# Patient Record
Sex: Female | Born: 1945 | Race: White | Hispanic: No | Smoking: Former smoker
Health system: Southern US, Community
[De-identification: ages and names within clinical notes are randomized; demographics above are authoritative.]

## PROBLEM LIST (undated history)

## (undated) DIAGNOSIS — E785 Hyperlipidemia, unspecified: Secondary | ICD-10-CM

## (undated) DIAGNOSIS — I480 Paroxysmal atrial fibrillation: Secondary | ICD-10-CM

## (undated) DIAGNOSIS — I509 Heart failure, unspecified: Secondary | ICD-10-CM

## (undated) DIAGNOSIS — I4891 Unspecified atrial fibrillation: Secondary | ICD-10-CM

## (undated) DIAGNOSIS — Q231 Congenital insufficiency of aortic valve: Secondary | ICD-10-CM

## (undated) DIAGNOSIS — S02109A Fracture of base of skull, unspecified side, initial encounter for closed fracture: Secondary | ICD-10-CM

## (undated) DIAGNOSIS — I350 Nonrheumatic aortic (valve) stenosis: Secondary | ICD-10-CM

## (undated) DIAGNOSIS — R10811 Right upper quadrant abdominal tenderness: Secondary | ICD-10-CM

## (undated) DIAGNOSIS — Z9889 Other specified postprocedural states: Secondary | ICD-10-CM

## (undated) DIAGNOSIS — J690 Pneumonitis due to inhalation of food and vomit: Secondary | ICD-10-CM

## (undated) DIAGNOSIS — E669 Obesity, unspecified: Secondary | ICD-10-CM

## (undated) DIAGNOSIS — Q230 Congenital stenosis of aortic valve: Secondary | ICD-10-CM

## (undated) DIAGNOSIS — J449 Chronic obstructive pulmonary disease, unspecified: Secondary | ICD-10-CM

## (undated) DIAGNOSIS — Z8679 Personal history of other diseases of the circulatory system: Secondary | ICD-10-CM

## (undated) DIAGNOSIS — I10 Essential (primary) hypertension: Secondary | ICD-10-CM

## (undated) DIAGNOSIS — Z9289 Personal history of other medical treatment: Secondary | ICD-10-CM

## (undated) DIAGNOSIS — H9192 Unspecified hearing loss, left ear: Secondary | ICD-10-CM

## (undated) DIAGNOSIS — I4892 Unspecified atrial flutter: Secondary | ICD-10-CM

## (undated) DIAGNOSIS — J9610 Chronic respiratory failure, unspecified whether with hypoxia or hypercapnia: Secondary | ICD-10-CM

## (undated) DIAGNOSIS — Q2381 Bicuspid aortic valve: Secondary | ICD-10-CM

## (undated) DIAGNOSIS — E039 Hypothyroidism, unspecified: Secondary | ICD-10-CM

## (undated) HISTORY — DX: Pneumonitis due to inhalation of food and vomit: J69.0

## (undated) HISTORY — DX: Hypothyroidism, unspecified: E03.9

## (undated) HISTORY — PX: TUMOR EXCISION: SHX421

## (undated) HISTORY — DX: Essential (primary) hypertension: I10

## (undated) HISTORY — DX: Paroxysmal atrial fibrillation: I48.0

## (undated) HISTORY — DX: Chronic respiratory failure, unspecified whether with hypoxia or hypercapnia: J96.10

## (undated) HISTORY — DX: Unspecified atrial fibrillation: I48.91

## (undated) HISTORY — DX: Other specified postprocedural states: Z98.890

## (undated) HISTORY — DX: Unspecified atrial flutter: I48.92

## (undated) HISTORY — DX: Personal history of other medical treatment: Z92.89

## (undated) HISTORY — DX: Heart failure, unspecified: I50.9

## (undated) HISTORY — PX: CARPAL TUNNEL RELEASE: SHX101

## (undated) HISTORY — DX: Right upper quadrant abdominal tenderness: R10.811

## (undated) HISTORY — PX: ABDOMINAL HYSTERECTOMY: SHX81

## (undated) HISTORY — PX: CARDIAC CATHETERIZATION: SHX172

---

## 2005-05-30 ENCOUNTER — Inpatient Hospital Stay: Payer: Self-pay | Admitting: Internal Medicine

## 2005-05-30 ENCOUNTER — Other Ambulatory Visit: Payer: Self-pay

## 2005-05-31 ENCOUNTER — Other Ambulatory Visit: Payer: Self-pay

## 2005-06-02 ENCOUNTER — Other Ambulatory Visit: Payer: Self-pay

## 2005-07-28 ENCOUNTER — Ambulatory Visit: Payer: Self-pay | Admitting: Internal Medicine

## 2005-08-16 ENCOUNTER — Emergency Department: Payer: Self-pay | Admitting: Unknown Physician Specialty

## 2005-08-19 ENCOUNTER — Ambulatory Visit: Payer: Self-pay | Admitting: Internal Medicine

## 2005-09-11 ENCOUNTER — Ambulatory Visit: Payer: Self-pay | Admitting: Internal Medicine

## 2006-02-12 ENCOUNTER — Ambulatory Visit: Payer: Self-pay | Admitting: Internal Medicine

## 2006-02-26 ENCOUNTER — Ambulatory Visit: Payer: Self-pay | Admitting: Unknown Physician Specialty

## 2006-03-05 ENCOUNTER — Ambulatory Visit: Payer: Self-pay

## 2006-04-21 ENCOUNTER — Ambulatory Visit: Payer: Self-pay | Admitting: Unknown Physician Specialty

## 2006-08-13 ENCOUNTER — Emergency Department: Payer: Self-pay | Admitting: Unknown Physician Specialty

## 2006-09-07 DIAGNOSIS — Z8679 Personal history of other diseases of the circulatory system: Secondary | ICD-10-CM

## 2006-09-07 HISTORY — PX: AORTIC VALVE REPLACEMENT: SHX41

## 2006-09-07 HISTORY — DX: Personal history of other diseases of the circulatory system: Z86.79

## 2006-09-07 HISTORY — PX: ABDOMINAL AORTIC ANEURYSM REPAIR: SUR1152

## 2006-10-14 ENCOUNTER — Ambulatory Visit: Payer: Self-pay | Admitting: Unknown Physician Specialty

## 2006-10-25 ENCOUNTER — Emergency Department: Payer: Self-pay | Admitting: Emergency Medicine

## 2007-02-14 ENCOUNTER — Ambulatory Visit: Payer: Self-pay | Admitting: Dermatology

## 2007-03-01 ENCOUNTER — Ambulatory Visit: Payer: Self-pay | Admitting: Internal Medicine

## 2007-06-08 ENCOUNTER — Ambulatory Visit: Payer: Self-pay | Admitting: Internal Medicine

## 2007-06-12 ENCOUNTER — Emergency Department: Payer: Self-pay | Admitting: Emergency Medicine

## 2008-06-12 ENCOUNTER — Encounter: Payer: Self-pay | Admitting: Internal Medicine

## 2008-07-08 ENCOUNTER — Encounter: Payer: Self-pay | Admitting: Internal Medicine

## 2008-08-07 ENCOUNTER — Encounter: Payer: Self-pay | Admitting: Internal Medicine

## 2008-12-15 ENCOUNTER — Emergency Department: Payer: Self-pay | Admitting: Unknown Physician Specialty

## 2010-12-09 ENCOUNTER — Ambulatory Visit: Payer: Self-pay

## 2011-03-30 ENCOUNTER — Ambulatory Visit: Payer: Self-pay | Admitting: Unknown Physician Specialty

## 2011-04-24 ENCOUNTER — Ambulatory Visit: Payer: Self-pay | Admitting: Unknown Physician Specialty

## 2011-04-29 LAB — PATHOLOGY REPORT

## 2011-05-08 ENCOUNTER — Emergency Department: Payer: Self-pay | Admitting: Emergency Medicine

## 2011-10-15 ENCOUNTER — Emergency Department: Payer: Self-pay | Admitting: Unknown Physician Specialty

## 2011-10-16 LAB — COMPREHENSIVE METABOLIC PANEL
Albumin: 3.7 g/dL (ref 3.4–5.0)
Alkaline Phosphatase: 73 U/L (ref 50–136)
Anion Gap: 15 (ref 7–16)
BUN: 13 mg/dL (ref 7–18)
Bilirubin,Total: 0.3 mg/dL (ref 0.2–1.0)
Calcium, Total: 9.2 mg/dL (ref 8.5–10.1)
Chloride: 97 mmol/L — ABNORMAL LOW (ref 98–107)
Co2: 26 mmol/L (ref 21–32)
Creatinine: 0.83 mg/dL (ref 0.60–1.30)
EGFR (African American): >60
EGFR (Non-African Amer.): >60
Glucose: 125 mg/dL — ABNORMAL HIGH (ref 65–99)
Osmolality: 277 (ref 275–301)
Potassium: 3.2 mmol/L — ABNORMAL LOW (ref 3.5–5.1)
SGOT(AST): 19 U/L (ref 15–37)
SGPT (ALT): 21 U/L
Sodium: 138 mmol/L (ref 136–145)
Total Protein: 7.5 g/dL (ref 6.4–8.2)

## 2011-10-16 LAB — CBC
HCT: 33.6 % — ABNORMAL LOW (ref 35.0–47.0)
HGB: 11.2 g/dL — ABNORMAL LOW (ref 12.0–16.0)
MCH: 27 pg (ref 26.0–34.0)
MCHC: 33.3 g/dL (ref 32.0–36.0)
MCV: 81 fL (ref 80–100)
Platelet: 269 10*3/uL (ref 150–440)
RBC: 4.15 10*6/uL (ref 3.80–5.20)
RDW: 17.5 % — ABNORMAL HIGH (ref 11.5–14.5)
WBC: 8.9 10*3/uL (ref 3.6–11.0)

## 2011-10-16 LAB — URINALYSIS, COMPLETE
Bacteria: NONE SEEN
Bilirubin,UR: NEGATIVE
Blood: NEGATIVE
Glucose,UR: NEGATIVE mg/dL (ref 0–75)
Ketone: NEGATIVE
Nitrite: NEGATIVE
Ph: 7 (ref 4.5–8.0)
Protein: NEGATIVE
RBC,UR: 1 /HPF (ref 0–5)
Specific Gravity: 1.009 (ref 1.003–1.030)
Squamous Epithelial: 3
WBC UR: 3 /HPF (ref 0–5)

## 2011-10-16 LAB — LIPASE, BLOOD: Lipase: 124 U/L (ref 73–393)

## 2011-11-06 ENCOUNTER — Ambulatory Visit: Payer: Self-pay | Admitting: Unknown Physician Specialty

## 2012-03-22 ENCOUNTER — Ambulatory Visit: Payer: Self-pay | Admitting: Specialist

## 2012-03-22 LAB — POTASSIUM: Potassium: 3.5 mmol/L (ref 3.5–5.1)

## 2012-03-22 LAB — HEMOGLOBIN: HGB: 11.5 g/dL — ABNORMAL LOW (ref 12.0–16.0)

## 2012-03-30 ENCOUNTER — Ambulatory Visit: Payer: Self-pay | Admitting: Specialist

## 2012-03-31 LAB — PATHOLOGY REPORT

## 2012-05-13 ENCOUNTER — Ambulatory Visit: Payer: Self-pay | Admitting: Internal Medicine

## 2012-06-07 ENCOUNTER — Ambulatory Visit: Payer: Self-pay | Admitting: Internal Medicine

## 2012-06-24 LAB — CBC CANCER CENTER
Basophil #: 0 x10 3/mm (ref 0.0–0.1)
Basophil %: 0.6 %
Eosinophil #: 0.1 x10 3/mm (ref 0.0–0.7)
Eosinophil %: 1.5 %
HCT: 35.4 % (ref 35.0–47.0)
HGB: 11.3 g/dL — ABNORMAL LOW (ref 12.0–16.0)
Lymphocyte #: 1.9 x10 3/mm (ref 1.0–3.6)
Lymphocyte %: 27.4 %
MCH: 26.2 pg (ref 26.0–34.0)
MCHC: 31.9 g/dL — ABNORMAL LOW (ref 32.0–36.0)
MCV: 82 fL (ref 80–100)
Monocyte #: 0.6 x10 3/mm (ref 0.2–0.9)
Monocyte %: 9.2 %
Neutrophil #: 4.2 x10 3/mm (ref 1.4–6.5)
Neutrophil %: 61.3 %
Platelet: 253 x10 3/mm (ref 150–440)
RBC: 4.3 10*6/uL (ref 3.80–5.20)
RDW: 17.2 % — ABNORMAL HIGH (ref 11.5–14.5)
WBC: 6.8 x10 3/mm (ref 3.6–11.0)

## 2012-06-24 LAB — IRON AND TIBC
Iron Bind.Cap.(Total): 396 ug/dL
Iron Saturation: 15 %
Iron: 60 ug/dL
Unbound Iron-Bind.Cap.: 336 ug/dL

## 2012-06-24 LAB — RETICULOCYTES
Absolute Retic Count: 0.0953 x10 6/uL
Reticulocyte: 2.21 % — ABNORMAL HIGH

## 2012-06-24 LAB — FERRITIN: Ferritin (ARMC): 31 ng/mL

## 2012-07-08 ENCOUNTER — Ambulatory Visit: Payer: Self-pay | Admitting: Internal Medicine

## 2012-07-08 ENCOUNTER — Ambulatory Visit: Payer: Self-pay

## 2012-08-14 ENCOUNTER — Emergency Department: Payer: Self-pay | Admitting: Internal Medicine

## 2012-08-14 LAB — COMPREHENSIVE METABOLIC PANEL
Albumin: 3.5 g/dL (ref 3.4–5.0)
Alkaline Phosphatase: 97 U/L (ref 50–136)
Anion Gap: 8 (ref 7–16)
BUN: 8 mg/dL (ref 7–18)
Bilirubin,Total: 0.3 mg/dL (ref 0.2–1.0)
Calcium, Total: 8.8 mg/dL (ref 8.5–10.1)
Chloride: 102 mmol/L (ref 98–107)
Co2: 28 mmol/L (ref 21–32)
Creatinine: 0.72 mg/dL (ref 0.60–1.30)
EGFR (African American): >60
EGFR (Non-African Amer.): >60
Glucose: 74 mg/dL (ref 65–99)
Osmolality: 273 (ref 275–301)
Potassium: 3.3 mmol/L — ABNORMAL LOW (ref 3.5–5.1)
SGOT(AST): 21 U/L (ref 15–37)
SGPT (ALT): 20 U/L (ref 12–78)
Sodium: 138 mmol/L (ref 136–145)
Total Protein: 7.1 g/dL (ref 6.4–8.2)

## 2012-08-14 LAB — CBC
HCT: 33.6 % — ABNORMAL LOW (ref 35.0–47.0)
HGB: 11.2 g/dL — ABNORMAL LOW (ref 12.0–16.0)
MCH: 27.4 pg (ref 26.0–34.0)
MCHC: 33.5 g/dL (ref 32.0–36.0)
MCV: 82 fL (ref 80–100)
Platelet: 231 10*3/uL (ref 150–440)
RBC: 4.1 10*6/uL (ref 3.80–5.20)
RDW: 17.5 % — ABNORMAL HIGH (ref 11.5–14.5)
WBC: 7.8 10*3/uL (ref 3.6–11.0)

## 2012-08-14 LAB — TROPONIN I: Troponin-I: <0.02 ng/mL

## 2012-08-14 LAB — CK TOTAL AND CKMB (NOT AT ARMC)
CK, Total: 107 U/L (ref 21–215)
CK-MB: 0.7 ng/mL (ref 0.5–3.6)

## 2013-06-23 DIAGNOSIS — I7121 Aneurysm of the ascending aorta, without rupture: Secondary | ICD-10-CM | POA: Insufficient documentation

## 2013-06-23 DIAGNOSIS — I712 Thoracic aortic aneurysm, without rupture: Secondary | ICD-10-CM | POA: Insufficient documentation

## 2013-06-23 DIAGNOSIS — Q231 Congenital insufficiency of aortic valve: Secondary | ICD-10-CM | POA: Insufficient documentation

## 2013-07-25 ENCOUNTER — Encounter: Payer: Self-pay | Admitting: Internal Medicine

## 2013-08-07 ENCOUNTER — Encounter: Payer: Self-pay | Admitting: Internal Medicine

## 2013-09-07 ENCOUNTER — Encounter: Payer: Self-pay | Admitting: Internal Medicine

## 2014-02-13 DIAGNOSIS — L0291 Cutaneous abscess, unspecified: Secondary | ICD-10-CM | POA: Insufficient documentation

## 2014-02-13 DIAGNOSIS — A4902 Methicillin resistant Staphylococcus aureus infection, unspecified site: Secondary | ICD-10-CM | POA: Insufficient documentation

## 2014-02-13 DIAGNOSIS — L0390 Cellulitis, unspecified: Secondary | ICD-10-CM

## 2014-04-23 ENCOUNTER — Ambulatory Visit: Payer: Self-pay

## 2014-05-18 DIAGNOSIS — M7542 Impingement syndrome of left shoulder: Secondary | ICD-10-CM | POA: Insufficient documentation

## 2014-05-18 DIAGNOSIS — M5032 Other cervical disc degeneration, mid-cervical region, unspecified level: Secondary | ICD-10-CM | POA: Insufficient documentation

## 2014-05-18 DIAGNOSIS — M7540 Impingement syndrome of unspecified shoulder: Secondary | ICD-10-CM | POA: Insufficient documentation

## 2014-12-25 NOTE — Op Note (Signed)
PATIENT NAME:  [name], [name] MR#:  [id number] DATE OF BIRTH:  1945/12/17  DATE OF PROCEDURE:  03/30/2012  PREOPERATIVE DIAGNOSIS: Mass distal volar left forearm.   POSTOPERATIVE DIAGNOSIS: Mass distal volar left forearm, scar tissue surrounding median nerve.   PROCEDURES:  1. Excision of mass, distal volar left forearm.  2. Neurolysis of the median nerve.   SURGEON: Christophe Louis, M.D.   ANESTHESIA: General.   COMPLICATIONS: None.   TOURNIQUET TIME: Approximately 60 minutes.   DESCRIPTION OF PROCEDURE: One gram of Ancef was given intravenously prior to the procedure. General anesthesia is induced. The left upper extremity is thoroughly prepped with alcohol and ChloraPrep and draped in standard sterile fashion. The extremity is wrapped out with the Esmarch bandage and pneumatic tourniquet elevated to 250 mmHg. Under loupe magnification, the original incision in the volar distal left forearm is extended distally and the dissection very carefully carried down to the median nerve. There is seen to be some scarring about the median nerve and this is completely released under direct vision. The mass is mainly seen to be fatty type tissue and I cannot see any distinct evidence of residual tumor. This is all completely excised and sent to pathology. The incision is then extended proximally and some scar tissue is removed and sent to pathology. Careful palpation and visual inspection demonstrates no evidence of any residual pathology. The wound is thoroughly irrigated multiple times. Skin edges are infiltrated with 0.5% plain Marcaine. The skin is closed with a running      subcuticular 3-0 Prolene. A soft bulky dressing with a volar splint is applied. The tourniquet is released, and the patient is returned to the recovery room in satisfactory condition having tolerated the procedure quite well.  ____________________________ Lucas Mallow, MD ces:slb D: 03/30/2012 08:54:58  ET T: 03/30/2012 10:01:23 ET JOB#: [id number]  cc: Lucas Mallow, MD, <Dictator> Brendolyn Patty, MD [name], MD Lucas Mallow MD ELECTRONICALLY SIGNED 03/31/2012 14:16

## 2015-01-10 ENCOUNTER — Inpatient Hospital Stay
Admission: EM | Admit: 2015-01-10 | Discharge: 2015-01-11 | DRG: 309 | Disposition: A | Discharge status: Home / Self Care | Payer: Medicare Other | Attending: Specialist | Admitting: Specialist

## 2015-01-10 ENCOUNTER — Other Ambulatory Visit: Payer: Self-pay

## 2015-01-10 ENCOUNTER — Encounter: Payer: Self-pay | Admitting: *Deleted

## 2015-01-10 DIAGNOSIS — T501X5A Adverse effect of loop [high-ceiling] diuretics, initial encounter: Secondary | ICD-10-CM | POA: Diagnosis present

## 2015-01-10 DIAGNOSIS — Z7982 Long term (current) use of aspirin: Secondary | ICD-10-CM

## 2015-01-10 DIAGNOSIS — J9610 Chronic respiratory failure, unspecified whether with hypoxia or hypercapnia: Secondary | ICD-10-CM | POA: Diagnosis present

## 2015-01-10 DIAGNOSIS — Z888 Allergy status to other drugs, medicaments and biological substances status: Secondary | ICD-10-CM

## 2015-01-10 DIAGNOSIS — Z6835 Body mass index (BMI) 35.0-35.9, adult: Secondary | ICD-10-CM

## 2015-01-10 DIAGNOSIS — F419 Anxiety disorder, unspecified: Secondary | ICD-10-CM | POA: Diagnosis present

## 2015-01-10 DIAGNOSIS — Z953 Presence of xenogenic heart valve: Secondary | ICD-10-CM

## 2015-01-10 DIAGNOSIS — Z87891 Personal history of nicotine dependence: Secondary | ICD-10-CM

## 2015-01-10 DIAGNOSIS — J449 Chronic obstructive pulmonary disease, unspecified: Secondary | ICD-10-CM | POA: Diagnosis present

## 2015-01-10 DIAGNOSIS — E876 Hypokalemia: Secondary | ICD-10-CM | POA: Diagnosis present

## 2015-01-10 DIAGNOSIS — Z881 Allergy status to other antibiotic agents status: Secondary | ICD-10-CM

## 2015-01-10 DIAGNOSIS — L270 Generalized skin eruption due to drugs and medicaments taken internally: Secondary | ICD-10-CM | POA: Diagnosis present

## 2015-01-10 DIAGNOSIS — E039 Hypothyroidism, unspecified: Secondary | ICD-10-CM | POA: Diagnosis present

## 2015-01-10 DIAGNOSIS — E669 Obesity, unspecified: Secondary | ICD-10-CM | POA: Diagnosis present

## 2015-01-10 DIAGNOSIS — E785 Hyperlipidemia, unspecified: Secondary | ICD-10-CM | POA: Diagnosis present

## 2015-01-10 DIAGNOSIS — I10 Essential (primary) hypertension: Secondary | ICD-10-CM | POA: Diagnosis present

## 2015-01-10 DIAGNOSIS — I4891 Unspecified atrial fibrillation: Secondary | ICD-10-CM | POA: Diagnosis not present

## 2015-01-10 DIAGNOSIS — Z951 Presence of aortocoronary bypass graft: Secondary | ICD-10-CM

## 2015-01-10 DIAGNOSIS — H9193 Unspecified hearing loss, bilateral: Secondary | ICD-10-CM | POA: Diagnosis present

## 2015-01-10 DIAGNOSIS — Z8679 Personal history of other diseases of the circulatory system: Secondary | ICD-10-CM

## 2015-01-10 DIAGNOSIS — Z887 Allergy status to serum and vaccine status: Secondary | ICD-10-CM

## 2015-01-10 DIAGNOSIS — Z9981 Dependence on supplemental oxygen: Secondary | ICD-10-CM

## 2015-01-10 HISTORY — DX: Fracture of base of skull, unspecified side, initial encounter for closed fracture: S02.109A

## 2015-01-10 HISTORY — DX: Congenital insufficiency of aortic valve: Q23.1

## 2015-01-10 HISTORY — DX: Chronic obstructive pulmonary disease, unspecified: J44.9

## 2015-01-10 HISTORY — DX: Nonrheumatic aortic (valve) stenosis: I35.0

## 2015-01-10 HISTORY — DX: Hyperlipidemia, unspecified: E78.5

## 2015-01-10 HISTORY — DX: Unspecified hearing loss, left ear: H91.92

## 2015-01-10 HISTORY — DX: Essential (primary) hypertension: I10

## 2015-01-10 HISTORY — DX: Other specified postprocedural states: Z98.890

## 2015-01-10 HISTORY — DX: Personal history of other diseases of the circulatory system: Z86.79

## 2015-01-10 HISTORY — DX: Congenital stenosis of aortic valve: Q23.0

## 2015-01-10 HISTORY — DX: Obesity, unspecified: E66.9

## 2015-01-10 HISTORY — DX: Bicuspid aortic valve: Q23.81

## 2015-01-10 LAB — COMPREHENSIVE METABOLIC PANEL
ALT: 17 U/L (ref 14–54)
AST: 34 U/L (ref 15–41)
Albumin: 4.2 g/dL (ref 3.5–5.0)
Alkaline Phosphatase: 74 U/L (ref 38–126)
Anion gap: 9 (ref 5–15)
BUN: 14 mg/dL (ref 6–20)
CO2: 27 mmol/L (ref 22–32)
Calcium: 9.4 mg/dL (ref 8.9–10.3)
Chloride: 99 mmol/L — ABNORMAL LOW (ref 101–111)
Creatinine, Ser: 0.76 mg/dL (ref 0.44–1.00)
GFR calc Af Amer: >60 mL/min (ref >60)
GFR calc non Af Amer: >60 mL/min (ref >60)
Glucose, Bld: 116 mg/dL — ABNORMAL HIGH (ref 65–99)
Potassium: 4.1 mmol/L (ref 3.5–5.1)
Sodium: 135 mmol/L (ref 135–145)
Total Bilirubin: 1 mg/dL (ref 0.3–1.2)
Total Protein: 7.6 g/dL (ref 6.5–8.1)

## 2015-01-10 LAB — CBC
HCT: 36.7 % (ref 35.0–47.0)
Hemoglobin: 12.1 g/dL (ref 12.0–16.0)
MCH: 27.3 pg (ref 26.0–34.0)
MCHC: 33 g/dL (ref 32.0–36.0)
MCV: 83 fL (ref 80.0–100.0)
Platelets: 319 10*3/uL (ref 150–440)
RBC: 4.43 MIL/uL (ref 3.80–5.20)
RDW: 16.9 % — ABNORMAL HIGH (ref 11.5–14.5)
WBC: 8.9 10*3/uL (ref 3.6–11.0)

## 2015-01-10 MED ORDER — DILTIAZEM HCL 25 MG/5ML IV SOLN
10.0000 mg | Freq: Once | INTRAVENOUS | Status: AC
  Administered 2015-01-10: 10 mg via INTRAVENOUS

## 2015-01-10 MED ORDER — DILTIAZEM HCL 25 MG/5ML IV SOLN
INTRAVENOUS | Status: AC
  Filled 2015-01-10: qty 5

## 2015-01-10 MED ORDER — SODIUM CHLORIDE 0.9 % IV BOLUS (SEPSIS)
500.0000 mL | Freq: Once | INTRAVENOUS | Status: AC
  Administered 2015-01-10: 500 mL via INTRAVENOUS

## 2015-01-10 NOTE — ED Provider Notes (Signed)
[facility] Emergency Department Provider Note    ____________________________________________  Time seen: 10:45 PM  I have reviewed the triage vital signs and the nursing notes.   HISTORY  Chief Complaint Palpitations       HPI [name] is a 69 y.o. female presents with painless palpitations of acute onset at 35 PM. The dictations of remain persistent patient states she can feel the palpitations however it is indeed painless. Patient denies dyspnea patient denies dizziness no headache. Patient also denies any previous episodes of the same. Patient states that the palpitations feel fast however do not feel irregular.     Past Medical History  Diagnosis Date  . COPD (chronic obstructive pulmonary disease)     There are no active problems to display for this patient.   Past Surgical History  Procedure Laterality Date  . Coronary artery bypass graft    . Mitral valve replacement    . Abdominal hysterectomy      Current Outpatient Rx  Name  Route  Sig  Dispense  Refill  . ALPRAZolam (XANAX) 0.25 MG tablet   Oral   Take 0.25 mg by mouth at bedtime as needed for anxiety.         [name] amLODipine (NORVASC) 2.5 MG tablet   Oral   Take 2.5 mg by mouth daily.         [name] aspirin 81 MG tablet   Oral   Take 81 mg by mouth daily.         [name] atorvastatin (LIPITOR) 40 MG tablet   Oral   Take 40 mg by mouth daily.         [name] diltiazem (CARDIZEM CD) 180 MG 24 hr capsule   Oral   Take 180 mg by mouth daily.         . DULoxetine (CYMBALTA) 60 MG capsule   Oral   Take 60 mg by mouth daily.         [name] esomeprazole (NEXIUM) 40 MG capsule   Oral   Take 40 mg by mouth daily at 12 noon.         . loratadine (CLARITIN) 10 MG tablet   Oral   Take 10 mg by mouth daily.         . potassium chloride (K-DUR,KLOR-CON) 10 MEQ tablet   Oral   Take 10 mEq by mouth daily.         . valsartan-hydrochlorothiazide (DIOVAN-HCT) 320-25 MG per  tablet   Oral   Take 1 tablet by mouth daily.           Allergies Benadryl; Lasix; Levaquin; and Meloxicam  History reviewed. No pertinent family history.  Social History History  Substance Use Topics  . Smoking status: Former Research scientist (life sciences)  . Smokeless tobacco: Not on file  . Alcohol Use: No    Review of Systems  Constitutional: Negative for fever. Eyes: Negative for visual changes. ENT: Negative for sore throat. Cardiovascular: Negative for chest pain. Positive for palpitations Respiratory: Negative for shortness of breath. Gastrointestinal: Negative for abdominal pain, vomiting and diarrhea. Genitourinary: Negative for dysuria. Musculoskeletal: Negative for back pain. Skin: Negative for rash. Neurological: Negative for headaches, focal weakness or numbness.   10-point ROS otherwise negative.  ____________________________________________   PHYSICAL EXAM:  VITAL SIGNS: ED Triage Vitals  Enc Vitals Group     BP 01/10/15 2224 136/89 mmHg     Pulse Rate 01/10/15 2224 147     Resp 01/10/15 2224  20     Temp 01/10/15 2229 99.6 F (37.6 C)     Temp Source 01/10/15 2224 Oral     SpO2 01/10/15 2224 95 %     Weight 01/10/15 2224 183 lb (83.008 kg)     Height 01/10/15 2224 5' (1.524 m)     Head Cir --      Peak Flow --      Pain Score --      Pain Loc --      Pain Edu? --      Excl. in [place]? --      Constitutional: Alert and oriented. Well appearing and in no distress. Eyes: Conjunctivae are normal. PERRL. Normal extraocular movements. ENT   Head: Normocephalic and atraumatic.   Nose: No congestion/rhinnorhea.   Mouth/Throat: Mucous membranes are moist.   Neck: No stridor. Hematological/Lymphatic/Immunilogical: No cervical lymphadenopathy. Cardiovascular: Sinus tachycardia. Normal and symmetric distal pulses are present in all extremities. No murmurs, rubs, or gallops. Respiratory: Normal respiratory effort without tachypnea nor retractions. Breath  sounds are clear and equal bilaterally. No wheezes/rales/rhonchi. Gastrointestinal: Soft and nontender. No distention. No abdominal bruits. There is no CVA tenderness. Genitourinary: Deferred Musculoskeletal: Nontender with normal range of motion in all extremities. No joint effusions.  No lower extremity tenderness nor edema. Neurologic:  Normal speech and language. No gross focal neurologic deficits are appreciated. Speech is normal. No gait instability. Skin:  Skin is warm, dry and intact. No rash noted. Psychiatric: Mood and affect are normal. Speech and behavior are normal. Patient exhibits appropriate insight and judgment.  ____________________________________________    LABS (pertinent positives/negatives)  Labs Reviewed  CBC - Abnormal; Notable for the following:    RDW 16.9 (*)    All other components within normal limits  COMPREHENSIVE METABOLIC PANEL - Abnormal; Notable for the following:    Chloride 99 (*)    Glucose, Bld 116 (*)    All other components within normal limits  FIBRIN DERIVATIVES D-DIMER [facility])     ____________________________________________   EKG  ED ECG REPORT   Date: 01/11/2015  EKG Time: 22:40  Rate:147  Rhythm: Sinus tachycardia}  Axis: normal  Intervals:normal   ST&T Change: 1 aVL and ST segment depression V1 elevation.  ____________________________________________    RADIOLOGY   ____________________________________________   PROCEDURES     ____________________________________________   INITIAL IMPRESSION / ASSESSMENT AND PLAN / ED COURSE  Pertinent labs & imaging results that were available during my care of the patient were reviewed by me and considered in my medical decision making (see chart for details).  69year-old female presenting with painless palpitations heart rate 147. Diltiazem 10 mg IV will be given now. Patient's rate improved to 82. Irregular irregular rhythm noted on the monitor as such EKG performed  revealing atrial fibrillation. As such patient received Lovenox 1 mg/kg. Subcutaneous. Patient discussed with Dr. Marcille Blanco for admission.  ____________________________________________   FINAL CLINICAL IMPRESSION(S) / ED DIAGNOSES  Final diagnoses:  New onset atrial fibrillation     Gregor Hams, MD 01/11/15 0110

## 2015-01-10 NOTE — ED Notes (Signed)
Pt arrives with complaints of palpatations, pt states she was sitting in her chair and felt her heart racing, pt on home o2 2L [place], pt asymptomatic stating she felt fine when she felt her heart race [name], Evelina Bucy, RN

## 2015-01-11 ENCOUNTER — Inpatient Hospital Stay: Payer: Medicare Other

## 2015-01-11 ENCOUNTER — Inpatient Hospital Stay (HOSPITAL_COMMUNITY): Payer: Medicare Other

## 2015-01-11 DIAGNOSIS — J9610 Chronic respiratory failure, unspecified whether with hypoxia or hypercapnia: Secondary | ICD-10-CM | POA: Diagnosis present

## 2015-01-11 DIAGNOSIS — E785 Hyperlipidemia, unspecified: Secondary | ICD-10-CM | POA: Diagnosis present

## 2015-01-11 DIAGNOSIS — I4891 Unspecified atrial fibrillation: Secondary | ICD-10-CM

## 2015-01-11 DIAGNOSIS — Z951 Presence of aortocoronary bypass graft: Secondary | ICD-10-CM | POA: Diagnosis not present

## 2015-01-11 DIAGNOSIS — Z6835 Body mass index (BMI) 35.0-35.9, adult: Secondary | ICD-10-CM | POA: Diagnosis not present

## 2015-01-11 DIAGNOSIS — Z888 Allergy status to other drugs, medicaments and biological substances status: Secondary | ICD-10-CM | POA: Diagnosis not present

## 2015-01-11 DIAGNOSIS — H9193 Unspecified hearing loss, bilateral: Secondary | ICD-10-CM | POA: Diagnosis present

## 2015-01-11 DIAGNOSIS — E876 Hypokalemia: Secondary | ICD-10-CM | POA: Diagnosis present

## 2015-01-11 DIAGNOSIS — Z7982 Long term (current) use of aspirin: Secondary | ICD-10-CM | POA: Diagnosis not present

## 2015-01-11 DIAGNOSIS — I10 Essential (primary) hypertension: Secondary | ICD-10-CM | POA: Diagnosis present

## 2015-01-11 DIAGNOSIS — J449 Chronic obstructive pulmonary disease, unspecified: Secondary | ICD-10-CM | POA: Diagnosis present

## 2015-01-11 DIAGNOSIS — E039 Hypothyroidism, unspecified: Secondary | ICD-10-CM | POA: Diagnosis present

## 2015-01-11 DIAGNOSIS — Z881 Allergy status to other antibiotic agents status: Secondary | ICD-10-CM | POA: Diagnosis not present

## 2015-01-11 DIAGNOSIS — F419 Anxiety disorder, unspecified: Secondary | ICD-10-CM | POA: Diagnosis present

## 2015-01-11 DIAGNOSIS — Z87891 Personal history of nicotine dependence: Secondary | ICD-10-CM | POA: Diagnosis not present

## 2015-01-11 DIAGNOSIS — T501X5A Adverse effect of loop [high-ceiling] diuretics, initial encounter: Secondary | ICD-10-CM | POA: Diagnosis present

## 2015-01-11 DIAGNOSIS — Z953 Presence of xenogenic heart valve: Secondary | ICD-10-CM | POA: Diagnosis not present

## 2015-01-11 DIAGNOSIS — Z9981 Dependence on supplemental oxygen: Secondary | ICD-10-CM | POA: Diagnosis not present

## 2015-01-11 DIAGNOSIS — L270 Generalized skin eruption due to drugs and medicaments taken internally: Secondary | ICD-10-CM | POA: Diagnosis present

## 2015-01-11 DIAGNOSIS — Z8679 Personal history of other diseases of the circulatory system: Secondary | ICD-10-CM | POA: Diagnosis not present

## 2015-01-11 DIAGNOSIS — Z887 Allergy status to serum and vaccine status: Secondary | ICD-10-CM | POA: Diagnosis not present

## 2015-01-11 DIAGNOSIS — E669 Obesity, unspecified: Secondary | ICD-10-CM | POA: Diagnosis present

## 2015-01-11 HISTORY — DX: Unspecified atrial fibrillation: I48.91

## 2015-01-11 LAB — BASIC METABOLIC PANEL
Anion gap: 11 (ref 5–15)
BUN: 14 mg/dL (ref 6–20)
CO2: 30 mmol/L (ref 22–32)
Calcium: 9.3 mg/dL (ref 8.9–10.3)
Chloride: 99 mmol/L — ABNORMAL LOW (ref 101–111)
Creatinine, Ser: 0.66 mg/dL (ref 0.44–1.00)
GFR calc Af Amer: >60 mL/min (ref >60)
GFR calc non Af Amer: >60 mL/min (ref >60)
Glucose, Bld: 110 mg/dL — ABNORMAL HIGH (ref 65–99)
Potassium: 3 mmol/L — ABNORMAL LOW (ref 3.5–5.1)
Sodium: 140 mmol/L (ref 135–145)

## 2015-01-11 LAB — CBC
HCT: 37.1 % (ref 35.0–47.0)
Hemoglobin: 12.2 g/dL (ref 12.0–16.0)
MCH: 27.5 pg (ref 26.0–34.0)
MCHC: 32.9 g/dL (ref 32.0–36.0)
MCV: 83.5 fL (ref 80.0–100.0)
Platelets: 265 10*3/uL (ref 150–440)
RBC: 4.45 MIL/uL (ref 3.80–5.20)
RDW: 16.7 % — ABNORMAL HIGH (ref 11.5–14.5)
WBC: 7 10*3/uL (ref 3.6–11.0)

## 2015-01-11 LAB — MAGNESIUM: Magnesium: 1.8 mg/dL (ref 1.7–2.4)

## 2015-01-11 LAB — TSH: TSH: 6.228 u[IU]/mL — ABNORMAL HIGH (ref 0.350–4.500)

## 2015-01-11 LAB — FIBRIN DERIVATIVES D-DIMER (ARMC ONLY): Fibrin derivatives D-dimer (ARMC): 657

## 2015-01-11 MED ORDER — APIXABAN 5 MG PO TABS: 5.0000 mg | ORAL_TABLET | Freq: Two times a day (BID) | ORAL | Status: DC

## 2015-01-11 MED ORDER — MOMETASONE FURO-FORMOTEROL FUM 200-5 MCG/ACT IN AERO
2.0000 | INHALATION_SPRAY | Freq: Two times a day (BID) | RESPIRATORY_TRACT | Status: DC
  Administered 2015-01-11: 2 via RESPIRATORY_TRACT
  Filled 2015-01-11: qty 8.8

## 2015-01-11 MED ORDER — OMEGA-3-ACID ETHYL ESTERS 1 G PO CAPS
1.0000 g | ORAL_CAPSULE | Freq: Every day | ORAL | Status: DC
  Administered 2015-01-11: 1 g via ORAL
  Filled 2015-01-11: qty 1

## 2015-01-11 MED ORDER — VALSARTAN-HYDROCHLOROTHIAZIDE 320-25 MG PO TABS: 1.0000 | ORAL_TABLET | Freq: Every day | ORAL | Status: DC

## 2015-01-11 MED ORDER — DULOXETINE HCL 60 MG PO CPEP
60.0000 mg | ORAL_CAPSULE | Freq: Every day | ORAL | Status: DC
  Administered 2015-01-11: 60 mg via ORAL
  Filled 2015-01-11: qty 1

## 2015-01-11 MED ORDER — LEVOTHYROXINE SODIUM 25 MCG PO TABS
25.0000 ug | ORAL_TABLET | Freq: Every day | ORAL | Status: DC
  Administered 2015-01-11: 25 ug via ORAL
  Filled 2015-01-11: qty 1

## 2015-01-11 MED ORDER — ALPRAZOLAM 0.5 MG PO TABS: 0.5000 mg | ORAL_TABLET | Freq: Every evening | ORAL | Status: DC | PRN

## 2015-01-11 MED ORDER — MAGNESIUM OXIDE 400 (241.3 MG) MG PO TABS: 200.0000 mg | ORAL_TABLET | Freq: Every day | ORAL | Status: DC

## 2015-01-11 MED ORDER — POTASSIUM CHLORIDE CRYS ER 10 MEQ PO TBCR: 10.0000 meq | EXTENDED_RELEASE_TABLET | Freq: Every day | ORAL | Status: DC

## 2015-01-11 MED ORDER — VALSARTAN 160 MG PO TABS
320.0000 mg | ORAL_TABLET | Freq: Every day | ORAL | Status: DC
  Administered 2015-01-11: 320 mg via ORAL
  Filled 2015-01-11 (×3): qty 2

## 2015-01-11 MED ORDER — POTASSIUM CHLORIDE CRYS ER 20 MEQ PO TBCR
20.0000 meq | EXTENDED_RELEASE_TABLET | Freq: Two times a day (BID) | ORAL | Status: DC
  Administered 2015-01-11: 20 meq via ORAL
  Filled 2015-01-11: qty 1

## 2015-01-11 MED ORDER — ENOXAPARIN SODIUM 100 MG/ML ~~LOC~~ SOLN
SUBCUTANEOUS | Status: AC
  Filled 2015-01-11: qty 1

## 2015-01-11 MED ORDER — DILTIAZEM HCL ER COATED BEADS 180 MG PO CP24
180.0000 mg | ORAL_CAPSULE | Freq: Once | ORAL | Status: AC
  Administered 2015-01-11: 180 mg via ORAL
  Filled 2015-01-11: qty 1

## 2015-01-11 MED ORDER — DILTIAZEM HCL ER COATED BEADS 240 MG PO CP24: 240.0000 mg | ORAL_CAPSULE | Freq: Every day | ORAL | Status: DC

## 2015-01-11 MED ORDER — MAGNESIUM 250 MG PO TABS: 250.0000 mg | ORAL_TABLET | Freq: Every day | ORAL | Status: DC

## 2015-01-11 MED ORDER — ALBUTEROL SULFATE (2.5 MG/3ML) 0.083% IN NEBU: 2.5000 mg | INHALATION_SOLUTION | RESPIRATORY_TRACT | Status: DC | PRN

## 2015-01-11 MED ORDER — ASPIRIN EC 325 MG PO TBEC
325.0000 mg | DELAYED_RELEASE_TABLET | Freq: Every day | ORAL | Status: DC
  Administered 2015-01-11: 325 mg via ORAL
  Filled 2015-01-11: qty 1

## 2015-01-11 MED ORDER — HEPARIN SODIUM (PORCINE) 5000 UNIT/ML IJ SOLN
5000.0000 [IU] | Freq: Three times a day (TID) | INTRAMUSCULAR | Status: DC
  Administered 2015-01-11: 5000 [IU] via SUBCUTANEOUS
  Filled 2015-01-11: qty 1

## 2015-01-11 MED ORDER — COENZYME Q10 30 MG PO CAPS: 100.0000 mg | ORAL_CAPSULE | Freq: Every day | ORAL | Status: DC

## 2015-01-11 MED ORDER — ATORVASTATIN CALCIUM 20 MG PO TABS
40.0000 mg | ORAL_TABLET | Freq: Every day | ORAL | Status: DC
  Administered 2015-01-11: 40 mg via ORAL
  Filled 2015-01-11: qty 2

## 2015-01-11 MED ORDER — ENOXAPARIN SODIUM 100 MG/ML ~~LOC~~ SOLN
1.0000 mg/kg | Freq: Once | SUBCUTANEOUS | Status: AC
  Administered 2015-01-11: 85 mg via SUBCUTANEOUS

## 2015-01-11 MED ORDER — LORATADINE 10 MG PO TABS
10.0000 mg | ORAL_TABLET | Freq: Every day | ORAL | Status: DC
  Administered 2015-01-11: 10 mg via ORAL
  Filled 2015-01-11: qty 1

## 2015-01-11 MED ORDER — ADULT MULTIVITAMIN W/MINERALS CH
1.0000 | ORAL_TABLET | Freq: Every day | ORAL | Status: DC
  Administered 2015-01-11: 1 via ORAL
  Filled 2015-01-11 (×3): qty 1

## 2015-01-11 MED ORDER — ACETAMINOPHEN 325 MG PO TABS: 650.0000 mg | ORAL_TABLET | Freq: Four times a day (QID) | ORAL | Status: DC | PRN

## 2015-01-11 MED ORDER — MULTIVITAMIN ADULTS 50+ PO TABS: 1.0000 | ORAL_TABLET | Freq: Every day | ORAL | Status: DC

## 2015-01-11 MED ORDER — HYDROCHLOROTHIAZIDE 25 MG PO TABS
25.0000 mg | ORAL_TABLET | Freq: Every day | ORAL | Status: DC
  Administered 2015-01-11: 25 mg via ORAL
  Filled 2015-01-11: qty 1

## 2015-01-11 MED ORDER — DILTIAZEM HCL ER COATED BEADS 180 MG PO CP24
180.0000 mg | ORAL_CAPSULE | Freq: Every day | ORAL | Status: DC
  Filled 2015-01-11: qty 1

## 2015-01-11 MED ORDER — ACETAMINOPHEN 650 MG RE SUPP: 650.0000 mg | Freq: Four times a day (QID) | RECTAL | Status: DC | PRN

## 2015-01-11 MED ORDER — DILTIAZEM HCL 25 MG/5ML IV SOLN
5.0000 mg | Freq: Once | INTRAVENOUS | Status: AC
  Administered 2015-01-11: 5 mg via INTRAVENOUS

## 2015-01-11 MED ORDER — FERROUS SULFATE 325 (65 FE) MG PO TABS
325.0000 mg | ORAL_TABLET | Freq: Every day | ORAL | Status: DC
Start: 2015-01-11 — End: 2015-01-11
  Filled 2015-01-11: qty 1

## 2015-01-11 NOTE — Progress Notes (Signed)
Pt taken to main entrance via wheelchair with nurse aide to meet family member outside in car. Patients portable o2 in use.

## 2015-01-11 NOTE — Progress Notes (Signed)
Pt. Discharged to home. Discharge instructions and medication regimen reviewed at bedside with patient. Pt. verbalizes understanding of instructions and medication regimen. Prescriptions given to patient to take to pharmacy. Patient assessment unchanged from this morning. Currently awaiting a ride home from family member. Until ETA established, Tele and IV still in place.

## 2015-01-11 NOTE — ED Notes (Signed)
Called pharmacy to send to cardizem CD

## 2015-01-11 NOTE — Discharge Summary (Addendum)
[place] [facility] [place] NAME: [name]    MR#:  [id number]  [place]:  1946/06/27  DATE OF ADMISSION:  01/10/2015 ADMITTING PHYSICIAN: Harrie Foreman, MD  DATE OF DISCHARGE: 01/11/2015  PRIMARY CARE PHYSICIAN: Clayborn Bigness    ADMISSION DIAGNOSIS:  New onset atrial fibrillation [I48.91]  DISCHARGE DIAGNOSIS:  Active Problems:   Atrial fibrillation with RVR   SECONDARY DIAGNOSIS:   Past Medical History  Diagnosis Date  . COPD (chronic obstructive pulmonary disease)     a. on home O2 at 2L since 2008  . HLD (hyperlipidemia)   . HTN (hypertension)   . HLD (hyperlipidemia)   . 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 Basal skull fracture 20 yrs ago  . Aortic stenosis due to bicuspid aortic valve     a. s/p bioprosthetic valve replacement 2008 at [facility]  . S/P ascending aortic aneurysm repair 2008  . Obesity     HOSPITAL COURSE:   69 year old female with past medical history of COPD hypertension hyperlipidemia presented to the hospital due to palpitations and noted to be in new onset atrial fibrillation  New onset fibrillation-this was the cause of patient's palpitations on admission patient was given pulse doses of IV Cardizem and heart rate improved and patient has not converted to normal sinus rhythm cardiology consult was obtained who recommended increasing his Cardizem dose from 180 mg to 240 mg daily. Echo was done but results are still pending and can be followed up by cardiology as outpatient.  Patient was only on aspirin prior to coming in and was changed to Eliquis prior to discharge  Hypertension-patient's antihypertensive meds were adjusted as the patient's blood pressure was borderline low. Patient was taken off the Norvasc and valsartan and hydrochlorothiazide and her Cardizem dose was increased.  Hyperlipidemia-patient was maintained on the atorvastatin she will continue  that.  Hypothyroidism-patient was maintained on her Synthroid she will resume that.  COPD-patient had no acute exacerbation was maintained on some as needed duo nebs she will resume her inhalers upon discharge.  Anxiety-patient was making her Xanax and she will also resume that upon discharge.   DISCHARGE CONDITIONS:   Stable.   CONSULTS OBTAINED:  Treatment Team:  [name], MD Minna Merritts, MD  DRUG ALLERGIES:   Allergies  Allergen Reactions  . Levaquin [Levofloxacin In D5w] Other (See Comments)    Reaction:  Fatigue and muscle soreness  . Sulfa Antibiotics Other (See Comments)    Reaction:  Unknown  . Benadryl [Diphenhydramine Hcl (Sleep)] Palpitations  . Lasix [Furosemide] Rash  . Meloxicam Rash    DISCHARGE MEDICATIONS:   Current Discharge Medication List    START taking these medications   Details  apixaban (ELIQUIS) 5 MG TABS tablet Take 1 tablet (5 mg total) by mouth 2 (two) times daily. Qty: 60 tablet, Refills: 1      CONTINUE these medications which have CHANGED   Details  diltiazem (CARDIZEM CD) 240 MG 24 hr capsule Take 1 capsule (240 mg total) by mouth daily. Qty: 30 capsule, Refills: 1      CONTINUE these medications which have NOT CHANGED   Details  albuterol-ipratropium (COMBIVENT) 18-103 MCG/ACT inhaler Inhale 1 puff into the lungs 3 (three) times daily as needed for wheezing or shortness of breath.    ALPRAZolam (XANAX) 0.25 MG tablet Take 0.5 mg by mouth at bedtime as needed  for anxiety.     atorvastatin (LIPITOR) 40 MG tablet Take 40 mg by mouth daily.    beclomethasone (QVAR) 80 MCG/ACT inhaler Inhale 1 puff into the lungs 2 (two) times daily.    co-enzyme Q-10 30 MG capsule Take 100 mg by mouth daily.    DULoxetine (CYMBALTA) 60 MG capsule Take 60 mg by mouth daily.    EQ FIBER SUPPLEMENT PO Take 1 tablet by mouth daily.    esomeprazole (NEXIUM) 40 MG capsule Take 40 mg by mouth daily at 12 noon.    IRON, FERROUS  GLUCONATE, PO Take 2 tablets by mouth daily.    levothyroxine (SYNTHROID, LEVOTHROID) 25 MCG tablet Take 25 mcg by mouth daily before breakfast.    loratadine (CLARITIN) 10 MG tablet Take 10 mg by mouth daily.    Magnesium 250 MG TABS Take 250 mg by mouth daily.    Multiple Vitamins-Minerals (MULTIVITAMIN ADULTS 50+) TABS Take 1 tablet by mouth daily.    Omega-3 Fatty Acids (FISH OIL) 1200 MG CAPS Take 1,200 mg by mouth daily.    potassium chloride (K-DUR,KLOR-CON) 10 MEQ tablet Take 10 mEq by mouth daily.      STOP taking these medications     amLODipine (NORVASC) 2.5 MG tablet      aspirin 81 MG chewable tablet      valsartan-hydrochlorothiazide (DIOVAN-HCT) 320-25 MG per tablet          DISCHARGE INSTRUCTIONS:    DIET:  Cardiac diet  DISCHARGE CONDITION:  Stable  ACTIVITY:  Activity as tolerated  OXYGEN:  Home Oxygen: Yes.     Oxygen Delivery: 2L [place] Oxygen concentrator.   DISCHARGE LOCATION:  home   If you experience worsening of your admission symptoms, develop shortness of breath, life threatening emergency, suicidal or homicidal thoughts you must seek medical attention immediately by calling 911 or calling your MD immediately  if symptoms less severe.  You Must read complete instructions/literature along with all the possible adverse reactions/side effects for all the Medicines you take and that have been prescribed to you. Take any new Medicines after you have completely understood and accpet all the possible adverse reactions/side effects.   Please note  You were cared for by a hospitalist during your hospital stay. If you have any questions about your discharge medications or the care you received while you were in the hospital after you are discharged, you can call the unit and asked to speak with the hospitalist on call if the hospitalist that took care of you is not available. Once you are discharged, your primary care physician will handle any further  medical issues. Please note that NO REFILLS for any discharge medications will be authorized once you are discharged, as it is imperative that you return to your primary care physician (or establish a relationship with a primary care physician if you do not have one) for your aftercare needs so that they can reassess your need for medications and monitor your lab values.      Today   CHIEF COMPLAINT:   Chief Complaint  Patient presents with  . Palpitations    HISTORY OF PRESENT ILLNESS:  [name]  is a 69 y.o. female with a known history of COPD, HTN, Hyperlipidemia, hx of aortic valve replacement (bioprosthetic aortic valve) came into hospital due to palpitations and noted to be in new onset atrial fibrillation.    VITAL SIGNS:  Blood pressure 121/56, pulse 69, temperature 98 F (36.7 C), temperature source Oral,  resp. rate 20, height 5' (1.524 m), weight 81.874 kg (180 lb 8 oz), SpO2 97 %.  I/O:   Intake/Output Summary (Last 24 hours) at 01/11/15 1347 Last data filed at 01/11/15 0830  Gross per 24 hour  Intake    120 ml  Output      0 ml  Net    120 ml    PHYSICAL EXAMINATION:  GENERAL:  69 y.o.-year-old patient lying in the bed with no acute distress.  EYES: Pupils equal, round, reactive to light and accommodation. No scleral icterus. Extraocular muscles intact.  HEENT: Head atraumatic, normocephalic. No oropharyngeal erythema.  Moist oral mucosa  NECK:  Supple, no jugular venous distention. No thyroid enlargement, no tenderness.  LUNGS: Normal breath sounds bilaterally, no wheezing, rales,rhonchi or crepitation. No use of accessory muscles of respiration.  CARDIOVASCULAR: S1, S2 normal. + II/VI Holosystolic mumur at RSB.  ABDOMEN: Soft, non-tender, non-distended. Bowel sounds present. No organomegaly or mass.  EXTREMITIES: No pedal edema, cyanosis, or clubbing.  NEUROLOGIC: Cranial nerves II through XII are intact. No focal motor or sensory defecits b/l PSYCHIATRIC:  The patient is alert and oriented x 3.  SKIN: No obvious rash, lesion, or ulcer.   DATA REVIEW:   CBC  Recent Labs Lab 01/11/15 0409  WBC 7.0  HGB 12.2  HCT 37.1  PLT 265    Chemistries   Recent Labs Lab 01/10/15 2239 01/11/15 0409  NA 135 140  K 4.1 3.0*  CL 99* 99*  CO2 27 30  GLUCOSE 116* 110*  BUN 14 14  CREATININE 0.76 0.66  CALCIUM 9.4 9.3  MG  --  1.8  AST 34  --   ALT 17  --   ALKPHOS 74  --   BILITOT 1.0  --     Cardiac Enzymes No results for input(s): TROPONINI in the last 168 hours.  Microbiology Results  No results found for this or any previous visit.  RADIOLOGY:  No results found.  EKG:   Management plans discussed with the patient, family and they are in agreement.  CODE STATUS: Full code.     Code Status Orders        Start     Ordered   01/11/15 0315  Full code   Continuous     01/11/15 0314    Advance Directive Documentation        Most Recent Value   Type of Advance Directive  Living will   Pre-existing out of facility DNR order (yellow form or pink MOST form)     "MOST" Form in Place?        TOTAL TIME TAKING CARE OF THIS PATIENT: 35 minutes.    Henreitta Leber M.D on 01/11/2015 at 1:47 PM  Between 7am to 6pm - Pager - [phone]  After 6pm go to [email or web address] - password [email or web address] [facility]  Office  [phone]  CC: Primary care physician; Clayborn Bigness Cardiologist, Dr. [name]

## 2015-01-11 NOTE — H&P (Addendum)
[name] is an 69 y.o. female.   Chief Complaint: Palpitations HPI: Patient presents to the emergency department because she felt her heart racing. At home she checked her blood pressure which was slightly elevated but she noticed that her heart rate was 130 to 145. She denies any chest pain, but eventually became slightly dizzy and then felt short of breath. She states this is the first time she's had these symptoms. Her palpitations persisted for approximately 2 hours prior to presentation. After receiving IV diltiazem the patient's heart rate slowed but remained in Afib. Due to the new onset of atrial fibrillation the emergency department called for admission  Past Medical History  Diagnosis Date  . COPD (chronic obstructive pulmonary disease)   . HLD (hyperlipidemia)   . HTN (hypertension)   . HLD (hyperlipidemia)   . 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 Basal skull fracture 20 yrs ago    Past Surgical History  Procedure Laterality Date  . Coronary artery bypass graft    . Aortic valve replacement  2008  . Abdominal hysterectomy    . Carpal tunnel release    . Tumor excision Left     x3 (arm)  . Ventricular resection / repair aneurysm  2008    History reviewed. No pertinent family history. Social History:  reports that she has quit smoking. She does not have any smokeless tobacco history on file. She reports that she does not drink alcohol or use illicit drugs.  Allergies:  Allergies  Allergen Reactions  . Levaquin [Levofloxacin In D5w] Other (See Comments)    Reaction:  Fatigue and muscle soreness  . Sulfa Antibiotics Other (See Comments)    Reaction:  Unknown  . Benadryl [Diphenhydramine Hcl (Sleep)] Palpitations  . Lasix [Furosemide] Rash  . Meloxicam Rash    Medications Prior to Admission  Medication Sig Dispense Refill  . albuterol-ipratropium (COMBIVENT) 18-103 MCG/ACT inhaler Inhale 1 puff into the lungs 3 (three) times daily as  needed for wheezing or shortness of breath.    . ALPRAZolam (XANAX) 0.25 MG tablet Take 0.5 mg by mouth at bedtime as needed for anxiety.     [name] amLODipine (NORVASC) 2.5 MG tablet Take 2.5 mg by mouth daily.    [name] aspirin 81 MG chewable tablet Chew 81 mg by mouth daily.    [name] atorvastatin (LIPITOR) 40 MG tablet Take 40 mg by mouth daily.    . beclomethasone (QVAR) 80 MCG/ACT inhaler Inhale 1 puff into the lungs 2 (two) times daily.    [name] co-enzyme Q-10 30 MG capsule Take 100 mg by mouth daily.    [name] diltiazem (CARDIZEM CD) 180 MG 24 hr capsule Take 180 mg by mouth daily.    . DULoxetine (CYMBALTA) 60 MG capsule Take 60 mg by mouth daily.    Noelle Penner FIBER SUPPLEMENT PO Take 1 tablet by mouth daily.    [name] esomeprazole (NEXIUM) 40 MG capsule Take 40 mg by mouth daily at 12 noon.    . IRON, FERROUS GLUCONATE, PO Take 2 tablets by mouth daily.    [name] levothyroxine (SYNTHROID, LEVOTHROID) 25 MCG tablet Take 25 mcg by mouth daily before breakfast.    . loratadine (CLARITIN) 10 MG tablet Take 10 mg by mouth daily.    . Magnesium 250 MG TABS Take 250 mg by mouth daily.    . Multiple Vitamins-Minerals (MULTIVITAMIN ADULTS 50+) TABS Take 1 tablet by mouth daily.    [name]  Omega-3 Fatty Acids (FISH OIL) 1200 MG CAPS Take 1,200 mg by mouth daily.    . potassium chloride (K-DUR,KLOR-CON) 10 MEQ tablet Take 10 mEq by mouth daily.    . valsartan-hydrochlorothiazide (DIOVAN-HCT) 320-25 MG per tablet Take 1 tablet by mouth daily.      Results for orders placed or performed during the hospital encounter of 01/10/15 (from the past 48 hour(s))  CBC     Status: Abnormal   Collection Time: 01/10/15 10:39 PM  Result Value Ref Range   WBC 8.9 3.6 - 11.0 K/uL   RBC 4.43 3.80 - 5.20 MIL/uL   Hemoglobin 12.1 12.0 - 16.0 g/dL   HCT 36.7 35.0 - 47.0 %   MCV 83.0 80.0 - 100.0 fL   MCH 27.3 26.0 - 34.0 pg   MCHC 33.0 32.0 - 36.0 g/dL   RDW 16.9 (H) 11.5 - 14.5 %   Platelets 319 150 - 440 K/uL  Comprehensive metabolic panel      Status: Abnormal   Collection Time: 01/10/15 10:39 PM  Result Value Ref Range   Sodium 135 135 - 145 mmol/L   Potassium 4.1 3.5 - 5.1 mmol/L    Comment: HEMOLYSIS AT THIS LEVEL MAY AFFECT RESULT   Chloride 99 (L) 101 - 111 mmol/L   CO2 27 22 - 32 mmol/L   Glucose, Bld 116 (H) 65 - 99 mg/dL   BUN 14 6 - 20 mg/dL   Creatinine, Ser 0.76 0.44 - 1.00 mg/dL   Calcium 9.4 8.9 - 10.3 mg/dL   Total Protein 7.6 6.5 - 8.1 g/dL   Albumin 4.2 3.5 - 5.0 g/dL   AST 34 15 - 41 U/L    Comment: HEMOLYSIS AT THIS LEVEL MAY AFFECT RESULT   ALT 17 14 - 54 U/L    Comment: HEMOLYSIS AT THIS LEVEL MAY AFFECT RESULT   Alkaline Phosphatase 74 38 - 126 U/L   Total Bilirubin 1.0 0.3 - 1.2 mg/dL    Comment: HEMOLYSIS AT THIS LEVEL MAY AFFECT RESULT   GFR calc non Af Amer >60 >60 mL/min   GFR calc Af Amer >60 >60 mL/min    Comment: (NOTE) The eGFR has been calculated using the CKD EPI equation. This calculation has not been validated in all clinical situations. eGFR's persistently <60 mL/min signify possible Chronic Kidney Disease.    Anion gap 9 5 - 15  Fibrin derivatives D-Dimer [facility])     Status: None   Collection Time: 01/10/15 10:58 PM  Result Value Ref Range   Fibrin derivatives D-dimer ([facility]) 657    No results found.  Review of Systems  Constitutional: Negative for fever and chills.  HENT: Negative for sore throat and tinnitus.   Eyes: Negative for blurred vision and redness.  Respiratory: Positive for shortness of breath. Negative for cough.        Now resolved  Cardiovascular: Positive for palpitations. Negative for chest pain, orthopnea and PND.  Gastrointestinal: Negative for nausea, vomiting, abdominal pain and diarrhea.  Genitourinary: Negative for dysuria, urgency and frequency.  Musculoskeletal: Negative for myalgias and joint pain (4. Obesity: BMI 35; encourage diet and exercise).  Skin: Negative for rash.       No lesions  Neurological: Positive for dizziness. Negative for speech  change, focal weakness and weakness.       Less than on presentation  Endo/Heme/Allergies: Does not bruise/bleed easily.       No temperature intolerance  Psychiatric/Behavioral: Negative for depression and suicidal ideas.  Blood pressure 108/70, pulse 65, temperature 97.6 F (36.4 C), temperature source Oral, resp. rate 18, height 5' (1.524 m), weight 81.874 kg (180 lb 8 oz), SpO2 100 %. Physical Exam  Nursing note and vitals reviewed. Constitutional: She is oriented to person, place, and time. She appears well-developed and well-nourished.  HENT:  Head: Normocephalic and atraumatic.  Hearing aid in place R ear  Eyes: EOM are normal. Pupils are equal, round, and reactive to light.  Neck: Normal range of motion. No tracheal deviation present. No thyromegaly present.  Cardiovascular: Normal rate, regular rhythm and normal heart sounds.  Exam reveals no gallop and no friction rub.   No murmur heard. Respiratory: Effort normal and breath sounds normal.  GI: Soft. Bowel sounds are normal. She exhibits no distension. There is no tenderness.  Lymphadenopathy:    She has no cervical adenopathy.  Neurological: She is alert and oriented to person, place, and time. No cranial nerve deficit. She exhibits normal muscle tone.  Skin: Skin is warm and dry.  Psychiatric: She has a normal mood and affect. Judgment and thought content normal.     Assessment/Plan This is a 69 year old female admitted for new onset atrial fibrillation. 1. Atrial fibrillation: Originally with rapid ventricular rate. On my examination the patient was back in normal sinus rhythm. Given her history of cardiac surgery she is likely to return to atrial fibrillation at some point in the future and thus needs to be treated for paroxysmal atrial fibrillation. I will continue her oral dose of diltiazem which we will titrate to maintain sinus rhythm. She has been given therapeutic Lovenox by the emergency department which we will  likely switch to an oral anticoagulant. Cardiology consult has been ordered. 2. Hypertension: Controlled. Continue valsartan 3. COPD: Continue ICS/LABA  4. DVT prophylaxis: As above 5. GI prophylaxis: None as the patient is not critically ill 6. Obesity: BMI 35; encourage diet and exercise The patient is a full code. Time spent on admission orders and patient care approximately 45 minutes  Harrie Foreman 01/11/2015, 4:05 AM

## 2015-01-11 NOTE — Progress Notes (Signed)
Provided Eliquis coupon for 30 day freee.  Patient has chronic home 02 and has portable for transport home

## 2015-01-11 NOTE — Discharge Instructions (Addendum)
DIET:  Cardiac diet  DISCHARGE CONDITION:  Stable  ACTIVITY:  Activity as tolerated  OXYGEN:  Home Oxygen: Yes.     Oxygen Delivery: 2L [place] via O2 concentrator.   DISCHARGE LOCATION:  home   If you experience worsening of your admission symptoms, develop shortness of breath, life threatening emergency, suicidal or homicidal thoughts you must seek medical attention immediately by calling 911 or calling your MD immediately  if symptoms less severe.  You Must read complete instructions/literature along with all the possible adverse reactions/side effects for all the Medicines you take and that have been prescribed to you. Take any new Medicines after you have completely understood and accpet all the possible adverse reactions/side effects.   Please note  You were cared for by a hospitalist during your hospital stay. If you have any questions about your discharge medications or the care you received while you were in the hospital after you are discharged, you can call the unit and asked to speak with the hospitalist on call if the hospitalist that took care of you is not available. Once you are discharged, your primary care physician will handle any further medical issues. Please note that NO REFILLS for any discharge medications will be authorized once you are discharged, as it is imperative that you return to your primary care physician (or establish a relationship with a primary care physician if you do not have one) for your aftercare needs so that they can reassess your need for medications and monitor your lab values.   Atrial Fibrillation Atrial fibrillation is a condition that causes your heart to beat irregularly. It may also cause your heart to beat faster than normal. Atrial fibrillation can prevent your heart from pumping blood normally. It increases your risk of stroke and heart problems. HOME CARE  Take medications as told by your doctor.  Only take medications that your  doctor says are safe. Some medications can make the condition worse or happen again.  If blood thinners were prescribed by your doctor, take them exactly as told. Too much can cause bleeding. Too little and you will not have the needed protection against stroke and other problems.  Perform blood tests at home if told by your doctor.  Perform blood tests exactly as told by your doctor.  Do not drink alcohol.  Do not drink beverages with caffeine such as coffee, soda, and some teas.  Maintain a healthy weight.  Do not use diet pills unless your doctor says they are safe. They may make heart problems worse.  Follow diet instructions as told by your doctor.  Exercise regularly as told by your doctor.  Keep all follow-up appointments. GET HELP IF:  You notice a change in the speed, rhythm, or strength of your heartbeat.  You suddenly begin peeing (urinating) more often.  You get tired more easily when moving or exercising. GET HELP RIGHT AWAY IF:   You have chest or belly (abdominal) pain.  You feel sick to your stomach (nauseous).  You are short of breath.  You suddenly have swollen feet and ankles.  You feel dizzy.  You face, arms, or legs feel numb or weak.  There is a change in your vision or speech. MAKE SURE YOU:   Understand these instructions.  Will watch your condition.  Will get help right away if you are not doing well or get worse. Document Released: 06/02/2008 Document Revised: 01/08/2014 Document Reviewed: 10/04/2012 [facility] Patient Information 2015 [place], [place]. This information is  not intended to replace advice given to you by your health care provider. Make sure you discuss any questions you have with your health care provider.  Anticoagulation, Generic Anticoagulants are medicines used to prevent clots from developing in your veins. These medicine are also known as blood thinners. If blood clots are untreated, they could travel to your lungs.  This is called a pulmonary embolus. A blood clot in your lungs can be fatal.  Health care providers often use anticoagulants to prevent clots following surgery. Anticoagulants are also used along with aspirin when the heart is not getting enough blood. Another anticoagulant called warfarin is started 2 to 3 days after a rapid-acting injectable anticoagulant is started. The rapid-acting anticoagulants are usually continued until warfarin has begun to work. Your health care provider will judge this length of time by blood tests known as the prothrombin time (PT) and International Normalization Ratio (INR). This means that your blood is at the necessary and best level to prevent clots. RISKS AND COMPLICATIONS  If you have received recent epidural anesthesia, spinal anesthesia, or a spinal tap while receiving anticoagulants, you are at risk for developing a blood clot in or around the spine. This condition could result in long-term or permanent paralysis.  Because anticoagulants thin your blood, severe bleeding may occur from any tissue or organ. Symptoms of the blood being too thin may include:  Bleeding from the nose or gums that does not stop quickly.  Blood in bowel movements which may appear as bright red, dark, or black tarry stools.  Blood in the urine which may appear as pink, red, or brown urine.  Unusual bruising or bruising easily.  A cut that does not stop bleeding within 10 minutes.  Vomiting blood or continuous nausea for more than 1 day.  Coughing up blood.  Broken blood vessels in your eye (subconjunctival hemorrhage).  Abdominal or back pain with or without flank bruising.  Sudden, severe headache.  Sudden weakness or numbness of the face, arm, or leg, especially on one side of the body.  Sudden confusion.  Trouble speaking (aphasia) or understanding.  Sudden trouble seeing in one or both eyes.  Sudden trouble walking.  Dizziness.  Loss of balance or  coordination.  Vaginal bleeding.  Swelling or pain at an injection site.  Superficial fat tissue death (necrosis) which may cause skin scarring. This is more common in women and may first present as pain in the waist, thighs, or buttocks.  Fever.  Too little anticoagulation continues to allow the risk for blood clots. HOME CARE INSTRUCTIONS   Due to the complications of anticoagulants, it is very important that you take your anticoagulant as directed by your health care provider. Anticoagulants need to be taken exactly as instructed. Be sure you understand all your anticoagulant instructions.  Keep all follow-up appointments with your health care provider as directed. It is very important to keep your appointments. Not keeping appointments could result in a chronic or permanent injury, pain, or disability.  Warfarin. Your health care provider will advise you on the length of treatment (usually 3-6 months, sometimes lifelong).  Take warfarin exactly as directed by your health care provider. It is recommended that you take your warfarin dose at the same time of the day. It is preferred that you take warfarin in the late afternoon. If you have been told to stop taking warfarin, do not resume taking warfarin until directed to do so by your health care provider. Follow your health care  provider's instructions if you accidentally take an extra dose or miss a dose of warfarin. It is very important to take warfarin as directed since bleeding or blood clots could result in chronic or permanent injury, pain, or disability.  Too much and too little warfarin are both dangerous. Too much warfarin increases the risk of bleeding. Too little warfarin continues to allow the risk for blood clots. While taking warfarin, you will need to have regular blood tests to measure your blood clotting time. These blood tests usually include both the prothrombin time (PT) and International Normalized Ratio (INR) tests. The  PT and INR results allow your health care provider to adjust your dose of warfarin. The dose can change for many reasons. It is critically important that you have your PT and INR levels drawn exactly as directed. Your warfarin dose may stay the same or change depending on what the PT and INR results are. Be sure to follow up with your health care provider regarding your PT and INR test results and what your warfarin dosage should be.  Many medicines can interfere with warfarin and affect the PT and INR results. You must tell your health care provider about any and all medicines you take, this includes all vitamins and supplements. Ask your health care provider before taking these. Prescription and over-the-counter medicine consistency is critical to warfarin management. It is important that potential interactions are checked before you start a new medicine. Be especially cautious with aspirin and anti-inflammatory medicines. Ask your health care provider before taking these. Medicines such as antibiotics and acid-reducing medicine can interact with warfarin and can cause an increased warfarin effect. Warfarin can also interfere with the effectiveness of medicines you are taking. Do not take or discontinue any prescribed or over-the-counter medicine except on the advice of your health care provider or pharmacist.  Some vitamins, supplements, and herbal products interfere with the effectiveness of warfarin. Vitamin E may increase the anticoagulant effects of warfarin. Vitamin K may can cause warfarin to be less effective. Do not take or discontinue any vitamin, supplement, or herbal product except on the advice of your health care provider or pharmacist.  Eat what you normally eat and keep the vitamin K content of your diet consistent. Avoid major changes in your diet, or notify your health care provider before changing your diet. Suddenly getting a lot more vitamin K could cause your blood to clot too quickly.  A sudden decrease in vitamin K intake could cause your blood to clot too slowly. These changes in vitamin K intake could lead to dangerous blood clotsor to bleeding. To keep your vitamin K intake consistent, you must be aware of which foods contain moderate or high amounts of vitamin K. Some foods high in vitamin K include spinach, kale, broccoli, cabbage, greens, Brussels sprouts, asparagus, [place], coleslaw, parsley, and green tea. Arrange a visit with a dietitian to answer your questions.  If you have a loss of appetite or get the stomach flu (viral gastroenteritis), talk to your health care provider as soon as possible. A decrease in your normal vitamin K intake can make you more sensitive to your usual dose of warfarin.  Some medical conditions may increase your risk for bleeding while you are taking warfarin. A fever, diarrhea lasting more than a day, worsening heart failure, or worsening liver function are some medical conditions that could affect warfarin. Contact your health care provider if you have any of these medical conditions.  Alcohol can change  the body's ability to handle warfarin. It is best to avoid alcoholic drinks or consume only very small amounts while taking warfarin. Notify your health care provider if you change your alcohol intake. A sudden increase in alcohol use can increase your risk of bleeding. Chronic alcohol use can cause warfarin to be less effective.  Be careful not to cut yourself when using sharp objects or while shaving.  Inform all your health care providers and your dentist that you take an anticoagulant.  Limit physical activities or sports that could result in a fall or cause injury. Avoid contact sports.  Wear medical alert jewelry or carry a medical alert card. SEEK IMMEDIATE MEDICAL CARE IF:  You cough up blood.  You have dark or black stools or there is bright red blood coming from your rectum.  You vomit blood or have nausea for more than 1  day.  You have blood in the urine or pink colored urine.  You have unusual bruising or have increased bruising.  You have bleeding from the nose or gums that does not stop quickly.  You have a cut that does not stop bleeding within a 2-3 minutes.  You have sudden weakness or numbness of the face, arm, or leg, especially on one side of the body.  You have sudden confusion.  You have trouble speaking (aphasia) or understanding.  You have sudden trouble seeing in one or both eyes.  You have sudden trouble walking.  You have dizziness.  You have a loss of balance or coordination.  You have a sudden, severe headache.  You have a serious fall or head injury, even if you are not bleeding.  You have swelling or pain at an injection site.  You have unexplained tenderness or pain in the abdomen, back, waist, thighs or buttocks.  You have a fever. Any of these symptoms may represent a serious problem that is an emergency. Do not wait to see if the symptoms will go away. Get medical help right away. Call your local emergency services (911 in [place] Do not drive yourself to the hospital. Document Released: 08/24/2005 Document Revised: 08/29/2013 Document Reviewed: 03/28/2008 [facility] Patient Information 2015 [place], [place]. This information is not intended to replace advice given to you by your health care provider. Make sure you discuss any questions you have with your health care provider.

## 2015-01-11 NOTE — Consult Note (Signed)
Cardiology Consultation Note  Patient ID: [name], MRN: [id number], DOB/AGE: [birth year] 69 y.o. Admit date: 01/10/2015   Date of Consult: 01/11/2015 Primary Physician: Dr. Humphrey Rolls, MD Primary Cardiologist: [facility]  Chief Complaint: Palpitations Reason for Consult: New onset a-fib with RVR  HPI: 69 y.o. 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    Patient is well known at [facility] for Aorta Surgery and had undergone serial studies back int he mid 2000s to evaluate her aortic valve dysfunction and enlarging ascending aorta. Ultimately, the decision was made to intervene with AVR and graft repair. In 2008 she underwent successful bioprosthetic aortic valve replacement and ascending aortic graft repair. She did not have a cardiac cath just prior to this procedure as she had just had one in 2006 by Dr. Clayborn Bigness that indicated no significant high grade stenosis with medical management recommended. In 2008 her post op course was complicated by acute respiratory failure requiring BiPAP and acute drug reaction 2/2 Lasix vs metoprolol. Dermatology was consulted and felt reaction (rash) was more likely 2/2 Lasix. Since her surgery she has been on O2 chronically at 2L. She does ok with this. She denies any increased SOB, unless she does not have her O2 on. Her weight remains in the range of 180-187. Her last echo was 06/2013 that showed EF >55%, mild LVH, well functioning bioprosthetic valve, and trivial MR/TR. She has not ahd any chest pain over the years. She follows up with her cardiologist at [facility] regularly and just saw them one month ago.   She has been dealing with a URI over the  past 2 week, requiring antibiotics that she just finished this week. Still with some congestion. No increased weight or dyspnea. No chest pain. While sitting at her kitchen table on 5/5 doing a cross-word puzzle she suddenly developed palpitations. She thought her BP was up so took it. She noted her pulse was in the 130s with a BP in the 466Z systolic. She waited 20 minutes and re-took it. Pulse was in the 150s with a pulse in the 993T systolic. She was asymptomatic outside of palpitations. She presented to [facility] ED for further evaluation.  Upon her arrival to [facility] she was found to be in new onset a-fib with RVR. She received IV diltiazem 10 mg x 1 with improvement in rate to the 80s. She remained in rate controlled a-fib. Of note, she takes Cardizem CD 180 mg daily at home. She was admitted for further evaluation. Admission labs showed a K+ of 4.1-->3.0, TSH and Mg have been added. CBC unremarkable. Echo has been ordered. She is currently asymptomatic and resting in her room.     Past Medical History  Diagnosis Date  . COPD (chronic obstructive pulmonary disease)     a. on home O2 at 2L since 2008  . HLD (hyperlipidemia)   . HTN (hypertension)   . HLD (hyperlipidemia)   . 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 Basal skull fracture 20 yrs ago  . Aortic stenosis due to bicuspid aortic valve     a. s/p bioprosthetic  valve replacement 2008 at [facility]  . S/P ascending aortic aneurysm repair 2008  . Obesity       Most Recent Cardiac Studies: Echo 06/2013:  EF >55%, mild LVH, well functioning bioprosthetic aortic valve, trivial MR/TR   Surgical History:  Past Surgical History  Procedure Laterality Date  . Abdominal aortic aneurysm repair  2008  . Aortic valve replacement  2008  . Abdominal hysterectomy    . Carpal tunnel release    . Tumor excision Left     x3 (arm)     Home Meds: Prior to Admission medications   Medication Sig Start Date End Date Taking?  Authorizing Provider  albuterol-ipratropium (COMBIVENT) 18-103 MCG/ACT inhaler Inhale 1 puff into the lungs 3 (three) times daily as needed for wheezing or shortness of breath.   Yes Historical Provider, MD  ALPRAZolam Duanne Moron) 0.25 MG tablet Take 0.5 mg by mouth at bedtime as needed for anxiety.    Yes Historical Provider, MD  amLODipine (NORVASC) 2.5 MG tablet Take 2.5 mg by mouth daily.   Yes Historical Provider, MD  aspirin 81 MG chewable tablet Chew 81 mg by mouth daily.   Yes Historical Provider, MD  atorvastatin (LIPITOR) 40 MG tablet Take 40 mg by mouth daily.   Yes Historical Provider, MD  beclomethasone (QVAR) 80 MCG/ACT inhaler Inhale 1 puff into the lungs 2 (two) times daily.   Yes Historical Provider, MD  co-enzyme Q-10 30 MG capsule Take 100 mg by mouth daily.   Yes Historical Provider, MD  diltiazem (CARDIZEM CD) 180 MG 24 hr capsule Take 180 mg by mouth daily.   Yes Historical Provider, MD  DULoxetine (CYMBALTA) 60 MG capsule Take 60 mg by mouth daily.   Yes Historical Provider, MD  EQ FIBER SUPPLEMENT PO Take 1 tablet by mouth daily.   Yes Historical Provider, MD  esomeprazole (NEXIUM) 40 MG capsule Take 40 mg by mouth daily at 12 noon.   Yes Historical Provider, MD  IRON, FERROUS GLUCONATE, PO Take 2 tablets by mouth daily.   Yes Historical Provider, MD  levothyroxine (SYNTHROID, LEVOTHROID) 25 MCG tablet Take 25 mcg by mouth daily before breakfast.   Yes Historical Provider, MD  loratadine (CLARITIN) 10 MG tablet Take 10 mg by mouth daily.   Yes Historical Provider, MD  Magnesium 250 MG TABS Take 250 mg by mouth daily.   Yes Historical Provider, MD  Multiple Vitamins-Minerals (MULTIVITAMIN ADULTS 50+) TABS Take 1 tablet by mouth daily.   Yes Historical Provider, MD  Omega-3 Fatty Acids (FISH OIL) 1200 MG CAPS Take 1,200 mg by mouth daily.   Yes Historical Provider, MD  potassium chloride (K-DUR,KLOR-CON) 10 MEQ tablet Take 10 mEq by mouth daily.   Yes Historical Provider, MD    valsartan-hydrochlorothiazide (DIOVAN-HCT) 320-25 MG per tablet Take 1 tablet by mouth daily.   Yes Historical Provider, MD    Inpatient Medications:  . aspirin EC  325 mg Oral Daily  . atorvastatin  40 mg Oral Daily  . co-enzyme Q-10  90 mg Oral Daily  . diltiazem  180 mg Oral Daily  . DULoxetine  60 mg Oral Daily  . ferrous sulfate  325 mg Oral Q breakfast  . heparin  5,000 Units Subcutaneous 3 times per day  . valsartan  320 mg Oral Daily   And  . hydrochlorothiazide  25 mg Oral Daily  . levothyroxine  25 mcg Oral QAC breakfast  . loratadine  10 mg Oral Daily  . [START ON 01/12/2015]  magnesium oxide  200 mg Oral Daily  . mometasone-formoterol  2 puff Inhalation BID  . multivitamin with minerals  1 tablet Oral Daily  . omega-3 acid ethyl esters  1 g Oral Daily  . potassium chloride  20 mEq Oral BID      Allergies:  Allergies  Allergen Reactions  . Levaquin [Levofloxacin In D5w] Other (See Comments)    Reaction:  Fatigue and muscle soreness  . Sulfa Antibiotics Other (See Comments)    Reaction:  Unknown  . Benadryl [Diphenhydramine Hcl (Sleep)] Palpitations  . Lasix [Furosemide] Rash  . Meloxicam Rash    History   Social History  . Marital Status: Divorced    Spouse Name: N/A  . Number of Children: N/A  . Years of Education: N/A   Occupational History  . Not on file.   Social History Main Topics  . Smoking status: Former Research scientist (life sciences)  . Smokeless tobacco: Not on file  . Alcohol Use: No  . Drug Use: No  . Sexual Activity: No   Other Topics Concern  . Not on file   Social History Narrative     History reviewed. No pertinent family history.   Review of Systems: Review of Systems  Constitutional: Positive for malaise/fatigue. Negative for fever, chills, weight loss and diaphoresis.  HENT: Positive for congestion, hearing loss and sore throat. Negative for ear discharge, ear pain, nosebleeds and tinnitus.   Eyes: Negative for blurred vision, double vision,  photophobia, pain, discharge and redness.  Respiratory: Positive for cough, sputum production and shortness of breath. Negative for hemoptysis, wheezing and stridor.        Yellow sputum x 1 week  Cardiovascular: Negative for chest pain, palpitations, orthopnea, claudication, leg swelling and PND.  Gastrointestinal: Negative for heartburn, nausea, vomiting, abdominal pain, blood in stool and melena.  Musculoskeletal: Negative for falls.  Skin: Negative for itching and rash.  Neurological: Positive for weakness. Negative for headaches.  Psychiatric/Behavioral: The patient is not nervous/anxious.      Labs: No results for input(s): CKTOTAL, CKMB, TROPONINI in the last 72 hours. Lab Results  Component Value Date   WBC 7.0 01/11/2015   HGB 12.2 01/11/2015   HCT 37.1 01/11/2015   MCV 83.5 01/11/2015   PLT 265 01/11/2015     Recent Labs Lab 01/10/15 2239 01/11/15 0409  NA 135 140  K 4.1 3.0*  CL 99* 99*  CO2 27 30  BUN 14 14  CREATININE 0.76 0.66  CALCIUM 9.4 9.3  PROT 7.6  --   BILITOT 1.0  --   ALKPHOS 74  --   ALT 17  --   AST 34  --   GLUCOSE 116* 110*   No results found for: CHOL, HDL, LDLCALC, TRIG No results found for: DDIMER  Radiology/Studies:  No results found.  EKG: sinus tachycardia 147, TWI I inferolateral st depression. EKG a-fib, 71 bpm, nonspecific st/t changes   Weights: [facility] Weights   01/10/15 2224 01/11/15 0332  Weight: 183 lb (83.008 kg) 180 lb 8 oz (81.874 kg)     Physical Exam: Blood pressure 105/52, pulse 59, temperature 98.2 F (36.8 C), temperature source Oral, resp. rate 18, height 5' (1.524 m), weight 180 lb 8 oz (81.874 kg), SpO2 99 %. Body mass index is 35.25 kg/(m^2). General: Well developed, well nourished, in no acute distress. Head: Normocephalic, atraumatic, sclera non-icteric, no xanthomas, nares are without discharge.  Neck: Negative for carotid bruits. JVD not elevated. Lungs: Clear bilaterally to auscultation without  wheezes, rales, or rhonchi. Breathing is unlabored. Heart: RRR with S1 S2. 1/6 systolic murmur. No rubs or gallops appreciated. Abdomen: Soft, non-tender, non-distended with normoactive bowel sounds. No hepatomegaly. No rebound/guarding. No obvious abdominal masses. Msk:  Strength and tone appear normal for age. Extremities: No clubbing or cyanosis. No edema.  Distal pedal pulses are 2+ and equal bilaterally. Neuro: Alert and oriented X 3. No facial asymmetry. No focal deficit. Moves all extremities spontaneously. Psych:  Responds to questions appropriately with a normal affect.    Assessment and Plan:  1. New onset a-fib with RVR: -Converted to NSR at 12:42 AM and has remained in sinus since with heart rate in the 60s -Recently sick with URI the past 2 weeks, possible predisposing factor, cath 2006 without significant grade stenosis  -Plan for outpatient nuclear stress test to evaluate for high risk ischemia -Increase Cardizem CD to 240 mg daily -Stop Norvasc and valsartan to allow for titration of Cardizem  -Start Eliquis 5 mg bid given CHADSVASc of at least 3 giving her an estimated 3.2% annual stroke risk -Stop aspirin (no role for dual therapy at this time) -Check echo to evaluate LV function, wall motion, evaluate aortic valve, and ascending aorta -Check Mg and TSH -She has a possible drug reaction to metoprolol vs Lasix dating back to 2008  2. History of aortic stenosis s\p bioprosthetic AVR in 2008: -Followed by [facility] -Check echo  3. History of ascending aortic aneurysm s/p graft repair in 2008: -Stable -Followed by [facility]  4. HTN: -Well controlled -Medications adjusted as above  5. COPD with chronic respiratory failure on home O2 at 2L: -Followed by Dr. Chancy Milroy -Stable  6. Hypokalemia: -Replete to 4.0   Signed, Michaelann Gunnoe PA-C 01/11/2015, 9:33 AM

## 2015-01-11 NOTE — Progress Notes (Signed)

## 2015-01-11 NOTE — Care Management Note (Signed)
Case Management Note  Patient Details  Name: [name] MRN: [id number] Date of Birth: 06-14-1946  Subjective/Objective:                    Action/Plan:   Expected Discharge Date:                  Expected Discharge Plan:     In-House Referral:     Discharge planning Services     Post Acute Care Choice:    Choice offered to:     DME Arranged:    DME Agency:     HH Arranged:    [place] Agency:     Status of Service:     Medicare Important Message Given:   yes Date Medicare IM Given:   01/11/15 Medicare IM give by:   Joni Reining Date Additional Medicare IM Given:    Additional Medicare Important Message give by:     If discussed at [place] of Stay Meetings, dates discussed:    Additional Comments:  Katrina Stack, RN 01/11/2015, 8:51 AM

## 2015-01-17 ENCOUNTER — Telehealth: Payer: Self-pay

## 2015-01-17 DIAGNOSIS — Z789 Other specified health status: Secondary | ICD-10-CM

## 2015-01-17 MED ORDER — LISINOPRIL 10 MG PO TABS: 10.0000 mg | ORAL_TABLET | Freq: Every day | ORAL | Status: DC

## 2015-01-17 NOTE — Telephone Encounter (Signed)
Left message for pt to call back  °

## 2015-01-17 NOTE — Telephone Encounter (Signed)
Pt states when she was in the hospital, she was taken off 2 of her BP medication, states her "top #s have been 143 on up to 157". Please call.

## 2015-01-17 NOTE — Telephone Encounter (Signed)
Patient presented with new onset a-fib with RVR on 5/6. Her pressures were somewhat soft at that time and her Norvasc was discontinued with the addition of and titration of Cardizem CD. She was also discontinued on her valsartan/HCTZ given her soft BP with the titration of Cardizem.   She now calls with elevated BP in the 462M-638T systolic.   -Please have the patient start lisinopril 10 mg daily  -Continue Cardizem CD 240 mg daily -She will need follow up bmet at her hospital follow up with the starting of ACEi

## 2015-01-17 NOTE — Telephone Encounter (Signed)
Spoke w/ pt.  Advised her of Ryan's recommendation.  She verbalizes understanding and is agreeable.   She will keep her appt w/ Dr. Fletcher Anon on 01/24/15.

## 2015-01-24 ENCOUNTER — Encounter: Payer: Self-pay | Admitting: Cardiovascular Disease

## 2015-01-24 ENCOUNTER — Ambulatory Visit (INDEPENDENT_AMBULATORY_CARE_PROVIDER_SITE_OTHER): Payer: Medicare Other | Admitting: Cardiovascular Disease

## 2015-01-24 VITALS — BP 128/64 | HR 61 | Ht 60.0 in | Wt 185.0 lb

## 2015-01-24 DIAGNOSIS — I10 Essential (primary) hypertension: Secondary | ICD-10-CM

## 2015-01-24 DIAGNOSIS — I480 Paroxysmal atrial fibrillation: Secondary | ICD-10-CM | POA: Diagnosis not present

## 2015-01-24 DIAGNOSIS — I4891 Unspecified atrial fibrillation: Secondary | ICD-10-CM

## 2015-01-24 DIAGNOSIS — Z954 Presence of other heart-valve replacement: Secondary | ICD-10-CM

## 2015-01-24 DIAGNOSIS — Z789 Other specified health status: Secondary | ICD-10-CM

## 2015-01-24 DIAGNOSIS — Z953 Presence of xenogenic heart valve: Secondary | ICD-10-CM | POA: Insufficient documentation

## 2015-01-24 HISTORY — DX: Paroxysmal atrial fibrillation: I48.0

## 2015-01-24 HISTORY — DX: Essential (primary) hypertension: I10

## 2015-01-24 MED ORDER — LISINOPRIL 20 MG PO TABS: 20.0000 mg | ORAL_TABLET | Freq: Every day | ORAL | Status: DC

## 2015-01-24 NOTE — Assessment & Plan Note (Signed)
Blood pressure seems to be fine today but has been running high at home with associated headache. Thus, I increased the dose of lisinopril the 20 mg once daily. Check  Basic metabolic profile today.

## 2015-01-24 NOTE — Assessment & Plan Note (Signed)
The valve was functioning normally on recent echocardiogram.

## 2015-01-24 NOTE — Patient Instructions (Signed)
Medication Instructions:  Your physician has recommended you make the following change in your medication:  INCREASE lisinopril to '20mg'$  once a day   Labwork: None  Testing/Procedures: None  Follow-Up: Your physician recommends that you schedule a follow-up appointment in [place] with Dr. Fletcher Anon   Any Other Special Instructions Will Be Listed Below (If Applicable).

## 2015-01-24 NOTE — Assessment & Plan Note (Signed)
Continue current dose of diltiazem long-term anticoagulation with Eliquis. She is currently in normal sinus rhythm. If she develops recurrent atrial fibrillation, an antiarrhythmic medication can be considered.

## 2015-01-24 NOTE — Progress Notes (Signed)
Primary care physician: Dr. Clayborn Bigness  HPI   this is a 69 year old female who is here today for a follow-up visit after recent hospitalization for newly diagnosed atrial fibrillation. 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  Cardiac catheterization before valve replacement showed no obstructive coronary artery disease.  She was hospitalized early this month with atrial fibrillation with rapid ventricular response in the setting of upper respiratory tract infection.  She converted to normal sinus rhythm with diltiazem.  Some of her blood pressure medications were discontinued in order to allow up titration of diltiazem to 240 mg once daily. Echocardiogram showed normal LV systolic function with normal functioning of bioprosthetic aortic valve with mildly increased gradient. She was started on anticoagulation with Eliquis.  she has been doing well overall with no recurrent palpitations or chest pain. She has chronic exertional dyspnea related to COPD. She has noticed that her blood pressure has been elevated with associated headache. She called our office and was started on lisinopril 10 mg once daily. She reports improvement in blood pressure but still not optimally controlled at home.  Allergies  Allergen Reactions  . Levaquin [Levofloxacin In D5w] Other (See Comments)    Reaction:  Fatigue and muscle soreness  . Sulfa Antibiotics Other (See Comments)    Reaction:  Unknown  . Benadryl [Diphenhydramine Hcl (Sleep)] Palpitations  . Lasix [Furosemide] Rash  . Meloxicam Rash     Current Outpatient Prescriptions on File Prior to Visit  Medication Sig Dispense Refill  . albuterol-ipratropium (COMBIVENT) 18-103 MCG/ACT inhaler Inhale 1 puff into the lungs 3 (three) times  daily as needed for wheezing or shortness of breath.    . ALPRAZolam (XANAX) 0.25 MG tablet Take 0.5 mg by mouth at bedtime as needed for anxiety.     [name] apixaban (ELIQUIS) 5 MG TABS tablet Take 1 tablet (5 mg total) by mouth 2 (two) times daily. 60 tablet 1  . atorvastatin (LIPITOR) 40 MG tablet Take 40 mg by mouth daily.    . beclomethasone (QVAR) 80 MCG/ACT inhaler Inhale 1 puff into the lungs 2 (two) times daily.    [name] co-enzyme Q-10 30 MG capsule Take 100 mg by mouth daily.    [name] diltiazem (CARDIZEM CD) 240 MG 24 hr capsule Take 1 capsule (240 mg total) by mouth daily. 30 capsule 1  . DULoxetine (CYMBALTA) 60 MG capsule Take 60 mg by mouth daily.    Noelle Penner FIBER SUPPLEMENT PO Take 1 tablet by mouth daily.    [name] esomeprazole (NEXIUM) 40 MG capsule Take 40 mg by mouth daily at 12 noon.    . IRON, FERROUS GLUCONATE, PO Take 2 tablets by mouth daily.    [name] levothyroxine (SYNTHROID, LEVOTHROID) 25 MCG tablet Take 25 mcg by mouth daily before breakfast.    . loratadine (CLARITIN) 10 MG tablet Take 10 mg by mouth daily.    . Multiple Vitamins-Minerals (MULTIVITAMIN ADULTS 50+) TABS Take 1 tablet by mouth daily.    . Omega-3 Fatty Acids (FISH OIL) 1200 MG CAPS Take 1,200 mg by mouth daily.    . potassium chloride (K-DUR,KLOR-CON) 10 MEQ tablet Take 10 mEq by mouth daily.     No current facility-administered medications on file prior to visit.     Past Medical History  Diagnosis Date  . COPD (chronic obstructive pulmonary disease)     a. on home O2 at 2L since 2008  . HLD (hyperlipidemia)   . HTN (hypertension)   . HLD (hyperlipidemia)   . 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 Basal skull fracture 20 yrs ago  . Aortic stenosis due to bicuspid aortic valve     a. s/p bioprosthetic valve replacement 2008 at [facility]  . S/P ascending aortic aneurysm repair 2008  . Obesity   . Thyroid disease      Past Surgical History  Procedure Laterality Date  . Abdominal aortic  aneurysm repair  2008  . Aortic valve replacement  2008  . Abdominal hysterectomy    . Carpal tunnel release    . Tumor excision Left     x3 (arm)  . Cardiac catheterization      [facility]     Family History  Problem Relation Age of Onset  . Stroke Father   . Stroke Paternal Grandmother      History   Social History  . Marital Status: Divorced    Spouse Name: N/A  . Number of Children: N/A  . Years of Education: N/A   Occupational History  . Not on file.   Social History Main Topics  . Smoking status: Former Research scientist (life sciences)  . Smokeless tobacco: Not on file  . Alcohol Use: No  . Drug Use: No  . Sexual Activity: No   Other Topics Concern  . Not on file   Social History Narrative      PHYSICAL EXAM   BP 128/64 mmHg  Pulse 61  Ht 5' (1.524 m)  Wt 185 lb (83.915 kg)  BMI 36.13 kg/m2 Constitutional: She is oriented to person, place, and time. She appears well-developed and well-nourished. No distress.  HENT: No nasal discharge.  Head: Normocephalic and atraumatic.  Eyes: Pupils are equal and round. No discharge.  Neck: Normal range of motion. Neck supple. No JVD present. No thyromegaly present.  Cardiovascular: Normal rate, regular rhythm, normal heart sounds. Exam reveals no gallop and no friction rub.  there is a 2 out of 6 systolic ejection murmur at the aortic area. Pulmonary/Chest: Effort normal and  diminished breath sounds . No stridor. No respiratory distress. She has no wheezes. She has no rales. She exhibits no tenderness.  Abdominal: Soft. Bowel sounds are normal. She exhibits no distension. There is no tenderness. There is no rebound and no guarding.  Musculoskeletal: Normal range of motion. She exhibits no edema and no tenderness.  Neurological: She is alert and oriented to person, place, and time. Coordination normal.  Skin: Skin is warm and dry. No rash noted. She is not diaphoretic. No erythema. No pallor.  Psychiatric: She has a normal mood and affect. Her  behavior is normal. Judgment and thought content normal.     EPP:IRJJO  Rhythm  Low voltage in precordial leads.   ABNORMAL     ASSESSMENT AND PLAN

## 2015-01-25 LAB — BASIC METABOLIC PANEL
BUN/Creatinine Ratio: 13 (ref 11–26)
BUN: 9 mg/dL (ref 8–27)
CO2: 17 mmol/L — ABNORMAL LOW (ref 18–29)
Calcium: 9.6 mg/dL (ref 8.7–10.3)
Chloride: 99 mmol/L (ref 97–108)
Creatinine, Ser: 0.72 mg/dL (ref 0.57–1.00)
GFR calc Af Amer: 100 mL/min/{1.73_m2} (ref >59)
GFR calc non Af Amer: 87 mL/min/{1.73_m2} (ref >59)
Glucose: 128 mg/dL — ABNORMAL HIGH (ref 65–99)
Potassium: 3.9 mmol/L (ref 3.5–5.2)
Sodium: 140 mmol/L (ref 134–144)

## 2015-01-29 ENCOUNTER — Telehealth: Payer: Self-pay | Admitting: Cardiovascular Disease

## 2015-01-29 DIAGNOSIS — K589 Irritable bowel syndrome without diarrhea: Secondary | ICD-10-CM | POA: Insufficient documentation

## 2015-01-29 NOTE — Telephone Encounter (Signed)
Her blood pressure machine might not be accurate. Her blood pressure during last office visit here was normal. We can increase lisinopril to 40 mg once daily but she should come for a blood pressure check in our office in few days and bring the machine with her to compare the readings.

## 2015-01-29 NOTE — Telephone Encounter (Signed)
Pt c/o BP issue: STAT if pt c/o blurred vision, one-sided weakness or slurred speech  1. What are your last 5 BP readings? This morning 142/66 This afternoon 154/75  2. Are you having any other symptoms (ex. Dizziness, headache, blurred vision, passed out)? Headache  3. What is your BP issue? Recent med change bp still consistently high.

## 2015-01-29 NOTE — Telephone Encounter (Signed)
S/w pt who indicates BP remains high (see readings below) Upon d/c from hospital 01/11/15, amlodipine and diovan HCT was discontinued At office visit 5/19, lisinopril was increased to '20mg'$  daily Pt complains of headache and elevated BP.  Forward to [place]. Please advise.

## 2015-01-30 ENCOUNTER — Telehealth: Payer: Self-pay | Admitting: *Deleted

## 2015-01-30 NOTE — Telephone Encounter (Signed)
Left message on machine for patient to contact the office.   

## 2015-01-30 NOTE — Telephone Encounter (Signed)
lmov to see if patient can call and sch nurse visit for BP ck this Friday 02/01/15, per Octavia Heir

## 2015-01-30 NOTE — Telephone Encounter (Signed)
S/w pt regarding increasing lisinopril to '40mg'$  once per day and coming in for nurse visit in a few days. Pt indicates she is sure her BP cuff is accurate. Stated to pt to bring in BP cuff at nurse visit to compare the readings. Pt verbalized understanding with no further questions.

## 2015-02-01 ENCOUNTER — Ambulatory Visit (INDEPENDENT_AMBULATORY_CARE_PROVIDER_SITE_OTHER): Payer: Medicare Other

## 2015-02-01 ENCOUNTER — Telehealth: Payer: Self-pay | Admitting: Cardiovascular Disease

## 2015-02-01 VITALS — BP 152/80 | Resp 16

## 2015-02-01 DIAGNOSIS — I10 Essential (primary) hypertension: Secondary | ICD-10-CM

## 2015-02-01 MED ORDER — LISINOPRIL-HYDROCHLOROTHIAZIDE 20-25 MG PO TABS: 1.0000 | ORAL_TABLET | Freq: Every day | ORAL | Status: DC

## 2015-02-01 NOTE — Patient Instructions (Addendum)
Medication Instructions:  Your physician has recommended you make the following change in your medication: STOP taking lisinopril START taking lisinopril/HCTZ one tablet per day DO NOT take hydralazine   Labwork: Your physician recommends that you return for lab work in one week: BMP    Any Other Special Instructions Will Be Listed Below (If Applicable).

## 2015-02-01 NOTE — Telephone Encounter (Signed)
°  Pt c/o medication issue:  1. Name of Medication: Dicyclomine '10mg'$  PO twice daily with food   2. How are you currently taking this medication (dosage and times per day)? See above  3. Are you having a reaction (difficulty breathing--STAT)? Patient took today and heart started racing 20 minutes later.    4. What is your medication issue? Is above side effect to med.  Does Arida want her to continue med or stop taking  Please call

## 2015-02-01 NOTE — Progress Notes (Signed)
1.) Reason for visit: BP check  2.) Name of MD requesting visit: Arida  3.) H&P: History of elevated BP  4.) ROS related to problem: Pt symptomatic  5.) Assessment and plan per MD: Dr. Fletcher Anon recommends changing lisinopril to lisinopril-HCTZ                    Check BMP in one week

## 2015-02-01 NOTE — Telephone Encounter (Signed)
Spoke w/ pt.  She reports that she called her GI doc and they advised her to d/c this med.

## 2015-02-01 NOTE — Telephone Encounter (Signed)
Dicyclomine can cause palpitations. If she is noting these would hold and contact the prescribing MD for alternative treatment.

## 2015-02-03 ENCOUNTER — Telehealth: Payer: Self-pay | Admitting: Cardiology

## 2015-02-03 NOTE — Telephone Encounter (Signed)
Pt with heart racing at times, she takes her dilt. At bedtime.  She will go ahead and take now, but if she begins to feel weak or SOB she will come to ER.  Her pharmacy is closed until Tuesday.

## 2015-02-05 ENCOUNTER — Telehealth: Payer: Self-pay | Admitting: *Deleted

## 2015-02-05 NOTE — Telephone Encounter (Signed)
Pt is asking what is a good medication for headaches.  Please advise.

## 2015-02-06 NOTE — Telephone Encounter (Signed)
Left message on machine for patient to contact the office.   

## 2015-02-06 NOTE — Telephone Encounter (Signed)
S/w pt who indicated she will take Tylenol for headache. Confirmed lab appt 6/3 at 11:30am

## 2015-02-08 ENCOUNTER — Other Ambulatory Visit (INDEPENDENT_AMBULATORY_CARE_PROVIDER_SITE_OTHER): Payer: Medicare Other

## 2015-02-08 ENCOUNTER — Telehealth: Payer: Self-pay

## 2015-02-08 DIAGNOSIS — I10 Essential (primary) hypertension: Secondary | ICD-10-CM | POA: Diagnosis not present

## 2015-02-08 NOTE — Telephone Encounter (Signed)
Pt states she was in here this morning and had some questions that she needed answered. States she gave them to the person who took her blood. Please call.

## 2015-02-08 NOTE — Telephone Encounter (Signed)
S/w pt regarding list of questions she brought with her to today's lab appointment. Indicated to patient that we would mail information to her regarding afib and heart healthy choices. Pt verbalized understanding with no further questions.

## 2015-02-09 LAB — BASIC METABOLIC PANEL
BUN/Creatinine Ratio: 17 (ref 11–26)
BUN: 11 mg/dL (ref 8–27)
CO2: 27 mmol/L (ref 18–29)
Calcium: 9.4 mg/dL (ref 8.7–10.3)
Chloride: 92 mmol/L — ABNORMAL LOW (ref 97–108)
Creatinine, Ser: 0.65 mg/dL (ref 0.57–1.00)
GFR calc Af Amer: 106 mL/min/{1.73_m2} (ref >59)
GFR calc non Af Amer: 92 mL/min/{1.73_m2} (ref >59)
Glucose: 98 mg/dL (ref 65–99)
Potassium: 3.4 mmol/L — ABNORMAL LOW (ref 3.5–5.2)
Sodium: 135 mmol/L (ref 134–144)

## 2015-02-13 ENCOUNTER — Other Ambulatory Visit: Payer: Self-pay

## 2015-02-13 MED ORDER — POTASSIUM CHLORIDE CRYS ER 20 MEQ PO TBCR: 20.0000 meq | EXTENDED_RELEASE_TABLET | Freq: Every day | ORAL | Status: DC

## 2015-03-08 ENCOUNTER — Other Ambulatory Visit: Payer: Self-pay

## 2015-03-08 MED ORDER — DILTIAZEM HCL ER COATED BEADS 240 MG PO CP24: 240.0000 mg | ORAL_CAPSULE | Freq: Every day | ORAL | Status: DC

## 2015-03-08 NOTE — Telephone Encounter (Signed)
Refill sent for diltiazem

## 2015-03-15 ENCOUNTER — Telehealth: Payer: Self-pay

## 2015-03-15 NOTE — Telephone Encounter (Signed)
Pt states she was prescribed doxycycline. She wants to know if it is ok to take this. Please advise.

## 2015-03-15 NOTE — Telephone Encounter (Signed)
S/w patient who indicated Dr. Nehemiah Massed (dermatologist) gave her prescription in April for doxycycline, '100mg'$  twice per day. Pt states she did not need it at that time but would like to take it now for a boil on her face. States she has taken it before but that was before she started Eliquis. Pt has history of afib. Forward to Dr. Fletcher Anon.

## 2015-03-15 NOTE — Telephone Encounter (Signed)
There is no interaction between doxycycline and Eliquis.

## 2015-03-15 NOTE — Telephone Encounter (Signed)
S/w patient and advised per Dr. Fletcher Anon, no interaction between doxycycline and Eliquis. Pt verbalized understanding with no further questions.

## 2015-03-18 ENCOUNTER — Telehealth: Payer: Self-pay | Admitting: *Deleted

## 2015-03-18 MED ORDER — APIXABAN 5 MG PO TABS: 5.0000 mg | ORAL_TABLET | Freq: Two times a day (BID) | ORAL | Status: DC

## 2015-03-18 NOTE — Telephone Encounter (Signed)
  1. Which medications need to be refilled? Eliquis '5mg'$   2. Which pharmacy is medication to be sent to? [facility]  3. Do they need a 30 day or 90 day supply? 30 day  4. Would they like a call back once the medication has been sent to the pharmacy? No

## 2015-03-18 NOTE — Telephone Encounter (Signed)
Refill sent for eliquis.

## 2015-03-20 ENCOUNTER — Other Ambulatory Visit: Payer: Self-pay

## 2015-03-20 MED ORDER — APIXABAN 5 MG PO TABS: 5.0000 mg | ORAL_TABLET | Freq: Two times a day (BID) | ORAL | Status: DC

## 2015-03-20 NOTE — Telephone Encounter (Signed)
Refill sent for eliquis

## 2015-03-27 ENCOUNTER — Telehealth: Payer: Self-pay | Admitting: *Deleted

## 2015-03-27 ENCOUNTER — Other Ambulatory Visit: Payer: Self-pay

## 2015-03-27 MED ORDER — PROPRANOLOL HCL 10 MG PO TABS: ORAL_TABLET | ORAL | Status: DC

## 2015-03-27 NOTE — Telephone Encounter (Signed)
Pt calling stating that her PCP doctor told her to take Carvidadol  She has taken it now and is confused about medications.  Please advise .

## 2015-03-27 NOTE — Telephone Encounter (Signed)
Pt calling stating her HR is high again it is about 135  And BP is 145/94. Does not have anymore readings. Just took it. She stated last time this happen she called ambulance and they took her to ED . Just wants to know what to do.

## 2015-03-27 NOTE — Telephone Encounter (Signed)
Pt calling just to let us know and had a question on  Propranolol  She just took it and it her HR was 135- and in 25 min it went down to 70  Now she needs to know if she to take this only when HR is up or take it everyday or as directed which is every 30 min  Please advise .

## 2015-03-27 NOTE — Telephone Encounter (Signed)
Agree with ER evaluation if HR is persistantly elevated.  Also , we could prescribe her Propranolol 10 mg PRN. She may take 1 tablet every 30 minutes - up to a max of 4 tablets - for these episodes of paroxysmal tachycardia

## 2015-03-27 NOTE — Telephone Encounter (Signed)
S/w pt who indicates PCP told her to take diltiazem daily which she indicates she has already done today. Pt asks if it is ok to take propranolol since she has already taken diltiazem. Reviewed propranolol instructions with pt who verbalized understanding with no further questions.

## 2015-03-27 NOTE — Telephone Encounter (Signed)
S/w pt. Who states heart has been elevated for about 25 minutes. Reports heart rate of 135. Got up bathroom, couldn't catch breath then felt heart start racing Reports BP 145/94 BP now 152/98, HR 138 HR continues to be elevated at 140bpm Reports this is the way she felt before when she went to ER. States she took morning medications at 8:50am Pt now feels anxious and weak. States [employer] nurse is coming to her home at 10:30am and she wants to wait and see her first then may go to ER if heart rate continues to be elevated.  Advised pt to go to ER if symptoms persist.  Forward to Nahser for review

## 2015-03-27 NOTE — Telephone Encounter (Signed)
S/w pt regarding recommendations and need to go to ER if HR continues to be elevated. Pt asks that I explain it to  [employer], RN with [employer] who is there doing home eval for [employer]. Reviewed recommendations with RN Verbalized understanding with no further questions. Pt states she has pharmacy delivery service and will arrange for prescription to be delivered.

## 2015-03-27 NOTE — Telephone Encounter (Signed)
S/w pt to clarify propranolol instructions. Pt states HR 135, took propranolol and HR reduced to 70. Told pt to not take any more at this time as HR low enough.  Reviewed medication instructions. Pt repeated back to me and verbalized understanding. Thanked for call.

## 2015-04-26 ENCOUNTER — Ambulatory Visit (INDEPENDENT_AMBULATORY_CARE_PROVIDER_SITE_OTHER): Payer: Medicare Other | Admitting: Cardiovascular Disease

## 2015-04-26 ENCOUNTER — Encounter: Payer: Self-pay | Admitting: Cardiovascular Disease

## 2015-04-26 VITALS — BP 120/78 | HR 70 | Ht 60.0 in | Wt 182.5 lb

## 2015-04-26 DIAGNOSIS — I10 Essential (primary) hypertension: Secondary | ICD-10-CM

## 2015-04-26 DIAGNOSIS — Z954 Presence of other heart-valve replacement: Secondary | ICD-10-CM | POA: Diagnosis not present

## 2015-04-26 DIAGNOSIS — I480 Paroxysmal atrial fibrillation: Secondary | ICD-10-CM | POA: Diagnosis not present

## 2015-04-26 DIAGNOSIS — I4891 Unspecified atrial fibrillation: Secondary | ICD-10-CM | POA: Diagnosis not present

## 2015-04-26 DIAGNOSIS — Z953 Presence of xenogenic heart valve: Secondary | ICD-10-CM

## 2015-04-26 NOTE — Patient Instructions (Signed)
Medication Instructions: Continue same medications.   Labwork: None.   Procedures/Testing: None.   Follow-Up: 6 months with Dr. Arida.   Any Additional Special Instructions Will Be Listed Below (If Applicable).   

## 2015-04-26 NOTE — Assessment & Plan Note (Signed)
Blood pressure is well controlled on current medications. 

## 2015-04-26 NOTE — Progress Notes (Signed)
Primary care physician: Dr. Clayborn Bigness  HPI   this is a 69 year old female who is here today for a follow-up visit regarding paroxysmal atrial fibrillation and valvular heart disease.  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  Cardiac catheterization before valve replacement showed no obstructive coronary artery disease.  She was hospitalized in May with atrial fibrillation with rapid ventricular response in the setting of upper respiratory tract infection.  She converted to normal sinus rhythm with diltiazem.   Echocardiogram showed normal LV systolic function with normal functioning of bioprosthetic aortic valve with mildly increased gradient. She was started on anticoagulation with Eliquis. She has been taking diltiazem extended release 240 mg once daily since then. She was given propranolol for breakthrough tachycardia and she had to use this only a few times. Overall she is doing well and denies any chest pain. She continues to be under stress at home as she is taking care of her grandkids.  Allergies  Allergen Reactions  . Levaquin [Levofloxacin In D5w] Other (See Comments)    Reaction:  Fatigue and muscle soreness  . Sulfa Antibiotics Other (See Comments)    Reaction:  Unknown  . Benadryl [Diphenhydramine Hcl (Sleep)] Palpitations  . Lasix [Furosemide] Rash  . Meloxicam Rash     Current Outpatient Prescriptions on File Prior to Visit  Medication Sig Dispense Refill  . albuterol-ipratropium (COMBIVENT) 18-103 MCG/ACT inhaler Inhale 1 puff into the lungs 3 (three) times daily as needed for wheezing or shortness of breath.    . ALPRAZolam (XANAX) 0.25 MG tablet Take 0.5 mg by mouth at bedtime as needed for anxiety.     [name] apixaban (ELIQUIS) 5 MG TABS tablet  Take 1 tablet (5 mg total) by mouth 2 (two) times daily. 60 tablet 6  . atorvastatin (LIPITOR) 40 MG tablet Take 40 mg by mouth daily.    . beclomethasone (QVAR) 80 MCG/ACT inhaler Inhale 1 puff into the lungs 2 (two) times daily.    [name] CALCIUM-VITAMIN D PO Take by mouth daily.    [name] co-enzyme Q-10 30 MG capsule Take 100 mg by mouth daily.    [name] diltiazem (CARDIZEM CD) 240 MG 24 hr capsule Take 1 capsule (240 mg total) by mouth daily. 30 capsule 6  . DULoxetine (CYMBALTA) 60 MG capsule Take 60 mg by mouth daily.    Noelle Penner FIBER SUPPLEMENT PO Take 1 tablet by mouth daily.    [name] esomeprazole (NEXIUM) 40 MG capsule Take 40 mg by mouth daily at 12 noon.    . IRON, FERROUS GLUCONATE, PO Take 2 tablets by mouth daily.    [name] levothyroxine (SYNTHROID, LEVOTHROID) 25 MCG tablet Take 25 mcg by mouth daily before breakfast.    . lisinopril-hydrochlorothiazide (PRINZIDE,ZESTORETIC) 20-25 MG per tablet Take 1 tablet by mouth daily. 30 tablet 5  . loratadine (CLARITIN) 10 MG tablet Take 10 mg by mouth daily.    . Multiple Vitamins-Minerals (MULTIVITAMIN ADULTS 50+) TABS Take 1 tablet by mouth daily.    . Omega-3 Fatty Acids (FISH OIL) 1200 MG CAPS Take 1,200 mg by mouth daily.    . potassium chloride SA (K-DUR,KLOR-CON) 20 MEQ tablet Take 1 tablet (20 mEq total) by mouth daily. 30 tablet 5  . propranolol (INDERAL) 10 MG tablet Take one tablet  every 30 minutes up to 4tablets maximum as needed for paroxysmal tachycardia 30 tablet 0   No current facility-administered medications on file prior to visit.     Past Medical History  Diagnosis Date  . COPD (chronic obstructive pulmonary disease)     a. on home O2 at 2L since 2008  . HLD (hyperlipidemia)   . HTN (hypertension)   . HLD (hyperlipidemia)   . 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 Basal skull fracture 20 yrs ago  . Aortic stenosis due to bicuspid aortic valve     a. s/p bioprosthetic valve replacement 2008 at [facility]  . S/P  ascending aortic aneurysm repair 2008  . Obesity   . Thyroid disease      Past Surgical History  Procedure Laterality Date  . Abdominal aortic aneurysm repair  2008  . Aortic valve replacement  2008  . Abdominal hysterectomy    . Carpal tunnel release    . Tumor excision Left     x3 (arm)  . Cardiac catheterization      [facility]     Family History  Problem Relation Age of Onset  . Stroke Father   . Stroke Paternal Grandmother      Social History   Social History  . Marital Status: Divorced    Spouse Name: N/A  . Number of Children: N/A  . Years of Education: N/A   Occupational History  . Not on file.   Social History Main Topics  . Smoking status: Former Research scientist (life sciences)  . Smokeless tobacco: Not on file  . Alcohol Use: No  . Drug Use: No  . Sexual Activity: No   Other Topics Concern  . Not on file   Social History Narrative      PHYSICAL EXAM   BP 120/78 mmHg  Pulse 70  Ht 5' (1.524 m)  Wt 182 lb 8 oz (82.781 kg)  BMI 35.64 kg/m2 Constitutional: She is oriented to person, place, and time. She appears well-developed and well-nourished. No distress.  HENT: No nasal discharge.  Head: Normocephalic and atraumatic.  Eyes: Pupils are equal and round. No discharge.  Neck: Normal range of motion. Neck supple. No JVD present. No thyromegaly present.  Cardiovascular: Normal rate, regular rhythm, normal heart sounds. Exam reveals no gallop and no friction rub.  there is a 2 out of 6 systolic ejection murmur at the aortic area. Pulmonary/Chest: Effort normal and  diminished breath sounds . No stridor. No respiratory distress. She has no wheezes. She has no rales. She exhibits no tenderness.  Abdominal: Soft. Bowel sounds are normal. She exhibits no distension. There is no tenderness. There is no rebound and no guarding.  Musculoskeletal: Normal range of motion. She exhibits no edema and no tenderness.  Neurological: She is alert and oriented to person, place, and time.  Coordination normal.  Skin: Skin is warm and dry. No rash noted. She is not diaphoretic. No erythema. No pallor.  Psychiatric: She has a normal mood and affect. Her behavior is normal. Judgment and thought content normal.     NID:POEUM  Rhythm  WITHIN NORMAL LIMITS   ASSESSMENT AND PLAN

## 2015-04-26 NOTE — Assessment & Plan Note (Signed)
She is maintaining in sinus rhythm with diltiazem. She takes propranolol only as needed. Continue long-term anticoagulation with Eliquis.

## 2015-04-26 NOTE — Assessment & Plan Note (Signed)
The valve was functioning normally on recent echocardiogram.

## 2015-07-29 ENCOUNTER — Other Ambulatory Visit: Payer: Self-pay | Admitting: Cardiovascular Disease

## 2015-10-07 ENCOUNTER — Other Ambulatory Visit: Payer: Self-pay | Admitting: Cardiovascular Disease

## 2015-10-24 ENCOUNTER — Encounter: Payer: Self-pay | Admitting: Cardiovascular Disease

## 2015-10-24 ENCOUNTER — Ambulatory Visit (INDEPENDENT_AMBULATORY_CARE_PROVIDER_SITE_OTHER): Payer: Medicare Other | Admitting: Cardiovascular Disease

## 2015-10-24 VITALS — BP 120/60 | HR 68 | Ht 60.0 in | Wt 181.8 lb

## 2015-10-24 DIAGNOSIS — I4891 Unspecified atrial fibrillation: Secondary | ICD-10-CM | POA: Diagnosis not present

## 2015-10-24 DIAGNOSIS — Z954 Presence of other heart-valve replacement: Secondary | ICD-10-CM | POA: Diagnosis not present

## 2015-10-24 DIAGNOSIS — I480 Paroxysmal atrial fibrillation: Secondary | ICD-10-CM | POA: Diagnosis not present

## 2015-10-24 DIAGNOSIS — I10 Essential (primary) hypertension: Secondary | ICD-10-CM

## 2015-10-24 DIAGNOSIS — Z953 Presence of xenogenic heart valve: Secondary | ICD-10-CM

## 2015-10-24 NOTE — Patient Instructions (Signed)
Medication Instructions:  Your physician recommends that you continue on your current medications as directed. Please refer to the Current Medication list given to you today.   Labwork: none  Testing/Procedures: none  Follow-Up: Your physician wants you to follow-up in: six months with Dr. Fletcher Anon.  You will receive a reminder letter in the mail two months in advance. If you don't receive a letter, please call our office to schedule the follow-up appointment.   Any Other Special Instructions Will Be Listed Below (If Applicable).     If you need a refill on your cardiac medications before your next appointment, please call your pharmacy.

## 2015-10-24 NOTE — Progress Notes (Signed)
Primary care physician: Dr. Clayborn Bigness  HPI   this is a 70 year old female who is here today for a follow-up visit regarding paroxysmal atrial fibrillation and valvular heart disease.  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  Cardiac catheterization before valve replacement showed no obstructive coronary artery disease.  She was hospitalized in May, 2016 with atrial fibrillation with rapid ventricular response in the setting of upper respiratory tract infection.  She converted to normal sinus rhythm with diltiazem.   Echocardiogram showed normal LV systolic function with normal functioning of bioprosthetic aortic valve with mildly increased gradient. She was started on anticoagulation with Eliquis. She has been taking diltiazem extended release 240 mg once daily since then. She was given propranolol for breakthrough tachycardia.  she reports episodes of atrial fibrillation in the last 6 months most recently was about one month ago which lasted about 30 minutes. She took propranolol with good response. Typically these episodes happen in the setting of stress and anxiety. She has been having significant issues with her daughter. Otherwise she is doing reasonably well. Dyspnea is stable and she continues to be on home oxygen.     Allergies  Allergen Reactions  . Levaquin [Levofloxacin In D5w] Other (See Comments)    Reaction:  Fatigue and muscle soreness  . Sulfa Antibiotics Other (See Comments)    Reaction:  Unknown  . Benadryl [Diphenhydramine Hcl (Sleep)] Palpitations  . Lasix [Furosemide] Rash  . Meloxicam Rash     Current Outpatient Prescriptions on File Prior to Visit  Medication Sig Dispense Refill  . albuterol-ipratropium (COMBIVENT) 18-103 MCG/ACT inhaler  Inhale 1 puff into the lungs 3 (three) times daily as needed for wheezing or shortness of breath.    . ALPRAZolam (XANAX) 0.25 MG tablet Take 0.5 mg by mouth at bedtime as needed for anxiety.     [name] apixaban (ELIQUIS) 5 MG TABS tablet Take 1 tablet (5 mg total) by mouth 2 (two) times daily. 60 tablet 6  . atorvastatin (LIPITOR) 40 MG tablet Take 40 mg by mouth daily.    . beclomethasone (QVAR) 80 MCG/ACT inhaler Inhale 1 puff into the lungs 2 (two) times daily.    [name] CALCIUM-VITAMIN D PO Take by mouth daily.    [name] co-enzyme Q-10 30 MG capsule Take 100 mg by mouth daily.    [name] diltiazem (CARDIZEM CD) 240 MG 24 hr capsule TAKE ONE CAPSULE BY MOUTH EVERY DAY 30 capsule 3  . DULoxetine (CYMBALTA) 60 MG capsule Take 60 mg by mouth daily.    Noelle Penner FIBER SUPPLEMENT PO Take 1 tablet by mouth daily.    [name] esomeprazole (NEXIUM) 40 MG capsule Take 40 mg by mouth daily at 12 noon.    . IRON, FERROUS GLUCONATE, PO Take 2 tablets by mouth daily.    [name] levothyroxine (SYNTHROID, LEVOTHROID) 25 MCG tablet Take 25 mcg by mouth daily before breakfast.    . lisinopril-hydrochlorothiazide (PRINZIDE,ZESTORETIC) 20-25 MG tablet TAKE ONE TABLET BY MOUTH EVERY DAY 30 tablet 8  . loratadine (CLARITIN) 10 MG tablet Take 10 mg by mouth daily.    . Multiple Vitamins-Minerals (MULTIVITAMIN ADULTS 50+) TABS Take 1 tablet by mouth daily.    . Omega-3 Fatty Acids (FISH OIL) 1200 MG CAPS Take 1,200 mg by mouth daily.    [name]  potassium chloride SA (K-DUR,KLOR-CON) 20 MEQ tablet Take 1 tablet (20 mEq total) by mouth daily. 30 tablet 5  . propranolol (INDERAL) 10 MG tablet Take one tablet every 30 minutes up to 4tablets maximum as needed for paroxysmal tachycardia 30 tablet 0   No current facility-administered medications on file prior to visit.     Past Medical History  Diagnosis Date  . COPD (chronic obstructive pulmonary disease) ([place])     a. on home O2 at 2L since 2008  . HLD (hyperlipidemia)   . HTN (hypertension)   . HLD  (hyperlipidemia)   . 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 Basal skull fracture ([name]) 20 yrs ago  . Aortic stenosis due to bicuspid aortic valve     a. s/p bioprosthetic valve replacement 2008 at [facility]  . S/P ascending aortic aneurysm repair 2008  . Obesity   . Thyroid disease      Past Surgical History  Procedure Laterality Date  . Abdominal aortic aneurysm repair  2008  . Aortic valve replacement  2008  . Abdominal hysterectomy    . Carpal tunnel release    . Tumor excision Left     x3 (arm)  . Cardiac catheterization      [facility]     Family History  Problem Relation Age of Onset  . Stroke Father   . Stroke Paternal Grandmother      Social History   Social History  . Marital Status: Divorced    Spouse Name: N/A  . Number of Children: N/A  . Years of Education: N/A   Occupational History  . Not on file.   Social History Main Topics  . Smoking status: Former Research scientist (life sciences)  . Smokeless tobacco: Not on file  . Alcohol Use: No  . Drug Use: No  . Sexual Activity: No   Other Topics Concern  . Not on file   Social History Narrative      PHYSICAL EXAM   BP 120/60 mmHg  Pulse 68  Ht 5' (1.524 m)  Wt 181 lb 12 oz (82.441 kg)  BMI 35.50 kg/m2 Constitutional: She is oriented to person, place, and time. She appears well-developed and well-nourished. No distress.  HENT: No nasal discharge.  Head: Normocephalic and atraumatic.  Eyes: Pupils are equal and round. No discharge.  Neck: Normal range of motion. Neck supple. No JVD present. No thyromegaly present.  Cardiovascular: Normal rate, regular rhythm, normal heart sounds. Exam reveals no gallop and no friction rub.  there is a 2 out of 6 systolic ejection murmur at the aortic area. Pulmonary/Chest: Effort normal and  diminished breath sounds . No stridor. No respiratory distress. She has no wheezes. She has no rales. She exhibits no tenderness.  Abdominal: Soft. Bowel sounds are normal. She  exhibits no distension. There is no tenderness. There is no rebound and no guarding.  Musculoskeletal: Normal range of motion. She exhibits no edema and no tenderness.  Neurological: She is alert and oriented to person, place, and time. Coordination normal.  Skin: Skin is warm and dry. No rash noted. She is not diaphoretic. No erythema. No pallor.  Psychiatric: She has a normal mood and affect. Her behavior is normal. Judgment and thought content normal.     [id number]  Rhythm  WITHIN NORMAL LIMITS   ASSESSMENT AND PLAN

## 2015-10-25 NOTE — Assessment & Plan Note (Signed)
The valve was functioning normally on recent echocardiogram in May 2016.

## 2015-10-25 NOTE — Assessment & Plan Note (Signed)
Atrial fibrillation episodes seem to be reasonably controlled with long-acting diltiazem as well as propranolol for breakthrough episodes. She is tolerating anticoagulation with no side effects. I made no changes today.

## 2015-10-25 NOTE — Assessment & Plan Note (Signed)
Blood pressure is controlled on current medications. 

## 2015-10-26 ENCOUNTER — Emergency Department: Payer: Medicare Other

## 2015-10-26 ENCOUNTER — Emergency Department
Admission: EM | Admit: 2015-10-26 | Discharge: 2015-10-26 | Disposition: A | Discharge status: Home / Self Care | Payer: Medicare Other | Attending: Emergency Medicine | Admitting: Emergency Medicine

## 2015-10-26 DIAGNOSIS — Z7951 Long term (current) use of inhaled steroids: Secondary | ICD-10-CM | POA: Insufficient documentation

## 2015-10-26 DIAGNOSIS — Z79899 Other long term (current) drug therapy: Secondary | ICD-10-CM | POA: Diagnosis not present

## 2015-10-26 DIAGNOSIS — R42 Dizziness and giddiness: Secondary | ICD-10-CM | POA: Diagnosis not present

## 2015-10-26 DIAGNOSIS — I10 Essential (primary) hypertension: Secondary | ICD-10-CM | POA: Diagnosis not present

## 2015-10-26 DIAGNOSIS — R531 Weakness: Secondary | ICD-10-CM | POA: Diagnosis present

## 2015-10-26 DIAGNOSIS — R55 Syncope and collapse: Secondary | ICD-10-CM | POA: Diagnosis not present

## 2015-10-26 DIAGNOSIS — M6281 Muscle weakness (generalized): Secondary | ICD-10-CM | POA: Diagnosis not present

## 2015-10-26 DIAGNOSIS — Z87891 Personal history of nicotine dependence: Secondary | ICD-10-CM | POA: Diagnosis not present

## 2015-10-26 DIAGNOSIS — I4891 Unspecified atrial fibrillation: Secondary | ICD-10-CM | POA: Diagnosis not present

## 2015-10-26 LAB — CBC WITH DIFFERENTIAL/PLATELET
Basophils Absolute: 0.1 10*3/uL (ref 0–0.1)
Basophils Relative: 1 %
Eosinophils Absolute: 0.1 10*3/uL (ref 0–0.7)
Eosinophils Relative: 2 %
HCT: 37.6 % (ref 35.0–47.0)
Hemoglobin: 12.5 g/dL (ref 12.0–16.0)
Lymphocytes Relative: 26 %
Lymphs Abs: 1.5 10*3/uL (ref 1.0–3.6)
MCH: 28 pg (ref 26.0–34.0)
MCHC: 33.2 g/dL (ref 32.0–36.0)
MCV: 84.3 fL (ref 80.0–100.0)
Monocytes Absolute: 0.8 10*3/uL (ref 0.2–0.9)
Monocytes Relative: 13 %
Neutro Abs: 3.4 10*3/uL (ref 1.4–6.5)
Neutrophils Relative %: 58 %
Platelets: 298 10*3/uL (ref 150–440)
RBC: 4.46 MIL/uL (ref 3.80–5.20)
RDW: 16.5 % — ABNORMAL HIGH (ref 11.5–14.5)
WBC: 5.9 10*3/uL (ref 3.6–11.0)

## 2015-10-26 LAB — COMPREHENSIVE METABOLIC PANEL
ALT: 19 U/L (ref 14–54)
AST: 23 U/L (ref 15–41)
Albumin: 3.9 g/dL (ref 3.5–5.0)
Alkaline Phosphatase: 70 U/L (ref 38–126)
Anion gap: 7 (ref 5–15)
BUN: 11 mg/dL (ref 6–20)
CO2: 28 mmol/L (ref 22–32)
Calcium: 9 mg/dL (ref 8.9–10.3)
Chloride: 96 mmol/L — ABNORMAL LOW (ref 101–111)
Creatinine, Ser: 0.54 mg/dL (ref 0.44–1.00)
GFR calc Af Amer: >60 mL/min (ref >60)
GFR calc non Af Amer: >60 mL/min (ref >60)
Glucose, Bld: 110 mg/dL — ABNORMAL HIGH (ref 65–99)
Potassium: 3.3 mmol/L — ABNORMAL LOW (ref 3.5–5.1)
Sodium: 131 mmol/L — ABNORMAL LOW (ref 135–145)
Total Bilirubin: 0.4 mg/dL (ref 0.3–1.2)
Total Protein: 7.2 g/dL (ref 6.5–8.1)

## 2015-10-26 LAB — BRAIN NATRIURETIC PEPTIDE: B Natriuretic Peptide: 35 pg/mL (ref 0.0–100.0)

## 2015-10-26 LAB — TROPONIN I
Troponin I: 0.04 ng/mL — ABNORMAL HIGH (ref <0.031)
Troponin I: <0.03 ng/mL (ref <0.031)

## 2015-10-26 NOTE — ED Provider Notes (Signed)
[facility] Emergency Department Provider Note  ____________________________________________  Time seen: Approximately 12:44 PM  I have reviewed the triage vital signs and the nursing notes.   HISTORY  Chief Complaint Atrial Fibrillation and Weakness    HPI [name] is a 70 y.o. female patient reports she's had a history of intermittent episodes of tachycardia. She's been told by Dr. Fletcher Anon to take propranolol 10 mg half an hour 4 until the symptoms go away. Patient reports she woke up with her heart pounding heart rate 145 6 she started taking propranolol and 10 minutes after the third dose her heart rate went from about 140 01/05/1928 down to 50. She was sitting in her chair she got up and took about 4 steps and passed out. She reports she was very dizzy and lightheaded prior to passing out.   Past Medical History  Diagnosis Date  . COPD (chronic obstructive pulmonary disease) ([place])     a. on home O2 at 2L since 2008  . HLD (hyperlipidemia)   . HTN (hypertension)   . HLD (hyperlipidemia)   . 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 Basal skull fracture ([name]) 20 yrs ago  . Aortic stenosis due to bicuspid aortic valve     a. s/p bioprosthetic valve replacement 2008 at [facility]  . S/P ascending aortic aneurysm repair 2008  . Obesity   . Thyroid disease     Patient Active Problem List   Diagnosis Date Noted  . Essential hypertension 01/24/2015  . Paroxysmal atrial fibrillation ([place]) 01/24/2015  . History of aortic valve replacement with bioprosthetic valve 01/24/2015  . Atrial fibrillation with RVR ([place]) 01/11/2015    Past Surgical History  Procedure Laterality Date  . Abdominal aortic aneurysm repair  2008  . Aortic valve replacement  2008  . Abdominal hysterectomy    . Carpal tunnel release    . Tumor excision Left     x3 (arm)  . Cardiac catheterization      [facility]    Current Outpatient Rx  Name  Route  Sig  Dispense   Refill  . albuterol-ipratropium (COMBIVENT) 18-103 MCG/ACT inhaler   Inhalation   Inhale 1 puff into the lungs 3 (three) times daily as needed for wheezing or shortness of breath.         . ALPRAZolam (XANAX) 0.25 MG tablet   Oral   Take 0.5 mg by mouth at bedtime as needed for anxiety.          [name] apixaban (ELIQUIS) 5 MG TABS tablet   Oral   Take 1 tablet (5 mg total) by mouth 2 (two) times daily.   60 tablet   6   . atorvastatin (LIPITOR) 40 MG tablet   Oral   Take 40 mg by mouth daily.         . beclomethasone (QVAR) 80 MCG/ACT inhaler   Inhalation   Inhale 1 puff into the lungs 2 (two) times daily.         [name] CALCIUM-VITAMIN D PO   Oral   Take by mouth daily.         [name] co-enzyme Q-10 30 MG capsule   Oral   Take 100 mg by mouth daily.         [name] diltiazem (CARDIZEM CD) 240 MG 24 hr capsule      TAKE ONE CAPSULE BY MOUTH EVERY DAY   30 capsule  3   . DULoxetine (CYMBALTA) 60 MG capsule   Oral   Take 60 mg by mouth daily.         Noelle Penner FIBER SUPPLEMENT PO   Oral   Take 1 tablet by mouth daily.         [name] esomeprazole (NEXIUM) 40 MG capsule   Oral   Take 40 mg by mouth daily at 12 noon.         . IRON, FERROUS GLUCONATE, PO   Oral   Take 2 tablets by mouth daily.         [name] levothyroxine (SYNTHROID, LEVOTHROID) 25 MCG tablet   Oral   Take 25 mcg by mouth daily before breakfast.         . lisinopril-hydrochlorothiazide (PRINZIDE,ZESTORETIC) 20-25 MG tablet      TAKE ONE TABLET BY MOUTH EVERY DAY   30 tablet   8   . loratadine (CLARITIN) 10 MG tablet   Oral   Take 10 mg by mouth daily.         . Multiple Vitamins-Minerals (MULTIVITAMIN ADULTS 50+) TABS   Oral   Take 1 tablet by mouth daily.         . Omega-3 Fatty Acids (FISH OIL) 1200 MG CAPS   Oral   Take 1,200 mg by mouth daily.         . potassium chloride SA (K-DUR,KLOR-CON) 20 MEQ tablet   Oral   Take 1 tablet (20 mEq total) by mouth daily.   30 tablet   5    . propranolol (INDERAL) 10 MG tablet      Take one tablet every 30 minutes up to 4tablets maximum as needed for paroxysmal tachycardia   30 tablet   0     Allergies Levaquin; Sulfa antibiotics; Benadryl; Lasix; and Meloxicam  Family History  Problem Relation Age of Onset  . Stroke Father   . Stroke Paternal Grandmother     Social History Social History  Substance Use Topics  . Smoking status: Former Research scientist (life sciences)  . Smokeless tobacco: None  . Alcohol Use: No    Review of Systems Constitutional: No fever/chills Eyes: No visual changes. ENT: No sore throat. Cardiovascular: Denies chest pain. Respiratory: Denies shortness of breath. Gastrointestinal: No abdominal pain.  No nausea, no vomiting.  No diarrhea.  No constipation. Genitourinary: Negative for dysuria. Musculoskeletal: Negative for back pain. Skin: Negative for rash. Neurological: Negative for headaches, focal weakness or numbness.  10-point ROS otherwise negative.  ____________________________________________   PHYSICAL EXAM:  VITAL SIGNS: ED Triage Vitals  Enc Vitals Group     BP 10/26/15 1135 136/66 mmHg     Pulse Rate 10/26/15 1230 59     Resp 10/26/15 1135 19     Temp --      Temp src --      SpO2 10/26/15 1135 99 %     Weight 10/26/15 1135 181 lb (82.101 kg)     Height 10/26/15 1135 5' (1.524 m)     Head Cir --      Peak Flow --      Pain Score --      Pain Loc --      Pain Edu? --      Excl. in [place]? --     Constitutional: Alert and oriented. Well appearing and in no acute distress. Eyes: Conjunctivae are normal. PERRL. EOMI. Head: Atraumatic. Nose: No congestion/rhinnorhea. Mouth/Throat: Mucous membranes are moist.  Oropharynx non-erythematous. Neck: No  stridor.  Cardiovascular: Normal rate, regular rhythm. Grossly normal heart sounds.  Good peripheral circulation. Respiratory: Normal respiratory effort.  No retractions. Lungs CTAB. Gastrointestinal: Soft and nontender. No distention. No  abdominal bruits. No CVA tenderness. Musculoskeletal: No lower extremity tenderness nor edema.  No joint effusions. Neurologic:  Normal speech and language. No gross focal neurologic deficits are appreciated. No gait instability. Skin:  Skin is warm, dry and intact. No rash noted. Psychiatric: Mood and affect are normal. Speech and behavior are normal.  ____________________________________________   LABS (all labs ordered are listed, but only abnormal results are displayed)  Labs Reviewed  COMPREHENSIVE METABOLIC PANEL - Abnormal; Notable for the following:    Sodium 131 (*)    Potassium 3.3 (*)    Chloride 96 (*)    Glucose, Bld 110 (*)    All other components within normal limits  TROPONIN I - Abnormal; Notable for the following:    Troponin I 0.04 (*)    All other components within normal limits  CBC WITH DIFFERENTIAL/PLATELET - Abnormal; Notable for the following:    RDW 16.5 (*)    All other components within normal limits  BRAIN NATRIURETIC PEPTIDE  TROPONIN I   ____________________________________________  EKG  EKG read and interpreted by me shows sinus bradycardia rate of 57 left axis no acute ST-T wave changes computer is reading minimal ST se ____________________________________________  RADIOLOGYChest x-ray read by radiology as mild enlargement of cardiac so compared to prior____________________________________________   PROCEDURES    ____________________________________________   INITIAL IMPRESSION / ASSESSMENT AND PLAN / ED COURSE  Pertinent labs & imaging results that were available during my care of the patient were reviewed by me and considered in my medical decision making (see chart for details).   ____________________________________________   FINAL CLINICAL IMPRESSION(S) / ED DIAGNOSES  Final diagnoses:  Syncope, unspecified syncope type      Nena Polio, MD 10/26/15 1511

## 2015-10-26 NOTE — Discharge Instructions (Signed)
Syncope °Syncope is a medical term for fainting or passing out. This means you lose consciousness and drop to the ground. People are generally unconscious for less than 5 minutes. You may have some muscle twitches for up to 15 seconds before waking up and returning to normal. Syncope occurs more often in older adults, but it can happen to anyone. While most causes of syncope are not dangerous, syncope can be a sign of a serious medical problem. It is important to seek medical care.  °CAUSES  °Syncope is caused by a sudden drop in blood flow to the brain. The specific cause is often not determined. Factors that can bring on syncope include: °· Taking medicines that lower blood pressure. °· Sudden changes in posture, such as standing up quickly. °· Taking more medicine than prescribed. °· Standing in one place for too long. °· Seizure disorders. °· Dehydration and excessive exposure to heat. °· Low blood sugar (hypoglycemia). °· Straining to have a bowel movement. °· Heart disease, irregular heartbeat, or other circulatory problems. °· Fear, emotional distress, seeing blood, or severe pain. °SYMPTOMS  °Right before fainting, you may: °· Feel dizzy or light-headed. °· Feel nauseous. °· See all white or all black in your field of vision. °· Have cold, clammy skin. °DIAGNOSIS  °Your health care provider will ask about your symptoms, perform a physical exam, and perform an electrocardiogram (ECG) to record the electrical activity of your heart. Your health care provider may also perform other heart or blood tests to determine the cause of your syncope which may include: °· Transthoracic echocardiogram (TTE). During echocardiography, sound waves are used to evaluate how blood flows through your heart. °· Transesophageal echocardiogram (TEE). °· Cardiac monitoring. This allows your health care provider to monitor your heart rate and rhythm in real time. °· Holter monitor. This is a portable device that records your  heartbeat and can help diagnose heart arrhythmias. It allows your health care provider to track your heart activity for several days, if needed. °· Stress tests by exercise or by giving medicine that makes the heart beat faster. °TREATMENT  °In most cases, no treatment is needed. Depending on the cause of your syncope, your health care provider may recommend changing or stopping some of your medicines. °HOME CARE INSTRUCTIONS °· Have someone stay with you until you feel stable. °· Do not drive, use machinery, or play sports until your health care provider says it is okay. °· Keep all follow-up appointments as directed by your health care provider. °· Lie down right away if you start feeling like you might faint. Breathe deeply and steadily. Wait until all the symptoms have passed. °· Drink enough fluids to keep your urine clear or pale yellow. °· If you are taking blood pressure or heart medicine, get up slowly and take several minutes to sit and then stand. This can reduce dizziness. °SEEK IMMEDIATE MEDICAL CARE IF:  °· You have a severe headache. °· You have unusual pain in the chest, abdomen, or back. °· You are bleeding from your mouth or rectum, or you have black or tarry stool. °· You have an irregular or very fast heartbeat. °· You have pain with breathing. °· You have repeated fainting or seizure-like jerking during an episode. °· You faint when sitting or lying down. °· You have confusion. °· You have trouble walking. °· You have severe weakness. °· You have vision problems. °If you fainted, call your local emergency services (911 in [place] Do not drive   yourself to the hospital.    This information is not intended to replace advice given to you by your health care provider. Make sure you discuss any questions you have with your health care provider.   Document Released: 08/24/2005 Document Revised: 01/08/2015 Document Reviewed: 10/23/2011 Elsevier Interactive Patient Education [employer].   I discussed her case with the cardiologist. Dr. Harrington Challenger is on-call for Dr. Gillermo Murdoch IDA. We think that you should take at least 40 minutes between pills if you have to take propranolol for your tachycardia again. And then be careful not to get up right away and try to walk because probably what happened this time was that sure heart rate went down and he just passed out because her brain wasn't getting enough blood because of the low heart rate. Please call the cardiology office or they will call you to arrange follow-up later on this week. Please return for any further problems.

## 2015-10-26 NOTE — ED Notes (Signed)
Troponin 0.04. MD notified.

## 2015-10-26 NOTE — ED Notes (Addendum)
Pt came to ED via EMS c/o afib attack. Pt reports having an afib attack this morning. Pt took her medication every 30 mins x3 times. Pt then reported heart rate 56-60. Pt reports feeling dizzy and collapsing earlier this morning after taking her 3rd pill. Pt reports feeling weak. Pt has history of afib, heart surgery, copd.

## 2015-10-28 ENCOUNTER — Telehealth: Payer: Self-pay

## 2015-10-28 ENCOUNTER — Encounter: Payer: Self-pay | Admitting: Physician Assistant

## 2015-10-28 NOTE — Telephone Encounter (Signed)
l mom to schedule ED f/u

## 2015-10-29 DIAGNOSIS — F411 Generalized anxiety disorder: Secondary | ICD-10-CM | POA: Diagnosis not present

## 2015-10-29 DIAGNOSIS — J449 Chronic obstructive pulmonary disease, unspecified: Secondary | ICD-10-CM | POA: Diagnosis not present

## 2015-10-29 DIAGNOSIS — F331 Major depressive disorder, recurrent, moderate: Secondary | ICD-10-CM | POA: Diagnosis not present

## 2015-10-29 DIAGNOSIS — Z9981 Dependence on supplemental oxygen: Secondary | ICD-10-CM | POA: Diagnosis not present

## 2015-10-29 DIAGNOSIS — I4891 Unspecified atrial fibrillation: Secondary | ICD-10-CM | POA: Diagnosis not present

## 2015-10-29 DIAGNOSIS — R5383 Other fatigue: Secondary | ICD-10-CM | POA: Diagnosis not present

## 2015-10-30 ENCOUNTER — Ambulatory Visit (INDEPENDENT_AMBULATORY_CARE_PROVIDER_SITE_OTHER): Payer: Medicare Other | Admitting: Physician Assistant

## 2015-10-30 ENCOUNTER — Encounter: Payer: Self-pay | Admitting: Physician Assistant

## 2015-10-30 ENCOUNTER — Ambulatory Visit: Payer: Medicare Other | Admitting: Physician Assistant

## 2015-10-30 VITALS — BP 110/71 | HR 69 | Ht 60.0 in | Wt 182.5 lb

## 2015-10-30 DIAGNOSIS — R55 Syncope and collapse: Secondary | ICD-10-CM

## 2015-10-30 DIAGNOSIS — I480 Paroxysmal atrial fibrillation: Secondary | ICD-10-CM | POA: Diagnosis not present

## 2015-10-30 DIAGNOSIS — I10 Essential (primary) hypertension: Secondary | ICD-10-CM

## 2015-10-30 DIAGNOSIS — R0602 Shortness of breath: Secondary | ICD-10-CM | POA: Diagnosis not present

## 2015-10-30 DIAGNOSIS — I951 Orthostatic hypotension: Secondary | ICD-10-CM

## 2015-10-30 DIAGNOSIS — E669 Obesity, unspecified: Secondary | ICD-10-CM

## 2015-10-30 NOTE — Patient Instructions (Addendum)
Medication Instructions:  Your physician recommends that you continue on your current medications as directed. Please refer to the Current Medication list given to you today.   Labwork: none  Testing/Procedures: Your physician has requested that you have a lexiscan myoview. For further information please visit [email or web address]. Please follow instruction sheet, as given.  [place]  Your caregiver has ordered a Stress Test with nuclear imaging. The purpose of this test is to evaluate the blood supply to your heart muscle. This procedure is referred to as a "Non-Invasive Stress Test." This is because other than having an IV started in your vein, nothing is inserted or "invades" your body. Cardiac stress tests are done to find areas of poor blood flow to the heart by determining the extent of coronary artery disease (CAD). Some patients exercise on a treadmill, which naturally increases the blood flow to your heart, while others who are  unable to walk on a treadmill due to physical limitations have a pharmacologic/chemical stress agent called Lexiscan . This medicine will mimic walking on a treadmill by temporarily increasing your coronary blood flow.   Please note: these test may take anywhere between 2-4 hours to complete  PLEASE REPORT TO [facility] AT THE FIRST DESK WILL DIRECT YOU WHERE TO GO  Date of Procedure: Feb 27  Arrival Time for Procedure: 7:15am  Instructions regarding medication:    ___xx_:  Hold propranolol the night before procedure and morning of procedure    PLEASE NOTIFY THE OFFICE AT LEAST 24 HOURS IN ADVANCE IF YOU ARE UNABLE TO KEEP YOUR APPOINTMENT.  [phone] AND  PLEASE NOTIFY NUCLEAR MEDICINE AT [facility] AT LEAST 24 HOURS IN ADVANCE IF YOU ARE UNABLE TO KEEP YOUR APPOINTMENT. [phone]  How to prepare for your Myoview test:   Do not eat or drink after midnight  No caffeine for 24 hours prior to test  No smoking  24 hours prior to test.  Your medication may be taken with water.  If your doctor stopped a medication because of this test, do not take that medication.  Ladies, please do not wear dresses.  Skirts or pants are appropriate. Please wear a short sleeve shirt.  No perfume, cologne or lotion.  Wear comfortable walking shoes. No heels!            Follow-Up: Your physician recommends that you schedule a follow-up appointment in 2 weeks with Ignacia Bayley, NP or Dr. Fletcher Anon.    Any Other Special Instructions Will Be Listed Below (If Applicable).     If you need a refill on your cardiac medications before your next appointment, please call your pharmacy. 'Cardiac Nuclear Scanning A cardiac nuclear scan is used to check your heart for problems, such as the following:  A portion of the heart is not getting enough blood.  Part of the heart muscle has died, which happens with a heart attack.  The heart wall is not working normally.  In this test, a radioactive dye (tracer) is injected into your bloodstream. After the tracer has traveled to your heart, a scanning device is used to measure how much of the tracer is absorbed by or distributed to various areas of your heart. LET [facility] CARE PROVIDER KNOW ABOUT:  Any allergies you have.  All medicines you are taking, including vitamins, herbs, eye drops, creams, and over-the-counter medicines.  Previous problems you or members of your family have had with the use of anesthetics.  Any blood disorders  you have.  Previous surgeries you have had.  Medical conditions you have.  RISKS AND COMPLICATIONS Generally, this is a safe procedure. However, as with any procedure, problems can occur. Possible problems include:   Serious chest pain.  Rapid heartbeat.  Sensation of warmth in your chest. This usually passes quickly. BEFORE THE PROCEDURE Ask your health care provider about changing or stopping your regular  medicines. PROCEDURE This procedure is usually done at a hospital and takes 2-4 hours.  An IV tube is inserted into one of your veins.  Your health care provider will inject a small amount of radioactive tracer through the tube.  You will then wait for 20-40 minutes while the tracer travels through your bloodstream.  You will lie down on an exam table so images of your heart can be taken. Images will be taken for about 15-20 minutes.  You will exercise on a treadmill or stationary bike. While you exercise, your heart activity will be monitored with an electrocardiogram (ECG), and your blood pressure will be checked.  If you are unable to exercise, you may be given a medicine to make your heart beat faster.  When blood flow to your heart has peaked, tracer will again be injected through the IV tube.  After 20-40 minutes, you will get back on the exam table and have more images taken of your heart.  When the procedure is over, your IV tube will be removed. AFTER THE PROCEDURE  You will likely be able to leave shortly after the test. Unless your health care provider tells you otherwise, you may return to your normal schedule, including diet, activities, and medicines.  Make sure you find out how and when you will get your test results.   This information is not intended to replace advice given to you by your health care provider. Make sure you discuss any questions you have with your health care provider.   Document Released: 09/18/2004 Document Revised: 08/29/2013 Document Reviewed: 08/02/2013 Elsevier Interactive Patient Education [employer].

## 2015-10-30 NOTE — Progress Notes (Signed)
Cardiology Office Note Date:  10/30/2015  Patient ID:  [name], [name] Jan 18, 1946, MRN [id number] PCP:  [name], MD  Cardiologist:  Dr. Fletcher Anon, MD    Chief Complaint: ED follow up for syncope  History of Present Illness: [name] is a 70 y.o. 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 Cardiac catheterization before valve replacement showed no obstructive coronary artery disease.She was hospitalized in May, 2016 with atrial fibrillation with rapid ventricular response in the setting of upper respiratory tract infection.She converted to normal sinus rhythm with diltiazem. Echocardiogram showed normal LV systolic function with normal functioning of bioprosthetic aortic valve with mildly increased gradient. She was started on anticoagulation with Eliquis. She has been taking diltiazem extended release 240 mg once daily since then. She was given propranolol for breakthrough tachycardia. At he most recent follow up on 10/24/15 she reported episodes of atrial fibrillation in the last 6 months most recently was about one month ago which lasted about 30 minutes. She took propranolol with good response. Typically these episodes happen in the setting of stress and anxiety. She had been having significant issues with her daughter. Otherwise she had been doing reasonably well. Dyspnea was stable and she continued to be on home oxygen. She was seen in the ED on 2/18 with Afib with RVR with heart rate in the 140's upon waking up. She took her usual prn propranolol, but after the thrid dose her heart rate was still in the 140's. She subsequently  got up from a seated position and suffered a syncopal episode. EKG showed sinus bradycardia with a heart rate of 57 bpm, no acute st/t changes. Troponin 0.04-->0.03. K+ 3.3, Na 131. BNP 35. She was diagnosed with syncope and discharged from the ED. It is unclear what was done for her based on ED note documentation.   She reports waking up on 2/18 with tachy-palpitations and noted a HR in the 140's. She took her prn propranolol x 3 separated by 35 minutes each time. She stood up from a sitting position shortly after taking the third dose. This led to her getting dizzy and passing out. She denied any chest pain, increased SOB, diaphoresis, nausea, or vomiting. She is uncertain if she was still in Afib s/p passing out. She was brought to the ED as above. Since then she has not had any further syncopal episodes. She does report this was her 4th episode of Afib with RVR since May 2016. She is quite symptomatic with her Afib. She has not missed any doses of her Eliquis. Her PCP increased her KCl given her hypokalemia noted in the ED on 2/18.    Past Medical History  Diagnosis Date  . COPD (chronic obstructive pulmonary disease) ([place])     a. on home O2 at 2L since 2008  . HLD (hyperlipidemia)   . HTN (hypertension)   . HLD (hyperlipidemia)   . 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 Basal skull fracture ([name]) 20 yrs ago  . Aortic stenosis due to bicuspid aortic valve     a. s/p bioprosthetic valve  replacement 2008 at [facility]  . S/P ascending aortic aneurysm repair 2008  . Obesity   . Thyroid disease   . PAF (paroxysmal atrial fibrillation) ([name])     a. on Eliquis; b. CHADS2VASc = 3 (HTN, age x 59, female)  . Chronic respiratory failure [facility])     Past Surgical History  Procedure Laterality Date  . Abdominal aortic aneurysm repair  2008  . Aortic valve replacement  2008  . Abdominal hysterectomy    . Carpal tunnel release    . Tumor excision Left     x3 (arm)  . Cardiac  catheterization      [facility]    Current Outpatient Prescriptions  Medication Sig Dispense Refill  . albuterol-ipratropium (COMBIVENT) 18-103 MCG/ACT inhaler Inhale 1 puff into the lungs 3 (three) times daily as needed for wheezing or shortness of breath.    . ALPRAZolam (XANAX) 0.25 MG tablet Take 0.5 mg by mouth at bedtime as needed for anxiety.     [name] apixaban (ELIQUIS) 5 MG TABS tablet Take 1 tablet (5 mg total) by mouth 2 (two) times daily. 60 tablet 6  . atorvastatin (LIPITOR) 40 MG tablet Take 40 mg by mouth daily.    . beclomethasone (QVAR) 80 MCG/ACT inhaler Inhale 1 puff into the lungs 2 (two) times daily.    [name] CALCIUM-VITAMIN D PO Take by mouth daily.    [name] diltiazem (CARDIZEM CD) 240 MG 24 hr capsule TAKE ONE CAPSULE BY MOUTH EVERY DAY 30 capsule 3  . DULoxetine (CYMBALTA) 60 MG capsule Take 60 mg by mouth daily.    Noelle Penner FIBER SUPPLEMENT PO Take 1 tablet by mouth daily.    . IRON, FERROUS GLUCONATE, PO Take 2 tablets by mouth daily.    [name] levothyroxine (SYNTHROID, LEVOTHROID) 25 MCG tablet Take 25 mcg by mouth daily before breakfast.    . lisinopril (PRINIVIL,ZESTRIL) 20 MG tablet Take 20 mg by mouth daily.    [name] lisinopril-hydrochlorothiazide (PRINZIDE,ZESTORETIC) 20-25 MG tablet TAKE ONE TABLET BY MOUTH EVERY DAY 30 tablet 8  . loratadine (CLARITIN) 10 MG tablet Take 10 mg by mouth daily.    . Multiple Vitamins-Minerals (MULTIVITAMIN ADULTS 50+) TABS Take 1 tablet by mouth daily.    . Omega-3 Fatty Acids (FISH OIL) 1200 MG CAPS Take 1,200 mg by mouth daily.    . potassium chloride (K-DUR,KLOR-CON) 10 MEQ tablet Take 10 mEq by mouth 3 (three) times daily.     . propranolol (INDERAL) 10 MG tablet Take one tablet every 30 minutes up to 4tablets maximum as needed for paroxysmal tachycardia 30 tablet 0   No current facility-administered medications for this visit.    Allergies:   Levaquin; Sulfa antibiotics; Benadryl; Lasix; and Meloxicam   Social History:  The patient  reports that  she has quit smoking. She does not have any smokeless tobacco history on file. She reports that she does not drink alcohol or use illicit drugs.   Family History:  The patient's family history includes Stroke in her father and paternal grandmother.  ROS:   Review of Systems  Constitutional: Positive for malaise/fatigue. Negative for fever, chills, weight loss and diaphoresis.  HENT: Negative for congestion.   Eyes: Negative for discharge and redness.  Respiratory: Positive for shortness of breath. Negative for cough, hemoptysis, sputum production and wheezing.   Cardiovascular: Positive for palpitations. Negative for chest pain, orthopnea, claudication, leg swelling and PND.  Gastrointestinal: Negative for nausea, vomiting and blood in stool.  Musculoskeletal: Positive for falls.  Negative for myalgias.  Skin: Negative for rash.  Neurological: Positive for loss of consciousness. Negative for dizziness, tingling, tremors, sensory change, speech change, focal weakness, seizures and weakness.  Endo/Heme/Allergies: Does not bruise/bleed easily.  Psychiatric/Behavioral: Negative for substance abuse. The patient is not nervous/anxious.   All other systems reviewed and are negative.    PHYSICAL EXAM:  VS:  Ht 5' (1.524 m)  Wt 182 lb 8 oz (82.781 kg)  BMI 35.64 kg/m2 BMI: Body mass index is 35.64 kg/(m^2). Well nourished, well developed, in no acute distress HEENT: normocephalic, atraumatic Neck: no JVD, carotid bruits or masses Cardiac:  normal S1, S2; RRR; no murmurs, rubs, or gallops Lungs:  clear to auscultation bilaterally, no wheezing, rhonchi or rales Abd: obese, soft, nontender, no hepatomegaly, + BS MS: no deformity or atrophy Ext: no edema Skin: warm and dry, no rash Neuro:  moves all extremities spontaneously, no focal abnormalities noted, follows commands Psych: euthymic mood, full affect   EKG:  Was ordered today. Shows NSR, 69 bpm, no acute st/t changes  Orthostatic Vital  Signs: Lying: 128/70, 68 bpm Sitting: 107/75, 73 bpm Standing: 110/70, 72 bpm Standing x 3 min: 118/74, 74 bpm  Recent Labs: 01/11/2015: Magnesium 1.8; TSH 6.228* 10/26/2015: ALT 19; B Natriuretic Peptide 35.0; BUN 11; Creatinine, Ser 0.54; Hemoglobin 12.5; Platelets 298; Potassium 3.3*; Sodium 131*  No results found for requested labs within last 365 days.   Estimated Creatinine Clearance: 64.2 mL/min (by C-G formula based on Cr of 0.54).   Wt Readings from Last 3 Encounters:  10/30/15 182 lb 8 oz (82.781 kg)  10/26/15 181 lb (82.101 kg)  10/24/15 181 lb 12 oz (82.441 kg)     Other studies reviewed: Additional studies/records reviewed today include: summarized above  ASSESSMENT AND PLAN:  1. Syncope/orthostatic hypotension: In the setting of increased propranolol usage 2/2 Afib with RVR on 10/26/15. She had taken her prn propranolol x 3 separated by 35 minutes each. She stood up from a sitting position, got dizzy, and suffered a syncopal episode. No preceding symptoms. No further syncopal episodes. She is noted to be orthostatic today. She will increase her fluids and separate her propranolol by a longer duration when she needs to take them, should she need to take them. Afib by itself generally does not lead to syncope. At this time I cannot rule out post termination pause leading to her syncope. The ED noted a heart rate in the 50's with a EKG showing sinus bradycardia. Consider outpatient cardiac monitoring in the future, she declines this at this time. In the long run given that she is quite symptomatic with her Afib and has suffered an episode of syncope associated with it for a number of possible reasons she would do better if she could maintain sinus rhythm. It has been many years since her last ischemic evaluation, several years before her AVR and aorta grafting. Schedule patient for Lexiscan Myoview to evaluate for ischemic heart disease. If normal she may benefit from Flecainide.  Alternative options include inpatient Tikosyn loading. Doubt she is a good candidate for amiodarone given her chronic respiratory failure and age.   2. PAF: As above. She is currently maintaining sinus rhythm with a heart rate in the 60's. Continue Cardizem CD 240 mg daily and propranolol 10 mg qid prn tachy-palpitations to a max of 4 tabs daily. Continue Eliquis 5 mg bid. Based on her weight, age, and renal function this is the appropriate dose for her at this time. She  has not missed any doses at this time. CHADS2VASc at least 3 (HTN, age, female).   3. HTN: Well controlled. Continue current medications.   4. History of AVR with bioprosthetic valve: Stable by echo 01/2015.   5. Obesity: Likely playing a role in #2.   6. Hypokalemia: PCP increased KCl. She has planned follow up bmet with them.   Disposition: F/u with Ignacia Bayley, NP or Dr. Fletcher Anon, MD s/p nuclear stress test  Current medicines are reviewed at length with the patient today.  The patient did not have any concerns regarding medicines.  Melvern Banker PA-C 10/30/2015 1:39 PM     [address] [place], [place] [address] [phone]

## 2015-11-04 ENCOUNTER — Encounter
Admission: RE | Admit: 2015-11-04 | Discharge: 2015-11-04 | Disposition: A | Discharge status: Home / Self Care | Payer: Medicare Other | Source: Ambulatory Visit | Attending: Physician Assistant | Admitting: Physician Assistant

## 2015-11-04 DIAGNOSIS — R0602 Shortness of breath: Secondary | ICD-10-CM

## 2015-11-04 MED ORDER — REGADENOSON 0.4 MG/5ML IV SOLN
0.4000 mg | Freq: Once | INTRAVENOUS | Status: AC
Start: 2015-11-04 — End: 2015-11-04
  Administered 2015-11-04: 0.4 mg via INTRAVENOUS

## 2015-11-04 MED ORDER — TECHNETIUM TC 99M SESTAMIBI - CARDIOLITE
32.2640 | Freq: Once | INTRAVENOUS | Status: AC | PRN
  Administered 2015-11-04: 32.264 via INTRAVENOUS

## 2015-11-04 MED ORDER — TECHNETIUM TC 99M SESTAMIBI - CARDIOLITE
13.6540 | Freq: Once | INTRAVENOUS | Status: AC | PRN
  Administered 2015-11-04: 13.654 via INTRAVENOUS

## 2015-11-05 LAB — NM MYOCAR MULTI W/SPECT W/WALL MOTION / EF
Estimated workload: 1 METS
Exercise duration (min): 0 min
Exercise duration (sec): 0 s
LV dias vol: 50 mL
LV sys vol: 19 mL
MPHR: 152 {beats}/min
Peak HR: 86 {beats}/min
Percent HR: 56 %
Percent of predicted max HR: 56 %
Rest HR: 61 {beats}/min
SDS: 3
SRS: 18
SSS: 20
Stage 1 HR: 67 {beats}/min
Stage 2 Grade: 0 %
Stage 2 HR: 62 {beats}/min
Stage 2 Speed: 0 mph
Stage 3 Grade: 0 %
Stage 3 HR: 62 {beats}/min
Stage 3 Speed: 0 mph
Stage 4 Grade: 0 %
Stage 4 HR: 86 {beats}/min
Stage 4 Speed: 0 mph
Stage 5 DBP: 57 mmHg
Stage 5 Grade: 0 %
Stage 5 HR: 80 {beats}/min
Stage 5 SBP: 92 mmHg
Stage 5 Speed: 0 mph
Stage 6 DBP: 60 mmHg
Stage 6 Grade: 0 %
Stage 6 HR: 73 {beats}/min
Stage 6 SBP: 108 mmHg
Stage 6 Speed: 0 mph
TID: 1.09

## 2015-11-07 ENCOUNTER — Telehealth: Payer: Self-pay | Admitting: Cardiovascular Disease

## 2015-11-07 NOTE — Telephone Encounter (Signed)
Pt would like stress test results. Please call. 

## 2015-11-08 NOTE — Telephone Encounter (Signed)
Patient has questions about results.  Please call.

## 2015-11-08 NOTE — Telephone Encounter (Signed)
See results note. 

## 2015-11-08 NOTE — Telephone Encounter (Signed)
Reviewed lexi myoview results w/pt. Confirmed March appt

## 2015-11-18 DIAGNOSIS — G4733 Obstructive sleep apnea (adult) (pediatric): Secondary | ICD-10-CM | POA: Diagnosis not present

## 2015-11-22 ENCOUNTER — Encounter: Payer: Self-pay | Admitting: Cardiovascular Disease

## 2015-11-22 ENCOUNTER — Ambulatory Visit (INDEPENDENT_AMBULATORY_CARE_PROVIDER_SITE_OTHER): Payer: Medicare Other | Admitting: Cardiovascular Disease

## 2015-11-22 VITALS — BP 124/76 | HR 69 | Ht 60.0 in | Wt 186.8 lb

## 2015-11-22 DIAGNOSIS — I10 Essential (primary) hypertension: Secondary | ICD-10-CM

## 2015-11-22 DIAGNOSIS — I480 Paroxysmal atrial fibrillation: Secondary | ICD-10-CM

## 2015-11-22 MED ORDER — DILTIAZEM HCL ER COATED BEADS 180 MG PO CP24: 180.0000 mg | ORAL_CAPSULE | Freq: Every day | ORAL | Status: DC

## 2015-11-22 MED ORDER — FLECAINIDE ACETATE 50 MG PO TABS: 50.0000 mg | ORAL_TABLET | Freq: Two times a day (BID) | ORAL | Status: DC

## 2015-11-22 NOTE — Progress Notes (Signed)
Cardiology Office Note   Date:  11/22/2015   ID:  [name], DOB Aug 21, 1946, MRN [id number]  PCP:  [name], MD  Cardiologist:   [name], MD   Chief Complaint  Patient presents with  . other    2 week follow up from [place]. Pt. c/o an A-fib attack since she had since stress test. Meds reviewed by the patient verbally.       History of Present Illness: [name] is a 70 y.o. female who presents for A follow-up visit regarding persistent atrial fibrillation and valvular heart disease. 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  Cardiac catheterization before valve replacement showed no obstructive coronary artery disease.  She was hospitalized in May, 2016 with atrial fibrillation with rapid ventricular response in the setting of upper respiratory tract infection.  She converted to normal sinus rhythm with diltiazem.   Echocardiogram showed normal LV systolic function with normal functioning of bioprosthetic aortic valve with mildly increased gradient. She was started on anticoagulation with Eliquis. She has been taking diltiazem extended release 240 mg once daily since then. She was given propranolol for breakthrough tachycardia. Episodes of atrial fibrillation became much more frequent over the last few months. She has been taking propranolol very frequently. She had a prolonged episode in February and took propranolol every 30 minutes until she converted and had a syncopal episode after that. When she is in atrial fibrillation, her heart rate is usually around 140-150 bpm. She was seen by Christell Faith in February. She underwent a pharmacologic nuclear stress test which showed no evidence of ischemia.   she continues to have frequent episodes of  palpitations and tachycardia felt to be due to breakthrough atrial fibrillation. She uses her CPAP every day.  Past Medical History  Diagnosis Date  . COPD (chronic obstructive pulmonary disease) ([place])     a. on home O2 at 2L since 2008  . HLD (hyperlipidemia)   . HTN (hypertension)   . HLD (hyperlipidemia)   . 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 Basal skull fracture ([name]) 20 yrs ago  . Aortic stenosis due to bicuspid aortic valve     a. s/p bioprosthetic valve replacement 2008 at [facility]  . S/P ascending aortic aneurysm repair 2008  . Obesity   . Thyroid disease   . PAF (paroxysmal atrial fibrillation) ([name])     a. on Eliquis; b. CHADS2VASc = 3 (HTN, age x 89, female)  . Chronic respiratory failure [facility])     Past Surgical History  Procedure Laterality Date  . Abdominal aortic aneurysm repair  2008  . Aortic valve replacement  2008  . Abdominal hysterectomy    . Carpal tunnel release    . Tumor excision Left     x3 (arm)  . Cardiac catheterization      [facility]     Current Outpatient Prescriptions  Medication Sig Dispense Refill  . albuterol-ipratropium (COMBIVENT) 18-103 MCG/ACT inhaler Inhale 1 puff into the lungs 3 (three) times daily as needed for wheezing or shortness of breath.    . ALPRAZolam (XANAX) 0.25 MG tablet Take 0.5 mg by mouth at bedtime as needed for anxiety.     [name]  apixaban (ELIQUIS) 5 MG TABS tablet Take 1 tablet (5 mg total) by mouth 2 (two) times daily. 60 tablet 6  . atorvastatin (LIPITOR) 40 MG tablet Take 40 mg by mouth daily.    . beclomethasone (QVAR) 80 MCG/ACT inhaler Inhale 1 puff into the lungs 2 (two) times daily.    [name] CALCIUM-VITAMIN D PO Take by mouth daily.    [name] diltiazem (CARDIZEM CD) 240 MG 24 hr capsule TAKE ONE CAPSULE BY MOUTH EVERY DAY 30 capsule 3  . DULoxetine (CYMBALTA) 60 MG capsule Take 60 mg by mouth daily.    Noelle Penner FIBER SUPPLEMENT PO Take 1 tablet by mouth daily.    . IRON, FERROUS GLUCONATE, PO Take 2  tablets by mouth daily.    [name] levothyroxine (SYNTHROID, LEVOTHROID) 25 MCG tablet Take 25 mcg by mouth daily before breakfast.    . lisinopril (PRINIVIL,ZESTRIL) 20 MG tablet Take 20 mg by mouth daily.    [name] lisinopril-hydrochlorothiazide (PRINZIDE,ZESTORETIC) 20-25 MG tablet TAKE ONE TABLET BY MOUTH EVERY DAY 30 tablet 8  . loratadine (CLARITIN) 10 MG tablet Take 10 mg by mouth daily.    . Multiple Vitamins-Minerals (MULTIVITAMIN ADULTS 50+) TABS Take 1 tablet by mouth daily.    . Omega-3 Fatty Acids (FISH OIL) 1200 MG CAPS Take 1,200 mg by mouth daily.    . potassium chloride (K-DUR,KLOR-CON) 10 MEQ tablet Take 10 mEq by mouth 3 (three) times daily.     . propranolol (INDERAL) 10 MG tablet Take one tablet every 30 minutes up to 4tablets maximum as needed for paroxysmal tachycardia 30 tablet 0   No current facility-administered medications for this visit.    Allergies:   Levaquin; Sulfa antibiotics; Benadryl; Lasix; and Meloxicam    Social History:  The patient  reports that she has quit smoking. She does not have any smokeless tobacco history on file. She reports that she does not drink alcohol or use illicit drugs.   Family History:  The patient's family history includes Stroke in her father and paternal grandmother.    ROS:  Please see the history of present illness.   Otherwise, review of systems are positive for none.   All other systems are reviewed and negative.    PHYSICAL EXAM: VS:  BP 124/76 mmHg  Pulse 69  Ht 5' (1.524 m)  Wt 186 lb 12 oz (84.709 kg)  BMI 36.47 kg/m2 , BMI Body mass index is 36.47 kg/(m^2). GEN: Well nourished, well developed, in no acute distress HEENT: normal Neck: no JVD, carotid bruits, or masses Cardiac: RRR; no  rubs, or gallops,no edema .  is a 2/6 systolic ejection murmur in the aortic area.  Respiratory:  clear to auscultation bilaterally, normal work of breathing GI: soft, nontender, nondistended, + BS MS: no deformity or atrophy Skin: warm  and dry, no rash Neuro:  Strength and sensation are intact Psych: euthymic mood, full affect   EKG:  EKG is ordered today. The ekg ordered today demonstrateNormal sinus rhythm with minimal LVH and nonspecific ST changes.  Recent Labs: 01/11/2015: Magnesium 1.8; TSH 6.228* 10/26/2015: ALT 19; B Natriuretic Peptide 35.0; BUN 11; Creatinine, Ser 0.54; Hemoglobin 12.5; Platelets 298; Potassium 3.3*; Sodium 131*    Lipid Panel No results found for: CHOL, TRIG, HDL, CHOLHDL, VLDL, LDLCALC, LDLDIRECT    Wt Readings from Last 3 Encounters:  11/22/15 186 lb 12 oz (84.709 kg)  10/30/15 182 lb 8 oz (82.781 kg)  10/26/15 181 lb (82.101 kg)  ASSESSMENT AND PLAN:  1.  Persistent atrial fibrillation: The patient is having very frequent episodes of atrial fibrillation in spite of treatment with diltiazem and breakthrough propranolol. I agree that her syncopal episode was likely due to postconversion pauses. I discussed different management options with her including catheter ablation or antiarrhythmic therapy. The patient reports difficulty with transportation to her appointments and prefers to have everything locally if possible. I agree that amiodarone is not a good option due to her chronic respiratory failure. Options include class IC drugs or dofetilide. I elected to start with flecainide 50 mg twice daily. Follow-up in one week with an EKG and if needed increasing the dose to 100 mg twice daily. Continue anticoagulation.  2. Essential hypertension: Blood pressure is well controlled on current medications.  3. History of aortic valve replacement with bioprosthetic valve: This was functioning normally by echo in May 2016.      Disposition:   FU with PA/NP in 1 week  Signed,  [name], MD  11/22/2015 3:10 PM    [place]

## 2015-11-22 NOTE — Patient Instructions (Signed)
Medication Instructions:  Your physician has recommended you make the following change in your medication:  1. Flecainide 50 mg Twice daily 2. Decreased Diltiazem to 180 mg Once Daily   Labwork: None Ordered  Testing/Procedures: None ordered  Follow-Up: Your physician recommends that you schedule a follow-up appointment in: 1 week with Nurse Practitioner or Physician Assistant with EKG  Date & Time:_______________________________________________________________   Your physician recommends that you schedule a follow-up appointment in: 3 months with Dr. Fletcher Anon  Date & Time:________________________________________________________________   Any Other Special Instructions Will Be Listed Below (If Applicable).     If you need a refill on your cardiac medications before your next appointment, please call your pharmacy.

## 2015-11-28 ENCOUNTER — Encounter (INDEPENDENT_AMBULATORY_CARE_PROVIDER_SITE_OTHER): Payer: Self-pay

## 2015-11-28 ENCOUNTER — Ambulatory Visit: Payer: Medicare Other | Admitting: Physician Assistant

## 2015-11-28 ENCOUNTER — Encounter: Payer: Self-pay | Admitting: Physician Assistant

## 2015-11-28 ENCOUNTER — Ambulatory Visit (INDEPENDENT_AMBULATORY_CARE_PROVIDER_SITE_OTHER): Payer: Medicare Other | Admitting: Physician Assistant

## 2015-11-28 VITALS — BP 126/64 | HR 69 | Ht 60.0 in | Wt 185.2 lb

## 2015-11-28 DIAGNOSIS — I480 Paroxysmal atrial fibrillation: Secondary | ICD-10-CM | POA: Diagnosis not present

## 2015-11-28 DIAGNOSIS — Z9189 Other specified personal risk factors, not elsewhere classified: Secondary | ICD-10-CM | POA: Diagnosis not present

## 2015-11-28 DIAGNOSIS — J9621 Acute and chronic respiratory failure with hypoxia: Secondary | ICD-10-CM | POA: Diagnosis not present

## 2015-11-28 DIAGNOSIS — I10 Essential (primary) hypertension: Secondary | ICD-10-CM | POA: Diagnosis not present

## 2015-11-28 DIAGNOSIS — Z87898 Personal history of other specified conditions: Secondary | ICD-10-CM

## 2015-11-28 NOTE — Patient Instructions (Addendum)
Medication Instructions:  Your physician recommends that you continue on your current medications as directed. Please refer to the Current Medication list given to you today.   Labwork: None Ordered  Testing/Procedures: None Ordered  Follow-Up: Your physician wants you to follow-up 02/28/16 with Dr. Fletcher Anon. You will receive a reminder letter in the mail two months in advance. If you don't receive a letter, please call our office to schedule the follow-up appointment.   Any Other Special Instructions Will Be Listed Below (If Applicable).     If you need a refill on your cardiac medications before your next appointment, please call your pharmacy.

## 2015-11-28 NOTE — Progress Notes (Signed)
Cardiology Office Note Date:  11/28/2015  Patient ID:  [name], [name] 02/23/1946, MRN [id number] PCP:  [name], MD  Cardiologist:  Dr. Fletcher Anon, MD    Chief Complaint: Follow up Afib  History of Present Illness: [name] is a 70 y.o. 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 Cardiac catheterization before valve replacement in 2008 showed no obstructive coronary artery disease.She was hospitalized in May 2016, with atrial fibrillation with rapid ventricular response in the setting of upper respiratory tract infection.She converted to normal sinus rhythm with diltiazem. Echocardiogram showed normal LV systolic function with normal functioning of bioprosthetic aortic valve with mildly increased gradient. She was started on anticoagulation with Eliquis. She had been taking diltiazem extended release 240 mg once daily since then until recently which was decreased to 180 mg daily. She was given propranolol for breakthrough tachycardia. At her follow up on 10/24/15 she reported episodes of atrial fibrillation in the last 6 months most recently was about one month prior which lasted about 30 minutes. She took propranolol with good response. Typically these episodes happen in the setting of stress and anxiety. She had been having significant issues with her daughter. Otherwise, she had been doing reasonably well. Dyspnea was stable and she continued to be on home oxygen. She was seen in the ED on 10/26/15 with Afib with RVR with heart rate in the 140's upon waking up. She took her usual prn propranolol, but after the thrid dose her  heart rate was still in the 140's. She subsequently got up from a seated position and suffered a syncopal episode. EKG showed sinus bradycardia with a heart rate of 57 bpm, no acute st/t changes. Troponin 0.04-->0.03. K+ 3.3, Na 131. BNP 35. She was diagnosed with syncope and discharged from the ED. She was orthostatic at her follow up on 10/30/15. It was felt her episodes may have been post termination pauses. She underwent a Lexiscan Myoview on 11/05/15 that was low risk and showed no evidence of ischemia. In follow up with Dr. Fletcher Anon, MD she was given the option of antiarrhythmic vs ablation and she decided to start Flecainide 50 mg bid. Since then she has felt much better. She has not felt like she was in Afib. No further syncopal events. She has not missed any doses of her medications. She does feel like she needs to decrease her stressors at home.    Past Medical History  Diagnosis Date  . COPD (chronic obstructive pulmonary disease) ([place])     a. on home O2 at 2L since 2008  . HLD (hyperlipidemia)   . HTN (hypertension)   . HLD (hyperlipidemia)   . 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 Basal skull fracture ([name]) 20 yrs ago  . Aortic stenosis due to bicuspid aortic valve     a. s/p bioprosthetic valve replacement 2008 at [facility]  . S/P ascending aortic aneurysm repair 2008  . Obesity   . Thyroid disease   . PAF (paroxysmal atrial fibrillation) ([name])     a. on Eliquis; b. CHADS2VASc = 3 (HTN,  age x 1, female)  . Chronic respiratory failure [facility])     Past Surgical History  Procedure Laterality Date  . Abdominal aortic aneurysm repair  2008  . Aortic valve replacement  2008  . Abdominal hysterectomy    . Carpal tunnel release    . Tumor excision Left     x3 (arm)  . Cardiac catheterization      [facility]    Current Outpatient Prescriptions  Medication Sig Dispense Refill  . albuterol-ipratropium (COMBIVENT) 18-103 MCG/ACT inhaler Inhale 1 puff into the lungs 3  (three) times daily as needed for wheezing or shortness of breath.    . ALPRAZolam (XANAX) 0.25 MG tablet Take 0.5 mg by mouth at bedtime as needed for anxiety.     [name] apixaban (ELIQUIS) 5 MG TABS tablet Take 1 tablet (5 mg total) by mouth 2 (two) times daily. 60 tablet 6  . atorvastatin (LIPITOR) 40 MG tablet Take 40 mg by mouth daily.    . beclomethasone (QVAR) 80 MCG/ACT inhaler Inhale 1 puff into the lungs 2 (two) times daily.    [name] CALCIUM-VITAMIN D PO Take by mouth daily.    [name] diltiazem (CARDIZEM CD) 180 MG 24 hr capsule Take 1 capsule (180 mg total) by mouth daily. 30 capsule 11  . DULoxetine (CYMBALTA) 60 MG capsule Take 60 mg by mouth daily.    Noelle Penner FIBER SUPPLEMENT PO Take 1 tablet by mouth daily.    . flecainide (TAMBOCOR) 50 MG tablet Take 1 tablet (50 mg total) by mouth 2 (two) times daily. 60 tablet 11  . IRON, FERROUS GLUCONATE, PO Take 2 tablets by mouth daily.    [name] levothyroxine (SYNTHROID, LEVOTHROID) 25 MCG tablet Take 25 mcg by mouth daily before breakfast.    . lisinopril-hydrochlorothiazide (PRINZIDE,ZESTORETIC) 20-25 MG tablet TAKE ONE TABLET BY MOUTH EVERY DAY 30 tablet 8  . loratadine (CLARITIN) 10 MG tablet Take 10 mg by mouth daily.    . Multiple Vitamins-Minerals (MULTIVITAMIN ADULTS 50+) TABS Take 1 tablet by mouth daily.    . Omega-3 Fatty Acids (FISH OIL) 1200 MG CAPS Take 1,200 mg by mouth daily.    . potassium chloride (K-DUR,KLOR-CON) 10 MEQ tablet Take 10 mEq by mouth 3 (three) times daily.     . propranolol (INDERAL) 10 MG tablet Take one tablet every 30 minutes up to 4tablets maximum as needed for paroxysmal tachycardia 30 tablet 0   No current facility-administered medications for this visit.    Allergies:   Levaquin; Sulfa antibiotics; Benadryl; Lasix; and Meloxicam   Social History:  The patient  reports that she has quit smoking. She does not have any smokeless tobacco history on file. She reports that she does not drink alcohol or use illicit drugs.    Family History:  The patient's family history includes Stroke in her father and paternal grandmother.  ROS:   Review of Systems  Constitutional: Negative for fever, chills, weight loss, malaise/fatigue and diaphoresis.  HENT: Negative for congestion.   Eyes: Negative for discharge and redness.  Respiratory: Negative for cough, hemoptysis, sputum production, shortness of breath and wheezing.   Cardiovascular: Negative for chest pain, palpitations, orthopnea, claudication, leg swelling and PND.  Gastrointestinal: Negative for nausea, vomiting and abdominal pain.  Musculoskeletal: Negative for myalgias and falls.  Skin: Negative for rash.  Neurological: Negative for dizziness, tingling, tremors, sensory change, speech change, focal weakness, loss of consciousness and weakness.  Endo/Heme/Allergies: Does not bruise/bleed easily.  Psychiatric/Behavioral: Negative for substance abuse. The  patient is not nervous/anxious.   All other systems reviewed and are negative.     PHYSICAL EXAM:  VS:  BP 126/64 mmHg  Pulse 69  Ht 5' (1.524 m)  Wt 185 lb 4 oz (84.029 kg)  BMI 36.18 kg/m2 BMI: Body mass index is 36.18 kg/(m^2). Well nourished, well developed, in no acute distress HEENT: normocephalic, atraumatic Neck: no JVD, carotid bruits or masses Cardiac:  normal S1, S2; RRR; no murmurs, rubs, or gallops Lungs:  clear to auscultation bilaterally, no wheezing, rhonchi or rales, on oxygen Abd: soft, nontender, no hepatomegaly, + BS MS: no deformity or atrophy Ext: no edema Skin: warm and dry, no rash Neuro:  moves all extremities spontaneously, no focal abnormalities noted, follows commands Psych: euthymic mood, full affect   EKG:  Was ordered today. Shows NSR, 69 bpm, poor R wave progression, nonspecific lateral st/t changes  Recent Labs: 01/11/2015: Magnesium 1.8; TSH 6.228* 10/26/2015: ALT 19; B Natriuretic Peptide 35.0; BUN 11; Creatinine, Ser 0.54; Hemoglobin 12.5; Platelets 298;  Potassium 3.3*; Sodium 131*  No results found for requested labs within last 365 days.   CrCl cannot be calculated (Patient has no serum creatinine result on file.).   Wt Readings from Last 3 Encounters:  11/28/15 185 lb 4 oz (84.029 kg)  11/22/15 186 lb 12 oz (84.709 kg)  10/30/15 182 lb 8 oz (82.781 kg)     Other studies reviewed: Additional studies/records reviewed today include: summarized above  ASSESSMENT AND PLAN:  1. PAF: She is in normal sinus rhythm today with a heart rate in the 60's. Feels much better. Continue Cardizem CD 180 mg daily, flecainide 50 mg bid, and prn propranolol. She has not needed to take any prn propranolol since she was last seen. Given that she is in sinus rhythm I will not increase flecainide at this time. Should she redevelop Afib at a later date this could be increased. Continue Eliquis 5 mg bid. The importance of not missing any doses of her anticoagulation was stressed. CHADS2VASc at least 3 (HTN, age, female). Decrease stressors.   2. History of syncope: No further events. No orthostatic symptoms. As above.   3. HTN: Well controlled. Continue current medications.   4. History of AVR with bioprosthetic valve: Stable by echo 01/2015.   5. Obesity: Weight loss advised. Likely playing a role in #1.   Disposition: F/u with Dr. Fletcher Anon, MD in 6 months  Current medicines are reviewed at length with the patient today.  The patient did not have any concerns regarding medicines.  Melvern Banker PA-C 11/28/2015 3:37 PM     [address], [place] [address] [phone]

## 2015-12-04 ENCOUNTER — Telehealth: Payer: Self-pay | Admitting: Cardiovascular Disease

## 2015-12-04 NOTE — Telephone Encounter (Signed)
S/w pt who reports elevated BP at home. Readings 150/71 yesterday and 160/73 today. Reviewed BP meds. She is taking as directed with no missed doses. At prior OV, BP WNL at 110/71, 124/76, and 126/64 Advised pt to take her BP cuff to [facility] and compare readings there w/her home cuff. If home BP cuff is different than in-store, she is agreeable to purchase new one. She states it could be this as she has had the cuff  "for a long time" She will also monitor BP x 5 days and call to report if readings continue to be elevated. Pt had no further questions.

## 2015-12-04 NOTE — Telephone Encounter (Signed)
Pt calling stating when she comes in the office her BP is good but when she goes home  Her BP stays high  160/73 a while ago  150/71 yesterday  Wants to know if this is suppose to be high or if she should be concerned  Please advise

## 2015-12-05 ENCOUNTER — Telehealth: Payer: Self-pay | Admitting: Cardiovascular Disease

## 2015-12-05 NOTE — Telephone Encounter (Signed)
Pt states she has an appt tomorrow at [facility] with her Heart Psychologist, sport and exercise. States her portable oxygen has went out and wont be shipped until tomorrow. Would like to ask if it is ok for her to go without her oxygen from 12 pm-2pm please call.

## 2015-12-05 NOTE — Telephone Encounter (Signed)
S/w pt who reports portable oxygen is empty and delivery is not expected until tomorrow afternoon. She has an appt at [facility] tomorrow and asks if it is ok to go without it for 5+ hours. Advised pt continue oxygen as directed and it will not be safe to go without  as she is dependent on it for COPD. Pt states she will call and reschedule tomorrow's appt.

## 2015-12-09 DIAGNOSIS — H04123 Dry eye syndrome of bilateral lacrimal glands: Secondary | ICD-10-CM | POA: Diagnosis not present

## 2015-12-11 ENCOUNTER — Telehealth: Payer: Self-pay | Admitting: Cardiovascular Disease

## 2015-12-11 NOTE — Telephone Encounter (Signed)
Pt is calling w her BP readings: 3/31 -7pm 151/84 4/1-   1:45 pm 140/73 4/2-    9 am 153/81 4/3-    4:15 pm 144/70 4/4-   11:15 pm 138/74  4/5-   10:45 160/83 States her HR has been ok, has been running between 66-70.  States she has had a bad headache between her eyes. Please call.

## 2015-12-11 NOTE — Telephone Encounter (Signed)
S/w pt who reports the following BPs (see note below). Per our discussion on 3/29, pt purchased a new BP cuff as BP WNL at OVs but she frequently reports elevated BPs at home. Takes lisinopril-HCTZ 20-'25mg'$  daily. No missed doses. Reviewed low sodium diet. Pt states she has been compliant. Advised pt to continue meds, follow low sodium diet and I will forward to MD to advise. Pt agreeable w/plan.

## 2015-12-12 NOTE — Telephone Encounter (Signed)
BP has been fine during 2 last office visits. She might be anxious at home. I am hesitant to add or increased meds. Continue same medications.

## 2015-12-13 NOTE — Telephone Encounter (Signed)
S/w pt who will "admit I am a worrier". States she worries about her grand-daughter. She worries if something were to happen to her, her daughters would not know what to do. 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  Reviewed ways to calm herself. States she knows what to do but it is difficult. States she will try. She verbalized understanding and will notify us if BP remains elevated.  Pt is appreciative of the call.

## 2015-12-16 ENCOUNTER — Other Ambulatory Visit: Payer: Self-pay | Admitting: Cardiovascular Disease

## 2015-12-25 DIAGNOSIS — H04123 Dry eye syndrome of bilateral lacrimal glands: Secondary | ICD-10-CM | POA: Diagnosis not present

## 2015-12-26 DIAGNOSIS — I4891 Unspecified atrial fibrillation: Secondary | ICD-10-CM | POA: Diagnosis not present

## 2015-12-26 DIAGNOSIS — J069 Acute upper respiratory infection, unspecified: Secondary | ICD-10-CM | POA: Diagnosis not present

## 2015-12-26 DIAGNOSIS — R05 Cough: Secondary | ICD-10-CM | POA: Diagnosis not present

## 2015-12-26 DIAGNOSIS — J4540 Moderate persistent asthma, uncomplicated: Secondary | ICD-10-CM | POA: Diagnosis not present

## 2015-12-26 DIAGNOSIS — I10 Essential (primary) hypertension: Secondary | ICD-10-CM | POA: Diagnosis not present

## 2016-01-10 DIAGNOSIS — H04321 Acute dacryocystitis of right lacrimal passage: Secondary | ICD-10-CM | POA: Diagnosis not present

## 2016-01-24 DIAGNOSIS — Z7901 Long term (current) use of anticoagulants: Secondary | ICD-10-CM | POA: Diagnosis not present

## 2016-01-24 DIAGNOSIS — Z79899 Other long term (current) drug therapy: Secondary | ICD-10-CM | POA: Diagnosis not present

## 2016-01-24 DIAGNOSIS — Z9981 Dependence on supplemental oxygen: Secondary | ICD-10-CM | POA: Diagnosis not present

## 2016-01-24 DIAGNOSIS — Z9889 Other specified postprocedural states: Secondary | ICD-10-CM | POA: Diagnosis not present

## 2016-01-24 DIAGNOSIS — Q231 Congenital insufficiency of aortic valve: Secondary | ICD-10-CM | POA: Diagnosis not present

## 2016-01-24 DIAGNOSIS — Z48812 Encounter for surgical aftercare following surgery on the circulatory system: Secondary | ICD-10-CM | POA: Diagnosis not present

## 2016-01-24 DIAGNOSIS — R5383 Other fatigue: Secondary | ICD-10-CM | POA: Diagnosis not present

## 2016-01-24 DIAGNOSIS — Z8679 Personal history of other diseases of the circulatory system: Secondary | ICD-10-CM | POA: Diagnosis not present

## 2016-01-24 DIAGNOSIS — I712 Thoracic aortic aneurysm, without rupture: Secondary | ICD-10-CM | POA: Diagnosis not present

## 2016-01-24 DIAGNOSIS — Z952 Presence of prosthetic heart valve: Secondary | ICD-10-CM | POA: Diagnosis not present

## 2016-01-27 DIAGNOSIS — H04123 Dry eye syndrome of bilateral lacrimal glands: Secondary | ICD-10-CM | POA: Diagnosis not present

## 2016-01-28 DIAGNOSIS — I4891 Unspecified atrial fibrillation: Secondary | ICD-10-CM | POA: Diagnosis not present

## 2016-01-28 DIAGNOSIS — J309 Allergic rhinitis, unspecified: Secondary | ICD-10-CM | POA: Diagnosis not present

## 2016-01-28 DIAGNOSIS — F411 Generalized anxiety disorder: Secondary | ICD-10-CM | POA: Diagnosis not present

## 2016-01-28 DIAGNOSIS — F331 Major depressive disorder, recurrent, moderate: Secondary | ICD-10-CM | POA: Diagnosis not present

## 2016-01-28 DIAGNOSIS — J4540 Moderate persistent asthma, uncomplicated: Secondary | ICD-10-CM | POA: Diagnosis not present

## 2016-01-28 DIAGNOSIS — Z9981 Dependence on supplemental oxygen: Secondary | ICD-10-CM | POA: Diagnosis not present

## 2016-02-16 ENCOUNTER — Other Ambulatory Visit: Payer: Self-pay | Admitting: Physician Assistant

## 2016-02-16 DIAGNOSIS — I480 Paroxysmal atrial fibrillation: Secondary | ICD-10-CM

## 2016-02-16 DIAGNOSIS — I10 Essential (primary) hypertension: Secondary | ICD-10-CM

## 2016-02-16 MED ORDER — DILTIAZEM HCL ER COATED BEADS 180 MG PO CP24: 180.0000 mg | ORAL_CAPSULE | Freq: Every day | ORAL | Status: DC

## 2016-02-16 NOTE — Progress Notes (Signed)
[name] is a 70 y.o. female with hx of PAF.  Daughter took some of her medicines, including diltiazem. Refill for Diltiazem sent to pharmacy. Richardson Dopp, PA-C   02/16/2016 11:12 AM

## 2016-02-17 ENCOUNTER — Telehealth: Payer: Self-pay | Admitting: Cardiovascular Disease

## 2016-02-17 ENCOUNTER — Other Ambulatory Visit: Payer: Self-pay | Admitting: *Deleted

## 2016-02-17 DIAGNOSIS — I480 Paroxysmal atrial fibrillation: Secondary | ICD-10-CM

## 2016-02-17 DIAGNOSIS — I10 Essential (primary) hypertension: Secondary | ICD-10-CM

## 2016-02-17 MED ORDER — DILTIAZEM HCL ER COATED BEADS 180 MG PO CP24: 180.0000 mg | ORAL_CAPSULE | Freq: Every day | ORAL | Status: DC
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
## 2016-02-17 NOTE — Telephone Encounter (Signed)
Requested Prescriptions   Signed Prescriptions Disp Refills  . diltiazem (CARDIZEM CD) 180 MG 24 hr capsule 30 capsule 3    Sig: Take 1 capsule (180 mg total) by mouth daily.    Authorizing Provider: [name] A    Ordering User: Britt Bottom

## 2016-02-17 NOTE — Telephone Encounter (Signed)
.   lisinopril (PRINIVIL,ZESTRIL) 20 MG tablet 30 tablet 0    Sig: Take 1 tablet (20 mg total) by mouth daily.    Authorizing Provider: [name] A    Ordering User: Britt Bottom

## 2016-02-26 DIAGNOSIS — G4733 Obstructive sleep apnea (adult) (pediatric): Secondary | ICD-10-CM | POA: Diagnosis not present

## 2016-02-28 ENCOUNTER — Encounter: Payer: Self-pay | Admitting: Cardiovascular Disease

## 2016-02-28 ENCOUNTER — Ambulatory Visit (INDEPENDENT_AMBULATORY_CARE_PROVIDER_SITE_OTHER): Payer: Medicare Other | Admitting: Cardiovascular Disease

## 2016-02-28 VITALS — BP 117/68 | HR 60 | Ht 60.0 in | Wt 186.0 lb

## 2016-02-28 DIAGNOSIS — R079 Chest pain, unspecified: Secondary | ICD-10-CM

## 2016-02-28 DIAGNOSIS — I4891 Unspecified atrial fibrillation: Secondary | ICD-10-CM | POA: Diagnosis not present

## 2016-02-28 NOTE — Patient Instructions (Signed)
Medication Instructions: Continue same medications.   Labwork: None.   Procedures/Testing: None.   Follow-Up: 6 months with Dr. Arida.   Any Additional Special Instructions Will Be Listed Below (If Applicable).     If you need a refill on your cardiac medications before your next appointment, please call your pharmacy.   

## 2016-02-28 NOTE — Progress Notes (Signed)
Cardiology Office Note   Date:  02/28/2016   ID:  [name], DOB [birth year], MRN [id number]  PCP:  [name], MD  Cardiologist:   [name], MD   Chief Complaint  Patient presents with  . other    3 month follow up. Meds reviewed by the patient verbally. Pt. c/o rapid heart beats.       History of Present Illness: [name] is a 70 y.o. female who presents for A follow-up visit regarding persistent atrial fibrillation maintained in sinus rhythm with flecainide  and valvular heart disease. 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  Cardiac catheterization before valve replacement showed no obstructive coronary artery disease.  Most recent echocardiogram in May 2016  showed normal LV systolic function with normal functioning of bioprosthetic aortic valve with mildly increased gradient.  Nuclear stress test in February 2017 was normal.  She has been doing well since she was started on flecainide with no significant episodes of atrial fibrillation. She continues to be under significant stress at home due to family issues. No chest pain or worsening dyspnea. She hasn't had to use propranolol in a while.   Past Medical History  Diagnosis Date  . COPD (chronic obstructive pulmonary disease) (Home)     a. on home O2 at 2L since 2008  . HLD (hyperlipidemia)   . HTN (hypertension)   . HLD (hyperlipidemia)   . 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 Basal skull fracture ([name]) 20 yrs ago  . Aortic stenosis due to bicuspid aortic valve     a. s/p bioprosthetic valve replacement 2008 at [facility]  . S/P ascending aortic aneurysm repair 2008  . Obesity   . Thyroid disease   . PAF (paroxysmal atrial  fibrillation) ([name])     a. on Eliquis; b. CHADS2VASc = 3 (HTN, age x 32, female)  . Chronic respiratory failure [facility])     Past Surgical History  Procedure Laterality Date  . Abdominal aortic aneurysm repair  2008  . Aortic valve replacement  2008  . Abdominal hysterectomy    . Carpal tunnel release    . Tumor excision Left     x3 (arm)  . Cardiac catheterization      [facility]     Current Outpatient Prescriptions  Medication Sig Dispense Refill  . albuterol-ipratropium (COMBIVENT) 18-103 MCG/ACT inhaler Inhale 1 puff into the lungs 3 (three) times daily as needed for wheezing or shortness of breath.    . ALPRAZolam (XANAX) 0.25 MG tablet Take 0.5 mg by mouth at bedtime as needed for anxiety.     [name] atorvastatin (LIPITOR) 40 MG tablet Take 40 mg by mouth daily.    . beclomethasone (QVAR) 80 MCG/ACT inhaler Inhale 1 puff into the lungs 2 (two) times daily.    [name] CALCIUM-VITAMIN D PO Take by mouth daily.    [name] diltiazem (CARDIZEM CD) 180 MG 24 hr capsule Take 1 capsule (180 mg total) by mouth daily. 30 capsule 3  . DULoxetine (CYMBALTA) 60 MG capsule Take 60 mg by mouth daily.    [name] ELIQUIS 5 MG TABS tablet TAKE ONE TABLET BY MOUTH TWICE DAILY 60 tablet 3  .  EQ FIBER SUPPLEMENT PO Take 1 tablet by mouth daily.    . flecainide (TAMBOCOR) 50 MG tablet Take 1 tablet (50 mg total) by mouth 2 (two) times daily. 60 tablet 11  . IRON, FERROUS GLUCONATE, PO Take 2 tablets by mouth daily.    [name] levothyroxine (SYNTHROID, LEVOTHROID) 25 MCG tablet Take 25 mcg by mouth daily before breakfast.    . lisinopril-hydrochlorothiazide (PRINZIDE,ZESTORETIC) 20-25 MG tablet TAKE ONE TABLET BY MOUTH EVERY DAY 30 tablet 8  . loratadine (CLARITIN) 10 MG tablet Take 10 mg by mouth daily.    . Multiple Vitamins-Minerals (MULTIVITAMIN ADULTS 50+) TABS Take 1 tablet by mouth daily.    . Omega-3 Fatty Acids (FISH OIL) 1200 MG CAPS Take 1,200 mg by mouth daily.    . potassium chloride (K-DUR,KLOR-CON) 10 MEQ tablet Take 10  mEq by mouth 3 (three) times daily.     . propranolol (INDERAL) 10 MG tablet Take one tablet every 30 minutes up to 4tablets maximum as needed for paroxysmal tachycardia 30 tablet 0   No current facility-administered medications for this visit.    Allergies:   Levaquin; Sulfa antibiotics; Benadryl; Lasix; and Meloxicam    Social History:  The patient  reports that she has quit smoking. She does not have any smokeless tobacco history on file. She reports that she does not drink alcohol or use illicit drugs.   Family History:  The patient's family history includes Stroke in her father and paternal grandmother.    ROS:  Please see the history of present illness.   Otherwise, review of systems are positive for none.   All other systems are reviewed and negative.    PHYSICAL EXAM: VS:  BP 117/68 mmHg  Pulse 60  Ht 5' (1.524 m)  Wt 186 lb (84.369 kg)  BMI 36.33 kg/m2 , BMI Body mass index is 36.33 kg/(m^2). GEN: Well nourished, well developed, in no acute distress HEENT: normal Neck: no JVD, carotid bruits, or masses Cardiac: RRR; no  rubs, or gallops,no edema .  is a 2/6 systolic ejection murmur in the aortic area.  Respiratory:  clear to auscultation bilaterally, normal work of breathing GI: soft, nontender, nondistended, + BS MS: no deformity or atrophy Skin: warm and dry, no rash Neuro:  Strength and sensation are intact Psych: euthymic mood, full affect   EKG:  EKG is ordered today. The ekg ordered today demonstrate Normal sinus rhythm with minimal LVH and nonspecific ST changes.  Recent Labs: 10/26/2015: ALT 19; B Natriuretic Peptide 35.0; BUN 11; Creatinine, Ser 0.54; Hemoglobin 12.5; Platelets 298; Potassium 3.3*; Sodium 131*    Lipid Panel No results found for: CHOL, TRIG, HDL, CHOLHDL, VLDL, LDLCALC, LDLDIRECT    Wt Readings from Last 3 Encounters:  02/28/16 186 lb (84.369 kg)  11/28/15 185 lb 4 oz (84.029 kg)  11/22/15 186 lb 12 oz (84.709 kg)          ASSESSMENT AND PLAN:  1.  Persistent atrial fibrillation: She is maintaining in sinus rhythm with small dose flecainide with significant improvement in breakthrough palpitations. I made no changes today. Continue anticoagulation.   2. Essential hypertension: Blood pressure is well controlled on current medications.  3. History of aortic valve replacement with bioprosthetic valve: This was functioning normally by echo in May 2016.   Disposition:   FU with me in 6 months   Signed,  [name], MD  02/28/2016 2:30 PM    [place]

## 2016-04-09 ENCOUNTER — Other Ambulatory Visit: Payer: Self-pay | Admitting: Cardiovascular Disease

## 2016-04-14 ENCOUNTER — Other Ambulatory Visit: Payer: Self-pay | Admitting: Physician Assistant

## 2016-04-14 DIAGNOSIS — J449 Chronic obstructive pulmonary disease, unspecified: Secondary | ICD-10-CM | POA: Diagnosis not present

## 2016-04-14 DIAGNOSIS — R42 Dizziness and giddiness: Secondary | ICD-10-CM | POA: Diagnosis not present

## 2016-04-14 DIAGNOSIS — J9611 Chronic respiratory failure with hypoxia: Secondary | ICD-10-CM | POA: Diagnosis not present

## 2016-04-14 DIAGNOSIS — G4733 Obstructive sleep apnea (adult) (pediatric): Secondary | ICD-10-CM | POA: Diagnosis not present

## 2016-04-20 ENCOUNTER — Ambulatory Visit
Admission: RE | Admit: 2016-04-20 | Discharge: 2016-04-20 | Disposition: A | Discharge status: Home / Self Care | Payer: Medicare Other | Source: Ambulatory Visit | Attending: Physician Assistant | Admitting: Physician Assistant

## 2016-04-20 DIAGNOSIS — Z8673 Personal history of transient ischemic attack (TIA), and cerebral infarction without residual deficits: Secondary | ICD-10-CM | POA: Diagnosis not present

## 2016-04-20 DIAGNOSIS — R42 Dizziness and giddiness: Secondary | ICD-10-CM | POA: Diagnosis not present

## 2016-04-23 DIAGNOSIS — Z9981 Dependence on supplemental oxygen: Secondary | ICD-10-CM | POA: Diagnosis not present

## 2016-04-23 DIAGNOSIS — F331 Major depressive disorder, recurrent, moderate: Secondary | ICD-10-CM | POA: Diagnosis not present

## 2016-04-23 DIAGNOSIS — I10 Essential (primary) hypertension: Secondary | ICD-10-CM | POA: Diagnosis not present

## 2016-04-23 DIAGNOSIS — I482 Chronic atrial fibrillation: Secondary | ICD-10-CM | POA: Diagnosis not present

## 2016-04-23 DIAGNOSIS — F411 Generalized anxiety disorder: Secondary | ICD-10-CM | POA: Diagnosis not present

## 2016-04-23 DIAGNOSIS — J449 Chronic obstructive pulmonary disease, unspecified: Secondary | ICD-10-CM | POA: Diagnosis not present

## 2016-04-23 DIAGNOSIS — Z0001 Encounter for general adult medical examination with abnormal findings: Secondary | ICD-10-CM | POA: Diagnosis not present

## 2016-04-27 ENCOUNTER — Other Ambulatory Visit: Payer: Self-pay | Admitting: Cardiovascular Disease

## 2016-04-27 DIAGNOSIS — D519 Vitamin B12 deficiency anemia, unspecified: Secondary | ICD-10-CM | POA: Diagnosis not present

## 2016-04-27 DIAGNOSIS — E559 Vitamin D deficiency, unspecified: Secondary | ICD-10-CM | POA: Diagnosis not present

## 2016-04-27 DIAGNOSIS — E039 Hypothyroidism, unspecified: Secondary | ICD-10-CM | POA: Diagnosis not present

## 2016-04-27 DIAGNOSIS — Z0001 Encounter for general adult medical examination with abnormal findings: Secondary | ICD-10-CM | POA: Diagnosis not present

## 2016-04-27 DIAGNOSIS — I10 Essential (primary) hypertension: Secondary | ICD-10-CM | POA: Diagnosis not present

## 2016-05-07 DIAGNOSIS — G4733 Obstructive sleep apnea (adult) (pediatric): Secondary | ICD-10-CM | POA: Diagnosis not present

## 2016-05-12 DIAGNOSIS — Z1211 Encounter for screening for malignant neoplasm of colon: Secondary | ICD-10-CM | POA: Diagnosis not present

## 2016-05-12 DIAGNOSIS — G4701 Insomnia due to medical condition: Secondary | ICD-10-CM | POA: Diagnosis not present

## 2016-05-12 DIAGNOSIS — I482 Chronic atrial fibrillation: Secondary | ICD-10-CM | POA: Diagnosis not present

## 2016-05-12 DIAGNOSIS — J9611 Chronic respiratory failure with hypoxia: Secondary | ICD-10-CM | POA: Diagnosis not present

## 2016-05-12 DIAGNOSIS — F411 Generalized anxiety disorder: Secondary | ICD-10-CM | POA: Diagnosis not present

## 2016-05-18 DIAGNOSIS — Z1231 Encounter for screening mammogram for malignant neoplasm of breast: Secondary | ICD-10-CM | POA: Diagnosis not present

## 2016-05-21 DIAGNOSIS — L821 Other seborrheic keratosis: Secondary | ICD-10-CM | POA: Diagnosis not present

## 2016-05-21 DIAGNOSIS — L820 Inflamed seborrheic keratosis: Secondary | ICD-10-CM | POA: Diagnosis not present

## 2016-05-25 DIAGNOSIS — M810 Age-related osteoporosis without current pathological fracture: Secondary | ICD-10-CM | POA: Diagnosis not present

## 2016-05-25 DIAGNOSIS — E2839 Other primary ovarian failure: Secondary | ICD-10-CM | POA: Diagnosis not present

## 2016-06-02 DIAGNOSIS — Z23 Encounter for immunization: Secondary | ICD-10-CM | POA: Diagnosis not present

## 2016-06-07 DIAGNOSIS — G4733 Obstructive sleep apnea (adult) (pediatric): Secondary | ICD-10-CM | POA: Diagnosis not present

## 2016-06-16 DIAGNOSIS — K589 Irritable bowel syndrome without diarrhea: Secondary | ICD-10-CM | POA: Diagnosis not present

## 2016-06-16 DIAGNOSIS — K6289 Other specified diseases of anus and rectum: Secondary | ICD-10-CM | POA: Diagnosis not present

## 2016-06-22 ENCOUNTER — Telehealth: Payer: Self-pay | Admitting: Cardiovascular Disease

## 2016-06-22 NOTE — Telephone Encounter (Signed)
Pt calling stating she's been having dizzy spells then gets sick to her stomach and has some shakes , then her heart starts to flutter. This is been going off and on for about 3 weeks This morning it was really bad. She drank some ginger ale and her eye felt like it just kept rolling.  Not sure what could be causing this Would like some advise on this.

## 2016-06-23 NOTE — Telephone Encounter (Signed)
Pt reports dizzy spells, accompanied by nausea and heart fluttering for the past 2-3 weeks. Happens right after getting up in the morning off and on and "lasts most all of the day".  No changes in medication, no missed doses.  BP and HR have been stable. 145/78 HR 66; 129/72 HR 71  She feels it could be inner ear issues as she has seen PCP for this in the past.  Advised pt to f/u w/PCP for inner ear and call back if HR or BP elevates or sx persist. She is agreeable w/plan and is appreciative of the call.

## 2016-06-23 NOTE — Telephone Encounter (Signed)
Left message on machine for patient to contact the office.   

## 2016-07-08 DIAGNOSIS — G4733 Obstructive sleep apnea (adult) (pediatric): Secondary | ICD-10-CM | POA: Diagnosis not present

## 2016-07-09 DIAGNOSIS — R150 Incomplete defecation: Secondary | ICD-10-CM | POA: Diagnosis not present

## 2016-07-09 DIAGNOSIS — K6289 Other specified diseases of anus and rectum: Secondary | ICD-10-CM | POA: Diagnosis not present

## 2016-08-07 DIAGNOSIS — G4733 Obstructive sleep apnea (adult) (pediatric): Secondary | ICD-10-CM | POA: Diagnosis not present

## 2016-08-08 ENCOUNTER — Telehealth: Payer: Self-pay | Admitting: Internal Medicine

## 2016-08-08 NOTE — Telephone Encounter (Signed)
Ms. Cossey called the after hours line regarding left shoulder pain and her heart rate being 115.  Patient states that she feels her heart racing but it has stayed at 115 bps.  She tried taking her propranolol (x4) without any changes in her heart rate.  She denies feeling lightheaded or dizzy with her heart rate.  She does have back pain near her shoulder blade that is new for her.  Informed patient that she can take tylenol for pain.  Advised her not to take any additional propranolol at this time.  Liborio Nixon, MD

## 2016-08-10 ENCOUNTER — Other Ambulatory Visit: Payer: Self-pay

## 2016-08-10 ENCOUNTER — Telehealth: Payer: Self-pay | Admitting: Cardiovascular Disease

## 2016-08-10 DIAGNOSIS — I10 Essential (primary) hypertension: Secondary | ICD-10-CM

## 2016-08-10 DIAGNOSIS — I480 Paroxysmal atrial fibrillation: Secondary | ICD-10-CM

## 2016-08-10 MED ORDER — APIXABAN 5 MG PO TABS: 5.0000 mg | ORAL_TABLET | Freq: Two times a day (BID) | ORAL | 3 refills | Status: DC

## 2016-08-10 MED ORDER — FLECAINIDE ACETATE 100 MG PO TABS: 100.0000 mg | ORAL_TABLET | Freq: Two times a day (BID) | ORAL | 3 refills | Status: DC

## 2016-08-10 NOTE — Telephone Encounter (Signed)
Pt reports HR is now 96 and she is feeling better. Reviewed MD recommendations w/pt who verbalized understanding. New flecainide prescription and eliquis refill sent to [facility].

## 2016-08-10 NOTE — Telephone Encounter (Signed)
Increase Flecainide to 100 mg bid.

## 2016-08-10 NOTE — Telephone Encounter (Signed)
Pt came into the office c/o elevated HR since December 2. States she called the office several times this morning but was continuously re-routed to an on-call line therefore, she drove to the office.. She has hx of afib; takes eliquis '5mg'$ , flecainide '50mg'$  BID and diltiazem '180mg'$  qd and has been feeling fine recently except for some home stressors r/t her daughter. Saturday her HR 112-120s accompanied by left shoulder pain. She took propranolol x4 w/no improvement. On-call cardiologist recommended tylenol for pain and to refrain from any more propranolol at the time.  She reports HR 130 today; denies any other sx. She would like an appt w/Dr. Fletcher Anon and is agreeable to Dec 5 @ 3:30pm.  She does not appear to be in any distress other than being anxious however, I suggested she proceed to ER if she feels sx need immediate attention. Pt states she will not go to the ER as "they never do anything" and verbalized understanding to continue all medications as prescribed, relax to help lower HR, monitor BP/HR and seek immediate attn if sx worsen.  She has an expired propranolol prescription from July 2016 prescribed by Dr. Acie Fredrickson.  Will forward to MD to review and advise for possible med changes.

## 2016-08-11 ENCOUNTER — Ambulatory Visit: Payer: Medicare Other | Admitting: Cardiovascular Disease

## 2016-08-11 DIAGNOSIS — G4733 Obstructive sleep apnea (adult) (pediatric): Secondary | ICD-10-CM | POA: Diagnosis not present

## 2016-08-12 ENCOUNTER — Telehealth: Payer: Self-pay | Admitting: Cardiovascular Disease

## 2016-08-12 NOTE — Telephone Encounter (Signed)
Pt calling stating we upped her does on Flecainide  And it has helped but now her heart beat she states is "pounding"  This started Saturday would like to know what can we do to help with this Please advise.

## 2016-08-12 NOTE — Telephone Encounter (Signed)
Pt states increased flecainide dosage has helped "some" however, pounding HR wakes her up in the morning and continues throughout the day.  HR 122bpm when she woke up.  She has been taking flecainide '100mg'$  BID since Monday with little improvement. Reports HR fluctuates from 90s-110s throughout the day.  I offered her 12/7, 10:45am appt w/Dr. Fletcher Anon. She is agreeable and understands to proceed to the ER if sx worsen.

## 2016-08-13 ENCOUNTER — Encounter: Payer: Self-pay | Admitting: Cardiovascular Disease

## 2016-08-13 ENCOUNTER — Other Ambulatory Visit
Admission: RE | Admit: 2016-08-13 | Discharge: 2016-08-13 | Disposition: A | Discharge status: Home / Self Care | Payer: Medicare Other | Source: Ambulatory Visit | Attending: Cardiovascular Disease | Admitting: Cardiovascular Disease

## 2016-08-13 ENCOUNTER — Ambulatory Visit (INDEPENDENT_AMBULATORY_CARE_PROVIDER_SITE_OTHER): Payer: Medicare Other | Admitting: Cardiovascular Disease

## 2016-08-13 VITALS — BP 143/81 | HR 88 | Ht 60.0 in | Wt 187.5 lb

## 2016-08-13 DIAGNOSIS — I10 Essential (primary) hypertension: Secondary | ICD-10-CM

## 2016-08-13 DIAGNOSIS — Z01812 Encounter for preprocedural laboratory examination: Secondary | ICD-10-CM

## 2016-08-13 DIAGNOSIS — I4891 Unspecified atrial fibrillation: Secondary | ICD-10-CM | POA: Diagnosis not present

## 2016-08-13 DIAGNOSIS — I483 Typical atrial flutter: Secondary | ICD-10-CM | POA: Diagnosis not present

## 2016-08-13 LAB — CBC
HCT: 37 % (ref 35.0–47.0)
Hemoglobin: 12.5 g/dL (ref 12.0–16.0)
MCH: 28.3 pg (ref 26.0–34.0)
MCHC: 33.9 g/dL (ref 32.0–36.0)
MCV: 83.6 fL (ref 80.0–100.0)
Platelets: 275 10*3/uL (ref 150–440)
RBC: 4.43 MIL/uL (ref 3.80–5.20)
RDW: 16.6 % — ABNORMAL HIGH (ref 11.5–14.5)
WBC: 6.2 10*3/uL (ref 3.6–11.0)

## 2016-08-13 LAB — BASIC METABOLIC PANEL
Anion gap: 10 (ref 5–15)
BUN: 12 mg/dL (ref 6–20)
CO2: 26 mmol/L (ref 22–32)
Calcium: 9.5 mg/dL (ref 8.9–10.3)
Chloride: 98 mmol/L — ABNORMAL LOW (ref 101–111)
Creatinine, Ser: 0.76 mg/dL (ref 0.44–1.00)
GFR calc Af Amer: >60 mL/min (ref >60)
GFR calc non Af Amer: >60 mL/min (ref >60)
Glucose, Bld: 106 mg/dL — ABNORMAL HIGH (ref 65–99)
Potassium: 3.5 mmol/L (ref 3.5–5.1)
Sodium: 134 mmol/L — ABNORMAL LOW (ref 135–145)

## 2016-08-13 LAB — PROTIME-INR
INR: 1.19
Prothrombin Time: 15.2 seconds (ref 11.4–15.2)

## 2016-08-13 NOTE — Progress Notes (Signed)
Cardiology Office Note   Date:  08/13/2016   ID:  [name], DOB [birth year], MRN [id number]  PCP:  [name], MD  Cardiologist:   [name], MD   Chief Complaint  Patient presents with  . other     F/u flutters/palpitations. Pt c/o dizziness, falling out of bed as well. Reviewed meds with pt verbally.      History of Present Illness: [name] is a 70 y.o. female who presents for A follow-up visit regarding persistent atrial fibrillation and valvular heart disease. 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  Cardiac catheterization before valve replacement showed no obstructive coronary artery disease.  Most recent echocardiogram in May 2016  showed normal LV systolic function with normal functioning of bioprosthetic aortic valve with mildly increased gradient.  Nuclear stress test in February 2017 was normal.  Her atrial fibrillation was controlled with small dose flecainide up until recently when she started having intermittent palpitations and tachycardia associated with worsening shortness of breath. She is usually very symptomatic when her heart rate is increased. We increased the dose of flecainide 100 mg twice daily but her symptoms persisted. She denies any chest discomfort.   Past Medical History:  Diagnosis Date  . 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 s/p bioprosthetic valve replacement 2008 at [facility]  . Basal skull fracture ([name]) 20 yrs ago  . Chronic respiratory failure ([place])   . COPD (chronic obstructive pulmonary disease) ([place])    a. on home O2 at 2L since 2008  . 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 HLD (hyperlipidemia)   . HLD (hyperlipidemia)   . HTN  (hypertension)   . Obesity   . PAF (paroxysmal atrial fibrillation) ([name])    a. on Eliquis; b. CHADS2VASc = 3 (HTN, age x 31, female)  . S/P ascending aortic aneurysm repair 2008  . Thyroid disease     Past Surgical History:  Procedure Laterality Date  . ABDOMINAL AORTIC ANEURYSM REPAIR  2008  . ABDOMINAL HYSTERECTOMY    . AORTIC VALVE REPLACEMENT  2008  . CARDIAC CATHETERIZATION     [facility]  . CARPAL TUNNEL RELEASE    . TUMOR EXCISION Left    x3 (arm)     Current Outpatient Prescriptions  Medication Sig Dispense Refill  . albuterol-ipratropium (COMBIVENT) 18-103 MCG/ACT inhaler Inhale 1 puff into the lungs 3 (three) times daily as needed for wheezing or shortness of breath.    . ALPRAZolam (XANAX) 0.25 MG tablet Take 0.5 mg by mouth at bedtime as needed for anxiety.     [name] apixaban (ELIQUIS) 5 MG TABS tablet Take 1 tablet (5 mg total) by mouth 2 (two) times daily. 60 tablet 3  . atorvastatin (LIPITOR) 40 MG tablet Take 40 mg by mouth daily.    . beclomethasone (QVAR) 80 MCG/ACT inhaler Inhale 1 puff into the lungs 2 (two) times daily.    [name] CALCIUM-VITAMIN D PO Take by mouth daily.    [name] diltiazem (CARDIZEM CD) 180 MG 24 hr capsule Take 1 capsule (180 mg total) by mouth daily. 30 capsule 3  . DULoxetine (CYMBALTA) 60 MG capsule Take 60 mg by  mouth daily.    Noelle Penner FIBER SUPPLEMENT PO Take 1 tablet by mouth daily.    . flecainide (TAMBOCOR) 100 MG tablet Take 1 tablet (100 mg total) by mouth 2 (two) times daily. 60 tablet 3  . IRON, FERROUS GLUCONATE, PO Take 2 tablets by mouth daily.    [name] levothyroxine (SYNTHROID, LEVOTHROID) 25 MCG tablet Take 25 mcg by mouth daily before breakfast.    . lisinopril-hydrochlorothiazide (PRINZIDE,ZESTORETIC) 20-25 MG tablet TAKE ONE TABLET BY MOUTH EVERY DAY 30 tablet 3  . loratadine (CLARITIN) 10 MG tablet Take 10 mg by mouth daily.    . Multiple Vitamins-Minerals (MULTIVITAMIN ADULTS 50+) TABS Take 1 tablet by mouth daily.    . Omega-3 Fatty Acids  (FISH OIL) 1200 MG CAPS Take 1,200 mg by mouth daily.    . potassium chloride (K-DUR,KLOR-CON) 10 MEQ tablet Take 10 mEq by mouth 3 (three) times daily.     . propranolol (INDERAL) 10 MG tablet Take one tablet every 30 minutes up to 4tablets maximum as needed for paroxysmal tachycardia 30 tablet 0   No current facility-administered medications for this visit.     Allergies:   Levaquin [levofloxacin in d5w]; Sulfa antibiotics; Benadryl [diphenhydramine hcl (sleep)]; Lasix [furosemide]; and Meloxicam    Social History:  The patient  reports that she has quit smoking. She has never used smokeless tobacco. She reports that she does not drink alcohol or use drugs.   Family History:  The patient's family history includes Stroke in her father and paternal grandmother.    ROS:  Please see the history of present illness.   Otherwise, review of systems are positive for none.   All other systems are reviewed and negative.    PHYSICAL EXAM: VS:  BP (!) 143/81 (BP Location: Left Arm, Patient Position: Sitting, Cuff Size: Normal)   Pulse 88   Ht 5' (1.524 m)   Wt 187 lb 8 oz (85 kg)   BMI 36.62 kg/m  , BMI Body mass index is 36.62 kg/m. GEN: Well nourished, well developed, in no acute distress  HEENT: normal  Neck: no JVD, carotid bruits, or masses Cardiac: RRR; no  rubs, or gallops,no edema .  is a 2/6 systolic ejection murmur in the aortic area.  Respiratory:  clear to auscultation bilaterally, normal work of breathing GI: soft, nontender, nondistended, + BS MS: no deformity or atrophy  Skin: warm and dry, no rash Neuro:  Strength and sensation are intact Psych: euthymic mood, full affect   EKG:  EKG is ordered today. Atrial flutter with variable AV block, LVH. Ventricular rate is 89 bpm.  Recent Labs: 10/26/2015: ALT 19; B Natriuretic Peptide 35.0; BUN 11; Creatinine, Ser 0.54; Hemoglobin 12.5; Platelets 298; Potassium 3.3; Sodium 131    Lipid Panel No results found for: CHOL, TRIG,  HDL, CHOLHDL, VLDL, LDLCALC, LDLDIRECT    Wt Readings from Last 3 Encounters:  08/13/16 187 lb 8 oz (85 kg)  02/28/16 186 lb (84.4 kg)  11/28/15 185 lb 4 oz (84 kg)         ASSESSMENT AND PLAN:  1.  Atrial flutter: The patient seems to be very symptomatic with tachycardia. Her previous diagnosis was atrial fibrillation and I could not find the EKGs from May 2016 to review. I'm wondering if she had atrial flutter at that time or if she developed atrial flutter while she is on flecainide. She has been anticoagulated with Eliquis and she has not missed any doses. Given that she is  highly symptomatic, I recommend proceeding with cardioversion. The patient has COPD on home oxygen and I don't think she would be a good candidate for ablation for that reason.  2. Essential hypertension: Blood pressure is well controlled on current medications.  3. History of aortic valve replacement with bioprosthetic valve: This was functioning normally by echo in May 2016.   Disposition:   FU with me after cardioversion  Signed,  [name], MD  08/13/2016 11:08 AM    [place]

## 2016-08-13 NOTE — Patient Instructions (Addendum)
Medication Instructions:  Your physician recommends that you continue on your current medications as directed. Please refer to the Current Medication list given to you today.   Labwork: BMET, CBC, PT/INR today at the [facility]  Testing/Procedures: Your physician has recommended that you have a Cardioversion (DCCV). Electrical Cardioversion uses a jolt of electricity to your heart either through paddles or wired patches attached to your chest. This is a controlled, usually prescheduled, procedure. Defibrillation is done under light anesthesia in the hospital, and you usually go home the day of the procedure. This is done to get your heart back into a normal rhythm. You are not awake for the procedure. Please see the instruction sheet given to you today.  Monday, December 11 Arrival time: 6:30am [name] entrance  Nothing to eat or drink after midnight. Take all of your morning medications EXCEPT diltiazem. Please have someone with you to drive you home.   Follow-Up: Your physician recommends that you schedule a follow-up appointment in: 2 weeks with Dr. Fletcher Anon.    Any Other Special Instructions Will Be Listed Below (If Applicable).     If you need a refill on your cardiac medications before your next appointment, please call your pharmacy.   Electrical Cardioversion Electrical cardioversion is the delivery of a jolt of electricity to restore a normal rhythm to the heart. A rhythm that is too fast or is not regular keeps the heart from pumping well. In this procedure, sticky patches or metal paddles are placed on the chest to deliver electricity to the heart from a device. This procedure may be done in an emergency if:  There is low or no blood pressure as a result of the heart rhythm.  Normal rhythm must be restored as fast as possible to protect the brain and heart from further damage.  It may save a life. This procedure may also be done  for irregular or fast heart rhythms that are not immediately life-threatening. Tell a health care provider about:  Any allergies you have.  All medicines you are taking, including vitamins, herbs, eye drops, creams, and over-the-counter medicines.  Any problems you or family members have had with anesthetic medicines.  Any blood disorders you have.  Any surgeries you have had.  Any medical conditions you have.  Whether you are pregnant or may be pregnant. What are the risks? Generally, this is a safe procedure. However, problems may occur, including:  Allergic reactions to medicines.  A blood clot that breaks free and travels to other parts of your body.  The possible return of an abnormal heart rhythm within hours or days after the procedure.  Your heart stopping (cardiac arrest). This is rare. What happens before the procedure? Medicines  Your health care provider may have you start taking:  Blood-thinning medicines (anticoagulants) so your blood does not clot as easily.  Medicines may be given to help stabilize your heart rate and rhythm.  Ask your health care provider about changing or stopping your regular medicines. This is especially important if you are taking diabetes medicines or blood thinners. General instructions  Plan to have someone take you home from the hospital or clinic.  If you will be going home right after the procedure, plan to have someone with you for 24 hours.  Follow instructions from your health care provider about eating or drinking restrictions. What happens during the procedure?  To lower your risk of infection:  Your health care team will wash  or sanitize their hands.  Your skin will be washed with soap.  An IV tube will be inserted into one of your veins.  You will be given a medicine to help you relax (sedative).  Sticky patches (electrodes) or metal paddles may be placed on your chest.  An electrical shock will be  delivered. The procedure may vary among health care providers and hospitals. What happens after the procedure?  Your blood pressure, heart rate, breathing rate, and blood oxygen level will be monitored until the medicines you were given have worn off.  Do not drive for 24 hours if you were given a sedative.  Your heart rhythm will be watched to make sure it does not change. This information is not intended to replace advice given to you by your health care provider. Make sure you discuss any questions you have with your health care provider. Document Released: 08/14/2002 Document Revised: 04/22/2016 Document Reviewed: 02/28/2016 Elsevier Interactive Patient Education  2017 [employer].  Electrical Cardioversion, Care After This sheet gives you information about how to care for yourself after your procedure. Your health care provider may also give you more specific instructions. If you have problems or questions, contact your health care provider. What can I expect after the procedure? After the procedure, it is common to have:  Some redness on the skin where the shocks were given. Follow these instructions at home:  Do not drive for 24 hours if you were given a medicine to help you relax (sedative).  Take over-the-counter and prescription medicines only as told by your health care provider.  Ask your health care provider how to check your pulse. Check it often.  Rest for 48 hours after the procedure or as told by your health care provider.  Avoid or limit your caffeine use as told by your health care provider. Contact a health care provider if:  You feel like your heart is beating too quickly or your pulse is not regular.  You have a serious muscle cramp that does not go away. Get help right away if:  You have discomfort in your chest.  You are dizzy or you feel faint.  You have trouble breathing or you are short of breath.  Your speech is slurred.  You have trouble  moving an arm or leg on one side of your body.  Your fingers or toes turn cold or blue. This information is not intended to replace advice given to you by your health care provider. Make sure you discuss any questions you have with your health care provider. Document Released: 06/14/2013 Document Revised: 03/27/2016 Document Reviewed: 02/28/2016 Elsevier Interactive Patient Education  2017 [employer].

## 2016-08-17 ENCOUNTER — Ambulatory Visit: Payer: Medicare Other | Admitting: Certified Registered"

## 2016-08-17 ENCOUNTER — Encounter: Payer: Self-pay | Admitting: *Deleted

## 2016-08-17 ENCOUNTER — Ambulatory Visit
Admission: RE | Admit: 2016-08-17 | Discharge: 2016-08-17 | Disposition: A | Discharge status: Home / Self Care | Payer: Medicare Other | Source: Ambulatory Visit | Attending: Cardiovascular Disease | Admitting: Cardiovascular Disease

## 2016-08-17 ENCOUNTER — Encounter
Admission: RE | Disposition: A | Discharge status: Home / Self Care | Payer: Self-pay | Source: Ambulatory Visit | Attending: Cardiovascular Disease

## 2016-08-17 DIAGNOSIS — I10 Essential (primary) hypertension: Secondary | ICD-10-CM | POA: Insufficient documentation

## 2016-08-17 DIAGNOSIS — K219 Gastro-esophageal reflux disease without esophagitis: Secondary | ICD-10-CM | POA: Insufficient documentation

## 2016-08-17 DIAGNOSIS — H9192 Unspecified hearing loss, left ear: Secondary | ICD-10-CM | POA: Diagnosis not present

## 2016-08-17 DIAGNOSIS — Z87891 Personal history of nicotine dependence: Secondary | ICD-10-CM | POA: Insufficient documentation

## 2016-08-17 DIAGNOSIS — Z952 Presence of prosthetic heart valve: Secondary | ICD-10-CM | POA: Insufficient documentation

## 2016-08-17 DIAGNOSIS — Z9981 Dependence on supplemental oxygen: Secondary | ICD-10-CM | POA: Insufficient documentation

## 2016-08-17 DIAGNOSIS — I481 Persistent atrial fibrillation: Secondary | ICD-10-CM | POA: Insufficient documentation

## 2016-08-17 DIAGNOSIS — Z79899 Other long term (current) drug therapy: Secondary | ICD-10-CM | POA: Insufficient documentation

## 2016-08-17 DIAGNOSIS — Z6836 Body mass index (BMI) 36.0-36.9, adult: Secondary | ICD-10-CM | POA: Insufficient documentation

## 2016-08-17 DIAGNOSIS — Z7901 Long term (current) use of anticoagulants: Secondary | ICD-10-CM | POA: Diagnosis not present

## 2016-08-17 DIAGNOSIS — E669 Obesity, unspecified: Secondary | ICD-10-CM | POA: Diagnosis not present

## 2016-08-17 DIAGNOSIS — G4730 Sleep apnea, unspecified: Secondary | ICD-10-CM | POA: Insufficient documentation

## 2016-08-17 DIAGNOSIS — E785 Hyperlipidemia, unspecified: Secondary | ICD-10-CM | POA: Insufficient documentation

## 2016-08-17 DIAGNOSIS — J449 Chronic obstructive pulmonary disease, unspecified: Secondary | ICD-10-CM | POA: Insufficient documentation

## 2016-08-17 DIAGNOSIS — I4892 Unspecified atrial flutter: Secondary | ICD-10-CM | POA: Insufficient documentation

## 2016-08-17 DIAGNOSIS — I483 Typical atrial flutter: Secondary | ICD-10-CM | POA: Diagnosis not present

## 2016-08-17 HISTORY — PX: ELECTROPHYSIOLOGIC STUDY: SHX172A

## 2016-08-17 SURGERY — CARDIOVERSION (CATH LAB)
Anesthesia: General

## 2016-08-17 MED ORDER — SODIUM CHLORIDE 0.9 % IV SOLN
INTRAVENOUS | Status: DC
  Administered 2016-08-17: 07:00:00 via INTRAVENOUS

## 2016-08-17 MED ORDER — PROPOFOL 10 MG/ML IV BOLUS
INTRAVENOUS | Status: DC | PRN
  Administered 2016-08-17: 70 mg via INTRAVENOUS

## 2016-08-17 NOTE — Discharge Instructions (Signed)
Electrical Cardioversion, Care After °This sheet gives you information about how to care for yourself after your procedure. Your health care provider may also give you more specific instructions. If you have problems or questions, contact your health care provider. °What can I expect after the procedure? °After the procedure, it is common to have: °· Some redness on the skin where the shocks were given. °Follow these instructions at home: °· Do not drive for 24 hours if you were given a medicine to help you relax (sedative). °· Take over-the-counter and prescription medicines only as told by your health care provider. °· Ask your health care provider how to check your pulse. Check it often. °· Rest for 48 hours after the procedure or as told by your health care provider. °· Avoid or limit your caffeine use as told by your health care provider. °Contact a health care provider if: °· You feel like your heart is beating too quickly or your pulse is not regular. °· You have a serious muscle cramp that does not go away. °Get help right away if: °· You have discomfort in your chest. °· You are dizzy or you feel faint. °· You have trouble breathing or you are short of breath. °· Your speech is slurred. °· You have trouble moving an arm or leg on one side of your body. °· Your fingers or toes turn cold or blue. °This information is not intended to replace advice given to you by your health care provider. Make sure you discuss any questions you have with your health care provider. °Document Released: 06/14/2013 Document Revised: 03/27/2016 Document Reviewed: 02/28/2016 °Elsevier Interactive Patient Education © 2017 [employer] Inc. ° °

## 2016-08-17 NOTE — H&P (View-Only) (Signed)
Cardiology Office Note   Date:  08/13/2016   ID:  [name], DOB 12-01-1945, MRN [id number]  PCP:  [name], MD  Cardiologist:   [name], MD   Chief Complaint  Patient presents with  . other     F/u flutters/palpitations. Pt c/o dizziness, falling out of bed as well. Reviewed meds with pt verbally.      History of Present Illness: [name] is a 70 y.o. female who presents for A follow-up visit regarding persistent atrial fibrillation and valvular heart disease. 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  Cardiac catheterization before valve replacement showed no obstructive coronary artery disease.  Most recent echocardiogram in May 2016  showed normal LV systolic function with normal functioning of bioprosthetic aortic valve with mildly increased gradient.  Nuclear stress test in February 2017 was normal.  Her atrial fibrillation was controlled with small dose flecainide up until recently when she started having intermittent palpitations and tachycardia associated with worsening shortness of breath. She is usually very symptomatic when her heart rate is increased. We increased the dose of flecainide 100 mg twice daily but her symptoms persisted. She denies any chest discomfort.   Past Medical History:  Diagnosis Date  . 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 s/p bioprosthetic valve replacement 2008 at [facility]  . Basal skull fracture ([name]) 20 yrs ago  . Chronic respiratory failure ([place])   . COPD (chronic obstructive pulmonary disease) ([place])    a. on home O2 at 2L since 2008  . 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 HLD (hyperlipidemia)   . HLD (hyperlipidemia)   . HTN  (hypertension)   . Obesity   . PAF (paroxysmal atrial fibrillation) ([name])    a. on Eliquis; b. CHADS2VASc = 3 (HTN, age x 24, female)  . S/P ascending aortic aneurysm repair 2008  . Thyroid disease     Past Surgical History:  Procedure Laterality Date  . ABDOMINAL AORTIC ANEURYSM REPAIR  2008  . ABDOMINAL HYSTERECTOMY    . AORTIC VALVE REPLACEMENT  2008  . CARDIAC CATHETERIZATION     [facility]  . CARPAL TUNNEL RELEASE    . TUMOR EXCISION Left    x3 (arm)     Current Outpatient Prescriptions  Medication Sig Dispense Refill  . albuterol-ipratropium (COMBIVENT) 18-103 MCG/ACT inhaler Inhale 1 puff into the lungs 3 (three) times daily as needed for wheezing or shortness of breath.    . ALPRAZolam (XANAX) 0.25 MG tablet Take 0.5 mg by mouth at bedtime as needed for anxiety.     [name] apixaban (ELIQUIS) 5 MG TABS tablet Take 1 tablet (5 mg total) by mouth 2 (two) times daily. 60 tablet 3  . atorvastatin (LIPITOR) 40 MG tablet Take 40 mg by mouth daily.    . beclomethasone (QVAR) 80 MCG/ACT inhaler Inhale 1 puff into the lungs 2 (two) times daily.    [name] CALCIUM-VITAMIN D PO Take by mouth daily.    [name] diltiazem (CARDIZEM CD) 180 MG 24 hr capsule Take 1 capsule (180 mg total) by mouth daily. 30 capsule 3  . DULoxetine (CYMBALTA) 60 MG capsule Take 60 mg by  mouth daily.    Noelle Penner FIBER SUPPLEMENT PO Take 1 tablet by mouth daily.    . flecainide (TAMBOCOR) 100 MG tablet Take 1 tablet (100 mg total) by mouth 2 (two) times daily. 60 tablet 3  . IRON, FERROUS GLUCONATE, PO Take 2 tablets by mouth daily.    [name] levothyroxine (SYNTHROID, LEVOTHROID) 25 MCG tablet Take 25 mcg by mouth daily before breakfast.    . lisinopril-hydrochlorothiazide (PRINZIDE,ZESTORETIC) 20-25 MG tablet TAKE ONE TABLET BY MOUTH EVERY DAY 30 tablet 3  . loratadine (CLARITIN) 10 MG tablet Take 10 mg by mouth daily.    . Multiple Vitamins-Minerals (MULTIVITAMIN ADULTS 50+) TABS Take 1 tablet by mouth daily.    . Omega-3 Fatty Acids  (FISH OIL) 1200 MG CAPS Take 1,200 mg by mouth daily.    . potassium chloride (K-DUR,KLOR-CON) 10 MEQ tablet Take 10 mEq by mouth 3 (three) times daily.     . propranolol (INDERAL) 10 MG tablet Take one tablet every 30 minutes up to 4tablets maximum as needed for paroxysmal tachycardia 30 tablet 0   No current facility-administered medications for this visit.     Allergies:   Levaquin [levofloxacin in d5w]; Sulfa antibiotics; Benadryl [diphenhydramine hcl (sleep)]; Lasix [furosemide]; and Meloxicam    Social History:  The patient  reports that she has quit smoking. She has never used smokeless tobacco. She reports that she does not drink alcohol or use drugs.   Family History:  The patient's family history includes Stroke in her father and paternal grandmother.    ROS:  Please see the history of present illness.   Otherwise, review of systems are positive for none.   All other systems are reviewed and negative.    PHYSICAL EXAM: VS:  BP (!) 143/81 (BP Location: Left Arm, Patient Position: Sitting, Cuff Size: Normal)   Pulse 88   Ht 5' (1.524 m)   Wt 187 lb 8 oz (85 kg)   BMI 36.62 kg/m  , BMI Body mass index is 36.62 kg/m. GEN: Well nourished, well developed, in no acute distress  HEENT: normal  Neck: no JVD, carotid bruits, or masses Cardiac: RRR; no  rubs, or gallops,no edema .  is a 2/6 systolic ejection murmur in the aortic area.  Respiratory:  clear to auscultation bilaterally, normal work of breathing GI: soft, nontender, nondistended, + BS MS: no deformity or atrophy  Skin: warm and dry, no rash Neuro:  Howard and sensation are intact Psych: euthymic mood, full affect   EKG:  EKG is ordered today. Atrial flutter with variable AV block, LVH. Ventricular rate is 89 bpm.  Recent Labs: 10/26/2015: ALT 19; B Natriuretic Peptide 35.0; BUN 11; Creatinine, Ser 0.54; Hemoglobin 12.5; Platelets 298; Potassium 3.3; Sodium 131    Lipid Panel No results found for: CHOL, TRIG,  HDL, CHOLHDL, VLDL, LDLCALC, LDLDIRECT    Wt Readings from Last 3 Encounters:  08/13/16 187 lb 8 oz (85 kg)  02/28/16 186 lb (84.4 kg)  11/28/15 185 lb 4 oz (84 kg)         ASSESSMENT AND PLAN:  1.  Atrial flutter: The patient seems to be very symptomatic with tachycardia. Her previous diagnosis was atrial fibrillation and I could not find the EKGs from May 2016 to review. I'm wondering if she had atrial flutter at that time or if she developed atrial flutter while she is on flecainide. She has been anticoagulated with Eliquis and she has not missed any doses. Given that she is  highly symptomatic, I recommend proceeding with cardioversion. The patient has COPD on home oxygen and I don't think she would be a good candidate for ablation for that reason.  2. Essential hypertension: Blood pressure is well controlled on current medications.  3. History of aortic valve replacement with bioprosthetic valve: This was functioning normally by echo in May 2016.   Disposition:   FU with me after cardioversion  Signed,  [name], MD  08/13/2016 11:08 AM    [place]

## 2016-08-17 NOTE — Interval H&P Note (Signed)
History and Physical Interval Note:  08/17/2016 7:50 AM  [name]  has presented today for surgery, with the diagnosis of Cardioversion  Aflutter  The various methods of treatment have been discussed with the patient and family. After consideration of risks, benefits and other options for treatment, the patient has consented to  Procedure(s): Cardioversion (N/A) as a surgical intervention .  The patient's history has been reviewed, patient examined, no change in status, stable for surgery.  I have reviewed the patient's chart and labs.  Questions were answered to the patient's satisfaction.     [name]

## 2016-08-17 NOTE — CV Procedure (Signed)
Cardioversion note: A standard informed consent was obtained. Timeout was performed. The pads were placed in the anterior posterior fashion. The patient was given propofol by the anesthesia team.  Successful cardioversion was performed with a 100 J. The patient converted to sinus rhythm. Pre-and post EKGs were reviewed. The patient tolerated the procedure with no immediate complications.  Recommendations: Continue same medications and follow-up in 1-2 weeks.

## 2016-08-17 NOTE — Anesthesia Postprocedure Evaluation (Signed)
Anesthesia Post Note  Patient: [name]  Procedure(s) Performed: Procedure(s) (LRB): Cardioversion (N/A)  Patient location during evaluation: Cath Lab Anesthesia Type: General Level of consciousness: awake and alert Pain management: pain level controlled Vital Signs Assessment: post-procedure vital signs reviewed and stable Respiratory status: spontaneous breathing, nonlabored ventilation, respiratory function stable and patient connected to nasal cannula oxygen Cardiovascular status: blood pressure returned to baseline and stable Postop Assessment: no signs of nausea or vomiting Anesthetic complications: no    Last Vitals:  Vitals:   08/17/16 0830 08/17/16 0831  BP:  111/69  Pulse: 67   Resp: (!) 21 17  Temp:      Last Pain: There were no vitals filed for this visit.               Precious Haws Piscitello

## 2016-08-17 NOTE — Anesthesia Preprocedure Evaluation (Signed)
Anesthesia Evaluation  Patient identified by MRN, date of birth, ID band Patient awake    Reviewed: Allergy & Precautions, H&P , NPO status , Patient's Chart, lab work & pertinent test results  History of Anesthesia Complications Negative for: history of anesthetic complications  Airway Mallampati: III  TM Distance: <3 FB Neck ROM: limited    Dental  (+) Poor Dentition, Chipped, Missing   Pulmonary neg shortness of breath, COPD,  oxygen dependent, former smoker,    Pulmonary exam normal breath sounds clear to auscultation       Cardiovascular Exercise Tolerance: Poor hypertension, (-) angina+ DOE  (-) Past MI Normal cardiovascular exam+ dysrhythmias Atrial Fibrillation  Rhythm:regular Rate:Normal     Neuro/Psych negative neurological ROS  negative psych ROS   GI/Hepatic Neg liver ROS, GERD  Medicated and Controlled,  Endo/Other  negative endocrine ROS  Renal/GU negative Renal ROS  negative genitourinary   Musculoskeletal   Abdominal   Peds  Hematology negative hematology ROS (+)   Anesthesia Other Findings Past Medical History: No date: Aortic stenosis due to bicuspid aortic valve     Comment: a. s/p bioprosthetic valve replacement 2008 at              [facility] 20 yrs ago: Basal skull fracture ([place]) No date: Chronic respiratory failure ([name]) No date: COPD (chronic obstructive pulmonary disease) (*     Comment: a. 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 on Eliquis; b. CHADS2VASc = 3 (HTN, age x               2, female) 2008: S/P ascending aortic aneurysm repair No date: Thyroid disease  Past Surgical History: 2008: ABDOMINAL AORTIC ANEURYSM REPAIR No date: ABDOMINAL  HYSTERECTOMY 2008: AORTIC VALVE REPLACEMENT No date: CARDIAC CATHETERIZATION     Comment: [facility] No date: CARPAL TUNNEL RELEASE No date: TUMOR EXCISION Left     Comment: x3 (arm)     Reproductive/Obstetrics negative OB ROS                             Anesthesia Physical Anesthesia Plan  ASA: IV  Anesthesia Plan: General   Post-op Pain Management:    Induction:   Airway Management Planned:   Additional Equipment:   Intra-op Plan:   Post-operative Plan:   Informed Consent: I have reviewed the patients History and Physical, chart, labs and discussed the procedure including the risks, benefits and alternatives for the proposed anesthesia with the patient or authorized representative who has indicated his/her understanding and acceptance.   Dental Advisory Given  Plan Discussed with: Anesthesiologist, CRNA and Surgeon  Anesthesia Plan Comments:         Anesthesia Quick Evaluation

## 2016-08-17 NOTE — Transfer of Care (Signed)
Immediate Anesthesia Transfer of Care Note  Patient: [name]  Procedure(s) Performed: Procedure(s): Cardioversion (N/A)  Patient Location: spu  Anesthesia Type:General  Level of Consciousness: awake  Airway & Oxygen Therapy: Patient Spontanous Breathing and Patient connected to nasal cannula oxygen  Post-op Assessment: Report given to RN and Post -op Vital signs reviewed and stable  Post vital signs: Reviewed  Last Vitals:  Vitals:   08/17/16 0748 08/17/16 0749  BP:  105/70  Pulse: 63 63  Resp: 20 (!) 21  Temp:      Last Pain: There were no vitals filed for this visit.       Complications: No apparent anesthesia complications

## 2016-08-19 ENCOUNTER — Telehealth: Payer: Self-pay | Admitting: Cardiovascular Disease

## 2016-08-19 NOTE — Telephone Encounter (Signed)
Pt c/o medication issue:  1. Name of Medication: Flecainide 100 mg 2 times a day   2. How are you currently taking this medication (dosage and times per day)?  Takes one in morning and one at night  3. Are you having a reaction (difficulty breathing--STAT)? no  4. What is your medication issue? States we "shocked" her heart Monday and we upped her dose. It is making her very dizzy can't even drive she states Please advise

## 2016-08-19 NOTE — Telephone Encounter (Signed)
Patient states that she is dizzy all day long and is unable to do anything due to the extreme dizziness. She states that she is just not able to function and would like to know what we can do about this. Let her know that I would forward to Dr. Fletcher Anon for his recommendations and then we would be in touch with her. She was appreciative for the call and had no further questions at this time.

## 2016-08-20 MED ORDER — FLECAINIDE ACETATE 50 MG PO TABS: 50.0000 mg | ORAL_TABLET | Freq: Two times a day (BID) | ORAL | 6 refills | Status: DC

## 2016-08-20 NOTE — Telephone Encounter (Signed)
Decrease Flecainide to 50 mg bid.

## 2016-08-20 NOTE — Telephone Encounter (Signed)
Spoke with patient and let her know that Dr. Fletcher Anon recommended that she decrease the flecainide to 50 mg twice a day. She verbalized understanding and had no further questions at this time.

## 2016-08-20 NOTE — Telephone Encounter (Signed)
Left voicemail message for patient to call back.

## 2016-08-21 ENCOUNTER — Other Ambulatory Visit: Payer: Self-pay | Admitting: *Deleted

## 2016-08-21 MED ORDER — LISINOPRIL-HYDROCHLOROTHIAZIDE 20-25 MG PO TABS: 1.0000 | ORAL_TABLET | Freq: Every day | ORAL | 3 refills | Status: DC

## 2016-08-24 DIAGNOSIS — Z9981 Dependence on supplemental oxygen: Secondary | ICD-10-CM | POA: Diagnosis not present

## 2016-08-24 DIAGNOSIS — J449 Chronic obstructive pulmonary disease, unspecified: Secondary | ICD-10-CM | POA: Diagnosis not present

## 2016-08-24 DIAGNOSIS — F331 Major depressive disorder, recurrent, moderate: Secondary | ICD-10-CM | POA: Diagnosis not present

## 2016-08-24 DIAGNOSIS — I10 Essential (primary) hypertension: Secondary | ICD-10-CM | POA: Diagnosis not present

## 2016-08-24 DIAGNOSIS — I4891 Unspecified atrial fibrillation: Secondary | ICD-10-CM | POA: Diagnosis not present

## 2016-08-28 ENCOUNTER — Encounter: Payer: Self-pay | Admitting: Cardiovascular Disease

## 2016-08-28 ENCOUNTER — Ambulatory Visit (INDEPENDENT_AMBULATORY_CARE_PROVIDER_SITE_OTHER): Payer: Medicare Other | Admitting: Cardiovascular Disease

## 2016-08-28 VITALS — BP 145/80 | HR 67 | Ht 60.0 in | Wt 186.0 lb

## 2016-08-28 DIAGNOSIS — Z953 Presence of xenogenic heart valve: Secondary | ICD-10-CM | POA: Diagnosis not present

## 2016-08-28 DIAGNOSIS — I4892 Unspecified atrial flutter: Secondary | ICD-10-CM

## 2016-08-28 DIAGNOSIS — I10 Essential (primary) hypertension: Secondary | ICD-10-CM

## 2016-08-28 NOTE — Progress Notes (Signed)
Cardiology Office Note   Date:  08/28/2016   ID:  [name], DOB 07/30/1946, MRN [id number]  PCP:  [name], MD  Cardiologist:   [name], MD   Chief Complaint  Patient presents with  . other    2wk f/u post electrical cardioversion. Pt c/o sob.  Reviewed meds with pt verbally.      History of Present Illness: [name] is a 71 y.o. female who presents for A follow-up visit regarding persistent atrial fibrillation/flutter and valvular heart disease. 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  Cardiac catheterization before valve replacement showed no obstructive coronary artery disease.  Most recent echocardiogram in May 2016  showed normal LV systolic function with normal functioning of bioprosthetic aortic valve with mildly increased gradient.  Nuclear stress test in February 2017 was normal.  She recently developed symptomatic atrial flutter. I increased the dose of flecainide 100 mg twice daily and proceeded with successful cardioversion. She did not tolerate this dose of flecainide due to his dizziness and I decreased it back to 50 mg twice daily. She has been doing well since then with no chest pain, palpitations or dizziness.   Past Medical History:  Diagnosis Date  . 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 s/p bioprosthetic valve replacement 2008 at [facility]  . Basal skull fracture ([name]) 20 yrs ago  . Chronic respiratory failure ([place])   . COPD (chronic obstructive pulmonary disease) ([place])    a. on home O2 at 2L since 2008  . 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 HLD (hyperlipidemia)   . HLD (hyperlipidemia)   . HTN (hypertension)   . Obesity   . PAF (paroxysmal atrial  fibrillation) ([name])    a. on Eliquis; b. CHADS2VASc = 3 (HTN, age x 27, female)  . S/P ascending aortic aneurysm repair 2008  . Thyroid disease     Past Surgical History:  Procedure Laterality Date  . ABDOMINAL AORTIC ANEURYSM REPAIR  2008  . ABDOMINAL HYSTERECTOMY    . AORTIC VALVE REPLACEMENT  2008  . CARDIAC CATHETERIZATION     [facility]  . CARPAL TUNNEL RELEASE    . TUMOR EXCISION Left    x3 (arm)     Current Outpatient Prescriptions  Medication Sig Dispense Refill  . acetaminophen (TYLENOL) 500 MG tablet Take 500 mg by mouth 2 (two) times daily as needed for moderate pain or headache.    . albuterol-ipratropium (COMBIVENT) 18-103 MCG/ACT inhaler Inhale 1 puff into the lungs 3 (three) times daily as needed for wheezing or shortness of breath.    . ALPRAZolam (XANAX) 0.25 MG tablet Take 0.5 mg by mouth at bedtime as needed for anxiety.     [name] apixaban (ELIQUIS) 5 MG TABS tablet Take 1 tablet (5 mg total) by mouth 2 (two) times daily. 60 tablet 3  . atorvastatin (LIPITOR) 40 MG tablet Take 40 mg by mouth at bedtime.     . Carboxymethylcellul-Glycerin (LUBRICATING EYE DROPS OP) Apply 1 drop to eye daily as needed (dry eyes).    . Coenzyme Q10 (COQ10) 200 MG CAPS Take 200 mg by mouth daily.    [name] diltiazem (  CARDIZEM CD) 180 MG 24 hr capsule Take 1 capsule (180 mg total) by mouth daily. 30 capsule 3  . DULoxetine (CYMBALTA) 60 MG capsule Take 60 mg by mouth daily.    . ferrous sulfate (FERROUSUL) 325 (65 FE) MG tablet Take 325 mg by mouth daily with breakfast.    . flecainide (TAMBOCOR) 50 MG tablet Take 1 tablet (50 mg total) by mouth 2 (two) times daily. 60 tablet 6  . Ketotifen Fumarate (ALAWAY OP) Apply 1 drop to eye daily as needed (allergies).    [name] levothyroxine (SYNTHROID, LEVOTHROID) 25 MCG tablet Take 25 mcg by mouth daily before breakfast.    . lisinopril-hydrochlorothiazide (PRINZIDE,ZESTORETIC) 20-25 MG tablet Take 1 tablet by mouth daily. 30 tablet 3  . loratadine (CLARITIN) 10  MG tablet Take 10 mg by mouth daily.    . mometasone (ASMANEX) 220 MCG/INH inhaler Inhale 1 puff into the lungs 2 (two) times daily.    . potassium chloride (K-DUR,KLOR-CON) 10 MEQ tablet Take 10 mEq by mouth 3 (three) times daily.     . sodium chloride (OCEAN) 0.65 % SOLN nasal spray Place 1 spray into both nostrils daily as needed for congestion.     No current facility-administered medications for this visit.     Allergies:   Levaquin [levofloxacin in d5w]; Soy allergy; Benadryl [diphenhydramine hcl (sleep)]; Lasix [furosemide]; Meloxicam; and Sulfa antibiotics    Social History:  The patient  reports that she has quit smoking. She has never used smokeless tobacco. She reports that she does not drink alcohol or use drugs.   Family History:  The patient's family history includes Stroke in her father and paternal grandmother.    ROS:  Please see the history of present illness.   Otherwise, review of systems are positive for none.   All other systems are reviewed and negative.    PHYSICAL EXAM: VS:  BP (!) 145/80 (BP Location: Left Arm, Patient Position: Sitting, Cuff Size: Normal)   Pulse 67   Ht 5' (1.524 m)   Wt 186 lb (84.4 kg)   BMI 36.33 kg/m  , BMI Body mass index is 36.33 kg/m. GEN: Well nourished, well developed, in no acute distress  HEENT: normal  Neck: no JVD, carotid bruits, or masses Cardiac: RRR; no  rubs, or gallops,no edema .  is a 2/6 systolic ejection murmur in the aortic area.  Respiratory:  clear to auscultation bilaterally, normal work of breathing GI: soft, nontender, nondistended, + BS MS: no deformity or atrophy  Skin: warm and dry, no rash Neuro:  Strength and sensation are intact Psych: euthymic mood, full affect   EKG:  EKG is ordered today. Normal sinus rhythm. LVH with repolarization abnormalities.  Recent Labs: 10/26/2015: ALT 19; B Natriuretic Peptide 35.0 08/13/2016: BUN 12; Creatinine, Ser 0.76; Hemoglobin 12.5; Platelets 275; Potassium 3.5;  Sodium 134    Lipid Panel No results found for: CHOL, TRIG, HDL, CHOLHDL, VLDL, LDLCALC, LDLDIRECT    Wt Readings from Last 3 Encounters:  08/28/16 186 lb (84.4 kg)  08/17/16 187 lb (84.8 kg)  08/13/16 187 lb 8 oz (85 kg)         ASSESSMENT AND PLAN:  1.  Persistent Atrial flutter: Status post successful cardioversion to normal sinus rhythm. Continue treatment with flecainide and diltiazem. If she develops recurrent atrial flutter, we can consider EP evaluation for ablation although that somewhat limited by chronic respiratory failure requiring oxygen.  Continue long-term anticoagulation with Eliquis  2. Essential hypertension: Blood pressure  is well controlled on current medications.  3. History of aortic valve replacement with bioprosthetic valve: This was functioning normally by echo in May 2016.   Disposition:   FU with me in 6 months  Signed,  [name], MD  08/28/2016 10:57 AM    [place]

## 2016-08-28 NOTE — Patient Instructions (Signed)
Medication Instructions: Continue same medications.   Labwork: None.   Procedures/Testing: None.   Follow-Up: 6 months with Dr. Beonca Gibb.   Any Additional Special Instructions Will Be Listed Below (If Applicable).     If you need a refill on your cardiac medications before your next appointment, please call your pharmacy.   

## 2016-09-07 DIAGNOSIS — G4733 Obstructive sleep apnea (adult) (pediatric): Secondary | ICD-10-CM | POA: Diagnosis not present

## 2016-09-11 ENCOUNTER — Encounter: Payer: Self-pay | Admitting: Cardiovascular Disease

## 2016-09-14 DIAGNOSIS — R42 Dizziness and giddiness: Secondary | ICD-10-CM | POA: Diagnosis not present

## 2016-09-14 DIAGNOSIS — K120 Recurrent oral aphthae: Secondary | ICD-10-CM | POA: Diagnosis not present

## 2016-09-21 DIAGNOSIS — H2513 Age-related nuclear cataract, bilateral: Secondary | ICD-10-CM | POA: Diagnosis not present

## 2016-10-13 DIAGNOSIS — G4733 Obstructive sleep apnea (adult) (pediatric): Secondary | ICD-10-CM | POA: Diagnosis not present

## 2016-10-13 DIAGNOSIS — J9611 Chronic respiratory failure with hypoxia: Secondary | ICD-10-CM | POA: Diagnosis not present

## 2016-10-13 DIAGNOSIS — F331 Major depressive disorder, recurrent, moderate: Secondary | ICD-10-CM | POA: Diagnosis not present

## 2016-10-27 DIAGNOSIS — I480 Paroxysmal atrial fibrillation: Secondary | ICD-10-CM | POA: Diagnosis not present

## 2016-10-27 DIAGNOSIS — R42 Dizziness and giddiness: Secondary | ICD-10-CM | POA: Diagnosis not present

## 2016-10-27 DIAGNOSIS — G4733 Obstructive sleep apnea (adult) (pediatric): Secondary | ICD-10-CM | POA: Diagnosis not present

## 2016-10-27 DIAGNOSIS — R9082 White matter disease, unspecified: Secondary | ICD-10-CM | POA: Diagnosis not present

## 2016-11-02 ENCOUNTER — Other Ambulatory Visit: Payer: Self-pay | Admitting: Neurology

## 2016-11-02 DIAGNOSIS — R42 Dizziness and giddiness: Secondary | ICD-10-CM

## 2016-11-04 DIAGNOSIS — G4733 Obstructive sleep apnea (adult) (pediatric): Secondary | ICD-10-CM | POA: Diagnosis not present

## 2016-11-11 ENCOUNTER — Other Ambulatory Visit: Payer: Self-pay | Admitting: Cardiovascular Disease

## 2016-11-11 DIAGNOSIS — I10 Essential (primary) hypertension: Secondary | ICD-10-CM

## 2016-11-11 DIAGNOSIS — G4733 Obstructive sleep apnea (adult) (pediatric): Secondary | ICD-10-CM | POA: Diagnosis not present

## 2016-11-11 DIAGNOSIS — I480 Paroxysmal atrial fibrillation: Secondary | ICD-10-CM

## 2016-11-18 ENCOUNTER — Ambulatory Visit
Admission: RE | Admit: 2016-11-18 | Discharge: 2016-11-18 | Disposition: A | Discharge status: Home / Self Care | Payer: Medicare Other | Source: Ambulatory Visit | Attending: Neurology | Admitting: Neurology

## 2016-11-18 DIAGNOSIS — R9082 White matter disease, unspecified: Secondary | ICD-10-CM | POA: Insufficient documentation

## 2016-11-18 DIAGNOSIS — Z8673 Personal history of transient ischemic attack (TIA), and cerebral infarction without residual deficits: Secondary | ICD-10-CM | POA: Diagnosis not present

## 2016-11-18 DIAGNOSIS — R42 Dizziness and giddiness: Secondary | ICD-10-CM

## 2016-11-18 DIAGNOSIS — S0990XA Unspecified injury of head, initial encounter: Secondary | ICD-10-CM | POA: Diagnosis not present

## 2016-11-18 LAB — POCT I-STAT CREATININE: Creatinine, Ser: 0.6 mg/dL (ref 0.44–1.00)

## 2016-11-18 MED ORDER — GADOBENATE DIMEGLUMINE 529 MG/ML IV SOLN
17.0000 mL | Freq: Once | INTRAVENOUS | Status: AC | PRN
  Administered 2016-11-18: 17 mL via INTRAVENOUS

## 2016-12-03 DIAGNOSIS — R9082 White matter disease, unspecified: Secondary | ICD-10-CM | POA: Diagnosis not present

## 2016-12-03 DIAGNOSIS — G4733 Obstructive sleep apnea (adult) (pediatric): Secondary | ICD-10-CM | POA: Diagnosis not present

## 2016-12-03 DIAGNOSIS — R42 Dizziness and giddiness: Secondary | ICD-10-CM | POA: Diagnosis not present

## 2016-12-03 DIAGNOSIS — I480 Paroxysmal atrial fibrillation: Secondary | ICD-10-CM | POA: Diagnosis not present

## 2016-12-07 ENCOUNTER — Other Ambulatory Visit: Payer: Self-pay | Admitting: Cardiovascular Disease

## 2016-12-18 ENCOUNTER — Other Ambulatory Visit: Payer: Self-pay | Admitting: Cardiovascular Disease

## 2016-12-31 DIAGNOSIS — J4540 Moderate persistent asthma, uncomplicated: Secondary | ICD-10-CM | POA: Diagnosis not present

## 2016-12-31 DIAGNOSIS — I10 Essential (primary) hypertension: Secondary | ICD-10-CM | POA: Diagnosis not present

## 2016-12-31 DIAGNOSIS — I4891 Unspecified atrial fibrillation: Secondary | ICD-10-CM | POA: Diagnosis not present

## 2016-12-31 DIAGNOSIS — F331 Major depressive disorder, recurrent, moderate: Secondary | ICD-10-CM | POA: Diagnosis not present

## 2017-01-18 ENCOUNTER — Other Ambulatory Visit: Payer: Self-pay | Admitting: Dermatopathology

## 2017-01-22 ENCOUNTER — Encounter: Payer: Self-pay | Admitting: Emergency Medicine

## 2017-01-22 ENCOUNTER — Emergency Department: Payer: Medicare Other

## 2017-01-22 ENCOUNTER — Emergency Department
Admission: EM | Admit: 2017-01-22 | Discharge: 2017-01-22 | Disposition: A | Discharge status: Home / Self Care | Payer: Medicare Other | Attending: Emergency Medicine | Admitting: Emergency Medicine

## 2017-01-22 ENCOUNTER — Telehealth: Payer: Self-pay | Admitting: Cardiovascular Disease

## 2017-01-22 DIAGNOSIS — J449 Chronic obstructive pulmonary disease, unspecified: Secondary | ICD-10-CM | POA: Diagnosis not present

## 2017-01-22 DIAGNOSIS — Z79899 Other long term (current) drug therapy: Secondary | ICD-10-CM | POA: Insufficient documentation

## 2017-01-22 DIAGNOSIS — Z87891 Personal history of nicotine dependence: Secondary | ICD-10-CM | POA: Insufficient documentation

## 2017-01-22 DIAGNOSIS — R001 Bradycardia, unspecified: Secondary | ICD-10-CM | POA: Diagnosis not present

## 2017-01-22 DIAGNOSIS — R0602 Shortness of breath: Secondary | ICD-10-CM | POA: Diagnosis not present

## 2017-01-22 DIAGNOSIS — R200 Anesthesia of skin: Secondary | ICD-10-CM | POA: Insufficient documentation

## 2017-01-22 DIAGNOSIS — I10 Essential (primary) hypertension: Secondary | ICD-10-CM | POA: Insufficient documentation

## 2017-01-22 LAB — CBC
HCT: 35 % (ref 35.0–47.0)
Hemoglobin: 11.9 g/dL — ABNORMAL LOW (ref 12.0–16.0)
MCH: 28.3 pg (ref 26.0–34.0)
MCHC: 34 g/dL (ref 32.0–36.0)
MCV: 83.3 fL (ref 80.0–100.0)
Platelets: 273 10*3/uL (ref 150–440)
RBC: 4.2 MIL/uL (ref 3.80–5.20)
RDW: 16.2 % — ABNORMAL HIGH (ref 11.5–14.5)
WBC: 8.6 10*3/uL (ref 3.6–11.0)

## 2017-01-22 LAB — BASIC METABOLIC PANEL
Anion gap: 8 (ref 5–15)
BUN: 14 mg/dL (ref 6–20)
CO2: 29 mmol/L (ref 22–32)
Calcium: 9.4 mg/dL (ref 8.9–10.3)
Chloride: 97 mmol/L — ABNORMAL LOW (ref 101–111)
Creatinine, Ser: 0.72 mg/dL (ref 0.44–1.00)
GFR calc Af Amer: >60 mL/min (ref >60)
GFR calc non Af Amer: >60 mL/min (ref >60)
Glucose, Bld: 109 mg/dL — ABNORMAL HIGH (ref 65–99)
Potassium: 3.6 mmol/L (ref 3.5–5.1)
Sodium: 134 mmol/L — ABNORMAL LOW (ref 135–145)

## 2017-01-22 LAB — TROPONIN I: Troponin I: <0.03 ng/mL (ref <0.03)

## 2017-01-22 NOTE — Telephone Encounter (Signed)
Pt is calling back, states she thinks her low HR is coming from Diclofenac, a gel she has put on for her arthritis.

## 2017-01-22 NOTE — Telephone Encounter (Signed)
Pt states that about 10 minutes ago her HR was in the 40s, she states now it is 50, she states she can "feel it in my heart, I feel as if all the blood is draining out of my body" Denies any CP. Some SOB. Oxygen is 98. BP earlier today was 134/79 HR 66.

## 2017-01-22 NOTE — ED Triage Notes (Signed)
Pt reports "funny feeling" has "come over me" intermittently, reports as numbness to left arm and a cool sensation. Pt reports HR was as low as 40 at home today.

## 2017-01-22 NOTE — Discharge Instructions (Signed)
Please follow-up with cardiology for further workup/evaluation and consideration of a Holter monitor. Return to the emergency department for any further episodes, or for any chest pain or trouble breathing.

## 2017-01-22 NOTE — ED Notes (Signed)
Pt updated on delay. Pt states "i don't understand why another doctor who isn't with a sick person can't check me out and send me home?" explanation of care plan again discussed with pt who does not appear to understand.

## 2017-01-22 NOTE — ED Notes (Signed)
Pt again updated on delay. Pt states "i don't care, another doctor can come and check me out". Pt informed she is going to be discharged per dr. Kerman Passey, however md still needs to speak with pt. Pt assisted to removing monitoring equipment and provided with clothing to anticipate discharge.

## 2017-01-22 NOTE — ED Provider Notes (Signed)
[facility] Emergency Department Provider Note  Time seen: 8:23 PM  I have reviewed the triage vital signs and the nursing notes.   HISTORY  Chief Complaint Bradycardia    HPI [name] is a 71 y.o. female with a past medical history of COPD on 2 L of oxygen 24/70, hypertension, hyperlipidemia, paroxysmal atrial fibrillation, presents to the emergency department for a low heart rate. According to the patient at home today she was having a feeling like the energy was draining out of her. States she checked her blood pressure which was normal but her pulse appeared to go down into the 40s several times before running back into the 50s and 60s. Patient called her cardiologist however they were not able to see her in the office today and they told her to monitor this at home and to happen again to go to the ER. Patient states she continued to monitor the heart rate at home and once again dipped as low as 40 beats per minutes as she came to the emergency department. Patient denies any chest pain or shortness of breath at any time. Denies any nausea or diaphoresis. Patient states her only symptom is this feeling like the energy is draining out of her which she describes as general fatigue. But states it is brief and denies any of these symptoms currently.  Past Medical History:  Diagnosis Date  . 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 s/p bioprosthetic valve replacement 2008 at [facility]  . Basal skull fracture ([name]) 20 yrs ago  . Chronic respiratory failure ([place])   . COPD (chronic obstructive pulmonary disease) ([place])    a. on home O2 at 2L since 2008  . 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 HLD (hyperlipidemia)   . HLD (hyperlipidemia)   . HTN (hypertension)   . Obesity   . PAF (paroxysmal atrial fibrillation) ([name])    a. on Eliquis; b. CHADS2VASc = 3 (HTN, age x 57, female)  . S/P ascending aortic aneurysm repair 2008  . Thyroid  disease     Patient Active Problem List   Diagnosis Date Noted  . Typical atrial flutter ([place])   . Essential hypertension 01/24/2015  . Paroxysmal atrial fibrillation ([place]) 01/24/2015  . History of aortic valve replacement with bioprosthetic valve 01/24/2015  . Atrial fibrillation with RVR ([place]) 01/11/2015    Past Surgical History:  Procedure Laterality Date  . ABDOMINAL AORTIC ANEURYSM REPAIR  2008  . ABDOMINAL HYSTERECTOMY    . AORTIC VALVE REPLACEMENT  2008  . CARDIAC CATHETERIZATION     [facility]  . CARPAL TUNNEL RELEASE    . ELECTROPHYSIOLOGIC STUDY N/A 08/17/2016   Procedure: Cardioversion;  Surgeon: [name], MD;  Location: [facility] ORS;  Service: Cardiovascular;  Laterality: N/A;  . TUMOR EXCISION Left    x3 (arm)    Prior to Admission medications   Medication Sig Start Date End Date Taking? Authorizing Provider  acetaminophen (TYLENOL) 500 MG tablet Take 500 mg by mouth 2 (two) times daily as needed for moderate pain or headache.   Yes [provider]  albuterol-ipratropium (COMBIVENT) 18-103 MCG/ACT inhaler Inhale 1 puff into the lungs 3 (three) times daily as needed for wheezing or shortness of breath.   Yes [provider]  ALPRAZolam (XANAX) 0.25 MG tablet Take 0.5 mg by mouth at bedtime as needed for anxiety.    Yes [provider]  apixaban (ELIQUIS) 5 MG TABS tablet Take 1 tablet (5 mg total) by mouth 2 (two) times daily. 08/10/16  Yes [name], MD  atorvastatin (LIPITOR) 40 MG tablet Take 40 mg by mouth at bedtime.    Yes [provider]  Carboxymethylcellul-Glycerin (LUBRICATING EYE DROPS OP) Apply 1 drop to eye daily as needed (dry eyes).   Yes [provider]  Coenzyme Q10 (COQ10) 200 MG CAPS Take 200 mg by mouth daily.   Yes [provider]  diltiazem (CARDIZEM CD) 180 MG 24 hr capsule TAKE ONE CAPSULE BY MOUTH EVERY DAY 11/11/16  Yes [name], MD  DULoxetine (CYMBALTA) 60 MG capsule Take 60  mg by mouth 2 (two) times daily.    Yes [provider]  ferrous sulfate (FERROUSUL) 325 (65 FE) MG tablet Take 325 mg by mouth daily with breakfast.   Yes [provider]  flecainide (TAMBOCOR) 50 MG tablet Take 1 tablet (50 mg total) by mouth 2 (two) times daily. 08/20/16  Yes [name], MD  Ketotifen Fumarate (ALAWAY OP) Apply 1 drop to eye daily as needed (allergies).   Yes [provider]  levothyroxine (SYNTHROID, LEVOTHROID) 25 MCG tablet Take 25 mcg by mouth daily before breakfast.   Yes [provider]  lisinopril-hydrochlorothiazide (PRINZIDE,ZESTORETIC) 20-25 MG tablet TAKE ONE TABLET BY MOUTH EVERY DAY 01/19/17  Yes [name], MD  loratadine (CLARITIN) 10 MG tablet Take 10 mg by mouth daily.   Yes [provider]  mometasone (ASMANEX) 220 MCG/INH inhaler Inhale 1 puff into the lungs 2 (two) times daily.   Yes [provider]  pantoprazole (PROTONIX) 40 MG tablet Take 40 mg by mouth daily. 12/28/16  Yes [provider]  potassium chloride (K-DUR,KLOR-CON) 10 MEQ tablet Take 10 mEq by mouth 3 (three) times daily.    Yes [provider]  sodium chloride (OCEAN) 0.65 % SOLN nasal spray Place 1 spray into both nostrils daily as needed for congestion.   Yes [provider]  TRAZODONE HCL PO Take 1 tablet by mouth at bedtime.   Yes [provider]  diltiazem (CARDIZEM CD) 180 MG 24 hr capsule Take 1 capsule (180 mg total) by mouth daily. Patient not taking: Reported on 01/22/2017 02/17/16   [name], MD  ELIQUIS 5 MG TABS tablet TAKE 1 TABLET (5 MG TOTAL) BY MOUTH 2 (TWO) TIMES DAILY. Patient not taking: Reported on 01/22/2017 12/07/16   [name], MD    Allergies  Allergen Reactions  . Levaquin [Levofloxacin In D5w] Other (See Comments)    Reaction:  Fatigue and muscle soreness  . Soy Allergy Nausea And Vomiting  . Benadryl [Diphenhydramine Hcl (Sleep)] Palpitations  .  Lasix [Furosemide] Rash  . Meloxicam Rash  . Sulfa Antibiotics Rash    Family History  Problem Relation Age of Onset  . Stroke Father   . Stroke Paternal Grandmother     Social History Social History  Substance Use Topics  . Smoking status: Former Research scientist (life sciences)  . Smokeless tobacco: Never Used  . Alcohol use No    Review of Systems Constitutional: Negative for fever. Cardiovascular: Negative for chest pain. Respiratory: Negative for shortness of breath. Gastrointestinal: Negative for abdominal pain Musculoskeletal: Negative for back pain Neurological: Negative for headaches, focal weakness or numbness. All other ROS negative  ____________________________________________   PHYSICAL EXAM:  VITAL SIGNS: ED Triage Vitals  Enc Vitals Group     BP 01/22/17 1650 136/63  Pulse Rate 01/22/17 1650 (!) 57     Resp 01/22/17 1650 16     Temp 01/22/17 1650 98.4 F (36.9 C)     Temp Source 01/22/17 1650 Oral     SpO2 01/22/17 1650 95 %     Weight 01/22/17 1651 185 lb (83.9 kg)     Height 01/22/17 1651 5' (1.524 m)     Head Circumference --      Peak Flow --      Pain Score 01/22/17 1650 0     Pain Loc --      Pain Edu? --      Excl. in [place]? --     Constitutional: Alert and oriented. Well appearing and in no distress. Eyes: Normal exam ENT   Head: Normocephalic and atraumatic   Mouth/Throat: Mucous membranes are moist. Cardiovascular: Normal rate, regular rhythm. No murmur Respiratory: Normal respiratory effort without tachypnea nor retractions. Breath sounds are clear Gastrointestinal: Soft and nontender. No distention. Musculoskeletal: Nontender with normal range of motion in all extremities. No lower extremity tenderness  Neurologic:  Normal speech and language. No gross focal neurologic deficits Skin:  Skin is warm, dry and intact.  Psychiatric: Mood and affect are normal.   ____________________________________________    EKG  EKG reviewed and interpreted  by myself shows sinus bradycardia at 57 bpm, narrow QRS, normal axis, normal intervals, no concerning ST changes.  ____________________________________________    RADIOLOGY  Chest x-ray negative  ____________________________________________   INITIAL IMPRESSION / ASSESSMENT AND PLAN / ED COURSE  Pertinent labs & imaging results that were available during my care of the patient were reviewed by me and considered in my medical decision making (see chart for details).  Patient presents to the emergency Department for reported bradycardia at home. Patient denies any chest pain or trouble breathing, lightheadedness or dizziness during these episodes. She states she felt a feeling like the energy was draining out of her arms. Patient states several times while monitoring her pulse via pulse oximetry at home they got down into the 40s at one point got as low as 40 bpm. Patient states since arriving to the emergency department she has not had any further issues or concerning feelings. Patient's heart rate has been normal throughout her stay in the emergency department around 60 beats a minute. Patient's labs are normal. Patient sees Dr.Arida, I discussed with patient following up with cardiologist, for follow-up for holter monitor.    ____________________________________________   FINAL CLINICAL IMPRESSION(S) / ED DIAGNOSES  bradycardia    Harvest Dark, MD 01/22/17 2349

## 2017-01-22 NOTE — Telephone Encounter (Signed)
Spoke with patient and she reports that her pulse is fluctuating and she just does not feel well. Blood pressure currently is 131/70 with heart rate of 55. She states that her oxygen saturation at 98% but she just feels as if the blood is draining out. Instructed her to continue monitoring her symptoms and if they persist or worsen to go to the closest emergency room. She verbalized understanding with no further questions at this time.

## 2017-01-22 NOTE — Telephone Encounter (Signed)
Spoke with patient and she wanted to know if the gel could cause these symptoms. Instructed her that I am not familiar with that gel medication. Instructed her to go to emergency room if she is still having symptoms and she verbalized understanding with no further questions.

## 2017-01-23 ENCOUNTER — Telehealth: Payer: Self-pay | Admitting: Physician Assistant

## 2017-01-23 ENCOUNTER — Telehealth: Payer: Self-pay | Admitting: Cardiology

## 2017-01-23 NOTE — Telephone Encounter (Signed)
Patient called to confirm which medication to stop for bradycardia. Spoke with son as well. Advised to stop Cardizem CD ( went over ). HR now improved to 70s. Feeling better. Advised to go ER if syncope or continuous dizziness. Otherwise, she will call next week to make appointment.

## 2017-01-23 NOTE — Telephone Encounter (Signed)
Received page from patient re: bradycardia.  Pt was evaluated at [facility] ED this evening for bradycardia and sent home.  Her ECG showed sinus bradycardia with rates in the 50s, with normal QRS duration and normal QTc.  While on the monitor there she was predominantly in the low 60s.  Labs including troponin were normal.  After returning home, she stated that she checked her pulse and it was in the low 40s.  She stated that "it felt like the blood was running out of her arms," but denied syncope, pre-syncope, chest pain or SOB.  I instructed her to hold her diltiazem dose for the next 2 days prior to contacting her outpatient providers on Monday morning.  I advised her to be evaluated in the ED if she had syncope, chest pain or significant SOB.  She expressed understanding and agreed with the plan.

## 2017-01-24 ENCOUNTER — Telehealth: Payer: Self-pay | Admitting: Cardiovascular Disease

## 2017-01-24 NOTE — Telephone Encounter (Signed)
[name] called to report that since holding her diltiazem in the setting of her recent symptomatic bradycardia her BP has been increasing and is associated with a mild headache. She denies CP, SOB, or visual disturbances.  Last BP=154/85. I instructed [name] to continue holding her diltiazem as previously instructed but to take an extra dose of her Lisinopril-HCTZ tonight.  She will also call the office first thing tomorrow morning to schedule a clinic visit in order to further optimize her medication regimen.  Clayborne Dana MD

## 2017-01-25 ENCOUNTER — Telehealth: Payer: Self-pay | Admitting: Cardiovascular Disease

## 2017-01-25 NOTE — Telephone Encounter (Signed)
Received incoming call from patient asking for an update. Advised that Dr Fletcher Anon is in clinic and that we will follow up with her soon. She verbalized understanding.

## 2017-01-25 NOTE — Telephone Encounter (Signed)
Received incoming from patient. She started having bradycardia on Friday, 01/22/17. Went to ED, 01/22/17 and plan from ED physician: "Patient's heart rate has been normal throughout her stay in the emergency department around 60 beats a minute. Patient's labs are normal. Patient sees Dr.Arida, I discussed with patient following up with cardiologist, for follow-up for holter monitor."  Patient called the on call cardiologist Saturday for heartrate still in the 40's per patient at home and was told to hold diltiazem. Called on call cardiologist on Sunday for elevated BP. Advised to take and extra lisinopril/HCTZ. Patients stated her BP was 166/90 two hours after taking the extra pill last night. This morning, BP 178/101, HR 80 prior to taking lisinopril/HCTZ 20-'25mg'$ . It has only been 25 minutes since she took the pill. She reports weakness, dizziness, and headache and some SOB (she says is from her COPD). Denies chest pain, nausea, sweating.  Advised patient to re-take BP about 2 hours from when she took the medication to see how medication is working and call me back with the value. Advised that if she developed new or worsening symptoms to proceed to emergency room and she verbalized understanding. Patient scheduled to see Ignacia Bayley, NP at 10:00am.  Patient very concerned about BP and concerned about waiting until tomorrow for medicine advice. Routing to Dr Fletcher Anon for advice.

## 2017-01-25 NOTE — Telephone Encounter (Signed)
She can take an extra dose of Lisinopril-HCTZ.

## 2017-01-25 NOTE — Telephone Encounter (Signed)
Per verbal from Dr. Fletcher Anon, pt should continue medications as advised and f/u w/Chris Sharolyn Douglas, NP tomorrow as planned. Reviewed w/pt who states she is still concerned of the elevated BP and headache. Advised pt to limit activity today, take tylenol PRN and keep f/u appt. She is agreeable w/plan.

## 2017-01-25 NOTE — Telephone Encounter (Signed)
Returned call to patient. She verbalized understanding and will take an extra dose now. She is aware of appt date and time tomorrow.

## 2017-01-25 NOTE — Telephone Encounter (Signed)
Pt calling back with her BP 161/96 HR 72

## 2017-01-25 NOTE — Telephone Encounter (Signed)
Returned call to patient. Verified BP is 161/96, HR 72. Advised message had been routed to Dr Fletcher Anon and I will be touch with his recommendations.

## 2017-01-25 NOTE — Telephone Encounter (Signed)
Pt states she was in the ED on Friday for low HR, she states now, she is having problems with her BP. States this morning her BP 178/101, HR 80. Last night her BP was 166/90, she states she called the Dr. On call last night. Please call.

## 2017-01-26 ENCOUNTER — Encounter: Payer: Self-pay | Admitting: Nurse Practitioner

## 2017-01-26 ENCOUNTER — Ambulatory Visit (INDEPENDENT_AMBULATORY_CARE_PROVIDER_SITE_OTHER): Payer: Medicare Other

## 2017-01-26 ENCOUNTER — Ambulatory Visit (INDEPENDENT_AMBULATORY_CARE_PROVIDER_SITE_OTHER): Payer: Medicare Other | Admitting: Nurse Practitioner

## 2017-01-26 VITALS — BP 136/78 | HR 92 | Ht 60.0 in | Wt 183.5 lb

## 2017-01-26 DIAGNOSIS — I480 Paroxysmal atrial fibrillation: Secondary | ICD-10-CM | POA: Diagnosis not present

## 2017-01-26 DIAGNOSIS — I10 Essential (primary) hypertension: Secondary | ICD-10-CM

## 2017-01-26 DIAGNOSIS — I4892 Unspecified atrial flutter: Secondary | ICD-10-CM | POA: Diagnosis not present

## 2017-01-26 DIAGNOSIS — E782 Mixed hyperlipidemia: Secondary | ICD-10-CM

## 2017-01-26 DIAGNOSIS — I951 Orthostatic hypotension: Secondary | ICD-10-CM | POA: Diagnosis not present

## 2017-01-26 MED ORDER — DILTIAZEM HCL ER COATED BEADS 120 MG PO CP24: 120.0000 mg | ORAL_CAPSULE | Freq: Every day | ORAL | 3 refills | Status: DC

## 2017-01-26 NOTE — Patient Instructions (Signed)
Medication Instructions:  Your physician has recommended you make the following change in your medication:  1. START Diltiazem 120 mg Once Dailoy   Testing/Procedures: Your physician has recommended that you wear an Zio monitor. Zio monitors are medical devices that record the heart's electrical activity. Doctors most often [place] these monitors to diagnose arrhythmias. Arrhythmias are problems with the speed or rhythm of the heartbeat. The monitor is a small, portable device. You can wear one while you do your normal daily activities. This is usually used to diagnose what is causing palpitations/syncope (passing out).    Follow-Up: Your physician recommends that you schedule a follow-up appointment in: 1 month with Dr. Fletcher Anon.  It was a pleasure seeing you today here in the office. Please do not hesitate to give [place] a call back if you have any further questions. [place], BSN

## 2017-01-26 NOTE — Progress Notes (Signed)
Office Visit    Patient Name: [name] Date of Encounter: 01/26/2017  Primary Care Provider:  [name], MD Primary Cardiologist:  Jerilynn Mages. Fletcher Anon, MD   Chief Complaint    71 year old female with a prior history of aortic stenosis status post bioprosthetic aortic valve replacement, atrial fibrillation and flutter on chronic flecainide and eliquis therapy, COPD on home O2, hypertension, hyperlipidemia, hypothyroidism, obesity, and Asc Ao Aneurysm repair, who presents for follow-up after recent ER evaluation for bradycardia.  Past Medical History    Past Medical History:  Diagnosis Date  . 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 s/p bioprosthetic valve replacement 2008 at [facility];  b. 01/2015 Echo: EF 60-65%, no rwma, Gr1 DD, mildly dil LA, nl RV fxn.  . Atrial flutter ([place])    a. 08/2016 s/p DCCV.  Remains on flecainide 50 mg bid.  . Basal skull fracture ([place]) 20 yrs ago  . Chronic respiratory failure ([place])   . COPD (chronic obstructive pulmonary disease) ([place])    a. on home O2 at 2L since 2008  . 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 History of cardiac cath    a. 2008 prior to Aortic aneurysm repair-->nl cors.  . History of stress test    a. 10/2015 MV: no ischemia/infarct.  [name] HLD (hyperlipidemia)   . HTN (hypertension)   . Hypothyroidism   . Obesity   . PAF (paroxysmal atrial fibrillation) ([name])    a. on Eliquis; b. CHADS2VASc = 3 (HTN, age x 1, female).  . S/P ascending aortic aneurysm repair 2008   Past Surgical History:  Procedure Laterality Date  . ABDOMINAL AORTIC ANEURYSM REPAIR  2008  . ABDOMINAL HYSTERECTOMY    . AORTIC VALVE REPLACEMENT  2008  . CARDIAC CATHETERIZATION     [facility]  . CARPAL TUNNEL RELEASE    . ELECTROPHYSIOLOGIC STUDY N/A 08/17/2016   Procedure: Cardioversion;  Surgeon: [name], MD;  Location: [facility] ORS;  Service: Cardiovascular;  Laterality: N/A;  . TUMOR EXCISION Left    x3 (arm)     Allergies  Allergies  Allergen Reactions  . Levaquin [Levofloxacin In D5w] Other (See Comments)    Reaction:  Fatigue and muscle soreness  . Soy Allergy Nausea And Vomiting  . Benadryl [Diphenhydramine Hcl (Sleep)] Palpitations  . Lasix [Furosemide] Rash  . Meloxicam Rash  . Sulfa Antibiotics Rash    History of Present Illness    71 year old female with the above complex past medical history including severe aortic stenosis status post bioprosthetic aortic valve replacement in 2008. She also underwent ascending aortic aneurysm repair at the same time. 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 She is maintaining sinus rhythm on flecainide. She is anticoagulated with eliquis. She was recently seen in the emergency department secondary to bradycardia with rates dipping into the 40s by pulse oximetry at home. In the ER, heart rates were documented in the 60s. She was discharged home. When she returned home, she again noted occasional drops into the 40s with a sensation of "blood draining from her arms". She denied presyncope or syncope but did call our on-call staff and was advised to hold her diltiazem.  Since then, she has not noticed any additional bradycardia and has not had any additional arm symptoms. Over  the past few days, she has had some nausea. Off of diltiazem, her blood pressure has been elevated. In the setting of nausea, by mouth intake has been poor and she has also had dizziness with standing. She took an additional lisinopril HCTZ last night because of elevated blood pressures and is now dizzy this morning.  She denies chest pain but does have chronic dyspnea on exertion. She denies PND, orthopnea, syncope, edema, or early satiety.  Home Medications    Prior to Admission medications   Medication Sig  Start Date End Date Taking? Authorizing Provider  acetaminophen (TYLENOL) 500 MG tablet Take 500 mg by mouth 2 (two) times daily as needed for moderate pain or headache.   Yes [provider]  albuterol-ipratropium (COMBIVENT) 18-103 MCG/ACT inhaler Inhale 1 puff into the lungs 3 (three) times daily as needed for wheezing or shortness of breath.   Yes [provider]  ALPRAZolam (XANAX) 0.25 MG tablet Take 0.5 mg by mouth at bedtime as needed for anxiety.    Yes [provider]  atorvastatin (LIPITOR) 40 MG tablet Take 40 mg by mouth at bedtime.    Yes [provider]  Calcium Carb-Cholecalciferol 600-800 MG-UNIT CHEW Chew by mouth daily.   Yes [provider]  Carboxymethylcellul-Glycerin (LUBRICATING EYE DROPS OP) Apply 1 drop to eye daily as needed (dry eyes).   Yes [provider]  Coenzyme Q10 (COQ10) 200 MG CAPS Take 200 mg by mouth daily.   Yes [provider]  DULoxetine (CYMBALTA) 60 MG capsule Take 60 mg by mouth 2 (two) times daily.    Yes [provider]  ELIQUIS 5 MG TABS tablet TAKE 1 TABLET (5 MG TOTAL) BY MOUTH 2 (TWO) TIMES DAILY. 12/07/16  Yes [name], MD  ferrous sulfate (FERROUSUL) 325 (65 FE) MG tablet Take 325 mg by mouth daily with breakfast.   Yes [provider]  flecainide (TAMBOCOR) 50 MG tablet Take 1 tablet (50 mg total) by mouth 2 (two) times daily. 08/20/16  Yes [name], MD  Ketotifen Fumarate (ALAWAY OP) Apply 1 drop to eye daily as needed (allergies).   Yes [provider]  levothyroxine (SYNTHROID, LEVOTHROID) 25 MCG tablet Take 25 mcg by mouth daily before breakfast.   Yes [provider]  lisinopril-hydrochlorothiazide (PRINZIDE,ZESTORETIC) 20-25 MG tablet TAKE ONE TABLET BY MOUTH EVERY DAY 01/19/17  Yes [name], MD  loratadine (CLARITIN) 10 MG tablet Take 10 mg by mouth daily.   Yes [provider]  mometasone (ASMANEX) 220 MCG/INH  inhaler Inhale 1 puff into the lungs 2 (two) times daily.   Yes [provider]  pantoprazole (PROTONIX) 40 MG tablet Take 40 mg by mouth daily. 12/28/16  Yes [provider]  potassium chloride (K-DUR,KLOR-CON) 10 MEQ tablet Take 10 mEq by mouth 3 (three) times daily.    Yes [provider]  sodium chloride (OCEAN) 0.65 % SOLN nasal spray Place 1 spray into both nostrils daily as needed for congestion.   Yes [provider]    Review of Systems    She has had nausea and orthostatic dizziness as outlined above. By mouth intake has been poor. No further bradycardia. She denies chest pain, palpitations, PND, orthopnea, syncope, edema, or early satiety.  All other systems reviewed and are otherwise negative except as noted above.  Physical Exam    VS:  BP 136/78 (BP Location: Left Arm, Patient Position: Sitting, Cuff Size: Normal)   Pulse 92  Ht 5' (1.524 m)   Wt 183 lb 8 oz (83.2 kg)   SpO2 97%   BMI 35.84 kg/m  , BMI Body mass index is 35.84 kg/m.  Orthostatic VS for the past 24 hrs:  BP- Lying Pulse- Lying BP- Sitting Pulse- Sitting BP- Standing at 0 minutes Pulse- Standing at 0 minutes  01/26/17 1016 134/84 91 106/72 92 110/72 94    GEN: Well nourished, well developed, in no acute distress.  HEENT: normal.  Neck: Supple, no JVD, carotid bruits, or masses. Cardiac: RRR, 2/6 systolic ejection murmur at the bilateral upper sternal borders, rubs, or gallops. No clubbing, cyanosis, edema.  Radials/DP/PT 2+ and equal bilaterally.  Respiratory:  Respirations regular and unlabored, diminished breath sounds bilaterally GI: Soft, nontender, nondistended, BS + x 4. MS: no deformity or atrophy. Skin: warm and dry, no rash. Neuro:  Strength and sensation are intact. Psych: Normal affect.  Accessory Clinical Findings    ECG - Sinus rhythm, first-degree AV block, 92 bpm, left axis deviation, LVH, mild lateral ST depression.  Assessment & Plan    1.   Paroxysmal atrial fibrillation and flutter: She remains on flecainide therapy. She was recently taken off of diltiazem due to reported bradycardia with heart rates periodically in the 40s associated with a sensation of "blood flowing out of her arms." Despite experiencing those symptoms, she was not noted to be significantly bradycardic during recent ER visit. Off of diltiazem, heart rates have risen into the 80s and 90s at home. Blood pressures also been higher at home prompting her to take an additional lisinopril HCTZ last night. I will plan to resume the diltiazem a lower dose of 120 mg daily. I will also place a ZIO patch to assess for bradycardia. I would question if perhaps she was experiencing PACs or PVCs which would not register on her pulse oximeter and thus give the appearance of bradycardia. She remains on eliquis therapy.  2. Essential hypertension: Blood pressure has been elevated at home since coming off of diltiazem. She is actually orthostatic and dizzy today in the setting of poor by mouth intake secondary to nausea over the past few days. I've encouraged her to increase her by mouth fluids. I suspect taking an additional lisinopril HCTZ last night may be playing a role today. Though I am resuming her diltiazem as outlined above, I have asked her not to start this until tomorrow and we will just have to accept a higher blood pressure this evening.  3. Hyperlipidemia: She remains on statin therapy. We do not have lipid profile on record. This is followed by primary care.  4. Orthostatic hypotension: See #2.  5. COPD: Stable on home O2.  6. History of bioprosthetic aortic valve replacement: Stable by most recent echo in May 2016.  7. Disposition: Follow-up monitoring as outlined above. I will see back in clinic in roughly 1 month.   Murray Hodgkins, NP 01/26/2017, 1:14 PM

## 2017-01-28 ENCOUNTER — Telehealth: Payer: Self-pay | Admitting: Cardiovascular Disease

## 2017-01-28 NOTE — Telephone Encounter (Signed)
Pt has a question regarding her monitor. She is not sure how long she should wear it.

## 2017-01-28 NOTE — Telephone Encounter (Signed)
Spoke with patient and she just wanted to know when she could remove the monitor. Reviewed chart and we placed monitor on 01/26/17 and instructed her to try and wear it until 02/09/17. She verbalized understanding and had no further questions at this time. Let her know to call back if she had any further questions or problems.

## 2017-02-15 DIAGNOSIS — R001 Bradycardia, unspecified: Secondary | ICD-10-CM | POA: Diagnosis not present

## 2017-02-16 DIAGNOSIS — I4891 Unspecified atrial fibrillation: Secondary | ICD-10-CM | POA: Diagnosis not present

## 2017-02-16 DIAGNOSIS — F331 Major depressive disorder, recurrent, moderate: Secondary | ICD-10-CM | POA: Diagnosis not present

## 2017-02-16 DIAGNOSIS — Z9981 Dependence on supplemental oxygen: Secondary | ICD-10-CM | POA: Diagnosis not present

## 2017-02-16 DIAGNOSIS — J4540 Moderate persistent asthma, uncomplicated: Secondary | ICD-10-CM | POA: Diagnosis not present

## 2017-02-25 DIAGNOSIS — G4733 Obstructive sleep apnea (adult) (pediatric): Secondary | ICD-10-CM | POA: Diagnosis not present

## 2017-03-02 ENCOUNTER — Ambulatory Visit: Payer: Medicare Other | Admitting: Cardiovascular Disease

## 2017-03-12 ENCOUNTER — Ambulatory Visit (INDEPENDENT_AMBULATORY_CARE_PROVIDER_SITE_OTHER): Payer: Medicare Other | Admitting: Cardiovascular Disease

## 2017-03-12 ENCOUNTER — Encounter: Payer: Self-pay | Admitting: Cardiovascular Disease

## 2017-03-12 VITALS — BP 138/64 | HR 76 | Ht 60.0 in | Wt 183.5 lb

## 2017-03-12 DIAGNOSIS — I4892 Unspecified atrial flutter: Secondary | ICD-10-CM

## 2017-03-12 DIAGNOSIS — I10 Essential (primary) hypertension: Secondary | ICD-10-CM

## 2017-03-12 DIAGNOSIS — Z953 Presence of xenogenic heart valve: Secondary | ICD-10-CM | POA: Diagnosis not present

## 2017-03-12 NOTE — Patient Instructions (Signed)
Medication Instructions: Continue same medications.   Labwork: None.   Procedures/Testing: None.   Follow-Up: 6 months with Dr. Arida.   Any Additional Special Instructions Will Be Listed Below (If Applicable).     If you need a refill on your cardiac medications before your next appointment, please call your pharmacy.   

## 2017-03-12 NOTE — Progress Notes (Signed)
Cardiology Office Note   Date:  03/12/2017   ID:  [name], DOB August 22, 1946, MRN [id number]  PCP:  [name], MD  Cardiologist:   [name], MD   Chief Complaint  Patient presents with  . other    6 month follow up & discuss Zio monitor. Meds reviewed by the pt. verbally. Pt. was told had a mild stroke by an MRI in March 2018.       History of Present Illness: [name] is a 71 y.o. female who presents for A follow-up visit regarding persistent atrial fibrillation/flutter and valvular heart disease. 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  Cardiac catheterization before valve replacement showed no obstructive coronary artery disease.  Most recent echocardiogram in May 2016  showed normal LV systolic function with normal functioning of bioprosthetic aortic valve with mildly increased gradient.  Nuclear stress test in February 2017 was normal.  She has been treated with flecainide for atrial flutter. She developed symptomatic bradycardia and may and the dose of diltiazem was decreased to 120 mg once daily. She had a 2 week outpatient telemetry which showed normal sinus rhythm with 3 brief episodes of atrial fibrillation. Average heart rate was 69 bpm.  She has been doing very well with no chest pain or worsening dyspnea. No recent episodes of palpitations or bradycardia.  Past Medical History:  Diagnosis Date  . 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 s/p bioprosthetic valve replacement 2008 at [facility];  b. 01/2015 Echo: EF 60-65%, no rwma, Gr1 DD, mildly dil LA, nl RV fxn.  . Atrial flutter ([place])    a. 08/2016 s/p DCCV.  Remains on flecainide 50 mg bid.  . Basal skull fracture ([place]) 20 yrs ago  . Chronic  respiratory failure ([place])   . COPD (chronic obstructive pulmonary disease) ([place])    a. on home O2 at 2L since 2008  . 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 History of cardiac cath    a. 2008 prior to Aortic aneurysm repair-->nl cors.  . History of stress test    a. 10/2015 MV: no ischemia/infarct.  [name] HLD (hyperlipidemia)   . HTN (hypertension)   . Hypothyroidism   . Obesity   . PAF (paroxysmal atrial fibrillation) ([name])    a. on Eliquis; b. CHADS2VASc = 3 (HTN, age x 1, female).  . S/P ascending aortic aneurysm repair 2008    Past Surgical History:  Procedure Laterality Date  . ABDOMINAL AORTIC ANEURYSM REPAIR  2008  . ABDOMINAL HYSTERECTOMY    . AORTIC VALVE REPLACEMENT  2008  . CARDIAC CATHETERIZATION     [facility]  . CARPAL TUNNEL RELEASE    . ELECTROPHYSIOLOGIC STUDY N/A 08/17/2016   Procedure: Cardioversion;  Surgeon: [name], MD;  Location: [facility] ORS;  Service: Cardiovascular;  Laterality: N/A;  . TUMOR EXCISION Left    x3 (arm)     Current Outpatient Prescriptions  Medication Sig Dispense Refill  . acetaminophen (TYLENOL) 500 MG tablet Take 500 mg by mouth 2 (two) times daily as needed for moderate pain or headache.    . albuterol-ipratropium (COMBIVENT) 18-103 MCG/ACT inhaler Inhale 1 puff into  the lungs 3 (three) times daily as needed for wheezing or shortness of breath.    . ALPRAZolam (XANAX) 0.25 MG tablet Take 0.5 mg by mouth at bedtime as needed for anxiety.     [name] atorvastatin (LIPITOR) 40 MG tablet Take 40 mg by mouth at bedtime.     . Coenzyme Q10 (COQ10) 200 MG CAPS Take 200 mg by mouth daily.    [name] diltiazem (DILTIAZEM CD) 120 MG 24 hr capsule Take 1 capsule (120 mg total) by mouth daily. 30 capsule 3  . DULoxetine (CYMBALTA) 60 MG capsule Take 60 mg by mouth 2 (two) times daily.     [name] ELIQUIS 5 MG TABS tablet TAKE 1 TABLET (5 MG TOTAL) BY MOUTH 2 (TWO) TIMES DAILY. 60 tablet 3  . ferrous sulfate (FERROUSUL) 325 (65 FE) MG tablet  Take 325 mg by mouth daily with breakfast.    . flecainide (TAMBOCOR) 50 MG tablet Take 1 tablet (50 mg total) by mouth 2 (two) times daily. 60 tablet 6  . Ketotifen Fumarate (ALAWAY OP) Apply 1 drop to eye daily as needed (allergies).    [name] levothyroxine (SYNTHROID, LEVOTHROID) 25 MCG tablet Take 25 mcg by mouth daily before breakfast.    . lisinopril-hydrochlorothiazide (PRINZIDE,ZESTORETIC) 20-25 MG tablet TAKE ONE TABLET BY MOUTH EVERY DAY 30 tablet 3  . loratadine (CLARITIN) 10 MG tablet Take 10 mg by mouth daily.    . mometasone (ASMANEX) 220 MCG/INH inhaler Inhale 1 puff into the lungs 2 (two) times daily.    . pantoprazole (PROTONIX) 40 MG tablet Take 40 mg by mouth daily.   4  . potassium chloride (K-DUR,KLOR-CON) 10 MEQ tablet Take 10 mEq by mouth 3 (three) times daily.     . sodium chloride (OCEAN) 0.65 % SOLN nasal spray Place 1 spray into both nostrils daily as needed for congestion.     No current facility-administered medications for this visit.     Allergies:   Levaquin [levofloxacin in d5w]; Soy allergy; Benadryl [diphenhydramine hcl (sleep)]; Lasix [furosemide]; Meloxicam; and Sulfa antibiotics    Social History:  The patient  reports that she has quit smoking. She has never used smokeless tobacco. She reports that she does not drink alcohol or use drugs.   Family History:  The patient's family history includes Stroke in her father and paternal grandmother.    ROS:  Please see the history of present illness.   Otherwise, review of systems are positive for none.   All other systems are reviewed and negative.    PHYSICAL EXAM: VS:  BP 138/64 (BP Location: Left Arm, Patient Position: Sitting, Cuff Size: Normal)   Pulse 76   Ht 5' (1.524 m)   Wt 183 lb 8 oz (83.2 kg)   BMI 35.84 kg/m  , BMI Body mass index is 35.84 kg/m. GEN: Well nourished, well developed, in no acute distress  HEENT: normal  Neck: no JVD, carotid bruits, or masses Cardiac: RRR; no  rubs, or  gallops,no edema .  is a 2/6 systolic ejection murmur in the aortic area.  Respiratory:  clear to auscultation bilaterally, normal work of breathing GI: soft, nontender, nondistended, + BS MS: no deformity or atrophy  Skin: warm and dry, no rash Neuro:  Strength and sensation are intact Psych: euthymic mood, full affect   EKG:  EKG is ordered today. Normal sinus rhythm with minimal LVH and repolarization abnormalities.  Recent Labs: 01/22/2017: BUN 14; Creatinine, Ser 0.72; Hemoglobin 11.9; Platelets 273; Potassium 3.6; Sodium  134    Lipid Panel No results found for: CHOL, TRIG, HDL, CHOLHDL, VLDL, LDLCALC, LDLDIRECT    Wt Readings from Last 3 Encounters:  03/12/17 183 lb 8 oz (83.2 kg)  01/26/17 183 lb 8 oz (83.2 kg)  01/22/17 185 lb (83.9 kg)         ASSESSMENT AND PLAN:  1.  Paroxysmal Atrial flutter:  She is maintaining in sinus rhythm with flecainide. Heart rate stabilized on current dose of diltiazem. Continue long-term anticoagulation with Eliquis  2. Essential hypertension: Blood pressure is well controlled on current medications.  3. History of aortic valve replacement with bioprosthetic valve: This was functioning normally by echo in May 2016.   Disposition:   FU with me in 6 months  Signed,  [name], MD  03/12/2017 2:25 PM    [facility]

## 2017-04-04 ENCOUNTER — Other Ambulatory Visit: Payer: Self-pay | Admitting: Cardiovascular Disease

## 2017-04-07 DIAGNOSIS — D229 Melanocytic nevi, unspecified: Secondary | ICD-10-CM | POA: Diagnosis not present

## 2017-04-07 DIAGNOSIS — D1801 Hemangioma of skin and subcutaneous tissue: Secondary | ICD-10-CM | POA: Diagnosis not present

## 2017-04-07 DIAGNOSIS — L821 Other seborrheic keratosis: Secondary | ICD-10-CM | POA: Diagnosis not present

## 2017-04-07 DIAGNOSIS — L578 Other skin changes due to chronic exposure to nonionizing radiation: Secondary | ICD-10-CM | POA: Diagnosis not present

## 2017-05-03 ENCOUNTER — Other Ambulatory Visit: Payer: Self-pay | Admitting: Cardiovascular Disease

## 2017-05-06 DIAGNOSIS — I4891 Unspecified atrial fibrillation: Secondary | ICD-10-CM | POA: Diagnosis not present

## 2017-05-06 DIAGNOSIS — M5010 Cervical disc disorder with radiculopathy, unspecified cervical region: Secondary | ICD-10-CM | POA: Diagnosis not present

## 2017-05-06 DIAGNOSIS — I10 Essential (primary) hypertension: Secondary | ICD-10-CM | POA: Diagnosis not present

## 2017-05-06 DIAGNOSIS — J449 Chronic obstructive pulmonary disease, unspecified: Secondary | ICD-10-CM | POA: Diagnosis not present

## 2017-05-12 DIAGNOSIS — G4733 Obstructive sleep apnea (adult) (pediatric): Secondary | ICD-10-CM | POA: Diagnosis not present

## 2017-05-13 DIAGNOSIS — E039 Hypothyroidism, unspecified: Secondary | ICD-10-CM | POA: Diagnosis not present

## 2017-05-13 DIAGNOSIS — I10 Essential (primary) hypertension: Secondary | ICD-10-CM | POA: Diagnosis not present

## 2017-05-13 DIAGNOSIS — Z0001 Encounter for general adult medical examination with abnormal findings: Secondary | ICD-10-CM | POA: Diagnosis not present

## 2017-05-13 DIAGNOSIS — E782 Mixed hyperlipidemia: Secondary | ICD-10-CM | POA: Diagnosis not present

## 2017-05-18 ENCOUNTER — Other Ambulatory Visit: Payer: Self-pay | Admitting: Cardiovascular Disease

## 2017-05-18 DIAGNOSIS — J4540 Moderate persistent asthma, uncomplicated: Secondary | ICD-10-CM | POA: Diagnosis not present

## 2017-05-18 DIAGNOSIS — J9611 Chronic respiratory failure with hypoxia: Secondary | ICD-10-CM | POA: Diagnosis not present

## 2017-05-18 DIAGNOSIS — G4733 Obstructive sleep apnea (adult) (pediatric): Secondary | ICD-10-CM | POA: Diagnosis not present

## 2017-05-18 DIAGNOSIS — I480 Paroxysmal atrial fibrillation: Secondary | ICD-10-CM | POA: Diagnosis not present

## 2017-05-20 ENCOUNTER — Other Ambulatory Visit: Payer: Self-pay | Admitting: Nurse Practitioner

## 2017-05-25 ENCOUNTER — Other Ambulatory Visit: Payer: Self-pay | Admitting: Nurse Practitioner

## 2017-06-02 DIAGNOSIS — R0602 Shortness of breath: Secondary | ICD-10-CM | POA: Diagnosis not present

## 2017-06-03 DIAGNOSIS — G4733 Obstructive sleep apnea (adult) (pediatric): Secondary | ICD-10-CM | POA: Diagnosis not present

## 2017-06-16 DIAGNOSIS — Z1231 Encounter for screening mammogram for malignant neoplasm of breast: Secondary | ICD-10-CM | POA: Diagnosis not present

## 2017-06-21 DIAGNOSIS — Z23 Encounter for immunization: Secondary | ICD-10-CM | POA: Diagnosis not present

## 2017-06-22 DIAGNOSIS — Z9981 Dependence on supplemental oxygen: Secondary | ICD-10-CM | POA: Diagnosis not present

## 2017-06-22 DIAGNOSIS — I10 Essential (primary) hypertension: Secondary | ICD-10-CM | POA: Diagnosis not present

## 2017-06-22 DIAGNOSIS — J069 Acute upper respiratory infection, unspecified: Secondary | ICD-10-CM | POA: Diagnosis not present

## 2017-06-29 DIAGNOSIS — I10 Essential (primary) hypertension: Secondary | ICD-10-CM | POA: Diagnosis not present

## 2017-06-29 DIAGNOSIS — J209 Acute bronchitis, unspecified: Secondary | ICD-10-CM | POA: Diagnosis not present

## 2017-06-29 DIAGNOSIS — J4540 Moderate persistent asthma, uncomplicated: Secondary | ICD-10-CM | POA: Diagnosis not present

## 2017-06-29 DIAGNOSIS — I4891 Unspecified atrial fibrillation: Secondary | ICD-10-CM | POA: Diagnosis not present

## 2017-07-15 ENCOUNTER — Telehealth: Payer: Self-pay | Admitting: Cardiovascular Disease

## 2017-07-15 NOTE — Telephone Encounter (Signed)
Pt c/o of Chest Pain: STAT if CP now or developed within 24 hours  1. Are you having CP right now? No   2. Are you experiencing any other symptoms (ex. SOB, nausea, vomiting, sweating)? Under stress sob nausea   3. How long have you been experiencing CP? 1 month   4. Is your CP continuous or coming and going? Comes and goes   5. Have you taken Nitroglycerin? No ?

## 2017-07-15 NOTE — Telephone Encounter (Signed)
Patient also c/o dizziness

## 2017-07-15 NOTE — Telephone Encounter (Signed)
Patient states that she has been having chest tightness, tired, and just stressed out. She states that everything is going up and down blood pressure, heart rate, and that she is just not feeling well. Blood pressure today was 143/84 pulse was 71 with oxygen of 93%. She states that she had some chest pain after going to the store yesterday. She also has some stressful family dynamics. She reports having chest pain, shortness of breath, decreased energy, and she just doesn't feel right. She states that she is not hurting right this moment but she is concerned. Reviewed signs and symptoms to monitor for and when she should see evaluation in the ED. Let her know that I would have someone call to assist her with scheduling follow up appointment as well. She verbalized understanding with no further questions at this time.

## 2017-07-16 NOTE — Telephone Encounter (Signed)
Lmov for patient to call back and schedule appt

## 2017-07-28 ENCOUNTER — Telehealth: Payer: Self-pay

## 2017-07-28 MED ORDER — DILTIAZEM HCL ER COATED BEADS 120 MG PO CP24: 120.0000 mg | ORAL_CAPSULE | Freq: Every day | ORAL | 3 refills | Status: DC

## 2017-07-28 MED ORDER — LISINOPRIL-HYDROCHLOROTHIAZIDE 20-25 MG PO TABS: 1.0000 | ORAL_TABLET | Freq: Every day | ORAL | 3 refills | Status: DC

## 2017-08-02 ENCOUNTER — Other Ambulatory Visit: Payer: Self-pay | Admitting: Cardiovascular Disease

## 2017-08-02 NOTE — Telephone Encounter (Signed)
Please review for refill, Thanks !  

## 2017-08-03 ENCOUNTER — Ambulatory Visit: Payer: Medicare Other | Admitting: Nurse Practitioner

## 2017-08-03 ENCOUNTER — Encounter: Payer: Self-pay | Admitting: Nurse Practitioner

## 2017-08-03 VITALS — BP 122/64 | HR 77 | Ht 60.0 in | Wt 181.0 lb

## 2017-08-03 DIAGNOSIS — Z952 Presence of prosthetic heart valve: Secondary | ICD-10-CM | POA: Diagnosis not present

## 2017-08-03 DIAGNOSIS — J9610 Chronic respiratory failure, unspecified whether with hypoxia or hypercapnia: Secondary | ICD-10-CM | POA: Diagnosis not present

## 2017-08-03 DIAGNOSIS — I480 Paroxysmal atrial fibrillation: Secondary | ICD-10-CM | POA: Diagnosis not present

## 2017-08-03 DIAGNOSIS — I10 Essential (primary) hypertension: Secondary | ICD-10-CM | POA: Diagnosis not present

## 2017-08-03 DIAGNOSIS — R079 Chest pain, unspecified: Secondary | ICD-10-CM

## 2017-08-03 MED ORDER — DILTIAZEM HCL ER COATED BEADS 120 MG PO CP24: 120.0000 mg | ORAL_CAPSULE | Freq: Every day | ORAL | 3 refills | Status: DC

## 2017-08-03 MED ORDER — LISINOPRIL-HYDROCHLOROTHIAZIDE 20-25 MG PO TABS: 1.0000 | ORAL_TABLET | Freq: Every day | ORAL | 3 refills | Status: DC

## 2017-08-03 NOTE — Patient Instructions (Addendum)
Medication Instructions:  Please continue your current medications  Labwork: None  Testing/Procedures: Your physician has requested that you have an echocardiogram. Echocardiography is a painless test that uses sound waves to create images of your heart. It provides your doctor with information about the size and shape of your heart and how well your heart's chambers and valves are working. This procedure takes approximately one hour. There are no restrictions for this procedure.  [place]  Your caregiver has ordered a Stress Test with nuclear imaging. The purpose of this test is to evaluate the blood supply to your heart muscle. This procedure is referred to as a "Non-Invasive Stress Test." This is because other than having an IV started in your vein, nothing is inserted or "invades" your body. Cardiac stress tests are done to find areas of poor blood flow to the heart by determining the extent of coronary artery disease (CAD). Some patients exercise on a treadmill, which naturally increases the blood flow to your heart, while others who are  unable to walk on a treadmill due to physical limitations have a pharmacologic/chemical stress agent called Lexiscan . This medicine will mimic walking on a treadmill by temporarily increasing your coronary blood flow.   Please note: these test may take anywhere between 2-4 hours to complete  PLEASE REPORT TO [facility] AT THE FIRST DESK WILL DIRECT YOU WHERE TO GO  Date of Procedure:____Monday, December 3____  Arrival Time for Procedure:____8:45 am______  How to prepare for your Myoview test:  1. Do not eat or drink after midnight 2. No caffeine for 24 hours prior to test 3. No smoking 24 hours prior to test. 4. Your medication may be taken with water.  If your doctor stopped a medication because of this test, do not take that medication. 5. Ladies, please do not wear dresses.  Skirts or pants are appropriate.  Please wear a short sleeve shirt. 6. No perfume, cologne or lotion.  Follow-Up: With Dr. Fletcher Anon in 2-3 months.  If you need a refill on your cardiac medications before your next appointment, please call your pharmacy.  Echocardiogram An echocardiogram, or echocardiography, uses sound waves (ultrasound) to produce an image of your heart. The echocardiogram is simple, painless, obtained within a short period of time, and offers valuable information to your health care provider. The images from an echocardiogram can provide information such as:  Evidence of coronary artery disease (CAD).  Heart size.  Heart muscle function.  Heart valve function.  Aneurysm detection.  Evidence of a past heart attack.  Fluid buildup around the heart.  Heart muscle thickening.  Assess heart valve function.  Tell a health care provider about:  Any allergies you have.  All medicines you are taking, including vitamins, herbs, eye drops, creams, and over-the-counter medicines.  Any problems you or family members have had with anesthetic medicines.  Any blood disorders you have.  Any surgeries you have had.  Any medical conditions you have.  Whether you are pregnant or may be pregnant. What happens before the procedure? No special preparation is needed. Eat and drink normally. What happens during the procedure?  In order to produce an image of your heart, gel will be applied to your chest and a wand-like tool (transducer) will be moved over your chest. The gel will help transmit the sound waves from the transducer. The sound waves will harmlessly bounce off your heart to allow the heart images to be captured in real-time  motion. These images will then be recorded.  You may need an IV to receive a medicine that improves the quality of the pictures. What happens after the procedure? You may return to your normal schedule including diet, activities, and medicines, unless your health care  provider tells you otherwise. This information is not intended to replace advice given to you by your health care provider. Make sure you discuss any questions you have with your health care provider. Document Released: 08/21/2000 Document Revised: 04/11/2016 Document Reviewed: 05/01/2013 Elsevier Interactive Patient Education  2017 [place]. Cardiac Nuclear Scan A cardiac nuclear scan is a test that measures blood flow to the heart when a person is resting and when he or she is exercising. The test looks for problems such as:  Not enough blood reaching a portion of the heart.  The heart muscle not working normally.  You may need this test if:  You have heart disease.  You have had abnormal lab results.  You have had heart surgery or angioplasty.  You have chest pain.  You have shortness of breath.  In this test, a radioactive dye (tracer) is injected into your bloodstream. After the tracer has traveled to your heart, an imaging device is used to measure how much of the tracer is absorbed by or distributed to various areas of your heart. This procedure is usually done at a hospital and takes 2-4 hours. Tell a health care provider about:  Any allergies you have.  All medicines you are taking, including vitamins, herbs, eye drops, creams, and over-the-counter medicines.  Any problems you or family members have had with the use of anesthetic medicines.  Any blood disorders you have.  Any surgeries you have had.  Any medical conditions you have.  Whether you are pregnant or may be pregnant. What are the risks? Generally, this is a safe procedure. However, problems may occur, including:  Serious chest pain and heart attack. This is only a risk if the stress portion of the test is done.  Rapid heartbeat.  Sensation of warmth in your chest. This usually passes quickly.  What happens before the procedure?  Ask your health care provider about changing or stopping your  regular medicines. This is especially important if you are taking diabetes medicines or blood thinners.  Remove your jewelry on the day of the procedure. What happens during the procedure?  An IV tube will be inserted into one of your veins.  Your health care provider will inject a small amount of radioactive tracer through the tube.  You will wait for 20-40 minutes while the tracer travels through your bloodstream.  Your heart activity will be monitored with an electrocardiogram (ECG).  You will lie down on an exam table.  Images of your heart will be taken for about 15-20 minutes.  You may be asked to exercise on a treadmill or stationary bike. While you exercise, your heart's activity will be monitored with an ECG, and your blood pressure will be checked. If you are unable to exercise, you may be given a medicine to increase blood flow to parts of your heart.  When blood flow to your heart has peaked, a tracer will again be injected through the IV tube.  After 20-40 minutes, you will get back on the exam table and have more images taken of your heart.  When the procedure is over, your IV tube will be removed. The procedure may vary among health care providers and hospitals. Depending on the  type of tracer used, scans may need to be repeated 3-4 hours later. What happens after the procedure?  Unless your health care provider tells you otherwise, you may return to your normal schedule, including diet, activities, and medicines.  Unless your health care provider tells you otherwise, you may increase your fluid intake. This will help flush the contrast dye from your body. Drink enough fluid to keep your urine clear or pale yellow.  It is up to you to get your test results. Ask your health care provider, or the department that is doing the test, when your results will be ready. Summary  A cardiac nuclear scan measures the blood flow to the heart when a person is resting and when he or  she is exercising.  You may need this test if you are at risk for heart disease.  Tell your health care provider if you are pregnant.  Unless your health care provider tells you otherwise, increase your fluid intake. This will help flush the contrast dye from your body. Drink enough fluid to keep your urine clear or pale yellow. This information is not intended to replace advice given to you by your health care provider. Make sure you discuss any questions you have with your health care provider. Document Released: 09/18/2004 Document Revised: 08/26/2016 Document Reviewed: 08/02/2013 Elsevier Interactive Patient Education  2017 [employer].

## 2017-08-03 NOTE — Telephone Encounter (Signed)
Error

## 2017-08-03 NOTE — Progress Notes (Signed)
Office Visit    Patient Name: [name] Date of Encounter: 08/03/2017  Primary Care Provider:  [name], MD Primary Cardiologist:  Jerilynn Mages. Fletcher Anon, MD   Chief Complaint    71 year old ? 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  Past Medical History    Past Medical History:  Diagnosis Date  . 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 s/p bioprosthetic valve replacement 2008 at [facility];  b. 01/2015 Echo: EF 60-65%, no rwma, Gr1 DD, mildly dil LA, nl RV fxn.  . Atrial flutter ([place])    a. 08/2016 s/p DCCV.  Remains on flecainide 50 mg bid.  . Basal skull fracture ([place]) 20 yrs ago  . Chronic respiratory failure ([place])   . COPD (chronic obstructive pulmonary disease) ([place])    a. on home O2 at 2L since 2008  . 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 History of cardiac cath    a. 2008 prior to Aortic aneurysm repair-->nl cors.  . History of stress test    a. 10/2015 MV: no ischemia/infarct.  [name] HLD (hyperlipidemia)   . HTN (hypertension)   . Hypothyroidism   . Obesity   . PAF (paroxysmal atrial fibrillation) ([name])    a. on Eliquis; b. CHADS2VASc = 3 (HTN, age x 1, female).  . S/P ascending aortic aneurysm repair 2008   Past Surgical History:  Procedure Laterality Date  . ABDOMINAL AORTIC ANEURYSM REPAIR  2008  . ABDOMINAL HYSTERECTOMY    . AORTIC VALVE REPLACEMENT  2008  . CARDIAC CATHETERIZATION     [facility]  . CARPAL TUNNEL RELEASE    . ELECTROPHYSIOLOGIC STUDY N/A 08/17/2016   Procedure: Cardioversion;  Surgeon: [name], MD;  Location: [facility] ORS;  Service: Cardiovascular;  Laterality: N/A;  . TUMOR EXCISION Left    x3 (arm)     Allergies  Allergies  Allergen Reactions  . Levaquin [Levofloxacin In D5w] Other (See Comments)    Reaction:  Fatigue and muscle soreness  . Levofloxacin Other (See Comments)    Fatigue, muscle soreness  . Soy Allergy Nausea And Vomiting and Hives  . Benadryl [Diphenhydramine Hcl (Sleep)] Palpitations  . Cetirizine Palpitations  . Diphenhydramine-Zinc Acetate Palpitations  . Lasix [Furosemide] Rash  . Meloxicam Rash  . Sulfa Antibiotics Rash    History of Present Illness    71 year old female with the above complex past medical history including bicuspid aortic valve with severe aortic stenosis status post bioprosthetic aortic valve replacement and also a sending aortic aneurysm repair in 2008.  Other history includes COPD on home O2, sleep apnea on CPAP, hypertension, hyperlipidemia, nonobstructive CAD, and paroxysmal atrial for ablation and flutter status post cardioversion in December 2017.  She has been maintained on flecainide and diltiazem therapy.  She is anticoagulated with Eliquis.  She had been doing well until about 3 months ago, when she began to experience daily, intermittent somewhat sharp midsternal chest discomfort that radiates through to her back, lasting 1 or 2 minutes, and resolving spontaneously.  She also has noted some worsening of her chronic dyspnea on exertion.  Activity at this point is very limited.  She denies PND, orthopnea, palpitations,  dizziness, syncope, edema, or early satiety.  She has a lot of life stress right now with her daughter and granddaughter living with her.  Her daughter is pregnant with her third child.  Neither her daughter nor granddaughter work and make demands on her for use of her car and money.  Home Medications    Prior to Admission medications   Medication Sig Start Date End Date Taking? Authorizing Provider  acetaminophen (TYLENOL) 500 MG tablet Take 500 mg by mouth 2 (two) times daily as needed for moderate pain or headache.    Yes [provider]  albuterol-ipratropium (COMBIVENT) 18-103 MCG/ACT inhaler Inhale 1 puff into the lungs 3 (three) times daily as needed for wheezing or shortness of breath.   Yes [provider]  ALPRAZolam (XANAX) 0.25 MG tablet Take 0.5 mg by mouth at bedtime as needed for anxiety.    Yes [provider]  atorvastatin (LIPITOR) 40 MG tablet Take 40 mg by mouth at bedtime.    Yes [provider]  Coenzyme Q10 (COQ10) 200 MG CAPS Take 200 mg by mouth daily.   Yes [provider]  diltiazem (CARDIZEM CD) 120 MG 24 hr capsule Take 1 capsule (120 mg total) by mouth daily. 08/03/17  Yes Rogelia Mire, NP  DULoxetine (CYMBALTA) 60 MG capsule Take 60 mg by mouth 2 (two) times daily.    Yes [provider]  ELIQUIS 5 MG TABS tablet TAKE 1 TABLET BY MOUTH TWICE A DAY 08/02/17  Yes Gollan, Kathlene November, MD  ferrous sulfate (FERROUSUL) 325 (65 FE) MG tablet Take 325 mg by mouth daily with breakfast.   Yes [provider]  flecainide (TAMBOCOR) 50 MG tablet TAKE 1 TABLET (50 MG TOTAL) BY MOUTH 2 (TWO) TIMES DAILY. 05/03/17  Yes [name], MD  Ketotifen Fumarate (ALAWAY OP) Apply 1 drop to eye daily as needed (allergies).   Yes [provider]  levothyroxine (SYNTHROID, LEVOTHROID) 25 MCG tablet Take 25 mcg by mouth daily before breakfast.   Yes [provider]  lisinopril-hydrochlorothiazide (PRINZIDE,ZESTORETIC) 20-25 MG tablet Take 1 tablet by mouth daily. 08/03/17  Yes Rogelia Mire, NP  loratadine (CLARITIN) 10 MG tablet Take 10 mg by mouth daily.   Yes [provider]  mometasone (ASMANEX) 220 MCG/INH inhaler Inhale 1 puff into the lungs 2 (two) times daily.   Yes [provider]  pantoprazole (PROTONIX) 40 MG tablet Take 40 mg by mouth daily.  02/25/17  Yes [provider]  potassium chloride (K-DUR,KLOR-CON) 10 MEQ tablet Take 10 mEq by mouth 3 (three) times daily.    Yes  [provider]  sodium chloride (OCEAN) 0.65 % SOLN nasal spray Place 1 spray into both nostrils daily as needed for congestion.   Yes [provider]    Review of Systems    Patient has been experience some chest pain and some worsening of baseline level of dyspnea on exertion over the past 2-3 months.  She denies palpitations, PND, orthopnea, dizziness, syncope, edema, or early satiety.  All other systems reviewed and are otherwise negative except as noted above.  Physical Exam    VS:  BP 122/64 (BP Location: Left Arm, Patient Position: Sitting, Cuff Size: Normal)   Pulse 77   Ht 5' (1.524 m)   Wt 181 lb (82.1 kg)   BMI 35.35 kg/m  , BMI Body mass index is 35.35 kg/m. GEN: Well nourished, well developed, in no acute distress.  HEENT: normal.  Neck: Supple, no JVD, carotid bruits, or masses. Cardiac: RRR, 2/6 systolic ejection murmur at the upper sternal borders, no rubs, or gallops. No clubbing, cyanosis, edema.  Radials/DP/PT 2+ and equal bilaterally.  Respiratory:  Respirations regular and unlabored, clear to auscultation bilaterally. GI: Soft, nontender, nondistended, BS + x 4. MS: no deformity or atrophy. Skin: warm and dry, no rash. Neuro:  Strength and sensation are intact. Psych: Normal affect.  Accessory Clinical Findings    ECG -regular sinus rhythm, 77, PVC, left axis deviation, left anterior fascicular block, LVH, nonspecific ST changes.  No acute changes.  Assessment & Plan    1.  Midsternal chest pain: Patient has been having daily episodes of intermittent midsternal chest discomfort radiating through to her back without associated symptoms, typically lasting a minute or so, and resolving spontaneously.  She has also noted worsening of baseline level of dyspnea on exertion.  She attributes both of these to life stress at home in the setting of her daughter and granddaughter living with her.  She had previously normal coronary arteries on  catheterization in 2008 with nonischemic Myoview in early 2017.  I will repeat stress testing to rule out ischemia.  We will also check an echocardiogram to reevaluate LV and valvular function.  2.  Status post bioprosthetic aortic valve replacement: In setting of worsening dyspnea, plan on following up echocardiogram as above.  3.  Essential hypertension: Blood pressure stable today.  Continue calcium channel blocker, ACE inhibitor, and diuretic therapy.  4.  Paroxysmal atrial fibrillation and flutter: No recent palpitations.  Maintaining sinus rhythm on flecainide and diltiazem.  She is anticoagulated with Eliquis and had labs through her primary care provider in September which showed normal H&H.  5.  COPD/chronic respiratory failure: With chronic dyspnea on exertion.  This is worsened over the past few months.  Following up echo as above.  If echo and stress testing look okay, would recommend pulmonology follow-up.  6.  Status post ascending aortic aneurysm repair: She follows up at [facility] with annual CT angios.  She is due to follow-up in the next few months.  7.  Disposition: Follow-up echo and stress testing as above.  Follow-up in clinic in 2-3 months or sooner if necessary.  Murray Hodgkins, NP 08/03/2017, 4:35 PM

## 2017-08-05 ENCOUNTER — Ambulatory Visit: Payer: Medicare Other | Admitting: Physician Assistant

## 2017-08-09 ENCOUNTER — Ambulatory Visit
Admission: RE | Admit: 2017-08-09 | Discharge: 2017-08-09 | Disposition: A | Discharge status: Home / Self Care | Payer: Medicare Other | Source: Ambulatory Visit | Attending: Nurse Practitioner | Admitting: Nurse Practitioner

## 2017-08-09 DIAGNOSIS — R079 Chest pain, unspecified: Secondary | ICD-10-CM | POA: Diagnosis not present

## 2017-08-09 LAB — NM MYOCAR MULTI W/SPECT W/WALL MOTION / EF
Estimated workload: 1 METS
Exercise duration (min): 1 min
Exercise duration (sec): 0 s
LV dias vol: 40 mL (ref 46–106)
LV sys vol: 10 mL
MPHR: 151 {beats}/min
Peak HR: 90 {beats}/min
Percent HR: 59 %
Rest HR: 71 {beats}/min
SDS: 2
SRS: 16
SSS: 18
TID: 0.78

## 2017-08-09 MED ORDER — TECHNETIUM TC 99M TETROFOSMIN IV KIT
13.7790 | PACK | Freq: Once | INTRAVENOUS | Status: AC | PRN
  Administered 2017-08-09: 13.779 via INTRAVENOUS

## 2017-08-09 MED ORDER — REGADENOSON 0.4 MG/5ML IV SOLN
0.4000 mg | Freq: Once | INTRAVENOUS | Status: AC
  Administered 2017-08-09: 0.4 mg via INTRAVENOUS

## 2017-08-09 MED ORDER — TECHNETIUM TC 99M TETROFOSMIN IV KIT
30.0000 | PACK | Freq: Once | INTRAVENOUS | Status: AC | PRN
  Administered 2017-08-09: 31.22 via INTRAVENOUS

## 2017-08-10 ENCOUNTER — Telehealth: Payer: Self-pay | Admitting: Cardiovascular Disease

## 2017-08-10 NOTE — Telephone Encounter (Signed)
S/w patient. She says she still gets the pain in her chest to her back when she gets upset and stressed out. I reassured her that the results of stress test were normal. She verbalized understanding to continue with plan for echo on Thursday. Pt verbalized understanding to call 911 or go to the emergency room, if he develops any new or worsening symptoms.

## 2017-08-10 NOTE — Telephone Encounter (Signed)
Patient would like to discuss results of stress test again  Patient states that when she gets upset sometimes she experiences chest pain  Please call to discuss

## 2017-08-12 ENCOUNTER — Other Ambulatory Visit: Payer: Self-pay

## 2017-08-12 ENCOUNTER — Ambulatory Visit (INDEPENDENT_AMBULATORY_CARE_PROVIDER_SITE_OTHER): Payer: Medicare Other

## 2017-08-12 DIAGNOSIS — R079 Chest pain, unspecified: Secondary | ICD-10-CM | POA: Diagnosis not present

## 2017-08-12 DIAGNOSIS — J9610 Chronic respiratory failure, unspecified whether with hypoxia or hypercapnia: Secondary | ICD-10-CM

## 2017-08-24 ENCOUNTER — Telehealth: Payer: Self-pay | Admitting: Cardiovascular Disease

## 2017-08-24 MED ORDER — APIXABAN 5 MG PO TABS: 5.0000 mg | ORAL_TABLET | Freq: Two times a day (BID) | ORAL | 3 refills | Status: DC

## 2017-08-24 MED ORDER — FLECAINIDE ACETATE 50 MG PO TABS: 50.0000 mg | ORAL_TABLET | Freq: Two times a day (BID) | ORAL | 3 refills | Status: DC

## 2017-08-24 NOTE — Telephone Encounter (Signed)
Pill Pack

## 2017-08-24 NOTE — Telephone Encounter (Signed)
Rx sent to [facility] for Flecainide and Eliquis for 30 day supply.

## 2017-08-24 NOTE — Telephone Encounter (Signed)
Pt calling  Asking [place] to please send in refills so they can do a pill box for her. She is asking for [place] to send in Refill for Eliquis and Flecainide  [phone], She states its the pill pack place.   She thought [facility] did this but they do not.  Please advise

## 2017-08-25 ENCOUNTER — Other Ambulatory Visit: Payer: Self-pay

## 2017-08-26 ENCOUNTER — Other Ambulatory Visit: Payer: Self-pay | Admitting: Nurse Practitioner

## 2017-08-30 ENCOUNTER — Other Ambulatory Visit: Payer: Self-pay | Admitting: Cardiovascular Disease

## 2017-09-08 ENCOUNTER — Encounter: Payer: Self-pay | Admitting: Nurse Practitioner

## 2017-09-14 ENCOUNTER — Encounter: Payer: Self-pay | Admitting: Nurse Practitioner

## 2017-09-14 ENCOUNTER — Ambulatory Visit: Payer: Medicare Other | Admitting: Nurse Practitioner

## 2017-09-14 VITALS — BP 140/82 | HR 84 | Resp 16 | Ht 60.0 in | Wt 180.2 lb

## 2017-09-14 DIAGNOSIS — I10 Essential (primary) hypertension: Secondary | ICD-10-CM | POA: Diagnosis not present

## 2017-09-14 DIAGNOSIS — F331 Major depressive disorder, recurrent, moderate: Secondary | ICD-10-CM | POA: Diagnosis not present

## 2017-09-14 DIAGNOSIS — F411 Generalized anxiety disorder: Secondary | ICD-10-CM | POA: Diagnosis not present

## 2017-09-14 DIAGNOSIS — I4891 Unspecified atrial fibrillation: Secondary | ICD-10-CM

## 2017-09-14 DIAGNOSIS — J449 Chronic obstructive pulmonary disease, unspecified: Secondary | ICD-10-CM | POA: Diagnosis not present

## 2017-09-14 NOTE — Progress Notes (Signed)
[facility] [place],  [id number]  Internal MEDICINE  Office Visit Note  Patient Name: [name]  [id number]  [id number]  Date of Service: 09/14/2017     Complaints/HPI Pt is here for routine follow up.  The patient is here as routine follow up. She is having discomfort and tenderness in her neck, shoulders, and both arms. Has some arthritis and carpal tunnel in her hands. Unable to take NSAIDs as they worsen her acid reflux symptoms.  The patient feels like increased stress may be causing her new symptoms. Her daughter, who drinks heavily and uses illicit drugs, is about to have her third baby. Her daughter is going to have labor induced this coming Saturday.  The patient is concerned about being     Current Medication: Outpatient Encounter Medications as of 09/14/2017  Medication Sig  . acetaminophen (TYLENOL) 500 MG tablet Take 500 mg by mouth 2 (two) times daily as needed for moderate pain or headache.  . albuterol (PROVENTIL HFA;VENTOLIN HFA) 108 (90 Base) MCG/ACT inhaler Inhale 2 puffs into the lungs every 6 (six) hours as needed for wheezing or shortness of breath.  [name] albuterol-ipratropium (COMBIVENT) 18-103 MCG/ACT inhaler Inhale 1 puff into the lungs 3 (three) times daily as needed for wheezing or shortness of breath.  . ALPRAZolam (XANAX) 0.25 MG tablet Take 0.5 mg by mouth at bedtime as needed for anxiety.   [name] apixaban (ELIQUIS) 5 MG TABS tablet Take 1 tablet (5 mg total) by mouth 2 (two) times daily.  [name] atorvastatin (LIPITOR) 40 MG tablet Take 40 mg by mouth at bedtime.   . Coenzyme Q10 (COQ10) 200 MG CAPS Take 200 mg by mouth daily.  . DULoxetine (CYMBALTA) 60 MG capsule Take 60 mg by mouth 2 (two) times daily.   . flecainide (TAMBOCOR) 50 MG tablet TAKE 1 TABLET BY MOUTH TWICE A DAY  . Ketotifen Fumarate (ALAWAY OP) Apply 1 drop to eye daily as needed (allergies).  [name] levothyroxine (SYNTHROID, LEVOTHROID) 25 MCG tablet Take 25 mcg by  mouth daily before breakfast.  . lisinopril-hydrochlorothiazide (PRINZIDE,ZESTORETIC) 20-25 MG tablet Take 1 tablet by mouth daily.  [name] loratadine (CLARITIN) 10 MG tablet Take 10 mg by mouth daily.  . mometasone (ASMANEX) 220 MCG/INH inhaler Inhale 1 puff into the lungs 2 (two) times daily.  . pantoprazole (PROTONIX) 40 MG tablet Take 40 mg by mouth daily.   . potassium chloride (K-DUR,KLOR-CON) 10 MEQ tablet Take 10 mEq by mouth 3 (three) times daily.   . sodium chloride (OCEAN) 0.65 % SOLN nasal spray Place 1 spray into both nostrils daily as needed for congestion.  . traZODone (DESYREL) 50 MG tablet   . diltiazem (CARDIZEM CD) 120 MG 24 hr capsule Take 1 capsule (120 mg total) by mouth daily. (Patient not taking: Reported on 09/14/2017)  . ferrous sulfate (FERROUSUL) 325 (65 FE) MG tablet Take 325 mg by mouth daily with breakfast.  . flecainide (TAMBOCOR) 50 MG tablet Take 1 tablet (50 mg total) by mouth 2 (two) times daily. (Patient not taking: Reported on 09/14/2017)   No facility-administered encounter medications on file as of 09/14/2017.     Surgical History: Past Surgical History:  Procedure Laterality Date  . ABDOMINAL AORTIC ANEURYSM REPAIR  2008  . ABDOMINAL HYSTERECTOMY    . AORTIC VALVE REPLACEMENT  2008  . CARDIAC CATHETERIZATION     [facility]  . CARPAL TUNNEL RELEASE    . ELECTROPHYSIOLOGIC STUDY N/A 08/17/2016   Procedure: Cardioversion;  Surgeon: [name], MD;  Location: [facility] ORS;  Service: Cardiovascular;  Laterality: N/A;  . TUMOR EXCISION Left    x3 (arm)    Medical History: Past Medical History:  Diagnosis Date  . 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 s/p bioprosthetic valve replacement 2008 at [facility];  b. 01/2015 Echo: EF 60-65%, no rwma, Gr1 DD, mildly dil LA, nl RV fxn.  . Atrial flutter ([place])    a. 08/2016 s/p DCCV.  Remains on flecainide 50 mg bid.  . Basal skull fracture ([place]) 20 yrs ago  . Chronic respiratory failure ([place])   . COPD (chronic  obstructive pulmonary disease) ([place])    a. on home O2 at 2L since 2008  . 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 History of cardiac cath    a. 2008 prior to Aortic aneurysm repair-->nl cors.  . History of stress test    a. 10/2015 MV: no ischemia/infarct.  [name] HLD (hyperlipidemia)   . HTN (hypertension)   . Hypothyroidism   . Obesity   . PAF (paroxysmal atrial fibrillation) ([name])    a. on Eliquis; b. CHADS2VASc = 3 (HTN, age x 1, female).  . S/P ascending aortic aneurysm repair 2008    Family History: Family History  Problem Relation Age of Onset  . Stroke Father   . Stroke Paternal Grandmother     Social History   Socioeconomic History  . Marital status: Divorced    Spouse name: Not on file  . Number of children: Not on file  . Years of education: Not on file  . Highest education level: Not on file  Social Needs  . Financial resource strain: Not on file  . Food insecurity - worry: Not on file  . Food insecurity - inability: Not on file  . Transportation needs - medical: Not on file  . Transportation needs - non-medical: Not on file  Occupational History  . Not on file  Tobacco Use  . Smoking status: Former Research scientist (life sciences)  . Smokeless tobacco: Never Used  Substance and Sexual Activity  . Alcohol use: No  . Drug use: No  . Sexual activity: No    Birth control/protection: Surgical  Other Topics Concern  . Not on file  Social History Narrative  . Not on file      Review of Systems  Constitutional: Positive for fatigue.  HENT: Negative.   Respiratory: Positive for shortness of breath.   Cardiovascular: Positive for chest pain and palpitations.  Gastrointestinal: Negative for diarrhea, nausea, rectal pain and vomiting.  Endocrine:       Thyroid problems   Genitourinary: Negative.   Musculoskeletal: Positive for arthralgias, back pain, myalgias and neck pain.  Skin: Negative.   Allergic/Immunologic: Negative.   Neurological: Positive for  headaches.  Hematological: Negative.   Psychiatric/Behavioral: Positive for agitation. The patient is nervous/anxious.     Today's Vitals   09/14/17 1422  BP: 140/82  Pulse: 84  Resp: 16  SpO2: 97%  Weight: 180 lb 3.2 oz (81.7 kg)  Height: 5' (1.524 m)    Physical Exam  Constitutional: She is oriented to person, place, and time. She appears well-developed and well-nourished.  HENT:  Head: Normocephalic and atraumatic.  Eyes: Pupils are equal, round, and reactive to light.  Neck: Normal range of motion. Neck supple. No thyromegaly present.  Cardiovascular: Normal rate and regular rhythm.  Pulmonary/Chest: Effort normal. She has decreased breath  sounds. She has no rhonchi. She has no rales.  The patient is on nsal cannula oxygen at all times.   Abdominal: Soft. Bowel sounds are normal. There is no tenderness.  Musculoskeletal: Normal range of motion.  Lymphadenopathy:    She has no cervical adenopathy.  Neurological: She is alert and oriented to person, place, and time.  Skin: Skin is warm and dry.  Psychiatric: Her speech is normal and behavior is normal. Judgment and thought content normal. Her mood appears anxious. Cognition and memory are normal. She exhibits a depressed mood.  Nursing note and vitals reviewed.    Assessment/Plan:    ICD-10-CM   1. Chronic obstructive pulmonary disease, unspecified COPD type ([place]) J44.9   2. Atrial fibrillation with RVR ([name]) I48.91   3. Essential hypertension I10   4. Moderate episode of recurrent major depressive disorder ([name]) F33.1   5. GAD (generalized anxiety disorder) F41.1    1. COPD table. Continue inhaalers and respiratory medications as prescribed. Use nasal cannula oxygen as prescribed 2. Continue regular visits with cardiology for management of atrial fib. 3. bp stable. Continue bp medication as prescribed  4. Multiple family stressors present. Continue duloxetine 60mg  twice daily 5. May use alprazoam 0.25mg  twice daily  if needed for acute anxiety  General Counseling: I have discussed the findings of the evaluation and examination with [name].  I have also discussed any further diagnostic evaluation that may be needed or ordered today. Riko verbalizes understanding of the findings of todays visit. We also reviewed her medications today. she has been encouraged to call the office with any questions or concerns that should arise related to todays visit.  This patient was seen by Leretha Pol, FNP- C in Collaboration with Dr [name] as a part of collaborative care agreement    Time spent:20 minutes    Dr [name] Internal medicine

## 2017-09-17 ENCOUNTER — Other Ambulatory Visit: Payer: Self-pay

## 2017-09-17 MED ORDER — DICLOFENAC SODIUM 1 % TD GEL: TRANSDERMAL | 3 refills | Status: DC

## 2017-09-19 DIAGNOSIS — J449 Chronic obstructive pulmonary disease, unspecified: Secondary | ICD-10-CM | POA: Insufficient documentation

## 2017-09-20 ENCOUNTER — Other Ambulatory Visit: Payer: Self-pay

## 2017-09-20 MED ORDER — LISINOPRIL-HYDROCHLOROTHIAZIDE 20-25 MG PO TABS: 1.0000 | ORAL_TABLET | Freq: Every day | ORAL | 1 refills | Status: DC

## 2017-09-20 MED ORDER — DULOXETINE HCL 60 MG PO CPEP: 60.0000 mg | ORAL_CAPSULE | Freq: Two times a day (BID) | ORAL | 1 refills | Status: DC

## 2017-09-20 MED ORDER — DULOXETINE HCL 60 MG PO CPEP: 60.0000 mg | ORAL_CAPSULE | Freq: Two times a day (BID) | ORAL | 3 refills | Status: DC

## 2017-09-21 ENCOUNTER — Other Ambulatory Visit: Payer: Self-pay | Admitting: Cardiovascular Disease

## 2017-09-21 ENCOUNTER — Other Ambulatory Visit: Payer: Self-pay

## 2017-09-21 MED ORDER — LISINOPRIL-HYDROCHLOROTHIAZIDE 20-25 MG PO TABS: 1.0000 | ORAL_TABLET | Freq: Every day | ORAL | 0 refills | Status: DC

## 2017-10-07 DIAGNOSIS — Z23 Encounter for immunization: Secondary | ICD-10-CM | POA: Diagnosis not present

## 2017-10-11 ENCOUNTER — Other Ambulatory Visit: Payer: Self-pay

## 2017-10-11 DIAGNOSIS — H2513 Age-related nuclear cataract, bilateral: Secondary | ICD-10-CM | POA: Diagnosis not present

## 2017-10-11 MED ORDER — TRAZODONE HCL 50 MG PO TABS: 50.0000 mg | ORAL_TABLET | Freq: Every day | ORAL | 3 refills | Status: DC

## 2017-10-12 ENCOUNTER — Other Ambulatory Visit: Payer: Self-pay

## 2017-10-12 MED ORDER — TRAZODONE HCL 50 MG PO TABS: 50.0000 mg | ORAL_TABLET | Freq: Every day | ORAL | 1 refills | Status: DC

## 2017-10-22 DIAGNOSIS — G4733 Obstructive sleep apnea (adult) (pediatric): Secondary | ICD-10-CM | POA: Diagnosis not present

## 2017-11-04 ENCOUNTER — Encounter: Payer: Self-pay | Admitting: Cardiovascular Disease

## 2017-11-04 ENCOUNTER — Ambulatory Visit: Payer: Medicare Other | Admitting: Cardiovascular Disease

## 2017-11-04 VITALS — BP 110/58 | HR 78 | Ht 60.0 in | Wt 181.0 lb

## 2017-11-04 DIAGNOSIS — I4892 Unspecified atrial flutter: Secondary | ICD-10-CM | POA: Diagnosis not present

## 2017-11-04 DIAGNOSIS — R079 Chest pain, unspecified: Secondary | ICD-10-CM | POA: Diagnosis not present

## 2017-11-04 DIAGNOSIS — I10 Essential (primary) hypertension: Secondary | ICD-10-CM

## 2017-11-04 DIAGNOSIS — Z952 Presence of prosthetic heart valve: Secondary | ICD-10-CM

## 2017-11-04 NOTE — Progress Notes (Signed)
Cardiology Office Note   Date:  11/04/2017   ID:  [name], DOB 02/15/1946, MRN [id number]  PCP:  [name], MD  Cardiologist:   [name], MD   Chief Complaint  Patient presents with  . Other    2-3 month follow up. Patient c/o SOB and chest pain. Meds reviewed verbally with patient.       History of Present Illness: [name] is a 72 y.o. female who presents for A follow-up visit regarding persistent atrial fibrillation/flutter and valvular heart disease. 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  Cardiac catheterization before valve replacement showed no obstructive coronary artery disease.  She was seen recently for atypical chest pain and worsening dyspnea.  She was under significant stress at home.  She underwent a nuclear stress test which was normal.  Echocardiogram showed normal LV systolic function with mild mitral regurgitation no significant pulmonary hypertension.  She has been doing reasonably well overall with no further chest pain.  Shortness of breath is stable.  No palpitations.  Stress level has improved at home.  Past Medical History:  Diagnosis Date  . 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 s/p bioprosthetic valve replacement 2008 at [facility];  b. 01/2015 Echo: EF 60-65%, no rwma, Gr1 DD, mildly dil LA, nl RV fxn.  . Atrial flutter ([place])    a. 08/2016 s/p DCCV.  Remains on flecainide 50 mg bid.  . Basal skull fracture ([place]) 20 yrs ago  . Chronic respiratory failure ([place])   . COPD (chronic obstructive pulmonary disease) ([place])    a. on home O2 at 2L since 2008  . 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 History of cardiac cath    a. 2008 prior to Aortic aneurysm  repair-->nl cors.  . History of stress test    a. 10/2015 MV: no ischemia/infarct.  [name] HLD (hyperlipidemia)   . HTN (hypertension)   . Hypothyroidism   . Obesity   . PAF (paroxysmal atrial fibrillation) ([name])    a. on Eliquis; b. CHADS2VASc = 3 (HTN, age x 1, female).  . S/P ascending aortic aneurysm repair 2008    Past Surgical History:  Procedure Laterality Date  . ABDOMINAL AORTIC ANEURYSM REPAIR  2008  . ABDOMINAL HYSTERECTOMY    . AORTIC VALVE REPLACEMENT  2008  . CARDIAC CATHETERIZATION     [facility]  . CARPAL TUNNEL RELEASE    . ELECTROPHYSIOLOGIC STUDY N/A 08/17/2016   Procedure: Cardioversion;  Surgeon: [name], MD;  Location: [facility] ORS;  Service: Cardiovascular;  Laterality: N/A;  . TUMOR EXCISION Left    x3 (arm)     Current Outpatient Medications  Medication Sig Dispense Refill  . acetaminophen (TYLENOL) 500 MG tablet Take 500 mg by mouth 2 (two) times daily as needed for moderate pain or headache.    . albuterol (PROVENTIL HFA;VENTOLIN HFA) 108 (90 Base) MCG/ACT inhaler Inhale 2 puffs into the lungs every 6 (six) hours as needed for wheezing or shortness of breath.    [name] albuterol-ipratropium (COMBIVENT) 18-103 MCG/ACT inhaler Inhale 1 puff into the lungs 3 (three) times daily as needed for wheezing or shortness of breath.    [name]  ALPRAZolam (XANAX) 0.25 MG tablet Take 0.5 mg by mouth at bedtime as needed for anxiety.     [name] apixaban (ELIQUIS) 5 MG TABS tablet Take 1 tablet (5 mg total) by mouth 2 (two) times daily. 60 tablet 3  . atorvastatin (LIPITOR) 40 MG tablet Take 40 mg by mouth at bedtime.     . Coenzyme Q10 (COQ10) 200 MG CAPS Take 200 mg by mouth daily.    . diclofenac sodium (VOLTAREN) 1 % GEL To the affected joint 100 Tube 3  . diltiazem (CARDIZEM CD) 120 MG 24 hr capsule Take 1 capsule (120 mg total) by mouth daily. 90 capsule 3  . DULoxetine (CYMBALTA) 60 MG capsule Take 1 capsule (60 mg total) by mouth 2 (two) times daily. 180 capsule 1  . ferrous  sulfate (FERROUSUL) 325 (65 FE) MG tablet Take 325 mg by mouth daily with breakfast.    . flecainide (TAMBOCOR) 50 MG tablet Take 1 tablet (50 mg total) by mouth 2 (two) times daily. 60 tablet 3  . flecainide (TAMBOCOR) 50 MG tablet TAKE 1 TABLET BY MOUTH TWICE A DAY 60 tablet 6  . Ketotifen Fumarate (ALAWAY OP) Apply 1 drop to eye daily as needed (allergies).    [name] levothyroxine (SYNTHROID, LEVOTHROID) 25 MCG tablet Take 25 mcg by mouth daily before breakfast.    . lisinopril-hydrochlorothiazide (PRINZIDE,ZESTORETIC) 20-25 MG tablet Take 1 tablet by mouth daily. 90 tablet 0  . loratadine (CLARITIN) 10 MG tablet Take 10 mg by mouth daily.    . mometasone (ASMANEX) 220 MCG/INH inhaler Inhale 1 puff into the lungs 2 (two) times daily.    . pantoprazole (PROTONIX) 40 MG tablet Take 40 mg by mouth daily.   4  . potassium chloride (K-DUR,KLOR-CON) 10 MEQ tablet Take 10 mEq by mouth 3 (three) times daily.     . sodium chloride (OCEAN) 0.65 % SOLN nasal spray Place 1 spray into both nostrils daily as needed for congestion.    . traZODone (DESYREL) 50 MG tablet Take 1 tablet (50 mg total) by mouth at bedtime. 90 tablet 1   No current facility-administered medications for this visit.     Allergies:   Levaquin [levofloxacin in d5w]; Levofloxacin; Soy allergy; Benadryl [diphenhydramine hcl (sleep)]; Cetirizine; Diphenhydramine-zinc acetate; Lasix [furosemide]; Meloxicam; and Sulfa antibiotics    Social History:  The patient  reports that she has quit smoking. she has never used smokeless tobacco. She reports that she does not drink alcohol or use drugs.   Family History:  The patient's family history includes Stroke in her father and paternal grandmother.    ROS:  Please see the history of present illness.   Otherwise, review of systems are positive for none.   All other systems are reviewed and negative.    PHYSICAL EXAM: VS:  BP (!) 110/58 (BP Location: Left Arm, Patient Position: Sitting, Cuff  Size: Large)   Pulse 78   Ht 5' (1.524 m)   Wt 181 lb (82.1 kg)   BMI 35.35 kg/m  , BMI Body mass index is 35.35 kg/m. GEN: Well nourished, well developed, in no acute distress  HEENT: normal  Neck: no JVD, carotid bruits, or masses Cardiac: RRR; no  rubs, or gallops,no edema . There  is a 2/6 systolic ejection murmur in the aortic area.  Respiratory:  clear to auscultation bilaterally, normal work of breathing GI: soft, nontender, nondistended, + BS MS: no deformity or atrophy  Skin: warm and dry, no rash Neuro:  Strength  and sensation are intact Psych: euthymic mood, full affect   EKG:  EKG is ordered today. EKG showed normal sinus rhythm with LVH with repolarization abnormalities.  Recent Labs: 01/22/2017: BUN 14; Creatinine, Ser 0.72; Hemoglobin 11.9; Platelets 273; Potassium 3.6; Sodium 134    Lipid Panel No results found for: CHOL, TRIG, HDL, CHOLHDL, VLDL, LDLCALC, LDLDIRECT    Wt Readings from Last 3 Encounters:  11/04/17 181 lb (82.1 kg)  09/14/17 180 lb 3.2 oz (81.7 kg)  08/03/17 181 lb (82.1 kg)         ASSESSMENT AND PLAN:  1.  Paroxysmal Atrial flutter:  She is maintaining in sinus rhythm with flecainide. Continue long-term anticoagulation with Eliquis.  She does have LVH and ventricular septum was 17 mm.  Thus, at some point we might have to switch her from flecainide.  However, I do not see very good alternatives especially with her underlying lung disease.  We can monitor closely.  2. Essential hypertension: Blood pressure is well controlled on current medications.  3. History of aortic valve replacement with bioprosthetic valve: This was functioning normally on recent echo.  4.  Recurrent chest pain in setting of stress and anxiety.  Stress test was normal.   Disposition:   FU with me in 6 months  Signed,  [name], MD  11/04/2017 4:58 PM    [place]

## 2017-11-04 NOTE — Patient Instructions (Signed)
Medication Instructions: Continue same medications.   Labwork: None.   Procedures/Testing: None.   Follow-Up: 6 months with Dr. Arida.   Any Additional Special Instructions Will Be Listed Below (If Applicable).     If you need a refill on your cardiac medications before your next appointment, please call your pharmacy.   

## 2017-11-10 ENCOUNTER — Ambulatory Visit: Payer: Medicare Other

## 2017-11-10 DIAGNOSIS — G4733 Obstructive sleep apnea (adult) (pediatric): Secondary | ICD-10-CM

## 2017-11-10 NOTE — Progress Notes (Signed)
95 percentile pressure 8   95th percentile leak 8.0   apnea index 0.6 /hr  apnea-hypopnea index  0.8 /hr   total days used  >4 hr 179 days  total days used <4 hr 0 days  Total compliance 99 percent  No problems or questions at this time

## 2017-11-11 ENCOUNTER — Other Ambulatory Visit: Payer: Self-pay | Admitting: Cardiovascular Disease

## 2017-11-11 MED ORDER — FLECAINIDE ACETATE 50 MG PO TABS: 50.0000 mg | ORAL_TABLET | Freq: Two times a day (BID) | ORAL | 3 refills | Status: DC

## 2017-11-11 NOTE — Telephone Encounter (Signed)
Refill Request.  

## 2017-11-12 ENCOUNTER — Other Ambulatory Visit: Payer: Self-pay | Admitting: Cardiovascular Disease

## 2017-11-16 ENCOUNTER — Encounter: Payer: Self-pay | Admitting: Internal Medicine

## 2017-11-16 ENCOUNTER — Ambulatory Visit: Payer: Medicare Other | Admitting: Internal Medicine

## 2017-11-16 ENCOUNTER — Telehealth: Payer: Self-pay

## 2017-11-16 VITALS — BP 140/80 | HR 72 | Resp 16 | Ht 60.0 in | Wt 181.0 lb

## 2017-11-16 DIAGNOSIS — J9611 Chronic respiratory failure with hypoxia: Secondary | ICD-10-CM | POA: Diagnosis not present

## 2017-11-16 DIAGNOSIS — R0602 Shortness of breath: Secondary | ICD-10-CM

## 2017-11-16 DIAGNOSIS — J449 Chronic obstructive pulmonary disease, unspecified: Secondary | ICD-10-CM | POA: Diagnosis not present

## 2017-11-16 NOTE — Progress Notes (Signed)
[facility], [place] [address]  Pulmonary Sleep Medicine  Office Visit Note  Patient Name: [name] DOB: [birth year] MRN [id number]  Date of Service: 11/16/2017  Complaints/HPI:   She is doing fairly well continues to use her oxygen as prescribed.  Currently she is on 2 L.  She has minimal to no cough at this time.  Denies any chest pain.  She had a spirometry done today which was reviewed.  She has had no admissions to the hospital.  Denies having any cough or congestion no fevers or chills.  ROS  General: (-) fever, (-) chills, (-) night sweats, (-) weakness Skin: (-) rashes, (-) itching,. Eyes: (-) visual changes, (-) redness, (-) itching. Nose and Sinuses: (-) nasal stuffiness or itchiness, (-) postnasal drip, (-) nosebleeds, (-) sinus trouble. Mouth and Throat: (-) sore throat, (-) hoarseness. Neck: (-) swollen glands, (-) enlarged thyroid, (-) neck pain. Respiratory: - cough, (-) bloody sputum, + shortness of breath, - wheezing. Cardiovascular: - ankle swelling, (-) chest pain. Lymphatic: (-) lymph node enlargement. Neurologic: (-) numbness, (-) tingling. Psychiatric: (-) anxiety, (-) depression   Current Medication: Outpatient Encounter Medications as of 11/16/2017  Medication Sig  . acetaminophen (TYLENOL) 500 MG tablet Take 500 mg by mouth 2 (two) times daily as needed for moderate pain or headache.  . albuterol (PROVENTIL HFA;VENTOLIN HFA) 108 (90 Base) MCG/ACT inhaler Inhale 2 puffs into the lungs every 6 (six) hours as needed for wheezing or shortness of breath.  [name] albuterol-ipratropium (COMBIVENT) 18-103 MCG/ACT inhaler Inhale 1 puff into the lungs 3 (three) times daily as needed for wheezing or shortness of breath.  . ALPRAZolam (XANAX) 0.25 MG tablet Take 0.5 mg by mouth at bedtime as needed for anxiety.   [name] atorvastatin (LIPITOR) 40 MG tablet Take 40 mg by mouth at bedtime.   . Coenzyme Q10 (COQ10) 200 MG CAPS Take 200 mg by  mouth daily.  . diclofenac sodium (VOLTAREN) 1 % GEL To the affected joint  . diltiazem (CARDIZEM CD) 120 MG 24 hr capsule Take 1 capsule (120 mg total) by mouth daily.  . DULoxetine (CYMBALTA) 60 MG capsule Take 1 capsule (60 mg total) by mouth 2 (two) times daily.  [name] ELIQUIS 5 MG TABS tablet Take 1 tablet (5 mg total) by mouth 2 (two) times daily.  . ferrous sulfate (FERROUSUL) 325 (65 FE) MG tablet Take 325 mg by mouth daily with breakfast.  . flecainide (TAMBOCOR) 50 MG tablet Take 1 tablet (50 mg total) by mouth 2 (two) times daily.  [name] Ketotifen Fumarate (ALAWAY OP) Apply 1 drop to eye daily as needed (allergies).  [name] levothyroxine (SYNTHROID, LEVOTHROID) 25 MCG tablet Take 25 mcg by mouth daily before breakfast.  . lisinopril-hydrochlorothiazide (PRINZIDE,ZESTORETIC) 20-25 MG tablet Take 1 tablet by mouth daily.  [name] loratadine (CLARITIN) 10 MG tablet Take 10 mg by mouth daily.  . mometasone (ASMANEX) 220 MCG/INH inhaler Inhale 1 puff into the lungs 2 (two) times daily.  . pantoprazole (PROTONIX) 40 MG tablet Take 40 mg by mouth daily.   . potassium chloride (K-DUR,KLOR-CON) 10 MEQ tablet Take 10 mEq by mouth 3 (three) times daily.   . sodium chloride (OCEAN) 0.65 % SOLN nasal spray Place 1 spray into both nostrils daily as needed for congestion.  . traZODone (DESYREL) 50 MG tablet Take 1 tablet (50 mg total) by mouth at bedtime.   No facility-administered encounter medications on file as of 11/16/2017.     Surgical  History: Past Surgical History:  Procedure Laterality Date  . ABDOMINAL AORTIC ANEURYSM REPAIR  2008  . ABDOMINAL HYSTERECTOMY    . AORTIC VALVE REPLACEMENT  2008  . CARDIAC CATHETERIZATION     [facility]  . CARPAL TUNNEL RELEASE    . ELECTROPHYSIOLOGIC STUDY N/A 08/17/2016   Procedure: Cardioversion;  Surgeon: [name], MD;  Location: [facility] ORS;  Service: Cardiovascular;  Laterality: N/A;  . TUMOR EXCISION Left    x3 (arm)    Medical History: Past Medical History:   Diagnosis Date  . 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 s/p bioprosthetic valve replacement 2008 at [facility];  b. 01/2015 Echo: EF 60-65%, no rwma, Gr1 DD, mildly dil LA, nl RV fxn.  . Atrial flutter ([place])    a. 08/2016 s/p DCCV.  Remains on flecainide 50 mg bid.  . Basal skull fracture ([place]) 20 yrs ago  . Chronic respiratory failure ([place])   . COPD (chronic obstructive pulmonary disease) ([place])    a. on home O2 at 2L since 2008  . 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 History of cardiac cath    a. 2008 prior to Aortic aneurysm repair-->nl cors.  . History of stress test    a. 10/2015 MV: no ischemia/infarct.  [name] HLD (hyperlipidemia)   . HTN (hypertension)   . Hypothyroidism   . Obesity   . PAF (paroxysmal atrial fibrillation) ([name])    a. on Eliquis; b. CHADS2VASc = 3 (HTN, age x 1, female).  . S/P ascending aortic aneurysm repair 2008    Family History: Family History  Problem Relation Age of Onset  . Stroke Father   . Stroke Paternal Grandmother     Social History: Social History   Socioeconomic History  . Marital status: Divorced    Spouse name: Not on file  . Number of children: Not on file  . Years of education: Not on file  . Highest education level: Not on file  Social Needs  . Financial resource strain: Not on file  . Food insecurity - worry: Not on file  . Food insecurity - inability: Not on file  . Transportation needs - medical: Not on file  . Transportation needs - non-medical: Not on file  Occupational History  . Not on file  Tobacco Use  . Smoking status: Former Research scientist (life sciences)  . Smokeless tobacco: Never Used  Substance and Sexual Activity  . Alcohol use: No  . Drug use: No  . Sexual activity: No    Birth control/protection: Surgical  Other Topics Concern  . Not on file  Social History Narrative  . Not on file    Vital Signs: Blood pressure 140/80, pulse 72, resp. rate 16, height 5' (1.524 m), weight 181 lb (82.1 kg),  SpO2 96 %.  Examination: General Appearance: The patient is well-developed, well-nourished, and in no distress. Skin: Gross inspection of skin unremarkable. Head: normocephalic, no gross deformities. Eyes: no gross deformities noted. ENT: ears appear grossly normal no exudates. Neck: Supple. No thyromegaly. No LAD. Respiratory:   No rhonchi or rales expansion is equal. Cardiovascular: Normal S1 and S2 without murmur or rub. Extremities: No cyanosis. pulses are equal. Neurologic: Alert and oriented. No involuntary movements.  LABS: No results found for this or any previous visit (from the past 2160 hour(s)).  Radiology: Nm Myocar Multi W/spect W/wall Motion / Ef  Result Date: 08/09/2017  The study is normal.  This is a low risk study.  The left ventricular ejection fraction is normal (55-65%).     No results found.  No results found.    Assessment and Plan: Patient Active Problem List   Diagnosis Date Noted  . COPD (chronic obstructive pulmonary disease) ([place]) 09/19/2017  . Typical atrial flutter ([place])   . Essential hypertension 01/24/2015  . Paroxysmal atrial fibrillation ([place]) 01/24/2015  . History of aortic valve replacement with bioprosthetic valve 01/24/2015  . Atrial fibrillation with RVR ([place]) 01/11/2015    1.  COPD She will continue with present therapy as orderedShe has been doing well overall will be continued on present inhaler regimen.  Medications are up-to-date at this time. 2. Chronic respiratory failure with hypoxia  We will continue with oxygen therapy currently she is on 2 L 3. Oxygen dependence  As above on 2 L nasal cannula 4. Chronic atrial fibrillation rate is controlled we will continue to follow up [place]: I have discussed the findings of the evaluation and examination with Dmiyah.  I have also discussed any further diagnostic evaluation thatmay be needed or ordered today. Dwayne verbalizes understanding of the findings of  todays visit. We also reviewed her medications today and discussed drug interactions and side effects including but not limited excessive drowsiness and altered mental states. We also discussed that there is always a risk not just to her but also people around her. she has been encouraged to call the office with any questions or concerns that should arise related to todays visit.    Time spent: 105min  I have personally obtained a history, examined the patient, evaluated laboratory and imaging results, formulated the assessment and plan and placed orders.    Allyne Gee, MD [facility] Pulmonary and Critical Care Sleep medicine

## 2017-11-16 NOTE — Patient Instructions (Signed)

## 2017-11-18 DIAGNOSIS — H90A21 Sensorineural hearing loss, unilateral, right ear, with restricted hearing on the contralateral side: Secondary | ICD-10-CM | POA: Diagnosis not present

## 2017-11-18 NOTE — Telephone Encounter (Signed)
error 

## 2017-11-22 ENCOUNTER — Telehealth: Payer: Self-pay

## 2017-11-22 ENCOUNTER — Other Ambulatory Visit: Payer: Self-pay | Admitting: Nurse Practitioner

## 2017-11-22 DIAGNOSIS — F5101 Primary insomnia: Secondary | ICD-10-CM

## 2017-11-22 MED ORDER — TRAZODONE HCL 50 MG PO TABS
100.0000 mg | ORAL_TABLET | Freq: Every day | ORAL | 1 refills | Status: DC
Start: 2017-11-22 — End: 2018-04-08

## 2017-11-22 NOTE — Telephone Encounter (Signed)
Adjusted rx for trazodone 50mg , as patient generally taking 2 tablets every evening. New rx sent to pill pack.  Please let herknow she can take 2 tablets and new pill pack should be taken care of soon. thanks

## 2017-11-22 NOTE — Progress Notes (Signed)
Adjusted rx for trazodone 50mg , as patient generally taking 2 tablets every evening. New rx sent to pill pack.

## 2017-11-23 NOTE — Telephone Encounter (Signed)
Left vm that she can take 2 tablets of trazodone and that new rx was sent to pill pack.  dbs

## 2017-11-26 ENCOUNTER — Telehealth: Payer: Self-pay | Admitting: Cardiovascular Disease

## 2017-11-26 NOTE — Telephone Encounter (Signed)
Hold Eliquis 2 days before extraction but no need to hold before cleaning.

## 2017-11-26 NOTE — Telephone Encounter (Signed)
Patient needs a root canal verses pulling the tooth.  Doesn't know what to do because its so expensive to have all this dental work done. She needs to know how long to hold her Eliquis prior to the root canal or extraction of the tooth and if its ok to do so. Dentist at [facility].

## 2017-11-26 NOTE — Telephone Encounter (Signed)
Pt states she needs some dental work and is on a blood thinner. Please call to discuss.

## 2017-11-29 NOTE — Telephone Encounter (Signed)
Called patient and she verbalized understanding to hold Eliquis 2 days before the extraction but not needed before a cleaning.

## 2017-11-29 NOTE — Telephone Encounter (Signed)
No answer. Left message to call back.   

## 2017-11-29 NOTE — Telephone Encounter (Signed)
Pt returning our call °Please call back ° °

## 2017-12-22 DIAGNOSIS — G4733 Obstructive sleep apnea (adult) (pediatric): Secondary | ICD-10-CM | POA: Diagnosis not present

## 2017-12-29 ENCOUNTER — Telehealth: Payer: Self-pay | Admitting: Internal Medicine

## 2017-12-29 NOTE — Telephone Encounter (Signed)
Left message and asked pt to call and schedule face to face appt with DSK with a 6 min walk to recert for her O2. Beth

## 2017-12-31 ENCOUNTER — Other Ambulatory Visit: Payer: Self-pay

## 2017-12-31 ENCOUNTER — Telehealth: Payer: Self-pay | Admitting: Internal Medicine

## 2017-12-31 MED ORDER — ALPRAZOLAM 0.25 MG PO TABS: 0.5000 mg | ORAL_TABLET | Freq: Every evening | ORAL | 2 refills | Status: DC | PRN

## 2017-12-31 NOTE — Telephone Encounter (Signed)
Called in alprazolam to pharmacy #[phone] w/2 refills.  dbs

## 2017-12-31 NOTE — Telephone Encounter (Signed)
[place] is calling in regards to patient [name] for her xanax  For pack from may to June.

## 2018-01-03 ENCOUNTER — Ambulatory Visit: Payer: Medicare Other | Admitting: Internal Medicine

## 2018-01-03 ENCOUNTER — Telehealth: Payer: Self-pay

## 2018-01-03 ENCOUNTER — Encounter: Payer: Self-pay | Admitting: Internal Medicine

## 2018-01-03 VITALS — BP 136/78 | HR 104 | Resp 18 | Ht 60.0 in | Wt 182.8 lb

## 2018-01-03 DIAGNOSIS — Z9981 Dependence on supplemental oxygen: Secondary | ICD-10-CM

## 2018-01-03 DIAGNOSIS — R0602 Shortness of breath: Secondary | ICD-10-CM | POA: Diagnosis not present

## 2018-01-03 DIAGNOSIS — G4733 Obstructive sleep apnea (adult) (pediatric): Secondary | ICD-10-CM

## 2018-01-03 DIAGNOSIS — J9611 Chronic respiratory failure with hypoxia: Secondary | ICD-10-CM

## 2018-01-03 DIAGNOSIS — J449 Chronic obstructive pulmonary disease, unspecified: Secondary | ICD-10-CM | POA: Diagnosis not present

## 2018-01-03 DIAGNOSIS — I480 Paroxysmal atrial fibrillation: Secondary | ICD-10-CM | POA: Diagnosis not present

## 2018-01-03 NOTE — Progress Notes (Signed)
[facility] [place], [place] [address]  Pulmonary Sleep Medicine   Office Visit Note  Patient Name: [name] DOB: Feb 10, 1946 MRN [id number]  Date of Service: 01/03/2018  Complaints/HPI:  Patient is here for follow-up evaluation of COPD.  She is also here for follow-up of oxygen evaluation.  This shall represent the face-to-face evaluation for her O2 requirements.  Her baseline saturations are about 97% at rest. She had significant drop in her oxygen saturations noted and therefore she will need to continue oxygen.  She has not had any admissions to the hospital.  She denies any cough or congestion.  She has no chest pain noted.  She does have shortness of breath with exertion.  She states that when she uses her oxygen she gains benefit from the oxygen which significantly helps her dyspnea on exertion.  ROS  General: (-) fever, (-) chills, (-) night sweats, (-) weakness Skin: (-) rashes, (-) itching,. Eyes: (-) visual changes, (-) redness, (-) itching. Nose and Sinuses: (-) nasal stuffiness or itchiness, (-) postnasal drip, (-) nosebleeds, (-) sinus trouble. Mouth and Throat: (-) sore throat, (-) hoarseness. Neck: (-) swollen glands, (-) enlarged thyroid, (-) neck pain. Respiratory: - cough, (-) bloody sputum, + shortness of breath, - wheezing. Cardiovascular: - ankle swelling, (-) chest pain. Lymphatic: (-) lymph node enlargement. Neurologic: (-) numbness, (-) tingling. Psychiatric: (-) anxiety, (-) depression   Current Medication: Outpatient Encounter Medications as of 01/03/2018  Medication Sig  . acetaminophen (TYLENOL) 500 MG tablet Take 500 mg by mouth 2 (two) times daily as needed for moderate pain or headache.  . albuterol (PROVENTIL HFA;VENTOLIN HFA) 108 (90 Base) MCG/ACT inhaler Inhale 2 puffs into the lungs every 6 (six) hours as needed for wheezing or shortness of breath.  [name] albuterol-ipratropium (COMBIVENT) 18-103 MCG/ACT inhaler Inhale 1  puff into the lungs 3 (three) times daily as needed for wheezing or shortness of breath.  . ALPRAZolam (XANAX) 0.25 MG tablet Take 2 tablets (0.5 mg total) by mouth at bedtime as needed for anxiety.  [name] atorvastatin (LIPITOR) 40 MG tablet Take 40 mg by mouth at bedtime.   . Coenzyme Q10 (COQ10) 200 MG CAPS Take 200 mg by mouth daily.  . diclofenac sodium (VOLTAREN) 1 % GEL To the affected joint  . diltiazem (CARDIZEM CD) 120 MG 24 hr capsule Take 1 capsule (120 mg total) by mouth daily.  . DULoxetine (CYMBALTA) 60 MG capsule Take 1 capsule (60 mg total) by mouth 2 (two) times daily.  [name] ELIQUIS 5 MG TABS tablet Take 1 tablet (5 mg total) by mouth 2 (two) times daily.  . ferrous sulfate (FERROUSUL) 325 (65 FE) MG tablet Take 325 mg by mouth daily with breakfast.  . flecainide (TAMBOCOR) 50 MG tablet Take 1 tablet (50 mg total) by mouth 2 (two) times daily.  [name] Ketotifen Fumarate (ALAWAY OP) Apply 1 drop to eye daily as needed (allergies).  [name] levothyroxine (SYNTHROID, LEVOTHROID) 25 MCG tablet Take 25 mcg by mouth daily before breakfast.  . lisinopril-hydrochlorothiazide (PRINZIDE,ZESTORETIC) 20-25 MG tablet Take 1 tablet by mouth daily.  [name] loratadine (CLARITIN) 10 MG tablet Take 10 mg by mouth daily.  . mometasone (ASMANEX) 220 MCG/INH inhaler Inhale 1 puff into the lungs 2 (two) times daily.  . pantoprazole (PROTONIX) 40 MG tablet Take 40 mg by mouth daily.   . potassium chloride (K-DUR,KLOR-CON) 10 MEQ tablet Take 10 mEq by mouth 3 (three) times daily.   . sodium chloride (OCEAN) 0.65 %  SOLN nasal spray Place 1 spray into both nostrils daily as needed for congestion.  . traZODone (DESYREL) 50 MG tablet Take 2 tablets (100 mg total) by mouth at bedtime.   No facility-administered encounter medications on file as of 01/03/2018.     Surgical History: Past Surgical History:  Procedure Laterality Date  . ABDOMINAL AORTIC ANEURYSM REPAIR  2008  . ABDOMINAL HYSTERECTOMY    . AORTIC VALVE  REPLACEMENT  2008  . CARDIAC CATHETERIZATION     [facility]  . CARPAL TUNNEL RELEASE    . ELECTROPHYSIOLOGIC STUDY N/A 08/17/2016   Procedure: Cardioversion;  Surgeon: [name], MD;  Location: [facility] ORS;  Service: Cardiovascular;  Laterality: N/A;  . TUMOR EXCISION Left    x3 (arm)    Medical History: Past Medical History:  Diagnosis Date  . 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 s/p bioprosthetic valve replacement 2008 at [facility];  b. 01/2015 Echo: EF 60-65%, no rwma, Gr1 DD, mildly dil LA, nl RV fxn.  . Atrial flutter ([place])    a. 08/2016 s/p DCCV.  Remains on flecainide 50 mg bid.  . Basal skull fracture ([place]) 20 yrs ago  . Chronic respiratory failure ([place])   . COPD (chronic obstructive pulmonary disease) ([place])    a. on home O2 at 2L since 2008  . 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 History of cardiac cath    a. 2008 prior to Aortic aneurysm repair-->nl cors.  . History of stress test    a. 10/2015 MV: no ischemia/infarct.  [name] HLD (hyperlipidemia)   . HTN (hypertension)   . Hypothyroidism   . Obesity   . PAF (paroxysmal atrial fibrillation) ([name])    a. on Eliquis; b. CHADS2VASc = 3 (HTN, age x 1, female).  . S/P ascending aortic aneurysm repair 2008    Family History: Family History  Problem Relation Age of Onset  . Stroke Father   . Stroke Paternal Grandmother     Social History: Social History   Socioeconomic History  . Marital status: Divorced    Spouse name: Not on file  . Number of children: Not on file  . Years of education: Not on file  . Highest education level: Not on file  Occupational History  . Not on file  Social Needs  . Financial resource strain: Not on file  . Food insecurity:    Worry: Not on file    Inability: Not on file  . Transportation needs:    Medical: Not on file    Non-medical: Not on file  Tobacco Use  . Smoking status: Former Research scientist (life sciences)  . Smokeless tobacco: Never Used  Substance and Sexual  Activity  . Alcohol use: No  . Drug use: No  . Sexual activity: Never    Birth control/protection: Surgical  Lifestyle  . Physical activity:    Days per week: Not on file    Minutes per session: Not on file  . Stress: Not on file  Relationships  . Social connections:    Talks on phone: Not on file    Gets together: Not on file    Attends religious service: Not on file    Active member of club or organization: Not on file    Attends meetings of clubs or organizations: Not on file    Relationship status: Not on file  . Intimate partner violence:    Fear of current or  ex partner: Not on file    Emotionally abused: Not on file    Physically abused: Not on file    Forced sexual activity: Not on file  Other Topics Concern  . Not on file  Social History Narrative  . Not on file    Vital Signs: Blood pressure 136/78, pulse (!) 104, resp. rate 18, height 5' (1.524 m), weight 182 lb 12.8 oz (82.9 kg), SpO2 (!) 88 %.  Examination: General Appearance: The patient is well-developed, well-nourished, and in no distress. Skin: Gross inspection of skin unremarkable. Head: normocephalic, no gross deformities. Eyes: no gross deformities noted. ENT: ears appear grossly normal no exudates. Neck: Supple. No thyromegaly. No LAD. Respiratory: no rhonchi noted. Cardiovascular: Normal S1 and S2 without murmur or rub. Extremities: No cyanosis. pulses are equal. Neurologic: Alert and oriented. No involuntary movements.  LABS: No results found for this or any previous visit (from the past 2160 hour(s)).  Radiology: Nm Myocar Multi W/spect W/wall Motion / Ef  Result Date: 08/09/2017  The study is normal.  This is a low risk study.  The left ventricular ejection fraction is normal (55-65%).     No results found.  No results found.    Assessment and Plan: Patient Active Problem List   Diagnosis Date Noted  . COPD (chronic obstructive pulmonary disease) ([place]) 09/19/2017  . Typical  atrial flutter ([place])   . Irritable bowel syndrome without diarrhea 01/29/2015  . Essential hypertension 01/24/2015  . Paroxysmal atrial fibrillation ([place]) 01/24/2015  . History of aortic valve replacement with bioprosthetic valve 01/24/2015  . Atrial fibrillation with RVR ([place]) 01/11/2015  . 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 Impingement syndrome of shoulder 05/18/2014  . Cellulitis and abscess 02/13/2014  . MRSA (methicillin resistant Staphylococcus aureus) 02/13/2014  . Aneurysm, ascending aorta ([place]) 06/23/2013  . Bicuspid aortic valve 06/23/2013    1. COPD  Severe disease we will continue with present management.  She needs to continue using her oxygen. 2. Chronic respiratory failure with hypoxia  Ongoing oxygen requirements of 2 L.  She has been requalified for oxygen therapy based on her 6 min walk. 3. Paroxysmal Atrial fibrillation  Rate is controlled we will continue with supportive care at this time.  General Counseling: I have discussed the findings of the evaluation and examination with Desirae.  I have also discussed any further diagnostic evaluation thatmay be needed or ordered today. Navina verbalizes understanding of the findings of todays visit. We also reviewed her medications today and discussed drug interactions and side effects including but not limited excessive drowsiness and altered mental states. We also discussed that there is always a risk not just to her but also people around her. she has been encouraged to call the office with any questions or concerns that should arise related to todays visit.    Time spent: 32min  I have personally obtained a history, examined the patient, evaluated laboratory and imaging results, formulated the assessment and plan and placed orders.    Allyne Gee, MD [facility] Pulmonary and Critical Care Sleep medicine

## 2018-01-03 NOTE — Telephone Encounter (Signed)
Faxed office note and 6 min walk results to [facility] for recert for oxygen.  dbs

## 2018-01-05 DIAGNOSIS — H109 Unspecified conjunctivitis: Secondary | ICD-10-CM | POA: Diagnosis not present

## 2018-01-06 ENCOUNTER — Other Ambulatory Visit: Payer: Self-pay

## 2018-01-06 MED ORDER — ALBUTEROL SULFATE HFA 108 (90 BASE) MCG/ACT IN AERS: 2.0000 | INHALATION_SPRAY | Freq: Four times a day (QID) | RESPIRATORY_TRACT | 3 refills | Status: DC | PRN

## 2018-01-09 ENCOUNTER — Other Ambulatory Visit: Payer: Self-pay | Admitting: Internal Medicine

## 2018-01-11 ENCOUNTER — Other Ambulatory Visit: Payer: Self-pay

## 2018-01-11 MED ORDER — ALBUTEROL SULFATE HFA 108 (90 BASE) MCG/ACT IN AERS: 2.0000 | INHALATION_SPRAY | Freq: Four times a day (QID) | RESPIRATORY_TRACT | 3 refills | Status: DC | PRN

## 2018-01-17 ENCOUNTER — Other Ambulatory Visit: Payer: Self-pay

## 2018-01-17 MED ORDER — LEVOTHYROXINE SODIUM 25 MCG PO TABS: 25.0000 ug | ORAL_TABLET | Freq: Every day | ORAL | 1 refills | Status: DC

## 2018-01-17 MED ORDER — PANTOPRAZOLE SODIUM 40 MG PO TBEC: 40.0000 mg | DELAYED_RELEASE_TABLET | Freq: Every day | ORAL | 1 refills | Status: DC

## 2018-02-08 ENCOUNTER — Other Ambulatory Visit: Payer: Self-pay | Admitting: Cardiovascular Disease

## 2018-02-10 ENCOUNTER — Encounter: Payer: Self-pay | Admitting: Nurse Practitioner

## 2018-02-10 ENCOUNTER — Ambulatory Visit: Payer: Medicare Other | Admitting: Nurse Practitioner

## 2018-02-10 VITALS — BP 156/74 | HR 83 | Temp 97.5°F | Resp 16 | Ht 60.0 in | Wt 182.8 lb

## 2018-02-10 DIAGNOSIS — N390 Urinary tract infection, site not specified: Secondary | ICD-10-CM | POA: Diagnosis not present

## 2018-02-10 DIAGNOSIS — R300 Dysuria: Secondary | ICD-10-CM

## 2018-02-10 DIAGNOSIS — J449 Chronic obstructive pulmonary disease, unspecified: Secondary | ICD-10-CM | POA: Diagnosis not present

## 2018-02-10 DIAGNOSIS — R10811 Right upper quadrant abdominal tenderness: Secondary | ICD-10-CM | POA: Diagnosis not present

## 2018-02-10 DIAGNOSIS — I480 Paroxysmal atrial fibrillation: Secondary | ICD-10-CM | POA: Diagnosis not present

## 2018-02-10 DIAGNOSIS — I10 Essential (primary) hypertension: Secondary | ICD-10-CM

## 2018-02-10 LAB — POCT URINALYSIS DIPSTICK
Bilirubin, UA: NEGATIVE
Blood, UA: NEGATIVE
Glucose, UA: NEGATIVE
Ketones, UA: NEGATIVE
Leukocytes, UA: NEGATIVE
Nitrite, UA: NEGATIVE
Protein, UA: NEGATIVE
Spec Grav, UA: <=1.005 — AB (ref 1.010–1.025)
Urobilinogen, UA: NEGATIVE E.U./dL — AB
pH, UA: 7.5 (ref 5.0–8.0)

## 2018-02-10 NOTE — Progress Notes (Signed)
[facility] [place], [place] [address]  Internal MEDICINE  Office Visit Note  Patient Name: [name]  [id number]  [id number]  Date of Service: 03/02/2018   Pt is here for a sick visit.  Chief Complaint  Patient presents with  . Back Pain    right back pain, saturday and tuesday was the worse      The patient has been having trouble with right flank pain. Has been bothering her intermittently for past 2 months. Hurts more when she turns her body to the right. Also hurts more when she has to lift and carry heavy objects. She states that, in the last few months, she had a change in her bowel habits. Was having bowel movements more than once per day.Recently started having bowel movements every other day. The flank pain seemed to get worse when bowel movements changed. She states that she has abdominal bloating and a lot ot excess gas. Gas can be so bad, she will have to stay at home. She denies nausea or vomiting. She has no pain with urination or increase in frequency of urination.        Current Medication:  Outpatient Encounter Medications as of 02/10/2018  Medication Sig  . OXYGEN Inhale into the lungs.  [name] acetaminophen (TYLENOL) 500 MG tablet Take 500 mg by mouth 2 (two) times daily as needed for moderate pain or headache.  . albuterol (PROVENTIL HFA;VENTOLIN HFA) 108 (90 Base) MCG/ACT inhaler Inhale 2 puffs into the lungs every 6 (six) hours as needed for wheezing or shortness of breath.  [name] albuterol-ipratropium (COMBIVENT) 18-103 MCG/ACT inhaler Inhale 1 puff into the lungs 3 (three) times daily as needed for wheezing or shortness of breath.  . ALPRAZolam (XANAX) 0.25 MG tablet Take 2 tablets (0.5 mg total) by mouth at bedtime as needed for anxiety.  [name] atorvastatin (LIPITOR) 40 MG tablet 1 tab po qhs  . Coenzyme Q10 (COQ10) 200 MG CAPS Take 200 mg by mouth daily.  . diclofenac sodium (VOLTAREN) 1 % GEL To the affected joint  . DULoxetine  (CYMBALTA) 60 MG capsule Take 1 capsule (60 mg total) by mouth 2 (two) times daily.  [name] ELIQUIS 5 MG TABS tablet Take 1 tablet (5 mg total) by mouth 2 (two) times daily.  . ferrous sulfate (FERROUSUL) 325 (65 FE) MG tablet Take 325 mg by mouth daily with breakfast.  . flecainide (TAMBOCOR) 50 MG tablet Take 1 tablet (50 mg total) by mouth 2 (two) times daily.  [name] Ketotifen Fumarate (ALAWAY OP) Apply 1 drop to eye daily as needed (allergies).  [name] levothyroxine (SYNTHROID, LEVOTHROID) 25 MCG tablet Take 1 tablet (25 mcg total) by mouth daily before breakfast.  . lisinopril-hydrochlorothiazide (PRINZIDE,ZESTORETIC) 20-25 MG tablet Take 1 tablet by mouth daily.  [name] loratadine (CLARITIN) 10 MG tablet Take 10 mg by mouth daily.  . mometasone (ASMANEX) 220 MCG/INH inhaler Inhale 1 puff into the lungs 2 (two) times daily.  . pantoprazole (PROTONIX) 40 MG tablet Take 1 tablet (40 mg total) by mouth daily.  . potassium chloride (K-DUR,KLOR-CON) 10 MEQ tablet Take 10 mEq by mouth 3 (three) times daily.   . sodium chloride (OCEAN) 0.65 % SOLN nasal spray Place 1 spray into both nostrils daily as needed for congestion.  . traZODone (DESYREL) 50 MG tablet Take 2 tablets (100 mg total) by mouth at bedtime.  . [DISCONTINUED] diltiazem (CARDIZEM CD) 120 MG 24 hr capsule Take 1 capsule (120 mg total) by mouth  daily.   No facility-administered encounter medications on file as of 02/10/2018.       Medical History: Past Medical History:  Diagnosis Date  . 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 s/p bioprosthetic valve replacement 2008 at [facility];  b. 01/2015 Echo: EF 60-65%, no rwma, Gr1 DD, mildly dil LA, nl RV fxn.  . Atrial flutter ([place])    a. 08/2016 s/p DCCV.  Remains on flecainide 50 mg bid.  . Basal skull fracture ([place]) 20 yrs ago  . Chronic respiratory failure ([place])   . COPD (chronic obstructive pulmonary disease) ([place])    a. on home O2 at 2L since 2008  . 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 History of cardiac cath    a. 2008 prior to Aortic aneurysm repair-->nl cors.  . History of stress test    a. 10/2015 MV: no ischemia/infarct.  [name] HLD (hyperlipidemia)   . HTN (hypertension)   . Hypothyroidism   . Obesity   . PAF (paroxysmal atrial fibrillation) ([name])    a. on Eliquis; b. CHADS2VASc = 3 (HTN, age x 1, female).  . S/P ascending aortic aneurysm repair 2008    Today's Vitals   02/10/18 1404 02/10/18 1408  BP: (!) 156/74   Pulse: 83   Resp: 16   Temp: (!) 97.5 F (36.4 C)   SpO2: 95% 95%  Weight: 182 lb 12.8 oz (82.9 kg)   Height: 5' (1.524 m)     Review of Systems  Constitutional: Positive for fatigue.  HENT: Negative.   Respiratory: Positive for shortness of breath.   Cardiovascular: Positive for chest pain and palpitations.  Gastrointestinal: Negative for diarrhea, nausea, rectal pain and vomiting.  Endocrine:       Thyroid problems   Genitourinary: Positive for flank pain. Negative for frequency, hematuria and urgency.  Musculoskeletal: Positive for arthralgias, back pain, myalgias and neck pain.  Skin: Negative for rash.  Allergic/Immunologic: Negative.   Neurological: Positive for headaches.  Hematological: Negative.   Psychiatric/Behavioral: Positive for agitation. The patient is nervous/anxious.     Physical Exam  Constitutional: She is oriented to person, place, and time. She appears well-developed and well-nourished.  HENT:  Head: Normocephalic and atraumatic.  Eyes: Pupils are equal, round, and reactive to light.  Neck: Normal range of motion. Neck supple. No thyromegaly present.  Cardiovascular: Normal rate. An irregular rhythm present.  Murmur heard. Pulmonary/Chest: Effort normal. She has decreased breath sounds. She has no rhonchi. She has no rales.  The patient is on nsal cannula oxygen at all times.   Abdominal: Soft. Bowel sounds are normal. There is no tenderness.  Genitourinary:  Genitourinary Comments: Urine  sample is without abnormality. She does have some right sided flank pain with palpation.   Musculoskeletal: Normal range of motion.  Lymphadenopathy:    She has no cervical adenopathy.  Neurological: She is alert and oriented to person, place, and time.  Skin: Skin is warm and dry.  Psychiatric: Her speech is normal and behavior is normal. Judgment and thought content normal. Her mood appears anxious. Cognition and memory are normal. She exhibits a depressed mood.  Nursing note and vitals reviewed.  Assessment/Plan: 1. Dysuria - POCT Urinalysis Dipstick not showing any evidence of acute infection or abnormality today.   2. Urinary tract infection without hematuria, site unspecified - CULTURE, URINE COMPREHENSIVE. Treat with antibiotics as indicated.   3. Right upper quadrant abdominal  tenderness without rebound tenderness Right flank. Believe to be musculoskeletal in natre. Will continue to monitor.   4. Paroxysmal atrial fibrillation ([place]) Continue visits with cardiology as scheduled   5. Essential hypertension Continue bp medications as prescribed.   6. 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 exacerbation ([place]) Continue respiratory medications as prescribed and oxygen at 2lpm.  General Counseling: Chloris verbalizes understanding of the findings of todays visit and agrees with plan of treatment. I have discussed any further diagnostic evaluation that may be needed or ordered today. We also reviewed her medications today. she has been encouraged to call the office with any questions or concerns that should arise related to todays visit.    Counseling:  This patient was seen by Andrea Pol, FNP- C in Collaboration with Dr [name] as a part of collaborative care agreement   Orders Placed This Encounter  Procedures  . CULTURE, URINE COMPREHENSIVE  . POCT Urinalysis Dipstick      Time spent:20 Minutes

## 2018-02-13 LAB — CULTURE, URINE COMPREHENSIVE

## 2018-02-28 ENCOUNTER — Telehealth: Payer: Self-pay | Admitting: Cardiovascular Disease

## 2018-02-28 NOTE — Telephone Encounter (Signed)
Pt c/o BP issue: STAT if pt c/o blurred vision, one-sided weakness or slurred speech  1. What are your last 5 BP readings?  6/19- am 161/90,  6/20- 165/84 6/21-165/91 6/22-150/81 6/23-168/90  2. Are you having any other symptoms (ex. Dizziness, headache, blurred vision, passed out)? headache  3. What is your BP issue? elevated

## 2018-02-28 NOTE — Telephone Encounter (Signed)
Pt returning our call   Please call

## 2018-02-28 NOTE — Telephone Encounter (Signed)
S/w patient. She says her BP has become more elevated over the past several weeks. She says every time she comes here its fine but she gets higher readings at home. 2/28 with Dr Fletcher Anon 110/58 6/6 with Pulmonology 156/74  At home: 6/19- am 161/90,  6/20- 165/84 6/21-165/91 6/22-150/81 6/23-168/90 She says these readings were taken about 2 hours after morning BP meds. Patient unsure of meds she is taking as [facility] and confirmed she is taking cardiac medications as prescribed. Patient denies swelling. She is short of breath but has COPD so her shortness of breath is at baseline.  Routing to Dr Fletcher Anon for advice.

## 2018-03-01 DIAGNOSIS — L578 Other skin changes due to chronic exposure to nonionizing radiation: Secondary | ICD-10-CM | POA: Diagnosis not present

## 2018-03-01 DIAGNOSIS — L820 Inflamed seborrheic keratosis: Secondary | ICD-10-CM | POA: Diagnosis not present

## 2018-03-01 DIAGNOSIS — L821 Other seborrheic keratosis: Secondary | ICD-10-CM | POA: Diagnosis not present

## 2018-03-01 MED ORDER — DILTIAZEM HCL ER 180 MG PO CP24: 180.0000 mg | ORAL_CAPSULE | Freq: Every day | ORAL | 3 refills | Status: DC

## 2018-03-01 NOTE — Telephone Encounter (Signed)
No answer. Left message to call back.   

## 2018-03-01 NOTE — Telephone Encounter (Signed)
Patient returning call.

## 2018-03-01 NOTE — Telephone Encounter (Signed)
Called patient and she verbalized understanding to increase diltiazem to 180 mg by mouth once a day. Rx sent to pharmacy of choice.

## 2018-03-01 NOTE — Telephone Encounter (Signed)
Increase diltiazem extended release to 180 mg once daily.

## 2018-03-02 ENCOUNTER — Encounter: Payer: Self-pay | Admitting: Nurse Practitioner

## 2018-03-02 DIAGNOSIS — N390 Urinary tract infection, site not specified: Secondary | ICD-10-CM | POA: Insufficient documentation

## 2018-03-02 DIAGNOSIS — R10811 Right upper quadrant abdominal tenderness: Secondary | ICD-10-CM | POA: Insufficient documentation

## 2018-03-02 DIAGNOSIS — J449 Chronic obstructive pulmonary disease, unspecified: Secondary | ICD-10-CM | POA: Insufficient documentation

## 2018-03-02 DIAGNOSIS — R300 Dysuria: Secondary | ICD-10-CM | POA: Insufficient documentation

## 2018-03-02 HISTORY — DX: Right upper quadrant abdominal tenderness: R10.811

## 2018-03-03 ENCOUNTER — Telehealth: Payer: Self-pay | Admitting: Nurse Practitioner

## 2018-03-03 NOTE — Telephone Encounter (Signed)
Pt called and state that the pain in her back has moved to the front right like you said it might , she states that it is very  Painful for the last 5 hours and constant, she states that she has been taking tylenol and using joint ointment , wondering what she should do if you think she needs to go to the er?

## 2018-03-03 NOTE — Telephone Encounter (Signed)
PT ADVISED THAT SHE NEEDS TO GO TO THE ER

## 2018-03-03 NOTE — Telephone Encounter (Signed)
Being that I have seen her just recently for this, I guess Er visit would be the best.

## 2018-03-04 ENCOUNTER — Telehealth: Payer: Self-pay

## 2018-03-04 ENCOUNTER — Other Ambulatory Visit: Payer: Self-pay

## 2018-03-04 ENCOUNTER — Other Ambulatory Visit: Payer: Self-pay | Admitting: Nurse Practitioner

## 2018-03-04 DIAGNOSIS — R1010 Upper abdominal pain, unspecified: Secondary | ICD-10-CM

## 2018-03-04 NOTE — Telephone Encounter (Signed)
Can we go ahead and set her up for abdominal ultrasound. Having RUQ pain with radatio to right upper back. Thanks.

## 2018-03-08 ENCOUNTER — Telehealth: Payer: Self-pay | Admitting: Medical

## 2018-03-08 NOTE — Telephone Encounter (Signed)
Received a call from the patient that she started experiencing a fluttering sensation in her chest this evening associated with intermittent cold sensations followed by a flushing sensation. She denies overt chest pain, but does reports some SOB and lightheadedness. She took her VS at home and reported a BP 130/78, HR 48, and O2 sat 97%. She recently called reported elevated home BP recordings and was recommended to increase her diltiazem to 180mg  daily from 120mg  daily. Possible this change has contributed to her bradycardia. Also possible that she is back in atrial fibrillation/flutter. Patient was instructed to present to the ED if her symptoms worsened, bradycardia worsened, or she developed CP. Also recommended that she hold her morning diltiazem dose tomorrow and call Dr. Tyrell Antonio office first thing in the morning to be seen. Patient was in agreement with the plan.

## 2018-03-09 ENCOUNTER — Telehealth: Payer: Self-pay | Admitting: Cardiovascular Disease

## 2018-03-09 ENCOUNTER — Ambulatory Visit
Admission: RE | Admit: 2018-03-09 | Discharge: 2018-03-09 | Disposition: A | Discharge status: Home / Self Care | Payer: Medicare Other | Source: Ambulatory Visit | Attending: Nurse Practitioner | Admitting: Nurse Practitioner

## 2018-03-09 DIAGNOSIS — R1010 Upper abdominal pain, unspecified: Secondary | ICD-10-CM

## 2018-03-09 DIAGNOSIS — R1011 Right upper quadrant pain: Secondary | ICD-10-CM | POA: Diagnosis not present

## 2018-03-09 DIAGNOSIS — K8020 Calculus of gallbladder without cholecystitis without obstruction: Secondary | ICD-10-CM | POA: Diagnosis not present

## 2018-03-09 MED ORDER — HYDRALAZINE HCL 25 MG PO TABS: 25.0000 mg | ORAL_TABLET | Freq: Two times a day (BID) | ORAL | 6 refills | Status: DC

## 2018-03-09 MED ORDER — DILTIAZEM HCL ER COATED BEADS 120 MG PO CP24: 120.0000 mg | ORAL_CAPSULE | Freq: Every day | ORAL | Status: DC

## 2018-03-09 NOTE — Telephone Encounter (Signed)
I spoke with the patient. She recently had her diltiazem increased from 120 mg once daily to 180 mg once daily on 03/01/18 due to elevated BP readings.  She calls today reporting a HR aroung 48 bpm yesterday evening around 5:30 pm. She states she felt a "fluttering" in her chest, then she felt cold sensation come over her then she had a warm flash occur. She states her HR did go up some last night to the 60's. Today she reports readings of: 7am- 154/92 (57) 7:30 am- 141/82 (58) Now her HR readings are around 75 bpm. She has not taken any diltiazem this morning as she does not know which does she should take.  She did go out and run some errands this morning and felt well. I advised her I will need to send to Dr. Fletcher Anon his APP to review and call her back.  She states she spoke with the on call provider last night and was instructed to call us today.

## 2018-03-09 NOTE — Telephone Encounter (Signed)
Decrease diltiazem back to 120 mg once daily. Add hydralazine 25 mg twice daily.

## 2018-03-09 NOTE — Telephone Encounter (Signed)
I called and spoke with the patient. She is aware of Dr. Tyrell Antonio recommendations to decrease diltiazem back to 120 mg once daily and start hydralazine 25 mg BID.  She states she just bought a 90-day supply of the diltiazem 180 mg tablets. I advised her to set these aside.  She is aware I will just send in a 30-day supply of hydralazine to start with. She would like this to go to [facility] on [address].

## 2018-03-09 NOTE — Telephone Encounter (Signed)
I left a message for the patient to call back 

## 2018-03-09 NOTE — Telephone Encounter (Signed)
Pt calling stating last night around 5:30 pm  HR was running around 48  She states she had a funny feeling overall and then she got really cold and that lasted a bit then got really warm all the sudden  She states this morning her HR was about 7 am it was about 68 and then dropped to 58 She takes her normal medications around 9 am  She stated we did change her Diltiazem and she thinks it may be this   Would like advise on this  Please call back

## 2018-03-09 NOTE — Telephone Encounter (Signed)
Pt called to check status of her question about her medication earlier today. Informed pt RN will return her call today.

## 2018-03-10 ENCOUNTER — Other Ambulatory Visit: Payer: Self-pay | Admitting: Internal Medicine

## 2018-03-11 ENCOUNTER — Other Ambulatory Visit: Payer: Self-pay | Admitting: Nurse Practitioner

## 2018-03-11 ENCOUNTER — Telehealth: Payer: Self-pay

## 2018-03-11 DIAGNOSIS — F411 Generalized anxiety disorder: Secondary | ICD-10-CM

## 2018-03-11 MED ORDER — ALPRAZOLAM 0.25 MG PO TABS: 0.5000 mg | ORAL_TABLET | Freq: Two times a day (BID) | ORAL | 3 refills | Status: DC | PRN

## 2018-03-11 NOTE — Progress Notes (Signed)
Renewed rx for alprazlam 0.25mg  bid prn and sent to [facility].

## 2018-03-11 NOTE — Telephone Encounter (Signed)
Renewed rx for alprazlam 0.25mg  bid prn and sent to [facility].

## 2018-03-11 NOTE — Telephone Encounter (Signed)
Yes I have. She does have gallstones. We need to go ahead and refer her to surgery. Thanks.

## 2018-03-15 NOTE — Telephone Encounter (Signed)
informed pt of results and explained that the referral would be done and the first visit with the surgeon she would get all the information she needed to know about the surgery and she would have to be cleared by cardiologist and pulmonary doctor before she have any surgery.

## 2018-03-15 NOTE — Telephone Encounter (Signed)
The initial appointment with surgeon would just be a consult. Not sure Dr. Allen Norris does that ind of surgery or not. She would have to be cleared by cardiology and pulmonology before any surgery was actually done .Surgeon may talk with her and decide surgery is not a good option. Again, there referral was for consult only.

## 2018-03-16 ENCOUNTER — Telehealth: Payer: Self-pay

## 2018-03-16 NOTE — Telephone Encounter (Signed)
Pt called that she call beth back for referral for surgeons and gave her name who she like to see

## 2018-03-22 DIAGNOSIS — G4733 Obstructive sleep apnea (adult) (pediatric): Secondary | ICD-10-CM | POA: Diagnosis not present

## 2018-03-24 ENCOUNTER — Encounter: Payer: Self-pay | Admitting: Surgery

## 2018-03-24 ENCOUNTER — Ambulatory Visit (INDEPENDENT_AMBULATORY_CARE_PROVIDER_SITE_OTHER): Payer: Medicare Other | Admitting: Surgery

## 2018-03-24 VITALS — BP 133/74 | HR 89 | Temp 98.1°F | Ht 60.0 in | Wt 180.0 lb

## 2018-03-24 DIAGNOSIS — K5900 Constipation, unspecified: Secondary | ICD-10-CM | POA: Diagnosis not present

## 2018-03-24 DIAGNOSIS — K8020 Calculus of gallbladder without cholecystitis without obstruction: Secondary | ICD-10-CM

## 2018-03-24 NOTE — Progress Notes (Signed)
Surgical Clinic History and Physical  Referring provider:  [name], MD [place], [place] [address]  HISTORY OF PRESENT ILLNESS (HPI):  72 y.o. female presents for evaluation of cramping abdominal pain. Patient reports a chronic history of IBS with alternating diarrhea and constipation with loose BM's BID. However, more recently over the past 3+ months, she has been experiencing BM's every other day or every third day, sometimes being hard and small while other times loose with constant cramping Right flank pain not noticeably related to what she eats and sometimes varying in location. Patient also recalls a single episode of RUQ abdominal pain after eating a cheeseburger 3 weeks ago with no similar episodes prior to or since then. Patient otherwise denies blood per rectum, unintentional weight loss, fever/chills, N/V, or CP.  PAST MEDICAL HISTORY (PMH):  Past Medical History:  Diagnosis Date  . 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 s/p bioprosthetic valve replacement 2008 at [facility];  b. 01/2015 Echo: EF 60-65%, no rwma, Gr1 DD, mildly dil LA, nl RV fxn.  . Atrial fibrillation with RVR ([place]) 01/11/2015  . Atrial flutter ([place])    a. 08/2016 s/p DCCV.  Remains on flecainide 50 mg bid.  . Basal skull fracture ([place]) 20 yrs ago  . Chronic respiratory failure ([place])   . COPD (chronic obstructive pulmonary disease) ([place])    a. on home O2 at 2L since 2008  . 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 Essential hypertension 01/24/2015  . History of cardiac cath    a. 2008 prior to Aortic aneurysm repair-->nl cors.  . History of stress test    a. 10/2015 MV: no ischemia/infarct.  [name] HLD (hyperlipidemia)   . HTN (hypertension)   . Hypothyroidism   . Obesity   . PAF (paroxysmal atrial fibrillation) ([name])    a. on Eliquis; b. CHADS2VASc = 3 (HTN, age x 1, female).  . Paroxysmal atrial fibrillation ([place]) 01/24/2015  . Right upper quadrant abdominal  tenderness without rebound tenderness 03/02/2018  . S/P ascending aortic aneurysm repair 2008     PAST SURGICAL HISTORY [facility]):  Past Surgical History:  Procedure Laterality Date  . ABDOMINAL AORTIC ANEURYSM REPAIR  2008  . ABDOMINAL HYSTERECTOMY    . AORTIC VALVE REPLACEMENT  2008  . CARDIAC CATHETERIZATION     [facility]  . CARPAL TUNNEL RELEASE    . ELECTROPHYSIOLOGIC STUDY N/A 08/17/2016   Procedure: Cardioversion;  Surgeon: [name], MD;  Location: [facility] ORS;  Service: Cardiovascular;  Laterality: N/A;  . TUMOR EXCISION Left    x3 (arm)     MEDICATIONS:  Prior to Admission medications   Medication Sig Start Date End Date Taking? Authorizing Provider  acetaminophen (TYLENOL) 500 MG tablet Take 500 mg by mouth 2 (two) times daily as needed for moderate pain or headache.   Yes [provider]  albuterol (PROVENTIL HFA;VENTOLIN HFA) 108 (90 Base) MCG/ACT inhaler Inhale 2 puffs into the lungs every 6 (six) hours as needed for wheezing or shortness of breath. 01/11/18  Yes Boscia, Greer Ee, NP  albuterol-ipratropium (COMBIVENT) 18-103 MCG/ACT inhaler Inhale 1 puff into the lungs 3 (three) times daily as needed for wheezing or shortness of breath.   Yes [provider]  ALPRAZolam (XANAX) 0.25 MG tablet Take 2 tablets (0.5 mg total) by mouth 2 (two) times daily as needed for anxiety. 03/11/18  Yes Ronnell Freshwater, NP  ASMANEX HFA 200 MCG/ACT AERO  01/05/18  Yes [provider]  atorvastatin (LIPITOR) 40 MG tablet 1 tab po qhs 01/10/18  Yes Boscia, Heather E, NP  Coenzyme Q10 (COQ10) 200 MG CAPS Take 200 mg by mouth daily.   Yes [provider]  diclofenac sodium (VOLTAREN) 1 % GEL To the affected joint 09/17/17  Yes Boscia, Heather E, NP  diltiazem (CARDIZEM CD) 120 MG 24 hr capsule Take 1 capsule (120 mg total) by mouth daily. 03/09/18 06/07/18 Yes [name], MD  diltiazem (TIAZAC) 180 MG 24 hr capsule Take by mouth daily. 03/01/18  Yes [provider]  DULoxetine (CYMBALTA) 60 MG capsule Take 1 capsule (60 mg total) by mouth 2 (two) times daily. 09/20/17  Yes Boscia, Heather E, NP  ELIQUIS 5 MG TABS tablet Take 1 tablet (5 mg total) by mouth 2 (two) times daily. 11/15/17  Yes Gollan, Kathlene November, MD  ferrous sulfate (FERROUSUL) 325 (65 FE) MG tablet Take 325 mg by mouth daily with breakfast.   Yes [provider]  flecainide (TAMBOCOR) 50 MG tablet Take 1 tablet (50 mg total) by mouth 2 (two) times daily. 11/11/17  Yes [name], MD  hydrALAZINE (APRESOLINE) 25 MG tablet Take 1 tablet (25 mg total) by mouth 2 (two) times daily. 03/09/18 06/07/18 Yes [name], MD  Ketotifen Fumarate (ALAWAY OP) Apply 1 drop to eye daily as needed (allergies).   Yes [provider]  levothyroxine (SYNTHROID, LEVOTHROID) 25 MCG tablet Take 1 tablet (25 mcg total) by mouth daily before breakfast. 01/17/18  Yes Boscia, Heather E, NP  lisinopril-hydrochlorothiazide (PRINZIDE,ZESTORETIC) 20-25 MG tablet Take 1 tablet by mouth daily. 02/08/18  Yes [name], MD  loratadine (CLARITIN) 10 MG tablet Take 10 mg by mouth daily.   Yes [provider]  mometasone (ASMANEX) 220 MCG/INH inhaler Inhale 1 puff into the lungs 2 (two) times daily.   Yes [provider]  neomycin-polymyxin b-dexamethasone (MAXITROL) 3.5-10000-0.1 SUSP INSTILL 1 DROP(S) IN LEFT EYE 3 TIMES A DAY 01/05/18  Yes [provider]  Omega-3 1000 MG CAPS Take by mouth.   Yes [provider]  OXYGEN Inhale into the lungs.   Yes [provider]  pantoprazole (PROTONIX) 40 MG tablet Take 1 tablet (40 mg total) by mouth daily. 01/17/18  Yes Boscia, Heather E, NP  potassium chloride (K-DUR,KLOR-CON) 10 MEQ tablet Take 10 mEq by mouth 3 (three) times daily.    Yes [provider]  potassium chloride (MICRO-K) 10 MEQ CR capsule take 1 capsule po TID 03/11/18  Yes Boscia, Heather E, NP  propranolol (INDERAL) 10 MG tablet  Take by mouth.   Yes [provider]  sodium chloride (OCEAN) 0.65 % SOLN nasal spray Place 1 spray into both nostrils daily as needed for congestion.   Yes [provider]  traZODone (DESYREL) 50 MG tablet Take 2 tablets (100 mg total) by mouth at bedtime. 11/22/17  Yes Ronnell Freshwater, NP     ALLERGIES:  Allergies  Allergen Reactions  . Levaquin [Levofloxacin In D5w] Other (See Comments)    Reaction:  Fatigue and muscle soreness  . Levofloxacin Other (See Comments)    Fatigue, muscle soreness  . Soy Allergy Nausea And Vomiting and Hives  . Sulfasalazine     Other reaction(s): Other (See Comments) Reaction:Unknown  . Benadryl [Diphenhydramine Hcl (Sleep)] Palpitations  . Cetirizine Palpitations  . Diphenhydramine-Zinc Acetate Palpitations  . Lasix [Furosemide] Rash  .  Meloxicam Rash  . Sulfa Antibiotics Rash     SOCIAL HISTORY:  Social History   Socioeconomic History  . Marital status: Divorced    Spouse name: Not on file  . Number of children: Not on file  . Years of education: Not on file  . Highest education level: Not on file  Occupational History  . Not on file  Social Needs  . Financial resource strain: Not on file  . Food insecurity:    Worry: Not on file    Inability: Not on file  . Transportation needs:    Medical: Not on file    Non-medical: Not on file  Tobacco Use  . Smoking status: Former Research scientist (life sciences)  . Smokeless tobacco: Never Used  Substance and Sexual Activity  . Alcohol use: No  . Drug use: No  . Sexual activity: Never    Birth control/protection: Surgical  Lifestyle  . Physical activity:    Days per week: Not on file    Minutes per session: Not on file  . Stress: Not on file  Relationships  . Social connections:    Talks on phone: Not on file    Gets together: Not on file    Attends religious service: Not on file    Active member of club or organization: Not on file    Attends meetings of clubs or organizations: Not on  file    Relationship status: Not on file  . Intimate partner violence:    Fear of current or ex partner: Not on file    Emotionally abused: Not on file    Physically abused: Not on file    Forced sexual activity: Not on file  Other Topics Concern  . Not on file  Social History Narrative  . Not on file    The patient currently resides (home / rehab facility / nursing home): Home The patient normally is (ambulatory / bedbound): Limited ambulation  FAMILY HISTORY:  Family History  Problem Relation Age of Onset  . Stroke Father   . Stroke Paternal Grandmother     Otherwise negative/non-contributory.  REVIEW OF SYSTEMS:  Constitutional: denies any other weight loss, fever, chills, or sweats  Eyes: denies any other vision changes, history of eye injury  ENT: denies sore throat, hearing problems  Respiratory: denies shortness of breath, wheezing  Cardiovascular: denies chest pain, palpitations  Gastrointestinal: abdominal pain, N/V, and bowel function as per HPI Musculoskeletal: denies any other joint pains or cramps  Skin: Denies any other rashes or skin discolorations Neurological: denies any other headache, dizziness, weakness  Psychiatric: Denies any other depression, anxiety   All other review of systems were otherwise negative   VITAL SIGNS:  BP 133/74   Pulse 89   Temp 98.1 F (36.7 C) (Oral)   Ht 5' (1.524 m)   Wt 180 lb (81.6 kg)   BMI 35.15 kg/m   PHYSICAL EXAM:  Constitutional:  -- Obese body habitus  -- Awake, alert, and oriented x3  Eyes:  -- Pupils equally round and reactive to light  -- No scleral icterus  Ear, nose, throat:  -- No jugular venous distension -- No nasal drainage, bleeding Pulmonary:  -- No crackles  -- Equal breath sounds bilaterally -- Breathing non-labored at rest Cardiovascular:  -- S1, S2 present  -- No pericardial rubs  Gastrointestinal:  -- Abdomen soft, nontender, and non-distended with no guarding/rebound  -- No  abdominal masses appreciated, pulsatile or otherwise  Musculoskeletal and Integumentary:  -- Wounds  or skin discoloration: None appreciated -- Extremities: B/L UE and LE FROM, hands and feet warm  Neurologic:  -- Motor function: Intact and symmetric -- Sensation: Intact and symmetric  Labs:  CBC Latest Ref Rng & Units 01/22/2017 08/13/2016 10/26/2015  WBC 3.6 - 11.0 K/uL 8.6 6.2 5.9  Hemoglobin 12.0 - 16.0 g/dL 11.9(L) 12.5 12.5  Hematocrit 35.0 - 47.0 % 35.0 37.0 37.6  Platelets 150 - 440 K/uL 273 275 298   CMP Latest Ref Rng & Units 01/22/2017 11/18/2016 08/13/2016  Glucose 65 - 99 mg/dL 109(H) - 106(H)  BUN 6 - 20 mg/dL 14 - 12  Creatinine 0.44 - 1.00 mg/dL 0.72 0.60 0.76  Sodium 135 - 145 mmol/L 134(L) - 134(L)  Potassium 3.5 - 5.1 mmol/L 3.6 - 3.5  Chloride 101 - 111 mmol/L 97(L) - 98(L)  CO2 22 - 32 mmol/L 29 - 26  Calcium 8.9 - 10.3 mg/dL 9.4 - 9.5  Total Protein 6.5 - 8.1 g/dL - - -  Total Bilirubin 0.3 - 1.2 mg/dL - - -  Alkaline Phos 38 - 126 U/L - - -  AST 15 - 41 U/L - - -  ALT 14 - 54 U/L - - -    Imaging studies:  Limited RUQ Abdominal Ultrasound (03/09/2018) Cholelithiasis without sonographic evidence of acute cholecystitis. Appearance of the liver is suggestive of steatosis.  Assessment/Plan:  72 y.o. female with symptoms more consistent with constipation than symptomatic cholelithiasis except for a single episode that could be attributed to symptomatic cholelithiasis that has not since recurred despite unchanged diet, complicated by co-morbidities including morbid obesity (BMI >35), HTN, HLD, CAD with chronic anticoagulation for atrial fibrillation s/p aortic valve replacement for stenosis and ascending aortic aneurysm repair, severe COPD on routine home supplemental oxygen (not prn), hypothyroidism, basal skull fracture, and partial deafness.   - importance of adequate hydration and high fiber diet discussed  - routine once - twice daily colace stool softener to  reduce constipation  - Miralax or magnesium citrate as needed for constipation despite above  - advised to keep a record / log of foods with which experience RUQ/epigastric pain despite above recommendations (if any)  - return to clinic in 3 weeks, instructed to call if any questions or concerns  - applauded for smoking cessation after open heart surgery  All of the above recommendations were discussed with the patient, and all of patient's questions were answered to her expressed satisfaction.  Thank you for the opportunity to participate in this patient's care.  -- Marilynne Drivers Rosana Hoes, MD, [place]: [facility] - Partnering for exceptional care. Office: [phone]

## 2018-03-24 NOTE — Patient Instructions (Addendum)
Please increase water to 72 ounces daily.  Take a Colace once/twice daily. Miralax or Magnesium Citrate as needed.  Please keep a diary of the right upper quadrant pain and what foods seem to cause the pain.   Please see your follow up appointment listed below.   High-Fiber Diet Fiber, also called dietary fiber, is a type of carbohydrate found in fruits, vegetables, whole grains, and beans. A high-fiber diet can have many health benefits. Your health care provider may recommend a high-fiber diet to help:  Prevent constipation. Fiber can make your bowel movements more regular.  Lower your cholesterol.  Relieve hemorrhoids, uncomplicated diverticulosis, or irritable bowel syndrome.  Prevent overeating as part of a weight-loss plan.  Prevent heart disease, type 2 diabetes, and certain cancers.  What is my plan? The recommended daily intake of fiber includes:  38 grams for men under age 18.  24 grams for men over age 59.  75 grams for women under age 41.  66 grams for women over age 36.  You can get the recommended daily intake of dietary fiber by eating a variety of fruits, vegetables, grains, and beans. Your health care provider may also recommend a fiber supplement if it is not possible to get enough fiber through your diet. What do I need to know about a high-fiber diet?  Fiber supplements have not been widely studied for their effectiveness, so it is better to get fiber through food sources.  Always check the fiber content on thenutrition facts label of any prepackaged food. Look for foods that contain at least 5 grams of fiber per serving.  Ask your dietitian if you have questions about specific foods that are related to your condition, especially if those foods are not listed in the following section.  Increase your daily fiber consumption gradually. Increasing your intake of dietary fiber too quickly may cause bloating, cramping, or gas.  Drink plenty of water. Water  helps you to digest fiber. What foods can I eat? Grains Whole-grain breads. Multigrain cereal. Oats and oatmeal. Brown rice. Barley. Bulgur wheat. [place]. Bran muffins. Popcorn. Rye wafer crackers. Vegetables Sweet potatoes. Spinach. Kale. Artichokes. Cabbage. Broccoli. Green peas. Carrots. Squash. Fruits Berries. Pears. Apples. Oranges. Avocados. Prunes and raisins. Dried figs. Meats and Other Protein Sources [place], kidney, pinto, and soy beans. Split peas. Lentils. Nuts and seeds. Dairy Fiber-fortified yogurt. Beverages Fiber-fortified soy milk. Fiber-fortified orange juice. Other Fiber bars. The items listed above may not be a complete list of recommended foods or beverages. Contact your dietitian for more options. What foods are not recommended? Grains White bread. Pasta made with refined flour. White rice. Vegetables Fried potatoes. Canned vegetables. Well-cooked vegetables. Fruits Fruit juice. Cooked, strained fruit. Meats and Other Protein Sources Fatty cuts of meat. Fried Sales executive or fried fish. Dairy Milk. Yogurt. Cream cheese. Sour cream. Beverages Soft drinks. Other Cakes and pastries. Butter and oils. The items listed above may not be a complete list of foods and beverages to avoid. Contact your dietitian for more information. What are some tips for including high-fiber foods in my diet?  Eat a wide variety of high-fiber foods.  Make sure that half of all grains consumed each day are whole grains.  Replace breads and cereals made from refined flour or white flour with whole-grain breads and cereals.  Replace white rice with brown rice, bulgur wheat, or millet.  Start the day with a breakfast that is high in fiber, such as a cereal that contains at least 5  grams of fiber per serving.  Use beans in place of meat in soups, salads, or pasta.  Eat high-fiber snacks, such as berries, raw vegetables, nuts, or popcorn. This information is not intended to replace  advice given to you by your health care provider. Make sure you discuss any questions you have with your health care provider. Document Released: 08/24/2005 Document Revised: 01/30/2016 Document Reviewed: 02/06/2014 Elsevier Interactive Patient Education  [employer].

## 2018-03-25 ENCOUNTER — Telehealth: Payer: Self-pay

## 2018-03-25 ENCOUNTER — Other Ambulatory Visit: Payer: Self-pay | Admitting: Nurse Practitioner

## 2018-03-25 ENCOUNTER — Encounter: Payer: Self-pay | Admitting: Surgery

## 2018-03-25 ENCOUNTER — Other Ambulatory Visit: Payer: Self-pay

## 2018-03-25 DIAGNOSIS — F411 Generalized anxiety disorder: Secondary | ICD-10-CM

## 2018-03-25 MED ORDER — ALPRAZOLAM 0.25 MG PO TABS: 0.5000 mg | ORAL_TABLET | Freq: Two times a day (BID) | ORAL | 3 refills | Status: DC | PRN

## 2018-03-25 MED ORDER — HYDRALAZINE HCL 25 MG PO TABS: 25.0000 mg | ORAL_TABLET | Freq: Two times a day (BID) | ORAL | 1 refills | Status: DC

## 2018-03-25 NOTE — Progress Notes (Signed)
Sent new rx to pillpack for refill.

## 2018-03-25 NOTE — Telephone Encounter (Signed)
PT WAS NOTIFED.

## 2018-03-25 NOTE — Telephone Encounter (Signed)
Sent new rx to pillpack for refill.

## 2018-03-25 NOTE — Telephone Encounter (Signed)
PT NEEDS THIS SENT TO [facility] BUT IT WAS PREVIOUSLY SENT TO [facility].

## 2018-03-25 NOTE — Telephone Encounter (Signed)
Can you check which pharmacy she needs this at? Looks like a new rx was sent 03/11/2018.

## 2018-03-30 DIAGNOSIS — Z0000 Encounter for general adult medical examination without abnormal findings: Secondary | ICD-10-CM | POA: Diagnosis not present

## 2018-04-04 ENCOUNTER — Encounter: Payer: Self-pay | Admitting: Internal Medicine

## 2018-04-05 ENCOUNTER — Ambulatory Visit: Payer: Self-pay | Admitting: Surgery

## 2018-04-06 ENCOUNTER — Telehealth: Payer: Self-pay

## 2018-04-06 NOTE — Telephone Encounter (Signed)
PT CALLED SAYING THAT SHE HAS A COLOGUARD DONE AND WAS TOLD IT WAS NEGATIVE. THIS WAS DONE BY HER INSURANCE AND THEY INFORMED HER OF HER RESULTS. PT WANTED TO KNOW WHAT WE ARE GOING TO DO BUT WE ARE NOT ABLE TO DO ANYTHING BECAUSE WE HAVE NOT GOTTEN THE RESULTS. SHE SPOKE WITH INSURANCE COMPANY AND THEY SAID THEY WILL FAX THE RESULTS WITHIN 7-10 DAYS. PT WAS NOTIFIED THAT WE WILL THEN PROCEED WITH A REFERRAL TO GI.

## 2018-04-08 ENCOUNTER — Other Ambulatory Visit: Payer: Self-pay

## 2018-04-08 DIAGNOSIS — F5101 Primary insomnia: Secondary | ICD-10-CM

## 2018-04-08 DIAGNOSIS — I712 Thoracic aortic aneurysm, without rupture: Secondary | ICD-10-CM | POA: Diagnosis not present

## 2018-04-08 DIAGNOSIS — Z952 Presence of prosthetic heart valve: Secondary | ICD-10-CM | POA: Diagnosis not present

## 2018-04-08 DIAGNOSIS — Q231 Congenital insufficiency of aortic valve: Secondary | ICD-10-CM | POA: Diagnosis not present

## 2018-04-08 DIAGNOSIS — Z8673 Personal history of transient ischemic attack (TIA), and cerebral infarction without residual deficits: Secondary | ICD-10-CM | POA: Insufficient documentation

## 2018-04-08 DIAGNOSIS — K8012 Calculus of gallbladder with acute and chronic cholecystitis without obstruction: Secondary | ICD-10-CM | POA: Diagnosis not present

## 2018-04-08 MED ORDER — TRAZODONE HCL 50 MG PO TABS: 100.0000 mg | ORAL_TABLET | Freq: Every day | ORAL | 1 refills | Status: DC

## 2018-04-12 ENCOUNTER — Ambulatory Visit: Payer: Medicare Other | Admitting: Nurse Practitioner

## 2018-04-12 ENCOUNTER — Encounter: Payer: Self-pay | Admitting: Nurse Practitioner

## 2018-04-12 VITALS — BP 138/78 | HR 78 | Resp 16 | Ht 60.0 in | Wt 181.0 lb

## 2018-04-12 DIAGNOSIS — R195 Other fecal abnormalities: Secondary | ICD-10-CM | POA: Diagnosis not present

## 2018-04-12 DIAGNOSIS — J449 Chronic obstructive pulmonary disease, unspecified: Secondary | ICD-10-CM | POA: Diagnosis not present

## 2018-04-12 DIAGNOSIS — I480 Paroxysmal atrial fibrillation: Secondary | ICD-10-CM | POA: Diagnosis not present

## 2018-04-12 NOTE — Progress Notes (Signed)
[facility] [place], [place] [address]  Internal MEDICINE  Office Visit Note  Patient Name: [name]  [id number]  [id number]  Date of Service: 04/24/2018   Pt is here for a sick visit.  Chief Complaint  Patient presents with  . Labs Only    cologuard results and referral to GI     The patient had positive coloGuard test which was given to her by her insurance company. She states that she has been told she cannot have colonoscopy because she had aortic valve replacement and it was too dangerous to put her to sleep.        Current Medication:  Outpatient Encounter Medications as of 04/12/2018  Medication Sig  . acetaminophen (TYLENOL) 500 MG tablet Take 500 mg by mouth 2 (two) times daily as needed for moderate pain or headache.  . albuterol (PROVENTIL HFA;VENTOLIN HFA) 108 (90 Base) MCG/ACT inhaler Inhale 2 puffs into the lungs every 6 (six) hours as needed for wheezing or shortness of breath.  [name] albuterol-ipratropium (COMBIVENT) 18-103 MCG/ACT inhaler Inhale 1 puff into the lungs 3 (three) times daily as needed for wheezing or shortness of breath.  . ALPRAZolam (XANAX) 0.25 MG tablet Take 2 tablets (0.5 mg total) by mouth 2 (two) times daily as needed for anxiety.  Darlin Priestly HFA 200 MCG/ACT AERO   . atorvastatin (LIPITOR) 40 MG tablet 1 tab po qhs  . Coenzyme Q10 (COQ10) 200 MG CAPS Take 200 mg by mouth daily.  . diclofenac sodium (VOLTAREN) 1 % GEL To the affected joint  . diltiazem (CARDIZEM CD) 120 MG 24 hr capsule Take 1 capsule (120 mg total) by mouth daily.  [name] diltiazem (TIAZAC) 180 MG 24 hr capsule Take by mouth daily.  . DULoxetine (CYMBALTA) 60 MG capsule Take 1 capsule (60 mg total) by mouth 2 (two) times daily.  [name] ELIQUIS 5 MG TABS tablet Take 1 tablet (5 mg total) by mouth 2 (two) times daily.  . ferrous sulfate (FERROUSUL) 325 (65 FE) MG tablet Take 325 mg by mouth daily with breakfast.  . flecainide (TAMBOCOR) 50 MG tablet Take 1  tablet (50 mg total) by mouth 2 (two) times daily.  . hydrALAZINE (APRESOLINE) 25 MG tablet Take 1 tablet (25 mg total) by mouth 2 (two) times daily.  [name] Ketotifen Fumarate (ALAWAY OP) Apply 1 drop to eye daily as needed (allergies).  [name] levothyroxine (SYNTHROID, LEVOTHROID) 25 MCG tablet Take 1 tablet (25 mcg total) by mouth daily before breakfast.  . lisinopril-hydrochlorothiazide (PRINZIDE,ZESTORETIC) 20-25 MG tablet Take 1 tablet by mouth daily.  [name] loratadine (CLARITIN) 10 MG tablet Take 10 mg by mouth daily.  . mometasone (ASMANEX) 220 MCG/INH inhaler Inhale 1 puff into the lungs 2 (two) times daily.  [name] neomycin-polymyxin b-dexamethasone (MAXITROL) 3.5-10000-0.1 SUSP INSTILL 1 DROP(S) IN LEFT EYE 3 TIMES A DAY  . Omega-3 1000 MG CAPS Take by mouth.  . OXYGEN Inhale into the lungs.  . pantoprazole (PROTONIX) 40 MG tablet Take 1 tablet (40 mg total) by mouth daily.  . potassium chloride (K-DUR,KLOR-CON) 10 MEQ tablet Take 10 mEq by mouth 3 (three) times daily.   . potassium chloride (MICRO-K) 10 MEQ CR capsule take 1 capsule po TID  . propranolol (INDERAL) 10 MG tablet Take by mouth.  . sodium chloride (OCEAN) 0.65 % SOLN nasal spray Place 1 spray into both nostrils daily as needed for congestion.  . traZODone (DESYREL) 50 MG tablet Take 2 tablets (100 mg total)  by mouth at bedtime.   No facility-administered encounter medications on file as of 04/12/2018.       Medical History: Past Medical History:  Diagnosis Date  . 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 s/p bioprosthetic valve replacement 2008 at [facility];  b. 01/2015 Echo: EF 60-65%, no rwma, Gr1 DD, mildly dil LA, nl RV fxn.  . Atrial fibrillation with RVR ([place]) 01/11/2015  . Atrial flutter ([place])    a. 08/2016 s/p DCCV.  Remains on flecainide 50 mg bid.  . Basal skull fracture ([place]) 20 yrs ago  . Chronic respiratory failure ([place])   . COPD (chronic obstructive pulmonary disease) ([place])    a. on home O2 at 2L since 2008  .  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 Essential hypertension 01/24/2015  . History of cardiac cath    a. 2008 prior to Aortic aneurysm repair-->nl cors.  . History of stress test    a. 10/2015 MV: no ischemia/infarct.  [name] HLD (hyperlipidemia)   . HTN (hypertension)   . Hypothyroidism   . Obesity   . PAF (paroxysmal atrial fibrillation) ([name])    a. on Eliquis; b. CHADS2VASc = 3 (HTN, age x 1, female).  . Paroxysmal atrial fibrillation ([place]) 01/24/2015  . Right upper quadrant abdominal tenderness without rebound tenderness 03/02/2018  . S/P ascending aortic aneurysm repair 2008     Today's Vitals   04/12/18 1043 04/12/18 1044  BP: 138/78   Pulse: 78   Resp: 16   SpO2: 93% 93%  Weight: 181 lb (82.1 kg)   Height: 5' (1.524 m)     Review of Systems  Constitutional: Positive for fatigue.  HENT: Negative for postnasal drip, rhinorrhea, sinus pressure, sinus pain, sore throat and voice change.   Eyes: Negative.   Respiratory: Positive for shortness of breath.   Cardiovascular: Positive for chest pain and palpitations.  Gastrointestinal: Negative for diarrhea, nausea, rectal pain and vomiting.       Per patient, she was contacted by her insurance company and told that she had a positive cologuard test.  Endocrine:       Thyroid problems   Genitourinary: Positive for flank pain. Negative for frequency, hematuria and urgency.  Musculoskeletal: Positive for arthralgias, back pain, myalgias and neck pain.  Skin: Negative for rash.  Allergic/Immunologic: Negative.   Neurological: Positive for headaches.  Hematological: Negative.   Psychiatric/Behavioral: Positive for agitation. The patient is nervous/anxious.     Physical Exam  Constitutional: She is oriented to person, place, and time. She appears well-developed and well-nourished.  HENT:  Head: Normocephalic and atraumatic.  Nose: Nose normal.  Mouth/Throat: Oropharynx is clear and moist. No oropharyngeal  exudate.  Eyes: Pupils are equal, round, and reactive to light. Conjunctivae are normal.  Neck: Normal range of motion. Neck supple. No JVD present. No tracheal deviation present. No thyromegaly present.  Cardiovascular: Normal rate. An irregular rhythm present.  Murmur heard. Pulmonary/Chest: Effort normal and breath sounds normal. She has no rhonchi. She has no rales.  The patient is on nsal cannula oxygen at all times.   Abdominal: Soft. Bowel sounds are normal. There is no tenderness.  Genitourinary:  Genitourinary Comments: Urine sample is without abnormality. She does have some right sided flank pain with palpation.   Musculoskeletal: Normal range of motion.  Lymphadenopathy:    She has no cervical adenopathy.  Neurological: She is alert and oriented to person, place,  and time.  Skin: Skin is warm and dry.  Psychiatric: Her speech is normal and behavior is normal. Judgment and thought content normal. Her mood appears anxious. Cognition and memory are normal. She exhibits a depressed mood.  Nursing note and vitals reviewed.  Assessment/Plan: 1. 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 exacerbation ([name]) Stable. Continue inhalers and respiratory medications as prescribed. Use oxygen per nasal cannula at all times.   2. Paroxysmal atrial fibrillation ([name]) Continue all medications as prescribed. Regular visits with cardiology as scheduled.   3. Positive colorectal cancer screening using Cologuard test reffer to [facility] for further evaluation and treatment of positive ColoGuard test. - Ambulatory referral to Gastroenterology  General Counseling: Jilene verbalizes understanding of the findings of todays visit and agrees with plan of treatment. I have discussed any further diagnostic evaluation that may be needed or ordered today. We also reviewed her medications today. she has been encouraged to call the office with any questions or concerns that should arise related to todays  visit.    Counseling:  This patient was seen by Leretha Pol FNP Collaboration with Dr [name] as a part of collaborative care agreement  Orders Placed This Encounter  Procedures  . Ambulatory referral to Gastroenterology      Time spent: 15 Minutes

## 2018-04-24 DIAGNOSIS — R195 Other fecal abnormalities: Secondary | ICD-10-CM | POA: Insufficient documentation

## 2018-04-25 DIAGNOSIS — K8020 Calculus of gallbladder without cholecystitis without obstruction: Secondary | ICD-10-CM | POA: Diagnosis not present

## 2018-04-26 ENCOUNTER — Telehealth: Payer: Self-pay

## 2018-04-26 ENCOUNTER — Ambulatory Visit: Payer: Self-pay | Admitting: Surgery

## 2018-04-26 NOTE — Telephone Encounter (Signed)
Attempted to call patient to reschedule her PFT on 8/28 to 9/25 per the provider, but there was no answer and no voicemail set up

## 2018-05-03 ENCOUNTER — Ambulatory Visit: Payer: Self-pay | Admitting: Surgery

## 2018-05-03 DIAGNOSIS — R05 Cough: Secondary | ICD-10-CM | POA: Diagnosis not present

## 2018-05-03 DIAGNOSIS — J432 Centrilobular emphysema: Secondary | ICD-10-CM | POA: Diagnosis not present

## 2018-05-03 DIAGNOSIS — J449 Chronic obstructive pulmonary disease, unspecified: Secondary | ICD-10-CM | POA: Diagnosis not present

## 2018-05-03 DIAGNOSIS — Z01818 Encounter for other preprocedural examination: Secondary | ICD-10-CM | POA: Diagnosis not present

## 2018-05-04 ENCOUNTER — Ambulatory Visit: Payer: Self-pay | Admitting: Internal Medicine

## 2018-05-05 DIAGNOSIS — Q231 Congenital insufficiency of aortic valve: Secondary | ICD-10-CM | POA: Diagnosis not present

## 2018-05-05 DIAGNOSIS — I10 Essential (primary) hypertension: Secondary | ICD-10-CM | POA: Diagnosis not present

## 2018-05-05 DIAGNOSIS — I712 Thoracic aortic aneurysm, without rupture: Secondary | ICD-10-CM | POA: Diagnosis not present

## 2018-05-05 DIAGNOSIS — I480 Paroxysmal atrial fibrillation: Secondary | ICD-10-CM | POA: Diagnosis not present

## 2018-05-09 ENCOUNTER — Other Ambulatory Visit: Payer: Self-pay | Admitting: Cardiovascular Disease

## 2018-05-10 ENCOUNTER — Telehealth: Payer: Self-pay | Admitting: Cardiovascular Disease

## 2018-05-10 NOTE — Telephone Encounter (Signed)
Patient with diagnosis of Afib on Eliquis for anticoagulation.    Procedure: colonoscopy Date of procedure: 05/24/18  CHADS2-VASc score of  4 (CHF, HTN, AGE, DM2, stroke/tia x 2, CAD, AGE, female)  CrCl 12ml/min  Per office protocol, patient can hold Eliquis for 24 hours prior to procedure.

## 2018-05-10 NOTE — Telephone Encounter (Signed)
Patient is currently on Eliquis and needs to know if this can be held.

## 2018-05-10 NOTE — Telephone Encounter (Signed)
Patient has been made aware to hold the Eliquis 24 hours prior.

## 2018-05-10 NOTE — Telephone Encounter (Signed)
° °  [facility] Pre-operative Risk Assessment    Request for surgical clearance:  1. What type of surgery is being performed?  Colonoscopy   2. When is this surgery scheduled? 9/17  3. What type of clearance is required (medical clearance vs. Pharmacy clearance to hold med vs. Both)? Pharmacy   4. Are there any medications that need to be held prior to surgery and how long? Please advise per patient    5. Practice name and name of physician performing surgery?  [facility] Dr. Grace Isaac   6. What is your office phone number unknown    7.   What is your office fax number unknown   8.   Anesthesia type (None, local, MAC, general) ? Unknown    Clarisse Gouge 05/10/2018, 2:28 PM  _________________________________________________________________   (provider comments below)

## 2018-05-11 ENCOUNTER — Ambulatory Visit: Payer: Self-pay | Admitting: Internal Medicine

## 2018-05-12 DIAGNOSIS — R195 Other fecal abnormalities: Secondary | ICD-10-CM | POA: Diagnosis not present

## 2018-05-12 DIAGNOSIS — I10 Essential (primary) hypertension: Secondary | ICD-10-CM | POA: Diagnosis not present

## 2018-05-12 NOTE — Telephone Encounter (Signed)
Low to moderate risk.  No need to be seen.

## 2018-05-18 ENCOUNTER — Ambulatory Visit: Payer: Self-pay

## 2018-05-20 ENCOUNTER — Encounter: Payer: Self-pay | Admitting: Nurse Practitioner

## 2018-05-24 ENCOUNTER — Ambulatory Visit: Payer: Self-pay | Admitting: Internal Medicine

## 2018-05-24 DIAGNOSIS — K449 Diaphragmatic hernia without obstruction or gangrene: Secondary | ICD-10-CM | POA: Diagnosis not present

## 2018-05-24 DIAGNOSIS — Z79899 Other long term (current) drug therapy: Secondary | ICD-10-CM | POA: Diagnosis not present

## 2018-05-24 DIAGNOSIS — D123 Benign neoplasm of transverse colon: Secondary | ICD-10-CM | POA: Diagnosis not present

## 2018-05-24 DIAGNOSIS — I10 Essential (primary) hypertension: Secondary | ICD-10-CM | POA: Diagnosis not present

## 2018-05-24 DIAGNOSIS — Z8673 Personal history of transient ischemic attack (TIA), and cerebral infarction without residual deficits: Secondary | ICD-10-CM | POA: Diagnosis not present

## 2018-05-24 DIAGNOSIS — K635 Polyp of colon: Secondary | ICD-10-CM | POA: Diagnosis not present

## 2018-05-24 DIAGNOSIS — K6389 Other specified diseases of intestine: Secondary | ICD-10-CM | POA: Diagnosis not present

## 2018-05-24 DIAGNOSIS — D126 Benign neoplasm of colon, unspecified: Secondary | ICD-10-CM | POA: Diagnosis not present

## 2018-05-24 DIAGNOSIS — J449 Chronic obstructive pulmonary disease, unspecified: Secondary | ICD-10-CM | POA: Diagnosis not present

## 2018-05-24 DIAGNOSIS — D120 Benign neoplasm of cecum: Secondary | ICD-10-CM | POA: Diagnosis not present

## 2018-05-24 DIAGNOSIS — R1013 Epigastric pain: Secondary | ICD-10-CM | POA: Diagnosis not present

## 2018-05-24 DIAGNOSIS — K3189 Other diseases of stomach and duodenum: Secondary | ICD-10-CM | POA: Diagnosis not present

## 2018-05-24 DIAGNOSIS — I4891 Unspecified atrial fibrillation: Secondary | ICD-10-CM | POA: Diagnosis not present

## 2018-05-24 DIAGNOSIS — E785 Hyperlipidemia, unspecified: Secondary | ICD-10-CM | POA: Diagnosis not present

## 2018-05-24 DIAGNOSIS — Z7901 Long term (current) use of anticoagulants: Secondary | ICD-10-CM | POA: Diagnosis not present

## 2018-05-24 NOTE — Telephone Encounter (Signed)
Pt c/o medication issue:  1. Name of Medication: Eliquis   2. How are you currently taking this medication (dosage and times per day)? 5 mg po BID   3. Are you having a reaction (difficulty breathing--STAT)? No   4. What is your medication issue? Patient was told by Dr. [facility] to hold s/p colonoscopy for 2 more days   Patient calling to make sure this is ok

## 2018-05-31 ENCOUNTER — Telehealth: Payer: Self-pay | Admitting: Cardiovascular Disease

## 2018-05-31 NOTE — Telephone Encounter (Signed)
Pharmacy list updated and [facility] made the favorite.

## 2018-05-31 NOTE — Telephone Encounter (Signed)
Patient calling to make offices aware Patient has been using [facility]  No longer would like for [place] to call [facility] for any medication refills

## 2018-06-01 ENCOUNTER — Ambulatory Visit: Payer: Self-pay | Admitting: Internal Medicine

## 2018-06-08 ENCOUNTER — Other Ambulatory Visit: Payer: Self-pay | Admitting: Cardiovascular Disease

## 2018-06-08 ENCOUNTER — Other Ambulatory Visit: Payer: Self-pay

## 2018-06-08 DIAGNOSIS — F5101 Primary insomnia: Secondary | ICD-10-CM

## 2018-06-08 MED ORDER — ATORVASTATIN CALCIUM 40 MG PO TABS: ORAL_TABLET | ORAL | 4 refills | Status: DC

## 2018-06-08 MED ORDER — DULOXETINE HCL 60 MG PO CPEP: 60.0000 mg | ORAL_CAPSULE | Freq: Two times a day (BID) | ORAL | 1 refills | Status: DC

## 2018-06-08 MED ORDER — TRAZODONE HCL 50 MG PO TABS: 100.0000 mg | ORAL_TABLET | Freq: Every day | ORAL | 1 refills | Status: DC

## 2018-06-08 NOTE — Telephone Encounter (Signed)
Refill Request.  

## 2018-06-09 ENCOUNTER — Encounter: Payer: Self-pay | Admitting: Internal Medicine

## 2018-06-09 ENCOUNTER — Telehealth: Payer: Self-pay

## 2018-06-09 ENCOUNTER — Ambulatory Visit: Payer: Medicare Other | Admitting: Internal Medicine

## 2018-06-09 VITALS — BP 148/74 | HR 84 | Resp 18 | Ht <= 58 in | Wt 181.0 lb

## 2018-06-09 DIAGNOSIS — Z9981 Dependence on supplemental oxygen: Secondary | ICD-10-CM

## 2018-06-09 DIAGNOSIS — J449 Chronic obstructive pulmonary disease, unspecified: Secondary | ICD-10-CM

## 2018-06-09 DIAGNOSIS — R0602 Shortness of breath: Secondary | ICD-10-CM | POA: Diagnosis not present

## 2018-06-09 DIAGNOSIS — I480 Paroxysmal atrial fibrillation: Secondary | ICD-10-CM | POA: Diagnosis not present

## 2018-06-09 DIAGNOSIS — G4733 Obstructive sleep apnea (adult) (pediatric): Secondary | ICD-10-CM | POA: Diagnosis not present

## 2018-06-09 NOTE — Telephone Encounter (Signed)
Called pill pack and cancelled the refill orders for trazadone 50mg  , deluxotine 60mg , and potassium chloride (Micro-K10 MEQ CR capsules, there was some confusion on the pt correct directions, was informed by [facility] that the pt called and informed pill pack that she is taking the prescriptions listed above differently, and the pt changed the directions of how to take and how many herself without th approval of any phsycian

## 2018-06-09 NOTE — Progress Notes (Signed)
[facility] [place], [place] [address]  Pulmonary Sleep Medicine   Office Visit Note  Patient Name: [name] DOB: Dec 23, 1945 MRN [id number]  Date of Service: 06/09/2018  Complaints/HPI: Patient is doing relatively well.  She was seen at [facility] because of her GI issues and was referred to pulmonary for evaluation.  She states they recommended changing her inhalers however she is not able to afford the inhaler that was recommended.  She denies having any cough no congestion noted at this time.  She denies having any chest pain.  She is in no admission to the hospital.  She continues to use her oxygen as prescribed.  ROS  General: (-) fever, (-) chills, (-) night sweats, (-) weakness Skin: (-) rashes, (-) itching,. Eyes: (-) visual changes, (-) redness, (-) itching. Nose and Sinuses: (-) nasal stuffiness or itchiness, (-) postnasal drip, (-) nosebleeds, (-) sinus trouble. Mouth and Throat: (-) sore throat, (-) hoarseness. Neck: (-) swollen glands, (-) enlarged thyroid, (-) neck pain. Respiratory: - cough, (-) bloody sputum, - shortness of breath, - wheezing. Cardiovascular: - ankle swelling, (-) chest pain. Lymphatic: (-) lymph node enlargement. Neurologic: (-) numbness, (-) tingling. Psychiatric: (-) anxiety, (-) depression   Current Medication: Outpatient Encounter Medications as of 06/09/2018  Medication Sig  . acetaminophen (TYLENOL) 500 MG tablet Take 500 mg by mouth 2 (two) times daily as needed for moderate pain or headache.  . albuterol (PROVENTIL HFA;VENTOLIN HFA) 108 (90 Base) MCG/ACT inhaler Inhale 2 puffs into the lungs every 6 (six) hours as needed for wheezing or shortness of breath.  [name] albuterol-ipratropium (COMBIVENT) 18-103 MCG/ACT inhaler Inhale 1 puff into the lungs 3 (three) times daily as needed for wheezing or shortness of breath.  . ALPRAZolam (XANAX) 0.25 MG tablet Take 2 tablets (0.5 mg total) by mouth 2 (two) times daily as  needed for anxiety. (Patient taking differently: Take 0.5 mg by mouth daily. )  . ASMANEX HFA 200 MCG/ACT AERO   . atorvastatin (LIPITOR) 40 MG tablet 1 tab po qhs  . Coenzyme Q10 (COQ10) 200 MG CAPS Take 200 mg by mouth daily.  . diclofenac sodium (VOLTAREN) 1 % GEL To the affected joint  . diltiazem (TIAZAC) 180 MG 24 hr capsule Take by mouth daily.  . DULoxetine (CYMBALTA) 60 MG capsule Take 1 capsule (60 mg total) by mouth 2 (two) times daily. (Patient taking differently: Take 60 mg by mouth daily. )  . ELIQUIS 5 MG TABS tablet Take 1 tablet (5 mg total) by mouth 2 (two) times daily.  . ferrous sulfate (FERROUSUL) 325 (65 FE) MG tablet Take 325 mg by mouth daily with breakfast.  . flecainide (TAMBOCOR) 50 MG tablet Take 1 tablet (50 mg total) by mouth 2 (two) times daily.  . hydrALAZINE (APRESOLINE) 25 MG tablet Take 1 tablet (25 mg total) by mouth 2 (two) times daily.  [name] Ketotifen Fumarate (ALAWAY OP) Apply 1 drop to eye daily as needed (allergies).  [name] levothyroxine (SYNTHROID, LEVOTHROID) 25 MCG tablet Take 1 tablet (25 mcg total) by mouth daily before breakfast.  . lisinopril-hydrochlorothiazide (PRINZIDE,ZESTORETIC) 20-25 MG tablet Take 1 tablet by mouth daily.  [name] loratadine (CLARITIN) 10 MG tablet Take 10 mg by mouth daily.  . mometasone (ASMANEX) 220 MCG/INH inhaler Inhale 1 puff into the lungs 2 (two) times daily.  [name] neomycin-polymyxin b-dexamethasone (MAXITROL) 3.5-10000-0.1 SUSP INSTILL 1 DROP(S) IN LEFT EYE 3 TIMES A DAY  . Omega-3 1000 MG CAPS Take by mouth.  [name]  OXYGEN Inhale into the lungs.  . pantoprazole (PROTONIX) 40 MG tablet Take 1 tablet (40 mg total) by mouth daily.  . potassium chloride (MICRO-K) 10 MEQ CR capsule take 1 capsule po TID (Patient taking differently: 20 mEq. Take 2 in am and 2 tab at pm and pts gets by other  MD)  . propranolol (INDERAL) 10 MG tablet Take by mouth.  . sodium chloride (OCEAN) 0.65 % SOLN nasal spray Place 1 spray into both nostrils daily as  needed for congestion.  . traZODone (DESYREL) 50 MG tablet Take 2 tablets (100 mg total) by mouth at bedtime. (Patient taking differently: Take 50 mg by mouth at bedtime. )  . diltiazem (CARDIZEM CD) 120 MG 24 hr capsule Take 1 capsule (120 mg total) by mouth daily.   No facility-administered encounter medications on file as of 06/09/2018.     Surgical History: Past Surgical History:  Procedure Laterality Date  . ABDOMINAL AORTIC ANEURYSM REPAIR  2008  . ABDOMINAL HYSTERECTOMY    . AORTIC VALVE REPLACEMENT  2008  . CARDIAC CATHETERIZATION     [facility]  . CARPAL TUNNEL RELEASE    . ELECTROPHYSIOLOGIC STUDY N/A 08/17/2016   Procedure: Cardioversion;  Surgeon: [name], MD;  Location: [facility] ORS;  Service: Cardiovascular;  Laterality: N/A;  . TUMOR EXCISION Left    x3 (arm)    Medical History: Past Medical History:  Diagnosis Date  . 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 s/p bioprosthetic valve replacement 2008 [facility];  b. 01/2015 Echo: EF 60-65%, no rwma, Gr1 DD, mildly dil LA, nl RV fxn.  . Atrial fibrillation with RVR ([place]) 01/11/2015  . Atrial flutter ([place])    a. 08/2016 s/p DCCV.  Remains on flecainide 50 mg bid.  . Basal skull fracture ([place]) 20 yrs ago  . Chronic respiratory failure ([place])   . COPD (chronic obstructive pulmonary disease) ([place])    a. on home O2 at 2L since 2008  . 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 Essential hypertension 01/24/2015  . History of cardiac cath    a. 2008 prior to Aortic aneurysm repair-->nl cors.  . History of stress test    a. 10/2015 MV: no ischemia/infarct.  [name] HLD (hyperlipidemia)   . HTN (hypertension)   . Hypothyroidism   . Obesity   . PAF (paroxysmal atrial fibrillation) ([name])    a. on Eliquis; b. CHADS2VASc = 3 (HTN, age x 1, female).  . Paroxysmal atrial fibrillation ([place]) 01/24/2015  . Right upper quadrant abdominal tenderness without rebound tenderness 03/02/2018  . S/P ascending aortic  aneurysm repair 2008    Family History: Family History  Problem Relation Age of Onset  . Stroke Father   . Stroke Paternal Grandmother     Social History: Social History   Socioeconomic History  . Marital status: Divorced    Spouse name: Not on file  . Number of children: Not on file  . Years of education: Not on file  . Highest education level: Not on file  Occupational History  . Not on file  Social Needs  . Financial resource strain: Not on file  . Food insecurity:    Worry: Not on file    Inability: Not on file  . Transportation needs:    Medical: Not on file    Non-medical: Not on file  Tobacco Use  . Smoking status: Former Research scientist (life sciences)  . Smokeless tobacco:  Never Used  Substance and Sexual Activity  . Alcohol use: No  . Drug use: No  . Sexual activity: Never    Birth control/protection: Surgical  Lifestyle  . Physical activity:    Days per week: Not on file    Minutes per session: Not on file  . Stress: Not on file  Relationships  . Social connections:    Talks on phone: Not on file    Gets together: Not on file    Attends religious service: Not on file    Active member of club or organization: Not on file    Attends meetings of clubs or organizations: Not on file    Relationship status: Not on file  . Intimate partner violence:    Fear of current or ex partner: Not on file    Emotionally abused: Not on file    Physically abused: Not on file    Forced sexual activity: Not on file  Other Topics Concern  . Not on file  Social History Narrative  . Not on file    Vital Signs: Blood pressure (!) 148/74, pulse 84, resp. rate 18, height 4\' 10"  (1.473 m), weight 181 lb (82.1 kg), SpO2 95 %.  Examination: General Appearance: The patient is well-developed, well-nourished, and in no distress. Skin: Gross inspection of skin unremarkable. Head: normocephalic, no gross deformities. Eyes: no gross deformities noted. ENT: ears appear grossly normal no  exudates. Neck: Supple. No thyromegaly. No LAD. Respiratory: few distant rhinchi noted. Cardiovascular: Normal S1 and S2 without murmur or rub. Extremities: No cyanosis. pulses are equal. Neurologic: Alert and oriented. No involuntary movements.  LABS: No results found for this or any previous visit (from the past 2160 hour(s)).  Radiology: US Abdomen Limited Ruq  Result Date: 03/09/2018 CLINICAL DATA:  72 year old female with a history of upper abdominal pain for 1 week EXAM: ULTRASOUND ABDOMEN LIMITED RIGHT UPPER QUADRANT COMPARISON:  CT 02/12/2006, ultrasound 11/06/2011 FINDINGS: Gallbladder: Hyperechoic material layered at the posterior aspect of the gallbladder. No gallbladder wall thickening. No pericholecystic fluid. Negative sonographic Murphy's sign. Common bile duct: Diameter: 3 mm Liver: Heterogeneous appearance of the liver parenchyma. Portal vein is patent on color Doppler imaging with normal direction of blood flow towards the liver. IMPRESSION: Cholelithiasis without sonographic evidence of acute cholecystitis. Appearance of the liver is suggestive of steatosis. Electronically Signed   By: Corrie Mckusick D.O.   On: 03/09/2018 11:17    No results found.  No results found.    Assessment and Plan: Patient Active Problem List   Diagnosis Date Noted  . Positive colorectal cancer screening using Cologuard test 04/24/2018  . Dysuria 03/02/2018  . Urinary tract infection without hematuria 03/02/2018  . Right upper quadrant abdominal tenderness without rebound tenderness 03/02/2018  . 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 exacerbation ([place]) 03/02/2018  . COPD (chronic obstructive pulmonary disease) ([place]) 09/19/2017  . Typical atrial flutter ([place])   . Irritable bowel syndrome without diarrhea 01/29/2015  . Essential hypertension 01/24/2015  . Paroxysmal atrial fibrillation ([place]) 01/24/2015  . History of aortic valve replacement with bioprosthetic valve 01/24/2015  . Atrial  fibrillation with RVR ([place]) 01/11/2015  . 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 Impingement syndrome of shoulder 05/18/2014  . Cellulitis and abscess 02/13/2014  . MRSA (methicillin resistant Staphylococcus aureus) 02/13/2014  . Aneurysm, ascending aorta ([place]) 06/23/2013  . Bicuspid aortic valve 06/23/2013    1. COPD patient was seen at [facility] preop evaluation. Patient was switched over to  [place]. Patient states that it is very expensive and she is not able to afford the medication.  I gave her some samples and we will discuss the possibility of patient assistance I spoke with my staff to try to get her some patient assistance 2. OSA on CPAP therapy she will continue with CPAP as ordered 3. PAF stable rythym at this time rate has been controlled 4. Oxygen dependent she is on 2lpm this will be continued she is using the oxygen and gaining benefit from it 5. Shortness of breath she will have spirometry evaluated  General Counseling: I have discussed the findings of the evaluation and examination with Chevy.  I have also discussed any further diagnostic evaluation thatmay be needed or ordered today. Imanni verbalizes understanding of the findings of todays visit. We also reviewed her medications today and discussed drug interactions and side effects including but not limited excessive drowsiness and altered mental states. We also discussed that there is always a risk not just to her but also people around her. she has been encouraged to call the office with any questions or concerns that should arise related to todays visit.    Time spent: 71min  I have personally obtained a history, examined the patient, evaluated laboratory and imaging results, formulated the assessment and plan and placed orders.    Allyne Gee, MD [facility] Pulmonary and Critical Care Sleep medicine

## 2018-06-10 ENCOUNTER — Ambulatory Visit (INDEPENDENT_AMBULATORY_CARE_PROVIDER_SITE_OTHER): Payer: Medicare Other

## 2018-06-10 DIAGNOSIS — Z23 Encounter for immunization: Secondary | ICD-10-CM | POA: Diagnosis not present

## 2018-06-10 NOTE — Progress Notes (Signed)
flu

## 2018-06-12 ENCOUNTER — Encounter: Payer: Self-pay | Admitting: Internal Medicine

## 2018-06-17 DIAGNOSIS — H109 Unspecified conjunctivitis: Secondary | ICD-10-CM | POA: Diagnosis not present

## 2018-06-20 DIAGNOSIS — Z1231 Encounter for screening mammogram for malignant neoplasm of breast: Secondary | ICD-10-CM | POA: Diagnosis not present

## 2018-06-21 DIAGNOSIS — G4733 Obstructive sleep apnea (adult) (pediatric): Secondary | ICD-10-CM | POA: Diagnosis not present

## 2018-06-27 DIAGNOSIS — K8020 Calculus of gallbladder without cholecystitis without obstruction: Secondary | ICD-10-CM | POA: Diagnosis not present

## 2018-06-28 DIAGNOSIS — H109 Unspecified conjunctivitis: Secondary | ICD-10-CM | POA: Diagnosis not present

## 2018-07-07 ENCOUNTER — Encounter: Payer: Self-pay | Admitting: Nurse Practitioner

## 2018-07-07 ENCOUNTER — Other Ambulatory Visit: Payer: Self-pay | Admitting: Nurse Practitioner

## 2018-07-07 ENCOUNTER — Telehealth: Payer: Self-pay

## 2018-07-07 ENCOUNTER — Ambulatory Visit: Payer: Medicare Other | Admitting: Nurse Practitioner

## 2018-07-07 VITALS — BP 163/84 | HR 77 | Resp 16 | Ht <= 58 in | Wt 182.2 lb

## 2018-07-07 DIAGNOSIS — Z0001 Encounter for general adult medical examination with abnormal findings: Secondary | ICD-10-CM

## 2018-07-07 DIAGNOSIS — Z9981 Dependence on supplemental oxygen: Secondary | ICD-10-CM | POA: Diagnosis not present

## 2018-07-07 DIAGNOSIS — H1045 Other chronic allergic conjunctivitis: Secondary | ICD-10-CM

## 2018-07-07 DIAGNOSIS — I480 Paroxysmal atrial fibrillation: Secondary | ICD-10-CM

## 2018-07-07 DIAGNOSIS — J449 Chronic obstructive pulmonary disease, unspecified: Secondary | ICD-10-CM | POA: Diagnosis not present

## 2018-07-07 DIAGNOSIS — M25512 Pain in left shoulder: Secondary | ICD-10-CM

## 2018-07-07 DIAGNOSIS — G8929 Other chronic pain: Secondary | ICD-10-CM

## 2018-07-07 DIAGNOSIS — F411 Generalized anxiety disorder: Secondary | ICD-10-CM

## 2018-07-07 DIAGNOSIS — E2839 Other primary ovarian failure: Secondary | ICD-10-CM

## 2018-07-07 MED ORDER — ALPRAZOLAM 0.25 MG PO TABS: 0.5000 mg | ORAL_TABLET | Freq: Every evening | ORAL | 2 refills | Status: DC | PRN

## 2018-07-07 MED ORDER — DILTIAZEM HCL ER COATED BEADS 120 MG PO CP24: 120.0000 mg | ORAL_CAPSULE | Freq: Every day | ORAL | 6 refills | Status: DC

## 2018-07-07 NOTE — Progress Notes (Signed)
[facility] [place], [place] [address]  Internal MEDICINE  Office Visit Note  Patient Name: [name]  [id number]  [id number]  Date of Service: 07/09/2018   Pt is here for routine health maintenance examination   Chief Complaint  Patient presents with  . Medicare Wellness     The patient is here for health maintenance exam. She c/o left shoulder pain. Can hardly lift her arm up horizontally. Hurts to mover her arm forward or backward and strength is reduced.  She has had a lot of left eye itching and irritation. Was seen by opthamology and was given drops to help with inflammation. She states this is getting no better and is afraid this is being caused by allergies. She is interested in having allergy testing done.  She has had colonoscopy and endoscopy done since her last visit. She has also had mammogram. She is due to have bone density test.     Current Medication: Outpatient Encounter Medications as of 07/07/2018  Medication Sig  . acetaminophen (TYLENOL) 500 MG tablet Take 500 mg by mouth 2 (two) times daily as needed for moderate pain or headache.  . albuterol (PROVENTIL HFA;VENTOLIN HFA) 108 (90 Base) MCG/ACT inhaler Inhale 2 puffs into the lungs every 6 (six) hours as needed for wheezing or shortness of breath.  [name] albuterol-ipratropium (COMBIVENT) 18-103 MCG/ACT inhaler Inhale 1 puff into the lungs 3 (three) times daily as needed for wheezing or shortness of breath.  . ALPRAZolam (XANAX) 0.25 MG tablet Take 2 tablets (0.5 mg total) by mouth at bedtime as needed.  Jearl Klinefelter ELLIPTA 62.5-25 MCG/INH AEPB 1 Inhaler.  Darlin Priestly HFA 200 MCG/ACT AERO   . atorvastatin (LIPITOR) 40 MG tablet 1 tab po qhs  . Coenzyme Q10 (COQ10) 200 MG CAPS Take 200 mg by mouth daily.  . diclofenac sodium (VOLTAREN) 1 % GEL To the affected joint  . diltiazem (CARDIZEM CD) 120 MG 24 hr capsule Take 1 capsule (120 mg total) by mouth daily.  [name] diltiazem (TIAZAC) 180 MG 24  hr capsule Take by mouth daily.  . DULoxetine (CYMBALTA) 60 MG capsule Take 1 capsule (60 mg total) by mouth 2 (two) times daily. (Patient taking differently: Take 60 mg by mouth daily. )  . ELIQUIS 5 MG TABS tablet Take 1 tablet (5 mg total) by mouth 2 (two) times daily.  . ferrous sulfate (FERROUSUL) 325 (65 FE) MG tablet Take 325 mg by mouth daily with breakfast.  . flecainide (TAMBOCOR) 50 MG tablet Take 1 tablet (50 mg total) by mouth 2 (two) times daily.  . hydrALAZINE (APRESOLINE) 25 MG tablet Take 1 tablet (25 mg total) by mouth 2 (two) times daily.  [name] Ketotifen Fumarate (ALAWAY OP) Apply 1 drop to eye daily as needed (allergies).  [name] lisinopril-hydrochlorothiazide (PRINZIDE,ZESTORETIC) 20-25 MG tablet Take 1 tablet by mouth daily.  [name] loratadine (CLARITIN) 10 MG tablet Take 10 mg by mouth daily.  . mometasone (ASMANEX) 220 MCG/INH inhaler Inhale 1 puff into the lungs 2 (two) times daily.  [name] neomycin-polymyxin b-dexamethasone (MAXITROL) 3.5-10000-0.1 SUSP INSTILL 1 DROP(S) IN LEFT EYE 3 TIMES A DAY  . Omega-3 1000 MG CAPS Take by mouth.  . OXYGEN Inhale into the lungs.  . potassium chloride (MICRO-K) 10 MEQ CR capsule take 1 capsule po TID (Patient taking differently: 20 mEq. Take 2 in am and 2 tab at pm and pts gets by other  MD)  . propranolol (INDERAL) 10 MG tablet Take by  mouth.  . sodium chloride (OCEAN) 0.65 % SOLN nasal spray Place 1 spray into both nostrils daily as needed for congestion.  . traZODone (DESYREL) 50 MG tablet Take 2 tablets (100 mg total) by mouth at bedtime. (Patient taking differently: Take 50 mg by mouth at bedtime. )  . [DISCONTINUED] ALPRAZolam (XANAX) 0.25 MG tablet Take 2 tablets (0.5 mg total) by mouth 2 (two) times daily as needed for anxiety. (Patient taking differently: Take 0.5 mg by mouth daily. )  . [DISCONTINUED] levothyroxine (SYNTHROID, LEVOTHROID) 25 MCG tablet Take 1 tablet (25 mcg total) by mouth daily before breakfast.  . [DISCONTINUED]  pantoprazole (PROTONIX) 40 MG tablet Take 1 tablet (40 mg total) by mouth daily.  . [DISCONTINUED] diltiazem (CARDIZEM CD) 120 MG 24 hr capsule Take 1 capsule (120 mg total) by mouth daily.   No facility-administered encounter medications on file as of 07/07/2018.     Surgical History: Past Surgical History:  Procedure Laterality Date  . ABDOMINAL AORTIC ANEURYSM REPAIR  2008  . ABDOMINAL HYSTERECTOMY    . AORTIC VALVE REPLACEMENT  2008  . CARDIAC CATHETERIZATION     [facility]  . CARPAL TUNNEL RELEASE    . ELECTROPHYSIOLOGIC STUDY N/A 08/17/2016   Procedure: Cardioversion;  Surgeon: [name], MD;  Location: [facility] ORS;  Service: Cardiovascular;  Laterality: N/A;  . TUMOR EXCISION Left    x3 (arm)    Medical History: Past Medical History:  Diagnosis Date  . 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 s/p bioprosthetic valve replacement 2008 at [facility];  b. 01/2015 Echo: EF 60-65%, no rwma, Gr1 DD, mildly dil LA, nl RV fxn.  . Atrial fibrillation with RVR ([place]) 01/11/2015  . Atrial flutter ([place])    a. 08/2016 s/p DCCV.  Remains on flecainide 50 mg bid.  . Basal skull fracture ([place]) 20 yrs ago  . Chronic respiratory failure ([place])   . COPD (chronic obstructive pulmonary disease) ([place])    a. on home O2 at 2L since 2008  . 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 Essential hypertension 01/24/2015  . History of cardiac cath    a. 2008 prior to Aortic aneurysm repair-->nl cors.  . History of stress test    a. 10/2015 MV: no ischemia/infarct.  [name] HLD (hyperlipidemia)   . HTN (hypertension)   . Hypothyroidism   . Obesity   . PAF (paroxysmal atrial fibrillation) ([name])    a. on Eliquis; b. CHADS2VASc = 3 (HTN, age x 1, female).  . Paroxysmal atrial fibrillation ([place]) 01/24/2015  . Right upper quadrant abdominal tenderness without rebound tenderness 03/02/2018  . S/P ascending aortic aneurysm repair 2008    Family History: Family History  Problem Relation Age  of Onset  . Stroke Father   . Stroke Paternal Grandmother       Review of Systems  Constitutional: Positive for fatigue. Negative for activity change.  HENT: Negative for postnasal drip, rhinorrhea, sinus pressure, sinus pain, sore throat and voice change.   Eyes: Positive for itching.  Respiratory: Positive for shortness of breath. Negative for wheezing.   Cardiovascular: Negative for chest pain and palpitations.  Gastrointestinal: Negative for diarrhea, nausea, rectal pain and vomiting.       Has had colonoscopy and endoscopy done since her last visit. She reports that findings were good .  Endocrine: Negative for cold intolerance, heat intolerance, polydipsia, polyphagia and polyuria.  Thyroid problems   Genitourinary: Negative for flank pain, frequency, hematuria and urgency.  Musculoskeletal: Positive for arthralgias, back pain, myalgias and neck pain.  Skin: Negative for rash.  Allergic/Immunologic: Positive for environmental allergies.  Neurological: Positive for headaches.  Hematological: Negative for adenopathy.  Psychiatric/Behavioral: Positive for agitation. The patient is nervous/anxious.    Today's Vitals   07/07/18 1410 07/07/18 1411  BP: (!) 163/84   Pulse: 77   Resp: 16   SpO2: 95% 95%  Weight: 182 lb 3.2 oz (82.6 kg)   Height: 4\' 10"  (1.473 m)     Physical Exam  Constitutional: She is oriented to person, place, and time. She appears well-developed and well-nourished.  HENT:  Head: Normocephalic and atraumatic.  Nose: Nose normal.  Mouth/Throat: Oropharynx is clear and moist. No oropharyngeal exudate.  Eyes: Pupils are equal, round, and reactive to light.  Reddened conjunctiva with watering present.   Neck: Normal range of motion. Neck supple. No JVD present. No tracheal deviation present. No thyromegaly present.  Cardiovascular: Normal rate. An irregular rhythm present.  Murmur heard. Pulmonary/Chest: Effort normal and breath sounds normal. She has  no rhonchi. She has no rales.  The patient is on nsal cannula oxygen at all times.   Abdominal: Soft. Bowel sounds are normal. There is no tenderness.  Musculoskeletal:  There is moderate left shoulder pain with light and medium palpation. There is significant reduced ROM especially in regard to horizontal elevation of the left arm. There are no abnormalities or deformities noted today.  Lymphadenopathy:    She has no cervical adenopathy.  Neurological: She is alert and oriented to person, place, and time.  Skin: Skin is warm and dry. Capillary refill takes less than 2 seconds.  Psychiatric: Her speech is normal and behavior is normal. Judgment and thought content normal. Her mood appears anxious. Cognition and memory are normal. She exhibits a depressed mood.  Nursing note and vitals reviewed.  Depression screen [facility] 2/9 07/07/2018 04/12/2018 09/14/2017  Decreased Interest 0 0 1  Down, Depressed, Hopeless 0 0 3  PHQ - 2 Score 0 0 4  Altered sleeping - - 3  Tired, decreased energy - - 1  Change in appetite - - 1  Feeling bad or failure about yourself  - - 1  Trouble concentrating - - 3  Moving slowly or fidgety/restless - - 0  Suicidal thoughts - - 0  PHQ-9 Score - - 13  Difficult doing work/chores - - Not difficult at all    Functional Status Survey: Is the patient deaf or have difficulty hearing?: Yes(pt has hearing aides) Does the patient have difficulty seeing, even when wearing glasses/contacts?: Yes(right eye blurry.) Does the patient have difficulty concentrating, remembering, or making decisions?: Yes Does the patient have difficulty walking or climbing stairs?: Yes Does the patient have difficulty dressing or bathing?: No Does the patient have difficulty doing errands alone such as visiting a doctor's office or shopping?: No  MMSE - [place] Exam 07/07/2018  Orientation to time 5  Orientation to Place 5  Registration 3  Attention/ Calculation 5  Recall 3   Language- name 2 objects 2  Language- repeat 1  Language- follow 3 step command 3  Language- read & follow direction 1  Write a sentence 1  Copy design 1  Total score 30    Fall Risk  07/07/2018 04/12/2018 09/14/2017  Falls in the past year? Yes No No  Number falls in past yr: 1 - -  Injury with Fall? No - -    Assessment/Plan: 1. Encounter for general adult medical examination with abnormal findings Annual health maintenance exam today  2. Chronic left shoulder pain Will get x-ray of left shoulder for further evaluation.  - DG Shoulder Left; Future   3. Other chronic allergic conjunctivitis of right eye Will get allergy testing per patient request and refer to Dr. Devona Konig for testing and management of chronic allergies.  - Allergy Test  4. 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 exacerbation ([place]) Continue to use inhalers and respiratory medications as prescribed .  5. Oxygen dependent Continue oxygen per nasal cannula as prescribed.   6. Paroxysmal atrial fibrillation ([name]) Regular visits with cardiology as scheduled   7. GAD (generalized anxiety disorder) Continue duloxetine as prescribed. May use alprazolam 0.25mg  twice daily as needed. New prescription provided today. - ALPRAZolam (XANAX) 0.25 MG tablet; Take 2 tablets (0.5 mg total) by mouth at bedtime as needed.  Dispense: 60 tablet; Refill: 2  8. Ovarian failure - DG Bone Density; Future  General Counseling: Aryanah verbalizes understanding of the findings of todays visit and agrees with plan of treatment. I have discussed any further diagnostic evaluation that may be needed or ordered today. We also reviewed her medications today. she has been encouraged to call the office with any questions or concerns that should arise related to todays visit.    Counseling:   Hypertension Counseling:   The following hypertensive lifestyle modification were recommended and discussed:  1. Limiting alcohol intake to less  than 1 oz/day of ethanol:(24 oz of beer or 8 oz of wine or 2 oz of 100-proof whiskey). 2. Take baby ASA 81 mg daily. 3. Importance of regular aerobic exercise and losing weight. 4. Reduce dietary saturated fat and cholesterol intake for overall cardiovascular health. 5. Maintaining adequate dietary potassium, calcium, and magnesium intake. 6. Regular monitoring of the blood pressure. 7. Reduce sodium intake to less than 100 mmol/day (less than 2.3 gm of sodium or less than 6 gm of sodium choride)   Apply a compressive ACE bandage. Rest and elevate the affected painful area.  Apply cold compresses intermittently as needed.  As pain recedes, begin normal activities slowly as tolerated.  Call if symptoms persist.  This patient was seen by Leretha Pol FNP Collaboration with Dr [name] as a part of collaborative care agreement  Orders Placed This Encounter  Procedures  . Allergy Test  . DG Shoulder Left  . DG Bone Density  . Ambulatory referral to Pulmonology    Meds ordered this encounter  Medications  . ALPRAZolam (XANAX) 0.25 MG tablet    Sig: Take 2 tablets (0.5 mg total) by mouth at bedtime as needed.    Dispense:  60 tablet    Refill:  2    Order Specific Question:   Supervising Provider    Answer:   [name] [[id number]]    Time spent: [place], MD  Internal Medicine

## 2018-07-07 NOTE — Telephone Encounter (Signed)
Rx for Diltiazem 120 sent to [facility].

## 2018-07-09 DIAGNOSIS — Z79899 Other long term (current) drug therapy: Secondary | ICD-10-CM | POA: Insufficient documentation

## 2018-07-09 DIAGNOSIS — G8929 Other chronic pain: Secondary | ICD-10-CM | POA: Insufficient documentation

## 2018-07-09 DIAGNOSIS — E2839 Other primary ovarian failure: Secondary | ICD-10-CM | POA: Insufficient documentation

## 2018-07-09 DIAGNOSIS — Z9981 Dependence on supplemental oxygen: Secondary | ICD-10-CM | POA: Insufficient documentation

## 2018-07-09 DIAGNOSIS — H109 Unspecified conjunctivitis: Secondary | ICD-10-CM | POA: Insufficient documentation

## 2018-07-09 DIAGNOSIS — F411 Generalized anxiety disorder: Secondary | ICD-10-CM | POA: Insufficient documentation

## 2018-07-09 DIAGNOSIS — M25512 Pain in left shoulder: Secondary | ICD-10-CM

## 2018-07-09 DIAGNOSIS — Z0001 Encounter for general adult medical examination with abnormal findings: Principal | ICD-10-CM

## 2018-07-11 ENCOUNTER — Ambulatory Visit
Admission: RE | Admit: 2018-07-11 | Discharge: 2018-07-11 | Disposition: A | Discharge status: Home / Self Care | Payer: Medicare Other | Source: Ambulatory Visit | Attending: Nurse Practitioner | Admitting: Nurse Practitioner

## 2018-07-11 DIAGNOSIS — M19012 Primary osteoarthritis, left shoulder: Secondary | ICD-10-CM | POA: Insufficient documentation

## 2018-07-11 DIAGNOSIS — G8929 Other chronic pain: Secondary | ICD-10-CM

## 2018-07-11 DIAGNOSIS — M25512 Pain in left shoulder: Secondary | ICD-10-CM | POA: Diagnosis present

## 2018-07-13 ENCOUNTER — Other Ambulatory Visit: Payer: Self-pay

## 2018-07-13 MED ORDER — DILTIAZEM HCL ER COATED BEADS 120 MG PO CP24: 120.0000 mg | ORAL_CAPSULE | Freq: Every day | ORAL | 6 refills | Status: DC

## 2018-07-14 ENCOUNTER — Telehealth: Payer: Self-pay

## 2018-07-15 NOTE — Telephone Encounter (Signed)
Please let her know that she has moderate degenerative arthritis throughout the shoulder. If pain is persistent, or still getting worse, we can refer her to orthopedics. Thanks.

## 2018-07-15 NOTE — Telephone Encounter (Signed)
Pt advised for xray result if her pain is worse she will call us back for referral for orthopedic

## 2018-07-19 DIAGNOSIS — Z780 Asymptomatic menopausal state: Secondary | ICD-10-CM | POA: Diagnosis not present

## 2018-07-19 DIAGNOSIS — E2839 Other primary ovarian failure: Secondary | ICD-10-CM | POA: Diagnosis not present

## 2018-07-19 DIAGNOSIS — M85852 Other specified disorders of bone density and structure, left thigh: Secondary | ICD-10-CM | POA: Diagnosis not present

## 2018-07-20 DIAGNOSIS — I10 Essential (primary) hypertension: Secondary | ICD-10-CM | POA: Diagnosis not present

## 2018-07-20 DIAGNOSIS — E876 Hypokalemia: Secondary | ICD-10-CM | POA: Diagnosis not present

## 2018-07-20 DIAGNOSIS — R54 Age-related physical debility: Secondary | ICD-10-CM | POA: Diagnosis not present

## 2018-07-20 DIAGNOSIS — K8012 Calculus of gallbladder with acute and chronic cholecystitis without obstruction: Secondary | ICD-10-CM | POA: Diagnosis not present

## 2018-07-20 DIAGNOSIS — J439 Emphysema, unspecified: Secondary | ICD-10-CM | POA: Diagnosis not present

## 2018-07-20 DIAGNOSIS — Z79899 Other long term (current) drug therapy: Secondary | ICD-10-CM | POA: Diagnosis not present

## 2018-07-20 DIAGNOSIS — Z9181 History of falling: Secondary | ICD-10-CM | POA: Diagnosis not present

## 2018-07-20 DIAGNOSIS — Z01818 Encounter for other preprocedural examination: Secondary | ICD-10-CM | POA: Diagnosis not present

## 2018-07-21 ENCOUNTER — Telehealth: Payer: Self-pay

## 2018-07-21 NOTE — Telephone Encounter (Signed)
Pt called for samples of Anora due to her about to run out, informed the patient that we are currently out and I would give her a call when we had some to come in.

## 2018-07-25 ENCOUNTER — Other Ambulatory Visit: Payer: Self-pay | Admitting: Internal Medicine

## 2018-07-25 NOTE — Telephone Encounter (Signed)
Pt called wanting to know about her rx combivent. I called the [facility] pharmacy and spoke with diea'ha and she stated that the combivent needed prior authorization.

## 2018-08-01 ENCOUNTER — Telehealth: Payer: Self-pay

## 2018-08-01 ENCOUNTER — Other Ambulatory Visit: Payer: Self-pay | Admitting: Nurse Practitioner

## 2018-08-01 DIAGNOSIS — Z298 Encounter for other specified prophylactic measures: Secondary | ICD-10-CM

## 2018-08-01 MED ORDER — AMOXICILLIN 500 MG PO CAPS: ORAL_CAPSULE | ORAL | 0 refills | Status: DC

## 2018-08-01 NOTE — Telephone Encounter (Signed)
Sent amoxicillin 500mg  - take four tablets one time about 1 hour prior to dental procedure to [facility] pharmacy. Also, ok for her to have ortho consult due to chronic left shoulder pain. Recently had x-ray. Offered her orthopedics at that visit, but she declined at that time. Ok to do now. thanks

## 2018-08-01 NOTE — Telephone Encounter (Signed)
Pt advised we send antibiotic and send beth message for refrral for ortho

## 2018-08-01 NOTE — Progress Notes (Signed)
Sent amoxicillin 500mg  - take four tablets one time about 1 hour prior to dental procedure to [facility] pharmacy.

## 2018-08-03 ENCOUNTER — Telehealth: Payer: Self-pay

## 2018-08-03 NOTE — Telephone Encounter (Signed)
Received call from patients oxygen supplier and they said they just placed her blank Certificate of Medical Necessity in the mail for [place]

## 2018-08-08 ENCOUNTER — Encounter: Payer: Self-pay | Admitting: Internal Medicine

## 2018-08-08 ENCOUNTER — Ambulatory Visit: Payer: Medicare Other | Admitting: Internal Medicine

## 2018-08-08 VITALS — BP 124/62 | HR 85 | Resp 16 | Ht <= 58 in | Wt 186.0 lb

## 2018-08-08 DIAGNOSIS — R0602 Shortness of breath: Secondary | ICD-10-CM

## 2018-08-08 DIAGNOSIS — Z9981 Dependence on supplemental oxygen: Secondary | ICD-10-CM | POA: Diagnosis not present

## 2018-08-08 DIAGNOSIS — G4733 Obstructive sleep apnea (adult) (pediatric): Secondary | ICD-10-CM | POA: Diagnosis not present

## 2018-08-08 DIAGNOSIS — J449 Chronic obstructive pulmonary disease, unspecified: Secondary | ICD-10-CM | POA: Diagnosis not present

## 2018-08-08 DIAGNOSIS — J9611 Chronic respiratory failure with hypoxia: Secondary | ICD-10-CM | POA: Diagnosis not present

## 2018-08-08 DIAGNOSIS — Z9989 Dependence on other enabling machines and devices: Secondary | ICD-10-CM

## 2018-08-08 MED ORDER — IPRATROPIUM-ALBUTEROL 20-100 MCG/ACT IN AERS: 1.0000 | INHALATION_SPRAY | Freq: Four times a day (QID) | RESPIRATORY_TRACT | 5 refills | Status: DC

## 2018-08-08 NOTE — Patient Instructions (Signed)

## 2018-08-08 NOTE — Progress Notes (Signed)
[facility] [place], [place] [address]  [facility]   Office Visit Note  Patient Name: [name] DOB: [birth year] MRN [id number]  Date of Service: 08/08/2018  Complaints/HPI: Pt here for allergy testing however she is unwilling to pay the co-pay at this time.  She has history of COPD as well as chronic bronchitis.  She reports that she was recently switched to Anoro inhaler and she cannot afford to buy this inhaler.  She is requesting to go back on her Combivent inhaler, and she will need a new prescription.  She continues to require oxygen 24 hours a day.  At home she uses 2.5 L/min via nasal cannula when she is out and about she said her concentrator to 3 L/min.  She reports decent relief of shortness of breath using these numbers.  ROS  General: (-) fever, (-) chills, (-) night sweats, (-) weakness Skin: (-) rashes, (-) itching,. Eyes: (-) visual changes, (-) redness, (-) itching. Nose and Sinuses: (-) nasal stuffiness or itchiness, (-) postnasal drip, (-) nosebleeds, (-) sinus trouble. Mouth and Throat: (-) sore throat, (-) hoarseness. Neck: (-) swollen glands, (-) enlarged thyroid, (-) neck pain. Respiratory: - cough, (-) bloody sputum, + shortness of breath, - wheezing. Cardiovascular: - ankle swelling, (-) chest pain. Lymphatic: (-) lymph node enlargement. Neurologic: (-) numbness, (-) tingling. Psychiatric: (-) anxiety, (-) depression   Current Medication: Outpatient Encounter Medications as of 08/08/2018  Medication Sig  . acetaminophen (TYLENOL) 500 MG tablet Take 500 mg by mouth 2 (two) times daily as needed for moderate pain or headache.  . albuterol (PROVENTIL HFA;VENTOLIN HFA) 108 (90 Base) MCG/ACT inhaler Inhale 2 puffs into the lungs every 6 (six) hours as needed for wheezing or shortness of breath.  [name] albuterol-ipratropium (COMBIVENT) 18-103 MCG/ACT inhaler Inhale 1 puff into the lungs 3 (three) times daily as needed for  wheezing or shortness of breath.  . ALPRAZolam (XANAX) 0.25 MG tablet Take 2 tablets (0.5 mg total) by mouth at bedtime as needed.  [name] amoxicillin (AMOXIL) 500 MG capsule Take 4 tablets po one time, one hour prior to dental procedure.  Jearl Klinefelter ELLIPTA 62.5-25 MCG/INH AEPB 1 Inhaler.  Darlin Priestly HFA 200 MCG/ACT AERO   . atorvastatin (LIPITOR) 40 MG tablet 1 tab po qhs  . Coenzyme Q10 (COQ10) 200 MG CAPS Take 200 mg by mouth daily.  . diclofenac sodium (VOLTAREN) 1 % GEL To the affected joint  . diltiazem (CARDIZEM CD) 120 MG 24 hr capsule Take 1 capsule (120 mg total) by mouth daily.  [name] diltiazem (TIAZAC) 180 MG 24 hr capsule Take by mouth daily.  . DULoxetine (CYMBALTA) 60 MG capsule Take 1 capsule (60 mg total) by mouth 2 (two) times daily. (Patient taking differently: Take 60 mg by mouth daily. )  . ELIQUIS 5 MG TABS tablet Take 1 tablet (5 mg total) by mouth 2 (two) times daily.  . ferrous sulfate (FERROUSUL) 325 (65 FE) MG tablet Take 325 mg by mouth daily with breakfast.  . flecainide (TAMBOCOR) 50 MG tablet Take 1 tablet (50 mg total) by mouth 2 (two) times daily.  . hydrALAZINE (APRESOLINE) 25 MG tablet Take 1 tablet (25 mg total) by mouth 2 (two) times daily.  [name] Ketotifen Fumarate (ALAWAY OP) Apply 1 drop to eye daily as needed (allergies).  [name] levothyroxine (SYNTHROID, LEVOTHROID) 25 MCG tablet Take 1 tablet (25 mcg total) by mouth daily before breakfast.  . lisinopril-hydrochlorothiazide (PRINZIDE,ZESTORETIC) 20-25 MG tablet Take  1 tablet by mouth daily.  [name] loratadine (CLARITIN) 10 MG tablet Take 10 mg by mouth daily.  . mometasone (ASMANEX) 220 MCG/INH inhaler Inhale 1 puff into the lungs 2 (two) times daily.  [name] neomycin-polymyxin b-dexamethasone (MAXITROL) 3.5-10000-0.1 SUSP INSTILL 1 DROP(S) IN LEFT EYE 3 TIMES A DAY  . Omega-3 1000 MG CAPS Take by mouth.  . OXYGEN Inhale into the lungs.  . pantoprazole (PROTONIX) 40 MG tablet Take 1 tablet (40 mg total) by mouth daily.  . potassium  chloride (MICRO-K) 10 MEQ CR capsule take 1 capsule po TID (Patient taking differently: 20 mEq. Take 2 in am and 2 tab at pm and pts gets by other  MD)  . propranolol (INDERAL) 10 MG tablet Take by mouth.  . sodium chloride (OCEAN) 0.65 % SOLN nasal spray Place 1 spray into both nostrils daily as needed for congestion.  . traZODone (DESYREL) 50 MG tablet Take 2 tablets (100 mg total) by mouth at bedtime. (Patient taking differently: Take 50 mg by mouth at bedtime. )   No facility-administered encounter medications on file as of 08/08/2018.     Surgical History: Past Surgical History:  Procedure Laterality Date  . ABDOMINAL AORTIC ANEURYSM REPAIR  2008  . ABDOMINAL HYSTERECTOMY    . AORTIC VALVE REPLACEMENT  2008  . CARDIAC CATHETERIZATION     [facility]  . CARPAL TUNNEL RELEASE    . ELECTROPHYSIOLOGIC STUDY N/A 08/17/2016   Procedure: Cardioversion;  Surgeon: [name], MD;  Location: [facility] ORS;  Service: Cardiovascular;  Laterality: N/A;  . TUMOR EXCISION Left    x3 (arm)    Medical History: Past Medical History:  Diagnosis Date  . 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 s/p bioprosthetic valve replacement 2008 at [facility];  b. 01/2015 Echo: EF 60-65%, no rwma, Gr1 DD, mildly dil LA, nl RV fxn.  . Atrial fibrillation with RVR ([place]) 01/11/2015  . Atrial flutter ([place])    a. 08/2016 s/p DCCV.  Remains on flecainide 50 mg bid.  . Basal skull fracture ([place]) 20 yrs ago  . Chronic respiratory failure ([place])   . COPD (chronic obstructive pulmonary disease) ([place])    a. on home O2 at 2L since 2008  . 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 Essential hypertension 01/24/2015  . History of cardiac cath    a. 2008 prior to Aortic aneurysm repair-->nl cors.  . History of stress test    a. 10/2015 MV: no ischemia/infarct.  [name] HLD (hyperlipidemia)   . HTN (hypertension)   . Hypothyroidism   . Obesity   . PAF (paroxysmal atrial fibrillation) ([name])    a. on Eliquis;  b. CHADS2VASc = 3 (HTN, age x 1, female).  . Paroxysmal atrial fibrillation ([place]) 01/24/2015  . Right upper quadrant abdominal tenderness without rebound tenderness 03/02/2018  . S/P ascending aortic aneurysm repair 2008    Family History: Family History  Problem Relation Age of Onset  . Stroke Father   . Stroke Paternal Grandmother     Social History: Social History   Socioeconomic History  . Marital status: Divorced    Spouse name: Not on file  . Number of children: Not on file  . Years of education: Not on file  . Highest education level: Not on file  Occupational History  . Not on file  Social Needs  . Financial resource strain: Not on file  . Food insecurity:  Worry: Not on file    Inability: Not on file  . Transportation needs:    Medical: Not on file    Non-medical: Not on file  Tobacco Use  . Smoking status: Former Research scientist (life sciences)  . Smokeless tobacco: Never Used  Substance and Sexual Activity  . Alcohol use: No  . Drug use: No  . Sexual activity: Never    Birth control/protection: Surgical  Lifestyle  . Physical activity:    Days per week: Not on file    Minutes per session: Not on file  . Stress: Not on file  Relationships  . Social connections:    Talks on phone: Not on file    Gets together: Not on file    Attends religious service: Not on file    Active member of club or organization: Not on file    Attends meetings of clubs or organizations: Not on file    Relationship status: Not on file  . Intimate partner violence:    Fear of current or ex partner: Not on file    Emotionally abused: Not on file    Physically abused: Not on file    Forced sexual activity: Not on file  Other Topics Concern  . Not on file  Social History Narrative  . Not on file    Vital Signs: Blood pressure 124/62, pulse 85, resp. rate 16, height 4\' 10"  (1.473 m), weight 186 lb (84.4 kg), SpO2 94 %.  Examination: General Appearance: The patient is well-developed,  well-nourished, and in no distress. Skin: Gross inspection of skin unremarkable. Head: normocephalic, no gross deformities. Eyes: no gross deformities noted. ENT: ears appear grossly normal no exudates. Neck: Supple. No thyromegaly. No LAD. Respiratory: Diminished bilateraly. Cardiovascular: Normal S1 and S2 without murmur or rub. Extremities: No cyanosis. pulses are equal. Neurologic: Alert and oriented. No involuntary movements.  LABS: No results found for this or any previous visit (from the past 2160 hour(s)).  Radiology: Dg Shoulder Left  Result Date: 07/11/2018 CLINICAL DATA:  Left shoulder pain.  No injury. EXAM: LEFT SHOULDER - 2+ VIEW COMPARISON:  04/23/2014 FINDINGS: Exam demonstrates moderate degenerative changes of the glenohumeral joint with progression since the previous exam. Minimal degenerate change of the [facility] joint. No acute fracture or dislocation. IMPRESSION: No acute findings. Moderate degenerative changes of the glenohumeral joint and minimal degenerate change of the [facility] joint. Electronically Signed   By: Marin Olp M.D.   On: 07/11/2018 14:30    No results found.  Dg Shoulder Left  Result Date: 07/11/2018 CLINICAL DATA:  Left shoulder pain.  No injury. EXAM: LEFT SHOULDER - 2+ VIEW COMPARISON:  04/23/2014 FINDINGS: Exam demonstrates moderate degenerative changes of the glenohumeral joint with progression since the previous exam. Minimal degenerate change of the [facility] joint. No acute fracture or dislocation. IMPRESSION: No acute findings. Moderate degenerative changes of the glenohumeral joint and minimal degenerate change of the [facility] joint. Electronically Signed   By: Marin Olp M.D.   On: 07/11/2018 14:30      Assessment and Plan: Patient Active Problem List   Diagnosis Date Noted  . Encounter for general adult medical examination with abnormal findings 07/09/2018  . Chronic left shoulder pain 07/09/2018  . Conjunctivitis 07/09/2018  . Oxygen dependent  07/09/2018  . GAD (generalized anxiety disorder) 07/09/2018  . Ovarian failure 07/09/2018  . Positive colorectal cancer screening using Cologuard test 04/24/2018  . Calculus of gallbladder with acute on chronic cholecystitis 04/08/2018  . H/O: CVA (cerebrovascular accident)  04/08/2018  . Dysuria 03/02/2018  . Urinary tract infection without hematuria 03/02/2018  . Right upper quadrant abdominal tenderness without rebound tenderness 03/02/2018  . 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 exacerbation ([place]) 03/02/2018  . COPD (chronic obstructive pulmonary disease) ([place]) 09/19/2017  . Typical atrial flutter ([place])   . Irritable bowel syndrome without diarrhea 01/29/2015  . Essential hypertension 01/24/2015  . Paroxysmal atrial fibrillation ([place]) 01/24/2015  . History of aortic valve replacement with bioprosthetic valve 01/24/2015  . Atrial fibrillation with RVR ([place]) 01/11/2015  . 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 Impingement syndrome of shoulder 05/18/2014  . Cellulitis and abscess 02/13/2014  . MRSA (methicillin resistant Staphylococcus aureus) 02/13/2014  . Aneurysm, ascending aorta ([place]) 06/23/2013  . Bicuspid aortic valve 06/23/2013   1. Chronic obstructive pulmonary disease, unspecified COPD type ([name]) Severe disease, continue with present symptom management.  2. OSA on CPAP Stable, patient should continue to use CPAP as prescribed.  3. Oxygen dependent Continue using oxygen as prescribed.  4. Chronic respiratory failure with hypoxia ([name]) Patient has ongoing oxygen requirements of 2 L/min via nasal cannula.  5. SOB (shortness of breath) - Spirometry with Graph   General Counseling: I have discussed the findings of the evaluation and examination with [facility].  I have also discussed any further diagnostic evaluation thatmay be needed or ordered today. Celina verbalizes understanding of the findings of todays visit. We also reviewed her  medications today and discussed drug interactions and side effects including but not limited excessive drowsiness and altered mental states. We also discussed that there is always a risk not just to her but also people around her. she has been encouraged to call the office with any questions or concerns that should arise related to todays visit.    Time spent: 25 This patient was seen by Orson Gear AGNP-C in Collaboration with Dr. Devona Konig as a part of collaborative care agreement.   I have personally obtained a history, examined the patient, evaluated laboratory and imaging results, formulated the assessment and plan and placed orders.    Allyne Gee, MD [facility]

## 2018-08-10 ENCOUNTER — Encounter: Payer: Self-pay | Admitting: Emergency Medicine

## 2018-08-10 ENCOUNTER — Emergency Department
Admission: EM | Admit: 2018-08-10 | Discharge: 2018-08-10 | Disposition: A | Discharge status: Home / Self Care | Payer: Medicare Other | Attending: Emergency Medicine | Admitting: Emergency Medicine

## 2018-08-10 ENCOUNTER — Other Ambulatory Visit: Payer: Self-pay

## 2018-08-10 ENCOUNTER — Emergency Department: Payer: Medicare Other

## 2018-08-10 ENCOUNTER — Telehealth: Payer: Self-pay | Admitting: Cardiovascular Disease

## 2018-08-10 DIAGNOSIS — E039 Hypothyroidism, unspecified: Secondary | ICD-10-CM | POA: Diagnosis not present

## 2018-08-10 DIAGNOSIS — Z79899 Other long term (current) drug therapy: Secondary | ICD-10-CM | POA: Diagnosis not present

## 2018-08-10 DIAGNOSIS — Z87891 Personal history of nicotine dependence: Secondary | ICD-10-CM | POA: Insufficient documentation

## 2018-08-10 DIAGNOSIS — Z7901 Long term (current) use of anticoagulants: Secondary | ICD-10-CM | POA: Insufficient documentation

## 2018-08-10 DIAGNOSIS — Z952 Presence of prosthetic heart valve: Secondary | ICD-10-CM | POA: Insufficient documentation

## 2018-08-10 DIAGNOSIS — I4891 Unspecified atrial fibrillation: Secondary | ICD-10-CM | POA: Insufficient documentation

## 2018-08-10 DIAGNOSIS — I10 Essential (primary) hypertension: Secondary | ICD-10-CM | POA: Diagnosis not present

## 2018-08-10 DIAGNOSIS — J449 Chronic obstructive pulmonary disease, unspecified: Secondary | ICD-10-CM | POA: Insufficient documentation

## 2018-08-10 DIAGNOSIS — R0602 Shortness of breath: Secondary | ICD-10-CM | POA: Insufficient documentation

## 2018-08-10 DIAGNOSIS — R002 Palpitations: Secondary | ICD-10-CM | POA: Insufficient documentation

## 2018-08-10 DIAGNOSIS — Z9981 Dependence on supplemental oxygen: Secondary | ICD-10-CM | POA: Diagnosis not present

## 2018-08-10 LAB — CBC
HCT: 35.6 % — ABNORMAL LOW (ref 36.0–46.0)
Hemoglobin: 11.6 g/dL — ABNORMAL LOW (ref 12.0–15.0)
MCH: 27.5 pg (ref 26.0–34.0)
MCHC: 32.6 g/dL (ref 30.0–36.0)
MCV: 84.4 fL (ref 80.0–100.0)
Platelets: 293 10*3/uL (ref 150–400)
RBC: 4.22 MIL/uL (ref 3.87–5.11)
RDW: 16.8 % — ABNORMAL HIGH (ref 11.5–15.5)
WBC: 7.6 10*3/uL (ref 4.0–10.5)
nRBC: 0 % (ref 0.0–0.2)

## 2018-08-10 LAB — BASIC METABOLIC PANEL
Anion gap: 10 (ref 5–15)
BUN: 11 mg/dL (ref 8–23)
CO2: 26 mmol/L (ref 22–32)
Calcium: 9.3 mg/dL (ref 8.9–10.3)
Chloride: 99 mmol/L (ref 98–111)
Creatinine, Ser: 0.59 mg/dL (ref 0.44–1.00)
GFR calc Af Amer: >60 mL/min (ref >60)
GFR calc non Af Amer: >60 mL/min (ref >60)
Glucose, Bld: 108 mg/dL — ABNORMAL HIGH (ref 70–99)
Potassium: 3.7 mmol/L (ref 3.5–5.1)
Sodium: 135 mmol/L (ref 135–145)

## 2018-08-10 LAB — TSH: TSH: 2.191 u[IU]/mL (ref 0.350–4.500)

## 2018-08-10 LAB — TROPONIN I: Troponin I: <0.03 ng/mL (ref <0.03)

## 2018-08-10 NOTE — Telephone Encounter (Signed)
STAT if patient feels like he/she is going to faint   1) Are you dizzy now? A little bit  2) Do you feel faint or have you passed out? Yes, patient feels faint  3) Do you have any other symptoms? Oxygen is fluctuation.   4) Have you checked your HR and BP (record if available)? HR is 115, has not taken BP

## 2018-08-10 NOTE — Telephone Encounter (Signed)
Patient sister calling  States that patient has been waiting in the ER all day and is unaware of what is going on Says there is no communication Would like to know if the ER doctor has contacted Dr. Fletcher Anon about situation Please call to discuss, best number to call is [phone]

## 2018-08-10 NOTE — ED Triage Notes (Signed)
Pt in via POV, states, "Everything has gone haywire, my breathing, my pulse; all of it has been going up and down."  Pt on 2.5L nasal cannula at baseline.  NAD noted at this time.

## 2018-08-10 NOTE — ED Provider Notes (Signed)
[facility] Emergency Department Provider Note  ____________________________________________   I have reviewed the triage vital signs and the nursing notes. Where available I have reviewed prior notes and, if possible and indicated, outside hospital notes.    HISTORY  Chief Complaint Shortness of Breath and Palpitations    HPI [name] is a 72 y.o. female with history of aortic stenosis status post bioprosthetic valve replacement 2008, history of recurrent atrial fibrillation with RVR with a history of cardioversion about 2 years ago, on flecainide and diltiazem, states this morning she woke up and had palpitation.  To me, she denies any chest pain or shortness of breath.  She states "I am always a little short of breath because of my COPD".  Patient is on 2 L of oxygen all the time.  She is been having trouble getting 1 of her albuterol meds filled but she believes that to be taken care of at this time.  She states that her biggest concern is that her heart was palpating.  She denies any chest pain, it began this morning at 9:00, has been constantly going since then.  This feels exactly like her prior atrial fibrillation.  She states she called her cardiologist but they did not call her back and time and she came in to be further evaluated.  She denies any fever or chills nausea or vomiting leg swelling or exertional symptoms.  She just feels a heartbeat that is irregular.  She states she did become very anxious about this, checked her blood pressure multiple times at home, her maximum pressure was 150/95 at which time she decided it was important to come in here.  Patient is noted to have had a prior heart cath, with negative coronary artery disease, and she is always on 2 L at home and has not had to increase that.  No leg swelling.    Past Medical History:  Diagnosis Date  . 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 s/p bioprosthetic valve replacement  2008 at [facility];  b. 01/2015 Echo: EF 60-65%, no rwma, Gr1 DD, mildly dil LA, nl RV fxn.  . Atrial fibrillation with RVR ([place]) 01/11/2015  . Atrial flutter ([place])    a. 08/2016 s/p DCCV.  Remains on flecainide 50 mg bid.  . Basal skull fracture ([place]) 20 yrs ago  . Chronic respiratory failure ([place])   . COPD (chronic obstructive pulmonary disease) ([place])    a. on home O2 at 2L since 2008  . 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 Essential hypertension 01/24/2015  . History of cardiac cath    a. 2008 prior to Aortic aneurysm repair-->nl cors.  . History of stress test    a. 10/2015 MV: no ischemia/infarct.  [name] HLD (hyperlipidemia)   . HTN (hypertension)   . Hypothyroidism   . Obesity   . PAF (paroxysmal atrial fibrillation) ([name])    a. on Eliquis; b. CHADS2VASc = 3 (HTN, age x 1, female).  . Paroxysmal atrial fibrillation ([place]) 01/24/2015  . Right upper quadrant abdominal tenderness without rebound tenderness 03/02/2018  . S/P ascending aortic aneurysm repair 2008    Patient Active Problem List   Diagnosis Date Noted  . Encounter for general adult medical examination with abnormal findings 07/09/2018  . Chronic left shoulder pain 07/09/2018  . Conjunctivitis 07/09/2018  . Oxygen dependent 07/09/2018  . GAD (generalized anxiety disorder) 07/09/2018  . Ovarian  failure 07/09/2018  . Positive colorectal cancer screening using Cologuard test 04/24/2018  . Calculus of gallbladder with acute on chronic cholecystitis 04/08/2018  . H/O: CVA (cerebrovascular accident) 04/08/2018  . Dysuria 03/02/2018  . Urinary tract infection without hematuria 03/02/2018  . Right upper quadrant abdominal tenderness without rebound tenderness 03/02/2018  . 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 exacerbation ([place]) 03/02/2018  . COPD (chronic obstructive pulmonary disease) ([place]) 09/19/2017  . Typical atrial flutter ([place])   . Irritable bowel syndrome without diarrhea 01/29/2015  . Essential  hypertension 01/24/2015  . Paroxysmal atrial fibrillation ([place]) 01/24/2015  . History of aortic valve replacement with bioprosthetic valve 01/24/2015  . Atrial fibrillation with RVR ([place]) 01/11/2015  . 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 Impingement syndrome of shoulder 05/18/2014  . Cellulitis and abscess 02/13/2014  . MRSA (methicillin resistant Staphylococcus aureus) 02/13/2014  . Aneurysm, ascending aorta ([place]) 06/23/2013  . Bicuspid aortic valve 06/23/2013    Past Surgical History:  Procedure Laterality Date  . ABDOMINAL AORTIC ANEURYSM REPAIR  2008  . ABDOMINAL HYSTERECTOMY    . AORTIC VALVE REPLACEMENT  2008  . CARDIAC CATHETERIZATION     [facility]  . CARPAL TUNNEL RELEASE    . ELECTROPHYSIOLOGIC STUDY N/A 08/17/2016   Procedure: Cardioversion;  Surgeon: [name], MD;  Location: [facility] ORS;  Service: Cardiovascular;  Laterality: N/A;  . TUMOR EXCISION Left    x3 (arm)    Prior to Admission medications   Medication Sig Start Date End Date Taking? Authorizing Provider  acetaminophen (TYLENOL) 500 MG tablet Take 500 mg by mouth 2 (two) times daily as needed for moderate pain or headache.   Yes [provider]  albuterol (PROVENTIL HFA;VENTOLIN HFA) 108 (90 Base) MCG/ACT inhaler Inhale 2 puffs into the lungs every 6 (six) hours as needed for wheezing or shortness of breath. 01/11/18  Yes Boscia, Greer Ee, NP  ALPRAZolam (XANAX) 0.25 MG tablet Take 2 tablets (0.5 mg total) by mouth at bedtime as needed. 07/07/18  Yes Ronnell Freshwater, NP  atorvastatin (LIPITOR) 40 MG tablet 1 tab po qhs 06/08/18  Yes Boscia, Heather E, NP  Coenzyme Q10 (COQ10) 200 MG CAPS Take 200 mg by mouth daily.   Yes [provider]  diclofenac sodium (VOLTAREN) 1 % GEL To the affected joint 09/17/17  Yes Boscia, Heather E, NP  diltiazem (CARDIZEM CD) 120 MG 24 hr capsule Take 1 capsule (120 mg total) by mouth daily. 07/13/18 10/11/18 Yes Theora Gianotti, NP  DULoxetine (CYMBALTA) 60 MG capsule Take 1 capsule (60 mg total) by mouth 2 (two) times daily. Patient taking differently: Take 60 mg by mouth daily.  06/08/18  Yes Boscia, Heather E, NP  ELIQUIS 5 MG TABS tablet Take 1 tablet (5 mg total) by mouth 2 (two) times daily. 06/08/18  Yes [name], MD  ferrous sulfate (FERROUSUL) 325 (65 FE) MG tablet Take 325 mg by mouth daily with breakfast.   Yes [provider]  flecainide (TAMBOCOR) 50 MG tablet Take 1 tablet (50 mg total) by mouth 2 (two) times daily. 11/11/17  Yes [name], MD  hydrALAZINE (APRESOLINE) 25 MG tablet Take 1 tablet (25 mg total) by mouth 2 (two) times daily. 05/10/18 08/10/18 Yes [name], MD  Ipratropium-Albuterol (COMBIVENT) 20-100 MCG/ACT AERS respimat Inhale 1 puff into the lungs every 6 (six) hours. 08/08/18  Yes [place], Audie Clear, NP  Ketotifen Fumarate (ALAWAY OP) Apply 1 drop to eye daily as  needed (allergies).   Yes [provider]  levothyroxine (SYNTHROID, LEVOTHROID) 25 MCG tablet Take 1 tablet (25 mcg total) by mouth daily before breakfast. 07/07/18  Yes Boscia, Heather E, NP  lisinopril-hydrochlorothiazide (PRINZIDE,ZESTORETIC) 20-25 MG tablet Take 1 tablet by mouth daily. 05/10/18  Yes [name], MD  loratadine (CLARITIN) 10 MG tablet Take 10 mg by mouth daily.   Yes [provider]  OXYGEN Inhale into the lungs.   Yes [provider]  pantoprazole (PROTONIX) 40 MG tablet Take 1 tablet (40 mg total) by mouth daily. 07/07/18  Yes Boscia, Heather E, NP  potassium chloride (MICRO-K) 10 MEQ CR capsule take 1 capsule po TID Patient taking differently: 20 mEq. Take 2 in am and 2 tab at pm and pts gets by other  MD 03/11/18  Yes Boscia, Greer Ee, NP  sodium chloride (OCEAN) 0.65 % SOLN nasal spray Place 1 spray into both nostrils daily as needed for congestion.   Yes [provider]  traZODone (DESYREL) 50 MG tablet Take 2 tablets (100 mg total) by  mouth at bedtime. Patient taking differently: Take 50 mg by mouth at bedtime.  06/08/18  Yes Boscia, Greer Ee, NP  amoxicillin (AMOXIL) 500 MG capsule Take 4 tablets po one time, one hour prior to dental procedure. 08/01/18   Ronnell Freshwater, NP    Allergies Benadryl [diphenhydramine hcl (sleep)]; Cetirizine; Diphenhydramine-zinc acetate; Lasix [furosemide]; Levaquin [levofloxacin in d5w]; Levofloxacin; Meloxicam; Soy allergy; and Sulfa antibiotics  Family History  Problem Relation Age of Onset  . Stroke Father   . Stroke Paternal Grandmother     Social History Social History   Tobacco Use  . Smoking status: Former Research scientist (life sciences)  . Smokeless tobacco: Never Used  Substance Use Topics  . Alcohol use: No  . Drug use: No    Review of Systems Constitutional: No fever/chills Eyes: No visual changes. ENT: No sore throat. No stiff neck no neck pain Cardiovascular: Denies chest pain. Respiratory: Denies shortness of breath. Gastrointestinal:   no vomiting.  No diarrhea.  No constipation. Genitourinary: Negative for dysuria. Musculoskeletal: Negative lower extremity swelling Skin: Negative for rash. Neurological: Negative for severe headaches, focal weakness or numbness.   ____________________________________________   PHYSICAL EXAM:  VITAL SIGNS: ED Triage Vitals  Enc Vitals Group     BP 08/10/18 1257 139/72     Pulse Rate 08/10/18 1257 (!) 113     Resp 08/10/18 1257 18     Temp 08/10/18 1257 98.3 F (36.8 C)     Temp Source 08/10/18 1257 Oral     SpO2 08/10/18 1257 96 %     Weight 08/10/18 1251 186 lb (84.4 kg)     Height 08/10/18 1251 4\' 10"  (1.473 m)     Head Circumference --      Peak Flow --      Pain Score 08/10/18 1251 0     Pain Loc --      Pain Edu? --      Excl. in [place]? --     Constitutional: Alert and oriented. Well appearing and in no acute distress. Eyes: Conjunctivae are normal Head: Atraumatic HEENT: No congestion/rhinnorhea. Mucous membranes are  moist.  Oropharynx non-erythematous Neck:   Nontender with no meningismus, no masses, no stridor Cardiovascular: Normal rate, irregularly irregular. Grossly normal heart sounds.  Good peripheral circulation. Respiratory: Normal respiratory effort.  No retractions. Lungs CTAB. Abdominal: Soft and nontender. No distention. No guarding no rebound Back:  There is no  focal tenderness or step off.  there is no midline tenderness there are no lesions noted. there is no CVA tenderness Musculoskeletal: No lower extremity tenderness, no upper extremity tenderness. No joint effusions, no DVT signs strong distal pulses no edema Neurologic:  Normal speech and language. No gross focal neurologic deficits are appreciated.  Skin:  Skin is warm, dry and intact. No rash noted. Psychiatric: Mood and affect are normal. Speech and behavior are normal.  ____________________________________________   LABS (all labs ordered are listed, but only abnormal results are displayed)  Labs Reviewed  BASIC METABOLIC PANEL - Abnormal; Notable for the following components:      Result Value   Glucose, Bld 108 (*)    All other components within normal limits  CBC - Abnormal; Notable for the following components:   Hemoglobin 11.6 (*)    HCT 35.6 (*)    RDW 16.8 (*)    All other components within normal limits  TROPONIN I  TSH    Pertinent labs  results that were available during my care of the patient were reviewed by me and considered in my medical decision making (see chart for details). ____________________________________________  EKG  I personally interpreted any EKGs ordered by me or triage  ____________________________________________  RADIOLOGY  Pertinent labs & imaging results that were available during my care of the patient were reviewed by me and considered in my medical decision making (see chart for details). If possible, patient and/or family made aware of any abnormal findings.  Dg Chest 2  View  Result Date: 08/10/2018 CLINICAL DATA:  Heart palpitations and dyspnea EXAM: CHEST - 2 VIEW COMPARISON:  01/22/2017 FINDINGS: Mild cardiomegaly with moderate aortic atherosclerosis. Median sternotomy sutures are in place with aortic valvular replacement noted. Coarsened interstitial lung markings and pulmonary vascular congestion is seen. Chronic scarring is noted at the lung bases. Osteoarthritis about both shoulders with probable subcoracoid loose bodies on the left. Degenerative changes are present along the dorsal spine. Surgical clips project over the right upper thorax as before. IMPRESSION: 1. Mild cardiomegaly with central vascular congestion. 2. Aortic atherosclerosis and aortic valvular replacement. Electronically Signed   By: Ashley Royalty M.D.   On: 08/10/2018 13:47   ____________________________________________    PROCEDURES  Procedure(s) performed: None  Procedures  Critical Care performed: None  ____________________________________________   INITIAL IMPRESSION / ASSESSMENT AND PLAN / ED COURSE  Pertinent labs & imaging results that were available during my care of the patient were reviewed by me and considered in my medical decision making (see chart for details).  Patient with atrial fibrillation paroxysmally, with an irregular heartbeat most likely recurrent atrial fib although flutter is possible.  I did discuss with Dr. Rockey Situ, her cardiologist.  She has reassuring vital signs, no evidence of decompensation and she is rate controlled.  There is no evidence of PE, and she is already anticoagulated on Eliquis.  Accordingly, our options according to the cardiologist are two.  The first is a cardioversion, and the second is increased medication on flecainide and diltiazem and outpatient follow-up.  Patient does not meet criteria for acute admission for this atrial fibrillation given all of the parameters of the particular case according cardiology.  I agree.  I did present  the patient with these options from her cardiologist.  She refuses cardioversion she understands risk benefits and uncertainties of the procedure and of the procedure.  She does agree to try taking increased medications.  Return precautions follow-up given understood.  I  see no utility in serial enzymes, her troponin was negative despite symptoms since very early this morning.  She is already anticoagulated there is no other acute intervention required.  If she does have increasing shortness of breath or other concerns he understands he must return to the emergency department.  Patient has been a very active and informed part of the decision-making process.  Patient did eat a pizza here with no difficulty.    ____________________________________________   FINAL CLINICAL IMPRESSION(S) / ED DIAGNOSES  Final diagnoses:  None      This chart was dictated using voice recognition software.  Despite best efforts to proofread,  errors can occur which can change meaning.      Schuyler Amor, MD 08/10/18 [phone]

## 2018-08-10 NOTE — Discharge Instructions (Signed)
We talked your cardiologist, as you would prefer not to have a shock to your heart at this time, they do recommend that you take flecainide, 100 mg twice a day instead of 50 mg twice a day, and that you increase your diltiazem up to 180 mg a day instead of 120.  That would be 1-1/2 pills of diltiazem instead of just 1.  If you have chest pain, worsening shortness of breath or you feel worse in any way please return to the emergency department.  Please call your cardiologist for an appointment today or first thing tomorrow.

## 2018-08-10 NOTE — Telephone Encounter (Signed)
Left voicemail message to call back  

## 2018-08-10 NOTE — Telephone Encounter (Signed)
I left a message for the patient to call back 

## 2018-08-11 ENCOUNTER — Telehealth: Payer: Self-pay

## 2018-08-11 ENCOUNTER — Encounter: Payer: Self-pay | Admitting: Physician Assistant

## 2018-08-11 ENCOUNTER — Ambulatory Visit: Payer: Medicare Other | Admitting: Physician Assistant

## 2018-08-11 VITALS — BP 122/72 | HR 103 | Ht 59.0 in | Wt 186.5 lb

## 2018-08-11 DIAGNOSIS — I2510 Atherosclerotic heart disease of native coronary artery without angina pectoris: Secondary | ICD-10-CM

## 2018-08-11 DIAGNOSIS — I4892 Unspecified atrial flutter: Secondary | ICD-10-CM

## 2018-08-11 DIAGNOSIS — Z953 Presence of xenogenic heart valve: Secondary | ICD-10-CM | POA: Diagnosis not present

## 2018-08-11 DIAGNOSIS — I10 Essential (primary) hypertension: Secondary | ICD-10-CM

## 2018-08-11 MED ORDER — FLECAINIDE ACETATE 50 MG PO TABS: 50.0000 mg | ORAL_TABLET | Freq: Two times a day (BID) | ORAL | 3 refills | Status: DC

## 2018-08-11 NOTE — Telephone Encounter (Signed)
Pt c/o medication issue:  1. Name of Medication: Diltiazem   2. How are you currently taking this medication (dosage and times per day)? 180 mg twice a day.   3. Are you having a reaction (difficulty breathing--STAT)?  No   4. What is your medication issue?  Patient can't take 1.5 of this, for it is a capsule and can't cut on in half . Also feels it is too high for her.

## 2018-08-11 NOTE — Telephone Encounter (Signed)
Patient calling stating she can't keep waiting for a call back.  States her HR has been racing since yesterday

## 2018-08-11 NOTE — Patient Instructions (Addendum)
Medication Instructions:  No changes at this time so please continue current medications.  If you need a refill on your cardiac medications before your next appointment, please call your pharmacy.   Lab work: No labs needed since you had them done yesterday. If you have labs (blood work) drawn today and your tests are completely normal, you will receive your results only by: [name] MyChart Message (if you have MyChart) OR . A paper copy in the mail If you have any lab test that is abnormal or we need to change your treatment, we will call you to review the results.  Testing/Procedures: You are scheduled for a Cardioversion on _Monday December 9th__ with Dr._Arida_ Please arrive at the [facility] at _08:00_ a.m. on the day of your procedure.  DIET INSTRUCTIONS:  Nothing to eat or drink after midnight except your medications with a small sip of water.         1) Labs: _Done on 12/4/19__  2) Medications:  YOU MAY TAKE ALL of your medications with a small amount of water.  3) Must have a responsible person to drive you home.  4) Bring a current list of your medications and current insurance cards.    If you have any questions after you get home, please call the office at [phone]   Follow-Up: At [facility], you and your health needs are our priority.  As part of our continuing mission to provide you with exceptional heart care, we have created designated Provider Care Teams.  These Care Teams include your primary Cardiologist (physician) and Advanced Practice Providers (APPs -  Physician Assistants and Nurse Practitioners) who all work together to provide you with the care you need, when you need it. You will need a follow up appointment in 1 weeks.  Please call our office 2 months in advance to schedule this appointment.  You may see  or one of the following Advanced Practice Providers on your designated Care Team:   Murray Hodgkins, NP Christell Faith, PA-C . Marrianne Mood,  PA-C  Any Other Special Instructions Will Be Listed Below (If Applicable).

## 2018-08-11 NOTE — Telephone Encounter (Signed)
Lmov for patient to call back She was seen in ED  Will try again at a later time

## 2018-08-11 NOTE — H&P (View-Only) (Signed)
Cardiology Office Note Date:  08/11/2018  Patient ID:  [name], [name] [birth year], MRN [id number] PCP:  [name], MD  Cardiologist:  Dr. Fletcher Anon, MD    Chief Complaint: ED follow up  History of Present Illness: [name] is a 72 y.o. 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  Patient previously underwent successful cardioversion in 08/2016.  She has done well and remained without recurrent arrhythmia on flecainide.  Prior outpatient cardiac monitoring in 02/2017 showed an overall rhythm of normal sinus, 3 brief episodes of narrow complex irregular tachycardia suggesting atrial fibrillation, average heart rate 69 bpm with a minimum heart rate of 37 bpm which happened at 5:54 AM.  There was no bradycardia during the daytime.  She was seen in 08/2017 for chest pain with echocardiogram demonstrating EF of 60 to 65%, moderate concentric LVH, no regional wall motion abnormalities, grade 1 diastolic dysfunction, mild mitral regurgitation, mildly dilated left atrium, RV systolic function was normal, PASP normal.  Nuclear stress test in 08/2017 was low risk with an EF of 55 to 65%.  She was last seen in our office in 10/2017 and was doing reasonably well with no further chest pain and stable shortness of breath.  Since then, she was evaluated at an outside office for gallbladder issues and referred to [facility] cardiology for preoperative cardiac evaluation leading up to potential colonoscopy.  In this setting, she underwent echocardiogram in 04/2018 that  demonstrated an EF of greater than 55%, mild LVH, a bioprosthetic aortic valve with normal morphology and was fully mobile, trivial mitral regurgitation, trivial pulmonic regurgitation, trivial tricuspid regurgitation, normal RV systolic function.  She underwent colonoscopy in 05/2018 without event.  She did hold her Eliquis 1 day prior to this procedure.  Since then, she has not missed any doses of her Eliquis.  Patient was in her usual state of health until approximately 10 AM on 12/4 when she developed sudden onset of tachypalpitations.  She took her heart rate and noticed it was tachycardic ranging in the 1 teens beats per minute.  She noted some increased shortness of breath with this as well.  She presented to the [facility] ED where she was noted to be in atrial flutter with variable AV block and RVR with ventricular rates in the low 100s to 1 teens beats per minute.  Labs showed a negative troponin, potassium 3.7, serum creatinine 0.59, hemoglobin 11.6, and normal TSH.  Case was discussed with cardiology who recommended potential cardioversion in the ED given she was adequately anticoagulated versus increasing her medications.  Patient prefers to increase medications.  At the time of discharge, her ventricular rate was well controlled and her Cardizem CD was increased to 180 mg daily and her flecainide was increased to 100 mg twice daily.  Since being seen in the ED she continues to note tachypalpitations.  She has tolerated the medication increases without issue.  She continues to note some shortness of breath though has not had to increase her supplemental oxygen.  She indicates she has been compliant with her Eliquis without missing any doses since she  held it on 05/23/2018.  No falls since she was last seen.  No BRBPR or melena.  No chest pain, dizziness, presyncope, or syncope.  No lower extremity swelling, abdominal distention, orthopnea, PND, or early satiety.   Past Medical History:  Diagnosis Date    . 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 s/p bioprosthetic valve replacement 2008 at [facility];  b. 01/2015 Echo: EF 60-65%, no rwma, Gr1 DD, mildly dil LA, nl RV fxn.  . Atrial fibrillation with RVR ([place]) 01/11/2015  . Atrial flutter ([place])    a. 08/2016 s/p DCCV.  Remains on flecainide 50 mg bid.  . Basal skull fracture ([place]) 20 yrs ago  . Chronic respiratory failure ([place])   . COPD (chronic obstructive pulmonary disease) ([place])    a. on home O2 at 2L since 2008  . 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 Essential hypertension 01/24/2015  . History of cardiac cath    a. 2008 prior to Aortic aneurysm repair-->nl cors.  . History of stress test    a. 10/2015 MV: no ischemia/infarct.  [name] HLD (hyperlipidemia)   . HTN (hypertension)   . Hypothyroidism   . Obesity   . PAF (paroxysmal atrial fibrillation) ([name])    a. on Eliquis; b. CHADS2VASc = 3 (HTN, age x 1, female).  . Paroxysmal atrial fibrillation ([place]) 01/24/2015  . Right upper quadrant abdominal tenderness without rebound tenderness 03/02/2018  . S/P ascending aortic aneurysm repair 2008    Past Surgical History:  Procedure Laterality Date  . ABDOMINAL AORTIC ANEURYSM REPAIR  2008  . ABDOMINAL HYSTERECTOMY    . AORTIC VALVE REPLACEMENT  2008  . CARDIAC CATHETERIZATION     [facility]  . CARPAL TUNNEL RELEASE    . ELECTROPHYSIOLOGIC STUDY N/A 08/17/2016   Procedure: Cardioversion;  Surgeon: [name], MD;  Location: [facility] ORS;  Service: Cardiovascular;  Laterality: N/A;  . TUMOR EXCISION Left    x3 (arm)    Current Meds  Medication Sig  . acetaminophen (TYLENOL) 500 MG tablet Take 500 mg by mouth 2 (two) times daily as needed for moderate pain or headache.  . albuterol (PROVENTIL HFA;VENTOLIN HFA) 108 (90 Base) MCG/ACT inhaler Inhale 2 puffs into the lungs every 6 (six) hours as needed for wheezing or shortness of breath.  . ALPRAZolam (XANAX) 0.25 MG tablet Take 2 tablets (0.5 mg total) by mouth at  bedtime as needed.  [name] amoxicillin (AMOXIL) 500 MG capsule Take 4 tablets po one time, one hour prior to dental procedure.  [name] atorvastatin (LIPITOR) 40 MG tablet 1 tab po qhs  . Coenzyme Q10 (COQ10) 200 MG CAPS Take 200 mg by mouth daily.  . diclofenac sodium (VOLTAREN) 1 % GEL To the affected joint  . diltiazem (CARDIZEM CD) 120 MG 24 hr capsule Take 1 capsule (120 mg total) by mouth daily.  . DULoxetine (CYMBALTA) 60 MG capsule Take 1 capsule (60 mg total) by mouth 2 (two) times daily. (Patient taking differently: Take 60 mg by mouth daily. )  . ELIQUIS 5 MG TABS tablet Take 1 tablet (5 mg total) by mouth 2 (two) times daily.  . ferrous sulfate (FERROUSUL) 325 (65 FE) MG tablet Take 325 mg by mouth daily with breakfast.  . flecainide (TAMBOCOR) 50 MG tablet Take 1 tablet (50 mg total) by mouth 2 (two) times daily.  . Ipratropium-Albuterol (COMBIVENT) 20-100 MCG/ACT AERS respimat Inhale 1 puff into the lungs  every 6 (six) hours.  . Ketotifen Fumarate (ALAWAY OP) Apply 1 drop to eye daily as needed (allergies).  [name] levothyroxine (SYNTHROID, LEVOTHROID) 25 MCG tablet Take 1 tablet (25 mcg total) by mouth daily before breakfast.  . lisinopril-hydrochlorothiazide (PRINZIDE,ZESTORETIC) 20-25 MG tablet Take 1 tablet by mouth daily.  [name] loratadine (CLARITIN) 10 MG tablet Take 10 mg by mouth daily.  . OXYGEN Inhale into the lungs.  . pantoprazole (PROTONIX) 40 MG tablet Take 1 tablet (40 mg total) by mouth daily.  . potassium chloride (MICRO-K) 10 MEQ CR capsule take 1 capsule po TID (Patient taking differently: 20 mEq. Take 2 in am and 2 tab at pm and pts gets by other  MD)  . sodium chloride (OCEAN) 0.65 % SOLN nasal spray Place 1 spray into both nostrils daily as needed for congestion.  . traZODone (DESYREL) 50 MG tablet Take 2 tablets (100 mg total) by mouth at bedtime. (Patient taking differently: Take 50 mg by mouth at bedtime. )  . [DISCONTINUED] flecainide (TAMBOCOR) 50 MG tablet Take 1 tablet (50  mg total) by mouth 2 (two) times daily.    Allergies:   Benadryl [diphenhydramine hcl (sleep)]; Cetirizine; Diphenhydramine-zinc acetate; Lasix [furosemide]; Levaquin [levofloxacin in d5w]; Levofloxacin; Meloxicam; Soy allergy; and Sulfa antibiotics   Social History:  The patient  reports that she has quit smoking. She has never used smokeless tobacco. She reports that she does not drink alcohol or use drugs.   Family History:  The patient's family history includes Stroke in her father and paternal grandmother.  ROS:   Review of Systems  Constitutional: Positive for malaise/fatigue. Negative for chills, diaphoresis, fever and weight loss.  HENT: Negative for congestion.   Eyes: Negative for discharge and redness.  Respiratory: Positive for shortness of breath. Negative for cough, hemoptysis, sputum production and wheezing.   Cardiovascular: Positive for palpitations. Negative for chest pain, orthopnea, claudication, leg swelling and PND.  Gastrointestinal: Negative for abdominal pain, blood in stool, heartburn, melena, nausea and vomiting.  Genitourinary: Negative for hematuria.  Musculoskeletal: Negative for falls and myalgias.  Skin: Negative for rash.  Neurological: Positive for weakness. Negative for dizziness, tingling, tremors, sensory change, speech change, focal weakness and loss of consciousness.  Endo/Heme/Allergies: Does not bruise/bleed easily.  Psychiatric/Behavioral: Negative for substance abuse. The patient is nervous/anxious.   All other systems reviewed and are negative.    PHYSICAL EXAM:  VS:  BP 122/72 (BP Location: Left Arm, Patient Position: Sitting, Cuff Size: Normal)   Pulse (!) 103   Ht 4\' 11"  (1.499 m)   Wt 186 lb 8 oz (84.6 kg)   SpO2 96%   BMI 37.67 kg/m  BMI: Body mass index is 37.67 kg/m.  Physical Exam  Constitutional: She is oriented to person, place, and time. She appears well-developed and well-nourished.  HENT:  Head: Normocephalic and  atraumatic.  Eyes: Right eye exhibits no discharge. Left eye exhibits no discharge.  Neck: Normal range of motion. No JVD present.  Cardiovascular: S1 normal, S2 normal and normal heart sounds. An irregular rhythm present. Tachycardia present. Exam reveals no distant heart sounds, no friction rub, no midsystolic click and no opening snap.  No murmur heard. Pulses:      Posterior tibial pulses are 2+ on the right side, and 2+ on the left side.  Pulmonary/Chest: Effort normal and breath sounds normal. No respiratory distress. She has no decreased breath sounds. She has no wheezes. She has no rales. She exhibits no tenderness.  Coarse breath  sounds bilaterally  Abdominal: Soft. She exhibits no distension. There is no tenderness.  Musculoskeletal: She exhibits no edema.  Neurological: She is alert and oriented to person, place, and time.  Skin: Skin is warm and dry. No cyanosis. Nails show no clubbing.  Psychiatric: She has a normal mood and affect. Her speech is normal and behavior is normal. Judgment and thought content normal.     EKG:  Was ordered and interpreted by me today. Shows atrial flutter with variable AV block and RVR, 103 bpm, left axis deviation, LVH with repolarization abnormality, poor R wave progression, nonspecific st/t changes (not new)  Recent Labs: 08/10/2018: BUN 11; Creatinine, Ser 0.59; Hemoglobin 11.6; Platelets 293; Potassium 3.7; Sodium 135; TSH 2.191  No results found for requested labs within last 8760 hours.   Estimated Creatinine Clearance: 61.8 mL/min (by C-G formula based on SCr of 0.59 mg/dL).   Wt Readings from Last 3 Encounters:  08/11/18 186 lb 8 oz (84.6 kg)  08/10/18 186 lb (84.4 kg)  08/08/18 186 lb (84.4 kg)     Other studies reviewed: Additional studies/records reviewed today include: summarized above  ASSESSMENT AND PLAN:  1. Paroxysmal atrial flutter: Ventricular rate is well controlled at this time.  Case was discussed with her primary  cardiologist who recommends proceeding with electrical cardioversion.  I confirmed with the patient that she has not missed any doses of her Eliquis dating back to 05/2018.  Continue Cardizem and flecainide.  She has also been referred to EP for consideration of atrial flutter ablation.  Given her LVH with a ventricular septum measuring 17 mm, ultimately we may need to consider an alternative medication in place of a flecainide.  2. Non-obstructive coronary artery disease: No symptoms concerning for angina.  Remains on Eliquis in place of aspirin.  Continue Lipitor.  Not on beta-blocker in the setting of her underlying pulmonary disease.  3. Hypertension: Blood pressure is well controlled today.  Continue current medications.  4. History of bioprosthetic aortic valve replacement: Device appears to be functioning normally on most recent echocardiogram.  Continue to monitor.  5. Subclinical hypokalemia: Could consider transitioning to spironolactone in follow-up in place of her potassium chloride.  Disposition: F/u with Dr. Fletcher Anon or APP in 2 weeks.  Current medicines are reviewed at length with the patient today.  The patient did not have any concerns regarding medicines.  Signed, Christell Faith, PA-C 08/11/2018 4:17 PM     [address] [place], [place] [address] [phone]

## 2018-08-11 NOTE — Telephone Encounter (Signed)
Patient seen today in the office and is scheduled for procedure on Monday. No other questions at this time.

## 2018-08-11 NOTE — Progress Notes (Signed)
Cardiology Office Note Date:  08/11/2018  Patient ID:  [name], [name] 1946/05/01, MRN [id number] PCP:  [name], MD  Cardiologist:  Dr. Fletcher Anon, MD    Chief Complaint: ED follow up  History of Present Illness: [name] is a 72 y.o. 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  Patient previously underwent successful cardioversion in 08/2016.  She has done well and remained without recurrent arrhythmia on flecainide.  Prior outpatient cardiac monitoring in 02/2017 showed an overall rhythm of normal sinus, 3 brief episodes of narrow complex irregular tachycardia suggesting atrial fibrillation, average heart rate 69 bpm with a minimum heart rate of 37 bpm which happened at 5:54 AM.  There was no bradycardia during the daytime.  She was seen in 08/2017 for chest pain with echocardiogram demonstrating EF of 60 to 65%, moderate concentric LVH, no regional wall motion abnormalities, grade 1 diastolic dysfunction, mild mitral regurgitation, mildly dilated left atrium, RV systolic function was normal, PASP normal.  Nuclear stress test in 08/2017 was low risk with an EF of 55 to 65%.  She was last seen in our office in 10/2017 and was doing reasonably well with no further chest pain and stable shortness of breath.  Since then, she was evaluated at an outside office for gallbladder issues and referred to [facility] cardiology for preoperative cardiac evaluation leading up to potential colonoscopy.  In this setting, she underwent echocardiogram in 04/2018 that  demonstrated an EF of greater than 55%, mild LVH, a bioprosthetic aortic valve with normal morphology and was fully mobile, trivial mitral regurgitation, trivial pulmonic regurgitation, trivial tricuspid regurgitation, normal RV systolic function.  She underwent colonoscopy in 05/2018 without event.  She did hold her Eliquis 1 day prior to this procedure.  Since then, she has not missed any doses of her Eliquis.  Patient was in her usual state of health until approximately 10 AM on 12/4 when she developed sudden onset of tachypalpitations.  She took her heart rate and noticed it was tachycardic ranging in the 1 teens beats per minute.  She noted some increased shortness of breath with this as well.  She presented to the [facility] ED where she was noted to be in atrial flutter with variable AV block and RVR with ventricular rates in the low 100s to 1 teens beats per minute.  Labs showed a negative troponin, potassium 3.7, serum creatinine 0.59, hemoglobin 11.6, and normal TSH.  Case was discussed with cardiology who recommended potential cardioversion in the ED given she was adequately anticoagulated versus increasing her medications.  Patient prefers to increase medications.  At the time of discharge, her ventricular rate was well controlled and her Cardizem CD was increased to 180 mg daily and her flecainide was increased to 100 mg twice daily.  Since being seen in the ED she continues to note tachypalpitations.  She has tolerated the medication increases without issue.  She continues to note some shortness of breath though has not had to increase her supplemental oxygen.  She indicates she has been compliant with her Eliquis without missing any doses since she  held it on 05/23/2018.  No falls since she was last seen.  No BRBPR or melena.  No chest pain, dizziness, presyncope, or syncope.  No lower extremity swelling, abdominal distention, orthopnea, PND, or early satiety.   Past Medical History:  Diagnosis Date    . 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 s/p bioprosthetic valve replacement 2008 at [facility];  b. 01/2015 Echo: EF 60-65%, no rwma, Gr1 DD, mildly dil LA, nl RV fxn.  . Atrial fibrillation with RVR ([place]) 01/11/2015  . Atrial flutter ([place])    a. 08/2016 s/p DCCV.  Remains on flecainide 50 mg bid.  . Basal skull fracture ([place]) 20 yrs ago  . Chronic respiratory failure ([place])   . COPD (chronic obstructive pulmonary disease) ([place])    a. on home O2 at 2L since 2008  . 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 Essential hypertension 01/24/2015  . History of cardiac cath    a. 2008 prior to Aortic aneurysm repair-->nl cors.  . History of stress test    a. 10/2015 MV: no ischemia/infarct.  [name] HLD (hyperlipidemia)   . HTN (hypertension)   . Hypothyroidism   . Obesity   . PAF (paroxysmal atrial fibrillation) ([name])    a. on Eliquis; b. CHADS2VASc = 3 (HTN, age x 1, female).  . Paroxysmal atrial fibrillation ([place]) 01/24/2015  . Right upper quadrant abdominal tenderness without rebound tenderness 03/02/2018  . S/P ascending aortic aneurysm repair 2008    Past Surgical History:  Procedure Laterality Date  . ABDOMINAL AORTIC ANEURYSM REPAIR  2008  . ABDOMINAL HYSTERECTOMY    . AORTIC VALVE REPLACEMENT  2008  . CARDIAC CATHETERIZATION     [facility]  . CARPAL TUNNEL RELEASE    . ELECTROPHYSIOLOGIC STUDY N/A 08/17/2016   Procedure: Cardioversion;  Surgeon: [name], MD;  Location: [facility] ORS;  Service: Cardiovascular;  Laterality: N/A;  . TUMOR EXCISION Left    x3 (arm)    Current Meds  Medication Sig  . acetaminophen (TYLENOL) 500 MG tablet Take 500 mg by mouth 2 (two) times daily as needed for moderate pain or headache.  . albuterol (PROVENTIL HFA;VENTOLIN HFA) 108 (90 Base) MCG/ACT inhaler Inhale 2 puffs into the lungs every 6 (six) hours as needed for wheezing or shortness of breath.  . ALPRAZolam (XANAX) 0.25 MG tablet Take 2 tablets (0.5 mg total) by mouth at  bedtime as needed.  [name] amoxicillin (AMOXIL) 500 MG capsule Take 4 tablets po one time, one hour prior to dental procedure.  [name] atorvastatin (LIPITOR) 40 MG tablet 1 tab po qhs  . Coenzyme Q10 (COQ10) 200 MG CAPS Take 200 mg by mouth daily.  . diclofenac sodium (VOLTAREN) 1 % GEL To the affected joint  . diltiazem (CARDIZEM CD) 120 MG 24 hr capsule Take 1 capsule (120 mg total) by mouth daily.  . DULoxetine (CYMBALTA) 60 MG capsule Take 1 capsule (60 mg total) by mouth 2 (two) times daily. (Patient taking differently: Take 60 mg by mouth daily. )  . ELIQUIS 5 MG TABS tablet Take 1 tablet (5 mg total) by mouth 2 (two) times daily.  . ferrous sulfate (FERROUSUL) 325 (65 FE) MG tablet Take 325 mg by mouth daily with breakfast.  . flecainide (TAMBOCOR) 50 MG tablet Take 1 tablet (50 mg total) by mouth 2 (two) times daily.  . Ipratropium-Albuterol (COMBIVENT) 20-100 MCG/ACT AERS respimat Inhale 1 puff into the lungs  every 6 (six) hours.  . Ketotifen Fumarate (ALAWAY OP) Apply 1 drop to eye daily as needed (allergies).  [name] levothyroxine (SYNTHROID, LEVOTHROID) 25 MCG tablet Take 1 tablet (25 mcg total) by mouth daily before breakfast.  . lisinopril-hydrochlorothiazide (PRINZIDE,ZESTORETIC) 20-25 MG tablet Take 1 tablet by mouth daily.  [name] loratadine (CLARITIN) 10 MG tablet Take 10 mg by mouth daily.  . OXYGEN Inhale into the lungs.  . pantoprazole (PROTONIX) 40 MG tablet Take 1 tablet (40 mg total) by mouth daily.  . potassium chloride (MICRO-K) 10 MEQ CR capsule take 1 capsule po TID (Patient taking differently: 20 mEq. Take 2 in am and 2 tab at pm and pts gets by other  MD)  . sodium chloride (OCEAN) 0.65 % SOLN nasal spray Place 1 spray into both nostrils daily as needed for congestion.  . traZODone (DESYREL) 50 MG tablet Take 2 tablets (100 mg total) by mouth at bedtime. (Patient taking differently: Take 50 mg by mouth at bedtime. )  . [DISCONTINUED] flecainide (TAMBOCOR) 50 MG tablet Take 1 tablet (50  mg total) by mouth 2 (two) times daily.    Allergies:   Benadryl [diphenhydramine hcl (sleep)]; Cetirizine; Diphenhydramine-zinc acetate; Lasix [furosemide]; Levaquin [levofloxacin in d5w]; Levofloxacin; Meloxicam; Soy allergy; and Sulfa antibiotics   Social History:  The patient  reports that she has quit smoking. She has never used smokeless tobacco. She reports that she does not drink alcohol or use drugs.   Family History:  The patient's family history includes Stroke in her father and paternal grandmother.  ROS:   Review of Systems  Constitutional: Positive for malaise/fatigue. Negative for chills, diaphoresis, fever and weight loss.  HENT: Negative for congestion.   Eyes: Negative for discharge and redness.  Respiratory: Positive for shortness of breath. Negative for cough, hemoptysis, sputum production and wheezing.   Cardiovascular: Positive for palpitations. Negative for chest pain, orthopnea, claudication, leg swelling and PND.  Gastrointestinal: Negative for abdominal pain, blood in stool, heartburn, melena, nausea and vomiting.  Genitourinary: Negative for hematuria.  Musculoskeletal: Negative for falls and myalgias.  Skin: Negative for rash.  Neurological: Positive for weakness. Negative for dizziness, tingling, tremors, sensory change, speech change, focal weakness and loss of consciousness.  Endo/Heme/Allergies: Does not bruise/bleed easily.  Psychiatric/Behavioral: Negative for substance abuse. The patient is nervous/anxious.   All other systems reviewed and are negative.    PHYSICAL EXAM:  VS:  BP 122/72 (BP Location: Left Arm, Patient Position: Sitting, Cuff Size: Normal)   Pulse (!) 103   Ht 4\' 11"  (1.499 m)   Wt 186 lb 8 oz (84.6 kg)   SpO2 96%   BMI 37.67 kg/m  BMI: Body mass index is 37.67 kg/m.  Physical Exam  Constitutional: She is oriented to person, place, and time. She appears well-developed and well-nourished.  HENT:  Head: Normocephalic and  atraumatic.  Eyes: Right eye exhibits no discharge. Left eye exhibits no discharge.  Neck: Normal range of motion. No JVD present.  Cardiovascular: S1 normal, S2 normal and normal heart sounds. An irregular rhythm present. Tachycardia present. Exam reveals no distant heart sounds, no friction rub, no midsystolic click and no opening snap.  No murmur heard. Pulses:      Posterior tibial pulses are 2+ on the right side, and 2+ on the left side.  Pulmonary/Chest: Effort normal and breath sounds normal. No respiratory distress. She has no decreased breath sounds. She has no wheezes. She has no rales. She exhibits no tenderness.  Coarse breath  sounds bilaterally  Abdominal: Soft. She exhibits no distension. There is no tenderness.  Musculoskeletal: She exhibits no edema.  Neurological: She is alert and oriented to person, place, and time.  Skin: Skin is warm and dry. No cyanosis. Nails show no clubbing.  Psychiatric: She has a normal mood and affect. Her speech is normal and behavior is normal. Judgment and thought content normal.     EKG:  Was ordered and interpreted by me today. Shows atrial flutter with variable AV block and RVR, 103 bpm, left axis deviation, LVH with repolarization abnormality, poor R wave progression, nonspecific st/t changes (not new)  Recent Labs: 08/10/2018: BUN 11; Creatinine, Ser 0.59; Hemoglobin 11.6; Platelets 293; Potassium 3.7; Sodium 135; TSH 2.191  No results found for requested labs within last 8760 hours.   Estimated Creatinine Clearance: 61.8 mL/min (by C-G formula based on SCr of 0.59 mg/dL).   Wt Readings from Last 3 Encounters:  08/11/18 186 lb 8 oz (84.6 kg)  08/10/18 186 lb (84.4 kg)  08/08/18 186 lb (84.4 kg)     Other studies reviewed: Additional studies/records reviewed today include: summarized above  ASSESSMENT AND PLAN:  1. Paroxysmal atrial flutter: Ventricular rate is well controlled at this time.  Case was discussed with her primary  cardiologist who recommends proceeding with electrical cardioversion.  I confirmed with the patient that she has not missed any doses of her Eliquis dating back to 05/2018.  Continue Cardizem and flecainide.  She has also been referred to EP for consideration of atrial flutter ablation.  Given her LVH with a ventricular septum measuring 17 mm, ultimately we may need to consider an alternative medication in place of a flecainide.  2. Non-obstructive coronary artery disease: No symptoms concerning for angina.  Remains on Eliquis in place of aspirin.  Continue Lipitor.  Not on beta-blocker in the setting of her underlying pulmonary disease.  3. Hypertension: Blood pressure is well controlled today.  Continue current medications.  4. History of bioprosthetic aortic valve replacement: Device appears to be functioning normally on most recent echocardiogram.  Continue to monitor.  5. Subclinical hypokalemia: Could consider transitioning to spironolactone in follow-up in place of her potassium chloride.  Disposition: F/u with Dr. Fletcher Anon or APP in 2 weeks.  Current medicines are reviewed at length with the patient today.  The patient did not have any concerns regarding medicines.  Signed, Andrea Faith, PA-C 08/11/2018 4:17 PM     [address] [place], [place] [address] [phone]

## 2018-08-11 NOTE — Telephone Encounter (Signed)
Patient was seen today in clinic. She is scheduled for procedure Monday with no further questions.

## 2018-08-12 NOTE — Telephone Encounter (Signed)
Seen 12/5

## 2018-08-12 NOTE — Addendum Note (Signed)
Addended by: Valora Corporal on: 08/12/2018 09:31 AM   Modules accepted: Orders

## 2018-08-13 ENCOUNTER — Telehealth: Payer: Self-pay | Admitting: Physician Assistant

## 2018-08-13 NOTE — Telephone Encounter (Signed)
Patient called with "did not got RX of Flecainide 100mg  BID". Reviewed with Thurmond Butts Dunn>> plan to continue current dose at 50mg  BID per Dr. Fletcher Anon. Cardioversion as planned on Monday.

## 2018-08-15 ENCOUNTER — Encounter
Admission: RE | Disposition: A | Discharge status: Home / Self Care | Payer: Self-pay | Source: Home / Self Care | Attending: Cardiovascular Disease

## 2018-08-15 ENCOUNTER — Ambulatory Visit: Payer: Medicare Other | Admitting: Anesthesiology

## 2018-08-15 ENCOUNTER — Other Ambulatory Visit: Payer: Self-pay

## 2018-08-15 ENCOUNTER — Ambulatory Visit
Admission: RE | Admit: 2018-08-15 | Discharge: 2018-08-15 | Disposition: A | Discharge status: Home / Self Care | Payer: Medicare Other | Attending: Cardiovascular Disease | Admitting: Cardiovascular Disease

## 2018-08-15 ENCOUNTER — Telehealth: Payer: Self-pay | Admitting: Cardiovascular Disease

## 2018-08-15 DIAGNOSIS — Z79890 Hormone replacement therapy (postmenopausal): Secondary | ICD-10-CM | POA: Insufficient documentation

## 2018-08-15 DIAGNOSIS — H9192 Unspecified hearing loss, left ear: Secondary | ICD-10-CM | POA: Diagnosis not present

## 2018-08-15 DIAGNOSIS — J9611 Chronic respiratory failure with hypoxia: Secondary | ICD-10-CM | POA: Diagnosis not present

## 2018-08-15 DIAGNOSIS — J449 Chronic obstructive pulmonary disease, unspecified: Secondary | ICD-10-CM | POA: Insufficient documentation

## 2018-08-15 DIAGNOSIS — E669 Obesity, unspecified: Secondary | ICD-10-CM | POA: Diagnosis not present

## 2018-08-15 DIAGNOSIS — I2510 Atherosclerotic heart disease of native coronary artery without angina pectoris: Secondary | ICD-10-CM | POA: Diagnosis not present

## 2018-08-15 DIAGNOSIS — K219 Gastro-esophageal reflux disease without esophagitis: Secondary | ICD-10-CM | POA: Insufficient documentation

## 2018-08-15 DIAGNOSIS — G4730 Sleep apnea, unspecified: Secondary | ICD-10-CM | POA: Insufficient documentation

## 2018-08-15 DIAGNOSIS — E876 Hypokalemia: Secondary | ICD-10-CM | POA: Insufficient documentation

## 2018-08-15 DIAGNOSIS — I4892 Unspecified atrial flutter: Secondary | ICD-10-CM

## 2018-08-15 DIAGNOSIS — I480 Paroxysmal atrial fibrillation: Secondary | ICD-10-CM | POA: Insufficient documentation

## 2018-08-15 DIAGNOSIS — E785 Hyperlipidemia, unspecified: Secondary | ICD-10-CM | POA: Diagnosis not present

## 2018-08-15 DIAGNOSIS — I4891 Unspecified atrial fibrillation: Secondary | ICD-10-CM | POA: Diagnosis not present

## 2018-08-15 DIAGNOSIS — Z9981 Dependence on supplemental oxygen: Secondary | ICD-10-CM | POA: Insufficient documentation

## 2018-08-15 DIAGNOSIS — I10 Essential (primary) hypertension: Secondary | ICD-10-CM | POA: Diagnosis not present

## 2018-08-15 DIAGNOSIS — Z7901 Long term (current) use of anticoagulants: Secondary | ICD-10-CM | POA: Insufficient documentation

## 2018-08-15 DIAGNOSIS — E039 Hypothyroidism, unspecified: Secondary | ICD-10-CM | POA: Diagnosis not present

## 2018-08-15 DIAGNOSIS — Z79899 Other long term (current) drug therapy: Secondary | ICD-10-CM | POA: Diagnosis not present

## 2018-08-15 DIAGNOSIS — Z6837 Body mass index (BMI) 37.0-37.9, adult: Secondary | ICD-10-CM | POA: Insufficient documentation

## 2018-08-15 DIAGNOSIS — Z87891 Personal history of nicotine dependence: Secondary | ICD-10-CM | POA: Insufficient documentation

## 2018-08-15 DIAGNOSIS — Z953 Presence of xenogenic heart valve: Secondary | ICD-10-CM | POA: Insufficient documentation

## 2018-08-15 HISTORY — PX: CARDIOVERSION: EP1203

## 2018-08-15 SURGERY — CARDIOVERSION (CATH LAB)
Anesthesia: General

## 2018-08-15 MED ORDER — SODIUM CHLORIDE 0.9 % IV SOLN: INTRAVENOUS | Status: DC | PRN

## 2018-08-15 MED ORDER — SODIUM CHLORIDE 0.9 % IV SOLN
INTRAVENOUS | Status: DC | PRN
  Administered 2018-08-15: 09:00:00 via INTRAVENOUS

## 2018-08-15 MED ORDER — PROPOFOL 10 MG/ML IV BOLUS
INTRAVENOUS | Status: DC | PRN
  Administered 2018-08-15: 60 mg via INTRAVENOUS

## 2018-08-15 NOTE — Interval H&P Note (Signed)
History and Physical Interval Note:  08/15/2018 9:03 AM  [name]  has presented today for surgery, with the diagnosis of Cardioversion   Afib  OK 2nd case per Leah  START TIME 9a  The various methods of treatment have been discussed with the patient and family. After consideration of risks, benefits and other options for treatment, the patient has consented to  Procedure(s): CARDIOVERSION (N/A) as a surgical intervention .  The patient's history has been reviewed, patient examined, no change in status, stable for surgery.  I have reviewed the patient's chart and labs.  Questions were answered to the patient's satisfaction.     [name]

## 2018-08-15 NOTE — Telephone Encounter (Signed)
Pt c/o BP issue: STAT if pt c/o blurred vision, one-sided weakness or slurred speech  1. What are your last 5 BP readings? 106/66  2. Are you having any other symptoms (ex. Dizziness, headache, blurred vision, passed out)? Patient c/o symptomatic faint blood rushing to head  3. What is your BP issue? Feels like this is too low thinks she came home today from cardioversion too soon please call

## 2018-08-15 NOTE — Anesthesia Postprocedure Evaluation (Signed)
Anesthesia Post Note  Patient: [name]  Procedure(s) Performed: CARDIOVERSION (N/A )  Patient location during evaluation: PACU Anesthesia Type: General Level of consciousness: awake and alert and oriented Pain management: pain level controlled Vital Signs Assessment: post-procedure vital signs reviewed and stable Respiratory status: spontaneous breathing Cardiovascular status: blood pressure returned to baseline Anesthetic complications: no     Last Vitals:  Vitals:   08/15/18 0915 08/15/18 0930  BP: 126/83 133/73  Pulse: 84 86  Resp: 15 20  Temp:    SpO2: 95% 94%    Last Pain:  Vitals:   08/15/18 0930  TempSrc:   PainSc: 0-No pain                 [name]

## 2018-08-15 NOTE — CV Procedure (Signed)
Cardioversion note: A standard informed consent was obtained. Timeout was performed. The pads were placed in the anterior posterior fashion. The patient was given propofol by the anesthesia team.  Successful cardioversion was performed with a 100 J. The patient converted to sinus rhythm. Pre-and post EKGs were reviewed. The patient tolerated the procedure with no immediate complications.  Recommendations: Continue same medications and follow-up in 2-3 weeks.

## 2018-08-15 NOTE — Progress Notes (Signed)
Cardiology Office Note Date:  08/17/2018  Patient ID:  [name], [name] 11-19-1945, MRN [id number] PCP:  [name], MD  Cardiologist:  Dr. Fletcher Anon, MD    Chief Complaint: Follow up DCCV  History of Present Illness: [name] is a 72 y.o. 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   Patient previously underwent successful cardioversion in 08/2016.  She had done well and remained without recurrent arrhythmia on flecainide over the past couple of years.  Prior outpatient cardiac monitoring in 02/2017 showed an overall rhythm of normal sinus, 3 brief episodes of narrow complex irregular tachycardia suggesting atrial fibrillation, average heart rate 69 bpm with a minimum heart rate of 37 bpm which happened at 5:54 AM.  There was no bradycardia during the daytime.  She was seen in 08/2017 for chest pain with echocardiogram demonstrating EF of 60 to 65%, moderate concentric LVH, no regional wall motion abnormalities, grade 1 diastolic dysfunction, mild mitral regurgitation, mildly dilated left atrium, RV systolic function was normal, PASP normal.  Nuclear stress test in 08/2017 was low risk with an EF of 55 to 65%.  She was seen in our office in 10/2017 and was doing reasonably well with no further chest pain and stable shortness of breath.  Since then, she was evaluated at an outside office for gallbladder issues and referred to [facility] cardiology for preoperative cardiac evaluation leading up to potential colonoscopy.  In this  setting, she underwent echocardiogram in 04/2018 that demonstrated an EF of greater than 55%, mild LVH, a bioprosthetic aortic valve with normal morphology and was fully mobile, trivial mitral regurgitation, trivial pulmonic regurgitation, trivial tricuspid regurgitation, normal RV systolic function.  She underwent colonoscopy in 05/2018 without event.  She did hold her Eliquis 1 day prior to this procedure.  Since then, she had not missed any doses of her Eliquis.  She was seen in the ED on 12/4 with sudden onset of tachy-palpitations that began around 10 AM that day.  She was noted to be in atrial flutter with variable AV block with RVR with ventricular rates in the low 100s to 110s bpm.  Labs showed a potassium of 3.7, SCr 0.59, HGB 11.6, normal TSH.  The ED increased her Cardizem CD to 180 mg daily as well as her Flecainide to 100 mg bid.  She was seen in the office on 12/5 for ED follow up and remained in Afib with variable AV block.  Ventricular rates were well controlled in the 80s bpm.  She was scheduled for DCCV on 12/9 and referred to EP for consideration of atrial flutter ablation.  She presented to [facility] on 12/9 and underwent successful DCCV.   She comes in doing well today from a cardiac perspective.  Following her cardioversion she did have some soft blood pressures leading to the decrease in her Cardizem CD to 120 mg daily.  Her flecainide was also decreased to 50 mg twice daily.  However, over the past couple of days her blood pressure has been trending into the 361W to 431V systolic.  She has not  had any further palpitations concerning for further A. fib/flutter.  She has been compliant with her Eliquis and has not missed any doses.  No chest pain, lower extremity swelling, worsening shortness of breath, dizziness, presyncope, or syncope.  She reports her sister, who drives her to appointments, prefers the patient undergo consideration for atrial flutter ablation at [facility] rather than at [facility].   The patient indicates that she is somewhat at the mercy of her sister given she has to drive her around from appointment to appointment as the patient does not drive.  The patient would like to, at a minimum, visit with EP within our group to discuss possible ablation prior to following her sister's wishes.  She has been called from our office to schedule an appointment with EP though has not yet made this appointment.  Past Medical History:  Diagnosis Date  . 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 s/p bioprosthetic valve replacement 2008 at [facility];  b. 01/2015 Echo: EF 60-65%, no rwma, Gr1 DD, mildly dil LA, nl RV fxn.  . Atrial fibrillation with RVR ([place]) 01/11/2015  . Atrial flutter ([place])    a. 08/2016 s/p DCCV.  Remains on flecainide 50 mg bid.  . Basal skull fracture ([place]) 20 yrs ago  . Chronic respiratory failure ([place])   . COPD (chronic obstructive pulmonary disease) ([place])    a. on home O2 at 2L since 2008  . 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 Essential hypertension 01/24/2015  . History of cardiac cath    a. 2008 prior to Aortic aneurysm repair-->nl cors.  . History of stress test    a. 10/2015 MV: no ischemia/infarct.  [name] HLD (hyperlipidemia)   . HTN (hypertension)   . Hypothyroidism   . Obesity   . PAF (paroxysmal atrial fibrillation) ([name])    a. on Eliquis; b. CHADS2VASc = 3 (HTN, age x 1, female).  . Paroxysmal atrial fibrillation ([place]) 01/24/2015  . Right upper quadrant abdominal tenderness without rebound tenderness 03/02/2018  . S/P ascending aortic aneurysm repair 2008    Past Surgical History:  Procedure Laterality Date  . ABDOMINAL AORTIC ANEURYSM REPAIR  2008  . ABDOMINAL HYSTERECTOMY    . AORTIC VALVE REPLACEMENT  2008  . CARDIAC CATHETERIZATION     [facility]  . CARDIOVERSION N/A 08/15/2018   Procedure: CARDIOVERSION;  Surgeon: [name], MD;  Location: [facility] ORS;  Service: Cardiovascular;  Laterality: N/A;  . CARPAL TUNNEL  RELEASE    . ELECTROPHYSIOLOGIC STUDY N/A 08/17/2016   Procedure: Cardioversion;  Surgeon: [name], MD;  Location: [facility] ORS;  Service: Cardiovascular;  Laterality: N/A;  . TUMOR EXCISION Left    x3 (arm)    Current Meds  Medication Sig  . acetaminophen (TYLENOL) 500 MG tablet Take 500 mg by mouth 2 (two) times daily as needed for moderate pain or headache.  . albuterol (PROVENTIL HFA;VENTOLIN HFA) 108 (90 Base) MCG/ACT inhaler Inhale 2 puffs into the lungs every 6 (six) hours as needed for wheezing or shortness of breath.  . ALPRAZolam (XANAX) 0.25 MG tablet Take 2 tablets (0.5 mg total) by mouth at bedtime as needed.  [name] atorvastatin (LIPITOR) 40 MG tablet 1 tab po qhs  . Coenzyme Q10 (COQ10) 200 MG CAPS Take 200 mg by mouth daily.  . diclofenac sodium (VOLTAREN) 1 % GEL To the affected joint  . DULoxetine (CYMBALTA) 60 MG capsule Take 1 capsule (  60 mg total) by mouth 2 (two) times daily. (Patient taking differently: Take 60 mg by mouth daily. )  . ELIQUIS 5 MG TABS tablet Take 1 tablet (5 mg total) by mouth 2 (two) times daily.  . ferrous sulfate (FERROUSUL) 325 (65 FE) MG tablet Take 325 mg by mouth daily with breakfast.  . flecainide (TAMBOCOR) 50 MG tablet Take 1 tablet (50 mg total) by mouth 2 (two) times daily.  . Ipratropium-Albuterol (COMBIVENT) 20-100 MCG/ACT AERS respimat Inhale 1 puff into the lungs every 6 (six) hours.  . Ketotifen Fumarate (ALAWAY OP) Apply 1 drop to eye daily as needed (allergies).  [name] levothyroxine (SYNTHROID, LEVOTHROID) 25 MCG tablet Take 1 tablet (25 mcg total) by mouth daily before breakfast.  . lisinopril-hydrochlorothiazide (PRINZIDE,ZESTORETIC) 20-25 MG tablet Take 1 tablet by mouth daily.  [name] loratadine (CLARITIN) 10 MG tablet Take 10 mg by mouth daily.  . OXYGEN Inhale into the lungs.  . pantoprazole (PROTONIX) 40 MG tablet Take 1 tablet (40 mg total) by mouth daily.  . potassium chloride (MICRO-K) 10 MEQ CR capsule take 1 capsule po TID  (Patient taking differently: 20 mEq. Take 2 in am and 2 tab at pm and pts gets by other  MD)  . sodium chloride (OCEAN) 0.65 % SOLN nasal spray Place 1 spray into both nostrils daily as needed for congestion.  . traZODone (DESYREL) 50 MG tablet Take 2 tablets (100 mg total) by mouth at bedtime. (Patient taking differently: Take 50 mg by mouth at bedtime. )  . [DISCONTINUED] diltiazem (CARDIZEM CD) 120 MG 24 hr capsule Take 1 capsule (120 mg total) by mouth daily. (Patient taking differently: Take 180 mg by mouth daily. )    Allergies:   Benadryl [diphenhydramine hcl (sleep)]; Cetirizine; Diphenhydramine-zinc acetate; Lasix [furosemide]; Levaquin [levofloxacin in d5w]; Levofloxacin; Meloxicam; Soy allergy; and Sulfa antibiotics   Social History:  The patient  reports that she has quit smoking. She has never used smokeless tobacco. She reports that she does not drink alcohol or use drugs.   Family History:  The patient's family history includes Stroke in her father and paternal grandmother.  ROS:   Review of Systems  Constitutional: Positive for malaise/fatigue. Negative for chills, diaphoresis, fever and weight loss.  HENT: Negative for congestion.   Eyes: Negative for discharge and redness.  Respiratory: Positive for shortness of breath. Negative for cough, hemoptysis, sputum production and wheezing.        Chronic, stable dyspnea  Cardiovascular: Negative for chest pain, palpitations, orthopnea, claudication, leg swelling and PND.  Gastrointestinal: Negative for abdominal pain, blood in stool, heartburn, melena, nausea and vomiting.  Genitourinary: Negative for hematuria.  Musculoskeletal: Positive for joint pain. Negative for falls and myalgias.  Skin: Negative for rash.  Neurological: Positive for weakness. Negative for dizziness, tingling, tremors, sensory change, speech change, focal weakness and loss of consciousness.  Endo/Heme/Allergies: Does not bruise/bleed easily.    Psychiatric/Behavioral: Negative for substance abuse. The patient is not nervous/anxious.   All other systems reviewed and are negative.    PHYSICAL EXAM:  VS:  BP (!) 161/84 (BP Location: Left Arm, Patient Position: Sitting, Cuff Size: Normal)   Pulse 81   Ht 4\' 10"  (1.473 m)   Wt 185 lb 8 oz (84.1 kg)   BMI 38.77 kg/m  BMI: Body mass index is 38.77 kg/m.  Physical Exam  Constitutional: She is oriented to person, place, and time. She appears well-developed and well-nourished.  HENT:  Head: Normocephalic and atraumatic.  Eyes:  Right eye exhibits no discharge. Left eye exhibits no discharge.  Neck: Normal range of motion. No JVD present.  Cardiovascular: Normal rate, regular rhythm, S1 normal and S2 normal. Exam reveals no distant heart sounds, no friction rub, no midsystolic click and no opening snap.  Murmur heard. High-pitched blowing holosystolic murmur is present with a grade of 1/6 at the apex. Pulses:      Posterior tibial pulses are 2+ on the right side, and 2+ on the left side.  Pulmonary/Chest: Effort normal and breath sounds normal. No respiratory distress. She has no decreased breath sounds. She has no wheezes. She has no rales. She exhibits no tenderness.  Abdominal: Soft. She exhibits no distension. There is no tenderness.  Musculoskeletal: She exhibits no edema.  Neurological: She is alert and oriented to person, place, and time.  Skin: Skin is warm and dry. No cyanosis. Nails show no clubbing.  Psychiatric: She has a normal mood and affect. Her speech is normal and behavior is normal. Judgment and thought content normal.     EKG:  Was ordered and interpreted by me today. Shows NSR, 81 bpm, LVH, left axis deviation, lateral st/t changes  Recent Labs: 08/10/2018: BUN 11; Creatinine, Ser 0.59; Hemoglobin 11.6; Platelets 293; Potassium 3.7; Sodium 135; TSH 2.191  No results found for requested labs within last 8760 hours.   Estimated Creatinine Clearance: 60.1  mL/min (by C-G formula based on SCr of 0.59 mg/dL).   Wt Readings from Last 3 Encounters:  08/17/18 185 lb 8 oz (84.1 kg)  08/11/18 186 lb 8 oz (84.6 kg)  08/10/18 186 lb (84.4 kg)     Other studies reviewed: Additional studies/records reviewed today include: summarized above  ASSESSMENT AND PLAN:  1. Paroxysmal atrial flutter: She underwent successful electrical cardioversion on 08/15/2018 and remains in sinus rhythm with a heart rate of 81 bpm at this time.  In the setting of her heart rate in the 80s and suboptimally controlled blood pressure, we will titrate her Cardizem CD to 180 mg daily.  Continue Eliquis 5 mg twice daily.  She has been referred to EP for consideration of atrial flutter ablation.  Patient indicates her sister refuses to drive her to [facility] for this and prefers the patient undergo consideration of atrial flutter ablation at [facility].  The patient would like to at least visit with Dr. [name] for consideration of atrial flutter ablation.  She will contact our office to schedule this appointment as the referral has already been made.  Given her LVH with a ventricular septum measuring 17 mm, ultimately we will possibly need to consider alternative medication in place of flecainide.  2. Nonobstructive CAD: No symptoms concerning for angina.  On Eliquis in place of aspirin.  Continue Lipitor.  Not on a beta-blocker in the setting of her underlying pulmonary disease.  3. HTN: Blood pressure is suboptimally controlled today.  Increase Cardizem CD to 180 mg daily.  Continue lisinopril/HCTZ 20/25 mg daily.  Call if blood pressure readings remain elevated.  4. HLD: Remains on Lipitor 40 mg daily.  5. History of bioprosthetic aortic valve replacement: Device appeared to be functioning on most recent echocardiogram.  Continue to monitor.   Disposition: F/u with Dr. Fletcher Anon or an APP in 3 months.  Current medicines are reviewed at length with the patient today.  The patient did not  have any concerns regarding medicines.  Signed, Christell Faith, PA-C 08/17/2018 3:00 PM     [facility] [address]  [place], [place] [address] [phone]

## 2018-08-15 NOTE — Transfer of Care (Signed)
Immediate Anesthesia Transfer of Care Note  Patient: [name]  Procedure(s) Performed: CARDIOVERSION (N/A )  Patient Location: PACU  Anesthesia Type:General  Level of Consciousness: sedated  Airway & Oxygen Therapy: Patient Spontanous Breathing and Patient connected to nasal cannula oxygen  Post-op Assessment: Report given to RN and Post -op Vital signs reviewed and stable  Post vital signs: Reviewed and stable  Last Vitals:  Vitals Value Taken Time  BP 131/76 08/15/2018  8:59 AM  Temp    Pulse 85 08/15/2018  8:59 AM  Resp 19 08/15/2018  8:59 AM  SpO2 93 % 08/15/2018  8:59 AM  Vitals shown include unvalidated device data.  Last Pain:  Vitals:   08/15/18 0805  TempSrc: Oral  PainSc: 0-No pain         Complications: No apparent anesthesia complications

## 2018-08-15 NOTE — Anesthesia Preprocedure Evaluation (Signed)
Anesthesia Evaluation  Patient identified by MRN, date of birth, ID band Patient awake    Reviewed: Allergy & Precautions, H&P , NPO status , Patient's Chart, lab work & pertinent test results  History of Anesthesia Complications Negative for: history of anesthetic complications  Airway Mallampati: III  TM Distance: <3 FB Neck ROM: limited    Dental  (+) Poor Dentition, Chipped, Missing   Pulmonary neg shortness of breath, COPD,  oxygen dependent, former smoker,    Pulmonary exam normal breath sounds clear to auscultation       Cardiovascular Exercise Tolerance: Poor hypertension, (-) angina+ DOE  (-) Past MI Normal cardiovascular exam+ dysrhythmias Atrial Fibrillation + Valvular Problems/Murmurs AS  Rhythm:regular Rate:Normal     Neuro/Psych negative neurological ROS  negative psych ROS   GI/Hepatic Neg liver ROS, GERD  Medicated and Controlled,  Endo/Other  Hypothyroidism   Renal/GU negative Renal ROS  negative genitourinary   Musculoskeletal   Abdominal   Peds  Hematology negative hematology ROS (+)   Anesthesia Other Findings Past Medical History: No date: Aortic stenosis due to bicuspid aortic valve     Comment: a. s/p bioprosthetic valve replacement 2008 at              [facility] 20 yrs ago: Basal skull fracture ([place]) No date: Chronic respiratory failure ([name]) No date: COPD (chronic obstructive pulmonary disease) (*     Comment: a. 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 on Eliquis; b. CHADS2VASc = 3 (HTN, age x               1, female) 2008: S/P ascending aortic aneurysm repair No date: Thyroid disease  Past Surgical History: 2008: ABDOMINAL AORTIC ANEURYSM  REPAIR No date: ABDOMINAL HYSTERECTOMY 2008: AORTIC VALVE REPLACEMENT No date: CARDIAC CATHETERIZATION     Comment: [facility] No date: CARPAL TUNNEL RELEASE No date: TUMOR EXCISION Left     Comment: x3 (arm)     Reproductive/Obstetrics negative OB ROS                             Anesthesia Physical  Anesthesia Plan  ASA: IV  Anesthesia Plan: General   Post-op Pain Management:    Induction:   PONV Risk Score and Plan:   Airway Management Planned: Nasal Cannula  Additional Equipment:   Intra-op Plan:   Post-operative Plan:   Informed Consent: I have reviewed the patients History and Physical, chart, labs and discussed the procedure including the risks, benefits and alternatives for the proposed anesthesia with the patient or authorized representative who has indicated his/her understanding and acceptance.   Dental Advisory Given  Plan Discussed with: Anesthesiologist, CRNA and Surgeon  Anesthesia Plan Comments:         Anesthesia Quick Evaluation

## 2018-08-15 NOTE — Telephone Encounter (Signed)
Ok thanks 

## 2018-08-15 NOTE — Anesthesia Procedure Notes (Signed)
Date/Time: 08/15/2018 8:53 AM Performed by: Hedda Slade, CRNA Pre-anesthesia Checklist: Patient identified, Emergency Drugs available, Suction available, Patient being monitored and Timeout performed Patient Re-evaluated:Patient Re-evaluated prior to induction Oxygen Delivery Method: Nasal cannula Induction Type: IV induction Dental Injury: Teeth and Oropharynx as per pre-operative assessment

## 2018-08-15 NOTE — Telephone Encounter (Signed)
Pt call r/t hypotension and "blood rushing to head" after cardioversion today. Lowest was 106/66.   Called patient, she reported that when she first got home after procedure that she was feeling weak and had "lower than normal" bps as follows; 106/66 114/67 108/63 129/77 3 pm  She reported that with time, bps improved as well as her sx.   I asked her to take her BP while she was on the phone with me. The value was 130/77, HR 67.  She reported that she got scared at the time but has no complaints at this time. I encouraged her to take it easy this afternoon given she had sedation today as well encouraged her to eat and drink.    She is aware that she has f/u this wed.    Advised pt to call for any further questions or concerns

## 2018-08-15 NOTE — Anesthesia Post-op Follow-up Note (Signed)
Anesthesia QCDR form completed.        

## 2018-08-16 ENCOUNTER — Encounter: Payer: Self-pay | Admitting: Cardiovascular Disease

## 2018-08-17 ENCOUNTER — Ambulatory Visit: Payer: Medicare Other | Admitting: Nurse Practitioner

## 2018-08-17 ENCOUNTER — Telehealth: Payer: Self-pay

## 2018-08-17 ENCOUNTER — Ambulatory Visit: Payer: Medicare Other | Admitting: Physician Assistant

## 2018-08-17 ENCOUNTER — Encounter: Payer: Self-pay | Admitting: Physician Assistant

## 2018-08-17 VITALS — BP 161/84 | HR 81 | Ht <= 58 in | Wt 185.5 lb

## 2018-08-17 DIAGNOSIS — I4892 Unspecified atrial flutter: Secondary | ICD-10-CM | POA: Diagnosis not present

## 2018-08-17 DIAGNOSIS — I2510 Atherosclerotic heart disease of native coronary artery without angina pectoris: Secondary | ICD-10-CM

## 2018-08-17 DIAGNOSIS — I10 Essential (primary) hypertension: Secondary | ICD-10-CM | POA: Diagnosis not present

## 2018-08-17 DIAGNOSIS — E782 Mixed hyperlipidemia: Secondary | ICD-10-CM

## 2018-08-17 DIAGNOSIS — Z953 Presence of xenogenic heart valve: Secondary | ICD-10-CM

## 2018-08-17 MED ORDER — DILTIAZEM HCL ER COATED BEADS 180 MG PO CP24: 180.0000 mg | ORAL_CAPSULE | Freq: Every day | ORAL | 3 refills | Status: DC

## 2018-08-17 NOTE — Telephone Encounter (Signed)
saw pt in clinic today with [name], all questions answered

## 2018-08-17 NOTE — Patient Instructions (Addendum)
Medication Instructions:  Your physician has recommended you make the following change in your medication:  1- Cardizem 1 capsule (180 mg) once daily.  If you need a refill on your cardiac medications before your next appointment, please call your pharmacy.   Lab work: None today  If you have labs (blood work) drawn today and your tests are completely normal, you will receive your results only by: [name] MyChart Message (if you have MyChart) OR . A paper copy in the mail If you have any lab test that is abnormal or we need to change your treatment, we will call you to review the results.  Testing/Procedures: None ordered  Follow-Up: At [facility], you and your health needs are our priority.  As part of our continuing mission to provide you with exceptional heart care, we have created designated Provider Care Teams.  These Care Teams include your primary Cardiologist (physician) and Advanced Practice Providers (APPs -  Physician Assistants and Nurse Practitioners) who all work together to provide you with the care you need, when you need it.  1- Dr. Fletcher Anon 3 mo 2- Dr [name], referral to EP for atrial flutter ablation

## 2018-08-19 DIAGNOSIS — M19012 Primary osteoarthritis, left shoulder: Secondary | ICD-10-CM | POA: Diagnosis not present

## 2018-08-29 ENCOUNTER — Ambulatory Visit: Payer: Medicare Other | Admitting: Nurse Practitioner

## 2018-09-08 ENCOUNTER — Other Ambulatory Visit: Payer: Self-pay | Admitting: Cardiovascular Disease

## 2018-09-08 NOTE — Telephone Encounter (Signed)
Lmovm to verify it pt is taking Hydralazine and how she currently is taking if she is taking medication.

## 2018-09-09 ENCOUNTER — Other Ambulatory Visit: Payer: Self-pay

## 2018-09-12 ENCOUNTER — Other Ambulatory Visit: Payer: Self-pay

## 2018-09-12 MED ORDER — POTASSIUM CHLORIDE CRYS ER 20 MEQ PO TBCR: 20.0000 meq | EXTENDED_RELEASE_TABLET | Freq: Two times a day (BID) | ORAL | 0 refills | Status: DC

## 2018-09-12 NOTE — Telephone Encounter (Signed)
Pt called that she taking potassium 20 meq 1 tab twice change by cardiologist as per send new pres to [facility] and also lmom that we send med

## 2018-09-16 ENCOUNTER — Telehealth: Payer: Self-pay

## 2018-09-16 NOTE — Telephone Encounter (Signed)
Please review refill on Potassium Cl 20 Meq tablets, look like Dr. Posey Pronto gave Rx.   The patient is requesting the refill through [place] by [place].

## 2018-09-19 MED ORDER — POTASSIUM CHLORIDE CRYS ER 20 MEQ PO TBCR: 20.0000 meq | EXTENDED_RELEASE_TABLET | Freq: Two times a day (BID) | ORAL | 3 refills | Status: DC

## 2018-09-19 NOTE — Telephone Encounter (Signed)
Pharmacy request for ongoing refills of K Cl. According to last OV note from [name], [place]. Pt to continue current medication as ordered.  Medication refill processed.

## 2018-09-22 DIAGNOSIS — G4733 Obstructive sleep apnea (adult) (pediatric): Secondary | ICD-10-CM | POA: Diagnosis not present

## 2018-09-28 ENCOUNTER — Ambulatory Visit: Payer: Medicare Other

## 2018-09-28 DIAGNOSIS — G4733 Obstructive sleep apnea (adult) (pediatric): Secondary | ICD-10-CM

## 2018-09-28 NOTE — Progress Notes (Signed)
95 percentile pressure 8   95th percentile leak 5.1   apnea index 0.5 /hr  apnea-hypopnea index  0.7 /hr   total days used  >4 hr 90 days  total days used <4 hr 0 days  Total compliance 100 percent  Ms.Copes is doing great no questions or problems.

## 2018-09-29 ENCOUNTER — Other Ambulatory Visit: Payer: Self-pay | Admitting: *Deleted

## 2018-09-29 ENCOUNTER — Telehealth: Payer: Self-pay | Admitting: Cardiovascular Disease

## 2018-09-29 MED ORDER — HYDRALAZINE HCL 25 MG PO TABS: 25.0000 mg | ORAL_TABLET | Freq: Two times a day (BID) | ORAL | 0 refills | Status: DC

## 2018-09-29 NOTE — Telephone Encounter (Signed)
Requested Prescriptions   Signed Prescriptions Disp Refills  . hydrALAZINE (APRESOLINE) 25 MG tablet 180 tablet 0    Sig: Take 1 tablet (25 mg total) by mouth 2 (two) times daily.    Authorizing Provider: [name] A    Ordering User: Britt Bottom

## 2018-09-29 NOTE — Telephone Encounter (Signed)
°*  STAT* If patient is at the pharmacy, call can be transferred to refill team.   1. Which medications need to be refilled? (please list name of each medication and dose if known)  Hydralazine 25 MG 1 tablet 2 times daily  2. Which pharmacy/location (including street and city if local pharmacy) is medication to be sent to? [facility]  3. Do they need a 30 day or 90 day supply? 90 day

## 2018-10-04 ENCOUNTER — Ambulatory Visit: Payer: Medicare Other | Admitting: Adult Health

## 2018-10-04 ENCOUNTER — Encounter: Payer: Self-pay | Admitting: Adult Health

## 2018-10-04 VITALS — BP 143/73 | HR 79 | Temp 98.4°F | Resp 16 | Ht 60.0 in | Wt 185.0 lb

## 2018-10-04 DIAGNOSIS — I10 Essential (primary) hypertension: Secondary | ICD-10-CM

## 2018-10-04 DIAGNOSIS — R6889 Other general symptoms and signs: Secondary | ICD-10-CM

## 2018-10-04 DIAGNOSIS — J0110 Acute frontal sinusitis, unspecified: Secondary | ICD-10-CM | POA: Diagnosis not present

## 2018-10-04 LAB — POCT INFLUENZA A/B
Influenza A, POC: NEGATIVE
Influenza B, POC: NEGATIVE

## 2018-10-04 MED ORDER — AMOXICILLIN-POT CLAVULANATE 875-125 MG PO TABS: 1.0000 | ORAL_TABLET | Freq: Two times a day (BID) | ORAL | 0 refills | Status: DC

## 2018-10-04 NOTE — Patient Instructions (Signed)

## 2018-10-04 NOTE — Progress Notes (Signed)
[facility] [place], [place] [address]  Internal MEDICINE  Office Visit Note  Patient Name: [name]  [id number]  [id number]  Date of Service: 10/16/2018  Chief Complaint  Patient presents with  . Sinusitis  . Cough  . Generalized Body Aches  . Chills     HPI Pt is here for a sick visit.  She reports 6 days of sinus pain pressure as well as postnasal drip.  She also reports cough generalized body aches and chills.  She is concerned that she may have the flu.  Flu test today was negative.     Current Medication:  Outpatient Encounter Medications as of 10/04/2018  Medication Sig Note  . acetaminophen (TYLENOL) 500 MG tablet Take 500 mg by mouth 2 (two) times daily as needed for moderate pain or headache.   . albuterol (PROVENTIL HFA;VENTOLIN HFA) 108 (90 Base) MCG/ACT inhaler Inhale 2 puffs into the lungs every 6 (six) hours as needed for wheezing or shortness of breath.   . ALPRAZolam (XANAX) 0.25 MG tablet Take 2 tablets (0.5 mg total) by mouth at bedtime as needed.   [name] amoxicillin (AMOXIL) 500 MG capsule Take 4 tablets po one time, one hour prior to dental procedure. 08/10/2018: Pt has not started yet. Dental procedure was originally scheduled for 08/10/2018  . atorvastatin (LIPITOR) 40 MG tablet 1 tab po qhs   . Coenzyme Q10 (COQ10) 200 MG CAPS Take 200 mg by mouth daily.   . diclofenac sodium (VOLTAREN) 1 % GEL To the affected joint   . diltiazem (CARDIZEM CD) 180 MG 24 hr capsule Take 1 capsule (180 mg total) by mouth daily.   . DULoxetine (CYMBALTA) 60 MG capsule Take 1 capsule (60 mg total) by mouth 2 (two) times daily. (Patient taking differently: Take 60 mg by mouth daily. )   . ELIQUIS 5 MG TABS tablet Take 1 tablet (5 mg total) by mouth 2 (two) times daily.   . ferrous sulfate (FERROUSUL) 325 (65 FE) MG tablet Take 325 mg by mouth daily with breakfast.   . flecainide (TAMBOCOR) 50 MG tablet Take 1 tablet (50 mg total) by mouth 2 (two)  times daily.   . hydrALAZINE (APRESOLINE) 25 MG tablet Take 1 tablet (25 mg total) by mouth 2 (two) times daily.   . Ipratropium-Albuterol (COMBIVENT) 20-100 MCG/ACT AERS respimat Inhale 1 puff into the lungs every 6 (six) hours. (Patient not taking: Reported on 10/11/2018)   . Ketotifen Fumarate (ALAWAY OP) Apply 1 drop to eye daily as needed (allergies).   [name] levothyroxine (SYNTHROID, LEVOTHROID) 25 MCG tablet Take 1 tablet (25 mcg total) by mouth daily before breakfast.   . lisinopril-hydrochlorothiazide (PRINZIDE,ZESTORETIC) 20-25 MG tablet Take 1 tablet by mouth daily.   [name] loratadine (CLARITIN) 10 MG tablet Take 10 mg by mouth daily.   . OXYGEN Inhale into the lungs.   . pantoprazole (PROTONIX) 40 MG tablet Take 1 tablet (40 mg total) by mouth daily.   . potassium chloride SA (K-DUR,KLOR-CON) 20 MEQ tablet Take 1 tablet (20 mEq total) by mouth 2 (two) times daily.   . sodium chloride (OCEAN) 0.65 % SOLN nasal spray Place 1 spray into both nostrils daily as needed for congestion.   . traZODone (DESYREL) 50 MG tablet Take 2 tablets (100 mg total) by mouth at bedtime. (Patient taking differently: Take 50 mg by mouth at bedtime. )   . amoxicillin-clavulanate (AUGMENTIN) 875-125 MG tablet Take 1 tablet by mouth 2 (two) times  daily.    No facility-administered encounter medications on file as of 10/04/2018.       Medical History: Past Medical History:  Diagnosis Date  . 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 s/p bioprosthetic valve replacement 2008 at [facility];  b. 01/2015 Echo: EF 60-65%, no rwma, Gr1 DD, mildly dil LA, nl RV fxn.  . Atrial fibrillation with RVR ([place]) 01/11/2015  . Atrial flutter ([place])    a. 08/2016 s/p DCCV.  Remains on flecainide 50 mg bid.  . Basal skull fracture ([place]) 20 yrs ago  . Chronic respiratory failure ([place])   . COPD (chronic obstructive pulmonary disease) ([place])    a. on home O2 at 2L since 2008  . 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 Essential hypertension 01/24/2015  . History of cardiac cath    a. 2008 prior to Aortic aneurysm repair-->nl cors.  . History of stress test    a. 10/2015 MV: no ischemia/infarct.  [name] HLD (hyperlipidemia)   . HTN (hypertension)   . Hypothyroidism   . Obesity   . PAF (paroxysmal atrial fibrillation) ([name])    a. on Eliquis; b. CHADS2VASc = 3 (HTN, age x 1, female).  . Paroxysmal atrial fibrillation ([place]) 01/24/2015  . Right upper quadrant abdominal tenderness without rebound tenderness 03/02/2018  . S/P ascending aortic aneurysm repair 2008     Vital Signs: BP (!) 143/73   Pulse 79   Temp 98.4 F (36.9 C)   Resp 16   Ht 5' (1.524 m)   Wt 185 lb (83.9 kg)   SpO2 97% Comment: 2litre  BMI 36.13 kg/m    Review of Systems  Constitutional: Positive for chills and fatigue. Negative for unexpected weight change.  HENT: Positive for postnasal drip, sinus pressure, sinus pain and sore throat. Negative for congestion, rhinorrhea and sneezing.   Eyes: Negative for photophobia, pain and redness.  Respiratory: Positive for cough. Negative for chest tightness and shortness of breath.   Cardiovascular: Negative for chest pain and palpitations.  Gastrointestinal: Negative for abdominal pain, constipation, diarrhea, nausea and vomiting.  Endocrine: Negative.   Genitourinary: Negative for dysuria and frequency.  Musculoskeletal: Negative for arthralgias, back pain, joint swelling and neck pain.  Skin: Negative for rash.  Allergic/Immunologic: Negative.   Neurological: Negative for tremors and numbness.  Hematological: Negative for adenopathy. Does not bruise/bleed easily.  Psychiatric/Behavioral: Negative for behavioral problems and sleep disturbance. The patient is not nervous/anxious.     Physical Exam Vitals signs and nursing note reviewed.  Constitutional:      General: She is not in acute distress.    Appearance: She is well-developed. She is not diaphoretic.  HENT:     Head:  Normocephalic and atraumatic.     Mouth/Throat:     Pharynx: No oropharyngeal exudate.  Eyes:     Pupils: Pupils are equal, round, and reactive to light.  Neck:     Musculoskeletal: Normal range of motion and neck supple.     Thyroid: No thyromegaly.     Vascular: No JVD.     Trachea: No tracheal deviation.  Cardiovascular:     Rate and Rhythm: Normal rate and regular rhythm.     Heart sounds: Normal heart sounds. No murmur. No friction rub. No gallop.   Pulmonary:     Effort: Pulmonary effort is normal. No respiratory distress.     Breath sounds: Normal breath sounds. No wheezing  or rales.  Chest:     Chest wall: No tenderness.  Abdominal:     Palpations: Abdomen is soft.     Tenderness: There is no abdominal tenderness. There is no guarding.  Musculoskeletal: Normal range of motion.  Lymphadenopathy:     Cervical: No cervical adenopathy.  Skin:    General: Skin is warm and dry.  Neurological:     Mental Status: She is alert and oriented to person, place, and time.     Cranial Nerves: No cranial nerve deficit.  Psychiatric:        Behavior: Behavior normal.        Thought Content: Thought content normal.        Judgment: Judgment normal.    Assessment/Plan: 1. Acute non-recurrent frontal sinusitis Patient's severity and length of symptoms a course of Augmentin is prescribed.  Patient is instructed to take all medications until completion.  Drink plenty of fluids and rest.  Return to clinic in 7 to 10 days if symptoms fail to improve. - amoxicillin-clavulanate (AUGMENTIN) 875-125 MG tablet; Take 1 tablet by mouth 2 (two) times daily.  Dispense: 14 tablet; Refill: 0  2. Flu-like symptoms Influenza test negative for a and B today. - POCT Influenza A/B  3. Essential hypertension Blood pressure slightly elevated at today's visit.  Likely due to illness we will continue to monitor.  General Counseling: Adelena verbalizes understanding of the findings of todays visit and agrees  with plan of treatment. I have discussed any further diagnostic evaluation that may be needed or ordered today. We also reviewed her medications today. she has been encouraged to call the office with any questions or concerns that should arise related to todays visit.   Orders Placed This Encounter  Procedures  . POCT Influenza A/B    Meds ordered this encounter  Medications  . amoxicillin-clavulanate (AUGMENTIN) 875-125 MG tablet    Sig: Take 1 tablet by mouth 2 (two) times daily.    Dispense:  14 tablet    Refill:  0    Time spent: 25 Minutes  This patient was seen by Orson Gear AGNP-C in Collaboration with Dr [name] as a part of collaborative care agreement.  Kendell Bane AGNP-C Internal Medicine

## 2018-10-06 ENCOUNTER — Ambulatory Visit: Payer: Self-pay | Admitting: Nurse Practitioner

## 2018-10-06 ENCOUNTER — Ambulatory Visit: Payer: Medicare Other | Admitting: Internal Medicine

## 2018-10-10 ENCOUNTER — Ambulatory Visit: Payer: Self-pay | Admitting: Internal Medicine

## 2018-10-11 ENCOUNTER — Other Ambulatory Visit: Payer: Self-pay

## 2018-10-11 DIAGNOSIS — H2513 Age-related nuclear cataract, bilateral: Secondary | ICD-10-CM | POA: Diagnosis not present

## 2018-10-11 NOTE — Telephone Encounter (Signed)
lmom to call us back

## 2018-10-11 NOTE — Telephone Encounter (Signed)
Spoke with pt that stopped combivent and start Stiolto Respimat 2.74mcg/2.5Mcg use as directed once daily  And use ventolin as needed and also gave her three sample

## 2018-10-24 ENCOUNTER — Ambulatory Visit: Payer: Self-pay | Admitting: Nurse Practitioner

## 2018-10-24 ENCOUNTER — Other Ambulatory Visit: Payer: Self-pay

## 2018-10-24 DIAGNOSIS — F5101 Primary insomnia: Secondary | ICD-10-CM

## 2018-10-24 MED ORDER — DULOXETINE HCL 60 MG PO CPEP: 60.0000 mg | ORAL_CAPSULE | Freq: Two times a day (BID) | ORAL | 1 refills | Status: DC

## 2018-10-24 MED ORDER — TRAZODONE HCL 50 MG PO TABS: 50.0000 mg | ORAL_TABLET | Freq: Every day | ORAL | 1 refills | Status: DC

## 2018-10-27 ENCOUNTER — Other Ambulatory Visit: Payer: Self-pay

## 2018-10-27 MED ORDER — DULOXETINE HCL 60 MG PO CPEP: 60.0000 mg | ORAL_CAPSULE | Freq: Every day | ORAL | 1 refills | Status: DC

## 2018-10-31 ENCOUNTER — Encounter: Payer: Self-pay | Admitting: Emergency Medicine

## 2018-10-31 ENCOUNTER — Emergency Department
Admission: EM | Admit: 2018-10-31 | Discharge: 2018-10-31 | Disposition: A | Discharge status: Home / Self Care | Payer: Medicare Other | Attending: Emergency Medicine | Admitting: Emergency Medicine

## 2018-10-31 ENCOUNTER — Emergency Department: Payer: Medicare Other

## 2018-10-31 ENCOUNTER — Other Ambulatory Visit: Payer: Self-pay

## 2018-10-31 ENCOUNTER — Telehealth: Payer: Self-pay | Admitting: Cardiovascular Disease

## 2018-10-31 DIAGNOSIS — Z87891 Personal history of nicotine dependence: Secondary | ICD-10-CM | POA: Insufficient documentation

## 2018-10-31 DIAGNOSIS — I10 Essential (primary) hypertension: Secondary | ICD-10-CM | POA: Insufficient documentation

## 2018-10-31 DIAGNOSIS — R000 Tachycardia, unspecified: Secondary | ICD-10-CM | POA: Diagnosis not present

## 2018-10-31 DIAGNOSIS — Z952 Presence of prosthetic heart valve: Secondary | ICD-10-CM | POA: Insufficient documentation

## 2018-10-31 DIAGNOSIS — I480 Paroxysmal atrial fibrillation: Secondary | ICD-10-CM | POA: Insufficient documentation

## 2018-10-31 DIAGNOSIS — R079 Chest pain, unspecified: Secondary | ICD-10-CM | POA: Diagnosis not present

## 2018-10-31 DIAGNOSIS — J449 Chronic obstructive pulmonary disease, unspecified: Secondary | ICD-10-CM | POA: Diagnosis not present

## 2018-10-31 DIAGNOSIS — R42 Dizziness and giddiness: Secondary | ICD-10-CM | POA: Diagnosis not present

## 2018-10-31 DIAGNOSIS — R002 Palpitations: Secondary | ICD-10-CM

## 2018-10-31 DIAGNOSIS — E039 Hypothyroidism, unspecified: Secondary | ICD-10-CM | POA: Diagnosis not present

## 2018-10-31 LAB — BASIC METABOLIC PANEL
Anion gap: 8 (ref 5–15)
BUN: 10 mg/dL (ref 8–23)
CO2: 26 mmol/L (ref 22–32)
Calcium: 9 mg/dL (ref 8.9–10.3)
Chloride: 100 mmol/L (ref 98–111)
Creatinine, Ser: 0.61 mg/dL (ref 0.44–1.00)
GFR calc Af Amer: >60 mL/min (ref >60)
GFR calc non Af Amer: >60 mL/min (ref >60)
Glucose, Bld: 107 mg/dL — ABNORMAL HIGH (ref 70–99)
Potassium: 3.5 mmol/L (ref 3.5–5.1)
Sodium: 134 mmol/L — ABNORMAL LOW (ref 135–145)

## 2018-10-31 LAB — CBC
HCT: 38.2 % (ref 36.0–46.0)
Hemoglobin: 12.2 g/dL (ref 12.0–15.0)
MCH: 27.3 pg (ref 26.0–34.0)
MCHC: 31.9 g/dL (ref 30.0–36.0)
MCV: 85.5 fL (ref 80.0–100.0)
Platelets: 319 10*3/uL (ref 150–400)
RBC: 4.47 MIL/uL (ref 3.87–5.11)
RDW: 16.5 % — ABNORMAL HIGH (ref 11.5–15.5)
WBC: 5.5 10*3/uL (ref 4.0–10.5)
nRBC: 0 % (ref 0.0–0.2)

## 2018-10-31 LAB — TSH: TSH: 1.755 u[IU]/mL (ref 0.350–4.500)

## 2018-10-31 LAB — TROPONIN I
Troponin I: <0.03 ng/mL (ref <0.03)
Troponin I: <0.03 ng/mL (ref <0.03)

## 2018-10-31 LAB — MAGNESIUM: Magnesium: 2 mg/dL (ref 1.7–2.4)

## 2018-10-31 MED ORDER — DILTIAZEM HCL 25 MG/5ML IV SOLN
15.0000 mg | Freq: Once | INTRAVENOUS | Status: AC
  Administered 2018-10-31: 15 mg via INTRAVENOUS
  Filled 2018-10-31: qty 5

## 2018-10-31 MED ORDER — SODIUM CHLORIDE 0.9 % IV BOLUS
1000.0000 mL | Freq: Once | INTRAVENOUS | Status: AC
  Administered 2018-10-31: 1000 mL via INTRAVENOUS

## 2018-10-31 MED ORDER — METOPROLOL TARTRATE 5 MG/5ML IV SOLN
INTRAVENOUS | Status: AC
  Filled 2018-10-31: qty 5

## 2018-10-31 MED ORDER — LABETALOL HCL 5 MG/ML IV SOLN: 10.0000 mg | Freq: Once | INTRAVENOUS | Status: DC

## 2018-10-31 MED ORDER — FLECAINIDE ACETATE 50 MG PO TABS: 100.0000 mg | ORAL_TABLET | Freq: Two times a day (BID) | ORAL | 0 refills | Status: DC

## 2018-10-31 MED ORDER — METOPROLOL TARTRATE 5 MG/5ML IV SOLN
10.0000 mg | Freq: Once | INTRAVENOUS | Status: AC
  Administered 2018-10-31: 10 mg via INTRAVENOUS
  Filled 2018-10-31: qty 10

## 2018-10-31 NOTE — ED Triage Notes (Signed)
Pt on continuous oxygen at 2.5l

## 2018-10-31 NOTE — Telephone Encounter (Signed)
Agree. Thanks

## 2018-10-31 NOTE — Telephone Encounter (Signed)
Patient states that she is having another afib attack. She reports heart rate of 120 with increased shortness of breath, chest pain, and just overall not feeling well. She states that she woke up not feeling right and knew when she saw her heart rate that this was another attack and she wanted to know what she should do. Advised she should proceed to the ED for further evaluation given her chest pain, shortness of breath, and elevated heart rates. She verbalized understanding, agreement with plan, and had no further questions at this time.

## 2018-10-31 NOTE — Discharge Instructions (Signed)
Please increase your flecainide from 50 mg twice daily to 100 mg twice daily.  Please make an appointment for follow-up with Dr. Fletcher Anon.  Return to the emergency department if you develop chest pain, shortness of breath, lightheadedness or fainting, or any other symptoms concerning to you.

## 2018-10-31 NOTE — ED Triage Notes (Signed)
Pt repots this am when she got up about 9:30 she felt like her heart was beating irregularly. Pt states that she has went in A-fib in the past and it feels the same.

## 2018-10-31 NOTE — ED Provider Notes (Signed)
[facility] Emergency Department Provider Note  ____________________________________________  Time seen: Approximately 12:58 PM  I have reviewed the triage vital signs and the nursing notes.   HISTORY  Chief Complaint Irregular Heart Beat    HPI [name] is a 73 y.o. female the history of paroxysmal aflutter and A. fib status post cardioversion x2 most recently 12/19, on Eliquis, COPD on 2.5 L nasal cannula, presenting with palpitations.  The patient reports that around noon today, she developed symptoms similar to her prior atrial fibrillation with a fast heart rate, heart racing sensation.  She had some associated lightheadedness but denies any chest pain, shortness of breath, diaphoresis, nausea or vomiting.  She has not had any recent changes in her medications.  She is currently being evaluated by Dr. Fletcher Anon for ablation.  Past Medical History:  Diagnosis Date  . 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 s/p bioprosthetic valve replacement 2008 at [facility];  b. 01/2015 Echo: EF 60-65%, no rwma, Gr1 DD, mildly dil LA, nl RV fxn.  . Atrial fibrillation with RVR ([place]) 01/11/2015  . Atrial flutter ([place])    a. 08/2016 s/p DCCV.  Remains on flecainide 50 mg bid.  . Basal skull fracture ([place]) 20 yrs ago  . Chronic respiratory failure ([place])   . COPD (chronic obstructive pulmonary disease) ([place])    a. on home O2 at 2L since 2008  . 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 Essential hypertension 01/24/2015  . History of cardiac cath    a. 2008 prior to Aortic aneurysm repair-->nl cors.  . History of stress test    a. 10/2015 MV: no ischemia/infarct.  [name] HLD (hyperlipidemia)   . HTN (hypertension)   . Hypothyroidism   . Obesity   . PAF (paroxysmal atrial fibrillation) ([name])    a. on Eliquis; b. CHADS2VASc = 3 (HTN, age x 1, female).  . Paroxysmal atrial fibrillation ([place]) 01/24/2015  . Right upper quadrant abdominal tenderness without  rebound tenderness 03/02/2018  . S/P ascending aortic aneurysm repair 2008    Patient Active Problem List   Diagnosis Date Noted  . Paroxysmal atrial flutter ([place]) 08/11/2018  . Encounter for general adult medical examination with abnormal findings 07/09/2018  . Chronic left shoulder pain 07/09/2018  . Conjunctivitis 07/09/2018  . Oxygen dependent 07/09/2018  . GAD (generalized anxiety disorder) 07/09/2018  . Ovarian failure 07/09/2018  . Positive colorectal cancer screening using Cologuard test 04/24/2018  . Calculus of gallbladder with acute on chronic cholecystitis 04/08/2018  . H/O: CVA (cerebrovascular accident) 04/08/2018  . Dysuria 03/02/2018  . Urinary tract infection without hematuria 03/02/2018  . Right upper quadrant abdominal tenderness without rebound tenderness 03/02/2018  . 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 exacerbation ([place]) 03/02/2018  . COPD (chronic obstructive pulmonary disease) ([place]) 09/19/2017  . Typical atrial flutter ([place])   . Irritable bowel syndrome without diarrhea 01/29/2015  . Essential hypertension 01/24/2015  . Paroxysmal atrial fibrillation ([place]) 01/24/2015  . History of aortic valve replacement with bioprosthetic valve 01/24/2015  . Atrial fibrillation with RVR ([place]) 01/11/2015  . 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 Impingement syndrome of shoulder 05/18/2014  . Cellulitis and abscess 02/13/2014  . MRSA (methicillin resistant Staphylococcus aureus) 02/13/2014  . Aneurysm, ascending aorta ([place]) 06/23/2013  . Bicuspid aortic valve 06/23/2013    Past Surgical History:  Procedure Laterality Date  . ABDOMINAL AORTIC ANEURYSM REPAIR  2008  .  ABDOMINAL HYSTERECTOMY    . AORTIC VALVE REPLACEMENT  2008  . CARDIAC CATHETERIZATION     [facility]  . CARDIOVERSION N/A 08/15/2018   Procedure: CARDIOVERSION;  Surgeon: [name], MD;  Location: [facility] ORS;  Service: Cardiovascular;  Laterality: N/A;  . CARPAL TUNNEL  RELEASE    . ELECTROPHYSIOLOGIC STUDY N/A 08/17/2016   Procedure: Cardioversion;  Surgeon: [name], MD;  Location: [facility] ORS;  Service: Cardiovascular;  Laterality: N/A;  . TUMOR EXCISION Left    x3 (arm)    Current Outpatient Rx  . Order #: [id number] Class: Historical Med  . Order #: [id number] Class: Normal  . Order #: [id number] Class: Normal  . Order #: [id number] Class: Normal  . Order #: [id number] Class: Normal  . Order #: [id number] Class: Normal  . Order #: [id number] Class: Historical Med  . Order #: [id number] Class: Normal  . Order #: [id number] Class: No Print  . Order #: [id number] Class: Phone In  . Order #: [id number] Class: Normal  . Order #: [id number] Class: Historical Med  . Order #: [id number] Class: Print  . Order #: [id number] Class: Normal  . Order #: [id number] Class: Normal  . Order #: [id number] Class: Historical Med  . Order #: [id number] Class: Normal  . Order #: [id number] Class: Normal  . Order #: [id number] Class: Historical Med  . Order #: [id number] Class: Historical Med  . Order #: [id number] Class: Normal  . Order #: [id number] Class: Normal  . Order #: [id number] Class: Historical Med  . Order #: [id number] Class: Historical Med  . Order #: [id number] Class: Normal    Allergies Benadryl [diphenhydramine hcl (sleep)]; Cetirizine; Diphenhydramine-zinc acetate; Lasix [furosemide]; Levaquin [levofloxacin in d5w]; Levofloxacin; Meloxicam; Soy allergy; and Sulfa antibiotics  Family History  Problem Relation Age of Onset  . Stroke Father   . Stroke Paternal Grandmother     Social History Social History   Tobacco Use  . Smoking status: Former Research scientist (life sciences)  . Smokeless tobacco: Never Used  Substance Use Topics  . Alcohol use: No  . Drug use: No    Review of Systems Constitutional: No fever/chills.  As of lightheadedness without syncope. Eyes: No visual changes. ENT: No sore throat. No congestion or rhinorrhea. Cardiovascular: Denies chest pain.  Positive  palpitations. Respiratory: Denies shortness of breath.  No cough. Gastrointestinal: Unchanged right upper quadrant abdominal pain from cholelithiasis; no acute changes; under evaluation for cholecyst[facility] by Dr[facility] Dossie [facility]  No nausea, no vomiting.  No diarrhea.  No constipation. Genitourinary: Negative for dysuria. Musculoskeletal: Negative for back pain.  No lower extremity swelling or calf pain. Skin: Negative for rash. Neurological: Negative for headaches. No focal numbness, tingling or weakness.     ____________________________________________   PHYSICAL EXAM:  VITAL SIGNS: ED Triage Vitals  Enc Vitals Group     BP 10/31/18 1102 (!) 141/84     Pulse Rate 10/31/18 1102 (!) 121     Resp 10/31/18 1102 (!) 21     Temp 10/31/18 1102 97.8 F (36.6 C)     Temp Source 10/31/18 1102 Oral     SpO2 10/31/18 1102 95 %     Weight 10/31/18 1100 180 lb (81.6 kg)     Height 10/31/18 1100 4\' 10"  (1.473 m)     Head Circumference --      Peak Flow --      Pain Score 10/31/18 1059 0     Pain Loc --      Pain Edu? --      Excl. in [place]? --     Constitutional: Alert and oriented.  Answers questions  appropriately.  Chronically ill-appearing. Eyes: Conjunctivae are normal.  EOMI. No scleral icterus. Head: Atraumatic. Nose: No congestion/rhinnorhea. Mouth/Throat: Mucous membranes are moist.  Neck: No stridor.  Supple.  JVD.  No meningismus. Cardiovascular: Fast rate, irregular rhythm. No murmurs, rubs or gallops.  Respiratory: Patient is satting in the high 90s on 2.5 L nasal cannula.  She is able to speak in full sentences without distress.  She is mildly tachypneic with a prolonged expiratory phase as well as some minimal expiratory wheezing, which is likely chronic.  No rales or rhonchi. Gastrointestinal: Obese.  Soft, nontender and nondistended.  No guarding or rebound.  No peritoneal signs.  Sign. Musculoskeletal: No LE edema. No ttp in the calves or palpable cords.  Negative Homan's  sign. Neurologic:  A&Ox3.  Speech is clear.  Face and smile are symmetric.  EOMI.  Moves all extremities well. Skin:  Skin is warm, dry and intact. No rash noted. Psychiatric: Anxious mood and affect.  ____________________________________________   LABS (all labs ordered are listed, but only abnormal results are displayed)  Labs Reviewed  BASIC METABOLIC PANEL - Abnormal; Notable for the following components:      Result Value   Sodium 134 (*)    Glucose, Bld 107 (*)    All other components within normal limits  CBC - Abnormal; Notable for the following components:   RDW 16.5 (*)    All other components within normal limits  TROPONIN I  TSH  MAGNESIUM  TROPONIN I   ____________________________________________  EKG  ED ECG REPORT I, Anne-Caroline Mariea Clonts, the attending physician, personally viewed and interpreted this ECG.   Date: 10/31/2018  EKG Time: 1102  Rate: 115  Rhythm: sinus tachycardia with frequent PVC's   Axis: leftward  Intervals:QRS borderline wide  ST&T Change: No STEMI  This EKG is compared to 08/17/2018 which did not show tachycardia but did show with a borderline QRS widening and left ventricular hypertrophy with leftward axis.  Overall morphology is unchanged.  ED ECG REPORT I, Anne-Caroline Mariea Clonts, the attending physician, personally viewed and interpreted this ECG.   Date: 10/31/2018  EKG Time: 1309  Rate: 115  Rhythm: sinus tachycardia  Axis: leftward  Intervals:none  ST&T Change: No STEMI  ED ECG REPORT I, Anne-Caroline Mariea Clonts, the attending physician, personally viewed and interpreted this ECG.   Date: 10/31/2018  EKG Time: 1328  Rate: 110  Rhythm: sinus tachycardia  Axis: leftward  Intervals:first-degree A-V block   ST&T Change: No STEMI  ED ECG REPORT I, Anne-Caroline Mariea Clonts, the attending physician, personally viewed and interpreted this ECG.   Date: 10/31/2018  EKG Time: 1501  Rate: 76  Rhythm: atrial fibrillation  Axis:  leftward  Intervals:none  ST&T Change: No STEMI     ____________________________________________  RADIOLOGY  Dg Chest 2 View  Result Date: 10/31/2018 CLINICAL DATA:  Chest pain and irregular heart rate. History of atrial fibrillation. EXAM: CHEST - 2 VIEW COMPARISON:  Chest x-ray dated August 10, 2018. FINDINGS: Stable mild cardiomegaly and pulmonary vascular congestion. Prior aortic valve replacement. Unchanged scarring at the lung bases. No focal consolidation, pleural effusion, or pneumothorax. No acute osseous abnormality. IMPRESSION: Stable cardiomegaly and mild pulmonary vascular congestion. Electronically Signed   By: Titus Dubin M.D.   On: 10/31/2018 11:35    ____________________________________________   PROCEDURES  Procedure(s) performed: None  Procedures  Critical Care performed: No ____________________________________________   INITIAL IMPRESSION / ASSESSMENT AND PLAN / ED COURSE  Pertinent labs & imaging results that were available  during my care of the patient were reviewed by me and considered in my medical decision making (see chart for details).  73 y.o. female with a history of A. fib status post cardioversion presenting with palpitations and lightheadedness.  Overall, the patient is tachycardic and we will slow her down with metoprolol and get a repeat EKG.  Her work-up in the emergency department has been reassuring.  Her troponin is negative and a second troponin is pending.  Her electrolytes are reassuring.  Her chest x-ray shows some pulmonary vascular congestion which is mild.  Plan reevaluation for final disposition.  ----------------------------------------- 2:43 PM on 10/31/2018 -----------------------------------------  The patient is feeling better at this time.  She has received metoprolol twice, and her heart rate continues to be mildly tachycardic although improved.  Her hypertension has improved.  Her work-up here has been reassuring.  Her  second troponin is pending.  I will speak with Dr. Fletcher Anon anticipation for discharge.  ----------------------------------------- 3:20 PM on 10/31/2018 -----------------------------------------  I have spoken with Dr. Fletcher Anon, who has reviewed the patient's EKGs.  His recommendation is to increase the patient's flecainide to 100 mg twice daily from 50 mg twice daily.  He also feels that the patient is safe for discharge home.  Her second troponin has come back negative.  At this time, the patient's heart rate is in the 70s, and she is asymptomatic.  Plan discharge. ____________________________________________  FINAL CLINICAL IMPRESSION(S) / ED DIAGNOSES  Final diagnoses:  Paroxysmal atrial fibrillation ([place])  Sinus tachycardia  Palpitations         NEW MEDICATIONS STARTED DURING THIS VISIT:  Current Discharge Medication List        Eula Listen, MD 10/31/18 1523

## 2018-10-31 NOTE — Telephone Encounter (Signed)
Patient c/o Palpitations:  High priority if patient c/o lightheadedness, shortness of breath, or chest pain  1) How long have you had palpitations/irregular HR/ Afib? Are you having the symptoms now? Yes, SOB, HR 120, some CP  2) Are you currently experiencing lightheadedness, SOB or CP?  yes  3) Do you have a history of afib (atrial fibrillation) or irregular heart rhythm? yes  4) Have you checked your BP or HR? (document readings if available): HR 120  5) Are you experiencing any other symptoms? SOB, lightheadedness, CP

## 2018-11-01 ENCOUNTER — Encounter: Payer: Self-pay | Admitting: Cardiovascular Disease

## 2018-11-01 ENCOUNTER — Ambulatory Visit: Payer: Medicare Other | Admitting: Cardiovascular Disease

## 2018-11-01 VITALS — BP 138/80 | HR 116 | Ht <= 58 in | Wt 183.0 lb

## 2018-11-01 DIAGNOSIS — I10 Essential (primary) hypertension: Secondary | ICD-10-CM

## 2018-11-01 DIAGNOSIS — I359 Nonrheumatic aortic valve disorder, unspecified: Secondary | ICD-10-CM | POA: Diagnosis not present

## 2018-11-01 DIAGNOSIS — I484 Atypical atrial flutter: Secondary | ICD-10-CM

## 2018-11-01 NOTE — H&P (View-Only) (Signed)
Cardiology Office Note   Date:  11/01/2018   ID:  [name], DOB [birth year], MRN [id number]  PCP:  [name], MD  Cardiologist:   [name], MD   Chief Complaint  Patient presents with  . Other    hospital follow up. Patient c/o Elevated HR and SOB. Patient states she has been taking Diltazem 120MG  and she states she has already taken 6 pills of Flecainde. unsure what she is really taken. Meds reviewed verbally with patient.       History of Present Illness: [name] is a 73 y.o. female who presents for A follow-up visit regarding persistent atrial fibrillation/flutter and valvular heart disease. 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  Cardiac catheterization before valve replacement showed no obstructive coronary artery disease. Nuclear stress test in December 2018 showed no evidence of ischemia with normal ejection fraction. Echocardiogram in August 2019 showed normal LV systolic function with mild LVH, bioprosthetic aortic valve with normal function and no evidence of pulmonary hypertension. She had atrial flutter with tachycardia in December 2019.  She underwent successful cardioversion.  Her flecainide was initially increased to 100 mg twice daily but subsequently decreased to 50 mg twice daily.  She again had recent tachycardia and went to the emergency room yesterday and was found to be in atrial flutter with RVR.  We instructed her to increase flecainide back to 100 mg twice daily and diltiazem extended release to 180 mg once daily. She continues to be tachycardic but overall she feels better.  No chest pain or worsening dyspnea.  Past Medical History:  Diagnosis Date  . Aortic stenosis due to bicuspid aortic  valve    a. s/p bioprosthetic valve replacement 2008 at [facility];  b. 01/2015 Echo: EF 60-65%, no rwma, Gr1 DD, mildly dil LA, nl RV fxn.  . Atrial fibrillation with RVR ([place]) 01/11/2015  . Atrial flutter ([place])    a. 08/2016 s/p DCCV.  Remains on flecainide 50 mg bid.  . Basal skull fracture ([place]) 20 yrs ago  . Chronic respiratory failure ([place])   . COPD (chronic obstructive pulmonary disease) ([place])    a. on home O2 at 2L since 2008  . 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 Essential hypertension 01/24/2015  . History of cardiac cath    a. 2008 prior to Aortic aneurysm repair-->nl cors.  . History of stress test    a. 10/2015 MV: no ischemia/infarct.  [name] HLD (hyperlipidemia)   . HTN (hypertension)   . Hypothyroidism   . Obesity   . PAF (paroxysmal atrial fibrillation) ([name])    a. on Eliquis; b. CHADS2VASc = 3 (HTN, age x 1, female).  . Paroxysmal atrial fibrillation ([place]) 01/24/2015  . Right upper quadrant abdominal tenderness without rebound tenderness 03/02/2018  . S/P ascending aortic aneurysm repair 2008    Past Surgical History:  Procedure Laterality Date  . ABDOMINAL AORTIC ANEURYSM REPAIR  2008  . ABDOMINAL HYSTERECTOMY    . AORTIC VALVE REPLACEMENT  2008  . CARDIAC CATHETERIZATION     [facility]  . CARDIOVERSION N/A 08/15/2018   Procedure: CARDIOVERSION;  Surgeon: [name], MD;  Location: [facility] ORS;  Service:  Cardiovascular;  Laterality: N/A;  . CARPAL TUNNEL RELEASE    . ELECTROPHYSIOLOGIC STUDY N/A 08/17/2016   Procedure: Cardioversion;  Surgeon: [name], MD;  Location: [facility] ORS;  Service: Cardiovascular;  Laterality: N/A;  . TUMOR EXCISION Left    x3 (arm)     Current Outpatient Medications  Medication Sig Dispense Refill  . acetaminophen (TYLENOL) 500 MG tablet Take 500 mg by mouth 2 (two) times daily as needed for moderate pain or headache.    . albuterol (PROVENTIL HFA;VENTOLIN HFA) 108 (90 Base) MCG/ACT inhaler Inhale 2 puffs into the  lungs every 6 (six) hours as needed for wheezing or shortness of breath. 3 Inhaler 3  . ALPRAZolam (XANAX) 0.25 MG tablet Take 2 tablets (0.5 mg total) by mouth at bedtime as needed. 60 tablet 2  . amoxicillin (AMOXIL) 500 MG capsule Take 4 tablets po one time, one hour prior to dental procedure. 30 capsule 0  . atorvastatin (LIPITOR) 40 MG tablet 1 tab po qhs 30 tablet 4  . Coenzyme Q10 (COQ10) 200 MG CAPS Take 200 mg by mouth daily.    . diclofenac sodium (VOLTAREN) 1 % GEL To the affected joint 100 Tube 3  . diltiazem (CARDIZEM CD) 180 MG 24 hr capsule Take 1 capsule (180 mg total) by mouth daily. 30 capsule 3  . DULoxetine (CYMBALTA) 60 MG capsule Take 1 capsule (60 mg total) by mouth daily. 90 capsule 1  . ELIQUIS 5 MG TABS tablet Take 1 tablet (5 mg total) by mouth 2 (two) times daily. 60 tablet 5  . ferrous sulfate (FERROUSUL) 325 (65 FE) MG tablet Take 325 mg by mouth daily with breakfast.    . flecainide (TAMBOCOR) 50 MG tablet Take 2 tablets (100 mg total) by mouth 2 (two) times daily. 120 tablet 0  . hydrALAZINE (APRESOLINE) 25 MG tablet Take 1 tablet (25 mg total) by mouth 2 (two) times daily. 180 tablet 0  . Ketotifen Fumarate (ALAWAY OP) Apply 1 drop to eye daily as needed (allergies).    [name] levothyroxine (SYNTHROID, LEVOTHROID) 25 MCG tablet Take 1 tablet (25 mcg total) by mouth daily before breakfast. 90 tablet 1  . lisinopril-hydrochlorothiazide (PRINZIDE,ZESTORETIC) 20-25 MG tablet Take 1 tablet by mouth daily. 90 tablet 3  . loratadine (CLARITIN) 10 MG tablet Take 10 mg by mouth daily.    . OXYGEN Inhale into the lungs.    . pantoprazole (PROTONIX) 40 MG tablet Take 1 tablet (40 mg total) by mouth daily. 90 tablet 1  . potassium chloride SA (K-DUR,KLOR-CON) 20 MEQ tablet Take 1 tablet (20 mEq total) by mouth 2 (two) times daily. 180 tablet 3  . sodium chloride (OCEAN) 0.65 % SOLN nasal spray Place 1 spray into both nostrils daily as needed for congestion.    . traZODone  (DESYREL) 50 MG tablet Take 1 tablet (50 mg total) by mouth at bedtime. 90 tablet 1   No current facility-administered medications for this visit.     Allergies:   Benadryl [diphenhydramine hcl (sleep)]; Cetirizine; Diphenhydramine-zinc acetate; Lasix [furosemide]; Levaquin [levofloxacin in d5w]; Levofloxacin; Meloxicam; Soy allergy; and Sulfa antibiotics    Social History:  The patient  reports that she has quit smoking. She has never used smokeless tobacco. She reports that she does not drink alcohol or use drugs.   Family History:  The patient's family history includes Stroke in her father and paternal grandmother.    ROS:  Please see the history of present illness.   Otherwise, review  of systems are positive for none.   All other systems are reviewed and negative.    PHYSICAL EXAM: VS:  BP 138/80 (BP Location: Right Arm, Patient Position: Sitting, Cuff Size: Normal)   Pulse (!) 116   Ht 4\' 10"  (1.473 m)   Wt 183 lb (83 kg)   BMI 38.25 kg/m  , BMI Body mass index is 38.25 kg/m. GEN: Well nourished, well developed, in no acute distress  HEENT: normal  Neck: no JVD, carotid bruits, or masses Cardiac: Regular but tachycardic; no  rubs, or gallops,no edema . There  is a 2/6 systolic ejection murmur in the aortic area.  Respiratory:  clear to auscultation bilaterally, normal work of breathing GI: soft, nontender, nondistended, + BS MS: no deformity or atrophy  Skin: warm and dry, no rash Neuro:  Strength and sensation are intact Psych: euthymic mood, full affect   EKG:  EKG is ordered today. EKG showed atypical atrial flutter with ventricular rate of 116 bpm.  LVH with repolarization abnormalities and left anterior fascicular block.  Recent Labs: 10/31/2018: BUN 10; Creatinine, Ser 0.61; Hemoglobin 12.2; Magnesium 2.0; Platelets 319; Potassium 3.5; Sodium 134; TSH 1.755    Lipid Panel No results found for: CHOL, TRIG, HDL, CHOLHDL, VLDL, LDLCALC, LDLDIRECT    Wt Readings  from Last 3 Encounters:  11/01/18 183 lb (83 kg)  10/31/18 180 lb (81.6 kg)  10/04/18 185 lb (83.9 kg)         ASSESSMENT AND PLAN:  1.  Paroxysmal Atrial flutter: The patient has known history of paroxysmal atrial fibrillation documented on EKGs in 2016.  However, most recent rhythm problems seem to be related to atypical atrial flutter.  I agree with increasing flecainide to 100 mg twice daily and diltiazem extended release 180 mg once daily.  Continue anticoagulation with Eliquis.  I asked her to call our office in 1 week if her heart rate continues to be above 100.  That typically indicates that she is still in atrial flutter and in that situation she will need repeat cardioversion.   She has an appointment scheduled with Dr. [name] in April to discuss ablation.  2. Essential hypertension: Blood pressure is well controlled on current medications.  3. History of aortic valve replacement with bioprosthetic valve: This was functioning normally on recent echo.  4.  Recurrent chest pain in setting of stress and anxiety.  Stress test in the past was normal.   Disposition: Keep follow-up with Dr. [name] in April.  FU with me in 6 months  Signed,  [name], MD  11/01/2018 2:36 PM    [place]

## 2018-11-01 NOTE — Progress Notes (Signed)
Cardiology Office Note   Date:  11/01/2018   ID:  [name], DOB [birth year], MRN [id number]  PCP:  [name], MD  Cardiologist:   [name], MD   Chief Complaint  Patient presents with  . Other    hospital follow up. Patient c/o Elevated HR and SOB. Patient states she has been taking Diltazem 120MG  and she states she has already taken 6 pills of Flecainde. unsure what she is really taken. Meds reviewed verbally with patient.       History of Present Illness: [name] is a 73 y.o. female who presents for A follow-up visit regarding persistent atrial fibrillation/flutter and valvular heart disease. 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  Cardiac catheterization before valve replacement showed no obstructive coronary artery disease. Nuclear stress test in December 2018 showed no evidence of ischemia with normal ejection fraction. Echocardiogram in August 2019 showed normal LV systolic function with mild LVH, bioprosthetic aortic valve with normal function and no evidence of pulmonary hypertension. She had atrial flutter with tachycardia in December 2019.  She underwent successful cardioversion.  Her flecainide was initially increased to 100 mg twice daily but subsequently decreased to 50 mg twice daily.  She again had recent tachycardia and went to the emergency room yesterday and was found to be in atrial flutter with RVR.  We instructed her to increase flecainide back to 100 mg twice daily and diltiazem extended release to 180 mg once daily. She continues to be tachycardic but overall she feels better.  No chest pain or worsening dyspnea.  Past Medical History:  Diagnosis Date  . Aortic stenosis due to bicuspid aortic  valve    a. s/p bioprosthetic valve replacement 2008 at [facility];  b. 01/2015 Echo: EF 60-65%, no rwma, Gr1 DD, mildly dil LA, nl RV fxn.  . Atrial fibrillation with RVR ([place]) 01/11/2015  . Atrial flutter ([place])    a. 08/2016 s/p DCCV.  Remains on flecainide 50 mg bid.  . Basal skull fracture ([place]) 20 yrs ago  . Chronic respiratory failure ([place])   . COPD (chronic obstructive pulmonary disease) ([place])    a. on home O2 at 2L since 2008  . 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 Essential hypertension 01/24/2015  . History of cardiac cath    a. 2008 prior to Aortic aneurysm repair-->nl cors.  . History of stress test    a. 10/2015 MV: no ischemia/infarct.  [name] HLD (hyperlipidemia)   . HTN (hypertension)   . Hypothyroidism   . Obesity   . PAF (paroxysmal atrial fibrillation) ([name])    a. on Eliquis; b. CHADS2VASc = 3 (HTN, age x 1, female).  . Paroxysmal atrial fibrillation ([place]) 01/24/2015  . Right upper quadrant abdominal tenderness without rebound tenderness 03/02/2018  . S/P ascending aortic aneurysm repair 2008    Past Surgical History:  Procedure Laterality Date  . ABDOMINAL AORTIC ANEURYSM REPAIR  2008  . ABDOMINAL HYSTERECTOMY    . AORTIC VALVE REPLACEMENT  2008  . CARDIAC CATHETERIZATION     [facility]  . CARDIOVERSION N/A 08/15/2018   Procedure: CARDIOVERSION;  Surgeon: [name], MD;  Location: [facility] ORS;  Service:  Cardiovascular;  Laterality: N/A;  . CARPAL TUNNEL RELEASE    . ELECTROPHYSIOLOGIC STUDY N/A 08/17/2016   Procedure: Cardioversion;  Surgeon: [name], MD;  Location: [facility] ORS;  Service: Cardiovascular;  Laterality: N/A;  . TUMOR EXCISION Left    x3 (arm)     Current Outpatient Medications  Medication Sig Dispense Refill  . acetaminophen (TYLENOL) 500 MG tablet Take 500 mg by mouth 2 (two) times daily as needed for moderate pain or headache.    . albuterol (PROVENTIL HFA;VENTOLIN HFA) 108 (90 Base) MCG/ACT inhaler Inhale 2 puffs into the  lungs every 6 (six) hours as needed for wheezing or shortness of breath. 3 Inhaler 3  . ALPRAZolam (XANAX) 0.25 MG tablet Take 2 tablets (0.5 mg total) by mouth at bedtime as needed. 60 tablet 2  . amoxicillin (AMOXIL) 500 MG capsule Take 4 tablets po one time, one hour prior to dental procedure. 30 capsule 0  . atorvastatin (LIPITOR) 40 MG tablet 1 tab po qhs 30 tablet 4  . Coenzyme Q10 (COQ10) 200 MG CAPS Take 200 mg by mouth daily.    . diclofenac sodium (VOLTAREN) 1 % GEL To the affected joint 100 Tube 3  . diltiazem (CARDIZEM CD) 180 MG 24 hr capsule Take 1 capsule (180 mg total) by mouth daily. 30 capsule 3  . DULoxetine (CYMBALTA) 60 MG capsule Take 1 capsule (60 mg total) by mouth daily. 90 capsule 1  . ELIQUIS 5 MG TABS tablet Take 1 tablet (5 mg total) by mouth 2 (two) times daily. 60 tablet 5  . ferrous sulfate (FERROUSUL) 325 (65 FE) MG tablet Take 325 mg by mouth daily with breakfast.    . flecainide (TAMBOCOR) 50 MG tablet Take 2 tablets (100 mg total) by mouth 2 (two) times daily. 120 tablet 0  . hydrALAZINE (APRESOLINE) 25 MG tablet Take 1 tablet (25 mg total) by mouth 2 (two) times daily. 180 tablet 0  . Ketotifen Fumarate (ALAWAY OP) Apply 1 drop to eye daily as needed (allergies).    [name] levothyroxine (SYNTHROID, LEVOTHROID) 25 MCG tablet Take 1 tablet (25 mcg total) by mouth daily before breakfast. 90 tablet 1  . lisinopril-hydrochlorothiazide (PRINZIDE,ZESTORETIC) 20-25 MG tablet Take 1 tablet by mouth daily. 90 tablet 3  . loratadine (CLARITIN) 10 MG tablet Take 10 mg by mouth daily.    . OXYGEN Inhale into the lungs.    . pantoprazole (PROTONIX) 40 MG tablet Take 1 tablet (40 mg total) by mouth daily. 90 tablet 1  . potassium chloride SA (K-DUR,KLOR-CON) 20 MEQ tablet Take 1 tablet (20 mEq total) by mouth 2 (two) times daily. 180 tablet 3  . sodium chloride (OCEAN) 0.65 % SOLN nasal spray Place 1 spray into both nostrils daily as needed for congestion.    . traZODone  (DESYREL) 50 MG tablet Take 1 tablet (50 mg total) by mouth at bedtime. 90 tablet 1   No current facility-administered medications for this visit.     Allergies:   Benadryl [diphenhydramine hcl (sleep)]; Cetirizine; Diphenhydramine-zinc acetate; Lasix [furosemide]; Levaquin [levofloxacin in d5w]; Levofloxacin; Meloxicam; Soy allergy; and Sulfa antibiotics    Social History:  The patient  reports that she has quit smoking. She has never used smokeless tobacco. She reports that she does not drink alcohol or use drugs.   Family History:  The patient's family history includes Stroke in her father and paternal grandmother.    ROS:  Please see the history of present illness.   Otherwise, review  of systems are positive for none.   All other systems are reviewed and negative.    PHYSICAL EXAM: VS:  BP 138/80 (BP Location: Right Arm, Patient Position: Sitting, Cuff Size: Normal)   Pulse (!) 116   Ht 4\' 10"  (1.473 m)   Wt 183 lb (83 kg)   BMI 38.25 kg/m  , BMI Body mass index is 38.25 kg/m. GEN: Well nourished, well developed, in no acute distress  HEENT: normal  Neck: no JVD, carotid bruits, or masses Cardiac: Regular but tachycardic; no  rubs, or gallops,no edema . There  is a 2/6 systolic ejection murmur in the aortic area.  Respiratory:  clear to auscultation bilaterally, normal work of breathing GI: soft, nontender, nondistended, + BS MS: no deformity or atrophy  Skin: warm and dry, no rash Neuro:  Strength and sensation are intact Psych: euthymic mood, full affect   EKG:  EKG is ordered today. EKG showed atypical atrial flutter with ventricular rate of 116 bpm.  LVH with repolarization abnormalities and left anterior fascicular block.  Recent Labs: 10/31/2018: BUN 10; Creatinine, Ser 0.61; Hemoglobin 12.2; Magnesium 2.0; Platelets 319; Potassium 3.5; Sodium 134; TSH 1.755    Lipid Panel No results found for: CHOL, TRIG, HDL, CHOLHDL, VLDL, LDLCALC, LDLDIRECT    Wt Readings  from Last 3 Encounters:  11/01/18 183 lb (83 kg)  10/31/18 180 lb (81.6 kg)  10/04/18 185 lb (83.9 kg)         ASSESSMENT AND PLAN:  1.  Paroxysmal Atrial flutter: The patient has known history of paroxysmal atrial fibrillation documented on EKGs in 2016.  However, most recent rhythm problems seem to be related to atypical atrial flutter.  I agree with increasing flecainide to 100 mg twice daily and diltiazem extended release 180 mg once daily.  Continue anticoagulation with Eliquis.  I asked her to call our office in 1 week if her heart rate continues to be above 100.  That typically indicates that she is still in atrial flutter and in that situation she will need repeat cardioversion.   She has an appointment scheduled with Dr. [name] in April to discuss ablation.  2. Essential hypertension: Blood pressure is well controlled on current medications.  3. History of aortic valve replacement with bioprosthetic valve: This was functioning normally on recent echo.  4.  Recurrent chest pain in setting of stress and anxiety.  Stress test in the past was normal.   Disposition: Keep follow-up with Dr. [name] in April.  FU with me in 6 months  Signed,  [name], MD  11/01/2018 2:36 PM    [place]

## 2018-11-01 NOTE — Patient Instructions (Signed)
Call back in one week for heart rate greater than 100 Keep Follow up scheduled with Dr. [name] in April  Medication Instructions:  No changes If you need a refill on your cardiac medications before your next appointment, please call your pharmacy.   Lab work: No Changes If you have labs (blood work) drawn today and your tests are completely normal, you will receive your results only by: [name] MyChart Message (if you have MyChart) OR . A paper copy in the mail If you have any lab test that is abnormal or we need to change your treatment, we will call you to review the results.  Testing/Procedures: None  Follow-Up: At [facility], you and your health needs are our priority.  As part of our continuing mission to provide you with exceptional heart care, we have created designated Provider Care Teams.  These Care Teams include your primary Cardiologist (physician) and Advanced Practice Providers (APPs -  Physician Assistants and Nurse Practitioners) who all work together to provide you with the care you need, when you need it. You will need a follow up appointment in 6 months.  Please call our office 2 months in advance to schedule this appointment.  You may see Dr. Fletcher Anon or one of the following Advanced Practice Providers on your designated Care Team:   Murray Hodgkins, NP Christell Faith, PA-C . Marrianne Mood, PA-C  Any Other Special Instructions Will Be Listed Below (If Applicable).

## 2018-11-01 NOTE — Telephone Encounter (Signed)
Call to patient, seen in ED yesterday for a fib. Told to f/u in the next 3 days. appt scheduled for this afternoon per pt request for 1st available.   Advised pt to call for any further questions or concerns.

## 2018-11-01 NOTE — Telephone Encounter (Signed)
Patient calling  States that she was released from ED yesterday and was told to schedule appointment with Dr Fletcher Anon in 3 days Patient is still having issues and wants to be seen ASAP Please call to discuss

## 2018-11-02 ENCOUNTER — Telehealth: Payer: Self-pay

## 2018-11-02 DIAGNOSIS — J449 Chronic obstructive pulmonary disease, unspecified: Secondary | ICD-10-CM | POA: Diagnosis not present

## 2018-11-02 MED ORDER — FLECAINIDE ACETATE 50 MG PO TABS: 100.0000 mg | ORAL_TABLET | Freq: Two times a day (BID) | ORAL | 1 refills | Status: DC

## 2018-11-02 NOTE — Telephone Encounter (Signed)
RX sent to [facility] For Flecainide.

## 2018-11-02 NOTE — Addendum Note (Signed)
Addended by: Janan Ridge on: 11/02/2018 11:23 AM   Modules accepted: Orders

## 2018-11-07 ENCOUNTER — Other Ambulatory Visit: Payer: Self-pay

## 2018-11-07 ENCOUNTER — Telehealth: Payer: Self-pay | Admitting: Cardiovascular Disease

## 2018-11-07 MED ORDER — ATORVASTATIN CALCIUM 40 MG PO TABS: ORAL_TABLET | ORAL | 4 refills | Status: DC

## 2018-11-07 NOTE — Telephone Encounter (Signed)
Patient c/o Palpitations:  High priority if patient c/o lightheadedness, shortness of breath, or chest pain  1) How long have you had palpitations/irregular HR/ Afib? Are you having the symptoms now? Recent ED visit last Monday .  Yes .  Patient instructed at last ov after med change to call today if no change   2) Are you currently experiencing lightheadedness, SOB or CP?  SOB interim cp and lightheadedness   3) Do you have a history of afib (atrial fibrillation) or irregular heart rhythm? yes  4) Have you checked your BP or HR? (document readings if available):  HR trend 89-110  5) Are you experiencing any other symptoms?  Feels HR changing dizziness
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
## 2018-11-07 NOTE — Telephone Encounter (Signed)
No answer. Left message to call back.   

## 2018-11-07 NOTE — Telephone Encounter (Signed)
Yes , lets schedule DCCV on the 9th.

## 2018-11-07 NOTE — Telephone Encounter (Signed)
Called and spoke with Anesthesiologist, Dr Dawna Part., and she confirmed second cardioversion on Monday, 11/11/18 by Dr Fletcher Anon was approved.   Called scheduling and added patient. Message sent to precert.

## 2018-11-07 NOTE — Telephone Encounter (Signed)
Patient calling back. She verbalized understanding to arrive at 0700 am to the [facility] on Monday, 11/14/18. Patient aware not to miss any doses of Eliquis. She verbalized understanding to not have anything to eat or drink after midnight that day and to take medications with a small sip of water.  Patient had labs on 10/31/18 so does not need to get any more prior to cardioversion.  She is concerned that she will not have a ride. We discussed possible options. She will ask her friend if she can bring her and see if her sister could pick her up. She will let us know if any questions or concerns arise.

## 2018-11-08 ENCOUNTER — Telehealth: Payer: Self-pay | Admitting: *Deleted

## 2018-11-08 NOTE — Telephone Encounter (Signed)
Patient returning call.  Patient aware of new time for procedure .  Patient expressed concern for frequency of afib episode.

## 2018-11-08 NOTE — Telephone Encounter (Signed)
No answer. Left message to call back if she still has any questions and concerns.

## 2018-11-08 NOTE — Telephone Encounter (Signed)
Patient's cardioversion for 11/14/18 needed to be moved up to 0730 am with arrival time of 06:30 am.  No answer. Left message to call back.

## 2018-11-13 ENCOUNTER — Encounter: Payer: Self-pay | Admitting: Anesthesiology

## 2018-11-14 ENCOUNTER — Other Ambulatory Visit: Payer: Self-pay

## 2018-11-14 ENCOUNTER — Ambulatory Visit
Admission: RE | Admit: 2018-11-14 | Discharge: 2018-11-14 | Disposition: A | Discharge status: Home / Self Care | Payer: Medicare Other | Attending: Cardiovascular Disease | Admitting: Cardiovascular Disease

## 2018-11-14 ENCOUNTER — Ambulatory Visit: Payer: Medicare Other | Admitting: Anesthesiology

## 2018-11-14 ENCOUNTER — Encounter
Admission: RE | Disposition: A | Discharge status: Home / Self Care | Payer: Self-pay | Source: Home / Self Care | Attending: Cardiovascular Disease

## 2018-11-14 ENCOUNTER — Telehealth: Payer: Self-pay | Admitting: Cardiovascular Disease

## 2018-11-14 DIAGNOSIS — Z7901 Long term (current) use of anticoagulants: Secondary | ICD-10-CM | POA: Insufficient documentation

## 2018-11-14 DIAGNOSIS — Z7951 Long term (current) use of inhaled steroids: Secondary | ICD-10-CM | POA: Insufficient documentation

## 2018-11-14 DIAGNOSIS — Z881 Allergy status to other antibiotic agents status: Secondary | ICD-10-CM | POA: Diagnosis not present

## 2018-11-14 DIAGNOSIS — Z882 Allergy status to sulfonamides status: Secondary | ICD-10-CM | POA: Insufficient documentation

## 2018-11-14 DIAGNOSIS — Z79899 Other long term (current) drug therapy: Secondary | ICD-10-CM | POA: Insufficient documentation

## 2018-11-14 DIAGNOSIS — M1990 Unspecified osteoarthritis, unspecified site: Secondary | ICD-10-CM | POA: Insufficient documentation

## 2018-11-14 DIAGNOSIS — I350 Nonrheumatic aortic (valve) stenosis: Secondary | ICD-10-CM | POA: Insufficient documentation

## 2018-11-14 DIAGNOSIS — I441 Atrioventricular block, second degree: Secondary | ICD-10-CM | POA: Insufficient documentation

## 2018-11-14 DIAGNOSIS — I491 Atrial premature depolarization: Secondary | ICD-10-CM | POA: Insufficient documentation

## 2018-11-14 DIAGNOSIS — Z823 Family history of stroke: Secondary | ICD-10-CM | POA: Insufficient documentation

## 2018-11-14 DIAGNOSIS — Z791 Long term (current) use of non-steroidal anti-inflammatories (NSAID): Secondary | ICD-10-CM | POA: Diagnosis not present

## 2018-11-14 DIAGNOSIS — J449 Chronic obstructive pulmonary disease, unspecified: Secondary | ICD-10-CM | POA: Insufficient documentation

## 2018-11-14 DIAGNOSIS — I484 Atypical atrial flutter: Secondary | ICD-10-CM | POA: Insufficient documentation

## 2018-11-14 DIAGNOSIS — I10 Essential (primary) hypertension: Secondary | ICD-10-CM | POA: Diagnosis not present

## 2018-11-14 DIAGNOSIS — Z6838 Body mass index (BMI) 38.0-38.9, adult: Secondary | ICD-10-CM | POA: Diagnosis not present

## 2018-11-14 DIAGNOSIS — I4891 Unspecified atrial fibrillation: Secondary | ICD-10-CM | POA: Diagnosis not present

## 2018-11-14 DIAGNOSIS — H9192 Unspecified hearing loss, left ear: Secondary | ICD-10-CM | POA: Diagnosis not present

## 2018-11-14 DIAGNOSIS — E039 Hypothyroidism, unspecified: Secondary | ICD-10-CM | POA: Insufficient documentation

## 2018-11-14 DIAGNOSIS — I480 Paroxysmal atrial fibrillation: Secondary | ICD-10-CM | POA: Insufficient documentation

## 2018-11-14 DIAGNOSIS — I483 Typical atrial flutter: Secondary | ICD-10-CM | POA: Diagnosis not present

## 2018-11-14 DIAGNOSIS — F419 Anxiety disorder, unspecified: Secondary | ICD-10-CM | POA: Diagnosis not present

## 2018-11-14 DIAGNOSIS — Z87891 Personal history of nicotine dependence: Secondary | ICD-10-CM | POA: Diagnosis not present

## 2018-11-14 DIAGNOSIS — E785 Hyperlipidemia, unspecified: Secondary | ICD-10-CM | POA: Diagnosis not present

## 2018-11-14 DIAGNOSIS — Z953 Presence of xenogenic heart valve: Secondary | ICD-10-CM | POA: Diagnosis not present

## 2018-11-14 DIAGNOSIS — Z888 Allergy status to other drugs, medicaments and biological substances status: Secondary | ICD-10-CM | POA: Insufficient documentation

## 2018-11-14 DIAGNOSIS — I4819 Other persistent atrial fibrillation: Secondary | ICD-10-CM | POA: Insufficient documentation

## 2018-11-14 DIAGNOSIS — Z8673 Personal history of transient ischemic attack (TIA), and cerebral infarction without residual deficits: Secondary | ICD-10-CM | POA: Insufficient documentation

## 2018-11-14 DIAGNOSIS — Z9981 Dependence on supplemental oxygen: Secondary | ICD-10-CM | POA: Diagnosis not present

## 2018-11-14 HISTORY — PX: CARDIOVERSION: EP1203

## 2018-11-14 SURGERY — CARDIOVERSION (CATH LAB)
Anesthesia: General

## 2018-11-14 MED ORDER — ONDANSETRON HCL 4 MG/2ML IJ SOLN: 4.0000 mg | Freq: Once | INTRAMUSCULAR | Status: DC | PRN

## 2018-11-14 MED ORDER — PROPOFOL 10 MG/ML IV BOLUS
INTRAVENOUS | Status: AC
  Filled 2018-11-14: qty 40

## 2018-11-14 MED ORDER — PROPOFOL 10 MG/ML IV BOLUS
INTRAVENOUS | Status: DC | PRN
  Administered 2018-11-14: 60 mg via INTRAVENOUS

## 2018-11-14 MED ORDER — FENTANYL CITRATE (PF) 100 MCG/2ML IJ SOLN: 25.0000 ug | INTRAMUSCULAR | Status: DC | PRN

## 2018-11-14 MED ORDER — SODIUM CHLORIDE 0.9 % IV SOLN
INTRAVENOUS | Status: DC
  Administered 2018-11-14: 1000 mL via INTRAVENOUS

## 2018-11-14 NOTE — Anesthesia Preprocedure Evaluation (Addendum)
Anesthesia Evaluation  Patient identified by MRN, date of birth, ID band Patient awake    Reviewed: Allergy & Precautions, NPO status , Patient's Chart, lab work & pertinent test results, reviewed documented beta blocker date and time   Airway Mallampati: III  TM Distance: >3 FB     Dental  (+) Chipped   Pulmonary COPD, former smoker,           Cardiovascular hypertension, Pt. on medications + dysrhythmias Atrial Fibrillation      Neuro/Psych PSYCHIATRIC DISORDERS Anxiety TIA   GI/Hepatic   Endo/Other  Hypothyroidism Morbid obesity  Renal/GU      Musculoskeletal  (+) Arthritis ,   Abdominal   Peds  Hematology   Anesthesia Other Findings Denies stroke, On O2. EF 60-65 2016. No cardiac stents.  Reproductive/Obstetrics                            Anesthesia Physical Anesthesia Plan  ASA: III  Anesthesia Plan: General   Post-op Pain Management:    Induction: Intravenous  PONV Risk Score and Plan:   Airway Management Planned:   Additional Equipment:   Intra-op Plan:   Post-operative Plan:   Informed Consent: I have reviewed the patients History and Physical, chart, labs and discussed the procedure including the risks, benefits and alternatives for the proposed anesthesia with the patient or authorized representative who has indicated his/her understanding and acceptance.       Plan Discussed with: CRNA  Anesthesia Plan Comments:         Anesthesia Quick Evaluation

## 2018-11-14 NOTE — Anesthesia Post-op Follow-up Note (Signed)
Anesthesia QCDR form completed.        

## 2018-11-14 NOTE — Interval H&P Note (Signed)
History and Physical Interval Note:  11/14/2018 8:07 AM  [name]  has presented today for surgery, with the diagnosis of Cardioversion   Afib.  The various methods of treatment have been discussed with the patient and family. After consideration of risks, benefits and other options for treatment, the patient has consented to  Procedure(s): CARDIOVERSION (CATH LAB) (N/A) as a surgical intervention.  The patient's history has been reviewed, patient examined, no change in status, stable for surgery.  I have reviewed the patient's chart and labs.  Questions were answered to the patient's satisfaction.     [name]

## 2018-11-14 NOTE — Transfer of Care (Signed)
Immediate Anesthesia Transfer of Care Note  Patient: [name]  Procedure(s) Performed: CARDIOVERSION (CATH LAB) (N/A )  Patient Location: PACU  Anesthesia Type:General  Level of Consciousness: sedated  Airway & Oxygen Therapy: Patient Spontanous Breathing and Patient connected to nasal cannula oxygen  Post-op Assessment: Report given to RN and Post -op Vital signs reviewed and stable  Post vital signs: Reviewed and stable  Last Vitals:  Vitals Value Taken Time  BP 116/77 11/14/2018  7:47 AM  Temp    Pulse 76 11/14/2018  7:48 AM  Resp 17 11/14/2018  7:48 AM  SpO2 90 % 11/14/2018  7:48 AM    Last Pain:  Vitals:   11/14/18 0700  TempSrc: Oral  PainSc: 0-No pain         Complications: No apparent anesthesia complications

## 2018-11-14 NOTE — Telephone Encounter (Signed)
STAT if HR is under 50 or over 120 (normal HR is 60-100 beats per minute)  1) What is your heart rate? 66-72  2) Do you have a log of your heart rate readings (document readings)? No, patient has been in hospital, Dr Fletcher Anon shocked heart today  3) Do you have any other symptoms? Patient is at home and still having episodes, feels like she might faint sometimes - she feels flushed, like blood rushing through.  Patient states not sure how to describe it but would like to know if this is normal and when it may stop.  Please call to discuss.

## 2018-11-14 NOTE — CV Procedure (Signed)
Cardioversion note: A standard informed consent was obtained. Timeout was performed. The pads were placed in the anterior posterior fashion. The patient was given propofol by the anesthesia team.  Successful cardioversion was performed with a 100 J. The patient converted to sinus rhythm.  The patient had prolonged sinus pause before getting back into sinus.  She continued to have intermittent sinus pauses after cardioversion. Pre-and post EKGs were reviewed. The patient tolerated the procedure with no immediate complications.  Recommendations: Monitor closely and if episodes of bradycardia persist, recommend stopping diltiazem.

## 2018-11-14 NOTE — Anesthesia Postprocedure Evaluation (Signed)
Anesthesia Post Note  Patient: [name]  Procedure(s) Performed: CARDIOVERSION (CATH LAB) (N/A )  Patient location during evaluation: [facility] Recovery Anesthesia Type: General Level of consciousness: awake and alert Pain management: pain level controlled Vital Signs Assessment: post-procedure vital signs reviewed and stable Respiratory status: spontaneous breathing, nonlabored ventilation, respiratory function stable and patient connected to nasal cannula oxygen Cardiovascular status: blood pressure returned to baseline and stable Postop Assessment: no apparent nausea or vomiting Anesthetic complications: no     Last Vitals:  Vitals:   11/14/18 0845 11/14/18 0900  BP: 122/66 115/73  Pulse: 68 68  Resp: 18 17  Temp:    SpO2: 98% 99%    Last Pain:  Vitals:   11/14/18 0700  TempSrc: Oral  PainSc: 0-No pain                 Denim Kalmbach S

## 2018-11-14 NOTE — Progress Notes (Signed)
Patient continues to have multiple pauses after cardioversion. MD notified. States to monitor patient for one more hour and update him at that point. Patient may need admission to hospital if not improved.

## 2018-11-14 NOTE — Anesthesia Procedure Notes (Signed)
Performed by: Idaliz Tinkle, CRNA Pre-anesthesia Checklist: Patient identified, Emergency Drugs available, Suction available, Patient being monitored and Timeout performed Patient Re-evaluated:Patient Re-evaluated prior to induction Oxygen Delivery Method: Nasal cannula Induction Type: IV induction       

## 2018-11-15 NOTE — Telephone Encounter (Signed)
Called patient. Reports yesterday she had an episode of feeling flushed after getting home from the hospital post cardioversion. Today, she feels fine. She is monitoring her HR by Pulse Ox and currently it reads 88-91. She says it goes up to 89-91 when she is up moving around. Then is might be in the 70's at rest. Advised that it is normal for HR to increase with more activity and that she is stable at this time. Denies chest pain, dizziness, shortness of breath. She just wanted to make sure she was ok right now. Advised her to monitor about once a day unless she is feeling symptoms. She is scheduled to see Dr [name] 3/19. She will call us back if any new questions or concerns arise.

## 2018-11-16 ENCOUNTER — Telehealth: Payer: Self-pay | Admitting: Cardiovascular Disease

## 2018-11-16 NOTE — Telephone Encounter (Signed)
Returned the call to the patient. She stated that her blood pressures have been increased lately. Yesterday it was 151/87 and today it was 149/91 with a heart rate of 83. She did not have any more readings to give.  She stated that when her blood pressure gets to this range that she develops a headache.   She is currently taking: Flecainide 100 mg bid Hydralazine 25 mg bid Prinzide 20-25 mg daily  She would like to know if she can restart diltiazem but at 120 mg daily or if anything additional was needed at this point. She has been advised to keep checking her blood pressure and to keep a log so that we may see the trend.   She has an appointment with Dr. [name] on 11/24/2018

## 2018-11-16 NOTE — Telephone Encounter (Signed)
Pt c/o BP issue: STAT if pt c/o blurred vision, one-sided weakness or slurred speech  1. What are your last 5 BP readings?  Today-149/91 HR 83 Took it yesterdy, but doesn't remember what it was  2. Are you having any other symptoms (ex. Dizziness, headache, blurred vision, passed out)? headache  3. What is your BP issue? elevated

## 2018-11-16 NOTE — Telephone Encounter (Signed)
Left a message for the patient to call back.  

## 2018-11-17 NOTE — Telephone Encounter (Signed)
Patient calling with blood pressure readings 3/11 5pm 172/107 pulse 85 3/11 10 pm 144/78 pulse 85  3/12 - this morning  No medicine 188/107 pulse 91 After medicine, about 2 hours later 155/90 pulse 85 This afternoon  155/93 pulse 90  Patient has been having headaches, please call to discuss.

## 2018-11-17 NOTE — Telephone Encounter (Signed)
The patient was calling to give her latest blood pressure readings. She again inquired about being prescribe Diltiazem 120 mg because she feels like her blood pressure is too high even after the medications.

## 2018-11-18 ENCOUNTER — Ambulatory Visit: Payer: Medicare Other | Admitting: Cardiovascular Disease

## 2018-11-18 MED ORDER — DILTIAZEM HCL ER COATED BEADS 120 MG PO CP24: 120.0000 mg | ORAL_CAPSULE | Freq: Every day | ORAL | Status: DC

## 2018-11-18 NOTE — Telephone Encounter (Signed)
Patient has been made aware that it is okay to restart the Diltiazem 120 mg ER daily. She will keep track of her blood pressure and heart rates and call back if it does not decrease or decreases too much.

## 2018-11-18 NOTE — Telephone Encounter (Signed)
Okay to resume diltiazem extended release 120 mg once daily.  Continue flecainide 100 mg twice daily.

## 2018-11-18 NOTE — Telephone Encounter (Signed)
Left a message for the patient to call back.  

## 2018-11-21 ENCOUNTER — Telehealth: Payer: Self-pay | Admitting: Internal Medicine

## 2018-11-21 NOTE — Telephone Encounter (Signed)
CTSP regarding appt on Thursday    Her BP and HR has stablilized and resumed diltiazem 120   She is feeling better  She is agreeable to postponing her OV   Will reach out in about 4-6 weeks when things have clarified

## 2018-11-22 NOTE — Telephone Encounter (Signed)
I spoke with the patient. I have rescheduled her Consult appt with Dr. [name] to Thursday 4/23 at 11:00 am.

## 2018-11-24 ENCOUNTER — Ambulatory Visit: Payer: Medicare Other | Admitting: Internal Medicine

## 2018-11-29 ENCOUNTER — Other Ambulatory Visit: Payer: Self-pay

## 2018-11-29 MED ORDER — ALBUTEROL SULFATE HFA 108 (90 BASE) MCG/ACT IN AERS: 2.0000 | INHALATION_SPRAY | Freq: Four times a day (QID) | RESPIRATORY_TRACT | 3 refills | Status: DC | PRN

## 2018-11-30 ENCOUNTER — Other Ambulatory Visit: Payer: Self-pay | Admitting: Cardiovascular Disease

## 2018-11-30 ENCOUNTER — Telehealth: Payer: Self-pay

## 2018-11-30 MED ORDER — DILTIAZEM HCL ER COATED BEADS 120 MG PO CP24: 120.0000 mg | ORAL_CAPSULE | Freq: Every day | ORAL | 3 refills | Status: DC

## 2018-11-30 NOTE — Telephone Encounter (Signed)
Refill sent for Diltiazem 120 mg to [facility] 90 day supply with 3 refills.

## 2018-11-30 NOTE — Telephone Encounter (Signed)
LMOM for patient to clarify the Diltiazem dosage requested. The most recent phone call stated patient taking Diltiazem 120 Mg this dosage is also what is listed on medication list.  The request is for Diltiazem 180 mg.

## 2018-11-30 NOTE — Telephone Encounter (Signed)
°*  STAT* If patient is at the pharmacy, call can be transferred to refill team.   1. Which medications need to be refilled? (please list name of each medication and dose if known)       Diltiazem 180 mg po q d   2. Which pharmacy/location (including street and city if local pharmacy) is medication to be sent to?       [facility]   3. Do they need a 30 day or 90 day supply?  [place]

## 2018-12-01 DIAGNOSIS — J449 Chronic obstructive pulmonary disease, unspecified: Secondary | ICD-10-CM | POA: Diagnosis not present

## 2018-12-05 ENCOUNTER — Other Ambulatory Visit: Payer: Self-pay | Admitting: Cardiovascular Disease

## 2018-12-06 ENCOUNTER — Other Ambulatory Visit: Payer: Self-pay | Admitting: Internal Medicine

## 2018-12-06 MED ORDER — APIXABAN 5 MG PO TABS: ORAL_TABLET | ORAL | 3 refills | Status: DC

## 2018-12-06 MED ORDER — HYDRALAZINE HCL 25 MG PO TABS: 25.0000 mg | ORAL_TABLET | Freq: Two times a day (BID) | ORAL | 2 refills | Status: DC

## 2018-12-06 MED ORDER — FLECAINIDE ACETATE 50 MG PO TABS: 100.0000 mg | ORAL_TABLET | Freq: Two times a day (BID) | ORAL | 2 refills | Status: DC

## 2018-12-06 NOTE — Telephone Encounter (Signed)
Verified with patient the pharmacy and refills needed. Refills sent to pharmacy for flecainide, Eliquis, and hydralazine.

## 2018-12-06 NOTE — Telephone Encounter (Signed)
°*  STAT* If patient is at the pharmacy, call can be transferred to refill team.   1. Which medications need to be refilled? (please list name of each medication and dose if known) Eliquis 5 mg, flecainide 50 mg, hydralazine 25 mg  2. Which pharmacy/location (including street and city if local pharmacy) is medication to be sent to? [phone]  3. Do they need a 30 day or 90 day supply? [place]

## 2018-12-07 ENCOUNTER — Other Ambulatory Visit: Payer: Self-pay

## 2018-12-09 ENCOUNTER — Other Ambulatory Visit: Payer: Self-pay | Admitting: Internal Medicine

## 2018-12-09 MED ORDER — POTASSIUM CHLORIDE CRYS ER 20 MEQ PO TBCR: 20.0000 meq | EXTENDED_RELEASE_TABLET | Freq: Two times a day (BID) | ORAL | 3 refills | Status: DC

## 2018-12-20 ENCOUNTER — Telehealth: Payer: Self-pay

## 2018-12-20 NOTE — Telephone Encounter (Signed)
Virtual Visit Pre-Appointment Phone Call    Confirm consent - "In the setting of the current Covid19 crisis, you are scheduled for a TELEPHONE visit with your provider on  12/29/2018 at 11:00. Just as we do with many in-office visits, in order for you to participate in this visit, we must obtain consent.  I can obtain your verbal consent now.  All virtual visits are billed to your insurance company just like a normal visit would be.  By agreeing to a virtual visit, we'd like you to understand that the technology does not allow for your provider to perform an examination, and thus may limit your provider's ability to fully assess your condition.  Finally, though the technology is pretty good, we cannot assure that it will always work on either your or our end, and in the setting of a video visit, we may have to convert it to a phone-only visit.  In either situation, we cannot ensure that we have a secure connection.  Are you willing to proceed? YES    TELEPHONE CALL NOTE  [name] has been deemed a candidate for a follow-up tele-health visit to limit community exposure during the Covid-19 pandemic. I spoke with the patient via phone to ensure availability of phone/video source, confirm preferred email & phone number, and discuss instructions and expectations.  I reminded [name] to be prepared with any vital sign and/or heart rhythm information that could potentially be obtained via home monitoring, at the time of her visit. I reminded [name] to expect a phone call at the time of her visit if her visit.  Alba Destine, RMA 12/20/2018 2:15 PM      -   FULL LENGTH CONSENT FOR TELE-HEALTH VISIT   I hereby voluntarily request, consent and authorize [facility] and its employed or contracted physicians, physician assistants, nurse practitioners or other licensed health care professionals (the Practitioner), to provide me with telemedicine health care services (the  "Services") as deemed necessary by the treating Practitioner. I acknowledge and consent to receive the Services by the Practitioner via telemedicine. I understand that the telemedicine visit will involve communicating with the Practitioner through live audiovisual communication technology and the disclosure of certain medical information by electronic transmission. I acknowledge that I have been given the opportunity to request an in-person assessment or other available alternative prior to the telemedicine visit and am voluntarily participating in the telemedicine visit.  I understand that I have the right to withhold or withdraw my consent to the use of telemedicine in the course of my care at any time, without affecting my right to future care or treatment, and that the Practitioner or I may terminate the telemedicine visit at any time. I understand that I have the right to inspect all information obtained and/or recorded in the course of the telemedicine visit and may receive copies of available information for a reasonable fee.  I understand that some of the potential risks of receiving the Services via telemedicine include:  [name] Delay or interruption in medical evaluation due to technological equipment failure or disruption; . Information transmitted may not be sufficient (e.g. poor resolution of images) to allow for appropriate medical decision making by the Practitioner; and/or  . In rare instances, security protocols could fail, causing a breach of personal health information.  Furthermore, I acknowledge that it is my responsibility to provide information about my medical history, conditions and care that is complete and accurate to the  best of my ability. I acknowledge that Practitioner's advice, recommendations, and/or decision may be based on factors not within their control, such as incomplete or inaccurate data provided by me or distortions of diagnostic images or specimens that may result from  electronic transmissions. I understand that the practice of medicine is not an exact science and that Practitioner makes no warranties or guarantees regarding treatment outcomes. I acknowledge that I will receive a copy of this consent concurrently upon execution via email to the email address I last provided but may also request a printed copy by calling the office of [facility].    I understand that my insurance will be billed for this visit.   I have read or had this consent read to me. . I understand the contents of this consent, which adequately explains the benefits and risks of the Services being provided via telemedicine.  . I have been provided ample opportunity to ask questions regarding this consent and the Services and have had my questions answered to my satisfaction. . I give my informed consent for the services to be provided through the use of telemedicine in my medical care  By participating in this telemedicine visit I agree to the above.

## 2018-12-22 ENCOUNTER — Ambulatory Visit: Payer: Medicare Other | Admitting: Internal Medicine

## 2018-12-22 ENCOUNTER — Other Ambulatory Visit: Payer: Self-pay

## 2018-12-22 DIAGNOSIS — G4733 Obstructive sleep apnea (adult) (pediatric): Secondary | ICD-10-CM | POA: Diagnosis not present

## 2018-12-29 ENCOUNTER — Other Ambulatory Visit: Payer: Self-pay

## 2018-12-29 ENCOUNTER — Telehealth (INDEPENDENT_AMBULATORY_CARE_PROVIDER_SITE_OTHER): Payer: Medicare Other | Admitting: Internal Medicine

## 2018-12-29 VITALS — BP 141/87 | HR 68 | Ht <= 58 in | Wt 182.0 lb

## 2018-12-29 DIAGNOSIS — J449 Chronic obstructive pulmonary disease, unspecified: Secondary | ICD-10-CM

## 2018-12-29 DIAGNOSIS — I480 Paroxysmal atrial fibrillation: Secondary | ICD-10-CM

## 2018-12-29 DIAGNOSIS — I639 Cerebral infarction, unspecified: Secondary | ICD-10-CM

## 2018-12-29 DIAGNOSIS — Z9981 Dependence on supplemental oxygen: Secondary | ICD-10-CM | POA: Diagnosis not present

## 2018-12-29 DIAGNOSIS — I10 Essential (primary) hypertension: Secondary | ICD-10-CM

## 2018-12-29 DIAGNOSIS — I483 Typical atrial flutter: Secondary | ICD-10-CM

## 2018-12-29 DIAGNOSIS — Z8673 Personal history of transient ischemic attack (TIA), and cerebral infarction without residual deficits: Secondary | ICD-10-CM

## 2018-12-29 MED ORDER — FLECAINIDE ACETATE 50 MG PO TABS: 50.0000 mg | ORAL_TABLET | Freq: Two times a day (BID) | ORAL | Status: DC

## 2018-12-29 NOTE — Progress Notes (Signed)
Electrophysiology TeleHealth Note   Due to national recommendations of social distancing due to COVID 19, an audio/video telehealth visit is felt to be most appropriate for this patient at this time.  See MyChart message from today for the patient's consent to telehealth for [facility].   Date:  12/29/2018   ID:  IVALENE PLATTE, DOB 1946/07/17, MRN [id number]  Location: patient's home  Provider location: [address] [place]  Evaluation Performed: Initial Evaluation  PCP:  [name], MD  Cardiologist:  MA   Electrophysiologist:  None   Chief Complaint:  Atrial flutter   History of Present Illness:    NALLELY YOST is a 73 y.o. female who presents via audio/video conferencing for a telehealth visit today for  Atrial flutter   The patient did not have access to video technology/had technical difficulties with video requiring transitioning to audio format only (telephone).  All issues noted in this document were discussed and addressed.  No physical exam could be performed with this format.           DATE TEST EF   5/16 Echo  60-65%   12/18  Myoview   NO ischemia  12/18 Echo  55-65% LVH mild LAE ( 46.24/26)   Atrial flutter  Rx with Flecainide  Assoc with palpitations, No impact on breathing   Following last DCCV, had bradycardia and diltiazem was held and then resumed  She continues to struggle with blood pressure issues.  She is oxygen dependent COPD and has been told she cannot be put to sleep for procedures  Breathing is at baseline with her oxygen.  Denies chest pain.  No edema.   DATE PR interval QRSduration Dose  12/19   98 50  *3/20   150 341     Thromboembolic risk factors ( age -63  , HTN-1 , TIA/CVA-2, Gender-1 ) for a CHADSVASc Score of 5   Aortic stenosis s/p AVR bioprosthesis OSA CPAP HTN COPD O2 dependent 2.5 L      The patient denies symptoms of fevers, chills, cough, or new SOB worrisome for COVID 19.    Past  Medical History:  Diagnosis Date  . 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 s/p bioprosthetic valve replacement 2008 at [facility];  b. 01/2015 Echo: EF 60-65%, no rwma, Gr1 DD, mildly dil LA, nl RV fxn.  . Atrial fibrillation with RVR ([place]) 01/11/2015  . Atrial flutter ([place])    a. 08/2016 s/p DCCV.  Remains on flecainide 50 mg bid.  . Basal skull fracture ([place]) 20 yrs ago  . Chronic respiratory failure ([place])   . COPD (chronic obstructive pulmonary disease) ([place])    a. on home O2 at 2L since 2008  . 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 Essential hypertension 01/24/2015  . History of cardiac cath    a. 2008 prior to Aortic aneurysm repair-->nl cors.  . History of stress test    a. 10/2015 MV: no ischemia/infarct.  [name] HLD (hyperlipidemia)   . HTN (hypertension)   . Hypothyroidism   . Obesity   . PAF (paroxysmal atrial fibrillation) ([name])    a. on Eliquis; b. CHADS2VASc = 3 (HTN, age x 1, female).  . Paroxysmal atrial fibrillation ([place]) 01/24/2015  . Right upper quadrant abdominal tenderness without rebound tenderness 03/02/2018  . S/P ascending aortic aneurysm repair 2008    Past Surgical  History:  Procedure Laterality Date  . ABDOMINAL AORTIC ANEURYSM REPAIR  2008  . ABDOMINAL HYSTERECTOMY    . AORTIC VALVE REPLACEMENT  2008  . CARDIAC CATHETERIZATION     [facility]  . CARDIOVERSION N/A 08/15/2018   Procedure: CARDIOVERSION;  Surgeon: [name], MD;  Location: [facility] ORS;  Service: Cardiovascular;  Laterality: N/A;  . CARDIOVERSION N/A 11/14/2018   Procedure: CARDIOVERSION (CATH LAB);  Surgeon: [name], MD;  Location: [facility] ORS;  Service: Cardiovascular;  Laterality: N/A;  . CARPAL TUNNEL RELEASE    . ELECTROPHYSIOLOGIC STUDY N/A 08/17/2016   Procedure: Cardioversion;  Surgeon: [name], MD;  Location: [facility] ORS;  Service: Cardiovascular;  Laterality: N/A;  . TUMOR EXCISION Left    x3 (arm)    Current Outpatient Medications  Medication  Sig Dispense Refill  . acetaminophen (TYLENOL) 500 MG tablet Take 500 mg by mouth every 4 (four) hours as needed for moderate pain or headache.     . albuterol (PROVENTIL HFA;VENTOLIN HFA) 108 (90 Base) MCG/ACT inhaler Inhale 2 puffs into the lungs every 6 (six) hours as needed for wheezing or shortness of breath. 3 Inhaler 3  . amoxicillin (AMOXIL) 500 MG capsule Take 4 tablets po one time, one hour prior to dental procedure. 30 capsule 0  . apixaban (ELIQUIS) 5 MG TABS tablet Take 1 tablet (5 mg total) by mouth 2 (two) times daily. 180 tablet 3  . Artificial Tear Solution (GENTEAL TEARS) 0.1-0.2-0.3 % SOLN Place 1 drop into both eyes daily.    [name] atorvastatin (LIPITOR) 40 MG tablet 1 tab po qhs 30 tablet 4  . Coenzyme Q10 (COQ10) 200 MG CAPS Take 200 mg by mouth daily.    . diclofenac sodium (VOLTAREN) 1 % GEL To the affected joint (Patient taking differently: Apply 2-4 g topically 3 (three) times daily as needed (shoulder pain.). To the affected joint ) 100 Tube 3  . diltiazem (CARDIZEM CD) 120 MG 24 hr capsule Take 1 capsule (120 mg total) by mouth daily. 90 capsule 3  . DULoxetine (CYMBALTA) 60 MG capsule Take 1 capsule (60 mg total) by mouth daily. 90 capsule 1  . ferrous sulfate (FERROUSUL) 325 (65 FE) MG tablet Take 650 mg by mouth daily with breakfast.     . flecainide (TAMBOCOR) 50 MG tablet Take 2 tablets (100 mg total) by mouth 2 (two) times daily. 360 tablet 2  . hydrALAZINE (APRESOLINE) 25 MG tablet Take 1 tablet (25 mg total) by mouth 2 (two) times daily. 180 tablet 2  . levothyroxine (SYNTHROID, LEVOTHROID) 25 MCG tablet Take 1 tablet (25 mcg total) by mouth daily before breakfast. 90 tablet 1  . lisinopril-hydrochlorothiazide (PRINZIDE,ZESTORETIC) 20-25 MG tablet Take 1 tablet by mouth daily. 90 tablet 3  . loratadine (CLARITIN) 10 MG tablet Take 10 mg by mouth daily as needed for allergies.     . Naphazoline-Pheniramine (OPCON-A) 0.027-0.315 % SOLN Place 1 drop into both eyes daily.     . OXYGEN Inhale into the lungs.    . pantoprazole (PROTONIX) 40 MG tablet Take 1 tablet (40 mg total) by mouth daily. 90 tablet 1  . potassium chloride SA (K-DUR,KLOR-CON) 20 MEQ tablet Take 1 tablet (20 mEq total) by mouth 2 (two) times daily. 180 tablet 3  . sodium chloride (OCEAN) 0.65 % SOLN nasal spray Place 1 spray into both nostrils daily as needed for congestion.    . Tiotropium Bromide-Olodaterol (STIOLTO RESPIMAT) 2.5-2.5 MCG/ACT AERS Inhale 1 puff into the lungs  daily.    . traZODone (DESYREL) 50 MG tablet Take 1 tablet (50 mg total) by mouth at bedtime. 90 tablet 1  . ALPRAZolam (XANAX) 0.25 MG tablet Take 2 tablets (0.5 mg total) by mouth at bedtime as needed. (Patient not taking: Reported on 12/29/2018) 60 tablet 2   No current facility-administered medications for this visit.     Allergies:   Benadryl [diphenhydramine hcl (sleep)]; Cetirizine; Diphenhydramine-zinc acetate; Lasix [furosemide]; Levaquin [levofloxacin in d5w]; Levofloxacin; Meloxicam; Soy allergy; and Sulfa antibiotics   Social History:  The patient  reports that she has quit smoking. She has never used smokeless tobacco. She reports that she does not drink alcohol or use drugs.   Family History:  The patient's *  family history includes Stroke in her father and paternal grandmother.   ROS:  Please see the history of present illness.   All other systems are personally reviewed and negative.    Exam:    Vital Signs:  BP (!) 141/87 (BP Location: Left Arm, Patient Position: Sitting, Cuff Size: Normal)   Pulse 68   Ht 4\' 10"  (1.473 m)   Wt 182 lb (82.6 kg)   BMI 38.04 kg/m     Well appearing, alert and conversant, regular work of breathing,  good skin color Eyes- anicteric, neuro- grossly intact, skin- no apparent rash or lesions or cyanosis, mouth- oral mucosa is pink   Labs/Other Tests and Data Reviewed:    Recent Labs: 10/31/2018: BUN 10; Creatinine, Ser 0.61; Hemoglobin 12.2; Magnesium 2.0; Platelets  319; Potassium 3.5; Sodium 134; TSH 1.755   Wt Readings from Last 3 Encounters:  12/29/18 182 lb (82.6 kg)  11/14/18 183 lb (83 kg)  11/01/18 183 lb (83 kg)     Other studies personally reviewed: Additional studies/ records that were reviewed today include:  As above   Review of the above records today demonstrates: apixoban   Prior radiographs: not   . On my review, ECG tracings reveal 3/20  Sinus with IVCD 3/20 Afl 2:1 AVB (typical) ACl 310  2/20   Aflut  2;1 AVB ACl 260  12/19 AFlutter 2:1AVB 5/16 AFlutter >> Atrial Fibrillation ASSESSMENT & PLAN:    Atrial flutter/atrial fibrillation  Oxygen dependent COPD  Hypertension  Morbid obesity  Prior stroke-lacunar  The patient has had recurrent episodes of atrial flutter.  There was a hiatus of 4 years before she had a recurrent event 12/19 and then just 2 months before her next event 2/20.  A tracing 5/16 demonstrated atrial fibrillation having presented however in atrial flutter.  Normally this would suggest that atrial flutter is a primary arrhythmia and the catheter ablation would  be of an appropriate next strategy; however, her stroke and her oxygen dependent COPD and pulmonary recommendations that she not be put to sleep make that strategy far less appealing.  Hence, for now, would continue the strategy of flecainide and Eliquis.  She needs concomitant AV nodal blocking therapy as she is on diltiazem.  Her atrial flutter cycle length slowed considerably between February and March 2020 with up titration of her flecainide.  This is not surprising; however, there was also a marked change in her QRS duration from 98--150 ms with the up titration.  Hence, we will need to reduce the flecainide dose, and we can try an intermediate dose of 75 mg.  She will need an ECG thereafter.  And given her lung condition and COVID, would suggest that what we do is drop her to 50  mg in the short-term as we know her QRS duration is not  excessively low and then when COVID has abated increase it to 75 mg and check ECG at that time.      COVID 19 screen The patient denies symptoms of COVID 19 at this time.  The importance of social distancing was discussed today.  Follow-up:  Prn  Next remote: na  Current medicines are reviewed at length with the patient today.   The patient does not have concerns regarding her medicines.  The following changes were made today:   See Below Decrease flecainide to 50 mg twice daily  Labs/ tests ordered today include:   No orders of the defined types were placed in this encounter.      Patient Risk:  after full review of this patients clinical status, I feel that they are at moderate risk at this time.  Today, I have spent 27  minutes with the patient with telehealth technology discussing As above   Will discuss with  Dr MA * .    Signed, Virl Axe, MD  12/29/2018 11:14 AM     [facility] [address] [place] [address] [phone] (office) [phone] (fax)

## 2018-12-29 NOTE — Addendum Note (Signed)
Addended by: Alvis Lemmings C on: 12/29/2018 04:30 PM   Modules accepted: Orders

## 2018-12-29 NOTE — Patient Instructions (Signed)
Medication Instructions:  Your physician has recommended you make the following change in your medication:   1) Decrease flecainide to 50 mg-take 1 tablet by mouth twice daily  If you need a refill on your cardiac medications before your next appointment, please call your pharmacy.   Lab work: None ordered  If you have labs (blood work) drawn today and your tests are completely normal, you will receive your results only by: [name] MyChart Message (if you have MyChart) OR . A paper copy in the mail If you have any lab test that is abnormal or we need to change your treatment, we will call you to review the results.  Testing/Procedures: None ordered  Follow-Up: At [facility], you and your health needs are our priority.  As part of our continuing mission to provide you with exceptional heart care, we have created designated Provider Care Teams.  These Care Teams include your primary Cardiologist (physician) and Advanced Practice Providers (APPs -  Physician Assistants and Nurse Practitioners) who all work together to provide you with the care you need, when you need it. . 6 weeks with Dr. [name]  Any Other Special Instructions Will Be Listed Below (If Applicable). - N/A

## 2018-12-30 ENCOUNTER — Telehealth: Payer: Self-pay | Admitting: Internal Medicine

## 2018-12-30 ENCOUNTER — Other Ambulatory Visit: Payer: Self-pay | Admitting: Nurse Practitioner

## 2018-12-30 DIAGNOSIS — F411 Generalized anxiety disorder: Secondary | ICD-10-CM

## 2018-12-30 NOTE — Telephone Encounter (Signed)
Patient calling  Requesting to speak [name], states she has some information from Dr [name] Did not wish to schedule until she has been able to speak with nurse Note from Nira Conn indicates patient needs a 6 week f/u: OK to use: one of the device slots on Tuesday 6/2.  If that doesn't work for her- 6/16 at 8:45 am

## 2019-01-01 DIAGNOSIS — J449 Chronic obstructive pulmonary disease, unspecified: Secondary | ICD-10-CM | POA: Diagnosis not present

## 2019-01-02 ENCOUNTER — Other Ambulatory Visit: Payer: Self-pay

## 2019-01-02 ENCOUNTER — Encounter: Payer: Self-pay | Admitting: Nurse Practitioner

## 2019-01-02 ENCOUNTER — Ambulatory Visit: Payer: Medicare Other | Admitting: Nurse Practitioner

## 2019-01-02 VITALS — BP 161/88 | HR 72 | Ht 60.0 in | Wt 185.0 lb

## 2019-01-02 DIAGNOSIS — F411 Generalized anxiety disorder: Secondary | ICD-10-CM

## 2019-01-02 DIAGNOSIS — I10 Essential (primary) hypertension: Secondary | ICD-10-CM | POA: Diagnosis not present

## 2019-01-02 DIAGNOSIS — J449 Chronic obstructive pulmonary disease, unspecified: Secondary | ICD-10-CM

## 2019-01-02 DIAGNOSIS — Z9981 Dependence on supplemental oxygen: Secondary | ICD-10-CM | POA: Diagnosis not present

## 2019-01-02 MED ORDER — ALPRAZOLAM 0.25 MG PO TABS: 0.5000 mg | ORAL_TABLET | Freq: Two times a day (BID) | ORAL | 3 refills | Status: DC | PRN

## 2019-01-02 NOTE — Telephone Encounter (Signed)
Attempted to contact patient LMOM .

## 2019-01-02 NOTE — Progress Notes (Signed)
[facility] [place], [place] [address]  Internal MEDICINE  Telephone Visit  Patient Name: [name]  [id number]  [id number]  Date of Service: 01/21/2019  I connected with the patient at 4:31 pm by telephone and verified the patients identity using two identifiers.   I discussed the limitations, risks, security and privacy concerns of performing an evaluation and management service by telephone and the availability of in person appointments. I also discussed with the patient that there may be a patient responsible charge related to the service.  The patient expressed understanding and agrees to proceed.    Chief Complaint  Patient presents with  . Telephone Screen    Phone visit  . Telephone Assessment  . Medical Management of Chronic Issues    follow up medication refill,     The patient has been contacted via telephone for follow up visit due to concerns for spread of novel coronavirus. She states she continues to have moderate anxiety. It is more severe now with COVID 19. She continues to take cymbalta 60mg  daily. She takes alprazolam 0.25mg  twice daily if needed for acute anxiety. She needs to have a refill for this today.       Current Medication: Outpatient Encounter Medications as of 01/02/2019  Medication Sig  . acetaminophen (TYLENOL) 500 MG tablet Take 500 mg by mouth every 4 (four) hours as needed for moderate pain or headache.   . albuterol (PROVENTIL HFA;VENTOLIN HFA) 108 (90 Base) MCG/ACT inhaler Inhale 2 puffs into the lungs every 6 (six) hours as needed for wheezing or shortness of breath.  . ALPRAZolam (XANAX) 0.25 MG tablet Take 2 tablets (0.5 mg total) by mouth 2 (two) times daily as needed for anxiety.  [name] amoxicillin (AMOXIL) 500 MG capsule Take 4 tablets po one time, one hour prior to dental procedure. (Patient not taking: Reported on 01/17/2019)  . apixaban (ELIQUIS) 5 MG TABS tablet Take 1 tablet (5 mg total) by mouth 2 (two) times  daily.  . Artificial Tear Solution (GENTEAL TEARS) 0.1-0.2-0.3 % SOLN Place 1 drop into both eyes daily.  [name] atorvastatin (LIPITOR) 40 MG tablet 1 tab po qhs  . Coenzyme Q10 (COQ10) 200 MG CAPS Take 200 mg by mouth daily.  . diclofenac sodium (VOLTAREN) 1 % GEL To the affected joint (Patient taking differently: Apply 2-4 g topically 3 (three) times daily as needed (shoulder pain.). To the affected joint )  . diltiazem (CARDIZEM CD) 120 MG 24 hr capsule Take 1 capsule (120 mg total) by mouth daily.  . DULoxetine (CYMBALTA) 60 MG capsule Take 1 capsule (60 mg total) by mouth daily.  . ferrous sulfate (FERROUSUL) 325 (65 FE) MG tablet Take 650 mg by mouth daily with breakfast.   . flecainide (TAMBOCOR) 50 MG tablet Take 1 tablet (50 mg total) by mouth 2 (two) times daily.  . hydrALAZINE (APRESOLINE) 25 MG tablet Take 1 tablet (25 mg total) by mouth 2 (two) times daily.  [name] lisinopril-hydrochlorothiazide (PRINZIDE,ZESTORETIC) 20-25 MG tablet Take 1 tablet by mouth daily.  [name] loratadine (CLARITIN) 10 MG tablet Take 10 mg by mouth daily as needed for allergies.   . Naphazoline-Pheniramine (OPCON-A) 0.027-0.315 % SOLN Place 1 drop into both eyes daily.  . OXYGEN Inhale into the lungs.  . potassium chloride SA (K-DUR,KLOR-CON) 20 MEQ tablet Take 1 tablet (20 mEq total) by mouth 2 (two) times daily.  . sodium chloride (OCEAN) 0.65 % SOLN nasal spray Place 1 spray into both nostrils  daily as needed for congestion.  . Tiotropium Bromide-Olodaterol (STIOLTO RESPIMAT) 2.5-2.5 MCG/ACT AERS Inhale 1 puff into the lungs daily.  . traZODone (DESYREL) 50 MG tablet Take 1 tablet (50 mg total) by mouth at bedtime.  . [DISCONTINUED] ALPRAZolam (XANAX) 0.25 MG tablet Take 2 tablets (0.5 mg total) by mouth at bedtime as needed.  . [DISCONTINUED] levothyroxine (SYNTHROID, LEVOTHROID) 25 MCG tablet Take 1 tablet (25 mcg total) by mouth daily before breakfast.  . [DISCONTINUED] pantoprazole (PROTONIX) 40 MG tablet Take 1  tablet (40 mg total) by mouth daily.   No facility-administered encounter medications on file as of 01/02/2019.     Surgical History: Past Surgical History:  Procedure Laterality Date  . ABDOMINAL AORTIC ANEURYSM REPAIR  2008  . ABDOMINAL HYSTERECTOMY    . AORTIC VALVE REPLACEMENT  2008  . CARDIAC CATHETERIZATION     [facility]  . CARDIOVERSION N/A 08/15/2018   Procedure: CARDIOVERSION;  Surgeon: [name], MD;  Location: [facility] ORS;  Service: Cardiovascular;  Laterality: N/A;  . CARDIOVERSION N/A 11/14/2018   Procedure: CARDIOVERSION (CATH LAB);  Surgeon: [name], MD;  Location: [facility] ORS;  Service: Cardiovascular;  Laterality: N/A;  . CARPAL TUNNEL RELEASE    . ELECTROPHYSIOLOGIC STUDY N/A 08/17/2016   Procedure: Cardioversion;  Surgeon: [name], MD;  Location: [facility] ORS;  Service: Cardiovascular;  Laterality: N/A;  . TUMOR EXCISION Left    x3 (arm)    Medical History: Past Medical History:  Diagnosis Date  . 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 s/p bioprosthetic valve replacement 2008 at [facility];  b. 01/2015 Echo: EF 60-65%, no rwma, Gr1 DD, mildly dil LA, nl RV fxn.  . Atrial fibrillation with RVR ([place]) 01/11/2015  . Atrial flutter ([place])    a. 08/2016 s/p DCCV.  Remains on flecainide 50 mg bid.  . Basal skull fracture ([place]) 20 yrs ago  . Chronic respiratory failure ([place])   . COPD (chronic obstructive pulmonary disease) ([place])    a. on home O2 at 2L since 2008  . 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 Essential hypertension 01/24/2015  . History of cardiac cath    a. 2008 prior to Aortic aneurysm repair-->nl cors.  . History of stress test    a. 10/2015 MV: no ischemia/infarct.  [name] HLD (hyperlipidemia)   . HTN (hypertension)   . Hypothyroidism   . Obesity   . PAF (paroxysmal atrial fibrillation) ([name])    a. on Eliquis; b. CHADS2VASc = 3 (HTN, age x 1, female).  . Paroxysmal atrial fibrillation ([place]) 01/24/2015  . Right upper  quadrant abdominal tenderness without rebound tenderness 03/02/2018  . S/P ascending aortic aneurysm repair 2008    Family History: Family History  Problem Relation Age of Onset  . Stroke Father   . Stroke Paternal Grandmother     Social History   Socioeconomic History  . Marital status: Single    Spouse name: Not on file  . Number of children: Not on file  . Years of education: Not on file  . Highest education level: Not on file  Occupational History  . Not on file  Social Needs  . Financial resource strain: Not on file  . Food insecurity:    Worry: Not on file    Inability: Not on file  . Transportation needs:    Medical: Not on file    Non-medical: Not on file  Tobacco  Use  . Smoking status: Former Smoker  . Smokeless tobacco: Never Used  Substance and Sexual Activity  . Alcohol use: No  . Drug use: No  . Sexual activity: Never    Birth control/protection: Surgical  Lifestyle  . Physical activity:    Days per week: Not on file    Minutes per session: Not on file  . Stress: Not on file  Relationships  . Social connections:    Talks on phone: Not on file    Gets together: Not on file    Attends religious service: Not on file    Active member of club or organization: Not on file    Attends meetings of clubs or organizations: Not on file    Relationship status: Not on file  . Intimate partner violence:    Fear of current or ex partner: Not on file    Emotionally abused: Not on file    Physically abused: Not on file    Forced sexual activity: Not on file  Other Topics Concern  . Not on file  Social History Narrative  . Not on file      Review of Systems  Constitutional: Positive for fatigue. Negative for chills and unexpected weight change.  HENT: Negative for congestion, postnasal drip, rhinorrhea, sneezing and sore throat.   Eyes: Negative for redness.  Respiratory: Positive for shortness of breath and wheezing. Negative for cough and chest tightness.    Cardiovascular: Positive for palpitations. Negative for chest pain.       Elevated blood pressure.   Gastrointestinal: Negative for abdominal pain, constipation, diarrhea, nausea and vomiting.  Endocrine:       Well controlled thyroid disease.   Musculoskeletal: Negative for arthralgias, back pain, joint swelling and neck pain.  Skin: Negative for rash.  Allergic/Immunologic: Positive for environmental allergies.  Neurological: Negative.  Negative for tremors and numbness.  Hematological: Negative for adenopathy. Does not bruise/bleed easily.  Psychiatric/Behavioral: Positive for dysphoric mood. Negative for behavioral problems (Depression), sleep disturbance and suicidal ideas. The patient is nervous/anxious.     Today's Vitals   01/02/19 1440  BP: (!) 161/88  Pulse: 72  Weight: 185 lb (83.9 kg)  Height: 5' (1.524 m)   Body mass index is 36.13 kg/m.  Observation/Objective:   The patient is alert and oriented. She is pleasant and answers all questions appropriately. Breathing is non-labored. She is in no acute distress at this time. She is using nasal cannula oxygen at all times. She appears anxious. She is at her baseline.    Assessment/Plan:  1. Essential hypertension Generally stable. Continue bp medication as prescribed and regualr visits with cardiology as scheduled   2. Chronic obstructive pulmonary disease, unspecified COPD type ([place]) Stable. Continue inhalers and respiratory medication as prescribed   3. Oxygen dependent Continue regular visits with oxygen at 2lpm  4. GAD (generalized anxiety disorder) cymbalta 60mg  daily. May use alprazolam 0.25mg  twice daily as needed for acute anxiety. New prescription sent to her pharmacy.  - ALPRAZolam (XANAX) 0.25 MG tablet; Take 2 tablets (0.5 mg total) by mouth 2 (two) times daily as needed for anxiety.  Dispense: 60 tablet; Refill: 3  General Counseling: [name] verbalizes understanding of the findings of today's phone  visit and agrees with plan of treatment. I have discussed any further diagnostic evaluation that may be needed or ordered today. We also reviewed her medications today. she has been encouraged to call the office with any questions or concerns that should arise related  to todays visit.  This patient was seen by [place] with Dr [name] as a part of collaborative care agreement  Meds ordered this encounter  Medications  . ALPRAZolam (XANAX) 0.25 MG tablet    Sig: Take 2 tablets (0.5 mg total) by mouth 2 (two) times daily as needed for anxiety.    Dispense:  60 tablet    Refill:  3    Order Specific Question:   Supervising Provider    Answer:   [name] [[id number]]    Time spent: 19 Minutes    Dr [name] Internal medicine

## 2019-01-02 NOTE — Progress Notes (Signed)
Pt blood pressure elevated, had switched to taking it at night and now stating that she is going to take it in the morning.  Informed provider

## 2019-01-04 ENCOUNTER — Other Ambulatory Visit: Payer: Self-pay

## 2019-01-04 MED ORDER — LEVOTHYROXINE SODIUM 25 MCG PO TABS: 25.0000 ug | ORAL_TABLET | Freq: Every day | ORAL | 1 refills | Status: DC

## 2019-01-04 MED ORDER — PANTOPRAZOLE SODIUM 40 MG PO TBEC: 40.0000 mg | DELAYED_RELEASE_TABLET | Freq: Every day | ORAL | 1 refills | Status: DC

## 2019-01-09 ENCOUNTER — Telehealth: Payer: Self-pay

## 2019-01-09 NOTE — Telephone Encounter (Signed)
Pt called for samples for stiolto 2.5 and pt advised we have ready for pickup

## 2019-01-17 ENCOUNTER — Ambulatory Visit: Payer: Medicare Other | Admitting: Internal Medicine

## 2019-01-17 ENCOUNTER — Encounter: Payer: Self-pay | Admitting: Internal Medicine

## 2019-01-17 ENCOUNTER — Other Ambulatory Visit: Payer: Self-pay

## 2019-01-17 VITALS — BP 140/86 | HR 84 | Resp 18 | Ht <= 58 in | Wt 189.0 lb

## 2019-01-17 DIAGNOSIS — Z9981 Dependence on supplemental oxygen: Secondary | ICD-10-CM

## 2019-01-17 DIAGNOSIS — R0602 Shortness of breath: Secondary | ICD-10-CM

## 2019-01-17 DIAGNOSIS — Z9989 Dependence on other enabling machines and devices: Secondary | ICD-10-CM

## 2019-01-17 DIAGNOSIS — G4733 Obstructive sleep apnea (adult) (pediatric): Secondary | ICD-10-CM

## 2019-01-17 DIAGNOSIS — J9611 Chronic respiratory failure with hypoxia: Secondary | ICD-10-CM

## 2019-01-17 NOTE — Progress Notes (Signed)
[facility] [place], [place] [address]  Pulmonary Sleep Medicine   Office Visit Note  Patient Name: [name] DOB: [birth year] MRN [id number]  Date of Service: 01/17/2019  Complaints/HPI: Pt is here for 3 month follow up.  She denies any current or recent issues.  She has been wearing her cpap daily with no difficulty.  She is cleaning her machine and replacing her mask, seal and tubing as instructed.  She denies any mask leaks.  She continues to also wear her oxygen continually.  She uses 3 lpm when at home, and 2lpm when using her portable because it can not deliver anything higher.    ROS  General: (-) fever, (-) chills, (-) night sweats, (-) weakness Skin: (-) rashes, (-) itching,. Eyes: (-) visual changes, (-) redness, (-) itching. Nose and Sinuses: (-) nasal stuffiness or itchiness, (-) postnasal drip, (-) nosebleeds, (-) sinus trouble. Mouth and Throat: (-) sore throat, (-) hoarseness. Neck: (-) swollen glands, (-) enlarged thyroid, (-) neck pain. Respiratory: + cough, (-) bloody sputum, + shortness of breath, - wheezing. Cardiovascular: - ankle swelling, (-) chest pain. Lymphatic: (-) lymph node enlargement. Neurologic: (-) numbness, (-) tingling. Psychiatric: (-) anxiety, (-) depression   Current Medication: Outpatient Encounter Medications as of 01/17/2019  Medication Sig  . acetaminophen (TYLENOL) 500 MG tablet Take 500 mg by mouth every 4 (four) hours as needed for moderate pain or headache.   . albuterol (PROVENTIL HFA;VENTOLIN HFA) 108 (90 Base) MCG/ACT inhaler Inhale 2 puffs into the lungs every 6 (six) hours as needed for wheezing or shortness of breath.  . ALPRAZolam (XANAX) 0.25 MG tablet Take 2 tablets (0.5 mg total) by mouth 2 (two) times daily as needed for anxiety.  [name] amoxicillin (AMOXIL) 500 MG capsule Take 4 tablets po one time, one hour prior to dental procedure. (Patient not taking: Reported on 01/17/2019)  . apixaban  (ELIQUIS) 5 MG TABS tablet Take 1 tablet (5 mg total) by mouth 2 (two) times daily.  . Artificial Tear Solution (GENTEAL TEARS) 0.1-0.2-0.3 % SOLN Place 1 drop into both eyes daily.  [name] atorvastatin (LIPITOR) 40 MG tablet 1 tab po qhs  . Coenzyme Q10 (COQ10) 200 MG CAPS Take 200 mg by mouth daily.  . diclofenac sodium (VOLTAREN) 1 % GEL To the affected joint (Patient taking differently: Apply 2-4 g topically 3 (three) times daily as needed (shoulder pain.). To the affected joint )  . diltiazem (CARDIZEM CD) 120 MG 24 hr capsule Take 1 capsule (120 mg total) by mouth daily.  . DULoxetine (CYMBALTA) 60 MG capsule Take 1 capsule (60 mg total) by mouth daily.  . ferrous sulfate (FERROUSUL) 325 (65 FE) MG tablet Take 650 mg by mouth daily with breakfast.   . flecainide (TAMBOCOR) 50 MG tablet Take 1 tablet (50 mg total) by mouth 2 (two) times daily.  . hydrALAZINE (APRESOLINE) 25 MG tablet Take 1 tablet (25 mg total) by mouth 2 (two) times daily.  [name] levothyroxine (SYNTHROID) 25 MCG tablet Take 1 tablet (25 mcg total) by mouth daily before breakfast.  . lisinopril-hydrochlorothiazide (PRINZIDE,ZESTORETIC) 20-25 MG tablet Take 1 tablet by mouth daily.  [name] loratadine (CLARITIN) 10 MG tablet Take 10 mg by mouth daily as needed for allergies.   . Naphazoline-Pheniramine (OPCON-A) 0.027-0.315 % SOLN Place 1 drop into both eyes daily.  . OXYGEN Inhale into the lungs.  . pantoprazole (PROTONIX) 40 MG tablet Take 1 tablet (40 mg total) by mouth daily.  . potassium  chloride SA (K-DUR,KLOR-CON) 20 MEQ tablet Take 1 tablet (20 mEq total) by mouth 2 (two) times daily.  . sodium chloride (OCEAN) 0.65 % SOLN nasal spray Place 1 spray into both nostrils daily as needed for congestion.  . Tiotropium Bromide-Olodaterol (STIOLTO RESPIMAT) 2.5-2.5 MCG/ACT AERS Inhale 1 puff into the lungs daily.  . traZODone (DESYREL) 50 MG tablet Take 1 tablet (50 mg total) by mouth at bedtime.   No facility-administered encounter  medications on file as of 01/17/2019.     Surgical History: Past Surgical History:  Procedure Laterality Date  . ABDOMINAL AORTIC ANEURYSM REPAIR  2008  . ABDOMINAL HYSTERECTOMY    . AORTIC VALVE REPLACEMENT  2008  . CARDIAC CATHETERIZATION     [facility]  . CARDIOVERSION N/A 08/15/2018   Procedure: CARDIOVERSION;  Surgeon: [name], MD;  Location: [facility] ORS;  Service: Cardiovascular;  Laterality: N/A;  . CARDIOVERSION N/A 11/14/2018   Procedure: CARDIOVERSION (CATH LAB);  Surgeon: [name], MD;  Location: [facility] ORS;  Service: Cardiovascular;  Laterality: N/A;  . CARPAL TUNNEL RELEASE    . ELECTROPHYSIOLOGIC STUDY N/A 08/17/2016   Procedure: Cardioversion;  Surgeon: [name], MD;  Location: [facility] ORS;  Service: Cardiovascular;  Laterality: N/A;  . TUMOR EXCISION Left    x3 (arm)    Medical History: Past Medical History:  Diagnosis Date  . 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 s/p bioprosthetic valve replacement 2008 at [facility];  b. 01/2015 Echo: EF 60-65%, no rwma, Gr1 DD, mildly dil LA, nl RV fxn.  . Atrial fibrillation with RVR ([place]) 01/11/2015  . Atrial flutter ([place])    a. 08/2016 s/p DCCV.  Remains on flecainide 50 mg bid.  . Basal skull fracture ([place]) 20 yrs ago  . Chronic respiratory failure ([place])   . COPD (chronic obstructive pulmonary disease) ([place])    a. on home O2 at 2L since 2008  . 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 Essential hypertension 01/24/2015  . History of cardiac cath    a. 2008 prior to Aortic aneurysm repair-->nl cors.  . History of stress test    a. 10/2015 MV: no ischemia/infarct.  [name] HLD (hyperlipidemia)   . HTN (hypertension)   . Hypothyroidism   . Obesity   . PAF (paroxysmal atrial fibrillation) ([name])    a. on Eliquis; b. CHADS2VASc = 3 (HTN, age x 1, female).  . Paroxysmal atrial fibrillation ([place]) 01/24/2015  . Right upper quadrant abdominal tenderness without rebound tenderness 03/02/2018  . S/P  ascending aortic aneurysm repair 2008    Family History: Family History  Problem Relation Age of Onset  . Stroke Father   . Stroke Paternal Grandmother     Social History: Social History   Socioeconomic History  . Marital status: Single    Spouse name: Not on file  . Number of children: Not on file  . Years of education: Not on file  . Highest education level: Not on file  Occupational History  . Not on file  Social Needs  . Financial resource strain: Not on file  . Food insecurity:    Worry: Not on file    Inability: Not on file  . Transportation needs:    Medical: Not on file    Non-medical: Not on file  Tobacco Use  . Smoking status: Former Research scientist (life sciences)  . Smokeless tobacco: Never Used  Substance and Sexual Activity  . Alcohol  use: No  . Drug use: No  . Sexual activity: Never    Birth control/protection: Surgical  Lifestyle  . Physical activity:    Days per week: Not on file    Minutes per session: Not on file  . Stress: Not on file  Relationships  . Social connections:    Talks on phone: Not on file    Gets together: Not on file    Attends religious service: Not on file    Active member of club or organization: Not on file    Attends meetings of clubs or organizations: Not on file    Relationship status: Not on file  . Intimate partner violence:    Fear of current or ex partner: Not on file    Emotionally abused: Not on file    Physically abused: Not on file    Forced sexual activity: Not on file  Other Topics Concern  . Not on file  Social History Narrative  . Not on file    Vital Signs: Blood pressure 140/86, pulse 84, resp. rate 18, height 4\' 10"  (1.473 m), weight 189 lb (85.7 kg), SpO2 96 %.  Examination: General Appearance: The patient is well-developed, well-nourished, and in no distress. Skin: Gross inspection of skin unremarkable. Head: normocephalic, no gross deformities. Eyes: no gross deformities noted. ENT: ears appear grossly normal no  exudates. Neck: Supple. No thyromegaly. No LAD. Respiratory: clear, with diminished breath sounds in bases. Cardiovascular: Normal S1 and S2 without murmur or rub. Extremities: No cyanosis. pulses are equal. Neurologic: Alert and oriented. No involuntary movements.  LABS: Recent Results (from the past 2160 hour(s))  Basic metabolic panel     Status: Abnormal   Collection Time: 10/31/18 11:44 AM  Result Value Ref Range   Sodium 134 (L) 135 - 145 mmol/L   Potassium 3.5 3.5 - 5.1 mmol/L   Chloride 100 98 - 111 mmol/L   CO2 26 22 - 32 mmol/L   Glucose, Bld 107 (H) 70 - 99 mg/dL   BUN 10 8 - 23 mg/dL   Creatinine, Ser 0.61 0.44 - 1.00 mg/dL   Calcium 9.0 8.9 - 10.3 mg/dL   GFR calc non Af Amer >60 >60 mL/min   GFR calc Af Amer >60 >60 mL/min   Anion gap 8 5 - 15    Comment: Performed at [facility], [address] [place], [place] [address]  CBC     Status: Abnormal   Collection Time: 10/31/18 11:44 AM  Result Value Ref Range   WBC 5.5 4.0 - 10.5 K/uL   RBC 4.47 3.87 - 5.11 MIL/uL   Hemoglobin 12.2 12.0 - 15.0 g/dL   HCT 38.2 36.0 - 46.0 %   MCV 85.5 80.0 - 100.0 fL   MCH 27.3 26.0 - 34.0 pg   MCHC 31.9 30.0 - 36.0 g/dL   RDW 16.5 (H) 11.5 - 15.5 %   Platelets 319 150 - 400 K/uL   nRBC 0.0 0.0 - 0.2 %    Comment: Performed at [facility], [address] [place], [place] [address]  Troponin I - ONCE - STAT     Status: None   Collection Time: 10/31/18 11:44 AM  Result Value Ref Range   Troponin I <0.03 <0.03 ng/mL    Comment: Performed at [facility], [address] [place], [place] [address]  TSH     Status: None   Collection Time: 10/31/18 11:44 AM  Result Value Ref Range  TSH 1.755 0.350 - 4.500 uIU/mL    Comment: Performed by a 3rd Generation assay with a functional sensitivity of <=0.01 uIU/mL. Performed at [facility], [address] [place], [place] [address]   Magnesium     Status: None   Collection Time: 10/31/18  11:44 AM  Result Value Ref Range   Magnesium 2.0 1.7 - 2.4 mg/dL    Comment: Performed at [facility], [address] [place], [place] [address]  Troponin I - Once-Timed     Status: None   Collection Time: 10/31/18  2:07 PM  Result Value Ref Range   Troponin I <0.03 <0.03 ng/mL    Comment: Performed at [facility], [address], [place], [place] [address]    Radiology: No results found.  No results found.  No results found.    Assessment and Plan: Patient Active Problem List   Diagnosis Date Noted  . Paroxysmal atrial flutter ([place]) 08/11/2018  . Encounter for general adult medical examination with abnormal findings 07/09/2018  . Chronic left shoulder pain 07/09/2018  . Conjunctivitis 07/09/2018  . Oxygen dependent 07/09/2018  . GAD (generalized anxiety disorder) 07/09/2018  . Ovarian failure 07/09/2018  . Positive colorectal cancer screening using Cologuard test 04/24/2018  . Calculus of gallbladder with acute on chronic cholecystitis 04/08/2018  . H/O: CVA (cerebrovascular accident) 04/08/2018  . Dysuria 03/02/2018  . Urinary tract infection without hematuria 03/02/2018  . Right upper quadrant abdominal tenderness without rebound tenderness 03/02/2018  . 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 exacerbation ([place]) 03/02/2018  . COPD (chronic obstructive pulmonary disease) ([place]) 09/19/2017  . Typical atrial flutter ([place])   . Irritable bowel syndrome without diarrhea 01/29/2015  . Essential hypertension 01/24/2015  . Paroxysmal atrial fibrillation ([place]) 01/24/2015  . History of aortic valve replacement with bioprosthetic valve 01/24/2015  . Atrial fibrillation with RVR ([place]) 01/11/2015  . 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 Impingement syndrome of shoulder 05/18/2014  . Cellulitis and abscess 02/13/2014  . MRSA (methicillin resistant Staphylococcus aureus) 02/13/2014  . Aneurysm, ascending aorta ([place]) 06/23/2013   . Bicuspid aortic valve 06/23/2013    1. OSA on CPAP Continue to use CPAP machine nightly and when napping.     2. Chronic respiratory failure with hypoxia ([name]) Continue to use oxygen as prescribed.   3. Oxygen dependent Pt uses 3 liters at home and only 2 liters when out because of limitations with her portable concentrator.   4. SOB (shortness of breath) FVC is 1.6 which is 70% of the predicted value FEV1 is 1.1 which is 66% of predicted value FEV1/FVC is 71% which is 93% of the predicted value on todays spirometry - Spirometry with Graph  General Counseling: I have discussed the findings of the evaluation and examination with Shelonda.  I have also discussed any further diagnostic evaluation thatmay be needed or ordered today. Javaya verbalizes understanding of the findings of todays visit. We also reviewed her medications today and discussed drug interactions and side effects including but not limited excessive drowsiness and altered mental states. We also discussed that there is always a risk not just to her but also people around her. she has been encouraged to call the office with any questions or concerns that should arise related to todays visit.    Time spent: 20 This patient was seen by Orson Gear AGNP-C in Collaboration with Dr. Devona Konig as a part of collaborative care agreement.   I have personally obtained a history,  examined the patient, evaluated laboratory and imaging results, formulated the assessment and plan and placed orders.    Allyne Gee, MD [facility] Pulmonary and Critical Care Sleep medicine

## 2019-01-17 NOTE — Patient Instructions (Signed)

## 2019-01-19 DIAGNOSIS — H1013 Acute atopic conjunctivitis, bilateral: Secondary | ICD-10-CM | POA: Diagnosis not present

## 2019-01-20 ENCOUNTER — Other Ambulatory Visit: Payer: Self-pay

## 2019-01-31 DIAGNOSIS — J449 Chronic obstructive pulmonary disease, unspecified: Secondary | ICD-10-CM | POA: Diagnosis not present

## 2019-02-03 DIAGNOSIS — M19012 Primary osteoarthritis, left shoulder: Secondary | ICD-10-CM | POA: Diagnosis not present

## 2019-02-07 ENCOUNTER — Other Ambulatory Visit: Payer: Self-pay

## 2019-02-07 ENCOUNTER — Telehealth: Payer: Medicare Other | Admitting: Internal Medicine

## 2019-02-07 ENCOUNTER — Telehealth: Payer: Self-pay | Admitting: Internal Medicine

## 2019-02-07 NOTE — Telephone Encounter (Signed)
Dr. [name],  Can you look at her chart- I had on her AVS to follow up in 6 weeks, but your note from her e-visit on 12/29/18 said PRN.  I think we are going to have to see her in office at least once as she gave the front desk a fit today.  OK to push out an in office visit to mid to late July with you?

## 2019-02-07 NOTE — Telephone Encounter (Signed)
Patient came by office  Was very upset about the process of getting in the hospital and claimed no one called her about appointment Patient was informed that we called and spoke with her to switch to a virtual visit and that she was scheduled to speak with him at 12pm Patient stated she does not want another virtual visit, would like to see Dr [name] in office Please advise on when to best schedule patient for an in office visit

## 2019-02-17 ENCOUNTER — Other Ambulatory Visit: Payer: Self-pay

## 2019-02-17 NOTE — Telephone Encounter (Signed)
m Given her COPD she is not a candidate for ablation Do you want me to continue to see her, or would you like to follow her; glad to do whatever you prefer sk

## 2019-02-20 NOTE — Telephone Encounter (Signed)
Given that she is not a candidate for ablation, no need to see Dr. [name].  She can continue follow-up with me as I have known her for a long time. Thanks.

## 2019-02-20 NOTE — Telephone Encounter (Signed)
Dr. Fletcher Anon, when would you want to see her back?

## 2019-02-21 NOTE — Telephone Encounter (Signed)
In 2 months

## 2019-02-23 ENCOUNTER — Telehealth: Payer: Self-pay | Admitting: Internal Medicine

## 2019-02-23 NOTE — Telephone Encounter (Signed)
I spoke with the patient. She is aware that Dr. Fletcher Anon and Dr. [name] have spoken and they feel that since she is not a candidate for ablation, she would be best served by following with Dr. Fletcher Anon.  I have advised her Dr. Fletcher Anon would like to see her in 2 months (August), but that schedule is not available yet.  She has been advised to call back the week of 6/29 to see if this is available.  She is agreeable.

## 2019-02-23 NOTE — Telephone Encounter (Signed)
Pt c/o BP issue: STAT if pt c/o blurred vision, one-sided weakness or slurred speech  1. What are your last 5 BP readings?  6/16- 169/93 HR 70 6/17- 179/94 HR 69 evening, 167/91 HR 72 This morning - 172/89 HR 72  2. Are you having any other symptoms (ex. Dizziness, headache, blurred vision, passed out)? Left side of head and neck is hurting badley. Patient has taken tylenol, and pain subsides only for aboutr 1 1/2 hours  3. What is your BP issue? elevated

## 2019-02-23 NOTE — Telephone Encounter (Signed)
I spoke with the patient. She states she is doing better this afternoon with her BP. She has been having some neck pain due to osteoarthritis. She states her daughter came over and massaged her neck as she felt "knots" in it and applied some cream that she has to use.  Her neck is now feeling better and her BP this afternoon is down to 134/69.  She states she did get upset about something last night as well, but the BP readings since the 16th have been higher than her normal, but again, she feels this is due to her pain.  I have advised her to continue to monitor her BP and is her numbers increase in the setting of no emotional stress/ pain, she should call and let us know.   She voices understanding and is agreeable.

## 2019-02-25 ENCOUNTER — Emergency Department: Payer: Medicare Other

## 2019-02-25 ENCOUNTER — Other Ambulatory Visit: Payer: Self-pay

## 2019-02-25 ENCOUNTER — Emergency Department
Admission: EM | Admit: 2019-02-25 | Discharge: 2019-02-26 | Disposition: A | Discharge status: Home / Self Care | Payer: Medicare Other | Attending: Emergency Medicine | Admitting: Emergency Medicine

## 2019-02-25 DIAGNOSIS — Z952 Presence of prosthetic heart valve: Secondary | ICD-10-CM | POA: Diagnosis not present

## 2019-02-25 DIAGNOSIS — Z7901 Long term (current) use of anticoagulants: Secondary | ICD-10-CM | POA: Insufficient documentation

## 2019-02-25 DIAGNOSIS — J449 Chronic obstructive pulmonary disease, unspecified: Secondary | ICD-10-CM | POA: Insufficient documentation

## 2019-02-25 DIAGNOSIS — M542 Cervicalgia: Secondary | ICD-10-CM | POA: Diagnosis not present

## 2019-02-25 DIAGNOSIS — Z79899 Other long term (current) drug therapy: Secondary | ICD-10-CM | POA: Insufficient documentation

## 2019-02-25 DIAGNOSIS — Z85038 Personal history of other malignant neoplasm of large intestine: Secondary | ICD-10-CM | POA: Insufficient documentation

## 2019-02-25 DIAGNOSIS — E039 Hypothyroidism, unspecified: Secondary | ICD-10-CM | POA: Insufficient documentation

## 2019-02-25 DIAGNOSIS — I10 Essential (primary) hypertension: Secondary | ICD-10-CM | POA: Insufficient documentation

## 2019-02-25 DIAGNOSIS — Z87891 Personal history of nicotine dependence: Secondary | ICD-10-CM | POA: Diagnosis not present

## 2019-02-25 MED ORDER — HYDROCODONE-ACETAMINOPHEN 5-325 MG PO TABS
1.0000 | ORAL_TABLET | ORAL | Status: AC
  Administered 2019-02-25: 1 via ORAL
  Filled 2019-02-25: qty 1

## 2019-02-25 MED ORDER — HYDROCODONE-ACETAMINOPHEN 5-325 MG PO TABS: 1.0000 | ORAL_TABLET | Freq: Four times a day (QID) | ORAL | 0 refills | Status: DC | PRN

## 2019-02-25 NOTE — ED Provider Notes (Signed)
I forgot to note the patient did speak to her orthopedic doctor service, she reports to be able to follow her up in the clinic this coming week.   Delman Kitten, MD 02/25/19 [phone]

## 2019-02-25 NOTE — ED Triage Notes (Addendum)
Pt arrived via POV with reports of neck pain at the base of the neck on the left side radiating to the shoulder, pt states she has been taking tylenol.  Pt describes the pain as a knot, pt states it is sore to the touch.

## 2019-02-25 NOTE — ED Provider Notes (Signed)
[facility] Emergency Department Provider Note ____________________________________________   First MD Initiated Contact with Patient 02/25/19 2222     (approximate)  I have reviewed the triage vital signs and the nursing notes.  HISTORY  Chief Complaint Neck Pain  HPI ODETH BRY is a 73 y.o. female here for evaluation of neck pain.  Patient reports for about 5 days now she is experiencing pain in her left lower neck, makes the muscles of her left lower neck feel like they are in spasm and occasionally lancinating pain down towards her left shoulder.  She is had no falls or injuries.  She has a history of shoulder pain knows that she has arthritis in a lot of spots.  Taking Tylenol but not improving.  No chest pain no shortness of breath.  Reports the pain is all located along the left side base of her neck radiates down the shoulder.  She is tried Tylenol to no relief or worse versus very severe pain.  She called her orthopedic doctor and they recommended that she come in early this week.  Had previous injections for similar pain reports the muscles feel very tight and spasm.  Past Medical History:  Diagnosis Date  . 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 s/p bioprosthetic valve replacement 2008 [facility];  b. 01/2015 Echo: EF 60-65%, no rwma, Gr1 DD, mildly dil LA, nl RV fxn.  . Atrial fibrillation with RVR ([place]) 01/11/2015  . Atrial flutter ([place])    a. 08/2016 s/p DCCV.  Remains on flecainide 50 mg bid.  . Basal skull fracture ([place]) 20 yrs ago  . Chronic respiratory failure ([place])   . COPD (chronic obstructive pulmonary disease) ([place])    a. on home O2 at 2L since 2008  . 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 Essential hypertension 01/24/2015  . History of cardiac cath    a. 2008 prior to Aortic aneurysm repair-->nl cors.  . History of stress test    a. 10/2015 MV: no ischemia/infarct.  [name] HLD (hyperlipidemia)   . HTN  (hypertension)   . Hypothyroidism   . Obesity   . PAF (paroxysmal atrial fibrillation) ([name])    a. on Eliquis; b. CHADS2VASc = 3 (HTN, age x 1, female).  . Paroxysmal atrial fibrillation ([place]) 01/24/2015  . Right upper quadrant abdominal tenderness without rebound tenderness 03/02/2018  . S/P ascending aortic aneurysm repair 2008    Patient Active Problem List   Diagnosis Date Noted  . Paroxysmal atrial flutter ([place]) 08/11/2018  . Encounter for general adult medical examination with abnormal findings 07/09/2018  . Chronic left shoulder pain 07/09/2018  . Conjunctivitis 07/09/2018  . Oxygen dependent 07/09/2018  . GAD (generalized anxiety disorder) 07/09/2018  . Ovarian failure 07/09/2018  . Positive colorectal cancer screening using Cologuard test 04/24/2018  . Calculus of gallbladder with acute on chronic cholecystitis 04/08/2018  . H/O: CVA (cerebrovascular accident) 04/08/2018  . Dysuria 03/02/2018  . Urinary tract infection without hematuria 03/02/2018  . Right upper quadrant abdominal tenderness without rebound tenderness 03/02/2018  . 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 exacerbation ([place]) 03/02/2018  . Chronic obstructive pulmonary disease ([place]) 09/19/2017  . Typical atrial flutter ([place])   . Irritable bowel syndrome without diarrhea 01/29/2015  . Essential hypertension 01/24/2015  . Paroxysmal atrial fibrillation ([place]) 01/24/2015  . History of aortic valve replacement with bioprosthetic valve 01/24/2015  . Atrial fibrillation with RVR ([place])  01/11/2015  . 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 Impingement syndrome of shoulder 05/18/2014  . Cellulitis and abscess 02/13/2014  . MRSA (methicillin resistant Staphylococcus aureus) 02/13/2014  . Aneurysm, ascending aorta ([place]) 06/23/2013  . Bicuspid aortic valve 06/23/2013    Past Surgical History:  Procedure Laterality Date  . ABDOMINAL AORTIC ANEURYSM REPAIR  2008  . ABDOMINAL  HYSTERECTOMY    . AORTIC VALVE REPLACEMENT  2008  . CARDIAC CATHETERIZATION     [facility]  . CARDIOVERSION N/A 08/15/2018   Procedure: CARDIOVERSION;  Surgeon: [name], MD;  Location: [facility] ORS;  Service: Cardiovascular;  Laterality: N/A;  . CARDIOVERSION N/A 11/14/2018   Procedure: CARDIOVERSION (CATH LAB);  Surgeon: [name], MD;  Location: [facility] ORS;  Service: Cardiovascular;  Laterality: N/A;  . CARPAL TUNNEL RELEASE    . ELECTROPHYSIOLOGIC STUDY N/A 08/17/2016   Procedure: Cardioversion;  Surgeon: [name], MD;  Location: [facility] ORS;  Service: Cardiovascular;  Laterality: N/A;  . TUMOR EXCISION Left    x3 (arm)    Prior to Admission medications   Medication Sig Start Date End Date Taking? Authorizing Provider  acetaminophen (TYLENOL) 500 MG tablet Take 500 mg by mouth every 4 (four) hours as needed for moderate pain or headache.     [provider]  albuterol (PROVENTIL HFA;VENTOLIN HFA) 108 (90 Base) MCG/ACT inhaler Inhale 2 puffs into the lungs every 6 (six) hours as needed for wheezing or shortness of breath. 11/29/18   Ronnell Freshwater, NP  ALPRAZolam (XANAX) 0.25 MG tablet Take 2 tablets (0.5 mg total) by mouth 2 (two) times daily as needed for anxiety. 01/02/19   Ronnell Freshwater, NP  amoxicillin (AMOXIL) 500 MG capsule Take 4 tablets po one time, one hour prior to dental procedure. Patient not taking: Reported on 01/17/2019 08/01/18   Ronnell Freshwater, NP  apixaban (ELIQUIS) 5 MG TABS tablet Take 1 tablet (5 mg total) by mouth 2 (two) times daily. 12/06/18   [name], MD  Artificial Tear Solution (GENTEAL TEARS) 0.1-0.2-0.3 % SOLN Place 1 drop into both eyes daily.    [provider]  atorvastatin (LIPITOR) 40 MG tablet 1 tab po qhs 11/07/18   Boscia, Heather E, NP  Coenzyme Q10 (COQ10) 200 MG CAPS Take 200 mg by mouth daily.    [provider]  diclofenac sodium (VOLTAREN) 1 % GEL To the affected joint Patient taking differently:  Apply 2-4 g topically 3 (three) times daily as needed (shoulder pain.). To the affected joint  09/17/17   Ronnell Freshwater, NP  diltiazem (CARDIZEM CD) 120 MG 24 hr capsule Take 1 capsule (120 mg total) by mouth daily. 11/30/18   [name], MD  DULoxetine (CYMBALTA) 60 MG capsule Take 1 capsule (60 mg total) by mouth daily. 10/27/18   Ronnell Freshwater, NP  ferrous sulfate (FERROUSUL) 325 (65 FE) MG tablet Take 650 mg by mouth daily with breakfast.     [provider]  flecainide (TAMBOCOR) 50 MG tablet Take 1 tablet (50 mg total) by mouth 2 (two) times daily. 12/29/18   Deboraha Sprang, MD  hydrALAZINE (APRESOLINE) 25 MG tablet Take 1 tablet (25 mg total) by mouth 2 (two) times daily. 12/06/18 03/06/19  [name], MD  HYDROcodone-acetaminophen (NORCO/VICODIN) 5-325 MG tablet Take 1 tablet by mouth every 6 (six) hours as needed for moderate pain. 02/25/19   Delman Kitten, MD  levothyroxine (SYNTHROID) 25 MCG tablet Take  1 tablet (25 mcg total) by mouth daily before breakfast. 01/04/19   Ronnell Freshwater, NP  lisinopril-hydrochlorothiazide (PRINZIDE,ZESTORETIC) 20-25 MG tablet Take 1 tablet by mouth daily. 05/10/18   [name], MD  loratadine (CLARITIN) 10 MG tablet Take 10 mg by mouth daily as needed for allergies.     [provider]  Naphazoline-Pheniramine (OPCON-A) 0.027-0.315 % SOLN Place 1 drop into both eyes daily.    [provider]  OXYGEN Inhale into the lungs.    [provider]  pantoprazole (PROTONIX) 40 MG tablet Take 1 tablet (40 mg total) by mouth daily. 01/04/19   Ronnell Freshwater, NP  potassium chloride SA (K-DUR,KLOR-CON) 20 MEQ tablet Take 1 tablet (20 mEq total) by mouth 2 (two) times daily. 12/09/18   [name], MD  sodium chloride (OCEAN) 0.65 % SOLN nasal spray Place 1 spray into both nostrils daily as needed for congestion.    [provider]  Tiotropium Bromide-Olodaterol (STIOLTO RESPIMAT) 2.5-2.5 MCG/ACT AERS  Inhale 1 puff into the lungs daily.    [provider]  traZODone (DESYREL) 50 MG tablet Take 1 tablet (50 mg total) by mouth at bedtime. 10/24/18   Ronnell Freshwater, NP    Allergies Benadryl [diphenhydramine hcl (sleep)], Cetirizine, Diphenhydramine-zinc acetate, Lasix [furosemide], Levaquin [levofloxacin in d5w], Levofloxacin, Meloxicam, Soy allergy, and Sulfa antibiotics  Family History  Problem Relation Age of Onset  . Stroke Father   . Stroke Paternal Grandmother     Social History Social History   Tobacco Use  . Smoking status: Former Research scientist (life sciences)  . Smokeless tobacco: Never Used  Substance Use Topics  . Alcohol use: No  . Drug use: No    Review of Systems Constitutional: No fever/chills Eyes: No visual changes. ENT: No sore throat.  Pain over the left lower neck, feels "tight muscles" in the left side of the neck.  Pain will occasionally shoot out a sharp pain towards the left shoulder from the base of the neck. Cardiovascular: Denies chest pain. Respiratory: Denies shortness of breath. Gastrointestinal: No abdominal pain.   Genitourinary: Negative for dysuria. Musculoskeletal: Negative for back pain. Skin: Negative for rash. Neurological: Negative for headaches, areas of focal weakness or numbness.    ____________________________________________   PHYSICAL EXAM:  VITAL SIGNS: ED Triage Vitals  Enc Vitals Group     BP 02/25/19 1841 (!) 179/68     Pulse Rate 02/25/19 1841 81     Resp 02/25/19 1841 20     Temp 02/25/19 1841 98.5 F (36.9 C)     Temp Source 02/25/19 1841 Oral     SpO2 02/25/19 1841 96 %     Weight 02/25/19 1841 185 lb (83.9 kg)     Height 02/25/19 1841 4\' 10"  (1.473 m)     Head Circumference --      Peak Flow --      Pain Score 02/25/19 1839 10     Pain Loc --      Pain Edu? --      Excl. in [place]? --     Constitutional: Alert and oriented. Well appearing and in no acute distress. Eyes: Conjunctivae are normal. Head: Atraumatic.  Nose: No congestion/rhinnorhea. Mouth/Throat: Mucous membranes are moist. Neck: No stridor.  Tenderness especially of the paraspinous region in the lower cervical spine without deformity.  She reports a radiating pain and feels like there is a tenseness to the muscles the base of the left neck.  There is no overlying  lesion.  Good range of motion of the shoulder without inducing pain.  Does have tenderness to the mid to lower cervical spine. Cardiovascular: Normal rate, regular rhythm. Grossly normal heart sounds.  Good peripheral circulation. Respiratory: Normal respiratory effort.  No retractions. Lungs CTAB.  Uses home oxygen, on a normal amount. Gastrointestinal: Soft and nontender. No distention. Neurologic:  Normal speech and language. No gross focal neurologic deficits are appreciated.  Use of upper extremities without difficulty, normal strength upper extremities bilateral. Skin:  Skin is warm, dry and intact. No rash noted. Psychiatric: Mood and affect are normal. Speech and behavior are normal.  ____________________________________________   LABS (all labs ordered are listed, but only abnormal results are displayed)  Labs Reviewed - No data to display ____________________________________________  EKG   ____________________________________________  RADIOLOGY  Dg Cervical Spine 2-3 Views  Result Date: 02/25/2019 CLINICAL DATA:  Posterior neck pain EXAM: CERVICAL SPINE - 2-3 VIEW COMPARISON:  04/23/2014 FINDINGS: Dens and lateral masses are within normal limits. Straightening of the cervical spine. Prominent degenerative changes C4-C5, C5-C6 and C6-C7. IMPRESSION: Straightening of the cervical spine with marked degenerative change C4 through C7. Electronically Signed   By: Donavan Foil M.D.   On: 02/25/2019 20:03    Notable degenerative changes lower cervical spine. ____________________________________________   PROCEDURES  Procedure(s) performed: None  Procedures   Critical Care performed: No  ____________________________________________   INITIAL IMPRESSION / ASSESSMENT AND PLAN / ED COURSE  Pertinent labs & imaging results that were available during my care of the patient were reviewed by me and considered in my medical decision making (see chart for details).    Several days left-sided neck pain without associated cardiac or pulmonary symptoms.  No acute neurologic symptoms except appears most consistent with radicular lower cervical neck pain.  Suspect some amount of potential nerve impingement likely related to severe degenerative changes.  Discussed with the patient, she does take a very low-dose of Xanax which she is in notable pain.  Trialed hydrocodone here and she reports ports that does provide at least mild to moderate relief of the pain.  No signs or symptoms suggest etiology that would be cardiac pulmonary or acute neurologic in nature.  There is no signs or symptoms of acute neurologic deficits.  Appears consistent with radicular pain.  I will prescribe the patient a narcotic pain medicine due to their condition which I anticipate will cause at least moderate pain short term. I discussed with the patient safe use of narcotic pain medicines, and that they are not to drive, work in dangerous areas, or ever take more than prescribed (no more than 1 pill every 6 hours). We discussed that this is the type of medication that can be  overdosed on and the risks of this type of medicine. Patient is very agreeable to only use as prescribed and to never use more than prescribed.  Return precautions and treatment recommendations and follow-up discussed with the patient who is agreeable with the plan.  Patient's family driving her home.     ____________________________________________   FINAL CLINICAL IMPRESSION(S) / ED DIAGNOSES  Final diagnoses:  Cervicalgia        Note:  This document was prepared using Dragon voice recognition software  and may include unintentional dictation errors       Delman Kitten, MD 02/25/19 2346

## 2019-02-28 DIAGNOSIS — M898X1 Other specified disorders of bone, shoulder: Secondary | ICD-10-CM | POA: Diagnosis not present

## 2019-02-28 DIAGNOSIS — M47812 Spondylosis without myelopathy or radiculopathy, cervical region: Secondary | ICD-10-CM | POA: Diagnosis not present

## 2019-03-03 DIAGNOSIS — J449 Chronic obstructive pulmonary disease, unspecified: Secondary | ICD-10-CM | POA: Diagnosis not present

## 2019-03-24 DIAGNOSIS — G4733 Obstructive sleep apnea (adult) (pediatric): Secondary | ICD-10-CM | POA: Diagnosis not present

## 2019-03-28 ENCOUNTER — Telehealth: Payer: Self-pay | Admitting: Internal Medicine

## 2019-03-28 NOTE — Telephone Encounter (Signed)
-----   Message from Clarisse Gouge sent at 03/28/2019  8:58 AM EDT ----- Regarding: RE: appointment Ernestine Conrad to schedule  ----- Message ----- From: Clarisse Gouge Sent: 03/17/2019   8:49 AM EDT To: Daleen Bo Scheduling Subject: appointment [name]                              No ans no vm  ----- Message ----- From: Emily Filbert, RN Sent: 12/29/2018   4:32 PM EDT To: Rebeca Alert Burl Scheduling  The patient needs a follow up in 6 weeks with SK.  I tried to schedule her, but had to leave a message.  Please call to schedule.   OK to use: one of the device slots on Tuesday 6/2.  If that doesn't work for her- 6/16 at 8:45 am  Thanks!

## 2019-03-28 NOTE — Telephone Encounter (Signed)
Per Dr. [name], she does not need to see him. She only needs to follow up with Dr. Fletcher Anon.   That is an old message.  I have spoken with her since then.

## 2019-03-28 NOTE — Telephone Encounter (Signed)
Hey heather spoke with this lady and she is confused.  She doesn't see the need in coming to this appt .  She states she received a letter from Dr. Fletcher Anon stating she is not a good candidate for the procedure Dr. [name] would do.

## 2019-03-30 ENCOUNTER — Other Ambulatory Visit: Payer: Self-pay

## 2019-03-30 ENCOUNTER — Other Ambulatory Visit: Payer: Self-pay | Admitting: Cardiovascular Disease

## 2019-03-30 DIAGNOSIS — F5101 Primary insomnia: Secondary | ICD-10-CM

## 2019-03-30 MED ORDER — ATORVASTATIN CALCIUM 40 MG PO TABS: ORAL_TABLET | ORAL | 4 refills | Status: DC

## 2019-03-30 MED ORDER — DULOXETINE HCL 60 MG PO CPEP: 60.0000 mg | ORAL_CAPSULE | Freq: Every day | ORAL | 1 refills | Status: DC

## 2019-03-30 MED ORDER — TRAZODONE HCL 50 MG PO TABS: 50.0000 mg | ORAL_TABLET | Freq: Every day | ORAL | 1 refills | Status: DC

## 2019-03-31 ENCOUNTER — Other Ambulatory Visit: Payer: Self-pay

## 2019-03-31 ENCOUNTER — Other Ambulatory Visit: Payer: Self-pay | Admitting: *Deleted

## 2019-03-31 MED ORDER — FLECAINIDE ACETATE 50 MG PO TABS: 50.0000 mg | ORAL_TABLET | Freq: Two times a day (BID) | ORAL | 0 refills | Status: DC

## 2019-04-02 DIAGNOSIS — J449 Chronic obstructive pulmonary disease, unspecified: Secondary | ICD-10-CM | POA: Diagnosis not present

## 2019-04-06 ENCOUNTER — Ambulatory Visit: Payer: Medicare Other | Admitting: Nurse Practitioner

## 2019-04-06 ENCOUNTER — Encounter: Payer: Self-pay | Admitting: Nurse Practitioner

## 2019-04-06 ENCOUNTER — Other Ambulatory Visit: Payer: Self-pay

## 2019-04-06 VITALS — BP 121/55 | HR 78 | Resp 16 | Ht 60.0 in | Wt 184.0 lb

## 2019-04-06 DIAGNOSIS — J449 Chronic obstructive pulmonary disease, unspecified: Secondary | ICD-10-CM

## 2019-04-06 DIAGNOSIS — F411 Generalized anxiety disorder: Secondary | ICD-10-CM

## 2019-04-06 DIAGNOSIS — Z79899 Other long term (current) drug therapy: Secondary | ICD-10-CM

## 2019-04-06 DIAGNOSIS — I10 Essential (primary) hypertension: Secondary | ICD-10-CM | POA: Diagnosis not present

## 2019-04-06 DIAGNOSIS — I4892 Unspecified atrial flutter: Secondary | ICD-10-CM | POA: Diagnosis not present

## 2019-04-06 LAB — POCT URINE DRUG SCREEN
POC Amphetamine UR: NOT DETECTED
POC BENZODIAZEPINES UR: POSITIVE — AB
POC Barbiturate UR: NOT DETECTED
POC Cocaine UR: NOT DETECTED
POC Ecstasy UR: NOT DETECTED
POC Marijuana UR: NOT DETECTED
POC Methadone UR: NOT DETECTED
POC Methamphetamine UR: NOT DETECTED
POC Opiate Ur: NOT DETECTED
POC Oxycodone UR: NOT DETECTED
POC PHENCYCLIDINE UR: NOT DETECTED
POC TRICYCLICS UR: NOT DETECTED

## 2019-04-06 MED ORDER — ALPRAZOLAM 0.25 MG PO TABS: 0.5000 mg | ORAL_TABLET | Freq: Two times a day (BID) | ORAL | 3 refills | Status: DC | PRN

## 2019-04-06 NOTE — Progress Notes (Signed)
[facility] [place], [place] [address]  Internal MEDICINE  Office Visit Note  Patient Name: [name]  [id number]  [id number]  Date of Service: 04/06/2019  Chief Complaint  Patient presents with  . Medical Management of Chronic Issues  . Hyperlipidemia  . COPD  . Thyroid Problem  . Hypertension    The patient is here for routine follow up. She is having some issues with her gallbladder. States that it is acting up two to three times per day. She has seen Psychologist, sport and exercise at [facility] Needs to call and have surgery to remove gallbladder removed. She continues to have problems with anxiety and depression. Takes cymbalta 60mg   daily and alprazolam 0.25mg  twice daily when needed. She needs to have refills for this today.       Current Medication: Outpatient Encounter Medications as of 04/06/2019  Medication Sig  . acetaminophen (TYLENOL) 500 MG tablet Take 500 mg by mouth every 4 (four) hours as needed for moderate pain or headache.   . albuterol (PROVENTIL HFA;VENTOLIN HFA) 108 (90 Base) MCG/ACT inhaler Inhale 2 puffs into the lungs every 6 (six) hours as needed for wheezing or shortness of breath.  . ALPRAZolam (XANAX) 0.25 MG tablet Take 2 tablets (0.5 mg total) by mouth 2 (two) times daily as needed for anxiety.  [name] apixaban (ELIQUIS) 5 MG TABS tablet Take 1 tablet (5 mg total) by mouth 2 (two) times daily.  . Artificial Tear Solution (GENTEAL TEARS) 0.1-0.2-0.3 % SOLN Place 1 drop into both eyes daily.  [name] atorvastatin (LIPITOR) 40 MG tablet 1 tab po qhs  . Coenzyme Q10 (COQ10) 200 MG CAPS Take 200 mg by mouth daily.  . diclofenac sodium (VOLTAREN) 1 % GEL To the affected joint (Patient taking differently: Apply 2-4 g topically 3 (three) times daily as needed (shoulder pain.). To the affected joint )  . diltiazem (CARDIZEM CD) 120 MG 24 hr capsule Take 1 capsule (120 mg total) by mouth daily.  . DULoxetine (CYMBALTA) 60 MG capsule Take 1 capsule (60 mg total) by  mouth daily.  . ferrous sulfate (FERROUSUL) 325 (65 FE) MG tablet Take 650 mg by mouth daily with breakfast.   . flecainide (TAMBOCOR) 50 MG tablet Take 1 tablet (50 mg total) by mouth 2 (two) times daily.  [name] HYDROcodone-acetaminophen (NORCO/VICODIN) 5-325 MG tablet Take 1 tablet by mouth every 6 (six) hours as needed for moderate pain.  [name] levothyroxine (SYNTHROID) 25 MCG tablet Take 1 tablet (25 mcg total) by mouth daily before breakfast.  . lisinopril-hydrochlorothiazide (ZESTORETIC) 20-25 MG tablet Take 1 tablet by mouth daily.  [name] loratadine (CLARITIN) 10 MG tablet Take 10 mg by mouth daily as needed for allergies.   . Naphazoline-Pheniramine (OPCON-A) 0.027-0.315 % SOLN Place 1 drop into both eyes daily.  . OXYGEN Inhale into the lungs.  . pantoprazole (PROTONIX) 40 MG tablet Take 1 tablet (40 mg total) by mouth daily.  . potassium chloride SA (K-DUR,KLOR-CON) 20 MEQ tablet Take 1 tablet (20 mEq total) by mouth 2 (two) times daily.  . sodium chloride (OCEAN) 0.65 % SOLN nasal spray Place 1 spray into both nostrils daily as needed for congestion.  . Tiotropium Bromide-Olodaterol (STIOLTO RESPIMAT) 2.5-2.5 MCG/ACT AERS Inhale 1 puff into the lungs daily.  . traZODone (DESYREL) 50 MG tablet Take 1 tablet (50 mg total) by mouth at bedtime.  . [DISCONTINUED] ALPRAZolam (XANAX) 0.25 MG tablet Take 2 tablets (0.5 mg total) by mouth 2 (two) times daily as needed  for anxiety.  . hydrALAZINE (APRESOLINE) 25 MG tablet Take 1 tablet (25 mg total) by mouth 2 (two) times daily.  . [DISCONTINUED] amoxicillin (AMOXIL) 500 MG capsule Take 4 tablets po one time, one hour prior to dental procedure. (Patient not taking: Reported on 01/17/2019)   No facility-administered encounter medications on file as of 04/06/2019.     Surgical History: Past Surgical History:  Procedure Laterality Date  . ABDOMINAL AORTIC ANEURYSM REPAIR  2008  . ABDOMINAL HYSTERECTOMY    . AORTIC VALVE REPLACEMENT  2008  . CARDIAC  CATHETERIZATION     [facility]  . CARDIOVERSION N/A 08/15/2018   Procedure: CARDIOVERSION;  Surgeon: [name], MD;  Location: [facility] ORS;  Service: Cardiovascular;  Laterality: N/A;  . CARDIOVERSION N/A 11/14/2018   Procedure: CARDIOVERSION (CATH LAB);  Surgeon: [name], MD;  Location: [facility] ORS;  Service: Cardiovascular;  Laterality: N/A;  . CARPAL TUNNEL RELEASE    . ELECTROPHYSIOLOGIC STUDY N/A 08/17/2016   Procedure: Cardioversion;  Surgeon: [name], MD;  Location: [facility] ORS;  Service: Cardiovascular;  Laterality: N/A;  . TUMOR EXCISION Left    x3 (arm)    Medical History: Past Medical History:  Diagnosis Date  . 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 s/p bioprosthetic valve replacement 2008 at [facility];  b. 01/2015 Echo: EF 60-65%, no rwma, Gr1 DD, mildly dil LA, nl RV fxn.  . Atrial fibrillation with RVR ([place]) 01/11/2015  . Atrial flutter ([place])    a. 08/2016 s/p DCCV.  Remains on flecainide 50 mg bid.  . Basal skull fracture ([place]) 20 yrs ago  . Chronic respiratory failure ([place])   . COPD (chronic obstructive pulmonary disease) ([place])    a. on home O2 at 2L since 2008  . 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 Essential hypertension 01/24/2015  . History of cardiac cath    a. 2008 prior to Aortic aneurysm repair-->nl cors.  . History of stress test    a. 10/2015 MV: no ischemia/infarct.  [name] HLD (hyperlipidemia)   . HTN (hypertension)   . Hypothyroidism   . Obesity   . PAF (paroxysmal atrial fibrillation) ([name])    a. on Eliquis; b. CHADS2VASc = 3 (HTN, age x 1, female).  . Paroxysmal atrial fibrillation ([place]) 01/24/2015  . Right upper quadrant abdominal tenderness without rebound tenderness 03/02/2018  . S/P ascending aortic aneurysm repair 2008    Family History: Family History  Problem Relation Age of Onset  . Stroke Father   . Stroke Paternal Grandmother     Social History   Socioeconomic History  . Marital status: Single     Spouse name: Not on file  . Number of children: Not on file  . Years of education: Not on file  . Highest education level: Not on file  Occupational History  . Not on file  Social Needs  . Financial resource strain: Not on file  . Food insecurity    Worry: Not on file    Inability: Not on file  . Transportation needs    Medical: Not on file    Non-medical: Not on file  Tobacco Use  . Smoking status: Former Smoker    Types: Cigarettes  . Smokeless tobacco: Never Used  Substance and Sexual Activity  . Alcohol use: No  . Drug use: No  . Sexual activity: Never    Birth control/protection: Surgical  Lifestyle  .  Physical activity    Days per week: Not on file    Minutes per session: Not on file  . Stress: Not on file  Relationships  . Social Herbalist on phone: Not on file    Gets together: Not on file    Attends religious service: Not on file    Active member of club or organization: Not on file    Attends meetings of clubs or organizations: Not on file    Relationship status: Not on file  . Intimate partner violence    Fear of current or ex partner: Not on file    Emotionally abused: Not on file    Physically abused: Not on file    Forced sexual activity: Not on file  Other Topics Concern  . Not on file  Social History Narrative  . Not on file      Review of Systems  Constitutional: Positive for fatigue. Negative for chills and unexpected weight change.  HENT: Negative for congestion, postnasal drip, rhinorrhea, sneezing and sore throat.   Eyes: Negative for redness.  Respiratory: Positive for shortness of breath and wheezing. Negative for cough and chest tightness.        Continues to use oxygen at all times.   Cardiovascular: Positive for palpitations. Negative for chest pain.  Gastrointestinal: Negative for abdominal pain, constipation, diarrhea, nausea and vomiting.  Endocrine: Negative for cold intolerance, heat intolerance, polydipsia and  polyuria.       Well controlled thyroid disease.   Musculoskeletal: Negative for arthralgias, back pain, joint swelling and neck pain.  Skin: Negative for rash.  Allergic/Immunologic: Positive for environmental allergies.  Neurological: Negative for dizziness, tremors, numbness and headaches.  Hematological: Negative for adenopathy. Does not bruise/bleed easily.  Psychiatric/Behavioral: Positive for dysphoric mood. Negative for behavioral problems (Depression), sleep disturbance and suicidal ideas. The patient is nervous/anxious.    Today's Vitals   04/06/19 1447  BP: (!) 121/55  Pulse: 78  Resp: 16  SpO2: 96%  Weight: 184 lb (83.5 kg)  Height: 5' (1.524 m)   Body mass index is 35.94 kg/m.  Physical Exam Vitals signs and nursing note reviewed.  Constitutional:      Appearance: Normal appearance. She is well-developed.  HENT:     Head: Normocephalic and atraumatic.     Nose: Nose normal.     Mouth/Throat:     Pharynx: No oropharyngeal exudate.  Eyes:     Conjunctiva/sclera: Conjunctivae normal.     Pupils: Pupils are equal, round, and reactive to light.  Neck:     Musculoskeletal: Normal range of motion and neck supple.     Thyroid: No thyromegaly.     Vascular: No JVD.     Trachea: No tracheal deviation.  Cardiovascular:     Rate and Rhythm: Normal rate. Rhythm irregular.     Heart sounds: Murmur present.  Pulmonary:     Effort: Pulmonary effort is normal.     Breath sounds: Normal breath sounds. No rhonchi or rales.  Abdominal:     General: Bowel sounds are normal.     Palpations: Abdomen is soft.     Tenderness: There is no abdominal tenderness.  Musculoskeletal: Normal range of motion.  Lymphadenopathy:     Cervical: No cervical adenopathy.  Skin:    General: Skin is warm and dry.  Neurological:     Mental Status: She is alert and oriented to person, place, and time.  Psychiatric:  Mood and Affect: Mood is anxious and depressed.        Speech: Speech  normal.        Behavior: Behavior normal.        Thought Content: Thought content normal.        Judgment: Judgment normal.   Assessment/Plan: 1. Essential hypertension Stable. Continue bp medication as prescribed   2. Paroxysmal atrial flutter ([place]) Continue regular visits with cardiology as scheduled.   3. 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 exacerbation ([place]) Continue inhalers and respiratory medication as prescribed   4. GAD (generalized anxiety disorder) Continue duloxetine 60mg  daily. May take alprazolam 0.25mg  twice daily if needed for acute anxiety. New prescription sent to her pharmacy.  - ALPRAZolam (XANAX) 0.25 MG tablet; Take 2 tablets (0.5 mg total) by mouth 2 (two) times daily as needed for anxiety.  Dispense: 60 tablet; Refill: 3  5. Encounter for long-term (current) use of medications - POCT Urine Drug Screen appropriatley positive for [facility] only.   General Counseling: Jahara verbalizes understanding of the findings of todays visit and agrees with plan of treatment. I have discussed any further diagnostic evaluation that may be needed or ordered today. We also reviewed her medications today. she has been encouraged to call the office with any questions or concerns that should arise related to todays visit.  Reviewed risks and possible side effects associated with taking opiates, benzodiazepines and other CNS depressants. Combination of these could cause dizziness and drowsiness. Advised patient not to drive or operate machinery when taking these medications, as patient's and other's life can be at risk and will have consequences. Patient verbalized understanding in this matter. Dependence and abuse for these drugs will be monitored closely. A Controlled substance policy and procedure is on file which allows [facility] to order a urine drug screen test at any visit. Patient understands and agrees with the plan  This patient was seen by Leretha Pol FNP  Collaboration with Dr [name] as a part of collaborative care agreement  Orders Placed This Encounter  Procedures  . POCT Urine Drug Screen    Meds ordered this encounter  Medications  . ALPRAZolam (XANAX) 0.25 MG tablet    Sig: Take 2 tablets (0.5 mg total) by mouth 2 (two) times daily as needed for anxiety.    Dispense:  60 tablet    Refill:  3    Order Specific Question:   Supervising Provider    Answer:   [name] [[id number]]    Time spent: 60 Minutes      Dr [name] Internal medicine

## 2019-04-12 ENCOUNTER — Ambulatory Visit: Payer: Medicare Other

## 2019-04-12 ENCOUNTER — Other Ambulatory Visit: Payer: Self-pay

## 2019-04-12 DIAGNOSIS — G4733 Obstructive sleep apnea (adult) (pediatric): Secondary | ICD-10-CM

## 2019-04-12 NOTE — Progress Notes (Signed)
95 percentile pressure 8   95th percentile leak 5.1   apnea index 0.5 /hr  apnea-hypopnea index  0.7 /hr   total days used  >4 hr 90 days  total days used <4 hr 0 days  Total compliance 100 percent  She is doing great on cpap no problems or questions

## 2019-04-17 ENCOUNTER — Telehealth: Payer: Self-pay | Admitting: Cardiovascular Disease

## 2019-04-17 NOTE — Telephone Encounter (Signed)
Patient is trying to file a lawsuit. She has been without air conditioning for 13 days. Apartment complex gave her a mobile unit that does not reach to her bedroom that is back in the back of the apartment. Recent rain fall caused a leak. Maintenance had to fix the ceiling. Looked like there was mold. There was a smell as well.  Mold inspector came and said there was mold and that patient should not have been sleeping in there. Coughing more. Called her lung doctor already.  She requests a letter stating what her heart conditions are. Advised I will make sure it is ok to generate a letter.

## 2019-04-17 NOTE — Telephone Encounter (Signed)
Patient needs a letter stating her heart condition for her apartment complex. States she found out there is mold and would like a letter. Please call to discuss.

## 2019-04-17 NOTE — Telephone Encounter (Signed)
She has multiple chronic medical conditions including atrial fibrillation/flutter, bioprosthetic aortic valve replacement, COPD on home oxygen,  sleep apnea on CPAP, hypertension and  hyperlipidemia.

## 2019-04-18 ENCOUNTER — Encounter: Payer: Self-pay | Admitting: *Deleted

## 2019-04-18 NOTE — Telephone Encounter (Signed)
No answer or VM on home number. Left message on cell number that I will be putting letter in the mail later today and to call if any further questions.  Letter generated and placed in Dr Tyrell Antonio office for him to sign.

## 2019-04-19 NOTE — Telephone Encounter (Signed)
Letter signed by Dr.Arida and placed in the out going mail.

## 2019-04-20 ENCOUNTER — Other Ambulatory Visit: Payer: Self-pay | Admitting: Nurse Practitioner

## 2019-05-02 ENCOUNTER — Encounter: Payer: Self-pay | Admitting: Nurse Practitioner

## 2019-05-02 ENCOUNTER — Other Ambulatory Visit: Payer: Self-pay

## 2019-05-02 ENCOUNTER — Ambulatory Visit: Payer: Medicare Other | Admitting: Nurse Practitioner

## 2019-05-02 VITALS — BP 151/73 | HR 81 | Temp 97.9°F | Resp 16 | Ht <= 58 in | Wt 184.0 lb

## 2019-05-02 DIAGNOSIS — L209 Atopic dermatitis, unspecified: Secondary | ICD-10-CM | POA: Diagnosis not present

## 2019-05-02 DIAGNOSIS — Z9981 Dependence on supplemental oxygen: Secondary | ICD-10-CM | POA: Diagnosis not present

## 2019-05-02 DIAGNOSIS — J449 Chronic obstructive pulmonary disease, unspecified: Secondary | ICD-10-CM

## 2019-05-02 MED ORDER — TRIAMCINOLONE ACETONIDE 0.025 % EX CREA: 1.0000 "application " | TOPICAL_CREAM | Freq: Two times a day (BID) | CUTANEOUS | 2 refills | Status: DC

## 2019-05-02 NOTE — Progress Notes (Signed)
[facility] [place], [place] [address]  Internal MEDICINE  Office Visit Note  Patient Name: [name]  [id number]  [id number]  Date of Service: 05/02/2019   Pt is here for a sick visit.  Chief Complaint  Patient presents with  . Pruritis    itching all over body, exczema lotion helps for a few hours     The patient is here for sick visit. States that she started itching all over. Started on the chest and moved down to her abdomen. Is now itching in her head, her arms, and her legs. No rash is noted. She states that she has not eaten any new food, has not used any new detergents or soaps. Has tried using OTC lotions for eczema. These help for a short period of time, but then itching comes right back.        Current Medication:  Outpatient Encounter Medications as of 05/02/2019  Medication Sig  . acetaminophen (TYLENOL) 500 MG tablet Take 500 mg by mouth every 4 (four) hours as needed for moderate pain or headache.   . albuterol (PROVENTIL HFA;VENTOLIN HFA) 108 (90 Base) MCG/ACT inhaler Inhale 2 puffs into the lungs every 6 (six) hours as needed for wheezing or shortness of breath.  . ALPRAZolam (XANAX) 0.25 MG tablet Take 2 tablets (0.5 mg total) by mouth 2 (two) times daily as needed for anxiety.  [name] apixaban (ELIQUIS) 5 MG TABS tablet Take 1 tablet (5 mg total) by mouth 2 (two) times daily.  . Artificial Tear Solution (GENTEAL TEARS) 0.1-0.2-0.3 % SOLN Place 1 drop into both eyes daily.  [name] atorvastatin (LIPITOR) 40 MG tablet 1 tab po qhs  . Coenzyme Q10 (COQ10) 200 MG CAPS Take 200 mg by mouth daily.  . diclofenac sodium (VOLTAREN) 1 % GEL To the affected joint (Patient taking differently: Apply 2-4 g topically 3 (three) times daily as needed (shoulder pain.). To the affected joint )  . diltiazem (CARDIZEM CD) 120 MG 24 hr capsule Take 1 capsule (120 mg total) by mouth daily.  . DULoxetine (CYMBALTA) 60 MG capsule Take 1 capsule (60 mg total) by  mouth daily.  . ferrous sulfate (FERROUSUL) 325 (65 FE) MG tablet Take 650 mg by mouth daily with breakfast.   . flecainide (TAMBOCOR) 50 MG tablet Take 1 tablet (50 mg total) by mouth 2 (two) times daily.  [name] HYDROcodone-acetaminophen (NORCO/VICODIN) 5-325 MG tablet Take 1 tablet by mouth every 6 (six) hours as needed for moderate pain.  [name] levothyroxine (SYNTHROID) 25 MCG tablet Take 1 tablet (25 mcg total) by mouth daily before breakfast.  . lisinopril-hydrochlorothiazide (ZESTORETIC) 20-25 MG tablet Take 1 tablet by mouth daily.  [name] loratadine (CLARITIN) 10 MG tablet Take 10 mg by mouth daily as needed for allergies.   . Naphazoline-Pheniramine (OPCON-A) 0.027-0.315 % SOLN Place 1 drop into both eyes daily.  . OXYGEN Inhale into the lungs.  . pantoprazole (PROTONIX) 40 MG tablet Take 1 tablet (40 mg total) by mouth daily.  . potassium chloride SA (K-DUR,KLOR-CON) 20 MEQ tablet Take 1 tablet (20 mEq total) by mouth 2 (two) times daily.  . sodium chloride (OCEAN) 0.65 % SOLN nasal spray Place 1 spray into both nostrils daily as needed for congestion.  . Tiotropium Bromide-Olodaterol (STIOLTO RESPIMAT) 2.5-2.5 MCG/ACT AERS Inhale 1 puff into the lungs daily.  . traZODone (DESYREL) 50 MG tablet Take 1 tablet (50 mg total) by mouth at bedtime.  . hydrALAZINE (APRESOLINE) 25 MG tablet Take  1 tablet (25 mg total) by mouth 2 (two) times daily.  [name] triamcinolone (KENALOG) 0.025 % cream Apply 1 application topically 2 (two) times daily.   No facility-administered encounter medications on file as of 05/02/2019.       Medical History: Past Medical History:  Diagnosis Date  . 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 s/p bioprosthetic valve replacement 2008 at [facility];  b. 01/2015 Echo: EF 60-65%, no rwma, Gr1 DD, mildly dil LA, nl RV fxn.  . Atrial fibrillation with RVR ([place]) 01/11/2015  . Atrial flutter ([place])    a. 08/2016 s/p DCCV.  Remains on flecainide 50 mg bid.  . Basal skull fracture ([place])  20 yrs ago  . Chronic respiratory failure ([place])   . COPD (chronic obstructive pulmonary disease) ([place])    a. on home O2 at 2L since 2008  . 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 Essential hypertension 01/24/2015  . History of cardiac cath    a. 2008 prior to Aortic aneurysm repair-->nl cors.  . History of stress test    a. 10/2015 MV: no ischemia/infarct.  [name] HLD (hyperlipidemia)   . HTN (hypertension)   . Hypothyroidism   . Obesity   . PAF (paroxysmal atrial fibrillation) ([name])    a. on Eliquis; b. CHADS2VASc = 3 (HTN, age x 1, female).  . Paroxysmal atrial fibrillation ([place]) 01/24/2015  . Right upper quadrant abdominal tenderness without rebound tenderness 03/02/2018  . S/P ascending aortic aneurysm repair 2008     Today's Vitals   05/02/19 1423  BP: (!) 151/73  Pulse: 81  Resp: 16  Temp: 97.9 F (36.6 C)  SpO2: 95%  Weight: 184 lb (83.5 kg)  Height: 4\' 10"  (1.473 m)   Body mass index is 38.46 kg/m.  Review of Systems  Constitutional: Positive for fatigue. Negative for chills and unexpected weight change.  HENT: Negative for congestion, postnasal drip, rhinorrhea, sneezing and sore throat.   Eyes: Negative for redness.  Respiratory: Positive for shortness of breath. Negative for cough and chest tightness.   Cardiovascular: Negative for chest pain and palpitations.  Gastrointestinal: Negative for abdominal pain, constipation, diarrhea, nausea and vomiting.  Musculoskeletal: Negative for arthralgias, back pain, joint swelling and neck pain.  Skin: Negative for rash.       Generalized itching all over the body. No rash identified.  Allergic/Immunologic: Positive for environmental allergies.  Neurological: Negative for tremors and numbness.  Hematological: Negative for adenopathy. Does not bruise/bleed easily.  Psychiatric/Behavioral: Negative for behavioral problems (Depression), sleep disturbance and suicidal ideas. The patient is nervous/anxious.      Physical Exam Vitals signs and nursing note reviewed.  Constitutional:      General: She is not in acute distress.    Appearance: Normal appearance. She is well-developed. She is not diaphoretic.  HENT:     Head: Normocephalic and atraumatic.     Mouth/Throat:     Pharynx: No oropharyngeal exudate.  Eyes:     Pupils: Pupils are equal, round, and reactive to light.  Neck:     Musculoskeletal: Normal range of motion and neck supple.     Thyroid: No thyromegaly.     Vascular: No JVD.     Trachea: No tracheal deviation.  Cardiovascular:     Rate and Rhythm: Normal rate and regular rhythm.     Heart sounds: Normal heart sounds. No murmur. No friction rub. No gallop.  Pulmonary:     Effort: Pulmonary effort is normal. No respiratory distress.     Breath sounds: Normal breath sounds. No wheezing or rales.     Comments: Oxygen use via nasal cannula at all times.  Chest:     Chest wall: No tenderness.  Abdominal:     Palpations: Abdomen is soft.  Musculoskeletal: Normal range of motion.  Lymphadenopathy:     Cervical: No cervical adenopathy.  Skin:    General: Skin is warm and dry.     Findings: No erythema or rash.     Comments: The patient has generalized pruritis. No focal rash or skin lesions are noted at this time.   Neurological:     Mental Status: She is alert and oriented to person, place, and time.     Cranial Nerves: No cranial nerve deficit.  Psychiatric:        Attention and Perception: Attention and perception normal.        Mood and Affect: Mood is anxious and depressed.        Speech: Speech normal.        Behavior: Behavior normal.        Thought Content: Thought content normal.        Cognition and Memory: Cognition and memory normal.        Judgment: Judgment normal.   Assessment/Plan: 1. Atopic dermatitis, unspecified type Start triamcinolone cream. Apply to all affected areas BID as needed. Continue to use OTC lotion for eczema as prescribed  -  triamcinolone (KENALOG) 0.025 % cream; Apply 1 application topically 2 (two) times daily.  Dispense: 80 g; Refill: 2  2. 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 exacerbation ([place]) Continue inhalers and respiratory medications as prescribed   3. Oxygen dependent Continue to use nasal cannula oxygen as prescribed   General Counseling: Zilda verbalizes understanding of the findings of todays visit and agrees with plan of treatment. I have discussed any further diagnostic evaluation that may be needed or ordered today. We also reviewed her medications today. she has been encouraged to call the office with any questions or concerns that should arise related to todays visit.    Counseling:  This patient was seen by [place] with Dr [name] as a part of collaborative care agreement  Meds ordered this encounter  Medications  . triamcinolone (KENALOG) 0.025 % cream    Sig: Apply 1 application topically 2 (two) times daily.    Dispense:  80 g    Refill:  2    Order Specific Question:   Supervising Provider    Answer:   [name] [[id number]]    Time spent: 15 Minutes

## 2019-05-03 DIAGNOSIS — J449 Chronic obstructive pulmonary disease, unspecified: Secondary | ICD-10-CM | POA: Diagnosis not present

## 2019-05-04 ENCOUNTER — Other Ambulatory Visit: Payer: Self-pay | Admitting: Internal Medicine

## 2019-05-08 NOTE — Telephone Encounter (Signed)
Secure chat to Meriam Sprague since I am working virtually. She printed off another copy of the letter and placed it at the front desk for patient pick up.   I have called and notified the patient this is ready. She states she will come this afternoon. I have advised her to come through the [place] entrance.

## 2019-05-08 NOTE — Telephone Encounter (Signed)
Patient calling in regarding the letter she requested. She has not received it and states this is her third phone call regarding this. Patient confirmed the correct address is on file. Patient is willing to come pick it up if able. Please advise.

## 2019-05-17 ENCOUNTER — Telehealth: Payer: Self-pay | Admitting: Cardiovascular Disease

## 2019-05-17 NOTE — Telephone Encounter (Signed)
Returned the patients call. [email or web address]. 

## 2019-05-17 NOTE — Telephone Encounter (Signed)
Patient returning call.

## 2019-05-17 NOTE — Telephone Encounter (Signed)
Spoke with the patient. Patient sts that she has bee having intermittent chest pain, fluctuation in HR and sob for several weeks. Patient is currently scheduled to see Dr. Fletcher Anon in October , but would like to be be seen sooner.  Appt scheduled with Dr. Fletcher Anon on 05/18/19 @ 3:17m. Patient adv that she will need to enter through the medical mall.  Adv the patient to arrive 78min prior to her appt to allot time for her to get down to our office. COVID screening done.      COVID-19 Pre-Screening Questions:  . In the past 7 to 10 days have you had a cough,  shortness of breath, headache, congestion, fever (100 or greater) body aches, chills, sore throat, or sudden loss of taste or sense of smell? No . Have you been around anyone with known Covid 19. No . Have you been around anyone who is awaiting Covid 19 test results in the past 7 to 10 days? No . Have you been around anyone who has been exposed to Covid 19, or has mentioned symptoms of Covid 19 within the past 7 to 10 days? No

## 2019-05-17 NOTE — Telephone Encounter (Signed)
Pt c/o of Chest Pain: STAT if CP now or developed within 24 hours  1. Are you having CP right now? No   2. Are you experiencing any other symptoms (ex. SOB, nausea, vomiting, sweating)? SOB, real tired, HR shoots up then back down   3. How long have you been experiencing CP? Off and on for about 6 weeks   4. Is your CP continuous or coming and going? Comes and goes   5. Have you taken Nitroglycerin? no ? Patient wants to schedule an appointment but felt next available was too far.  Please advise on a sooner appointment.

## 2019-05-18 ENCOUNTER — Other Ambulatory Visit: Payer: Self-pay

## 2019-05-18 ENCOUNTER — Ambulatory Visit: Payer: Medicare Other | Admitting: Cardiovascular Disease

## 2019-05-18 ENCOUNTER — Encounter: Payer: Self-pay | Admitting: Cardiovascular Disease

## 2019-05-18 VITALS — BP 150/74 | HR 70 | Ht <= 58 in | Wt 185.8 lb

## 2019-05-18 DIAGNOSIS — I10 Essential (primary) hypertension: Secondary | ICD-10-CM

## 2019-05-18 DIAGNOSIS — I4892 Unspecified atrial flutter: Secondary | ICD-10-CM

## 2019-05-18 DIAGNOSIS — I359 Nonrheumatic aortic valve disorder, unspecified: Secondary | ICD-10-CM

## 2019-05-18 MED ORDER — ATORVASTATIN CALCIUM 40 MG PO TABS: ORAL_TABLET | ORAL | 1 refills | Status: DC

## 2019-05-18 NOTE — Progress Notes (Signed)
Cardiology Office Note   Date:  05/18/2019   ID:  [name], DOB 06-08-1946, MRN [id number]  PCP:  [name], MD  Cardiologist:   [name], MD   Chief Complaint  Patient presents with  . other    Chest pain c/o sob . Meds reviewed verbally with pt.      History of Present Illness: [name] is a 73 y.o. female who presents for A follow-up visit regarding persistent atrial fibrillation/flutter and valvular heart disease. 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  Cardiac catheterization before valve replacement showed no obstructive coronary artery disease. Nuclear stress test in December 2018 showed no evidence of ischemia with normal ejection fraction. Echocardiogram in August 2019 showed normal LV systolic function with mild LVH, bioprosthetic aortic valve with normal function and no evidence of pulmonary hypertension. She had atrial flutter with tachycardia in December 2019.  She underwent successful cardioversion.  She had recurrent atrial flutter and thus I referred her to Dr. [name].  Given her COPD requiring oxygen and other comorbidities, ablation was not recommended.  Due to QRS widening, the dose of flecainide was decreased back to 50 mg twice daily.  She has been doing reasonably well from a cardiac standpoint with no recurrent arrhythmia since then.  She has been under stress at home dealing with her daughter.  She has occasional episodes of chest pain at rest which she thinks is due to stress.   Past Medical History:  Diagnosis Date  . 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 s/p bioprosthetic valve replacement 2008 at [facility];  b. 01/2015 Echo: EF 60-65%, no rwma, Gr1 DD, mildly dil LA, nl RV fxn.  .  Atrial fibrillation with RVR ([place]) 01/11/2015  . Atrial flutter ([place])    a. 08/2016 s/p DCCV.  Remains on flecainide 50 mg bid.  . Basal skull fracture ([place]) 20 yrs ago  . Chronic respiratory failure ([place])   . COPD (chronic obstructive pulmonary disease) ([place])    a. on home O2 at 2L since 2008  . 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 Essential hypertension 01/24/2015  . History of cardiac cath    a. 2008 prior to Aortic aneurysm repair-->nl cors.  . History of stress test    a. 10/2015 MV: no ischemia/infarct.  [name] HLD (hyperlipidemia)   . HTN (hypertension)   . Hypothyroidism   . Obesity   . PAF (paroxysmal atrial fibrillation) ([name])    a. on Eliquis; b. CHADS2VASc = 3 (HTN, age x 1, female).  . Paroxysmal atrial fibrillation ([place]) 01/24/2015  . Right upper quadrant abdominal tenderness without rebound tenderness 03/02/2018  . S/P ascending aortic aneurysm repair 2008    Past Surgical History:  Procedure Laterality Date  . ABDOMINAL AORTIC ANEURYSM REPAIR  2008  . ABDOMINAL HYSTERECTOMY    . AORTIC VALVE REPLACEMENT  2008  . CARDIAC CATHETERIZATION     [facility]  . CARDIOVERSION N/A 08/15/2018   Procedure: CARDIOVERSION;  Surgeon: [name], MD;  Location: [facility] ORS;  Service: Cardiovascular;  Laterality: N/A;  . CARDIOVERSION N/A 11/14/2018   Procedure: CARDIOVERSION (CATH LAB);  Surgeon: [name],  MD;  Location: [facility] ORS;  Service: Cardiovascular;  Laterality: N/A;  . CARPAL TUNNEL RELEASE    . ELECTROPHYSIOLOGIC STUDY N/A 08/17/2016   Procedure: Cardioversion;  Surgeon: [name], MD;  Location: [facility] ORS;  Service: Cardiovascular;  Laterality: N/A;  . TUMOR EXCISION Left    x3 (arm)     Current Outpatient Medications  Medication Sig Dispense Refill  . acetaminophen (TYLENOL) 500 MG tablet Take 500 mg by mouth every 4 (four) hours as needed for moderate pain or headache.     . albuterol (PROVENTIL HFA;VENTOLIN HFA) 108 (90 Base) MCG/ACT  inhaler Inhale 2 puffs into the lungs every 6 (six) hours as needed for wheezing or shortness of breath. 3 Inhaler 3  . ALPRAZolam (XANAX) 0.25 MG tablet Take 2 tablets (0.5 mg total) by mouth 2 (two) times daily as needed for anxiety. 60 tablet 3  . apixaban (ELIQUIS) 5 MG TABS tablet Take 1 tablet (5 mg total) by mouth 2 (two) times daily. 180 tablet 3  . Artificial Tear Solution (GENTEAL TEARS) 0.1-0.2-0.3 % SOLN Place 1 drop into both eyes daily.    [name] atorvastatin (LIPITOR) 40 MG tablet 1 tab po qhs 90 tablet 1  . Coenzyme Q10 (COQ10) 200 MG CAPS Take 200 mg by mouth daily.    . diclofenac sodium (VOLTAREN) 1 % GEL To the affected joint (Patient taking differently: Apply 2-4 g topically 3 (three) times daily as needed (shoulder pain.). To the affected joint ) 100 Tube 3  . diltiazem (CARDIZEM CD) 120 MG 24 hr capsule Take 1 capsule (120 mg total) by mouth daily. 90 capsule 3  . DULoxetine (CYMBALTA) 60 MG capsule Take 1 capsule (60 mg total) by mouth daily. 90 capsule 1  . ferrous sulfate (FERROUSUL) 325 (65 FE) MG tablet Take 650 mg by mouth daily with breakfast.     . flecainide (TAMBOCOR) 50 MG tablet Take 1 tablet (50 mg total) by mouth 2 (two) times daily. 60 tablet 3  . hydrALAZINE (APRESOLINE) 25 MG tablet Take 1 tablet (25 mg total) by mouth 2 (two) times daily. 180 tablet 2  . HYDROcodone-acetaminophen (NORCO/VICODIN) 5-325 MG tablet Take 1 tablet by mouth every 6 (six) hours as needed for moderate pain. 12 tablet 0  . levothyroxine (SYNTHROID) 25 MCG tablet Take 1 tablet (25 mcg total) by mouth daily before breakfast. 90 tablet 1  . lisinopril-hydrochlorothiazide (ZESTORETIC) 20-25 MG tablet Take 1 tablet by mouth daily. 90 tablet 0  . loratadine (CLARITIN) 10 MG tablet Take 10 mg by mouth daily as needed for allergies.     . OXYGEN Inhale into the lungs.    . pantoprazole (PROTONIX) 40 MG tablet Take 1 tablet (40 mg total) by mouth daily. 90 tablet 1  . potassium chloride SA  (K-DUR,KLOR-CON) 20 MEQ tablet Take 1 tablet (20 mEq total) by mouth 2 (two) times daily. (Patient taking differently: Take 40 mEq by mouth 2 (two) times daily. ) 180 tablet 3  . sodium chloride (OCEAN) 0.65 % SOLN nasal spray Place 1 spray into both nostrils daily as needed for congestion.    . Tiotropium Bromide-Olodaterol (STIOLTO RESPIMAT) 2.5-2.5 MCG/ACT AERS Inhale 1 puff into the lungs daily.    . traZODone (DESYREL) 50 MG tablet Take 1 tablet (50 mg total) by mouth at bedtime. 90 tablet 1  . triamcinolone (KENALOG) 0.025 % cream Apply 1 application topically 2 (two) times daily. 80 g 2   No current facility-administered medications for this visit.  Allergies:   Benadryl [diphenhydramine hcl (sleep)], Cetirizine, Diphenhydramine-zinc acetate, Lasix [furosemide], Levaquin [levofloxacin in d5w], Levofloxacin, Meloxicam, Soy allergy, and Sulfa antibiotics    Social History:  The patient  reports that she has quit smoking. Her smoking use included cigarettes. She has never used smokeless tobacco. She reports that she does not drink alcohol or use drugs.   Family History:  The patient's family history includes Stroke in her father and paternal grandmother.    ROS:  Please see the history of present illness.   Otherwise, review of systems are positive for none.   All other systems are reviewed and negative.    PHYSICAL EXAM: VS:  BP (!) 150/74 (BP Location: Right Arm, Patient Position: Sitting, Cuff Size: Large)   Pulse 70   Ht 4\' 10"  (1.473 m)   Wt 185 lb 12 oz (84.3 kg)   SpO2 98%   BMI 38.82 kg/m  , BMI Body mass index is 38.82 kg/m. GEN: Well nourished, well developed, in no acute distress  HEENT: normal  Neck: no JVD, carotid bruits, or masses Cardiac: Regular but tachycardic; no  rubs, or gallops,no edema . There  is a 2/6 systolic ejection murmur in the aortic area.  Respiratory:  clear to auscultation bilaterally, normal work of breathing GI: soft, nontender,  nondistended, + BS MS: no deformity or atrophy  Skin: warm and dry, no rash Neuro:  Strength and sensation are intact Psych: euthymic mood, full affect   EKG:  EKG is ordered today. EKG showed sinus rhythm with a heart rate of 70 bpm.  Borderline LVH.  QRS duration is 94 ms.  Recent Labs: 10/31/2018: BUN 10; Creatinine, Ser 0.61; Hemoglobin 12.2; Magnesium 2.0; Platelets 319; Potassium 3.5; Sodium 134; TSH 1.755    Lipid Panel No results found for: CHOL, TRIG, HDL, CHOLHDL, VLDL, LDLCALC, LDLDIRECT    Wt Readings from Last 3 Encounters:  05/18/19 185 lb 12 oz (84.3 kg)  05/02/19 184 lb (83.5 kg)  04/06/19 184 lb (83.5 kg)         ASSESSMENT AND PLAN:  1.  Paroxysmal Atrial flutter: She is maintaining in sinus rhythm with small dose flecainide and diltiazem.  She is on anticoagulation with Eliquis.  I made no change in her medications.  The patient has been seen by Dr. [name] who advised against ablation at the present time.    2. Essential hypertension: Blood pressure is mildly elevated.  Continue to monitor for now.  3. History of aortic valve replacement with bioprosthetic valve: This was functioning normally on echo from last year..  4.  Recurrent chest pain in setting of stress and anxiety.  Stress test in the past was normal.   FU with me in 6 months  Signed,  [name], MD  05/18/2019 3:34 PM    [employer]

## 2019-05-18 NOTE — Patient Instructions (Signed)
Medication Instructions:  Your physician recommends that you continue on your current medications as directed. Please refer to the Current Medication list given to you today.  If you need a refill on your cardiac medications before your next appointment, please call your pharmacy.   Lab work: None ordered If you have labs (blood work) drawn today and your tests are completely normal, you will receive your results only by: . MyChart Message (if you have MyChart) OR . A paper copy in the mail If you have any lab test that is abnormal or we need to change your treatment, we will call you to review the results.  Testing/Procedures: None ordered  Follow-Up: At [facility], you and your health needs are our priority.  As part of our continuing mission to provide you with exceptional heart care, we have created designated Provider Care Teams.  These Care Teams include your primary Cardiologist (physician) and Advanced Practice Providers (APPs -  Physician Assistants and Nurse Practitioners) who all work together to provide you with the care you need, when you need it. You will need a follow up appointment in 6 months.  Please call our office 2 months in advance to schedule this appointment.  You may see Dr. Arida  or one of the following Advanced Practice Providers on your designated Care Team:   Christopher Berge, NP Ryan Dunn, PA-C . Jacquelyn Visser, PA-C  Any Other Special Instructions Will Be Listed Below (If Applicable). N/A   

## 2019-05-18 NOTE — Telephone Encounter (Signed)
Lmom to pt which phar she used for mail order we received from [employer]

## 2019-05-23 ENCOUNTER — Encounter: Payer: Self-pay | Admitting: Internal Medicine

## 2019-05-23 ENCOUNTER — Ambulatory Visit: Payer: Medicare Other | Admitting: Internal Medicine

## 2019-05-23 ENCOUNTER — Other Ambulatory Visit: Payer: Self-pay

## 2019-05-23 VITALS — BP 142/74 | HR 85 | Resp 18 | Ht <= 58 in | Wt 185.0 lb

## 2019-05-23 DIAGNOSIS — J449 Chronic obstructive pulmonary disease, unspecified: Secondary | ICD-10-CM | POA: Diagnosis not present

## 2019-05-23 DIAGNOSIS — I10 Essential (primary) hypertension: Secondary | ICD-10-CM

## 2019-05-23 DIAGNOSIS — G4733 Obstructive sleep apnea (adult) (pediatric): Secondary | ICD-10-CM | POA: Diagnosis not present

## 2019-05-23 DIAGNOSIS — Z9981 Dependence on supplemental oxygen: Secondary | ICD-10-CM | POA: Diagnosis not present

## 2019-05-23 DIAGNOSIS — Z9989 Dependence on other enabling machines and devices: Secondary | ICD-10-CM

## 2019-05-23 DIAGNOSIS — R0602 Shortness of breath: Secondary | ICD-10-CM | POA: Diagnosis not present

## 2019-05-23 NOTE — Progress Notes (Signed)
[facility] [place], [place] [address]  Pulmonary Sleep Medicine   Office Visit Note  Patient Name: [name] DOB: [birth year] MRN [id number]  Date of Service: 05/23/2019  Complaints/HPI: Pt is here for follow up.  She denies any current or recent issues.  She has been wearing her cpap daily with no difficulty.  She is cleaning her machine and replacing her mask, seal and tubing as instructed.  She denies any mask leaks.  She continues to also wear her oxygen continually.  She uses 3 lpm when at home, and 2lpm when using her portable because it can not deliver anything higher. She reports that she has had a sharp increase in DOE, and shortness of breath in general.   ROS  General: (-) fever, (-) chills, (-) night sweats, (-) weakness Skin: (-) rashes, (-) itching,. Eyes: (-) visual changes, (-) redness, (-) itching. Nose and Sinuses: (-) nasal stuffiness or itchiness, (-) postnasal drip, (-) nosebleeds, (-) sinus trouble. Mouth and Throat: (-) sore throat, (-) hoarseness. Neck: (-) swollen glands, (-) enlarged thyroid, (-) neck pain. Respiratory: - cough, (-) bloody sputum, +shortness of breath, - wheezing. Cardiovascular: - ankle swelling, (-) chest pain. Lymphatic: (-) lymph node enlargement. Neurologic: (-) numbness, (-) tingling. Psychiatric: (-) anxiety, (-) depression   Current Medication: Outpatient Encounter Medications as of 05/23/2019  Medication Sig  . acetaminophen (TYLENOL) 500 MG tablet Take 500 mg by mouth every 4 (four) hours as needed for moderate pain or headache.   . albuterol (PROVENTIL HFA;VENTOLIN HFA) 108 (90 Base) MCG/ACT inhaler Inhale 2 puffs into the lungs every 6 (six) hours as needed for wheezing or shortness of breath.  . ALPRAZolam (XANAX) 0.25 MG tablet Take 2 tablets (0.5 mg total) by mouth 2 (two) times daily as needed for anxiety.  [name] apixaban (ELIQUIS) 5 MG TABS tablet Take 1 tablet (5 mg total) by mouth 2 (two) times  daily.  . Artificial Tear Solution (GENTEAL TEARS) 0.1-0.2-0.3 % SOLN Place 1 drop into both eyes daily.  [name] atorvastatin (LIPITOR) 40 MG tablet 1 tab po qhs  . Coenzyme Q10 (COQ10) 200 MG CAPS Take 200 mg by mouth daily.  . diclofenac sodium (VOLTAREN) 1 % GEL To the affected joint (Patient taking differently: Apply 2-4 g topically 3 (three) times daily as needed (shoulder pain.). To the affected joint )  . diltiazem (CARDIZEM CD) 120 MG 24 hr capsule Take 1 capsule (120 mg total) by mouth daily.  . DULoxetine (CYMBALTA) 60 MG capsule Take 1 capsule (60 mg total) by mouth daily.  . ferrous sulfate (FERROUSUL) 325 (65 FE) MG tablet Take 650 mg by mouth daily with breakfast.   . flecainide (TAMBOCOR) 50 MG tablet Take 1 tablet (50 mg total) by mouth 2 (two) times daily.  [name] HYDROcodone-acetaminophen (NORCO/VICODIN) 5-325 MG tablet Take 1 tablet by mouth every 6 (six) hours as needed for moderate pain.  [name] levothyroxine (SYNTHROID) 25 MCG tablet Take 1 tablet (25 mcg total) by mouth daily before breakfast.  . lisinopril-hydrochlorothiazide (ZESTORETIC) 20-25 MG tablet Take 1 tablet by mouth daily.  [name] loratadine (CLARITIN) 10 MG tablet Take 10 mg by mouth daily as needed for allergies.   . OXYGEN Inhale into the lungs.  . pantoprazole (PROTONIX) 40 MG tablet Take 1 tablet (40 mg total) by mouth daily.  . potassium chloride SA (K-DUR,KLOR-CON) 20 MEQ tablet Take 1 tablet (20 mEq total) by mouth 2 (two) times daily. (Patient taking differently: Take 40 mEq  by mouth 2 (two) times daily. )  . sodium chloride (OCEAN) 0.65 % SOLN nasal spray Place 1 spray into both nostrils daily as needed for congestion.  . Tiotropium Bromide-Olodaterol (STIOLTO RESPIMAT) 2.5-2.5 MCG/ACT AERS Inhale 1 puff into the lungs daily.  . traZODone (DESYREL) 50 MG tablet Take 1 tablet (50 mg total) by mouth at bedtime.  . triamcinolone (KENALOG) 0.025 % cream Apply 1 application topically 2 (two) times daily.  . hydrALAZINE  (APRESOLINE) 25 MG tablet Take 1 tablet (25 mg total) by mouth 2 (two) times daily.   No facility-administered encounter medications on file as of 05/23/2019.     Surgical History: Past Surgical History:  Procedure Laterality Date  . ABDOMINAL AORTIC ANEURYSM REPAIR  2008  . ABDOMINAL HYSTERECTOMY    . AORTIC VALVE REPLACEMENT  2008  . CARDIAC CATHETERIZATION     [facility]  . CARDIOVERSION N/A 08/15/2018   Procedure: CARDIOVERSION;  Surgeon: [name], MD;  Location: [facility] ORS;  Service: Cardiovascular;  Laterality: N/A;  . CARDIOVERSION N/A 11/14/2018   Procedure: CARDIOVERSION (CATH LAB);  Surgeon: [name], MD;  Location: [facility] ORS;  Service: Cardiovascular;  Laterality: N/A;  . CARPAL TUNNEL RELEASE    . ELECTROPHYSIOLOGIC STUDY N/A 08/17/2016   Procedure: Cardioversion;  Surgeon: [name], MD;  Location: [facility] ORS;  Service: Cardiovascular;  Laterality: N/A;  . TUMOR EXCISION Left    x3 (arm)    Medical History: Past Medical History:  Diagnosis Date  . 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 s/p bioprosthetic valve replacement 2008 at [facility];  b. 01/2015 Echo: EF 60-65%, no rwma, Gr1 DD, mildly dil LA, nl RV fxn.  . Atrial fibrillation with RVR ([place]) 01/11/2015  . Atrial flutter ([place])    a. 08/2016 s/p DCCV.  Remains on flecainide 50 mg bid.  . Basal skull fracture ([place]) 20 yrs ago  . Chronic respiratory failure ([place])   . COPD (chronic obstructive pulmonary disease) ([place])    a. on home O2 at 2L since 2008  . 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 Essential hypertension 01/24/2015  . History of cardiac cath    a. 2008 prior to Aortic aneurysm repair-->nl cors.  . History of stress test    a. 10/2015 MV: no ischemia/infarct.  [name] HLD (hyperlipidemia)   . HTN (hypertension)   . Hypothyroidism   . Obesity   . PAF (paroxysmal atrial fibrillation) ([name])    a. on Eliquis; b. CHADS2VASc = 3 (HTN, age x 1, female).  . Paroxysmal atrial  fibrillation ([place]) 01/24/2015  . Right upper quadrant abdominal tenderness without rebound tenderness 03/02/2018  . S/P ascending aortic aneurysm repair 2008    Family History: Family History  Problem Relation Age of Onset  . Stroke Father   . Stroke Paternal Grandmother     Social History: Social History   Socioeconomic History  . Marital status: Single    Spouse name: Not on file  . Number of children: Not on file  . Years of education: Not on file  . Highest education level: Not on file  Occupational History  . Not on file  Social Needs  . Financial resource strain: Not on file  . Food insecurity    Worry: Not on file    Inability: Not on file  . Transportation needs    Medical: Not on file    Non-medical: Not on file  Tobacco Use  . Smoking status: Former Smoker    Types: Cigarettes  . Smokeless tobacco: Never Used  Substance and Sexual Activity  . Alcohol use: No  . Drug use: No  . Sexual activity: Never    Birth control/protection: Surgical  Lifestyle  . Physical activity    Days per week: Not on file    Minutes per session: Not on file  . Stress: Not on file  Relationships  . Social Herbalist on phone: Not on file    Gets together: Not on file    Attends religious service: Not on file    Active member of club or organization: Not on file    Attends meetings of clubs or organizations: Not on file    Relationship status: Not on file  . Intimate partner violence    Fear of current or ex partner: Not on file    Emotionally abused: Not on file    Physically abused: Not on file    Forced sexual activity: Not on file  Other Topics Concern  . Not on file  Social History Narrative  . Not on file    Vital Signs: Blood pressure (!) 142/74, pulse 85, resp. rate 18, height 4\' 10"  (1.473 m), weight 185 lb (83.9 kg), SpO2 (!) 86 %.  Examination: General Appearance: The patient is well-developed, well-nourished, and in no distress. Skin: Gross  inspection of skin unremarkable. Head: normocephalic, no gross deformities. Eyes: no gross deformities noted. ENT: ears appear grossly normal no exudates. Neck: Supple. No thyromegaly. No LAD. Respiratory: clear, diminished. Cardiovascular: Normal S1 and S2 without murmur or rub. Extremities: No cyanosis. pulses are equal. Neurologic: Alert and oriented. No involuntary movements.  LABS: Recent Results (from the past 2160 hour(s))  POCT Urine Drug Screen     Status: Abnormal   Collection Time: 04/06/19  3:54 PM  Result Value Ref Range   POC METHAMPHETAMINE UR None Detected None Detected   POC Opiate Ur None Detected None Detected   POC Barbiturate UR None Detected None Detected   POC Amphetamine UR None Detected None Detected   POC Oxycodone UR None Detected None Detected   POC Cocaine UR None Detected None Detected   POC Ecstasy UR None Detected None Detected   POC TRICYCLICS UR None Detected None Detected   POC PHENCYCLIDINE UR None Detected None Detected   POC MARIJUANA UR None Detected None Detected   POC METHADONE UR None Detected None Detected   POC BENZODIAZEPINES UR Positive (A) None Detected   URINE TEMPERATURE     POC DRUG SCREEN OXIDANTS URINE     POC SPECIFIC GRAVITY URINE     POC [place] URINE     Methylenedioxyamphetamine      Radiology: Dg Cervical Spine 2-3 Views  Result Date: 02/25/2019 CLINICAL DATA:  Posterior neck pain EXAM: CERVICAL SPINE - 2-3 VIEW COMPARISON:  04/23/2014 FINDINGS: Dens and lateral masses are within normal limits. Straightening of the cervical spine. Prominent degenerative changes C4-C5, C5-C6 and C6-C7. IMPRESSION: Straightening of the cervical spine with marked degenerative change C4 through C7. Electronically Signed   By: Donavan Foil M.D.   On: 02/25/2019 20:03    No results found.  No results found.    Assessment and Plan: Patient Active Problem List   Diagnosis Date Noted  . Atopic dermatitis 05/02/2019  . Paroxysmal atrial  flutter ([place]) 08/11/2018  . Encounter for long-term (current) use of medications 07/09/2018  . Chronic left shoulder  pain 07/09/2018  . Conjunctivitis 07/09/2018  . Oxygen dependent 07/09/2018  . GAD (generalized anxiety disorder) 07/09/2018  . Ovarian failure 07/09/2018  . Positive colorectal cancer screening using Cologuard test 04/24/2018  . Calculus of gallbladder with acute on chronic cholecystitis 04/08/2018  . H/O: CVA (cerebrovascular accident) 04/08/2018  . Dysuria 03/02/2018  . Urinary tract infection without hematuria 03/02/2018  . Right upper quadrant abdominal tenderness without rebound tenderness 03/02/2018  . 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 exacerbation ([place]) 03/02/2018  . Chronic obstructive pulmonary disease ([place]) 09/19/2017  . Typical atrial flutter ([place])   . Irritable bowel syndrome without diarrhea 01/29/2015  . Essential hypertension 01/24/2015  . Paroxysmal atrial fibrillation ([place]) 01/24/2015  . History of aortic valve replacement with bioprosthetic valve 01/24/2015  . Atrial fibrillation with RVR ([place]) 01/11/2015  . 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 Impingement syndrome of shoulder 05/18/2014  . Cellulitis and abscess 02/13/2014  . MRSA (methicillin resistant Staphylococcus aureus) 02/13/2014  . Aneurysm, ascending aorta ([place]) 06/23/2013  . Bicuspid aortic valve 06/23/2013    1. 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 exacerbation ([place]) Controlled, continue current medications.   2. OSA on CPAP Continue to use cpap as prescribed. Good relief of symptoms, pt is sleeping well.   3. Oxygen dependent Continue to use oxygen 2-3 lpm continuously   4. Essential hypertension Stable, slightly elevated today. 142/74, continue to monitor.   5. SOB (shortness of breath) - Spirometry with Graph -CT chest wo contrast.   General Counseling: I have discussed the findings of the evaluation and examination with Zaiyah.  I  have also discussed any further diagnostic evaluation thatmay be needed or ordered today. Leilanni verbalizes understanding of the findings of todays visit. We also reviewed her medications today and discussed drug interactions and side effects including but not limited excessive drowsiness and altered mental states. We also discussed that there is always a risk not just to her but also people around her. she has been encouraged to call the office with any questions or concerns that should arise related to todays visit.    Time spent: 15 This patient was seen by Orson Gear AGNP-C in Collaboration with Dr. Devona Konig as a part of collaborative care agreement.   I have personally obtained a history, examined the patient, evaluated laboratory and imaging results, formulated the assessment and plan and placed orders.    Allyne Gee, MD [facility] Pulmonary and Critical Care Sleep medicine

## 2019-05-24 ENCOUNTER — Ambulatory Visit
Admission: RE | Admit: 2019-05-24 | Discharge: 2019-05-24 | Disposition: A | Discharge status: Home / Self Care | Payer: Medicare Other | Source: Ambulatory Visit | Attending: Adult Health | Admitting: Adult Health

## 2019-05-24 ENCOUNTER — Other Ambulatory Visit: Payer: Self-pay

## 2019-05-24 ENCOUNTER — Other Ambulatory Visit: Payer: Self-pay | Admitting: Adult Health

## 2019-05-24 DIAGNOSIS — R0602 Shortness of breath: Secondary | ICD-10-CM

## 2019-05-24 DIAGNOSIS — R05 Cough: Secondary | ICD-10-CM | POA: Diagnosis not present

## 2019-05-27 ENCOUNTER — Telehealth: Payer: Self-pay | Admitting: Cardiovascular Disease

## 2019-05-27 NOTE — Telephone Encounter (Signed)
Cardiology Telephone Note  [name] called due to an "Afib attack".  She can feel her heart jumping and her breathing is slightly worse than baseline.  Her HR is 100-120, she has not taken her BP.  She is in no distress.  She reports she went to the ED recently for AF and was sent home without intervention.  She would like a DCCV.  I advised that she could try taking an extra diltiazem for rate control.  I also advised her to call the office Monday morning to discuss possible DCCV with her primary cardiologist.

## 2019-05-29 ENCOUNTER — Telehealth: Payer: Self-pay | Admitting: Cardiovascular Disease

## 2019-05-29 DIAGNOSIS — I480 Paroxysmal atrial fibrillation: Secondary | ICD-10-CM

## 2019-05-29 NOTE — Telephone Encounter (Addendum)
Spoke with the patient. Patient made aware of Dr.Arida's recommendation.  Patient is agreeable with the plan. Urgent ref placed to the [facility]. Spoke with Marzetta Board, RN @ the [facility], appt scheduled for 05/30/19 @ 2:30pm. Patient made aware of the appt date, time, and location. Provided the patient the directions, garage code, and  their telephone number.

## 2019-05-29 NOTE — Telephone Encounter (Signed)
Recommend referral to the A-fib clinic urgently.

## 2019-05-29 NOTE — Telephone Encounter (Addendum)
Spoke with the patient. Patient sts that she has been in AFIB Saturday. Her HR has been fluctuation between 120-130 bpm. Patient complains of increased sob and chest pressure. She is on O2 at home. Patient has not missed any doses of Eliquis.  BP this morning 102/84 HR 123bpm.  Adv the patient that I will fwd the message to Dr. Fletcher Anon and call back with his recommendation. Adv the patient to stay NPO until I am able to call her back this afternoon.  Patient is agreeable with the plan.

## 2019-05-29 NOTE — Telephone Encounter (Signed)
Patient c/o Palpitations:  High priority if patient c/o lightheadedness, shortness of breath, or chest pain  1) How long have you had palpitations/irregular HR/ Afib? Are you having the symptoms now? Started Saturday night .  Yes   2) Are you currently experiencing lightheadedness, SOB or CP? Interim lightheadedness and chest pressure sob   3) Do you have a history of afib (atrial fibrillation) or irregular heart rhythm? Yes and cardioversion   4) Have you checked your BP or HR? (document readings if available): HR 90-130 irregular   5) Are you experiencing any other symptoms?  Interim lightheadedness and chest pressure sob

## 2019-05-29 NOTE — Addendum Note (Signed)
Addended by: Lamar Laundry on: 05/29/2019 01:45 PM   Modules accepted: Orders

## 2019-05-30 ENCOUNTER — Encounter (HOSPITAL_COMMUNITY): Payer: Self-pay | Admitting: Nurse Practitioner

## 2019-05-30 ENCOUNTER — Other Ambulatory Visit: Payer: Self-pay

## 2019-05-30 ENCOUNTER — Ambulatory Visit (HOSPITAL_COMMUNITY)
Admission: RE | Admit: 2019-05-30 | Discharge: 2019-05-30 | Disposition: A | Discharge status: Home / Self Care | Payer: Medicare Other | Source: Ambulatory Visit | Attending: Nurse Practitioner | Admitting: Nurse Practitioner

## 2019-05-30 VITALS — BP 150/86 | HR 110 | Ht <= 58 in | Wt 187.6 lb

## 2019-05-30 DIAGNOSIS — I4891 Unspecified atrial fibrillation: Secondary | ICD-10-CM | POA: Diagnosis not present

## 2019-05-30 DIAGNOSIS — I480 Paroxysmal atrial fibrillation: Secondary | ICD-10-CM

## 2019-05-30 NOTE — Telephone Encounter (Signed)
Patient calling  Would like to know when the 1st time Dr Fletcher Anon shocked heart back into rhythm  Best to call before 1:15p to discuss

## 2019-05-30 NOTE — H&P (View-Only) (Signed)
Primary Care Physician: [name], MD Referring Physician: Dr. Marcella Dubs is a 73 y.o. female with a h/o COPD, dependant on O2 via [place], aortic stenosis, s/p AVR 2008 at [facility] [place] for years, started in 2016 per pt and managed fairly well on flecainide. She has had 3 cardioversions since then and the last one was in March 2020, December 2019 prior to that..   She was seen by Dr. [name] per virtual visit in April. He felt that she  was not an ablation candidate 2/2 her lung status and would not be able to tolerate general anesthesia. He also recommended decreasing flecainde to 50 mg bid, from 100 mg bid, as her qrs interval had widened and later thought it could be increased to 75 mg after pt's were being seen in the office again, for repeat EKG.  Unfortunately, pt went back into afib 9/19. She was referred   here for options. Pt states that she is miserable in afib and wants something done now.   She was given an option of going to the  [facility] ER for urgent cardioversion but they  currently have 100 patients in the ER and since she does not appear unstable, she may have to wait and the sister that drove her here did not like this option. We checked and the next scheduled cardioversion would be Monday at [facility].. She states that Dr. Fletcher Anon cardioverts on Monday and she would rather have in [place]. We also discussed changing to Tikosyn but would not be able to bring in for admission the earliest would be on  Monday and she would have to stop flecainide and HCTZ 3 days prior.to start of tikosyn and pt would need to check on price of drug to see if she can afford.   She is not very happy with any of these options. She was under the impression that I would be able to get her in back in rhythm today.   Today, she  Has  Symptoms + for palpitations,  , shortness of breath,  Negative  For orthopnea, PND, lower extremity edema, dizziness, presyncope, syncope, or neurologic sequela. The  patient is tolerating medications without difficulties and is otherwise without complaint today.   Past Medical History:  Diagnosis Date  . 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 s/p bioprosthetic valve replacement 2008 at [facility];  b. 01/2015 Echo: EF 60-65%, no rwma, Gr1 DD, mildly dil LA, nl RV fxn.  . Atrial fibrillation with RVR ([place]) 01/11/2015  . Atrial flutter ([place])    a. 08/2016 s/p DCCV.  Remains on flecainide 50 mg bid.  . Basal skull fracture ([place]) 20 yrs ago  . Chronic respiratory failure ([place])   . COPD (chronic obstructive pulmonary disease) ([place])    a. on home O2 at 2L since 2008  . 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 Essential hypertension 01/24/2015  . History of cardiac cath    a. 2008 prior to Aortic aneurysm repair-->nl cors.  . History of stress test    a. 10/2015 MV: no ischemia/infarct.  [name] HLD (hyperlipidemia)   . HTN (hypertension)   . Hypothyroidism   . Obesity   . PAF (paroxysmal atrial fibrillation) ([name])    a. on Eliquis; b. CHADS2VASc = 3 (HTN, age x 1, female).  . Paroxysmal atrial fibrillation ([place]) 01/24/2015  . Right upper quadrant abdominal tenderness without rebound tenderness  03/02/2018  . S/P ascending aortic aneurysm repair 2008   Past Surgical History:  Procedure Laterality Date  . ABDOMINAL AORTIC ANEURYSM REPAIR  2008  . ABDOMINAL HYSTERECTOMY    . AORTIC VALVE REPLACEMENT  2008  . CARDIAC CATHETERIZATION     [facility]  . CARDIOVERSION N/A 08/15/2018   Procedure: CARDIOVERSION;  Surgeon: [name], MD;  Location: [facility] ORS;  Service: Cardiovascular;  Laterality: N/A;  . CARDIOVERSION N/A 11/14/2018   Procedure: CARDIOVERSION (CATH LAB);  Surgeon: [name], MD;  Location: [facility] ORS;  Service: Cardiovascular;  Laterality: N/A;  . CARPAL TUNNEL RELEASE    . ELECTROPHYSIOLOGIC STUDY N/A 08/17/2016   Procedure: Cardioversion;  Surgeon: [name], MD;  Location: [facility] ORS;  Service: Cardiovascular;   Laterality: N/A;  . TUMOR EXCISION Left    x3 (arm)    Current Outpatient Medications  Medication Sig Dispense Refill  . acetaminophen (TYLENOL) 500 MG tablet Take 500 mg by mouth every 4 (four) hours as needed for moderate pain or headache.     . albuterol (PROVENTIL HFA;VENTOLIN HFA) 108 (90 Base) MCG/ACT inhaler Inhale 2 puffs into the lungs every 6 (six) hours as needed for wheezing or shortness of breath. 3 Inhaler 3  . ALPRAZolam (XANAX) 0.25 MG tablet Take 2 tablets (0.5 mg total) by mouth 2 (two) times daily as needed for anxiety. 60 tablet 3  . apixaban (ELIQUIS) 5 MG TABS tablet Take 1 tablet (5 mg total) by mouth 2 (two) times daily. 180 tablet 3  . Artificial Tear Solution (GENTEAL TEARS) 0.1-0.2-0.3 % SOLN Place 1 drop into both eyes daily.    [name] atorvastatin (LIPITOR) 40 MG tablet 1 tab po qhs 90 tablet 1  . Coenzyme Q10 (COQ10) 200 MG CAPS Take 200 mg by mouth daily.    . diclofenac sodium (VOLTAREN) 1 % GEL To the affected joint (Patient taking differently: Apply 2-4 g topically 3 (three) times daily as needed (shoulder pain.). To the affected joint ) 100 Tube 3  . diltiazem (CARDIZEM CD) 120 MG 24 hr capsule Take 1 capsule (120 mg total) by mouth daily. 90 capsule 3  . DULoxetine (CYMBALTA) 60 MG capsule Take 1 capsule (60 mg total) by mouth daily. 90 capsule 1  . ferrous sulfate (FERROUSUL) 325 (65 FE) MG tablet Take 650 mg by mouth daily with breakfast.     . flecainide (TAMBOCOR) 50 MG tablet Take 1 tablet (50 mg total) by mouth 2 (two) times daily. 60 tablet 3  . hydrALAZINE (APRESOLINE) 25 MG tablet Take 1 tablet (25 mg total) by mouth 2 (two) times daily. 180 tablet 2  . HYDROcodone-acetaminophen (NORCO/VICODIN) 5-325 MG tablet Take 1 tablet by mouth every 6 (six) hours as needed for moderate pain. 12 tablet 0  . levothyroxine (SYNTHROID) 25 MCG tablet Take 1 tablet (25 mcg total) by mouth daily before breakfast. 90 tablet 1  . lisinopril-hydrochlorothiazide (ZESTORETIC)  20-25 MG tablet Take 1 tablet by mouth daily. 90 tablet 0  . loratadine (CLARITIN) 10 MG tablet Take 10 mg by mouth daily as needed for allergies.     . OXYGEN Inhale into the lungs.    . pantoprazole (PROTONIX) 40 MG tablet Take 1 tablet (40 mg total) by mouth daily. 90 tablet 1  . potassium chloride SA (K-DUR,KLOR-CON) 20 MEQ tablet Take 1 tablet (20 mEq total) by mouth 2 (two) times daily. (Patient taking differently: Take 40 mEq by mouth 2 (two) times daily. ) 180 tablet 3  .  sodium chloride (OCEAN) 0.65 % SOLN nasal spray Place 1 spray into both nostrils daily as needed for congestion.    . Tiotropium Bromide-Olodaterol (STIOLTO RESPIMAT) 2.5-2.5 MCG/ACT AERS Inhale 1 puff into the lungs daily.    . traZODone (DESYREL) 50 MG tablet Take 1 tablet (50 mg total) by mouth at bedtime. 90 tablet 1  . triamcinolone (KENALOG) 0.025 % cream Apply 1 application topically 2 (two) times daily. 80 g 2   No current facility-administered medications for this encounter.     Allergies  Allergen Reactions  . Benadryl [Diphenhydramine Hcl (Sleep)] Palpitations  . Cetirizine Palpitations  . Diphenhydramine-Zinc Acetate Palpitations  . Lasix [Furosemide] Rash  . Levaquin [Levofloxacin In D5w] Other (See Comments)    Reaction:  Fatigue and muscle soreness  . Levofloxacin Other (See Comments)    Fatigue, muscle soreness  . Meloxicam Rash  . Soy Allergy Hives and Nausea And Vomiting  . Sulfa Antibiotics Rash    Social History   Socioeconomic History  . Marital status: Single    Spouse name: Not on file  . Number of children: Not on file  . Years of education: Not on file  . Highest education level: Not on file  Occupational History  . Not on file  Social Needs  . Financial resource strain: Not on file  . Food insecurity    Worry: Not on file    Inability: Not on file  . Transportation needs    Medical: Not on file    Non-medical: Not on file  Tobacco Use  . Smoking status: Former Smoker     Types: Cigarettes  . Smokeless tobacco: Never Used  Substance and Sexual Activity  . Alcohol use: No  . Drug use: No  . Sexual activity: Never    Birth control/protection: Surgical  Lifestyle  . Physical activity    Days per week: Not on file    Minutes per session: Not on file  . Stress: Not on file  Relationships  . Social Herbalist on phone: Not on file    Gets together: Not on file    Attends religious service: Not on file    Active member of club or organization: Not on file    Attends meetings of clubs or organizations: Not on file    Relationship status: Not on file  . Intimate partner violence    Fear of current or ex partner: Not on file    Emotionally abused: Not on file    Physically abused: Not on file    Forced sexual activity: Not on file  Other Topics Concern  . Not on file  Social History Narrative  . Not on file    Family History  Problem Relation Age of Onset  . Stroke Father   . Stroke Paternal Grandmother     ROS- All systems are reviewed and negative except as per the HPI above  Physical Exam: Vitals:   05/30/19 1433  BP: (!) 150/86  Pulse: (!) 110  Weight: 85.1 kg  Height: 4\' 10"  (1.473 m)   Wt Readings from Last 3 Encounters:  05/30/19 85.1 kg  05/23/19 83.9 kg  05/18/19 84.3 kg    Labs: Lab Results  Component Value Date   NA 134 (L) 10/31/2018   K 3.5 10/31/2018   CL 100 10/31/2018   CO2 26 10/31/2018   GLUCOSE 107 (H) 10/31/2018   BUN 10 10/31/2018   CREATININE 0.61 10/31/2018   CALCIUM 9.0  10/31/2018   MG 2.0 10/31/2018   Lab Results  Component Value Date   INR 1.19 08/13/2016   No results found for: CHOL, HDL, LDLCALC, TRIG   GEN- The patient is well appearing, alert and oriented x 3 today.   Head- normocephalic, atraumatic Eyes-  Sclera clear, conjunctiva pink Ears- hearing intact Oropharynx- clear Neck- supple, no JVP Lymph- no cervical lymphadenopathy Lungs- Clear to ausculation bilaterally,  normal work of breathing Heart- Regular rate and rhythm, no murmurs, rubs or gallops, PMI not laterally displaced GI- soft, NT, ND, + BS Extremities- no clubbing, cyanosis, or edema MS- no significant deformity or atrophy Skin- no rash or lesion Psych- euthymic mood, full affect Neuro- strength and sensation are intact  EKG- Afib at 110 bpm Echo-Study Conclusions  - Left ventricle: The cavity size was normal. There was moderate   concentric hypertrophy. Systolic function was normal. The   estimated ejection fraction was in the range of 60% to 65%. Wall   motion was normal; there were no regional wall motion   abnormalities. Doppler parameters are consistent with abnormal   left ventricular relaxation (grade 1 diastolic dysfunction). - Mitral valve: There was mild regurgitation. - Left atrium: The atrium was mildly dilated. - Right ventricle: Systolic function was normal. - Pulmonary arteries: Systolic pressure was within the normal   range.  Impressions:  - Rhythm is normal sinus    Assessment and Plan: 1. Persistent  afib since 9/19 DCCV in 08/2018 and again in 11/2018 She was offered to go to [facility] ER for urgent cardioversion, as she wanted something done today,  but she may have to wait  for several hours  as the ER census is up, or cardioversion scheduled in [place] on Monday( soonest available) She would rather have in [place] as Dr. Fletcher Anon cardioverts on Monday, pr pt  and she will not have to come back up here from [place].  We also discussed that I could put her in the hospital on Monday for change in antiarrythmic to  loading of tikosyn, but she would have to stop flecainde and hctz 3 days prior, She will have to check on the price of the drug ot see if she can afford. She may be interested in this later but rather have a DCCV first.  She will let me know when she is interested in [place] admit. In the interim will message Dr. Tyrell Antonio office to let them know of her  wishes re DCCV in [place]  She states that has not missed any eliquis 5 mg bid for the last 3 weeks She is not thought to be an ablation candidate  I will not increase diltiazem  to 120 bid as she had bradycardia after last cardioversion  afib clinic as needed  Butch Penny C. Gunner [place], [facility] [address] [place], [place] [address] [phone]

## 2019-05-30 NOTE — Progress Notes (Signed)
Primary Care Physician: [name], MD Referring Physician: Dr. Marcella Dubs is a 73 y.o. female with a h/o COPD, dependant on O2 via [place], aortic stenosis, s/p AVR 2008 at [facility], [place] for years, started in 2016 per pt and managed fairly well on flecainide. She has had 3 cardioversions since then and the last one was in March 2020, December 2019 prior to that..   She was seen by Dr. [name] per virtual visit in April. He felt that she  was not an ablation candidate 2/2 her lung status and would not be able to tolerate general anesthesia. He also recommended decreasing flecainde to 50 mg bid, from 100 mg bid, as her qrs interval had widened and later thought it could be increased to 75 mg after pt's were being seen in the office again, for repeat EKG.  Unfortunately, pt went back into afib 9/19. She was referred   here for options. Pt states that she is miserable in afib and wants something done now.   She was given an option of going to the  [facility] ER for urgent cardioversion but they  currently have 100 patients in the ER and since she does not appear unstable, she may have to wait and the sister that drove her here did not like this option. We checked and the next scheduled cardioversion would be Monday at [facility].. She states that Dr. Fletcher Anon cardioverts on Monday and she would rather have in [place]. We also discussed changing to Tikosyn but would not be able to bring in for admission the earliest would be on  Monday and she would have to stop flecainide and HCTZ 3 days prior.to start of tikosyn and pt would need to check on price of drug to see if she can afford.   She is not very happy with any of these options. She was under the impression that I would be able to get her in back in rhythm today.   Today, she  Has  Symptoms + for palpitations,  , shortness of breath,  Negative  For orthopnea, PND, lower extremity edema, dizziness, presyncope, syncope, or neurologic sequela. The  patient is tolerating medications without difficulties and is otherwise without complaint today.   Past Medical History:  Diagnosis Date  . 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 s/p bioprosthetic valve replacement 2008 at [facility];  b. 01/2015 Echo: EF 60-65%, no rwma, Gr1 DD, mildly dil LA, nl RV fxn.  . Atrial fibrillation with RVR ([place]) 01/11/2015  . Atrial flutter ([place])    a. 08/2016 s/p DCCV.  Remains on flecainide 50 mg bid.  . Basal skull fracture ([place]) 20 yrs ago  . Chronic respiratory failure ([place])   . COPD (chronic obstructive pulmonary disease) ([place])    a. on home O2 at 2L since 2008  . 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 Essential hypertension 01/24/2015  . History of cardiac cath    a. 2008 prior to Aortic aneurysm repair-->nl cors.  . History of stress test    a. 10/2015 MV: no ischemia/infarct.  [name] HLD (hyperlipidemia)   . HTN (hypertension)   . Hypothyroidism   . Obesity   . PAF (paroxysmal atrial fibrillation) ([name])    a. on Eliquis; b. CHADS2VASc = 3 (HTN, age x 1, female).  . Paroxysmal atrial fibrillation ([place]) 01/24/2015  . Right upper quadrant abdominal tenderness without rebound tenderness  03/02/2018  . S/P ascending aortic aneurysm repair 2008   Past Surgical History:  Procedure Laterality Date  . ABDOMINAL AORTIC ANEURYSM REPAIR  2008  . ABDOMINAL HYSTERECTOMY    . AORTIC VALVE REPLACEMENT  2008  . CARDIAC CATHETERIZATION     [facility]  . CARDIOVERSION N/A 08/15/2018   Procedure: CARDIOVERSION;  Surgeon: [name], MD;  Location: [facility] ORS;  Service: Cardiovascular;  Laterality: N/A;  . CARDIOVERSION N/A 11/14/2018   Procedure: CARDIOVERSION (CATH LAB);  Surgeon: [name], MD;  Location: [facility] ORS;  Service: Cardiovascular;  Laterality: N/A;  . CARPAL TUNNEL RELEASE    . ELECTROPHYSIOLOGIC STUDY N/A 08/17/2016   Procedure: Cardioversion;  Surgeon: [name], MD;  Location: [facility] ORS;  Service: Cardiovascular;   Laterality: N/A;  . TUMOR EXCISION Left    x3 (arm)    Current Outpatient Medications  Medication Sig Dispense Refill  . acetaminophen (TYLENOL) 500 MG tablet Take 500 mg by mouth every 4 (four) hours as needed for moderate pain or headache.     . albuterol (PROVENTIL HFA;VENTOLIN HFA) 108 (90 Base) MCG/ACT inhaler Inhale 2 puffs into the lungs every 6 (six) hours as needed for wheezing or shortness of breath. 3 Inhaler 3  . ALPRAZolam (XANAX) 0.25 MG tablet Take 2 tablets (0.5 mg total) by mouth 2 (two) times daily as needed for anxiety. 60 tablet 3  . apixaban (ELIQUIS) 5 MG TABS tablet Take 1 tablet (5 mg total) by mouth 2 (two) times daily. 180 tablet 3  . Artificial Tear Solution (GENTEAL TEARS) 0.1-0.2-0.3 % SOLN Place 1 drop into both eyes daily.    [name] atorvastatin (LIPITOR) 40 MG tablet 1 tab po qhs 90 tablet 1  . Coenzyme Q10 (COQ10) 200 MG CAPS Take 200 mg by mouth daily.    . diclofenac sodium (VOLTAREN) 1 % GEL To the affected joint (Patient taking differently: Apply 2-4 g topically 3 (three) times daily as needed (shoulder pain.). To the affected joint ) 100 Tube 3  . diltiazem (CARDIZEM CD) 120 MG 24 hr capsule Take 1 capsule (120 mg total) by mouth daily. 90 capsule 3  . DULoxetine (CYMBALTA) 60 MG capsule Take 1 capsule (60 mg total) by mouth daily. 90 capsule 1  . ferrous sulfate (FERROUSUL) 325 (65 FE) MG tablet Take 650 mg by mouth daily with breakfast.     . flecainide (TAMBOCOR) 50 MG tablet Take 1 tablet (50 mg total) by mouth 2 (two) times daily. 60 tablet 3  . hydrALAZINE (APRESOLINE) 25 MG tablet Take 1 tablet (25 mg total) by mouth 2 (two) times daily. 180 tablet 2  . HYDROcodone-acetaminophen (NORCO/VICODIN) 5-325 MG tablet Take 1 tablet by mouth every 6 (six) hours as needed for moderate pain. 12 tablet 0  . levothyroxine (SYNTHROID) 25 MCG tablet Take 1 tablet (25 mcg total) by mouth daily before breakfast. 90 tablet 1  . lisinopril-hydrochlorothiazide (ZESTORETIC)  20-25 MG tablet Take 1 tablet by mouth daily. 90 tablet 0  . loratadine (CLARITIN) 10 MG tablet Take 10 mg by mouth daily as needed for allergies.     . OXYGEN Inhale into the lungs.    . pantoprazole (PROTONIX) 40 MG tablet Take 1 tablet (40 mg total) by mouth daily. 90 tablet 1  . potassium chloride SA (K-DUR,KLOR-CON) 20 MEQ tablet Take 1 tablet (20 mEq total) by mouth 2 (two) times daily. (Patient taking differently: Take 40 mEq by mouth 2 (two) times daily. ) 180 tablet 3  .  sodium chloride (OCEAN) 0.65 % SOLN nasal spray Place 1 spray into both nostrils daily as needed for congestion.    . Tiotropium Bromide-Olodaterol (STIOLTO RESPIMAT) 2.5-2.5 MCG/ACT AERS Inhale 1 puff into the lungs daily.    . traZODone (DESYREL) 50 MG tablet Take 1 tablet (50 mg total) by mouth at bedtime. 90 tablet 1  . triamcinolone (KENALOG) 0.025 % cream Apply 1 application topically 2 (two) times daily. 80 g 2   No current facility-administered medications for this encounter.     Allergies  Allergen Reactions  . Benadryl [Diphenhydramine Hcl (Sleep)] Palpitations  . Cetirizine Palpitations  . Diphenhydramine-Zinc Acetate Palpitations  . Lasix [Furosemide] Rash  . Levaquin [Levofloxacin In D5w] Other (See Comments)    Reaction:  Fatigue and muscle soreness  . Levofloxacin Other (See Comments)    Fatigue, muscle soreness  . Meloxicam Rash  . Soy Allergy Hives and Nausea And Vomiting  . Sulfa Antibiotics Rash    Social History   Socioeconomic History  . Marital status: Single    Spouse name: Not on file  . Number of children: Not on file  . Years of education: Not on file  . Highest education level: Not on file  Occupational History  . Not on file  Social Needs  . Financial resource strain: Not on file  . Food insecurity    Worry: Not on file    Inability: Not on file  . Transportation needs    Medical: Not on file    Non-medical: Not on file  Tobacco Use  . Smoking status: Former Smoker     Types: Cigarettes  . Smokeless tobacco: Never Used  Substance and Sexual Activity  . Alcohol use: No  . Drug use: No  . Sexual activity: Never    Birth control/protection: Surgical  Lifestyle  . Physical activity    Days per week: Not on file    Minutes per session: Not on file  . Stress: Not on file  Relationships  . Social Herbalist on phone: Not on file    Gets together: Not on file    Attends religious service: Not on file    Active member of club or organization: Not on file    Attends meetings of clubs or organizations: Not on file    Relationship status: Not on file  . Intimate partner violence    Fear of current or ex partner: Not on file    Emotionally abused: Not on file    Physically abused: Not on file    Forced sexual activity: Not on file  Other Topics Concern  . Not on file  Social History Narrative  . Not on file    Family History  Problem Relation Age of Onset  . Stroke Father   . Stroke Paternal Grandmother     ROS- All systems are reviewed and negative except as per the HPI above  Physical Exam: Vitals:   05/30/19 1433  BP: (!) 150/86  Pulse: (!) 110  Weight: 85.1 kg  Height: 4\' 10"  (1.473 m)   Wt Readings from Last 3 Encounters:  05/30/19 85.1 kg  05/23/19 83.9 kg  05/18/19 84.3 kg    Labs: Lab Results  Component Value Date   NA 134 (L) 10/31/2018   K 3.5 10/31/2018   CL 100 10/31/2018   CO2 26 10/31/2018   GLUCOSE 107 (H) 10/31/2018   BUN 10 10/31/2018   CREATININE 0.61 10/31/2018   CALCIUM 9.0  10/31/2018   MG 2.0 10/31/2018   Lab Results  Component Value Date   INR 1.19 08/13/2016   No results found for: CHOL, HDL, LDLCALC, TRIG   GEN- The patient is well appearing, alert and oriented x 3 today.   Head- normocephalic, atraumatic Eyes-  Sclera clear, conjunctiva pink Ears- hearing intact Oropharynx- clear Neck- supple, no JVP Lymph- no cervical lymphadenopathy Lungs- Clear to ausculation bilaterally,  normal work of breathing Heart- Regular rate and rhythm, no murmurs, rubs or gallops, PMI not laterally displaced GI- soft, NT, ND, + BS Extremities- no clubbing, cyanosis, or edema MS- no significant deformity or atrophy Skin- no rash or lesion Psych- euthymic mood, full affect Neuro- strength and sensation are intact  EKG- Afib at 110 bpm Echo-Study Conclusions  - Left ventricle: The cavity size was normal. There was moderate   concentric hypertrophy. Systolic function was normal. The   estimated ejection fraction was in the range of 60% to 65%. Wall   motion was normal; there were no regional wall motion   abnormalities. Doppler parameters are consistent with abnormal   left ventricular relaxation (grade 1 diastolic dysfunction). - Mitral valve: There was mild regurgitation. - Left atrium: The atrium was mildly dilated. - Right ventricle: Systolic function was normal. - Pulmonary arteries: Systolic pressure was within the normal   range.  Impressions:  - Rhythm is normal sinus    Assessment and Plan: 1. Persistent  afib since 9/19 DCCV in 08/2018 and again in 11/2018 She was offered to go to [facility] ER for urgent cardioversion, as she wanted something done today,  but she may have to wait  for several hours  as the ER census is up, or cardioversion scheduled in [place] on Monday( soonest available) She would rather have in [place] as Dr. Fletcher Anon cardioverts on Monday, pr pt  and she will not have to come back up here from [place].  We also discussed that I could put her in the hospital on Monday for change in antiarrythmic to  loading of tikosyn, but she would have to stop flecainde and hctz 3 days prior, She will have to check on the price of the drug ot see if she can afford. She may be interested in this later but rather have a DCCV first.  She will let me know when she is interested in [place] admit. In the interim will message Dr. Tyrell Antonio office to let them know of her  wishes re DCCV in [place]  She states that has not missed any eliquis 5 mg bid for the last 3 weeks She is not thought to be an ablation candidate  I will not increase diltiazem  to 120 bid as she had bradycardia after last cardioversion  afib clinic as needed  Butch Penny C. Alyanah Elliott, [facility] [address] [place], [place] [address] [phone]

## 2019-05-30 NOTE — Telephone Encounter (Signed)
Spoke with the patient. Adv her that after looking through her chart it looks like her first dccv with Dr. Fletcher Anon was on 08/17/2016. Patient verbalized understanding and voiced appreciation for the call.

## 2019-05-31 ENCOUNTER — Telehealth: Payer: Self-pay

## 2019-05-31 DIAGNOSIS — I480 Paroxysmal atrial fibrillation: Secondary | ICD-10-CM

## 2019-05-31 NOTE — Telephone Encounter (Addendum)
Called the patient to give her instructions for her 06/05/19 dccv scheduled with Dr. Fletcher Anon. lmtcb.

## 2019-05-31 NOTE — Telephone Encounter (Signed)
-----   Message from [name], MD sent at 05/30/2019  4:49 PM EDT ----- She was seen in the A. fib clinic.  Cardioversion was recommended but the patient wants to have it done here at [facility].  I can do on Monday, September 28.  ----- Message ----- From: Sherran Needs, NP Sent: 05/30/2019   4:22 PM EDT To: [name], MD

## 2019-05-31 NOTE — Telephone Encounter (Deleted)
Called the patient to discuss dc

## 2019-05-31 NOTE — Telephone Encounter (Signed)
Spoke with the patient. Patient made of her procedure date ant time. Dccv is scheduled with Dr. Fletcher Anon on 06/05/19 @ 7:30am @ [place] Patient advised to arrive at the medical mall @ 7am on the day of the procedure  - NPO after midnight - Morning medication with a sip of water, do not miss any doses of Eliquis - Labwork tomorrow 06/01/19 (Bmet, Cbc) at the [place] test tomorrow 06/01/19 between 12:30-2:30pm at the [facility] drive through test site - Patient adv that on the day of the procedure she will need someone to drive her home.  Patient verbalized understanding to the information given.

## 2019-06-01 ENCOUNTER — Other Ambulatory Visit: Payer: Self-pay

## 2019-06-01 ENCOUNTER — Other Ambulatory Visit
Admission: RE | Admit: 2019-06-01 | Discharge: 2019-06-01 | Disposition: A | Discharge status: Home / Self Care | Payer: Medicare Other | Source: Ambulatory Visit | Attending: Cardiovascular Disease | Admitting: Cardiovascular Disease

## 2019-06-01 ENCOUNTER — Ambulatory Visit: Payer: Medicare Other

## 2019-06-01 DIAGNOSIS — I480 Paroxysmal atrial fibrillation: Secondary | ICD-10-CM | POA: Diagnosis not present

## 2019-06-01 DIAGNOSIS — Z20828 Contact with and (suspected) exposure to other viral communicable diseases: Secondary | ICD-10-CM | POA: Diagnosis not present

## 2019-06-01 DIAGNOSIS — Z01812 Encounter for preprocedural laboratory examination: Secondary | ICD-10-CM | POA: Insufficient documentation

## 2019-06-01 LAB — BASIC METABOLIC PANEL
Anion gap: 10 (ref 5–15)
BUN: 14 mg/dL (ref 8–23)
CO2: 25 mmol/L (ref 22–32)
Calcium: 9.2 mg/dL (ref 8.9–10.3)
Chloride: 99 mmol/L (ref 98–111)
Creatinine, Ser: 0.67 mg/dL (ref 0.44–1.00)
GFR calc Af Amer: >60 mL/min (ref >60)
GFR calc non Af Amer: >60 mL/min (ref >60)
Glucose, Bld: 119 mg/dL — ABNORMAL HIGH (ref 70–99)
Potassium: 3.6 mmol/L (ref 3.5–5.1)
Sodium: 134 mmol/L — ABNORMAL LOW (ref 135–145)

## 2019-06-01 LAB — CBC WITH DIFFERENTIAL/PLATELET
Abs Immature Granulocytes: 0.03 10*3/uL (ref 0.00–0.07)
Basophils Absolute: 0 10*3/uL (ref 0.0–0.1)
Basophils Relative: 1 %
Eosinophils Absolute: 0.1 10*3/uL (ref 0.0–0.5)
Eosinophils Relative: 1 %
HCT: 34.6 % — ABNORMAL LOW (ref 36.0–46.0)
Hemoglobin: 10.9 g/dL — ABNORMAL LOW (ref 12.0–15.0)
Immature Granulocytes: 1 %
Lymphocytes Relative: 17 %
Lymphs Abs: 1.1 10*3/uL (ref 0.7–4.0)
MCH: 26.2 pg (ref 26.0–34.0)
MCHC: 31.5 g/dL (ref 30.0–36.0)
MCV: 83.2 fL (ref 80.0–100.0)
Monocytes Absolute: 0.8 10*3/uL (ref 0.1–1.0)
Monocytes Relative: 13 %
Neutro Abs: 4.5 10*3/uL (ref 1.7–7.7)
Neutrophils Relative %: 67 %
Platelets: 307 10*3/uL (ref 150–400)
RBC: 4.16 MIL/uL (ref 3.87–5.11)
RDW: 17.2 % — ABNORMAL HIGH (ref 11.5–15.5)
WBC: 6.6 10*3/uL (ref 4.0–10.5)
nRBC: 0 % (ref 0.0–0.2)

## 2019-06-01 NOTE — Telephone Encounter (Signed)
Spoke with the patient. Patient wanted to know whether she needed to have her labwork at a certain time today. Adv the patient that she did not need to have an appt for lab. She will need to have her labs drawn prior to her COVID test that is scheduled between 12:30-2:30pm. Patient verbalized understanding. No further questions at this time.

## 2019-06-01 NOTE — Telephone Encounter (Signed)
Patient wished to clarify instructions Patient made aware that lab work should be done prior to COVID test and COVID test will be between 12:30p-2:30p Patient verbalized understanding

## 2019-06-01 NOTE — Telephone Encounter (Signed)
Returned the patient's call lmtcb if assistance is still needed.

## 2019-06-02 LAB — SARS CORONAVIRUS 2 (TAT 6-24 HRS): SARS Coronavirus 2: NEGATIVE

## 2019-06-03 DIAGNOSIS — J449 Chronic obstructive pulmonary disease, unspecified: Secondary | ICD-10-CM | POA: Diagnosis not present

## 2019-06-05 ENCOUNTER — Ambulatory Visit: Payer: Medicare Other | Admitting: Anesthesiology

## 2019-06-05 ENCOUNTER — Encounter: Payer: Self-pay | Admitting: *Deleted

## 2019-06-05 ENCOUNTER — Telehealth: Payer: Self-pay | Admitting: Cardiovascular Disease

## 2019-06-05 ENCOUNTER — Encounter
Admission: RE | Disposition: A | Discharge status: Home / Self Care | Payer: Self-pay | Source: Home / Self Care | Attending: Cardiovascular Disease

## 2019-06-05 ENCOUNTER — Ambulatory Visit
Admission: RE | Admit: 2019-06-05 | Discharge: 2019-06-05 | Disposition: A | Discharge status: Home / Self Care | Payer: Medicare Other | Attending: Cardiovascular Disease | Admitting: Cardiovascular Disease

## 2019-06-05 ENCOUNTER — Other Ambulatory Visit: Payer: Self-pay

## 2019-06-05 DIAGNOSIS — G4730 Sleep apnea, unspecified: Secondary | ICD-10-CM | POA: Insufficient documentation

## 2019-06-05 DIAGNOSIS — Z888 Allergy status to other drugs, medicaments and biological substances status: Secondary | ICD-10-CM | POA: Insufficient documentation

## 2019-06-05 DIAGNOSIS — I480 Paroxysmal atrial fibrillation: Secondary | ICD-10-CM | POA: Insufficient documentation

## 2019-06-05 DIAGNOSIS — Z952 Presence of prosthetic heart valve: Secondary | ICD-10-CM | POA: Diagnosis not present

## 2019-06-05 DIAGNOSIS — E669 Obesity, unspecified: Secondary | ICD-10-CM | POA: Insufficient documentation

## 2019-06-05 DIAGNOSIS — Z8673 Personal history of transient ischemic attack (TIA), and cerebral infarction without residual deficits: Secondary | ICD-10-CM | POA: Diagnosis not present

## 2019-06-05 DIAGNOSIS — Z9981 Dependence on supplemental oxygen: Secondary | ICD-10-CM | POA: Insufficient documentation

## 2019-06-05 DIAGNOSIS — I483 Typical atrial flutter: Secondary | ICD-10-CM

## 2019-06-05 DIAGNOSIS — Z87891 Personal history of nicotine dependence: Secondary | ICD-10-CM | POA: Insufficient documentation

## 2019-06-05 DIAGNOSIS — Z881 Allergy status to other antibiotic agents status: Secondary | ICD-10-CM | POA: Insufficient documentation

## 2019-06-05 DIAGNOSIS — E785 Hyperlipidemia, unspecified: Secondary | ICD-10-CM | POA: Diagnosis not present

## 2019-06-05 DIAGNOSIS — E039 Hypothyroidism, unspecified: Secondary | ICD-10-CM | POA: Diagnosis not present

## 2019-06-05 DIAGNOSIS — I10 Essential (primary) hypertension: Secondary | ICD-10-CM | POA: Insufficient documentation

## 2019-06-05 DIAGNOSIS — I4892 Unspecified atrial flutter: Secondary | ICD-10-CM | POA: Insufficient documentation

## 2019-06-05 DIAGNOSIS — Z79890 Hormone replacement therapy (postmenopausal): Secondary | ICD-10-CM | POA: Diagnosis not present

## 2019-06-05 DIAGNOSIS — Z7901 Long term (current) use of anticoagulants: Secondary | ICD-10-CM | POA: Insufficient documentation

## 2019-06-05 DIAGNOSIS — F419 Anxiety disorder, unspecified: Secondary | ICD-10-CM | POA: Diagnosis not present

## 2019-06-05 DIAGNOSIS — Z6839 Body mass index (BMI) 39.0-39.9, adult: Secondary | ICD-10-CM | POA: Insufficient documentation

## 2019-06-05 DIAGNOSIS — J449 Chronic obstructive pulmonary disease, unspecified: Secondary | ICD-10-CM | POA: Diagnosis not present

## 2019-06-05 DIAGNOSIS — Z882 Allergy status to sulfonamides status: Secondary | ICD-10-CM | POA: Diagnosis not present

## 2019-06-05 HISTORY — PX: CARDIOVERSION: SHX1299

## 2019-06-05 SURGERY — CARDIOVERSION
Anesthesia: General

## 2019-06-05 MED ORDER — PROPOFOL 10 MG/ML IV BOLUS
INTRAVENOUS | Status: DC | PRN
  Administered 2019-06-05: 60 mg via INTRAVENOUS

## 2019-06-05 MED ORDER — SODIUM CHLORIDE 0.9 % IV SOLN
INTRAVENOUS | Status: DC | PRN
  Administered 2019-06-05: 07:00:00 via INTRAVENOUS

## 2019-06-05 NOTE — Interval H&P Note (Signed)
History and Physical Interval Note:  06/05/2019 8:06 AM  [name]  has presented today for surgery, with the diagnosis of Cardioversion   Afib.  The various methods of treatment have been discussed with the patient and family. After consideration of risks, benefits and other options for treatment, the patient has consented to  Procedure(s): CARDIOVERSION (N/A) as a surgical intervention.  The patient's history has been reviewed, patient examined, no change in status, stable for surgery.  I have reviewed the patient's chart and labs.  Questions were answered to the patient's satisfaction.     [name]

## 2019-06-05 NOTE — Anesthesia Post-op Follow-up Note (Signed)
Anesthesia QCDR form completed.        

## 2019-06-05 NOTE — Telephone Encounter (Signed)
Spoke with the patient. Patient sts that once she arrived home after her dccv today she began to experience a "thumping" in her head. It prompted her to check her BP. Patient reports 157/93 HR 101. Patient denies chest pain, increased sob, dizziness, light headiness. Patient sts that she did take all of her prescribed medications this morning prior to her procedure.  Adv the patient that I will fwd the update to Dr. Fletcher Anon and call back with his recommendation. Patient agreeable wit the plan.

## 2019-06-05 NOTE — Telephone Encounter (Signed)
Patient made aware of Dr. Arida's recommendation with verbalized understanding. °

## 2019-06-05 NOTE — Telephone Encounter (Signed)
She can take an extra dose of Diltiazem today and monitor symptoms.

## 2019-06-05 NOTE — CV Procedure (Signed)
Cardioversion note: A standard informed consent was obtained. Timeout was performed. The pads were placed in the anterior posterior fashion. The patient was given propofol by the anesthesia team.  Successful cardioversion was performed with a 100 J. The patient converted to sinus rhythm.  There was at least 6-second pause post cardioversion. Pre-and post EKGs were reviewed. The patient tolerated the procedure with no immediate complications.  Recommendations: Continue same medications and follow-up in 2-3 weeks. Preprocedure EKG is consistent with atrial flutter. Given that most of her recent atrial arrhythmias are due to atrial flutter, consider reevaluation for ablation.

## 2019-06-05 NOTE — Telephone Encounter (Signed)
Called to give the patient Dr. Tyrell Antonio recommendation. lmtcb.

## 2019-06-05 NOTE — Anesthesia Preprocedure Evaluation (Addendum)
Anesthesia Evaluation  Patient identified by MRN, date of birth, ID band Patient awake    Reviewed: Allergy & Precautions, NPO status , Patient's Chart, lab work & pertinent test results  History of Anesthesia Complications Negative for: history of anesthetic complications  Airway Mallampati: III  TM Distance: >3 FB Neck ROM: Full    Dental no notable dental hx.    Pulmonary sleep apnea and Continuous Positive Airway Pressure Ventilation , COPD,  oxygen dependent, former smoker,    breath sounds clear to auscultation- rhonchi (-) wheezing      Cardiovascular hypertension, (-) CAD, (-) Past MI, (-) Cardiac Stents and (-) CABG + dysrhythmias Atrial Fibrillation + Valvular Problems/Murmurs (s/p AVR 2008)  Rhythm:Irregular Rate:Normal - Systolic murmurs and - Diastolic murmurs    Neuro/Psych PSYCHIATRIC DISORDERS Anxiety CVA, No Residual Symptoms    GI/Hepatic negative GI ROS, Neg liver ROS,   Endo/Other  neg diabetesHypothyroidism   Renal/GU negative Renal ROS     Musculoskeletal  (+) Arthritis ,   Abdominal (+) + obese,   Peds  Hematology negative hematology ROS (+)   Anesthesia Other Findings Past Medical History: No date: Aortic stenosis due to bicuspid aortic valve     Comment:  a. s/p bioprosthetic valve replacement 2008 at [facility];  b.              01/2015 Echo: EF 60-65%, no rwma, Gr1 DD, mildly dil LA,               nl RV fxn. 01/11/2015: Atrial fibrillation with RVR ([place]) No date: Atrial flutter ([place])     Comment:  a. 08/2016 s/p DCCV.  Remains on flecainide 50 mg bid. 20 yrs ago: Basal skull fracture ([name]) No date: Chronic respiratory failure ([name]) No date: COPD (chronic obstructive pulmonary disease) ([place])     Comment:  a. 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 2008 prior to Aortic aneurysm repair-->nl cors. No date: History of stress test     Comment:  a. 10/2015 MV: no ischemia/infarct. No date: HLD (hyperlipidemia) No date: HTN (hypertension) No date: Hypothyroidism No date: Obesity No date: PAF (paroxysmal atrial fibrillation) ([name])     Comment:  a. on Eliquis; b. CHADS2VASc = 3 (HTN, age x 39, female). 01/24/2015: Paroxysmal atrial fibrillation ([place]) 03/02/2018: Right upper quadrant abdominal tenderness without rebound  tenderness 2008: S/P ascending aortic aneurysm repair   Reproductive/Obstetrics                            Anesthesia Physical Anesthesia Plan  ASA: IV  Anesthesia Plan: General   Post-op Pain Management:    Induction: Intravenous  PONV Risk Score and Plan: 2 and Propofol infusion  Airway Management Planned: Natural Airway  Additional Equipment:   Intra-op Plan:   Post-operative Plan:   Informed Consent: I have reviewed the patients History and Physical, chart, labs and discussed the procedure including the risks, benefits and alternatives for the proposed anesthesia with the patient or authorized representative who has indicated his/her understanding and acceptance.     Dental advisory given  Plan Discussed with: CRNA and Anesthesiologist  Anesthesia Plan Comments:         Anesthesia Quick Evaluation

## 2019-06-05 NOTE — Transfer of Care (Signed)
Immediate Anesthesia Transfer of Care Note  Patient: [name]  Procedure(s) Performed: CARDIOVERSION (N/A )  Patient Location: PACU  Anesthesia Type:General  Level of Consciousness: sedated  Airway & Oxygen Therapy: Patient Spontanous Breathing and Patient connected to nasal cannula oxygen  Post-op Assessment: Report given to RN and Post -op Vital signs reviewed and stable  Post vital signs: Reviewed and stable  Last Vitals:  Vitals Value Taken Time  BP 109/70 06/05/19 0742  Temp    Pulse 84 06/05/19 0744  Resp 21 06/05/19 0744  SpO2 95 % 06/05/19 0744  Vitals shown include unvalidated device data.  Last Pain:  Vitals:   06/05/19 0710  TempSrc: Oral  PainSc: 0-No pain         Complications: No apparent anesthesia complications

## 2019-06-05 NOTE — Telephone Encounter (Signed)
Pt c/o BP issue: STAT if pt c/o blurred vision, one-sided weakness or slurred speech  1. What are your last 5 BP readings? Today 157/93 HR 101  2. Are you having any other symptoms (ex. Dizziness, headache, blurred vision, passed out)? Feels heart thumping in head - No dizziness or headaches   3. What is your BP issue? Patient just came home from hospital but states that right now her BP is too high and pulse is too high.  Please call to discuss

## 2019-06-05 NOTE — Anesthesia Postprocedure Evaluation (Signed)
Anesthesia Post Note  Patient: [name]  Procedure(s) Performed: CARDIOVERSION (N/A )  Patient location during evaluation: PACU Anesthesia Type: General Level of consciousness: awake and alert and oriented Pain management: pain level controlled Vital Signs Assessment: post-procedure vital signs reviewed and stable Respiratory status: spontaneous breathing, nonlabored ventilation and respiratory function stable Cardiovascular status: blood pressure returned to baseline and stable Postop Assessment: no signs of nausea or vomiting Anesthetic complications: no     Last Vitals:  Vitals:   06/05/19 0815 06/05/19 0825  BP: 125/69 127/77  Pulse: 83 86  Resp: 20 (!) 24  Temp:    SpO2: 99% 100%    Last Pain:  Vitals:   06/05/19 0815  TempSrc:   PainSc: 0-No pain                 Shaketta Rill

## 2019-06-06 ENCOUNTER — Encounter: Payer: Self-pay | Admitting: Cardiovascular Disease

## 2019-06-09 ENCOUNTER — Other Ambulatory Visit: Payer: Self-pay

## 2019-06-09 ENCOUNTER — Ambulatory Visit (INDEPENDENT_AMBULATORY_CARE_PROVIDER_SITE_OTHER): Payer: Medicare Other

## 2019-06-09 VITALS — Temp 97.6°F

## 2019-06-09 DIAGNOSIS — Z23 Encounter for immunization: Secondary | ICD-10-CM | POA: Diagnosis not present

## 2019-06-20 ENCOUNTER — Ambulatory Visit: Payer: Medicare Other | Admitting: Nurse Practitioner

## 2019-06-21 ENCOUNTER — Ambulatory Visit (INDEPENDENT_AMBULATORY_CARE_PROVIDER_SITE_OTHER): Payer: Medicare Other | Admitting: Nurse Practitioner

## 2019-06-21 ENCOUNTER — Other Ambulatory Visit: Payer: Self-pay

## 2019-06-21 ENCOUNTER — Encounter: Payer: Self-pay | Admitting: Nurse Practitioner

## 2019-06-21 VITALS — BP 140/70 | HR 69 | Temp 96.4°F | Ht <= 58 in | Wt 186.5 lb

## 2019-06-21 DIAGNOSIS — I10 Essential (primary) hypertension: Secondary | ICD-10-CM

## 2019-06-21 DIAGNOSIS — Z953 Presence of xenogenic heart valve: Secondary | ICD-10-CM | POA: Diagnosis not present

## 2019-06-21 DIAGNOSIS — I4892 Unspecified atrial flutter: Secondary | ICD-10-CM

## 2019-06-21 MED ORDER — HYDRALAZINE HCL 25 MG PO TABS: 25.0000 mg | ORAL_TABLET | Freq: Three times a day (TID) | ORAL | 3 refills | Status: DC

## 2019-06-21 NOTE — Patient Instructions (Addendum)
Medication Instructions:  1- INCREASE Hydralazine Take 1 tablet (25 mg total) by mouth 3 (three) times daily If you need a refill on your cardiac medications before your next appointment, please call your pharmacy.   Lab work: None ordered  If you have labs (blood work) drawn today and your tests are completely normal, you will receive your results only by: [name] MyChart Message (if you have MyChart) OR . A paper copy in the mail If you have any lab test that is abnormal or we need to change your treatment, we will call you to review the results.  Testing/Procedures: None ordered   Follow-Up: At [facility], you and your health needs are our priority.  As part of our continuing mission to provide you with exceptional heart care, we have created designated Provider Care Teams.  These Care Teams include your primary Cardiologist (physician) and Advanced Practice Providers (APPs -  Physician Assistants and Nurse Practitioners) who all work together to provide you with the care you need, when you need it. You will need a follow up appointment in 3 months.  You may see [name], MD or Murray Hodgkins, NP.

## 2019-06-21 NOTE — Progress Notes (Signed)
Office Visit    Patient Name: [name] Date of Encounter: 06/21/2019  Primary Care Provider:  [name], MD Primary Cardiologist:  [name], MD  Chief Complaint    73 year old female with a history of aortic stenosis status post bioprosthetic aortic valve replacement, ascending aortic aneurysm status post repair, paroxysmal atrial fibrillation and flutter on chronic flecainide and Eliquis therapy, COPD on home O2, hypertension, hyperlipidemia, hypothyroidism, and obesity, who presents for follow-up after recent cardioversion..  Past Medical History    Past Medical History:  Diagnosis Date  . 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 s/p bioprosthetic valve replacement 2008 at [facility];  b. 01/2015 Echo: EF 60-65%, no rwma, Gr1 DD, mildly dil LA, nl RV fxn.  . Atrial fibrillation with RVR ([place]) 01/11/2015  . Atrial flutter ([place])    a. 08/2016 s/p DCCV.  Remains on flecainide 50 mg bid.  . Basal skull fracture ([place]) 20 yrs ago  . Chronic respiratory failure ([place])   . COPD (chronic obstructive pulmonary disease) ([place])    a. on home O2 at 2L since 2008  . 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 Essential hypertension 01/24/2015  . History of cardiac cath    a. 2008 prior to Aortic aneurysm repair-->nl cors.  . History of stress test    a. 10/2015 MV: no ischemia/infarct.  [name] HLD (hyperlipidemia)   . HTN (hypertension)   . Hypothyroidism   . Obesity   . PAF (paroxysmal atrial fibrillation) ([name])    a. on Eliquis; b. CHADS2VASc = 3 (HTN, age x 1, female).  . Paroxysmal atrial fibrillation ([place]) 01/24/2015  . Right upper quadrant abdominal tenderness without rebound tenderness 03/02/2018  . S/P ascending aortic aneurysm repair 2008   Past Surgical History:  Procedure Laterality Date  . ABDOMINAL AORTIC ANEURYSM REPAIR  2008  . ABDOMINAL HYSTERECTOMY    . AORTIC VALVE REPLACEMENT  2008  . CARDIAC CATHETERIZATION     [facility]  . CARDIOVERSION  N/A 08/15/2018   Procedure: CARDIOVERSION;  Surgeon: [name], MD;  Location: [facility] ORS;  Service: Cardiovascular;  Laterality: N/A;  . CARDIOVERSION N/A 11/14/2018   Procedure: CARDIOVERSION (CATH LAB);  Surgeon: [name], MD;  Location: [facility] ORS;  Service: Cardiovascular;  Laterality: N/A;  . CARDIOVERSION N/A 06/05/2019   Procedure: CARDIOVERSION;  Surgeon: [name], MD;  Location: [facility] ORS;  Service: Cardiovascular;  Laterality: N/A;  . CARPAL TUNNEL RELEASE    . ELECTROPHYSIOLOGIC STUDY N/A 08/17/2016   Procedure: Cardioversion;  Surgeon: [name], MD;  Location: [facility] ORS;  Service: Cardiovascular;  Laterality: N/A;  . TUMOR EXCISION Left    x3 (arm)    Allergies  Allergies  Allergen Reactions  . Benadryl [Diphenhydramine Hcl (Sleep)] Palpitations  . Cetirizine Palpitations  . Diphenhydramine-Zinc Acetate Palpitations  . Lasix [Furosemide] Rash  . Levaquin [Levofloxacin In D5w] Other (See Comments)    Reaction:  Fatigue and muscle soreness  . Levofloxacin Other (See Comments)    Fatigue, muscle soreness  . Meloxicam Rash  . Soy Allergy Hives and Nausea And Vomiting  . Sulfa Antibiotics Rash    History of Present Illness    73 year old female the above complex past medical history including bicuspid aortic valve with severe aortic stenosis status post bioprosthetic aortic valve replacement and also ascending aortic aneurysm repair in 2008.  Other history includes COPD on home O2, sleep  apnea on CPAP, hypertension, hyperlipidemia, nonobstructive CAD, and paroxysmal atrial fibrillation and flutter status post cardioversion December 2017.  She has been maintained on flecainide and diltiazem therapy while being anticoagulated with Eliquis.  Stress testing in December 2018 was low risk with normal LV function.  Her last echo was performed at [facility] in August 2019 and showed an EF of greater than 50% with mild LVH, bioprosthetic aortic valve with normal  morphology, normal RV function with trivial MR/PR/TR.  In December 2019, she was admitted with rapid atrial flutter that remained recalcitrant despite adjustment in diltiazem dosing.  She subsequently underwent cardioversion.  At some point, flecainide dose was increased to 100 mg twice daily.  She was seen by electrophysiology in April of this year with recommendation for continuation of flecainide and Eliquis as she might not tolerate anesthesia that would come with catheter ablation secondary to COPD.  Flecainide dose was reduced back down to 50 mg twice daily in April secondary to widened QRS on 100 mg twice daily.  She had recurrent atrial flutter in mid September of this year and was referred to A. fib clinic.  She was set up for cardioversion also refer to atrial fibrillation clinic in [place].  On the day of A. fib clinic visit, she felt poorly and remained in atrial flutter.  She was offered ER visit with cardioversion in the ER versus admission the following week for Tikosyn loading.  She preferred to simply follow-up with Dr. Fletcher Anon for cardioversion as scheduled.  This was performed on September 28 and was successful.  It was recommended post cardioversion that she follow-up with electrophysiology for reconsideration of catheter ablation options.  Since her most recent cardioversion, she has done well at home.  She has not had any recurrent palpitations and denies chest pain, PND, orthopnea, dizziness, syncope, edema, or early satiety.  She has some degree of chronic dyspnea on exertion in the setting of COPD on home O2.  She notes today that she has follow-up with [facility] electrophysiology next week to consider other options for management of her atrial fibrillation/flutter.  Home Medications    Prior to Admission medications   Medication Sig Start Date End Date Taking? Authorizing Provider  acetaminophen (TYLENOL) 500 MG tablet Take 500 mg by mouth every 4 (four) hours as needed for moderate  pain or headache.     [provider]  albuterol (PROVENTIL HFA;VENTOLIN HFA) 108 (90 Base) MCG/ACT inhaler Inhale 2 puffs into the lungs every 6 (six) hours as needed for wheezing or shortness of breath. 11/29/18   Ronnell Freshwater, NP  ALPRAZolam (XANAX) 0.25 MG tablet Take 2 tablets (0.5 mg total) by mouth 2 (two) times daily as needed for anxiety. 04/06/19   Ronnell Freshwater, NP  apixaban (ELIQUIS) 5 MG TABS tablet Take 1 tablet (5 mg total) by mouth 2 (two) times daily. 12/06/18   [name], MD  Artificial Tear Solution (GENTEAL TEARS) 0.1-0.2-0.3 % SOLN Place 1 drop into both eyes daily.    [provider]  atorvastatin (LIPITOR) 40 MG tablet 1 tab po qhs Patient taking differently: Take 40 mg by mouth at bedtime. 1 tab po qhs 05/18/19   Boscia, Greer Ee, NP  Coenzyme Q10 (COQ10) 200 MG CAPS Take 200 mg by mouth daily.    [provider]  diclofenac sodium (VOLTAREN) 1 % GEL To the affected joint Patient taking differently: Apply 2-4 g topically 3 (three) times daily as needed (shoulder pain.). To the affected  joint  09/17/17   Ronnell Freshwater, NP  diltiazem (CARDIZEM CD) 120 MG 24 hr capsule Take 1 capsule (120 mg total) by mouth daily. 11/30/18   [name], MD  DULoxetine (CYMBALTA) 60 MG capsule Take 1 capsule (60 mg total) by mouth daily. 03/30/19   Ronnell Freshwater, NP  ferrous sulfate (FERROUSUL) 325 (65 FE) MG tablet Take 650 mg by mouth daily with breakfast.     [provider]  flecainide (TAMBOCOR) 50 MG tablet Take 1 tablet (50 mg total) by mouth 2 (two) times daily. 05/04/19   Deboraha Sprang, MD  hydrALAZINE (APRESOLINE) 25 MG tablet Take 1 tablet (25 mg total) by mouth 2 (two) times daily. 12/06/18 06/05/19  [name], MD  HYDROcodone-acetaminophen (NORCO/VICODIN) 5-325 MG tablet Take 1 tablet by mouth every 6 (six) hours as needed for moderate pain. 02/25/19   Delman Kitten, MD  levothyroxine (SYNTHROID) 25 MCG tablet Take 1  tablet (25 mcg total) by mouth daily before breakfast. 01/04/19   Ronnell Freshwater, NP  lisinopril-hydrochlorothiazide (ZESTORETIC) 20-25 MG tablet Take 1 tablet by mouth daily. 03/30/19   [name], MD  loratadine (CLARITIN) 10 MG tablet Take 10 mg by mouth daily as needed for allergies.     [provider]  OXYGEN Inhale into the lungs.    [provider]  pantoprazole (PROTONIX) 40 MG tablet Take 1 tablet (40 mg total) by mouth daily. 01/04/19   Ronnell Freshwater, NP  potassium chloride SA (K-DUR,KLOR-CON) 20 MEQ tablet Take 1 tablet (20 mEq total) by mouth 2 (two) times daily. Patient taking differently: Take 40 mEq by mouth 2 (two) times daily.  12/09/18   [name], MD  sodium chloride (OCEAN) 0.65 % SOLN nasal spray Place 1 spray into both nostrils daily as needed for congestion.    [provider]  Tiotropium Bromide-Olodaterol (STIOLTO RESPIMAT) 2.5-2.5 MCG/ACT AERS Inhale 1 puff into the lungs daily.    [provider]  traZODone (DESYREL) 50 MG tablet Take 1 tablet (50 mg total) by mouth at bedtime. 03/30/19   Ronnell Freshwater, NP  triamcinolone (KENALOG) 0.025 % cream Apply 1 application topically 2 (two) times daily. 05/02/19   Ronnell Freshwater, NP    Review of Systems    Stable since cardioversion.  Chronic dyspnea on exertion which is unchanged.  She denies chest pain, palpitations, PND, orthopnea, dizziness, syncope, edema, or early satiety.  All other systems reviewed and are otherwise negative except as noted above.  Physical Exam    VS:  BP 140/70 (BP Location: Right Arm, Patient Position: Sitting, Cuff Size: Normal)   Pulse 69   Temp (!) 96.4 F (35.8 C)   Ht 4\' 10"  (1.473 m)   Wt 186 lb 8 oz (84.6 kg)   SpO2 94% Comment: on 2L O2  BMI 38.98 kg/m  , BMI Body mass index is 38.98 kg/m. GEN: Well nourished, well developed, in no acute distress. HEENT: She is hard of hearing. Neck: Supple, no JVD, carotid bruits, or masses.  Cardiac: RRR, 2/6 systolic ejection murmur loudest at the upper sternal borders but heard throughout.  No rubs, or gallops. No clubbing, cyanosis, edema.  Radials/PT 2+ and equal bilaterally.  Respiratory:  Respirations regular and unlabored, markedly diminished breath sounds bilaterally. GI: Soft, nontender, nondistended, BS + x 4. MS: no deformity or atrophy. Skin: warm and dry, no rash. Neuro:  Strength and sensation are intact. Psych: Normal affect.  Accessory Clinical Findings    ECG personally reviewed by me today -regular sinus rhythm, 69, left axis.  Question anterior infarct. - no acute changes.  Lab Results  Component Value Date   WBC 6.6 06/01/2019   HGB 10.9 (L) 06/01/2019   HCT 34.6 (L) 06/01/2019   MCV 83.2 06/01/2019   PLT 307 06/01/2019   Lab Results  Component Value Date   CREATININE 0.67 06/01/2019   BUN 14 06/01/2019   NA 134 (L) 06/01/2019   K 3.6 06/01/2019   CL 99 06/01/2019   CO2 25 06/01/2019   Lab Results  Component Value Date   ALT 19 10/26/2015   AST 23 10/26/2015   ALKPHOS 70 10/26/2015   BILITOT 0.4 10/26/2015    Assessment & Plan    1.  Paroxysmal atrial flutter and fibrillation: Status post most recent cardioversion in late September.  Maintaining sinus rhythm on diltiazem and flecainide.  She is chronically anticoagulated on Eliquis.  Of note, she previous had widening of QRS on 100 mg twice daily of flecainide and is currently on 50 mg twice daily.  She has set up an appointment with [place] [facility] electrophysiology for next week to consider alternative options for management.  2.  Essential hypertension: Blood pressure is mildly elevated today.  She says that this is pretty typical for her.  I see she is only taking hydralazine 25 mg twice daily and I have asked her to increase this to 3 times daily.  3.  History of aortic valve replacement with bioprosthetic valve.  Normal functioning valve by echo at [facility] in 2019.  4.  COPD: No active wheezing  and remains on home O2.  Very diminished breath sounds.  Inhaler therapy per pulmonology.  5.  History of chest pain: Prior normal stress test in 2018.  6.  Disposition: Follow-up with Dr. Fletcher Anon in 3 months or sooner if necessary.  She will be seen by EP at [facility] next week.   Murray Hodgkins, NP 06/21/2019, 4:43 PM

## 2019-06-22 DIAGNOSIS — G4733 Obstructive sleep apnea (adult) (pediatric): Secondary | ICD-10-CM | POA: Diagnosis not present

## 2019-06-23 ENCOUNTER — Encounter

## 2019-06-23 ENCOUNTER — Ambulatory Visit: Payer: Medicare Other | Admitting: Cardiovascular Disease

## 2019-06-26 ENCOUNTER — Other Ambulatory Visit: Payer: Self-pay

## 2019-06-26 ENCOUNTER — Telehealth: Payer: Self-pay | Admitting: Cardiovascular Disease

## 2019-06-26 MED ORDER — HYDRALAZINE HCL 25 MG PO TABS: 25.0000 mg | ORAL_TABLET | Freq: Three times a day (TID) | ORAL | 3 refills | Status: DC

## 2019-06-26 NOTE — Telephone Encounter (Signed)
hydrALAZINE (APRESOLINE) 25 MG tablet 90 tablet 3 06/26/2019 09/24/2019   Sig - Route: Take 1 tablet (25 mg total) by mouth 3 (three) times daily. - Oral   Sent to pharmacy as: hydrALAZINE (APRESOLINE) 25 MG tablet   E-Prescribing Status: Transmission to pharmacy in progress (06/26/2019 10:05 AM EDT)   Pharmacy  [id number] [address], [place]

## 2019-06-26 NOTE — Telephone Encounter (Signed)
°*  STAT* If patient is at the pharmacy, call can be transferred to refill team.   1. Which medications need to be refilled? (please list name of each medication and dose if known)    Hydralazine 25 mg po TID  2. Which pharmacy/location (including street and city if local pharmacy) is medication to be sent to?  [address]   3. Do they need a 30 day or 90 day supply? 78  Sent to wrong pharmacy at [facility]

## 2019-06-27 DIAGNOSIS — I480 Paroxysmal atrial fibrillation: Secondary | ICD-10-CM | POA: Diagnosis not present

## 2019-06-28 ENCOUNTER — Other Ambulatory Visit: Payer: Self-pay | Admitting: Cardiovascular Disease

## 2019-06-28 ENCOUNTER — Other Ambulatory Visit: Payer: Self-pay | Admitting: Nurse Practitioner

## 2019-06-28 MED ORDER — PANTOPRAZOLE SODIUM 40 MG PO TBEC: 40.0000 mg | DELAYED_RELEASE_TABLET | Freq: Every day | ORAL | 1 refills | Status: DC

## 2019-06-28 MED ORDER — LEVOTHYROXINE SODIUM 25 MCG PO TABS: 25.0000 ug | ORAL_TABLET | Freq: Every day | ORAL | 1 refills | Status: DC

## 2019-07-03 DIAGNOSIS — J449 Chronic obstructive pulmonary disease, unspecified: Secondary | ICD-10-CM | POA: Diagnosis not present

## 2019-07-05 ENCOUNTER — Telehealth: Payer: Self-pay

## 2019-07-05 DIAGNOSIS — M19012 Primary osteoarthritis, left shoulder: Secondary | ICD-10-CM | POA: Diagnosis not present

## 2019-07-05 NOTE — Telephone Encounter (Signed)
Denied pt xanax refill because pt has an appt 07/09/19.

## 2019-07-10 ENCOUNTER — Encounter: Payer: Self-pay | Admitting: Nurse Practitioner

## 2019-07-10 ENCOUNTER — Ambulatory Visit (INDEPENDENT_AMBULATORY_CARE_PROVIDER_SITE_OTHER): Payer: Medicare Other | Admitting: Nurse Practitioner

## 2019-07-10 ENCOUNTER — Other Ambulatory Visit: Payer: Self-pay

## 2019-07-10 VITALS — BP 167/82 | HR 72 | Temp 98.2°F | Resp 16 | Ht <= 58 in | Wt 182.0 lb

## 2019-07-10 DIAGNOSIS — Z1231 Encounter for screening mammogram for malignant neoplasm of breast: Secondary | ICD-10-CM | POA: Insufficient documentation

## 2019-07-10 DIAGNOSIS — Z0001 Encounter for general adult medical examination with abnormal findings: Secondary | ICD-10-CM | POA: Diagnosis not present

## 2019-07-10 DIAGNOSIS — R300 Dysuria: Secondary | ICD-10-CM

## 2019-07-10 DIAGNOSIS — Z9981 Dependence on supplemental oxygen: Secondary | ICD-10-CM | POA: Diagnosis not present

## 2019-07-10 DIAGNOSIS — I4892 Unspecified atrial flutter: Secondary | ICD-10-CM

## 2019-07-10 DIAGNOSIS — I10 Essential (primary) hypertension: Secondary | ICD-10-CM | POA: Diagnosis not present

## 2019-07-10 DIAGNOSIS — J449 Chronic obstructive pulmonary disease, unspecified: Secondary | ICD-10-CM

## 2019-07-10 NOTE — Progress Notes (Signed)
[facility] [place], [place] [address]  Internal MEDICINE  Office Visit Note  Patient Name: [name]  [id number]  [id number]  Date of Service: 07/10/2019   Pt is here for routine health maintenance examination  Chief Complaint  Patient presents with  . Annual Exam  . Hypertension  . Hyperlipidemia  . Quality Metric Gaps    mammogram     The patient is here for health maintenance exam. She states she recently had to have cardioversion done due to chronic a-fib. She had labs done. She was mildly anemic. She has since, doubled her iron pills. She also had elevated blood sugar at 119. She has been doing well since then. States that Dr. Fletcher Anon has increased her hydralazine at her most recent visit last week. She has not started the increased dose yet as they have note arrived via pill pack. She is having shortness of breath. This is chronic condition. Worse with exertion. She is on oxygen at all times. She is due to have routine, fasting labs. She is also due to have screening mammogram.     Current Medication: Outpatient Encounter Medications as of 07/10/2019  Medication Sig  . acetaminophen (TYLENOL) 500 MG tablet Take 500 mg by mouth every 4 (four) hours as needed for moderate pain or headache.   . albuterol (PROVENTIL HFA;VENTOLIN HFA) 108 (90 Base) MCG/ACT inhaler Inhale 2 puffs into the lungs every 6 (six) hours as needed for wheezing or shortness of breath.  . ALPRAZolam (XANAX) 0.25 MG tablet Take 2 tablets (0.5 mg total) by mouth 2 (two) times daily as needed for anxiety.  [name] apixaban (ELIQUIS) 5 MG TABS tablet Take 1 tablet (5 mg total) by mouth 2 (two) times daily.  . Artificial Tear Solution (GENTEAL TEARS) 0.1-0.2-0.3 % SOLN Place 1 drop into both eyes daily.  [name] atorvastatin (LIPITOR) 40 MG tablet 1 tab po qhs (Patient taking differently: Take 40 mg by mouth at bedtime. 1 tab po qhs)  . Coenzyme Q10 (COQ10) 200 MG CAPS Take 200 mg by mouth  daily.  . diclofenac sodium (VOLTAREN) 1 % GEL To the affected joint (Patient taking differently: Apply 2-4 g topically 3 (three) times daily as needed (shoulder pain.). To the affected joint )  . diltiazem (CARDIZEM CD) 120 MG 24 hr capsule Take 1 capsule (120 mg total) by mouth daily.  . DULoxetine (CYMBALTA) 60 MG capsule Take 1 capsule (60 mg total) by mouth daily.  . ferrous sulfate (FERROUSUL) 325 (65 FE) MG tablet Take 650 mg by mouth daily with breakfast.   . flecainide (TAMBOCOR) 50 MG tablet Take 1 tablet (50 mg total) by mouth 2 (two) times daily.  . hydrALAZINE (APRESOLINE) 25 MG tablet Take 1 tablet (25 mg total) by mouth 3 (three) times daily.  [name] HYDROcodone-acetaminophen (NORCO/VICODIN) 5-325 MG tablet Take 1 tablet by mouth every 6 (six) hours as needed for moderate pain.  . Ipratropium-Albuterol (COMBIVENT) 20-100 MCG/ACT AERS respimat Inhale 2 puffs into the lungs 4 (four) times daily.  [name] levothyroxine (SYNTHROID) 25 MCG tablet Take 1 tablet (25 mcg total) by mouth daily before breakfast.  . lisinopril-hydrochlorothiazide (ZESTORETIC) 20-25 MG tablet Take 1 tablet by mouth daily.  [name] loratadine (CLARITIN) 10 MG tablet Take 10 mg by mouth daily as needed for allergies.   . OXYGEN Inhale into the lungs.  . pantoprazole (PROTONIX) 40 MG tablet Take 1 tablet (40 mg total) by mouth daily.  . potassium chloride (KLOR-CON) 10 MEQ  tablet Take 2 tablets by mouth 2 (two) times daily.  . sodium chloride (OCEAN) 0.65 % SOLN nasal spray Place 1 spray into both nostrils daily as needed for congestion.  . Tiotropium Bromide-Olodaterol (STIOLTO RESPIMAT) 2.5-2.5 MCG/ACT AERS Inhale 1 puff into the lungs daily.  . traZODone (DESYREL) 50 MG tablet Take 1 tablet (50 mg total) by mouth at bedtime.  . triamcinolone (KENALOG) 0.025 % cream Apply 1 application topically 2 (two) times daily.   No facility-administered encounter medications on file as of 07/10/2019.     Surgical History: Past Surgical  History:  Procedure Laterality Date  . ABDOMINAL AORTIC ANEURYSM REPAIR  2008  . ABDOMINAL HYSTERECTOMY    . AORTIC VALVE REPLACEMENT  2008  . CARDIAC CATHETERIZATION     [facility]  . CARDIOVERSION N/A 08/15/2018   Procedure: CARDIOVERSION;  Surgeon: [name], MD;  Location: [facility] ORS;  Service: Cardiovascular;  Laterality: N/A;  . CARDIOVERSION N/A 11/14/2018   Procedure: CARDIOVERSION (CATH LAB);  Surgeon: [name], MD;  Location: [facility] ORS;  Service: Cardiovascular;  Laterality: N/A;  . CARDIOVERSION N/A 06/05/2019   Procedure: CARDIOVERSION;  Surgeon: [name], MD;  Location: [facility] ORS;  Service: Cardiovascular;  Laterality: N/A;  . CARPAL TUNNEL RELEASE    . ELECTROPHYSIOLOGIC STUDY N/A 08/17/2016   Procedure: Cardioversion;  Surgeon: [name], MD;  Location: [facility] ORS;  Service: Cardiovascular;  Laterality: N/A;  . TUMOR EXCISION Left    x3 (arm)    Medical History: Past Medical History:  Diagnosis Date  . 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 s/p bioprosthetic valve replacement 2008 at [facility];  b. 01/2015 Echo: EF 60-65%, no rwma, Gr1 DD, mildly dil LA, nl RV fxn.  . Atrial fibrillation with RVR ([place]) 01/11/2015  . Atrial flutter ([place])    a. 08/2016 s/p DCCV.  Remains on flecainide 50 mg bid.  . Basal skull fracture ([place]) 20 yrs ago  . Chronic respiratory failure ([place])   . COPD (chronic obstructive pulmonary disease) ([place])    a. on home O2 at 2L since 2008  . 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 Essential hypertension 01/24/2015  . History of cardiac cath    a. 2008 prior to Aortic aneurysm repair-->nl cors.  . History of stress test    a. 10/2015 MV: no ischemia/infarct.  [name] HLD (hyperlipidemia)   . HTN (hypertension)   . Hypothyroidism   . Obesity   . PAF (paroxysmal atrial fibrillation) ([name])    a. on Eliquis; b. CHADS2VASc = 3 (HTN, age x 1, female).  . Paroxysmal atrial fibrillation ([place]) 01/24/2015  . Right  upper quadrant abdominal tenderness without rebound tenderness 03/02/2018  . S/P ascending aortic aneurysm repair 2008    Family History: Family History  Problem Relation Age of Onset  . Stroke Father   . Stroke Paternal Grandmother       Review of Systems  Constitutional: Positive for fatigue. Negative for chills and unexpected weight change.  HENT: Negative for congestion, postnasal drip, rhinorrhea, sneezing and sore throat.   Eyes: Negative for redness.  Respiratory: Positive for shortness of breath and wheezing. Negative for cough and chest tightness.        Continues to use oxygen at all times.   Cardiovascular: Positive for palpitations. Negative for chest pain.  Gastrointestinal: Negative for abdominal pain, constipation, diarrhea, nausea and vomiting.  Endocrine: Negative for cold intolerance,  heat intolerance, polydipsia and polyuria.       Well controlled thyroid disease. Most recent blood sugar was 119. Did not have HgbA1c done at the hospital.   Musculoskeletal: Negative for arthralgias, back pain, joint swelling and neck pain.  Skin: Negative for rash.  Allergic/Immunologic: Positive for environmental allergies.  Neurological: Negative for dizziness, tremors, numbness and headaches.  Hematological: Negative for adenopathy. Does not bruise/bleed easily.  Psychiatric/Behavioral: Positive for dysphoric mood. Negative for behavioral problems (Depression), sleep disturbance and suicidal ideas. The patient is nervous/anxious.      Today's Vitals   07/10/19 1441  BP: (!) 167/82  Pulse: 72  Resp: 16  Temp: 98.2 F (36.8 C)  SpO2: 96%  Weight: 182 lb (82.6 kg)  Height: 4\' 10"  (1.473 m)   Body mass index is 38.04 kg/m.  Physical Exam Vitals signs and nursing note reviewed.  Constitutional:      Appearance: Normal appearance. She is well-developed.  HENT:     Head: Normocephalic and atraumatic.     Nose: Nose normal.     Mouth/Throat:     Pharynx: No  oropharyngeal exudate.  Eyes:     Extraocular Movements: Extraocular movements intact.     Conjunctiva/sclera: Conjunctivae normal.     Pupils: Pupils are equal, round, and reactive to light.  Neck:     Musculoskeletal: Normal range of motion and neck supple.     Thyroid: No thyromegaly.     Vascular: No carotid bruit or JVD.     Trachea: No tracheal deviation.  Cardiovascular:     Rate and Rhythm: Normal rate. Rhythm irregular.     Heart sounds: Murmur present.  Pulmonary:     Effort: Pulmonary effort is normal.     Breath sounds: Normal breath sounds. No rhonchi or rales.  Abdominal:     General: Bowel sounds are normal.     Palpations: Abdomen is soft.     Tenderness: There is no abdominal tenderness.  Musculoskeletal: Normal range of motion.  Lymphadenopathy:     Cervical: No cervical adenopathy.  Skin:    General: Skin is warm and dry.  Neurological:     Mental Status: She is alert and oriented to person, place, and time.  Psychiatric:        Mood and Affect: Mood is anxious and depressed.        Speech: Speech normal.        Behavior: Behavior normal.        Thought Content: Thought content normal.        Judgment: Judgment normal.    Depression screen [facility] 2/9 07/10/2019 04/06/2019 01/02/2019 10/04/2018 07/07/2018  Decreased Interest 3 1 0 0 0  Down, Depressed, Hopeless 3 1 0 0 0  PHQ - 2 Score 6 2 0 0 0  Altered sleeping 1 0 - - -  Tired, decreased energy 3 3 - - -  Change in appetite 3 1 - - -  Feeling bad or failure about yourself  1 1 - - -  Trouble concentrating 0 1 - - -  Moving slowly or fidgety/restless 0 1 - - -  Suicidal thoughts 0 0 - - -  PHQ-9 Score 14 9 - - -  Difficult doing work/chores Not difficult at all Somewhat difficult - - -    Functional Status Survey: Is the patient deaf or have difficulty hearing?: Yes(wears hearing aid in right ear, left ear complete hearing loss) Does the patient have difficulty seeing, even when  wearing  glasses/contacts?: Yes(sometimes) Does the patient have difficulty concentrating, remembering, or making decisions?: Yes(sometimes) Does the patient have difficulty walking or climbing stairs?: Yes(loss of balance, cant climb stairs due to breathing) Does the patient have difficulty dressing or bathing?: Yes(arthritis in shoulders) Does the patient have difficulty doing errands alone such as visiting a doctor's office or shopping?: No  MMSE - Mini Mental State Exam 07/10/2019 07/07/2018  Orientation to time 5 5  Orientation to Place 5 5  Registration 3 3  Attention/ Calculation 5 5  Recall 3 3  Language- name 2 objects 2 2  Language- repeat 1 1  Language- follow 3 step command 3 3  Language- read & follow direction 1 1  Write a sentence 1 1  Copy design 1 1  Total score 30 30    Fall Risk  07/10/2019 04/06/2019 01/02/2019 10/04/2018 07/07/2018  Falls in the past year? 0 1 0 0 Yes  Number falls in past yr: - 0 - 0 1  Injury with Fall? - 0 - 0 No      LABS: Recent Results (from the past 2160 hour(s))  CBC with Differential/Platelet     Status: Abnormal   Collection Time: 06/01/19 11:46 AM  Result Value Ref Range   WBC 6.6 4.0 - 10.5 K/uL   RBC 4.16 3.87 - 5.11 MIL/uL   Hemoglobin 10.9 (L) 12.0 - 15.0 g/dL   HCT 34.6 (L) 36.0 - 46.0 %   MCV 83.2 80.0 - 100.0 fL   MCH 26.2 26.0 - 34.0 pg   MCHC 31.5 30.0 - 36.0 g/dL   RDW 17.2 (H) 11.5 - 15.5 %   Platelets 307 150 - 400 K/uL   nRBC 0.0 0.0 - 0.2 %   Neutrophils Relative % 67 %   Neutro Abs 4.5 1.7 - 7.7 K/uL   Lymphocytes Relative 17 %   Lymphs Abs 1.1 0.7 - 4.0 K/uL   Monocytes Relative 13 %   Monocytes Absolute 0.8 0.1 - 1.0 K/uL   Eosinophils Relative 1 %   Eosinophils Absolute 0.1 0.0 - 0.5 K/uL   Basophils Relative 1 %   Basophils Absolute 0.0 0.0 - 0.1 K/uL   Immature Granulocytes 1 %   Abs Immature Granulocytes 0.03 0.00 - 0.07 K/uL    Comment: Performed at [facility], [address]  [place], [place] [address]  Basic metabolic panel     Status: Abnormal   Collection Time: 06/01/19 11:46 AM  Result Value Ref Range   Sodium 134 (L) 135 - 145 mmol/L   Potassium 3.6 3.5 - 5.1 mmol/L   Chloride 99 98 - 111 mmol/L   CO2 25 22 - 32 mmol/L   Glucose, Bld 119 (H) 70 - 99 mg/dL   BUN 14 8 - 23 mg/dL   Creatinine, Ser 0.67 0.44 - 1.00 mg/dL   Calcium 9.2 8.9 - 10.3 mg/dL   GFR calc non Af Amer >60 >60 mL/min   GFR calc Af Amer >60 >60 mL/min   Anion gap 10 5 - 15    Comment: Performed at [facility], [address] [place], [place] [address]  SARS CORONAVIRUS 2 (TAT 6-24 HRS) Nasopharyngeal Nasopharyngeal Swab     Status: None   Collection Time: 06/01/19  1:38 PM   Specimen: Nasopharyngeal Swab  Result Value Ref Range   SARS Coronavirus 2 NEGATIVE NEGATIVE    Comment: (NOTE) SARS-CoV-2 target nucleic acids are NOT DETECTED. The SARS-CoV-2 RNA is generally detectable  in upper and lower respiratory specimens during the acute phase of infection. Negative results do not preclude SARS-CoV-2 infection, do not rule out co-infections with other pathogens, and should not be used as the sole basis for treatment or other patient management decisions. Negative results must be combined with clinical observations, patient history, and epidemiological information. The expected result is Negative. Fact Sheet for Patients: SugarRoll.be Fact Sheet for Healthcare Providers: https://www.woods-mathews.com/ This test is not yet approved or cleared by the [place] FDA and  has been authorized for detection and/or diagnosis of SARS-CoV-2 by FDA under an Emergency Use Authorization (EUA). This EUA will remain  in effect (meaning this test can be used) for the duration of the COVID-19 declaration under Section 56 4(b)(1) of the Act, 21 U.S.C. section 360bbb-3(b)(1), unless the authorization is terminated or revoked sooner. Performed at  [facility], [place] [address], [place], [place] [address]    Assessment/Plan: 1. Encounter for general adult medical examination with abnormal findings Annual health maintenance exam today. Lab slip given to have routine, fasting labs drawn. Include anemia panel and HgbA1c   2. Essential hypertension Followed per cardiology. Has to increase hydralazine dosing. New prescription arrived at her home today.   3. Paroxysmal atrial flutter ([name]) Followed per Dr. Fletcher Anon   4. Chronic obstructive pulmonary disease, unspecified COPD type ([place]) Stable. Continue inhalers and respiratory medication as prescribed   5. Oxygen dependent Continue nasal cannula oxygen at all times.   6. Encounter for screening mammogram for malignant neoplasm of breast - MM DIGITAL SCREENING BILATERAL; Future  7. Dysuria - UA/M w/rflx Culture, Routine  General Counseling: Yazlin verbalizes understanding of the findings of todays visit and agrees with plan of treatment. I have discussed any further diagnostic evaluation that may be needed or ordered today. We also reviewed her medications today. she has been encouraged to call the office with any questions or concerns that should arise related to todays visit.    Counseling:  Hypertension Counseling:   The following hypertensive lifestyle modification were recommended and discussed:  1. Limiting alcohol intake to less than 1 oz/day of ethanol:(24 oz of beer or 8 oz of wine or 2 oz of 100-proof whiskey). 2. Take baby ASA 81 mg daily. 3. Importance of regular aerobic exercise and losing weight. 4. Reduce dietary saturated fat and cholesterol intake for overall cardiovascular health. 5. Maintaining adequate dietary potassium, calcium, and magnesium intake. 6. Regular monitoring of the blood pressure. 7. Reduce sodium intake to less than 100 mmol/day (less than 2.3 gm of sodium or less than 6 gm of sodium choride)   This patient was seen by [place] with Dr [name] as a part of collaborative care agreement  Orders Placed This Encounter  Procedures  . MM DIGITAL SCREENING BILATERAL  . UA/M w/rflx Culture, Routine    Time spent: [place], MD  Internal Medicine

## 2019-07-11 LAB — UA/M W/RFLX CULTURE, ROUTINE
Bilirubin, UA: NEGATIVE
Glucose, UA: NEGATIVE
Ketones, UA: NEGATIVE
Leukocytes,UA: NEGATIVE
Nitrite, UA: NEGATIVE
Protein,UA: NEGATIVE
RBC, UA: NEGATIVE
Specific Gravity, UA: 1.014 (ref 1.005–1.030)
Urobilinogen, Ur: 0.2 mg/dL (ref 0.2–1.0)
pH, UA: 7.5 (ref 5.0–7.5)

## 2019-07-11 LAB — MICROSCOPIC EXAMINATION: Casts: NONE SEEN /lpf

## 2019-07-17 ENCOUNTER — Other Ambulatory Visit: Payer: Self-pay | Admitting: Nurse Practitioner

## 2019-07-17 DIAGNOSIS — R7301 Impaired fasting glucose: Secondary | ICD-10-CM | POA: Diagnosis not present

## 2019-07-17 DIAGNOSIS — I10 Essential (primary) hypertension: Secondary | ICD-10-CM | POA: Diagnosis not present

## 2019-07-17 DIAGNOSIS — Z0001 Encounter for general adult medical examination with abnormal findings: Secondary | ICD-10-CM | POA: Diagnosis not present

## 2019-07-17 DIAGNOSIS — E559 Vitamin D deficiency, unspecified: Secondary | ICD-10-CM | POA: Diagnosis not present

## 2019-07-18 LAB — CBC
Hematocrit: 36.6 % (ref 34.0–46.6)
Hemoglobin: 12.1 g/dL (ref 11.1–15.9)
MCH: 26.8 pg (ref 26.6–33.0)
MCHC: 33.1 g/dL (ref 31.5–35.7)
MCV: 81 fL (ref 79–97)
Platelets: 305 10*3/uL (ref 150–450)
RBC: 4.52 x10E6/uL (ref 3.77–5.28)
RDW: 16.2 % — ABNORMAL HIGH (ref 11.7–15.4)
WBC: 5.5 10*3/uL (ref 3.4–10.8)

## 2019-07-18 LAB — COMPREHENSIVE METABOLIC PANEL
ALT: 13 IU/L (ref 0–32)
AST: 12 IU/L (ref 0–40)
Albumin/Globulin Ratio: 1.8 (ref 1.2–2.2)
Albumin: 4.1 g/dL (ref 3.7–4.7)
Alkaline Phosphatase: 75 IU/L (ref 39–117)
BUN/Creatinine Ratio: 18 (ref 12–28)
BUN: 14 mg/dL (ref 8–27)
Bilirubin Total: 0.2 mg/dL (ref 0.0–1.2)
CO2: 25 mmol/L (ref 20–29)
Calcium: 9.3 mg/dL (ref 8.7–10.3)
Chloride: 98 mmol/L (ref 96–106)
Creatinine, Ser: 0.79 mg/dL (ref 0.57–1.00)
GFR calc Af Amer: 87 mL/min/{1.73_m2} (ref >59)
GFR calc non Af Amer: 76 mL/min/{1.73_m2} (ref >59)
Globulin, Total: 2.3 g/dL (ref 1.5–4.5)
Glucose: 99 mg/dL (ref 65–99)
Potassium: 4.3 mmol/L (ref 3.5–5.2)
Sodium: 139 mmol/L (ref 134–144)
Total Protein: 6.4 g/dL (ref 6.0–8.5)

## 2019-07-18 LAB — LIPID PANEL W/O CHOL/HDL RATIO
Cholesterol, Total: 127 mg/dL (ref 100–199)
HDL: 53 mg/dL (ref >39)
LDL Chol Calc (NIH): 56 mg/dL (ref 0–99)
Triglycerides: 94 mg/dL (ref 0–149)
VLDL Cholesterol Cal: 18 mg/dL (ref 5–40)

## 2019-07-18 LAB — B12 AND FOLATE PANEL
Folate: 17.4 ng/mL (ref >3.0)
Vitamin B-12: 556 pg/mL (ref 232–1245)

## 2019-07-18 LAB — IRON AND TIBC
Iron Saturation: 13 % — ABNORMAL LOW (ref 15–55)
Iron: 45 ug/dL (ref 27–139)
Total Iron Binding Capacity: 357 ug/dL (ref 250–450)
UIBC: 312 ug/dL (ref 118–369)

## 2019-07-18 LAB — VITAMIN D 25 HYDROXY (VIT D DEFICIENCY, FRACTURES): Vit D, 25-Hydroxy: 32.8 ng/mL (ref 30.0–100.0)

## 2019-07-18 LAB — HGB A1C W/O EAG: Hgb A1c MFr Bld: 5.7 % — ABNORMAL HIGH (ref 4.8–5.6)

## 2019-07-18 LAB — T4, FREE: Free T4: 1.49 ng/dL (ref 0.82–1.77)

## 2019-07-18 LAB — FERRITIN: Ferritin: 73 ng/mL (ref 15–150)

## 2019-07-18 LAB — TSH: TSH: 3.22 u[IU]/mL (ref 0.450–4.500)

## 2019-07-21 ENCOUNTER — Telehealth: Payer: Self-pay | Admitting: Internal Medicine

## 2019-07-21 NOTE — Telephone Encounter (Signed)
lmom to give [place] a call back

## 2019-07-24 ENCOUNTER — Telehealth: Payer: Self-pay

## 2019-07-24 NOTE — Telephone Encounter (Signed)
Patient advised that dfk has reviewed all labs, within normal limits, will be discussed at next follow up appt. tat

## 2019-07-24 NOTE — Telephone Encounter (Signed)
Pt was notified.  

## 2019-07-26 DIAGNOSIS — Z1231 Encounter for screening mammogram for malignant neoplasm of breast: Secondary | ICD-10-CM | POA: Diagnosis not present

## 2019-07-26 DIAGNOSIS — Z1239 Encounter for other screening for malignant neoplasm of breast: Secondary | ICD-10-CM | POA: Diagnosis not present

## 2019-08-03 DIAGNOSIS — J449 Chronic obstructive pulmonary disease, unspecified: Secondary | ICD-10-CM | POA: Diagnosis not present

## 2019-08-07 ENCOUNTER — Telehealth: Payer: Self-pay | Admitting: Cardiovascular Disease

## 2019-08-07 NOTE — Telephone Encounter (Signed)
Called patient and scheduled her to see Dr Fletcher Anon tomorrow afternoon.

## 2019-08-07 NOTE — Telephone Encounter (Signed)
Patient c/o Palpitations:  High priority if patient c/o lightheadedness, shortness of breath, or chest pain  1) How long have you had palpitations/irregular HR/ Afib? Are you having the symptoms now?  Yes   2) Are you currently experiencing lightheadedness, SOB or CP?  Sob weakness dizzy pre syncope   3) Do you have a history of afib (atrial fibrillation) or irregular heart rhythm?  Yes   4) Have you checked your BP or HR? (document readings if available):    BP : fine   HR 120's   5) Are you experiencing any other symptoms?  See above patient requesting to be readmitted for cardioversion

## 2019-08-07 NOTE — Telephone Encounter (Signed)
Patient calling to say she is in A fib again. States she needs to be "shocked" back into rhythm. Reports symptoms of increased HR, weakness and dizziness. Feels like it has before when she's been in aFIb. Offered her to come in tomorrow to see Thurmond Butts to discuss options. Patient refused. Says she just needs to be shocked again.  On 06/27/19, she saw Dr [name] at [facility] for potential device. States she has to be in the right rhythm before placement. States she was seen in the aFib clinic and refuses to do Tikosyn. Would like to know what Dr Fletcher Anon wants to do next.  Routing to [place] for advise.

## 2019-08-07 NOTE — Telephone Encounter (Signed)
She should be seen in the office this week by me or APP with an EKG to confirm her rhythm and schedule cardioversion if needed.

## 2019-08-08 ENCOUNTER — Ambulatory Visit (INDEPENDENT_AMBULATORY_CARE_PROVIDER_SITE_OTHER): Payer: Medicare Other | Admitting: Cardiovascular Disease

## 2019-08-08 ENCOUNTER — Encounter: Payer: Self-pay | Admitting: Cardiovascular Disease

## 2019-08-08 ENCOUNTER — Other Ambulatory Visit: Payer: Self-pay

## 2019-08-08 VITALS — BP 120/80 | HR 112 | Ht <= 58 in | Wt 184.2 lb

## 2019-08-08 DIAGNOSIS — I359 Nonrheumatic aortic valve disorder, unspecified: Secondary | ICD-10-CM | POA: Diagnosis not present

## 2019-08-08 DIAGNOSIS — I4891 Unspecified atrial fibrillation: Secondary | ICD-10-CM

## 2019-08-08 DIAGNOSIS — I10 Essential (primary) hypertension: Secondary | ICD-10-CM | POA: Diagnosis not present

## 2019-08-08 NOTE — Patient Instructions (Addendum)
Medication Instructions:  Your physician recommends that you continue on your current medications as directed. Please refer to the Current Medication list given to you today.  *If you need a refill on your cardiac medications before your next appointment, please call your pharmacy*  Lab Work: Bmet and Cbc today. Please have your labs drawn at the [facility] will need a COVID test on 08/10/19 between 12:30-2:30pm Please report to the medical arts drive up test site.  If you have labs (blood work) drawn today and your tests are completely normal, you will receive your results only by: [name] MyChart Message (if you have MyChart) OR . A paper copy in the mail If you have any lab test that is abnormal or we need to change your treatment, we will call you to review the results.  Testing/Procedures: Your physician has recommended that you have a Cardioversion (DCCV). Electrical Cardioversion uses a jolt of electricity to your heart either through paddles or wired patches attached to your chest. This is a controlled, usually prescheduled, procedure. Defibrillation is done under light anesthesia in the hospital, and you usually go home the day of the procedure. This is done to get your heart back into a normal rhythm. You are not awake for the procedure. Please see the instruction sheet given to you today.    Follow-Up: At [facility], you and your health needs are our priority.  As part of our continuing mission to provide you with exceptional heart care, we have created designated Provider Care Teams.  These Care Teams include your primary Cardiologist (physician) and Advanced Practice Providers (APPs -  Physician Assistants and Nurse Practitioners) who all work together to provide you with the care you need, when you need it.  Your next appointment:   4 week(s)  The format for your next appointment:   In Person  Provider:    You may see [name], MD or one of the following  Advanced Practice Providers on your designated Care Team:    Murray Hodgkins, NP  Christell Faith, PA-C  Marrianne Mood, PA-C   Other Instructions  You are scheduled for a Cardioversion on __12/7/20______________ with Dr._Arida__________ Please arrive at the [facility] at _________ a.m. on the day of your procedure.  DIET INSTRUCTIONS:  Nothing to eat or drink after midnight except your medications with a              sip of water.         1) Labs: __________________  2) Medications:  YOU MAY TAKE ALL of your remaining medications with a small amount of water.  3) Must have a responsible person to drive you home.  4) Bring a current list of your medications and current insurance cards.    If you have any questions after you get home, please call the office at [phone]

## 2019-08-08 NOTE — H&P (View-Only) (Signed)
Cardiology Office Note   Date:  08/08/2019   ID:  [name], DOB 09/11/1945, MRN [id number]  PCP:  [name], MD  Cardiologist:   [name], MD   Chief Complaint  Patient presents with  . office visit    Palpitations; Meeds verbally reviewed with patient.      History of Present Illness: [name] is a 73 y.o. female who presents for A follow-up visit regarding persistent atrial fibrillation/flutter and valvular heart disease. 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  Cardiac catheterization before valve replacement showed no obstructive coronary artery disease. Nuclear stress test in December 2018 showed no evidence of ischemia with normal ejection fraction. Echocardiogram in August 2019 showed normal LV systolic function with mild LVH, bioprosthetic aortic valve with normal function and no evidence of pulmonary hypertension. She had atrial flutter with tachycardia in December 2019.  She underwent successful cardioversion.  She had recurrent atrial flutter and thus I referred her to Dr. [name].  Given her COPD requiring oxygen and other comorbidities, ablation was not recommended.  Due to QRS widening, the dose of flecainide was decreased back to 50 mg twice daily.   Most recent cardioversion was done in September.  The patient seek a second opinion with Dr. [name] at [facility] who did not advise for a different antiarrhythmic medication.  Again ablation was felt to be high risk due to her lung disease.  There has been some discussion about possible AV nodal ablation and permanent pacemaker placement. The patient went into A. fib yesterday and had palpitations and increased shortness of breath.  Thus, she called our office and we  added her to the schedule today.  She denies chest pain.   Past Medical History:  Diagnosis Date  . 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 s/p bioprosthetic valve replacement 2008 at [facility];  b. 01/2015 Echo: EF 60-65%, no rwma, Gr1 DD, mildly dil LA, nl RV fxn.  . Atrial fibrillation with RVR ([place]) 01/11/2015  . Atrial flutter ([place])    a. 08/2016 s/p DCCV.  Remains on flecainide 50 mg bid.  . Basal skull fracture ([place]) 20 yrs ago  . Chronic respiratory failure ([place])   . COPD (chronic obstructive pulmonary disease) ([place])    a. on home O2 at 2L since 2008  . 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 Essential hypertension 01/24/2015  . History of cardiac cath    a. 2008 prior to Aortic aneurysm repair-->nl cors.  . History of stress test    a. 10/2015 MV: no ischemia/infarct.  [name] HLD (hyperlipidemia)   . HTN (hypertension)   . Hypothyroidism   . Obesity   . PAF (paroxysmal atrial fibrillation) ([name])    a. on Eliquis; b. CHADS2VASc = 3 (HTN, age x 1, female).  . Paroxysmal atrial fibrillation ([place]) 01/24/2015  . Right upper quadrant abdominal tenderness without rebound tenderness 03/02/2018  . S/P ascending aortic aneurysm repair 2008    Past Surgical History:  Procedure Laterality Date  . ABDOMINAL AORTIC ANEURYSM REPAIR  2008  . ABDOMINAL HYSTERECTOMY    . AORTIC VALVE REPLACEMENT  2008  . CARDIAC CATHETERIZATION  [place]  . CARDIOVERSION N/A 08/15/2018   Procedure: CARDIOVERSION;  Surgeon: [name], MD;  Location: [facility] ORS;  Service: Cardiovascular;  Laterality: N/A;  . CARDIOVERSION N/A 11/14/2018   Procedure: CARDIOVERSION (CATH LAB);  Surgeon: [name], MD;  Location: [facility] ORS;  Service: Cardiovascular;  Laterality: N/A;  . CARDIOVERSION N/A 06/05/2019   Procedure: CARDIOVERSION;  Surgeon: [name], MD;  Location: [facility] ORS;  Service: Cardiovascular;  Laterality: N/A;  . CARPAL TUNNEL RELEASE    . ELECTROPHYSIOLOGIC STUDY  N/A 08/17/2016   Procedure: Cardioversion;  Surgeon: [name], MD;  Location: [facility] ORS;  Service: Cardiovascular;  Laterality: N/A;  . TUMOR EXCISION Left    x3 (arm)     Current Outpatient Medications  Medication Sig Dispense Refill  . acetaminophen (TYLENOL) 500 MG tablet Take 500 mg by mouth every 4 (four) hours as needed for moderate pain or headache.     . albuterol (PROVENTIL HFA;VENTOLIN HFA) 108 (90 Base) MCG/ACT inhaler Inhale 2 puffs into the lungs every 6 (six) hours as needed for wheezing or shortness of breath. 3 Inhaler 3  . ALPRAZolam (XANAX) 0.25 MG tablet Take 2 tablets (0.5 mg total) by mouth 2 (two) times daily as needed for anxiety. 60 tablet 3  . apixaban (ELIQUIS) 5 MG TABS tablet Take 1 tablet (5 mg total) by mouth 2 (two) times daily. 180 tablet 3  . Artificial Tear Solution (GENTEAL TEARS) 0.1-0.2-0.3 % SOLN Place 1 drop into both eyes daily.    [name] atorvastatin (LIPITOR) 40 MG tablet 1 tab po qhs (Patient taking differently: Take 40 mg by mouth at bedtime. 1 tab po qhs) 90 tablet 1  . Coenzyme Q10 (COQ10) 200 MG CAPS Take 200 mg by mouth daily.    . diclofenac sodium (VOLTAREN) 1 % GEL To the affected joint (Patient taking differently: Apply 2-4 g topically 3 (three) times daily as needed (shoulder pain.). To the affected joint ) 100 Tube 3  . diltiazem (CARDIZEM CD) 120 MG 24 hr capsule Take 1 capsule (120 mg total) by mouth daily. 90 capsule 3  . DULoxetine (CYMBALTA) 60 MG capsule Take 1 capsule (60 mg total) by mouth daily. 90 capsule 1  . ferrous sulfate (FERROUSUL) 325 (65 FE) MG tablet Take 650 mg by mouth daily with breakfast.     . flecainide (TAMBOCOR) 50 MG tablet Take 1 tablet (50 mg total) by mouth 2 (two) times daily. 60 tablet 3  . hydrALAZINE (APRESOLINE) 25 MG tablet Take 1 tablet (25 mg total) by mouth 3 (three) times daily. 90 tablet 3  . levothyroxine (SYNTHROID) 25 MCG tablet Take 1 tablet (25 mcg total) by mouth daily before breakfast. 90  tablet 1  . lisinopril-hydrochlorothiazide (ZESTORETIC) 20-25 MG tablet Take 1 tablet by mouth daily. 90 tablet 0  . loratadine (CLARITIN) 10 MG tablet Take 10 mg by mouth daily as needed for allergies.     . OXYGEN Inhale into the lungs.    . pantoprazole (PROTONIX) 40 MG tablet Take 1 tablet (40 mg total) by mouth daily. 90 tablet 1  . potassium chloride (KLOR-CON) 10 MEQ tablet Take 2 tablets by mouth 2 (two) times daily.    . sodium chloride (OCEAN) 0.65 % SOLN nasal spray Place 1 spray into both nostrils daily as needed for congestion.    . Tiotropium Bromide-Olodaterol (STIOLTO RESPIMAT) 2.5-2.5 MCG/ACT AERS Inhale 1 puff into the lungs daily.    . traZODone (DESYREL) 50 MG tablet Take 1  tablet (50 mg total) by mouth at bedtime. 90 tablet 1   No current facility-administered medications for this visit.     Allergies:   Benadryl [diphenhydramine hcl (sleep)], Cetirizine, Diphenhydramine-zinc acetate, Lasix [furosemide], Levaquin [levofloxacin in d5w], Levofloxacin, Meloxicam, Soy allergy, and Sulfa antibiotics    Social History:  The patient  reports that she has quit smoking. Her smoking use included cigarettes. She has never used smokeless tobacco. She reports that she does not drink alcohol or use drugs.   Family History:  The patient's family history includes Stroke in her father and paternal grandmother.    ROS:  Please see the history of present illness.   Otherwise, review of systems are positive for none.   All other systems are reviewed and negative.    PHYSICAL EXAM: VS:  BP 120/80 (BP Location: Left Arm, Patient Position: Sitting, Cuff Size: Normal)   Pulse (!) 112   Ht 4\' 10"  (1.473 m)   Wt 184 lb 4 oz (83.6 kg)   SpO2 98%   BMI 38.51 kg/m  , BMI Body mass index is 38.51 kg/m. GEN: Well nourished, well developed, in no acute distress  HEENT: normal  Neck: no JVD, carotid bruits, or masses Cardiac: Irregularly irregular and mildly tachycardic; no  rubs, or  gallops,no edema . There  is a 2/6 systolic ejection murmur in the aortic area.  Respiratory:  clear to auscultation bilaterally, normal work of breathing GI: soft, nontender, nondistended, + BS MS: no deformity or atrophy  Skin: warm and dry, no rash Neuro:  Strength and sensation are intact Psych: euthymic mood, full affect   EKG:  EKG is ordered today. EKG showed coarse atrial fibrillation with RVR, LVH with QRS widening.  QRS duration is 126 ms.  Recent Labs: 10/31/2018: Magnesium 2.0 07/17/2019: ALT 13; BUN 14; Creatinine, Ser 0.79; Hemoglobin 12.1; Platelets 305; Potassium 4.3; Sodium 139; TSH 3.220    Lipid Panel    Component Value Date/Time   CHOL 127 07/17/2019 0928   TRIG 94 07/17/2019 0928   HDL 53 07/17/2019 0928   LDLCALC 56 07/17/2019 0928      Wt Readings from Last 3 Encounters:  08/08/19 184 lb 4 oz (83.6 kg)  07/10/19 182 lb (82.6 kg)  06/21/19 186 lb 8 oz (84.6 kg)         ASSESSMENT AND PLAN:  1.  Recurrent atrial fibrillation/flutter: She is currently in A. fib with RVR and she is usually very symptomatic during these episodes.  She is on long-term anticoagulation with Eliquis and has not missed any dose.  Not able to increase the dose of flecainide given QRS widening.  Other antiarrhythmic options are limited given her lung disease which makes amiodarone not a good option.  Dofetilide is not a good option given prolonged QT interval. Given the patient's symptoms, we will go ahead and schedule cardioversion on Monday.  The patient is considering AV nodal ablation and permanent pacemaker placement and she will continue to discuss this with Dr. [name].  2. Essential hypertension: Blood pressure is controlled.  3. History of aortic valve replacement with bioprosthetic valve: This was functioning normally on echo from last year..  4.  Recurrent chest pain in setting of stress and anxiety.  Stress test in the past was normal.   FU after cardioversion   Signed,  [name], MD  08/08/2019 4:35 PM    [place]

## 2019-08-08 NOTE — Progress Notes (Signed)
Cardiology Office Note   Date:  08/08/2019   ID:  [name], DOB 14-Aug-1946, MRN [id number]  PCP:  [name], MD  Cardiologist:   [name], MD   Chief Complaint  Patient presents with  . office visit    Palpitations; Meeds verbally reviewed with patient.      History of Present Illness: [name] is a 73 y.o. female who presents for A follow-up visit regarding persistent atrial fibrillation/flutter and valvular heart disease. 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  Cardiac catheterization before valve replacement showed no obstructive coronary artery disease. Nuclear stress test in December 2018 showed no evidence of ischemia with normal ejection fraction. Echocardiogram in August 2019 showed normal LV systolic function with mild LVH, bioprosthetic aortic valve with normal function and no evidence of pulmonary hypertension. She had atrial flutter with tachycardia in December 2019.  She underwent successful cardioversion.  She had recurrent atrial flutter and thus I referred her to Dr. [name].  Given her COPD requiring oxygen and other comorbidities, ablation was not recommended.  Due to QRS widening, the dose of flecainide was decreased back to 50 mg twice daily.   Most recent cardioversion was done in September.  The patient seek a second opinion with Dr. [name] at [facility] who did not advise for a different antiarrhythmic medication.  Again ablation was felt to be high risk due to her lung disease.  There has been some discussion about possible AV nodal ablation and permanent pacemaker placement. The patient went into A. fib yesterday and had palpitations and increased shortness of breath.  Thus, she called our office and we  added her to the schedule today.  She denies chest pain.   Past Medical History:  Diagnosis Date  . 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 s/p bioprosthetic valve replacement 2008 at [facility];  b. 01/2015 Echo: EF 60-65%, no rwma, Gr1 DD, mildly dil LA, nl RV fxn.  . Atrial fibrillation with RVR ([place]) 01/11/2015  . Atrial flutter ([place])    a. 08/2016 s/p DCCV.  Remains on flecainide 50 mg bid.  . Basal skull fracture ([place]) 20 yrs ago  . Chronic respiratory failure ([place])   . COPD (chronic obstructive pulmonary disease) ([place])    a. on home O2 at 2L since 2008  . 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 Essential hypertension 01/24/2015  . History of cardiac cath    a. 2008 prior to Aortic aneurysm repair-->nl cors.  . History of stress test    a. 10/2015 MV: no ischemia/infarct.  [name] HLD (hyperlipidemia)   . HTN (hypertension)   . Hypothyroidism   . Obesity   . PAF (paroxysmal atrial fibrillation) ([name])    a. on Eliquis; b. CHADS2VASc = 3 (HTN, age x 1, female).  . Paroxysmal atrial fibrillation ([place]) 01/24/2015  . Right upper quadrant abdominal tenderness without rebound tenderness 03/02/2018  . S/P ascending aortic aneurysm repair 2008    Past Surgical History:  Procedure Laterality Date  . ABDOMINAL AORTIC ANEURYSM REPAIR  2008  . ABDOMINAL HYSTERECTOMY    . AORTIC VALVE REPLACEMENT  2008  . CARDIAC CATHETERIZATION  [place]  . CARDIOVERSION N/A 08/15/2018   Procedure: CARDIOVERSION;  Surgeon: [name], MD;  Location: [facility] ORS;  Service: Cardiovascular;  Laterality: N/A;  . CARDIOVERSION N/A 11/14/2018   Procedure: CARDIOVERSION (CATH LAB);  Surgeon: [name], MD;  Location: [facility] ORS;  Service: Cardiovascular;  Laterality: N/A;  . CARDIOVERSION N/A 06/05/2019   Procedure: CARDIOVERSION;  Surgeon: [name], MD;  Location: [facility] ORS;  Service: Cardiovascular;  Laterality: N/A;  . CARPAL TUNNEL RELEASE    . ELECTROPHYSIOLOGIC STUDY  N/A 08/17/2016   Procedure: Cardioversion;  Surgeon: [name], MD;  Location: [facility] ORS;  Service: Cardiovascular;  Laterality: N/A;  . TUMOR EXCISION Left    x3 (arm)     Current Outpatient Medications  Medication Sig Dispense Refill  . acetaminophen (TYLENOL) 500 MG tablet Take 500 mg by mouth every 4 (four) hours as needed for moderate pain or headache.     . albuterol (PROVENTIL HFA;VENTOLIN HFA) 108 (90 Base) MCG/ACT inhaler Inhale 2 puffs into the lungs every 6 (six) hours as needed for wheezing or shortness of breath. 3 Inhaler 3  . ALPRAZolam (XANAX) 0.25 MG tablet Take 2 tablets (0.5 mg total) by mouth 2 (two) times daily as needed for anxiety. 60 tablet 3  . apixaban (ELIQUIS) 5 MG TABS tablet Take 1 tablet (5 mg total) by mouth 2 (two) times daily. 180 tablet 3  . Artificial Tear Solution (GENTEAL TEARS) 0.1-0.2-0.3 % SOLN Place 1 drop into both eyes daily.    [name] atorvastatin (LIPITOR) 40 MG tablet 1 tab po qhs (Patient taking differently: Take 40 mg by mouth at bedtime. 1 tab po qhs) 90 tablet 1  . Coenzyme Q10 (COQ10) 200 MG CAPS Take 200 mg by mouth daily.    . diclofenac sodium (VOLTAREN) 1 % GEL To the affected joint (Patient taking differently: Apply 2-4 g topically 3 (three) times daily as needed (shoulder pain.). To the affected joint ) 100 Tube 3  . diltiazem (CARDIZEM CD) 120 MG 24 hr capsule Take 1 capsule (120 mg total) by mouth daily. 90 capsule 3  . DULoxetine (CYMBALTA) 60 MG capsule Take 1 capsule (60 mg total) by mouth daily. 90 capsule 1  . ferrous sulfate (FERROUSUL) 325 (65 FE) MG tablet Take 650 mg by mouth daily with breakfast.     . flecainide (TAMBOCOR) 50 MG tablet Take 1 tablet (50 mg total) by mouth 2 (two) times daily. 60 tablet 3  . hydrALAZINE (APRESOLINE) 25 MG tablet Take 1 tablet (25 mg total) by mouth 3 (three) times daily. 90 tablet 3  . levothyroxine (SYNTHROID) 25 MCG tablet Take 1 tablet (25 mcg total) by mouth daily before breakfast. 90  tablet 1  . lisinopril-hydrochlorothiazide (ZESTORETIC) 20-25 MG tablet Take 1 tablet by mouth daily. 90 tablet 0  . loratadine (CLARITIN) 10 MG tablet Take 10 mg by mouth daily as needed for allergies.     . OXYGEN Inhale into the lungs.    . pantoprazole (PROTONIX) 40 MG tablet Take 1 tablet (40 mg total) by mouth daily. 90 tablet 1  . potassium chloride (KLOR-CON) 10 MEQ tablet Take 2 tablets by mouth 2 (two) times daily.    . sodium chloride (OCEAN) 0.65 % SOLN nasal spray Place 1 spray into both nostrils daily as needed for congestion.    . Tiotropium Bromide-Olodaterol (STIOLTO RESPIMAT) 2.5-2.5 MCG/ACT AERS Inhale 1 puff into the lungs daily.    . traZODone (DESYREL) 50 MG tablet Take 1  tablet (50 mg total) by mouth at bedtime. 90 tablet 1   No current facility-administered medications for this visit.     Allergies:   Benadryl [diphenhydramine hcl (sleep)], Cetirizine, Diphenhydramine-zinc acetate, Lasix [furosemide], Levaquin [levofloxacin in d5w], Levofloxacin, Meloxicam, Soy allergy, and Sulfa antibiotics    Social History:  The patient  reports that she has quit smoking. Her smoking use included cigarettes. She has never used smokeless tobacco. She reports that she does not drink alcohol or use drugs.   Family History:  The patient's family history includes Stroke in her father and paternal grandmother.    ROS:  Please see the history of present illness.   Otherwise, review of systems are positive for none.   All other systems are reviewed and negative.    PHYSICAL EXAM: VS:  BP 120/80 (BP Location: Left Arm, Patient Position: Sitting, Cuff Size: Normal)   Pulse (!) 112   Ht 4\' 10"  (1.473 m)   Wt 184 lb 4 oz (83.6 kg)   SpO2 98%   BMI 38.51 kg/m  , BMI Body mass index is 38.51 kg/m. GEN: Well nourished, well developed, in no acute distress  HEENT: normal  Neck: no JVD, carotid bruits, or masses Cardiac: Irregularly irregular and mildly tachycardic; no  rubs, or  gallops,no edema . There  is a 2/6 systolic ejection murmur in the aortic area.  Respiratory:  clear to auscultation bilaterally, normal work of breathing GI: soft, nontender, nondistended, + BS MS: no deformity or atrophy  Skin: warm and dry, no rash Neuro:  Strength and sensation are intact Psych: euthymic mood, full affect   EKG:  EKG is ordered today. EKG showed coarse atrial fibrillation with RVR, LVH with QRS widening.  QRS duration is 126 ms.  Recent Labs: 10/31/2018: Magnesium 2.0 07/17/2019: ALT 13; BUN 14; Creatinine, Ser 0.79; Hemoglobin 12.1; Platelets 305; Potassium 4.3; Sodium 139; TSH 3.220    Lipid Panel    Component Value Date/Time   CHOL 127 07/17/2019 0928   TRIG 94 07/17/2019 0928   HDL 53 07/17/2019 0928   LDLCALC 56 07/17/2019 0928      Wt Readings from Last 3 Encounters:  08/08/19 184 lb 4 oz (83.6 kg)  07/10/19 182 lb (82.6 kg)  06/21/19 186 lb 8 oz (84.6 kg)         ASSESSMENT AND PLAN:  1.  Recurrent atrial fibrillation/flutter: She is currently in A. fib with RVR and she is usually very symptomatic during these episodes.  She is on long-term anticoagulation with Eliquis and has not missed any dose.  Not able to increase the dose of flecainide given QRS widening.  Other antiarrhythmic options are limited given her lung disease which makes amiodarone not a good option.  Dofetilide is not a good option given prolonged QT interval. Given the patient's symptoms, we will go ahead and schedule cardioversion on Monday.  The patient is considering AV nodal ablation and permanent pacemaker placement and she will continue to discuss this with Dr. [name].  2. Essential hypertension: Blood pressure is controlled.  3. History of aortic valve replacement with bioprosthetic valve: This was functioning normally on echo from last year..  4.  Recurrent chest pain in setting of stress and anxiety.  Stress test in the past was normal.   FU after cardioversion   Signed,  [name], MD  08/08/2019 4:35 PM    [place]

## 2019-08-09 ENCOUNTER — Telehealth: Payer: Self-pay | Admitting: Cardiovascular Disease

## 2019-08-09 ENCOUNTER — Other Ambulatory Visit: Payer: Self-pay | Admitting: Internal Medicine

## 2019-08-09 NOTE — Telephone Encounter (Signed)
lmov to schedule    4 week fu cardioversion per checkout 08/08/19 [facility]

## 2019-08-10 ENCOUNTER — Other Ambulatory Visit: Payer: Self-pay

## 2019-08-10 ENCOUNTER — Other Ambulatory Visit
Admission: RE | Admit: 2019-08-10 | Discharge: 2019-08-10 | Disposition: A | Discharge status: Home / Self Care | Payer: Medicare Other | Source: Ambulatory Visit | Attending: Cardiovascular Disease | Admitting: Cardiovascular Disease

## 2019-08-10 DIAGNOSIS — I4891 Unspecified atrial fibrillation: Secondary | ICD-10-CM

## 2019-08-10 DIAGNOSIS — Z20828 Contact with and (suspected) exposure to other viral communicable diseases: Secondary | ICD-10-CM | POA: Diagnosis not present

## 2019-08-10 DIAGNOSIS — Z01812 Encounter for preprocedural laboratory examination: Secondary | ICD-10-CM | POA: Insufficient documentation

## 2019-08-10 LAB — BASIC METABOLIC PANEL
Anion gap: 10 (ref 5–15)
BUN: 14 mg/dL (ref 8–23)
CO2: 25 mmol/L (ref 22–32)
Calcium: 9 mg/dL (ref 8.9–10.3)
Chloride: 98 mmol/L (ref 98–111)
Creatinine, Ser: 0.75 mg/dL (ref 0.44–1.00)
GFR calc Af Amer: >60 mL/min (ref >60)
GFR calc non Af Amer: >60 mL/min (ref >60)
Glucose, Bld: 127 mg/dL — ABNORMAL HIGH (ref 70–99)
Potassium: 3.4 mmol/L — ABNORMAL LOW (ref 3.5–5.1)
Sodium: 133 mmol/L — ABNORMAL LOW (ref 135–145)

## 2019-08-10 LAB — CBC WITH DIFFERENTIAL/PLATELET
Abs Immature Granulocytes: 0.03 10*3/uL (ref 0.00–0.07)
Basophils Absolute: 0 10*3/uL (ref 0.0–0.1)
Basophils Relative: 1 %
Eosinophils Absolute: 0.1 10*3/uL (ref 0.0–0.5)
Eosinophils Relative: 1 %
HCT: 33.1 % — ABNORMAL LOW (ref 36.0–46.0)
Hemoglobin: 10.6 g/dL — ABNORMAL LOW (ref 12.0–15.0)
Immature Granulocytes: 0 %
Lymphocytes Relative: 17 %
Lymphs Abs: 1.2 10*3/uL (ref 0.7–4.0)
MCH: 26.6 pg (ref 26.0–34.0)
MCHC: 32 g/dL (ref 30.0–36.0)
MCV: 83 fL (ref 80.0–100.0)
Monocytes Absolute: 0.8 10*3/uL (ref 0.1–1.0)
Monocytes Relative: 11 %
Neutro Abs: 4.9 10*3/uL (ref 1.7–7.7)
Neutrophils Relative %: 70 %
Platelets: 308 10*3/uL (ref 150–400)
RBC: 3.99 MIL/uL (ref 3.87–5.11)
RDW: 17.2 % — ABNORMAL HIGH (ref 11.5–15.5)
WBC: 7 10*3/uL (ref 4.0–10.5)
nRBC: 0 % (ref 0.0–0.2)

## 2019-08-10 NOTE — Telephone Encounter (Signed)
DPR on file. lmom for patient to confirm her 08/14/19 dccv with Dr. Fletcher Anon. The procedure is scheduled for 7:30am. [facility] @ 7[facility]. Patient was previously given her written pre-procedure instructions. Patient is to contact the office if any questions.

## 2019-08-11 LAB — SARS CORONAVIRUS 2 (TAT 6-24 HRS): SARS Coronavirus 2: NEGATIVE

## 2019-08-11 NOTE — Telephone Encounter (Signed)
Called to give the patient lab results and Dr. Tyrell Antonio recommendation. [email or web address].

## 2019-08-11 NOTE — Telephone Encounter (Signed)
Patient made aware of lab results and Dr. Tyrell Antonio recommendation with verbalized understanding. Patient also confirmed the time of her 08/14/19 @ 7:30am dccv with Dr. Fletcher Anon

## 2019-08-11 NOTE — Telephone Encounter (Signed)
-----   Message from [name], MD sent at 08/11/2019  8:15 AM EST ----- Labs showed mild hypokalemia.  Please verify with her that she takes potassium chloride on a regular basis.  If she does, I recommend doubling the dose for 2 days then going back to her normal dose.

## 2019-08-11 NOTE — Telephone Encounter (Signed)
Patient returning call to confirm procedure appt and discuss labs

## 2019-08-13 ENCOUNTER — Encounter: Payer: Self-pay | Admitting: Anesthesiology

## 2019-08-14 ENCOUNTER — Ambulatory Visit: Payer: Medicare Other | Admitting: Anesthesiology

## 2019-08-14 ENCOUNTER — Ambulatory Visit
Admission: RE | Admit: 2019-08-14 | Discharge: 2019-08-14 | Disposition: A | Discharge status: Home / Self Care | Payer: Medicare Other | Attending: Cardiovascular Disease | Admitting: Cardiovascular Disease

## 2019-08-14 ENCOUNTER — Encounter
Admission: RE | Disposition: A | Discharge status: Home / Self Care | Payer: Self-pay | Source: Home / Self Care | Attending: Cardiovascular Disease

## 2019-08-14 ENCOUNTER — Telehealth: Payer: Self-pay | Admitting: Medical

## 2019-08-14 ENCOUNTER — Other Ambulatory Visit: Payer: Self-pay

## 2019-08-14 ENCOUNTER — Encounter: Payer: Self-pay | Admitting: Emergency Medicine

## 2019-08-14 DIAGNOSIS — Z953 Presence of xenogenic heart valve: Secondary | ICD-10-CM | POA: Insufficient documentation

## 2019-08-14 DIAGNOSIS — I4891 Unspecified atrial fibrillation: Secondary | ICD-10-CM | POA: Diagnosis not present

## 2019-08-14 DIAGNOSIS — Z87891 Personal history of nicotine dependence: Secondary | ICD-10-CM | POA: Diagnosis not present

## 2019-08-14 DIAGNOSIS — Z882 Allergy status to sulfonamides status: Secondary | ICD-10-CM | POA: Diagnosis not present

## 2019-08-14 DIAGNOSIS — E785 Hyperlipidemia, unspecified: Secondary | ICD-10-CM | POA: Insufficient documentation

## 2019-08-14 DIAGNOSIS — I447 Left bundle-branch block, unspecified: Secondary | ICD-10-CM | POA: Diagnosis not present

## 2019-08-14 DIAGNOSIS — Z888 Allergy status to other drugs, medicaments and biological substances status: Secondary | ICD-10-CM | POA: Insufficient documentation

## 2019-08-14 DIAGNOSIS — I10 Essential (primary) hypertension: Secondary | ICD-10-CM | POA: Insufficient documentation

## 2019-08-14 DIAGNOSIS — I4819 Other persistent atrial fibrillation: Secondary | ICD-10-CM | POA: Diagnosis not present

## 2019-08-14 DIAGNOSIS — Z881 Allergy status to other antibiotic agents status: Secondary | ICD-10-CM | POA: Diagnosis not present

## 2019-08-14 DIAGNOSIS — R002 Palpitations: Secondary | ICD-10-CM | POA: Insufficient documentation

## 2019-08-14 DIAGNOSIS — Z9981 Dependence on supplemental oxygen: Secondary | ICD-10-CM | POA: Diagnosis not present

## 2019-08-14 DIAGNOSIS — Z791 Long term (current) use of non-steroidal anti-inflammatories (NSAID): Secondary | ICD-10-CM | POA: Insufficient documentation

## 2019-08-14 DIAGNOSIS — F419 Anxiety disorder, unspecified: Secondary | ICD-10-CM | POA: Diagnosis not present

## 2019-08-14 DIAGNOSIS — Z90710 Acquired absence of both cervix and uterus: Secondary | ICD-10-CM | POA: Diagnosis not present

## 2019-08-14 DIAGNOSIS — I2510 Atherosclerotic heart disease of native coronary artery without angina pectoris: Secondary | ICD-10-CM | POA: Diagnosis not present

## 2019-08-14 DIAGNOSIS — I480 Paroxysmal atrial fibrillation: Secondary | ICD-10-CM | POA: Diagnosis not present

## 2019-08-14 DIAGNOSIS — Z91018 Allergy to other foods: Secondary | ICD-10-CM | POA: Diagnosis not present

## 2019-08-14 DIAGNOSIS — Z6831 Body mass index (BMI) 31.0-31.9, adult: Secondary | ICD-10-CM | POA: Diagnosis not present

## 2019-08-14 DIAGNOSIS — Z7901 Long term (current) use of anticoagulants: Secondary | ICD-10-CM | POA: Diagnosis not present

## 2019-08-14 DIAGNOSIS — I350 Nonrheumatic aortic (valve) stenosis: Secondary | ICD-10-CM | POA: Diagnosis not present

## 2019-08-14 DIAGNOSIS — I441 Atrioventricular block, second degree: Secondary | ICD-10-CM | POA: Diagnosis not present

## 2019-08-14 DIAGNOSIS — E039 Hypothyroidism, unspecified: Secondary | ICD-10-CM | POA: Insufficient documentation

## 2019-08-14 DIAGNOSIS — G4730 Sleep apnea, unspecified: Secondary | ICD-10-CM | POA: Insufficient documentation

## 2019-08-14 DIAGNOSIS — J449 Chronic obstructive pulmonary disease, unspecified: Secondary | ICD-10-CM | POA: Insufficient documentation

## 2019-08-14 DIAGNOSIS — Z823 Family history of stroke: Secondary | ICD-10-CM | POA: Insufficient documentation

## 2019-08-14 DIAGNOSIS — M1990 Unspecified osteoarthritis, unspecified site: Secondary | ICD-10-CM | POA: Insufficient documentation

## 2019-08-14 HISTORY — PX: CARDIOVERSION: SHX1299

## 2019-08-14 SURGERY — CARDIOVERSION
Anesthesia: General

## 2019-08-14 MED ORDER — SODIUM CHLORIDE 0.9 % IV SOLN
INTRAVENOUS | Status: DC | PRN
  Administered 2019-08-14: 07:00:00 via INTRAVENOUS

## 2019-08-14 MED ORDER — PROPOFOL 10 MG/ML IV BOLUS
INTRAVENOUS | Status: DC | PRN
  Administered 2019-08-14: 60 mg via INTRAVENOUS

## 2019-08-14 NOTE — CV Procedure (Signed)
Cardioversion note: A standard informed consent was obtained. Timeout was performed. The pads were placed in the anterior posterior fashion. The patient was given propofol by the anesthesia team.  Successful cardioversion was performed with a 100 J. The patient converted to sinus rhythm. There was about 3-5 seconds post conversion pause.  Pre-and post EKGs were reviewed. The patient tolerated the procedure with no immediate complications.  Recommendations: Continue same medications and follow-up in 2-3 weeks.

## 2019-08-14 NOTE — Anesthesia Postprocedure Evaluation (Signed)
Anesthesia Post Note  Patient: [name]  Procedure(s) Performed: CARDIOVERSION (N/A )  Patient location during evaluation: Specials Recovery Anesthesia Type: General Level of consciousness: awake and alert Pain management: pain level controlled Vital Signs Assessment: post-procedure vital signs reviewed and stable Respiratory status: spontaneous breathing, nonlabored ventilation, respiratory function stable and patient connected to nasal cannula oxygen Cardiovascular status: blood pressure returned to baseline and stable Postop Assessment: no apparent nausea or vomiting Anesthetic complications: no     Last Vitals:  Vitals:   08/14/19 0800 08/14/19 0815  BP: 122/77 108/81  Pulse: 88 90  Resp: (!) 22 20  Temp:    SpO2: 90% 96%    Last Pain:  Vitals:   08/14/19 0815  TempSrc:   PainSc: 0-No pain                 Tinna Kolker S

## 2019-08-14 NOTE — Transfer of Care (Signed)
Immediate Anesthesia Transfer of Care Note  Patient: [name]  Procedure(s) Performed: CARDIOVERSION (N/A )  Patient Location: Short Stay  Anesthesia Type:General  Level of Consciousness: awake  Airway & Oxygen Therapy: Patient connected to nasal cannula oxygen  Post-op Assessment: Post -op Vital signs reviewed and stable  Post vital signs: stable  Last Vitals:  Vitals Value Taken Time  BP 100/67 08/14/19 0740  Temp    Pulse 87 08/14/19 0741  Resp 16 08/14/19 0741  SpO2 93 % 08/14/19 0741  Vitals shown include unvalidated device data.  Last Pain:  Vitals:   08/14/19 0708  TempSrc: Oral  PainSc: 0-No pain         Complications: No apparent anesthesia complications

## 2019-08-14 NOTE — Interval H&P Note (Signed)
History and Physical Interval Note:  08/14/2019 8:26 AM  [name]  has presented today for surgery, with the diagnosis of Cardioversion   Afib.  The various methods of treatment have been discussed with the patient and family. After consideration of risks, benefits and other options for treatment, the patient has consented to  Procedure(s): CARDIOVERSION (N/A) as a surgical intervention.  The patient's history has been reviewed, patient examined, no change in status, stable for surgery.  I have reviewed the patient's chart and labs.  Questions were answered to the patient's satisfaction.     [name]

## 2019-08-14 NOTE — Anesthesia Preprocedure Evaluation (Signed)
Anesthesia Evaluation  Patient identified by MRN, date of birth, ID band Patient awake    Reviewed: Allergy & Precautions, NPO status , Patient's Chart, lab work & pertinent test results, reviewed documented beta blocker date and time   Airway Mallampati: III  TM Distance: >3 FB     Dental  (+) Chipped   Pulmonary sleep apnea , COPD, former smoker,           Cardiovascular hypertension, Pt. on medications + dysrhythmias Atrial Fibrillation      Neuro/Psych PSYCHIATRIC DISORDERS Anxiety TIA   GI/Hepatic   Endo/Other  Hypothyroidism Morbid obesity  Renal/GU      Musculoskeletal  (+) Arthritis ,   Abdominal   Peds  Hematology   Anesthesia Other Findings On O2 at home. Aortic valve replace.  Reproductive/Obstetrics                             Anesthesia Physical Anesthesia Plan  ASA: III  Anesthesia Plan: General   Post-op Pain Management:    Induction: Intravenous  PONV Risk Score and Plan:   Airway Management Planned:   Additional Equipment:   Intra-op Plan:   Post-operative Plan:   Informed Consent: I have reviewed the patients History and Physical, chart, labs and discussed the procedure including the risks, benefits and alternatives for the proposed anesthesia with the patient or authorized representative who has indicated his/her understanding and acceptance.       Plan Discussed with: CRNA  Anesthesia Plan Comments:         Anesthesia Quick Evaluation

## 2019-08-14 NOTE — Telephone Encounter (Signed)
I was paged about an outpatient call from Mrs. Kemler regarding her vital signs. I called her back and explained she just had successful DCCV with Dr. Fletcher Anon earlier today. A couple hours after arriving she decided to check her vitals (no symptoms prompted her to check it). BP was 146/85 pulse was 93. She was worried she had converted back into afib. She denied lightheadedness or dizziness. No chest pain. She has chronic sob at baseline from COPD but did not feel worsened SOB. I let her know if she began to feel severely symptomatic or rates are noted to be high she might need to be evaluated in the ED. Patient voiced understanding. She said she would prefer to see Dr Fletcher Anon in the office sometime this week.   Roshelle Traub Kathlen Mody, PA-C

## 2019-08-14 NOTE — Anesthesia Post-op Follow-up Note (Signed)
Anesthesia QCDR form completed.        

## 2019-08-15 ENCOUNTER — Telehealth: Payer: Self-pay | Admitting: Cardiovascular Disease

## 2019-08-15 ENCOUNTER — Encounter: Payer: Self-pay | Admitting: Cardiovascular Disease

## 2019-08-15 ENCOUNTER — Telehealth: Payer: Self-pay | Admitting: Internal Medicine

## 2019-08-15 NOTE — Telephone Encounter (Signed)
Pt c/o BP issue: STAT if pt c/o blurred vision, one-sided weakness or slurred speech  1. What are your last 5 BP readings? Most recent   172/98 HR 89  2. Are you having any other symptoms (ex. Dizziness, headache, blurred vision, passed out)? Palpitations   3. What is your BP issue?  Patient had failed cardioversion yesterday and is concerned about htn    Please call.

## 2019-08-15 NOTE — Telephone Encounter (Signed)
Spoke with the patient.  Patient sts that her BP and HR readings below were prior to her taking her morning medications.  Patient sts that after medications today her BP 165/93 84 bpm. Patient sts that her HR has "settled down." Pt denies palpitations, her sob is at her baseline.  Patient sts that she thinks that after her dccv yesterday she was moving around to much. Her HR would be in the 90's with ambulation. She spoke with the on call provider and a msg was sent to Dr. Fletcher Anon.  Patient sts that she does not think she is in AFIB at this time. She will f/u as planned and will contact the office sooner if cardiac symptoms develop. Advised the patient that I will fwd the update to Dr. Fletcher Anon and call back if he has additional recommendations.  Patient is agreeable with poc and voiced appreciation for the call back.

## 2019-08-25 ENCOUNTER — Other Ambulatory Visit: Payer: Self-pay

## 2019-08-25 ENCOUNTER — Other Ambulatory Visit: Payer: Self-pay | Admitting: Adult Health

## 2019-08-25 MED ORDER — STIOLTO RESPIMAT 2.5-2.5 MCG/ACT IN AERS: 1.0000 | INHALATION_SPRAY | Freq: Every day | RESPIRATORY_TRACT | 4 refills | Status: DC

## 2019-08-25 MED ORDER — TRELEGY ELLIPTA 100-62.5-25 MCG/INH IN AEPB: 1.0000 | INHALATION_SPRAY | Freq: Every day | RESPIRATORY_TRACT | 1 refills | Status: DC

## 2019-08-25 NOTE — Progress Notes (Signed)
Pt can not afford Stioloto, sent RX for Trelegy at this time.

## 2019-08-28 ENCOUNTER — Other Ambulatory Visit: Payer: Self-pay | Admitting: Internal Medicine

## 2019-09-02 DIAGNOSIS — J449 Chronic obstructive pulmonary disease, unspecified: Secondary | ICD-10-CM | POA: Diagnosis not present

## 2019-09-14 ENCOUNTER — Ambulatory Visit: Payer: Medicare Other | Admitting: Physician Assistant

## 2019-09-14 ENCOUNTER — Telehealth: Payer: Self-pay

## 2019-09-14 NOTE — Telephone Encounter (Signed)
Confirmed pt appt for 09/19/2019. [name]

## 2019-09-18 DIAGNOSIS — M19012 Primary osteoarthritis, left shoulder: Secondary | ICD-10-CM | POA: Diagnosis not present

## 2019-09-19 ENCOUNTER — Encounter: Payer: Self-pay | Admitting: Internal Medicine

## 2019-09-19 ENCOUNTER — Other Ambulatory Visit: Payer: Self-pay

## 2019-09-19 ENCOUNTER — Ambulatory Visit: Payer: Medicare Other | Admitting: Internal Medicine

## 2019-09-19 VITALS — BP 133/62 | HR 88 | Temp 97.8°F | Resp 16 | Ht <= 58 in | Wt 186.0 lb

## 2019-09-19 DIAGNOSIS — Z9981 Dependence on supplemental oxygen: Secondary | ICD-10-CM

## 2019-09-19 DIAGNOSIS — R0602 Shortness of breath: Secondary | ICD-10-CM

## 2019-09-19 DIAGNOSIS — G4733 Obstructive sleep apnea (adult) (pediatric): Secondary | ICD-10-CM | POA: Diagnosis not present

## 2019-09-19 DIAGNOSIS — J9611 Chronic respiratory failure with hypoxia: Secondary | ICD-10-CM

## 2019-09-19 DIAGNOSIS — Z9989 Dependence on other enabling machines and devices: Secondary | ICD-10-CM

## 2019-09-19 NOTE — Progress Notes (Signed)
[facility] [place], [place] [address]  Pulmonary Sleep Medicine   Office Visit Note  Patient Name: [name] DOB: Jul 30, 1946 MRN [id number]  Date of Service: 09/19/2019  Complaints/HPI: Patient is here today for pulmonary follow-up. She had spirometry today, her FEV1 today is 1.1. She complains of increased shortness of breath and a cough. Due to cost she has been on a few different maintenance inhalers and most recently has not been using a daily inhaler and relying on her albuterol inhaler multiple times a day. Requesting that we give her samples of inhalers today. Wear continuous oxygen 2.5LPM via [place] during the day and wears CPAP at night with 2.5LPM through CPAP machine. Was in the hospital last month for recurrent A.fib and was cardioverted into a NSR. Being followed closely by cardiology as this was the third cardioversion she has required in 12 months. Denies chest pain or palpitations.  ROS  General: (-) fever, (-) chills, (-) night sweats, (-) weakness Skin: (-) rashes, (-) itching,. Eyes: (-) visual changes, (-) redness, (-) itching. Nose and Sinuses: (-) nasal stuffiness or itchiness, (-) postnasal drip, (-) nosebleeds, (-) sinus trouble. Mouth and Throat: (-) sore throat, (-) hoarseness. Neck: (-) swollen glands, (-) enlarged thyroid, (-) neck pain. Respiratory: + cough, (-) bloody sputum, + shortness of breath, - wheezing. Cardiovascular: - ankle swelling, (-) chest pain. Lymphatic: (-) lymph node enlargement. Neurologic: (-) numbness, (-) tingling. Psychiatric: (-) anxiety, (-) depression   Current Medication: Outpatient Encounter Medications as of 09/19/2019  Medication Sig  . acetaminophen (TYLENOL) 500 MG tablet Take 500 mg by mouth every 4 (four) hours as needed for moderate pain or headache.   . albuterol (PROVENTIL HFA;VENTOLIN HFA) 108 (90 Base) MCG/ACT inhaler Inhale 2 puffs into the lungs every 6 (six) hours as needed for  wheezing or shortness of breath.  . ALPRAZolam (XANAX) 0.25 MG tablet Take 2 tablets (0.5 mg total) by mouth 2 (two) times daily as needed for anxiety.  [name] apixaban (ELIQUIS) 5 MG TABS tablet Take 1 tablet (5 mg total) by mouth 2 (two) times daily.  . Artificial Tear Solution (GENTEAL TEARS) 0.1-0.2-0.3 % SOLN Place 1 drop into both eyes daily.  [name] atorvastatin (LIPITOR) 40 MG tablet 1 tab po qhs (Patient taking differently: Take 40 mg by mouth at bedtime. 1 tab po qhs)  . Coenzyme Q10 (COQ10) 200 MG CAPS Take 200 mg by mouth daily.  . diclofenac sodium (VOLTAREN) 1 % GEL To the affected joint (Patient taking differently: Apply 2-4 g topically 3 (three) times daily as needed (shoulder pain.). To the affected joint )  . diltiazem (CARDIZEM CD) 120 MG 24 hr capsule Take 1 capsule (120 mg total) by mouth daily.  . DULoxetine (CYMBALTA) 60 MG capsule Take 1 capsule (60 mg total) by mouth daily.  . ferrous sulfate (FERROUSUL) 325 (65 FE) MG tablet Take 650 mg by mouth daily with breakfast.   . flecainide (TAMBOCOR) 50 MG tablet Take 1 tablet by mouth twice daily.  . Fluticasone-Umeclidin-Vilant (TRELEGY ELLIPTA) 100-62.5-25 MCG/INH AEPB Inhale 1 puff into the lungs daily.  . hydrALAZINE (APRESOLINE) 25 MG tablet Take 1 tablet (25 mg total) by mouth 3 (three) times daily.  [name] levothyroxine (SYNTHROID) 25 MCG tablet Take 1 tablet (25 mcg total) by mouth daily before breakfast.  . lisinopril-hydrochlorothiazide (ZESTORETIC) 20-25 MG tablet Take 1 tablet by mouth daily.  [name] loratadine (CLARITIN) 10 MG tablet Take 10 mg by mouth daily as needed for  allergies.   . OXYGEN Inhale into the lungs.  . pantoprazole (PROTONIX) 40 MG tablet Take 1 tablet (40 mg total) by mouth daily.  . potassium chloride (KLOR-CON) 10 MEQ tablet Take 2 tablets by mouth 2 (two) times daily.  . sodium chloride (OCEAN) 0.65 % SOLN nasal spray Place 1 spray into both nostrils daily as needed for congestion.  . Tiotropium  Bromide-Olodaterol (STIOLTO RESPIMAT) 2.5-2.5 MCG/ACT AERS Inhale 1 puff into the lungs daily.  . traZODone (DESYREL) 50 MG tablet Take 1 tablet (50 mg total) by mouth at bedtime.  . [DISCONTINUED] flecainide (TAMBOCOR) 50 MG tablet Take 1 tablet (50 mg total) by mouth 2 (two) times daily.  . [DISCONTINUED] hydrALAZINE (APRESOLINE) 25 MG tablet Take 1 tablet (25 mg total) by mouth 2 (two) times daily.  . [DISCONTINUED] HYDROcodone-acetaminophen (NORCO/VICODIN) 5-325 MG tablet Take 1 tablet by mouth every 6 (six) hours as needed for moderate pain. (Patient not taking: Reported on 08/08/2019)  . [DISCONTINUED] levothyroxine (SYNTHROID) 25 MCG tablet Take 1 tablet (25 mcg total) by mouth daily before breakfast.  . [DISCONTINUED] lisinopril-hydrochlorothiazide (ZESTORETIC) 20-25 MG tablet Take 1 tablet by mouth daily.  . [DISCONTINUED] pantoprazole (PROTONIX) 40 MG tablet Take 1 tablet (40 mg total) by mouth daily.  . [DISCONTINUED] potassium chloride SA (K-DUR,KLOR-CON) 20 MEQ tablet Take 1 tablet (20 mEq total) by mouth 2 (two) times daily. (Patient not taking: Reported on 06/21/2019)  . [DISCONTINUED] Tiotropium Bromide-Olodaterol (STIOLTO RESPIMAT) 2.5-2.5 MCG/ACT AERS Inhale 1 puff into the lungs daily.  . [DISCONTINUED] triamcinolone (KENALOG) 0.025 % cream Apply 1 application topically 2 (two) times daily. (Patient not taking: Reported on 08/08/2019)   No facility-administered encounter medications on file as of 09/19/2019.    Surgical History: Past Surgical History:  Procedure Laterality Date  . ABDOMINAL AORTIC ANEURYSM REPAIR  2008  . ABDOMINAL HYSTERECTOMY    . AORTIC VALVE REPLACEMENT  2008  . CARDIAC CATHETERIZATION     [facility]  . CARDIOVERSION N/A 08/15/2018   Procedure: CARDIOVERSION;  Surgeon: [name], MD;  Location: [facility] ORS;  Service: Cardiovascular;  Laterality: N/A;  . CARDIOVERSION N/A 11/14/2018   Procedure: CARDIOVERSION (CATH LAB);  Surgeon: [name], MD;   Location: [facility] ORS;  Service: Cardiovascular;  Laterality: N/A;  . CARDIOVERSION N/A 06/05/2019   Procedure: CARDIOVERSION;  Surgeon: [name], MD;  Location: [facility] ORS;  Service: Cardiovascular;  Laterality: N/A;  . CARDIOVERSION N/A 08/14/2019   Procedure: CARDIOVERSION;  Surgeon: [name], MD;  Location: [facility] ORS;  Service: Cardiovascular;  Laterality: N/A;  . CARPAL TUNNEL RELEASE    . ELECTROPHYSIOLOGIC STUDY N/A 08/17/2016   Procedure: Cardioversion;  Surgeon: [name], MD;  Location: [facility] ORS;  Service: Cardiovascular;  Laterality: N/A;  . TUMOR EXCISION Left    x3 (arm)    Medical History: Past Medical History:  Diagnosis Date  . 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 s/p bioprosthetic valve replacement 2008 at [facility];  b. 01/2015 Echo: EF 60-65%, no rwma, Gr1 DD, mildly dil LA, nl RV fxn.  . Atrial fibrillation with RVR ([place]) 01/11/2015  . Atrial flutter ([place])    a. 08/2016 s/p DCCV.  Remains on flecainide 50 mg bid.  . Basal skull fracture ([place]) 20 yrs ago  . Chronic respiratory failure ([place])   . COPD (chronic obstructive pulmonary disease) ([place])    a. on home O2 at 2L since 2008  . 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 Essential hypertension 01/24/2015  . History of cardiac cath    a. 2008 prior to Aortic aneurysm repair-->nl cors.  . History of stress test    a. 10/2015 MV: no ischemia/infarct.  [name] HLD (hyperlipidemia)   . HTN (hypertension)   . Hypothyroidism   . Obesity   . PAF (paroxysmal atrial fibrillation) ([name])    a. on Eliquis; b. CHADS2VASc = 3 (HTN, age x 1, female).  . Paroxysmal atrial fibrillation ([place]) 01/24/2015  . Right upper quadrant abdominal tenderness without rebound tenderness 03/02/2018  . S/P ascending aortic aneurysm repair 2008    Family History: Family History  Problem Relation Age of Onset  . Stroke Father   . Stroke Paternal Grandmother     Social History: Social History    Socioeconomic History  . Marital status: Single    Spouse name: Not on file  . Number of children: Not on file  . Years of education: Not on file  . Highest education level: Not on file  Occupational History  . Not on file  Tobacco Use  . Smoking status: Former Smoker    Types: Cigarettes  . Smokeless tobacco: Never Used  Substance and Sexual Activity  . Alcohol use: No  . Drug use: No  . Sexual activity: Never    Birth control/protection: Surgical  Other Topics Concern  . Not on file  Social History Narrative  . Not on file   Social Determinants of Health   Financial Resource Strain:   . Difficulty of Paying Living Expenses: Not on file  Food Insecurity:   . Worried About Charity fundraiser in the Last Year: Not on file  . Ran Out of Food in the Last Year: Not on file  Transportation Needs:   . Lack of Transportation (Medical): Not on file  . Lack of Transportation (Non-Medical): Not on file  Physical Activity:   . Days of Exercise per Week: Not on file  . Minutes of Exercise per Session: Not on file  Stress:   . Feeling of Stress : Not on file  Social Connections:   . Frequency of Communication with Friends and Family: Not on file  . Frequency of Social Gatherings with Friends and Family: Not on file  . Attends Religious Services: Not on file  . Active Member of Clubs or Organizations: Not on file  . Attends Archivist Meetings: Not on file  . Marital Status: Not on file  Intimate Partner Violence:   . Fear of Current or Ex-Partner: Not on file  . Emotionally Abused: Not on file  . Physically Abused: Not on file  . Sexually Abused: Not on file    Vital Signs: Blood pressure 133/62, pulse 88, temperature 97.8 F (36.6 C), resp. rate 16, height 4\' 10"  (1.473 m), weight 186 lb (84.4 kg), SpO2 (!) 88 %.  Examination: General Appearance: The patient is well-developed, well-nourished, and in no distress. Skin: Gross inspection of skin  unremarkable. Head: normocephalic, no gross deformities. Eyes: no gross deformities noted. ENT: ears appear grossly normal no exudates. Neck: Supple. No thyromegaly. No LAD. Respiratory: Diminished lung sounds bilaterally, dry persistent cough. Cardiovascular: Normal S1 and S2 without murmur or rub. Extremities: No cyanosis. pulses are equal. Neurologic: Alert and oriented. No involuntary movements.  LABS: Recent Results (from the past 2160 hour(s))  UA/M w/rflx Culture, Routine     Status: None   Collection Time: 07/10/19  4:09 PM  Specimen: Urine   URINE  Result Value Ref Range   Specific Gravity, UA 1.014 1.005 - 1.030   pH, UA 7.5 5.0 - 7.5   Color, UA Yellow Yellow   Appearance Ur Clear Clear   Leukocytes,UA Negative Negative   Protein,UA Negative Negative/Trace   Glucose, UA Negative Negative   Ketones, UA Negative Negative   RBC, UA Negative Negative   Bilirubin, UA Negative Negative   Urobilinogen, Ur 0.2 0.2 - 1.0 mg/dL   Nitrite, UA Negative Negative   Microscopic Examination Comment     Comment: Microscopic follows if indicated.   Microscopic Examination See below:     Comment: Microscopic was indicated and was performed.   Urinalysis Reflex Comment     Comment: This specimen will not reflex to a Urine Culture.  Microscopic Examination     Status: None   Collection Time: 07/10/19  4:09 PM   URINE  Result Value Ref Range   WBC, UA 0-5 0 - 5 /hpf   RBC 0-2 0 - 2 /hpf   Epithelial Cells (non renal) 0-10 0 - 10 /hpf   Casts None seen None seen /lpf   Mucus, UA Present Not Estab.   Bacteria, UA Few None seen/Few  Comprehensive metabolic panel     Status: None   Collection Time: 07/17/19  9:28 AM  Result Value Ref Range   Glucose 99 65 - 99 mg/dL   BUN 14 8 - 27 mg/dL   Creatinine, Ser 0.79 0.57 - 1.00 mg/dL   GFR calc non Af Amer 76 >59 mL/min/1.73   GFR calc Af Amer 87 >59 mL/min/1.73   BUN/Creatinine Ratio 18 12 - 28   Sodium 139 134 - 144 mmol/L    Potassium 4.3 3.5 - 5.2 mmol/L   Chloride 98 96 - 106 mmol/L   CO2 25 20 - 29 mmol/L   Calcium 9.3 8.7 - 10.3 mg/dL   Total Protein 6.4 6.0 - 8.5 g/dL   Albumin 4.1 3.7 - 4.7 g/dL   Globulin, Total 2.3 1.5 - 4.5 g/dL   Albumin/Globulin Ratio 1.8 1.2 - 2.2   Bilirubin Total 0.2 0.0 - 1.2 mg/dL   Alkaline Phosphatase 75 39 - 117 IU/L   AST 12 0 - 40 IU/L   ALT 13 0 - 32 IU/L  CBC     Status: Abnormal   Collection Time: 07/17/19  9:28 AM  Result Value Ref Range   WBC 5.5 3.4 - 10.8 x10E3/uL   RBC 4.52 3.77 - 5.28 x10E6/uL   Hemoglobin 12.1 11.1 - 15.9 g/dL   Hematocrit 36.6 34.0 - 46.6 %   MCV 81 79 - 97 fL   MCH 26.8 26.6 - 33.0 pg   MCHC 33.1 31.5 - 35.7 g/dL   RDW 16.2 (H) 11.7 - 15.4 %   Platelets 305 150 - 450 x10E3/uL  Lipid Panel w/o Chol/HDL Ratio     Status: None   Collection Time: 07/17/19  9:28 AM  Result Value Ref Range   Cholesterol, Total 127 100 - 199 mg/dL   Triglycerides 94 0 - 149 mg/dL   HDL 53 >39 mg/dL   VLDL Cholesterol Cal 18 5 - 40 mg/dL   LDL Chol Calc (NIH) 56 0 - 99 mg/dL  Iron and TIBC     Status: Abnormal   Collection Time: 07/17/19  9:28 AM  Result Value Ref Range   Total Iron Binding Capacity 357 250 - 450 ug/dL   UIBC 312 118 -  369 ug/dL   Iron 45 27 - 139 ug/dL   Iron Saturation 13 (L) 15 - 55 %  B12 and Folate Panel     Status: None   Collection Time: 07/17/19  9:28 AM  Result Value Ref Range   Vitamin B-12 556 232 - 1,245 pg/mL   Folate 17.4 >3.0 ng/mL    Comment: A serum folate concentration of less than 3.1 ng/mL is considered to represent clinical deficiency.   Hgb A1c w/o eAG     Status: Abnormal   Collection Time: 07/17/19  9:28 AM  Result Value Ref Range   Hgb A1c MFr Bld 5.7 (H) 4.8 - 5.6 %    Comment:          Prediabetes: 5.7 - 6.4          Diabetes: >6.4          Glycemic control for adults with diabetes: <7.0   T4, free     Status: None   Collection Time: 07/17/19  9:28 AM  Result Value Ref Range   Free T4 1.49 0.82  - 1.77 ng/dL  TSH     Status: None   Collection Time: 07/17/19  9:28 AM  Result Value Ref Range   TSH 3.220 0.450 - 4.500 uIU/mL  VITAMIN D 25 Hydroxy (Vit-D Deficiency, Fractures)     Status: None   Collection Time: 07/17/19  9:28 AM  Result Value Ref Range   Vit D, 25-Hydroxy 32.8 30.0 - 100.0 ng/mL    Comment: Vitamin D deficiency has been defined by the [place] and an Endocrine Society practice guideline as a level of serum 25-OH vitamin D less than 20 ng/mL (1,2). The Endocrine Society went on to further define vitamin D insufficiency as a level between 21 and 29 ng/mL (2). 1. IOM (Institute of Medicine). 2010. Dietary reference    intakes for calcium and D. [place]: The    [employer]. 2. Holick MF, Binkley [place], Bischoff-Ferrari HA, et al.    Evaluation, treatment, and prevention of vitamin D    deficiency: an Endocrine Society clinical practice    guideline. JCEM. 2011 Jul; 96(7):1911-30.   Ferritin     Status: None   Collection Time: 07/17/19  9:28 AM  Result Value Ref Range   Ferritin 73 15 - 150 ng/mL  SARS CORONAVIRUS 2 (TAT 6-24 HRS) Nasopharyngeal Nasopharyngeal Swab     Status: None   Collection Time: 08/10/19  1:38 PM   Specimen: Nasopharyngeal Swab  Result Value Ref Range   SARS Coronavirus 2 NEGATIVE NEGATIVE    Comment: (NOTE) SARS-CoV-2 target nucleic acids are NOT DETECTED. The SARS-CoV-2 RNA is generally detectable in upper and lower respiratory specimens during the acute phase of infection. Negative results do not preclude SARS-CoV-2 infection, do not rule out co-infections with other pathogens, and should not be used as the sole basis for treatment or other patient management decisions. Negative results must be combined with clinical observations, patient history, and epidemiological information. The expected result is Negative. Fact Sheet for Patients: SugarRoll.be Fact Sheet for Healthcare  Providers: https://www.woods-mathews.com/ This test is not yet approved or cleared by the [place] FDA and  has been authorized for detection and/or diagnosis of SARS-CoV-2 by FDA under an Emergency Use Authorization (EUA). This EUA will remain  in effect (meaning this test can be used) for the duration of the COVID-19 declaration under Section 56 4(b)(1) of the Act, 21 U.S.C. section 360bbb-3(b)(1), unless the  authorization is terminated or revoked sooner. Performed at [facility], [place] [address], [place], [place] [address]   CBC with Differential/Platelet     Status: Abnormal   Collection Time: 08/10/19  2:17 PM  Result Value Ref Range   WBC 7.0 4.0 - 10.5 K/uL   RBC 3.99 3.87 - 5.11 MIL/uL   Hemoglobin 10.6 (L) 12.0 - 15.0 g/dL   HCT 33.1 (L) 36.0 - 46.0 %   MCV 83.0 80.0 - 100.0 fL   MCH 26.6 26.0 - 34.0 pg   MCHC 32.0 30.0 - 36.0 g/dL   RDW 17.2 (H) 11.5 - 15.5 %   Platelets 308 150 - 400 K/uL   nRBC 0.0 0.0 - 0.2 %   Neutrophils Relative % 70 %   Neutro Abs 4.9 1.7 - 7.7 K/uL   Lymphocytes Relative 17 %   Lymphs Abs 1.2 0.7 - 4.0 K/uL   Monocytes Relative 11 %   Monocytes Absolute 0.8 0.1 - 1.0 K/uL   Eosinophils Relative 1 %   Eosinophils Absolute 0.1 0.0 - 0.5 K/uL   Basophils Relative 1 %   Basophils Absolute 0.0 0.0 - 0.1 K/uL   Immature Granulocytes 0 %   Abs Immature Granulocytes 0.03 0.00 - 0.07 K/uL    Comment: Performed at [facility], [address] [place], [place] [address]  Basic metabolic panel     Status: Abnormal   Collection Time: 08/10/19  2:17 PM  Result Value Ref Range   Sodium 133 (L) 135 - 145 mmol/L   Potassium 3.4 (L) 3.5 - 5.1 mmol/L   Chloride 98 98 - 111 mmol/L   CO2 25 22 - 32 mmol/L   Glucose, Bld 127 (H) 70 - 99 mg/dL   BUN 14 8 - 23 mg/dL   Creatinine, Ser 0.75 0.44 - 1.00 mg/dL   Calcium 9.0 8.9 - 10.3 mg/dL   GFR calc non Af Amer >60 >60 mL/min   GFR calc Af Amer >60 >60 mL/min   Anion gap 10  5 - 15    Comment: Performed at [facility], [address], [place], [place] [address]    Radiology: No results found.  No results found.  No results found.    Assessment and Plan: Patient Active Problem List   Diagnosis Date Noted  . Persistent atrial fibrillation ([place])   . Encounter for general adult medical examination with abnormal findings 07/10/2019  . Encounter for screening mammogram for malignant neoplasm of breast 07/10/2019  . Atopic dermatitis 05/02/2019  . Paroxysmal atrial flutter ([place]) 08/11/2018  . Encounter for long-term (current) use of medications 07/09/2018  . Chronic left shoulder pain 07/09/2018  . Conjunctivitis 07/09/2018  . Oxygen dependent 07/09/2018  . GAD (generalized anxiety disorder) 07/09/2018  . Ovarian failure 07/09/2018  . Positive colorectal cancer screening using Cologuard test 04/24/2018  . Calculus of gallbladder with acute on chronic cholecystitis 04/08/2018  . H/O: CVA (cerebrovascular accident) 04/08/2018  . Dysuria 03/02/2018  . Urinary tract infection without hematuria 03/02/2018  . Right upper quadrant abdominal tenderness without rebound tenderness 03/02/2018  . 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 exacerbation ([place]) 03/02/2018  . Chronic obstructive pulmonary disease ([place]) 09/19/2017  . Typical atrial flutter ([place])   . Irritable bowel syndrome without diarrhea 01/29/2015  . Essential hypertension 01/24/2015  . Paroxysmal atrial fibrillation ([place]) 01/24/2015  . History of aortic valve replacement with bioprosthetic valve 01/24/2015  . Atrial fibrillation with RVR ([place]) 01/11/2015  . 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 Impingement syndrome of shoulder 05/18/2014  . Cellulitis and abscess 02/13/2014  . MRSA (methicillin resistant Staphylococcus aureus) 02/13/2014  . Aneurysm, ascending aorta ([place]) 06/23/2013  . Bicuspid aortic valve 06/23/2013     1. OSA on CPAP Reports  nightly compliance with her CPAP machine, also requires oxygen at 2.5LPM through her CPAP nightly. She reports no issues with seal leaking, cleans machine appropriately and changes tubing and filter as directed.  2. Chronic respiratory failure with hypoxia ([name]) Samples of Anoro and Trelegy provided to patient today while in office. Struggles with being able to afford maintenance inhalers and relies heavily on her albuterol inhaler.  3. Oxygen dependent Continuous 2.5-3LPM via [place] at all times throughout the day. Reports she feels as though this is sufficient and only titrates up to 3LPM with strenuous activities such as vacuuming and household chores.  General Counseling: I have discussed the findings of the evaluation and examination with [name].  I have also discussed any further diagnostic evaluation thatmay be needed or ordered today. [name] verbalizes understanding of the findings of todays visit. We also reviewed her medications today and discussed drug interactions and side effects including but not limited excessive drowsiness and altered mental states. We also discussed that there is always a risk not just to her but also people around her. she has been encouraged to call the office with any questions or concerns that should arise related to todays visit. 4. Shortness of breath - Spirometry with graph  Orders Placed This Encounter  Procedures  . Spirometry with graph    Order Specific Question:   Where should this test be performed?    Answer:   [facility]     Time spent: 20 This patient was seen by Orson Gear AGNP-C in Collaboration with Dr. Devona Konig as a part of collaborative care agreement.  I have personally obtained a history, examined the patient, evaluated laboratory and imaging results, formulated the assessment and plan and placed orders.    Allyne Gee, MD [facility] Pulmonary and Critical Care Sleep medicine

## 2019-09-20 ENCOUNTER — Other Ambulatory Visit: Payer: Self-pay

## 2019-09-20 ENCOUNTER — Telehealth: Payer: Self-pay | Admitting: Cardiovascular Disease

## 2019-09-20 MED ORDER — POTASSIUM CHLORIDE ER 10 MEQ PO TBCR: 20.0000 meq | EXTENDED_RELEASE_TABLET | Freq: Two times a day (BID) | ORAL | 2 refills | Status: DC

## 2019-09-20 NOTE — Progress Notes (Deleted)
Cardiology Office Note    Date:  09/20/2019   ID:  [name], DOB 17-Mar-1946, MRN [id number]  PCP:  [name], MD  Cardiologist:  [name], MD  Electrophysiologist:  None   Chief Complaint: Follow-up  History of Present Illness:   [name] is a 74 y.o. female with history of persistent A. 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  Prior cardiac cath prior to her aortic valve replacement and graft repair of a sending aortic aneurysm showed no obstructive CAD.  Nuclear stress test in 08/2017 showed no evidence of ischemia with normal EF.  Echo from 04/2018 showed normal LV systolic function with mild LVH as well as a bioprosthetic aortic valve with normal function and no evidence of pulmonary hypertension.  She had atrial flutter with RVR in 08/2018 and underwent successful DCCV subsequently.  Following this, she had recurrent atrial flutter and was referred to EP.  Given her comorbid conditions including COPD requiring home oxygen, atrial flutter ablation was not advised.  Due to QRS widening, the dose of flecainide was decreased back down to 50 mg twice daily.  She subsequently underwent repeat DCCV in 05/2019.  She sought a second opinion with Dr. [name] at [facility] who did not advise a different antiarrhythmic medication at that time.  Again, ablation was felt to be too high risk given her underlying pulmonary disease.  Notes indicate there has been some discussion regarding possible AV nodal ablation with permanent pacemaker implantation.  She was most recently seen in the office on 08/08/2019  after redeveloping A. fib the day prior with reported palpitations and increased dyspnea.  In the office she was noted to be in coarse A. fib with RVR with ventricular rates in the 1 teens bpm with LVH and QRS widening with a duration of 126 ms.  She underwent successful DCCV with cardioversion performed at 100 J with the patient having a 3 to 5-second postconversion pause.  Shortly after returning home after her cardioversion she was concerned with her heart rates being in the 80s to 90s bpm.  She did not feel like she was back in A. Fib.  ***   Labs independently reviewed: 08/2019 - potassium 3.4, BUN 14, serum creatinine 0.75, Hgb 10.6, PLT 308 07/2019  - TSH normal, A1c 5.7, TC 127, TG 94, HDL 53, LDL 56, albumin 2.3, AST/ALT normal  Past Medical History:  Diagnosis Date  . 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 s/p bioprosthetic valve replacement 2008 at [facility];  b. 01/2015 Echo: EF 60-65%, no rwma, Gr1 DD, mildly dil LA, nl RV fxn.  . Atrial fibrillation with RVR ([place]) 01/11/2015  . Atrial flutter ([place])    a. 08/2016 s/p DCCV.  Remains on flecainide 50 mg bid.  . Basal skull fracture ([place]) 20 yrs ago  . Chronic respiratory failure ([place])   . COPD (chronic obstructive pulmonary disease) ([place])    a. on home O2 at 2L since 2008  . 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 Essential hypertension 01/24/2015  . History of  cardiac cath    a. 2008 prior to Aortic aneurysm repair-->nl cors.  . History of stress test    a. 10/2015 MV: no ischemia/infarct.  [name] HLD (hyperlipidemia)   . HTN (hypertension)   . Hypothyroidism   . Obesity   . PAF (paroxysmal atrial fibrillation) ([name])    a. on Eliquis; b. CHADS2VASc = 3 (HTN, age x 1, female).  . Paroxysmal atrial fibrillation ([place]) 01/24/2015  . Right upper quadrant abdominal tenderness without rebound tenderness 03/02/2018  . S/P ascending aortic aneurysm repair 2008    Past Surgical History:  Procedure Laterality  Date  . ABDOMINAL AORTIC ANEURYSM REPAIR  2008  . ABDOMINAL HYSTERECTOMY    . AORTIC VALVE REPLACEMENT  2008  . CARDIAC CATHETERIZATION     [facility]  . CARDIOVERSION N/A 08/15/2018   Procedure: CARDIOVERSION;  Surgeon: [name], MD;  Location: [facility] ORS;  Service: Cardiovascular;  Laterality: N/A;  . CARDIOVERSION N/A 11/14/2018   Procedure: CARDIOVERSION (CATH LAB);  Surgeon: [name], MD;  Location: [facility] ORS;  Service: Cardiovascular;  Laterality: N/A;  . CARDIOVERSION N/A 06/05/2019   Procedure: CARDIOVERSION;  Surgeon: [name], MD;  Location: [facility] ORS;  Service: Cardiovascular;  Laterality: N/A;  . CARDIOVERSION N/A 08/14/2019   Procedure: CARDIOVERSION;  Surgeon: [name], MD;  Location: [facility] ORS;  Service: Cardiovascular;  Laterality: N/A;  . CARPAL TUNNEL RELEASE    . ELECTROPHYSIOLOGIC STUDY N/A 08/17/2016   Procedure: Cardioversion;  Surgeon: [name], MD;  Location: [facility] ORS;  Service: Cardiovascular;  Laterality: N/A;  . TUMOR EXCISION Left    x3 (arm)    Current Medications: No outpatient medications have been marked as taking for the 09/25/19 encounter (Appointment) with Rise Mu, PA-C.    Allergies:   Benadryl [diphenhydramine hcl (sleep)], Cetirizine, Diphenhydramine-zinc acetate, Lasix [furosemide], Levaquin [levofloxacin in d5w], Levofloxacin, Meloxicam, Soy allergy, and Sulfa antibiotics   Social History   Socioeconomic History  . Marital status: Single    Spouse name: Not on file  . Number of children: Not on file  . Years of education: Not on file  . Highest education level: Not on file  Occupational History  . Not on file  Tobacco Use  . Smoking status: Former Smoker    Types: Cigarettes  . Smokeless tobacco: Never Used  Substance and Sexual Activity  . Alcohol use: No  . Drug use: No  . Sexual activity: Never    Birth control/protection: Surgical  Other Topics Concern  . Not on file  Social History Narrative    . Not on file   Social Determinants of Health   Financial Resource Strain:   . Difficulty of Paying Living Expenses: Not on file  Food Insecurity:   . Worried About Charity fundraiser in the Last Year: Not on file  . Ran Out of Food in the Last Year: Not on file  Transportation Needs:   . Lack of Transportation (Medical): Not on file  . Lack of Transportation (Non-Medical): Not on file  Physical Activity:   . Days of Exercise per Week: Not on file  . Minutes of Exercise per Session: Not on file  Stress:   . Feeling of Stress : Not on file  Social Connections:   . Frequency of Communication with Friends and Family: Not on file  . Frequency of Social Gatherings with Friends and Family: Not on file  . Attends Religious Services: Not on file  . Active Member  of Clubs or Organizations: Not on file  . Attends Archivist Meetings: Not on file  . Marital Status: Not on file     Family History:  The patient's family history includes Stroke in her father and paternal grandmother.  ROS:   ROS   EKGs/Labs/Other Studies Reviewed:    Studies reviewed were summarized above. The additional studies were reviewed today:  2D echo 08/2017: - Left ventricle: The cavity size was normal. There was moderate   concentric hypertrophy. Systolic function was normal. The   estimated ejection fraction was in the range of 60% to 65%. Wall   motion was normal; there were no regional wall motion   abnormalities. Doppler parameters are consistent with abnormal   left ventricular relaxation (grade 1 diastolic dysfunction). - Mitral valve: There was mild regurgitation. - Left atrium: The atrium was mildly dilated. - Right ventricle: Systolic function was normal. - Pulmonary arteries: Systolic pressure was within the normal   range.  Impressions:  - Rhythm is normal sinus. __________  Myoview 08/2017:  The study is normal.  This is a low risk study.  The left ventricular  ejection fraction is normal (55-65%). __________  Outpatient cardiac monitoring 01/2017: Normal sinus rhythm. 3 brief episodes of  narrow complex irregular tachycardia suggestive of atrial fibrillation. Average heart rate was 69 bpm. Minimum heart rate was 37 bpm which happened at 5: 54 AM. No significant bradycardia during daytime.   EKG:  EKG is ordered today.  The EKG ordered today demonstrates ***  Recent Labs: 10/31/2018: Magnesium 2.0 07/17/2019: ALT 13; TSH 3.220 08/10/2019: BUN 14; Creatinine, Ser 0.75; Hemoglobin 10.6; Platelets 308; Potassium 3.4; Sodium 133  Recent Lipid Panel    Component Value Date/Time   CHOL 127 07/17/2019 0928   TRIG 94 07/17/2019 0928   HDL 53 07/17/2019 0928   LDLCALC 56 07/17/2019 0928    PHYSICAL EXAM:    VS:  There were no vitals taken for this visit.  BMI: There is no height or weight on file to calculate BMI.  Physical Exam  Wt Readings from Last 3 Encounters:  09/19/19 186 lb (84.4 kg)  08/14/19 184 lb (83.5 kg)  08/08/19 184 lb 4 oz (83.6 kg)     ASSESSMENT & PLAN:   1. ***  Disposition: F/u with Dr. Fletcher Anon or an APP in ***.    Medication Adjustments/Labs and Tests Ordered: Current medicines are reviewed at length with the patient today.  Concerns regarding medicines are outlined above. Medication changes, Labs and Tests ordered today are summarized above and listed in the Patient Instructions accessible in Encounters.   Signed, Christell Faith, PA-C 09/20/2019 12:55 PM     [address], [place] [address] [phone]

## 2019-09-20 NOTE — Telephone Encounter (Signed)
Please review dosage and amount taken - patient takes differently than instructed - please call patient if any changes needed  *STAT* If patient is at the pharmacy, call can be transferred to refill team.   1. Which medications need to be refilled? (please list name of each medication and dose if known) Patient states she takes potassium chloride 20 MEQ - two in the morning (40 MG) and 2 at night (40 MG)   2. Which pharmacy/location (including street and city if local pharmacy) is medication to be sent to? [facility]  3. Do they need a 30 day or 90 day supply? 90 day

## 2019-09-21 ENCOUNTER — Other Ambulatory Visit: Payer: Self-pay

## 2019-09-21 DIAGNOSIS — F5101 Primary insomnia: Secondary | ICD-10-CM

## 2019-09-21 MED ORDER — TRAZODONE HCL 50 MG PO TABS: 50.0000 mg | ORAL_TABLET | Freq: Every day | ORAL | 1 refills | Status: DC

## 2019-09-25 ENCOUNTER — Ambulatory Visit: Payer: Medicare Other | Admitting: Physician Assistant

## 2019-09-27 ENCOUNTER — Other Ambulatory Visit: Payer: Self-pay

## 2019-09-27 ENCOUNTER — Other Ambulatory Visit: Payer: Self-pay | Admitting: Cardiovascular Disease

## 2019-09-27 ENCOUNTER — Other Ambulatory Visit: Payer: Self-pay | Admitting: Internal Medicine

## 2019-09-27 MED ORDER — DULOXETINE HCL 60 MG PO CPEP: 60.0000 mg | ORAL_CAPSULE | Freq: Every day | ORAL | 1 refills | Status: DC

## 2019-09-27 MED ORDER — HYDRALAZINE HCL 25 MG PO TABS: 25.0000 mg | ORAL_TABLET | Freq: Three times a day (TID) | ORAL | 3 refills | Status: DC

## 2019-10-03 DIAGNOSIS — J449 Chronic obstructive pulmonary disease, unspecified: Secondary | ICD-10-CM | POA: Diagnosis not present

## 2019-10-06 ENCOUNTER — Telehealth: Payer: Self-pay

## 2019-10-06 NOTE — Telephone Encounter (Signed)
lmom for patient to confirm 10-10-19 ov as virtual.

## 2019-10-10 ENCOUNTER — Ambulatory Visit: Payer: Medicare Other | Admitting: Nurse Practitioner

## 2019-10-10 ENCOUNTER — Encounter: Payer: Self-pay | Admitting: Nurse Practitioner

## 2019-10-10 VITALS — HR 76 | Wt 184.0 lb

## 2019-10-10 DIAGNOSIS — F411 Generalized anxiety disorder: Secondary | ICD-10-CM | POA: Diagnosis not present

## 2019-10-10 DIAGNOSIS — F321 Major depressive disorder, single episode, moderate: Secondary | ICD-10-CM

## 2019-10-10 DIAGNOSIS — I10 Essential (primary) hypertension: Secondary | ICD-10-CM | POA: Diagnosis not present

## 2019-10-10 DIAGNOSIS — I4819 Other persistent atrial fibrillation: Secondary | ICD-10-CM

## 2019-10-10 MED ORDER — DULOXETINE HCL 60 MG PO CPEP: 60.0000 mg | ORAL_CAPSULE | Freq: Two times a day (BID) | ORAL | 1 refills | Status: DC

## 2019-10-10 MED ORDER — ALPRAZOLAM 0.25 MG PO TABS: 0.2500 mg | ORAL_TABLET | Freq: Two times a day (BID) | ORAL | 2 refills | Status: DC | PRN

## 2019-10-10 NOTE — Progress Notes (Signed)
[facility] [place], [place] [address]  Internal MEDICINE  Telephone Visit  Patient Name: [name]  [id number]  [id number]  Date of Service: 10/10/2019  I connected with the patient at 4:18pm by telephone and verified the patients identity using two identifiers.   I discussed the limitations, risks, security and privacy concerns of performing an evaluation and management service by telephone and the availability of in person appointments. I also discussed with the patient that there may be a patient responsible charge related to the service.  The patient expressed understanding and agrees to proceed.    Chief Complaint  Patient presents with  . Telephone Assessment  . Telephone Screen  . Hypertension  . Hypothyroidism  . Hyperlipidemia  . Anxiety    The patient has been contacted via telephone for follow up visit due to concerns for spread of novel coronavirus. The patient presents for follow up visit. States that she is going through a period of time where anytime she feels like things aren't going her way, she gets angry, shouts at people around her, has developed road rage. States that her depression is not well controlled. She denies feeling suicidal. States that she wants to live as long as she can, but states that "life just currently sucks." States that her granddaughter, who is 64 years old is often with her and helps her a great deal. She is currently taking duloxetine 60mg . Offered referral to psychiatry and/or counseling services. She states that she has seen mental health providers in the past and this has never helped. She does not wish to have a new mental health provider at all. Discussed increasing the dose of duloxetine to twice daily. She states that she has started taking the second, as needed, dose of her alprazolam. She states that this does help her to calm down some when anger gets very bad. The patient abruptly ended the phone call when she  got another phone call about her car.       Current Medication: Outpatient Encounter Medications as of 10/10/2019  Medication Sig  . acetaminophen (TYLENOL) 500 MG tablet Take 500 mg by mouth every 4 (four) hours as needed for moderate pain or headache.   . albuterol (PROVENTIL HFA;VENTOLIN HFA) 108 (90 Base) MCG/ACT inhaler Inhale 2 puffs into the lungs every 6 (six) hours as needed for wheezing or shortness of breath.  [name] apixaban (ELIQUIS) 5 MG TABS tablet Take 1 tablet (5 mg total) by mouth 2 (two) times daily.  . Artificial Tear Solution (GENTEAL TEARS) 0.1-0.2-0.3 % SOLN Place 1 drop into both eyes daily.  [name] atorvastatin (LIPITOR) 40 MG tablet 1 tab po qhs (Patient taking differently: Take 40 mg by mouth at bedtime. 1 tab po qhs)  . Coenzyme Q10 (COQ10) 200 MG CAPS Take 200 mg by mouth daily.  . diclofenac sodium (VOLTAREN) 1 % GEL To the affected joint (Patient taking differently: Apply 2-4 g topically 3 (three) times daily as needed (shoulder pain.). To the affected joint )  . diltiazem (CARDIZEM CD) 120 MG 24 hr capsule Take 1 capsule (120 mg total) by mouth daily.  . ferrous sulfate (FERROUSUL) 325 (65 FE) MG tablet Take 650 mg by mouth daily with breakfast.   . flecainide (TAMBOCOR) 50 MG tablet Take 1 tablet by mouth twice daily.  . Fluticasone-Umeclidin-Vilant (TRELEGY ELLIPTA) 100-62.5-25 MCG/INH AEPB Inhale 1 puff into the lungs daily.  . hydrALAZINE (APRESOLINE) 25 MG tablet Take 1 tablet (25 mg total)  by mouth 3 (three) times daily.  [name] levothyroxine (SYNTHROID) 25 MCG tablet Take 1 tablet (25 mcg total) by mouth daily before breakfast.  . lisinopril-hydrochlorothiazide (ZESTORETIC) 20-25 MG tablet Take 1 tablet by mouth daily.  [name] loratadine (CLARITIN) 10 MG tablet Take 10 mg by mouth daily as needed for allergies.   . OXYGEN Inhale into the lungs.  . pantoprazole (PROTONIX) 40 MG tablet Take 1 tablet (40 mg total) by mouth daily.  . potassium chloride (KLOR-CON) 10 MEQ tablet  Take 2 tablets (20 mEq total) by mouth 2 (two) times daily.  . sodium chloride (OCEAN) 0.65 % SOLN nasal spray Place 1 spray into both nostrils daily as needed for congestion.  . Tiotropium Bromide-Olodaterol (STIOLTO RESPIMAT) 2.5-2.5 MCG/ACT AERS Inhale 1 puff into the lungs daily.  . traZODone (DESYREL) 50 MG tablet Take 1 tablet (50 mg total) by mouth at bedtime.  . [DISCONTINUED] ALPRAZolam (XANAX) 0.25 MG tablet Take 2 tablets (0.5 mg total) by mouth 2 (two) times daily as needed for anxiety.  . [DISCONTINUED] DULoxetine (CYMBALTA) 60 MG capsule Take 1 capsule (60 mg total) by mouth daily.  [name] ALPRAZolam (XANAX) 0.25 MG tablet Take 1 tablet (0.25 mg total) by mouth 2 (two) times daily as needed for anxiety.  . DULoxetine (CYMBALTA) 60 MG capsule Take 1 capsule (60 mg total) by mouth 2 (two) times daily.   No facility-administered encounter medications on file as of 10/10/2019.    Surgical History: Past Surgical History:  Procedure Laterality Date  . ABDOMINAL AORTIC ANEURYSM REPAIR  2008  . ABDOMINAL HYSTERECTOMY    . AORTIC VALVE REPLACEMENT  2008  . CARDIAC CATHETERIZATION     [facility]  . CARDIOVERSION N/A 08/15/2018   Procedure: CARDIOVERSION;  Surgeon: [name], MD;  Location: [facility] ORS;  Service: Cardiovascular;  Laterality: N/A;  . CARDIOVERSION N/A 11/14/2018   Procedure: CARDIOVERSION (CATH LAB);  Surgeon: [name], MD;  Location: [facility] ORS;  Service: Cardiovascular;  Laterality: N/A;  . CARDIOVERSION N/A 06/05/2019   Procedure: CARDIOVERSION;  Surgeon: [name], MD;  Location: [facility] ORS;  Service: Cardiovascular;  Laterality: N/A;  . CARDIOVERSION N/A 08/14/2019   Procedure: CARDIOVERSION;  Surgeon: [name], MD;  Location: [facility] ORS;  Service: Cardiovascular;  Laterality: N/A;  . CARPAL TUNNEL RELEASE    . ELECTROPHYSIOLOGIC STUDY N/A 08/17/2016   Procedure: Cardioversion;  Surgeon: [name], MD;  Location: [facility] ORS;  Service: Cardiovascular;   Laterality: N/A;  . TUMOR EXCISION Left    x3 (arm)    Medical History: Past Medical History:  Diagnosis Date  . 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 s/p bioprosthetic valve replacement 2008 at [facility];  b. 01/2015 Echo: EF 60-65%, no rwma, Gr1 DD, mildly dil LA, nl RV fxn.  . Atrial fibrillation with RVR ([place]) 01/11/2015  . Atrial flutter ([place])    a. 08/2016 s/p DCCV.  Remains on flecainide 50 mg bid.  . Basal skull fracture ([place]) 20 yrs ago  . Chronic respiratory failure ([place])   . COPD (chronic obstructive pulmonary disease) ([place])    a. on home O2 at 2L since 2008  . 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 Essential hypertension 01/24/2015  . History of cardiac cath    a. 2008 prior to Aortic aneurysm repair-->nl cors.  . History of stress test    a. 10/2015 MV: no ischemia/infarct.  [name] HLD (hyperlipidemia)   .  HTN (hypertension)   . Hypothyroidism   . Obesity   . PAF (paroxysmal atrial fibrillation) ([name])    a. on Eliquis; b. CHADS2VASc = 3 (HTN, age x 1, female).  . Paroxysmal atrial fibrillation ([place]) 01/24/2015  . Right upper quadrant abdominal tenderness without rebound tenderness 03/02/2018  . S/P ascending aortic aneurysm repair 2008    Family History: Family History  Problem Relation Age of Onset  . Stroke Father   . Stroke Paternal Grandmother     Social History   Socioeconomic History  . Marital status: Single    Spouse name: Not on file  . Number of children: Not on file  . Years of education: Not on file  . Highest education level: Not on file  Occupational History  . Not on file  Tobacco Use  . Smoking status: Former Smoker    Types: Cigarettes  . Smokeless tobacco: Never Used  Substance and Sexual Activity  . Alcohol use: No  . Drug use: No  . Sexual activity: Never    Birth control/protection: Surgical  Other Topics Concern  . Not on file  Social History Narrative  . Not on file   Social Determinants of Health    Financial Resource Strain:   . Difficulty of Paying Living Expenses: Not on file  Food Insecurity:   . Worried About Charity fundraiser in the Last Year: Not on file  . Ran Out of Food in the Last Year: Not on file  Transportation Needs:   . Lack of Transportation (Medical): Not on file  . Lack of Transportation (Non-Medical): Not on file  Physical Activity:   . Days of Exercise per Week: Not on file  . Minutes of Exercise per Session: Not on file  Stress:   . Feeling of Stress : Not on file  Social Connections:   . Frequency of Communication with Friends and Family: Not on file  . Frequency of Social Gatherings with Friends and Family: Not on file  . Attends Religious Services: Not on file  . Active Member of Clubs or Organizations: Not on file  . Attends Archivist Meetings: Not on file  . Marital Status: Not on file  Intimate Partner Violence:   . Fear of Current or Ex-Partner: Not on file  . Emotionally Abused: Not on file  . Physically Abused: Not on file  . Sexually Abused: Not on file      Review of Systems  Constitutional: Positive for fatigue. Negative for chills and unexpected weight change.  HENT: Negative for congestion, postnasal drip, rhinorrhea, sneezing and sore throat.   Respiratory: Negative for cough, chest tightness, shortness of breath and wheezing.   Cardiovascular: Negative for chest pain and palpitations.  Gastrointestinal: Negative for abdominal pain, constipation, diarrhea, nausea and vomiting.  Endocrine: Negative for cold intolerance, heat intolerance, polydipsia and polyuria.  Musculoskeletal: Negative for arthralgias, back pain, joint swelling and neck pain.  Skin: Negative for rash.  Allergic/Immunologic: Negative for environmental allergies.  Neurological: Negative for dizziness, tremors, numbness and headaches.  Hematological: Negative for adenopathy. Does not bruise/bleed easily.  Psychiatric/Behavioral: Positive for dysphoric  mood and sleep disturbance. Negative for behavioral problems (Depression) and suicidal ideas. The patient is nervous/anxious.    Today's Vitals   10/10/19 1541  Pulse: 76  SpO2: 97%  Weight: 184 lb (83.5 kg)   Body mass index is 38.46 kg/m.  Observation/Objective:   The patient is alert and oriented. She is pleasant and answers all questions  appropriately. Breathing is non-labored. She is in no acute distress at this time. The patient sounds depressed and fatigued on the phone visit.    Assessment/Plan: 1. Essential hypertension Continue BP medication as prescribed   2. Persistent atrial fibrillation ([name]) Regular visits with cardiology as scheduled.   3. Episode of moderate major depression ([name]) Trial of duloxetine 60mg  to twice daily. Reassess at next visit.  - DULoxetine (CYMBALTA) 60 MG capsule; Take 1 capsule (60 mg total) by mouth 2 (two) times daily.  Dispense: 180 capsule; Refill: 1  4. GAD (generalized anxiety disorder) May take alprazolam 0.25mg  up to twice daily as needed for acute anxiety - ALPRAZolam (XANAX) 0.25 MG tablet; Take 1 tablet (0.25 mg total) by mouth 2 (two) times daily as needed for anxiety.  Dispense: 60 tablet; Refill: 2    General Counseling: Raissa verbalizes understanding of the findings of today's phone visit and agrees with plan of treatment. I have discussed any further diagnostic evaluation that may be needed or ordered today. We also reviewed her medications today. she has been encouraged to call the office with any questions or concerns that should arise related to todays visit.  This patient was seen by [place] with Dr [name] as a part of collaborative care agreement  Meds ordered this encounter  Medications  . ALPRAZolam (XANAX) 0.25 MG tablet    Sig: Take 1 tablet (0.25 mg total) by mouth 2 (two) times daily as needed for anxiety.    Dispense:  60 tablet    Refill:  2    Order Specific Question:    Supervising Provider    Answer:   [name] [[id number]]  . DULoxetine (CYMBALTA) 60 MG capsule    Sig: Take 1 capsule (60 mg total) by mouth 2 (two) times daily.    Dispense:  180 capsule    Refill:  1    Please note increased dose    Order Specific Question:   Supervising Provider    Answer:   [name] [[id number]]    Time spent: 40 Minutes    Dr [name] Internal medicine

## 2019-10-11 ENCOUNTER — Ambulatory Visit: Payer: Medicare Other

## 2019-10-13 DIAGNOSIS — H1013 Acute atopic conjunctivitis, bilateral: Secondary | ICD-10-CM | POA: Diagnosis not present

## 2019-10-17 DIAGNOSIS — H524 Presbyopia: Secondary | ICD-10-CM | POA: Diagnosis not present

## 2019-10-23 ENCOUNTER — Other Ambulatory Visit: Payer: Self-pay

## 2019-10-23 NOTE — Telephone Encounter (Signed)
Error

## 2019-10-26 ENCOUNTER — Telehealth: Payer: Self-pay | Admitting: Cardiovascular Disease

## 2019-10-26 ENCOUNTER — Other Ambulatory Visit: Payer: Self-pay | Admitting: Cardiovascular Disease

## 2019-10-26 ENCOUNTER — Other Ambulatory Visit: Payer: Self-pay | Admitting: Adult Health

## 2019-10-26 DIAGNOSIS — I4892 Unspecified atrial flutter: Secondary | ICD-10-CM

## 2019-10-26 NOTE — Telephone Encounter (Signed)
Please review for refill, Thanks !  

## 2019-10-26 NOTE — Telephone Encounter (Signed)
Patient states she is in afib. Please call to discuss States her BP is 146/96 HR 106. States she can feel it beating in her head and chest.

## 2019-10-26 NOTE — Telephone Encounter (Signed)
Spoke with the patient. Patient sts that she woke up this morning in AFIB. She is taking all her medications as prescribed. She has not missed any doses of Eliquis. She took her Diltiazem 120 mg this morning around 9am. Patient reports BP 146/96 HR 106bpm.  Advised the patient that she could take an Additional 120 mg of Diltiazem Pt refused.  Advised the patient that she could go to [facility] ED, to be dccv in the ED, pt refused. She also refuses to go to [facility] ED, patient sts that they will not do anything for her. She thinks she needs another dccv and is frustrated that she will need to wait 1 week +. Advised the patient that she has refused all of my recommendations. Advised the patient that I will fwd the update to Dr. Fletcher Anon and call back with his recommendation.  Patient agreeable with the plan.

## 2019-10-27 NOTE — Telephone Encounter (Signed)
Patient wants to stay in [place] if possible.  Is this ok ?

## 2019-10-27 NOTE — Telephone Encounter (Signed)
Called to give the patient Dr. Tyrell Antonio response and recommendation. [email or web address].

## 2019-10-27 NOTE — Telephone Encounter (Signed)
Spoke with the patient advised her that the EP cardiologist are in [place] and she will be contacted by their scheduler to schedule the consult.  Patient verbalized understanding.

## 2019-10-27 NOTE — Telephone Encounter (Signed)
Patient returning phone call  Please return call when able

## 2019-10-27 NOTE — Telephone Encounter (Addendum)
Spoke with the patient. Patient made aware of Dr. Fletcher Anon' response and recommendation. Patient sts that she contacted [facility] and was told that they would not be able to help her. Patient would like to have a second opinion with our EP Cardiologist. Advised the patient that I will place an urgent referral to Dr. Rayann Heman or Dr. Curt Bears at our [facility] office. Patient is agreeable and voiced appreciation for the assistance. Advised the patient that their scheduler will contact her to schedule.  Ref placed. Msg sent to the EP scheduler at [facility].

## 2019-10-27 NOTE — Telephone Encounter (Signed)
It seems that she keeps going into atrial flutter.  She has been evaluated by Dr. [name] before and felt that she was not a good candidate for ablation given lung disease on home oxygen.  She was also evaluated for the same thing at [facility] and they had the same feeling although they did consider AV nodal ablation and permanent pacemaker placement. If she wants to stay within the [facility] system, I recommend referral to Dr. Rayann Heman or Dr. Curt Bears for another opinion. I personally feel that her lung disease has been stable and her oxygen requirement has not changed in years.  Risk of atrial flutter ablation might be less than risk of repeated cardioversions especially that she had a very long pause during recent cardioversion. I copied Dr. Rayann Heman and Dr. Curt Bears on this message to get their insights.  Thanks

## 2019-10-31 NOTE — Telephone Encounter (Signed)
Patient is scheduled for an EP consult with Dr. Rayann Heman on 11/06/19.

## 2019-11-01 ENCOUNTER — Telehealth: Payer: Self-pay

## 2019-11-01 NOTE — Telephone Encounter (Signed)
Pt called stating that she wanted to know what percentage her lungs are functioning at because her cardio wants to find alternatives to helping her a fib aside from shocking her heart. Spoke with Quita Skye and Quita Skye spoke to Dr. Devona Konig and we notified patient that her lungs are functioning at 69%. Pt stated that she was told to see another doctor by her cardio and she is not happy with that. While she was speaking with Nimisha after Nimisha was trying to make sure her questions were answered pt hung up the phone on Nimisha. We tried calling her back to possibly make an appt with Dr. Lanice Schwab tomorrow 10/13/19, but patient did not answer phone call.

## 2019-11-01 NOTE — Telephone Encounter (Signed)
Patient calling in about "why Dr. Fletcher Anon won't shock my heart". Patient wants to speak with the nurse about the reasoning and if it related to medication. Patient was not too clear but she did say she would like to speak with the nurse

## 2019-11-02 NOTE — Telephone Encounter (Signed)
She really should be seen by EP and not interventional or general cardiology given her complex arrhythmia history.  Dr. Saralyn Pilar is not EP.  She should check with Dr. [name] at [facility]

## 2019-11-02 NOTE — Telephone Encounter (Signed)
I have spoken with her 3-4 times and explained the caution for repeating dccv due to the prolonged pause during her last procedure. She has cancelled the urgent appt that was scheduled with EP Dr. Rayann Heman on 11/06/19.   Will route to Dr. Fletcher Anon to advise  further

## 2019-11-02 NOTE — Telephone Encounter (Addendum)
Called the patient. lmtcb. Discussed with Dr. Fletcher Anon. He will proceed with another dccv if that is what the patient wants. The last cardioversion did not hold long. There was a pause that lasted a few seconds during the last dccv, there are some risk associated with the procedure. She has been cardioverted 6-7 times and he was not sure if she wanted to go through another one.  Dr. Fletcher Anon has been updated by [facility] EP and was told that the patient has been scheduled for a consult to discuss proceeding with a possible ablation/ PPM implantation.  If the patient wishes to proceed with the dccv she will need to be seen by an APP for an updated EKG and H& P.

## 2019-11-02 NOTE — Telephone Encounter (Addendum)
Spoke with the patient. Advised her of the discussion I had with Dr. Fletcher Anon.  Patient sts the the consult she has is scheduled with Dr. Saralyn Pilar with [facility]. Patient would like to hold off on the dccv for now. She will see Dr. Saralyn Pilar as scheduled. She does not have a future appt scheduled with our office. She will contact our office to schdule an appt if she decides to proceed with the dccv or needs Dr. Tyrell Antonio input.

## 2019-11-03 DIAGNOSIS — J449 Chronic obstructive pulmonary disease, unspecified: Secondary | ICD-10-CM | POA: Diagnosis not present

## 2019-11-03 NOTE — Telephone Encounter (Signed)
Patient returning Lisa's phone call. Patient wants to pursue having a cardioversion   Please advise

## 2019-11-03 NOTE — Telephone Encounter (Signed)
Call to patient. She is agreeable to come in for next available appt with Dr. Fletcher Anon on tues 3/9 to discuss possible DCCV.   Advised pt to call for any further questions or concerns.

## 2019-11-06 ENCOUNTER — Institutional Professional Consult (permissible substitution): Payer: Medicare Other | Admitting: Internal Medicine

## 2019-11-06 DIAGNOSIS — I10 Essential (primary) hypertension: Secondary | ICD-10-CM | POA: Diagnosis not present

## 2019-11-06 DIAGNOSIS — I712 Thoracic aortic aneurysm, without rupture: Secondary | ICD-10-CM | POA: Diagnosis not present

## 2019-11-06 DIAGNOSIS — Q231 Congenital insufficiency of aortic valve: Secondary | ICD-10-CM | POA: Diagnosis not present

## 2019-11-06 DIAGNOSIS — I480 Paroxysmal atrial fibrillation: Secondary | ICD-10-CM | POA: Diagnosis not present

## 2019-11-07 ENCOUNTER — Other Ambulatory Visit
Admission: RE | Admit: 2019-11-07 | Discharge: 2019-11-07 | Disposition: A | Discharge status: Home / Self Care | Payer: Medicare Other | Source: Ambulatory Visit | Attending: Cardiology | Admitting: Cardiology

## 2019-11-07 DIAGNOSIS — Z01812 Encounter for preprocedural laboratory examination: Secondary | ICD-10-CM | POA: Diagnosis not present

## 2019-11-07 DIAGNOSIS — Z20822 Contact with and (suspected) exposure to covid-19: Secondary | ICD-10-CM | POA: Insufficient documentation

## 2019-11-07 LAB — SARS CORONAVIRUS 2 (TAT 6-24 HRS): SARS Coronavirus 2: NEGATIVE

## 2019-11-09 ENCOUNTER — Encounter
Admission: RE | Disposition: A | Discharge status: Home / Self Care | Payer: Self-pay | Source: Home / Self Care | Attending: Cardiology

## 2019-11-09 ENCOUNTER — Institutional Professional Consult (permissible substitution): Payer: Medicare Other | Admitting: Cardiology

## 2019-11-09 ENCOUNTER — Other Ambulatory Visit: Payer: Self-pay

## 2019-11-09 ENCOUNTER — Ambulatory Visit: Payer: Medicare Other | Admitting: Anesthesiology

## 2019-11-09 ENCOUNTER — Encounter: Payer: Self-pay | Admitting: Cardiology

## 2019-11-09 ENCOUNTER — Ambulatory Visit
Admission: RE | Admit: 2019-11-09 | Discharge: 2019-11-09 | Disposition: A | Discharge status: Home / Self Care | Payer: Medicare Other | Attending: Cardiology | Admitting: Cardiology

## 2019-11-09 DIAGNOSIS — J449 Chronic obstructive pulmonary disease, unspecified: Secondary | ICD-10-CM | POA: Insufficient documentation

## 2019-11-09 DIAGNOSIS — F419 Anxiety disorder, unspecified: Secondary | ICD-10-CM | POA: Insufficient documentation

## 2019-11-09 DIAGNOSIS — I480 Paroxysmal atrial fibrillation: Secondary | ICD-10-CM | POA: Diagnosis not present

## 2019-11-09 DIAGNOSIS — Z7901 Long term (current) use of anticoagulants: Secondary | ICD-10-CM | POA: Diagnosis not present

## 2019-11-09 DIAGNOSIS — Z953 Presence of xenogenic heart valve: Secondary | ICD-10-CM | POA: Insufficient documentation

## 2019-11-09 DIAGNOSIS — I10 Essential (primary) hypertension: Secondary | ICD-10-CM | POA: Insufficient documentation

## 2019-11-09 DIAGNOSIS — E669 Obesity, unspecified: Secondary | ICD-10-CM | POA: Diagnosis not present

## 2019-11-09 DIAGNOSIS — H9193 Unspecified hearing loss, bilateral: Secondary | ICD-10-CM | POA: Diagnosis not present

## 2019-11-09 DIAGNOSIS — E785 Hyperlipidemia, unspecified: Secondary | ICD-10-CM | POA: Diagnosis not present

## 2019-11-09 DIAGNOSIS — E039 Hypothyroidism, unspecified: Secondary | ICD-10-CM | POA: Diagnosis not present

## 2019-11-09 DIAGNOSIS — I712 Thoracic aortic aneurysm, without rupture: Secondary | ICD-10-CM | POA: Diagnosis not present

## 2019-11-09 DIAGNOSIS — Z6838 Body mass index (BMI) 38.0-38.9, adult: Secondary | ICD-10-CM | POA: Insufficient documentation

## 2019-11-09 DIAGNOSIS — G4730 Sleep apnea, unspecified: Secondary | ICD-10-CM | POA: Diagnosis not present

## 2019-11-09 DIAGNOSIS — J9610 Chronic respiratory failure, unspecified whether with hypoxia or hypercapnia: Secondary | ICD-10-CM | POA: Insufficient documentation

## 2019-11-09 DIAGNOSIS — Z79890 Hormone replacement therapy (postmenopausal): Secondary | ICD-10-CM | POA: Diagnosis not present

## 2019-11-09 DIAGNOSIS — F329 Major depressive disorder, single episode, unspecified: Secondary | ICD-10-CM | POA: Insufficient documentation

## 2019-11-09 DIAGNOSIS — Z8673 Personal history of transient ischemic attack (TIA), and cerebral infarction without residual deficits: Secondary | ICD-10-CM | POA: Insufficient documentation

## 2019-11-09 DIAGNOSIS — Z7951 Long term (current) use of inhaled steroids: Secondary | ICD-10-CM | POA: Diagnosis not present

## 2019-11-09 DIAGNOSIS — Z9981 Dependence on supplemental oxygen: Secondary | ICD-10-CM | POA: Insufficient documentation

## 2019-11-09 DIAGNOSIS — Z87891 Personal history of nicotine dependence: Secondary | ICD-10-CM | POA: Diagnosis not present

## 2019-11-09 DIAGNOSIS — Z79899 Other long term (current) drug therapy: Secondary | ICD-10-CM | POA: Diagnosis not present

## 2019-11-09 HISTORY — PX: CARDIOVERSION: SHX1299

## 2019-11-09 SURGERY — CARDIOVERSION
Anesthesia: General

## 2019-11-09 MED ORDER — PROPOFOL 10 MG/ML IV BOLUS
INTRAVENOUS | Status: AC
  Filled 2019-11-09: qty 20

## 2019-11-09 MED ORDER — SODIUM CHLORIDE 0.9 % IV SOLN: INTRAVENOUS | Status: DC | PRN

## 2019-11-09 MED ORDER — PROPOFOL 10 MG/ML IV BOLUS
INTRAVENOUS | Status: DC | PRN
  Administered 2019-11-09: 40 mg via INTRAVENOUS

## 2019-11-09 NOTE — Anesthesia Preprocedure Evaluation (Signed)
Anesthesia Evaluation  Patient identified by MRN, date of birth, ID band Patient awake    Reviewed: Allergy & Precautions, NPO status , Patient's Chart, lab work & pertinent test results  History of Anesthesia Complications Negative for: history of anesthetic complications  Airway Mallampati: III  TM Distance: >3 FB Neck ROM: Full    Dental  (+) Teeth Intact, Poor Dentition   Pulmonary shortness of breath and at rest, neg sleep apnea, COPD,  COPD inhaler and oxygen dependent, Patient abstained from smoking.Not current smoker, former smoker,    Pulmonary exam normal breath sounds clear to auscultation       Cardiovascular Exercise Tolerance: Poor METShypertension, Pt. on medications (-) CAD and (-) Past MI + dysrhythmias Atrial Fibrillation  Rhythm:Irregular Rate:Normal - Systolic murmurs    Neuro/Psych PSYCHIATRIC DISORDERS Anxiety Depression negative neurological ROS  negative psych ROS   GI/Hepatic neg GERD  ,(+)     (-) substance abuse  ,   Endo/Other  neg diabetesHypothyroidism   Renal/GU negative Renal ROS     Musculoskeletal   Abdominal   Peds  Hematology   Anesthesia Other Findings Past Medical History: No date: Aortic stenosis due to bicuspid aortic valve     Comment:  a. s/p bioprosthetic valve replacement 2008 at [facility];  b.              01/2015 Echo: EF 60-65%, no rwma, Gr1 DD, mildly dil LA,               nl RV fxn. 01/11/2015: Atrial fibrillation with RVR ([place]) No date: Atrial flutter ([place])     Comment:  a. 08/2016 s/p DCCV.  Remains on flecainide 50 mg bid. 20 yrs ago: Basal skull fracture ([name]) No date: Chronic respiratory failure ([name]) No date: COPD (chronic obstructive pulmonary disease) ([place])     Comment:  a. 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 2008 prior to Aortic aneurysm repair-->nl cors. No date: History of stress test     Comment:  a. 10/2015 MV: no ischemia/infarct. No date: HLD (hyperlipidemia) No date: HTN (hypertension) No date: Hypothyroidism No date: Obesity No date: PAF (paroxysmal atrial fibrillation) ([name])     Comment:  a. on Eliquis; b. CHADS2VASc = 3 (HTN, age x 17, female). 01/24/2015: Paroxysmal atrial fibrillation ([place]) 03/02/2018: Right upper quadrant abdominal tenderness without rebound  tenderness 2008: S/P ascending aortic aneurysm repair  Reproductive/Obstetrics                             Anesthesia Physical Anesthesia Plan  ASA: IV  Anesthesia Plan: General   Post-op Pain Management:    Induction: Intravenous  PONV Risk Score and Plan: 3 and TIVA and Propofol infusion  Airway Management Planned: Natural Airway and Nasal Cannula  Additional Equipment: None  Intra-op Plan:   Post-operative Plan: Extubation in OR  Informed Consent: I have reviewed the patients History and Physical, chart, labs and discussed the procedure including the risks, benefits and alternatives for the proposed anesthesia with the patient or authorized representative who has indicated his/her understanding and acceptance.     Dental advisory given  Plan Discussed with: CRNA and Surgeon  Anesthesia Plan Comments: (Discussed risks of anesthesia with patient, including PONV. Rare risks  discussed as well, such as cardiorespiratory and neurological sequelae, conversion to GETA. Patient understands. Patient counseled on being higher risk for anesthesia. Patient was told about increased risk of cardiac and respiratory events, including death. Patient understands. )        Anesthesia Quick Evaluation

## 2019-11-09 NOTE — Transfer of Care (Signed)
Immediate Anesthesia Transfer of Care Note  Patient: [name]  Procedure(s) Performed: CARDIOVERSION (N/A )  Patient Location: PACU  Anesthesia Type:General  Level of Consciousness: awake, alert  and oriented  Airway & Oxygen Therapy: Patient Spontanous Breathing and Patient connected to nasal cannula oxygen  Post-op Assessment: Report given to RN and Post -op Vital signs reviewed and stable  Post vital signs: Reviewed and stable  Last Vitals:  Vitals Value Taken Time  BP 154/79 11/09/19 0727  Temp 36.7 C 11/09/19 0715  Pulse 86 11/09/19 0728  Resp 23 11/09/19 0728  SpO2 95 % 11/09/19 0728    Last Pain:  Vitals:   11/09/19 0715  TempSrc: Oral  PainSc: 0-No pain         Complications: No apparent anesthesia complications

## 2019-11-09 NOTE — Anesthesia Procedure Notes (Signed)
Date/Time: 11/09/2019 7:10 AM Performed by: Nelda Marseille, CRNA Pre-anesthesia Checklist: Patient identified, Emergency Drugs available, Suction available, Patient being monitored and Timeout performed Oxygen Delivery Method: Nasal cannula

## 2019-11-09 NOTE — Addendum Note (Signed)
Addendum  created 11/09/19 0934 by Nelda Marseille, CRNA   Intraprocedure Event edited

## 2019-11-09 NOTE — Anesthesia Postprocedure Evaluation (Signed)
Anesthesia Post Note  Patient: [name]  Procedure(s) Performed: CARDIOVERSION (N/A )  Patient location during evaluation: PACU Anesthesia Type: General Level of consciousness: awake and alert Pain management: pain level controlled Vital Signs Assessment: post-procedure vital signs reviewed and stable Respiratory status: spontaneous breathing, nonlabored ventilation, respiratory function stable and patient connected to nasal cannula oxygen Cardiovascular status: blood pressure returned to baseline and stable Postop Assessment: no apparent nausea or vomiting Anesthetic complications: no     Last Vitals:  Vitals:   11/09/19 0728 11/09/19 0730  BP:  137/79  Pulse: 86 87  Resp: (!) 23 (!) 26  Temp:    SpO2: 95% 94%    Last Pain:  Vitals:   11/09/19 0715  TempSrc: Oral  PainSc: 0-No pain                 Arita Miss

## 2019-11-09 NOTE — Op Note (Signed)
[facility]   11/09/2019                     7:34 AM  PATIENT:  [name]    PRE-OPERATIVE DIAGNOSIS:  Cardioversion    PT ON OXYGEN    Afib  POST-OPERATIVE DIAGNOSIS:  Same  PROCEDURE:  CARDIOVERSION  SURGEON:  Isaias Cowman, MD    ANESTHESIA:     PREOPERATIVE INDICATIONS:  [name] is a  74 y.o. female with a diagnosis of Cardioversion    PT ON OXYGEN    Afib who failed conservative measures and elected for surgical management.    The risks benefits and alternatives were discussed with the patient preoperatively including but not limited to the risks of infection, bleeding, cardiopulmonary complications, the need for revision surgery, among others, and the patient was willing to proceed.   OPERATIVE PROCEDURE: The patient was brought to the special holding area in a fasting state.  She received 40 mg of propofol.  Underwent cardioversion at 15 J with successful conversion to sinus rhythm.  There were no periprocedural complications.

## 2019-11-14 ENCOUNTER — Ambulatory Visit: Payer: Medicare Other | Admitting: Cardiovascular Disease

## 2019-11-15 DIAGNOSIS — G4733 Obstructive sleep apnea (adult) (pediatric): Secondary | ICD-10-CM | POA: Diagnosis not present

## 2019-11-16 DIAGNOSIS — I4891 Unspecified atrial fibrillation: Secondary | ICD-10-CM | POA: Diagnosis not present

## 2019-11-16 DIAGNOSIS — I4821 Permanent atrial fibrillation: Secondary | ICD-10-CM | POA: Insufficient documentation

## 2019-11-16 DIAGNOSIS — I10 Essential (primary) hypertension: Secondary | ICD-10-CM | POA: Diagnosis not present

## 2019-11-17 ENCOUNTER — Telehealth: Payer: Self-pay | Admitting: Cardiovascular Disease

## 2019-11-17 NOTE — Telephone Encounter (Signed)
Patient being see at [facility]   Deleting recall

## 2019-11-28 ENCOUNTER — Other Ambulatory Visit: Payer: Self-pay | Admitting: Adult Health

## 2019-11-28 ENCOUNTER — Other Ambulatory Visit: Payer: Self-pay | Admitting: Cardiovascular Disease

## 2019-12-01 DIAGNOSIS — Z01818 Encounter for other preprocedural examination: Secondary | ICD-10-CM | POA: Diagnosis not present

## 2019-12-01 DIAGNOSIS — I10 Essential (primary) hypertension: Secondary | ICD-10-CM | POA: Diagnosis not present

## 2019-12-01 DIAGNOSIS — I480 Paroxysmal atrial fibrillation: Secondary | ICD-10-CM | POA: Diagnosis not present

## 2019-12-01 DIAGNOSIS — Z8673 Personal history of transient ischemic attack (TIA), and cerebral infarction without residual deficits: Secondary | ICD-10-CM | POA: Diagnosis not present

## 2019-12-01 DIAGNOSIS — Q231 Congenital insufficiency of aortic valve: Secondary | ICD-10-CM | POA: Diagnosis not present

## 2019-12-01 DIAGNOSIS — I712 Thoracic aortic aneurysm, without rupture: Secondary | ICD-10-CM | POA: Diagnosis not present

## 2019-12-01 DIAGNOSIS — I4891 Unspecified atrial fibrillation: Secondary | ICD-10-CM | POA: Diagnosis not present

## 2019-12-01 DIAGNOSIS — J439 Emphysema, unspecified: Secondary | ICD-10-CM | POA: Diagnosis not present

## 2019-12-01 DIAGNOSIS — J449 Chronic obstructive pulmonary disease, unspecified: Secondary | ICD-10-CM | POA: Diagnosis not present

## 2019-12-05 ENCOUNTER — Telehealth: Payer: Self-pay

## 2019-12-05 NOTE — Telephone Encounter (Signed)
COMPLETED MEDICAL RECORD REQUEST AND FAXED BACK REQUESTING RECORDS TO [facility].

## 2019-12-13 ENCOUNTER — Other Ambulatory Visit: Payer: Self-pay

## 2019-12-13 MED ORDER — POTASSIUM CHLORIDE ER 10 MEQ PO TBCR: 20.0000 meq | EXTENDED_RELEASE_TABLET | Freq: Two times a day (BID) | ORAL | 2 refills | Status: DC

## 2019-12-14 ENCOUNTER — Other Ambulatory Visit: Payer: Self-pay

## 2019-12-14 MED ORDER — POTASSIUM CHLORIDE ER 10 MEQ PO TBCR: 20.0000 meq | EXTENDED_RELEASE_TABLET | Freq: Two times a day (BID) | ORAL | 3 refills | Status: DC

## 2019-12-15 ENCOUNTER — Other Ambulatory Visit: Payer: Self-pay

## 2019-12-15 MED ORDER — POTASSIUM CHLORIDE ER 10 MEQ PO TBCR: 20.0000 meq | EXTENDED_RELEASE_TABLET | Freq: Two times a day (BID) | ORAL | 1 refills | Status: DC

## 2019-12-18 ENCOUNTER — Telehealth: Payer: Self-pay | Admitting: Nurse Practitioner

## 2019-12-19 ENCOUNTER — Other Ambulatory Visit: Payer: Self-pay | Admitting: Nurse Practitioner

## 2019-12-19 DIAGNOSIS — F411 Generalized anxiety disorder: Secondary | ICD-10-CM

## 2019-12-19 MED ORDER — ALPRAZOLAM 0.25 MG PO TABS: 0.2500 mg | ORAL_TABLET | Freq: Two times a day (BID) | ORAL | 2 refills | Status: DC | PRN

## 2019-12-19 NOTE — Progress Notes (Signed)
Renewed prescription for patient's alprazolam and sent to [facility].

## 2019-12-19 NOTE — Telephone Encounter (Signed)
Renewed prescription for patient's alprazolam and sent to [facility].

## 2019-12-19 NOTE — Telephone Encounter (Signed)
Which [facility] pharmacy is what I need to know.

## 2019-12-19 NOTE — Telephone Encounter (Signed)
[facility] on [address]

## 2019-12-22 ENCOUNTER — Telehealth: Payer: Self-pay

## 2019-12-22 NOTE — Telephone Encounter (Signed)
Confirmed appointment on 12/25/2019 and screened for covid. [facility] 

## 2019-12-26 ENCOUNTER — Encounter: Payer: Self-pay | Admitting: Internal Medicine

## 2019-12-26 ENCOUNTER — Ambulatory Visit: Payer: Medicare Other | Admitting: Internal Medicine

## 2019-12-26 ENCOUNTER — Other Ambulatory Visit: Payer: Self-pay

## 2019-12-26 VITALS — BP 141/89 | HR 83 | Temp 98.0°F | Resp 16 | Ht <= 58 in | Wt 186.0 lb

## 2019-12-26 DIAGNOSIS — Z9989 Dependence on other enabling machines and devices: Secondary | ICD-10-CM

## 2019-12-26 DIAGNOSIS — Z9981 Dependence on supplemental oxygen: Secondary | ICD-10-CM | POA: Diagnosis not present

## 2019-12-26 DIAGNOSIS — J449 Chronic obstructive pulmonary disease, unspecified: Secondary | ICD-10-CM | POA: Diagnosis not present

## 2019-12-26 DIAGNOSIS — F411 Generalized anxiety disorder: Secondary | ICD-10-CM

## 2019-12-26 DIAGNOSIS — J9611 Chronic respiratory failure with hypoxia: Secondary | ICD-10-CM

## 2019-12-26 DIAGNOSIS — R0602 Shortness of breath: Secondary | ICD-10-CM

## 2019-12-26 DIAGNOSIS — G4733 Obstructive sleep apnea (adult) (pediatric): Secondary | ICD-10-CM

## 2019-12-26 MED ORDER — TRELEGY ELLIPTA 100-62.5-25 MCG/INH IN AEPB: 1.0000 | INHALATION_SPRAY | Freq: Every day | RESPIRATORY_TRACT | 3 refills | Status: DC

## 2019-12-26 NOTE — Progress Notes (Signed)
[facility] [place], [place] [address]  Pulmonary Sleep Medicine   Office Visit Note  Patient Name: [name] DOB: Jul 14, 1946 MRN [id number]  Date of Service: 12/26/2019  Complaints/HPI: PT is here for pulmonary follow up.  She has a history of OSA, Chronic respiratory failure.  She reports her breathing has felt worse lately.  She states that she is using her oxygen 2-3 LPM. Pt reports good compliance with CPAP therapy. Cleaning machine by hand, and changing filters and tubing as directed. Denies headaches, sinus issues, palpitations, or hemoptysis.       ROS  General: (-) fever, (-) chills, (-) night sweats, (-) weakness Skin: (-) rashes, (-) itching,. Eyes: (-) visual changes, (-) redness, (-) itching. Nose and Sinuses: (-) nasal stuffiness or itchiness, (-) postnasal drip, (-) nosebleeds, (-) sinus trouble. Mouth and Throat: (-) sore throat, (-) hoarseness. Neck: (-) swollen glands, (-) enlarged thyroid, (-) neck pain. Respiratory: - cough, (-) bloody sputum, - shortness of breath, - wheezing. Cardiovascular: - ankle swelling, (-) chest pain. Lymphatic: (-) lymph node enlargement. Neurologic: (-) numbness, (-) tingling. Psychiatric: (-) anxiety, (-) depression   Current Medication: Outpatient Encounter Medications as of 12/26/2019  Medication Sig  . acetaminophen (TYLENOL) 500 MG tablet Take 500 mg by mouth every 4 (four) hours as needed for moderate pain or headache.   . albuterol (PROVENTIL HFA;VENTOLIN HFA) 108 (90 Base) MCG/ACT inhaler Inhale 2 puffs into the lungs every 6 (six) hours as needed for wheezing or shortness of breath.  . ALPRAZolam (XANAX) 0.25 MG tablet Take 1 tablet (0.25 mg total) by mouth 2 (two) times daily as needed for anxiety.  . Artificial Tear Solution (GENTEAL TEARS) 0.1-0.2-0.3 % SOLN Place 1 drop into both eyes daily.  [name] atorvastatin (LIPITOR) 40 MG tablet 1 tab po qhs (Patient taking differently: Take 40 mg by mouth  at bedtime. 1 tab po qhs)  . Coenzyme Q10 (COQ10) 200 MG CAPS Take 200 mg by mouth daily.  . diclofenac sodium (VOLTAREN) 1 % GEL To the affected joint (Patient taking differently: Apply 2-4 g topically 3 (three) times daily as needed (shoulder pain.). To the affected joint )  . diltiazem (CARDIZEM CD) 120 MG 24 hr capsule Take 1 capsule by mouth daily.  . DULoxetine (CYMBALTA) 60 MG capsule Take 1 capsule (60 mg total) by mouth 2 (two) times daily.  [name] ELIQUIS 5 MG TABS tablet Take 1 tablet by mouth twice daily.  . ferrous sulfate (FERROUSUL) 325 (65 FE) MG tablet Take 650 mg by mouth daily with breakfast.   . flecainide (TAMBOCOR) 50 MG tablet Take 1 tablet by mouth twice daily.  . hydrALAZINE (APRESOLINE) 25 MG tablet Take 1 tablet (25 mg total) by mouth 3 (three) times daily.  [name] levothyroxine (SYNTHROID) 25 MCG tablet Take 1 tablet (25 mcg total) by mouth daily before breakfast.  . lisinopril-hydrochlorothiazide (ZESTORETIC) 20-25 MG tablet Take 1 tablet by mouth daily.  [name] loratadine (CLARITIN) 10 MG tablet Take 10 mg by mouth daily as needed for allergies.   . OXYGEN Inhale into the lungs.  . pantoprazole (PROTONIX) 40 MG tablet Take 1 tablet (40 mg total) by mouth daily.  . potassium chloride (KLOR-CON) 10 MEQ tablet Take 2 tablets (20 mEq total) by mouth 2 (two) times daily.  . sodium chloride (OCEAN) 0.65 % SOLN nasal spray Place 1 spray into both nostrils daily as needed for congestion.  . Tiotropium Bromide-Olodaterol (STIOLTO RESPIMAT) 2.5-2.5 MCG/ACT AERS Inhale 1  puff into the lungs daily.  . traMADol (ULTRAM) 50 MG tablet Take 50 mg by mouth 3 (three) times daily as needed.  . traZODone (DESYREL) 50 MG tablet Take 1 tablet (50 mg total) by mouth at bedtime.  . TRELEGY ELLIPTA 100-62.5-25 MCG/INH AEPB Inhale 1 puff by mouth daily.   No facility-administered encounter medications on file as of 12/26/2019.    Surgical History: Past Surgical History:  Procedure Laterality Date  .  ABDOMINAL AORTIC ANEURYSM REPAIR  2008  . ABDOMINAL HYSTERECTOMY    . AORTIC VALVE REPLACEMENT  2008  . CARDIAC CATHETERIZATION     [facility]  . CARDIOVERSION N/A 08/15/2018   Procedure: CARDIOVERSION;  Surgeon: [name], MD;  Location: [facility] ORS;  Service: Cardiovascular;  Laterality: N/A;  . CARDIOVERSION N/A 11/14/2018   Procedure: CARDIOVERSION (CATH LAB);  Surgeon: [name], MD;  Location: [facility] ORS;  Service: Cardiovascular;  Laterality: N/A;  . CARDIOVERSION N/A 06/05/2019   Procedure: CARDIOVERSION;  Surgeon: [name], MD;  Location: [facility] ORS;  Service: Cardiovascular;  Laterality: N/A;  . CARDIOVERSION N/A 08/14/2019   Procedure: CARDIOVERSION;  Surgeon: [name], MD;  Location: [facility] ORS;  Service: Cardiovascular;  Laterality: N/A;  . CARDIOVERSION N/A 11/09/2019   Procedure: CARDIOVERSION;  Surgeon: Isaias Cowman, MD;  Location: [facility] ORS;  Service: Cardiovascular;  Laterality: N/A;  . CARPAL TUNNEL RELEASE    . ELECTROPHYSIOLOGIC STUDY N/A 08/17/2016   Procedure: Cardioversion;  Surgeon: [name], MD;  Location: [facility] ORS;  Service: Cardiovascular;  Laterality: N/A;  . TUMOR EXCISION Left    x3 (arm)    Medical History: Past Medical History:  Diagnosis Date  . 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 s/p bioprosthetic valve replacement 2008 at [facility];  b. 01/2015 Echo: EF 60-65%, no rwma, Gr1 DD, mildly dil LA, nl RV fxn.  . Atrial fibrillation with RVR ([place]) 01/11/2015  . Atrial flutter ([place])    a. 08/2016 s/p DCCV.  Remains on flecainide 50 mg bid.  . Basal skull fracture ([place]) 20 yrs ago  . Chronic respiratory failure ([place])   . COPD (chronic obstructive pulmonary disease) ([place])    a. on home O2 at 2L since 2008  . 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 Essential hypertension 01/24/2015  . History of cardiac cath    a. 2008 prior to Aortic aneurysm repair-->nl cors.  . History of stress test    a. 10/2015  MV: no ischemia/infarct.  [name] HLD (hyperlipidemia)   . HTN (hypertension)   . Hypothyroidism   . Obesity   . PAF (paroxysmal atrial fibrillation) ([name])    a. on Eliquis; b. CHADS2VASc = 3 (HTN, age x 1, female).  . Paroxysmal atrial fibrillation ([place]) 01/24/2015  . Right upper quadrant abdominal tenderness without rebound tenderness 03/02/2018  . S/P ascending aortic aneurysm repair 2008    Family History: Family History  Problem Relation Age of Onset  . Stroke Father   . Stroke Paternal Grandmother     Social History: Social History   Socioeconomic History  . Marital status: Single    Spouse name: Not on file  . Number of children: Not on file  . Years of education: Not on file  . Highest education level: Not on file  Occupational History  . Not on file  Tobacco Use  . Smoking status: Former Smoker    Types: Cigarettes  .  Smokeless tobacco: Never Used  Substance and Sexual Activity  . Alcohol use: No  . Drug use: No  . Sexual activity: Never    Birth control/protection: Surgical  Other Topics Concern  . Not on file  Social History Narrative  . Not on file   Social Determinants of Health   Financial Resource Strain:   . Difficulty of Paying Living Expenses:   Food Insecurity:   . Worried About Charity fundraiser in the Last Year:   . Arboriculturist in the Last Year:   Transportation Needs:   . Film/video editor (Medical):   [name] Lack of Transportation (Non-Medical):   Physical Activity:   . Days of Exercise per Week:   . Minutes of Exercise per Session:   Stress:   . Feeling of Stress :   Social Connections:   . Frequency of Communication with Friends and Family:   . Frequency of Social Gatherings with Friends and Family:   . Attends Religious Services:   . Active Member of Clubs or Organizations:   . Attends Archivist Meetings:   [name] Marital Status:   Intimate Partner Violence:   . Fear of Current or Ex-Partner:   . Emotionally Abused:    [name] Physically Abused:   . Sexually Abused:     Vital Signs: Blood pressure (!) 141/89, pulse 83, temperature 98 F (36.7 C), resp. rate 16, height 4\' 10"  (1.473 m), weight 186 lb (84.4 kg), SpO2 92 %.  Examination: General Appearance: The patient is well-developed, well-nourished, and in no distress. Skin: Gross inspection of skin unremarkable. Head: normocephalic, no gross deformities. Eyes: no gross deformities noted. ENT: ears appear grossly normal no exudates. Neck: Supple. No thyromegaly. No LAD. Respiratory: clear bilaterally. Cardiovascular: Normal S1 and S2 without murmur or rub. Extremities: No cyanosis. pulses are equal. Neurologic: Alert and oriented. No involuntary movements.  LABS: Recent Results (from the past 2160 hour(s))  SARS CORONAVIRUS 2 (TAT 6-24 HRS) Nasopharyngeal Nasopharyngeal Swab     Status: None   Collection Time: 11/07/19  8:42 AM   Specimen: Nasopharyngeal Swab  Result Value Ref Range   SARS Coronavirus 2 NEGATIVE NEGATIVE    Comment: (NOTE) SARS-CoV-2 target nucleic acids are NOT DETECTED. The SARS-CoV-2 RNA is generally detectable in upper and lower respiratory specimens during the acute phase of infection. Negative results do not preclude SARS-CoV-2 infection, do not rule out co-infections with other pathogens, and should not be used as the sole basis for treatment or other patient management decisions. Negative results must be combined with clinical observations, patient history, and epidemiological information. The expected result is Negative. Fact Sheet for Patients: SugarRoll.be Fact Sheet for Healthcare Providers: https://www.woods-mathews.com/ This test is not yet approved or cleared by the [place] FDA and  has been authorized for detection and/or diagnosis of SARS-CoV-2 by FDA under an Emergency Use Authorization (EUA). This EUA will remain  in effect (meaning this test can be used) for  the duration of the COVID-19 declaration under Section 56 4(b)(1) of the Act, 21 U.S.C. section 360bbb-3(b)(1), unless the authorization is terminated or revoked sooner. Performed at [facility], [place] [address], [place], [place] [address]     Radiology: No results found.  No results found.  No results found.    Assessment and Plan: Patient Active Problem List   Diagnosis Date Noted  . Episode of moderate major depression ([place]) 10/10/2019  . Persistent atrial fibrillation ([place])   . Encounter  for general adult medical examination with abnormal findings 07/10/2019  . Encounter for screening mammogram for malignant neoplasm of breast 07/10/2019  . Atopic dermatitis 05/02/2019  . Paroxysmal atrial flutter ([place]) 08/11/2018  . Encounter for long-term (current) use of medications 07/09/2018  . Chronic left shoulder pain 07/09/2018  . Conjunctivitis 07/09/2018  . Oxygen dependent 07/09/2018  . GAD (generalized anxiety disorder) 07/09/2018  . Ovarian failure 07/09/2018  . Positive colorectal cancer screening using Cologuard test 04/24/2018  . Calculus of gallbladder with acute on chronic cholecystitis 04/08/2018  . H/O: CVA (cerebrovascular accident) 04/08/2018  . Dysuria 03/02/2018  . Urinary tract infection without hematuria 03/02/2018  . Right upper quadrant abdominal tenderness without rebound tenderness 03/02/2018  . 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 exacerbation ([place]) 03/02/2018  . Chronic obstructive pulmonary disease ([place]) 09/19/2017  . Typical atrial flutter ([place])   . Irritable bowel syndrome without diarrhea 01/29/2015  . Essential hypertension 01/24/2015  . Paroxysmal atrial fibrillation ([place]) 01/24/2015  . History of aortic valve replacement with bioprosthetic valve 01/24/2015  . Atrial fibrillation with RVR ([place]) 01/11/2015  . 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 Impingement syndrome of shoulder 05/18/2014  .  Cellulitis and abscess 02/13/2014  . MRSA (methicillin resistant Staphylococcus aureus) 02/13/2014  . Aneurysm, ascending aorta ([place]) 06/23/2013  . Bicuspid aortic valve 06/23/2013    1. OSA on CPAP Continue to wear cpap as directed when sleeping  2. Chronic respiratory failure with hypoxia ([name]) Stable, continue to use oxygen continually.   - Pulmonary Function Test; Future  3. Oxygen dependent Continue to use 2-3lPM continually as needed.   4. Chronic obstructive pulmonary disease, unspecified COPD type ([place]) Continue to use trelegy as directed.  Sample of Breztri to try.  - Fluticasone-Umeclidin-Vilant (TRELEGY ELLIPTA) 100-62.5-25 MCG/INH AEPB; Inhale 1 puff into the lungs daily.  Dispense: 60 each; Refill: 3 - Pulmonary Function Test; Future  5. Shortness of breath - Spirometry with Graph  6. GAD (generalized anxiety disorder) Controlled, continue present management.   General Counseling: I have discussed the findings of the evaluation and examination with Amea.  I have also discussed any further diagnostic evaluation thatmay be needed or ordered today. Zahriyah verbalizes understanding of the findings of todays visit. We also reviewed her medications today and discussed drug interactions and side effects including but not limited excessive drowsiness and altered mental states. We also discussed that there is always a risk not just to her but also people around her. she has been encouraged to call the office with any questions or concerns that should arise related to todays visit.  Orders Placed This Encounter  Procedures  . Spirometry with Graph    Order Specific Question:   Where should this test be performed?    Answer:   Other     Time spent: 30 This patient was seen by Orson Gear AGNP-C in Collaboration with Dr. Devona Konig as a part of collaborative care agreement.   I have personally obtained a history, examined the patient, evaluated laboratory and imaging  results, formulated the assessment and plan and placed orders.    Allyne Gee, MD [facility] Pulmonary and Critical Care Sleep medicine

## 2019-12-27 ENCOUNTER — Other Ambulatory Visit: Payer: Self-pay | Admitting: Cardiovascular Disease

## 2019-12-27 ENCOUNTER — Other Ambulatory Visit: Payer: Self-pay

## 2019-12-27 MED ORDER — LEVOTHYROXINE SODIUM 25 MCG PO TABS: 25.0000 ug | ORAL_TABLET | Freq: Every day | ORAL | 1 refills | Status: DC

## 2019-12-27 MED ORDER — PANTOPRAZOLE SODIUM 40 MG PO TBEC: 40.0000 mg | DELAYED_RELEASE_TABLET | Freq: Every day | ORAL | 1 refills | Status: DC

## 2019-12-28 ENCOUNTER — Telehealth: Payer: Self-pay

## 2019-12-28 NOTE — Telephone Encounter (Signed)
Faxed RX to [facility] for Oxygen. beth

## 2020-01-01 DIAGNOSIS — J449 Chronic obstructive pulmonary disease, unspecified: Secondary | ICD-10-CM | POA: Diagnosis not present

## 2020-01-04 ENCOUNTER — Telehealth: Payer: Self-pay

## 2020-01-04 NOTE — Telephone Encounter (Signed)
Confirmed and screened for 01-08-20 ov. 

## 2020-01-08 ENCOUNTER — Ambulatory Visit: Payer: Medicare Other | Admitting: Nurse Practitioner

## 2020-01-09 DIAGNOSIS — I4891 Unspecified atrial fibrillation: Secondary | ICD-10-CM | POA: Diagnosis not present

## 2020-01-09 DIAGNOSIS — Q231 Congenital insufficiency of aortic valve: Secondary | ICD-10-CM | POA: Diagnosis not present

## 2020-01-09 DIAGNOSIS — I10 Essential (primary) hypertension: Secondary | ICD-10-CM | POA: Diagnosis not present

## 2020-01-09 DIAGNOSIS — I480 Paroxysmal atrial fibrillation: Secondary | ICD-10-CM | POA: Diagnosis not present

## 2020-01-15 ENCOUNTER — Other Ambulatory Visit
Admission: RE | Admit: 2020-01-15 | Discharge: 2020-01-15 | Disposition: A | Discharge status: Home / Self Care | Payer: Medicare Other | Source: Ambulatory Visit | Attending: Cardiology | Admitting: Cardiology

## 2020-01-15 DIAGNOSIS — Z01812 Encounter for preprocedural laboratory examination: Secondary | ICD-10-CM | POA: Insufficient documentation

## 2020-01-15 DIAGNOSIS — Z20822 Contact with and (suspected) exposure to covid-19: Secondary | ICD-10-CM | POA: Insufficient documentation

## 2020-01-15 LAB — SARS CORONAVIRUS 2 (TAT 6-24 HRS): SARS Coronavirus 2: NEGATIVE

## 2020-01-17 ENCOUNTER — Ambulatory Visit: Payer: Medicare Other | Admitting: Anesthesiology

## 2020-01-17 ENCOUNTER — Encounter
Admission: RE | Disposition: A | Discharge status: Home / Self Care | Payer: Self-pay | Source: Home / Self Care | Attending: Cardiology

## 2020-01-17 ENCOUNTER — Other Ambulatory Visit: Payer: Self-pay

## 2020-01-17 ENCOUNTER — Ambulatory Visit: Payer: Medicare Other | Admitting: Internal Medicine

## 2020-01-17 ENCOUNTER — Ambulatory Visit
Admission: RE | Admit: 2020-01-17 | Discharge: 2020-01-17 | Disposition: A | Discharge status: Home / Self Care | Payer: Medicare Other | Attending: Cardiology | Admitting: Cardiology

## 2020-01-17 ENCOUNTER — Encounter: Payer: Self-pay | Admitting: Cardiology

## 2020-01-17 DIAGNOSIS — I10 Essential (primary) hypertension: Secondary | ICD-10-CM | POA: Insufficient documentation

## 2020-01-17 DIAGNOSIS — Z79899 Other long term (current) drug therapy: Secondary | ICD-10-CM | POA: Insufficient documentation

## 2020-01-17 DIAGNOSIS — D649 Anemia, unspecified: Secondary | ICD-10-CM | POA: Insufficient documentation

## 2020-01-17 DIAGNOSIS — Z7901 Long term (current) use of anticoagulants: Secondary | ICD-10-CM | POA: Insufficient documentation

## 2020-01-17 DIAGNOSIS — R0602 Shortness of breath: Secondary | ICD-10-CM | POA: Insufficient documentation

## 2020-01-17 DIAGNOSIS — Z9981 Dependence on supplemental oxygen: Secondary | ICD-10-CM | POA: Insufficient documentation

## 2020-01-17 DIAGNOSIS — M810 Age-related osteoporosis without current pathological fracture: Secondary | ICD-10-CM | POA: Diagnosis not present

## 2020-01-17 DIAGNOSIS — Z952 Presence of prosthetic heart valve: Secondary | ICD-10-CM | POA: Insufficient documentation

## 2020-01-17 DIAGNOSIS — Z8673 Personal history of transient ischemic attack (TIA), and cerebral infarction without residual deficits: Secondary | ICD-10-CM | POA: Insufficient documentation

## 2020-01-17 DIAGNOSIS — E785 Hyperlipidemia, unspecified: Secondary | ICD-10-CM | POA: Diagnosis not present

## 2020-01-17 DIAGNOSIS — F329 Major depressive disorder, single episode, unspecified: Secondary | ICD-10-CM | POA: Diagnosis not present

## 2020-01-17 DIAGNOSIS — Z79890 Hormone replacement therapy (postmenopausal): Secondary | ICD-10-CM | POA: Insufficient documentation

## 2020-01-17 DIAGNOSIS — R5383 Other fatigue: Secondary | ICD-10-CM | POA: Diagnosis not present

## 2020-01-17 DIAGNOSIS — R42 Dizziness and giddiness: Secondary | ICD-10-CM | POA: Insufficient documentation

## 2020-01-17 DIAGNOSIS — E039 Hypothyroidism, unspecified: Secondary | ICD-10-CM | POA: Diagnosis not present

## 2020-01-17 DIAGNOSIS — I480 Paroxysmal atrial fibrillation: Secondary | ICD-10-CM | POA: Insufficient documentation

## 2020-01-17 DIAGNOSIS — J449 Chronic obstructive pulmonary disease, unspecified: Secondary | ICD-10-CM | POA: Insufficient documentation

## 2020-01-17 DIAGNOSIS — G4730 Sleep apnea, unspecified: Secondary | ICD-10-CM | POA: Insufficient documentation

## 2020-01-17 DIAGNOSIS — F419 Anxiety disorder, unspecified: Secondary | ICD-10-CM | POA: Diagnosis not present

## 2020-01-17 HISTORY — PX: CARDIOVERSION: SHX1299

## 2020-01-17 SURGERY — CARDIOVERSION
Anesthesia: General

## 2020-01-17 MED ORDER — PROPOFOL 10 MG/ML IV BOLUS
INTRAVENOUS | Status: DC | PRN
  Administered 2020-01-17: 40 mg via INTRAVENOUS

## 2020-01-17 MED ORDER — PROPOFOL 10 MG/ML IV BOLUS
INTRAVENOUS | Status: AC
  Filled 2020-01-17: qty 20

## 2020-01-17 NOTE — Op Note (Signed)
[facility] Cardiology   01/17/2020                     7:41 AM  PATIENT:  [name]    PRE-OPERATIVE DIAGNOSIS:  Cardioversion   Afib  [facility] called Anesthesia for 2nd case approval  Start time 8a  POST-OPERATIVE DIAGNOSIS:  Same  PROCEDURE:  CARDIOVERSION  SURGEON:  Isaias Cowman, MD    ANESTHESIA:     PREOPERATIVE INDICATIONS:  [name] is a  74 y.o. female with a diagnosis of Cardioversion   Afib  [facility] Cardiology called Anesthesia for 2nd case approval  Start time 8a who failed conservative measures and elected for surgical management.    The risks benefits and alternatives were discussed with the patient preoperatively including but not limited to the risks of infection, bleeding, cardiopulmonary complications, the need for revision surgery, among others, and the patient was willing to proceed.   OPERATIVE PROCEDURE: The patient was in a fasting state.  She received 40 mg of propofol.  Elective cardioversion was performed with 75 J with successful conversion to sinus rhythm at 82 bpm.  There were no complications.

## 2020-01-17 NOTE — Anesthesia Preprocedure Evaluation (Signed)
Anesthesia Evaluation  Patient identified by MRN, date of birth, ID band Patient awake    Reviewed: Allergy & Precautions, NPO status , Patient's Chart, lab work & pertinent test results  History of Anesthesia Complications Negative for: history of anesthetic complications  Airway Mallampati: III  TM Distance: >3 FB Neck ROM: Full    Dental  (+) Teeth Intact, Poor Dentition   Pulmonary shortness of breath and at rest, neg sleep apnea, COPD,  COPD inhaler and oxygen dependent, Patient abstained from smoking.Not current smoker, former smoker,  Normal PFTs in 2021 however   Pulmonary exam normal breath sounds clear to auscultation       Cardiovascular Exercise Tolerance: Poor METShypertension, Pt. on medications (-) CAD and (-) Past MI + dysrhythmias Atrial Fibrillation  Rhythm:Irregular Rate:Normal - Systolic murmurs    Neuro/Psych PSYCHIATRIC DISORDERS Anxiety Depression negative neurological ROS     GI/Hepatic neg GERD  ,(+)     (-) substance abuse  ,   Endo/Other  neg diabetesHypothyroidism   Renal/GU negative Renal ROS     Musculoskeletal   Abdominal   Peds  Hematology   Anesthesia Other Findings Past Medical History: No date: Aortic stenosis due to bicuspid aortic valve     Comment:  a. s/p bioprosthetic valve replacement 2008 at [facility];  b.              01/2015 Echo: EF 60-65%, no rwma, Gr1 DD, mildly dil LA,               nl RV fxn. 01/11/2015: Atrial fibrillation with RVR ([place]) No date: Atrial flutter ([place])     Comment:  a. 08/2016 s/p DCCV.  Remains on flecainide 50 mg bid. 20 yrs ago: Basal skull fracture ([name]) No date: Chronic respiratory failure ([name]) No date: COPD (chronic obstructive pulmonary disease) ([place])     Comment:  a. 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 2008 prior to Aortic aneurysm repair-->nl cors. No date: History of stress test     Comment:  a. 10/2015 MV: no ischemia/infarct. No date: HLD (hyperlipidemia) No date: HTN (hypertension) No date: Hypothyroidism No date: Obesity No date: PAF (paroxysmal atrial fibrillation) ([name])     Comment:  a. on Eliquis; b. CHADS2VASc = 3 (HTN, age x 53, female). 01/24/2015: Paroxysmal atrial fibrillation ([place]) 03/02/2018: Right upper quadrant abdominal tenderness without rebound  tenderness 2008: S/P ascending aortic aneurysm repair  Reproductive/Obstetrics                             Anesthesia Physical  Anesthesia Plan  ASA: IV  Anesthesia Plan: General   Post-op Pain Management:    Induction: Intravenous  PONV Risk Score and Plan: 3 and TIVA and Propofol infusion  Airway Management Planned: Natural Airway and Nasal Cannula  Additional Equipment: None  Intra-op Plan:   Post-operative Plan: Extubation in OR  Informed Consent: I have reviewed the patients History and Physical, chart, labs and discussed the procedure including the risks, benefits and alternatives for the proposed anesthesia with the patient or authorized representative who has indicated his/her understanding and acceptance.     Dental advisory given  Plan Discussed with: CRNA and Surgeon  Anesthesia Plan Comments: (Discussed risks of anesthesia with patient, including PONV. Rare  risks discussed as well, such as cardiorespiratory and neurological sequelae, conversion to GETA. Patient understands. Patient counseled on being higher risk for anesthesia. Patient was told about increased risk of cardiac and respiratory events, including death. Patient understands. )        Anesthesia Quick Evaluation

## 2020-01-17 NOTE — Anesthesia Postprocedure Evaluation (Signed)
Anesthesia Post Note  Patient: [name]  Procedure(s) Performed: CARDIOVERSION (N/A )  Patient location during evaluation: PACU Anesthesia Type: General Level of consciousness: awake and alert Pain management: pain level controlled Vital Signs Assessment: post-procedure vital signs reviewed and stable Respiratory status: spontaneous breathing, nonlabored ventilation, respiratory function stable and patient connected to nasal cannula oxygen Cardiovascular status: blood pressure returned to baseline and stable Postop Assessment: no apparent nausea or vomiting Anesthetic complications: no     Last Vitals:  Vitals:   01/17/20 0743 01/17/20 0745  BP: 119/67 120/72  Pulse: 82 81  Resp: (!) 22 (!) 21  Temp:    SpO2: 94% 93%    Last Pain:  Vitals:   01/17/20 0722  TempSrc: Oral  PainSc: 0-No pain                 Arita Miss

## 2020-01-17 NOTE — Transfer of Care (Signed)
Immediate Anesthesia Transfer of Care Note  Patient: [name]  Procedure(s) Performed: CARDIOVERSION (N/A )  Patient Location: spu  Anesthesia Type:General  Level of Consciousness: awake  Airway & Oxygen Therapy: Patient Spontanous Breathing and Patient connected to nasal cannula oxygen  Post-op Assessment: Report given to RN and Post -op Vital signs reviewed and stable  Post vital signs: Reviewed  Last Vitals:  Vitals Value Taken Time  BP 119/67 01/17/20 0743  Temp    Pulse 82 01/17/20 0743  Resp 23 01/17/20 0743  SpO2 93 % 01/17/20 0743  Vitals shown include unvalidated device data.  Last Pain:  Vitals:   01/17/20 0722  TempSrc: Oral  PainSc: 0-No pain         Complications: No apparent anesthesia complications

## 2020-01-21 DIAGNOSIS — I480 Paroxysmal atrial fibrillation: Secondary | ICD-10-CM | POA: Diagnosis not present

## 2020-01-22 DIAGNOSIS — I712 Thoracic aortic aneurysm, without rupture: Secondary | ICD-10-CM | POA: Diagnosis not present

## 2020-01-22 DIAGNOSIS — Q231 Congenital insufficiency of aortic valve: Secondary | ICD-10-CM | POA: Diagnosis not present

## 2020-01-22 DIAGNOSIS — I10 Essential (primary) hypertension: Secondary | ICD-10-CM | POA: Diagnosis not present

## 2020-01-22 DIAGNOSIS — I480 Paroxysmal atrial fibrillation: Secondary | ICD-10-CM | POA: Diagnosis not present

## 2020-01-23 ENCOUNTER — Ambulatory Visit: Payer: Medicare Other | Admitting: Internal Medicine

## 2020-01-24 ENCOUNTER — Other Ambulatory Visit: Payer: Self-pay

## 2020-01-24 ENCOUNTER — Other Ambulatory Visit: Payer: Self-pay | Admitting: Internal Medicine

## 2020-01-24 MED ORDER — FLECAINIDE ACETATE 50 MG PO TABS: 50.0000 mg | ORAL_TABLET | Freq: Two times a day (BID) | ORAL | 0 refills | Status: DC

## 2020-01-25 ENCOUNTER — Ambulatory Visit: Payer: Medicare Other | Admitting: Nurse Practitioner

## 2020-01-26 ENCOUNTER — Telehealth: Payer: Self-pay

## 2020-01-26 NOTE — Telephone Encounter (Signed)
Lmom to confirm and screen for 01-30-20 ov.

## 2020-01-30 ENCOUNTER — Telehealth: Payer: Self-pay

## 2020-01-30 ENCOUNTER — Ambulatory Visit: Payer: Medicare Other | Admitting: Nurse Practitioner

## 2020-01-30 NOTE — Telephone Encounter (Signed)
Patient cancelled appointment on 02/01/2020 will call back to reschedule. klh

## 2020-01-31 DIAGNOSIS — J449 Chronic obstructive pulmonary disease, unspecified: Secondary | ICD-10-CM | POA: Diagnosis not present

## 2020-02-07 DIAGNOSIS — I4821 Permanent atrial fibrillation: Secondary | ICD-10-CM | POA: Diagnosis not present

## 2020-02-07 DIAGNOSIS — Z20822 Contact with and (suspected) exposure to covid-19: Secondary | ICD-10-CM | POA: Diagnosis not present

## 2020-02-07 DIAGNOSIS — I480 Paroxysmal atrial fibrillation: Secondary | ICD-10-CM | POA: Diagnosis not present

## 2020-02-07 DIAGNOSIS — Z7901 Long term (current) use of anticoagulants: Secondary | ICD-10-CM | POA: Diagnosis not present

## 2020-02-07 DIAGNOSIS — E785 Hyperlipidemia, unspecified: Secondary | ICD-10-CM | POA: Diagnosis not present

## 2020-02-07 DIAGNOSIS — Z950 Presence of cardiac pacemaker: Secondary | ICD-10-CM | POA: Diagnosis not present

## 2020-02-07 DIAGNOSIS — J449 Chronic obstructive pulmonary disease, unspecified: Secondary | ICD-10-CM | POA: Diagnosis not present

## 2020-02-07 DIAGNOSIS — I2510 Atherosclerotic heart disease of native coronary artery without angina pectoris: Secondary | ICD-10-CM | POA: Diagnosis not present

## 2020-02-07 DIAGNOSIS — Z79899 Other long term (current) drug therapy: Secondary | ICD-10-CM | POA: Diagnosis not present

## 2020-02-07 DIAGNOSIS — Z8673 Personal history of transient ischemic attack (TIA), and cerebral infarction without residual deficits: Secondary | ICD-10-CM | POA: Diagnosis not present

## 2020-02-07 DIAGNOSIS — G4733 Obstructive sleep apnea (adult) (pediatric): Secondary | ICD-10-CM | POA: Diagnosis not present

## 2020-02-07 DIAGNOSIS — Z01810 Encounter for preprocedural cardiovascular examination: Secondary | ICD-10-CM | POA: Diagnosis not present

## 2020-02-07 DIAGNOSIS — I10 Essential (primary) hypertension: Secondary | ICD-10-CM | POA: Diagnosis not present

## 2020-02-08 DIAGNOSIS — I4821 Permanent atrial fibrillation: Secondary | ICD-10-CM | POA: Diagnosis not present

## 2020-02-08 DIAGNOSIS — I10 Essential (primary) hypertension: Secondary | ICD-10-CM | POA: Diagnosis not present

## 2020-02-08 DIAGNOSIS — Z7901 Long term (current) use of anticoagulants: Secondary | ICD-10-CM | POA: Diagnosis not present

## 2020-02-08 DIAGNOSIS — Z79899 Other long term (current) drug therapy: Secondary | ICD-10-CM | POA: Diagnosis not present

## 2020-02-08 DIAGNOSIS — G4733 Obstructive sleep apnea (adult) (pediatric): Secondary | ICD-10-CM | POA: Diagnosis not present

## 2020-02-08 DIAGNOSIS — Z8673 Personal history of transient ischemic attack (TIA), and cerebral infarction without residual deficits: Secondary | ICD-10-CM | POA: Diagnosis not present

## 2020-02-08 DIAGNOSIS — J449 Chronic obstructive pulmonary disease, unspecified: Secondary | ICD-10-CM | POA: Diagnosis not present

## 2020-02-08 DIAGNOSIS — I2510 Atherosclerotic heart disease of native coronary artery without angina pectoris: Secondary | ICD-10-CM | POA: Diagnosis not present

## 2020-02-08 DIAGNOSIS — Z20822 Contact with and (suspected) exposure to covid-19: Secondary | ICD-10-CM | POA: Diagnosis not present

## 2020-02-08 DIAGNOSIS — E785 Hyperlipidemia, unspecified: Secondary | ICD-10-CM | POA: Diagnosis not present

## 2020-02-08 DIAGNOSIS — Z950 Presence of cardiac pacemaker: Secondary | ICD-10-CM | POA: Diagnosis not present

## 2020-02-13 ENCOUNTER — Encounter: Payer: Self-pay | Admitting: *Deleted

## 2020-02-13 ENCOUNTER — Other Ambulatory Visit: Payer: Self-pay

## 2020-02-13 ENCOUNTER — Emergency Department: Payer: Medicare Other

## 2020-02-13 DIAGNOSIS — I480 Paroxysmal atrial fibrillation: Secondary | ICD-10-CM | POA: Diagnosis not present

## 2020-02-13 DIAGNOSIS — Y93E5 Activity, floor mopping and cleaning: Secondary | ICD-10-CM | POA: Insufficient documentation

## 2020-02-13 DIAGNOSIS — G4733 Obstructive sleep apnea (adult) (pediatric): Secondary | ICD-10-CM | POA: Diagnosis not present

## 2020-02-13 DIAGNOSIS — S9031XA Contusion of right foot, initial encounter: Secondary | ICD-10-CM | POA: Diagnosis not present

## 2020-02-13 DIAGNOSIS — Z79899 Other long term (current) drug therapy: Secondary | ICD-10-CM | POA: Insufficient documentation

## 2020-02-13 DIAGNOSIS — E039 Hypothyroidism, unspecified: Secondary | ICD-10-CM | POA: Diagnosis not present

## 2020-02-13 DIAGNOSIS — J449 Chronic obstructive pulmonary disease, unspecified: Secondary | ICD-10-CM | POA: Insufficient documentation

## 2020-02-13 DIAGNOSIS — X500XXA Overexertion from strenuous movement or load, initial encounter: Secondary | ICD-10-CM | POA: Insufficient documentation

## 2020-02-13 DIAGNOSIS — Y929 Unspecified place or not applicable: Secondary | ICD-10-CM | POA: Diagnosis not present

## 2020-02-13 DIAGNOSIS — Y999 Unspecified external cause status: Secondary | ICD-10-CM | POA: Diagnosis not present

## 2020-02-13 DIAGNOSIS — I10 Essential (primary) hypertension: Secondary | ICD-10-CM | POA: Diagnosis not present

## 2020-02-13 DIAGNOSIS — Z87891 Personal history of nicotine dependence: Secondary | ICD-10-CM | POA: Insufficient documentation

## 2020-02-13 DIAGNOSIS — I4891 Unspecified atrial fibrillation: Secondary | ICD-10-CM | POA: Diagnosis not present

## 2020-02-13 DIAGNOSIS — Q231 Congenital insufficiency of aortic valve: Secondary | ICD-10-CM | POA: Diagnosis not present

## 2020-02-13 DIAGNOSIS — S99921A Unspecified injury of right foot, initial encounter: Secondary | ICD-10-CM | POA: Diagnosis present

## 2020-02-13 DIAGNOSIS — M7989 Other specified soft tissue disorders: Secondary | ICD-10-CM | POA: Diagnosis not present

## 2020-02-13 NOTE — ED Triage Notes (Signed)
Pt was cleaning on her knees and attempted to get up but twisted and injured her right foot. Pt has pain and swelling to right foot.

## 2020-02-14 ENCOUNTER — Emergency Department
Admission: EM | Admit: 2020-02-14 | Discharge: 2020-02-14 | Disposition: A | Discharge status: Home / Self Care | Payer: Medicare Other | Attending: Emergency Medicine | Admitting: Emergency Medicine

## 2020-02-14 DIAGNOSIS — S9031XA Contusion of right foot, initial encounter: Secondary | ICD-10-CM

## 2020-02-14 MED ORDER — OXYCODONE HCL 5 MG PO TABS
5.0000 mg | ORAL_TABLET | Freq: Once | ORAL | Status: AC
  Administered 2020-02-14: 5 mg via ORAL
  Filled 2020-02-14: qty 1

## 2020-02-14 MED ORDER — ACETAMINOPHEN 500 MG PO TABS
1000.0000 mg | ORAL_TABLET | Freq: Once | ORAL | Status: AC
  Administered 2020-02-14: 1000 mg via ORAL
  Filled 2020-02-14: qty 2

## 2020-02-14 NOTE — ED Provider Notes (Signed)
[facility] Emergency Department Provider Note  ____________________________________________  Time seen: Approximately 5:59 AM  I have reviewed the triage vital signs and the nursing notes.   HISTORY  Chief Complaint Foot Pain   HPI [name] is a 74 y.o. female who presents for evaluation of right foot pain.   Patient reports that she was cleaning on her knees when she try to get up.  The floor was wet and she twisted her foot.  She has been unable to bear weight.  She is complaining of diffuse pain on her right foot that is worse with weightbearing, constant and nonradiating.  She denies any other trauma.  Past Medical History:  Diagnosis Date  . 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 s/p bioprosthetic valve replacement 2008 at [facility];  b. 01/2015 Echo: EF 60-65%, no rwma, Gr1 DD, mildly dil LA, nl RV fxn.  . Atrial fibrillation with RVR ([place]) 01/11/2015  . Atrial flutter ([employer])    a. 08/2016 s/p DCCV.  Remains on flecainide 50 mg bid.  . Basal skull fracture ([place]) 20 yrs ago  . Chronic respiratory failure ([place])   . COPD (chronic obstructive pulmonary disease) ([place])    a. on home O2 at 2L since 2008  . 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 Essential hypertension 01/24/2015  . History of cardiac cath    a. 2008 prior to Aortic aneurysm repair-->nl cors.  . History of stress test    a. 10/2015 MV: no ischemia/infarct.  [name] HLD (hyperlipidemia)   . HTN (hypertension)   . Hypothyroidism   . Obesity   . PAF (paroxysmal atrial fibrillation) ([name])    a. on Eliquis; b. CHADS2VASc = 3 (HTN, age x 1, female).  . Paroxysmal atrial fibrillation ([place]) 01/24/2015  . Right upper quadrant abdominal tenderness without rebound tenderness 03/02/2018  . S/P ascending aortic aneurysm repair 2008    Patient Active Problem List   Diagnosis Date Noted  . Episode of moderate major depression ([place]) 10/10/2019  . Persistent atrial  fibrillation ([place])   . Encounter for general adult medical examination with abnormal findings 07/10/2019  . Encounter for screening mammogram for malignant neoplasm of breast 07/10/2019  . Atopic dermatitis 05/02/2019  . Paroxysmal atrial flutter ([place]) 08/11/2018  . Encounter for long-term (current) use of medications 07/09/2018  . Chronic left shoulder pain 07/09/2018  . Conjunctivitis 07/09/2018  . Oxygen dependent 07/09/2018  . GAD (generalized anxiety disorder) 07/09/2018  . Ovarian failure 07/09/2018  . Positive colorectal cancer screening using Cologuard test 04/24/2018  . Calculus of gallbladder with acute on chronic cholecystitis 04/08/2018  . H/O: CVA (cerebrovascular accident) 04/08/2018  . Dysuria 03/02/2018  . Urinary tract infection without hematuria 03/02/2018  . Right upper quadrant abdominal tenderness without rebound tenderness 03/02/2018  . 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 exacerbation ([place]) 03/02/2018  . Chronic obstructive pulmonary disease ([place]) 09/19/2017  . Typical atrial flutter ([place])   . Irritable bowel syndrome without diarrhea 01/29/2015  . Essential hypertension 01/24/2015  . Paroxysmal atrial fibrillation ([place]) 01/24/2015  . History of aortic valve replacement with bioprosthetic valve 01/24/2015  . Atrial fibrillation with RVR ([place]) 01/11/2015  . 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 Impingement syndrome of shoulder 05/18/2014  . Cellulitis and abscess 02/13/2014  . MRSA (methicillin resistant Staphylococcus aureus) 02/13/2014  . Aneurysm, ascending aorta ([place]) 06/23/2013  . Bicuspid aortic valve 06/23/2013  Past Surgical History:  Procedure Laterality Date  . ABDOMINAL AORTIC ANEURYSM REPAIR  2008  . ABDOMINAL HYSTERECTOMY    . AORTIC VALVE REPLACEMENT  2008  . CARDIAC CATHETERIZATION     [facility]  . CARDIOVERSION N/A 08/15/2018   Procedure: CARDIOVERSION;  Surgeon: [name], MD;  Location: [facility] ORS;   Service: Cardiovascular;  Laterality: N/A;  . CARDIOVERSION N/A 11/14/2018   Procedure: CARDIOVERSION (CATH LAB);  Surgeon: [name], MD;  Location: [facility] ORS;  Service: Cardiovascular;  Laterality: N/A;  . CARDIOVERSION N/A 06/05/2019   Procedure: CARDIOVERSION;  Surgeon: [name], MD;  Location: [facility] ORS;  Service: Cardiovascular;  Laterality: N/A;  . CARDIOVERSION N/A 08/14/2019   Procedure: CARDIOVERSION;  Surgeon: [name], MD;  Location: [facility] ORS;  Service: Cardiovascular;  Laterality: N/A;  . CARDIOVERSION N/A 11/09/2019   Procedure: CARDIOVERSION;  Surgeon: Isaias Cowman, MD;  Location: [facility] ORS;  Service: Cardiovascular;  Laterality: N/A;  . CARDIOVERSION N/A 01/17/2020   Procedure: CARDIOVERSION;  Surgeon: Isaias Cowman, MD;  Location: [facility] ORS;  Service: Cardiovascular;  Laterality: N/A;  . CARPAL TUNNEL RELEASE    . ELECTROPHYSIOLOGIC STUDY N/A 08/17/2016   Procedure: Cardioversion;  Surgeon: [name], MD;  Location: [facility] ORS;  Service: Cardiovascular;  Laterality: N/A;  . TUMOR EXCISION Left    x3 (arm)    Prior to Admission medications   Medication Sig Start Date End Date Taking? Authorizing Provider  acetaminophen (TYLENOL) 500 MG tablet Take 500 mg by mouth every 4 (four) hours as needed for moderate pain or headache.     [provider]  albuterol (PROVENTIL HFA;VENTOLIN HFA) 108 (90 Base) MCG/ACT inhaler Inhale 2 puffs into the lungs every 6 (six) hours as needed for wheezing or shortness of breath. 11/29/18   Ronnell Freshwater, NP  ALPRAZolam (XANAX) 0.25 MG tablet Take 1 tablet (0.25 mg total) by mouth 2 (two) times daily as needed for anxiety. 12/19/19   Ronnell Freshwater, NP  atorvastatin (LIPITOR) 40 MG tablet 1 tab po qhs Patient taking differently: Take 40 mg by mouth at bedtime.  05/18/19   Ronnell Freshwater, NP  bismuth subsalicylate (PEPTO BISMOL) 262 MG/15ML suspension Take 30 mLs by mouth every 6 (six) hours as  needed for indigestion or diarrhea or loose stools.    [provider]  Calcium Carbonate-Vitamin D (CALCIUM 600+D PO) Take 1 tablet by mouth daily.    [provider]  Coenzyme Q10 (COQ10) 200 MG CAPS Take 200 mg by mouth daily.    [provider]  diclofenac sodium (VOLTAREN) 1 % GEL To the affected joint Patient taking differently: Apply 2-4 g topically 3 (three) times daily as needed (shoulder pain.). To the affected joint  09/17/17   Ronnell Freshwater, NP  diltiazem (CARDIZEM CD) 120 MG 24 hr capsule Take 1 capsule by mouth daily. Patient taking differently: Take 120 mg by mouth daily.  10/26/19   Minna Merritts, MD  docusate sodium (COLACE) 50 MG capsule Take 100 mg by mouth at bedtime.    [provider]  DULoxetine (CYMBALTA) 60 MG capsule Take 1 capsule (60 mg total) by mouth 2 (two) times daily. 10/10/19   Boscia, Greer Ee, NP  ELIQUIS 5 MG TABS tablet Take 1 tablet by mouth twice daily. Patient taking differently: Take 5 mg by mouth 2 (two) times daily.  10/26/19   [name], MD  ferrous sulfate (FERROUSUL) 325 (65 FE) MG tablet Take 650  mg by mouth daily with breakfast.     [provider]  flecainide (TAMBOCOR) 50 MG tablet Take 1 tablet (50 mg total) by mouth 2 (two) times daily. PLEASE CONTACT OFFICE [phone] TO SCHEDULE APPOINTMENT FOR FUTURE REFILLS 01/24/20   Deboraha Sprang, MD  Fluticasone-Umeclidin-Vilant (TRELEGY ELLIPTA) 100-62.5-25 MCG/INH AEPB Inhale 1 puff into the lungs daily. 12/26/19   Kendell Bane, NP  hydrALAZINE (APRESOLINE) 25 MG tablet Take 1 tablet (25 mg total) by mouth 3 (three) times daily. 09/27/19 01/11/20  Theora Gianotti, NP  Ketotifen Fumarate (ALLERGY EYE DROPS OP) Place 1 drop into both eyes daily as needed (allergies).    [provider]  levothyroxine (SYNTHROID) 25 MCG tablet Take 1 tablet (25 mcg total) by mouth daily before breakfast. 12/27/19   Ronnell Freshwater, NP    lisinopril-hydrochlorothiazide (ZESTORETIC) 20-25 MG tablet Take 1 tablet by mouth daily. 09/27/19   [name], MD  loratadine (CLARITIN) 10 MG tablet Take 10 mg by mouth daily.     [provider]  OXYGEN Inhale into the lungs.    [provider]  pantoprazole (PROTONIX) 40 MG tablet Take 1 tablet (40 mg total) by mouth daily. 12/27/19   Ronnell Freshwater, NP  potassium chloride (KLOR-CON) 10 MEQ tablet Take 2 tablets (20 mEq total) by mouth 2 (two) times daily. 12/15/19   Ronnell Freshwater, NP  sodium chloride (OCEAN) 0.65 % SOLN nasal spray Place 1 spray into both nostrils 2 (two) times daily.     [provider]  Tiotropium Bromide-Olodaterol (STIOLTO RESPIMAT) 2.5-2.5 MCG/ACT AERS Inhale 1 puff into the lungs daily. Patient not taking: Reported on 01/11/2020 08/25/19   Kendell Bane, NP  traMADol (ULTRAM) 50 MG tablet Take 50 mg by mouth daily as needed for moderate pain.  12/16/19   [provider]  traZODone (DESYREL) 50 MG tablet Take 1 tablet (50 mg total) by mouth at bedtime. 09/21/19   Ronnell Freshwater, NP  triamcinolone (KENALOG) 0.025 % cream Apply 1 application topically daily as needed (itching).  10/13/19   [provider]    Allergies Benadryl [diphenhydramine hcl (sleep)], Cetirizine, Lasix [furosemide], Levaquin [levofloxacin in d5w], Meloxicam, Soy allergy, and Sulfa antibiotics  Family History  Problem Relation Age of Onset  . Stroke Father   . Stroke Paternal Grandmother     Social History Social History   Tobacco Use  . Smoking status: Former Smoker    Types: Cigarettes  . Smokeless tobacco: Never Used  Substance Use Topics  . Alcohol use: No  . Drug use: No    Review of Systems  Constitutional: Negative for fever. Eyes: Negative for visual changes. ENT: Negative for sore throat. Neck: No neck pain  Cardiovascular: Negative for chest pain. Respiratory: Negative for shortness of breath. Gastrointestinal:  Negative for abdominal pain, vomiting or diarrhea. Genitourinary: Negative for dysuria. Musculoskeletal: Negative for back pain. + R foot pain Skin: Negative for rash. Neurological: Negative for headaches, weakness or numbness. Psych: No SI or HI  ____________________________________________   PHYSICAL EXAM:  VITAL SIGNS: ED Triage Vitals  Enc Vitals Group     BP 02/13/20 2258 (!) 150/134     Pulse Rate 02/13/20 2258 89     Resp 02/13/20 2258 16     Temp 02/13/20 2258 97.8 F (36.6 C)     Temp Source 02/13/20 2258 Oral     SpO2 02/13/20 2258 96 %     Weight 02/13/20 2259 186  lb (84.4 kg)     Height --      Head Circumference --      Peak Flow --      Pain Score 02/13/20 2259 7     Pain Loc --      Pain Edu? --      Excl. in [place]? --     Constitutional: Alert and oriented. Well appearing and in no apparent distress. HEENT:      Head: Normocephalic and atraumatic.         Eyes: Conjunctivae are normal. Sclera is non-icteric.       Mouth/Throat: Mucous membranes are moist.       Neck: Supple with no signs of meningismus. Cardiovascular: Regular rate and rhythm.  Respiratory: Normal respiratory effort.  Musculoskeletal: There is swelling and bruising over the 4th and 5th metatarsal areas with tenderness to palpation.  No tenderness to palpation of bilateral malleoli, tib-fib, knee, hip, femur.  Full painless range of motion of all joints. Neurologic: Normal speech and language. Face is symmetric. Moving all extremities. No gross focal neurologic deficits are appreciated. Skin: Skin is warm, dry and intact. No rash noted. Psychiatric: Mood and affect are normal. Speech and behavior are normal.  ____________________________________________   LABS (all labs ordered are listed, but only abnormal results are displayed)  Labs Reviewed - No data to display ____________________________________________  EKG  none  ____________________________________________  RADIOLOGY  I  have personally reviewed the images performed during this visit and I agree with the Radiologist's read.   Interpretation by Radiologist:  DG Foot Complete Right  Result Date: 02/13/2020 CLINICAL DATA:  Status post trauma. EXAM: RIGHT FOOT COMPLETE - 3+ VIEW COMPARISON:  None. FINDINGS: There is no evidence of fracture or dislocation. Mild degenerative changes seen along the dorsal aspect of the mid right foot. Mild soft tissue swelling is seen along the dorsal aspect of the proximal to mid right foot. IMPRESSION: Mild dorsal soft tissue swelling without evidence of acute fracture. Electronically Signed   By: Virgina Norfolk M.D.   On: 02/13/2020 23:23     ____________________________________________   PROCEDURES  Procedure(s) performed: None Procedures Critical Care performed:  None ____________________________________________   INITIAL IMPRESSION / ASSESSMENT AND PLAN / ED COURSE   74 y.o. female who presents for evaluation of traumatic right foot pain.  Patient has bruising over the fourth and fifth metatarsal on the dorsal aspect of the foot with no lacerations or obvious deformities.  X-ray showing no dislocation or fracture which was visualized by me and confirmed by radiology.  Will place patient on a hard sole shoe and give p.o. Tylenol and oxycodone for pain.  Patient lives with her adult granddaughter and walks with a walker.  Will ambulate after pain control for disposition.  Old medical records reviewed  _________________________ 6:33 AM on 02/14/2020 -----------------------------------------  Patient able to ambulate without assistance.  She has a walker at home.  She also has pain pills at home.  Recommended follow-up with PCP.  Discussed my standard return precautions       _____________________________________________ Please note:  Patient was evaluated in Emergency Department today for the symptoms described in the history of present illness. Patient was evaluated  in the context of the global COVID-19 pandemic, which necessitated consideration that the patient might be at risk for infection with the SARS-CoV-2 virus that causes COVID-19. Institutional protocols and algorithms that pertain to the evaluation of patients at risk for COVID-19 are in a state of  rapid change based on information released by regulatory bodies including the CDC and federal and state organizations. These policies and algorithms were followed during the patient's care in the ED.  Some ED evaluations and interventions may be delayed as a result of limited staffing during the pandemic.   [place] Controlled Substance Database was reviewed by me. ____________________________________________   FINAL CLINICAL IMPRESSION(S) / ED DIAGNOSES   Final diagnoses:  Contusion of right foot, initial encounter      NEW MEDICATIONS STARTED DURING THIS VISIT:  ED Discharge Orders    None       Note:  This document was prepared using Dragon voice recognition software and may include unintentional dictation errors.    Alfred Levins, [place], MD 02/14/20 [phone]

## 2020-02-14 NOTE — ED Notes (Signed)
Post op shoe applied and pain meds given. Walker taken to room for pt to use to try and ambulate after pain meds take affect.

## 2020-02-14 NOTE — ED Notes (Signed)
Pt able to ambulate with walker. Dr Alfred Levins made aware.

## 2020-02-19 ENCOUNTER — Telehealth: Payer: Self-pay

## 2020-02-19 NOTE — Telephone Encounter (Signed)
Confirmed and screened for 02-21-2020 ov.

## 2020-02-21 ENCOUNTER — Ambulatory Visit: Payer: Medicare Other | Admitting: Nurse Practitioner

## 2020-02-21 ENCOUNTER — Other Ambulatory Visit: Payer: Self-pay

## 2020-02-21 ENCOUNTER — Encounter: Payer: Self-pay | Admitting: Nurse Practitioner

## 2020-02-21 VITALS — BP 169/81 | HR 94 | Temp 96.0°F | Resp 16 | Ht <= 58 in | Wt 184.6 lb

## 2020-02-21 DIAGNOSIS — I4819 Other persistent atrial fibrillation: Secondary | ICD-10-CM

## 2020-02-21 DIAGNOSIS — Z9981 Dependence on supplemental oxygen: Secondary | ICD-10-CM | POA: Diagnosis not present

## 2020-02-21 DIAGNOSIS — Z950 Presence of cardiac pacemaker: Secondary | ICD-10-CM

## 2020-02-21 DIAGNOSIS — J449 Chronic obstructive pulmonary disease, unspecified: Secondary | ICD-10-CM | POA: Diagnosis not present

## 2020-02-21 NOTE — Progress Notes (Deleted)
[facility] [place], [place] [address]  Internal MEDICINE  Office Visit Note  Patient Name: [name]  [id number]  [id number]  Date of Service: 02/21/2020     Chief Complaint  Patient presents with  . Hospitalization Follow-up  . COPD  . Hypertension  . Hyperlipidemia     HPI Pt is here for recent hospital follow up.  Current Medication: Outpatient Encounter Medications as of 02/21/2020  Medication Sig  . acetaminophen (TYLENOL) 500 MG tablet Take 500 mg by mouth every 4 (four) hours as needed for moderate pain or headache.   . albuterol (PROVENTIL HFA;VENTOLIN HFA) 108 (90 Base) MCG/ACT inhaler Inhale 2 puffs into the lungs every 6 (six) hours as needed for wheezing or shortness of breath.  . ALPRAZolam (XANAX) 0.25 MG tablet Take 1 tablet (0.25 mg total) by mouth 2 (two) times daily as needed for anxiety.  [name] atorvastatin (LIPITOR) 40 MG tablet 1 tab po qhs (Patient taking differently: Take 40 mg by mouth at bedtime. )  . bismuth subsalicylate (PEPTO BISMOL) 262 MG/15ML suspension Take 30 mLs by mouth every 6 (six) hours as needed for indigestion or diarrhea or loose stools.  . Calcium Carbonate-Vitamin D (CALCIUM 600+D PO) Take 1 tablet by mouth daily.  . Coenzyme Q10 (COQ10) 200 MG CAPS Take 200 mg by mouth daily.  . diclofenac sodium (VOLTAREN) 1 % GEL To the affected joint (Patient taking differently: Apply 2-4 g topically 3 (three) times daily as needed (shoulder pain.). To the affected joint )  . diltiazem (CARDIZEM CD) 120 MG 24 hr capsule Take 1 capsule by mouth daily. (Patient taking differently: Take 120 mg by mouth daily. )  . docusate sodium (COLACE) 50 MG capsule Take 100 mg by mouth at bedtime.  . DULoxetine (CYMBALTA) 60 MG capsule Take 1 capsule (60 mg total) by mouth 2 (two) times daily.  [name] ELIQUIS 5 MG TABS tablet Take 1 tablet by mouth twice daily. (Patient taking differently: Take 5 mg by mouth 2 (two) times daily. )  . ferrous  sulfate (FERROUSUL) 325 (65 FE) MG tablet Take 650 mg by mouth daily with breakfast.   . flecainide (TAMBOCOR) 50 MG tablet Take 1 tablet (50 mg total) by mouth 2 (two) times daily. PLEASE CONTACT OFFICE [phone] TO SCHEDULE APPOINTMENT FOR FUTURE REFILLS  . Fluticasone-Umeclidin-Vilant (TRELEGY ELLIPTA) 100-62.5-25 MCG/INH AEPB Inhale 1 puff into the lungs daily.  [name] Ketotifen Fumarate (ALLERGY EYE DROPS OP) Place 1 drop into both eyes daily as needed (allergies).  [name] levothyroxine (SYNTHROID) 25 MCG tablet Take 1 tablet (25 mcg total) by mouth daily before breakfast.  . lisinopril-hydrochlorothiazide (ZESTORETIC) 20-25 MG tablet Take 1 tablet by mouth daily.  [name] loratadine (CLARITIN) 10 MG tablet Take 10 mg by mouth daily.   . OXYGEN Inhale into the lungs.  . pantoprazole (PROTONIX) 40 MG tablet Take 1 tablet (40 mg total) by mouth daily.  . potassium chloride (KLOR-CON) 10 MEQ tablet Take 2 tablets (20 mEq total) by mouth 2 (two) times daily.  . sodium chloride (OCEAN) 0.65 % SOLN nasal spray Place 1 spray into both nostrils 2 (two) times daily.   . Tiotropium Bromide-Olodaterol (STIOLTO RESPIMAT) 2.5-2.5 MCG/ACT AERS Inhale 1 puff into the lungs daily.  . traMADol (ULTRAM) 50 MG tablet Take 50 mg by mouth daily as needed for moderate pain.   . traZODone (DESYREL) 50 MG tablet Take 1 tablet (50 mg total) by mouth at bedtime.  . triamcinolone (KENALOG) 0.025 %  cream Apply 1 application topically daily as needed (itching).   . hydrALAZINE (APRESOLINE) 25 MG tablet Take 1 tablet (25 mg total) by mouth 3 (three) times daily.   No facility-administered encounter medications on file as of 02/21/2020.    Surgical History: Past Surgical History:  Procedure Laterality Date  . ABDOMINAL AORTIC ANEURYSM REPAIR  2008  . ABDOMINAL HYSTERECTOMY    . AORTIC VALVE REPLACEMENT  2008  . CARDIAC CATHETERIZATION     [facility]  . CARDIOVERSION N/A 08/15/2018   Procedure: CARDIOVERSION;  Surgeon: [name], MD;  Location: [facility] ORS;  Service: Cardiovascular;  Laterality: N/A;  . CARDIOVERSION N/A 11/14/2018   Procedure: CARDIOVERSION (CATH LAB);  Surgeon: [name], MD;  Location: [facility] ORS;  Service: Cardiovascular;  Laterality: N/A;  . CARDIOVERSION N/A 06/05/2019   Procedure: CARDIOVERSION;  Surgeon: [name], MD;  Location: [facility] ORS;  Service: Cardiovascular;  Laterality: N/A;  . CARDIOVERSION N/A 08/14/2019   Procedure: CARDIOVERSION;  Surgeon: [name], MD;  Location: [facility] ORS;  Service: Cardiovascular;  Laterality: N/A;  . CARDIOVERSION N/A 11/09/2019   Procedure: CARDIOVERSION;  Surgeon: Isaias Cowman, MD;  Location: [facility] ORS;  Service: Cardiovascular;  Laterality: N/A;  . CARDIOVERSION N/A 01/17/2020   Procedure: CARDIOVERSION;  Surgeon: Isaias Cowman, MD;  Location: [facility] ORS;  Service: Cardiovascular;  Laterality: N/A;  . CARPAL TUNNEL RELEASE    . ELECTROPHYSIOLOGIC STUDY N/A 08/17/2016   Procedure: Cardioversion;  Surgeon: [name], MD;  Location: [facility] ORS;  Service: Cardiovascular;  Laterality: N/A;  . TUMOR EXCISION Left    x3 (arm)    Medical History: Past Medical History:  Diagnosis Date  . 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 s/p bioprosthetic valve replacement 2008 at [facility];  b. 01/2015 Echo: EF 60-65%, no rwma, Gr1 DD, mildly dil LA, nl RV fxn.  . Atrial fibrillation with RVR ([place]) 01/11/2015  . Atrial flutter ([place])    a. 08/2016 s/p DCCV.  Remains on flecainide 50 mg bid.  . Basal skull fracture ([place]) 20 yrs ago  . Chronic respiratory failure ([place])   . COPD (chronic obstructive pulmonary disease) ([place])    a. on home O2 at 2L since 2008  . 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 Essential hypertension 01/24/2015  . History of cardiac cath    a. 2008 prior to Aortic aneurysm repair-->nl cors.  . History of stress test    a. 10/2015 MV: no ischemia/infarct.  [name] HLD (hyperlipidemia)   . HTN  (hypertension)   . Hypothyroidism   . Obesity   . PAF (paroxysmal atrial fibrillation) ([name])    a. on Eliquis; b. CHADS2VASc = 3 (HTN, age x 1, female).  . Paroxysmal atrial fibrillation ([place]) 01/24/2015  . Right upper quadrant abdominal tenderness without rebound tenderness 03/02/2018  . S/P ascending aortic aneurysm repair 2008    Family History: Family History  Problem Relation Age of Onset  . Stroke Father   . Stroke Paternal Grandmother     Social History   Socioeconomic History  . Marital status: Single    Spouse name: Not on file  . Number of children: Not on file  . Years of education: Not on file  . Highest education level: Not on file  Occupational History  . Not on file  Tobacco Use  . Smoking status: Former Smoker    Types: Cigarettes  . Smokeless tobacco:  Never Used  Vaping Use  . Vaping Use: Never used  Substance and Sexual Activity  . Alcohol use: No  . Drug use: No  . Sexual activity: Never    Birth control/protection: Surgical  Other Topics Concern  . Not on file  Social History Narrative  . Not on file   Social Determinants of Health   Financial Resource Strain:   . Difficulty of Paying Living Expenses:   Food Insecurity:   . Worried About Charity fundraiser in the Last Year:   . Arboriculturist in the Last Year:   Transportation Needs:   . Film/video editor (Medical):   [name] Lack of Transportation (Non-Medical):   Physical Activity:   . Days of Exercise per Week:   . Minutes of Exercise per Session:   Stress:   . Feeling of Stress :   Social Connections:   . Frequency of Communication with Friends and Family:   . Frequency of Social Gatherings with Friends and Family:   . Attends Religious Services:   . Active Member of Clubs or Organizations:   . Attends Archivist Meetings:   [name] Marital Status:   Intimate Partner Violence:   . Fear of Current or Ex-Partner:   . Emotionally Abused:   [name] Physically Abused:   . Sexually  Abused:       Review of Systems  Vital Signs: BP (!) 169/81   Pulse 94   Temp (!) 96 F (35.6 C)   Resp 16   Ht 4\' 10"  (1.473 m)   Wt 184 lb 9.6 oz (83.7 kg)   SpO2 92%   BMI 38.58 kg/m    Physical Exam    Assessment/Plan:   General Counseling: Juni verbalizes understanding of the findings of todays visit and agrees with plan of treatment. I have discussed any further diagnostic evaluation that may be needed or ordered today. We also reviewed her medications today. she has been encouraged to call the office with any questions or concerns that should arise related to todays visit.    Counseling:    No orders of the defined types were placed in this encounter.     I have reviewed all medical records from hospital follow up including radiology reports and consults from other physicians. Appropriate follow up diagnostics will be scheduled as needed. Patient/ Family understands the plan of treatment. Time spent*** minutes.   Dr [name], MD Internal Medicine

## 2020-02-21 NOTE — Progress Notes (Signed)
[facility], [place] [address]  Internal MEDICINE  Office Visit Note  Patient Name: [name]  [id number]  [id number]  Date of Service: 02/21/2020  Chief Complaint  Patient presents with   Hospitalization Follow-up   COPD   Hypertension   Hyperlipidemia    The patient is here for follow up. She had cardioversion and pacemaker placement on 02/07/2020 at [facility]. She has steri strips over incision in left upper chest which are still intact. She had had some pain in pacemaker insertion site. Today, she is slightly tender at the site. She continues to see her cardiologist and EP provider regarding pacemaker.  On 02/14/2020, she twisted her right ankle and was seen in ER. The x-ray did show some soft tissue swelling, however, there was no break or fracture. She states that pain is gradually getting better. She can flex and extend her foot. She can also roll the ankle. She is able to place some weight on the foot, but this ability is limited.       Current Medication: Outpatient Encounter Medications as of 02/21/2020  Medication Sig   acetaminophen (TYLENOL) 500 MG tablet Take 500 mg by mouth every 4 (four) hours as needed for moderate pain or headache.    albuterol (PROVENTIL HFA;VENTOLIN HFA) 108 (90 Base) MCG/ACT inhaler Inhale 2 puffs into the lungs every 6 (six) hours as needed for wheezing or shortness of breath.   ALPRAZolam (XANAX) 0.25 MG tablet Take 1 tablet (0.25 mg total) by mouth 2 (two) times daily as needed for anxiety.   atorvastatin (LIPITOR) 40 MG tablet 1 tab po qhs (Patient taking differently: Take 40 mg by mouth at bedtime. )   bismuth subsalicylate (PEPTO BISMOL) 262 MG/15ML suspension Take 30 mLs by mouth every 6 (six) hours as needed for indigestion or diarrhea or loose stools.   Calcium Carbonate-Vitamin D (CALCIUM 600+D PO) Take 1 tablet by mouth daily.   Coenzyme Q10 (COQ10) 200 MG CAPS Take 200 mg by  mouth daily.   diclofenac sodium (VOLTAREN) 1 % GEL To the affected joint (Patient taking differently: Apply 2-4 g topically 3 (three) times daily as needed (shoulder pain.). To the affected joint )   diltiazem (CARDIZEM CD) 120 MG 24 hr capsule Take 1 capsule by mouth daily. (Patient taking differently: Take 120 mg by mouth daily. )   docusate sodium (COLACE) 50 MG capsule Take 100 mg by mouth at bedtime.   DULoxetine (CYMBALTA) 60 MG capsule Take 1 capsule (60 mg total) by mouth 2 (two) times daily.   ELIQUIS 5 MG TABS tablet Take 1 tablet by mouth twice daily. (Patient taking differently: Take 5 mg by mouth 2 (two) times daily. )   ferrous sulfate (FERROUSUL) 325 (65 FE) MG tablet Take 650 mg by mouth daily with breakfast.    flecainide (TAMBOCOR) 50 MG tablet Take 1 tablet (50 mg total) by mouth 2 (two) times daily. PLEASE CONTACT OFFICE [phone] TO SCHEDULE APPOINTMENT FOR FUTURE REFILLS   Fluticasone-Umeclidin-Vilant (TRELEGY ELLIPTA) 100-62.5-25 MCG/INH AEPB Inhale 1 puff into the lungs daily.   Ketotifen Fumarate (ALLERGY EYE DROPS OP) Place 1 drop into both eyes daily as needed (allergies).   levothyroxine (SYNTHROID) 25 MCG tablet Take 1 tablet (25 mcg total) by mouth daily before breakfast.   lisinopril-hydrochlorothiazide (ZESTORETIC) 20-25 MG tablet Take 1 tablet by mouth daily.   loratadine (CLARITIN) 10 MG tablet Take 10 mg by mouth daily.    OXYGEN Inhale  into the lungs.   pantoprazole (PROTONIX) 40 MG tablet Take 1 tablet (40 mg total) by mouth daily.   potassium chloride (KLOR-CON) 10 MEQ tablet Take 2 tablets (20 mEq total) by mouth 2 (two) times daily.   sodium chloride (OCEAN) 0.65 % SOLN nasal spray Place 1 spray into both nostrils 2 (two) times daily.    Tiotropium Bromide-Olodaterol (STIOLTO RESPIMAT) 2.5-2.5 MCG/ACT AERS Inhale 1 puff into the lungs daily.   traMADol (ULTRAM) 50 MG tablet Take 50 mg by mouth daily as needed for moderate pain.     traZODone (DESYREL) 50 MG tablet Take 1 tablet (50 mg total) by mouth at bedtime.   triamcinolone (KENALOG) 0.025 % cream Apply 1 application topically daily as needed (itching).    hydrALAZINE (APRESOLINE) 25 MG tablet Take 1 tablet (25 mg total) by mouth 3 (three) times daily.   No facility-administered encounter medications on file as of 02/21/2020.    Surgical History: Past Surgical History:  Procedure Laterality Date   ABDOMINAL AORTIC ANEURYSM REPAIR  2008   ABDOMINAL HYSTERECTOMY     AORTIC VALVE REPLACEMENT  2008   CARDIAC CATHETERIZATION     [place]   CARDIOVERSION N/A 08/15/2018   Procedure: CARDIOVERSION;  Surgeon: [name], MD;  Location: [facility] OR[facility];  Service: Cardiovascular;  Laterality: N/A;   CARDIOVERSION N/A 11/14/2018   Procedure: CARDIOVERSION (CATH LAB);  Surgeon: [name], MD;  Location: [facility];  Service: Cardiovascular;  Laterality: N/A;   CARDIOVERSION N/A 06/05/2019   Procedure: CARDIOVERSION;  Surgeon: [name], MD;  Location: [facility];  Service: Cardiovascular;  Laterality: N/A;   CARDIOVERSION N/A 08/14/2019   Procedure: CARDIOVERSION;  Surgeon: [name], MD;  Location: [facility];  Service: Cardiovascular;  Laterality: N/A;   CARDIOVERSION N/A 11/09/2019   Procedure: CARDIOVERSION;  Surgeon: Isaias Cowman, MD;  Location: [facility];  Service: Cardiovascular;  Laterality: N/A;   CARDIOVERSION N/A 01/17/2020   Procedure: CARDIOVERSION;  Surgeon: Isaias Cowman, MD;  Location: [facility];  Service: Cardiovascular;  Laterality: N/A;   CARPAL TUNNEL RELEASE     ELECTROPHYSIOLOGIC STUDY N/A 08/17/2016   Procedure: Cardioversion;  Surgeon: [name], MD;  Location: [facility];  Service: Cardiovascular;  Laterality: N/A;   TUMOR EXCISION Left    x3 (arm)    Medical History: Past Medical History:  Diagnosis Date   Aortic 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 s/p bioprosthetic valve replacement 2008 at  [facility];  b. 01/2015 Echo: EF 60-65%, no rwma, Gr1 DD, mildly dil LA, nl RV fxn.   Atrial fibrillation with RVR ([place]) 01/11/2015   Atrial flutter ([place])    a. 08/2016 s/p DCCV.  Remains on flecainide 50 mg bid.   Basal skull fracture ([name]) 20 yrs ago   Chronic respiratory failure ([name])    COPD (chronic obstructive pulmonary disease) ([place])    a. 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 2008 prior to Aortic aneurysm repair-->nl cors.   History of stress test    a. 10/2015 MV: no ischemia/infarct.   HLD (hyperlipidemia)    HTN (hypertension)    Hypothyroidism    Obesity    PAF (paroxysmal atrial fibrillation) ([name])    a. on Eliquis; b. CHADS2VASc = 3 (HTN, age x 30, female).   Paroxysmal atrial  fibrillation ([place]) 01/24/2015   Right upper quadrant abdominal tenderness without rebound tenderness 03/02/2018   S/P ascending aortic aneurysm repair 2008    Family History: Family History  Problem Relation Age of Onset   Stroke Father    Stroke Paternal Grandmother     Social History   Socioeconomic History   Marital status: Single    Spouse name: Not on file   Number of children: Not on file   Years of education: Not on file   Highest education level: Not on file  Occupational History   Not on file  Tobacco Use   Smoking status: Former Smoker    Types: Cigarettes   Smokeless tobacco: Never Used  Scientific laboratory technician Use: Never used  Substance and Sexual Activity   Alcohol use: No   Drug use: No   Sexual activity: Never    Birth control/protection: Surgical  Other Topics Concern   Not on file  Social History Narrative   Not on file   Social Determinants of Health   Financial Resource Strain:    Difficulty of Paying Living Expenses:   Food Insecurity:    Worried About Charity fundraiser in the Last Year:    Arboriculturist in  the Last Year:   Transportation Needs:    Film/video editor (Medical):    Lack of Transportation (Non-Medical):   Physical Activity:    Days of Exercise per Week:    Minutes of Exercise per Session:   Stress:    Feeling of Stress :   Social Connections:    Frequency of Communication with Friends and Family:    Frequency of Social Gatherings with Friends and Family:    Attends Religious Services:    Active Member of Clubs or Organizations:    Attends Music therapist:    Marital Status:   Intimate Partner Violence:    Fear of Current or Ex-Partner:    Emotionally Abused:    Physically Abused:    Sexually Abused:       Review of Systems  Constitutional: Positive for fatigue. Negative for activity change, chills and unexpected weight change.  HENT: Negative for congestion, postnasal drip, rhinorrhea, sneezing and sore throat.   Respiratory: Positive for shortness of breath. Negative for cough, chest tightness and wheezing.   Cardiovascular: Negative for chest pain and palpitations.       Recently had placement of pacemaker. Doing well after procedure.   Gastrointestinal: Negative for abdominal pain, constipation, diarrhea, nausea and vomiting.  Endocrine: Negative for cold intolerance, heat intolerance, polydipsia and polyuria.  Musculoskeletal: Negative for arthralgias, back pain, joint swelling and neck pain.  Skin: Negative for rash.  Allergic/Immunologic: Negative for environmental allergies.  Neurological: Negative for dizziness, tremors, numbness and headaches.  Hematological: Negative for adenopathy. Does not bruise/bleed easily.  Psychiatric/Behavioral: Negative for behavioral problems (Depression), dysphoric mood, sleep disturbance and suicidal ideas. The patient is nervous/anxious.     Today's Vitals   02/21/20 1134  BP: (!) 169/81  Pulse: 94  Resp: 16  Temp: (!) 96 F (35.6 C)  SpO2: 92%  Weight: 184 lb 9.6 oz (83.7 kg)   Height: 4\' 10"  (1.473 m)   Body mass index is 38.58 kg/m.  Physical Exam Vitals and nursing note reviewed.  Constitutional:      Appearance: Normal appearance. She is well-developed.  HENT:     Head: Normocephalic and atraumatic.     Nose: Nose normal.  Mouth/Throat:     Pharynx: No oropharyngeal exudate.  Eyes:     Extraocular Movements: Extraocular movements intact.     Conjunctiva/sclera: Conjunctivae normal.     Pupils: Pupils are equal, round, and reactive to light.  Neck:     Thyroid: No thyromegaly.     Vascular: No JVD.     Trachea: No tracheal deviation.  Cardiovascular:     Rate and Rhythm: Normal rate. Rhythm irregular.     Heart sounds: Murmur heard.      Comments: patient has new pacemaker in left upper chest.  Pulmonary:     Effort: Pulmonary effort is normal.     Breath sounds: Normal breath sounds. No rhonchi or rales.  Abdominal:     General: Bowel sounds are normal.     Palpations: Abdomen is soft.     Tenderness: There is no abdominal tenderness.  Musculoskeletal:        General: Normal range of motion.     Cervical back: Normal range of motion and neck supple.  Lymphadenopathy:     Cervical: No cervical adenopathy.  Skin:    General: Skin is warm and dry.     Comments: Insertion site for cardiac pacemaker in left upper chest. It is covered with intact steri strips. There is mild swelling present. There is no redness, warmth, or tenderness present with palpation.   Neurological:     Mental Status: She is alert and oriented to person, place, and time.  Psychiatric:        Mood and Affect: Mood is anxious and depressed.        Speech: Speech normal.        Behavior: Behavior normal.        Thought Content: Thought content normal.        Judgment: Judgment normal.   Assessment/Plan: 1. Persistent atrial fibrillation [facility]) Patient had cardioversion and placement of pacemaker 02/07/2020. She continues to see cardiology and EP provider.   2. S/P  placement of cardiac pacemaker Pacemaker placed 02/07/2020. Insertion site covered with intact steri strips. No evidence of infection or inflammation at this time. Continue to monitor closely.   3. 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 exacerbation ([place]) Continue inhalers and respiratory medication as prescribed   4. Oxygen dependent conitnue with supplemental oxygen as prescribed   General Counseling: Mikalia verbalizes understanding of the findings of todays visit and agrees with plan of treatment. I have discussed any further diagnostic evaluation that may be needed or ordered today. We also reviewed her medications today. she has been encouraged to call the office with any questions or concerns that should arise related to todays visit.   This patient was seen by Leretha Pol FNP Collaboration with Dr [name] as a part of collaborative care agreement  Total time spent: 35 Minutes   Time spent includes review of chart, medications, test results, and follow up plan with the patient.      Dr [name] Internal medicine

## 2020-02-22 ENCOUNTER — Telehealth: Payer: Self-pay

## 2020-02-22 NOTE — Telephone Encounter (Signed)
Left a message and asked pt to call and schedule hospital follow up. [name]

## 2020-02-23 ENCOUNTER — Other Ambulatory Visit: Payer: Self-pay

## 2020-02-23 DIAGNOSIS — F5101 Primary insomnia: Secondary | ICD-10-CM

## 2020-02-23 MED ORDER — FLECAINIDE ACETATE 50 MG PO TABS: 50.0000 mg | ORAL_TABLET | Freq: Two times a day (BID) | ORAL | 0 refills | Status: DC

## 2020-02-23 MED ORDER — TRAZODONE HCL 50 MG PO TABS: 50.0000 mg | ORAL_TABLET | Freq: Every day | ORAL | 1 refills | Status: DC

## 2020-02-23 MED ORDER — ATORVASTATIN CALCIUM 40 MG PO TABS: ORAL_TABLET | ORAL | 1 refills | Status: DC

## 2020-02-28 ENCOUNTER — Telehealth: Payer: Self-pay | Admitting: Internal Medicine

## 2020-02-28 NOTE — Telephone Encounter (Signed)
Patient changed providers to kc .  Deleting recalls.

## 2020-02-28 NOTE — Telephone Encounter (Signed)
Noted  

## 2020-03-02 DIAGNOSIS — J449 Chronic obstructive pulmonary disease, unspecified: Secondary | ICD-10-CM | POA: Diagnosis not present

## 2020-03-12 ENCOUNTER — Telehealth: Payer: Self-pay

## 2020-03-12 NOTE — Telephone Encounter (Signed)
Left message confirming PFT appt

## 2020-03-13 ENCOUNTER — Other Ambulatory Visit: Payer: Self-pay

## 2020-03-13 ENCOUNTER — Ambulatory Visit (INDEPENDENT_AMBULATORY_CARE_PROVIDER_SITE_OTHER): Payer: Medicare Other | Admitting: Internal Medicine

## 2020-03-13 DIAGNOSIS — R0602 Shortness of breath: Secondary | ICD-10-CM

## 2020-03-13 DIAGNOSIS — J449 Chronic obstructive pulmonary disease, unspecified: Secondary | ICD-10-CM

## 2020-03-13 LAB — PULMONARY FUNCTION TEST

## 2020-03-14 ENCOUNTER — Other Ambulatory Visit: Payer: Self-pay

## 2020-03-14 MED ORDER — ATORVASTATIN CALCIUM 40 MG PO TABS: ORAL_TABLET | ORAL | 1 refills | Status: DC

## 2020-03-14 NOTE — Procedures (Signed)
[place], [address]  DATE OF SERVICE: March 13, 2020  Complete Pulmonary Function Testing Interpretation:  FINDINGS:  The forced vital capacity is mildly decreased.  FEV1 is 1.17 L which is 68% of predicted and is mildly decreased.  F1 FVC ratio is mildly decreased.  Total lung capacity is increased residual volume is increased residual in total lung capacity ratio is increased.  FRC is increased.  Postbronchodilator no significant change in FEV1 clinical improvement may still occur.  Patient was not able to perform DLCO measurements  IMPRESSION:  This pulmonary function study is suggestive of mild obstructive lung disease.  Patient has no significant response to bronchodilators.  Patient was not able to perform DLCO.  Allyne Gee, MD [facility] Pulmonary Critical Care Medicine Sleep Medicine

## 2020-03-25 ENCOUNTER — Other Ambulatory Visit: Payer: Self-pay

## 2020-03-25 DIAGNOSIS — F321 Major depressive disorder, single episode, moderate: Secondary | ICD-10-CM

## 2020-03-25 DIAGNOSIS — M19011 Primary osteoarthritis, right shoulder: Secondary | ICD-10-CM | POA: Diagnosis not present

## 2020-03-25 DIAGNOSIS — M19012 Primary osteoarthritis, left shoulder: Secondary | ICD-10-CM | POA: Diagnosis not present

## 2020-03-25 MED ORDER — DULOXETINE HCL 60 MG PO CPEP: 60.0000 mg | ORAL_CAPSULE | Freq: Two times a day (BID) | ORAL | 1 refills | Status: DC

## 2020-03-29 ENCOUNTER — Emergency Department: Payer: Medicare Other

## 2020-03-29 ENCOUNTER — Telehealth: Payer: Self-pay

## 2020-03-29 ENCOUNTER — Emergency Department
Admission: EM | Admit: 2020-03-29 | Discharge: 2020-03-30 | Disposition: A | Discharge status: Home / Self Care | Payer: Medicare Other | Attending: Emergency Medicine | Admitting: Emergency Medicine

## 2020-03-29 ENCOUNTER — Encounter: Payer: Self-pay | Admitting: Emergency Medicine

## 2020-03-29 ENCOUNTER — Other Ambulatory Visit: Payer: Self-pay

## 2020-03-29 DIAGNOSIS — R0789 Other chest pain: Secondary | ICD-10-CM | POA: Diagnosis not present

## 2020-03-29 DIAGNOSIS — Z87891 Personal history of nicotine dependence: Secondary | ICD-10-CM | POA: Insufficient documentation

## 2020-03-29 DIAGNOSIS — Z8673 Personal history of transient ischemic attack (TIA), and cerebral infarction without residual deficits: Secondary | ICD-10-CM | POA: Diagnosis not present

## 2020-03-29 DIAGNOSIS — Z853 Personal history of malignant neoplasm of breast: Secondary | ICD-10-CM | POA: Diagnosis not present

## 2020-03-29 DIAGNOSIS — J441 Chronic obstructive pulmonary disease with (acute) exacerbation: Secondary | ICD-10-CM

## 2020-03-29 DIAGNOSIS — E039 Hypothyroidism, unspecified: Secondary | ICD-10-CM | POA: Diagnosis not present

## 2020-03-29 DIAGNOSIS — I4891 Unspecified atrial fibrillation: Secondary | ICD-10-CM | POA: Insufficient documentation

## 2020-03-29 DIAGNOSIS — R0602 Shortness of breath: Secondary | ICD-10-CM | POA: Diagnosis not present

## 2020-03-29 DIAGNOSIS — I10 Essential (primary) hypertension: Secondary | ICD-10-CM | POA: Diagnosis not present

## 2020-03-29 DIAGNOSIS — Z79890 Hormone replacement therapy (postmenopausal): Secondary | ICD-10-CM | POA: Insufficient documentation

## 2020-03-29 DIAGNOSIS — Z7901 Long term (current) use of anticoagulants: Secondary | ICD-10-CM | POA: Insufficient documentation

## 2020-03-29 DIAGNOSIS — R069 Unspecified abnormalities of breathing: Secondary | ICD-10-CM | POA: Diagnosis not present

## 2020-03-29 DIAGNOSIS — J90 Pleural effusion, not elsewhere classified: Secondary | ICD-10-CM | POA: Diagnosis not present

## 2020-03-29 DIAGNOSIS — Z7951 Long term (current) use of inhaled steroids: Secondary | ICD-10-CM | POA: Insufficient documentation

## 2020-03-29 DIAGNOSIS — Z7952 Long term (current) use of systemic steroids: Secondary | ICD-10-CM | POA: Diagnosis not present

## 2020-03-29 DIAGNOSIS — Z79899 Other long term (current) drug therapy: Secondary | ICD-10-CM | POA: Diagnosis not present

## 2020-03-29 LAB — BASIC METABOLIC PANEL
Anion gap: 9 (ref 5–15)
BUN: 20 mg/dL (ref 8–23)
CO2: 28 mmol/L (ref 22–32)
Calcium: 9.2 mg/dL (ref 8.9–10.3)
Chloride: 98 mmol/L (ref 98–111)
Creatinine, Ser: 0.75 mg/dL (ref 0.44–1.00)
GFR calc Af Amer: >60 mL/min (ref >60)
GFR calc non Af Amer: >60 mL/min (ref >60)
Glucose, Bld: 123 mg/dL — ABNORMAL HIGH (ref 70–99)
Potassium: 3.4 mmol/L — ABNORMAL LOW (ref 3.5–5.1)
Sodium: 135 mmol/L (ref 135–145)

## 2020-03-29 LAB — CBC
HCT: 33.5 % — ABNORMAL LOW (ref 36.0–46.0)
Hemoglobin: 10.4 g/dL — ABNORMAL LOW (ref 12.0–15.0)
MCH: 25.6 pg — ABNORMAL LOW (ref 26.0–34.0)
MCHC: 31 g/dL (ref 30.0–36.0)
MCV: 82.5 fL (ref 80.0–100.0)
Platelets: 374 10*3/uL (ref 150–400)
RBC: 4.06 MIL/uL (ref 3.87–5.11)
RDW: 18.2 % — ABNORMAL HIGH (ref 11.5–15.5)
WBC: 8 10*3/uL (ref 4.0–10.5)
nRBC: 0 % (ref 0.0–0.2)

## 2020-03-29 LAB — TROPONIN I (HIGH SENSITIVITY)
Troponin I (High Sensitivity): 19 ng/L — ABNORMAL HIGH (ref <18)
Troponin I (High Sensitivity): 22 ng/L — ABNORMAL HIGH (ref <18)

## 2020-03-29 MED ORDER — AZITHROMYCIN 250 MG PO TABS: ORAL_TABLET | ORAL | 0 refills | Status: AC

## 2020-03-29 MED ORDER — IPRATROPIUM-ALBUTEROL 0.5-2.5 (3) MG/3ML IN SOLN
3.0000 mL | Freq: Once | RESPIRATORY_TRACT | Status: AC
  Administered 2020-03-29: 3 mL via RESPIRATORY_TRACT
  Filled 2020-03-29: qty 3

## 2020-03-29 MED ORDER — PREDNISONE 20 MG PO TABS: 60.0000 mg | ORAL_TABLET | Freq: Every day | ORAL | 0 refills | Status: AC

## 2020-03-29 MED ORDER — PREDNISONE 20 MG PO TABS
60.0000 mg | ORAL_TABLET | Freq: Once | ORAL | Status: AC
  Administered 2020-03-29: 60 mg via ORAL
  Filled 2020-03-29: qty 3

## 2020-03-29 NOTE — Discharge Instructions (Signed)
Take the prednisone as prescribed for the next 4 days after the ER visit.  You should also take the azithromycin (antibiotic) for the next 5 days as prescribed.  Follow-up with Dr. Humphrey Rolls next week as scheduled.  In the meantime, return to the ER for new, worsening, or persistent severe shortness of breath, cough, fever, weakness, chest pain, or any other new or worsening symptoms that concern you

## 2020-03-29 NOTE — Telephone Encounter (Signed)
Agreed. Has she called for EMTs yet?

## 2020-03-29 NOTE — Telephone Encounter (Signed)
Yes, she needs to be evaluated in ED asap

## 2020-03-29 NOTE — ED Triage Notes (Addendum)
Pulled to triage.  Pt on phone at this time and unable to answer triage questions. Will triage as soon as able.

## 2020-03-29 NOTE — ED Triage Notes (Signed)
Pt here for [facility] and [name].  Gets chest tightness with exertion but none at this time. Unlabored currently. VSS. Wears 3 L [place] at baseline.  Has had cough. No fever.

## 2020-03-29 NOTE — Telephone Encounter (Signed)
Yes she has, just be looking for hospital notes

## 2020-03-29 NOTE — ED Triage Notes (Signed)
Pt in via EMS with c/o exertional SOB  166/76, 96% 4L, 22 RR

## 2020-03-29 NOTE — ED Provider Notes (Signed)
[facility] Department Provider Note ____________________________________________   First MD Initiated Contact with Patient 03/29/20 2134     (approximate)  I have reviewed the triage vital signs and the nursing notes.   HISTORY  Chief Complaint Shortness of Breath    HPI [name] is a 74 y.o. female with PMH as noted below including COPD on 3 L O2 at home who presents with worsening shortness of breath over approximately the last week, especially worse with exertion, associated with feeling somewhat shaky.  She reports a nonproductive cough but no fever.  She denies any chest pain.  The patient also denies leg swelling.  She states that she is taking her normal inhalers without relief.  She has a follow-up with her pulmonologist next week.  Past Medical History:  Diagnosis Date  . 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 s/p bioprosthetic valve replacement 2008 at [facility];  b. 01/2015 Echo: EF 60-65%, no rwma, Gr1 DD, mildly dil LA, nl RV fxn.  . Atrial fibrillation with RVR ([place]) 01/11/2015  . Atrial flutter ([place])    a. 08/2016 s/p DCCV.  Remains on flecainide 50 mg bid.  . Basal skull fracture ([place]) 20 yrs ago  . Chronic respiratory failure ([place])   . COPD (chronic obstructive pulmonary disease) ([place])    a. on home O2 at 2L since 2008  . 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 Essential hypertension 01/24/2015  . History of cardiac cath    a. 2008 prior to Aortic aneurysm repair-->nl cors.  . History of stress test    a. 10/2015 MV: no ischemia/infarct.  [name] HLD (hyperlipidemia)   . HTN (hypertension)   . Hypothyroidism   . Obesity   . PAF (paroxysmal atrial fibrillation) ([name])    a. on Eliquis; b. CHADS2VASc = 3 (HTN, age x 1, female).  . Paroxysmal atrial fibrillation ([place]) 01/24/2015  . Right upper quadrant abdominal tenderness without rebound tenderness 03/02/2018  . S/P ascending aortic aneurysm repair  2008    Patient Active Problem List   Diagnosis Date Noted  . S/P placement of cardiac pacemaker 02/21/2020  . Episode of moderate major depression ([place]) 10/10/2019  . Persistent atrial fibrillation ([place])   . Encounter for general adult medical examination with abnormal findings 07/10/2019  . Encounter for screening mammogram for malignant neoplasm of breast 07/10/2019  . Atopic dermatitis 05/02/2019  . Paroxysmal atrial flutter ([place]) 08/11/2018  . Encounter for long-term (current) use of medications 07/09/2018  . Chronic left shoulder pain 07/09/2018  . Conjunctivitis 07/09/2018  . Oxygen dependent 07/09/2018  . GAD (generalized anxiety disorder) 07/09/2018  . Ovarian failure 07/09/2018  . Positive colorectal cancer screening using Cologuard test 04/24/2018  . Calculus of gallbladder with acute on chronic cholecystitis 04/08/2018  . H/O: CVA (cerebrovascular accident) 04/08/2018  . Dysuria 03/02/2018  . Urinary tract infection without hematuria 03/02/2018  . Right upper quadrant abdominal tenderness without rebound tenderness 03/02/2018  . 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 exacerbation ([place]) 03/02/2018  . Chronic obstructive pulmonary disease ([place]) 09/19/2017  . Typical atrial flutter ([place])   . Irritable bowel syndrome without diarrhea 01/29/2015  . Essential hypertension 01/24/2015  . Paroxysmal atrial fibrillation ([place]) 01/24/2015  . History of aortic valve replacement with bioprosthetic valve 01/24/2015  . Atrial fibrillation with RVR ([place]) 01/11/2015  . 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 Impingement syndrome of shoulder  05/18/2014  . Cellulitis and abscess 02/13/2014  . MRSA (methicillin resistant Staphylococcus aureus) 02/13/2014  . Aneurysm, ascending aorta ([place]) 06/23/2013  . Bicuspid aortic valve 06/23/2013    Past Surgical History:  Procedure Laterality Date  . ABDOMINAL AORTIC ANEURYSM REPAIR  2008  . ABDOMINAL  HYSTERECTOMY    . AORTIC VALVE REPLACEMENT  2008  . CARDIAC CATHETERIZATION     [facility]  . CARDIOVERSION N/A 08/15/2018   Procedure: CARDIOVERSION;  Surgeon: [name], MD;  Location: [facility] ORS;  Service: Cardiovascular;  Laterality: N/A;  . CARDIOVERSION N/A 11/14/2018   Procedure: CARDIOVERSION (CATH LAB);  Surgeon: [name], MD;  Location: [facility] ORS;  Service: Cardiovascular;  Laterality: N/A;  . CARDIOVERSION N/A 06/05/2019   Procedure: CARDIOVERSION;  Surgeon: [name], MD;  Location: [facility] ORS;  Service: Cardiovascular;  Laterality: N/A;  . CARDIOVERSION N/A 08/14/2019   Procedure: CARDIOVERSION;  Surgeon: [name], MD;  Location: [facility] ORS;  Service: Cardiovascular;  Laterality: N/A;  . CARDIOVERSION N/A 11/09/2019   Procedure: CARDIOVERSION;  Surgeon: Isaias Cowman, MD;  Location: [facility] ORS;  Service: Cardiovascular;  Laterality: N/A;  . CARDIOVERSION N/A 01/17/2020   Procedure: CARDIOVERSION;  Surgeon: Isaias Cowman, MD;  Location: [facility] ORS;  Service: Cardiovascular;  Laterality: N/A;  . CARPAL TUNNEL RELEASE    . ELECTROPHYSIOLOGIC STUDY N/A 08/17/2016   Procedure: Cardioversion;  Surgeon: [name], MD;  Location: [facility] ORS;  Service: Cardiovascular;  Laterality: N/A;  . TUMOR EXCISION Left    x3 (arm)    Prior to Admission medications   Medication Sig Start Date End Date Taking? Authorizing Provider  acetaminophen (TYLENOL) 500 MG tablet Take 500 mg by mouth every 4 (four) hours as needed for moderate pain or headache.     [provider]  albuterol (PROVENTIL HFA;VENTOLIN HFA) 108 (90 Base) MCG/ACT inhaler Inhale 2 puffs into the lungs every 6 (six) hours as needed for wheezing or shortness of breath. 11/29/18   Ronnell Freshwater, NP  ALPRAZolam (XANAX) 0.25 MG tablet Take 1 tablet (0.25 mg total) by mouth 2 (two) times daily as needed for anxiety. 12/19/19   Ronnell Freshwater, NP  atorvastatin (LIPITOR) 40 MG tablet 1 tab po qhs  03/14/20   Boscia, Greer Ee, NP  azithromycin (ZITHROMAX Z-PAK) 250 MG tablet Take 2 tablets (500 mg) on  Day 1,  followed by 1 tablet (250 mg) once daily on Days 2 through 5. 03/29/20 04/03/20  Arta Silence, MD  bismuth subsalicylate (PEPTO BISMOL) 262 MG/15ML suspension Take 30 mLs by mouth every 6 (six) hours as needed for indigestion or diarrhea or loose stools.    [provider]  Calcium Carbonate-Vitamin D (CALCIUM 600+D PO) Take 1 tablet by mouth daily.    [provider]  Coenzyme Q10 (COQ10) 200 MG CAPS Take 200 mg by mouth daily.    [provider]  diclofenac sodium (VOLTAREN) 1 % GEL To the affected joint Patient taking differently: Apply 2-4 g topically 3 (three) times daily as needed (shoulder pain.). To the affected joint  09/17/17   Ronnell Freshwater, NP  diltiazem (CARDIZEM CD) 120 MG 24 hr capsule Take 1 capsule by mouth daily. Patient taking differently: Take 120 mg by mouth daily.  10/26/19   Minna Merritts, MD  docusate sodium (COLACE) 50 MG capsule Take 100 mg by mouth at bedtime.    [provider]  DULoxetine (CYMBALTA) 60 MG capsule Take 1 capsule (60 mg total) by  mouth 2 (two) times daily. 03/25/20   Boscia, Greer Ee, NP  ELIQUIS 5 MG TABS tablet Take 1 tablet by mouth twice daily. Patient taking differently: Take 5 mg by mouth 2 (two) times daily.  10/26/19   [name], MD  ferrous sulfate (FERROUSUL) 325 (65 FE) MG tablet Take 650 mg by mouth daily with breakfast.     [provider]  flecainide (TAMBOCOR) 50 MG tablet Take 1 tablet (50 mg total) by mouth 2 (two) times daily. PLEASE CONTACT OFFICE [phone] TO SCHEDULE APPOINTMENT FOR FUTURE REFILLS 02/23/20   Deboraha Sprang, MD  Fluticasone-Umeclidin-Vilant (TRELEGY ELLIPTA) 100-62.5-25 MCG/INH AEPB Inhale 1 puff into the lungs daily. 12/26/19   Kendell Bane, NP  hydrALAZINE (APRESOLINE) 25 MG tablet Take 1 tablet (25 mg total) by mouth 3 (three) times daily.  09/27/19 01/11/20  Theora Gianotti, NP  Ketotifen Fumarate (ALLERGY EYE DROPS OP) Place 1 drop into both eyes daily as needed (allergies).    [provider]  levothyroxine (SYNTHROID) 25 MCG tablet Take 1 tablet (25 mcg total) by mouth daily before breakfast. 12/27/19   Ronnell Freshwater, NP  lisinopril-hydrochlorothiazide (ZESTORETIC) 20-25 MG tablet Take 1 tablet by mouth daily. 09/27/19   [name], MD  loratadine (CLARITIN) 10 MG tablet Take 10 mg by mouth daily.     [provider]  OXYGEN Inhale into the lungs.    [provider]  pantoprazole (PROTONIX) 40 MG tablet Take 1 tablet (40 mg total) by mouth daily. 12/27/19   Ronnell Freshwater, NP  potassium chloride (KLOR-CON) 10 MEQ tablet Take 2 tablets (20 mEq total) by mouth 2 (two) times daily. 12/15/19   Ronnell Freshwater, NP  predniSONE (DELTASONE) 20 MG tablet Take 3 tablets (60 mg total) by mouth daily with breakfast for 4 days. 03/29/20 04/02/20  Arta Silence, MD  sodium chloride (OCEAN) 0.65 % SOLN nasal spray Place 1 spray into both nostrils 2 (two) times daily.     [provider]  Tiotropium Bromide-Olodaterol (STIOLTO RESPIMAT) 2.5-2.5 MCG/ACT AERS Inhale 1 puff into the lungs daily. 08/25/19   Kendell Bane, NP  traMADol (ULTRAM) 50 MG tablet Take 50 mg by mouth daily as needed for moderate pain.  12/16/19   [provider]  traZODone (DESYREL) 50 MG tablet Take 1 tablet (50 mg total) by mouth at bedtime. 02/23/20   Ronnell Freshwater, NP  triamcinolone (KENALOG) 0.025 % cream Apply 1 application topically daily as needed (itching).  10/13/19   [provider]    Allergies Benadryl [diphenhydramine hcl (sleep)], Cetirizine, Lasix [furosemide], Levaquin [levofloxacin in d5w], Meloxicam, Soy allergy, and Sulfa antibiotics  Family History  Problem Relation Age of Onset  . Stroke Father   . Stroke Paternal Grandmother     Social History Social History    Tobacco Use  . Smoking status: Former Smoker    Types: Cigarettes  . Smokeless tobacco: Never Used  Vaping Use  . Vaping Use: Never used  Substance Use Topics  . Alcohol use: No  . Drug use: No    Review of Systems  Constitutional: No fever/chills. Eyes: No redness. ENT: No sore throat. Cardiovascular: Denies chest pain. Respiratory: Positive for shortness of breath. Gastrointestinal: No vomiting or diarrhea.  Genitourinary: Negative for dysuria.  Musculoskeletal: Negative for back pain. Skin: Negative for rash. Neurological: Negative for headache.   ____________________________________________   PHYSICAL EXAM:  VITAL SIGNS: ED Triage Vitals  Enc Vitals Group  BP 03/29/20 1424 (!) 141/85     Pulse Rate 03/29/20 1424 87     Resp 03/29/20 1424 20     Temp 03/29/20 1424 98.2 F (36.8 C)     Temp Source 03/29/20 1424 Oral     SpO2 03/29/20 1424 98 %     Weight 03/29/20 1425 180 lb (81.6 kg)     Height 03/29/20 1425 4\' 10"  (1.473 m)     Head Circumference --      Peak Flow --      Pain Score 03/29/20 1424 0     Pain Loc --      Pain Edu? --      Excl. in [place]? --     Constitutional: Alert and oriented.  Relatively well appearing and in no acute distress. Eyes: Conjunctivae are normal.  Head: Atraumatic. Nose: No congestion/rhinnorhea. Mouth/Throat: Mucous membranes are moist.   Neck: Normal range of motion.  Cardiovascular: Normal rate, regular rhythm. Grossly normal heart sounds.  Good peripheral circulation. Respiratory: Minimally increased respiratory effort.  No retractions, speaking in full sentences.  Slightly diminished breath sounds bilaterally. Gastrointestinal: No distention.  Musculoskeletal: No lower extremity edema.  Extremities warm and well perfused.  Neurologic:  Normal speech and language. No gross focal neurologic deficits are appreciated.  Skin:  Skin is warm and dry. No rash noted. Psychiatric: Mood and affect are normal. Speech and  behavior are normal.  ____________________________________________   LABS (all labs ordered are listed, but only abnormal results are displayed)  Labs Reviewed  BASIC METABOLIC PANEL - Abnormal; Notable for the following components:      Result Value   Potassium 3.4 (*)    Glucose, Bld 123 (*)    All other components within normal limits  CBC - Abnormal; Notable for the following components:   Hemoglobin 10.4 (*)    HCT 33.5 (*)    MCH 25.6 (*)    RDW 18.2 (*)    All other components within normal limits  TROPONIN I (HIGH SENSITIVITY) - Abnormal; Notable for the following components:   Troponin I (High Sensitivity) 19 (*)    All other components within normal limits  TROPONIN I (HIGH SENSITIVITY) - Abnormal; Notable for the following components:   Troponin I (High Sensitivity) 22 (*)    All other components within normal limits   ____________________________________________  EKG  ED ECG REPORT I, Arta Silence, the attending physician, personally viewed and interpreted this ECG.  Date: 03/29/2020 EKG Time: 1437 Rate: 86 Rhythm: Paced rhythm ST/T Wave abnormalities: normal Narrative Interpretation: no evidence of acute ischemia  ____________________________________________  RADIOLOGY  CXR: No focal infiltrate or edema ____________________________________________   PROCEDURES  Procedure(s) performed: No  Procedures  Critical Care performed: No ____________________________________________   INITIAL IMPRESSION / ASSESSMENT AND PLAN / ED COURSE  Pertinent labs & imaging results that were available during my care of the patient were reviewed by me and considered in my medical decision making (see chart for details).  74 year old female with PMH as noted above including COPD on 3 L home O2 and status post pacemaker placed last month (for refractory atrial fibrillation status post AV junction ablation) presents with worsening shortness of breath over the  last week associated with nonproductive cough, but no leg swelling, chest pain, or fever.  I reviewed the past medical records in [place].  The patient was seen in the ED last month for foot pain.  She has had no recent admissions.  On exam, she  is overall quite well-appearing.  Her vital signs are normal except for hypertension.  She is speaking in full sentences and demonstrates minimal increased work of breathing but no respiratory distress or accessory muscle use.  Breath sounds are somewhat diminished bilaterally with no significant wheezing.  There is no peripheral edema.  The physical exam is otherwise unremarkable.  The patient waited for approximately 8 hours prior to being placed in a room and seen by me.  During this time, basic labs were completed which are within normal limits for the patient.  Troponins x2 are around 20 with no significant increase over 2 hours.  This is not consistent with ACS, especially given the lack of chest pain.  EKG shows a LBBB pattern consistent with her pacemaker and no ischemic changes.  Chest x-ray shows mild bibasilar atelectasis; infiltrate cannot be ruled out.  Overall presentation is consistent with COPD exacerbation.  Clinically there is no evidence of pneumonia.  The patient is vaccinated for COVID-19.  There is also no evidence of acute cardiac etiology, or of PE.  At this time, the patient is oxygenating well on her normal 2 to 3 L O2 by nasal cannula.  She has a strong desire to go home.  I will give a dose of prednisone, and a DuoNeb, although I feel that the patient is stable for discharge home.  I will prescribe a course of prednisone and azithromycin for the patient to take in addition to her normal bronchodilators.  She has an appointment with her pulmonologist scheduled in 4 days.  ----------------------------------------- 11:23 PM on 03/29/2020 -----------------------------------------  The patient has received her medications, and a family  member is here to pick her up so she wants to go home.  She is stable for discharge at this time.  Return precautions given, and she expresses understanding. ____________________________________________   FINAL CLINICAL IMPRESSION(S) / ED DIAGNOSES  Final diagnoses:  COPD exacerbation ([place])      NEW MEDICATIONS STARTED DURING THIS VISIT:  New Prescriptions   AZITHROMYCIN (ZITHROMAX Z-PAK) 250 MG TABLET    Take 2 tablets (500 mg) on  Day 1,  followed by 1 tablet (250 mg) once daily on Days 2 through 5.   PREDNISONE (DELTASONE) 20 MG TABLET    Take 3 tablets (60 mg total) by mouth daily with breakfast for 4 days.     Note:  This document was prepared using Dragon voice recognition software and may include unintentional dictation errors.   Arta Silence, MD 03/29/20 [phone]

## 2020-04-01 ENCOUNTER — Telehealth: Payer: Self-pay

## 2020-04-01 DIAGNOSIS — J449 Chronic obstructive pulmonary disease, unspecified: Secondary | ICD-10-CM | POA: Diagnosis not present

## 2020-04-01 NOTE — Telephone Encounter (Signed)
Patient rescheduled appointment on 04/02/2020 to 04/15/2020. [facility]

## 2020-04-02 ENCOUNTER — Ambulatory Visit: Payer: Medicare Other | Admitting: Internal Medicine

## 2020-04-11 ENCOUNTER — Telehealth: Payer: Self-pay

## 2020-04-11 NOTE — Telephone Encounter (Signed)
confirmed and screened for office visit 8/9

## 2020-04-15 ENCOUNTER — Other Ambulatory Visit: Payer: Self-pay

## 2020-04-15 ENCOUNTER — Ambulatory Visit: Payer: Medicare Other | Admitting: Internal Medicine

## 2020-04-15 ENCOUNTER — Encounter: Payer: Self-pay | Admitting: Internal Medicine

## 2020-04-15 VITALS — BP 164/82 | HR 89 | Temp 97.9°F | Resp 16 | Ht <= 58 in | Wt 181.4 lb

## 2020-04-15 DIAGNOSIS — J449 Chronic obstructive pulmonary disease, unspecified: Secondary | ICD-10-CM | POA: Diagnosis not present

## 2020-04-15 DIAGNOSIS — I4819 Other persistent atrial fibrillation: Secondary | ICD-10-CM

## 2020-04-15 DIAGNOSIS — J9611 Chronic respiratory failure with hypoxia: Secondary | ICD-10-CM | POA: Diagnosis not present

## 2020-04-15 DIAGNOSIS — Z950 Presence of cardiac pacemaker: Secondary | ICD-10-CM | POA: Diagnosis not present

## 2020-04-15 DIAGNOSIS — Z9981 Dependence on supplemental oxygen: Secondary | ICD-10-CM

## 2020-04-15 MED ORDER — ALBUTEROL SULFATE (2.5 MG/3ML) 0.083% IN NEBU: 2.5000 mg | INHALATION_SOLUTION | Freq: Four times a day (QID) | RESPIRATORY_TRACT | 1 refills | Status: DC | PRN

## 2020-04-15 MED ORDER — ALBUTEROL SULFATE HFA 108 (90 BASE) MCG/ACT IN AERS: 2.0000 | INHALATION_SPRAY | Freq: Four times a day (QID) | RESPIRATORY_TRACT | 3 refills | Status: DC | PRN

## 2020-04-15 NOTE — Progress Notes (Signed)
[facility] [place], [place] [address]  Pulmonary Sleep Medicine   Office Visit Note  Patient Name: [name] DOB: [birth year] MRN [id number]  Date of Service: 04/15/2020  Complaints/HPI: Pt is seen for pulm follow up on PFT.  Her PFt shows mild obstructive lung disease which is consistent with her copd.  She continues on oxygen 2.5 lpm continually.  She did recently go to the Ed for copd exacerbation.  She received Duonebs, prednisone and azithromycin.  She reports she is doing well now.  Denies any other issues at this time.   ROS  General: (-) fever, (-) chills, (-) night sweats, (-) weakness Skin: (-) rashes, (-) itching,. Eyes: (-) visual changes, (-) redness, (-) itching. Nose and Sinuses: (-) nasal stuffiness or itchiness, (-) postnasal drip, (-) nosebleeds, (-) sinus trouble. Mouth and Throat: (-) sore throat, (-) hoarseness. Neck: (-) swollen glands, (-) enlarged thyroid, (-) neck pain. Respiratory: - cough, (-) bloody sputum, + shortness of breath, - wheezing. Cardiovascular: - ankle swelling, (-) chest pain. Lymphatic: (-) lymph node enlargement. Neurologic: (-) numbness, (-) tingling. Psychiatric: (-) anxiety, (-) depression   Current Medication: Outpatient Encounter Medications as of 04/15/2020  Medication Sig  . acetaminophen (TYLENOL) 500 MG tablet Take 500 mg by mouth every 4 (four) hours as needed for moderate pain or headache.   . albuterol (PROVENTIL HFA;VENTOLIN HFA) 108 (90 Base) MCG/ACT inhaler Inhale 2 puffs into the lungs every 6 (six) hours as needed for wheezing or shortness of breath.  . ALPRAZolam (XANAX) 0.25 MG tablet Take 1 tablet (0.25 mg total) by mouth 2 (two) times daily as needed for anxiety.  [name] atorvastatin (LIPITOR) 40 MG tablet 1 tab po qhs  . bismuth subsalicylate (PEPTO BISMOL) 262 MG/15ML suspension Take 30 mLs by mouth every 6 (six) hours as needed for indigestion or diarrhea or loose stools.  . Calcium  Carbonate-Vitamin D (CALCIUM 600+D PO) Take 1 tablet by mouth daily.  . Coenzyme Q10 (COQ10) 200 MG CAPS Take 200 mg by mouth daily.  . diclofenac sodium (VOLTAREN) 1 % GEL To the affected joint (Patient taking differently: Apply 2-4 g topically 3 (three) times daily as needed (shoulder pain.). To the affected joint )  . docusate sodium (COLACE) 50 MG capsule Take 100 mg by mouth at bedtime.  . DULoxetine (CYMBALTA) 60 MG capsule Take 1 capsule (60 mg total) by mouth 2 (two) times daily.  [name] ELIQUIS 5 MG TABS tablet Take 1 tablet by mouth twice daily. (Patient taking differently: Take 5 mg by mouth 2 (two) times daily. )  . ferrous sulfate (FERROUSUL) 325 (65 FE) MG tablet Take 650 mg by mouth daily with breakfast.   . flecainide (TAMBOCOR) 50 MG tablet Take 1 tablet (50 mg total) by mouth 2 (two) times daily. PLEASE CONTACT OFFICE [phone] TO SCHEDULE APPOINTMENT FOR FUTURE REFILLS  . Fluticasone-Umeclidin-Vilant (TRELEGY ELLIPTA) 100-62.5-25 MCG/INH AEPB Inhale 1 puff into the lungs daily.  [name] Ketotifen Fumarate (ALLERGY EYE DROPS OP) Place 1 drop into both eyes daily as needed (allergies).  [name] levothyroxine (SYNTHROID) 25 MCG tablet Take 1 tablet (25 mcg total) by mouth daily before breakfast.  . lisinopril-hydrochlorothiazide (ZESTORETIC) 20-25 MG tablet Take 1 tablet by mouth daily.  [name] loratadine (CLARITIN) 10 MG tablet Take 10 mg by mouth daily.   . OXYGEN Inhale into the lungs.  . pantoprazole (PROTONIX) 40 MG tablet Take 1 tablet (40 mg total) by mouth daily.  . potassium chloride (KLOR-CON) 10  MEQ tablet Take 2 tablets (20 mEq total) by mouth 2 (two) times daily.  . sodium chloride (OCEAN) 0.65 % SOLN nasal spray Place 1 spray into both nostrils 2 (two) times daily.   . Tiotropium Bromide-Olodaterol (STIOLTO RESPIMAT) 2.5-2.5 MCG/ACT AERS Inhale 1 puff into the lungs daily.  . traMADol (ULTRAM) 50 MG tablet Take 50 mg by mouth daily as needed for moderate pain.   . traZODone (DESYREL) 50  MG tablet Take 1 tablet (50 mg total) by mouth at bedtime.  . triamcinolone (KENALOG) 0.025 % cream Apply 1 application topically daily as needed (itching).   . [DISCONTINUED] diltiazem (CARDIZEM CD) 120 MG 24 hr capsule Take 1 capsule by mouth daily. (Patient taking differently: Take 120 mg by mouth daily. )  . hydrALAZINE (APRESOLINE) 25 MG tablet Take 1 tablet (25 mg total) by mouth 3 (three) times daily.   No facility-administered encounter medications on file as of 04/15/2020.    Surgical History: Past Surgical History:  Procedure Laterality Date  . ABDOMINAL AORTIC ANEURYSM REPAIR  2008  . ABDOMINAL HYSTERECTOMY    . AORTIC VALVE REPLACEMENT  2008  . CARDIAC CATHETERIZATION     [facility]  . CARDIOVERSION N/A 08/15/2018   Procedure: CARDIOVERSION;  Surgeon: [name], MD;  Location: [facility] ORS;  Service: Cardiovascular;  Laterality: N/A;  . CARDIOVERSION N/A 11/14/2018   Procedure: CARDIOVERSION (CATH LAB);  Surgeon: [name], MD;  Location: [facility] ORS;  Service: Cardiovascular;  Laterality: N/A;  . CARDIOVERSION N/A 06/05/2019   Procedure: CARDIOVERSION;  Surgeon: [name], MD;  Location: [facility] ORS;  Service: Cardiovascular;  Laterality: N/A;  . CARDIOVERSION N/A 08/14/2019   Procedure: CARDIOVERSION;  Surgeon: [name], MD;  Location: [facility] ORS;  Service: Cardiovascular;  Laterality: N/A;  . CARDIOVERSION N/A 11/09/2019   Procedure: CARDIOVERSION;  Surgeon: Isaias Cowman, MD;  Location: [facility] ORS;  Service: Cardiovascular;  Laterality: N/A;  . CARDIOVERSION N/A 01/17/2020   Procedure: CARDIOVERSION;  Surgeon: Isaias Cowman, MD;  Location: [facility] ORS;  Service: Cardiovascular;  Laterality: N/A;  . CARPAL TUNNEL RELEASE    . ELECTROPHYSIOLOGIC STUDY N/A 08/17/2016   Procedure: Cardioversion;  Surgeon: [name], MD;  Location: [facility] ORS;  Service: Cardiovascular;  Laterality: N/A;  . TUMOR EXCISION Left    x3 (arm)    Medical History: Past  Medical History:  Diagnosis Date  . 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 s/p bioprosthetic valve replacement 2008 at [facility];  b. 01/2015 Echo: EF 60-65%, no rwma, Gr1 DD, mildly dil LA, nl RV fxn.  . Atrial fibrillation with RVR ([place]) 01/11/2015  . Atrial flutter ([place])    a. 08/2016 s/p DCCV.  Remains on flecainide 50 mg bid.  . Basal skull fracture ([place]) 20 yrs ago  . Chronic respiratory failure ([place])   . COPD (chronic obstructive pulmonary disease) ([place])    a. on home O2 at 2L since 2008  . 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 Essential hypertension 01/24/2015  . History of cardiac cath    a. 2008 prior to Aortic aneurysm repair-->nl cors.  . History of stress test    a. 10/2015 MV: no ischemia/infarct.  [name] HLD (hyperlipidemia)   . HTN (hypertension)   . Hypothyroidism   . Obesity   . PAF (paroxysmal atrial fibrillation) ([name])    a. on Eliquis; b. CHADS2VASc = 3 (HTN, age x 1, female).  [name]  Paroxysmal atrial fibrillation ([place]) 01/24/2015  . Right upper quadrant abdominal tenderness without rebound tenderness 03/02/2018  . S/P ascending aortic aneurysm repair 2008    Family History: Family History  Problem Relation Age of Onset  . Stroke Father   . Stroke Paternal Grandmother     Social History: Social History   Socioeconomic History  . Marital status: Single    Spouse name: Not on file  . Number of children: Not on file  . Years of education: Not on file  . Highest education level: Not on file  Occupational History  . Not on file  Tobacco Use  . Smoking status: Former Smoker    Types: Cigarettes  . Smokeless tobacco: Never Used  Vaping Use  . Vaping Use: Never used  Substance and Sexual Activity  . Alcohol use: No  . Drug use: No  . Sexual activity: Never    Birth control/protection: Surgical  Other Topics Concern  . Not on file  Social History Narrative  . Not on file   Social Determinants of Health   Financial Resource  Strain:   . Difficulty of Paying Living Expenses:   Food Insecurity:   . Worried About Charity fundraiser in the Last Year:   . Arboriculturist in the Last Year:   Transportation Needs:   . Film/video editor (Medical):   [name] Lack of Transportation (Non-Medical):   Physical Activity:   . Days of Exercise per Week:   . Minutes of Exercise per Session:   Stress:   . Feeling of Stress :   Social Connections:   . Frequency of Communication with Friends and Family:   . Frequency of Social Gatherings with Friends and Family:   . Attends Religious Services:   . Active Member of Clubs or Organizations:   . Attends Archivist Meetings:   [name] Marital Status:   Intimate Partner Violence:   . Fear of Current or Ex-Partner:   . Emotionally Abused:   [name] Physically Abused:   . Sexually Abused:     Vital Signs: Blood pressure (!) 164/82, pulse 89, temperature 97.9 F (36.6 C), resp. rate 16, height 4\' 10"  (1.473 m), weight 181 lb 6.4 oz (82.3 kg), SpO2 92 %.  Examination: General Appearance: The patient is well-developed, well-nourished, and in no distress. Skin: Gross inspection of skin unremarkable. Head: normocephalic, no gross deformities. Eyes: no gross deformities noted. ENT: ears appear grossly normal no exudates. Neck: Supple. No thyromegaly. No LAD. Respiratory: clear bilaterally. Cardiovascular: Normal S1 and S2 without murmur or rub. Extremities: No cyanosis. pulses are equal. Neurologic: Alert and oriented. No involuntary movements.  LABS: Recent Results (from the past 2160 hour(s))  Pulmonary Function Test     Status: None   Collection Time: 03/13/20 11:30 AM  Result Value Ref Range   FEV1     FVC     FEV1/FVC     TLC     DLCO    Basic metabolic panel     Status: Abnormal   Collection Time: 03/29/20  2:39 PM  Result Value Ref Range   Sodium 135 135 - 145 mmol/L   Potassium 3.4 (L) 3.5 - 5.1 mmol/L   Chloride 98 98 - 111 mmol/L   CO2 28 22 - 32 mmol/L    Glucose, Bld 123 (H) 70 - 99 mg/dL    Comment: Glucose reference range applies only to samples taken after fasting for at least 8 hours.  BUN 20 8 - 23 mg/dL   Creatinine, Ser 0.75 0.44 - 1.00 mg/dL   Calcium 9.2 8.9 - 10.3 mg/dL   GFR calc non Af Amer >60 >60 mL/min   GFR calc Af Amer >60 >60 mL/min   Anion gap 9 5 - 15    Comment: Performed at [facility], [address] [place], [place] [address]  CBC     Status: Abnormal   Collection Time: 03/29/20  2:39 PM  Result Value Ref Range   WBC 8.0 4.0 - 10.5 K/uL   RBC 4.06 3.87 - 5.11 MIL/uL   Hemoglobin 10.4 (L) 12.0 - 15.0 g/dL   HCT 33.5 (L) 36 - 46 %   MCV 82.5 80.0 - 100.0 fL   MCH 25.6 (L) 26.0 - 34.0 pg   MCHC 31.0 30.0 - 36.0 g/dL   RDW 18.2 (H) 11.5 - 15.5 %   Platelets 374 150 - 400 K/uL   nRBC 0.0 0.0 - 0.2 %    Comment: Performed at [facility], [place], [place] [address]  Troponin I (High Sensitivity)     Status: Abnormal   Collection Time: 03/29/20  2:39 PM  Result Value Ref Range   Troponin I (High Sensitivity) 19 (H) <18 ng/L    Comment: (NOTE) Elevated high sensitivity troponin I (hsTnI) values and significant  changes across serial measurements may suggest ACS but many other  chronic and acute conditions are known to elevate hsTnI results.  Refer to the "Links" section for chest pain algorithms and additional  guidance. Performed at [facility], [place], [place] [address]   Troponin I (High Sensitivity)     Status: Abnormal   Collection Time: 03/29/20  4:45 PM  Result Value Ref Range   Troponin I (High Sensitivity) 22 (H) <18 ng/L    Comment: (NOTE) Elevated high sensitivity troponin I (hsTnI) values and significant  changes across serial measurements may suggest ACS but many other  chronic and acute conditions are known to elevate hsTnI results.  Refer to the "Links" section for chest pain algorithms and additional  guidance. Performed  at [facility], [address], [place], [place] [address]     Radiology: DG Chest 2 View  Result Date: 03/29/2020 CLINICAL DATA:  Shortness of breath and chest tightness upon exertion. EXAM: CHEST - 2 VIEW COMPARISON:  May 24, 2019 FINDINGS: Multiple sternal wires are seen. A dual lead AICD is noted. Mild to moderate severity diffusely increased interstitial lung markings are seen which appear to be chronic in nature. Very mild areas of atelectasis and/or infiltrate are seen within the bilateral lung bases. Small bilateral pleural effusions are noted. No pneumothorax is identified. Radiopaque surgical clips are seen overlying the lateral aspect of the upper right lung. The cardiac silhouette is markedly enlarged. Degenerative changes seen throughout the thoracic spine. IMPRESSION: 1. Evidence of prior median sternotomy/CABG. 2. Very mild bibasilar atelectasis and/or infiltrate. Small bilateral pleural effusions. Electronically Signed   By: Virgina Norfolk M.D.   On: 03/29/2020 15:16    No results found.  DG Chest 2 View  Result Date: 03/29/2020 CLINICAL DATA:  Shortness of breath and chest tightness upon exertion. EXAM: CHEST - 2 VIEW COMPARISON:  May 24, 2019 FINDINGS: Multiple sternal wires are seen. A dual lead AICD is noted. Mild to moderate severity diffusely increased interstitial lung markings are seen which appear to be chronic in nature. Very mild areas of atelectasis and/or  infiltrate are seen within the bilateral lung bases. Small bilateral pleural effusions are noted. No pneumothorax is identified. Radiopaque surgical clips are seen overlying the lateral aspect of the upper right lung. The cardiac silhouette is markedly enlarged. Degenerative changes seen throughout the thoracic spine. IMPRESSION: 1. Evidence of prior median sternotomy/CABG. 2. Very mild bibasilar atelectasis and/or infiltrate. Small bilateral pleural effusions. Electronically Signed   By:  Virgina Norfolk M.D.   On: 03/29/2020 15:16      Assessment and Plan: Patient Active Problem List   Diagnosis Date Noted  . S/P placement of cardiac pacemaker 02/21/2020  . Episode of moderate major depression ([place]) 10/10/2019  . Persistent atrial fibrillation ([place])   . Encounter for general adult medical examination with abnormal findings 07/10/2019  . Encounter for screening mammogram for malignant neoplasm of breast 07/10/2019  . Atopic dermatitis 05/02/2019  . Paroxysmal atrial flutter ([place]) 08/11/2018  . Encounter for long-term (current) use of medications 07/09/2018  . Chronic left shoulder pain 07/09/2018  . Conjunctivitis 07/09/2018  . Oxygen dependent 07/09/2018  . GAD (generalized anxiety disorder) 07/09/2018  . Ovarian failure 07/09/2018  . Positive colorectal cancer screening using Cologuard test 04/24/2018  . Calculus of gallbladder with acute on chronic cholecystitis 04/08/2018  . H/O: CVA (cerebrovascular accident) 04/08/2018  . Dysuria 03/02/2018  . Urinary tract infection without hematuria 03/02/2018  . Right upper quadrant abdominal tenderness without rebound tenderness 03/02/2018  . 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 exacerbation ([place]) 03/02/2018  . Chronic obstructive pulmonary disease ([place]) 09/19/2017  . Typical atrial flutter ([place])   . Irritable bowel syndrome without diarrhea 01/29/2015  . Essential hypertension 01/24/2015  . Paroxysmal atrial fibrillation ([place]) 01/24/2015  . History of aortic valve replacement with bioprosthetic valve 01/24/2015  . Atrial fibrillation with RVR ([place]) 01/11/2015  . 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 Impingement syndrome of shoulder 05/18/2014  . Cellulitis and abscess 02/13/2014  . MRSA (methicillin resistant Staphylococcus aureus) 02/13/2014  . Aneurysm, ascending aorta ([place]) 06/23/2013  . Bicuspid aortic valve 06/23/2013    1. Chronic obstructive pulmonary disease,  unspecified COPD type ([place]) Continue current management.  Refilled nebs and inhalers, continue to use as directed.  - albuterol (VENTOLIN HFA) 108 (90 Base) MCG/ACT inhaler; Inhale 2 puffs into the lungs every 6 (six) hours as needed for wheezing or shortness of breath.  Dispense: 18 g; Refill: 3 - albuterol (PROVENTIL) (2.5 MG/3ML) 0.083% nebulizer solution; Take 3 mLs (2.5 mg total) by nebulization every 6 (six) hours as needed for wheezing or shortness of breath.  Dispense: 150 mL; Refill: 1  2. Persistent atrial fibrillation ([place]) Continue to monitor and follow up with Cardiology as scheduled.   3. S/P placement of cardiac pacemaker Doing well since placement.    4. Chronic respiratory failure with hypoxia ([name]) Stable, continue to use oxygen as prescribed continually.   5. Oxygen dependent Continue with round the clock oxygen 2lpm as ordered.   General Counseling: I have discussed the findings of the evaluation and examination with Mysti.  I have also discussed any further diagnostic evaluation thatmay be needed or ordered today. Ed verbalizes understanding of the findings of todays visit. We also reviewed her medications today and discussed drug interactions and side effects including but not limited excessive drowsiness and altered mental states. We also discussed that there is always a risk not just to her but also people around her. she has been encouraged to call the office with any questions or concerns that  should arise related to todays visit.  No orders of the defined types were placed in this encounter.    Time spent: 30 This patient was seen by Orson Gear AGNP-C in Collaboration with Dr. Devona Konig as a part of collaborative care agreement.   I have personally obtained a history, examined the patient, evaluated laboratory and imaging results, formulated the assessment and plan and placed orders.    Allyne Gee, MD [facility] Pulmonary and Critical Care Sleep  medicine

## 2020-04-16 ENCOUNTER — Telehealth: Payer: Self-pay

## 2020-04-16 NOTE — Telephone Encounter (Signed)
Medical record request completed and records faxed to [place] [phone].

## 2020-04-18 ENCOUNTER — Telehealth: Payer: Self-pay

## 2020-04-18 NOTE — Telephone Encounter (Signed)
Confirmed and screened for 04-22-20 ov. 

## 2020-04-22 ENCOUNTER — Encounter: Payer: Self-pay | Admitting: Nurse Practitioner

## 2020-04-22 ENCOUNTER — Other Ambulatory Visit: Payer: Self-pay

## 2020-04-22 ENCOUNTER — Ambulatory Visit: Payer: Medicare Other | Admitting: Nurse Practitioner

## 2020-04-22 VITALS — BP 142/68 | HR 83 | Temp 97.5°F | Resp 16 | Ht <= 58 in | Wt 181.8 lb

## 2020-04-22 DIAGNOSIS — J0110 Acute frontal sinusitis, unspecified: Secondary | ICD-10-CM | POA: Diagnosis not present

## 2020-04-22 DIAGNOSIS — F411 Generalized anxiety disorder: Secondary | ICD-10-CM

## 2020-04-22 DIAGNOSIS — R42 Dizziness and giddiness: Secondary | ICD-10-CM | POA: Insufficient documentation

## 2020-04-22 DIAGNOSIS — Z9981 Dependence on supplemental oxygen: Secondary | ICD-10-CM

## 2020-04-22 DIAGNOSIS — J449 Chronic obstructive pulmonary disease, unspecified: Secondary | ICD-10-CM

## 2020-04-22 MED ORDER — AMOXICILLIN 500 MG PO CAPS: 500.0000 mg | ORAL_CAPSULE | Freq: Three times a day (TID) | ORAL | 0 refills | Status: DC

## 2020-04-22 MED ORDER — MECLIZINE HCL 12.5 MG PO TABS: 12.5000 mg | ORAL_TABLET | Freq: Two times a day (BID) | ORAL | 1 refills | Status: DC | PRN

## 2020-04-22 NOTE — Progress Notes (Signed)
[facility] [place], [place] [address]  Internal MEDICINE  Office Visit Note  Patient Name: [name]  [id number]  [id number]  Date of Service: 04/22/2020  Chief Complaint  Patient presents with  . Follow-up  . Hyperlipidemia  . Hypertension    The patient is here for routine follow up visit. She was given a pacemaker in the beginning of June, 2021. She states that she has been doing well since she got this done. She hurt her right ankle after the pacemaker was placed. No fracture was noted, but she did have significant sprain. Today, she states that she has been having some intermittent dizziness. This has been happening for a few weeks. Used to have this issue when ears would get 'stopped up." she also has some nasal congestion. She has noted some light bleeding from the nose as well. She states that dizziness gets worse when she moves her head around. Causes a little nausea, but does not feel like she is going to throw up or anything like that.       Current Medication: Outpatient Encounter Medications as of 04/22/2020  Medication Sig  . acetaminophen (TYLENOL) 500 MG tablet Take 500 mg by mouth every 4 (four) hours as needed for moderate pain or headache.   . albuterol (PROVENTIL) (2.5 MG/3ML) 0.083% nebulizer solution Take 3 mLs (2.5 mg total) by nebulization every 6 (six) hours as needed for wheezing or shortness of breath.  [name] albuterol (VENTOLIN HFA) 108 (90 Base) MCG/ACT inhaler Inhale 2 puffs into the lungs every 6 (six) hours as needed for wheezing or shortness of breath.  . ALPRAZolam (XANAX) 0.25 MG tablet Take 1 tablet (0.25 mg total) by mouth 2 (two) times daily as needed for anxiety.  [name] atorvastatin (LIPITOR) 40 MG tablet 1 tab po qhs  . bismuth subsalicylate (PEPTO BISMOL) 262 MG/15ML suspension Take 30 mLs by mouth every 6 (six) hours as needed for indigestion or diarrhea or loose stools.  . Calcium Carbonate-Vitamin D (CALCIUM 600+D PO)  Take 1 tablet by mouth daily.  . Coenzyme Q10 (COQ10) 200 MG CAPS Take 200 mg by mouth daily.  . diclofenac sodium (VOLTAREN) 1 % GEL To the affected joint (Patient taking differently: Apply 2-4 g topically 3 (three) times daily as needed (shoulder pain.). To the affected joint )  . docusate sodium (COLACE) 50 MG capsule Take 100 mg by mouth at bedtime.  . DULoxetine (CYMBALTA) 60 MG capsule Take 1 capsule (60 mg total) by mouth 2 (two) times daily.  [name] ELIQUIS 5 MG TABS tablet Take 1 tablet by mouth twice daily. (Patient taking differently: Take 5 mg by mouth 2 (two) times daily. )  . ferrous sulfate (FERROUSUL) 325 (65 FE) MG tablet Take 650 mg by mouth daily with breakfast.   . flecainide (TAMBOCOR) 50 MG tablet Take 1 tablet (50 mg total) by mouth 2 (two) times daily. PLEASE CONTACT OFFICE [phone] TO SCHEDULE APPOINTMENT FOR FUTURE REFILLS  . Fluticasone-Umeclidin-Vilant (TRELEGY ELLIPTA) 100-62.5-25 MCG/INH AEPB Inhale 1 puff into the lungs daily.  [name] Ketotifen Fumarate (ALLERGY EYE DROPS OP) Place 1 drop into both eyes daily as needed (allergies).  [name] levothyroxine (SYNTHROID) 25 MCG tablet Take 1 tablet (25 mcg total) by mouth daily before breakfast.  . lisinopril-hydrochlorothiazide (ZESTORETIC) 20-25 MG tablet Take 1 tablet by mouth daily.  [name] loratadine (CLARITIN) 10 MG tablet Take 10 mg by mouth daily.   . OXYGEN Inhale into the lungs.  . pantoprazole (PROTONIX)  40 MG tablet Take 1 tablet (40 mg total) by mouth daily.  . potassium chloride (KLOR-CON) 10 MEQ tablet Take 2 tablets (20 mEq total) by mouth 2 (two) times daily.  . sodium chloride (OCEAN) 0.65 % SOLN nasal spray Place 1 spray into both nostrils 2 (two) times daily.   . Tiotropium Bromide-Olodaterol (STIOLTO RESPIMAT) 2.5-2.5 MCG/ACT AERS Inhale 1 puff into the lungs daily.  . traMADol (ULTRAM) 50 MG tablet Take 50 mg by mouth daily as needed for moderate pain.   . traZODone (DESYREL) 50 MG tablet Take 1 tablet (50 mg total)  by mouth at bedtime.  . triamcinolone (KENALOG) 0.025 % cream Apply 1 application topically daily as needed (itching).   [name] amoxicillin (AMOXIL) 500 MG capsule Take 1 capsule (500 mg total) by mouth 3 (three) times daily.  . hydrALAZINE (APRESOLINE) 25 MG tablet Take 1 tablet (25 mg total) by mouth 3 (three) times daily.  . meclizine (ANTIVERT) 12.5 MG tablet Take 1 tablet (12.5 mg total) by mouth 2 (two) times daily as needed for dizziness.   No facility-administered encounter medications on file as of 04/22/2020.    Surgical History: Past Surgical History:  Procedure Laterality Date  . ABDOMINAL AORTIC ANEURYSM REPAIR  2008  . ABDOMINAL HYSTERECTOMY    . AORTIC VALVE REPLACEMENT  2008  . CARDIAC CATHETERIZATION     [facility]  . CARDIOVERSION N/A 08/15/2018   Procedure: CARDIOVERSION;  Surgeon: [name], MD;  Location: [facility] ORS;  Service: Cardiovascular;  Laterality: N/A;  . CARDIOVERSION N/A 11/14/2018   Procedure: CARDIOVERSION (CATH LAB);  Surgeon: [name], MD;  Location: [facility] ORS;  Service: Cardiovascular;  Laterality: N/A;  . CARDIOVERSION N/A 06/05/2019   Procedure: CARDIOVERSION;  Surgeon: [name], MD;  Location: [facility] ORS;  Service: Cardiovascular;  Laterality: N/A;  . CARDIOVERSION N/A 08/14/2019   Procedure: CARDIOVERSION;  Surgeon: [name], MD;  Location: [facility] ORS;  Service: Cardiovascular;  Laterality: N/A;  . CARDIOVERSION N/A 11/09/2019   Procedure: CARDIOVERSION;  Surgeon: Isaias Cowman, MD;  Location: [facility] ORS;  Service: Cardiovascular;  Laterality: N/A;  . CARDIOVERSION N/A 01/17/2020   Procedure: CARDIOVERSION;  Surgeon: Isaias Cowman, MD;  Location: [facility] ORS;  Service: Cardiovascular;  Laterality: N/A;  . CARPAL TUNNEL RELEASE    . ELECTROPHYSIOLOGIC STUDY N/A 08/17/2016   Procedure: Cardioversion;  Surgeon: [name], MD;  Location: [facility] ORS;  Service: Cardiovascular;  Laterality: N/A;  . TUMOR EXCISION Left    x3 (arm)     Medical History: Past Medical History:  Diagnosis Date  . 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 s/p bioprosthetic valve replacement 2008 at [facility];  b. 01/2015 Echo: EF 60-65%, no rwma, Gr1 DD, mildly dil LA, nl RV fxn.  . Atrial fibrillation with RVR ([place]) 01/11/2015  . Atrial flutter ([place])    a. 08/2016 s/p DCCV.  Remains on flecainide 50 mg bid.  . Basal skull fracture ([place]) 20 yrs ago  . Chronic respiratory failure ([place])   . COPD (chronic obstructive pulmonary disease) ([place])    a. on home O2 at 2L since 2008  . 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 Essential hypertension 01/24/2015  . History of cardiac cath    a. 2008 prior to Aortic aneurysm repair-->nl cors.  . History of stress test    a. 10/2015 MV: no ischemia/infarct.  [name] HLD (hyperlipidemia)   . HTN (hypertension)   .  Hypothyroidism   . Obesity   . PAF (paroxysmal atrial fibrillation) ([name])    a. on Eliquis; b. CHADS2VASc = 3 (HTN, age x 1, female).  . Paroxysmal atrial fibrillation ([place]) 01/24/2015  . Right upper quadrant abdominal tenderness without rebound tenderness 03/02/2018  . S/P ascending aortic aneurysm repair 2008    Family History: Family History  Problem Relation Age of Onset  . Stroke Father   . Stroke Paternal Grandmother     Social History   Socioeconomic History  . Marital status: Single    Spouse name: Not on file  . Number of children: Not on file  . Years of education: Not on file  . Highest education level: Not on file  Occupational History  . Not on file  Tobacco Use  . Smoking status: Former Smoker    Types: Cigarettes  . Smokeless tobacco: Never Used  Vaping Use  . Vaping Use: Never used  Substance and Sexual Activity  . Alcohol use: No  . Drug use: No  . Sexual activity: Never    Birth control/protection: Surgical  Other Topics Concern  . Not on file  Social History Narrative  . Not on file   Social Determinants of Health   Financial  Resource Strain:   . Difficulty of Paying Living Expenses:   Food Insecurity:   . Worried About Charity fundraiser in the Last Year:   . Arboriculturist in the Last Year:   Transportation Needs:   . Film/video editor (Medical):   [name] Lack of Transportation (Non-Medical):   Physical Activity:   . Days of Exercise per Week:   . Minutes of Exercise per Session:   Stress:   . Feeling of Stress :   Social Connections:   . Frequency of Communication with Friends and Family:   . Frequency of Social Gatherings with Friends and Family:   . Attends Religious Services:   . Active Member of Clubs or Organizations:   . Attends Archivist Meetings:   [name] Marital Status:   Intimate Partner Violence:   . Fear of Current or Ex-Partner:   . Emotionally Abused:   [name] Physically Abused:   . Sexually Abused:       Review of Systems  Constitutional: Positive for fatigue. Negative for activity change, chills and unexpected weight change.  HENT: Positive for congestion and postnasal drip. Negative for rhinorrhea, sneezing and sore throat.        Has noted a little blood from each nostril from time to tome.   Respiratory: Positive for shortness of breath. Negative for cough, chest tightness and wheezing.   Cardiovascular: Negative for chest pain and palpitations.  Gastrointestinal: Negative for abdominal pain, constipation, diarrhea, nausea and vomiting.  Endocrine: Negative for cold intolerance, heat intolerance, polydipsia and polyuria.  Musculoskeletal: Negative for arthralgias, back pain, joint swelling and neck pain.  Skin: Negative for rash.  Allergic/Immunologic: Positive for environmental allergies.  Neurological: Positive for dizziness and headaches. Negative for tremors and numbness.  Hematological: Negative for adenopathy. Does not bruise/bleed easily.  Psychiatric/Behavioral: Negative for behavioral problems (Depression), dysphoric mood, sleep disturbance and suicidal ideas.  The patient is nervous/anxious.     Today's Vitals   04/22/20 1345  BP: (!) 142/68  Pulse: 83  Resp: 16  Temp: (!) 97.5 F (36.4 C)  SpO2: 94%  Weight: 181 lb 12.8 oz (82.5 kg)  Height: 4\' 10"  (1.473 m)   Body mass index is 38 kg/m.  Physical Exam Vitals and nursing note reviewed.  Constitutional:      Appearance: Normal appearance. She is well-developed.  HENT:     Head: Normocephalic and atraumatic.     Right Ear: Tympanic membrane is bulging.     Left Ear: Tympanic membrane is bulging.     Nose: Congestion present.     Right Sinus: Frontal sinus tenderness present.     Left Sinus: Frontal sinus tenderness present.     Mouth/Throat:     Pharynx: No oropharyngeal exudate.  Eyes:     Extraocular Movements: Extraocular movements intact.     Conjunctiva/sclera: Conjunctivae normal.     Pupils: Pupils are equal, round, and reactive to light.  Neck:     Thyroid: No thyromegaly.     Vascular: No JVD.     Trachea: No tracheal deviation.  Cardiovascular:     Rate and Rhythm: Normal rate and regular rhythm.     Heart sounds: Murmur heard.   Pulmonary:     Effort: Pulmonary effort is normal.     Breath sounds: Normal breath sounds. No rhonchi or rales.  Abdominal:     Palpations: Abdomen is soft.  Musculoskeletal:        General: Normal range of motion.     Cervical back: Normal range of motion and neck supple.  Lymphadenopathy:     Cervical: No cervical adenopathy.  Skin:    General: Skin is warm and dry.  Neurological:     Mental Status: She is alert and oriented to person, place, and time.  Psychiatric:        Mood and Affect: Mood is anxious and depressed.        Speech: Speech normal.        Behavior: Behavior normal.        Thought Content: Thought content normal.        Judgment: Judgment normal.   Assessment/Plan: 1. Acute non-recurrent frontal sinusitis Start amoxicillin 500mg  three times daily for 10 days. Recommend use of OTC medication as needed and  as indicated for acute symptoms.  - amoxicillin (AMOXIL) 500 MG capsule; Take 1 capsule (500 mg total) by mouth 3 (three) times daily.  Dispense: 30 capsule; Refill: 0  2. Vertigo Add antivert 12.5mg  tablets twice daily as needed for vertigo.  - meclizine (ANTIVERT) 12.5 MG tablet; Take 1 tablet (12.5 mg total) by mouth 2 (two) times daily as needed for dizziness.  Dispense: 30 tablet; Refill: 1  3. 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 exacerbation ([place]) Continue inhaler and respiratory medication as prescribed   4. Oxygen dependent Use oxygen as prescribed   5. GAD (generalized anxiety disorder) May continue alprazolam as needed and as prescribed. Refill as needed.  General Counseling: Atalya verbalizes understanding of the findings of todays visit and agrees with plan of treatment. I have discussed any further diagnostic evaluation that may be needed or ordered today. We also reviewed her medications today. she has been encouraged to call the office with any questions or concerns that should arise related to todays visit.   This patient was seen by [place] with Dr [name] as a part of collaborative care agreement  Meds ordered this encounter  Medications  . amoxicillin (AMOXIL) 500 MG capsule    Sig: Take 1 capsule (500 mg total) by mouth 3 (three) times daily.    Dispense:  30 capsule    Refill:  0    Order Specific Question:   Supervising  Provider    Answer:   [name] [[id number]]  . meclizine (ANTIVERT) 12.5 MG tablet    Sig: Take 1 tablet (12.5 mg total) by mouth 2 (two) times daily as needed for dizziness.    Dispense:  30 tablet    Refill:  1    Order Specific Question:   Supervising Provider    Answer:   [name] [[id number]]    Total time spent: 30 Minutes   Time spent includes review of chart, medications, test results, and follow up plan with the patient.      Dr [name] Internal medicine

## 2020-05-02 DIAGNOSIS — J449 Chronic obstructive pulmonary disease, unspecified: Secondary | ICD-10-CM | POA: Diagnosis not present

## 2020-05-08 ENCOUNTER — Other Ambulatory Visit: Payer: Self-pay | Admitting: Adult Health

## 2020-05-08 ENCOUNTER — Encounter: Payer: Self-pay | Admitting: Hospice and Palliative Medicine

## 2020-05-08 ENCOUNTER — Ambulatory Visit: Payer: Medicare Other | Admitting: Hospice and Palliative Medicine

## 2020-05-08 DIAGNOSIS — Z950 Presence of cardiac pacemaker: Secondary | ICD-10-CM | POA: Diagnosis not present

## 2020-05-08 DIAGNOSIS — Z9981 Dependence on supplemental oxygen: Secondary | ICD-10-CM | POA: Diagnosis not present

## 2020-05-08 DIAGNOSIS — J441 Chronic obstructive pulmonary disease with (acute) exacerbation: Secondary | ICD-10-CM | POA: Diagnosis not present

## 2020-05-08 DIAGNOSIS — G4733 Obstructive sleep apnea (adult) (pediatric): Secondary | ICD-10-CM

## 2020-05-08 DIAGNOSIS — J449 Chronic obstructive pulmonary disease, unspecified: Secondary | ICD-10-CM

## 2020-05-08 DIAGNOSIS — Z9989 Dependence on other enabling machines and devices: Secondary | ICD-10-CM

## 2020-05-08 MED ORDER — AZITHROMYCIN 250 MG PO TABS: ORAL_TABLET | ORAL | 0 refills | Status: DC

## 2020-05-08 MED ORDER — PREDNISONE 10 MG PO TABS: ORAL_TABLET | ORAL | 0 refills | Status: DC

## 2020-05-08 NOTE — Progress Notes (Signed)
[facility] [place], [place] [address]  Internal MEDICINE  Telephone Visit  Patient Name: [name]  [id number]  [id number]  Date of Service: 05/10/2020  I connected with the patient at 1040 by telephone and verified the patients identity using two identifiers.   I discussed the limitations, risks, security and privacy concerns of performing an evaluation and management service by telephone and the availability of in person appointments. I also discussed with the patient that there may be a patient responsible charge related to the service.  The patient expressed understanding and agrees to proceed.    Chief Complaint  Patient presents with  . Telephone Screen  . Telephone Assessment  . Shortness of Breath    not breathing good   . Hyperlipidemia  . Hypertension    HPI Patient is here for sick visit today Last 4 days she has had an increase in shortness of breath. She is having difficulty walking from room to room due to breathing difficulty. She has been monitoring her SpO2 at home and it has been running 85-87% at times when she feels short of breath but after resting SpO2 improves to mid 90's. Currently wearing continuous supplemental oxygen 2.5LPM via [place]. This has limited her daily life. She cannot perform her normal daily tasks due to being short of breath. Coughing has increased, not productive, mainly a dry cough. No reports of leg/ankle swelling. Denies chest pain. She is using her CPAP each night.    S/p pacemaker placed June 2021, has followed with cardiologist since   Current Medication: Outpatient Encounter Medications as of 05/08/2020  Medication Sig  . acetaminophen (TYLENOL) 500 MG tablet Take 500 mg by mouth every 4 (four) hours as needed for moderate pain or headache.   . albuterol (PROVENTIL) (2.5 MG/3ML) 0.083% nebulizer solution Take 3 mLs (2.5 mg total) by nebulization every 6 (six) hours as needed for wheezing or shortness of  breath.  [name] albuterol (VENTOLIN HFA) 108 (90 Base) MCG/ACT inhaler Inhale 2 puffs into the lungs every 6 (six) hours as needed for wheezing or shortness of breath.  . ALPRAZolam (XANAX) 0.25 MG tablet Take 1 tablet (0.25 mg total) by mouth 2 (two) times daily as needed for anxiety.  [name] amoxicillin (AMOXIL) 500 MG capsule Take 1 capsule (500 mg total) by mouth 3 (three) times daily.  [name] atorvastatin (LIPITOR) 40 MG tablet 1 tab po qhs  . bismuth subsalicylate (PEPTO BISMOL) 262 MG/15ML suspension Take 30 mLs by mouth every 6 (six) hours as needed for indigestion or diarrhea or loose stools.  . Calcium Carbonate-Vitamin D (CALCIUM 600+D PO) Take 1 tablet by mouth daily.  . Coenzyme Q10 (COQ10) 200 MG CAPS Take 200 mg by mouth daily.  . diclofenac sodium (VOLTAREN) 1 % GEL To the affected joint (Patient taking differently: Apply 2-4 g topically 3 (three) times daily as needed (shoulder pain.). To the affected joint )  . docusate sodium (COLACE) 50 MG capsule Take 100 mg by mouth at bedtime.  . DULoxetine (CYMBALTA) 60 MG capsule Take 1 capsule (60 mg total) by mouth 2 (two) times daily.  [name] ELIQUIS 5 MG TABS tablet Take 1 tablet by mouth twice daily. (Patient taking differently: Take 5 mg by mouth 2 (two) times daily. )  . ferrous sulfate (FERROUSUL) 325 (65 FE) MG tablet Take 650 mg by mouth daily with breakfast.   . flecainide (TAMBOCOR) 50 MG tablet Take 1 tablet (50 mg total) by mouth 2 (two)  times daily. PLEASE CONTACT OFFICE [phone] TO SCHEDULE APPOINTMENT FOR FUTURE REFILLS  . Ketotifen Fumarate (ALLERGY EYE DROPS OP) Place 1 drop into both eyes daily as needed (allergies).  [name] levothyroxine (SYNTHROID) 25 MCG tablet Take 1 tablet (25 mcg total) by mouth daily before breakfast.  . lisinopril-hydrochlorothiazide (ZESTORETIC) 20-25 MG tablet Take 1 tablet by mouth daily.  [name] loratadine (CLARITIN) 10 MG tablet Take 10 mg by mouth daily.   . meclizine (ANTIVERT) 12.5 MG tablet Take 1 tablet (12.5 mg  total) by mouth 2 (two) times daily as needed for dizziness.  . OXYGEN Inhale into the lungs.  . pantoprazole (PROTONIX) 40 MG tablet Take 1 tablet (40 mg total) by mouth daily.  . potassium chloride (KLOR-CON) 10 MEQ tablet Take 2 tablets (20 mEq total) by mouth 2 (two) times daily.  . sodium chloride (OCEAN) 0.65 % SOLN nasal spray Place 1 spray into both nostrils 2 (two) times daily.   . Tiotropium Bromide-Olodaterol (STIOLTO RESPIMAT) 2.5-2.5 MCG/ACT AERS Inhale 1 puff into the lungs daily.  . traMADol (ULTRAM) 50 MG tablet Take 50 mg by mouth daily as needed for moderate pain.   . traZODone (DESYREL) 50 MG tablet Take 1 tablet (50 mg total) by mouth at bedtime.  . TRELEGY ELLIPTA 100-62.5-25 MCG/INH AEPB INHALE 1 PUFF INTO THE LUNGS DAILY  . triamcinolone (KENALOG) 0.025 % cream Apply 1 application topically daily as needed (itching).   [name] azithromycin (ZITHROMAX) 250 MG tablet Take one tab po qd for 21 days  . hydrALAZINE (APRESOLINE) 25 MG tablet Take 1 tablet (25 mg total) by mouth 3 (three) times daily.  [name] ipratropium-albuterol (DUONEB) 0.5-2.5 (3) MG/3ML SOLN Take 3 mLs by nebulization every 6 (six) hours as needed.  . predniSONE (DELTASONE) 10 MG tablet Take 1 tablet three times a day with a meal for three for three days, take 1 tablet by twice daily with a meal for 3 days, take 1 tablet once daily with a meal for 3 days   No facility-administered encounter medications on file as of 05/08/2020.    Surgical History: Past Surgical History:  Procedure Laterality Date  . ABDOMINAL AORTIC ANEURYSM REPAIR  2008  . ABDOMINAL HYSTERECTOMY    . AORTIC VALVE REPLACEMENT  2008  . CARDIAC CATHETERIZATION     [facility]  . CARDIOVERSION N/A 08/15/2018   Procedure: CARDIOVERSION;  Surgeon: [name], MD;  Location: [facility] ORS;  Service: Cardiovascular;  Laterality: N/A;  . CARDIOVERSION N/A 11/14/2018   Procedure: CARDIOVERSION (CATH LAB);  Surgeon: [name], MD;  Location: [facility] ORS;   Service: Cardiovascular;  Laterality: N/A;  . CARDIOVERSION N/A 06/05/2019   Procedure: CARDIOVERSION;  Surgeon: [name], MD;  Location: [facility] ORS;  Service: Cardiovascular;  Laterality: N/A;  . CARDIOVERSION N/A 08/14/2019   Procedure: CARDIOVERSION;  Surgeon: [name], MD;  Location: [facility] ORS;  Service: Cardiovascular;  Laterality: N/A;  . CARDIOVERSION N/A 11/09/2019   Procedure: CARDIOVERSION;  Surgeon: Isaias Cowman, MD;  Location: [facility] ORS;  Service: Cardiovascular;  Laterality: N/A;  . CARDIOVERSION N/A 01/17/2020   Procedure: CARDIOVERSION;  Surgeon: Isaias Cowman, MD;  Location: [facility] ORS;  Service: Cardiovascular;  Laterality: N/A;  . CARPAL TUNNEL RELEASE    . ELECTROPHYSIOLOGIC STUDY N/A 08/17/2016   Procedure: Cardioversion;  Surgeon: [name], MD;  Location: [facility] ORS;  Service: Cardiovascular;  Laterality: N/A;  . TUMOR EXCISION Left    x3 (arm)    Medical History: Past Medical History:  Diagnosis  Date  . 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 s/p bioprosthetic valve replacement 2008 at [facility];  b. 01/2015 Echo: EF 60-65%, no rwma, Gr1 DD, mildly dil LA, nl RV fxn.  . Atrial fibrillation with RVR ([place]) 01/11/2015  . Atrial flutter ([place])    a. 08/2016 s/p DCCV.  Remains on flecainide 50 mg bid.  . Basal skull fracture ([place]) 20 yrs ago  . Chronic respiratory failure ([place])   . COPD (chronic obstructive pulmonary disease) ([place])    a. on home O2 at 2L since 2008  . 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 Essential hypertension 01/24/2015  . History of cardiac cath    a. 2008 prior to Aortic aneurysm repair-->nl cors.  . History of stress test    a. 10/2015 MV: no ischemia/infarct.  [name] HLD (hyperlipidemia)   . HTN (hypertension)   . Hypothyroidism   . Obesity   . PAF (paroxysmal atrial fibrillation) ([name])    a. on Eliquis; b. CHADS2VASc = 3 (HTN, age x 1, female).  . Paroxysmal atrial fibrillation ([place])  01/24/2015  . Right upper quadrant abdominal tenderness without rebound tenderness 03/02/2018  . S/P ascending aortic aneurysm repair 2008    Family History: Family History  Problem Relation Age of Onset  . Stroke Father   . Stroke Paternal Grandmother     Social History   Socioeconomic History  . Marital status: Single    Spouse name: Not on file  . Number of children: Not on file  . Years of education: Not on file  . Highest education level: Not on file  Occupational History  . Not on file  Tobacco Use  . Smoking status: Former Smoker    Types: Cigarettes  . Smokeless tobacco: Never Used  Vaping Use  . Vaping Use: Never used  Substance and Sexual Activity  . Alcohol use: No  . Drug use: No  . Sexual activity: Never    Birth control/protection: Surgical  Other Topics Concern  . Not on file  Social History Narrative  . Not on file   Social Determinants of Health   Financial Resource Strain:   . Difficulty of Paying Living Expenses: Not on file  Food Insecurity:   . Worried About Charity fundraiser in the Last Year: Not on file  . Ran Out of Food in the Last Year: Not on file  Transportation Needs:   . Lack of Transportation (Medical): Not on file  . Lack of Transportation (Non-Medical): Not on file  Physical Activity:   . Days of Exercise per Week: Not on file  . Minutes of Exercise per Session: Not on file  Stress:   . Feeling of Stress : Not on file  Social Connections:   . Frequency of Communication with Friends and Family: Not on file  . Frequency of Social Gatherings with Friends and Family: Not on file  . Attends Religious Services: Not on file  . Active Member of Clubs or Organizations: Not on file  . Attends Archivist Meetings: Not on file  . Marital Status: Not on file  Intimate Partner Violence:   . Fear of Current or Ex-Partner: Not on file  . Emotionally Abused: Not on file  . Physically Abused: Not on file  . Sexually Abused: Not  on file      Review of Systems  Constitutional: Positive for fatigue. Negative for chills, diaphoresis  and fever.  HENT: Positive for congestion. Negative for postnasal drip, rhinorrhea, sinus pressure, sinus pain, trouble swallowing and voice change.   Eyes: Negative for photophobia, discharge, redness, itching and visual disturbance.  Respiratory: Positive for cough and shortness of breath. Negative for wheezing.   Cardiovascular: Negative for chest pain, palpitations and leg swelling.  Gastrointestinal: Negative for abdominal pain, constipation, diarrhea, nausea and vomiting.  Genitourinary: Negative for dysuria and flank pain.  Musculoskeletal: Negative for arthralgias, back pain, gait problem and neck pain.  Skin: Negative for color change.  Allergic/Immunologic: Negative for environmental allergies and food allergies.  Neurological: Negative for dizziness and headaches.  Hematological: Does not bruise/bleed easily.  Psychiatric/Behavioral: Negative for agitation, behavioral problems (depression) and hallucinations.    Vital Signs: BP (!) 166/93   Pulse 98   Resp 16   Ht 4\' 10"  (1.473 m)   Wt 181 lb (82.1 kg)   SpO2 (!) 87%   BMI 37.83 kg/m    Observation/Objective: No acute distress noted.   Assessment/Plan: 1. COPD with exacerbation ([place]) Acute exacerbation of chronic pulmonary disease. Complete course of extended therapy of azithromycin as well as short course of steroids. Encouraged to monitor symptoms and SpO2 levels. If symptoms worsen or SpO2 levels continue to drop advised her she will need to be seen in hospital. Last CXR 03/29/2020, stable findings. Also sent duo-neb medication to be used in nebulizer. - azithromycin (ZITHROMAX) 250 MG tablet; Take one tab po qd for 21 days  Dispense: 21 tablet; Refill: 0 - predniSONE (DELTASONE) 10 MG tablet; Take 1 tablet three times a day with a meal for three for three days, take 1 tablet by twice daily with a meal for 3  days, take 1 tablet once daily with a meal for 3 days  Dispense: 18 tablet; Refill: 0  2. OSA on CPAP Continue with current CPAP and nightly compliance. Per her request reviewed her download. AHI well controlled and no evidence of leak. May consider overnight pulse oximetry at next visit. May also consider switching to bi-pap with underlying COPD.  3. S/P placement of cardiac pacemaker Extensive cardiac history, discussed this may also be contributing to her increased shortness of breath. Has recently been seen by her cardiologist earlier this month.Echo 02/07/20 EF 68%. Pacemaker placed June 2021, no reported complications.  4. Oxygen dependent Continue with 2.5LPM via [place], advised to increase her LPM as needed based on SpO2 findings. Advised to go to hospital if symptoms worsen or if SpO2 levels continue to drop.  General Counseling: Brietta verbalizes understanding of the findings of today's phone visit and agrees with plan of treatment. I have discussed any further diagnostic evaluation that may be needed or ordered today. We also reviewed her medications today. she has been encouraged to call the office with any questions or concerns that should arise related to todays visit.   Meds ordered this encounter  Medications  . azithromycin (ZITHROMAX) 250 MG tablet    Sig: Take one tab po qd for 21 days    Dispense:  21 tablet    Refill:  0  . predniSONE (DELTASONE) 10 MG tablet    Sig: Take 1 tablet three times a day with a meal for three for three days, take 1 tablet by twice daily with a meal for 3 days, take 1 tablet once daily with a meal for 3 days    Dispense:  18 tablet    Refill:  0  . ipratropium-albuterol (DUONEB) 0.5-2.5 (3) MG/3ML  SOLN    Sig: Take 3 mLs by nebulization every 6 (six) hours as needed.    Dispense:  360 mL    Refill:  2     Time spent:30 Minutes   Tanna Furry. Semira Stoltzfus AGNP-C Internal medicine

## 2020-05-09 ENCOUNTER — Telehealth: Payer: Self-pay | Admitting: Hospice and Palliative Medicine

## 2020-05-09 ENCOUNTER — Encounter: Payer: Self-pay | Admitting: Hospice and Palliative Medicine

## 2020-05-09 MED ORDER — IPRATROPIUM-ALBUTEROL 0.5-2.5 (3) MG/3ML IN SOLN: 3.0000 mL | Freq: Four times a day (QID) | RESPIRATORY_TRACT | 2 refills | Status: DC | PRN

## 2020-05-10 DIAGNOSIS — J449 Chronic obstructive pulmonary disease, unspecified: Secondary | ICD-10-CM | POA: Diagnosis not present

## 2020-05-10 DIAGNOSIS — R0602 Shortness of breath: Secondary | ICD-10-CM | POA: Diagnosis not present

## 2020-05-10 DIAGNOSIS — Z950 Presence of cardiac pacemaker: Secondary | ICD-10-CM | POA: Insufficient documentation

## 2020-05-10 DIAGNOSIS — I509 Heart failure, unspecified: Secondary | ICD-10-CM | POA: Diagnosis not present

## 2020-05-10 DIAGNOSIS — J90 Pleural effusion, not elsewhere classified: Secondary | ICD-10-CM | POA: Diagnosis not present

## 2020-05-10 DIAGNOSIS — Z79899 Other long term (current) drug therapy: Secondary | ICD-10-CM | POA: Diagnosis not present

## 2020-05-10 DIAGNOSIS — J811 Chronic pulmonary edema: Secondary | ICD-10-CM | POA: Diagnosis not present

## 2020-05-11 ENCOUNTER — Other Ambulatory Visit: Payer: Self-pay

## 2020-05-11 ENCOUNTER — Inpatient Hospital Stay
Admission: EM | Admit: 2020-05-11 | Discharge: 2020-05-14 | DRG: 291 | Disposition: A | Discharge status: Home / Self Care | Payer: Medicare Other | Attending: Family Medicine | Admitting: Family Medicine

## 2020-05-11 ENCOUNTER — Emergency Department: Payer: Medicare Other

## 2020-05-11 DIAGNOSIS — E876 Hypokalemia: Secondary | ICD-10-CM | POA: Diagnosis not present

## 2020-05-11 DIAGNOSIS — I110 Hypertensive heart disease with heart failure: Secondary | ICD-10-CM | POA: Diagnosis present

## 2020-05-11 DIAGNOSIS — E039 Hypothyroidism, unspecified: Secondary | ICD-10-CM | POA: Diagnosis present

## 2020-05-11 DIAGNOSIS — E785 Hyperlipidemia, unspecified: Secondary | ICD-10-CM | POA: Diagnosis present

## 2020-05-11 DIAGNOSIS — R9431 Abnormal electrocardiogram [ECG] [EKG]: Secondary | ICD-10-CM

## 2020-05-11 DIAGNOSIS — I447 Left bundle-branch block, unspecified: Secondary | ICD-10-CM | POA: Diagnosis present

## 2020-05-11 DIAGNOSIS — Z20822 Contact with and (suspected) exposure to covid-19: Secondary | ICD-10-CM | POA: Diagnosis present

## 2020-05-11 DIAGNOSIS — Z79899 Other long term (current) drug therapy: Secondary | ICD-10-CM

## 2020-05-11 DIAGNOSIS — J441 Chronic obstructive pulmonary disease with (acute) exacerbation: Secondary | ICD-10-CM | POA: Diagnosis present

## 2020-05-11 DIAGNOSIS — J449 Chronic obstructive pulmonary disease, unspecified: Secondary | ICD-10-CM | POA: Diagnosis present

## 2020-05-11 DIAGNOSIS — Z882 Allergy status to sulfonamides status: Secondary | ICD-10-CM | POA: Diagnosis not present

## 2020-05-11 DIAGNOSIS — J9621 Acute and chronic respiratory failure with hypoxia: Secondary | ICD-10-CM | POA: Diagnosis present

## 2020-05-11 DIAGNOSIS — Z87891 Personal history of nicotine dependence: Secondary | ICD-10-CM

## 2020-05-11 DIAGNOSIS — Z950 Presence of cardiac pacemaker: Secondary | ICD-10-CM

## 2020-05-11 DIAGNOSIS — Z953 Presence of xenogenic heart valve: Secondary | ICD-10-CM

## 2020-05-11 DIAGNOSIS — Z90710 Acquired absence of both cervix and uterus: Secondary | ICD-10-CM

## 2020-05-11 DIAGNOSIS — Z886 Allergy status to analgesic agent status: Secondary | ICD-10-CM

## 2020-05-11 DIAGNOSIS — Z79890 Hormone replacement therapy (postmenopausal): Secondary | ICD-10-CM

## 2020-05-11 DIAGNOSIS — R7989 Other specified abnormal findings of blood chemistry: Secondary | ICD-10-CM | POA: Diagnosis not present

## 2020-05-11 DIAGNOSIS — Z9981 Dependence on supplemental oxygen: Secondary | ICD-10-CM | POA: Diagnosis not present

## 2020-05-11 DIAGNOSIS — Z888 Allergy status to other drugs, medicaments and biological substances status: Secondary | ICD-10-CM | POA: Diagnosis not present

## 2020-05-11 DIAGNOSIS — J90 Pleural effusion, not elsewhere classified: Secondary | ICD-10-CM

## 2020-05-11 DIAGNOSIS — Z823 Family history of stroke: Secondary | ICD-10-CM | POA: Diagnosis not present

## 2020-05-11 DIAGNOSIS — J810 Acute pulmonary edema: Secondary | ICD-10-CM | POA: Diagnosis not present

## 2020-05-11 DIAGNOSIS — R0689 Other abnormalities of breathing: Secondary | ICD-10-CM | POA: Diagnosis not present

## 2020-05-11 DIAGNOSIS — K5900 Constipation, unspecified: Secondary | ICD-10-CM | POA: Diagnosis not present

## 2020-05-11 DIAGNOSIS — R0602 Shortness of breath: Secondary | ICD-10-CM | POA: Diagnosis not present

## 2020-05-11 DIAGNOSIS — Z7951 Long term (current) use of inhaled steroids: Secondary | ICD-10-CM

## 2020-05-11 DIAGNOSIS — I10 Essential (primary) hypertension: Secondary | ICD-10-CM | POA: Diagnosis present

## 2020-05-11 DIAGNOSIS — Z7901 Long term (current) use of anticoagulants: Secondary | ICD-10-CM

## 2020-05-11 DIAGNOSIS — F329 Major depressive disorder, single episode, unspecified: Secondary | ICD-10-CM | POA: Diagnosis present

## 2020-05-11 DIAGNOSIS — F419 Anxiety disorder, unspecified: Secondary | ICD-10-CM | POA: Diagnosis present

## 2020-05-11 DIAGNOSIS — R778 Other specified abnormalities of plasma proteins: Secondary | ICD-10-CM

## 2020-05-11 DIAGNOSIS — I517 Cardiomegaly: Secondary | ICD-10-CM | POA: Diagnosis not present

## 2020-05-11 DIAGNOSIS — I480 Paroxysmal atrial fibrillation: Secondary | ICD-10-CM | POA: Diagnosis present

## 2020-05-11 DIAGNOSIS — I38 Endocarditis, valve unspecified: Secondary | ICD-10-CM | POA: Diagnosis present

## 2020-05-11 DIAGNOSIS — I5033 Acute on chronic diastolic (congestive) heart failure: Secondary | ICD-10-CM | POA: Diagnosis not present

## 2020-05-11 DIAGNOSIS — Z8679 Personal history of other diseases of the circulatory system: Secondary | ICD-10-CM

## 2020-05-11 LAB — MAGNESIUM: Magnesium: 2.1 mg/dL (ref 1.7–2.4)

## 2020-05-11 LAB — CBC WITH DIFFERENTIAL/PLATELET
Abs Immature Granulocytes: 0.04 10*3/uL (ref 0.00–0.07)
Basophils Absolute: 0 10*3/uL (ref 0.0–0.1)
Basophils Relative: 0 %
Eosinophils Absolute: 0 10*3/uL (ref 0.0–0.5)
Eosinophils Relative: 0 %
HCT: 35.6 % — ABNORMAL LOW (ref 36.0–46.0)
Hemoglobin: 11.3 g/dL — ABNORMAL LOW (ref 12.0–15.0)
Immature Granulocytes: 1 %
Lymphocytes Relative: 17 %
Lymphs Abs: 1.3 10*3/uL (ref 0.7–4.0)
MCH: 25.9 pg — ABNORMAL LOW (ref 26.0–34.0)
MCHC: 31.7 g/dL (ref 30.0–36.0)
MCV: 81.5 fL (ref 80.0–100.0)
Monocytes Absolute: 1.1 10*3/uL — ABNORMAL HIGH (ref 0.1–1.0)
Monocytes Relative: 15 %
Neutro Abs: 5.1 10*3/uL (ref 1.7–7.7)
Neutrophils Relative %: 67 %
Platelets: 372 10*3/uL (ref 150–400)
RBC: 4.37 MIL/uL (ref 3.87–5.11)
RDW: 18 % — ABNORMAL HIGH (ref 11.5–15.5)
WBC: 7.6 10*3/uL (ref 4.0–10.5)
nRBC: 0 % (ref 0.0–0.2)

## 2020-05-11 LAB — COMPREHENSIVE METABOLIC PANEL
ALT: 28 U/L (ref 0–44)
AST: 24 U/L (ref 15–41)
Albumin: 3.8 g/dL (ref 3.5–5.0)
Alkaline Phosphatase: 54 U/L (ref 38–126)
Anion gap: 8 (ref 5–15)
BUN: 17 mg/dL (ref 8–23)
CO2: 28 mmol/L (ref 22–32)
Calcium: 9.1 mg/dL (ref 8.9–10.3)
Chloride: 98 mmol/L (ref 98–111)
Creatinine, Ser: 0.77 mg/dL (ref 0.44–1.00)
GFR calc Af Amer: >60 mL/min (ref >60)
GFR calc non Af Amer: >60 mL/min (ref >60)
Glucose, Bld: 106 mg/dL — ABNORMAL HIGH (ref 70–99)
Potassium: 3.5 mmol/L (ref 3.5–5.1)
Sodium: 134 mmol/L — ABNORMAL LOW (ref 135–145)
Total Bilirubin: 0.7 mg/dL (ref 0.3–1.2)
Total Protein: 7 g/dL (ref 6.5–8.1)

## 2020-05-11 LAB — BLOOD GAS, VENOUS
Acid-Base Excess: 4.9 mmol/L — ABNORMAL HIGH (ref 0.0–2.0)
Bicarbonate: 30.4 mmol/L — ABNORMAL HIGH (ref 20.0–28.0)
O2 Saturation: 96.9 %
Patient temperature: 37
pCO2, Ven: 48 mmHg (ref 44.0–60.0)
pH, Ven: 7.41 (ref 7.250–7.430)
pO2, Ven: 89 mmHg — ABNORMAL HIGH (ref 32.0–45.0)

## 2020-05-11 LAB — BRAIN NATRIURETIC PEPTIDE: B Natriuretic Peptide: 1961.1 pg/mL — ABNORMAL HIGH (ref 0.0–100.0)

## 2020-05-11 LAB — TROPONIN I (HIGH SENSITIVITY)
Troponin I (High Sensitivity): 20 ng/L — ABNORMAL HIGH (ref <18)
Troponin I (High Sensitivity): 17 ng/L (ref <18)

## 2020-05-11 LAB — PROCALCITONIN: Procalcitonin: <0.1 ng/mL

## 2020-05-11 LAB — SARS CORONAVIRUS 2 BY RT PCR (HOSPITAL ORDER, PERFORMED IN ~~LOC~~ HOSPITAL LAB): SARS Coronavirus 2: NEGATIVE

## 2020-05-11 MED ORDER — SODIUM CHLORIDE 0.9 % IV SOLN: 250.0000 mL | INTRAVENOUS | Status: DC | PRN

## 2020-05-11 MED ORDER — LORATADINE 10 MG PO TABS
10.0000 mg | ORAL_TABLET | Freq: Every day | ORAL | Status: DC
  Administered 2020-05-12 – 2020-05-14 (×3): 10 mg via ORAL
  Filled 2020-05-11 (×3): qty 1

## 2020-05-11 MED ORDER — TRAZODONE HCL 50 MG PO TABS
50.0000 mg | ORAL_TABLET | Freq: Every day | ORAL | Status: DC
  Administered 2020-05-12 – 2020-05-14 (×3): 50 mg via ORAL
  Filled 2020-05-11 (×3): qty 1

## 2020-05-11 MED ORDER — PANTOPRAZOLE SODIUM 40 MG PO TBEC
40.0000 mg | DELAYED_RELEASE_TABLET | Freq: Every day | ORAL | Status: DC
  Administered 2020-05-12 – 2020-05-14 (×3): 40 mg via ORAL
  Filled 2020-05-11 (×3): qty 1

## 2020-05-11 MED ORDER — DULOXETINE HCL 30 MG PO CPEP
60.0000 mg | ORAL_CAPSULE | Freq: Two times a day (BID) | ORAL | Status: DC
  Administered 2020-05-11 – 2020-05-14 (×6): 60 mg via ORAL
  Filled 2020-05-11: qty 1
  Filled 2020-05-11: qty 2
  Filled 2020-05-11: qty 1
  Filled 2020-05-11 (×3): qty 2

## 2020-05-11 MED ORDER — CALCIUM CARBONATE-VITAMIN D 600-200 MG-UNIT PO TABS: ORAL_TABLET | Freq: Every day | ORAL | Status: DC

## 2020-05-11 MED ORDER — METHYLPREDNISOLONE SODIUM SUCC 125 MG IJ SOLR
125.0000 mg | Freq: Once | INTRAMUSCULAR | Status: AC
  Administered 2020-05-11: 125 mg via INTRAVENOUS
  Filled 2020-05-11: qty 2

## 2020-05-11 MED ORDER — CHOLECALCIFEROL 10 MCG (400 UNIT) PO TABS
200.0000 [IU] | ORAL_TABLET | Freq: Every day | ORAL | Status: DC
  Administered 2020-05-12 – 2020-05-14 (×4): 200 [IU] via ORAL
  Filled 2020-05-11 (×4): qty 1

## 2020-05-11 MED ORDER — BUMETANIDE 0.25 MG/ML IJ SOLN
1.0000 mg | Freq: Once | INTRAMUSCULAR | Status: AC
  Administered 2020-05-11: 1 mg via INTRAVENOUS
  Filled 2020-05-11: qty 4

## 2020-05-11 MED ORDER — TRIAMCINOLONE ACETONIDE 0.025 % EX CREA
TOPICAL_CREAM | Freq: Two times a day (BID) | CUTANEOUS | Status: DC
  Administered 2020-05-12: 1 via TOPICAL
  Filled 2020-05-11: qty 15

## 2020-05-11 MED ORDER — IPRATROPIUM-ALBUTEROL 0.5-2.5 (3) MG/3ML IN SOLN
9.0000 mL | Freq: Once | RESPIRATORY_TRACT | Status: AC
  Administered 2020-05-11: 9 mL via RESPIRATORY_TRACT
  Filled 2020-05-11: qty 3

## 2020-05-11 MED ORDER — UMECLIDINIUM BROMIDE 62.5 MCG/INH IN AEPB
1.0000 | INHALATION_SPRAY | Freq: Every day | RESPIRATORY_TRACT | Status: DC
  Administered 2020-05-12 – 2020-05-14 (×3): 1 via RESPIRATORY_TRACT
  Filled 2020-05-11: qty 7

## 2020-05-11 MED ORDER — BUMETANIDE 0.25 MG/ML IJ SOLN
1.0000 mg | Freq: Two times a day (BID) | INTRAMUSCULAR | Status: DC
  Administered 2020-05-12 – 2020-05-14 (×6): 1 mg via INTRAVENOUS
  Filled 2020-05-11 (×8): qty 4

## 2020-05-11 MED ORDER — FERROUS SULFATE 325 (65 FE) MG PO TABS
650.0000 mg | ORAL_TABLET | Freq: Every day | ORAL | Status: DC
  Administered 2020-05-12 – 2020-05-14 (×3): 650 mg via ORAL
  Filled 2020-05-11 (×4): qty 2

## 2020-05-11 MED ORDER — LEVOTHYROXINE SODIUM 25 MCG PO TABS
25.0000 ug | ORAL_TABLET | Freq: Every day | ORAL | Status: DC
  Administered 2020-05-12 – 2020-05-14 (×3): 25 ug via ORAL
  Filled 2020-05-11 (×3): qty 1

## 2020-05-11 MED ORDER — ONDANSETRON HCL 4 MG/2ML IJ SOLN: 4.0000 mg | Freq: Four times a day (QID) | INTRAMUSCULAR | Status: DC | PRN

## 2020-05-11 MED ORDER — IPRATROPIUM-ALBUTEROL 0.5-2.5 (3) MG/3ML IN SOLN
3.0000 mL | Freq: Four times a day (QID) | RESPIRATORY_TRACT | Status: DC | PRN
  Administered 2020-05-11: 3 mL via RESPIRATORY_TRACT
  Filled 2020-05-11: qty 3

## 2020-05-11 MED ORDER — SODIUM CHLORIDE 0.9% FLUSH: 3.0000 mL | INTRAVENOUS | Status: DC | PRN

## 2020-05-11 MED ORDER — HYDRALAZINE HCL 25 MG PO TABS
25.0000 mg | ORAL_TABLET | Freq: Three times a day (TID) | ORAL | Status: DC
  Administered 2020-05-11 – 2020-05-14 (×9): 25 mg via ORAL
  Filled 2020-05-11 (×9): qty 1

## 2020-05-11 MED ORDER — SODIUM CHLORIDE 0.9 % IV SOLN
500.0000 mg | Freq: Once | INTRAVENOUS | Status: AC
  Administered 2020-05-11: 500 mg via INTRAVENOUS
  Filled 2020-05-11: qty 500

## 2020-05-11 MED ORDER — COQ10 200 MG PO CAPS: 200.0000 mg | ORAL_CAPSULE | Freq: Every day | ORAL | Status: DC

## 2020-05-11 MED ORDER — FLECAINIDE ACETATE 50 MG PO TABS
50.0000 mg | ORAL_TABLET | Freq: Two times a day (BID) | ORAL | Status: DC
  Administered 2020-05-12 – 2020-05-14 (×6): 50 mg via ORAL
  Filled 2020-05-11 (×8): qty 1

## 2020-05-11 MED ORDER — POTASSIUM CHLORIDE CRYS ER 20 MEQ PO TBCR
40.0000 meq | EXTENDED_RELEASE_TABLET | Freq: Once | ORAL | Status: AC
  Administered 2020-05-11: 40 meq via ORAL
  Filled 2020-05-11: qty 2

## 2020-05-11 MED ORDER — MECLIZINE HCL 12.5 MG PO TABS
12.5000 mg | ORAL_TABLET | Freq: Two times a day (BID) | ORAL | Status: DC | PRN
  Filled 2020-05-11: qty 1

## 2020-05-11 MED ORDER — APIXABAN 5 MG PO TABS
5.0000 mg | ORAL_TABLET | Freq: Two times a day (BID) | ORAL | Status: DC
  Administered 2020-05-11 – 2020-05-14 (×6): 5 mg via ORAL
  Filled 2020-05-11 (×6): qty 1

## 2020-05-11 MED ORDER — ACETAMINOPHEN 500 MG PO TABS: 500.0000 mg | ORAL_TABLET | ORAL | Status: DC | PRN

## 2020-05-11 MED ORDER — CALCIUM CARBONATE ANTACID 500 MG PO CHEW
1.0000 | CHEWABLE_TABLET | Freq: Every day | ORAL | Status: DC
  Administered 2020-05-12 – 2020-05-14 (×3): 200 mg via ORAL
  Filled 2020-05-11 (×4): qty 1

## 2020-05-11 MED ORDER — FLUTICASONE FUROATE-VILANTEROL 100-25 MCG/INH IN AEPB
1.0000 | INHALATION_SPRAY | Freq: Every day | RESPIRATORY_TRACT | Status: DC
  Administered 2020-05-12 – 2020-05-14 (×3): 1 via RESPIRATORY_TRACT
  Filled 2020-05-11: qty 28

## 2020-05-11 MED ORDER — ATORVASTATIN CALCIUM 20 MG PO TABS
40.0000 mg | ORAL_TABLET | Freq: Every day | ORAL | Status: DC
  Administered 2020-05-11 – 2020-05-14 (×3): 40 mg via ORAL
  Filled 2020-05-11 (×3): qty 2

## 2020-05-11 MED ORDER — ALPRAZOLAM 0.25 MG PO TABS: 0.2500 mg | ORAL_TABLET | Freq: Two times a day (BID) | ORAL | Status: DC | PRN

## 2020-05-11 MED ORDER — SODIUM CHLORIDE 0.9% FLUSH
3.0000 mL | Freq: Two times a day (BID) | INTRAVENOUS | Status: DC
  Administered 2020-05-12 – 2020-05-14 (×5): 3 mL via INTRAVENOUS

## 2020-05-11 MED ORDER — FLUTICASONE-UMECLIDIN-VILANT 100-62.5-25 MCG/INH IN AEPB: 1.0000 | INHALATION_SPRAY | Freq: Every day | RESPIRATORY_TRACT | Status: DC

## 2020-05-11 MED ORDER — LISINOPRIL 10 MG PO TABS
10.0000 mg | ORAL_TABLET | Freq: Every day | ORAL | Status: DC
  Administered 2020-05-12 – 2020-05-14 (×3): 10 mg via ORAL
  Filled 2020-05-11 (×3): qty 1

## 2020-05-11 MED ORDER — ACETAMINOPHEN 325 MG PO TABS: 650.0000 mg | ORAL_TABLET | ORAL | Status: DC | PRN

## 2020-05-11 NOTE — ED Notes (Signed)
Pt ambulated to toilet. Steady, but desat on ambulation

## 2020-05-11 NOTE — Consult Note (Signed)
[facility] Cardiology  CARDIOLOGY CONSULT NOTE  Patient ID: [name] MRN: [id number] DOB/AGE: 1946/04/14 74 y.o.  Admit date: 05/11/2020 Referring Physician [place] Primary Physician [facility] Primary Cardiologist Danaisha Celli Reason for Consultation congestive heart failure  HPI: 74 year old female referred for evaluation of congestive heart failure.  The patient has a history of aortic stenosis, status post aortic valve replacement and aortic root repair 2008.  Patient has a history of paroxysmal atrial fibrillation/atrial flutter, status post AV nodal ablation and permanent pacemaker implantation 02/07/2020.  Patient has COPD, on chronic oxygen therapy.  He presents with chief complaint of worsening shortness of breath, with chest x-ray revealing bilateral pleural effusions, and CT scan also showing bilateral pleural effusions small-to-moderate in size.  Admission labs notable for elevated BNP of 1961.  Patient treated with intravenous Bumex, since patient has a history of sulfur allergy and develops a rash on furosemide.  2D echocardiogram 02/07/2020 revealed normal left ventricular function, with LVEF greater than 55%.  Review of systems complete and found to be negative unless listed above     Past Medical History:  Diagnosis Date  . 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 s/p bioprosthetic valve replacement 2008 at [facility];  b. 01/2015 Echo: EF 60-65%, no rwma, Gr1 DD, mildly dil LA, nl RV fxn.  . Atrial fibrillation with RVR ([place]) 01/11/2015  . Atrial flutter ([place])    a. 08/2016 s/p DCCV.  Remains on flecainide 50 mg bid.  . Basal skull fracture ([place]) 20 yrs ago  . Chronic respiratory failure ([place])   . COPD (chronic obstructive pulmonary disease) ([place])    a. on home O2 at 2L since 2008  . 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 Essential hypertension 01/24/2015  . History of cardiac cath    a. 2008 prior to Aortic aneurysm repair-->nl cors.  . History of stress test     a. 10/2015 MV: no ischemia/infarct.  [name] HLD (hyperlipidemia)   . HTN (hypertension)   . Hypothyroidism   . Obesity   . PAF (paroxysmal atrial fibrillation) ([name])    a. on Eliquis; b. CHADS2VASc = 3 (HTN, age x 1, female).  . Paroxysmal atrial fibrillation ([place]) 01/24/2015  . Right upper quadrant abdominal tenderness without rebound tenderness 03/02/2018  . S/P ascending aortic aneurysm repair 2008    Past Surgical History:  Procedure Laterality Date  . ABDOMINAL AORTIC ANEURYSM REPAIR  2008  . ABDOMINAL HYSTERECTOMY    . AORTIC VALVE REPLACEMENT  2008  . CARDIAC CATHETERIZATION     [facility]  . CARDIOVERSION N/A 08/15/2018   Procedure: CARDIOVERSION;  Surgeon: [name], MD;  Location: [facility] ORS;  Service: Cardiovascular;  Laterality: N/A;  . CARDIOVERSION N/A 11/14/2018   Procedure: CARDIOVERSION (CATH LAB);  Surgeon: [name], MD;  Location: [facility] ORS;  Service: Cardiovascular;  Laterality: N/A;  . CARDIOVERSION N/A 06/05/2019   Procedure: CARDIOVERSION;  Surgeon: [name], MD;  Location: [facility] ORS;  Service: Cardiovascular;  Laterality: N/A;  . CARDIOVERSION N/A 08/14/2019   Procedure: CARDIOVERSION;  Surgeon: [name], MD;  Location: [facility] ORS;  Service: Cardiovascular;  Laterality: N/A;  . CARDIOVERSION N/A 11/09/2019   Procedure: CARDIOVERSION;  Surgeon: Isaias Cowman, MD;  Location: [facility] ORS;  Service: Cardiovascular;  Laterality: N/A;  . CARDIOVERSION N/A 01/17/2020   Procedure: CARDIOVERSION;  Surgeon: Isaias Cowman, MD;  Location: [facility] ORS;  Service: Cardiovascular;  Laterality: N/A;  . CARPAL  TUNNEL RELEASE    . ELECTROPHYSIOLOGIC STUDY N/A 08/17/2016   Procedure: Cardioversion;  Surgeon: [name], MD;  Location: [facility] ORS;  Service: Cardiovascular;  Laterality: N/A;  . TUMOR EXCISION Left    x3 (arm)    (Not in a hospital admission)  Social History   Socioeconomic History  . Marital status: Single    Spouse name: Not on file   . Number of children: Not on file  . Years of education: Not on file  . Highest education level: Not on file  Occupational History  . Not on file  Tobacco Use  . Smoking status: Former Smoker    Types: Cigarettes  . Smokeless tobacco: Never Used  Vaping Use  . Vaping Use: Never used  Substance and Sexual Activity  . Alcohol use: No  . Drug use: No  . Sexual activity: Never    Birth control/protection: Surgical  Other Topics Concern  . Not on file  Social History Narrative  . Not on file   Social Determinants of Health   Financial Resource Strain:   . Difficulty of Paying Living Expenses: Not on file  Food Insecurity:   . Worried About Charity fundraiser in the Last Year: Not on file  . Ran Out of Food in the Last Year: Not on file  Transportation Needs:   . Lack of Transportation (Medical): Not on file  . Lack of Transportation (Non-Medical): Not on file  Physical Activity:   . Days of Exercise per Week: Not on file  . Minutes of Exercise per Session: Not on file  Stress:   . Feeling of Stress : Not on file  Social Connections:   . Frequency of Communication with Friends and Family: Not on file  . Frequency of Social Gatherings with Friends and Family: Not on file  . Attends Religious Services: Not on file  . Active Member of Clubs or Organizations: Not on file  . Attends Archivist Meetings: Not on file  . Marital Status: Not on file  Intimate Partner Violence:   . Fear of Current or Ex-Partner: Not on file  . Emotionally Abused: Not on file  . Physically Abused: Not on file  . Sexually Abused: Not on file    Family History  Problem Relation Age of Onset  . Stroke Father   . Stroke Paternal Grandmother       Review of systems complete and found to be negative unless listed above      PHYSICAL EXAM  General: Well developed, well nourished, in no acute distress HEENT:  Normocephalic and atramatic Neck:  No JVD.  Lungs: Clear bilaterally to  auscultation and percussion. Heart: HRRR . Normal S1 and S2 without gallops or murmurs.  Abdomen: Bowel sounds are positive, abdomen soft and non-tender  Msk:  Back normal, normal gait. Normal strength and tone for age. Extremities: No clubbing, cyanosis or edema.   Neuro: Alert and oriented X 3. Psych:  Good affect, responds appropriately  Labs:   Lab Results  Component Value Date   WBC 7.6 05/11/2020   HGB 11.3 (L) 05/11/2020   HCT 35.6 (L) 05/11/2020   MCV 81.5 05/11/2020   PLT 372 05/11/2020    Recent Labs  Lab 05/11/20 1219  NA 134*  K 3.5  CL 98  CO2 28  BUN 17  CREATININE 0.77  CALCIUM 9.1  PROT 7.0  BILITOT 0.7  ALKPHOS 54  ALT 28  AST 24  GLUCOSE 106*  Lab Results  Component Value Date   CKTOTAL 107 08/14/2012   CKMB 0.7 08/14/2012   TROPONINI <0.03 10/31/2018    Lab Results  Component Value Date   CHOL 127 07/17/2019   Lab Results  Component Value Date   HDL 53 07/17/2019   Lab Results  Component Value Date   LDLCALC 56 07/17/2019   Lab Results  Component Value Date   TRIG 94 07/17/2019   No results found for: CHOLHDL No results found for: LDLDIRECT    Radiology: DG Chest 1 View  Result Date: 05/11/2020 CLINICAL DATA:  Shortness of breath.  On Lasix. EXAM: CHEST  1 VIEW COMPARISON:  03/29/2020 FINDINGS: Right axillary surgical clips. Pacer. Prior median sternotomy. Patient rotated left. Midline trachea. Moderate cardiomegaly. Left larger than right small pleural effusions, similar. No pneumothorax. The Chin overlies the apices minimally. Interstitial prominence and indistinctness, progressive. Left greater than right base airspace disease, increased. IMPRESSION: Since 03/29/2020, worsened aeration. Cardiomegaly with mild interstitial edema and bilateral pleural effusions. Bibasilar airspace disease which could represent atelectasis or concurrent infection. Aortic Atherosclerosis (ICD10-I70.0). Electronically Signed   By: Abigail Miyamoto M.D.   On:  05/11/2020 12:04   CT Chest Wo Contrast  Result Date: 05/11/2020 CLINICAL DATA:  COPD exacerbation.  Shortness of breath. EXAM: CT CHEST WITHOUT CONTRAST TECHNIQUE: Multidetector CT imaging of the chest was performed following the standard protocol without IV contrast. COMPARISON:  Chest CT dated 09/11/2005. Chest x-ray from earlier same day. FINDINGS: Cardiovascular: Cardiomegaly. No pericardial effusion. Pacemaker/ICD apparatus in place. Aortic root hardware in place. Aortic atherosclerosis. No thoracic aortic aneurysm. Mediastinum/Nodes: No mass or enlarged lymph nodes are seen within the mediastinum. Esophagus is unremarkable. Trachea is unremarkable. Lungs/Pleura: Bilateral pleural effusions, small to moderate in size, at least partially loculated on the RIGHT. Associated compressive atelectasis at each lung base. Emphysematous changes within the upper lobes, mild to moderate in degree. Mild interstitial thickening within the upper lobes suggesting mild edema. Upper Abdomen: No acute findings. Multiple small stones within the otherwise normal appearing gallbladder. Musculoskeletal: Degenerative spondylosis of the kyphotic thoracolumbar spine, mild to moderate in degree. Median sternotomy wires in place. No acute appearing osseous abnormality. IMPRESSION: 1. Bilateral pleural effusions, small to moderate in size, at least partially loculated on the RIGHT. Associated atelectasis at the bilateral lung bases. 2. Mild interstitial edema within the upper lobes. 3. No additional consolidation to suggest pneumonia. 4. Cardiomegaly. 5. Cholelithiasis without evidence of acute cholecystitis. Aortic Atherosclerosis (ICD10-I70.0) and Emphysema (ICD10-J43.9). Electronically Signed   By: Franki Cabot M.D.   On: 05/11/2020 13:51    EKG: Atrial sensing with ventricular pacing  ASSESSMENT AND PLAN:   1.  Respiratory failure, multifactorial, secondary to COPD and acute on chronic diastolic congestive heart failure 2.   Acute on chronic diastolic congestive heart failure, chest x-ray showing bilateral pleural effusions, with Bumex 1 mg IV every 12 3.  Paroxysmal atrial fibrillation, status post ablation and dual-chamber pacemaker implantation, on Eliquis for stroke prevention, and flecainide for rhythm control 4.  COPD, on chronic O2 therapy  Recommendations  1.  Agree with current therapy 2.  Continue diuresis 3.  Carefully monitor renal status 4.  Continue Eliquis for stroke prevention 5.  Continue flecainide for rhythm control    Signed: Isaias Cowman MD,PhD, [facility] 05/11/2020, 4:37 PM

## 2020-05-11 NOTE — ED Notes (Signed)
Awaiting med verification by pharmacy

## 2020-05-11 NOTE — ED Notes (Signed)
Pt on 3L [place] chronically  Pt received 1 duoned by EMS en route  Pt with increased work of breathing last night, increased this AM

## 2020-05-11 NOTE — ED Notes (Signed)
Pt reports generally feeling "unwell"  Denies n/v/d/abdominal pain  Denies increased pain, headache, chest pain, changes to work of breathing. Pt unable to identify what is making her feel "not good"

## 2020-05-11 NOTE — ED Notes (Signed)
Pt reports worsening of breathing following CT. MD notified

## 2020-05-11 NOTE — ED Triage Notes (Signed)
Pt arrives from home via [facility] with complaint of SOB. Pt reports she cannot take her lasix prescription, because she is allergic.   Pt with labored breathing on arrival, a&o x4, 100% 5L [place]  88% RA

## 2020-05-11 NOTE — ED Provider Notes (Signed)
[facility] Emergency Department Provider Note  ____________________________________________   First MD Initiated Contact with Patient 05/11/20 1136     (approximate)  I have reviewed the triage vital signs and the nursing notes.   HISTORY  Chief Complaint Shortness of Breath  [name] is a 74 y.o. female has a history of severe aortic stenosis s/p bioprosthetic AV replacement,  severe COPD on 2-3L [place] at baseline, A-fib on Eliquis s/p multiple cardioversions currently on flecainide,  . The patient has undergone previous cardioversions in 10/2018, 06/2019, and 08/2019. She reported recurrent atrial fibrillation since 10/24/2019. She was evaluated by Dr. [name] at [facility], transiently on flecainide 100 mg twice daily with resultant QRS duration increased from 98 ms to 150 ms, and was therefore decreased to 50 mg twice daily. She is also currently on diltiazem for rate control. The patient is being considered for AV nodal ablation with possible pacema    Past Medical History:  Diagnosis Date  . 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 s/p bioprosthetic valve replacement 2008 at [facility];  b. 01/2015 Echo: EF 60-65%, no rwma, Gr1 DD, mildly dil LA, nl RV fxn.  . Atrial fibrillation with RVR ([place]) 01/11/2015  . Atrial flutter ([place])    a. 08/2016 s/p DCCV.  Remains on flecainide 50 mg bid.  . Basal skull fracture ([place]) 20 yrs ago  . Chronic respiratory failure ([place])   . COPD (chronic obstructive pulmonary disease) ([place])    a. on home O2 at 2L since 2008  . 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 Essential hypertension 01/24/2015  . History of cardiac cath    a. 2008 prior to Aortic aneurysm repair-->nl cors.  . History of stress test    a. 10/2015 MV: no ischemia/infarct.  [name] HLD (hyperlipidemia)   . HTN (hypertension)   . Hypothyroidism   . Obesity   . PAF (paroxysmal atrial fibrillation) ([name])    a. on Eliquis; b. CHADS2VASc = 3  (HTN, age x 1, female).  . Paroxysmal atrial fibrillation ([place]) 01/24/2015  . Right upper quadrant abdominal tenderness without rebound tenderness 03/02/2018  . S/P ascending aortic aneurysm repair 2008    Patient Active Problem List   Diagnosis Date Noted  . Acute on chronic diastolic CHF (congestive heart failure) ([place]) 05/11/2020  . Acute non-recurrent frontal sinusitis 04/22/2020  . Vertigo 04/22/2020  . S/P placement of cardiac pacemaker 02/21/2020  . Episode of moderate major depression ([place]) 10/10/2019  . Persistent atrial fibrillation ([place])   . Encounter for general adult medical examination with abnormal findings 07/10/2019  . Encounter for screening mammogram for malignant neoplasm of breast 07/10/2019  . Atopic dermatitis 05/02/2019  . Paroxysmal atrial flutter ([place]) 08/11/2018  . Encounter for long-term (current) use of medications 07/09/2018  . Chronic left shoulder pain 07/09/2018  . Conjunctivitis 07/09/2018  . Oxygen dependent 07/09/2018  . GAD (generalized anxiety disorder) 07/09/2018  . Ovarian failure 07/09/2018  . Positive colorectal cancer screening using Cologuard test 04/24/2018  . Calculus of gallbladder with acute on chronic cholecystitis 04/08/2018  . H/O: CVA (cerebrovascular accident) 04/08/2018  . Dysuria 03/02/2018  . Urinary tract infection without hematuria 03/02/2018  . Right upper quadrant abdominal tenderness without rebound tenderness 03/02/2018  . 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 exacerbation ([place]) 03/02/2018  . Chronic obstructive pulmonary disease ([place]) 09/19/2017  . Typical atrial flutter ([place])   . Irritable bowel syndrome without  diarrhea 01/29/2015  . Essential hypertension 01/24/2015  . Paroxysmal atrial fibrillation ([place]) 01/24/2015  . History of aortic valve replacement with bioprosthetic valve 01/24/2015  . Atrial fibrillation with RVR ([place]) 01/11/2015  . 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  Impingement syndrome of shoulder 05/18/2014  . Cellulitis and abscess 02/13/2014  . MRSA (methicillin resistant Staphylococcus aureus) 02/13/2014  . Aneurysm, ascending aorta ([place]) 06/23/2013  . Bicuspid aortic valve 06/23/2013    Past Surgical History:  Procedure Laterality Date  . ABDOMINAL AORTIC ANEURYSM REPAIR  2008  . ABDOMINAL HYSTERECTOMY    . AORTIC VALVE REPLACEMENT  2008  . CARDIAC CATHETERIZATION     [facility]  . CARDIOVERSION N/A 08/15/2018   Procedure: CARDIOVERSION;  Surgeon: [name], MD;  Location: [facility] ORS;  Service: Cardiovascular;  Laterality: N/A;  . CARDIOVERSION N/A 11/14/2018   Procedure: CARDIOVERSION (CATH LAB);  Surgeon: [name], MD;  Location: [facility] ORS;  Service: Cardiovascular;  Laterality: N/A;  . CARDIOVERSION N/A 06/05/2019   Procedure: CARDIOVERSION;  Surgeon: [name], MD;  Location: [facility] ORS;  Service: Cardiovascular;  Laterality: N/A;  . CARDIOVERSION N/A 08/14/2019   Procedure: CARDIOVERSION;  Surgeon: [name], MD;  Location: [facility] ORS;  Service: Cardiovascular;  Laterality: N/A;  . CARDIOVERSION N/A 11/09/2019   Procedure: CARDIOVERSION;  Surgeon: Isaias Cowman, MD;  Location: [facility] ORS;  Service: Cardiovascular;  Laterality: N/A;  . CARDIOVERSION N/A 01/17/2020   Procedure: CARDIOVERSION;  Surgeon: Isaias Cowman, MD;  Location: [facility] ORS;  Service: Cardiovascular;  Laterality: N/A;  . CARPAL TUNNEL RELEASE    . ELECTROPHYSIOLOGIC STUDY N/A 08/17/2016   Procedure: Cardioversion;  Surgeon: [name], MD;  Location: [facility] ORS;  Service: Cardiovascular;  Laterality: N/A;  . TUMOR EXCISION Left    x3 (arm)    Prior to Admission medications   Medication Sig Start Date End Date Taking? Authorizing Provider  ethacrynic acid (EDECRIN) 25 MG tablet Take 2 tablets by mouth for 5 days. 05/10/20  Yes [provider]  potassium chloride SA (KLOR-CON) 20 MEQ tablet Take by mouth. 12/09/18  Yes [provider]  acetaminophen (TYLENOL) 500 MG tablet Take 500 mg by mouth every 4 (four) hours as needed for moderate pain or headache.     [provider]  albuterol (PROVENTIL) (2.5 MG/3ML) 0.083% nebulizer solution Take 3 mLs (2.5 mg total) by nebulization every 6 (six) hours as needed for wheezing or shortness of breath. 04/15/20   Kendell Bane, NP  albuterol (VENTOLIN HFA) 108 (90 Base) MCG/ACT inhaler Inhale 2 puffs into the lungs every 6 (six) hours as needed for wheezing or shortness of breath. 04/15/20   Kendell Bane, NP  ALPRAZolam Duanne Moron) 0.25 MG tablet Take 1 tablet (0.25 mg total) by mouth 2 (two) times daily as needed for anxiety. 12/19/19   Ronnell Freshwater, NP  amoxicillin (AMOXIL) 500 MG capsule Take 1 capsule (500 mg total) by mouth 3 (three) times daily. 04/22/20   Ronnell Freshwater, NP  atorvastatin (LIPITOR) 40 MG tablet 1 tab po qhs 03/14/20   Ronnell Freshwater, NP  azithromycin (ZITHROMAX) 250 MG tablet Take one tab po qd for 21 days 05/08/20   Luiz Ochoa, NP  bismuth subsalicylate (PEPTO BISMOL) 262 MG/15ML suspension Take 30 mLs by mouth every 6 (six) hours as needed for indigestion or diarrhea or loose stools.    [provider]  Calcium Carbonate-Vitamin D (CALCIUM 600+D PO) Take 1 tablet  by mouth daily.    [provider]  Coenzyme Q10 (COQ10) 200 MG CAPS Take 200 mg by mouth daily.    [provider]  diclofenac sodium (VOLTAREN) 1 % GEL To the affected joint Patient taking differently: Apply 2-4 g topically 3 (three) times daily as needed (shoulder pain.). To the affected joint  09/17/17   Ronnell Freshwater, NP  docusate sodium (COLACE) 50 MG capsule Take 100 mg by mouth at bedtime.    [provider]  DULoxetine (CYMBALTA) 60 MG capsule Take 1 capsule (60 mg total) by mouth 2 (two) times daily. 03/25/20   Boscia, Greer Ee, NP  ELIQUIS 5 MG TABS tablet Take 1 tablet by mouth twice daily. Patient taking differently: Take 5  mg by mouth 2 (two) times daily.  10/26/19   [name], MD  ferrous sulfate (FERROUSUL) 325 (65 FE) MG tablet Take 650 mg by mouth daily with breakfast.     [provider]  flecainide (TAMBOCOR) 50 MG tablet Take 1 tablet (50 mg total) by mouth 2 (two) times daily. PLEASE CONTACT OFFICE [phone] TO SCHEDULE APPOINTMENT FOR FUTURE REFILLS 02/23/20   Deboraha Sprang, MD  hydrALAZINE (APRESOLINE) 25 MG tablet Take 1 tablet (25 mg total) by mouth 3 (three) times daily. 09/27/19 01/11/20  Theora Gianotti, NP  ipratropium-albuterol (DUONEB) 0.5-2.5 (3) MG/3ML SOLN Take 3 mLs by nebulization every 6 (six) hours as needed. 05/09/20   Luiz Ochoa, NP  Ketotifen Fumarate (ALLERGY EYE DROPS OP) Place 1 drop into both eyes daily as needed (allergies).    [provider]  levothyroxine (SYNTHROID) 25 MCG tablet Take 1 tablet (25 mcg total) by mouth daily before breakfast. 12/27/19   Ronnell Freshwater, NP  lisinopril-hydrochlorothiazide (ZESTORETIC) 20-25 MG tablet Take 1 tablet by mouth daily. 09/27/19   [name], MD  loratadine (CLARITIN) 10 MG tablet Take 10 mg by mouth daily.     [provider]  meclizine (ANTIVERT) 12.5 MG tablet Take 1 tablet (12.5 mg total) by mouth 2 (two) times daily as needed for dizziness. 04/22/20   Ronnell Freshwater, NP  OXYGEN Inhale into the lungs.    [provider]  pantoprazole (PROTONIX) 40 MG tablet Take 1 tablet (40 mg total) by mouth daily. 12/27/19   Ronnell Freshwater, NP  potassium chloride (KLOR-CON) 10 MEQ tablet Take 2 tablets (20 mEq total) by mouth 2 (two) times daily. 12/15/19   Ronnell Freshwater, NP  predniSONE (DELTASONE) 10 MG tablet Take 1 tablet three times a day with a meal for three for three days, take 1 tablet by twice daily with a meal for 3 days, take 1 tablet once daily with a meal for 3 days 05/08/20   Luiz Ochoa, NP  sodium chloride (OCEAN) 0.65 % SOLN nasal spray Place 1 spray into both  nostrils 2 (two) times daily.     [provider]  Tiotropium Bromide-Olodaterol (STIOLTO RESPIMAT) 2.5-2.5 MCG/ACT AERS Inhale 1 puff into the lungs daily. 08/25/19   Kendell Bane, NP  traMADol (ULTRAM) 50 MG tablet Take 50 mg by mouth daily as needed for moderate pain.  12/16/19   [provider]  traZODone (DESYREL) 50 MG tablet Take 1 tablet (50 mg total) by mouth at bedtime. 02/23/20   Ronnell Freshwater, NP  TRELEGY ELLIPTA 100-62.5-25 MCG/INH AEPB INHALE 1 PUFF INTO THE LUNGS DAILY 05/08/20   [name], MD  triamcinolone (KENALOG) 0.025 %  cream Apply 1 application topically daily as needed (itching).  10/13/19   [provider]    Allergies Benadryl [diphenhydramine hcl (sleep)], Cetirizine, Lasix [furosemide], Levaquin [levofloxacin in d5w], Meloxicam, Soy allergy, and Sulfa antibiotics  Family History  Problem Relation Age of Onset  . Stroke Father   . Stroke Paternal Grandmother     Social History Social History   Tobacco Use  . Smoking status: Former Smoker    Types: Cigarettes  . Smokeless tobacco: Never Used  Vaping Use  . Vaping Use: Never used  Substance Use Topics  . Alcohol use: No  . Drug use: No    Review of Systems  Review of Systems  Constitutional: Positive for chills and malaise/fatigue. Negative for fever.  HENT: Negative for sore throat.   Eyes: Negative for pain.  Respiratory: Positive for cough, shortness of breath and wheezing. Negative for stridor.   Cardiovascular: Positive for chest pain ( tightness w/ coughing).  Gastrointestinal: Negative for vomiting.  Skin: Negative for rash.  Neurological: Negative for seizures, loss of consciousness and headaches.  Psychiatric/Behavioral: Negative for suicidal ideas.  All other systems reviewed and are negative.     ____________________________________________   PHYSICAL EXAM:  VITAL SIGNS: ED Triage Vitals  Enc Vitals Group     BP      Pulse      Resp      Temp       Temp src      SpO2      Weight      Height      Head Circumference      Peak Flow      Pain Score      Pain Loc      Pain Edu?      Excl. in [place]?    Vitals:   05/11/20 1137 05/11/20 1138  BP:  (!) 152/86  Pulse:  93  Resp:  19  Temp:  98.3 F (36.8 C)  SpO2: 100% 99%   Physical Exam Vitals and nursing note reviewed.  Constitutional:      General: She is not in acute distress.    Appearance: She is well-developed.  HENT:     Head: Normocephalic and atraumatic.     Right Ear: External ear normal.     Left Ear: External ear normal.     Nose: Nose normal.     Mouth/Throat:     Mouth: Mucous membranes are moist.  Eyes:     Conjunctiva/sclera: Conjunctivae normal.  Cardiovascular:     Rate and Rhythm: Normal rate and regular rhythm.     Pulses: Normal pulses.     Heart sounds: Murmur heard.   Pulmonary:     Effort: Tachypnea and respiratory distress present.     Breath sounds: Examination of the right-middle field reveals wheezing. Examination of the left-middle field reveals wheezing. Examination of the right-lower field reveals wheezing. Examination of the left-lower field reveals wheezing. Wheezing present.  Abdominal:     Palpations: Abdomen is soft.     Tenderness: There is no abdominal tenderness. There is no right CVA tenderness or left CVA tenderness.  Musculoskeletal:     Cervical back: Neck supple.     Right lower leg: No edema.     Left lower leg: No edema.  Skin:    General: Skin is warm and dry.     Capillary Refill: Capillary refill takes less than 2 seconds.  Neurological:     Mental Status: She  is alert and oriented to person, place, and time.  Psychiatric:        Mood and Affect: Mood normal.      ____________________________________________   LABS (all labs ordered are listed, but only abnormal results are displayed)  Labs Reviewed  CBC WITH DIFFERENTIAL/PLATELET - Abnormal; Notable for the following components:      Result Value    Hemoglobin 11.3 (*)    HCT 35.6 (*)    MCH 25.9 (*)    RDW 18.0 (*)    Monocytes Absolute 1.1 (*)    All other components within normal limits  COMPREHENSIVE METABOLIC PANEL - Abnormal; Notable for the following components:   Sodium 134 (*)    Glucose, Bld 106 (*)    All other components within normal limits  BRAIN NATRIURETIC PEPTIDE - Abnormal; Notable for the following components:   B Natriuretic Peptide 1,961.1 (*)    All other components within normal limits  BLOOD GAS, VENOUS - Abnormal; Notable for the following components:   pO2, Ven 89.0 (*)    Bicarbonate 30.4 (*)    Acid-Base Excess 4.9 (*)    All other components within normal limits  TROPONIN I (HIGH SENSITIVITY) - Abnormal; Notable for the following components:   Troponin I (High Sensitivity) 20 (*)    All other components within normal limits  SARS CORONAVIRUS 2 BY RT PCR (HOSPITAL ORDER, [place] [facility])  PROCALCITONIN  MAGNESIUM  TROPONIN I (HIGH SENSITIVITY)   ____________________________________________  EKG  Sinus rhythm with a ventricular rate of 94, left bundle branch block, prolonged QTc interval at 551, and overall very poor tracing in lateral leads V5 and V6. ____________________________________________  RADIOLOGY  ED MD interpretation: Significant bilateral lower lobe atelectasis and cardiomegaly.  Mild pulmonary edema.  Moderate bilateral pleural effusions.  Official radiology report(s): DG Chest 1 View  Result Date: 05/11/2020 CLINICAL DATA:  Shortness of breath.  On Lasix. EXAM: CHEST  1 VIEW COMPARISON:  03/29/2020 FINDINGS: Right axillary surgical clips. Pacer. Prior median sternotomy. Patient rotated left. Midline trachea. Moderate cardiomegaly. Left larger than right small pleural effusions, similar. No pneumothorax. The Chin overlies the apices minimally. Interstitial prominence and indistinctness, progressive. Left greater than right base airspace disease, increased.  IMPRESSION: Since 03/29/2020, worsened aeration. Cardiomegaly with mild interstitial edema and bilateral pleural effusions. Bibasilar airspace disease which could represent atelectasis or concurrent infection. Aortic Atherosclerosis (ICD10-I70.0). Electronically Signed   By: Abigail Miyamoto M.D.   On: 05/11/2020 12:04   CT Chest Wo Contrast  Result Date: 05/11/2020 CLINICAL DATA:  COPD exacerbation.  Shortness of breath. EXAM: CT CHEST WITHOUT CONTRAST TECHNIQUE: Multidetector CT imaging of the chest was performed following the standard protocol without IV contrast. COMPARISON:  Chest CT dated 09/11/2005. Chest x-ray from earlier same day. FINDINGS: Cardiovascular: Cardiomegaly. No pericardial effusion. Pacemaker/ICD apparatus in place. Aortic root hardware in place. Aortic atherosclerosis. No thoracic aortic aneurysm. Mediastinum/Nodes: No mass or enlarged lymph nodes are seen within the mediastinum. Esophagus is unremarkable. Trachea is unremarkable. Lungs/Pleura: Bilateral pleural effusions, small to moderate in size, at least partially loculated on the RIGHT. Associated compressive atelectasis at each lung base. Emphysematous changes within the upper lobes, mild to moderate in degree. Mild interstitial thickening within the upper lobes suggesting mild edema. Upper Abdomen: No acute findings. Multiple small stones within the otherwise normal appearing gallbladder. Musculoskeletal: Degenerative spondylosis of the kyphotic thoracolumbar spine, mild to moderate in degree. Median sternotomy wires in place. No acute appearing osseous abnormality.  IMPRESSION: 1. Bilateral pleural effusions, small to moderate in size, at least partially loculated on the RIGHT. Associated atelectasis at the bilateral lung bases. 2. Mild interstitial edema within the upper lobes. 3. No additional consolidation to suggest pneumonia. 4. Cardiomegaly. 5. Cholelithiasis without evidence of acute cholecystitis. Aortic Atherosclerosis  (ICD10-I70.0) and Emphysema (ICD10-J43.9). Electronically Signed   By: Franki Cabot M.D.   On: 05/11/2020 13:51    ____________________________________________   PROCEDURES  Procedure(s) performed (including Critical Care):  .1-3 Lead EKG Interpretation Performed by: Lucrezia Starch, MD Authorized by: Lucrezia Starch, MD     Interpretation: abnormal     ECG rate assessment: normal     Rhythm: sinus rhythm     Ectopy: PAC     Conduction: abnormal       ____________________________________________   INITIAL IMPRESSION / ASSESSMENT AND PLAN / ED COURSE        Patient presents with [place] to history exam for worsening shortness of breath and acute on chronic hypoxic Rattray failure requiring 5 L nasal cannula to wean her SPO2 saturations in the mid 90s she typically requires 3.  Patient is noted to be wheezing on exam otherwise does not grossly appear volume overloaded.  However he does have evidence of edema on her chest imaging and her BNP is significantly elevated.  Social history empirically for COPD exacerbation she was also given Bumex after discussion with pharmacy given concern for volume overload.  Is adamant that she cannot take Lasix from her sulfa allergy.  She does not appear to have any evidence of hypercarbic respiratory failure on her VBG and her troponin is slightly elevated likely secondary to mild demand ischemia we will plan to obtain a delta troponin.  Procalcitonin is less than 10 and given absence of fever or elevation white blood cell count low suspicion for acute infectious process at this time.  Low suspicion for PE as patient states she is compliant with her Eliquis.  CBC and CMP unremarkable.  CTA does show in addition to evidence of volume overload partially loculated effusion which is likely chronic given patient does not appear acutely infected at this time.  I did discuss patient's presentation and work-up with on-call cardiologist Dr. Saralyn Pilar who agreed  with admission and diuresis.  We will plan to admit to medicine service for further evaluation management. ____________________________________________   FINAL CLINICAL IMPRESSION(S) / ED DIAGNOSES  Final diagnoses:  Shortness of breath  Acute pulmonary edema ([name])  Acute on chronic respiratory failure with hypoxia ([name])  Elevated brain natriuretic peptide (BNP) level  Pleural effusion  Abnormal ECG  Troponin I above reference range    Medications  bumetanide (BUMEX) injection 1 mg (has no administration in time range)  acetaminophen (TYLENOL) tablet 500 mg (has no administration in time range)  atorvastatin (LIPITOR) tablet 40 mg (has no administration in time range)  flecainide (TAMBOCOR) tablet 50 mg (has no administration in time range)  hydrALAZINE (APRESOLINE) tablet 25 mg (has no administration in time range)  ALPRAZolam (XANAX) tablet 0.25 mg (has no administration in time range)  DULoxetine (CYMBALTA) DR capsule 60 mg (has no administration in time range)  traZODone (DESYREL) tablet 50 mg (has no administration in time range)  levothyroxine (SYNTHROID) tablet 25 mcg (has no administration in time range)  meclizine (ANTIVERT) tablet 12.5 mg (has no administration in time range)  pantoprazole (PROTONIX) EC tablet 40 mg (has no administration in time range)  apixaban (ELIQUIS) tablet 5 mg (has no administration in  time range)  ferrous sulfate tablet 650 mg (has no administration in time range)  Calcium Carbonate-Vitamin D 600-200 MG-UNIT TABS (has no administration in time range)  ipratropium-albuterol (DUONEB) 0.5-2.5 (3) MG/3ML nebulizer solution 3 mL (has no administration in time range)  loratadine (CLARITIN) tablet 10 mg (has no administration in time range)  Fluticasone-Umeclidin-Vilant 100-62.5-25 MCG/INH AEPB 1 puff (has no administration in time range)  sodium chloride flush (NS) 0.9 % injection 3 mL (3 mLs Intravenous Not Given 05/11/20 1446)  sodium chloride flush  (NS) 0.9 % injection 3 mL (has no administration in time range)  0.9 %  sodium chloride infusion (has no administration in time range)  acetaminophen (TYLENOL) tablet 650 mg (has no administration in time range)  ondansetron (ZOFRAN) injection 4 mg (has no administration in time range)  lisinopril (ZESTRIL) tablet 10 mg (has no administration in time range)  bumetanide (BUMEX) injection 1 mg (has no administration in time range)  methylPREDNISolone sodium succinate (SOLU-MEDROL) 125 mg/2 mL injection 125 mg (125 mg Intravenous Given 05/11/20 1221)  azithromycin (ZITHROMAX) 500 mg in sodium chloride 0.9 % 250 mL IVPB (0 mg Intravenous Stopped 05/11/20 1446)  ipratropium-albuterol (DUONEB) 0.5-2.5 (3) MG/3ML nebulizer solution 9 mL (9 mLs Nebulization Given 05/11/20 1242)     ED Discharge Orders    None       Note:  This document was prepared using Dragon voice recognition software and may include unintentional dictation errors.   Lucrezia Starch, MD 05/11/20 1501

## 2020-05-11 NOTE — ED Notes (Signed)
[employer] called and stated that the pacemaker check was WNL.

## 2020-05-11 NOTE — H&P (Addendum)
History and Physical    [name] HAL:[id number] DOB: 02-17-1946 DOA: 05/11/2020  PCP: [name], MD   Patient coming from: Home  I have personally briefly reviewed patient's old medical records in [place]  Chief Complaint: Shortness of breath  HPI: [name] is a 74 y.o. female with medical history significant for severe aortic stenosis status post bioprosthetic AV, chronic respiratory failure on 3 L of oxygen continuous, paroxysmal A. fib status post multiple cardioversions on flecainide and on chronic anticoagulation with Eliquis.  She presents to the ER for evaluation of shortness of breath which has progressively worsened over the last couple of days and mostly with exertion.  Patient states that her pulse oximetry drops to the mid 80s on 3 L which is her baseline and has to rest for her pulse oximetry to improve.  Shortness of breath is associated with 2 pillow orthopnea and intermittent chest pain mostly in the lower sternum .  She denies having any fever or chills, no headache, no dizziness, no leg swelling, no lightheadedness, no abdominal pain or changes in her bowel habits. ABG shows pH 7.41, PCO2 48, PO2 89, bicarb 30, O2 sat 96.4 Labs show sodium 134, potassium 3.5, chloride 98, bicarb 28, BUN 17, creatinine 0.7, magnesium 2.1, AST 24, ALT 28, BNP 1961, troponin 20,, white count 7.6, hemoglobin 11.3, hematocrit 35, MCV 81 CT scan of the chest without contrast showed bilateral pleural effusions small to moderate in size.  Mild interstitial edema within the upper lobes.  Cardiomegaly. Chest x-ray reviewed by me reveals cardiomegaly with bilateral pleural effusions. Twelve-lead EKG shows sinus rhythm with left bundle branch block    ED Course: Patient is a 74 year old female with a history of paroxysmal A. fib, aortic stenosis status post aortic valve replacement, chronic respiratory failure who presents to the ER for evaluation of worsening shortness of breath  from baseline associated with hypoxia.  Imaging shows bilateral pleural effusion and patient has elevated BNP levels.  She will be admitted to the hospital for acute on chronic diastolic dysfunction CHF. She received a dose of Bumex in the ER  Review of Systems: As per HPI otherwise 10 point review of systems negative.    Past Medical History:  Diagnosis Date  . 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 s/p bioprosthetic valve replacement 2008 at [facility];  b. 01/2015 Echo: EF 60-65%, no rwma, Gr1 DD, mildly dil LA, nl RV fxn.  . Atrial fibrillation with RVR ([place]) 01/11/2015  . Atrial flutter ([place])    a. 08/2016 s/p DCCV.  Remains on flecainide 50 mg bid.  . Basal skull fracture ([place]) 20 yrs ago  . Chronic respiratory failure ([place])   . COPD (chronic obstructive pulmonary disease) ([place])    a. on home O2 at 2L since 2008  . 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 Essential hypertension 01/24/2015  . History of cardiac cath    a. 2008 prior to Aortic aneurysm repair-->nl cors.  . History of stress test    a. 10/2015 MV: no ischemia/infarct.  [name] HLD (hyperlipidemia)   . HTN (hypertension)   . Hypothyroidism   . Obesity   . PAF (paroxysmal atrial fibrillation) ([name])    a. on Eliquis; b. CHADS2VASc = 3 (HTN, age x 1, female).  . Paroxysmal atrial fibrillation ([place]) 01/24/2015  . Right upper quadrant abdominal tenderness without rebound tenderness 03/02/2018  .  S/P ascending aortic aneurysm repair 2008    Past Surgical History:  Procedure Laterality Date  . ABDOMINAL AORTIC ANEURYSM REPAIR  2008  . ABDOMINAL HYSTERECTOMY    . AORTIC VALVE REPLACEMENT  2008  . CARDIAC CATHETERIZATION     [facility]  . CARDIOVERSION N/A 08/15/2018   Procedure: CARDIOVERSION;  Surgeon: [name], MD;  Location: [facility] ORS;  Service: Cardiovascular;  Laterality: N/A;  . CARDIOVERSION N/A 11/14/2018   Procedure: CARDIOVERSION (CATH LAB);  Surgeon: [name], MD;  Location: [facility]  ORS;  Service: Cardiovascular;  Laterality: N/A;  . CARDIOVERSION N/A 06/05/2019   Procedure: CARDIOVERSION;  Surgeon: [name], MD;  Location: [facility] ORS;  Service: Cardiovascular;  Laterality: N/A;  . CARDIOVERSION N/A 08/14/2019   Procedure: CARDIOVERSION;  Surgeon: [name], MD;  Location: [facility] ORS;  Service: Cardiovascular;  Laterality: N/A;  . CARDIOVERSION N/A 11/09/2019   Procedure: CARDIOVERSION;  Surgeon: Isaias Cowman, MD;  Location: [facility] ORS;  Service: Cardiovascular;  Laterality: N/A;  . CARDIOVERSION N/A 01/17/2020   Procedure: CARDIOVERSION;  Surgeon: Isaias Cowman, MD;  Location: [facility] ORS;  Service: Cardiovascular;  Laterality: N/A;  . CARPAL TUNNEL RELEASE    . ELECTROPHYSIOLOGIC STUDY N/A 08/17/2016   Procedure: Cardioversion;  Surgeon: [name], MD;  Location: [facility] ORS;  Service: Cardiovascular;  Laterality: N/A;  . TUMOR EXCISION Left    x3 (arm)     reports that she has quit smoking. Her smoking use included cigarettes. She has never used smokeless tobacco. She reports that she does not drink alcohol and does not use drugs.  Allergies  Allergen Reactions  . Benadryl [Diphenhydramine Hcl (Sleep)] Palpitations  . Cetirizine Palpitations  . Lasix [Furosemide] Rash  . Levaquin [Levofloxacin In D5w] Other (See Comments)    Reaction:  Fatigue and muscle soreness  . Meloxicam Rash  . Soy Allergy Hives and Nausea And Vomiting  . Sulfa Antibiotics Rash    Family History  Problem Relation Age of Onset  . Stroke Father   . Stroke Paternal Grandmother      Prior to Admission medications   Medication Sig Start Date End Date Taking? Authorizing Provider  acetaminophen (TYLENOL) 500 MG tablet Take 500 mg by mouth every 4 (four) hours as needed for moderate pain or headache.    Yes [provider]  albuterol (PROVENTIL) (2.5 MG/3ML) 0.083% nebulizer solution Take 3 mLs (2.5 mg total) by nebulization every 6 (six) hours as needed  for wheezing or shortness of breath. 04/15/20  Yes Scarboro, Audie Clear, NP  albuterol (VENTOLIN HFA) 108 (90 Base) MCG/ACT inhaler Inhale 2 puffs into the lungs every 6 (six) hours as needed for wheezing or shortness of breath. 04/15/20  Yes Scarboro, Audie Clear, NP  ALPRAZolam Duanne Moron) 0.25 MG tablet Take 1 tablet (0.25 mg total) by mouth 2 (two) times daily as needed for anxiety. 12/19/19  Yes Ronnell Freshwater, NP  atorvastatin (LIPITOR) 40 MG tablet 1 tab po qhs 03/14/20  Yes Boscia, Greer Ee, NP  azithromycin (ZITHROMAX) 250 MG tablet Take one tab po qd for 21 days 05/08/20  Yes Luiz Ochoa, NP  bismuth subsalicylate (PEPTO BISMOL) 262 MG/15ML suspension Take 30 mLs by mouth every 6 (six) hours as needed for indigestion or diarrhea or loose stools.   Yes [provider]  Calcium Carbonate-Vitamin D (CALCIUM 600+D PO) Take 1 tablet by mouth daily.   Yes [provider]  Coenzyme Q10 (COQ10) 200 MG CAPS Take 200 mg by  mouth daily.   Yes [provider]  diclofenac sodium (VOLTAREN) 1 % GEL To the affected joint Patient taking differently: Apply 2-4 g topically 3 (three) times daily as needed (shoulder pain.). To the affected joint  09/17/17  Yes Boscia, Heather E, NP  diltiazem (CARDIZEM CD) 120 MG 24 hr capsule Take 120 mg by mouth daily.   Yes [provider]  docusate sodium (COLACE) 50 MG capsule Take 100 mg by mouth at bedtime.   Yes [provider]  ELIQUIS 5 MG TABS tablet Take 1 tablet by mouth twice daily. Patient taking differently: Take 5 mg by mouth 2 (two) times daily.  10/26/19  Yes [name], MD  ferrous sulfate (FERROUSUL) 325 (65 FE) MG tablet Take 650 mg by mouth daily with breakfast.    Yes [provider]  flecainide (TAMBOCOR) 50 MG tablet Take 1 tablet (50 mg total) by mouth 2 (two) times daily. PLEASE CONTACT OFFICE [phone] TO SCHEDULE APPOINTMENT FOR FUTURE REFILLS 02/23/20  Yes Deboraha Sprang, MD  hydrALAZINE  (APRESOLINE) 25 MG tablet Take 1 tablet (25 mg total) by mouth 3 (three) times daily. 09/27/19 05/11/20 Yes Theora Gianotti, NP  ipratropium-albuterol (DUONEB) 0.5-2.5 (3) MG/3ML SOLN Take 3 mLs by nebulization every 6 (six) hours as needed. 05/09/20  Yes Luiz Ochoa, NP  Ketotifen Fumarate (ALLERGY EYE DROPS OP) Place 1 drop into both eyes daily as needed (allergies).   Yes [provider]  levothyroxine (SYNTHROID) 25 MCG tablet Take 1 tablet (25 mcg total) by mouth daily before breakfast. 12/27/19  Yes Boscia, Heather E, NP  lisinopril-hydrochlorothiazide (ZESTORETIC) 20-25 MG tablet Take 1 tablet by mouth daily. 09/27/19  Yes [name], MD  loratadine (CLARITIN) 10 MG tablet Take 10 mg by mouth daily.    Yes [provider]  meclizine (ANTIVERT) 12.5 MG tablet Take 1 tablet (12.5 mg total) by mouth 2 (two) times daily as needed for dizziness. 04/22/20  Yes Boscia, Heather E, NP  pantoprazole (PROTONIX) 40 MG tablet Take 1 tablet (40 mg total) by mouth daily. 12/27/19  Yes Boscia, Greer Ee, NP  potassium chloride (KLOR-CON) 10 MEQ tablet Take 2 tablets (20 mEq total) by mouth 2 (two) times daily. 12/15/19  Yes Boscia, Heather E, NP  potassium chloride SA (KLOR-CON) 20 MEQ tablet Take by mouth. 12/09/18  Yes [provider]  predniSONE (DELTASONE) 10 MG tablet Take 1 tablet three times a day with a meal for three for three days, take 1 tablet by twice daily with a meal for 3 days, take 1 tablet once daily with a meal for 3 days 05/08/20  Yes Luiz Ochoa, NP  traZODone (DESYREL) 50 MG tablet Take 1 tablet (50 mg total) by mouth at bedtime. 02/23/20  Yes Ronnell Freshwater, NP  TRELEGY ELLIPTA 100-62.5-25 MCG/INH AEPB INHALE 1 PUFF INTO THE LUNGS DAILY 05/08/20  Yes [name], MD  DULoxetine (CYMBALTA) 60 MG capsule Take 1 capsule (60 mg total) by mouth 2 (two) times daily. 03/25/20   Ronnell Freshwater, NP  sodium chloride (OCEAN) 0.65 % SOLN nasal spray Place 1  spray into both nostrils 2 (two) times daily.     [provider]  triamcinolone (KENALOG) 0.025 % cream Apply 1 application topically daily as needed (itching).  10/13/19   [provider]    Physical Exam: Vitals:   05/11/20 1137 05/11/20 1138 05/11/20 1140  BP:  (!) 152/86   Pulse:  93  Resp:  19   Temp:  98.3 F (36.8 C)   TempSrc:  Oral   SpO2: 100% 99%   Weight:   85 kg  Height:   4\' 10"  (1.473 m)     Vitals:   05/11/20 1137 05/11/20 1138 05/11/20 1140  BP:  (!) 152/86   Pulse:  93   Resp:  19   Temp:  98.3 F (36.8 C)   TempSrc:  Oral   SpO2: 100% 99%   Weight:   85 kg  Height:   4\' 10"  (1.473 m)    Constitutional: NAD, alert and oriented x 3 Eyes: PERRL, lids and conjunctivae pallor  ENMT: Mucous membranes are moist.  Neck: normal, supple, no masses, no thyromegaly Respiratory: crackles at the bases , no wheezing.  Conversational dyspnea. No accessory muscle use.  Cardiovascular: Regular rate and rhythm, no murmurs / rubs / gallops. No extremity edema. 2+ pedal pulses. No carotid bruits.  Abdomen: no tenderness, no masses palpated. No hepatosplenomegaly. Bowel sounds positive. Central adiposity Musculoskeletal: no clubbing / cyanosis. No joint deformity upper and lower extremities.  Skin: no rashes, lesions, ulcers.  Neurologic: No gross focal neurologic deficit. Psychiatric: Normal mood and affect.   Labs on Admission: I have personally reviewed following labs and imaging studies  CBC: Recent Labs  Lab 05/11/20 1219  WBC 7.6  NEUTROABS 5.1  HGB 11.3*  HCT 35.6*  MCV 81.5  PLT 831   Basic Metabolic Panel: Recent Labs  Lab 05/11/20 1219  NA 134*  K 3.5  CL 98  CO2 28  GLUCOSE 106*  BUN 17  CREATININE 0.77  CALCIUM 9.1  MG 2.1   GFR: Estimated Creatinine Clearance: 58.7 mL/min (by C-G formula based on SCr of 0.77 mg/dL). Liver Function Tests: Recent Labs  Lab 05/11/20 1219  AST 24  ALT 28  ALKPHOS 54  BILITOT 0.7    PROT 7.0  ALBUMIN 3.8   No results for input(s): LIPASE, AMYLASE in the last 168 hours. No results for input(s): AMMONIA in the last 168 hours. Coagulation Profile: No results for input(s): INR, PROTIME in the last 168 hours. Cardiac Enzymes: No results for input(s): CKTOTAL, CKMB, CKMBINDEX, TROPONINI in the last 168 hours. BNP (last 3 results) No results for input(s): PROBNP in the last 8760 hours. HbA1C: No results for input(s): HGBA1C in the last 72 hours. CBG: No results for input(s): GLUCAP in the last 168 hours. Lipid Profile: No results for input(s): CHOL, HDL, LDLCALC, TRIG, CHOLHDL, LDLDIRECT in the last 72 hours. Thyroid Function Tests: No results for input(s): TSH, T4TOTAL, FREET4, T3FREE, THYROIDAB in the last 72 hours. Anemia Panel: No results for input(s): VITAMINB12, FOLATE, FERRITIN, TIBC, IRON, RETICCTPCT in the last 72 hours. Urine analysis:    Component Value Date/Time   COLORURINE Yellow 10/16/2011 0022   APPEARANCEUR Clear 07/10/2019 1609   LABSPEC 1.009 10/16/2011 0022   PHURINE 7.0 10/16/2011 0022   GLUCOSEU Negative 07/10/2019 1609   GLUCOSEU Negative 10/16/2011 0022   HGBUR Negative 10/16/2011 0022   BILIRUBINUR Negative 07/10/2019 1609   BILIRUBINUR Negative 10/16/2011 0022   KETONESUR Negative 10/16/2011 0022   PROTEINUR Negative 07/10/2019 1609   PROTEINUR Negative 10/16/2011 0022   UROBILINOGEN negative (A) 02/10/2018 1439   NITRITE Negative 07/10/2019 1609   NITRITE Negative 10/16/2011 0022   LEUKOCYTESUR Negative 07/10/2019 1609   LEUKOCYTESUR Trace 10/16/2011 0022    Radiological Exams on Admission: DG Chest 1 View  Result Date: 05/11/2020 CLINICAL DATA:  Shortness of  breath.  On Lasix. EXAM: CHEST  1 VIEW COMPARISON:  03/29/2020 FINDINGS: Right axillary surgical clips. Pacer. Prior median sternotomy. Patient rotated left. Midline trachea. Moderate cardiomegaly. Left larger than right small pleural effusions, similar. No pneumothorax.  The Chin overlies the apices minimally. Interstitial prominence and indistinctness, progressive. Left greater than right base airspace disease, increased. IMPRESSION: Since 03/29/2020, worsened aeration. Cardiomegaly with mild interstitial edema and bilateral pleural effusions. Bibasilar airspace disease which could represent atelectasis or concurrent infection. Aortic Atherosclerosis (ICD10-I70.0). Electronically Signed   By: Abigail Miyamoto M.D.   On: 05/11/2020 12:04   CT Chest Wo Contrast  Result Date: 05/11/2020 CLINICAL DATA:  COPD exacerbation.  Shortness of breath. EXAM: CT CHEST WITHOUT CONTRAST TECHNIQUE: Multidetector CT imaging of the chest was performed following the standard protocol without IV contrast. COMPARISON:  Chest CT dated 09/11/2005. Chest x-ray from earlier same day. FINDINGS: Cardiovascular: Cardiomegaly. No pericardial effusion. Pacemaker/ICD apparatus in place. Aortic root hardware in place. Aortic atherosclerosis. No thoracic aortic aneurysm. Mediastinum/Nodes: No mass or enlarged lymph nodes are seen within the mediastinum. Esophagus is unremarkable. Trachea is unremarkable. Lungs/Pleura: Bilateral pleural effusions, small to moderate in size, at least partially loculated on the RIGHT. Associated compressive atelectasis at each lung base. Emphysematous changes within the upper lobes, mild to moderate in degree. Mild interstitial thickening within the upper lobes suggesting mild edema. Upper Abdomen: No acute findings. Multiple small stones within the otherwise normal appearing gallbladder. Musculoskeletal: Degenerative spondylosis of the kyphotic thoracolumbar spine, mild to moderate in degree. Median sternotomy wires in place. No acute appearing osseous abnormality. IMPRESSION: 1. Bilateral pleural effusions, small to moderate in size, at least partially loculated on the RIGHT. Associated atelectasis at the bilateral lung bases. 2. Mild interstitial edema within the upper lobes. 3. No  additional consolidation to suggest pneumonia. 4. Cardiomegaly. 5. Cholelithiasis without evidence of acute cholecystitis. Aortic Atherosclerosis (ICD10-I70.0) and Emphysema (ICD10-J43.9). Electronically Signed   By: Franki Cabot M.D.   On: 05/11/2020 13:51    EKG: Independently reviewed.  Sinus rhythm Left bundle branch block  Assessment/Plan Principal Problem:   Acute on chronic diastolic CHF (congestive heart failure) ([name]) Active Problems:   Essential hypertension   Paroxysmal atrial fibrillation ([name])   History of aortic valve replacement with bioprosthetic valve   COPD (chronic obstructive pulmonary disease) ([name])   Hypothyroidism     Acute on chronic diastolic CHF Patient had a recent 2D echocardiogram done at [facility] in June which showed an LVEF greater than 55% She presents for evaluation of worsening shortness of breath from her baseline associated with hypoxia on 3 L (pulse oximetry in the mid 80s on 3 L) Chest x-ray shows bilateral pleural effusions and BNP is elevated Start patient on Bumex 1mg  IV IV q 12 Continue lisinopril Patient not on beta-blockers due to acute exacerbation Maintain low-sodium diet and check daily weights    Paroxysmal A. Fib Continue flecainide Continue Eliquis as primary prophylaxis for an acute stroke   History of COPD with chronic respiratory failure Stable and not in acute exacerbation Continue as needed bronchodilator therapy Continue inhaled steroids  Continue home oxygen 3 - 4L to maintain pulse oximetry greater than 92%   History of anxiety and depression Continue trazodone and Xanax   Hypothyroidism Continue Synthroid    DVT prophylaxis: Eliquis Code Status: Full code Family Communication: Greater than 50% of time was spent discussing patient's condition and plan of care with her at the bedside.  All questions and concerns  have been addressed.  She verbalizes understanding and agrees with the  plan. Disposition Plan: Back to previous home environment Consults called: Cardiology    Shevette Bess MD [facility]     05/11/2020, 3:45 PM

## 2020-05-12 DIAGNOSIS — I5033 Acute on chronic diastolic (congestive) heart failure: Secondary | ICD-10-CM

## 2020-05-12 LAB — CBC WITH DIFFERENTIAL/PLATELET
Abs Immature Granulocytes: 0.03 10*3/uL (ref 0.00–0.07)
Basophils Absolute: 0 10*3/uL (ref 0.0–0.1)
Basophils Relative: 0 %
Eosinophils Absolute: 0 10*3/uL (ref 0.0–0.5)
Eosinophils Relative: 0 %
HCT: 34.6 % — ABNORMAL LOW (ref 36.0–46.0)
Hemoglobin: 11 g/dL — ABNORMAL LOW (ref 12.0–15.0)
Immature Granulocytes: 0 %
Lymphocytes Relative: 19 %
Lymphs Abs: 1.4 10*3/uL (ref 0.7–4.0)
MCH: 26.2 pg (ref 26.0–34.0)
MCHC: 31.8 g/dL (ref 30.0–36.0)
MCV: 82.4 fL (ref 80.0–100.0)
Monocytes Absolute: 1.1 10*3/uL — ABNORMAL HIGH (ref 0.1–1.0)
Monocytes Relative: 14 %
Neutro Abs: 4.9 10*3/uL (ref 1.7–7.7)
Neutrophils Relative %: 67 %
Platelets: 340 10*3/uL (ref 150–400)
RBC: 4.2 MIL/uL (ref 3.87–5.11)
RDW: 17.9 % — ABNORMAL HIGH (ref 11.5–15.5)
WBC: 7.4 10*3/uL (ref 4.0–10.5)
nRBC: 0 % (ref 0.0–0.2)

## 2020-05-12 LAB — BASIC METABOLIC PANEL
Anion gap: 9 (ref 5–15)
BUN: 19 mg/dL (ref 8–23)
CO2: 31 mmol/L (ref 22–32)
Calcium: 9.3 mg/dL (ref 8.9–10.3)
Chloride: 95 mmol/L — ABNORMAL LOW (ref 98–111)
Creatinine, Ser: 0.89 mg/dL (ref 0.44–1.00)
GFR calc Af Amer: >60 mL/min (ref >60)
GFR calc non Af Amer: >60 mL/min (ref >60)
Glucose, Bld: 114 mg/dL — ABNORMAL HIGH (ref 70–99)
Potassium: 3.2 mmol/L — ABNORMAL LOW (ref 3.5–5.1)
Sodium: 135 mmol/L (ref 135–145)

## 2020-05-12 MED ORDER — POTASSIUM CHLORIDE CRYS ER 20 MEQ PO TBCR
40.0000 meq | EXTENDED_RELEASE_TABLET | Freq: Once | ORAL | Status: AC
  Administered 2020-05-12: 40 meq via ORAL
  Filled 2020-05-12: qty 2

## 2020-05-12 NOTE — Progress Notes (Signed)
Patient admitted to 2A PCU from ED.  Oriented to room.  Resting in chair without complaints.

## 2020-05-12 NOTE — ED Notes (Signed)
Pt assisted with repositioning in bed, hob elevated and lights on in room. Pt placed hearing aid in ear and puts her glasses on at this time

## 2020-05-12 NOTE — Progress Notes (Signed)
PROGRESS NOTE    Patient: [name]                            PCP: [name], MD                    DOB: 1946-07-21            DOA: 05/11/2020 IOE:[id number]             DOS: 05/12/2020, 7:29 AM   LOS: 1 day   Date of Service: The patient was seen and examined on 05/12/2020  Subjective:   The patient was seen and examined this Am. Stable, denies of having chest pain Still complaining of SOB, on 5 L  O2 satting 98% Otherwise no issues overnight .  Brief Narrative:  HPI:  [name] is a 74 y.o. f w PMH/o  severe aortic stenosis status post bioprosthetic AV, chronic respiratory failure on 3 L of oxygen continuous, paroxysmal A. fib status post multiple cardioversions on flecainide and on chronic anticoagulation with Eliquis.  She presented with shortness of breath which has progressively worsened over the last couple of days and mostly with exertion.   Patient states that her pulse oximetry drops to the mid 80s on 3 L which is her baseline and has to rest for her pulse oximetry to improve.  Shortness of breath is associated with 2 pillow orthopnea and intermittent chest pain mostly in the lower sternum .     ED Course:  ABG shows pH 7.41, PCO2 48, PO2 89, bicarb 30, O2 sat 96.4 Sodium 134, K 3.5, chloride 98, bicarb 28, BUN 17, creatinine 0.7, magnesium 2.1, AST 24, ALT 28, BNP 1961, troponin 20,, white count 7.6, hemoglobin 11.3, hematocrit 35, MCV 81 CT scan of the chest without contrast showed bilateral pleural effusions small to moderate in size.  Mild interstitial edema within the upper lobes.  Cardiomegaly. Chest x-ray reviewed by me reveals cardiomegaly with bilateral pleural effusions. Twelve-lead EKG shows sinus rhythm with left bundle branch block  Admitted to the hospital for acute on chronic diastolic dysfunction CHF. She received a dose of Bumex in the ER    Assessment & Plan:   Principal Problem:   Acute on chronic diastolic CHF (congestive heart failure)  ([name]) Active Problems:   Essential hypertension   Paroxysmal atrial fibrillation ([name])   History of aortic valve replacement with bioprosthetic valve   COPD (chronic obstructive pulmonary disease) ([name])   Hypothyroidism   Acute on chronic diastolic CHF -Currently remained stable, denies any chest pain or shortness of breath -recent 2D echocardi[facility] done at [facility] in June which showed an LVEF greater than 55% - with hypoxia on 3 L (pulse oximetry in the mid 80s on 3 L) -currently requiring 5 L o2  Chest x-ray shows bilateral pleural effusions and BNP is elevated Start patient on Bumex 1mg  IV IV q 12 Continue lisinopril Patient not on beta-blockers due to acute exacerbation Maintain low-sodium diet and check daily weights -Monitor daily weights, I's and O's -Cardiology consulted, appreciate input and assist  Acute on chronic respiratory failure with underlying COPD exacerbated by CHF and pleural effusion -Baseline -chronic O2 demand 3 L -Currently on 3 L was satting mid 80s, currently on 5 L, satting 98% -No wheezing or rhonchi is noted -no signs of COPD exacerbation -Nevertheless continue O2 supplements, DuoNeb bronchodilator and inhalers Scheduled and as needed -  Continue inhaled steroids -Continue treating volume overload with diuretics  Bilateral pleural effusion -We will continue diuresing with Bumex -We will monitor closely   Paroxysmal A. Fib -stable  Continue flecainide Continue Eliquis as primary prophylaxis for an acute stroke   S/P placement of cardiac pacemaker Extensive cardiac history, discussed this may also be contributing to her increased shortness of breath. Has recently been seen by her cardiologist earlier this month.Echo 02/07/20 EF 68%. Pacemaker placed June 2021, no reported complications.      History of anxiety and depression Continue trazodone and Xanax Stable    Hypothyroidism Continue Synthroid -stable          Consultants: Cardiology   ----------------------------------------------------------------------------------------------------------------------------------------------  DVT prophylaxis:  SCD/Compression stockings and ZY:SAYTKZS Code Status:   Code Status: Full Code Family Communication: No family member present at bedside- attempt will be made to update daily The above findings and plan of care has been discussed with patient (and family )  in detail,  they expressed understanding and agreement of above. -Advance care planning has been discussed.   Admission status:    Status is: Inpatient  Remains inpatient appropriate because:Inpatient level of care appropriate due to severity of illness   Dispo: The patient is from: Home              Anticipated d/c is to: Home w Home health               Anticipated d/c date is: 3 days              Patient currently is not medically stable to d/c.        Procedures:   No admission procedures for hospital encounter.     Antimicrobials:  Anti-infectives (From admission, onward)   Start     Dose/Rate Route Frequency Ordered Stop   05/11/20 1200  azithromycin (ZITHROMAX) 500 mg in sodium chloride 0.9 % 250 mL IVPB        500 mg 250 mL/hr over 60 Minutes Intravenous  Once 05/11/20 1150 05/11/20 1446       Medication:  . apixaban  5 mg Oral BID  . atorvastatin  40 mg Oral QHS  . bumetanide (BUMEX) IV  1 mg Intravenous Q12H  . calcium carbonate  1 tablet Oral Daily  . cholecalciferol  200 Units Oral Daily  . DULoxetine  60 mg Oral BID  . ferrous sulfate  650 mg Oral Q breakfast  . flecainide  50 mg Oral BID  . fluticasone furoate-vilanterol  1 puff Inhalation Daily  . hydrALAZINE  25 mg Oral TID  . levothyroxine  25 mcg Oral QAC breakfast  . lisinopril  10 mg Oral Daily  . loratadine  10 mg Oral Daily  . pantoprazole  40 mg Oral Daily  . sodium chloride flush  3 mL Intravenous Q12H  . traZODone  50 mg Oral  QHS  . triamcinolone   Topical BID  . umeclidinium bromide  1 puff Inhalation Daily    sodium chloride, acetaminophen, acetaminophen, ALPRAZolam, ipratropium-albuterol, meclizine, ondansetron (ZOFRAN) IV, sodium chloride flush   Objective:   Vitals:   05/12/20 0330 05/12/20 0500 05/12/20 0600 05/12/20 0630  BP: (!) 144/76 (!) 154/90 140/85   Pulse: 83 88 91 88  Resp: 18 19 19 20   Temp:      TempSrc:      SpO2: 96% 97% 98% 98%  Weight:      Height:       No intake or  output data in the 24 hours ending 05/12/20 0729 Filed Weights   05/11/20 1140  Weight: 85 kg     Examination:   Physical Exam  Constitution:  Alert, cooperative, no distress,  Appears calm and comfortable  Psychiatric: Normal and stable mood and affect, cognition intact,   HEENT: Normocephalic, PERRL, otherwise with in Normal limits  Chest:Chest symmetric Cardio vascular:  S1/S2, RRR, No murmure, No Rubs or Gallops  pulmonary: Clear to auscultation bilaterally, respirations unlabored, negative wheezes / crackles Abdomen: Soft, non-tender, non-distended, bowel sounds,no masses, no organomegaly Muscular skeletal: Limited exam - in bed, able to move all 4 extremities, Normal strength,  Neuro: CNII-XII intact. , normal motor and sensation, reflexes intact  Extremities: No pitting edema lower extremities, +2 pulses  Skin: Dry, warm to touch, negative for any Rashes, No open wounds Wounds: per nursing documentation    ------------------------------------------------------------------------------------------------------------------------------------------    LABs:  CBC Latest Ref Rng & Units 05/11/2020 03/29/2020 08/10/2019  WBC 4.0 - 10.5 K/uL 7.6 8.0 7.0  Hemoglobin 12.0 - 15.0 g/dL 11.3(L) 10.4(L) 10.6(L)  Hematocrit 36 - 46 % 35.6(L) 33.5(L) 33.1(L)  Platelets 150 - 400 K/uL 372 374 308   CMP Latest Ref Rng & Units 05/12/2020 05/11/2020 03/29/2020  Glucose 70 - 99 mg/dL 114(H) 106(H) 123(H)  BUN 8 - 23 mg/dL  19 17 20   Creatinine 0.44 - 1.00 mg/dL 0.89 0.77 0.75  Sodium 135 - 145 mmol/L 135 134(L) 135  Potassium 3.5 - 5.1 mmol/L 3.2(L) 3.5 3.4(L)  Chloride 98 - 111 mmol/L 95(L) 98 98  CO2 22 - 32 mmol/L 31 28 28   Calcium 8.9 - 10.3 mg/dL 9.3 9.1 9.2  Total Protein 6.5 - 8.1 g/dL - 7.0 -  Total Bilirubin 0.3 - 1.2 mg/dL - 0.7 -  Alkaline Phos 38 - 126 U/L - 54 -  AST 15 - 41 U/L - 24 -  ALT 0 - 44 U/L - 28 -       Micro Results Recent Results (from the past 240 hour(s))  SARS Coronavirus 2 by RT PCR (hospital order, performed in [facility] hospital lab) Nasopharyngeal Nasopharyngeal Swab     Status: None   Collection Time: 05/11/20 12:19 PM   Specimen: Nasopharyngeal Swab  Result Value Ref Range Status   SARS Coronavirus 2 NEGATIVE NEGATIVE Final    Comment: (NOTE) SARS-CoV-2 target nucleic acids are NOT DETECTED.  The SARS-CoV-2 RNA is generally detectable in upper and lower respiratory specimens during the acute phase of infection. The lowest concentration of SARS-CoV-2 viral copies this assay can detect is 250 copies / mL. A negative result does not preclude SARS-CoV-2 infection and should not be used as the sole basis for treatment or other patient management decisions.  A negative result may occur with improper specimen collection / handling, submission of specimen other than nasopharyngeal swab, presence of viral mutation(s) within the areas targeted by this assay, and inadequate number of viral copies (<250 copies / mL). A negative result must be combined with clinical observations, patient history, and epidemiological information.  Fact Sheet for Patients:   StrictlyIdeas.no  Fact Sheet for Healthcare Providers: BankingDealers.co.za  This test is not yet approved or  cleared by the [place] FDA and has been authorized for detection and/or diagnosis of SARS-CoV-2 by FDA under an Emergency Use Authorization (EUA).   This EUA will remain in effect (meaning this test can be used) for the duration of the COVID-19 declaration under Section 564(b)(1) of the Act, 21 U.S.C.  section 360bbb-3(b)(1), unless the authorization is terminated or revoked sooner.  Performed at [facility], [address], [place], [place] [address]     Radiology Reports DG Chest 1 View  Result Date: 05/11/2020 CLINICAL DATA:  Shortness of breath.  On Lasix. EXAM: CHEST  1 VIEW COMPARISON:  03/29/2020 FINDINGS: Right axillary surgical clips. Pacer. Prior median sternotomy. Patient rotated left. Midline trachea. Moderate cardiomegaly. Left larger than right small pleural effusions, similar. No pneumothorax. The Chin overlies the apices minimally. Interstitial prominence and indistinctness, progressive. Left greater than right base airspace disease, increased. IMPRESSION: Since 03/29/2020, worsened aeration. Cardiomegaly with mild interstitial edema and bilateral pleural effusions. Bibasilar airspace disease which could represent atelectasis or concurrent infection. Aortic Atherosclerosis (ICD10-I70.0). Electronically Signed   By: Abigail Miyamoto M.D.   On: 05/11/2020 12:04   CT Chest Wo Contrast  Result Date: 05/11/2020 CLINICAL DATA:  COPD exacerbation.  Shortness of breath. EXAM: CT CHEST WITHOUT CONTRAST TECHNIQUE: Multidetector CT imaging of the chest was performed following the standard protocol without IV contrast. COMPARISON:  Chest CT dated 09/11/2005. Chest x-ray from earlier same day. FINDINGS: Cardiovascular: Cardiomegaly. No pericardial effusion. Pacemaker/ICD apparatus in place. Aortic root hardware in place. Aortic atherosclerosis. No thoracic aortic aneurysm. Mediastinum/Nodes: No mass or enlarged lymph nodes are seen within the mediastinum. Esophagus is unremarkable. Trachea is unremarkable. Lungs/Pleura: Bilateral pleural effusions, small to moderate in size, at least partially loculated on the RIGHT. Associated  compressive atelectasis at each lung base. Emphysematous changes within the upper lobes, mild to moderate in degree. Mild interstitial thickening within the upper lobes suggesting mild edema. Upper Abdomen: No acute findings. Multiple small stones within the otherwise normal appearing gallbladder. Musculoskeletal: Degenerative spondylosis of the kyphotic thoracolumbar spine, mild to moderate in degree. Median sternotomy wires in place. No acute appearing osseous abnormality. IMPRESSION: 1. Bilateral pleural effusions, small to moderate in size, at least partially loculated on the RIGHT. Associated atelectasis at the bilateral lung bases. 2. Mild interstitial edema within the upper lobes. 3. No additional consolidation to suggest pneumonia. 4. Cardiomegaly. 5. Cholelithiasis without evidence of acute cholecystitis. Aortic Atherosclerosis (ICD10-I70.0) and Emphysema (ICD10-J43.9). Electronically Signed   By: Franki Cabot M.D.   On: 05/11/2020 13:51    SIGNED: Deatra James, MD, FACP, FHM. [facility],  Pager (please use [email or web address] to [email or web address])  If 7PM-7AM, please contact night-coverage [email or web address] [facility] 05/12/2020, 7:29 AM

## 2020-05-12 NOTE — Progress Notes (Signed)
[facility] Cardiology  SUBJECTIVE: Patient laying flat in bed, states feeling better overall   Vitals:   05/12/20 0330 05/12/20 0500 05/12/20 0600 05/12/20 0630  BP: (!) 144/76 (!) 154/90 140/85   Pulse: 83 88 91 88  Resp: 18 19 19 20   Temp:      TempSrc:      SpO2: 96% 97% 98% 98%  Weight:      Height:        No intake or output data in the 24 hours ending 05/12/20 1051    PHYSICAL EXAM  General: Well developed, well nourished, in no acute distress HEENT:  Normocephalic and atramatic Neck:  No JVD.  Lungs: Clear bilaterally to auscultation and percussion. Heart: HRRR . Normal S1 and S2 without gallops or murmurs.  Abdomen: Bowel sounds are positive, abdomen soft and non-tender  Msk:  Back normal, normal gait. Normal strength and tone for age. Extremities: No clubbing, cyanosis or edema.   Neuro: Alert and oriented X 3. Psych:  Good affect, responds appropriately   LABS: Basic Metabolic Panel: Recent Labs    05/11/20 1219 05/12/20 0551  NA 134* 135  K 3.5 3.2*  CL 98 95*  CO2 28 31  GLUCOSE 106* 114*  BUN 17 19  CREATININE 0.77 0.89  CALCIUM 9.1 9.3  MG 2.1  --    Liver Function Tests: Recent Labs    05/11/20 1219  AST 24  ALT 28  ALKPHOS 54  BILITOT 0.7  PROT 7.0  ALBUMIN 3.8   No results for input(s): LIPASE, AMYLASE in the last 72 hours. CBC: Recent Labs    05/11/20 1219 05/12/20 0815  WBC 7.6 7.4  NEUTROABS 5.1 4.9  HGB 11.3* 11.0*  HCT 35.6* 34.6*  MCV 81.5 82.4  PLT 372 340   Cardiac Enzymes: No results for input(s): CKTOTAL, CKMB, CKMBINDEX, TROPONINI in the last 72 hours. BNP: Invalid input(s): POCBNP D-Dimer: No results for input(s): DDIMER in the last 72 hours. Hemoglobin A1C: No results for input(s): HGBA1C in the last 72 hours. Fasting Lipid Panel: No results for input(s): CHOL, HDL, LDLCALC, TRIG, CHOLHDL, LDLDIRECT in the last 72 hours. Thyroid Function Tests: No results for input(s): TSH, T4TOTAL, T3FREE, THYROIDAB in the  last 72 hours.  Invalid input(s): FREET3 Anemia Panel: No results for input(s): VITAMINB12, FOLATE, FERRITIN, TIBC, IRON, RETICCTPCT in the last 72 hours.  DG Chest 1 View  Result Date: 05/11/2020 CLINICAL DATA:  Shortness of breath.  On Lasix. EXAM: CHEST  1 VIEW COMPARISON:  03/29/2020 FINDINGS: Right axillary surgical clips. Pacer. Prior median sternotomy. Patient rotated left. Midline trachea. Moderate cardiomegaly. Left larger than right small pleural effusions, similar. No pneumothorax. The Chin overlies the apices minimally. Interstitial prominence and indistinctness, progressive. Left greater than right base airspace disease, increased. IMPRESSION: Since 03/29/2020, worsened aeration. Cardiomegaly with mild interstitial edema and bilateral pleural effusions. Bibasilar airspace disease which could represent atelectasis or concurrent infection. Aortic Atherosclerosis (ICD10-I70.0). Electronically Signed   By: Abigail Miyamoto M.D.   On: 05/11/2020 12:04   CT Chest Wo Contrast  Result Date: 05/11/2020 CLINICAL DATA:  COPD exacerbation.  Shortness of breath. EXAM: CT CHEST WITHOUT CONTRAST TECHNIQUE: Multidetector CT imaging of the chest was performed following the standard protocol without IV contrast. COMPARISON:  Chest CT dated 09/11/2005. Chest x-ray from earlier same day. FINDINGS: Cardiovascular: Cardiomegaly. No pericardial effusion. Pacemaker/ICD apparatus in place. Aortic root hardware in place. Aortic atherosclerosis. No thoracic aortic aneurysm. Mediastinum/Nodes: No mass or enlarged lymph nodes are  seen within the mediastinum. Esophagus is unremarkable. Trachea is unremarkable. Lungs/Pleura: Bilateral pleural effusions, small to moderate in size, at least partially loculated on the RIGHT. Associated compressive atelectasis at each lung base. Emphysematous changes within the upper lobes, mild to moderate in degree. Mild interstitial thickening within the upper lobes suggesting mild edema. Upper  Abdomen: No acute findings. Multiple small stones within the otherwise normal appearing gallbladder. Musculoskeletal: Degenerative spondylosis of the kyphotic thoracolumbar spine, mild to moderate in degree. Median sternotomy wires in place. No acute appearing osseous abnormality. IMPRESSION: 1. Bilateral pleural effusions, small to moderate in size, at least partially loculated on the RIGHT. Associated atelectasis at the bilateral lung bases. 2. Mild interstitial edema within the upper lobes. 3. No additional consolidation to suggest pneumonia. 4. Cardiomegaly. 5. Cholelithiasis without evidence of acute cholecystitis. Aortic Atherosclerosis (ICD10-I70.0) and Emphysema (ICD10-J43.9). Electronically Signed   By: Franki Cabot M.D.   On: 05/11/2020 13:51     Echo   TELEMETRY: Atrial sensing with ventricular pacing:  ASSESSMENT AND PLAN:  Principal Problem:   Acute on chronic diastolic CHF (congestive heart failure) ([name]) Active Problems:   Essential hypertension   Paroxysmal atrial fibrillation ([name])   History of aortic valve replacement with bioprosthetic valve   COPD (chronic obstructive pulmonary disease) ([name])   Hypothyroidism    1.  Respiratory failure, multifactorial, secondary to COPD and acute on chronic diastolic congestive heart failure 2.  Acute on chronic diastolic congestive heart failure, chest x-ray showing bilateral pleural effusions, does not appear that fluid overloaded, currently on Bumex 1 mg IV every 12 3.  Paroxysmal atrial fibrillation, status post ablation and dual-chamber pacemaker implantation, on Eliquis for stroke prevention, and flecainide for rhythm control 4.  COPD, on chronic O2 therapy  Recommendations  1.  Agree with current therapy 2.  Continue diuresis 3.  Carefully monitor renal status 4.  Continue Eliquis for stroke prevention 5.  Continue flecainide for rhythm control  Isaias Cowman, MD, PhD, [facility] 05/12/2020 10:51 AM

## 2020-05-12 NOTE — ED Notes (Signed)
Pt In bed with eyes closed, lights off in room

## 2020-05-12 NOTE — ED Notes (Signed)
Pt assisted to restroom by this nurse at this time. Pt stands on own to transfer to wheelchair, rolled to restroom down hall and pt able to ambulate in restroom from wheelchair to toilet to sink and back to wheelchair on own power. Back in bed and placed in position of comfort, pt has no complaints or needs expressed and lights dimmed with call bell at side now.

## 2020-05-13 ENCOUNTER — Encounter: Payer: Self-pay | Admitting: Internal Medicine

## 2020-05-13 ENCOUNTER — Other Ambulatory Visit: Payer: Self-pay

## 2020-05-13 LAB — BASIC METABOLIC PANEL
Anion gap: 8 (ref 5–15)
BUN: 25 mg/dL — ABNORMAL HIGH (ref 8–23)
CO2: 33 mmol/L — ABNORMAL HIGH (ref 22–32)
Calcium: 8.9 mg/dL (ref 8.9–10.3)
Chloride: 97 mmol/L — ABNORMAL LOW (ref 98–111)
Creatinine, Ser: 0.82 mg/dL (ref 0.44–1.00)
GFR calc Af Amer: >60 mL/min (ref >60)
GFR calc non Af Amer: >60 mL/min (ref >60)
Glucose, Bld: 85 mg/dL (ref 70–99)
Potassium: 2.8 mmol/L — ABNORMAL LOW (ref 3.5–5.1)
Sodium: 138 mmol/L (ref 135–145)

## 2020-05-13 LAB — BRAIN NATRIURETIC PEPTIDE: B Natriuretic Peptide: 1048.5 pg/mL — ABNORMAL HIGH (ref 0.0–100.0)

## 2020-05-13 LAB — MAGNESIUM: Magnesium: 2 mg/dL (ref 1.7–2.4)

## 2020-05-13 MED ORDER — DOCUSATE SODIUM 100 MG PO CAPS
200.0000 mg | ORAL_CAPSULE | Freq: Every day | ORAL | Status: DC
  Administered 2020-05-14: 200 mg via ORAL
  Filled 2020-05-13: qty 2

## 2020-05-13 MED ORDER — POTASSIUM CHLORIDE CRYS ER 20 MEQ PO TBCR
40.0000 meq | EXTENDED_RELEASE_TABLET | Freq: Once | ORAL | Status: AC
  Administered 2020-05-13: 40 meq via ORAL
  Filled 2020-05-13: qty 2

## 2020-05-13 MED ORDER — MAGNESIUM SULFATE 2 GM/50ML IV SOLN
2.0000 g | Freq: Once | INTRAVENOUS | Status: AC
  Administered 2020-05-13: 2 g via INTRAVENOUS
  Filled 2020-05-13: qty 50

## 2020-05-13 NOTE — Progress Notes (Signed)
Pt was admitted in the floor with no signs of distress. Pt alert x 4. VSS. Ascom was within pt reach and was educated about safety. Will continue to monitor.

## 2020-05-13 NOTE — Progress Notes (Signed)
PROGRESS NOTE    Patient: [name]                            PCP: [name], MD                    DOB: 10-10-1945            DOA: 05/11/2020 IRJ:[id number]             DOS: 05/13/2020, 11:56 AM   LOS: 2 days   Date of Service: The patient was seen and examined on 05/13/2020  Subjective:   The patient was seen and examined this morning, stable... Reporting that her shortness of breath at rest has improved, but gets extensive with exertion She has been weaned off 5 L of oxygen to her baseline of 3 L of oxygen, satting 95% Denies any chest pain, complaining of weakness.  Brief Narrative:  HPI:  [name] is a 74 y.o. f w PMH/o  severe aortic stenosis status post bioprosthetic AV, chronic respiratory failure on 3 L of oxygen continuous, paroxysmal A. fib status post multiple cardioversions on flecainide and on chronic anticoagulation with Eliquis.  She presented with shortness of breath which has progressively worsened over the last couple of days and mostly with exertion.   Patient states that her pulse oximetry drops to the mid 80s on 3 L which is her baseline and has to rest for her pulse oximetry to improve.  Shortness of breath is associated with 2 pillow orthopnea and intermittent chest pain mostly in the lower sternum .     ED Course:  ABG shows pH 7.41, PCO2 48, PO2 89, bicarb 30, O2 sat 96.4 Sodium 134, K 3.5, chloride 98, bicarb 28, BUN 17, creatinine 0.7, magnesium 2.1, AST 24, ALT 28, BNP 1961, troponin 20,, white count 7.6, hemoglobin 11.3, hematocrit 35, MCV 81 CT scan of the chest without contrast showed bilateral pleural effusions small to moderate in size.  Mild interstitial edema within the upper lobes.  Cardiomegaly. Chest x-ray reviewed by me reveals cardiomegaly with bilateral pleural effusions. Twelve-lead EKG shows sinus rhythm with left bundle branch block  Admitted to the hospital for acute on chronic diastolic dysfunction CHF. She received a dose of  Bumex in the ER    Assessment & Plan:   Principal Problem:   Acute on chronic diastolic CHF (congestive heart failure) ([name]) Active Problems:   Essential hypertension   Paroxysmal atrial fibrillation ([name])   History of aortic valve replacement with bioprosthetic valve   COPD (chronic obstructive pulmonary disease) ([name])   Hypothyroidism   Acute on chronic diastolic CHF -Improved, -She has been weaned off 5 L of oxygen, currently on 3 L, satting 95% -Monitor I's and O's, -1280 today Monitoring daily weight  -recent 2D echocardiogram done at [facility] in June which showed an LVEF greater than 55% - with hypoxia on 3 L (pulse oximetry in the mid 80s on 3 L) -currently requiring 5 L o2  Chest x-ray shows bilateral pleural effusions and BNP is elevated Start patient on Bumex 1mg  IV IV q 12 >> to be continued per cardiology for 1 more day Continue lisinopril Patient not on beta-blockers due to acute exacerbation Maintain low-sodium diet and check daily weights -Cardiology consulted, appreciate input and assist  Acute on chronic respiratory failure with underlying COPD exacerbated by CHF and pleural effusion -Baseline -chronic O2 demand  3 L -Has been tapered off 5 L to 3 L of oxygen to her baseline, satting 95% -No wheezing or rhonchi is noted -no signs of COPD exacerbation -Nevertheless continue O2 supplements, DuoNeb bronchodilator and inhalers Scheduled and as needed -Continue inhaled steroids -Continue treating volume overload with diuretics  Bilateral pleural effusion -We will continue diuresing with Bumex -Stable, O2 demand has improved hypoxia has improved   Paroxysmal A. Fib -  remained stable Continue flecainide Continue Eliquis as primary prophylaxis for an acute stroke   S/P placement of cardiac pacemaker Extensive cardiac history, discussed this may also be contributing to her increased shortness of breath. Has recently been seen by  her cardiologist earlier this month.Echo 02/07/20 EF 68%. Pacemaker placed June 2021, no reported complications.    History of anxiety and depression Continue trazodone and Xanax -Stable in no distress   Hypothyroidism Continue Synthroid -Stable no changes   Hypokalemia/hypomagnesemia -Monitoring, currently K 2.8 >>> 40 meq, repleting orally today With 2 g of magnesium    Consultants: Cardiology   ----------------------------------------------------------------------------------------------------------------------------------------------  DVT prophylaxis:  SCD/Compression stockings and YQ:MVHQION Code Status:   Code Status: Full Code Family Communication: No family member present at bedside- attempt will be made to update daily The above findings and plan of care has been discussed with patient (and family )  in detail,  they expressed understanding and agreement of above. -Advance care planning has been discussed.   Admission status:    Status is: Inpatient  Remains inpatient appropriate because:Inpatient level of care appropriate due to severity of illness   Dispo: The patient is from: Home              Anticipated d/c is to: Home w Home health               Anticipated d/c date is:in AM 05/14/20               Patient currently is not medically stable to d/c.        Procedures:   No admission procedures for hospital encounter.     Antimicrobials:  Anti-infectives (From admission, onward)   Start     Dose/Rate Route Frequency Ordered Stop   05/11/20 1200  azithromycin (ZITHROMAX) 500 mg in sodium chloride 0.9 % 250 mL IVPB        500 mg 250 mL/hr over 60 Minutes Intravenous  Once 05/11/20 1150 05/11/20 1446       Medication:  . apixaban  5 mg Oral BID  . atorvastatin  40 mg Oral QHS  . bumetanide (BUMEX) IV  1 mg Intravenous Q12H  . calcium carbonate  1 tablet Oral Daily  . cholecalciferol  200 Units Oral Daily  . DULoxetine  60 mg Oral BID   . ferrous sulfate  650 mg Oral Q breakfast  . flecainide  50 mg Oral BID  . fluticasone furoate-vilanterol  1 puff Inhalation Daily  . hydrALAZINE  25 mg Oral TID  . levothyroxine  25 mcg Oral QAC breakfast  . lisinopril  10 mg Oral Daily  . loratadine  10 mg Oral Daily  . pantoprazole  40 mg Oral Daily  . sodium chloride flush  3 mL Intravenous Q12H  . traZODone  50 mg Oral QHS  . triamcinolone   Topical BID  . umeclidinium bromide  1 puff Inhalation Daily    sodium chloride, acetaminophen, acetaminophen, ALPRAZolam, ipratropium-albuterol, meclizine, ondansetron (ZOFRAN) IV, sodium chloride flush   Objective:   Vitals:  05/12/20 2015 05/13/20 0508 05/13/20 0639 05/13/20 0749  BP: (!) 146/89 139/88  140/83  Pulse: 94 85  88  Resp:    20  Temp: 97.6 F (36.4 C) 97.9 F (36.6 C)  98.1 F (36.7 C)  TempSrc: Oral Oral  Oral  SpO2: 100% 98% 96% 95%  Weight:      Height:        Intake/Output Summary (Last 24 hours) at 05/13/2020 1156 Last data filed at 05/13/2020 1109 Gross per 24 hour  Intake 360 ml  Output 2200 ml  Net -1840 ml   Filed Weights   05/11/20 1140 05/12/20 1848  Weight: 85 kg 80.4 kg     Examination:       Physical Exam:   General:  Alert, oriented, cooperative, no distress;   HEENT:  Normocephalic, PERRL, otherwise with in Normal limits   Neuro:  CNII-XII intact. , normal motor and sensation, reflexes intact   Lungs:   Clear to auscultation BL, Respirations unlabored, no wheezes / crackles  Cardio:    S1/S2, RRR, No murmure, No Rubs or Gallops   Abdomen:   Soft, non-tender, bowel sounds active all four quadrants,  no guarding or peritoneal signs.  Muscular skeletal:  Limited exam - in bed, able to move all 4 extremities, Normal strength,  2+ pulses,  symmetric, +1 pitting edema  Skin:  Dry, warm to touch, negative for any Rashes, No open wounds  Wounds: Please see nursing documentation          ------------------------------------------------------------------------------------------------------------------------------------------    LABs:  CBC Latest Ref Rng & Units 05/12/2020 05/11/2020 03/29/2020  WBC 4.0 - 10.5 K/uL 7.4 7.6 8.0  Hemoglobin 12.0 - 15.0 g/dL 11.0(L) 11.3(L) 10.4(L)  Hematocrit 36 - 46 % 34.6(L) 35.6(L) 33.5(L)  Platelets 150 - 400 K/uL 340 372 374   CMP Latest Ref Rng & Units 05/13/2020 05/12/2020 05/11/2020  Glucose 70 - 99 mg/dL 85 114(H) 106(H)  BUN 8 - 23 mg/dL 25(H) 19 17  Creatinine 0.44 - 1.00 mg/dL 0.82 0.89 0.77  Sodium 135 - 145 mmol/L 138 135 134(L)  Potassium 3.5 - 5.1 mmol/L 2.8(L) 3.2(L) 3.5  Chloride 98 - 111 mmol/L 97(L) 95(L) 98  CO2 22 - 32 mmol/L 33(H) 31 28  Calcium 8.9 - 10.3 mg/dL 8.9 9.3 9.1  Total Protein 6.5 - 8.1 g/dL - - 7.0  Total Bilirubin 0.3 - 1.2 mg/dL - - 0.7  Alkaline Phos 38 - 126 U/L - - 54  AST 15 - 41 U/L - - 24  ALT 0 - 44 U/L - - 28       Micro Results Recent Results (from the past 240 hour(s))  SARS Coronavirus 2 by RT PCR (hospital order, performed in [facility] hospital lab) Nasopharyngeal Nasopharyngeal Swab     Status: None   Collection Time: 05/11/20 12:19 PM   Specimen: Nasopharyngeal Swab  Result Value Ref Range Status   SARS Coronavirus 2 NEGATIVE NEGATIVE Final    Comment: (NOTE) SARS-CoV-2 target nucleic acids are NOT DETECTED.  The SARS-CoV-2 RNA is generally detectable in upper and lower respiratory specimens during the acute phase of infection. The lowest concentration of SARS-CoV-2 viral copies this assay can detect is 250 copies / mL. A negative result does not preclude SARS-CoV-2 infection and should not be used as the sole basis for treatment or other patient management decisions.  A negative result may occur with improper specimen collection / handling, submission of specimen other than nasopharyngeal swab, presence  of viral mutation(s) within the areas targeted by this assay, and  inadequate number of viral copies (<250 copies / mL). A negative result must be combined with clinical observations, patient history, and epidemiological information.  Fact Sheet for Patients:   StrictlyIdeas.no  Fact Sheet for Healthcare Providers: BankingDealers.co.za  This test is not yet approved or  cleared by the [place] FDA and has been authorized for detection and/or diagnosis of SARS-CoV-2 by FDA under an Emergency Use Authorization (EUA).  This EUA will remain in effect (meaning this test can be used) for the duration of the COVID-19 declaration under Section 564(b)(1) of the Act, 21 U.S.C. section 360bbb-3(b)(1), unless the authorization is terminated or revoked sooner.  Performed at [facility], [address], [place], [place] [address]     Radiology Reports DG Chest 1 View  Result Date: 05/11/2020 CLINICAL DATA:  Shortness of breath.  On Lasix. EXAM: CHEST  1 VIEW COMPARISON:  03/29/2020 FINDINGS: Right axillary surgical clips. Pacer. Prior median sternotomy. Patient rotated left. Midline trachea. Moderate cardiomegaly. Left larger than right small pleural effusions, similar. No pneumothorax. The Chin overlies the apices minimally. Interstitial prominence and indistinctness, progressive. Left greater than right base airspace disease, increased. IMPRESSION: Since 03/29/2020, worsened aeration. Cardiomegaly with mild interstitial edema and bilateral pleural effusions. Bibasilar airspace disease which could represent atelectasis or concurrent infection. Aortic Atherosclerosis (ICD10-I70.0). Electronically Signed   By: Abigail Miyamoto M.D.   On: 05/11/2020 12:04   CT Chest Wo Contrast  Result Date: 05/11/2020 CLINICAL DATA:  COPD exacerbation.  Shortness of breath. EXAM: CT CHEST WITHOUT CONTRAST TECHNIQUE: Multidetector CT imaging of the chest was performed following the standard protocol without IV contrast.  COMPARISON:  Chest CT dated 09/11/2005. Chest x-ray from earlier same day. FINDINGS: Cardiovascular: Cardiomegaly. No pericardial effusion. Pacemaker/ICD apparatus in place. Aortic root hardware in place. Aortic atherosclerosis. No thoracic aortic aneurysm. Mediastinum/Nodes: No mass or enlarged lymph nodes are seen within the mediastinum. Esophagus is unremarkable. Trachea is unremarkable. Lungs/Pleura: Bilateral pleural effusions, small to moderate in size, at least partially loculated on the RIGHT. Associated compressive atelectasis at each lung base. Emphysematous changes within the upper lobes, mild to moderate in degree. Mild interstitial thickening within the upper lobes suggesting mild edema. Upper Abdomen: No acute findings. Multiple small stones within the otherwise normal appearing gallbladder. Musculoskeletal: Degenerative spondylosis of the kyphotic thoracolumbar spine, mild to moderate in degree. Median sternotomy wires in place. No acute appearing osseous abnormality. IMPRESSION: 1. Bilateral pleural effusions, small to moderate in size, at least partially loculated on the RIGHT. Associated atelectasis at the bilateral lung bases. 2. Mild interstitial edema within the upper lobes. 3. No additional consolidation to suggest pneumonia. 4. Cardiomegaly. 5. Cholelithiasis without evidence of acute cholecystitis. Aortic Atherosclerosis (ICD10-I70.0) and Emphysema (ICD10-J43.9). Electronically Signed   By: Franki Cabot M.D.   On: 05/11/2020 13:51    SIGNED: Deatra James, MD, FACP, FHM. [facility],  Pager (please use [email or web address] to page/text)  If 7PM-7AM, please contact night-coverage [email or web address] 05/13/2020, 11:56 AM

## 2020-05-13 NOTE — Progress Notes (Addendum)
  Heart Failure Nurse Navigator Note:  Initial Visit.   Patient with HFpEF.( EF 60 to 65%.)  She presented with complaints of worsening SOB on exertion, which first noted last Sunday.  She also noted PND and orthopnea.  At home her baseline 02 use is at 2 to 3 liters.  She states since this spring she has noted that pushing the grocery cart around the store is not a problem but finds that she is unable to carry the groceries in the house due to increasing SOB.  She states that her medications come in pill packs and that she has not missed any of her medications.  She admitted to eating ramen noodles for several days prior to admission.  Otherwise she does not  cook with salt nor add it at the table.  She reads labels.  Her 68 year old granddaughter lives with her.  Comorbidities include:  Severe COPD PAF with RVR Aortic stenosis s/p bioprosthetic AVR  Labs:  BNP- 1,048 (on admission-1961)  Sodium 138, potassium 2.8, BUN 25, creatinine 0.82.  Medications:  Lisinopril/HCTZ 20/25 mg daily Potassium 20 meq daily Hydralazine 25 mg TID Eliquis 5 mg BID Flecainide 50 mg BID Lipitor 40 mg daily   Currently receiving Bumex 1 mg IV q 12 hours  Discussed sign and symptoms to report to doctor.  Also given heart failure booklet and magnet. Discussed also importance of weighing daily, without clothes or the same amount of clothing and recording on calendar. Reporting 2 to 3 pound weight gain over night or 5 pounds within a week.  Will continue to follow during this hospitalization.  Pricilla Riffle RN, CHFN

## 2020-05-13 NOTE — Progress Notes (Signed)
[facility] Cardiology  SUBJECTIVE: Patient sitting in chair, reports feeling better overall, less shortness of breath   Vitals:   05/12/20 2015 05/13/20 0508 05/13/20 0639 05/13/20 0749  BP: (!) 146/89 139/88  140/83  Pulse: 94 85  88  Resp:    20  Temp: 97.6 F (36.4 C) 97.9 F (36.6 C)  98.1 F (36.7 C)  TempSrc: Oral Oral  Oral  SpO2: 100% 98% 96% 95%  Weight:      Height:         Intake/Output Summary (Last 24 hours) at 05/13/2020 1007 Last data filed at 05/13/2020 4403 Gross per 24 hour  Intake 120 ml  Output 1850 ml  Net -1730 ml      PHYSICAL EXAM  General: Well developed, well nourished, in no acute distress HEENT:  Normocephalic and atramatic Neck:  No JVD.  Lungs: Clear bilaterally to auscultation and percussion. Heart: HRRR . Normal S1 and S2 without gallops or murmurs.  Abdomen: Bowel sounds are positive, abdomen soft and non-tender  Msk:  Back normal, normal gait. Normal strength and tone for age. Extremities: No clubbing, cyanosis or edema.   Neuro: Alert and oriented X 3. Psych:  Good affect, responds appropriately   LABS: Basic Metabolic Panel: Recent Labs    05/11/20 1219 05/11/20 1219 05/12/20 0551 05/13/20 0549  NA 134*   < > 135 138  K 3.5   < > 3.2* 2.8*  CL 98   < > 95* 97*  CO2 28   < > 31 33*  GLUCOSE 106*   < > 114* 85  BUN 17   < > 19 25*  CREATININE 0.77   < > 0.89 0.82  CALCIUM 9.1   < > 9.3 8.9  MG 2.1  --   --   --    < > = values in this interval not displayed.   Liver Function Tests: Recent Labs    05/11/20 1219  AST 24  ALT 28  ALKPHOS 54  BILITOT 0.7  PROT 7.0  ALBUMIN 3.8   No results for input(s): LIPASE, AMYLASE in the last 72 hours. CBC: Recent Labs    05/11/20 1219 05/12/20 0815  WBC 7.6 7.4  NEUTROABS 5.1 4.9  HGB 11.3* 11.0*  HCT 35.6* 34.6*  MCV 81.5 82.4  PLT 372 340   Cardiac Enzymes: No results for input(s): CKTOTAL, CKMB, CKMBINDEX, TROPONINI in the last 72 hours. BNP: Invalid input(s):  POCBNP D-Dimer: No results for input(s): DDIMER in the last 72 hours. Hemoglobin A1C: No results for input(s): HGBA1C in the last 72 hours. Fasting Lipid Panel: No results for input(s): CHOL, HDL, LDLCALC, TRIG, CHOLHDL, LDLDIRECT in the last 72 hours. Thyroid Function Tests: No results for input(s): TSH, T4TOTAL, T3FREE, THYROIDAB in the last 72 hours.  Invalid input(s): FREET3 Anemia Panel: No results for input(s): VITAMINB12, FOLATE, FERRITIN, TIBC, IRON, RETICCTPCT in the last 72 hours.  DG Chest 1 View  Result Date: 05/11/2020 CLINICAL DATA:  Shortness of breath.  On Lasix. EXAM: CHEST  1 VIEW COMPARISON:  03/29/2020 FINDINGS: Right axillary surgical clips. Pacer. Prior median sternotomy. Patient rotated left. Midline trachea. Moderate cardiomegaly. Left larger than right small pleural effusions, similar. No pneumothorax. The Chin overlies the apices minimally. Interstitial prominence and indistinctness, progressive. Left greater than right base airspace disease, increased. IMPRESSION: Since 03/29/2020, worsened aeration. Cardiomegaly with mild interstitial edema and bilateral pleural effusions. Bibasilar airspace disease which could represent atelectasis or concurrent infection. Aortic Atherosclerosis (ICD10-I70.0). Electronically Signed  By: Abigail Miyamoto M.D.   On: 05/11/2020 12:04   CT Chest Wo Contrast  Result Date: 05/11/2020 CLINICAL DATA:  COPD exacerbation.  Shortness of breath. EXAM: CT CHEST WITHOUT CONTRAST TECHNIQUE: Multidetector CT imaging of the chest was performed following the standard protocol without IV contrast. COMPARISON:  Chest CT dated 09/11/2005. Chest x-ray from earlier same day. FINDINGS: Cardiovascular: Cardiomegaly. No pericardial effusion. Pacemaker/ICD apparatus in place. Aortic root hardware in place. Aortic atherosclerosis. No thoracic aortic aneurysm. Mediastinum/Nodes: No mass or enlarged lymph nodes are seen within the mediastinum. Esophagus is  unremarkable. Trachea is unremarkable. Lungs/Pleura: Bilateral pleural effusions, small to moderate in size, at least partially loculated on the RIGHT. Associated compressive atelectasis at each lung base. Emphysematous changes within the upper lobes, mild to moderate in degree. Mild interstitial thickening within the upper lobes suggesting mild edema. Upper Abdomen: No acute findings. Multiple small stones within the otherwise normal appearing gallbladder. Musculoskeletal: Degenerative spondylosis of the kyphotic thoracolumbar spine, mild to moderate in degree. Median sternotomy wires in place. No acute appearing osseous abnormality. IMPRESSION: 1. Bilateral pleural effusions, small to moderate in size, at least partially loculated on the RIGHT. Associated atelectasis at the bilateral lung bases. 2. Mild interstitial edema within the upper lobes. 3. No additional consolidation to suggest pneumonia. 4. Cardiomegaly. 5. Cholelithiasis without evidence of acute cholecystitis. Aortic Atherosclerosis (ICD10-I70.0) and Emphysema (ICD10-J43.9). Electronically Signed   By: Franki Cabot M.D.   On: 05/11/2020 13:51     Echo   TELEMETRY: Atrial sensing and ventricular pacing:  ASSESSMENT AND PLAN:  Principal Problem:   Acute on chronic diastolic CHF (congestive heart failure) ([name]) Active Problems:   Essential hypertension   Paroxysmal atrial fibrillation ([name])   History of aortic valve replacement with bioprosthetic valve   COPD (chronic obstructive pulmonary disease) ([name])   Hypothyroidism    1. Respiratory failure, multifactorial, secondary to COPD and acute on chronic diastolic congestive heart failure, slowly improving 2.Acute on chronic diastolic congestive heart failure, chest x-ray showing bilateral pleural effusions, does not appear that fluid overloaded, currently on Bumex 1 mg IV every 12 3.Paroxysmal atrial fibrillation, status post ablation and dual-chamber pacemaker implantation, on  Eliquis for stroke prevention, and flecainide for rhythm control 4.COPD, on chronic O2 therapy  Recommendations  1.Agree with current therapy 2.Continue diuresis 3.Carefully monitor renal status 4.Continue Eliquis for stroke prevention 5.Continue flecainide for rhythm control   Isaias Cowman, MD, PhD, [facility] 05/13/2020 10:07 AM

## 2020-05-13 NOTE — Plan of Care (Signed)
  Problem: Health Behavior/Discharge Planning: Goal: Ability to manage health-related needs will improve Outcome: Progressing   Problem: Clinical Measurements: Goal: Respiratory complications will improve Outcome: Progressing Goal: Cardiovascular complication will be avoided Outcome: Progressing   Problem: Elimination: Goal: Will not experience complications related to urinary retention Outcome: Progressing   Problem: Safety: Goal: Ability to remain free from injury will improve Outcome: Progressing

## 2020-05-14 ENCOUNTER — Telehealth: Payer: Self-pay

## 2020-05-14 LAB — BASIC METABOLIC PANEL
Anion gap: 9 (ref 5–15)
BUN: 25 mg/dL — ABNORMAL HIGH (ref 8–23)
CO2: 32 mmol/L (ref 22–32)
Calcium: 8.6 mg/dL — ABNORMAL LOW (ref 8.9–10.3)
Chloride: 95 mmol/L — ABNORMAL LOW (ref 98–111)
Creatinine, Ser: 0.81 mg/dL (ref 0.44–1.00)
GFR calc Af Amer: >60 mL/min (ref >60)
GFR calc non Af Amer: >60 mL/min (ref >60)
Glucose, Bld: 93 mg/dL (ref 70–99)
Potassium: 2.8 mmol/L — ABNORMAL LOW (ref 3.5–5.1)
Sodium: 136 mmol/L (ref 135–145)

## 2020-05-14 LAB — BRAIN NATRIURETIC PEPTIDE: B Natriuretic Peptide: 638.3 pg/mL — ABNORMAL HIGH (ref 0.0–100.0)

## 2020-05-14 MED ORDER — LISINOPRIL 10 MG PO TABS: 10.0000 mg | ORAL_TABLET | Freq: Every day | ORAL | 0 refills | Status: DC

## 2020-05-14 MED ORDER — BUMETANIDE 1 MG PO TABS: 1.0000 mg | ORAL_TABLET | Freq: Two times a day (BID) | ORAL | 3 refills | Status: DC

## 2020-05-14 MED ORDER — BUMETANIDE 1 MG PO TABS: 1.0000 mg | ORAL_TABLET | Freq: Two times a day (BID) | ORAL | 0 refills | Status: DC

## 2020-05-14 MED ORDER — SENNA 8.6 MG PO TABS: 1.0000 | ORAL_TABLET | Freq: Every day | ORAL | Status: DC | PRN

## 2020-05-14 MED ORDER — LISINOPRIL 10 MG PO TABS: 10.0000 mg | ORAL_TABLET | Freq: Every day | ORAL | 3 refills | Status: DC

## 2020-05-14 NOTE — Discharge Summary (Signed)
Physician Discharge Howard [facility]    Patient: [name]                   Admit date: 05/11/2020   DOB: [birth year]             Discharge date:05/14/2020/11:34 AM MEQ:[id number]                          PCP: [name], MD  Disposition: HOME   Recommendations for Outpatient Follow-up:   . Follow up: in 1 week  Discharge Condition: Stable   Code Status:   Code Status: Full Code  Diet recommendation: Cardiac diet   Discharge Diagnoses:    Principal Problem:   Acute on chronic diastolic CHF (congestive heart failure) ([place]) Active Problems:   Essential hypertension   Paroxysmal atrial fibrillation ([name])   History of aortic valve replacement with bioprosthetic valve   COPD (chronic obstructive pulmonary disease) ([place])   Hypothyroidism   History of Present Illness/ Hospital Course [name]:   [name] a 74 y.o.fw PMH/o  severe aortic stenosis status post bioprosthetic AV,chronic respiratory failure on 3 L of oxygen continuous, paroxysmal A. fib status post multiple cardioversions on flecainide and on chronic anticoagulation with Eliquis. She presented with shortness of breath which has progressively worsened over the last couple of days and mostly with exertion.  Patient states that her pulse oximetry drops to the mid 80s on 3 L which is her baseline and has to rest for her pulse oximetry to improve. Shortness of breath is associated with 2 pillow orthopnea and intermittent chest pain mostly in the lower sternum.   ED Course: ABG shows pH 7.41, PCO2 48, PO2 89, bicarb 30, O2 sat 96.4 Sodium 134, K 3.5, chloride 98, bicarb 28, BUN 17, creatinine 0.7, magnesium 2.1, AST 24, ALT 28, BNP1961,troponin 20,, white count 7.6, hemoglobin 11.3, hematocrit 35, MCV 81 CT scan of the chest without contrast showed bilateral pleural effusions small to moderate in size. Mild interstitial edema within the upper lobes. Cardiomegaly. Chest x-ray  reviewed by me reveals cardiomegaly with bilateral pleural effusions. Twelve-lead EKG shows sinus rhythmwithleft bundle branch block  Admitted to the hospital for acute on chronic diastolic dysfunction CHF. She received a dose of Bumex in the ER   Acute on chronic diastolic CHF -Much improved back to baseline 3 L of oxygen, satting greater -She has been weaned off 5 L of oxygen, currently on 3 L,  -Bumex was switched to p.o. 1 mg p.o. twice daily  -Monitor I's and O's, -1280 today Monitoring daily weight  -recent 2D echocardiogram done at [facility] in June which showed an LVEF greater than 55% - with hypoxia on 3 L(pulse oximetry in the mid 80s on 3 L) -currently requiring 5 L o2  Chest x-ray shows bilateral pleural effusions and BNP is elevated Start patient on Bumex 1mg  IV IV q 12 >> to be continued per cardiology for 1 more day>> switch to p.o. Continue lisinopril Patient not on beta-blockers due to acute exacerbation Maintain low-sodium diet and check daily weights -Cardiology >>> cleared the patient for discharge  Acute on chronic respiratory failure with underlying COPD exacerbated by CHF and pleural effusion -Baseline -chronic O2 demand 3 L---stable back to baseline -Has been tapered off 5 L to 3 L of oxygen to her baseline, satting 95% -No wheezing or rhonchi is noted -no signs of COPD exacerbation -Nevertheless  continue O2 supplements, DuoNeb bronchodilator and inhalers Scheduled and as needed -Continue inhaled steroids -Continue treating volume overload with diuretics  Bilateral pleural effusion -We will continue diuresing with Bumex -Stable, O2 demand has improved hypoxia has improved   Paroxysmal A. Fib -  remained stable Continue flecainide Continue Eliquis as primary prophylaxis for an acute stroke   S/P placement of cardiac pacemaker Extensive cardiac history, discussed this may also be contributing to her increased  shortness of breath. Has recently been seen by her cardiologist earlier this month.Echo 02/07/20 EF 68%. Pacemaker placed June 2021, no reported complications.    History of anxiety and depression Discontinued trazodone and continue as needed Xanax -Stable in no distress   Hypothyroidism Continue Synthroid -Stable no changes   Hypokalemia/hypomagnesemia -Monitoring, currently K 2.8 >>> 40 meq,  was repleted With 2 g of magnesium    Consultants: Cardiology   ----------------------------------------------------------------------------------------------------------------------------------------------   Code Status:   Code Status: Full Code Family Communication: No family member present at bedside  Dispo: The patient is from: Home  Anticipated d/c is to: Home      Discharge Instructions:   Discharge Instructions    Activity as tolerated - No restrictions   Complete by: As directed    Diet - low sodium heart healthy   Complete by: As directed    Increase activity slowly   Complete by: As directed    Increase activity slowly   Complete by: As directed        Medication List    STOP taking these medications   ALLERGY EYE DROPS OP   azithromycin 250 MG tablet Commonly known as: ZITHROMAX   diclofenac sodium 1 % Gel Commonly known as: Voltaren   hydrALAZINE 25 MG tablet Commonly known as: APRESOLINE   lisinopril-hydrochlorothiazide 20-25 MG tablet Commonly known as: ZESTORETIC   loratadine 10 MG tablet Commonly known as: CLARITIN   meclizine 12.5 MG tablet Commonly known as: ANTIVERT   predniSONE 10 MG tablet Commonly known as: DELTASONE   traZODone 50 MG tablet Commonly known as: DESYREL     TAKE these medications   acetaminophen 500 MG tablet Commonly known as: TYLENOL Take 500 mg by mouth every 4 (four) hours as needed for moderate pain or headache.   albuterol 108 (90 Base) MCG/ACT inhaler Commonly known as:  VENTOLIN HFA Inhale 2 puffs into the lungs every 6 (six) hours as needed for wheezing or shortness of breath.   albuterol (2.5 MG/3ML) 0.083% nebulizer solution Commonly known as: PROVENTIL Take 3 mLs (2.5 mg total) by nebulization every 6 (six) hours as needed for wheezing or shortness of breath.   ALPRAZolam 0.25 MG tablet Commonly known as: XANAX Take 1 tablet (0.25 mg total) by mouth 2 (two) times daily as needed for anxiety.   atorvastatin 40 MG tablet Commonly known as: LIPITOR 1 tab po qhs   bismuth subsalicylate 937 JI/96VE suspension Commonly known as: PEPTO BISMOL Take 30 mLs by mouth every 6 (six) hours as needed for indigestion or diarrhea or loose stools.   bumetanide 1 MG tablet Commonly known as: BUMEX Take 1 tablet (1 mg total) by mouth 2 (two) times daily.   CALCIUM 600+D PO Take 1 tablet by mouth daily.   CoQ10 200 MG Caps Take 200 mg by mouth daily.   docusate sodium 50 MG capsule Commonly known as: COLACE Take 100 mg by mouth at bedtime.   DULoxetine 60 MG capsule Commonly known as: CYMBALTA Take 1 capsule (60 mg total) by  mouth 2 (two) times daily.   Eliquis 5 MG Tabs tablet Generic drug: apixaban Take 1 tablet by mouth twice daily. What changed: how much to take   FerrouSul 325 (65 FE) MG tablet Generic drug: ferrous sulfate Take 650 mg by mouth daily with breakfast.   flecainide 50 MG tablet Commonly known as: TAMBOCOR Take 1 tablet (50 mg total) by mouth 2 (two) times daily. PLEASE CONTACT OFFICE [phone] TO SCHEDULE APPOINTMENT FOR FUTURE REFILLS   ipratropium-albuterol 0.5-2.5 (3) MG/3ML Soln Commonly known as: DUONEB Take 3 mLs by nebulization every 6 (six) hours as needed.   levothyroxine 25 MCG tablet Commonly known as: SYNTHROID Take 1 tablet (25 mcg total) by mouth daily before breakfast.   lisinopril 10 MG tablet Commonly known as: ZESTRIL Take 1 tablet (10 mg total) by mouth daily. Start taking on: May 15, 2020     pantoprazole 40 MG tablet Commonly known as: PROTONIX Take 1 tablet (40 mg total) by mouth daily.   potassium chloride 10 MEQ tablet Commonly known as: KLOR-CON Take 2 tablets (20 mEq total) by mouth 2 (two) times daily.   sodium chloride 0.65 % Soln nasal spray Commonly known as: OCEAN Place 1 spray into both nostrils 2 (two) times daily.   Trelegy Ellipta 100-62.5-25 MCG/INH Aepb Generic drug: Fluticasone-Umeclidin-Vilant INHALE 1 PUFF INTO THE LUNGS DAILY       Follow-up Information    [facility] up on 05/21/2020.   Specialty: Cardiology Why: at 8:30am. Enter through the [facility] entrance Contact information: [place]             Allergies  Allergen Reactions  . Benadryl [Diphenhydramine Hcl (Sleep)] Palpitations  . Cetirizine Palpitations  . Lasix [Furosemide] Rash  . Levaquin [Levofloxacin In D5w] Other (See Comments)    Reaction:  Fatigue and muscle soreness  . Meloxicam Rash  . Soy Allergy Hives and Nausea And Vomiting  . Sulfa Antibiotics Rash     Procedures /Studies:   DG Chest 1 View  Result Date: 05/11/2020 CLINICAL DATA:  Shortness of breath.  On Lasix. EXAM: CHEST  1 VIEW COMPARISON:  03/29/2020 FINDINGS: Right axillary surgical clips. Pacer. Prior median sternotomy. Patient rotated left. Midline trachea. Moderate cardiomegaly. Left larger than right small pleural effusions, similar. No pneumothorax. The Chin overlies the apices minimally. Interstitial prominence and indistinctness, progressive. Left greater than right base airspace disease, increased. IMPRESSION: Since 03/29/2020, worsened aeration. Cardiomegaly with mild interstitial edema and bilateral pleural effusions. Bibasilar airspace disease which could represent atelectasis or concurrent infection. Aortic Atherosclerosis (ICD10-I70.0). Electronically Signed   By: Abigail Miyamoto M.D.   On: 05/11/2020 12:04   CT Chest Wo Contrast  Result Date: 05/11/2020 CLINICAL DATA:  COPD exacerbation.  Shortness of breath. EXAM: CT CHEST WITHOUT CONTRAST TECHNIQUE: Multidetector CT imaging of the chest was performed following the standard protocol without IV contrast. COMPARISON:  Chest CT dated 09/11/2005. Chest x-ray from earlier same day. FINDINGS: Cardiovascular: Cardiomegaly. No pericardial effusion. Pacemaker/ICD apparatus in place. Aortic root hardware in place. Aortic atherosclerosis. No thoracic aortic aneurysm. Mediastinum/Nodes: No mass or enlarged lymph nodes are seen within the mediastinum. Esophagus is unremarkable. Trachea is unremarkable. Lungs/Pleura: Bilateral pleural effusions, small to moderate in size, at least partially loculated on the RIGHT. Associated compressive atelectasis at each lung base. Emphysematous changes within the upper lobes, mild to moderate in degree. Mild interstitial thickening within the upper lobes  suggesting mild edema. Upper Abdomen: No acute findings. Multiple small stones within the otherwise normal appearing gallbladder. Musculoskeletal: Degenerative spondylosis of the kyphotic thoracolumbar spine, mild to moderate in degree. Median sternotomy wires in place. No acute appearing osseous abnormality. IMPRESSION: 1. Bilateral pleural effusions, small to moderate in size, at least partially loculated on the RIGHT. Associated atelectasis at the bilateral lung bases. 2. Mild interstitial edema within the upper lobes. 3. No additional consolidation to suggest pneumonia. 4. Cardiomegaly. 5. Cholelithiasis without evidence of acute cholecystitis. Aortic Atherosclerosis (ICD10-I70.0) and Emphysema (ICD10-J43.9). Electronically Signed   By: Franki Cabot M.D.   On: 05/11/2020 13:51     Subjective:   Patient was seen and examined 05/14/2020, 11:34 AM Patient stable today. No acute distress.  No issues overnight Stable for discharge.  Discharge Exam:     Vitals:   05/13/20 1930 05/14/20 0437 05/14/20 0813 05/14/20 1117  BP: 133/73 122/62 139/83 127/72  Pulse: 83 81 84 88  Resp: 20 20 17 17   Temp: 97.9 F (36.6 C) 97.6 F (36.4 C) 97.7 F (36.5 C) 97.9 F (36.6 C)  TempSrc: Oral Oral Oral   SpO2: 98% 97% 98% 100%  Weight:  79 kg    Height:        General: Pt lying comfortably in bed & appears in no obvious distress. Cardiovascular: S1 & S2 heard, RRR, S1/S2 +. No murmurs, rubs, gallops or clicks. No JVD or pedal edema. Respiratory: Clear to auscultation without wheezing, rhonchi or crackles. No increased work of breathing. Abdominal:  Non-distended, non-tender & soft. No organomegaly or masses appreciated. Normal bowel sounds heard. CNS: Alert and oriented. No focal deficits. Extremities: no edema, no cyanosis    The results of significant diagnostics from this hospitalization (including imaging, microbiology, ancillary and laboratory) are listed below for reference.      Microbiology:   Recent Results (from the past 240 hour(s))  SARS Coronavirus 2 by RT PCR (hospital order, performed in [facility] hospital lab) Nasopharyngeal Nasopharyngeal Swab     Status: None   Collection Time: 05/11/20 12:19 PM   Specimen: Nasopharyngeal Swab  Result Value Ref Range Status   SARS Coronavirus 2 NEGATIVE NEGATIVE Final    Comment: (NOTE) SARS-CoV-2 target nucleic acids are NOT DETECTED.  The SARS-CoV-2 RNA is generally detectable in upper and lower respiratory specimens during the acute phase of infection. The lowest concentration of SARS-CoV-2 viral copies this assay can detect is 250 copies / mL. A negative result does not preclude SARS-CoV-2 infection and should not be used as the sole basis for treatment or other patient management decisions.  A negative result may occur with improper specimen collection / handling, submission of specimen other than nasopharyngeal swab, presence of viral mutation(s) within the areas  targeted by this assay, and inadequate number of viral copies (<250 copies / mL). A negative result must be combined with clinical observations, patient history, and epidemiological information.  Fact Sheet for Patients:   StrictlyIdeas.no  Fact Sheet for Healthcare Providers: BankingDealers.co.za  This test is not yet approved or  cleared by the [place] FDA and has been authorized for detection and/or diagnosis of SARS-CoV-2 by FDA under an Emergency Use Authorization (EUA).  This EUA will remain in effect (meaning this test can be used) for the duration of the COVID-19 declaration under Section 564(b)(1) of the Act, 21 U.S.C. section 360bbb-3(b)(1), unless the authorization is terminated or revoked sooner.  Performed at [facility], [address] [place],  [place] [address]      Labs:   CBC: Recent Labs  Lab 05/11/20 1219 05/12/20 0815  WBC 7.6 7.4  NEUTROABS 5.1 4.9  HGB 11.3* 11.0*  HCT 35.6* 34.6*  MCV 81.5 82.4  PLT 372 229   Basic Metabolic Panel: Recent Labs  Lab 05/11/20 1219 05/12/20 0551 05/13/20 0517 05/13/20 0549 05/14/20 0714  NA 134* 135  --  138 136  K 3.5 3.2*  --  2.8* 2.8*  CL 98 95*  --  97* 95*  CO2 28 31  --  33* 32  GLUCOSE 106* 114*  --  85 93  BUN 17 19  --  25* 25*  CREATININE 0.77 0.89  --  0.82 0.81  CALCIUM 9.1 9.3  --  8.9 8.6*  MG 2.1  --  2.0  --   --    Liver Function Tests: Recent Labs  Lab 05/11/20 1219  AST 24  ALT 28  ALKPHOS 54  BILITOT 0.7  PROT 7.0  ALBUMIN 3.8   BNP (last 3 results) Recent Labs    05/11/20 1219 05/13/20 0549 05/14/20 0714  BNP 1,961.1* 1,048.5* 638.3*   Cardiac Enzymes: No results for input(s): CKTOTAL, CKMB, CKMBINDEX, TROPONINI in the last 168 hours. CBG: No results for input(s): GLUCAP in the last 168 hours. Hgb A1c No results for input(s): HGBA1C in the last 72 hours. Lipid Profile No results for input(s): CHOL,  HDL, LDLCALC, TRIG, CHOLHDL, LDLDIRECT in the last 72 hours. Thyroid function studies No results for input(s): TSH, T4TOTAL, T3FREE, THYROIDAB in the last 72 hours.  Invalid input(s): FREET3 Anemia work up No results for input(s): VITAMINB12, FOLATE, FERRITIN, TIBC, IRON, RETICCTPCT in the last 72 hours. Urinalysis    Component Value Date/Time   COLORURINE Yellow 10/16/2011 0022   APPEARANCEUR Clear 07/10/2019 1609   LABSPEC 1.009 10/16/2011 0022   PHURINE 7.0 10/16/2011 0022   GLUCOSEU Negative 07/10/2019 1609   GLUCOSEU Negative 10/16/2011 0022   HGBUR Negative 10/16/2011 0022   BILIRUBINUR Negative 07/10/2019 1609   BILIRUBINUR Negative 10/16/2011 0022   KETONESUR Negative 10/16/2011 0022   PROTEINUR Negative 07/10/2019 1609   PROTEINUR Negative 10/16/2011 0022   UROBILINOGEN negative (A) 02/10/2018 1439   NITRITE Negative 07/10/2019 1609   NITRITE Negative 10/16/2011 0022   LEUKOCYTESUR Negative 07/10/2019 1609   LEUKOCYTESUR Trace 10/16/2011 0022         Time coordinating discharge: Over 45 minutes  SIGNED: Deatra James, MD, FACP, FHM. [facility],  Please use [email or web address] to Page If 7PM-7AM, please contact night-coverage [email or web address] [facility] 05/14/2020, 11:34 AM

## 2020-05-14 NOTE — Progress Notes (Signed)
[facility] Cardiology    SUBJECTIVE: The patient reports feeling better this morning and reports that her breathing is back to baseline. Her only complaint this morning is constipation with last bowel movement 3 days ago.   Vitals:   05/13/20 1713 05/13/20 1930 05/14/20 0437 05/14/20 0813  BP: (!) 148/82 133/73 122/62 139/83  Pulse: 90 83 81 84  Resp: 20 20 20 17   Temp: 98.6 F (37 C) 97.9 F (36.6 C) 97.6 F (36.4 C) 97.7 F (36.5 C)  TempSrc: Oral Oral Oral Oral  SpO2: 97% 98% 97% 98%  Weight:   79 kg   Height:         Intake/Output Summary (Last 24 hours) at 05/14/2020 0910 Last data filed at 05/14/2020 0813 Gross per 24 hour  Intake 1080 ml  Output 2000 ml  Net -920 ml      PHYSICAL EXAM  General: Well developed, well nourished, chronically ill appearing, sitting in recliner in no acute distress HEENT:  Normocephalic and atramatic Neck:  No JVD.  Lungs: diminished breath sounds throughout, no wheezing or crackles, normal effort of breathing on supplemental O2 via [place], conversing in full sentences Heart: HRRR . Normal S1 and S2 without gallops or murmurs.  Abdomen: nondistended Msk:  Back normal, gait not assessed. Normal strength and tone for age. Extremities: No clubbing, cyanosis or edema.   Neuro: Alert and oriented X 3. Psych:  Good affect, responds appropriately   LABS: Basic Metabolic Panel: Recent Labs    05/11/20 1219 05/12/20 0551 05/13/20 0517 05/13/20 0549 05/14/20 0714  NA 134*   < >  --  138 136  K 3.5   < >  --  2.8* 2.8*  CL 98   < >  --  97* 95*  CO2 28   < >  --  33* 32  GLUCOSE 106*   < >  --  85 93  BUN 17   < >  --  25* 25*  CREATININE 0.77   < >  --  0.82 0.81  CALCIUM 9.1   < >  --  8.9 8.6*  MG 2.1  --  2.0  --   --    < > = values in this interval not displayed.   Liver Function Tests: Recent Labs    05/11/20 1219  AST 24  ALT 28  ALKPHOS 54  BILITOT 0.7  PROT 7.0  ALBUMIN 3.8   No results for input(s): LIPASE, AMYLASE in  the last 72 hours. CBC: Recent Labs    05/11/20 1219 05/12/20 0815  WBC 7.6 7.4  NEUTROABS 5.1 4.9  HGB 11.3* 11.0*  HCT 35.6* 34.6*  MCV 81.5 82.4  PLT 372 340   Cardiac Enzymes: No results for input(s): CKTOTAL, CKMB, CKMBINDEX, TROPONINI in the last 72 hours. BNP: Invalid input(s): POCBNP D-Dimer: No results for input(s): DDIMER in the last 72 hours. Hemoglobin A1C: No results for input(s): HGBA1C in the last 72 hours. Fasting Lipid Panel: No results for input(s): CHOL, HDL, LDLCALC, TRIG, CHOLHDL, LDLDIRECT in the last 72 hours. Thyroid Function Tests: No results for input(s): TSH, T4TOTAL, T3FREE, THYROIDAB in the last 72 hours.  Invalid input(s): FREET3 Anemia Panel: No results for input(s): VITAMINB12, FOLATE, FERRITIN, TIBC, IRON, RETICCTPCT in the last 72 hours.  No results found.   Echo LVEF greater than 55%, bioprosthetic AV, mild MR and TR per echo 02/2020  TELEMETRY: sinus rhythm  ASSESSMENT AND PLAN:  Principal Problem:   Acute on  chronic diastolic CHF (congestive heart failure) ([name]) Active Problems:   Essential hypertension   Paroxysmal atrial fibrillation ([name])   History of aortic valve replacement with bioprosthetic valve   COPD (chronic obstructive pulmonary disease) ([name])   Hypothyroidism    1. Respiratory failure, multifactorial, secondary to COPD and acute on chronic diastolic congestive heart failure, slowly improving 2.Acute on chronic diastolic congestive heart failure, chest x-ray showing bilateral pleural effusions,does not appear fluid overloaded, currently onBumex 1 mg IV every 12. BNP down to 638 from 1048 yesterday. 3.Paroxysmal atrial fibrillation, status post ablation and dual-chamber pacemaker implantation, on Eliquis for stroke prevention, and flecainide for rhythm control 4.COPD, acute exacerbation, on chronic O2 therapy, DuoNebs, Incruse Ellipta 5. Hypertension, on lisinopril, hydralazine, and  Bumex  Recommendations: 1. Continue diuresis with Bumex with careful monitoring of renal status and I&Os. Recommend transitioning to oral Bumex 1 mg BID. 2. Continue Eliquis for stroke prevention 3. Continue flecainide 50 mg BID for rhythm control 4. Continue lisinopril and hydralazine. 5. No further cardiac diagnostics recommended at this time 6. Consider discharge today and follow-up with Heart Failure clinic and Dr. Saralyn Pilar in 1 week.  Clabe Seal, PA-C 05/14/2020 9:10 AM

## 2020-05-14 NOTE — Telephone Encounter (Signed)
Spoke with pt as per dfk we make her hospital follow up tomorrow

## 2020-05-14 NOTE — Telephone Encounter (Signed)
Pt advised prescription at pharmacy.beth

## 2020-05-14 NOTE — Discharge Instructions (Signed)
Pleural Effusion Pleural effusion is an abnormal buildup of fluid in the layers of tissue between the lungs and the inside of the chest (pleural space) The two layers of tissue that line the lungs and the inside of the chest are called pleura. Usually, there is no air in the space between the pleura, only a thin layer of fluid. Some conditions can cause a large amount of fluid to build up, which can cause the lung to collapse if untreated. A pleural effusion is usually caused by another disease that requires treatment. What are the causes? Pleural effusion can be caused by:  Heart failure.  Certain infections, such as pneumonia or tuberculosis.  Cancer.  A blood clot in the lung (pulmonary embolism).  Complications from surgery, such as from open heart surgery.  Liver disease (cirrhosis).  Kidney disease. What are the signs or symptoms? In some cases, pleural effusion may cause no symptoms. If symptoms are present, they may include:  Shortness of breath, especially when lying down.  Chest pain. This may get worse when taking a deep breath.  Fever.  Dry, long-lasting (chronic) cough.  Hiccups.  Rapid breathing. An underlying condition that is causing the pleural effusion (such as heart failure, pneumonia, blood clots, tuberculosis, or cancer) may also cause other symptoms. How is this diagnosed? This condition may be diagnosed based on:  Your symptoms and medical history.  A physical exam.  A chest X-ray.  A procedure to use a needle to remove fluid from the pleural space (thoracentesis). This fluid is tested.  Other imaging studies of the chest, such as ultrasound or CT scan. How is this treated? Depending on the cause of your condition, treatment may include:  Treating the underlying condition that is causing the effusion. When that condition improves, the effusion will also improve. Examples of treatment for underlying conditions include: ? Antibiotic medicines to  treat an infection. ? Diuretics or other heart medicines to treat heart failure.  Thoracentesis.  Placing a thin flexible tube under your skin and into your chest to continuously drain the effusion (indwelling pleural catheter).  Surgery to remove the outer layer of tissue from the pleural space (decortication).  A procedure to put medicine into the chest cavity to seal the pleural space and prevent fluid buildup (pleurodesis).  Chemotherapy and radiation therapy, if you have cancerous (malignant) pleural effusion. These treatments are typically used to treat cancer. They kill certain cells in the body. Follow these instructions at home:  Take over-the-counter and prescription medicines only as told by your health care provider.  Ask your health care provider what activities are safe for you.  Keep track of how long you are able to do mild exercise (such as walking) before you get short of breath. Write down this information to share with your health care provider. Your ability to exercise should improve over time.  Do not use any products that contain nicotine or tobacco, such as cigarettes and e-cigarettes. If you need help quitting, ask your health care provider.  Keep all follow-up visits as told by your health care provider. This is important. Contact a health care provider if:  The amount of time that you are able to do mild exercise: ? Decreases. ? Does not improve with time.  You have a fever. Get help right away if:  You are short of breath.  You develop chest pain.  You develop a new cough. Summary  Pleural effusion is an abnormal buildup of fluid in the layers   of tissue between the lungs and the inside of the chest.  Pleural effusion can have many causes, including heart failure, pulmonary embolism, infections, or cancer.  Symptoms of pleural effusion can include shortness of breath, chest pain, fever, long-lasting (chronic) cough, hiccups, or rapid  breathing.  Diagnosis often involves making images of the chest (such as with ultrasound or X-ray) and removing fluid (thoracentesis) to send for testing.  Treatment for pleural effusion depends on what underlying condition is causing it. This information is not intended to replace advice given to you by your health care provider. Make sure you discuss any questions you have with your health care provider. Document Revised: 08/06/2017 Document Reviewed: 04/29/2017 Elsevier Patient Education  2020 [employer]  

## 2020-05-14 NOTE — Care Management Important Message (Signed)
Important Message  Patient Details  Name: BUENA BOEHM MRN: [id number] Date of Birth: 07/12/1946   Medicare Important Message Given:  Yes     Dannette Barbara 05/14/2020, 11:59 AM

## 2020-05-14 NOTE — Progress Notes (Signed)
  Heart Failure Nurse Navigator Note:  Patient had presented with approximately 4-5 days of worsening SOB with exertion.  She also complained of PND and feeling SOB while wearing he CPAP.  Today she is sitting up in the chair, states she feels more like herself, denies any increasing SOB.  Is ready to go home.  Reinforced eating healthy and obstaining from  Food that contain salt.  Also discussed fluid restriction and food like jello and ice cream which because they melt in your mouth are considered liquids.  Also discussed weighing dally and recording and symptoms to report to doctor.  Her weight is down 6 pounds from admission.  Labs:  Sodium 136, potassium 2.8, BUN 25 and creatinine 0.81.  Potassium level  2.8  Was given  40 meq of potassium and repleted with 2 gram of magnesium.   Cormorbidities:  HFpEF ( EF60-65%) Hypertension Aortic stenosis S/P AVR with bioprosthetic valve COPD Paroxsymal atrial fibrillation  Medications:  Bumex 1 mg BID Potassium chloride 10 meq BID Lisinopril 10 ng daily Flecainide 50 mg BID  She is being discharged home, to follow up as outpatient in one week and is to be seen in the heart failure clinic on 05/24/20     Pricilla Riffle RN,CHFN

## 2020-05-15 ENCOUNTER — Inpatient Hospital Stay: Payer: Medicare Other | Admitting: Internal Medicine

## 2020-05-16 NOTE — Progress Notes (Signed)
Patient ID: AVREE SZCZYGIEL, female    DOB: 09/09/1945, 74 y.o.   MRN: [id number]  HPI  Ms Hopke is a 74 y/o female with a history of hyperlipidemia, HTN, thyroid disease, COPD, left ear deafness, atrial fibrillation, previous tobacco use and chronic heart failure.   Echo report from 02/07/20 reviewed and showed an EF of >55% along with severe LAE. Catheterization done 02/07/20 but unable to view the results.   Admitted 05/11/20 due to shortness of breath. Initially given IV diuretics with transition to oral diuretics. Cardiology consult obtained. Inhaled steroids and nebulizer treatments done. Oxygen weaned from 5L down to baseline of 3L. Discharged after 3 days.   She presents today for her initial visit with a chief complaint of moderate fatigue upon minimal exertion. She describes this as chronic in nature having been present for several years with varying levels of severity. She has associated cough, shortness of breath, chronic wheezing, hearing loss, light-headedness and shoulder pain along with this. She denies any difficulty sleeping, abdominal distention, palpitations, pedal edema, chest pain or weight gain.   Says that her BP at home fluctuates quite a bit. In the morning, her BP will be from 160-170/90's and then it comes down close to where it is today (118/85).   Past Medical History:  Diagnosis Date  . 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 s/p bioprosthetic valve replacement 2008 at [facility];  b. 01/2015 Echo: EF 60-65%, no rwma, Gr1 DD, mildly dil LA, nl RV fxn.  . Atrial fibrillation with RVR ([place]) 01/11/2015  . Atrial flutter ([place])    a. 08/2016 s/p DCCV.  Remains on flecainide 50 mg bid.  . Basal skull fracture ([place]) 20 yrs ago  . CHF (congestive heart failure) ([place])   . Chronic respiratory failure ([place])   . COPD (chronic obstructive pulmonary disease) ([place])    a. on home O2 at 2L since 2008  . 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 Essential  hypertension 01/24/2015  . History of cardiac cath    a. 2008 prior to Aortic aneurysm repair-->nl cors.  . History of stress test    a. 10/2015 MV: no ischemia/infarct.  [name] HLD (hyperlipidemia)   . HTN (hypertension)   . Hypothyroidism   . Obesity   . PAF (paroxysmal atrial fibrillation) ([name])    a. on Eliquis; b. CHADS2VASc = 3 (HTN, age x 1, female).  . Paroxysmal atrial fibrillation ([place]) 01/24/2015  . Right upper quadrant abdominal tenderness without rebound tenderness 03/02/2018  . S/P ascending aortic aneurysm repair 2008   Past Surgical History:  Procedure Laterality Date  . ABDOMINAL AORTIC ANEURYSM REPAIR  2008  . ABDOMINAL HYSTERECTOMY    . AORTIC VALVE REPLACEMENT  2008  . CARDIAC CATHETERIZATION     [facility]  . CARDIOVERSION N/A 08/15/2018   Procedure: CARDIOVERSION;  Surgeon: [name], MD;  Location: [facility] ORS;  Service: Cardiovascular;  Laterality: N/A;  . CARDIOVERSION N/A 11/14/2018   Procedure: CARDIOVERSION (CATH LAB);  Surgeon: [name], MD;  Location: [facility] ORS;  Service: Cardiovascular;  Laterality: N/A;  . CARDIOVERSION N/A 06/05/2019   Procedure: CARDIOVERSION;  Surgeon: [name], MD;  Location: [place] [facility];  Service: Cardiovascular;  Laterality: N/A;  . CARDIOVERSION N/A 08/14/2019   Procedure: CARDIOVERSION;  Surgeon: [name], MD;  Location: [facility] ORS;  Service: Cardiovascular;  Laterality: N/A;  . CARDIOVERSION N/A 11/09/2019   Procedure:  CARDIOVERSION;  Surgeon: Isaias Cowman, MD;  Location: [facility] ORS;  Service: Cardiovascular;  Laterality: N/A;  . CARDIOVERSION N/A 01/17/2020   Procedure: CARDIOVERSION;  Surgeon: Isaias Cowman, MD;  Location: [facility] ORS;  Service: Cardiovascular;  Laterality: N/A;  . CARPAL TUNNEL RELEASE    . ELECTROPHYSIOLOGIC STUDY N/A 08/17/2016   Procedure: Cardioversion;  Surgeon: [name], MD;  Location: [facility] ORS;  Service: Cardiovascular;  Laterality: N/A;  . TUMOR EXCISION Left    x3 (arm)    Family History  Problem Relation Age of Onset  . Stroke Father   . Stroke Paternal Grandmother    Social History   Tobacco Use  . Smoking status: Former Smoker    Types: Cigarettes  . Smokeless tobacco: Never Used  Substance Use Topics  . Alcohol use: No   Allergies  Allergen Reactions  . Benadryl [Diphenhydramine Hcl (Sleep)] Palpitations  . Cetirizine Palpitations  . Lasix [Furosemide] Rash  . Levaquin [Levofloxacin In D5w] Other (See Comments)    Reaction:  Fatigue and muscle soreness  . Meloxicam Rash  . Soy Allergy Hives and Nausea And Vomiting  . Sulfa Antibiotics Rash   Prior to Admission medications   Medication Sig Start Date End Date Taking? Authorizing Provider  acetaminophen (TYLENOL) 500 MG tablet Take 500 mg by mouth every 4 (four) hours as needed for moderate pain or headache.    Yes [provider]  albuterol (PROVENTIL) (2.5 MG/3ML) 0.083% nebulizer solution Take 3 mLs (2.5 mg total) by nebulization every 6 (six) hours as needed for wheezing or shortness of breath. 04/15/20  Yes Scarboro, Audie Clear, NP  albuterol (VENTOLIN HFA) 108 (90 Base) MCG/ACT inhaler Inhale 2 puffs into the lungs every 6 (six) hours as needed for wheezing or shortness of breath. 04/15/20  Yes Scarboro, Audie Clear, NP  ALPRAZolam Duanne Moron) 0.25 MG tablet Take 1 tablet (0.25 mg total) by mouth 2 (two) times daily as needed for anxiety. 12/19/19  Yes Ronnell Freshwater, NP  atorvastatin (LIPITOR) 40 MG tablet 1 tab po qhs 03/14/20  Yes Boscia, Heather E, NP  bumetanide (BUMEX) 1 MG tablet Take 1 tablet (1 mg total) by mouth 2 (two) times daily. 05/14/20 06/13/20 Yes Shahmehdi, Valeria Batman, MD  Calcium Carbonate-Vitamin D (CALCIUM 600+D PO) Take 1 tablet by mouth daily.   Yes [provider]  Coenzyme Q10 (COQ10) 200 MG CAPS Take 200 mg by mouth daily.   Yes [provider]  DULoxetine (CYMBALTA) 60 MG capsule Take 1 capsule (60 mg total) by mouth 2 (two) times daily. 03/25/20  Yes Boscia,  Heather E, NP  ELIQUIS 5 MG TABS tablet Take 1 tablet by mouth twice daily. Patient taking differently: Take 5 mg by mouth 2 (two) times daily.  10/26/19  Yes [name], MD  ferrous sulfate (FERROUSUL) 325 (65 FE) MG tablet Take 650 mg by mouth daily with breakfast.    Yes [provider]  flecainide (TAMBOCOR) 50 MG tablet Take 1 tablet (50 mg total) by mouth 2 (two) times daily. PLEASE CONTACT OFFICE [phone] TO SCHEDULE APPOINTMENT FOR FUTURE REFILLS 02/23/20  Yes Deboraha Sprang, MD  levothyroxine (SYNTHROID) 25 MCG tablet Take 1 tablet (25 mcg total) by mouth daily before breakfast. 12/27/19  Yes Boscia, Heather E, NP  pantoprazole (PROTONIX) 40 MG tablet Take 1 tablet (40 mg total) by mouth daily. 12/27/19  Yes Boscia, Greer Ee, NP  potassium chloride (KLOR-CON) 10 MEQ tablet Take 2 tablets (20 mEq total) by mouth  2 (two) times daily. 12/15/19  Yes Ronnell Freshwater, NP  traZODone (DESYREL) 50 MG tablet Take 50 mg by mouth at bedtime.   Yes [provider]  Donnal Debar 100-62.5-25 MCG/INH AEPB INHALE 1 PUFF INTO THE LUNGS DAILY 05/08/20  Yes [name], MD  ipratropium-albuterol (DUONEB) 0.5-2.5 (3) MG/3ML SOLN Take 3 mLs by nebulization every 6 (six) hours as needed. Patient not taking: Reported on 05/21/2020 05/09/20   Luiz Ochoa, NP    Review of Systems  Constitutional: Positive for fatigue (tire easily). Negative for appetite change.  HENT: Positive for hearing loss. Negative for congestion and sore throat.   Eyes: Negative.   Respiratory: Positive for cough, shortness of breath (minimal) and wheezing.   Cardiovascular: Negative for chest pain, palpitations and leg swelling.  Gastrointestinal: Negative for abdominal distention and abdominal pain.  Endocrine: Negative.   Genitourinary: Negative.   Musculoskeletal: Positive for arthralgias (bilateral shoulder pain). Negative for back pain.  Skin: Negative.   Allergic/Immunologic: Negative.    Neurological: Positive for light-headedness. Negative for dizziness.  Hematological: Negative for adenopathy. Does not bruise/bleed easily.  Psychiatric/Behavioral: Negative for dysphoric mood and sleep disturbance (wearing cpap at bedtime). The patient is not nervous/anxious.     Vitals:   05/21/20 1059  BP: 118/85  Pulse: 77  Resp: 18  SpO2: 98%  Weight: 171 lb (77.6 kg)  Height: 4\' 10"  (1.473 m)   Wt Readings from Last 3 Encounters:  05/21/20 171 lb (77.6 kg)  05/14/20 174 lb 1.6 oz (79 kg)  05/08/20 181 lb (82.1 kg)   Lab Results  Component Value Date   CREATININE 0.81 05/14/2020   CREATININE 0.82 05/13/2020   CREATININE 0.89 05/12/2020    Physical Exam Vitals and nursing note reviewed.  Constitutional:      Appearance: Normal appearance.  HENT:     Head: Normocephalic and atraumatic.     Left Ear: Decreased hearing noted.  Cardiovascular:     Rate and Rhythm: Normal rate and regular rhythm.  Pulmonary:     Effort: Pulmonary effort is normal. No respiratory distress.     Breath sounds: No wheezing or rales.  Abdominal:     General: There is no distension.     Palpations: Abdomen is soft.  Musculoskeletal:     Cervical back: Normal range of motion and neck supple.     Right lower leg: No edema.     Left lower leg: No edema.  Skin:    General: Skin is warm and dry.  Neurological:     General: No focal deficit present.     Mental Status: She is alert and oriented to person, place, and time.  Psychiatric:        Mood and Affect: Mood normal.        Behavior: Behavior normal.        Thought Content: Thought content normal.    Assessment & Plan:  1: Chronic heart failure with preserved ejection fraction along with structural changes (LAE)- - NYHA class III - euvolemic today - weighing daily; reminded to call for an overnight weight gain of >2 pounds or a weekly weight gain of >5 pounds - not adding salt and is using pepper for seasoning; reviewed the  importance of closely following a low sodium diet and written dietary information was given to her - saw cardiology Margarito Courser) 05/10/20 - has had previous valve surgery and returns to surgeon in 1-2 months for routine follow-up - BNP 05/14/20 was  638.3 - reports receiving both covid vaccines  2: HTN- - BP looks good today - saw PCP Junius Creamer) 04/22/20 - BMP 05/14/20 reviewed and showed sodium 136, potassium 2.8, creatinine 0.81 and GFR >60  3: COPD- - wearing oxygen at 3L - saw pulmonology Kenton Kingfisher) 05/08/20 & returns 05/21/20  4: Atrial fibrillation-  - pacemaker placed June 2021 - has had multiple cardioversions   Patient did not bring her medications nor a list. Each medication was verbally reviewed with the patient and she was encouraged to bring the bottles to every visit to confirm accuracy of list.  Return in 2 months or sooner for any questions/problems before then.

## 2020-05-21 ENCOUNTER — Ambulatory Visit: Payer: Medicare Other | Attending: Family | Admitting: Family

## 2020-05-21 ENCOUNTER — Telehealth: Payer: Self-pay

## 2020-05-21 ENCOUNTER — Other Ambulatory Visit: Payer: Self-pay

## 2020-05-21 ENCOUNTER — Encounter: Payer: Self-pay | Admitting: Family

## 2020-05-21 VITALS — BP 118/85 | HR 77 | Resp 18 | Ht <= 58 in | Wt 171.0 lb

## 2020-05-21 DIAGNOSIS — Z881 Allergy status to other antibiotic agents status: Secondary | ICD-10-CM | POA: Diagnosis not present

## 2020-05-21 DIAGNOSIS — Z950 Presence of cardiac pacemaker: Secondary | ICD-10-CM | POA: Diagnosis not present

## 2020-05-21 DIAGNOSIS — E785 Hyperlipidemia, unspecified: Secondary | ICD-10-CM | POA: Insufficient documentation

## 2020-05-21 DIAGNOSIS — Z87891 Personal history of nicotine dependence: Secondary | ICD-10-CM | POA: Diagnosis not present

## 2020-05-21 DIAGNOSIS — Z7901 Long term (current) use of anticoagulants: Secondary | ICD-10-CM | POA: Diagnosis not present

## 2020-05-21 DIAGNOSIS — I350 Nonrheumatic aortic (valve) stenosis: Secondary | ICD-10-CM | POA: Insufficient documentation

## 2020-05-21 DIAGNOSIS — E039 Hypothyroidism, unspecified: Secondary | ICD-10-CM | POA: Diagnosis not present

## 2020-05-21 DIAGNOSIS — Z952 Presence of prosthetic heart valve: Secondary | ICD-10-CM | POA: Diagnosis not present

## 2020-05-21 DIAGNOSIS — Z79899 Other long term (current) drug therapy: Secondary | ICD-10-CM | POA: Insufficient documentation

## 2020-05-21 DIAGNOSIS — I480 Paroxysmal atrial fibrillation: Secondary | ICD-10-CM

## 2020-05-21 DIAGNOSIS — Z79890 Hormone replacement therapy (postmenopausal): Secondary | ICD-10-CM | POA: Diagnosis not present

## 2020-05-21 DIAGNOSIS — I110 Hypertensive heart disease with heart failure: Secondary | ICD-10-CM | POA: Diagnosis not present

## 2020-05-21 DIAGNOSIS — I10 Essential (primary) hypertension: Secondary | ICD-10-CM

## 2020-05-21 DIAGNOSIS — Z882 Allergy status to sulfonamides status: Secondary | ICD-10-CM | POA: Diagnosis not present

## 2020-05-21 DIAGNOSIS — Z888 Allergy status to other drugs, medicaments and biological substances status: Secondary | ICD-10-CM | POA: Insufficient documentation

## 2020-05-21 DIAGNOSIS — H9192 Unspecified hearing loss, left ear: Secondary | ICD-10-CM | POA: Diagnosis not present

## 2020-05-21 DIAGNOSIS — E669 Obesity, unspecified: Secondary | ICD-10-CM | POA: Diagnosis not present

## 2020-05-21 DIAGNOSIS — I5032 Chronic diastolic (congestive) heart failure: Secondary | ICD-10-CM | POA: Diagnosis not present

## 2020-05-21 DIAGNOSIS — J449 Chronic obstructive pulmonary disease, unspecified: Secondary | ICD-10-CM

## 2020-05-21 NOTE — Patient Instructions (Signed)
Continue weighing daily and call for an overnight weight gain of > 2 pounds or a weekly weight gain of >5 pounds. 

## 2020-05-21 NOTE — Telephone Encounter (Signed)
LMOM for OV on 9/16

## 2020-05-23 ENCOUNTER — Ambulatory Visit: Payer: Medicare Other | Admitting: Hospice and Palliative Medicine

## 2020-05-24 DIAGNOSIS — Z950 Presence of cardiac pacemaker: Secondary | ICD-10-CM | POA: Diagnosis not present

## 2020-06-02 DIAGNOSIS — J449 Chronic obstructive pulmonary disease, unspecified: Secondary | ICD-10-CM | POA: Diagnosis not present

## 2020-06-12 DIAGNOSIS — G4733 Obstructive sleep apnea (adult) (pediatric): Secondary | ICD-10-CM | POA: Diagnosis not present

## 2020-06-13 ENCOUNTER — Encounter: Payer: Self-pay | Admitting: Hospice and Palliative Medicine

## 2020-06-13 ENCOUNTER — Ambulatory Visit: Payer: Medicare Other | Admitting: Hospice and Palliative Medicine

## 2020-06-13 DIAGNOSIS — Z79899 Other long term (current) drug therapy: Secondary | ICD-10-CM

## 2020-06-13 DIAGNOSIS — I480 Paroxysmal atrial fibrillation: Secondary | ICD-10-CM | POA: Diagnosis not present

## 2020-06-13 DIAGNOSIS — F411 Generalized anxiety disorder: Secondary | ICD-10-CM | POA: Diagnosis not present

## 2020-06-13 DIAGNOSIS — I5032 Chronic diastolic (congestive) heart failure: Secondary | ICD-10-CM

## 2020-06-13 DIAGNOSIS — J9611 Chronic respiratory failure with hypoxia: Secondary | ICD-10-CM | POA: Diagnosis not present

## 2020-06-13 LAB — POCT URINE DRUG SCREEN
POC Amphetamine UR: NOT DETECTED
POC BENZODIAZEPINES UR: POSITIVE — AB
POC Barbiturate UR: NOT DETECTED
POC Cocaine UR: NOT DETECTED
POC Ecstasy UR: NOT DETECTED
POC Marijuana UR: NOT DETECTED
POC Methadone UR: NOT DETECTED
POC Methamphetamine UR: NOT DETECTED
POC Opiate Ur: NOT DETECTED
POC Oxycodone UR: NOT DETECTED
POC PHENCYCLIDINE UR: NOT DETECTED
POC TRICYCLICS UR: NOT DETECTED

## 2020-06-13 MED ORDER — ALPRAZOLAM 0.25 MG PO TABS: 0.2500 mg | ORAL_TABLET | Freq: Two times a day (BID) | ORAL | 0 refills | Status: DC | PRN

## 2020-06-13 NOTE — Progress Notes (Signed)
[facility] [address] [place], [place] [address]  Internal MEDICINE  Office Visit Note  Patient Name: [name]  [id number]  [id number]  Date of Service: 06/17/2020  Chief Complaint  Patient presents with   Follow-up    med refill, pt received controlled policy update form   Hypertension   Hyperlipidemia   Quality Metric Gaps    TDAP    HPI Patient is here for routine follow-up -She was recently admitted to the hospital for acute on chronic diastolic CHF -Has extensive cardiac and pulmonary history: severe aortic stenosis, s/p valve replacement, chronic respiratory failure on 3LPM, paroxysmal A. Fib, anticoagulated on Eliquis -Has shortness of breath at baseline, COPD and chronic respiratory failure -Followed closely by cardiology as well as pulmonology -Has take alprazolam as needed for anxiety, she suffers from severe anxiety due to shortness of breath, alprazolam helps her relax and prevents further complications with her breathing  Current Medication: Outpatient Encounter Medications as of 06/13/2020  Medication Sig   acetaminophen (TYLENOL) 500 MG tablet Take 500 mg by mouth every 4 (four) hours as needed for moderate pain or headache.    albuterol (PROVENTIL) (2.5 MG/3ML) 0.083% nebulizer solution Take 3 mLs (2.5 mg total) by nebulization every 6 (six) hours as needed for wheezing or shortness of breath.   albuterol (VENTOLIN HFA) 108 (90 Base) MCG/ACT inhaler Inhale 2 puffs into the lungs every 6 (six) hours as needed for wheezing or shortness of breath.   ALPRAZolam (XANAX) 0.25 MG tablet Take 1 tablet (0.25 mg total) by mouth 2 (two) times daily as needed for anxiety.   atorvastatin (LIPITOR) 40 MG tablet 1 tab po qhs   bumetanide (BUMEX) 1 MG tablet Take 1 tablet (1 mg total) by mouth 2 (two) times daily.   Calcium Carbonate-Vitamin D (CALCIUM 600+D PO) Take 1 tablet by mouth daily.   Coenzyme Q10 (COQ10) 200 MG CAPS Take 200 mg by mouth  daily.   DULoxetine (CYMBALTA) 60 MG capsule Take 1 capsule (60 mg total) by mouth 2 (two) times daily.   ELIQUIS 5 MG TABS tablet Take 1 tablet by mouth twice daily. (Patient taking differently: Take 5 mg by mouth 2 (two) times daily. )   ferrous sulfate (FERROUSUL) 325 (65 FE) MG tablet Take 650 mg by mouth daily with breakfast.    flecainide (TAMBOCOR) 50 MG tablet Take 1 tablet (50 mg total) by mouth 2 (two) times daily. PLEASE CONTACT OFFICE [phone] TO SCHEDULE APPOINTMENT FOR FUTURE REFILLS   levothyroxine (SYNTHROID) 25 MCG tablet Take 1 tablet (25 mcg total) by mouth daily before breakfast.   pantoprazole (PROTONIX) 40 MG tablet Take 1 tablet (40 mg total) by mouth daily.   potassium chloride (KLOR-CON) 10 MEQ tablet Take 2 tablets (20 mEq total) by mouth 2 (two) times daily.   traZODone (DESYREL) 50 MG tablet Take 50 mg by mouth at bedtime.   TRELEGY ELLIPTA 100-62.5-25 MCG/INH AEPB INHALE 1 PUFF INTO THE LUNGS DAILY   [DISCONTINUED] ALPRAZolam (XANAX) 0.25 MG tablet Take 1 tablet (0.25 mg total) by mouth 2 (two) times daily as needed for anxiety.   ipratropium-albuterol (DUONEB) 0.5-2.5 (3) MG/3ML SOLN Take 3 mLs by nebulization every 6 (six) hours as needed. (Patient not taking: Reported on 05/21/2020)   No facility-administered encounter medications on file as of 06/13/2020.    Surgical History: Past Surgical History:  Procedure Laterality Date   ABDOMINAL AORTIC ANEURYSM REPAIR  2008   ABDOMINAL HYSTERECTOMY  AORTIC VALVE REPLACEMENT  2008   CARDIAC CATHETERIZATION     [place]   CARDIOVERSION N/A 08/15/2018   Procedure: CARDIOVERSION;  Surgeon: [name], MD;  Location: [facility];  Service: Cardiovascular;  Laterality: N/A;   CARDIOVERSION N/A 11/14/2018   Procedure: CARDIOVERSION (CATH LAB);  Surgeon: [name], MD;  Location: [facility];  Service: Cardiovascular;  Laterality: N/A;   CARDIOVERSION N/A 06/05/2019   Procedure: CARDIOVERSION;   Surgeon: [name], MD;  Location: [facility];  Service: Cardiovascular;  Laterality: N/A;   CARDIOVERSION N/A 08/14/2019   Procedure: CARDIOVERSION;  Surgeon: [name], MD;  Location: [facility];  Service: Cardiovascular;  Laterality: N/A;   CARDIOVERSION N/A 11/09/2019   Procedure: CARDIOVERSION;  Surgeon: Isaias Cowman, MD;  Location: [facility];  Service: Cardiovascular;  Laterality: N/A;   CARDIOVERSION N/A 01/17/2020   Procedure: CARDIOVERSION;  Surgeon: Isaias Cowman, MD;  Location: [facility];  Service: Cardiovascular;  Laterality: N/A;   CARPAL TUNNEL RELEASE     ELECTROPHYSIOLOGIC STUDY N/A 08/17/2016   Procedure: Cardioversion;  Surgeon: [name], MD;  Location: [facility];  Service: Cardiovascular;  Laterality: N/A;   TUMOR EXCISION Left    x3 (arm)    Medical History: Past Medical History:  Diagnosis Date   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 s/p bioprosthetic valve replacement 2008 at [facility];  b. 01/2015 Echo: EF 60-65%, no rwma, Gr1 DD, mildly dil LA, nl RV fxn.   Atrial fibrillation with RVR ([place]) 01/11/2015   Atrial flutter ([place])    a. 08/2016 s/p DCCV.  Remains on flecainide 50 mg bid.   Basal skull fracture ([name]) 20 yrs ago   CHF (congestive heart failure) ([name])    Chronic respiratory failure ([name])    COPD (chronic obstructive pulmonary disease) ([place])    a. 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 2008 prior to Aortic aneurysm repair-->nl cors.   History of stress test    a. 10/2015 MV: no ischemia/infarct.   HLD (hyperlipidemia)    HTN (hypertension)    Hypothyroidism    Obesity    PAF (paroxysmal atrial fibrillation) ([name])    a. on Eliquis; b. CHADS2VASc = 3 (HTN, age x 74, female).   Paroxysmal atrial fibrillation ([place]) 01/24/2015   Right upper quadrant abdominal tenderness without  rebound tenderness 03/02/2018   S/P ascending aortic aneurysm repair 2008    Family History: Family History  Problem Relation Age of Onset   Stroke Father    Stroke Paternal Grandmother     Social History   Socioeconomic History   Marital status: Single    Spouse name: Not on file   Number of children: Not on file   Years of education: Not on file   Highest education level: Not on file  Occupational History   Not on file  Tobacco Use   Smoking status: Former Smoker    Types: Cigarettes   Smokeless tobacco: Never Used  Scientific laboratory technician Use: Never used  Substance and Sexual Activity   Alcohol use: No   Drug use: No   Sexual activity: Never    Birth control/protection: Surgical  Other Topics Concern   Not on file  Social History Narrative   Not on file   Social Determinants of  Health   Financial Resource Strain:    Difficulty of Paying Living Expenses: Not on file  Food Insecurity:    Worried About [place] in the Last Year: Not on file   Ran Out of Food in the Last Year: Not on file  Transportation Needs:    Lack of Transportation (Medical): Not on file   Lack of Transportation (Non-Medical): Not on file  Physical Activity:    Days of Exercise per Week: Not on file   Minutes of Exercise per Session: Not on file  Stress:    Feeling of Stress : Not on file  Social Connections:    Frequency of Communication with Friends and Family: Not on file   Frequency of Social Gatherings with Friends and Family: Not on file   Attends Religious Services: Not on file   Active Member of Clubs or Organizations: Not on file   Attends Archivist Meetings: Not on file   Marital Status: Not on file  Intimate Partner Violence:    Fear of Current or Ex-Partner: Not on file   Emotionally Abused: Not on file   Physically Abused: Not on file   Sexually Abused: Not on file   Review of Systems  Constitutional: Negative for  chills, diaphoresis and fatigue.  HENT: Negative for ear pain, postnasal drip and sinus pressure.   Eyes: Negative for photophobia, discharge, redness, itching and visual disturbance.  Respiratory: Positive for shortness of breath and wheezing. Negative for cough.   Cardiovascular: Positive for leg swelling. Negative for chest pain and palpitations.  Gastrointestinal: Negative for abdominal pain, constipation, diarrhea, nausea and vomiting.  Genitourinary: Negative for dysuria and flank pain.  Musculoskeletal: Negative for arthralgias, back pain, gait problem and neck pain.  Skin: Negative for color change.  Allergic/Immunologic: Negative for environmental allergies and food allergies.  Neurological: Negative for dizziness and headaches.  Hematological: Does not bruise/bleed easily.  Psychiatric/Behavioral: Negative for agitation, behavioral problems (depression) and hallucinations.    Vital Signs: BP 108/68    Pulse 75    Temp 98.5 F (36.9 C)    Resp 16    Ht 4\' 10"  (1.473 m)    Wt 178 lb 12.8 oz (81.1 kg)    SpO2 96%    BMI 37.37 kg/m    Physical Exam Vitals reviewed.  Constitutional:      Appearance: She is obese.  Cardiovascular:     Rate and Rhythm: Normal rate and regular rhythm.     Pulses: Normal pulses.     Heart sounds: Normal heart sounds.  Pulmonary:     Effort: Pulmonary effort is normal.     Breath sounds: Normal breath sounds.  Skin:    General: Skin is warm.  Neurological:     General: No focal deficit present.     Mental Status: She is alert and oriented to person, place, and time. Mental status is at baseline.  Psychiatric:        Mood and Affect: Mood normal.        Behavior: Behavior normal.        Thought Content: Thought content normal.    PDMP reviewed, narcotic score 290, sedative score 341, overdose risk 300  Assessment/Plan: 1. GAD (generalized anxiety disorder) Anxiety related to multiple chronic medical conditions, specifically chronic  respiratory failure that causes shortness of breath and wheezing Alprazolam used to help manage anxiety due to difficulties with her breathing - ALPRAZolam (XANAX) 0.25 MG tablet; Take 1 tablet (0.25  mg total) by mouth 2 (two) times daily as needed for anxiety.  Dispense: 60 tablet; Refill: 0  2. Chronic respiratory failure with hypoxia ([name]) Continue with 3LPM supplemental oxygen and other supportive treatment  3. Paroxysmal atrial fibrillation ([name]) HR stable today, continue with current therapy and cardiology follow-up as indicated  4. Chronic diastolic heart failure ([name]) Stable today, appears euvolemic, continue with current therapy and cardiology follow-up as indicated, LVEF 55%  5. Encounter for long-term (current) use of medications - POCT Urine Drug Screen  General Counseling: Allesandra verbalizes understanding of the findings of todays visit and agrees with plan of treatment. I have discussed any further diagnostic evaluation that may be needed or ordered today. We also reviewed her medications today. she has been encouraged to call the office with any questions or concerns that should arise related to todays visit.  Reviewed risks and possible side effects associated with taking opiates, benzodiazepines and other CNS depressants. Combination of these could cause dizziness and drowsiness. Advised patient not to drive or operate machinery when taking these medications, as patient's and other's life can be at risk and will have consequences. Patient verbalized understanding in this matter. Dependence and abuse for these drugs will be monitored closely. A Controlled substance policy and procedure is on file which allows [facility] to order a urine drug screen test at any visit. Patient understands and agrees with the plan  Orders Placed This Encounter  Procedures   POCT Urine Drug Screen    Meds ordered this encounter  Medications   ALPRAZolam (XANAX) 0.25 MG tablet     Sig: Take 1 tablet (0.25 mg total) by mouth 2 (two) times daily as needed for anxiety.    Dispense:  60 tablet    Refill:  0    Time spent: 30  Minutes Time spent includes review of chart, medications, test results and follow-up plan with the patient.  This patient was seen by Theodoro Grist AGNP-C in Collaboration with Dr [name] as a part of collaborative care agreement     Tanna Furry. Tiffine Henigan AGNP-C Internal medicine

## 2020-06-17 ENCOUNTER — Encounter: Payer: Self-pay | Admitting: Hospice and Palliative Medicine

## 2020-06-18 ENCOUNTER — Telehealth: Payer: Self-pay

## 2020-06-18 NOTE — Telephone Encounter (Signed)
Lmom to schedule an appt for med refill. [name]

## 2020-06-27 ENCOUNTER — Other Ambulatory Visit: Payer: Self-pay

## 2020-06-27 MED ORDER — PANTOPRAZOLE SODIUM 40 MG PO TBEC
40.0000 mg | DELAYED_RELEASE_TABLET | Freq: Every day | ORAL | 1 refills | Status: DC
Start: 2020-06-27 — End: 2020-07-01

## 2020-06-27 MED ORDER — LEVOTHYROXINE SODIUM 25 MCG PO TABS: 25.0000 ug | ORAL_TABLET | Freq: Every day | ORAL | 1 refills | Status: DC

## 2020-07-01 ENCOUNTER — Other Ambulatory Visit: Payer: Self-pay

## 2020-07-01 MED ORDER — PANTOPRAZOLE SODIUM 40 MG PO TBEC
40.0000 mg | DELAYED_RELEASE_TABLET | Freq: Every day | ORAL | 1 refills | Status: DC
Start: 2020-07-01 — End: 2020-07-15

## 2020-07-01 MED ORDER — LEVOTHYROXINE SODIUM 25 MCG PO TABS: 25.0000 ug | ORAL_TABLET | Freq: Every day | ORAL | 1 refills | Status: DC

## 2020-07-02 DIAGNOSIS — J449 Chronic obstructive pulmonary disease, unspecified: Secondary | ICD-10-CM | POA: Diagnosis not present

## 2020-07-04 IMAGING — CR DG SHOULDER 2+V*L*
1 series · 3 of 3 positions shown · non-contrast
Comparison: 04/23/2014

CLINICAL DATA: Left shoulder pain.  No injury.

EXAM:
LEFT SHOULDER - 2+ VIEW

[Series 1: dg shoulder left · 0.14mm/px · 3 of 3 slices shown]
[im 1/3]
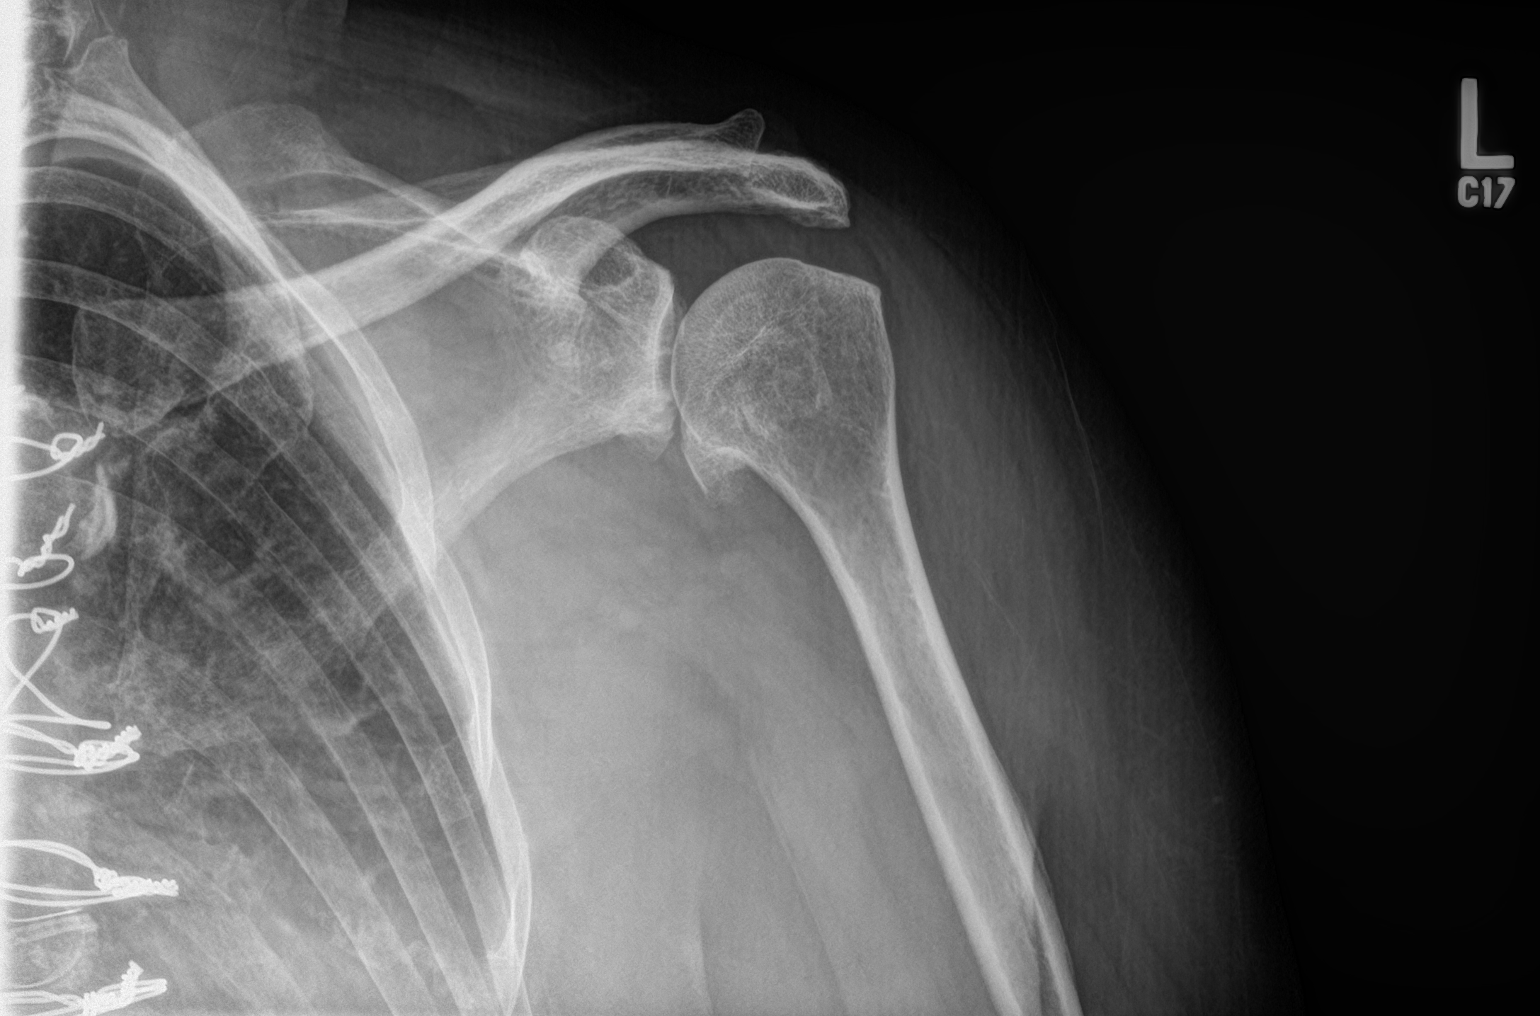
[im 2/3]
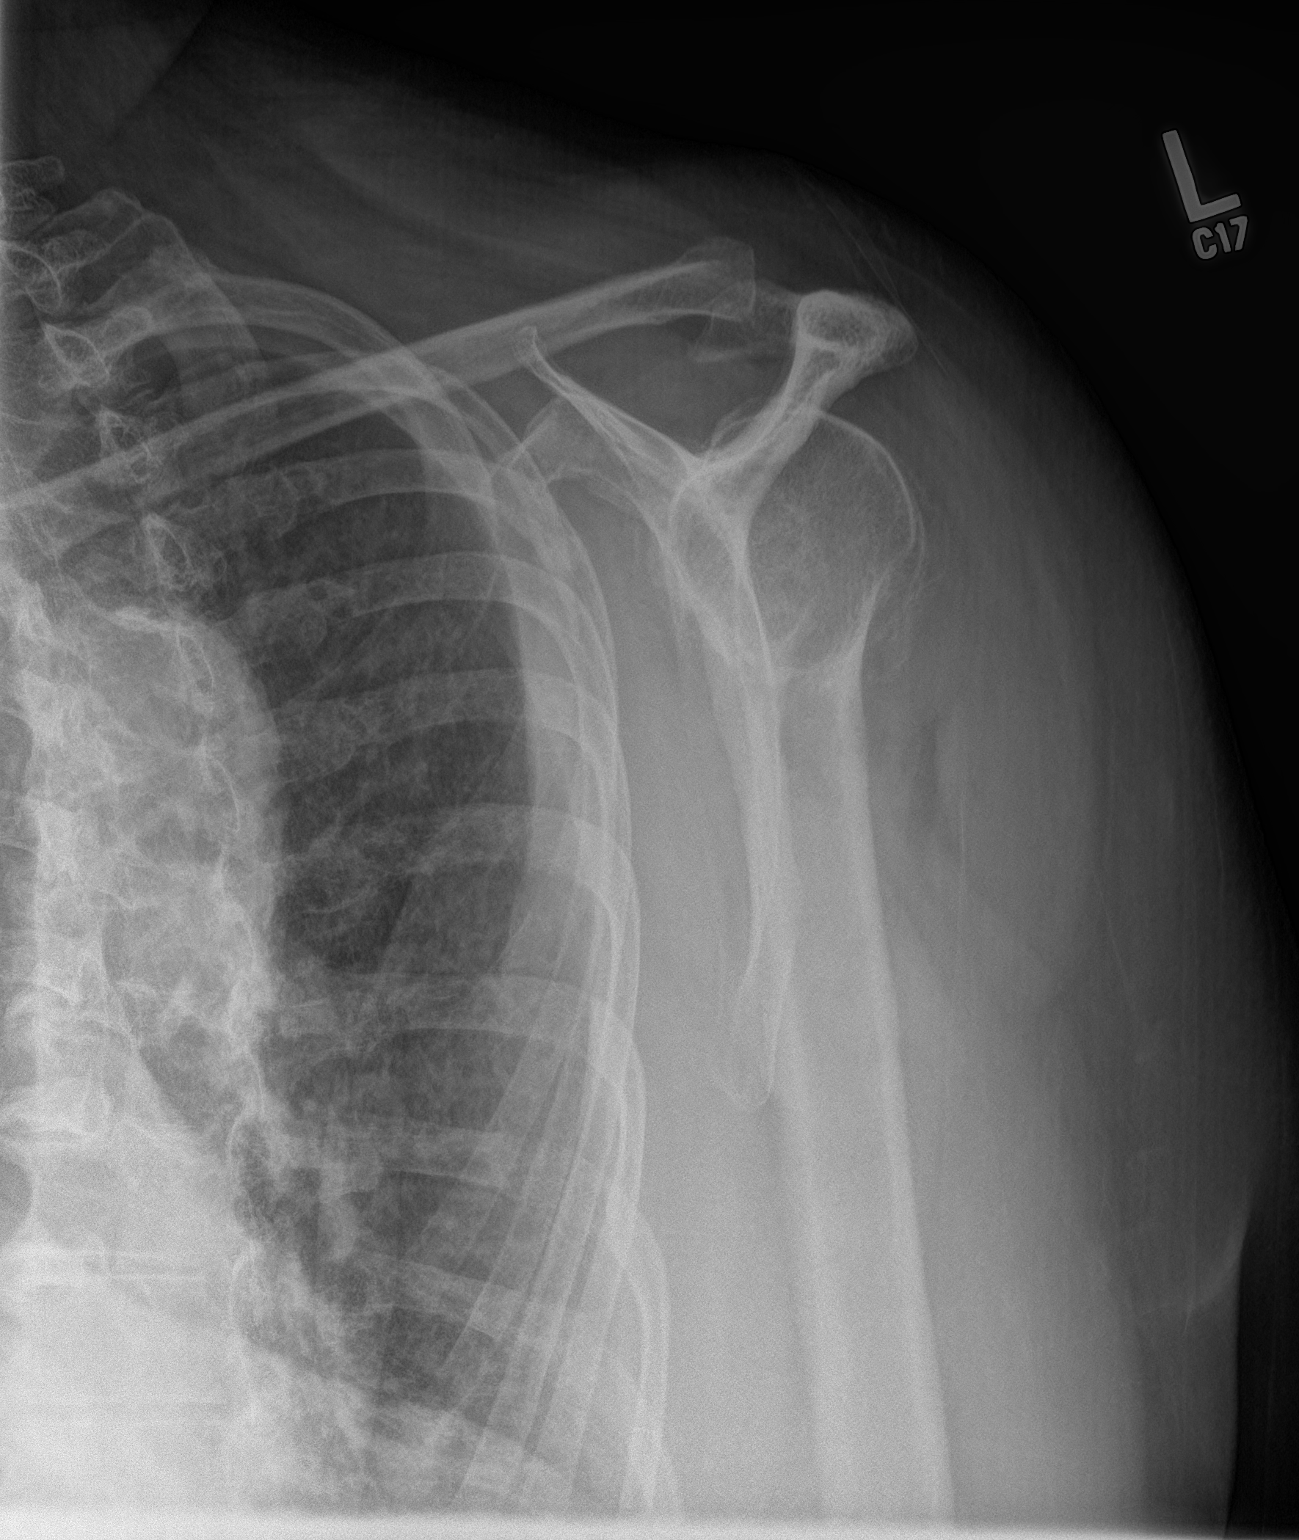
[im 3/3]
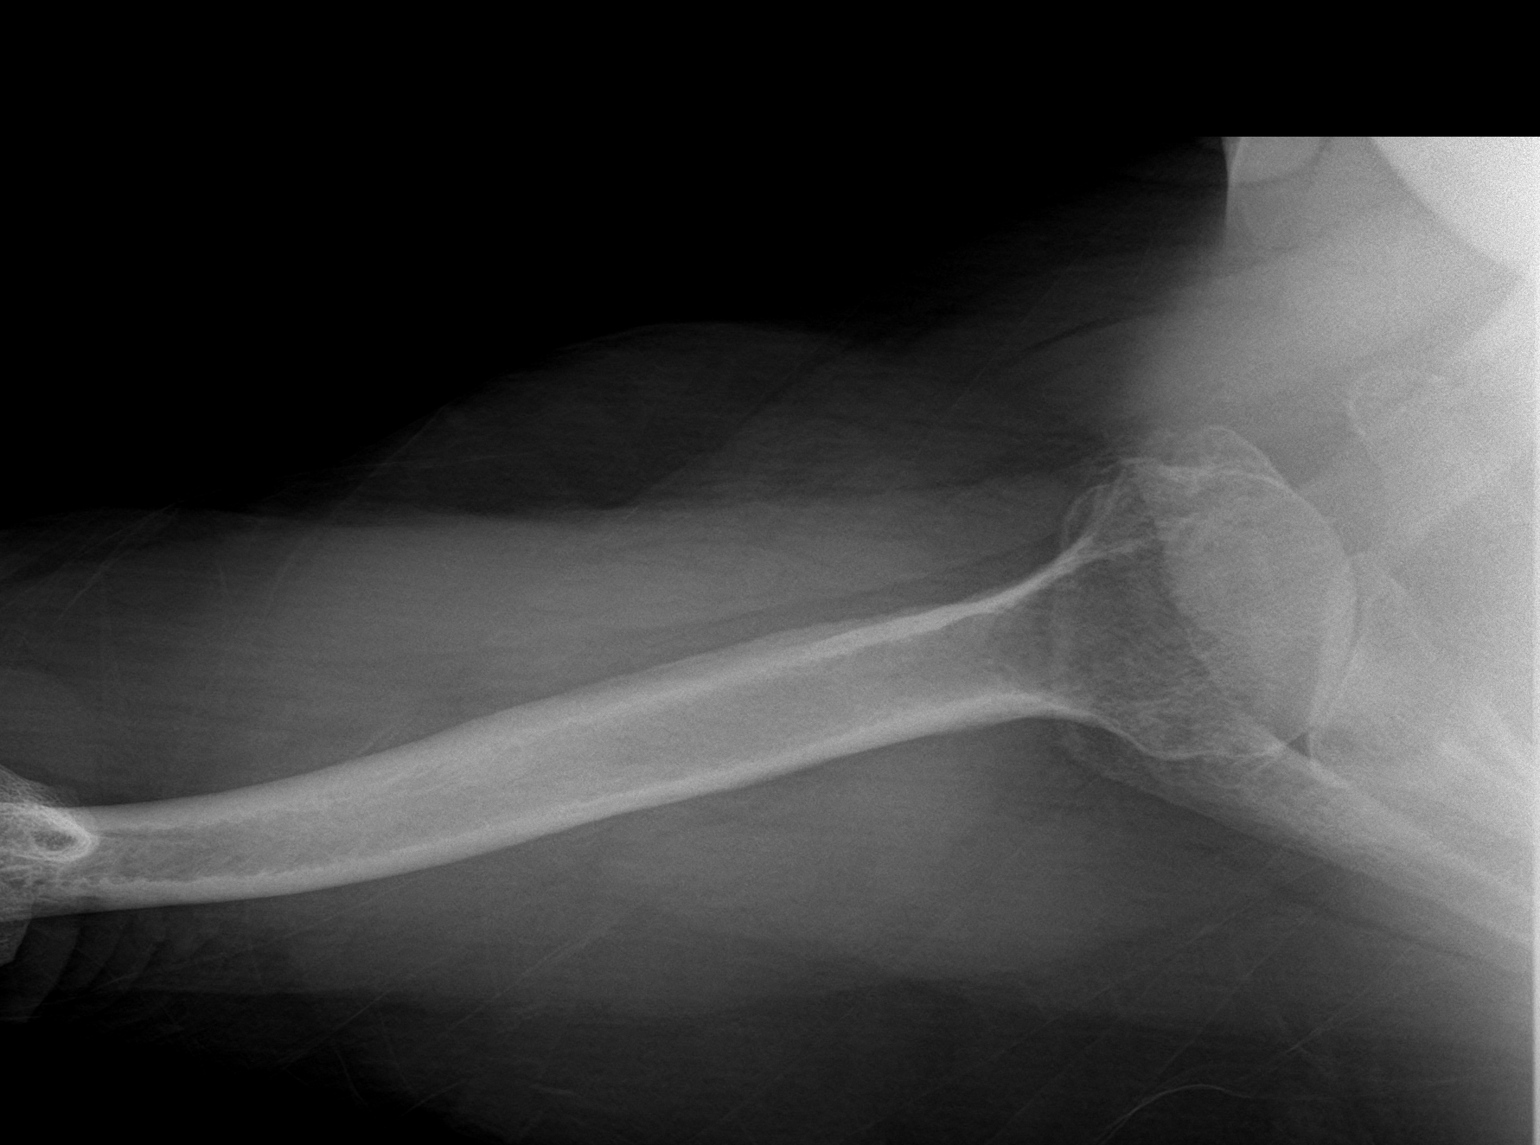

[3 of 3 positions shown; findings below may reference images not displayed]

FINDINGS: Exam demonstrates moderate degenerative changes of the glenohumeral
joint with progression since the previous exam. Minimal degenerate
change of the AC joint. No acute fracture or dislocation.
IMPRESSION: No acute findings.

Moderate degenerative changes of the glenohumeral joint and minimal
degenerate change of the AC joint.

## 2020-07-11 ENCOUNTER — Other Ambulatory Visit: Payer: Self-pay

## 2020-07-11 ENCOUNTER — Encounter: Payer: Self-pay | Admitting: Nurse Practitioner

## 2020-07-11 ENCOUNTER — Ambulatory Visit (INDEPENDENT_AMBULATORY_CARE_PROVIDER_SITE_OTHER): Payer: Medicare Other | Admitting: Nurse Practitioner

## 2020-07-11 VITALS — BP 148/79 | HR 91 | Temp 97.5°F | Resp 16 | Ht <= 58 in | Wt 179.4 lb

## 2020-07-11 DIAGNOSIS — I10 Essential (primary) hypertension: Secondary | ICD-10-CM | POA: Diagnosis not present

## 2020-07-11 DIAGNOSIS — Z9981 Dependence on supplemental oxygen: Secondary | ICD-10-CM

## 2020-07-11 DIAGNOSIS — I480 Paroxysmal atrial fibrillation: Secondary | ICD-10-CM | POA: Diagnosis not present

## 2020-07-11 DIAGNOSIS — Z0001 Encounter for general adult medical examination with abnormal findings: Secondary | ICD-10-CM

## 2020-07-11 DIAGNOSIS — J449 Chronic obstructive pulmonary disease, unspecified: Secondary | ICD-10-CM

## 2020-07-11 DIAGNOSIS — F411 Generalized anxiety disorder: Secondary | ICD-10-CM

## 2020-07-11 NOTE — Progress Notes (Signed)
[facility] [place], [place] [address]  Internal MEDICINE  Office Visit Note  Patient Name: [name]  [id number]  [id number]  Date of Service: 08/03/2020   Pt is here for routine health maintenance examination  Chief Complaint  Patient presents with  . Medicare Wellness  . Hyperlipidemia  . Hypertension     The patient is here for health maintenance exam. She states that she has got to the point she can't sleep well at night. She is taking trazodone to help her sleep. She states that past few nights, she has taken two tablets and this has helped a great deal. She feels as though her antidepressant is just not helping too much. She feels as though problems with her health are worsening her depression. She states that she has congestive heart failure, has got a pacemaker, states that her bones are  "bad" and everything seems to be weighing on her at this point. She states that she does not get a lot of help with housework at home. Unable to clean her home. Has trouble even washing her hair. Shoulders hurt. She takes tylenol and applies topical pain relieving gel. These measures do help. The patient does not want to see a counselor. States that she may need someone to come into her home to help her with housework and some activities of daily living.  She states that her blood pressure is elevated in the mornings. She states that during the day it gets better, but when she gets up in the morning, the pressure is always higher.     Current Medication: Outpatient Encounter Medications as of 07/11/2020  Medication Sig  . acetaminophen (TYLENOL) 500 MG tablet Take 500 mg by mouth every 4 (four) hours as needed for moderate pain or headache.   . albuterol (PROVENTIL) (2.5 MG/3ML) 0.083% nebulizer solution Take 3 mLs (2.5 mg total) by nebulization every 6 (six) hours as needed for wheezing or shortness of breath.  [name] albuterol (VENTOLIN HFA) 108 (90 Base) MCG/ACT  inhaler Inhale 2 puffs into the lungs every 6 (six) hours as needed for wheezing or shortness of breath.  . ALPRAZolam (XANAX) 0.25 MG tablet Take 1 tablet (0.25 mg total) by mouth 2 (two) times daily as needed for anxiety.  [name] atorvastatin (LIPITOR) 40 MG tablet 1 tab po qhs  . Calcium Carbonate-Vitamin D (CALCIUM 600+D PO) Take 1 tablet by mouth daily.  . Coenzyme Q10 (COQ10) 200 MG CAPS Take 200 mg by mouth daily.  . DULoxetine (CYMBALTA) 60 MG capsule Take 1 capsule (60 mg total) by mouth 2 (two) times daily.  [name] ELIQUIS 5 MG TABS tablet Take 1 tablet by mouth twice daily. (Patient taking differently: Take 5 mg by mouth 2 (two) times daily. )  . ferrous sulfate (FERROUSUL) 325 (65 FE) MG tablet Take 650 mg by mouth daily with breakfast.   . flecainide (TAMBOCOR) 50 MG tablet Take 1 tablet (50 mg total) by mouth 2 (two) times daily. PLEASE CONTACT OFFICE [phone] TO SCHEDULE APPOINTMENT FOR FUTURE REFILLS  . ipratropium-albuterol (DUONEB) 0.5-2.5 (3) MG/3ML SOLN Take 3 mLs by nebulization every 6 (six) hours as needed.  . potassium chloride (KLOR-CON) 10 MEQ tablet Take 2 tablets (20 mEq total) by mouth 2 (two) times daily.  . traZODone (DESYREL) 50 MG tablet Take 50 mg by mouth at bedtime.  . TRELEGY ELLIPTA 100-62.5-25 MCG/INH AEPB INHALE 1 PUFF INTO THE LUNGS DAILY  . [DISCONTINUED] levothyroxine (SYNTHROID) 25 MCG tablet  Take 1 tablet (25 mcg total) by mouth daily before breakfast.  . [DISCONTINUED] pantoprazole (PROTONIX) 40 MG tablet Take 1 tablet (40 mg total) by mouth daily.  . bumetanide (BUMEX) 1 MG tablet Take 1 tablet (1 mg total) by mouth 2 (two) times daily.   No facility-administered encounter medications on file as of 07/11/2020.    Surgical History: Past Surgical History:  Procedure Laterality Date  . ABDOMINAL AORTIC ANEURYSM REPAIR  2008  . ABDOMINAL HYSTERECTOMY    . AORTIC VALVE REPLACEMENT  2008  . CARDIAC CATHETERIZATION     [facility]  . CARDIOVERSION N/A 08/15/2018    Procedure: CARDIOVERSION;  Surgeon: [name], MD;  Location: [facility] ORS;  Service: Cardiovascular;  Laterality: N/A;  . CARDIOVERSION N/A 11/14/2018   Procedure: CARDIOVERSION (CATH LAB);  Surgeon: [name], MD;  Location: [facility] ORS;  Service: Cardiovascular;  Laterality: N/A;  . CARDIOVERSION N/A 06/05/2019   Procedure: CARDIOVERSION;  Surgeon: [name], MD;  Location: [facility] ORS;  Service: Cardiovascular;  Laterality: N/A;  . CARDIOVERSION N/A 08/14/2019   Procedure: CARDIOVERSION;  Surgeon: [name], MD;  Location: [facility] ORS;  Service: Cardiovascular;  Laterality: N/A;  . CARDIOVERSION N/A 11/09/2019   Procedure: CARDIOVERSION;  Surgeon: Isaias Cowman, MD;  Location: [facility] ORS;  Service: Cardiovascular;  Laterality: N/A;  . CARDIOVERSION N/A 01/17/2020   Procedure: CARDIOVERSION;  Surgeon: Isaias Cowman, MD;  Location: [facility] ORS;  Service: Cardiovascular;  Laterality: N/A;  . CARPAL TUNNEL RELEASE    . ELECTROPHYSIOLOGIC STUDY N/A 08/17/2016   Procedure: Cardioversion;  Surgeon: [name], MD;  Location: [facility] ORS;  Service: Cardiovascular;  Laterality: N/A;  . TUMOR EXCISION Left    x3 (arm)    Medical History: Past Medical History:  Diagnosis Date  . 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 s/p bioprosthetic valve replacement 2008 at [facility];  b. 01/2015 Echo: EF 60-65%, no rwma, Gr1 DD, mildly dil LA, nl RV fxn.  . Atrial fibrillation with RVR ([place]) 01/11/2015  . Atrial flutter ([place])    a. 08/2016 s/p DCCV.  Remains on flecainide 50 mg bid.  . Basal skull fracture ([place]) 20 yrs ago  . CHF (congestive heart failure) ([place])   . Chronic respiratory failure ([place])   . COPD (chronic obstructive pulmonary disease) ([place])    a. on home O2 at 2L since 2008  . 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 Essential hypertension 01/24/2015  . History of cardiac cath    a. 2008 prior to Aortic aneurysm repair-->nl cors.  .  History of stress test    a. 10/2015 MV: no ischemia/infarct.  [name] HLD (hyperlipidemia)   . HTN (hypertension)   . Hypothyroidism   . Obesity   . PAF (paroxysmal atrial fibrillation) ([name])    a. on Eliquis; b. CHADS2VASc = 3 (HTN, age x 1, female).  . Paroxysmal atrial fibrillation ([place]) 01/24/2015  . Right upper quadrant abdominal tenderness without rebound tenderness 03/02/2018  . S/P ascending aortic aneurysm repair 2008    Family History: Family History  Problem Relation Age of Onset  . Stroke Father   . Stroke Paternal Grandmother       Review of Systems  Constitutional: Positive for activity change and fatigue. Negative for chills and unexpected weight change.       Fatigue, shoulder pain, and shortness of breath, limit her ability to do activities of daily living.  HENT: Negative for congestion, postnasal drip, rhinorrhea, sneezing and sore throat.   Respiratory: Positive for shortness of breath and wheezing. Negative for cough and chest tightness.        Scheduled to see pulmonology for breathing issues.   Cardiovascular: Negative for chest pain and palpitations.       Patient has history of congestive heart failure and now has pacemaker.  Gastrointestinal: Negative for abdominal pain, constipation, diarrhea, nausea and vomiting.  Endocrine: Negative for cold intolerance, heat intolerance, polydipsia and polyuria.  Genitourinary: Negative for dysuria and frequency.  Musculoskeletal: Positive for arthralgias. Negative for back pain, joint swelling and neck pain.       Left shoulder pain  Skin: Negative for rash.  Allergic/Immunologic: Positive for environmental allergies.  Neurological: Negative for dizziness, tremors, numbness and headaches.  Hematological: Negative for adenopathy. Does not bruise/bleed easily.  Psychiatric/Behavioral: Positive for dysphoric mood. Negative for behavioral problems (Depression), sleep disturbance and suicidal ideas. The patient is  nervous/anxious.      Today's Vitals   07/11/20 1509  BP: (!) 148/79  Pulse: 91  Resp: 16  Temp: (!) 97.5 F (36.4 C)  SpO2: 95%  Weight: 179 lb 6.4 oz (81.4 kg)  Height: 4\' 10"  (1.473 m)   Body mass index is 37.49 kg/m.  Physical Exam Vitals and nursing note reviewed.  Constitutional:      General: She is not in acute distress.    Appearance: Normal appearance. She is well-developed. She is not diaphoretic.  HENT:     Head: Normocephalic and atraumatic.     Nose: Nose normal.     Mouth/Throat:     Pharynx: No oropharyngeal exudate.  Eyes:     Pupils: Pupils are equal, round, and reactive to light.  Neck:     Thyroid: No thyromegaly.     Vascular: No carotid bruit or JVD.     Trachea: No tracheal deviation.  Cardiovascular:     Rate and Rhythm: Normal rate and regular rhythm.     Pulses: Normal pulses.     Heart sounds: Normal heart sounds. No murmur heard.  No friction rub. No gallop.   Pulmonary:     Effort: Pulmonary effort is normal. No respiratory distress.     Breath sounds: Normal breath sounds. No wheezing or rales.     Comments: Patient using nasal cannula oxygen at all times.  Chest:     Chest wall: No tenderness.  Abdominal:     General: Bowel sounds are normal.     Palpations: Abdomen is soft.     Tenderness: There is no abdominal tenderness.  Musculoskeletal:        General: Normal range of motion.     Cervical back: Normal range of motion and neck supple.  Lymphadenopathy:     Cervical: No cervical adenopathy.  Skin:    General: Skin is warm and dry.     Capillary Refill: Capillary refill takes less than 2 seconds.  Neurological:     Mental Status: She is alert and oriented to person, place, and time. Mental status is at baseline.     Cranial Nerves: No cranial nerve deficit.  Psychiatric:        Attention and Perception: Attention and perception normal.        Mood and Affect: Mood is anxious and depressed.        Speech: Speech normal.         Behavior: Behavior normal. Behavior is cooperative.  Thought Content: Thought content normal.        Cognition and Memory: Cognition and memory normal.        Judgment: Judgment normal.    Depression screen [facility] 2/9 07/11/2020 06/13/2020 05/08/2020 04/22/2020 02/21/2020  Decreased Interest 0 0 0 0 0  Down, Depressed, Hopeless 0 0 0 0 0  PHQ - 2 Score 0 0 0 0 0  Altered sleeping - - - - -  Tired, decreased energy - - - - -  Change in appetite - - - - -  Feeling bad or failure about yourself  - - - - -  Trouble concentrating - - - - -  Moving slowly or fidgety/restless - - - - -  Suicidal thoughts - - - - -  PHQ-9 Score - - - - -  Difficult doing work/chores - - - - -  Some recent data might be hidden    Functional Status Survey: Is the patient deaf or have difficulty hearing?: Yes Does the patient have difficulty seeing, even when wearing glasses/contacts?: Yes Does the patient have difficulty concentrating, remembering, or making decisions?: Yes Does the patient have difficulty walking or climbing stairs?: Yes Does the patient have difficulty dressing or bathing?: No Does the patient have difficulty doing errands alone such as visiting a doctor's office or shopping?: No  MMSE - [place] Exam 07/11/2020 07/10/2019 07/07/2018  Orientation to time 5 5 5   Orientation to Place 5 5 5   Registration 3 3 3   Attention/ Calculation 5 5 5   Recall 3 3 3   Language- name 2 objects 2 2 2   Language- repeat 1 1 1   Language- follow 3 step command 3 3 3   Language- read & follow direction 1 1 1   Write a sentence 1 1 1   Copy design 1 1 1   Total score 30 30 30     Fall Risk  07/11/2020 06/13/2020 05/21/2020 05/08/2020 04/22/2020  Falls in the past year? 1 1 1 1 1   Number falls in past yr: 0 1 0 0 1  Injury with Fall? 0 1 1 0 0  Comment - sprained left ankle - - -  Risk for fall due to : - - History of fall(s) - -  Follow up - - Falls evaluation completed - -      LABS: Recent  Results (from the past 2160 hour(s))  SARS Coronavirus 2 by RT PCR (hospital order, performed in [facility] hospital lab) Nasopharyngeal Nasopharyngeal Swab     Status: None   Collection Time: 05/11/20 12:19 PM   Specimen: Nasopharyngeal Swab  Result Value Ref Range   SARS Coronavirus 2 NEGATIVE NEGATIVE    Comment: (NOTE) SARS-CoV-2 target nucleic acids are NOT DETECTED.  The SARS-CoV-2 RNA is generally detectable in upper and lower respiratory specimens during the acute phase of infection. The lowest concentration of SARS-CoV-2 viral copies this assay can detect is 250 copies / mL. A negative result does not preclude SARS-CoV-2 infection and should not be used as the sole basis for treatment or other patient management decisions.  A negative result may occur with improper specimen collection / handling, submission of specimen other than nasopharyngeal swab, presence of viral mutation(s) within the areas targeted by this assay, and inadequate number of viral copies (<250 copies / mL). A negative result must be combined with clinical observations, patient history, and epidemiological information.  Fact Sheet for Patients:   StrictlyIdeas.no  Fact Sheet for Healthcare Providers: BankingDealers.co.za  This  test is not yet approved or  cleared by the [place] and has been authorized for detection and/or diagnosis of SARS-CoV-2 by FDA under an Emergency Use Authorization (EUA).  This EUA will remain in effect (meaning this test can be used) for the duration of the COVID-19 declaration under Section 564(b)(1) of the Act, 21 U.S.C. section 360bbb-3(b)(1), unless the authorization is terminated or revoked sooner.  Performed at [facility], [address] [place], [place] [address]   CBC with Differential     Status: Abnormal   Collection Time: 05/11/20 12:19 PM  Result Value Ref Range   WBC 7.6 4.0 - 10.5 K/uL    RBC 4.37 3.87 - 5.11 MIL/uL   Hemoglobin 11.3 (L) 12.0 - 15.0 g/dL   HCT 35.6 (L) 36 - 46 %   MCV 81.5 80.0 - 100.0 fL   MCH 25.9 (L) 26.0 - 34.0 pg   MCHC 31.7 30.0 - 36.0 g/dL   RDW 18.0 (H) 11.5 - 15.5 %   Platelets 372 150 - 400 K/uL   nRBC 0.0 0.0 - 0.2 %   Neutrophils Relative % 67 %   Neutro Abs 5.1 1.7 - 7.7 K/uL   Lymphocytes Relative 17 %   Lymphs Abs 1.3 0.7 - 4.0 K/uL   Monocytes Relative 15 %   Monocytes Absolute 1.1 (H) 0.1 - 1.0 K/uL   Eosinophils Relative 0 %   Eosinophils Absolute 0.0 0.0 - 0.5 K/uL   Basophils Relative 0 %   Basophils Absolute 0.0 0.0 - 0.1 K/uL   Immature Granulocytes 1 %   Abs Immature Granulocytes 0.04 0.00 - 0.07 K/uL    Comment: Performed at [facility], [address] [place], [place] [address]  Comprehensive metabolic panel     Status: Abnormal   Collection Time: 05/11/20 12:19 PM  Result Value Ref Range   Sodium 134 (L) 135 - 145 mmol/L   Potassium 3.5 3.5 - 5.1 mmol/L   Chloride 98 98 - 111 mmol/L   CO2 28 22 - 32 mmol/L   Glucose, Bld 106 (H) 70 - 99 mg/dL    Comment: Glucose reference range applies only to samples taken after fasting for at least 8 hours.   BUN 17 8 - 23 mg/dL   Creatinine, Ser 0.77 0.44 - 1.00 mg/dL   Calcium 9.1 8.9 - 10.3 mg/dL   Total Protein 7.0 6.5 - 8.1 g/dL   Albumin 3.8 3.5 - 5.0 g/dL   AST 24 15 - 41 U/L   ALT 28 0 - 44 U/L   Alkaline Phosphatase 54 38 - 126 U/L   Total Bilirubin 0.7 0.3 - 1.2 mg/dL   GFR calc non Af Amer >60 >60 mL/min   GFR calc Af Amer >60 >60 mL/min   Anion gap 8 5 - 15    Comment: Performed at [facility], [place], [place] [address]  Troponin I (High Sensitivity)     Status: Abnormal   Collection Time: 05/11/20 12:19 PM  Result Value Ref Range   Troponin I (High Sensitivity) 20 (H) <18 ng/L    Comment: (NOTE) Elevated high sensitivity troponin I (hsTnI) values and significant  changes across serial measurements may suggest ACS but many  other  chronic and acute conditions are known to elevate hsTnI results.  Refer to the "Links" section for chest pain algorithms and additional  guidance. Performed at [facility], [address] [place], [place] [address]  Procalcitonin - Baseline     Status: None   Collection Time: 05/11/20 12:19 PM  Result Value Ref Range   Procalcitonin <0.10 ng/mL    Comment:        Interpretation: PCT (Procalcitonin) <= 0.5 ng/mL: Systemic infection (sepsis) is not likely. Local bacterial infection is possible. (NOTE)       Sepsis PCT Algorithm           Lower Respiratory Tract                                      Infection PCT Algorithm    ----------------------------     ----------------------------         PCT < 0.25 ng/mL                PCT < 0.10 ng/mL          Strongly encourage             Strongly discourage   discontinuation of antibiotics    initiation of antibiotics    ----------------------------     -----------------------------       PCT 0.25 - 0.50 ng/mL            PCT 0.10 - 0.25 ng/mL               OR       >80% decrease in PCT            Discourage initiation of                                            antibiotics      Encourage discontinuation           of antibiotics    ----------------------------     -----------------------------         PCT >= 0.50 ng/mL              PCT 0.26 - 0.50 ng/mL               AND        <80% decrease in PCT             Encourage initiation of                                             antibiotics       Encourage continuation           of antibiotics    ----------------------------     -----------------------------        PCT >= 0.50 ng/mL                  PCT > 0.50 ng/mL               AND         increase in PCT                  Strongly encourage                                      initiation  of antibiotics    Strongly encourage escalation           of antibiotics                                      -----------------------------                                           PCT <= 0.25 ng/mL                                                 OR                                        > 80% decrease in PCT                                      Discontinue / Do not initiate                                             antibiotics  Performed at [facility], [address] [place], [place] [address]   Magnesium     Status: None   Collection Time: 05/11/20 12:19 PM  Result Value Ref Range   Magnesium 2.1 1.7 - 2.4 mg/dL    Comment: Performed at [facility], [address] [place], [place] [address]  Brain natriuretic peptide     Status: Abnormal   Collection Time: 05/11/20 12:19 PM  Result Value Ref Range   B Natriuretic Peptide 1,961.1 (H) 0.0 - 100.0 pg/mL    Comment: Performed at [facility], [address] [place], [place] [address]  Blood gas, venous     Status: Abnormal   Collection Time: 05/11/20  1:00 PM  Result Value Ref Range   pH, Ven 7.41 7.25 - 7.43   pCO2, Ven 48 44 - 60 mmHg   pO2, Ven 89.0 (H) 32 - 45 mmHg   Bicarbonate 30.4 (H) 20.0 - 28.0 mmol/L   Acid-Base Excess 4.9 (H) 0.0 - 2.0 mmol/L   O2 Saturation 96.9 %   Patient temperature 37.0    Collection site VEIN    Sample type VEIN     Comment: Performed at [facility], [place], [place] [address]  Troponin I (High Sensitivity)     Status: None   Collection Time: 05/11/20  6:42 PM  Result Value Ref Range   Troponin I (High Sensitivity) 17 <18 ng/L    Comment: (NOTE) Elevated high sensitivity troponin I (hsTnI) values and significant  changes across serial measurements may suggest ACS but many other  chronic and acute conditions are known to elevate hsTnI results.  Refer to the "Links" section for chest pain algorithms and additional  guidance. Performed at [facility], [address], [place], [place] [address]   Basic metabolic panel  Status: Abnormal   Collection Time: 05/12/20  5:51 AM  Result Value Ref Range   Sodium 135 135 - 145 mmol/L   Potassium 3.2 (L) 3.5 - 5.1 mmol/L   Chloride 95 (L) 98 - 111 mmol/L   CO2 31 22 - 32 mmol/L   Glucose, Bld 114 (H) 70 - 99 mg/dL    Comment: Glucose reference range applies only to samples taken after fasting for at least 8 hours.   BUN 19 8 - 23 mg/dL   Creatinine, Ser 0.89 0.44 - 1.00 mg/dL   Calcium 9.3 8.9 - 10.3 mg/dL   GFR calc non Af Amer >60 >60 mL/min   GFR calc Af Amer >60 >60 mL/min   Anion gap 9 5 - 15    Comment: Performed at [facility], [address] [place], [place] [address]  CBC with Differential/Platelet     Status: Abnormal   Collection Time: 05/12/20  8:15 AM  Result Value Ref Range   WBC 7.4 4.0 - 10.5 K/uL   RBC 4.20 3.87 - 5.11 MIL/uL   Hemoglobin 11.0 (L) 12.0 - 15.0 g/dL   HCT 34.6 (L) 36 - 46 %   MCV 82.4 80.0 - 100.0 fL   MCH 26.2 26.0 - 34.0 pg   MCHC 31.8 30.0 - 36.0 g/dL   RDW 17.9 (H) 11.5 - 15.5 %   Platelets 340 150 - 400 K/uL   nRBC 0.0 0.0 - 0.2 %   Neutrophils Relative % 67 %   Neutro Abs 4.9 1.7 - 7.7 K/uL   Lymphocytes Relative 19 %   Lymphs Abs 1.4 0.7 - 4.0 K/uL   Monocytes Relative 14 %   Monocytes Absolute 1.1 (H) 0.1 - 1.0 K/uL   Eosinophils Relative 0 %   Eosinophils Absolute 0.0 0.0 - 0.5 K/uL   Basophils Relative 0 %   Basophils Absolute 0.0 0.0 - 0.1 K/uL   Immature Granulocytes 0 %   Abs Immature Granulocytes 0.03 0.00 - 0.07 K/uL    Comment: Performed at [facility], [address] [place], [place] [address]  Magnesium     Status: None   Collection Time: 05/13/20  5:17 AM  Result Value Ref Range   Magnesium 2.0 1.7 - 2.4 mg/dL    Comment: Performed at [facility], [address] [place], [place] [address]  Brain natriuretic peptide     Status: Abnormal   Collection Time: 05/13/20  5:49 AM  Result Value Ref Range   B Natriuretic Peptide 1,048.5 (H) 0.0 - 100.0 pg/mL     Comment: Performed at [facility], [address] [place], [place] [address]  Basic metabolic panel     Status: Abnormal   Collection Time: 05/13/20  5:49 AM  Result Value Ref Range   Sodium 138 135 - 145 mmol/L   Potassium 2.8 (L) 3.5 - 5.1 mmol/L   Chloride 97 (L) 98 - 111 mmol/L   CO2 33 (H) 22 - 32 mmol/L   Glucose, Bld 85 70 - 99 mg/dL    Comment: Glucose reference range applies only to samples taken after fasting for at least 8 hours.   BUN 25 (H) 8 - 23 mg/dL   Creatinine, Ser 0.82 0.44 - 1.00 mg/dL   Calcium 8.9 8.9 - 10.3 mg/dL   GFR calc non Af Amer >60 >60 mL/min   GFR calc Af Amer >60 >60 mL/min   Anion gap 8 5 - 15    Comment:  Performed at [facility], [address] [place], [place] [address]  Basic metabolic panel     Status: Abnormal   Collection Time: 05/14/20  7:14 AM  Result Value Ref Range   Sodium 136 135 - 145 mmol/L   Potassium 2.8 (L) 3.5 - 5.1 mmol/L   Chloride 95 (L) 98 - 111 mmol/L   CO2 32 22 - 32 mmol/L   Glucose, Bld 93 70 - 99 mg/dL    Comment: Glucose reference range applies only to samples taken after fasting for at least 8 hours.   BUN 25 (H) 8 - 23 mg/dL   Creatinine, Ser 0.81 0.44 - 1.00 mg/dL   Calcium 8.6 (L) 8.9 - 10.3 mg/dL   GFR calc non Af Amer >60 >60 mL/min   GFR calc Af Amer >60 >60 mL/min   Anion gap 9 5 - 15    Comment: Performed at [facility], [address] [place], [place] [address]  Brain natriuretic peptide     Status: Abnormal   Collection Time: 05/14/20  7:14 AM  Result Value Ref Range   B Natriuretic Peptide 638.3 (H) 0.0 - 100.0 pg/mL    Comment: Performed at [facility], [address] [place], [place] [address]  POCT Urine Drug Screen     Status: Abnormal   Collection Time: 06/13/20  3:51 PM  Result Value Ref Range   POC Methamphetamine UR None Detected None Detected   POC Opiate Ur None Detected None Detected   POC Barbiturate UR None Detected None Detected   POC  Amphetamine UR None Detected None Detected   POC Oxycodone UR None Detected None Detected   POC Cocaine UR None Detected None Detected   POC Ecstasy UR None Detected None Detected   POC TRICYCLICS UR None Detected None Detected   POC PHENCYCLIDINE UR None Detected None Detected   POC Marijuana UR None Detected None Detected   POC Methadone UR None Detected None Detected   POC BENZODIAZEPINES UR Positive (A) None Detected   URINE TEMPERATURE     POC DRUG SCREEN OXIDANTS URINE     POC SPECIFIC GRAVITY URINE     POC PH URINE     Methylenedioxyamphetamine      Assessment/Plan: 1. Encounter for general adult medical examination with abnormal findings Annual health maintenance exam today.   2. Chronic obstructive pulmonary disease, unspecified COPD type ([place]) Continue inhalers and respiratory medications as prescribed   3. Oxygen dependent Continue to use nasal cannula oxygen at all times.  4. Essential hypertension Generally stable. Continue medications as prescribed. routine visits with cardiology as scheduled.   5. Paroxysmal atrial fibrillation ([name]) Continue medications as prescribed. routine visits with cardiology as scheduled.   6. GAD (generalized anxiety disorder) May take trazodone 50mg , two tablets at bedtime as needed for insomnia related to anxiety. Continue duloxetine and alprazoalm as prescribed .  General Counseling: Kimm verbalizes understanding of the findings of todays visit and agrees with plan of treatment. I have discussed any further diagnostic evaluation that may be needed or ordered today. We also reviewed her medications today. she has been encouraged to call the office with any questions or concerns that should arise related to todays visit.    Counseling:  This patient was seen by Leretha Pol FNP Collaboration with Dr [name] as a part of collaborative care agreement   Total time spent: 39 Minutes  Time spent includes review of chart,  medications, test results,  and follow up plan with the patient.     [name], MD  Internal Medicine

## 2020-07-15 ENCOUNTER — Other Ambulatory Visit: Payer: Self-pay

## 2020-07-15 MED ORDER — LEVOTHYROXINE SODIUM 25 MCG PO TABS: 25.0000 ug | ORAL_TABLET | Freq: Every day | ORAL | 1 refills | Status: DC

## 2020-07-15 MED ORDER — PANTOPRAZOLE SODIUM 40 MG PO TBEC: 40.0000 mg | DELAYED_RELEASE_TABLET | Freq: Every day | ORAL | 1 refills | Status: DC

## 2020-07-17 ENCOUNTER — Ambulatory Visit: Payer: Medicare Other | Admitting: Internal Medicine

## 2020-07-23 ENCOUNTER — Other Ambulatory Visit: Payer: Self-pay

## 2020-07-23 ENCOUNTER — Ambulatory Visit: Payer: Medicare Other | Admitting: Internal Medicine

## 2020-07-23 ENCOUNTER — Encounter: Payer: Self-pay | Admitting: Internal Medicine

## 2020-07-23 DIAGNOSIS — R0602 Shortness of breath: Secondary | ICD-10-CM | POA: Diagnosis not present

## 2020-07-23 DIAGNOSIS — G4733 Obstructive sleep apnea (adult) (pediatric): Secondary | ICD-10-CM

## 2020-07-23 DIAGNOSIS — J9611 Chronic respiratory failure with hypoxia: Secondary | ICD-10-CM

## 2020-07-23 DIAGNOSIS — Z9989 Dependence on other enabling machines and devices: Secondary | ICD-10-CM

## 2020-07-23 DIAGNOSIS — I5032 Chronic diastolic (congestive) heart failure: Secondary | ICD-10-CM

## 2020-07-23 NOTE — Patient Instructions (Signed)

## 2020-07-23 NOTE — Progress Notes (Signed)
[facility] [place], [place] [address]  Pulmonary Sleep Medicine   Office Visit Note  Patient Name: [name] DOB: 1946/03/08 MRN [id number]  Date of Service: 07/23/2020  Complaints/HPI: Patient is here for routine pulmonary follow-up Feels her breathing is stable--still wearing 3LPM via [place] supplemental oxygen at all times during the day, at baseline does have shortness of breath with short distance or with routine house work Also wears CPAP at night-bleeds supplemental oxygen through CPAP each night, reports nightly compliance with CPAP, denies congestion or dryness, feels rested throughout the day, no symptoms of headaches or bloating upon awakening  ROS  General: (-) fever, (-) chills, (-) night sweats, (-) weakness Skin: (-) rashes, (-) itching,. Eyes: (-) visual changes, (-) redness, (-) itching. Nose and Sinuses: (-) nasal stuffiness or itchiness, (-) postnasal drip, (-) nosebleeds, (-) sinus trouble. Mouth and Throat: (-) sore throat, (-) hoarseness. Neck: (-) swollen glands, (-) enlarged thyroid, (-) neck pain. Respiratory: - cough, (-) bloody sputum, + shortness of breath, - wheezing. Cardiovascular: - ankle swelling, (-) chest pain. Lymphatic: (-) lymph node enlargement. Neurologic: (-) numbness, (-) tingling. Psychiatric: (-) anxiety, (-) depression   Current Medication: Outpatient Encounter Medications as of 07/23/2020  Medication Sig  . acetaminophen (TYLENOL) 500 MG tablet Take 500 mg by mouth every 4 (four) hours as needed for moderate pain or headache.   . albuterol (PROVENTIL) (2.5 MG/3ML) 0.083% nebulizer solution Take 3 mLs (2.5 mg total) by nebulization every 6 (six) hours as needed for wheezing or shortness of breath.  [name] albuterol (VENTOLIN HFA) 108 (90 Base) MCG/ACT inhaler Inhale 2 puffs into the lungs every 6 (six) hours as needed for wheezing or shortness of breath.  . ALPRAZolam (XANAX) 0.25 MG tablet Take 1 tablet (0.25  mg total) by mouth 2 (two) times daily as needed for anxiety.  [name] atorvastatin (LIPITOR) 40 MG tablet 1 tab po qhs  . bumetanide (BUMEX) 1 MG tablet Take 1 tablet (1 mg total) by mouth 2 (two) times daily.  . Calcium Carbonate-Vitamin D (CALCIUM 600+D PO) Take 1 tablet by mouth daily.  . Coenzyme Q10 (COQ10) 200 MG CAPS Take 200 mg by mouth daily.  . DULoxetine (CYMBALTA) 60 MG capsule Take 1 capsule (60 mg total) by mouth 2 (two) times daily.  [name] ELIQUIS 5 MG TABS tablet Take 1 tablet by mouth twice daily. (Patient taking differently: Take 5 mg by mouth 2 (two) times daily. )  . ferrous sulfate (FERROUSUL) 325 (65 FE) MG tablet Take 650 mg by mouth daily with breakfast.   . flecainide (TAMBOCOR) 50 MG tablet Take 1 tablet (50 mg total) by mouth 2 (two) times daily. PLEASE CONTACT OFFICE [phone] TO SCHEDULE APPOINTMENT FOR FUTURE REFILLS  . ipratropium-albuterol (DUONEB) 0.5-2.5 (3) MG/3ML SOLN Take 3 mLs by nebulization every 6 (six) hours as needed.  [name] levothyroxine (SYNTHROID) 25 MCG tablet Take 1 tablet (25 mcg total) by mouth daily before breakfast.  . pantoprazole (PROTONIX) 40 MG tablet Take 1 tablet (40 mg total) by mouth daily.  . potassium chloride (KLOR-CON) 10 MEQ tablet Take 2 tablets (20 mEq total) by mouth 2 (two) times daily.  . traZODone (DESYREL) 50 MG tablet Take 50 mg by mouth at bedtime.  . TRELEGY ELLIPTA 100-62.5-25 MCG/INH AEPB INHALE 1 PUFF INTO THE LUNGS DAILY   No facility-administered encounter medications on file as of 07/23/2020.    Surgical History: Past Surgical History:  Procedure Laterality Date  . ABDOMINAL AORTIC  ANEURYSM REPAIR  2008  . ABDOMINAL HYSTERECTOMY    . AORTIC VALVE REPLACEMENT  2008  . CARDIAC CATHETERIZATION     [facility]  . CARDIOVERSION N/A 08/15/2018   Procedure: CARDIOVERSION;  Surgeon: [name], MD;  Location: [facility] ORS;  Service: Cardiovascular;  Laterality: N/A;  . CARDIOVERSION N/A 11/14/2018   Procedure: CARDIOVERSION (CATH  LAB);  Surgeon: [name], MD;  Location: [facility] ORS;  Service: Cardiovascular;  Laterality: N/A;  . CARDIOVERSION N/A 06/05/2019   Procedure: CARDIOVERSION;  Surgeon: [name], MD;  Location: [facility] ORS;  Service: Cardiovascular;  Laterality: N/A;  . CARDIOVERSION N/A 08/14/2019   Procedure: CARDIOVERSION;  Surgeon: [name], MD;  Location: [facility] ORS;  Service: Cardiovascular;  Laterality: N/A;  . CARDIOVERSION N/A 11/09/2019   Procedure: CARDIOVERSION;  Surgeon: Isaias Cowman, MD;  Location: [facility] ORS;  Service: Cardiovascular;  Laterality: N/A;  . CARDIOVERSION N/A 01/17/2020   Procedure: CARDIOVERSION;  Surgeon: Isaias Cowman, MD;  Location: [facility] ORS;  Service: Cardiovascular;  Laterality: N/A;  . CARPAL TUNNEL RELEASE    . ELECTROPHYSIOLOGIC STUDY N/A 08/17/2016   Procedure: Cardioversion;  Surgeon: [name], MD;  Location: [facility] ORS;  Service: Cardiovascular;  Laterality: N/A;  . TUMOR EXCISION Left    x3 (arm)    Medical History: Past Medical History:  Diagnosis Date  . 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 s/p bioprosthetic valve replacement 2008 at [facility];  b. 01/2015 Echo: EF 60-65%, no rwma, Gr1 DD, mildly dil LA, nl RV fxn.  . Atrial fibrillation with RVR ([place]) 01/11/2015  . Atrial flutter ([place])    a. 08/2016 s/p DCCV.  Remains on flecainide 50 mg bid.  . Basal skull fracture ([place]) 20 yrs ago  . CHF (congestive heart failure) ([place])   . Chronic respiratory failure ([place])   . COPD (chronic obstructive pulmonary disease) ([place])    a. on home O2 at 2L since 2008  . 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 Essential hypertension 01/24/2015  . History of cardiac cath    a. 2008 prior to Aortic aneurysm repair-->nl cors.  . History of stress test    a. 10/2015 MV: no ischemia/infarct.  [name] HLD (hyperlipidemia)   . HTN (hypertension)   . Hypothyroidism   . Obesity   . PAF (paroxysmal atrial fibrillation) ([name])     a. on Eliquis; b. CHADS2VASc = 3 (HTN, age x 1, female).  . Paroxysmal atrial fibrillation ([place]) 01/24/2015  . Right upper quadrant abdominal tenderness without rebound tenderness 03/02/2018  . S/P ascending aortic aneurysm repair 2008    Family History: Family History  Problem Relation Age of Onset  . Stroke Father   . Stroke Paternal Grandmother     Social History: Social History   Socioeconomic History  . Marital status: Single    Spouse name: Not on file  . Number of children: Not on file  . Years of education: Not on file  . Highest education level: Not on file  Occupational History  . Not on file  Tobacco Use  . Smoking status: Former Smoker    Types: Cigarettes  . Smokeless tobacco: Never Used  Vaping Use  . Vaping Use: Never used  Substance and Sexual Activity  . Alcohol use: No  . Drug use: No  . Sexual activity: Never    Birth control/protection: Surgical  Other Topics Concern  . Not on file  Social History Narrative  . Not on file   Social Determinants of Health   Financial Resource Strain:   . Difficulty of Paying Living Expenses: Not on file  Food Insecurity:   . Worried About Charity fundraiser in the Last Year: Not on file  . Ran Out of Food in the Last Year: Not on file  Transportation Needs:   . Lack of Transportation (Medical): Not on file  . Lack of Transportation (Non-Medical): Not on file  Physical Activity:   . Days of Exercise per Week: Not on file  . Minutes of Exercise per Session: Not on file  Stress:   . Feeling of Stress : Not on file  Social Connections:   . Frequency of Communication with Friends and Family: Not on file  . Frequency of Social Gatherings with Friends and Family: Not on file  . Attends Religious Services: Not on file  . Active Member of Clubs or Organizations: Not on file  . Attends Archivist Meetings: Not on file  . Marital Status: Not on file  Intimate Partner Violence:   . Fear of Current or  Ex-Partner: Not on file  . Emotionally Abused: Not on file  . Physically Abused: Not on file  . Sexually Abused: Not on file    Vital Signs: Blood pressure 130/80, pulse 93, temperature (!) 97.4 F (36.3 C), resp. rate 16, height 4\' 10"  (1.473 m), weight 179 lb (81.2 kg), SpO2 94 %.  Examination: General Appearance: The patient is well-developed, well-nourished, and in no distress. Skin: Gross inspection of skin unremarkable. Head: normocephalic, no gross deformities. Eyes: no gross deformities noted. ENT: ears appear grossly normal no exudates. Neck: Supple. No thyromegaly. No LAD. Respiratory: Diminished throughout, no rhonchi, wheezing or rales noted. Cardiovascular: Normal S1 and S2 without murmur or rub. Extremities: No cyanosis. pulses are equal. Neurologic: Alert and oriented. No involuntary movements.  LABS: Recent Results (from the past 2160 hour(s))  SARS Coronavirus 2 by RT PCR (hospital order, performed in [facility] hospital lab) Nasopharyngeal Nasopharyngeal Swab     Status: None   Collection Time: 05/11/20 12:19 PM   Specimen: Nasopharyngeal Swab  Result Value Ref Range   SARS Coronavirus 2 NEGATIVE NEGATIVE    Comment: (NOTE) SARS-CoV-2 target nucleic acids are NOT DETECTED.  The SARS-CoV-2 RNA is generally detectable in upper and lower respiratory specimens during the acute phase of infection. The lowest concentration of SARS-CoV-2 viral copies this assay can detect is 250 copies / mL. A negative result does not preclude SARS-CoV-2 infection and should not be used as the sole basis for treatment or other patient management decisions.  A negative result may occur with improper specimen collection / handling, submission of specimen other than nasopharyngeal swab, presence of viral mutation(s) within the areas targeted by this assay, and inadequate number of viral copies (<250 copies / mL). A negative result must be combined with clinical observations, patient  history, and epidemiological information.  Fact Sheet for Patients:   StrictlyIdeas.no  Fact Sheet for Healthcare Providers: BankingDealers.co.za  This test is not yet approved or  cleared by the [place] FDA and has been authorized for detection and/or diagnosis of SARS-CoV-2 by FDA under an Emergency Use Authorization (EUA).  This EUA will remain in effect (meaning this test can be used) for the duration of the COVID-19 declaration under Section 564(b)(1) of the Act, 21 U.S.C. section 360bbb-3(b)(1), unless the authorization is terminated or revoked sooner.  Performed at [facility], [address] [place], [place] [address]   CBC with Differential     Status: Abnormal   Collection Time: 05/11/20 12:19 PM  Result Value Ref Range   WBC 7.6 4.0 - 10.5 K/uL   RBC 4.37 3.87 - 5.11 MIL/uL   Hemoglobin 11.3 (L) 12.0 - 15.0 g/dL   HCT 35.6 (L) 36 - 46 %   MCV 81.5 80.0 - 100.0 fL   MCH 25.9 (L) 26.0 - 34.0 pg   MCHC 31.7 30.0 - 36.0 g/dL   RDW 18.0 (H) 11.5 - 15.5 %   Platelets 372 150 - 400 K/uL   nRBC 0.0 0.0 - 0.2 %   Neutrophils Relative % 67 %   Neutro Abs 5.1 1.7 - 7.7 K/uL   Lymphocytes Relative 17 %   Lymphs Abs 1.3 0.7 - 4.0 K/uL   Monocytes Relative 15 %   Monocytes Absolute 1.1 (H) 0.1 - 1.0 K/uL   Eosinophils Relative 0 %   Eosinophils Absolute 0.0 0.0 - 0.5 K/uL   Basophils Relative 0 %   Basophils Absolute 0.0 0.0 - 0.1 K/uL   Immature Granulocytes 1 %   Abs Immature Granulocytes 0.04 0.00 - 0.07 K/uL    Comment: Performed at [facility], [address] [place], [place] [address]  Comprehensive metabolic panel     Status: Abnormal   Collection Time: 05/11/20 12:19 PM  Result Value Ref Range   Sodium 134 (L) 135 - 145 mmol/L   Potassium 3.5 3.5 - 5.1 mmol/L   Chloride 98 98 - 111 mmol/L   CO2 28 22 - 32 mmol/L   Glucose, Bld 106 (H) 70 - 99 mg/dL    Comment: Glucose reference range  applies only to samples taken after fasting for at least 8 hours.   BUN 17 8 - 23 mg/dL   Creatinine, Ser 0.77 0.44 - 1.00 mg/dL   Calcium 9.1 8.9 - 10.3 mg/dL   Total Protein 7.0 6.5 - 8.1 g/dL   Albumin 3.8 3.5 - 5.0 g/dL   AST 24 15 - 41 U/L   ALT 28 0 - 44 U/L   Alkaline Phosphatase 54 38 - 126 U/L   Total Bilirubin 0.7 0.3 - 1.2 mg/dL   GFR calc non Af Amer >60 >60 mL/min   GFR calc Af Amer >60 >60 mL/min   Anion gap 8 5 - 15    Comment: Performed at [facility], [place], [place] [address]  Troponin I (High Sensitivity)     Status: Abnormal   Collection Time: 05/11/20 12:19 PM  Result Value Ref Range   Troponin I (High Sensitivity) 20 (H) <18 ng/L    Comment: (NOTE) Elevated high sensitivity troponin I (hsTnI) values and significant  changes across serial measurements may suggest ACS but many other  chronic and acute conditions are known to elevate hsTnI results.  Refer to the "Links" section for chest pain algorithms and additional  guidance. Performed at [facility], [address] [place], [place] [address]   Procalcitonin - Baseline     Status: None   Collection Time: 05/11/20 12:19 PM  Result Value Ref Range   Procalcitonin <0.10 ng/mL    Comment:        Interpretation: PCT (Procalcitonin) <= 0.5 ng/mL: Systemic infection (sepsis) is not likely. Local bacterial infection is possible. (NOTE)       Sepsis PCT Algorithm  Lower Respiratory Tract                                      Infection PCT Algorithm    ----------------------------     ----------------------------         PCT < 0.25 ng/mL                PCT < 0.10 ng/mL          Strongly encourage             Strongly discourage   discontinuation of antibiotics    initiation of antibiotics    ----------------------------     -----------------------------       PCT 0.25 - 0.50 ng/mL            PCT 0.10 - 0.25 ng/mL               OR       >80% decrease in PCT             Discourage initiation of                                            antibiotics      Encourage discontinuation           of antibiotics    ----------------------------     -----------------------------         PCT >= 0.50 ng/mL              PCT 0.26 - 0.50 ng/mL               AND        <80% decrease in PCT             Encourage initiation of                                             antibiotics       Encourage continuation           of antibiotics    ----------------------------     -----------------------------        PCT >= 0.50 ng/mL                  PCT > 0.50 ng/mL               AND         increase in PCT                  Strongly encourage                                      initiation of antibiotics    Strongly encourage escalation           of antibiotics                                     -----------------------------  PCT <= 0.25 ng/mL                                                 OR                                        > 80% decrease in PCT                                      Discontinue / Do not initiate                                             antibiotics  Performed at [facility], [address] [place], [place] [address]   Magnesium     Status: None   Collection Time: 05/11/20 12:19 PM  Result Value Ref Range   Magnesium 2.1 1.7 - 2.4 mg/dL    Comment: Performed at [facility], [address] [place], [place] [address]  Brain natriuretic peptide     Status: Abnormal   Collection Time: 05/11/20 12:19 PM  Result Value Ref Range   B Natriuretic Peptide 1,961.1 (H) 0.0 - 100.0 pg/mL    Comment: Performed at [facility], [address] [place], [place] [address]  Blood gas, venous     Status: Abnormal   Collection Time: 05/11/20  1:00 PM  Result Value Ref Range   pH, Ven 7.41 7.25 - 7.43   pCO2, Ven 48 44 - 60 mmHg   pO2, Ven 89.0 (H) 32 - 45 mmHg   Bicarbonate 30.4  (H) 20.0 - 28.0 mmol/L   Acid-Base Excess 4.9 (H) 0.0 - 2.0 mmol/L   O2 Saturation 96.9 %   Patient temperature 37.0    Collection site VEIN    Sample type VEIN     Comment: Performed at [facility], [place], [place] [address]  Troponin I (High Sensitivity)     Status: None   Collection Time: 05/11/20  6:42 PM  Result Value Ref Range   Troponin I (High Sensitivity) 17 <18 ng/L    Comment: (NOTE) Elevated high sensitivity troponin I (hsTnI) values and significant  changes across serial measurements may suggest ACS but many other  chronic and acute conditions are known to elevate hsTnI results.  Refer to the "Links" section for chest pain algorithms and additional  guidance. Performed at [facility], [address] [place], [place] [address]   Basic metabolic panel     Status: Abnormal   Collection Time: 05/12/20  5:51 AM  Result Value Ref Range   Sodium 135 135 - 145 mmol/L   Potassium 3.2 (L) 3.5 - 5.1 mmol/L   Chloride 95 (L) 98 - 111 mmol/L   CO2 31 22 - 32 mmol/L   Glucose, Bld 114 (H) 70 - 99 mg/dL    Comment: Glucose reference range applies only to samples taken after fasting for at least 8 hours.   BUN 19 8 - 23 mg/dL   Creatinine, Ser 0.89 0.44 - 1.00 mg/dL  Calcium 9.3 8.9 - 10.3 mg/dL   GFR calc non Af Amer >60 >60 mL/min   GFR calc Af Amer >60 >60 mL/min   Anion gap 9 5 - 15    Comment: Performed at [facility], [address] [place], [place] [address]  CBC with Differential/Platelet     Status: Abnormal   Collection Time: 05/12/20  8:15 AM  Result Value Ref Range   WBC 7.4 4.0 - 10.5 K/uL   RBC 4.20 3.87 - 5.11 MIL/uL   Hemoglobin 11.0 (L) 12.0 - 15.0 g/dL   HCT 34.6 (L) 36 - 46 %   MCV 82.4 80.0 - 100.0 fL   MCH 26.2 26.0 - 34.0 pg   MCHC 31.8 30.0 - 36.0 g/dL   RDW 17.9 (H) 11.5 - 15.5 %   Platelets 340 150 - 400 K/uL   nRBC 0.0 0.0 - 0.2 %   Neutrophils Relative % 67 %   Neutro Abs 4.9 1.7 - 7.7 K/uL    Lymphocytes Relative 19 %   Lymphs Abs 1.4 0.7 - 4.0 K/uL   Monocytes Relative 14 %   Monocytes Absolute 1.1 (H) 0.1 - 1.0 K/uL   Eosinophils Relative 0 %   Eosinophils Absolute 0.0 0.0 - 0.5 K/uL   Basophils Relative 0 %   Basophils Absolute 0.0 0.0 - 0.1 K/uL   Immature Granulocytes 0 %   Abs Immature Granulocytes 0.03 0.00 - 0.07 K/uL    Comment: Performed at [facility], [address] [place], [place] [address]  Magnesium     Status: None   Collection Time: 05/13/20  5:17 AM  Result Value Ref Range   Magnesium 2.0 1.7 - 2.4 mg/dL    Comment: Performed at [facility], [address] [place], [place] [address]  Brain natriuretic peptide     Status: Abnormal   Collection Time: 05/13/20  5:49 AM  Result Value Ref Range   B Natriuretic Peptide 1,048.5 (H) 0.0 - 100.0 pg/mL    Comment: Performed at [facility], [address] [place], [place] [address]  Basic metabolic panel     Status: Abnormal   Collection Time: 05/13/20  5:49 AM  Result Value Ref Range   Sodium 138 135 - 145 mmol/L   Potassium 2.8 (L) 3.5 - 5.1 mmol/L   Chloride 97 (L) 98 - 111 mmol/L   CO2 33 (H) 22 - 32 mmol/L   Glucose, Bld 85 70 - 99 mg/dL    Comment: Glucose reference range applies only to samples taken after fasting for at least 8 hours.   BUN 25 (H) 8 - 23 mg/dL   Creatinine, Ser 0.82 0.44 - 1.00 mg/dL   Calcium 8.9 8.9 - 10.3 mg/dL   GFR calc non Af Amer >60 >60 mL/min   GFR calc Af Amer >60 >60 mL/min   Anion gap 8 5 - 15    Comment: Performed at [facility], [address] [place], [place] [address]  Basic metabolic panel     Status: Abnormal   Collection Time: 05/14/20  7:14 AM  Result Value Ref Range   Sodium 136 135 - 145 mmol/L   Potassium 2.8 (L) 3.5 - 5.1 mmol/L   Chloride 95 (L) 98 - 111 mmol/L   CO2 32 22 - 32 mmol/L   Glucose, Bld 93 70 - 99 mg/dL    Comment: Glucose reference range applies only to samples taken after fasting for at  least 8  hours.   BUN 25 (H) 8 - 23 mg/dL   Creatinine, Ser 0.81 0.44 - 1.00 mg/dL   Calcium 8.6 (L) 8.9 - 10.3 mg/dL   GFR calc non Af Amer >60 >60 mL/min   GFR calc Af Amer >60 >60 mL/min   Anion gap 9 5 - 15    Comment: Performed at [facility], [address] [place], [place] [address]  Brain natriuretic peptide     Status: Abnormal   Collection Time: 05/14/20  7:14 AM  Result Value Ref Range   B Natriuretic Peptide 638.3 (H) 0.0 - 100.0 pg/mL    Comment: Performed at [facility], [address] [place], [place] [address]  POCT Urine Drug Screen     Status: Abnormal   Collection Time: 06/13/20  3:51 PM  Result Value Ref Range   POC Methamphetamine UR None Detected None Detected   POC Opiate Ur None Detected None Detected   POC Barbiturate UR None Detected None Detected   POC Amphetamine UR None Detected None Detected   POC Oxycodone UR None Detected None Detected   POC Cocaine UR None Detected None Detected   POC Ecstasy UR None Detected None Detected   POC TRICYCLICS UR None Detected None Detected   POC PHENCYCLIDINE UR None Detected None Detected   POC Marijuana UR None Detected None Detected   POC Methadone UR None Detected None Detected   POC BENZODIAZEPINES UR Positive (A) None Detected   URINE TEMPERATURE     POC DRUG SCREEN OXIDANTS URINE     POC SPECIFIC GRAVITY URINE     POC [place] URINE     Methylenedioxyamphetamine      Radiology: DG Chest 1 View  Result Date: 05/11/2020 CLINICAL DATA:  Shortness of breath.  On Lasix. EXAM: CHEST  1 VIEW COMPARISON:  03/29/2020 FINDINGS: Right axillary surgical clips. Pacer. Prior median sternotomy. Patient rotated left. Midline trachea. Moderate cardiomegaly. Left larger than right small pleural effusions, similar. No pneumothorax. The Chin overlies the apices minimally. Interstitial prominence and indistinctness, progressive. Left greater than right base airspace disease, increased. IMPRESSION: Since  03/29/2020, worsened aeration. Cardiomegaly with mild interstitial edema and bilateral pleural effusions. Bibasilar airspace disease which could represent atelectasis or concurrent infection. Aortic Atherosclerosis (ICD10-I70.0). Electronically Signed   By: Abigail Miyamoto M.D.   On: 05/11/2020 12:04   CT Chest Wo Contrast  Result Date: 05/11/2020 CLINICAL DATA:  COPD exacerbation.  Shortness of breath. EXAM: CT CHEST WITHOUT CONTRAST TECHNIQUE: Multidetector CT imaging of the chest was performed following the standard protocol without IV contrast. COMPARISON:  Chest CT dated 09/11/2005. Chest x-ray from earlier same day. FINDINGS: Cardiovascular: Cardiomegaly. No pericardial effusion. Pacemaker/ICD apparatus in place. Aortic root hardware in place. Aortic atherosclerosis. No thoracic aortic aneurysm. Mediastinum/Nodes: No mass or enlarged lymph nodes are seen within the mediastinum. Esophagus is unremarkable. Trachea is unremarkable. Lungs/Pleura: Bilateral pleural effusions, small to moderate in size, at least partially loculated on the RIGHT. Associated compressive atelectasis at each lung base. Emphysematous changes within the upper lobes, mild to moderate in degree. Mild interstitial thickening within the upper lobes suggesting mild edema. Upper Abdomen: No acute findings. Multiple small stones within the otherwise normal appearing gallbladder. Musculoskeletal: Degenerative spondylosis of the kyphotic thoracolumbar spine, mild to moderate in degree. Median sternotomy wires in place. No acute appearing osseous abnormality. IMPRESSION: 1. Bilateral pleural effusions, small to moderate in size, at least partially loculated on the RIGHT. Associated atelectasis at the bilateral lung bases.  2. Mild interstitial edema within the upper lobes. 3. No additional consolidation to suggest pneumonia. 4. Cardiomegaly. 5. Cholelithiasis without evidence of acute cholecystitis. Aortic Atherosclerosis (ICD10-I70.0) and Emphysema  (ICD10-J43.9). Electronically Signed   By: Franki Cabot M.D.   On: 05/11/2020 13:51    No results found.  No results found.    Assessment and Plan: Patient Active Problem List   Diagnosis Date Noted  . Acute on chronic diastolic CHF (congestive heart failure) ([place]) 05/11/2020  . COPD (chronic obstructive pulmonary disease) ([place])   . Hypothyroidism   . Acute non-recurrent frontal sinusitis 04/22/2020  . Vertigo 04/22/2020  . S/P placement of cardiac pacemaker 02/21/2020  . Episode of moderate major depression ([place]) 10/10/2019  . Persistent atrial fibrillation ([place])   . Encounter for general adult medical examination with abnormal findings 07/10/2019  . Encounter for screening mammogram for malignant neoplasm of breast 07/10/2019  . Atopic dermatitis 05/02/2019  . Paroxysmal atrial flutter ([place]) 08/11/2018  . Encounter for long-term (current) use of medications 07/09/2018  . Chronic left shoulder pain 07/09/2018  . Conjunctivitis 07/09/2018  . Oxygen dependent 07/09/2018  . GAD (generalized anxiety disorder) 07/09/2018  . Ovarian failure 07/09/2018  . Positive colorectal cancer screening using Cologuard test 04/24/2018  . Calculus of gallbladder with acute on chronic cholecystitis 04/08/2018  . H/O: CVA (cerebrovascular accident) 04/08/2018  . Dysuria 03/02/2018  . Urinary tract infection without hematuria 03/02/2018  . Right upper quadrant abdominal tenderness without rebound tenderness 03/02/2018  . 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 exacerbation ([place]) 03/02/2018  . Chronic obstructive pulmonary disease ([place]) 09/19/2017  . Typical atrial flutter ([place])   . Irritable bowel syndrome without diarrhea 01/29/2015  . Essential hypertension 01/24/2015  . Paroxysmal atrial fibrillation ([place]) 01/24/2015  . History of aortic valve replacement with bioprosthetic valve 01/24/2015  . Atrial fibrillation with RVR ([place]) 01/11/2015  . 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 Impingement syndrome of shoulder 05/18/2014  . Cellulitis and abscess 02/13/2014  . MRSA (methicillin resistant Staphylococcus aureus) 02/13/2014  . Aneurysm, ascending aorta ([place]) 06/23/2013  . Bicuspid aortic valve 06/23/2013    1. Chronic respiratory failure with hypoxia ([name]) Continue with supportive measures, hypoxia controlled and stable with current continuous oxygen at 2-3LPM via   2. OSA on CPAP Continue with nightly compliance, will need an updated download to assess control of OSA   3. Chronic diastolic heart failure ([name]) Stable at this time, no evidence of fluid overload today, followed by cardiology  4. Shortness of breath FEV1 1.0L, 63% predicted--slight worsening since last spirometry, stable compared to PFT 03/2020 - Spirometry with Graph  General Counseling: I have discussed the findings of the evaluation and examination with Namiyah.  I have also discussed any further diagnostic evaluation thatmay be needed or ordered today. Mahogany verbalizes understanding of the findings of todays visit. We also reviewed her medications today and discussed drug interactions and side effects including but not limited excessive drowsiness and altered mental states. We also discussed that there is always a risk not just to her but also people around her. she has been encouraged to call the office with any questions or concerns that should arise related to todays visit.  Orders Placed This Encounter  Procedures  . Spirometry with Graph    Order Specific Question:   Where should this test be performed?    Answer:   Other     Time spent: 30  I have personally obtained a history, examined the  patient, evaluated laboratory and imaging results, formulated the assessment and plan and placed orders. This patient was seen by Casey Burkitt AGNP-C in Collaboration with Dr. Devona Konig as a part of collaborative care agreement.    Allyne Gee, MD  [facility] Pulmonary and Critical Care Sleep medicine

## 2020-07-24 ENCOUNTER — Other Ambulatory Visit: Payer: Self-pay

## 2020-07-26 ENCOUNTER — Encounter: Payer: Self-pay | Admitting: Internal Medicine

## 2020-07-29 ENCOUNTER — Telehealth: Payer: Self-pay | Admitting: Family

## 2020-07-29 MED ORDER — LISINOPRIL 20 MG PO TABS: 20.0000 mg | ORAL_TABLET | Freq: Every day | ORAL | 3 refills | Status: DC

## 2020-07-29 NOTE — Telephone Encounter (Signed)
Patient called to say that her shortness of breath had worsened. She denies any weight gain or any swelling anywhere. She has continued to wear her oxygen and denies having any issues with her oxygen. Says that her oxygen level occasionally dips down into the upper 80's but then quickly recovers back into the 90's.   Says that her BP has been fluctuating and it's been 150-160's on the top number. Denies any change in her diet.   Confirmed her bumex dose and that she is taking lisinopril 10mg  daily.   Will increase her lisinopril to 20mg  daily. She can use up what she has by taking 2 tablets daily until gone and then when she picks up the 20mg  tablet, she will resume taking 1 tablet daily. Patient able to verbalize instructions back to me.   Should she feel like her breathing is worsening or she develops weight gain or swelling, she can take an extra bumex along with extra potassium. Also emphasized with being closed for upcoming Thanksgiving holiday that if things worsen, she should present to the ED for further evaluation.   Currently she has f/u scheduled with me on 08/06/20 and she's planning on keeping that appointment.

## 2020-07-31 ENCOUNTER — Telehealth: Payer: Self-pay | Admitting: Family

## 2020-07-31 NOTE — Telephone Encounter (Signed)
Patient called to say that her shortness of breath was worse and that her oxygen level has gotten as low as 77% even with her oxygen on at 3L. She gets short of breath walking to her bathroom but she doesn't know how many feet that is. She also notes increased swelling in her abdomen.   Advised patient that it would be best for her to present to the ED for further evaluation. Patient refuses saying that her granddaughter is here and she has a [place] thawing in the refrigerator and she will have to wait until Friday to go to the ED (2 days from now). Emphasized that she should really go today but she says that she isn't going.   Since patient is adamant about not going to the ED, advised her to double her bumex to 2mg  BID (currently taking 1mg  BID) but that she also needed to double her potassium to 65meq BID (currently taking 37meq BID) as her last potassium level that I see back in September was low (2.8).  Again emphasized that she should go to the ED today. Patient verbalized understanding but says she will go in 2 days after Thanksgiving.

## 2020-08-02 DIAGNOSIS — J449 Chronic obstructive pulmonary disease, unspecified: Secondary | ICD-10-CM | POA: Diagnosis not present

## 2020-08-05 ENCOUNTER — Other Ambulatory Visit: Payer: Self-pay | Admitting: Hospice and Palliative Medicine

## 2020-08-05 DIAGNOSIS — Z1231 Encounter for screening mammogram for malignant neoplasm of breast: Secondary | ICD-10-CM | POA: Diagnosis not present

## 2020-08-05 DIAGNOSIS — F411 Generalized anxiety disorder: Secondary | ICD-10-CM

## 2020-08-05 MED ORDER — ALPRAZOLAM 0.25 MG PO TABS: 0.2500 mg | ORAL_TABLET | Freq: Two times a day (BID) | ORAL | 0 refills | Status: DC | PRN

## 2020-08-05 NOTE — Progress Notes (Signed)
Patient ID: [name], female    DOB: Aug 11, 1946, 73 y.o.   MRN: [id number]  HPI  [name] is a 74 y/o female with a history of hyperlipidemia, HTN, thyroid disease, COPD, left ear deafness, atrial fibrillation, previous tobacco use and chronic heart failure.   Echo report from 02/07/20 reviewed and showed an EF of >55% along with severe LAE. Catheterization done 02/07/20 but unable to view the results.   Admitted 05/11/20 due to shortness of breath. Initially given IV diuretics with transition to oral diuretics. Cardiology consult obtained. Inhaled steroids and nebulizer treatments done. Oxygen weaned from 5L down to baseline of 3L. Discharged after 3 days.   She presents today for a follow-up visit with a chief complaint of moderate fatigue upon minimal exertion. She describes this as chronic in nature having been present for several years. She has associated cough, shortness of breath, light-headedness, anxiety and joint pain along with this. She denies any difficulty sleeping, abdominal distention, palpitations, pedal edema, chest pain, wheezing or weight gain.    Last week when we talked, she increased her bumex to 2mg  BID along with increasing her potassium to 44meq BID. She has been taking it this way since 07/31/20 and says that her weight declined from 179 pounds to 174-175 pounds and she feels better.   Past Medical History:  Diagnosis Date  . 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 s/p bioprosthetic valve replacement 2008 at [facility];  b. 01/2015 Echo: EF 60-65%, no rwma, Gr1 DD, mildly dil LA, nl RV fxn.  . Atrial fibrillation with RVR ([place]) 01/11/2015  . Atrial flutter ([place])    a. 08/2016 s/p DCCV.  Remains on flecainide 50 mg bid.  . Basal skull fracture ([place]) 20 yrs ago  . CHF (congestive heart failure) ([place])   . Chronic respiratory failure ([place])   . COPD (chronic obstructive pulmonary disease) ([place])    a. on home O2 at 2L since 2008  . 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 Essential hypertension 01/24/2015  . History of cardiac cath    a. 2008 prior to Aortic aneurysm repair-->nl cors.  . History of stress test    a. 10/2015 MV: no ischemia/infarct.  [name] HLD (hyperlipidemia)   . HTN (hypertension)   . Hypothyroidism   . Obesity   . PAF (paroxysmal atrial fibrillation) ([name])    a. on Eliquis; b. CHADS2VASc = 3 (HTN, age x 1, female).  . Paroxysmal atrial fibrillation ([place]) 01/24/2015  . Right upper quadrant abdominal tenderness without rebound tenderness 03/02/2018  . S/P ascending aortic aneurysm repair 2008   Past Surgical History:  Procedure Laterality Date  . ABDOMINAL AORTIC ANEURYSM REPAIR  2008  . ABDOMINAL HYSTERECTOMY    . AORTIC VALVE REPLACEMENT  2008  . CARDIAC CATHETERIZATION     [facility]  . CARDIOVERSION N/A 08/15/2018   Procedure: CARDIOVERSION;  Surgeon: [name], MD;  Location: [facility] ORS;  Service: Cardiovascular;  Laterality: N/A;  . CARDIOVERSION N/A 11/14/2018   Procedure: CARDIOVERSION (CATH LAB);  Surgeon: [name], MD;  Location: [facility] ORS;  Service: Cardiovascular;  Laterality: N/A;  . CARDIOVERSION N/A 06/05/2019   Procedure: CARDIOVERSION;  Surgeon: [name], MD;  Location: [facility] ORS;  Service: Cardiovascular;  Laterality: N/A;  . CARDIOVERSION N/A 08/14/2019   Procedure: CARDIOVERSION;  Surgeon: [name], MD;  Location: [facility] ORS;  Service: Cardiovascular;  Laterality: N/A;  .  CARDIOVERSION N/A 11/09/2019   Procedure: CARDIOVERSION;  Surgeon: Isaias Cowman, MD;  Location: [facility] ORS;  Service: Cardiovascular;  Laterality: N/A;  . CARDIOVERSION N/A 01/17/2020   Procedure: CARDIOVERSION;  Surgeon: Isaias Cowman, MD;  Location: [facility] ORS;  Service: Cardiovascular;  Laterality: N/A;  . CARPAL TUNNEL RELEASE    . ELECTROPHYSIOLOGIC STUDY N/A 08/17/2016   Procedure: Cardioversion;  Surgeon: [name], MD;  Location: [facility] ORS;  Service: Cardiovascular;  Laterality: N/A;   . TUMOR EXCISION Left    x3 (arm)   Family History  Problem Relation Age of Onset  . Stroke Father   . Stroke Paternal Grandmother    Social History   Tobacco Use  . Smoking status: Former Smoker    Types: Cigarettes  . Smokeless tobacco: Never Used  Substance Use Topics  . Alcohol use: No   Allergies  Allergen Reactions  . Benadryl [Diphenhydramine Hcl (Sleep)] Palpitations  . Cetirizine Palpitations  . Lasix [Furosemide] Rash  . Levaquin [Levofloxacin In D5w] Other (See Comments)    Reaction:  Fatigue and muscle soreness  . Meloxicam Rash  . Soy Allergy Hives and Nausea And Vomiting  . Sulfa Antibiotics Rash   Prior to Admission medications   Medication Sig Start Date End Date Taking? Authorizing Provider  acetaminophen (TYLENOL) 500 MG tablet Take 500 mg by mouth every 4 (four) hours as needed for moderate pain or headache.    Yes [provider]  albuterol (PROVENTIL) (2.5 MG/3ML) 0.083% nebulizer solution Take 3 mLs (2.5 mg total) by nebulization every 6 (six) hours as needed for wheezing or shortness of breath. 04/15/20  Yes [place], Audie Clear, NP  ALPRAZolam Duanne Moron) 0.25 MG tablet Take 1 tablet (0.25 mg total) by mouth 2 (two) times daily as needed for anxiety. 08/06/20  Yes Luiz Ochoa, NP  atorvastatin (LIPITOR) 40 MG tablet 1 tab po qhs 03/14/20  Yes Boscia, Heather E, NP  bumetanide (BUMEX) 1 MG tablet Take 1 tablet (1 mg total) by mouth 2 (two) times daily. Patient taking differently: Take 2 mg by mouth 2 (two) times daily.  05/14/20 08/06/20 Yes Shahmehdi, Valeria Batman, MD  Calcium Carbonate-Vitamin D (CALCIUM 600+D PO) Take 1 tablet by mouth daily.   Yes [provider]  Coenzyme Q10 (COQ10) 200 MG CAPS Take 200 mg by mouth daily.   Yes [provider]  DULoxetine (CYMBALTA) 60 MG capsule Take 1 capsule (60 mg total) by mouth 2 (two) times daily. 03/25/20  Yes Boscia, Heather E, NP  ELIQUIS 5 MG TABS tablet Take 1 tablet by mouth twice  daily. Patient taking differently: Take 5 mg by mouth 2 (two) times daily.  10/26/19  Yes [name], MD  ferrous sulfate (FERROUSUL) 325 (65 FE) MG tablet Take 650 mg by mouth daily with breakfast.    Yes [provider]  flecainide (TAMBOCOR) 50 MG tablet Take 1 tablet (50 mg total) by mouth 2 (two) times daily. PLEASE CONTACT OFFICE [phone] TO SCHEDULE APPOINTMENT FOR FUTURE REFILLS 02/23/20  Yes Deboraha Sprang, MD  ipratropium-albuterol (DUONEB) 0.5-2.5 (3) MG/3ML SOLN Take 3 mLs by nebulization every 6 (six) hours as needed. 05/09/20  Yes Luiz Ochoa, NP  levothyroxine (SYNTHROID) 25 MCG tablet Take 1 tablet (25 mcg total) by mouth daily before breakfast. 07/15/20  Yes Boscia, Heather E, NP  lisinopril (ZESTRIL) 20 MG tablet Take 1 tablet (20 mg total) by mouth daily. 07/29/20 10/27/20 Yes Darylene Price A, FNP  pantoprazole (PROTONIX) 40 MG  tablet Take 1 tablet (40 mg total) by mouth daily. 07/15/20  Yes Boscia, Greer Ee, NP  potassium chloride (KLOR-CON) 10 MEQ tablet Take 2 tablets (20 mEq total) by mouth 2 (two) times daily. Patient taking differently: Take 40 mEq by mouth 2 (two) times daily.  12/15/19  Yes Ronnell Freshwater, NP  traZODone (DESYREL) 50 MG tablet Take 50 mg by mouth at bedtime.   Yes [provider]  Donnal Debar 100-62.5-25 MCG/INH AEPB INHALE 1 PUFF INTO THE LUNGS DAILY 05/08/20  Yes [name], MD  albuterol (VENTOLIN HFA) 108 (90 Base) MCG/ACT inhaler Inhale 2 puffs into the lungs every 6 (six) hours as needed for wheezing or shortness of breath. Patient not taking: Reported on 08/06/2020 04/15/20   Kendell Bane, NP    Review of Systems  Constitutional: Positive for fatigue (tire easily). Negative for appetite change.  HENT: Positive for hearing loss. Negative for congestion and sore throat.   Eyes: Negative.   Respiratory: Positive for cough and shortness of breath (minimal). Negative for wheezing.   Cardiovascular: Negative for  chest pain, palpitations and leg swelling.  Gastrointestinal: Negative for abdominal distention and abdominal pain.  Endocrine: Negative.   Genitourinary: Negative.   Musculoskeletal: Positive for arthralgias (bilateral shoulder pain). Negative for back pain.  Skin: Negative.   Allergic/Immunologic: Negative.   Neurological: Positive for light-headedness (at times). Negative for dizziness.  Hematological: Negative for adenopathy. Does not bruise/bleed easily.  Psychiatric/Behavioral: Negative for dysphoric mood and sleep disturbance (wearing cpap at bedtime). The patient is nervous/anxious.    Vitals:   08/06/20 1307  BP: 133/78  Pulse: 88  Resp: 16  SpO2: 91%  Weight: 177 lb (80.3 kg)  Height: 4\' 10"  (1.473 m)   Wt Readings from Last 3 Encounters:  08/06/20 177 lb (80.3 kg)  07/23/20 179 lb (81.2 kg)  07/11/20 179 lb 6.4 oz (81.4 kg)   Lab Results  Component Value Date   CREATININE 0.81 05/14/2020   CREATININE 0.82 05/13/2020   CREATININE 0.89 05/12/2020    Physical Exam Vitals and nursing note reviewed.  Constitutional:      Appearance: Normal appearance.  HENT:     Head: Normocephalic and atraumatic.     Left Ear: Decreased hearing noted.  Cardiovascular:     Rate and Rhythm: Normal rate and regular rhythm.  Pulmonary:     Effort: Pulmonary effort is normal. No respiratory distress.     Breath sounds: No wheezing or rales.  Abdominal:     General: There is no distension.     Palpations: Abdomen is soft.  Musculoskeletal:     Cervical back: Normal range of motion and neck supple.     Right lower leg: No edema.     Left lower leg: No edema.  Skin:    General: Skin is warm and dry.  Neurological:     General: No focal deficit present.     Mental Status: She is alert and oriented to person, place, and time.  Psychiatric:        Mood and Affect: Mood normal.        Behavior: Behavior normal.        Thought Content: Thought content normal.    Assessment &  Plan:  1: Chronic heart failure with preserved ejection fraction along with structural changes (LAE)- - NYHA class III - euvolemic today - weighing daily; reminded to call for an overnight weight gain of >2 pounds or a weekly weight gain  of >5 pounds - weight up 6 pounds from last visit here 2 months ago; she says that her home weight declined 4-5 pounds since doubling her diuretic - had doubled her bumex/potassium last week as patient refused to go to the ED since it was Thanksgiving - will check BMP today - not adding salt and is using pepper for seasoning; reviewed the importance of closely following a low sodium diet and written dietary information was given to her - saw cardiology Margarito Courser) 05/10/20 & returns 08/12/20 - has had previous valve surgery and returns to surgeon next month for routine follow-up - BNP 05/14/20 was 638.3 - reports receiving both covid vaccines  2: HTN- - BP looks good today - saw PCP Junius Creamer) 04/22/20 - BMP 05/14/20 reviewed and showed sodium 136, potassium 2.8, creatinine 0.81 and GFR >60  3: COPD- - wearing oxygen at 3L - saw pulmonology Humphrey Rolls) 07/23/20  4: Atrial fibrillation-  - pacemaker placed June 2021 - has had multiple cardioversions   Patient did not bring her medications nor a list. Each medication was verbally reviewed with the patient and she was encouraged to bring the bottles to every visit to confirm accuracy of list.  Return in 3 months or sooner for any questions/problems before then.

## 2020-08-06 ENCOUNTER — Encounter: Payer: Self-pay | Admitting: Family

## 2020-08-06 ENCOUNTER — Other Ambulatory Visit: Payer: Self-pay

## 2020-08-06 ENCOUNTER — Ambulatory Visit: Payer: Medicare Other | Attending: Family | Admitting: Family

## 2020-08-06 ENCOUNTER — Other Ambulatory Visit: Payer: Self-pay | Admitting: Hospice and Palliative Medicine

## 2020-08-06 VITALS — BP 133/78 | HR 88 | Resp 16 | Ht <= 58 in | Wt 177.0 lb

## 2020-08-06 DIAGNOSIS — Z888 Allergy status to other drugs, medicaments and biological substances status: Secondary | ICD-10-CM | POA: Diagnosis not present

## 2020-08-06 DIAGNOSIS — I110 Hypertensive heart disease with heart failure: Secondary | ICD-10-CM | POA: Insufficient documentation

## 2020-08-06 DIAGNOSIS — Z950 Presence of cardiac pacemaker: Secondary | ICD-10-CM | POA: Insufficient documentation

## 2020-08-06 DIAGNOSIS — J449 Chronic obstructive pulmonary disease, unspecified: Secondary | ICD-10-CM

## 2020-08-06 DIAGNOSIS — Z881 Allergy status to other antibiotic agents status: Secondary | ICD-10-CM | POA: Diagnosis not present

## 2020-08-06 DIAGNOSIS — F411 Generalized anxiety disorder: Secondary | ICD-10-CM

## 2020-08-06 DIAGNOSIS — Z952 Presence of prosthetic heart valve: Secondary | ICD-10-CM | POA: Diagnosis not present

## 2020-08-06 DIAGNOSIS — Z79899 Other long term (current) drug therapy: Secondary | ICD-10-CM | POA: Insufficient documentation

## 2020-08-06 DIAGNOSIS — Z87891 Personal history of nicotine dependence: Secondary | ICD-10-CM | POA: Diagnosis not present

## 2020-08-06 DIAGNOSIS — Z7901 Long term (current) use of anticoagulants: Secondary | ICD-10-CM | POA: Insufficient documentation

## 2020-08-06 DIAGNOSIS — I4891 Unspecified atrial fibrillation: Secondary | ICD-10-CM | POA: Insufficient documentation

## 2020-08-06 DIAGNOSIS — I480 Paroxysmal atrial fibrillation: Secondary | ICD-10-CM

## 2020-08-06 DIAGNOSIS — Z79890 Hormone replacement therapy (postmenopausal): Secondary | ICD-10-CM | POA: Diagnosis not present

## 2020-08-06 DIAGNOSIS — I10 Essential (primary) hypertension: Secondary | ICD-10-CM

## 2020-08-06 DIAGNOSIS — Z882 Allergy status to sulfonamides status: Secondary | ICD-10-CM | POA: Diagnosis not present

## 2020-08-06 DIAGNOSIS — I5032 Chronic diastolic (congestive) heart failure: Secondary | ICD-10-CM

## 2020-08-06 LAB — BASIC METABOLIC PANEL
Anion gap: 11 (ref 5–15)
BUN: 17 mg/dL (ref 8–23)
CO2: 27 mmol/L (ref 22–32)
Calcium: 9.3 mg/dL (ref 8.9–10.3)
Chloride: 99 mmol/L (ref 98–111)
Creatinine, Ser: 0.74 mg/dL (ref 0.44–1.00)
GFR, Estimated: >60 mL/min (ref >60)
Glucose, Bld: 98 mg/dL (ref 70–99)
Potassium: 3.7 mmol/L (ref 3.5–5.1)
Sodium: 137 mmol/L (ref 135–145)

## 2020-08-06 MED ORDER — ALPRAZOLAM 0.25 MG PO TABS: 0.2500 mg | ORAL_TABLET | Freq: Two times a day (BID) | ORAL | 0 refills | Status: DC | PRN

## 2020-08-06 NOTE — Patient Instructions (Signed)
Continue weighing daily and call for an overnight weight gain of > 2 pounds or a weekly weight gain of >5 pounds. 

## 2020-08-07 ENCOUNTER — Other Ambulatory Visit: Payer: Self-pay

## 2020-08-07 ENCOUNTER — Ambulatory Visit: Payer: Medicare Other

## 2020-08-07 DIAGNOSIS — G4733 Obstructive sleep apnea (adult) (pediatric): Secondary | ICD-10-CM | POA: Diagnosis not present

## 2020-08-07 NOTE — Progress Notes (Signed)
Could not read cpap machine will see what if I can make it read she will come back in Feb

## 2020-08-12 DIAGNOSIS — Q231 Congenital insufficiency of aortic valve: Secondary | ICD-10-CM | POA: Diagnosis not present

## 2020-08-12 DIAGNOSIS — I10 Essential (primary) hypertension: Secondary | ICD-10-CM | POA: Diagnosis not present

## 2020-08-12 DIAGNOSIS — I712 Thoracic aortic aneurysm, without rupture: Secondary | ICD-10-CM | POA: Diagnosis not present

## 2020-08-12 DIAGNOSIS — I4891 Unspecified atrial fibrillation: Secondary | ICD-10-CM | POA: Diagnosis not present

## 2020-08-15 ENCOUNTER — Other Ambulatory Visit: Payer: Self-pay

## 2020-08-15 MED ORDER — ATORVASTATIN CALCIUM 40 MG PO TABS: ORAL_TABLET | ORAL | 1 refills | Status: DC

## 2020-08-16 DIAGNOSIS — Z950 Presence of cardiac pacemaker: Secondary | ICD-10-CM | POA: Diagnosis not present

## 2020-08-20 ENCOUNTER — Other Ambulatory Visit: Payer: Self-pay | Admitting: Family

## 2020-08-20 ENCOUNTER — Other Ambulatory Visit: Payer: Self-pay

## 2020-08-20 MED ORDER — BUMETANIDE 1 MG PO TABS: 2.0000 mg | ORAL_TABLET | Freq: Two times a day (BID) | ORAL | 3 refills | Status: DC

## 2020-08-20 MED ORDER — MUPIROCIN CALCIUM 2 % NA OINT: TOPICAL_OINTMENT | NASAL | 0 refills | Status: DC

## 2020-08-20 MED ORDER — POTASSIUM CHLORIDE ER 10 MEQ PO TBCR
40.0000 meq | EXTENDED_RELEASE_TABLET | Freq: Two times a day (BID) | ORAL | 3 refills | Status: DC
Start: 2020-08-20 — End: 2020-11-28

## 2020-08-21 ENCOUNTER — Other Ambulatory Visit: Payer: Self-pay

## 2020-08-21 DIAGNOSIS — J449 Chronic obstructive pulmonary disease, unspecified: Secondary | ICD-10-CM

## 2020-08-21 MED ORDER — TRELEGY ELLIPTA 100-62.5-25 MCG/INH IN AEPB: 1.0000 | INHALATION_SPRAY | Freq: Every day | RESPIRATORY_TRACT | 3 refills | Status: DC

## 2020-08-23 ENCOUNTER — Telehealth: Payer: Self-pay | Admitting: Family

## 2020-08-23 NOTE — Telephone Encounter (Signed)
LVM for patient regarding the continued problems with her getting her bumex/ potassium prescriptions this week. [place] and pharmacist said that yesterday they did not have enough quantity for her potassium RX but they did give her some yesterday and they would have the rest of the potassium for her to pick up today.   They said that, somehow, the bumex RX I called in got sent to [facility] delivery so her insurance would not approve her getting the 90 day supply since it was already approved through [facility] delivery. Pharmacist did not know how it got to [facility] delivery. They were able to get an override from her insurance company but said they could only get the override for 30 day supply but not the 90 day supply. Pharmacist said that patient can bring that 30 day bottle back to [facility] and get it refilled again but she can only get a 30 day supply at a time for right now.

## 2020-08-24 DIAGNOSIS — Z950 Presence of cardiac pacemaker: Secondary | ICD-10-CM | POA: Diagnosis not present

## 2020-08-26 ENCOUNTER — Other Ambulatory Visit: Payer: Self-pay

## 2020-08-26 MED ORDER — TRAZODONE HCL 50 MG PO TABS: 50.0000 mg | ORAL_TABLET | Freq: Every day | ORAL | 0 refills | Status: DC

## 2020-08-28 ENCOUNTER — Telehealth: Payer: Self-pay

## 2020-08-28 ENCOUNTER — Ambulatory Visit: Payer: Medicare Other | Admitting: Hospice and Palliative Medicine

## 2020-08-28 ENCOUNTER — Encounter: Payer: Self-pay | Admitting: Hospice and Palliative Medicine

## 2020-08-28 VITALS — BP 126/70 | HR 85 | Temp 97.8°F | Resp 16 | Ht <= 58 in | Wt 181.4 lb

## 2020-08-28 DIAGNOSIS — M25512 Pain in left shoulder: Secondary | ICD-10-CM

## 2020-08-28 DIAGNOSIS — Z9981 Dependence on supplemental oxygen: Secondary | ICD-10-CM | POA: Diagnosis not present

## 2020-08-28 DIAGNOSIS — J9611 Chronic respiratory failure with hypoxia: Secondary | ICD-10-CM | POA: Diagnosis not present

## 2020-08-28 DIAGNOSIS — Z7409 Other reduced mobility: Secondary | ICD-10-CM

## 2020-08-28 DIAGNOSIS — G8929 Other chronic pain: Secondary | ICD-10-CM

## 2020-08-28 DIAGNOSIS — I5032 Chronic diastolic (congestive) heart failure: Secondary | ICD-10-CM | POA: Diagnosis not present

## 2020-08-28 DIAGNOSIS — R0602 Shortness of breath: Secondary | ICD-10-CM

## 2020-08-28 MED ORDER — TRAMADOL HCL 50 MG PO TABS: 50.0000 mg | ORAL_TABLET | Freq: Two times a day (BID) | ORAL | 0 refills | Status: AC | PRN

## 2020-08-28 NOTE — Telephone Encounter (Signed)
Spoke with [facility] that we put order in epic for nursing and PT

## 2020-08-28 NOTE — Progress Notes (Signed)
[facility] [place],  [id number]  Internal MEDICINE  Office Visit Note  Patient Name: [name]  [id number]  [id number]  Date of Service: 09/06/2020  Chief Complaint  Patient presents with  . Acute Visit    Right nasal congestion, sore around base of nose, nose bleeds, left shoulder has deterioration and sometimes pt will get a sharp pain in left arm and she wont be able to move it, it prevents her from falling asleep because she cant sleep on her back but its uncomfortable to sleep on her sides  . Hyperlipidemia  . Hypertension  . COPD     HPI Pt is here for a sick visit. She is complaining of nasal congestion and skin dryness under nasal cannula at bottom of her nose She is also becoming overwhelmed with independently managing her multiple chronic health conditions Was given Mupirocin ointment for possible MRSA in nasal passages--no longer complaining of sores inside of nose but now has dryness under nose  Also complaining today of left shoulder pain, this is chronic pain that has recently increased in pain, it disrupts her sleep as she cannot get comfortable, denies recent injury  Requesting home health   Current Medication:  Outpatient Encounter Medications as of 08/28/2020  Medication Sig  . acetaminophen (TYLENOL) 500 MG tablet Take 500 mg by mouth every 4 (four) hours as needed for moderate pain or headache.   . albuterol (PROVENTIL) (2.5 MG/3ML) 0.083% nebulizer solution Take 3 mLs (2.5 mg total) by nebulization every 6 (six) hours as needed for wheezing or shortness of breath.  [name] albuterol (VENTOLIN HFA) 108 (90 Base) MCG/ACT inhaler Inhale 2 puffs into the lungs every 6 (six) hours as needed for wheezing or shortness of breath. (Patient not taking: Reported on 08/06/2020)  . ALPRAZolam (XANAX) 0.25 MG tablet Take 1 tablet (0.25 mg total) by mouth 2 (two) times daily as needed for anxiety.  [name] atorvastatin (LIPITOR) 40 MG tablet 1  tab po qhs  . bumetanide (BUMEX) 1 MG tablet Take 2 tablets (2 mg total) by mouth 2 (two) times daily.  . Calcium Carbonate-Vitamin D (CALCIUM 600+D PO) Take 1 tablet by mouth daily.  . Coenzyme Q10 (COQ10) 200 MG CAPS Take 200 mg by mouth daily.  . DULoxetine (CYMBALTA) 60 MG capsule Take 1 capsule (60 mg total) by mouth 2 (two) times daily.  [name] ELIQUIS 5 MG TABS tablet Take 1 tablet by mouth twice daily. (Patient taking differently: Take 5 mg by mouth 2 (two) times daily. )  . ferrous sulfate (FERROUSUL) 325 (65 FE) MG tablet Take 650 mg by mouth daily with breakfast.   . flecainide (TAMBOCOR) 50 MG tablet Take 1 tablet (50 mg total) by mouth 2 (two) times daily. PLEASE CONTACT OFFICE [phone] TO SCHEDULE APPOINTMENT FOR FUTURE REFILLS  . Fluticasone-Umeclidin-Vilant (TRELEGY ELLIPTA) 100-62.5-25 MCG/INH AEPB Inhale 1 puff into the lungs daily.  [name] ipratropium-albuterol (DUONEB) 0.5-2.5 (3) MG/3ML SOLN Take 3 mLs by nebulization every 6 (six) hours as needed.  [name] levothyroxine (SYNTHROID) 25 MCG tablet Take 1 tablet (25 mcg total) by mouth daily before breakfast.  . lisinopril (ZESTRIL) 20 MG tablet Take 1 tablet (20 mg total) by mouth daily.  . mupirocin nasal ointment (BACTROBAN) 2 % Use one-half of tube in each nostril twice daily for five (5) days. After application, press sides of nose together and gently massage.  . pantoprazole (PROTONIX) 40 MG tablet Take 1 tablet (40 mg total) by mouth daily.  [name]  potassium chloride (KLOR-CON) 10 MEQ tablet Take 4 tablets (40 mEq total) by mouth 2 (two) times daily.  . [EXPIRED] traMADol (ULTRAM) 50 MG tablet Take 1 tablet (50 mg total) by mouth 2 (two) times daily as needed for up to 5 days.  . traZODone (DESYREL) 50 MG tablet Take 1 tablet (50 mg total) by mouth at bedtime.   No facility-administered encounter medications on file as of 08/28/2020.      Medical History: Past Medical History:  Diagnosis Date  . Aortic stenosis due to bicuspid  aortic valve    a. s/p bioprosthetic valve replacement 2008 at [facility];  b. 01/2015 Echo: EF 60-65%, no rwma, Gr1 DD, mildly dil LA, nl RV fxn.  . Atrial fibrillation with RVR ([place]) 01/11/2015  . Atrial flutter ([place])    a. 08/2016 s/p DCCV.  Remains on flecainide 50 mg bid.  . Basal skull fracture ([place]) 20 yrs ago  . CHF (congestive heart failure) ([place])   . Chronic respiratory failure ([place])   . COPD (chronic obstructive pulmonary disease) ([place])    a. on home O2 at 2L since 2008  . 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 Essential hypertension 01/24/2015  . History of cardiac cath    a. 2008 prior to Aortic aneurysm repair-->nl cors.  . History of stress test    a. 10/2015 MV: no ischemia/infarct.  [name] HLD (hyperlipidemia)   . HTN (hypertension)   . Hypothyroidism   . Obesity   . PAF (paroxysmal atrial fibrillation) ([name])    a. on Eliquis; b. CHADS2VASc = 3 (HTN, age x 1, female).  . Paroxysmal atrial fibrillation ([place]) 01/24/2015  . Right upper quadrant abdominal tenderness without rebound tenderness 03/02/2018  . S/P ascending aortic aneurysm repair 2008     Vital Signs: BP 126/70   Pulse 85   Temp 97.8 F (36.6 C)   Resp 16   Ht 4\' 10"  (1.473 m)   Wt 181 lb 6.4 oz (82.3 kg)   SpO2 95%   BMI 37.91 kg/m    Review of Systems  Constitutional: Negative for chills, diaphoresis and fatigue.  HENT: Negative for ear pain, postnasal drip and sinus pressure.        Pain and dryness under nose  Eyes: Negative for photophobia, discharge, redness, itching and visual disturbance.  Respiratory: Positive for shortness of breath and wheezing. Negative for cough.   Cardiovascular: Negative for chest pain, palpitations and leg swelling.  Gastrointestinal: Negative for abdominal pain, constipation, diarrhea, nausea and vomiting.  Genitourinary: Negative for dysuria and flank pain.  Musculoskeletal: Positive for arthralgias. Negative for back pain, gait problem and neck pain.        Left shoulder pain  Skin: Negative for color change.  Allergic/Immunologic: Negative for environmental allergies and food allergies.  Neurological: Negative for dizziness and headaches.  Hematological: Does not bruise/bleed easily.  Psychiatric/Behavioral: Negative for agitation, behavioral problems (depression) and hallucinations.    Physical Exam Vitals reviewed.  Constitutional:      Appearance: Normal appearance. She is obese.  HENT:     Nose:     Comments: Skin dryness under nasal cannula at bottom of nose Cardiovascular:     Rate and Rhythm: Normal rate and regular rhythm.     Pulses: Normal pulses.     Heart sounds: Normal heart sounds.  Pulmonary:     Effort: Pulmonary effort is normal.     Breath sounds: Normal breath  sounds.  Musculoskeletal:     Left shoulder: Decreased range of motion.  Skin:    General: Skin is warm.  Neurological:     General: No focal deficit present.     Mental Status: She is alert and oriented to person, place, and time. Mental status is at baseline.  Psychiatric:        Mood and Affect: Mood normal.        Behavior: Behavior normal.        Thought Content: Thought content normal.        Judgment: Judgment normal.    Assessment/Plan: 1. Chronic respiratory failure with hypoxia ([place]) Continue with supplemental therapies at this time Minimal reserve, requires frequent rest breaks during exertion due to hypoxia and dyspnea - Ambulatory referral to [place]  2. Oxygen dependent Continue with 2LPM supplemental oxygen at all times during the day Oxygen accessories increase risk of falls due to multiple other chronic comorbidities - Ambulatory referral to [place]  3. Chronic left shoulder pain May use tramadol as needed for severe pain in left shoulder, awaiting to see orhto - traMADol (ULTRAM) 50 MG tablet; Take 1 tablet (50 mg total) by mouth 2 (two) times daily as needed for up to 5 days.  Dispense: 45 tablet; Refill:  0  4. Chronic diastolic heart failure ([name]) Continue to be followed and managed by cardiology, further impairs breathing and dyspnea with exertion - Ambulatory referral to [place]  5. Impaired mobility Currently using walker for ambulation assistance, again noted minimal reserve in respiratory function--high fall risk - Ambulatory referral to [place]  6. Shortness of breath - Ambulatory referral to Home Health  General Counseling: Zahriyah verbalizes understanding of the findings of todays visit and agrees with plan of treatment. I have discussed any further diagnostic evaluation that may be needed or ordered today. We also reviewed her medications today. she has been encouraged to call the office with any questions or concerns that should arise related to todays visit.   Orders Placed This Encounter  Procedures  . Ambulatory referral to [place] ordered this encounter  Medications  . traMADol (ULTRAM) 50 MG tablet    Sig: Take 1 tablet (50 mg total) by mouth 2 (two) times daily as needed for up to 5 days.    Dispense:  45 tablet    Refill:  0    Time spent: 30 Minutes Time spent includes review of chart, medications, test results and follow-up plan with the patient.  This patient was seen by Theodoro Grist AGNP-C in Collaboration with Dr [name] as a part of collaborative care agreement.  Tanna Furry [facility] Internal Medicine

## 2020-09-01 DIAGNOSIS — J449 Chronic obstructive pulmonary disease, unspecified: Secondary | ICD-10-CM | POA: Diagnosis not present

## 2020-09-04 DIAGNOSIS — Z953 Presence of xenogenic heart valve: Secondary | ICD-10-CM | POA: Diagnosis not present

## 2020-09-04 DIAGNOSIS — Z952 Presence of prosthetic heart valve: Secondary | ICD-10-CM | POA: Diagnosis not present

## 2020-09-04 DIAGNOSIS — R911 Solitary pulmonary nodule: Secondary | ICD-10-CM | POA: Diagnosis not present

## 2020-09-04 DIAGNOSIS — Z8774 Personal history of (corrected) congenital malformations of heart and circulatory system: Secondary | ICD-10-CM | POA: Diagnosis not present

## 2020-09-04 DIAGNOSIS — R590 Localized enlarged lymph nodes: Secondary | ICD-10-CM | POA: Diagnosis not present

## 2020-09-04 DIAGNOSIS — R918 Other nonspecific abnormal finding of lung field: Secondary | ICD-10-CM | POA: Diagnosis not present

## 2020-09-04 DIAGNOSIS — Z79899 Other long term (current) drug therapy: Secondary | ICD-10-CM | POA: Diagnosis not present

## 2020-09-04 DIAGNOSIS — Z87891 Personal history of nicotine dependence: Secondary | ICD-10-CM | POA: Diagnosis not present

## 2020-09-04 DIAGNOSIS — I712 Thoracic aortic aneurysm, without rupture: Secondary | ICD-10-CM | POA: Diagnosis not present

## 2020-09-04 DIAGNOSIS — J90 Pleural effusion, not elsewhere classified: Secondary | ICD-10-CM | POA: Diagnosis not present

## 2020-09-04 DIAGNOSIS — Z8679 Personal history of other diseases of the circulatory system: Secondary | ICD-10-CM | POA: Diagnosis not present

## 2020-09-04 DIAGNOSIS — Q231 Congenital insufficiency of aortic valve: Secondary | ICD-10-CM | POA: Diagnosis not present

## 2020-09-04 DIAGNOSIS — Z48812 Encounter for surgical aftercare following surgery on the circulatory system: Secondary | ICD-10-CM | POA: Diagnosis not present

## 2020-09-06 ENCOUNTER — Encounter: Payer: Self-pay | Admitting: Hospice and Palliative Medicine

## 2020-09-11 ENCOUNTER — Telehealth: Payer: Self-pay

## 2020-09-11 NOTE — Telephone Encounter (Signed)
lmom to pt call us back

## 2020-09-12 ENCOUNTER — Other Ambulatory Visit: Payer: Self-pay | Admitting: Family

## 2020-09-12 MED ORDER — LISINOPRIL 40 MG PO TABS: 20.0000 mg | ORAL_TABLET | Freq: Every day | ORAL | 3 refills | Status: DC

## 2020-09-12 NOTE — Progress Notes (Signed)
Lisinopril increased by [facility] cardiology. Dose increase noted

## 2020-09-16 DIAGNOSIS — G4733 Obstructive sleep apnea (adult) (pediatric): Secondary | ICD-10-CM | POA: Diagnosis not present

## 2020-09-26 ENCOUNTER — Ambulatory Visit: Payer: Medicare Other | Admitting: Internal Medicine

## 2020-09-26 ENCOUNTER — Other Ambulatory Visit: Payer: Self-pay

## 2020-09-26 DIAGNOSIS — F321 Major depressive disorder, single episode, moderate: Secondary | ICD-10-CM

## 2020-09-26 MED ORDER — DULOXETINE HCL 60 MG PO CPEP: 60.0000 mg | ORAL_CAPSULE | Freq: Two times a day (BID) | ORAL | 1 refills | Status: DC

## 2020-10-02 DIAGNOSIS — J449 Chronic obstructive pulmonary disease, unspecified: Secondary | ICD-10-CM | POA: Diagnosis not present

## 2020-10-03 ENCOUNTER — Encounter: Payer: Self-pay | Admitting: Hospice and Palliative Medicine

## 2020-10-03 ENCOUNTER — Ambulatory Visit (INDEPENDENT_AMBULATORY_CARE_PROVIDER_SITE_OTHER): Payer: Medicare Other | Admitting: Hospice and Palliative Medicine

## 2020-10-03 VITALS — BP 140/66 | HR 88 | Temp 97.3°F | Resp 16 | Ht <= 58 in | Wt 182.0 lb

## 2020-10-03 DIAGNOSIS — I480 Paroxysmal atrial fibrillation: Secondary | ICD-10-CM

## 2020-10-03 DIAGNOSIS — M25511 Pain in right shoulder: Secondary | ICD-10-CM

## 2020-10-03 DIAGNOSIS — J9611 Chronic respiratory failure with hypoxia: Secondary | ICD-10-CM

## 2020-10-03 DIAGNOSIS — F411 Generalized anxiety disorder: Secondary | ICD-10-CM

## 2020-10-03 DIAGNOSIS — Q231 Congenital insufficiency of aortic valve: Secondary | ICD-10-CM

## 2020-10-03 DIAGNOSIS — G8929 Other chronic pain: Secondary | ICD-10-CM

## 2020-10-03 MED ORDER — ALPRAZOLAM 0.25 MG PO TABS: 0.2500 mg | ORAL_TABLET | Freq: Two times a day (BID) | ORAL | 0 refills | Status: DC | PRN

## 2020-10-03 NOTE — Progress Notes (Signed)
[facility] [place], [place] [address]  Internal MEDICINE  Office Visit Note  Patient Name: [name]  [id number]  [id number]  Date of Service: 10/05/2020  Chief Complaint  Patient presents with  . Hypertension  . Hyperlipidemia    HPI Patient is here for routine follow-up -C/o of chronic right shoulder pain, scheduled for steroid injection tomorrow with ortho, tramadol given at last visit helped relieve her pain--history of arthritis -Followed by cardiology for bicuspid aortic valve with bioprosthetic replacement 2008--lisinopril recently increased to 40 mg daily--also history of PAF -History of OSA--wears CPAP nightly -Continues to wear 3LPM supplemental oxygen during the day for COPD with hypoxia  Anxiety and depression remain stable, will have anxious episodes that cause her to feel short of breath which is relieved with as needed alprazolam Family issues also contribute to her anxiety and depression--struggles with a relationship with her daughter and grandchildren Sleeps well at night, occasionally does take Trazodone to help with sleep No recent changes in his appetite   Current Medication: Outpatient Encounter Medications as of 10/03/2020  Medication Sig  . acetaminophen (TYLENOL) 500 MG tablet Take 500 mg by mouth every 4 (four) hours as needed for moderate pain or headache.   . albuterol (PROVENTIL) (2.5 MG/3ML) 0.083% nebulizer solution Take 3 mLs (2.5 mg total) by nebulization every 6 (six) hours as needed for wheezing or shortness of breath.  [name] albuterol (VENTOLIN HFA) 108 (90 Base) MCG/ACT inhaler Inhale 2 puffs into the lungs every 6 (six) hours as needed for wheezing or shortness of breath.  [name] atorvastatin (LIPITOR) 40 MG tablet 1 tab po qhs  . Calcium Carbonate-Vitamin D (CALCIUM 600+D PO) Take 1 tablet by mouth daily.  . Coenzyme Q10 (COQ10) 200 MG CAPS Take 200 mg by mouth daily.  . DULoxetine (CYMBALTA) 60 MG capsule Take 1 capsule  (60 mg total) by mouth 2 (two) times daily.  [name] ELIQUIS 5 MG TABS tablet Take 1 tablet by mouth twice daily. (Patient taking differently: Take 5 mg by mouth 2 (two) times daily.)  . ferrous sulfate 325 (65 FE) MG tablet Take 650 mg by mouth daily with breakfast.   . flecainide (TAMBOCOR) 50 MG tablet Take 1 tablet (50 mg total) by mouth 2 (two) times daily. PLEASE CONTACT OFFICE [phone] TO SCHEDULE APPOINTMENT FOR FUTURE REFILLS  . Fluticasone-Umeclidin-Vilant (TRELEGY ELLIPTA) 100-62.5-25 MCG/INH AEPB Inhale 1 puff into the lungs daily.  [name] ipratropium-albuterol (DUONEB) 0.5-2.5 (3) MG/3ML SOLN Take 3 mLs by nebulization every 6 (six) hours as needed.  [name] levothyroxine (SYNTHROID) 25 MCG tablet Take 1 tablet (25 mcg total) by mouth daily before breakfast.  . lisinopril (ZESTRIL) 40 MG tablet Take 0.5 tablets (20 mg total) by mouth daily.  . mupirocin nasal ointment (BACTROBAN) 2 % Use one-half of tube in each nostril twice daily for five (5) days. After application, press sides of nose together and gently massage.  . pantoprazole (PROTONIX) 40 MG tablet Take 1 tablet (40 mg total) by mouth daily.  . potassium chloride (KLOR-CON) 10 MEQ tablet Take 4 tablets (40 mEq total) by mouth 2 (two) times daily.  . traZODone (DESYREL) 50 MG tablet Take 1 tablet (50 mg total) by mouth at bedtime.  . [DISCONTINUED] ALPRAZolam (XANAX) 0.25 MG tablet Take 1 tablet (0.25 mg total) by mouth 2 (two) times daily as needed for anxiety.  . ALPRAZolam (XANAX) 0.25 MG tablet Take 1 tablet (0.25 mg total) by mouth 2 (two) times daily as needed  for anxiety.  . bumetanide (BUMEX) 1 MG tablet Take 2 tablets (2 mg total) by mouth 2 (two) times daily.   No facility-administered encounter medications on file as of 10/03/2020.    Surgical History: Past Surgical History:  Procedure Laterality Date  . ABDOMINAL AORTIC ANEURYSM REPAIR  2008  . ABDOMINAL HYSTERECTOMY    . AORTIC VALVE REPLACEMENT  2008  . CARDIAC  CATHETERIZATION     [facility]  . CARDIOVERSION N/A 08/15/2018   Procedure: CARDIOVERSION;  Surgeon: [name], MD;  Location: [facility] ORS;  Service: Cardiovascular;  Laterality: N/A;  . CARDIOVERSION N/A 11/14/2018   Procedure: CARDIOVERSION (CATH LAB);  Surgeon: [name], MD;  Location: [facility] ORS;  Service: Cardiovascular;  Laterality: N/A;  . CARDIOVERSION N/A 06/05/2019   Procedure: CARDIOVERSION;  Surgeon: [name], MD;  Location: [facility] ORS;  Service: Cardiovascular;  Laterality: N/A;  . CARDIOVERSION N/A 08/14/2019   Procedure: CARDIOVERSION;  Surgeon: [name], MD;  Location: [facility] ORS;  Service: Cardiovascular;  Laterality: N/A;  . CARDIOVERSION N/A 11/09/2019   Procedure: CARDIOVERSION;  Surgeon: Isaias Cowman, MD;  Location: [facility] ORS;  Service: Cardiovascular;  Laterality: N/A;  . CARDIOVERSION N/A 01/17/2020   Procedure: CARDIOVERSION;  Surgeon: Isaias Cowman, MD;  Location: [facility] ORS;  Service: Cardiovascular;  Laterality: N/A;  . CARPAL TUNNEL RELEASE    . ELECTROPHYSIOLOGIC STUDY N/A 08/17/2016   Procedure: Cardioversion;  Surgeon: [name], MD;  Location: [facility] ORS;  Service: Cardiovascular;  Laterality: N/A;  . TUMOR EXCISION Left    x3 (arm)    Medical History: Past Medical History:  Diagnosis Date  . 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 s/p bioprosthetic valve replacement 2008 at [facility];  b. 01/2015 Echo: EF 60-65%, no rwma, Gr1 DD, mildly dil LA, nl RV fxn.  . Atrial fibrillation with RVR ([place]) 01/11/2015  . Atrial flutter ([place])    a. 08/2016 s/p DCCV.  Remains on flecainide 50 mg bid.  . Basal skull fracture ([place]) 20 yrs ago  . CHF (congestive heart failure) ([place])   . Chronic respiratory failure ([place])   . COPD (chronic obstructive pulmonary disease) ([place])    a. on home O2 at 2L since 2008  . 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 Essential hypertension 01/24/2015  . History of cardiac cath    a.  2008 prior to Aortic aneurysm repair-->nl cors.  . History of stress test    a. 10/2015 MV: no ischemia/infarct.  [name] HLD (hyperlipidemia)   . HTN (hypertension)   . Hypothyroidism   . Obesity   . PAF (paroxysmal atrial fibrillation) ([name])    a. on Eliquis; b. CHADS2VASc = 3 (HTN, age x 1, female).  . Paroxysmal atrial fibrillation ([place]) 01/24/2015  . Right upper quadrant abdominal tenderness without rebound tenderness 03/02/2018  . S/P ascending aortic aneurysm repair 2008    Family History: Family History  Problem Relation Age of Onset  . Stroke Father   . Stroke Paternal Grandmother     Social History   Socioeconomic History  . Marital status: Single    Spouse name: Not on file  . Number of children: Not on file  . Years of education: Not on file  . Highest education level: Not on file  Occupational History  . Not on file  Tobacco Use  . Smoking status: Former Smoker    Types: Cigarettes  . Smokeless tobacco:  Never Used  Vaping Use  . Vaping Use: Never used  Substance and Sexual Activity  . Alcohol use: No  . Drug use: No  . Sexual activity: Never    Birth control/protection: Surgical  Other Topics Concern  . Not on file  Social History Narrative  . Not on file   Social Determinants of Health   Financial Resource Strain: Not on file  Food Insecurity: Not on file  Transportation Needs: Not on file  Physical Activity: Not on file  Stress: Not on file  Social Connections: Not on file  Intimate Partner Violence: Not on file      Review of Systems  Constitutional: Negative for chills, diaphoresis and fatigue.  HENT: Negative for ear pain, postnasal drip and sinus pressure.   Eyes: Negative for photophobia, discharge, redness, itching and visual disturbance.  Respiratory: Positive for shortness of breath. Negative for cough and wheezing.   Cardiovascular: Negative for chest pain, palpitations and leg swelling.  Gastrointestinal: Negative for abdominal pain,  constipation, diarrhea, nausea and vomiting.  Genitourinary: Negative for dysuria and flank pain.  Musculoskeletal: Positive for arthralgias. Negative for back pain, gait problem and neck pain.       Right shoulder pain  Skin: Negative for color change.  Allergic/Immunologic: Negative for environmental allergies and food allergies.  Neurological: Negative for dizziness and headaches.  Hematological: Does not bruise/bleed easily.  Psychiatric/Behavioral: Negative for agitation, behavioral problems (depression) and hallucinations. The patient is nervous/anxious.     Vital Signs: BP 140/66   Pulse 88   Temp (!) 97.3 F (36.3 C)   Resp 16   Ht 4\' 10"  (1.473 m)   Wt 182 lb (82.6 kg)   SpO2 95% Comment: 3 litre  BMI 38.04 kg/m    Physical Exam Vitals reviewed.  Constitutional:      Appearance: Normal appearance. She is obese.  Cardiovascular:     Rate and Rhythm: Normal rate and regular rhythm.     Pulses: Normal pulses.     Heart sounds: Normal heart sounds.  Pulmonary:     Effort: Pulmonary effort is normal.     Breath sounds: Examination of the right-lower field reveals decreased breath sounds. Examination of the left-lower field reveals decreased breath sounds. Decreased breath sounds present.  Abdominal:     General: Abdomen is flat.     Palpations: Abdomen is soft.  Musculoskeletal:     Right shoulder: Decreased range of motion.     Cervical back: Normal range of motion.  Skin:    General: Skin is warm.  Neurological:     General: No focal deficit present.     Mental Status: She is alert and oriented to person, place, and time. Mental status is at baseline.  Psychiatric:        Mood and Affect: Mood normal.        Behavior: Behavior normal.        Thought Content: Thought content normal.        Judgment: Judgment normal.    Assessment/Plan: 1. Chronic right shoulder pain Follow-up with ortho, scheduled for cortisone injection tomorrow Continue Tramadol as  needed for severe pain  2. GAD (generalized anxiety disorder) Continue with Cymbalta as well as alprazolam as needed - ALPRAZolam (XANAX) 0.25 MG tablet; Take 1 tablet (0.25 mg total) by mouth 2 (two) times daily as needed for anxiety.  Dispense: 60 tablet; Refill: 0  3. Chronic respiratory failure with hypoxia ([name]) Continue supplemental therapies Use of alprazolam as  needed for anxiety triggered by shortness of breath - ALPRAZolam (XANAX) 0.25 MG tablet; Take 1 tablet (0.25 mg total) by mouth 2 (two) times daily as needed for anxiety.  Dispense: 60 tablet; Refill: 0  4. Bicuspid aortic valve S/p valve replacement in 2008, continue with close follow-up with cardiology  5. PAF (paroxysmal atrial fibrillation) ([place]) S/p 5 cardioversion's, most recent May 2021 with pacemaker implantation June 2021, rhythm remain NSR today, continue with supportive therapies and follow-up with cardiology   General Counseling: Mackayla verbalizes understanding of the findings of todays visit and agrees with plan of treatment. I have discussed any further diagnostic evaluation that may be needed or ordered today. We also reviewed her medications today. she has been encouraged to call the office with any questions or concerns that should arise related to todays visit.   Meds ordered this encounter  Medications  . ALPRAZolam (XANAX) 0.25 MG tablet    Sig: Take 1 tablet (0.25 mg total) by mouth 2 (two) times daily as needed for anxiety.    Dispense:  60 tablet    Refill:  0    Time spent: 30 Minutes Time spent includes review of chart, medications, test results and follow-up plan with the patient.  This patient was seen by Theodoro Grist AGNP-C in Collaboration with Dr [name] as a part of collaborative care agreement     Tanna Furry. Ambika Zettlemoyer AGNP-C Internal medicine

## 2020-10-04 DIAGNOSIS — M19011 Primary osteoarthritis, right shoulder: Secondary | ICD-10-CM | POA: Diagnosis not present

## 2020-10-04 DIAGNOSIS — M19012 Primary osteoarthritis, left shoulder: Secondary | ICD-10-CM | POA: Diagnosis not present

## 2020-10-04 DIAGNOSIS — I712 Thoracic aortic aneurysm, without rupture: Secondary | ICD-10-CM | POA: Diagnosis not present

## 2020-10-05 ENCOUNTER — Encounter: Payer: Self-pay | Admitting: Hospice and Palliative Medicine

## 2020-10-07 ENCOUNTER — Other Ambulatory Visit: Payer: Self-pay | Admitting: Family

## 2020-10-07 MED ORDER — LISINOPRIL 40 MG PO TABS
40.0000 mg | ORAL_TABLET | Freq: Every day | ORAL | 3 refills | Status: DC
Start: 2020-10-07 — End: 2021-09-29

## 2020-10-08 ENCOUNTER — Other Ambulatory Visit: Payer: Self-pay | Admitting: Internal Medicine

## 2020-10-08 DIAGNOSIS — J449 Chronic obstructive pulmonary disease, unspecified: Secondary | ICD-10-CM

## 2020-10-09 ENCOUNTER — Ambulatory Visit: Payer: Medicare Other

## 2020-10-09 DIAGNOSIS — G4733 Obstructive sleep apnea (adult) (pediatric): Secondary | ICD-10-CM | POA: Diagnosis not present

## 2020-10-09 NOTE — Progress Notes (Signed)
95 percentile pressure 8   95th percentile leak 10.5   apnea index 0.3 /hr  apnea-hypopnea index  0.4 /hr   total days used  >4 hr 30 days  total days used <4 hr 0 days  Total compliance 100 percent  She is doing great on cpap no problems or questions at this time.

## 2020-10-11 ENCOUNTER — Ambulatory Visit: Payer: Medicare Other | Admitting: Hospice and Palliative Medicine

## 2020-10-22 ENCOUNTER — Ambulatory Visit: Payer: Medicare Other | Admitting: Hospice and Palliative Medicine

## 2020-10-31 ENCOUNTER — Encounter: Payer: Self-pay | Admitting: Internal Medicine

## 2020-10-31 ENCOUNTER — Other Ambulatory Visit: Payer: Self-pay | Admitting: Internal Medicine

## 2020-10-31 ENCOUNTER — Ambulatory Visit: Payer: Medicare Other | Admitting: Internal Medicine

## 2020-10-31 VITALS — BP 120/70 | HR 90 | Temp 97.8°F | Resp 16 | Ht <= 58 in | Wt 178.0 lb

## 2020-10-31 DIAGNOSIS — F321 Major depressive disorder, single episode, moderate: Secondary | ICD-10-CM | POA: Diagnosis not present

## 2020-10-31 DIAGNOSIS — Z950 Presence of cardiac pacemaker: Secondary | ICD-10-CM | POA: Diagnosis not present

## 2020-10-31 DIAGNOSIS — R0600 Dyspnea, unspecified: Secondary | ICD-10-CM | POA: Diagnosis not present

## 2020-10-31 DIAGNOSIS — G252 Other specified forms of tremor: Secondary | ICD-10-CM | POA: Diagnosis not present

## 2020-10-31 DIAGNOSIS — R0602 Shortness of breath: Secondary | ICD-10-CM

## 2020-10-31 DIAGNOSIS — R0609 Other forms of dyspnea: Secondary | ICD-10-CM

## 2020-10-31 MED ORDER — ROPINIROLE HCL 0.25 MG PO TABS: 0.2500 mg | ORAL_TABLET | Freq: Three times a day (TID) | ORAL | 1 refills | Status: DC

## 2020-10-31 NOTE — Progress Notes (Signed)
[facility] [place], [place] [address]  Internal MEDICINE  Office Visit Note  Patient Name: [name]  [id number]  [id number]  Date of Service: 11/01/2020  Chief Complaint  Patient presents with  . COPD         HPI Pt is here for pulmonary visit. She continues to have sob, maintained on O2 3 Ln/c at this time. Does have multiple medical problems. H/O of recurrent atrial fibrillation and COPD exacerbations requiring hospitalizations. Since the last visit, she has been doing well with the exception of AV node ablation and PPM placement in June of this year She is concerned about worsening tremors, noticed moe after increasing dose of Cymbalta. She is limited in her activities   ongoing aortic surveillance status post above listed procedure. Aortic imaging today demonstrates intact proximal aortic repair    Current Medication: Outpatient Encounter Medications as of 10/31/2020  Medication Sig  . [DISCONTINUED] rOPINIRole (REQUIP) 0.25 MG tablet Take 1 tablet (0.25 mg total) by mouth 3 (three) times daily. For tremors  . acetaminophen (TYLENOL) 500 MG tablet Take 500 mg by mouth every 4 (four) hours as needed for moderate pain or headache.   . albuterol (PROVENTIL) (2.5 MG/3ML) 0.083% nebulizer solution Take 3 mLs (2.5 mg total) by nebulization every 6 (six) hours as needed for wheezing or shortness of breath.  [name] albuterol (VENTOLIN HFA) 108 (90 Base) MCG/ACT inhaler Inhale 2 puffs into the lungs every 6 (six) hours as needed for wheezing or shortness of breath.  . ALPRAZolam (XANAX) 0.25 MG tablet Take 1 tablet (0.25 mg total) by mouth 2 (two) times daily as needed for anxiety.  [name] atorvastatin (LIPITOR) 40 MG tablet 1 tab po qhs  . bumetanide (BUMEX) 1 MG tablet Take 2 tablets (2 mg total) by mouth 2 (two) times daily.  . Calcium Carbonate-Vitamin D (CALCIUM 600+D PO) Take 1 tablet by mouth daily.  . Coenzyme Q10 (COQ10) 200 MG CAPS Take 200 mg by mouth  daily.  [name] ELIQUIS 5 MG TABS tablet Take 1 tablet by mouth twice daily. (Patient taking differently: Take 5 mg by mouth 2 (two) times daily.)  . ferrous sulfate 325 (65 FE) MG tablet Take 650 mg by mouth daily with breakfast.   . flecainide (TAMBOCOR) 50 MG tablet Take 1 tablet (50 mg total) by mouth 2 (two) times daily. PLEASE CONTACT OFFICE [phone] TO SCHEDULE APPOINTMENT FOR FUTURE REFILLS  . ipratropium-albuterol (DUONEB) 0.5-2.5 (3) MG/3ML SOLN Take 3 mLs by nebulization every 6 (six) hours as needed.  [name] levothyroxine (SYNTHROID) 25 MCG tablet Take 1 tablet (25 mcg total) by mouth daily before breakfast.  . lisinopril (ZESTRIL) 40 MG tablet Take 1 tablet (40 mg total) by mouth daily.  . mupirocin nasal ointment (BACTROBAN) 2 % Use one-half of tube in each nostril twice daily for five (5) days. After application, press sides of nose together and gently massage.  . pantoprazole (PROTONIX) 40 MG tablet Take 1 tablet (40 mg total) by mouth daily.  . potassium chloride (KLOR-CON) 10 MEQ tablet Take 4 tablets (40 mEq total) by mouth 2 (two) times daily.  . traZODone (DESYREL) 50 MG tablet Take 1 tablet (50 mg total) by mouth at bedtime.  . TRELEGY ELLIPTA 100-62.5-25 MCG/INH AEPB INHALE 1 PUFF INTO THE LUNGS DAILY  . [DISCONTINUED] DULoxetine (CYMBALTA) 60 MG capsule Take 1 capsule (60 mg total) by mouth 2 (two) times daily.   No facility-administered encounter medications on file as of 10/31/2020.  Surgical History: Past Surgical History:  Procedure Laterality Date  . ABDOMINAL AORTIC ANEURYSM REPAIR  2008  . ABDOMINAL HYSTERECTOMY    . AORTIC VALVE REPLACEMENT  2008  . CARDIAC CATHETERIZATION     [facility]  . CARDIOVERSION N/A 08/15/2018   Procedure: CARDIOVERSION;  Surgeon: [name], MD;  Location: [facility] ORS;  Service: Cardiovascular;  Laterality: N/A;  . CARDIOVERSION N/A 11/14/2018   Procedure: CARDIOVERSION (CATH LAB);  Surgeon: [name], MD;  Location: [facility] ORS;   Service: Cardiovascular;  Laterality: N/A;  . CARDIOVERSION N/A 06/05/2019   Procedure: CARDIOVERSION;  Surgeon: [name], MD;  Location: [facility] ORS;  Service: Cardiovascular;  Laterality: N/A;  . CARDIOVERSION N/A 08/14/2019   Procedure: CARDIOVERSION;  Surgeon: [name], MD;  Location: [facility] ORS;  Service: Cardiovascular;  Laterality: N/A;  . CARDIOVERSION N/A 11/09/2019   Procedure: CARDIOVERSION;  Surgeon: Isaias Cowman, MD;  Location: [facility] ORS;  Service: Cardiovascular;  Laterality: N/A;  . CARDIOVERSION N/A 01/17/2020   Procedure: CARDIOVERSION;  Surgeon: Isaias Cowman, MD;  Location: [facility] ORS;  Service: Cardiovascular;  Laterality: N/A;  . CARPAL TUNNEL RELEASE    . ELECTROPHYSIOLOGIC STUDY N/A 08/17/2016   Procedure: Cardioversion;  Surgeon: [name], MD;  Location: [facility] ORS;  Service: Cardiovascular;  Laterality: N/A;  . TUMOR EXCISION Left    x3 (arm)    Medical History: Past Medical History:  Diagnosis Date  . 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 s/p bioprosthetic valve replacement 2008 at [facility];  b. 01/2015 Echo: EF 60-65%, no rwma, Gr1 DD, mildly dil LA, nl RV fxn.  . Atrial fibrillation with RVR ([place]) 01/11/2015  . Atrial flutter ([place])    a. 08/2016 s/p DCCV.  Remains on flecainide 50 mg bid.  . Basal skull fracture ([place]) 20 yrs ago  . CHF (congestive heart failure) ([place])   . Chronic respiratory failure ([place])   . COPD (chronic obstructive pulmonary disease) ([place])    a. on home O2 at 2L since 2008  . 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 Essential hypertension 01/24/2015  . History of cardiac cath    a. 2008 prior to Aortic aneurysm repair-->nl cors.  . History of stress test    a. 10/2015 MV: no ischemia/infarct.  [name] HLD (hyperlipidemia)   . HTN (hypertension)   . Hypothyroidism   . Obesity   . PAF (paroxysmal atrial fibrillation) ([name])    a. on Eliquis; b. CHADS2VASc = 3 (HTN, age x 1, female).  .  Paroxysmal atrial fibrillation ([place]) 01/24/2015  . Right upper quadrant abdominal tenderness without rebound tenderness 03/02/2018  . S/P ascending aortic aneurysm repair 2008    Family History: Family History  Problem Relation Age of Onset  . Stroke Father   . Stroke Paternal Grandmother     Social History   Socioeconomic History  . Marital status: Single    Spouse name: Not on file  . Number of children: Not on file  . Years of education: Not on file  . Highest education level: Not on file  Occupational History  . Not on file  Tobacco Use  . Smoking status: Former Smoker    Types: Cigarettes  . Smokeless tobacco: Never Used  Vaping Use  . Vaping Use: Never used  Substance and Sexual Activity  . Alcohol use: No  . Drug use: No  . Sexual activity: Never    Birth  control/protection: Surgical  Other Topics Concern  . Not on file  Social History Narrative  . Not on file   Social Determinants of Health   Financial Resource Strain: Not on file  Food Insecurity: Not on file  Transportation Needs: Not on file  Physical Activity: Not on file  Stress: Not on file  Social Connections: Not on file  Intimate Partner Violence: Not on file      Review of Systems  Constitutional: Negative for chills, diaphoresis and fatigue.  HENT: Negative for ear pain, postnasal drip and sinus pressure.   Eyes: Negative for photophobia, discharge, redness, itching and visual disturbance.  Respiratory: Positive for shortness of breath. Negative for cough and wheezing.   Cardiovascular: Negative for chest pain, palpitations and leg swelling.  Gastrointestinal: Negative for abdominal pain, constipation, diarrhea, nausea and vomiting.  Genitourinary: Negative for dysuria and flank pain.  Musculoskeletal: Negative for arthralgias, back pain, gait problem and neck pain.  Skin: Negative for color change.  Allergic/Immunologic: Negative for environmental allergies and food allergies.   Neurological: Positive for tremors. Negative for dizziness and headaches.  Hematological: Does not bruise/bleed easily.  Psychiatric/Behavioral: Negative for agitation, behavioral problems (depression) and hallucinations.    Vital Signs: BP 120/70   Pulse 90   Temp 97.8 F (36.6 C)   Resp 16   Ht 4\' 10"  (1.473 m)   Wt 178 lb (80.7 kg)   SpO2 (!) 88% Comment: room air  BMI 37.20 kg/m    Physical Exam Constitutional:      General: She is not in acute distress.    Appearance: She is well-developed. She is obese. She is not diaphoretic.     Comments: With O2 L n/c   HENT:     Head: Normocephalic and atraumatic.     Mouth/Throat:     Pharynx: No oropharyngeal exudate.  Eyes:     Pupils: Pupils are equal, round, and reactive to light.  Neck:     Thyroid: No thyromegaly.     Vascular: No JVD.     Trachea: No tracheal deviation.  Cardiovascular:     Rate and Rhythm: Normal rate and regular rhythm.     Heart sounds: Normal heart sounds. No murmur heard. No friction rub. No gallop.   Pulmonary:     Effort: Pulmonary effort is normal. No respiratory distress.     Breath sounds: No wheezing or rales.  Chest:     Chest wall: No tenderness.  Abdominal:     General: Bowel sounds are normal.     Palpations: Abdomen is soft.  Musculoskeletal:        General: Normal range of motion.     Cervical back: Normal range of motion and neck supple.  Lymphadenopathy:     Cervical: No cervical adenopathy.  Skin:    General: Skin is warm and dry.  Neurological:     Mental Status: She is alert and oriented to person, place, and time.     Cranial Nerves: No cranial nerve deficit.  Psychiatric:        Behavior: Behavior normal.        Thought Content: Thought content normal.        Judgment: Judgment normal.     Assessment/Plan: 1. Dyspnea on exertion Comfortable with O2 3 L/[place]. Followed by pulmonary, stable FEV1 - Spirometry with Graph  2. Resting tremor ?? Etiology, will need  to look into PD, no gait abnormality. Will start low dose requip, meds reviewed  3. S/P placement of cardiac pacemaker Followed by cardiology   4. Depression, major, single episode, moderate ([place]) Hold off Cymbalta ?? RLS or tremors, will start another agent on next visit   General Counseling: Zonya verbalizes understanding of the findings of todays visit and agrees with plan of treatment. I have discussed any further diagnostic evaluation that may be needed or ordered today. We also reviewed her medications today. she has been encouraged to call the office with any questions or concerns that should arise related to todays visit.  Orders Placed This Encounter  Procedures  . Spirometry with Graph    Meds ordered this encounter  Medications  . DISCONTD: rOPINIRole (REQUIP) 0.25 MG tablet    Sig: Take 1 tablet (0.25 mg total) by mouth 3 (three) times daily. For tremors    Dispense:  90 tablet    Refill:  1    Total time spent: 30 Minutes Time spent includes review of chart, medications, test results, and follow up plan with the patient.   [place] Controlled Substance Database was reviewed by me.   Dr [name] Internal medicine

## 2020-11-02 DIAGNOSIS — J449 Chronic obstructive pulmonary disease, unspecified: Secondary | ICD-10-CM | POA: Diagnosis not present

## 2020-11-05 NOTE — Progress Notes (Deleted)
Patient ID: [name], female    DOB: 11-24-1945, 75 y.o.   MRN: [id number]  HPI  Ms [name] is a 75 y/o female with a history of hyperlipidemia, HTN, thyroid disease, COPD, left ear deafness, atrial fibrillation, previous tobacco use and chronic heart failure.   Echo report from 02/07/20 reviewed and showed an EF of >55% along with severe LAE. Catheterization done 02/07/20 but unable to view the results.   Admitted 05/11/20 due to shortness of breath. Initially given IV diuretics with transition to oral diuretics. Cardiology consult obtained. Inhaled steroids and nebulizer treatments done. Oxygen weaned from 5L down to baseline of 3L. Discharged after 3 days.   She presents today for a follow-up visit with a chief complaint of   Past Medical History:  Diagnosis Date  . 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 s/p bioprosthetic valve replacement 2008 at [facility];  b. 01/2015 Echo: EF 60-65%, no rwma, Gr1 DD, mildly dil LA, nl RV fxn.  . Atrial fibrillation with RVR ([place]) 01/11/2015  . Atrial flutter ([place])    a. 08/2016 s/p DCCV.  Remains on flecainide 50 mg bid.  . Basal skull fracture ([place]) 20 yrs ago  . CHF (congestive heart failure) ([place])   . Chronic respiratory failure ([place])   . COPD (chronic obstructive pulmonary disease) ([place])    a. on home O2 at 2L since 2008  . 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 Essential hypertension 01/24/2015  . History of cardiac cath    a. 2008 prior to Aortic aneurysm repair-->nl cors.  . History of stress test    a. 10/2015 MV: no ischemia/infarct.  [name] HLD (hyperlipidemia)   . HTN (hypertension)   . Hypothyroidism   . Obesity   . PAF (paroxysmal atrial fibrillation) ([name])    a. on Eliquis; b. CHADS2VASc = 3 (HTN, age x 1, female).  . Paroxysmal atrial fibrillation ([place]) 01/24/2015  . Right upper quadrant abdominal tenderness without rebound tenderness 03/02/2018  . S/P ascending aortic aneurysm repair 2008   Past Surgical  History:  Procedure Laterality Date  . ABDOMINAL AORTIC ANEURYSM REPAIR  2008  . ABDOMINAL HYSTERECTOMY    . AORTIC VALVE REPLACEMENT  2008  . CARDIAC CATHETERIZATION     [facility]  . CARDIOVERSION N/A 08/15/2018   Procedure: CARDIOVERSION;  Surgeon: [name], MD;  Location: [facility] ORS;  Service: Cardiovascular;  Laterality: N/A;  . CARDIOVERSION N/A 11/14/2018   Procedure: CARDIOVERSION (CATH LAB);  Surgeon: [name], MD;  Location: [facility] ORS;  Service: Cardiovascular;  Laterality: N/A;  . CARDIOVERSION N/A 06/05/2019   Procedure: CARDIOVERSION;  Surgeon: [name], MD;  Location: [facility] ORS;  Service: Cardiovascular;  Laterality: N/A;  . CARDIOVERSION N/A 08/14/2019   Procedure: CARDIOVERSION;  Surgeon: [name], MD;  Location: [facility] ORS;  Service: Cardiovascular;  Laterality: N/A;  . CARDIOVERSION N/A 11/09/2019   Procedure: CARDIOVERSION;  Surgeon: Isaias Cowman, MD;  Location: [facility] ORS;  Service: Cardiovascular;  Laterality: N/A;  . CARDIOVERSION N/A 01/17/2020   Procedure: CARDIOVERSION;  Surgeon: Isaias Cowman, MD;  Location: [facility] ORS;  Service: Cardiovascular;  Laterality: N/A;  . CARPAL TUNNEL RELEASE    . ELECTROPHYSIOLOGIC STUDY N/A 08/17/2016   Procedure: Cardioversion;  Surgeon: [name], MD;  Location: [facility] ORS;  Service: Cardiovascular;  Laterality: N/A;  . TUMOR EXCISION Left    x3 (arm)   Family History  Problem  Relation Age of Onset  . Stroke Father   . Stroke Paternal Grandmother    Social History   Tobacco Use  . Smoking status: Former Smoker    Types: Cigarettes  . Smokeless tobacco: Never Used  Substance Use Topics  . Alcohol use: No   Allergies  Allergen Reactions  . Benadryl [Diphenhydramine Hcl (Sleep)] Palpitations  . Cetirizine Palpitations  . Lasix [Furosemide] Rash  . Levaquin [Levofloxacin In D5w] Other (See Comments)    Reaction:  Fatigue and muscle soreness  . Meloxicam Rash  . Soy Allergy Hives and  Nausea And Vomiting  . Sulfa Antibiotics Rash     Review of Systems  Constitutional: Positive for fatigue (tire easily). Negative for appetite change.  HENT: Positive for hearing loss. Negative for congestion and sore throat.   Eyes: Negative.   Respiratory: Positive for cough and shortness of breath (minimal). Negative for wheezing.   Cardiovascular: Negative for chest pain, palpitations and leg swelling.  Gastrointestinal: Negative for abdominal distention and abdominal pain.  Endocrine: Negative.   Genitourinary: Negative.   Musculoskeletal: Positive for arthralgias (bilateral shoulder pain). Negative for back pain.  Skin: Negative.   Allergic/Immunologic: Negative.   Neurological: Positive for light-headedness (at times). Negative for dizziness.  Hematological: Negative for adenopathy. Does not bruise/bleed easily.  Psychiatric/Behavioral: Negative for dysphoric mood and sleep disturbance (wearing cpap at bedtime). The patient is nervous/anxious.      Physical Exam Vitals and nursing note reviewed.  Constitutional:      Appearance: Normal appearance.  HENT:     Head: Normocephalic and atraumatic.     Left Ear: Decreased hearing noted.  Cardiovascular:     Rate and Rhythm: Normal rate and regular rhythm.  Pulmonary:     Effort: Pulmonary effort is normal. No respiratory distress.     Breath sounds: No wheezing or rales.  Abdominal:     General: There is no distension.     Palpations: Abdomen is soft.  Musculoskeletal:     Cervical back: Normal range of motion and neck supple.     Right lower leg: No edema.     Left lower leg: No edema.  Skin:    General: Skin is warm and dry.  Neurological:     General: No focal deficit present.     Mental Status: She is alert and oriented to person, place, and time.  Psychiatric:        Mood and Affect: Mood normal.        Behavior: Behavior normal.        Thought Content: Thought content normal.    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 (LAE)- - NYHA class III - euvolemic today - weighing daily; reminded to call for an overnight weight gain of >2 pounds or a weekly weight gain of >5 pounds - weight 177pounds from last visit here 3 months ago - not adding salt and is using pepper for seasoning; reviewed the importance of closely following a low sodium diet  - saw cardiology ([place]) 08/12/20 - saw cardiothoracic provider Ysidro Evert) 09/04/20 for aortic valve - has had previous valve surgery and returns to surgeon next month for routine follow-up - BNP 05/14/20 was 638.3 - reports receiving both covid vaccines  2: HTN- - BP  - saw PCP Stephanie Coup) 10/31/20 - BMP 08/06/20 reviewed and showed sodium 137, potassium 3.7, creatinine 0.74 and GFR >60  3: COPD- - wearing oxygen at  3L - saw pulmonology Kenton Kingfisher) 10/03/20  4: Atrial fibrillation-  - pacemaker placed June 2021 - has had multiple cardioversions   Patient did not bring her medications nor a list. Each medication was verbally reviewed with the patient and she was encouraged to bring the bottles to every visit to confirm accuracy of list.

## 2020-11-06 ENCOUNTER — Telehealth: Payer: Self-pay

## 2020-11-06 ENCOUNTER — Ambulatory Visit: Payer: Medicare Other | Admitting: Family

## 2020-11-07 ENCOUNTER — Other Ambulatory Visit: Payer: Self-pay | Admitting: Internal Medicine

## 2020-11-07 MED ORDER — OXYCODONE-ACETAMINOPHEN 7.5-325 MG PO TABS: ORAL_TABLET | ORAL | 0 refills | Status: DC

## 2020-11-07 NOTE — Telephone Encounter (Signed)
I spoke with her and answered her questions, medicine is FDA approved for multiple indications like tremors RLS etc

## 2020-11-14 ENCOUNTER — Ambulatory Visit (INDEPENDENT_AMBULATORY_CARE_PROVIDER_SITE_OTHER): Payer: Medicare Other | Admitting: Hospice and Palliative Medicine

## 2020-11-14 ENCOUNTER — Encounter: Payer: Self-pay | Admitting: Hospice and Palliative Medicine

## 2020-11-14 ENCOUNTER — Other Ambulatory Visit: Payer: Self-pay

## 2020-11-14 VITALS — BP 132/80 | HR 87 | Temp 97.4°F | Resp 16 | Ht <= 58 in | Wt 177.6 lb

## 2020-11-14 DIAGNOSIS — R5383 Other fatigue: Secondary | ICD-10-CM

## 2020-11-14 DIAGNOSIS — J9611 Chronic respiratory failure with hypoxia: Secondary | ICD-10-CM

## 2020-11-14 DIAGNOSIS — F321 Major depressive disorder, single episode, moderate: Secondary | ICD-10-CM | POA: Diagnosis not present

## 2020-11-14 DIAGNOSIS — G252 Other specified forms of tremor: Secondary | ICD-10-CM | POA: Diagnosis not present

## 2020-11-14 MED ORDER — SERTRALINE HCL 50 MG PO TABS: 50.0000 mg | ORAL_TABLET | Freq: Every day | ORAL | 0 refills | Status: DC

## 2020-11-14 NOTE — Progress Notes (Signed)
[facility] [place], [place] [address]  Internal MEDICINE  Office Visit Note  Patient Name: [name]  [id number]  [id number]  Date of Service: 11/16/2020  Chief Complaint  Patient presents with  . Acute Visit    Not sleeping, meds not working, hip pain, started 2 weeks ago, discuss meds     HPI Pt is here for a sick visit. At last visit she was started on Ropinirole for resting tremor, unable to tolerate, caused severe anxiety each night, tried medication for over a week  Reports that since stopping Cymbalta her tremors have improved but feels as though anxiety and depression are not well controlled  Feels sad most days and gets easily overwhlemed  Current Medication:  Outpatient Encounter Medications as of 11/14/2020  Medication Sig  . sertraline (ZOLOFT) 50 MG tablet Take 1 tablet (50 mg total) by mouth daily.  [name] acetaminophen (TYLENOL) 500 MG tablet Take 500 mg by mouth every 4 (four) hours as needed for moderate pain or headache.   . albuterol (PROVENTIL) (2.5 MG/3ML) 0.083% nebulizer solution Take 3 mLs (2.5 mg total) by nebulization every 6 (six) hours as needed for wheezing or shortness of breath.  . ALPRAZolam (XANAX) 0.25 MG tablet Take 1 tablet (0.25 mg total) by mouth 2 (two) times daily as needed for anxiety.  [name] atorvastatin (LIPITOR) 40 MG tablet 1 tab po qhs  . bumetanide (BUMEX) 1 MG tablet Take 2 tablets (2 mg total) by mouth 2 (two) times daily.  . Calcium Carbonate-Vitamin D (CALCIUM 600+D PO) Take 1 tablet by mouth daily.  . Coenzyme Q10 (COQ10) 200 MG CAPS Take 200 mg by mouth daily.  [name] ELIQUIS 5 MG TABS tablet Take 1 tablet by mouth twice daily. (Patient taking differently: Take 5 mg by mouth 2 (two) times daily.)  . ferrous sulfate 325 (65 FE) MG tablet Take 650 mg by mouth daily with breakfast.   . flecainide (TAMBOCOR) 50 MG tablet Take 1 tablet (50 mg total) by mouth 2 (two) times daily. PLEASE CONTACT OFFICE [phone]  TO SCHEDULE APPOINTMENT FOR FUTURE REFILLS  . levothyroxine (SYNTHROID) 25 MCG tablet Take 1 tablet (25 mcg total) by mouth daily before breakfast.  . lisinopril (ZESTRIL) 40 MG tablet Take 1 tablet (40 mg total) by mouth daily.  . mupirocin nasal ointment (BACTROBAN) 2 % Use one-half of tube in each nostril twice daily for five (5) days. After application, press sides of nose together and gently massage.  [name] oxyCODONE-acetaminophen (PERCOCET) 7.5-325 MG tablet Take half to one tab po qd prn for pain  . pantoprazole (PROTONIX) 40 MG tablet Take 1 tablet (40 mg total) by mouth daily.  . potassium chloride (KLOR-CON) 10 MEQ tablet Take 4 tablets (40 mEq total) by mouth 2 (two) times daily.  . traZODone (DESYREL) 50 MG tablet Take 1 tablet (50 mg total) by mouth at bedtime.  . TRELEGY ELLIPTA 100-62.5-25 MCG/INH AEPB INHALE 1 PUFF INTO THE LUNGS DAILY  . [DISCONTINUED] albuterol (VENTOLIN HFA) 108 (90 Base) MCG/ACT inhaler Inhale 2 puffs into the lungs every 6 (six) hours as needed for wheezing or shortness of breath. (Patient not taking: Reported on 11/15/2020)  . [DISCONTINUED] ipratropium-albuterol (DUONEB) 0.5-2.5 (3) MG/3ML SOLN Take 3 mLs by nebulization every 6 (six) hours as needed. (Patient not taking: Reported on 11/15/2020)  . [DISCONTINUED] rOPINIRole (REQUIP) 0.25 MG tablet Take 1 tablet (0.25 mg total) by mouth 3 (three) times daily.   No facility-administered encounter medications on  file as of 11/14/2020.      Medical History: Past Medical History:  Diagnosis Date  . 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 s/p bioprosthetic valve replacement 2008 at [facility];  b. 01/2015 Echo: EF 60-65%, no rwma, Gr1 DD, mildly dil LA, nl RV fxn.  . Atrial fibrillation with RVR ([place]) 01/11/2015  . Atrial flutter ([place])    a. 08/2016 s/p DCCV.  Remains on flecainide 50 mg bid.  . Basal skull fracture ([place]) 20 yrs ago  . CHF (congestive heart failure) ([place])   . Chronic respiratory failure ([place])    . COPD (chronic obstructive pulmonary disease) ([place])    a. on home O2 at 2L since 2008  . 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 Essential hypertension 01/24/2015  . History of cardiac cath    a. 2008 prior to Aortic aneurysm repair-->nl cors.  . History of stress test    a. 10/2015 MV: no ischemia/infarct.  [name] HLD (hyperlipidemia)   . HTN (hypertension)   . Hypothyroidism   . Obesity   . PAF (paroxysmal atrial fibrillation) ([name])    a. on Eliquis; b. CHADS2VASc = 3 (HTN, age x 1, female).  . Paroxysmal atrial fibrillation ([place]) 01/24/2015  . Right upper quadrant abdominal tenderness without rebound tenderness 03/02/2018  . S/P ascending aortic aneurysm repair 2008     Vital Signs: BP 132/80   Pulse 87   Temp (!) 97.4 F (36.3 C)   Resp 16   Ht 4\' 10"  (1.473 m)   Wt 177 lb 9.6 oz (80.6 kg)   SpO2 98% Comment: 2L of oxygen  BMI 37.12 kg/m    Review of Systems  Constitutional: Negative for chills, diaphoresis and fatigue.  HENT: Negative for ear pain, postnasal drip and sinus pressure.   Eyes: Negative for photophobia, discharge, redness, itching and visual disturbance.  Respiratory: Negative for cough, shortness of breath and wheezing.   Cardiovascular: Negative for chest pain, palpitations and leg swelling.  Gastrointestinal: Negative for abdominal pain, constipation, diarrhea, nausea and vomiting.  Genitourinary: Negative for dysuria and flank pain.  Musculoskeletal: Negative for arthralgias, back pain, gait problem and neck pain.  Skin: Negative for color change.  Allergic/Immunologic: Negative for environmental allergies and food allergies.  Neurological: Negative for dizziness and headaches.  Hematological: Does not bruise/bleed easily.  Psychiatric/Behavioral: Positive for behavioral problems (depression) and sleep disturbance. Negative for agitation and hallucinations.    Physical Exam Vitals reviewed.  Constitutional:      Appearance:  Normal appearance. She is obese.  Cardiovascular:     Rate and Rhythm: Normal rate and regular rhythm.     Pulses: Normal pulses.     Heart sounds: Normal heart sounds.  Pulmonary:     Effort: Pulmonary effort is normal.     Breath sounds: Normal breath sounds.  Abdominal:     General: Abdomen is flat.     Palpations: Abdomen is soft.  Musculoskeletal:        General: Normal range of motion.     Cervical back: Normal range of motion.  Skin:    General: Skin is warm.  Neurological:     General: No focal deficit present.     Mental Status: She is alert and oriented to person, place, and time. Mental status is at baseline.  Psychiatric:        Mood and Affect: Mood normal.  Behavior: Behavior normal.        Thought Content: Thought content normal.        Judgment: Judgment normal.    Assessment/Plan: 1. Resting tremor Tremor not apparent today, continue to hold Cymbalta, unable to tolerate requip Continue to monitor  2. Depression, major, single episode, moderate ([name]) Continue to hold Cymbalta--start low dose Zoloft for depressive symptoms Follow-up in 6 weeks - sertraline (ZOLOFT) 50 MG tablet; Take 1 tablet (50 mg total) by mouth daily.  Dispense: 90 tablet; Refill: 0  3. Other fatigue - CBC w/Diff/Platelet - Comprehensive Metabolic Panel (CMET) - Lipid Panel With LDL/HDL Ratio - TSH + free T4 - Vitamin D (25 hydroxy) - B12  4. Chronic respiratory failure with hypoxia ([place]) Continue with supportive measures, followed by pulmonary  General Counseling: [name] verbalizes understanding of the findings of todays visit and agrees with plan of treatment. I have discussed any further diagnostic evaluation that may be needed or ordered today. We also reviewed her medications today. she has been encouraged to call the office with any questions or concerns that should arise related to todays visit.   Orders Placed This Encounter  Procedures  . CBC w/Diff/Platelet  .  Comprehensive Metabolic Panel (CMET)  . Lipid Panel With LDL/HDL Ratio  . TSH + free T4  . Vitamin D (25 hydroxy)  . B12    Meds ordered this encounter  Medications  . sertraline (ZOLOFT) 50 MG tablet    Sig: Take 1 tablet (50 mg total) by mouth daily.    Dispense:  90 tablet    Refill:  0    Time spent: 30 Minutes  This patient was seen by Theodoro Grist AGNP-C in Collaboration with Dr [name] as a part of collaborative care agreement.  [facility] Internal Medicine

## 2020-11-15 ENCOUNTER — Encounter: Payer: Self-pay | Admitting: Family

## 2020-11-15 ENCOUNTER — Other Ambulatory Visit: Payer: Self-pay

## 2020-11-15 ENCOUNTER — Ambulatory Visit: Payer: Medicare Other | Attending: Family | Admitting: Family

## 2020-11-15 VITALS — BP 139/78 | HR 79 | Resp 18 | Ht <= 58 in | Wt 176.0 lb

## 2020-11-15 DIAGNOSIS — R42 Dizziness and giddiness: Secondary | ICD-10-CM | POA: Insufficient documentation

## 2020-11-15 DIAGNOSIS — Z87891 Personal history of nicotine dependence: Secondary | ICD-10-CM | POA: Diagnosis not present

## 2020-11-15 DIAGNOSIS — Z950 Presence of cardiac pacemaker: Secondary | ICD-10-CM | POA: Insufficient documentation

## 2020-11-15 DIAGNOSIS — I5032 Chronic diastolic (congestive) heart failure: Secondary | ICD-10-CM

## 2020-11-15 DIAGNOSIS — R110 Nausea: Secondary | ICD-10-CM | POA: Insufficient documentation

## 2020-11-15 DIAGNOSIS — Z882 Allergy status to sulfonamides status: Secondary | ICD-10-CM | POA: Diagnosis not present

## 2020-11-15 DIAGNOSIS — Z952 Presence of prosthetic heart valve: Secondary | ICD-10-CM | POA: Diagnosis not present

## 2020-11-15 DIAGNOSIS — I110 Hypertensive heart disease with heart failure: Secondary | ICD-10-CM | POA: Diagnosis not present

## 2020-11-15 DIAGNOSIS — Z7901 Long term (current) use of anticoagulants: Secondary | ICD-10-CM | POA: Diagnosis not present

## 2020-11-15 DIAGNOSIS — R0982 Postnasal drip: Secondary | ICD-10-CM | POA: Insufficient documentation

## 2020-11-15 DIAGNOSIS — Z888 Allergy status to other drugs, medicaments and biological substances status: Secondary | ICD-10-CM | POA: Diagnosis not present

## 2020-11-15 DIAGNOSIS — I4891 Unspecified atrial fibrillation: Secondary | ICD-10-CM | POA: Insufficient documentation

## 2020-11-15 DIAGNOSIS — J449 Chronic obstructive pulmonary disease, unspecified: Secondary | ICD-10-CM | POA: Insufficient documentation

## 2020-11-15 DIAGNOSIS — F32A Depression, unspecified: Secondary | ICD-10-CM | POA: Diagnosis not present

## 2020-11-15 DIAGNOSIS — Z881 Allergy status to other antibiotic agents status: Secondary | ICD-10-CM | POA: Diagnosis not present

## 2020-11-15 DIAGNOSIS — I480 Paroxysmal atrial fibrillation: Secondary | ICD-10-CM

## 2020-11-15 DIAGNOSIS — M25519 Pain in unspecified shoulder: Secondary | ICD-10-CM | POA: Diagnosis not present

## 2020-11-15 DIAGNOSIS — I10 Essential (primary) hypertension: Secondary | ICD-10-CM

## 2020-11-15 NOTE — Patient Instructions (Signed)
Continue weighing daily and call for an overnight weight gain of > 2 pounds or a weekly weight gain of >5 pounds. 

## 2020-11-15 NOTE — Progress Notes (Signed)
Patient ID: [name], female    DOB: 04/08/1946, 75 y.o.   MRN: [id number]  HPI  Ms Tomasetti is a 75 y/o female with a history of hyperlipidemia, HTN, thyroid disease, COPD, left ear deafness, atrial fibrillation, previous tobacco use and chronic heart failure.   Echo report from 02/07/20 reviewed and showed an EF of >55% along with severe LAE. Catheterization done 02/07/20 but unable to view the results.   Admitted 05/11/20 due to shortness of breath. Initially given IV diuretics with transition to oral diuretics. Cardiology consult obtained. Inhaled steroids and nebulizer treatments done. Oxygen weaned from 5L down to baseline of 3L. Discharged after 3 days.   She presents today for a follow-up visit with a chief complaint of moderate fatigue upon minimal exertion. She describes this as chronic in nature having been present for several years. She has associated cough, shortness of breath, nausea (due to postnasal drainage), light-headedness, depression, shoulder pain and decreased appetite along with this. She denies any difficulty sleeping, abdominal distention, palpitations, pedal edema, chest pain or weight gain.   Was just prescribes zoloft at PCP's office yesterday and is going to pick it up today.   Past Medical History:  Diagnosis Date  . 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 s/p bioprosthetic valve replacement 2008 at [facility];  b. 01/2015 Echo: EF 60-65%, no rwma, Gr1 DD, mildly dil LA, nl RV fxn.  . Atrial fibrillation with RVR ([place]) 01/11/2015  . Atrial flutter ([place])    a. 08/2016 s/p DCCV.  Remains on flecainide 50 mg bid.  . Basal skull fracture ([place]) 20 yrs ago  . CHF (congestive heart failure) ([place])   . Chronic respiratory failure ([place])   . COPD (chronic obstructive pulmonary disease) ([place])    a. on home O2 at 2L since 2008  . 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 Essential hypertension 01/24/2015  . History of cardiac cath    a. 2008 prior to  Aortic aneurysm repair-->nl cors.  . History of stress test    a. 10/2015 MV: no ischemia/infarct.  [name] HLD (hyperlipidemia)   . HTN (hypertension)   . Hypothyroidism   . Obesity   . PAF (paroxysmal atrial fibrillation) ([name])    a. on Eliquis; b. CHADS2VASc = 3 (HTN, age x 1, female).  . Paroxysmal atrial fibrillation ([place]) 01/24/2015  . Right upper quadrant abdominal tenderness without rebound tenderness 03/02/2018  . S/P ascending aortic aneurysm repair 2008   Past Surgical History:  Procedure Laterality Date  . ABDOMINAL AORTIC ANEURYSM REPAIR  2008  . ABDOMINAL HYSTERECTOMY    . AORTIC VALVE REPLACEMENT  2008  . CARDIAC CATHETERIZATION     [facility]  . CARDIOVERSION N/A 08/15/2018   Procedure: CARDIOVERSION;  Surgeon: [name], MD;  Location: [facility] ORS;  Service: Cardiovascular;  Laterality: N/A;  . CARDIOVERSION N/A 11/14/2018   Procedure: CARDIOVERSION (CATH LAB);  Surgeon: [name], MD;  Location: [facility] ORS;  Service: Cardiovascular;  Laterality: N/A;  . CARDIOVERSION N/A 06/05/2019   Procedure: CARDIOVERSION;  Surgeon: [name], MD;  Location: [facility] ORS;  Service: Cardiovascular;  Laterality: N/A;  . CARDIOVERSION N/A 08/14/2019   Procedure: CARDIOVERSION;  Surgeon: [name], MD;  Location: [facility] ORS;  Service: Cardiovascular;  Laterality: N/A;  . CARDIOVERSION N/A 11/09/2019   Procedure: CARDIOVERSION;  Surgeon: Isaias Cowman, MD;  Location: [facility] ORS;  Service: Cardiovascular;  Laterality: N/A;  .  CARDIOVERSION N/A 01/17/2020   Procedure: CARDIOVERSION;  Surgeon: Isaias Cowman, MD;  Location: [facility] ORS;  Service: Cardiovascular;  Laterality: N/A;  . CARPAL TUNNEL RELEASE    . ELECTROPHYSIOLOGIC STUDY N/A 08/17/2016   Procedure: Cardioversion;  Surgeon: [name], MD;  Location: [facility] ORS;  Service: Cardiovascular;  Laterality: N/A;  . TUMOR EXCISION Left    x3 (arm)   Family History  Problem Relation Age of Onset  . Stroke Father   .  Stroke Paternal Grandmother    Social History   Tobacco Use  . Smoking status: Former Smoker    Types: Cigarettes  . Smokeless tobacco: Never Used  Substance Use Topics  . Alcohol use: No   Allergies  Allergen Reactions  . Benadryl [Diphenhydramine Hcl (Sleep)] Palpitations  . Cetirizine Palpitations  . Lasix [Furosemide] Rash  . Levaquin [Levofloxacin In D5w] Other (See Comments)    Reaction:  Fatigue and muscle soreness  . Meloxicam Rash  . Soy Allergy Hives and Nausea And Vomiting  . Sulfa Antibiotics Rash   Prior to Admission medications   Medication Sig Start Date End Date Taking? Authorizing Provider  acetaminophen (TYLENOL) 500 MG tablet Take 500 mg by mouth every 4 (four) hours as needed for moderate pain or headache.    Yes [provider]  albuterol (PROVENTIL) (2.5 MG/3ML) 0.083% nebulizer solution Take 3 mLs (2.5 mg total) by nebulization every 6 (six) hours as needed for wheezing or shortness of breath. 04/15/20  Yes [place], Audie Clear, NP  ALPRAZolam Duanne Moron) 0.25 MG tablet Take 1 tablet (0.25 mg total) by mouth 2 (two) times daily as needed for anxiety. 10/03/20  Yes Luiz Ochoa, NP  atorvastatin (LIPITOR) 40 MG tablet 1 tab po qhs 08/15/20  Yes [name], MD  bumetanide (BUMEX) 1 MG tablet Take 2 tablets (2 mg total) by mouth 2 (two) times daily. 08/20/20 09/19/20 Yes Bryker Fletchall, Otila Kluver A, FNP  Calcium Carbonate-Vitamin D (CALCIUM 600+D PO) Take 1 tablet by mouth daily.   Yes [provider]  Coenzyme Q10 (COQ10) 200 MG CAPS Take 200 mg by mouth daily.   Yes [provider]  ELIQUIS 5 MG TABS tablet Take 1 tablet by mouth twice daily. Patient taking differently: Take 5 mg by mouth 2 (two) times daily. 10/26/19  Yes [name], MD  ferrous sulfate 325 (65 FE) MG tablet Take 650 mg by mouth daily with breakfast.    Yes [provider]  flecainide (TAMBOCOR) 50 MG tablet Take 1 tablet (50 mg total) by mouth 2 (two) times daily.  PLEASE CONTACT OFFICE [phone] TO SCHEDULE APPOINTMENT FOR FUTURE REFILLS 02/23/20  Yes Deboraha Sprang, MD  levothyroxine (SYNTHROID) 25 MCG tablet Take 1 tablet (25 mcg total) by mouth daily before breakfast. 07/15/20  Yes Boscia, Heather E, NP  lisinopril (ZESTRIL) 40 MG tablet Take 1 tablet (40 mg total) by mouth daily. 10/07/20 01/05/21 Yes Alisa Graff, FNP  mupirocin nasal ointment (BACTROBAN) 2 % Use one-half of tube in each nostril twice daily for five (5) days. After application, press sides of nose together and gently massage. 08/20/20  Yes [name], MD  oxyCODONE-acetaminophen (PERCOCET) 7.5-325 MG tablet Take half to one tab po qd prn for pain 11/07/20  Yes [name], MD  pantoprazole (PROTONIX) 40 MG tablet Take 1 tablet (40 mg total) by mouth daily. 07/15/20  Yes Boscia, Heather E, NP  potassium chloride (KLOR-CON) 10 MEQ tablet Take 4 tablets (40 mEq  total) by mouth 2 (two) times daily. 08/20/20  Yes Shaquina Gillham, Otila Kluver A, FNP  sertraline (ZOLOFT) 50 MG tablet Take 1 tablet (50 mg total) by mouth daily. 11/14/20  Yes Luiz Ochoa, NP  traZODone (DESYREL) 50 MG tablet Take 1 tablet (50 mg total) by mouth at bedtime. 08/26/20  Yes Luiz Ochoa, NP  TRELEGY ELLIPTA 100-62.5-25 MCG/INH AEPB INHALE 1 PUFF INTO THE LUNGS DAILY 10/09/20  Yes [name], MD    Review of Systems  Constitutional: Positive for appetite change (decreased) and fatigue (tire easily).  HENT: Positive for hearing loss. Negative for congestion and sore throat.   Eyes: Negative.   Respiratory: Positive for cough and shortness of breath (minimal). Negative for wheezing.   Cardiovascular: Negative for chest pain, palpitations and leg swelling.  Gastrointestinal: Positive for nausea (due to drainage). Negative for abdominal distention and abdominal pain.  Endocrine: Negative.   Genitourinary: Negative.   Musculoskeletal: Positive for arthralgias (bilateral shoulder pain). Negative for back pain.  Skin:  Negative.   Allergic/Immunologic: Negative.   Neurological: Positive for light-headedness (at times). Negative for dizziness.  Hematological: Negative for adenopathy. Does not bruise/bleed easily.  Psychiatric/Behavioral: Positive for dysphoric mood. Negative for sleep disturbance (wearing cpap at bedtime). The patient is not nervous/anxious.    Vitals:   11/15/20 1353  BP: 139/78  Pulse: 79  Resp: 18  SpO2: 93%  Weight: 176 lb (79.8 kg)  Height: 4\' 10"  (1.473 m)   Wt Readings from Last 3 Encounters:  11/15/20 176 lb (79.8 kg)  11/14/20 177 lb 9.6 oz (80.6 kg)  10/31/20 178 lb (80.7 kg)   Lab Results  Component Value Date   CREATININE 0.74 08/06/2020   CREATININE 0.81 05/14/2020   CREATININE 0.82 05/13/2020    Physical Exam Vitals and nursing note reviewed.  Constitutional:      Appearance: Normal appearance.  HENT:     Head: Normocephalic and atraumatic.     Left Ear: Decreased hearing noted.  Cardiovascular:     Rate and Rhythm: Normal rate and regular rhythm.  Pulmonary:     Effort: Pulmonary effort is normal. No respiratory distress.     Breath sounds: No wheezing or rales.  Abdominal:     General: There is no distension.     Palpations: Abdomen is soft.  Musculoskeletal:     Cervical back: Normal range of motion and neck supple.     Right lower leg: No edema.     Left lower leg: No edema.  Skin:    General: Skin is warm and dry.  Neurological:     General: No focal deficit present.     Mental Status: She is alert and oriented to person, place, and time.  Psychiatric:        Mood and Affect: Mood normal.        Behavior: Behavior normal.        Thought Content: Thought content normal.    Assessment & Plan:  1: Chronic heart failure with preserved ejection fraction along with structural changes (LAE)- - NYHA class III - euvolemic today - weighing daily; reminded to call for an overnight weight gain of >2 pounds or a weekly weight gain of >5 pounds -  weight stable from last visit here 3 months ago - not adding salt and is using pepper for seasoning; reviewed the importance of closely following a low sodium diet  - saw cardiology ([place]as[place]) 08/12/20; returns mid-April - saw cardiothoracic provider Ysidro Evert) 09/04/20 for  aortic valve - has had previous valve surgery  - BNP 05/14/20 was 638.3 - reports receiving both covid vaccines  2: HTN- - BP looks good today - saw PCP Stephanie Coup) 10/31/20 - BMP 08/06/20 reviewed and showed sodium 137, potassium 3.7, creatinine 0.74 and GFR >60  3: COPD- - wearing oxygen at 2-3L around the clock - saw pulmonology Kenton Kingfisher) 10/03/20 & zoloft was recently prescribed; patient to pick up RX today  4: Atrial fibrillation-  - pacemaker placed June 2021 - has had multiple cardioversions   Patient did not bring her medications nor a list. Each medication was verbally reviewed with the patient and she was encouraged to bring the bottles to every visit to confirm accuracy of list.  Return in 6 months or sooner for any questions/problems before then.

## 2020-11-19 ENCOUNTER — Other Ambulatory Visit: Payer: Self-pay | Admitting: Hospice and Palliative Medicine

## 2020-11-20 ENCOUNTER — Other Ambulatory Visit: Payer: Self-pay | Admitting: Hospice and Palliative Medicine

## 2020-11-20 DIAGNOSIS — F411 Generalized anxiety disorder: Secondary | ICD-10-CM

## 2020-11-20 DIAGNOSIS — J9611 Chronic respiratory failure with hypoxia: Secondary | ICD-10-CM

## 2020-11-20 MED ORDER — ALPRAZOLAM 0.25 MG PO TABS: 0.2500 mg | ORAL_TABLET | Freq: Two times a day (BID) | ORAL | 1 refills | Status: DC | PRN

## 2020-11-20 NOTE — Addendum Note (Signed)
Addended by: Corlis Hove on: 11/20/2020 12:32 PM   Modules accepted: Orders

## 2020-11-21 DIAGNOSIS — Z950 Presence of cardiac pacemaker: Secondary | ICD-10-CM | POA: Diagnosis not present

## 2020-11-25 ENCOUNTER — Other Ambulatory Visit: Payer: Self-pay

## 2020-11-25 DIAGNOSIS — R5383 Other fatigue: Secondary | ICD-10-CM | POA: Diagnosis not present

## 2020-11-26 LAB — TSH+FREE T4
Free T4: 1.2 ng/dL (ref 0.82–1.77)
TSH: 3.98 u[IU]/mL (ref 0.450–4.500)

## 2020-11-26 LAB — COMPREHENSIVE METABOLIC PANEL
ALT: 14 IU/L (ref 0–32)
AST: 18 IU/L (ref 0–40)
Albumin/Globulin Ratio: 1.5 (ref 1.2–2.2)
Albumin: 4 g/dL (ref 3.7–4.7)
Alkaline Phosphatase: 94 IU/L (ref 44–121)
BUN/Creatinine Ratio: 22 (ref 12–28)
BUN: 19 mg/dL (ref 8–27)
Bilirubin Total: 0.2 mg/dL (ref 0.0–1.2)
CO2: 25 mmol/L (ref 20–29)
Calcium: 9.8 mg/dL (ref 8.7–10.3)
Chloride: 99 mmol/L (ref 96–106)
Creatinine, Ser: 0.86 mg/dL (ref 0.57–1.00)
Globulin, Total: 2.6 g/dL (ref 1.5–4.5)
Glucose: 97 mg/dL (ref 65–99)
Potassium: 4.2 mmol/L (ref 3.5–5.2)
Sodium: 140 mmol/L (ref 134–144)
Total Protein: 6.6 g/dL (ref 6.0–8.5)
eGFR: 71 mL/min/{1.73_m2} (ref >59)

## 2020-11-26 LAB — CBC WITH DIFFERENTIAL/PLATELET
Basophils Absolute: 0 10*3/uL (ref 0.0–0.2)
Basos: 1 %
EOS (ABSOLUTE): 0.2 10*3/uL (ref 0.0–0.4)
Eos: 3 %
Hematocrit: 37.6 % (ref 34.0–46.6)
Hemoglobin: 11.9 g/dL (ref 11.1–15.9)
Immature Grans (Abs): 0 10*3/uL (ref 0.0–0.1)
Immature Granulocytes: 0 %
Lymphocytes Absolute: 1.1 10*3/uL (ref 0.7–3.1)
Lymphs: 18 %
MCH: 27.8 pg (ref 26.6–33.0)
MCHC: 31.6 g/dL (ref 31.5–35.7)
MCV: 88 fL (ref 79–97)
Monocytes Absolute: 0.7 10*3/uL (ref 0.1–0.9)
Monocytes: 11 %
Neutrophils Absolute: 4.2 10*3/uL (ref 1.4–7.0)
Neutrophils: 67 %
Platelets: 317 10*3/uL (ref 150–450)
RBC: 4.28 x10E6/uL (ref 3.77–5.28)
RDW: 15 % (ref 11.7–15.4)
WBC: 6.2 10*3/uL (ref 3.4–10.8)

## 2020-11-26 LAB — VITAMIN B12: Vitamin B-12: 872 pg/mL (ref 232–1245)

## 2020-11-26 LAB — LIPID PANEL WITH LDL/HDL RATIO
Cholesterol, Total: 142 mg/dL (ref 100–199)
HDL: 54 mg/dL (ref >39)
LDL Chol Calc (NIH): 59 mg/dL (ref 0–99)
LDL/HDL Ratio: 1.1 ratio (ref 0.0–3.2)
Triglycerides: 174 mg/dL — ABNORMAL HIGH (ref 0–149)
VLDL Cholesterol Cal: 29 mg/dL (ref 5–40)

## 2020-11-26 LAB — VITAMIN D 25 HYDROXY (VIT D DEFICIENCY, FRACTURES): Vit D, 25-Hydroxy: 25.5 ng/mL — ABNORMAL LOW (ref 30.0–100.0)

## 2020-11-26 NOTE — Progress Notes (Signed)
Labs reviewed, will discuss at upcoming visit.

## 2020-11-28 ENCOUNTER — Other Ambulatory Visit: Payer: Self-pay

## 2020-11-28 DIAGNOSIS — F321 Major depressive disorder, single episode, moderate: Secondary | ICD-10-CM

## 2020-11-28 MED ORDER — POTASSIUM CHLORIDE ER 10 MEQ PO TBCR: 40.0000 meq | EXTENDED_RELEASE_TABLET | Freq: Two times a day (BID) | ORAL | 3 refills | Status: DC

## 2020-11-28 MED ORDER — SERTRALINE HCL 50 MG PO TABS: 50.0000 mg | ORAL_TABLET | Freq: Every day | ORAL | 1 refills | Status: DC

## 2020-11-30 DIAGNOSIS — J449 Chronic obstructive pulmonary disease, unspecified: Secondary | ICD-10-CM | POA: Diagnosis not present

## 2020-12-01 DIAGNOSIS — Z950 Presence of cardiac pacemaker: Secondary | ICD-10-CM | POA: Diagnosis not present

## 2020-12-02 ENCOUNTER — Other Ambulatory Visit: Payer: Self-pay | Admitting: Internal Medicine

## 2020-12-03 ENCOUNTER — Ambulatory Visit: Payer: Medicare Other | Admitting: Hospice and Palliative Medicine

## 2020-12-03 DIAGNOSIS — M5032 Other cervical disc degeneration, mid-cervical region, unspecified level: Secondary | ICD-10-CM | POA: Diagnosis not present

## 2020-12-03 DIAGNOSIS — I5033 Acute on chronic diastolic (congestive) heart failure: Secondary | ICD-10-CM | POA: Diagnosis not present

## 2020-12-03 DIAGNOSIS — Z9981 Dependence on supplemental oxygen: Secondary | ICD-10-CM | POA: Diagnosis not present

## 2020-12-03 DIAGNOSIS — J9611 Chronic respiratory failure with hypoxia: Secondary | ICD-10-CM | POA: Diagnosis not present

## 2020-12-03 DIAGNOSIS — F411 Generalized anxiety disorder: Secondary | ICD-10-CM | POA: Diagnosis not present

## 2020-12-03 DIAGNOSIS — Z952 Presence of prosthetic heart valve: Secondary | ICD-10-CM | POA: Diagnosis not present

## 2020-12-03 DIAGNOSIS — Z950 Presence of cardiac pacemaker: Secondary | ICD-10-CM | POA: Diagnosis not present

## 2020-12-03 DIAGNOSIS — I4819 Other persistent atrial fibrillation: Secondary | ICD-10-CM | POA: Diagnosis not present

## 2020-12-03 DIAGNOSIS — F321 Major depressive disorder, single episode, moderate: Secondary | ICD-10-CM | POA: Diagnosis not present

## 2020-12-03 DIAGNOSIS — Z6837 Body mass index (BMI) 37.0-37.9, adult: Secondary | ICD-10-CM | POA: Diagnosis not present

## 2020-12-03 DIAGNOSIS — J449 Chronic obstructive pulmonary disease, unspecified: Secondary | ICD-10-CM | POA: Diagnosis not present

## 2020-12-03 DIAGNOSIS — H9192 Unspecified hearing loss, left ear: Secondary | ICD-10-CM | POA: Diagnosis not present

## 2020-12-03 DIAGNOSIS — E039 Hypothyroidism, unspecified: Secondary | ICD-10-CM | POA: Diagnosis not present

## 2020-12-03 DIAGNOSIS — E669 Obesity, unspecified: Secondary | ICD-10-CM | POA: Diagnosis not present

## 2020-12-03 DIAGNOSIS — I110 Hypertensive heart disease with heart failure: Secondary | ICD-10-CM | POA: Diagnosis not present

## 2020-12-03 DIAGNOSIS — K589 Irritable bowel syndrome without diarrhea: Secondary | ICD-10-CM | POA: Diagnosis not present

## 2020-12-04 ENCOUNTER — Other Ambulatory Visit: Payer: Self-pay

## 2020-12-04 DIAGNOSIS — Z950 Presence of cardiac pacemaker: Secondary | ICD-10-CM | POA: Diagnosis not present

## 2020-12-04 DIAGNOSIS — F321 Major depressive disorder, single episode, moderate: Secondary | ICD-10-CM

## 2020-12-04 MED ORDER — SERTRALINE HCL 50 MG PO TABS: 50.0000 mg | ORAL_TABLET | Freq: Every day | ORAL | 1 refills | Status: DC

## 2020-12-05 ENCOUNTER — Telehealth: Payer: Self-pay | Admitting: Cardiovascular Disease

## 2020-12-05 NOTE — Telephone Encounter (Signed)
Last seen by Dr. Fletcher Anon in Dec 2020. Patient now sees [facility] Cardiology Dr. Saralyn Pilar and [facility].  Returned the call to [employer] @ the HF clinic to provide the update.

## 2020-12-05 NOTE — Telephone Encounter (Signed)
[name] with heart failure clinic would like all heart medications refilled. Please call her if needed.

## 2020-12-12 ENCOUNTER — Emergency Department
Admission: EM | Admit: 2020-12-12 | Discharge: 2020-12-12 | Disposition: A | Discharge status: Home / Self Care | Payer: Medicare Other | Attending: Emergency Medicine | Admitting: Emergency Medicine

## 2020-12-12 ENCOUNTER — Emergency Department: Payer: Medicare Other

## 2020-12-12 ENCOUNTER — Other Ambulatory Visit: Payer: Self-pay

## 2020-12-12 ENCOUNTER — Encounter: Payer: Self-pay | Admitting: Emergency Medicine

## 2020-12-12 DIAGNOSIS — M79601 Pain in right arm: Secondary | ICD-10-CM | POA: Insufficient documentation

## 2020-12-12 DIAGNOSIS — Z79899 Other long term (current) drug therapy: Secondary | ICD-10-CM | POA: Diagnosis not present

## 2020-12-12 DIAGNOSIS — Z950 Presence of cardiac pacemaker: Secondary | ICD-10-CM | POA: Diagnosis not present

## 2020-12-12 DIAGNOSIS — E039 Hypothyroidism, unspecified: Secondary | ICD-10-CM | POA: Diagnosis not present

## 2020-12-12 DIAGNOSIS — I5033 Acute on chronic diastolic (congestive) heart failure: Secondary | ICD-10-CM | POA: Insufficient documentation

## 2020-12-12 DIAGNOSIS — Z87891 Personal history of nicotine dependence: Secondary | ICD-10-CM | POA: Diagnosis not present

## 2020-12-12 DIAGNOSIS — I110 Hypertensive heart disease with heart failure: Secondary | ICD-10-CM | POA: Insufficient documentation

## 2020-12-12 DIAGNOSIS — G8929 Other chronic pain: Secondary | ICD-10-CM | POA: Insufficient documentation

## 2020-12-12 DIAGNOSIS — Z7901 Long term (current) use of anticoagulants: Secondary | ICD-10-CM | POA: Insufficient documentation

## 2020-12-12 DIAGNOSIS — J449 Chronic obstructive pulmonary disease, unspecified: Secondary | ICD-10-CM | POA: Insufficient documentation

## 2020-12-12 DIAGNOSIS — I4891 Unspecified atrial fibrillation: Secondary | ICD-10-CM | POA: Diagnosis not present

## 2020-12-12 DIAGNOSIS — M25511 Pain in right shoulder: Secondary | ICD-10-CM | POA: Insufficient documentation

## 2020-12-12 MED ORDER — BACLOFEN 10 MG PO TABS
10.0000 mg | ORAL_TABLET | Freq: Once | ORAL | Status: AC
  Administered 2020-12-12: 10 mg via ORAL
  Filled 2020-12-12: qty 1

## 2020-12-12 MED ORDER — BACLOFEN 10 MG PO TABS: 10.0000 mg | ORAL_TABLET | Freq: Every day | ORAL | 0 refills | Status: AC

## 2020-12-12 MED ORDER — PREDNISONE 20 MG PO TABS: 20.0000 mg | ORAL_TABLET | Freq: Two times a day (BID) | ORAL | 0 refills | Status: AC

## 2020-12-12 MED ORDER — OXYCODONE-ACETAMINOPHEN 5-325 MG PO TABS
1.0000 | ORAL_TABLET | Freq: Once | ORAL | Status: AC
Start: 2020-12-12 — End: 2020-12-12
  Administered 2020-12-12: 1 via ORAL
  Filled 2020-12-12: qty 1

## 2020-12-12 NOTE — ED Notes (Signed)
This RN discussed pt's care with Dr. Kerman Passey, per Dr. Kerman Passey, no orders received and patient to go to flex.

## 2020-12-12 NOTE — ED Triage Notes (Signed)
Pt to ED via wheelchair from [facility] with c/o R arm that started with sudden onset at 0430. Pt states took 1/2 pain pill before 0500 today, took other 1/2 after 7am. Pt denies known injury at this time. Pt states is unable to lift R arm up at this time. Pt with noted R grip strength weakness at this time. Pt A&O x4, able to answer all questions, Pt able to move arm and use R hand while sitting in wheelchair. Pt c/o increasing pain when turning her head to the R side.

## 2020-12-12 NOTE — ED Triage Notes (Signed)
First RN Note: Pt to ED via POV with neck and R arm pain that radiates down to her fingers, per [facility] staff patient with no numbness/tingling, [place] staff reports patient was sent over for pain control.

## 2020-12-12 NOTE — ED Notes (Signed)
Pt has been provided with discharge instructions. Pt denies any questions or concerns at this time. Pt verbalizes understanding for follow up care and d/c.  VSS.  Pt left department with all belongings.  

## 2020-12-12 NOTE — ED Provider Notes (Signed)
[facility] Emergency Department Provider Note ____________________________________________  Time seen: 1339  I have reviewed the triage vital signs and the nursing notes.  HISTORY  Chief Complaint  Arm Pain  HPI [name] is a 75 y.o. female is presented to the ED from [facility], for evaluation of acute on chronic right shoulder pain.   Patient presents with right arm pain that started about 430 this morning.  She took one half of her daily prescribed oxycodone, at onset.  She took the other one half of a tablet at about 7 AM.  She denies any preceding injury, trauma, or fall.  Patient reports he is unable to raise her left arm at this time secondary to the pain.  She denies any distal paresthesias, grip changes, or peripheral edema.  Patient gets quarterly injections to both shoulders at [facility] for underlying osteoarthritis.  Past Medical History:  Diagnosis Date  . 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 s/p bioprosthetic valve replacement 2008 at [facility];  b. 01/2015 Echo: EF 60-65%, no rwma, Gr1 DD, mildly dil LA, nl RV fxn.  . Atrial fibrillation with RVR ([place]) 01/11/2015  . Atrial flutter ([place])    a. 08/2016 s/p DCCV.  Remains on flecainide 50 mg bid.  . Basal skull fracture ([place]) 20 yrs ago  . CHF (congestive heart failure) ([place])   . Chronic respiratory failure ([place])   . COPD (chronic obstructive pulmonary disease) ([place])    a. on home O2 at 2L since 2008  . 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 Essential hypertension 01/24/2015  . History of cardiac cath    a. 2008 prior to Aortic aneurysm repair-->nl cors.  . History of stress test    a. 10/2015 MV: no ischemia/infarct.  [name] HLD (hyperlipidemia)   . HTN (hypertension)   . Hypothyroidism   . Obesity   . PAF (paroxysmal atrial fibrillation) ([name])    a. on Eliquis; b. CHADS2VASc = 3 (HTN, age x 1, female).  . Paroxysmal atrial fibrillation ([place]) 01/24/2015  . Right  upper quadrant abdominal tenderness without rebound tenderness 03/02/2018  . S/P ascending aortic aneurysm repair 2008    Patient Active Problem List   Diagnosis Date Noted  . Acute on chronic diastolic CHF (congestive heart failure) ([place]) 05/11/2020  . COPD (chronic obstructive pulmonary disease) ([place])   . Hypothyroidism   . Acute non-recurrent frontal sinusitis 04/22/2020  . Vertigo 04/22/2020  . S/P placement of cardiac pacemaker 02/21/2020  . Episode of moderate major depression ([place]) 10/10/2019  . Persistent atrial fibrillation ([place])   . Encounter for general adult medical examination with abnormal findings 07/10/2019  . Encounter for screening mammogram for malignant neoplasm of breast 07/10/2019  . Atopic dermatitis 05/02/2019  . Paroxysmal atrial flutter ([place]) 08/11/2018  . Encounter for long-term (current) use of medications 07/09/2018  . Chronic left shoulder pain 07/09/2018  . Conjunctivitis 07/09/2018  . Oxygen dependent 07/09/2018  . GAD (generalized anxiety disorder) 07/09/2018  . Ovarian failure 07/09/2018  . Positive colorectal cancer screening using Cologuard test 04/24/2018  . Calculus of gallbladder with acute on chronic cholecystitis 04/08/2018  . H/O: CVA (cerebrovascular accident) 04/08/2018  . Dysuria 03/02/2018  . Urinary tract infection without hematuria 03/02/2018  . Right upper quadrant abdominal tenderness without rebound tenderness 03/02/2018  . 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 exacerbation ([place]) 03/02/2018  . Chronic obstructive pulmonary disease ([place]) 09/19/2017  . Typical  atrial flutter ([place])   . Irritable bowel syndrome without diarrhea 01/29/2015  . Essential hypertension 01/24/2015  . Paroxysmal atrial fibrillation ([place]) 01/24/2015  . History of aortic valve replacement with bioprosthetic valve 01/24/2015  . Atrial fibrillation with RVR ([place]) 01/11/2015  . 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  Impingement syndrome of shoulder 05/18/2014  . Cellulitis and abscess 02/13/2014  . MRSA (methicillin resistant Staphylococcus aureus) 02/13/2014  . Aneurysm, ascending aorta ([place]) 06/23/2013  . Bicuspid aortic valve 06/23/2013    Past Surgical History:  Procedure Laterality Date  . ABDOMINAL AORTIC ANEURYSM REPAIR  2008  . ABDOMINAL HYSTERECTOMY    . AORTIC VALVE REPLACEMENT  2008  . CARDIAC CATHETERIZATION     [facility]  . CARDIOVERSION N/A 08/15/2018   Procedure: CARDIOVERSION;  Surgeon: [name], MD;  Location: [facility] ORS;  Service: Cardiovascular;  Laterality: N/A;  . CARDIOVERSION N/A 11/14/2018   Procedure: CARDIOVERSION (CATH LAB);  Surgeon: [name], MD;  Location: [facility] ORS;  Service: Cardiovascular;  Laterality: N/A;  . CARDIOVERSION N/A 06/05/2019   Procedure: CARDIOVERSION;  Surgeon: [name], MD;  Location: [facility] ORS;  Service: Cardiovascular;  Laterality: N/A;  . CARDIOVERSION N/A 08/14/2019   Procedure: CARDIOVERSION;  Surgeon: [name], MD;  Location: [facility] ORS;  Service: Cardiovascular;  Laterality: N/A;  . CARDIOVERSION N/A 11/09/2019   Procedure: CARDIOVERSION;  Surgeon: Isaias Cowman, MD;  Location: [facility] ORS;  Service: Cardiovascular;  Laterality: N/A;  . CARDIOVERSION N/A 01/17/2020   Procedure: CARDIOVERSION;  Surgeon: Isaias Cowman, MD;  Location: [facility] ORS;  Service: Cardiovascular;  Laterality: N/A;  . CARPAL TUNNEL RELEASE    . ELECTROPHYSIOLOGIC STUDY N/A 08/17/2016   Procedure: Cardioversion;  Surgeon: [name], MD;  Location: [facility] ORS;  Service: Cardiovascular;  Laterality: N/A;  . TUMOR EXCISION Left    x3 (arm)    Prior to Admission medications   Medication Sig Start Date End Date Taking? Authorizing Provider  baclofen (LIORESAL) 10 MG tablet Take 1 tablet (10 mg total) by mouth daily. 12/12/20 01/11/21 Yes Ember Henrikson, Dannielle Karvonen, PA-C  predniSONE (DELTASONE) 20 MG tablet Take 1 tablet (20 mg total) by mouth 2  (two) times daily with a meal for 5 days. 12/13/20 12/18/20 Yes Katheleen Stella, Dannielle Karvonen, PA-C  acetaminophen (TYLENOL) 500 MG tablet Take 500 mg by mouth every 4 (four) hours as needed for moderate pain or headache.     [provider]  albuterol (PROVENTIL) (2.5 MG/3ML) 0.083% nebulizer solution Take 3 mLs (2.5 mg total) by nebulization every 6 (six) hours as needed for wheezing or shortness of breath. 04/15/20   Kendell Bane, NP  ALPRAZolam Duanne Moron) 0.25 MG tablet Take 1 tablet (0.25 mg total) by mouth 2 (two) times daily as needed for anxiety. 11/20/20   Luiz Ochoa, NP  atorvastatin (LIPITOR) 40 MG tablet 1 tab po qhs 08/15/20   [name], MD  bumetanide (BUMEX) 1 MG tablet Take 2 tablets (2 mg total) by mouth 2 (two) times daily. 08/20/20 09/19/20  Alisa Graff, FNP  Calcium Carbonate-Vitamin D (CALCIUM 600+D PO) Take 1 tablet by mouth daily.    [provider]  Coenzyme Q10 (COQ10) 200 MG CAPS Take 200 mg by mouth daily.    [provider]  ELIQUIS 5 MG TABS tablet Take 1 tablet by mouth twice daily. Patient taking differently: Take 5 mg by mouth 2 (two) times daily. 10/26/19   [name], MD  ferrous sulfate 325 (65 FE) MG tablet Take 650 mg by mouth daily with breakfast.     [provider]  flecainide (TAMBOCOR) 50 MG tablet Take 1 tablet (50 mg total) by mouth 2 (two) times daily. PLEASE CONTACT OFFICE [phone] TO SCHEDULE APPOINTMENT FOR FUTURE REFILLS 02/23/20   Deboraha Sprang, MD  levothyroxine (SYNTHROID) 25 MCG tablet Take 1 tablet (25 mcg total) by mouth daily before breakfast. 07/15/20   Ronnell Freshwater, NP  lisinopril (ZESTRIL) 40 MG tablet Take 1 tablet (40 mg total) by mouth daily. 10/07/20 01/05/21  Alisa Graff, FNP  mupirocin nasal ointment (BACTROBAN) 2 % Use one-half of tube in each nostril twice daily for five (5) days. After application, press sides of nose together and gently massage. 08/20/20   [name], MD   oxyCODONE-acetaminophen [facility]) 7.5-325 MG tablet Take half to one tab po qd prn for pain 11/07/20   [name], MD  pantoprazole (PROTONIX) 40 MG tablet Take 1 tablet (40 mg total) by mouth daily. 07/15/20   Ronnell Freshwater, NP  potassium chloride (KLOR-CON) 10 MEQ tablet Take 4 tablets (40 mEq total) by mouth 2 (two) times daily. 11/28/20   [name], MD  sertraline (ZOLOFT) 50 MG tablet Take 1 tablet (50 mg total) by mouth daily. 12/04/20   Luiz Ochoa, NP  traZODone (DESYREL) 50 MG tablet Take 1 tablet by mouth at bedtime. 11/19/20   Luiz Ochoa, NP  TRELEGY ELLIPTA 100-62.5-25 MCG/INH AEPB INHALE 1 PUFF INTO THE LUNGS DAILY 10/09/20   [name], MD    Allergies Benadryl [diphenhydramine hcl (sleep)], Cetirizine, Lasix [furosemide], Levaquin [levofloxacin in d5w], Meloxicam, Soy allergy, and Sulfa antibiotics  Family History  Problem Relation Age of Onset  . Stroke Father   . Stroke Paternal Grandmother     Social History Social History   Tobacco Use  . Smoking status: Former Smoker    Types: Cigarettes  . Smokeless tobacco: Never Used  Vaping Use  . Vaping Use: Never used  Substance Use Topics  . Alcohol use: No  . Drug use: No    Review of Systems  Constitutional: Negative for fever. Cardiovascular: Negative for chest pain. Respiratory: Negative for shortness of breath. Gastrointestinal: Negative for abdominal pain, vomiting and diarrhea. Genitourinary: Negative for dysuria. Musculoskeletal: Negative for back pain. Right shoulder pain as above Skin: Negative for rash. Neurological: Negative for headaches, focal weakness or numbness. ____________________________________________  PHYSICAL EXAM:  VITAL SIGNS: ED Triage Vitals  Enc Vitals Group     BP 12/12/20 1243 (!) 147/68     Pulse Rate 12/12/20 1243 73     Resp 12/12/20 1243 (!) 22     Temp 12/12/20 1243 98.5 F (36.9 C)     Temp Source 12/12/20 1243 Oral     SpO2 12/12/20 1243 100 %      Weight 12/12/20 1245 175 lb 14.8 oz (79.8 kg)     Height 12/12/20 1245 4\' 10"  (1.473 m)     Head Circumference --      Peak Flow --      Pain Score 12/12/20 1626 7     Pain Loc --      Pain Edu? --      Excl. in [place]? --     Constitutional: Alert and oriented. Well appearing and in no distress. Head: Normocephalic and atraumatic. Eyes: Conjunctivae are normal. Normal extraocular movements Neck: Supple. Normal ROM.  No crepitus or midline  tenderness Cardiovascular: Normal rate, regular rhythm. Normal distal pulses. Respiratory: Normal respiratory effort. No wheezes/rales/rhonchi. Gastrointestinal: Soft and nontender. No distention. Musculoskeletal: No obvious deformity or dislocation to the right shoulder.  No sulcus sign is appreciated.  Patient with limited active range of motion secondary to subjective complaints of pain.  Passively unable to demonstrate normal rotation of the shoulder.  In normal extension to at least 90 degrees.  Patient with normal composite fist distally.  Nontender with normal range of motion in all extremities.  Neurologic: Cranial nerves II to XII grossly intact.  Normal UV DTRs bilaterally. Normal speech and language. No gross focal neurologic deficits are appreciated. Skin:  Skin is warm, dry and intact. No rash noted. Psychiatric: Mood and affect are normal. Patient exhibits appropriate insight and judgment. ____________________________________________   RADIOLOGY  DG Right Shoulder  IMPRESSION: Progressive degenerative changes at RIGHT glenohumeral and RIGHT acromioclavicular joints.  I, Melvenia Needles, personally viewed and evaluated these images (plain radiographs) as part of my medical decision making, as well as reviewing the written report by the radiologist. ____________________________________________  PROCEDURES  Percocet 5-325 mg PO Baclofen 10 mg PO  Procedures ____________________________________________  INITIAL IMPRESSION /  ASSESSMENT AND PLAN / ED COURSE  DDX: calcific tendinitis, DJD, adhesive capsulitis  Patient ED evaluation of acute on chronic right shoulder pain without preceding injury or trauma.  Patient clinical picture is concerning for possible tendinitis but chronic pain is also overshadowing this.  No radiologic evidence of any acute fracture or dislocation.  No significant bone changes seen on x-ray.  Patient reports improvement of her pain and range of motion after medication administration.  She will be discharged with a prescription for muscle relaxant to take as well as a prednisone course.  She will follow-up with her primary provider for evaluation for repeat injection to the shoulder and further management of her symptoms.  Return precautions have been discussed.   LAQUETTA RACEY was evaluated in Emergency Department on 12/13/2020 for the symptoms described in the history of present illness. She was evaluated in the context of the global COVID-19 pandemic, which necessitated consideration that the patient might be at risk for infection with the SARS-CoV-2 virus that causes COVID-19. Institutional protocols and algorithms that pertain to the evaluation of patients at risk for COVID-19 are in a state of rapid change based on information released by regulatory bodies including the CDC and federal and state organizations. These policies and algorithms were followed during the patient's care in the ED. ____________________________________________  FINAL CLINICAL IMPRESSION(S) / ED DIAGNOSES  Final diagnoses:  Chronic right shoulder pain      Carmie End, Dannielle Karvonen, PA-C 12/13/20 1719    Harvest Dark, MD 12/13/20 2025

## 2020-12-12 NOTE — Discharge Instructions (Addendum)
Your x-ray shows some moderate arthritis in the joint.  Take the muscle relaxant as needed, and your pain medicine as directed.  Follow-up with [name], before you start the steroid, as discussed.

## 2020-12-18 DIAGNOSIS — Q231 Congenital insufficiency of aortic valve: Secondary | ICD-10-CM | POA: Diagnosis not present

## 2020-12-18 DIAGNOSIS — I4891 Unspecified atrial fibrillation: Secondary | ICD-10-CM | POA: Diagnosis not present

## 2020-12-18 DIAGNOSIS — Z953 Presence of xenogenic heart valve: Secondary | ICD-10-CM | POA: Diagnosis not present

## 2020-12-18 DIAGNOSIS — Z950 Presence of cardiac pacemaker: Secondary | ICD-10-CM | POA: Diagnosis not present

## 2020-12-19 ENCOUNTER — Telehealth: Payer: Self-pay

## 2020-12-19 ENCOUNTER — Other Ambulatory Visit: Payer: Self-pay | Admitting: Hospice and Palliative Medicine

## 2020-12-19 NOTE — Telephone Encounter (Signed)
Pt called requesting a refill on alprazolam 0.25 stating she will run out before Monday.  Per Lovena Le pt was given 60 tablet with 1 refill on 11/20/20.  I called pt and LMOM that she needs to contact pharmacy bc she has 1 refill left

## 2020-12-23 ENCOUNTER — Other Ambulatory Visit: Payer: Self-pay | Admitting: Hospice and Palliative Medicine

## 2020-12-23 MED ORDER — OXYCODONE-ACETAMINOPHEN 7.5-325 MG PO TABS: ORAL_TABLET | ORAL | 0 refills | Status: DC

## 2020-12-24 ENCOUNTER — Other Ambulatory Visit: Payer: Self-pay

## 2020-12-24 ENCOUNTER — Telehealth: Payer: Self-pay

## 2020-12-24 ENCOUNTER — Other Ambulatory Visit: Payer: Self-pay | Admitting: Hospice and Palliative Medicine

## 2020-12-24 DIAGNOSIS — J449 Chronic obstructive pulmonary disease, unspecified: Secondary | ICD-10-CM

## 2020-12-24 MED ORDER — PANTOPRAZOLE SODIUM 40 MG PO TBEC: 40.0000 mg | DELAYED_RELEASE_TABLET | Freq: Every day | ORAL | 1 refills | Status: DC

## 2020-12-24 MED ORDER — LEVOTHYROXINE SODIUM 25 MCG PO TABS: 25.0000 ug | ORAL_TABLET | Freq: Every day | ORAL | 1 refills | Status: DC

## 2020-12-24 NOTE — Telephone Encounter (Signed)
Gave verbal order to Occupational therapist Colletta [place] [phone]) for 2 times a week for 4 weeks

## 2020-12-25 DIAGNOSIS — G4733 Obstructive sleep apnea (adult) (pediatric): Secondary | ICD-10-CM | POA: Diagnosis not present

## 2020-12-26 ENCOUNTER — Encounter: Payer: Self-pay | Admitting: Hospice and Palliative Medicine

## 2020-12-26 ENCOUNTER — Other Ambulatory Visit: Payer: Self-pay

## 2020-12-26 ENCOUNTER — Ambulatory Visit: Payer: Medicare Other | Admitting: Hospice and Palliative Medicine

## 2020-12-26 VITALS — BP 140/80 | HR 83 | Temp 98.8°F | Resp 16 | Ht <= 58 in | Wt 182.0 lb

## 2020-12-26 DIAGNOSIS — J9611 Chronic respiratory failure with hypoxia: Secondary | ICD-10-CM

## 2020-12-26 DIAGNOSIS — I480 Paroxysmal atrial fibrillation: Secondary | ICD-10-CM

## 2020-12-26 DIAGNOSIS — M25511 Pain in right shoulder: Secondary | ICD-10-CM | POA: Diagnosis not present

## 2020-12-26 DIAGNOSIS — G8929 Other chronic pain: Secondary | ICD-10-CM

## 2020-12-26 NOTE — Progress Notes (Signed)
[facility] [place], [place] [address]  Internal MEDICINE  Office Visit Note  Patient Name: [name]  [id number]  [id number]  Date of Service: 12/26/2020  Chief Complaint  Patient presents with  . Congestive Heart Failure  . COPD  . Hypertension    HPI Patient is here for routine follow-up Reveiwed recent labs--continue taking Vitmain D supplement, all other labs stable Followed by cardiology for CHF Now getting occupational therpay within her home--helping with shoulder exercises and getting her aide devices to help with getting dressed Wanting to stop seeing her ortho doctor for shoulder pain--gives her steroid injections that only help relieve her pain for a few weeks and then returns, aware that she needs shoulder replacement surgery but due to multiple underyling comorbid conditions she is not a candidate for surgery  Current Medication: Outpatient Encounter Medications as of 12/26/2020  Medication Sig  . acetaminophen (TYLENOL) 500 MG tablet Take 500 mg by mouth every 4 (four) hours as needed for moderate pain or headache.   . albuterol (PROVENTIL) (2.5 MG/3ML) 0.083% nebulizer solution Take 3 mLs (2.5 mg total) by nebulization every 6 (six) hours as needed for wheezing or shortness of breath.  . ALPRAZolam (XANAX) 0.25 MG tablet Take 1 tablet (0.25 mg total) by mouth 2 (two) times daily as needed for anxiety.  [name] atorvastatin (LIPITOR) 40 MG tablet 1 tab po qhs  . baclofen (LIORESAL) 10 MG tablet Take 1 tablet (10 mg total) by mouth daily.  . bumetanide (BUMEX) 1 MG tablet Take 2 tablets (2 mg total) by mouth 2 (two) times daily.  . Calcium Carbonate-Vitamin D (CALCIUM 600+D PO) Take 1 tablet by mouth daily.  . Coenzyme Q10 (COQ10) 200 MG CAPS Take 200 mg by mouth daily.  [name] ELIQUIS 5 MG TABS tablet Take 1 tablet by mouth twice daily. (Patient taking differently: Take 5 mg by mouth 2 (two) times daily.)  . ferrous sulfate 325 (65 FE) MG tablet  Take 650 mg by mouth daily with breakfast.   . flecainide (TAMBOCOR) 50 MG tablet Take 1 tablet (50 mg total) by mouth 2 (two) times daily. PLEASE CONTACT OFFICE [phone] TO SCHEDULE APPOINTMENT FOR FUTURE REFILLS  . levothyroxine (SYNTHROID) 25 MCG tablet Take 1 tablet (25 mcg total) by mouth daily before breakfast.  . lisinopril (ZESTRIL) 40 MG tablet Take 1 tablet (40 mg total) by mouth daily.  . mupirocin nasal ointment (BACTROBAN) 2 % Use one-half of tube in each nostril twice daily for five (5) days. After application, press sides of nose together and gently massage.  [name] oxyCODONE-acetaminophen (PERCOCET) 7.5-325 MG tablet Take half to one tab po qd prn for pain  . pantoprazole (PROTONIX) 40 MG tablet Take 1 tablet (40 mg total) by mouth daily.  . potassium chloride (KLOR-CON) 10 MEQ tablet Take 4 tablets (40 mEq total) by mouth 2 (two) times daily.  . sertraline (ZOLOFT) 50 MG tablet Take 1 tablet (50 mg total) by mouth daily.  . traZODone (DESYREL) 50 MG tablet Take 1 tablet by mouth at bedtime.  . TRELEGY ELLIPTA 100-62.5-25 MCG/INH AEPB Inhale 1 puff by mouth into the lungs daily.   No facility-administered encounter medications on file as of 12/26/2020.    Surgical History: Past Surgical History:  Procedure Laterality Date  . ABDOMINAL AORTIC ANEURYSM REPAIR  2008  . ABDOMINAL HYSTERECTOMY    . AORTIC VALVE REPLACEMENT  2008  . CARDIAC CATHETERIZATION     [facility]  . CARDIOVERSION N/A  08/15/2018   Procedure: CARDIOVERSION;  Surgeon: [name], MD;  Location: [facility] ORS;  Service: Cardiovascular;  Laterality: N/A;  . CARDIOVERSION N/A 11/14/2018   Procedure: CARDIOVERSION (CATH LAB);  Surgeon: [name], MD;  Location: [facility] ORS;  Service: Cardiovascular;  Laterality: N/A;  . CARDIOVERSION N/A 06/05/2019   Procedure: CARDIOVERSION;  Surgeon: [name], MD;  Location: [facility] ORS;  Service: Cardiovascular;  Laterality: N/A;  . CARDIOVERSION N/A 08/14/2019    Procedure: CARDIOVERSION;  Surgeon: [name], MD;  Location: [facility] ORS;  Service: Cardiovascular;  Laterality: N/A;  . CARDIOVERSION N/A 11/09/2019   Procedure: CARDIOVERSION;  Surgeon: Isaias Cowman, MD;  Location: [facility] ORS;  Service: Cardiovascular;  Laterality: N/A;  . CARDIOVERSION N/A 01/17/2020   Procedure: CARDIOVERSION;  Surgeon: Isaias Cowman, MD;  Location: [facility] ORS;  Service: Cardiovascular;  Laterality: N/A;  . CARPAL TUNNEL RELEASE    . ELECTROPHYSIOLOGIC STUDY N/A 08/17/2016   Procedure: Cardioversion;  Surgeon: [name], MD;  Location: [facility] ORS;  Service: Cardiovascular;  Laterality: N/A;  . TUMOR EXCISION Left    x3 (arm)    Medical History: Past Medical History:  Diagnosis Date  . 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 s/p bioprosthetic valve replacement 2008 at [facility];  b. 01/2015 Echo: EF 60-65%, no rwma, Gr1 DD, mildly dil LA, nl RV fxn.  . Atrial fibrillation with RVR ([place]) 01/11/2015  . Atrial flutter ([place])    a. 08/2016 s/p DCCV.  Remains on flecainide 50 mg bid.  . Basal skull fracture ([place]) 20 yrs ago  . CHF (congestive heart failure) ([place])   . Chronic respiratory failure ([place])   . COPD (chronic obstructive pulmonary disease) ([place])    a. on home O2 at 2L since 2008  . 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 Essential hypertension 01/24/2015  . History of cardiac cath    a. 2008 prior to Aortic aneurysm repair-->nl cors.  . History of stress test    a. 10/2015 MV: no ischemia/infarct.  [name] HLD (hyperlipidemia)   . HTN (hypertension)   . Hypothyroidism   . Obesity   . PAF (paroxysmal atrial fibrillation) ([name])    a. on Eliquis; b. CHADS2VASc = 3 (HTN, age x 1, female).  . Paroxysmal atrial fibrillation ([place]) 01/24/2015  . Right upper quadrant abdominal tenderness without rebound tenderness 03/02/2018  . S/P ascending aortic aneurysm repair 2008    Family History: Family History  Problem Relation Age  of Onset  . Stroke Father   . Stroke Paternal Grandmother     Social History   Socioeconomic History  . Marital status: Single    Spouse name: Not on file  . Number of children: Not on file  . Years of education: Not on file  . Highest education level: Not on file  Occupational History  . Not on file  Tobacco Use  . Smoking status: Former Smoker    Types: Cigarettes  . Smokeless tobacco: Never Used  Vaping Use  . Vaping Use: Never used  Substance and Sexual Activity  . Alcohol use: No  . Drug use: No  . Sexual activity: Never    Birth control/protection: Surgical  Other Topics Concern  . Not on file  Social History Narrative  . Not on file   Social Determinants of Health   Financial Resource Strain: Not on file  Food Insecurity: Not on file  Transportation Needs:  Not on file  Physical Activity: Not on file  Stress: Not on file  Social Connections: Not on file  Intimate Partner Violence: Not on file    Review of Systems  Constitutional: Negative for chills, diaphoresis and fatigue.  HENT: Negative for ear pain, postnasal drip and sinus pressure.   Eyes: Negative for photophobia, discharge, redness, itching and visual disturbance.  Respiratory: Negative for cough, shortness of breath and wheezing.   Cardiovascular: Negative for chest pain, palpitations and leg swelling.  Gastrointestinal: Negative for abdominal pain, constipation, diarrhea, nausea and vomiting.  Genitourinary: Negative for dysuria and flank pain.  Musculoskeletal: Positive for arthralgias and back pain. Negative for gait problem and neck pain.  Skin: Negative for color change.  Allergic/Immunologic: Negative for environmental allergies and food allergies.  Neurological: Negative for dizziness and headaches.  Hematological: Does not bruise/bleed easily.  Psychiatric/Behavioral: Negative for agitation, behavioral problems (depression) and hallucinations.    Vital Signs: BP 140/80   Pulse 83    Temp 98.8 F (37.1 C)   Resp 16   Ht 4\' 10"  (1.473 m)   Wt 182 lb (82.6 kg)   SpO2 90%   BMI 38.04 kg/m    Physical Exam Vitals reviewed.  Constitutional:      Appearance: Normal appearance. She is obese.  Cardiovascular:     Rate and Rhythm: Normal rate and regular rhythm.     Pulses: Normal pulses.     Heart sounds: Normal heart sounds.  Pulmonary:     Effort: Pulmonary effort is normal.     Breath sounds: Normal breath sounds.  Abdominal:     General: Abdomen is flat.     Palpations: Abdomen is soft.  Musculoskeletal:        General: Normal range of motion.     Cervical back: Normal range of motion.  Skin:    General: Skin is warm.  Neurological:     General: No focal deficit present.     Mental Status: She is alert and oriented to person, place, and time. Mental status is at baseline.  Psychiatric:        Mood and Affect: Mood normal.        Behavior: Behavior normal.        Thought Content: Thought content normal.        Judgment: Judgment normal.    Assessment/Plan: 1. Chronic respiratory failure with hypoxia ([name]) Continue with all supportive measures at this time, followed by pulmonology  2. PAF (paroxysmal atrial fibrillation) ([place]) Rate controlled and on anticoagulation--followed by cardiology  3. Chronic right shoulder pain Continue with PT and OT--followed by ortho, advised to discuss alternative treatment options  General Counseling: [name] verbalizes understanding of the findings of todays visit and agrees with plan of treatment. I have discussed any further diagnostic evaluation that may be needed or ordered today. We also reviewed her medications today. she has been encouraged to call the office with any questions or concerns that should arise related to todays visit.   Time spent:30 Minutes Time spent includes review of chart, medications, test results and follow-up plan with the patient.  This patient was seen by Theodoro Grist AGNP-C in  Collaboration with Dr [name] as a part of collaborative care agreement     Tanna Furry. Leon Montoya AGNP-C Internal medicine

## 2020-12-31 DIAGNOSIS — J449 Chronic obstructive pulmonary disease, unspecified: Secondary | ICD-10-CM | POA: Diagnosis not present

## 2021-01-02 DIAGNOSIS — H9192 Unspecified hearing loss, left ear: Secondary | ICD-10-CM | POA: Diagnosis not present

## 2021-01-02 DIAGNOSIS — J449 Chronic obstructive pulmonary disease, unspecified: Secondary | ICD-10-CM | POA: Diagnosis not present

## 2021-01-02 DIAGNOSIS — F411 Generalized anxiety disorder: Secondary | ICD-10-CM | POA: Diagnosis not present

## 2021-01-02 DIAGNOSIS — F321 Major depressive disorder, single episode, moderate: Secondary | ICD-10-CM | POA: Diagnosis not present

## 2021-01-02 DIAGNOSIS — Z6837 Body mass index (BMI) 37.0-37.9, adult: Secondary | ICD-10-CM | POA: Diagnosis not present

## 2021-01-02 DIAGNOSIS — E039 Hypothyroidism, unspecified: Secondary | ICD-10-CM | POA: Diagnosis not present

## 2021-01-02 DIAGNOSIS — Z950 Presence of cardiac pacemaker: Secondary | ICD-10-CM | POA: Diagnosis not present

## 2021-01-02 DIAGNOSIS — I4819 Other persistent atrial fibrillation: Secondary | ICD-10-CM | POA: Diagnosis not present

## 2021-01-02 DIAGNOSIS — Z9981 Dependence on supplemental oxygen: Secondary | ICD-10-CM | POA: Diagnosis not present

## 2021-01-02 DIAGNOSIS — Z952 Presence of prosthetic heart valve: Secondary | ICD-10-CM | POA: Diagnosis not present

## 2021-01-02 DIAGNOSIS — I5033 Acute on chronic diastolic (congestive) heart failure: Secondary | ICD-10-CM | POA: Diagnosis not present

## 2021-01-02 DIAGNOSIS — E669 Obesity, unspecified: Secondary | ICD-10-CM | POA: Diagnosis not present

## 2021-01-02 DIAGNOSIS — K589 Irritable bowel syndrome without diarrhea: Secondary | ICD-10-CM | POA: Diagnosis not present

## 2021-01-02 DIAGNOSIS — M5032 Other cervical disc degeneration, mid-cervical region, unspecified level: Secondary | ICD-10-CM | POA: Diagnosis not present

## 2021-01-02 DIAGNOSIS — I110 Hypertensive heart disease with heart failure: Secondary | ICD-10-CM | POA: Diagnosis not present

## 2021-01-02 DIAGNOSIS — J9611 Chronic respiratory failure with hypoxia: Secondary | ICD-10-CM | POA: Diagnosis not present

## 2021-01-03 ENCOUNTER — Encounter: Payer: Self-pay | Admitting: Hospice and Palliative Medicine

## 2021-01-03 DIAGNOSIS — M19011 Primary osteoarthritis, right shoulder: Secondary | ICD-10-CM | POA: Diagnosis not present

## 2021-01-03 DIAGNOSIS — M19012 Primary osteoarthritis, left shoulder: Secondary | ICD-10-CM | POA: Diagnosis not present

## 2021-01-20 ENCOUNTER — Other Ambulatory Visit: Payer: Self-pay

## 2021-01-20 ENCOUNTER — Encounter: Payer: Self-pay | Admitting: Nurse Practitioner

## 2021-01-20 ENCOUNTER — Ambulatory Visit: Payer: Medicare Other | Admitting: Nurse Practitioner

## 2021-01-20 DIAGNOSIS — R197 Diarrhea, unspecified: Secondary | ICD-10-CM | POA: Diagnosis not present

## 2021-01-20 DIAGNOSIS — F411 Generalized anxiety disorder: Secondary | ICD-10-CM

## 2021-01-20 MED ORDER — PSYLLIUM 58.6 % PO PACK: 1.0000 | PACK | Freq: Every day | ORAL | 2 refills | Status: DC

## 2021-01-20 MED ORDER — ALPRAZOLAM 0.25 MG PO TABS: 0.2500 mg | ORAL_TABLET | Freq: Two times a day (BID) | ORAL | 1 refills | Status: DC | PRN

## 2021-01-20 NOTE — Progress Notes (Signed)
[facility] [place], [place] [address]  Internal MEDICINE  Office Visit Note  Patient Name: [name]  [id number]  [id number]  Date of Service: 01/20/2021  Chief Complaint  Patient presents with  . Acute Visit  . Diarrhea    Going on for  3 weeks   . Medication Refill    Xanax      HPI Pt is here for a sick visit. Loose stools x3 weeks often happens in the morning, might skip a day, does not matter what she eats. Denies any blood in stools, no abdominal pain or cramping.reports bloating and gas. Dos not know when she needs to go it just comes out.  No recent antibiotic use Eating waffles, meatloaf, boiled potatoes, crackers, grits with butter, not sure what else.  Still has her gallbladder, history of cholecystitis, IBS with constipation.  1 watery stool per day. Watery and loose.   Current Medication:  Outpatient Encounter Medications as of 01/20/2021  Medication Sig  . acetaminophen (TYLENOL) 500 MG tablet Take 500 mg by mouth every 4 (four) hours as needed for moderate pain or headache.   . albuterol (PROVENTIL) (2.5 MG/3ML) 0.083% nebulizer solution Take 3 mLs (2.5 mg total) by nebulization every 6 (six) hours as needed for wheezing or shortness of breath.  [name] atorvastatin (LIPITOR) 40 MG tablet 1 tab po qhs  . Calcium Carbonate-Vitamin D (CALCIUM 600+D PO) Take 1 tablet by mouth daily.  . Coenzyme Q10 (COQ10) 200 MG CAPS Take 200 mg by mouth daily.  [name] ELIQUIS 5 MG TABS tablet Take 1 tablet by mouth twice daily. (Patient taking differently: Take 5 mg by mouth 2 (two) times daily.)  . ferrous sulfate 325 (65 FE) MG tablet Take 650 mg by mouth daily with breakfast.   . flecainide (TAMBOCOR) 50 MG tablet Take 1 tablet (50 mg total) by mouth 2 (two) times daily. PLEASE CONTACT OFFICE [phone] TO SCHEDULE APPOINTMENT FOR FUTURE REFILLS  . levothyroxine (SYNTHROID) 25 MCG tablet Take 1 tablet (25 mcg total) by mouth daily before breakfast.  .  mupirocin nasal ointment (BACTROBAN) 2 % Use one-half of tube in each nostril twice daily for five (5) days. After application, press sides of nose together and gently massage.  [name] oxyCODONE-acetaminophen (PERCOCET) 7.5-325 MG tablet Take half to one tab po qd prn for pain  . pantoprazole (PROTONIX) 40 MG tablet Take 1 tablet (40 mg total) by mouth daily.  . potassium chloride (KLOR-CON) 10 MEQ tablet Take 4 tablets (40 mEq total) by mouth 2 (two) times daily.  . psyllium (METAMUCIL) 58.6 % packet Take 1 packet by mouth daily. Mix in water or juice.  . sertraline (ZOLOFT) 50 MG tablet Take 1 tablet (50 mg total) by mouth daily.  . traZODone (DESYREL) 50 MG tablet Take 1 tablet by mouth at bedtime.  . TRELEGY ELLIPTA 100-62.5-25 MCG/INH AEPB Inhale 1 puff by mouth into the lungs daily.  . [DISCONTINUED] ALPRAZolam (XANAX) 0.25 MG tablet Take 1 tablet (0.25 mg total) by mouth 2 (two) times daily as needed for anxiety.  . ALPRAZolam (XANAX) 0.25 MG tablet Take 1 tablet (0.25 mg total) by mouth 2 (two) times daily as needed for anxiety.  . bumetanide (BUMEX) 1 MG tablet Take 2 tablets (2 mg total) by mouth 2 (two) times daily.  [name] lisinopril (ZESTRIL) 40 MG tablet Take 1 tablet (40 mg total) by mouth daily.   No facility-administered encounter medications on file as of 01/20/2021.  Medical History: Past Medical History:  Diagnosis Date  . 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 s/p bioprosthetic valve replacement 2008 at [facility];  b. 01/2015 Echo: EF 60-65%, no rwma, Gr1 DD, mildly dil LA, nl RV fxn.  . Atrial fibrillation with RVR ([place]) 01/11/2015  . Atrial flutter ([place])    a. 08/2016 s/p DCCV.  Remains on flecainide 50 mg bid.  . Basal skull fracture ([place]) 20 yrs ago  . CHF (congestive heart failure) ([place])   . Chronic respiratory failure ([place])   . COPD (chronic obstructive pulmonary disease) ([place])    a. on home O2 at 2L since 2008  . 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 Essential hypertension 01/24/2015  . History of cardiac cath    a. 2008 prior to Aortic aneurysm repair-->nl cors.  . History of stress test    a. 10/2015 MV: no ischemia/infarct.  [name] HLD (hyperlipidemia)   . HTN (hypertension)   . Hypothyroidism   . Obesity   . PAF (paroxysmal atrial fibrillation) ([name])    a. on Eliquis; b. CHADS2VASc = 3 (HTN, age x 1, female).  . Paroxysmal atrial fibrillation ([place]) 01/24/2015  . Right upper quadrant abdominal tenderness without rebound tenderness 03/02/2018  . S/P ascending aortic aneurysm repair 2008     Vital Signs: BP 140/71   Pulse 84   Temp 97.8 F (36.6 C)   Resp 16   Ht 4\' 10"  (1.473 m)   Wt 180 lb (81.6 kg)   SpO2 96% Comment: 2 litre  BMI 37.62 kg/m    Review of Systems  Constitutional: Negative for chills, fatigue and fever.  Respiratory: Negative.   Cardiovascular: Negative.   Gastrointestinal: Positive for diarrhea. Negative for abdominal pain, blood in stool, constipation, nausea and vomiting.  Genitourinary: Negative for dysuria, flank pain, frequency, pelvic pain and urgency.  Psychiatric/Behavioral: Negative.     Physical Exam Constitutional:      Appearance: She is obese.  HENT:     Head: Normocephalic and atraumatic.  Cardiovascular:     Rate and Rhythm: Normal rate and regular rhythm.     Pulses: Normal pulses.     Heart sounds: Normal heart sounds.  Pulmonary:     Effort: Pulmonary effort is normal.     Breath sounds: Normal breath sounds.  Abdominal:     General: Bowel sounds are normal. There is no distension.     Palpations: Abdomen is soft. There is no mass.     Tenderness: There is no abdominal tenderness. There is no right CVA tenderness, left CVA tenderness, guarding or rebound.     Hernia: No hernia is present.  Skin:    General: Skin is warm and dry.  Neurological:     Mental Status: She is alert and oriented to person, place, and time.  Psychiatric:        Mood and Affect: Mood  normal.        Behavior: Behavior normal.       Assessment/Plan: 1. Diarrhea, unspecified type Fiber supplement ordered improve diarrhea. Flat plate abdominal xray to rule out any fecal impaction or other etiology. May refer to GI depending on xray results.  - psyllium (METAMUCIL) 58.6 % packet; Take 1 packet by mouth daily. Mix in water or juice.  Dispense: 30 packet; Refill: 2 - DG Abd 1 View; Future  2. GAD (generalized anxiety disorder) History of GAD, takes alprazolam  2 times daily as needed. Requesting refill, refill ordered.  - ALPRAZolam (XANAX) 0.25 MG tablet; Take 1 tablet (0.25 mg total) by mouth 2 (two) times daily as needed for anxiety.  Dispense: 60 tablet; Refill: 1   General Counseling: Milayah verbalizes understanding of the findings of todays visit and agrees with plan of treatment. I have discussed any further diagnostic evaluation that may be needed or ordered today. We also reviewed her medications today. she has been encouraged to call the office with any questions or concerns that should arise related to todays visit.    Counseling:    Orders Placed This Encounter  Procedures  . DG Abd 1 View    Meds ordered this encounter  Medications  . psyllium (METAMUCIL) 58.6 % packet    Sig: Take 1 packet by mouth daily. Mix in water or juice.    Dispense:  30 packet    Refill:  2  . ALPRAZolam (XANAX) 0.25 MG tablet    Sig: Take 1 tablet (0.25 mg total) by mouth 2 (two) times daily as needed for anxiety.    Dispense:  60 tablet    Refill:  1   Return if symptoms worsen or fail to improve.   [place] Controlled Substance Database was reviewed by me.  Time spent: 30 Minutes  This patient was seen by Jonetta Osgood, FNP-C in collaboration with Dr. Clayborn Bigness as a part of collaborative care agreement.

## 2021-01-29 ENCOUNTER — Telehealth: Payer: Self-pay

## 2021-01-29 ENCOUNTER — Emergency Department: Payer: Medicare Other

## 2021-01-29 ENCOUNTER — Encounter: Payer: Self-pay | Admitting: Internal Medicine

## 2021-01-29 ENCOUNTER — Observation Stay
Admission: EM | Admit: 2021-01-29 | Discharge: 2021-01-30 | Disposition: A | Discharge status: Home / Self Care | Payer: Medicare Other | Attending: Internal Medicine | Admitting: Internal Medicine

## 2021-01-29 ENCOUNTER — Other Ambulatory Visit: Payer: Self-pay

## 2021-01-29 DIAGNOSIS — I110 Hypertensive heart disease with heart failure: Secondary | ICD-10-CM | POA: Insufficient documentation

## 2021-01-29 DIAGNOSIS — I4819 Other persistent atrial fibrillation: Secondary | ICD-10-CM | POA: Diagnosis not present

## 2021-01-29 DIAGNOSIS — Z8673 Personal history of transient ischemic attack (TIA), and cerebral infarction without residual deficits: Secondary | ICD-10-CM

## 2021-01-29 DIAGNOSIS — G319 Degenerative disease of nervous system, unspecified: Secondary | ICD-10-CM | POA: Diagnosis not present

## 2021-01-29 DIAGNOSIS — J449 Chronic obstructive pulmonary disease, unspecified: Secondary | ICD-10-CM | POA: Insufficient documentation

## 2021-01-29 DIAGNOSIS — M25519 Pain in unspecified shoulder: Secondary | ICD-10-CM | POA: Diagnosis not present

## 2021-01-29 DIAGNOSIS — K922 Gastrointestinal hemorrhage, unspecified: Secondary | ICD-10-CM

## 2021-01-29 DIAGNOSIS — R42 Dizziness and giddiness: Secondary | ICD-10-CM | POA: Diagnosis present

## 2021-01-29 DIAGNOSIS — D649 Anemia, unspecified: Secondary | ICD-10-CM | POA: Diagnosis not present

## 2021-01-29 DIAGNOSIS — Z87891 Personal history of nicotine dependence: Secondary | ICD-10-CM | POA: Diagnosis not present

## 2021-01-29 DIAGNOSIS — Z7901 Long term (current) use of anticoagulants: Secondary | ICD-10-CM | POA: Insufficient documentation

## 2021-01-29 DIAGNOSIS — Z79899 Other long term (current) drug therapy: Secondary | ICD-10-CM | POA: Diagnosis not present

## 2021-01-29 DIAGNOSIS — I959 Hypotension, unspecified: Secondary | ICD-10-CM | POA: Diagnosis not present

## 2021-01-29 DIAGNOSIS — F411 Generalized anxiety disorder: Secondary | ICD-10-CM | POA: Diagnosis not present

## 2021-01-29 DIAGNOSIS — Z950 Presence of cardiac pacemaker: Secondary | ICD-10-CM | POA: Diagnosis present

## 2021-01-29 DIAGNOSIS — Z20822 Contact with and (suspected) exposure to covid-19: Secondary | ICD-10-CM | POA: Insufficient documentation

## 2021-01-29 DIAGNOSIS — I5033 Acute on chronic diastolic (congestive) heart failure: Secondary | ICD-10-CM | POA: Insufficient documentation

## 2021-01-29 DIAGNOSIS — R4702 Dysphasia: Secondary | ICD-10-CM | POA: Diagnosis not present

## 2021-01-29 DIAGNOSIS — E039 Hypothyroidism, unspecified: Secondary | ICD-10-CM | POA: Insufficient documentation

## 2021-01-29 DIAGNOSIS — R000 Tachycardia, unspecified: Secondary | ICD-10-CM | POA: Diagnosis not present

## 2021-01-29 DIAGNOSIS — R29818 Other symptoms and signs involving the nervous system: Secondary | ICD-10-CM | POA: Diagnosis not present

## 2021-01-29 DIAGNOSIS — I10 Essential (primary) hypertension: Secondary | ICD-10-CM | POA: Diagnosis not present

## 2021-01-29 DIAGNOSIS — I6782 Cerebral ischemia: Secondary | ICD-10-CM | POA: Diagnosis not present

## 2021-01-29 LAB — DIFFERENTIAL
Abs Immature Granulocytes: 0.03 10*3/uL (ref 0.00–0.07)
Basophils Absolute: 0 10*3/uL (ref 0.0–0.1)
Basophils Relative: 0 %
Eosinophils Absolute: 0.1 10*3/uL (ref 0.0–0.5)
Eosinophils Relative: 1 %
Immature Granulocytes: 0 %
Lymphocytes Relative: 13 %
Lymphs Abs: 1 10*3/uL (ref 0.7–4.0)
Monocytes Absolute: 0.9 10*3/uL (ref 0.1–1.0)
Monocytes Relative: 12 %
Neutro Abs: 5.7 10*3/uL (ref 1.7–7.7)
Neutrophils Relative %: 74 %

## 2021-01-29 LAB — URINALYSIS, COMPLETE (UACMP) WITH MICROSCOPIC
Bacteria, UA: NONE SEEN
Bilirubin Urine: NEGATIVE
Glucose, UA: NEGATIVE mg/dL
Hgb urine dipstick: NEGATIVE
Ketones, ur: NEGATIVE mg/dL
Leukocytes,Ua: NEGATIVE
Nitrite: NEGATIVE
Protein, ur: NEGATIVE mg/dL
Specific Gravity, Urine: 1.013 (ref 1.005–1.030)
pH: 6 (ref 5.0–8.0)

## 2021-01-29 LAB — IRON AND TIBC
Iron: 38 ug/dL (ref 28–170)
Saturation Ratios: 9 % — ABNORMAL LOW (ref 10.4–31.8)
TIBC: 409 ug/dL (ref 250–450)
UIBC: 371 ug/dL

## 2021-01-29 LAB — COMPREHENSIVE METABOLIC PANEL
ALT: 16 U/L (ref 0–44)
AST: 22 U/L (ref 15–41)
Albumin: 3.3 g/dL — ABNORMAL LOW (ref 3.5–5.0)
Alkaline Phosphatase: 55 U/L (ref 38–126)
Anion gap: 10 (ref 5–15)
BUN: 23 mg/dL (ref 8–23)
CO2: 26 mmol/L (ref 22–32)
Calcium: 8.5 mg/dL — ABNORMAL LOW (ref 8.9–10.3)
Chloride: 100 mmol/L (ref 98–111)
Creatinine, Ser: 0.88 mg/dL (ref 0.44–1.00)
GFR, Estimated: >60 mL/min (ref >60)
Glucose, Bld: 110 mg/dL — ABNORMAL HIGH (ref 70–99)
Potassium: 4.2 mmol/L (ref 3.5–5.1)
Sodium: 136 mmol/L (ref 135–145)
Total Bilirubin: 0.6 mg/dL (ref 0.3–1.2)
Total Protein: 6 g/dL — ABNORMAL LOW (ref 6.5–8.1)

## 2021-01-29 LAB — CBC
HCT: 27.3 % — ABNORMAL LOW (ref 36.0–46.0)
Hemoglobin: 8.7 g/dL — ABNORMAL LOW (ref 12.0–15.0)
MCH: 29 pg (ref 26.0–34.0)
MCHC: 31.9 g/dL (ref 30.0–36.0)
MCV: 91 fL (ref 80.0–100.0)
Platelets: 269 10*3/uL (ref 150–400)
RBC: 3 MIL/uL — ABNORMAL LOW (ref 3.87–5.11)
RDW: 16.6 % — ABNORMAL HIGH (ref 11.5–15.5)
WBC: 7.8 10*3/uL (ref 4.0–10.5)
nRBC: 0 % (ref 0.0–0.2)

## 2021-01-29 LAB — APTT: aPTT: 34 seconds (ref 24–36)

## 2021-01-29 LAB — PROTIME-INR
INR: 1.1 (ref 0.8–1.2)
Prothrombin Time: 14.5 seconds (ref 11.4–15.2)

## 2021-01-29 LAB — TROPONIN I (HIGH SENSITIVITY): Troponin I (High Sensitivity): 11 ng/L (ref <18)

## 2021-01-29 LAB — RESP PANEL BY RT-PCR (FLU A&B, COVID) ARPGX2
Influenza A by PCR: NEGATIVE
Influenza B by PCR: NEGATIVE
SARS Coronavirus 2 by RT PCR: NEGATIVE

## 2021-01-29 MED ORDER — FLECAINIDE ACETATE 50 MG PO TABS
50.0000 mg | ORAL_TABLET | Freq: Two times a day (BID) | ORAL | Status: DC
  Administered 2021-01-30: 50 mg via ORAL
  Filled 2021-01-29 (×4): qty 1

## 2021-01-29 MED ORDER — SODIUM CHLORIDE 0.9 % IV SOLN: INTRAVENOUS | Status: DC

## 2021-01-29 MED ORDER — PANTOPRAZOLE SODIUM 40 MG PO TBEC: 40.0000 mg | DELAYED_RELEASE_TABLET | Freq: Every day | ORAL | Status: DC

## 2021-01-29 MED ORDER — PANTOPRAZOLE SODIUM 40 MG IV SOLR
40.0000 mg | Freq: Two times a day (BID) | INTRAVENOUS | Status: DC
  Administered 2021-01-29 – 2021-01-30 (×2): 40 mg via INTRAVENOUS
  Filled 2021-01-29 (×2): qty 40

## 2021-01-29 MED ORDER — SERTRALINE HCL 50 MG PO TABS
50.0000 mg | ORAL_TABLET | Freq: Every day | ORAL | Status: DC
  Administered 2021-01-30: 50 mg via ORAL
  Filled 2021-01-29: qty 1

## 2021-01-29 MED ORDER — ATORVASTATIN CALCIUM 20 MG PO TABS
40.0000 mg | ORAL_TABLET | Freq: Every day | ORAL | Status: DC
  Administered 2021-01-30: 40 mg via ORAL
  Filled 2021-01-29 (×2): qty 2

## 2021-01-29 MED ORDER — LISINOPRIL 20 MG PO TABS
40.0000 mg | ORAL_TABLET | Freq: Every day | ORAL | Status: DC
  Administered 2021-01-30: 40 mg via ORAL
  Filled 2021-01-29: qty 2

## 2021-01-29 MED ORDER — DICLOFENAC SODIUM 1 % EX GEL
2.0000 g | Freq: Three times a day (TID) | CUTANEOUS | Status: DC | PRN
  Administered 2021-01-30: 2 g via TOPICAL
  Filled 2021-01-29: qty 100

## 2021-01-29 MED ORDER — PSYLLIUM 95 % PO PACK
1.0000 | PACK | Freq: Every day | ORAL | Status: DC
  Filled 2021-01-29: qty 1

## 2021-01-29 MED ORDER — CALCIUM CARBONATE-VITAMIN D 500-200 MG-UNIT PO TABS
1.0000 | ORAL_TABLET | Freq: Every day | ORAL | Status: DC
  Administered 2021-01-30: 1 via ORAL
  Filled 2021-01-29 (×2): qty 1

## 2021-01-29 MED ORDER — ALBUTEROL SULFATE (2.5 MG/3ML) 0.083% IN NEBU: 2.5000 mg | INHALATION_SOLUTION | Freq: Four times a day (QID) | RESPIRATORY_TRACT | Status: DC | PRN

## 2021-01-29 MED ORDER — ACETAMINOPHEN 650 MG RE SUPP: 650.0000 mg | Freq: Four times a day (QID) | RECTAL | Status: DC | PRN

## 2021-01-29 MED ORDER — LEVOTHYROXINE SODIUM 25 MCG PO TABS
25.0000 ug | ORAL_TABLET | Freq: Every day | ORAL | Status: DC
  Administered 2021-01-30: 25 ug via ORAL
  Filled 2021-01-29: qty 1

## 2021-01-29 MED ORDER — OXYCODONE-ACETAMINOPHEN 7.5-325 MG PO TABS
1.0000 | ORAL_TABLET | Freq: Three times a day (TID) | ORAL | Status: DC | PRN
  Administered 2021-01-29: 0.5 via ORAL
  Filled 2021-01-29: qty 1

## 2021-01-29 MED ORDER — ACETAMINOPHEN 325 MG PO TABS: 650.0000 mg | ORAL_TABLET | Freq: Four times a day (QID) | ORAL | Status: DC | PRN

## 2021-01-29 MED ORDER — ALPRAZOLAM 0.25 MG PO TABS
0.2500 mg | ORAL_TABLET | Freq: Two times a day (BID) | ORAL | Status: DC | PRN
  Administered 2021-01-30: 0.25 mg via ORAL
  Filled 2021-01-29 (×2): qty 1

## 2021-01-29 MED ORDER — TRAZODONE HCL 50 MG PO TABS
50.0000 mg | ORAL_TABLET | Freq: Every day | ORAL | Status: DC
  Administered 2021-01-29: 50 mg via ORAL
  Filled 2021-01-29: qty 1

## 2021-01-29 MED ORDER — ONDANSETRON HCL 4 MG/2ML IJ SOLN: 4.0000 mg | Freq: Four times a day (QID) | INTRAMUSCULAR | Status: DC | PRN

## 2021-01-29 MED ORDER — POLYETHYLENE GLYCOL 3350 17 G PO PACK: 17.0000 g | PACK | Freq: Every day | ORAL | Status: DC | PRN

## 2021-01-29 MED ORDER — ONDANSETRON HCL 4 MG PO TABS: 4.0000 mg | ORAL_TABLET | Freq: Four times a day (QID) | ORAL | Status: DC | PRN

## 2021-01-29 NOTE — ED Notes (Signed)
Pt sitting in chair on cell phone.  Oxygen in place   Pt alert  Speech clear.  Pt in no distress.

## 2021-01-29 NOTE — ED Provider Notes (Signed)
[facility] Emergency Department Provider Note ____________________________________________   Event Date/Time   First MD Initiated Contact with Patient 01/29/21 1705     (approximate)  I have reviewed the triage vital signs and the nursing notes.   HISTORY  Chief Complaint Aphasia and Headache  HPI [name] is a 75 y.o. female with history of A. fib on Eliquis, CHF, COPD, hypertension and remaining history as listed below presents to the emergency department for treatment and evaluation of stuttering speech. At baseline, she stutters but not as bad as today. She also states that her "head feels funny," but denies headache. She also complains of shortness of breath and dizziness. She denies recent fall or head injury. She denies feeling weak in her extremities. She denies confusion or vision changes.      Past Medical History:  Diagnosis Date  . 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 s/p bioprosthetic valve replacement 2008 at [facility];  b. 01/2015 Echo: EF 60-65%, no rwma, Gr1 DD, mildly dil LA, nl RV fxn.  . Atrial fibrillation with RVR ([place]) 01/11/2015  . Atrial flutter ([place])    a. 08/2016 s/p DCCV.  Remains on flecainide 50 mg bid.  . Basal skull fracture ([place]) 20 yrs ago  . CHF (congestive heart failure) ([place])   . Chronic respiratory failure ([place])   . COPD (chronic obstructive pulmonary disease) ([place])    a. on home O2 at 2L since 2008  . 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 Essential hypertension 01/24/2015  . History of cardiac cath    a. 2008 prior to Aortic aneurysm repair-->nl cors.  . History of stress test    a. 10/2015 MV: no ischemia/infarct.  [name] HLD (hyperlipidemia)   . HTN (hypertension)   . Hypothyroidism   . Obesity   . PAF (paroxysmal atrial fibrillation) ([name])    a. on Eliquis; b. CHADS2VASc = 3 (HTN, age x 1, female).  . Paroxysmal atrial fibrillation ([place]) 01/24/2015  . Right upper quadrant  abdominal tenderness without rebound tenderness 03/02/2018  . S/P ascending aortic aneurysm repair 2008    Patient Active Problem List   Diagnosis Date Noted  . Symptomatic anemia 01/29/2021  . Acute on chronic diastolic CHF (congestive heart failure) ([place]) 05/11/2020  . COPD (chronic obstructive pulmonary disease) ([place])   . Hypothyroidism   . Acute non-recurrent frontal sinusitis 04/22/2020  . Vertigo 04/22/2020  . S/P placement of cardiac pacemaker 02/21/2020  . Episode of moderate major depression ([place]) 10/10/2019  . Persistent atrial fibrillation ([place])   . Encounter for general adult medical examination with abnormal findings 07/10/2019  . Encounter for screening mammogram for malignant neoplasm of breast 07/10/2019  . Atopic dermatitis 05/02/2019  . Paroxysmal atrial flutter ([place]) 08/11/2018  . Encounter for long-term (current) use of medications 07/09/2018  . Chronic left shoulder pain 07/09/2018  . Conjunctivitis 07/09/2018  . Oxygen dependent 07/09/2018  . GAD (generalized anxiety disorder) 07/09/2018  . Ovarian failure 07/09/2018  . Positive colorectal cancer screening using Cologuard test 04/24/2018  . Calculus of gallbladder with acute on chronic cholecystitis 04/08/2018  . H/O: CVA (cerebrovascular accident) 04/08/2018  . Dysuria 03/02/2018  . Urinary tract infection without hematuria 03/02/2018  . Right upper quadrant abdominal tenderness without rebound tenderness 03/02/2018  . 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 exacerbation ([place]) 03/02/2018  . Chronic obstructive pulmonary disease ([place]) 09/19/2017  . Typical atrial flutter ([place])   .  Irritable bowel syndrome without diarrhea 01/29/2015  . Essential hypertension 01/24/2015  . Paroxysmal atrial fibrillation ([place]) 01/24/2015  . History of aortic valve replacement with bioprosthetic valve 01/24/2015  . Atrial fibrillation with RVR ([place]) 01/11/2015  . 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 Impingement syndrome of shoulder 05/18/2014  . Cellulitis and abscess 02/13/2014  . MRSA (methicillin resistant Staphylococcus aureus) 02/13/2014  . Aneurysm, ascending aorta ([place]) 06/23/2013  . Bicuspid aortic valve 06/23/2013    Past Surgical History:  Procedure Laterality Date  . ABDOMINAL AORTIC ANEURYSM REPAIR  2008  . ABDOMINAL HYSTERECTOMY    . AORTIC VALVE REPLACEMENT  2008  . CARDIAC CATHETERIZATION     [facility]  . CARDIOVERSION N/A 08/15/2018   Procedure: CARDIOVERSION;  Surgeon: [name], MD;  Location: [facility] ORS;  Service: Cardiovascular;  Laterality: N/A;  . CARDIOVERSION N/A 11/14/2018   Procedure: CARDIOVERSION (CATH LAB);  Surgeon: [name], MD;  Location: [facility] ORS;  Service: Cardiovascular;  Laterality: N/A;  . CARDIOVERSION N/A 06/05/2019   Procedure: CARDIOVERSION;  Surgeon: [name], MD;  Location: [facility] ORS;  Service: Cardiovascular;  Laterality: N/A;  . CARDIOVERSION N/A 08/14/2019   Procedure: CARDIOVERSION;  Surgeon: [name], MD;  Location: [facility] ORS;  Service: Cardiovascular;  Laterality: N/A;  . CARDIOVERSION N/A 11/09/2019   Procedure: CARDIOVERSION;  Surgeon: Isaias Cowman, MD;  Location: [facility] ORS;  Service: Cardiovascular;  Laterality: N/A;  . CARDIOVERSION N/A 01/17/2020   Procedure: CARDIOVERSION;  Surgeon: Isaias Cowman, MD;  Location: [facility] ORS;  Service: Cardiovascular;  Laterality: N/A;  . CARPAL TUNNEL RELEASE    . ELECTROPHYSIOLOGIC STUDY N/A 08/17/2016   Procedure: Cardioversion;  Surgeon: [name], MD;  Location: [facility] ORS;  Service: Cardiovascular;  Laterality: N/A;  . TUMOR EXCISION Left    x3 (arm)    Prior to Admission medications   Medication Sig Start Date End Date Taking? Authorizing Provider  acetaminophen (TYLENOL) 500 MG tablet Take 500 mg by mouth every 4 (four) hours as needed for moderate pain or headache.     [provider]  albuterol (PROVENTIL) (2.5 MG/3ML)  0.083% nebulizer solution Take 3 mLs (2.5 mg total) by nebulization every 6 (six) hours as needed for wheezing or shortness of breath. 04/15/20   Kendell Bane, NP  ALPRAZolam Duanne Moron) 0.25 MG tablet Take 1 tablet (0.25 mg total) by mouth 2 (two) times daily as needed for anxiety. 01/20/21   Jonetta Osgood, NP  atorvastatin (LIPITOR) 40 MG tablet 1 tab po qhs 08/15/20   [name], MD  bumetanide (BUMEX) 1 MG tablet Take 2 tablets (2 mg total) by mouth 2 (two) times daily. 08/20/20 09/19/20  Alisa Graff, FNP  Calcium Carbonate-Vitamin D (CALCIUM 600+D PO) Take 1 tablet by mouth daily.    [provider]  Coenzyme Q10 (COQ10) 200 MG CAPS Take 200 mg by mouth daily.    [provider]  ELIQUIS 5 MG TABS tablet Take 1 tablet by mouth twice daily. Patient taking differently: Take 5 mg by mouth 2 (two) times daily. 10/26/19   [name], MD  ferrous sulfate 325 (65 FE) MG tablet Take 650 mg by mouth daily with breakfast.     [provider]  flecainide (TAMBOCOR) 50 MG tablet Take 1 tablet (50 mg total) by mouth 2 (two) times daily. PLEASE CONTACT OFFICE [phone] TO SCHEDULE APPOINTMENT FOR FUTURE REFILLS 02/23/20   Deboraha Sprang, MD  levothyroxine (SYNTHROID) 25  MCG tablet Take 1 tablet (25 mcg total) by mouth daily before breakfast. 12/24/20   [name], MD  lisinopril (ZESTRIL) 40 MG tablet Take 1 tablet (40 mg total) by mouth daily. 10/07/20 01/05/21  Alisa Graff, FNP  mupirocin nasal ointment (BACTROBAN) 2 % Use one-half of tube in each nostril twice daily for five (5) days. After application, press sides of nose together and gently massage. 08/20/20   [name], MD  oxyCODONE-acetaminophen (PERCOCET) 7.5-325 MG tablet Take half to one tab po qd prn for pain 12/23/20   Luiz Ochoa, NP  pantoprazole (PROTONIX) 40 MG tablet Take 1 tablet (40 mg total) by mouth daily. 12/24/20   [name], MD  potassium chloride (KLOR-CON) 10 MEQ tablet Take 4  tablets (40 mEq total) by mouth 2 (two) times daily. 11/28/20   [name], MD  psyllium (METAMUCIL) 58.6 % packet Take 1 packet by mouth daily. Mix in water or juice. 01/20/21   Jonetta Osgood, NP  sertraline (ZOLOFT) 50 MG tablet Take 1 tablet (50 mg total) by mouth daily. 12/04/20   Luiz Ochoa, NP  traZODone (DESYREL) 50 MG tablet Take 1 tablet by mouth at bedtime. 11/19/20   Luiz Ochoa, NP  TRELEGY ELLIPTA 100-62.5-25 MCG/INH AEPB Inhale 1 puff by mouth into the lungs daily. 12/24/20   [name], MD    Allergies Benadryl [diphenhydramine hcl (sleep)], Cetirizine, Lasix [furosemide], Levaquin [levofloxacin in d5w], Meloxicam, Soy allergy, and Sulfa antibiotics  Family History  Problem Relation Age of Onset  . Stroke Father   . Stroke Paternal Grandmother     Social History Social History   Tobacco Use  . Smoking status: Former Smoker    Types: Cigarettes  . Smokeless tobacco: Never Used  Vaping Use  . Vaping Use: Never used  Substance Use Topics  . Alcohol use: No  . Drug use: No    Review of Systems  Constitutional: No fever/chills Eyes: No visual changes. ENT: No sore throat. Cardiovascular: Denies chest pain. Respiratory: Positive for shortness of breath. Gastrointestinal: No abdominal pain.  No nausea, no vomiting.  No diarrhea.  No constipation. Genitourinary: Negative for dysuria. Musculoskeletal: Negative for back pain. Skin: Negative for rash. Neurological: Negative for headaches, focal weakness or numbness. Positive for stuttering speech pattern. ____________________________________________   PHYSICAL EXAM:  VITAL SIGNS: ED Triage Vitals [01/29/21 1704]  Enc Vitals Group     BP 136/62     Pulse 72     Resp 23     Temp 98.6     Temp src      SpO2 98     Weight 178 lb (80.7 kg)     Height 4\' 10"  (1.473 m)     Head Circumference      Peak Flow      Pain Score 0     Pain Loc      Pain Edu?      Excl. in [place]?     Constitutional:  Alert and oriented. Well appearing and in no acute distress. Eyes: Conjunctivae are normal. PERRL. EOMI. Head: Atraumatic. Nose: No congestion/rhinnorhea. Mouth/Throat: Mucous membranes are moist.  Oropharynx non-erythematous. Neck: No stridor.   Hematological/Lymphatic/Immunilogical: No cervical lymphadenopathy. Cardiovascular: Normal rate, regular rhythm. Grossly normal heart sounds.  Good peripheral circulation. Respiratory: Normal respiratory effort.  No retractions. Lungs CTAB. Gastrointestinal: Soft and nontender. No distention. No abdominal bruits. No CVA tenderness. Genitourinary:  Musculoskeletal: No lower extremity tenderness nor edema.  No joint effusions. Neurologic:  Normal speech with distraction. Alert and oriented x 4. No facial droop. Tongue protrudes midline. Extremities equal in strength. No gross focal neurologic deficits are appreciated. No gait instability. Skin:  Skin is warm, dry and intact. No rash noted. Psychiatric: Mood and affect are normal. Stuttering speech intermittent and behavior are normal.  ____________________________________________   LABS (all labs ordered are listed, but only abnormal results are displayed)  Labs Reviewed  CBC - Abnormal; Notable for the following components:      Result Value   RBC 3.00 (*)    Hemoglobin 8.7 (*)    HCT 27.3 (*)    RDW 16.6 (*)    All other components within normal limits  COMPREHENSIVE METABOLIC PANEL - Abnormal; Notable for the following components:   Glucose, Bld 110 (*)    Calcium 8.5 (*)    Total Protein 6.0 (*)    Albumin 3.3 (*)    All other components within normal limits  URINALYSIS, COMPLETE (UACMP) WITH MICROSCOPIC - Abnormal; Notable for the following components:   Color, Urine YELLOW (*)    APPearance CLEAR (*)    All other components within normal limits  RESP PANEL BY RT-PCR (FLU A&B, COVID) ARPGX2  PROTIME-INR  APTT  DIFFERENTIAL  TROPONIN I (HIGH SENSITIVITY)    ____________________________________________  EKG  ED ECG REPORT I, Savion Washam, FNP-BC personally viewed and interpreted this ECG.   Date: 01/29/2021  Rate: 70  Rhythm: unchanged from previous tracings, atrial fibrillation, rate   Axis: normal  Intervals:none  ST&T Change: no ST elevation  ____________________________________________  RADIOLOGY  ED MD interpretation:    No acute intracranial pathology on CT.  I, Tykera Skates, personally viewed and evaluated these images (plain radiographs) as part of my medical decision making, as well as reviewing the written report by the radiologist.  Official radiology report(s): CT HEAD WO CONTRAST  Result Date: 01/29/2021 CLINICAL DATA:  75 year old female with neurologic deficit. EXAM: CT HEAD WITHOUT CONTRAST TECHNIQUE: Contiguous axial images were obtained from the base of the skull through the vertex without intravenous contrast. COMPARISON:  Head CT dated 04/20/2016. FINDINGS: Brain: Moderate age-related atrophy and chronic microvascular ischemic changes. There is no acute intracranial hemorrhage. No mass effect or midline shift. Bilateral hemispheric low attenuating subdural collections may represent dilated subdural spaces due to volume loss or old hygroma. Vascular: No hyperdense vessel or unexpected calcification. Skull: Normal. Negative for fracture or focal lesion. Sinuses/Orbits: There is partial opacification. Of the right sphenoid sinus the remainder of the visualized paranasal sinuses and mastoid air cells are clear. No air-fluid level. Other: None IMPRESSION: 1. No acute intracranial pathology. 2. Moderate age-related atrophy and chronic microvascular ischemic changes. Electronically Signed   By: Anner Crete M.D.   On: 01/29/2021 17:48    ____________________________________________   PROCEDURES  Procedure(s) performed (including Critical  Care):  Procedures  ____________________________________________   INITIAL IMPRESSION / ASSESSMENT AND PLAN     75 year old female presenting to the emergency department for treatment and evaluation of increase in stuttering from baseline.  She also has some occasional shortness of breath and dizziness.  See HPI for further details.  Plan will be to get a CT of her head and get some labs.  She has an overall reassuring exam.  She has no extremity weakness, facial droop, slurred speech, headache, or vision changes.  Her speech pattern appears to be functional.  DIFFERENTIAL DIAGNOSIS  Functional speech pattern, CVA, TIA  ED COURSE Nursing  notes have mentioned aphasia however I do not feel that this is true aphasia since she does have a stuttered speech at baseline.  She also has no other weakness or neurodeficits.  Labs show drop in hemoglobin from 11.9 2 months ago to 8.7 today.  She is Hemoccult positive.  She is now up walking around in the room without assistance.  She has wrapped her blanket around her back.  Her speech is normal with her baseline stutter.  We discussed admission for symptomatic anemia.  This is likely the cause of her shortness of breath and dizziness.  Plan will be to admit her via hospitalist.    ___________________________________________   FINAL CLINICAL IMPRESSION(S) / ED DIAGNOSES  Final diagnoses:  Symptomatic anemia  Lower GI bleed     ED Discharge Orders    None       ANAALICIA REIMANN was evaluated in Emergency Department on 01/29/2021 for the symptoms described in the history of present illness. She was evaluated in the context of the global COVID-19 pandemic, which necessitated consideration that the patient might be at risk for infection with the SARS-CoV-2 virus that causes COVID-19. Institutional protocols and algorithms that pertain to the evaluation of patients at risk for COVID-19 are in a state of rapid change based on information released  by regulatory bodies including the CDC and federal and state organizations. These policies and algorithms were followed during the patient's care in the ED.   Note:  This document was prepared using Dragon voice recognition software and may include unintentional dictation errors.   Victorino Dike, FNP 01/29/21 2005    Naaman Plummer, MD 01/29/21 2242

## 2021-01-29 NOTE — ED Notes (Signed)
Pt alert waiting on ct scan

## 2021-01-29 NOTE — ED Triage Notes (Signed)
Pt comes into the ED via [facility] from home c/o headache and new aphasia that started 1630.  Pt informed EMS that her "head feels funny and chest feels funny".  Pt is currently on blood thinners.  Pt unilateral weakness, no droop, patient able to stand with EMS. Pt is alert and oriented x4.  Pt has baseline of speech impediment but it is worse to the point where it is accompanied with the aphasia. 20g L hand. CBG 136, 108/51.  [facility] at 16:00.

## 2021-01-29 NOTE — ED Notes (Signed)
Pt ambulated to bathroom without diff.  Pt alert  Speech clear now.  Pt on 3 liters  Oxygen.  Pt sitting in chair per her request.

## 2021-01-29 NOTE — ED Notes (Signed)
Pt remains sitting in chair watching tv.  Pt alert  Speech clear. Pt on 3l iters oxygen [place].

## 2021-01-29 NOTE — ED Notes (Signed)
Pt reports no headache at this time.  Pt has upper abd pain/chest discomfort.  No n/v/d.    Pt is from home. Pt on 3 liters oxygen at home.  Pt has speech impediment.   Iv in place.  Pt alert.

## 2021-01-29 NOTE — H&P (Addendum)
[facility] History and Physical  [name] TML:[id number] DOB: March 06, 1946 DOA: 01/29/2021  Referring physician: ED  PCP: [name], MD   Patient is coming from: Home  Chief Complaint: Dizziness and difficulty speaking  HPI: [name] is a 75 y.o. female with past medical history of atrial fibrillation on Eliquis, congestive heart failure, COPD, hypertension presented to hospital with evaluation of some dizziness and weakness with increased stuttering.  At baseline, she is stutters but not as bad as today.  Patient complained of head feeling funny but denied any  focal weakness.  She complained of generalized weakness of the body.  Denied any confusion, visual disturbances.  She also complains of vague chest discomfort /upper abdominal discomfort but denied any nausea vomiting.  Patient has intermittent abdominal discomfort.  She reports history of constipation and diarrhea in the past reports history of colonoscopy in the past but has not had any endoscopy.  She takes iron at baseline.  Denies any hematemesis or melena as such but she does not usually look at her stools.  Patient has been chronically on blood thinners for a long time now.  Patient uses oxygen at home chronically at 3 L/min.  ED Course: In the ED, patient had normal vitals.  COVID test was negative.  UA was unremarkable BMP was unremarkable as well.  Patient was noted to have a hemoglobin of 8.7 decreased from previous hemoglobin of 11.0, eight months back.  She was was strongly stool heme positive in the ED.  Patient was then considered for admission to hospital for symptomatic anemia.  Review of Systems:  All systems were reviewed and were negative unless otherwise mentioned in the HPI  Past Medical History:  Diagnosis Date  . 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 s/p bioprosthetic valve replacement 2008 at [facility];  b. 01/2015 Echo: EF 60-65%, no rwma, Gr1 DD, mildly dil LA, nl RV fxn.  . Atrial  fibrillation with RVR ([place]) 01/11/2015  . Atrial flutter ([place])    a. 08/2016 s/p DCCV.  Remains on flecainide 50 mg bid.  . Basal skull fracture ([place]) 20 yrs ago  . CHF (congestive heart failure) ([place])   . Chronic respiratory failure ([place])   . COPD (chronic obstructive pulmonary disease) ([place])    a. on home O2 at 2L since 2008  . 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 Essential hypertension 01/24/2015  . History of cardiac cath    a. 2008 prior to Aortic aneurysm repair-->nl cors.  . History of stress test    a. 10/2015 MV: no ischemia/infarct.  [name] HLD (hyperlipidemia)   . HTN (hypertension)   . Hypothyroidism   . Obesity   . PAF (paroxysmal atrial fibrillation) ([name])    a. on Eliquis; b. CHADS2VASc = 3 (HTN, age x 1, female).  . Paroxysmal atrial fibrillation ([place]) 01/24/2015  . Right upper quadrant abdominal tenderness without rebound tenderness 03/02/2018  . S/P ascending aortic aneurysm repair 2008   Past Surgical History:  Procedure Laterality Date  . ABDOMINAL AORTIC ANEURYSM REPAIR  2008  . ABDOMINAL HYSTERECTOMY    . AORTIC VALVE REPLACEMENT  2008  . CARDIAC CATHETERIZATION     [facility]  . CARDIOVERSION N/A 08/15/2018   Procedure: CARDIOVERSION;  Surgeon: [name], MD;  Location: [facility] ORS;  Service: Cardiovascular;  Laterality: N/A;  . CARDIOVERSION N/A 11/14/2018   Procedure: CARDIOVERSION (CATH LAB);  Surgeon:  [name], MD;  Location: [facility] ORS;  Service: Cardiovascular;  Laterality: N/A;  . CARDIOVERSION N/A 06/05/2019   Procedure: CARDIOVERSION;  Surgeon: [name], MD;  Location: [facility] ORS;  Service: Cardiovascular;  Laterality: N/A;  . CARDIOVERSION N/A 08/14/2019   Procedure: CARDIOVERSION;  Surgeon: [name], MD;  Location: [facility] ORS;  Service: Cardiovascular;  Laterality: N/A;  . CARDIOVERSION N/A 11/09/2019   Procedure: CARDIOVERSION;  Surgeon: Isaias Cowman, MD;  Location: [facility] ORS;  Service: Cardiovascular;   Laterality: N/A;  . CARDIOVERSION N/A 01/17/2020   Procedure: CARDIOVERSION;  Surgeon: Isaias Cowman, MD;  Location: [facility] ORS;  Service: Cardiovascular;  Laterality: N/A;  . CARPAL TUNNEL RELEASE    . ELECTROPHYSIOLOGIC STUDY N/A 08/17/2016   Procedure: Cardioversion;  Surgeon: [name], MD;  Location: [facility] ORS;  Service: Cardiovascular;  Laterality: N/A;  . TUMOR EXCISION Left    x3 (arm)    Social History:  reports that she has quit smoking. Her smoking use included cigarettes. She has never used smokeless tobacco. She reports that she does not drink alcohol and does not use drugs.  Allergies  Allergen Reactions  . Benadryl [Diphenhydramine Hcl (Sleep)] Palpitations  . Cetirizine Palpitations  . Lasix [Furosemide] Rash  . Levaquin [Levofloxacin In D5w] Other (See Comments)    Reaction:  Fatigue and muscle soreness  . Meloxicam Rash  . Soy Allergy Hives and Nausea And Vomiting  . Sulfa Antibiotics Rash    Family History  Problem Relation Age of Onset  . Stroke Father   . Stroke Paternal Grandmother      Prior to Admission medications   Medication Sig Start Date End Date Taking? Authorizing Provider  acetaminophen (TYLENOL) 500 MG tablet Take 500 mg by mouth every 4 (four) hours as needed for moderate pain or headache.     [provider]  albuterol (PROVENTIL) (2.5 MG/3ML) 0.083% nebulizer solution Take 3 mLs (2.5 mg total) by nebulization every 6 (six) hours as needed for wheezing or shortness of breath. 04/15/20   Kendell Bane, NP  ALPRAZolam Duanne Moron) 0.25 MG tablet Take 1 tablet (0.25 mg total) by mouth 2 (two) times daily as needed for anxiety. 01/20/21   Jonetta Osgood, NP  atorvastatin (LIPITOR) 40 MG tablet 1 tab po qhs 08/15/20   [name], MD  bumetanide (BUMEX) 1 MG tablet Take 2 tablets (2 mg total) by mouth 2 (two) times daily. 08/20/20 09/19/20  Alisa Graff, FNP  Calcium Carbonate-Vitamin D (CALCIUM 600+D PO) Take 1 tablet by mouth  daily.    [provider]  Coenzyme Q10 (COQ10) 200 MG CAPS Take 200 mg by mouth daily.    [provider]  ELIQUIS 5 MG TABS tablet Take 1 tablet by mouth twice daily. Patient taking differently: Take 5 mg by mouth 2 (two) times daily. 10/26/19   [name], MD  ferrous sulfate 325 (65 FE) MG tablet Take 650 mg by mouth daily with breakfast.     [provider]  flecainide (TAMBOCOR) 50 MG tablet Take 1 tablet (50 mg total) by mouth 2 (two) times daily. PLEASE CONTACT OFFICE [phone] TO SCHEDULE APPOINTMENT FOR FUTURE REFILLS 02/23/20   Deboraha Sprang, MD  levothyroxine (SYNTHROID) 25 MCG tablet Take 1 tablet (25 mcg total) by mouth daily before breakfast. 12/24/20   [name], MD  lisinopril (ZESTRIL) 40 MG tablet Take 1 tablet (40 mg total) by mouth daily. 10/07/20 01/05/21  Alisa Graff, FNP  mupirocin nasal ointment (BACTROBAN) 2 % Use one-half of tube in each nostril twice daily for five (5) days. After application, press sides of nose together and gently massage. 08/20/20   [name], MD  oxyCODONE-acetaminophen (PERCOCET) 7.5-325 MG tablet Take half to one tab po qd prn for pain 12/23/20   Luiz Ochoa, NP  pantoprazole (PROTONIX) 40 MG tablet Take 1 tablet (40 mg total) by mouth daily. 12/24/20   [name], MD  potassium chloride (KLOR-CON) 10 MEQ tablet Take 4 tablets (40 mEq total) by mouth 2 (two) times daily. 11/28/20   [name], MD  psyllium (METAMUCIL) 58.6 % packet Take 1 packet by mouth daily. Mix in water or juice. 01/20/21   Jonetta Osgood, NP  sertraline (ZOLOFT) 50 MG tablet Take 1 tablet (50 mg total) by mouth daily. 12/04/20   Luiz Ochoa, NP  traZODone (DESYREL) 50 MG tablet Take 1 tablet by mouth at bedtime. 11/19/20   Luiz Ochoa, NP  TRELEGY ELLIPTA 100-62.5-25 MCG/INH AEPB Inhale 1 puff by mouth into the lungs daily. 12/24/20   [name], MD    Physical Exam: Vitals:   01/29/21 1849 01/29/21 1900  01/29/21 1903 01/29/21 1922  BP:  136/62    Pulse:   72   Resp:      Temp: 98.6 F (37 C)     TempSrc: Oral     SpO2:    98%  Weight:      Height:       Wt Readings from Last 3 Encounters:  01/29/21 80.7 kg  01/20/21 81.6 kg  12/26/20 82.6 kg   Body mass index is 37.2 kg/m.  General: Obese built, not in obvious distress, on nasal cannula oxygen, hard of hearing HENT: Normocephalic, pupils equally reacting to light and accommodation.  Mild pallor noted. Oral mucosa is moist.  Chest:  Clear breath sounds.  Diminished breath sounds bilaterally. No crackles or wheezes.  CVS: S1 &S2 heard. No murmur.  Regular rate and rhythm.  Left chest wall pacemaker in place.  Previous CABG scar noted Abdomen: Soft, nontender, nondistended.  Bowel sounds are heard.  Liver is not palpable, no abdominal mass palpated Extremities: No cyanosis, clubbing or edema.  Peripheral pulses are palpable. Psych: Alert, awake and oriented, normal mood CNS:  No cranial nerve deficits.  Moving all extremities.  Skin: Warm and dry.  No rashes noted.  Labs on Admission:   CBC: Recent Labs  Lab 01/29/21 1711  WBC 7.8  NEUTROABS 5.7  HGB 8.7*  HCT 27.3*  MCV 91.0  PLT 191    Basic Metabolic Panel: Recent Labs  Lab 01/29/21 1711  NA 136  K 4.2  CL 100  CO2 26  GLUCOSE 110*  BUN 23  CREATININE 0.88  CALCIUM 8.5*    Liver Function Tests: Recent Labs  Lab 01/29/21 1711  AST 22  ALT 16  ALKPHOS 55  BILITOT 0.6  PROT 6.0*  ALBUMIN 3.3*   No results for input(s): LIPASE, AMYLASE in the last 168 hours. No results for input(s): AMMONIA in the last 168 hours.  Cardiac Enzymes: No results for input(s): CKTOTAL, CKMB, CKMBINDEX, TROPONINI in the last 168 hours.  BNP (last 3 results) Recent Labs    05/11/20 1219 05/13/20 0549 05/14/20 0714  BNP 1,961.1* 1,048.5* 638.3*    ProBNP (last 3 results) No results for input(s): PROBNP in the last 8760 hours.  CBG: No results for input(s):  GLUCAP in the  last 168 hours.    Drugs of Abuse  No results found for: LABOPIA, COCAINSCRNUR, LABBENZ, AMPHETMU, THCU, LABBARB    Radiological Exams on Admission: CT HEAD WO CONTRAST  Result Date: 01/29/2021 CLINICAL DATA:  75 year old female with neurologic deficit. EXAM: CT HEAD WITHOUT CONTRAST TECHNIQUE: Contiguous axial images were obtained from the base of the skull through the vertex without intravenous contrast. COMPARISON:  Head CT dated 04/20/2016. FINDINGS: Brain: Moderate age-related atrophy and chronic microvascular ischemic changes. There is no acute intracranial hemorrhage. No mass effect or midline shift. Bilateral hemispheric low attenuating subdural collections may represent dilated subdural spaces due to volume loss or old hygroma. Vascular: No hyperdense vessel or unexpected calcification. Skull: Normal. Negative for fracture or focal lesion. Sinuses/Orbits: There is partial opacification. Of the right sphenoid sinus the remainder of the visualized paranasal sinuses and mastoid air cells are clear. No air-fluid level. Other: None IMPRESSION: 1. No acute intracranial pathology. 2. Moderate age-related atrophy and chronic microvascular ischemic changes. Electronically Signed   By: Anner Crete M.D.   On: 01/29/2021 17:48    EKG: Personally reviewed by me which shows atrial fibrillation.  Assessment/Plan Principal Problem:   Symptomatic anemia Active Problems:   Essential hypertension   H/O: CVA (cerebrovascular accident)   GAD (generalized anxiety disorder)   Persistent atrial fibrillation ([name])   S/P placement of cardiac pacemaker  Symptomatic anemia.  Dizziness, lightheadedness, fatigue on presentation.  Patient is a strongly guaiac positive.  Patient on anticoagulation as outpatient.  Will hold for now.  Patient does have drop in hemoglobin by 3 points since last 2 months.  We will put the patient on PPI twice a day.  Communicated with Dr Carlynn Purl.[name]  Keep n.p.o.  past midnight.  No need for blood transfusion.  Persistent atrial fibrillation.  History of pacemaker placement.  Currently controlled.  On anticoagulant as outpatient.  Will hold anticoagulation for now..  Patient is on flecainide as outpatient.  Essential hypertension.  Will resume lisinopril.  History of abdominal aortic aneurysm repair/aortic valve replacement. No acute issues at this time.  History of CVA. Patient has baseline stuttering.  With distraction her stuttering goes away.  At the time of my evaluation she did not have any stuttering.  Very unlikely to be CVA  time.  Likely to be secondary to symptomatic anemia.-Has pacemaker for any MRI evaluation.  COPD/ chronic hypoxic respiratory failure.  On home oxygen and inhalers at home.  We will continue while in the hospital.  Closely monitor.  Hypothyroidism.  On Synthroid at home.  We will continue while in the hospital.  Latest TSH of 3.9 on 11/25/2020  Generalized anxiety disorder.  On sertraline and trazodone.  We will continue with that.  DVT Prophylaxis: SCD  Consultant: Informed via epic chat Dr. Haig Prophet GI  Code Status: Full code  Microbiology none  Antibiotics: None  Family Communication:  Patients' condition and plan of care including tests being ordered have been discussed with the patient who indicate understanding and agree with the plan.   Status is: Observation  The patient remains OBS appropriate and will d/c before 2 midnights.  Dispo: The patient is from: Home              Anticipated d/c is to: Home              Severity of Illness: The appropriate patient status for this patient is OBSERVATION. Observation status is judged to be reasonable and necessary in order to provide the  required intensity of service to ensure the patient's safety. The patient's presenting symptoms, physical exam findings, and initial radiographic and laboratory data in the context of their medical condition is felt to place  them at decreased risk for further clinical deterioration. Furthermore, it is anticipated that the patient will be medically stable for discharge from the hospital within 2 midnights of admission.  Signed, Flora Lipps, MD [facility] 01/29/2021

## 2021-01-29 NOTE — ED Notes (Signed)
Report messaged to [employer]

## 2021-01-29 NOTE — ED Notes (Signed)
Pt eating

## 2021-01-30 ENCOUNTER — Encounter: Payer: Self-pay | Admitting: Internal Medicine

## 2021-01-30 DIAGNOSIS — D509 Iron deficiency anemia, unspecified: Secondary | ICD-10-CM | POA: Diagnosis not present

## 2021-01-30 DIAGNOSIS — I4819 Other persistent atrial fibrillation: Secondary | ICD-10-CM | POA: Diagnosis not present

## 2021-01-30 DIAGNOSIS — I10 Essential (primary) hypertension: Secondary | ICD-10-CM | POA: Diagnosis not present

## 2021-01-30 DIAGNOSIS — D649 Anemia, unspecified: Secondary | ICD-10-CM | POA: Diagnosis not present

## 2021-01-30 DIAGNOSIS — J449 Chronic obstructive pulmonary disease, unspecified: Secondary | ICD-10-CM | POA: Diagnosis not present

## 2021-01-30 LAB — BASIC METABOLIC PANEL
Anion gap: 8 (ref 5–15)
BUN: 24 mg/dL — ABNORMAL HIGH (ref 8–23)
CO2: 25 mmol/L (ref 22–32)
Calcium: 8.6 mg/dL — ABNORMAL LOW (ref 8.9–10.3)
Chloride: 101 mmol/L (ref 98–111)
Creatinine, Ser: 0.97 mg/dL (ref 0.44–1.00)
GFR, Estimated: >60 mL/min (ref >60)
Glucose, Bld: 134 mg/dL — ABNORMAL HIGH (ref 70–99)
Potassium: 3.3 mmol/L — ABNORMAL LOW (ref 3.5–5.1)
Sodium: 134 mmol/L — ABNORMAL LOW (ref 135–145)

## 2021-01-30 LAB — CBC
HCT: 26.9 % — ABNORMAL LOW (ref 36.0–46.0)
Hemoglobin: 8.6 g/dL — ABNORMAL LOW (ref 12.0–15.0)
MCH: 28.6 pg (ref 26.0–34.0)
MCHC: 32 g/dL (ref 30.0–36.0)
MCV: 89.4 fL (ref 80.0–100.0)
Platelets: 243 10*3/uL (ref 150–400)
RBC: 3.01 MIL/uL — ABNORMAL LOW (ref 3.87–5.11)
RDW: 16.8 % — ABNORMAL HIGH (ref 11.5–15.5)
WBC: 7 10*3/uL (ref 4.0–10.5)
nRBC: 0 % (ref 0.0–0.2)

## 2021-01-30 LAB — MAGNESIUM: Magnesium: 2.1 mg/dL (ref 1.7–2.4)

## 2021-01-30 LAB — PHOSPHORUS: Phosphorus: 3.5 mg/dL (ref 2.5–4.6)

## 2021-01-30 LAB — FERRITIN: Ferritin: 21 ng/mL (ref 11–307)

## 2021-01-30 MED ORDER — BUMETANIDE 1 MG PO TABS
2.0000 mg | ORAL_TABLET | Freq: Two times a day (BID) | ORAL | Status: DC
  Filled 2021-01-30 (×2): qty 2

## 2021-01-30 MED ORDER — LORATADINE 10 MG PO TABS: 10.0000 mg | ORAL_TABLET | Freq: Every day | ORAL | Status: DC

## 2021-01-30 MED ORDER — FERROUS SULFATE 325 (65 FE) MG PO TABS: 650.0000 mg | ORAL_TABLET | Freq: Two times a day (BID) | ORAL | 3 refills | Status: DC

## 2021-01-30 MED ORDER — BUDESONIDE 0.25 MG/2ML IN SUSP: 0.2500 mg | Freq: Two times a day (BID) | RESPIRATORY_TRACT | Status: DC

## 2021-01-30 MED ORDER — SALINE SPRAY 0.65 % NA SOLN
1.0000 | NASAL | Status: DC | PRN
  Filled 2021-01-30: qty 44

## 2021-01-30 MED ORDER — POTASSIUM CHLORIDE CRYS ER 20 MEQ PO TBCR
40.0000 meq | EXTENDED_RELEASE_TABLET | Freq: Once | ORAL | Status: AC
  Administered 2021-01-30: 40 meq via ORAL
  Filled 2021-01-30: qty 2

## 2021-01-30 MED ORDER — ALBUTEROL SULFATE (2.5 MG/3ML) 0.083% IN NEBU: 2.5000 mg | INHALATION_SOLUTION | Freq: Four times a day (QID) | RESPIRATORY_TRACT | Status: DC | PRN

## 2021-01-30 MED ORDER — ALBUTEROL SULFATE HFA 108 (90 BASE) MCG/ACT IN AERS: 1.0000 | INHALATION_SPRAY | Freq: Four times a day (QID) | RESPIRATORY_TRACT | Status: DC | PRN

## 2021-01-30 MED ORDER — IPRATROPIUM BROMIDE 0.02 % IN SOLN
0.5000 mg | Freq: Four times a day (QID) | RESPIRATORY_TRACT | Status: DC
  Filled 2021-01-30: qty 2.5

## 2021-01-30 MED ORDER — ARFORMOTEROL TARTRATE 15 MCG/2ML IN NEBU
15.0000 ug | INHALATION_SOLUTION | Freq: Two times a day (BID) | RESPIRATORY_TRACT | Status: DC
  Filled 2021-01-30 (×2): qty 2

## 2021-01-30 MED ORDER — FLUTICASONE-UMECLIDIN-VILANT 100-62.5-25 MCG/INH IN AEPB: 1.0000 | INHALATION_SPRAY | Freq: Every day | RESPIRATORY_TRACT | Status: DC

## 2021-01-30 NOTE — Consult Note (Signed)
Consultation  Referring Provider:   Hospitalist Admit date: 5/25 Consult date: 5/26         Reason for Consultation:     Anemia         HPI:   [name] is a 75 y.o. 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 HFpEF, a. Fib, and bioprosthetic aortic valve replacement who presented with dizziness and stuttering who incidentally was found to have lower hemoglobin to 8.7 from baseline around 10. She has no overt GI bleeding such as melena or hematochezia. She had EGD/Colonoscopy about 2.5 years ago with overall normal EGD and colonoscopy with small polyps but fair prep but note mentioned no large lesions were seen and recommended 5 year f/u interval. She required extensive periprocedural evaluation do to her comorbidities.She takes iron chronically and on labs is not iron deficient although slightly lower ferritin. No NSAID use. On PPI.  Past Medical History:  Diagnosis Date  . 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 s/p bioprosthetic valve replacement 2008 at [facility];  b. 01/2015 Echo: EF 60-65%, no rwma, Gr1 DD, mildly dil LA, nl RV fxn.  . Atrial fibrillation with RVR ([place]) 01/11/2015  . Atrial flutter ([place])    a. 08/2016 s/p DCCV.  Remains on flecainide 50 mg bid.  . Basal skull fracture ([place]) 20 yrs ago  . CHF (congestive heart failure) ([place])   . Chronic respiratory failure ([place])   . COPD (chronic obstructive pulmonary disease) ([place])    a. on home O2 at 2L since 2008  . 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 Essential hypertension 01/24/2015  . History of cardiac cath    a. 2008 prior to Aortic aneurysm repair-->nl cors.  . History of stress test    a. 10/2015 MV: no ischemia/infarct.  [name] HLD (hyperlipidemia)   . HTN (hypertension)   . Hypothyroidism   . Obesity   . PAF (paroxysmal atrial fibrillation) ([name])    a. on Eliquis; b. CHADS2VASc = 3 (HTN, age x 1, female).  . Paroxysmal atrial fibrillation ([place]) 01/24/2015  . Right upper quadrant  abdominal tenderness without rebound tenderness 03/02/2018  . S/P ascending aortic aneurysm repair 2008    Past Surgical History:  Procedure Laterality Date  . ABDOMINAL AORTIC ANEURYSM REPAIR  2008  . ABDOMINAL HYSTERECTOMY    . AORTIC VALVE REPLACEMENT  2008  . CARDIAC CATHETERIZATION     [facility]  . CARDIOVERSION N/A 08/15/2018   Procedure: CARDIOVERSION;  Surgeon: [name], MD;  Location: [facility] ORS;  Service: Cardiovascular;  Laterality: N/A;  . CARDIOVERSION N/A 11/14/2018   Procedure: CARDIOVERSION (CATH LAB);  Surgeon: [name], MD;  Location: [facility] ORS;  Service: Cardiovascular;  Laterality: N/A;  . CARDIOVERSION N/A 06/05/2019   Procedure: CARDIOVERSION;  Surgeon: [name], MD;  Location: [facility] ORS;  Service: Cardiovascular;  Laterality: N/A;  . CARDIOVERSION N/A 08/14/2019   Procedure: CARDIOVERSION;  Surgeon: [name], MD;  Location: [facility] ORS;  Service: Cardiovascular;  Laterality: N/A;  . CARDIOVERSION N/A 11/09/2019   Procedure: CARDIOVERSION;  Surgeon: Isaias Cowman, MD;  Location: [facility] ORS;  Service: Cardiovascular;  Laterality: N/A;  . CARDIOVERSION N/A 01/17/2020   Procedure: CARDIOVERSION;  Surgeon: Isaias Cowman, MD;  Location: [facility] ORS;  Service: Cardiovascular;  Laterality: N/A;  . CARPAL TUNNEL RELEASE    . ELECTROPHYSIOLOGIC STUDY N/A 08/17/2016   Procedure: Cardioversion;  Surgeon: Rogue Jury  Ferne Reus, MD;  Location: [facility] ORS;  Service: Cardiovascular;  Laterality: N/A;  . TUMOR EXCISION Left    x3 (arm)    Family History  Problem Relation Age of Onset  . Stroke Father   . Stroke Paternal Grandmother      Social History   Tobacco Use  . Smoking status: Former Smoker    Types: Cigarettes  . Smokeless tobacco: Never Used  Vaping Use  . Vaping Use: Never used  Substance Use Topics  . Alcohol use: No  . Drug use: No    Prior to Admission medications   Medication Sig Start Date End Date Taking? Authorizing Provider   acetaminophen (TYLENOL) 500 MG tablet Take 500 mg by mouth every 4 (four) hours as needed for moderate pain or headache.    Yes [provider]  albuterol (PROVENTIL) (2.5 MG/3ML) 0.083% nebulizer solution Take 3 mLs (2.5 mg total) by nebulization every 6 (six) hours as needed for wheezing or shortness of breath. 04/15/20  Yes Scar[place], Audie Clear, NP  ALPRAZolam Duanne Moron) 0.25 MG tablet Take 1 tablet (0.25 mg total) by mouth 2 (two) times daily as needed for anxiety. 01/20/21  Yes Abernathy, Yetta Flock, NP  atorvastatin (LIPITOR) 40 MG tablet 1 tab po qhs 08/15/20  Yes [name], MD  bumetanide (BUMEX) 1 MG tablet Take 1 tablet by mouth 2 (two) times daily. 07/09/20  Yes [provider]  Calcium Carbonate-Vitamin D (CALCIUM 600+D PO) Take 1 tablet by mouth daily.   Yes [provider]  Coenzyme Q10 (COQ10) 200 MG CAPS Take 200 mg by mouth daily.   Yes [provider]  diclofenac Sodium (VOLTAREN) 1 % GEL SMARTSIG:Gram(s) Topical Twice Daily 09/09/20  Yes [provider]  ELIQUIS 5 MG TABS tablet Take 1 tablet by mouth twice daily. Patient taking differently: Take 5 mg by mouth 2 (two) times daily. 10/26/19  Yes [name], MD  ferrous sulfate 325 (65 FE) MG tablet Take 650 mg by mouth daily with breakfast.    Yes [provider]  flecainide (TAMBOCOR) 50 MG tablet Take 1 tablet (50 mg total) by mouth 2 (two) times daily. PLEASE CONTACT OFFICE [phone] TO SCHEDULE APPOINTMENT FOR FUTURE REFILLS 02/23/20  Yes Deboraha Sprang, MD  levothyroxine (SYNTHROID) 25 MCG tablet Take 1 tablet (25 mcg total) by mouth daily before breakfast. 12/24/20  Yes [name], MD  lisinopril (ZESTRIL) 40 MG tablet Take 1 tablet (40 mg total) by mouth daily. 10/07/20 01/05/21 Yes Hackney, Otila Kluver A, FNP  loratadine (CLARITIN) 10 MG tablet Take 10 mg by mouth daily.   Yes [provider]  mupirocin nasal ointment (BACTROBAN) 2 % Use one-half of tube in each nostril  twice daily for five (5) days. After application, press sides of nose together and gently massage. 08/20/20  Yes [name], MD  oxyCODONE-acetaminophen (PERCOCET) 7.5-325 MG tablet Take half to one tab po qd prn for pain Patient taking differently: Take 1 tablet by mouth. Take half to one tab po qd prn for pain 12/23/20  Yes Luiz Ochoa, NP  pantoprazole (PROTONIX) 40 MG tablet Take 1 tablet (40 mg total) by mouth daily. 12/24/20  Yes [name], MD  potassium chloride (KLOR-CON) 10 MEQ tablet Take 4 tablets (40 mEq total) by mouth 2 (two) times daily. 11/28/20  Yes [name], MD  psyllium (METAMUCIL) 58.6 % packet Take 1 packet by mouth daily. Mix in water or juice. 01/20/21  Yes  Jonetta Osgood, NP  sertraline (ZOLOFT) 50 MG tablet Take 1 tablet (50 mg total) by mouth daily. 12/04/20  Yes Luiz Ochoa, NP  traZODone (DESYREL) 50 MG tablet Take 1 tablet by mouth at bedtime. 11/19/20  Yes Luiz Ochoa, NP  TRELEGY ELLIPTA 100-62.5-25 MCG/INH AEPB Inhale 1 puff by mouth into the lungs daily. 12/24/20  Yes [name], MD  bumetanide (BUMEX) 1 MG tablet Take 2 tablets (2 mg total) by mouth 2 (two) times daily. 08/20/20 09/19/20  Alisa Graff, FNP    Current Facility-Administered Medications  Medication Dose Route Frequency Provider Last Rate Last Admin  . acetaminophen (TYLENOL) tablet 650 mg  650 mg Oral Q6H PRN Pokhrel, Laxman, MD       Or  . acetaminophen (TYLENOL) suppository 650 mg  650 mg Rectal Q6H PRN Pokhrel, Laxman, MD      . albuterol (PROVENTIL) (2.5 MG/3ML) 0.083% nebulizer solution 2.5 mg  2.5 mg Nebulization Q6H PRN Jennye Boroughs, MD      . ALPRAZolam Duanne Moron) tablet 0.25 mg  0.25 mg Oral BID PRN Pokhrel, Laxman, MD   0.25 mg at 01/30/21 1247  . budesonide (PULMICORT) nebulizer solution 0.25 mg  0.25 mg Nebulization BID Jennye Boroughs, MD       And  . arformoterol (BROVANA) nebulizer solution 15 mcg  15 mcg Nebulization BID Jennye Boroughs, MD       And  .  ipratropium (ATROVENT) nebulizer solution 0.5 mg  0.5 mg Nebulization Q6H Jennye Boroughs, MD      . atorvastatin (LIPITOR) tablet 40 mg  40 mg Oral Daily Pokhrel, Laxman, MD   40 mg at 01/30/21 0919  . bumetanide (BUMEX) tablet 2 mg  2 mg Oral BID Jennye Boroughs, MD      . calcium-vitamin D (OSCAL WITH D) 500-200 MG-UNIT per tablet 1 tablet  1 tablet Oral Daily Pokhrel, Laxman, MD   1 tablet at 01/30/21 0920  . diclofenac Sodium (VOLTAREN) 1 % topical gel 2 g  2 g Topical TID PRN Mansy, Jan A, MD   2 g at 01/30/21 0930  . flecainide (TAMBOCOR) tablet 50 mg  50 mg Oral BID Pokhrel, Laxman, MD   50 mg at 01/30/21 0003  . levothyroxine (SYNTHROID) tablet 25 mcg  25 mcg Oral Q0600 Flora Lipps, MD   25 mcg at 01/30/21 0524  . lisinopril (ZESTRIL) tablet 40 mg  40 mg Oral Daily Pokhrel, Laxman, MD   40 mg at 01/30/21 5188  . loratadine (CLARITIN) tablet 10 mg  10 mg Oral Daily Jennye Boroughs, MD      . ondansetron [facility]) tablet 4 mg  4 mg Oral Q6H PRN Pokhrel, Laxman, MD       Or  . ondansetron (ZOFRAN) injection 4 mg  4 mg Intravenous Q6H PRN Pokhrel, Laxman, MD      . oxyCODONE-acetaminophen (PERCOCET) 7.5-325 MG per tablet 1 tablet  1 tablet Oral Q8H PRN Pokhrel, Laxman, MD   0.5 tablet at 01/29/21 2330  . pantoprazole (PROTONIX) injection 40 mg  40 mg Intravenous Q12H Pokhrel, Laxman, MD   40 mg at 01/30/21 0926  . polyethylene glycol (MIRALAX / GLYCOLAX) packet 17 g  17 g Oral Daily PRN Pokhrel, Laxman, MD      . psyllium (HYDROCIL/METAMUCIL) 1 packet  1 packet Oral Daily Pokhrel, Laxman, MD      . sertraline (ZOLOFT) tablet 50 mg  50 mg Oral Daily Pokhrel, Laxman, MD   50 mg at 01/30/21  7026  . sodium chloride (OCEAN) 0.65 % nasal spray 1 spray  1 spray Each Nare PRN Jennye Boroughs, MD      . traZODone (DESYREL) tablet 50 mg  50 mg Oral QHS Pokhrel, Laxman, MD   50 mg at 01/29/21 2330    Allergies as of 01/29/2021 - Review Complete 01/29/2021  Allergen Reaction Noted  . Benadryl  [diphenhydramine hcl (sleep)] Palpitations 01/10/2015  . Cetirizine Palpitations 01/10/2015  . Lasix [furosemide] Rash 01/10/2015  . Levaquin [levofloxacin in d5w] Other (See Comments) 01/10/2015  . Meloxicam Rash 01/10/2015  . Soy allergy Hives and Nausea And Vomiting 06/16/2016  . Sulfa antibiotics Rash 01/11/2015     Review of Systems:    All systems reviewed and negative except where noted in HPI.  Review of Systems  Constitutional: Negative for chills and fever.  HENT: Positive for hearing loss.   Respiratory: Negative for cough.   Cardiovascular: Negative for chest pain.  Gastrointestinal: Negative for abdominal pain, blood in stool, constipation, diarrhea, melena, nausea and vomiting.  Musculoskeletal: Positive for joint pain.  Skin: Negative for itching and rash.  Neurological: Positive for speech change.  Psychiatric/Behavioral: Negative for substance abuse.  All other systems reviewed and are negative.    Physical Exam:  Vital signs in last 24 hours: Temp:  [97.7 F (36.5 C)-98.6 F (37 C)] 98.6 F (37 C) (05/26 0748) Pulse Rate:  [70-78] 72 (05/26 0748) Resp:  [14-23] 18 (05/26 0748) BP: (100-146)/(60-78) 146/74 (05/26 0748) SpO2:  [97 %-100 %] 100 % (05/26 0748) Weight:  [80.7 kg] 80.7 kg (05/25 1704) Last BM Date: 01/29/21 General:   Pleasant in NAD Head:  Normocephalic and atraumatic. Eyes:   No icterus.   Conjunctiva pink. Mouth: Mucosa pink moist, no lesions. Neck:  Supple; no masses felt Lungs:  No respiratory distress Heart:  RRR Abdomen:   Flat, soft, nondistended, nontender Msk:  Trace edema Neurologic:  Alert and  oriented x4;  Cranial nerves II-XII intact.  Skin:  Warm, dry, pink without significant lesions or rashes. Psych:  Alert and cooperative. Normal affect.  LAB RESULTS: Recent Labs    01/29/21 1711 01/30/21 0052  WBC 7.8 7.0  HGB 8.7* 8.6*  HCT 27.3* 26.9*  PLT 269 243   BMET Recent Labs    01/29/21 1711 01/30/21 0052  NA  136 134*  K 4.2 3.3*  CL 100 101  CO2 26 25  GLUCOSE 110* 134*  BUN 23 24*  CREATININE 0.88 0.97  CALCIUM 8.5* 8.6*   LFT Recent Labs    01/29/21 1711  PROT 6.0*  ALBUMIN 3.3*  AST 22  ALT 16  ALKPHOS 55  BILITOT 0.6   PT/INR Recent Labs    01/29/21 1711  LABPROT 14.5  INR 1.1    STUDIES: CT HEAD WO CONTRAST  Result Date: 01/29/2021 CLINICAL DATA:  75 year old female with neurologic deficit. EXAM: CT HEAD WITHOUT CONTRAST TECHNIQUE: Contiguous axial images were obtained from the base of the skull through the vertex without intravenous contrast. COMPARISON:  Head CT dated 04/20/2016. FINDINGS: Brain: Moderate age-related atrophy and chronic microvascular ischemic changes. There is no acute intracranial hemorrhage. No mass effect or midline shift. Bilateral hemispheric low attenuating subdural collections may represent dilated subdural spaces due to volume loss or old hygroma. Vascular: No hyperdense vessel or unexpected calcification. Skull: Normal. Negative for fracture or focal lesion. Sinuses/Orbits: There is partial opacification. Of the right sphenoid sinus the remainder of the visualized paranasal sinuses and mastoid air cells  are clear. No air-fluid level. Other: None IMPRESSION: 1. No acute intracranial pathology. 2. Moderate age-related atrophy and chronic microvascular ischemic changes. Electronically Signed   By: Anner Crete M.D.   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 HFpEF, a. Fib, and bioprosthetic aortic valve replacement who presented with dizziness and stuttering who incidentally was found to have lower hemoglobin to 8.7 from baseline around 10. Differential includes anemia of chronic disease and avm's. Other explanations for her initial symptoms should be investigated including CO2 retention due to COPD. Less likely to be malignancy or PUD given no overt GI bleeding and relatively recent  EGD/Colonoscopy. Given high procedural risk, recommend iron therapy and outpatient trend of labs.  - no plans for endoscopic intervention given comorbidites and no overt GI bleeding - can advance diet - can restart blood thinner - restart iron and give dose of IV iron - can follow-up in GI clinic for further discussions. Patient agrees to this plan  Please call with any questions or concerns.  Raylene Miyamoto MD, MPH [place]

## 2021-01-30 NOTE — Plan of Care (Signed)

## 2021-01-30 NOTE — Progress Notes (Signed)
Patient is A & O and able to make needs known. AVS reviewed with patient who verbalized understanding via teach back re medication, follow up appointments, signs and symptoms to notify MD as well as limitations and restrictions. Patient granddaughter will transport home via private vehicle.

## 2021-01-30 NOTE — TOC Progression Note (Signed)
Transition of Care [facility]) - Progression Note    Patient Details  Name: [name] MRN: [id number] Date of Birth: 10/26/1945  Transition of [facility]) CM/SW [place], RN Phone Number: 01/30/2021, 2:30 PM  Clinical Narrative:   TOC in to see patient re: discharge planning.  Patient lives at home with her granddaughter, who she states can assist her if needed.  Patient does not have home health, but does have oxygen at home with no complaints about vendor.  Patient has no concerns about getting to appointments or getting medications.  TOC contact information given, TOC to follow for needs.         Expected Discharge Plan and Services                                                 Social Determinants of Health (SDOH) Interventions    Readmission Risk Interventions No flowsheet data found.

## 2021-01-30 NOTE — Discharge Summary (Signed)
Physician Discharge Summary  [name] ZOX:[id number] DOB: [birth year] DOA: 01/29/2021  PCP: [name], MD  Admit date: 01/29/2021 Discharge date: 01/30/2021  Discharge disposition: Home   Recommendations for Outpatient Follow-Up:   Follow-up with PCP in 1 week  Discharge Diagnosis:   Principal Problem:   Symptomatic anemia Active Problems:   Essential hypertension   H/O: CVA (cerebrovascular accident)   GAD (generalized anxiety disorder)   Persistent atrial fibrillation ([place])   S/P placement of cardiac pacemaker    Discharge Condition: Stable.  Diet recommendation:  Diet Order            Diet Heart Room service appropriate? Yes; Fluid consistency: Thin  Diet effective now           Diet - low sodium heart healthy                   Code Status: Full Code     Hospital Course:   [name] is a 75 y.o. female with past medical history of atrial fibrillation on Eliquis, chronic diastolic CHF, COPD, chronic hypoxic respiratory failure on 2 L/min oxygen, hypertension, who presented to the hospital because of dizziness and generalized weakness.  She also complained of increased stuttering above her baseline.  Stool for occult blood was positive.  She was admitted to the hospital for symptomatic anemia and occult GI bleeding.  H&H remained stable and she did not require any blood transfusion.  She was seen in consultation by the gastroenterologist, Dr. Haig Prophet.  He recommended outpatient follow-up for further evaluation. IV iron was recommended but she refused.  She said Dr. Haig Prophet told her to increase her oral iron and it will improve her hemoglobin.  She prefers to have oral iron increased from daily dosing to twice a day.  She also had hypokalemia that was repleted.  She did not want to repeat potassium level because she said she was already taking potassium supplements at home.  She feels better and wants to be discharged home  today.     Discharge Exam:    Vitals:   01/29/21 2309 01/30/21 0018 01/30/21 0324 01/30/21 0748  BP: 100/61 120/66 (!) 142/68 (!) 146/74  Pulse: 73 70 78 72  Resp: 20 18 16 18   Temp: 98.1 F (36.7 C)  97.7 F (36.5 C) 98.6 F (37 C)  TempSrc: Oral  Oral Oral  SpO2: 100% 100% 99% 100%  Weight:      Height:         GEN: NAD SKIN: No rash EYES: EOMI ENT: MMM, b/l hearing impairment CV: RRR PULM: CTA B ABD: soft, obese, NT, +BS CNS: AAO x 3, non focal EXT: No edema or tenderness   The results of significant diagnostics from this hospitalization (including imaging, microbiology, ancillary and laboratory) are listed below for reference.     Procedures and Diagnostic Studies:   CT HEAD WO CONTRAST  Result Date: 01/29/2021 CLINICAL DATA:  75 year old female with neurologic deficit. EXAM: CT HEAD WITHOUT CONTRAST TECHNIQUE: Contiguous axial images were obtained from the base of the skull through the vertex without intravenous contrast. COMPARISON:  Head CT dated 04/20/2016. FINDINGS: Brain: Moderate age-related atrophy and chronic microvascular ischemic changes. There is no acute intracranial hemorrhage. No mass effect or midline shift. Bilateral hemispheric low attenuating subdural collections may represent dilated subdural spaces due to volume loss or old hygroma. Vascular: No hyperdense vessel or unexpected calcification. Skull: Normal. Negative for fracture or focal lesion. Sinuses/Orbits:  There is partial opacification. Of the right sphenoid sinus the remainder of the visualized paranasal sinuses and mastoid air cells are clear. No air-fluid level. Other: None IMPRESSION: 1. No acute intracranial pathology. 2. Moderate age-related atrophy and chronic microvascular ischemic changes. Electronically Signed   By: Anner Crete M.D.   On: 01/29/2021 17:48     Labs:   Basic Metabolic Panel: Recent Labs  Lab 01/29/21 1711 01/30/21 0052  NA 136 134*  K 4.2 3.3*  CL 100  101  CO2 26 25  GLUCOSE 110* 134*  BUN 23 24*  CREATININE 0.88 0.97  CALCIUM 8.5* 8.6*  MG  --  2.1  PHOS  --  3.5   GFR Estimated Creatinine Clearance: 46.3 mL/min (by C-G formula based on SCr of 0.97 mg/dL). Liver Function Tests: Recent Labs  Lab 01/29/21 1711  AST 22  ALT 16  ALKPHOS 55  BILITOT 0.6  PROT 6.0*  ALBUMIN 3.3*   No results for input(s): LIPASE, AMYLASE in the last 168 hours. No results for input(s): AMMONIA in the last 168 hours. Coagulation profile Recent Labs  Lab 01/29/21 1711  INR 1.1    CBC: Recent Labs  Lab 01/29/21 1711 01/30/21 0052  WBC 7.8 7.0  NEUTROABS 5.7  --   HGB 8.7* 8.6*  HCT 27.3* 26.9*  MCV 91.0 89.4  PLT 269 243   Cardiac Enzymes: No results for input(s): CKTOTAL, CKMB, CKMBINDEX, TROPONINI in the last 168 hours. BNP: Invalid input(s): POCBNP CBG: No results for input(s): GLUCAP in the last 168 hours. D-Dimer No results for input(s): DDIMER in the last 72 hours. Hgb A1c No results for input(s): HGBA1C in the last 72 hours. Lipid Profile No results for input(s): CHOL, HDL, LDLCALC, TRIG, CHOLHDL, LDLDIRECT in the last 72 hours. Thyroid function studies No results for input(s): TSH, T4TOTAL, T3FREE, THYROIDAB in the last 72 hours.  Invalid input(s): FREET3 Anemia work up Recent Labs    01/29/21 1711 01/30/21 0052  FERRITIN  --  21  TIBC 409  --   IRON 38  --    Microbiology Recent Results (from the past 240 hour(s))  Resp Panel by RT-PCR (Flu A&B, Covid) Nasopharyngeal Swab     Status: None   Collection Time: 01/29/21  5:11 PM   Specimen: Nasopharyngeal Swab; Nasopharyngeal(NP) swabs in vial transport medium  Result Value Ref Range Status   SARS Coronavirus 2 by RT PCR NEGATIVE NEGATIVE Final    Comment: (NOTE) SARS-CoV-2 target nucleic acids are NOT DETECTED.  The SARS-CoV-2 RNA is generally detectable in upper respiratory specimens during the acute phase of infection. The lowest concentration of  SARS-CoV-2 viral copies this assay can detect is 138 copies/mL. A negative result does not preclude SARS-Cov-2 infection and should not be used as the sole basis for treatment or other patient management decisions. A negative result may occur with  improper specimen collection/handling, submission of specimen other than nasopharyngeal swab, presence of viral mutation(s) within the areas targeted by this assay, and inadequate number of viral copies(<138 copies/mL). A negative result must be combined with clinical observations, patient history, and epidemiological information. The expected result is Negative.  Fact Sheet for Patients:  EntrepreneurPulse.com.au  Fact Sheet for Healthcare Providers:  IncredibleEmployment.be  This test is no t yet approved or cleared by the [place] FDA and  has been authorized for detection and/or diagnosis of SARS-CoV-2 by FDA under an Emergency Use Authorization (EUA). This EUA will remain  in effect (meaning this test  can be used) for the duration of the COVID-19 declaration under Section 564(b)(1) of the Act, 21 U.S.C.section 360bbb-3(b)(1), unless the authorization is terminated  or revoked sooner.       Influenza A by PCR NEGATIVE NEGATIVE Final   Influenza B by PCR NEGATIVE NEGATIVE Final    Comment: (NOTE) The Xpert Xpress SARS-CoV-2/FLU/RSV plus assay is intended as an aid in the diagnosis of influenza from Nasopharyngeal swab specimens and should not be used as a sole basis for treatment. Nasal washings and aspirates are unacceptable for Xpert Xpress SARS-CoV-2/FLU/RSV testing.  Fact Sheet for Patients: EntrepreneurPulse.com.au  Fact Sheet for Healthcare Providers: IncredibleEmployment.be  This test is not yet approved or cleared by the [place] FDA and has been authorized for detection and/or diagnosis of SARS-CoV-2 by FDA under an Emergency Use  Authorization (EUA). This EUA will remain in effect (meaning this test can be used) for the duration of the COVID-19 declaration under Section 564(b)(1) of the Act, 21 U.S.C. section 360bbb-3(b)(1), unless the authorization is terminated or revoked.  Performed at [facility], [address] [place], [place] [address]      Discharge Instructions:   Discharge Instructions    Diet - low sodium heart healthy   Complete by: As directed    Increase activity slowly   Complete by: As directed      Allergies as of 01/30/2021      Reactions   Benadryl [diphenhydramine Hcl (sleep)] Palpitations   Cetirizine Palpitations   Lasix [furosemide] Rash   Levaquin [levofloxacin In D5w] Other (See Comments)   Reaction:  Fatigue and muscle soreness   Meloxicam Rash   Soy Allergy Hives, Nausea And Vomiting   Sulfa Antibiotics Rash      Medication List    TAKE these medications   acetaminophen 500 MG tablet Commonly known as: TYLENOL Take 500 mg by mouth every 4 (four) hours as needed for moderate pain or headache.   albuterol (2.5 MG/3ML) 0.083% nebulizer solution Commonly known as: PROVENTIL Take 3 mLs (2.5 mg total) by nebulization every 6 (six) hours as needed for wheezing or shortness of breath.   ALPRAZolam 0.25 MG tablet Commonly known as: XANAX Take 1 tablet (0.25 mg total) by mouth 2 (two) times daily as needed for anxiety.   atorvastatin 40 MG tablet Commonly known as: LIPITOR 1 tab po qhs   bumetanide 1 MG tablet Commonly known as: BUMEX Take 2 tablets (2 mg total) by mouth 2 (two) times daily. What changed: Another medication with the same name was removed. Continue taking this medication, and follow the directions you see here.   CALCIUM 600+D PO Take 1 tablet by mouth daily.   CoQ10 200 MG Caps Take 200 mg by mouth daily.   diclofenac Sodium 1 % Gel Commonly known as: VOLTAREN SMARTSIG:Gram(s) Topical Twice Daily   Eliquis 5 MG Tabs tablet Generic  drug: apixaban Take 1 tablet by mouth twice daily. What changed: how much to take   ferrous sulfate 325 (65 FE) MG tablet Take 2 tablets (650 mg total) by mouth 2 (two) times daily with a meal. What changed: when to take this   flecainide 50 MG tablet Commonly known as: TAMBOCOR Take 1 tablet (50 mg total) by mouth 2 (two) times daily. PLEASE CONTACT OFFICE [phone] TO SCHEDULE APPOINTMENT FOR FUTURE REFILLS   levothyroxine 25 MCG tablet Commonly known as: SYNTHROID Take 1 tablet (25 mcg total) by mouth daily before breakfast.   lisinopril 40 MG tablet  Commonly known as: ZESTRIL Take 1 tablet (40 mg total) by mouth daily.   loratadine 10 MG tablet Commonly known as: CLARITIN Take 10 mg by mouth daily.   mupirocin nasal ointment 2 % Commonly known as: BACTROBAN Use one-half of tube in each nostril twice daily for five (5) days. After application, press sides of nose together and gently massage.   oxyCODONE-acetaminophen 7.5-325 MG tablet Commonly known as: Percocet Take half to one tab po qd prn for pain What changed:   how much to take  how to take this   pantoprazole 40 MG tablet Commonly known as: PROTONIX Take 1 tablet (40 mg total) by mouth daily.   potassium chloride 10 MEQ tablet Commonly known as: KLOR-CON Take 4 tablets (40 mEq total) by mouth 2 (two) times daily.   psyllium 58.6 % packet Commonly known as: METAMUCIL Take 1 packet by mouth daily. Mix in water or juice.   sertraline 50 MG tablet Commonly known as: ZOLOFT Take 1 tablet (50 mg total) by mouth daily.   traZODone 50 MG tablet Commonly known as: DESYREL Take 1 tablet by mouth at bedtime.   Trelegy Ellipta 100-62.5-25 MCG/INH Aepb Generic drug: Fluticasone-Umeclidin-Vilant Inhale 1 puff by mouth into the lungs daily.         Time coordinating discharge: 34 minutes  Signed:  Jarvis Sawa  [facility] 01/30/2021, 3:20 PM   Pager on [email or web address]. If 7PM-7AM, please  contact night-coverage at [email or web address]

## 2021-01-30 NOTE — Plan of Care (Signed)

## 2021-01-30 NOTE — Progress Notes (Addendum)
Progress Note    [name]  QQI:[id number] DOB: 15-Nov-1945  DOA: 01/29/2021 PCP: [name], MD      Brief Narrative:    Medical records reviewed and are as summarized below:  [name] is a 75 y.o. female  with past medical history of atrial fibrillation on Eliquis, chronic diastolic CHF, COPD, chronic hypoxic respiratory failure on 2 L/min oxygen, hypertension, who presented to the hospital because of dizziness and generalized weakness.  She also complained of increased stuttering above her baseline.      Assessment/Plan:   Principal Problem:   Symptomatic anemia Active Problems:   Essential hypertension   H/O: CVA (cerebrovascular accident)   GAD (generalized anxiety disorder)   Persistent atrial fibrillation ([name])   S/P placement of cardiac pacemaker    Body mass index is 37.2 kg/m. (morbid obesity)  Symptomatic anemia, positive heme stools: H&H is stable.  No indication for blood transfusion.  Continue IV Protonix.  Discontinue IV fluids.  No plan for endoscopy today.  Discussed with Dr. Haig Prophet, gastroenterologist.  Persistent atrial fibrillation: Eliquis has been held.  Continue flecainide  Hypertension: Continue lisinopril  Hypokalemia: Replete potassium and monitor levels.  COPD, chronic hypoxic respiratory failure: Continue bronchodilators on 2 L/min oxygen via nasal collar.  History of AAA, history of aortic valve replacement, history of stroke, generalized anxiety disorder   Diet Order            Diet Heart Room service appropriate? Yes; Fluid consistency: Thin  Diet effective now                    Consultants:  Gastroenterologist  Procedures:  None    Medications:   . atorvastatin  40 mg Oral Daily  . bumetanide  2 mg Oral BID  . calcium-vitamin D  1 tablet Oral Daily  . flecainide  50 mg Oral BID  . Fluticasone-Umeclidin-Vilant  1 Inhaler Inhalation Daily  . levothyroxine  25 mcg Oral Q0600  . lisinopril   40 mg Oral Daily  . loratadine  10 mg Oral Daily  . pantoprazole (PROTONIX) IV  40 mg Intravenous Q12H  . psyllium  1 packet Oral Daily  . sertraline  50 mg Oral Daily  . traZODone  50 mg Oral QHS   Continuous Infusions:    Anti-infectives (From admission, onward)   None             Family Communication/Anticipated D/C date and plan/Code Status   DVT prophylaxis: SCDs Start: 01/29/21 2049     Code Status: Full Code  Family Communication: None Disposition Plan:    Status is: Observation  The patient will require care spanning > 2 midnights and should be moved to inpatient because: Inpatient level of care appropriate due to severity of illness  Dispo: The patient is from: Home              Anticipated d/c is to: Home              Patient currently is not medically stable to d/c.   Difficult to place patient No           Subjective:   Interval events noted.  No shortness of breath, dizziness or chest pain.  No hematemesis, abdominal pain or bloody stools.  Objective:    Vitals:   01/29/21 2309 01/30/21 0018 01/30/21 0324 01/30/21 0748  BP: 100/61 120/66 (!) 142/68 (!) 146/74  Pulse: 73 70 78 72  Resp: 20 18 16 18   Temp: 98.1 F (36.7 C)  97.7 F (36.5 C) 98.6 F (37 C)  TempSrc: Oral  Oral Oral  SpO2: 100% 100% 99% 100%  Weight:      Height:       No data found.   Intake/Output Summary (Last 24 hours) at 01/30/2021 1256 Last data filed at 01/30/2021 0330 Gross per 24 hour  Intake 408 ml  Output 0 ml  Net 408 ml   Filed Weights   01/29/21 1704  Weight: 80.7 kg    Exam:  GEN: NAD SKIN: No rash EYES: EOMI ENT: MMM, b/l hearing impairment CV: RRR PULM: CTA B ABD: soft, obese, NT, +BS CNS: AAO x 3, non focal EXT: No edema or tenderness      Data Reviewed:   I have personally reviewed following labs and imaging studies:  Labs: Labs show the following:   Basic Metabolic Panel: Recent Labs  Lab 01/29/21 1711  01/30/21 0052  NA 136 134*  K 4.2 3.3*  CL 100 101  CO2 26 25  GLUCOSE 110* 134*  BUN 23 24*  CREATININE 0.88 0.97  CALCIUM 8.5* 8.6*  MG  --  2.1  PHOS  --  3.5   GFR Estimated Creatinine Clearance: 46.3 mL/min (by C-G formula based on SCr of 0.97 mg/dL). Liver Function Tests: Recent Labs  Lab 01/29/21 1711  AST 22  ALT 16  ALKPHOS 55  BILITOT 0.6  PROT 6.0*  ALBUMIN 3.3*   No results for input(s): LIPASE, AMYLASE in the last 168 hours. No results for input(s): AMMONIA in the last 168 hours. Coagulation profile Recent Labs  Lab 01/29/21 1711  INR 1.1    CBC: Recent Labs  Lab 01/29/21 1711 01/30/21 0052  WBC 7.8 7.0  NEUTROABS 5.7  --   HGB 8.7* 8.6*  HCT 27.3* 26.9*  MCV 91.0 89.4  PLT 269 243   Cardiac Enzymes: No results for input(s): CKTOTAL, CKMB, CKMBINDEX, TROPONINI in the last 168 hours. BNP (last 3 results) No results for input(s): PROBNP in the last 8760 hours. CBG: No results for input(s): GLUCAP in the last 168 hours. D-Dimer: No results for input(s): DDIMER in the last 72 hours. Hgb A1c: No results for input(s): HGBA1C in the last 72 hours. Lipid Profile: No results for input(s): CHOL, HDL, LDLCALC, TRIG, CHOLHDL, LDLDIRECT in the last 72 hours. Thyroid function studies: No results for input(s): TSH, T4TOTAL, T3FREE, THYROIDAB in the last 72 hours.  Invalid input(s): FREET3 Anemia work up: Recent Labs    01/29/21 1711 01/30/21 0052  FERRITIN  --  21  TIBC 409  --   IRON 38  --    Sepsis Labs: Recent Labs  Lab 01/29/21 1711 01/30/21 0052  WBC 7.8 7.0    Microbiology Recent Results (from the past 240 hour(s))  Resp Panel by RT-PCR (Flu A&B, Covid) Nasopharyngeal Swab     Status: None   Collection Time: 01/29/21  5:11 PM   Specimen: Nasopharyngeal Swab; Nasopharyngeal(NP) swabs in vial transport medium  Result Value Ref Range Status   SARS Coronavirus 2 by RT PCR NEGATIVE NEGATIVE Final    Comment: (NOTE) SARS-CoV-2  target nucleic acids are NOT DETECTED.  The SARS-CoV-2 RNA is generally detectable in upper respiratory specimens during the acute phase of infection. The lowest concentration of SARS-CoV-2 viral copies this assay can detect is 138 copies/mL. A negative result does not preclude SARS-Cov-2 infection and should not be used as the sole  basis for treatment or other patient management decisions. A negative result may occur with  improper specimen collection/handling, submission of specimen other than nasopharyngeal swab, presence of viral mutation(s) within the areas targeted by this assay, and inadequate number of viral copies(<138 copies/mL). A negative result must be combined with clinical observations, patient history, and epidemiological information. The expected result is Negative.  Fact Sheet for Patients:  EntrepreneurPulse.com.au  Fact Sheet for Healthcare Providers:  IncredibleEmployment.be  This test is no t yet approved or cleared by the [place] FDA and  has been authorized for detection and/or diagnosis of SARS-CoV-2 by FDA under an Emergency Use Authorization (EUA). This EUA will remain  in effect (meaning this test can be used) for the duration of the COVID-19 declaration under Section 564(b)(1) of the Act, 21 U.S.C.section 360bbb-3(b)(1), unless the authorization is terminated  or revoked sooner.       Influenza A by PCR NEGATIVE NEGATIVE Final   Influenza B by PCR NEGATIVE NEGATIVE Final    Comment: (NOTE) The Xpert Xpress SARS-CoV-2/FLU/RSV plus assay is intended as an aid in the diagnosis of influenza from Nasopharyngeal swab specimens and should not be used as a sole basis for treatment. Nasal washings and aspirates are unacceptable for Xpert Xpress SARS-CoV-2/FLU/RSV testing.  Fact Sheet for Patients: EntrepreneurPulse.com.au  Fact Sheet for Healthcare  Providers: IncredibleEmployment.be  This test is not yet approved or cleared by the [place] FDA and has been authorized for detection and/or diagnosis of SARS-CoV-2 by FDA under an Emergency Use Authorization (EUA). This EUA will remain in effect (meaning this test can be used) for the duration of the COVID-19 declaration under Section 564(b)(1) of the Act, 21 U.S.C. section 360bbb-3(b)(1), unless the authorization is terminated or revoked.  Performed at [facility], [address], [place], [place] [address]     Procedures and diagnostic studies:  CT HEAD WO CONTRAST  Result Date: 01/29/2021 CLINICAL DATA:  75 year old female with neurologic deficit. EXAM: CT HEAD WITHOUT CONTRAST TECHNIQUE: Contiguous axial images were obtained from the base of the skull through the vertex without intravenous contrast. COMPARISON:  Head CT dated 04/20/2016. FINDINGS: Brain: Moderate age-related atrophy and chronic microvascular ischemic changes. There is no acute intracranial hemorrhage. No mass effect or midline shift. Bilateral hemispheric low attenuating subdural collections may represent dilated subdural spaces due to volume loss or old hygroma. Vascular: No hyperdense vessel or unexpected calcification. Skull: Normal. Negative for fracture or focal lesion. Sinuses/Orbits: There is partial opacification. Of the right sphenoid sinus the remainder of the visualized paranasal sinuses and mastoid air cells are clear. No air-fluid level. Other: None IMPRESSION: 1. No acute intracranial pathology. 2. Moderate age-related atrophy and chronic microvascular ischemic changes. Electronically Signed   By: Anner Crete M.D.   On: 01/29/2021 17:48               LOS: 0 days   Sammie Denner  [facility]   Pager on [email or web address]. If 7PM-7AM, please contact night-coverage at [email or web address]     01/30/2021, 12:56 PM

## 2021-01-31 NOTE — Telephone Encounter (Signed)
done

## 2021-02-10 ENCOUNTER — Other Ambulatory Visit: Payer: Self-pay

## 2021-02-10 DIAGNOSIS — F321 Major depressive disorder, single episode, moderate: Secondary | ICD-10-CM

## 2021-02-10 MED ORDER — SERTRALINE HCL 50 MG PO TABS: 50.0000 mg | ORAL_TABLET | Freq: Every day | ORAL | 1 refills | Status: DC

## 2021-02-12 ENCOUNTER — Telehealth: Payer: Self-pay | Admitting: Internal Medicine

## 2021-02-12 NOTE — Chronic Care Management (AMB) (Signed)
  Chronic Care Management   Outreach Note  02/12/2021 Name: [name] MRN: [id number] DOB: [birth year]  Referred by: [name], MD Reason for referral : No chief complaint on file.   An unsuccessful telephone outreach was attempted today. The patient was referred to the pharmacist for assistance with care management and care coordination.   Follow Up Plan:   [name] Upstream Scheduler

## 2021-02-17 ENCOUNTER — Telehealth: Payer: Self-pay

## 2021-02-17 ENCOUNTER — Other Ambulatory Visit: Payer: Self-pay | Admitting: Adult Health

## 2021-02-17 DIAGNOSIS — J449 Chronic obstructive pulmonary disease, unspecified: Secondary | ICD-10-CM

## 2021-02-17 MED ORDER — PREDNISONE 10 MG PO TABS: 10.0000 mg | ORAL_TABLET | Freq: Every day | ORAL | 0 refills | Status: DC

## 2021-02-17 MED ORDER — AZITHROMYCIN 250 MG PO TABS: ORAL_TABLET | ORAL | 0 refills | Status: DC

## 2021-02-17 NOTE — Telephone Encounter (Signed)
Completed medical records for [facility], faxed to [phone]

## 2021-02-17 NOTE — Telephone Encounter (Signed)
Pt called that she pt tested positive for covid,sinusitis,fever ,coughing and shortness of breath pt advised that we send zpak and prednisone and also advised if symptoms getting worse go to ED

## 2021-02-19 ENCOUNTER — Ambulatory Visit: Payer: Medicare Other | Admitting: Nurse Practitioner

## 2021-02-20 ENCOUNTER — Other Ambulatory Visit: Payer: Self-pay

## 2021-02-20 ENCOUNTER — Telehealth: Payer: Self-pay

## 2021-02-20 MED ORDER — TRAZODONE HCL 50 MG PO TABS: 50.0000 mg | ORAL_TABLET | Freq: Every day | ORAL | 0 refills | Status: DC

## 2021-02-20 NOTE — Telephone Encounter (Signed)
Lmom to check on pt how is feeling due covid positive w

## 2021-03-02 DIAGNOSIS — J449 Chronic obstructive pulmonary disease, unspecified: Secondary | ICD-10-CM | POA: Diagnosis not present

## 2021-03-03 DIAGNOSIS — Z950 Presence of cardiac pacemaker: Secondary | ICD-10-CM | POA: Diagnosis not present

## 2021-03-06 ENCOUNTER — Other Ambulatory Visit: Payer: Self-pay

## 2021-03-06 MED ORDER — ATORVASTATIN CALCIUM 40 MG PO TABS: ORAL_TABLET | ORAL | 1 refills | Status: DC

## 2021-03-07 ENCOUNTER — Other Ambulatory Visit: Payer: Self-pay | Admitting: Nurse Practitioner

## 2021-03-07 ENCOUNTER — Telehealth: Payer: Self-pay

## 2021-03-07 DIAGNOSIS — Z950 Presence of cardiac pacemaker: Secondary | ICD-10-CM | POA: Diagnosis not present

## 2021-03-07 DIAGNOSIS — G8929 Other chronic pain: Secondary | ICD-10-CM

## 2021-03-07 MED ORDER — OXYCODONE-ACETAMINOPHEN 7.5-325 MG PO TABS: 0.5000 | ORAL_TABLET | Freq: Every day | ORAL | 0 refills | Status: DC | PRN

## 2021-03-07 NOTE — Telephone Encounter (Signed)
Pt called for refill of her oxycodone-acetaminophen

## 2021-03-11 ENCOUNTER — Ambulatory Visit: Payer: Medicare Other | Admitting: Internal Medicine

## 2021-03-11 ENCOUNTER — Encounter: Payer: Self-pay | Admitting: Internal Medicine

## 2021-03-11 ENCOUNTER — Other Ambulatory Visit: Payer: Self-pay

## 2021-03-11 ENCOUNTER — Telehealth: Payer: Self-pay

## 2021-03-11 DIAGNOSIS — D638 Anemia in other chronic diseases classified elsewhere: Secondary | ICD-10-CM | POA: Diagnosis not present

## 2021-03-11 DIAGNOSIS — R0609 Other forms of dyspnea: Secondary | ICD-10-CM

## 2021-03-11 DIAGNOSIS — I700 Atherosclerosis of aorta: Secondary | ICD-10-CM

## 2021-03-11 DIAGNOSIS — R0600 Dyspnea, unspecified: Secondary | ICD-10-CM

## 2021-03-11 DIAGNOSIS — I10 Essential (primary) hypertension: Secondary | ICD-10-CM

## 2021-03-11 DIAGNOSIS — R195 Other fecal abnormalities: Secondary | ICD-10-CM | POA: Diagnosis not present

## 2021-03-11 NOTE — Progress Notes (Signed)
[facility] [place], [place] [address]  Internal MEDICINE  Office Visit Note  Patient Name: [name]  [id number]  [id number]  Date of Service: 03/12/2021     Chief Complaint  Patient presents with   Hospitalization Follow-up    Pt walking slow, SOB walking short distances, wants to see GI, upper and lower abd pain, gas, wants blood work   Hyperlipidemia   Hypertension   COPD   Quality Metric Gaps    Shingrix, covid booster     HPI Pt is here for recent hospital follow up.  She was admitted with ongoing symptoms of fatigue shortness of breath and was found to be anemic however during her hospital admission her hemoglobin stayed stable and she was evaluated by Dr. Haig Prophet and it was recommended to get iron infusion however patient refused and wanted to go home. Patient also found to be hypokalemic she is taking potassium supplement and is willing to get repeat lab work done her other medical problems include atrial fibrillation on Eliquis, chronic diastolic congestive heart failure, COPD, chronic hypoxic respiratory failure on 2 L oxygen, hypertension in the hospital she did have stools for occult blood positive she is high risk because of anticoagulation therapy with Eliquis She has h/o problematic paroxysmal atrial fibrillation, status post 5 cardioversions more recently 01/15/2020. The patient underwent successful AV node ablation with dual-chamber pacemaker implantation on 02/07/2020. The patient has history of aortic stenosis, status post AVR and aortic repair root in 2008  Current Medication: Outpatient Encounter Medications as of 03/11/2021  Medication Sig Note   acetaminophen (TYLENOL) 500 MG tablet Take 500 mg by mouth every 4 (four) hours as needed for moderate pain or headache.     ALPRAZolam (XANAX) 0.25 MG tablet Take 1 tablet (0.25 mg total) by mouth 2 (two) times daily as needed for anxiety.    atorvastatin (LIPITOR) 40 MG tablet 1 tab po qhs     Calcium Carbonate-Vitamin D (CALCIUM 600+D PO) Take 1 tablet by mouth daily.    Coenzyme Q10 (COQ10) 200 MG CAPS Take 200 mg by mouth daily.    diclofenac Sodium (VOLTAREN) 1 % GEL SMARTSIG:Gram(s) Topical Twice Daily    ELIQUIS 5 MG TABS tablet Take 1 tablet by mouth twice daily. (Patient taking differently: Take 5 mg by mouth 2 (two) times daily.)    ferrous sulfate 325 (65 FE) MG tablet Take 2 tablets (650 mg total) by mouth 2 (two) times daily with a meal.    flecainide (TAMBOCOR) 50 MG tablet Take 1 tablet (50 mg total) by mouth 2 (two) times daily. PLEASE CONTACT OFFICE [phone] TO SCHEDULE APPOINTMENT FOR FUTURE REFILLS    levothyroxine (SYNTHROID) 25 MCG tablet Take 1 tablet (25 mcg total) by mouth daily before breakfast.    loratadine (CLARITIN) 10 MG tablet Take 10 mg by mouth daily.    oxyCODONE-acetaminophen (PERCOCET) 7.5-325 MG tablet Take 0.5-1 tablets by mouth daily as needed for severe pain. Take half to one tab po qd prn for pain    pantoprazole (PROTONIX) 40 MG tablet Take 1 tablet (40 mg total) by mouth daily.    potassium chloride (KLOR-CON) 10 MEQ tablet Take 4 tablets (40 mEq total) by mouth 2 (two) times daily.    sertraline (ZOLOFT) 50 MG tablet Take 1 tablet (50 mg total) by mouth daily.    traZODone (DESYREL) 50 MG tablet Take 1 tablet (50 mg total) by mouth at bedtime.    TRELEGY ELLIPTA 100-62.5-25  MCG/INH AEPB Inhale 1 puff by mouth into the lungs daily.    VENTOLIN HFA 108 (90 Base) MCG/ACT inhaler INHALE 2 PUFFS INTO THE LUNGS EVERY 6 HOURS AS NEEDED FOR WHEEZING OR SHORTNESS OF BREATH    bumetanide (BUMEX) 1 MG tablet Take 2 tablets (2 mg total) by mouth 2 (two) times daily.    lisinopril (ZESTRIL) 40 MG tablet Take 1 tablet (40 mg total) by mouth daily. 01/29/2021: Pt had today at 1100   [DISCONTINUED] albuterol (PROVENTIL) (2.5 MG/3ML) 0.083% nebulizer solution Take 3 mLs (2.5 mg total) by nebulization every 6 (six) hours as needed for wheezing or shortness  of breath. (Patient not taking: Reported on 03/11/2021)    [DISCONTINUED] azithromycin (ZITHROMAX) 250 MG tablet Take 1 tab  po daily for 10 days (Patient not taking: Reported on 03/11/2021)    [DISCONTINUED] mupirocin nasal ointment (BACTROBAN) 2 % Use one-half of tube in each nostril twice daily for five (5) days. After application, press sides of nose together and gently massage. (Patient not taking: Reported on 03/11/2021)    [DISCONTINUED] predniSONE (DELTASONE) 10 MG tablet Take 1 tablet (10 mg total) by mouth daily with breakfast. Take 1 tab po 3 x day for 3 days then take 1 tab po 2 x a day for 3 days and then take 1 tab po daily for 3 days (Patient not taking: Reported on 03/11/2021)    [DISCONTINUED] psyllium (METAMUCIL) 58.6 % packet Take 1 packet by mouth daily. Mix in water or juice. (Patient not taking: Reported on 03/11/2021)    No facility-administered encounter medications on file as of 03/11/2021.    Surgical History: Past Surgical History:  Procedure Laterality Date   ABDOMINAL AORTIC ANEURYSM REPAIR  2008   ABDOMINAL HYSTERECTOMY     AORTIC VALVE REPLACEMENT  2008   CARDIAC CATHETERIZATION     [place]   CARDIOVERSION N/A 08/15/2018   Procedure: CARDIOVERSION;  Surgeon: [name], MD;  Location: [facility] ORS;  Service: Cardiovascular;  Laterality: N/A;   CARDIOVERSION N/A 11/14/2018   Procedure: CARDIOVERSION (CATH LAB);  Surgeon: [name], MD;  Location: [facility];  Service: Cardiovascular;  Laterality: N/A;   CARDIOVERSION N/A 06/05/2019   Procedure: CARDIOVERSION;  Surgeon: [name], MD;  Location: [facility] OR[facility];  Service: Cardiovascular;  Laterality: N/A;   CARDIOVERSION N/A 08/14/2019   Procedure: CARDIOVERSION;  Surgeon: [name], MD;  Location: [place] [facility];  Service: Cardiovascular;  Laterality: N/A;   CARDIOVERSION N/A 11/09/2019   Procedure: CARDIOVERSION;  Surgeon: Isaias Cowman, MD;  Location: [facility];  Service: Cardiovascular;  Laterality: N/A;    CARDIOVERSION N/A 01/17/2020   Procedure: CARDIOVERSION;  Surgeon: Isaias Cowman, MD;  Location: [facility] OR[facility];  Service: Cardiovascular;  Laterality: N/A;   CARPAL TUNNEL RELEASE     ELECTROPHYSIOLOGIC STUDY N/A 08/17/2016   Procedure: Cardioversion;  Surgeon: [name], MD;  Location: [facility] OR[facility];  Service: Cardiovascular;  Laterality: N/A;   TUMOR EXCISION Left    x3 (arm)    Medical History: Past Medical History:  Diagnosis Date   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 s/p bioprosthetic valve replacement 2008 at [facility];  b. 01/2015 Echo: EF 60-65%, no rwma, Gr1 DD, mildly dil LA, nl RV fxn.   Atrial fibrillation with RVR ([place]) 01/11/2015   Atrial flutter ([place])    a. 08/2016 s/p DCCV.  Remains on flecainide 50 mg bid.   Basal skull fracture ([name]) 20 yrs ago   CHF (congestive  heart failure) ([place])    Chronic respiratory failure ([name])    COPD (chronic obstructive pulmonary disease) ([place])    a. 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 2008 prior to Aortic aneurysm repair-->nl cors.   History of stress test    a. 10/2015 MV: no ischemia/infarct.   HLD (hyperlipidemia)    HTN (hypertension)    Hypothyroidism    Obesity    PAF (paroxysmal atrial fibrillation) ([name])    a. on Eliquis; b. CHADS2VASc = 3 (HTN, age x 45, female).   Paroxysmal atrial fibrillation ([place]) 01/24/2015   Right upper quadrant abdominal tenderness without rebound tenderness 03/02/2018   S/P ascending aortic aneurysm repair 2008    Family History: Family History  Problem Relation Age of Onset   Stroke Father    Stroke Paternal Grandmother     Social History   Socioeconomic History   Marital status: Single    Spouse name: Not on file   Number of children: Not on file   Years of education: Not on file   Highest education level: Not on file  Occupational History   Not on file   Tobacco Use   Smoking status: Former    Pack years: 0.00    Types: Cigarettes   Smokeless tobacco: Never  Vaping Use   Vaping Use: Never used  Substance and Sexual Activity   Alcohol use: No   Drug use: No   Sexual activity: Never    Birth control/protection: Surgical  Other Topics Concern   Not on file  Social History Narrative   Not on file   Social Determinants of Health   Financial Resource Strain: Not on file  Food Insecurity: Not on file  Transportation Needs: Not on file  Physical Activity: Not on file  Stress: Not on file  Social Connections: Not on file  Intimate Partner Violence: Not on file      Review of Systems  Constitutional:  Positive for fatigue. Negative for fever.  HENT:  Negative for congestion, mouth sores and postnasal drip.   Respiratory:  Positive for shortness of breath. Negative for cough.   Cardiovascular:  Negative for chest pain.  Gastrointestinal:  Positive for abdominal distention.       Bloating   Genitourinary:  Negative for flank pain.  Musculoskeletal:  Positive for gait problem.  Neurological:  Positive for weakness.  Psychiatric/Behavioral:  The patient is nervous/anxious.    Vital Signs: BP 138/70   Pulse 82   Temp 97.9 F (36.6 C)   Resp 16   Ht 4\' 10"  (1.473 m)   Wt 183 lb 12.8 oz (83.4 kg)   SpO2 (!) 81% Comment: room air  BMI 38.41 kg/m    Physical Exam Constitutional:      General: She is not in acute distress.    Appearance: She is well-developed. She is not diaphoretic.  HENT:     Head: Normocephalic and atraumatic.     Mouth/Throat:     Pharynx: No oropharyngeal exudate.  Eyes:     Pupils: Pupils are equal, round, and reactive to light.  Neck:     Thyroid: No thyromegaly.     Vascular: No JVD.     Trachea: No tracheal deviation.  Cardiovascular:     Rate and Rhythm: Normal rate and regular rhythm.  Heart sounds: Normal heart sounds. No murmur heard.   No friction rub. No gallop.  Pulmonary:      Effort: Pulmonary effort is normal. No respiratory distress.     Breath sounds: No wheezing or rales.  Chest:     Chest wall: No tenderness.  Abdominal:     General: Bowel sounds are normal.     Palpations: Abdomen is soft.  Musculoskeletal:        General: Normal range of motion.     Cervical back: Normal range of motion and neck supple.  Lymphadenopathy:     Cervical: No cervical adenopathy.  Skin:    General: Skin is warm and dry.  Neurological:     Mental Status: She is alert and oriented to person, place, and time.     Cranial Nerves: No cranial nerve deficit.  Psychiatric:        Behavior: Behavior normal.        Thought Content: Thought content normal.        Judgment: Judgment normal.      Assessment/Plan: 1. Dyspnea on exertion With seems to be multifactorial, history of atrial fibrillation has pacemaker, recent CT scanning did show cardiomegaly with bilateral pleural effusion patient is on high-dose Lasix and potassium supplement she is also has emphysema and is  on oxygen but hemoglobin in the hospital was low patient needs to see both hematology and GI as soon as possible  2. Anemia in other chronic diseases classified elsewhere She probably will feel better if her hemoglobin is around 10 due to ongoing cardiopulmonary issues we will update labs as soon as possible - Ambulatory referral to Gastroenterology - Ambulatory referral to Hematology / Oncology - CBC with Differential/Platelet - B12 and Folate Panel - Fe+TIBC+Fer  3. Benign hypertension Seems to be under good control we will monitor her potassium level - Basic Metabolic Panel (BMET)  4. Heme positive stool Patient is on Eliquis heme positive stools in the hospital needs to see GI, need to talk to cardiology about her anticoagulation might need to increase her Protonix as well  5. Aortic atherosclerosis ([place]) Will continue on Lipitor as before  General Counseling: Starlina verbalizes understanding of  the findings of todays visit and agrees with plan of treatment. I have discussed any further diagnostic evaluation that may be needed or ordered today. We also reviewed her medications today. she has been encouraged to call the office with any questions or concerns that should arise related to todays visit.  All hospital records are reviewed for continued care  Counseling:  [place] Controlled Substance Database was reviewed by me.  Orders Placed This Encounter  Procedures   Basic Metabolic Panel (BMET)   CBC with Differential/Platelet   B12 and Folate Panel   Fe+TIBC+Fer   Ambulatory referral to Gastroenterology   Ambulatory referral to Hematology / Oncology      I have reviewed all medical records from hospital follow up including radiology reports and consults from other physicians. Appropriate follow up diagnostics will be scheduled as needed. Patient/ Family understands the plan of treatment. Time spent 50  minutes.   Dr [name], MD Internal Medicine

## 2021-03-11 NOTE — Telephone Encounter (Signed)
Pt call back that she is tired hard to walk and sob advised her that we did stat referral for Hematology and GI and also she did labs as soon labs result and also take it  easy and walk and drink plenty of water we call her and also if her symptoms get worse call 911 or go to ED if she had ride

## 2021-03-12 ENCOUNTER — Telehealth: Payer: Self-pay

## 2021-03-12 LAB — CBC WITH DIFFERENTIAL/PLATELET
Basophils Absolute: 0 10*3/uL (ref 0.0–0.2)
Basos: 0 %
EOS (ABSOLUTE): 0.1 10*3/uL (ref 0.0–0.4)
Eos: 2 %
Hematocrit: 28.2 % — ABNORMAL LOW (ref 34.0–46.6)
Hemoglobin: 8.8 g/dL — ABNORMAL LOW (ref 11.1–15.9)
Immature Grans (Abs): 0 10*3/uL (ref 0.0–0.1)
Immature Granulocytes: 0 %
Lymphocytes Absolute: 0.9 10*3/uL (ref 0.7–3.1)
Lymphs: 13 %
MCH: 29.4 pg (ref 26.6–33.0)
MCHC: 31.2 g/dL — ABNORMAL LOW (ref 31.5–35.7)
MCV: 94 fL (ref 79–97)
Monocytes Absolute: 0.9 10*3/uL (ref 0.1–0.9)
Monocytes: 13 %
Neutrophils Absolute: 5.3 10*3/uL (ref 1.4–7.0)
Neutrophils: 72 %
Platelets: 312 10*3/uL (ref 150–450)
RBC: 2.99 x10E6/uL — ABNORMAL LOW (ref 3.77–5.28)
RDW: 16.6 % — ABNORMAL HIGH (ref 11.7–15.4)
WBC: 7.3 10*3/uL (ref 3.4–10.8)

## 2021-03-12 LAB — IRON,TIBC AND FERRITIN PANEL
Ferritin: 70 ng/mL (ref 15–150)
Iron Saturation: 39 % (ref 15–55)
Iron: 157 ug/dL — ABNORMAL HIGH (ref 27–139)
Total Iron Binding Capacity: 405 ug/dL (ref 250–450)
UIBC: 248 ug/dL (ref 118–369)

## 2021-03-12 LAB — BASIC METABOLIC PANEL
BUN/Creatinine Ratio: 14 (ref 12–28)
BUN: 13 mg/dL (ref 8–27)
CO2: 25 mmol/L (ref 20–29)
Calcium: 9 mg/dL (ref 8.7–10.3)
Chloride: 99 mmol/L (ref 96–106)
Creatinine, Ser: 0.9 mg/dL (ref 0.57–1.00)
Glucose: 93 mg/dL (ref 65–99)
Potassium: 4 mmol/L (ref 3.5–5.2)
Sodium: 138 mmol/L (ref 134–144)
eGFR: 68 mL/min/{1.73_m2} (ref >59)

## 2021-03-12 LAB — B12 AND FOLATE PANEL
Folate: >20 ng/mL (ref >3.0)
Vitamin B-12: 887 pg/mL (ref 232–1245)

## 2021-03-12 NOTE — Telephone Encounter (Signed)
Pt advised about her still hemoglobin is still low she need to see GI and hematology and we will call them today again for stat referral

## 2021-03-12 NOTE — Telephone Encounter (Signed)
[facility] in regards to STAT referral placed yesterday. Was not able to speak with a staff member so left a voice mail asking them to return my call. Loma Sousa

## 2021-03-12 NOTE — Telephone Encounter (Signed)
Lmom to call us back regarding her labs

## 2021-03-14 ENCOUNTER — Other Ambulatory Visit: Payer: Self-pay

## 2021-03-14 ENCOUNTER — Inpatient Hospital Stay: Payer: Medicare Other

## 2021-03-14 ENCOUNTER — Encounter: Payer: Self-pay | Admitting: Nurse Practitioner

## 2021-03-14 ENCOUNTER — Inpatient Hospital Stay: Payer: Medicare Other | Attending: Oncology | Admitting: Nurse Practitioner

## 2021-03-14 VITALS — BP 137/90 | HR 81 | Temp 98.0°F | Resp 18 | Wt 184.2 lb

## 2021-03-14 DIAGNOSIS — Z79899 Other long term (current) drug therapy: Secondary | ICD-10-CM

## 2021-03-14 DIAGNOSIS — Z7901 Long term (current) use of anticoagulants: Secondary | ICD-10-CM | POA: Insufficient documentation

## 2021-03-14 DIAGNOSIS — I509 Heart failure, unspecified: Secondary | ICD-10-CM | POA: Insufficient documentation

## 2021-03-14 DIAGNOSIS — Z87891 Personal history of nicotine dependence: Secondary | ICD-10-CM | POA: Diagnosis not present

## 2021-03-14 DIAGNOSIS — D649 Anemia, unspecified: Secondary | ICD-10-CM

## 2021-03-14 DIAGNOSIS — E039 Hypothyroidism, unspecified: Secondary | ICD-10-CM | POA: Diagnosis not present

## 2021-03-14 DIAGNOSIS — I480 Paroxysmal atrial fibrillation: Secondary | ICD-10-CM | POA: Insufficient documentation

## 2021-03-14 DIAGNOSIS — I110 Hypertensive heart disease with heart failure: Secondary | ICD-10-CM | POA: Diagnosis not present

## 2021-03-14 DIAGNOSIS — J449 Chronic obstructive pulmonary disease, unspecified: Secondary | ICD-10-CM

## 2021-03-14 DIAGNOSIS — G4733 Obstructive sleep apnea (adult) (pediatric): Secondary | ICD-10-CM | POA: Diagnosis not present

## 2021-03-14 DIAGNOSIS — Z9889 Other specified postprocedural states: Secondary | ICD-10-CM | POA: Insufficient documentation

## 2021-03-14 LAB — CBC WITH DIFFERENTIAL/PLATELET
Abs Immature Granulocytes: 0.03 10*3/uL (ref 0.00–0.07)
Basophils Absolute: 0 10*3/uL (ref 0.0–0.1)
Basophils Relative: 1 %
Eosinophils Absolute: 0.1 10*3/uL (ref 0.0–0.5)
Eosinophils Relative: 1 %
HCT: 28.7 % — ABNORMAL LOW (ref 36.0–46.0)
Hemoglobin: 8.4 g/dL — ABNORMAL LOW (ref 12.0–15.0)
Immature Granulocytes: 1 %
Lymphocytes Relative: 14 %
Lymphs Abs: 0.9 10*3/uL (ref 0.7–4.0)
MCH: 29.6 pg (ref 26.0–34.0)
MCHC: 29.3 g/dL — ABNORMAL LOW (ref 30.0–36.0)
MCV: 101.1 fL — ABNORMAL HIGH (ref 80.0–100.0)
Monocytes Absolute: 0.7 10*3/uL (ref 0.1–1.0)
Monocytes Relative: 11 %
Neutro Abs: 4.7 10*3/uL (ref 1.7–7.7)
Neutrophils Relative %: 72 %
Platelets: 292 10*3/uL (ref 150–400)
RBC: 2.84 MIL/uL — ABNORMAL LOW (ref 3.87–5.11)
RDW: 18.3 % — ABNORMAL HIGH (ref 11.5–15.5)
WBC: 6.5 10*3/uL (ref 4.0–10.5)
nRBC: 0 % (ref 0.0–0.2)

## 2021-03-14 LAB — SEDIMENTATION RATE: Sed Rate: 70 mm/hr — ABNORMAL HIGH (ref 0–30)

## 2021-03-14 LAB — COMPREHENSIVE METABOLIC PANEL
ALT: 13 U/L (ref 0–44)
AST: 17 U/L (ref 15–41)
Albumin: 3.7 g/dL (ref 3.5–5.0)
Alkaline Phosphatase: 66 U/L (ref 38–126)
Anion gap: 9 (ref 5–15)
BUN: 17 mg/dL (ref 8–23)
CO2: 28 mmol/L (ref 22–32)
Calcium: 9 mg/dL (ref 8.9–10.3)
Chloride: 96 mmol/L — ABNORMAL LOW (ref 98–111)
Creatinine, Ser: 0.81 mg/dL (ref 0.44–1.00)
GFR, Estimated: >60 mL/min (ref >60)
Glucose, Bld: 93 mg/dL (ref 70–99)
Potassium: 3.7 mmol/L (ref 3.5–5.1)
Sodium: 133 mmol/L — ABNORMAL LOW (ref 135–145)
Total Bilirubin: 0.5 mg/dL (ref 0.3–1.2)
Total Protein: 6.9 g/dL (ref 6.5–8.1)

## 2021-03-14 LAB — TECHNOLOGIST SMEAR REVIEW: Plt Morphology: NORMAL

## 2021-03-14 LAB — IRON AND TIBC
Iron: 156 ug/dL (ref 28–170)
Saturation Ratios: 34 % — ABNORMAL HIGH (ref 10.4–31.8)
TIBC: 463 ug/dL — ABNORMAL HIGH (ref 250–450)
UIBC: 307 ug/dL

## 2021-03-14 LAB — RETICULOCYTES
Immature Retic Fract: 35 % — ABNORMAL HIGH (ref 2.3–15.9)
RBC.: 2.81 MIL/uL — ABNORMAL LOW (ref 3.87–5.11)
Retic Count, Absolute: 206.5 10*3/uL — ABNORMAL HIGH (ref 19.0–186.0)
Retic Ct Pct: 7.4 % — ABNORMAL HIGH (ref 0.4–3.1)

## 2021-03-14 LAB — DAT, POLYSPECIFIC AHG (ARMC ONLY): Polyspecific AHG test: NEGATIVE

## 2021-03-14 LAB — TSH: TSH: 2.406 u[IU]/mL (ref 0.350–4.500)

## 2021-03-14 LAB — FERRITIN: Ferritin: 42 ng/mL (ref 11–307)

## 2021-03-14 LAB — VITAMIN B12: Vitamin B-12: 631 pg/mL (ref 180–914)

## 2021-03-14 LAB — FOLATE: Folate: 60 ng/mL (ref >5.9)

## 2021-03-14 NOTE — Progress Notes (Signed)
[place] at [facility] [address] [place], [place] [address] [phone] ([phone] (fax)  Clinic Day:  03/14/2021  Referring physician: [name], MD  CHIEF COMPLAINT:  CC: Anemia   HISTORY OF PRESENT ILLNESS:  [name] is a 75 y.o. female with a history of aortic stenosis bioprosthetic valve, a fib, on coumadin, chronic respiratory failure, heart failure, chronic oxygen use, morbid obesity, who is referred in consultation with Dr. Humphrey Rolls for assessment and management of anemia.  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   Reviewed labs that have been previously performed which showed anemia with hemoglobin of 8.7, previously baseline around 10. No aggravating or improving factors.  Associated signs and symptoms: Patient reports fatigue.  SOB with exertion.  Denies weight loss, easy bruising, hematochezia, hemoptysis, hematuria.  She denies recent chest pain on exertion, pre-syncopal episodes, or palpitations. She had not noticed any recent bleeding such as epistaxis, hematuria or hematochezia The patient denies over the counter NSAID ingestion. She takes eliquis for history of a fib.  History of GI Bleeding: denies History of CKD: denies History of autoimmune disease: denies History of hemolytic anemia: denies She had EGD/Colonoscopy about 2.5 years ago with overall normal EGD and colonoscopy with small polyps but fair prep but note mentioned no large lesions were seen and recommended 5 year f/u interval. She is followed by GI.  She is post menopausal.  She had no prior history or diagnosis of cancer. Her age appropriate screening programs are up-to-date. She denies any pica and eats a variety of diet. She never donated blood or received blood transfusion The patient was prescribed oral iron supplements and she takes 1 tablet twice a day.    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 s/p bioprosthetic valve replacement 2008 at [facility];  b. 01/2015 Echo: EF 60-65%, no rwma, Gr1 DD, mildly dil LA, nl RV fxn.   Atrial fibrillation with RVR ([place]) 01/11/2015   Atrial flutter ([place])    a. 08/2016 s/p DCCV.  Remains on flecainide 50 mg bid.   Basal skull fracture ([name]) 20 yrs ago   CHF (congestive heart failure) ([name])    Chronic respiratory failure ([name])    COPD (chronic obstructive pulmonary disease) ([place])    a. 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 2008 prior to Aortic aneurysm repair-->nl cors.   History of stress test    a. 10/2015 MV: no ischemia/infarct.   HLD (hyperlipidemia)    HTN (hypertension)    Hypothyroidism    Obesity    PAF (paroxysmal atrial fibrillation) ([name])    a. on Eliquis; b. CHADS2VASc = 3 (HTN, age x 1, female).   Paroxysmal atrial fibrillation ([place]) 01/24/2015   Right upper quadrant abdominal tenderness without rebound tenderness 03/02/2018   S/P ascending aortic aneurysm repair 2008    Past Surgical History:  Procedure Laterality Date   ABDOMINAL AORTIC ANEURYSM REPAIR  2008   ABDOMINAL HYSTERECTOMY     AORTIC VALVE REPLACEMENT  2008   CARDIAC CATHETERIZATION     [facility]   CARDIOVERSION N/A 08/15/2018   Procedure: CARDIOVERSION;  Surgeon: [name], MD;  Location: [facility] ORS;  Service: Cardiovascular;  Laterality: N/A;  CARDIOVERSION N/A 11/14/2018   Procedure: CARDIOVERSION (CATH LAB);  Surgeon: [name], MD;  Location: [facility] ORS;  Service: Cardiovascular;  Laterality: N/A;   CARDIOVERSION N/A 06/05/2019   Procedure: CARDIOVERSION;  Surgeon: [name], MD;  Location: [facility] ORS;  Service: Cardiovascular;  Laterality: N/A;   CARDIOVERSION N/A 08/14/2019   Procedure: CARDIOVERSION;  Surgeon: [name], MD;  Location: [facility];  Service: Cardiovascular;  Laterality: N/A;    CARDIOVERSION N/A 11/09/2019   Procedure: CARDIOVERSION;  Surgeon: Isaias Cowman, MD;  Location: [facility];  Service: Cardiovascular;  Laterality: N/A;   CARDIOVERSION N/A 01/17/2020   Procedure: CARDIOVERSION;  Surgeon: Isaias Cowman, MD;  Location: [facility];  Service: Cardiovascular;  Laterality: N/A;   CARPAL TUNNEL RELEASE     ELECTROPHYSIOLOGIC STUDY N/A 08/17/2016   Procedure: Cardioversion;  Surgeon: [name], MD;  Location: [facility] OR[facility];  Service: Cardiovascular;  Laterality: N/A;   TUMOR EXCISION Left    x3 (arm)    Family History  Problem Relation Age of Onset   Stroke Father    Stroke Paternal Grandmother     Social History:  reports that she has quit smoking. Her smoking use included cigarettes. She has never used smokeless tobacco. She reports that she does not drink alcohol and does not use drugs.  Allergies:  Allergies  Allergen Reactions   Benadryl [Diphenhydramine Hcl (Sleep)] Palpitations   Cetirizine Palpitations   Lasix [Furosemide] Rash   Levaquin [Levofloxacin In D5w] Other (See Comments)    Reaction:  Fatigue and muscle soreness   Meloxicam Rash   Soy Allergy Hives and Nausea And Vomiting   Sulfa Antibiotics Rash    Current Medications: Current Outpatient Medications  Medication Sig Dispense Refill   acetaminophen (TYLENOL) 500 MG tablet Take 500 mg by mouth every 4 (four) hours as needed for moderate pain or headache.      ALPRAZolam (XANAX) 0.25 MG tablet Take 1 tablet (0.25 mg total) by mouth 2 (two) times daily as needed for anxiety. 60 tablet 1   atorvastatin (LIPITOR) 40 MG tablet 1 tab po qhs 90 tablet 1   bumetanide (BUMEX) 1 MG tablet Take 2 tablets (2 mg total) by mouth 2 (two) times daily. 360 tablet 3   Calcium Carbonate-Vitamin D (CALCIUM 600+D PO) Take 1 tablet by mouth daily.     Coenzyme Q10 (COQ10) 200 MG CAPS Take 200 mg by mouth daily.     diclofenac Sodium (VOLTAREN) 1 % GEL SMARTSIG:Gram(s) Topical Twice Daily      ELIQUIS 5 MG TABS tablet Take 1 tablet by mouth twice daily. (Patient taking differently: Take 5 mg by mouth 2 (two) times daily.) 180 tablet 2   ferrous sulfate 325 (65 FE) MG tablet Take 2 tablets (650 mg total) by mouth 2 (two) times daily with a meal.  3   flecainide (TAMBOCOR) 50 MG tablet Take 1 tablet (50 mg total) by mouth 2 (two) times daily. PLEASE CONTACT OFFICE [phone] TO SCHEDULE APPOINTMENT FOR FUTURE REFILLS 30 tablet 0   levothyroxine (SYNTHROID) 25 MCG tablet Take 1 tablet (25 mcg total) by mouth daily before breakfast. 90 tablet 1   lisinopril (ZESTRIL) 40 MG tablet Take 1 tablet (40 mg total) by mouth daily. 90 tablet 3   loratadine (CLARITIN) 10 MG tablet Take 10 mg by mouth daily.     oxyCODONE-acetaminophen (PERCOCET) 7.5-325 MG tablet Take 0.5-1 tablets by mouth daily as needed for severe pain. Take half to one tab po qd prn  for pain 30 tablet 0   pantoprazole (PROTONIX) 40 MG tablet Take 1 tablet (40 mg total) by mouth daily. 90 tablet 1   potassium chloride (KLOR-CON) 10 MEQ tablet Take 4 tablets (40 mEq total) by mouth 2 (two) times daily. 720 tablet 3   sertraline (ZOLOFT) 50 MG tablet Take 1 tablet (50 mg total) by mouth daily. 90 tablet 1   traZODone (DESYREL) 50 MG tablet Take 1 tablet (50 mg total) by mouth at bedtime. 90 tablet 0   TRELEGY ELLIPTA 100-62.5-25 MCG/INH AEPB Inhale 1 puff by mouth into the lungs daily. 60 each 2   VENTOLIN HFA 108 (90 Base) MCG/ACT inhaler INHALE 2 PUFFS INTO THE LUNGS EVERY 6 HOURS AS NEEDED FOR WHEEZING OR SHORTNESS OF BREATH 18 g 3   No current facility-administered medications for this visit.    REVIEW OF SYSTEMS:  Review of Systems  Constitutional:  Positive for fatigue. Negative for appetite change and unexpected weight change.  HENT:   Negative for mouth sores, sore throat and trouble swallowing.   Respiratory:  Positive for shortness of breath. Negative for chest tightness, cough and wheezing.   Cardiovascular:   Negative for chest pain and leg swelling.  Gastrointestinal:  Negative for abdominal pain, constipation, diarrhea, nausea and vomiting.  Genitourinary:  Negative for bladder incontinence and dysuria.   Musculoskeletal:  Negative for flank pain and neck stiffness.  Skin:  Negative for itching, rash and wound.  Neurological:  Negative for dizziness, headaches, light-headedness and numbness.  Psychiatric/Behavioral:  Negative for confusion, depression and sleep disturbance. The patient is not nervous/anxious.     VITALS:  Blood pressure 137/90, pulse 81, temperature 98 F (36.7 C), resp. rate 18, weight 184 lb 4 oz (83.6 kg).  Wt Readings from Last 3 Encounters:  03/14/21 184 lb 4 oz (83.6 kg)  03/11/21 183 lb 12.8 oz (83.4 kg)  01/29/21 178 lb (80.7 kg)    Body mass index is 38.51 kg/m.  Performance status (ECOG): 2 - Symptomatic, <50% confined to bed  PHYSICAL EXAM:  Physical Exam Nursing note reviewed.  Constitutional:      Appearance: She is obese. She is not ill-appearing.     Interventions: Nasal cannula in place.  HENT:     Head: Normocephalic.  Eyes:     General: No scleral icterus.    Conjunctiva/sclera: Conjunctivae normal.  Cardiovascular:     Rate and Rhythm: Normal rate and regular rhythm.     Pulses: Normal pulses.  Pulmonary:     Effort: Pulmonary effort is normal. No respiratory distress.     Breath sounds: Decreased air movement present.  Abdominal:     General: There is no distension.     Tenderness: There is no abdominal tenderness. There is no guarding.  Musculoskeletal:        General: No swelling or deformity.     Cervical back: Neck supple.  Lymphadenopathy:     Cervical: No cervical adenopathy.  Skin:    General: Skin is warm and dry.     Coloration: Skin is not pale.     Findings: No rash.  Neurological:     Mental Status: She is alert and oriented to person, place, and time.  Psychiatric:        Behavior: Behavior normal.        Thought  Content: Thought content normal.    LABS:   CBC Latest Ref Rng & Units 03/11/2021 01/30/2021 01/29/2021  WBC 3.4 - 10.8 x10E3/uL 7.3  7.0 7.8  Hemoglobin 11.1 - 15.9 g/dL 8.8(L) 8.6(L) 8.7(L)  Hematocrit 34.0 - 46.6 % 28.2(L) 26.9(L) 27.3(L)  Platelets 150 - 450 x10E3/uL 312 243 269   CMP Latest Ref Rng & Units 03/11/2021 01/30/2021 01/29/2021  Glucose 65 - 99 mg/dL 93 134(H) 110(H)  BUN 8 - 27 mg/dL 13 24(H) 23  Creatinine 0.57 - 1.00 mg/dL 0.90 0.97 0.88  Sodium 134 - 144 mmol/L 138 134(L) 136  Potassium 3.5 - 5.2 mmol/L 4.0 3.3(L) 4.2  Chloride 96 - 106 mmol/L 99 101 100  CO2 20 - 29 mmol/L 25 25 26   Calcium 8.7 - 10.3 mg/dL 9.0 8.6(L) 8.5(L)  Total Protein 6.5 - 8.1 g/dL - - 6.0(L)  Total Bilirubin 0.3 - 1.2 mg/dL - - 0.6  Alkaline Phos 38 - 126 U/L - - 55  AST 15 - 41 U/L - - 22  ALT 0 - 44 U/L - - 16   No results found for: CEA1 / No results found for: CEA1 No results found for: PSA1 No results found for: JOA416 No results found for: CAN125  No results found for: Ronnald Ramp, A1GS, A2GS, Arnaldo Natal, GAMS, MSPIKE, SPEI Lab Results  Component Value Date   TIBC 405 03/11/2021   TIBC 409 01/29/2021   TIBC 357 07/17/2019   FERRITIN 70 03/11/2021   FERRITIN 21 01/30/2021   FERRITIN 73 07/17/2019   IRONPCTSAT 39 03/11/2021   IRONPCTSAT 9 (L) 01/29/2021   IRONPCTSAT 13 (L) 07/17/2019   No results found for: LDH  STUDIES:  No results found.    ASSESSMENT & PLAN:   Assessment:  [name] is a 75 y.o. female with multiple medical comorbidities who presents as new patient for anemia.   Anemia: today we reviewed possible etiologies of anemia including possibly multifactorial with possible causes including chronic blood loss, hyper/hypothyroidism, nutritional deficiency, infection/chronic inflammation, hemolysis, underlying bone marrow disorders. Will plan for labs today:  CBC w differential, CMP, vitamin B12, Folate, iron/TIBC, ferritin, reticulocytes, blood  smear, TSH,  LDH, haptoglobin, monoclonal gammopathy evaluation.     Plan: 1.  Labs today RTC in 3 weeks to establish care with MD and discuss results   I discussed the assessment and treatment plan with the patient.  The patient was provided an opportunity to ask questions and all were answered.  The patient agreed with the plan and demonstrated an understanding of the instructions.  The patient was advised to call back if the symptoms worsen or if the condition fails to improve as anticipated.  Thank you for the opportunity to participate in the care of this very pleasant patient  Beckey Rutter, [place], AGNP-C [place] at [facility] [phone] (clinic)

## 2021-03-15 LAB — HAPTOGLOBIN: Haptoglobin: 117 mg/dL (ref 42–346)

## 2021-03-15 LAB — ERYTHROPOIETIN: Erythropoietin: 128.7 m[IU]/mL — ABNORMAL HIGH (ref 2.6–18.5)

## 2021-03-17 ENCOUNTER — Telehealth: Payer: Self-pay

## 2021-03-17 ENCOUNTER — Emergency Department: Payer: Medicare Other

## 2021-03-17 ENCOUNTER — Emergency Department
Admission: EM | Admit: 2021-03-17 | Discharge: 2021-03-17 | Disposition: A | Discharge status: Home / Self Care | Payer: Medicare Other | Attending: Emergency Medicine | Admitting: Emergency Medicine

## 2021-03-17 ENCOUNTER — Other Ambulatory Visit: Payer: Self-pay

## 2021-03-17 ENCOUNTER — Ambulatory Visit: Payer: Medicare Other | Admitting: Pain Medicine

## 2021-03-17 ENCOUNTER — Encounter: Payer: Self-pay | Admitting: Emergency Medicine

## 2021-03-17 DIAGNOSIS — R0602 Shortness of breath: Secondary | ICD-10-CM | POA: Insufficient documentation

## 2021-03-17 DIAGNOSIS — Z7901 Long term (current) use of anticoagulants: Secondary | ICD-10-CM | POA: Insufficient documentation

## 2021-03-17 DIAGNOSIS — Z79899 Other long term (current) drug therapy: Secondary | ICD-10-CM | POA: Insufficient documentation

## 2021-03-17 DIAGNOSIS — R0600 Dyspnea, unspecified: Secondary | ICD-10-CM

## 2021-03-17 DIAGNOSIS — J449 Chronic obstructive pulmonary disease, unspecified: Secondary | ICD-10-CM | POA: Insufficient documentation

## 2021-03-17 DIAGNOSIS — Z87891 Personal history of nicotine dependence: Secondary | ICD-10-CM | POA: Insufficient documentation

## 2021-03-17 DIAGNOSIS — I110 Hypertensive heart disease with heart failure: Secondary | ICD-10-CM | POA: Diagnosis not present

## 2021-03-17 DIAGNOSIS — Z950 Presence of cardiac pacemaker: Secondary | ICD-10-CM | POA: Diagnosis not present

## 2021-03-17 DIAGNOSIS — E039 Hypothyroidism, unspecified: Secondary | ICD-10-CM | POA: Diagnosis not present

## 2021-03-17 DIAGNOSIS — R0609 Other forms of dyspnea: Secondary | ICD-10-CM

## 2021-03-17 DIAGNOSIS — Z951 Presence of aortocoronary bypass graft: Secondary | ICD-10-CM | POA: Diagnosis not present

## 2021-03-17 DIAGNOSIS — I480 Paroxysmal atrial fibrillation: Secondary | ICD-10-CM | POA: Diagnosis not present

## 2021-03-17 DIAGNOSIS — I5033 Acute on chronic diastolic (congestive) heart failure: Secondary | ICD-10-CM | POA: Insufficient documentation

## 2021-03-17 LAB — PROTEIN ELECTROPHORESIS, SERUM
A/G Ratio: 1.4 (ref 0.7–1.7)
Albumin ELP: 3.6 g/dL (ref 2.9–4.4)
Alpha-1-Globulin: 0.3 g/dL (ref 0.0–0.4)
Alpha-2-Globulin: 0.6 g/dL (ref 0.4–1.0)
Beta Globulin: 1 g/dL (ref 0.7–1.3)
Gamma Globulin: 0.6 g/dL (ref 0.4–1.8)
Globulin, Total: 2.5 g/dL (ref 2.2–3.9)
Total Protein ELP: 6.1 g/dL (ref 6.0–8.5)

## 2021-03-17 LAB — BASIC METABOLIC PANEL
Anion gap: 8 (ref 5–15)
BUN: 18 mg/dL (ref 8–23)
CO2: 29 mmol/L (ref 22–32)
Calcium: 9.3 mg/dL (ref 8.9–10.3)
Chloride: 99 mmol/L (ref 98–111)
Creatinine, Ser: 0.93 mg/dL (ref 0.44–1.00)
GFR, Estimated: >60 mL/min (ref >60)
Glucose, Bld: 101 mg/dL — ABNORMAL HIGH (ref 70–99)
Potassium: 4.6 mmol/L (ref 3.5–5.1)
Sodium: 136 mmol/L (ref 135–145)

## 2021-03-17 LAB — CBC
HCT: 32.5 % — ABNORMAL LOW (ref 36.0–46.0)
Hemoglobin: 9.9 g/dL — ABNORMAL LOW (ref 12.0–15.0)
MCH: 30.6 pg (ref 26.0–34.0)
MCHC: 30.5 g/dL (ref 30.0–36.0)
MCV: 100.3 fL — ABNORMAL HIGH (ref 80.0–100.0)
Platelets: 350 10*3/uL (ref 150–400)
RBC: 3.24 MIL/uL — ABNORMAL LOW (ref 3.87–5.11)
RDW: 17.8 % — ABNORMAL HIGH (ref 11.5–15.5)
WBC: 7.4 10*3/uL (ref 4.0–10.5)
nRBC: 0 % (ref 0.0–0.2)

## 2021-03-17 LAB — HGB FRACTIONATION CASCADE
Hgb A2: 2 % (ref 1.8–3.2)
Hgb A: 98 % (ref 96.4–98.8)
Hgb F: 0 % (ref 0.0–2.0)
Hgb S: 0 %

## 2021-03-17 LAB — BRAIN NATRIURETIC PEPTIDE: B Natriuretic Peptide: 267.5 pg/mL — ABNORMAL HIGH (ref 0.0–100.0)

## 2021-03-17 LAB — TROPONIN I (HIGH SENSITIVITY): Troponin I (High Sensitivity): 13 ng/L (ref <18)

## 2021-03-17 NOTE — Telephone Encounter (Signed)
Pt unable to walk 10 feet she said she having Shortness of breath with 3.5 litre of oxygen and coughing and tightness in chest as per dfk need to go ED and called her cardiology and she said she saw hematology on Friday

## 2021-03-17 NOTE — ED Notes (Signed)
Purple and green top tubes sent to lab as per orders

## 2021-03-17 NOTE — Telephone Encounter (Signed)
Gets short of breath, has COPD, pt can barely walk 10 feet, she will be huffing and puffing and her chest hurts, oxygen lever will drop to 88% or 89% when moving around

## 2021-03-17 NOTE — ED Notes (Signed)
Clicked off labs in error. Pt refused blood draws earlier.

## 2021-03-17 NOTE — ED Provider Notes (Signed)
[facility] Emergency Department Provider Note   ____________________________________________   Event Date/Time   First MD Initiated Contact with Patient 03/17/21 1900     (approximate)  I have reviewed the triage vital signs and the nursing notes.   HISTORY  Chief Complaint Shortness of Breath    HPI [name] is a 75 y.o. female with below stated past medical history presents for shortness of breath  LOCATION: Chest DURATION: 2 weeks TIMING: Stable since onset SEVERITY: Moderate QUALITY: Shortness of breath CONTEXT: Describes worsening dyspnea on exertion that began 2 weeks prior to arrival and has been stable since onset with saturations in the 80s per patient's home O2 monitor.  Patient on 3 L nasal cannula chronically MODIFYING FACTORS: Shortness of breath worsened with exertion and partially relieved at rest and with supplemental O2 ASSOCIATED SYMPTOMS: Chest tightness   Per medical record review patient has significant history of COPD, CHF, atrial fibrillation/flutter and previous CABG          Past Medical History:  Diagnosis Date   Aortic 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 s/p bioprosthetic valve replacement 2008 at [facility];  b. 01/2015 Echo: EF 60-65%, no rwma, Gr1 DD, mildly dil LA, nl RV fxn.   Atrial fibrillation with RVR ([place]) 01/11/2015   Atrial flutter ([place])    a. 08/2016 s/p DCCV.  Remains on flecainide 50 mg bid.   Basal skull fracture ([name]) 20 yrs ago   CHF (congestive heart failure) ([name])    Chronic respiratory failure ([name])    COPD (chronic obstructive pulmonary disease) ([place])    a. 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 2008 prior to Aortic aneurysm repair-->nl cors.   History of stress test    a. 10/2015 MV: no ischemia/infarct.   HLD (hyperlipidemia)    HTN (hypertension)    Hypothyroidism     Obesity    PAF (paroxysmal atrial fibrillation) ([name])    a. on Eliquis; b. CHADS2VASc = 3 (HTN, age x 30, female).   Paroxysmal atrial fibrillation ([name]) 01/24/2015   Right upper quadrant abdominal tenderness without rebound tenderness 03/02/2018   S/P ascending aortic aneurysm repair 2008    Patient Active Problem List   Diagnosis Date Noted   Chronic anticoagulation 03/14/2021   Hx of atrioventricular node ablation 03/14/2021   Symptomatic anemia 01/29/2021   Acute on chronic diastolic CHF (congestive heart failure) ([place]) 05/11/2020   COPD (chronic obstructive pulmonary disease) ([name])    Hypothyroidism    Cardiac pacemaker in situ 05/10/2020   Acute non-recurrent frontal sinusitis 04/22/2020   Vertigo 04/22/2020   S/P placement of cardiac pacemaker 02/21/2020   Atrial fibrillation status post cardioversion ([place]) 11/16/2019   Episode of moderate major depression ([place]) 10/10/2019   Persistent atrial fibrillation ([place])    Encounter for general adult medical examination with abnormal findings 07/10/2019   Encounter for screening mammogram for malignant neoplasm of breast 07/10/2019   Atopic dermatitis 05/02/2019   Paroxysmal atrial flutter ([place]) 08/11/2018   Encounter for long-term (current) use of medications 07/09/2018   Chronic left shoulder pain 07/09/2018   Conjunctivitis 07/09/2018   Oxygen dependent 07/09/2018   GAD (generalized anxiety disorder) 07/09/2018   Ovarian failure 07/09/2018   Positive colorectal cancer screening using Cologuard test 04/24/2018   Calculus  of gallbladder with acute on chronic cholecystitis 04/08/2018   H/O: CVA (cerebrovascular accident) 04/08/2018   Dysuria 03/02/2018   Urinary tract infection without hematuria 03/02/2018   Right upper quadrant abdominal tenderness without rebound tenderness 03/02/2018   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 exacerbation ([place]) 03/02/2018   Chronic obstructive pulmonary disease ([place]) 09/19/2017   Typical  atrial flutter ([name])    Irritable bowel syndrome without diarrhea 01/29/2015   Essential hypertension 01/24/2015   Paroxysmal atrial fibrillation ([place]) 01/24/2015   History of aortic valve replacement with bioprosthetic valve 01/24/2015   Atrial fibrillation with RVR ([place]) 01/11/2015   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 05/18/2014   Impingement syndrome of shoulder 05/18/2014   Cellulitis and abscess 02/13/2014   MRSA (methicillin resistant Staphylococcus aureus) 02/13/2014   Aneurysm, ascending aorta ([place]) 06/23/2013   Bicuspid aortic valve 06/23/2013    Past Surgical History:  Procedure Laterality Date   ABDOMINAL AORTIC ANEURYSM REPAIR  2008   ABDOMINAL HYSTERECTOMY     AORTIC VALVE REPLACEMENT  2008   CARDIAC CATHETERIZATION     [place]   CARDIOVERSION N/A 08/15/2018   Procedure: CARDIOVERSION;  Surgeon: [name], MD;  Location: [facility] ORS;  Service: Cardiovascular;  Laterality: N/A;   CARDIOVERSION N/A 11/14/2018   Procedure: CARDIOVERSION (CATH LAB);  Surgeon: [name], MD;  Location: [facility] ORS;  Service: Cardiovascular;  Laterality: N/A;   CARDIOVERSION N/A 06/05/2019   Procedure: CARDIOVERSION;  Surgeon: [name], MD;  Location: [facility] ORS;  Service: Cardiovascular;  Laterality: N/A;   CARDIOVERSION N/A 08/14/2019   Procedure: CARDIOVERSION;  Surgeon: [name], MD;  Location: [place] [facility];  Service: Cardiovascular;  Laterality: N/A;   CARDIOVERSION N/A 11/09/2019   Procedure: CARDIOVERSION;  Surgeon: Isaias Cowman, MD;  Location: [place] [facility];  Service: Cardiovascular;  Laterality: N/A;   CARDIOVERSION N/A 01/17/2020   Procedure: CARDIOVERSION;  Surgeon: Isaias Cowman, MD;  Location: [facility] ORS;  Service: Cardiovascular;  Laterality: N/A;   CARPAL TUNNEL RELEASE     ELECTROPHYSIOLOGIC STUDY N/A 08/17/2016   Procedure: Cardioversion;  Surgeon: [name], MD;  Location: [facility] ORS;  Service: Cardiovascular;  Laterality:  N/A;   TUMOR EXCISION Left    x3 (arm)    Prior to Admission medications   Medication Sig Start Date End Date Taking? Authorizing Provider  acetaminophen (TYLENOL) 500 MG tablet Take 500 mg by mouth every 4 (four) hours as needed for moderate pain or headache.     [provider]  ALPRAZolam Duanne Moron) 0.25 MG tablet Take 1 tablet (0.25 mg total) by mouth 2 (two) times daily as needed for anxiety. 01/20/21   Jonetta Osgood, NP  atorvastatin (LIPITOR) 40 MG tablet 1 tab po qhs 03/06/21   Abernathy, Yetta Flock, NP  bumetanide (BUMEX) 1 MG tablet Take 2 tablets (2 mg total) by mouth 2 (two) times daily. 08/20/20 03/14/22  Alisa Graff, FNP  Calcium Carbonate-Vitamin D (CALCIUM 600+D PO) Take 1 tablet by mouth daily.    [provider]  Coenzyme Q10 (COQ10) 200 MG CAPS Take 200 mg by mouth daily.    [provider]  diclofenac Sodium (VOLTAREN) 1 % GEL SMARTSIG:Gram(s) Topical Twice Daily 09/09/20   [provider]  ELIQUIS 5 MG TABS tablet Take 1 tablet by mouth twice daily. Patient taking differently: Take 5 mg by mouth 2 (two) times daily. 10/26/19   [name], MD  ferrous sulfate 325 (65 FE) MG tablet Take 2 tablets (650 mg total) by mouth 2 (  two) times daily with a meal. 01/30/21   Jennye Boroughs, MD  flecainide (TAMBOCOR) 50 MG tablet Take 1 tablet (50 mg total) by mouth 2 (two) times daily. PLEASE CONTACT OFFICE [phone] TO SCHEDULE APPOINTMENT FOR FUTURE REFILLS 02/23/20   Deboraha Sprang, MD  levothyroxine (SYNTHROID) 25 MCG tablet Take 1 tablet (25 mcg total) by mouth daily before breakfast. 12/24/20   [name], MD  lisinopril (ZESTRIL) 40 MG tablet Take 1 tablet (40 mg total) by mouth daily. 10/07/20 03/14/22  Alisa Graff, FNP  loratadine (CLARITIN) 10 MG tablet Take 10 mg by mouth daily.    [provider]  oxyCODONE-acetaminophen (PERCOCET) 7.5-325 MG tablet Take 0.5-1 tablets by mouth daily as needed for severe pain. Take half to one  tab po qd prn for pain 03/07/21   Jonetta Osgood, NP  pantoprazole (PROTONIX) 40 MG tablet Take 1 tablet (40 mg total) by mouth daily. 12/24/20   [name], MD  potassium chloride (KLOR-CON) 10 MEQ tablet Take 4 tablets (40 mEq total) by mouth 2 (two) times daily. 11/28/20   [name], MD  sertraline (ZOLOFT) 50 MG tablet Take 1 tablet (50 mg total) by mouth daily. 02/10/21   [name], MD  traZODone (DESYREL) 50 MG tablet Take 1 tablet (50 mg total) by mouth at bedtime. 02/20/21 05/21/21  [name], MD  TRELEGY ELLIPTA 100-62.5-25 MCG/INH AEPB Inhale 1 puff by mouth into the lungs daily. 12/24/20   [name], MD  VENTOLIN HFA 108 [phone] Base) MCG/ACT inhaler INHALE 2 PUFFS INTO THE LUNGS EVERY 6 HOURS AS NEEDED FOR WHEEZING OR SHORTNESS OF BREATH 02/17/21   Jonetta Osgood, NP    Allergies Benadryl [diphenhydramine hcl (sleep)], Cetirizine, Lasix [furosemide], Levaquin [levofloxacin in d5w], Meloxicam, Soy allergy, and Sulfa antibiotics  Family History  Problem Relation Age of Onset   Stroke Father    Stroke Paternal Grandmother     Social History Social History   Tobacco Use   Smoking status: Former    Pack years: 0.00    Types: Cigarettes   Smokeless tobacco: Never  Vaping Use   Vaping Use: Never used  Substance Use Topics   Alcohol use: No   Drug use: No    Review of Systems Constitutional: No fever/chills Eyes: No visual changes. ENT: No sore throat. Cardiovascular: Endorses chest pain. Respiratory: Endorses shortness of breath. Gastrointestinal: No abdominal pain.  No nausea, no vomiting.  No diarrhea. Genitourinary: Negative for dysuria. Musculoskeletal: Negative for acute arthralgias Skin: Negative for rash. Neurological: Negative for headaches, weakness/numbness/paresthesias in any extremity Psychiatric: Negative for suicidal ideation/homicidal ideation   ____________________________________________   PHYSICAL EXAM:  VITAL SIGNS: ED Triage  Vitals  Enc Vitals Group     BP 03/17/21 1603 (!) 116/94     Pulse Rate 03/17/21 1603 75     Resp 03/17/21 1603 18     Temp 03/17/21 1603 98.7 F (37.1 C)     Temp Source 03/17/21 1603 Oral     SpO2 03/17/21 1603 97 %     Weight 03/17/21 1559 184 lb (83.5 kg)     Height 03/17/21 1559 4\' 10"  (1.473 m)     Head Circumference --      Peak Flow --      Pain Score 03/17/21 1558 3     Pain Loc --      Pain Edu? --      Excl. in [place]? --    Constitutional:  Alert and oriented. Well appearing obese elderly Caucasian female in no acute distress. Eyes: Conjunctivae are normal. PERRL. Head: Atraumatic. Nose: No congestion/rhinnorhea. Mouth/Throat: Mucous membranes are moist. Neck: No stridor Cardiovascular: Grossly normal heart sounds.  Good peripheral circulation. Respiratory: 3 L nasal cannula in place.  Normal respiratory effort.  No retractions. Gastrointestinal: Soft and nontender. No distention. Musculoskeletal: No obvious deformities Neurologic:  Normal speech and language. No gross focal neurologic deficits are appreciated. Skin:  Skin is warm and dry. No rash noted. Psychiatric: Mood and affect are normal. Speech and behavior are normal.  ____________________________________________   LABS (all labs ordered are listed, but only abnormal results are displayed)  Labs Reviewed  BASIC METABOLIC PANEL - Abnormal; Notable for the following components:      Result Value   Glucose, Bld 101 (*)    All other components within normal limits  CBC - Abnormal; Notable for the following components:   RBC 3.24 (*)    Hemoglobin 9.9 (*)    HCT 32.5 (*)    MCV 100.3 (*)    RDW 17.8 (*)    All other components within normal limits  BRAIN NATRIURETIC PEPTIDE - Abnormal; Notable for the following components:   B Natriuretic Peptide 267.5 (*)    All other components within normal limits  TROPONIN I (HIGH SENSITIVITY)  TROPONIN I (HIGH SENSITIVITY)    ____________________________________________  EKG  ED ECG REPORT I, Naaman Plummer, the attending physician, personally viewed and interpreted this ECG.  Date: 03/17/2021 EKG Time: 1606 Rate: 77 Rhythm: Atrially sensed ventricular paced rhythm QRS Axis: normal Intervals: normal ST/T Wave abnormalities: normal Narrative Interpretation: no evidence of acute ischemia  ____________________________________________  RADIOLOGY  ED MD interpretation: 2 view chest x-ray shows prominent bibasilar interstitial markings  Official radiology report(s): DG Chest 2 View  Result Date: 03/17/2021 CLINICAL DATA:  Shortness of breath EXAM: CHEST - 2 VIEW COMPARISON:  05/11/2020 FINDINGS: Left-sided implanted cardiac device in stable positioning. Prior median sternotomy and prosthetic heart valve. Stable cardiomediastinal contours. Atherosclerotic calcification of the aortic knob. Mildly prominent bibasilar interstitial markings. No pleural effusion or pneumothorax. IMPRESSION: Mildly prominent bibasilar interstitial markings, which may reflect atelectasis or mild edema. Electronically Signed   By: Davina Poke D.O.   On: 03/17/2021 16:52    ____________________________________________   PROCEDURES  Procedure(s) performed (including Critical Care):  .1-3 Lead EKG Interpretation  Date/Time: 03/17/2021 10:24 PM Performed by: Naaman Plummer, MD Authorized by: Naaman Plummer, MD     Interpretation: normal     ECG rate:  71   ECG rate assessment: normal     Rhythm: sinus rhythm     Ectopy: none     Conduction: normal     ____________________________________________   INITIAL IMPRESSION / ASSESSMENT AND PLAN / ED COURSE  As part of my medical decision making, I reviewed the following data within the electronic medical record, if available:  Nursing notes reviewed and incorporated, Labs reviewed, EKG interpreted, Old chart reviewed, Radiograph reviewed and Notes from prior ED visits  reviewed and incorporated        The patient is suffering from shortness of breath, but the immediate cause is not apparent.  Potential causes considered include, but are not limited to, asthma or COPD, congestive heart failure, pulmonary embolism, pneumothorax, coronary syndrome, pneumonia, and pleural effusion.  Despite the evaluation including history, exam, and testing, the cause of the shortness of breath remains unclear. However, during the ED stay, patients condition improved, and  at the time of discharge the shortness of breath is resolved, they are feeling well, and want to go home.  Patient will be discharged with strict return precautions and advice to follow up with primary MD within 24 hours for further evaluation.      ____________________________________________   FINAL CLINICAL IMPRESSION(S) / ED DIAGNOSES  Final diagnoses:  DOE (dyspnea on exertion)     ED Discharge Orders     None        Note:  This document was prepared using Dragon voice recognition software and may include unintentional dictation errors.    Naaman Plummer, MD 03/17/21 2224

## 2021-03-17 NOTE — ED Triage Notes (Signed)
States I am anemic, I have COPD and heart troubles.  I am here today because my oxygen level drops to 84% when I stand.  Also c/o DOE, chest pain for two weeks.  Wears 3L/ [place] at home.  Replaced oxygen with hospital tank.  AAOx3.  Skin warm and dry. NAD

## 2021-03-17 NOTE — ED Notes (Signed)
Patient refusing blood draw in triage.  States she has recently had multiple blood draws from PCP and cancer center.  States she doesn't want blood drawn "unless I have to'.

## 2021-03-18 ENCOUNTER — Telehealth: Payer: Self-pay | Admitting: Internal Medicine

## 2021-03-18 LAB — ANA W/REFLEX: Anti Nuclear Antibody (ANA): NEGATIVE

## 2021-03-18 NOTE — Chronic Care Management (AMB) (Signed)
  Chronic Care Management   Outreach Note  03/18/2021 Name: BINTA STATZER MRN: [id number] DOB: 1946/03/28  Referred by: [name], MD Reason for referral : No chief complaint on file.   A second unsuccessful telephone outreach was attempted today. The patient was referred to pharmacist for assistance with care management and care coordination.  Follow Up Plan:   Tatjana Dellinger Upstream Scheduler

## 2021-03-26 DIAGNOSIS — G4733 Obstructive sleep apnea (adult) (pediatric): Secondary | ICD-10-CM | POA: Diagnosis not present

## 2021-03-27 ENCOUNTER — Ambulatory Visit: Payer: Medicare Other | Admitting: Internal Medicine

## 2021-04-01 ENCOUNTER — Ambulatory Visit: Payer: Medicare Other | Admitting: Internal Medicine

## 2021-04-01 ENCOUNTER — Other Ambulatory Visit: Payer: Self-pay

## 2021-04-01 ENCOUNTER — Encounter: Payer: Self-pay | Admitting: Internal Medicine

## 2021-04-01 VITALS — BP 118/60 | HR 82 | Temp 98.7°F | Resp 16 | Ht <= 58 in | Wt 184.8 lb

## 2021-04-01 DIAGNOSIS — D638 Anemia in other chronic diseases classified elsewhere: Secondary | ICD-10-CM | POA: Diagnosis not present

## 2021-04-01 DIAGNOSIS — I480 Paroxysmal atrial fibrillation: Secondary | ICD-10-CM

## 2021-04-01 DIAGNOSIS — R0602 Shortness of breath: Secondary | ICD-10-CM

## 2021-04-01 DIAGNOSIS — J449 Chronic obstructive pulmonary disease, unspecified: Secondary | ICD-10-CM | POA: Diagnosis not present

## 2021-04-01 DIAGNOSIS — J9611 Chronic respiratory failure with hypoxia: Secondary | ICD-10-CM | POA: Diagnosis not present

## 2021-04-01 NOTE — Patient Instructions (Signed)

## 2021-04-01 NOTE — Progress Notes (Signed)
[facility] [place], [place] [address]  Pulmonary Sleep Medicine   Office Visit Note  Patient Name: [name] DOB: 1946-07-09 MRN [id number]  Date of Service: 04/01/2021  Complaints/HPI: COPD. She is on oxygen. Has noted that she has some SOB and slow down a bit as far as her breathing is concerned. Denies having chest pain. Her A fib has been controlled. Her sats run in the mid 90s she states. She has noted a cough and she has no sputum. When she does it is basically clear. Also of note she has been having some dysphagia and chokes on her food at times. Admitted to the hospital in May for anemia  ROS  General: (-) fever, (-) chills, (-) night sweats, (-) weakness Skin: (-) rashes, (-) itching,. Eyes: (-) visual changes, (-) redness, (-) itching. Nose and Sinuses: (-) nasal stuffiness or itchiness, (-) postnasal drip, (-) nosebleeds, (-) sinus trouble. Mouth and Throat: (-) sore throat, (-) hoarseness. Neck: (-) swollen glands, (-) enlarged thyroid, (-) neck pain. Respiratory: - cough, (-) bloody sputum, + shortness of breath, - wheezing. Cardiovascular: - ankle swelling, (-) chest pain. Lymphatic: (-) lymph node enlargement. Neurologic: (-) numbness, (-) tingling. Psychiatric: (-) anxiety, (-) depression   Current Medication: Outpatient Encounter Medications as of 04/01/2021  Medication Sig Note   acetaminophen (TYLENOL) 500 MG tablet Take 500 mg by mouth every 4 (four) hours as needed for moderate pain or headache.     ALPRAZolam (XANAX) 0.25 MG tablet Take 1 tablet (0.25 mg total) by mouth 2 (two) times daily as needed for anxiety.    atorvastatin (LIPITOR) 40 MG tablet 1 tab po qhs    bumetanide (BUMEX) 1 MG tablet Take 2 tablets (2 mg total) by mouth 2 (two) times daily.    Calcium Carbonate-Vitamin D (CALCIUM 600+D PO) Take 1 tablet by mouth daily.    Coenzyme Q10 (COQ10) 200 MG CAPS Take 200 mg by mouth daily.    diclofenac Sodium (VOLTAREN)  1 % GEL SMARTSIG:Gram(s) Topical Twice Daily    ELIQUIS 5 MG TABS tablet Take 1 tablet by mouth twice daily. (Patient taking differently: Take 5 mg by mouth 2 (two) times daily.)    ferrous sulfate 325 (65 FE) MG tablet Take 2 tablets (650 mg total) by mouth 2 (two) times daily with a meal. (Patient taking differently: Take 650 mg by mouth 2 (two) times daily with a meal. Takes 4 tabs by mouth 2 times daily with a meal)    flecainide (TAMBOCOR) 50 MG tablet Take 1 tablet (50 mg total) by mouth 2 (two) times daily. PLEASE CONTACT OFFICE [phone] TO SCHEDULE APPOINTMENT FOR FUTURE REFILLS    levothyroxine (SYNTHROID) 25 MCG tablet Take 1 tablet (25 mcg total) by mouth daily before breakfast.    lisinopril (ZESTRIL) 40 MG tablet Take 1 tablet (40 mg total) by mouth daily. 01/29/2021: Pt had today at 1100   loratadine (CLARITIN) 10 MG tablet Take 10 mg by mouth daily.    oxyCODONE-acetaminophen (PERCOCET) 7.5-325 MG tablet Take 0.5-1 tablets by mouth daily as needed for severe pain. Take half to one tab po qd prn for pain    pantoprazole (PROTONIX) 40 MG tablet Take 1 tablet (40 mg total) by mouth daily.    potassium chloride (KLOR-CON) 10 MEQ tablet Take 4 tablets (40 mEq total) by mouth 2 (two) times daily.    sertraline (ZOLOFT) 50 MG tablet Take 1 tablet (50 mg total) by mouth daily.  traZODone (DESYREL) 50 MG tablet Take 1 tablet (50 mg total) by mouth at bedtime.    TRELEGY ELLIPTA 100-62.5-25 MCG/INH AEPB Inhale 1 puff by mouth into the lungs daily.    VENTOLIN HFA 108 (90 Base) MCG/ACT inhaler INHALE 2 PUFFS INTO THE LUNGS EVERY 6 HOURS AS NEEDED FOR WHEEZING OR SHORTNESS OF BREATH    No facility-administered encounter medications on file as of 04/01/2021.    Surgical History: Past Surgical History:  Procedure Laterality Date   ABDOMINAL AORTIC ANEURYSM REPAIR  2008   ABDOMINAL HYSTERECTOMY     AORTIC VALVE REPLACEMENT  2008   CARDIAC CATHETERIZATION     [place]   CARDIOVERSION N/A  08/15/2018   Procedure: CARDIOVERSION;  Surgeon: [name], MD;  Location: [facility] OR[facility];  Service: Cardiovascular;  Laterality: N/A;   CARDIOVERSION N/A 11/14/2018   Procedure: CARDIOVERSION (CATH LAB);  Surgeon: [name], MD;  Location: [facility];  Service: Cardiovascular;  Laterality: N/A;   CARDIOVERSION N/A 06/05/2019   Procedure: CARDIOVERSION;  Surgeon: [name], MD;  Location: [facility] OR[facility];  Service: Cardiovascular;  Laterality: N/A;   CARDIOVERSION N/A 08/14/2019   Procedure: CARDIOVERSION;  Surgeon: [name], MD;  Location: [facility];  Service: Cardiovascular;  Laterality: N/A;   CARDIOVERSION N/A 11/09/2019   Procedure: CARDIOVERSION;  Surgeon: Isaias Cowman, MD;  Location: [facility];  Service: Cardiovascular;  Laterality: N/A;   CARDIOVERSION N/A 01/17/2020   Procedure: CARDIOVERSION;  Surgeon: Isaias Cowman, MD;  Location: [facility];  Service: Cardiovascular;  Laterality: N/A;   CARPAL TUNNEL RELEASE     ELECTROPHYSIOLOGIC STUDY N/A 08/17/2016   Procedure: Cardioversion;  Surgeon: [name], MD;  Location: [facility];  Service: Cardiovascular;  Laterality: N/A;   TUMOR EXCISION Left    x3 (arm)    Medical History: Past Medical History:  Diagnosis Date   Aortic 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 s/p bioprosthetic valve replacement 2008 at [facility];  b. 01/2015 Echo: EF 60-65%, no rwma, Gr1 DD, mildly dil LA, nl RV fxn.   Atrial fibrillation with RVR ([place]) 01/11/2015   Atrial flutter ([place])    a. 08/2016 s/p DCCV.  Remains on flecainide 50 mg bid.   Basal skull fracture ([name]) 20 yrs ago   CHF (congestive heart failure) ([name])    Chronic respiratory failure ([name])    COPD (chronic obstructive pulmonary disease) ([place])    a. 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 2008 prior to Aortic aneurysm repair-->nl cors.   History of  stress test    a. 10/2015 MV: no ischemia/infarct.   HLD (hyperlipidemia)    HTN (hypertension)    Hypothyroidism    Obesity    PAF (paroxysmal atrial fibrillation) ([name])    a. on Eliquis; b. CHADS2VASc = 3 (HTN, age x 50, female).   Paroxysmal atrial fibrillation ([place]) 01/24/2015   Right upper quadrant abdominal tenderness without rebound tenderness 03/02/2018   S/P ascending aortic aneurysm repair 2008    Family History: Family History  Problem Relation Age of Onset   Stroke Father    Stroke Paternal Grandmother     Social History: Social History   Socioeconomic History   Marital status: Single    Spouse name: Not on file   Number of children: Not on file  Years of education: Not on file   Highest education level: Not on file  Occupational History   Not on file  Tobacco Use   Smoking status: Former    Types: Cigarettes   Smokeless tobacco: Never  Vaping Use   Vaping Use: Never used  Substance and Sexual Activity   Alcohol use: No   Drug use: No   Sexual activity: Never    Birth control/protection: Surgical  Other Topics Concern   Not on file  Social History Narrative   Not on file   Social Determinants of Health   Financial Resource Strain: Not on file  Food Insecurity: Not on file  Transportation Needs: Not on file  Physical Activity: Not on file  Stress: Not on file  Social Connections: Not on file  Intimate Partner Violence: Not on file    Vital Signs: Blood pressure 118/60, pulse 82, temperature 98.7 F (37.1 C), resp. rate 16, height _0  (1.473 m), weight 184 lb 12.8 oz (83.8 kg), SpO2 93 %.  Examination: General Appearance: The patient is well-developed, well-nourished, and in no distress. Skin: Gross inspection of skin unremarkable. Head: normocephalic, no gross deformities. Eyes: no gross deformities noted. ENT: ears appear grossly normal no exudates. Neck: Supple. No thyromegaly. No LAD. Respiratory: no rhonchi noted. Cardiovascular:  Normal S1 and S2 without murmur or rub. Extremities: No cyanosis. pulses are equal. Neurologic: Alert and oriented. No involuntary movements.  LABS: Recent Results (from the past 2160 hour(s))  Protime-INR     Status: None   Collection Time: 01/29/21  5:11 PM  Result Value Ref Range   Prothrombin Time 14.5 11.4 - 15.2 seconds   INR 1.1 0.8 - 1.2    Comment: (NOTE) INR goal varies based on device and disease states. Performed at [facility], [address] [place], [place] [address]   APTT     Status: None   Collection Time: 01/29/21  5:11 PM  Result Value Ref Range   aPTT 34 24 - 36 seconds    Comment: Performed at [facility], [address] [place], [place] [address]  CBC     Status: Abnormal   Collection Time: 01/29/21  5:11 PM  Result Value Ref Range   WBC 7.8 4.0 - 10.5 K/uL   RBC 3.00 (L) 3.87 - 5.11 MIL/uL   Hemoglobin 8.7 (L) 12.0 - 15.0 g/dL   HCT 27.3 (L) 36.0 - 46.0 %   MCV 91.0 80.0 - 100.0 fL   MCH 29.0 26.0 - 34.0 pg   MCHC 31.9 30.0 - 36.0 g/dL   RDW 16.6 (H) 11.5 - 15.5 %   Platelets 269 150 - 400 K/uL   nRBC 0.0 0.0 - 0.2 %    Comment: Performed at [facility], [address] [place], [place] [address]  Differential     Status: None   Collection Time: 01/29/21  5:11 PM  Result Value Ref Range   Neutrophils Relative % 74 %   Neutro Abs 5.7 1.7 - 7.7 K/uL   Lymphocytes Relative 13 %   Lymphs Abs 1.0 0.7 - 4.0 K/uL   Monocytes Relative 12 %   Monocytes Absolute 0.9 0.1 - 1.0 K/uL   Eosinophils Relative 1 %   Eosinophils Absolute 0.1 0.0 - 0.5 K/uL   Basophils Relative 0 %   Basophils Absolute 0.0 0.0 - 0.1 K/uL   Immature Granulocytes 0 %   Abs Immature Granulocytes 0.03 0.00 - 0.07 K/uL  Comment: Performed at [facility], [address] [place], [place] [address]  Comprehensive metabolic panel     Status: Abnormal   Collection Time: 01/29/21  5:11 PM  Result Value Ref Range   Sodium 136 135 - 145  mmol/L   Potassium 4.2 3.5 - 5.1 mmol/L   Chloride 100 98 - 111 mmol/L   CO2 26 22 - 32 mmol/L   Glucose, Bld 110 (H) 70 - 99 mg/dL    Comment: Glucose reference range applies only to samples taken after fasting for at least 8 hours.   BUN 23 8 - 23 mg/dL   Creatinine, Ser 0.88 0.44 - 1.00 mg/dL   Calcium 8.5 (L) 8.9 - 10.3 mg/dL   Total Protein 6.0 (L) 6.5 - 8.1 g/dL   Albumin 3.3 (L) 3.5 - 5.0 g/dL   AST 22 15 - 41 U/L   ALT 16 0 - 44 U/L   Alkaline Phosphatase 55 38 - 126 U/L   Total Bilirubin 0.6 0.3 - 1.2 mg/dL   GFR, Estimated >60 >60 mL/min    Comment: (NOTE) Calculated using the CKD-EPI Creatinine Equation (2021)    Anion gap 10 5 - 15    Comment: Performed at [facility], [address], [place], [place] [address]  Resp Panel by RT-PCR (Flu A&B, Covid) Nasopharyngeal Swab     Status: None   Collection Time: 01/29/21  5:11 PM   Specimen: Nasopharyngeal Swab; Nasopharyngeal(NP) swabs in vial transport medium  Result Value Ref Range   SARS Coronavirus 2 by RT PCR NEGATIVE NEGATIVE    Comment: (NOTE) SARS-CoV-2 target nucleic acids are NOT DETECTED.  The SARS-CoV-2 RNA is generally detectable in upper respiratory specimens during the acute phase of infection. The lowest concentration of SARS-CoV-2 viral copies this assay can detect is 138 copies/mL. A negative result does not preclude SARS-Cov-2 infection and should not be used as the sole basis for treatment or other patient management decisions. A negative result may occur with  improper specimen collection/handling, submission of specimen other than nasopharyngeal swab, presence of viral mutation(s) within the areas targeted by this assay, and inadequate number of viral copies(<138 copies/mL). A negative result must be combined with clinical observations, patient history, and epidemiological information. The expected result is Negative.  Fact Sheet for Patients:   EntrepreneurPulse.com.au  Fact Sheet for Healthcare Providers:  IncredibleEmployment.be  This test is no t yet approved or cleared by the [place] FDA and  has been authorized for detection and/or diagnosis of SARS-CoV-2 by FDA under an Emergency Use Authorization (EUA). This EUA will remain  in effect (meaning this test can be used) for the duration of the COVID-19 declaration under Section 564(b)(1) of the Act, 21 U.S.C.section 360bbb-3(b)(1), unless the authorization is terminated  or revoked sooner.       Influenza A by PCR NEGATIVE NEGATIVE   Influenza B by PCR NEGATIVE NEGATIVE    Comment: (NOTE) The Xpert Xpress SARS-CoV-2/FLU/RSV plus assay is intended as an aid in the diagnosis of influenza from Nasopharyngeal swab specimens and should not be used as a sole basis for treatment. Nasal washings and aspirates are unacceptable for Xpert Xpress SARS-CoV-2/FLU/RSV testing.  Fact Sheet for Patients: EntrepreneurPulse.com.au  Fact Sheet for Healthcare Providers: IncredibleEmployment.be  This test is not yet approved or cleared by the [place] FDA and has been authorized for detection and/or diagnosis of SARS-CoV-2 by FDA under an Emergency Use Authorization (EUA). This EUA will remain in effect (meaning this  test can be used) for the duration of the COVID-19 declaration under Section 564(b)(1) of the Act, 21 U.S.C. section 360bbb-3(b)(1), unless the authorization is terminated or revoked.  Performed at [facility], [place], [place] [address]   Troponin I (High Sensitivity)     Status: None   Collection Time: 01/29/21  5:11 PM  Result Value Ref Range   Troponin I (High Sensitivity) 11 <18 ng/L    Comment: (NOTE) Elevated high sensitivity troponin I (hsTnI) values and significant  changes across serial measurements may suggest ACS but many other  chronic and  acute conditions are known to elevate hsTnI results.  Refer to the "Links" section for chest pain algorithms and additional  guidance. Performed at [facility], [address] [place], [place] [address]   Iron and TIBC     Status: Abnormal   Collection Time: 01/29/21  5:11 PM  Result Value Ref Range   Iron 38 28 - 170 ug/dL   TIBC 409 250 - 450 ug/dL   Saturation Ratios 9 (L) 10.4 - 31.8 %   UIBC 371 ug/dL    Comment: Performed at [facility], [address] [place], [place] [address]  Urinalysis, Complete w Microscopic Urine, Catheterized     Status: Abnormal   Collection Time: 01/29/21  7:10 PM  Result Value Ref Range   Color, Urine YELLOW (A) YELLOW   APPearance CLEAR (A) CLEAR   Specific Gravity, Urine 1.013 1.005 - 1.030   pH 6.0 5.0 - 8.0   Glucose, UA NEGATIVE NEGATIVE mg/dL   Hgb urine dipstick NEGATIVE NEGATIVE   Bilirubin Urine NEGATIVE NEGATIVE   Ketones, ur NEGATIVE NEGATIVE mg/dL   Protein, ur NEGATIVE NEGATIVE mg/dL   Nitrite NEGATIVE NEGATIVE   Leukocytes,Ua NEGATIVE NEGATIVE   RBC / HPF 0-5 0 - 5 RBC/hpf   WBC, UA 0-5 0 - 5 WBC/hpf   Bacteria, UA NONE SEEN NONE SEEN   Squamous Epithelial / LPF 0-5 0 - 5    Comment: Performed at [facility], [address] [place], [place] [address]  Basic metabolic panel     Status: Abnormal   Collection Time: 01/30/21 12:52 AM  Result Value Ref Range   Sodium 134 (L) 135 - 145 mmol/L   Potassium 3.3 (L) 3.5 - 5.1 mmol/L   Chloride 101 98 - 111 mmol/L   CO2 25 22 - 32 mmol/L   Glucose, Bld 134 (H) 70 - 99 mg/dL    Comment: Glucose reference range applies only to samples taken after fasting for at least 8 hours.   BUN 24 (H) 8 - 23 mg/dL   Creatinine, Ser 0.97 0.44 - 1.00 mg/dL   Calcium 8.6 (L) 8.9 - 10.3 mg/dL   GFR, Estimated >60 >60 mL/min    Comment: (NOTE) Calculated using the CKD-EPI Creatinine Equation (2021)    Anion gap 8 5 - 15    Comment: Performed at [facility], [address] [place], [place] [address]  CBC     Status: Abnormal   Collection Time: 01/30/21 12:52 AM  Result Value Ref Range   WBC 7.0 4.0 - 10.5 K/uL   RBC 3.01 (L) 3.87 - 5.11 MIL/uL   Hemoglobin 8.6 (L) 12.0 - 15.0 g/dL   HCT 26.9 (L) 36.0 - 46.0 %   MCV 89.4 80.0 - 100.0 fL   MCH 28.6 26.0 - 34.0 pg   MCHC 32.0 30.0 - 36.0 g/dL   RDW  16.8 (H) 11.5 - 15.5 %   Platelets 243 150 - 400 K/uL   nRBC 0.0 0.0 - 0.2 %    Comment: Performed at [facility], [address] [place], [place] [address]  Phosphorus     Status: None   Collection Time: 01/30/21 12:52 AM  Result Value Ref Range   Phosphorus 3.5 2.5 - 4.6 mg/dL    Comment: Performed at [facility], [address] [place], [place] [address]  Magnesium     Status: None   Collection Time: 01/30/21 12:52 AM  Result Value Ref Range   Magnesium 2.1 1.7 - 2.4 mg/dL    Comment: Performed at [facility], [address] [place], [place] [address]  Ferritin     Status: None   Collection Time: 01/30/21 12:52 AM  Result Value Ref Range   Ferritin 21 11 - 307 ng/mL    Comment: Performed at [facility], [address] [place], [place] [address]  Basic Metabolic Panel (BMET)     Status: None   Collection Time: 03/11/21 11:14 AM  Result Value Ref Range   Glucose 93 65 - 99 mg/dL   BUN 13 8 - 27 mg/dL   Creatinine, Ser 0.90 0.57 - 1.00 mg/dL   eGFR 68 >59 mL/min/1.73   BUN/Creatinine Ratio 14 12 - 28   Sodium 138 134 - 144 mmol/L   Potassium 4.0 3.5 - 5.2 mmol/L   Chloride 99 96 - 106 mmol/L   CO2 25 20 - 29 mmol/L   Calcium 9.0 8.7 - 10.3 mg/dL  CBC with Differential/Platelet     Status: Abnormal   Collection Time: 03/11/21 11:14 AM  Result Value Ref Range   WBC 7.3 3.4 - 10.8 x10E3/uL   RBC 2.99 (L) 3.77 - 5.28 x10E6/uL   Hemoglobin 8.8 (L) 11.1 - 15.9 g/dL   Hematocrit 28.2 (L) 34.0 - 46.6 %   MCV 94 79 - 97 fL   MCH 29.4 26.6 - 33.0 pg   MCHC 31.2 (L) 31.5 - 35.7 g/dL    RDW 16.6 (H) 11.7 - 15.4 %   Platelets 312 150 - 450 x10E3/uL   Neutrophils 72 Not Estab. %   Lymphs 13 Not Estab. %   Monocytes 13 Not Estab. %   Eos 2 Not Estab. %   Basos 0 Not Estab. %   Neutrophils Absolute 5.3 1.4 - 7.0 x10E3/uL   Lymphocytes Absolute 0.9 0.7 - 3.1 x10E3/uL   Monocytes Absolute 0.9 0.1 - 0.9 x10E3/uL   EOS (ABSOLUTE) 0.1 0.0 - 0.4 x10E3/uL   Basophils Absolute 0.0 0.0 - 0.2 x10E3/uL   Immature Granulocytes 0 Not Estab. %   Immature Grans (Abs) 0.0 0.0 - 0.1 x10E3/uL  B12 and Folate Panel     Status: None   Collection Time: 03/11/21 11:14 AM  Result Value Ref Range   Vitamin B-12 887 232 - 1,245 pg/mL   Folate >20.0 >3.0 ng/mL    Comment: A serum folate concentration of less than 3.1 ng/mL is considered to represent clinical deficiency.   Fe+TIBC+Fer     Status: Abnormal   Collection Time: 03/11/21 11:14 AM  Result Value Ref Range   Total Iron Binding Capacity 405 250 - 450 ug/dL   UIBC 248 118 - 369 ug/dL   Iron 157 (H) 27 - 139 ug/dL   Iron Saturation 39 15 - 55 %   Ferritin 70 15 - 150 ng/mL  Hgb Fractionation Cascade  Status: None   Collection Time: 03/14/21  3:06 PM  Result Value Ref Range   Hgb F 0.0 0.0 - 2.0 %   Hgb A 98.0 96.4 - 98.8 %   Hgb A2 2.0 1.8 - 3.2 %   Hgb S 0.0 0.0 %   Interpretation, Hgb Fract Comment     Comment: (NOTE) Normal hemoglobin present; no hemoglobin variant or beta thalassemia identified. Note: Alpha thalassemia may not be detected by the Hgb Fractionation Cascade panel. If alpha thalassemia is suspected, [facility] offers Alpha-Thalassemia DNA Analysis [phone]). Performed At: [facility] [place], [place] [id number] Rush Farmer MD [id number]   TSH     Status: None   Collection Time: 03/14/21  3:32 PM  Result Value Ref Range   TSH 2.406 0.350 - 4.500 uIU/mL    Comment: Performed by a 3rd Generation assay with a functional sensitivity of <=0.01 uIU/mL. Performed at [facility], [address] [place], [place] [address]   Comprehensive metabolic panel     Status: Abnormal   Collection Time: 03/14/21  3:32 PM  Result Value Ref Range   Sodium 133 (L) 135 - 145 mmol/L   Potassium 3.7 3.5 - 5.1 mmol/L   Chloride 96 (L) 98 - 111 mmol/L   CO2 28 22 - 32 mmol/L   Glucose, Bld 93 70 - 99 mg/dL    Comment: Glucose reference range applies only to samples taken after fasting for at least 8 hours.   BUN 17 8 - 23 mg/dL   Creatinine, Ser 0.81 0.44 - 1.00 mg/dL   Calcium 9.0 8.9 - 10.3 mg/dL   Total Protein 6.9 6.5 - 8.1 g/dL   Albumin 3.7 3.5 - 5.0 g/dL   AST 17 15 - 41 U/L   ALT 13 0 - 44 U/L   Alkaline Phosphatase 66 38 - 126 U/L   Total Bilirubin 0.5 0.3 - 1.2 mg/dL   GFR, Estimated >60 >60 mL/min    Comment: (NOTE) Calculated using the CKD-EPI Creatinine Equation (2021)    Anion gap 9 5 - 15    Comment: Performed at [facility], [address] [place], [place] [address]  CBC with Differential/Platelet     Status: Abnormal   Collection Time: 03/14/21  3:32 PM  Result Value Ref Range   WBC 6.5 4.0 - 10.5 K/uL   RBC 2.84 (L) 3.87 - 5.11 MIL/uL   Hemoglobin 8.4 (L) 12.0 - 15.0 g/dL   HCT 28.7 (L) 36.0 - 46.0 %   MCV 101.1 (H) 80.0 - 100.0 fL   MCH 29.6 26.0 - 34.0 pg   MCHC 29.3 (L) 30.0 - 36.0 g/dL   RDW 18.3 (H) 11.5 - 15.5 %   Platelets 292 150 - 400 K/uL   nRBC 0.0 0.0 - 0.2 %   Neutrophils Relative % 72 %   Neutro Abs 4.7 1.7 - 7.7 K/uL   Lymphocytes Relative 14 %   Lymphs Abs 0.9 0.7 - 4.0 K/uL   Monocytes Relative 11 %   Monocytes Absolute 0.7 0.1 - 1.0 K/uL   Eosinophils Relative 1 %   Eosinophils Absolute 0.1 0.0 - 0.5 K/uL   Basophils Relative 1 %   Basophils Absolute 0.0 0.0 - 0.1 K/uL   Immature Granulocytes 1 %   Abs Immature Granulocytes 0.03 0.00 - 0.07 K/uL    Comment: Performed at [facility], [address] [place],  [address]  Protein electrophoresis, serum  Status: None   Collection  Time: 03/14/21  3:32 PM  Result Value Ref Range   Total Protein ELP 6.1 6.0 - 8.5 g/dL   Albumin ELP 3.6 2.9 - 4.4 g/dL   Alpha-1-Globulin 0.3 0.0 - 0.4 g/dL   Alpha-2-Globulin 0.6 0.4 - 1.0 g/dL   Beta Globulin 1.0 0.7 - 1.3 g/dL   Gamma Globulin 0.6 0.4 - 1.8 g/dL   M-Spike, % Not Observed Not Observed g/dL   SPE Interp. Comment     Comment: (NOTE) The SPE pattern appears unremarkable. Evidence of monoclonal protein is not apparent. Performed At: [facility] [place], [place] [id number] Rush Farmer MD [id number]    Comment Comment     Comment: (NOTE) Protein electrophoresis scan will follow via computer, mail, or courier delivery.    Globulin, Total 2.5 2.2 - 3.9 g/dL   A/G Ratio 1.4 0.7 - 1.7  ANA w/Reflex     Status: None   Collection Time: 03/14/21  3:32 PM  Result Value Ref Range   Anti Nuclear Antibody (ANA) Negative Negative    Comment: (NOTE) Performed At: [facility] [place], [place] [id number] Rush Farmer MD [id number]   DAT, polyspecific, [facility])     Status: None   Collection Time: 03/14/21  3:32 PM  Result Value Ref Range   Polyspecific AHG test      NEG Performed at [facility], [address] [place], [place] [address]   Folic Acid     Status: None   Collection Time: 03/14/21  3:32 PM  Result Value Ref Range   Folate 60.0 >5.9 ng/mL    Comment: RESULT CONFIRMED BY MANUAL DILUTION DLB Performed at [facility], [address] [place], [place] [address]   Haptoglobin     Status: None   Collection Time: 03/14/21  3:32 PM  Result Value Ref Range   Haptoglobin 117 42 - 346 mg/dL    Comment: (NOTE) Performed At: [facility] [place], [place] [id number] Rush Farmer MD [id number]   Vitamin B12     Status: None   Collection Time: 03/14/21  3:32 PM  Result Value Ref Range   Vitamin B-12 631 180 - 914 pg/mL    Comment: (NOTE) This assay is  not validated for testing neonatal or myeloproliferative syndrome specimens for Vitamin B12 levels. Performed at [facility], [place] [address] [place], [place] [address]   Erythropoietin     Status: Abnormal   Collection Time: 03/14/21  3:32 PM  Result Value Ref Range   Erythropoietin 128.7 (H) 2.6 - 18.5 mIU/mL    Comment: (NOTE) [employer] UniCel DxI 800 Immunoassay System Values obtained with different assay methods or kits cannot be used interchangeably. Results cannot be interpreted as absolute evidence of the presence or absence of malignant disease. Performed At: [facility] [place], [place] [id number] Rush Farmer MD [id number]   Iron and TIBC     Status: Abnormal   Collection Time: 03/14/21  3:32 PM  Result Value Ref Range   Iron 156 28 - 170 ug/dL   TIBC 463 (H) 250 - 450 ug/dL   Saturation Ratios 34 (H) 10.4 - 31.8 %   UIBC 307 ug/dL    Comment: Performed at [facility], [address], [place], [place] [address]  Ferritin     Status: None   Collection Time: 03/14/21  3:32 PM  Result Value Ref Range  Ferritin 42 11 - 307 ng/mL    Comment: Performed at [facility], [address] [place], [place] [address]  Sedimentation rate     Status: Abnormal   Collection Time: 03/14/21  3:32 PM  Result Value Ref Range   Sed Rate 70 (H) 0 - 30 mm/hr    Comment: Performed at [facility], [address] [place], [place] [address]  Reticulocytes     Status: Abnormal   Collection Time: 03/14/21  3:32 PM  Result Value Ref Range   Retic Ct Pct 7.4 (H) 0.4 - 3.1 %   RBC. 2.81 (L) 3.87 - 5.11 MIL/uL   Retic Count, Absolute 206.5 (H) 19.0 - 186.0 K/uL   Immature Retic Fract 35.0 (H) 2.3 - 15.9 %    Comment: Performed at [facility], [address], [place], [place] [address]  Technologist smear review     Status: None   Collection Time: 03/14/21  3:34 PM  Result Value Ref Range   WBC MORPHOLOGY  MORPHOLOGY UNREMARKABLE    RBC MORPHOLOGY POLYCHROMASIA PRESENT     Comment: MIXED RBC POLPULATION  ANISOCYTOSIS    Tech Review Normal platelet morphology     Comment: PLATELETS APPEAR ADEQUATE Performed at [facility], [address] [place], [place] [address]   Basic metabolic panel     Status: Abnormal   Collection Time: 03/17/21  7:17 PM  Result Value Ref Range   Sodium 136 135 - 145 mmol/L   Potassium 4.6 3.5 - 5.1 mmol/L    Comment: HEMOLYSIS AT THIS LEVEL MAY AFFECT RESULT   Chloride 99 98 - 111 mmol/L   CO2 29 22 - 32 mmol/L   Glucose, Bld 101 (H) 70 - 99 mg/dL    Comment: Glucose reference range applies only to samples taken after fasting for at least 8 hours.   BUN 18 8 - 23 mg/dL   Creatinine, Ser 0.93 0.44 - 1.00 mg/dL   Calcium 9.3 8.9 - 10.3 mg/dL   GFR, Estimated >60 >60 mL/min    Comment: (NOTE) Calculated using the CKD-EPI Creatinine Equation (2021)    Anion gap 8 5 - 15    Comment: Performed at [facility], [address] [place], [place] [address]  CBC     Status: Abnormal   Collection Time: 03/17/21  9:05 PM  Result Value Ref Range   WBC 7.4 4.0 - 10.5 K/uL   RBC 3.24 (L) 3.87 - 5.11 MIL/uL   Hemoglobin 9.9 (L) 12.0 - 15.0 g/dL   HCT 32.5 (L) 36.0 - 46.0 %   MCV 100.3 (H) 80.0 - 100.0 fL   MCH 30.6 26.0 - 34.0 pg   MCHC 30.5 30.0 - 36.0 g/dL   RDW 17.8 (H) 11.5 - 15.5 %   Platelets 350 150 - 400 K/uL   nRBC 0.0 0.0 - 0.2 %    Comment: Performed at [facility], [place], [place] [address]  Troponin I (High Sensitivity)     Status: None   Collection Time: 03/17/21  9:05 PM  Result Value Ref Range   Troponin I (High Sensitivity) 13 <18 ng/L    Comment: (NOTE) Elevated high sensitivity troponin I (hsTnI) values and significant  changes across serial measurements may suggest ACS but many other  chronic and acute conditions are known to elevate hsTnI results.  Refer to the "Links" section for chest pain  algorithms and additional  guidance. Performed at [facility],  [place], [place] [address]   Brain natriuretic peptide     Status: Abnormal   Collection Time: 03/17/21  9:05 PM  Result Value Ref Range   B Natriuretic Peptide 267.5 (H) 0.0 - 100.0 pg/mL    Comment: Performed at [facility], [address], [place], [place] [address]    Radiology: DG Chest 2 View  Result Date: 03/17/2021 CLINICAL DATA:  Shortness of breath EXAM: CHEST - 2 VIEW COMPARISON:  05/11/2020 FINDINGS: Left-sided implanted cardiac device in stable positioning. Prior median sternotomy and prosthetic heart valve. Stable cardiomediastinal contours. Atherosclerotic calcification of the aortic knob. Mildly prominent bibasilar interstitial markings. No pleural effusion or pneumothorax. IMPRESSION: Mildly prominent bibasilar interstitial markings, which may reflect atelectasis or mild edema. Electronically Signed   By: Davina Poke D.O.   On: 03/17/2021 16:52    No results found.  DG Chest 2 View  Result Date: 03/17/2021 CLINICAL DATA:  Shortness of breath EXAM: CHEST - 2 VIEW COMPARISON:  05/11/2020 FINDINGS: Left-sided implanted cardiac device in stable positioning. Prior median sternotomy and prosthetic heart valve. Stable cardiomediastinal contours. Atherosclerotic calcification of the aortic knob. Mildly prominent bibasilar interstitial markings. No pleural effusion or pneumothorax. IMPRESSION: Mildly prominent bibasilar interstitial markings, which may reflect atelectasis or mild edema. Electronically Signed   By: Davina Poke D.O.   On: 03/17/2021 16:52      Assessment and Plan: Patient Active Problem List   Diagnosis Date Noted   Chronic anticoagulation 03/14/2021   Hx of atrioventricular node ablation 03/14/2021   Symptomatic anemia 01/29/2021   Acute on chronic diastolic CHF (congestive heart failure) ([place]) 05/11/2020   COPD (chronic obstructive pulmonary disease)  ([name])    Hypothyroidism    Cardiac pacemaker in situ 05/10/2020   Acute non-recurrent frontal sinusitis 04/22/2020   Vertigo 04/22/2020   S/P placement of cardiac pacemaker 02/21/2020   Atrial fibrillation status post cardioversion ([place]) 11/16/2019   Episode of moderate major depression ([place]) 10/10/2019   Persistent atrial fibrillation ([place])    Encounter for general adult medical examination with abnormal findings 07/10/2019   Encounter for screening mammogram for malignant neoplasm of breast 07/10/2019   Atopic dermatitis 05/02/2019   Paroxysmal atrial flutter ([place]) 08/11/2018   Encounter for long-term (current) use of medications 07/09/2018   Chronic left shoulder pain 07/09/2018   Conjunctivitis 07/09/2018   Oxygen dependent 07/09/2018   GAD (generalized anxiety disorder) 07/09/2018   Ovarian failure 07/09/2018   Positive colorectal cancer screening using Cologuard test 04/24/2018   Calculus of gallbladder with acute on chronic cholecystitis 04/08/2018   H/O: CVA (cerebrovascular accident) 04/08/2018   Dysuria 03/02/2018   Urinary tract infection without hematuria 03/02/2018   Right upper quadrant abdominal tenderness without rebound tenderness 03/02/2018   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 exacerbation ([place]) 03/02/2018   Chronic obstructive pulmonary disease ([place]) 09/19/2017   Typical atrial flutter ([name])    Irritable bowel syndrome without diarrhea 01/29/2015   Essential hypertension 01/24/2015   Paroxysmal atrial fibrillation ([place]) 01/24/2015   History of aortic valve replacement with bioprosthetic valve 01/24/2015   Atrial fibrillation with RVR ([place]) 01/11/2015   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  Shortness of breath Done spiro reviewed with the patient - Spirometry with Graph  2. Chronic  respiratory failure with hypoxia ([name]) On oxygen good sats. Maintain >90%  3. PAF (paroxysmal atrial fibrillation) ([name]) Rate controlled will follow with Dr Josefa Half  4. Anemia in other chronic diseases classified elsewhere Follow up PCP   General Counseling: I have discussed the findings of the evaluation and examination with Antonieta.  I have also discussed any further diagnostic evaluation thatmay be needed or ordered today. Lempi verbalizes understanding of the findings of todays visit. We also reviewed her medications today and discussed drug interactions and side effects including but not limited excessive drowsiness and altered mental states. We also discussed that there is always a risk not just to her but also people around her. she has been encouraged to call the office with any questions or concerns that should arise related to todays visit.  Orders Placed This Encounter  Procedures   Spirometry with Graph    Order Specific Question:   Where should this test be performed?    Answer:   [facility]    Order Specific Question:   Basic spirometry    Answer:   Yes    Order Specific Question:   Spirometry pre & post bronchodilator    Answer:   No     Time spent: 46  I have personally obtained a history, examined the patient, evaluated laboratory and imaging results, formulated the assessment and plan and placed orders.    Allyne Gee, MD [facility]

## 2021-04-04 ENCOUNTER — Inpatient Hospital Stay: Payer: Medicare Other

## 2021-04-04 ENCOUNTER — Inpatient Hospital Stay (HOSPITAL_BASED_OUTPATIENT_CLINIC_OR_DEPARTMENT_OTHER): Payer: Medicare Other | Admitting: Internal Medicine

## 2021-04-04 ENCOUNTER — Encounter: Payer: Self-pay | Admitting: Internal Medicine

## 2021-04-04 ENCOUNTER — Other Ambulatory Visit: Payer: Self-pay

## 2021-04-04 VITALS — BP 131/61 | HR 73 | Temp 98.0°F | Resp 18 | Ht <= 58 in | Wt 188.0 lb

## 2021-04-04 DIAGNOSIS — D649 Anemia, unspecified: Secondary | ICD-10-CM

## 2021-04-04 LAB — CBC WITH DIFFERENTIAL/PLATELET
Abs Immature Granulocytes: 0.02 10*3/uL (ref 0.00–0.07)
Basophils Absolute: 0.1 10*3/uL (ref 0.0–0.1)
Basophils Relative: 1 %
Eosinophils Absolute: 0.1 10*3/uL (ref 0.0–0.5)
Eosinophils Relative: 1 %
HCT: 32.5 % — ABNORMAL LOW (ref 36.0–46.0)
Hemoglobin: 9.9 g/dL — ABNORMAL LOW (ref 12.0–15.0)
Immature Granulocytes: 0 %
Lymphocytes Relative: 13 %
Lymphs Abs: 1 10*3/uL (ref 0.7–4.0)
MCH: 29.8 pg (ref 26.0–34.0)
MCHC: 30.5 g/dL (ref 30.0–36.0)
MCV: 97.9 fL (ref 80.0–100.0)
Monocytes Absolute: 0.8 10*3/uL (ref 0.1–1.0)
Monocytes Relative: 11 %
Neutro Abs: 5.3 10*3/uL (ref 1.7–7.7)
Neutrophils Relative %: 74 %
Platelets: 326 10*3/uL (ref 150–400)
RBC: 3.32 MIL/uL — ABNORMAL LOW (ref 3.87–5.11)
RDW: 15.4 % (ref 11.5–15.5)
WBC: 7.3 10*3/uL (ref 4.0–10.5)
nRBC: 0 % (ref 0.0–0.2)

## 2021-04-04 LAB — LACTATE DEHYDROGENASE: LDH: 155 U/L (ref 98–192)

## 2021-04-04 LAB — RETICULOCYTES
Immature Retic Fract: 24 % — ABNORMAL HIGH (ref 2.3–15.9)
RBC.: 3.29 MIL/uL — ABNORMAL LOW (ref 3.87–5.11)
Retic Count, Absolute: 151.3 10*3/uL (ref 19.0–186.0)
Retic Ct Pct: 4.6 % — ABNORMAL HIGH (ref 0.4–3.1)

## 2021-04-04 LAB — PATHOLOGIST SMEAR REVIEW

## 2021-04-04 NOTE — Progress Notes (Signed)
[facility] NOTE  Patient Care Team: [name], MD as PCP - General (Internal Medicine) [name], MD as PCP - Cardiology (Cardiology)  CHIEF COMPLAINTS/PURPOSE OF CONSULTATION: Anemia  #Chronic intermittent anemia since 2017-mild; since 2022-hemoglobin 8-9; MCV 100s; July 0762-UQJF normal; folic acid H54/TGY/BWLSLHTDSKA/JGO-TLXBWI normal limits; iron saturation 32% ferritin 42.  TSH normal;  elevated reticulocyte count/elevated erythropoietin level.  Pathologist review of smear-no schistocytes or spherocytes/hemolysis  # COPD [Dr.Saadat Khan/ CHF- Dr.Parashoes]; 2008 [bioprosthetic valve aortic] Oncology History   No history exists.     HISTORY OF PRESENTING ILLNESS:  [name] 75 y.o.  female patient with multiple medical problems including aortic stenosis bioprosthetic valve; A. fib on Coumadin; chronic respiratory failure [CHF/COPD]-on 3.5 L of oxygen; with morbid obesity is here for follow-up of anemia.  Patient denies any nausea vomiting.  Complains of fatigue.  Denies any chest pain.  Complains of shortness of breath on exertion.  No blood in stools or black-colored stools.  Review of Systems  Constitutional:  Positive for malaise/fatigue. Negative for chills, diaphoresis, fever and weight loss.  HENT:  Negative for nosebleeds and sore throat.   Eyes:  Negative for double vision.  Respiratory:  Positive for cough and shortness of breath. Negative for hemoptysis, sputum production and wheezing.   Cardiovascular:  Negative for chest pain, palpitations, orthopnea and leg swelling.  Gastrointestinal:  Negative for abdominal pain, blood in stool, constipation, diarrhea, heartburn, melena, nausea and vomiting.  Genitourinary:  Negative for dysuria, frequency and urgency.  Musculoskeletal:  Negative for back pain and joint pain.  Skin: Negative.  Negative for itching and rash.  Neurological:  Negative for dizziness, tingling, focal weakness,  weakness and headaches.  Endo/Heme/Allergies:  Does not bruise/bleed easily.  Psychiatric/Behavioral:  Negative for depression. The patient is not nervous/anxious and does not have insomnia.     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 s/p bioprosthetic valve replacement 2008 at [facility];  b. 01/2015 Echo: EF 60-65%, no rwma, Gr1 DD, mildly dil LA, nl RV fxn.   Atrial fibrillation with RVR ([place]) 01/11/2015   Atrial flutter ([place])    a. 08/2016 s/p DCCV.  Remains on flecainide 50 mg bid.   Basal skull fracture ([name]) 20 yrs ago   CHF (congestive heart failure) ([name])    Chronic respiratory failure ([name])    COPD (chronic obstructive pulmonary disease) ([place])    a. 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 2008 prior to Aortic aneurysm repair-->nl cors.   History of stress test    a. 10/2015 MV: no ischemia/infarct.   HLD (hyperlipidemia)    HTN (hypertension)    Hypothyroidism    Obesity    PAF (paroxysmal atrial fibrillation) ([name])    a. on Eliquis; b. CHADS2VASc = 3 (HTN, age x 45, female).   Paroxysmal atrial fibrillation ([place]) 01/24/2015   Right upper quadrant abdominal tenderness without rebound tenderness 03/02/2018   S/P ascending aortic aneurysm repair 2008    SURGICAL HISTORY: Past Surgical History:  Procedure Laterality Date   ABDOMINAL AORTIC ANEURYSM REPAIR  2008   ABDOMINAL HYSTERECTOMY     AORTIC VALVE REPLACEMENT  2008   CARDIAC CATHETERIZATION     [facility]   CARDIOVERSION  N/A 08/15/2018   Procedure: CARDIOVERSION;  Surgeon: [name], MD;  Location: [facility] ORS;  Service: Cardiovascular;  Laterality: N/A;   CARDIOVERSION N/A 11/14/2018   Procedure: CARDIOVERSION (CATH LAB);  Surgeon: [name], MD;  Location: [facility] ORS;  Service: Cardiovascular;  Laterality: N/A;   CARDIOVERSION N/A  06/05/2019   Procedure: CARDIOVERSION;  Surgeon: [name], MD;  Location: [facility] ORS;  Service: Cardiovascular;  Laterality: N/A;   CARDIOVERSION N/A 08/14/2019   Procedure: CARDIOVERSION;  Surgeon: [name], MD;  Location: [facility] ORS;  Service: Cardiovascular;  Laterality: N/A;   CARDIOVERSION N/A 11/09/2019   Procedure: CARDIOVERSION;  Surgeon: Isaias Cowman, MD;  Location: [facility];  Service: Cardiovascular;  Laterality: N/A;   CARDIOVERSION N/A 01/17/2020   Procedure: CARDIOVERSION;  Surgeon: Isaias Cowman, MD;  Location: [facility] ORS;  Service: Cardiovascular;  Laterality: N/A;   CARPAL TUNNEL RELEASE     ELECTROPHYSIOLOGIC STUDY N/A 08/17/2016   Procedure: Cardioversion;  Surgeon: [name], MD;  Location: [facility] ORS;  Service: Cardiovascular;  Laterality: N/A;   TUMOR EXCISION Left    x3 (arm)    SOCIAL HISTORY: Social History   Socioeconomic History   Marital status: Single    Spouse name: Not on file   Number of children: Not on file   Years of education: Not on file   Highest education level: Not on file  Occupational History   Not on file  Tobacco Use   Smoking status: Former    Types: Cigarettes   Smokeless tobacco: Never  Vaping Use   Vaping Use: Never used  Substance and Sexual Activity   Alcohol use: No   Drug use: No   Sexual activity: Never    Birth control/protection: Surgical  Other Topics Concern   Not on file  Social History Narrative   Not on file   Social Determinants of Health   Financial Resource Strain: Not on file  Food Insecurity: Not on file  Transportation Needs: Not on file  Physical Activity: Not on file  Stress: Not on file  Social Connections: Not on file  Intimate Partner Violence: Not on file    FAMILY HISTORY: Family History  Problem Relation Age of Onset   Stroke Father    Stroke Paternal Grandmother     ALLERGIES:  is allergic to benadryl [diphenhydramine hcl (sleep)], cetirizine, lasix  [furosemide], levaquin [levofloxacin in d5w], meloxicam, soy allergy, and sulfa antibiotics.  MEDICATIONS:  Current Outpatient Medications  Medication Sig Dispense Refill   acetaminophen (TYLENOL) 500 MG tablet Take 500 mg by mouth every 4 (four) hours as needed for moderate pain or headache.      ALPRAZolam (XANAX) 0.25 MG tablet Take 1 tablet (0.25 mg total) by mouth 2 (two) times daily as needed for anxiety. 60 tablet 1   atorvastatin (LIPITOR) 40 MG tablet 1 tab po qhs 90 tablet 1   bumetanide (BUMEX) 1 MG tablet Take 2 tablets (2 mg total) by mouth 2 (two) times daily. 360 tablet 3   Calcium Carbonate-Vitamin D (CALCIUM 600+D PO) Take 1 tablet by mouth daily.     Coenzyme Q10 (COQ10) 200 MG CAPS Take 200 mg by mouth daily.     diclofenac Sodium (VOLTAREN) 1 % GEL SMARTSIG:Gram(s) Topical Twice Daily     ELIQUIS 5 MG TABS tablet Take 1 tablet by mouth twice daily. (Patient taking differently: Take 5 mg by mouth 2 (two) times daily.) 180 tablet 2   ferrous sulfate 325 (65 FE) MG  tablet Take 2 tablets (650 mg total) by mouth 2 (two) times daily with a meal. (Patient taking differently: Take 650 mg by mouth 2 (two) times daily with a meal. Takes 4 tabs by mouth 2 times daily with a meal)  3   flecainide (TAMBOCOR) 50 MG tablet Take 1 tablet (50 mg total) by mouth 2 (two) times daily. PLEASE CONTACT OFFICE [phone] TO SCHEDULE APPOINTMENT FOR FUTURE REFILLS 30 tablet 0   levothyroxine (SYNTHROID) 25 MCG tablet Take 1 tablet (25 mcg total) by mouth daily before breakfast. 90 tablet 1   lisinopril (ZESTRIL) 40 MG tablet Take 1 tablet (40 mg total) by mouth daily. 90 tablet 3   loratadine (CLARITIN) 10 MG tablet Take 10 mg by mouth daily.     oxyCODONE-acetaminophen (PERCOCET) 7.5-325 MG tablet Take 0.5-1 tablets by mouth daily as needed for severe pain. Take half to one tab po qd prn for pain 30 tablet 0   pantoprazole (PROTONIX) 40 MG tablet Take 1 tablet (40 mg total) by mouth daily. 90 tablet  1   potassium chloride (KLOR-CON) 10 MEQ tablet Take 4 tablets (40 mEq total) by mouth 2 (two) times daily. 720 tablet 3   sertraline (ZOLOFT) 50 MG tablet Take 1 tablet (50 mg total) by mouth daily. 90 tablet 1   traZODone (DESYREL) 50 MG tablet Take 1 tablet (50 mg total) by mouth at bedtime. 90 tablet 0   TRELEGY ELLIPTA 100-62.5-25 MCG/INH AEPB Inhale 1 puff by mouth into the lungs daily. 60 each 2   VENTOLIN HFA 108 (90 Base) MCG/ACT inhaler INHALE 2 PUFFS INTO THE LUNGS EVERY 6 HOURS AS NEEDED FOR WHEEZING OR SHORTNESS OF BREATH 18 g 3   No current facility-administered medications for this visit.      [name]  PHYSICAL EXAMINATION: ECOG PERFORMANCE STATUS: 1 - Symptomatic but completely ambulatory  Vitals:   04/04/21 1310  BP: 131/61  Pulse: 73  Resp: 18  Temp: 98 F (36.7 C)  SpO2: 94%   Filed Weights   04/04/21 1310  Weight: 188 lb (85.3 kg)    Physical Exam Vitals and nursing note reviewed.  Constitutional:      Comments: Accompanied by sister; Ambulating with wheelchair-nasal cannula oxygen 3.5 L.      HENT:     Head: Normocephalic and atraumatic.     Mouth/Throat:     Pharynx: Oropharynx is clear.  Eyes:     Extraocular Movements: Extraocular movements intact.     Pupils: Pupils are equal, round, and reactive to light.  Cardiovascular:     Rate and Rhythm: Normal rate and regular rhythm.  Pulmonary:     Comments: Decreased breath sounds bilaterally.  Abdominal:     Palpations: Abdomen is soft.  Musculoskeletal:        General: Normal range of motion.     Cervical back: Normal range of motion.  Skin:    General: Skin is warm.  Neurological:     General: No focal deficit present.     Mental Status: She is alert and oriented to person, place, and time.  Psychiatric:        Behavior: Behavior normal.        Judgment: Judgment normal.    LABORATORY DATA:  I have reviewed the data as listed Lab Results  Component Value Date   WBC 7.3 04/04/2021    HGB 9.9 (L) 04/04/2021   HCT 32.5 (L) 04/04/2021   MCV 97.9 04/04/2021   PLT 326 04/04/2021  Recent Labs    05/12/20 0551 05/13/20 0549 05/14/20 0714 08/06/20 1413 11/25/20 1102 01/29/21 1711 01/30/21 0052 03/11/21 1114 03/14/21 1532 03/17/21 1917  NA 135 138 136   < > 140 136 134* 138 133* 136  K 3.2* 2.8* 2.8*   < > 4.2 4.2 3.3* 4.0 3.7 4.6  CL 95* 97* 95*   < > 99 100 101 99 96* 99  CO2 31 33* 32   < > _0 GLUCOSE 114* 85 93   < > 97 110* 134* 93 93 101*  BUN 19 25* 25*   < > 19 23 24* _1 CREATININE 0.89 0.82 0.81   < > 0.86 0.88 0.97 0.90 0.81 0.93  CALCIUM 9.3 8.9 8.6*   < > 9.8 8.5* 8.6* 9.0 9.0 9.3  GFRNONAA >60 >60 >60   < >  --  >60 >60  --  >60 >60  GFRAA >60 >60 >60  --   --   --   --   --   --   --   PROT  --   --   --   --  6.6 6.0*  --   --  6.9  --   ALBUMIN  --   --   --   --  4.0 3.3*  --   --  3.7  --   AST  --   --   --   --  18 22  --   --  17  --   ALT  --   --   --   --  14 16  --   --  13  --   ALKPHOS  --   --   --   --  94 55  --   --  66  --   BILITOT  --   --   --   --  0.2 0.6  --   --  0.5  --    < > = values in this interval not displayed.    RADIOGRAPHIC STUDIES: I have personally reviewed the radiological images as listed and agreed with the findings in the report. DG Chest 2 View  Result Date: 03/17/2021 CLINICAL DATA:  Shortness of breath EXAM: CHEST - 2 VIEW COMPARISON:  05/11/2020 FINDINGS: Left-sided implanted cardiac device in stable positioning. Prior median sternotomy and prosthetic heart valve. Stable cardiomediastinal contours. Atherosclerotic calcification of the aortic knob. Mildly prominent bibasilar interstitial markings. No pleural effusion or pneumothorax. IMPRESSION: Mildly prominent bibasilar interstitial markings, which may reflect atelectasis or mild edema. Electronically Signed   By: Davina Poke D.O.   On: 03/17/2021 16:52    ASSESSMENT & PLAN:   Symptomatic anemia # MACROCYTIC ANEMIA:  Etiology is unclear-no obvious evidence of iron deficiency; elevated reticulocyte count; with normal haptoglobin.  Question valvular dysfunction.  Repeat LDH/haptoglobin/peripheral smear.  Plasma hemoglobin.  # COPD/CHF- s/p Valve- bioprosthetoc [2008] / OSA- on CPAP- 3.5 lits   # DISPOSITION: will call sis Sister- pat- [phone] # labs today-ordeed- CBC; pathology review of smear; retic count; LDH; haptoglobin  # follow up TBD-Dr.B  Addendum: on 08/04-I spoke to patient's Sister Fraser Din that it is unlikely patient is having hemolysis given normal LDH/normal haptoglobin-from bioprosthetic valve.  However elevated reticulocyte count cannot still be explained.  Discussed that would recommend a bone marrow biopsy if hemoglobin continues to drop; for now it is stable.  Recommend follow-up-in 2 months-MD labs CBC; CMP  LDH reticulocyte count; [facility] flow cytometry-Dr.B  All questions were answered. The patient knows to call the clinic with any problems, questions or concerns.     Cammie Sickle, MD 04/10/2021 7:10 PM

## 2021-04-04 NOTE — Assessment & Plan Note (Addendum)
#  MACROCYTIC ANEMIA: Etiology is unclear-no obvious evidence of iron deficiency; elevated reticulocyte count; with normal haptoglobin.  Question valvular dysfunction.  Repeat LDH/haptoglobin/peripheral smear.  Plasma hemoglobin.  # COPD/CHF- s/p Valve- bioprosthetoc [2008] / OSA- on CPAP- 3.5 lits   # DISPOSITION: will call sis Sister- pat- [phone] # labs today-ordeed- CBC; pathology review of smear; retic count; LDH; haptoglobin  # follow up TBD-Dr.B  Addendum: on 08/04-I spoke to patient's Sister [name] that it is unlikely patient is having hemolysis given normal LDH/normal haptoglobin-from bioprosthetic valve.  However elevated reticulocyte count cannot still be explained.  Discussed that would recommend a bone marrow biopsy if hemoglobin continues to drop; for now it is stable.  Recommend follow-up-in 2 months-MD labs CBC; CMP LDH reticulocyte count; [facility] flow cytometry-Dr.B

## 2021-04-05 LAB — HAPTOGLOBIN: Haptoglobin: 138 mg/dL (ref 42–346)

## 2021-04-07 ENCOUNTER — Telehealth: Payer: Self-pay

## 2021-04-07 NOTE — Telephone Encounter (Signed)
Left vm for patient to return call to reschedule 817/22 appointment-Toni

## 2021-04-08 ENCOUNTER — Ambulatory Visit: Payer: Medicare Other | Admitting: Internal Medicine

## 2021-04-08 DIAGNOSIS — H524 Presbyopia: Secondary | ICD-10-CM | POA: Diagnosis not present

## 2021-04-08 DIAGNOSIS — H2513 Age-related nuclear cataract, bilateral: Secondary | ICD-10-CM | POA: Diagnosis not present

## 2021-04-09 ENCOUNTER — Ambulatory Visit: Payer: Medicare Other

## 2021-04-10 ENCOUNTER — Other Ambulatory Visit: Payer: Self-pay

## 2021-04-11 ENCOUNTER — Telehealth: Payer: Self-pay | Admitting: *Deleted

## 2021-04-11 ENCOUNTER — Telehealth: Payer: Self-pay

## 2021-04-11 DIAGNOSIS — G8929 Other chronic pain: Secondary | ICD-10-CM

## 2021-04-11 DIAGNOSIS — D649 Anemia, unspecified: Secondary | ICD-10-CM

## 2021-04-11 MED ORDER — OXYCODONE-ACETAMINOPHEN 7.5-325 MG PO TABS: 0.5000 | ORAL_TABLET | Freq: Every day | ORAL | 0 refills | Status: DC | PRN

## 2021-04-11 NOTE — Telephone Encounter (Signed)
Med sent to pharmacy.

## 2021-04-11 NOTE — Telephone Encounter (Signed)
Recommend follow-up-in 2 months-MD labs CBC; CMP LDH reticulocyte count; [facility] cytometry; copper, zincDr.B   Co[place]- please schedule above apts.  Lab orders have been entered.

## 2021-04-15 DIAGNOSIS — I712 Thoracic aortic aneurysm, without rupture: Secondary | ICD-10-CM | POA: Diagnosis not present

## 2021-04-15 DIAGNOSIS — I4891 Unspecified atrial fibrillation: Secondary | ICD-10-CM | POA: Diagnosis not present

## 2021-04-15 DIAGNOSIS — Q231 Congenital insufficiency of aortic valve: Secondary | ICD-10-CM | POA: Diagnosis not present

## 2021-04-15 DIAGNOSIS — Z950 Presence of cardiac pacemaker: Secondary | ICD-10-CM | POA: Diagnosis not present

## 2021-04-21 ENCOUNTER — Telehealth: Payer: Self-pay

## 2021-04-21 ENCOUNTER — Ambulatory Visit: Payer: Medicare Other | Admitting: Gastroenterology

## 2021-04-21 DIAGNOSIS — F411 Generalized anxiety disorder: Secondary | ICD-10-CM

## 2021-04-21 MED ORDER — ALPRAZOLAM 0.25 MG PO TABS
0.2500 mg | ORAL_TABLET | Freq: Two times a day (BID) | ORAL | 2 refills | Status: DC | PRN
Start: 2021-04-21 — End: 2021-11-25

## 2021-04-21 NOTE — Telephone Encounter (Signed)
Rx sent 

## 2021-04-21 NOTE — Telephone Encounter (Signed)
error 

## 2021-04-23 ENCOUNTER — Ambulatory Visit: Payer: Medicare Other | Admitting: Internal Medicine

## 2021-04-29 ENCOUNTER — Other Ambulatory Visit: Payer: Self-pay

## 2021-04-29 ENCOUNTER — Encounter: Payer: Self-pay | Admitting: Internal Medicine

## 2021-04-29 ENCOUNTER — Ambulatory Visit (INDEPENDENT_AMBULATORY_CARE_PROVIDER_SITE_OTHER): Payer: Medicare Other | Admitting: Internal Medicine

## 2021-04-29 ENCOUNTER — Encounter (INDEPENDENT_AMBULATORY_CARE_PROVIDER_SITE_OTHER): Payer: Self-pay

## 2021-04-29 VITALS — BP 124/70 | HR 75 | Temp 98.3°F | Resp 16 | Ht <= 58 in | Wt 186.6 lb

## 2021-04-29 DIAGNOSIS — J9611 Chronic respiratory failure with hypoxia: Secondary | ICD-10-CM

## 2021-04-29 DIAGNOSIS — M25512 Pain in left shoulder: Secondary | ICD-10-CM | POA: Diagnosis not present

## 2021-04-29 DIAGNOSIS — D638 Anemia in other chronic diseases classified elsewhere: Secondary | ICD-10-CM

## 2021-04-29 DIAGNOSIS — G894 Chronic pain syndrome: Secondary | ICD-10-CM | POA: Insufficient documentation

## 2021-04-29 DIAGNOSIS — Z789 Other specified health status: Secondary | ICD-10-CM | POA: Insufficient documentation

## 2021-04-29 DIAGNOSIS — M899 Disorder of bone, unspecified: Secondary | ICD-10-CM | POA: Insufficient documentation

## 2021-04-29 DIAGNOSIS — I480 Paroxysmal atrial fibrillation: Secondary | ICD-10-CM

## 2021-04-29 DIAGNOSIS — F418 Other specified anxiety disorders: Secondary | ICD-10-CM

## 2021-04-29 DIAGNOSIS — G8929 Other chronic pain: Secondary | ICD-10-CM

## 2021-04-29 DIAGNOSIS — I700 Atherosclerosis of aorta: Secondary | ICD-10-CM

## 2021-04-29 DIAGNOSIS — Z79899 Other long term (current) drug therapy: Secondary | ICD-10-CM | POA: Insufficient documentation

## 2021-04-29 DIAGNOSIS — D518 Other vitamin B12 deficiency anemias: Secondary | ICD-10-CM | POA: Diagnosis not present

## 2021-04-29 NOTE — Progress Notes (Signed)
[facility] [place], [place] [address]  Internal MEDICINE  Office Visit Note  Patient Name: [name]  [id number]  [id number]  Date of Service: 05/07/2021  Chief Complaint  Patient presents with   Anxiety    4 week follow up    HPI [name] 75 y.o.  female patient with multiple medical problems including aortic stenosis bioprosthetic valve; A. fib on Eliquis  chronic respiratory failure [CHF/COPD]-on 3.5 L of oxygen; with morbid obesity Patient feels down and depressed due to her activity level going down she says that she is not happy with her activity level and she is unable to do the things that she used to before.  However she does not want to increase her dose of Zoloft she continues to take alprazolam as needed. Patient also continues to have chronic shoulder pain left greater than right she does see Ortho for intra-articular corticosteroid injections however does require pain control with oxycodone.  Patient is compliant with this therapy and does not take more than prescribed amount. She does take trazodone for sleep continues to see pulmonology and also cardiology for other medical problems she does have a symptomatic anemia due to chronic iron deficiency deficiency and has been followed by hematology   Current Medication: Outpatient Encounter Medications as of 04/29/2021  Medication Sig Note   ALPRAZolam (XANAX) 0.25 MG tablet Take 1 tablet (0.25 mg total) by mouth 2 (two) times daily as needed for anxiety.    atorvastatin (LIPITOR) 40 MG tablet 1 tab po qhs    bumetanide (BUMEX) 1 MG tablet Take 2 tablets (2 mg total) by mouth 2 (two) times daily.    Calcium Carbonate-Vitamin D (CALCIUM 600+D PO) Take 1 tablet by mouth daily.    Coenzyme Q10 (COQ10) 200 MG CAPS Take 200 mg by mouth daily.    diclofenac Sodium (VOLTAREN) 1 % GEL SMARTSIG:Gram(s) Topical Twice Daily    ELIQUIS 5 MG TABS tablet Take 1 tablet by mouth twice daily. (Patient  taking differently: Take 5 mg by mouth 2 (two) times daily.)    ferrous sulfate 325 (65 FE) MG tablet Take 2 tablets (650 mg total) by mouth 2 (two) times daily with a meal. (Patient taking differently: Take 325 mg by mouth. Takes 4 tablets by mouth twice a day)    flecainide (TAMBOCOR) 50 MG tablet Take 1 tablet (50 mg total) by mouth 2 (two) times daily. PLEASE CONTACT OFFICE [phone] TO SCHEDULE APPOINTMENT FOR FUTURE REFILLS    levothyroxine (SYNTHROID) 25 MCG tablet Take 1 tablet (25 mcg total) by mouth daily before breakfast.    lisinopril (ZESTRIL) 40 MG tablet Take 1 tablet (40 mg total) by mouth daily. 01/29/2021: Pt had today at 1100   loratadine (CLARITIN) 10 MG tablet Take 10 mg by mouth daily.    pantoprazole (PROTONIX) 40 MG tablet Take 1 tablet (40 mg total) by mouth daily.    potassium chloride (KLOR-CON) 10 MEQ tablet Take 4 tablets (40 mEq total) by mouth 2 (two) times daily.    sertraline (ZOLOFT) 50 MG tablet Take 1 tablet (50 mg total) by mouth daily.    traZODone (DESYREL) 50 MG tablet Take 1 tablet (50 mg total) by mouth at bedtime.    TRELEGY ELLIPTA 100-62.5-25 MCG/INH AEPB Inhale 1 puff by mouth into the lungs daily.    VENTOLIN HFA 108 (90 Base) MCG/ACT inhaler INHALE 2 PUFFS INTO THE LUNGS EVERY 6 HOURS AS NEEDED FOR WHEEZING OR SHORTNESS OF  BREATH    [DISCONTINUED] acetaminophen (TYLENOL) 500 MG tablet Take 500 mg by mouth every 4 (four) hours as needed for moderate pain or headache.     [DISCONTINUED] oxyCODONE-acetaminophen (PERCOCET) 7.5-325 MG tablet Take 0.5-1 tablets by mouth daily as needed for severe pain or moderate pain.    No facility-administered encounter medications on file as of 04/29/2021.    Surgical History: Past Surgical History:  Procedure Laterality Date   ABDOMINAL AORTIC ANEURYSM REPAIR  2008   ABDOMINAL HYSTERECTOMY     AORTIC VALVE REPLACEMENT  2008   CARDIAC CATHETERIZATION     [place]   CARDIOVERSION N/A 08/15/2018   Procedure:  CARDIOVERSION;  Surgeon: [name], MD;  Location: [facility];  Service: Cardiovascular;  Laterality: N/A;   CARDIOVERSION N/A 11/14/2018   Procedure: CARDIOVERSION (CATH LAB);  Surgeon: [name], MD;  Location: [facility];  Service: Cardiovascular;  Laterality: N/A;   CARDIOVERSION N/A 06/05/2019   Procedure: CARDIOVERSION;  Surgeon: [name], MD;  Location: [facility];  Service: Cardiovascular;  Laterality: N/A;   CARDIOVERSION N/A 08/14/2019   Procedure: CARDIOVERSION;  Surgeon: [name], MD;  Location: [facility];  Service: Cardiovascular;  Laterality: N/A;   CARDIOVERSION N/A 11/09/2019   Procedure: CARDIOVERSION;  Surgeon: Isaias Cowman, MD;  Location: [facility];  Service: Cardiovascular;  Laterality: N/A;   CARDIOVERSION N/A 01/17/2020   Procedure: CARDIOVERSION;  Surgeon: Isaias Cowman, MD;  Location: [facility];  Service: Cardiovascular;  Laterality: N/A;   CARPAL TUNNEL RELEASE     ELECTROPHYSIOLOGIC STUDY N/A 08/17/2016   Procedure: Cardioversion;  Surgeon: [name], MD;  Location: [facility];  Service: Cardiovascular;  Laterality: N/A;   TUMOR EXCISION Left    x3 (arm)    Medical History: Past Medical History:  Diagnosis Date   Aortic 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 s/p bioprosthetic valve replacement 2008 at [facility];  b. 01/2015 Echo: EF 60-65%, no rwma, Gr1 DD, mildly dil LA, nl RV fxn.   Atrial fibrillation with RVR ([place]) 01/11/2015   Atrial flutter ([place])    a. 08/2016 s/p DCCV.  Remains on flecainide 50 mg bid.   Basal skull fracture ([name]) 20 yrs ago   CHF (congestive heart failure) ([name])    Chronic respiratory failure ([name])    COPD (chronic obstructive pulmonary disease) ([place])    a. 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 2008 prior to Aortic aneurysm repair-->nl cors.   History of stress test    a. 10/2015  MV: no ischemia/infarct.   HLD (hyperlipidemia)    HTN (hypertension)    Hypothyroidism    Obesity    PAF (paroxysmal atrial fibrillation) ([name])    a. on Eliquis; b. CHADS2VASc = 3 (HTN, age x 22, female).   Paroxysmal atrial fibrillation ([name]) 01/24/2015   Right upper quadrant abdominal tenderness without rebound tenderness 03/02/2018   S/P ascending aortic aneurysm repair 2008    Family History: Family History  Problem Relation Age of Onset   Stroke Father    Stroke Paternal Grandmother     Social History   Socioeconomic History   Marital status: Single    Spouse name: Not on file   Number of children: Not on file   Years of education: Not on file   Highest education  level: Not on file  Occupational History   Not on file  Tobacco Use   Smoking status: Former    Types: Cigarettes   Smokeless tobacco: Never  Vaping Use   Vaping Use: Never used  Substance and Sexual Activity   Alcohol use: No   Drug use: No   Sexual activity: Never    Birth control/protection: Surgical  Other Topics Concern   Not on file  Social History Narrative   Not on file   Social Determinants of Health   Financial Resource Strain: Not on file  Food Insecurity: Not on file  Transportation Needs: Not on file  Physical Activity: Not on file  Stress: Not on file  Social Connections: Not on file  Intimate Partner Violence: Not on file      Review of Systems  Constitutional:  Negative for chills, diaphoresis and fatigue.  HENT:  Negative for ear pain, postnasal drip and sinus pressure.   Eyes:  Negative for photophobia, discharge, redness, itching and visual disturbance.  Respiratory:  Negative for cough, shortness of breath and wheezing.   Cardiovascular:  Negative for chest pain, palpitations and leg swelling.  Gastrointestinal:  Negative for abdominal pain, constipation, diarrhea, nausea and vomiting.  Genitourinary:  Negative for dysuria and flank pain.  Musculoskeletal:  Negative for  arthralgias, back pain, gait problem and neck pain.  Skin:  Negative for color change.  Allergic/Immunologic: Negative for environmental allergies and food allergies.  Neurological:  Negative for dizziness and headaches.  Hematological:  Does not bruise/bleed easily.  Psychiatric/Behavioral:  Negative for agitation, behavioral problems (depression) and hallucinations.    Vital Signs: BP 124/70   Pulse 75   Temp 98.3 F (36.8 C)   Resp 16   Ht 4\' 10"  (1.473 m)   Wt 186 lb 9.6 oz (84.6 kg)   SpO2 92% Comment: 2 liters oxygen on portable.....3.5 liters oxyfen at home  BMI 39.00 kg/m    Physical Exam Constitutional:      Appearance: Normal appearance.  HENT:     Head: Normocephalic and atraumatic.     Nose: Nose normal.     Mouth/Throat:     Mouth: Mucous membranes are moist.     Pharynx: No posterior oropharyngeal erythema.  Eyes:     Extraocular Movements: Extraocular movements intact.     Pupils: Pupils are equal, round, and reactive to light.  Cardiovascular:     Pulses: Normal pulses.     Heart sounds: Normal heart sounds.  Pulmonary:     Effort: Pulmonary effort is normal.     Comments: Decreased bs bilateral  Musculoskeletal:        General: Tenderness present.  Neurological:     General: No focal deficit present.     Mental Status: She is alert.  Psychiatric:        Behavior: Behavior normal.     Comments: Anxious        Assessment/Plan: 1. Chronic left shoulder pain Requires pain control as needed we will continue on oxycodone as prescribed today patient is instructed to continue to use her Voltaren gel she cannot take NSAIDs due to high risk of GI bleed  2. Other vitamin B12 deficiency anemia Continue replacement therapy B12 monthly injections  3. Depression with anxiety Continue Zoloft 50 mg once a day might need increase in treatment and continue alprazolam as needed  4. Chronic respiratory failure with hypoxia ([name]) Followed by pulmonology she is  on 3.5 L of nasal cannula  5.  PAF (paroxysmal atrial fibrillation) ([place]) Followed by cardiology patient is on Eliquis  6. Anemia in other chronic diseases classified elsewhere Patient is followed by hematology  7. Aortic atherosclerosis ([place]) Continue Lipitor as before  General Counseling: Meila verbalizes understanding of the findings of todays visit and agrees with plan of treatment. I have discussed any further diagnostic evaluation that may be needed or ordered today. We also reviewed her medications today. she has been encouraged to call the office with any questions or concerns that should arise related to todays visit.    No orders of the defined types were placed in this encounter.   No orders of the defined types were placed in this encounter.   Total time spent:35 Minutes Time spent includes review of chart, medications, test results, and follow up plan with the patient.   [place] Controlled Substance Database was reviewed by me.   Dr [name] Internal medicine

## 2021-04-29 NOTE — Progress Notes (Signed)
Patient: [name]  Service Category: E/M  Provider: Gaspar Cola, MD  DOB: 1946-02-02  DOS: 04/30/2021  Referring Provider: Lattie Corns, [place]*  MRN: [id number]  Setting: Ambulatory outpatient  PCP: [name], MD  Type: New Patient  Specialty: Interventional Pain Management    Location: Office  Delivery: Face-to-face     Primary Reason(s) for Visit: Encounter for initial evaluation of one or more chronic problems (new to examiner) potentially causing chronic pain, and posing a threat to normal musculoskeletal function. (Level of risk: High) CC: Shoulder Pain  HPI  [name] is a 75 y.o. year old, female patient, who comes for the first time to our practice referred by Lattie Corns, PA* for our initial evaluation of her chronic pain. 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; Shoulder impingement syndrome (Left); Irritable bowel syndrome without diarrhea; MRSA (methicillin resistant Staphylococcus aureus); Dysuria; Urinary tract infection without hematuria; Right upper quadrant abdominal tenderness without rebound tenderness; 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 exacerbation ([place]); Positive colorectal cancer screening using Cologuard test; Calculus of gallbladder with acute on chronic cholecystitis; H/O: CVA (cerebrovascular accident); Encounter for long-term (current) use of medications; Chronic left shoulder pain; Conjunctivitis; Oxygen dependent; GAD (generalized anxiety disorder); Ovarian failure; Paroxysmal atrial flutter ([place]); Atopic dermatitis; Encounter for general adult medical examination with abnormal findings; Encounter for screening  mammogram for malignant neoplasm of breast; Persistent atrial fibrillation ([place]); Episode of moderate major depression ([place]); S/P placement of cardiac pacemaker; Acute non-recurrent frontal sinusitis; Vertigo; Acute on chronic diastolic CHF (congestive heart failure) ([place]); COPD (chronic obstructive pulmonary disease) ([place]); Hypothyroidism; Symptomatic anemia; Cardiac pacemaker in situ; Chronic anticoagulation ([place]); Atrial fibrillation status post cardioversion [facility]); Hx of atrioventricular node ablation; Chronic pain syndrome; Pharmacologic therapy; Disorder of skeletal system; Problems influencing health status; Long term prescription benzodiazepine use; Chronic shoulder pain (1ry area of Pain) (Bilateral) (L>R); Chronic upper extremity pain (2ry area of Pain) (Bilateral) (L>R); Cervicalgia; Chronic neck pain (3ry area of Pain) (Posterior) (Bilateral) (L>R); Shoulder blade pain (4th area of Pain) (Left); Osteoarthritis of glenohumeral joint (Left); Osteoarthritis of AC (acromioclavicular) joint (Left); Chronic headaches (5th area of Pain); Osteoarthritis of glenohumeral joints (Bilateral); Osteoarthritis of acromioclavicular joints (Bilateral); Primary osteoarthritis of shoulders (Bilateral); DDD (degenerative disc disease), cervical; Cervical radiculitis (Left); Cervical radiculopathy (Left); C6 radiculopathy (Left); C7 radiculopathy (Left); and Wheelchair bound on their problem list. Today she comes in for evaluation of her Shoulder Pain  Pain Assessment: Location: Left, Right Shoulder Radiating: pain radiaties down left shoulder and down her arm Onset: More than a month ago Duration: Chronic pain Quality: Aching, Burning, Constant, Sharp, Shooting Severity: 8 /10 (subjective, self-reported pain score)  Effect on ADL: limits my daily activities, hard to sleep Timing: Constant Modifying factors: meds, pain gel, laying down BP: 138/65  HR: 71  Onset and Duration: Sudden and Present longer than  3 months Cause of pain: Unknown Severity: Getting worse, NAS-11 at its worse: 10/10, NAS-11 at its best: 2/10, NAS-11 now: 8/10, and NAS-11 on the average: 2/10 Timing: Morning, Afternoon, Night, Not influenced by the time of the day, During activity or exercise, and After a period of immobility Aggravating Factors: Lifiting and Motion Alleviating Factors: Cold packs, Hot packs, Medications, and Resting Associated Problems: Constipation, Day-time cramps, Depression,  Dizziness, Fatigue, Nausea, Sadness, Sweating, Swelling, Pain that wakes patient up, and Pain that does not allow patient to sleep Quality of Pain: Aching, Agonizing, Annoying, Disabling, Distressing, Dreadful, Dull, Exhausting, Getting longer, Heavy, Horrible, Pressure-like, Sharp, Shooting, and Throbbing Previous Examinations or Tests: CT scan and X-rays Previous Treatments: Epidural steroid injections  According to the patient the primary area of pain is that of the shoulders (Bilateral) (L>R).  She denies any prior surgeries but she does admit to having had bilateral shoulder steroid injections every 3 months by Morley Kos, for the past 2 years, which provides her with approximately 2 weeks of pain relief.  She indicates having tried some home physical therapy but the physical therapist indicated that he may not be able to help her with her shoulder problems and provided her with some exercises that she could do on her own.  She indicates that she was unable to tolerate the exercises and she stopped the physical therapy.  Older x-rays available through the electronic medical record indicate the patient has bilateral osteoarthritis affecting the glenohumeral and acromioclavicular joints.  Exam of the shoulder shows the patient to be unable to abduct the left shoulder past 90 degrees.  The patient's secondary area pain is that of the upper extremities (Bilateral) (L>R).  Indicates of the right upper extremity the pain goes from the  shoulder to the elbow but does not go any further than that.  Indicates of the left upper extremity the pain goes from the shoulder all the way down into her fingers where she indicates it will affect her index and middle finger and what appears to be a C7/C6 dermatomal pattern.  The patient denies any upper extremity surgeries, x-rays, physical therapy, or nerve blocks or joint injections or other than those for the shoulder.  The patient's third area pain is that of the neck (Posterior) (Bilateral) (L>R).  The patient denies any surgeries, recent x-rays, physical therapy, or any kind of nerve blocks in the cervical region.  She denies any occipital headaches.  The patient's fourth area pain is that of the left shoulder blade.  No prior surgeries, x-rays, physical therapy, or nerve blocks in that area.  The patient's fifth area pain is that of the frontal headaches.  She denies any surgeries, x-rays, physical therapy, or nerve blocks for that particular pain.  In addition to the above, the patient has multiple medical problems including being wheelchair-bound secondary to her pain, having severe COPD and oxygen dependent running at 3.5 L/min.  Patient has a history of having been a smoker but having quit 14-1/2 years ago.  However, she has ended up with COPD, congestive heart failure, and she also required open heart surgery for an hour take valve replacement in 2008.  She has a pacemaker, a history of atrial fibrillation and atrial flutter that appears to persist despite cardioversion and atrioventricular node ablation.  She also has an aneurysm of the ascending aorta and she is on chronic anticoagulation with Eliquis.  Today I took the time to provide the patient with information regarding my pain practice. The patient was informed that my practice is divided into two sections: an interventional pain management section, as well as a completely separate and distinct medication management section. I  explained that I have procedure days for my interventional therapies, and evaluation days for follow-ups and medication management. Because of the amount of documentation required during both, they are kept separated. This means that there is the possibility that she may  be scheduled for a procedure on one day, and medication management the next. I have also informed her that because of staffing and facility limitations, I no longer take patients for medication management only. To illustrate the reasons for this, I gave the patient the example of surgeons, and how inappropriate it would be to refer a patient to his/her care, just to write for the post-surgical antibiotics on a surgery done by a different surgeon.   Because interventional pain management is my board-certified specialty, the patient was informed that joining my practice means that they are open to any and all interventional therapies. I made it clear that this does not mean that they will be forced to have any procedures done. What this means is that I believe interventional therapies to be essential part of the diagnosis and proper management of chronic pain conditions. Therefore, patients not interested in these interventional alternatives will be better served under the care of a different practitioner.  The patient was also made aware of my Comprehensive Pain Management Safety Guidelines where by joining my practice, they limit all of their nerve blocks and joint injections to those done by our practice, for as long as we are retained to manage their care.   Historic Controlled Substance Pharmacotherapy Review  PMP and historical list of controlled substances: Alprazolam 0.25 mg tablet, 1 tab p.o. twice daily; oxycodone/APAP 7.5/325, 1 tab p.o. daily; tramadol 50 mg tablet, 1 tab p.o. 3 times daily Current opioid analgesics: .   Oxycodone/APAP 7.5/325, 1 tab p.o. daily (last filled on 04/11/2021) MME/day: 7.5 mg/day   Historical  Monitoring: The patient  reports no history of drug use. List of all UDS Test(s): No results found for: MDMA, COCAINSCRNUR, [place], [place], CANNABQUANT, THCU, [place] List of other Serum/Urine Drug Screening Test(s):  No results found for: AMPHSCRSER, BARBSCRSER, BENZOSCRSER, COCAINSCRSER, COCAINSCRNUR, PCPSCRSER, PCPQUANT, THCSCRSER, THCU, CANNABQUANT, OPIATESCRSER, OXYSCRSER, PROPOXSCRSER, ETH Historical Background Evaluation: [place] PMP: PDMP reviewed during this encounter. Online review of the past 61-monthperiod conducted.             PMP NARX Score Report:  Narcotic: 401 Sedative: 401 Stimulant: 000 [place] Department of public safety, offender search: (Editor, commissioningInformation) Non-contributory Risk Assessment Profile: Aberrant behavior: None observed or detected today Risk factors for fatal opioid overdose: None identified today PMP NARX Overdose Risk Score: 330 Fatal overdose hazard ratio (HR): Calculation deferred Non-fatal overdose hazard ratio (HR): Calculation deferred Risk of opioid abuse or dependence: 0.7-3.0% with doses ? 36 MME/day and 6.1-26% with doses ? 120 MME/day. Substance use disorder (SUD) risk level: See below Personal History of Substance Abuse (SUD-Substance use disorder):  Alcohol: Negative  Illegal Drugs: Negative  Rx Drugs: Negative  ORT Risk Level calculation: Low Risk  Opioid Risk Tool - 04/30/21 1048       Family History of Substance Abuse   Alcohol Negative    Illegal Drugs Negative    Rx Drugs Negative      Personal History of Substance Abuse   Alcohol Negative    Illegal Drugs Negative    Rx Drugs Negative      Age   Age between 164-45years  No      History of Preadolescent Sexual Abuse   History of Preadolescent Sexual Abuse Negative or Female      Psychological Disease   Psychological Disease Negative    Depression Positive      Total Score   Opioid Risk Tool Scoring 1    Opioid Risk  Interpretation Low Risk            ORT Scoring  interpretation table:  Score <3 = Low Risk for SUD  Score between 4-7 = Moderate Risk for SUD  Score >8 = High Risk for Opioid Abuse   PHQ-2 Depression Scale:  Total score:    PHQ-2 Scoring interpretation table: (Score and probability of major depressive disorder)  Score 0 = No depression  Score 1 = 15.4% Probability  Score 2 = 21.1% Probability  Score 3 = 38.4% Probability  Score 4 = 45.5% Probability  Score 5 = 56.4% Probability  Score 6 = 78.6% Probability   PHQ-9 Depression Scale:  Total score:    PHQ-9 Scoring interpretation table:  Score 0-4 = No depression  Score 5-9 = Mild depression  Score 10-14 = Moderate depression  Score 15-19 = Moderately severe depression  Score 20-27 = Severe depression (2.4 times higher risk of SUD and 2.89 times higher risk of overuse)   Pharmacologic Plan: As per protocol, I have not taken over any controlled substance management, pending the results of ordered tests and/or consults.            Initial impression: Pending review of available data and ordered tests.  Meds   Current Outpatient Medications:    acetaminophen (TYLENOL) 500 MG tablet, Take 500 mg by mouth every 4 (four) hours as needed for moderate pain or headache. , Disp: , Rfl:    ALPRAZolam (XANAX) 0.25 MG tablet, Take 1 tablet (0.25 mg total) by mouth 2 (two) times daily as needed for anxiety., Disp: 60 tablet, Rfl: 2   atorvastatin (LIPITOR) 40 MG tablet, 1 tab po qhs, Disp: 90 tablet, Rfl: 1   bumetanide (BUMEX) 1 MG tablet, Take 2 tablets (2 mg total) by mouth 2 (two) times daily., Disp: 360 tablet, Rfl: 3   Calcium Carbonate-Vitamin D (CALCIUM 600+D PO), Take 1 tablet by mouth daily., Disp: , Rfl:    Coenzyme Q10 (COQ10) 200 MG CAPS, Take 200 mg by mouth daily., Disp: , Rfl:    diclofenac Sodium (VOLTAREN) 1 % GEL, SMARTSIG:Gram(s) Topical Twice Daily, Disp: , Rfl:    ELIQUIS 5 MG TABS tablet, Take 1 tablet by mouth twice daily. (Patient taking differently: Take 5 mg by  mouth 2 (two) times daily.), Disp: 180 tablet, Rfl: 2   ferrous sulfate 325 (65 FE) MG tablet, Take 2 tablets (650 mg total) by mouth 2 (two) times daily with a meal. (Patient taking differently: Take 325 mg by mouth. Takes 4 tablets by mouth twice a day), Disp: , Rfl: 3   flecainide (TAMBOCOR) 50 MG tablet, Take 1 tablet (50 mg total) by mouth 2 (two) times daily. PLEASE CONTACT OFFICE [phone] TO SCHEDULE APPOINTMENT FOR FUTURE REFILLS, Disp: 30 tablet, Rfl: 0   levothyroxine (SYNTHROID) 25 MCG tablet, Take 1 tablet (25 mcg total) by mouth daily before breakfast., Disp: 90 tablet, Rfl: 1   lisinopril (ZESTRIL) 40 MG tablet, Take 1 tablet (40 mg total) by mouth daily., Disp: 90 tablet, Rfl: 3   loratadine (CLARITIN) 10 MG tablet, Take 10 mg by mouth daily., Disp: , Rfl:    oxyCODONE-acetaminophen (PERCOCET) 7.5-325 MG tablet, Take 0.5-1 tablets by mouth daily as needed for severe pain or moderate pain., Disp: 30 tablet, Rfl: 0   pantoprazole (PROTONIX) 40 MG tablet, Take 1 tablet (40 mg total) by mouth daily., Disp: 90 tablet, Rfl: 1   potassium chloride (KLOR-CON) 10 MEQ tablet, Take 4 tablets (40 mEq  total) by mouth 2 (two) times daily., Disp: 720 tablet, Rfl: 3   sertraline (ZOLOFT) 50 MG tablet, Take 1 tablet (50 mg total) by mouth daily., Disp: 90 tablet, Rfl: 1   traZODone (DESYREL) 50 MG tablet, Take 1 tablet (50 mg total) by mouth at bedtime., Disp: 90 tablet, Rfl: 0   TRELEGY ELLIPTA 100-62.5-25 MCG/INH AEPB, Inhale 1 puff by mouth into the lungs daily., Disp: 60 each, Rfl: 2   VENTOLIN HFA 108 (90 Base) MCG/ACT inhaler, INHALE 2 PUFFS INTO THE LUNGS EVERY 6 HOURS AS NEEDED FOR WHEEZING OR SHORTNESS OF BREATH, Disp: 18 g, Rfl: 3  Imaging Review  Cervical Imaging: Cervical DG 2-3 views: Results for orders placed during the hospital encounter of 02/25/19 DG Cervical Spine 2-3 Views  Narrative CLINICAL DATA:  Posterior neck pain  EXAM: CERVICAL SPINE - 2-3 VIEW  COMPARISON:   04/23/2014  FINDINGS: Dens and lateral masses are within normal limits. Straightening of the cervical spine. Prominent degenerative changes C4-C5, C5-C6 and C6-C7.  IMPRESSION: Straightening of the cervical spine with marked degenerative change C4 through C7.   Electronically Signed By: Donavan Foil M.D. On: 02/25/2019 20:03  Shoulder Imaging: Shoulder-R DG: Results for orders placed during the hospital encounter of 12/12/20 DG Shoulder Right  Narrative CLINICAL DATA:  Acute on chronic RIGHT shoulder pain, RIGHT grip strength weakness, unable to lift RIGHT arm  EXAM: RIGHT SHOULDER - 2+ VIEW  COMPARISON:  04/23/2014  FINDINGS: Osseous demineralization.  AC joint alignment normal with mild degenerative changes noted.  Spur formation at the RIGHT glenohumeral joint.  Visualized ribs intact.  No acute fracture, dislocation, or bone destruction.  IMPRESSION: Progressive degenerative changes at RIGHT glenohumeral and RIGHT acromioclavicular joints.   Electronically Signed By: Lavonia Dana M.D. On: 12/12/2020 15:14  Shoulder-L DG: Results for orders placed during the hospital encounter of 07/11/18 DG Shoulder Left  Narrative CLINICAL DATA:  Left shoulder pain.  No injury.  EXAM: LEFT SHOULDER - 2+ VIEW  COMPARISON:  04/23/2014  FINDINGS: Exam demonstrates moderate degenerative changes of the glenohumeral joint with progression since the previous exam. Minimal degenerate change of the [facility] joint. No acute fracture or dislocation.  IMPRESSION: No acute findings.  Moderate degenerative changes of the glenohumeral joint and minimal degenerate change of the [facility] joint.   Electronically Signed By: Marin Olp M.D. On: 07/11/2018 14:30  Foot Imaging: Foot-R DG Complete: Results for orders placed during the hospital encounter of 02/14/20 DG Foot Complete Right  Narrative CLINICAL DATA:  Status post trauma.  EXAM: RIGHT FOOT COMPLETE - 3+  VIEW  COMPARISON:  None.  FINDINGS: There is no evidence of fracture or dislocation. Mild degenerative changes seen along the dorsal aspect of the mid right foot. Mild soft tissue swelling is seen along the dorsal aspect of the proximal to mid right foot.  IMPRESSION: Mild dorsal soft tissue swelling without evidence of acute fracture.   Electronically Signed By: Virgina Norfolk M.D. On: 02/13/2020 23:23  Complexity Note: Imaging results reviewed. Results shared with Ms. Marasigan, using Layman's terms.                        ROS  Cardiovascular: Heart trouble, High blood pressure, Heart surgery, Pacemaker or defibrillator, Heart failure, Weak heart (CHF), Heart valve problems, Heart catheterization, and Needs antibiotics prior to dental procedures Pulmonary or Respiratory: Lung problems, Difficulty blowing air out (Emphysema), Shortness of breath, and Temporary stoppage of breathing during sleep Neurological:  Stroke (with residual deficits or weakness) Psychological-Psychiatric: Anxiousness, Depressed, Prone to panicking, History of abuse, and Difficulty sleeping and or falling asleep Gastrointestinal: Heartburn due to stomach pushing into lungs (Hiatal hernia), Reflux or heatburn, and Alternating episodes iof diarrhea and constipation (IBS-Irritable bowe syndrome) Genitourinary: No reported renal or genitourinary signs or symptoms such as difficulty voiding or producing urine, peeing blood, non-functioning kidney, kidney stones, difficulty emptying the bladder, difficulty controlling the flow of urine, or chronic kidney disease Hematological: Weakness due to low blood hemoglobin or red blood cell count (Anemia), Brusing easily, and Bleeding easily Endocrine: Slow thyroid Rheumatologic: Joint aches and or swelling due to excess weight (Osteoarthritis) and Rheumatoid arthritis Musculoskeletal: Negative for myasthenia gravis, muscular dystrophy, multiple sclerosis or malignant  hyperthermia Work History: Disabled  Allergies  Ms. Emma is allergic to benadryl [diphenhydramine hcl (sleep)], cetirizine, lasix [furosemide], levaquin [levofloxacin in d5w], meloxicam, soy allergy, and sulfa antibiotics.  Laboratory Chemistry Profile   Renal Lab Results  Component Value Date   BUN 18 03/17/2021   CREATININE 0.93 03/17/2021   BCR 14 03/11/2021   GFRAA >60 05/14/2020   GFRNONAA >60 03/17/2021   SPECGRAV 1.014 07/10/2019   PHUR 7.5 07/10/2019   PROTEINUR NEGATIVE 01/29/2021     Electrolytes Lab Results  Component Value Date   NA 136 03/17/2021   K 4.6 03/17/2021   CL 99 03/17/2021   CALCIUM 9.3 03/17/2021   MG 2.1 01/30/2021   PHOS 3.5 01/30/2021     Hepatic Lab Results  Component Value Date   AST 17 03/14/2021   ALT 13 03/14/2021   ALBUMIN 3.7 03/14/2021   ALKPHOS 66 03/14/2021   LIPASE 124 10/16/2011     ID Lab Results  Component Value Date   SARSCOV2NAA NEGATIVE 01/29/2021     Bone Lab Results  Component Value Date   VD25OH 25.5 (L) 11/25/2020     Endocrine Lab Results  Component Value Date   GLUCOSE 101 (H) 03/17/2021   GLUCOSEU NEGATIVE 01/29/2021   HGBA1C 5.7 (H) 07/17/2019   TSH 2.406 03/14/2021   FREET4 1.20 11/25/2020     Neuropathy Lab Results  Component Value Date   VITAMINB12 631 03/14/2021   FOLATE 60.0 03/14/2021   HGBA1C 5.7 (H) 07/17/2019     CNS No results found for: COLORCSF, APPEARCSF, RBCCOUNTCSF, WBCCSF, POLYSCSF, LYMPHSCSF, EOSCSF, PROTEINCSF, GLUCCSF, JCVIRUS, CSFOLI, IGGCSF, LABACHR, ACETBL, LABACHR, ACETBL   Inflammation (CRP: Acute  ESR: Chronic) Lab Results  Component Value Date   ESRSEDRATE 70 (H) 03/14/2021     Rheumatology Lab Results  Component Value Date   ANA Negative 03/14/2021     Coagulation Lab Results  Component Value Date   INR 1.1 01/29/2021   LABPROT 14.5 01/29/2021   APTT 34 01/29/2021   PLT 326 04/04/2021     Cardiovascular Lab Results  Component Value Date   BNP  267.5 (H) 03/17/2021   CKTOTAL 107 08/14/2012   CKMB 0.7 08/14/2012   TROPONINI <0.03 10/31/2018   HGB 9.9 (L) 04/04/2021   HCT 32.5 (L) 04/04/2021     Screening Lab Results  Component Value Date   SARSCOV2NAA NEGATIVE 01/29/2021     Cancer No results found for: CEA, CA125, LABCA2   Allergens No results found for: ALMOND, APPLE, ASPARAGUS, AVOCADO, BANANA, BARLEY, BASIL, BAYLEAF, GREENBEAN, LIMABEAN, WHITEBEAN, BEEFIGE, REDBEET, BLUEBERRY, BROCCOLI, CABBAGE, MELON, CARROT, CASEIN, CASHEWNUT, CAULIFLOWER, CELERY     Note: Lab results reviewed.  PFSH  Drug: Ms. Boling  reports no history of drug use.  Alcohol:  reports no history of alcohol use. Tobacco:  reports that she has quit smoking. Her smoking use included cigarettes. She has never used smokeless tobacco. Medical:  has a past medical history of Aortic stenosis due to bicuspid aortic valve, Atrial fibrillation with RVR ([place]) (01/11/2015), Atrial flutter ([place]), Basal skull fracture ([name]) (20 yrs ago), CHF (congestive heart failure) ([place]), Chronic respiratory failure ([place]), COPD (chronic obstructive pulmonary disease) ([place]), Deafness in left ear, Essential hypertension (01/24/2015), History of cardiac cath, History of stress test, HLD (hyperlipidemia), HTN (hypertension), Hypothyroidism, Obesity, PAF (paroxysmal atrial fibrillation) ([place]), Paroxysmal atrial fibrillation ([place]) (01/24/2015), Right upper quadrant abdominal tenderness without rebound tenderness (03/02/2018), and S/P ascending aortic aneurysm repair (2008). Family: family history includes Stroke in her father and paternal grandmother.  Past Surgical History:  Procedure Laterality Date   ABDOMINAL AORTIC ANEURYSM REPAIR  2008   ABDOMINAL HYSTERECTOMY     AORTIC VALVE REPLACEMENT  2008   CARDIAC CATHETERIZATION     [place]   CARDIOVERSION N/A 08/15/2018   Procedure: CARDIOVERSION;  Surgeon: [name], MD;  Location: [facility] ORS;  Service: Cardiovascular;  Laterality: N/A;    CARDIOVERSION N/A 11/14/2018   Procedure: CARDIOVERSION (CATH LAB);  Surgeon: [name], MD;  Location: [facility] OR[facility];  Service: Cardiovascular;  Laterality: N/A;   CARDIOVERSION N/A 06/05/2019   Procedure: CARDIOVERSION;  Surgeon: [name], MD;  Location: [facility] OR[facility];  Service: Cardiovascular;  Laterality: N/A;   CARDIOVERSION N/A 08/14/2019   Procedure: CARDIOVERSION;  Surgeon: [name], MD;  Location: [facility] OR[facility];  Service: Cardiovascular;  Laterality: N/A;   CARDIOVERSION N/A 11/09/2019   Procedure: CARDIOVERSION;  Surgeon: Isaias Cowman, MD;  Location: [facility] OR[facility];  Service: Cardiovascular;  Laterality: N/A;   CARDIOVERSION N/A 01/17/2020   Procedure: CARDIOVERSION;  Surgeon: Isaias Cowman, MD;  Location: [facility];  Service: Cardiovascular;  Laterality: N/A;   CARPAL TUNNEL RELEASE     ELECTROPHYSIOLOGIC STUDY N/A 08/17/2016   Procedure: Cardioversion;  Surgeon: [name], MD;  Location: [facility];  Service: Cardiovascular;  Laterality: N/A;   TUMOR EXCISION Left    x3 (arm)   Active Ambulatory Problems    Diagnosis Date Noted   Atrial fibrillation with RVR ([place]) 01/11/2015   Essential hypertension 01/24/2015   Paroxysmal atrial fibrillation ([place]) 01/24/2015   History of aortic valve replacement with bioprosthetic valve 01/24/2015   Typical atrial flutter ([place])    Chronic obstructive pulmonary disease ([place]) 09/19/2017   Aneurysm, ascending aorta ([place]) 06/23/2013   Bicuspid aortic valve 06/23/2013   Cellulitis and abscess 02/13/2014   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 05/18/2014   Shoulder impingement syndrome (Left) 05/18/2014   Irritable bowel syndrome without diarrhea 01/29/2015   MRSA (methicillin resistant Staphylococcus aureus) 02/13/2014   Dysuria 03/02/2018   Urinary tract infection without hematuria 03/02/2018   Right upper quadrant abdominal tenderness without rebound tenderness 03/02/2018   Obstructive chronic  bronchitis without exacerbation ([place]) 03/02/2018   Positive colorectal cancer screening using Cologuard test 04/24/2018   Calculus of gallbladder with acute on chronic cholecystitis 04/08/2018   H/O: CVA (cerebrovascular accident) 04/08/2018   Encounter for long-term (current) use of medications 07/09/2018   Chronic left shoulder pain 07/09/2018   Conjunctivitis 07/09/2018   Oxygen dependent 07/09/2018   GAD (generalized anxiety disorder) 07/09/2018   Ovarian failure 07/09/2018   Paroxysmal atrial flutter ([place]) 08/11/2018   Atopic dermatitis 05/02/2019   Encounter for general adult medical examination with abnormal findings 07/10/2019   Encounter for screening  mammogram for malignant neoplasm of breast 07/10/2019   Persistent atrial fibrillation ([place])    Episode of moderate major depression ([place]) 10/10/2019   S/P placement of cardiac pacemaker 02/21/2020   Acute non-recurrent frontal sinusitis 04/22/2020   Vertigo 04/22/2020   Acute on chronic diastolic CHF (congestive heart failure) ([place]) 05/11/2020   COPD (chronic obstructive pulmonary disease) ([place])    Hypothyroidism    Symptomatic anemia 01/29/2021   Cardiac pacemaker in situ 05/10/2020   Chronic anticoagulation (Eliquis) 03/14/2021   Atrial fibrillation status post cardioversion ([place]) 11/16/2019   Hx of atrioventricular node ablation 03/14/2021   Chronic pain syndrome 04/29/2021   Pharmacologic therapy 04/29/2021   Disorder of skeletal system 04/29/2021   Problems influencing health status 04/29/2021   Long term prescription benzodiazepine use 04/30/2021   Chronic shoulder pain (1ry area of Pain) (Bilateral) (L>R) 04/30/2021   Chronic upper extremity pain (2ry area of Pain) (Bilateral) (L>R) 04/30/2021   Cervicalgia 04/30/2021   Chronic neck pain (3ry area of Pain) (Posterior) (Bilateral) (L>R) 04/30/2021   Shoulder blade pain (4th area of Pain) (Left) 04/30/2021   Osteoarthritis of glenohumeral joint (Left) 04/30/2021    Osteoarthritis of AC (acromioclavicular) joint (Left) 04/30/2021   Chronic headaches (5th area of Pain) 04/30/2021   Osteoarthritis of glenohumeral joints (Bilateral) 04/30/2021   Osteoarthritis of acromioclavicular joints (Bilateral) 04/30/2021   Primary osteoarthritis of shoulders (Bilateral) 04/30/2021   DDD (degenerative disc disease), cervical 04/30/2021   Cervical radiculitis (Left) 04/30/2021   Cervical radiculopathy (Left) 04/30/2021   C6 radiculopathy (Left) 04/30/2021   C7 radiculopathy (Left) 04/30/2021   Wheelchair bound 04/30/2021   Resolved Ambulatory Problems    Diagnosis Date Noted   No Resolved Ambulatory Problems   Past Medical History:  Diagnosis Date   Aortic stenosis due to bicuspid aortic valve    Atrial flutter ([name])    Basal skull fracture ([name]) 20 yrs ago   CHF (congestive heart failure) ([name])    Chronic respiratory failure ([name])    Deafness in left ear    History of cardiac cath    History of stress test    HLD (hyperlipidemia)    HTN (hypertension)    Obesity    PAF (paroxysmal atrial fibrillation) ([place])    S/P ascending aortic aneurysm repair 2008   Constitutional Exam  General appearance: Well nourished, well developed, and well hydrated. In no apparent acute distress Vitals:   04/30/21 1026  BP: 138/65  Pulse: 71  Resp: 16  Temp: (!) 96.6 F (35.9 C)  TempSrc: Temporal  SpO2: 97%  Weight: 186 lb (84.4 kg)  Height: 4' 10"  (1.473 m)   BMI Assessment: Estimated body mass index is 38.87 kg/m as calculated from the following:   Height as of this encounter: 4' 10"  (1.473 m).   Weight as of this encounter: 186 lb (84.4 kg).  BMI interpretation table: BMI level Category Range association with higher incidence of chronic pain  <18 kg/m2 Underweight   18.5-24.9 kg/m2 Ideal body weight   25-29.9 kg/m2 Overweight Increased incidence by 20%  30-34.9 kg/m2 Obese (Class I) Increased incidence by 68%  35-39.9 kg/m2 Severe obesity (Class II)  Increased incidence by 136%  >40 kg/m2 Extreme obesity (Class III) Increased incidence by 254%   Patient's current BMI Ideal Body weight  Body mass index is 38.87 kg/m. Patient must be at least 21 in tall to calculate ideal body weight   BMI Readings from Last 4 Encounters:  04/30/21 38.87 kg/m  04/29/21 39.00 kg/m  04/04/21 39.29 kg/m  04/01/21 38.62 kg/m   Wt Readings from Last 4 Encounters:  04/30/21 186 lb (84.4 kg)  04/29/21 186 lb 9.6 oz (84.6 kg)  04/04/21 188 lb (85.3 kg)  04/01/21 184 lb 12.8 oz (83.8 kg)    Psych/Mental status: Alert, oriented x 3 (person, place, & time)       Eyes: PERLA Respiratory: No evidence of acute respiratory distress  Assessment  Primary Diagnosis & Pertinent Problem List: The primary encounter diagnosis was Chronic shoulder pain (1ry area of Pain) (Bilateral) (L>R). Diagnoses of Chronic upper extremity pain (2ry area of Pain) (Bilateral) (L>R), Cervicalgia, Chronic neck pain (3ry area of Pain) (Posterior) (Bilateral) (L>R), Shoulder blade pain (4th area of Pain) (Left), Osteoarthritis of glenohumeral joint (Left), Osteoarthritis of AC (acromioclavicular) joint (Left), Shoulder impingement syndrome (Left), Chronic intractable headache, unspecified headache type, Osteoarthritis of glenohumeral joints (Bilateral), Osteoarthritis of acromioclavicular joints (Bilateral), Primary osteoarthritis of shoulders (Bilateral), DDD (degenerative disc disease), cervical, Cervical radiculitis (Left), Cervical radiculopathy (Left), C6 radiculopathy (Left), C7 radiculopathy (Left), Chronic pain syndrome, Pharmacologic therapy, Long term prescription benzodiazepine use, Disorder of skeletal system, Problems influencing health status, Cardiac pacemaker in situ, Chronic anticoagulation (Eliquis), Wheelchair bound, Oxygen dependent, Chronic obstructive pulmonary disease, unspecified COPD type ([place]), and History of aortic valve replacement with bioprosthetic valve were  also pertinent to this visit.  Visit Diagnosis (New problems to examiner): 1. Chronic shoulder pain (1ry area of Pain) (Bilateral) (L>R)   2. Chronic upper extremity pain (2ry area of Pain) (Bilateral) (L>R)   3. Cervicalgia   4. Chronic neck pain (3ry area of Pain) (Posterior) (Bilateral) (L>R)   5. Shoulder blade pain (4th area of Pain) (Left)   6. Osteoarthritis of glenohumeral joint (Left)   7. Osteoarthritis of AC (acromioclavicular) joint (Left)   8. Shoulder impingement syndrome (Left)   9. Chronic intractable headache, unspecified headache type   10. Osteoarthritis of glenohumeral joints (Bilateral)   11. Osteoarthritis of acromioclavicular joints (Bilateral)   12. Primary osteoarthritis of shoulders (Bilateral)   13. DDD (degenerative disc disease), cervical   14. Cervical radiculitis (Left)   15. Cervical radiculopathy (Left)   16. C6 radiculopathy (Left)   17. C7 radiculopathy (Left)   18. Chronic pain syndrome   19. Pharmacologic therapy   20. Long term prescription benzodiazepine use   21. Disorder of skeletal system   22. Problems influencing health status   23. Cardiac pacemaker in situ   24. Chronic anticoagulation (Eliquis)   25. Wheelchair bound   26. Oxygen dependent   27. Chronic obstructive pulmonary disease, unspecified COPD type ([place])   28. History of aortic valve replacement with bioprosthetic valve    Plan of Care (Initial workup plan)  Note: Ms. Cleek was reminded that as per protocol, today's visit has been an evaluation only. We have not taken over the patient's controlled substance management.  Problem-specific plan: No problem-specific Assessment & Plan notes found for this encounter. Lab Orders         Compliance Drug Analysis, Ur         Comp. Metabolic Panel (12)         Magnesium         Vitamin B12         Sedimentation rate         25-Hydroxy vitamin D Lcms D2+D3         C-reactive protein     Imaging Orders         CT SHOULDER LEFT  WO CONTRAST         DG Cervical Spine With Flex & Extend         CT CERVICAL SPINE WO CONTRAST     Referral Orders  No referral(s) requested today   Procedure Orders    No procedure(s) ordered today   Pharmacotherapy (current): Medications ordered:  No orders of the defined types were placed in this encounter.  Medications administered during this visit: Ruta E. Khaimov had no medications administered during this visit.   Pharmacological management options:  Opioid Analgesics: The patient was informed that there is no guarantee that she would be a candidate for opioid analgesics. The decision will be made following CDC guidelines. This decision will be based on the results of diagnostic studies, as well as Ms. Holaday's risk profile.   Membrane stabilizer: To be determined at a later time  Muscle relaxant: To be determined at a later time  NSAID: To be determined at a later time  Other analgesic(s): To be determined at a later time   Interventional management options: Ms. Fleer was informed that there is no guarantee that she would be a candidate for interventional therapies. The decision will be based on the results of diagnostic studies, as well as Ms. Hilyard's risk profile.  Procedure(s) under consideration:  Pending results of ordered studies     Interventional Therapies  Risk  Complexity Considerations:   Estimated body mass index is 38.87 kg/m as calculated from the following:   Height as of this encounter: 4' 10"  (1.473 m).   Weight as of this encounter: 186 lb (84.4 kg). Eliquis Anticoagulation: (Stop: 3 days  Restart: 6 hours)   Planned  Pending:   Pending further evaluation   Under consideration:   None at this point   Completed:   None at this time   Therapeutic  Palliative (PRN) options:   None established    Provider-requested follow-up: Return for (47mn), Eval-day(M,W), (F2F), 2nd Visit, for review of ordered tests.  Future Appointments  Date Time  [place] 05/13/2021  2:15 PM KAllyne Gee MD [facility] None  05/22/2021  1:30 PM HAlisa Graff FNP [facility] None  06/06/2021  2:15 PM [facility] None  06/06/2021  2:45 PM BCammie Sickle MD [facility] None  06/18/2021  1:20 PM NMilinda Pointer MD [facility] None  07/15/2021  2:00 PM AJonetta Osgood NP [facility] None    Note by: FGaspar Cola MD Date: 04/30/2021; Time: 1:06 PM

## 2021-04-30 ENCOUNTER — Encounter: Payer: Self-pay | Admitting: Pain Medicine

## 2021-04-30 ENCOUNTER — Telehealth: Payer: Self-pay

## 2021-04-30 ENCOUNTER — Ambulatory Visit: Payer: Medicare Other | Attending: Pain Medicine | Admitting: Pain Medicine

## 2021-04-30 ENCOUNTER — Ambulatory Visit: Payer: Medicare Other

## 2021-04-30 VITALS — BP 138/65 | HR 71 | Temp 96.6°F | Resp 16 | Ht <= 58 in | Wt 186.0 lb

## 2021-04-30 DIAGNOSIS — J449 Chronic obstructive pulmonary disease, unspecified: Secondary | ICD-10-CM

## 2021-04-30 DIAGNOSIS — G8929 Other chronic pain: Secondary | ICD-10-CM

## 2021-04-30 DIAGNOSIS — R519 Headache, unspecified: Secondary | ICD-10-CM | POA: Diagnosis present

## 2021-04-30 DIAGNOSIS — M898X1 Other specified disorders of bone, shoulder: Secondary | ICD-10-CM | POA: Diagnosis not present

## 2021-04-30 DIAGNOSIS — M7542 Impingement syndrome of left shoulder: Secondary | ICD-10-CM

## 2021-04-30 DIAGNOSIS — M5412 Radiculopathy, cervical region: Secondary | ICD-10-CM

## 2021-04-30 DIAGNOSIS — G894 Chronic pain syndrome: Secondary | ICD-10-CM | POA: Diagnosis not present

## 2021-04-30 DIAGNOSIS — M19012 Primary osteoarthritis, left shoulder: Secondary | ICD-10-CM | POA: Diagnosis not present

## 2021-04-30 DIAGNOSIS — M79601 Pain in right arm: Secondary | ICD-10-CM | POA: Diagnosis not present

## 2021-04-30 DIAGNOSIS — M25511 Pain in right shoulder: Secondary | ICD-10-CM | POA: Diagnosis not present

## 2021-04-30 DIAGNOSIS — M19011 Primary osteoarthritis, right shoulder: Secondary | ICD-10-CM | POA: Diagnosis not present

## 2021-04-30 DIAGNOSIS — M542 Cervicalgia: Secondary | ICD-10-CM

## 2021-04-30 DIAGNOSIS — Z79899 Other long term (current) drug therapy: Secondary | ICD-10-CM

## 2021-04-30 DIAGNOSIS — Z950 Presence of cardiac pacemaker: Secondary | ICD-10-CM

## 2021-04-30 DIAGNOSIS — M25512 Pain in left shoulder: Secondary | ICD-10-CM

## 2021-04-30 DIAGNOSIS — M79602 Pain in left arm: Secondary | ICD-10-CM

## 2021-04-30 DIAGNOSIS — M5030 Other cervical disc degeneration, unspecified cervical region: Secondary | ICD-10-CM | POA: Diagnosis not present

## 2021-04-30 DIAGNOSIS — Z7901 Long term (current) use of anticoagulants: Secondary | ICD-10-CM

## 2021-04-30 DIAGNOSIS — Z9981 Dependence on supplemental oxygen: Secondary | ICD-10-CM

## 2021-04-30 DIAGNOSIS — Z993 Dependence on wheelchair: Secondary | ICD-10-CM | POA: Diagnosis present

## 2021-04-30 DIAGNOSIS — M899 Disorder of bone, unspecified: Secondary | ICD-10-CM

## 2021-04-30 DIAGNOSIS — Z953 Presence of xenogenic heart valve: Secondary | ICD-10-CM | POA: Diagnosis present

## 2021-04-30 DIAGNOSIS — Z789 Other specified health status: Secondary | ICD-10-CM

## 2021-04-30 NOTE — Progress Notes (Signed)
Safety precautions to be maintained throughout the outpatient stay will include: orient to surroundings, keep bed in low position, maintain call bell within reach at all times, provide assistance with transfer out of bed and ambulation.  

## 2021-04-30 NOTE — Telephone Encounter (Signed)
Dr Dossie Arbour has seen this patient today would like permission to stop Eliquis for 3 days for a procedure.  Please let us know as soon as possible if this is approved from a cardiac standpoint.  Thank you!

## 2021-04-30 NOTE — Patient Instructions (Signed)
Instructed to get xray done in the medical mall.  Clearance sent to Dr Saralyn Pilar to stop Eliquis for 3 days via [employer].

## 2021-05-02 ENCOUNTER — Telehealth: Payer: Self-pay | Admitting: Internal Medicine

## 2021-05-02 DIAGNOSIS — J449 Chronic obstructive pulmonary disease, unspecified: Secondary | ICD-10-CM | POA: Diagnosis not present

## 2021-05-02 NOTE — Chronic Care Management (AMB) (Signed)
  Chronic Care Management   Note  05/02/2021 Name: [name] MRN: [id number] DOB: Jan 13, 1946  [name] is a 75 y.o. year old female who is a primary care patient of [name], MD. I reached out to [name] by phone today in response to a referral sent by [name]'s PCP, [name], MD.   [name] was given information about Chronic Care Management services today including:  CCM service includes personalized support from designated clinical staff supervised by her physician, including individualized plan of care and coordination with other care providers 24/7 contact phone numbers for assistance for urgent and routine care needs. Service will only be billed when office clinical staff spend 20 minutes or more in a month to coordinate care. Only one practitioner may furnish and bill the service in a calendar month. The patient may stop CCM services at any time (effective at the end of the month) by phone call to the office staff.   Patient agreed to services and verbal consent obtained.   Follow up plan:   [place]ana Secretary/administrator

## 2021-05-04 LAB — COMPLIANCE DRUG ANALYSIS, UR

## 2021-05-05 ENCOUNTER — Telehealth: Payer: Self-pay

## 2021-05-05 ENCOUNTER — Other Ambulatory Visit: Payer: Self-pay | Admitting: Internal Medicine

## 2021-05-05 DIAGNOSIS — G8929 Other chronic pain: Secondary | ICD-10-CM

## 2021-05-05 DIAGNOSIS — M25511 Pain in right shoulder: Secondary | ICD-10-CM

## 2021-05-05 LAB — C-REACTIVE PROTEIN: CRP: 6 mg/L (ref 0–10)

## 2021-05-05 LAB — 25-HYDROXY VITAMIN D LCMS D2+D3
25-Hydroxy, Vitamin D-2: <1 ng/mL
25-Hydroxy, Vitamin D-3: 31 ng/mL
25-Hydroxy, Vitamin D: 31 ng/mL

## 2021-05-05 LAB — VITAMIN B12: Vitamin B-12: >2000 pg/mL — ABNORMAL HIGH (ref 232–1245)

## 2021-05-05 LAB — COMP. METABOLIC PANEL (12)
AST: 18 IU/L (ref 0–40)
Albumin/Globulin Ratio: 1.8 (ref 1.2–2.2)
Albumin: 4.1 g/dL (ref 3.7–4.7)
Alkaline Phosphatase: 98 IU/L (ref 44–121)
BUN/Creatinine Ratio: 16 (ref 12–28)
BUN: 13 mg/dL (ref 8–27)
Bilirubin Total: <0.2 mg/dL (ref 0.0–1.2)
Calcium: 9 mg/dL (ref 8.7–10.3)
Chloride: 98 mmol/L (ref 96–106)
Creatinine, Ser: 0.8 mg/dL (ref 0.57–1.00)
Globulin, Total: 2.3 g/dL (ref 1.5–4.5)
Glucose: 92 mg/dL (ref 65–99)
Potassium: 3.9 mmol/L (ref 3.5–5.2)
Sodium: 139 mmol/L (ref 134–144)
Total Protein: 6.4 g/dL (ref 6.0–8.5)
eGFR: 78 mL/min/{1.73_m2} (ref >59)

## 2021-05-05 LAB — SEDIMENTATION RATE: Sed Rate: 40 mm/hr (ref 0–40)

## 2021-05-05 LAB — MAGNESIUM: Magnesium: 2.1 mg/dL (ref 1.6–2.3)

## 2021-05-05 MED ORDER — OXYCODONE-ACETAMINOPHEN 7.5-325 MG PO TABS: 0.5000 | ORAL_TABLET | Freq: Every day | ORAL | 0 refills | Status: DC | PRN

## 2021-05-05 NOTE — Telephone Encounter (Signed)
Pt advised to let us know at least 3-5 days ahead before running out of her pain meds.  Also we will change her prescription to take 1.5 tablets per day.  Pt has been taking 1 tablet in the morning and 1/2 tablet at night of the oxycodone-acetaminophen 7.5/325 mg

## 2021-05-05 NOTE — Telephone Encounter (Signed)
error 

## 2021-05-06 ENCOUNTER — Other Ambulatory Visit: Payer: Self-pay | Admitting: Internal Medicine

## 2021-05-06 DIAGNOSIS — M25511 Pain in right shoulder: Secondary | ICD-10-CM

## 2021-05-06 DIAGNOSIS — G8929 Other chronic pain: Secondary | ICD-10-CM

## 2021-05-06 MED ORDER — OXYCODONE-ACETAMINOPHEN 7.5-325 MG PO TABS: ORAL_TABLET | ORAL | 0 refills | Status: DC

## 2021-05-09 DIAGNOSIS — M19011 Primary osteoarthritis, right shoulder: Secondary | ICD-10-CM | POA: Diagnosis not present

## 2021-05-09 DIAGNOSIS — M19012 Primary osteoarthritis, left shoulder: Secondary | ICD-10-CM | POA: Diagnosis not present

## 2021-05-13 ENCOUNTER — Encounter: Payer: Self-pay | Admitting: Internal Medicine

## 2021-05-13 ENCOUNTER — Ambulatory Visit: Payer: Medicare Other | Admitting: Internal Medicine

## 2021-05-13 ENCOUNTER — Other Ambulatory Visit: Payer: Self-pay

## 2021-05-13 ENCOUNTER — Ambulatory Visit: Payer: Medicare Other | Admitting: Gastroenterology

## 2021-05-13 VITALS — BP 122/80 | HR 72 | Temp 97.8°F | Resp 16 | Ht <= 58 in | Wt 182.0 lb

## 2021-05-13 DIAGNOSIS — J449 Chronic obstructive pulmonary disease, unspecified: Secondary | ICD-10-CM | POA: Diagnosis not present

## 2021-05-13 DIAGNOSIS — Z9981 Dependence on supplemental oxygen: Secondary | ICD-10-CM

## 2021-05-13 DIAGNOSIS — I480 Paroxysmal atrial fibrillation: Secondary | ICD-10-CM | POA: Diagnosis not present

## 2021-05-13 DIAGNOSIS — G4733 Obstructive sleep apnea (adult) (pediatric): Secondary | ICD-10-CM

## 2021-05-13 NOTE — Patient Instructions (Signed)
Sleep Apnea Sleep apnea affects breathing during sleep. It causes breathing to stop for 10 seconds or more, or to become shallow. People with sleep apnea usually snoreloudly. It can also increase the risk of: Heart attack. Stroke. Being very overweight (obese). Diabetes. Heart failure. Irregular heartbeat. High blood pressure. The goal of treatment is to help you breathe normally again. What are the causes?  The most common cause of this condition is a collapsed or blocked airway. There are three kinds of sleep apnea: Obstructive sleep apnea. This is caused by a blocked or collapsed airway. Central sleep apnea. This happens when the brain does not send the right signals to the muscles that control breathing. Mixed sleep apnea. This is a combination of obstructive and central sleep apnea. What increases the risk? Being overweight. Smoking. Having a small airway. Being older. Being female. Drinking alcohol. Taking medicines to calm yourself (sedatives or tranquilizers). Having family members with the condition. Having a tongue or tonsils that are larger than normal. What are the signs or symptoms? Trouble staying asleep. Loud snoring. Headaches in the morning. Waking up gasping. Dry mouth or sore throat in the morning. Being sleepy or tired during the day. If you are sleepy or tired during the day, you may also: Not be able to focus your mind (concentrate). Forget things. Get angry a lot and have mood swings. Feel sad (depressed). Have changes in your personality. Have less interest in sex, if you are female. Be unable to have an erection, if you are female. How is this treated?  Sleeping on your side. Using a medicine to get rid of mucus in your nose (decongestant). Avoiding the use of alcohol, medicines to help you relax, or certain pain medicines (narcotics). Losing weight, if needed. Changing your diet. Quitting smoking. Using a machine to open your airway while you  sleep, such as: An oral appliance. This is a mouthpiece that shifts your lower jaw forward. A CPAP device. This device blows air through a mask when you breathe out (exhale). An EPAP device. This has valves that you put in each nostril. A BPAP device. This device blows air through a mask when you breathe in (inhale) and breathe out. Having surgery if other treatments do not work. Follow these instructions at home: Lifestyle Make changes that your doctor recommends. Eat a healthy diet. Lose weight if needed. Avoid alcohol, medicines to help you relax, and some pain medicines. Do not smoke or use any products that contain nicotine or tobacco. If you need help quitting, ask your doctor. General instructions Take over-the-counter and prescription medicines only as told by your doctor. If you were given a machine to use while you sleep, use it only as told by your doctor. If you are having surgery, make sure to tell your doctor you have sleep apnea. You may need to bring your device with you. Keep all follow-up visits. Contact a doctor if: The machine that you were given to use during sleep bothers you or does not seem to be working. You do not get better. You get worse. Get help right away if: Your chest hurts. You have trouble breathing in enough air. You have an uncomfortable feeling in your back, arms, or stomach. You have trouble talking. One side of your body feels weak. A part of your face is hanging down. These symptoms may be an emergency. Get help right away. Call your local emergency services (911 in the [place] Do not wait to see if the symptoms   will go away. Do not drive yourself to the hospital. Summary This condition affects breathing during sleep. The most common cause is a collapsed or blocked airway. The goal of treatment is to help you breathe normally while you sleep. This information is not intended to replace advice given to you by your health care provider. Make  sure you discuss any questions you have with your healthcare provider. Document Revised: 08/02/2020 Document Reviewed: 08/02/2020 Elsevier Patient Education  2022 [employer]  

## 2021-05-13 NOTE — Progress Notes (Signed)
[facility] [place], [place] [address]  Pulmonary Sleep Medicine   Office Visit Note  Patient Name: [name] DOB: 15-Jun-1946 MRN [id number]  Date of Service: 05/13/2021  Complaints/HPI: Atrial Fib CHF and COPD OSA with obesity. Patient has still got SOB and tries to pace herself with her walking. She does not have any chest pain noted. She does have oxygen dependence and continues to use portable oxygen.  As far as her sleep apnea is concerned she states that it is controlled on the CPAP therapy.  She denies having any complications related to the CPAP.  Patient supposed to also have an echocardiogram done this week for congestive heart failure.  The atrial fibrillation has been under control also the rate has been controlled without any persistence of rapid ventricular response noted.  COPD moderate disease patient still has shortness of breath and as already mentioned she continues to use oxygen.  ROS  General: (-) fever, (-) chills, (-) night sweats, (-) weakness Skin: (-) rashes, (-) itching,. Eyes: (-) visual changes, (-) redness, (-) itching. Nose and Sinuses: (-) nasal stuffiness or itchiness, (-) postnasal drip, (-) nosebleeds, (-) sinus trouble. Mouth and Throat: (-) sore throat, (-) hoarseness. Neck: (-) swollen glands, (-) enlarged thyroid, (-) neck pain. Respiratory: + cough, (-) bloody sputum, + shortness of breath, + wheezing. Cardiovascular: - ankle swelling, (-) chest pain. Lymphatic: (-) lymph node enlargement. Neurologic: (-) numbness, (-) tingling. Psychiatric: (-) anxiety, (-) depression   Current Medication: Outpatient Encounter Medications as of 05/13/2021  Medication Sig Note   ALPRAZolam (XANAX) 0.25 MG tablet Take 1 tablet (0.25 mg total) by mouth 2 (two) times daily as needed for anxiety.    atorvastatin (LIPITOR) 40 MG tablet 1 tab po qhs    bumetanide (BUMEX) 1 MG tablet Take 2 tablets (2 mg total) by mouth 2 (two) times  daily.    Calcium Carbonate-Vitamin D (CALCIUM 600+D PO) Take 1 tablet by mouth daily.    Coenzyme Q10 (COQ10) 200 MG CAPS Take 200 mg by mouth daily.    diclofenac Sodium (VOLTAREN) 1 % GEL SMARTSIG:Gram(s) Topical Twice Daily    ELIQUIS 5 MG TABS tablet Take 1 tablet by mouth twice daily. (Patient taking differently: Take 5 mg by mouth 2 (two) times daily.)    ferrous sulfate 325 (65 FE) MG tablet Take 2 tablets (650 mg total) by mouth 2 (two) times daily with a meal. (Patient taking differently: Take 325 mg by mouth. Takes 4 tablets by mouth twice a day)    flecainide (TAMBOCOR) 50 MG tablet Take 1 tablet (50 mg total) by mouth 2 (two) times daily. PLEASE CONTACT OFFICE [phone] TO SCHEDULE APPOINTMENT FOR FUTURE REFILLS    levothyroxine (SYNTHROID) 25 MCG tablet Take 1 tablet (25 mcg total) by mouth daily before breakfast.    lisinopril (ZESTRIL) 40 MG tablet Take 1 tablet (40 mg total) by mouth daily. 01/29/2021: Pt had today at 1100   loratadine (CLARITIN) 10 MG tablet Take 10 mg by mouth daily.    oxyCODONE-acetaminophen (PERCOCET) 7.5-325 MG tablet Take one tab in am and half in pm for severe shoulder pain    pantoprazole (PROTONIX) 40 MG tablet Take 1 tablet (40 mg total) by mouth daily.    potassium chloride (KLOR-CON) 10 MEQ tablet Take 4 tablets (40 mEq total) by mouth 2 (two) times daily.    sertraline (ZOLOFT) 50 MG tablet Take 1 tablet (50 mg total) by mouth daily.  traZODone (DESYREL) 50 MG tablet Take 1 tablet (50 mg total) by mouth at bedtime.    TRELEGY ELLIPTA 100-62.5-25 MCG/INH AEPB Inhale 1 puff by mouth into the lungs daily.    VENTOLIN HFA 108 (90 Base) MCG/ACT inhaler INHALE 2 PUFFS INTO THE LUNGS EVERY 6 HOURS AS NEEDED FOR WHEEZING OR SHORTNESS OF BREATH    No facility-administered encounter medications on file as of 05/13/2021.    Surgical History: Past Surgical History:  Procedure Laterality Date   ABDOMINAL AORTIC ANEURYSM REPAIR  2008   ABDOMINAL  HYSTERECTOMY     AORTIC VALVE REPLACEMENT  2008   CARDIAC CATHETERIZATION     [place]   CARDIOVERSION N/A 08/15/2018   Procedure: CARDIOVERSION;  Surgeon: [name], MD;  Location: [facility] ORS;  Service: Cardiovascular;  Laterality: N/A;   CARDIOVERSION N/A 11/14/2018   Procedure: CARDIOVERSION (CATH LAB);  Surgeon: [name], MD;  Location: [facility];  Service: Cardiovascular;  Laterality: N/A;   CARDIOVERSION N/A 06/05/2019   Procedure: CARDIOVERSION;  Surgeon: [name], MD;  Location: [facility];  Service: Cardiovascular;  Laterality: N/A;   CARDIOVERSION N/A 08/14/2019   Procedure: CARDIOVERSION;  Surgeon: [name], MD;  Location: [facility];  Service: Cardiovascular;  Laterality: N/A;   CARDIOVERSION N/A 11/09/2019   Procedure: CARDIOVERSION;  Surgeon: Isaias Cowman, MD;  Location: [facility];  Service: Cardiovascular;  Laterality: N/A;   CARDIOVERSION N/A 01/17/2020   Procedure: CARDIOVERSION;  Surgeon: Isaias Cowman, MD;  Location: [facility];  Service: Cardiovascular;  Laterality: N/A;   CARPAL TUNNEL RELEASE     ELECTROPHYSIOLOGIC STUDY N/A 08/17/2016   Procedure: Cardioversion;  Surgeon: [name], MD;  Location: [facility];  Service: Cardiovascular;  Laterality: N/A;   TUMOR EXCISION Left    x3 (arm)    Medical History: Past Medical History:  Diagnosis Date   Aortic 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 s/p bioprosthetic valve replacement 2008 at [facility];  b. 01/2015 Echo: EF 60-65%, no rwma, Gr1 DD, mildly dil LA, nl RV fxn.   Atrial fibrillation with RVR ([place]) 01/11/2015   Atrial flutter ([place])    a. 08/2016 s/p DCCV.  Remains on flecainide 50 mg bid.   Basal skull fracture ([name]) 20 yrs ago   CHF (congestive heart failure) ([name])    Chronic respiratory failure ([name])    COPD (chronic obstructive pulmonary disease) ([place])    a. 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 2008 prior to Aortic aneurysm repair-->nl cors.   History of stress test    a. 10/2015 MV: no ischemia/infarct.   HLD (hyperlipidemia)    HTN (hypertension)    Hypothyroidism    Obesity    PAF (paroxysmal atrial fibrillation) ([name])    a. on Eliquis; b. CHADS2VASc = 3 (HTN, age x 66, female).   Paroxysmal atrial fibrillation ([place]) 01/24/2015   Right upper quadrant abdominal tenderness without rebound tenderness 03/02/2018   S/P ascending aortic aneurysm repair 2008    Family History: Family History  Problem Relation Age of Onset   Stroke Father    Stroke Paternal Grandmother     Social History: Social History   Socioeconomic History   Marital status: Single    Spouse name: Not on file   Number of children: Not on file  Years of education: Not on file   Highest education level: Not on file  Occupational History   Not on file  Tobacco Use   Smoking status: Former    Types: Cigarettes   Smokeless tobacco: Never  Vaping Use   Vaping Use: Never used  Substance and Sexual Activity   Alcohol use: No   Drug use: No   Sexual activity: Never    Birth control/protection: Surgical  Other Topics Concern   Not on file  Social History Narrative   Not on file   Social Determinants of Health   Financial Resource Strain: Not on file  Food Insecurity: Not on file  Transportation Needs: Not on file  Physical Activity: Not on file  Stress: Not on file  Social Connections: Not on file  Intimate Partner Violence: Not on file    Vital Signs: Blood pressure 122/80, pulse 72, temperature 97.8 F (36.6 C), resp. rate 16, height 4' 10"  (1.473 m), weight 182 lb (82.6 kg), SpO2 98 %.  Examination: General Appearance: The patient is well-developed, well-nourished, and in no distress. Skin: Gross inspection of skin unremarkable. Head: normocephalic, no gross deformities. Eyes: no gross deformities noted. ENT: ears appear grossly normal no  exudates. Neck: Supple. No thyromegaly. No LAD. Respiratory: no rhonchi noted. Cardiovascular: Normal S1 and S2 without murmur or rub. Extremities: No cyanosis. pulses are equal. Neurologic: Alert and oriented. No involuntary movements.  LABS: Recent Results (from the past 2160 hour(s))  Basic Metabolic Panel (BMET)     Status: None   Collection Time: 03/11/21 11:14 AM  Result Value Ref Range   Glucose 93 65 - 99 mg/dL   BUN 13 8 - 27 mg/dL   Creatinine, Ser 0.90 0.57 - 1.00 mg/dL   eGFR 68 >59 mL/min/1.73   BUN/Creatinine Ratio 14 12 - 28   Sodium 138 134 - 144 mmol/L   Potassium 4.0 3.5 - 5.2 mmol/L   Chloride 99 96 - 106 mmol/L   CO2 25 20 - 29 mmol/L   Calcium 9.0 8.7 - 10.3 mg/dL  CBC with Differential/Platelet     Status: Abnormal   Collection Time: 03/11/21 11:14 AM  Result Value Ref Range   WBC 7.3 3.4 - 10.8 x10E3/uL   RBC 2.99 (L) 3.77 - 5.28 x10E6/uL   Hemoglobin 8.8 (L) 11.1 - 15.9 g/dL   Hematocrit 28.2 (L) 34.0 - 46.6 %   MCV 94 79 - 97 fL   MCH 29.4 26.6 - 33.0 pg   MCHC 31.2 (L) 31.5 - 35.7 g/dL   RDW 16.6 (H) 11.7 - 15.4 %   Platelets 312 150 - 450 x10E3/uL   Neutrophils 72 Not Estab. %   Lymphs 13 Not Estab. %   Monocytes 13 Not Estab. %   Eos 2 Not Estab. %   Basos 0 Not Estab. %   Neutrophils Absolute 5.3 1.4 - 7.0 x10E3/uL   Lymphocytes Absolute 0.9 0.7 - 3.1 x10E3/uL   Monocytes Absolute 0.9 0.1 - 0.9 x10E3/uL   EOS (ABSOLUTE) 0.1 0.0 - 0.4 x10E3/uL   Basophils Absolute 0.0 0.0 - 0.2 x10E3/uL   Immature Granulocytes 0 Not Estab. %   Immature Grans (Abs) 0.0 0.0 - 0.1 x10E3/uL  B12 and Folate Panel     Status: None   Collection Time: 03/11/21 11:14 AM  Result Value Ref Range   Vitamin B-12 887 232 - 1,245 pg/mL   Folate >20.0 >3.0 ng/mL    Comment: A serum folate concentration of less than  3.1 ng/mL is considered to represent clinical deficiency.   Fe+TIBC+Fer     Status: Abnormal   Collection Time: 03/11/21 11:14 AM  Result Value Ref Range    Total Iron Binding Capacity 405 250 - 450 ug/dL   UIBC 248 118 - 369 ug/dL   Iron 157 (H) 27 - 139 ug/dL   Iron Saturation 39 15 - 55 %   Ferritin 70 15 - 150 ng/mL  Hgb Fractionation Cascade     Status: None   Collection Time: 03/14/21  3:06 PM  Result Value Ref Range   Hgb F 0.0 0.0 - 2.0 %   Hgb A 98.0 96.4 - 98.8 %   Hgb A2 2.0 1.8 - 3.2 %   Hgb S 0.0 0.0 %   Interpretation, Hgb Fract Comment     Comment: (NOTE) Normal hemoglobin present; no hemoglobin variant or beta thalassemia identified. Note: Alpha thalassemia may not be detected by the Hgb Fractionation Cascade panel. If alpha thalassemia is suspected, [facility] offers Alpha-Thalassemia DNA Analysis [phone]). Performed At: [facility] [place], [place] [id number] Rush Farmer MD [id number]   TSH     Status: None   Collection Time: 03/14/21  3:32 PM  Result Value Ref Range   TSH 2.406 0.350 - 4.500 uIU/mL    Comment: Performed by a 3rd Generation assay with a functional sensitivity of <=0.01 uIU/mL. Performed at [facility], [address] [place], [place] [address]   Comprehensive metabolic panel     Status: Abnormal   Collection Time: 03/14/21  3:32 PM  Result Value Ref Range   Sodium 133 (L) 135 - 145 mmol/L   Potassium 3.7 3.5 - 5.1 mmol/L   Chloride 96 (L) 98 - 111 mmol/L   CO2 28 22 - 32 mmol/L   Glucose, Bld 93 70 - 99 mg/dL    Comment: Glucose reference range applies only to samples taken after fasting for at least 8 hours.   BUN 17 8 - 23 mg/dL   Creatinine, Ser 0.81 0.44 - 1.00 mg/dL   Calcium 9.0 8.9 - 10.3 mg/dL   Total Protein 6.9 6.5 - 8.1 g/dL   Albumin 3.7 3.5 - 5.0 g/dL   AST 17 15 - 41 U/L   ALT 13 0 - 44 U/L   Alkaline Phosphatase 66 38 - 126 U/L   Total Bilirubin 0.5 0.3 - 1.2 mg/dL   GFR, Estimated >60 >60 mL/min    Comment: (NOTE) Calculated using the CKD-EPI Creatinine Equation (2021)    Anion gap 9 5 - 15    Comment: Performed at [facility], [address] [place], [place] [address]  CBC with Differential/Platelet     Status: Abnormal   Collection Time: 03/14/21  3:32 PM  Result Value Ref Range   WBC 6.5 4.0 - 10.5 K/uL   RBC 2.84 (L) 3.87 - 5.11 MIL/uL   Hemoglobin 8.4 (L) 12.0 - 15.0 g/dL   HCT 28.7 (L) 36.0 - 46.0 %   MCV 101.1 (H) 80.0 - 100.0 fL   MCH 29.6 26.0 - 34.0 pg   MCHC 29.3 (L) 30.0 - 36.0 g/dL   RDW 18.3 (H) 11.5 - 15.5 %   Platelets 292 150 - 400 K/uL   nRBC 0.0 0.0 - 0.2 %   Neutrophils Relative % 72 %   Neutro Abs 4.7 1.7 - 7.7 K/uL   Lymphocytes Relative 14 %   Lymphs Abs 0.9 0.7 - 4.0 K/uL  Monocytes Relative 11 %   Monocytes Absolute 0.7 0.1 - 1.0 K/uL   Eosinophils Relative 1 %   Eosinophils Absolute 0.1 0.0 - 0.5 K/uL   Basophils Relative 1 %   Basophils Absolute 0.0 0.0 - 0.1 K/uL   Immature Granulocytes 1 %   Abs Immature Granulocytes 0.03 0.00 - 0.07 K/uL    Comment: Performed at [facility], [address] [place], [place] [address]  Protein electrophoresis, serum     Status: None   Collection Time: 03/14/21  3:32 PM  Result Value Ref Range   Total Protein ELP 6.1 6.0 - 8.5 g/dL   Albumin ELP 3.6 2.9 - 4.4 g/dL   Alpha-1-Globulin 0.3 0.0 - 0.4 g/dL   Alpha-2-Globulin 0.6 0.4 - 1.0 g/dL   Beta Globulin 1.0 0.7 - 1.3 g/dL   Gamma Globulin 0.6 0.4 - 1.8 g/dL   M-Spike, % Not Observed Not Observed g/dL   SPE Interp. Comment     Comment: (NOTE) The SPE pattern appears unremarkable. Evidence of monoclonal protein is not apparent. Performed At: [facility] [place], [place] [id number] Rush Farmer MD UU:[id number]    Comment Comment     Comment: (NOTE) Protein electrophoresis scan will follow via computer, mail, or courier delivery.    Globulin, Total 2.5 2.2 - 3.9 g/dL   A/G Ratio 1.4 0.7 - 1.7  ANA w/Reflex     Status: None   Collection Time: 03/14/21  3:32 PM  Result Value Ref Range   Anti Nuclear Antibody (ANA) Negative  Negative    Comment: (NOTE) Performed At: [facility] [place], [place] [id number] Rush Farmer MD YI:[id number]   DAT, polyspecific, AHG [facility])     Status: None   Collection Time: 03/14/21  3:32 PM  Result Value Ref Range   Polyspecific AHG test      NEG Performed at [facility], [address] [place], [place] [address]   Folic Acid     Status: None   Collection Time: 03/14/21  3:32 PM  Result Value Ref Range   Folate 60.0 >5.9 ng/mL    Comment: RESULT CONFIRMED BY MANUAL DILUTION DLB Performed at [facility], [address] [place], [place] [address]   Haptoglobin     Status: None   Collection Time: 03/14/21  3:32 PM  Result Value Ref Range   Haptoglobin 117 42 - 346 mg/dL    Comment: (NOTE) Performed At: [facility] [place], [place] [id number] Rush Farmer MD [id number]   Vitamin B12     Status: None   Collection Time: 03/14/21  3:32 PM  Result Value Ref Range   Vitamin B-12 631 180 - 914 pg/mL    Comment: (NOTE) This assay is not validated for testing neonatal or myeloproliferative syndrome specimens for Vitamin B12 levels. Performed at [facility], [place] [address] [place], [place] [address]   Erythropoietin     Status: Abnormal   Collection Time: 03/14/21  3:32 PM  Result Value Ref Range   Erythropoietin 128.7 (H) 2.6 - 18.5 mIU/mL    Comment: (NOTE) [employer] UniCel DxI 800 Immunoassay System Values obtained with different assay methods or kits cannot be used interchangeably. Results cannot be interpreted as absolute evidence of the presence or absence of malignant disease. Performed At: [facility] [place], [place] [id number] Rush Farmer MD [id number]   Iron and TIBC     Status: Abnormal  Collection Time: 03/14/21  3:32 PM  Result Value Ref Range   Iron 156 28 - 170 ug/dL   TIBC 463 (H) 250 - 450 ug/dL   Saturation Ratios 34 (H)  10.4 - 31.8 %   UIBC 307 ug/dL    Comment: Performed at [facility], [address] [place], [place] [address]  Ferritin     Status: None   Collection Time: 03/14/21  3:32 PM  Result Value Ref Range   Ferritin 42 11 - 307 ng/mL    Comment: Performed at [facility], [address] [place], [place] [address]  Sedimentation rate     Status: Abnormal   Collection Time: 03/14/21  3:32 PM  Result Value Ref Range   Sed Rate 70 (H) 0 - 30 mm/hr    Comment: Performed at [facility], [address] [place], [place] [address]  Reticulocytes     Status: Abnormal   Collection Time: 03/14/21  3:32 PM  Result Value Ref Range   Retic Ct Pct 7.4 (H) 0.4 - 3.1 %   RBC. 2.81 (L) 3.87 - 5.11 MIL/uL   Retic Count, Absolute 206.5 (H) 19.0 - 186.0 K/uL   Immature Retic Fract 35.0 (H) 2.3 - 15.9 %    Comment: Performed at [facility], [address] [place], [place] [address]  Technologist smear review     Status: None   Collection Time: 03/14/21  3:34 PM  Result Value Ref Range   WBC MORPHOLOGY MORPHOLOGY UNREMARKABLE    RBC MORPHOLOGY POLYCHROMASIA PRESENT     Comment: MIXED RBC POLPULATION  ANISOCYTOSIS    Tech Review Normal platelet morphology     Comment: PLATELETS APPEAR ADEQUATE Performed at [facility], [address] [place], [place] [address]   Basic metabolic panel     Status: Abnormal   Collection Time: 03/17/21  7:17 PM  Result Value Ref Range   Sodium 136 135 - 145 mmol/L   Potassium 4.6 3.5 - 5.1 mmol/L    Comment: HEMOLYSIS AT THIS LEVEL MAY AFFECT RESULT   Chloride 99 98 - 111 mmol/L   CO2 29 22 - 32 mmol/L   Glucose, Bld 101 (H) 70 - 99 mg/dL    Comment: Glucose reference range applies only to samples taken after fasting for at least 8 hours.   BUN 18 8 - 23 mg/dL   Creatinine, Ser 0.93 0.44 - 1.00 mg/dL   Calcium 9.3 8.9 - 10.3 mg/dL   GFR, Estimated >60 >60 mL/min    Comment: (NOTE) Calculated using the CKD-EPI  Creatinine Equation (2021)    Anion gap 8 5 - 15    Comment: Performed at [facility], [address] [place], [place] [address]  CBC     Status: Abnormal   Collection Time: 03/17/21  9:05 PM  Result Value Ref Range   WBC 7.4 4.0 - 10.5 K/uL   RBC 3.24 (L) 3.87 - 5.11 MIL/uL   Hemoglobin 9.9 (L) 12.0 - 15.0 g/dL   HCT 32.5 (L) 36.0 - 46.0 %   MCV 100.3 (H) 80.0 - 100.0 fL   MCH 30.6 26.0 - 34.0 pg   MCHC 30.5 30.0 - 36.0 g/dL   RDW 17.8 (H) 11.5 - 15.5 %   Platelets 350 150 - 400 K/uL   nRBC 0.0 0.0 - 0.2 %    Comment: Performed at [facility] [address] [place], [place] [address]  Troponin I (High Sensitivity)  Status: None   Collection Time: 03/17/21  9:05 PM  Result Value Ref Range   Troponin I (High Sensitivity) 13 <18 ng/L    Comment: (NOTE) Elevated high sensitivity troponin I (hsTnI) values and significant  changes across serial measurements may suggest ACS but many other  chronic and acute conditions are known to elevate hsTnI results.  Refer to the "Links" section for chest pain algorithms and additional  guidance. Performed at [facility], [address] [place], [place] [address]   Brain natriuretic peptide     Status: Abnormal   Collection Time: 03/17/21  9:05 PM  Result Value Ref Range   B Natriuretic Peptide 267.5 (H) 0.0 - 100.0 pg/mL    Comment: Performed at [facility], [address], [place], [place] [address]  Pathologist smear review     Status: None   Collection Time: 04/04/21  1:56 PM  Result Value Ref Range   Path Review Blood smear is reviewed.     Comment: Morphology of RBCs, WBCs, and platelets within normal limits. No evidence of circulating blasts or schistocytes. Reviewed by Elmon Kirschner, M.D. Performed at [facility], [address] [place], [place] [address]   Haptoglobin     Status: None   Collection Time: 04/04/21  1:57 PM  Result Value Ref Range   Haptoglobin 138  42 - 346 mg/dL    Comment: (NOTE) Performed At: [facility] [place], [place] [id number] Rush Farmer MD [id number]   Reticulocytes     Status: Abnormal   Collection Time: 04/04/21  1:57 PM  Result Value Ref Range   Retic Ct Pct 4.6 (H) 0.4 - 3.1 %   RBC. 3.29 (L) 3.87 - 5.11 MIL/uL   Retic Count, Absolute 151.3 19.0 - 186.0 K/uL   Immature Retic Fract 24.0 (H) 2.3 - 15.9 %    Comment: Performed at [facility], [address] [place], [place] [address]  Lactate dehydrogenase     Status: None   Collection Time: 04/04/21  1:57 PM  Result Value Ref Range   LDH 155 98 - 192 U/L    Comment: Performed at [facility], [address] [place], [place] [address]  CBC with Differential/Platelet     Status: Abnormal   Collection Time: 04/04/21  1:57 PM  Result Value Ref Range   WBC 7.3 4.0 - 10.5 K/uL   RBC 3.32 (L) 3.87 - 5.11 MIL/uL   Hemoglobin 9.9 (L) 12.0 - 15.0 g/dL   HCT 32.5 (L) 36.0 - 46.0 %   MCV 97.9 80.0 - 100.0 fL   MCH 29.8 26.0 - 34.0 pg   MCHC 30.5 30.0 - 36.0 g/dL   RDW 15.4 11.5 - 15.5 %   Platelets 326 150 - 400 K/uL   nRBC 0.0 0.0 - 0.2 %   Neutrophils Relative % 74 %   Neutro Abs 5.3 1.7 - 7.7 K/uL   Lymphocytes Relative 13 %   Lymphs Abs 1.0 0.7 - 4.0 K/uL   Monocytes Relative 11 %   Monocytes Absolute 0.8 0.1 - 1.0 K/uL   Eosinophils Relative 1 %   Eosinophils Absolute 0.1 0.0 - 0.5 K/uL   Basophils Relative 1 %   Basophils Absolute 0.1 0.0 - 0.1 K/uL   Immature Granulocytes 0 %   Abs Immature Granulocytes 0.02 0.00 - 0.07 K/uL    Comment: Performed at [facility], [address] [place], [place] [address]  Compliance Drug  Analysis, Ur     Status: None   Collection Time: 04/30/21 12:06 PM  Result Value Ref Range   Summary Note     Comment: ==================================================================== Compliance Drug Analysis,  Ur ==================================================================== Test                             Result       Flag       Units  Drug Present and Declared for Prescription Verification   Alpha-hydroxyalprazolam        107          EXPECTED   ng/mg creat    Alpha-hydroxyalprazolam is an expected metabolite of alprazolam.    Source of alprazolam is a scheduled prescription medication.    Oxycodone                      1371         EXPECTED   ng/mg creat   Oxymorphone                    600          EXPECTED   ng/mg creat   Noroxycodone                   2311         EXPECTED   ng/mg creat    Sources of oxycodone include scheduled prescription medications.    Oxymorphone and noroxycodone are expected metabolites of oxycodone.    Oxymorphone is also available as a scheduled prescription medication.    Sertraline                     PRESENT      EXPECTED   Desmethylsertral ine            PRESENT      EXPECTED    Desmethylsertraline is an expected metabolite of sertraline.    Trazodone                      PRESENT      EXPECTED   1,3 chlorophenyl piperazine    PRESENT      EXPECTED    1,3-chlorophenyl piperazine is an expected metabolite of trazodone.    Acetaminophen                  PRESENT      EXPECTED  Drug Absent but Declared for Prescription Verification   Diclofenac                     Not Detected UNEXPECTED    Diclofenac, as indicated in the declared medication list, is not    always detected even when used as directed.  ==================================================================== Test                      Result    Flag   Units      Ref Range   Creatinine              45               mg/dL      >=20 ==================================================================== Declared Medications:  The flagging and interpretation on this report are based on the  following declared medications.  Unexpected results may arise f rom  inaccuracies in the declared  medications.   **Note: The testing scope  of this panel includes these medications:   Alprazolam (Xanax)  Oxycodone (Percocet)  Sertraline (Zoloft)  Trazodone (Desyrel)   **Note: The testing scope of this panel does not include small to  moderate amounts of these reported medications:   Acetaminophen (Tylenol)  Acetaminophen (Percocet)  Diclofenac (Voltaren)   **Note: The testing scope of this panel does not include the  following reported medications:   Albuterol (Ventolin HFA)  Apixaban (Eliquis)  Atorvastatin (Lipitor)  Bumetanide (Bumex)  Calcium  Flecainide (Tambocor)  Fluticasone (Trelegy)  Iron  Levothyroxine (Synthroid)  Lisinopril (Zestril)  Loratadine (Claritin)  Pantoprazole (Protonix)  Potassium (Klor-Con)  Ubiquinone (CoQ10)  Umeclidinium (Trelegy)  Vilanterol (Trelegy)  Vitamin D ==================================================================== For clinical consultation, please call [phone].  ====================================================================   Comp. Metabolic Panel (12)     Status: None   Collection Time: 04/30/21 12:06 PM  Result Value Ref Range   Glucose 92 65 - 99 mg/dL   BUN 13 8 - 27 mg/dL   Creatinine, Ser 0.80 0.57 - 1.00 mg/dL   eGFR 78 >59 mL/min/1.73   BUN/Creatinine Ratio 16 12 - 28   Sodium 139 134 - 144 mmol/L   Potassium 3.9 3.5 - 5.2 mmol/L   Chloride 98 96 - 106 mmol/L   Calcium 9.0 8.7 - 10.3 mg/dL   Total Protein 6.4 6.0 - 8.5 g/dL   Albumin 4.1 3.7 - 4.7 g/dL   Globulin, Total 2.3 1.5 - 4.5 g/dL   Albumin/Globulin Ratio 1.8 1.2 - 2.2   Bilirubin Total <0.2 0.0 - 1.2 mg/dL   Alkaline Phosphatase 98 44 - 121 IU/L   AST 18 0 - 40 IU/L  Magnesium     Status: None   Collection Time: 04/30/21 12:06 PM  Result Value Ref Range   Magnesium 2.1 1.6 - 2.3 mg/dL  Vitamin B12     Status: Abnormal   Collection Time: 04/30/21 12:06 PM  Result Value Ref Range   Vitamin B-12 >2000 (H) 232 - 1245 pg/mL   Sedimentation rate     Status: None   Collection Time: 04/30/21 12:06 PM  Result Value Ref Range   Sed Rate 40 0 - 40 mm/hr  25-Hydroxy vitamin D Lcms D2+D3     Status: None   Collection Time: 04/30/21 12:06 PM  Result Value Ref Range   25-Hydroxy, Vitamin D 31 ng/mL    Comment: Reference Range: All Ages: Target levels 30 - 100    25-Hydroxy, Vitamin D-2 <1.0 ng/mL    Comment: This test was developed and its performance characteristics determined by [facility]. It has not been cleared or approved by the Food and Drug Administration.    25-Hydroxy, Vitamin D-3 31 ng/mL    Comment: This test was developed and its performance characteristics determined by [facility]. It has not been cleared or approved by the Food and Drug Administration.   C-reactive protein     Status: None   Collection Time: 04/30/21 12:06 PM  Result Value Ref Range   CRP 6 0 - 10 mg/L    Radiology: DG Chest 2 View  Result Date: 03/17/2021 CLINICAL DATA:  Shortness of breath EXAM: CHEST - 2 VIEW COMPARISON:  05/11/2020 FINDINGS: Left-sided implanted cardiac device in stable positioning. Prior median sternotomy and prosthetic heart valve. Stable cardiomediastinal contours. Atherosclerotic calcification of the aortic knob. Mildly prominent bibasilar interstitial markings. No pleural effusion or pneumothorax. IMPRESSION: Mildly prominent bibasilar interstitial markings, which may reflect atelectasis or mild edema. Electronically Signed   By: Davina Poke  D.O.   On: 03/17/2021 16:52    No results found.  No results found.    Assessment and Plan: Patient Active Problem List   Diagnosis Date Noted   Long term prescription benzodiazepine use 04/30/2021   Chronic shoulder pain (1ry area of Pain) (Bilateral) (L>R) 04/30/2021   Chronic upper extremity pain (2ry area of Pain) (Bilateral) (L>R) 04/30/2021   Cervicalgia 04/30/2021   Chronic neck pain (3ry area of Pain) (Posterior) (Bilateral) (L>R) 04/30/2021    Shoulder blade pain (4th area of Pain) (Left) 04/30/2021   Osteoarthritis of glenohumeral joint (Left) 04/30/2021   Osteoarthritis of AC (acromioclavicular) joint (Left) 04/30/2021   Chronic headaches (5th area of Pain) 04/30/2021   Osteoarthritis of glenohumeral joints (Bilateral) 04/30/2021   Osteoarthritis of acromioclavicular joints (Bilateral) 04/30/2021   Primary osteoarthritis of shoulders (Bilateral) 04/30/2021   DDD (degenerative disc disease), cervical 04/30/2021   Cervical radiculitis (Left) 04/30/2021   Cervical radiculopathy (Left) 04/30/2021   C6 radiculopathy (Left) 04/30/2021   C7 radiculopathy (Left) 04/30/2021   Wheelchair bound 04/30/2021   Chronic pain syndrome 04/29/2021   Pharmacologic therapy 04/29/2021   Disorder of skeletal system 04/29/2021   Problems influencing health status 04/29/2021   Chronic anticoagulation (Eliquis) 03/14/2021   Hx of atrioventricular node ablation 03/14/2021   Symptomatic anemia 01/29/2021   Acute on chronic diastolic CHF (congestive heart failure) ([place]) 05/11/2020   COPD (chronic obstructive pulmonary disease) ([name])    Hypothyroidism    Cardiac pacemaker in situ 05/10/2020   Acute non-recurrent frontal sinusitis 04/22/2020   Vertigo 04/22/2020   S/P placement of cardiac pacemaker 02/21/2020   Atrial fibrillation status post cardioversion ([place]) 11/16/2019   Episode of moderate major depression ([place]) 10/10/2019   Persistent atrial fibrillation ([place])    Encounter for general adult medical examination with abnormal findings 07/10/2019   Encounter for screening mammogram for malignant neoplasm of breast 07/10/2019   Atopic dermatitis 05/02/2019   Paroxysmal atrial flutter ([place]) 08/11/2018   Encounter for long-term (current) use of medications 07/09/2018   Chronic left shoulder pain 07/09/2018   Conjunctivitis 07/09/2018   Oxygen dependent 07/09/2018   GAD (generalized anxiety disorder) 07/09/2018   Ovarian failure 07/09/2018    Positive colorectal cancer screening using Cologuard test 04/24/2018   Calculus of gallbladder with acute on chronic cholecystitis 04/08/2018   H/O: CVA (cerebrovascular accident) 04/08/2018   Dysuria 03/02/2018   Urinary tract infection without hematuria 03/02/2018   Right upper quadrant abdominal tenderness without rebound tenderness 03/02/2018   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 exacerbation ([place]) 03/02/2018   Chronic obstructive pulmonary disease ([place]) 09/19/2017   Typical atrial flutter ([name])    Irritable bowel syndrome without diarrhea 01/29/2015   Essential hypertension 01/24/2015   Paroxysmal atrial fibrillation ([place]) 01/24/2015   History of aortic valve replacement with bioprosthetic valve 01/24/2015   Atrial fibrillation with RVR ([place]) 01/11/2015   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 05/18/2014   Shoulder impingement syndrome (Left) 05/18/2014   Cellulitis and abscess 02/13/2014   MRSA (methicillin resistant Staphylococcus aureus) 02/13/2014   Aneurysm, ascending aorta ([place]) 06/23/2013   Bicuspid aortic valve 06/23/2013    1. OSA (obstructive sleep apnea) Would continue with the BiPAP at the current pressure settings.  She seems under well controlled and she is compliant with recommended treatment.  2. Obesity, morbid ([place]) Morbid obesity work on diet and exercise.  She still has significant weight loss to try however states that she gets short of breath and so therefore she is not able  to really lose much weight  3. 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 exacerbation ([place]) On inhalers we will continue to follow along last PFTs were done in July  4. PAF (paroxysmal atrial fibrillation) ([place]) Rate is controlled follow with the cardiologist  5. Dependence on continuous supplemental oxygen Continue oxygen on current settings  General Counseling: I have discussed the findings of the evaluation and examination with Danetra.  I have also discussed  any further diagnostic evaluation thatmay be needed or ordered today. Kamariah verbalizes understanding of the findings of todays visit. We also reviewed her medications today and discussed drug interactions and side effects including but not limited excessive drowsiness and altered mental states. We also discussed that there is always a risk not just to her but also people around her. she has been encouraged to call the office with any questions or concerns that should arise related to todays visit.  No orders of the defined types were placed in this encounter.    Time spent: 3  I have personally obtained a history, examined the patient, evaluated laboratory and imaging results, formulated the assessment and plan and placed orders.    Allyne Gee, MD [facility] Sleep medicine

## 2021-05-16 DIAGNOSIS — I712 Thoracic aortic aneurysm, without rupture: Secondary | ICD-10-CM | POA: Diagnosis not present

## 2021-05-16 DIAGNOSIS — Z953 Presence of xenogenic heart valve: Secondary | ICD-10-CM | POA: Diagnosis not present

## 2021-05-16 DIAGNOSIS — Q231 Congenital insufficiency of aortic valve: Secondary | ICD-10-CM | POA: Diagnosis not present

## 2021-05-19 DIAGNOSIS — I4891 Unspecified atrial fibrillation: Secondary | ICD-10-CM | POA: Diagnosis not present

## 2021-05-19 DIAGNOSIS — I712 Thoracic aortic aneurysm, without rupture: Secondary | ICD-10-CM | POA: Diagnosis not present

## 2021-05-19 DIAGNOSIS — Z23 Encounter for immunization: Secondary | ICD-10-CM | POA: Diagnosis not present

## 2021-05-19 DIAGNOSIS — Q231 Congenital insufficiency of aortic valve: Secondary | ICD-10-CM | POA: Diagnosis not present

## 2021-05-22 ENCOUNTER — Other Ambulatory Visit: Payer: Self-pay

## 2021-05-22 ENCOUNTER — Encounter: Payer: Self-pay | Admitting: Family

## 2021-05-22 ENCOUNTER — Ambulatory Visit: Payer: Medicare Other | Attending: Family | Admitting: Family

## 2021-05-22 VITALS — BP 151/60 | HR 77 | Resp 18 | Ht <= 58 in | Wt 180.0 lb

## 2021-05-22 DIAGNOSIS — M25512 Pain in left shoulder: Secondary | ICD-10-CM | POA: Insufficient documentation

## 2021-05-22 DIAGNOSIS — G479 Sleep disorder, unspecified: Secondary | ICD-10-CM | POA: Diagnosis not present

## 2021-05-22 DIAGNOSIS — I5022 Chronic systolic (congestive) heart failure: Secondary | ICD-10-CM | POA: Diagnosis not present

## 2021-05-22 DIAGNOSIS — I10 Essential (primary) hypertension: Secondary | ICD-10-CM | POA: Diagnosis not present

## 2021-05-22 DIAGNOSIS — R0789 Other chest pain: Secondary | ICD-10-CM | POA: Diagnosis not present

## 2021-05-22 DIAGNOSIS — Z87891 Personal history of nicotine dependence: Secondary | ICD-10-CM | POA: Diagnosis not present

## 2021-05-22 DIAGNOSIS — R42 Dizziness and giddiness: Secondary | ICD-10-CM | POA: Insufficient documentation

## 2021-05-22 DIAGNOSIS — F32A Depression, unspecified: Secondary | ICD-10-CM | POA: Diagnosis not present

## 2021-05-22 DIAGNOSIS — Z952 Presence of prosthetic heart valve: Secondary | ICD-10-CM | POA: Insufficient documentation

## 2021-05-22 DIAGNOSIS — I110 Hypertensive heart disease with heart failure: Secondary | ICD-10-CM | POA: Insufficient documentation

## 2021-05-22 DIAGNOSIS — Z882 Allergy status to sulfonamides status: Secondary | ICD-10-CM | POA: Diagnosis not present

## 2021-05-22 DIAGNOSIS — I5032 Chronic diastolic (congestive) heart failure: Secondary | ICD-10-CM | POA: Diagnosis not present

## 2021-05-22 DIAGNOSIS — Z881 Allergy status to other antibiotic agents status: Secondary | ICD-10-CM | POA: Diagnosis not present

## 2021-05-22 DIAGNOSIS — Z888 Allergy status to other drugs, medicaments and biological substances status: Secondary | ICD-10-CM | POA: Diagnosis not present

## 2021-05-22 DIAGNOSIS — M25511 Pain in right shoulder: Secondary | ICD-10-CM | POA: Diagnosis not present

## 2021-05-22 DIAGNOSIS — J449 Chronic obstructive pulmonary disease, unspecified: Secondary | ICD-10-CM | POA: Diagnosis not present

## 2021-05-22 DIAGNOSIS — I480 Paroxysmal atrial fibrillation: Secondary | ICD-10-CM | POA: Diagnosis not present

## 2021-05-22 NOTE — Patient Instructions (Addendum)
Continue weighing daily and call for an overnight weight gain of > 2 pounds or a weekly weight gain of >5 pounds.    Start taking the carvedilol that Dr. Saralyn Pilar prescribed.

## 2021-05-22 NOTE — Progress Notes (Signed)
Patient ID: [name], female    DOB: 1946/04/15, 75 y.o.   MRN: [id number]  HPI  [name] is a 75 y/o female with a history of hyperlipidemia, HTN, thyroid disease, COPD, left ear deafness, atrial fibrillation, previous tobacco use and chronic heart failure.   Echo report from 05/16/21 reviewed and showed an EF of 45% along with mild LVH. Echo report from 02/07/20 reviewed and showed an EF of >55% along with severe LAE. Catheterization done 02/07/20 but unable to view the results.   Was in the ED 03/17/21 due to shortness of breath where [name] was evaluated and released.   [name] presents today for a follow-up visit with a chief complaint of moderate shortness of breath with little exertion. [name] describes this as chronic in nature having been present for several years. [name] has associated fatigue, cough, intermittent chest pain, light-headedness, depression, difficulty sleeping and bilateral shoulder pain along with this. [name] denies any abdominal distention, palpitations, pedal edema, wheezing or weight gain.   [name] saw cardiology 3 days ago and was prescribed carvedilol. [name] picked it up from the pharmacy but hasn't started it because [name] said [name] didn't know if [name] really needed it or what it was even for.   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 s/p bioprosthetic valve replacement 2008 [facility];  b. 01/2015 Echo: EF 60-65%, no rwma, Gr1 DD, mildly dil LA, nl RV fxn.   Atrial fibrillation with RVR ([place]) 01/11/2015   Atrial flutter ([place])    a. 08/2016 s/p DCCV.  Remains on flecainide 50 mg bid.   Basal skull fracture ([name]) 20 yrs ago   CHF (congestive heart failure) ([name])    Chronic respiratory failure ([name])    COPD (chronic obstructive pulmonary disease) ([place])    a. 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 2008 prior to Aortic  aneurysm repair-->nl cors.   History of stress test    a. 10/2015 MV: no ischemia/infarct.   HLD (hyperlipidemia)    HTN (hypertension)    Hypothyroidism    Obesity    PAF (paroxysmal atrial fibrillation) ([name])    a. on Eliquis; b. CHADS2VASc = 3 (HTN, age x 40, female).   Paroxysmal atrial fibrillation ([place]) 01/24/2015   Right upper quadrant abdominal tenderness without rebound tenderness 03/02/2018   S/P ascending aortic aneurysm repair 2008   Past Surgical History:  Procedure Laterality Date   ABDOMINAL AORTIC ANEURYSM REPAIR  2008   ABDOMINAL HYSTERECTOMY     AORTIC VALVE REPLACEMENT  2008   CARDIAC CATHETERIZATION     [facility]   CARDIOVERSION N/A 08/15/2018   Procedure: CARDIOVERSION;  Surgeon: [name], MD;  Location: [facility] ORS;  Service: Cardiovascular;  Laterality: N/A;   CARDIOVERSION N/A 11/14/2018   Procedure: CARDIOVERSION (CATH LAB);  Surgeon: [name], MD;  Location: [place] [facility];  Service: Cardiovascular;  Laterality: N/A;   CARDIOVERSION N/A 06/05/2019   Procedure: CARDIOVERSION;  Surgeon: [name], MD;  Location: [place] [facility];  Service: Cardiovascular;  Laterality: N/A;   CARDIOVERSION N/A 08/14/2019   Procedure: CARDIOVERSION;  Surgeon: [name], MD;  Location: [place] [facility];  Service: Cardiovascular;  Laterality: N/A;   CARDIOVERSION N/A 11/09/2019   Procedure: CARDIOVERSION;  Surgeon: Isaias Cowman,  MD;  Location: [facility] ORS;  Service: Cardiovascular;  Laterality: N/A;   CARDIOVERSION N/A 01/17/2020   Procedure: CARDIOVERSION;  Surgeon: Isaias Cowman, MD;  Location: [facility] ORS;  Service: Cardiovascular;  Laterality: N/A;   CARPAL TUNNEL RELEASE     ELECTROPHYSIOLOGIC STUDY N/A 08/17/2016   Procedure: Cardioversion;  Surgeon: [name], MD;  Location: [facility] ORS;  Service: Cardiovascular;  Laterality: N/A;   TUMOR EXCISION Left    x3 (arm)   Family History  Problem Relation Age of Onset   Stroke Father    Stroke Paternal Grandmother     Social History   Tobacco Use   Smoking status: Former    Types: Cigarettes   Smokeless tobacco: Never  Substance Use Topics   Alcohol use: No   Allergies  Allergen Reactions   Benadryl [Diphenhydramine Hcl (Sleep)] Palpitations   Cetirizine Palpitations   Lasix [Furosemide] Rash   Levaquin [Levofloxacin In D5w] Other (See Comments)    Reaction:  Fatigue and muscle soreness   Meloxicam Rash   Soy Allergy Hives and Nausea And Vomiting   Sulfa Antibiotics Rash   Prior to Admission medications   Medication Sig Start Date End Date Taking? Authorizing Provider  ALPRAZolam (XANAX) 0.25 MG tablet Take 1 tablet (0.25 mg total) by mouth 2 (two) times daily as needed for anxiety. 04/21/21  Yes [name], MD  atorvastatin (LIPITOR) 40 MG tablet 1 tab po qhs 03/06/21  Yes Abernathy, [name], NP  bumetanide (BUMEX) 1 MG tablet Take 2 tablets (2 mg total) by mouth 2 (two) times daily. 08/20/20 03/14/22 Yes Nely Dedmon, Otila Kluver A, FNP  Calcium Carbonate-Vitamin D (CALCIUM 600+D PO) Take 1 tablet by mouth daily.   Yes [provider]  carvedilol (COREG) 3.125 MG tablet Take 3.125 mg by mouth 2 (two) times daily with a meal.   Yes [provider]  Coenzyme Q10 (COQ10) 200 MG CAPS Take 200 mg by mouth daily.   Yes [provider]  diclofenac Sodium (VOLTAREN) 1 % GEL SMARTSIG:Gram(s) Topical Twice Daily 09/09/20  Yes [provider]  ELIQUIS 5 MG TABS tablet Take 1 tablet by mouth twice daily. Patient taking differently: Take 5 mg by mouth 2 (two) times daily. 10/26/19  Yes [name], MD  ferrous sulfate 325 (65 FE) MG tablet Take 2 tablets (650 mg total) by mouth 2 (two) times daily with a meal. Patient taking differently: Take 325 mg by mouth. Takes 4 tablets by mouth twice a day 01/30/21  Yes Jennye Boroughs, MD  flecainide (TAMBOCOR) 50 MG tablet Take 1 tablet (50 mg total) by mouth 2 (two) times daily. PLEASE CONTACT OFFICE [phone] TO SCHEDULE APPOINTMENT  FOR FUTURE REFILLS 02/23/20  Yes Deboraha Sprang, MD  levothyroxine (SYNTHROID) 25 MCG tablet Take 1 tablet (25 mcg total) by mouth daily before breakfast. 12/24/20  Yes [name], MD  lisinopril (ZESTRIL) 40 MG tablet Take 1 tablet (40 mg total) by mouth daily. 10/07/20 03/14/22 Yes Imre Vecchione A, FNP  loratadine (CLARITIN) 10 MG tablet Take 10 mg by mouth daily.   Yes [provider]  oxyCODONE-acetaminophen (PERCOCET) 7.5-325 MG tablet Take one tab in am and half in pm for severe shoulder pain 05/06/21  Yes [name], MD  pantoprazole (PROTONIX) 40 MG tablet Take 1 tablet (40 mg total) by mouth daily. 12/24/20  Yes [name], MD  potassium chloride (KLOR-CON) 10 MEQ tablet Take 4 tablets (40 mEq total) by mouth 2 (two) times daily.  11/28/20  Yes [name], MD  sertraline (ZOLOFT) 50 MG tablet Take 1 tablet (50 mg total) by mouth daily. 02/10/21  Yes [name], MD  traZODone (DESYREL) 50 MG tablet Take 1 tablet (50 mg total) by mouth at bedtime. 02/20/21  Yes [name], MD  TRELEGY ELLIPTA 100-62.5-25 MCG/INH AEPB Inhale 1 puff by mouth into the lungs daily. 12/24/20  Yes [name], MD  VENTOLIN HFA 108 (90 Base) MCG/ACT inhaler INHALE 2 PUFFS INTO THE LUNGS EVERY 6 HOURS AS NEEDED FOR WHEEZING OR SHORTNESS OF BREATH 02/17/21  Yes Abernathy, Yetta Flock, NP   Review of Systems  Constitutional:  Positive for appetite change (decreased) and fatigue (tire easily).  HENT:  Positive for hearing loss. Negative for congestion and sore throat.   Eyes: Negative.   Respiratory:  Positive for cough and shortness of breath (easily). Negative for wheezing.   Cardiovascular:  Positive for chest pain (at times). Negative for palpitations and leg swelling.  Gastrointestinal:  Negative for abdominal distention and abdominal pain.  Endocrine: Negative.   Genitourinary: Negative.   Musculoskeletal:  Positive for arthralgias (bilateral shoulder pain). Negative for back pain.  Skin: Negative.    Allergic/Immunologic: Negative.   Neurological:  Positive for light-headedness (at times). Negative for dizziness.  Hematological:  Negative for adenopathy. Does not bruise/bleed easily.  Psychiatric/Behavioral:  Positive for dysphoric mood and sleep disturbance (trouble sleeping due to shoulder pain; wearing cpap at bedtime). The patient is not nervous/anxious.    Vitals:   05/22/21 1334  BP: (!) 151/60  Pulse: 77  Resp: 18  SpO2: 95%  Weight: 180 lb (81.6 kg)  Height: 4\' 10"  (1.473 m)   Wt Readings from Last 3 Encounters:  05/22/21 180 lb (81.6 kg)  05/13/21 182 lb (82.6 kg)  04/30/21 186 lb (84.4 kg)   Lab Results  Component Value Date   CREATININE 0.80 04/30/2021   CREATININE 0.93 03/17/2021   CREATININE 0.81 03/14/2021    Physical Exam Vitals and nursing note reviewed.  Constitutional:      Appearance: Normal appearance.  HENT:     Head: Normocephalic and atraumatic.     Left Ear: Decreased hearing noted.  Cardiovascular:     Rate and Rhythm: Normal rate and regular rhythm.  Pulmonary:     Effort: Pulmonary effort is normal. No respiratory distress.     Breath sounds: No wheezing or rales.  Abdominal:     General: There is no distension.     Palpations: Abdomen is soft.  Musculoskeletal:     Cervical back: Normal range of motion and neck supple.     Right lower leg: No edema.     Left lower leg: No edema.  Skin:    General: Skin is warm and dry.  Neurological:     General: No focal deficit present.     Mental Status: [name] is alert and oriented to person, place, and time.  Psychiatric:        Mood and Affect: Mood normal.        Behavior: Behavior normal.        Thought Content: Thought content normal.   Assessment & Plan:  1: Chronic heart failure with mildly reduced ejection fraction- - NYHA class III - euvolemic today - weighing daily; reminded to call for an overnight weight gain of >2 pounds or a weekly weight gain of >5 pounds - weight up 4  pounds from last visit here 6 months ago - not  adding salt and is using pepper for seasoning; reviewed the importance of closely following a low sodium diet  - saw cardiology ([place]) 05/19/21 who prescribed carvedilol 3.125mg  BID; [name] hadn't started it yet because [name] says [name] didn't know what it was for; explained the use of this medication and [name] says that [name] will start it tomorrow morning - saw cardiothoracic provider Ysidro Evert) 09/04/20 for aortic valve - on GDMT of lisinopril  - starting carvedilol per above; discussed changing her lisinopril to entresto at her next visit - has had previous valve surgery  - BNP 03/17/21 was 267.5  2: HTN- - BP mildly elevated - saw PCP Stephanie Coup) 04/29/21 - BMP 04/30/21 reviewed and showed sodium 139, potassium 3.9, creatinine 0.8 and GFR 78  3: severe COPD- - wearing oxygen at 3.5L around the clock at home; when [name] comes to appointments, [name] has to put her portable tank on 2L or it'll run out - saw pulmonology Humphrey Rolls) 05/13/21  4: Atrial fibrillation-  - dual chamber pacemaker placed June 2021 - has had multiple cardioversions   Medication list was reviewed.   Return in 1 month or sooner for any questions/problems before then.

## 2021-05-23 ENCOUNTER — Other Ambulatory Visit: Payer: Self-pay | Admitting: Internal Medicine

## 2021-05-27 ENCOUNTER — Ambulatory Visit: Payer: Medicare Other | Admitting: Gastroenterology

## 2021-05-30 ENCOUNTER — Telehealth: Payer: Self-pay | Admitting: Gastroenterology

## 2021-05-30 NOTE — Telephone Encounter (Signed)
New pt. Of Dr. Allen Norris having pain wants to know if he can tell her what to take for gallstones before her initial first appt with Dr. Allen Norris.

## 2021-06-02 ENCOUNTER — Other Ambulatory Visit: Payer: Self-pay | Admitting: Nurse Practitioner

## 2021-06-02 ENCOUNTER — Telehealth: Payer: Self-pay

## 2021-06-02 DIAGNOSIS — J449 Chronic obstructive pulmonary disease, unspecified: Secondary | ICD-10-CM | POA: Diagnosis not present

## 2021-06-02 DIAGNOSIS — G8929 Other chronic pain: Secondary | ICD-10-CM

## 2021-06-02 DIAGNOSIS — M25511 Pain in right shoulder: Secondary | ICD-10-CM

## 2021-06-02 MED ORDER — OXYCODONE-ACETAMINOPHEN 7.5-325 MG PO TABS: ORAL_TABLET | ORAL | 0 refills | Status: DC

## 2021-06-02 NOTE — Telephone Encounter (Signed)
Patient called this morning in regards to her Oxycodone with Tyelnol. She states that she takes a pill and a half a day to help minimize her pain and she will run out of her medicine before her refill which is due Friday. I spoke with Alysaa and she will send additional pills to get her through until her refill. Will be sent by this afternoon per [name]. Secure chat sent to her. Tried to advise patient but no voicemail and no answer when patient was called.

## 2021-06-02 NOTE — Telephone Encounter (Signed)
Returned pt's call regarding what she can take for her gallbladder attacks. Advised pt we have not seen her in clinic so Dr. Allen Norris will not prescribe any medications for pain. Pt verbalized understanding and will keep her office appt for November.

## 2021-06-06 ENCOUNTER — Inpatient Hospital Stay: Payer: Medicare Other | Admitting: Internal Medicine

## 2021-06-06 ENCOUNTER — Inpatient Hospital Stay: Payer: Medicare Other

## 2021-06-11 ENCOUNTER — Other Ambulatory Visit: Payer: Self-pay | Admitting: Internal Medicine

## 2021-06-11 DIAGNOSIS — J449 Chronic obstructive pulmonary disease, unspecified: Secondary | ICD-10-CM

## 2021-06-16 NOTE — Progress Notes (Signed)
PROVIDER NOTE: Information contained herein reflects review and annotations entered in association with encounter. Interpretation of such information and data should be left to medically-trained personnel. Information provided to patient can be located elsewhere in the medical record under "Patient Instructions". Document created using STT-dictation technology, any transcriptional errors that may result from process are unintentional.    Patient: [name]  Service Category: E/M  Provider: Gaspar Cola, MD  DOB: 03/07/1946  DOS: 06/18/2021  Specialty: Interventional Pain Management  MRN: [id number]  Setting: Ambulatory outpatient  PCP: [name], MD  Type: Established Patient    Referring Provider: [name], MD  Location: Office  Delivery: Face-to-face     Primary Reason(s) for Visit: Encounter for evaluation before starting new chronic pain management plan of care (Level of risk: moderate) CC: Neck Pain  HPI  [name] is a 75 y.o. year old, female patient, who comes today for a follow-up evaluation to review the test results and decide on a treatment plan. 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; Shoulder impingement syndrome (Left); Irritable bowel syndrome without diarrhea; MRSA (methicillin resistant Staphylococcus aureus); Dysuria; Urinary tract infection without hematuria; Right upper quadrant abdominal tenderness without rebound tenderness; 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 exacerbation ([place]); Positive colorectal cancer screening using Cologuard test; Calculus of gallbladder with acute on chronic cholecystitis; H/O: CVA (cerebrovascular  accident); Encounter for long-term (current) use of medications; Chronic left shoulder pain; Conjunctivitis; Oxygen dependent; GAD (generalized anxiety disorder); Ovarian failure; Paroxysmal atrial flutter ([place]); Atopic dermatitis; Encounter for general adult medical examination with abnormal findings; Encounter for screening mammogram for malignant neoplasm of breast; Persistent atrial fibrillation ([place]); Episode of moderate major depression ([place]); S/P placement of cardiac pacemaker; Acute non-recurrent frontal sinusitis; Vertigo; Acute on chronic diastolic CHF (congestive heart failure) ([place]); COPD (chronic obstructive pulmonary disease) ([place]); Hypothyroidism; Symptomatic anemia; Cardiac pacemaker in situ; Chronic anticoagulation (Eliquis); Atrial fibrillation status post cardioversion [facility]); Hx of atrioventricular node ablation; Chronic pain syndrome; Pharmacologic therapy; Disorder of skeletal system; Problems influencing health status; Long term prescription benzodiazepine use; Chronic shoulder pain (1ry area of Pain) (Bilateral) (L>R); Chronic upper extremity pain (2ry area of Pain) (Bilateral) (L>R); Cervicalgia; Chronic neck pain (3ry area of Pain) (Posterior) (Bilateral) (L>R); Shoulder blade pain (4th area of Pain) (Left); Osteoarthritis of glenohumeral joint (Left); Osteoarthritis of AC (acromioclavicular) joint (Left); Chronic headaches (5th area of Pain); Osteoarthritis of glenohumeral joints (Bilateral); Osteoarthritis of acromioclavicular joints (Bilateral); Primary osteoarthritis of shoulders (Bilateral); DDD (degenerative disc disease), cervical; Cervical radiculitis (Left); Cervical radiculopathy (Left); C6 radiculopathy (Left); C7 radiculopathy (Left); and Wheelchair bound on their problem list. Her primarily concern today is the Neck Pain  Pain Assessment: Location:   Neck Radiating: radiates shoulders bilateral down arms to elbow, pain will radiate down to wrist on right arm Onset:    Duration: Chronic pain Quality: Aching, Burning, Constant, Sharp, Shooting, Discomfort Severity: 8 /10 (subjective, self-reported pain score)  Effect on ADL: hard to sleep , lift arms carrying things Timing: Constant Modifying factors: rest, meds , gel BP: (!) 174/92  HR: 78  [name] comes in today for a follow-up visit after her initial evaluation on 04/30/2021. Today we went over the results of her tests. These were explained in "Layman's terms". During today's appointment we  went over my diagnostic impression, as well as the proposed treatment plan.  According to the patient the primary area of pain is that of the shoulders (Bilateral) (L>R).  She denies any prior surgeries but she does admit to having had bilateral shoulder steroid injections every 3 months by Morley Kos, for the past 2 years, which provides her with approximately 2 weeks of pain relief.  She indicates having tried some home physical therapy but the physical therapist indicated that he may not be able to help her with her shoulder problems and provided her with some exercises that she could do on her own.  She indicates that she was unable to tolerate the exercises and she stopped the physical therapy.  Older x-rays available through the electronic medical record indicate the patient has bilateral osteoarthritis affecting the glenohumeral and acromioclavicular joints.  Exam of the shoulder shows the patient to be unable to abduct the left shoulder past 90 degrees.   The patient's secondary area pain is that of the upper extremities (Bilateral) (L>R).  Indicates of the right upper extremity the pain goes from the shoulder to the elbow but does not go any further than that.  Indicates of the left upper extremity the pain goes from the shoulder all the way down into her fingers where she indicates it will affect her index and middle finger and what appears to be a C7/C6 dermatomal pattern.  The patient denies any upper extremity  surgeries, x-rays, physical therapy, or nerve blocks or joint injections or other than those for the shoulder.   The patient's third area pain is that of the neck (Posterior) (Bilateral) (L>R).  The patient denies any surgeries, recent x-rays, physical therapy, or any kind of nerve blocks in the cervical region.  She denies any occipital headaches.   The patient's fourth area pain is that of the left shoulder blade.  No prior surgeries, x-rays, physical therapy, or nerve blocks in that area.   The patient's fifth area pain is that of the frontal headaches.  She denies any surgeries, x-rays, physical therapy, or nerve blocks for that particular pain.   In addition to the above, the patient has multiple medical problems including being wheelchair-bound secondary to her pain, having severe COPD and oxygen dependent running at 3.5 L/min.  Patient has a history of having been a smoker but having quit 14-1/2 years ago.  However, she has ended up with COPD, congestive heart failure, and she also required open heart surgery for an hour take valve replacement in 2008.  She has a pacemaker, a history of atrial fibrillation and atrial flutter that appears to persist despite cardioversion and atrioventricular node ablation.  She also has an aneurysm of the ascending aorta and she is on chronic anticoagulation with Eliquis.  On the patient's last visit (04/30/2021) lab work was ordered.  In addition, an x-ray of the cervical spine on flexion and extension, a CT of the cervical spine and a CT of the left shoulder were ordered.  All of her lab work was within normal limits except for her vitamin B12 which came back elevated.  According to my review of records, none of the imaging work-up that I ordered was done.  2 views x-rays of the right shoulder done on 05/09/2021 demonstrated significant degenerative changes with complete loss of the glenohumeral joint space.  It also shows mild decreased subacromial space and a type  I acromion.  2 views x-rays of the left shoulder done on 05/09/2021  demonstrated significant degenerative changes with complete loss of the glenohumeral joint space.  It also shows mild decrease in the subacromial space and a type I acromion.  At this point we will be scheduling this patient to return for a diagnostic bilateral suprascapular nerve block under fluoroscopic guidance, no sedation.  We will have the patient stop her Eliquis for 3 days prior to the procedure.  Controlled Substance Pharmacotherapy Assessment REMS (Risk Evaluation and Mitigation Strategy)  Analgesic: .   Oxycodone/APAP 7.5/325, 1 tab p.o. daily (# 45) (last filled on 06/02/2021) by Erich Montane, FNP-C, working for Dr. Talmadge Chad, MD. MME/day: 7.5 mg/day  Pill Count: None expected due to no prior prescriptions written by our practice. Ignatius Specking, RN  06/18/2021  1:12 PM  Sign when Signing Visit Safety precautions to be maintained throughout the outpatient stay will include: orient to surroundings, keep bed in low position, maintain call bell within reach at all times, provide assistance with transfer out of bed and ambulation.     Pharmacokinetics: Liberation and absorption (onset of action): WNL Distribution (time to peak effect): WNL Metabolism and excretion (duration of action): WNL         Pharmacodynamics: Desired effects: Analgesia: Ms. Delsignore reports >50% benefit. Functional ability: Patient reports that medication allows her to accomplish basic ADLs Clinically meaningful improvement in function (CMIF): Sustained CMIF goals met Perceived effectiveness: Described as relatively effective, allowing for increase in activities of daily living (ADL) Undesirable effects: Side-effects or Adverse reactions: None reported Monitoring: [place] PMP: PDMP reviewed during this encounter. Online review of the past 59-monthperiod previously conducted. Not applicable at this point since we have not taken  over the patient's medication management yet. List of other Serum/Urine Drug Screening Test(s):  No results found for: AMPHSCRSER, BARBSCRSER, BENZOSCRSER, COCAINSCRSER, COCAINSCRNUR, PCPSCRSER, THCSCRSER, THCU, CANNABQUANT, [place] PNogal EBillingsList of all UDS test(s) done:  Lab Results  Component Value Date   SUMMARY Note 04/30/2021   Last UDS on record: Summary  Date Value Ref Range Status  04/30/2021 Note  Final    Comment:    ==================================================================== Compliance Drug Analysis, Ur ==================================================================== Test                             Result       Flag       Units  Drug Present and Declared for Prescription Verification   Alpha-hydroxyalprazolam        107          EXPECTED   ng/mg creat    Alpha-hydroxyalprazolam is an expected metabolite of alprazolam.    Source of alprazolam is a scheduled prescription medication.    Oxycodone                      1371         EXPECTED   ng/mg creat   Oxymorphone                    600          EXPECTED   ng/mg creat   Noroxycodone                   2311         EXPECTED   ng/mg creat    Sources of oxycodone include scheduled prescription medications.    Oxymorphone and noroxycodone are expected metabolites of oxycodone.  Oxymorphone is also available as a scheduled prescription medication.    Sertraline                     PRESENT      EXPECTED   Desmethylsertraline            PRESENT      EXPECTED    Desmethylsertraline is an expected metabolite of sertraline.    Trazodone                      PRESENT      EXPECTED   1,3 chlorophenyl piperazine    PRESENT      EXPECTED    1,3-chlorophenyl piperazine is an expected metabolite of trazodone.    Acetaminophen                  PRESENT      EXPECTED  Drug Absent but Declared for Prescription Verification   Diclofenac                     Not Detected UNEXPECTED    Diclofenac,  as indicated in the declared medication list, is not    always detected even when used as directed.  ==================================================================== Test                      Result    Flag   Units      Ref Range   Creatinine              45               mg/dL      >=20 ==================================================================== Declared Medications:  The flagging and interpretation on this report are based on the  following declared medications.  Unexpected results may arise from  inaccuracies in the declared medications.   **Note: The testing scope of this panel includes these medications:   Alprazolam (Xanax)  Oxycodone (Percocet)  Sertraline (Zoloft)  Trazodone (Desyrel)   **Note: The testing scope of this panel does not include small to  moderate amounts of these reported medications:   Acetaminophen (Tylenol)  Acetaminophen (Percocet)  Diclofenac (Voltaren)   **Note: The testing scope of this panel does not include the  following reported medications:   Albuterol (Ventolin HFA)  Apixaban (Eliquis)  Atorvastatin (Lipitor)  Bumetanide (Bumex)  Calcium  Flecainide (Tambocor)  Fluticasone (Trelegy)  Iron  Levothyroxine (Synthroid)  Lisinopril (Zestril)  Loratadine (Claritin)  Pantoprazole (Protonix)  Potassium (Klor-Con)  Ubiquinone (CoQ10)  Umeclidinium (Trelegy)  Vilanterol (Trelegy)  Vitamin D ==================================================================== For clinical consultation, please call [phone]. ====================================================================    UDS interpretation: No unexpected findings.          Medication Assessment Form: Not applicable. Treatment compliance: Not applicable Risk Assessment Profile: Aberrant behavior: See initial evaluations. None observed or detected today Comorbid factors increasing risk of overdose: See initial evaluation. No additional risks detected  today Opioid risk tool (ORT):  Opioid Risk  06/18/2021  Alcohol 0  Illegal Drugs 0  Rx Drugs 0  Alcohol 0  Illegal Drugs 0  Rx Drugs 0  Age between 16-45 years  0  History of Preadolescent Sexual Abuse 0  Psychological Disease 0  Depression 1  Opioid Risk Tool Scoring 1  Opioid Risk Interpretation Low Risk    ORT Scoring interpretation table:  Score <3 = Low Risk for SUD  Score between 4-7 = Moderate Risk for SUD  Score >  8 = High Risk for Opioid Abuse   Risk of substance use disorder (SUD): Low  Risk Mitigation Strategies:  Patient opioid safety counseling: No controlled substances prescribed. Patient-Prescriber Agreement (PPA): No agreement signed.  Controlled substance notification to other providers: None required. No opioid therapy.  Pharmacologic Plan: No opioid analgesic prescribed.             Laboratory Chemistry Profile   Renal Lab Results  Component Value Date   BUN 13 04/30/2021   CREATININE 0.80 04/30/2021   BCR 16 04/30/2021   GFRAA >60 05/14/2020   GFRNONAA >60 03/17/2021   SPECGRAV 1.014 07/10/2019   PHUR 7.5 07/10/2019   PROTEINUR NEGATIVE 01/29/2021     Electrolytes Lab Results  Component Value Date   NA 139 04/30/2021   K 3.9 04/30/2021   CL 98 04/30/2021   CALCIUM 9.0 04/30/2021   MG 2.1 04/30/2021   PHOS 3.5 01/30/2021     Hepatic Lab Results  Component Value Date   AST 18 04/30/2021   ALT 13 03/14/2021   ALBUMIN 4.1 04/30/2021   ALKPHOS 98 04/30/2021   LIPASE 124 10/16/2011     ID Lab Results  Component Value Date   SARSCOV2NAA NEGATIVE 01/29/2021     Bone Lab Results  Component Value Date   VD25OH 25.5 (L) 11/25/2020   25OHVITD1 31 04/30/2021   25OHVITD2 <1.0 04/30/2021   25OHVITD3 31 04/30/2021     Endocrine Lab Results  Component Value Date   GLUCOSE 92 04/30/2021   GLUCOSEU NEGATIVE 01/29/2021   HGBA1C 5.7 (H) 07/17/2019   TSH 2.406 03/14/2021   FREET4 1.20 11/25/2020     Neuropathy Lab Results   Component Value Date   VITAMINB12 >2000 (H) 04/30/2021   FOLATE 60.0 03/14/2021   HGBA1C 5.7 (H) 07/17/2019     CNS No results found for: COLORCSF, APPEARCSF, RBCCOUNTCSF, WBCCSF, POLYSCSF, LYMPHSCSF, EOSCSF, PROTEINCSF, GLUCCSF, JCVIRUS, CSFOLI, IGGCSF, LABACHR, ACETBL, LABACHR, ACETBL   Inflammation (CRP: Acute  ESR: Chronic) Lab Results  Component Value Date   CRP 6 04/30/2021   ESRSEDRATE 40 04/30/2021     Rheumatology Lab Results  Component Value Date   ANA Negative 03/14/2021     Coagulation Lab Results  Component Value Date   INR 1.1 01/29/2021   LABPROT 14.5 01/29/2021   APTT 34 01/29/2021   PLT 326 04/04/2021     Cardiovascular Lab Results  Component Value Date   BNP 267.5 (H) 03/17/2021   CKTOTAL 107 08/14/2012   CKMB 0.7 08/14/2012   TROPONINI <0.03 10/31/2018   HGB 9.9 (L) 04/04/2021   HCT 32.5 (L) 04/04/2021     Screening Lab Results  Component Value Date   SARSCOV2NAA NEGATIVE 01/29/2021     Cancer No results found for: CEA, CA125, LABCA2   Allergens No results found for: ALMOND, APPLE, ASPARAGUS, AVOCADO, BANANA, BARLEY, BASIL, BAYLEAF, GREENBEAN, LIMABEAN, WHITEBEAN, BEEFIGE, REDBEET, BLUEBERRY, BROCCOLI, CABBAGE, MELON, CARROT, CASEIN, CASHEWNUT, CAULIFLOWER, CELERY     Note: Lab results reviewed.  Recent Diagnostic Imaging Review  Shoulder Imaging: Shoulder-R DG: Results for orders placed during the hospital encounter of 12/12/20 DG Shoulder Right  Narrative CLINICAL DATA:  Acute on chronic RIGHT shoulder pain, RIGHT grip strength weakness, unable to lift RIGHT arm  EXAM: RIGHT SHOULDER - 2+ VIEW  COMPARISON:  04/23/2014  FINDINGS: Osseous demineralization.  AC joint alignment normal with mild degenerative changes noted.  Spur formation at the RIGHT glenohumeral joint.  Visualized ribs intact.  No acute fracture, dislocation, or  bone destruction.  IMPRESSION: Progressive degenerative changes at RIGHT glenohumeral and  RIGHT acromioclavicular joints.   Electronically Signed By: Lavonia Dana M.D. On: 12/12/2020 15:14  Shoulder-L DG: Results for orders placed during the hospital encounter of 07/11/18 DG Shoulder Left  Narrative CLINICAL DATA:  Left shoulder pain.  No injury.  EXAM: LEFT SHOULDER - 2+ VIEW  COMPARISON:  04/23/2014  FINDINGS: Exam demonstrates moderate degenerative changes of the glenohumeral joint with progression since the previous exam. Minimal degenerate change of the [facility] joint. No acute fracture or dislocation.  IMPRESSION: No acute findings.  Moderate degenerative changes of the glenohumeral joint and minimal degenerate change of the [facility] joint.   Electronically Signed By: Marin Olp M.D. On: 07/11/2018 14:30  Foot Imaging: Foot-R DG Complete: Results for orders placed during the hospital encounter of 02/14/20 DG Foot Complete Right  Narrative CLINICAL DATA:  Status post trauma.  EXAM: RIGHT FOOT COMPLETE - 3+ VIEW  COMPARISON:  None.  FINDINGS: There is no evidence of fracture or dislocation. Mild degenerative changes seen along the dorsal aspect of the mid right foot. Mild soft tissue swelling is seen along the dorsal aspect of the proximal to mid right foot.  IMPRESSION: Mild dorsal soft tissue swelling without evidence of acute fracture.   Electronically Signed By: Virgina Norfolk M.D. On: 02/13/2020 23:23  Complexity Note: Imaging results reviewed. Results shared with Ms. Tammen, using Layman's terms.                        Meds   Current Outpatient Medications:    ALPRAZolam (XANAX) 0.25 MG tablet, Take 1 tablet (0.25 mg total) by mouth 2 (two) times daily as needed for anxiety., Disp: 60 tablet, Rfl: 2   atorvastatin (LIPITOR) 40 MG tablet, 1 tab po qhs, Disp: 90 tablet, Rfl: 1   bumetanide (BUMEX) 1 MG tablet, Take 2 tablets (2 mg total) by mouth 2 (two) times daily., Disp: 360 tablet, Rfl: 3   Calcium Carbonate-Vitamin D (CALCIUM 600+D  PO), Take 1 tablet by mouth daily., Disp: , Rfl:    carvedilol (COREG) 3.125 MG tablet, Take 3.125 mg by mouth 2 (two) times daily with a meal., Disp: , Rfl:    carvedilol (COREG) 3.125 MG tablet, Take 3.125 mg by mouth 2 (two) times daily with a meal., Disp: , Rfl:    Coenzyme Q10 (COQ10) 200 MG CAPS, Take 200 mg by mouth daily., Disp: , Rfl:    diclofenac Sodium (VOLTAREN) 1 % GEL, SMARTSIG:Gram(s) Topical Twice Daily, Disp: , Rfl:    ELIQUIS 5 MG TABS tablet, Take 1 tablet by mouth twice daily. (Patient taking differently: Take 5 mg by mouth 2 (two) times daily.), Disp: 180 tablet, Rfl: 2   ferrous sulfate 325 (65 FE) MG tablet, Take 2 tablets (650 mg total) by mouth 2 (two) times daily with a meal. (Patient taking differently: Take 325 mg by mouth. Takes 4 tablets by mouth twice a day), Disp: , Rfl: 3   flecainide (TAMBOCOR) 50 MG tablet, Take 1 tablet (50 mg total) by mouth 2 (two) times daily. PLEASE CONTACT OFFICE [phone] TO SCHEDULE APPOINTMENT FOR FUTURE REFILLS, Disp: 30 tablet, Rfl: 0   levothyroxine (SYNTHROID) 25 MCG tablet, Take 1 tablet (25 mcg total) by mouth daily before breakfast., Disp: 90 tablet, Rfl: 1   lisinopril (ZESTRIL) 40 MG tablet, Take 1 tablet (40 mg total) by mouth daily., Disp: 90 tablet, Rfl: 3   loratadine (CLARITIN) 10  MG tablet, Take 10 mg by mouth daily., Disp: , Rfl:    oxyCODONE-acetaminophen (PERCOCET) 7.5-325 MG tablet, Take one tab in am and half in pm for severe shoulder pain, Disp: 45 tablet, Rfl: 0   pantoprazole (PROTONIX) 40 MG tablet, Take 1 tablet (40 mg total) by mouth daily., Disp: 90 tablet, Rfl: 1   potassium chloride (KLOR-CON) 10 MEQ tablet, Take 4 tablets (40 mEq total) by mouth 2 (two) times daily., Disp: 720 tablet, Rfl: 3   sertraline (ZOLOFT) 50 MG tablet, Take 1 tablet (50 mg total) by mouth daily., Disp: 90 tablet, Rfl: 1   traZODone (DESYREL) 50 MG tablet, Take 1 tablet by mouth at bedtime., Disp: 90 tablet, Rfl: 1   TRELEGY ELLIPTA  100-62.5-25 MCG/INH AEPB, INHALE 1 PUFF INTO THE LUNGS DAILY, Disp: 60 each, Rfl: 3   VENTOLIN HFA 108 (90 Base) MCG/ACT inhaler, INHALE 2 PUFFS INTO THE LUNGS EVERY 6 HOURS AS NEEDED FOR WHEEZING OR SHORTNESS OF BREATH, Disp: 18 g, Rfl: 3  ROS  Constitutional: Denies any fever or chills Gastrointestinal: No reported hemesis, hematochezia, vomiting, or acute GI distress Musculoskeletal: Denies any acute onset joint swelling, redness, loss of ROM, or weakness Neurological: No reported episodes of acute onset apraxia, aphasia, dysarthria, agnosia, amnesia, paralysis, loss of coordination, or loss of consciousness  Allergies  Ms. Halm is allergic to benadryl [diphenhydramine hcl (sleep)], cetirizine, lasix [furosemide], levaquin [levofloxacin in d5w], meloxicam, soy allergy, and sulfa antibiotics.  PFSH  Drug: Ms. Pareja  reports no history of drug use. Alcohol:  reports no history of alcohol use. Tobacco:  reports that she has quit smoking. Her smoking use included cigarettes. She has never used smokeless tobacco. Medical:  has a past medical history of Aortic stenosis due to bicuspid aortic valve, Atrial fibrillation with RVR ([place]) (01/11/2015), Atrial flutter ([place]), Basal skull fracture ([name]) (20 yrs ago), CHF (congestive heart failure) ([place]), Chronic respiratory failure ([place]), COPD (chronic obstructive pulmonary disease) ([place]), Deafness in left ear, Essential hypertension (01/24/2015), History of cardiac cath, History of stress test, HLD (hyperlipidemia), HTN (hypertension), Hypothyroidism, Obesity, PAF (paroxysmal atrial fibrillation) ([place]), Paroxysmal atrial fibrillation ([place]) (01/24/2015), Right upper quadrant abdominal tenderness without rebound tenderness (03/02/2018), and S/P ascending aortic aneurysm repair (2008). Surgical: Ms. Kreischer  has a past surgical history that includes Abdominal aortic aneurysm repair (2008); Aortic valve replacement (2008); Abdominal hysterectomy; Carpal tunnel release;  Tumor excision (Left); Cardiac catheterization; Cardiac catheterization (N/A, 08/17/2016); CARDIOVERSION (N/A, 08/15/2018); CARDIOVERSION (N/A, 11/14/2018); Cardioversion (N/A, 06/05/2019); Cardioversion (N/A, 08/14/2019); Cardioversion (N/A, 11/09/2019); and Cardioversion (N/A, 01/17/2020). Family: family history includes Stroke in her father and paternal grandmother.  Constitutional Exam  General appearance: Well nourished, well developed, and well hydrated. In no apparent acute distress Vitals:   06/18/21 1309  BP: (!) 174/92  Pulse: 78  Resp: 20  Temp: (!) 97.2 F (36.2 C)  SpO2: 97%  Weight: 181 lb (82.1 kg)  Height: 4' 10"  (1.473 m)   BMI Assessment: Estimated body mass index is 37.83 kg/m as calculated from the following:   Height as of this encounter: 4' 10"  (1.473 m).   Weight as of this encounter: 181 lb (82.1 kg).  BMI interpretation table: BMI level Category Range association with higher incidence of chronic pain  <18 kg/m2 Underweight   18.5-24.9 kg/m2 Ideal body weight   25-29.9 kg/m2 Overweight Increased incidence by 20%  30-34.9 kg/m2 Obese (Class I) Increased incidence by 68%  35-39.9 kg/m2 Severe obesity (Class II) Increased incidence by 136%  >40  kg/m2 Extreme obesity (Class III) Increased incidence by 254%   Patient's current BMI Ideal Body weight  Body mass index is 37.83 kg/m. Patient must be at least 60 in tall to calculate ideal body weight   BMI Readings from Last 4 Encounters:  06/18/21 37.83 kg/m  05/22/21 37.62 kg/m  05/13/21 38.04 kg/m  04/30/21 38.87 kg/m   Wt Readings from Last 4 Encounters:  06/18/21 181 lb (82.1 kg)  05/22/21 180 lb (81.6 kg)  05/13/21 182 lb (82.6 kg)  04/30/21 186 lb (84.4 kg)    Psych/Mental status: Alert, oriented x 3 (person, place, & time)       Eyes: PERLA Respiratory: No evidence of acute respiratory distress  Assessment & Plan  Primary Diagnosis & Pertinent Problem List: The primary encounter diagnosis was  Chronic shoulder pain (1ry area of Pain) (Bilateral) (L>R). Diagnoses of Chronic upper extremity pain (2ry area of Pain) (Bilateral) (L>R), Chronic neck pain (3ry area of Pain) (Posterior) (Bilateral) (L>R), Shoulder blade pain (4th area of Pain) (Left), Chronic intractable headache, unspecified headache type, Osteoarthritis of acromioclavicular joints (Bilateral), Chronic pain syndrome, Encounter for chronic pain management, and Chronic anticoagulation (Eliquis) were also pertinent to this visit.  Visit Diagnosis: 1. Chronic shoulder pain (1ry area of Pain) (Bilateral) (L>R)   2. Chronic upper extremity pain (2ry area of Pain) (Bilateral) (L>R)   3. Chronic neck pain (3ry area of Pain) (Posterior) (Bilateral) (L>R)   4. Shoulder blade pain (4th area of Pain) (Left)   5. Chronic intractable headache, unspecified headache type   6. Osteoarthritis of acromioclavicular joints (Bilateral)   7. Chronic pain syndrome   8. Encounter for chronic pain management   9. Chronic anticoagulation (Eliquis)    Problems updated and reviewed during this visit: No problems updated.  Plan of Care  Pharmacotherapy (Medications Ordered): No orders of the defined types were placed in this encounter.  Procedure Orders         SUPRASCAPULAR NERVE BLOCK     Lab Orders  No laboratory test(s) ordered today   Imaging Orders  No imaging studies ordered today   Referral Orders  No referral(s) requested today    Pharmacological management options:  Opioid Analgesics: I will not be prescribing any opioids at this time Membrane stabilizer: I will not be prescribing any at this time Muscle relaxant: I will not be prescribing any at this time NSAID: I will not be prescribing any at this time Other analgesic(s): I will not be prescribing any at this time     Interventional Therapies  Risk  Complexity Considerations:   Estimated body mass index is 38.87 kg/m as calculated from the following:   Height as of  this encounter: 4' 10"  (1.473 m).   Weight as of this encounter: 186 lb (84.4 kg). Eliquis Anticoagulation: (Stop: 3 days  Restart: 6 hours)   Planned  Pending:   Pending further evaluation   Under consideration:   None at this point   Completed:   None at this time   Therapeutic  Palliative (PRN) options:   None established    Provider-requested follow-up: Return for (Clinic) procedure: (B) SSNB #1, (NS), (Blood Thinner Protocol). Recent Visits Date Type Provider Dept  04/30/21 Office Visit Milinda Pointer, MD [facility]  Showing recent visits within past 90 days and meeting all other requirements Today's Visits Date Type Provider Dept  06/18/21 Office Visit Milinda Pointer, MD [facility]  Showing today's visits and meeting all other requirements Future  Appointments No visits were found meeting these conditions. Showing future appointments within next 90 days and meeting all other requirements Primary Care Physician: [name], MD Note by: Gaspar Cola, MD Date: 06/18/2021; Time: 2:10 PM

## 2021-06-18 ENCOUNTER — Telehealth: Payer: Self-pay | Admitting: *Deleted

## 2021-06-18 ENCOUNTER — Other Ambulatory Visit: Payer: Self-pay

## 2021-06-18 ENCOUNTER — Ambulatory Visit: Payer: Medicare Other | Attending: Pain Medicine | Admitting: Pain Medicine

## 2021-06-18 VITALS — BP 174/92 | HR 78 | Temp 97.2°F | Resp 20 | Ht <= 58 in | Wt 181.0 lb

## 2021-06-18 DIAGNOSIS — G8929 Other chronic pain: Secondary | ICD-10-CM | POA: Diagnosis not present

## 2021-06-18 DIAGNOSIS — G894 Chronic pain syndrome: Secondary | ICD-10-CM | POA: Insufficient documentation

## 2021-06-18 DIAGNOSIS — M19012 Primary osteoarthritis, left shoulder: Secondary | ICD-10-CM

## 2021-06-18 DIAGNOSIS — M898X1 Other specified disorders of bone, shoulder: Secondary | ICD-10-CM | POA: Diagnosis not present

## 2021-06-18 DIAGNOSIS — M542 Cervicalgia: Secondary | ICD-10-CM

## 2021-06-18 DIAGNOSIS — M79602 Pain in left arm: Secondary | ICD-10-CM | POA: Insufficient documentation

## 2021-06-18 DIAGNOSIS — Z7901 Long term (current) use of anticoagulants: Secondary | ICD-10-CM | POA: Diagnosis not present

## 2021-06-18 DIAGNOSIS — M25512 Pain in left shoulder: Secondary | ICD-10-CM | POA: Insufficient documentation

## 2021-06-18 DIAGNOSIS — M79601 Pain in right arm: Secondary | ICD-10-CM | POA: Diagnosis not present

## 2021-06-18 DIAGNOSIS — R519 Headache, unspecified: Secondary | ICD-10-CM | POA: Diagnosis not present

## 2021-06-18 DIAGNOSIS — M19011 Primary osteoarthritis, right shoulder: Secondary | ICD-10-CM | POA: Insufficient documentation

## 2021-06-18 DIAGNOSIS — M25511 Pain in right shoulder: Secondary | ICD-10-CM | POA: Diagnosis not present

## 2021-06-18 NOTE — Patient Instructions (Signed)

## 2021-06-18 NOTE — Progress Notes (Signed)
Safety precautions to be maintained throughout the outpatient stay will include: orient to surroundings, keep bed in low position, maintain call bell within reach at all times, provide assistance with transfer out of bed and ambulation.  

## 2021-06-18 NOTE — Telephone Encounter (Signed)
[name] asking for Medical Clearance to stop Eliquis prior to procedure.  Patient desires to have bilateral supra scapular nerve block d/t shoulder pain.

## 2021-06-18 NOTE — Telephone Encounter (Signed)
Patient called in to let us know that she takes antibiotics prior to dental work d/t a valve replacement that was done approx 14 years ago.  I told her I would check with FN to see if this is necessary.    FN states that for safety reasons we can administer antibiotics the day of procedure via IV.    Called patient back to let her know that we will give antibiotics and to please remind [place] that day.

## 2021-06-20 ENCOUNTER — Other Ambulatory Visit: Payer: Medicare Other

## 2021-06-20 ENCOUNTER — Ambulatory Visit: Payer: Medicare Other | Admitting: Internal Medicine

## 2021-06-21 NOTE — Progress Notes (Signed)
Patient ID: [name], female    DOB: 08/22/1946, 75 y.o.   MRN: [id number]  HPI  Ms Alia is a 75 y/o female with a history of hyperlipidemia, HTN, thyroid disease, COPD, left ear deafness, atrial fibrillation, previous tobacco use and chronic heart failure.   Echo report from 05/16/21 reviewed and showed an EF of 45% along with mild LVH. Echo report from 02/07/20 reviewed and showed an EF of >55% along with severe LAE. Catheterization done 02/07/20 but unable to view the results.   Was in the ED 03/17/21 due to shortness of breath where she was evaluated and released.   She presents today for a follow-up visit with a chief complaint of moderate fatigue upon minimal exertion. She describes this as chronic in nature having been present for several years. She has associated cough, shortness of breath, abdominal pain (with gallbladder), light-headedness, depression and difficulty sleeping along with this. She denies any abdominal distention, palpitations, pedal edema, chest pain, wheezing or weight gain.   Says that she has a call in to cardiology about her lisinopril and her BP still running high.   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 s/p bioprosthetic valve replacement [facility];  b. 01/2015 Echo: EF 60-65%, no rwma, Gr1 DD, mildly dil LA, nl RV fxn.   Atrial fibrillation with RVR ([place]) 01/11/2015   Atrial flutter ([place])    a. 08/2016 s/p DCCV.  Remains on flecainide 50 mg bid.   Basal skull fracture ([name]) 20 yrs ago   CHF (congestive heart failure) ([name])    Chronic respiratory failure ([name])    COPD (chronic obstructive pulmonary disease) ([place])    a. 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 2008 prior to Aortic aneurysm repair-->nl cors.   History of stress test    a. 10/2015 MV: no ischemia/infarct.   HLD  (hyperlipidemia)    HTN (hypertension)    Hypothyroidism    Obesity    PAF (paroxysmal atrial fibrillation) ([name])    a. on Eliquis; b. CHADS2VASc = 3 (HTN, age x 76, female).   Paroxysmal atrial fibrillation ([place]) 01/24/2015   Right upper quadrant abdominal tenderness without rebound tenderness 03/02/2018   S/P ascending aortic aneurysm repair 2008   Past Surgical History:  Procedure Laterality Date   ABDOMINAL AORTIC ANEURYSM REPAIR  2008   ABDOMINAL HYSTERECTOMY     AORTIC VALVE REPLACEMENT  2008   CARDIAC CATHETERIZATION     [facility]   CARDIOVERSION N/A 08/15/2018   Procedure: CARDIOVERSION;  Surgeon: [name], MD;  Location: [facility] ORS;  Service: Cardiovascular;  Laterality: N/A;   CARDIOVERSION N/A 11/14/2018   Procedure: CARDIOVERSION (CATH LAB);  Surgeon: [name], MD;  Location: [facility] OR[facility];  Service: Cardiovascular;  Laterality: N/A;   CARDIOVERSION N/A 06/05/2019   Procedure: CARDIOVERSION;  Surgeon: [name], MD;  Location: [place] [facility];  Service: Cardiovascular;  Laterality: N/A;   CARDIOVERSION N/A 08/14/2019   Procedure: CARDIOVERSION;  Surgeon: [name], MD;  Location: [place] [facility];  Service: Cardiovascular;  Laterality: N/A;   CARDIOVERSION N/A 11/09/2019   Procedure: CARDIOVERSION;  Surgeon: Isaias Cowman, MD;  Location: [facility];  Service: Cardiovascular;  Laterality: N/A;   CARDIOVERSION N/A 01/17/2020   Procedure: CARDIOVERSION;  Surgeon: Isaias Cowman, MD;  Location: [facility];  Service: Cardiovascular;  Laterality: N/A;   CARPAL TUNNEL RELEASE     ELECTROPHYSIOLOGIC STUDY N/A 08/17/2016   Procedure: Cardioversion;  Surgeon: [name], MD;  Location: [facility];  Service: Cardiovascular;  Laterality: N/A;   TUMOR EXCISION Left    x3 (arm)   Family History  Problem Relation Age of Onset   Stroke Father    Stroke Paternal Grandmother    Social History   Tobacco Use   Smoking status: Former    Types: Cigarettes   Smokeless  tobacco: Never  Substance Use Topics   Alcohol use: No   Allergies  Allergen Reactions   Benadryl [Diphenhydramine Hcl (Sleep)] Palpitations   Cetirizine Palpitations   Lasix [Furosemide] Rash   Levaquin [Levofloxacin In D5w] Other (See Comments)    Reaction:  Fatigue and muscle soreness   Meloxicam Rash   Soy Allergy Hives and Nausea And Vomiting   Sulfa Antibiotics Rash   Prior to Admission medications   Medication Sig Start Date End Date Taking? Authorizing Provider  ALPRAZolam (XANAX) 0.25 MG tablet Take 1 tablet (0.25 mg total) by mouth 2 (two) times daily as needed for anxiety. 04/21/21  Yes [name], MD  atorvastatin (LIPITOR) 40 MG tablet 1 tab po qhs 03/06/21  Yes Abernathy, [name], NP  bumetanide (BUMEX) 1 MG tablet Take 2 tablets (2 mg total) by mouth 2 (two) times daily. 08/20/20 03/14/22 Yes Ralene Gasparyan, Otila Kluver A, FNP  Calcium Carbonate-Vitamin D (CALCIUM 600+D PO) Take 1 tablet by mouth daily.   Yes [provider]  carvedilol (COREG) 3.125 MG tablet Take 3.125 mg by mouth 2 (two) times daily with a meal.   Yes [provider]  Coenzyme Q10 (COQ10) 200 MG CAPS Take 200 mg by mouth daily.   Yes [provider]  diclofenac Sodium (VOLTAREN) 1 % GEL SMARTSIG:Gram(s) Topical Twice Daily 09/09/20  Yes [provider]  ELIQUIS 5 MG TABS tablet Take 1 tablet by mouth twice daily. Patient taking differently: Take 5 mg by mouth 2 (two) times daily. 10/26/19  Yes [name], MD  ferrous sulfate 325 (65 FE) MG tablet Take 2 tablets (650 mg total) by mouth 2 (two) times daily with a meal. Patient taking differently: Take 325 mg by mouth. Takes 4 tablets by mouth twice a day 01/30/21  Yes Jennye Boroughs, MD  flecainide (TAMBOCOR) 50 MG tablet Take 1 tablet (50 mg total) by mouth 2 (two) times daily. PLEASE CONTACT OFFICE [phone] TO SCHEDULE APPOINTMENT FOR FUTURE REFILLS 02/23/20  Yes Deboraha Sprang, MD  levothyroxine (SYNTHROID) 25 MCG tablet Take  1 tablet by mouth daily before breakfast. 06/22/21  Yes [name], MD  lisinopril (ZESTRIL) 40 MG tablet Take 1 tablet (40 mg total) by mouth daily. 10/07/20 03/14/22 Yes Myrth Dahan A, FNP  loratadine (CLARITIN) 10 MG tablet Take 10 mg by mouth daily.   Yes [provider]  oxyCODONE-acetaminophen (PERCOCET) 7.5-325 MG tablet Take one tab in am and half in pm for severe shoulder pain 06/02/21  Yes Abernathy, [name], NP  pantoprazole (PROTONIX) 40 MG tablet Take 1 tablet by mouth daily. 06/22/21  Yes [name], MD  potassium chloride (KLOR-CON) 10 MEQ tablet Take 4 tablets (40 mEq total) by mouth 2 (two) times daily. 11/28/20  Yes [name], MD  sertraline (ZOLOFT) 50 MG tablet Take 1 tablet (50 mg total) by mouth daily. 02/10/21  Yes [name],  MD  traZODone (DESYREL) 50 MG tablet Take 1 tablet by mouth at bedtime. 05/23/21  Yes [name], MD  TRELEGY ELLIPTA 100-62.5-25 MCG/INH AEPB INHALE 1 PUFF INTO THE LUNGS DAILY 06/11/21  Yes [name], MD  VENTOLIN HFA 108 (90 Base) MCG/ACT inhaler INHALE 2 PUFFS INTO THE LUNGS EVERY 6 HOURS AS NEEDED FOR WHEEZING OR SHORTNESS OF BREATH 02/17/21  Yes Abernathy, Yetta Flock, NP   Review of Systems  Constitutional:  Positive for appetite change (decreased) and fatigue (tire easily).  HENT:  Positive for hearing loss. Negative for congestion and sore throat.   Eyes: Negative.   Respiratory:  Positive for cough and shortness of breath. Negative for wheezing.   Cardiovascular:  Negative for chest pain, palpitations and leg swelling.  Gastrointestinal:  Positive for abdominal pain and nausea. Negative for abdominal distention.  Endocrine: Negative.   Genitourinary: Negative.   Musculoskeletal:  Positive for arthralgias (bilateral shoulder pain). Negative for back pain.  Skin: Negative.   Allergic/Immunologic: Negative.   Neurological:  Positive for light-headedness (at times). Negative for dizziness.  Hematological:  Negative for  adenopathy. Does not bruise/bleed easily.  Psychiatric/Behavioral:  Positive for dysphoric mood and sleep disturbance (trouble sleeping due to shoulder pain; wearing cpap at bedtime). The patient is not nervous/anxious.    Vitals:   06/23/21 1403  BP: (!) 144/70  Pulse: 71  Resp: 16  SpO2: 91%  Weight: 180 lb 2 oz (81.7 kg)  Height: 4\' 10"  (1.473 m)   Wt Readings from Last 3 Encounters:  06/23/21 180 lb 2 oz (81.7 kg)  06/18/21 181 lb (82.1 kg)  05/22/21 180 lb (81.6 kg)   Lab Results  Component Value Date   CREATININE 0.80 04/30/2021   CREATININE 0.93 03/17/2021   CREATININE 0.81 03/14/2021   Physical Exam Vitals and nursing note reviewed.  Constitutional:      Appearance: Normal appearance.  HENT:     Head: Normocephalic and atraumatic.     Left Ear: Decreased hearing noted.  Cardiovascular:     Rate and Rhythm: Normal rate and regular rhythm.  Pulmonary:     Effort: Pulmonary effort is normal. No respiratory distress.     Breath sounds: No wheezing or rales.  Abdominal:     General: There is no distension.     Palpations: Abdomen is soft.  Musculoskeletal:     Cervical back: Normal range of motion and neck supple.     Right lower leg: No edema.     Left lower leg: No edema.  Skin:    General: Skin is warm and dry.  Neurological:     General: No focal deficit present.     Mental Status: She is alert and oriented to person, place, and time.  Psychiatric:        Mood and Affect: Mood normal.        Behavior: Behavior normal.        Thought Content: Thought content normal.   Assessment & Plan:  1: Chronic heart failure with mildly reduced ejection fraction- - NYHA class III - euvolemic today - weighing daily; reminded to call for an overnight weight gain of >2 pounds or a weekly weight gain of >5 pounds - weight stable from last visit here 1 month ago - not adding salt and is using pepper for seasoning; reviewed the importance of closely following a low  sodium diet  - saw cardiothoracic provider Ysidro Evert) 09/04/20 for aortic valve - on GDMT of lisinopril &  carvedilol - discussed changing her lisinopril to entresto; she says that she would like to wait until she hears back from cardiology; went ahead and gave her a 30 day voucher as well as filled out [employer] patient assistance for her - she says that she will call back after she hears from cardiology; if she's ready, will go ahead and send in RX for entresto at that time - has had previous valve surgery  - BNP 03/17/21 was 267.5  2: HTN- - BP mildly elevated today (144/70) - saw PCP Stephanie Coup) 04/29/21 - BMP 04/30/21 reviewed and showed sodium 139, potassium 3.9, creatinine 0.8 and GFR 78  3: severe COPD- - wearing oxygen at 3.5L around the clock at home; when she comes to appointments, she has to put her portable tank on 2L or it'll run out - saw pulmonology Humphrey Rolls) 05/13/21  4: Atrial fibrillation-  - dual chamber pacemaker placed June 2021 - has had multiple cardioversions - saw cardiology ([place]) 05/19/21   Medication list was reviewed.   Return in 1 month or sooner for any questions/problems before then.

## 2021-06-22 ENCOUNTER — Other Ambulatory Visit: Payer: Self-pay | Admitting: Internal Medicine

## 2021-06-23 ENCOUNTER — Encounter: Payer: Self-pay | Admitting: Family

## 2021-06-23 ENCOUNTER — Ambulatory Visit: Payer: Medicare Other | Attending: Family | Admitting: Family

## 2021-06-23 ENCOUNTER — Other Ambulatory Visit: Payer: Self-pay

## 2021-06-23 VITALS — BP 144/70 | HR 71 | Resp 16 | Ht <= 58 in | Wt 180.1 lb

## 2021-06-23 DIAGNOSIS — I509 Heart failure, unspecified: Secondary | ICD-10-CM | POA: Insufficient documentation

## 2021-06-23 DIAGNOSIS — I480 Paroxysmal atrial fibrillation: Secondary | ICD-10-CM | POA: Insufficient documentation

## 2021-06-23 DIAGNOSIS — R109 Unspecified abdominal pain: Secondary | ICD-10-CM | POA: Diagnosis not present

## 2021-06-23 DIAGNOSIS — Z9981 Dependence on supplemental oxygen: Secondary | ICD-10-CM | POA: Diagnosis not present

## 2021-06-23 DIAGNOSIS — G479 Sleep disorder, unspecified: Secondary | ICD-10-CM | POA: Diagnosis not present

## 2021-06-23 DIAGNOSIS — F32A Depression, unspecified: Secondary | ICD-10-CM | POA: Diagnosis not present

## 2021-06-23 DIAGNOSIS — Z791 Long term (current) use of non-steroidal anti-inflammatories (NSAID): Secondary | ICD-10-CM | POA: Insufficient documentation

## 2021-06-23 DIAGNOSIS — Z952 Presence of prosthetic heart valve: Secondary | ICD-10-CM | POA: Insufficient documentation

## 2021-06-23 DIAGNOSIS — Z79891 Long term (current) use of opiate analgesic: Secondary | ICD-10-CM | POA: Diagnosis not present

## 2021-06-23 DIAGNOSIS — J449 Chronic obstructive pulmonary disease, unspecified: Secondary | ICD-10-CM | POA: Insufficient documentation

## 2021-06-23 DIAGNOSIS — Z888 Allergy status to other drugs, medicaments and biological substances status: Secondary | ICD-10-CM | POA: Insufficient documentation

## 2021-06-23 DIAGNOSIS — Z950 Presence of cardiac pacemaker: Secondary | ICD-10-CM | POA: Insufficient documentation

## 2021-06-23 DIAGNOSIS — I5022 Chronic systolic (congestive) heart failure: Secondary | ICD-10-CM | POA: Diagnosis not present

## 2021-06-23 DIAGNOSIS — Z79890 Hormone replacement therapy (postmenopausal): Secondary | ICD-10-CM | POA: Diagnosis not present

## 2021-06-23 DIAGNOSIS — R42 Dizziness and giddiness: Secondary | ICD-10-CM | POA: Insufficient documentation

## 2021-06-23 DIAGNOSIS — Z79899 Other long term (current) drug therapy: Secondary | ICD-10-CM | POA: Diagnosis not present

## 2021-06-23 DIAGNOSIS — E785 Hyperlipidemia, unspecified: Secondary | ICD-10-CM | POA: Insufficient documentation

## 2021-06-23 DIAGNOSIS — I110 Hypertensive heart disease with heart failure: Secondary | ICD-10-CM | POA: Insufficient documentation

## 2021-06-23 DIAGNOSIS — E039 Hypothyroidism, unspecified: Secondary | ICD-10-CM | POA: Insufficient documentation

## 2021-06-23 DIAGNOSIS — I10 Essential (primary) hypertension: Secondary | ICD-10-CM | POA: Diagnosis not present

## 2021-06-23 DIAGNOSIS — Z7901 Long term (current) use of anticoagulants: Secondary | ICD-10-CM | POA: Insufficient documentation

## 2021-06-23 DIAGNOSIS — H9193 Unspecified hearing loss, bilateral: Secondary | ICD-10-CM | POA: Insufficient documentation

## 2021-06-23 DIAGNOSIS — Z87891 Personal history of nicotine dependence: Secondary | ICD-10-CM | POA: Insufficient documentation

## 2021-06-23 DIAGNOSIS — Z882 Allergy status to sulfonamides status: Secondary | ICD-10-CM | POA: Diagnosis not present

## 2021-06-23 DIAGNOSIS — Z881 Allergy status to other antibiotic agents status: Secondary | ICD-10-CM | POA: Insufficient documentation

## 2021-06-23 NOTE — Patient Instructions (Addendum)
Continue weighing daily and call for an overnight weight gain of > 2 pounds or a weekly weight gain of >5 pounds.    Call us back after you hear from Dr. Saralyn Pilar office

## 2021-06-26 ENCOUNTER — Telehealth: Payer: Self-pay | Admitting: *Deleted

## 2021-06-26 NOTE — Telephone Encounter (Addendum)
Patient coming in for procedure on Tuesday. Had aortic valve replacement in 2008 takes ampicillin 500mg  x4 one hour before any procedure. Asking if she should have antibiotic prior to her procedure here. Uses [facility] on [address].  Spoke to Dr. Dossie Arbour and called patient to explain she will get the antibiotic once

## 2021-06-26 NOTE — Telephone Encounter (Deleted)
Spoke to Dr. Dossie Arbour and called patient to explain she will get the antibiotic once admitted.

## 2021-06-27 ENCOUNTER — Other Ambulatory Visit: Payer: Self-pay

## 2021-06-27 ENCOUNTER — Inpatient Hospital Stay: Payer: Medicare Other | Attending: Internal Medicine

## 2021-06-27 ENCOUNTER — Inpatient Hospital Stay: Payer: Medicare Other | Admitting: Internal Medicine

## 2021-06-27 DIAGNOSIS — D649 Anemia, unspecified: Secondary | ICD-10-CM | POA: Insufficient documentation

## 2021-06-27 DIAGNOSIS — I509 Heart failure, unspecified: Secondary | ICD-10-CM | POA: Insufficient documentation

## 2021-06-27 DIAGNOSIS — Z87891 Personal history of nicotine dependence: Secondary | ICD-10-CM | POA: Diagnosis not present

## 2021-06-27 DIAGNOSIS — E039 Hypothyroidism, unspecified: Secondary | ICD-10-CM | POA: Insufficient documentation

## 2021-06-27 DIAGNOSIS — Z79899 Other long term (current) drug therapy: Secondary | ICD-10-CM | POA: Insufficient documentation

## 2021-06-27 DIAGNOSIS — I110 Hypertensive heart disease with heart failure: Secondary | ICD-10-CM | POA: Insufficient documentation

## 2021-06-27 DIAGNOSIS — I480 Paroxysmal atrial fibrillation: Secondary | ICD-10-CM | POA: Insufficient documentation

## 2021-06-27 DIAGNOSIS — G4733 Obstructive sleep apnea (adult) (pediatric): Secondary | ICD-10-CM | POA: Diagnosis not present

## 2021-06-27 LAB — COMPREHENSIVE METABOLIC PANEL
ALT: 16 U/L (ref 0–44)
AST: 18 U/L (ref 15–41)
Albumin: 3.8 g/dL (ref 3.5–5.0)
Alkaline Phosphatase: 79 U/L (ref 38–126)
Anion gap: 8 (ref 5–15)
BUN: 20 mg/dL (ref 8–23)
CO2: 29 mmol/L (ref 22–32)
Calcium: 8.7 mg/dL — ABNORMAL LOW (ref 8.9–10.3)
Chloride: 99 mmol/L (ref 98–111)
Creatinine, Ser: 0.98 mg/dL (ref 0.44–1.00)
GFR, Estimated: >60 mL/min (ref >60)
Glucose, Bld: 106 mg/dL — ABNORMAL HIGH (ref 70–99)
Potassium: 3.6 mmol/L (ref 3.5–5.1)
Sodium: 136 mmol/L (ref 135–145)
Total Bilirubin: 0.5 mg/dL (ref 0.3–1.2)
Total Protein: 6.9 g/dL (ref 6.5–8.1)

## 2021-06-27 LAB — CBC WITH DIFFERENTIAL/PLATELET
Abs Immature Granulocytes: 0.05 10*3/uL (ref 0.00–0.07)
Basophils Absolute: 0 10*3/uL (ref 0.0–0.1)
Basophils Relative: 1 %
Eosinophils Absolute: 0.1 10*3/uL (ref 0.0–0.5)
Eosinophils Relative: 1 %
HCT: 38 % (ref 36.0–46.0)
Hemoglobin: 12 g/dL (ref 12.0–15.0)
Immature Granulocytes: 1 %
Lymphocytes Relative: 15 %
Lymphs Abs: 0.9 10*3/uL (ref 0.7–4.0)
MCH: 28.6 pg (ref 26.0–34.0)
MCHC: 31.6 g/dL (ref 30.0–36.0)
MCV: 90.7 fL (ref 80.0–100.0)
Monocytes Absolute: 0.7 10*3/uL (ref 0.1–1.0)
Monocytes Relative: 12 %
Neutro Abs: 4.6 10*3/uL (ref 1.7–7.7)
Neutrophils Relative %: 70 %
Platelets: 243 10*3/uL (ref 150–400)
RBC: 4.19 MIL/uL (ref 3.87–5.11)
RDW: 14.8 % (ref 11.5–15.5)
WBC: 6.4 10*3/uL (ref 4.0–10.5)
nRBC: 0 % (ref 0.0–0.2)

## 2021-06-27 LAB — LACTATE DEHYDROGENASE: LDH: 133 U/L (ref 98–192)

## 2021-06-27 LAB — RETICULOCYTES
Immature Retic Fract: 12 % (ref 2.3–15.9)
RBC.: 4.13 MIL/uL (ref 3.87–5.11)
Retic Count, Absolute: 63.6 10*3/uL (ref 19.0–186.0)
Retic Ct Pct: 1.5 % (ref 0.4–3.1)

## 2021-06-27 NOTE — Progress Notes (Signed)
[place] NOTE  Patient Care Team: [name], MD as PCP - General (Internal Medicine) [name], MD as PCP - Cardiology (Cardiology) Edythe Clarity, [facility] as Pharmacist (Pharmacist)  CHIEF COMPLAINTS/PURPOSE OF CONSULTATION: Anemia  #Chronic intermittent anemia since 2017-mild; since 2022-hemoglobin 8-9; MCV 100s; July 2694-WNIO normal; folic acid E70/JJK/KXFGHWEXHBZ/JIR-CVELFY normal limits; iron saturation 32% ferritin 42.  TSH normal;  elevated reticulocyte count/elevated erythropoietin level.  Pathologist review of smear-no schistocytes or spherocytes/hemolysis  # COPD [Dr.Saadat Khan/ CHF- Dr.Parashoes]; 2008 [bioprosthetic valve aortic] Oncology History   No history exists.     HISTORY OF PRESENTING ILLNESS: Alone. Ambulating independently. -nasal cannula oxygen 3.5 L. [name] All 75 y.o.  female patient with multiple medical problems including aortic stenosis bioprosthetic valve; A. fib on  eliquis; chronic respiratory failure [CHF/COPD]-on 3.5 L of oxygen; with morbid obesity is here for follow-up of anemia.  Patient states that [name] has been taking 8 iron pills a day.  Chronic mild fatigue.  Denies any chest pain.  Complains of shortness of breath on exertion.  No blood in stools or black-colored stools.  Patient awaiting left shoulder steroidal injection.   Review of Systems  Constitutional:  Positive for malaise/fatigue. Negative for chills, diaphoresis, fever and weight loss.  HENT:  Negative for nosebleeds and sore throat.   Eyes:  Negative for double vision.  Respiratory:  Positive for cough and shortness of breath. Negative for hemoptysis, sputum production and wheezing.   Cardiovascular:  Negative for chest pain, palpitations, orthopnea and leg swelling.  Gastrointestinal:  Negative for abdominal pain, blood in stool, constipation, diarrhea, heartburn, melena, nausea and vomiting.  Genitourinary:  Negative for dysuria, frequency  and urgency.  Musculoskeletal:  Positive for joint pain. Negative for back pain.  Skin: Negative.  Negative for itching and rash.  Neurological:  Negative for dizziness, tingling, focal weakness, weakness and headaches.  Endo/Heme/Allergies:  Does not bruise/bleed easily.  Psychiatric/Behavioral:  Negative for depression. The patient is not nervous/anxious and does not have insomnia.     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 s/p bioprosthetic valve replacement 2008 at [facility];  b. 01/2015 Echo: EF 60-65%, no rwma, Gr1 DD, mildly dil LA, nl RV fxn.   Atrial fibrillation with RVR ([place]) 01/11/2015   Atrial flutter ([place])    a. 08/2016 s/p DCCV.  Remains on flecainide 50 mg bid.   Basal skull fracture ([name]) 20 yrs ago   CHF (congestive heart failure) ([name])    Chronic respiratory failure ([name])    COPD (chronic obstructive pulmonary disease) ([place])    a. 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 2008 prior to Aortic aneurysm repair-->nl cors.   History of stress test    a. 10/2015 MV: no ischemia/infarct.   HLD (hyperlipidemia)    HTN (hypertension)    Hypothyroidism    Obesity    PAF (paroxysmal atrial fibrillation) ([name])    a. on Eliquis; b. CHADS2VASc = 3 (HTN, age x 17, female).   Paroxysmal atrial fibrillation ([place]) 01/24/2015   Right upper quadrant abdominal tenderness without rebound tenderness 03/02/2018   S/P ascending aortic aneurysm repair 2008    SURGICAL HISTORY: Past Surgical History:  Procedure Laterality Date  ABDOMINAL AORTIC ANEURYSM REPAIR  2008   ABDOMINAL HYSTERECTOMY     AORTIC VALVE REPLACEMENT  2008   CARDIAC CATHETERIZATION     [facility]   CARDIOVERSION N/A 08/15/2018   Procedure: CARDIOVERSION;  Surgeon: [name], MD;  Location: [facility] ORS;  Service: Cardiovascular;  Laterality:  N/A;   CARDIOVERSION N/A 11/14/2018   Procedure: CARDIOVERSION (CATH LAB);  Surgeon: [name], MD;  Location: [facility] ORS;  Service: Cardiovascular;  Laterality: N/A;   CARDIOVERSION N/A 06/05/2019   Procedure: CARDIOVERSION;  Surgeon: [name], MD;  Location: [facility] ORS;  Service: Cardiovascular;  Laterality: N/A;   CARDIOVERSION N/A 08/14/2019   Procedure: CARDIOVERSION;  Surgeon: [name], MD;  Location: [facility] ORS;  Service: Cardiovascular;  Laterality: N/A;   CARDIOVERSION N/A 11/09/2019   Procedure: CARDIOVERSION;  Surgeon: Isaias Cowman, MD;  Location: [place] [facility];  Service: Cardiovascular;  Laterality: N/A;   CARDIOVERSION N/A 01/17/2020   Procedure: CARDIOVERSION;  Surgeon: Isaias Cowman, MD;  Location: [facility] ORS;  Service: Cardiovascular;  Laterality: N/A;   CARPAL TUNNEL RELEASE     ELECTROPHYSIOLOGIC STUDY N/A 08/17/2016   Procedure: Cardioversion;  Surgeon: [name], MD;  Location: [facility] ORS;  Service: Cardiovascular;  Laterality: N/A;   TUMOR EXCISION Left    x3 (arm)    SOCIAL HISTORY: Social History   Socioeconomic History   Marital status: Single    Spouse name: Not on file   Number of children: Not on file   Years of education: Not on file   Highest education level: Not on file  Occupational History   Not on file  Tobacco Use   Smoking status: Former    Types: Cigarettes   Smokeless tobacco: Never  Vaping Use   Vaping Use: Never used  Substance and Sexual Activity   Alcohol use: No   Drug use: No   Sexual activity: Never    Birth control/protection: Surgical  Other Topics Concern   Not on file  Social History Narrative   Not on file   Social Determinants of Health   Financial Resource Strain: Not on file  Food Insecurity: Not on file  Transportation Needs: Not on file  Physical Activity: Not on file  Stress: Not on file  Social Connections: Not on file  Intimate Partner Violence: Not on file    FAMILY  HISTORY: Family History  Problem Relation Age of Onset   Stroke Father    Stroke Paternal Grandmother     ALLERGIES:  is allergic to benadryl [diphenhydramine hcl (sleep)], cetirizine, lasix [furosemide], levaquin [levofloxacin in d5w], meloxicam, soy allergy, and sulfa antibiotics.  MEDICATIONS:  Current Outpatient Medications  Medication Sig Dispense Refill   ALPRAZolam (XANAX) 0.25 MG tablet Take 1 tablet (0.25 mg total) by mouth 2 (two) times daily as needed for anxiety. 60 tablet 2   atorvastatin (LIPITOR) 40 MG tablet 1 tab po qhs 90 tablet 1   bumetanide (BUMEX) 1 MG tablet Take 2 tablets (2 mg total) by mouth 2 (two) times daily. 360 tablet 3   Calcium Carbonate-Vitamin D (CALCIUM 600+D PO) Take 1 tablet by mouth daily.     carvedilol (COREG) 3.125 MG tablet Take 3.125 mg by mouth 2 (two) times daily with a meal.     Coenzyme Q10 (COQ10) 200 MG CAPS Take 200 mg by mouth daily.     diclofenac Sodium (VOLTAREN) 1 % GEL SMARTSIG:Gram(s) Topical Twice Daily     ELIQUIS 5 MG TABS tablet Take 1 tablet by  mouth twice daily. (Patient taking differently: Take 5 mg by mouth 2 (two) times daily.) 180 tablet 2   ferrous sulfate 325 (65 FE) MG tablet Take 2 tablets (650 mg total) by mouth 2 (two) times daily with a meal. (Patient taking differently: Take 325 mg by mouth. Takes 4 tablets by mouth twice a day)  3   flecainide (TAMBOCOR) 50 MG tablet Take 1 tablet (50 mg total) by mouth 2 (two) times daily. PLEASE CONTACT OFFICE [phone] TO SCHEDULE APPOINTMENT FOR FUTURE REFILLS 30 tablet 0   levothyroxine (SYNTHROID) 25 MCG tablet Take 1 tablet by mouth daily before breakfast. 90 tablet 1   lisinopril (ZESTRIL) 40 MG tablet Take 1 tablet (40 mg total) by mouth daily. 90 tablet 3   loratadine (CLARITIN) 10 MG tablet Take 10 mg by mouth daily.     oxyCODONE-acetaminophen (PERCOCET) 7.5-325 MG tablet Take one tab in am and half in pm for severe shoulder pain 45 tablet 0   pantoprazole (PROTONIX)  40 MG tablet Take 1 tablet by mouth daily. 90 tablet 1   potassium chloride (KLOR-CON) 10 MEQ tablet Take 4 tablets (40 mEq total) by mouth 2 (two) times daily. 720 tablet 3   sertraline (ZOLOFT) 50 MG tablet Take 1 tablet (50 mg total) by mouth daily. 90 tablet 1   traZODone (DESYREL) 50 MG tablet Take 1 tablet by mouth at bedtime. 90 tablet 1   TRELEGY ELLIPTA 100-62.5-25 MCG/INH AEPB INHALE 1 PUFF INTO THE LUNGS DAILY 60 each 3   VENTOLIN HFA 108 (90 Base) MCG/ACT inhaler INHALE 2 PUFFS INTO THE LUNGS EVERY 6 HOURS AS NEEDED FOR WHEEZING OR SHORTNESS OF BREATH 18 g 3   No current facility-administered medications for this visit.      [name]  PHYSICAL EXAMINATION: ECOG PERFORMANCE STATUS: 1 - Symptomatic but completely ambulatory  Vitals:   06/27/21 1500  BP: 121/61  Pulse: 74  Resp: 18  Temp: (!) 97.3 F (36.3 C)  SpO2: 94%   Filed Weights   06/27/21 1500  Weight: 182 lb 3.2 oz (82.6 kg)    Physical Exam Vitals and nursing note reviewed.  HENT:     Head: Normocephalic and atraumatic.     Mouth/Throat:     Pharynx: Oropharynx is clear.  Eyes:     Extraocular Movements: Extraocular movements intact.     Pupils: Pupils are equal, round, and reactive to light.  Cardiovascular:     Rate and Rhythm: Normal rate and regular rhythm.  Pulmonary:     Comments: Decreased breath sounds bilaterally.  Abdominal:     Palpations: Abdomen is soft.  Musculoskeletal:        General: Normal range of motion.     Cervical back: Normal range of motion.  Skin:    General: Skin is warm.  Neurological:     General: No focal deficit present.     Mental Status: [name] is alert and oriented to person, place, and time.  Psychiatric:        Behavior: Behavior normal.        Judgment: Judgment normal.    LABORATORY DATA:  I have reviewed the data as listed Lab Results  Component Value Date   WBC 6.4 06/27/2021   HGB 12.0 06/27/2021   HCT 38.0 06/27/2021   MCV 90.7 06/27/2021   PLT 243  06/27/2021   Recent Labs    01/29/21 1711 01/30/21 0052 03/14/21 1532 03/17/21 1917 04/30/21 1206 06/27/21 1425  NA 136   < >  133* 136 139 136  K 4.2   < > 3.7 4.6 3.9 3.6  CL 100   < > 96* 99 98 99  CO2 26   < > 28 29  --  29  GLUCOSE 110*   < > 93 101* 92 106*  BUN 23   < > 17 18 13 20   CREATININE 0.88   < > 0.81 0.93 0.80 0.98  CALCIUM 8.5*   < > 9.0 9.3 9.0 8.7*  GFRNONAA >60   < > >60 >60  --  >60  PROT 6.0*  --  6.9  --  6.4 6.9  ALBUMIN 3.3*  --  3.7  --  4.1 3.8  AST 22  --  17  --  18 18  ALT 16  --  13  --   --  16  ALKPHOS 55  --  66  --  98 79  BILITOT 0.6  --  0.5  --  <0.2 0.5   < > = values in this interval not displayed.    RADIOGRAPHIC STUDIES: I have personally reviewed the radiological images as listed and agreed with the findings in the report. No results found.  ASSESSMENT & PLAN:   Symptomatic anemia # MACROCYTIC ANEMIA elevated reticulocyte count; without evidence of hemolysis.: Unclear etiology. nadir hemoglobin around 9; currently hemoglobin 12.  Patient currently on 8 iron pills a day.  Given the absence of any significant iron deficiency I would recommend-taking 2 pills the morning and 2 in the evening.   # COPD/CHF- s/p Valve- bioprosthetoc [2008- on eliquis] / OSA- on CPAP- 3.5 lits   # DISPOSITION: # Follow up in 4 months- MD; labs- cbc/cmp; iron studies/ferritin- LDH ; haptoglobin reticulocyte count- Dr.B   All questions were answered. The patient knows to call the clinic with any problems, questions or concerns.     Cammie Sickle, MD 06/27/2021 4:25 PM

## 2021-06-27 NOTE — Assessment & Plan Note (Signed)
#   MACROCYTIC ANEMIA elevated reticulocyte count; without evidence of hemolysis.: Unclear etiology. nadir hemoglobin around 9; currently hemoglobin 12.  Patient currently on 8 iron pills a day.  Given the absence of any significant iron deficiency I would recommend-taking 2 pills the morning and 2 in the evening.   # COPD/CHF- s/p Valve- bioprosthetoc [2008- on eliquis] / OSA- on CPAP- 3.5 lits   # DISPOSITION: # Follow up in 4 months- MD; labs- cbc/cmp; iron studies/ferritin- LDH ; haptoglobin reticulocyte count- Dr.B

## 2021-06-27 NOTE — Progress Notes (Signed)
Pt has no concerns/complaints at this time. 

## 2021-06-29 LAB — ZINC: Zinc: 82 ug/dL (ref 44–115)

## 2021-06-29 LAB — COPPER, SERUM: Copper: 116 ug/dL (ref 80–158)

## 2021-06-30 ENCOUNTER — Telehealth: Payer: Self-pay | Admitting: Pharmacist

## 2021-06-30 ENCOUNTER — Other Ambulatory Visit: Payer: Self-pay | Admitting: Family

## 2021-06-30 NOTE — Progress Notes (Signed)
Chronic Care Management Pharmacy Assistant   Name: [name]  MRN: [id number] DOB: 09-Sep-1945  [name] is an 75 y.o. year old female who presents for his initial CCM visit with the clinical pharmacist.  Reason for Encounter: Chart Prep.    Conditions to be addressed/monitored: Afib, HTN, COPD  Primary concerns for visit include: COPD  Recent office visits:  05/13/21 Dr. Humphrey Rolls For follow-up. No medication changes.  04/29/21 Dr. Humphrey Rolls For follow-up. No medication changes. 04/01/21 Dr. Humphrey Rolls For COPD. No medication changes.  03/11/21 Dr. Humphrey Rolls For hospitalization follow-up. STARTED Albuterol 108 MCG/ACT 01/20/21 Jonetta Osgood, NP. For acute visit. STARTED Psyllium 58.6% 1 packet daily, mix with water/juice.   Recent consult visits: 06/27/21 Oncology Cammie Sickle, MD. For symptomatic anemia. No medication changes.  06/23/21 Cardiology Darylene Price, Loni Muse, [place]. For chronic systolic heart failure. No medication changes.  06/18/21 Pain Clinic Milinda Pointer, MD. For neck pain. No medication changes.  9/15/222 Cardiology Darylene Price, Loni Muse, [place]. For chronic systolic heart failure. No medication changes.  05/19/21 Isaias Cowman, MD For follow-Up. Per note:carvedilol 3.125 mg twice daily 05/09/21 Orthopedic Surgeon Poggi, Smith Mince and Lattie Corns For primary osteoarthritis. No information given.   04/30/21 Pain Clinic Milinda Pointer, MD. For shoulder pain. No medication changes.  04/15/21 Cardiology  Paraschos, Sheppard Coil, MD For follow-Up. No medication changes. 04/04/21 Oncology Cammie Sickle, MD. For symptomatic anemia. No medication changes.  03/14/21 Oncology Verlon Au, NP. For new patient. No medication changes.  01/03/21 Orthopedic Surgeon Poggi, Smith Mince and Lattie Corns For primary osteoarthritis. No information given.   Hospital visits:  03/17/21 [facility] Emergency Department (3 Hours) For dyspnea on  exertion. No medication changes.  01/29/21 [facility]  (24 Hours) Jennye Boroughs, MD. For symptomatic anemia. No medication changes.  Medication History: Atorvastatin 40 mg 06/14/21 30 DS Lisinopril 40 mg 06/29/21 90 DS  Medications: Outpatient Encounter Medications as of 06/30/2021  Medication Sig Note   ALPRAZolam (XANAX) 0.25 MG tablet Take 1 tablet (0.25 mg total) by mouth 2 (two) times daily as needed for anxiety.    atorvastatin (LIPITOR) 40 MG tablet 1 tab po qhs    bumetanide (BUMEX) 1 MG tablet TAKE 2 TABLETS BY MOUTH TWICE DAILY    Calcium Carbonate-Vitamin D (CALCIUM 600+D PO) Take 1 tablet by mouth daily.    carvedilol (COREG) 3.125 MG tablet Take 3.125 mg by mouth 2 (two) times daily with a meal.    Coenzyme Q10 (COQ10) 200 MG CAPS Take 200 mg by mouth daily.    diclofenac Sodium (VOLTAREN) 1 % GEL SMARTSIG:Gram(s) Topical Twice Daily    ELIQUIS 5 MG TABS tablet Take 1 tablet by mouth twice daily. (Patient taking differently: Take 5 mg by mouth 2 (two) times daily.)    ferrous sulfate 325 (65 FE) MG tablet Take 2 tablets (650 mg total) by mouth 2 (two) times daily with a meal. (Patient taking differently: Take 325 mg by mouth. Takes 4 tablets by mouth twice a day)    flecainide (TAMBOCOR) 50 MG tablet Take 1 tablet (50 mg total) by mouth 2 (two) times daily. PLEASE CONTACT OFFICE [phone] TO SCHEDULE APPOINTMENT FOR FUTURE REFILLS    levothyroxine (SYNTHROID) 25 MCG tablet Take 1 tablet by mouth daily before breakfast.    lisinopril (ZESTRIL) 40 MG tablet Take 1 tablet (40 mg total) by mouth daily. 01/29/2021: Pt had today at 1100   loratadine (CLARITIN) 10 MG  tablet Take 10 mg by mouth daily.    oxyCODONE-acetaminophen (PERCOCET) 7.5-325 MG tablet Take one tab in am and half in pm for severe shoulder pain    pantoprazole (PROTONIX) 40 MG tablet Take 1 tablet by mouth daily.    potassium chloride (KLOR-CON) 10 MEQ tablet Take 4 tablets (40 mEq total) by  mouth 2 (two) times daily.    sertraline (ZOLOFT) 50 MG tablet Take 1 tablet (50 mg total) by mouth daily.    traZODone (DESYREL) 50 MG tablet Take 1 tablet by mouth at bedtime.    TRELEGY ELLIPTA 100-62.5-25 MCG/INH AEPB INHALE 1 PUFF INTO THE LUNGS DAILY    VENTOLIN HFA 108 (90 Base) MCG/ACT inhaler INHALE 2 PUFFS INTO THE LUNGS EVERY 6 HOURS AS NEEDED FOR WHEEZING OR SHORTNESS OF BREATH    No facility-administered encounter medications on file as of 06/30/2021.    Have you seen any other providers since your last visit? Patient stated no.  Any changes in your medications or health? Patient stated no.  Any side effects from any medications? Patient stated no.  Do you have an symptoms or problems not managed by your medications? Patient stated no.  Any concerns about your health right now? Patient stated no.  Has your provider asked that you check blood pressure, blood sugar, or follow special diet at home? Patient stated she checks her blood pressure and follow diet at home.   Do you get any type of exercise on a regular basis? Patient stated no.  Can you think of a goal you would like to reach for your health? Patient stated she wants her pain to be under control.  Do you have any problems getting your medications? Patient stated stated no.   Is there anything that you would like to discuss during the appointment? Patient stated no.   Please bring medications and supplements to appointment, patient reminded her OTP appointment on 07/02/21 at 58 am.  Stafford Courthouse, [place] Pharmacist Assistant [phone]

## 2021-06-30 NOTE — Progress Notes (Deleted)
Chronic Care Management Pharmacy Note  07/02/2021 Name:  [name] MRN:  [id number] DOB:  [birth year]  Summary: ***  Recommendations/Changes made from today's visit: ***  Plan: ***   Subjective: [name] is an 75 y.o. year old female who is a primary patient of Humphrey Rolls, Timoteo Gaul, MD.  The CCM team was consulted for assistance with disease management and care coordination needs.    Engaged with patient by telephone for initial visit in response to provider referral for pharmacy case management and/or care coordination services.   Consent to Services:  The patient was given the following information about Chronic Care Management services today, agreed to services, and gave verbal consent: 1. CCM service includes personalized support from designated clinical staff supervised by the primary care provider, including individualized plan of care and coordination with other care providers 2. 24/7 contact phone numbers for assistance for urgent and routine care needs. 3. Service will only be billed when office clinical staff spend 20 minutes or more in a month to coordinate care. 4. Only one practitioner may furnish and bill the service in a calendar month. 5.The patient may stop CCM services at any time (effective at the end of the month) by phone call to the office staff. 6. The patient will be responsible for cost sharing (co-pay) of up to 20% of the service fee (after annual deductible is met). Patient agreed to services and consent obtained.  Patient Care Team: [name], MD as PCP - General (Internal Medicine) [name], MD as PCP - Cardiology (Cardiology) Edythe Clarity, [facility] as Pharmacist (Pharmacist)  Recent office visits:  05/13/21 Dr. Humphrey Rolls For follow-up. No medication changes.  04/29/21 Dr. Humphrey Rolls For follow-up. No medication changes. 04/01/21 Dr. Humphrey Rolls For COPD. No medication changes.  03/11/21 Dr. Humphrey Rolls For hospitalization follow-up. STARTED Albuterol 108  MCG/ACT 01/20/21 Jonetta Osgood, NP. For acute visit. STARTED Psyllium 58.6% 1 packet daily, mix with water/juice.    Recent consult visits: 06/27/21 Oncology Cammie Sickle, MD. For symptomatic anemia. No medication changes.  06/23/21 Cardiology Darylene Price, Loni Muse, [place]. For chronic systolic heart failure. No medication changes.  06/18/21 Pain Clinic Milinda Pointer, MD. For neck pain. No medication changes.  9/15/222 Cardiology Darylene Price, Loni Muse, [place]. For chronic systolic heart failure. No medication changes.  05/19/21 Isaias Cowman, MD For follow-Up. Per note:carvedilol 3.125 mg twice daily 05/09/21 Orthopedic Surgeon Poggi, Smith Mince and Lattie Corns For primary osteoarthritis. No information given.   04/30/21 Pain Clinic Milinda Pointer, MD. For shoulder pain. No medication changes.  04/15/21 Cardiology  Paraschos, Sheppard Coil, MD For follow-Up. No medication changes. 04/04/21 Oncology Cammie Sickle, MD. For symptomatic anemia. No medication changes.  03/14/21 Oncology Verlon Au, NP. For new patient. No medication changes.  01/03/21 Orthopedic Surgeon Poggi, Smith Mince and Lattie Corns For primary osteoarthritis. No information given.    Hospital visits:  03/17/21 [facility] Emergency Department (3 Hours) For dyspnea on exertion. No medication changes.  01/29/21 [facility]  (24 Hours) Jennye Boroughs, MD. For symptomatic anemia. No medication changes.   Medication History: Atorvastatin 40 mg 06/14/21 30 DS Lisinopril 40 mg 06/29/21 90 DS   Objective:  Lab Results  Component Value Date   CREATININE 0.98 06/27/2021   BUN 20 06/27/2021   GFRNONAA >60 06/27/2021   GFRAA >60 05/14/2020   NA 136 06/27/2021   K 3.6 06/27/2021   CALCIUM 8.7 (L) 06/27/2021   CO2 29  06/27/2021   GLUCOSE 106 (H) 06/27/2021    Lab Results  Component Value Date/Time   HGBA1C 5.7 (H) 07/17/2019 09:28 AM    Last diabetic  Eye exam: No results found for: HMDIABEYEEXA  Last diabetic Foot exam: No results found for: HMDIABFOOTEX   Lab Results  Component Value Date   CHOL 142 11/25/2020   HDL 54 11/25/2020   LDLCALC 59 11/25/2020   TRIG 174 (H) 11/25/2020    Hepatic Function Latest Ref Rng & Units 06/27/2021 04/30/2021 03/14/2021  Total Protein 6.5 - 8.1 g/dL 6.9 6.4 6.9  Albumin 3.5 - 5.0 g/dL 3.8 4.1 3.7  AST 15 - 41 U/L 18 18 17   ALT 0 - 44 U/L 16 - 13  Alk Phosphatase 38 - 126 U/L 79 98 66  Total Bilirubin 0.3 - 1.2 mg/dL 0.5 <0.2 0.5    Lab Results  Component Value Date/Time   TSH 2.406 03/14/2021 03:32 PM   TSH 3.980 11/25/2020 11:02 AM   TSH 3.220 07/17/2019 09:28 AM   FREET4 1.20 11/25/2020 11:02 AM   FREET4 1.49 07/17/2019 09:28 AM    CBC Latest Ref Rng & Units 06/27/2021 04/04/2021 03/17/2021  WBC 4.0 - 10.5 K/uL 6.4 7.3 7.4  Hemoglobin 12.0 - 15.0 g/dL 12.0 9.9(L) 9.9(L)  Hematocrit 36.0 - 46.0 % 38.0 32.5(L) 32.5(L)  Platelets 150 - 400 K/uL 243 326 350    Lab Results  Component Value Date/Time   VD25OH 25.5 (L) 11/25/2020 11:02 AM   VD25OH 32.8 07/17/2019 09:28 AM    Clinical ASCVD: {YES/NO:21197} The 10-year ASCVD risk score (Arnett DK, et al., 2019) is: 14.7%   Values used to calculate the score:     Age: 13 years     Sex: Female     Is Non-Hispanic African American: No     Diabetic: No     Tobacco smoker: No     Systolic Blood Pressure: 290 mmHg     Is BP treated: Yes     HDL Cholesterol: 54 mg/dL     Total Cholesterol: 142 mg/dL    Depression screen [facility] 2/9 04/29/2021 03/11/2021 12/26/2020  Decreased Interest 3 0 0  Down, Depressed, Hopeless 3 0 0  PHQ - 2 Score 6 0 0  Altered sleeping - - -  Tired, decreased energy - - -  Change in appetite - - -  Feeling bad or failure about yourself  - - -  Trouble concentrating - - -  Moving slowly or fidgety/restless - - -  Suicidal thoughts - - -  PHQ-9 Score - - -  Difficult doing work/chores - - -  Some recent data might  be hidden     ***Other: (CHADS2VASc if Afib, MMRC or CAT for COPD, ACT, DEXA)  Social History   Tobacco Use  Smoking Status Former   Types: Cigarettes  Smokeless Tobacco Never   BP Readings from Last 3 Encounters:  06/27/21 121/61  06/23/21 (!) 144/70  06/18/21 (!) 174/92   Pulse Readings from Last 3 Encounters:  06/27/21 74  06/23/21 71  06/18/21 78   Wt Readings from Last 3 Encounters:  06/27/21 182 lb 3.2 oz (82.6 kg)  06/23/21 180 lb 2 oz (81.7 kg)  06/18/21 181 lb (82.1 kg)   BMI Readings from Last 3 Encounters:  06/27/21 38.08 kg/m  06/23/21 37.65 kg/m  06/18/21 37.83 kg/m    Assessment/Interventions: Review of patient past medical history, allergies, medications, health status, including review of consultants reports, laboratory and  other test data, was performed as part of comprehensive evaluation and provision of chronic care management services.   SDOH:  (Social Determinants of Health) assessments and interventions performed: {yes/no:20286}  SDOH Screenings   Alcohol Screen: Low Risk    Last Alcohol Screening Score (AUDIT): 0  Depression (PHQ2-9): Medium Risk   PHQ-2 Score: 6  Financial Resource Strain: Not on file  Food Insecurity: Not on file  Housing: Not on file  Physical Activity: Not on file  Social Connections: Not on file  Stress: Not on file  Tobacco Use: Medium Risk   Smoking Tobacco Use: Former   Smokeless Tobacco Use: Never   Passive Exposure: Not on file  Transportation Needs: Not on file    [place]  Allergies  Allergen Reactions   Benadryl [Diphenhydramine Hcl (Sleep)] Palpitations   Cetirizine Palpitations   Lasix [Furosemide] Rash   Levaquin [Levofloxacin In D5w] Other (See Comments)    Reaction:  Fatigue and muscle soreness   Meloxicam Rash   Soy Allergy Hives and Nausea And Vomiting   Sulfa Antibiotics Rash    Medications Reviewed Today     Reviewed by Wilford Corner, RN (Registered Nurse) on 06/27/21 at  1451  Med List Status: <None>   Medication Order Taking? Sig Documenting Provider Last Dose Status Informant  ALPRAZolam (XANAX) 0.25 MG tablet [id number] Yes Take 1 tablet (0.25 mg total) by mouth 2 (two) times daily as needed for anxiety. [name], MD Taking Active   atorvastatin (LIPITOR) 40 MG tablet [id number] Yes 1 tab po qhs Jonetta Osgood, NP Taking Active   bumetanide (BUMEX) 1 MG tablet [id number] Yes Take 2 tablets (2 mg total) by mouth 2 (two) times daily. Alisa Graff, FNP Taking Active   Calcium Carbonate-Vitamin D (CALCIUM 600+D PO) [id number] Yes Take 1 tablet by mouth daily. [provider] Taking Active Self  carvedilol (COREG) 3.125 MG tablet [id number] Yes Take 3.125 mg by mouth 2 (two) times daily with a meal. [provider] Taking Active   Coenzyme Q10 (COQ10) 200 MG CAPS [id number] Yes Take 200 mg by mouth daily. [provider] Taking Active Self  diclofenac Sodium (VOLTAREN) 1 % GEL [id number] Yes SMARTSIG:Gram(s) Topical Twice Daily [provider] Taking Active Self  ELIQUIS 5 MG TABS tablet [id number] Yes Take 1 tablet by mouth twice daily.  Patient taking differently: Take 5 mg by mouth 2 (two) times daily.   [name], MD Taking Active   ferrous sulfate 325 (65 FE) MG tablet [id number] Yes Take 2 tablets (650 mg total) by mouth 2 (two) times daily with a meal.  Patient taking differently: Take 325 mg by mouth. Takes 4 tablets by mouth twice a day   Jennye Boroughs, MD Taking Active   flecainide (TAMBOCOR) 50 MG tablet [id number] Yes Take 1 tablet (50 mg total) by mouth 2 (two) times daily. PLEASE CONTACT OFFICE [phone] TO SCHEDULE APPOINTMENT FOR FUTURE REFILLS Deboraha Sprang, MD Taking Active Self  levothyroxine (SYNTHROID) 25 MCG tablet [id number] Yes Take 1 tablet by mouth daily before breakfast. [name], MD Taking Active   lisinopril (ZESTRIL) 40 MG tablet [id number] Yes Take 1 tablet (40 mg total) by mouth  daily. Alisa Graff, FNP Taking Active            Med Note Thomasenia Bottoms Jan 29, 2021  9:40 PM) Pt had today at 1100  loratadine (CLARITIN) 10 MG tablet [id number] Yes Take 10  mg by mouth daily. [provider] Taking Active Self  oxyCODONE-acetaminophen (PERCOCET) 7.5-325 MG tablet [id number] Yes Take one tab in am and half in pm for severe shoulder pain Abernathy, [name], NP Taking Active   pantoprazole (PROTONIX) 40 MG tablet [id number] Yes Take 1 tablet by mouth daily. [name], MD Taking Active   potassium chloride (KLOR-CON) 10 MEQ tablet [id number] Yes Take 4 tablets (40 mEq total) by mouth 2 (two) times daily. [name], MD Taking Active Self  sertraline (ZOLOFT) 50 MG tablet [id number] Yes Take 1 tablet (50 mg total) by mouth daily. [name], MD Taking Active   traZODone (DESYREL) 50 MG tablet [id number] Yes Take 1 tablet by mouth at bedtime. [name], MD Taking Active   TRELEGY ELLIPTA 100-62.5-25 MCG/INH AEPB [id number] Yes INHALE 1 PUFF INTO THE LUNGS DAILY [name], MD Taking Active   VENTOLIN HFA 108 [phone]) MCG/ACT inhaler [id number] Yes INHALE 2 PUFFS INTO THE LUNGS EVERY 6 HOURS AS NEEDED FOR WHEEZING OR SHORTNESS OF Laurey Arrow, NP Taking Active   Med List Note Janett Billow, [place] 06/23/21 6433): UDS 04-30-2021 Clearance to stop Eliquis sent to Dr Saralyn Pilar via Epic  inbox 06/18/21 LP faxed as well. Approved 06/23/21 LP             Patient Active Problem List   Diagnosis Date Noted   Long term prescription benzodiazepine use 04/30/2021   Chronic shoulder pain (1ry area of Pain) (Bilateral) (L>R) 04/30/2021   Chronic upper extremity pain (2ry area of Pain) (Bilateral) (L>R) 04/30/2021   Cervicalgia 04/30/2021   Chronic neck pain (3ry area of Pain) (Posterior) (Bilateral) (L>R) 04/30/2021   Shoulder blade pain (4th area of Pain) (Left) 04/30/2021   Osteoarthritis of glenohumeral joint (Left) 04/30/2021    Osteoarthritis of AC (acromioclavicular) joint (Left) 04/30/2021   Chronic headaches (5th area of Pain) 04/30/2021   Osteoarthritis of glenohumeral joints (Bilateral) 04/30/2021   Osteoarthritis of acromioclavicular joints (Bilateral) 04/30/2021   Primary osteoarthritis of shoulders (Bilateral) 04/30/2021   DDD (degenerative disc disease), cervical 04/30/2021   Cervical radiculitis (Left) 04/30/2021   Cervical radiculopathy (Left) 04/30/2021   C6 radiculopathy (Left) 04/30/2021   C7 radiculopathy (Left) 04/30/2021   Wheelchair bound 04/30/2021   Chronic pain syndrome 04/29/2021   Pharmacologic therapy 04/29/2021   Disorder of skeletal system 04/29/2021   Problems influencing health status 04/29/2021   Chronic anticoagulation (Eliquis) 03/14/2021   Hx of atrioventricular node ablation 03/14/2021   Symptomatic anemia 01/29/2021   Acute on chronic diastolic CHF (congestive heart failure) ([place]) 05/11/2020   COPD (chronic obstructive pulmonary disease) ([place])    Hypothyroidism    Cardiac pacemaker in situ 05/10/2020   Acute non-recurrent frontal sinusitis 04/22/2020   Vertigo 04/22/2020   S/P placement of cardiac pacemaker 02/21/2020   Atrial fibrillation status post cardioversion ([place]) 11/16/2019   Episode of moderate major depression ([place]) 10/10/2019   Persistent atrial fibrillation ([place])    Encounter for general adult medical examination with abnormal findings 07/10/2019   Encounter for screening mammogram for malignant neoplasm of breast 07/10/2019   Atopic dermatitis 05/02/2019   Paroxysmal atrial flutter ([place]) 08/11/2018   Encounter for long-term (current) use of medications 07/09/2018   Chronic left shoulder pain 07/09/2018   Conjunctivitis 07/09/2018   Oxygen dependent 07/09/2018   GAD (generalized anxiety disorder) 07/09/2018   Ovarian failure 07/09/2018   Positive colorectal cancer screening using Cologuard test 04/24/2018   Calculus of gallbladder with acute  on chronic  cholecystitis 04/08/2018   H/O: CVA (cerebrovascular accident) 04/08/2018   Dysuria 03/02/2018   Urinary tract infection without hematuria 03/02/2018   Right upper quadrant abdominal tenderness without rebound tenderness 03/02/2018   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 exacerbation ([place]) 03/02/2018   Chronic obstructive pulmonary disease ([place]) 09/19/2017   Typical atrial flutter ([name])    Irritable bowel syndrome without diarrhea 01/29/2015   Essential hypertension 01/24/2015   Paroxysmal atrial fibrillation ([place]) 01/24/2015   History of aortic valve replacement with bioprosthetic valve 01/24/2015   Atrial fibrillation with RVR ([place]) 01/11/2015   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 05/18/2014   Shoulder impingement syndrome (Left) 05/18/2014   Cellulitis and abscess 02/13/2014   MRSA (methicillin resistant Staphylococcus aureus) 02/13/2014   Aneurysm, ascending aorta 06/23/2013   Bicuspid aortic valve 06/23/2013    Immunization History  Administered Date(s) Administered   Influenza Inj Mdck Quad Pf 06/10/2018, 06/09/2019   Influenza, High Dose Seasonal PF 06/21/2017   Influenza-Unspecified 06/21/2017, 06/12/2020   PFIZER(Purple Top)SARS-COV-2 Vaccination 10/27/2019, 11/17/2019, 07/03/2020   Pneumococcal Conjugate-13 10/07/2017    Conditions to be addressed/monitored:  Afib, HTN, CHF, COPD, IBS, Hypothyroidism, GAD/Depression, Chronic Pain, HLD/HX of CVA  There are no care plans that you recently modified to display for this patient.    Medication Assistance: {MEDASSISTANCEINFO:25044}  Compliance/Adherence/Medication fill history: Care Gaps: ***  Star-Rating Drugs: ***  Patient's preferred pharmacy is:  [id number] [address], [address] [place] [address] Phone: [phone] Fax: [phone]  [facility] [place], [address] [place] [address] Phone: [phone] Fax: [phone]  Uses pill box? {Yes or If no, why not?:20788} Pt endorses ***% compliance  We discussed: {Pharmacy options:24294} Patient decided to: {US Pharmacy Plan:23885}  Care Plan and Follow Up Patient Decision:  {FOLLOWUP:24991}  Plan: {CM FOLLOW UP GGYI:94854}  ***  Current Barriers:  {pharmacybarriers:24917}  Pharmacist Clinical Goal(s):  Patient will {PHARMACYGOALCHOICES:24921} through collaboration with PharmD and provider.   Interventions: 1:1 collaboration with [name], MD regarding development and update of comprehensive plan of care as evidenced by provider attestation and co-signature Inter-disciplinary care team collaboration (see longitudinal plan of care) Comprehensive medication review performed; medication list updated in electronic medical record  Hypertension (BP goal {CHL HP UPSTREAM Pharmacist BP [phone]}) -{US controlled/uncontrolled:25276} -Current treatment: Carvedilol 3.158m bid Lisinopril 470mdaily -Medications previously tried: ***  -Current home readings: *** -Current dietary habits: *** -Current exercise habits: *** -{ACTIONS;DENIES/REPORTS:21021675::"Denies"} hypotensive/hypertensive symptoms -Educated on {CCM BP Counseling:25124} -Counseled to monitor BP at home ***, document, and provide log at future appointments -{CCMPHARMDINTERVENTION:25122}  Hyperlipidemia/HX of CVA: (LDL goal < ***) -{US controlled/uncontrolled:25276} -Current treatment: Atorvastatin 4077maily -Medications previously tried: ***  -Current dietary patterns: *** -Current exercise habits: *** -Educated on {CCM HLD Counseling:25126} -{CCMPHARMDINTERVENTION:25122}  Atrial Fibrillation (Goal: prevent stroke and major bleeding) -{US controlled/uncontrolled:25276} -CHADSVASC: *** -Current treatment: Rate control: Carvedilol 3.125m35mD, Flecainide 50mg66mily Anticoagulation: Eliquis 5mg B34m-Medications previously tried: *** -Home BP and HR readings: ***  -Counseled on {CCMAFIBCOUNSELING:25120} -{CCMPHARMDINTERVENTION:25122}  Heart Failure (Goal: manage symptoms and prevent exacerbations) -{US controlled/uncontrolled:25276} -Last ejection fraction: *** (Date: ***) -HF type: {type of heart failure:30421350} -NYHA Class: {CHL HP Upstream Pharm NYHA Class:[id number]} -AHA HF Stage: {CHL HP Upstream Pharm AHA HF Stage:[phone]} -Current treatment: Bumetanide 1mg da31m 2 tablets bid Carvedilol 3.125mg bi74mth a meal -Medications  previously tried: ***  -Current home BP/HR readings: *** -Current dietary habits: *** -Current exercise habits: *** -Educated on {CCM HF Counseling:25125} -{CCMPHARMDINTERVENTION:25122}  COPD (Goal: control symptoms and prevent exacerbations) -{US controlled/uncontrolled:25276} -Current treatment  Trelegy Ellipta 100-62.5-25 1 puff daily Ventolin HFA 25mg prn  -Medications previously tried: ***  -Gold Grade: {CHL HP Upstream Pharm COPD Gold GLYYTK:[id number]}-Current COPD Classification:  {CHL HP Upstream Pharm COPD Classification:[phone]} -MMRC/CAT score: *** -Pulmonary function testing: *** -Exacerbations requiring treatment in last 6 months: *** -Patient {Actions; denies-reports:120008} consistent use of maintenance inhaler -Frequency of rescue inhaler use: *** -Counseled on {CCMINHALERCOUNSELING:25121} -{CCMPHARMDINTERVENTION:25122}  Depression/Anxiety (Goal: ***) -{US controlled/uncontrolled:25276} -Current treatment: Alprazolam 0.249mSertraline 5059maily Trazodone 52m63m -Medications previously tried/failed: *** -PHQ9: *** -GAD7: *** -Connected with *** for mental health support -Educated on {CCM mental health counseling:25127} -{CCMPHARMDINTERVENTION:25122}  Hypothyroidism (Goal: ***) -{US controlled/uncontrolled:25276} -Current treatment  Levothyroxine 25mc47maily -Medications previously tried: ***  -{CCMPHARMDINTERVENTION:25122}  IBS (Goal: ***) -{US controlled/uncontrolled:25276} -Current treatment  *** -Medications previously tried: ***  -{CCMPHARMDINTERVENTION:25122}  Chronic Pain (Goal: ***) -{US controlled/uncontrolled:25276} -Current treatment  Percocet 7.5-325mg one tablet qam and one-half tablet in pm prn pain -Medications previously tried: ***  -{CCMPHARMDINTERVENTION:25122}  Patient Goals/Self-Care Activities Patient will:  - {pharmacypatientgoals:24919}  Follow Up Plan: {CM FOLLOW UP PLAN:NTZG:01749}

## 2021-07-01 ENCOUNTER — Telehealth: Payer: Self-pay

## 2021-07-01 ENCOUNTER — Ambulatory Visit: Payer: Medicare Other | Admitting: Pain Medicine

## 2021-07-01 LAB — COMP PANEL: LEUKEMIA/LYMPHOMA

## 2021-07-01 NOTE — Telephone Encounter (Signed)
Pt called requesting a refill on alprazolam, I informed pt to call her pharmacy as she still should have 1 refill left

## 2021-07-02 ENCOUNTER — Telehealth: Payer: Medicare Other

## 2021-07-02 DIAGNOSIS — J449 Chronic obstructive pulmonary disease, unspecified: Secondary | ICD-10-CM | POA: Diagnosis not present

## 2021-07-03 ENCOUNTER — Other Ambulatory Visit: Payer: Self-pay | Admitting: Nurse Practitioner

## 2021-07-03 ENCOUNTER — Telehealth: Payer: Self-pay

## 2021-07-03 DIAGNOSIS — G8929 Other chronic pain: Secondary | ICD-10-CM

## 2021-07-03 LAB — PNH PROFILE (-HIGH SENSITIVITY)

## 2021-07-03 MED ORDER — OXYCODONE-ACETAMINOPHEN 7.5-325 MG PO TABS: ORAL_TABLET | ORAL | 0 refills | Status: DC

## 2021-07-03 NOTE — Telephone Encounter (Signed)
Med sent to pharmacy.

## 2021-07-11 ENCOUNTER — Other Ambulatory Visit: Payer: Self-pay | Admitting: General Surgery

## 2021-07-11 ENCOUNTER — Other Ambulatory Visit (HOSPITAL_COMMUNITY): Payer: Self-pay | Admitting: General Surgery

## 2021-07-11 ENCOUNTER — Telehealth: Payer: Self-pay

## 2021-07-11 DIAGNOSIS — K8020 Calculus of gallbladder without cholecystitis without obstruction: Secondary | ICD-10-CM

## 2021-07-11 DIAGNOSIS — R1011 Right upper quadrant pain: Secondary | ICD-10-CM

## 2021-07-11 NOTE — Telephone Encounter (Signed)
Left vm to confirm 07/15/21 appointment-Toni

## 2021-07-15 ENCOUNTER — Ambulatory Visit (INDEPENDENT_AMBULATORY_CARE_PROVIDER_SITE_OTHER): Payer: Medicare Other | Admitting: Nurse Practitioner

## 2021-07-15 ENCOUNTER — Encounter: Payer: Self-pay | Admitting: Nurse Practitioner

## 2021-07-15 ENCOUNTER — Other Ambulatory Visit: Payer: Self-pay

## 2021-07-15 VITALS — BP 123/60 | HR 72 | Temp 98.4°F | Resp 16 | Ht <= 58 in | Wt 183.6 lb

## 2021-07-15 DIAGNOSIS — R300 Dysuria: Secondary | ICD-10-CM | POA: Diagnosis not present

## 2021-07-15 DIAGNOSIS — J449 Chronic obstructive pulmonary disease, unspecified: Secondary | ICD-10-CM

## 2021-07-15 DIAGNOSIS — I480 Paroxysmal atrial fibrillation: Secondary | ICD-10-CM | POA: Diagnosis not present

## 2021-07-15 DIAGNOSIS — Z0001 Encounter for general adult medical examination with abnormal findings: Secondary | ICD-10-CM | POA: Diagnosis not present

## 2021-07-15 DIAGNOSIS — G4733 Obstructive sleep apnea (adult) (pediatric): Secondary | ICD-10-CM

## 2021-07-15 DIAGNOSIS — I10 Essential (primary) hypertension: Secondary | ICD-10-CM | POA: Diagnosis not present

## 2021-07-15 DIAGNOSIS — I700 Atherosclerosis of aorta: Secondary | ICD-10-CM

## 2021-07-15 NOTE — Progress Notes (Signed)
[facility] [place], [place] [address]  Internal MEDICINE  Office Visit Note  Patient Name: [name]  [id number]  [id number]  Date of Service: 07/15/2021  Chief Complaint  Patient presents with   Medicare Wellness    Hot flashes   Hyperlipidemia   Hypertension   COPD   Headache   Chills    HPI [name] presents for an annual well visit and physical exam.  She is a well-appearing 75 year old female.  She is due for a mammogram this month and will call to schedule.  Her colonoscopy was done in 2019 and was normal so she does not need to repeat it for 10 years.  She had her DEXA scan and 2017.  She is currently declining any vaccines for now.  She wears supplemental oxygen and is typically on 3-1/2 L/min at home.  Her oxygen saturation is 94% and she is on 2 L/min during her office visit today.  She reports hot flashes that have been bothering her but she is not interested in trying any medication to alleviate the hot flashes.  She has hypertension but her blood pressure is currently well controlled.  She sees cardiology for congestive heart failure and atrial fibrillation.  She takes levothyroxine for hypothyroidism and this is monitored periodically with labs.  She does have COPD she has reported no worsening of her breathing since her last office visit and she takes Trelegy Ellipta for maintenance inhaler which she reports works well for her.  She sees Dr. Dossie Arbour for pain management.  She takes atorvastatin for elevated cholesterol levels.  Started on amlodipine 10 mg per report.   No change in breathing. 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  Irregular hr  Current Medication: Outpatient Encounter Medications as of 07/15/2021  Medication Sig Note   ALPRAZolam (XANAX) 0.25 MG tablet Take 1 tablet (0.25 mg total) by mouth 2 (two) times daily as needed for  anxiety.    amLODipine (NORVASC) 10 MG tablet Take 1 tablet by mouth daily.    atorvastatin (LIPITOR) 40 MG tablet 1 tab po qhs    bumetanide (BUMEX) 1 MG tablet TAKE 2 TABLETS BY MOUTH TWICE DAILY    Calcium Carbonate-Vitamin D (CALCIUM 600+D PO) Take 1 tablet by mouth daily.    carvedilol (COREG) 3.125 MG tablet Take 3.125 mg by mouth 2 (two) times daily with a meal. 08/04/2021: Patient taking differently per cardiology, 2 tabs twice daily   Coenzyme Q10 (COQ10) 200 MG CAPS Take 200 mg by mouth daily.    diclofenac Sodium (VOLTAREN) 1 % GEL SMARTSIG:Gram(s) Topical Twice Daily    ELIQUIS 5 MG TABS tablet Take 1 tablet by mouth twice daily. (Patient taking differently: Take 5 mg by mouth 2 (two) times daily.)    ferrous sulfate 325 (65 FE) MG tablet Take 2 tablets (650 mg total) by mouth 2 (two) times daily with a meal. (Patient taking differently: Take 325 mg by mouth. Takes 4 tablets by mouth twice a day)    flecainide (TAMBOCOR) 50 MG tablet Take 1 tablet (50 mg total) by mouth 2 (two) times daily. PLEASE CONTACT OFFICE [phone] TO SCHEDULE APPOINTMENT FOR FUTURE REFILLS    levothyroxine (SYNTHROID) 25 MCG tablet Take 1 tablet by mouth daily before breakfast.    lisinopril (ZESTRIL) 40 MG tablet Take 1 tablet (40 mg total) by  mouth daily. 01/29/2021: Pt had today at 1100   loratadine (CLARITIN) 10 MG tablet Take 10 mg by mouth daily.    pantoprazole (PROTONIX) 40 MG tablet Take 1 tablet by mouth daily.    potassium chloride (KLOR-CON) 10 MEQ tablet Take 4 tablets (40 mEq total) by mouth 2 (two) times daily.    sertraline (ZOLOFT) 50 MG tablet Take 1 tablet (50 mg total) by mouth daily.    traZODone (DESYREL) 50 MG tablet Take 1 tablet by mouth at bedtime.    TRELEGY ELLIPTA 100-62.5-25 MCG/INH AEPB INHALE 1 PUFF INTO THE LUNGS DAILY    VENTOLIN HFA 108 (90 Base) MCG/ACT inhaler INHALE 2 PUFFS INTO THE LUNGS EVERY 6 HOURS AS NEEDED FOR WHEEZING OR SHORTNESS OF BREATH    [DISCONTINUED]  oxyCODONE-acetaminophen (PERCOCET) 7.5-325 MG tablet Take one tab in am and half in pm for severe shoulder pain    No facility-administered encounter medications on file as of 07/15/2021.    Surgical History: Past Surgical History:  Procedure Laterality Date   ABDOMINAL AORTIC ANEURYSM REPAIR  2008   ABDOMINAL HYSTERECTOMY     AORTIC VALVE REPLACEMENT  2008   CARDIAC CATHETERIZATION     [place]   CARDIOVERSION N/A 08/15/2018   Procedure: CARDIOVERSION;  Surgeon: [name], MD;  Location: [facility] ORS;  Service: Cardiovascular;  Laterality: N/A;   CARDIOVERSION N/A 11/14/2018   Procedure: CARDIOVERSION (CATH LAB);  Surgeon: [name], MD;  Location: [facility];  Service: Cardiovascular;  Laterality: N/A;   CARDIOVERSION N/A 06/05/2019   Procedure: CARDIOVERSION;  Surgeon: [name], MD;  Location: [facility];  Service: Cardiovascular;  Laterality: N/A;   CARDIOVERSION N/A 08/14/2019   Procedure: CARDIOVERSION;  Surgeon: [name], MD;  Location: [place] [facility];  Service: Cardiovascular;  Laterality: N/A;   CARDIOVERSION N/A 11/09/2019   Procedure: CARDIOVERSION;  Surgeon: Isaias Cowman, MD;  Location: [facility];  Service: Cardiovascular;  Laterality: N/A;   CARDIOVERSION N/A 01/17/2020   Procedure: CARDIOVERSION;  Surgeon: Isaias Cowman, MD;  Location: [facility];  Service: Cardiovascular;  Laterality: N/A;   CARPAL TUNNEL RELEASE     ELECTROPHYSIOLOGIC STUDY N/A 08/17/2016   Procedure: Cardioversion;  Surgeon: [name], MD;  Location: [facility] ORS;  Service: Cardiovascular;  Laterality: N/A;   TUMOR EXCISION Left    x3 (arm)    Medical History: Past Medical History:  Diagnosis Date   Aortic 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 s/p bioprosthetic valve replacement 2008 at [facility];  b. 01/2015 Echo: EF 60-65%, no rwma, Gr1 DD, mildly dil LA, nl RV fxn.   Atrial fibrillation with RVR ([place]) 01/11/2015   Atrial flutter ([place])    a. 08/2016 s/p DCCV.  Remains on  flecainide 50 mg bid.   Basal skull fracture ([name]) 20 yrs ago   CHF (congestive heart failure) ([name])    Chronic respiratory failure ([name])    COPD (chronic obstructive pulmonary disease) ([place])    a. 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 2008 prior to Aortic aneurysm repair-->nl cors.   History of stress test    a. 10/2015 MV: no ischemia/infarct.   HLD (hyperlipidemia)    HTN (hypertension)    Hypothyroidism    Obesity    PAF (paroxysmal atrial fibrillation) ([place])    a. on  Eliquis; b. CHADS2VASc = 3 (HTN, age x 1, female).   Paroxysmal atrial fibrillation ([place]) 01/24/2015   Right upper quadrant abdominal tenderness without rebound tenderness 03/02/2018   S/P ascending aortic aneurysm repair 2008    Family History: Family History  Problem Relation Age of Onset   Stroke Father    Stroke Paternal Grandmother     Social History   Socioeconomic History   Marital status: Single    Spouse name: Not on file   Number of children: Not on file   Years of education: Not on file   Highest education level: Not on file  Occupational History   Not on file  Tobacco Use   Smoking status: Former    Types: Cigarettes   Smokeless tobacco: Never  Vaping Use   Vaping Use: Never used  Substance and Sexual Activity   Alcohol use: No   Drug use: No   Sexual activity: Never    Birth control/protection: Surgical  Other Topics Concern   Not on file  Social History Narrative   Not on file   Social Determinants of Health   Financial Resource Strain: Low Risk    Difficulty of Paying Living Expenses: Not very hard  Food Insecurity: Not on file  Transportation Needs: Not on file  Physical Activity: Not on file  Stress: Not on file  Social Connections: Not on file  Intimate Partner Violence: Not on file      Review of Systems  Constitutional:  Negative for activity  change, appetite change, chills, fatigue, fever and unexpected weight change.  HENT: Negative.  Negative for congestion, ear pain, rhinorrhea, sore throat and trouble swallowing.   Eyes: Negative.   Respiratory: Negative.  Negative for cough, chest tightness, shortness of breath and wheezing.   Cardiovascular: Negative.  Negative for chest pain.  Gastrointestinal: Negative.  Negative for abdominal pain, blood in stool, constipation, diarrhea, nausea and vomiting.  Endocrine: Negative.   Genitourinary: Negative.  Negative for difficulty urinating, dysuria, frequency, hematuria and urgency.  Musculoskeletal: Negative.  Negative for arthralgias, back pain, joint swelling, myalgias and neck pain.  Skin: Negative.  Negative for rash and wound.  Allergic/Immunologic: Negative.  Negative for immunocompromised state.  Neurological:  Positive for headaches. Negative for dizziness, seizures and numbness.  Hematological: Negative.   Psychiatric/Behavioral: Negative.  Negative for behavioral problems, self-injury and suicidal ideas. The patient is not nervous/anxious.    Vital Signs: BP 123/60   Pulse 72   Temp 98.4 F (36.9 C)   Resp 16   Ht 4\' 10"  (1.473 m)   Wt 183 lb 9.6 oz (83.3 kg)   SpO2 94% Comment: with 2L oxygen, pt normally is on 3.5L at home  BMI 38.37 kg/m    Physical Exam Vitals reviewed.  Constitutional:      General: She is awake. She is not in acute distress.    Appearance: Normal appearance. She is well-developed. She is obese. She is not ill-appearing or diaphoretic.  HENT:     Head: Normocephalic and atraumatic.     Right Ear: Tympanic membrane, ear canal and external ear normal.     Left Ear: Tympanic membrane, ear canal and external ear normal.     Nose: Nose normal. No congestion or rhinorrhea.     Mouth/Throat:     Lips: Pink.     Mouth: Mucous membranes are moist.     Pharynx: Oropharynx is clear. Uvula midline. No oropharyngeal exudate or posterior oropharyngeal  erythema.  Eyes:  General: Lids are normal. Vision grossly intact. Gaze aligned appropriately. No scleral icterus.       Right eye: No discharge.        Left eye: No discharge.     Extraocular Movements: Extraocular movements intact.     Conjunctiva/sclera: Conjunctivae normal.     Pupils: Pupils are equal, round, and reactive to light.     Funduscopic exam:    Right eye: Red reflex present.        Left eye: Red reflex present. Neck:     Thyroid: No thyromegaly.     Vascular: No JVD.     Trachea: No tracheal deviation.  Cardiovascular:     Rate and Rhythm: Normal rate and regular rhythm.     Pulses: Normal pulses.     Heart sounds: Normal heart sounds, S1 normal and S2 normal. No murmur heard.   No friction rub. No gallop.  Pulmonary:     Effort: Pulmonary effort is normal. No accessory muscle usage or respiratory distress.     Breath sounds: Normal breath sounds and air entry. No stridor. No wheezing or rales.  Chest:     Chest wall: No tenderness.     Comments: Declined clinical breast exam.  Abdominal:     General: Bowel sounds are normal. There is no distension.     Palpations: Abdomen is soft. There is no shifting dullness, fluid wave, mass or pulsatile mass.     Tenderness: There is no abdominal tenderness. There is no guarding or rebound.  Musculoskeletal:        General: No tenderness or deformity. Normal range of motion.     Cervical back: Normal range of motion and neck supple.  Lymphadenopathy:     Cervical: No cervical adenopathy.  Skin:    General: Skin is warm and dry.     Capillary Refill: Capillary refill takes less than 2 seconds.     Coloration: Skin is not pale.     Findings: No erythema or rash.  Neurological:     Mental Status: She is alert and oriented to person, place, and time.     Cranial Nerves: No cranial nerve deficit.     Motor: No abnormal muscle tone.     Coordination: Coordination normal.     Gait: Gait normal.     Deep Tendon Reflexes:  Reflexes are normal and symmetric.  Psychiatric:        Mood and Affect: Mood normal.        Behavior: Behavior normal. Behavior is cooperative.        Thought Content: Thought content normal.        Judgment: Judgment normal.       Assessment/Plan: 1. Encounter for general adult medical examination with abnormal findings Age-appropriate preventive screenings and vaccinations discussed, annual physical exam completed. PHM updated.   2. Benign hypertension Stable with current medications, continue as prescribed.   3. PAF (paroxysmal atrial fibrillation) ([name]) Taking carvedilol for rate control and flecainide.   4. 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 exacerbation ([place]) On trelegy, stable doing well.   5. OSA (obstructive sleep apnea) On cpap. Doing well, no complaints, sleeps better.   6. Aortic atherosclerosis ([place]) On atorvastatin. Continue as prescribed.   7. Dysuria Routine urinalysis done.  - UA/M w/rflx Culture, Routine     General Counseling: Maya verbalizes understanding of the findings of todays visit and agrees with plan of treatment. I have discussed any further diagnostic evaluation that may be needed or  ordered today. We also reviewed her medications today. she has been encouraged to call the office with any questions or concerns that should arise related to todays visit.    Orders Placed This Encounter  Procedures   Microscopic Examination   UA/M w/rflx Culture, Routine    No orders of the defined types were placed in this encounter.   Return in about 3 months (around 10/15/2021) for F/U, med refill, [place] PCP.   Total time spent:30 Minutes Time spent includes review of chart, medications, test results, and follow up plan with the patient.   [place] Controlled Substance Database was reviewed by me.  This patient was seen by Jonetta Osgood, FNP-C in collaboration with Dr. Clayborn Bigness as a part of collaborative care agreement.  Tauni Sanks R. Valetta Fuller,  MSN, FNP-C Internal medicine

## 2021-07-16 LAB — UA/M W/RFLX CULTURE, ROUTINE
Bilirubin, UA: NEGATIVE
Glucose, UA: NEGATIVE
Ketones, UA: NEGATIVE
Leukocytes,UA: NEGATIVE
Nitrite, UA: NEGATIVE
Protein,UA: NEGATIVE
RBC, UA: NEGATIVE
Specific Gravity, UA: 1.012 (ref 1.005–1.030)
Urobilinogen, Ur: 0.2 mg/dL (ref 0.2–1.0)
pH, UA: 6.5 (ref 5.0–7.5)

## 2021-07-16 LAB — MICROSCOPIC EXAMINATION
Bacteria, UA: NONE SEEN
Casts: NONE SEEN /lpf
Epithelial Cells (non renal): NONE SEEN /hpf (ref 0–10)
RBC, Urine: NONE SEEN /hpf (ref 0–2)
WBC, UA: NONE SEEN /hpf (ref 0–5)

## 2021-07-22 ENCOUNTER — Encounter: Payer: Self-pay | Admitting: Gastroenterology

## 2021-07-22 ENCOUNTER — Ambulatory Visit: Payer: Medicare Other | Admitting: Pain Medicine

## 2021-07-22 ENCOUNTER — Ambulatory Visit (INDEPENDENT_AMBULATORY_CARE_PROVIDER_SITE_OTHER): Payer: Medicare Other | Admitting: Gastroenterology

## 2021-07-22 VITALS — BP 113/73 | HR 52 | Temp 98.1°F | Ht <= 58 in | Wt 183.0 lb

## 2021-07-22 DIAGNOSIS — K589 Irritable bowel syndrome without diarrhea: Secondary | ICD-10-CM

## 2021-07-22 MED ORDER — DICYCLOMINE HCL 10 MG PO CAPS: 10.0000 mg | ORAL_CAPSULE | Freq: Three times a day (TID) | ORAL | 0 refills | Status: DC | PRN

## 2021-07-22 NOTE — Progress Notes (Signed)
Gastroenterology Consultation  Referring Provider:     [name], MD Primary Care Physician:  [name], MD Primary Gastroenterologist:  Dr. Allen Norris     Reason for Consultation:     Irritable bowel syndrome        HPI:   [name] is a 75 y.o. y/o female referred for consultation & management of Irritable bowel syndrome by Dr. Humphrey Rolls, Timoteo Gaul, MD.  This patient comes in with a history of multiple medical problems and having seen Dr. Vira Agar in the past and his nurse practitioner all the way up to 2017.  The patient reports that this is not true despite multiple notes she states that she has not been seen by gastroenterology at [facility] clinic for the last 10 years.  It turns out that in May of this year the patient was seen in the hospital by Dr. Haig Prophet also. The patient did undergo a colonoscopy for screening in [facility] in September 2019.  It was done they are because She was deemed too high of a risk by her previous gastroenterologist to undergo a colonoscopy with her cardiopulmonary status.  The patient reports that she was started on a medication that helped her greatly but states that she was taken off the medication by her cardiologist because it interfered with her other medications.  Upon looking at the patient's chart it appears that dicyclomine was a medication she was prescribed and that she reported helped.  She denies any black stools or bloody stools.  The patient also is concerned and asks if gallstones can cause all of the problems she is having with alternating diarrhea and constipation and abdominal pain.  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 s/p bioprosthetic valve replacement 2008 at [facility];  b. 01/2015 Echo: EF 60-65%, no rwma, Gr1 DD, mildly dil LA, nl RV fxn.   Atrial fibrillation with RVR ([place]) 01/11/2015   Atrial flutter ([place])    a. 08/2016 s/p DCCV.  Remains on flecainide 50 mg bid.   Basal  skull fracture ([name]) 20 yrs ago   CHF (congestive heart failure) ([name])    Chronic respiratory failure ([name])    COPD (chronic obstructive pulmonary disease) ([place])    a. 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 2008 prior to Aortic aneurysm repair-->nl cors.   History of stress test    a. 10/2015 MV: no ischemia/infarct.   HLD (hyperlipidemia)    HTN (hypertension)    Hypothyroidism    Obesity    PAF (paroxysmal atrial fibrillation) ([name])    a. on Eliquis; b. CHADS2VASc = 3 (HTN, age x 33, female).   Paroxysmal atrial fibrillation ([place]) 01/24/2015   Right upper quadrant abdominal tenderness without rebound tenderness 03/02/2018   S/P ascending aortic aneurysm repair 2008    Past Surgical History:  Procedure Laterality Date   ABDOMINAL AORTIC ANEURYSM REPAIR  2008   ABDOMINAL HYSTERECTOMY     AORTIC VALVE REPLACEMENT  2008   CARDIAC CATHETERIZATION     [facility]   CARDIOVERSION N/A 08/15/2018   Procedure: CARDIOVERSION;  Surgeon: [name], MD;  Location: [facility] ORS;  Service: Cardiovascular;  Laterality: N/A;   CARDIOVERSION N/A 11/14/2018   Procedure: CARDIOVERSION (CATH LAB);  Surgeon: [name], MD;  Location: [facility];  Service: Cardiovascular;  Laterality: N/A;   CARDIOVERSION N/A 06/05/2019   Procedure: CARDIOVERSION;  Surgeon: [name], MD;  Location: [facility];  Service: Cardiovascular;  Laterality: N/A;   CARDIOVERSION N/A 08/14/2019   Procedure: CARDIOVERSION;  Surgeon: [name], MD;  Location: [place] [facility];  Service: Cardiovascular;  Laterality: N/A;   CARDIOVERSION N/A 11/09/2019   Procedure: CARDIOVERSION;  Surgeon: Isaias Cowman, MD;  Location: [place] [facility];  Service: Cardiovascular;  Laterality: N/A;   CARDIOVERSION N/A 01/17/2020   Procedure: CARDIOVERSION;  Surgeon: Isaias Cowman, MD;  Location: [facility];  Service:  Cardiovascular;  Laterality: N/A;   CARPAL TUNNEL RELEASE     ELECTROPHYSIOLOGIC STUDY N/A 08/17/2016   Procedure: Cardioversion;  Surgeon: [name], MD;  Location: [facility];  Service: Cardiovascular;  Laterality: N/A;   TUMOR EXCISION Left    x3 (arm)    Prior to Admission medications   Medication Sig Start Date End Date Taking? Authorizing Provider  ALPRAZolam (XANAX) 0.25 MG tablet Take 1 tablet (0.25 mg total) by mouth 2 (two) times daily as needed for anxiety. 04/21/21  Yes [name], MD  amLODipine (NORVASC) 10 MG tablet Take 1 tablet by mouth daily. 07/08/21 07/08/22 Yes [provider]  atorvastatin (LIPITOR) 40 MG tablet 1 tab po qhs 03/06/21  Yes Abernathy, [name], NP  bumetanide (BUMEX) 1 MG tablet TAKE 2 TABLETS BY MOUTH TWICE DAILY 06/30/21  Yes Darylene Price A, FNP  Calcium Carbonate-Vitamin D (CALCIUM 600+D PO) Take 1 tablet by mouth daily.   Yes [provider]  carvedilol (COREG) 3.125 MG tablet Take 3.125 mg by mouth 2 (two) times daily with a meal.   Yes [provider]  Coenzyme Q10 (COQ10) 200 MG CAPS Take 200 mg by mouth daily.   Yes [provider]  diclofenac Sodium (VOLTAREN) 1 % GEL SMARTSIG:Gram(s) Topical Twice Daily 09/09/20  Yes [provider]  dicyclomine (BENTYL) 10 MG capsule Take 1 capsule (10 mg total) by mouth 3 (three) times daily as needed for spasms. 07/22/21  Yes Lucilla Lame, MD  ELIQUIS 5 MG TABS tablet Take 1 tablet by mouth twice daily. Patient taking differently: Take 5 mg by mouth 2 (two) times daily. 10/26/19  Yes [name], MD  ferrous sulfate 325 (65 FE) MG tablet Take 2 tablets (650 mg total) by mouth 2 (two) times daily with a meal. Patient taking differently: Take 325 mg by mouth. Takes 4 tablets by mouth twice a day 01/30/21  Yes Jennye Boroughs, MD  flecainide (TAMBOCOR) 50 MG tablet Take 1 tablet (50 mg total) by mouth 2 (two) times daily. PLEASE CONTACT OFFICE [phone] TO  SCHEDULE APPOINTMENT FOR FUTURE REFILLS 02/23/20  Yes Deboraha Sprang, MD  levothyroxine (SYNTHROID) 25 MCG tablet Take 1 tablet by mouth daily before breakfast. 06/22/21  Yes [name], MD  lisinopril (ZESTRIL) 40 MG tablet Take 1 tablet (40 mg total) by mouth daily. 10/07/20 03/14/22 Yes Hackney, Tina A, FNP  loratadine (CLARITIN) 10 MG tablet Take 10 mg by mouth daily.   Yes [provider]  oxyCODONE-acetaminophen (PERCOCET) 7.5-325 MG tablet Take one tab in am and half in pm for severe shoulder pain 07/03/21  Yes Abernathy, [name], NP  pantoprazole (PROTONIX) 40 MG tablet Take 1 tablet by mouth daily. 06/22/21  Yes [name], MD  potassium chloride (KLOR-CON) 10 MEQ tablet Take 4 tablets (40 mEq total) by mouth 2 (two)  times daily. 11/28/20  Yes [name], MD  sertraline (ZOLOFT) 50 MG tablet Take 1 tablet (50 mg total) by mouth daily. 02/10/21  Yes [name], MD  traZODone (DESYREL) 50 MG tablet Take 1 tablet by mouth at bedtime. 05/23/21  Yes [name], MD  TRELEGY ELLIPTA 100-62.5-25 MCG/INH AEPB INHALE 1 PUFF INTO THE LUNGS DAILY 06/11/21  Yes [name], MD  VENTOLIN HFA 108 [phone] Base) MCG/ACT inhaler INHALE 2 PUFFS INTO THE LUNGS EVERY 6 HOURS AS NEEDED FOR WHEEZING OR SHORTNESS OF BREATH 02/17/21  Yes Abernathy, Yetta Flock, NP    Family History  Problem Relation Age of Onset   Stroke Father    Stroke Paternal Grandmother      Social History   Tobacco Use   Smoking status: Former    Types: Cigarettes   Smokeless tobacco: Never  Vaping Use   Vaping Use: Never used  Substance Use Topics   Alcohol use: No   Drug use: No    Allergies as of 07/22/2021 - Review Complete 07/22/2021  Allergen Reaction Noted   Benadryl [diphenhydramine hcl (sleep)] Palpitations 01/10/2015   Cetirizine Palpitations 01/10/2015   Lasix [furosemide] Rash 01/10/2015   Levaquin [levofloxacin in d5w] Other (See Comments) 01/10/2015   Meloxicam Rash 01/10/2015   Soy allergy Hives and  Nausea And Vomiting 06/16/2016   Sulfa antibiotics Rash 01/11/2015    Review of Systems:    All systems reviewed and negative except where noted in HPI.   Physical Exam:  BP 113/73   Pulse (!) 52   Temp 98.1 F (36.7 C) (Oral)   Ht 4\' 10"  (1.473 m)   Wt 183 lb (83 kg)   BMI 38.25 kg/m  No LMP recorded. Patient has had a hysterectomy. General:   Alert,  Well-developed, well-nourished, pleasant and cooperative in NAD Head:  Normocephalic and atraumatic. Eyes:  Sclera clear, no icterus.   Conjunctiva pink. Ears:  Normal auditory acuity. Neck:  Supple; no masses or thyromegaly. Lungs:  The patient is on oxygen while in my office today.  She has diffuse decreased breath sounds bilaterally Heart:  Regular rate and rhythm; no murmurs, clicks, rubs, or gallops. Abdomen:  Normal bowel sounds.  No bruits.  Soft, non-tender and non-distended without masses, hepatosplenomegaly or hernias noted.  No guarding or rebound tenderness.  Negative Carnett sign.   Rectal:  Deferred.  Pulses:  Normal pulses noted. Extremities:  No clubbing or edema.  No cyanosis. Neurologic:  Alert and oriented x3;  grossly normal neurologically. Skin:  Intact without significant lesions or rashes.  No jaundice. Lymph Nodes:  No significant cervical adenopathy. Psych:  Alert and cooperative. Normal mood and affect.  Imaging Studies: No results found.  Assessment and Plan:   [name] is a 75 y.o. y/o female Who comes in today with a history of irritable bowel syndrome with alternating diarrhea and constipation.  The patient has followed up at [facility] for her colonoscopies and now comes to me for her irritable bowel.  The patient appears to have been on Bentyl in the past and she states that worked very well for her.  She was taken off because of a concern of drug interactions.  I reviewed her medical record including the present medications she is taking and random through a drug interaction  program to see if there was any interactions of Bentyl with any of her medications she is presently taking without any significant drug to drug interactions.  The patient will be started on dicyclomine 10 mg 3 times a day to see if this helps her symptoms again.  The patient has been explained the plan and agrees with it.    Lucilla Lame, MD. Marval Regal    Note: This dictation was prepared with Dragon dictation along with smaller phrase technology. Any transcriptional errors that result from this process are unintentional.

## 2021-07-23 DIAGNOSIS — Q231 Congenital insufficiency of aortic valve: Secondary | ICD-10-CM | POA: Diagnosis not present

## 2021-07-23 DIAGNOSIS — I7121 Aneurysm of the ascending aorta, without rupture: Secondary | ICD-10-CM | POA: Diagnosis not present

## 2021-07-23 NOTE — Progress Notes (Signed)
PROVIDER NOTE: Information contained herein reflects review and annotations entered in association with encounter. Interpretation of such information and data should be left to medically-trained personnel. Information provided to patient can be located elsewhere in the medical record under "Patient Instructions". Document created using STT-dictation technology, any transcriptional errors that may result from process are unintentional.    Patient: [name]  Service Category: Procedure Provider: Gaspar Cola, MD DOB: 06-08-1946 DOS: 07/24/2021 Location: [facility] MRN: [id number] Setting: Ambulatory - outpatient Referring Provider: [name], MD Type: Established Patient Specialty: Interventional Pain Management PCP: [name], MD  Primary Reason for Visit: Interventional Pain Management Treatment. CC: Shoulder Pain (bilateral)    Procedure:          Anesthesia, Analgesia, Anxiolysis:  Type: Diagnostic Suprascapular nerve Block #1  Primary Purpose: Diagnostic Region: Posterior Shoulder & Scapular Areas Level: Superior to the scapular spine, in the lateral aspect of the supraspinatus fossa (Suprescapular notch). Target Area: Suprascapular nerve as it passes thru the lower portion of the suprascapular notch. Approach: Posterior percutaneous approach. Laterality: Bilateral  Anesthesia: Local (1-2% Lidocaine)  Anxiolysis: None  Sedation: None  Guidance: Fluoroscopy           Position: Prone   Indications: 1. Chronic shoulder pain (1ry area of Pain) (Bilateral) (L>R)   2. Osteoarthritis of acromioclavicular joints (Bilateral)   3. Osteoarthritis of glenohumeral joints (Bilateral)   4. Primary osteoarthritis of shoulders (Bilateral)   5. Shoulder impingement syndrome (Left)   6. Shoulder blade pain (4th area of Pain) (Left)   7. Chronic anticoagulation (Eliquis)    Pain Score: Pre-procedure: 8 /10 Post-procedure: 0-No pain/10     Pre-op H&P  Assessment:  [name] is a 75 y.o. (year old), female patient, seen today for interventional treatment. She  has a past surgical history that includes Abdominal aortic aneurysm repair (2008); Aortic valve replacement (2008); Abdominal hysterectomy; Carpal tunnel release; Tumor excision (Left); Cardiac catheterization; Cardiac catheterization (N/A, 08/17/2016); CARDIOVERSION (N/A, 08/15/2018); CARDIOVERSION (N/A, 11/14/2018); Cardioversion (N/A, 06/05/2019); Cardioversion (N/A, 08/14/2019); Cardioversion (N/A, 11/09/2019); and Cardioversion (N/A, 01/17/2020). [name] has a current medication list which includes the following prescription(s): alprazolam, amlodipine, atorvastatin, bumetanide, calcium carbonate-vitamin d, carvedilol, coq10, diclofenac sodium, dicyclomine, eliquis, ferrous sulfate, flecainide, levothyroxine, lisinopril, loratadine, oxycodone-acetaminophen, pantoprazole, potassium chloride, sertraline, trazodone, ventolin hfa, and trelegy ellipta, and the following Facility-Administered Medications: pentafluoroprop-tetrafluoroeth. Her primarily concern today is the Shoulder Pain (bilateral)  Initial Vital Signs:  Pulse/HCG Rate: 70  Temp: (!) 97.2 F (36.2 C) Resp: 18 BP: 135/65 SpO2: 97 % (O2 at 3 liters/min.)  BMI: Estimated body mass index is 38.87 kg/m as calculated from the following:   Height as of this encounter: 4\' 10"  (1.473 m).   Weight as of this encounter: 186 lb (84.4 kg).  Risk Assessment: Allergies: Reviewed. She is allergic to benadryl [diphenhydramine hcl (sleep)], cetirizine, lasix [furosemide], levaquin [levofloxacin in d5w], meloxicam, soy allergy, and sulfa antibiotics.  Allergy Precautions: None required Coagulopathies: Reviewed. None identified.  Blood-thinner therapy: None at this time Active Infection(s): Reviewed. None identified. [name] is afebrile  Site Confirmation: [name] was asked to confirm the procedure and laterality before marking the  site Procedure checklist: Completed Consent: Before the procedure and under the influence of no sedative(s), amnesic(s), or anxiolytics, the patient was informed of the treatment options, risks and possible complications. To fulfill our ethical and legal obligations, as recommended by the American Medical Association's Code of Ethics, I have informed the patient  of my clinical impression; the nature and purpose of the treatment or procedure; the risks, benefits, and possible complications of the intervention; the alternatives, including doing nothing; the risk(s) and benefit(s) of the alternative treatment(s) or procedure(s); and the risk(s) and benefit(s) of doing nothing. The patient was provided information about the general risks and possible complications associated with the procedure. These may include, but are not limited to: failure to achieve desired goals, infection, bleeding, organ or nerve damage, allergic reactions, paralysis, and death. In addition, the patient was informed of those risks and complications associated to the procedure, such as failure to decrease pain; infection; bleeding; organ or nerve damage with subsequent damage to sensory, motor, and/or autonomic systems, resulting in permanent pain, numbness, and/or weakness of one or several areas of the body; allergic reactions; (i.e.: anaphylactic reaction); and/or death. Furthermore, the patient was informed of those risks and complications associated with the medications. These include, but are not limited to: allergic reactions (i.e.: anaphylactic or anaphylactoid reaction(s)); adrenal axis suppression; blood sugar elevation that in diabetics may result in ketoacidosis or comma; water retention that in patients with history of congestive heart failure may result in shortness of breath, pulmonary edema, and decompensation with resultant heart failure; weight gain; swelling or edema; medication-induced neural toxicity; particulate matter  embolism and blood vessel occlusion with resultant organ, and/or nervous system infarction; and/or aseptic necrosis of one or more joints. Finally, the patient was informed that Medicine is not an exact science; therefore, there is also the possibility of unforeseen or unpredictable risks and/or possible complications that may result in a catastrophic outcome. The patient indicated having understood very clearly. We have given the patient no guarantees and we have made no promises. Enough time was given to the patient to ask questions, all of which were answered to the patient's satisfaction. Ms. Tamura has indicated that she wanted to continue with the procedure. Attestation: I, the ordering provider, attest that I have discussed with the patient the benefits, risks, side-effects, alternatives, likelihood of achieving goals, and potential problems during recovery for the procedure that I have provided informed consent. Date  Time: 07/24/2021 10:32 AM  Pre-Procedure Preparation:  Monitoring: As per clinic protocol. Respiration, ETCO2, SpO2, BP, heart rate and rhythm monitor placed and checked for adequate function Safety Precautions: Patient was assessed for positional comfort and pressure points before starting the procedure. Time-out: I initiated and conducted the "Time-out" before starting the procedure, as per protocol. The patient was asked to participate by confirming the accuracy of the "Time Out" information. Verification of the correct person, site, and procedure were performed and confirmed by me, the nursing staff, and the patient. "Time-out" conducted as per Joint Commission's Universal Protocol (UP.01.01.01). Time: 1100  Description of Procedure:          Area Prepped: Entire shoulder Area DuraPrep (Iodine Povacrylex [0.7% available iodine] and Isopropyl Alcohol, 74% w/w) Safety Precautions: Aspiration looking for blood return was conducted prior to all injections. At no point did we  inject any substances, as a needle was being advanced. No attempts were made at seeking any paresthesias. Safe injection practices and needle disposal techniques used. Medications properly checked for expiration dates. SDV (single dose vial) medications used. Description of the Procedure: Protocol guidelines were followed. The patient was placed in position over the procedure table. The target area was identified and the area prepped in the usual manner. Skin & deeper tissues infiltrated with local anesthetic. Appropriate amount of time allowed to pass for local  anesthetics to take effect. The procedure needles were then advanced to the target area. Proper needle placement secured. Negative aspiration confirmed. Solution injected in intermittent fashion, asking for systemic symptoms every 0.5cc of injectate. The needles were then removed and the area cleansed, making sure to leave some of the prepping solution back to take advantage of its long term bactericidal properties.  Vitals:   07/24/21 1019 07/24/21 1100 07/24/21 1110  BP: 135/65 (!) 178/72 (!) 170/68  Pulse: 70 70 72  Resp: 18 19 18   Temp: (!) 97.2 F (36.2 C)    TempSrc: Temporal    SpO2: 97% 100% 100%  Weight: 186 lb (84.4 kg)    Height: 4\' 10"  (1.473 m)      Start Time: 1100 hrs. End Time: 1106 hrs. Materials:  Needle(s) Type: Spinal Needle Gauge: 22G Length: 3.5-in Medication(s): Please see orders for medications and dosing details.  Imaging Guidance (Non-Spinal):          Type of Imaging Technique: Fluoroscopy Guidance (Non-Spinal) Indication(s): Assistance in needle guidance and placement for procedures requiring needle placement in or near specific anatomical locations not easily accessible without such assistance. Exposure Time: Please see nurses notes. Contrast: Before injecting any contrast, we confirmed that the patient did not have an allergy to iodine, shellfish, or radiological contrast. Once satisfactory needle  placement was completed at the desired level, radiological contrast was injected. Contrast injected under live fluoroscopy. No contrast complications. See chart for type and volume of contrast used. Fluoroscopic Guidance: I was personally present during the use of fluoroscopy. "Tunnel Vision Technique" used to obtain the best possible view of the target area. Parallax error corrected before commencing the procedure. "Direction-depth-direction" technique used to introduce the needle under continuous pulsed fluoroscopy. Once target was reached, antero-posterior, oblique, and lateral fluoroscopic projection used confirm needle placement in all planes. Images permanently stored in EMR. Interpretation: I personally interpreted the imaging intraoperatively. Adequate needle placement confirmed in multiple planes. Appropriate spread of contrast into desired area was observed. No evidence of afferent or efferent intravascular uptake. Permanent images saved into the patient's record.  Antibiotic Prophylaxis:   Anti-infectives (From admission, onward)    Start     Dose/Rate Route Frequency Ordered Stop   07/24/21 1030  ampicillin (OMNIPEN) 2 g in sodium chloride 0.9 % 100 mL IVPB       Note to Pharmacy: Pediatric Recommended Dose: 50 mg/kg   2 g 300 mL/hr over 20 Minutes Intravenous  Once 07/24/21 1020 07/24/21 1102      Indication(s): None identified  Post-operative Assessment:  Post-procedure Vital Signs:  Pulse/HCG Rate: 72  Temp: (!) 97.2 F (36.2 C) Resp: 18 BP: (!) 170/68 SpO2: 100 %  EBL: None  Complications: No immediate post-treatment complications observed by team, or reported by patient.  Note: The patient tolerated the entire procedure well. A repeat set of vitals were taken after the procedure and the patient was kept under observation following institutional policy, for this type of procedure. Post-procedural neurological assessment was performed, showing return to baseline, prior  to discharge. The patient was provided with post-procedure discharge instructions, including a section on how to identify potential problems. Should any problems arise concerning this procedure, the patient was given instructions to immediately contact us, at any time, without hesitation. In any case, we plan to contact the patient by telephone for a follow-up status report regarding this interventional procedure.  Comments:  No additional relevant information.  Plan of Care  Orders:  Orders Placed  This Encounter  Procedures   SUPRASCAPULAR NERVE BLOCK    For shoulder pain.    Scheduling Instructions:     Purpose: Diagnostic     Laterality: Bilateral     Level(s): Suprascapular notch     Sedation: Patient's choice.     Scheduling Timeframe: Today    Order Specific Question:   Where will this procedure be performed?    Answer:   [facility]   DG [facility] C-ARM 1-60 MIN NO REPORT    Intraoperative interpretation by procedural physician at [facility].    Standing Status:   Standing    Number of Occurrences:   1    Order Specific Question:   Reason for exam:    Answer:   Assistance in needle guidance and placement for procedures requiring needle placement in or near specific anatomical locations not easily accessible without such assistance.   Informed Consent Details: Physician/Practitioner Attestation; Transcribe to consent form and obtain patient signature    Note: Always confirm laterality of pain with Ms. Sforza, before procedure.    Order Specific Question:   Physician/Practitioner attestation of informed consent for procedure/surgical case    Answer:   I, the physician/practitioner, attest that I have discussed with the patient the benefits, risks, side effects, alternatives, likelihood of achieving goals and potential problems during recovery for the procedure that I have provided informed consent.    Order Specific Question:   Procedure    Answer:    Suprascapular Nerve Block    Order Specific Question:   Physician/Practitioner performing the procedure    Answer:   Suhayla Chisom A. Dossie Arbour, MD    Order Specific Question:   Indication/Reason    Answer:   Chronic shoulder pain (arthralgia)   Care order/instruction: Please confirm that the patient has stopped the Eliquis (Apixaban) x 3 days prior to procedure or surgery.    Please confirm that the patient has stopped the Eliquis (Apixaban) x 3 days prior to procedure or surgery.    Standing Status:   Standing    Number of Occurrences:   1   Provide equipment / supplies at bedside    "Block Tray" (Disposable  single use) Needle type: SpinalSpinal Amount/quantity: 2 Size: Regular (3.5-inch) Gauge: 22G    Standing Status:   Standing    Number of Occurrences:   1    Order Specific Question:   Specify    Answer:   Block Tray   Monitor O2 SATs   Saline lock IV   Bleeding precautions    Standing Status:   Standing    Number of Occurrences:   1    Chronic Opioid Analgesic:  .   Oxycodone/APAP 7.5/325, 1 tab p.o. daily (# 45) (last filled on 06/02/2021) by Erich Montane, FNP-C, working for Dr. Talmadge Chad, MD. MME/day: 7.5 mg/day   Medications ordered for procedure: Meds ordered this encounter  Medications   lidocaine (XYLOCAINE) 2 % (with pres) injection 400 mg   pentafluoroprop-tetrafluoroeth (GEBAUERS) aerosol   DISCONTD: lactated ringers infusion 1,000 mL   DISCONTD: midazolam (VERSED) 5 MG/5ML injection 0.5-2 mg    Make sure Flumazenil is available in the pyxis when using this medication. If oversedation occurs, administer 0.2 mg IV over 15 sec. If after 45 sec no response, administer 0.2 mg again over 1 min; may repeat at 1 min intervals; not to exceed 4 doses (1 mg)   ropivacaine (PF) 2 mg/mL (0.2%) (NAROPIN) injection 9 mL  methylPREDNISolone acetate (DEPO-MEDROL) injection 80 mg   ampicillin (OMNIPEN) 2 g in sodium chloride 0.9 % 100 mL IVPB    Pediatric  Recommended Dose: 50 mg/kg    Order Specific Question:   Antibiotic Indication:    Answer:   Other Indication (list below)    Order Specific Question:   Other Indication:    Answer:   Procedure Prophylaxis    Medications administered: We administered lidocaine, ropivacaine (PF) 2 mg/mL (0.2%), methylPREDNISolone acetate, and ampicillin (OMNIPEN) 2 g in sodium chloride 0.9 % 100 mL IVPB.  See the medical record for exact dosing, route, and time of administration.  Follow-up plan:   Return in about 2 weeks (around 08/07/2021) for Proc-day (T,Th), (F2F), (PPE).      Interventional Therapies  Risk  Complexity Considerations:   Estimated body mass index is 38.87 kg/m as calculated from the following:   Height as of this encounter: 4\' 10"  (1.473 m).   Weight as of this encounter: 186 lb (84.4 kg). 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 2 g IV prior to procedures)   Planned  Pending:   Diagnostic bilateral suprascapular NB #1 (07/24/2021)    Under consideration:   Diagnostic bilateral suprascapular NB #1 (07/24/2021)  Therapeutic bilateral suprascapular nerve RFA    Completed:   Diagnostic bilateral suprascapular NB x1 (07/24/2021)    Therapeutic  Palliative (PRN) options:   None established     Recent Visits Date Type Provider Dept  06/18/21 Office Visit Milinda Pointer, MD [facility]  04/30/21 Office Visit Milinda Pointer, MD [facility]  Showing recent visits within past 90 days and meeting all other requirements Today's Visits Date Type Provider Dept  07/24/21 Procedure visit Milinda Pointer, MD [facility]  Showing today's visits and meeting all other requirements Future Appointments Date Type Provider Dept  08/12/21 Appointment Milinda Pointer, MD [facility]  Showing future appointments within next 90  days and meeting all other requirements Disposition: Discharge home  Discharge (Date  Time): 07/24/2021; 1113 hrs.   Primary Care Physician: [name], MD Location: [facility] Note by: Gaspar Cola, MD Date: 07/24/2021; Time: 11:28 AM  Disclaimer:  Medicine is not an Chief Strategy Officer. The only guarantee in medicine is that nothing is guaranteed. It is important to note that the decision to proceed with this intervention was based on the information collected from the patient. The Data and conclusions were drawn from the patient's questionnaire, the interview, and the physical examination. Because the information was provided in large part by the patient, it cannot be guaranteed that it has not been purposely or unconsciously manipulated. Every effort has been made to obtain as much relevant data as possible for this evaluation. It is important to note that the conclusions that lead to this procedure are derived in large part from the available data. Always take into account that the treatment will also be dependent on availability of resources and existing treatment guidelines, considered by other Pain Management Practitioners as being common knowledge and practice, at the time of the intervention. For Medico-Legal purposes, it is also important to point out that variation in procedural techniques and pharmacological choices are the acceptable norm. The indications, contraindications, technique, and results of the above procedure should only be interpreted and judged by a Board-Certified Interventional Pain Specialist with extensive familiarity and expertise in the same exact procedure and technique.

## 2021-07-24 ENCOUNTER — Ambulatory Visit (HOSPITAL_BASED_OUTPATIENT_CLINIC_OR_DEPARTMENT_OTHER): Payer: Medicare Other | Admitting: Pain Medicine

## 2021-07-24 ENCOUNTER — Other Ambulatory Visit: Payer: Self-pay

## 2021-07-24 ENCOUNTER — Encounter: Payer: Self-pay | Admitting: Pain Medicine

## 2021-07-24 ENCOUNTER — Ambulatory Visit
Admission: RE | Admit: 2021-07-24 | Discharge: 2021-07-24 | Disposition: A | Discharge status: Home / Self Care | Payer: Medicare Other | Source: Ambulatory Visit | Attending: Pain Medicine | Admitting: Pain Medicine

## 2021-07-24 VITALS — BP 170/68 | HR 72 | Temp 97.2°F | Resp 18 | Ht <= 58 in | Wt 186.0 lb

## 2021-07-24 DIAGNOSIS — G8929 Other chronic pain: Secondary | ICD-10-CM | POA: Diagnosis not present

## 2021-07-24 DIAGNOSIS — M19012 Primary osteoarthritis, left shoulder: Secondary | ICD-10-CM | POA: Diagnosis not present

## 2021-07-24 DIAGNOSIS — Z9981 Dependence on supplemental oxygen: Secondary | ICD-10-CM | POA: Insufficient documentation

## 2021-07-24 DIAGNOSIS — M25512 Pain in left shoulder: Secondary | ICD-10-CM

## 2021-07-24 DIAGNOSIS — M7542 Impingement syndrome of left shoulder: Secondary | ICD-10-CM | POA: Insufficient documentation

## 2021-07-24 DIAGNOSIS — Z953 Presence of xenogenic heart valve: Secondary | ICD-10-CM | POA: Insufficient documentation

## 2021-07-24 DIAGNOSIS — M19011 Primary osteoarthritis, right shoulder: Secondary | ICD-10-CM

## 2021-07-24 DIAGNOSIS — M898X1 Other specified disorders of bone, shoulder: Secondary | ICD-10-CM | POA: Insufficient documentation

## 2021-07-24 DIAGNOSIS — M25511 Pain in right shoulder: Secondary | ICD-10-CM | POA: Diagnosis not present

## 2021-07-24 DIAGNOSIS — Z950 Presence of cardiac pacemaker: Secondary | ICD-10-CM | POA: Insufficient documentation

## 2021-07-24 DIAGNOSIS — Z7901 Long term (current) use of anticoagulants: Secondary | ICD-10-CM | POA: Insufficient documentation

## 2021-07-24 DIAGNOSIS — J42 Unspecified chronic bronchitis: Secondary | ICD-10-CM

## 2021-07-24 MED ORDER — LACTATED RINGERS IV SOLN: 1000.0000 mL | Freq: Once | INTRAVENOUS | Status: DC

## 2021-07-24 MED ORDER — PENTAFLUOROPROP-TETRAFLUOROETH EX AERO
INHALATION_SPRAY | Freq: Once | CUTANEOUS | Status: DC
  Filled 2021-07-24: qty 116

## 2021-07-24 MED ORDER — SODIUM CHLORIDE 0.9 % IV SOLN
2.0000 g | Freq: Once | INTRAVENOUS | Status: AC
  Administered 2021-07-24: 11:00:00 2 g via INTRAVENOUS
  Filled 2021-07-24: qty 2000

## 2021-07-24 MED ORDER — ROPIVACAINE HCL 2 MG/ML IJ SOLN
9.0000 mL | Freq: Once | INTRAMUSCULAR | Status: AC
  Administered 2021-07-24: 11:00:00 9 mL via PERINEURAL

## 2021-07-24 MED ORDER — MIDAZOLAM HCL 5 MG/5ML IJ SOLN: 0.5000 mg | Freq: Once | INTRAMUSCULAR | Status: DC

## 2021-07-24 MED ORDER — METHYLPREDNISOLONE ACETATE 80 MG/ML IJ SUSP
80.0000 mg | Freq: Once | INTRAMUSCULAR | Status: AC
  Administered 2021-07-24: 11:00:00 80 mg
  Filled 2021-07-24: qty 1

## 2021-07-24 MED ORDER — LIDOCAINE HCL 2 % IJ SOLN
20.0000 mL | Freq: Once | INTRAMUSCULAR | Status: AC
  Administered 2021-07-24: 11:00:00 200 mg
  Filled 2021-07-24: qty 20

## 2021-07-24 MED ORDER — ROPIVACAINE HCL 2 MG/ML IJ SOLN
INTRAMUSCULAR | Status: AC
  Filled 2021-07-24: qty 20

## 2021-07-25 ENCOUNTER — Telehealth: Payer: Self-pay | Admitting: *Deleted

## 2021-07-25 NOTE — Telephone Encounter (Signed)
No problems post procedure. 

## 2021-07-27 NOTE — Progress Notes (Deleted)
Patient ID: ALISEN MARSIGLIA, female    DOB: Mar 06, 1946, 75 y.o.   MRN: [id number]  HPI  Ms Honeyman is a 75 y/o female with a history of hyperlipidemia, HTN, thyroid disease, COPD, left ear deafness, atrial fibrillation, previous tobacco use and chronic heart failure.   Echo report from 05/16/21 reviewed and showed an EF of 45% along with mild LVH. Echo report from 02/07/20 reviewed and showed an EF of >55% along with severe LAE. Catheterization done 02/07/20 but unable to view the results.   Was in the ED 03/17/21 due to shortness of breath where she was evaluated and released.   She presents today for a follow-up visit with a chief complaint of   Past Medical History:  Diagnosis Date   Aortic 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 s/p bioprosthetic valve replacement 2008 at [facility];  b. 01/2015 Echo: EF 60-65%, no rwma, Gr1 DD, mildly dil LA, nl RV fxn.   Atrial fibrillation with RVR ([place]) 01/11/2015   Atrial flutter ([place])    a. 08/2016 s/p DCCV.  Remains on flecainide 50 mg bid.   Basal skull fracture ([name]) 20 yrs ago   CHF (congestive heart failure) ([name])    Chronic respiratory failure ([name])    COPD (chronic obstructive pulmonary disease) ([place])    a. 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 2008 prior to Aortic aneurysm repair-->nl cors.   History of stress test    a. 10/2015 MV: no ischemia/infarct.   HLD (hyperlipidemia)    HTN (hypertension)    Hypothyroidism    Obesity    PAF (paroxysmal atrial fibrillation) ([name])    a. on Eliquis; b. CHADS2VASc = 3 (HTN, age x 7, female).   Paroxysmal atrial fibrillation ([place]) 01/24/2015   Right upper quadrant abdominal tenderness without rebound tenderness 03/02/2018   S/P ascending aortic aneurysm repair 2008   Past Surgical History:  Procedure Laterality Date   ABDOMINAL AORTIC ANEURYSM REPAIR  2008   ABDOMINAL HYSTERECTOMY      AORTIC VALVE REPLACEMENT  2008   CARDIAC CATHETERIZATION     [facility]   CARDIOVERSION N/A 08/15/2018   Procedure: CARDIOVERSION;  Surgeon: [name], MD;  Location: [facility] OR[facility];  Service: Cardiovascular;  Laterality: N/A;   CARDIOVERSION N/A 11/14/2018   Procedure: CARDIOVERSION (CATH LAB);  Surgeon: [name], MD;  Location: [facility];  Service: Cardiovascular;  Laterality: N/A;   CARDIOVERSION N/A 06/05/2019   Procedure: CARDIOVERSION;  Surgeon: [name], MD;  Location: [facility];  Service: Cardiovascular;  Laterality: N/A;   CARDIOVERSION N/A 08/14/2019   Procedure: CARDIOVERSION;  Surgeon: [name], MD;  Location: [place] [facility];  Service: Cardiovascular;  Laterality: N/A;   CARDIOVERSION N/A 11/09/2019   Procedure: CARDIOVERSION;  Surgeon: Isaias Cowman, MD;  Location: [place] [facility];  Service: Cardiovascular;  Laterality: N/A;   CARDIOVERSION N/A 01/17/2020   Procedure: CARDIOVERSION;  Surgeon: Isaias Cowman, MD;  Location: [facility];  Service: Cardiovascular;  Laterality: N/A;   CARPAL TUNNEL RELEASE     ELECTROPHYSIOLOGIC STUDY N/A 08/17/2016   Procedure: Cardioversion;  Surgeon: [name], MD;  Location: [facility];  Service: Cardiovascular;  Laterality: N/A;   TUMOR EXCISION Left    x3 (arm)   Family History  Problem Relation Age of Onset   Stroke  Father    Stroke Paternal Grandmother    Social History   Tobacco Use   Smoking status: Former    Types: Cigarettes   Smokeless tobacco: Never  Substance Use Topics   Alcohol use: No   Allergies  Allergen Reactions   Benadryl [Diphenhydramine Hcl (Sleep)] Palpitations   Cetirizine Palpitations   Lasix [Furosemide] Rash   Levaquin [Levofloxacin In D5w] Other (See Comments)    Reaction:  Fatigue and muscle soreness   Meloxicam Rash   Soy Allergy Hives and Nausea And Vomiting   Sulfa Antibiotics Rash    Review of Systems  Constitutional:  Positive for appetite change (decreased) and fatigue (tire  easily).  HENT:  Positive for hearing loss. Negative for congestion and sore throat.   Eyes: Negative.   Respiratory:  Positive for cough and shortness of breath. Negative for wheezing.   Cardiovascular:  Negative for chest pain, palpitations and leg swelling.  Gastrointestinal:  Positive for abdominal pain and nausea. Negative for abdominal distention.  Endocrine: Negative.   Genitourinary: Negative.   Musculoskeletal:  Positive for arthralgias (bilateral shoulder pain). Negative for back pain.  Skin: Negative.   Allergic/Immunologic: Negative.   Neurological:  Positive for light-headedness (at times). Negative for dizziness.  Hematological:  Negative for adenopathy. Does not bruise/bleed easily.  Psychiatric/Behavioral:  Positive for dysphoric mood and sleep disturbance (trouble sleeping due to shoulder pain; wearing cpap at bedtime). The patient is not nervous/anxious.      Physical Exam Vitals and nursing note reviewed.  Constitutional:      Appearance: Normal appearance.  HENT:     Head: Normocephalic and atraumatic.     Left Ear: Decreased hearing noted.  Cardiovascular:     Rate and Rhythm: Normal rate and regular rhythm.  Pulmonary:     Effort: Pulmonary effort is normal. No respiratory distress.     Breath sounds: No wheezing or rales.  Abdominal:     General: There is no distension.     Palpations: Abdomen is soft.  Musculoskeletal:     Cervical back: Normal range of motion and neck supple.     Right lower leg: No edema.     Left lower leg: No edema.  Skin:    General: Skin is warm and dry.  Neurological:     General: No focal deficit present.     Mental Status: She is alert and oriented to person, place, and time.  Psychiatric:        Mood and Affect: Mood normal.        Behavior: Behavior normal.        Thought Content: Thought content normal.   Assessment & Plan:  1: Chronic heart failure with mildly reduced ejection fraction- - NYHA class III -  euvolemic today - weighing daily; reminded to call for an overnight weight gain of >2 pounds or a weekly weight gain of >5 pounds - weight 180.2 from last visit here 1 month ago - not adding salt and is using pepper for seasoning; reviewed the importance of closely following a low sodium diet  - saw cardiothoracic provider Ysidro Evert) 09/04/20 for aortic valve - on GDMT of lisinopril & carvedilol  - has had previous valve surgery; telemedicine visit with cardiothoracic surgeon ([place]) 07/23/21 - BNP 03/17/21 was 267.5  2: HTN- - BP  - saw PCP Stephanie Coup) 04/29/21 - BMP 06/27/21 reviewed and showed sodium 136, potassium 3.6, creatinine 0.98 and GFR >60  3: severe COPD- - wearing oxygen at 3.5L  around the clock at home; when she comes to appointments, she has to put her portable tank on 2L or it'll run out - saw pulmonology Humphrey Rolls) 05/13/21  4: Atrial fibrillation-  - dual chamber pacemaker placed June 2021 - has had multiple cardioversions - saw cardiology ([place]) 05/19/21   Medication list was reviewed.

## 2021-07-28 ENCOUNTER — Ambulatory Visit: Payer: Medicare Other | Admitting: Family

## 2021-07-29 NOTE — Progress Notes (Deleted)
Chronic Care Management Pharmacy Note  07/29/2021 Name:  [name] MRN:  [id number] DOB:  May 30, 1946  Summary: ***  Recommendations/Changes made from today's visit: ***  Plan: ***   Subjective: [name] is an 75 y.o. year old female who is a primary patient of Humphrey Rolls, Timoteo Gaul, MD.  The CCM team was consulted for assistance with disease management and care coordination needs.    Engaged with patient by telephone for initial visit in response to provider referral for pharmacy case management and/or care coordination services.   Consent to Services:  The patient was given the following information about Chronic Care Management services today, agreed to services, and gave verbal consent: 1. CCM service includes personalized support from designated clinical staff supervised by the primary care provider, including individualized plan of care and coordination with other care providers 2. 24/7 contact phone numbers for assistance for urgent and routine care needs. 3. Service will only be billed when office clinical staff spend 20 minutes or more in a month to coordinate care. 4. Only one practitioner may furnish and bill the service in a calendar month. 5.The patient may stop CCM services at any time (effective at the end of the month) by phone call to the office staff. 6. The patient will be responsible for cost sharing (co-pay) of up to 20% of the service fee (after annual deductible is met). Patient agreed to services and consent obtained.  Patient Care Team: [name], MD as PCP - General (Internal Medicine) [name], MD as PCP - Cardiology (Cardiology) Edythe Clarity, [facility] as Pharmacist (Pharmacist)  Recent office visits: 07/15/21: Jonetta Osgood, NP. For follow up. No med changes 05/13/21 Dr. Humphrey Rolls For follow-up. No medication changes.  04/29/21 Dr. Humphrey Rolls For follow-up. No medication changes. 04/01/21 Dr. Humphrey Rolls For COPD. No medication changes.  03/11/21 Dr. Humphrey Rolls For  hospitalization follow-up. STARTED Albuterol 108 MCG/ACT 01/20/21 Jonetta Osgood, NP. For acute visit. STARTED Psyllium 58.6% 1 packet daily, mix with water/juice.   Recent consult visits: 07/24/21 Pain Clinic Milinda Pointer, MD. For pain, pain block injection given 07/23/21 Cardiology Joretta Bachelor MD. For follow up, no med changes 07/22/21 Janace Litten, Darren, MD. For IBS. STARTED dicyclomine 20m TID as needed 06/27/21 Oncology BCammie Sickle MD. For symptomatic anemia. No medication changes.  06/23/21 Cardiology HDarylene Price ALoni Muse FAlgood For chronic systolic heart failure. No medication changes.  06/18/21 Pain Clinic NMilinda Pointer MD. For neck pain. No medication changes.  9/15/222 Cardiology HDarylene Price ALoni Muse FSoper For chronic systolic heart failure. No medication changes.  05/19/21 PIsaias Cowman MD For follow-Up. Per note:carvedilol 3.125 mg twice daily 05/09/21 Orthopedic Surgeon Poggi, JSmith Minceand MLattie CornsFor primary osteoarthritis. No information given.   04/30/21 Pain Clinic NMilinda Pointer MD. For shoulder pain. No medication changes.  04/15/21 Cardiology  Paraschos, ASheppard Coil MD For follow-Up. No medication changes. 04/04/21 Oncology BCammie Sickle MD. For symptomatic anemia. No medication changes.  03/14/21 Oncology AVerlon Au NP. For new patient. No medication changes.  01/03/21 Orthopedic Surgeon Poggi, JSmith Minceand MLattie CornsFor primary osteoarthritis. No information given.   [facility] dyspnea on exertion. No medication changes.  [facility] AJennye Boroughs MD. For symptomatic anemia. No medication changes.   Objective:  Lab Results  Component Value Date   CREATININE 0.98 06/27/2021   BUN 20 06/27/2021   GFRNONAA >60  06/27/2021   GFRAA >60 05/14/2020   NA 136 06/27/2021   K 3.6  06/27/2021   CALCIUM 8.7 (L) 06/27/2021   CO2 29 06/27/2021   GLUCOSE 106 (H) 06/27/2021    Lab Results  Component Value Date/Time   HGBA1C 5.7 (H) 07/17/2019 09:28 AM    Last diabetic Eye exam: No results found for: HMDIABEYEEXA  Last diabetic Foot exam: No results found for: HMDIABFOOTEX   Lab Results  Component Value Date   CHOL 142 11/25/2020   HDL 54 11/25/2020   LDLCALC 59 11/25/2020   TRIG 174 (H) 11/25/2020    Hepatic Function Latest Ref Rng & Units 06/27/2021 04/30/2021 03/14/2021  Total Protein 6.5 - 8.1 g/dL 6.9 6.4 6.9  Albumin 3.5 - 5.0 g/dL 3.8 4.1 3.7  AST 15 - 41 U/L 18 18 17   ALT 0 - 44 U/L 16 - 13  Alk Phosphatase 38 - 126 U/L 79 98 66  Total Bilirubin 0.3 - 1.2 mg/dL 0.5 <0.2 0.5    Lab Results  Component Value Date/Time   TSH 2.406 03/14/2021 03:32 PM   TSH 3.980 11/25/2020 11:02 AM   TSH 3.220 07/17/2019 09:28 AM   FREET4 1.20 11/25/2020 11:02 AM   FREET4 1.49 07/17/2019 09:28 AM    CBC Latest Ref Rng & Units 06/27/2021 04/04/2021 03/17/2021  WBC 4.0 - 10.5 K/uL 6.4 7.3 7.4  Hemoglobin 12.0 - 15.0 g/dL 12.0 9.9(L) 9.9(L)  Hematocrit 36.0 - 46.0 % 38.0 32.5(L) 32.5(L)  Platelets 150 - 400 K/uL 243 326 350    Lab Results  Component Value Date/Time   VD25OH 25.5 (L) 11/25/2020 11:02 AM   VD25OH 32.8 07/17/2019 09:28 AM    Clinical ASCVD: Yes  The 10-year ASCVD risk score (Arnett DK, et al., 2019) is: 27%   Values used to calculate the score:     Age: 14 years     Sex: Female     Is Non-Hispanic African American: No     Diabetic: No     Tobacco smoker: No     Systolic Blood Pressure: 297 mmHg     Is BP treated: Yes     HDL Cholesterol: 54 mg/dL     Total Cholesterol: 142 mg/dL    Depression screen [facility] 2/9 07/24/2021 07/15/2021 04/29/2021  Decreased Interest 0 0 3  Down, Depressed, Hopeless 0 0 3  PHQ - 2 Score 0 0 6  Altered sleeping - - -  Tired, decreased energy - - -  Change in appetite - - -  Feeling bad or failure about yourself   - - -  Trouble concentrating - - -  Moving slowly or fidgety/restless - - -  Suicidal thoughts - - -  PHQ-9 Score - - -  Difficult doing work/chores - - -  Some recent data might be hidden      Social History   Tobacco Use  Smoking Status Former   Types: Cigarettes  Smokeless Tobacco Never   BP Readings from Last 3 Encounters:  07/24/21 (!) 170/68  07/22/21 113/73  07/15/21 123/60   Pulse Readings from Last 3 Encounters:  07/24/21 72  07/22/21 (!) 52  07/15/21 72   Wt Readings from Last 3 Encounters:  07/24/21 186 lb (84.4 kg)  07/22/21 183 lb (83 kg)  07/15/21 183 lb 9.6 oz (83.3 kg)   BMI Readings from Last 3 Encounters:  07/24/21 38.87 kg/m  07/22/21 38.25 kg/m  07/15/21 38.37 kg/m    Assessment/Interventions: Review of patient  past medical history, allergies, medications, health status, including review of consultants reports, laboratory and other test data, was performed as part of comprehensive evaluation and provision of chronic care management services.   SDOH:  (Social Determinants of Health) assessments and interventions performed: Yes  SDOH Screenings   Alcohol Screen: Low Risk    Last Alcohol Screening Score (AUDIT): 0  Depression (PHQ2-9): Low Risk    PHQ-2 Score: 0  Financial Resource Strain: Not on file  Food Insecurity: Not on file  Housing: Not on file  Physical Activity: Not on file  Social Connections: Not on file  Stress: Not on file  Tobacco Use: Medium Risk   Smoking Tobacco Use: Former   Smokeless Tobacco Use: Never   Passive Exposure: Not on file  Transportation Needs: Not on file    CCM Care Plan  Allergies  Allergen Reactions   Benadryl [Diphenhydramine Hcl (Sleep)] Palpitations   Cetirizine Palpitations   Lasix [Furosemide] Rash   Levaquin [Levofloxacin In D5w] Other (See Comments)    Reaction:  Fatigue and muscle soreness   Meloxicam Rash   Soy Allergy Hives and Nausea And Vomiting   Sulfa Antibiotics Rash     Medications Reviewed Today     Reviewed by Milinda Pointer, MD (Physician) on 07/24/21 at 1128  Med List Status: <None>   Medication Order Taking? Sig Documenting Provider Last Dose Status Informant  ALPRAZolam (XANAX) 0.25 MG tablet [id number] Yes Take 1 tablet (0.25 mg total) by mouth 2 (two) times daily as needed for anxiety. [name], MD Taking Active   amLODipine (NORVASC) 10 MG tablet [id number] Yes Take 1 tablet by mouth daily. [provider] Taking Active   atorvastatin (LIPITOR) 40 MG tablet [id number] Yes 1 tab po qhs Jonetta Osgood, NP Taking Active   bumetanide (BUMEX) 1 MG tablet [id number] Yes TAKE 2 TABLETS BY MOUTH TWICE DAILY Alisa Graff, FNP Taking Active   Calcium Carbonate-Vitamin D (CALCIUM 600+D PO) [id number] Yes Take 1 tablet by mouth daily. [provider] Taking Active Self  carvedilol (COREG) 3.125 MG tablet [id number] Yes Take 3.125 mg by mouth 2 (two) times daily with a meal. [provider] Taking Active   Coenzyme Q10 (COQ10) 200 MG CAPS [id number] Yes Take 200 mg by mouth daily. [provider] Taking Active Self  diclofenac Sodium (VOLTAREN) 1 % GEL [id number] Yes SMARTSIG:Gram(s) Topical Twice Daily [provider] Taking Active Self  dicyclomine (BENTYL) 10 MG capsule [id number] Yes Take 1 capsule (10 mg total) by mouth 3 (three) times daily as needed for spasms. Lucilla Lame, MD Taking Active   ELIQUIS 5 MG TABS tablet [id number] Yes Take 1 tablet by mouth twice daily.  Patient taking differently: Take 5 mg by mouth 2 (two) times daily.   [name], MD Taking Active   ferrous sulfate 325 (65 FE) MG tablet [id number] Yes Take 2 tablets (650 mg total) by mouth 2 (two) times daily with a meal.  Patient taking differently: Take 325 mg by mouth. Takes 4 tablets by mouth twice a day   Jennye Boroughs, MD Taking Active   flecainide (TAMBOCOR) 50 MG tablet [id number] Yes Take 1 tablet (50 mg total) by mouth  2 (two) times daily. PLEASE CONTACT OFFICE [phone] TO SCHEDULE APPOINTMENT FOR FUTURE REFILLS Deboraha Sprang, MD Taking Active Self  levothyroxine (SYNTHROID) 25 MCG tablet [id number] Yes Take 1 tablet by mouth daily before breakfast. [name], MD Taking Active   lisinopril (ZESTRIL) 40 MG  tablet [id number] Yes Take 1 tablet (40 mg total) by mouth daily. Alisa Graff, FNP Taking Active            Med Note Thomasenia Bottoms Jan 29, 2021  9:40 PM) Pt had today at 1100  loratadine (CLARITIN) 10 MG tablet [id number] Yes Take 10 mg by mouth daily. [provider] Taking Active Self  oxyCODONE-acetaminophen (PERCOCET) 7.5-325 MG tablet [id number] Yes Take one tab in am and half in pm for severe shoulder pain Abernathy, [name], NP Taking Active   pantoprazole (PROTONIX) 40 MG tablet [id number] Yes Take 1 tablet by mouth daily. [name], MD Taking Active   potassium chloride (KLOR-CON) 10 MEQ tablet [id number] Yes Take 4 tablets (40 mEq total) by mouth 2 (two) times daily. [name], MD Taking Active Self  sertraline (ZOLOFT) 50 MG tablet [id number] Yes Take 1 tablet (50 mg total) by mouth daily. [name], MD Taking Active   traZODone (DESYREL) 50 MG tablet [id number] Yes Take 1 tablet by mouth at bedtime. [name], MD Taking Active   TRELEGY ELLIPTA 100-62.5-25 MCG/INH AEPB [id number]  INHALE 1 PUFF INTO THE LUNGS DAILY [name], MD  Active   VENTOLIN HFA 108 [phone] Base) MCG/ACT inhaler [id number] Yes INHALE 2 PUFFS INTO THE LUNGS EVERY 6 HOURS AS NEEDED FOR WHEEZING OR SHORTNESS OF Laurey Arrow, NP Taking Active   Med List Note Janett Billow, [place] 06/23/21 3893): UDS 04-30-2021 Clearance to stop Eliquis sent to Dr Saralyn Pilar via Epic  inbox 06/18/21 LP faxed as well. Approved 06/23/21 LP             Patient Active Problem List   Diagnosis Date Noted   Long term prescription benzodiazepine use 04/30/2021   Chronic shoulder pain (1ry area  of Pain) (Bilateral) (L>R) 04/30/2021   Chronic upper extremity pain (2ry area of Pain) (Bilateral) (L>R) 04/30/2021   Cervicalgia 04/30/2021   Chronic neck pain (3ry area of Pain) (Posterior) (Bilateral) (L>R) 04/30/2021   Shoulder blade pain (4th area of Pain) (Left) 04/30/2021   Osteoarthritis of glenohumeral joint (Left) 04/30/2021   Osteoarthritis of AC (acromioclavicular) joint (Left) 04/30/2021   Chronic headaches (5th area of Pain) 04/30/2021   Osteoarthritis of glenohumeral joints (Bilateral) 04/30/2021   Osteoarthritis of acromioclavicular joints (Bilateral) 04/30/2021   Primary osteoarthritis of shoulders (Bilateral) 04/30/2021   DDD (degenerative disc disease), cervical 04/30/2021   Cervical radiculitis (Left) 04/30/2021   Cervical radiculopathy (Left) 04/30/2021   C6 radiculopathy (Left) 04/30/2021   C7 radiculopathy (Left) 04/30/2021   Wheelchair bound 04/30/2021   Chronic pain syndrome 04/29/2021   Pharmacologic therapy 04/29/2021   Disorder of skeletal system 04/29/2021   Problems influencing health status 04/29/2021   Chronic anticoagulation (Eliquis) 03/14/2021   Hx of atrioventricular node ablation 03/14/2021   Symptomatic anemia 01/29/2021   Acute on chronic diastolic CHF (congestive heart failure) ([place]) 05/11/2020   COPD (chronic obstructive pulmonary disease) ([name])    Hypothyroidism    Cardiac pacemaker in situ 05/10/2020   Acute non-recurrent frontal sinusitis 04/22/2020   Vertigo 04/22/2020   S/P placement of cardiac pacemaker 02/21/2020   Atrial fibrillation status post cardioversion ([place]) 11/16/2019   Episode of moderate major depression ([place]) 10/10/2019   Persistent atrial fibrillation ([place])    Encounter for general adult medical examination with abnormal findings 07/10/2019   Encounter for screening mammogram for malignant neoplasm of breast 07/10/2019   Atopic dermatitis 05/02/2019   Paroxysmal atrial  flutter ([place]) 08/11/2018   Encounter for  long-term (current) use of medications 07/09/2018   Chronic left shoulder pain 07/09/2018   Conjunctivitis 07/09/2018   Oxygen dependent 07/09/2018   GAD (generalized anxiety disorder) 07/09/2018   Ovarian failure 07/09/2018   Positive colorectal cancer screening using Cologuard test 04/24/2018   Calculus of gallbladder with acute on chronic cholecystitis 04/08/2018   H/O: CVA (cerebrovascular accident) 04/08/2018   Dysuria 03/02/2018   Urinary tract infection without hematuria 03/02/2018   Right upper quadrant abdominal tenderness without rebound tenderness 03/02/2018   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 exacerbation ([place]) 03/02/2018   Chronic obstructive pulmonary disease ([place]) 09/19/2017   Typical atrial flutter ([name])    Irritable bowel syndrome without diarrhea 01/29/2015   Essential hypertension 01/24/2015   Paroxysmal atrial fibrillation ([place]) 01/24/2015   History of aortic valve replacement with bioprosthetic valve 01/24/2015   Atrial fibrillation with RVR ([place]) 01/11/2015   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 05/18/2014   Shoulder impingement syndrome (Left) 05/18/2014   Cellulitis and abscess 02/13/2014   MRSA (methicillin resistant Staphylococcus aureus) 02/13/2014   Aneurysm, ascending aorta 06/23/2013   Bicuspid aortic valve 06/23/2013    Immunization History  Administered Date(s) Administered   Influenza Inj Mdck Quad Pf 06/10/2018, 06/09/2019   Influenza, High Dose Seasonal PF 06/21/2017   Influenza-Unspecified 06/21/2017, 06/12/2020, 07/01/2021   PFIZER(Purple Top)SARS-COV-2 Vaccination 10/27/2019, 11/17/2019, 07/03/2020   Pneumococcal Conjugate-13 10/07/2017    Conditions to be addressed/monitored:  Hypertension, Atrial Fibrillation, Heart Failure, COPD, Hypothyroidism, Depression, Anxiety, Osteoarthritis, and Anemia, IBS  There are no care plans that you recently modified to display for this patient.    Medication Assistance:  {MEDASSISTANCEINFO:25044}  Compliance/Adherence/Medication fill history: Care Gaps: Shingles, Pneumonia Vaccine  Star-Rating Drugs: Atorvastatin 40 mg 06/14/21 30 DS Lisinopril 40 mg 06/29/21 90 DS    Patient's preferred pharmacy is:  [id number] [address], [address] [place] [address] Phone: [phone] Fax: [phone]  [facility], [address] [place] [address] Phone: [phone] Fax: [phone]  Uses pill box? {Yes or If no, why not?:20788} Pt endorses ***% compliance  We discussed: {Pharmacy options:24294} Patient decided to: {US Pharmacy Plan:23885}  Care Plan and Follow Up Patient Decision:  {FOLLOWUP:24991}  Plan: {CM FOLLOW UP OACZ:66063}  Current Barriers:  {pharmacybarriers:24917}  Pharmacist Clinical Goal(s):  Patient will {PHARMACYGOALCHOICES:24921} through collaboration with PharmD and provider.   Interventions: 1:1 collaboration with [name], MD regarding development and update of comprehensive plan of care as evidenced by provider attestation and co-signature Inter-disciplinary care team collaboration (see longitudinal plan of care) Comprehensive medication review performed; medication list updated in electronic medical record  Hypertension (BP goal <130/80) -Uncontrolled -Current treatment: Amlodipine 42m daily Bumetanide 11m 2 tabs twice daily Carvedilol 3.12526mwice daily Lisinopril 1m55mily -Medications previously tried: Diltiazem, Hydralazine, Lisinopril-HCTZ, Propranolol, Valsartan  -Current home readings: *** -Current dietary habits: *** -Current exercise habits: *** -{ACTIONS;DENIES/REPORTS:21021675::"Denies"} hypotensive/hypertensive symptoms -Educated on {CCM BP Counseling:25124} -Counseled to monitor BP at home ***, document, and provide log at future  appointments -{CCMPHARMDINTERVENTION:25122}  Atrial Fibrillation (Goal: prevent stroke and major bleeding) -Controlled -CHADSVASC: 5 -Current treatment: Rate control: Carvedilol 3.125mg34mly with meal, Flecanide 50mg 105me daily Anticoagulation: Eliquis 5mg 1 23m twice daily -Medications previously tried: None noted -Home BP and HR readings: ***  -Counseled on {CCMAFIBCOUNSELING:25120} -{CCMPHARMDINTERVENTION:25122}  Heart Failure (  Goal: manage symptoms and prevent exacerbations) -{US controlled/uncontrolled:25276} -Last ejection fraction: 60-65% (Date: 08/12/2017) -HF type: Diastolic -NYHA Class: {CHL HP Upstream Pharm NYHA Class:8107623096} -AHA HF Stage: {CHL HP Upstream Pharm AHA HF Stage:6616488290} -Current treatment: Bumex 73m (2 tabs twice daily) Atorvastatin 437mdaily  Carvedilol 3.12572mwice daily Lisinopril 52m45mily -Medications previously tried: None noted  -Current home BP/HR readings: *** -Current dietary habits: *** -Current exercise habits: *** -Educated on {CCM HF Counseling:25125} -{CCMPHARMDINTERVENTION:25122}  COPD (Goal: control symptoms and prevent exacerbations) -{US controlled/uncontrolled:25276} -Current treatment  Trelegy 100-62.5-25 one puff daily Ventolin HFA 2 puffs every 6 hours as needed -Medications previously tried: None noted  -Exacerbations requiring treatment in last 6 months: 0 -Patient {Actions; denies-reports:120008} consistent use of maintenance inhaler -Frequency of rescue inhaler use: *** -Counseled on {CCMINHALERCOUNSELING:25121} -{CCMPHARMDINTERVENTION:25122}  Depression/Anxiety (Goal: controlled mood) -{US controlled/uncontrolled:25276} -Current treatment: Sertraline 50mg24mly Alprazolam 0.25mg 68mice daily as needed for anxiety -Medications previously tried/failed: none noted -PHQ9: *** -GAD7: *** -Educated on {CCM mental health counseling:25127} -{CCMPHARMDINTERVENTION:25122}  Hypothyroidism (Goal: TSH  WNL) -Controlled -Current treatment  Levothyroxine 25mcg 58m daily -Medications previously tried: none noted  -{CCMPHARMDINTERVENTION:25122}  Osteoarthritis/Chronic Pain (Goal: Pain free or unlimited activity) -{US controlled/uncontrolled:25276} -Current treatment  Oxycodone-acetaminophen 7.5-325mg once tab in morning and 1/2 tab in evening Diclofenac sodium 1% gel twice daily -Medications previously tried: none noted  -{CCMPHARMDINTERVENTION:25122}  IBS (Goal: Regular bowel movements) -{US controlled/uncontrolled:25276} -Current treatment  Dicyclomine 10mg th65mtimes daily as needed for spasm Pantoprazole 52mg 1 t45maily -Medications previously tried: none noted  -{CCMPHARMDINTERVENTION:25122}  Anemia (Goal: CBC WNL) -Controlled -Current treatment  Ferrous Sulfate 325mg -Med79mions previously tried: none noted  -{CCMPHARMDINTERVENTION:25122}  Patient Goals/Self-Care Activities Patient will:  - {pharmacypatientgoals:24919}  Follow Up Plan: {CM FOLLOW UP PLAN:22241XNTZ:00174}

## 2021-07-30 ENCOUNTER — Telehealth: Payer: Medicare Other

## 2021-08-04 ENCOUNTER — Telehealth: Payer: Self-pay

## 2021-08-04 ENCOUNTER — Ambulatory Visit: Payer: Medicare Other | Admitting: Student-PharmD

## 2021-08-04 ENCOUNTER — Other Ambulatory Visit: Payer: Self-pay

## 2021-08-04 DIAGNOSIS — F411 Generalized anxiety disorder: Secondary | ICD-10-CM

## 2021-08-04 DIAGNOSIS — I5032 Chronic diastolic (congestive) heart failure: Secondary | ICD-10-CM

## 2021-08-04 DIAGNOSIS — G8929 Other chronic pain: Secondary | ICD-10-CM

## 2021-08-04 DIAGNOSIS — I10 Essential (primary) hypertension: Secondary | ICD-10-CM

## 2021-08-04 DIAGNOSIS — M25512 Pain in left shoulder: Secondary | ICD-10-CM

## 2021-08-04 DIAGNOSIS — D638 Anemia in other chronic diseases classified elsewhere: Secondary | ICD-10-CM

## 2021-08-04 DIAGNOSIS — R197 Diarrhea, unspecified: Secondary | ICD-10-CM

## 2021-08-04 DIAGNOSIS — J449 Chronic obstructive pulmonary disease, unspecified: Secondary | ICD-10-CM

## 2021-08-04 DIAGNOSIS — I480 Paroxysmal atrial fibrillation: Secondary | ICD-10-CM

## 2021-08-04 MED ORDER — OXYCODONE-ACETAMINOPHEN 7.5-325 MG PO TABS: ORAL_TABLET | ORAL | 0 refills | Status: DC

## 2021-08-04 NOTE — Progress Notes (Signed)
Chronic Care Management Pharmacy Note  08/04/2021 Name:  [name] MRN:  [id number] DOB:  Feb 02, 1946  Summary: Patient reports having morning systolic blood pressures in 190s. Latest BP reading was 197/90 on 08/03/21. Will consult PCP about potential therapy changes Patient reports that breathing Andrea doing well, pain does occasionally wake her up at night and she will take an acetaminophen 556m tablet. Patient reports continued stomach pain and bloating despite addition of dicyclomine  Recommendations/Changes made from today's visit: Recommended taking an acetaminophen 5013mat bedtime with other medications to help limit waking up from pain at night Recommended patient try switching ferrous sulfate to ferrous gluconate to aid with stomach problems Will consult PCP about potential BP therapy change  Plan: F/U in 3 months   Subjective: [name] an [age].o. year old female who Andrea a primary patient of KhHumphrey RollsFoTimoteo GaulMD.  The CCM team was consulted for assistance with disease management and care coordination needs.    Engaged with patient face to face for initial visit in response to provider referral for pharmacy case management and/or care coordination services.   Consent to Services:  The patient was given the following information about Chronic Care Management services today, agreed to services, and gave verbal consent: 1. CCM service includes personalized support from designated clinical staff supervised by the primary care provider, including individualized plan of care and coordination with other care providers 2. 24/7 contact phone numbers for assistance for urgent and routine care needs. 3. Service will only be billed when office clinical staff spend 20 minutes or more in a month to coordinate care. 4. Only one practitioner may furnish and bill the service in a calendar month. 5.The patient may stop CCM services at any time (effective at the end of the month) by phone  call to the office staff. 6. The patient will be responsible for cost sharing (co-pay) of up to 20% of the service fee (after annual deductible Andrea met). Patient agreed to services and consent obtained.  Patient Care Team: KhLavera GuiseMD as PCP - General (Internal Medicine) ArWellington HampshireMD as PCP - Cardiology (Cardiology) HaAlena BillsRPSame Day Surgicare Of [facility] Incs Pharmacist (Pharmacist)  Recent office visits: 07/15/21 AbJonetta OsgoodNP. For follow-up. No medication changes 05/13/21 [name] follow-up. No medication changes.  04/29/21 [name] follow-up. No medication changes. 04/01/21 [name] COPD. No medication changes.  03/11/21 [name] hospitalization follow-up. STARTED Albuterol 108 MCG/ACT 01/20/21 AbJonetta OsgoodNP. For acute visit. STARTED Psyllium 58.6% 1 packet daily, mix with water/juice.   Recent consult visits: 06/30/21 Cardiology DrClabe SealPA-C. For blood pressure. Phone call. INCREASED carvedilol to 6.2560mwice daily 06/27/21 Oncology BraCammie SickleD. For symptomatic anemia. No medication changes.  06/23/21 Cardiology HacDarylene Howard, Andrea MuseNPDouglasor chronic systolic heart failure. No medication changes.  06/18/21 Pain Clinic [name]. For neck pain. No medication changes.  9/15/222 Cardiology HacDarylene Howard, Andrea MuseNPGeistownor chronic systolic heart failure. No medication changes.  05/19/21 ParIsaias CowmanD For follow-Up. Per note:carvedilol 3.125 mg twice daily 05/09/21 Orthopedic Surgeon [name], JohSmith Minced McGLattie Howard primary osteoarthritis. No information given.   04/30/21 Pain Clinic [name]. For shoulder pain. No medication changes.  04/15/21 Cardiology  [name], [name] For follow-Up. No medication changes. 04/04/21 Oncology BraCammie SickleD. For symptomatic anemia. No medication changes.  03/14/21 Oncology [name]. For new patient. No medication changes.  01/03/21 Orthopedic Surgeon  [name], [name] For primary osteoarthritis. No information given.    Hospital visits:  03/17/21 [facility] Emergency Department (3 Hours) For dyspnea on exertion. No medication changes.  01/29/21 [facility]  (24 Hours) Andrea Boroughs, MD. For symptomatic anemia. No medication changes.   Objective:  Lab Results  Component Value Date   CREATININE 0.98 06/27/2021   BUN 20 06/27/2021   GFRNONAA >60 06/27/2021   GFRAA >60 05/14/2020   NA 136 06/27/2021   K 3.6 06/27/2021   CALCIUM 8.7 (L) 06/27/2021   CO2 29 06/27/2021   GLUCOSE 106 (H) 06/27/2021    Lab Results  Component Value Date/Time   HGBA1C 5.7 (H) 07/17/2019 09:28 AM    Last diabetic Eye exam: No results found for: HMDIABEYEEXA  Last diabetic Foot exam: No results found for: HMDIABFOOTEX   Lab Results  Component Value Date   CHOL 142 11/25/2020   HDL 54 11/25/2020   LDLCALC 59 11/25/2020   TRIG 174 (H) 11/25/2020    Hepatic Function Latest Ref Rng & Units 06/27/2021 04/30/2021 03/14/2021  Total Protein 6.5 - 8.1 g/dL 6.9 6.4 6.9  Albumin 3.5 - 5.0 g/dL 3.8 4.1 3.7  AST 15 - 41 U/L 18 18 17   ALT 0 - 44 U/L 16 - 13  Alk Phosphatase 38 - 126 U/L 79 98 66  Total Bilirubin 0.3 - 1.2 mg/dL 0.5 <0.2 0.5    Lab Results  Component Value Date/Time   TSH 2.406 03/14/2021 03:32 PM   TSH 3.980 11/25/2020 11:02 AM   TSH 3.220 07/17/2019 09:28 AM   FREET4 1.20 11/25/2020 11:02 AM   FREET4 1.49 07/17/2019 09:28 AM    CBC Latest Ref Rng & Units 06/27/2021 04/04/2021 03/17/2021  WBC 4.0 - 10.5 K/uL 6.4 7.3 7.4  Hemoglobin 12.0 - 15.0 g/dL 12.0 9.9(L) 9.9(L)  Hematocrit 36.0 - 46.0 % 38.0 32.5(L) 32.5(L)  Platelets 150 - 400 K/uL 243 326 350    Lab Results  Component Value Date/Time   VD25OH 25.5 (L) 11/25/2020 11:02 AM   VD25OH 32.8 07/17/2019 09:28 AM    Clinical ASCVD: Yes The 10-year ASCVD risk score (Arnett DK, et al., 2019) Andrea: 27%   Values  used to calculate the score:     Age: 38 years     Sex: Female     [name]: No     Diabetic: No     Tobacco smoker: No     Systolic Blood Pressure: 569 mmHg     Andrea BP treated: Yes     HDL Cholesterol: 54 mg/dL     Total Cholesterol: 142 mg/dL    Depression screen [facility] 2/9 07/24/2021 07/15/2021 04/29/2021  Decreased Interest 0 0 3  Down, Depressed, Hopeless 0 0 3  PHQ - 2 Score 0 0 6  Altered sleeping - - -  Tired, decreased energy - - -  Change in appetite - - -  Feeling bad or failure about yourself  - - -  Trouble concentrating - - -  Moving slowly or fidgety/restless - - -  Suicidal thoughts - - -  PHQ-9 Score - - -  Difficult doing work/chores - - -  Some recent data might be hidden       Social History   Tobacco Use  Smoking Status Former   Types: Cigarettes  Smokeless Tobacco Never   BP Readings from Last 3 Encounters:  07/24/21 [name])  170/68  07/22/21 113/73  07/15/21 123/60   Pulse Readings from Last 3 Encounters:  07/24/21 72  07/22/21 (!) 52  07/15/21 72   Wt Readings from Last 3 Encounters:  07/24/21 186 lb (84.4 kg)  07/22/21 183 lb (83 kg)  07/15/21 183 lb 9.6 oz (83.3 kg)   BMI Readings from Last 3 Encounters:  07/24/21 38.87 kg/m  07/22/21 38.25 kg/m  07/15/21 38.37 kg/m    Assessment/Interventions: Review of patient past medical history, allergies, medications, health status, including review of consultants reports, laboratory and other test data, was performed as part of comprehensive evaluation and provision of chronic care management services.   SDOH:  (Social Determinants of Health) assessments and interventions performed: Yes  SDOH Screenings   Alcohol Screen: Low Risk    Last Alcohol Screening Score (AUDIT): 0  Depression (PHQ2-9): Low Risk    PHQ-2 Score: 0  Financial Resource Strain: Low Risk    Difficulty of Paying Living Expenses: Not very hard  Food Insecurity: Not on file  Housing: Not on file   Physical Activity: Not on file  Social Connections: Not on file  Stress: Not on file  Tobacco Use: Medium Risk   Smoking Tobacco Use: Former   Smokeless Tobacco Use: Never   Passive Exposure: Not on file  Transportation Needs: Not on file    [place]  Allergies  Allergen Reactions   Benadryl [Diphenhydramine Hcl (Sleep)] Palpitations   Cetirizine Palpitations   Lasix [Furosemide] Rash   Levaquin [Levofloxacin In D5w] Other (See Comments)    Reaction:  Fatigue and muscle soreness   Meloxicam Rash   Soy Allergy Hives and Nausea And Vomiting   Sulfa Antibiotics Rash    Medications Reviewed Today     Reviewed by [name], [facility] (Pharmacist) on 08/04/21 at [place] List Status: <None>   Medication Order Taking? Sig Documenting Provider Last Dose Status Informant  ALPRAZolam (XANAX) 0.25 MG tablet [id number] No Take 1 tablet (0.25 mg total) by mouth 2 (two) times daily as needed for anxiety. Andrea Guise, MD Taking Active   amLODipine (NORVASC) 10 MG tablet [id number] No Take 1 tablet by mouth daily. [provider] Taking Active   atorvastatin (LIPITOR) 40 MG tablet [id number] No 1 tab po qhs Andrea Osgood, NP Taking Active   bumetanide (BUMEX) 1 MG tablet [id number] No TAKE 2 TABLETS BY MOUTH TWICE DAILY Alisa Graff, FNP Taking Active   Calcium Carbonate-Vitamin D (CALCIUM 600+D PO) [id number] No Take 1 tablet by mouth daily. [provider] Taking Active Self  carvedilol (COREG) 3.125 MG tablet [id number] No Take 3.125 mg by mouth 2 (two) times daily with a meal. [provider] Taking Active            Med Note Pryor Curia Aug 04, 2021  3:42 PM) Patient taking differently per cardiology, 2 tabs twice daily  Coenzyme Q10 (COQ10) 200 MG CAPS [id number] No Take 200 mg by mouth daily. [provider] Taking Active Self  diclofenac Sodium (VOLTAREN) 1 % GEL [id number] No SMARTSIG:Gram(s) Topical Twice Daily [provider] Taking Active Self  dicyclomine (BENTYL) 10 MG capsule [id number] No Take 1 capsule (10 mg total) by mouth 3 (three) times daily as needed for spasms. Lucilla Lame, MD Taking Active   ELIQUIS 5 MG TABS tablet [id number] No Take 1 tablet by mouth twice daily.  Patient taking differently: Take 5 mg by mouth 2 (two) times daily.  Andrea Hampshire, MD Taking Active   ferrous sulfate 325 (65 FE) MG tablet [id number] No Take 2 tablets (650 mg total) by mouth 2 (two) times daily with a meal.  Patient taking differently: Take 325 mg by mouth. Takes 4 tablets by mouth twice a day   Andrea Boroughs, MD Taking Active   flecainide (TAMBOCOR) 50 MG tablet [id number] No Take 1 tablet (50 mg total) by mouth 2 (two) times daily. PLEASE CONTACT OFFICE [phone] TO SCHEDULE APPOINTMENT FOR FUTURE REFILLS Deboraha Sprang, MD Taking Active Self  levothyroxine (SYNTHROID) 25 MCG tablet [id number] No Take 1 tablet by mouth daily before breakfast. Andrea Guise, MD Taking Active   lisinopril (ZESTRIL) 40 MG tablet [id number] No Take 1 tablet (40 mg total) by mouth daily. Alisa Graff, FNP Taking Active            Med Note Carlye Grippe, Mayme Genta Jan 29, 2021  9:40 PM) Pt had today at 1100  loratadine (CLARITIN) 10 MG tablet [id number] No Take 10 mg by mouth daily. [provider] Taking Active Self  oxyCODONE-acetaminophen (PERCOCET) 7.5-325 MG tablet [id number]  Take one tab in am and half in pm for severe shoulder pain Abernathy, [name], NP  Active   pantoprazole (PROTONIX) 40 MG tablet [id number] No Take 1 tablet by mouth daily. Andrea Guise, MD Taking Active   potassium chloride (KLOR-CON) 10 MEQ tablet [id number] No Take 4 tablets (40 mEq total) by mouth 2 (two) times daily. Andrea Guise, MD Taking Active Self  sertraline (ZOLOFT) 50 MG tablet [id number] No Take 1 tablet (50 mg total) by mouth daily. Andrea Guise, MD Taking Active   traZODone (DESYREL) 50 MG tablet [id number] No Take 1 tablet  by mouth at bedtime. Andrea Guise, MD Taking Active   TRELEGY ELLIPTA 100-62.5-25 MCG/INH AEPB [id number] No INHALE 1 PUFF INTO THE LUNGS DAILY Andrea Guise, MD Taking Active   VENTOLIN HFA 108 [phone] Base) MCG/ACT inhaler [id number] No INHALE 2 PUFFS INTO THE LUNGS EVERY 6 HOURS AS NEEDED FOR WHEEZING OR SHORTNESS OF Laurey Arrow, NP Taking Active   Med List Note Janett Billow, [place] 06/23/21 [id number]): UDS 04-30-2021 Clearance to stop Eliquis sent to Dr Saralyn Pilar via Epic  inbox 06/18/21 LP faxed as well. Approved 06/23/21 LP             Patient Active Problem List   Diagnosis Date Noted   Long term prescription benzodiazepine use 04/30/2021   Chronic shoulder pain (1ry area of Pain) (Bilateral) (L>R) 04/30/2021   Chronic upper extremity pain (2ry area of Pain) (Bilateral) (L>R) 04/30/2021   Cervicalgia 04/30/2021   Chronic neck pain (3ry area of Pain) (Posterior) (Bilateral) (L>R) 04/30/2021   Shoulder blade pain (4th area of Pain) (Left) 04/30/2021   Osteoarthritis of glenohumeral joint (Left) 04/30/2021   Osteoarthritis of AC (acromioclavicular) joint (Left) 04/30/2021   Chronic headaches (5th area of Pain) 04/30/2021   Osteoarthritis of glenohumeral joints (Bilateral) 04/30/2021   Osteoarthritis of acromioclavicular joints (Bilateral) 04/30/2021   Primary osteoarthritis of shoulders (Bilateral) 04/30/2021   DDD (degenerative disc disease), cervical 04/30/2021   Cervical radiculitis (Left) 04/30/2021   Cervical radiculopathy (Left) 04/30/2021   C6 radiculopathy (Left) 04/30/2021   C7 radiculopathy (Left) 04/30/2021   Wheelchair bound 04/30/2021   Chronic pain syndrome 04/29/2021   Pharmacologic therapy 04/29/2021   Disorder of skeletal system 04/29/2021   Problems influencing health status 04/29/2021   Chronic anticoagulation (Eliquis) 03/14/2021  Hx of atrioventricular node ablation 03/14/2021   Symptomatic anemia 01/29/2021   Acute on chronic diastolic CHF  (congestive heart failure) ([place]) 05/11/2020   COPD (chronic obstructive pulmonary disease) ([name])    Hypothyroidism    Cardiac pacemaker in situ 05/10/2020   Acute non-recurrent frontal sinusitis 04/22/2020   Vertigo 04/22/2020   S/P placement of cardiac pacemaker 02/21/2020   Atrial fibrillation status post cardioversion ([place]) 11/16/2019   Episode of moderate major depression ([place]) 10/10/2019   Persistent atrial fibrillation ([place])    Encounter for general adult medical examination with abnormal findings 07/10/2019   Encounter for screening mammogram for malignant neoplasm of breast 07/10/2019   Atopic dermatitis 05/02/2019   Paroxysmal atrial flutter ([place]) 08/11/2018   Encounter for long-term (current) use of medications 07/09/2018   Chronic left shoulder pain 07/09/2018   Conjunctivitis 07/09/2018   Oxygen dependent 07/09/2018   GAD (generalized anxiety disorder) 07/09/2018   Ovarian failure 07/09/2018   Positive colorectal cancer screening using Cologuard test 04/24/2018   Calculus of gallbladder with acute on chronic cholecystitis 04/08/2018   H/O: CVA (cerebrovascular accident) 04/08/2018   Dysuria 03/02/2018   Urinary tract infection without hematuria 03/02/2018   Right upper quadrant abdominal tenderness without rebound tenderness 03/02/2018   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 exacerbation ([place]) 03/02/2018   Chronic obstructive pulmonary disease ([place]) 09/19/2017   Typical atrial flutter ([name])    Irritable bowel syndrome without diarrhea 01/29/2015   Essential hypertension 01/24/2015   Paroxysmal atrial fibrillation ([place]) 01/24/2015   History of aortic valve replacement with bioprosthetic valve 01/24/2015   Atrial fibrillation with RVR ([place]) 01/11/2015   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 05/18/2014   Shoulder impingement syndrome (Left) 05/18/2014   Cellulitis and abscess 02/13/2014   MRSA (methicillin resistant Staphylococcus aureus) 02/13/2014    Aneurysm, ascending aorta 06/23/2013   Bicuspid aortic valve 06/23/2013    Immunization History  Administered Date(s) Administered   Influenza Inj Mdck Quad Pf 06/10/2018, 06/09/2019   Influenza, High Dose Seasonal PF 06/21/2017   Influenza-Unspecified 06/21/2017, 06/12/2020, 07/01/2021   PFIZER(Purple Top)SARS-COV-2 Vaccination 10/27/2019, 11/17/2019, 07/03/2020   Pneumococcal Conjugate-13 10/07/2017    Conditions to be addressed/monitored:  Hypertension, Atrial Fibrillation, Heart Failure, Coronary Artery Disease, COPD, Hypothyroidism, Depression, Anxiety, Osteoarthritis, and IBS  Care Plan : [place]  Updates made by [name], [place] since 08/04/2021 12:00 AM     Problem: HF, AFIB, Chronic Pain, IBS, Hypothyroidism, Anemia, GAD, COPD   Priority: High     Long-Range Goal: Disease Management   Start Date: 08/04/2021  Expected End Date: 08/04/2022  This Visit's Progress: On track  Priority: High  Note:   Current Barriers:  Heavy pill burden  Pharmacist Clinical Goal(s):  Patient will contact provider office for questions/concerns as evidenced notation of same in electronic health record through collaboration with PharmD and provider.   Interventions: 1:1 collaboration with Andrea Guise, MD regarding development and update of comprehensive plan of care as evidenced by provider attestation and co-signature Inter-disciplinary care team collaboration (see longitudinal plan of care) Comprehensive medication review performed; medication list updated in electronic medical record  Hypertension (BP goal <130/80) -Controlled -Current treatment: Amlodipine 9m daily Bumetanide 16m2 tabs twice daily Carvedilol 3.12552misinopril 22m70mily -Medications previously tried: Diltiazem, Hydralazine, Lisinopril-HCTZ, Propranolol, Valsartan  -Current home readings: 197/100 yesterday morning  -Reports hypertensive symptoms of headaches in the mornings  sometimes -Educated on BP goals and benefits of medications for prevention of heart attack, stroke and kidney damage;  Daily salt intake goal < 2300 mg; Importance of home blood pressure monitoring; -Counseled to monitor BP at home once daily, document, and provide log at future appointments -Recommended to continue current medication Patient reports BP Andrea elevated despite the addition of Amlodipine 66m and increased Carvedilol dose. May need to further increase carvedilol, will discuss with PCP  Atrial Fibrillation (Goal: prevent stroke and major bleeding) -Controlled -CHADSVASC: 5 -Current treatment: Rate control: Carvedilol, Flecainide Anticoagulation: Eliquis 553mtwice daily -Medications previously tried: None noted -Home BP and HR readings: see above  -Counseled on increased risk of stroke due to Afib and benefits of anticoagulation for stroke prevention; importance of adherence to anticoagulant exactly as prescribed; seeking medical attention after a head injury or if there Andrea blood in the urine/stool; -Recommended to continue current medication  Heart Failure (Goal: manage symptoms and prevent exacerbations) -Controlled -Last ejection fraction: 60-65% (Date: 08/2017) -HF type: Diastolic -NYHA Class: II (slight limitation of activity) -AHA HF Stage: B (Heart disease present - no symptoms present) -Current treatment: Bumex 56m39m2 tabs twice daily Atorvastatin 30m56mily Carvedilol 3.125mg68minopril 30mg 39mications previously tried: None noted  -Current home BP/HR readings: see above -Current dietary habits: see above -Current exercise habits: see above -Educated on Benefits of medications for managing symptoms and prolonging life Importance of weighing daily; if you gain more than 3 pounds in one day or 5 pounds in one week,  Importance of blood pressure control -Counseled on diet and exercise extensively Recommended to continue current medication  COPD (Goal:  control symptoms and prevent exacerbations) -Controlled -Current treatment  Trelegy 100-62.5-25 1 puff dailly Ventolin 2 puffs every 6 hours as needed -Medications previously tried: None noted  -Exacerbations requiring treatment in last 6 months: None -Patient reports consistent use of maintenance inhaler -Frequency of rescue inhaler use: 1-2 Times a day, usually just once daily -Counseled on Benefits of consistent maintenance inhaler use When to use rescue inhaler -Recommended to continue current medication  Depression/Anxiety (Goal: Controlled mood) -Controlled -Current treatment: Xanax 0.25mg t356m daily as needed for anxiety Sertraline 50mg 1 3mdaily -Medications previously tried/failed: None noted -Recommended to continue current medication  IBS (Goal: Regular bowel movements) -Not ideally controlled -Current treatment  Dicyclomine 10mg thr57mimes daily as needed for spasm -Medications previously tried: none noted  -Patient reports still having stomach pains and bloating -Recommended to continue current medication therapy -Suggested switch from ferrous sulfate to ferrous gluconate to help with stomach issues  Hypothyroidism (Goal: TSH WNL) -Controlled -Current treatment  Levothyroxine 25mcg 1 d76m before breakfast -Medications previously tried: none noted  -Recommended to continue current medication  Osteoarthritis/Chronic Pain (Goal: maintain range of motion and limit pain) -Not ideally controlled -Current treatment  Diclofenac sodium 1% twice daily Percocet 7.5-325mg 1 tab in morning and 1/2 tab in evening for severe pain Acetaminophen 500mg as ne72m (1-2 times a day) -Medications previously tried:  none noted -Counseled patient on importance of not exceeding daily tylenol maximum -Counseled patient to begin taking acetaminophen 500mg at nig356mcheduled to help with pain breakthrough in middle of night. -Recommended to continue current medication  Anemia  (Goal: CBC WNL, Ferrous Sulfate WNL) -Controlled -Current treatment  Ferrous sulfate 325mg tab 2 t36mdaily -Medications previously tried: None noted  -Suggested patient could try ferrous gluconate to help with stomach symptoms   Patient Goals/Self-Care Activities Patient will:  - check blood pressure once daily, document, and provide at future appointments weigh daily, and contact provider if weight gain of 5  lbs in one week or 2-3 lbs in 1 day  Follow Up Plan: Telephone follow up appointment with care management team member scheduled for: 11/05/21       Medication Assistance: None required.  Patient affirms current coverage meets needs.  Compliance/Adherence/Medication fill history: Care Gaps: Shingles and Pneumonia Vaccine  Star-Rating Drugs: Atorvastatin 40 mg 07/21/21 30 DS Lisinopril 40 mg 06/29/21 90 DS  Patient's preferred pharmacy Andrea:  [id number] [address], [address] [place] [address] Phone: [phone] Fax: [phone]  [facility] [place], [address] [place] [address] Phone: [phone] Fax: [phone]  Uses pill box? Yes Pt endorses 100% compliance  We discussed: Benefits of medication synchronization, packaging and delivery as well as enhanced pharmacist oversight with [employer]. Patient decided to: Continue current medication management strategy. Expresses interest but wants to see what insurance coverage she will have for next year before making any changes.  Care Plan and Follow Up Patient Decision:  Patient agrees to Care Plan and Follow-up.  Plan: Telephone follow up appointment with care management team member scheduled for:  11/05/21    [name] Clinical Pharmacist [phone]

## 2021-08-04 NOTE — Patient Instructions (Addendum)
Visit Information   Goals Addressed             This Visit's Progress    Track and Manage Fluids and Swelling-Heart Failure   On track    Timeframe:  Long-Range Goal Priority:  High Start Date:    08/04/21                         Expected End Date:    08/04/22                   Follow Up Date 11/05/21    - call office if I gain more than 2 pounds in one day or 5 pounds in one week - track weight in diary - use salt in moderation - weigh myself daily    Why is this important?   It is important to check your weight daily and watch how much salt and liquids you have.  It will help you to manage your heart failure.    Notes:      Track and Manage My Blood Pressure-Hypertension   On track    Timeframe:  Long-Range Goal Priority:  High Start Date:     08/04/21                        Expected End Date: 08/04/22                      Follow Up Date 11/05/21    - check blood pressure daily - choose a place to take my blood pressure (home, clinic or office, retail store) - write blood pressure results in a log or diary    Why is this important?   You won't feel high blood pressure, but it can still hurt your blood vessels.  High blood pressure can cause heart or kidney problems. It can also cause a stroke.  Making lifestyle changes like losing a little weight or eating less salt will help.  Checking your blood pressure at home and at different times of the day can help to control blood pressure.  If the doctor prescribes medicine remember to take it the way the doctor ordered.  Call the office if you cannot afford the medicine or if there are questions about it.     Notes:        Patient Care Plan: CCM Pharmacy Care Plan     Problem Identified: HF, AFIB, Chronic Pain, IBS, Hypothyroidism, Anemia, GAD, COPD   Priority: High     Long-Range Goal: Disease Management   Start Date: 08/04/2021  Expected End Date: 08/04/2022  This Visit's Progress: On track  Priority: High   Note:   Current Barriers:  Heavy pill burden  Pharmacist Clinical Goal(s):  Patient will contact provider office for questions/concerns as evidenced notation of same in electronic health record through collaboration with PharmD and provider.   Interventions: 1:1 collaboration with [name], MD regarding development and update of comprehensive plan of care as evidenced by provider attestation and co-signature Inter-disciplinary care team collaboration (see longitudinal plan of care) Comprehensive medication review performed; medication list updated in electronic medical record  Hypertension (BP goal <130/80) -Controlled -Current treatment: Amlodipine 10mg  daily Bumetanide 1mg  2 tabs twice daily Carvedilol 3.125mg  Lisinopril 40mg  daily -Medications previously tried: Diltiazem, Hydralazine, Lisinopril-HCTZ, Propranolol, Valsartan  -Current home readings: 197/100 yesterday morning  -Reports hypertensive symptoms of headaches in the mornings sometimes -Educated  on BP goals and benefits of medications for prevention of heart attack, stroke and kidney damage; Daily salt intake goal < 2300 mg; Importance of home blood pressure monitoring; -Counseled to monitor BP at home once daily, document, and provide log at future appointments -Recommended to continue current medication Patient reports BP is elevated despite the addition of Amlodipine 10mg  and increased Carvedilol dose. May need to further increase carvedilol, will discuss with PCP  Atrial Fibrillation (Goal: prevent stroke and major bleeding) -Controlled -CHADSVASC: 5 -Current treatment: Rate control: Carvedilol, Flecainide Anticoagulation: Eliquis 5mg  twice daily -Medications previously tried: None noted -Home BP and HR readings: see above  -Counseled on increased risk of stroke due to Afib and benefits of anticoagulation for stroke prevention; importance of adherence to anticoagulant exactly as prescribed; seeking  medical attention after a head injury or if there is blood in the urine/stool; -Recommended to continue current medication  Heart Failure (Goal: manage symptoms and prevent exacerbations) -Controlled -Last ejection fraction: 60-65% (Date: 08/2017) -HF type: Diastolic -NYHA Class: II (slight limitation of activity) -AHA HF Stage: B (Heart disease present - no symptoms present) -Current treatment: Bumex 1mg : 2 tabs twice daily Atorvastatin 40mg  daily Carvedilol 3.125mg  Lisinopril 40mg  -Medications previously tried: None noted  -Current home BP/HR readings: see above -Current dietary habits: see above -Current exercise habits: see above -Educated on Benefits of medications for managing symptoms and prolonging life Importance of weighing daily; if you gain more than 3 pounds in one day or 5 pounds in one week,  Importance of blood pressure control -Counseled on diet and exercise extensively Recommended to continue current medication  COPD (Goal: control symptoms and prevent exacerbations) -Controlled -Current treatment  Trelegy 100-62.5-25 1 puff dailly Ventolin 2 puffs every 6 hours as needed -Medications previously tried: None noted  -Exacerbations requiring treatment in last 6 months: None -Patient reports consistent use of maintenance inhaler -Frequency of rescue inhaler use: 1-2 Times a day, usually just once daily -Counseled on Benefits of consistent maintenance inhaler use When to use rescue inhaler -Recommended to continue current medication  Depression/Anxiety (Goal: Controlled mood) -Controlled -Current treatment: Xanax 0.25mg  twice daily as needed for anxiety Sertraline 50mg  1 tab daily -Medications previously tried/failed: None noted -Recommended to continue current medication  IBS (Goal: Regular bowel movements) -Not ideally controlled -Current treatment  Dicyclomine 10mg  three times daily as needed for spasm -Medications previously tried: none noted   -Patient reports still having stomach pains and bloating -Recommended to continue current medication therapy -Suggested switch from ferrous sulfate to ferrous gluconate to help with stomach issues  Hypothyroidism (Goal: TSH WNL) -Controlled -Current treatment  Levothyroxine 68mcg 1 daily before breakfast -Medications previously tried: none noted  -Recommended to continue current medication  Osteoarthritis/Chronic Pain (Goal: maintain range of motion and limit pain) -Not ideally controlled -Current treatment  Diclofenac sodium 1% twice daily Percocet 7.5-325mg  1 tab in morning and 1/2 tab in evening for severe pain Acetaminophen 500mg  as needed (1-2 times a day) -Medications previously tried:  none noted -Counseled patient on importance of not exceeding daily tylenol maximum -Counseled patient to begin taking acetaminophen 500mg  at night scheduled to help with pain breakthrough in middle of night. -Recommended to continue current medication  Anemia (Goal: CBC WNL, Ferrous Sulfate WNL) -Controlled -Current treatment  Ferrous sulfate 325mg  tab 2 tabs daily -Medications previously tried: None noted  -Suggested patient could try ferrous gluconate to help with stomach symptoms   Patient Goals/Self-Care Activities Patient will:  - check blood pressure once daily,  document, and provide at future appointments weigh daily, and contact provider if weight gain of 5 lbs in one week or 2-3 lbs in 1 day  Follow Up Plan: Telephone follow up appointment with care management team member scheduled for: 11/05/21     Ms. Sweaney was given information about Chronic Care Management services today including:  CCM service includes personalized support from designated clinical staff supervised by her physician, including individualized plan of care and coordination with other care providers 24/7 contact phone numbers for assistance for urgent and routine care needs. Standard insurance, coinsurance,  copays and deductibles apply for chronic care management only during months in which we provide at least 20 minutes of these services. Most insurances cover these services at 100%, however patients may be responsible for any copay, coinsurance and/or deductible if applicable. This service may help you avoid the need for more expensive face-to-face services. Only one practitioner may furnish and bill the service in a calendar month. The patient may stop CCM services at any time (effective at the end of the month) by phone call to the office staff.  Patient agreed to services and verbal consent obtained.   The patient verbalized understanding of instructions, educational materials, and care plan provided today and agreed to receive a mailed copy of patient instructions, educational materials, and care plan.  Telephone follow up appointment with pharmacy team member scheduled for: 11/05/21  Alena Bills, [facility]  Clinical Pharmacist [phone]

## 2021-08-04 NOTE — Telephone Encounter (Signed)
Oxycodone refill sent to pharmacy

## 2021-08-05 ENCOUNTER — Telehealth: Payer: Self-pay

## 2021-08-05 NOTE — Telephone Encounter (Signed)
Spoke with pt, I tried to explain the situation and concerns regarding pt's BP. I informed pt that [name] was going to change the carvedilol dose but wanted pt to see her cardiologist. Pt already has an appt with him 08-20-2021 but she said we would need to discuss with him any changes and then disconnected the call before I could ask her any more questions or explain anything else/make sure she understood the concerns our office has with her BP. I was unable to find out if she is experiencing palpitations, or get any clarification about her BP. She has no interest in her meds/dose being changed without going through her cardiologist.

## 2021-08-05 NOTE — Telephone Encounter (Signed)
LMOM the med was sent to pharmacy

## 2021-08-06 ENCOUNTER — Ambulatory Visit: Admission: RE | Admit: 2021-08-06 | Payer: Medicare Other | Source: Ambulatory Visit

## 2021-08-06 ENCOUNTER — Telehealth: Payer: Self-pay

## 2021-08-06 DIAGNOSIS — F411 Generalized anxiety disorder: Secondary | ICD-10-CM | POA: Diagnosis not present

## 2021-08-06 DIAGNOSIS — I10 Essential (primary) hypertension: Secondary | ICD-10-CM | POA: Diagnosis not present

## 2021-08-06 NOTE — Telephone Encounter (Signed)
Disability parking placard completed by [name] and given to patient.

## 2021-08-07 ENCOUNTER — Telehealth: Payer: Self-pay | Admitting: Student-PharmD

## 2021-08-07 NOTE — Progress Notes (Signed)
  Chronic Care Management Pharmacy Assistant   Name: [name]  MRN: [id number] DOB: 10-22-1945  Reason for Encounter: CCM Care Plan  Medications: Outpatient Encounter Medications as of 08/07/2021  Medication Sig Note   ALPRAZolam (XANAX) 0.25 MG tablet Take 1 tablet (0.25 mg total) by mouth 2 (two) times daily as needed for anxiety.    amLODipine (NORVASC) 10 MG tablet Take 1 tablet by mouth daily.    atorvastatin (LIPITOR) 40 MG tablet 1 tab po qhs    bumetanide (BUMEX) 1 MG tablet TAKE 2 TABLETS BY MOUTH TWICE DAILY    Calcium Carbonate-Vitamin D (CALCIUM 600+D PO) Take 1 tablet by mouth daily.    carvedilol (COREG) 3.125 MG tablet Take 3.125 mg by mouth 2 (two) times daily with a meal. 08/04/2021: Patient taking differently per cardiology, 2 tabs twice daily   Coenzyme Q10 (COQ10) 200 MG CAPS Take 200 mg by mouth daily.    diclofenac Sodium (VOLTAREN) 1 % GEL SMARTSIG:Gram(s) Topical Twice Daily    dicyclomine (BENTYL) 10 MG capsule Take 1 capsule (10 mg total) by mouth 3 (three) times daily as needed for spasms.    ELIQUIS 5 MG TABS tablet Take 1 tablet by mouth twice daily. (Patient taking differently: Take 5 mg by mouth 2 (two) times daily.)    ferrous sulfate 325 (65 FE) MG tablet Take 2 tablets (650 mg total) by mouth 2 (two) times daily with a meal. (Patient taking differently: Take 325 mg by mouth. Takes 4 tablets by mouth twice a day)    flecainide (TAMBOCOR) 50 MG tablet Take 1 tablet (50 mg total) by mouth 2 (two) times daily. PLEASE CONTACT OFFICE [phone] TO SCHEDULE APPOINTMENT FOR FUTURE REFILLS    levothyroxine (SYNTHROID) 25 MCG tablet Take 1 tablet by mouth daily before breakfast.    lisinopril (ZESTRIL) 40 MG tablet Take 1 tablet (40 mg total) by mouth daily. 01/29/2021: Pt had today at 1100   loratadine (CLARITIN) 10 MG tablet Take 10 mg by mouth daily.    oxyCODONE-acetaminophen (PERCOCET) 7.5-325 MG tablet Take one tab in am and half in pm for severe shoulder  pain    pantoprazole (PROTONIX) 40 MG tablet Take 1 tablet by mouth daily.    potassium chloride (KLOR-CON) 10 MEQ tablet Take 4 tablets (40 mEq total) by mouth 2 (two) times daily.    sertraline (ZOLOFT) 50 MG tablet Take 1 tablet (50 mg total) by mouth daily.    traZODone (DESYREL) 50 MG tablet Take 1 tablet by mouth at bedtime.    TRELEGY ELLIPTA 100-62.5-25 MCG/INH AEPB INHALE 1 PUFF INTO THE LUNGS DAILY    VENTOLIN HFA 108 (90 Base) MCG/ACT inhaler INHALE 2 PUFFS INTO THE LUNGS EVERY 6 HOURS AS NEEDED FOR WHEEZING OR SHORTNESS OF BREATH    No facility-administered encounter medications on file as of 08/07/2021.   Reviewed the patients initial visit reinsured it was completed per the pharmacist Alena Bills request. Printed the CCM care plan. Mailed the patient CCM care plan to their most recent address on file.   Follow-Up:Pharmacist Review  Charlann Lange, [place] Pharmacist Assistant [phone]

## 2021-08-07 NOTE — Progress Notes (Signed)
error 

## 2021-08-11 ENCOUNTER — Telehealth: Payer: Self-pay | Admitting: *Deleted

## 2021-08-11 NOTE — Telephone Encounter (Signed)
Attempted to call for pre appointment review of allergies/meds. Message left. 

## 2021-08-12 ENCOUNTER — Other Ambulatory Visit: Payer: Self-pay

## 2021-08-12 ENCOUNTER — Ambulatory Visit: Payer: Medicare Other | Attending: Pain Medicine | Admitting: Pain Medicine

## 2021-08-12 ENCOUNTER — Telehealth: Payer: Self-pay

## 2021-08-12 DIAGNOSIS — G894 Chronic pain syndrome: Secondary | ICD-10-CM | POA: Diagnosis not present

## 2021-08-12 DIAGNOSIS — H9190 Unspecified hearing loss, unspecified ear: Secondary | ICD-10-CM | POA: Insufficient documentation

## 2021-08-12 DIAGNOSIS — M25511 Pain in right shoulder: Secondary | ICD-10-CM | POA: Diagnosis not present

## 2021-08-12 DIAGNOSIS — M25512 Pain in left shoulder: Secondary | ICD-10-CM | POA: Diagnosis not present

## 2021-08-12 DIAGNOSIS — G8929 Other chronic pain: Secondary | ICD-10-CM | POA: Diagnosis not present

## 2021-08-12 NOTE — Telephone Encounter (Signed)
Called patient no answer. Left message that we have tried to get in touch with her two times and messages were left to call office so we could review  her procedure.

## 2021-08-12 NOTE — Progress Notes (Signed)
Patient: [name]  Service Category: E/M  Provider: Gaspar Cola, MD  DOB: 07-Jun-1946  DOS: 08/12/2021  Location: Office  MRN: [id number]  Setting: Ambulatory outpatient  Referring Provider: [name], MD  Type: Established Patient  Specialty: Interventional Pain Management  PCP: Andrea Guise, MD  Location: Remote location  Delivery: TeleHealth     Virtual Encounter - Pain Management PROVIDER NOTE: Information contained herein reflects review and annotations entered in association with encounter. Interpretation of such information and data should be left to medically-trained personnel. Information provided to patient can be located elsewhere in the medical record under "Patient Instructions". Document created using STT-dictation technology, any transcriptional errors that may result from process are unintentional.    Contact & Pharmacy Preferred: [phone] Home: [phone] (home) Mobile: [phone] (mobile) E-mail: [email or web address] [phone] [address], [address] [place] [address] Phone: [phone] Fax: [phone]  [facility], [address] [place] [address] Phone: [phone] Fax: [phone]   Pre-screening  [name] offered "in-person" vs "virtual" encounter. She indicated preferring virtual for this encounter.   Reason COVID-19*  Social distancing based on CDC and AMA recommendations.   I contacted [name] on 08/12/2021 via telephone.      I clearly identified myself as Andrea Cola, MD. I verified that I was speaking with the correct person using two identifiers (Name: [name], and date of birth: 1946/01/06).  Consent I sought verbal advanced consent from [name] for virtual visit interactions. I informed [name] of possible security and privacy concerns,  risks, and limitations associated with providing "not-in-person" medical evaluation and management services. I also informed [name] of the availability of "in-person" appointments. Finally, I informed her that there would be a charge for the virtual visit and that she could be  personally, fully or partially, financially responsible for it. [name] expressed understanding and agreed to proceed.   Historic Elements   [name] is a 75 y.o. year old, female patient evaluated today after our last contact on 07/24/2021. [name]  has a past medical history of Aortic stenosis due to bicuspid aortic valve, Atrial fibrillation with RVR ([place]) (01/11/2015), Atrial flutter ([place]), Basal skull fracture ([name]) (20 yrs ago), CHF (congestive heart failure) ([place]), Chronic respiratory failure ([place]), COPD (chronic obstructive pulmonary disease) ([place]), Deafness in left ear, Essential hypertension (01/24/2015), History of cardiac cath, History of stress test, HLD (hyperlipidemia), HTN (hypertension), Hypothyroidism, Obesity, PAF (paroxysmal atrial fibrillation) ([place]), Paroxysmal atrial fibrillation ([place]) (01/24/2015), Right upper quadrant abdominal tenderness without rebound tenderness (03/02/2018), and S/P ascending aortic aneurysm repair (2008). She also  has a past surgical history that includes Abdominal aortic aneurysm repair (2008); Aortic valve replacement (2008); Abdominal hysterectomy; Carpal tunnel release; Tumor excision (Left); Cardiac catheterization; Cardiac catheterization (N/A, 08/17/2016); CARDIOVERSION (N/A, 08/15/2018); CARDIOVERSION (N/A, 11/14/2018); Cardioversion (N/A, 06/05/2019); Cardioversion (N/A, 08/14/2019); Cardioversion (N/A, 11/09/2019); and Cardioversion (N/A, 01/17/2020). [name] has a current medication list which includes the following prescription(s): alprazolam, amlodipine, atorvastatin, bumetanide, calcium carbonate-vitamin d, carvedilol, coq10, diclofenac sodium, dicyclomine, eliquis, ferrous  sulfate, flecainide, levothyroxine, lisinopril, loratadine, oxycodone-acetaminophen, pantoprazole, potassium chloride, sertraline, trazodone, trelegy ellipta, and ventolin hfa. She  reports that she has quit smoking. Her smoking use included cigarettes. She has never used smokeless tobacco. She reports that she does not drink  alcohol and does not use drugs. [name] is allergic to benadryl [diphenhydramine hcl (sleep)], cetirizine, lasix [furosemide], levaquin [levofloxacin in d5w], meloxicam, soy allergy, and sulfa antibiotics.   HPI  Today, she is being contacted for a post-procedure assessment.  The patient seems to be very hard of hearing and it made the appointment very difficult.  From now on, I will try to have the patient come in person with a family member so that we can further discuss the information from the procedures.  After a while, I was able to assess that while the local anesthetic was in place, she was not having any pain.  The local anesthetic then eventually wore off and she had discomfort for couple days and then the pain again went away for about 5 days before it returned.  Post-Procedure Evaluation  Procedure(s):    Procedure:          Anesthesia, Analgesia, Anxiolysis:  Type: Diagnostic Suprascapular nerve Block #1  Primary Purpose: Diagnostic Region: Posterior Shoulder & Scapular Areas Level: Superior to the scapular spine, in the lateral aspect of the supraspinatus fossa (Suprescapular notch). Target Area: Suprascapular nerve as it passes thru the lower portion of the suprascapular notch. Approach: Posterior percutaneous approach. Laterality: Bilateral  Anesthesia: Local (1-2% Lidocaine)  Anxiolysis: None  Sedation: None  Guidance: Fluoroscopy           Position: Prone   Indications: 1. Chronic shoulder pain (1ry area of Pain) (Bilateral) (L>R)   2. Osteoarthritis of acromioclavicular joints (Bilateral)   3. Osteoarthritis of glenohumeral joints (Bilateral)   4.  Primary osteoarthritis of shoulders (Bilateral)   5. Shoulder impingement syndrome (Left)   6. Shoulder blade pain (4th area of Pain) (Left)   7. Chronic anticoagulation (Eliquis)    Pain Score: Pre-procedure: 8 /10 Post-procedure: 0-No pain/10    Anxiolysis: Please see nurses note.  Effectiveness during initial hour after procedure (Ultra-Short Term Relief):   100%.  Local anesthetic used: Long-acting (4-6 hours) Effectiveness: Defined as any analgesic benefit obtained secondary to the administration of local anesthetics. This carries significant diagnostic value as to the etiological location, or anatomical origin, of the pain. Duration of benefit is expected to coincide with the duration of the local anesthetic used.  Effectiveness during initial 4-6 hours after procedure (Short-Term Relief):   100%.  Long-term benefit: Defined as any relief past the pharmacologic duration of the local anesthetics.  Effectiveness past the initial 6 hours after procedure (Long-Term Relief):   100% x 5 days.  Benefits, current: Defined as benefit present at the time of this evaluation.   Analgesia: The patient indicated that for the duration of the local anesthetic she was not having any pain.  However, after 5 days, the pain returned.  She refers that at this point the pain is just as it was before. Function: Back to baseline ROM: Back to baseline  Pharmacotherapy Assessment   Analgesic: .   Oxycodone/APAP 7.5/325, 1 tab p.o. daily (# 45) (last filled on 06/02/2021) by Erich Montane, FNP-C, working for Dr. Talmadge Chad, MD. MME/day: 7.5 mg/day   Monitoring: [place] PMP: PDMP reviewed during this encounter.       Pharmacotherapy: No side-effects or adverse reactions reported. Compliance: No problems identified. Effectiveness: Clinically acceptable. Plan: Refer to "POC". UDS:  Summary  Date Value Ref Range Status  04/30/2021 Note  Final    Comment:     ==================================================================== Compliance Drug Analysis, Ur ==================================================================== Test  Result       Flag       Units  Drug Present and Declared for Prescription Verification   Alpha-hydroxyalprazolam        107          EXPECTED   ng/mg creat    Alpha-hydroxyalprazolam is an expected metabolite of alprazolam.    Source of alprazolam is a scheduled prescription medication.    Oxycodone                      1371         EXPECTED   ng/mg creat   Oxymorphone                    600          EXPECTED   ng/mg creat   Noroxycodone                   2311         EXPECTED   ng/mg creat    Sources of oxycodone include scheduled prescription medications.    Oxymorphone and noroxycodone are expected metabolites of oxycodone.    Oxymorphone is also available as a scheduled prescription medication.    Sertraline                     PRESENT      EXPECTED   Desmethylsertraline            PRESENT      EXPECTED    Desmethylsertraline is an expected metabolite of sertraline.    Trazodone                      PRESENT      EXPECTED   1,3 chlorophenyl piperazine    PRESENT      EXPECTED    1,3-chlorophenyl piperazine is an expected metabolite of trazodone.    Acetaminophen                  PRESENT      EXPECTED  Drug Absent but Declared for Prescription Verification   Diclofenac                     Not Detected UNEXPECTED    Diclofenac, as indicated in the declared medication list, is not    always detected even when used as directed.  ==================================================================== Test                      Result    Flag   Units      Ref Range   Creatinine              45               mg/dL      >=20 ==================================================================== Declared Medications:  The flagging and interpretation on this report are based on the  following  declared medications.  Unexpected results may arise from  inaccuracies in the declared medications.   **Note: The testing scope of this panel includes these medications:   Alprazolam (Xanax)  Oxycodone (Percocet)  Sertraline (Zoloft)  Trazodone (Desyrel)   **Note: The testing scope of this panel does not include small to  moderate amounts of these reported medications:   Acetaminophen (Tylenol)  Acetaminophen (Percocet)  Diclofenac (Voltaren)   **Note: The testing scope of this panel does not include the  following reported medications:  Albuterol (Ventolin HFA)  Apixaban (Eliquis)  Atorvastatin (Lipitor)  Bumetanide (Bumex)  Calcium  Flecainide (Tambocor)  Fluticasone (Trelegy)  Iron  Levothyroxine (Synthroid)  Lisinopril (Zestril)  Loratadine (Claritin)  Pantoprazole (Protonix)  Potassium (Klor-Con)  Ubiquinone (CoQ10)  Umeclidinium (Trelegy)  Vilanterol (Trelegy)  Vitamin D ==================================================================== For clinical consultation, please call [phone]. ====================================================================      Laboratory Chemistry Profile   Renal Lab Results  Component Value Date   BUN 20 06/27/2021   CREATININE 0.98 06/27/2021   BCR 16 04/30/2021   GFRAA >60 05/14/2020   GFRNONAA >60 06/27/2021    Hepatic Lab Results  Component Value Date   AST 18 06/27/2021   ALT 16 06/27/2021   ALBUMIN 3.8 06/27/2021   ALKPHOS 79 06/27/2021   LIPASE 124 10/16/2011    Electrolytes Lab Results  Component Value Date   NA 136 06/27/2021   K 3.6 06/27/2021   CL 99 06/27/2021   CALCIUM 8.7 (L) 06/27/2021   MG 2.1 04/30/2021   PHOS 3.5 01/30/2021    Bone Lab Results  Component Value Date   VD25OH 25.5 (L) 11/25/2020   25OHVITD1 31 04/30/2021   25OHVITD2 <1.0 04/30/2021   25OHVITD3 31 04/30/2021    Inflammation (CRP: Acute Phase) (ESR: Chronic Phase) Lab Results  Component Value Date   CRP 6  04/30/2021   ESRSEDRATE 40 04/30/2021         Note: Above Lab results reviewed.  Imaging  DG PAIN CLINIC C-ARM 1-60 MIN NO REPORT Fluoro was used, but no Radiologist interpretation will be provided.  Please refer to "NOTES" tab for provider progress note.  Assessment  The primary encounter diagnosis was Chronic shoulder pain (1ry area of Pain) (Bilateral) (L>R). Diagnoses of Chronic pain syndrome and HOH (hard of hearing) were also pertinent to this visit.  Plan of Care  Problem-specific:  No problem-specific Assessment & Plan notes found for this encounter.  Ms. BILL MCVEY has a current medication list which includes the following long-term medication(s): atorvastatin, bumetanide, calcium carbonate-vitamin d, carvedilol, dicyclomine, eliquis, ferrous sulfate, flecainide, levothyroxine, lisinopril, pantoprazole, potassium chloride, sertraline, trazodone, and ventolin hfa.  Pharmacotherapy (Medications Ordered): No orders of the defined types were placed in this encounter.  Orders:  No orders of the defined types were placed in this encounter.  Follow-up plan:   Return for Eval-day (M,W), (F2F) to discuss the results of the procedure done on 07/24/2021..     Interventional Therapies  Risk  Complexity Considerations:   Estimated body mass index is 38.87 kg/m as calculated from the following:   Height as of this encounter: 4' 10" (1.473 m).   Weight as of this encounter: 186 lb (84.4 kg). 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 2 g IV prior to procedures)   Planned  Pending:   Diagnostic bilateral suprascapular NB #1 (07/24/2021)    Under consideration:   Diagnostic bilateral suprascapular NB #1 (07/24/2021)  Therapeutic bilateral suprascapular nerve RFA    Completed:   Diagnostic bilateral suprascapular NB x1 (07/24/2021)    Therapeutic  Palliative (PRN)  options:   None established    Recent Visits Date Type Provider Dept  07/24/21 Procedure visit Milinda Pointer, MD [facility]  06/18/21 Office Visit Milinda Pointer, MD [facility]  Showing recent visits within past 90 days and meeting all other requirements Today's Visits Date Type Provider Dept  08/12/21 Office Visit Milinda Pointer, MD [facility]  Showing today's visits and meeting all other requirements Future Appointments No visits were found meeting these conditions. Showing future appointments within next 90 days and meeting all other requirements I discussed the assessment and treatment plan with the patient. The patient was provided an opportunity to ask questions and all were answered. The patient agreed with the plan and demonstrated an understanding of the instructions.  Patient advised to call back or seek an in-person evaluation if the symptoms or condition worsens.  Duration of encounter: 15 minutes.  Note by: Andrea Cola, MD Date: 08/12/2021; Time: 10:58 AM

## 2021-08-13 ENCOUNTER — Encounter: Payer: Self-pay | Admitting: Nurse Practitioner

## 2021-08-14 ENCOUNTER — Other Ambulatory Visit: Payer: Self-pay

## 2021-08-14 MED ORDER — ATORVASTATIN CALCIUM 40 MG PO TABS: ORAL_TABLET | ORAL | 1 refills | Status: DC

## 2021-08-15 ENCOUNTER — Telehealth: Payer: Self-pay

## 2021-08-15 NOTE — Telephone Encounter (Signed)
Faxed office notes with cover sheet for [employer] to [phone] 08/15/21 and to [phone]

## 2021-08-21 ENCOUNTER — Ambulatory Visit: Payer: Self-pay | Admitting: Student-PharmD

## 2021-08-21 DIAGNOSIS — F411 Generalized anxiety disorder: Secondary | ICD-10-CM

## 2021-08-21 DIAGNOSIS — I10 Essential (primary) hypertension: Secondary | ICD-10-CM

## 2021-08-21 NOTE — Progress Notes (Signed)
Hypertension (HTN) Review Call   [name], [name] [id number] 86 years, Female  DOB: 1946/01/27  M: [phone]  Hypertension Review  Completed by Charlann Lange on 08/20/2021  Chart Review Is the patient enrolled in RPM with BP Monitor?: No BP #1 reading (last): 113/73 on: 07/22/2021 BP #2 reading: 123/60 on: 07/15/2021 BP #3 reading: 121/61 on: 06/26/2021 Any of the last 3 BP > 140/90 mmHg?: No What recent interventions/DTPs have been made by any provider to improve the patient's conditions in the last 3 months?: 08/12/2021 Milinda Pointer, MD. For chronic shoulder pain. No medication changes. Any recent hospitalizations or ED visits since last visit with CPP?: No  Adherence Review Adherence rates for STAR metric medications:  Atorvastatin 40 mg 30 DS 08/14/21, 07/21/21 Lisinopril 40 mg 90 DS 06/30/21, 04/08/21 Adherence rates for medications indicated for disease state being reviewed: Lisinopril 40 mg 90 DS 06/30/21, 04/08/21 Does the patient have >5 day gap between last estimated fill dates for any of the above medications?: No  Disease State Questions Able to connect with the Patient?: Yes Is the patient monitoring his/her BP?: Yes How often are you checking your BP?: daily Home BP Reading #1 (most recent): 152/75 Home BP Reading #2: 162/92 Home BP Reading #3: 119/59-177/97 Is the patient having any low BP Readings <90/60?: No Is the patient having any BP readings above >180/100?: No Is the patient's average BP>140/90?: Yes What is your blood pressure goal?: 120/80 Educate patient to inform proper points on checking BP at home:: Make sure using the right size cuff, the length of the cuff's bladder should be at least equal to 75% of the circumference of the upper arm., When taking resting blood pressure: sit quietly for 5 minutes, not within 30 min. of exercising, no talking., Do not drink caffeine or smoke a cigarette at least 30 min. prior to checking., Sit with feet flat  on the floor, arm at heart level. What diet changes have you made to improve your Blood Pressure Control?: limiting / monitoring salt intake, eating more home-cooked meals What exercise are you doing to improve your Blood Pressure Control?: no formal exercise  Pharmacist Review  Adherence gaps identified?: No Drug Therapy Problems identified?: No Assessment: Uncontrolled Plan: Scheduled another review BP call for next month, BP seems controlled at times and high others. Will monitor  10 minutes spent in review, coordination, and documentation.  Reviewed by: Alena Bills, PharmD Clinical Pharmacist [phone]

## 2021-08-28 ENCOUNTER — Ambulatory Visit: Admission: RE | Admit: 2021-08-28 | Payer: Medicare Other | Source: Ambulatory Visit

## 2021-08-30 ENCOUNTER — Other Ambulatory Visit: Payer: Self-pay | Admitting: Family

## 2021-09-02 DIAGNOSIS — Z950 Presence of cardiac pacemaker: Secondary | ICD-10-CM | POA: Diagnosis not present

## 2021-09-03 ENCOUNTER — Ambulatory Visit: Payer: Medicare Other | Admitting: Family

## 2021-09-05 ENCOUNTER — Other Ambulatory Visit: Payer: Self-pay | Admitting: Nurse Practitioner

## 2021-09-05 ENCOUNTER — Telehealth: Payer: Self-pay

## 2021-09-05 DIAGNOSIS — I10 Essential (primary) hypertension: Secondary | ICD-10-CM | POA: Diagnosis not present

## 2021-09-05 DIAGNOSIS — F411 Generalized anxiety disorder: Secondary | ICD-10-CM | POA: Diagnosis not present

## 2021-09-05 DIAGNOSIS — M25511 Pain in right shoulder: Secondary | ICD-10-CM

## 2021-09-05 MED ORDER — OXYCODONE-ACETAMINOPHEN 7.5-325 MG PO TABS: ORAL_TABLET | ORAL | 0 refills | Status: DC

## 2021-09-05 NOTE — Telephone Encounter (Signed)
Lmom we send medicine

## 2021-09-05 NOTE — Telephone Encounter (Signed)
Med sent to pharmacy.

## 2021-09-09 DIAGNOSIS — Z950 Presence of cardiac pacemaker: Secondary | ICD-10-CM | POA: Diagnosis not present

## 2021-09-09 DIAGNOSIS — I7121 Aneurysm of the ascending aorta, without rupture: Secondary | ICD-10-CM | POA: Diagnosis not present

## 2021-09-09 DIAGNOSIS — Q231 Congenital insufficiency of aortic valve: Secondary | ICD-10-CM | POA: Diagnosis not present

## 2021-09-09 DIAGNOSIS — I4891 Unspecified atrial fibrillation: Secondary | ICD-10-CM | POA: Diagnosis not present

## 2021-09-11 DIAGNOSIS — Z950 Presence of cardiac pacemaker: Secondary | ICD-10-CM | POA: Diagnosis not present

## 2021-09-15 ENCOUNTER — Ambulatory Visit: Admission: RE | Admit: 2021-09-15 | Payer: Medicare Other | Source: Ambulatory Visit

## 2021-09-15 NOTE — Progress Notes (Signed)
PROVIDER NOTE: Information contained herein reflects review and annotations entered in association with encounter. Interpretation of such information and data should be left to medically-trained personnel. Information provided to patient can be located elsewhere in the medical record under "Patient Instructions". Document created using STT-dictation technology, any transcriptional errors that may result from process are unintentional.    Patient: [name]  Service Category: E/M  Provider: Gaspar Cola, MD  DOB: 07-27-1946  DOS: 09/17/2021  Specialty: Interventional Pain Management  MRN: [id number]  Setting: Ambulatory outpatient  PCP: [name], MD  Type: Established Patient    Referring Provider: [name], MD  Location: Office  Delivery: Face-to-face     HPI  Ms. SAYDI KOBEL, a 76 y.o. year old female, is here today because of her Chronic pain of both shoulders [M25.511, G89.29, M25.512]. Ms. Shoults's primary complain today is Shoulder Pain (left) Last encounter: My last encounter with her was on 08/12/2021. Pertinent problems: Ms. 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; Shoulder impingement syndrome (Left); Chronic left shoulder pain; Chronic pain syndrome; Chronic shoulder pain (1ry area of Pain) (Bilateral) (L>R); Chronic upper extremity pain (2ry area of Pain) (Bilateral) (L>R); Cervicalgia; Chronic neck pain (3ry area of Pain) (Posterior) (Bilateral) (L>R); Shoulder blade pain (4th area of Pain) (Left); Osteoarthritis of glenohumeral joint (Left); Osteoarthritis of AC (acromioclavicular) joint (Left); Chronic headaches (5th area of Pain); Osteoarthritis of glenohumeral joints (Bilateral); Osteoarthritis of acromioclavicular joints (Bilateral); Primary osteoarthritis of shoulders (Bilateral); DDD (degenerative disc disease), cervical; Cervical radiculitis (Left); Cervical radiculopathy (Left); C6 radiculopathy (Left); and C7 radiculopathy (Left) on  their pertinent problem list. Pain Assessment: Severity of Chronic pain is reported as a 6 /10. Location: Shoulder (left)  /pain radiaties down left shouder to her elbow. Onset: More than a month ago. Quality: Discomfort, Aching, Nagging. Timing: Constant. Modifying factor(s): meds, pain cream. Vitals:  height is 4' 10"  (1.473 m) and weight is 189 lb (85.7 kg). Her temperature is 98.3 F (36.8 C). Her blood pressure is 99/65 and her pulse is 73. Her respiration is 15 and oxygen saturation is 92%.   Reason for encounter: patient-requested evaluation to discuss the results of the procedure done on 07/24/2021.  At that time, the patient had a diagnostic bilateral.  According to the 08/12/2021 follow-up evaluation, she attained 100% relief of the pain that lasted for approximately 5 days after which the pain started coming back.  The patient is hard of hearing and she came into clinic today with her daughter.  This was very helpful because I was able to communicate with her and I think that we now understand better.  Today I went over the results of the x-ray ordered by Menshew, PA-C.  This x-ray showed progressive degenerative changes of the right glenohumeral and right acromioclavicular joint.  The patient indicates that her left shoulder hurts more, but are not have any recent x-rays for that shoulder and therefore today I will be ordering some.  In any case, it is very likely that at age 47 she will be having similar findings on the other shoulder.  Today I took the time to explain to them that I cannot fix the bilateral shoulder arthropathy but I can try to manage the pain and bring it down to a lesser level.  I explained to them in detail the purpose of the diagnostic injections including the use of the local anesthetic and steroids.  I also reminded them that they need to look  at what type of weakness she gets during the time that the local anesthetic is in place so that they can determine whether or not  they can live with the results of the longer lasting changes seen with the radiofrequency ablation.  I also took the time to explain to them about that radiofrequency and we touched on medications as well.  I have explained to them that the plan is to complete 2 diagnostic injections and if she again gets good relief with the second 1, we will move onto radiofrequency ablation.  They understood and accepted.  Pharmacotherapy Assessment  Analgesic: .   Oxycodone/APAP 7.5/325, 1 tab p.o. daily (# 45) (last filled on 06/02/2021) by Erich Montane, FNP-C, working for Dr. Talmadge Chad, MD. MME/day: 7.5 mg/day   Monitoring: [place] PMP: PDMP reviewed during this encounter.       Pharmacotherapy: No side-effects or adverse reactions reported. Compliance: No problems identified. Effectiveness: Clinically acceptable.  Chauncey Fischer, RN  09/17/2021  2:34 PM  Sign when Signing Visit Safety precautions to be maintained throughout the outpatient stay will include: orient to surroundings, keep bed in low position, maintain call bell within reach at all times, provide assistance with transfer out of bed and ambulation.     UDS:  Summary  Date Value Ref Range Status  04/30/2021 Note  Final    Comment:    ==================================================================== Compliance Drug Analysis, Ur ==================================================================== Test                             Result       Flag       Units  Drug Present and Declared for Prescription Verification   Alpha-hydroxyalprazolam        107          EXPECTED   ng/mg creat    Alpha-hydroxyalprazolam is an expected metabolite of alprazolam.    Source of alprazolam is a scheduled prescription medication.    Oxycodone                      1371         EXPECTED   ng/mg creat   Oxymorphone                    600          EXPECTED   ng/mg creat   Noroxycodone                   2311         EXPECTED   ng/mg creat     Sources of oxycodone include scheduled prescription medications.    Oxymorphone and noroxycodone are expected metabolites of oxycodone.    Oxymorphone is also available as a scheduled prescription medication.    Sertraline                     PRESENT      EXPECTED   Desmethylsertraline            PRESENT      EXPECTED    Desmethylsertraline is an expected metabolite of sertraline.    Trazodone                      PRESENT      EXPECTED   1,3 chlorophenyl piperazine    PRESENT      EXPECTED  1,3-chlorophenyl piperazine is an expected metabolite of trazodone.    Acetaminophen                  PRESENT      EXPECTED  Drug Absent but Declared for Prescription Verification   Diclofenac                     Not Detected UNEXPECTED    Diclofenac, as indicated in the declared medication list, is not    always detected even when used as directed.  ==================================================================== Test                      Result    Flag   Units      Ref Range   Creatinine              45               mg/dL      >=20 ==================================================================== Declared Medications:  The flagging and interpretation on this report are based on the  following declared medications.  Unexpected results may arise from  inaccuracies in the declared medications.   **Note: The testing scope of this panel includes these medications:   Alprazolam (Xanax)  Oxycodone (Percocet)  Sertraline (Zoloft)  Trazodone (Desyrel)   **Note: The testing scope of this panel does not include small to  moderate amounts of these reported medications:   Acetaminophen (Tylenol)  Acetaminophen (Percocet)  Diclofenac (Voltaren)   **Note: The testing scope of this panel does not include the  following reported medications:   Albuterol (Ventolin HFA)  Apixaban (Eliquis)  Atorvastatin (Lipitor)  Bumetanide (Bumex)  Calcium  Flecainide (Tambocor)  Fluticasone  (Trelegy)  Iron  Levothyroxine (Synthroid)  Lisinopril (Zestril)  Loratadine (Claritin)  Pantoprazole (Protonix)  Potassium (Klor-Con)  Ubiquinone (CoQ10)  Umeclidinium (Trelegy)  Vilanterol (Trelegy)  Vitamin D ==================================================================== For clinical consultation, please call [phone]. ====================================================================      ROS  Constitutional: Denies any fever or chills Gastrointestinal: No reported hemesis, hematochezia, vomiting, or acute GI distress Musculoskeletal: Denies any acute onset joint swelling, redness, loss of ROM, or weakness Neurological: No reported episodes of acute onset apraxia, aphasia, dysarthria, agnosia, amnesia, paralysis, loss of coordination, or loss of consciousness  Medication Review  ALPRAZolam, Calcium Carbonate-Vitamin D, CoQ10, Fluticasone-Umeclidin-Vilant, albuterol, amLODipine, apixaban, atorvastatin, bumetanide, carvedilol, diclofenac Sodium, dicyclomine, ferrous sulfate, flecainide, levothyroxine, lisinopril, loratadine, oxyCODONE-acetaminophen, pantoprazole, potassium chloride, sertraline, and traZODone  History Review  Allergy: Ms. Lotz is allergic to benadryl [diphenhydramine hcl (sleep)], cetirizine, lasix [furosemide], levaquin [levofloxacin in d5w], meloxicam, soy allergy, and sulfa antibiotics. Drug: Ms. Krupinski  reports no history of drug use. Alcohol:  reports no history of alcohol use. Tobacco:  reports that she has quit smoking. Her smoking use included cigarettes. She has never used smokeless tobacco. Social: Ms. Billard  reports that she has quit smoking. Her smoking use included cigarettes. She has never used smokeless tobacco. She reports that she does not drink alcohol and does not use drugs. Medical:  has a past medical history of Aortic stenosis due to bicuspid aortic valve, Atrial fibrillation with RVR ([place]) (01/11/2015), Atrial flutter ([place]), Basal  skull fracture ([name]) (20 yrs ago), CHF (congestive heart failure) ([place]), Chronic respiratory failure ([place]), COPD (chronic obstructive pulmonary disease) ([place]), Deafness in left ear, Essential hypertension (01/24/2015), History of cardiac cath, History of stress test, HLD (hyperlipidemia), HTN (hypertension), Hypothyroidism, Obesity, PAF (paroxysmal atrial fibrillation) ([place]), Paroxysmal atrial fibrillation ([place]) (01/24/2015), Right  upper quadrant abdominal tenderness without rebound tenderness (03/02/2018), and S/P ascending aortic aneurysm repair (2008). Surgical: Ms. Winger  has a past surgical history that includes Abdominal aortic aneurysm repair (2008); Aortic valve replacement (2008); Abdominal hysterectomy; Carpal tunnel release; Tumor excision (Left); Cardiac catheterization; Cardiac catheterization (N/A, 08/17/2016); CARDIOVERSION (N/A, 08/15/2018); CARDIOVERSION (N/A, 11/14/2018); Cardioversion (N/A, 06/05/2019); Cardioversion (N/A, 08/14/2019); Cardioversion (N/A, 11/09/2019); and Cardioversion (N/A, 01/17/2020). Family: family history includes Stroke in her father and paternal grandmother.  Laboratory Chemistry Profile   Renal Lab Results  Component Value Date   BUN 20 06/27/2021   CREATININE 0.98 06/27/2021   BCR 16 04/30/2021   GFRAA >60 05/14/2020   GFRNONAA >60 06/27/2021    Hepatic Lab Results  Component Value Date   AST 18 06/27/2021   ALT 16 06/27/2021   ALBUMIN 3.8 06/27/2021   ALKPHOS 79 06/27/2021   LIPASE 124 10/16/2011    Electrolytes Lab Results  Component Value Date   NA 136 06/27/2021   K 3.6 06/27/2021   CL 99 06/27/2021   CALCIUM 8.7 (L) 06/27/2021   MG 2.1 04/30/2021   PHOS 3.5 01/30/2021    Bone Lab Results  Component Value Date   VD25OH 25.5 (L) 11/25/2020   25OHVITD1 31 04/30/2021   25OHVITD2 <1.0 04/30/2021   25OHVITD3 31 04/30/2021    Inflammation (CRP: Acute Phase) (ESR: Chronic Phase) Lab Results  Component Value Date   CRP 6 04/30/2021    ESRSEDRATE 40 04/30/2021         Note: Above Lab results reviewed.  Recent Imaging Review  DG PAIN CLINIC C-ARM 1-60 MIN NO REPORT Fluoro was used, but no Radiologist interpretation will be provided.  Please refer to "NOTES" tab for provider progress note. Note: Reviewed        Physical Exam  General appearance: Well nourished, well developed, and well hydrated. In no apparent acute distress Mental status: Alert, oriented x 3 (person, place, & time)       Respiratory: No evidence of acute respiratory distress Eyes: PERLA Vitals: BP 99/65    Pulse 73    Temp 98.3 F (36.8 C)    Resp 15    Ht 4' 10"  (1.473 m)    Wt 189 lb (85.7 kg)    SpO2 92%    BMI 39.50 kg/m  BMI: Estimated body mass index is 39.5 kg/m as calculated from the following:   Height as of this encounter: 4' 10"  (1.473 m).   Weight as of this encounter: 189 lb (85.7 kg). Ideal: Patient must be at least 60 in tall to calculate ideal body weight  Assessment   Status Diagnosis  Controlled Controlled Controlled 1. Chronic shoulder pain (1ry area of Pain) (Bilateral) (L>R)   2. Osteoarthritis of acromioclavicular joints (Bilateral)   3. Osteoarthritis of glenohumeral joints (Bilateral)   4. Chronic pain syndrome   5. Chronic anticoagulation (Eliquis)   6. Chronic left shoulder pain      Updated Problems: No problems updated.  Plan of Care  Problem-specific:  No problem-specific Assessment & Plan notes found for this encounter.  Ms. ZAMARIA BRAZZLE has a current medication list which includes the following long-term medication(s): atorvastatin, bumetanide, calcium carbonate-vitamin d, carvedilol, dicyclomine, eliquis, ferrous sulfate, flecainide, levothyroxine, lisinopril, pantoprazole, potassium chloride, sertraline, trazodone, and ventolin hfa.  Pharmacotherapy (Medications Ordered): No orders of the defined types were placed in this encounter.  Orders:  Orders Placed This Encounter  Procedures    SUPRASCAPULAR NERVE BLOCK    For shoulder  pain.    Standing Status:   Future    Standing Expiration Date:   12/15/2021    Scheduling Instructions:     Purpose: Diagnostic     Laterality: Bilateral     Level(s): Suprascapular notch     Sedation: Patient's choice.     Scheduling Timeframe: As permitted by the schedule    Order Specific Question:   Where will this procedure be performed?    Answer:   [facility]   DG Shoulder Left    Standing Status:   Future    Standing Expiration Date:   10/18/2021    Scheduling Instructions:     Imaging must be done as soon as possible. Inform patient that order will expire within 30 days and I will not renew it.    Order Specific Question:   Reason for Exam (SYMPTOM  OR DIAGNOSIS REQUIRED)    Answer:   Left shoulder pain    Order Specific Question:   Preferred imaging location?    Answer:   [facility]    Order Specific Question:   Call Results- Best Contact Number?    Answer:   [phone] ([facility])    Order Specific Question:   Release to patient    Answer:   Immediate   Blood Thinner Instructions to Nursing    Always make sure patient has clearance from prescribing physician to stop blood thinners for interventional therapies. If the patient requires a Lovenox-bridge therapy, make sure arrangements are made to institute it with the assistance of the PCP.    Scheduling Instructions:     Have Ms. Goller stop the Eliquis (Apixaban) x 3 days prior to procedure or surgery.   Follow-up plan:   Return for (Clinic) procedure: (B) SSNB #2, (Sed-anx), (Blood Thinner Protocol).     Interventional Therapies  Risk   Complexity Considerations:   Estimated body mass index is 38.87 kg/m as calculated from the following:   Height as of this encounter: 4' 10"  (1.473 m).   Weight as of this encounter: 186 lb (84.4 kg). Eliquis Anticoagulation: (Stop: 3 days   Restart: 6 hours) Severe COPD, oxygen dependent Bioprosthetic arctic valve  replacement (requires ampicillin 2 g IV prior to procedures)   Planned   Pending:   Diagnostic bilateral suprascapular NB #2    Under consideration:   Diagnostic bilateral suprascapular NB #2 (07/24/2021)  Therapeutic bilateral suprascapular nerve RFA #1    Completed:   Diagnostic bilateral suprascapular NB x1 (07/24/2021) (100/100/100 x5 days)    Therapeutic   Palliative (PRN) options:   None established    Recent Visits Date Type Provider Dept  08/12/21 Office Visit Milinda Pointer, MD [facility]  07/24/21 Procedure visit Milinda Pointer, MD [facility]  Showing recent visits within past 90 days and meeting all other requirements Today's Visits Date Type Provider Dept  09/17/21 Office Visit Milinda Pointer, MD [facility]  Showing today's visits and meeting all other requirements Future Appointments No visits were found meeting these conditions. Showing future appointments within next 90 days and meeting all other requirements  I discussed the assessment and treatment plan with the patient. The patient was provided an opportunity to ask questions and all were answered. The patient agreed with the plan and demonstrated an understanding of the instructions.  Patient advised to call back or seek an in-person evaluation if the symptoms or condition worsens.  Duration of encounter: 30 minutes.  Note by: Gaspar Cola,  MD Date: 09/17/2021; Time: 3:14 PM

## 2021-09-16 ENCOUNTER — Other Ambulatory Visit: Payer: Self-pay | Admitting: Internal Medicine

## 2021-09-16 DIAGNOSIS — J449 Chronic obstructive pulmonary disease, unspecified: Secondary | ICD-10-CM

## 2021-09-17 ENCOUNTER — Ambulatory Visit: Payer: Medicare Other | Attending: Pain Medicine | Admitting: Pain Medicine

## 2021-09-17 ENCOUNTER — Other Ambulatory Visit: Payer: Self-pay

## 2021-09-17 ENCOUNTER — Encounter: Payer: Self-pay | Admitting: Pain Medicine

## 2021-09-17 VITALS — BP 99/65 | HR 73 | Temp 98.3°F | Resp 15 | Ht <= 58 in | Wt 189.0 lb

## 2021-09-17 DIAGNOSIS — M19011 Primary osteoarthritis, right shoulder: Secondary | ICD-10-CM | POA: Insufficient documentation

## 2021-09-17 DIAGNOSIS — G8929 Other chronic pain: Secondary | ICD-10-CM | POA: Diagnosis not present

## 2021-09-17 DIAGNOSIS — M19012 Primary osteoarthritis, left shoulder: Secondary | ICD-10-CM | POA: Diagnosis not present

## 2021-09-17 DIAGNOSIS — Z7901 Long term (current) use of anticoagulants: Secondary | ICD-10-CM | POA: Insufficient documentation

## 2021-09-17 DIAGNOSIS — M25511 Pain in right shoulder: Secondary | ICD-10-CM | POA: Insufficient documentation

## 2021-09-17 DIAGNOSIS — G894 Chronic pain syndrome: Secondary | ICD-10-CM | POA: Insufficient documentation

## 2021-09-17 DIAGNOSIS — M25512 Pain in left shoulder: Secondary | ICD-10-CM | POA: Insufficient documentation

## 2021-09-17 NOTE — Progress Notes (Signed)
Safety precautions to be maintained throughout the outpatient stay will include: orient to surroundings, keep bed in low position, maintain call bell within reach at all times, provide assistance with transfer out of bed and ambulation.  

## 2021-09-17 NOTE — Patient Instructions (Addendum)
____________________________________________________________________________________________  Blood Thinners  IMPORTANT NOTICE:  If you take any of these, make sure to notify the nursing staff.  Failure to do so may result in injury.  Recommended time intervals to stop and restart blood-thinners, before & after invasive procedures  Generic Name Brand Name Pre-procedure. Stop this long before procedure. Post-procedure. Minimum waiting period before restarting.  Abciximab Reopro 15 days 2 hrs  Alteplase Activase 10 days 10 days  Anagrelide Agrylin    Apixaban Eliquis 3 days 6 hrs  Cilostazol Pletal 3 days 5 hrs  Clopidogrel Plavix 7-10 days 2 hrs  Dabigatran Pradaxa 5 days 6 hrs  Dalteparin Fragmin 24 hours 4 hrs  Dipyridamole Aggrenox 11days 2 hrs  Edoxaban Lixiana; Savaysa 3 days 2 hrs  Enoxaparin  Lovenox 24 hours 4 hrs  Eptifibatide Integrillin 8 hours 2 hrs  Fondaparinux  Arixtra 72 hours 12 hrs  Hydroxychloroquine Plaquenil 11 days   Prasugrel Effient 7-10 days 6 hrs  Reteplase Retavase 10 days 10 days  Rivaroxaban Xarelto 3 days 6 hrs  Ticagrelor Brilinta 5-7 days 6 hrs  Ticlopidine Ticlid 10-14 days 2 hrs  Tinzaparin Innohep 24 hours 4 hrs  Tirofiban Aggrastat 8 hours 2 hrs  Warfarin Coumadin 5 days 2 hrs   Other medications with blood-thinning effects  Product indications Generic (Brand) names Note  Cholesterol Lipitor Stop 4 days before procedure  Blood thinner (injectable) Heparin (LMW or LMWH Heparin) Stop 24 hours before procedure  Cancer Ibrutinib (Imbruvica) Stop 7 days before procedure  Malaria/Rheumatoid Hydroxychloroquine (Plaquenil) Stop 11 days before procedure  Thrombolytics  10 days before or after procedures   Over-the-counter (OTC) Products with blood-thinning effects  Product Common names Stop Time  Aspirin > 325 mg Goody Powders, Excedrin, etc. 11 days  Aspirin ? 81 mg  7 days  Fish oil  4 days  Garlic supplements  7 days  Ginkgo biloba  36  hours  Ginseng  24 hours  NSAIDs Ibuprofen, Naprosyn, etc. 3 days  Vitamin E  4 days   ____________________________________________________________________________________________  ______________________________________________________________________  Preparing for Procedure with Sedation  NOTICE: Due to recent regulatory changes, starting on April 07, 2021, procedures requiring intravenous (IV) sedation will no longer be performed at the [facility] [place].  These types of procedures are required to be performed at [facility] ambulatory surgery facility.  We are very sorry for the inconvenience.  Procedure appointments are limited to planned procedures: No Prescription Refills. No disability issues will be discussed. No medication changes will be discussed.  Instructions: Oral Intake: Do not eat or drink anything for at least 8 hours prior to your procedure. (Exception: Blood Pressure Medication. See below.) Transportation: A driver is required. You may not drive yourself after the procedure. Blood Pressure Medicine: Do not forget to take your blood pressure medicine with a sip of water the morning of the procedure. If your Diastolic (lower reading) is above 100 mmHg, elective cases will be cancelled/rescheduled. Blood thinners: These will need to be stopped for procedures. Notify our staff if you are taking any blood thinners. Depending on which one you take, there will be specific instructions on how and when to stop it. Diabetics on insulin: Notify the staff so that you can be scheduled 1st case in the morning. If your diabetes requires high dose insulin, take only  of your normal insulin dose the morning of the procedure and notify the staff that you have done so. Preventing infections: Shower with an antibacterial soap the morning of your  procedure. Build-up your immune system: Take 1000 mg of Vitamin C with every meal (3 times a day) the day prior to your procedure. Antibiotics: Inform  the staff if you have a condition or reason that requires you to take antibiotics before dental procedures. Pregnancy: If you are pregnant, call and cancel the procedure. Sickness: If you have a cold, fever, or any active infections, call and cancel the procedure. Arrival: You must be in the facility at least 30 minutes prior to your scheduled procedure. Children: Do not bring children with you. Dress appropriately: Bring dark clothing that you would not mind if they get stained. Valuables: Do not bring any jewelry or valuables.  Reasons to call and reschedule or cancel your procedure: (Following these recommendations will minimize the risk of a serious complication.) Surgeries: Avoid having procedures within 2 weeks of any surgery. (Avoid for 2 weeks before or after any surgery). Flu Shots: Avoid having procedures within 2 weeks of a flu shots. (Avoid for 2 weeks before or after immunizations). Barium: Avoid having a procedure within 7-10 days after having had a radiological study involving the use of radiological contrast. (Myelograms, Barium swallow or enema study). Heart attacks: Avoid any elective procedures or surgeries for the initial 6 months after a "Myocardial Infarction" (Heart Attack). Blood thinners: It is imperative that you stop these medications before procedures. Let us know if you if you take any blood thinner.  Infection: Avoid procedures during or within two weeks of an infection (including chest colds or gastrointestinal problems). Symptoms associated with infections include: Localized redness, fever, chills, night sweats or profuse sweating, burning sensation when voiding, cough, congestion, stuffiness, runny nose, sore throat, diarrhea, nausea, vomiting, cold or Flu symptoms, recent or current infections. It is specially important if the infection is over the area that we intend to treat. Heart and lung problems: Symptoms that may suggest an active cardiopulmonary problem  include: cough, chest pain, breathing difficulties or shortness of breath, dizziness, ankle swelling, uncontrolled high or unusually low blood pressure, and/or palpitations. If you are experiencing any of these symptoms, cancel your procedure and contact your primary care physician for an evaluation.  Remember:  Regular Business hours are:  Monday to Thursday 8:00 AM to 4:00 PM  Provider's Schedule: Milinda Pointer, MD:  Procedure days: Tuesday and Thursday 7:30 AM to 4:00 PM  Gillis Santa, MD:  Procedure days: Monday and Wednesday 7:30 AM to 4:00 PM ______________________________________________________________________  ____________________________________________________________________________________________  General Risks and Possible Complications  Patient Responsibilities: It is important that you read this as it is part of your informed consent. It is our duty to inform you of the risks and possible complications associated with treatments offered to you. It is your responsibility as a patient to read this and to ask questions about anything that is not clear or that you believe was not covered in this document.  Patients Rights: You have the right to refuse treatment. You also have the right to change your mind, even after initially having agreed to have the treatment done. However, under this last option, if you wait until the last second to change your mind, you may be charged for the materials used up to that point.  Introduction: Medicine is not an Chief Strategy Officer. Everything in Medicine, including the lack of treatment(s), carries the potential for danger, harm, or loss (which is by definition: Risk). In Medicine, a complication is a secondary problem, condition, or disease that can aggravate an already existing one. All treatments carry  the risk of possible complications. The fact that a side effects or complications occurs, does not imply that the treatment was conducted  incorrectly. It must be clearly understood that these can happen even when everything is done following the highest safety standards.  No treatment: You can choose not to proceed with the proposed treatment alternative. The PRO(s) would include: avoiding the risk of complications associated with the therapy. The CON(s) would include: not getting any of the treatment benefits. These benefits fall under one of three categories: diagnostic; therapeutic; and/or palliative. Diagnostic benefits include: getting information which can ultimately lead to improvement of the disease or symptom(s). Therapeutic benefits are those associated with the successful treatment of the disease. Finally, palliative benefits are those related to the decrease of the primary symptoms, without necessarily curing the condition (example: decreasing the pain from a flare-up of a chronic condition, such as incurable terminal cancer).  General Risks and Complications: These are associated to most interventional treatments. They can occur alone, or in combination. They fall under one of the following six (6) categories: no benefit or worsening of symptoms; bleeding; infection; nerve damage; allergic reactions; and/or death. No benefits or worsening of symptoms: In Medicine there are no guarantees, only probabilities. No healthcare provider can ever guarantee that a medical treatment will work, they can only state the probability that it may. Furthermore, there is always the possibility that the condition may worsen, either directly, or indirectly, as a consequence of the treatment. Bleeding: This is more common if the patient is taking a blood thinner, either prescription or over the counter (example: Goody Powders, Fish oil, Aspirin, Garlic, etc.), or if suffering a condition associated with impaired coagulation (example: Hemophilia, cirrhosis of the liver, low platelet counts, etc.). However, even if you do not have one on these, it can  still happen. If you have any of these conditions, or take one of these drugs, make sure to notify your treating physician. Infection: This is more common in patients with a compromised immune system, either due to disease (example: diabetes, cancer, human immunodeficiency virus [HIV], etc.), or due to medications or treatments (example: therapies used to treat cancer and rheumatological diseases). However, even if you do not have one on these, it can still happen. If you have any of these conditions, or take one of these drugs, make sure to notify your treating physician. Nerve Damage: This is more common when the treatment is an invasive one, but it can also happen with the use of medications, such as those used in the treatment of cancer. The damage can occur to small secondary nerves, or to large primary ones, such as those in the spinal cord and brain. This damage may be temporary or permanent and it may lead to impairments that can range from temporary numbness to permanent paralysis and/or brain death. Allergic Reactions: Any time a substance or material comes in contact with our body, there is the possibility of an allergic reaction. These can range from a mild skin rash (contact dermatitis) to a severe systemic reaction (anaphylactic reaction), which can result in death. Death: In general, any medical intervention can result in death, most of the time due to an unforeseen complication. ____________________________________________________________________________________________

## 2021-09-24 ENCOUNTER — Telehealth: Payer: Self-pay | Admitting: Pain Medicine

## 2021-09-24 NOTE — Telephone Encounter (Signed)
Called patient to clarify what kind of email she was talking about.  Patient said she deleted email but it was about some kind of change.  I am unable to find anything related to that email.  I informed patient about her procedure appointment tomorrow and I gave her pre procedure instructions.

## 2021-09-24 NOTE — Progress Notes (Signed)
PROVIDER NOTE: Interpretation of information contained herein should be left to medically-trained personnel. Specific patient instructions are provided elsewhere under "Patient Instructions" section of medical record. This document was created in part using STT-dictation technology, any transcriptional errors that may result from this process are unintentional.  Patient: [name] Type: Established DOB: 10/20/1945 MRN: [id number] PCP: [name], MD  Service: Procedure DOS: 09/25/2021 Setting: Ambulatory Location: Ambulatory outpatient facility Delivery: Face-to-face Provider: Gaspar Cola, MD Specialty: Interventional Pain Management Specialty designation: 09 Location: Outpatient facility Ref. Prov.: [name], MD    Primary Reason for Visit: Interventional Pain Management Treatment. CC: Shoulder Pain (Bilateral, left is worse)  Procedure:           Type: Suprascapular nerve block #2  Laterality:  Bilateral Level: Superior to scapular spine, lateral to supraspinatus fossa (Suprascapular notch).  Purpose: Diagnostic/Therapeutic Date: 09/25/2021 Performed by: Gaspar Cola, MD Anesthesia: Local (1-2% Lidocaine)  Anxiolysis: None  Sedation: None  Guidance: Fluoroscopy          Indications: Shoulder pain, severe enough to impact quality of life and/or function. 1. Chronic shoulder pain (1ry area of Pain) (Bilateral) (L>R)   2. Osteoarthritis of acromioclavicular joints (Bilateral)   3. Osteoarthritis of glenohumeral joints (Bilateral)   4. Primary osteoarthritis of shoulders (Bilateral)   5. Shoulder impingement syndrome (Left)   6. Shoulder blade pain (4th area of Pain) (Left)    Chronic anticoagulation (Eliquis)    NAS-11 score:   Pre-procedure: 5 /10   Post-procedure: 5 /10    Target: Suprascapular nerve Location: midway between the medial border of the scapula and the acromion as it runs through the suprascapular notch. Region: Suprascapular,  posterior shoulder  Approach: Percutaneous  Neuroanatomy: The suprascapular nerve is the lateral branch of the superior trunk of the brachial plexus. It receives nerve fibers that originate in the nerve roots C5 and C6 (and sometimes C4). It is a mixed nerve, meaning that it provides both sensory and motor supply for the suprascapular region. Function: The main function of this nerve is to provide motor innervation for two muscles, the supraspinatus and infraspinatus muscles. They are part of the rotator cuff muscles. In addition, the suprascapular nerve provides a sensory supply to the joints of the scapula (glenohumeral and acromioclavicular joints). Rationale (medical necessity): procedure needed and proper for the diagnosis and/or treatment of the patient's medical symptoms and needs.  Position / Prep / Materials:  Position: Prone Materials:  Tray: Block tray Needle(s):  Type: Spinal  Gauge (G): 22  Length: 3.5 in.  Qty: 1 Prep solution: DuraPrep (Iodine Povacrylex [0.7% available iodine] and Isopropyl Alcohol, 74% w/w) Prep Area: Entire posterior shoulder area. From upper spine to shoulder proper (upper arm), and from lateral neck to lower tip of shoulder blade.   Pre-op H&P Assessment:  [name] is a 76 y.o. (year old), female patient, seen today for interventional treatment. She  has a past surgical history that includes Abdominal aortic aneurysm repair (2008); Aortic valve replacement (2008); Abdominal hysterectomy; Carpal tunnel release; Tumor excision (Left); Cardiac catheterization; Cardiac catheterization (N/A, 08/17/2016); CARDIOVERSION (N/A, 08/15/2018); CARDIOVERSION (N/A, 11/14/2018); Cardioversion (N/A, 06/05/2019); Cardioversion (N/A, 08/14/2019); Cardioversion (N/A, 11/09/2019); and Cardioversion (N/A, 01/17/2020). [name] has a current medication list which includes the following prescription(s): alprazolam, amlodipine, atorvastatin, bumetanide, calcium carbonate-vitamin d,  carvedilol, coq10, diclofenac sodium, dicyclomine, eliquis, ferrous sulfate, flecainide, levothyroxine, lisinopril, loratadine, oxycodone-acetaminophen, pantoprazole, potassium chloride, sertraline, trazodone, trelegy ellipta, and ventolin hfa, and the following Facility-Administered Medications:  pentafluoroprop-tetrafluoroeth. Her primarily concern today is the Shoulder Pain (Bilateral, left is worse)  Initial Vital Signs:  Pulse/HCG Rate: 64ECG Heart Rate: 70 Temp: (!) 96.8 F (36 C) Resp: 18 BP: 110/68 SpO2: 92 % (O2@3lnc )  BMI: Estimated body mass index is 38.46 kg/m as calculated from the following:   Height as of this encounter: 4\' 10"  (1.473 m).   Weight as of this encounter: 184 lb (83.5 kg).  Risk Assessment: Allergies: Reviewed. She is allergic to benadryl [diphenhydramine hcl (sleep)], cetirizine, lasix [furosemide], levaquin [levofloxacin in d5w], meloxicam, soy allergy, and sulfa antibiotics.  Allergy Precautions: None required Coagulopathies: Reviewed. None identified.  Blood-thinner therapy: None at this time Active Infection(s): Reviewed. None identified. [name] is afebrile  Site Confirmation: Ms. Males was asked to confirm the procedure and laterality before marking the site Procedure checklist: Completed Consent: Before the procedure and under the influence of no sedative(s), amnesic(s), or anxiolytics, the patient was informed of the treatment options, risks and possible complications. To fulfill our ethical and legal obligations, as recommended by the American Medical Association's Code of Ethics, I have informed the patient of my clinical impression; the nature and purpose of the treatment or procedure; the risks, benefits, and possible complications of the intervention; the alternatives, including doing nothing; the risk(s) and benefit(s) of the alternative treatment(s) or procedure(s); and the risk(s) and benefit(s) of doing nothing. The patient was provided  information about the general risks and possible complications associated with the procedure. These may include, but are not limited to: failure to achieve desired goals, infection, bleeding, organ or nerve damage, allergic reactions, paralysis, and death. In addition, the patient was informed of those risks and complications associated to the procedure, such as failure to decrease pain; infection; bleeding; organ or nerve damage with subsequent damage to sensory, motor, and/or autonomic systems, resulting in permanent pain, numbness, and/or weakness of one or several areas of the body; allergic reactions; (i.e.: anaphylactic reaction); and/or death. Furthermore, the patient was informed of those risks and complications associated with the medications. These include, but are not limited to: allergic reactions (i.e.: anaphylactic or anaphylactoid reaction(s)); adrenal axis suppression; blood sugar elevation that in diabetics may result in ketoacidosis or comma; water retention that in patients with history of congestive heart failure may result in shortness of breath, pulmonary edema, and decompensation with resultant heart failure; weight gain; swelling or edema; medication-induced neural toxicity; particulate matter embolism and blood vessel occlusion with resultant organ, and/or nervous system infarction; and/or aseptic necrosis of one or more joints. Finally, the patient was informed that Medicine is not an exact science; therefore, there is also the possibility of unforeseen or unpredictable risks and/or possible complications that may result in a catastrophic outcome. The patient indicated having understood very clearly. We have given the patient no guarantees and we have made no promises. Enough time was given to the patient to ask questions, all of which were answered to the patient's satisfaction. Ms. Bennett has indicated that she wanted to continue with the procedure. Attestation: I, the ordering  provider, attest that I have discussed with the patient the benefits, risks, side-effects, alternatives, likelihood of achieving goals, and potential problems during recovery for the procedure that I have provided informed consent. Date   Time: 09/25/2021 11:15 AM  Pre-Procedure Preparation:  Monitoring: As per clinic protocol. Respiration, ETCO2, SpO2, BP, heart rate and rhythm monitor placed and checked for adequate function Safety Precautions: Patient was assessed for positional comfort and pressure points before starting  the procedure. Time-out: I initiated and conducted the "Time-out" before starting the procedure, as per protocol. The patient was asked to participate by confirming the accuracy of the "Time Out" information. Verification of the correct person, site, and procedure were performed and confirmed by me, the nursing staff, and the patient. "Time-out" conducted as per Joint Commission's Universal Protocol (UP.01.01.01). Time: 1145  Description of Procedure:          Procedural Technique Safety Precautions: Aspiration looking for blood return was conducted prior to all injections. At no point did we inject any substances, as a needle was being advanced. No attempts were made at seeking any paresthesias. Safe injection practices and needle disposal techniques used. Medications properly checked for expiration dates. SDV (single dose vial) medications used. Description of the Procedure: Protocol guidelines were followed. The patient was placed in position over the procedure table. The target area was identified and the area prepped in the usual manner. Skin & deeper tissues infiltrated with local anesthetic. Appropriate amount of time allowed to pass for local anesthetics to take effect. The procedure needles were then advanced to the target area. Proper needle placement secured. Negative aspiration confirmed. Solution injected in intermittent fashion, asking for systemic symptoms every 0.5cc of  injectate. The needles were then removed and the area cleansed, making sure to leave some of the prepping solution back to take advantage of its long term bactericidal properties.  Vitals:   09/25/21 1114 09/25/21 1146 09/25/21 1152  BP: 110/68 (!) 164/68 (!) 153/76  Pulse: 64    Resp: 18 16 18   Temp: (!) 96.8 F (36 C)    SpO2: 92% 100% 100%  Weight: 184 lb (83.5 kg)    Height: 4\' 10"  (1.473 m)      Start Time: 1145 hrs. End Time: 1151 hrs. Materials:  Needle(s) Type: Spinal Needle Gauge: 22G Length: 3.5-in Medication(s): Please see orders for medications and dosing details.  Imaging Guidance (Non-Spinal):          Type of Imaging Technique: Fluoroscopy Guidance (Non-Spinal) Indication(s): Assistance in needle guidance and placement for procedures requiring needle placement in or near specific anatomical locations not easily accessible without such assistance. Exposure Time: Please see nurses notes. Contrast: Before injecting any contrast, we confirmed that the patient did not have an allergy to iodine, shellfish, or radiological contrast. Once satisfactory needle placement was completed at the desired level, radiological contrast was injected. Contrast injected under live fluoroscopy. No contrast complications. See chart for type and volume of contrast used. Fluoroscopic Guidance: I was personally present during the use of fluoroscopy. "Tunnel Vision Technique" used to obtain the best possible view of the target area. Parallax error corrected before commencing the procedure. "Direction-depth-direction" technique used to introduce the needle under continuous pulsed fluoroscopy. Once target was reached, antero-posterior, oblique, and lateral fluoroscopic projection used confirm needle placement in all planes. Images permanently stored in EMR. Interpretation: I personally interpreted the imaging intraoperatively. Adequate needle placement confirmed in multiple planes. Appropriate spread of  contrast into desired area was observed. No evidence of afferent or efferent intravascular uptake. Permanent images saved into the patient's record.  Antibiotic Prophylaxis:   Anti-infectives (From admission, onward)    None      Indication(s): None identified  Post-operative Assessment:  Post-procedure Vital Signs:  Pulse/HCG Rate: 6470 Temp: (!) 96.8 F (36 C) Resp: 18 BP: (!) 153/76 SpO2: 100 %  EBL: None  Complications: No immediate post-treatment complications observed by team, or reported by patient.  Note:  The patient tolerated the entire procedure well. A repeat set of vitals were taken after the procedure and the patient was kept under observation following institutional policy, for this type of procedure. Post-procedural neurological assessment was performed, showing return to baseline, prior to discharge. The patient was provided with post-procedure discharge instructions, including a section on how to identify potential problems. Should any problems arise concerning this procedure, the patient was given instructions to immediately contact us, at any time, without hesitation. In any case, we plan to contact the patient by telephone for a follow-up status report regarding this interventional procedure.  Comments:   Patient had a lot of difficulty tolerating position, for any amount of time w/o moving. She will not be a good candidate for RFA.  Plan of Care  Orders:  Orders Placed This Encounter  Procedures   SUPRASCAPULAR NERVE BLOCK    For shoulder pain.    Scheduling Instructions:     Purpose: Diagnostic     Laterality: Bilateral     Level(s): Suprascapular notch     Sedation: Patient's choice.     Scheduling Timeframe: Today    Order Specific Question:   Where will this procedure be performed?    Answer:   [facility]   DG [facility] C-ARM 1-60 MIN NO REPORT    Intraoperative interpretation by procedural physician at [facility].     Standing Status:   Standing    Number of Occurrences:   1    Order Specific Question:   Reason for exam:    Answer:   Assistance in needle guidance and placement for procedures requiring needle placement in or near specific anatomical locations not easily accessible without such assistance.   Informed Consent Details: Physician/Practitioner Attestation; Transcribe to consent form and obtain patient signature    Note: Always confirm laterality of pain with Ms. Mousel, before procedure.    Order Specific Question:   Physician/Practitioner attestation of informed consent for procedure/surgical case    Answer:   I, the physician/practitioner, attest that I have discussed with the patient the benefits, risks, side effects, alternatives, likelihood of achieving goals and potential problems during recovery for the procedure that I have provided informed consent.    Order Specific Question:   Procedure    Answer:   Suprascapular Nerve Block    Order Specific Question:   Physician/Practitioner performing the procedure    Answer:   Hiram Mciver A. Dossie Arbour, MD    Order Specific Question:   Indication/Reason    Answer:   Chronic shoulder pain (arthralgia)   Care order/instruction: Please confirm that the patient has stopped the Eliquis (Apixaban) x 3 days prior to procedure or surgery.    Please confirm that the patient has stopped the Eliquis (Apixaban) x 3 days prior to procedure or surgery.    Standing Status:   Standing    Number of Occurrences:   1   Provide equipment / supplies at bedside    "Block Tray" (Disposable   single use) Needle type: SpinalSpinal Amount/quantity: 2 Size: Regular (3.5-inch) Gauge: 22G    Standing Status:   Standing    Number of Occurrences:   1    Order Specific Question:   Specify    Answer:   Block Tray   Bleeding precautions    Standing Status:   Standing    Number of Occurrences:   1   Chronic Opioid Analgesic:  .   Oxycodone/APAP 7.5/325, 1 tab p.o. daily (# 45)  (  last filled on 06/02/2021) by Erich Montane, FNP-C, working for Dr. Talmadge Chad, MD. MME/day: 7.5 mg/day   Medications ordered for procedure: Meds ordered this encounter  Medications   lidocaine (XYLOCAINE) 2 % (with pres) injection 400 mg   pentafluoroprop-tetrafluoroeth (GEBAUERS) aerosol   ropivacaine (PF) 2 mg/mL (0.2%) (NAROPIN) injection 9 mL   methylPREDNISolone acetate (DEPO-MEDROL) injection 80 mg   Medications administered: We administered lidocaine, ropivacaine (PF) 2 mg/mL (0.2%), and methylPREDNISolone acetate.  See the medical record for exact dosing, route, and time of administration.  Follow-up plan:   Return in about 2 weeks (around 10/09/2021) for Proc-day (T,Th), (F2F), (PPE).       Interventional Therapies  Risk   Complexity Considerations:   Estimated body mass index is 38.87 kg/m as calculated from the following:   Height as of this encounter: 4\' 10"  (1.473 m).   Weight as of this encounter: 186 lb (84.4 kg). 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   Planned   Pending:   Diagnostic bilateral suprascapular NB #2 (09/25/2021)   Under consideration:   Diagnostic bilateral suprascapular NB #2 (07/24/2021)  NO RFA. 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   Completed:   Diagnostic bilateral suprascapular NB x2 (09/25/2021) (100/100/100 x5 days)    Therapeutic   Palliative (PRN) options:   None established    Recent Visits Date Type Provider Dept  09/17/21 Office Visit Milinda Pointer, MD [facility]  08/12/21 Office Visit Milinda Pointer, MD [facility]  07/24/21 Procedure visit Milinda Pointer, MD [facility]  Showing recent visits within past 90 days and meeting all other  requirements Today's Visits Date Type Provider Dept  09/25/21 Procedure visit Milinda Pointer, MD [facility]  Showing today's visits and meeting all other requirements Future Appointments Date Type Provider Dept  10/09/21 Appointment Milinda Pointer, MD [facility]  Showing future appointments within next 90 days and meeting all other requirements  Disposition: Discharge home  Discharge (Date   Time): 09/25/2021; 1200 hrs.   Primary Care Physician: [name], MD Location: [facility] Note by: Gaspar Cola, MD Date: 09/25/2021; Time: 12:31 PM  Disclaimer:  Medicine is not an Chief Strategy Officer. The only guarantee in medicine is that nothing is guaranteed. It is important to note that the decision to proceed with this intervention was based on the information collected from the patient. The Data and conclusions were drawn from the patient's questionnaire, the interview, and the physical examination. Because the information was provided in large part by the patient, it cannot be guaranteed that it has not been purposely or unconsciously manipulated. Every effort has been made to obtain as much relevant data as possible for this evaluation. It is important to note that the conclusions that lead to this procedure are derived in large part from the available data. Always take into account that the treatment will also be dependent on availability of resources and existing treatment guidelines, considered by other Pain Management Practitioners as being common knowledge and practice, at the time of the intervention. For Medico-Legal purposes, it is also important to point out that variation in procedural techniques and pharmacological choices are the acceptable norm. The indications, contraindications, technique, and results of the above procedure should only  be interpreted and judged by a Board-Certified Interventional Pain Specialist with  extensive familiarity and expertise in the same exact procedure and technique.

## 2021-09-25 ENCOUNTER — Encounter: Payer: Self-pay | Admitting: Pain Medicine

## 2021-09-25 ENCOUNTER — Ambulatory Visit (HOSPITAL_BASED_OUTPATIENT_CLINIC_OR_DEPARTMENT_OTHER): Payer: Medicare Other | Admitting: Pain Medicine

## 2021-09-25 ENCOUNTER — Ambulatory Visit
Admission: RE | Admit: 2021-09-25 | Discharge: 2021-09-25 | Disposition: A | Discharge status: Home / Self Care | Payer: Medicare Other | Source: Ambulatory Visit | Attending: Pain Medicine | Admitting: Pain Medicine

## 2021-09-25 ENCOUNTER — Other Ambulatory Visit: Payer: Self-pay

## 2021-09-25 VITALS — BP 153/76 | HR 64 | Temp 96.8°F | Resp 18 | Ht <= 58 in | Wt 184.0 lb

## 2021-09-25 DIAGNOSIS — Z7901 Long term (current) use of anticoagulants: Secondary | ICD-10-CM | POA: Insufficient documentation

## 2021-09-25 DIAGNOSIS — M25512 Pain in left shoulder: Secondary | ICD-10-CM

## 2021-09-25 DIAGNOSIS — M19012 Primary osteoarthritis, left shoulder: Secondary | ICD-10-CM | POA: Diagnosis not present

## 2021-09-25 DIAGNOSIS — G8929 Other chronic pain: Secondary | ICD-10-CM | POA: Diagnosis not present

## 2021-09-25 DIAGNOSIS — M25511 Pain in right shoulder: Secondary | ICD-10-CM | POA: Diagnosis not present

## 2021-09-25 DIAGNOSIS — M7542 Impingement syndrome of left shoulder: Secondary | ICD-10-CM | POA: Diagnosis not present

## 2021-09-25 DIAGNOSIS — M898X1 Other specified disorders of bone, shoulder: Secondary | ICD-10-CM

## 2021-09-25 DIAGNOSIS — M19011 Primary osteoarthritis, right shoulder: Secondary | ICD-10-CM | POA: Diagnosis not present

## 2021-09-25 MED ORDER — METHYLPREDNISOLONE ACETATE 80 MG/ML IJ SUSP
80.0000 mg | Freq: Once | INTRAMUSCULAR | Status: AC
  Administered 2021-09-25: 80 mg
  Filled 2021-09-25: qty 1

## 2021-09-25 MED ORDER — ROPIVACAINE HCL 2 MG/ML IJ SOLN
9.0000 mL | Freq: Once | INTRAMUSCULAR | Status: AC
  Administered 2021-09-25: 9 mL via PERINEURAL

## 2021-09-25 MED ORDER — LIDOCAINE HCL (PF) 2 % IJ SOLN
INTRAMUSCULAR | Status: AC
  Filled 2021-09-25: qty 10

## 2021-09-25 MED ORDER — LIDOCAINE HCL 2 % IJ SOLN
20.0000 mL | Freq: Once | INTRAMUSCULAR | Status: AC
  Administered 2021-09-25: 100 mg

## 2021-09-25 MED ORDER — ROPIVACAINE HCL 2 MG/ML IJ SOLN
INTRAMUSCULAR | Status: AC
  Filled 2021-09-25: qty 20

## 2021-09-25 MED ORDER — PENTAFLUOROPROP-TETRAFLUOROETH EX AERO
INHALATION_SPRAY | Freq: Once | CUTANEOUS | Status: DC
  Filled 2021-09-25: qty 116

## 2021-09-25 NOTE — Patient Instructions (Addendum)
____________________________________________________________________________________________  Post-Procedure Discharge Instructions  Instructions: Apply ice:  Purpose: This will minimize any swelling and discomfort after procedure.  When: Day of procedure, as soon as you get home. How: Fill a plastic sandwich bag with crushed ice. Cover it with a small towel and apply to injection site. How long: (15 min on, 15 min off) Apply for 15 minutes then remove x 15 minutes.  Repeat sequence on day of procedure, until you go to bed. Apply heat:  Purpose: To treat any soreness and discomfort from the procedure. When: Starting the next day after the procedure. How: Apply heat to procedure site starting the day following the procedure. How long: May continue to repeat daily, until discomfort goes away. Food intake: Start with clear liquids (like water) and advance to regular food, as tolerated.  Physical activities: Keep activities to a minimum for the first 8 hours after the procedure. After that, then as tolerated. Driving: If you have received any sedation, be responsible and do not drive. You are not allowed to drive for 24 hours after having sedation. Blood thinner: (Applies only to those taking blood thinners) You may restart your blood thinner 6 hours after your procedure. Insulin: (Applies only to Diabetic patients taking insulin) As soon as you can eat, you may resume your normal dosing schedule. Infection prevention: Keep procedure site clean and dry. Shower daily and clean area with soap and water. Post-procedure Pain Diary: Extremely important that this be done correctly and accurately. Recorded information will be used to determine the next step in treatment. For the purpose of accuracy, follow these rules: Evaluate only the area treated. Do not report or include pain from an untreated area. For the purpose of this evaluation, ignore all other areas of pain, except for the treated area. After  your procedure, avoid taking a long nap and attempting to complete the pain diary after you wake up. Instead, set your alarm clock to go off every hour, on the hour, for the initial 8 hours after the procedure. Document the duration of the numbing medicine, and the relief you are getting from it. Do not go to sleep and attempt to complete it later. It will not be accurate. If you received sedation, it is likely that you were given a medication that may cause amnesia. Because of this, completing the diary at a later time may cause the information to be inaccurate. This information is needed to plan your care. Follow-up appointment: Keep your post-procedure follow-up evaluation appointment after the procedure (usually 2 weeks for most procedures, 6 weeks for radiofrequencies). DO NOT FORGET to bring you pain diary with you.   Expect: (What should I expect to see with my procedure?) From numbing medicine (AKA: Local Anesthetics): Numbness or decrease in pain. You may also experience some weakness, which if present, could last for the duration of the local anesthetic. Onset: Full effect within 15 minutes of injected. Duration: It will depend on the type of local anesthetic used. On the average, 1 to 8 hours.  From steroids (Applies only if steroids were used): Decrease in swelling or inflammation. Once inflammation is improved, relief of the pain will follow. Onset of benefits: Depends on the amount of swelling present. The more swelling, the longer it will take for the benefits to be seen. In some cases, up to 10 days. Duration: Steroids will stay in the system x 2 weeks. Duration of benefits will depend on multiple posibilities including persistent irritating factors. Side-effects: If present, they  may typically last 2 weeks (the duration of the steroids). Frequent: Cramps (if they occur, drink Gatorade and take over-the-counter Magnesium 450-500 mg once to twice a day); water retention with temporary  weight gain; increases in blood sugar; decreased immune system response; increased appetite. Occasional: Facial flushing (red, warm cheeks); mood swings; menstrual changes. Uncommon: Long-term decrease or suppression of natural hormones; bone thinning. (These are more common with higher doses or more frequent use. This is why we prefer that our patients avoid having any injection therapies in other practices.)  Very Rare: Severe mood changes; psychosis; aseptic necrosis. From procedure: Some discomfort is to be expected once the numbing medicine wears off. This should be minimal if ice and heat are applied as instructed.  Call if: (When should I call?) You experience numbness and weakness that gets worse with time, as opposed to wearing off. New onset bowel or bladder incontinence. (Applies only to procedures done in the spine)  Emergency Numbers: Durning business hours (Monday - Thursday, 8:00 AM - 4:00 PM) (Friday, 9:00 AM - 12:00 Noon): [phone] After hours: [phone] NOTE: If you are having a problem and are unable connect with, or to talk to a provider, then go to your nearest urgent care or emergency department. If the problem is serious and urgent, please call 911. ____________________________________________________________________________________________  Pain Management Discharge Instructions  General Discharge Instructions :  If you need to reach your doctor call: Monday-Friday 8:00 am - 4:00 pm at [phone] or toll free [phone].  After clinic hours [phone] to have operator reach doctor.  Bring all of your medication bottles to all your appointments in the pain clinic.  To cancel or reschedule your appointment with Pain Management please remember to call 24 hours in advance to avoid a fee.  Refer to the educational materials which you have been given on: General Risks, I had my Procedure. Discharge Instructions, Post Sedation.  Post Procedure  Instructions:  The drugs you were given will stay in your system until tomorrow, so for the next 24 hours you should not drive, make any legal decisions or drink any alcoholic beverages.  You may eat anything you prefer, but it is better to start with liquids then soups and crackers, and gradually work up to solid foods.  Please notify your doctor immediately if you have any unusual bleeding, trouble breathing or pain that is not related to your normal pain.  Depending on the type of procedure that was done, some parts of your body may feel week and/or numb.  This usually clears up by tonight or the next day.  Walk with the use of an assistive device or accompanied by an adult for the 24 hours.  You may use ice on the affected area for the first 24 hours.  Put ice in a Ziploc bag and cover with a towel and place against area 15 minutes on 15 minutes off.  You may switch to heat after 24 hours.

## 2021-09-25 NOTE — Progress Notes (Signed)
Safety precautions to be maintained throughout the outpatient stay will include: orient to surroundings, keep bed in low position, maintain call bell within reach at all times, provide assistance with transfer out of bed and ambulation.  

## 2021-09-26 ENCOUNTER — Telehealth: Payer: Self-pay

## 2021-09-29 ENCOUNTER — Other Ambulatory Visit: Payer: Self-pay

## 2021-09-29 ENCOUNTER — Other Ambulatory Visit: Payer: Self-pay | Admitting: Family

## 2021-09-29 MED ORDER — LEVOTHYROXINE SODIUM 25 MCG PO TABS: 25.0000 ug | ORAL_TABLET | Freq: Every day | ORAL | 1 refills | Status: DC

## 2021-09-30 ENCOUNTER — Ambulatory Visit: Payer: Medicare Other | Admitting: Internal Medicine

## 2021-10-01 ENCOUNTER — Other Ambulatory Visit: Payer: Self-pay

## 2021-10-01 DIAGNOSIS — F321 Major depressive disorder, single episode, moderate: Secondary | ICD-10-CM

## 2021-10-01 MED ORDER — SERTRALINE HCL 50 MG PO TABS: 50.0000 mg | ORAL_TABLET | Freq: Every day | ORAL | 1 refills | Status: DC

## 2021-10-01 MED ORDER — TRAZODONE HCL 50 MG PO TABS: 50.0000 mg | ORAL_TABLET | Freq: Every day | ORAL | 1 refills | Status: DC

## 2021-10-01 MED ORDER — PANTOPRAZOLE SODIUM 40 MG PO TBEC: 40.0000 mg | DELAYED_RELEASE_TABLET | Freq: Every day | ORAL | 1 refills | Status: DC

## 2021-10-01 MED ORDER — ATORVASTATIN CALCIUM 40 MG PO TABS: ORAL_TABLET | ORAL | 1 refills | Status: DC

## 2021-10-06 ENCOUNTER — Other Ambulatory Visit: Payer: Self-pay

## 2021-10-06 ENCOUNTER — Ambulatory Visit: Payer: Medicare Other | Attending: Family | Admitting: Family

## 2021-10-06 ENCOUNTER — Encounter: Payer: Self-pay | Admitting: Family

## 2021-10-06 VITALS — BP 105/52 | HR 72 | Resp 18 | Ht <= 58 in | Wt 185.2 lb

## 2021-10-06 DIAGNOSIS — I509 Heart failure, unspecified: Secondary | ICD-10-CM | POA: Insufficient documentation

## 2021-10-06 DIAGNOSIS — Z09 Encounter for follow-up examination after completed treatment for conditions other than malignant neoplasm: Secondary | ICD-10-CM | POA: Insufficient documentation

## 2021-10-06 DIAGNOSIS — H9192 Unspecified hearing loss, left ear: Secondary | ICD-10-CM | POA: Diagnosis not present

## 2021-10-06 DIAGNOSIS — E785 Hyperlipidemia, unspecified: Secondary | ICD-10-CM | POA: Insufficient documentation

## 2021-10-06 DIAGNOSIS — E079 Disorder of thyroid, unspecified: Secondary | ICD-10-CM | POA: Diagnosis not present

## 2021-10-06 DIAGNOSIS — I110 Hypertensive heart disease with heart failure: Secondary | ICD-10-CM | POA: Insufficient documentation

## 2021-10-06 DIAGNOSIS — Z9981 Dependence on supplemental oxygen: Secondary | ICD-10-CM | POA: Insufficient documentation

## 2021-10-06 DIAGNOSIS — R42 Dizziness and giddiness: Secondary | ICD-10-CM | POA: Diagnosis not present

## 2021-10-06 DIAGNOSIS — Z87891 Personal history of nicotine dependence: Secondary | ICD-10-CM | POA: Diagnosis not present

## 2021-10-06 DIAGNOSIS — R5383 Other fatigue: Secondary | ICD-10-CM | POA: Insufficient documentation

## 2021-10-06 DIAGNOSIS — I10 Essential (primary) hypertension: Secondary | ICD-10-CM

## 2021-10-06 DIAGNOSIS — Z950 Presence of cardiac pacemaker: Secondary | ICD-10-CM | POA: Insufficient documentation

## 2021-10-06 DIAGNOSIS — Z7901 Long term (current) use of anticoagulants: Secondary | ICD-10-CM | POA: Diagnosis not present

## 2021-10-06 DIAGNOSIS — G479 Sleep disorder, unspecified: Secondary | ICD-10-CM | POA: Insufficient documentation

## 2021-10-06 DIAGNOSIS — I480 Paroxysmal atrial fibrillation: Secondary | ICD-10-CM

## 2021-10-06 DIAGNOSIS — I5022 Chronic systolic (congestive) heart failure: Secondary | ICD-10-CM

## 2021-10-06 DIAGNOSIS — J449 Chronic obstructive pulmonary disease, unspecified: Secondary | ICD-10-CM

## 2021-10-06 DIAGNOSIS — R0602 Shortness of breath: Secondary | ICD-10-CM | POA: Diagnosis not present

## 2021-10-06 DIAGNOSIS — F321 Major depressive disorder, single episode, moderate: Secondary | ICD-10-CM

## 2021-10-06 MED ORDER — POTASSIUM CHLORIDE ER 10 MEQ PO TBCR: 40.0000 meq | EXTENDED_RELEASE_TABLET | Freq: Two times a day (BID) | ORAL | 3 refills | Status: DC

## 2021-10-06 MED ORDER — SERTRALINE HCL 50 MG PO TABS: 50.0000 mg | ORAL_TABLET | Freq: Every day | ORAL | 1 refills | Status: DC

## 2021-10-06 NOTE — Progress Notes (Signed)
Patient ID: [name], female    DOB: 26-Jul-1946, 76 y.o.   MRN: [id number]  HPI  [name] is a 76 y/o female with a history of hyperlipidemia, HTN, thyroid disease, COPD, left ear deafness, atrial fibrillation, previous tobacco use and chronic heart failure.   Echo report from 05/16/21 reviewed and showed an EF of 45% along with mild LVH. Echo report from 02/07/20 reviewed and showed an EF of >55% along with severe LAE. Catheterization done 02/07/20 but unable to view the results.   Has not been admitted or been in the ED in the last 6 months.   She presents today for a follow-up visit with a chief complaint of moderate fatigue upon minimal exertion. She describes this as chronic in nature having been present for several years. She has associated cough, shortness of breath, intermittent wheezing, difficulty sleeping, light-headedness and slight weight gain along with this. She denies any abdominal distention, palpitations, pedal edema or chest pain.   Weight gain has been gradual as she admits that she's been "snacking late at night". Wearing her oxygen currently at 2L because her portable tank will run out if it's set higher but at home, it's on 3.5L.   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 s/p bioprosthetic valve replacement 2008 at [facility];  b. 01/2015 Echo: EF 60-65%, no rwma, Gr1 DD, mildly dil LA, nl RV fxn.   Atrial fibrillation with RVR ([place]) 01/11/2015   Atrial flutter ([place])    a. 08/2016 s/p DCCV.  Remains on flecainide 50 mg bid.   Basal skull fracture ([name]) 20 yrs ago   CHF (congestive heart failure) ([name])    Chronic respiratory failure ([name])    COPD (chronic obstructive pulmonary disease) ([place])    a. 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 2008 prior to Aortic aneurysm repair-->nl cors.   History of stress test     a. 10/2015 MV: no ischemia/infarct.   HLD (hyperlipidemia)    HTN (hypertension)    Hypothyroidism    Obesity    PAF (paroxysmal atrial fibrillation) ([name])    a. on Eliquis; b. CHADS2VASc = 3 (HTN, age x 89, female).   Paroxysmal atrial fibrillation ([place]) 01/24/2015   Right upper quadrant abdominal tenderness without rebound tenderness 03/02/2018   S/P ascending aortic aneurysm repair 2008   Past Surgical History:  Procedure Laterality Date   ABDOMINAL AORTIC ANEURYSM REPAIR  2008   ABDOMINAL HYSTERECTOMY     AORTIC VALVE REPLACEMENT  2008   CARDIAC CATHETERIZATION     [facility]   CARDIOVERSION N/A 08/15/2018   Procedure: CARDIOVERSION;  Surgeon: [name], MD;  Location: [facility] ORS;  Service: Cardiovascular;  Laterality: N/A;   CARDIOVERSION N/A 11/14/2018   Procedure: CARDIOVERSION (CATH LAB);  Surgeon: [name], MD;  Location: [place] [facility];  Service: Cardiovascular;  Laterality: N/A;   CARDIOVERSION N/A 06/05/2019   Procedure: CARDIOVERSION;  Surgeon: [name], MD;  Location: [place] [facility];  Service: Cardiovascular;  Laterality: N/A;   CARDIOVERSION N/A 08/14/2019   Procedure: CARDIOVERSION;  Surgeon: [name], MD;  Location: [facility];  Service: Cardiovascular;  Laterality: N/A;   CARDIOVERSION N/A 11/09/2019   Procedure: CARDIOVERSION;  Surgeon: Isaias Cowman, MD;  Location: [facility];  Service: Cardiovascular;  Laterality: N/A;   CARDIOVERSION N/A 01/17/2020   Procedure: CARDIOVERSION;  Surgeon: Isaias Cowman, MD;  Location: [facility];  Service: Cardiovascular;  Laterality: N/A;   CARPAL TUNNEL RELEASE     ELECTROPHYSIOLOGIC STUDY N/A 08/17/2016   Procedure: Cardioversion;  Surgeon: [name], MD;  Location: [facility];  Service: Cardiovascular;  Laterality: N/A;   TUMOR EXCISION Left    x3 (arm)   Family History  Problem Relation Age of Onset   Stroke Father    Stroke Paternal Grandmother    Social History   Tobacco Use   Smoking status:  Former    Types: Cigarettes   Smokeless tobacco: Never  Substance Use Topics   Alcohol use: No   Allergies  Allergen Reactions   Benadryl [Diphenhydramine Hcl (Sleep)] Palpitations   Cetirizine Palpitations   Lasix [Furosemide] Rash   Levaquin [Levofloxacin In D5w] Other (See Comments)    Reaction:  Fatigue and muscle soreness   Meloxicam Rash   Soy Allergy Hives and Nausea And Vomiting   Sulfa Antibiotics Rash   Prior to Admission medications   Medication Sig Start Date End Date Taking? Authorizing Provider  ALPRAZolam (XANAX) 0.25 MG tablet Take 1 tablet (0.25 mg total) by mouth 2 (two) times daily as needed for anxiety. 04/21/21  Yes [name], MD  amLODipine (NORVASC) 10 MG tablet Take 1 tablet by mouth daily. 07/08/21 07/08/22 Yes [provider]  atorvastatin (LIPITOR) 40 MG tablet 1 tab po qhs 10/01/21  Yes [name], MD  bumetanide (BUMEX) 1 MG tablet TAKE 2 TABLETS BY MOUTH TWICE DAILY 08/31/21  Yes Darylene Price A, FNP  Calcium Carbonate-Vitamin D (CALCIUM 600+D PO) Take 1 tablet by mouth daily.   Yes [provider]  carvedilol (COREG) 3.125 MG tablet Take 3.125 mg by mouth 2 (two) times daily with a meal.   Yes [provider]  Coenzyme Q10 (COQ10) 200 MG CAPS Take 200 mg by mouth daily.   Yes [provider]  diclofenac Sodium (VOLTAREN) 1 % GEL SMARTSIG:Gram(s) Topical Twice Daily 09/09/20  Yes [provider]  dicyclomine (BENTYL) 10 MG capsule Take 1 capsule (10 mg total) by mouth 3 (three) times daily as needed for spasms. 07/22/21  Yes Lucilla Lame, MD  ELIQUIS 5 MG TABS tablet Take 1 tablet by mouth twice daily. Patient taking differently: Take 5 mg by mouth 2 (two) times daily. 10/26/19  Yes [name], MD  ferrous sulfate 325 (65 FE) MG tablet Take 2 tablets (650 mg total) by mouth 2 (two) times daily with a meal. Patient taking differently: Take 325 mg by mouth. Takes 4 tablets by mouth twice a day 01/30/21  Yes  Jennye Boroughs, MD  flecainide (TAMBOCOR) 50 MG tablet Take 1 tablet (50 mg total) by mouth 2 (two) times daily. PLEASE CONTACT OFFICE [phone] TO SCHEDULE APPOINTMENT FOR FUTURE REFILLS 02/23/20  Yes Deboraha Sprang, MD  levothyroxine (SYNTHROID) 25 MCG tablet Take 1 tablet (25 mcg total) by mouth daily before breakfast. 09/29/21  Yes [name], MD  lisinopril (ZESTRIL) 40 MG tablet TAKE 1 TABLET(40 MG) BY MOUTH DAILY(DOSE INCREASE) 09/29/21  Yes Darylene Price A, FNP  loratadine (CLARITIN) 10 MG tablet Take 10 mg by mouth daily.   Yes [provider]  oxyCODONE-acetaminophen (PERCOCET) 7.5-325 MG tablet Take one tab in am and half in pm for severe shoulder pain 09/05/21  Yes Abernathy, [name], NP  pantoprazole (PROTONIX) 40 MG tablet Take  1 tablet (40 mg total) by mouth daily. 10/01/21  Yes [name], MD  potassium chloride (KLOR-CON) 10 MEQ tablet Take 4 tablets (40 mEq total) by mouth 2 (two) times daily. 10/06/21  Yes Abernathy, Yetta Flock, NP  sertraline (ZOLOFT) 50 MG tablet Take 1 tablet (50 mg total) by mouth daily. 10/06/21  Yes Abernathy, Yetta Flock, NP  traZODone (DESYREL) 50 MG tablet Take 1 tablet (50 mg total) by mouth at bedtime. 10/01/21  Yes [name], MD  TRELEGY ELLIPTA 100-62.5-25 MCG/ACT AEPB Inhale 1 puff by mouth into the lungs daily. 09/16/21  Yes [name], MD  VENTOLIN HFA 108 (90 Base) MCG/ACT inhaler INHALE 2 PUFFS INTO THE LUNGS EVERY 6 HOURS AS NEEDED FOR WHEEZING OR SHORTNESS OF BREATH 02/17/21  Yes Abernathy, Yetta Flock, NP    Review of Systems  Constitutional:  Positive for appetite change (decreased) and fatigue (tire easily).  HENT:  Positive for hearing loss. Negative for congestion and sore throat.   Eyes: Negative.   Respiratory:  Positive for cough, shortness of breath and wheezing.   Cardiovascular:  Negative for chest pain, palpitations and leg swelling.  Gastrointestinal:  Negative for abdominal distention and abdominal pain.  Endocrine:  Negative.   Genitourinary: Negative.   Musculoskeletal:  Positive for arthralgias (bilateral shoulder pain). Negative for back pain.  Skin: Negative.   Allergic/Immunologic: Negative.   Neurological:  Positive for light-headedness (at times). Negative for dizziness.  Hematological:  Negative for adenopathy. Does not bruise/bleed easily.  Psychiatric/Behavioral:  Positive for dysphoric mood and sleep disturbance (trouble sleeping due to shoulder pain; wearing cpap at bedtime). The patient is not nervous/anxious.    Vitals:   10/06/21 1528  BP: (!) 105/52  Pulse: 72  Resp: 18  SpO2: 100%  Weight: 185 lb 4 oz (84 kg)  Height: 4\' 10"  (1.473 m)   Wt Readings from Last 3 Encounters:  10/06/21 185 lb 4 oz (84 kg)  09/25/21 184 lb (83.5 kg)  09/17/21 189 lb (85.7 kg)   Lab Results  Component Value Date   CREATININE 0.98 06/27/2021   CREATININE 0.80 04/30/2021   CREATININE 0.93 03/17/2021   Physical Exam Vitals and nursing note reviewed.  Constitutional:      Appearance: Normal appearance.  HENT:     Head: Normocephalic and atraumatic.     Left Ear: Decreased hearing noted.  Cardiovascular:     Rate and Rhythm: Normal rate and regular rhythm.  Pulmonary:     Effort: Pulmonary effort is normal. No respiratory distress.     Breath sounds: No wheezing or rales.  Abdominal:     General: There is no distension.     Palpations: Abdomen is soft.  Musculoskeletal:     Cervical back: Normal range of motion and neck supple.     Right lower leg: No edema.     Left lower leg: No edema.  Skin:    General: Skin is warm and dry.  Neurological:     General: No focal deficit present.     Mental Status: She is alert and oriented to person, place, and time.  Psychiatric:        Mood and Affect: Mood normal.        Behavior: Behavior normal.        Thought Content: Thought content normal.   Assessment & Plan:  1: Chronic heart failure with mildly reduced ejection fraction- - NYHA  class III - euvolemic today - weighing daily; reminded to call for  an overnight weight gain of >2 pounds or a weekly weight gain of >5 pounds - weight up 5 pounds from last visit here 3 months ago - not adding salt and is using pepper for seasoning; reviewed the importance of closely following a low sodium diet  - had telemedicine visit with cardiothoracic provider ([place]) 07/23/21 for aortic valve - on GDMT of lisinopril & carvedilol - current BP will not allow for entresto, SGLT2 or spironolactone - has had previous valve surgery  - BNP 03/17/21 was 267.5  2: HTN- - BP looks good although on the low side (105/52) - saw PCP Stephanie Coup) 04/29/21 - BMP 06/27/21 reviewed and showed sodium 136, potassium 3.6, creatinine 0.98 and GFR >60  3: severe COPD- - wearing oxygen at 3.5L around the clock at home; when she comes to appointments, she has to put her portable tank on 2L or it'll run out - saw pulmonology Humphrey Rolls) 05/13/21  4: Atrial fibrillation-  - dual chamber 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   Return in 6 months, sooner if needed.

## 2021-10-06 NOTE — Patient Instructions (Signed)
Continue weighing daily and call for an overnight weight gain of 3 pounds or more or a weekly weight gain of more than 5 pounds.  °

## 2021-10-08 ENCOUNTER — Telehealth: Payer: Self-pay

## 2021-10-08 NOTE — Telephone Encounter (Signed)
LM for patient to call office for pre virtual appointment questions.  

## 2021-10-09 ENCOUNTER — Ambulatory Visit (HOSPITAL_BASED_OUTPATIENT_CLINIC_OR_DEPARTMENT_OTHER): Payer: Medicare Other | Admitting: Pain Medicine

## 2021-10-09 DIAGNOSIS — G894 Chronic pain syndrome: Secondary | ICD-10-CM

## 2021-10-13 ENCOUNTER — Other Ambulatory Visit: Payer: Self-pay

## 2021-10-13 ENCOUNTER — Telehealth: Payer: Self-pay

## 2021-10-13 MED ORDER — AMOXICILLIN 500 MG PO CAPS: ORAL_CAPSULE | ORAL | 0 refills | Status: DC

## 2021-10-13 NOTE — Telephone Encounter (Signed)
Pt called that she going to see dentist its new pt and she used take amoxicillin 500 mg take 4 caps 1 hrs before dental procedure as per alyssa send

## 2021-10-14 ENCOUNTER — Ambulatory Visit (INDEPENDENT_AMBULATORY_CARE_PROVIDER_SITE_OTHER): Payer: Medicare Other | Admitting: Nurse Practitioner

## 2021-10-14 ENCOUNTER — Encounter: Payer: Self-pay | Admitting: Nurse Practitioner

## 2021-10-14 ENCOUNTER — Other Ambulatory Visit: Payer: Self-pay

## 2021-10-14 VITALS — BP 97/52 | HR 72 | Temp 98.2°F | Resp 16 | Ht <= 58 in | Wt 189.8 lb

## 2021-10-14 DIAGNOSIS — J449 Chronic obstructive pulmonary disease, unspecified: Secondary | ICD-10-CM

## 2021-10-14 DIAGNOSIS — I5032 Chronic diastolic (congestive) heart failure: Secondary | ICD-10-CM | POA: Diagnosis not present

## 2021-10-14 DIAGNOSIS — F411 Generalized anxiety disorder: Secondary | ICD-10-CM

## 2021-10-14 DIAGNOSIS — Z6839 Body mass index (BMI) 39.0-39.9, adult: Secondary | ICD-10-CM

## 2021-10-14 DIAGNOSIS — E662 Morbid (severe) obesity with alveolar hypoventilation: Secondary | ICD-10-CM | POA: Diagnosis not present

## 2021-10-14 DIAGNOSIS — M25511 Pain in right shoulder: Secondary | ICD-10-CM | POA: Diagnosis not present

## 2021-10-14 DIAGNOSIS — G8929 Other chronic pain: Secondary | ICD-10-CM

## 2021-10-14 MED ORDER — TRELEGY ELLIPTA 100-62.5-25 MCG/ACT IN AEPB: 1.0000 | INHALATION_SPRAY | Freq: Every day | RESPIRATORY_TRACT | 6 refills | Status: DC

## 2021-10-14 MED ORDER — ALBUTEROL SULFATE HFA 108 (90 BASE) MCG/ACT IN AERS: 2.0000 | INHALATION_SPRAY | Freq: Four times a day (QID) | RESPIRATORY_TRACT | 3 refills | Status: DC | PRN

## 2021-10-14 MED ORDER — OXYCODONE-ACETAMINOPHEN 7.5-325 MG PO TABS: ORAL_TABLET | ORAL | 0 refills | Status: DC

## 2021-10-14 NOTE — Progress Notes (Signed)
[facility] [place], [place] [address]  Internal MEDICINE  Office Visit Note  Patient Name: [name]  [id number]  [id number]  Date of Service: 10/14/2021  Chief Complaint  Patient presents with   Follow-up   Hyperlipidemia   Hypertension   COPD   Quality Metric Gaps    Pneumonia Vaccine     HPI [name] presents for a follow up visit for SOB and COPD.  [name] is in need of refills of oxycodone for chronic right shoulder pain. [name] is currently using trelegy ellipta but is still having shortness of breath. At her baseline, [name] is on continuous oxygen and usually experiencing slight SOB or intermittent SOB due to COPD and/or congestive heart failure. [name] needs refills of albuterol inhaler. [name] needs to have another PFT done.    Current Medication: Outpatient Encounter Medications as of 10/14/2021  Medication Sig Note   ALPRAZolam (XANAX) 0.25 MG tablet Take 1 tablet (0.25 mg total) by mouth 2 (two) times daily as needed for anxiety.    amLODipine (NORVASC) 10 MG tablet Take 1 tablet by mouth daily.    amoxicillin (AMOXIL) 500 MG capsule Take 4 cap po 1 hrs before dental procedure    atorvastatin (LIPITOR) 40 MG tablet 1 tab po qhs    bumetanide (BUMEX) 1 MG tablet TAKE 2 TABLETS BY MOUTH TWICE DAILY    Calcium Carbonate-Vitamin D (CALCIUM 600+D PO) Take 1 tablet by mouth daily.    carvedilol (COREG) 3.125 MG tablet Take 3.125 mg by mouth 2 (two) times daily with a meal. 08/04/2021: Patient taking differently per cardiology, 2 tabs twice daily   Coenzyme Q10 (COQ10) 200 MG CAPS Take 200 mg by mouth daily.    diclofenac Sodium (VOLTAREN) 1 % GEL SMARTSIG:Gram(s) Topical Twice Daily    dicyclomine (BENTYL) 10 MG capsule Take 1 capsule (10 mg total) by mouth 3 (three) times daily as needed for spasms.    ELIQUIS 5 MG TABS tablet Take 1 tablet by mouth twice daily. (Patient taking differently: Take 5 mg by mouth 2 (two) times daily.)    ferrous sulfate 325 (65 FE)  MG tablet Take 2 tablets (650 mg total) by mouth 2 (two) times daily with a meal. (Patient taking differently: Take 325 mg by mouth. Takes 4 tablets by mouth twice a day)    flecainide (TAMBOCOR) 50 MG tablet Take 1 tablet (50 mg total) by mouth 2 (two) times daily. PLEASE CONTACT OFFICE [phone] TO SCHEDULE APPOINTMENT FOR FUTURE REFILLS    levothyroxine (SYNTHROID) 25 MCG tablet Take 1 tablet (25 mcg total) by mouth daily before breakfast.    lisinopril (ZESTRIL) 40 MG tablet TAKE 1 TABLET(40 MG) BY MOUTH DAILY(DOSE INCREASE)    loratadine (CLARITIN) 10 MG tablet Take 10 mg by mouth daily.    pantoprazole (PROTONIX) 40 MG tablet Take 1 tablet (40 mg total) by mouth daily.    potassium chloride (KLOR-CON) 10 MEQ tablet Take 4 tablets (40 mEq total) by mouth 2 (two) times daily.    sertraline (ZOLOFT) 50 MG tablet Take 1 tablet (50 mg total) by mouth daily.    traZODone (DESYREL) 50 MG tablet Take 1 tablet (50 mg total) by mouth at bedtime.    [DISCONTINUED] oxyCODONE-acetaminophen (PERCOCET) 7.5-325 MG tablet Take one tab in am and half in pm for severe shoulder pain    [DISCONTINUED] TRELEGY ELLIPTA 100-62.5-25 MCG/ACT AEPB Inhale 1 puff by mouth into the lungs daily.    [DISCONTINUED] VENTOLIN HFA 108 (  90 Base) MCG/ACT inhaler INHALE 2 PUFFS INTO THE LUNGS EVERY 6 HOURS AS NEEDED FOR WHEEZING OR SHORTNESS OF BREATH    Fluticasone-Umeclidin-Vilant (TRELEGY ELLIPTA) 100-62.5-25 MCG/ACT AEPB Inhale 1 puff into the lungs daily.    oxyCODONE-acetaminophen (PERCOCET) 7.5-325 MG tablet Take one tab in am and half in pm for severe shoulder pain    [DISCONTINUED] albuterol (VENTOLIN HFA) 108 (90 Base) MCG/ACT inhaler Inhale 2 puffs into the lungs every 6 (six) hours as needed for wheezing or shortness of breath.    No facility-administered encounter medications on file as of 10/14/2021.    Surgical History: Past Surgical History:  Procedure Laterality Date   ABDOMINAL AORTIC ANEURYSM REPAIR  2008    ABDOMINAL HYSTERECTOMY     AORTIC VALVE REPLACEMENT  2008   CARDIAC CATHETERIZATION     [place]   CARDIOVERSION N/A 08/15/2018   Procedure: CARDIOVERSION;  Surgeon: [name], MD;  Location: [facility] ORS;  Service: Cardiovascular;  Laterality: N/A;   CARDIOVERSION N/A 11/14/2018   Procedure: CARDIOVERSION (CATH LAB);  Surgeon: [name], MD;  Location: [facility];  Service: Cardiovascular;  Laterality: N/A;   CARDIOVERSION N/A 06/05/2019   Procedure: CARDIOVERSION;  Surgeon: [name], MD;  Location: [facility];  Service: Cardiovascular;  Laterality: N/A;   CARDIOVERSION N/A 08/14/2019   Procedure: CARDIOVERSION;  Surgeon: [name], MD;  Location: [facility];  Service: Cardiovascular;  Laterality: N/A;   CARDIOVERSION N/A 11/09/2019   Procedure: CARDIOVERSION;  Surgeon: Isaias Cowman, MD;  Location: [facility];  Service: Cardiovascular;  Laterality: N/A;   CARDIOVERSION N/A 01/17/2020   Procedure: CARDIOVERSION;  Surgeon: Isaias Cowman, MD;  Location: [facility];  Service: Cardiovascular;  Laterality: N/A;   CARPAL TUNNEL RELEASE     ELECTROPHYSIOLOGIC STUDY N/A 08/17/2016   Procedure: Cardioversion;  Surgeon: [name], MD;  Location: [facility] OR[facility];  Service: Cardiovascular;  Laterality: N/A;   TUMOR EXCISION Left    x3 (arm)    Medical History: Past Medical History:  Diagnosis Date   Aortic 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 s/p bioprosthetic valve replacement 2008 at [facility];  b. 01/2015 Echo: EF 60-65%, no rwma, Gr1 DD, mildly dil LA, nl RV fxn.   Atrial fibrillation with RVR ([place]) 01/11/2015   Atrial flutter ([place])    a. 08/2016 s/p DCCV.  Remains on flecainide 50 mg bid.   Basal skull fracture ([name]) 20 yrs ago   CHF (congestive heart failure) ([name])    Chronic respiratory failure ([name])    COPD (chronic obstructive pulmonary disease) ([place])    a. 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 2008 prior to Aortic aneurysm repair-->nl cors.   History of stress test    a. 10/2015 MV: no ischemia/infarct.   HLD (hyperlipidemia)    HTN (hypertension)    Hypothyroidism    Obesity    PAF (paroxysmal atrial fibrillation) ([name])    a. on Eliquis; b. CHADS2VASc = 3 (HTN, age x 77, female).   Paroxysmal atrial fibrillation ([name]) 01/24/2015   Right upper quadrant abdominal tenderness without rebound tenderness 03/02/2018   S/P ascending aortic aneurysm repair 2008    Family History: Family History  Problem Relation Age of Onset   Stroke Father    Stroke Paternal Grandmother     Social  History   Socioeconomic History   Marital status: Single    Spouse name: Not on file   Number of children: Not on file   Years of education: Not on file   Highest education level: Not on file  Occupational History   Not on file  Tobacco Use   Smoking status: Former    Types: Cigarettes   Smokeless tobacco: Never  Vaping Use   Vaping Use: Never used  Substance and Sexual Activity   Alcohol use: No   Drug use: No   Sexual activity: Never    Birth control/protection: Surgical  Other Topics Concern   Not on file  Social History Narrative   Not on file   Social Determinants of Health   Financial Resource Strain: Low Risk    Difficulty of Paying Living Expenses: Not very hard  Food Insecurity: Not on file  Transportation Needs: Not on file  Physical Activity: Not on file  Stress: Not on file  Social Connections: Not on file  Intimate Partner Violence: Not on file      Review of Systems  Constitutional:  Positive for fatigue (related to SOB due to COPD/CHF). Negative for chills and unexpected weight change.  HENT: Negative.  Negative for congestion, rhinorrhea, sneezing and sore throat.   Eyes:  Negative for redness.  Respiratory:  Positive for cough (intermittent), chest tightness, shortness of breath (intermittent) and  wheezing (intermittent).   Cardiovascular: Negative.  Negative for chest pain and palpitations.  Gastrointestinal: Negative.  Negative for abdominal pain, constipation, diarrhea, nausea and vomiting.  Genitourinary:  Negative for dysuria and frequency.  Musculoskeletal: Negative.  Negative for arthralgias, back pain, joint swelling and neck pain.  Neurological: Negative.  Negative for tremors and numbness.  Hematological:  Negative for adenopathy. Does not bruise/bleed easily.  Psychiatric/Behavioral: Negative.  Negative for behavioral problems (Depression), sleep disturbance and suicidal ideas. The patient is not nervous/anxious.    Vital Signs: BP (!) 97/52    Pulse 72    Temp 98.2 F (36.8 C)    Resp 16    Ht 4\' 10"  (1.473 m)    Wt 189 lb 12.8 oz (86.1 kg)    SpO2 (!) 89%    BMI 39.67 kg/m    Physical Exam Vitals reviewed.  Constitutional:      General: [name] is not in acute distress.    Appearance: Normal appearance. [name] is obese. [name] is ill-appearing.  HENT:     Head: Normocephalic and atraumatic.  Eyes:     Pupils: Pupils are equal, round, and reactive to light.  Cardiovascular:     Rate and Rhythm: Normal rate and regular rhythm.  Pulmonary:     Effort: Accessory muscle usage present. No respiratory distress.     Breath sounds: Normal air entry. Examination of the right-upper field reveals wheezing. Examination of the left-upper field reveals wheezing. Wheezing present.  Neurological:     Mental Status: [name] is alert and oriented to person, place, and time.  Psychiatric:        Mood and Affect: Mood normal.        Behavior: Behavior normal.       Assessment/Plan: 1. 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 exacerbation ([place]) Continue trelegy ellipta and continue using continuous supplemental oxygen. Follow up in 3 months for routine follow up, encouraged patient to follow up earlier if breathing worsens  - Fluticasone-Umeclidin-Vilant (TRELEGY ELLIPTA) 100-62.5-25 MCG/ACT  AEPB; Inhale 1 puff into the lungs daily.  Dispense: 60 each; Refill: 6  2. Chronic diastolic heart failure [facility]) Patient is followed by cardiology. Has activity intolerance, easily fatigued and has SOB with minimal exertion which can be related to COPD and/or CHF. Continue to monitor closely follow up in 3 months for routine follow up, patient encouraged to call for earlier follow up as needed if her breathing worsens or if [name] has any significant weight gain in a short period of time.   3. Chronic right shoulder pain Chronic pain, refill ordered.  - oxyCODONE-acetaminophen (PERCOCET) 7.5-325 MG tablet; Take one tab in am and half in pm for severe shoulder pain  Dispense: 45 tablet; Refill: 0  4. Class 2 obesity with alveolar hypoventilation, serious comorbidity, and body mass index (BMI) of 39.0 to 39.9 in adult ([name]) Significantly high BMI, patient is easily fatigued and has activity intolerance due to COPD and CHF. This makes it difficult to lose weight and patient's risk is increased due to risk factors associated with obesity.   5. GAD (generalized anxiety disorder) Due to baseline shortness of breath, fatigue and activity intolerance, patient has a significant amount of anxiety which [name] takes alprazolam as needed for.    General Counseling: [name] verbalizes understanding of the findings of todays visit and agrees with plan of treatment. I have discussed any further diagnostic evaluation that may be needed or ordered today. We also reviewed her medications today. [name] has been encouraged to call the office with any questions or concerns that should arise related to todays visit.    No orders of the defined types were placed in this encounter.   Meds ordered this encounter  Medications   oxyCODONE-acetaminophen (PERCOCET) 7.5-325 MG tablet    Sig: Take one tab in am and half in pm for severe shoulder pain    Dispense:  45 tablet    Refill:  0   Fluticasone-Umeclidin-Vilant (TRELEGY  ELLIPTA) 100-62.5-25 MCG/ACT AEPB    Sig: Inhale 1 puff into the lungs daily.    Dispense:  60 each    Refill:  6   DISCONTD: albuterol (VENTOLIN HFA) 108 (90 Base) MCG/ACT inhaler    Sig: Inhale 2 puffs into the lungs every 6 (six) hours as needed for wheezing or shortness of breath.    Dispense:  18 g    Refill:  3    Return in about 3 months (around 01/11/2022) for F/U, med refill, Kristof Nadeem PCP and as needed if shortness of breath worsens.   Total time spent:30 Minutes Time spent includes review of chart, medications, test results, and follow up plan with the patient.   [place] Controlled Substance Database was reviewed by me.  This patient was seen by Jonetta Osgood, FNP-C in collaboration with Dr. Clayborn Bigness as a part of collaborative care agreement.   Kaija Kovacevic R. Valetta Fuller, MSN, FNP-C Internal medicine

## 2021-10-15 ENCOUNTER — Telehealth: Payer: Self-pay | Admitting: *Deleted

## 2021-10-15 ENCOUNTER — Other Ambulatory Visit: Payer: Self-pay

## 2021-10-15 ENCOUNTER — Telehealth: Payer: Self-pay | Admitting: Pain Medicine

## 2021-10-15 DIAGNOSIS — R0602 Shortness of breath: Secondary | ICD-10-CM

## 2021-10-15 NOTE — Progress Notes (Deleted)
Patient called and canceled indicating that she was sick.

## 2021-10-15 NOTE — Telephone Encounter (Signed)
Attempted to call for pre appointment review of allergies/meds. Message left. 

## 2021-10-15 NOTE — Telephone Encounter (Signed)
Patient called. 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 Appointment confirmed.

## 2021-10-16 ENCOUNTER — Other Ambulatory Visit: Payer: Self-pay

## 2021-10-16 ENCOUNTER — Ambulatory Visit (HOSPITAL_BASED_OUTPATIENT_CLINIC_OR_DEPARTMENT_OTHER): Payer: Medicare Other | Admitting: Pain Medicine

## 2021-10-16 DIAGNOSIS — Z6839 Body mass index (BMI) 39.0-39.9, adult: Secondary | ICD-10-CM | POA: Insufficient documentation

## 2021-10-16 DIAGNOSIS — Z6841 Body Mass Index (BMI) 40.0 and over, adult: Secondary | ICD-10-CM | POA: Insufficient documentation

## 2021-10-16 DIAGNOSIS — R0602 Shortness of breath: Secondary | ICD-10-CM

## 2021-10-16 DIAGNOSIS — R6889 Other general symptoms and signs: Secondary | ICD-10-CM | POA: Insufficient documentation

## 2021-10-16 DIAGNOSIS — Z9189 Other specified personal risk factors, not elsewhere classified: Secondary | ICD-10-CM | POA: Insufficient documentation

## 2021-10-16 DIAGNOSIS — E662 Morbid (severe) obesity with alveolar hypoventilation: Secondary | ICD-10-CM | POA: Insufficient documentation

## 2021-10-16 DIAGNOSIS — G894 Chronic pain syndrome: Secondary | ICD-10-CM

## 2021-10-16 DIAGNOSIS — R0601 Orthopnea: Secondary | ICD-10-CM | POA: Insufficient documentation

## 2021-10-21 ENCOUNTER — Telehealth: Payer: Self-pay

## 2021-10-21 NOTE — Telephone Encounter (Signed)
Pt called that she is having sob when she was go to bathroom,no chest pain,no leg swelling and no cold symptoms and her pulse about 52-53 as per Dr Clayborn Bigness  advised her to increased a oxygen to 4 litre when she was walking and also gave her appt to Thursday and advised her if symptoms getting worse go to Ed or call us

## 2021-10-23 ENCOUNTER — Emergency Department
Admission: EM | Admit: 2021-10-23 | Discharge: 2021-10-23 | Disposition: A | Discharge status: Home / Self Care | Payer: Medicare Other | Attending: Student in an Organized Health Care Education/Training Program | Admitting: Student in an Organized Health Care Education/Training Program

## 2021-10-23 ENCOUNTER — Ambulatory Visit: Payer: Medicare Other | Admitting: Nurse Practitioner

## 2021-10-23 ENCOUNTER — Emergency Department: Payer: Medicare Other

## 2021-10-23 ENCOUNTER — Other Ambulatory Visit: Payer: Self-pay

## 2021-10-23 ENCOUNTER — Encounter: Payer: Self-pay | Admitting: Nurse Practitioner

## 2021-10-23 VITALS — BP 102/60 | HR 70 | Temp 98.8°F | Resp 16 | Ht <= 58 in | Wt 191.0 lb

## 2021-10-23 DIAGNOSIS — I5032 Chronic diastolic (congestive) heart failure: Secondary | ICD-10-CM | POA: Diagnosis not present

## 2021-10-23 DIAGNOSIS — Z5321 Procedure and treatment not carried out due to patient leaving prior to being seen by health care provider: Secondary | ICD-10-CM | POA: Diagnosis not present

## 2021-10-23 DIAGNOSIS — F411 Generalized anxiety disorder: Secondary | ICD-10-CM

## 2021-10-23 DIAGNOSIS — J449 Chronic obstructive pulmonary disease, unspecified: Secondary | ICD-10-CM

## 2021-10-23 DIAGNOSIS — R0602 Shortness of breath: Secondary | ICD-10-CM | POA: Diagnosis not present

## 2021-10-23 DIAGNOSIS — I700 Atherosclerosis of aorta: Secondary | ICD-10-CM | POA: Diagnosis not present

## 2021-10-23 LAB — BASIC METABOLIC PANEL
Anion gap: 7 (ref 5–15)
BUN: 17 mg/dL (ref 8–23)
CO2: 28 mmol/L (ref 22–32)
Calcium: 9 mg/dL (ref 8.9–10.3)
Chloride: 102 mmol/L (ref 98–111)
Creatinine, Ser: 0.82 mg/dL (ref 0.44–1.00)
GFR, Estimated: >60 mL/min (ref >60)
Glucose, Bld: 109 mg/dL — ABNORMAL HIGH (ref 70–99)
Potassium: 3.9 mmol/L (ref 3.5–5.1)
Sodium: 137 mmol/L (ref 135–145)

## 2021-10-23 LAB — CBC
HCT: 36.9 % (ref 36.0–46.0)
Hemoglobin: 11.6 g/dL — ABNORMAL LOW (ref 12.0–15.0)
MCH: 29.7 pg (ref 26.0–34.0)
MCHC: 31.4 g/dL (ref 30.0–36.0)
MCV: 94.6 fL (ref 80.0–100.0)
Platelets: 260 10*3/uL (ref 150–400)
RBC: 3.9 MIL/uL (ref 3.87–5.11)
RDW: 13.8 % (ref 11.5–15.5)
WBC: 6.5 10*3/uL (ref 4.0–10.5)
nRBC: 0 % (ref 0.0–0.2)

## 2021-10-23 NOTE — ED Triage Notes (Signed)
Pt comes with c/o SOB. Pt states this started few days ago. Pt states she was on 3.5 L and MD increased it to 4L. Pt states her portable wont go up to 4 L bc it sucks out all the O2  Pt was 81% on arrival  at 2L per 1st RN. Pt placed on 4L at 95 %.  Pt states it is getting worse.

## 2021-10-23 NOTE — ED Notes (Addendum)
No answer in lobby when called for repeat vitals

## 2021-10-23 NOTE — Progress Notes (Unsigned)
[facility] [place], [place] [address]  Internal MEDICINE  Office Visit Note  Patient Name: [name]  [id number]  [id number]  Date of Service: 10/23/2021  Chief Complaint  Patient presents with   Acute Visit    SOB     HPI Eve presents for an acute sick visit for shortness of breath.  Patient is on 4 L of continuous oxygen on her machine at home.  Her portable machine can only do 2 L/min and her oxygen saturation is 95%.  But she feels very short of breath.  Patient does have congestive heart failure and may be having an exacerbation    Current Medication:  Outpatient Encounter Medications as of 10/23/2021  Medication Sig Note   albuterol (VENTOLIN HFA) 108 (90 Base) MCG/ACT inhaler Inhale 2 puffs into the lungs every 6 (six) hours as needed for wheezing or shortness of breath.    ALPRAZolam (XANAX) 0.25 MG tablet Take 1 tablet (0.25 mg total) by mouth 2 (two) times daily as needed for anxiety.    amLODipine (NORVASC) 10 MG tablet Take 1 tablet by mouth daily.    amoxicillin (AMOXIL) 500 MG capsule Take 4 cap po 1 hrs before dental procedure    atorvastatin (LIPITOR) 40 MG tablet 1 tab po qhs    bumetanide (BUMEX) 1 MG tablet TAKE 2 TABLETS BY MOUTH TWICE DAILY    Calcium Carbonate-Vitamin D (CALCIUM 600+D PO) Take 1 tablet by mouth daily.    carvedilol (COREG) 3.125 MG tablet Take 3.125 mg by mouth 2 (two) times daily with a meal. 08/04/2021: Patient taking differently per cardiology, 2 tabs twice daily   Coenzyme Q10 (COQ10) 200 MG CAPS Take 200 mg by mouth daily.    diclofenac Sodium (VOLTAREN) 1 % GEL SMARTSIG:Gram(s) Topical Twice Daily    dicyclomine (BENTYL) 10 MG capsule Take 1 capsule (10 mg total) by mouth 3 (three) times daily as needed for spasms.    ELIQUIS 5 MG TABS tablet Take 1 tablet by mouth twice daily. (Patient taking differently: Take 5 mg by mouth 2 (two) times daily.)    ferrous sulfate 325 (65 FE) MG tablet Take 2 tablets  (650 mg total) by mouth 2 (two) times daily with a meal. (Patient taking differently: Take 325 mg by mouth. Takes 4 tablets by mouth twice a day)    flecainide (TAMBOCOR) 50 MG tablet Take 1 tablet (50 mg total) by mouth 2 (two) times daily. PLEASE CONTACT OFFICE [phone] TO SCHEDULE APPOINTMENT FOR FUTURE REFILLS    Fluticasone-Umeclidin-Vilant (TRELEGY ELLIPTA) 100-62.5-25 MCG/ACT AEPB Inhale 1 puff into the lungs daily.    levothyroxine (SYNTHROID) 25 MCG tablet Take 1 tablet (25 mcg total) by mouth daily before breakfast.    lisinopril (ZESTRIL) 40 MG tablet TAKE 1 TABLET(40 MG) BY MOUTH DAILY(DOSE INCREASE)    loratadine (CLARITIN) 10 MG tablet Take 10 mg by mouth daily.    oxyCODONE-acetaminophen (PERCOCET) 7.5-325 MG tablet Take one tab in am and half in pm for severe shoulder pain    pantoprazole (PROTONIX) 40 MG tablet Take 1 tablet (40 mg total) by mouth daily.    potassium chloride (KLOR-CON) 10 MEQ tablet Take 4 tablets (40 mEq total) by mouth 2 (two) times daily.    sertraline (ZOLOFT) 50 MG tablet Take 1 tablet (50 mg total) by mouth daily.    traZODone (DESYREL) 50 MG tablet Take 1 tablet (50 mg total) by mouth at bedtime.    No facility-administered encounter medications  on file as of 10/23/2021.      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 s/p bioprosthetic valve replacement 2008 at [facility];  b. 01/2015 Echo: EF 60-65%, no rwma, Gr1 DD, mildly dil LA, nl RV fxn.   Atrial fibrillation with RVR ([place]) 01/11/2015   Atrial flutter ([place])    a. 08/2016 s/p DCCV.  Remains on flecainide 50 mg bid.   Basal skull fracture ([name]) 20 yrs ago   CHF (congestive heart failure) ([name])    Chronic respiratory failure ([name])    COPD (chronic obstructive pulmonary disease) ([place])    a. 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 2008 prior to Aortic aneurysm repair-->nl cors.   History of stress test    a. 10/2015 MV: no ischemia/infarct.   HLD (hyperlipidemia)    HTN (hypertension)    Hypothyroidism    Obesity    PAF (paroxysmal atrial fibrillation) ([name])    a. on Eliquis; b. CHADS2VASc = 3 (HTN, age x 82, female).   Paroxysmal atrial fibrillation ([name]) 01/24/2015   Right upper quadrant abdominal tenderness without rebound tenderness 03/02/2018   S/P ascending aortic aneurysm repair 2008     Vital Signs: BP 102/60    Pulse 70    Temp 98.8 F (37.1 C)    Resp 16    Ht 4\' 10"  (1.473 m)    Wt 191 lb (86.6 kg)    SpO2 95% Comment: with 2L oxygen   BMI 39.92 kg/m    Review of Systems  Constitutional:  Positive for unexpected weight change. Negative for chills and fatigue.  HENT:  Negative for congestion, rhinorrhea, sneezing and sore throat.   Eyes:  Negative for redness.  Respiratory:  Positive for shortness of breath and wheezing. Negative for cough and chest tightness.   Cardiovascular:  Positive for chest pain. Negative for palpitations and leg swelling.  Gastrointestinal:  Negative for abdominal pain, constipation, diarrhea, nausea and vomiting.  Genitourinary:  Negative for dysuria and frequency.  Musculoskeletal:  Negative for arthralgias, back pain, joint swelling and neck pain.  Skin:  Negative for rash.  Neurological: Negative.  Negative for tremors and numbness.  Hematological:  Negative for adenopathy. Does not bruise/bleed easily.  Psychiatric/Behavioral:  Negative for behavioral problems (Depression), sleep disturbance and suicidal ideas. The patient is not nervous/anxious.    Physical Exam Cardiovascular:     Rate and Rhythm: Normal rate and regular rhythm.     Heart sounds: Murmur heard.  Pulmonary:     Effort: Tachypnea, accessory muscle usage and prolonged expiration present.     Breath sounds: Examination of the right-upper field reveals wheezing. Examination of the left-upper  field reveals wheezing. Examination of the right-middle field reveals decreased breath sounds. Examination of the left-middle field reveals decreased breath sounds. Examination of the right-lower field reveals decreased breath sounds. Examination of the left-lower field reveals decreased breath sounds. Decreased breath sounds and wheezing present.  Musculoskeletal:     Right lower leg: Edema present.     Left lower leg: Edema present.      Assessment/Plan:   General Counseling: Ondria verbalizes understanding of the findings of todays visit and agrees with plan of treatment. I have discussed any further diagnostic evaluation that may  be needed or ordered today. We also reviewed her medications today. she has been encouraged to call the office with any questions or concerns that should arise related to todays visit.    Counseling:    No orders of the defined types were placed in this encounter.   No orders of the defined types were placed in this encounter.   No follow-ups on file.  [place] Controlled Substance Database was reviewed by me for overdose risk score (ORS)  Time spent:30 Minutes Time spent with patient included reviewing progress notes, labs, imaging studies, and discussing plan for follow up.   This patient was seen by Jonetta Osgood, FNP-C in collaboration with Dr. Clayborn Bigness as a part of collaborative care agreement.  Isair Inabinet R. Valetta Fuller, MSN, FNP-C Internal Medicine

## 2021-10-24 ENCOUNTER — Emergency Department
Admission: EM | Admit: 2021-10-24 | Discharge: 2021-10-24 | Disposition: A | Discharge status: Home / Self Care | Payer: Medicare Other | Attending: Emergency Medicine | Admitting: Emergency Medicine

## 2021-10-24 ENCOUNTER — Other Ambulatory Visit: Payer: Self-pay

## 2021-10-24 ENCOUNTER — Telehealth: Payer: Self-pay

## 2021-10-24 ENCOUNTER — Encounter: Payer: Self-pay | Admitting: Emergency Medicine

## 2021-10-24 DIAGNOSIS — J449 Chronic obstructive pulmonary disease, unspecified: Secondary | ICD-10-CM | POA: Insufficient documentation

## 2021-10-24 DIAGNOSIS — R0602 Shortness of breath: Secondary | ICD-10-CM | POA: Insufficient documentation

## 2021-10-24 DIAGNOSIS — Z20822 Contact with and (suspected) exposure to covid-19: Secondary | ICD-10-CM | POA: Diagnosis not present

## 2021-10-24 DIAGNOSIS — R1909 Other intra-abdominal and pelvic swelling, mass and lump: Secondary | ICD-10-CM | POA: Diagnosis not present

## 2021-10-24 DIAGNOSIS — I110 Hypertensive heart disease with heart failure: Secondary | ICD-10-CM | POA: Diagnosis not present

## 2021-10-24 DIAGNOSIS — I509 Heart failure, unspecified: Secondary | ICD-10-CM | POA: Insufficient documentation

## 2021-10-24 LAB — TROPONIN I (HIGH SENSITIVITY): Troponin I (High Sensitivity): 8 ng/L (ref <18)

## 2021-10-24 LAB — RESP PANEL BY RT-PCR (FLU A&B, COVID) ARPGX2
Influenza A by PCR: NEGATIVE
Influenza B by PCR: NEGATIVE
SARS Coronavirus 2 by RT PCR: NEGATIVE

## 2021-10-24 LAB — BASIC METABOLIC PANEL
Anion gap: 8 (ref 5–15)
BUN: 16 mg/dL (ref 8–23)
CO2: 29 mmol/L (ref 22–32)
Calcium: 9.1 mg/dL (ref 8.9–10.3)
Chloride: 99 mmol/L (ref 98–111)
Creatinine, Ser: 0.82 mg/dL (ref 0.44–1.00)
GFR, Estimated: >60 mL/min (ref >60)
Glucose, Bld: 94 mg/dL (ref 70–99)
Potassium: 3.8 mmol/L (ref 3.5–5.1)
Sodium: 136 mmol/L (ref 135–145)

## 2021-10-24 LAB — CBC WITH DIFFERENTIAL/PLATELET
Abs Immature Granulocytes: 0.02 10*3/uL (ref 0.00–0.07)
Basophils Absolute: 0 10*3/uL (ref 0.0–0.1)
Basophils Relative: 0 %
Eosinophils Absolute: 0.2 10*3/uL (ref 0.0–0.5)
Eosinophils Relative: 4 %
HCT: 35.6 % — ABNORMAL LOW (ref 36.0–46.0)
Hemoglobin: 11.3 g/dL — ABNORMAL LOW (ref 12.0–15.0)
Immature Granulocytes: 0 %
Lymphocytes Relative: 14 %
Lymphs Abs: 0.9 10*3/uL (ref 0.7–4.0)
MCH: 29.7 pg (ref 26.0–34.0)
MCHC: 31.7 g/dL (ref 30.0–36.0)
MCV: 93.4 fL (ref 80.0–100.0)
Monocytes Absolute: 0.8 10*3/uL (ref 0.1–1.0)
Monocytes Relative: 14 %
Neutro Abs: 4 10*3/uL (ref 1.7–7.7)
Neutrophils Relative %: 68 %
Platelets: 245 10*3/uL (ref 150–400)
RBC: 3.81 MIL/uL — ABNORMAL LOW (ref 3.87–5.11)
RDW: 13.7 % (ref 11.5–15.5)
WBC: 6 10*3/uL (ref 4.0–10.5)
nRBC: 0 % (ref 0.0–0.2)

## 2021-10-24 LAB — BRAIN NATRIURETIC PEPTIDE: B Natriuretic Peptide: 170.5 pg/mL — ABNORMAL HIGH (ref 0.0–100.0)

## 2021-10-24 MED ORDER — BUMETANIDE 0.25 MG/ML IJ SOLN
1.0000 mg | Freq: Once | INTRAMUSCULAR | Status: AC
  Administered 2021-10-24: 1 mg via INTRAVENOUS
  Filled 2021-10-24: qty 4

## 2021-10-24 NOTE — ED Triage Notes (Signed)
Pt comes into the ED via POV c/o [place].  Pt was seen here yesterday but ended up leaving before seeing the MD.  Pt called her primary today and they told her that she has fluid on her lungs and that she needed to come back.  Pt refusing to have any protocols completed at this time and states "I just want to see the Doctor, I already know the problem".  Pt in NAD at this time.  Pt wears 4L at baseline.

## 2021-10-24 NOTE — Telephone Encounter (Signed)
Pt called and advised that she didn't stay at the ER yesterday (10/23/21) after waiting there for 2 hours due to her being cold with 2 blankets on her and eight people in front of her to be seen and her having pain in her shoulders.  The hospital did do labs on her and a chest xray.  Per [name] she advised that she has pulmonary edema  and that she needs to return to the ER to be seen and treated

## 2021-10-24 NOTE — ED Provider Notes (Signed)
[facility] Provider Note    Event Date/Time   First MD Initiated Contact with Patient 10/24/21 1402     (approximate)   History   Shortness of Breath   HPI  [name] is a 76 y.o. female with a PMH of atrial fibrillation, CHF, COPD (on 4 L O2 at baseline), hypertension, hyperlipidemia, and obesity who presents with worsening shortness of breath over approximately last week that is not relieved by her Bumex at home.  She reports some increased swelling to her upper abdomen and states that she does not really get lower extremity swelling.  She was in the ED last night but left prior to being seen by Dr. Due to the wait time.    Physical Exam   Triage Vital Signs: ED Triage Vitals  Enc Vitals Group     BP 10/24/21 1233 108/61     Pulse Rate 10/24/21 1233 71     Resp 10/24/21 1233 (!) 21     Temp 10/24/21 1233 (!) 97.5 F (36.4 C)     Temp Source 10/24/21 1233 Oral     SpO2 10/24/21 1233 97 %     Weight 10/24/21 1232 191 lb (86.6 kg)     Height 10/24/21 1232 4\' 10"  (1.473 m)     Head Circumference --      Peak Flow --      Pain Score 10/24/21 1232 8     Pain Loc --      Pain Edu? --      Excl. in [place]? --     Most recent vital signs: Vitals:   10/24/21 1400 10/24/21 1540  BP: 126/65 121/70  Pulse: 70 70  Resp: (!) 22 (!) 21  Temp:    SpO2: 100% 100%     General: Awake, no distress.  CV:  Good peripheral perfusion.  Normal heart sounds. Resp:  Normal effort.  Diminished breath sounds bilaterally. Abd:  No distention.  Other:  Trace bilateral lower extremity edema.   ED Results / Procedures / Treatments   Labs (all labs ordered are listed, but only abnormal results are displayed) Labs Reviewed  RESP PANEL BY RT-PCR (FLU A&B, COVID) [id number]  BASIC METABOLIC PANEL  CBC WITH DIFFERENTIAL/PLATELET  BRAIN NATRIURETIC PEPTIDE  TROPONIN I (HIGH SENSITIVITY)     EKG  ED ECG REPORT I, Arta Silence, the attending physician,  personally viewed and interpreted this ECG.  Date: 10/24/2021 EKG Time: 1512 Rate: 70 Rhythm: Paced rhythm Intervals: IVCD ST/T Wave abnormalities: normal Narrative Interpretation: no evidence of acute ischemia    RADIOLOGY  I independently viewed and interpreted the chest x-ray obtained last night; there is cardiomegaly and borderline findings for interstitial edema but no focal consolidation.  PROCEDURES:  Critical Care performed: No  Procedures   MEDICATIONS ORDERED IN ED: Medications  bumetanide (BUMEX) injection 1 mg (has no administration in time range)     IMPRESSION / MDM / ASSESSMENT AND PLAN / ED COURSE  I reviewed the triage vital signs and the nursing notes.  76 year old female with PMH as noted above presents with worsening shortness of breath which she feels is similar to prior CHF exacerbations.  She is on 4 L O2 at baseline.  On exam the patient does not demonstrate acute respiratory distress.  O2 saturation is in the high 90s to 100% on her normal 4 L.  She does not have significant peripheral edema.  Differential diagnosis includes, but is not limited to,  CHF exacerbation, COPD exacerbation, acute bronchitis, viral etiology, or less likely pneumonia.  The patient initially declined lab work-up since she had some labs done last night, however after I explained that these were only basic labs and that we needed some additional studies she agreed.  Since she had a chest x-ray yesterday evening there is no indication for repeat.  This was somewhat equivocal for pulmonary vascular congestion.  We will obtain repeat basic labs, cardiac enzymes, BNP, and a respiratory panel.  I have ordered IV Bumex as the patient is allergic to Lasix.  Plan will be reassessment after she received treatment.  If her lab work-up is reassuring and she is feeling better, she may be able to go home.  The patient is on the cardiac monitor to evaluate for evidence of arrhythmia and/or  significant heart rate changes.  ----------------------------------------- 3:49 PM on 10/24/2021 -----------------------------------------  Lab work-up is pending.  I have signed the patient out to the oncoming ED physician Dr. Quentin Cornwall.   FINAL CLINICAL IMPRESSION(S) / ED DIAGNOSES   Final diagnoses:  Shortness of breath     Rx / DC Orders   ED Discharge Orders     None        Note:  This document was prepared using Dragon voice recognition software and may include unintentional dictation errors.    Arta Silence, MD 10/24/21 1549

## 2021-10-24 NOTE — ED Notes (Signed)
Pt provided discharge instructions and prescription information. Pt was given the opportunity to ask questions and questions were answered. Discharge signature not obtained in the setting of the COVID-19 pandemic in order to reduce high touch surfaces.  ° °

## 2021-10-24 NOTE — ED Provider Notes (Signed)
Patient received in signout from Dr. Cherylann Banas pending reassessment after diuresis.  Patient's blood work is reassuring.  She is declining chest x-ray.  She does not have any acute hypoxia.  She was given IV Bumex with improvement in her symptoms.  She is requesting discharge home at this time.  As I do suspect a mild component of CHF exacerbation I have recommended she take an additional Bumex every morning this weekend and to follow-up with CHF clinic on Monday.  We discussed strict return precautions.  Patient agreeable to plan.   Merlyn Lot, MD 10/24/21 [phone]

## 2021-10-24 NOTE — ED Notes (Signed)
IV attempt x 1 to R hand without success.

## 2021-10-24 NOTE — Discharge Instructions (Signed)
This weekend, please take one extra bumex table in the morning.  Please follow up with Darylene Price on Monday.  Return if you start feeling worsening shortness of breath

## 2021-10-27 ENCOUNTER — Other Ambulatory Visit: Payer: Self-pay

## 2021-10-27 ENCOUNTER — Ambulatory Visit (HOSPITAL_BASED_OUTPATIENT_CLINIC_OR_DEPARTMENT_OTHER): Payer: Medicare Other | Admitting: Family

## 2021-10-27 ENCOUNTER — Emergency Department: Payer: Medicare Other

## 2021-10-27 ENCOUNTER — Encounter: Payer: Self-pay | Admitting: Emergency Medicine

## 2021-10-27 ENCOUNTER — Emergency Department
Admission: EM | Admit: 2021-10-27 | Discharge: 2021-10-27 | Disposition: A | Discharge status: Home / Self Care | Payer: Medicare Other | Attending: Student in an Organized Health Care Education/Training Program | Admitting: Student in an Organized Health Care Education/Training Program

## 2021-10-27 ENCOUNTER — Encounter: Payer: Self-pay | Admitting: Family

## 2021-10-27 VITALS — BP 110/54 | HR 74 | Resp 22 | Ht <= 58 in | Wt 191.0 lb

## 2021-10-27 DIAGNOSIS — Z9981 Dependence on supplemental oxygen: Secondary | ICD-10-CM | POA: Insufficient documentation

## 2021-10-27 DIAGNOSIS — R140 Abdominal distension (gaseous): Secondary | ICD-10-CM | POA: Insufficient documentation

## 2021-10-27 DIAGNOSIS — M25512 Pain in left shoulder: Secondary | ICD-10-CM | POA: Insufficient documentation

## 2021-10-27 DIAGNOSIS — F419 Anxiety disorder, unspecified: Secondary | ICD-10-CM | POA: Insufficient documentation

## 2021-10-27 DIAGNOSIS — R5383 Other fatigue: Secondary | ICD-10-CM | POA: Insufficient documentation

## 2021-10-27 DIAGNOSIS — E079 Disorder of thyroid, unspecified: Secondary | ICD-10-CM | POA: Insufficient documentation

## 2021-10-27 DIAGNOSIS — J449 Chronic obstructive pulmonary disease, unspecified: Secondary | ICD-10-CM

## 2021-10-27 DIAGNOSIS — Z7901 Long term (current) use of anticoagulants: Secondary | ICD-10-CM | POA: Insufficient documentation

## 2021-10-27 DIAGNOSIS — I110 Hypertensive heart disease with heart failure: Secondary | ICD-10-CM | POA: Insufficient documentation

## 2021-10-27 DIAGNOSIS — I10 Essential (primary) hypertension: Secondary | ICD-10-CM

## 2021-10-27 DIAGNOSIS — I5022 Chronic systolic (congestive) heart failure: Secondary | ICD-10-CM | POA: Diagnosis not present

## 2021-10-27 DIAGNOSIS — I480 Paroxysmal atrial fibrillation: Secondary | ICD-10-CM

## 2021-10-27 DIAGNOSIS — Z87891 Personal history of nicotine dependence: Secondary | ICD-10-CM | POA: Insufficient documentation

## 2021-10-27 DIAGNOSIS — E785 Hyperlipidemia, unspecified: Secondary | ICD-10-CM | POA: Insufficient documentation

## 2021-10-27 DIAGNOSIS — I509 Heart failure, unspecified: Secondary | ICD-10-CM | POA: Diagnosis not present

## 2021-10-27 DIAGNOSIS — R42 Dizziness and giddiness: Secondary | ICD-10-CM | POA: Insufficient documentation

## 2021-10-27 DIAGNOSIS — R0602 Shortness of breath: Secondary | ICD-10-CM | POA: Insufficient documentation

## 2021-10-27 DIAGNOSIS — R0600 Dyspnea, unspecified: Secondary | ICD-10-CM | POA: Insufficient documentation

## 2021-10-27 MED ORDER — PREDNISONE 20 MG PO TABS
60.0000 mg | ORAL_TABLET | Freq: Once | ORAL | Status: AC
  Administered 2021-10-27: 60 mg via ORAL
  Filled 2021-10-27: qty 3

## 2021-10-27 MED ORDER — PREDNISONE 20 MG PO TABS: 40.0000 mg | ORAL_TABLET | Freq: Every day | ORAL | 0 refills | Status: AC

## 2021-10-27 MED ORDER — IPRATROPIUM-ALBUTEROL 0.5-2.5 (3) MG/3ML IN SOLN
3.0000 mL | Freq: Once | RESPIRATORY_TRACT | Status: AC
Start: 2021-10-27 — End: 2021-10-27
  Administered 2021-10-27: 3 mL via RESPIRATORY_TRACT
  Filled 2021-10-27: qty 3

## 2021-10-27 MED ORDER — IPRATROPIUM-ALBUTEROL 0.5-2.5 (3) MG/3ML IN SOLN
3.0000 mL | Freq: Once | RESPIRATORY_TRACT | Status: AC
  Administered 2021-10-27: 3 mL via RESPIRATORY_TRACT
  Filled 2021-10-27: qty 3

## 2021-10-27 MED ORDER — ALBUTEROL SULFATE HFA 108 (90 BASE) MCG/ACT IN AERS: 2.0000 | INHALATION_SPRAY | Freq: Four times a day (QID) | RESPIRATORY_TRACT | 3 refills | Status: DC | PRN

## 2021-10-27 NOTE — ED Notes (Signed)
First encounter with pt to D/C.  D/C, new RX and reasons to return to ED discussed with pt, verbalized understanding. NAD noted.

## 2021-10-27 NOTE — Progress Notes (Signed)
Patient ID: [name], female    DOB: June 28, 1946, 77 y.o.   MRN: [id number]  HPI  [name] is a 76 y/o female with a history of hyperlipidemia, HTN, thyroid disease, COPD, left ear deafness, atrial fibrillation, previous tobacco use and chronic heart failure.   Echo report from 05/16/21 reviewed and showed an EF of 45% along with mild LVH. Echo report from 02/07/20 reviewed and showed an EF of >55% along with severe LAE. Catheterization done 02/07/20 but unable to view the results.   Was in the ED 10/24/21 due to shortness of breath. Treated with IV bumex with additional oral bumex over the weekend and she was released.    She presents today for an acute visit with a chief complaint of moderate shortness of breath even at rest.  She says that she's chronically SOB but it's been worsening over the last 2 weeks. She was in the ED 3 days ago and was given IV bumex with increase in her oral dose as well. She says that she's not any better and, in fact, feels even worse than she did. She has associated fatigue, cough, wheezing, abdominal distention, light-headedness, left shoulder pain, anxiety and weight gain along with this. She denies difficulty sleeping (unless her shoulder is really hurting her), palpitations, pedal edema or chest pain.   She is tearful in the office because she feels so bad and feels much worse than a few days ago and doesn't understand why she's no better. She is concerned that she has fluid in her lungs or pneumonia.  Has been taking 3mg  bumex AM/ 2mg  PM over the last 3 days without any relief.    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 s/p bioprosthetic valve replacement 2008 at [facility];  b. 01/2015 Echo: EF 60-65%, no rwma, Gr1 DD, mildly dil LA, nl RV fxn.   Atrial fibrillation with RVR ([place]) 01/11/2015   Atrial flutter ([place])    a. 08/2016 s/p DCCV.  Remains on flecainide 50 mg bid.   Basal skull fracture ([name]) 20 yrs ago    CHF (congestive heart failure) ([name])    Chronic respiratory failure ([name])    COPD (chronic obstructive pulmonary disease) ([place])    a. 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 2008 prior to Aortic aneurysm repair-->nl cors.   History of stress test    a. 10/2015 MV: no ischemia/infarct.   HLD (hyperlipidemia)    HTN (hypertension)    Hypothyroidism    Obesity    PAF (paroxysmal atrial fibrillation) ([name])    a. on Eliquis; b. CHADS2VASc = 3 (HTN, age x 78, female).   Paroxysmal atrial fibrillation ([place]) 01/24/2015   Right upper quadrant abdominal tenderness without rebound tenderness 03/02/2018   S/P ascending aortic aneurysm repair 2008   Past Surgical History:  Procedure Laterality Date   ABDOMINAL AORTIC ANEURYSM REPAIR  2008   ABDOMINAL HYSTERECTOMY     AORTIC VALVE REPLACEMENT  2008   CARDIAC CATHETERIZATION     [facility]   CARDIOVERSION N/A 08/15/2018   Procedure: CARDIOVERSION;  Surgeon: [name], MD;  Location: [facility] ORS;  Service: Cardiovascular;  Laterality: N/A;   CARDIOVERSION N/A 11/14/2018   Procedure: CARDIOVERSION (CATH LAB);  Surgeon: [name],  MD;  Location: [facility] ORS;  Service: Cardiovascular;  Laterality: N/A;   CARDIOVERSION N/A 06/05/2019   Procedure: CARDIOVERSION;  Surgeon: [name], MD;  Location: [place] [facility];  Service: Cardiovascular;  Laterality: N/A;   CARDIOVERSION N/A 08/14/2019   Procedure: CARDIOVERSION;  Surgeon: [name], MD;  Location: [place] [facility];  Service: Cardiovascular;  Laterality: N/A;   CARDIOVERSION N/A 11/09/2019   Procedure: CARDIOVERSION;  Surgeon: Isaias Cowman, MD;  Location: [place] [facility];  Service: Cardiovascular;  Laterality: N/A;   CARDIOVERSION N/A 01/17/2020   Procedure: CARDIOVERSION;  Surgeon: Isaias Cowman, MD;  Location: [facility] ORS;  Service: Cardiovascular;  Laterality: N/A;   CARPAL TUNNEL  RELEASE     ELECTROPHYSIOLOGIC STUDY N/A 08/17/2016   Procedure: Cardioversion;  Surgeon: [name], MD;  Location: [facility] ORS;  Service: Cardiovascular;  Laterality: N/A;   TUMOR EXCISION Left    x3 (arm)   Family History  Problem Relation Age of Onset   Stroke Father    Stroke Paternal Grandmother    Social History   Tobacco Use   Smoking status: Former    Types: Cigarettes   Smokeless tobacco: Never  Substance Use Topics   Alcohol use: No   Allergies  Allergen Reactions   Benadryl [Diphenhydramine Hcl (Sleep)] Palpitations   Cetirizine Palpitations   Lasix [Furosemide] Rash   Levaquin [Levofloxacin In D5w] Other (See Comments)    Reaction:  Fatigue and muscle soreness   Meloxicam Rash   Soy Allergy Hives and Nausea And Vomiting   Sulfa Antibiotics Rash   Prior to Admission medications   Medication Sig Start Date End Date Taking? Authorizing Provider  albuterol (VENTOLIN HFA) 108 (90 Base) MCG/ACT inhaler Inhale 2 puffs into the lungs every 6 (six) hours as needed for wheezing or shortness of breath. 10/14/21  Yes Abernathy, Yetta Flock, NP  ALPRAZolam (XANAX) 0.25 MG tablet Take 1 tablet (0.25 mg total) by mouth 2 (two) times daily as needed for anxiety. 04/21/21  Yes [name], MD  amLODipine (NORVASC) 10 MG tablet Take 1 tablet by mouth daily. 07/08/21 07/08/22 Yes [provider]  amoxicillin (AMOXIL) 500 MG capsule Take 4 cap po 1 hrs before dental procedure 10/13/21  Yes Abernathy, Yetta Flock, NP  atorvastatin (LIPITOR) 40 MG tablet 1 tab po qhs 10/01/21  Yes [name], MD  bumetanide (BUMEX) 1 MG tablet TAKE 3 TABLETS BY MOUTH in AM & 2 tablets PM 08/31/21  Yes Darylene Price A, FNP  Calcium Carbonate-Vitamin D (CALCIUM 600+D PO) Take 1 tablet by mouth daily.   Yes [provider]  carvedilol (COREG) 3.125 MG tablet Take 3.125 mg by mouth 2 (two) times daily with a meal.   Yes [provider]  Coenzyme Q10 (COQ10) 200 MG CAPS Take 200 mg by  mouth daily.   Yes [provider]  diclofenac Sodium (VOLTAREN) 1 % GEL SMARTSIG:Gram(s) Topical Twice Daily 09/09/20  Yes [provider]  dicyclomine (BENTYL) 10 MG capsule Take 1 capsule (10 mg total) by mouth 3 (three) times daily as needed for spasms. 07/22/21  Yes Lucilla Lame, MD  ELIQUIS 5 MG TABS tablet Take 1 tablet by mouth twice daily. Patient taking differently: Take 5 mg by mouth 2 (two) times daily. 10/26/19  Yes [name], MD  ferrous sulfate 325 (65 FE) MG tablet Take 2 tablets (650 mg total) by mouth 2 (two) times daily with a meal. Patient taking differently: Take 325 mg by mouth. Takes 4 tablets by mouth twice  a day 01/30/21  Yes Jennye Boroughs, MD  flecainide (TAMBOCOR) 50 MG tablet Take 1 tablet (50 mg total) by mouth 2 (two) times daily. PLEASE CONTACT OFFICE [phone] TO SCHEDULE APPOINTMENT FOR FUTURE REFILLS 02/23/20  Yes Deboraha Sprang, MD  Fluticasone-Umeclidin-Vilant (TRELEGY ELLIPTA) 100-62.5-25 MCG/ACT AEPB Inhale 1 puff into the lungs daily. 10/14/21  Yes Abernathy, Yetta Flock, NP  levothyroxine (SYNTHROID) 25 MCG tablet Take 1 tablet (25 mcg total) by mouth daily before breakfast. 09/29/21  Yes [name], MD  lisinopril (ZESTRIL) 40 MG tablet TAKE 1 TABLET(40 MG) BY MOUTH DAILY(DOSE INCREASE) 09/29/21  Yes Darylene Price A, FNP  loratadine (CLARITIN) 10 MG tablet Take 10 mg by mouth daily.   Yes [provider]  oxyCODONE-acetaminophen (PERCOCET) 7.5-325 MG tablet Take one tab in am and half in pm for severe shoulder pain 10/14/21  Yes Abernathy, [name], NP  pantoprazole (PROTONIX) 40 MG tablet Take 1 tablet (40 mg total) by mouth daily. 10/01/21  Yes [name], MD  potassium chloride (KLOR-CON) 10 MEQ tablet Take 4 tablets (40 mEq total) by mouth 2 (two) times daily. 10/06/21  Yes Abernathy, Yetta Flock, NP  sertraline (ZOLOFT) 50 MG tablet Take 1 tablet (50 mg total) by mouth daily. 10/06/21  Yes Abernathy, Yetta Flock, NP  traZODone (DESYREL) 50  MG tablet Take 1 tablet (50 mg total) by mouth at bedtime. 10/01/21   [name], MD   Review of Systems  Constitutional:  Positive for appetite change (decreased) and fatigue (tire easily).  HENT:  Positive for hearing loss. Negative for congestion and sore throat.   Eyes: Negative.   Respiratory:  Positive for cough, shortness of breath (easily) and wheezing (worsening).   Cardiovascular:  Negative for chest pain, palpitations and leg swelling.  Gastrointestinal:  Positive for abdominal distention. Negative for abdominal pain.  Endocrine: Negative.   Genitourinary: Negative.   Musculoskeletal:  Positive for arthralgias (bilateral shoulder pain). Negative for back pain.  Skin: Negative.   Allergic/Immunologic: Negative.   Neurological:  Positive for light-headedness (at times). Negative for dizziness.  Hematological:  Negative for adenopathy. Does not bruise/bleed easily.  Psychiatric/Behavioral:  Positive for dysphoric mood and sleep disturbance (trouble sleeping due to shoulder pain; wearing cpap at bedtime). The patient is nervous/anxious.    Vitals:   10/27/21 1143  BP: (!) 110/54  Pulse: 74  Resp: (!) 22  SpO2: 100%  Weight: 191 lb (86.6 kg)  Height: 4\' 10"  (1.473 m)   Wt Readings from Last 3 Encounters:  10/27/21 190 lb 14.7 oz (86.6 kg)  10/27/21 191 lb (86.6 kg)  10/24/21 191 lb (86.6 kg)   Lab Results  Component Value Date   CREATININE 0.82 10/24/2021   CREATININE 0.82 10/23/2021   CREATININE 0.98 06/27/2021   Physical Exam Vitals and nursing note reviewed.  Constitutional:      Appearance: Normal appearance.  HENT:     Head: Normocephalic and atraumatic.     Left Ear: Decreased hearing noted.  Cardiovascular:     Rate and Rhythm: Normal rate and regular rhythm.  Pulmonary:     Effort: Pulmonary effort is normal. Tachypnea present. No respiratory distress.     Breath sounds: No wheezing or rales.  Abdominal:     General: There is no distension.      Palpations: Abdomen is soft.  Musculoskeletal:     Cervical back: Normal range of motion and neck supple.     Right lower leg: No edema.     Left  lower leg: No edema.  Skin:    General: Skin is warm and dry.  Neurological:     General: No focal deficit present.     Mental Status: She is alert and oriented to person, place, and time.  Psychiatric:        Mood and Affect: Mood is anxious.        Behavior: Behavior normal.        Thought Content: Thought content normal.   Assessment & Plan:  1: Chronic heart failure with mildly reduced ejection fraction- - NYHA class IV - euvolemic today - weighing daily; reminded to call for an overnight weight gain of >2 pounds or a weekly weight gain of >5 pounds - weight up 6 pounds from last visit here 3 weeks ago - not adding salt and is using pepper for seasoning; reviewed the importance of closely following a low sodium diet  - had telemedicine visit with cardiothoracic provider (Burkett) 07/23/21 for aortic valve - on GDMT of lisinopril & carvedilol - has had previous valve surgery  - BNP 10/24/21 was 170.5  2: HTN- - BP looks good (110/74) - saw PCP (Abernathy) 10/23/21 - BMP 10/24/21 reviewed and showed sodium 136, potassium 3.8, creatinine 0.82 and GFR >60  3: severe COPD- - wearing oxygen at 4L around the clock at home; when she comes to appointments, she has to put her portable tank on 2L or it'll run out - saw pulmonology Humphrey Rolls) 05/13/21  4: Atrial fibrillation-  - 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 Lungs are CTA without pedal edema. Weight is up. She is working so hard to breathe with what feels like her COPD/ anxiety that she's tearful in the office saying that she can't go on like this because she's breathing so hard. Reports sleeping well at night unless her shoulder hurts her.  Explained  that I didn't think her symptoms were due to her HF because of her physical exam and she's been taking increased doses of bumex since her ED visit without any relief and, in fact, she feels worse. Explained that I felt it was best for her to return to the ED for further evaluation and she agreed. Hospital volunteer escorted patient to the ED via a wheelchair.   F/U here pending ED disposition.

## 2021-10-27 NOTE — ED Triage Notes (Signed)
Pt here with SOB, pt states this the 3rd time that she has been to this facility. Pt states she has fluid on her lungs. Pt states she was sent by her primary. Pt wears 4L BNC. Pt refusing labs and EKG. Pt NAD in triage.

## 2021-10-27 NOTE — ED Provider Notes (Signed)
[facility] Provider Note    Event Date/Time   First MD Initiated Contact with Patient 10/27/21 1526     (approximate)   History   Shortness of Breath   HPI  [name] is a 76 y.o. female patient presents to the ER from heart failure clinic due to persistent shortness of breath.  She does not feel any worse than when she was previously seen.  Still having some dyspnea.  She did increase her Bumex as described and states that she did have increased urine output over the weekend.  She is denying any chest pain.  Does have a history of COPD but states that she has not used her nebulizer in quite some time.     Physical Exam   Triage Vital Signs: ED Triage Vitals  Enc Vitals Group     BP 10/27/21 1236 122/67     Pulse Rate 10/27/21 1236 70     Resp 10/27/21 1236 18     Temp 10/27/21 1236 98.5 F (36.9 C)     Temp Source 10/27/21 1236 Oral     SpO2 10/27/21 1236 98 %     Weight 10/27/21 1235 190 lb 14.7 oz (86.6 kg)     Height 10/27/21 1235 4\' 10"  (1.473 m)     Head Circumference --      Peak Flow --      Pain Score 10/27/21 1235 0     Pain Loc --      Pain Edu? --      Excl. in [place]? --     Most recent vital signs: Vitals:   10/27/21 1236 10/27/21 1547  BP: 122/67 120/70  Pulse: 70 70  Resp: 18 18  Temp: 98.5 F (36.9 C)   SpO2: 98% 98%     Constitutional: Alert  Eyes: Conjunctivae are normal.  Head: Atraumatic. Nose: No congestion/rhinnorhea. Mouth/Throat: Mucous membranes are moist.   Neck: Painless ROM.  Cardiovascular:   Good peripheral circulation. Respiratory: Normal respiratory effort.  Coarse bibasilar breathsounds Gastrointestinal: Soft and nontender.  Musculoskeletal:  no deformity Neurologic:  MAE spontaneously. No gross focal neurologic deficits are appreciated.  Skin:  Skin is warm, dry and intact. No rash noted. Psychiatric: Mood and affect are normal. Speech and behavior are normal.    ED Results / Procedures  / Treatments   Labs (all labs ordered are listed, but only abnormal results are displayed) Labs Reviewed  CBC  BASIC METABOLIC PANEL     EKG  Refused by patient   RADIOLOGY Please see ED Course for my review and interpretation.  I personally reviewed all radiographic images ordered to evaluate for the above acute complaints and reviewed radiology reports and findings.  These findings were personally discussed with the patient.  Please see medical record for radiology report.    PROCEDURES:  Critical Care performed: No  Procedures   MEDICATIONS ORDERED IN ED: Medications  ipratropium-albuterol (DUONEB) 0.5-2.5 (3) MG/3ML nebulizer solution 3 mL (3 mLs Nebulization Given 10/27/21 1626)  ipratropium-albuterol (DUONEB) 0.5-2.5 (3) MG/3ML nebulizer solution 3 mL (3 mLs Nebulization Given 10/27/21 1626)  predniSONE (DELTASONE) tablet 60 mg (60 mg Oral Given 10/27/21 1648)     IMPRESSION / MDM / ASSESSMENT AND PLAN / ED COURSE  I reviewed the triage vital signs and the nursing notes.  Differential diagnosis includes, but is not limited to, Asthma, copd, CHF, pna, ptx, malignancy, Pe, anemia  Presents to the ER for evaluation of persistent shortness of breath.  Does not feel worse than the last time she was here.  Denies any chest pain or pressure.  No nausea or vomiting.  No fevers.  She did increase her diuretics and felt some improvement but still having some persistent shortness of breath.  Based on her chest x-ray by my review I do not believe this is acute heart failure.  She again is refusing labs and EKG which significantly limits our ability to diagnose and treat her symptoms.  I informed her my recommendation for repeat labs EKG and monitoring.  Despite my explanation she is refusing this but does agree to trial nebulizers  Clinical Course as of 10/27/21 1738  Mon Oct 27, 2021  1737 States that she is feeling significantly improved after  nebulizer and prednisone.  Again she is refusing labs or EKG.  She demonstrates understanding that this is [place] and that is my strong recommendation that we recheck labs EKG and even observe her in the hospital.  States that she wants to go home and understand the risks associated with her presenting symptoms and possible etiologies of her symptoms going undiagnosed could resulting in worsenign symptoms, disability and even death.  She ambulated without any hypoxia.  States she has follow-up for pulmonary function testing this week and is not interested in staying any longer.  Does not appear consistent with heart failure given improvement after nebulizer suspect some bronchitis or COPD. [PR]    Clinical Course User Index [PR] Merlyn Lot, MD     FINAL CLINICAL IMPRESSION(S) / ED DIAGNOSES   Final diagnoses:  Shortness of breath     Rx / DC Orders   ED Discharge Orders          Ordered    albuterol (VENTOLIN HFA) 108 (90 Base) MCG/ACT inhaler  Every 6 hours PRN        10/27/21 1738    predniSONE (DELTASONE) 20 MG tablet  Daily        10/27/21 1738             Note:  This document was prepared using Dragon voice recognition software and may include unintentional dictation errors.    Merlyn Lot, MD 10/27/21 [phone]

## 2021-10-27 NOTE — ED Notes (Signed)
See triage note presents with some SOB  states this is her 3 rd visit for the same    states she was told to return to ED by PCP   states shse is increasingly SOB with exertion  denies any fever

## 2021-10-27 NOTE — Patient Instructions (Signed)
Continue weighing daily and call for an overnight weight gain of 3 pounds or more or a weekly weight gain of more than 5 pounds.  °

## 2021-10-28 ENCOUNTER — Other Ambulatory Visit: Payer: Medicare Other

## 2021-10-28 ENCOUNTER — Ambulatory Visit: Payer: Medicare Other | Admitting: Internal Medicine

## 2021-10-29 ENCOUNTER — Ambulatory Visit: Payer: Medicare Other | Admitting: Internal Medicine

## 2021-10-29 ENCOUNTER — Other Ambulatory Visit: Payer: Self-pay

## 2021-10-29 DIAGNOSIS — G4733 Obstructive sleep apnea (adult) (pediatric): Secondary | ICD-10-CM | POA: Diagnosis not present

## 2021-10-29 DIAGNOSIS — R0602 Shortness of breath: Secondary | ICD-10-CM

## 2021-11-04 ENCOUNTER — Encounter: Payer: Self-pay | Admitting: Nurse Practitioner

## 2021-11-04 ENCOUNTER — Encounter: Payer: Self-pay | Admitting: Internal Medicine

## 2021-11-04 ENCOUNTER — Ambulatory Visit: Payer: Medicare Other | Admitting: Internal Medicine

## 2021-11-04 ENCOUNTER — Other Ambulatory Visit: Payer: Self-pay

## 2021-11-04 VITALS — BP 118/90 | HR 73 | Temp 98.4°F | Resp 16 | Ht <= 58 in | Wt 187.6 lb

## 2021-11-04 DIAGNOSIS — R0602 Shortness of breath: Secondary | ICD-10-CM

## 2021-11-04 DIAGNOSIS — J9611 Chronic respiratory failure with hypoxia: Secondary | ICD-10-CM | POA: Diagnosis not present

## 2021-11-04 DIAGNOSIS — I480 Paroxysmal atrial fibrillation: Secondary | ICD-10-CM

## 2021-11-04 DIAGNOSIS — G4733 Obstructive sleep apnea (adult) (pediatric): Secondary | ICD-10-CM | POA: Diagnosis not present

## 2021-11-04 DIAGNOSIS — M545 Low back pain, unspecified: Secondary | ICD-10-CM

## 2021-11-04 DIAGNOSIS — G8929 Other chronic pain: Secondary | ICD-10-CM

## 2021-11-04 DIAGNOSIS — F321 Major depressive disorder, single episode, moderate: Secondary | ICD-10-CM | POA: Diagnosis not present

## 2021-11-04 MED ORDER — SERTRALINE HCL 100 MG PO TABS: ORAL_TABLET | ORAL | 2 refills | Status: DC

## 2021-11-04 MED ORDER — APIXABAN 5 MG PO TABS: 5.0000 mg | ORAL_TABLET | Freq: Two times a day (BID) | ORAL | 2 refills | Status: DC

## 2021-11-04 MED ORDER — TRAMADOL HCL 50 MG PO TABS: ORAL_TABLET | ORAL | 1 refills | Status: DC

## 2021-11-04 NOTE — Progress Notes (Signed)
Gave pt a written note explaining some changes to her medications per [facility].   Tramadol 50 mg twice a day.  Zoloft increased to 100 mg once a day.  Pt still has 50 mg tablets and she was informed to take 2 (two) of the 50 mg tablets once a day and once she is finished with them she is to call the pharmacy and fill the prescription for the 100 mg zoloft.    Pt also informed not to take the tramadol and the oxycodone together and if both are needed to take at least 4 hours apart.  Pt given name of NIV: non invasive ventilation machine/vest  Pt advised she uses [facility] for her CPAP machine and home oxygen, [employer] for her portable oxygen

## 2021-11-04 NOTE — Progress Notes (Signed)
[facility] [place], [place] [id number]  Internal MEDICINE  Office Visit Note  Patient Name: [name]  [id number]  [id number]  Date of Service: 11/11/2021  Chief Complaint  Patient presents with   Follow-up   Shortness of Breath    Having trouble breathing for several months now and can't figure out what is going on    HPI ED visit follow up  Patient is being seen today for worsening shortness of breath patient has been in and out of the hospital for the same reason. Patient has complicated past medical history for severe aortic stenosis status post bioprosthetic AV, chronic respiratory failure on 3 L of oxygen, paroxysmal atrial fibrillation status post multiple cardioversions on flecainide and on chronic anticoagulation with Eliquis. Patient has been desaturating with ambulation and has to increase her oxygen to between 5 to 6 L her recent echo and Myoview has been normal limits her last cardiac catheterization was in 2008 which was normal. Patient does have COPD her recent PFTs were canceled due to her inability to breathe She has been admitted with HF in the past  Is also complaining of back pain and is hard for her to walk without pain or other problem is patient has been feeling really depressed about her breathing and her her pain in her back Current Medication: No facility-administered encounter medications on file as of 11/04/2021.   Outpatient Encounter Medications as of 11/04/2021  Medication Sig Note   albuterol (VENTOLIN HFA) 108 (90 Base) MCG/ACT inhaler Inhale 2 puffs into the lungs every 6 (six) hours as needed for wheezing or shortness of breath.    ALPRAZolam (XANAX) 0.25 MG tablet Take 1 tablet (0.25 mg total) by mouth 2 (two) times daily as needed for anxiety.    amLODipine (NORVASC) 10 MG tablet Take 1 tablet by mouth daily.    amoxicillin (AMOXIL) 500 MG capsule Take 4 cap po 1 hrs before dental procedure    atorvastatin (LIPITOR) 40  MG tablet 1 tab po qhs    bumetanide (BUMEX) 1 MG tablet TAKE 2 TABLETS BY MOUTH TWICE DAILY    Calcium Carbonate-Vitamin D (CALCIUM 600+D PO) Take 1 tablet by mouth daily.    carvedilol (COREG) 3.125 MG tablet Take 3.125 mg by mouth 2 (two) times daily with a meal. 08/04/2021: Patient taking differently per cardiology, 2 tabs twice daily   Coenzyme Q10 (COQ10) 200 MG CAPS Take 200 mg by mouth daily.    diclofenac Sodium (VOLTAREN) 1 % GEL SMARTSIG:Gram(s) Topical Twice Daily    dicyclomine (BENTYL) 10 MG capsule Take 1 capsule (10 mg total) by mouth 3 (three) times daily as needed for spasms.    ferrous sulfate 325 (65 FE) MG tablet Take 2 tablets (650 mg total) by mouth 2 (two) times daily with a meal. (Patient taking differently: Take 325 mg by mouth. Takes 4 tablets by mouth twice a day)    flecainide (TAMBOCOR) 50 MG tablet Take 1 tablet (50 mg total) by mouth 2 (two) times daily. PLEASE CONTACT OFFICE [phone] TO SCHEDULE APPOINTMENT FOR FUTURE REFILLS    Fluticasone-Umeclidin-Vilant (TRELEGY ELLIPTA) 100-62.5-25 MCG/ACT AEPB Inhale 1 puff into the lungs daily.    levothyroxine (SYNTHROID) 25 MCG tablet Take 1 tablet (25 mcg total) by mouth daily before breakfast.    lisinopril (ZESTRIL) 40 MG tablet TAKE 1 TABLET(40 MG) BY MOUTH DAILY(DOSE INCREASE)    loratadine (CLARITIN) 10 MG tablet Take 10 mg by mouth daily.  oxyCODONE-acetaminophen (PERCOCET) 7.5-325 MG tablet Take one tab in am and half in pm for severe shoulder pain    pantoprazole (PROTONIX) 40 MG tablet Take 1 tablet (40 mg total) by mouth daily.    potassium chloride (KLOR-CON) 10 MEQ tablet Take 4 tablets (40 mEq total) by mouth 2 (two) times daily.    traMADol (ULTRAM) 50 MG tablet Take one tab po bid for pain    traZODone (DESYREL) 50 MG tablet Take 1 tablet (50 mg total) by mouth at bedtime.    [DISCONTINUED] ELIQUIS 5 MG TABS tablet Take 1 tablet by mouth twice daily. (Patient taking differently: Take 5 mg by mouth 2  (two) times daily.)    [DISCONTINUED] sertraline (ZOLOFT) 50 MG tablet Take 1 tablet (50 mg total) by mouth daily.    apixaban (ELIQUIS) 5 MG TABS tablet Take 1 tablet (5 mg total) by mouth 2 (two) times daily.    sertraline (ZOLOFT) 100 MG tablet Take one tab a day for Anxiety     Surgical History: Past Surgical History:  Procedure Laterality Date   ABDOMINAL AORTIC ANEURYSM REPAIR  2008   ABDOMINAL HYSTERECTOMY     AORTIC VALVE REPLACEMENT  2008   CARDIAC CATHETERIZATION     [place]   CARDIOVERSION N/A 08/15/2018   Procedure: CARDIOVERSION;  Surgeon: [name], MD;  Location: [facility];  Service: Cardiovascular;  Laterality: N/A;   CARDIOVERSION N/A 11/14/2018   Procedure: CARDIOVERSION (CATH LAB);  Surgeon: [name], MD;  Location: [facility];  Service: Cardiovascular;  Laterality: N/A;   CARDIOVERSION N/A 06/05/2019   Procedure: CARDIOVERSION;  Surgeon: [name], MD;  Location: [facility];  Service: Cardiovascular;  Laterality: N/A;   CARDIOVERSION N/A 08/14/2019   Procedure: CARDIOVERSION;  Surgeon: [name], MD;  Location: [place] [facility];  Service: Cardiovascular;  Laterality: N/A;   CARDIOVERSION N/A 11/09/2019   Procedure: CARDIOVERSION;  Surgeon: Isaias Cowman, MD;  Location: [facility];  Service: Cardiovascular;  Laterality: N/A;   CARDIOVERSION N/A 01/17/2020   Procedure: CARDIOVERSION;  Surgeon: Isaias Cowman, MD;  Location: [facility];  Service: Cardiovascular;  Laterality: N/A;   CARPAL TUNNEL RELEASE     ELECTROPHYSIOLOGIC STUDY N/A 08/17/2016   Procedure: Cardioversion;  Surgeon: [name], MD;  Location: [facility];  Service: Cardiovascular;  Laterality: N/A;   TUMOR EXCISION Left    x3 (arm)    Medical History: Past Medical History:  Diagnosis Date   Aortic 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 s/p bioprosthetic valve replacement 2008 [facility];  b. 01/2015 Echo: EF 60-65%, no rwma, Gr1 DD, mildly dil LA, nl RV fxn.   Atrial  fibrillation with RVR ([place]) 01/11/2015   Atrial flutter ([place])    a. 08/2016 s/p DCCV.  Remains on flecainide 50 mg bid.   Basal skull fracture ([name]) 20 yrs ago   CHF (congestive heart failure) ([name])    Chronic respiratory failure ([name])    COPD (chronic obstructive pulmonary disease) ([place])    a. 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 2008 prior to Aortic aneurysm repair-->nl cors.   History of stress test    a. 10/2015 MV: no ischemia/infarct.   HLD (hyperlipidemia)    HTN (hypertension)    Hypothyroidism    Obesity    PAF (paroxysmal  atrial fibrillation) ([place])    a. on Eliquis; b. CHADS2VASc = 3 (HTN, age x 1, female).   Paroxysmal atrial fibrillation ([place]) 01/24/2015   Right upper quadrant abdominal tenderness without rebound tenderness 03/02/2018   S/P ascending aortic aneurysm repair 2008    Family History: Family History  Problem Relation Age of Onset   Stroke Father    Stroke Paternal Grandmother     Social History   Socioeconomic History   Marital status: Single    Spouse name: Not on file   Number of children: Not on file   Years of education: Not on file   Highest education level: Not on file  Occupational History   Not on file  Tobacco Use   Smoking status: Former    Types: Cigarettes   Smokeless tobacco: Never  Vaping Use   Vaping Use: Never used  Substance and Sexual Activity   Alcohol use: No   Drug use: No   Sexual activity: Never    Birth control/protection: Surgical  Other Topics Concern   Not on file  Social History Narrative   Not on file   Social Determinants of Health   Financial Resource Strain: Low Risk    Difficulty of Paying Living Expenses: Not very hard  Food Insecurity: Not on file  Transportation Needs: Not on file  Physical Activity: Not on file  Stress: Not on file  Social Connections: Not on file  Intimate  Partner Violence: Not on file      Review of Systems  Constitutional:  Negative for chills, fatigue and unexpected weight change.  HENT:  Positive for postnasal drip. Negative for congestion, rhinorrhea, sneezing and sore throat.   Eyes:  Negative for redness.  Respiratory:  Positive for shortness of breath. Negative for cough and chest tightness.   Cardiovascular:  Negative for chest pain and palpitations.  Gastrointestinal:  Negative for abdominal pain, constipation, diarrhea, nausea and vomiting.  Genitourinary:  Negative for dysuria and frequency.  Musculoskeletal:  Positive for back pain. Negative for arthralgias, joint swelling and neck pain.  Skin:  Negative for rash.  Neurological: Negative.  Negative for tremors and numbness.  Hematological:  Negative for adenopathy. Does not bruise/bleed easily.  Psychiatric/Behavioral:  Negative for behavioral problems (Depression), sleep disturbance and suicidal ideas. The patient is not nervous/anxious.    Vital Signs: BP 118/90    Pulse 73    Temp 98.4 F (36.9 C)    Resp 16    Ht 4\' 10"  (1.473 m)    Wt 187 lb 9.6 oz (85.1 kg)    SpO2 95% Comment: 2 lts on portable and 4 lts at home   BMI 39.21 kg/m    Physical Exam Constitutional:      Appearance: Normal appearance.  HENT:     Head: Normocephalic and atraumatic.     Nose: Nose normal.     Mouth/Throat:     Mouth: Mucous membranes are moist.     Pharynx: No posterior oropharyngeal erythema.  Eyes:     Extraocular Movements: Extraocular movements intact.     Pupils: Pupils are equal, round, and reactive to light.  Cardiovascular:     Pulses: Normal pulses.     Heart sounds: Normal heart sounds.  Pulmonary:     Effort: Tachypnea present.     Breath sounds: Examination of the right-lower field reveals decreased breath sounds. Examination of the left-lower field reveals decreased breath sounds. Decreased breath sounds present.  Neurological:     General:  No focal deficit present.      Mental Status: She is alert.  Psychiatric:        Mood and Affect: Mood normal.        Behavior: Behavior normal.       Assessment/Plan: 1. SOB (shortness of breath) on exertion Patient has a worsening shortness of breath I think this is multifactorial not only acute chronic diastolic congestive heart failure and expiration of COPD patient is on Inogen at the moment I think she needs to have a better device for ambulation patient might also get benefit from noninvasive ventilator support we will look into that in the meantime she does need a CT chest, she also might need repeat catheterization not sure about her pulmonary pressures if echo is a good diagnostic tool will speak with cardiology about that - CT Chest Wo Contrast; Future  2. Depression, major, single episode, moderate ([name]) We will increase her sertraline to 100 mg once a day - sertraline (ZOLOFT) 100 MG tablet; Take one tab a day for Anxiety  Dispense: 90 tablet; Refill: 2  3. OSA (obstructive sleep apnea) We will continue on CPAP for now however patient will get benefit either from BiPAP or NIV  4. Chronic respiratory failure with hypoxia ([name]) Continue therapy until CT chest is available will speak with respiratory as well  5. Paroxysmal atrial fibrillation ([name]) Continue to follow with cardiology - apixaban (ELIQUIS) 5 MG TABS tablet; Take 1 tablet (5 mg total) by mouth 2 (two) times daily.  Dispense: 180 tablet; Refill: 2  6. Chronic midline low back pain without sciatica Continues tramadol for better control of her arthritis back pain and shoulder pain - traMADol (ULTRAM) 50 MG tablet; Take one tab po bid for pain  Dispense: 60 tablet; Refill: 1   General Counseling: Fara verbalizes understanding of the findings of todays visit and agrees with plan of treatment. I have discussed any further diagnostic evaluation that may be needed or ordered today. We also reviewed her medications today. she has been encouraged  to call the office with any questions or concerns that should arise related to todays visit.    Orders Placed This Encounter  Procedures   CT Chest Wo Contrast    Meds ordered this encounter  Medications   apixaban (ELIQUIS) 5 MG TABS tablet    Sig: Take 1 tablet (5 mg total) by mouth 2 (two) times daily.    Dispense:  180 tablet    Refill:  2   traMADol (ULTRAM) 50 MG tablet    Sig: Take one tab po bid for pain    Dispense:  60 tablet    Refill:  1   sertraline (ZOLOFT) 100 MG tablet    Sig: Take one tab a day for Anxiety    Dispense:  90 tablet    Refill:  2    Total time spent:45 Minutes Time spent includes review of chart, medications, test results, and follow up plan with the patient.   [place] Controlled Substance Database was reviewed by me.   Dr [name] Internal medicine

## 2021-11-05 ENCOUNTER — Ambulatory Visit: Payer: Medicare Other | Admitting: Student-PharmD

## 2021-11-05 DIAGNOSIS — J449 Chronic obstructive pulmonary disease, unspecified: Secondary | ICD-10-CM

## 2021-11-05 DIAGNOSIS — I5032 Chronic diastolic (congestive) heart failure: Secondary | ICD-10-CM

## 2021-11-05 NOTE — Progress Notes (Signed)
Follow Up Pharmacist Visit   [name], [name] [id number] 24 years, Female  DOB: November 26, 1945  M: [phone]  Clinical Summary Patient Risk: High Next CCM Follow Up: 03/18/22 Next AWV: 06/29/22 Next PCP Visit: 01/13/22 Summary for PCP: Patient presents via telephone. Reports no improvement in her breathing since last office visit but is using her inhalers and oxygen as prescribed. Patient also checks her BP at home at least once daily and checks weight daily. Endorses adherence to all medication therapy at this time. Reminded patient of appt on 3/7 for PFTs.  Patient Chart Prep  Completed by Charlann Lange on 11/05/2021  Chronic Conditions Patient's Chronic Conditions: Atrial Fibrillation, Hypertension (HTN), Diastolic Heart Failure / HFpEF, Chronic Obstructive Pulmonary Disease (COPD), Osteoarthritis, Anxiety  Doctor and Hospital Visits Were there PCP Visits since last visit with the Pharmacist?: Yes Visit #1: 10/14/21 Jonetta Osgood, NP. For Chronic diastolic heart failure. No medication changes. Visit #2: 10/23/21 Jonetta Osgood, NP. For acute visit. No medication changes. Visit #3: 11/04/21 [name], MD. For Depression, major, single. STARTED Tramadol 50 mg one tab PO BID for pain. CHANGED Sertaline to 100 mg 1 tab a day. Were there Specialist Visits since last visit with the Pharmacist?: Yes Visit #1: 09/09/21 Cardiology Paraschos, Sheppard Coil, MD. For follow-up. No medication changes. Visit #2: 09/17/21 Pain Med Milinda Pointer, MD. For Chronic shoulder pain. No medication changes. Visit #3: 09/25/21 Procedure Visit Pain Med Milinda Pointer, MD. For Chronic shoulder pain. No medication changes. Visit #4: 10/06/21 Cardiology Alisa Graff, FNP For Chronic systolic heart failure. No medication changes. Visit #5: 10/27/21 Cardiology Alisa Graff, FNP For Chronic systolic heart failure. No medication changes. Was there a Hospital Visit in last 30 days?: No Were there other  Hospital Visits since last visit with the Pharmacist?: Yes Visit #1: 10/24/21 [facility] (4 Hours) Arta Silence, MD For shortness of breath. No medication changes. PER NOTE: I have recommended she take an additional Bumex every morning this weekend and to follow-up with CHF clinic on Monday. Visit #2: 10/27/21 [facility] EMERGENCY DEPARTMENT (2 Hours) Merlyn Lot, MD. For shortness of breath. STARTED Prednisone 40 mg daily. PER NOTE: Albuterol 108 (90 Base) MCG/ACT. every 6 hours PRN.  Medication Information Have there been any medication changes from PCP or Specialist since last visit with the Pharmacist?: No Are there any Medication adherence gaps (beyond 5 days past due)?: No Medication adherence rates for the STAR rating drugs: Atorvastatin 40 mg 10/01/21 90 DS Lisinopril 40 mg 09/29/21 90 DS List Patient's current Care Gaps: No current Care Gaps identified  Pre-Call Questions  Completed by Charlann Lange on 11/05/2021 Are you able to connect with Patient: Yes Confirmed appointment date/time with patient/caregiver?: Yes Date/time of the appointment: 11/05/21 at 3:45 PM Visit type: Phone Patient/Caregiver instructed to bring medications to appointment: Yes What, if any, problems do you have getting your medications from the pharmacy?: None What is your top health concern to discuss at your upcoming visit?: Patient stated none. Have you seen any other providers since your last visit?: No  Disease Assessments  Subjective Information Current BP: 118/90 Current HR: 73 taken on: 11/04/2021 Weight: 187 BMI:  39.21  Last GFR: 60 taken on: 10/24/2021 Visit Completed on: 11/05/2021 Why did the patient present?: CCM F/U Visit Is Patient using [facility]?: No Name and location of Current pharmacy: [facility] Current Rx insurance plan: Blue Medicare Are meds synced by current pharmacy?: No Are meds delivered  by current pharmacy?: No - delivery available but patient prefers to not use Would patient benefit from direct intervention of clinical lead in dispensing process to optimize clinical outcomes?: Yes Are [facility] pharmacy services available where patient lives?: Yes Is patient disadvantaged to use [facility]?: Yes Does patient experience delays in picking up medications due to transportation concerns (getting to pharmacy)?: No Any additional demeanor/mood notes?: Patient presents via telephone and was pleasant to speak with.  Chronic Obstructive Pulmonary Disease (COPD) Current FEV1/FVC: 74% Current FEV1: 1.1 taken on: 03/31/2021 Current Eosinophils: 4 taken on: 10/24/2021 Assess this condition today?: Yes Gold grade: 2 (FEV1 50-79%) Gold group: B (high sx, < 2 exacerbations / yr) Exacerbations since last visit with pharmacist?: Yes Was the patient's maintenance therapy adjusted to help prevent future exacerbations?: No Details: PFTs scheduled for 3/7 to further assess Has there been change in patients smoking/vaping habit since last visit with pharmacist?: No Home oxygen therapy: Yes Details: Oxygen 2L all day Frequency of SABA/SAMA use: Multiple times per day We discussed: Inhaler technique Assessment:: Controlled Drug: Trelegy 100 1 puff daily Assessment: Appropriate, Effective, Safe, Accessible Drug: Albuterol HFA 2 puffs every 6 hours as needed Assessment: Appropriate, Effective, Safe, Accessible Additional Info: Patient reports using her albuterol 2-3 times per day right now. Plan to: Continue medication therapy HC Follow up: 1, 2, 3 Pharmacist Follow up: 4 months  Heart Failure with Preserved Ejection Fraction (HFpEF) Last Echo with EF: 170,5 taken on: 10/25/2011 Last BNP: 267.5 taken on: 03/16/2021 Assess this condition today?: Yes Reviewed last eGFR/SCr: Yes Reviewed last Potassium lab: Yes Patient checking daily weights?: Yes What is patient's recent  weights?: Not able to recall Has weight increased more than 3 lbs. in one day or more than 5 lbs. in a week?: No Does Patient use RPM device?: No RPM Scale: Does patient qualify?: No We discussed: Sodium restriction, Daily weight monitoring Assessment:: Controlled Drug: Amlodipine 61m 1 tab daily Assessment: Appropriate, Effective, Safe, Accessible Drug: Bumetanide 150m2 tabs twice daily Assessment: Appropriate, Effective, Safe, Accessible Drug: Carvedilol 3.12573m tab twice daily Assessment: Appropriate, Effective, Safe, Accessible Drug: Lisinopril 46m23mtab daily Assessment: Appropriate, Effective, Safe, Accessible Additional Info: Patient checks her home BP once to twice daily and reports normal readings Plan to (other): Continue medication therapy HC Follow up: 1, 2, 3 months Pharmacist Follow up: 4 months  Exercise, Diet and Non-Drug Coordination Needs Additional exercise counseling points. We discussed: decreasing sedentary behavior Additional diet counseling points. We discussed: key components of the DASH diet Discussed Non-Drug Care Coordination Needs: Yes Does Patient have Medication financial barriers?: No  Accountable [facility] Communities Health-Related Social Needs Screening Tool -  SDOH  What is your living situation today? (ref #1): I have a steady place to live Think about the place you live. Do you have problems with any of the following? (ref #2): None of the above Within the past 12 months, you worried that your food would run out before you got money to buy more (ref #3): Never true Within the past 12 months, the food you bought just didn't last and you didn't have money to get more (ref #4): Never true In the past 12 months, has lack of reliable transportation kept you from medical appointments, meetings, work or from getting things needed for daily living? (ref #5): No In the past 12 months, has the electric, gas, oil, or water company threatened to shut off  services in your home? (ref #6): No How often  does anyone, including family and friends, physically hurt you? (ref #7): Never (1) How often does anyone, including family and friends, insult or talk down to you? (ref #8): Never (1) How often does anyone, including friends and family, threaten you with harm? (ref #9): Never (1) How often does anyone, including family and friends, scream or curse at you? (ref #10): Never (1)  COPD and Physical Activity Chronic obstructive pulmonary disease (COPD) is a long-term, or chronic, condition that affects the lungs. COPD is a general term that can be used to describe many problems that cause inflammation of the lungs and limit airflow. These conditions include chronic bronchitis and emphysema. The main symptom of COPD is shortness of breath, which makes it harder to do even simple tasks. This can also make it harder to exercise and stay active. Talk with your health care provider about treatments to help you breathe better and actions you can take to prevent breathing problems during physical activity. What are the benefits of exercising when you have COPD? Exercising regularly is an important part of a healthy lifestyle. You can still exercise and do physical activities even though you have COPD. Exercise and physical activity improve your shortness of breath by increasing blood flow (circulation). This causes your heart to pump more oxygen through your body. Moderate exercise can: Improve oxygen use. Increase your energy level. Help with shortness of breath. Strengthen your breathing muscles. Improve heart health. Help with sleep. Improve your self-esteem and feelings of self-worth. Lower depression, stress, and anxiety. Exercise can benefit everyone with COPD. The severity of your disease may affect how hard you can exercise, especially at first, but everyone can benefit. Talk with your health care provider about how much exercise is safe for you, and  which activities and exercises are safe for you. What actions can I take to prevent breathing problems during physical activity? Sign up for a pulmonary rehabilitation program. This type of program may include: Education about lung diseases. Exercise classes that teach you how to exercise and be more active while improving your breathing. This usually involves: Exercise using your lower extremities, such as a stationary bicycle. About 30 minutes of exercise, 2 to 5 times per week, for 6 to 12 weeks. Strength training, such as push-ups or leg lifts. Nutrition education. Group classes in which you can talk with others who also have COPD and learn ways to manage stress. If you use an oxygen tank, you should use it while you exercise. Work with your health care provider to adjust your oxygen for your physical activity. Your resting flow rate is different from your flow rate during physical activity. How to manage your breathing while exercising While you are exercising: Take slow breaths. Pace yourself, and do not try to go too fast. Purse your lips while breathing out. Pursing your lips is similar to a kissing or whistling position. If doing exercise that uses a quick burst of effort, such as weight lifting: Breathe in before starting the exercise. Breathe out during the hardest part of the exercise, such as raising the weights. Where to find support You can find support for exercising with COPD from: Your health care provider. A pulmonary rehabilitation program. Your local health department or community health programs. Support groups, either online or in-person. Your health care provider may be able to recommend support groups. Where to find more information You can find more information about exercising with COPD from: American Lung Association: lung.org COPD Foundation: copdfoundation.org Contact a health  care provider if: Your symptoms get worse. You have nausea. You have a  fever. You want to start a new exercise program or a new activity. Get help right away if: You have chest pain. You cannot breathe. These symptoms may represent a serious problem that is an emergency. Do not wait to see if the symptoms will go away. Get medical help right away. Call your local emergency services (911 in the [place] Do not drive yourself to the hospital. Summary COPD is a general term that can be used to describe many different lung problems that cause lung inflammation and limit airflow. This includes chronic bronchitis and emphysema. Exercise and physical activity improve your shortness of breath by increasing blood flow (circulation). This causes your heart to provide more oxygen to your body. Contact your health care provider before starting any exercise program or new activity. Ask your health care provider what exercises and activities are safe for you. This information is not intended to replace advice given to you by your health care provider. Make sure you discuss any questions you have with your health care provider.      Engagement Notes HC Chart Review: 22 min 11/05/21 HC Assessment call time spent: 8 min 11/05/21 CP Office Visit: 30 min 11/05/21 CP Office Visit Documentation: 20 min 11/05/21  Alena Bills Clinical Pharmacist [phone]

## 2021-11-07 ENCOUNTER — Telehealth: Payer: Self-pay | Admitting: Student-PharmD

## 2021-11-07 NOTE — Progress Notes (Signed)
?  Chronic Care Management ?Pharmacy Assistant  ? ?Name: [name]  MRN: [id number] DOB: 1945/11/09 ? ? ?Reason for Encounter: Care Plan and Patient Handout ? ?Medications: ?Outpatient Encounter Medications as of 11/07/2021  ?Medication Sig Note  ? albuterol (VENTOLIN HFA) 108 (90 Base) MCG/ACT inhaler Inhale 2 puffs into the lungs every 6 (six) hours as needed for wheezing or shortness of breath.   ? ALPRAZolam (XANAX) 0.25 MG tablet Take 1 tablet (0.25 mg total) by mouth 2 (two) times daily as needed for anxiety.   ? amLODipine (NORVASC) 10 MG tablet Take 1 tablet by mouth daily.   ? amoxicillin (AMOXIL) 500 MG capsule Take 4 cap po 1 hrs before dental procedure   ? apixaban (ELIQUIS) 5 MG TABS tablet Take 1 tablet (5 mg total) by mouth 2 (two) times daily.   ? atorvastatin (LIPITOR) 40 MG tablet 1 tab po qhs   ? bumetanide (BUMEX) 1 MG tablet TAKE 2 TABLETS BY MOUTH TWICE DAILY   ? Calcium Carbonate-Vitamin D (CALCIUM 600+D PO) Take 1 tablet by mouth daily.   ? carvedilol (COREG) 3.125 MG tablet Take 3.125 mg by mouth 2 (two) times daily with a meal. 08/04/2021: Patient taking differently per cardiology, 2 tabs twice daily  ? Coenzyme Q10 (COQ10) 200 MG CAPS Take 200 mg by mouth daily.   ? diclofenac Sodium (VOLTAREN) 1 % GEL SMARTSIG:Gram(s) Topical Twice Daily   ? dicyclomine (BENTYL) 10 MG capsule Take 1 capsule (10 mg total) by mouth 3 (three) times daily as needed for spasms.   ? ferrous sulfate 325 (65 FE) MG tablet Take 2 tablets (650 mg total) by mouth 2 (two) times daily with a meal. (Patient taking differently: Take 325 mg by mouth. Takes 4 tablets by mouth twice a day)   ? flecainide (TAMBOCOR) 50 MG tablet Take 1 tablet (50 mg total) by mouth 2 (two) times daily. PLEASE CONTACT OFFICE [phone] TO SCHEDULE APPOINTMENT FOR FUTURE REFILLS   ? Fluticasone-Umeclidin-Vilant (TRELEGY ELLIPTA) 100-62.5-25 MCG/ACT AEPB Inhale 1 puff into the lungs daily.   ? levothyroxine (SYNTHROID) 25 MCG tablet Take 1  tablet (25 mcg total) by mouth daily before breakfast.   ? lisinopril (ZESTRIL) 40 MG tablet TAKE 1 TABLET(40 MG) BY MOUTH DAILY(DOSE INCREASE)   ? loratadine (CLARITIN) 10 MG tablet Take 10 mg by mouth daily.   ? oxyCODONE-acetaminophen (PERCOCET) 7.5-325 MG tablet Take one tab in am and half in pm for severe shoulder pain   ? pantoprazole (PROTONIX) 40 MG tablet Take 1 tablet (40 mg total) by mouth daily.   ? potassium chloride (KLOR-CON) 10 MEQ tablet Take 4 tablets (40 mEq total) by mouth 2 (two) times daily.   ? sertraline (ZOLOFT) 100 MG tablet Take one tab a day for Anxiety   ? traMADol (ULTRAM) 50 MG tablet Take one tab po bid for pain   ? traZODone (DESYREL) 50 MG tablet Take 1 tablet (50 mg total) by mouth at bedtime.   ? ?No facility-administered encounter medications on file as of 11/07/2021.  ? ? ?Reviewed the patients visit reinsured it was completed per the pharmacist Alena Bills request. Printed the CCM care plan. Mailed the patient CCM care plan and patient handout to their most recent address on file. ? ?Charlann Lange, RMA ?Healthcare Concierge  ?[phone] ? ?

## 2021-11-10 ENCOUNTER — Encounter: Payer: Self-pay | Admitting: Emergency Medicine

## 2021-11-10 ENCOUNTER — Emergency Department: Payer: Medicare Other

## 2021-11-10 ENCOUNTER — Inpatient Hospital Stay
Admission: EM | Admit: 2021-11-10 | Discharge: 2021-11-14 | DRG: 291 | Disposition: A | Discharge status: Home Health | Payer: Medicare Other | Attending: Internal Medicine | Admitting: Internal Medicine

## 2021-11-10 DIAGNOSIS — Z881 Allergy status to other antibiotic agents status: Secondary | ICD-10-CM

## 2021-11-10 DIAGNOSIS — Z79890 Hormone replacement therapy (postmenopausal): Secondary | ICD-10-CM

## 2021-11-10 DIAGNOSIS — J690 Pneumonitis due to inhalation of food and vomit: Secondary | ICD-10-CM | POA: Diagnosis present

## 2021-11-10 DIAGNOSIS — I38 Endocarditis, valve unspecified: Secondary | ICD-10-CM | POA: Diagnosis present

## 2021-11-10 DIAGNOSIS — Z950 Presence of cardiac pacemaker: Secondary | ICD-10-CM | POA: Diagnosis not present

## 2021-11-10 DIAGNOSIS — H9193 Unspecified hearing loss, bilateral: Secondary | ICD-10-CM | POA: Diagnosis not present

## 2021-11-10 DIAGNOSIS — Z882 Allergy status to sulfonamides status: Secondary | ICD-10-CM

## 2021-11-10 DIAGNOSIS — J9621 Acute and chronic respiratory failure with hypoxia: Secondary | ICD-10-CM | POA: Diagnosis not present

## 2021-11-10 DIAGNOSIS — Z8673 Personal history of transient ischemic attack (TIA), and cerebral infarction without residual deficits: Secondary | ICD-10-CM | POA: Diagnosis not present

## 2021-11-10 DIAGNOSIS — I4819 Other persistent atrial fibrillation: Secondary | ICD-10-CM | POA: Diagnosis not present

## 2021-11-10 DIAGNOSIS — I110 Hypertensive heart disease with heart failure: Principal | ICD-10-CM | POA: Diagnosis present

## 2021-11-10 DIAGNOSIS — R5381 Other malaise: Secondary | ICD-10-CM | POA: Diagnosis not present

## 2021-11-10 DIAGNOSIS — I5033 Acute on chronic diastolic (congestive) heart failure: Secondary | ICD-10-CM | POA: Diagnosis not present

## 2021-11-10 DIAGNOSIS — J181 Lobar pneumonia, unspecified organism: Secondary | ICD-10-CM | POA: Diagnosis not present

## 2021-11-10 DIAGNOSIS — E785 Hyperlipidemia, unspecified: Secondary | ICD-10-CM | POA: Diagnosis present

## 2021-11-10 DIAGNOSIS — Z79899 Other long term (current) drug therapy: Secondary | ICD-10-CM

## 2021-11-10 DIAGNOSIS — Z886 Allergy status to analgesic agent status: Secondary | ICD-10-CM

## 2021-11-10 DIAGNOSIS — Z888 Allergy status to other drugs, medicaments and biological substances status: Secondary | ICD-10-CM

## 2021-11-10 DIAGNOSIS — I509 Heart failure, unspecified: Secondary | ICD-10-CM

## 2021-11-10 DIAGNOSIS — I480 Paroxysmal atrial fibrillation: Secondary | ICD-10-CM | POA: Diagnosis not present

## 2021-11-10 DIAGNOSIS — R0902 Hypoxemia: Secondary | ICD-10-CM

## 2021-11-10 DIAGNOSIS — I4892 Unspecified atrial flutter: Secondary | ICD-10-CM | POA: Diagnosis not present

## 2021-11-10 DIAGNOSIS — D649 Anemia, unspecified: Secondary | ICD-10-CM | POA: Diagnosis present

## 2021-11-10 DIAGNOSIS — Z87891 Personal history of nicotine dependence: Secondary | ICD-10-CM

## 2021-11-10 DIAGNOSIS — Z953 Presence of xenogenic heart valve: Secondary | ICD-10-CM | POA: Diagnosis not present

## 2021-11-10 DIAGNOSIS — E871 Hypo-osmolality and hyponatremia: Secondary | ICD-10-CM | POA: Diagnosis present

## 2021-11-10 DIAGNOSIS — Z7951 Long term (current) use of inhaled steroids: Secondary | ICD-10-CM

## 2021-11-10 DIAGNOSIS — Z7901 Long term (current) use of anticoagulants: Secondary | ICD-10-CM

## 2021-11-10 DIAGNOSIS — E039 Hypothyroidism, unspecified: Secondary | ICD-10-CM | POA: Diagnosis present

## 2021-11-10 DIAGNOSIS — Z20822 Contact with and (suspected) exposure to covid-19: Secondary | ICD-10-CM | POA: Diagnosis present

## 2021-11-10 DIAGNOSIS — Z823 Family history of stroke: Secondary | ICD-10-CM

## 2021-11-10 DIAGNOSIS — I10 Essential (primary) hypertension: Secondary | ICD-10-CM | POA: Diagnosis present

## 2021-11-10 DIAGNOSIS — J432 Centrilobular emphysema: Secondary | ICD-10-CM | POA: Diagnosis present

## 2021-11-10 DIAGNOSIS — Z9981 Dependence on supplemental oxygen: Secondary | ICD-10-CM

## 2021-11-10 DIAGNOSIS — R0602 Shortness of breath: Secondary | ICD-10-CM

## 2021-11-10 DIAGNOSIS — Z6839 Body mass index (BMI) 39.0-39.9, adult: Secondary | ICD-10-CM

## 2021-11-10 NOTE — ED Triage Notes (Signed)
Pt arrived via [facility] from home where pt called due to increased SOB while ambulation. Baseline is 4L oxygen. Pt placed on 6L due to sats dropping to 82-83% while ambulating approx 10 ft. Pt reports she has seen PCP for same and had lasix adjusted up and then back down. Fine crackles in lower lobes.  ?Pt at 96% on 4L in triage.  ?

## 2021-11-10 NOTE — ED Notes (Signed)
Pt denied laying on the bed. Pt preferred to remain in wheelchair ?

## 2021-11-10 NOTE — ED Provider Notes (Signed)
[facility] Provider Note    Event Date/Time   First MD Initiated Contact with Patient 11/10/21 2340     (approximate)   History   Shortness of Breath   HPI  [name] is a 75 y.o. female brought to the ED via EMS from home with a chief complaint of respiratory distress.  Patient with a history of CHF, COPD on 4L nasal cannula oxygen at baseline, aortic stenosis status post valve replacement, atrial fibrillation on Eliquis who reports shortness of breath while ambulating.  Sats on 4 L nasal cannula oxygen dropped to 82% while ambulating approximately 10 feet.  EMS placed patient on 6 L nasal cannula oxygen.  Patient reports she has seen PCP for same and has had her Lasix dosages adjusted.  Endorses associated chest tightness.  Denies fever, cough, abdominal pain, nausea, vomiting or dizziness.     Past Medical History   Past Medical History:  Diagnosis Date   Aortic 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 s/p bioprosthetic valve replacement 2008 at [facility];  b. 01/2015 Echo: EF 60-65%, no rwma, Gr1 DD, mildly dil LA, nl RV fxn.   Atrial fibrillation with RVR ([place]) 01/11/2015   Atrial flutter ([place])    a. 08/2016 s/p DCCV.  Remains on flecainide 50 mg bid.   Basal skull fracture ([name]) 20 yrs ago   CHF (congestive heart failure) ([name])    Chronic respiratory failure ([name])    COPD (chronic obstructive pulmonary disease) ([place])    a. 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 2008 prior to Aortic aneurysm repair-->nl cors.   History of stress test    a. 10/2015 MV: no ischemia/infarct.   HLD (hyperlipidemia)    HTN (hypertension)    Hypothyroidism    Obesity    PAF (paroxysmal atrial fibrillation) ([name])    a. on Eliquis; b. CHADS2VASc = 3 (HTN, age x 59, female).   Paroxysmal atrial fibrillation ([place]) 01/24/2015   Right upper quadrant  abdominal tenderness without rebound tenderness 03/02/2018   S/P ascending aortic aneurysm repair 2008     Active Problem List   Patient Active Problem List   Diagnosis Date Noted   CHF exacerbation ([place]) 11/11/2021   Acute on chronic respiratory failure with hypoxia ([place]) 11/11/2021   Unable to maintain body in lying position 10/16/2021   Orthopnea 10/16/2021   Class 2 obesity with alveolar hypoventilation, serious comorbidity, and body mass index (BMI) of 39.0 to 39.9 in adult ([place]) 10/16/2021   At high risk for postoperative complications 86/57/8469   Hearing loss 08/12/2021   Long term prescription benzodiazepine use 04/30/2021   Chronic shoulder pain (1ry area of Pain) (Bilateral) (L>R) 04/30/2021   Chronic upper extremity pain (2ry area of Pain) (Bilateral) (L>R) 04/30/2021   Cervicalgia 04/30/2021   Chronic neck pain (3ry area of Pain) (Posterior) (Bilateral) (L>R) 04/30/2021   Shoulder blade pain (4th area of Pain) (Left) 04/30/2021   Osteoarthritis of glenohumeral joint (Left) 04/30/2021   Osteoarthritis of AC (acromioclavicular) joint (Left) 04/30/2021   Chronic headaches (5th area of Pain) 04/30/2021   Osteoarthritis of glenohumeral joints (Bilateral) 04/30/2021   Osteoarthritis of acromioclavicular joints (Bilateral) 04/30/2021   Primary osteoarthritis of shoulders (Bilateral) 04/30/2021   DDD (degenerative disc disease), cervical 04/30/2021   Cervical radiculitis (  Left) 04/30/2021   Cervical radiculopathy (Left) 04/30/2021   C6 radiculopathy (Left) 04/30/2021   C7 radiculopathy (Left) 04/30/2021   Wheelchair dependence 04/30/2021   Chronic pain syndrome 04/29/2021   Pharmacologic therapy 04/29/2021   Disorder of skeletal system 04/29/2021   Problems influencing health status 04/29/2021   Chronic anticoagulation (Eliquis) 03/14/2021   Hx of atrioventricular node ablation 03/14/2021   Symptomatic anemia 01/29/2021   Acute on chronic diastolic CHF (congestive heart  failure) ([place]) 05/11/2020   COPD (chronic obstructive pulmonary disease) ([place])    Hypothyroidism    Cardiac pacemaker in situ 05/10/2020   Acute non-recurrent frontal sinusitis 04/22/2020   Vertigo 04/22/2020   S/P placement of cardiac pacemaker 02/21/2020   Atrial fibrillation status post cardioversion ([place]) 11/16/2019   Episode of moderate major depression ([place]) 10/10/2019   Persistent atrial fibrillation ([place])    Encounter for general adult medical examination with abnormal findings 07/10/2019   Encounter for screening mammogram for malignant neoplasm of breast 07/10/2019   Atopic dermatitis 05/02/2019   Paroxysmal atrial flutter ([place]) 08/11/2018   Encounter for long-term (current) use of medications 07/09/2018   Chronic left shoulder pain 07/09/2018   Conjunctivitis 07/09/2018   Oxygen dependent 07/09/2018   GAD (generalized anxiety disorder) 07/09/2018   Ovarian failure 07/09/2018   Positive colorectal cancer screening using Cologuard test 04/24/2018   Calculus of gallbladder with acute on chronic cholecystitis 04/08/2018   H/O: CVA (cerebrovascular accident) 04/08/2018   Dysuria 03/02/2018   Urinary tract infection without hematuria 03/02/2018   Right upper quadrant abdominal tenderness without rebound tenderness 03/02/2018   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 exacerbation ([place]) 03/02/2018   Chronic obstructive pulmonary disease ([place]) 09/19/2017   Typical atrial flutter ([name])    Irritable bowel syndrome without diarrhea 01/29/2015   Essential hypertension 01/24/2015   Paroxysmal atrial fibrillation ([place]) 01/24/2015   History of aortic valve replacement with bioprosthetic valve 01/24/2015   Atrial fibrillation with RVR ([place]) 01/11/2015   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 05/18/2014   Shoulder impingement syndrome (Left) 05/18/2014   Cellulitis and abscess 02/13/2014   MRSA (methicillin resistant Staphylococcus aureus) 02/13/2014   Aneurysm,  ascending aorta 06/23/2013   Bicuspid aortic valve 06/23/2013     Past Surgical History   Past Surgical History:  Procedure Laterality Date   ABDOMINAL AORTIC ANEURYSM REPAIR  2008   ABDOMINAL HYSTERECTOMY     AORTIC VALVE REPLACEMENT  2008   CARDIAC CATHETERIZATION     [place]   CARDIOVERSION N/A 08/15/2018   Procedure: CARDIOVERSION;  Surgeon: [name], MD;  Location: [facility] ORS;  Service: Cardiovascular;  Laterality: N/A;   CARDIOVERSION N/A 11/14/2018   Procedure: CARDIOVERSION (CATH LAB);  Surgeon: [name], MD;  Location: [facility] ORS;  Service: Cardiovascular;  Laterality: N/A;   CARDIOVERSION N/A 06/05/2019   Procedure: CARDIOVERSION;  Surgeon: [name], MD;  Location: [place] ORS;  Service: Cardiovascular;  Laterality: N/A;   CARDIOVERSION N/A 08/14/2019   Procedure: CARDIOVERSION;  Surgeon: [name], MD;  Location: [place] [facility]S;  Service: Cardiovascular;  Laterality: N/A;   CARDIOVERSION N/A 11/09/2019   Procedure: CARDIOVERSION;  Surgeon: Isaias Cowman, MD;  Location: [facility] ORS;  Service: Cardiovascular;  Laterality: N/A;   CARDIOVERSION N/A 01/17/2020   Procedure: CARDIOVERSION;  Surgeon: Isaias Cowman, MD;  Location: [facility] ORS;  Service: Cardiovascular;  Laterality: N/A;   CARPAL TUNNEL RELEASE     ELECTROPHYSIOLOGIC STUDY N/A 08/17/2016   Procedure: Cardioversion;  Surgeon: [name], MD;  Location: [facility]  ORS;  Service: Cardiovascular;  Laterality: N/A;   TUMOR EXCISION Left    x3 (arm)     Home Medications   Prior to Admission medications   Medication Sig Start Date End Date Taking? Authorizing Provider  albuterol (VENTOLIN HFA) 108 (90 Base) MCG/ACT inhaler Inhale 2 puffs into the lungs every 6 (six) hours as needed for wheezing or shortness of breath. 10/27/21   Merlyn Lot, MD  ALPRAZolam Duanne Moron) 0.25 MG tablet Take 1 tablet (0.25 mg total) by mouth 2 (two) times daily as needed for anxiety. 04/21/21   [name], MD   amLODipine (NORVASC) 10 MG tablet Take 1 tablet by mouth daily. 07/08/21 07/08/22  [provider]  amoxicillin (AMOXIL) 500 MG capsule Take 4 cap po 1 hrs before dental procedure 10/13/21   Jonetta Osgood, NP  apixaban (ELIQUIS) 5 MG TABS tablet Take 1 tablet (5 mg total) by mouth 2 (two) times daily. 11/04/21   [name], MD  atorvastatin (LIPITOR) 40 MG tablet 1 tab po qhs 10/01/21   [name], MD  bumetanide (BUMEX) 1 MG tablet TAKE 2 TABLETS BY MOUTH TWICE DAILY 08/31/21   Darylene Price A, FNP  Calcium Carbonate-Vitamin D (CALCIUM 600+D PO) Take 1 tablet by mouth daily.    [provider]  carvedilol (COREG) 3.125 MG tablet Take 3.125 mg by mouth 2 (two) times daily with a meal.    [provider]  Coenzyme Q10 (COQ10) 200 MG CAPS Take 200 mg by mouth daily.    [provider]  diclofenac Sodium (VOLTAREN) 1 % GEL SMARTSIG:Gram(s) Topical Twice Daily 09/09/20   [provider]  dicyclomine (BENTYL) 10 MG capsule Take 1 capsule (10 mg total) by mouth 3 (three) times daily as needed for spasms. 07/22/21   Lucilla Lame, MD  ferrous sulfate 325 (65 FE) MG tablet Take 2 tablets (650 mg total) by mouth 2 (two) times daily with a meal. Patient taking differently: Take 325 mg by mouth. Takes 4 tablets by mouth twice a day 01/30/21   Jennye Boroughs, MD  flecainide (TAMBOCOR) 50 MG tablet Take 1 tablet (50 mg total) by mouth 2 (two) times daily. PLEASE CONTACT OFFICE [phone] TO SCHEDULE APPOINTMENT FOR FUTURE REFILLS 02/23/20   Deboraha Sprang, MD  Fluticasone-Umeclidin-Vilant (TRELEGY ELLIPTA) 100-62.5-25 MCG/ACT AEPB Inhale 1 puff into the lungs daily. 10/14/21   Jonetta Osgood, NP  levothyroxine (SYNTHROID) 25 MCG tablet Take 1 tablet (25 mcg total) by mouth daily before breakfast. 09/29/21   [name], MD  lisinopril (ZESTRIL) 40 MG tablet TAKE 1 TABLET(40 MG) BY MOUTH DAILY(DOSE INCREASE) 09/29/21   Alisa Graff, FNP  loratadine (CLARITIN) 10  MG tablet Take 10 mg by mouth daily.    [provider]  oxyCODONE-acetaminophen (PERCOCET) 7.5-325 MG tablet Take one tab in am and half in pm for severe shoulder pain 10/14/21   Jonetta Osgood, NP  pantoprazole (PROTONIX) 40 MG tablet Take 1 tablet (40 mg total) by mouth daily. 10/01/21   [name], MD  potassium chloride (KLOR-CON) 10 MEQ tablet Take 4 tablets (40 mEq total) by mouth 2 (two) times daily. 10/06/21   Jonetta Osgood, NP  sertraline (ZOLOFT) 100 MG tablet Take one tab a day for Anxiety 11/04/21   [name], MD  traMADol Veatrice Bourbon) 50 MG tablet Take one tab po bid for pain 11/04/21   [name], MD  traZODone (DESYREL) 50 MG tablet Take 1 tablet (50 mg total)  by mouth at bedtime. 10/01/21   [name], MD     Allergies  Benadryl [diphenhydramine hcl (sleep)], Cetirizine, Lasix [furosemide], Levaquin [levofloxacin in d5w], Meloxicam, Soy allergy, and Sulfa antibiotics   Family History   Family History  Problem Relation Age of Onset   Stroke Father    Stroke Paternal Grandmother      Physical Exam  Triage Vital Signs: ED Triage Vitals [11/10/21 2325]  Enc Vitals Group     BP 131/60     Pulse Rate 72     Resp (!) 21     Temp 99.7 F (37.6 C)     Temp Source Oral     SpO2 96 %     Weight      Height      Head Circumference      Peak Flow      Pain Score      Pain Loc      Pain Edu?      Excl. in [place]?     Updated Vital Signs: BP (!) 109/59    Pulse 80    Temp 99.7 F (37.6 C) (Oral)    Resp 18    SpO2 92%    General: Awake, mild distress.  Reads lips. CV:  RRR.  Good peripheral perfusion.  Resp:  Increased effort.  Bibasilar rales. Abd:  Nontender.  No distention.  Other:  1+ BLE nonpitting pedal edema.   ED Results / Procedures / Treatments  Labs (all labs ordered are listed, but only abnormal results are displayed) Labs Reviewed  BASIC METABOLIC PANEL - Abnormal; Notable for the following components:      Result Value    Sodium 134 (*)    Glucose, Bld 122 (*)    All other components within normal limits  CBC WITH DIFFERENTIAL/PLATELET - Abnormal; Notable for the following components:   RBC 3.61 (*)    Hemoglobin 10.5 (*)    HCT 32.8 (*)    All other components within normal limits  BRAIN NATRIURETIC PEPTIDE - Abnormal; Notable for the following components:   B Natriuretic Peptide 174.7 (*)    All other components within normal limits  RESP PANEL BY RT-PCR (FLU A&B, COVID) ARPGX2  TROPONIN I (HIGH SENSITIVITY)  TROPONIN I (HIGH SENSITIVITY)     EKG  ED ECG REPORT I, Aditya Nastasi J, the attending physician, personally viewed and interpreted this ECG.   Date: 11/10/2021  EKG Time: 2331  Rate: 72  Rhythm: paced  Axis: paced  Intervals: paced  ST&T Change: paced    RADIOLOGY I have independently visualized and reviewed patient's chest x-ray as well as the radiology interpretation:  Chest x-ray: Chronic interstitial opacities  Official radiology report(s): DG Chest 2 View  Result Date: 11/11/2021 CLINICAL DATA:  Shortness of breath EXAM: CHEST - 2 VIEW COMPARISON:  10/27/2021, 03/17/2021 FINDINGS: Post sternotomy changes. Left-sided pacing device as before. Mild cardiomegaly. Mild diffuse reticular interstitial opacity likely due to chronic change. Subsegmental atelectasis or scar at the bases. No pleural effusion or confluent airspace disease. Aortic atherosclerosis. IMPRESSION: No active cardiopulmonary disease. Mild cardiomegaly. Chronic interstitial opacities. Electronically Signed   By: Donavan Foil M.D.   On: 11/11/2021 00:18     PROCEDURES:  Critical Care performed: No  .1-3 Lead EKG Interpretation Performed by: Paulette Blanch, MD Authorized by: Paulette Blanch, MD     Interpretation: normal     ECG rate:  72   ECG rate assessment: normal  Rhythm: paced     Ectopy: none     Conduction: normal   Comments:     Patient placed on cardiac monitor to evaluate for  arrhythmias   MEDICATIONS ORDERED IN ED: Medications  enoxaparin (LOVENOX) injection 42.5 mg (has no administration in time range)  acetaminophen (TYLENOL) tablet 650 mg (has no administration in time range)    Or  acetaminophen (TYLENOL) suppository 650 mg (has no administration in time range)  ondansetron (ZOFRAN) tablet 4 mg (has no administration in time range)    Or  ondansetron (ZOFRAN) injection 4 mg (has no administration in time range)  bumetanide (BUMEX) injection 1 mg (has no administration in time range)  ipratropium-albuterol (DUONEB) 0.5-2.5 (3) MG/3ML nebulizer solution 3 mL (has no administration in time range)  bumetanide (BUMEX) injection 1 mg (1 mg Intravenous Given 11/11/21 0132)     IMPRESSION / MDM / ASSESSMENT AND PLAN / ED COURSE  I reviewed the triage vital signs and the nursing notes.                             76 year old female with CHF on 4 L nasal cannula oxygen at baseline who presents with dyspnea on exertion. Differential includes, but is not limited to, viral syndrome, bronchitis including COPD exacerbation, pneumonia, reactive airway disease including asthma, CHF including exacerbation with or without pulmonary/interstitial edema, pneumothorax, ACS, thoracic trauma, and pulmonary embolism.  I have personally reviewed patient's records and see she had a telephone visit on 11/05/2021 where she was noted to have no improvement in her breathing since last office visit using her inhalers and oxygen.  The patient is on the cardiac monitor to evaluate for evidence of arrhythmia and/or significant heart rate changes.  Will obtain cardiac panel, check BNP, chest x-ray.  Suspect patient will need dose of IV Lasix.  Anticipate hospitalization.  0130 Laboratory results demonstrate normal WBC 7.6, very mild hyponatremia sodium 134, mildly elevated BNP 174, negative troponin, negative respiratory panel.  Chest x-ray demonstrates chronic interstitial opacities.  Patient  was given 1 mg IV Bumex.  Hospitalist services were consulted for evaluation and admission   FINAL CLINICAL IMPRESSION(S) / ED DIAGNOSES   Final diagnoses:  Shortness of breath  Hypoxia  Acute on chronic congestive heart failure, unspecified heart failure type ([place])     Rx / DC Orders   ED Discharge Orders     None        Note:  This document was prepared using Dragon voice recognition software and may include unintentional dictation errors.   Paulette Blanch, MD 11/11/21 [phone]

## 2021-11-11 ENCOUNTER — Inpatient Hospital Stay
Admit: 2021-11-11 | Discharge: 2021-11-11 | Disposition: A | Discharge status: Home / Self Care | Payer: Medicare Other | Attending: Internal Medicine | Admitting: Internal Medicine

## 2021-11-11 ENCOUNTER — Telehealth: Payer: Self-pay

## 2021-11-11 ENCOUNTER — Encounter: Payer: Self-pay | Admitting: Internal Medicine

## 2021-11-11 ENCOUNTER — Ambulatory Visit: Payer: Medicare Other | Admitting: Internal Medicine

## 2021-11-11 DIAGNOSIS — E039 Hypothyroidism, unspecified: Secondary | ICD-10-CM | POA: Diagnosis present

## 2021-11-11 DIAGNOSIS — E785 Hyperlipidemia, unspecified: Secondary | ICD-10-CM | POA: Diagnosis present

## 2021-11-11 DIAGNOSIS — J9621 Acute and chronic respiratory failure with hypoxia: Secondary | ICD-10-CM

## 2021-11-11 DIAGNOSIS — I480 Paroxysmal atrial fibrillation: Secondary | ICD-10-CM | POA: Diagnosis present

## 2021-11-11 DIAGNOSIS — Z7901 Long term (current) use of anticoagulants: Secondary | ICD-10-CM | POA: Diagnosis not present

## 2021-11-11 DIAGNOSIS — I110 Hypertensive heart disease with heart failure: Secondary | ICD-10-CM | POA: Diagnosis present

## 2021-11-11 DIAGNOSIS — Z953 Presence of xenogenic heart valve: Secondary | ICD-10-CM | POA: Diagnosis not present

## 2021-11-11 DIAGNOSIS — Z886 Allergy status to analgesic agent status: Secondary | ICD-10-CM | POA: Diagnosis not present

## 2021-11-11 DIAGNOSIS — Z950 Presence of cardiac pacemaker: Secondary | ICD-10-CM | POA: Diagnosis not present

## 2021-11-11 DIAGNOSIS — Z881 Allergy status to other antibiotic agents status: Secondary | ICD-10-CM | POA: Diagnosis not present

## 2021-11-11 DIAGNOSIS — I5033 Acute on chronic diastolic (congestive) heart failure: Secondary | ICD-10-CM

## 2021-11-11 DIAGNOSIS — Z79890 Hormone replacement therapy (postmenopausal): Secondary | ICD-10-CM | POA: Diagnosis not present

## 2021-11-11 DIAGNOSIS — Z8673 Personal history of transient ischemic attack (TIA), and cerebral infarction without residual deficits: Secondary | ICD-10-CM | POA: Diagnosis not present

## 2021-11-11 DIAGNOSIS — E871 Hypo-osmolality and hyponatremia: Secondary | ICD-10-CM | POA: Diagnosis present

## 2021-11-11 DIAGNOSIS — I4892 Unspecified atrial flutter: Secondary | ICD-10-CM | POA: Diagnosis present

## 2021-11-11 DIAGNOSIS — D649 Anemia, unspecified: Secondary | ICD-10-CM | POA: Diagnosis present

## 2021-11-11 DIAGNOSIS — J432 Centrilobular emphysema: Secondary | ICD-10-CM | POA: Diagnosis present

## 2021-11-11 DIAGNOSIS — I509 Heart failure, unspecified: Secondary | ICD-10-CM | POA: Insufficient documentation

## 2021-11-11 DIAGNOSIS — Z20822 Contact with and (suspected) exposure to covid-19: Secondary | ICD-10-CM | POA: Diagnosis present

## 2021-11-11 DIAGNOSIS — H9193 Unspecified hearing loss, bilateral: Secondary | ICD-10-CM | POA: Diagnosis present

## 2021-11-11 DIAGNOSIS — Z7951 Long term (current) use of inhaled steroids: Secondary | ICD-10-CM | POA: Diagnosis not present

## 2021-11-11 DIAGNOSIS — Z79899 Other long term (current) drug therapy: Secondary | ICD-10-CM | POA: Diagnosis not present

## 2021-11-11 DIAGNOSIS — J690 Pneumonitis due to inhalation of food and vomit: Secondary | ICD-10-CM | POA: Diagnosis present

## 2021-11-11 DIAGNOSIS — I4819 Other persistent atrial fibrillation: Secondary | ICD-10-CM | POA: Diagnosis present

## 2021-11-11 DIAGNOSIS — Z823 Family history of stroke: Secondary | ICD-10-CM | POA: Diagnosis not present

## 2021-11-11 DIAGNOSIS — R0602 Shortness of breath: Secondary | ICD-10-CM | POA: Diagnosis present

## 2021-11-11 LAB — CBC WITH DIFFERENTIAL/PLATELET
Abs Immature Granulocytes: 0.03 10*3/uL (ref 0.00–0.07)
Basophils Absolute: 0 10*3/uL (ref 0.0–0.1)
Basophils Relative: 1 %
Eosinophils Absolute: 0.3 10*3/uL (ref 0.0–0.5)
Eosinophils Relative: 3 %
HCT: 32.8 % — ABNORMAL LOW (ref 36.0–46.0)
Hemoglobin: 10.5 g/dL — ABNORMAL LOW (ref 12.0–15.0)
Immature Granulocytes: 0 %
Lymphocytes Relative: 15 %
Lymphs Abs: 1.1 10*3/uL (ref 0.7–4.0)
MCH: 29.1 pg (ref 26.0–34.0)
MCHC: 32 g/dL (ref 30.0–36.0)
MCV: 90.9 fL (ref 80.0–100.0)
Monocytes Absolute: 0.9 10*3/uL (ref 0.1–1.0)
Monocytes Relative: 12 %
Neutro Abs: 5.3 10*3/uL (ref 1.7–7.7)
Neutrophils Relative %: 69 %
Platelets: 244 10*3/uL (ref 150–400)
RBC: 3.61 MIL/uL — ABNORMAL LOW (ref 3.87–5.11)
RDW: 14.4 % (ref 11.5–15.5)
WBC: 7.6 10*3/uL (ref 4.0–10.5)
nRBC: 0 % (ref 0.0–0.2)

## 2021-11-11 LAB — BASIC METABOLIC PANEL
Anion gap: 9 (ref 5–15)
BUN: 12 mg/dL (ref 8–23)
CO2: 26 mmol/L (ref 22–32)
Calcium: 9 mg/dL (ref 8.9–10.3)
Chloride: 99 mmol/L (ref 98–111)
Creatinine, Ser: 0.9 mg/dL (ref 0.44–1.00)
GFR, Estimated: >60 mL/min (ref >60)
Glucose, Bld: 122 mg/dL — ABNORMAL HIGH (ref 70–99)
Potassium: 4 mmol/L (ref 3.5–5.1)
Sodium: 134 mmol/L — ABNORMAL LOW (ref 135–145)

## 2021-11-11 LAB — TROPONIN I (HIGH SENSITIVITY)
Troponin I (High Sensitivity): 9 ng/L (ref <18)
Troponin I (High Sensitivity): 6 ng/L (ref <18)

## 2021-11-11 LAB — RESP PANEL BY RT-PCR (FLU A&B, COVID) ARPGX2
Influenza A by PCR: NEGATIVE
Influenza B by PCR: NEGATIVE
SARS Coronavirus 2 by RT PCR: NEGATIVE

## 2021-11-11 LAB — ECHOCARDIOGRAM COMPLETE: S' Lateral: 2.5 cm

## 2021-11-11 LAB — BRAIN NATRIURETIC PEPTIDE: B Natriuretic Peptide: 174.7 pg/mL — ABNORMAL HIGH (ref 0.0–100.0)

## 2021-11-11 MED ORDER — ATORVASTATIN CALCIUM 20 MG PO TABS
40.0000 mg | ORAL_TABLET | Freq: Every day | ORAL | Status: DC
  Administered 2021-11-11 – 2021-11-13 (×3): 40 mg via ORAL
  Filled 2021-11-11 (×4): qty 2

## 2021-11-11 MED ORDER — FLECAINIDE ACETATE 50 MG PO TABS
50.0000 mg | ORAL_TABLET | Freq: Three times a day (TID) | ORAL | Status: DC
  Administered 2021-11-11 – 2021-11-12 (×4): 50 mg via ORAL
  Filled 2021-11-11 (×6): qty 1

## 2021-11-11 MED ORDER — BUMETANIDE 0.25 MG/ML IJ SOLN
1.0000 mg | Freq: Two times a day (BID) | INTRAMUSCULAR | Status: DC
  Administered 2021-11-11 – 2021-11-14 (×7): 1 mg via INTRAVENOUS
  Filled 2021-11-11 (×8): qty 4

## 2021-11-11 MED ORDER — IPRATROPIUM-ALBUTEROL 0.5-2.5 (3) MG/3ML IN SOLN
3.0000 mL | Freq: Four times a day (QID) | RESPIRATORY_TRACT | Status: DC | PRN
  Administered 2021-11-12: 3 mL via RESPIRATORY_TRACT
  Filled 2021-11-11: qty 3

## 2021-11-11 MED ORDER — CARVEDILOL 3.125 MG PO TABS
3.1250 mg | ORAL_TABLET | Freq: Two times a day (BID) | ORAL | Status: DC
Start: 2021-11-11 — End: 2021-11-14
  Administered 2021-11-11 – 2021-11-14 (×7): 3.125 mg via ORAL
  Filled 2021-11-11 (×7): qty 1

## 2021-11-11 MED ORDER — ONDANSETRON HCL 4 MG PO TABS: 4.0000 mg | ORAL_TABLET | Freq: Four times a day (QID) | ORAL | Status: DC | PRN

## 2021-11-11 MED ORDER — POTASSIUM CHLORIDE CRYS ER 20 MEQ PO TBCR
40.0000 meq | EXTENDED_RELEASE_TABLET | Freq: Two times a day (BID) | ORAL | Status: DC
  Administered 2021-11-11 – 2021-11-14 (×7): 40 meq via ORAL
  Filled 2021-11-11 (×7): qty 2

## 2021-11-11 MED ORDER — ALBUTEROL SULFATE HFA 108 (90 BASE) MCG/ACT IN AERS: 2.0000 | INHALATION_SPRAY | Freq: Four times a day (QID) | RESPIRATORY_TRACT | Status: DC | PRN

## 2021-11-11 MED ORDER — TRAZODONE HCL 50 MG PO TABS
50.0000 mg | ORAL_TABLET | Freq: Every day | ORAL | Status: DC
Start: 2021-11-11 — End: 2021-11-14
  Administered 2021-11-11 – 2021-11-13 (×3): 50 mg via ORAL
  Filled 2021-11-11 (×3): qty 1

## 2021-11-11 MED ORDER — FLUTICASONE FUROATE-VILANTEROL 100-25 MCG/ACT IN AEPB
1.0000 | INHALATION_SPRAY | Freq: Every day | RESPIRATORY_TRACT | Status: DC
Start: 2021-11-12 — End: 2021-11-14
  Administered 2021-11-12 – 2021-11-14 (×3): 1 via RESPIRATORY_TRACT
  Filled 2021-11-11 (×2): qty 28

## 2021-11-11 MED ORDER — PANTOPRAZOLE SODIUM 40 MG PO TBEC
40.0000 mg | DELAYED_RELEASE_TABLET | Freq: Every day | ORAL | Status: DC
  Administered 2021-11-11 – 2021-11-14 (×4): 40 mg via ORAL
  Filled 2021-11-11 (×4): qty 1

## 2021-11-11 MED ORDER — LEVOTHYROXINE SODIUM 25 MCG PO TABS
25.0000 ug | ORAL_TABLET | Freq: Every day | ORAL | Status: DC
  Administered 2021-11-12 – 2021-11-14 (×3): 25 ug via ORAL
  Filled 2021-11-11 (×3): qty 1

## 2021-11-11 MED ORDER — OXYCODONE-ACETAMINOPHEN 7.5-325 MG PO TABS
1.0000 | ORAL_TABLET | Freq: Three times a day (TID) | ORAL | Status: DC | PRN
  Administered 2021-11-11 – 2021-11-13 (×3): 1 via ORAL
  Filled 2021-11-11 (×3): qty 1

## 2021-11-11 MED ORDER — ACETAMINOPHEN 650 MG RE SUPP: 650.0000 mg | Freq: Four times a day (QID) | RECTAL | Status: DC | PRN

## 2021-11-11 MED ORDER — LORATADINE 10 MG PO TABS
10.0000 mg | ORAL_TABLET | Freq: Every day | ORAL | Status: DC
  Administered 2021-11-11 – 2021-11-14 (×4): 10 mg via ORAL
  Filled 2021-11-11 (×4): qty 1

## 2021-11-11 MED ORDER — LISINOPRIL 20 MG PO TABS
40.0000 mg | ORAL_TABLET | Freq: Every day | ORAL | Status: DC
  Administered 2021-11-11 – 2021-11-14 (×4): 40 mg via ORAL
  Filled 2021-11-11: qty 4
  Filled 2021-11-11 (×3): qty 2

## 2021-11-11 MED ORDER — ONDANSETRON HCL 4 MG/2ML IJ SOLN: 4.0000 mg | Freq: Four times a day (QID) | INTRAMUSCULAR | Status: DC | PRN

## 2021-11-11 MED ORDER — ACETAMINOPHEN 325 MG PO TABS: 650.0000 mg | ORAL_TABLET | Freq: Four times a day (QID) | ORAL | Status: DC | PRN

## 2021-11-11 MED ORDER — ALPRAZOLAM 0.25 MG PO TABS
0.2500 mg | ORAL_TABLET | Freq: Two times a day (BID) | ORAL | Status: DC | PRN
  Administered 2021-11-12: 0.25 mg via ORAL
  Filled 2021-11-11: qty 1

## 2021-11-11 MED ORDER — ENOXAPARIN SODIUM 60 MG/0.6ML IJ SOSY
0.5000 mg/kg | PREFILLED_SYRINGE | INTRAMUSCULAR | Status: DC
  Administered 2021-11-11: 42.5 mg via SUBCUTANEOUS
  Filled 2021-11-11: qty 0.6

## 2021-11-11 MED ORDER — UMECLIDINIUM BROMIDE 62.5 MCG/ACT IN AEPB
1.0000 | INHALATION_SPRAY | Freq: Every day | RESPIRATORY_TRACT | Status: DC
  Administered 2021-11-12 – 2021-11-14 (×3): 1 via RESPIRATORY_TRACT
  Filled 2021-11-11 (×2): qty 7

## 2021-11-11 MED ORDER — SALINE SPRAY 0.65 % NA SOLN
1.0000 | NASAL | Status: DC | PRN
  Administered 2021-11-11: 1 via NASAL
  Filled 2021-11-11 (×2): qty 44

## 2021-11-11 MED ORDER — SERTRALINE HCL 50 MG PO TABS
100.0000 mg | ORAL_TABLET | Freq: Every day | ORAL | Status: DC
  Administered 2021-11-11 – 2021-11-14 (×4): 100 mg via ORAL
  Filled 2021-11-11 (×4): qty 2

## 2021-11-11 MED ORDER — BUMETANIDE 0.25 MG/ML IJ SOLN
1.0000 mg | Freq: Once | INTRAMUSCULAR | Status: AC
  Administered 2021-11-11: 1 mg via INTRAVENOUS
  Filled 2021-11-11: qty 4

## 2021-11-11 MED ORDER — APIXABAN 5 MG PO TABS
5.0000 mg | ORAL_TABLET | Freq: Two times a day (BID) | ORAL | Status: DC
  Administered 2021-11-11 – 2021-11-14 (×6): 5 mg via ORAL
  Filled 2021-11-11 (×6): qty 1

## 2021-11-11 NOTE — Assessment & Plan Note (Signed)
IV Lasix.  Continue carvedilol and lisinopril.  Daily weights with intake and output monitoring.  Echocardiogram ?

## 2021-11-11 NOTE — Telephone Encounter (Signed)
Pt called and canceled appt due to she was admitted hold for NIV ?

## 2021-11-11 NOTE — Assessment & Plan Note (Signed)
Continue carvedilol and apixaban and flecainide ?

## 2021-11-11 NOTE — Procedures (Signed)
[facility] [place], [address]    Complete Pulmonary Function Testing Interpretation:  FINDINGS:  The forced vital capacity is moderately decreased the FEV1 was 0.97 L which is 57% predicted and is moderately decreased.  FEV1 FVC ratio was mildly decreased.  IMPRESSION:  Spirometry is consistent with moderate obstructive lung disease patient was not able to do DLCO and lung volumes.  Allyne Gee, MD [facility] Pulmonary Critical Care Medicine Sleep Medicine

## 2021-11-11 NOTE — Assessment & Plan Note (Signed)
No acute disease suspected ?

## 2021-11-11 NOTE — Progress Notes (Signed)
PHARMACIST - PHYSICIAN COMMUNICATION ? ?CONCERNING:  Enoxaparin (Lovenox) for DVT Prophylaxis  ? ? ?RECOMMENDATION: ?Patient was prescribed enoxaprin 40mg  q24 hours for VTE prophylaxis.  ? ?There were no vitals filed for this visit. ? ?There is no height or weight on file to calculate BMI. ? ?Estimated Creatinine Clearance: 50.7 mL/min (by C-G formula based on SCr of 0.9 mg/dL). ? ? ?Based on [place] patient is candidate for enoxaparin 0.5mg /kg TBW SQ every 24 hours based on BMI being >30. ? ? ?DESCRIPTION: ?Pharmacy has adjusted enoxaparin dose per [facility] policy. ? ?Patient is now receiving enoxaparin 0.5 mg/kg every 24 hours  ? ?Renda Rolls, PharmD, MBA ?11/11/2021 ?2:28 AM ? ? ?

## 2021-11-11 NOTE — Assessment & Plan Note (Signed)
Continue home carvedilol and amlodipine ?

## 2021-11-11 NOTE — Assessment & Plan Note (Signed)
Secondary to CHF exacerbation.  Supplemental O2 to keep sats over 90% ?

## 2021-11-11 NOTE — Progress Notes (Signed)
Interval events noted.  Breathing is a little better.  She thinks she has abdominal distention from fluid.  Vital signs are stable.  Continue IV bumetanide. ?

## 2021-11-11 NOTE — H&P (Signed)
History and Physical    Patient: [name] WUG:[id number] DOB: 1945/09/24 DOA: 11/10/2021 DOS: the patient was seen and examined on 11/11/2021 PCP: [name], MD  Patient coming from: Home  Chief Complaint:  Chief Complaint  Patient presents with   Shortness of Breath    HPI: [name] is a 76 y.o. female with medical history significant for severe aortic stenosis status post bioprosthetic AV, chronic respiratory failure on 3 L of oxygen continuous, paroxysmal A. fib status post multiple cardioversions on flecainide and on chronic anticoagulation with Eliquis.  She presents to the ER for evaluation of shortness of breath which has progressively worsened over the last couple of days and mostly with exertion severe aortic stenosis status post bioprosthetic AV, chronic respiratory failure on 4 L of oxygen continuous, paroxysmal A. fib status post multiple cardioversions on flecainide and on chronic anticoagulation with Eliquis.  She presents to the ER for evaluation of shortness of breath and respiratory distress.   EMS reports sats on 4 L nasal cannula oxygen dropped to 82% while ambulating approximately 10 feet.  And she was placed on 6 L.  Patient reports she has seen PCP for same and has had her Lasix dosages adjusted.  Endorses associated chest tightness.  Denies fever, cough, abdominal pain, nausea, vomiting or dizziness   ED course: On arrival temperature 99.7, tachypneic to 21 but otherwise normal vitals.  Troponin 9, BNP 174, hemoglobin 10.5, otherwise unremarkable.  EKG, personally viewed and interpreted: Paced rhythm at 72.  Chest x-ray with no active cardiopulmonary disease.  Chronic interstitial opacities.  Patient treated with IV Bumex.  Hospitalist consulted for admission.  Review of Systems: As mentioned in the history of present illness. All other systems reviewed and are negative. 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  s/p bioprosthetic valve replacement 2008 at [facility];  b. 01/2015 Echo: EF 60-65%, no rwma, Gr1 DD, mildly dil LA, nl RV fxn.   Atrial fibrillation with RVR ([place]) 01/11/2015   Atrial flutter ([place])    a. 08/2016 s/p DCCV.  Remains on flecainide 50 mg bid.   Basal skull fracture ([name]) 20 yrs ago   CHF (congestive heart failure) ([name])    Chronic respiratory failure ([name])    COPD (chronic obstructive pulmonary disease) ([place])    a. 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 2008 prior to Aortic aneurysm repair-->nl cors.   History of stress test    a. 10/2015 MV: no ischemia/infarct.   HLD (hyperlipidemia)    HTN (hypertension)    Hypothyroidism    Obesity    PAF (paroxysmal atrial fibrillation) ([name])    a. on Eliquis; b. CHADS2VASc = 3 (HTN, age x 85, female).   Paroxysmal atrial fibrillation ([place]) 01/24/2015   Right upper quadrant abdominal tenderness without rebound tenderness 03/02/2018   S/P ascending aortic aneurysm repair 2008   Past Surgical History:  Procedure Laterality Date   ABDOMINAL AORTIC ANEURYSM REPAIR  2008   ABDOMINAL HYSTERECTOMY     AORTIC VALVE REPLACEMENT  2008   CARDIAC CATHETERIZATION     [facility]   CARDIOVERSION N/A 08/15/2018   Procedure: CARDIOVERSION;  Surgeon: [name], MD;  Location: [facility] ORS;  Service: Cardiovascular;  Laterality: N/A;  CARDIOVERSION N/A 11/14/2018   Procedure: CARDIOVERSION (CATH LAB);  Surgeon: [name], MD;  Location: [facility] ORS;  Service: Cardiovascular;  Laterality: N/A;   CARDIOVERSION N/A 06/05/2019   Procedure: CARDIOVERSION;  Surgeon: [name], MD;  Location: [place] [facility];  Service: Cardiovascular;  Laterality: N/A;   CARDIOVERSION N/A 08/14/2019   Procedure: CARDIOVERSION;  Surgeon: [name], MD;  Location: [place] [facility];  Service: Cardiovascular;  Laterality: N/A;   CARDIOVERSION N/A 11/09/2019   Procedure:  CARDIOVERSION;  Surgeon: Isaias Cowman, MD;  Location: [place] [facility];  Service: Cardiovascular;  Laterality: N/A;   CARDIOVERSION N/A 01/17/2020   Procedure: CARDIOVERSION;  Surgeon: Isaias Cowman, MD;  Location: [facility] ORS;  Service: Cardiovascular;  Laterality: N/A;   CARPAL TUNNEL RELEASE     ELECTROPHYSIOLOGIC STUDY N/A 08/17/2016   Procedure: Cardioversion;  Surgeon: [name], MD;  Location: [facility] ORS;  Service: Cardiovascular;  Laterality: N/A;   TUMOR EXCISION Left    x3 (arm)   Social History:  reports that she has quit smoking. Her smoking use included cigarettes. She has never used smokeless tobacco. She reports that she does not drink alcohol and does not use drugs.  Allergies  Allergen Reactions   Benadryl [Diphenhydramine Hcl (Sleep)] Palpitations   Cetirizine Palpitations   Lasix [Furosemide] Rash   Levaquin [Levofloxacin In D5w] Other (See Comments)    Reaction:  Fatigue and muscle soreness   Meloxicam Rash   Soy Allergy Hives and Nausea And Vomiting   Sulfa Antibiotics Rash    Family History  Problem Relation Age of Onset   Stroke Father    Stroke Paternal Grandmother     Prior to Admission medications   Medication Sig Start Date End Date Taking? Authorizing Provider  albuterol (VENTOLIN HFA) 108 (90 Base) MCG/ACT inhaler Inhale 2 puffs into the lungs every 6 (six) hours as needed for wheezing or shortness of breath. 10/27/21   Merlyn Lot, MD  ALPRAZolam Duanne Moron) 0.25 MG tablet Take 1 tablet (0.25 mg total) by mouth 2 (two) times daily as needed for anxiety. 04/21/21   [name], MD  amLODipine (NORVASC) 10 MG tablet Take 1 tablet by mouth daily. 07/08/21 07/08/22  [provider]  amoxicillin (AMOXIL) 500 MG capsule Take 4 cap po 1 hrs before dental procedure 10/13/21   Jonetta Osgood, NP  apixaban (ELIQUIS) 5 MG TABS tablet Take 1 tablet (5 mg total) by mouth 2 (two) times daily. 11/04/21   [name], MD  atorvastatin (LIPITOR) 40  MG tablet 1 tab po qhs 10/01/21   [name], MD  bumetanide (BUMEX) 1 MG tablet TAKE 2 TABLETS BY MOUTH TWICE DAILY 08/31/21   Darylene Price A, FNP  Calcium Carbonate-Vitamin D (CALCIUM 600+D PO) Take 1 tablet by mouth daily.    [provider]  carvedilol (COREG) 3.125 MG tablet Take 3.125 mg by mouth 2 (two) times daily with a meal.    [provider]  Coenzyme Q10 (COQ10) 200 MG CAPS Take 200 mg by mouth daily.    [provider]  diclofenac Sodium (VOLTAREN) 1 % GEL SMARTSIG:Gram(s) Topical Twice Daily 09/09/20   [provider]  dicyclomine (BENTYL) 10 MG capsule Take 1 capsule (10 mg total) by mouth 3 (three) times daily as needed for spasms. 07/22/21   Lucilla Lame, MD  ferrous sulfate 325 (65 FE) MG tablet Take 2 tablets (650 mg total) by mouth 2 (two) times daily with a meal. Patient taking differently: Take 325 mg  by mouth. Takes 4 tablets by mouth twice a day 01/30/21   Jennye Boroughs, MD  flecainide (TAMBOCOR) 50 MG tablet Take 1 tablet (50 mg total) by mouth 2 (two) times daily. PLEASE CONTACT OFFICE [phone] TO SCHEDULE APPOINTMENT FOR FUTURE REFILLS 02/23/20   Deboraha Sprang, MD  Fluticasone-Umeclidin-Vilant (TRELEGY ELLIPTA) 100-62.5-25 MCG/ACT AEPB Inhale 1 puff into the lungs daily. 10/14/21   Jonetta Osgood, NP  levothyroxine (SYNTHROID) 25 MCG tablet Take 1 tablet (25 mcg total) by mouth daily before breakfast. 09/29/21   [name], MD  lisinopril (ZESTRIL) 40 MG tablet TAKE 1 TABLET(40 MG) BY MOUTH DAILY(DOSE INCREASE) 09/29/21   Alisa Graff, FNP  loratadine (CLARITIN) 10 MG tablet Take 10 mg by mouth daily.    [provider]  oxyCODONE-acetaminophen (PERCOCET) 7.5-325 MG tablet Take one tab in am and half in pm for severe shoulder pain 10/14/21   Jonetta Osgood, NP  pantoprazole (PROTONIX) 40 MG tablet Take 1 tablet (40 mg total) by mouth daily. 10/01/21   [name], MD  potassium chloride (KLOR-CON) 10 MEQ tablet Take  4 tablets (40 mEq total) by mouth 2 (two) times daily. 10/06/21   Jonetta Osgood, NP  sertraline (ZOLOFT) 100 MG tablet Take one tab a day for Anxiety 11/04/21   [name], MD  traMADol Veatrice Bourbon) 50 MG tablet Take one tab po bid for pain 11/04/21   [name], MD  traZODone (DESYREL) 50 MG tablet Take 1 tablet (50 mg total) by mouth at bedtime. 10/01/21   [name], MD    Physical Exam: Vitals:   11/10/21 2325 11/11/21 0100 11/11/21 0130  BP: 131/60 124/64 117/65  Pulse: 72 70 70  Resp: (!) 21 16 20   Temp: 99.7 F (37.6 C)    TempSrc: Oral    SpO2: 96% 98% 96%   Physical Exam Vitals and nursing note reviewed.  Constitutional:      General: She is not in acute distress.    Appearance: Normal appearance.  HENT:     Head: Normocephalic and atraumatic.  Cardiovascular:     Rate and Rhythm: Normal rate and regular rhythm.     Pulses: Normal pulses.     Heart sounds: Normal heart sounds. No murmur heard. Pulmonary:     Effort: Pulmonary effort is normal. Tachypnea present.     Breath sounds: No decreased breath sounds, wheezing or rhonchi.     Comments: Faint bibasilar rales Abdominal:     General: Bowel sounds are normal.     Palpations: Abdomen is soft.     Tenderness: There is no abdominal tenderness.  Musculoskeletal:        General: No swelling or tenderness. Normal range of motion.     Cervical back: Normal range of motion and neck supple.  Skin:    General: Skin is warm and dry.  Neurological:     General: No focal deficit present.     Mental Status: She is alert. Mental status is at baseline.  Psychiatric:        Mood and Affect: Mood normal.        Behavior: Behavior normal.     Data Reviewed: Relevant notes from primary care and specialist visits, past discharge summaries as available in EHR, including Care Everywhere. Prior diagnostic testing as pertinent to current admission diagnoses Updated medications and problem lists for reconciliation ED  course, including vitals, labs, imaging, treatment and response to treatment Triage notes, nursing and pharmacy notes  and ED provider's notes Notable results as noted in HPI   Assessment and Plan: * Acute on chronic diastolic CHF (congestive heart failure) ([name]) IV Lasix.  Continue carvedilol and lisinopril.  Daily weights with intake and output monitoring.  Echocardiogram  Acute on chronic respiratory failure with hypoxia ([name]) Secondary to CHF exacerbation.  Supplemental O2 to keep sats over 90%  Cardiac pacemaker in situ Pacemaker check was ordered from the ED  History of aortic valve replacement with bioprosthetic valve No acute disease suspected  Paroxysmal atrial fibrillation ([name]) Continue carvedilol and apixaban and flecainide  Essential hypertension Continue home carvedilol and amlodipine       Advance Care Planning:   Code Status: Prior   Consults: none  Family Communication: none  Severity of Illness: The appropriate patient status for this patient is INPATIENT. Inpatient status is judged to be reasonable and necessary in order to provide the required intensity of service to ensure the patient's safety. The patient's presenting symptoms, physical exam findings, and initial radiographic and laboratory data in the context of their chronic comorbidities is felt to place them at high risk for further clinical deterioration. Furthermore, it is not anticipated that the patient will be medically stable for discharge from the hospital within 2 midnights of admission.   * I certify that at the point of admission it is my clinical judgment that the patient will require inpatient hospital care spanning beyond 2 midnights from the point of admission due to high intensity of service, high risk for further deterioration and high frequency of surveillance required.*  Author: Athena Masse, MD 11/11/2021 2:16 AM  For on call review [email or web address].

## 2021-11-11 NOTE — Telephone Encounter (Signed)
Spoke with pt that we will order ct chest without contrast let on call dr know and also will call in schedule in [facility]  ?

## 2021-11-11 NOTE — ED Notes (Signed)
Patient sitting in bed at this time. Respirations even and unlabored. Bed lowered and locked. Call light within reach. ?

## 2021-11-11 NOTE — ED Notes (Signed)
Patient stating she is uncomfortable in stretcher. Patient repositioned and given pillows. Patient wanting to get out of bed and sit in chair. Patient informed that this RN did not believe that was a good idea at this time due to patient's increased work of breathing when moving around in stretcher. While moving O2 dropped down to 87% on the 5L. Upon sitting still and RN informing patient to breathe in nose and out of mouth O2 back up 95% on the 5L. ?

## 2021-11-11 NOTE — Progress Notes (Signed)
*  PRELIMINARY RESULTS* ?Echocardiogram ?2D Echocardiogram has been performed. ? ?Marvia Troost, [place] ?11/11/2021, 11:35 AM ?

## 2021-11-11 NOTE — Assessment & Plan Note (Signed)
Pacemaker check was ordered from the ED ?

## 2021-11-11 NOTE — TOC Initial Note (Addendum)
Transition of Care (TOC) - Initial/Assessment Note  ? ? ?Patient Details  ?Name: [name] ?MRN: [id number] ?Date of Birth: 1945/09/14 ? ?Transition of Care (TOC) CM/SW Contact:    ?Shelbie Hutching, RN ?Phone Number: ?11/11/2021, 12:18 PM ? ?Clinical Narrative:                 ?Patient admitted to the hospital with acute on chronic CHF.  RNCM met with patient at the bedside in the ER, introduced self and explained role.  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  She lives at home and her granddaughter lives with her.  She reports that the granddaughter is not much help at home.  Patient is independent, she has been having difficulty breathing for the past 3 weeks.  Patient wears chronic oxygen at 4 L [place] at home.  She does not know what company her oxygen is from her CPAP supplies comes from [facility].   ?Patient drives but only to her doctors appointments, she gets her granddaughter to order groceries for her.   ?She is not sure about home health services but she may be agreeable at discharge.   ? ?Patient is current with her cardiologist Dr. Lorinda Creed,  PCP is Dr. Humphrey Rolls.   ? ?TOC will follow ? ? ?Expected Discharge Plan: [place] ?Barriers to Discharge: Continued Medical Work up ? ? ?Patient Goals and CMS Choice ?Patient states their goals for this hospitalization and ongoing recovery are:: Patient reports she just feels so bad and wants to get her strength back ?  ?  ? ?Expected Discharge Plan and Services ?Expected Discharge Plan: [place] ?  ?Discharge Planning Services: CM Consult ?  ?Living arrangements for the past 2 months: Apartment ?                ?DME Arranged: N/A ?DME Agency: NA ?  ?  ?  ?  ?  ?  ?  ?  ? ?Prior Living Arrangements/Services ?Living arrangements for the past 2 months: Apartment ?Lives with:: Relatives (Granddaughter) ?Patient language and need for interpreter reviewed:: Yes ?Do you  feel safe going back to the place where you live?: Yes      ?Need for Family Participation in Patient Care: Yes (Comment) ?Care giver support system in place?: Yes (comment) ?Current home services: DME (oxygen and Cpap) ?Criminal Activity/Legal Involvement Pertinent to Current Situation/Hospitalization: No - Comment as needed ? ?Activities of Daily Living ?  ?  ? ?Permission Sought/Granted ?Permission sought to share information with : Case Manager, Family Supports ?Permission granted to share information with : Yes, Verbal Permission Granted ? Share Information with NAME: Ethel Rana ?   ? Permission granted to share info w Relationship: son ? Permission granted to share info w Contact Information: [phone] ? ?Emotional Assessment ?Appearance:: Appears stated age ?Attitude/Demeanor/Rapport: Engaged, Complaining ?Affect (typically observed): Irritable, Frustrated ?Orientation: : Oriented to Self, Oriented to Place, Oriented to  Time, Oriented to Situation ?Alcohol / Substance Use: Not Applicable ?Psych Involvement: No (comment) ? ?Admission diagnosis:  CHF exacerbation ([place]) [I50.9] ?Patient Active Problem List  ? Diagnosis Date Noted  ? CHF exacerbation ([place]) 11/11/2021  ? Acute on chronic respiratory failure with hypoxia ([place]) 11/11/2021  ? Unable to maintain body in lying position 10/16/2021  ? Orthopnea 10/16/2021  ? Class 2 obesity with alveolar hypoventilation, serious comorbidity, and body mass index (  BMI) of 39.0 to 39.9 in adult [facility]) 10/16/2021  ? At high risk for postoperative complications 01/41/0301  ? Hearing loss 08/12/2021  ? Long term prescription benzodiazepine use 04/30/2021  ? Chronic shoulder pain (1ry area of Pain) (Bilateral) (L>R) 04/30/2021  ? Chronic upper extremity pain (2ry area of Pain) (Bilateral) (L>R) 04/30/2021  ? Cervicalgia 04/30/2021  ? Chronic neck pain (3ry area of Pain) (Posterior) (Bilateral) (L>R) 04/30/2021  ? Shoulder blade pain (4th area of Pain) (Left) 04/30/2021  ?  Osteoarthritis of glenohumeral joint (Left) 04/30/2021  ? Osteoarthritis of AC (acromioclavicular) joint (Left) 04/30/2021  ? Chronic headaches (5th area of Pain) 04/30/2021  ? Osteoarthritis of glenohumeral joints (Bilateral) 04/30/2021  ? Osteoarthritis of acromioclavicular joints (Bilateral) 04/30/2021  ? Primary osteoarthritis of shoulders (Bilateral) 04/30/2021  ? DDD (degenerative disc disease), cervical 04/30/2021  ? Cervical radiculitis (Left) 04/30/2021  ? Cervical radiculopathy (Left) 04/30/2021  ? C6 radiculopathy (Left) 04/30/2021  ? C7 radiculopathy (Left) 04/30/2021  ? Wheelchair dependence 04/30/2021  ? Chronic pain syndrome 04/29/2021  ? Pharmacologic therapy 04/29/2021  ? Disorder of skeletal system 04/29/2021  ? Problems influencing health status 04/29/2021  ? Chronic anticoagulation (Eliquis) 03/14/2021  ? Hx of atrioventricular node ablation 03/14/2021  ? Symptomatic anemia 01/29/2021  ? Acute on chronic diastolic CHF (congestive heart failure) ([place]) 05/11/2020  ? COPD (chronic obstructive pulmonary disease) ([place])   ? Hypothyroidism   ? Cardiac pacemaker in situ 05/10/2020  ? Acute non-recurrent frontal sinusitis 04/22/2020  ? Vertigo 04/22/2020  ? S/P placement of cardiac pacemaker 02/21/2020  ? Atrial fibrillation status post cardioversion [facility]) 11/16/2019  ? Episode of moderate major depression ([place]) 10/10/2019  ? Persistent atrial fibrillation ([place])   ? Encounter for general adult medical examination with abnormal findings 07/10/2019  ? Encounter for screening mammogram for malignant neoplasm of breast 07/10/2019  ? Atopic dermatitis 05/02/2019  ? Paroxysmal atrial flutter ([place]) 08/11/2018  ? Encounter for long-term (current) use of medications 07/09/2018  ? Chronic left shoulder pain 07/09/2018  ? Conjunctivitis 07/09/2018  ? Oxygen dependent 07/09/2018  ? GAD (generalized anxiety disorder) 07/09/2018  ? Ovarian failure 07/09/2018  ? Positive colorectal cancer screening using Cologuard test  04/24/2018  ? Calculus of gallbladder with acute on chronic cholecystitis 04/08/2018  ? H/O: CVA (cerebrovascular accident) 04/08/2018  ? Dysuria 03/02/2018  ? Urinary tract infection without hematuria 03/02/2018  ? Right upper quadrant abdominal tenderness without rebound tenderness 03/02/2018  ? 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 Chronic obstructive pulmonary disease ([place]) 09/19/2017  ? Typical atrial flutter ([place])   ? Irritable bowel syndrome without diarrhea 01/29/2015  ? Essential hypertension 01/24/2015  ? Paroxysmal atrial fibrillation ([place]) 01/24/2015  ? History of aortic valve replacement with bioprosthetic valve 01/24/2015  ? Atrial fibrillation with RVR ([place]) 01/11/2015  ? 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 05/18/2014  ? Shoulder impingement syndrome (Left) 05/18/2014  ? Cellulitis and abscess 02/13/2014  ? MRSA (methicillin resistant Staphylococcus aureus) 02/13/2014  ? Aneurysm, ascending aorta 06/23/2013  ? Bicuspid aortic valve 06/23/2013  ? ?PCP:  [name], MD ?Pharmacy:   ?[id number] [address], [address] ?[address] ?[place] [address] ?Phone: [phone] Fax: [phone] ? ? ? ? ?Social Determinants of Health (SDOH) Interventions ?  ? ?Readmission Risk Interventions ?No flowsheet data found. ? ? ?

## 2021-11-11 NOTE — ED Notes (Signed)
Breakfast tray at bedside 

## 2021-11-12 ENCOUNTER — Telehealth: Payer: Self-pay

## 2021-11-12 ENCOUNTER — Other Ambulatory Visit: Payer: Self-pay

## 2021-11-12 ENCOUNTER — Encounter: Payer: Self-pay | Admitting: Internal Medicine

## 2021-11-12 DIAGNOSIS — I480 Paroxysmal atrial fibrillation: Secondary | ICD-10-CM

## 2021-11-12 DIAGNOSIS — I5033 Acute on chronic diastolic (congestive) heart failure: Secondary | ICD-10-CM | POA: Diagnosis not present

## 2021-11-12 DIAGNOSIS — J9621 Acute and chronic respiratory failure with hypoxia: Secondary | ICD-10-CM | POA: Diagnosis not present

## 2021-11-12 LAB — BASIC METABOLIC PANEL
Anion gap: 10 (ref 5–15)
BUN: 18 mg/dL (ref 8–23)
CO2: 26 mmol/L (ref 22–32)
Calcium: 9.1 mg/dL (ref 8.9–10.3)
Chloride: 99 mmol/L (ref 98–111)
Creatinine, Ser: 0.97 mg/dL (ref 0.44–1.00)
GFR, Estimated: >60 mL/min (ref >60)
Glucose, Bld: 114 mg/dL — ABNORMAL HIGH (ref 70–99)
Potassium: 4 mmol/L (ref 3.5–5.1)
Sodium: 135 mmol/L (ref 135–145)

## 2021-11-12 LAB — PULMONARY FUNCTION TEST

## 2021-11-12 MED ORDER — ORAL CARE MOUTH RINSE
15.0000 mL | Freq: Two times a day (BID) | OROMUCOSAL | Status: DC
  Administered 2021-11-12 – 2021-11-13 (×4): 15 mL via OROMUCOSAL

## 2021-11-12 MED ORDER — DICLOFENAC SODIUM 1 % EX GEL
2.0000 g | Freq: Three times a day (TID) | CUTANEOUS | Status: DC
Start: 2021-11-12 — End: 2021-11-14
  Administered 2021-11-12 – 2021-11-14 (×8): 2 g via TOPICAL
  Filled 2021-11-12: qty 100

## 2021-11-12 MED ORDER — CHLORHEXIDINE GLUCONATE 0.12 % MT SOLN
15.0000 mL | Freq: Two times a day (BID) | OROMUCOSAL | Status: DC
  Administered 2021-11-12 – 2021-11-14 (×5): 15 mL via OROMUCOSAL
  Filled 2021-11-12 (×5): qty 15

## 2021-11-12 MED ORDER — FLECAINIDE ACETATE 50 MG PO TABS
50.0000 mg | ORAL_TABLET | Freq: Two times a day (BID) | ORAL | Status: DC
Start: 2021-11-12 — End: 2021-11-14
  Administered 2021-11-12 – 2021-11-14 (×4): 50 mg via ORAL
  Filled 2021-11-12 (×4): qty 1

## 2021-11-12 NOTE — Progress Notes (Signed)
OT Cancellation Note ? ?Patient Details ?Name: [name] ?MRN: [id number] ?DOB: November 24, 1945 ? ? ?Cancelled Treatment:    Reason Eval/Treat Not Completed: Patient declined, no reason specified. Per conversation with PT, pt adamantly refusing therapy this date. Will re-attempt next date.  ? ?Dessie Coma, M.S. OTR/L  ?11/12/21, 4:18 PM  ?ascom [phone] ? ?

## 2021-11-12 NOTE — Plan of Care (Signed)
°  Problem: Education: °Goal: Ability to demonstrate management of disease process will improve °Outcome: Progressing °Goal: Ability to verbalize understanding of medication therapies will improve °Outcome: Progressing °Goal: Individualized Educational Video(s) °Outcome: Progressing °  °

## 2021-11-12 NOTE — Consult Note (Signed)
? ?  Heart Failure Nurse Navigator Note ? ?HFpEF 60 to 65%.  Regional wall motion abnormalities noted. ? ?She presented to the emergency room with complaints of worsening shortness of breath. ? ?Comorbidities: ? ?Cardiac valve replacement ?Chronic respiratory failure ?Paroxysmal atrial fibrillation ?COPD ?Hypertension ?Hyperlipidemia ?Obesity ? ?Medications: ? ?Apixaban 5 mg 2 times a day ?Atorvastatin 40 mg at bedtime ?Bumex 1 mg IV every 12 hours ?Carvedilol 3.125 mg 2 times a day with meals ?Flecainide 50 mg 3 times a day ?Levothyroxine 25 mcg daily ?Lisinopril 40 mg daily ?Potassium chloride 40 mEq 2 times a day ? ?Labs: ? ?Sodium 134, potassium 4, chloride 99, CO2 26, BUN 12, creatinine 0.9, BNP 174, hemoglobin 10.5, hematocrit 32.8 WBC 7.6, platelet count 244. ?Intake not documented ?Output 800 mL ?Weight 84.5 kg ? ?Initial meeting with patient on this admission. ? ?She states at home she had been weighing herself daily and had not seen any increased in her weight.  She did feel that her abdomen was getting larger and did not notice any lower extremity edema. ? ?She felt that she is more short of breath than usual, when checking her pulse ox with activity she would drop down to 75%.  She did not notify anybody of these readings. ? ?Discussed follow-up in the outpatient heart failure clinic, she was last seen on February 20 and referred to the emergency room due to not feeling that her shortness of breath was due to heart failure.  He had been seen in the ED on February 17 and then again on February 20, she was discharged at that time on albuterol and prednisone.  The patient states the only thing that prednisone helped was her arthritis aches. ? ?She verbalizes about being unhappy with the cares that she received in the emergency room.  She also becomes tearful when talking about her granddaughter and her daughter who she has lost contact with who is a drug addict.  Allowed her time to verbalize. ? ?She was  also made aware of the [facility] health program but the patient does not have a computer nor does she have the Internet, and she does not text or send emails. ? ?Discussed her follow-up in the outpatient heart failure clinic on February 22 at 9 AM or sooner if her condition changes she voices understanding. ? ?Pricilla Riffle RN CHFN ?

## 2021-11-12 NOTE — Progress Notes (Signed)
PT Cancellation Note ? ?Patient Details ?Name: [name] ?MRN: [id number] ?DOB: 1946-04-06 ? ? ?Cancelled Treatment:    Reason Eval/Treat Not Completed: Other (comment). Pt adamantly refusing PT despite education of PT role and expectations, did become agitated with repeated attempts/education. RN, MD, CSW informed. PT to re-attempt once more tomorrow.  ? ?Lieutenant Diego PT, DPT ?3:42 PM,11/12/21 ? ?

## 2021-11-12 NOTE — Progress Notes (Signed)
PROGRESS NOTE    [name]  FBP:[id number] DOB: 1946/08/07 DOA: 11/10/2021 PCP: [name], MD   Assessment & Plan:   Principal Problem:   Acute on chronic diastolic CHF (congestive heart failure) ([place]) Active Problems:   Acute on chronic respiratory failure with hypoxia ([name])   Essential hypertension   Paroxysmal atrial fibrillation ([name])   History of aortic valve replacement with bioprosthetic valve   Cardiac pacemaker in situ  Acute on chronic diastolic CHF: continue on IV bumex, lisinopril & coreg. Monitor I/Os and daily weights. Echo shows EF 60-65%, LV demonstrates regional wall motion abnormalities. Cardio consulted    Acute on chronic hypoxic respiratory failure: likely secondary to CHF exacerbation. Continue on supplemental oxygen and wean as tolerated    Hx of pacemaker: check ordered in ED    Hx of aortic valve replacement: w/ bioprosthetic valve.   PAF: continue on coreg, eliquis & flecainide    HTN: continue on coreg, amlodipine     DVT prophylaxis: eliquis  Code Status: full  Family Communication:  Disposition Plan: likely d/c back home   Level of care: Progressive  Status is: Inpatient Remains inpatient appropriate because: still requiring IV bumex and cardio was consulted   Consultants:  Cardio   Procedures:   Antimicrobials:    Subjective: Pt c/o shortness of breath  Objective: Vitals:   11/12/21 0621 11/12/21 0711 11/12/21 0721 11/12/21 1108  BP:  114/65  121/67  Pulse:  73  69  Resp:      Temp:  98.1 F (36.7 C)  98 F (36.7 C)  TempSrc:    Oral  SpO2:  97% 95% 95%  Weight: 84.5 kg     Height:        Intake/Output Summary (Last 24 hours) at 11/12/2021 1218 Last data filed at 11/12/2021 1024 Gross per 24 hour  Intake 200 ml  Output 800 ml  Net -600 ml   Filed Weights   11/12/21 0004 11/12/21 0621  Weight: 85.8 kg 84.5 kg    Examination:  General exam: Appears calm and comfortable  Respiratory system: decreased  breath sounds b/l  Cardiovascular system: S1 & S2 +. No rubs, gallops or clicks.  Gastrointestinal system: Abdomen is nondistended, soft and nontender.  Normal bowel sounds heard. Central nervous system: Alert and oriented. Moves all extremities  Psychiatry: Judgement and insight appear normal. Flat mood and affect     Data Reviewed: I have personally reviewed following labs and imaging studies  CBC: Recent Labs  Lab 11/10/21 2352  WBC 7.6  NEUTROABS 5.3  HGB 10.5*  HCT 32.8*  MCV 90.9  PLT 824   Basic Metabolic Panel: Recent Labs  Lab 11/10/21 2327  NA 134*  K 4.0  CL 99  CO2 26  GLUCOSE 122*  BUN 12  CREATININE 0.90  CALCIUM 9.0   GFR: Estimated Creatinine Clearance: 50.5 mL/min (by C-G formula based on SCr of 0.9 mg/dL). Liver Function Tests: No results for input(s): AST, ALT, ALKPHOS, BILITOT, PROT, ALBUMIN in the last 168 hours. No results for input(s): LIPASE, AMYLASE in the last 168 hours. No results for input(s): AMMONIA in the last 168 hours. Coagulation Profile: No results for input(s): INR, PROTIME in the last 168 hours. Cardiac Enzymes: No results for input(s): CKTOTAL, CKMB, CKMBINDEX, TROPONINI in the last 168 hours. BNP (last 3 results) No results for input(s): PROBNP in the last 8760 hours. HbA1C: No results for input(s): HGBA1C in the last 72 hours. CBG: No results  for input(s): GLUCAP in the last 168 hours. Lipid Profile: No results for input(s): CHOL, HDL, LDLCALC, TRIG, CHOLHDL, LDLDIRECT in the last 72 hours. Thyroid Function Tests: No results for input(s): TSH, T4TOTAL, FREET4, T3FREE, THYROIDAB in the last 72 hours. Anemia Panel: No results for input(s): VITAMINB12, FOLATE, FERRITIN, TIBC, IRON, RETICCTPCT in the last 72 hours. Sepsis Labs: No results for input(s): PROCALCITON, LATICACIDVEN in the last 168 hours.  Recent Results (from the past 240 hour(s))  Resp Panel by RT-PCR (Flu A&B, Covid) Nasopharyngeal Swab     Status: None    Collection Time: 11/10/21 11:52 PM   Specimen: Nasopharyngeal Swab; Nasopharyngeal(NP) swabs in vial transport medium  Result Value Ref Range Status   SARS Coronavirus 2 by RT PCR NEGATIVE NEGATIVE Final    Comment: (NOTE) SARS-CoV-2 target nucleic acids are NOT DETECTED.  The SARS-CoV-2 RNA is generally detectable in upper respiratory specimens during the acute phase of infection. The lowest concentration of SARS-CoV-2 viral copies this assay can detect is 138 copies/mL. A negative result does not preclude SARS-Cov-2 infection and should not be used as the sole basis for treatment or other patient management decisions. A negative result may occur with  improper specimen collection/handling, submission of specimen other than nasopharyngeal swab, presence of viral mutation(s) within the areas targeted by this assay, and inadequate number of viral copies(<138 copies/mL). A negative result must be combined with clinical observations, patient history, and epidemiological information. The expected result is Negative.  Fact Sheet for Patients:  EntrepreneurPulse.com.au  Fact Sheet for Healthcare Providers:  IncredibleEmployment.be  This test is no t yet approved or cleared by the [place] FDA and  has been authorized for detection and/or diagnosis of SARS-CoV-2 by FDA under an Emergency Use Authorization (EUA). This EUA will remain  in effect (meaning this test can be used) for the duration of the COVID-19 declaration under Section 564(b)(1) of the Act, 21 U.S.C.section 360bbb-3(b)(1), unless the authorization is terminated  or revoked sooner.       Influenza A by PCR NEGATIVE NEGATIVE Final   Influenza B by PCR NEGATIVE NEGATIVE Final    Comment: (NOTE) The Xpert Xpress SARS-CoV-2/FLU/RSV plus assay is intended as an aid in the diagnosis of influenza from Nasopharyngeal swab specimens and should not be used as a sole basis for treatment.  Nasal washings and aspirates are unacceptable for Xpert Xpress SARS-CoV-2/FLU/RSV testing.  Fact Sheet for Patients: EntrepreneurPulse.com.au  Fact Sheet for Healthcare Providers: IncredibleEmployment.be  This test is not yet approved or cleared by the [place] FDA and has been authorized for detection and/or diagnosis of SARS-CoV-2 by FDA under an Emergency Use Authorization (EUA). This EUA will remain in effect (meaning this test can be used) for the duration of the COVID-19 declaration under Section 564(b)(1) of the Act, 21 U.S.C. section 360bbb-3(b)(1), unless the authorization is terminated or revoked.  Performed at [facility], [address] [place], [place] [address]          Radiology Studies: DG Chest 2 View  Result Date: 11/11/2021 CLINICAL DATA:  Shortness of breath EXAM: CHEST - 2 VIEW COMPARISON:  10/27/2021, 03/17/2021 FINDINGS: Post sternotomy changes. Left-sided pacing device as before. Mild cardiomegaly. Mild diffuse reticular interstitial opacity likely due to chronic change. Subsegmental atelectasis or scar at the bases. No pleural effusion or confluent airspace disease. Aortic atherosclerosis. IMPRESSION: No active cardiopulmonary disease. Mild cardiomegaly. Chronic interstitial opacities. Electronically Signed   By: Donavan Foil M.D.   On: 11/11/2021 00:18  ECHOCARDIOGRAM COMPLETE  Result Date: 11/11/2021    ECHOCARDIOGRAM REPORT   Patient Name:   [name] Date of Exam: 11/11/2021 Medical Rec #:  [id number]     Height:       58.0 in Accession #:    [id number]    Weight:       187.6 lb Date of Birth:  1946/02/21    BSA:          1.772 m Patient Age:    76 years      BP:           106/59 mmHg Patient Gender: F             HR:           70 bpm. Exam Location:  [facility] Procedure: 2D Echo, Cardiac Doppler and Color Doppler Indications:     Syncope R55  History:         Patient has prior history of Echocardiogram  examinations, most                  recent 08/12/2017. Arrythmias:Atrial Fibrillation; Risk                  Factors:Hypertension. 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  Sonographer:     Sherrie Sport Referring Phys:  [id number] Athena Masse Diagnosing Phys: Donnelly Angelica  Sonographer Comments: No apical window and Technically challenging study due to limited acoustic windows. Image acquisition challenging due to COPD. IMPRESSIONS  1. Left ventricular ejection fraction, by estimation, is 60 to 65%. The left ventricle has normal function. The left ventricle demonstrates regional wall motion abnormalities (see scoring diagram/findings for description).  2. Right ventricular systolic function was not well visualized. The right ventricular size is mildly enlarged.  3. The mitral valve was not well visualized. No evidence of mitral valve regurgitation.  4. The aortic valve was not well visualized. Aortic valve regurgitation is not visualized. Conclusion(s)/Recommendation(s): POOR ACOUSTIC WINDOWS LIMITS ABILITY TO EVALUATE VALVULAR PATHOLOGY AND RV SIZE AND FUNCTION. FINDINGS  Left Ventricle: Left ventricular ejection fraction, by estimation, is 60 to 65%. The left ventricle has normal function. The left ventricle demonstrates regional wall motion abnormalities. The left ventricular internal cavity size was normal in size. There is no left ventricular hypertrophy. Right Ventricle: The right ventricular size is mildly enlarged. Right vetricular wall thickness was not well visualized. Right ventricular systolic function was not well visualized. Pericardium: There is no evidence of pericardial effusion. Mitral Valve: The mitral valve was not well visualized. No evidence of mitral valve regurgitation. Tricuspid Valve: The tricuspid valve is not well visualized. Aortic Valve: The aortic valve was not well visualized. Aortic valve regurgitation is not visualized. Pulmonic Valve: The pulmonic valve was not well  visualized. Venous: The inferior vena cava was not well visualized.  LEFT VENTRICLE PLAX 2D LVIDd:         3.80 cm LVIDs:         2.50 cm LV PW:         1.30 cm LV IVS:        1.40 cm LVOT diam:     1.90 cm LVOT Area:     2.84 cm  LEFT ATRIUM         Index LA diam:    3.70 cm 2.09 cm/m   AORTA Ao Root diam: 2.30 cm  SHUNTS Systemic Diam: 1.90 cm Donnelly Angelica Electronically signed by Donnelly Angelica Signature Date/Time: 11/11/2021/4:49:01 PM    Final         Scheduled Meds:  apixaban  5 mg Oral BID   atorvastatin  40 mg Oral QHS   bumetanide (BUMEX) IV  1 mg Intravenous Q12H   carvedilol  3.125 mg Oral BID WC   chlorhexidine  15 mL Mouth Rinse BID   diclofenac Sodium  2 g Topical TID   flecainide  50 mg Oral TID   fluticasone furoate-vilanterol  1 puff Inhalation Daily   And   umeclidinium bromide  1 puff Inhalation Daily   levothyroxine  25 mcg Oral Q0600   lisinopril  40 mg Oral Daily   loratadine  10 mg Oral Daily   mouth rinse  15 mL Mouth Rinse q12n4p   pantoprazole  40 mg Oral Daily   potassium chloride SA  40 mEq Oral BID   sertraline  100 mg Oral Daily   traZODone  50 mg Oral QHS   Continuous Infusions:   LOS: 1 day    Time spent: 33 mins     Wyvonnia Dusky, MD [facility] Pager [phone]  If 7PM-7AM, please contact night-coverage 11/12/2021, 12:18 PM

## 2021-11-12 NOTE — Consult Note (Signed)
[facility] Cardiology  CARDIOLOGY CONSULT NOTE  Patient ID: [name] MRN: [id number] DOB/AGE: 1945-10-14 75 y.o.  Admit date: 11/10/2021 Referring Physician Fabienne Bruns, MD Primary Physician Lyndon Code, MD Primary Cardiologist Paraschos Reason for Consultation ? HFpEF.  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  Patient is a tangential historian, so it is hard to get a clear history.   She describes worsening shortness of breath over the last 2 months, progressing to the point of needing to call EMS over the last few days. She had been seen in the ED on 2 separate occasions before this, and had her bumex increased and started on steroids; despite this her respiratory status worsened until Monday when she called EMS.  On EMS arrival her oxygen saturation was 82% with ambulation on her home 4 L nasal cannula.  On arrival to the emergency department she was afebrile, heart rate 70-80, blood pressure normal.  She required 6 L nasal cannula oxygen initially, but has been since weaned down to 4 L nasal cannula.  Cardiac ROS is negative for LE Edema. She says she holds fluid in her abdomen, but it is unclear if she is swelling. Her weight sounds to be stable (188 lbs). No orthopnea, PND.   Review of systems complete and found to be negative unless listed above     Past Medical History:  Diagnosis Date   Aortic 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 s/p bioprosthetic valve replacement 2008 at [facility];  b. 01/2015 Echo: EF 60-65%, no rwma, Gr1 DD, mildly dil LA, nl RV fxn.   Atrial fibrillation with RVR ([name]) 01/11/2015   Atrial flutter ([name])    a. 08/2016 s/p  DCCV.  Remains on flecainide 50 mg bid.   Basal skull fracture ([name]) 20 yrs ago   CHF (congestive heart failure) ([name])    Chronic respiratory failure ([name])    COPD (chronic obstructive pulmonary disease) ([name])    a. 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 2008 prior to Aortic aneurysm repair-->nl cors.   History of stress test    a. 10/2015 MV: no ischemia/infarct.   HLD (hyperlipidemia)    HTN (hypertension)    Hypothyroidism    Obesity    PAF (paroxysmal atrial fibrillation) ([name])    a. on Eliquis; b. CHADS2VASc = 3 (HTN, age x 1, female).   Paroxysmal atrial fibrillation ([name]) 01/24/2015   Right upper quadrant abdominal tenderness without rebound tenderness 03/02/2018   S/P ascending aortic aneurysm repair 2008    Past Surgical History:  Procedure Laterality Date   ABDOMINAL AORTIC ANEURYSM REPAIR  2008   ABDOMINAL HYSTERECTOMY     AORTIC VALVE REPLACEMENT  2008   CARDIAC CATHETERIZATION     [facility]   CARDIOVERSION N/A 08/15/2018   Procedure: CARDIOVERSION;  Surgeon: Iran Ouch, MD;  Location: [facility] ORS;  Service: Cardiovascular;  Laterality: N/A;   CARDIOVERSION N/A 11/14/2018   Procedure: CARDIOVERSION (CATH LAB);  Surgeon: Iran Ouch, MD;  Location: [facility] ORS;  Service: Cardiovascular;  Laterality: N/A;   CARDIOVERSION N/A 06/05/2019   Procedure: CARDIOVERSION;  Surgeon: Iran Ouch, MD;  Location: [facility] ORS;  Service: Cardiovascular;  Laterality: N/A;   CARDIOVERSION N/A 08/14/2019   Procedure: CARDIOVERSION;  Surgeon: Iran Ouch, MD;  Location: [facility] ORS;  Service: Cardiovascular;  Laterality: N/A;   CARDIOVERSION N/A 11/09/2019   Procedure: CARDIOVERSION;  Surgeon: Marcina Millard, MD;  Location: [facility] ORS;  Service: Cardiovascular;  Laterality: N/A;   CARDIOVERSION N/A 01/17/2020   Procedure: CARDIOVERSION;  Surgeon: Marcina Millard,  MD;  Location: [facility] ORS;  Service: Cardiovascular;  Laterality: N/A;   CARPAL TUNNEL RELEASE     ELECTROPHYSIOLOGIC STUDY N/A 08/17/2016   Procedure: Cardioversion;  Surgeon: Iran Ouch, MD;  Location: [facility] ORS;  Service: Cardiovascular;  Laterality: N/A;   TUMOR EXCISION Left    x3 (arm)    Medications Prior to Admission  Medication Sig Dispense Refill Last Dose   ALPRAZolam (XANAX) 0.25 MG tablet Take 1 tablet (0.25 mg total) by mouth 2 (two) times daily as needed for anxiety. 60 tablet 2 11/10/2021 at 1400   amLODipine (NORVASC) 10 MG tablet Take 1 tablet by mouth daily.   11/10/2021 at 0800   apixaban (ELIQUIS) 5 MG TABS tablet Take 1 tablet (5 mg total) by mouth 2 (two) times daily. 180 tablet 2 11/10/2021 at 2000   atorvastatin (LIPITOR) 40 MG tablet 1 tab po qhs 90 tablet 1 11/10/2021 at 2000   bumetanide (BUMEX) 1 MG tablet TAKE 2 TABLETS BY MOUTH TWICE DAILY 360 tablet 3 11/10/2021 at 1400   Calcium Carbonate-Vitamin D (CALCIUM 600+D PO) Take 1 tablet by mouth daily.   11/10/2021 at 0800   carvedilol (COREG) 3.125 MG tablet Take 3.125 mg by mouth 2 (two) times daily with a meal.   11/10/2021 at 1730   Coenzyme Q10 (COQ10) 200 MG CAPS Take 200 mg by mouth daily.   11/10/2021 at 0800   diclofenac Sodium (VOLTAREN) 1 % GEL SMARTSIG:Gram(s) Topical Twice Daily   11/10/2021 at 2000   ferrous sulfate 325 (65 FE) MG tablet Take 2 tablets (650 mg total) by mouth 2 (two) times daily with a meal. (Patient taking differently: Take 325 mg by mouth. Takes 4 tablets by mouth twice a day)  3 11/10/2021 at 2000   flecainide (TAMBOCOR) 50 MG tablet Take 50 mg by mouth 3 (three) times daily.   11/10/2021 at 2000   Fluticasone-Umeclidin-Vilant (TRELEGY ELLIPTA) 100-62.5-25 MCG/ACT AEPB Inhale 1 puff into the lungs daily. 60 each 6 11/10/2021 at 0800   levothyroxine (SYNTHROID) 25 MCG tablet Take 1 tablet (25 mcg total) by mouth daily before breakfast. 90 tablet 1 11/10/2021 at 0800   lisinopril (ZESTRIL) 40 MG tablet TAKE 1  TABLET(40 MG) BY MOUTH DAILY(DOSE INCREASE) 90 tablet 3 11/10/2021 at 0800   loratadine (CLARITIN) 10 MG tablet Take 10 mg by mouth daily.   11/10/2021 at 0800   oxyCODONE-acetaminophen (PERCOCET) 7.5-325 MG tablet Take one tab in am and half in pm for severe shoulder pain 45 tablet 0 11/10/2021 at 2000   pantoprazole (PROTONIX) 40 MG tablet Take 1 tablet (40 mg total) by mouth daily. 90 tablet 1 11/10/2021 at 0800   potassium chloride (KLOR-CON) 10 MEQ tablet Take 4 tablets (40 mEq total) by mouth 2 (two) times daily. 720 tablet 3 11/10/2021 at 2000   sertraline (ZOLOFT) 100 MG tablet Take one tab a day for Anxiety 90 tablet 2 11/10/2021 at 0800   sodium chloride (OCEAN) 0.65 % SOLN  nasal spray Place 1 spray into both nostrils as needed for congestion.   11/10/2021 at prn   traZODone (DESYREL) 50 MG tablet Take 1 tablet (50 mg total) by mouth at bedtime. 90 tablet 1 11/10/2021 at 2000   albuterol (VENTOLIN HFA) 108 (90 Base) MCG/ACT inhaler Inhale 2 puffs into the lungs every 6 (six) hours as needed for wheezing or shortness of breath. 18 g 3 prn at unknown   amoxicillin (AMOXIL) 500 MG capsule Take 4 cap po 1 hrs before dental procedure 20 capsule 0 prn at prn   dicyclomine (BENTYL) 10 MG capsule Take 1 capsule (10 mg total) by mouth 3 (three) times daily as needed for spasms. (Patient not taking: Reported on 11/11/2021) 30 capsule 0 Not Taking   flecainide (TAMBOCOR) 50 MG tablet Take 1 tablet (50 mg total) by mouth 2 (two) times daily. PLEASE CONTACT OFFICE [phone] TO SCHEDULE APPOINTMENT FOR FUTURE REFILLS (Patient not taking: Reported on 11/11/2021) 30 tablet 0 Not Taking   traMADol (ULTRAM) 50 MG tablet Take one tab po bid for pain 60 tablet 1 prn at unknown   Social History   Socioeconomic History   Marital status: Single    Spouse name: Not on file   Number of children: Not on file   Years of education: Not on file   Highest education level: Not on file  Occupational History   Not on file  Tobacco  Use   Smoking status: Former    Types: Cigarettes   Smokeless tobacco: Never  Vaping Use   Vaping Use: Never used  Substance and Sexual Activity   Alcohol use: No   Drug use: No   Sexual activity: Never    Birth control/protection: Surgical  Other Topics Concern   Not on file  Social History Narrative   Not on file   Social Determinants of Health   Financial Resource Strain: Low Risk    Difficulty of Paying Living Expenses: Not very hard  Food Insecurity: Not on file  Transportation Needs: Not on file  Physical Activity: Not on file  Stress: Not on file  Social Connections: Not on file  Intimate Partner Violence: Not on file    Family History  Problem Relation Age of Onset   Stroke Father    Stroke Paternal Grandmother       Review of systems complete and found to be negative unless listed above      PHYSICAL EXAM  General: Well developed, well nourished, in no acute distress HEENT:  Normocephalic and atramatic Neck:  No JVD.  Lungs: Clear bilaterally to auscultation and percussion. Heart: HRRR . 2/6 systolic murmur Abdomen: Bowel sounds are positive, abdomen soft and non-tender  Msk:  Back normal, normal gait. Normal strength and tone for age. Extremities: No clubbing, cyanosis. Mild LE edema Neuro: Alert and oriented X 3. Psych:  Good affect, responds appropriately  Labs:   Lab Results  Component Value Date   WBC 7.6 11/10/2021   HGB 10.5 (L) 11/10/2021   HCT 32.8 (L) 11/10/2021   MCV 90.9 11/10/2021   PLT 244 11/10/2021    Recent Labs  Lab 11/10/21 2327  NA 134*  K 4.0  CL 99  CO2 26  BUN 12  CREATININE 0.90  CALCIUM 9.0  GLUCOSE 122*   Lab Results  Component Value Date   CKTOTAL 107 08/14/2012   CKMB 0.7 08/14/2012   TROPONINI <0.03 10/31/2018    Lab Results  Component Value Date   CHOL  142 11/25/2020   CHOL 127 07/17/2019   Lab Results  Component Value Date   HDL 54 11/25/2020   HDL 53 07/17/2019   Lab Results  Component  Value Date   LDLCALC 59 11/25/2020   LDLCALC 56 07/17/2019   Lab Results  Component Value Date   TRIG 174 (H) 11/25/2020   TRIG 94 07/17/2019   No results found for: CHOLHDL No results found for: LDLDIRECT    Radiology: DG Chest 2 View  Result Date: 11/11/2021 CLINICAL DATA:  Shortness of breath EXAM: CHEST - 2 VIEW COMPARISON:  10/27/2021, 03/17/2021 FINDINGS: Post sternotomy changes. Left-sided pacing device as before. Mild cardiomegaly. Mild diffuse reticular interstitial opacity likely due to chronic change. Subsegmental atelectasis or scar at the bases. No pleural effusion or confluent airspace disease. Aortic atherosclerosis. IMPRESSION: No active cardiopulmonary disease. Mild cardiomegaly. Chronic interstitial opacities. Electronically Signed   By: Jasmine Pang M.D.   On: 11/11/2021 00:18   DG Chest 2 View  Result Date: 10/27/2021 CLINICAL DATA:  Shortness of breath. EXAM: CHEST - 2 VIEW COMPARISON:  10/23/2021 and CT chest 05/11/2020. FINDINGS: Patient is rotated. Trachea is midline. Heart is enlarged, stable. Thoracic aorta is calcified. Pacemaker lead tips are seen in the right atrium and right ventricle. Image quality on the frontal view is degraded by technique. Mild accentuation of the pulmonary markings, unchanged. No airspace consolidation or pleural fluid. IMPRESSION: Mild accentuation of the pulmonary markings may be chronic. No airspace consolidation. Electronically Signed   By: Leanna Battles M.D.   On: 10/27/2021 13:09   DG Chest 2 View  Result Date: 10/23/2021 CLINICAL DATA:  Worsening shortness of breath over the last week. EXAM: CHEST - 2 VIEW COMPARISON:  03/17/2021 FINDINGS: Dual lead pacer noted. Aortic valve prosthesis observed with evidence of prior median sternotomy. Atherosclerotic calcification of the aortic arch. Stable mild to moderate enlargement of the cardiopericardial silhouette. Fine reticular interstitial accentuation in the lungs without well-defined  Kerley B lines. Mildly indistinct pulmonary vasculature. Right axillary clips noted. No blunting of the costophrenic angles. Thoracic spondylosis noted. Degenerative glenohumeral arthropathy bilaterally. IMPRESSION: 1. Mild indistinctness of the pulmonary vasculature which may be from pulmonary venous hypertension. There is also some fine reticular interstitial accentuation in the lungs but without overt Kerley B lines to further indicate interstitial pulmonary edema. 2. Mild to moderate enlargement of the cardiopericardial silhouette. Dual lead pacer and aortic valve prosthesis noted. 3.  Aortic Atherosclerosis (ICD10-I70.0). Electronically Signed   By: Gaylyn Rong M.D.   On: 10/23/2021 17:29   ECHOCARDIOGRAM COMPLETE  Result Date: 11/11/2021    ECHOCARDIOGRAM REPORT   Patient Name:   [name] Date of Exam: 11/11/2021 Medical Rec #:  [id number]     Height:       58.0 in Accession #:    [id number]    Weight:       187.6 lb Date of Birth:  November 24, 1945    BSA:          1.772 m Patient Age:    75 years      BP:           106/59 mmHg Patient Gender: F             HR:           70 bpm. Exam Location:  [facility] Procedure: 2D Echo, Cardiac Doppler and Color Doppler Indications:     Syncope R55  History:         Patient  has prior history of Echocardiogram examinations, most                  recent 08/12/2017. Arrythmias:Atrial Fibrillation; Risk                  Factors:Hypertension. 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  Sonographer:     Cristela Blue Referring Phys:  [id number] Andris Baumann Diagnosing Phys: Sena Slate  Sonographer Comments: No apical window and Technically challenging study due to limited acoustic windows. Image acquisition challenging due to COPD. IMPRESSIONS  1. Left ventricular ejection fraction, by estimation, is 60 to 65%. The left ventricle has normal function. The left ventricle demonstrates regional wall motion abnormalities (see scoring diagram/findings for  description).  2. Right ventricular systolic function was not well visualized. The right ventricular size is mildly enlarged.  3. The mitral valve was not well visualized. No evidence of mitral valve regurgitation.  4. The aortic valve was not well visualized. Aortic valve regurgitation is not visualized. Conclusion(s)/Recommendation(s): POOR ACOUSTIC WINDOWS LIMITS ABILITY TO EVALUATE VALVULAR PATHOLOGY AND RV SIZE AND FUNCTION. FINDINGS  Left Ventricle: Left ventricular ejection fraction, by estimation, is 60 to 65%. The left ventricle has normal function. The left ventricle demonstrates regional wall motion abnormalities. The left ventricular internal cavity size was normal in size. There is no left ventricular hypertrophy. Right Ventricle: The right ventricular size is mildly enlarged. Right vetricular wall thickness was not well visualized. Right ventricular systolic function was not well visualized. Pericardium: There is no evidence of pericardial effusion. Mitral Valve: The mitral valve was not well visualized. No evidence of mitral valve regurgitation. Tricuspid Valve: The tricuspid valve is not well visualized. Aortic Valve: The aortic valve was not well visualized. Aortic valve regurgitation is not visualized. Pulmonic Valve: The pulmonic valve was not well visualized. Venous: The inferior vena cava was not well visualized.  LEFT VENTRICLE PLAX 2D LVIDd:         3.80 cm LVIDs:         2.50 cm LV PW:         1.30 cm LV IVS:        1.40 cm LVOT diam:     1.90 cm LVOT Area:     2.84 cm  LEFT ATRIUM         Index LA diam:    3.70 cm 2.09 cm/m   AORTA Ao Root diam: 2.30 cm  SHUNTS Systemic Diam: 1.90 cm Sena Slate Electronically signed by Sena Slate Signature Date/Time: 11/11/2021/4:49:01 PM    Final     EKG: AV dual paced rhythm.  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  #HFpEF #Acute on chronic hypoxemic respiratory failure #Morbid obesity Patient is admitted with few days of worsening shortness of breath.  Interestingly her BNP is 170 which is lower than it has been in the past.  She has improved to some degree with an increase in IV Bumex to 1 mg twice daily. - Please check BMP at least daily while diuresing. Placed order for today - Continue bumex 1 mg BID. Has some mild improvement on this.  May need to increase, but will assess renal function first.   - Home dose 2 mg PO BID - Continue home BP management.   #Paroxysmal atrial fibrillation Patient underwent a AV node ablation in June 2021 and is now pacemaker dependent. -Continue Eliquis 5 mg twice daily -Continue flecainide 50 mg twice daily -Continue Coreg 3.125 mg twice daily  # Hypertension - Continue lisinopril 40 mg daily  Signed: Armando Reichert MD 11/12/2021, 1:10 PM

## 2021-11-12 NOTE — Plan of Care (Signed)

## 2021-11-12 NOTE — Telephone Encounter (Signed)
Good Morning! Can you please add an order for CT chest wo contrast for [name]? She currently has an outpatient order in her chart, however I was informed that this order can be performed if the ER doctor or Hospitalist puts it in. Please contact DR. FOZIA kHAN @ [phone] IF YOU SHOULD HAVE ANY ADDITIONAL QUESTIONS OR CONCERNS.  Thank You. ? ?*Conversation with Dr. Clayborn Bigness and Dr. Jennye Boroughs. ? ?

## 2021-11-13 LAB — BASIC METABOLIC PANEL
Anion gap: 12 (ref 5–15)
BUN: 23 mg/dL (ref 8–23)
CO2: 25 mmol/L (ref 22–32)
Calcium: 9.5 mg/dL (ref 8.9–10.3)
Chloride: 102 mmol/L (ref 98–111)
Creatinine, Ser: 1.01 mg/dL — ABNORMAL HIGH (ref 0.44–1.00)
GFR, Estimated: 58 mL/min — ABNORMAL LOW (ref >60)
Glucose, Bld: 99 mg/dL (ref 70–99)
Potassium: 4.2 mmol/L (ref 3.5–5.1)
Sodium: 139 mmol/L (ref 135–145)

## 2021-11-13 LAB — CBC
HCT: 33.6 % — ABNORMAL LOW (ref 36.0–46.0)
Hemoglobin: 10.7 g/dL — ABNORMAL LOW (ref 12.0–15.0)
MCH: 28.8 pg (ref 26.0–34.0)
MCHC: 31.8 g/dL (ref 30.0–36.0)
MCV: 90.6 fL (ref 80.0–100.0)
Platelets: 283 10*3/uL (ref 150–400)
RBC: 3.71 MIL/uL — ABNORMAL LOW (ref 3.87–5.11)
RDW: 14.6 % (ref 11.5–15.5)
WBC: 7.1 10*3/uL (ref 4.0–10.5)
nRBC: 0 % (ref 0.0–0.2)

## 2021-11-13 MED ORDER — POLYETHYLENE GLYCOL 3350 17 G PO PACK
17.0000 g | PACK | Freq: Every day | ORAL | Status: DC
  Administered 2021-11-13 – 2021-11-14 (×2): 17 g via ORAL
  Filled 2021-11-13 (×2): qty 1

## 2021-11-13 MED ORDER — DOCUSATE SODIUM 100 MG PO CAPS
200.0000 mg | ORAL_CAPSULE | Freq: Two times a day (BID) | ORAL | Status: DC
  Administered 2021-11-13 – 2021-11-14 (×3): 200 mg via ORAL
  Filled 2021-11-13 (×3): qty 2

## 2021-11-13 MED ORDER — OXYCODONE-ACETAMINOPHEN 5-325 MG PO TABS
1.5000 | ORAL_TABLET | Freq: Three times a day (TID) | ORAL | Status: DC | PRN
  Administered 2021-11-14: 1.5 via ORAL
  Filled 2021-11-13: qty 2

## 2021-11-13 NOTE — TOC Progression Note (Addendum)
Transition of Care (TOC) - Progression Note  ? ? ?Patient Details  ?Name: [name] ?MRN: [id number] ?Date of Birth: 05/23/1946 ? ?Transition of Care (TOC) CM/SW Contact  ?Candie Chroman, LCSW ?Phone Number: ?11/13/2021, 9:32 AM ? ?Clinical Narrative:   Per heart failure nurse navigator, patient requesting a home health aide. Met with patient and confirmed. She is also agreeable to a nurse. No agency preference. She worked with [employer] last year so they are reviewing referral. RN won't be available until middle of next week but aide could likely start later this week or over the weekend. ? ?10:52 am: [name] has accepted referral. ? ?Expected Discharge Plan: [place] ?Barriers to Discharge: Continued Medical Work up ? ?Expected Discharge Plan and Services ?Expected Discharge Plan: [place] ?  ?Discharge Planning Services: CM Consult ?  ?Living arrangements for the past 2 months: Apartment ?                ?DME Arranged: N/A ?DME Agency: NA ?  ?  ?  ?  ?  ?  ?  ?  ? ? ?Social Determinants of Health (SDOH) Interventions ?  ? ?Readmission Risk Interventions ?No flowsheet data found. ? ?

## 2021-11-13 NOTE — Evaluation (Signed)
Physical Therapy Evaluation ?Patient Details ?Name: [name] ?MRN: [id number] ?DOB: 03-09-1946 ?Today's Date: 11/13/2021 ? ?History of Present Illness ? [name] is a 76 y.o. female with medical history significant for severe aortic stenosis status post bioprosthetic AV, chronic respiratory failure on 3 L of oxygen continuous, paroxysmal A. fib status post multiple cardioversions on flecainide and on chronic anticoagulation with Eliquis.  She presents to the ER for evaluation of shortness of breath; admitted for management of acute/chronic respiratory failure due to CHF exacerbation.  ?Clinical Impression ? Patient seated in recliner beginning of session; alert and oriented to all information, agreeable to participation with session.  Denies acute pain; does endorse chronic discomfort to bilat shoulders (chronic arthritic changes), unchanged with this admission.  Able to complete sit/stand, basic transfers and gait (45' x2) without assist device, sup/mod indep.  Demonstrates reciprocal stepping pattern with decreased step height/length; decreased cadence.  Limited balance reactions, intermittently reaching for furniture for external stabilization, but no overt buckling or LOB.   Does desat to 85% on 4L, requiring increase to 6L with activity to maintains sats >90% (returned to 4L at rest).  RN/MD informed/aware. ?Would benefit from skilled PT to address above deficits and promote optimal return to PLOF.; Recommend transition to HHPT upon discharge from acute hospitalization. ? ?   ?   ? ?Recommendations for follow up therapy are one component of a multi-disciplinary discharge planning process, led by the attending physician.  Recommendations may be updated based on patient status, additional functional criteria and insurance authorization. ? ?Follow Up Recommendations Home health PT ? ?  ?Assistance Recommended at Discharge PRN  ?Patient can return home with the following ?   ? ?  ?Equipment Recommendations     ?Recommendations for Other Services ?    ?  ?Functional Status Assessment Patient has had a recent decline in their functional status and demonstrates the ability to make significant improvements in function in a reasonable and predictable amount of time.  ? ?  ?Precautions / Restrictions Precautions ?Precautions: None ?Restrictions ?Weight Bearing Restrictions: No  ? ?  ? ?Mobility ? Bed Mobility ?  ?  ?  ?  ?  ?  ?  ?General bed mobility comments: seated in recliner beginning/end of treatment session ?  ? ?Transfers ?Overall transfer level: Modified independent ?Equipment used: None ?  ?  ?  ?  ?  ?  ?  ?General transfer comment: O2 and increased time ?  ? ?Ambulation/Gait ?Ambulation/Gait assistance: Supervision ?Gait Distance (Feet):  (45' x2) ?Assistive device: None ?  ?  ?  ?  ?General Gait Details: reciprocal stepping pattern with decreased step height/length; decreased cadence.  Limited balance reactions, intermittently reaching for furniture for external stabilization, but no overt buckling or LOB.   Does desat to 85% on 4L, requiring increase to 6L with activity to maintains sats >90% (returned to 4L at rest).  RN/MD informed/aware. ? ?Stairs ?  ?  ?  ?  ?  ? ?Wheelchair Mobility ?  ? ?Modified Rankin (Stroke Patients Only) ?  ? ?  ? ?Balance Overall balance assessment: Needs assistance ?Sitting-balance support: No upper extremity supported, Feet supported ?Sitting balance-Leahy Scale: Good ?  ?  ?Standing balance support: No upper extremity supported ?Standing balance-Leahy Scale: Fair ?  ?  ?  ?  ?  ?  ?  ?  ?  ?  ?  ?  ?   ? ? ? ?Pertinent Vitals/Pain Pain Assessment ?Pain Assessment:  No/denies pain  ? ? ?Home Living Family/patient expects to be discharged to:: Private residence ?Living Arrangements: Other relatives ?Available Help at Discharge: Family;Available PRN/intermittently ?Type of Home: House ?Home Access: Level entry ?  ?  ?  ?  ?  ?   ?  ?Prior Function Prior Level of Function :  Independent/Modified Independent ?  ?  ?  ?  ?  ?  ?Mobility Comments: Indep with ADLs, household mobilization (minimal out of house mobility) without assist device; 4L O2 at baseline (recently increased from 3L 2-3 weeks prior to this admission).  Denies fall history. ?  ?  ? ? ?Hand Dominance  ? Dominant Hand: Right ? ?  ?Extremity/Trunk Assessment  ? Upper Extremity Assessment ?Upper Extremity Assessment:  (significant limitations to bilat shoulders, L > R, due to chronic arthritic changes.  Otherwise, elbows, wrists and hands grossly WFL) ?LUE Deficits / Details: limited shoulder AROM ~80*, reports baseline 2/2 arthritis ?  ? ?Lower Extremity Assessment ?Lower Extremity Assessment: Overall WFL for tasks assessed (grossly at least 4/5 throughout) ?  ? ?   ?Communication  ? Communication: HOH  ?Cognition Arousal/Alertness: Awake/alert ?Behavior During Therapy: [facility] for tasks assessed/performed ?Overall Cognitive Status: Within Functional Limits for tasks assessed ?  ?  ?  ?  ?  ?  ?  ?  ?  ?  ?  ?  ?  ?  ?  ?  ?  ?  ?  ? ?  ?General Comments   ? ?  ?Exercises Other Exercises ?Other Exercises: Reviewed pursed lip breathing, activity pacing and energy conservation strategies; patient familiar with education from previous experiences.  ? ?Assessment/Plan  ?  ?PT Assessment Patient needs continued PT services  ?PT Problem List Decreased strength;Decreased range of motion;Decreased activity tolerance;Decreased balance;Decreased mobility;Decreased knowledge of use of DME;Decreased safety awareness;Decreased knowledge of precautions;Cardiopulmonary status limiting activity ? ?   ?  ?PT Treatment Interventions Gait training;Functional mobility training;Therapeutic activities;Therapeutic exercise;Balance training;Patient/family education   ? ?PT Goals (Current goals can be found in the Care Plan section)  ?Acute Rehab PT Goals ?Patient Stated Goal: to return home ?PT Goal Formulation: With patient ?Time For Goal  Achievement: 11/27/21 ?Potential to Achieve Goals: Good ? ?  ?Frequency Min 2X/week ?  ? ? ?Co-evaluation   ?  ?  ?  ?  ? ? ?  ?AM-PAC PT "6 Clicks" Mobility  ?Outcome Measure Help needed turning from your back to your side while in a flat bed without using bedrails?: None ?Help needed moving from lying on your back to sitting on the side of a flat bed without using bedrails?: None ?Help needed moving to and from a bed to a chair (including a wheelchair)?: None ?Help needed standing up from a chair using your arms (e.g., wheelchair or bedside chair)?: None ?Help needed to walk in hospital room?: None ?Help needed climbing 3-5 steps with a railing? : A Little ?6 Click Score: 23 ? ?  ?End of Session Equipment Utilized During Treatment: Gait belt ?Activity Tolerance: Patient tolerated treatment well ?Patient left: in chair;with call bell/phone within reach ?Nurse Communication: Mobility status ?PT Visit Diagnosis: Muscle weakness (generalized) (M62.81);Difficulty in walking, not elsewhere classified (R26.2) ?  ? ?Time: 3500-9381 ?PT Time Calculation (min) (ACUTE ONLY): 14 min ? ? ?Charges:   PT Evaluation ?$PT Eval Moderate Complexity: 1 Mod ?  ?  ?   ? ?Ernestene Coover H. Owens Shark, PT, DPT, NCS ?11/13/21, 5:13 PM ?[id number] ? ? ?

## 2021-11-13 NOTE — Plan of Care (Signed)
  Problem: Education: Goal: Ability to demonstrate management of disease process will improve Outcome: Progressing Goal: Ability to verbalize understanding of medication therapies will improve Outcome: Progressing   Problem: Activity: Goal: Capacity to carry out activities will improve Outcome: Progressing   

## 2021-11-13 NOTE — Progress Notes (Signed)
[facility] Cardiology  CARDIOLOGY CONSULT NOTE  Patient ID: [name] MRN: [id number] DOB/AGE: 10/28/1945 76 y.o.  Admit date: 11/10/2021 Referring Physician Eppie Gibson, MD Primary Physician [name], MD Primary Cardiologist Paraschos Reason for Consultation ? HFpEF.  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  Interval history: - Doing ok today. She says her breathing is improved but not quite to her baseline - Major complaint is constipation.   Review of systems complete and found to be negative unless listed above     Past Medical History:  Diagnosis Date   Aortic 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 s/p bioprosthetic valve replacement 2008 at [facility];  b. 01/2015 Echo: EF 60-65%, no rwma, Gr1 DD, mildly dil LA, nl RV fxn.   Atrial fibrillation with RVR ([place]) 01/11/2015   Atrial flutter ([place])    a. 08/2016 s/p DCCV.  Remains on flecainide 50 mg bid.   Basal skull fracture ([name]) 20 yrs ago   CHF (congestive heart failure) ([name])    Chronic respiratory failure ([name])    COPD (chronic obstructive pulmonary disease) ([place])    a. 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 2008 prior to Aortic aneurysm repair-->nl cors.   History of stress test    a. 10/2015 MV: no ischemia/infarct.   HLD (hyperlipidemia)    HTN (hypertension)    Hypothyroidism    Obesity    PAF (paroxysmal atrial fibrillation) ([name])    a. on Eliquis; b. CHADS2VASc = 3 (HTN, age x 80, female).   Paroxysmal atrial fibrillation ([place]) 01/24/2015   Right upper quadrant  abdominal tenderness without rebound tenderness 03/02/2018   S/P ascending aortic aneurysm repair 2008    Past Surgical History:  Procedure Laterality Date   ABDOMINAL AORTIC ANEURYSM REPAIR  2008   ABDOMINAL HYSTERECTOMY     AORTIC VALVE REPLACEMENT  2008   CARDIAC CATHETERIZATION     [facility]   CARDIOVERSION N/A 08/15/2018   Procedure: CARDIOVERSION;  Surgeon: [name], MD;  Location: [facility] ORS;  Service: Cardiovascular;  Laterality: N/A;   CARDIOVERSION N/A 11/14/2018   Procedure: CARDIOVERSION (CATH LAB);  Surgeon: [name], MD;  Location: [facility] ORS;  Service: Cardiovascular;  Laterality: N/A;   CARDIOVERSION N/A 06/05/2019   Procedure: CARDIOVERSION;  Surgeon: [name], MD;  Location: [facility] ORS;  Service: Cardiovascular;  Laterality: N/A;   CARDIOVERSION N/A 08/14/2019   Procedure: CARDIOVERSION;  Surgeon: [name], MD;  Location: [place] ORS;  Service: Cardiovascular;  Laterality: N/A;   CARDIOVERSION N/A 11/09/2019   Procedure: CARDIOVERSION;  Surgeon: Isaias Cowman, MD;  Location: [place] ORS;  Service: Cardiovascular;  Laterality: N/A;   CARDIOVERSION N/A 01/17/2020   Procedure: CARDIOVERSION;  Surgeon: Isaias Cowman, MD;  Location: [facility] ORS;  Service: Cardiovascular;  Laterality: N/A;   CARPAL TUNNEL RELEASE     ELECTROPHYSIOLOGIC STUDY N/A 08/17/2016   Procedure: Cardioversion;  Surgeon: [name], MD;  Location: [facility] ORS;  Service: Cardiovascular;  Laterality: N/A;   TUMOR EXCISION Left  x3 (arm)    Medications Prior to Admission  Medication Sig Dispense Refill Last Dose   ALPRAZolam (XANAX) 0.25 MG tablet Take 1 tablet (0.25 mg total) by mouth 2 (two) times daily as needed for anxiety. 60 tablet 2 11/10/2021 at 1400   amLODipine (NORVASC) 10 MG tablet Take 1 tablet by mouth daily.   11/10/2021 at 0800   apixaban (ELIQUIS) 5 MG TABS tablet Take 1 tablet (5 mg total) by mouth 2 (two) times daily. 180 tablet 2 11/10/2021 at 2000   atorvastatin  (LIPITOR) 40 MG tablet 1 tab po qhs 90 tablet 1 11/10/2021 at 2000   bumetanide (BUMEX) 1 MG tablet TAKE 2 TABLETS BY MOUTH TWICE DAILY 360 tablet 3 11/10/2021 at 1400   Calcium Carbonate-Vitamin D (CALCIUM 600+D PO) Take 1 tablet by mouth daily.   11/10/2021 at 0800   carvedilol (COREG) 3.125 MG tablet Take 3.125 mg by mouth 2 (two) times daily with a meal.   11/10/2021 at 1730   Coenzyme Q10 (COQ10) 200 MG CAPS Take 200 mg by mouth daily.   11/10/2021 at 0800   diclofenac Sodium (VOLTAREN) 1 % GEL SMARTSIG:Gram(s) Topical Twice Daily   11/10/2021 at 2000   ferrous sulfate 325 (65 FE) MG tablet Take 2 tablets (650 mg total) by mouth 2 (two) times daily with a meal. (Patient taking differently: Take 325 mg by mouth. Takes 4 tablets by mouth twice a day)  3 11/10/2021 at 2000   flecainide (TAMBOCOR) 50 MG tablet Take 50 mg by mouth 3 (three) times daily.   11/10/2021 at 2000   Fluticasone-Umeclidin-Vilant (TRELEGY ELLIPTA) 100-62.5-25 MCG/ACT AEPB Inhale 1 puff into the lungs daily. 60 each 6 11/10/2021 at 0800   levothyroxine (SYNTHROID) 25 MCG tablet Take 1 tablet (25 mcg total) by mouth daily before breakfast. 90 tablet 1 11/10/2021 at 0800   lisinopril (ZESTRIL) 40 MG tablet TAKE 1 TABLET(40 MG) BY MOUTH DAILY(DOSE INCREASE) 90 tablet 3 11/10/2021 at 0800   loratadine (CLARITIN) 10 MG tablet Take 10 mg by mouth daily.   11/10/2021 at 0800   oxyCODONE-acetaminophen (PERCOCET) 7.5-325 MG tablet Take one tab in am and half in pm for severe shoulder pain 45 tablet 0 11/10/2021 at 2000   pantoprazole (PROTONIX) 40 MG tablet Take 1 tablet (40 mg total) by mouth daily. 90 tablet 1 11/10/2021 at 0800   potassium chloride (KLOR-CON) 10 MEQ tablet Take 4 tablets (40 mEq total) by mouth 2 (two) times daily. 720 tablet 3 11/10/2021 at 2000   sertraline (ZOLOFT) 100 MG tablet Take one tab a day for Anxiety 90 tablet 2 11/10/2021 at 0800   sodium chloride (OCEAN) 0.65 % SOLN nasal spray Place 1 spray into both nostrils as needed for  congestion.   11/10/2021 at prn   traZODone (DESYREL) 50 MG tablet Take 1 tablet (50 mg total) by mouth at bedtime. 90 tablet 1 11/10/2021 at 2000   albuterol (VENTOLIN HFA) 108 (90 Base) MCG/ACT inhaler Inhale 2 puffs into the lungs every 6 (six) hours as needed for wheezing or shortness of breath. 18 g 3 prn at unknown   amoxicillin (AMOXIL) 500 MG capsule Take 4 cap po 1 hrs before dental procedure 20 capsule 0 prn at prn   dicyclomine (BENTYL) 10 MG capsule Take 1 capsule (10 mg total) by mouth 3 (three) times daily as needed for spasms. (Patient not taking: Reported on 11/11/2021) 30 capsule 0 Not Taking   flecainide (TAMBOCOR) 50 MG tablet Take 1 tablet (50  mg total) by mouth 2 (two) times daily. PLEASE CONTACT OFFICE [phone] TO SCHEDULE APPOINTMENT FOR FUTURE REFILLS (Patient not taking: Reported on 11/11/2021) 30 tablet 0 Not Taking   traMADol (ULTRAM) 50 MG tablet Take one tab po bid for pain 60 tablet 1 prn at unknown   Social History   Socioeconomic History   Marital status: Single    Spouse name: Not on file   Number of children: Not on file   Years of education: Not on file   Highest education level: Not on file  Occupational History   Not on file  Tobacco Use   Smoking status: Former    Types: Cigarettes   Smokeless tobacco: Never  Vaping Use   Vaping Use: Never used  Substance and Sexual Activity   Alcohol use: No   Drug use: No   Sexual activity: Never    Birth control/protection: Surgical  Other Topics Concern   Not on file  Social History Narrative   Not on file   Social Determinants of Health   Financial Resource Strain: Low Risk    Difficulty of Paying Living Expenses: Not very hard  Food Insecurity: Not on file  Transportation Needs: Not on file  Physical Activity: Not on file  Stress: Not on file  Social Connections: Not on file  Intimate Partner Violence: Not on file    Family History  Problem Relation Age of Onset   Stroke Father    Stroke Paternal  Grandmother       Review of systems complete and found to be negative unless listed above      PHYSICAL EXAM  General: Well developed, well nourished, in no acute distress HEENT:  Normocephalic and atramatic Neck:  No JVD.  Lungs: Clear bilaterally to auscultation and percussion. Heart: HRRR . 2/6 systolic murmur Abdomen: Bowel sounds are positive, abdomen soft and non-tender  Msk:  Back normal, normal gait. Normal strength and tone for age. Extremities: No clubbing, cyanosis. Mild LE edema Neuro: Alert and oriented X 3. Psych:  Good affect, responds appropriately  Labs:   Lab Results  Component Value Date   WBC 7.1 11/13/2021   HGB 10.7 (L) 11/13/2021   HCT 33.6 (L) 11/13/2021   MCV 90.6 11/13/2021   PLT 283 11/13/2021    Recent Labs  Lab 11/13/21 0424  NA 139  K 4.2  CL 102  CO2 25  BUN 23  CREATININE 1.01*  CALCIUM 9.5  GLUCOSE 99    Lab Results  Component Value Date   CKTOTAL 107 08/14/2012   CKMB 0.7 08/14/2012   TROPONINI <0.03 10/31/2018     Lab Results  Component Value Date   CHOL 142 11/25/2020   CHOL 127 07/17/2019   Lab Results  Component Value Date   HDL 54 11/25/2020   HDL 53 07/17/2019   Lab Results  Component Value Date   LDLCALC 59 11/25/2020   LDLCALC 56 07/17/2019   Lab Results  Component Value Date   TRIG 174 (H) 11/25/2020   TRIG 94 07/17/2019   No results found for: CHOLHDL No results found for: LDLDIRECT    Radiology: DG Chest 2 View  Result Date: 11/11/2021 CLINICAL DATA:  Shortness of breath EXAM: CHEST - 2 VIEW COMPARISON:  10/27/2021, 03/17/2021 FINDINGS: Post sternotomy changes. Left-sided pacing device as before. Mild cardiomegaly. Mild diffuse reticular interstitial opacity likely due to chronic change. Subsegmental atelectasis or scar at the bases. No pleural effusion or confluent airspace disease. Aortic atherosclerosis. IMPRESSION:  No active cardiopulmonary disease. Mild cardiomegaly. Chronic interstitial  opacities. Electronically Signed   By: Donavan Foil M.D.   On: 11/11/2021 00:18   DG Chest 2 View  Result Date: 10/27/2021 CLINICAL DATA:  Shortness of breath. EXAM: CHEST - 2 VIEW COMPARISON:  10/23/2021 and CT chest 05/11/2020. FINDINGS: Patient is rotated. Trachea is midline. Heart is enlarged, stable. Thoracic aorta is calcified. Pacemaker lead tips are seen in the right atrium and right ventricle. Image quality on the frontal view is degraded by technique. Mild accentuation of the pulmonary markings, unchanged. No airspace consolidation or pleural fluid. IMPRESSION: Mild accentuation of the pulmonary markings may be chronic. No airspace consolidation. Electronically Signed   By: Lorin Picket M.D.   On: 10/27/2021 13:09   DG Chest 2 View  Result Date: 10/23/2021 CLINICAL DATA:  Worsening shortness of breath over the last week. EXAM: CHEST - 2 VIEW COMPARISON:  03/17/2021 FINDINGS: Dual lead pacer noted. Aortic valve prosthesis observed with evidence of prior median sternotomy. Atherosclerotic calcification of the aortic arch. Stable mild to moderate enlargement of the cardiopericardial silhouette. Fine reticular interstitial accentuation in the lungs without well-defined Kerley B lines. Mildly indistinct pulmonary vasculature. Right axillary clips noted. No blunting of the costophrenic angles. Thoracic spondylosis noted. Degenerative glenohumeral arthropathy bilaterally. IMPRESSION: 1. Mild indistinctness of the pulmonary vasculature which may be from pulmonary venous hypertension. There is also some fine reticular interstitial accentuation in the lungs but without overt Kerley B lines to further indicate interstitial pulmonary edema. 2. Mild to moderate enlargement of the cardiopericardial silhouette. Dual lead pacer and aortic valve prosthesis noted. 3.  Aortic Atherosclerosis (ICD10-I70.0). Electronically Signed   By: Van Clines M.D.   On: 10/23/2021 17:29   ECHOCARDIOGRAM  COMPLETE  Result Date: 11/11/2021    ECHOCARDIOGRAM REPORT   Patient Name:   [name] Date of Exam: 11/11/2021 Medical Rec #:  [id number]     Height:       58.0 in Accession #:    [id number]    Weight:       187.6 lb Date of Birth:  1945-09-12    BSA:          1.772 m Patient Age:    86 years      BP:           106/59 mmHg Patient Gender: F             HR:           70 bpm. Exam Location:  [facility] Procedure: 2D Echo, Cardiac Doppler and Color Doppler Indications:     Syncope R55  History:         Patient has prior history of Echocardiogram examinations, most                  recent 08/12/2017. Arrythmias:Atrial Fibrillation; Risk                  Factors:Hypertension. 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  Sonographer:     Sherrie Sport Referring Phys:  [id number] Athena Masse Diagnosing Phys: Donnelly Angelica  Sonographer Comments: No apical window and Technically challenging study due to limited acoustic windows. Image acquisition challenging due to COPD. IMPRESSIONS  1. Left ventricular ejection fraction, by estimation, is 60 to 65%. The left ventricle has normal function. The left ventricle demonstrates regional wall motion abnormalities (see  scoring diagram/findings for description).  2. Right ventricular systolic function was not well visualized. The right ventricular size is mildly enlarged.  3. The mitral valve was not well visualized. No evidence of mitral valve regurgitation.  4. The aortic valve was not well visualized. Aortic valve regurgitation is not visualized. Conclusion(s)/Recommendation(s): POOR ACOUSTIC WINDOWS LIMITS ABILITY TO EVALUATE VALVULAR PATHOLOGY AND RV SIZE AND FUNCTION. FINDINGS  Left Ventricle: Left ventricular ejection fraction, by estimation, is 60 to 65%. The left ventricle has normal function. The left ventricle demonstrates regional wall motion abnormalities. The left ventricular internal cavity size was normal in size. There is no left ventricular hypertrophy.  Right Ventricle: The right ventricular size is mildly enlarged. Right vetricular wall thickness was not well visualized. Right ventricular systolic function was not well visualized. Pericardium: There is no evidence of pericardial effusion. Mitral Valve: The mitral valve was not well visualized. No evidence of mitral valve regurgitation. Tricuspid Valve: The tricuspid valve is not well visualized. Aortic Valve: The aortic valve was not well visualized. Aortic valve regurgitation is not visualized. Pulmonic Valve: The pulmonic valve was not well visualized. Venous: The inferior vena cava was not well visualized.  LEFT VENTRICLE PLAX 2D LVIDd:         3.80 cm LVIDs:         2.50 cm LV PW:         1.30 cm LV IVS:        1.40 cm LVOT diam:     1.90 cm LVOT Area:     2.84 cm  LEFT ATRIUM         Index LA diam:    3.70 cm 2.09 cm/m   AORTA Ao Root diam: 2.30 cm  SHUNTS Systemic Diam: 1.90 cm Donnelly Angelica Electronically signed by Donnelly Angelica Signature Date/Time: 11/11/2021/4:49:01 PM    Final     EKG: AV dual paced rhythm.  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  #HFpEF #Acute on chronic hypoxemic respiratory failure #Morbid obesity Patient is admitted with few days of worsening shortness of breath.  Interestingly her BNP is 170 which is lower than it has been in the past.  She has improved to some degree with an increase in IV Bumex to 1 mg twice daily. - Please check BMP at least daily while diuresing. - Continue IV bumex 1 mg BID for one more day, then transition back to home dose tomorrow.  - Home dose 2 mg PO BID - Consider addition of spironolactone - Continue home BP management.    #Paroxysmal atrial fibrillation Patient underwent a AV node ablation in June 2021 and is now pacemaker dependent. -Continue Eliquis 5 mg twice daily -Continue flecainide 50 mg twice daily -Continue Coreg 3.125 mg twice daily  # Hypertension - Continue lisinopril 40 mg daily  Signed: Andrez Grime MD 11/13/2021, 8:36 AM

## 2021-11-13 NOTE — Progress Notes (Signed)
PROGRESS NOTE    Andrea BOUCHIE  EHM:[id number] DOB: 1945-10-12 DOA: 11/10/2021 PCP: [name], MD   Assessment & Plan:   Principal Problem:   Acute on chronic diastolic CHF (congestive heart failure) ([place]) Active Problems:   Acute on chronic respiratory failure with hypoxia ([name])   Essential hypertension   Paroxysmal atrial fibrillation ([name])   History of aortic valve replacement with bioprosthetic valve   Cardiac pacemaker in situ  Acute on chronic diastolic CHF: slowly improving.  Continue on IV bumex x 1 day more, & continue on coreg, lisinopril as per cardio. Monitor I/Os and daily weights. Echo shows EF 60-65%, LV demonstrates regional wall motion abnormalities. Cardio following and recs apprec    Acute on chronic hypoxic respiratory failure: likely secondary to CHF exacerbation. Continue on supplemental oxygen and wean  back to baseline as tolerated, currently on 4 L [place]   Hx of pacemaker: check ordered in ED    Hx of aortic valve replacement: w/ bioprosthetic valve.   PAF: continue on flecainide, coreg & eliquis    HTN: continue on amlodipine, coreg   Normocytic anemia: H&H are stable. No need for a transfusion currently      DVT prophylaxis: eliquis  Code Status: full  Family Communication:  Disposition Plan: likely d/c back home   Level of care: Progressive  Status is: Inpatient Remains inpatient appropriate because: still requiring IV bumex and cardio was consulted   Consultants:  Cardio   Procedures:   Antimicrobials:    Subjective: Pt c/o difficulty breathing but slightly improved  Objective: Vitals:   11/12/21 1523 11/12/21 1915 11/12/21 2319 11/13/21 0418  BP: (!) 101/51 106/62 112/61 115/72  Pulse: 69 73 70 74  Resp:  20 20 20   Temp: 97.9 F (36.6 C) 98.1 F (36.7 C) 97.7 F (36.5 C) 97.9 F (36.6 C)  TempSrc:  Oral Oral Oral  SpO2: 93% 93% 97% 95%  Weight:    85.6 kg  Height:        Intake/Output Summary (Last 24 hours) at  11/13/2021 0726 Last data filed at 11/13/2021 0026 Gross per 24 hour  Intake 200 ml  Output 750 ml  Net -550 ml   Filed Weights   11/12/21 0004 11/12/21 0621 11/13/21 0418  Weight: 85.8 kg 84.5 kg 85.6 kg    Examination:  General exam: Appears comfortable. Obese Respiratory system: diminished breath sounds b/l Cardiovascular system: S1/S2+. No rubs or clicks   Gastrointestinal system: Abd is soft, NT, obese & hypoactive bowel sounds  Central nervous system: Alert and oriented. Moves all extremities  Psychiatry: Judgement and insight appears normal. Flat mood and affect    Data Reviewed: I have personally reviewed following labs and imaging studies  CBC: Recent Labs  Lab 11/10/21 2352 11/13/21 0424  WBC 7.6 7.1  NEUTROABS 5.3  --   HGB 10.5* 10.7*  HCT 32.8* 33.6*  MCV 90.9 90.6  PLT 244 366   Basic Metabolic Panel: Recent Labs  Lab 11/10/21 2327 11/12/21 1405 11/13/21 0424  NA 134* 135 139  K 4.0 4.0 4.2  CL 99 99 102  CO2 26 26 25   GLUCOSE 122* 114* 99  BUN 12 18 23   CREATININE 0.90 0.97 1.01*  CALCIUM 9.0 9.1 9.5   GFR: Estimated Creatinine Clearance: 45.4 mL/min (A) (by C-G formula based on SCr of 1.01 mg/dL (H)). Liver Function Tests: No results for input(s): AST, ALT, ALKPHOS, BILITOT, PROT, ALBUMIN in the last 168 hours. No results for  input(s): LIPASE, AMYLASE in the last 168 hours. No results for input(s): AMMONIA in the last 168 hours. Coagulation Profile: No results for input(s): INR, PROTIME in the last 168 hours. Cardiac Enzymes: No results for input(s): CKTOTAL, CKMB, CKMBINDEX, TROPONINI in the last 168 hours. BNP (last 3 results) No results for input(s): PROBNP in the last 8760 hours. HbA1C: No results for input(s): HGBA1C in the last 72 hours. CBG: No results for input(s): GLUCAP in the last 168 hours. Lipid Profile: No results for input(s): CHOL, HDL, LDLCALC, TRIG, CHOLHDL, LDLDIRECT in the last 72 hours. Thyroid Function Tests: No  results for input(s): TSH, T4TOTAL, FREET4, T3FREE, THYROIDAB in the last 72 hours. Anemia Panel: No results for input(s): VITAMINB12, FOLATE, FERRITIN, TIBC, IRON, RETICCTPCT in the last 72 hours. Sepsis Labs: No results for input(s): PROCALCITON, LATICACIDVEN in the last 168 hours.  Recent Results (from the past 240 hour(s))  Resp Panel by RT-PCR (Flu A&B, Covid) Nasopharyngeal Swab     Status: None   Collection Time: 11/10/21 11:52 PM   Specimen: Nasopharyngeal Swab; Nasopharyngeal(NP) swabs in vial transport medium  Result Value Ref Range Status   SARS Coronavirus 2 by RT PCR NEGATIVE NEGATIVE Final    Comment: (NOTE) SARS-CoV-2 target nucleic acids are NOT DETECTED.  The SARS-CoV-2 RNA is generally detectable in upper respiratory specimens during the acute phase of infection. The lowest concentration of SARS-CoV-2 viral copies this assay can detect is 138 copies/mL. A negative result does not preclude SARS-Cov-2 infection and should not be used as the sole basis for treatment or other patient management decisions. A negative result may occur with  improper specimen collection/handling, submission of specimen other than nasopharyngeal swab, presence of viral mutation(s) within the areas targeted by this assay, and inadequate number of viral copies(<138 copies/mL). A negative result must be combined with clinical observations, patient history, and epidemiological information. The expected result is Negative.  Fact Sheet for Patients:  EntrepreneurPulse.com.au  Fact Sheet for Healthcare Providers:  IncredibleEmployment.be  This test is no t yet approved or cleared by the [place] FDA and  has been authorized for detection and/or diagnosis of SARS-CoV-2 by FDA under an Emergency Use Authorization (EUA). This EUA will remain  in effect (meaning this test can be used) for the duration of the COVID-19 declaration under Section 564(b)(1) of  the Act, 21 U.S.C.section 360bbb-3(b)(1), unless the authorization is terminated  or revoked sooner.       Influenza A by PCR NEGATIVE NEGATIVE Final   Influenza B by PCR NEGATIVE NEGATIVE Final    Comment: (NOTE) The Xpert Xpress SARS-CoV-2/FLU/RSV plus assay is intended as an aid in the diagnosis of influenza from Nasopharyngeal swab specimens and should not be used as a sole basis for treatment. Nasal washings and aspirates are unacceptable for Xpert Xpress SARS-CoV-2/FLU/RSV testing.  Fact Sheet for Patients: EntrepreneurPulse.com.au  Fact Sheet for Healthcare Providers: IncredibleEmployment.be  This test is not yet approved or cleared by the [place] FDA and has been authorized for detection and/or diagnosis of SARS-CoV-2 by FDA under an Emergency Use Authorization (EUA). This EUA will remain in effect (meaning this test can be used) for the duration of the COVID-19 declaration under Section 564(b)(1) of the Act, 21 U.S.C. section 360bbb-3(b)(1), unless the authorization is terminated or revoked.  Performed at [facility], [address] [place], [place] [address]          Radiology Studies: ECHOCARDIOGRAM COMPLETE  Result Date: 11/11/2021    ECHOCARDIOGRAM REPORT  Patient Name:   JASENIA Howard Date of Exam: 11/11/2021 Medical Rec #:  [id number]     Height:       58.0 in Accession #:    [id number]    Weight:       187.6 lb Date of Birth:  03-Jul-1946    BSA:          1.772 m Patient Age:    76 years      BP:           106/59 mmHg Patient Gender: F             HR:           70 bpm. Exam Location:  [facility] Procedure: 2D Echo, Cardiac Doppler and Color Doppler Indications:     Syncope R55  History:         Patient has prior history of Echocardiogram examinations, most                  recent 08/12/2017. Arrythmias:Atrial Fibrillation; Risk                  Factors:Hypertension. Aortic stenosis due to bicuspid Aortic                   valve.  Sonographer:     Sherrie Sport Referring Phys:  [id number] Athena Masse Diagnosing Phys: Donnelly Angelica  Sonographer Comments: No apical window and Technically challenging study due to limited acoustic windows. Image acquisition challenging due to COPD. IMPRESSIONS  1. Left ventricular ejection fraction, by estimation, is 60 to 65%. The left ventricle has normal function. The left ventricle demonstrates regional wall motion abnormalities (see scoring diagram/findings for description).  2. Right ventricular systolic function was not well visualized. The right ventricular size is mildly enlarged.  3. The mitral valve was not well visualized. No evidence of mitral valve regurgitation.  4. The aortic valve was not well visualized. Aortic valve regurgitation is not visualized. Conclusion(s)/Recommendation(s): POOR ACOUSTIC WINDOWS LIMITS ABILITY TO EVALUATE VALVULAR PATHOLOGY AND RV SIZE AND FUNCTION. FINDINGS  Left Ventricle: Left ventricular ejection fraction, by estimation, is 60 to 65%. The left ventricle has normal function. The left ventricle demonstrates regional wall motion abnormalities. The left ventricular internal cavity size was normal in size. There is no left ventricular hypertrophy. Right Ventricle: The right ventricular size is mildly enlarged. Right vetricular wall thickness was not well visualized. Right ventricular systolic function was not well visualized. Pericardium: There is no evidence of pericardial effusion. Mitral Valve: The mitral valve was not well visualized. No evidence of mitral valve regurgitation. Tricuspid Valve: The tricuspid valve is not well visualized. Aortic Valve: The aortic valve was not well visualized. Aortic valve regurgitation is not visualized. Pulmonic Valve: The pulmonic valve was not well visualized. Venous: The inferior vena cava was not well visualized.  LEFT VENTRICLE PLAX 2D LVIDd:         3.80 cm LVIDs:         2.50 cm LV PW:         1.30 cm LV IVS:        1.40  cm LVOT diam:     1.90 cm LVOT Area:     2.84 cm  LEFT ATRIUM         Index LA diam:    3.70 cm 2.09 cm/m   AORTA Ao Root diam: 2.30 cm  SHUNTS Systemic Diam: 1.90 cm Donnelly Angelica Electronically signed by Donnelly Angelica Signature  Date/Time: 11/11/2021/4:49:01 PM    Final         Scheduled Meds:  apixaban  5 mg Oral BID   atorvastatin  40 mg Oral QHS   bumetanide (BUMEX) IV  1 mg Intravenous Q12H   carvedilol  3.125 mg Oral BID WC   chlorhexidine  15 mL Mouth Rinse BID   diclofenac Sodium  2 g Topical TID   flecainide  50 mg Oral BID   fluticasone furoate-vilanterol  1 puff Inhalation Daily   And   umeclidinium bromide  1 puff Inhalation Daily   levothyroxine  25 mcg Oral Q0600   lisinopril  40 mg Oral Daily   loratadine  10 mg Oral Daily   mouth rinse  15 mL Mouth Rinse q12n4p   pantoprazole  40 mg Oral Daily   potassium chloride SA  40 mEq Oral BID   sertraline  100 mg Oral Daily   traZODone  50 mg Oral QHS   Continuous Infusions:   LOS: 2 days    Time spent: 30 mins     Wyvonnia Dusky, MD [facility] Pager [phone]  If 7PM-7AM, please contact night-coverage 11/13/2021, 7:26 AM

## 2021-11-13 NOTE — Evaluation (Signed)
Occupational Therapy Evaluation ?Patient Details ?Name: MARGAURITE SALIDO ?MRN: [id number] ?DOB: 06-16-1946 ?Today's Date: 11/13/2021 ? ? ?History of Present Illness MAKELL DROHAN is a 76 y.o. female with medical history significant for severe aortic stenosis status post bioprosthetic AV, chronic respiratory failure on 3 L of oxygen continuous, paroxysmal A. fib status post multiple cardioversions on flecainide and on chronic anticoagulation with Eliquis.  She presents to the ER for evaluation of shortness of breath  ? ?Clinical Impression ?  ?Ms Nand was seen for OT evaluation this date. Prior to hospital admission, pt was MOD I for mobility using 4L [place]. Pt lives alone, family local. Pt presents to acute OT demonstrating near baseline functional mobility for ADLs. Independent don/doff shirt in sitting. SUPERVISION grooming standing, limited L shoulder AROM requires increased time. MOD I sit<>stand, increased time and cues for ECS. Pt denies need for further ECS education, reports feeling near baseline. Agreeable to incentive spirometer (provided) and educated on frequency/use with good return demonstration, x10 reps. All education complete, no skilled acute OT needs, will sign off. Upon follow up no OT recommended.   ? ?Recommendations for follow up therapy are one component of a multi-disciplinary discharge planning process, led by the attending physician.  Recommendations may be updated based on patient status, additional functional criteria and insurance authorization.  ? ?Follow Up Recommendations ? No OT follow up  ?  ?Assistance Recommended at Discharge Set up Supervision/Assistance  ?Patient can return home with the following Help with stairs or ramp for entrance;Assist for transportation ? ?  ?Functional Status Assessment ? Patient has not had a recent decline in their functional status  ?Equipment Recommendations ? None recommended by OT  ?  ?Recommendations for Other Services   ? ? ?  ?Precautions /  Restrictions Precautions ?Precautions: None ?Restrictions ?Weight Bearing Restrictions: No  ? ?  ? ?Mobility Bed Mobility ?  ?  ?  ?  ?  ?  ?  ?General bed mobility comments: received and left sitting ?  ? ?Transfers ?Overall transfer level: Modified independent ?  ?  ?  ?  ?  ?  ?  ?  ?General transfer comment: O2 and increased time ?  ? ?  ?Balance Overall balance assessment: No apparent balance deficits (not formally assessed) ?  ?  ?  ?  ?  ?  ?  ?  ?  ?  ?  ?  ?  ?  ?  ?  ?  ?  ?   ? ?ADL either performed or assessed with clinical judgement  ? ?ADL Overall ADL's : Needs assistance/impaired ?  ?  ?  ?  ?  ?  ?  ?  ?  ?  ?  ?  ?  ?  ?  ?  ?  ?  ?  ?General ADL Comments: Independent don/doff shirt in sitting. SUPERVISION grooming standing, limited L shoulder AROM requires increased time  ? ? ? ? ?Pertinent Vitals/Pain Pain Assessment ?Pain Assessment: 0-10 ?Pain Score: 5  ?Pain Location: L shoulder ?Pain Descriptors / Indicators: Dull, Grimacing ?Pain Intervention(s): Limited activity within patient's tolerance, Patient requesting pain meds-RN notified  ? ? ? ?Hand Dominance Right ?  ?Extremity/Trunk Assessment Upper Extremity Assessment ?Upper Extremity Assessment: LUE deficits/detail ?LUE Deficits / Details: limited shoulder AROM ~80*, reports baseline 2/2 arthritis ?  ?Lower Extremity Assessment ?Lower Extremity Assessment: Overall WFL for tasks assessed ?  ?  ?  ?Communication Communication ?Communication: HOH ?  ?Cognition  Arousal/Alertness: Awake/alert ?Behavior During Therapy: [facility] for tasks assessed/performed ?Overall Cognitive Status: Within Functional Limits for tasks assessed ?  ?  ?  ?  ?  ?  ?  ?  ?  ?  ?  ?  ?  ?  ?  ?  ?  ?  ?  ?General Comments  SpO2 90s on 4L [place] sitting, increased WOB standing but unable to read with pulse ox ? ?  ?Exercises Exercises: Other exercises ?Other Exercises ?Other Exercises: IS provided and instructed - 10 reps ?  ?Shoulder Instructions    ? ? ?Home Living Family/patient  expects to be discharged to:: Private residence ?Living Arrangements: Other relatives ?Available Help at Discharge: Family;Available PRN/intermittently (sister is local, brother in [employer]) ?  ?  ?  ?  ?  ?  ?  ?  ?  ?  ?  ?  ?  ?  ?Additional Comments: pt deferred answering home setup ?  ? ?  ?Prior Functioning/Environment Prior Level of Function : Independent/Modified Independent ?  ?  ?  ?  ?  ?  ?Mobility Comments: 4L [place] baseline ?ADLs Comments: Independent ?  ? ?  ?  ?OT Problem List: Decreased range of motion;Decreased activity tolerance ?  ?   ?   ?OT Goals(Current goals can be found in the care plan section) Acute Rehab OT Goals ?Patient Stated Goal: to go home ?OT Goal Formulation: With patient ?Time For Goal Achievement: 11/27/21 ?Potential to Achieve Goals: Good  ? ?AM-PAC OT "6 Clicks" Daily Activity     ?Outcome Measure Help from another person eating meals?: None ?Help from another person taking care of personal grooming?: A Little ?Help from another person toileting, which includes using toliet, bedpan, or urinal?: A Little ?Help from another person bathing (including washing, rinsing, drying)?: A Little ?Help from another person to put on and taking off regular upper body clothing?: None ?Help from another person to put on and taking off regular lower body clothing?: None ?6 Click Score: 21 ?  ?End of Session   ? ?Activity Tolerance: Patient tolerated treatment well ?Patient left: in chair;with call bell/phone within reach ? ?OT Visit Diagnosis: Unsteadiness on feet (R26.81)  ?              ?Time: 3220-2542 ?OT Time Calculation (min): 15 min ?Charges:  OT General Charges ?$OT Visit: 1 Visit ?OT Evaluation ?$OT Eval Low Complexity: 1 Low ? ?Dessie Coma, M.S. OTR/L  ?11/13/21, 10:48 AM  ?ascom [phone] ? ?

## 2021-11-13 NOTE — Progress Notes (Signed)
Mobility Specialist - Progress Note ? ? ? 11/13/21 1400  ?Mobility  ?Activity Ambulated independently in room;Stood at bedside;Dangled on edge of bed  ?Level of Assistance Independent  ?Assistive Device None  ?Distance Ambulated (ft) 20 ft  ?Activity Response Tolerated well  ?$Mobility charge 1 Mobility  ? ? ?Pre-mobility: 92% SpO2 ?During mobility: 78HR, BP, 88-86% SpO2 ? ?Pt in chair upon arrival using 4L [place]. Pt completed STS indep and ambulated indep to the door and back ---- SOB noted. Pt returned to chair with O2 at 88%, bumped to 5L and left in chair with needs in reach and O2 at 93%.  ? ?[name] ?Mobility Specialist ?11/13/21, 2:56 PM ? ? ? ?

## 2021-11-14 ENCOUNTER — Inpatient Hospital Stay: Payer: Medicare Other

## 2021-11-14 LAB — BASIC METABOLIC PANEL
Anion gap: 6 (ref 5–15)
BUN: 22 mg/dL (ref 8–23)
CO2: 28 mmol/L (ref 22–32)
Calcium: 8.9 mg/dL (ref 8.9–10.3)
Chloride: 100 mmol/L (ref 98–111)
Creatinine, Ser: 0.87 mg/dL (ref 0.44–1.00)
GFR, Estimated: >60 mL/min (ref >60)
Glucose, Bld: 98 mg/dL (ref 70–99)
Potassium: 4.4 mmol/L (ref 3.5–5.1)
Sodium: 134 mmol/L — ABNORMAL LOW (ref 135–145)

## 2021-11-14 LAB — CBC
HCT: 30.9 % — ABNORMAL LOW (ref 36.0–46.0)
Hemoglobin: 9.9 g/dL — ABNORMAL LOW (ref 12.0–15.0)
MCH: 29.4 pg (ref 26.0–34.0)
MCHC: 32 g/dL (ref 30.0–36.0)
MCV: 91.7 fL (ref 80.0–100.0)
Platelets: 235 10*3/uL (ref 150–400)
RBC: 3.37 MIL/uL — ABNORMAL LOW (ref 3.87–5.11)
RDW: 14.5 % (ref 11.5–15.5)
WBC: 5.1 10*3/uL (ref 4.0–10.5)
nRBC: 0 % (ref 0.0–0.2)

## 2021-11-14 MED ORDER — FLECAINIDE ACETATE 50 MG PO TABS: 50.0000 mg | ORAL_TABLET | Freq: Two times a day (BID) | ORAL | Status: DC

## 2021-11-14 MED ORDER — AMOXICILLIN-POT CLAVULANATE 875-125 MG PO TABS: 1.0000 | ORAL_TABLET | Freq: Two times a day (BID) | ORAL | 0 refills | Status: AC

## 2021-11-14 NOTE — Discharge Summary (Addendum)
Physician Discharge Summary  [name] SNK:[id number] DOB: 01/16/1946 DOA: 11/10/2021  PCP: [name], MD  Admit date: 11/10/2021 Discharge date: 11/14/2021  Admitted From: home  Disposition:  home w/ home health   Recommendations for Outpatient Follow-up:  Follow up with PCP in 1 week F/u w/ cardio, Dr. Saralyn Pilar, in 1 week  Home Health: yes Equipment/Devices:  Discharge Condition: stable  CODE STATUS: full  Diet recommendation: Heart Healthy   Brief/Interim Summary: HPI was taken from Dr. Damita Dunnings: [name] is a 76 y.o. female with medical history significant for severe aortic stenosis status post bioprosthetic AV, chronic respiratory failure on 3 L of oxygen continuous, paroxysmal A. fib status post multiple cardioversions on flecainide and on chronic anticoagulation with Eliquis.  She presents to the ER for evaluation of shortness of breath which has progressively worsened over the last couple of days and mostly with exertion severe aortic stenosis status post bioprosthetic AV, chronic respiratory failure on 4 L of oxygen continuous, paroxysmal A. fib status post multiple cardioversions on flecainide and on chronic anticoagulation with Eliquis.  She presents to the ER for evaluation of shortness of breath and respiratory distress.   EMS reports sats on 4 L nasal cannula oxygen dropped to 82% while ambulating approximately 10 feet.  And she was placed on 6 L.  Patient reports she has seen PCP for same and has had her Lasix dosages adjusted.  Endorses associated chest tightness.  Denies fever, cough, abdominal pain, nausea, vomiting or dizziness   ED course: On arrival temperature 99.7, tachypneic to 21 but otherwise normal vitals.  Troponin 9, BNP 174, hemoglobin 10.5, otherwise unremarkable.  EKG, personally viewed and interpreted: Paced rhythm at 72.  Chest x-ray with no active cardiopulmonary disease.  Chronic interstitial opacities.  Patient treated with IV Bumex.  Hospitalist  consulted for admission.  As per Dr. Jimmye Norman 3/8-3/10/23: Pt presented w/ shortness of breath that was found to be secondary to CHF exacerbation. Pt was treated w/ IV diuretics, coreg, lisinopril as per cardio. Pt did require supplemental oxygen up to 4L [place] while inpatient and pt was d/c home w/ this as well. Of note, pt's PCP, Dr. Stephanie Coup requested that pt get a CT chest while the pt was still inpatient as pt was previously scheduled outpatient for the CT chest. CT chest did show likely aspiration pneumonia and pt was d/c home w/ po augmentin. Pt will f/u w/ PCP, Dr. Stephanie Coup to review CT chest further. Pt verbalized her understanding.    Discharge Diagnoses:  Principal Problem:   Acute on chronic diastolic CHF (congestive heart failure) ([name]) Active Problems:   Acute on chronic respiratory failure with hypoxia ([name])   Essential hypertension   Paroxysmal atrial fibrillation ([name])   History of aortic valve replacement with bioprosthetic valve   Cardiac pacemaker in situ  Acute on chronic diastolic CHF: slowly improving.   Continue on bumex, coreg, lisinopril as per cardio. Monitor I/Os and daily weights. Echo shows EF 60-65%, LV demonstrates regional wall motion abnormalities. Cardio following and recs apprec    Acute on chronic hypoxic respiratory failure: likely secondary to CHF exacerbation. Continue on supplemental oxygen and wean  back to baseline as tolerated, currently on 4 L [place]  Aspiration pneumonia: as per CT chest. Started on po augmentin. Pt's PCP Dr. Stephanie Coup will further review CT chest with pt outpatient.    Hx of pacemaker: check ordered in ED    Hx of aortic valve replacement:  w/ bioprosthetic valve.   PAF: continue on flecainide, coreg & eliquis    HTN: continue on amlodipine, coreg   Normocytic anemia: H&H are stable. No need for a transfusion currently   Discharge Instructions  Discharge Instructions     Diet - low sodium heart healthy   Complete by: As directed     Discharge instructions   Complete by: As directed    F/u w/ cardio, Dr. Saralyn Pilar, in 1 week. F/u w/ PCP in 1 week   Increase activity slowly   Complete by: As directed       Allergies as of 11/14/2021       Reactions   Benadryl [diphenhydramine Hcl (sleep)] Palpitations   Cetirizine Palpitations   Lasix [furosemide] Rash   Levaquin [levofloxacin In D5w] Other (See Comments)   Reaction:  Fatigue and muscle soreness   Meloxicam Rash   Soy Allergy Hives, Nausea And Vomiting   Sulfa Antibiotics Rash        Medication List     STOP taking these medications    dicyclomine 10 MG capsule Commonly known as: BENTYL       TAKE these medications    albuterol 108 (90 Base) MCG/ACT inhaler Commonly known as: Ventolin HFA Inhale 2 puffs into the lungs every 6 (six) hours as needed for wheezing or shortness of breath.   ALPRAZolam 0.25 MG tablet Commonly known as: XANAX Take 1 tablet (0.25 mg total) by mouth 2 (two) times daily as needed for anxiety.   amLODipine 10 MG tablet Commonly known as: NORVASC Take 1 tablet by mouth daily.   amoxicillin 500 MG capsule Commonly known as: AMOXIL Take 4 cap po 1 hrs before dental procedure   amoxicillin-clavulanate 875-125 MG tablet Commonly known as: Augmentin Take 1 tablet by mouth 2 (two) times daily for 5 days.   apixaban 5 MG Tabs tablet Commonly known as: Eliquis Take 1 tablet (5 mg total) by mouth 2 (two) times daily.   atorvastatin 40 MG tablet Commonly known as: LIPITOR 1 tab po qhs   bumetanide 1 MG tablet Commonly known as: BUMEX TAKE 2 TABLETS BY MOUTH TWICE DAILY   CALCIUM 600+D PO Take 1 tablet by mouth daily.   carvedilol 3.125 MG tablet Commonly known as: COREG Take 3.125 mg by mouth 2 (two) times daily with a meal.   CoQ10 200 MG Caps Take 200 mg by mouth daily.   diclofenac Sodium 1 % Gel Commonly known as: VOLTAREN SMARTSIG:Gram(s) Topical Twice Daily   ferrous sulfate 325 (65 FE) MG  tablet Take 2 tablets (650 mg total) by mouth 2 (two) times daily with a meal. What changed:  how much to take when to take this additional instructions   flecainide 50 MG tablet Commonly known as: TAMBOCOR Take 1 tablet (50 mg total) by mouth 2 (two) times daily. What changed:  when to take this Another medication with the same name was removed. Continue taking this medication, and follow the directions you see here.   levothyroxine 25 MCG tablet Commonly known as: SYNTHROID Take 1 tablet (25 mcg total) by mouth daily before breakfast.   lisinopril 40 MG tablet Commonly known as: ZESTRIL TAKE 1 TABLET(40 MG) BY MOUTH DAILY(DOSE INCREASE)   loratadine 10 MG tablet Commonly known as: CLARITIN Take 10 mg by mouth daily.   oxyCODONE-acetaminophen 7.5-325 MG tablet Commonly known as: Percocet Take one tab in am and half in pm for severe shoulder pain   pantoprazole 40 MG tablet Commonly  known as: PROTONIX Take 1 tablet (40 mg total) by mouth daily.   potassium chloride 10 MEQ tablet Commonly known as: KLOR-CON Take 4 tablets (40 mEq total) by mouth 2 (two) times daily.   sertraline 100 MG tablet Commonly known as: ZOLOFT Take one tab a day for Anxiety   sodium chloride 0.65 % Soln nasal spray Commonly known as: OCEAN Place 1 spray into both nostrils as needed for congestion.   traMADol 50 MG tablet Commonly known as: ULTRAM Take one tab po bid for pain   traZODone 50 MG tablet Commonly known as: DESYREL Take 1 tablet (50 mg total) by mouth at bedtime.   Trelegy Ellipta 100-62.5-25 MCG/ACT Aepb Generic drug: Fluticasone-Umeclidin-Vilant Inhale 1 puff into the lungs daily.               Durable Medical Equipment  (From admission, onward)           Start     Ordered   11/14/21 1046  For home use only DME oxygen  Once       Question Answer Comment  Length of Need Lifetime   Mode or (Route) Nasal cannula   Liters per Minute 4   Frequency  Continuous (stationary and portable oxygen unit needed)   Oxygen conserving device Yes   Oxygen delivery system Gas      11/14/21 1046            Allergies  Allergen Reactions   Benadryl [Diphenhydramine Hcl (Sleep)] Palpitations   Cetirizine Palpitations   Lasix [Furosemide] Rash   Levaquin [Levofloxacin In D5w] Other (See Comments)    Reaction:  Fatigue and muscle soreness   Meloxicam Rash   Soy Allergy Hives and Nausea And Vomiting   Sulfa Antibiotics Rash    Consultations: Cardio, Dr. Corky Sox    Procedures/Studies: DG Chest 2 View  Result Date: 11/11/2021 CLINICAL DATA:  Shortness of breath EXAM: CHEST - 2 VIEW COMPARISON:  10/27/2021, 03/17/2021 FINDINGS: Post sternotomy changes. Left-sided pacing device as before. Mild cardiomegaly. Mild diffuse reticular interstitial opacity likely due to chronic change. Subsegmental atelectasis or scar at the bases. No pleural effusion or confluent airspace disease. Aortic atherosclerosis. IMPRESSION: No active cardiopulmonary disease. Mild cardiomegaly. Chronic interstitial opacities. Electronically Signed   By: Donavan Foil M.D.   On: 11/11/2021 00:18   DG Chest 2 View  Result Date: 10/27/2021 CLINICAL DATA:  Shortness of breath. EXAM: CHEST - 2 VIEW COMPARISON:  10/23/2021 and CT chest 05/11/2020. FINDINGS: Patient is rotated. Trachea is midline. Heart is enlarged, stable. Thoracic aorta is calcified. Pacemaker lead tips are seen in the right atrium and right ventricle. Image quality on the frontal view is degraded by technique. Mild accentuation of the pulmonary markings, unchanged. No airspace consolidation or pleural fluid. IMPRESSION: Mild accentuation of the pulmonary markings may be chronic. No airspace consolidation. Electronically Signed   By: Lorin Picket M.D.   On: 10/27/2021 13:09   DG Chest 2 View  Result Date: 10/23/2021 CLINICAL DATA:  Worsening shortness of breath over the last week. EXAM: CHEST - 2 VIEW COMPARISON:   03/17/2021 FINDINGS: Dual lead pacer noted. Aortic valve prosthesis observed with evidence of prior median sternotomy. Atherosclerotic calcification of the aortic arch. Stable mild to moderate enlargement of the cardiopericardial silhouette. Fine reticular interstitial accentuation in the lungs without well-defined Kerley B lines. Mildly indistinct pulmonary vasculature. Right axillary clips noted. No blunting of the costophrenic angles. Thoracic spondylosis noted. Degenerative glenohumeral arthropathy bilaterally. IMPRESSION: 1. Mild indistinctness  of the pulmonary vasculature which may be from pulmonary venous hypertension. There is also some fine reticular interstitial accentuation in the lungs but without overt Kerley B lines to further indicate interstitial pulmonary edema. 2. Mild to moderate enlargement of the cardiopericardial silhouette. Dual lead pacer and aortic valve prosthesis noted. 3.  Aortic Atherosclerosis (ICD10-I70.0). Electronically Signed   By: Van Clines M.D.   On: 10/23/2021 17:29   CT CHEST WO CONTRAST  Result Date: 11/14/2021 CLINICAL DATA:  Inpatient. Acute on chronic diastolic CHF. Acute on chronic respiratory failure with hypoxia. Chronic dyspnea. EXAM: CT CHEST WITHOUT CONTRAST TECHNIQUE: Multidetector CT imaging of the chest was performed following the standard protocol without IV contrast. RADIATION DOSE REDUCTION: This exam was performed according to the departmental dose-optimization program which includes automated exposure control, adjustment of the mA and/or kV according to patient size and/or use of iterative reconstruction technique. COMPARISON:  11/11/2021 chest radiograph.  05/11/2020 chest CT. FINDINGS: Cardiovascular: Stable mild-to-moderate cardiomegaly. No significant pericardial effusion/thickening. Stable 2 lead left subclavian pacemaker with lead tips in the right atrium and right ventricular apex. Aortic valve prosthesis in place. Left anterior descending  and left circumflex coronary atherosclerosis. Atherosclerotic nonaneurysmal thoracic aorta. Top-normal caliber main pulmonary artery (3.3 cm diameter). Mediastinum/Nodes: Scattered subcentimeter thyroid nodules, largest 0.9 cm on the left, unchanged. Not clinically significant; no follow-up imaging recommended (ref: J Am Coll Radiol. 2015 Feb;12(2): 143-50). Unremarkable esophagus. Right retropectoral surgical clips again noted. No right axillary adenopathy. Enlarged 1.4 cm short axis diameter left axillary node (series 2/image 53), stable. Lungs/Pleura: No pneumothorax. No pleural effusion. Severe centrilobular emphysema with mild diffuse bronchial wall thickening. Solid 0.8 cm apical right upper lobe pulmonary nodule (series 3/image 30), new. Mild-to-moderate patchy consolidation and ground-glass opacity throughout the right greater than left lower lobes and dependent left upper lobe, generally new or in a new distribution in the interval. Upper abdomen: Cholelithiasis. Musculoskeletal: No aggressive appearing focal osseous lesions. Intact sternotomy wires. Marked thoracic spondylosis. IMPRESSION: 1. Mild-to-moderate patchy consolidation and ground-glass opacity throughout the right greater than left lower lobes and dependent left upper lobe, generally new or in a new distribution in the interval, most compatible with multilobar pneumonia or aspiration. 2. New 0.8 cm solid apical right upper lobe pulmonary nodule. Recommend attention on follow-up chest CT in 3-6 months. 3. Stable mild-to-moderate cardiomegaly. Two-vessel coronary atherosclerosis. 4. Cholelithiasis. 5. Aortic Atherosclerosis (ICD10-I70.0) and Emphysema (ICD10-J43.9). Electronically Signed   By: Ilona Sorrel M.D.   On: 11/14/2021 12:07   ECHOCARDIOGRAM COMPLETE  Result Date: 11/11/2021    ECHOCARDIOGRAM REPORT   Patient Name:   TELETHA PETREA Date of Exam: 11/11/2021 Medical Rec #:  [id number]     Height:       58.0 in Accession #:    [id number]     Weight:       187.6 lb Date of Birth:  05/05/1946    BSA:          1.772 m Patient Age:    32 years      BP:           106/59 mmHg Patient Gender: F             HR:           70 bpm. Exam Location:  [facility] Procedure: 2D Echo, Cardiac Doppler and Color Doppler Indications:     Syncope R55  History:         Patient has prior history of Echocardiogram examinations, most  recent 08/12/2017. Arrythmias:Atrial Fibrillation; Risk                  Factors:Hypertension. 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  Sonographer:     Sherrie Sport Referring Phys:  [id number] Athena Masse Diagnosing Phys: Donnelly Angelica  Sonographer Comments: No apical window and Technically challenging study due to limited acoustic windows. Image acquisition challenging due to COPD. IMPRESSIONS  1. Left ventricular ejection fraction, by estimation, is 60 to 65%. The left ventricle has normal function. The left ventricle demonstrates regional wall motion abnormalities (see scoring diagram/findings for description).  2. Right ventricular systolic function was not well visualized. The right ventricular size is mildly enlarged.  3. The mitral valve was not well visualized. No evidence of mitral valve regurgitation.  4. The aortic valve was not well visualized. Aortic valve regurgitation is not visualized. Conclusion(s)/Recommendation(s): POOR ACOUSTIC WINDOWS LIMITS ABILITY TO EVALUATE VALVULAR PATHOLOGY AND RV SIZE AND FUNCTION. FINDINGS  Left Ventricle: Left ventricular ejection fraction, by estimation, is 60 to 65%. The left ventricle has normal function. The left ventricle demonstrates regional wall motion abnormalities. The left ventricular internal cavity size was normal in size. There is no left ventricular hypertrophy. Right Ventricle: The right ventricular size is mildly enlarged. Right vetricular wall thickness was not well visualized. Right ventricular systolic function was not well visualized. Pericardium: There  is no evidence of pericardial effusion. Mitral Valve: The mitral valve was not well visualized. No evidence of mitral valve regurgitation. Tricuspid Valve: The tricuspid valve is not well visualized. Aortic Valve: The aortic valve was not well visualized. Aortic valve regurgitation is not visualized. Pulmonic Valve: The pulmonic valve was not well visualized. Venous: The inferior vena cava was not well visualized.  LEFT VENTRICLE PLAX 2D LVIDd:         3.80 cm LVIDs:         2.50 cm LV PW:         1.30 cm LV IVS:        1.40 cm LVOT diam:     1.90 cm LVOT Area:     2.84 cm  LEFT ATRIUM         Index LA diam:    3.70 cm 2.09 cm/m   AORTA Ao Root diam: 2.30 cm  SHUNTS Systemic Diam: 1.90 cm Donnelly Angelica Electronically signed by Donnelly Angelica Signature Date/Time: 11/11/2021/4:49:01 PM    Final    (Echo, Carotid, EGD, Colonoscopy, ERCP)    Subjective: pt c/o fatigue    Discharge Exam: Vitals:   11/14/21 0812 11/14/21 1432  BP: (!) 144/66 (!) 114/56  Pulse: 76 71  Resp: 18 19  Temp: 98.4 F (36.9 C) 98 F (36.7 C)  SpO2: 99% 97%   Vitals:   11/14/21 0615 11/14/21 0812 11/14/21 0900 11/14/21 1432  BP: 139/73 (!) 144/66  (!) 114/56  Pulse: 72 76  71  Resp:  18  19  Temp: 98.7 F (37.1 C) 98.4 F (36.9 C)  98 F (36.7 C)  TempSrc: Oral Oral  Oral  SpO2: 93% 99%  97%  Weight:   84.6 kg   Height:        General: Pt is alert, awake, not in acute distress Cardiovascular: S1/S2 +, no rubs, no gallops Respiratory: decreased breath sounds b/l otherwise clear  Abdominal: Soft, NT, obese, bowel sounds + Extremities:  no cyanosis  The results of significant diagnostics from this hospitalization (including imaging, microbiology, ancillary and laboratory) are listed below for reference.     Microbiology: Recent Results (from the past 240 hour(s))  Resp Panel by RT-PCR (Flu A&B, Covid) Nasopharyngeal Swab     Status: None   Collection Time: 11/10/21 11:52 PM   Specimen: Nasopharyngeal Swab;  Nasopharyngeal(NP) swabs in vial transport medium  Result Value Ref Range Status   SARS Coronavirus 2 by RT PCR NEGATIVE NEGATIVE Final    Comment: (NOTE) SARS-CoV-2 target nucleic acids are NOT DETECTED.  The SARS-CoV-2 RNA is generally detectable in upper respiratory specimens during the acute phase of infection. The lowest concentration of SARS-CoV-2 viral copies this assay can detect is 138 copies/mL. A negative result does not preclude SARS-Cov-2 infection and should not be used as the sole basis for treatment or other patient management decisions. A negative result may occur with  improper specimen collection/handling, submission of specimen other than nasopharyngeal swab, presence of viral mutation(s) within the areas targeted by this assay, and inadequate number of viral copies(<138 copies/mL). A negative result must be combined with clinical observations, patient history, and epidemiological information. The expected result is Negative.  Fact Sheet for Patients:  EntrepreneurPulse.com.au  Fact Sheet for Healthcare Providers:  IncredibleEmployment.be  This test is no t yet approved or cleared by the [place] FDA and  has been authorized for detection and/or diagnosis of SARS-CoV-2 by FDA under an Emergency Use Authorization (EUA). This EUA will remain  in effect (meaning this test can be used) for the duration of the COVID-19 declaration under Section 564(b)(1) of the Act, 21 U.S.C.section 360bbb-3(b)(1), unless the authorization is terminated  or revoked sooner.       Influenza A by PCR NEGATIVE NEGATIVE Final   Influenza B by PCR NEGATIVE NEGATIVE Final    Comment: (NOTE) The Xpert Xpress SARS-CoV-2/FLU/RSV plus assay is intended as an aid in the diagnosis of influenza from Nasopharyngeal swab specimens and should not be used as a sole basis for treatment. Nasal washings and aspirates are unacceptable for Xpert Xpress  SARS-CoV-2/FLU/RSV testing.  Fact Sheet for Patients: EntrepreneurPulse.com.au  Fact Sheet for Healthcare Providers: IncredibleEmployment.be  This test is not yet approved or cleared by the [place] FDA and has been authorized for detection and/or diagnosis of SARS-CoV-2 by FDA under an Emergency Use Authorization (EUA). This EUA will remain in effect (meaning this test can be used) for the duration of the COVID-19 declaration under Section 564(b)(1) of the Act, 21 U.S.C. section 360bbb-3(b)(1), unless the authorization is terminated or revoked.  Performed at [facility], [address] [place],  [address]      Labs: BNP (last 3 results) Recent Labs    03/17/21 2105 10/24/21 1542 11/10/21 2352  BNP 267.5* 170.5* 517.6*   Basic Metabolic Panel: Recent Labs  Lab 11/10/21 2327 11/12/21 1405 11/13/21 0424 11/14/21 0411  NA 134* 135 139 134*  K 4.0 4.0 4.2 4.4  CL 99 99 102 100  CO2 26 26 25 28   GLUCOSE 122* 114* 99 98  BUN 12 18 23 22   CREATININE 0.90 0.97 1.01* 0.87  CALCIUM 9.0 9.1 9.5 8.9   Liver Function Tests: No results for input(s): AST, ALT, ALKPHOS, BILITOT, PROT, ALBUMIN in the last 168 hours. No results for input(s): LIPASE, AMYLASE in the last 168 hours. No results for input(s): AMMONIA in the last 168 hours. CBC: Recent Labs  Lab 11/10/21 2352 11/13/21 0424 11/14/21 0411  WBC  7.6 7.1 5.1  NEUTROABS 5.3  --   --   HGB 10.5* 10.7* 9.9*  HCT 32.8* 33.6* 30.9*  MCV 90.9 90.6 91.7  PLT 244 283 235   Cardiac Enzymes: No results for input(s): CKTOTAL, CKMB, CKMBINDEX, TROPONINI in the last 168 hours. BNP: Invalid input(s): POCBNP CBG: No results for input(s): GLUCAP in the last 168 hours. D-Dimer No results for input(s): DDIMER in the last 72 hours. Hgb A1c No results for input(s): HGBA1C in the last 72 hours. Lipid Profile No results for input(s): CHOL, HDL, LDLCALC, TRIG, CHOLHDL,  LDLDIRECT in the last 72 hours. Thyroid function studies No results for input(s): TSH, T4TOTAL, T3FREE, THYROIDAB in the last 72 hours.  Invalid input(s): FREET3 Anemia work up No results for input(s): VITAMINB12, FOLATE, FERRITIN, TIBC, IRON, RETICCTPCT in the last 72 hours. Urinalysis    Component Value Date/Time   COLORURINE YELLOW (A) 01/29/2021 1910   APPEARANCEUR Clear 07/15/2021 1606   LABSPEC 1.013 01/29/2021 1910   LABSPEC 1.009 10/16/2011 0022   PHURINE 6.0 01/29/2021 1910   GLUCOSEU Negative 07/15/2021 1606   GLUCOSEU Negative 10/16/2011 0022   HGBUR NEGATIVE 01/29/2021 1910   BILIRUBINUR Negative 07/15/2021 1606   BILIRUBINUR Negative 10/16/2011 0022   KETONESUR NEGATIVE 01/29/2021 1910   PROTEINUR Negative 07/15/2021 1606   PROTEINUR NEGATIVE 01/29/2021 1910   UROBILINOGEN negative (A) 02/10/2018 1439   NITRITE Negative 07/15/2021 1606   NITRITE NEGATIVE 01/29/2021 1910   LEUKOCYTESUR Negative 07/15/2021 1606   LEUKOCYTESUR NEGATIVE 01/29/2021 1910   LEUKOCYTESUR Trace 10/16/2011 0022   Sepsis Labs Invalid input(s): PROCALCITONIN,  WBC,  LACTICIDVEN Microbiology Recent Results (from the past 240 hour(s))  Resp Panel by RT-PCR (Flu A&B, Covid) Nasopharyngeal Swab     Status: None   Collection Time: 11/10/21 11:52 PM   Specimen: Nasopharyngeal Swab; Nasopharyngeal(NP) swabs in vial transport medium  Result Value Ref Range Status   SARS Coronavirus 2 by RT PCR NEGATIVE NEGATIVE Final    Comment: (NOTE) SARS-CoV-2 target nucleic acids are NOT DETECTED.  The SARS-CoV-2 RNA is generally detectable in upper respiratory specimens during the acute phase of infection. The lowest concentration of SARS-CoV-2 viral copies this assay can detect is 138 copies/mL. A negative result does not preclude SARS-Cov-2 infection and should not be used as the sole basis for treatment or other patient management decisions. A negative result may occur with  improper specimen  collection/handling, submission of specimen other than nasopharyngeal swab, presence of viral mutation(s) within the areas targeted by this assay, and inadequate number of viral copies(<138 copies/mL). A negative result must be combined with clinical observations, patient history, and epidemiological information. The expected result is Negative.  Fact Sheet for Patients:  EntrepreneurPulse.com.au  Fact Sheet for Healthcare Providers:  IncredibleEmployment.be  This test is no t yet approved or cleared by the [place] FDA and  has been authorized for detection and/or diagnosis of SARS-CoV-2 by FDA under an Emergency Use Authorization (EUA). This EUA will remain  in effect (meaning this test can be used) for the duration of the COVID-19 declaration under Section 564(b)(1) of the Act, 21 U.S.C.section 360bbb-3(b)(1), unless the authorization is terminated  or revoked sooner.       Influenza A by PCR NEGATIVE NEGATIVE Final   Influenza B by PCR NEGATIVE NEGATIVE Final    Comment: (NOTE) The Xpert Xpress SARS-CoV-2/FLU/RSV plus assay is intended as an aid in the diagnosis of influenza from Nasopharyngeal swab specimens and should not be used as a sole  basis for treatment. Nasal washings and aspirates are unacceptable for Xpert Xpress SARS-CoV-2/FLU/RSV testing.  Fact Sheet for Patients: EntrepreneurPulse.com.au  Fact Sheet for Healthcare Providers: IncredibleEmployment.be  This test is not yet approved or cleared by the [place] FDA and has been authorized for detection and/or diagnosis of SARS-CoV-2 by FDA under an Emergency Use Authorization (EUA). This EUA will remain in effect (meaning this test can be used) for the duration of the COVID-19 declaration under Section 564(b)(1) of the Act, 21 U.S.C. section 360bbb-3(b)(1), unless the authorization is terminated or revoked.  Performed at [facility], [address] [place], [place] [address]      Time coordinating discharge: Over 30 minutes  SIGNED:   Wyvonnia Dusky, MD  [facility] 11/14/2021, 2:44 PM Pager   If 7PM-7AM, please contact night-coverage

## 2021-11-14 NOTE — TOC Progression Note (Addendum)
Transition of Care (TOC) - Progression Note  ? ? ?Patient Details  ?Name: [name] ?MRN: [id number] ?Date of Birth: 10-Mar-1946 ? ?Transition of Care (TOC) CM/SW Contact  ?Candie Chroman, LCSW ?Phone Number: ?11/14/2021, 9:35 AM ? ?Clinical Narrative: Per chart review, patient gets her home oxygen through [employer]. CSW called them and confirmed. The last orders they received were 12/28/2019. Patient currently has a portable and concentrator that  go up to 5 L. She also has a G4 unit that goes up to 3 L and she has had this since about 2014. According to PT note from yesterday, patient went up to 6 L while ambulating. Representative will have their team call CSW to determine what new documentation and orders they will need to upgrade her equipment.  ? ?10:23 am: Received call back from Sherri at [facility]. The last orders they have for her are from December 2022 when patient was on 2 L. They need sats test and DME order to order new equipment. If faxed to them by 1:00 [facility] time) today, they can have it shipped to the home by tomorrow by [employer]. Someone will have to sign for the delivery. ? ?11:52 am: Faxed requested documentation to [employer]. Sherri confirmed receipt. They will not need to send out any new equipment since current oxygen equipment manages up to 5 L. ? ?Expected Discharge Plan: [place] ?Barriers to Discharge: Continued Medical Work up ? ?Expected Discharge Plan and Services ?Expected Discharge Plan: [place] ?  ?Discharge Planning Services: CM Consult ?  ?Living arrangements for the past 2 months: Apartment ?                ?DME Arranged: N/A ?DME Agency: NA ?  ?  ?  ?  ?  ?  ?  ?  ? ? ?Social Determinants of Health (SDOH) Interventions ?  ? ?Readmission Risk Interventions ?No flowsheet data found. ? ?

## 2021-11-14 NOTE — Progress Notes (Signed)
Mobility Specialist - Progress Note ? ? ? 11/14/21 1300  ?Mobility  ?Activity Refused mobility  ? ? ?Pt sitting in chair using 4L upon arrival. Pt refuses session despite max encouragement d/t voiced fatigue from medication and SOB. Will attempt another date and time. ? ?Merrily Brittle ?Mobility Specialist ?11/14/21, 1:56 PM ? ? ? ?

## 2021-11-14 NOTE — TOC Transition Note (Signed)
Transition of Care (TOC) - CM/SW Discharge Note ? ? ?Patient Details  ?Name: ANEDRA PENAFIEL ?MRN: [id number] ?Date of Birth: 02-27-1946 ? ?Transition of Care (TOC) CM/SW Contact:  ?Candie Chroman, LCSW ?Phone Number: ?11/14/2021, 2:52 PM ? ? ?Clinical Narrative: Patient has orders to discharge home today. Left message for [facility] representative to notify. No further concerns. CSW signing off.   ? ?Final next level of care: [place] ?Barriers to Discharge: Barriers Resolved ? ? ?Patient Goals and CMS Choice ?Patient states their goals for this hospitalization and ongoing recovery are:: Patient reports she just feels so bad and wants to get her strength back ?  ?Choice offered to / list presented to : Patient ? ?Discharge Placement ?  ?           ?  ?  ?  ?Patient and family notified of of transfer: 11/14/21 ? ?Discharge Plan and Services ?  ?Discharge Planning Services: CM Consult ?           ?DME Arranged: N/A ?DME Agency: NA ?  ?  ?  ?HH Arranged: Therapist, sports, Nurse's Aide ?[place] Agency: [facility] ?Date HH Agency Contacted: 11/14/21 ?  ?Representative spoke with at [place]: Juanda Crumble ? ?Social Determinants of Health (SDOH) Interventions ?  ? ? ?Readmission Risk Interventions ?No flowsheet data found. ? ? ? ? ?

## 2021-11-14 NOTE — Care Management Important Message (Signed)
Important Message ? ?Patient Details  ?Name: [name] ?MRN: [id number] ?Date of Birth: [birth year] ? ? ?Medicare Important Message Given:  Yes ? ? ? ? ?Dannette Barbara ?11/14/2021, 12:43 PM ?

## 2021-11-14 NOTE — Progress Notes (Addendum)
SATURATION QUALIFICATIONS: (This note is used to comply with regulatory documentation for home oxygen) ? ?Patient Saturations on Room Air at Rest = 86% ? ?Patient Saturations on Room Air while Ambulating = n/a% ? ?Patient Saturations on 4 Liters of oxygen while Ambulating = 96% ? ?Please briefly explain why patient needs home oxygen: ?

## 2021-11-14 NOTE — Progress Notes (Signed)
[facility]  CARDIOLOGY CONSULT NOTE  Patient ID: [name] MRN: [id number] DOB/AGE: 1946/06/03 76 y.o.  Admit date: 11/10/2021 Referring Physician Eppie Gibson, MD Primary Physician [name], MD Primary Cardiologist Paras[place] Reason for Consultation ? HFpEF.  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  Interval history: - Unable to see patient prior to discharge today.   Review of systems complete and found to be negative unless listed above     Past Medical History:  Diagnosis Date   Aortic 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 s/p bioprosthetic valve replacement 2008 at [facility];  b. 01/2015 Echo: EF 60-65%, no rwma, Gr1 DD, mildly dil LA, nl RV fxn.   Atrial fibrillation with RVR ([place]) 01/11/2015   Atrial flutter ([place])    a. 08/2016 s/p DCCV.  Remains on flecainide 50 mg bid.   Basal skull fracture ([name]) 20 yrs ago   CHF (congestive heart failure) ([name])    Chronic respiratory failure ([name])    COPD (chronic obstructive pulmonary disease) ([place])    a. 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 2008 prior to Aortic aneurysm repair-->nl cors.   History of stress test    a. 10/2015 MV: no ischemia/infarct.   HLD (hyperlipidemia)    HTN (hypertension)    Hypothyroidism    Obesity    PAF (paroxysmal atrial fibrillation) ([name])    a. on Eliquis; b. CHADS2VASc = 3 (HTN, age x 78, female).   Paroxysmal atrial fibrillation ([place]) 01/24/2015   Right upper quadrant abdominal tenderness without rebound tenderness 03/02/2018   S/P  ascending aortic aneurysm repair 2008    Past Surgical History:  Procedure Laterality Date   ABDOMINAL AORTIC ANEURYSM REPAIR  2008   ABDOMINAL HYSTERECTOMY     AORTIC VALVE REPLACEMENT  2008   CARDIAC CATHETERIZATION     [facility]   CARDIOVERSION N/A 08/15/2018   Procedure: CARDIOVERSION;  Surgeon: [name], MD;  Location: [facility] ORS;  Service: Cardiovascular;  Laterality: N/A;   CARDIOVERSION N/A 11/14/2018   Procedure: CARDIOVERSION (CATH LAB);  Surgeon: [name], MD;  Location: [facility] ORS;  Service: Cardiovascular;  Laterality: N/A;   CARDIOVERSION N/A 06/05/2019   Procedure: CARDIOVERSION;  Surgeon: [name], MD;  Location: [facility] ORS;  Service: Cardiovascular;  Laterality: N/A;   CARDIOVERSION N/A 08/14/2019   Procedure: CARDIOVERSION;  Surgeon: [name], MD;  Location: [place] ORS;  Service: Cardiovascular;  Laterality: N/A;   CARDIOVERSION N/A 11/09/2019   Procedure: CARDIOVERSION;  Surgeon: Isaias Cowman, MD;  Location: [place] ORS;  Service: Cardiovascular;  Laterality: N/A;   CARDIOVERSION N/A 01/17/2020   Procedure: CARDIOVERSION;  Surgeon: Isaias Cowman, MD;  Location: [facility] ORS;  Service: Cardiovascular;  Laterality: N/A;   CARPAL TUNNEL RELEASE     ELECTROPHYSIOLOGIC STUDY N/A 08/17/2016   Procedure: Cardioversion;  Surgeon: [name], MD;  Location: [facility] ORS;  Service: Cardiovascular;  Laterality: N/A;   TUMOR EXCISION Left    x3 (arm)    Medications Prior to Admission  Medication  Sig Dispense Refill Last Dose   ALPRAZolam (XANAX) 0.25 MG tablet Take 1 tablet (0.25 mg total) by mouth 2 (two) times daily as needed for anxiety. 60 tablet 2 11/10/2021 at 1400   amLODipine (NORVASC) 10 MG tablet Take 1 tablet by mouth daily.   11/10/2021 at 0800   apixaban (ELIQUIS) 5 MG TABS tablet Take 1 tablet (5 mg total) by mouth 2 (two) times daily. 180 tablet 2 11/10/2021 at 2000   atorvastatin (LIPITOR) 40 MG tablet 1 tab po qhs 90 tablet 1 11/10/2021 at 2000    bumetanide (BUMEX) 1 MG tablet TAKE 2 TABLETS BY MOUTH TWICE DAILY 360 tablet 3 11/10/2021 at 1400   Calcium Carbonate-Vitamin D (CALCIUM 600+D PO) Take 1 tablet by mouth daily.   11/10/2021 at 0800   carvedilol (COREG) 3.125 MG tablet Take 3.125 mg by mouth 2 (two) times daily with a meal.   11/10/2021 at 1730   Coenzyme Q10 (COQ10) 200 MG CAPS Take 200 mg by mouth daily.   11/10/2021 at 0800   diclofenac Sodium (VOLTAREN) 1 % GEL SMARTSIG:Gram(s) Topical Twice Daily   11/10/2021 at 2000   ferrous sulfate 325 (65 FE) MG tablet Take 2 tablets (650 mg total) by mouth 2 (two) times daily with a meal. (Patient taking differently: Take 325 mg by mouth. Takes 4 tablets by mouth twice a day)  3 11/10/2021 at 2000   flecainide (TAMBOCOR) 50 MG tablet Take 50 mg by mouth 3 (three) times daily.   11/10/2021 at 2000   Fluticasone-Umeclidin-Vilant (TRELEGY ELLIPTA) 100-62.5-25 MCG/ACT AEPB Inhale 1 puff into the lungs daily. 60 each 6 11/10/2021 at 0800   levothyroxine (SYNTHROID) 25 MCG tablet Take 1 tablet (25 mcg total) by mouth daily before breakfast. 90 tablet 1 11/10/2021 at 0800   lisinopril (ZESTRIL) 40 MG tablet TAKE 1 TABLET(40 MG) BY MOUTH DAILY(DOSE INCREASE) 90 tablet 3 11/10/2021 at 0800   loratadine (CLARITIN) 10 MG tablet Take 10 mg by mouth daily.   11/10/2021 at 0800   oxyCODONE-acetaminophen (PERCOCET) 7.5-325 MG tablet Take one tab in am and half in pm for severe shoulder pain 45 tablet 0 11/10/2021 at 2000   pantoprazole (PROTONIX) 40 MG tablet Take 1 tablet (40 mg total) by mouth daily. 90 tablet 1 11/10/2021 at 0800   potassium chloride (KLOR-CON) 10 MEQ tablet Take 4 tablets (40 mEq total) by mouth 2 (two) times daily. 720 tablet 3 11/10/2021 at 2000   sertraline (ZOLOFT) 100 MG tablet Take one tab a day for Anxiety 90 tablet 2 11/10/2021 at 0800   sodium chloride (OCEAN) 0.65 % SOLN nasal spray Place 1 spray into both nostrils as needed for congestion.   11/10/2021 at prn   traZODone (DESYREL) 50 MG tablet Take 1  tablet (50 mg total) by mouth at bedtime. 90 tablet 1 11/10/2021 at 2000   albuterol (VENTOLIN HFA) 108 (90 Base) MCG/ACT inhaler Inhale 2 puffs into the lungs every 6 (six) hours as needed for wheezing or shortness of breath. 18 g 3 prn at unknown   amoxicillin (AMOXIL) 500 MG capsule Take 4 cap po 1 hrs before dental procedure 20 capsule 0 prn at prn   dicyclomine (BENTYL) 10 MG capsule Take 1 capsule (10 mg total) by mouth 3 (three) times daily as needed for spasms. (Patient not taking: Reported on 11/11/2021) 30 capsule 0 Not Taking   flecainide (TAMBOCOR) 50 MG tablet Take 1 tablet (50 mg total) by mouth 2 (two) times daily. PLEASE CONTACT OFFICE  [phone] TO SCHEDULE APPOINTMENT FOR FUTURE REFILLS (Patient not taking: Reported on 11/11/2021) 30 tablet 0 Not Taking   traMADol (ULTRAM) 50 MG tablet Take one tab po bid for pain 60 tablet 1 prn at unknown   Social History   Socioeconomic History   Marital status: Single    Spouse name: Not on file   Number of children: Not on file   Years of education: Not on file   Highest education level: Not on file  Occupational History   Not on file  Tobacco Use   Smoking status: Former    Types: Cigarettes   Smokeless tobacco: Never  Vaping Use   Vaping Use: Never used  Substance and Sexual Activity   Alcohol use: No   Drug use: No   Sexual activity: Never    Birth control/protection: Surgical  Other Topics Concern   Not on file  Social History Narrative   Not on file   Social Determinants of Health   Financial Resource Strain: Low Risk    Difficulty of Paying Living Expenses: Not very hard  Food Insecurity: Not on file  Transportation Needs: Not on file  Physical Activity: Not on file  Stress: Not on file  Social Connections: Not on file  Intimate Partner Violence: Not on file    Family History  Problem Relation Age of Onset   Stroke Father    Stroke Paternal Grandmother       Review of systems complete and found to be  negative unless listed above       Labs:   Lab Results  Component Value Date   WBC 5.1 11/14/2021   HGB 9.9 (L) 11/14/2021   HCT 30.9 (L) 11/14/2021   MCV 91.7 11/14/2021   PLT 235 11/14/2021    Recent Labs  Lab 11/14/21 0411  NA 134*  K 4.4  CL 100  CO2 28  BUN 22  CREATININE 0.87  CALCIUM 8.9  GLUCOSE 98    Lab Results  Component Value Date   CKTOTAL 107 08/14/2012   CKMB 0.7 08/14/2012   TROPONINI <0.03 10/31/2018     Lab Results  Component Value Date   CHOL 142 11/25/2020   CHOL 127 07/17/2019   Lab Results  Component Value Date   HDL 54 11/25/2020   HDL 53 07/17/2019   Lab Results  Component Value Date   LDLCALC 59 11/25/2020   LDLCALC 56 07/17/2019   Lab Results  Component Value Date   TRIG 174 (H) 11/25/2020   TRIG 94 07/17/2019   No results found for: CHOLHDL No results found for: LDLDIRECT    Radiology: DG Chest 2 View  Result Date: 11/11/2021 CLINICAL DATA:  Shortness of breath EXAM: CHEST - 2 VIEW COMPARISON:  10/27/2021, 03/17/2021 FINDINGS: Post sternotomy changes. Left-sided pacing device as before. Mild cardiomegaly. Mild diffuse reticular interstitial opacity likely due to chronic change. Subsegmental atelectasis or scar at the bases. No pleural effusion or confluent airspace disease. Aortic atherosclerosis. IMPRESSION: No active cardiopulmonary disease. Mild cardiomegaly. Chronic interstitial opacities. Electronically Signed   By: Donavan Foil M.D.   On: 11/11/2021 00:18   DG Chest 2 View  Result Date: 10/27/2021 CLINICAL DATA:  Shortness of breath. EXAM: CHEST - 2 VIEW COMPARISON:  10/23/2021 and CT chest 05/11/2020. FINDINGS: Patient is rotated. Trachea is midline. Heart is enlarged, stable. Thoracic aorta is calcified. Pacemaker lead tips are seen in the right atrium and right ventricle. Image quality on the frontal view is degraded by  technique. Mild accentuation of the pulmonary markings, unchanged. No airspace consolidation or  pleural fluid. IMPRESSION: Mild accentuation of the pulmonary markings may be chronic. No airspace consolidation. Electronically Signed   By: Lorin Picket M.D.   On: 10/27/2021 13:09   DG Chest 2 View  Result Date: 10/23/2021 CLINICAL DATA:  Worsening shortness of breath over the last week. EXAM: CHEST - 2 VIEW COMPARISON:  03/17/2021 FINDINGS: Dual lead pacer noted. Aortic valve prosthesis observed with evidence of prior median sternotomy. Atherosclerotic calcification of the aortic arch. Stable mild to moderate enlargement of the cardiopericardial silhouette. Fine reticular interstitial accentuation in the lungs without well-defined Kerley B lines. Mildly indistinct pulmonary vasculature. Right axillary clips noted. No blunting of the costophrenic angles. Thoracic spondylosis noted. Degenerative glenohumeral arthropathy bilaterally. IMPRESSION: 1. Mild indistinctness of the pulmonary vasculature which may be from pulmonary venous hypertension. There is also some fine reticular interstitial accentuation in the lungs but without overt Kerley B lines to further indicate interstitial pulmonary edema. 2. Mild to moderate enlargement of the cardiopericardial silhouette. Dual lead pacer and aortic valve prosthesis noted. 3.  Aortic Atherosclerosis (ICD10-I70.0). Electronically Signed   By: Van Clines M.D.   On: 10/23/2021 17:29   ECHOCARDIOGRAM COMPLETE  Result Date: 11/11/2021    ECHOCARDIOGRAM REPORT   Patient Name:   AYRIANNA MCGINNISS Date of Exam: 11/11/2021 Medical Rec #:  [id number]     Height:       58.0 in Accession #:    [id number]    Weight:       187.6 lb Date of Birth:  06-09-1946    BSA:          1.772 m Patient Age:    45 years      BP:           106/59 mmHg Patient Gender: F             HR:           70 bpm. Exam Location:  [facility] Procedure: 2D Echo, Cardiac Doppler and Color Doppler Indications:     Syncope R55  History:         Patient has prior history of Echocardiogram examinations, most                   recent 08/12/2017. Arrythmias:Atrial Fibrillation; Risk                  Factors:Hypertension. 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  Sonographer:     Sherrie Sport Referring Phys:  [id number] Athena Masse Diagnosing Phys: Donnelly Angelica  Sonographer Comments: No apical window and Technically challenging study due to limited acoustic windows. Image acquisition challenging due to COPD. IMPRESSIONS  1. Left ventricular ejection fraction, by estimation, is 60 to 65%. The left ventricle has normal function. The left ventricle demonstrates regional wall motion abnormalities (see scoring diagram/findings for description).  2. Right ventricular systolic function was not well visualized. The right ventricular size is mildly enlarged.  3. The mitral valve was not well visualized. No evidence of mitral valve regurgitation.  4. The aortic valve was not well visualized. Aortic valve regurgitation is not visualized. Conclusion(s)/Recommendation(s): POOR ACOUSTIC WINDOWS LIMITS ABILITY TO EVALUATE VALVULAR PATHOLOGY AND RV SIZE AND FUNCTION. FINDINGS  Left Ventricle: Left ventricular ejection fraction, by estimation, is 60 to 65%. The left ventricle has normal function. The  left ventricle demonstrates regional wall motion abnormalities. The left ventricular internal cavity size was normal in size. There is no left ventricular hypertrophy. Right Ventricle: The right ventricular size is mildly enlarged. Right vetricular wall thickness was not well visualized. Right ventricular systolic function was not well visualized. Pericardium: There is no evidence of pericardial effusion. Mitral Valve: The mitral valve was not well visualized. No evidence of mitral valve regurgitation. Tricuspid Valve: The tricuspid valve is not well visualized. Aortic Valve: The aortic valve was not well visualized. Aortic valve regurgitation is not visualized. Pulmonic Valve: The pulmonic valve was not well visualized. Venous: The  inferior vena cava was not well visualized.  LEFT VENTRICLE PLAX 2D LVIDd:         3.80 cm LVIDs:         2.50 cm LV PW:         1.30 cm LV IVS:        1.40 cm LVOT diam:     1.90 cm LVOT Area:     2.84 cm  LEFT ATRIUM         Index LA diam:    3.70 cm 2.09 cm/m   AORTA Ao Root diam: 2.30 cm  SHUNTS Systemic Diam: 1.90 cm Donnelly Angelica Electronically signed by Donnelly Angelica Signature Date/Time: 11/11/2021/4:49:01 PM    Final     EKG: AV dual paced rhythm.  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  #HFpEF #Acute on chronic hypoxemic respiratory failure #Morbid obesity Patient is admitted with few days of worsening shortness of breath.  Interestingly her BNP is 170 which is lower than it has been in the past.  She has improved to some degree with an increase in IV Bumex to 1 mg twice daily. - Please check BMP at least daily while diuresing. - Home dose 2 mg PO BID - Consider addition of spironolactone or entresto as outpatient - Continue home BP management.   #Paroxysmal atrial fibrillation Patient underwent a AV node ablation in June 2021 and is now pacemaker dependent. -Continue Eliquis 5 mg twice daily -Continue flecainide 50 mg twice daily -Continue Coreg 3.125 mg twice daily  # Hypertension - Continue lisinopril 40 mg daily  Unable to see patient prior to DC today. Please have patient follow up with Dr. Saralyn Pilar in 1-2 weeks after discharge.   Signed: Andrez Grime MD 11/14/2021, 9:37 AM

## 2021-11-14 NOTE — Progress Notes (Signed)
Taniqua E Glasscock to be D/C'd Home per MD order.  Discussed prescriptions and follow up appointments with the patient. Prescriptions given to patient, medication list explained in detail. Pt verbalized understanding. ? ?Allergies as of 11/14/2021   ? ?   Reactions  ? Benadryl [diphenhydramine Hcl (sleep)] Palpitations  ? Cetirizine Palpitations  ? Lasix [furosemide] Rash  ? Levaquin [levofloxacin In D5w] Other (See Comments)  ? Reaction:  Fatigue and muscle soreness  ? Meloxicam Rash  ? Soy Allergy Hives, Nausea And Vomiting  ? Sulfa Antibiotics Rash  ? ?  ? ?  ?Medication List  ?  ? ?STOP taking these medications   ? ?dicyclomine 10 MG capsule ?Commonly known as: BENTYL ?  ? ?  ? ?TAKE these medications   ? ?albuterol 108 (90 Base) MCG/ACT inhaler ?Commonly known as: Ventolin HFA ?Inhale 2 puffs into the lungs every 6 (six) hours as needed for wheezing or shortness of breath. ?  ?ALPRAZolam 0.25 MG tablet ?Commonly known as: Duanne Moron ?Take 1 tablet (0.25 mg total) by mouth 2 (two) times daily as needed for anxiety. ?  ?amLODipine 10 MG tablet ?Commonly known as: NORVASC ?Take 1 tablet by mouth daily. ?  ?amoxicillin 500 MG capsule ?Commonly known as: AMOXIL ?Take 4 cap po 1 hrs before dental procedure ?  ?amoxicillin-clavulanate 875-125 MG tablet ?Commonly known as: Augmentin ?Take 1 tablet by mouth 2 (two) times daily for 5 days. ?  ?apixaban 5 MG Tabs tablet ?Commonly known as: Eliquis ?Take 1 tablet (5 mg total) by mouth 2 (two) times daily. ?  ?atorvastatin 40 MG tablet ?Commonly known as: LIPITOR ?1 tab po qhs ?  ?bumetanide 1 MG tablet ?Commonly known as: BUMEX ?TAKE 2 TABLETS BY MOUTH TWICE DAILY ?  ?CALCIUM 600+D PO ?Take 1 tablet by mouth daily. ?  ?carvedilol 3.125 MG tablet ?Commonly known as: COREG ?Take 3.125 mg by mouth 2 (two) times daily with a meal. ?  ?CoQ10 200 MG Caps ?Take 200 mg by mouth daily. ?  ?diclofenac Sodium 1 % Gel ?Commonly known as: VOLTAREN ?SMARTSIG:Gram(s) Topical Twice Daily ?  ?ferrous  sulfate 325 (65 FE) MG tablet ?Take 2 tablets (650 mg total) by mouth 2 (two) times daily with a meal. ?What changed:  ?how much to take ?when to take this ?additional instructions ?  ?flecainide 50 MG tablet ?Commonly known as: TAMBOCOR ?Take 1 tablet (50 mg total) by mouth 2 (two) times daily. ?What changed:  ?when to take this ?Another medication with the same name was removed. Continue taking this medication, and follow the directions you see here. ?  ?levothyroxine 25 MCG tablet ?Commonly known as: SYNTHROID ?Take 1 tablet (25 mcg total) by mouth daily before breakfast. ?  ?lisinopril 40 MG tablet ?Commonly known as: ZESTRIL ?TAKE 1 TABLET(40 MG) BY MOUTH DAILY(DOSE INCREASE) ?  ?loratadine 10 MG tablet ?Commonly known as: CLARITIN ?Take 10 mg by mouth daily. ?  ?oxyCODONE-acetaminophen 7.5-325 MG tablet ?Commonly known as: Percocet ?Take one tab in am and half in pm for severe shoulder pain ?  ?pantoprazole 40 MG tablet ?Commonly known as: PROTONIX ?Take 1 tablet (40 mg total) by mouth daily. ?  ?potassium chloride 10 MEQ tablet ?Commonly known as: KLOR-CON ?Take 4 tablets (40 mEq total) by mouth 2 (two) times daily. ?  ?sertraline 100 MG tablet ?Commonly known as: ZOLOFT ?Take one tab a day for Anxiety ?  ?sodium chloride 0.65 % Soln nasal spray ?Commonly known as: OCEAN ?Place 1 spray into  both nostrils as needed for congestion. ?  ?traMADol 50 MG tablet ?Commonly known as: ULTRAM ?Take one tab po bid for pain ?  ?traZODone 50 MG tablet ?Commonly known as: DESYREL ?Take 1 tablet (50 mg total) by mouth at bedtime. ?  ?Trelegy Ellipta 100-62.5-25 MCG/ACT Aepb ?Generic drug: Fluticasone-Umeclidin-Vilant ?Inhale 1 puff into the lungs daily. ?  ? ?  ? ?  ?  ? ? ?  ?Durable Medical Equipment  ?(From admission, onward)  ?  ? ? ?  ? ?  Start     Ordered  ? 11/14/21 1046  For home use only DME oxygen  Once       ?Question Answer Comment  ?Length of Need Lifetime   ?Mode or (Route) Nasal cannula   ?Liters per Minute 4    ?Frequency Continuous (stationary and portable oxygen unit needed)   ?Oxygen conserving device Yes   ?Oxygen delivery system Gas   ?  ? 11/14/21 1046  ? ?  ?  ? ?  ? ? ?Vitals:  ? 11/14/21 1607 11/14/21 1432  ?BP: (!) 144/66 (!) 114/56  ?Pulse: 76 71  ?Resp: 18 19  ?Temp: 98.4 ?F (36.9 ?C) 98 ?F (36.7 ?C)  ?SpO2: 99% 97%  ? ? ?Skin clean, dry and intact without evidence of skin break down, no evidence of skin tears noted. IV catheter discontinued intact. Site without signs and symptoms of complications. Dressing and pressure applied. Pt denies pain at this time. No complaints noted. ? ?An After Visit Summary was printed and given to the patient. ?Patient escorted via [place], and D/C home via private auto. ? ?Rolley Sims  ?

## 2021-11-14 NOTE — Plan of Care (Signed)
°  Problem: Education: °Goal: Ability to demonstrate management of disease process will improve °Outcome: Progressing °Goal: Ability to verbalize understanding of medication therapies will improve °Outcome: Progressing °Goal: Individualized Educational Video(s) °Outcome: Progressing °  °

## 2021-11-17 ENCOUNTER — Telehealth: Payer: Self-pay

## 2021-11-17 NOTE — Telephone Encounter (Signed)
I canceled Ct chest ordered [facility]. Patient had CT chest done on 11/14/21 ordered by Eppie Gibson while inpatient.tat ?

## 2021-11-18 ENCOUNTER — Telehealth: Payer: Self-pay

## 2021-11-18 ENCOUNTER — Other Ambulatory Visit: Payer: Self-pay

## 2021-11-18 DIAGNOSIS — I509 Heart failure, unspecified: Secondary | ICD-10-CM

## 2021-11-18 NOTE — Telephone Encounter (Signed)
Send message to [facility] that we put Home health order  ?

## 2021-11-18 NOTE — Telephone Encounter (Signed)
As per dr Humphrey Rolls add home health referral and left pt to call us back  ?

## 2021-11-19 ENCOUNTER — Telehealth: Payer: Self-pay

## 2021-11-19 DIAGNOSIS — I480 Paroxysmal atrial fibrillation: Secondary | ICD-10-CM | POA: Diagnosis not present

## 2021-11-19 DIAGNOSIS — J449 Chronic obstructive pulmonary disease, unspecified: Secondary | ICD-10-CM | POA: Diagnosis not present

## 2021-11-19 DIAGNOSIS — I350 Nonrheumatic aortic (valve) stenosis: Secondary | ICD-10-CM | POA: Diagnosis not present

## 2021-11-19 DIAGNOSIS — Z9981 Dependence on supplemental oxygen: Secondary | ICD-10-CM | POA: Diagnosis not present

## 2021-11-19 DIAGNOSIS — Z7901 Long term (current) use of anticoagulants: Secondary | ICD-10-CM | POA: Diagnosis not present

## 2021-11-19 DIAGNOSIS — I4892 Unspecified atrial flutter: Secondary | ICD-10-CM | POA: Diagnosis not present

## 2021-11-19 DIAGNOSIS — D649 Anemia, unspecified: Secondary | ICD-10-CM | POA: Diagnosis not present

## 2021-11-19 DIAGNOSIS — I5033 Acute on chronic diastolic (congestive) heart failure: Secondary | ICD-10-CM | POA: Diagnosis not present

## 2021-11-19 DIAGNOSIS — Z7951 Long term (current) use of inhaled steroids: Secondary | ICD-10-CM | POA: Diagnosis not present

## 2021-11-19 DIAGNOSIS — E785 Hyperlipidemia, unspecified: Secondary | ICD-10-CM | POA: Diagnosis not present

## 2021-11-19 DIAGNOSIS — Z6839 Body mass index (BMI) 39.0-39.9, adult: Secondary | ICD-10-CM | POA: Diagnosis not present

## 2021-11-19 DIAGNOSIS — E039 Hypothyroidism, unspecified: Secondary | ICD-10-CM | POA: Diagnosis not present

## 2021-11-19 DIAGNOSIS — I110 Hypertensive heart disease with heart failure: Secondary | ICD-10-CM | POA: Diagnosis not present

## 2021-11-19 DIAGNOSIS — E669 Obesity, unspecified: Secondary | ICD-10-CM | POA: Diagnosis not present

## 2021-11-19 DIAGNOSIS — H9193 Unspecified hearing loss, bilateral: Secondary | ICD-10-CM | POA: Diagnosis not present

## 2021-11-19 DIAGNOSIS — J9611 Chronic respiratory failure with hypoxia: Secondary | ICD-10-CM | POA: Diagnosis not present

## 2021-11-19 NOTE — Telephone Encounter (Signed)
[name]  [id number]) from [facility] called and requested verbal order for skilled nursing for CHF and Pneumonia and [place] to help in Bathing.  I gave approval for verbal order to go ahead. ?

## 2021-11-21 ENCOUNTER — Telehealth: Payer: Self-pay

## 2021-11-21 DIAGNOSIS — G8929 Other chronic pain: Secondary | ICD-10-CM

## 2021-11-21 MED ORDER — OXYCODONE-ACETAMINOPHEN 7.5-325 MG PO TABS: ORAL_TABLET | ORAL | 0 refills | Status: DC

## 2021-11-21 NOTE — Telephone Encounter (Signed)
[facility] called refused for physical therapy [phone]  ?

## 2021-11-21 NOTE — Telephone Encounter (Signed)
error 

## 2021-11-24 ENCOUNTER — Telehealth: Payer: Self-pay

## 2021-11-25 ENCOUNTER — Other Ambulatory Visit: Payer: Self-pay | Admitting: Nurse Practitioner

## 2021-11-25 ENCOUNTER — Inpatient Hospital Stay: Payer: Medicare Other | Admitting: Internal Medicine

## 2021-11-25 DIAGNOSIS — F411 Generalized anxiety disorder: Secondary | ICD-10-CM

## 2021-11-25 MED ORDER — ALPRAZOLAM 0.25 MG PO TABS: 0.2500 mg | ORAL_TABLET | Freq: Two times a day (BID) | ORAL | 0 refills | Status: DC | PRN

## 2021-11-25 NOTE — Telephone Encounter (Signed)
I sent 1 refill. She will see Dr. Humphrey Rolls on 3/27, she can order additional refills at that appt if she feels it is appropriate.

## 2021-11-26 ENCOUNTER — Ambulatory Visit: Payer: Medicare Other | Admitting: Family

## 2021-11-26 NOTE — Telephone Encounter (Signed)
LMOM that 1 refill was sent but additional refills will need to be discussed at her next appt with [facility] ?

## 2021-11-30 ENCOUNTER — Encounter: Payer: Self-pay | Admitting: Nurse Practitioner

## 2021-12-01 ENCOUNTER — Telehealth: Payer: Self-pay

## 2021-12-01 ENCOUNTER — Other Ambulatory Visit: Payer: Self-pay

## 2021-12-01 ENCOUNTER — Encounter: Payer: Self-pay | Admitting: Internal Medicine

## 2021-12-01 ENCOUNTER — Ambulatory Visit: Payer: Medicare Other | Admitting: Internal Medicine

## 2021-12-01 VITALS — BP 108/90 | HR 75 | Temp 98.8°F | Resp 16 | Ht <= 58 in | Wt 189.6 lb

## 2021-12-01 DIAGNOSIS — J9611 Chronic respiratory failure with hypoxia: Secondary | ICD-10-CM | POA: Diagnosis not present

## 2021-12-01 DIAGNOSIS — F321 Major depressive disorder, single episode, moderate: Secondary | ICD-10-CM

## 2021-12-01 DIAGNOSIS — I5032 Chronic diastolic (congestive) heart failure: Secondary | ICD-10-CM

## 2021-12-01 DIAGNOSIS — J449 Chronic obstructive pulmonary disease, unspecified: Secondary | ICD-10-CM | POA: Diagnosis not present

## 2021-12-01 NOTE — Telephone Encounter (Signed)
Community message sent to [name] with [facility] to complete order for NIV machine. 12/01/21 ?

## 2021-12-01 NOTE — Progress Notes (Signed)
[facility] ?[address] ?[place], [place] [address] ? ?Internal MEDICINE  ?Office Visit Note ? ?Patient Name: [name] ? [id number]  ?[id number] ? ?Date of Service: 12/08/2021 ? ? ? ? ?Chief Complaint  ?Patient presents with  ? Hospitalization Follow-up  ? Results  ?  Ct chest  ? Shortness of Breath  ?  Worse with exertion   ? ? ? ?HPI ?Pt is here for recent hospital follow up.  She continues to be in and out of the hospital for ongoing shortness of breath patient has history of chronic respiratory failure with hypoxia, multiple medical problems and complicated history of COPD on chronic ~4L O2, HTN, HLD, CVA, GERD, hypothyroidism, depression, bicuspid AV s/p AVR with bioprosthetic valve, PAF on Eliquis, dCHF, IDA, chronic pain ?Recent CT chest was abnormal and will be reviewed by pulmonary patient also had PFTs done ? ? ?Current Medication: ?Outpatient Encounter Medications as of 12/01/2021  ?Medication Sig Note  ? albuterol (VENTOLIN HFA) 108 (90 Base) MCG/ACT inhaler Inhale 2 puffs into the lungs every 6 (six) hours as needed for wheezing or shortness of breath.   ? ALPRAZolam (XANAX) 0.25 MG tablet Take 1 tablet (0.25 mg total) by mouth 2 (two) times daily as needed for anxiety.   ? amLODipine (NORVASC) 10 MG tablet Take 1 tablet by mouth daily.   ? amoxicillin (AMOXIL) 500 MG capsule Take 4 cap po 1 hrs before dental procedure   ? apixaban (ELIQUIS) 5 MG TABS tablet Take 1 tablet (5 mg total) by mouth 2 (two) times daily.   ? atorvastatin (LIPITOR) 40 MG tablet 1 tab po qhs   ? bumetanide (BUMEX) 1 MG tablet TAKE 2 TABLETS BY MOUTH TWICE DAILY   ? Calcium Carbonate-Vitamin D (CALCIUM 600+D PO) Take 1 tablet by mouth daily.   ? Coenzyme Q10 (COQ10) 200 MG CAPS Take 200 mg by mouth daily.   ? diclofenac Sodium (VOLTAREN) 1 % GEL SMARTSIG:Gram(s) Topical Twice Daily   ? Fluticasone-Umeclidin-Vilant (TRELEGY ELLIPTA) 100-62.5-25 MCG/ACT AEPB Inhale 1 puff into the lungs daily.   ? levothyroxine  (SYNTHROID) 25 MCG tablet Take 1 tablet (25 mcg total) by mouth daily before breakfast.   ? lisinopril (ZESTRIL) 40 MG tablet TAKE 1 TABLET(40 MG) BY MOUTH DAILY(DOSE INCREASE)   ? loratadine (CLARITIN) 10 MG tablet Take 10 mg by mouth daily.   ? oxyCODONE-acetaminophen (PERCOCET) 7.5-325 MG tablet Take one tab in am and half in pm for severe shoulder pain   ? pantoprazole (PROTONIX) 40 MG tablet Take 1 tablet (40 mg total) by mouth daily.   ? potassium chloride (KLOR-CON) 10 MEQ tablet Take 4 tablets (40 mEq total) by mouth 2 (two) times daily.   ? sertraline (ZOLOFT) 100 MG tablet Take one tab a day for Anxiety   ? sodium chloride (OCEAN) 0.65 % SOLN nasal spray Place 1 spray into both nostrils as needed for congestion.   ? traMADol (ULTRAM) 50 MG tablet Take one tab po bid for pain   ? traZODone (DESYREL) 50 MG tablet Take 1 tablet (50 mg total) by mouth at bedtime.   ? [DISCONTINUED] carvedilol (COREG) 3.125 MG tablet Take 3.125 mg by mouth 2 (two) times daily with a meal. 08/04/2021: Patient taking differently per cardiology, 2 tabs twice daily  ? [DISCONTINUED] ferrous sulfate 325 (65 FE) MG tablet Take 2 tablets (650 mg total) by mouth 2 (two) times daily with a meal. (Patient taking differently: Take 325 mg by mouth. Takes 4  tablets by mouth twice a day)   ? [DISCONTINUED] flecainide (TAMBOCOR) 50 MG tablet Take 1 tablet (50 mg total) by mouth 2 (two) times daily.   ? ?No facility-administered encounter medications on file as of 12/01/2021.  ? ? ?Surgical History: ?Past Surgical History:  ?Procedure Laterality Date  ? ABDOMINAL AORTIC ANEURYSM REPAIR  2008  ? ABDOMINAL HYSTERECTOMY    ? AORTIC VALVE REPLACEMENT  2008  ? CARDIAC CATHETERIZATION    ? [place]  ? CARDIOVERSION N/A 08/15/2018  ? Procedure: CARDIOVERSION;  Surgeon: [name], MD;  Location: [facility] ORS;  Service: Cardiovascular;  Laterality: N/A;  ? CARDIOVERSION N/A 11/14/2018  ? Procedure: CARDIOVERSION (CATH LAB);  Surgeon: [name], MD;   Location: [facility];  Service: Cardiovascular;  Laterality: N/A;  ? CARDIOVERSION N/A 06/05/2019  ? Procedure: CARDIOVERSION;  Surgeon: [name], MD;  Location: [facility];  Service: Cardiovascular;  Laterality: N/A;  ? CARDIOVERSION N/A 08/14/2019  ? Procedure: CARDIOVERSION;  Surgeon: [name], MD;  Location: [facility];  Service: Cardiovascular;  Laterality: N/A;  ? CARDIOVERSION N/A 11/09/2019  ? Procedure: CARDIOVERSION;  Surgeon: Isaias Cowman, MD;  Location: [facility];  Service: Cardiovascular;  Laterality: N/A;  ? CARDIOVERSION N/A 01/17/2020  ? Procedure: CARDIOVERSION;  Surgeon: Isaias Cowman, MD;  Location: [facility];  Service: Cardiovascular;  Laterality: N/A;  ? CARPAL TUNNEL RELEASE    ? ELECTROPHYSIOLOGIC STUDY N/A 08/17/2016  ? Procedure: Cardioversion;  Surgeon: [name], MD;  Location: [facility];  Service: Cardiovascular;  Laterality: N/A;  ? TUMOR EXCISION Left   ? x3 (arm)  ? ? ?Medical History: ?Past Medical History:  ?Diagnosis Date  ? 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 a. s/p bioprosthetic valve replacement 2008 at [facility];  b. 01/2015 Echo: EF 60-65%, no rwma, Gr1 DD, mildly dil LA, nl RV fxn.  ? Aspiration pneumonia ([place])   ? Atrial fibrillation with RVR ([place]) 01/11/2015  ? Atrial flutter ([place])   ? a. 08/2016 s/p DCCV.  Remains on flecainide 50 mg bid.  ? Basal skull fracture ([name]) 20 yrs ago  ? CHF (congestive heart failure) ([place])   ? Chronic respiratory failure ([place])   ? COPD (chronic obstructive pulmonary disease) ([place])   ? a. on home O2 at 2L since 2008  ? Deafness in left ear   ? 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 Essential hypertension 01/24/2015  ? History of cardiac cath   ? a. 2008 prior to Aortic aneurysm repair-->nl cors.  ? History of stress test   ? a. 10/2015 MV: no ischemia/infarct.  ? HLD (hyperlipidemia)   ? HTN (hypertension)   ? Hypothyroidism   ? Obesity   ? PAF (paroxysmal atrial fibrillation) ([place])   ? a. on Eliquis; b. CHADS2VASc  = 3 (HTN, age x 1, female).  ? Paroxysmal atrial fibrillation ([place]) 01/24/2015  ? Right upper quadrant abdominal tenderness without rebound tenderness 03/02/2018  ? S/P ascending aortic aneurysm repair 2008  ? ? ?Family History: ?Family History  ?Problem Relation Age of Onset  ? Stroke Father   ? Stroke Paternal Grandmother   ? ? ?Social History  ? ?Socioeconomic History  ? Marital status: Single  ?  Spouse name: Not on file  ? Number of children: Not on file  ? Years of education: Not on file  ? Highest education level: Not on file  ?Occupational History  ? Not on file  ?Tobacco Use  ?  Smoking status: Former  ?  Types: Cigarettes  ? Smokeless tobacco: Never  ?Vaping Use  ? Vaping Use: Never used  ?Substance and Sexual Activity  ? Alcohol use: No  ? Drug use: No  ? Sexual activity: Never  ?  Birth control/protection: Surgical  ?Other Topics Concern  ? Not on file  ?Social History Narrative  ? Not on file  ? ?Social Determinants of Health  ? ?Financial Resource Strain: Low Risk   ? Difficulty of Paying Living Expenses: Not very hard  ?Food Insecurity: Not on file  ?Transportation Needs: Not on file  ?Physical Activity: Not on file  ?Stress: Not on file  ?Social Connections: Not on file  ?Intimate Partner Violence: Not on file  ? ? ? ? ?Review of Systems  ?Constitutional:  Negative for chills, fatigue and unexpected weight change.  ?HENT:  Positive for postnasal drip. Negative for congestion, rhinorrhea, sneezing and sore throat.   ?Eyes:  Negative for redness.  ?Respiratory:  Positive for shortness of breath. Negative for cough and chest tightness.   ?Cardiovascular:  Negative for chest pain and palpitations.  ?Gastrointestinal:  Negative for abdominal pain, constipation, diarrhea, nausea and vomiting.  ?Genitourinary:  Negative for dysuria and frequency.  ?Musculoskeletal:  Negative for arthralgias, back pain, joint swelling and neck pain.  ?Skin:  Negative for rash.  ?Neurological: Negative.  Negative for tremors  and numbness.  ?Hematological:  Negative for adenopathy. Does not bruise/bleed easily.  ?Psychiatric/Behavioral:  Positive for behavioral problems (Depression), dysphoric mood and sleep disturbance. Negativ

## 2021-12-02 ENCOUNTER — Emergency Department: Payer: Medicare Other

## 2021-12-02 ENCOUNTER — Observation Stay
Admission: EM | Admit: 2021-12-02 | Discharge: 2021-12-03 | Disposition: A | Discharge status: Home Health | Payer: Medicare Other | Attending: Internal Medicine | Admitting: Internal Medicine

## 2021-12-02 ENCOUNTER — Other Ambulatory Visit: Payer: Self-pay

## 2021-12-02 DIAGNOSIS — E785 Hyperlipidemia, unspecified: Secondary | ICD-10-CM | POA: Diagnosis present

## 2021-12-02 DIAGNOSIS — E039 Hypothyroidism, unspecified: Secondary | ICD-10-CM | POA: Diagnosis present

## 2021-12-02 DIAGNOSIS — G8929 Other chronic pain: Secondary | ICD-10-CM | POA: Diagnosis present

## 2021-12-02 DIAGNOSIS — Z87891 Personal history of nicotine dependence: Secondary | ICD-10-CM | POA: Insufficient documentation

## 2021-12-02 DIAGNOSIS — R4182 Altered mental status, unspecified: Secondary | ICD-10-CM | POA: Diagnosis not present

## 2021-12-02 DIAGNOSIS — D509 Iron deficiency anemia, unspecified: Secondary | ICD-10-CM | POA: Diagnosis present

## 2021-12-02 DIAGNOSIS — J9621 Acute and chronic respiratory failure with hypoxia: Secondary | ICD-10-CM | POA: Diagnosis not present

## 2021-12-02 DIAGNOSIS — Z20822 Contact with and (suspected) exposure to covid-19: Secondary | ICD-10-CM | POA: Diagnosis not present

## 2021-12-02 DIAGNOSIS — I10 Essential (primary) hypertension: Secondary | ICD-10-CM | POA: Diagnosis present

## 2021-12-02 DIAGNOSIS — G9341 Metabolic encephalopathy: Secondary | ICD-10-CM | POA: Diagnosis present

## 2021-12-02 DIAGNOSIS — F32A Depression, unspecified: Secondary | ICD-10-CM | POA: Diagnosis present

## 2021-12-02 DIAGNOSIS — G9340 Encephalopathy, unspecified: Secondary | ICD-10-CM | POA: Diagnosis not present

## 2021-12-02 DIAGNOSIS — I5032 Chronic diastolic (congestive) heart failure: Secondary | ICD-10-CM | POA: Diagnosis present

## 2021-12-02 DIAGNOSIS — I639 Cerebral infarction, unspecified: Secondary | ICD-10-CM | POA: Diagnosis present

## 2021-12-02 DIAGNOSIS — Z7901 Long term (current) use of anticoagulants: Secondary | ICD-10-CM | POA: Insufficient documentation

## 2021-12-02 DIAGNOSIS — I2510 Atherosclerotic heart disease of native coronary artery without angina pectoris: Secondary | ICD-10-CM | POA: Diagnosis not present

## 2021-12-02 DIAGNOSIS — J449 Chronic obstructive pulmonary disease, unspecified: Secondary | ICD-10-CM | POA: Diagnosis not present

## 2021-12-02 DIAGNOSIS — R0902 Hypoxemia: Secondary | ICD-10-CM | POA: Diagnosis not present

## 2021-12-02 DIAGNOSIS — T40712A Poisoning by cannabis, intentional self-harm, initial encounter: Secondary | ICD-10-CM | POA: Diagnosis present

## 2021-12-02 DIAGNOSIS — I480 Paroxysmal atrial fibrillation: Secondary | ICD-10-CM | POA: Diagnosis not present

## 2021-12-02 DIAGNOSIS — I110 Hypertensive heart disease with heart failure: Secondary | ICD-10-CM | POA: Insufficient documentation

## 2021-12-02 DIAGNOSIS — Z79899 Other long term (current) drug therapy: Secondary | ICD-10-CM | POA: Diagnosis not present

## 2021-12-02 DIAGNOSIS — T50904A Poisoning by unspecified drugs, medicaments and biological substances, undetermined, initial encounter: Secondary | ICD-10-CM

## 2021-12-02 DIAGNOSIS — I509 Heart failure, unspecified: Secondary | ICD-10-CM | POA: Diagnosis not present

## 2021-12-02 DIAGNOSIS — R000 Tachycardia, unspecified: Secondary | ICD-10-CM | POA: Diagnosis not present

## 2021-12-02 LAB — BRAIN NATRIURETIC PEPTIDE: B Natriuretic Peptide: 214.8 pg/mL — ABNORMAL HIGH (ref 0.0–100.0)

## 2021-12-02 LAB — CBC WITH DIFFERENTIAL/PLATELET
Abs Immature Granulocytes: 0.02 10*3/uL (ref 0.00–0.07)
Basophils Absolute: 0.1 10*3/uL (ref 0.0–0.1)
Basophils Relative: 1 %
Eosinophils Absolute: 0.2 10*3/uL (ref 0.0–0.5)
Eosinophils Relative: 3 %
HCT: 33.4 % — ABNORMAL LOW (ref 36.0–46.0)
Hemoglobin: 10.3 g/dL — ABNORMAL LOW (ref 12.0–15.0)
Immature Granulocytes: 0 %
Lymphocytes Relative: 19 %
Lymphs Abs: 1.3 10*3/uL (ref 0.7–4.0)
MCH: 28.6 pg (ref 26.0–34.0)
MCHC: 30.8 g/dL (ref 30.0–36.0)
MCV: 92.8 fL (ref 80.0–100.0)
Monocytes Absolute: 1.1 10*3/uL — ABNORMAL HIGH (ref 0.1–1.0)
Monocytes Relative: 15 %
Neutro Abs: 4.4 10*3/uL (ref 1.7–7.7)
Neutrophils Relative %: 62 %
Platelets: 262 10*3/uL (ref 150–400)
RBC: 3.6 MIL/uL — ABNORMAL LOW (ref 3.87–5.11)
RDW: 15.7 % — ABNORMAL HIGH (ref 11.5–15.5)
WBC: 7 10*3/uL (ref 4.0–10.5)
nRBC: 0 % (ref 0.0–0.2)

## 2021-12-02 LAB — COMPREHENSIVE METABOLIC PANEL
ALT: 13 U/L (ref 0–44)
AST: 23 U/L (ref 15–41)
Albumin: 3.8 g/dL (ref 3.5–5.0)
Alkaline Phosphatase: 71 U/L (ref 38–126)
Anion gap: 10 (ref 5–15)
BUN: 16 mg/dL (ref 8–23)
CO2: 28 mmol/L (ref 22–32)
Calcium: 8.7 mg/dL — ABNORMAL LOW (ref 8.9–10.3)
Chloride: 99 mmol/L (ref 98–111)
Creatinine, Ser: 0.98 mg/dL (ref 0.44–1.00)
GFR, Estimated: >60 mL/min (ref >60)
Glucose, Bld: 119 mg/dL — ABNORMAL HIGH (ref 70–99)
Potassium: 4.1 mmol/L (ref 3.5–5.1)
Sodium: 137 mmol/L (ref 135–145)
Total Bilirubin: 0.7 mg/dL (ref 0.3–1.2)
Total Protein: 6.7 g/dL (ref 6.5–8.1)

## 2021-12-02 LAB — BLOOD GAS, VENOUS
Acid-Base Excess: 1.3 mmol/L (ref 0.0–2.0)
Bicarbonate: 27.2 mmol/L (ref 20.0–28.0)
O2 Saturation: 94.6 %
Patient temperature: 37
pCO2, Ven: 47 mmHg (ref 44–60)
pH, Ven: 7.37 (ref 7.25–7.43)
pO2, Ven: 72 mmHg — ABNORMAL HIGH (ref 32–45)

## 2021-12-02 LAB — ETHANOL: Alcohol, Ethyl (B): <10 mg/dL (ref <10)

## 2021-12-02 LAB — RESP PANEL BY RT-PCR (FLU A&B, COVID) ARPGX2
Influenza A by PCR: NEGATIVE
Influenza B by PCR: NEGATIVE
SARS Coronavirus 2 by RT PCR: NEGATIVE

## 2021-12-02 LAB — CBG MONITORING, ED: Glucose-Capillary: 95 mg/dL (ref 70–99)

## 2021-12-02 LAB — SALICYLATE LEVEL: Salicylate Lvl: <7 mg/dL — ABNORMAL LOW (ref 7.0–30.0)

## 2021-12-02 LAB — ACETAMINOPHEN LEVEL: Acetaminophen (Tylenol), Serum: <10 ug/mL — ABNORMAL LOW (ref 10–30)

## 2021-12-02 MED ORDER — LACTATED RINGERS IV BOLUS
1000.0000 mL | Freq: Once | INTRAVENOUS | Status: AC
  Administered 2021-12-02: 1000 mL via INTRAVENOUS

## 2021-12-02 MED ORDER — BUMETANIDE 1 MG PO TABS
2.0000 mg | ORAL_TABLET | Freq: Two times a day (BID) | ORAL | Status: DC
  Administered 2021-12-02 – 2021-12-03 (×2): 2 mg via ORAL
  Filled 2021-12-02 (×3): qty 2

## 2021-12-02 MED ORDER — ALBUTEROL SULFATE (2.5 MG/3ML) 0.083% IN NEBU: 2.5000 mg | INHALATION_SOLUTION | RESPIRATORY_TRACT | Status: DC | PRN

## 2021-12-02 MED ORDER — LEVOTHYROXINE SODIUM 25 MCG PO TABS
25.0000 ug | ORAL_TABLET | Freq: Every day | ORAL | Status: DC
  Administered 2021-12-03: 25 ug via ORAL
  Filled 2021-12-02: qty 1

## 2021-12-02 MED ORDER — BUMETANIDE 0.25 MG/ML IJ SOLN
1.0000 mg | Freq: Once | INTRAMUSCULAR | Status: AC
  Administered 2021-12-02: 1 mg via INTRAVENOUS
  Filled 2021-12-02: qty 4

## 2021-12-02 MED ORDER — ACETAMINOPHEN 325 MG PO TABS
650.0000 mg | ORAL_TABLET | Freq: Four times a day (QID) | ORAL | Status: DC | PRN
  Filled 2021-12-02: qty 2

## 2021-12-02 MED ORDER — BUMETANIDE 1 MG PO TABS
2.0000 mg | ORAL_TABLET | Freq: Two times a day (BID) | ORAL | Status: DC
  Filled 2021-12-02: qty 2

## 2021-12-02 MED ORDER — ATORVASTATIN CALCIUM 20 MG PO TABS
40.0000 mg | ORAL_TABLET | Freq: Every day | ORAL | Status: DC
Start: 2021-12-02 — End: 2021-12-03
  Administered 2021-12-02 – 2021-12-03 (×2): 40 mg via ORAL
  Filled 2021-12-02 (×2): qty 2

## 2021-12-02 MED ORDER — LORAZEPAM 2 MG/ML IJ SOLN
INTRAMUSCULAR | Status: AC
  Administered 2021-12-02: 0.5 mg via INTRAVENOUS
  Filled 2021-12-02: qty 1

## 2021-12-02 MED ORDER — CARVEDILOL 6.25 MG PO TABS
6.2500 mg | ORAL_TABLET | Freq: Two times a day (BID) | ORAL | Status: DC
  Administered 2021-12-02 – 2021-12-03 (×2): 6.25 mg via ORAL
  Filled 2021-12-02: qty 1

## 2021-12-02 MED ORDER — FLUTICASONE FUROATE-VILANTEROL 100-25 MCG/ACT IN AEPB
1.0000 | INHALATION_SPRAY | Freq: Every day | RESPIRATORY_TRACT | Status: DC
  Administered 2021-12-02: 1 via RESPIRATORY_TRACT
  Filled 2021-12-02: qty 28

## 2021-12-02 MED ORDER — FLECAINIDE ACETATE 50 MG PO TABS
50.0000 mg | ORAL_TABLET | Freq: Two times a day (BID) | ORAL | Status: DC
  Filled 2021-12-02: qty 1

## 2021-12-02 MED ORDER — HYDRALAZINE HCL 20 MG/ML IJ SOLN: 5.0000 mg | INTRAMUSCULAR | Status: DC | PRN

## 2021-12-02 MED ORDER — PANTOPRAZOLE SODIUM 40 MG PO TBEC
40.0000 mg | DELAYED_RELEASE_TABLET | Freq: Every day | ORAL | Status: DC
  Administered 2021-12-02 – 2021-12-03 (×2): 40 mg via ORAL
  Filled 2021-12-02 (×2): qty 1

## 2021-12-02 MED ORDER — DM-GUAIFENESIN ER 30-600 MG PO TB12
1.0000 | ORAL_TABLET | Freq: Two times a day (BID) | ORAL | Status: DC | PRN
  Filled 2021-12-02: qty 1

## 2021-12-02 MED ORDER — AMLODIPINE BESYLATE 5 MG PO TABS: 10.0000 mg | ORAL_TABLET | Freq: Every day | ORAL | Status: DC

## 2021-12-02 MED ORDER — IPRATROPIUM-ALBUTEROL 0.5-2.5 (3) MG/3ML IN SOLN
3.0000 mL | Freq: Four times a day (QID) | RESPIRATORY_TRACT | Status: DC
  Administered 2021-12-02 (×2): 3 mL via RESPIRATORY_TRACT
  Filled 2021-12-02 (×2): qty 3

## 2021-12-02 MED ORDER — ACETAMINOPHEN 650 MG RE SUPP: 650.0000 mg | Freq: Four times a day (QID) | RECTAL | Status: DC | PRN

## 2021-12-02 MED ORDER — FERROUS SULFATE 325 (65 FE) MG PO TABS
325.0000 mg | ORAL_TABLET | Freq: Every day | ORAL | Status: DC
  Administered 2021-12-02 – 2021-12-03 (×2): 325 mg via ORAL
  Filled 2021-12-02 (×2): qty 1

## 2021-12-02 MED ORDER — UMECLIDINIUM BROMIDE 62.5 MCG/ACT IN AEPB
1.0000 | INHALATION_SPRAY | Freq: Every day | RESPIRATORY_TRACT | Status: DC
  Administered 2021-12-02: 1 via RESPIRATORY_TRACT
  Filled 2021-12-02: qty 7

## 2021-12-02 MED ORDER — LISINOPRIL 10 MG PO TABS
40.0000 mg | ORAL_TABLET | Freq: Every day | ORAL | Status: DC
Start: 2021-12-02 — End: 2021-12-02

## 2021-12-02 MED ORDER — LORAZEPAM 2 MG/ML IJ SOLN
0.5000 mg | Freq: Once | INTRAMUSCULAR | Status: AC
Start: 2021-12-02 — End: 2021-12-02
  Administered 2021-12-02: 0.5 mg via INTRAVENOUS
  Filled 2021-12-02: qty 1

## 2021-12-02 MED ORDER — APIXABAN 5 MG PO TABS
5.0000 mg | ORAL_TABLET | Freq: Two times a day (BID) | ORAL | Status: DC
  Administered 2021-12-02 – 2021-12-03 (×3): 5 mg via ORAL
  Filled 2021-12-02 (×3): qty 1

## 2021-12-02 NOTE — ED Notes (Signed)
Cbg 95 ?

## 2021-12-02 NOTE — ED Notes (Signed)
Gina RN CM at bedside ?

## 2021-12-02 NOTE — H&P (Addendum)
?History and Physical  ? ? [name] QQP:[id number] DOB: 1946/08/22 DOA: 12/02/2021 ? ?Referring MD/NP/PA:  ? ?PCP: [name], MD  ? ?Patient coming from:  The patient is coming from home.   ? ?Chief Complaint: AMS ? ?HPI: [name] is a 76 y.o. female with medical history significant of COPD on 2 L oxygen, hypertension, hyperlipidemia, stroke, GERD, hypothyroidism, depression, s/p of repair of aortic valve with bioprosthetic valve due to bicuspid aortic valve, s/p of ascending aorta aneurysm, atrial fibrillation on Eliquis, dCHF, chronic pain, iron deficiency anemia, pacemaker placement, who presents with altered mental status. ? ?Patient has AMS, and is unable to provide accurate medical history, therefore, most of the history is obtained by discussing the case with ED physician, per EMS report, and with the nursing staff. I called her sister and her son by phone, both of them did not know what happened to patient. ? ?Per EDP's note, "according to EMS patient took one of her granddaughters THC Gummies". pt became confused, disoriented, normally, restless, body shaking.  Patient received 0.5 mg of Ativan in the ED.  When I saw patient in ED, patient is confused, arousable, not orientated x3. Currently patient is calm, no body shaking.  She moves all extremities.  No facial droop.  No active respiratory distress, but patient has oxygen desaturation to 80s on home 2 L oxygen, 4 L oxygen started, still has oxygen desaturation on 4L O2, currently patient is put on high flow nasal cannula oxygen.  She does not have active cough, nausea, vomiting, diarrhea.  Does not seem to have pain anywhere.  Not sure if patient has symptoms of UTI.  Patient was reportedly had low blood pressure. ? ?Data Reviewed and ED Course: pt was found to have WBC 7.0, alcohol level less than 10, Tylenol level less than 10, salicylate level less than 7, GFR> 60, temperature normal, blood pressure 124/65, heart rate 87, RR 20, oxygen  saturation 100% on high flow nasal cannula oxygen.  VBG with pH 7.37, CO2 47, O2 72.  Chest x-ray showed mild pulmonary edema.  Patient is placed in PCU for observation. ? ? ?EKG: I have personally reviewed.  Seems to be sinus rhythm, QTc 522, poor R wave progression, widened QRS, I cannot find pacemaker markers clearly ? ? ?Review of Systems: Could not be reviewed due to altered mental status. ? ? ?Allergy:  ?Allergies  ?Allergen Reactions  ? Benadryl [Diphenhydramine Hcl (Sleep)] Palpitations  ? Cetirizine Palpitations  ? Lasix [Furosemide] Rash  ? Levaquin [Levofloxacin In D5w] Other (See Comments)  ?  Reaction:  Fatigue and muscle soreness  ? Meloxicam Rash  ? Soy Allergy Hives and Nausea And Vomiting  ? Sulfa Antibiotics Rash  ? ? ?Past Medical History:  ?Diagnosis Date  ? 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 a. s/p bioprosthetic valve replacement 2008 at [facility];  b. 01/2015 Echo: EF 60-65%, no rwma, Gr1 DD, mildly dil LA, nl RV fxn.  ? Aspiration pneumonia ([place])   ? Atrial fibrillation with RVR ([place]) 01/11/2015  ? Atrial flutter ([place])   ? a. 08/2016 s/p DCCV.  Remains on flecainide 50 mg bid.  ? Basal skull fracture ([name]) 20 yrs ago  ? CHF (congestive heart failure) ([place])   ? Chronic respiratory failure ([place])   ? COPD (chronic obstructive pulmonary disease) ([place])   ? a. on home O2 at 2L since 2008  ? Deafness in left ear   ? partial  deafness in R ear as well  ? Essential hypertension 01/24/2015  ? History of cardiac cath   ? a. 2008 prior to Aortic aneurysm repair-->nl cors.  ? History of stress test   ? a. 10/2015 MV: no ischemia/infarct.  ? HLD (hyperlipidemia)   ? HTN (hypertension)   ? Hypothyroidism   ? Obesity   ? PAF (paroxysmal atrial fibrillation) ([place])   ? a. on Eliquis; b. CHADS2VASc = 3 (HTN, age x 1, female).  ? Paroxysmal atrial fibrillation ([place]) 01/24/2015  ? Right upper quadrant abdominal tenderness without rebound tenderness 03/02/2018  ? S/P ascending aortic aneurysm repair 2008   ? ? ?Past Surgical History:  ?Procedure Laterality Date  ? ABDOMINAL AORTIC ANEURYSM REPAIR  2008  ? ABDOMINAL HYSTERECTOMY    ? AORTIC VALVE REPLACEMENT  2008  ? CARDIAC CATHETERIZATION    ? [place]  ? CARDIOVERSION N/A 08/15/2018  ? Procedure: CARDIOVERSION;  Surgeon: [name], MD;  Location: [facility];  Service: Cardiovascular;  Laterality: N/A;  ? CARDIOVERSION N/A 11/14/2018  ? Procedure: CARDIOVERSION (CATH LAB);  Surgeon: [name], MD;  Location: [facility];  Service: Cardiovascular;  Laterality: N/A;  ? CARDIOVERSION N/A 06/05/2019  ? Procedure: CARDIOVERSION;  Surgeon: [name], MD;  Location: [facility];  Service: Cardiovascular;  Laterality: N/A;  ? CARDIOVERSION N/A 08/14/2019  ? Procedure: CARDIOVERSION;  Surgeon: [name], MD;  Location: [facility];  Service: Cardiovascular;  Laterality: N/A;  ? CARDIOVERSION N/A 11/09/2019  ? Procedure: CARDIOVERSION;  Surgeon: Isaias Cowman, MD;  Location: [facility];  Service: Cardiovascular;  Laterality: N/A;  ? CARDIOVERSION N/A 01/17/2020  ? Procedure: CARDIOVERSION;  Surgeon: Isaias Cowman, MD;  Location: [facility];  Service: Cardiovascular;  Laterality: N/A;  ? CARPAL TUNNEL RELEASE    ? ELECTROPHYSIOLOGIC STUDY N/A 08/17/2016  ? Procedure: Cardioversion;  Surgeon: [name], MD;  Location: [facility];  Service: Cardiovascular;  Laterality: N/A;  ? TUMOR EXCISION Left   ? x3 (arm)  ? ? ?Social History:  reports that she has quit smoking. Her smoking use included cigarettes. She has never used smokeless tobacco. She reports that she does not drink alcohol and does not use drugs. ? ?Family History:  ?Family History  ?Problem Relation Age of Onset  ? Stroke Father   ? Stroke Paternal Grandmother   ?  ? ?Prior to Admission medications   ?Medication Sig Start Date End Date Taking? Authorizing Provider  ?albuterol (VENTOLIN HFA) 108 (90 Base) MCG/ACT inhaler Inhale 2 puffs into the lungs every 6 (six) hours as needed for wheezing or  shortness of breath. 10/27/21   Merlyn Lot, MD  ?ALPRAZolam Duanne Moron) 0.25 MG tablet Take 1 tablet (0.25 mg total) by mouth 2 (two) times daily as needed for anxiety. 11/25/21   Jonetta Osgood, NP  ?amLODipine (NORVASC) 10 MG tablet Take 1 tablet by mouth daily. 07/08/21 07/08/22  [provider]  ?amoxicillin (AMOXIL) 500 MG capsule Take 4 cap po 1 hrs before dental procedure 10/13/21   Jonetta Osgood, NP  ?apixaban (ELIQUIS) 5 MG TABS tablet Take 1 tablet (5 mg total) by mouth 2 (two) times daily. 11/04/21   [name], MD  ?atorvastatin (LIPITOR) 40 MG tablet 1 tab po qhs 10/01/21   [name], MD  ?bumetanide (BUMEX) 1 MG tablet TAKE 2 TABLETS BY MOUTH TWICE DAILY 08/31/21   Alisa Graff, FNP  ?Calcium Carbonate-Vitamin D (CALCIUM 600+D PO) Take 1 tablet by mouth daily.  [provider]  ?carvedilol (COREG) 3.125 MG tablet Take 3.125 mg by mouth 2 (two) times daily with a meal.    [provider]  ?Coenzyme Q10 (COQ10) 200 MG CAPS Take 200 mg by mouth daily.    [provider]  ?diclofenac Sodium (VOLTAREN) 1 % GEL SMARTSIG:Gram(s) Topical Twice Daily 09/09/20   [provider]  ?ferrous sulfate 325 (65 FE) MG tablet Take 2 tablets (650 mg total) by mouth 2 (two) times daily with a meal. ?Patient taking differently: Take 325 mg by mouth. Takes 4 tablets by mouth twice a day 01/30/21   Jennye Boroughs, MD  ?flecainide (TAMBOCOR) 50 MG tablet Take 1 tablet (50 mg total) by mouth 2 (two) times daily. 11/14/21   Wyvonnia Dusky, MD  ?Fluticasone-Umeclidin-Vilant (TRELEGY ELLIPTA) 100-62.5-25 MCG/ACT AEPB Inhale 1 puff into the lungs daily. 10/14/21   Jonetta Osgood, NP  ?levothyroxine (SYNTHROID) 25 MCG tablet Take 1 tablet (25 mcg total) by mouth daily before breakfast. 09/29/21   [name], MD  ?lisinopril (ZESTRIL) 40 MG tablet TAKE 1 TABLET(40 MG) BY MOUTH DAILY(DOSE INCREASE) 09/29/21   Alisa Graff, FNP  ?loratadine (CLARITIN) 10 MG tablet Take 10  mg by mouth daily.    [provider]  ?oxyCODONE-acetaminophen (PERCOCET) 7.5-325 MG tablet Take one tab in am and half in pm for severe shoulder pain 11/21/21   Jonetta Osgood, NP  ?pantopra

## 2021-12-02 NOTE — ED Notes (Signed)
Pt to Radiology via stretcher at this time ?

## 2021-12-02 NOTE — Progress Notes (Signed)
Admission profile updated. ?

## 2021-12-02 NOTE — ED Provider Notes (Signed)
? ?[facility] ?Provider Note ? ? ? Event Date/Time  ? First MD Initiated Contact with Patient 12/02/21 0112   ?  (approximate) ? ? ?History  ? ?Ingestion ? ? ?HPI ? ?[name] is a 76 y.o. female with several comorbidities including COPD on 2 L nasal cannula, CHF, CAD, aortic stenosis status post valve replacement, hypertension, hyperlipidemia, A-fib who presents from home after an ingestion.  According to EMS patient took one of her granddaughters [place]. Patient arrives extremely disoriented, mumbling, having severe tremors. Not answering to questions, not following commands.  ? ?Level 5 caveat:  Portions of the history and physical were unable to be obtained due to AMS ? ?  ? ? ?Past Medical History:  ?Diagnosis Date  ? 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 a. s/p bioprosthetic valve replacement 2008 at [facility];  b. 01/2015 Echo: EF 60-65%, no rwma, Gr1 DD, mildly dil LA, nl RV fxn.  ? Aspiration pneumonia ([place])   ? Atrial fibrillation with RVR ([place]) 01/11/2015  ? Atrial flutter ([place])   ? a. 08/2016 s/p DCCV.  Remains on flecainide 50 mg bid.  ? Basal skull fracture ([name]) 20 yrs ago  ? CHF (congestive heart failure) ([place])   ? Chronic respiratory failure ([place])   ? COPD (chronic obstructive pulmonary disease) ([place])   ? a. on home O2 at 2L since 2008  ? Deafness in left ear   ? 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 Essential hypertension 01/24/2015  ? History of cardiac cath   ? a. 2008 prior to Aortic aneurysm repair-->nl cors.  ? History of stress test   ? a. 10/2015 MV: no ischemia/infarct.  ? HLD (hyperlipidemia)   ? HTN (hypertension)   ? Hypothyroidism   ? Obesity   ? PAF (paroxysmal atrial fibrillation) ([place])   ? a. on Eliquis; b. CHADS2VASc = 3 (HTN, age x 1, female).  ? Paroxysmal atrial fibrillation ([place]) 01/24/2015  ? Right upper quadrant abdominal tenderness without rebound tenderness 03/02/2018  ? S/P ascending aortic aneurysm repair 2008  ? ? ?Past Surgical History:   ?Procedure Laterality Date  ? ABDOMINAL AORTIC ANEURYSM REPAIR  2008  ? ABDOMINAL HYSTERECTOMY    ? AORTIC VALVE REPLACEMENT  2008  ? CARDIAC CATHETERIZATION    ? [place]  ? CARDIOVERSION N/A 08/15/2018  ? Procedure: CARDIOVERSION;  Surgeon: [name], MD;  Location: [facility] ORS;  Service: Cardiovascular;  Laterality: N/A;  ? CARDIOVERSION N/A 11/14/2018  ? Procedure: CARDIOVERSION (CATH LAB);  Surgeon: [name], MD;  Location: [facility] ORS;  Service: Cardiovascular;  Laterality: N/A;  ? CARDIOVERSION N/A 06/05/2019  ? Procedure: CARDIOVERSION;  Surgeon: [name], MD;  Location: [facility] ORS;  Service: Cardiovascular;  Laterality: N/A;  ? CARDIOVERSION N/A 08/14/2019  ? Procedure: CARDIOVERSION;  Surgeon: [name], MD;  Location: [facility] ORS;  Service: Cardiovascular;  Laterality: N/A;  ? CARDIOVERSION N/A 11/09/2019  ? Procedure: CARDIOVERSION;  Surgeon: Isaias Cowman, MD;  Location: [facility] ORS;  Service: Cardiovascular;  Laterality: N/A;  ? CARDIOVERSION N/A 01/17/2020  ? Procedure: CARDIOVERSION;  Surgeon: Isaias Cowman, MD;  Location: [facility] ORS;  Service: Cardiovascular;  Laterality: N/A;  ? CARPAL TUNNEL RELEASE    ? ELECTROPHYSIOLOGIC STUDY N/A 08/17/2016  ? Procedure: Cardioversion;  Surgeon: [name], MD;  Location: [facility] ORS;  Service: Cardiovascular;  Laterality: N/A;  ? TUMOR EXCISION Left   ? x3 (arm)  ? ? ? ?Physical  Exam  ? ?Triage Vital Signs: ?ED Triage Vitals  ?Enc Vitals Group  ?   BP 12/02/21 0117 (!) 115/56  ?   Pulse Rate 12/02/21 0117 87  ?   Resp 12/02/21 0117 16  ?   Temp 12/02/21 0117 97.9 ?F (36.6 ?C)  ?   Temp Source 12/02/21 0117 Oral  ?   SpO2 12/02/21 0117 94 %  ?   Weight 12/02/21 0118 190 lb (86.2 kg)  ?   Height 12/02/21 0118 4\' 10"  (1.473 m)  ?   Head Circumference --   ?   Peak Flow --   ?   Pain Score 12/02/21 0118 0  ?   Pain Loc --   ?   Pain Edu? --   ?   Excl. in [place]? --   ? ? ?Most recent vital signs: ?Vitals:  ? 12/02/21 0117  ?BP: (!) 115/56   ?Pulse: 87  ?Resp: 16  ?Temp: 97.9 ?F (36.6 ?C)  ?SpO2: 94%  ? ? ? ?Constitutional: Awake, shaking uncontrollably, moaning ?HEENT: ?     Head: Normocephalic and atraumatic.    ?     Eyes: Conjunctivae are normal. Sclera is non-icteric.  ?     Mouth/Throat: Mucous membranes are moist.  ?     Neck: Supple with no signs of meningismus. ?Cardiovascular: Regular rate and rhythm. No murmurs, gallops, or rubs. 2+ symmetrical distal pulses are present in all extremities.  ?Respiratory: Hypoxic on 3L, no wheezing or crackles ?Gastrointestinal: Soft, non tender, and non distended with positive bowel sounds. No rebound or guarding. ?Musculoskeletal:  No edema, cyanosis, or erythema of extremities. ?Neurologic: Face is symmetric. Moving all extremities. No gross focal neurologic deficits are appreciated. ?Skin: Skin is warm, dry and intact. No rash noted. ?Psychiatric: Mood and affect are normal. Speech and behavior are normal. ? ?ED Results / Procedures / Treatments  ? ?Labs ?(all labs ordered are listed, but only abnormal results are displayed) ?Labs Reviewed  ?CBC WITH DIFFERENTIAL/PLATELET - Abnormal; Notable for the following components:  ?    Result Value  ? RBC 3.60 (*)   ? Hemoglobin 10.3 (*)   ? HCT 33.4 (*)   ? RDW 15.7 (*)   ? Monocytes Absolute 1.1 (*)   ? All other components within normal limits  ?COMPREHENSIVE METABOLIC PANEL - Abnormal; Notable for the following components:  ? Glucose, Bld 119 (*)   ? Calcium 8.7 (*)   ? All other components within normal limits  ?SALICYLATE LEVEL - Abnormal; Notable for the following components:  ? Salicylate Lvl <9.3 (*)   ? All other components within normal limits  ?ACETAMINOPHEN LEVEL - Abnormal; Notable for the following components:  ? Acetaminophen (Tylenol), Serum <10 (*)   ? All other components within normal limits  ?ETHANOL  ?URINE DRUG SCREEN, QUALITATIVE ([facility] ONLY)  ? ? ? ?EKG ? ?ED ECG REPORT ?I, Rudene Re, the attending physician, personally viewed and  interpreted this ECG. ? ?Sinus rhythm, intraventricular conduction delay, no concordant ST elevations or depression.  Unchanged when compared to prior ? ?RADIOLOGY ?I, Rudene Re, attending MD, have personally viewed and interpreted the images obtained during this visit as below: ? ?Head CT negative ? ?Chest x-ray negative ?___________________________________________________ ?Interpretation by Radiologist:  ?[place] (5MM) ? ?Result Date: 12/02/2021 ?CLINICAL DATA:  Altered mental status following ingestion of Delta 8 gummies, initial encounter EXAM: CT HEAD WITHOUT CONTRAST TECHNIQUE: Contiguous axial images were obtained from the base of  the skull through the vertex without intravenous contrast. RADIATION DOSE REDUCTION: This exam was performed according to the departmental dose-optimization program which includes automated exposure control, adjustment of the mA and/or kV according to patient size and/or use of iterative reconstruction technique. COMPARISON:  01/29/2021 FINDINGS: Brain: No evidence of acute infarction, hemorrhage, hydrocephalus, extra-axial collection or mass lesion/mass effect. Chronic atrophic and ischemic changes are again seen. Vascular: No hyperdense vessel or unexpected calcification. Skull: Normal. Negative for fracture or focal lesion. Sinuses/Orbits: No acute finding. Other: None. IMPRESSION: Chronic atrophic and ischemic changes without acute abnormality. Electronically Signed   By: Inez Catalina M.D.   On: 12/02/2021 02:13  ? ?DG Chest Portable 1 View ? ?Result Date: 12/02/2021 ?CLINICAL DATA:  Hypoxia EXAM: PORTABLE CHEST 1 VIEW COMPARISON:  11/11/2021 FINDINGS: Cardiac shadow is enlarged but stable. Postsurgical changes are noted. Pacing device is again seen. Mild vascular congestion is noted with interstitial edema increased from the prior exam. No focal infiltrate or effusion is seen. No bony abnormality is noted. IMPRESSION: Mild CHF. Electronically Signed   By: Inez Catalina M.D.   On: 12/02/2021 01:56   ? ? ? ?PROCEDURES: ? ?Critical Care performed: Yes, see critical care procedure note(s) ? ?.Critical Care ?Performed by: Rudene Re, MD ?Authorized by: Meliton Rattan

## 2021-12-02 NOTE — ED Notes (Signed)
Pt given remote, repositioned in bed ?

## 2021-12-02 NOTE — ED Notes (Addendum)
Pt sheets changed, new chux applied with purewick repositioned. Pt also given craxckers, and peanut butter ? ?

## 2021-12-02 NOTE — ED Notes (Signed)
WRITER RECEIVED CALL FROM FEMALE WHO STATED SHE WAS PTS GRANDDAUGHTER AND THAT SHE WANTED TO SPEAK WITH HER GRANDMOTHER OR KNOW WHAT WAS GOING ON. FEMALE MADE AWARE THAT WRITER COULD NOT PROVIDE INFORMATION ON PT DUE TO HER NOT LISTED IN CONTACTS FOR PT NOR LISTED AS A DESIGNATED PARTY RELEASE. WRITER INFORMED FEMALE THAT STAFF COULD PROVIDE A MESSAGE FOR PT TO CALL ONCE ABLE.  ?

## 2021-12-02 NOTE — TOC Initial Note (Signed)
Transition of Care (TOC) - Initial/Assessment Note  ? ? ?Patient Details  ?Name: [name] ?MRN: [id number] ?Date of Birth: [birth year] ? ?Transition of Care (TOC) CM/SW Contact:    ?Shelbie Hutching, RN ?Phone Number: ?12/02/2021, 12:38 PM ? ?Clinical Narrative:                 ?Patient placed under observation for metabolic encephalopathy after ingesting some gummies at home.  Patient unable to give details about the gummies says that her granddaughter got them from the grocery store or [place] and they came in a little package and she didn't know how many to take so she took all of them. ?RNCM reached out to patient's son via phone for more information, patient not able to provide at this time. ?Patient lives in an apartment with her granddaughter, Luvenia Starch.  Patient is pretty independent and drives.  She is on chronic oxygen at 4 L.   ?Patient is current with PCP Dr. Humphrey Rolls, and cardiology Dr. Fletcher Anon.   ? ?Patient is open with [facility] for [facility] RN and aide.  Corene Cornea with [facility] is aware of hospitalization.   ? ?TOC will cont to follow.  ? ?Expected Discharge Plan: [place] ?Barriers to Discharge: Continued Medical Work up ? ? ?Patient Goals and CMS Choice ?Patient states their goals for this hospitalization and ongoing recovery are:: patient is unable to states at this time ?CMS Medicare.gov Compare Post Acute Care list provided to:: Patient ?Choice offered to / list presented to : Patient ? ?Expected Discharge Plan and Services ?Expected Discharge Plan: [place] ?  ?Discharge Planning Services: CM Consult ?Post Acute Care Choice: Home Health, Resumption of Svcs/PTA Provider ?Living arrangements for the past 2 months: Apartment ?                ?DME Arranged: N/A ?DME Agency: NA ?  ?  ?  ?HH Arranged: Therapist, sports, Nurse's Aide ?[place] Agency: [facility]) ?Date HH Agency Contacted: 12/02/21 ?Time [place]: [id number] ?Representative spoke with at [place]: Floydene Flock ? ?Prior Living Arrangements/Services ?Living arrangements for the past 2 months: Apartment ?Lives with:: Relatives (granddaughter- Luvenia Starch) ?Patient language and need for interpreter reviewed:: Yes ?Do you feel safe going back to the place where you live?: Yes      ?Need for Family Participation in Patient Care: Yes (Comment) ?Care giver support system in place?: Yes (comment) (son and granddaughter) ?Current home services: DME (oxygen and CPAP) ?Criminal Activity/Legal Involvement Pertinent to Current Situation/Hospitalization: No - Comment as needed ? ?Activities of Daily Living ?Home Assistive Devices/Equipment: Cane (specify quad or straight), Eyeglasses, Hearing aid, Oxygen, CPAP ?ADL Screening (condition at time of admission) ?Patient's cognitive ability adequate to safely complete daily activities?: Yes ?Is the patient deaf or have difficulty hearing?: Yes ?Does the patient have difficulty seeing, even when wearing glasses/contacts?: No ?Does the patient have difficulty concentrating, remembering, or making decisions?: Yes ?Patient able to express need for assistance with ADLs?: No ?Does the patient have difficulty dressing or bathing?: No ?Independently performs ADLs?: No ?Communication: Independent with device (comment) ?Dressing (OT): Needs assistance ?Is this a change from baseline?: Change from baseline, expected to last <3days ?Grooming: Needs assistance ?Is this a change from baseline?: Change from baseline, expected to last <3 days ?Feeding: Independent ?Bathing: Needs assistance ?Is this a change from baseline?: Change from baseline, expected to last <3 days ?Toileting: Needs assistance ?Is this a change from baseline?:  Change from baseline, expected to last <3 days ?In/Out Bed: Needs assistance ?Is this a change from baseline?: Change from baseline, expected to last <3 days ?Walks in Home: Independent with device (comment) ?Does the patient have difficulty walking or climbing stairs?:  Yes ?Weakness of Legs: Both ?Weakness of Arms/Hands: Both ? ?Permission Sought/Granted ?Permission sought to share information with : Case Manager, Family Supports ?Permission granted to share information with : Yes, Verbal Permission Granted ? Share Information with NAME: Ethel Rana ? Permission granted to share info w AGENCY: [facility] ? Permission granted to share info w Relationship: son ? Permission granted to share info w Contact Information: [phone] ? ?Emotional Assessment ?Appearance:: Appears stated age ?Attitude/Demeanor/Rapport: Lethargic, Inconsistent ?Affect (typically observed): Pleasant, Quiet ?Orientation: : Oriented to Self ?Alcohol / Substance Use: Not Applicable ?Psych Involvement: No (comment) ? ?Admission diagnosis:  Acute metabolic encephalopathy [D98.33] ?Patient Active Problem List  ? Diagnosis Date Noted  ? Acute metabolic encephalopathy 82/50/5397  ? Chronic diastolic CHF (congestive heart failure) ([place]) 12/02/2021  ? HLD (hyperlipidemia) 12/02/2021  ? Stroke [facility]) 12/02/2021  ? Iron deficiency anemia 12/02/2021  ? Chronic pain 12/02/2021  ? Depression 12/02/2021  ? CHF exacerbation ([place]) 11/11/2021  ? Acute on chronic respiratory failure with hypoxia ([place]) 11/11/2021  ? Unable to maintain body in lying position 10/16/2021  ? Orthopnea 10/16/2021  ? Class 2 obesity with alveolar hypoventilation, serious comorbidity, and body mass index (BMI) of 39.0 to 39.9 in adult [facility]) 10/16/2021  ? At high risk for postoperative complications 67/34/1937  ? Hearing loss 08/12/2021  ? Long term prescription benzodiazepine use 04/30/2021  ? Chronic shoulder pain (1ry area of Pain) (Bilateral) (L>R) 04/30/2021  ? Chronic upper extremity pain (2ry area of Pain) (Bilateral) (L>R) 04/30/2021  ? Cervicalgia 04/30/2021  ? Chronic neck pain (3ry area of Pain) (Posterior) (Bilateral) (L>R) 04/30/2021  ? Shoulder blade pain (4th area of Pain) (Left) 04/30/2021  ? Osteoarthritis of glenohumeral joint (Left)  04/30/2021  ? Osteoarthritis of AC (acromioclavicular) joint (Left) 04/30/2021  ? Chronic headaches (5th area of Pain) 04/30/2021  ? Osteoarthritis of glenohumeral joints (Bilateral) 04/30/2021  ? Osteoarthritis of acromioclavicular joints (Bilateral) 04/30/2021  ? Primary osteoarthritis of shoulders (Bilateral) 04/30/2021  ? DDD (degenerative disc disease), cervical 04/30/2021  ? Cervical radiculitis (Left) 04/30/2021  ? Cervical radiculopathy (Left) 04/30/2021  ? C6 radiculopathy (Left) 04/30/2021  ? C7 radiculopathy (Left) 04/30/2021  ? Wheelchair dependence 04/30/2021  ? Chronic pain syndrome 04/29/2021  ? Pharmacologic therapy 04/29/2021  ? Disorder of skeletal system 04/29/2021  ? Problems influencing health status 04/29/2021  ? Chronic anticoagulation (Eliquis) 03/14/2021  ? Hx of atrioventricular node ablation 03/14/2021  ? Symptomatic anemia 01/29/2021  ? Acute on chronic diastolic CHF (congestive heart failure) ([place]) 05/11/2020  ? COPD (chronic obstructive pulmonary disease) ([place])   ? Hypothyroidism   ? Cardiac pacemaker in situ 05/10/2020  ? Acute non-recurrent frontal sinusitis 04/22/2020  ? Vertigo 04/22/2020  ? S/P placement of cardiac pacemaker 02/21/2020  ? Atrial fibrillation status post cardioversion [facility]) 11/16/2019  ? Episode of moderate major depression ([place]) 10/10/2019  ? Persistent atrial fibrillation ([place])   ? Encounter for general adult medical examination with abnormal findings 07/10/2019  ? Encounter for screening mammogram for malignant neoplasm of breast 07/10/2019  ? Atopic dermatitis 05/02/2019  ? Paroxysmal atrial flutter ([place]) 08/11/2018  ? Encounter for long-term (current) use of medications 07/09/2018  ? Chronic left shoulder pain 07/09/2018  ? Conjunctivitis 07/09/2018  ? Oxygen dependent 07/09/2018  ?  GAD (generalized anxiety disorder) 07/09/2018  ? Ovarian failure 07/09/2018  ? Positive colorectal cancer screening using Cologuard test 04/24/2018  ? Calculus of gallbladder with  acute on chronic cholecystitis 04/08/2018  ? H/O: CVA (cerebrovascular accident) 04/08/2018  ? Dysuria 03/02/2018  ? Urinary tract infection without hematuria 03/02/2018  ? Right upper quadrant abdominal tendernes

## 2021-12-02 NOTE — ED Triage Notes (Signed)
Pt to room 2 via [facility] with c/o ingestion of 150mg  Delta 8 gummies. Pt grandaughter states pt asked her to buy it for her. Upon arrival to room, pt alert, restless on stretcher, vocal with repetitive statements, involutary shaking movements to bilateral upper and lower extremities. Airway is self maintained, open, and protected at this time. Provider at bedside.   ?

## 2021-12-02 NOTE — Care Management Obs Status (Signed)
MEDICARE OBSERVATION STATUS NOTIFICATION ? ? ?Patient Details  ?Name: [name] ?MRN: [id number] ?Date of Birth: 1946-03-04 ? ? ?Medicare Observation Status Notification Given:  Yes ? ? ? ?Shelbie Hutching, RN ?12/02/2021, 12:31 PM ?

## 2021-12-02 NOTE — ED Notes (Signed)
Patient placed on purewick at this time. New gown placed at this time. ?

## 2021-12-02 NOTE — ED Notes (Signed)
Patient appears to be sleeping at this time. Respirations even and unlabored. NAD noted. ?

## 2021-12-02 NOTE — ED Notes (Signed)
Pt resting in bed on [place] O2. A/ox4, very hard of hearing. Pt states she took her grand daughters marijuana gummies and then shes not sure what happened. Pt states shes feeling better since last noc. VSS. LS dim ?

## 2021-12-02 NOTE — ED Notes (Signed)
WRITER RECEIVED SECOND CALL FROM FEMALE WHO STATED SHE WAS PTS GRANDDAUGHTER AND STS, "I TALKED TO SOMEONE EARLIER AND THEY SAID THEY WOULD TELL MY GRANDMOTHER TO CALL ME. I KNOW IF THEY DID, I WOULD HAVE ALREADY GOT A CALL FROM HER." WRITER INFORMED FEMALE PARTY THAT SHE SPOKE WITH MYSELF AND THAT I STILL COULD NOT PROVIDE INFORMATION ON PT. WRITER ALSO INFORMED FEMALE THAT MESSAGES WOULD NOT BE DELIVERED IF PTS WERE DROWSY, POOR HISTORIANS, NON-VERBAL OR NOT ALERT AND ORIENTED TO SITUATION/PLACE/SELF/TIME. GRANDDAUGHTER STS, "THAT'S FINE, WHEN I COME TO PICK HER UP, I WILL MAKE MY COMPLAINT BECAUSE IF YOU TALKED TO HER, SHE WOULD TELL YOU. SHE WILL BE VERY MAD AND ANGRY ONCE SHE FINDS THIS OUT." WRITER STATED, "THAT IS OKAY AND A MESSAGE WILL BE DELIVERED AS SOON AS PT IS ABLE TO UNDERSTAND. FEMALE HANGS UP PHONE AT THIS TIME.  ?

## 2021-12-03 DIAGNOSIS — J449 Chronic obstructive pulmonary disease, unspecified: Secondary | ICD-10-CM | POA: Diagnosis not present

## 2021-12-03 DIAGNOSIS — I509 Heart failure, unspecified: Secondary | ICD-10-CM | POA: Diagnosis not present

## 2021-12-03 DIAGNOSIS — G9341 Metabolic encephalopathy: Secondary | ICD-10-CM | POA: Diagnosis not present

## 2021-12-03 DIAGNOSIS — I4821 Permanent atrial fibrillation: Secondary | ICD-10-CM | POA: Diagnosis not present

## 2021-12-03 DIAGNOSIS — J9621 Acute and chronic respiratory failure with hypoxia: Secondary | ICD-10-CM | POA: Diagnosis not present

## 2021-12-03 LAB — URINE DRUG SCREEN, QUALITATIVE (ARMC ONLY)
Amphetamines, Ur Screen: NOT DETECTED
Barbiturates, Ur Screen: NOT DETECTED
Benzodiazepine, Ur Scrn: POSITIVE — AB
Cannabinoid 50 Ng, Ur ~~LOC~~: POSITIVE — AB
Cocaine Metabolite,Ur ~~LOC~~: NOT DETECTED
MDMA (Ecstasy)Ur Screen: NOT DETECTED
Methadone Scn, Ur: NOT DETECTED
Opiate, Ur Screen: NOT DETECTED
Phencyclidine (PCP) Ur S: NOT DETECTED
Tricyclic, Ur Screen: NOT DETECTED

## 2021-12-03 LAB — URINALYSIS, COMPLETE (UACMP) WITH MICROSCOPIC
Bacteria, UA: NONE SEEN
Bilirubin Urine: NEGATIVE
Glucose, UA: NEGATIVE mg/dL
Hgb urine dipstick: NEGATIVE
Ketones, ur: NEGATIVE mg/dL
Leukocytes,Ua: NEGATIVE
Nitrite: NEGATIVE
Protein, ur: NEGATIVE mg/dL
Specific Gravity, Urine: 1.005 (ref 1.005–1.030)
pH: 7 (ref 5.0–8.0)

## 2021-12-03 LAB — MAGNESIUM: Magnesium: 2 mg/dL (ref 1.7–2.4)

## 2021-12-03 LAB — GLUCOSE, CAPILLARY: Glucose-Capillary: 106 mg/dL — ABNORMAL HIGH (ref 70–99)

## 2021-12-03 MED ORDER — CARVEDILOL 6.25 MG PO TABS: 6.2500 mg | ORAL_TABLET | Freq: Two times a day (BID) | ORAL | 3 refills | Status: DC

## 2021-12-03 MED ORDER — IPRATROPIUM-ALBUTEROL 0.5-2.5 (3) MG/3ML IN SOLN
3.0000 mL | Freq: Four times a day (QID) | RESPIRATORY_TRACT | Status: DC | PRN
Start: 2021-12-03 — End: 2021-12-03

## 2021-12-03 MED ORDER — FERROUS SULFATE 325 (65 FE) MG PO TABS: 325.0000 mg | ORAL_TABLET | Freq: Every day | ORAL | 3 refills | Status: DC

## 2021-12-03 MED ORDER — DICLOFENAC SODIUM 1 % EX GEL
2.0000 g | Freq: Two times a day (BID) | CUTANEOUS | Status: DC | PRN
  Administered 2021-12-03: 2 g via TOPICAL
  Filled 2021-12-03 (×2): qty 100

## 2021-12-03 NOTE — Hospital Course (Signed)
[name] is a 76 yo female with PMH COPD on chronic ~4L O2, HTN, HLD, CVA, GERD, hypothyroidism, depression, bicuspid AV s/p AVR with bioprosthetic valve, PAF on Eliquis, dCHF, IDA, chronic pain who presented to the hospital with altered mental status.  Patient was unable to provide collateral information on admission and HPI was obtained from chart review and discussions with staff and family. ?It was reported that she had taken THC Gummies from her granddaughter intentionally.  Shortly after this she became confused/disoriented, restless, and shaking with her body.  She was admitted for further evaluation and work-up. ?UDS was not able to be obtained until morning following admission but was positive for cannabinoid, confirming suspicion. ?There was also mild increase of interstitial edema on her CXR as well as mildly elevated BNP.  Cardiology was consulted given recent echo obtained earlier in March as well. ?After evaluation by cardiology, she was considered stable for discharging home. Flecanide was recommended to be discontinued upon discharge. ? ?Patient was strongly encouraged to avoid any further THC/CBD gummy use as well. She voiced understanding to this. ?

## 2021-12-03 NOTE — Discharge Summary (Signed)
?Physician Discharge Summary ?  ?Patient: [name] MRN: [id number] DOB: 04/28/1946  ?Admit date:     12/02/2021  ?Discharge date: 12/03/21  ?Discharge Physician: Dwyane Dee  ? ?PCP: [name], MD  ? ?Recommendations at discharge:  ? ? Follow up with cardiology per regular schedule ? ?Discharge Diagnoses: ?Principal Problem: ?  Acute metabolic encephalopathy ?Active Problems: ?  Acute on chronic respiratory failure with hypoxia ([name]) ?  Essential hypertension ?  Paroxysmal atrial fibrillation ([name]) ?  COPD (chronic obstructive pulmonary disease) ([place]) ?  Hypothyroidism ?  Chronic diastolic CHF (congestive heart failure) ([place]) ?  HLD (hyperlipidemia) ?  Stroke [facility]) ?  Iron deficiency anemia ?  Chronic pain ?  Depression ? ?Resolved Problems: ?  * No resolved hospital problems. * ? 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  Patient was unable to provide collateral information on admission and HPI was obtained from chart review and discussions with staff and family. ?It was reported that she had taken THC Gummies from her granddaughter intentionally.  Shortly after this she became confused/disoriented, restless, and shaking with her body.  She was admitted for further evaluation and work-up. ?UDS was not able to be obtained until morning following admission but was positive for cannabinoid, confirming suspicion. ?There was also mild increase of interstitial edema on her CXR as well as mildly elevated BNP.  Cardiology was consulted given recent echo obtained earlier in March as well. ?After evaluation by cardiology, she was considered stable for discharging home. Flecanide was recommended to be discontinued upon discharge. ? ?Patient was strongly encouraged to avoid any further  THC/CBD gummy use as well. She voiced understanding to this. ? ? ?  ? ? ?Consultants: Cardiology ?Procedures performed:   ?Disposition: Home ?Diet recommendation:  ?Discharge Diet Orders (From admission, onward)  ? ?  Start     Ordered  ? 12/03/21 0000  Diet - low sodium heart healthy       ? 12/03/21 1417  ? ?  ?  ? ?  ? ?Cardiac diet ?DISCHARGE MEDICATION: ?Allergies as of 12/03/2021   ? ?   Reactions  ? Benadryl [diphenhydramine Hcl (sleep)] Palpitations  ? Cetirizine Palpitations  ? Lasix [furosemide] Rash  ? Levaquin [levofloxacin In D5w] Other (See Comments)  ? Reaction:  Fatigue and muscle soreness  ? Meloxicam Rash  ? Soy Allergy Hives, Nausea And Vomiting  ? Sulfa Antibiotics Rash  ? ?  ? ?  ?Medication List  ?  ? ?STOP taking these medications   ? ?flecainide 50 MG tablet ?Commonly known as: TAMBOCOR ?  ? ?  ? ?TAKE these medications   ? ?albuterol 108 (90 Base) MCG/ACT inhaler ?Commonly known as: Ventolin HFA ?Inhale 2 puffs into the lungs every 6 (six) hours as needed for wheezing or shortness of breath. ?  ?ALPRAZolam 0.25 MG tablet ?Commonly known as: Duanne Moron ?Take 1 tablet (0.25 mg total) by mouth 2 (two) times daily as needed for anxiety. ?  ?amLODipine 10 MG tablet ?Commonly known as: NORVASC ?Take 1 tablet by mouth daily. ?  ?amoxicillin 500 MG capsule ?Commonly known as: AMOXIL ?Take 4 cap po 1 hrs before dental procedure ?  ?apixaban 5 MG Tabs tablet ?Commonly known as: Eliquis ?Take 1 tablet (5 mg  total) by mouth 2 (two) times daily. ?  ?atorvastatin 40 MG tablet ?Commonly known as: LIPITOR ?1 tab po qhs ?  ?bumetanide 1 MG tablet ?Commonly known as: BUMEX ?TAKE 2 TABLETS BY MOUTH TWICE DAILY ?  ?CALCIUM 600+D PO ?Take 1 tablet by mouth daily. ?  ?carvedilol 6.25 MG tablet ?Commonly known as: COREG ?Take 1 tablet (6.25 mg total) by mouth 2 (two) times daily with a meal. ?What changed:  ?medication strength ?how much to take ?  ?CoQ10 200 MG Caps ?Take 200 mg by mouth daily. ?  ?diclofenac Sodium 1  % Gel ?Commonly known as: VOLTAREN ?SMARTSIG:Gram(s) Topical Twice Daily ?  ?ferrous sulfate 325 (65 FE) MG tablet ?Take 1 tablet (325 mg total) by mouth daily with breakfast. ?What changed:  ?how much to take ?when to take this ?  ?levothyroxine 25 MCG tablet ?Commonly known as: SYNTHROID ?Take 1 tablet (25 mcg total) by mouth daily before breakfast. ?  ?lisinopril 40 MG tablet ?Commonly known as: ZESTRIL ?TAKE 1 TABLET(40 MG) BY MOUTH DAILY(DOSE INCREASE) ?  ?loratadine 10 MG tablet ?Commonly known as: CLARITIN ?Take 10 mg by mouth daily. ?  ?oxyCODONE-acetaminophen 7.5-325 MG tablet ?Commonly known as: Percocet ?Take one tab in am and half in pm for severe shoulder pain ?  ?pantoprazole 40 MG tablet ?Commonly known as: PROTONIX ?Take 1 tablet (40 mg total) by mouth daily. ?  ?potassium chloride 10 MEQ tablet ?Commonly known as: KLOR-CON ?Take 4 tablets (40 mEq total) by mouth 2 (two) times daily. ?  ?sertraline 100 MG tablet ?Commonly known as: ZOLOFT ?Take one tab a day for Anxiety ?  ?sodium chloride 0.65 % Soln nasal spray ?Commonly known as: OCEAN ?Place 1 spray into both nostrils as needed for congestion. ?  ?traMADol 50 MG tablet ?Commonly known as: ULTRAM ?Take one tab po bid for pain ?  ?traZODone 50 MG tablet ?Commonly known as: DESYREL ?Take 1 tablet (50 mg total) by mouth at bedtime. ?  ?Trelegy Ellipta 100-62.5-25 MCG/ACT Aepb ?Generic drug: Fluticasone-Umeclidin-Vilant ?Inhale 1 puff into the lungs daily. ?  ? ?  ? ? ?Discharge Exam: ?Filed Weights  ? 12/02/21 0118 12/02/21 2029 12/03/21 0500  ?Weight: 86.2 kg 86.4 kg 86.1 kg  ? ?Physical Exam ?Constitutional:   ?   General: She is not in acute distress. ?   Appearance: Normal appearance. She is not ill-appearing.  ?HENT:  ?   Head: Normocephalic and atraumatic.  ?   Mouth/Throat:  ?   Mouth: Mucous membranes are moist.  ?Eyes:  ?   Extraocular Movements: Extraocular movements intact.  ?Cardiovascular:  ?   Rate and Rhythm: Normal rate and regular  rhythm.  ?Pulmonary:  ?   Effort: Pulmonary effort is normal.  ?   Breath sounds: Normal breath sounds.  ?Abdominal:  ?   General: Bowel sounds are normal. There is no distension.  ?   Palpations: Abdomen is soft.  ?   Tenderness: There is no abdominal tenderness.  ?Musculoskeletal:     ?   General: Normal range of motion.  ?   Cervical back: Normal range of motion and neck supple.  ?Skin: ?   General: Skin is warm and dry.  ?Neurological:  ?   General: No focal deficit present.  ?   Mental Status: She is alert and oriented to person, place, and time.  ?Psychiatric:     ?   Mood and Affect: Mood normal.     ?   Behavior:  Behavior normal.  ? ? ? ?Condition at discharge: stable ? ?The results of significant diagnostics from this hospitalization (including imaging, microbiology, ancillary and laboratory) are listed below for reference.  ? ?Imaging Studies: ?DG Chest 2 View ? ?Result Date: 11/11/2021 ?CLINICAL DATA:  Shortness of breath EXAM: CHEST - 2 VIEW COMPARISON:  10/27/2021, 03/17/2021 FINDINGS: Post sternotomy changes. Left-sided pacing device as before. Mild cardiomegaly. Mild diffuse reticular interstitial opacity likely due to chronic change. Subsegmental atelectasis or scar at the bases. No pleural effusion or confluent airspace disease. Aortic atherosclerosis. IMPRESSION: No active cardiopulmonary disease. Mild cardiomegaly. Chronic interstitial opacities. Electronically Signed   By: Donavan Foil M.D.   On: 11/11/2021 00:18  ? ?CT HEAD WO CONTRAST (5MM) ? ?Result Date: 12/02/2021 ?CLINICAL DATA:  Altered mental status following ingestion of Delta 8 gummies, initial encounter EXAM: CT HEAD WITHOUT CONTRAST TECHNIQUE: Contiguous axial images were obtained from the base of the skull through the vertex without intravenous contrast. RADIATION DOSE REDUCTION: This exam was performed according to the departmental dose-optimization program which includes automated exposure control, adjustment of the mA and/or kV  according to patient size and/or use of iterative reconstruction technique. COMPARISON:  01/29/2021 FINDINGS: Brain: No evidence of acute infarction, hemorrhage, hydrocephalus, extra-axial collection or mass le

## 2021-12-03 NOTE — Consult Note (Signed)
?[facility] CONSULT NOTE  ? ?    ?Patient ID: ?[name] ?MRN: [id number] ?DOB/AGE: 75-May-1947 75 y.o. ? ?Admit date: 12/02/2021 ?Referring Physician Dr. Dwyane Dee ?Primary Physician Dr. Clayborn Bigness ?Primary Cardiologist Dr. Saralyn Pilar ?Reason for Consultation CHF, ?abn echo ? ?HPI: [name] is a 76 year old female with history of bioprosthetic aortic valve and aortic root replacement 2008, severe COPD on chronic O2, persistent atrial fibrillation s/p AV node ablation and dual-chamber pacemaker June 6644, chronic diastolic heart failure, hypertension, obesity who presented to [facility] ED 12/02/2021 with acute encephalopathy after a suspected ingestion of one of her granddaughters THC Gummies.  Cardiology is consulted for assistance with her diastolic heart failure and her echo results from her last admission. ? ?The patient was recently admitted to [facility] from 11/10/2021 to 11/14/2021 with shortness of breath 2/2 a CHF exacerbation.  She was treated with IV diuretics and other GDMT with Coreg and lisinopril and was discharged home on Bumex 2 mg twice daily and was reportedly supposed to be seen by Dr. Saralyn Pilar on 3/30. ? ?The patient states today that she has a 46 year old granddaughter that lives with her who wondered if "marijuana would help with the pain" that the patient has chronically.  The patient states she took 1 THC gummy and was "jerking, laughing, rambling and then felt dizzy, weak, could not focus and then thought she was going to die."  She is seen late morning and interview and she thinks she feels mostly back to normal.  She admits to chronic dyspnea on exertion and some abdominal swelling that is no worse than her baseline.  She says she is on 4 L of oxygen by nasal cannula at baseline.  Denies chest pain, palpitations, orthopnea, lower extremity edema.  She admits to compliance with her Bumex and other medications at home. ? ?Vitals are significant for blood pressure of 146/62 heart  rate 72 bpm.  SPO2 95% on 4 L by nasal cannula (on 2 L at home) ? ?Labs on admission are notable for potassium 4.1, creatinine 0.98, GFR greater than 60.  BNP elevated to 214 (was 174 on admission earlier this month), H&H at her baseline of 10/33, platelets 262. ? ?Chest x-ray with mild vascular congestion with increased interstitial edema from 3 weeks ago, c/w mild CHF ? ?Review of systems complete and found to be negative unless listed above  ? ? ? ?Past Medical History:  ?Diagnosis Date  ? 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 a. s/p bioprosthetic valve replacement [facility] at [facility];  b. 01/2015 Echo: EF 60-65%, no rwma, Gr1 DD, mildly dil LA, nl RV fxn.  ? Aspiration pneumonia ([place])   ? Atrial fibrillation with RVR ([place]) 01/11/2015  ? Atrial flutter ([place])   ? a. 08/2016 s/p DCCV.  Remains on flecainide 50 mg bid.  ? Basal skull fracture ([name]) 20 yrs ago  ? CHF (congestive heart failure) ([place])   ? Chronic respiratory failure ([place])   ? COPD (chronic obstructive pulmonary disease) ([place])   ? a. on home O2 at 2L since 2008  ? Deafness in left ear   ? 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 Essential hypertension 01/24/2015  ? History of cardiac cath   ? a. 2008 prior to Aortic aneurysm repair-->nl cors.  ? History of stress test   ? a. 10/2015 MV: no ischemia/infarct.  ? HLD (hyperlipidemia)   ? HTN (hypertension)   ? Hypothyroidism   ? Obesity   ?  PAF (paroxysmal atrial fibrillation) ([place])   ? a. on Eliquis; b. CHADS2VASc = 3 (HTN, age x 1, female).  ? Paroxysmal atrial fibrillation ([place]) 01/24/2015  ? Right upper quadrant abdominal tenderness without rebound tenderness 03/02/2018  ? S/P ascending aortic aneurysm repair 2008  ?  ?Past Surgical History:  ?Procedure Laterality Date  ? ABDOMINAL AORTIC ANEURYSM REPAIR  2008  ? ABDOMINAL HYSTERECTOMY    ? AORTIC VALVE REPLACEMENT  2008  ? CARDIAC CATHETERIZATION    ? [place]  ? CARDIOVERSION N/A 08/15/2018  ? Procedure: CARDIOVERSION;  Surgeon: [name], MD;   Location: [facility] ORS;  Service: Cardiovascular;  Laterality: N/A;  ? CARDIOVERSION N/A 11/14/2018  ? Procedure: CARDIOVERSION (CATH LAB);  Surgeon: [name], MD;  Location: [facility] ORS;  Service: Cardiovascular;  Laterality: N/A;  ? CARDIOVERSION N/A 06/05/2019  ? Procedure: CARDIOVERSION;  Surgeon: [name], MD;  Location: [facility] ORS;  Service: Cardiovascular;  Laterality: N/A;  ? CARDIOVERSION N/A 08/14/2019  ? Procedure: CARDIOVERSION;  Surgeon: [name], MD;  Location: [facility] ORS;  Service: Cardiovascular;  Laterality: N/A;  ? CARDIOVERSION N/A 11/09/2019  ? Procedure: CARDIOVERSION;  Surgeon: Isaias Cowman, MD;  Location: [facility] ORS;  Service: Cardiovascular;  Laterality: N/A;  ? CARDIOVERSION N/A 01/17/2020  ? Procedure: CARDIOVERSION;  Surgeon: Isaias Cowman, MD;  Location: [facility] ORS;  Service: Cardiovascular;  Laterality: N/A;  ? CARPAL TUNNEL RELEASE    ? ELECTROPHYSIOLOGIC STUDY N/A 08/17/2016  ? Procedure: Cardioversion;  Surgeon: [name], MD;  Location: [facility] ORS;  Service: Cardiovascular;  Laterality: N/A;  ? TUMOR EXCISION Left   ? x3 (arm)  ?  ?Medications Prior to Admission  ?Medication Sig Dispense Refill Last Dose  ? albuterol (VENTOLIN HFA) 108 (90 Base) MCG/ACT inhaler Inhale 2 puffs into the lungs every 6 (six) hours as needed for wheezing or shortness of breath. 18 g 3   ? ALPRAZolam (XANAX) 0.25 MG tablet Take 1 tablet (0.25 mg total) by mouth 2 (two) times daily as needed for anxiety. 60 tablet 0   ? amLODipine (NORVASC) 10 MG tablet Take 1 tablet by mouth daily.     ? amoxicillin (AMOXIL) 500 MG capsule Take 4 cap po 1 hrs before dental procedure 20 capsule 0   ? apixaban (ELIQUIS) 5 MG TABS tablet Take 1 tablet (5 mg total) by mouth 2 (two) times daily. 180 tablet 2   ? atorvastatin (LIPITOR) 40 MG tablet 1 tab po qhs 90 tablet 1   ? bumetanide (BUMEX) 1 MG tablet TAKE 2 TABLETS BY MOUTH TWICE DAILY 360 tablet 3   ? Calcium Carbonate-Vitamin D (CALCIUM 600+D  PO) Take 1 tablet by mouth daily.     ? carvedilol (COREG) 3.125 MG tablet Take 3.125 mg by mouth 2 (two) times daily with a meal.     ? Coenzyme Q10 (COQ10) 200 MG CAPS Take 200 mg by mouth daily.     ? diclofenac Sodium (VOLTAREN) 1 % GEL SMARTSIG:Gram(s) Topical Twice Daily     ? ferrous sulfate 325 (65 FE) MG tablet Take 2 tablets (650 mg total) by mouth 2 (two) times daily with a meal. (Patient taking differently: Take 325 mg by mouth. Takes 4 tablets by mouth twice a day)  3   ? flecainide (TAMBOCOR) 50 MG tablet Take 1 tablet (50 mg total) by mouth 2 (two) times daily.     ? Fluticasone-Umeclidin-Vilant (TRELEGY ELLIPTA) 100-62.5-25 MCG/ACT AEPB Inhale 1 puff into the lungs daily. 60 each  6   ? levothyroxine (SYNTHROID) 25 MCG tablet Take 1 tablet (25 mcg total) by mouth daily before breakfast. 90 tablet 1   ? lisinopril (ZESTRIL) 40 MG tablet TAKE 1 TABLET(40 MG) BY MOUTH DAILY(DOSE INCREASE) 90 tablet 3   ? loratadine (CLARITIN) 10 MG tablet Take 10 mg by mouth daily.     ? oxyCODONE-acetaminophen (PERCOCET) 7.5-325 MG tablet Take one tab in am and half in pm for severe shoulder pain 45 tablet 0   ? pantoprazole (PROTONIX) 40 MG tablet Take 1 tablet (40 mg total) by mouth daily. 90 tablet 1   ? potassium chloride (KLOR-CON) 10 MEQ tablet Take 4 tablets (40 mEq total) by mouth 2 (two) times daily. 720 tablet 3   ? sertraline (ZOLOFT) 100 MG tablet Take one tab a day for Anxiety 90 tablet 2   ? sodium chloride (OCEAN) 0.65 % SOLN nasal spray Place 1 spray into both nostrils as needed for congestion.     ? traMADol (ULTRAM) 50 MG tablet Take one tab po bid for pain 60 tablet 1   ? traZODone (DESYREL) 50 MG tablet Take 1 tablet (50 mg total) by mouth at bedtime. 90 tablet 1   ? ?Social History  ? ?Socioeconomic History  ? Marital status: Single  ?  Spouse name: Not on file  ? Number of children: Not on file  ? Years of education: Not on file  ? Highest education level: Not on file  ?Occupational History  ? Not  on file  ?Tobacco Use  ? Smoking status: Former  ?  Types: Cigarettes  ? Smokeless tobacco: Never  ?Vaping Use  ? Vaping Use: Never used  ?Substance and Sexual Activity  ? Alcohol use: No  ? Drug use: No  ?

## 2021-12-03 NOTE — Evaluation (Signed)
Physical Therapy Evaluation ?Patient Details ?Name: [name] ?MRN: [id number] ?DOB: 09/30/1945 ?Today's Date: 12/03/2021 ? ?History of Present Illness ? Per MD notes: Pt is a 76 y.o. female with medical history significant of COPD on 2 L oxygen, hypertension, hyperlipidemia, stroke, GERD, hypothyroidism, depression, s/p of repair of aortic valve with bioprosthetic valve due to bicuspid aortic valve, s/p of ascending aorta aneurysm, atrial fibrillation on Eliquis, dCHF, chronic pain, iron deficiency anemia, pacemaker placement, who presents with altered mental status. MD assessment includes: Acute metabolic encephalopathy, acute on chronic respiratory failure with hypoxia, essential HTN, and iron deficiency anemia. ?  ?Clinical Impression ? Pt was pleasant and motivated to participate during the session and put forth good effort throughout. Pt required no physical assistance during the session but did require min extra time and effort to perform tasks.  Pt was steady with amb with a SPC including while navigating tight spaces but ambulated very slowly with several short standing rest breaks that pt stated is baseline for her.  Pt will benefit from HHPT upon discharge to safely address deficits listed in patient problem list for decreased caregiver assistance and eventual return to PLOF. ? ?   ?   ? ?Recommendations for follow up therapy are one component of a multi-disciplinary discharge planning process, led by the attending physician.  Recommendations may be updated based on patient status, additional functional criteria and insurance authorization. ? ?Follow Up Recommendations Home health PT ? ?  ?Assistance Recommended at Discharge Intermittent Supervision/Assistance  ?Patient can return home with the following ? A little help with walking and/or transfers;A little help with bathing/dressing/bathroom;Assist for transportation;Assistance with cooking/housework ? ?  ?Equipment Recommendations None recommended by  PT  ?Recommendations for Other Services ?    ?  ?Functional Status Assessment Patient has had a recent decline in their functional status and demonstrates the ability to make significant improvements in function in a reasonable and predictable amount of time.  ? ?  ?Precautions / Restrictions Precautions ?Precautions: Fall ?Restrictions ?Weight Bearing Restrictions: No  ? ?  ? ?Mobility ? Bed Mobility ?Overal bed mobility: Modified Independent ?  ?  ?  ?  ?  ?  ?General bed mobility comments: Min extra time and effort only ?  ? ?Transfers ?Overall transfer level: Modified independent ?Equipment used: Straight cane ?  ?  ?  ?  ?  ?  ?  ?General transfer comment: Good stability throughout ?  ? ?Ambulation/Gait ?Ambulation/Gait assistance: Supervision ?Gait Distance (Feet): 20 Feet ?Assistive device: Straight cane ?Gait Pattern/deviations: Step-through pattern, Decreased step length - right, Decreased step length - left ?Gait velocity: decreased ?  ?  ?General Gait Details: Very slow cadence with short B step length but steady throughout including during 180 deg turns and with start/stops; SpO2 and HR WNL on 4L ? ?Stairs ?  ?  ?  ?  ?  ? ?Wheelchair Mobility ?  ? ?Modified Rankin (Stroke Patients Only) ?  ? ?  ? ?Balance Overall balance assessment: Needs assistance ?  ?Sitting balance-Leahy Scale: Normal ?  ?  ?Standing balance support: During functional activity, Single extremity supported ?Standing balance-Leahy Scale: Good ?  ?  ?  ?  ?  ?  ?  ?  ?  ?  ?  ?  ?   ? ? ? ?Pertinent Vitals/Pain Pain Assessment ?Pain Assessment: 0-10 ?Pain Score: 5  ?Pain Location: L shoulder, chronic per patient ?Pain Descriptors / Indicators: Sore ?Pain Intervention(s): Repositioned, Monitored during session  ? ? ?  Home Living Family/patient expects to be discharged to:: Private residence ?Living Arrangements: Other relatives ?Available Help at Discharge: Family;Available PRN/intermittently ?Type of Home: Apartment ?Home Access: Level  entry ?  ?  ?  ?Home Layout: One level ?Home Equipment: Conservation officer, nature (2 wheels);Cane - single point ?Additional Comments: Lives with granddaughter  ?  ?Prior Function   ?  ?  ?  ?  ?  ?  ?Mobility Comments: Mod Ind amb with a SPC mostly household distances or very limited community distances, no fall history ?ADLs Comments: Ind with ADLs ?  ? ? ?Hand Dominance  ? Dominant Hand: Right ? ?  ?Extremity/Trunk Assessment  ? Upper Extremity Assessment ?Upper Extremity Assessment: Generalized weakness ?  ? ?Lower Extremity Assessment ?Lower Extremity Assessment: Generalized weakness ?  ? ?   ?Communication  ? Communication: HOH  ?Cognition Arousal/Alertness: Awake/alert ?Behavior During Therapy: [facility] for tasks assessed/performed ?Overall Cognitive Status: Within Functional Limits for tasks assessed ?  ?  ?  ?  ?  ?  ?  ?  ?  ?  ?  ?  ?  ?  ?  ?  ?  ?  ?  ? ?  ?General Comments   ? ?  ?Exercises    ? ?Assessment/Plan  ?  ?PT Assessment Patient needs continued PT services  ?PT Problem List Decreased strength;Decreased activity tolerance;Decreased balance;Decreased mobility;Decreased knowledge of use of DME ? ?   ?  ?PT Treatment Interventions Gait training;Functional mobility training;Therapeutic activities;Therapeutic exercise;Balance training;Patient/family education;DME instruction   ? ?PT Goals (Current goals can be found in the Care Plan section)  ?Acute Rehab PT Goals ?Patient Stated Goal: to return home ?PT Goal Formulation: With patient ?Time For Goal Achievement: 12/16/21 ?Potential to Achieve Goals: Good ? ?  ?Frequency Min 2X/week ?  ? ? ?Co-evaluation   ?  ?  ?  ?  ? ? ?  ?AM-PAC PT "6 Clicks" Mobility  ?Outcome Measure Help needed turning from your back to your side while in a flat bed without using bedrails?: None ?Help needed moving from lying on your back to sitting on the side of a flat bed without using bedrails?: None ?Help needed moving to and from a bed to a chair (including a wheelchair)?: A  Little ?Help needed standing up from a chair using your arms (e.g., wheelchair or bedside chair)?: None ?Help needed to walk in hospital room?: A Little ?Help needed climbing 3-5 steps with a railing? : A Lot ?6 Click Score: 20 ? ?  ?End of Session Equipment Utilized During Treatment: Gait belt ?Activity Tolerance: Patient tolerated treatment well ?Patient left: in bed;with call bell/phone within reach;with bed alarm set (Pt declined up in chair) ?Nurse Communication: Mobility status ?PT Visit Diagnosis: Muscle weakness (generalized) (M62.81);Difficulty in walking, not elsewhere classified (R26.2) ?  ? ?Time: 6979-4801 ?PT Time Calculation (min) (ACUTE ONLY): 30 min ? ? ?Charges:   PT Evaluation ?$PT Eval Moderate Complexity: 1 Mod ?  ?  ?   ? ?D. Royetta Asal PT, DPT ?12/03/21, 1:43 PM ? ? ?

## 2021-12-05 ENCOUNTER — Telehealth: Payer: Self-pay

## 2021-12-05 NOTE — Telephone Encounter (Signed)
Lvm to schedule hospital follow up-Toni ?

## 2021-12-08 ENCOUNTER — Telehealth: Payer: Self-pay

## 2021-12-08 ENCOUNTER — Ambulatory Visit: Payer: Medicare Other | Admitting: Internal Medicine

## 2021-12-08 ENCOUNTER — Encounter: Payer: Self-pay | Admitting: Internal Medicine

## 2021-12-08 VITALS — BP 106/74 | HR 72 | Temp 98.6°F | Resp 16 | Ht <= 58 in | Wt 187.8 lb

## 2021-12-08 DIAGNOSIS — G4733 Obstructive sleep apnea (adult) (pediatric): Secondary | ICD-10-CM | POA: Diagnosis not present

## 2021-12-08 DIAGNOSIS — J690 Pneumonitis due to inhalation of food and vomit: Secondary | ICD-10-CM

## 2021-12-08 DIAGNOSIS — J9611 Chronic respiratory failure with hypoxia: Secondary | ICD-10-CM

## 2021-12-08 DIAGNOSIS — I5032 Chronic diastolic (congestive) heart failure: Secondary | ICD-10-CM

## 2021-12-08 DIAGNOSIS — J449 Chronic obstructive pulmonary disease, unspecified: Secondary | ICD-10-CM | POA: Diagnosis not present

## 2021-12-08 MED ORDER — BREZTRI AEROSPHERE 160-9-4.8 MCG/ACT IN AERO: 2.0000 | INHALATION_SPRAY | Freq: Two times a day (BID) | RESPIRATORY_TRACT | 11 refills | Status: DC

## 2021-12-08 NOTE — Progress Notes (Signed)
[facility] ?[address] ?[place], [place] [address] ? ?Pulmonary Sleep Medicine  ? ?Office Visit Note ? ?Patient Name: [name] ?DOB: 23-Feb-1946 ?MRN [id number] ? ?Date of Service: 12/08/2021 ? ?Complaints/HPI: SOB. She has been continuing to have excessive Shortness of breath over the course of the last year. Patient is currently on oxygen therapy and is on 4lpm. She gets SOB with exertion. Had an ABG done venous sample with a pO2 of 72 and a pCO2 of 47. She does not have elevated hco3 to indicate chronic CO2 retention. She does have a CPAP which she uses at night. Patient has no weakness of her muscles to suggest a NM process. She did have a valve replacement in 2008 and most recently she had a pacemaker placed. She had PFT done which was not able to complete but the numbers are identical almost to prior test. Test was limited by her ability to complete the test. Patient has no significant cough or sputum production. Patient has not had a recent cardiac cath done. CT scan was done possible aspiration noted. I personally reviewed the images. Patient does have significant cardiomegaly ? ?ROS ? ?General: (-) fever, (-) chills, (-) night sweats, (-) weakness ?Skin: (-) rashes, (-) itching,. ?Eyes: (-) visual changes, (-) redness, (-) itching. ?Nose and Sinuses: (-) nasal stuffiness or itchiness, (-) postnasal drip, (-) nosebleeds, (-) sinus trouble. ?Mouth and Throat: (-) sore throat, (-) hoarseness. ?Neck: (-) swollen glands, (-) enlarged thyroid, (-) neck pain. ?Respiratory: - cough, (-) bloody sputum, + shortness of breath, - wheezing. ?Cardiovascular: - ankle swelling, (-) chest pain. ?Lymphatic: (-) lymph node enlargement. ?Neurologic: (-) numbness, (-) tingling. ?Psychiatric: (-) anxiety, (-) depression ? ? ?Current Medication: ?Outpatient Encounter Medications as of 12/08/2021  ?Medication Sig  ? albuterol (VENTOLIN HFA) 108 (90 Base) MCG/ACT inhaler Inhale 2 puffs into the lungs every 6 (six)  hours as needed for wheezing or shortness of breath.  ? ALPRAZolam (XANAX) 0.25 MG tablet Take 1 tablet (0.25 mg total) by mouth 2 (two) times daily as needed for anxiety.  ? amLODipine (NORVASC) 10 MG tablet Take 1 tablet by mouth daily.  ? amoxicillin (AMOXIL) 500 MG capsule Take 4 cap po 1 hrs before dental procedure  ? apixaban (ELIQUIS) 5 MG TABS tablet Take 1 tablet (5 mg total) by mouth 2 (two) times daily.  ? atorvastatin (LIPITOR) 40 MG tablet 1 tab po qhs  ? bumetanide (BUMEX) 1 MG tablet TAKE 2 TABLETS BY MOUTH TWICE DAILY  ? Calcium Carbonate-Vitamin D (CALCIUM 600+D PO) Take 1 tablet by mouth daily.  ? carvedilol (COREG) 6.25 MG tablet Take 1 tablet (6.25 mg total) by mouth 2 (two) times daily with a meal.  ? Coenzyme Q10 (COQ10) 200 MG CAPS Take 200 mg by mouth daily.  ? diclofenac Sodium (VOLTAREN) 1 % GEL SMARTSIG:Gram(s) Topical Twice Daily  ? ferrous sulfate 325 (65 FE) MG tablet Take 1 tablet (325 mg total) by mouth daily with breakfast.  ? Fluticasone-Umeclidin-Vilant (TRELEGY ELLIPTA) 100-62.5-25 MCG/ACT AEPB Inhale 1 puff into the lungs daily.  ? levothyroxine (SYNTHROID) 25 MCG tablet Take 1 tablet (25 mcg total) by mouth daily before breakfast.  ? lisinopril (ZESTRIL) 40 MG tablet TAKE 1 TABLET(40 MG) BY MOUTH DAILY(DOSE INCREASE)  ? loratadine (CLARITIN) 10 MG tablet Take 10 mg by mouth daily.  ? oxyCODONE-acetaminophen (PERCOCET) 7.5-325 MG tablet Take one tab in am and half in pm for severe shoulder pain  ? pantoprazole (PROTONIX) 40 MG tablet  Take 1 tablet (40 mg total) by mouth daily.  ? potassium chloride (KLOR-CON) 10 MEQ tablet Take 4 tablets (40 mEq total) by mouth 2 (two) times daily.  ? sertraline (ZOLOFT) 100 MG tablet Take one tab a day for Anxiety  ? sodium chloride (OCEAN) 0.65 % SOLN nasal spray Place 1 spray into both nostrils as needed for congestion.  ? traMADol (ULTRAM) 50 MG tablet Take one tab po bid for pain  ? traZODone (DESYREL) 50 MG tablet Take 1 tablet (50 mg  total) by mouth at bedtime.  ? ?No facility-administered encounter medications on file as of 12/08/2021.  ? ? ?Surgical History: ?Past Surgical History:  ?Procedure Laterality Date  ? ABDOMINAL AORTIC ANEURYSM REPAIR  2008  ? ABDOMINAL HYSTERECTOMY    ? AORTIC VALVE REPLACEMENT  2008  ? CARDIAC CATHETERIZATION    ? [place]  ? CARDIOVERSION N/A 08/15/2018  ? Procedure: CARDIOVERSION;  Surgeon: [name], MD;  Location: [facility] OR[facility];  Service: Cardiovascular;  Laterality: N/A;  ? CARDIOVERSION N/A 11/14/2018  ? Procedure: CARDIOVERSION (CATH LAB);  Surgeon: [name], MD;  Location: [facility];  Service: Cardiovascular;  Laterality: N/A;  ? CARDIOVERSION N/A 06/05/2019  ? Procedure: CARDIOVERSION;  Surgeon: [name], MD;  Location: [facility];  Service: Cardiovascular;  Laterality: N/A;  ? CARDIOVERSION N/A 08/14/2019  ? Procedure: CARDIOVERSION;  Surgeon: [name], MD;  Location: [facility];  Service: Cardiovascular;  Laterality: N/A;  ? CARDIOVERSION N/A 11/09/2019  ? Procedure: CARDIOVERSION;  Surgeon: Isaias Cowman, MD;  Location: [facility];  Service: Cardiovascular;  Laterality: N/A;  ? CARDIOVERSION N/A 01/17/2020  ? Procedure: CARDIOVERSION;  Surgeon: Isaias Cowman, MD;  Location: [facility];  Service: Cardiovascular;  Laterality: N/A;  ? CARPAL TUNNEL RELEASE    ? ELECTROPHYSIOLOGIC STUDY N/A 08/17/2016  ? Procedure: Cardioversion;  Surgeon: [name], MD;  Location: [facility];  Service: Cardiovascular;  Laterality: N/A;  ? TUMOR EXCISION Left   ? x3 (arm)  ? ? ?Medical History: ?Past Medical History:  ?Diagnosis Date  ? 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 a. s/p bioprosthetic valve replacement 2008 [facility];  b. 01/2015 Echo: EF 60-65%, no rwma, Gr1 DD, mildly dil LA, nl RV fxn.  ? Aspiration pneumonia ([place])   ? Atrial fibrillation with RVR ([place]) 01/11/2015  ? Atrial flutter ([place])   ? a. 08/2016 s/p DCCV.  Remains on flecainide 50 mg bid.  ? Basal skull fracture ([name]) 20  yrs ago  ? CHF (congestive heart failure) ([place])   ? Chronic respiratory failure ([place])   ? COPD (chronic obstructive pulmonary disease) ([place])   ? a. on home O2 at 2L since 2008  ? Deafness in left ear   ? 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 Essential hypertension 01/24/2015  ? History of cardiac cath   ? a. 2008 prior to Aortic aneurysm repair-->nl cors.  ? History of stress test   ? a. 10/2015 MV: no ischemia/infarct.  ? HLD (hyperlipidemia)   ? HTN (hypertension)   ? Hypothyroidism   ? Obesity   ? PAF (paroxysmal atrial fibrillation) ([place])   ? a. on Eliquis; b. CHADS2VASc = 3 (HTN, age x 1, female).  ? Paroxysmal atrial fibrillation ([place]) 01/24/2015  ? Right upper quadrant abdominal tenderness without rebound tenderness 03/02/2018  ? S/P ascending aortic aneurysm repair 2008  ? ? ?Family History: ?Family History  ?Problem Relation Age of Onset  ? Stroke Father   ?  Stroke Paternal Grandmother   ? ? ?Social History: ?Social History  ? ?Socioeconomic History  ? Marital status: Single  ?  Spouse name: Not on file  ? Number of children: Not on file  ? Years of education: Not on file  ? Highest education level: Not on file  ?Occupational History  ? Not on file  ?Tobacco Use  ? Smoking status: Former  ?  Types: Cigarettes  ? Smokeless tobacco: Never  ?Vaping Use  ? Vaping Use: Never used  ?Substance and Sexual Activity  ? Alcohol use: No  ? Drug use: No  ? Sexual activity: Never  ?  Birth control/protection: Surgical  ?Other Topics Concern  ? Not on file  ?Social History Narrative  ? Not on file  ? ?Social Determinants of Health  ? ?Financial Resource Strain: Low Risk   ? Difficulty of Paying Living Expenses: Not very hard  ?Food Insecurity: Not on file  ?Transportation Needs: Not on file  ?Physical Activity: Not on file  ?Stress: Not on file  ?Social Connections: Not on file  ?Intimate Partner Violence: Not on file  ? ? ?Vital Signs: ?Blood pressure 106/74, pulse 72, temperature 98.6 ?F (37 ?C), resp. rate 16, height 4'  10" (1.473 m), weight 187 lb 12.8 oz (85.2 kg), SpO2 91 %. ? ?Examination: ?General Appearance: The patient is well-developed, well-nourished, and in no distress. ?Skin: Gross inspection of skin Allene Pyo

## 2021-12-08 NOTE — Patient Instructions (Signed)

## 2021-12-08 NOTE — Telephone Encounter (Signed)
Lvm to confirm today's appointment-Toni ?

## 2021-12-08 NOTE — Telephone Encounter (Signed)
Routine chest CT ordered. Printed. Gave to [place]-Toni ?

## 2021-12-08 NOTE — Progress Notes (Signed)
Per DSK 6 min walk ordered and pt was unable to do testing.  Pt walked a few steps while on 3 lts of oxygen and got dizzy and also oxygen sats dropped.  Pt had to sit down and was not able to complete the 6 min walk ?

## 2021-12-09 ENCOUNTER — Telehealth: Payer: Self-pay

## 2021-12-09 NOTE — Telephone Encounter (Signed)
Faxed order for NIV to [facility] per [facility]FK. ?

## 2021-12-11 ENCOUNTER — Other Ambulatory Visit: Payer: Self-pay | Admitting: Internal Medicine

## 2021-12-11 DIAGNOSIS — J449 Chronic obstructive pulmonary disease, unspecified: Secondary | ICD-10-CM

## 2021-12-11 DIAGNOSIS — I4891 Unspecified atrial fibrillation: Secondary | ICD-10-CM | POA: Diagnosis not present

## 2021-12-11 DIAGNOSIS — Q231 Congenital insufficiency of aortic valve: Secondary | ICD-10-CM | POA: Diagnosis not present

## 2021-12-11 DIAGNOSIS — Z7901 Long term (current) use of anticoagulants: Secondary | ICD-10-CM | POA: Diagnosis not present

## 2021-12-11 DIAGNOSIS — Z950 Presence of cardiac pacemaker: Secondary | ICD-10-CM | POA: Diagnosis not present

## 2021-12-16 ENCOUNTER — Inpatient Hospital Stay: Payer: Medicare Other | Admitting: Internal Medicine

## 2021-12-19 ENCOUNTER — Telehealth: Payer: Self-pay

## 2021-12-19 DIAGNOSIS — Z6839 Body mass index (BMI) 39.0-39.9, adult: Secondary | ICD-10-CM | POA: Diagnosis not present

## 2021-12-19 DIAGNOSIS — D649 Anemia, unspecified: Secondary | ICD-10-CM | POA: Diagnosis not present

## 2021-12-19 DIAGNOSIS — E669 Obesity, unspecified: Secondary | ICD-10-CM | POA: Diagnosis not present

## 2021-12-19 DIAGNOSIS — I480 Paroxysmal atrial fibrillation: Secondary | ICD-10-CM | POA: Diagnosis not present

## 2021-12-19 DIAGNOSIS — J449 Chronic obstructive pulmonary disease, unspecified: Secondary | ICD-10-CM | POA: Diagnosis not present

## 2021-12-19 DIAGNOSIS — I4892 Unspecified atrial flutter: Secondary | ICD-10-CM | POA: Diagnosis not present

## 2021-12-19 DIAGNOSIS — Z9981 Dependence on supplemental oxygen: Secondary | ICD-10-CM | POA: Diagnosis not present

## 2021-12-19 DIAGNOSIS — Z7951 Long term (current) use of inhaled steroids: Secondary | ICD-10-CM | POA: Diagnosis not present

## 2021-12-19 DIAGNOSIS — J9611 Chronic respiratory failure with hypoxia: Secondary | ICD-10-CM | POA: Diagnosis not present

## 2021-12-19 DIAGNOSIS — E039 Hypothyroidism, unspecified: Secondary | ICD-10-CM | POA: Diagnosis not present

## 2021-12-19 DIAGNOSIS — I5033 Acute on chronic diastolic (congestive) heart failure: Secondary | ICD-10-CM | POA: Diagnosis not present

## 2021-12-19 DIAGNOSIS — Z7901 Long term (current) use of anticoagulants: Secondary | ICD-10-CM | POA: Diagnosis not present

## 2021-12-19 DIAGNOSIS — H9193 Unspecified hearing loss, bilateral: Secondary | ICD-10-CM | POA: Diagnosis not present

## 2021-12-19 DIAGNOSIS — I350 Nonrheumatic aortic (valve) stenosis: Secondary | ICD-10-CM | POA: Diagnosis not present

## 2021-12-19 DIAGNOSIS — I110 Hypertensive heart disease with heart failure: Secondary | ICD-10-CM | POA: Diagnosis not present

## 2021-12-19 DIAGNOSIS — E785 Hyperlipidemia, unspecified: Secondary | ICD-10-CM | POA: Diagnosis not present

## 2021-12-19 NOTE — Telephone Encounter (Signed)
Pt called and left message on on call VM stating that she thought [name] had changed her Zoloft to 100 mg from 50 mg.  Pt stated that the pharmacy would not fill he medication and she was out.  I called [facility] and spoke to the pharmacist and he verified that they received the rx for sertraline 100 mg for 90 days and patient picked up the prescription on November 05, 2021.  I called pt back and asked her what she had on the bottle and she advised that it was 50 mg sertraline once a day and was filled on 10/12/21.  I told patient that pharmacy told me that she picked up the prescription for the 100 mg and asked did she remember doing so.  Pt advised she had a bag with some medication in it and I told her to go check it and see if the sertraline was in the bag.  Pt came back on the phone and informed me that she did have the sertraline 100 mg for 90 tablets with 2 refills and that she was good now.   ?

## 2021-12-23 ENCOUNTER — Telehealth: Payer: Self-pay

## 2021-12-23 ENCOUNTER — Encounter: Payer: Self-pay | Admitting: Internal Medicine

## 2021-12-23 ENCOUNTER — Ambulatory Visit: Payer: Medicare Other | Admitting: Internal Medicine

## 2021-12-23 ENCOUNTER — Other Ambulatory Visit: Payer: Self-pay | Admitting: Nurse Practitioner

## 2021-12-23 VITALS — BP 134/76 | HR 74 | Temp 98.4°F | Resp 16 | Ht <= 58 in | Wt 186.8 lb

## 2021-12-23 DIAGNOSIS — J849 Interstitial pulmonary disease, unspecified: Secondary | ICD-10-CM

## 2021-12-23 DIAGNOSIS — I517 Cardiomegaly: Secondary | ICD-10-CM

## 2021-12-23 DIAGNOSIS — J449 Chronic obstructive pulmonary disease, unspecified: Secondary | ICD-10-CM

## 2021-12-23 DIAGNOSIS — Z7189 Other specified counseling: Secondary | ICD-10-CM

## 2021-12-23 DIAGNOSIS — G4733 Obstructive sleep apnea (adult) (pediatric): Secondary | ICD-10-CM

## 2021-12-23 DIAGNOSIS — J4489 Other specified chronic obstructive pulmonary disease: Secondary | ICD-10-CM

## 2021-12-23 DIAGNOSIS — G8929 Other chronic pain: Secondary | ICD-10-CM

## 2021-12-23 MED ORDER — OXYCODONE-ACETAMINOPHEN 7.5-325 MG PO TABS: ORAL_TABLET | ORAL | 0 refills | Status: DC

## 2021-12-23 NOTE — Progress Notes (Signed)
[facility] ?[address] ?[place], [place] [address] ? ?Pulmonary Sleep Medicine  ? ?Office Visit Note ? ?Patient Name: [name] ?DOB: [birth year] ?MRN [id number] ? ?Date of Service: 12/23/2021 ? ?Complaints/HPI: CHF COPD A FIB OXYGEN DEPENDENT. RV enlargement.  I had referred the patient over to cardiology for the possibility of a right and left heart catheterization.  The patient was seen by cardiology and currently not recommended for any right left heart catheterization.  Patient still does have significant shortness of breath.  And so therefore I have asked for CT scan of the chest to be followed up.  In addition as far as her shortness of breath is concerned out of proportion to the severity of her pulmonary disease.  I suspect that she has some other cardiopulmonary issues that need to be addressed.  Because we are limited in what we are able to do here I would recommend her to the seen in a tertiary level center.  She may require further intervention with lung biopsy if the CT scan still shows presence of infiltrate. ? ?ROS ? ?General: (-) fever, (-) chills, (-) night sweats, (-) weakness ?Skin: (-) rashes, (-) itching,. ?Eyes: (-) visual changes, (-) redness, (-) itching. ?Nose and Sinuses: (-) nasal stuffiness or itchiness, (-) postnasal drip, (-) nosebleeds, (-) sinus trouble. ?Mouth and Throat: (-) sore throat, (-) hoarseness. ?Neck: (-) swollen glands, (-) enlarged thyroid, (-) neck pain. ?Respiratory: - cough, (-) bloody sputum, + shortness of breath, - wheezing. ?Cardiovascular: - ankle swelling, (-) chest pain. ?Lymphatic: (-) lymph node enlargement. ?Neurologic: (-) numbness, (-) tingling. ?Psychiatric: (-) anxiety, (-) depression ? ? ?Current Medication: ?Outpatient Encounter Medications as of 12/23/2021  ?Medication Sig  ? albuterol (VENTOLIN HFA) 108 (90 Base) MCG/ACT inhaler Inhale 2 puffs into the lungs every 6 (six) hours as needed for wheezing or shortness of breath.  ?  ALPRAZolam (XANAX) 0.25 MG tablet Take 1 tablet (0.25 mg total) by mouth 2 (two) times daily as needed for anxiety.  ? amLODipine (NORVASC) 10 MG tablet Take 1 tablet by mouth daily.  ? amoxicillin (AMOXIL) 500 MG capsule Take 4 cap po 1 hrs before dental procedure  ? apixaban (ELIQUIS) 5 MG TABS tablet Take 1 tablet (5 mg total) by mouth 2 (two) times daily.  ? atorvastatin (LIPITOR) 40 MG tablet 1 tab po qhs  ? Budeson-Glycopyrrol-Formoterol (BREZTRI AEROSPHERE) 160-9-4.8 MCG/ACT AERO Inhale 2 puffs into the lungs 2 (two) times daily.  ? bumetanide (BUMEX) 1 MG tablet TAKE 2 TABLETS BY MOUTH TWICE DAILY  ? Calcium Carbonate-Vitamin D (CALCIUM 600+D PO) Take 1 tablet by mouth daily.  ? carvedilol (COREG) 6.25 MG tablet Take 1 tablet (6.25 mg total) by mouth 2 (two) times daily with a meal.  ? Coenzyme Q10 (COQ10) 200 MG CAPS Take 200 mg by mouth daily.  ? diclofenac Sodium (VOLTAREN) 1 % GEL SMARTSIG:Gram(s) Topical Twice Daily  ? ferrous sulfate 325 (65 FE) MG tablet Take 1 tablet (325 mg total) by mouth daily with breakfast.  ? levothyroxine (SYNTHROID) 25 MCG tablet Take 1 tablet (25 mcg total) by mouth daily before breakfast.  ? lisinopril (ZESTRIL) 40 MG tablet TAKE 1 TABLET(40 MG) BY MOUTH DAILY(DOSE INCREASE)  ? loratadine (CLARITIN) 10 MG tablet Take 10 mg by mouth daily.  ? oxyCODONE-acetaminophen (PERCOCET) 7.5-325 MG tablet Take one tab in am and half in pm for severe shoulder pain  ? pantoprazole (PROTONIX) 40 MG tablet Take 1 tablet (40 mg total) by mouth  daily.  ? potassium chloride (KLOR-CON) 10 MEQ tablet Take 4 tablets (40 mEq total) by mouth 2 (two) times daily.  ? sertraline (ZOLOFT) 100 MG tablet Take one tab a day for Anxiety  ? sodium chloride (OCEAN) 0.65 % SOLN nasal spray Place 1 spray into both nostrils as needed for congestion.  ? traMADol (ULTRAM) 50 MG tablet Take one tab po bid for pain  ? traZODone (DESYREL) 50 MG tablet Take 1 tablet (50 mg total) by mouth at bedtime.  ? ?No  facility-administered encounter medications on file as of 12/23/2021.  ? ? ?Surgical History: ?Past Surgical History:  ?Procedure Laterality Date  ? ABDOMINAL AORTIC ANEURYSM REPAIR  2008  ? ABDOMINAL HYSTERECTOMY    ? AORTIC VALVE REPLACEMENT  2008  ? CARDIAC CATHETERIZATION    ? [place]  ? CARDIOVERSION N/A 08/15/2018  ? Procedure: CARDIOVERSION;  Surgeon: [name], MD;  Location: [facility];  Service: Cardiovascular;  Laterality: N/A;  ? CARDIOVERSION N/A 11/14/2018  ? Procedure: CARDIOVERSION (CATH LAB);  Surgeon: [name], MD;  Location: [facility];  Service: Cardiovascular;  Laterality: N/A;  ? CARDIOVERSION N/A 06/05/2019  ? Procedure: CARDIOVERSION;  Surgeon: [name], MD;  Location: [facility];  Service: Cardiovascular;  Laterality: N/A;  ? CARDIOVERSION N/A 08/14/2019  ? Procedure: CARDIOVERSION;  Surgeon: [name], MD;  Location: [facility];  Service: Cardiovascular;  Laterality: N/A;  ? CARDIOVERSION N/A 11/09/2019  ? Procedure: CARDIOVERSION;  Surgeon: Isaias Cowman, MD;  Location: [facility];  Service: Cardiovascular;  Laterality: N/A;  ? CARDIOVERSION N/A 01/17/2020  ? Procedure: CARDIOVERSION;  Surgeon: Isaias Cowman, MD;  Location: [facility];  Service: Cardiovascular;  Laterality: N/A;  ? CARPAL TUNNEL RELEASE    ? ELECTROPHYSIOLOGIC STUDY N/A 08/17/2016  ? Procedure: Cardioversion;  Surgeon: [name], MD;  Location: [facility];  Service: Cardiovascular;  Laterality: N/A;  ? TUMOR EXCISION Left   ? x3 (arm)  ? ? ?Medical History: ?Past Medical History:  ?Diagnosis Date  ? 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 a. s/p bioprosthetic valve replacement 2008 at [facility];  b. 01/2015 Echo: EF 60-65%, no rwma, Gr1 DD, mildly dil LA, nl RV fxn.  ? Aspiration pneumonia ([place])   ? Atrial fibrillation with RVR ([place]) 01/11/2015  ? Atrial flutter ([place])   ? a. 08/2016 s/p DCCV.  Remains on flecainide 50 mg bid.  ? Basal skull fracture ([name]) 20 yrs ago  ? CHF (congestive heart  failure) ([place])   ? Chronic respiratory failure ([place])   ? COPD (chronic obstructive pulmonary disease) ([place])   ? a. on home O2 at 2L since 2008  ? Deafness in left ear   ? 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 Essential hypertension 01/24/2015  ? History of cardiac cath   ? a. 2008 prior to Aortic aneurysm repair-->nl cors.  ? History of stress test   ? a. 10/2015 MV: no ischemia/infarct.  ? HLD (hyperlipidemia)   ? HTN (hypertension)   ? Hypothyroidism   ? Obesity   ? PAF (paroxysmal atrial fibrillation) ([place])   ? a. on Eliquis; b. CHADS2VASc = 3 (HTN, age x 1, female).  ? Paroxysmal atrial fibrillation ([place]) 01/24/2015  ? Right upper quadrant abdominal tenderness without rebound tenderness 03/02/2018  ? S/P ascending aortic aneurysm repair 2008  ? ? ?Family History: ?Family History  ?Problem Relation Age of Onset  ? Stroke Father   ? Stroke Paternal Grandmother   ? ? ?  Social History: ?Social History  ? ?Socioeconomic History  ? Marital status: Single  ?  Spouse name: Not on file  ? Number of children: Not on file  ? Years of education: Not on file  ? Highest education level: Not on file  ?Occupational History  ? Not on file  ?Tobacco Use  ? Smoking status: Former  ?  Types: Cigarettes  ? Smokeless tobacco: Never  ?Vaping Use  ? Vaping Use: Never used  ?Substance and Sexual Activity  ? Alcohol use: No  ? Drug use: No  ? Sexual activity: Never  ?  Birth control/protection: Surgical  ?Other Topics Concern  ? Not on file  ?Social History Narrative  ? Not on file  ? ?Social Determinants of Health  ? ?Financial Resource Strain: Low Risk   ? Difficulty of Paying Living Expenses: Not very hard  ?Food Insecurity: Not on file  ?Transportation Needs: Not on file  ?Physical Activity: Not on file  ?Stress: Not on file  ?Social Connections: Not on file  ?Intimate Partner Violence: Not on file  ? ? ?Vital Signs: ?Blood pressure 134/76, pulse 74, temperature 98.4 ?F (36.9 ?C), resp. rate 16, height 4\' 10"  (1.473 m), weight 186 lb  12.8 oz (84.7 kg), SpO2 94 %. ? ?Examination: ?General Appearance: The patient is well-developed, well-nourished, and in no distress. ?Skin: Gross inspection of skin unremarkable. ?Head: normocephalic, no gross defo

## 2021-12-23 NOTE — Telephone Encounter (Signed)
Awaiting 12/23/21 office notes for pulmonary referral-Toni ?

## 2021-12-23 NOTE — Patient Instructions (Signed)
Chronic Obstructive Pulmonary Disease  Chronic obstructive pulmonary disease (COPD) is a long-term (chronic) lung problem. When you have COPD, it is hard for air to get in and out of your lungs. Usually the condition gets worse over time, and your lungs will never return to normal. There are things you can do to keep yourself as healthy as possible. What are the causes? Smoking. This is the most common cause. Certain genes passed from parent to child (inherited). What increases the risk? Being exposed to secondhand smoke from cigarettes, pipes, or cigars. Being exposed to chemicals and other irritants, such as fumes and dust in the work environment. Having chronic lung conditions or infections. What are the signs or symptoms? Shortness of breath, especially during physical activity. A long-term cough with a large amount of thick mucus. Sometimes, the cough may not have any mucus (dry cough). Wheezing. Breathing quickly. Skin that looks gray or blue, especially in the fingers, toes, or lips. Feeling tired (fatigue). Weight loss. Chest tightness. Having infections often. Episodes when breathing symptoms become much worse (exacerbations). At the later stages of this disease, you may have swelling in the ankles, feet, or legs. How is this treated? Taking medicines. Quitting smoking, if you smoke. Rehabilitation. This includes steps to make your body work better. It may involve a team of specialists. Doing exercises. Making changes to your diet. Using oxygen. Lung surgery. Lung transplant. Comfort measures (palliative care). Follow these instructions at home: Medicines Take over-the-counter and prescription medicines only as told by your doctor. Talk to your doctor before taking any cough or allergy medicines. You may need to avoid medicines that cause your lungs to be dry. Lifestyle If you smoke, stop smoking. Smoking makes the problem worse. Do not smoke or use any products that  contain nicotine or tobacco. If you need help quitting, ask your doctor. Avoid being around things that make your breathing worse. This may include smoke, chemicals, and fumes. Stay active, but remember to rest as well. Learn and use tips on how to manage stress and control your breathing. Make sure you get enough sleep. Most adults need at least 7 hours of sleep every night. Eat healthy foods. Eat smaller meals more often. Rest before meals. Controlled breathing Learn and use tips on how to control your breathing as told by your doctor. Try: Breathing in (inhaling) through your nose for 1 second. Then, pucker your lips and breath out (exhale) through your lips for 2 seconds. Putting one hand on your belly (abdomen). Breathe in slowly through your nose for 1 second. Your hand on your belly should move out. Pucker your lips and breathe out slowly through your lips. Your hand on your belly should move in as you breathe out.  Controlled coughing Learn and use controlled coughing to clear mucus from your lungs. Follow these steps: Lean your head a little forward. Breathe in deeply. Try to hold your breath for 3 seconds. Keep your mouth slightly open while coughing 2 times. Spit any mucus out into a tissue. Rest and do the steps again 1 or 2 times as needed. General instructions Make sure you get all the shots (vaccines) that your doctor recommends. Ask your doctor about a flu shot and a pneumonia shot. Use oxygen therapy and pulmonary rehabilitation if told by your doctor. If you need home oxygen therapy, ask your doctor if you should buy a tool to measure your oxygen level (oximeter). Make a COPD action plan with your doctor. This helps you   to know what to do if you feel worse than usual. Manage any other conditions you have as told by your doctor. Avoid going outside when it is very hot, cold, or humid. Avoid people who have a sickness you can catch (contagious). Keep all follow-up  visits. Contact a doctor if: You cough up more mucus than usual. There is a change in the color or thickness of the mucus. It is harder to breathe than usual. Your breathing is faster than usual. You have trouble sleeping. You need to use your medicines more often than usual. You have trouble doing your normal activities such as getting dressed or walking around the house. Get help right away if: You have shortness of breath while resting. You have shortness of breath that stops you from: Being able to talk. Doing normal activities. Your chest hurts for longer than 5 minutes. Your skin color is more blue than usual. Your pulse oximeter shows that you have low oxygen for longer than 5 minutes. You have a fever. You feel too tired to breathe normally. These symptoms may represent a serious problem that is an emergency. Do not wait to see if the symptoms will go away. Get medical help right away. Call your local emergency services (911 in the [place] Do not drive yourself to the hospital. Summary Chronic obstructive pulmonary disease (COPD) is a long-term lung problem. The way your lungs work will never return to normal. Usually the condition gets worse over time. There are things you can do to keep yourself as healthy as possible. Take over-the-counter and prescription medicines only as told by your doctor. If you smoke, stop. Smoking makes the problem worse. This information is not intended to replace advice given to you by your health care provider. Make sure you discuss any questions you have with your health care provider. Document Revised: 07/02/2020 Document Reviewed: 07/02/2020 Elsevier Patient Education  2023 [employer]  

## 2021-12-24 ENCOUNTER — Telehealth: Payer: Self-pay

## 2021-12-24 NOTE — Telephone Encounter (Signed)
Tiffany from [facility] [phone] #[phone] called that pt refused for home health aide so they going to discharge her  ?

## 2021-12-24 NOTE — Telephone Encounter (Signed)
Patient scheduled for ct chest on 01/01/22 @ 1:15 kp/tat ?

## 2021-12-24 NOTE — Telephone Encounter (Signed)
error 

## 2021-12-31 DIAGNOSIS — G4733 Obstructive sleep apnea (adult) (pediatric): Secondary | ICD-10-CM | POA: Diagnosis not present

## 2022-01-01 ENCOUNTER — Other Ambulatory Visit: Payer: Self-pay

## 2022-01-01 ENCOUNTER — Ambulatory Visit
Admission: RE | Admit: 2022-01-01 | Discharge: 2022-01-01 | Disposition: A | Discharge status: Home / Self Care | Payer: Medicare Other | Source: Ambulatory Visit | Attending: Internal Medicine | Admitting: Internal Medicine

## 2022-01-01 DIAGNOSIS — J849 Interstitial pulmonary disease, unspecified: Secondary | ICD-10-CM | POA: Insufficient documentation

## 2022-01-01 DIAGNOSIS — I2510 Atherosclerotic heart disease of native coronary artery without angina pectoris: Secondary | ICD-10-CM | POA: Diagnosis not present

## 2022-01-01 DIAGNOSIS — J189 Pneumonia, unspecified organism: Secondary | ICD-10-CM | POA: Diagnosis not present

## 2022-01-01 DIAGNOSIS — J432 Centrilobular emphysema: Secondary | ICD-10-CM | POA: Diagnosis not present

## 2022-01-01 DIAGNOSIS — I700 Atherosclerosis of aorta: Secondary | ICD-10-CM | POA: Diagnosis not present

## 2022-01-06 ENCOUNTER — Ambulatory Visit: Payer: Medicare Other | Admitting: Internal Medicine

## 2022-01-06 ENCOUNTER — Encounter: Payer: Self-pay | Admitting: Internal Medicine

## 2022-01-06 VITALS — BP 107/64 | HR 74 | Temp 98.1°F | Resp 16 | Ht <= 58 in | Wt 188.0 lb

## 2022-01-06 DIAGNOSIS — Z6839 Body mass index (BMI) 39.0-39.9, adult: Secondary | ICD-10-CM

## 2022-01-06 DIAGNOSIS — J9611 Chronic respiratory failure with hypoxia: Secondary | ICD-10-CM

## 2022-01-06 DIAGNOSIS — J440 Chronic obstructive pulmonary disease with acute lower respiratory infection: Secondary | ICD-10-CM | POA: Diagnosis not present

## 2022-01-06 DIAGNOSIS — E662 Morbid (severe) obesity with alveolar hypoventilation: Secondary | ICD-10-CM | POA: Diagnosis not present

## 2022-01-06 DIAGNOSIS — J209 Acute bronchitis, unspecified: Secondary | ICD-10-CM | POA: Diagnosis not present

## 2022-01-06 MED ORDER — DOXYCYCLINE HYCLATE 100 MG PO TABS: 100.0000 mg | ORAL_TABLET | Freq: Two times a day (BID) | ORAL | 0 refills | Status: DC

## 2022-01-06 NOTE — Progress Notes (Signed)
PROVIDER NOTE: Information contained herein reflects review and annotations entered in association with encounter. Interpretation of such information and data should be left to medically-trained personnel. Information provided to patient can be located elsewhere in the medical record under "Patient Instructions". Document created using STT-dictation technology, any transcriptional errors that may result from process are unintentional.  ?  ?Patient: [name]  Service Category: E/M  Provider: Gaspar Cola, MD  ?DOB: [birth year]  DOS: 01/07/2022  Specialty: Interventional Pain Management  ?MRN: [id number]  Setting: Ambulatory outpatient  PCP: [name], MD  ?Type: Established Patient    Referring Provider: [name], MD  ?Location: Office  Delivery: Face-to-face    ? ?HPI  ?[name], a 76 y.o. year old female, is here today because of her Chronic pain of both upper extremities [M79.601, M79.602, G89.29]. [name]'s primary complain today is Neck Pain ?Last encounter: My last encounter with her was on 10/16/2021. 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; Shoulder impingement syndrome (Left); Chronic left shoulder pain; Chronic pain syndrome; Chronic shoulder pain (1ry area of Pain) (Bilateral) (L>R); Chronic upper extremity pain (2ry area of Pain) (Bilateral) (L>R); Cervicalgia; Chronic neck pain (3ry area of Pain) (Posterior) (Bilateral) (L>R); Shoulder blade pain (4th area of Pain) (Left); Osteoarthritis of glenohumeral joint (Left); Osteoarthritis of AC (acromioclavicular) joint (Left); Osteoarthritis of glenohumeral joints (Bilateral); Osteoarthritis of acromioclavicular joints (Bilateral); Primary osteoarthritis of shoulders (Bilateral); DDD (degenerative disc disease), cervical; Cervical radiculitis (Left); Cervical radiculopathy (Left); C6 radiculopathy (Left); C7 radiculopathy (Left); Unable to maintain body in lying position; and  Chronic intractable headache on their pertinent problem list. ?Pain Assessment: Severity of Chronic pain is reported as a 8 /10. Location: Shoulder Right, Left/pain radiaties down both arms to her hand, right is the worse. Onset: More than a month ago. Quality: Aching, Burning, Constant, Tingling, Throbbing, Shooting, Stabbing. Timing: Constant. Modifying factor(s): Procedures Meds. ?Vitals:  height is 4\' 10"  (1.473 m) and weight is 185 lb (83.9 kg). Her temporal temperature is 97.8 ?F (36.6 ?C). Her blood pressure is 108/63 and her pulse is 70. Her respiration is 16 and oxygen saturation is 95%.  ? ?Reason for encounter: evaluation of worsening, or previously known (established) problem.  The patient returns to the clinic today indicated that she is interested in the possibility of another nerve block to help with her pain.  At this time, she indicates the primary area of pain to be that of the left upper extremity.  Her pain is bilateral, but on the left side it is a lot more significant.  The same is true for her shoulder pain.  She is currently having symptoms compatible with a C6 and/or C7 radiculopathy on the left side.  Based on these findings, I will be scheduling her to come in for a left-sided cervical epidural steroid injection under fluoroscopic guidance.  Should her shoulder pain continue once the radicular symptoms are gone, we will consider repeating the suprascapular nerve block.  However, I have a note from the prior procedures that the patient is not only hard of hearing, but she is unable to tolerate the prone position for any significant period time.  Because of this, she is not a good candidate for radiofrequency ablation.  I am pretty sure that the procedure itself would provide her with significant long-term benefit however, I see it difficult that she could stand for a prolonged.  Time to get the procedure accomplished.  Some  of the reasons for this include the patient having quite a bit of  anxiety and also oxygen dependent orthopnea. ? ?Post-procedure evaluation  ? Type: Suprascapular nerve block #2  ?Laterality:  Bilateral ?Level: Superior to scapular spine, lateral to supraspinatus fossa (Suprascapular notch).  ?Purpose: Diagnostic/Therapeutic ?Date: 09/25/2021 ?Performed by: Gaspar Cola, MD ?Anesthesia: Local (1-2% Lidocaine)  ?Anxiolysis: None  ?Sedation: None  ?Guidance: Fluoroscopy         ? ?Indications: Shoulder pain, severe enough to impact quality of life and/or function. ?1. Chronic shoulder pain (1ry area of Pain) (Bilateral) (L>R)   ?2. Osteoarthritis of acromioclavicular joints (Bilateral)   ?3. Osteoarthritis of glenohumeral joints (Bilateral)   ?4. Primary osteoarthritis of shoulders (Bilateral)   ?5. Shoulder impingement syndrome (Left)   ?6. Shoulder blade pain (4th area of Pain) (Left)   ? Chronic anticoagulation (Eliquis)   ? ?NAS-11 score:  ? Pre-procedure: 5 /10  ? Post-procedure: 5 /10  ?   ?Effectiveness:  ?Initial hour after procedure: 100 %. ?Subsequent 4-6 hours post-procedure: 100 %. ?Analgesia past initial 6 hours: 100 % (last 2 1/2 months). ?Ongoing improvement:  ?Analgesic: The patient refers that her pain is back to baseline however, she did enjoy 2-1/2 months of 100% relief of her pain. ?Function: [name] reports improvement in function while the relief was in place. ?ROM: [name] reports improvement in ROM while she was having no pain. ? ?Pharmacotherapy Assessment  ?Analgesic: Oxycodone/APAP 7.5/325, 1 tab p.o. daily (# 45) (last filled on 06/02/2021) by Erich Montane, FNP-C, working for Dr. Talmadge Chad, MD. ?MME/day: 7.5 mg/day  ? ?Monitoring: ?[place] PMP: PDMP reviewed during this encounter.       ?Pharmacotherapy: No side-effects or adverse reactions reported. ?Compliance: No problems identified. ?Effectiveness: Clinically acceptable. ? ?Chauncey Fischer, RN  01/07/2022  3:21 PM  Sign when Signing Visit ?Safety precautions to be maintained  throughout the outpatient stay will include: orient to surroundings, keep bed in low position, maintain call bell within reach at all times, provide assistance with transfer out of bed and ambulation.  ?   UDS:  ?Summary  ?Date Value Ref Range Status  ?04/30/2021 Note  Final  ?  Comment:  ?  ==================================================================== ?Compliance Drug Analysis, Ur ?==================================================================== ?Test                             Result       Flag       Units ? ?Drug Present and Declared for Prescription Verification ?  Alpha-hydroxyalprazolam        107          EXPECTED   ng/mg creat ?   Alpha-hydroxyalprazolam is an expected metabolite of alprazolam. ?   Source of alprazolam is a scheduled prescription medication. ? ?  Oxycodone                      1371         EXPECTED   ng/mg creat ?  Oxymorphone                    600          EXPECTED   ng/mg creat ?  Noroxycodone                   2311         EXPECTED   ng/mg creat ?  Sources of oxycodone include scheduled prescription medications. ?   Oxymorphone and noroxycodone are expected metabolites of oxycodone. ?   Oxymorphone is also available as a scheduled prescription medication. ? ?  Sertraline                     PRESENT      EXPECTED ?  Desmethylsertraline            PRESENT      EXPECTED ?   Desmethylsertraline is an expected metabolite of sertraline. ? ?  Trazodone                      PRESENT      EXPECTED ?  1,3 chlorophenyl piperazine    PRESENT      EXPECTED ?   1,3-chlorophenyl piperazine is an expected metabolite of trazodone. ? ?  Acetaminophen                  PRESENT      EXPECTED ? ?Drug Absent but Declared for Prescription Verification ?  Diclofenac                     Not Detected UNEXPECTED ?   Diclofenac, as indicated in the declared medication list, is not ?   always detected even when used as directed. ? ?==================================================================== ?Test                       Result    Flag   Units      Ref Range ?  Creatinine              45               mg/dL      >=20 ?==================================================================== ?Declared Medication

## 2022-01-06 NOTE — Progress Notes (Unsigned)
[facility] [place], [place] [address]  Internal MEDICINE  Office Visit Note  Patient Name: [name]  [id number]  [id number]  Date of Service: 01/29/2022  Chief Complaint  Patient presents with   Follow-up   Hyperlipidemia   Hypertension   Quality Metric Gaps    Pneumonia Vaccine    HPI Pt is here for routine follow-up. She continues to have shortness of breath cough sputum production, she was seen by pulmonary and cardiology, she has been in and out of the hospital.  She is a little bit better using her nebulizer  has history of chronic respiratory failure with hypoxia, multiple medical problems and complicated history of COPD on chronic ~4L O2, HTN, HLD, CVA, GERD, hypothyroidism, depression, bicuspid AV s/p AVR with bioprosthetic valve, PAF on Eliquis, dCHF, IDA, chronic pain  Current Medication: Outpatient Encounter Medications as of 01/06/2022  Medication Sig   albuterol (VENTOLIN HFA) 108 (90 Base) MCG/ACT inhaler Inhale 2 puffs into the lungs every 6 (six) hours as needed for wheezing or shortness of breath.   ALPRAZolam (XANAX) 0.25 MG tablet Take 1 tablet (0.25 mg total) by mouth 2 (two) times daily as needed for anxiety.   amLODipine (NORVASC) 10 MG tablet Take 1 tablet by mouth daily.   apixaban (ELIQUIS) 5 MG TABS tablet Take 1 tablet (5 mg total) by mouth 2 (two) times daily.   atorvastatin (LIPITOR) 40 MG tablet 1 tab po qhs   bumetanide (BUMEX) 1 MG tablet TAKE 2 TABLETS BY MOUTH TWICE DAILY   Calcium Carbonate-Vitamin D (CALCIUM 600+D PO) Take 1 tablet by mouth daily.   carvedilol (COREG) 6.25 MG tablet Take 1 tablet (6.25 mg total) by mouth 2 (two) times daily with a meal.   Coenzyme Q10 (COQ10) 200 MG CAPS Take 200 mg by mouth daily.   diclofenac Sodium (VOLTAREN) 1 % GEL SMARTSIG:Gram(s) Topical Twice Daily   ferrous sulfate 325 (65 FE) MG tablet Take 1 tablet (325 mg total) by mouth daily with breakfast.   levothyroxine  (SYNTHROID) 25 MCG tablet Take 1 tablet (25 mcg total) by mouth daily before breakfast.   lisinopril (ZESTRIL) 40 MG tablet TAKE 1 TABLET(40 MG) BY MOUTH DAILY(DOSE INCREASE)   loratadine (CLARITIN) 10 MG tablet Take 10 mg by mouth daily.   pantoprazole (PROTONIX) 40 MG tablet Take 1 tablet (40 mg total) by mouth daily.   potassium chloride (KLOR-CON) 10 MEQ tablet Take 4 tablets (40 mEq total) by mouth 2 (two) times daily.   sertraline (ZOLOFT) 100 MG tablet Take one tab a day for Anxiety   sodium chloride (OCEAN) 0.65 % SOLN nasal spray Place 1 spray into both nostrils as needed for congestion.   traMADol (ULTRAM) 50 MG tablet Take one tab po bid for pain   traZODone (DESYREL) 50 MG tablet Take 1 tablet (50 mg total) by mouth at bedtime.   [DISCONTINUED] amoxicillin (AMOXIL) 500 MG capsule Take 4 cap po 1 hrs before dental procedure (Patient not taking: Reported on 01/25/2022)   [DISCONTINUED] Budeson-Glycopyrrol-Formoterol (BREZTRI AEROSPHERE) 160-9-4.8 MCG/ACT AERO Inhale 2 puffs into the lungs 2 (two) times daily.   [DISCONTINUED] doxycycline (VIBRA-TABS) 100 MG tablet Take 1 tablet (100 mg total) by mouth 2 (two) times daily.   [DISCONTINUED] oxyCODONE-acetaminophen (PERCOCET) 7.5-325 MG tablet Take one tab in am and half in pm for severe shoulder pain   No facility-administered encounter medications on file as of 01/06/2022.    Surgical History: Past Surgical History:  Procedure  Laterality Date   ABDOMINAL AORTIC ANEURYSM REPAIR  2008   ABDOMINAL HYSTERECTOMY     AORTIC VALVE REPLACEMENT  2008   CARDIAC CATHETERIZATION     [facility]   CARDIOVERSION N/A 08/15/2018   Procedure: CARDIOVERSION;  Surgeon: [name], MD;  Location: [facility] ORS;  Service: Cardiovascular;  Laterality: N/A;   CARDIOVERSION N/A 11/14/2018   Procedure: CARDIOVERSION (CATH LAB);  Surgeon: [name], MD;  Location: [facility] ORS;  Service: Cardiovascular;  Laterality: N/A;   CARDIOVERSION N/A 06/05/2019    Procedure: CARDIOVERSION;  Surgeon: [name], MD;  Location: [facility] ORS;  Service: Cardiovascular;  Laterality: N/A;   CARDIOVERSION N/A 08/14/2019   Procedure: CARDIOVERSION;  Surgeon: [name], MD;  Location: [facility];  Service: Cardiovascular;  Laterality: N/A;   CARDIOVERSION N/A 11/09/2019   Procedure: CARDIOVERSION;  Surgeon: Isaias Cowman, MD;  Location: [facility];  Service: Cardiovascular;  Laterality: N/A;   CARDIOVERSION N/A 01/17/2020   Procedure: CARDIOVERSION;  Surgeon: Isaias Cowman, MD;  Location: [facility];  Service: Cardiovascular;  Laterality: N/A;   CARPAL TUNNEL RELEASE     ELECTROPHYSIOLOGIC STUDY N/A 08/17/2016   Procedure: Cardioversion;  Surgeon: [name], MD;  Location: [facility];  Service: Cardiovascular;  Laterality: N/A;   TUMOR EXCISION Left    x3 (arm)    Medical History: Past Medical History:  Diagnosis Date   Aortic 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 s/p bioprosthetic valve replacement 2008 at [facility];  b. 01/2015 Echo: EF 60-65%, no rwma, Gr1 DD, mildly dil LA, nl RV fxn.   Aspiration pneumonia ([place])    Atrial fibrillation with RVR ([place]) 01/11/2015   Atrial flutter ([place])    a. 08/2016 s/p DCCV.  Remains on flecainide 50 mg bid.   Basal skull fracture ([name]) 20 yrs ago   CHF (congestive heart failure) ([name])    Chronic respiratory failure ([name])    COPD (chronic obstructive pulmonary disease) ([place])    a. 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 2008 prior to Aortic aneurysm repair-->nl cors.   History of stress test    a. 10/2015 MV: no ischemia/infarct.   HLD (hyperlipidemia)    HTN (hypertension)    Hypothyroidism    Obesity    PAF (paroxysmal atrial fibrillation) ([name])    a. on Eliquis; b. CHADS2VASc = 3 (HTN, age x 34, female).   Paroxysmal atrial fibrillation ([name]) 01/24/2015   Right upper  quadrant abdominal tenderness without rebound tenderness 03/02/2018   S/P ascending aortic aneurysm repair 2008    Family History: Family History  Problem Relation Age of Onset   Stroke Father    Stroke Paternal Grandmother     Social History   Socioeconomic History   Marital status: Single    Spouse name: Not on file   Number of children: Not on file   Years of education: Not on file   Highest education level: Not on file  Occupational History   Not on file  Tobacco Use   Smoking status: Former    Types: Cigarettes   Smokeless tobacco: Never  Vaping Use   Vaping Use: Never used  Substance and Sexual Activity   Alcohol use: No   Drug use: No   Sexual activity: Never    Birth control/protection: Surgical  Other Topics Concern   Not on file  Social History Narrative   Not on file   Social Determinants of Health   Financial Resource Strain: Low Risk    Difficulty of Paying Living Expenses: Not very hard  Food Insecurity: Not on file  Transportation Needs: Not on file  Physical Activity: Not on file  Stress: Not on file  Social Connections: Not on file  Intimate Partner Violence: Not on file      Review of Systems  Constitutional:  Negative for chills, fatigue and unexpected weight change.  HENT:  Negative for congestion, rhinorrhea, sneezing and sore throat.   Eyes:  Negative for redness.  Respiratory:  Positive for shortness of breath. Negative for cough and chest tightness.   Cardiovascular:  Negative for chest pain and palpitations.  Gastrointestinal:  Negative for abdominal pain, constipation, diarrhea, nausea and vomiting.  Genitourinary:  Negative for dysuria and frequency.  Musculoskeletal:  Negative for arthralgias, back pain, joint swelling and neck pain.  Skin:  Negative for rash.  Neurological: Negative.  Negative for tremors and numbness.  Hematological:  Negative for adenopathy. Does not bruise/bleed easily.  Psychiatric/Behavioral:  Negative  for behavioral problems (Depression), sleep disturbance and suicidal ideas. The patient is not nervous/anxious.    Vital Signs: BP 107/64   Pulse 74   Temp 98.1 F (36.7 C)   Resp 16   Ht 4\' 10"  (1.473 m)   Wt 188 lb (85.3 kg)   SpO2 91%   BMI 39.29 kg/m    Physical Exam Constitutional:      Appearance: Normal appearance.  HENT:     Head: Normocephalic and atraumatic.     Nose: Nose normal.     Mouth/Throat:     Mouth: Mucous membranes are moist.     Pharynx: No posterior oropharyngeal erythema.  Eyes:     Extraocular Movements: Extraocular movements intact.     Pupils: Pupils are equal, round, and reactive to light.  Cardiovascular:     Rate and Rhythm: Regular rhythm.     Pulses: Normal pulses.     Heart sounds: Normal heart sounds.  Pulmonary:     Effort: Pulmonary effort is normal.     Breath sounds: Normal breath sounds.  Neurological:     General: No focal deficit present.     Mental Status: She is alert.  Psychiatric:        Mood and Affect: Mood normal.        Behavior: Behavior normal.       Assessment/Plan: 1. Acute bronchitis with chronic obstructive pulmonary disease (COPD) ([name]) Start doxycycline 100 mg twice a day for ongoing cough and congestion continue using her inhalers continue O2  2. Chronic respiratory failure with hypoxia ([name]) Patient is on CPAP at home now is NIV we will continue to monitor  3. Class 2 obesity with alveolar hypoventilation, serious comorbidity, and body mass index (BMI) of 39.0 to 39.9 in adult [facility]) Patient has serious core morbidities with l sedentary lifestyle.  Encouraged to have a high-protein diet with low carbs and fats  General Counseling: Javonda verbalizes understanding of the findings of todays visit and agrees with plan of treatment. I have discussed any further diagnostic evaluation that may be needed or ordered today. We also reviewed her medications today. she has been encouraged to call the office with any  questions or concerns that should arise related to todays visit.    No orders of the defined types were placed in  this encounter.   Meds ordered this encounter  Medications   DISCONTD: doxycycline (VIBRA-TABS) 100 MG tablet    Sig: Take 1 tablet (100 mg total) by mouth 2 (two) times daily.    Dispense:  20 tablet    Refill:  0    Total time spent:35 Minutes Time spent includes review of chart, medications, test results, and follow up plan with the patient.   [place] Controlled Substance Database was reviewed by me.   Dr [name] Internal medicine

## 2022-01-07 ENCOUNTER — Encounter: Payer: Self-pay | Admitting: Pain Medicine

## 2022-01-07 ENCOUNTER — Ambulatory Visit: Payer: Medicare Other | Attending: Pain Medicine | Admitting: Pain Medicine

## 2022-01-07 VITALS — BP 108/63 | HR 70 | Temp 97.8°F | Resp 16 | Ht <= 58 in | Wt 185.0 lb

## 2022-01-07 DIAGNOSIS — M79601 Pain in right arm: Secondary | ICD-10-CM | POA: Diagnosis not present

## 2022-01-07 DIAGNOSIS — M5412 Radiculopathy, cervical region: Secondary | ICD-10-CM | POA: Diagnosis not present

## 2022-01-07 DIAGNOSIS — G894 Chronic pain syndrome: Secondary | ICD-10-CM | POA: Diagnosis not present

## 2022-01-07 DIAGNOSIS — M542 Cervicalgia: Secondary | ICD-10-CM | POA: Insufficient documentation

## 2022-01-07 DIAGNOSIS — M898X1 Other specified disorders of bone, shoulder: Secondary | ICD-10-CM | POA: Insufficient documentation

## 2022-01-07 DIAGNOSIS — R519 Headache, unspecified: Secondary | ICD-10-CM | POA: Insufficient documentation

## 2022-01-07 DIAGNOSIS — M25512 Pain in left shoulder: Secondary | ICD-10-CM | POA: Diagnosis not present

## 2022-01-07 DIAGNOSIS — M25511 Pain in right shoulder: Secondary | ICD-10-CM | POA: Diagnosis not present

## 2022-01-07 DIAGNOSIS — M79602 Pain in left arm: Secondary | ICD-10-CM | POA: Diagnosis not present

## 2022-01-07 DIAGNOSIS — G8929 Other chronic pain: Secondary | ICD-10-CM | POA: Insufficient documentation

## 2022-01-07 NOTE — Progress Notes (Signed)
Safety precautions to be maintained throughout the outpatient stay will include: orient to surroundings, keep bed in low position, maintain call bell within reach at all times, provide assistance with transfer out of bed and ambulation.  

## 2022-01-07 NOTE — Patient Instructions (Signed)
______________________________________________________________________ ? ?Preparing for Procedure with Sedation ? ?NOTICE: Due to recent regulatory changes, starting on April 07, 2021, procedures requiring intravenous (IV) sedation will no longer be performed at the [facility].  These types of procedures are required to be performed at [facility] ambulatory surgery facility.  We are very sorry for the inconvenience. ? ?Procedure appointments are limited to planned procedures: ?No Prescription Refills. ?No disability issues will be discussed. ?No medication changes will be discussed. ? ?Instructions: ?Oral Intake: Do not eat or drink anything for at least 8 hours prior to your procedure. (Exception: Blood Pressure Medication. See below.) ?Transportation: A driver is required. You may not drive yourself after the procedure. ?Blood Pressure Medicine: Do not forget to take your blood pressure medicine with a sip of water the morning of the procedure. If your Diastolic (lower reading) is above 100 mmHg, elective cases will be cancelled/rescheduled. ?Blood thinners: These will need to be stopped for procedures. Notify our staff if you are taking any blood thinners. Depending on which one you take, there will be specific instructions on how and when to stop it. ?Diabetics on insulin: Notify the staff so that you can be scheduled 1st case in the morning. If your diabetes requires high dose insulin, take only ? of your normal insulin dose the morning of the procedure and notify the staff that you have done so. ?Preventing infections: Shower with an antibacterial soap the morning of your procedure. ?Build-up your immune system: Take 1000 mg of Vitamin C with every meal (3 times a day) the day prior to your procedure. ?Antibiotics: Inform the staff if you have a condition or reason that requires you to take antibiotics before dental procedures. ?Pregnancy: If you are pregnant, call and cancel the procedure. ?Sickness: If  you have a cold, fever, or any active infections, call and cancel the procedure. ?Arrival: You must be in the facility at least 30 minutes prior to your scheduled procedure. ?Children: Do not bring children with you. ?Dress appropriately: There is always the possibility that your clothing may get soiled. ?Valuables: Do not bring any jewelry or valuables. ? ?Reasons to call and reschedule or cancel your procedure: (Following these recommendations will minimize the risk of a serious complication.) ?Surgeries: Avoid having procedures within 2 weeks of any surgery. (Avoid for 2 weeks before or after any surgery). ?Flu Shots: Avoid having procedures within 2 weeks of a flu shots. (Avoid for 2 weeks before or after immunizations). ?Barium: Avoid having a procedure within 7-10 days after having had a radiological study involving the use of radiological contrast. (Myelograms, Barium swallow or enema study). ?Heart attacks: Avoid any elective procedures or surgeries for the initial 6 months after a "Myocardial Infarction" (Heart Attack). ?Blood thinners: It is imperative that you stop these medications before procedures. Let us know if you if you take any blood thinner.  ?Infection: Avoid procedures during or within two weeks of an infection (including chest colds or gastrointestinal problems). Symptoms associated with infections include: Localized redness, fever, chills, night sweats or profuse sweating, burning sensation when voiding, cough, congestion, stuffiness, runny nose, sore throat, diarrhea, nausea, vomiting, cold or Flu symptoms, recent or current infections. It is specially important if the infection is over the area that we intend to treat. ?Heart and lung problems: Symptoms that may suggest an active cardiopulmonary problem include: cough, chest pain, breathing difficulties or shortness of breath, dizziness, ankle swelling, uncontrolled high or unusually low blood pressure, and/or palpitations. If you are    experiencing any of these symptoms, cancel your procedure and contact your primary care physician for an evaluation. ? ?Remember:  ?Regular Business hours are:  ?Monday to Thursday 8:00 AM to 4:00 PM ? ?Provider's Schedule: ?Alleyah Twombly, MD:  ?Procedure days: Tuesday and Thursday 7:30 AM to 4:00 PM ? ?Bilal Lateef, MD:  ?Procedure days: Monday and Wednesday 7:30 AM to 4:00 PM ?______________________________________________________________________ ? ____________________________________________________________________________________________ ? ?General Risks and Possible Complications ? ?Patient Responsibilities: It is important that you read this as it is part of your informed consent. It is our duty to inform you of the risks and possible complications associated with treatments offered to you. It is your responsibility as a patient to read this and to ask questions about anything that is not clear or that you believe was not covered in this document. ? ?Patient?s Rights: You have the right to refuse treatment. You also have the right to change your mind, even after initially having agreed to have the treatment done. However, under this last option, if you wait until the last second to change your mind, you may be charged for the materials used up to that point. ? ?Introduction: Medicine is not an exact science. Everything in Medicine, including the lack of treatment(s), carries the potential for danger, harm, or loss (which is by definition: Risk). In Medicine, a complication is a secondary problem, condition, or disease that can aggravate an already existing one. All treatments carry the risk of possible complications. The fact that a side effects or complications occurs, does not imply that the treatment was conducted incorrectly. It must be clearly understood that these can happen even when everything is done following the highest safety standards. ? ?No treatment: You can choose not to proceed with the  proposed treatment alternative. The ?PRO(s)? would include: avoiding the risk of complications associated with the therapy. The ?CON(s)? would include: not getting any of the treatment benefits. These benefits fall under one of three categories: diagnostic; therapeutic; and/or palliative. Diagnostic benefits include: getting information which can ultimately lead to improvement of the disease or symptom(s). Therapeutic benefits are those associated with the successful treatment of the disease. Finally, palliative benefits are those related to the decrease of the primary symptoms, without necessarily curing the condition (example: decreasing the pain from a flare-up of a chronic condition, such as incurable terminal cancer). ? ?General Risks and Complications: These are associated to most interventional treatments. They can occur alone, or in combination. They fall under one of the following six (6) categories: no benefit or worsening of symptoms; bleeding; infection; nerve damage; allergic reactions; and/or death. ?No benefits or worsening of symptoms: In Medicine there are no guarantees, only probabilities. No healthcare provider can ever guarantee that a medical treatment will work, they can only state the probability that it may. Furthermore, there is always the possibility that the condition may worsen, either directly, or indirectly, as a consequence of the treatment. ?Bleeding: This is more common if the patient is taking a blood thinner, either prescription or over the counter (example: Goody Powders, Fish oil, Aspirin, Garlic, etc.), or if suffering a condition associated with impaired coagulation (example: Hemophilia, cirrhosis of the liver, low platelet counts, etc.). However, even if you do not have one on these, it can still happen. If you have any of these conditions, or take one of these drugs, make sure to notify your treating physician. ?Infection: This is more common in patients with a compromised  immune system, either due to disease (example:   diabetes, cancer, human immunodeficiency virus [HIV], etc.), or due to medications or treatments (example: therapies used to treat cancer and rheumatological dise

## 2022-01-08 NOTE — Telephone Encounter (Signed)
Pulmonary referral manually faxed to [facility] per patient request [phone] ?

## 2022-01-12 NOTE — Telephone Encounter (Signed)
Unable to get fax to go through. Mailed today to [facility]; [address]; [place]; [place], [place] ?

## 2022-01-13 ENCOUNTER — Ambulatory Visit: Payer: Medicare Other | Admitting: Nurse Practitioner

## 2022-01-16 ENCOUNTER — Ambulatory Visit: Payer: Medicare Other

## 2022-01-19 ENCOUNTER — Telehealth: Payer: Self-pay

## 2022-01-19 ENCOUNTER — Other Ambulatory Visit: Payer: Self-pay

## 2022-01-19 MED ORDER — OSELTAMIVIR PHOSPHATE 75 MG PO CAPS: 75.0000 mg | ORAL_CAPSULE | Freq: Two times a day (BID) | ORAL | 0 refills | Status: DC

## 2022-01-19 NOTE — Telephone Encounter (Signed)
Pt called that her granddaughter positive for flu and now she was having symptoms cold,sore throat ,fatigue ,chills and no appetite as per dr Humphrey Rolls send Tamiflu 75 mg BID for 5 days and advised her to take with food and also she can take Mucinex for cough ?

## 2022-01-20 ENCOUNTER — Ambulatory Visit: Payer: Medicare Other | Admitting: Internal Medicine

## 2022-01-20 ENCOUNTER — Other Ambulatory Visit: Payer: Self-pay

## 2022-01-20 MED ORDER — OSELTAMIVIR PHOSPHATE 75 MG PO CAPS: 75.0000 mg | ORAL_CAPSULE | Freq: Two times a day (BID) | ORAL | 0 refills | Status: DC

## 2022-01-22 ENCOUNTER — Telehealth: Payer: Self-pay

## 2022-01-22 DIAGNOSIS — J449 Chronic obstructive pulmonary disease, unspecified: Secondary | ICD-10-CM

## 2022-01-22 MED ORDER — BREZTRI AEROSPHERE 160-9-4.8 MCG/ACT IN AERO: 2.0000 | INHALATION_SPRAY | Freq: Two times a day (BID) | RESPIRATORY_TRACT | 11 refills | Status: DC

## 2022-01-23 ENCOUNTER — Other Ambulatory Visit: Payer: Self-pay | Admitting: Nurse Practitioner

## 2022-01-23 ENCOUNTER — Ambulatory Visit: Payer: Medicare Other

## 2022-01-23 ENCOUNTER — Telehealth: Payer: Self-pay

## 2022-01-23 DIAGNOSIS — G8929 Other chronic pain: Secondary | ICD-10-CM

## 2022-01-23 MED ORDER — OXYCODONE-ACETAMINOPHEN 7.5-325 MG PO TABS: ORAL_TABLET | ORAL | 0 refills | Status: DC

## 2022-01-23 NOTE — Telephone Encounter (Signed)
Patient called this morning to say that she has had no improvement since being on Tamiflu. Patient started treatment on Tuesday and has not had any results. Advised patient to go to the ED per [name].

## 2022-01-25 ENCOUNTER — Emergency Department: Payer: Medicare Other

## 2022-01-25 ENCOUNTER — Inpatient Hospital Stay
Admission: EM | Admit: 2022-01-25 | Discharge: 2022-01-27 | DRG: 194 | Disposition: A | Discharge status: Home Health | Payer: Medicare Other | Source: Ambulatory Visit | Attending: Internal Medicine | Admitting: Internal Medicine

## 2022-01-25 DIAGNOSIS — Z952 Presence of prosthetic heart valve: Secondary | ICD-10-CM | POA: Diagnosis not present

## 2022-01-25 DIAGNOSIS — Z6839 Body mass index (BMI) 39.0-39.9, adult: Secondary | ICD-10-CM | POA: Diagnosis not present

## 2022-01-25 DIAGNOSIS — I10 Essential (primary) hypertension: Secondary | ICD-10-CM | POA: Diagnosis not present

## 2022-01-25 DIAGNOSIS — Z7901 Long term (current) use of anticoagulants: Secondary | ICD-10-CM

## 2022-01-25 DIAGNOSIS — J129 Viral pneumonia, unspecified: Secondary | ICD-10-CM | POA: Diagnosis not present

## 2022-01-25 DIAGNOSIS — R062 Wheezing: Secondary | ICD-10-CM | POA: Diagnosis not present

## 2022-01-25 DIAGNOSIS — Z20822 Contact with and (suspected) exposure to covid-19: Secondary | ICD-10-CM | POA: Diagnosis present

## 2022-01-25 DIAGNOSIS — Z993 Dependence on wheelchair: Secondary | ICD-10-CM

## 2022-01-25 DIAGNOSIS — J189 Pneumonia, unspecified organism: Principal | ICD-10-CM | POA: Diagnosis present

## 2022-01-25 DIAGNOSIS — E785 Hyperlipidemia, unspecified: Secondary | ICD-10-CM | POA: Diagnosis not present

## 2022-01-25 DIAGNOSIS — Z66 Do not resuscitate: Secondary | ICD-10-CM | POA: Diagnosis not present

## 2022-01-25 DIAGNOSIS — I5032 Chronic diastolic (congestive) heart failure: Secondary | ICD-10-CM | POA: Diagnosis present

## 2022-01-25 DIAGNOSIS — J440 Chronic obstructive pulmonary disease with acute lower respiratory infection: Secondary | ICD-10-CM | POA: Diagnosis not present

## 2022-01-25 DIAGNOSIS — Z882 Allergy status to sulfonamides status: Secondary | ICD-10-CM | POA: Diagnosis not present

## 2022-01-25 DIAGNOSIS — E039 Hypothyroidism, unspecified: Secondary | ICD-10-CM | POA: Diagnosis not present

## 2022-01-25 DIAGNOSIS — R0602 Shortness of breath: Secondary | ICD-10-CM | POA: Diagnosis not present

## 2022-01-25 DIAGNOSIS — F32A Depression, unspecified: Secondary | ICD-10-CM | POA: Diagnosis not present

## 2022-01-25 DIAGNOSIS — Z79890 Hormone replacement therapy (postmenopausal): Secondary | ICD-10-CM | POA: Diagnosis not present

## 2022-01-25 DIAGNOSIS — Z9981 Dependence on supplemental oxygen: Secondary | ICD-10-CM | POA: Diagnosis not present

## 2022-01-25 DIAGNOSIS — I480 Paroxysmal atrial fibrillation: Secondary | ICD-10-CM | POA: Diagnosis present

## 2022-01-25 DIAGNOSIS — Z79899 Other long term (current) drug therapy: Secondary | ICD-10-CM | POA: Diagnosis not present

## 2022-01-25 DIAGNOSIS — Z883 Allergy status to other anti-infective agents status: Secondary | ICD-10-CM

## 2022-01-25 DIAGNOSIS — I110 Hypertensive heart disease with heart failure: Secondary | ICD-10-CM | POA: Diagnosis present

## 2022-01-25 DIAGNOSIS — D649 Anemia, unspecified: Secondary | ICD-10-CM | POA: Diagnosis not present

## 2022-01-25 DIAGNOSIS — I482 Chronic atrial fibrillation, unspecified: Secondary | ICD-10-CM | POA: Diagnosis not present

## 2022-01-25 DIAGNOSIS — Z87891 Personal history of nicotine dependence: Secondary | ICD-10-CM

## 2022-01-25 DIAGNOSIS — J449 Chronic obstructive pulmonary disease, unspecified: Secondary | ICD-10-CM | POA: Diagnosis present

## 2022-01-25 DIAGNOSIS — H9193 Unspecified hearing loss, bilateral: Secondary | ICD-10-CM | POA: Diagnosis not present

## 2022-01-25 DIAGNOSIS — I4891 Unspecified atrial fibrillation: Secondary | ICD-10-CM | POA: Diagnosis not present

## 2022-01-25 DIAGNOSIS — I501 Left ventricular failure: Secondary | ICD-10-CM | POA: Diagnosis not present

## 2022-01-25 DIAGNOSIS — J9611 Chronic respiratory failure with hypoxia: Secondary | ICD-10-CM | POA: Diagnosis present

## 2022-01-25 DIAGNOSIS — Z91048 Other nonmedicinal substance allergy status: Secondary | ICD-10-CM

## 2022-01-25 DIAGNOSIS — Z888 Allergy status to other drugs, medicaments and biological substances status: Secondary | ICD-10-CM

## 2022-01-25 LAB — CBC WITH DIFFERENTIAL/PLATELET
Abs Immature Granulocytes: 0.02 10*3/uL (ref 0.00–0.07)
Basophils Absolute: 0 10*3/uL (ref 0.0–0.1)
Basophils Relative: 1 %
Eosinophils Absolute: 0.2 10*3/uL (ref 0.0–0.5)
Eosinophils Relative: 2 %
HCT: 35.9 % — ABNORMAL LOW (ref 36.0–46.0)
Hemoglobin: 11.3 g/dL — ABNORMAL LOW (ref 12.0–15.0)
Immature Granulocytes: 0 %
Lymphocytes Relative: 19 %
Lymphs Abs: 1.2 10*3/uL (ref 0.7–4.0)
MCH: 28.2 pg (ref 26.0–34.0)
MCHC: 31.5 g/dL (ref 30.0–36.0)
MCV: 89.5 fL (ref 80.0–100.0)
Monocytes Absolute: 0.6 10*3/uL (ref 0.1–1.0)
Monocytes Relative: 10 %
Neutro Abs: 4.3 10*3/uL (ref 1.7–7.7)
Neutrophils Relative %: 68 %
Platelets: 268 10*3/uL (ref 150–400)
RBC: 4.01 MIL/uL (ref 3.87–5.11)
RDW: 14.9 % (ref 11.5–15.5)
WBC: 6.3 10*3/uL (ref 4.0–10.5)
nRBC: 0 % (ref 0.0–0.2)

## 2022-01-25 LAB — COMPREHENSIVE METABOLIC PANEL
ALT: 15 U/L (ref 0–44)
AST: 19 U/L (ref 15–41)
Albumin: 3.7 g/dL (ref 3.5–5.0)
Alkaline Phosphatase: 87 U/L (ref 38–126)
Anion gap: 8 (ref 5–15)
BUN: 17 mg/dL (ref 8–23)
CO2: 28 mmol/L (ref 22–32)
Calcium: 9.4 mg/dL (ref 8.9–10.3)
Chloride: 102 mmol/L (ref 98–111)
Creatinine, Ser: 0.85 mg/dL (ref 0.44–1.00)
GFR, Estimated: >60 mL/min (ref >60)
Glucose, Bld: 139 mg/dL — ABNORMAL HIGH (ref 70–99)
Potassium: 3.7 mmol/L (ref 3.5–5.1)
Sodium: 138 mmol/L (ref 135–145)
Total Bilirubin: 0.4 mg/dL (ref 0.3–1.2)
Total Protein: 6.8 g/dL (ref 6.5–8.1)

## 2022-01-25 LAB — RESP PANEL BY RT-PCR (FLU A&B, COVID) ARPGX2
Influenza A by PCR: NEGATIVE
Influenza B by PCR: NEGATIVE
SARS Coronavirus 2 by RT PCR: NEGATIVE

## 2022-01-25 LAB — BRAIN NATRIURETIC PEPTIDE: B Natriuretic Peptide: 127.6 pg/mL — ABNORMAL HIGH (ref 0.0–100.0)

## 2022-01-25 LAB — LACTIC ACID, PLASMA: Lactic Acid, Venous: 1.5 mmol/L (ref 0.5–1.9)

## 2022-01-25 LAB — TROPONIN I (HIGH SENSITIVITY): Troponin I (High Sensitivity): 8 ng/L (ref <18)

## 2022-01-25 MED ORDER — SODIUM CHLORIDE 0.9 % IV SOLN
500.0000 mg | INTRAVENOUS | Status: DC
  Filled 2022-01-25 (×2): qty 5

## 2022-01-25 MED ORDER — SALINE SPRAY 0.65 % NA SOLN: 1.0000 | NASAL | Status: DC | PRN

## 2022-01-25 MED ORDER — SODIUM CHLORIDE 0.9 % IV SOLN
500.0000 mg | Freq: Once | INTRAVENOUS | Status: AC
  Administered 2022-01-25: 500 mg via INTRAVENOUS
  Filled 2022-01-25: qty 5

## 2022-01-25 MED ORDER — TRAMADOL HCL 50 MG PO TABS
50.0000 mg | ORAL_TABLET | Freq: Once | ORAL | Status: AC
  Administered 2022-01-25: 50 mg via ORAL
  Filled 2022-01-25: qty 1

## 2022-01-25 MED ORDER — LEVOTHYROXINE SODIUM 25 MCG PO TABS
25.0000 ug | ORAL_TABLET | Freq: Every day | ORAL | Status: DC
  Administered 2022-01-26 – 2022-01-27 (×2): 25 ug via ORAL
  Filled 2022-01-25 (×2): qty 1

## 2022-01-25 MED ORDER — GUAIFENESIN ER 600 MG PO TB12
600.0000 mg | ORAL_TABLET | Freq: Two times a day (BID) | ORAL | Status: DC
  Administered 2022-01-26 – 2022-01-27 (×3): 600 mg via ORAL
  Filled 2022-01-25 (×3): qty 1

## 2022-01-25 MED ORDER — DICLOFENAC SODIUM 1 % EX GEL
2.0000 g | Freq: Four times a day (QID) | CUTANEOUS | Status: DC | PRN
  Administered 2022-01-26: 2 g via TOPICAL
  Filled 2022-01-25: qty 100

## 2022-01-25 MED ORDER — FERROUS SULFATE 325 (65 FE) MG PO TABS
325.0000 mg | ORAL_TABLET | Freq: Every day | ORAL | Status: DC
  Administered 2022-01-26 – 2022-01-27 (×2): 325 mg via ORAL
  Filled 2022-01-25 (×2): qty 1

## 2022-01-25 MED ORDER — VANCOMYCIN HCL IN DEXTROSE 1-5 GM/200ML-% IV SOLN: 1000.0000 mg | Freq: Once | INTRAVENOUS | Status: DC

## 2022-01-25 MED ORDER — LACTATED RINGERS IV SOLN: INTRAVENOUS | Status: DC

## 2022-01-25 MED ORDER — AMLODIPINE BESYLATE 10 MG PO TABS
10.0000 mg | ORAL_TABLET | Freq: Every day | ORAL | Status: DC
Start: 2022-01-26 — End: 2022-01-27
  Administered 2022-01-26 – 2022-01-27 (×2): 10 mg via ORAL
  Filled 2022-01-25 (×2): qty 1

## 2022-01-25 MED ORDER — COQ10 200 MG PO CAPS: 200.0000 mg | ORAL_CAPSULE | Freq: Every day | ORAL | Status: DC

## 2022-01-25 MED ORDER — SODIUM CHLORIDE 0.9 % IV SOLN: INTRAVENOUS | Status: DC

## 2022-01-25 MED ORDER — ACETAMINOPHEN 325 MG PO TABS: 650.0000 mg | ORAL_TABLET | Freq: Four times a day (QID) | ORAL | Status: DC | PRN

## 2022-01-25 MED ORDER — TRAMADOL HCL 50 MG PO TABS
50.0000 mg | ORAL_TABLET | Freq: Four times a day (QID) | ORAL | Status: DC | PRN
  Administered 2022-01-26 – 2022-01-27 (×2): 50 mg via ORAL
  Filled 2022-01-25 (×2): qty 1

## 2022-01-25 MED ORDER — HYDROCOD POLI-CHLORPHE POLI ER 10-8 MG/5ML PO SUER: 5.0000 mL | Freq: Two times a day (BID) | ORAL | Status: DC | PRN

## 2022-01-25 MED ORDER — SERTRALINE HCL 50 MG PO TABS
100.0000 mg | ORAL_TABLET | Freq: Every day | ORAL | Status: DC
  Administered 2022-01-26 – 2022-01-27 (×2): 100 mg via ORAL
  Filled 2022-01-25 (×2): qty 2

## 2022-01-25 MED ORDER — ONDANSETRON HCL 4 MG/2ML IJ SOLN: 4.0000 mg | Freq: Four times a day (QID) | INTRAMUSCULAR | Status: DC | PRN

## 2022-01-25 MED ORDER — FLECAINIDE ACETATE 50 MG PO TABS
50.0000 mg | ORAL_TABLET | Freq: Every day | ORAL | Status: DC
  Administered 2022-01-26 – 2022-01-27 (×2): 50 mg via ORAL
  Filled 2022-01-25 (×2): qty 1

## 2022-01-25 MED ORDER — OYSTER SHELL CALCIUM/D3 500-5 MG-MCG PO TABS
1.0000 | ORAL_TABLET | Freq: Every day | ORAL | Status: DC
  Administered 2022-01-26 – 2022-01-27 (×2): 1 via ORAL
  Filled 2022-01-25 (×2): qty 1

## 2022-01-25 MED ORDER — PANTOPRAZOLE SODIUM 40 MG PO TBEC
40.0000 mg | DELAYED_RELEASE_TABLET | Freq: Every day | ORAL | Status: DC
Start: 2022-01-26 — End: 2022-01-27
  Administered 2022-01-26 – 2022-01-27 (×2): 40 mg via ORAL
  Filled 2022-01-25 (×2): qty 1

## 2022-01-25 MED ORDER — ONDANSETRON HCL 4 MG PO TABS: 4.0000 mg | ORAL_TABLET | Freq: Four times a day (QID) | ORAL | Status: DC | PRN

## 2022-01-25 MED ORDER — BUMETANIDE 1 MG PO TABS
2.0000 mg | ORAL_TABLET | Freq: Two times a day (BID) | ORAL | Status: DC
  Administered 2022-01-26 – 2022-01-27 (×4): 2 mg via ORAL
  Filled 2022-01-25 (×4): qty 2

## 2022-01-25 MED ORDER — VANCOMYCIN HCL 2000 MG/400ML IV SOLN
2000.0000 mg | Freq: Once | INTRAVENOUS | Status: AC
  Administered 2022-01-26: 2000 mg via INTRAVENOUS
  Filled 2022-01-25: qty 400

## 2022-01-25 MED ORDER — ACETAMINOPHEN 650 MG RE SUPP: 650.0000 mg | Freq: Four times a day (QID) | RECTAL | Status: DC | PRN

## 2022-01-25 MED ORDER — TRAZODONE HCL 50 MG PO TABS
50.0000 mg | ORAL_TABLET | Freq: Every day | ORAL | Status: DC
  Administered 2022-01-26 (×2): 50 mg via ORAL
  Filled 2022-01-25 (×2): qty 1

## 2022-01-25 MED ORDER — ALPRAZOLAM 0.25 MG PO TABS
0.2500 mg | ORAL_TABLET | Freq: Two times a day (BID) | ORAL | Status: DC | PRN
  Administered 2022-01-26: 0.25 mg via ORAL
  Filled 2022-01-25: qty 1

## 2022-01-25 MED ORDER — MAGNESIUM HYDROXIDE 400 MG/5ML PO SUSP: 30.0000 mL | Freq: Every day | ORAL | Status: DC | PRN

## 2022-01-25 MED ORDER — IPRATROPIUM-ALBUTEROL 0.5-2.5 (3) MG/3ML IN SOLN
3.0000 mL | Freq: Four times a day (QID) | RESPIRATORY_TRACT | Status: DC
Start: 2022-01-26 — End: 2022-01-27
  Administered 2022-01-26 – 2022-01-27 (×4): 3 mL via RESPIRATORY_TRACT
  Filled 2022-01-25 (×6): qty 3

## 2022-01-25 MED ORDER — LORATADINE 10 MG PO TABS
10.0000 mg | ORAL_TABLET | Freq: Every day | ORAL | Status: DC
Start: 2022-01-26 — End: 2022-01-27
  Administered 2022-01-26 – 2022-01-27 (×2): 10 mg via ORAL
  Filled 2022-01-25 (×2): qty 1

## 2022-01-25 MED ORDER — SODIUM CHLORIDE 0.9 % IV SOLN
2.0000 g | INTRAVENOUS | Status: DC
  Administered 2022-01-26 – 2022-01-27 (×2): 2 g via INTRAVENOUS
  Filled 2022-01-25: qty 20
  Filled 2022-01-25: qty 2

## 2022-01-25 MED ORDER — LISINOPRIL 20 MG PO TABS
40.0000 mg | ORAL_TABLET | Freq: Every day | ORAL | Status: DC
  Administered 2022-01-26 – 2022-01-27 (×2): 40 mg via ORAL
  Filled 2022-01-25 (×2): qty 2

## 2022-01-25 MED ORDER — APIXABAN 5 MG PO TABS
5.0000 mg | ORAL_TABLET | Freq: Two times a day (BID) | ORAL | Status: DC
  Administered 2022-01-26 – 2022-01-27 (×4): 5 mg via ORAL
  Filled 2022-01-25 (×4): qty 1

## 2022-01-25 MED ORDER — ATORVASTATIN CALCIUM 20 MG PO TABS
40.0000 mg | ORAL_TABLET | Freq: Every day | ORAL | Status: DC
  Administered 2022-01-26: 40 mg via ORAL
  Filled 2022-01-25: qty 2

## 2022-01-25 MED ORDER — SODIUM CHLORIDE 0.9 % IV SOLN
2.0000 g | Freq: Once | INTRAVENOUS | Status: AC
  Administered 2022-01-25: 2 g via INTRAVENOUS
  Filled 2022-01-25: qty 12.5

## 2022-01-25 MED ORDER — CARVEDILOL 6.25 MG PO TABS
6.2500 mg | ORAL_TABLET | Freq: Two times a day (BID) | ORAL | Status: DC
  Administered 2022-01-26 – 2022-01-27 (×3): 6.25 mg via ORAL
  Filled 2022-01-25 (×3): qty 1

## 2022-01-25 MED ORDER — OXYCODONE-ACETAMINOPHEN 7.5-325 MG PO TABS
1.0000 | ORAL_TABLET | ORAL | Status: DC | PRN
  Administered 2022-01-26: 1 via ORAL
  Filled 2022-01-25: qty 1

## 2022-01-25 MED ORDER — POTASSIUM CHLORIDE CRYS ER 10 MEQ PO TBCR
40.0000 meq | EXTENDED_RELEASE_TABLET | Freq: Two times a day (BID) | ORAL | Status: DC
  Administered 2022-01-26 – 2022-01-27 (×4): 40 meq via ORAL
  Filled 2022-01-25 (×4): qty 4

## 2022-01-25 MED ORDER — TRAZODONE HCL 50 MG PO TABS: 25.0000 mg | ORAL_TABLET | Freq: Every evening | ORAL | Status: DC | PRN

## 2022-01-25 NOTE — ED Notes (Signed)
Lab called to add on troponin 

## 2022-01-25 NOTE — ED Notes (Signed)
ED Provider at bedside. 

## 2022-01-25 NOTE — ED Notes (Signed)
Pt asking about wait times, advised pt that there were still others ahead of her, pt requesting something for pain, offered Tylenol, stated that it doesn't work, and that she takes pain meds at home, secure chat sent to Dr. Jari Pigg

## 2022-01-25 NOTE — ED Triage Notes (Signed)
Pt comes pov from urgent care with bilat pneumonia per the xray. Has been feeling bad, body aches, shob for 7 days. Tried theraflu yesterday after daughter had the flu but wasn't getting better and went to UC today. UC sent pt here. Is on 4L [place] chronically-is at 93% on the 4L [place].

## 2022-01-25 NOTE — ED Notes (Signed)
ERMD at bedside , pt states wants to go home.  Pt called her sister for ride home. Pt decided will stay in hospital for admission

## 2022-01-25 NOTE — Progress Notes (Signed)
PHARMACY -  BRIEF ANTIBIOTIC NOTE   Pharmacy has received consult(s) for Cefepime and Vancomycin from an ED provider.  The patient's profile has been reviewed for ht/wt/allergies/indication/available labs.    One time order(s) placed for Cefepime 2 gm and Vancomycin 2 gm per pt wt: 85.3 kg.  Further antibiotics/pharmacy consults should be ordered by admitting physician if indicated.                       Thank you, Renda Rolls, PharmD, [facility] 01/25/2022 10:17 PM

## 2022-01-25 NOTE — ED Provider Notes (Signed)
[facility] Provider Note    Event Date/Time   First MD Initiated Contact with Patient 01/25/22 2145     (approximate)   History   Shortness of Breath   HPI  [name] is a 76 y.o. female  with CHF, COPD, Afib who is on 4 L chronically who comes in for concerns for bilateral pneumonia.  Patient's note from the urgent care was reviewed with her oxygen level was 91%.  Patient is day 7 after being positive for the flu.    I reviewed patient's CT 4/27 where she had groundglass opacities concerning for infection and was started on doxycycline.  She reports that she then was around a family member who was positive for the flu and she never got testing for the flu but was placed on Tamiflu 7 days ago.  She reports having completed that course but still feeling terrible with increasing shortness of breath, coughing up mucus congestion and just overall unwell.  She went to an urgent care who is x-ray was concerning for pneumonia versus pulmonary vascular congestion.  She reports being compliant with her diuretics and denies any leg swelling so she feels like its not related to the fluid.  I reviewed the x-ray read from today where it was noted that patient has pulmonary vascular congestion   Physical Exam   Triage Vital Signs: ED Triage Vitals [01/25/22 1555]  Enc Vitals Group     BP 109/67     Pulse Rate 75     Resp (!) 22     Temp 98.2 F (36.8 C)     Temp Source Oral     SpO2 94 %     Weight 188 lb (85.3 kg)     Height      Head Circumference      Peak Flow      Pain Score 0     Pain Loc      Pain Edu?      Excl. in [place]?     Most recent vital signs: Vitals:   01/25/22 1555 01/25/22 1938  BP: 109/67 (!) 144/84  Pulse: 75 74  Resp: (!) 22 20  Temp: 98.2 F (36.8 C)   SpO2: 94% 100%     General: Awake, no distress.  CV:  Good peripheral perfusion.  Resp:  Normal effort.  Clear lung Abd:  No distention.  Soft and nontender Other:  No  swelling in the legs   ED Results / Procedures / Treatments   Labs (all labs ordered are listed, but only abnormal results are displayed) Labs Reviewed  COMPREHENSIVE METABOLIC PANEL - Abnormal; Notable for the following components:      Result Value   Glucose, Bld 139 (*)    All other components within normal limits  CBC WITH DIFFERENTIAL/PLATELET - Abnormal; Notable for the following components:   Hemoglobin 11.3 (*)    HCT 35.9 (*)    All other components within normal limits  LACTIC ACID, PLASMA  URINALYSIS, ROUTINE W REFLEX MICROSCOPIC  TROPONIN I (HIGH SENSITIVITY)     EKG  My interpretation of EKG:  AV dual paced rhythm without any significant ST elevation, T wave version aVL.  I reviewed and compared to prior EKG which is similar  RADIOLOGY I cannot pull up in the x-ray from the urgent care by did review the CT scan on 4/27 2023 and personally evaluated it and interpreted it with evidence of right lower lobe consolidation  PROCEDURES:  Critical Care performed: No  .1-3 Lead EKG Interpretation Performed by: Vanessa Bourg, MD Authorized by: Vanessa Vineland, MD     Interpretation: normal     ECG rate:  70   ECG rate assessment: normal     Rhythm: sinus rhythm     Ectopy: none     Conduction: normal     MEDICATIONS ORDERED IN ED: Medications  traMADol (ULTRAM) tablet 50 mg (50 mg Oral Given 01/25/22 2133)     IMPRESSION / MDM / ASSESSMENT AND PLAN / ED COURSE  I reviewed the triage vital signs and the nursing notes.  Patient comes in with concern for worsening symptoms with recently being treated with pneumonia and the flu with CT scan concerning for consolidations.  Do not feel like this represents pulmonary embolism given she is on Eliquis do not feel agreed to repeat CT imaging.  Could also be fluid I have added on BNP but her leg exam does not suggest that.  We discussed trialing outpatient management with different antibiotics versus coming in for IV  antibiotics and given a prolonged course patient felt more comfortable being admitted which I think is reasonable given her chronic conditions.  Added on coverage for MRSA given recent flu.  We will discuss with the hospital team for admission.   To note patient had asked me to take my mask off in order to talk to her given she is very hard of hearing but explained to patient that it is difficult for me to be able to do that given patient was recently diagnosed with flu.  I have instead gotten a another nurse who is more comfortable taking her mask off and she was able to translate for me  Troponin is negative Lactate is normal CMP is normal CBC shows stable hemoglobin and stable white count    The patient is on the cardiac monitor to evaluate for evidence of arrhythmia and/or significant heart rate changes.      FINAL CLINICAL IMPRESSION(S) / ED DIAGNOSES   Final diagnoses:  Community acquired pneumonia, unspecified laterality     Rx / DC Orders   ED Discharge Orders     None        Note:  This document was prepared using Dragon voice recognition software and may include unintentional dictation errors.   Vanessa , MD 01/25/22 [phone]

## 2022-01-25 NOTE — ED Notes (Signed)
One set of cultures sent

## 2022-01-26 ENCOUNTER — Other Ambulatory Visit: Payer: Self-pay

## 2022-01-26 DIAGNOSIS — J9611 Chronic respiratory failure with hypoxia: Secondary | ICD-10-CM | POA: Diagnosis not present

## 2022-01-26 DIAGNOSIS — J189 Pneumonia, unspecified organism: Secondary | ICD-10-CM | POA: Diagnosis not present

## 2022-01-26 DIAGNOSIS — I482 Chronic atrial fibrillation, unspecified: Secondary | ICD-10-CM | POA: Diagnosis not present

## 2022-01-26 DIAGNOSIS — I5032 Chronic diastolic (congestive) heart failure: Secondary | ICD-10-CM | POA: Diagnosis not present

## 2022-01-26 LAB — BASIC METABOLIC PANEL
Anion gap: 7 (ref 5–15)
BUN: 19 mg/dL (ref 8–23)
CO2: 27 mmol/L (ref 22–32)
Calcium: 9.1 mg/dL (ref 8.9–10.3)
Chloride: 105 mmol/L (ref 98–111)
Creatinine, Ser: 0.74 mg/dL (ref 0.44–1.00)
GFR, Estimated: >60 mL/min (ref >60)
Glucose, Bld: 106 mg/dL — ABNORMAL HIGH (ref 70–99)
Potassium: 4.1 mmol/L (ref 3.5–5.1)
Sodium: 139 mmol/L (ref 135–145)

## 2022-01-26 LAB — CBC
HCT: 36.2 % (ref 36.0–46.0)
Hemoglobin: 11.2 g/dL — ABNORMAL LOW (ref 12.0–15.0)
MCH: 27.7 pg (ref 26.0–34.0)
MCHC: 30.9 g/dL (ref 30.0–36.0)
MCV: 89.4 fL (ref 80.0–100.0)
Platelets: 239 10*3/uL (ref 150–400)
RBC: 4.05 MIL/uL (ref 3.87–5.11)
RDW: 14.9 % (ref 11.5–15.5)
WBC: 6.5 10*3/uL (ref 4.0–10.5)
nRBC: 0 % (ref 0.0–0.2)

## 2022-01-26 LAB — PROCALCITONIN: Procalcitonin: <0.1 ng/mL

## 2022-01-26 NOTE — Assessment & Plan Note (Addendum)
Appears to be euvolemic.  Continue her home diuretics

## 2022-01-26 NOTE — Assessment & Plan Note (Signed)
Patient meets criteria with BMI greater than 35 and comorbidity of CHF and hypertension

## 2022-01-26 NOTE — Assessment & Plan Note (Addendum)
Stable, no evidence of acute exacerbation.  Continue as needed DuoNebs

## 2022-01-26 NOTE — Assessment & Plan Note (Signed)
-   Continue home medications 

## 2022-01-26 NOTE — ED Notes (Signed)
Request made for transport to the floor ?

## 2022-01-26 NOTE — Progress Notes (Signed)
[facility] Progress Note  Patient: [name]    KDT:[id number]  DOA: 01/25/2022    Date of Service: the patient was seen and examined on 01/26/2022  Brief hospital course: 76 year old female with past medical history of diastolic CHF, COPD with chronic respiratory failure on 4 L nasal cannula, hypertension, paroxysmal A-fib, hypothyroidism and severe aortic stenosis from bicuspid aortic valve status post bioprosthetic valve replacement who presented to the emergency room on 5/21 with complaints of shortness of breath and productive cough x10 days.  Patient had been treated as an outpatient on 5 days of doxycycline for atypical pneumonia based off of outpatient CT of chest followed by 5-day course of Tamiflu as one of her family members had the flu and still no improvement.  Work-up in the emergency room revealed bilateral pneumonia and patient was admitted to the hospitalist service.  Assessment and Plan: Assessment and Plan: * Bilateral pneumonia Failed outpatient therapy.  Continue Rocephin and Zithromax.  As needed bronchodilators and mucolytic therapy.  Would favor continuing IV antibiotics x1 more day and can discharge home in the morning.  Noted normal procalcitonin levels.  Chronic obstructive pulmonary disease (COPD) ([name]) Stable, no evidence of acute exacerbation.  Continue as needed DuoNebs  Chronic respiratory failure with hypoxia ([name]) Appears to be at baseline on 4 L nasal cannula  Atrial fibrillation, chronic ([name]) - We will continue Eliquis, flecainide and Coreg.  Chronic diastolic CHF (congestive heart failure) ([name]) Appears to be euvolemic.  Continue her home diuretics  Depression Continue home medications.  HLD (hyperlipidemia) Continue statin  Essential hypertension - We will continue her antihypertensives.  Morbid obesity ([place]) Patient meets criteria with BMI greater than 35 and comorbidity of CHF and hypertension       Body mass index is 39.16  kg/m.        Consultants: None  Procedures: None  Antimicrobials: IV Rocephin and Zithromax 5/21 - 5/23 Prior to hospitalization, completed 5-day course of Tamiflu and 5 days of p.o. doxycycline  Code Status: Full code   Subjective: Patient complains of being cold  Objective: Vital signs were reviewed and unremarkable. Vitals:   01/26/22 0452 01/26/22 0829  BP: 132/61 (!) 146/71  Pulse: 72 74  Resp: 18 18  Temp: 97.6 F (36.4 C) 97.6 F (36.4 C)  SpO2: 96% 100%    Intake/Output Summary (Last 24 hours) at 01/26/2022 1251 Last data filed at 01/26/2022 0209 Gross per 24 hour  Intake 1149.16 ml  Output --  Net 1149.16 ml   Filed Weights   01/25/22 1555 01/26/22 0141  Weight: 85.3 kg 85 kg   Body mass index is 39.16 kg/m.  Exam:  General: Alert and oriented x3, no acute distress, fatigued HEENT: Normocephalic, atraumatic, mucous membranes are dry Cardiovascular: Regular rate and rhythm, S1-S2 Respiratory: Decreased breath sounds bibasilar Abdomen: Soft, obese, nontender, positive bowel sounds Musculoskeletal: No clubbing or cyanosis or edema Skin: No skin breaks, tears or lesions Psychiatry: Flattened affect.  No evidence of acute psychoses  Data Reviewed: Noted normal procalcitonin level. 5/22 labs otherwise unremarkable Disposition:  Status is: Inpatient Remains inpatient appropriate because: Continued need for IV antibiotics    Anticipated discharge date: 5/23  Family Communication: Left message for family DVT Prophylaxis:  apixaban Arne [place]) tablet 5 mg    Author: Annita Brod ,MD 01/26/2022 12:51 PM  To reach On-call, see care teams to locate the attending and reach out via [email or web address]. Between 7PM-7AM, please contact night-coverage If you still have difficulty reaching  the attending provider, please page the [facility] (Director on Call) for [facility] on amion for assistance.

## 2022-01-26 NOTE — Hospital Course (Signed)
76 year old female with past medical history of diastolic CHF, COPD with chronic respiratory failure on 4 L nasal cannula, hypertension, paroxysmal A-fib, hypothyroidism and severe aortic stenosis from bicuspid aortic valve status post bioprosthetic valve replacement who presented to the emergency room on 5/21 with complaints of shortness of breath and productive cough x10 days.  Patient had been treated as an outpatient on 5 days of doxycycline for atypical pneumonia based off of outpatient CT of chest followed by 5-day course of Tamiflu as one of her family members had the flu and still no improvement.  Work-up in the emergency room revealed bilateral pneumonia and patient was admitted to the hospitalist service.

## 2022-01-26 NOTE — Assessment & Plan Note (Signed)
-   We will continue Eliquis, flecainide and Coreg.

## 2022-01-26 NOTE — Assessment & Plan Note (Signed)
-   We will continue her antihypertensives. 

## 2022-01-26 NOTE — Assessment & Plan Note (Signed)
Appears to be at baseline on 4 L nasal cannula

## 2022-01-26 NOTE — Assessment & Plan Note (Signed)
Continue statin. 

## 2022-01-26 NOTE — Assessment & Plan Note (Addendum)
Failed outpatient therapy.  Continue Rocephin and Zithromax.  As needed bronchodilators and mucolytic therapy.  Would favor continuing IV antibiotics x1 more day and can discharge home in the morning.  Noted normal procalcitonin levels.

## 2022-01-26 NOTE — Telephone Encounter (Signed)
error 

## 2022-01-26 NOTE — TOC Initial Note (Signed)
Transition of Care [facility]) - Initial/Assessment Note    Patient Details  Name: [name] MRN: [id number] Date of Birth: [birth year]  [facility]) CM/SW Contact:    Pete Pelt, RN Phone Number: 01/26/2022, 4:17 PM  Clinical Narrative:      Patient is active with [facility] as per Floydene Flock.  Patient asking to discharge today.  As per hospitalist, patient is exprected to discharged tomorrow with home health              Expected Discharge Plan: [place] Barriers to Discharge: Continued Medical Work up   Patient Goals and CMS Choice Patient states their goals for this hospitalization and ongoing recovery are:: To go back home      Expected Discharge Plan and Services Expected Discharge Plan: [place]   Discharge Planning Services: CM Consult   Living arrangements for the past 2 months: [place]: RN [facility] - [place] Agency: [place] ([place]) Date HH Agency Contacted: 01/26/22 Time HH Agency Contacted: 1616 Representative spoke with at [place]: Floydene Flock  Prior Living Arrangements/Services Living arrangements for the past 2 months: [place] with:: Self Patient language and need for interpreter reviewed:: Yes Do you feel safe going back to the place where you live?: Yes      Need for Family Participation in Patient Care: Yes (Comment) Care giver support system in place?: Yes (comment) Current home services: Home RN Criminal Activity/Legal Involvement Pertinent to Current Situation/Hospitalization: No - Comment as needed  Activities of Daily Living Home Assistive Devices/Equipment: Eyeglasses, Oxygen ADL Screening (condition at time of admission) Patient's cognitive ability adequate to safely complete daily activities?: Yes Is the patient deaf or have difficulty hearing?: Yes Does the patient have difficulty seeing, even when  wearing glasses/contacts?: No Does the patient have difficulty concentrating, remembering, or making decisions?: No Patient able to express need for assistance with ADLs?: Yes Does the patient have difficulty dressing or bathing?: No Independently performs ADLs?: Yes (appropriate for developmental age) Does the patient have difficulty walking or climbing stairs?: Yes Weakness of Legs: Both Weakness of Arms/Hands: Left  Permission Sought/Granted Permission sought to share information with : Case Manager Permission granted to share information with : Yes, Verbal Permission Granted     Permission granted to share info w AGENCY: [place]        Emotional Assessment Appearance:: Appears stated age Attitude/Demeanor/Rapport: Engaged Affect (typically observed): Appropriate Orientation: : Oriented to Self, Oriented to Place, Oriented to  Time, Oriented to Situation Alcohol / Substance Use: Not Applicable Psych Involvement: No (comment)  Admission diagnosis:  Bilateral pneumonia [J18.9] Community acquired pneumonia, unspecified laterality [J18.9] Patient Active Problem List   Diagnosis Date Noted   Atrial fibrillation, chronic ([place]) 01/26/2022   Morbid obesity ([place]) 01/26/2022   Chronic respiratory failure with hypoxia ([place]) 01/26/2022   Bilateral pneumonia 01/25/2022   Chronic intractable headache 47/65/4650   Acute metabolic encephalopathy 35/46/5681   Chronic diastolic CHF (congestive heart failure) ([place]) 12/02/2021   HLD (hyperlipidemia) 12/02/2021   Stroke ([place]) 12/02/2021   Iron deficiency anemia 12/02/2021   Depression 12/02/2021   Acute on chronic respiratory failure with hypoxia ([place]) 11/11/2021   Unable to maintain body in lying position 10/16/2021   Orthopnea 10/16/2021  Class 2 obesity with alveolar hypoventilation, serious comorbidity, and body mass index (BMI) of 39.0 to 39.9 in adult ([place]) 10/16/2021   At high risk for postoperative complications  96/28/3662   Hearing loss 08/12/2021   Long term prescription benzodiazepine use 04/30/2021   Chronic shoulder pain (1ry area of Pain) (Bilateral) (L>R) 04/30/2021   Chronic upper extremity pain (2ry area of Pain) (Bilateral) (L>R) 04/30/2021   Cervicalgia 04/30/2021   Chronic neck pain (3ry area of Pain) (Posterior) (Bilateral) (L>R) 04/30/2021   Shoulder blade pain (4th area of Pain) (Left) 04/30/2021   Osteoarthritis of glenohumeral joint (Left) 04/30/2021   Osteoarthritis of AC (acromioclavicular) joint (Left) 04/30/2021   Osteoarthritis of glenohumeral joints (Bilateral) 04/30/2021   Osteoarthritis of acromioclavicular joints (Bilateral) 04/30/2021   Primary osteoarthritis of shoulders (Bilateral) 04/30/2021   DDD (degenerative disc disease), cervical 04/30/2021   Cervical radiculitis (Left) 04/30/2021   Cervical radiculopathy (Left) 04/30/2021   C6 radiculopathy (Left) 04/30/2021   C7 radiculopathy (Left) 04/30/2021   Wheelchair dependence 04/30/2021   Chronic pain syndrome 04/29/2021   Pharmacologic therapy 04/29/2021   Disorder of skeletal system 04/29/2021   Problems influencing health status 04/29/2021   Chronic anticoagulation (Eliquis) 03/14/2021   Hx of atrioventricular node ablation 03/14/2021   Symptomatic anemia 01/29/2021   Acute on chronic diastolic CHF (congestive heart failure) ([place]) 05/11/2020   Chronic obstructive pulmonary disease (COPD) ([place])    Hypothyroidism    Cardiac pacemaker in situ 05/10/2020   Acute non-recurrent frontal sinusitis 04/22/2020   Vertigo 04/22/2020   S/P placement of cardiac pacemaker 02/21/2020   Atrial fibrillation status post cardioversion ([place]) 11/16/2019   Episode of moderate major depression ([place]) 10/10/2019   Persistent atrial fibrillation ([place])    Encounter for general adult medical examination with abnormal findings 07/10/2019   Encounter for screening mammogram for malignant neoplasm of breast 07/10/2019   Atopic dermatitis  05/02/2019   Paroxysmal atrial flutter ([place]) 08/11/2018   Encounter for long-term (current) use of medications 07/09/2018   Chronic left shoulder pain 07/09/2018   Conjunctivitis 07/09/2018   Oxygen dependent 07/09/2018   GAD (generalized anxiety disorder) 07/09/2018   Ovarian failure 07/09/2018   Positive colorectal cancer screening using Cologuard test 04/24/2018   Calculus of gallbladder with acute on chronic cholecystitis 04/08/2018   H/O: CVA (cerebrovascular accident) 04/08/2018   Dysuria 03/02/2018   Right upper quadrant abdominal tenderness without rebound tenderness 03/02/2018   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 exacerbation ([place]) 03/02/2018   Chronic obstructive pulmonary disease ([place]) 09/19/2017   Typical atrial flutter ([name])    Irritable bowel syndrome without diarrhea 01/29/2015   Essential hypertension 01/24/2015   Paroxysmal atrial fibrillation ([place]) 01/24/2015   History of aortic valve replacement with bioprosthetic valve 01/24/2015   Atrial fibrillation with RVR ([place]) 01/11/2015   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 05/18/2014   Shoulder impingement syndrome (Left) 05/18/2014   Cellulitis and abscess 02/13/2014   MRSA (methicillin resistant Staphylococcus aureus) 02/13/2014   Aneurysm, ascending aorta ([place]) 06/23/2013   Bicuspid aortic valve 06/23/2013   PCP:  [name], MD Pharmacy:   [facility] [place], [address] [place] [address] Phone: [phone] Fax: [phone]  [facility] [place], [place] - [address] [place] [address] Phone: [phone] Fax: [phone]     Social Determinants of Health (SDOH) Interventions    Readmission Risk  Interventions     View : No data to display.

## 2022-01-26 NOTE — H&P (Addendum)
[place]   PATIENT NAME: [name]    MR#:  [id number]  DATE OF BIRTH:  Jan 23, 1946  DATE OF ADMISSION:  01/25/2022  PRIMARY CARE PHYSICIAN: [name], MD   Patient is coming from: Home  REQUESTING/REFERRING PHYSICIAN: Marjean Donna, MD  CHIEF COMPLAINT:   Chief Complaint  Patient presents with   Shortness of Breath    HISTORY OF PRESENT ILLNESS:  [name] is a 76 y.o. Caucasian female with medical history significant for CHF, chronic respiratory failure on home O2 at 4 L/min with COPD, essential hypertension, hypothyroidism, dyslipidemia and paroxysmal atrial fibrillation with history of aortic stenosis due to bicuspid aortic valve status post 2 bioprosthetic valve replacement, who presented to the emergency room with acute onset of worsening dyspnea with associated malaise and fatigue with cough productive of yellowish sputum over the last 10 days.  She was treated on outpatient basis with doxycycline for suspected atypical pneumonia on an outpatient CT of the chest.  This was followed by 5-day course of Tamiflu as one of her family members had the flu.  She was still continuing to have worsening symptoms.  She admits to chills but did not check her temperature.  She has been having nausea without vomiting.  She was having left shoulder pain and generalized aching all over.  No dysuria, oliguria or hematuria or flank pain.  ED Course: When she came to the ER, vital signs were within normal except for respiratory rate of 22. Labs revealed unremarkable CMP and her BNP was 127.6.  Positive troponin was 8 and lactic acid 1.5.  CBC showed mild anemia.  Influenza antigens and COVID-19 PCR came back negative.   EKG as reviewed by me : EKG showed AV dual paced rhythm with a rate of 70. Imaging: Outpatient chest x-ray left heart failure and pulmonary vascular congestion and was showing bilateral basal opacities.  The patient was given IV cefepime and vancomycin and Zithromax  as well as hydration with IV lactated Ringer 150 mill per hour as well as 50 mg of p.o. tramadol.  She will be admitted to a medical telemetry bed for further evaluation and management. PAST MEDICAL HISTORY:   Past Medical History:  Diagnosis Date   Aortic 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 s/p bioprosthetic valve replacement 2008 at [facility];  b. 01/2015 Echo: EF 60-65%, no rwma, Gr1 DD, mildly dil LA, nl RV fxn.   Aspiration pneumonia ([name])    Atrial fibrillation with RVR ([place]) 01/11/2015   Atrial flutter ([place])    a. 08/2016 s/p DCCV.  Remains on flecainide 50 mg bid.   Basal skull fracture ([name]) 20 yrs ago   CHF (congestive heart failure) ([name])    Chronic respiratory failure ([name])    COPD (chronic obstructive pulmonary disease) ([place])    a. 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 2008 prior to Aortic aneurysm repair-->nl cors.   History of stress test    a. 10/2015 MV: no ischemia/infarct.   HLD (hyperlipidemia)    HTN (hypertension)    Hypothyroidism    Obesity    PAF (paroxysmal atrial fibrillation) ([name])    a. on Eliquis; b. CHADS2VASc = 3 (HTN, age x 59, female).   Paroxysmal atrial fibrillation ([place]) 01/24/2015   Right  upper quadrant abdominal tenderness without rebound tenderness 03/02/2018   S/P ascending aortic aneurysm repair 2008    PAST SURGICAL HISTORY:   Past Surgical History:  Procedure Laterality Date   ABDOMINAL AORTIC ANEURYSM REPAIR  2008   ABDOMINAL HYSTERECTOMY     AORTIC VALVE REPLACEMENT  2008   CARDIAC CATHETERIZATION     [place]   CARDIOVERSION N/A 08/15/2018   Procedure: CARDIOVERSION;  Surgeon: [name], MD;  Location: [facility] ORS;  Service: Cardiovascular;  Laterality: N/A;   CARDIOVERSION N/A 11/14/2018   Procedure: CARDIOVERSION (CATH LAB);  Surgeon: [name], MD;  Location: [place] [facility];  Service: Cardiovascular;   Laterality: N/A;   CARDIOVERSION N/A 06/05/2019   Procedure: CARDIOVERSION;  Surgeon: [name], MD;  Location: [place] [facility];  Service: Cardiovascular;  Laterality: N/A;   CARDIOVERSION N/A 08/14/2019   Procedure: CARDIOVERSION;  Surgeon: [name], MD;  Location: [place] [facility];  Service: Cardiovascular;  Laterality: N/A;   CARDIOVERSION N/A 11/09/2019   Procedure: CARDIOVERSION;  Surgeon: Isaias Cowman, MD;  Location: [place] [facility];  Service: Cardiovascular;  Laterality: N/A;   CARDIOVERSION N/A 01/17/2020   Procedure: CARDIOVERSION;  Surgeon: Isaias Cowman, MD;  Location: [facility] ORS;  Service: Cardiovascular;  Laterality: N/A;   CARPAL TUNNEL RELEASE     ELECTROPHYSIOLOGIC STUDY N/A 08/17/2016   Procedure: Cardioversion;  Surgeon: [name], MD;  Location: [facility] ORS;  Service: Cardiovascular;  Laterality: N/A;   TUMOR EXCISION Left    x3 (arm)    SOCIAL HISTORY:   Social History   Tobacco Use   Smoking status: Former    Types: Cigarettes   Smokeless tobacco: Never  Substance Use Topics   Alcohol use: No    FAMILY HISTORY:   Family History  Problem Relation Age of Onset   Stroke Father    Stroke Paternal Grandmother     DRUG ALLERGIES:   Allergies  Allergen Reactions   Benadryl [Diphenhydramine Hcl (Sleep)] Palpitations   Cetirizine Palpitations   Lasix [Furosemide] Rash   Levaquin [Levofloxacin In D5w] Other (See Comments)    Reaction:  Fatigue and muscle soreness   Meloxicam Rash   Soy Allergy Hives and Nausea And Vomiting   Sulfa Antibiotics Rash    REVIEW OF SYSTEMS:   ROS As per history of present illness. All pertinent systems were reviewed above. Constitutional, HEENT, cardiovascular, respiratory, GI, GU, musculoskeletal, neuro, psychiatric, endocrine, integumentary and hematologic systems were reviewed and are otherwise negative/unremarkable except for positive findings mentioned above in the HPI.   MEDICATIONS AT HOME:   Prior to  Admission medications   Medication Sig Start Date End Date Taking? Authorizing Provider  albuterol (VENTOLIN HFA) 108 (90 Base) MCG/ACT inhaler Inhale 2 puffs into the lungs every 6 (six) hours as needed for wheezing or shortness of breath. 10/27/21  Yes Merlyn Lot, MD  ALPRAZolam Duanne Moron) 0.25 MG tablet Take 1 tablet (0.25 mg total) by mouth 2 (two) times daily as needed for anxiety. 11/25/21  Yes Abernathy, [name], NP  amLODipine (NORVASC) 10 MG tablet Take 1 tablet by mouth daily. 07/08/21 07/08/22 Yes [provider]  apixaban (ELIQUIS) 5 MG TABS tablet Take 1 tablet (5 mg total) by mouth 2 (two) times daily. 11/04/21  Yes [name], MD  atorvastatin (LIPITOR) 40 MG tablet 1 tab po qhs 10/01/21  Yes [name], MD  Budeson-Glycopyrrol-Formoterol (BREZTRI AEROSPHERE) 160-9-4.8 MCG/ACT AERO Inhale 2 puffs into the lungs 2 (two) times daily. 01/22/22  Yes Jonetta Osgood, NP  bumetanide (BUMEX) 1 MG tablet TAKE 2 TABLETS BY MOUTH TWICE DAILY 08/31/21  Yes Darylene Price A, FNP  Calcium Carbonate-Vitamin D (CALCIUM 600+D PO) Take 1 tablet by mouth daily.   Yes [provider]  carvedilol (COREG) 6.25 MG tablet Take 1 tablet (6.25 mg total) by mouth 2 (two) times daily with a meal. 12/03/21  Yes Dwyane Dee, MD  Coenzyme Q10 (COQ10) 200 MG CAPS Take 200 mg by mouth daily.   Yes [provider]  diclofenac Sodium (VOLTAREN) 1 % GEL SMARTSIG:Gram(s) Topical Twice Daily 09/09/20  Yes [provider]  doxycycline (VIBRA-TABS) 100 MG tablet Take 1 tablet (100 mg total) by mouth 2 (two) times daily. 01/06/22  Yes [name], MD  ferrous sulfate 325 (65 FE) MG tablet Take 1 tablet (325 mg total) by mouth daily with breakfast. 12/03/21  Yes Dwyane Dee, MD  flecainide (TAMBOCOR) 50 MG tablet Take 50 mg by mouth daily. 12/25/21  Yes [provider]  levothyroxine (SYNTHROID) 25 MCG tablet Take 1 tablet (25 mcg total) by mouth daily before breakfast. 09/29/21   Yes [name], MD  lisinopril (ZESTRIL) 40 MG tablet TAKE 1 TABLET(40 MG) BY MOUTH DAILY(DOSE INCREASE) 09/29/21  Yes Darylene Price A, FNP  loratadine (CLARITIN) 10 MG tablet Take 10 mg by mouth daily.   Yes [provider]  oseltamivir (TAMIFLU) 75 MG capsule Take 1 capsule (75 mg total) by mouth 2 (two) times daily. 01/20/22  Yes [name], MD  oxyCODONE-acetaminophen (PERCOCET) 7.5-325 MG tablet Take one tab in am and half in pm for severe shoulder pain 01/23/22  Yes Abernathy, [name], NP  pantoprazole (PROTONIX) 40 MG tablet Take 1 tablet (40 mg total) by mouth daily. 10/01/21  Yes [name], MD  potassium chloride (KLOR-CON) 10 MEQ tablet Take 4 tablets (40 mEq total) by mouth 2 (two) times daily. 10/06/21  Yes Abernathy, Yetta Flock, NP  sertraline (ZOLOFT) 100 MG tablet Take one tab a day for Anxiety 11/04/21  Yes [name], MD  sodium chloride (OCEAN) 0.65 % SOLN nasal spray Place 1 spray into both nostrils as needed for congestion.   Yes [provider]  traMADol Veatrice Bourbon) 50 MG tablet Take one tab po bid for pain 11/04/21  Yes [name], MD  traZODone (DESYREL) 50 MG tablet Take 1 tablet (50 mg total) by mouth at bedtime. 10/01/21  Yes [name], MD  amoxicillin (AMOXIL) 500 MG capsule Take 4 cap po 1 hrs before dental procedure Patient not taking: Reported on 01/25/2022 10/13/21   Jonetta Osgood, NP      VITAL SIGNS:  Blood pressure (!) 149/63, pulse 70, temperature 97.9 F (36.6 C), temperature source Oral, resp. rate 18, height 4\' 10"  (1.473 m), weight 85 kg, SpO2 100 %.  PHYSICAL EXAMINATION:  Physical Exam  GENERAL:  76 y.o.-year-old patient lying in the bed with no acute distress.  EYES: Pupils equal, round, reactive to light and accommodation. No scleral icterus. Extraocular muscles intact.  HEENT: Head atraumatic, normocephalic. Oropharynx and nasopharynx clear.  NECK:  Supple, no jugular venous distention. No thyroid enlargement, no  tenderness.  LUNGS: Diminished bibasilar breath sounds with bibasal crackles.  No use of accessory muscles of respiration.  CARDIOVASCULAR: Regular rate and rhythm, S1, S2 normal.  2/6 systolic ejection murmur with no rubs, or gallops.  ABDOMEN: Soft, nondistended, nontender. Bowel sounds present. No organomegaly or mass.  EXTREMITIES: No pedal edema, cyanosis, or clubbing.  NEUROLOGIC: Cranial nerves  II through XII are intact. Muscle strength 5/5 in all extremities. Sensation intact. Gait not checked.  PSYCHIATRIC: The patient is alert and oriented x 3.  Normal affect and good eye contact. SKIN: No obvious rash, lesion, or ulcer.   LABORATORY PANEL:   CBC Recent Labs  Lab 01/25/22 1558  WBC 6.3  HGB 11.3*  HCT 35.9*  PLT 268   ------------------------------------------------------------------------------------------------------------------  Chemistries  Recent Labs  Lab 01/25/22 1558  NA 138  K 3.7  CL 102  CO2 28  GLUCOSE 139*  BUN 17  CREATININE 0.85  CALCIUM 9.4  AST 19  ALT 15  ALKPHOS 87  BILITOT 0.4   ------------------------------------------------------------------------------------------------------------------  Cardiac Enzymes No results for input(s): TROPONINI in the last 168 hours. ------------------------------------------------------------------------------------------------------------------  RADIOLOGY:  No results found.    IMPRESSION AND PLAN:  Assessment and Plan: * Bilateral pneumonia - The patient will be admitted to a medical telemetry bed. - The patient has chronic hypoxic respiratory failure requiring 4 L of O2 by nasal cannula that is currently at baseline.- We will continue antibiotic therapy with IV Rocephin and Zithromax. - Continue hydration with IV normal saline. - The patient will be placed on mucolytic therapy. - We will place on as needed bronchodilator therapy as well. - We will follow blood cultures.  Chronic obstructive  pulmonary disease (COPD) ([place]) - We will place her on as needed scheduled DuoNebs. - She has no current clear exacerbation.  Atrial fibrillation, chronic ([name]) - We will continue Eliquis, flecainide and Coreg.  Chronic diastolic CHF (congestive heart failure) ([place]) - She does not seem to have a clinical acute exacerbation. - We will continue her diuretic therapy though.  Essential hypertension - We will continue her antihypertensives.   DVT prophylaxis: Lovenox.  Advanced Care Planning:  Code Status: She is DNR/DNI.  This was discussed with her. Family Communication:  The plan of care was discussed in details with the patient (and family). I answered all questions. The patient agreed to proceed with the above mentioned plan. Further management will depend upon hospital course. Disposition Plan: Back to previous home environment Consults called: none.  All the records are reviewed and case discussed with ED provider.  Status is: Inpatient  At the time of the admission, it appears that the appropriate admission status for this patient is inpatient.  This is judged to be reasonable and necessary in order to provide the required intensity of service to ensure the patient's safety given the presenting symptoms, physical exam findings and initial radiographic and laboratory data in the context of comorbid conditions.  The patient requires inpatient status due to high intensity of service, high risk of further deterioration and high frequency of surveillance required.  I certify that at the time of admission, it is my clinical judgment that the patient will require inpatient hospital care extending more than 2 midnights.                            Dispo: The patient is from: Home              Anticipated d/c is to: Home              Patient currently is not medically stable to d/c.              Difficult to place patient: No  Christel Mormon M.D on 01/26/2022 at 4:15 AM  [facility]    From  7 PM-7 AM, contact night-coverage [email or web address]  CC: Primary care physician; [name], MD

## 2022-01-27 DIAGNOSIS — J189 Pneumonia, unspecified organism: Secondary | ICD-10-CM | POA: Diagnosis not present

## 2022-01-27 DIAGNOSIS — I482 Chronic atrial fibrillation, unspecified: Secondary | ICD-10-CM | POA: Diagnosis not present

## 2022-01-27 DIAGNOSIS — I5032 Chronic diastolic (congestive) heart failure: Secondary | ICD-10-CM | POA: Diagnosis not present

## 2022-01-27 DIAGNOSIS — J449 Chronic obstructive pulmonary disease, unspecified: Secondary | ICD-10-CM | POA: Diagnosis not present

## 2022-01-27 LAB — URINALYSIS, ROUTINE W REFLEX MICROSCOPIC
Bilirubin Urine: NEGATIVE
Glucose, UA: NEGATIVE mg/dL
Hgb urine dipstick: NEGATIVE
Ketones, ur: NEGATIVE mg/dL
Leukocytes,Ua: NEGATIVE
Nitrite: NEGATIVE
Protein, ur: NEGATIVE mg/dL
Specific Gravity, Urine: 1.011 (ref 1.005–1.030)
pH: 6 (ref 5.0–8.0)

## 2022-01-27 MED ORDER — GUAIFENESIN ER 600 MG PO TB12: 600.0000 mg | ORAL_TABLET | Freq: Two times a day (BID) | ORAL | 0 refills | Status: AC

## 2022-01-27 MED ORDER — IPRATROPIUM-ALBUTEROL 0.5-2.5 (3) MG/3ML IN SOLN
3.0000 mL | Freq: Four times a day (QID) | RESPIRATORY_TRACT | Status: DC
  Administered 2022-01-27: 3 mL via RESPIRATORY_TRACT
  Filled 2022-01-27: qty 3

## 2022-01-27 MED ORDER — AMOXICILLIN-POT CLAVULANATE 875-125 MG PO TABS
1.0000 | ORAL_TABLET | Freq: Two times a day (BID) | ORAL | 0 refills | Status: AC
Start: 2022-01-27 — End: 2022-02-01

## 2022-01-27 MED ORDER — LOPERAMIDE HCL 2 MG PO CAPS
4.0000 mg | ORAL_CAPSULE | Freq: Four times a day (QID) | ORAL | 0 refills | Status: AC | PRN
Start: 2022-01-27 — End: 2022-02-03

## 2022-01-27 MED ORDER — LOPERAMIDE HCL 2 MG PO CAPS
4.0000 mg | ORAL_CAPSULE | Freq: Once | ORAL | Status: AC
  Administered 2022-01-27: 4 mg via ORAL
  Filled 2022-01-27: qty 2

## 2022-01-27 MED ORDER — LACTINEX PO CHEW: 1.0000 | CHEWABLE_TABLET | Freq: Three times a day (TID) | ORAL | 0 refills | Status: AC

## 2022-01-27 NOTE — Discharge Summary (Signed)
Physician Discharge Summary   Patient: [name] MRN: [id number] DOB: Jul 19, 1946  Admit date:     01/25/2022  Discharge date: 01/27/22  Discharge Physician: Max Sane   PCP: [name], MD   Recommendations at discharge:   Follow-up with outpatient providers as requested  Discharge Diagnoses: Principal Problem:   Bilateral pneumonia Active Problems:   Chronic obstructive pulmonary disease (COPD) ([name])   Chronic respiratory failure with hypoxia ([name])   Chronic diastolic CHF (congestive heart failure) ([name])   Atrial fibrillation, chronic ([name])   Depression   HLD (hyperlipidemia)   Essential hypertension   Hypothyroidism   Wheelchair dependence   Morbid obesity [facility])  Hospital Course: 76 year old female with past medical history of diastolic CHF, COPD with chronic respiratory failure on 4 L nasal cannula, hypertension, paroxysmal A-fib, hypothyroidism and severe aortic stenosis from bicuspid aortic valve status post bioprosthetic valve replacement who presented to the emergency room on 5/21 with complaints of shortness of breath and productive cough x10 days.  Patient had been treated as an outpatient on 5 days of doxycycline for atypical pneumonia based off of outpatient CT of chest followed by 5-day course of Tamiflu as one of her family members had the flu and still no improvement.  Work-up in the emergency room revealed bilateral pneumonia and patient was admitted to the hospitalist service.  Assessment and Plan: * Bilateral pneumonia This could be viral as her procalcitonin is less than 0.1.  I have explained to patient that antibiotic may not be making much difference.  She is having some loose stools I have instructed her to keep antibiotic for future use if need be but avoid unnecessary use as she could get a side effects of antibiotics including C. difficile.  As patient was adamant in wanting to leave and keeping lactobacillus/probiotic, loperamide and some antibiotic  at hand I have prescribed it at discharge.  Chronic obstructive pulmonary disease (COPD) ([name]) Chronic respiratory failure with hypoxia ([name]) Appears to be at baseline on 4 L nasal cannula  Atrial fibrillation, chronic ([name]) - ontinue Eliquis, flecainide and Coreg.  Chronic diastolic CHF (congestive heart failure) ([name]) Appears to be euvolemic.  Continue her home diuretics  Depression Continue home medications.  HLD (hyperlipidemia) Continue statin  Essential hypertension - continue her antihypertensives.  Morbid obesity ([place]) Patient meets criteria with BMI greater than 35 and comorbidity of CHF and hypertension         Disposition: Home Diet recommendation:  Discharge Diet Orders (From admission, onward)     Start     Ordered   01/27/22 0000  Diet - low sodium heart healthy        01/27/22 1110           Carb modified diet DISCHARGE MEDICATION: Allergies as of 01/27/2022       Reactions   Benadryl [diphenhydramine Hcl (sleep)] Palpitations   Cetirizine Palpitations   Lasix [furosemide] Rash   Levaquin [levofloxacin In D5w] Other (See Comments)   Reaction:  Fatigue and muscle soreness   Meloxicam Rash   Soy Allergy Hives, Nausea And Vomiting   Sulfa Antibiotics Rash        Medication List     STOP taking these medications    amoxicillin 500 MG capsule Commonly known as: AMOXIL   doxycycline 100 MG tablet Commonly known as: VIBRA-TABS   oseltamivir 75 MG capsule Commonly known as: TAMIFLU       TAKE these medications    albuterol 108 (90 Base)  MCG/ACT inhaler Commonly known as: Ventolin HFA Inhale 2 puffs into the lungs every 6 (six) hours as needed for wheezing or shortness of breath.   ALPRAZolam 0.25 MG tablet Commonly known as: XANAX Take 1 tablet (0.25 mg total) by mouth 2 (two) times daily as needed for anxiety.   amLODipine 10 MG tablet Commonly known as: NORVASC Take 1 tablet by mouth daily.   amoxicillin-clavulanate  875-125 MG tablet Commonly known as: AUGMENTIN Take 1 tablet by mouth 2 (two) times daily for 5 days.   apixaban 5 MG Tabs tablet Commonly known as: Eliquis Take 1 tablet (5 mg total) by mouth 2 (two) times daily.   atorvastatin 40 MG tablet Commonly known as: LIPITOR 1 tab po qhs   Breztri Aerosphere 160-9-4.8 MCG/ACT Aero Generic drug: Budeson-Glycopyrrol-Formoterol Inhale 2 puffs into the lungs 2 (two) times daily.   bumetanide 1 MG tablet Commonly known as: BUMEX TAKE 2 TABLETS BY MOUTH TWICE DAILY   CALCIUM 600+D PO Take 1 tablet by mouth daily.   carvedilol 6.25 MG tablet Commonly known as: COREG Take 1 tablet (6.25 mg total) by mouth 2 (two) times daily with a meal.   CoQ10 200 MG Caps Take 200 mg by mouth daily.   diclofenac Sodium 1 % Gel Commonly known as: VOLTAREN SMARTSIG:Gram(s) Topical Twice Daily   ferrous sulfate 325 (65 FE) MG tablet Take 1 tablet (325 mg total) by mouth daily with breakfast.   flecainide 50 MG tablet Commonly known as: TAMBOCOR Take 50 mg by mouth daily.   guaiFENesin 600 MG 12 hr tablet Commonly known as: MUCINEX Take 1 tablet (600 mg total) by mouth 2 (two) times daily for 7 days.   lactobacillus acidophilus & bulgar chewable tablet Chew 1 tablet by mouth 3 (three) times daily with meals for 7 days.   levothyroxine 25 MCG tablet Commonly known as: SYNTHROID Take 1 tablet (25 mcg total) by mouth daily before breakfast.   lisinopril 40 MG tablet Commonly known as: ZESTRIL TAKE 1 TABLET(40 MG) BY MOUTH DAILY(DOSE INCREASE)   loperamide 2 MG capsule Commonly known as: IMODIUM Take 2 capsules (4 mg total) by mouth 4 (four) times daily as needed for up to 7 days for diarrhea or loose stools.   loratadine 10 MG tablet Commonly known as: CLARITIN Take 10 mg by mouth daily.   oxyCODONE-acetaminophen 7.5-325 MG tablet Commonly known as: Percocet Take one tab in am and half in pm for severe shoulder pain   pantoprazole 40  MG tablet Commonly known as: PROTONIX Take 1 tablet (40 mg total) by mouth daily.   potassium chloride 10 MEQ tablet Commonly known as: KLOR-CON Take 4 tablets (40 mEq total) by mouth 2 (two) times daily.   sertraline 100 MG tablet Commonly known as: ZOLOFT Take one tab a day for Anxiety   sodium chloride 0.65 % Soln nasal spray Commonly known as: OCEAN Place 1 spray into both nostrils as needed for congestion.   traMADol 50 MG tablet Commonly known as: ULTRAM Take one tab po bid for pain   traZODone 50 MG tablet Commonly known as: DESYREL Take 1 tablet (50 mg total) by mouth at bedtime.        Follow-up Information     [name], MD. Go on 02/03/2022.   Specialties: Internal Medicine, Cardiology Why: @ 10am Contact information: [place] [address] [phone]         Clabe Seal, PA-C. Schedule an appointment as soon as possible  for a visit on 02/05/2022.   Why: @ 3:45 Contact information: [place] [address] [phone]         Allyne Gee, MD. Daphane Shepherd on 02/03/2022.   Specialties: Internal Medicine, Pulmonary Disease Why: Patient to make own follow up appt No answer at office Contact information: [place] [address] [phone]                Discharge Exam: Danley Danker Weights   01/25/22 1555 01/26/22 0141  Weight: 85.3 kg 78 kg   76 year old morbidly obese female lying in the bed comfortably without any acute distress Lungs clear to auscultation bilaterally, no wheezing or rhonchi Cardiovascular regular rate and rhythm Abdomen soft, benign Neuro alert and oriented Skin no rash or lesion  Condition at discharge: fair  The results of significant diagnostics from this hospitalization (including imaging, microbiology, ancillary and laboratory) are listed below for reference.   Imaging Studies: CT Chest High Resolution  Result Date: 01/01/2022 CLINICAL DATA:   Chronic lung disease EXAM: CT CHEST WITHOUT CONTRAST TECHNIQUE: Multidetector CT imaging of the chest was performed following the standard protocol without intravenous contrast. High resolution imaging of the lungs, as well as inspiratory and expiratory imaging, was performed. RADIATION DOSE REDUCTION: This exam was performed according to the departmental dose-optimization program which includes automated exposure control, adjustment of the mA and/or kV according to patient size and/or use of iterative reconstruction technique. COMPARISON:  Chest CT dated November 14, 2021 FINDINGS: Cardiovascular: Cardiomegaly. No pericardial effusion. Prior aortic valve replacement atherosclerotic disease of the thoracic aorta. Pacer with leads in the right atrium and right ventricle. Coronary artery calcifications of the LAD and circumflex. Mediastinum/Nodes: Esophagus and thyroid are unremarkable. No pathologically enlarged lymph nodes seen in the chest. Lungs/Pleura: Central airways are patent. Centrilobular emphysema. No evidence of air trapping. Centrilobular emphysema. Interval resolution of left upper lobe consolidation. Persistent peripheral consolidation of the right lower lobe with surrounding ground-glass. Solid pulmonary nodule of the right upper lobe measuring 8 mm on series 10, image 63, unchanged in size when compared with prior exam. Upper Abdomen: Cholelithiasis.  No acute abnormality. Musculoskeletal: No chest wall mass or suspicious bone lesions identified. IMPRESSION: 1. Persistent peripheral consolidation and ground-glass opacities of the right lower lobe with interval resolution of left upper lobe consolidation, findings are likely due to infectious or inflammatory process. Recommend additional follow-up chest CT in 3 months to ensure complete resolution. 2. Stable solid pulmonary nodule the right upper lobe. Recommend attention on follow-up. 3. Aortic Atherosclerosis (ICD10-I70.0) and Emphysema (ICD10-J43.9).  Electronically Signed   By: Yetta Glassman M.D.   On: 01/01/2022 21:20    Microbiology: Results for orders placed or performed during the hospital encounter of 01/25/22  Resp Panel by RT-PCR (Flu A&B, Covid) Nasopharyngeal Swab     Status: None   Collection Time: 01/25/22 10:06 PM   Specimen: Nasopharyngeal Swab; Nasopharyngeal(NP) swabs in vial transport medium  Result Value Ref Range Status   SARS Coronavirus 2 by RT PCR NEGATIVE NEGATIVE Final    Comment: (NOTE) SARS-CoV-2 target nucleic acids are NOT DETECTED.  The SARS-CoV-2 RNA is generally detectable in upper respiratory specimens during the acute phase of infection. The lowest concentration of SARS-CoV-2 viral copies this assay can detect is 138 copies/mL. A negative result does not preclude SARS-Cov-2 infection and should not be used as the sole basis for treatment or other patient management decisions. A negative result may occur with  improper  specimen collection/handling, submission of specimen other than nasopharyngeal swab, presence of viral mutation(s) within the areas targeted by this assay, and inadequate number of viral copies(<138 copies/mL). A negative result must be combined with clinical observations, patient history, and epidemiological information. The expected result is Negative.  Fact Sheet for Patients:  EntrepreneurPulse.com.au  Fact Sheet for Healthcare Providers:  IncredibleEmployment.be  This test is no t yet approved or cleared by the [place] FDA and  has been authorized for detection and/or diagnosis of SARS-CoV-2 by FDA under an Emergency Use Authorization (EUA). This EUA will remain  in effect (meaning this test can be used) for the duration of the COVID-19 declaration under Section 564(b)(1) of the Act, 21 U.S.C.section 360bbb-3(b)(1), unless the authorization is terminated  or revoked sooner.       Influenza A by PCR NEGATIVE NEGATIVE Final    Influenza B by PCR NEGATIVE NEGATIVE Final    Comment: (NOTE) The Xpert Xpress SARS-CoV-2/FLU/RSV plus assay is intended as an aid in the diagnosis of influenza from Nasopharyngeal swab specimens and should not be used as a sole basis for treatment. Nasal washings and aspirates are unacceptable for Xpert Xpress SARS-CoV-2/FLU/RSV testing.  Fact Sheet for Patients: EntrepreneurPulse.com.au  Fact Sheet for Healthcare Providers: IncredibleEmployment.be  This test is not yet approved or cleared by the [place] FDA and has been authorized for detection and/or diagnosis of SARS-CoV-2 by FDA under an Emergency Use Authorization (EUA). This EUA will remain in effect (meaning this test can be used) for the duration of the COVID-19 declaration under Section 564(b)(1) of the Act, 21 U.S.C. section 360bbb-3(b)(1), unless the authorization is terminated or revoked.  Performed at [facility], [name] [place], [place] [address]     Labs: CBC: Recent Labs  Lab 01/25/22 1558 01/26/22 0355  WBC 6.3 6.5  NEUTROABS 4.3  --   HGB 11.3* 11.2*  HCT 35.9* 36.2  MCV 89.5 89.4  PLT 268 850   Basic Metabolic Panel: Recent Labs  Lab 01/25/22 1558 01/26/22 0355  NA 138 139  K 3.7 4.1  CL 102 105  CO2 28 27  GLUCOSE 139* 106*  BUN 17 19  CREATININE 0.85 0.74  CALCIUM 9.4 9.1   Liver Function Tests: Recent Labs  Lab 01/25/22 1558  AST 19  ALT 15  ALKPHOS 87  BILITOT 0.4  PROT 6.8  ALBUMIN 3.7   CBG: No results for input(s): GLUCAP in the last 168 hours.  Discharge time spent: greater than 30 minutes.  Signed: Max Sane, MD [facility] 01/27/2022

## 2022-01-27 NOTE — Progress Notes (Signed)
Patient being discharged home. IV removed. Went over discharge instructions and medications with patient. Patient going home POV with sister.

## 2022-01-28 ENCOUNTER — Other Ambulatory Visit: Payer: Self-pay

## 2022-01-28 ENCOUNTER — Telehealth: Payer: Self-pay

## 2022-01-28 DIAGNOSIS — I5033 Acute on chronic diastolic (congestive) heart failure: Secondary | ICD-10-CM

## 2022-01-28 NOTE — Patient Outreach (Signed)
[facility]) Care Management  01/28/2022  [name] 1946/08/24 [id number]   Received hospital referral from Netta Cedars, RN for RN case manager for chronic case management services. Assigned patient to Valente David, RN care coordinator for follow up.  [place] Management Assistant [phone]

## 2022-01-28 NOTE — Telephone Encounter (Signed)
Pt called and was stating that she did not want physical therapy coming to her house bc she said she knew what to do as they have been to her house several times in the past.  I informed pt that I believe it was ordered from where she was in hospital and she would have to either wait for them to call her and tell them she is refusing service or call hospital and find out who they assigned to her that we didn't make the referral

## 2022-01-29 ENCOUNTER — Other Ambulatory Visit: Payer: Self-pay | Admitting: *Deleted

## 2022-01-29 ENCOUNTER — Ambulatory Visit: Payer: Medicare Other

## 2022-01-29 ENCOUNTER — Other Ambulatory Visit: Payer: Self-pay

## 2022-01-29 MED ORDER — IPRATROPIUM-ALBUTEROL 0.5-2.5 (3) MG/3ML IN SOLN: 3.0000 mL | RESPIRATORY_TRACT | 3 refills | Status: DC | PRN

## 2022-01-29 NOTE — Patient Outreach (Signed)
[facility]) Care Management  01/29/2022  ASUNCION TAPSCOTT 02/13/1946 [id number]   Referral Date: 5/24 Referral Source: Hospital liaison Referral Reason: Post hospital discharge (CAP) Insurance: [place] attempt #1, successful.  Identity verified.  This care manager introduced self and stated purpose of call.  [facility] care management services explained.    Member declines assistance.  Benefits of [facility] explained, state she already has nurses coming into the home, does not want additional visits.  Advised that [facility] is a telephone program, state she does not want calls.  Reiterated benefits of program, she again declines and abruptly ends call.  Plan: RN CM will close case at this time, declines participation.  Valente David, RN, MSN, [place] Manager [phone]

## 2022-01-30 DIAGNOSIS — G4733 Obstructive sleep apnea (adult) (pediatric): Secondary | ICD-10-CM | POA: Diagnosis not present

## 2022-02-03 ENCOUNTER — Encounter: Payer: Self-pay | Admitting: Internal Medicine

## 2022-02-03 ENCOUNTER — Other Ambulatory Visit: Payer: Self-pay | Admitting: Internal Medicine

## 2022-02-03 ENCOUNTER — Ambulatory Visit: Payer: Medicare Other | Admitting: Internal Medicine

## 2022-02-03 ENCOUNTER — Ambulatory Visit: Payer: Medicare Other | Admitting: Pain Medicine

## 2022-02-03 VITALS — BP 136/70 | HR 75 | Temp 98.3°F | Resp 16 | Ht <= 58 in | Wt 186.4 lb

## 2022-02-03 DIAGNOSIS — J189 Pneumonia, unspecified organism: Secondary | ICD-10-CM | POA: Diagnosis not present

## 2022-02-03 DIAGNOSIS — I700 Atherosclerosis of aorta: Secondary | ICD-10-CM | POA: Diagnosis not present

## 2022-02-03 DIAGNOSIS — M79641 Pain in right hand: Secondary | ICD-10-CM

## 2022-02-03 DIAGNOSIS — F411 Generalized anxiety disorder: Secondary | ICD-10-CM

## 2022-02-03 DIAGNOSIS — J449 Chronic obstructive pulmonary disease, unspecified: Secondary | ICD-10-CM | POA: Diagnosis not present

## 2022-02-03 MED ORDER — ALPRAZOLAM 0.25 MG PO TABS: 0.2500 mg | ORAL_TABLET | Freq: Two times a day (BID) | ORAL | 1 refills | Status: DC | PRN

## 2022-02-03 NOTE — Progress Notes (Signed)
[facility] [place], [place] [address]  Internal MEDICINE  Office Visit Note  Patient Name: [name]  [id number]  [id number]  Date of Service: 02/03/2022   TCM   Chief Complaint  Patient presents with   Hospitalization Follow-up     HPI Pt is here for recent hospital follow up. CT chest  IMPRESSION: 1. Persistent peripheral consolidation and ground-glass opacities of the right lower lobe with interval resolution of left upper lobe consolidation, findings are likely due to infectious or inflammatory process. Recommend additional follow-up chest CT in 3 months to ensure complete resolution. 2. Stable solid pulmonary nodule the right upper lobe. Recommend attention on follow-up. 3. Aortic Atherosclerosis (ICD10-I70.0) and Emphysema (ICD10-J43.9).  Was admitted for pneumonia from 5/21- 5/23 past medical history of diastolic CHF, COPD with chronic respiratory failure on 4 L nasal cannula, hypertension, paroxysmal A-fib, hypothyroidism and severe aortic stenosis from bicuspid aortic valve status post bioprosthetic valve replacement who presented to the emergency room on 5/21 with complaints of shortness of breath and productive cough x10 days.  Patient had been treated as an outpatient on 5 days of doxycycline for atypical pneumonia based off of outpatient CT of chest followed by 5-day course of Tamiflu as one of her family members had the flu and still no improvement.  Work-up in the emergency room revealed bilateral pneumonia and patient was admitted to the hospitalist service. This could be viral as her procalcitonin is less than 0.1 Current Medication: Outpatient Encounter Medications as of 02/03/2022  Medication Sig   albuterol (VENTOLIN HFA) 108 (90 Base) MCG/ACT inhaler Inhale 2 puffs into the lungs every 6 (six) hours as needed for wheezing or shortness of breath.   ALPRAZolam (XANAX) 0.25 MG tablet Take 1 tablet (0.25 mg total) by mouth 2 (two)  times daily as needed for anxiety.   amLODipine (NORVASC) 10 MG tablet Take 1 tablet by mouth daily.   apixaban (ELIQUIS) 5 MG TABS tablet Take 1 tablet (5 mg total) by mouth 2 (two) times daily.   atorvastatin (LIPITOR) 40 MG tablet 1 tab po qhs   Budeson-Glycopyrrol-Formoterol (BREZTRI AEROSPHERE) 160-9-4.8 MCG/ACT AERO Inhale 2 puffs into the lungs 2 (two) times daily.   bumetanide (BUMEX) 1 MG tablet TAKE 2 TABLETS BY MOUTH TWICE DAILY   Calcium Carbonate-Vitamin D (CALCIUM 600+D PO) Take 1 tablet by mouth daily.   carvedilol (COREG) 6.25 MG tablet Take 1 tablet (6.25 mg total) by mouth 2 (two) times daily with a meal.   Coenzyme Q10 (COQ10) 200 MG CAPS Take 200 mg by mouth daily.   diclofenac Sodium (VOLTAREN) 1 % GEL SMARTSIG:Gram(s) Topical Twice Daily   ferrous sulfate 325 (65 FE) MG tablet Take 1 tablet (325 mg total) by mouth daily with breakfast.   flecainide (TAMBOCOR) 50 MG tablet Take 50 mg by mouth daily.   guaiFENesin (MUCINEX) 600 MG 12 hr tablet Take 1 tablet (600 mg total) by mouth 2 (two) times daily for 7 days.   ipratropium-albuterol (DUONEB) 0.5-2.5 (3) MG/3ML SOLN Inhale 3 mLs into the lungs every 4 (four) hours as needed. Inhale 3 mls into the lungs every 4 to 6 hours and as needed   lactobacillus acidophilus & bulgar (LACTINEX) chewable tablet Chew 1 tablet by mouth 3 (three) times daily with meals for 7 days.   levothyroxine (SYNTHROID) 25 MCG tablet Take 1 tablet (25 mcg total) by mouth daily before breakfast.   lisinopril (ZESTRIL) 40 MG tablet TAKE 1 TABLET(40 MG) BY  MOUTH DAILY(DOSE INCREASE)   loperamide (IMODIUM) 2 MG capsule Take 2 capsules (4 mg total) by mouth 4 (four) times daily as needed for up to 7 days for diarrhea or loose stools.   loratadine (CLARITIN) 10 MG tablet Take 10 mg by mouth daily.   oxyCODONE-acetaminophen (PERCOCET) 7.5-325 MG tablet Take one tab in am and half in pm for severe shoulder pain   pantoprazole (PROTONIX) 40 MG tablet Take 1  tablet (40 mg total) by mouth daily.   potassium chloride (KLOR-CON) 10 MEQ tablet Take 4 tablets (40 mEq total) by mouth 2 (two) times daily.   sertraline (ZOLOFT) 100 MG tablet Take one tab a day for Anxiety   sodium chloride (OCEAN) 0.65 % SOLN nasal spray Place 1 spray into both nostrils as needed for congestion.   traMADol (ULTRAM) 50 MG tablet Take one tab po bid for pain   traZODone (DESYREL) 50 MG tablet Take 1 tablet (50 mg total) by mouth at bedtime.   No facility-administered encounter medications on file as of 02/03/2022.    Surgical History: Past Surgical History:  Procedure Laterality Date   ABDOMINAL AORTIC ANEURYSM REPAIR  2008   ABDOMINAL HYSTERECTOMY     AORTIC VALVE REPLACEMENT  2008   CARDIAC CATHETERIZATION     [place]   CARDIOVERSION N/A 08/15/2018   Procedure: CARDIOVERSION;  Surgeon: [name], MD;  Location: [facility] ORS;  Service: Cardiovascular;  Laterality: N/A;   CARDIOVERSION N/A 11/14/2018   Procedure: CARDIOVERSION (CATH LAB);  Surgeon: [name], MD;  Location: [facility];  Service: Cardiovascular;  Laterality: N/A;   CARDIOVERSION N/A 06/05/2019   Procedure: CARDIOVERSION;  Surgeon: [name], MD;  Location: [facility];  Service: Cardiovascular;  Laterality: N/A;   CARDIOVERSION N/A 08/14/2019   Procedure: CARDIOVERSION;  Surgeon: [name], MD;  Location: [facility];  Service: Cardiovascular;  Laterality: N/A;   CARDIOVERSION N/A 11/09/2019   Procedure: CARDIOVERSION;  Surgeon: Isaias Cowman, MD;  Location: [facility];  Service: Cardiovascular;  Laterality: N/A;   CARDIOVERSION N/A 01/17/2020   Procedure: CARDIOVERSION;  Surgeon: Isaias Cowman, MD;  Location: [facility];  Service: Cardiovascular;  Laterality: N/A;   CARPAL TUNNEL RELEASE     ELECTROPHYSIOLOGIC STUDY N/A 08/17/2016   Procedure: Cardioversion;  Surgeon: [name], MD;  Location: [facility];  Service: Cardiovascular;  Laterality: N/A;   TUMOR EXCISION Left     x3 (arm)    Medical History: Past Medical History:  Diagnosis Date   Aortic 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 s/p bioprosthetic valve replacement 2008 at [facility];  b. 01/2015 Echo: EF 60-65%, no rwma, Gr1 DD, mildly dil LA, nl RV fxn.   Aspiration pneumonia ([place])    Atrial fibrillation with RVR ([place]) 01/11/2015   Atrial flutter ([place])    a. 08/2016 s/p DCCV.  Remains on flecainide 50 mg bid.   Basal skull fracture ([name]) 20 yrs ago   CHF (congestive heart failure) ([name])    Chronic respiratory failure ([name])    COPD (chronic obstructive pulmonary disease) ([place])    a. 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 2008 prior to Aortic aneurysm repair-->nl cors.   History of stress test    a. 10/2015 MV: no ischemia/infarct.   HLD (hyperlipidemia)    HTN (hypertension)  Hypothyroidism    Obesity    PAF (paroxysmal atrial fibrillation) ([name])    a. on Eliquis; b. CHADS2VASc = 3 (HTN, age x 1, female).   Paroxysmal atrial fibrillation ([name]) 01/24/2015   Right upper quadrant abdominal tenderness without rebound tenderness 03/02/2018   S/P ascending aortic aneurysm repair 2008    Family History: Family History  Problem Relation Age of Onset   Stroke Father    Stroke Paternal Grandmother     Social History   Socioeconomic History   Marital status: Single    Spouse name: Not on file   Number of children: Not on file   Years of education: Not on file   Highest education level: Not on file  Occupational History   Not on file  Tobacco Use   Smoking status: Former    Types: Cigarettes   Smokeless tobacco: Never  Vaping Use   Vaping Use: Never used  Substance and Sexual Activity   Alcohol use: No   Drug use: No   Sexual activity: Never    Birth control/protection: Surgical  Other Topics Concern   Not on file  Social History Narrative   Not on file    Social Determinants of Health   Financial Resource Strain: Low Risk    Difficulty of Paying Living Expenses: Not very hard  Food Insecurity: Not on file  Transportation Needs: Not on file  Physical Activity: Not on file  Stress: Not on file  Social Connections: Not on file  Intimate Partner Violence: Not on file      Review of Systems  Constitutional:  Negative for fatigue and fever.  HENT:  Negative for congestion, mouth sores and postnasal drip.   Respiratory:  Negative for cough.   Cardiovascular:  Negative for chest pain.  Genitourinary:  Negative for flank pain.  Psychiatric/Behavioral: Negative.      Vital Signs: BP 136/70   Pulse 75   Temp 98.3 F (36.8 C)   Resp 16   Ht 4\' 10"  (1.473 m)   Wt 186 lb 6.4 oz (84.6 kg)   SpO2 95% Comment: 4 liters oxygen  BMI 38.96 kg/m    Physical Exam Constitutional:      Appearance: Normal appearance.  HENT:     Head: Normocephalic and atraumatic.     Nose: Nose normal.     Mouth/Throat:     Mouth: Mucous membranes are moist.     Pharynx: No posterior oropharyngeal erythema.  Eyes:     Extraocular Movements: Extraocular movements intact.     Pupils: Pupils are equal, round, and reactive to light.  Cardiovascular:     Pulses: Normal pulses.     Heart sounds: Normal heart sounds.  Pulmonary:     Effort: Pulmonary effort is normal.     Breath sounds: Normal breath sounds.  Neurological:     General: No focal deficit present.     Mental Status: She is alert.  Psychiatric:        Mood and Affect: Mood normal.        Behavior: Behavior normal.       Assessment/Plan: 1. Pneumonia of both lower lobes due to infectious organism This could be viral as her procalcitonin is less than 0.1.  I have explained to patient that antibiotic may not be making much difference.  She is having some loose stools I have instructed her to keep antibiotic for future use if need be but avoid unnecessary use as she could get a side  effects of antibiotics including C. difficile.  As patient was adamant in wanting to leave and keeping lactobacillus/probiotic, loperamide and some antibiotic at hand I have prescribed it at discharge, follow up ct CHEST with Pulmonary   2. Aortic atherosclerosis ([name]) Continue Lipitor as before   3. GAD (generalized anxiety disorder) - ALPRAZolam (XANAX) 0.25 MG tablet; Take 1 tablet (0.25 mg total) by mouth 2 (two) times daily as needed for anxiety.  Dispense: 60 tablet; Refill: 1  4. Right hand pain Continue to apply ice, this was due to Phlebotomy, apply [place]  prn   General Counseling: Nalea verbalizes understanding of the findings of todays visit and agrees with plan of treatment. I have discussed any further diagnostic evaluation that may be needed or ordered today. We also reviewed her medications today. she has been encouraged to call the office with any questions or concerns that should arise related to todays visit.    Counseling:  [place] Controlled Substance Database was reviewed by me.  I have reviewed all medical records from hospital follow up including radiology reports and consults from other physicians. Appropriate follow up diagnostics will be scheduled as needed. Patient/ Family understands the plan of treatment. Time spent 55 minutes.   Dr [name], MD Internal Medicine

## 2022-02-04 ENCOUNTER — Telehealth: Payer: Self-pay

## 2022-02-04 NOTE — Telephone Encounter (Signed)
Informed pt that I was mailing a Patient Assistance Application for Eliquis.  I placed in mail.  I informed pt once all completed for her to bring it to the office and I will fax to the company

## 2022-02-10 ENCOUNTER — Telehealth: Payer: Self-pay | Admitting: Pain Medicine

## 2022-02-10 NOTE — Telephone Encounter (Signed)
Dr Dossie Arbour ordered CT CERVICAL SPINE WO CONTRAST which was scheduled 3 times but patient had pneumonia and could not keep appt. The order expired 02-07-22. Please ask Dr. Dossie Arbour to resend order so we can get patient in for the CT .

## 2022-02-17 ENCOUNTER — Ambulatory Visit: Payer: Medicare Other | Admitting: Internal Medicine

## 2022-02-17 ENCOUNTER — Telehealth: Payer: Self-pay

## 2022-02-17 ENCOUNTER — Encounter: Payer: Self-pay | Admitting: Internal Medicine

## 2022-02-17 VITALS — BP 126/66 | HR 70 | Temp 98.9°F | Resp 16 | Ht <= 58 in | Wt 188.0 lb

## 2022-02-17 DIAGNOSIS — R0602 Shortness of breath: Secondary | ICD-10-CM

## 2022-02-17 DIAGNOSIS — G4733 Obstructive sleep apnea (adult) (pediatric): Secondary | ICD-10-CM | POA: Diagnosis not present

## 2022-02-17 DIAGNOSIS — J189 Pneumonia, unspecified organism: Secondary | ICD-10-CM

## 2022-02-17 DIAGNOSIS — J9611 Chronic respiratory failure with hypoxia: Secondary | ICD-10-CM | POA: Diagnosis not present

## 2022-02-17 DIAGNOSIS — J849 Interstitial pulmonary disease, unspecified: Secondary | ICD-10-CM

## 2022-02-17 DIAGNOSIS — J449 Chronic obstructive pulmonary disease, unspecified: Secondary | ICD-10-CM | POA: Diagnosis not present

## 2022-02-17 NOTE — Patient Instructions (Signed)
Sleep Apnea Sleep apnea affects breathing during sleep. It causes breathing to stop for 10 seconds or more, or to become shallow. People with sleep apnea usually snore loudly. It can also increase the risk of: Heart attack. Stroke. Being very overweight (obese). Diabetes. Heart failure. Irregular heartbeat. High blood pressure. The goal of treatment is to help you breathe normally again. What are the causes?  The most common cause of this condition is a collapsed or blocked airway. There are three kinds of sleep apnea: Obstructive sleep apnea. This is caused by a blocked or collapsed airway. Central sleep apnea. This happens when the brain does not send the right signals to the muscles that control breathing. Mixed sleep apnea. This is a combination of obstructive and central sleep apnea. What increases the risk? Being overweight. Smoking. Having a small airway. Being older. Being female. Drinking alcohol. Taking medicines to calm yourself (sedatives or tranquilizers). Having family members with the condition. Having a tongue or tonsils that are larger than normal. What are the signs or symptoms? Trouble staying asleep. Loud snoring. Headaches in the morning. Waking up gasping. Dry mouth or sore throat in the morning. Being sleepy or tired during the day. If you are sleepy or tired during the day, you may also: Not be able to focus your mind (concentrate). Forget things. Get angry a lot and have mood swings. Feel sad (depressed). Have changes in your personality. Have less interest in sex, if you are female. Be unable to have an erection, if you are female. How is this treated?  Sleeping on your side. Using a medicine to get rid of mucus in your nose (decongestant). Avoiding the use of alcohol, medicines to help you relax, or certain pain medicines (narcotics). Losing weight, if needed. Changing your diet. Quitting smoking. Using a machine to open your airway while you  sleep, such as: An oral appliance. This is a mouthpiece that shifts your lower jaw forward. A CPAP device. This device blows air through a mask when you breathe out (exhale). An EPAP device. This has valves that you put in each nostril. A BIPAP device. This device blows air through a mask when you breathe in (inhale) and breathe out. Having surgery if other treatments do not work. Follow these instructions at home: Lifestyle Make changes that your doctor recommends. Eat a healthy diet. Lose weight if needed. Avoid alcohol, medicines to help you relax, and some pain medicines. Do not smoke or use any products that contain nicotine or tobacco. If you need help quitting, ask your doctor. General instructions Take over-the-counter and prescription medicines only as told by your doctor. If you were given a machine to use while you sleep, use it only as told by your doctor. If you are having surgery, make sure to tell your doctor you have sleep apnea. You may need to bring your device with you. Keep all follow-up visits. Contact a doctor if: The machine that you were given to use during sleep bothers you or does not seem to be working. You do not get better. You get worse. Get help right away if: Your chest hurts. You have trouble breathing in enough air. You have an uncomfortable feeling in your back, arms, or stomach. You have trouble talking. One side of your body feels weak. A part of your face is hanging down. These symptoms may be an emergency. Get help right away. Call your local emergency services (911 in the [place] Do not wait to see if the  symptoms will go away. Do not drive yourself to the hospital. Summary This condition affects breathing during sleep. The most common cause is a collapsed or blocked airway. The goal of treatment is to help you breathe normally while you sleep. This information is not intended to replace advice given to you by your health care provider. Make  sure you discuss any questions you have with your health care provider. Document Revised: 04/02/2021 Document Reviewed: 08/02/2020 Elsevier Patient Education  [place]. Chronic Obstructive Pulmonary Disease  Chronic obstructive pulmonary disease (COPD) is a long-term (chronic) lung problem. When you have COPD, it is hard for air to get in and out of your lungs. Usually the condition gets worse over time, and your lungs will never return to normal. There are things you can do to keep yourself as healthy as possible. What are the causes? Smoking. This is the most common cause. Certain genes passed from parent to child (inherited). What increases the risk? Being exposed to secondhand smoke from cigarettes, pipes, or cigars. Being exposed to chemicals and other irritants, such as fumes and dust in the work environment. Having chronic lung conditions or infections. What are the signs or symptoms? Shortness of breath, especially during physical activity. A long-term cough with a large amount of thick mucus. Sometimes, the cough may not have any mucus (dry cough). Wheezing. Breathing quickly. Skin that looks gray or blue, especially in the fingers, toes, or lips. Feeling tired (fatigue). Weight loss. Chest tightness. Having infections often. Episodes when breathing symptoms become much worse (exacerbations). At the later stages of this disease, you may have swelling in the ankles, feet, or legs. How is this treated? Taking medicines. Quitting smoking, if you smoke. Rehabilitation. This includes steps to make your body work better. It may involve a team of specialists. Doing exercises. Making changes to your diet. Using oxygen. Lung surgery. Lung transplant. Comfort measures (palliative care). Follow these instructions at home: Medicines Take over-the-counter and prescription medicines only as told by your doctor. Talk to your doctor before taking any cough or allergy  medicines. You may need to avoid medicines that cause your lungs to be dry. Lifestyle If you smoke, stop smoking. Smoking makes the problem worse. Do not smoke or use any products that contain nicotine or tobacco. If you need help quitting, ask your doctor. Avoid being around things that make your breathing worse. This may include smoke, chemicals, and fumes. Stay active, but remember to rest as well. Learn and use tips on how to manage stress and control your breathing. Make sure you get enough sleep. Most adults need at least 7 hours of sleep every night. Eat healthy foods. Eat smaller meals more often. Rest before meals. Controlled breathing Learn and use tips on how to control your breathing as told by your doctor. Try: Breathing in (inhaling) through your nose for 1 second. Then, pucker your lips and breath out (exhale) through your lips for 2 seconds. Putting one hand on your belly (abdomen). Breathe in slowly through your nose for 1 second. Your hand on your belly should move out. Pucker your lips and breathe out slowly through your lips. Your hand on your belly should move in as you breathe out.  Controlled coughing Learn and use controlled coughing to clear mucus from your lungs. Follow these steps: Lean your head a little forward. Breathe in deeply. Try to hold your breath for 3 seconds. Keep your mouth slightly open while coughing 2 times. Spit any mucus  out into a tissue. Rest and do the steps again 1 or 2 times as needed. General instructions Make sure you get all the shots (vaccines) that your doctor recommends. Ask your doctor about a flu shot and a pneumonia shot. Use oxygen therapy and pulmonary rehabilitation if told by your doctor. If you need home oxygen therapy, ask your doctor if you should buy a tool to measure your oxygen level (oximeter). Make a COPD action plan with your doctor. This helps you to know what to do if you feel worse than usual. Manage any other  conditions you have as told by your doctor. Avoid going outside when it is very hot, cold, or humid. Avoid people who have a sickness you can catch (contagious). Keep all follow-up visits. Contact a doctor if: You cough up more mucus than usual. There is a change in the color or thickness of the mucus. It is harder to breathe than usual. Your breathing is faster than usual. You have trouble sleeping. You need to use your medicines more often than usual. You have trouble doing your normal activities such as getting dressed or walking around the house. Get help right away if: You have shortness of breath while resting. You have shortness of breath that stops you from: Being able to talk. Doing normal activities. Your chest hurts for longer than 5 minutes. Your skin color is more blue than usual. Your pulse oximeter shows that you have low oxygen for longer than 5 minutes. You have a fever. You feel too tired to breathe normally. These symptoms may represent a serious problem that is an emergency. Do not wait to see if the symptoms will go away. Get medical help right away. Call your local emergency services (911 in the [place] Do not drive yourself to the hospital. Summary Chronic obstructive pulmonary disease (COPD) is a long-term lung problem. The way your lungs work will never return to normal. Usually the condition gets worse over time. There are things you can do to keep yourself as healthy as possible. Take over-the-counter and prescription medicines only as told by your doctor. If you smoke, stop. Smoking makes the problem worse. This information is not intended to replace advice given to you by your health care provider. Make sure you discuss any questions you have with your health care provider. Document Revised: 07/02/2020 Document Reviewed: 07/02/2020 Elsevier Patient Education  [place].

## 2022-02-17 NOTE — Telephone Encounter (Signed)
She states when she was here in January that DR. Dossie Arbour ordered a CT of her neck but she never got to go because she was in and out of the hospital. I cant find an order for a CT so if he wants her to have one please have him put in an order.

## 2022-02-17 NOTE — Progress Notes (Signed)
[facility] [place], [place] [address]  Pulmonary Sleep Medicine   Office Visit Note  Patient Name: [name] DOB: April 22, 1946 MRN [id number]  Date of Service: 02/17/2022  Complaints/HPI: She was in the hospital in end of may. Patient was diagnosed with bilateral pneumonia at this time. Patient was given abx feels better now. She had a CT chest done showing peripheral GGO with some clearing. Clinically she is doing better. She continues to be on her oxygen with good results. She has not yet gone to university for another opinion. Awaiting appointment. Right now denies cough and no sputum production. NO xray on file here. She had an xray at urgent care and was sent to the ED  ROS  General: (-) fever, (-) chills, (-) night sweats, (-) weakness Skin: (-) rashes, (-) itching,. Eyes: (-) visual changes, (-) redness, (-) itching. Nose and Sinuses: (-) nasal stuffiness or itchiness, (-) postnasal drip, (-) nosebleeds, (-) sinus trouble. Mouth and Throat: (-) sore throat, (-) hoarseness. Neck: (-) swollen glands, (-) enlarged thyroid, (-) neck pain. Respiratory: - cough, (-) bloody sputum, + shortness of breath, - wheezing. Cardiovascular: - ankle swelling, (-) chest pain. Lymphatic: (-) lymph node enlargement. Neurologic: (-) numbness, (-) tingling. Psychiatric: (-) anxiety, (-) depression   Current Medication: Outpatient Encounter Medications as of 02/17/2022  Medication Sig   albuterol (VENTOLIN HFA) 108 (90 Base) MCG/ACT inhaler Inhale 2 puffs into the lungs every 6 (six) hours as needed for wheezing or shortness of breath.   ALPRAZolam (XANAX) 0.25 MG tablet Take 1 tablet (0.25 mg total) by mouth 2 (two) times daily as needed for anxiety.   amLODipine (NORVASC) 10 MG tablet Take 1 tablet by mouth daily.   apixaban (ELIQUIS) 5 MG TABS tablet Take 1 tablet (5 mg total) by mouth 2 (two) times daily.   atorvastatin (LIPITOR) 40 MG tablet 1 tab po qhs    Budeson-Glycopyrrol-Formoterol (BREZTRI AEROSPHERE) 160-9-4.8 MCG/ACT AERO Inhale 2 puffs into the lungs 2 (two) times daily.   bumetanide (BUMEX) 1 MG tablet TAKE 2 TABLETS BY MOUTH TWICE DAILY   Calcium Carbonate-Vitamin D (CALCIUM 600+D PO) Take 1 tablet by mouth daily.   carvedilol (COREG) 6.25 MG tablet Take 1 tablet (6.25 mg total) by mouth 2 (two) times daily with a meal.   Coenzyme Q10 (COQ10) 200 MG CAPS Take 200 mg by mouth daily.   diclofenac Sodium (VOLTAREN) 1 % GEL SMARTSIG:Gram(s) Topical Twice Daily   ferrous sulfate 325 (65 FE) MG tablet Take 1 tablet (325 mg total) by mouth daily with breakfast.   flecainide (TAMBOCOR) 50 MG tablet Take 50 mg by mouth daily.   ipratropium-albuterol (DUONEB) 0.5-2.5 (3) MG/3ML SOLN Inhale 3 mLs into the lungs every 4 (four) hours as needed. Inhale 3 mls into the lungs every 4 to 6 hours and as needed   levothyroxine (SYNTHROID) 25 MCG tablet Take 1 tablet (25 mcg total) by mouth daily before breakfast.   lisinopril (ZESTRIL) 40 MG tablet TAKE 1 TABLET(40 MG) BY MOUTH DAILY(DOSE INCREASE)   loratadine (CLARITIN) 10 MG tablet Take 10 mg by mouth daily.   oxyCODONE-acetaminophen (PERCOCET) 7.5-325 MG tablet Take one tab in am and half in pm for severe shoulder pain   pantoprazole (PROTONIX) 40 MG tablet Take 1 tablet (40 mg total) by mouth daily.   potassium chloride (KLOR-CON) 10 MEQ tablet Take 4 tablets (40 mEq total) by mouth 2 (two) times daily.   sertraline (ZOLOFT) 100 MG tablet Take  one tab a day for Anxiety   sodium chloride (OCEAN) 0.65 % SOLN nasal spray Place 1 spray into both nostrils as needed for congestion.   traMADol (ULTRAM) 50 MG tablet Take one tab po bid for pain   traZODone (DESYREL) 50 MG tablet Take 1 tablet (50 mg total) by mouth at bedtime.   No facility-administered encounter medications on file as of 02/17/2022.    Surgical History: Past Surgical History:  Procedure Laterality Date   ABDOMINAL AORTIC ANEURYSM REPAIR   2008   ABDOMINAL HYSTERECTOMY     AORTIC VALVE REPLACEMENT  2008   CARDIAC CATHETERIZATION     [place]   CARDIOVERSION N/A 08/15/2018   Procedure: CARDIOVERSION;  Surgeon: [name], MD;  Location: [facility] ORS;  Service: Cardiovascular;  Laterality: N/A;   CARDIOVERSION N/A 11/14/2018   Procedure: CARDIOVERSION (CATH LAB);  Surgeon: [name], MD;  Location: [facility] OR[facility];  Service: Cardiovascular;  Laterality: N/A;   CARDIOVERSION N/A 06/05/2019   Procedure: CARDIOVERSION;  Surgeon: [name], MD;  Location: [facility] OR[facility];  Service: Cardiovascular;  Laterality: N/A;   CARDIOVERSION N/A 08/14/2019   Procedure: CARDIOVERSION;  Surgeon: [name], MD;  Location: [facility];  Service: Cardiovascular;  Laterality: N/A;   CARDIOVERSION N/A 11/09/2019   Procedure: CARDIOVERSION;  Surgeon: Isaias Cowman, MD;  Location: [facility];  Service: Cardiovascular;  Laterality: N/A;   CARDIOVERSION N/A 01/17/2020   Procedure: CARDIOVERSION;  Surgeon: Isaias Cowman, MD;  Location: [facility];  Service: Cardiovascular;  Laterality: N/A;   CARPAL TUNNEL RELEASE     ELECTROPHYSIOLOGIC STUDY N/A 08/17/2016   Procedure: Cardioversion;  Surgeon: [name], MD;  Location: [facility] OR[facility];  Service: Cardiovascular;  Laterality: N/A;   TUMOR EXCISION Left    x3 (arm)    Medical History: Past Medical History:  Diagnosis Date   Aortic 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 s/p bioprosthetic valve replacement 2008 [facility];  b. 01/2015 Echo: EF 60-65%, no rwma, Gr1 DD, mildly dil LA, nl RV fxn.   Aspiration pneumonia ([place])    Atrial fibrillation with RVR ([place]) 01/11/2015   Atrial flutter ([place])    a. 08/2016 s/p DCCV.  Remains on flecainide 50 mg bid.   Basal skull fracture ([name]) 20 yrs ago   CHF (congestive heart failure) ([name])    Chronic respiratory failure ([name])    COPD (chronic obstructive pulmonary disease) ([place])    a. 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 2008 prior to Aortic aneurysm repair-->nl cors.   History of stress test    a. 10/2015 MV: no ischemia/infarct.   HLD (hyperlipidemia)    HTN (hypertension)    Hypothyroidism    Obesity    PAF (paroxysmal atrial fibrillation) ([name])    a. on Eliquis; b. CHADS2VASc = 3 (HTN, age x 44, female).   Paroxysmal atrial fibrillation ([name]) 01/24/2015   Right upper quadrant abdominal tenderness without rebound tenderness 03/02/2018   S/P ascending aortic aneurysm repair 2008    Family History: Family History  Problem Relation Age of Onset   Stroke Father    Stroke Paternal Grandmother     Social History: Social History   Socioeconomic History   Marital status: Single    Spouse name: Not on file   Number of children: Not  on file   Years of education: Not on file   Highest education level: Not on file  Occupational History   Not on file  Tobacco Use   Smoking status: Former    Types: Cigarettes   Smokeless tobacco: Never  Vaping Use   Vaping Use: Never used  Substance and Sexual Activity   Alcohol use: No   Drug use: No   Sexual activity: Never    Birth control/protection: Surgical  Other Topics Concern   Not on file  Social History Narrative   Not on file   Social Determinants of Health   Financial Resource Strain: Low Risk  (08/04/2021)   Overall Financial Resource Strain (CARDIA)    Difficulty of Paying Living Expenses: Not very hard  Food Insecurity: Not on file  Transportation Needs: Not on file  Physical Activity: Not on file  Stress: Not on file  Social Connections: Not on file  Intimate Partner Violence: Not on file    Vital Signs: Blood pressure 126/66, pulse 70, temperature 98.9 F (37.2 C), resp. rate 16, height 4\' 10"  (1.473 m), weight 188 lb (85.3 kg), SpO2 94 %.  Examination: General Appearance: The patient is well-developed, well-nourished, and in no  distress. Skin: Gross inspection of skin unremarkable. Head: normocephalic, no gross deformities. Eyes: no gross deformities noted. ENT: ears appear grossly normal no exudates. Neck: Supple. No thyromegaly. No LAD. Respiratory: no rhonchi ntoed. Cardiovascular: Normal S1 and S2 without murmur or rub. Extremities: No cyanosis. pulses are equal. Neurologic: Alert and oriented. No involuntary movements.  LABS: Recent Results (from the past 2160 hour(s))  CBC with Differential     Status: Abnormal   Collection Time: 12/02/21  1:22 AM  Result Value Ref Range   WBC 7.0 4.0 - 10.5 K/uL   RBC 3.60 (L) 3.87 - 5.11 MIL/uL   Hemoglobin 10.3 (L) 12.0 - 15.0 g/dL   HCT 33.4 (L) 36.0 - 46.0 %   MCV 92.8 80.0 - 100.0 fL   MCH 28.6 26.0 - 34.0 pg   MCHC 30.8 30.0 - 36.0 g/dL   RDW 15.7 (H) 11.5 - 15.5 %   Platelets 262 150 - 400 K/uL   nRBC 0.0 0.0 - 0.2 %   Neutrophils Relative % 62 %   Neutro Abs 4.4 1.7 - 7.7 K/uL   Lymphocytes Relative 19 %   Lymphs Abs 1.3 0.7 - 4.0 K/uL   Monocytes Relative 15 %   Monocytes Absolute 1.1 (H) 0.1 - 1.0 K/uL   Eosinophils Relative 3 %   Eosinophils Absolute 0.2 0.0 - 0.5 K/uL   Basophils Relative 1 %   Basophils Absolute 0.1 0.0 - 0.1 K/uL   Immature Granulocytes 0 %   Abs Immature Granulocytes 0.02 0.00 - 0.07 K/uL    Comment: Performed at [facility], [address] [place],  [address]  Comprehensive metabolic panel     Status: Abnormal   Collection Time: 12/02/21  1:22 AM  Result Value Ref Range   Sodium 137 135 - 145 mmol/L   Potassium 4.1 3.5 - 5.1 mmol/L   Chloride 99 98 - 111 mmol/L   CO2 28 22 - 32 mmol/L   Glucose, Bld 119 (H) 70 - 99 mg/dL    Comment: Glucose reference range applies only to samples taken after fasting for at least 8 hours.   BUN 16 8 - 23 mg/dL   Creatinine, Ser 0.98 0.44 - 1.00 mg/dL   Calcium 8.7 (L) 8.9 - 10.3  mg/dL   Total Protein 6.7 6.5 - 8.1 g/dL   Albumin 3.8 3.5 - 5.0 g/dL   AST 23 15 - 41  U/L   ALT 13 0 - 44 U/L   Alkaline Phosphatase 71 38 - 126 U/L   Total Bilirubin 0.7 0.3 - 1.2 mg/dL   GFR, Estimated >60 >60 mL/min    Comment: (NOTE) Calculated using the CKD-EPI Creatinine Equation (2021)    Anion gap 10 5 - 15    Comment: Performed at [facility], [address] [place], [place] [address]  Ethanol     Status: None   Collection Time: 12/02/21  1:22 AM  Result Value Ref Range   Alcohol, Ethyl (B) <10 <10 mg/dL    Comment: (NOTE) Lowest detectable limit for serum alcohol is 10 mg/dL.  For medical purposes only. Performed at [name], [address] [place], [place] [address]   Salicylate level     Status: Abnormal   Collection Time: 12/02/21  1:22 AM  Result Value Ref Range   Salicylate Lvl <9.0 (L) 7.0 - 30.0 mg/dL    Comment: Performed at [facility], [address] [place], [place] [address]  Acetaminophen level     Status: Abnormal   Collection Time: 12/02/21  1:22 AM  Result Value Ref Range   Acetaminophen (Tylenol), Serum <10 (L) 10 - 30 ug/mL    Comment: (NOTE) Therapeutic concentrations vary significantly. A range of 10-30 ug/mL  may be an effective concentration for many patients. However, some  are best treated at concentrations outside of this range. Acetaminophen concentrations >150 ug/mL at 4 hours after ingestion  and >50 ug/mL at 12 hours after ingestion are often associated with  toxic reactions.  Performed at [facility], [address] [place], [place] [address]   Brain natriuretic peptide     Status: Abnormal   Collection Time: 12/02/21  1:22 AM  Result Value Ref Range   B Natriuretic Peptide 214.8 (H) 0.0 - 100.0 pg/mL    Comment: Performed at [facility], [address] [place], [place] [address]  Blood gas, venous     Status: Abnormal   Collection Time: 12/02/21  7:42 AM  Result Value Ref Range   pH, Ven 7.37 7.25 - 7.43   pCO2, Ven 47 44 - 60 mmHg   pO2, Ven 72  (H) 32 - 45 mmHg   Bicarbonate 27.2 20.0 - 28.0 mmol/L   Acid-Base Excess 1.3 0.0 - 2.0 mmol/L   O2 Saturation 94.6 %   Patient temperature 37.0    Collection site VEIN     Comment: Performed at [facility], [address] [place], [place] [address]  Urinalysis, Complete w Microscopic     Status: Abnormal   Collection Time: 12/02/21  7:46 AM  Result Value Ref Range   Color, Urine STRAW (A) YELLOW   APPearance CLEAR (A) CLEAR   Specific Gravity, Urine 1.005 1.005 - 1.030   pH 7.0 5.0 - 8.0   Glucose, UA NEGATIVE NEGATIVE mg/dL   Hgb urine dipstick NEGATIVE NEGATIVE   Bilirubin Urine NEGATIVE NEGATIVE   Ketones, ur NEGATIVE NEGATIVE mg/dL   Protein, ur NEGATIVE NEGATIVE mg/dL   Nitrite NEGATIVE NEGATIVE   Leukocytes,Ua NEGATIVE NEGATIVE   RBC / HPF 0-5 0 - 5 RBC/hpf   WBC, UA 0-5 0 - 5 WBC/hpf   Bacteria, UA NONE SEEN NONE SEEN   Squamous Epithelial / LPF 0-5 0 - 5  Mucus PRESENT     Comment: Performed at [facility], [address] [place], [place] [address]  Resp Panel by RT-PCR (Flu A&B, Covid) Nasopharyngeal Swab     Status: None   Collection Time: 12/02/21  7:58 AM   Specimen: Nasopharyngeal Swab; Nasopharyngeal(NP) swabs in vial transport medium  Result Value Ref Range   SARS Coronavirus 2 by RT PCR NEGATIVE NEGATIVE    Comment: (NOTE) SARS-CoV-2 target nucleic acids are NOT DETECTED.  The SARS-CoV-2 RNA is generally detectable in upper respiratory specimens during the acute phase of infection. The lowest concentration of SARS-CoV-2 viral copies this assay can detect is 138 copies/mL. A negative result does not preclude SARS-Cov-2 infection and should not be used as the sole basis for treatment or other patient management decisions. A negative result may occur with  improper specimen collection/handling, submission of specimen other than nasopharyngeal swab, presence of viral mutation(s) within the areas targeted by this assay, and inadequate  number of viral copies(<138 copies/mL). A negative result must be combined with clinical observations, patient history, and epidemiological information. The expected result is Negative.  Fact Sheet for Patients:  EntrepreneurPulse.com.au  Fact Sheet for Healthcare Providers:  IncredibleEmployment.be  This test is no t yet approved or cleared by the [place] FDA and  has been authorized for detection and/or diagnosis of SARS-CoV-2 by FDA under an Emergency Use Authorization (EUA). This EUA will remain  in effect (meaning this test can be used) for the duration of the COVID-19 declaration under Section 564(b)(1) of the Act, 21 U.S.C.section 360bbb-3(b)(1), unless the authorization is terminated  or revoked sooner.       Influenza A by PCR NEGATIVE NEGATIVE   Influenza B by PCR NEGATIVE NEGATIVE    Comment: (NOTE) The Xpert Xpress SARS-CoV-2/FLU/RSV plus assay is intended as an aid in the diagnosis of influenza from Nasopharyngeal swab specimens and should not be used as a sole basis for treatment. Nasal washings and aspirates are unacceptable for Xpert Xpress SARS-CoV-2/FLU/RSV testing.  Fact Sheet for Patients: EntrepreneurPulse.com.au  Fact Sheet for Healthcare Providers: IncredibleEmployment.be  This test is not yet approved or cleared by the [place] FDA and has been authorized for detection and/or diagnosis of SARS-CoV-2 by FDA under an Emergency Use Authorization (EUA). This EUA will remain in effect (meaning this test can be used) for the duration of the COVID-19 declaration under Section 564(b)(1) of the Act, 21 U.S.C. section 360bbb-3(b)(1), unless the authorization is terminated or revoked.  Performed at [facility], [address] [place], [place] [address]   Urine Drug Screen, Qualitative [facility] only)     Status: Abnormal   Collection Time: 12/02/21  7:58 AM  Result  Value Ref Range   Tricyclic, Ur Screen NONE DETECTED NONE DETECTED   Amphetamines, Ur Screen NONE DETECTED NONE DETECTED   MDMA (Ecstasy)Ur Screen NONE DETECTED NONE DETECTED   Cocaine Metabolite,Ur [place] NONE DETECTED NONE DETECTED   Opiate, Ur Screen NONE DETECTED NONE DETECTED   Phencyclidine (PCP) Ur S NONE DETECTED NONE DETECTED   Cannabinoid 50 Ng, Ur [place] POSITIVE (A) NONE DETECTED   Barbiturates, Ur Screen NONE DETECTED NONE DETECTED   Benzodiazepine, Ur Scrn POSITIVE (A) NONE DETECTED   Methadone Scn, Ur NONE DETECTED NONE DETECTED    Comment: (NOTE) Tricyclics + metabolites, urine    Cutoff 1000 ng/mL Amphetamines + metabolites, urine  Cutoff 1000 ng/mL MDMA (Ecstasy), urine              Cutoff  500 ng/mL Cocaine Metabolite, urine          Cutoff 300 ng/mL Opiate + metabolites, urine        Cutoff 300 ng/mL Phencyclidine (PCP), urine         Cutoff 25 ng/mL Cannabinoid, urine                 Cutoff 50 ng/mL Barbiturates + metabolites, urine  Cutoff 200 ng/mL Benzodiazepine, urine              Cutoff 200 ng/mL Methadone, urine                   Cutoff 300 ng/mL  The urine drug screen provides only a preliminary, unconfirmed analytical test result and should not be used for non-medical purposes. Clinical consideration and professional judgment should be applied to any positive drug screen result due to possible interfering substances. A more specific alternate chemical method must be used in order to obtain a confirmed analytical result. Gas chromatography / mass spectrometry (GC/MS) is the preferred confirm atory method. Performed at [facility], [address] [place], [place] [address]   CBG monitoring, ED     Status: None   Collection Time: 12/02/21 12:40 PM  Result Value Ref Range   Glucose-Capillary 95 70 - 99 mg/dL    Comment: Glucose reference range applies only to samples taken after fasting for at least 8 hours.  Glucose, capillary     Status: Abnormal    Collection Time: 12/03/21  8:06 AM  Result Value Ref Range   Glucose-Capillary 106 (H) 70 - 99 mg/dL    Comment: Glucose reference range applies only to samples taken after fasting for at least 8 hours.  Magnesium     Status: None   Collection Time: 12/03/21 12:52 PM  Result Value Ref Range   Magnesium 2.0 1.7 - 2.4 mg/dL    Comment: Performed at [facility], [address] [place], [place] [address]  Lactic acid, plasma     Status: None   Collection Time: 01/25/22  3:58 PM  Result Value Ref Range   Lactic Acid, Venous 1.5 0.5 - 1.9 mmol/L    Comment: Performed at [facility], [address] [place], [place] [address]  Comprehensive metabolic panel     Status: Abnormal   Collection Time: 01/25/22  3:58 PM  Result Value Ref Range   Sodium 138 135 - 145 mmol/L   Potassium 3.7 3.5 - 5.1 mmol/L   Chloride 102 98 - 111 mmol/L   CO2 28 22 - 32 mmol/L   Glucose, Bld 139 (H) 70 - 99 mg/dL    Comment: Glucose reference range applies only to samples taken after fasting for at least 8 hours.   BUN 17 8 - 23 mg/dL   Creatinine, Ser 0.85 0.44 - 1.00 mg/dL   Calcium 9.4 8.9 - 10.3 mg/dL   Total Protein 6.8 6.5 - 8.1 g/dL   Albumin 3.7 3.5 - 5.0 g/dL   AST 19 15 - 41 U/L   ALT 15 0 - 44 U/L   Alkaline Phosphatase 87 38 - 126 U/L   Total Bilirubin 0.4 0.3 - 1.2 mg/dL   GFR, Estimated >60 >60 mL/min    Comment: (NOTE) Calculated using the CKD-EPI Creatinine Equation (2021)    Anion gap 8 5 - 15    Comment: Performed at [facility], [address], [place], [place] [address]  CBC with Differential  Status: Abnormal   Collection Time: 01/25/22  3:58 PM  Result Value Ref Range   WBC 6.3 4.0 - 10.5 K/uL   RBC 4.01 3.87 - 5.11 MIL/uL   Hemoglobin 11.3 (L) 12.0 - 15.0 g/dL   HCT 35.9 (L) 36.0 - 46.0 %   MCV 89.5 80.0 - 100.0 fL   MCH 28.2 26.0 - 34.0 pg   MCHC 31.5 30.0 - 36.0 g/dL   RDW 14.9 11.5 - 15.5 %   Platelets 268 150 - 400 K/uL   nRBC 0.0  0.0 - 0.2 %   Neutrophils Relative % 68 %   Neutro Abs 4.3 1.7 - 7.7 K/uL   Lymphocytes Relative 19 %   Lymphs Abs 1.2 0.7 - 4.0 K/uL   Monocytes Relative 10 %   Monocytes Absolute 0.6 0.1 - 1.0 K/uL   Eosinophils Relative 2 %   Eosinophils Absolute 0.2 0.0 - 0.5 K/uL   Basophils Relative 1 %   Basophils Absolute 0.0 0.0 - 0.1 K/uL   Immature Granulocytes 0 %   Abs Immature Granulocytes 0.02 0.00 - 0.07 K/uL    Comment: Performed at [facility], [address] [place], [place] [address]  Brain natriuretic peptide     Status: Abnormal   Collection Time: 01/25/22  3:58 PM  Result Value Ref Range   B Natriuretic Peptide 127.6 (H) 0.0 - 100.0 pg/mL    Comment: Performed at [facility], [place], [place] [address]  Troponin I (High Sensitivity)     Status: None   Collection Time: 01/25/22  7:18 PM  Result Value Ref Range   Troponin I (High Sensitivity) 8 <18 ng/L    Comment: (NOTE) Elevated high sensitivity troponin I (hsTnI) values and significant  changes across serial measurements may suggest ACS but many other  chronic and acute conditions are known to elevate hsTnI results.  Refer to the "Links" section for chest pain algorithms and additional  guidance. Performed at [facility], [address] [place], [place] [address]   Resp Panel by RT-PCR (Flu A&B, Covid) Nasopharyngeal Swab     Status: None   Collection Time: 01/25/22 10:06 PM   Specimen: Nasopharyngeal Swab; Nasopharyngeal(NP) swabs in vial transport medium  Result Value Ref Range   SARS Coronavirus 2 by RT PCR NEGATIVE NEGATIVE    Comment: (NOTE) SARS-CoV-2 target nucleic acids are NOT DETECTED.  The SARS-CoV-2 RNA is generally detectable in upper respiratory specimens during the acute phase of infection. The lowest concentration of SARS-CoV-2 viral copies this assay can detect is 138 copies/mL. A negative result does not preclude SARS-Cov-2 infection and should not  be used as the sole basis for treatment or other patient management decisions. A negative result may occur with  improper specimen collection/handling, submission of specimen other than nasopharyngeal swab, presence of viral mutation(s) within the areas targeted by this assay, and inadequate number of viral copies(<138 copies/mL). A negative result must be combined with clinical observations, patient history, and epidemiological information. The expected result is Negative.  Fact Sheet for Patients:  EntrepreneurPulse.com.au  Fact Sheet for Healthcare Providers:  IncredibleEmployment.be  This test is no t yet approved or cleared by the [place] FDA and  has been authorized for detection and/or diagnosis of SARS-CoV-2 by FDA under an Emergency Use Authorization (EUA). This EUA will remain  in effect (meaning this test can be used) for the duration of the COVID-19 declaration under Section 564(b)(1) of the Act, 21 U.S.C.section  360bbb-3(b)(1), unless the authorization is terminated  or revoked sooner.       Influenza A by PCR NEGATIVE NEGATIVE   Influenza B by PCR NEGATIVE NEGATIVE    Comment: (NOTE) The Xpert Xpress SARS-CoV-2/FLU/RSV plus assay is intended as an aid in the diagnosis of influenza from Nasopharyngeal swab specimens and should not be used as a sole basis for treatment. Nasal washings and aspirates are unacceptable for Xpert Xpress SARS-CoV-2/FLU/RSV testing.  Fact Sheet for Patients: EntrepreneurPulse.com.au  Fact Sheet for Healthcare Providers: IncredibleEmployment.be  This test is not yet approved or cleared by the [place] FDA and has been authorized for detection and/or diagnosis of SARS-CoV-2 by FDA under an Emergency Use Authorization (EUA). This EUA will remain in effect (meaning this test can be used) for the duration of the COVID-19 declaration under Section 564(b)(1) of  the Act, 21 U.S.C. section 360bbb-3(b)(1), unless the authorization is terminated or revoked.  Performed at [facility], [address] [place], [place] [address]   Basic metabolic panel     Status: Abnormal   Collection Time: 01/26/22  3:55 AM  Result Value Ref Range   Sodium 139 135 - 145 mmol/L   Potassium 4.1 3.5 - 5.1 mmol/L   Chloride 105 98 - 111 mmol/L   CO2 27 22 - 32 mmol/L   Glucose, Bld 106 (H) 70 - 99 mg/dL    Comment: Glucose reference range applies only to samples taken after fasting for at least 8 hours.   BUN 19 8 - 23 mg/dL   Creatinine, Ser 0.74 0.44 - 1.00 mg/dL   Calcium 9.1 8.9 - 10.3 mg/dL   GFR, Estimated >60 >60 mL/min    Comment: (NOTE) Calculated using the CKD-EPI Creatinine Equation (2021)    Anion gap 7 5 - 15    Comment: Performed at [facility] [place], [address] [place], [place] [address]  CBC     Status: Abnormal   Collection Time: 01/26/22  3:55 AM  Result Value Ref Range   WBC 6.5 4.0 - 10.5 K/uL   RBC 4.05 3.87 - 5.11 MIL/uL   Hemoglobin 11.2 (L) 12.0 - 15.0 g/dL   HCT 36.2 36.0 - 46.0 %   MCV 89.4 80.0 - 100.0 fL   MCH 27.7 26.0 - 34.0 pg   MCHC 30.9 30.0 - 36.0 g/dL   RDW 14.9 11.5 - 15.5 %   Platelets 239 150 - 400 K/uL   nRBC 0.0 0.0 - 0.2 %    Comment: Performed at [facility], [place],  [address]  Procalcitonin - Baseline     Status: None   Collection Time: 01/26/22  3:55 AM  Result Value Ref Range   Procalcitonin <0.10 ng/mL    Comment:        Interpretation: PCT (Procalcitonin) <= 0.5 ng/mL: Systemic infection (sepsis) is not likely. Local bacterial infection is possible. (NOTE)       Sepsis PCT Algorithm           Lower Respiratory Tract                                      Infection PCT Algorithm    ----------------------------     ----------------------------         PCT < 0.25 ng/mL                PCT <  0.10 ng/mL          Strongly encourage             Strongly  discourage   discontinuation of antibiotics    initiation of antibiotics    ----------------------------     -----------------------------       PCT 0.25 - 0.50 ng/mL            PCT 0.10 - 0.25 ng/mL               OR       >80% decrease in PCT            Discourage initiation of                                            antibiotics      Encourage discontinuation           of antibiotics    ----------------------------     -----------------------------         PCT >= 0.50 ng/mL              PCT 0.26 - 0.50 ng/mL               AND        <80% decrease in PCT             Encourage initiation of                                             antibiotics       Encourage continuation           of antibiotics    ----------------------------     -----------------------------        PCT >= 0.50 ng/mL                  PCT > 0.50 ng/mL               AND         increase in PCT                  Strongly encourage                                      initiation of antibiotics    Strongly encourage escalation           of antibiotics                                     -----------------------------                                           PCT <= 0.25 ng/mL                                                 OR                                        >  80% decrease in PCT                                      Discontinue / Do not initiate                                             antibiotics  Performed at [facility], [address] [place], [place] [address]   Urinalysis, Routine w reflex microscopic     Status: Abnormal   Collection Time: 01/26/22  8:14 AM  Result Value Ref Range   Color, Urine YELLOW (A) YELLOW   APPearance CLEAR (A) CLEAR   Specific Gravity, Urine 1.011 1.005 - 1.030   pH 6.0 5.0 - 8.0   Glucose, UA NEGATIVE NEGATIVE mg/dL   Hgb urine dipstick NEGATIVE NEGATIVE   Bilirubin Urine NEGATIVE NEGATIVE   Ketones, ur NEGATIVE NEGATIVE mg/dL   Protein, ur NEGATIVE  NEGATIVE mg/dL   Nitrite NEGATIVE NEGATIVE   Leukocytes,Ua NEGATIVE NEGATIVE    Comment: Performed at [facility], [address], [place], [place] [address]    Radiology: No results found.  No results found.  No results found.    Assessment and Plan: Patient Active Problem List   Diagnosis Date Noted   Atrial fibrillation, chronic ([place]) 01/26/2022   Morbid obesity ([place]) 01/26/2022   Chronic respiratory failure with hypoxia ([place]) 01/26/2022   Bilateral pneumonia 01/25/2022   Chronic intractable headache 09/81/1914   Acute metabolic encephalopathy 78/29/5621   Chronic diastolic CHF (congestive heart failure) ([place]) 12/02/2021   HLD (hyperlipidemia) 12/02/2021   Stroke ([place]) 12/02/2021   Iron deficiency anemia 12/02/2021   Depression 12/02/2021   Acute on chronic respiratory failure with hypoxia ([place]) 11/11/2021   Unable to maintain body in lying position 10/16/2021   Orthopnea 10/16/2021   Class 2 obesity with alveolar hypoventilation, serious comorbidity, and body mass index (BMI) of 39.0 to 39.9 in adult ([place]) 10/16/2021   At high risk for postoperative complications 30/86/5784   Hearing loss 08/12/2021   Long term prescription benzodiazepine use 04/30/2021   Chronic shoulder pain (1ry area of Pain) (Bilateral) (L>R) 04/30/2021   Chronic upper extremity pain (2ry area of Pain) (Bilateral) (L>R) 04/30/2021   Cervicalgia 04/30/2021   Chronic neck pain (3ry area of Pain) (Posterior) (Bilateral) (L>R) 04/30/2021   Shoulder blade pain (4th area of Pain) (Left) 04/30/2021   Osteoarthritis of glenohumeral joint (Left) 04/30/2021   Osteoarthritis of AC (acromioclavicular) joint (Left) 04/30/2021   Osteoarthritis of glenohumeral joints (Bilateral) 04/30/2021   Osteoarthritis of acromioclavicular joints (Bilateral) 04/30/2021   Primary osteoarthritis of shoulders (Bilateral) 04/30/2021   DDD (degenerative disc disease), cervical 04/30/2021   Cervical radiculitis (Left)  04/30/2021   Cervical radiculopathy (Left) 04/30/2021   C6 radiculopathy (Left) 04/30/2021   C7 radiculopathy (Left) 04/30/2021   Wheelchair dependence 04/30/2021   Chronic pain syndrome 04/29/2021   Pharmacologic therapy 04/29/2021   Disorder of skeletal system 04/29/2021   Problems influencing health status 04/29/2021   Chronic anticoagulation (Eliquis) 03/14/2021   Hx of atrioventricular node ablation 03/14/2021   Symptomatic anemia 01/29/2021   Acute on chronic diastolic CHF (congestive heart failure) ([place]) 05/11/2020   Chronic obstructive pulmonary disease (COPD) ([place])    Hypothyroidism    Cardiac pacemaker in situ 05/10/2020   Acute  non-recurrent frontal sinusitis 04/22/2020   Vertigo 04/22/2020   S/P placement of cardiac pacemaker 02/21/2020   Atrial fibrillation status post cardioversion [facility]) 11/16/2019   Episode of moderate major depression ([place]) 10/10/2019   Persistent atrial fibrillation ([place])    Encounter for general adult medical examination with abnormal findings 07/10/2019   Encounter for screening mammogram for malignant neoplasm of breast 07/10/2019   Atopic dermatitis 05/02/2019   Paroxysmal atrial flutter ([place]) 08/11/2018   Encounter for long-term (current) use of medications 07/09/2018   Chronic left shoulder pain 07/09/2018   Conjunctivitis 07/09/2018   Oxygen dependent 07/09/2018   GAD (generalized anxiety disorder) 07/09/2018   Ovarian failure 07/09/2018   Positive colorectal cancer screening using Cologuard test 04/24/2018   Calculus of gallbladder with acute on chronic cholecystitis 04/08/2018   H/O: CVA (cerebrovascular accident) 04/08/2018   Dysuria 03/02/2018   Right upper quadrant abdominal tenderness without rebound tenderness 03/02/2018   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 exacerbation ([place]) 03/02/2018   Chronic obstructive pulmonary disease ([place]) 09/19/2017   Typical atrial flutter ([name])    Irritable bowel syndrome without diarrhea  01/29/2015   Essential hypertension 01/24/2015   Paroxysmal atrial fibrillation ([place]) 01/24/2015   History of aortic valve replacement with bioprosthetic valve 01/24/2015   Atrial fibrillation with RVR ([place]) 01/11/2015   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 05/18/2014   Shoulder impingement syndrome (Left) 05/18/2014   Cellulitis and abscess 02/13/2014   MRSA (methicillin resistant Staphylococcus aureus) 02/13/2014   Aneurysm, ascending aorta ([place]) 06/23/2013   Bicuspid aortic valve 06/23/2013    1. SOB (shortness of breath) Multifactorial including pulmonary disease obesity and cardiac disease.  She is now back to her baseline stable. - Spirometry with Graph  2. Pneumonia of both lower lobes due to infectious organism She was diagnosed with pneumonia and had some groundglass opacities noted on the last chest films.  She is now actually clinically improved in addition to that there were some improvements of the groundglass opacities on the last films that were done   3. OSA (obstructive sleep apnea) Patient has been on PAP therapy needs to continue with the PAP as ordered.  I think this helps her with her breathing also.  4. Chronic respiratory failure with hypoxia ([name]) On oxygen therapy supportive care  5. COPD mixed type ([place]) Inhalers as warranted.  6. ILD Continues to have infiltrates on CXR despite treatment would suggest a HRCT of the chest follow up to determine ILD severeity and type the patient still had groundglass opacities on the last scan that was done.  This is needed to be followed up with a repeat CT of the chest we will get this ordered.   General Counseling: I have discussed the findings of the evaluation and examination with Courtlyn.  I have also discussed any further diagnostic evaluation thatmay be needed or ordered today. Shanieka verbalizes understanding of the findings of todays visit. We also reviewed her medications today and discussed  drug interactions and side effects including but not limited excessive drowsiness and altered mental states. We also discussed that there is always a risk not just to her but also people around her. she has been encouraged to call the office with any questions or concerns that should arise related to todays visit.  Orders Placed This Encounter  Procedures   CT Chest High Resolution    Standing Status:   Future    Standing Expiration Date:   03/03/2023    Order Specific Question:  Preferred imaging location?    Answer:   [facility]   Spirometry with Graph    Order Specific Question:   Where should this test be performed?    Answer:   [facility]    Order Specific Question:   Basic spirometry    Answer:   Yes     Time spent: 19  I have personally obtained a history, examined the patient, evaluated laboratory and imaging results, formulated the assessment and plan and placed orders.    Allyne Gee, MD [facility] Pulmonary and Critical Care Sleep medicine

## 2022-02-20 ENCOUNTER — Telehealth: Payer: Self-pay

## 2022-02-20 NOTE — Telephone Encounter (Signed)
Chest CT denied since chest x-ray not performed. Sent message to DSK-Toni

## 2022-02-23 ENCOUNTER — Telehealth: Payer: Self-pay

## 2022-02-23 ENCOUNTER — Other Ambulatory Visit: Payer: Self-pay | Admitting: Internal Medicine

## 2022-02-23 ENCOUNTER — Other Ambulatory Visit: Payer: Self-pay

## 2022-02-23 DIAGNOSIS — G8929 Other chronic pain: Secondary | ICD-10-CM

## 2022-02-23 DIAGNOSIS — M5412 Radiculopathy, cervical region: Secondary | ICD-10-CM

## 2022-02-23 MED ORDER — OXYCODONE-ACETAMINOPHEN 7.5-325 MG PO TABS: ORAL_TABLET | ORAL | 0 refills | Status: DC

## 2022-02-23 NOTE — Telephone Encounter (Signed)
Blanch Media, I put the order in for CT

## 2022-02-23 NOTE — Progress Notes (Unsigned)
CT

## 2022-02-23 NOTE — Telephone Encounter (Signed)
Please advise pt that since she is on Tramadol, should not take Oxycodone regularly, next month She will only get # 30, will need UDS

## 2022-02-23 NOTE — Telephone Encounter (Signed)
She is calling to inquire about the message from last week regarding the CT. I am still waiting on an order if he still wants her to have it.

## 2022-02-23 NOTE — Telephone Encounter (Signed)
Pt.notified

## 2022-03-02 DIAGNOSIS — G4733 Obstructive sleep apnea (adult) (pediatric): Secondary | ICD-10-CM | POA: Diagnosis not present

## 2022-03-04 ENCOUNTER — Ambulatory Visit
Admission: RE | Admit: 2022-03-04 | Discharge: 2022-03-04 | Disposition: A | Discharge status: Home / Self Care | Payer: Medicare Other | Source: Ambulatory Visit | Attending: Pain Medicine | Admitting: Pain Medicine

## 2022-03-04 DIAGNOSIS — M79601 Pain in right arm: Secondary | ICD-10-CM | POA: Insufficient documentation

## 2022-03-04 DIAGNOSIS — M47812 Spondylosis without myelopathy or radiculopathy, cervical region: Secondary | ICD-10-CM | POA: Diagnosis not present

## 2022-03-04 DIAGNOSIS — M79602 Pain in left arm: Secondary | ICD-10-CM | POA: Insufficient documentation

## 2022-03-04 DIAGNOSIS — M5412 Radiculopathy, cervical region: Secondary | ICD-10-CM

## 2022-03-04 DIAGNOSIS — M4802 Spinal stenosis, cervical region: Secondary | ICD-10-CM | POA: Diagnosis not present

## 2022-03-04 DIAGNOSIS — M25512 Pain in left shoulder: Secondary | ICD-10-CM | POA: Diagnosis not present

## 2022-03-04 DIAGNOSIS — M542 Cervicalgia: Secondary | ICD-10-CM | POA: Insufficient documentation

## 2022-03-04 DIAGNOSIS — M2578 Osteophyte, vertebrae: Secondary | ICD-10-CM | POA: Diagnosis not present

## 2022-03-04 DIAGNOSIS — G8929 Other chronic pain: Secondary | ICD-10-CM

## 2022-03-04 DIAGNOSIS — M25511 Pain in right shoulder: Secondary | ICD-10-CM | POA: Diagnosis not present

## 2022-03-04 NOTE — Telephone Encounter (Signed)
Attempted to obtain authorization with updated diagnosis. Per FE C. with home health plan, authorization still denied. She stated patient can wait 18 months from previous CT (01/01/22) for follow up CT per results and notes-Toni

## 2022-03-09 ENCOUNTER — Ambulatory Visit: Payer: Medicare Other | Admitting: Nurse Practitioner

## 2022-03-12 ENCOUNTER — Encounter: Payer: Self-pay | Admitting: Pain Medicine

## 2022-03-12 DIAGNOSIS — M4810 Ankylosing hyperostosis [Forestier], site unspecified: Secondary | ICD-10-CM | POA: Insufficient documentation

## 2022-03-16 ENCOUNTER — Telehealth: Payer: Self-pay | Admitting: Pain Medicine

## 2022-03-16 ENCOUNTER — Telehealth: Payer: Self-pay

## 2022-03-16 DIAGNOSIS — Z950 Presence of cardiac pacemaker: Secondary | ICD-10-CM | POA: Diagnosis not present

## 2022-03-16 NOTE — Telephone Encounter (Signed)
Patient has appt 03-26-22 at 11:20 for CESI, she is on some kind of blood thinner and will need instructions. Patient also wants to get results of her CT scan

## 2022-03-16 NOTE — Telephone Encounter (Signed)
Received call from [facility]. Patient requested appeal to cover and authorize chest CT. Per Vestie's request, office notes and imaging reports faxed to her attention @ [phone]

## 2022-03-16 NOTE — Telephone Encounter (Signed)
Patient called with questions about stopping blood thinner.  Instructed to stop Eliquis for 3 complete days and to bring a driver.

## 2022-03-17 ENCOUNTER — Encounter: Payer: Self-pay | Admitting: Internal Medicine

## 2022-03-17 ENCOUNTER — Ambulatory Visit: Payer: Medicare Other | Admitting: Internal Medicine

## 2022-03-17 VITALS — BP 106/58 | HR 75 | Temp 98.6°F | Resp 16 | Ht <= 58 in | Wt 188.2 lb

## 2022-03-17 DIAGNOSIS — I5032 Chronic diastolic (congestive) heart failure: Secondary | ICD-10-CM

## 2022-03-17 DIAGNOSIS — G8929 Other chronic pain: Secondary | ICD-10-CM

## 2022-03-17 DIAGNOSIS — M25511 Pain in right shoulder: Secondary | ICD-10-CM

## 2022-03-17 DIAGNOSIS — Z79899 Other long term (current) drug therapy: Secondary | ICD-10-CM | POA: Diagnosis not present

## 2022-03-17 DIAGNOSIS — I700 Atherosclerosis of aorta: Secondary | ICD-10-CM

## 2022-03-17 DIAGNOSIS — J9611 Chronic respiratory failure with hypoxia: Secondary | ICD-10-CM | POA: Diagnosis not present

## 2022-03-17 DIAGNOSIS — I480 Paroxysmal atrial fibrillation: Secondary | ICD-10-CM

## 2022-03-17 LAB — POCT URINE DRUG SCREEN
Methylenedioxyamphetamine: NOT DETECTED
POC Amphetamine UR: NOT DETECTED
POC BENZODIAZEPINES UR: NOT DETECTED
POC Barbiturate UR: NOT DETECTED
POC Cocaine UR: NOT DETECTED
POC Ecstasy UR: NOT DETECTED
POC Marijuana UR: NOT DETECTED
POC Methadone UR: NOT DETECTED
POC Methamphetamine UR: NOT DETECTED
POC Opiate Ur: NOT DETECTED
POC Oxycodone UR: POSITIVE — AB
POC PHENCYCLIDINE UR: NOT DETECTED
POC TRICYCLICS UR: NOT DETECTED

## 2022-03-17 MED ORDER — PNEUMOCOCCAL 20-VAL CONJ VACC 0.5 ML IM SUSY: 0.5000 mL | PREFILLED_SYRINGE | INTRAMUSCULAR | 0 refills | Status: AC

## 2022-03-17 MED ORDER — ZOSTER VAC RECOMB ADJUVANTED 50 MCG/0.5ML IM SUSR: 0.5000 mL | Freq: Once | INTRAMUSCULAR | 0 refills | Status: AC

## 2022-03-17 MED ORDER — OXYCODONE-ACETAMINOPHEN 7.5-325 MG PO TABS: ORAL_TABLET | ORAL | 0 refills | Status: DC

## 2022-03-17 NOTE — Progress Notes (Signed)
[facility] [place], [place] [address]  Internal MEDICINE  Office Visit Note  Patient Name: [name]  [id number]  [id number]  Date of Service: 03/18/2022  Chief Complaint  Patient presents with   Follow-up    Thirsty all the time, at night feet burn / ache, right hand pain on the back of hand, fingers swollen in the mornings    Hyperlipidemia   Hypertension   COPD    HPI  Pt is here for routine follow up 1.C/o bilateral shoulder pain and right hand pain since an IV was placed in her right wrist, she is going to see orthopedics about it, pain control is not sufficient with a combination of Ultram and hydrocodone and Ultram does not work for her, she is unable to sleep at night due to pain.  2.  Patient is also complaining of dry mouth she has been taking excessive/increased dose of diuretics for her chronic diastolic heart failure blood pressure is slightly on the low side.  She has history of aortic valve replacement  3.  Patient is on oxygen for chronic lung disease 4.  Patient did have abnormal high-resolution CT chest will need another follow-up in September Current Medication: Outpatient Encounter Medications as of 03/17/2022  Medication Sig   albuterol (VENTOLIN HFA) 108 (90 Base) MCG/ACT inhaler Inhale 2 puffs into the lungs every 6 (six) hours as needed for wheezing or shortness of breath.   ALPRAZolam (XANAX) 0.25 MG tablet Take 1 tablet (0.25 mg total) by mouth 2 (two) times daily as needed for anxiety.   amLODipine (NORVASC) 10 MG tablet Take 1 tablet by mouth daily.   apixaban (ELIQUIS) 5 MG TABS tablet Take 1 tablet (5 mg total) by mouth 2 (two) times daily.   atorvastatin (LIPITOR) 40 MG tablet 1 tab po qhs   Budeson-Glycopyrrol-Formoterol (BREZTRI AEROSPHERE) 160-9-4.8 MCG/ACT AERO Inhale 2 puffs into the lungs 2 (two) times daily.   bumetanide (BUMEX) 1 MG tablet TAKE 2 TABLETS BY MOUTH TWICE DAILY   Calcium Carbonate-Vitamin D (CALCIUM  600+D PO) Take 1 tablet by mouth daily.   carvedilol (COREG) 6.25 MG tablet Take 1 tablet (6.25 mg total) by mouth 2 (two) times daily with a meal.   Coenzyme Q10 (COQ10) 200 MG CAPS Take 200 mg by mouth daily.   diclofenac Sodium (VOLTAREN) 1 % GEL SMARTSIG:Gram(s) Topical Twice Daily   ferrous sulfate 325 (65 FE) MG tablet Take 1 tablet (325 mg total) by mouth daily with breakfast.   flecainide (TAMBOCOR) 50 MG tablet Take 50 mg by mouth daily.   ipratropium-albuterol (DUONEB) 0.5-2.5 (3) MG/3ML SOLN Inhale 3 mLs into the lungs every 4 (four) hours as needed. Inhale 3 mls into the lungs every 4 to 6 hours and as needed   levothyroxine (SYNTHROID) 25 MCG tablet Take 1 tablet (25 mcg total) by mouth daily before breakfast.   lisinopril (ZESTRIL) 40 MG tablet TAKE 1 TABLET(40 MG) BY MOUTH DAILY(DOSE INCREASE)   loratadine (CLARITIN) 10 MG tablet Take 10 mg by mouth daily.   pantoprazole (PROTONIX) 40 MG tablet Take 1 tablet (40 mg total) by mouth daily.   potassium chloride (KLOR-CON) 10 MEQ tablet Take 4 tablets (40 mEq total) by mouth 2 (two) times daily.   sertraline (ZOLOFT) 100 MG tablet Take one tab a day for Anxiety   sodium chloride (OCEAN) 0.65 % SOLN nasal spray Place 1 spray into both nostrils as needed for congestion.   traZODone (DESYREL) 50 MG  tablet Take 1 tablet (50 mg total) by mouth at bedtime.   [DISCONTINUED] oxyCODONE-acetaminophen (PERCOCET) 7.5-325 MG tablet Take one tab in am and half in pm for severe shoulder pain   [DISCONTINUED] pneumococcal 20-valent conjugate vaccine (PREVNAR 20) 0.5 ML injection Inject 0.5 mLs into the muscle tomorrow at 10 am.   [DISCONTINUED] traMADol (ULTRAM) 50 MG tablet Take one tab po bid for pain   [DISCONTINUED] Zoster Vaccine Adjuvanted [facility] injection Inject 0.5 mLs into the muscle once.   oxyCODONE-acetaminophen (PERCOCET) 7.5-325 MG tablet Take one tab po bid for pain severe   [EXPIRED] pneumococcal 20-valent conjugate vaccine  (PREVNAR 20) 0.5 ML injection Inject 0.5 mLs into the muscle tomorrow at 10 am for 1 dose.   [EXPIRED] Zoster Vaccine Adjuvanted [facility]) injection Inject 0.5 mLs into the muscle once for 1 dose.   No facility-administered encounter medications on file as of 03/17/2022.    Surgical History: Past Surgical History:  Procedure Laterality Date   ABDOMINAL AORTIC ANEURYSM REPAIR  2008   ABDOMINAL HYSTERECTOMY     AORTIC VALVE REPLACEMENT  2008   CARDIAC CATHETERIZATION     [place]   CARDIOVERSION N/A 08/15/2018   Procedure: CARDIOVERSION;  Surgeon: [name], MD;  Location: [facility] ORS;  Service: Cardiovascular;  Laterality: N/A;   CARDIOVERSION N/A 11/14/2018   Procedure: CARDIOVERSION (CATH LAB);  Surgeon: [name], MD;  Location: [facility] ORS;  Service: Cardiovascular;  Laterality: N/A;   CARDIOVERSION N/A 06/05/2019   Procedure: CARDIOVERSION;  Surgeon: [name], MD;  Location: [facility] ORS;  Service: Cardiovascular;  Laterality: N/A;   CARDIOVERSION N/A 08/14/2019   Procedure: CARDIOVERSION;  Surgeon: [name], MD;  Location: [facility];  Service: Cardiovascular;  Laterality: N/A;   CARDIOVERSION N/A 11/09/2019   Procedure: CARDIOVERSION;  Surgeon: Isaias Cowman, MD;  Location: [facility] ORS;  Service: Cardiovascular;  Laterality: N/A;   CARDIOVERSION N/A 01/17/2020   Procedure: CARDIOVERSION;  Surgeon: Isaias Cowman, MD;  Location: [facility] ORS;  Service: Cardiovascular;  Laterality: N/A;   CARPAL TUNNEL RELEASE     ELECTROPHYSIOLOGIC STUDY N/A 08/17/2016   Procedure: Cardioversion;  Surgeon: [name], MD;  Location: [facility] ORS;  Service: Cardiovascular;  Laterality: N/A;   TUMOR EXCISION Left    x3 (arm)    Medical History: Past Medical History:  Diagnosis Date   Aortic 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 s/p bioprosthetic valve replacement 2008 at [facility];  b. 01/2015 Echo: EF 60-65%, no rwma, Gr1 DD, mildly dil LA, nl RV fxn.   Aspiration pneumonia  ([place])    Atrial fibrillation with RVR ([place]) 01/11/2015   Atrial flutter ([place])    a. 08/2016 s/p DCCV.  Remains on flecainide 50 mg bid.   Basal skull fracture ([name]) 20 yrs ago   CHF (congestive heart failure) ([name])    Chronic respiratory failure ([name])    COPD (chronic obstructive pulmonary disease) ([place])    a. 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 2008 prior to Aortic aneurysm repair-->nl cors.   History of stress test    a. 10/2015 MV: no ischemia/infarct.   HLD (hyperlipidemia)    HTN (hypertension)    Hypothyroidism    Obesity    PAF (paroxysmal atrial fibrillation) ([name])    a. on Eliquis; b. CHADS2VASc = 3 (  HTN, age x 1, female).   Paroxysmal atrial fibrillation ([name]) 01/24/2015   Right upper quadrant abdominal tenderness without rebound tenderness 03/02/2018   S/P ascending aortic aneurysm repair 2008    Family History: Family History  Problem Relation Age of Onset   Stroke Father    Stroke Paternal Grandmother     Social History   Socioeconomic History   Marital status: Single    Spouse name: Not on file   Number of children: Not on file   Years of education: Not on file   Highest education level: Not on file  Occupational History   Not on file  Tobacco Use   Smoking status: Former    Types: Cigarettes   Smokeless tobacco: Never  Vaping Use   Vaping Use: Never used  Substance and Sexual Activity   Alcohol use: No   Drug use: No   Sexual activity: Never    Birth control/protection: Surgical  Other Topics Concern   Not on file  Social History Narrative   Not on file   Social Determinants of Health   Financial Resource Strain: Low Risk  (08/04/2021)   Overall Financial Resource Strain (CARDIA)    Difficulty of Paying Living Expenses: Not very hard  Food Insecurity: Not on file  Transportation Needs: Not on file  Physical Activity:  Not on file  Stress: Not on file  Social Connections: Not on file  Intimate Partner Violence: Not on file      Review of Systems  Constitutional:  Negative for chills, fatigue and unexpected weight change.  HENT:  Negative for congestion, ear pain, postnasal drip, rhinorrhea, sneezing and sore throat.        Dry mouth  Eyes:  Negative for redness.  Respiratory:  Negative for cough, chest tightness and shortness of breath.   Cardiovascular:  Negative for chest pain and palpitations.  Gastrointestinal:  Negative for abdominal pain, constipation, diarrhea, nausea and vomiting.  Genitourinary:  Negative for dysuria and frequency.  Musculoskeletal:  Negative for arthralgias, back pain, joint swelling and neck pain.  Skin:  Negative for rash.  Neurological: Negative.  Negative for tremors and numbness.  Hematological:  Negative for adenopathy. Does not bruise/bleed easily.  Psychiatric/Behavioral:  Negative for behavioral problems (Depression), sleep disturbance and suicidal ideas. The patient is not nervous/anxious.     Vital Signs: BP (!) 106/58   Pulse 75   Temp 98.6 F (37 C)   Resp 16   Ht 4\' 10"  (1.473 m)   Wt 188 lb 3.2 oz (85.4 kg)   SpO2 91%   BMI 39.33 kg/m    Physical Exam Constitutional:      Appearance: Normal appearance.  HENT:     Head: Normocephalic and atraumatic.     Nose: Nose normal.     Mouth/Throat:     Mouth: Mucous membranes are moist.     Pharynx: No posterior oropharyngeal erythema.  Eyes:     Extraocular Movements: Extraocular movements intact.     Pupils: Pupils are equal, round, and reactive to light.  Cardiovascular:     Pulses: Normal pulses.     Heart sounds: Normal heart sounds.  Pulmonary:     Effort: Pulmonary effort is normal.     Breath sounds: Normal breath sounds.  Neurological:     General: No focal deficit present.     Mental Status: She is alert.  Psychiatric:        Mood and Affect: Mood normal.  Behavior: Behavior  normal.        Assessment/Plan: 1. Chronic right shoulder pain Stop tramadol since is not working for her increase oxycodone 1 tablet twice a day.  PDMP is reviewed - oxyCODONE-acetaminophen (PERCOCET) 7.5-325 MG tablet; Take one tab po bid for pain severe  Dispense: 60 tablet; Refill: 0  2. Encounter for long-term (current) drug use Urine drug screen. Appropriately positive for oxycodone - POCT Urine Drug Screen  3. Chronic respiratory failure with hypoxia ([name]) Continue to use her oxygen, continue to use her inhalers as before patient is doing well with breath history - CBC with Differential/Platelet  4. Chronic diastolic heart failure ([place]) This is followed by cardiology patient also had history of A-fib, aortic valve replacement - Comprehensive metabolic panel  5. Aortic atherosclerosis ([name]) Continue Lipitor as before  6. PAF (paroxysmal atrial fibrillation) ([place]) Followed by cardiology patient is on flecainide and Eliquis   General Counseling: Loran verbalizes understanding of the findings of todays visit and agrees with plan of treatment. I have discussed any further diagnostic evaluation that may be needed or ordered today. We also reviewed her medications today. she has been encouraged to call the office with any questions or concerns that should arise related to todays visit.    Orders Placed This Encounter  Procedures   POCT Urine Drug Screen    Meds ordered this encounter  Medications   pneumococcal 20-valent conjugate vaccine (PREVNAR 20) 0.5 ML injection    Sig: Inject 0.5 mLs into the muscle tomorrow at 10 am for 1 dose.    Dispense:  0.5 mL    Refill:  0   oxyCODONE-acetaminophen (PERCOCET) 7.5-325 MG tablet    Sig: Take one tab po bid for pain severe    Dispense:  60 tablet    Refill:  0   Zoster Vaccine Adjuvanted [facility]) injection    Sig: Inject 0.5 mLs into the muscle once for 1 dose.    Dispense:  0.5 mL    Refill:  0    Total time  spent:35 Minutes Time spent includes review of chart, medications, test results, and follow up plan with the patient.   [place] Controlled Substance Database was reviewed by me.   Dr [name] Internal medicine

## 2022-03-18 ENCOUNTER — Telehealth: Payer: Self-pay

## 2022-03-18 ENCOUNTER — Telehealth: Payer: Medicare Other

## 2022-03-18 NOTE — Telephone Encounter (Signed)
Spoke to pt, informed her to get labs done at any [facility]

## 2022-03-18 NOTE — Telephone Encounter (Signed)
LMOM to ask the pt to go for labs to check her kidneys since she was c/o of dry mouth and is taking fluid pills per DFK.

## 2022-03-20 DIAGNOSIS — J9611 Chronic respiratory failure with hypoxia: Secondary | ICD-10-CM | POA: Diagnosis not present

## 2022-03-20 DIAGNOSIS — Z79899 Other long term (current) drug therapy: Secondary | ICD-10-CM | POA: Diagnosis not present

## 2022-03-20 DIAGNOSIS — G8929 Other chronic pain: Secondary | ICD-10-CM | POA: Diagnosis not present

## 2022-03-20 DIAGNOSIS — M25511 Pain in right shoulder: Secondary | ICD-10-CM | POA: Diagnosis not present

## 2022-03-21 ENCOUNTER — Other Ambulatory Visit: Payer: Self-pay | Admitting: Internal Medicine

## 2022-03-21 LAB — CBC WITH DIFFERENTIAL/PLATELET
Basophils Absolute: 0 10*3/uL (ref 0.0–0.2)
Basos: 1 %
EOS (ABSOLUTE): 0.1 10*3/uL (ref 0.0–0.4)
Eos: 2 %
Hematocrit: 34.1 % (ref 34.0–46.6)
Hemoglobin: 11.4 g/dL (ref 11.1–15.9)
Immature Grans (Abs): 0 10*3/uL (ref 0.0–0.1)
Immature Granulocytes: 0 %
Lymphocytes Absolute: 1.1 10*3/uL (ref 0.7–3.1)
Lymphs: 18 %
MCH: 28.9 pg (ref 26.6–33.0)
MCHC: 33.4 g/dL (ref 31.5–35.7)
MCV: 86 fL (ref 79–97)
Monocytes Absolute: 0.8 10*3/uL (ref 0.1–0.9)
Monocytes: 14 %
Neutrophils Absolute: 4.1 10*3/uL (ref 1.4–7.0)
Neutrophils: 65 %
Platelets: 274 10*3/uL (ref 150–450)
RBC: 3.95 x10E6/uL (ref 3.77–5.28)
RDW: 15.2 % (ref 11.7–15.4)
WBC: 6.2 10*3/uL (ref 3.4–10.8)

## 2022-03-21 LAB — COMPREHENSIVE METABOLIC PANEL
ALT: 13 IU/L (ref 0–32)
AST: 21 IU/L (ref 0–40)
Albumin/Globulin Ratio: 1.5 (ref 1.2–2.2)
Albumin: 4.2 g/dL (ref 3.8–4.8)
Alkaline Phosphatase: 113 IU/L (ref 44–121)
BUN/Creatinine Ratio: 21 (ref 12–28)
BUN: 19 mg/dL (ref 8–27)
Bilirubin Total: 0.3 mg/dL (ref 0.0–1.2)
CO2: 23 mmol/L (ref 20–29)
Calcium: 9.6 mg/dL (ref 8.7–10.3)
Chloride: 102 mmol/L (ref 96–106)
Creatinine, Ser: 0.89 mg/dL (ref 0.57–1.00)
Globulin, Total: 2.8 g/dL (ref 1.5–4.5)
Glucose: 104 mg/dL — ABNORMAL HIGH (ref 70–99)
Potassium: 4.5 mmol/L (ref 3.5–5.2)
Sodium: 140 mmol/L (ref 134–144)
Total Protein: 7 g/dL (ref 6.0–8.5)
eGFR: 68 mL/min/{1.73_m2} (ref >59)

## 2022-03-24 ENCOUNTER — Telehealth: Payer: Self-pay | Admitting: Pain Medicine

## 2022-03-24 NOTE — Telephone Encounter (Signed)
Patient states she has no idea what is involved with a Cervical Epidural and she has not talked about it with the MD.  Dr Dossie Arbour notified.  Procedure cancelled for Thursday.  Message sent to front desk staff to schedule the patient an appointment for consultation about CESI.

## 2022-03-24 NOTE — Telephone Encounter (Signed)
Patient will like to get some more information about her procedure that she is scheduled for on 03-26-22. Please give patient a call. Thanks

## 2022-03-26 ENCOUNTER — Ambulatory Visit: Payer: Medicare Other | Admitting: Pain Medicine

## 2022-03-26 ENCOUNTER — Other Ambulatory Visit: Payer: Self-pay | Admitting: Internal Medicine

## 2022-03-30 NOTE — Progress Notes (Addendum)
PROVIDER NOTE: Information contained herein reflects review and annotations entered in association with encounter. Interpretation of such information and data should be left to medically-trained personnel. Information provided to patient can be located elsewhere in the medical record under "Patient Instructions". Document created using STT-dictation technology, any transcriptional errors that may result from process are unintentional.    Patient: [name]  Service Category: E/M  Provider: Gaspar Cola, MD  DOB: 11/08/1945  DOS: 04/01/2022  Specialty: Interventional Pain Management  MRN: [id number]  Setting: Ambulatory outpatient  PCP: [name], MD  Type: Established Patient    Referring Provider: [name], MD  Location: Office  Delivery: Face-to-face     HPI  [name], a 76 y.o. year old female, is here today because of her Chronic neck pain [M54.2, G89.29]. Ms. Drees's primary complain today is Neck Pain Last encounter: My last encounter with her was on 03/24/2022. Pertinent problems: Ms. 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; Shoulder impingement syndrome (Left); Chronic left shoulder pain; Chronic pain syndrome; Chronic shoulder pain (1ry area of Pain) (Bilateral) (L>R); Chronic upper extremity pain (2ry area of Pain) (Bilateral) (L>R); Cervicalgia; Chronic neck pain (3ry area of Pain) (Posterior) (Bilateral) (L>R); Shoulder blade pain (4th area of Pain) (Left); Osteoarthritis of glenohumeral joint (Left); Osteoarthritis of AC (acromioclavicular) joint (Left); Osteoarthritis of glenohumeral joints (Bilateral); Osteoarthritis of acromioclavicular joints (Bilateral); Primary osteoarthritis of shoulders (Bilateral); DDD (degenerative disc disease), cervical; Cervical radiculitis (Left); Cervical radiculopathy (Left); C6 radiculopathy (Left); C7 radiculopathy (Left); Unable to maintain body in lying position; Chronic intractable headache; DISH  (diffuse idiopathic skeletal hyperostosis); Abnormal CT scan, cervical spine (03/04/2022); Trigger point of shoulder region (Bilateral); and Trigger point with neck pain on their pertinent problem list. Pain Assessment: Severity of Chronic pain is reported as a 6 /10. Location: Neck Left, Right/pain radiaties down to her shoulder, arms and fingers. Onset: More than a month ago. Quality: Throbbing, Aching, Burning, Constant, Discomfort, Radiating, Nagging. Timing: Constant. Modifying factor(s): Med and heat. Vitals:  height is 4' 10"  (1.473 m) and weight is 185 lb (83.9 kg). Her temperature is 97.4 F (36.3 C) (abnormal). Her blood pressure is 116/66 and her pulse is 74. Her oxygen saturation is 94%.   Reason for encounter: follow-up evaluation to discuss the results of her recent cervical CT scan and to see if she would be a good candidate for injection therapy.  The patient has cervical CT shows no compressive pathology either central or foraminal.  However it does show evidence of diffuse idiopathic skeletal hyperostosis.  Because of the patient's COPD and oxygen dependence, she is not a good candidate for any type of therapies that require that she be laying down.  At this point, we will limit our interventions to trigger points of the upper back and shoulder area.  We will be scheduling that as soon as possible.  The patient is on Eliquis and therefore she will need to stop that for 3 days prior to the procedures.  Pharmacotherapy Assessment  Analgesic: Oxycodone/APAP 7.5/325, 1 tab p.o. daily (# 45) (last filled on 06/02/2021) by Erich Montane, FNP-C, working for Dr. Talmadge Chad, MD. MME/day: 7.5 mg/day   Monitoring: [place] PMP: PDMP reviewed during this encounter.       Pharmacotherapy: No side-effects or adverse reactions reported. Compliance: No problems identified. Effectiveness: Clinically acceptable.  Chauncey Fischer, RN  04/01/2022  2:27 PM  Signed Safety precautions to be  maintained throughout  the outpatient stay will include: orient to surroundings, keep bed in low position, maintain call bell within reach at all times, provide assistance with transfer out of bed and ambulation.     UDS:  Summary  Date Value Ref Range Status  04/30/2021 Note  Final    Comment:    ==================================================================== Compliance Drug Analysis, Ur ==================================================================== Test                             Result       Flag       Units  Drug Present and Declared for Prescription Verification   Alpha-hydroxyalprazolam        107          EXPECTED   ng/mg creat    Alpha-hydroxyalprazolam is an expected metabolite of alprazolam.    Source of alprazolam is a scheduled prescription medication.    Oxycodone                      1371         EXPECTED   ng/mg creat   Oxymorphone                    600          EXPECTED   ng/mg creat   Noroxycodone                   2311         EXPECTED   ng/mg creat    Sources of oxycodone include scheduled prescription medications.    Oxymorphone and noroxycodone are expected metabolites of oxycodone.    Oxymorphone is also available as a scheduled prescription medication.    Sertraline                     PRESENT      EXPECTED   Desmethylsertraline            PRESENT      EXPECTED    Desmethylsertraline is an expected metabolite of sertraline.    Trazodone                      PRESENT      EXPECTED   1,3 chlorophenyl piperazine    PRESENT      EXPECTED    1,3-chlorophenyl piperazine is an expected metabolite of trazodone.    Acetaminophen                  PRESENT      EXPECTED  Drug Absent but Declared for Prescription Verification   Diclofenac                     Not Detected UNEXPECTED    Diclofenac, as indicated in the declared medication list, is not    always detected even when used as  directed.  ==================================================================== Test                      Result    Flag   Units      Ref Range   Creatinine              45               mg/dL      >=20 ==================================================================== Declared Medications:  The flagging and interpretation on this report are based on  the  following declared medications.  Unexpected results may arise from  inaccuracies in the declared medications.   **Note: The testing scope of this panel includes these medications:   Alprazolam (Xanax)  Oxycodone (Percocet)  Sertraline (Zoloft)  Trazodone (Desyrel)   **Note: The testing scope of this panel does not include small to  moderate amounts of these reported medications:   Acetaminophen (Tylenol)  Acetaminophen (Percocet)  Diclofenac (Voltaren)   **Note: The testing scope of this panel does not include the  following reported medications:   Albuterol (Ventolin HFA)  Apixaban (Eliquis)  Atorvastatin (Lipitor)  Bumetanide (Bumex)  Calcium  Flecainide (Tambocor)  Fluticasone (Trelegy)  Iron  Levothyroxine (Synthroid)  Lisinopril (Zestril)  Loratadine (Claritin)  Pantoprazole (Protonix)  Potassium (Klor-Con)  Ubiquinone (CoQ10)  Umeclidinium (Trelegy)  Vilanterol (Trelegy)  Vitamin D ==================================================================== For clinical consultation, please call [phone]. ====================================================================      ROS  Constitutional: Denies any fever or chills Gastrointestinal: No reported hemesis, hematochezia, vomiting, or acute GI distress Musculoskeletal: Denies any acute onset joint swelling, redness, loss of ROM, or weakness Neurological: No reported episodes of acute onset apraxia, aphasia, dysarthria, agnosia, amnesia, paralysis, loss of coordination, or loss of consciousness  Medication Review  ALPRAZolam,  Budeson-Glycopyrrol-Formoterol, Calcium Carbonate-Vitamin D, CoQ10, albuterol, amLODipine, apixaban, atorvastatin, bumetanide, carvedilol, diclofenac Sodium, empagliflozin, ferrous sulfate, flecainide, ipratropium-albuterol, levothyroxine, lisinopril, loratadine, oxyCODONE-acetaminophen, pantoprazole, potassium chloride, sertraline, sodium chloride, and traZODone  History Review  Allergy: Ms. Bergsma is allergic to benadryl [diphenhydramine hcl (sleep)], cetirizine, lasix [furosemide], levaquin [levofloxacin in d5w], meloxicam, soy allergy, and sulfa antibiotics. Drug: Ms. Marcou  reports no history of drug use. Alcohol:  reports no history of alcohol use. Tobacco:  reports that she has quit smoking. Her smoking use included cigarettes. She has never used smokeless tobacco. Social: Ms. Lainez  reports that she has quit smoking. Her smoking use included cigarettes. She has never used smokeless tobacco. She reports that she does not drink alcohol and does not use drugs. Medical:  has a past medical history of Aortic stenosis due to bicuspid aortic valve, Aspiration pneumonia ([name]), Atrial fibrillation with RVR ([place]) (01/11/2015), Atrial flutter ([place]), Basal skull fracture ([name]) (20 yrs ago), CHF (congestive heart failure) ([place]), Chronic respiratory failure ([place]), COPD (chronic obstructive pulmonary disease) ([place]), Deafness in left ear, Essential hypertension (01/24/2015), History of cardiac cath, History of stress test, HLD (hyperlipidemia), HTN (hypertension), Hypothyroidism, Obesity, PAF (paroxysmal atrial fibrillation) ([place]), Paroxysmal atrial fibrillation ([place]) (01/24/2015), Right upper quadrant abdominal tenderness without rebound tenderness (03/02/2018), and S/P ascending aortic aneurysm repair (2008). Surgical: Ms. Michalski  has a past surgical history that includes Abdominal aortic aneurysm repair (2008); Aortic valve replacement (2008); Abdominal hysterectomy; Carpal tunnel release; Tumor excision (Left);  Cardiac catheterization; Cardiac catheterization (N/A, 08/17/2016); CARDIOVERSION (N/A, 08/15/2018); CARDIOVERSION (N/A, 11/14/2018); Cardioversion (N/A, 06/05/2019); Cardioversion (N/A, 08/14/2019); Cardioversion (N/A, 11/09/2019); and Cardioversion (N/A, 01/17/2020). Family: family history includes Stroke in her father and paternal grandmother.  Laboratory Chemistry Profile   Renal Lab Results  Component Value Date   BUN 19 03/20/2022   CREATININE 0.89 03/20/2022   BCR 21 03/20/2022   GFRAA >60 05/14/2020   GFRNONAA >60 01/26/2022    Hepatic Lab Results  Component Value Date   AST 21 03/20/2022   ALT 13 03/20/2022   ALBUMIN 4.2 03/20/2022   ALKPHOS 113 03/20/2022   LIPASE 124 10/16/2011    Electrolytes Lab Results  Component Value Date   NA 140 03/20/2022   K 4.5 03/20/2022   CL 102 03/20/2022  CALCIUM 9.6 03/20/2022   MG 2.0 12/03/2021   PHOS 3.5 01/30/2021    Bone Lab Results  Component Value Date   VD25OH 25.5 (L) 11/25/2020   25OHVITD1 31 04/30/2021   25OHVITD2 <1.0 04/30/2021   25OHVITD3 31 04/30/2021    Inflammation (CRP: Acute Phase) (ESR: Chronic Phase) Lab Results  Component Value Date   CRP 6 04/30/2021   ESRSEDRATE 40 04/30/2021   LATICACIDVEN 1.5 01/25/2022         Note: Above Lab results reviewed.  Recent Imaging Review  CT CERVICAL SPINE WO CONTRAST CLINICAL DATA:  Axial pain, possible radicular component. Chronic pain of both upper extremities. Chronic neck pain. Recent fall.  EXAM: CT CERVICAL SPINE WITHOUT CONTRAST  TECHNIQUE: Multidetector CT imaging of the cervical spine was performed without intravenous contrast. Multiplanar CT image reconstructions were also generated.  RADIATION DOSE REDUCTION: This exam was performed according to the departmental dose-optimization program which includes automated exposure control, adjustment of the mA and/or kV according to patient size and/or use of iterative reconstruction technique.  COMPARISON:   Radiography 02/25/2019  FINDINGS: Alignment: Straightening of the normal cervical lordosis. No antero or retrolisthesis.  Skull base and vertebrae: No fracture. Prominent bridging osteophytes with solid fusion from C4 through T1, suggesting diffuse idiopathic skeletal hyperostosis.  Soft tissues and spinal canal: Negative  Disc levels: The foramen magnum is widely patent. There is ordinary mild osteoarthritis of the C1-2 articulation but no encroachment upon the neural structures.  C2-3: Prominent osteophytes without definite solid bridging. Mild facet osteoarthritis. No canal or foraminal stenosis.  C3-4: Prominent osteophytes with equivocal solid bridging. Wide patency of the canal and foramina.  C4 through T1: Solid bridging osteophytes. Disc space narrowing throughout the region. Small endplate osteophytes. No compressive canal stenosis. Mild bony foraminal narrowing bilaterally at C4-5 and on the left at C5-6, C6-7 and C7-T1.  Upper chest: Negative  Other: None  IMPRESSION: No apparent compressive stenosis of the canal or foramina. Prominent cervical and upper thoracic region osteophytes with areas of solid bone bridging suggesting diffuse idiopathic skeletal hyperostosis.  Electronically Signed   By: Nelson Chimes M.D.   On: 03/04/2022 15:36 Note: Reviewed        Physical Exam  General appearance: Well nourished, well developed, and well hydrated. In no apparent acute distress Mental status: Alert, oriented x 3 (person, place, & time)       Respiratory: No evidence of acute respiratory distress Eyes: PERLA Vitals: BP 116/66   Pulse 74   Temp (!) 97.4 F (36.3 C)   Ht 4' 10"  (1.473 m)   Wt 185 lb (83.9 kg)   SpO2 94% Comment: 4 liters  BMI 38.67 kg/m  BMI: Estimated body mass index is 38.67 kg/m as calculated from the following:   Height as of this encounter: 4' 10"  (1.473 m).   Weight as of this encounter: 185 lb (83.9 kg). Ideal: Patient must be at  least 60 in tall to calculate ideal body weight  Assessment   Diagnosis Status  1. Chronic neck pain (3ry area of Pain) (Posterior) (Bilateral) (L>R)   2. Cervical radiculopathy (Left)   3. Cervical radiculitis (Left)   4. C7 radiculopathy (Left)   5. C6 radiculopathy (Left)   6. Cervicalgia   7. Chronic intractable headache, unspecified headache type   8. Chronic left shoulder pain   9. Chronic upper extremity pain (2ry area of Pain) (Bilateral) (L>R)   10. DDD (degenerative disc disease), cervical   11. Degeneration  of intervertebral disc of mid-cervical region, unspecified spinal level   12. DISH (diffuse idiopathic skeletal hyperostosis)   13. Abnormal CT scan, cervical spine (03/04/2022)   14. Trigger point of shoulder region, unspecified laterality   15. Trigger point with neck pain   16. Chronic anticoagulation (Eliquis)    Controlled Controlled Controlled   Updated Problems: No problems updated.   Plan of Care  Problem-specific:  No problem-specific Assessment & Plan notes found for this encounter.  Ms. CASY TAVANO has a current medication list which includes the following long-term medication(s): albuterol, apixaban, atorvastatin, bumetanide, calcium carbonate-vitamin d, carvedilol, ferrous sulfate, ipratropium-albuterol, levothyroxine, lisinopril, pantoprazole, potassium chloride, sertraline, and trazodone.  Pharmacotherapy (Medications Ordered): No orders of the defined types were placed in this encounter.  Orders:  Orders Placed This Encounter  Procedures   TRIGGER POINT INJECTION    Standing Status:   Future    Standing Expiration Date:   07/02/2022    Scheduling Instructions:     Area: Upper Back and shoulders     Side: Bilateral     Sedation: No Sedation.     Timeframe: ASAA    Order Specific Question:   Where will this procedure be performed?    Answer:   [facility]   Blood Thinner Instructions to Nursing    Always make sure patient has  clearance from prescribing physician to stop blood thinners for interventional therapies. If the patient requires a Lovenox-bridge therapy, make sure arrangements are made to institute it with the assistance of the PCP.    Scheduling Instructions:     Have Ms. Enloe stop the Eliquis (Apixaban) x 3 days prior to procedure or surgery.   Follow-up plan:   Return for procedure [facility]): (B) upper back and shoulder TPPI/MNB #1, (Blood Thinner Protocol).     Interventional Therapies  Risk  Complexity Considerations:   Estimated body mass index is 38.87 kg/m as calculated from the following:   Height as of this encounter: 4' 10"  (1.473 m).   Weight as of this encounter: 186 lb (84.4 kg). 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 DISH: (Left C7-T1 calcified ligamentum flavum) (see 10/04/2021 cervical CT Se: 7 Im: 39/81; Se: 9 Im: 56/88; Se: 11 Im: 76/88; Se: 5 Im: 63/76)   Planned  Pending:   Therapeutic/palliative upper back and shoulder trigger point inj. #1    Under consideration:   Therapeutic/palliative upper back and shoulder trigger point inj.    Completed:   Diagnostic bilateral suprascapular NB x2 (09/25/2021) (100/100/100/100 x 2.5 months)    Therapeutic  Palliative (PRN) options:   Therapeutic/palliative upper back and shoulder trigger point inj.      Recent Visits Date Type Provider Dept  04/01/22 Office Visit Milinda Pointer, MD [facility]  Showing recent visits within past 90 days and meeting all other requirements Future Appointments Date Type Provider Dept  04/14/22 Appointment Milinda Pointer, MD [facility]  Showing future appointments within next 90 days and meeting all other requirements  I discussed the assessment and treatment plan  with the patient. The patient was provided an opportunity to ask questions and all were answered. The patient agreed with the plan and demonstrated an understanding of the instructions.  Patient advised to call back or seek an in-person evaluation if the symptoms or  condition worsens.  Duration of encounter: 30 minutes.  Total time on encounter, as per AMA guidelines included both the face-to-face and non-face-to-face time personally spent by the physician and/or other qualified health care professional(s) on the day of the encounter (includes time in activities that require the physician or other qualified health care professional and does not include time in activities normally performed by clinical staff). Physician's time may include the following activities when performed: preparing to see the patient (eg, review of tests, pre-charting review of records) obtaining and/or reviewing separately obtained history performing a medically appropriate examination and/or evaluation counseling and educating the patient/family/caregiver ordering medications, tests, or procedures referring and communicating with other health care professionals (when not separately reported) documenting clinical information in the electronic or other health record independently interpreting results (not separately reported) and communicating results to the patient/ family/caregiver care coordination (not separately reported)  Note by: Gaspar Cola, MD Date: 04/01/2022; Time: 9:34 AM

## 2022-04-01 ENCOUNTER — Encounter: Payer: Self-pay | Admitting: Pain Medicine

## 2022-04-01 ENCOUNTER — Ambulatory Visit: Payer: Medicare Other | Attending: Pain Medicine | Admitting: Pain Medicine

## 2022-04-01 VITALS — BP 116/66 | HR 74 | Temp 97.4°F | Ht <= 58 in | Wt 185.0 lb

## 2022-04-01 DIAGNOSIS — G8929 Other chronic pain: Secondary | ICD-10-CM | POA: Diagnosis not present

## 2022-04-01 DIAGNOSIS — M25512 Pain in left shoulder: Secondary | ICD-10-CM | POA: Diagnosis not present

## 2022-04-01 DIAGNOSIS — M79601 Pain in right arm: Secondary | ICD-10-CM | POA: Diagnosis not present

## 2022-04-01 DIAGNOSIS — Z7901 Long term (current) use of anticoagulants: Secondary | ICD-10-CM

## 2022-04-01 DIAGNOSIS — M79602 Pain in left arm: Secondary | ICD-10-CM | POA: Insufficient documentation

## 2022-04-01 DIAGNOSIS — G4733 Obstructive sleep apnea (adult) (pediatric): Secondary | ICD-10-CM | POA: Diagnosis not present

## 2022-04-01 DIAGNOSIS — M25519 Pain in unspecified shoulder: Secondary | ICD-10-CM | POA: Diagnosis not present

## 2022-04-01 DIAGNOSIS — M5030 Other cervical disc degeneration, unspecified cervical region: Secondary | ICD-10-CM

## 2022-04-01 DIAGNOSIS — M4810 Ankylosing hyperostosis [Forestier], site unspecified: Secondary | ICD-10-CM

## 2022-04-01 DIAGNOSIS — M5032 Other cervical disc degeneration, mid-cervical region, unspecified level: Secondary | ICD-10-CM | POA: Diagnosis not present

## 2022-04-01 DIAGNOSIS — R937 Abnormal findings on diagnostic imaging of other parts of musculoskeletal system: Secondary | ICD-10-CM

## 2022-04-01 DIAGNOSIS — M5412 Radiculopathy, cervical region: Secondary | ICD-10-CM

## 2022-04-01 DIAGNOSIS — R519 Headache, unspecified: Secondary | ICD-10-CM | POA: Insufficient documentation

## 2022-04-01 DIAGNOSIS — M542 Cervicalgia: Secondary | ICD-10-CM | POA: Diagnosis not present

## 2022-04-01 NOTE — Progress Notes (Signed)
Safety precautions to be maintained throughout the outpatient stay will include: orient to surroundings, keep bed in low position, maintain call bell within reach at all times, provide assistance with transfer out of bed and ambulation.  

## 2022-04-01 NOTE — Patient Instructions (Signed)
______________________________________________________________________  Preparing for your procedure (without sedation)  Procedure appointments are limited to planned procedures: No Prescription Refills. No disability issues will be discussed. No medication changes will be discussed.  Instructions: Food Intake: Avoid eating anything for at least 4 hours prior to your procedure. Transportation: Unless otherwise stated by your physician, bring a driver. Morning Medicines: Take all of your scheduled morning medications. If you take heart medicine, except for blood thinners, do not forget to take it the morning of the procedure. If your Diastolic (lower reading) is above 100 mmHg, elective cases will be cancelled/rescheduled. Blood thinners: These will need to be stopped for procedures. Notify our staff if you are taking any blood thinners. Depending on which one you take, there will be specific instructions on how and when to stop it. Diabetics on insulin: Notify the staff so that you can be scheduled 1st case in the morning. If your diabetes requires high dose insulin, take only  of your normal insulin dose the morning of the procedure and notify the staff that you have done so. Preventing infections: Shower with an antibacterial soap the morning of your procedure.  Build-up your immune system: Take 1000 mg of Vitamin C with every meal (3 times a day) the day prior to your procedure. Antibiotics: Inform the staff if you have a condition or reason that requires you to take antibiotics before dental procedures. Pregnancy: If you are pregnant, call and cancel the procedure. Sickness: If you have a cold, fever, or any active infections, call and cancel the procedure. Arrival: You must be in the facility at least 30 minutes prior to your scheduled procedure. Children: Do not bring any children with you. Dress appropriately: There is always a possibility that your clothing may get soiled. Valuables:  Do not bring any jewelry or valuables.  Reasons to call and reschedule or cancel your procedure: (Following these recommendations will minimize the risk of a serious complication.) Surgeries: Avoid having procedures within 2 weeks of any surgery. (Avoid for 2 weeks before or after any surgery). Flu Shots: Avoid having procedures within 2 weeks of a flu shots or . (Avoid for 2 weeks before or after immunizations). Barium: Avoid having a procedure within 7-10 days after having had a radiological study involving the use of radiological contrast. (Myelograms, Barium swallow or enema study). Heart attacks: Avoid any elective procedures or surgeries for the initial 6 months after a "Myocardial Infarction" (Heart Attack). Blood thinners: It is imperative that you stop these medications before procedures. Let us know if you if you take any blood thinner.  Infection: Avoid procedures during or within two weeks of an infection (including chest colds or gastrointestinal problems). Symptoms associated with infections include: Localized redness, fever, chills, night sweats or profuse sweating, burning sensation when voiding, cough, congestion, stuffiness, runny nose, sore throat, diarrhea, nausea, vomiting, cold or Flu symptoms, recent or current infections. It is specially important if the infection is over the area that we intend to treat. Heart and lung problems: Symptoms that may suggest an active cardiopulmonary problem include: cough, chest pain, breathing difficulties or shortness of breath, dizziness, ankle swelling, uncontrolled high or unusually low blood pressure, and/or palpitations. If you are experiencing any of these symptoms, cancel your procedure and contact your primary care physician for an evaluation.  Remember:  Regular Business hours are:  Monday to Thursday 8:00 AM to 4:00 PM  Provider's Schedule: Milinda Pointer, MD:  Procedure days: Tuesday and Thursday 7:30 AM to 4:00 PM  Gillis Santa, MD:  Procedure days: Monday and Wednesday 7:30 AM to 4:00 PM ______________________________________________________________________  ____________________________________________________________________________________________  General Risks and Possible Complications  Patient Responsibilities: It is important that you read this as it is part of your informed consent. It is our duty to inform you of the risks and possible complications associated with treatments offered to you. It is your responsibility as a patient to read this and to ask questions about anything that is not clear or that you believe was not covered in this document.  Patient's Rights: You have the right to refuse treatment. You also have the right to change your mind, even after initially having agreed to have the treatment done. However, under this last option, if you wait until the last second to change your mind, you may be charged for the materials used up to that point.  Introduction: Medicine is not an Chief Strategy Officer. Everything in Medicine, including the lack of treatment(s), carries the potential for danger, harm, or loss (which is by definition: Risk). In Medicine, a complication is a secondary problem, condition, or disease that can aggravate an already existing one. All treatments carry the risk of possible complications. The fact that a side effects or complications occurs, does not imply that the treatment was conducted incorrectly. It must be clearly understood that these can happen even when everything is done following the highest safety standards.  No treatment: You can choose not to proceed with the proposed treatment alternative. The "PRO(s)" would include: avoiding the risk of complications associated with the therapy. The "CON(s)" would include: not getting any of the treatment benefits. These benefits fall under one of three categories: diagnostic; therapeutic; and/or palliative. Diagnostic benefits  include: getting information which can ultimately lead to improvement of the disease or symptom(s). Therapeutic benefits are those associated with the successful treatment of the disease. Finally, palliative benefits are those related to the decrease of the primary symptoms, without necessarily curing the condition (example: decreasing the pain from a flare-up of a chronic condition, such as incurable terminal cancer).  General Risks and Complications: These are associated to most interventional treatments. They can occur alone, or in combination. They fall under one of the following six (6) categories: no benefit or worsening of symptoms; bleeding; infection; nerve damage; allergic reactions; and/or death. No benefits or worsening of symptoms: In Medicine there are no guarantees, only probabilities. No healthcare provider can ever guarantee that a medical treatment will work, they can only state the probability that it may. Furthermore, there is always the possibility that the condition may worsen, either directly, or indirectly, as a consequence of the treatment. Bleeding: This is more common if the patient is taking a blood thinner, either prescription or over the counter (example: Goody Powders, Fish oil, Aspirin, Garlic, etc.), or if suffering a condition associated with impaired coagulation (example: Hemophilia, cirrhosis of the liver, low platelet counts, etc.). However, even if you do not have one on these, it can still happen. If you have any of these conditions, or take one of these drugs, make sure to notify your treating physician. Infection: This is more common in patients with a compromised immune system, either due to disease (example: diabetes, cancer, human immunodeficiency virus [HIV], etc.), or due to medications or treatments (example: therapies used to treat cancer and rheumatological diseases). However, even if you do not have one on these, it can still happen. If you have any of these  conditions, or take one of these drugs, make  sure to notify your treating physician. Nerve Damage: This is more common when the treatment is an invasive one, but it can also happen with the use of medications, such as those used in the treatment of cancer. The damage can occur to small secondary nerves, or to large primary ones, such as those in the spinal cord and brain. This damage may be temporary or permanent and it may lead to impairments that can range from temporary numbness to permanent paralysis and/or brain death. Allergic Reactions: Any time a substance or material comes in contact with our body, there is the possibility of an allergic reaction. These can range from a mild skin rash (contact dermatitis) to a severe systemic reaction (anaphylactic reaction), which can result in death. Death: In general, any medical intervention can result in death, most of the time due to an unforeseen complication. ____________________________________________________________________________________________ ____________________________________________________________________________________________  Blood Thinners  IMPORTANT NOTICE:  If you take any of these, make sure to notify the nursing staff.  Failure to do so may result in injury.  Recommended time intervals to stop and restart blood-thinners, before & after invasive procedures  Generic Name Brand Name Pre-procedure. Stop this long before procedure. Post-procedure. Minimum waiting period before restarting.  Abciximab Reopro 15 days 2 hrs  Alteplase Activase 10 days 10 days  Anagrelide Agrylin    Apixaban Eliquis 3 days 6 hrs  Cilostazol Pletal 3 days 5 hrs  Clopidogrel Plavix 7-10 days 2 hrs  Dabigatran Pradaxa 5 days 6 hrs  Dalteparin Fragmin 24 hours 4 hrs  Dipyridamole Aggrenox 11days 2 hrs  Edoxaban Lixiana; Savaysa 3 days 2 hrs  Enoxaparin  Lovenox 24 hours 4 hrs  Eptifibatide Integrillin 8 hours 2 hrs  Fondaparinux  Arixtra 72 hours  12 hrs  Hydroxychloroquine Plaquenil 11 days   Prasugrel Effient 7-10 days 6 hrs  Reteplase Retavase 10 days 10 days  Rivaroxaban Xarelto 3 days 6 hrs  Ticagrelor Brilinta 5-7 days 6 hrs  Ticlopidine Ticlid 10-14 days 2 hrs  Tinzaparin Innohep 24 hours 4 hrs  Tirofiban Aggrastat 8 hours 2 hrs  Warfarin Coumadin 5 days 2 hrs   Other medications with blood-thinning effects  Product indications Generic (Brand) names Note  Cholesterol Lipitor Stop 4 days before procedure  Blood thinner (injectable) Heparin (LMW or LMWH Heparin) Stop 24 hours before procedure  Cancer Ibrutinib (Imbruvica) Stop 7 days before procedure  Malaria/Rheumatoid Hydroxychloroquine (Plaquenil) Stop 11 days before procedure  Thrombolytics  10 days before or after procedures   Over-the-counter (OTC) Products with blood-thinning effects  Product Common names Stop Time  Aspirin > 325 mg Goody Powders, Excedrin, etc. 11 days  Aspirin ? 81 mg  7 days  Fish oil  4 days  Garlic supplements  7 days  Ginkgo biloba  36 hours  Ginseng  24 hours  NSAIDs Ibuprofen, Naprosyn, etc. 3 days  Vitamin E  4 days   ____________________________________________________________________________________________

## 2022-04-03 ENCOUNTER — Other Ambulatory Visit: Payer: Self-pay

## 2022-04-03 MED ORDER — TRAZODONE HCL 50 MG PO TABS: 50.0000 mg | ORAL_TABLET | Freq: Every day | ORAL | 1 refills | Status: DC

## 2022-04-04 ENCOUNTER — Other Ambulatory Visit: Payer: Self-pay | Admitting: Internal Medicine

## 2022-04-06 ENCOUNTER — Encounter: Payer: Self-pay | Admitting: Family

## 2022-04-06 ENCOUNTER — Telehealth: Payer: Self-pay

## 2022-04-06 ENCOUNTER — Ambulatory Visit: Payer: Medicare Other | Attending: Family | Admitting: Family

## 2022-04-06 VITALS — BP 113/46 | HR 70 | Resp 18 | Ht <= 58 in | Wt 187.5 lb

## 2022-04-06 DIAGNOSIS — I4891 Unspecified atrial fibrillation: Secondary | ICD-10-CM | POA: Insufficient documentation

## 2022-04-06 DIAGNOSIS — I5022 Chronic systolic (congestive) heart failure: Secondary | ICD-10-CM | POA: Insufficient documentation

## 2022-04-06 DIAGNOSIS — Z79899 Other long term (current) drug therapy: Secondary | ICD-10-CM | POA: Insufficient documentation

## 2022-04-06 DIAGNOSIS — J449 Chronic obstructive pulmonary disease, unspecified: Secondary | ICD-10-CM | POA: Diagnosis not present

## 2022-04-06 DIAGNOSIS — I480 Paroxysmal atrial fibrillation: Secondary | ICD-10-CM | POA: Diagnosis not present

## 2022-04-06 DIAGNOSIS — Z950 Presence of cardiac pacemaker: Secondary | ICD-10-CM | POA: Diagnosis not present

## 2022-04-06 DIAGNOSIS — Z7984 Long term (current) use of oral hypoglycemic drugs: Secondary | ICD-10-CM | POA: Diagnosis not present

## 2022-04-06 DIAGNOSIS — I10 Essential (primary) hypertension: Secondary | ICD-10-CM | POA: Diagnosis not present

## 2022-04-06 DIAGNOSIS — I110 Hypertensive heart disease with heart failure: Secondary | ICD-10-CM | POA: Diagnosis not present

## 2022-04-06 MED ORDER — EMPAGLIFLOZIN 10 MG PO TABS: 10.0000 mg | ORAL_TABLET | Freq: Every day | ORAL | 0 refills | Status: DC

## 2022-04-06 NOTE — Patient Instructions (Addendum)
Continue weighing daily and call for an overnight weight gain of 3 pounds or more or a weekly weight gain of more than 5 pounds.   If you have voicemail, please make sure your mailbox is cleaned out so that we may leave a message and please make sure to listen to any voicemails.    Begin jardiance as 1 tablet every morning.

## 2022-04-06 NOTE — Progress Notes (Signed)
Patient ID: [name], female    DOB: [birth year], 76 y.o.   MRN: [id number]  HPI  Ms Cavenaugh is a 76 y/o female with a history of hyperlipidemia, HTN, thyroid disease, COPD, left ear deafness, atrial fibrillation, previous tobacco use and chronic heart failure.   Echo report from 05/16/21 reviewed and showed an EF of 45% along with mild LVH. Echo report from 02/07/20 reviewed and showed an EF of >55% along with severe LAE. Catheterization done 02/07/20 but unable to view the results.   Admitted 01/25/22 due to pneumonia where she was treated with antibiotics and discharged after 2 days. Was in the ED 10/24/21 due to shortness of breath. Treated with IV bumex with additional oral bumex over the weekend and she was released.    She presents today for a follow-up visit with a chief complaint of minimal shortness of breath with moderate exertion. Describes this as chronic in nature. She has associated fatigue, cough, wheezing, difficulty sleeping and light-headedness along with this. She denies any anxiety, abdominal distention, palpitations, pedal edema, chest pain or weight gain.  Didn't realize her portable oxygen tank was off until her oxygen level in the office was checked and was initial low at 88%.   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 s/p bioprosthetic valve replacement 2008 at [facility];  b. 01/2015 Echo: EF 60-65%, no rwma, Gr1 DD, mildly dil LA, nl RV fxn.   Aspiration pneumonia ([place])    Atrial fibrillation with RVR ([place]) 01/11/2015   Atrial flutter ([place])    a. 08/2016 s/p DCCV.  Remains on flecainide 50 mg bid.   Basal skull fracture ([name]) 20 yrs ago   CHF (congestive heart failure) ([name])    Chronic respiratory failure ([name])    COPD (chronic obstructive pulmonary disease) ([place])    a. 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 2008 prior to Aortic aneurysm repair-->nl cors.   History of stress test    a. 10/2015 MV: no ischemia/infarct.   HLD (hyperlipidemia)    HTN (hypertension)    Hypothyroidism    Obesity    PAF (paroxysmal atrial fibrillation) ([name])    a. on Eliquis; b. CHADS2VASc = 3 (HTN, age x 82, female).   Paroxysmal atrial fibrillation ([name]) 01/24/2015   Right upper quadrant abdominal tenderness without rebound tenderness 03/02/2018   S/P ascending aortic aneurysm repair 2008   Past Surgical History:  Procedure Laterality Date   ABDOMINAL AORTIC ANEURYSM REPAIR  2008   ABDOMINAL HYSTERECTOMY     AORTIC VALVE REPLACEMENT  2008   CARDIAC CATHETERIZATION     [facility]   CARDIOVERSION N/A 08/15/2018   Procedure: CARDIOVERSION;  Surgeon: [name], MD;  Location: [facility] ORS;  Service: Cardiovascular;  Laterality: N/A;   CARDIOVERSION N/A 11/14/2018   Procedure: CARDIOVERSION (CATH LAB);  Surgeon: [name], MD;  Location: [facility];  Service: Cardiovascular;  Laterality: N/A;   CARDIOVERSION N/A 06/05/2019   Procedure: CARDIOVERSION;  Surgeon: [name], MD;  Location: [facility];  Service: Cardiovascular;  Laterality: N/A;   CARDIOVERSION N/A 08/14/2019   Procedure: CARDIOVERSION;  Surgeon: [name], MD;  Location: [facility] ORS;  Service: Cardiovascular;  Laterality: N/A;   CARDIOVERSION N/A 11/09/2019   Procedure:  CARDIOVERSION;  Surgeon: Isaias Cowman, MD;  Location: [facility];  Service: Cardiovascular;  Laterality: N/A;   CARDIOVERSION N/A 01/17/2020   Procedure: CARDIOVERSION;  Surgeon: Isaias Cowman, MD;  Location: [facility] ORS;  Service: Cardiovascular;  Laterality: N/A;   CARPAL TUNNEL RELEASE     ELECTROPHYSIOLOGIC STUDY N/A 08/17/2016   Procedure: Cardioversion;  Surgeon: [name], MD;  Location: [facility] ORS;  Service: Cardiovascular;  Laterality: N/A;   TUMOR EXCISION Left    x3 (arm)   Family History  Problem Relation Age of Onset   Stroke Father     Stroke Paternal Grandmother    Social History   Tobacco Use   Smoking status: Former    Types: Cigarettes   Smokeless tobacco: Never  Substance Use Topics   Alcohol use: No   Allergies  Allergen Reactions   Benadryl [Diphenhydramine Hcl (Sleep)] Palpitations   Cetirizine Palpitations   Lasix [Furosemide] Rash   Levaquin [Levofloxacin In D5w] Other (See Comments)    Reaction:  Fatigue and muscle soreness   Meloxicam Rash   Soy Allergy Hives and Nausea And Vomiting   Sulfa Antibiotics Rash   Prior to Admission medications   Medication Sig Start Date End Date Taking? Authorizing Provider  albuterol (VENTOLIN HFA) 108 (90 Base) MCG/ACT inhaler Inhale 2 puffs into the lungs every 6 (six) hours as needed for wheezing or shortness of breath. 10/27/21  Yes Merlyn Lot, MD  ALPRAZolam Duanne Moron) 0.25 MG tablet Take 1 tablet (0.25 mg total) by mouth 2 (two) times daily as needed for anxiety. 02/03/22  Yes [name], MD  amLODipine (NORVASC) 10 MG tablet Take 1 tablet by mouth daily. 07/08/21 07/08/22 Yes [provider]  apixaban (ELIQUIS) 5 MG TABS tablet Take 1 tablet (5 mg total) by mouth 2 (two) times daily. 11/04/21  Yes [name], MD  atorvastatin (LIPITOR) 40 MG tablet TAKE 1 TABLET BY MOUTH EVERY NIGHT AT BEDTIME 03/26/22  Yes [name], MD  Budeson-Glycopyrrol-Formoterol (BREZTRI AEROSPHERE) 160-9-4.8 MCG/ACT AERO Inhale 2 puffs into the lungs 2 (two) times daily. 01/22/22  Yes Abernathy, Yetta Flock, NP  bumetanide (BUMEX) 1 MG tablet TAKE 2 TABLETS BY MOUTH TWICE DAILY 08/31/21  Yes Darylene Price A, FNP  Calcium Carbonate-Vitamin D (CALCIUM 600+D PO) Take 1 tablet by mouth daily.   Yes [provider]  carvedilol (COREG) 6.25 MG tablet Take 1 tablet (6.25 mg total) by mouth 2 (two) times daily with a meal. 12/03/21  Yes Dwyane Dee, MD  Coenzyme Q10 (COQ10) 200 MG CAPS Take 200 mg by mouth daily.   Yes [provider]  diclofenac Sodium (VOLTAREN) 1  % GEL SMARTSIG:Gram(s) Topical Twice Daily 09/09/20  Yes [provider]  ferrous sulfate 325 (65 FE) MG tablet Take 1 tablet (325 mg total) by mouth daily with breakfast. 12/03/21  Yes Dwyane Dee, MD  flecainide (TAMBOCOR) 50 MG tablet Take 50 mg by mouth daily. 12/25/21  Yes [provider]  levothyroxine (SYNTHROID) 25 MCG tablet TAKE 1 TABLET BY MOUTH DAILY BEFORE BREAKFAST 03/22/22  Yes [name], MD  lisinopril (ZESTRIL) 40 MG tablet TAKE 1 TABLET(40 MG) BY MOUTH DAILY(DOSE INCREASE) 09/29/21  Yes Darylene Price A, FNP  loratadine (CLARITIN) 10 MG tablet Take 10 mg by mouth daily.   Yes [provider]  oxyCODONE-acetaminophen (PERCOCET) 7.5-325 MG tablet Take one tab po bid for pain severe 03/17/22  Yes [name], MD  pantoprazole (PROTONIX) 40 MG tablet TAKE 1 TABLET BY  MOUTH EVERY DAY 03/26/22  Yes [name], MD  potassium chloride (KLOR-CON) 10 MEQ tablet Take 4 tablets (40 mEq total) by mouth 2 (two) times daily. 10/06/21  Yes Abernathy, Yetta Flock, NP  sertraline (ZOLOFT) 100 MG tablet Take one tab a day for Anxiety 11/04/21  Yes [name], MD  sodium chloride (OCEAN) 0.65 % SOLN nasal spray Place 1 spray into both nostrils as needed for congestion.   Yes [provider]  traZODone (DESYREL) 50 MG tablet Take 1 tablet (50 mg total) by mouth at bedtime. 04/03/22  Yes [name], MD  ipratropium-albuterol (DUONEB) 0.5-2.5 (3) MG/3ML SOLN Inhale 3 mLs into the lungs every 4 (four) hours as needed. Inhale 3 mls into the lungs every 4 to 6 hours and as needed Patient not taking: Reported on 04/06/2022 01/29/22   Allyne Gee, MD    Review of Systems  Constitutional:  Positive for appetite change (decreased).  Eyes: Negative.   Respiratory:  Positive for cough (once in awhile), shortness of breath and wheezing.   Cardiovascular:  Negative for chest pain, palpitations and leg swelling.  Gastrointestinal:  Negative for abdominal distention and  abdominal pain.  Endocrine: Negative.   Genitourinary: Negative.   Musculoskeletal:  Positive for arthralgias (bilateral shoulder pain). Negative for back pain.  Skin: Negative.   Allergic/Immunologic: Negative.   Neurological:  Positive for light-headedness (at times). Negative for dizziness.  Hematological:  Negative for adenopathy. Does not bruise/bleed easily.  Psychiatric/Behavioral:  Positive for sleep disturbance (trouble sleeping due to shoulder pain; wearing cpap at bedtime). Negative for dysphoric mood. The patient is not nervous/anxious.     Vitals:   04/06/22 1353  BP: (!) 113/46  Pulse: 70  Resp: 18  SpO2: 91%  Weight: 187 lb 8 oz (85 kg)  Height: 4\' 10"  (1.473 m)   Wt Readings from Last 3 Encounters:  04/06/22 187 lb 8 oz (85 kg)  04/01/22 185 lb (83.9 kg)  03/17/22 188 lb 3.2 oz (85.4 kg)   Lab Results  Component Value Date   CREATININE 0.89 03/20/2022   CREATININE 0.74 01/26/2022   CREATININE 0.85 01/25/2022   Physical Exam Vitals and nursing note reviewed.  Constitutional:      Appearance: Normal appearance.  HENT:     Head: Normocephalic and atraumatic.     Left Ear: Decreased hearing noted.  Cardiovascular:     Rate and Rhythm: Normal rate and regular rhythm.  Pulmonary:     Effort: Pulmonary effort is normal. No tachypnea or respiratory distress.     Breath sounds: No wheezing or rales.  Abdominal:     General: There is no distension.     Palpations: Abdomen is soft.  Musculoskeletal:     Cervical back: Normal range of motion and neck supple.     Right lower leg: No edema.     Left lower leg: No edema.  Skin:    General: Skin is warm and dry.  Neurological:     General: No focal deficit present.     Mental Status: She is alert and oriented to person, place, and time.  Psychiatric:        Mood and Affect: Mood is not anxious.        Behavior: Behavior normal.        Thought Content: Thought content normal.    Assessment & Plan:  1:  Chronic heart failure with mildly reduced ejection fraction- - NYHA class II - euvolemic today -  weighing daily; reminded to call for an overnight weight gain of >2 pounds or a weekly weight gain of >5 pounds - weight down 4 pounds from last visit here 5 months ago - not adding salt and is using pepper for seasoning; reviewed the importance of closely following a low sodium diet  - had telemedicine visit with cardiothoracic provider ([place]) 07/23/21 for aortic valve - on GDMT of lisinopril & carvedilol - will add jardiance 10mg  daily; voucher provided and 2 weeks samples also provided; may be able to decrease bumex if she tolerates jardiance - check BMP next visit - has had previous valve surgery  - BNP 01/25/22 was 127.6  2: HTN- - BP looked good (113/46) - saw PCP Stephanie Coup) 03/17/22 - BMP 01/26/22 reviewed and showed sodium 139, potassium 4.1, creatinine 0.74 and GFR >60  3: severe COPD- - wearing oxygen at 4L around the clock at home; when she comes to appointments, she has to put her portable tank on 3L/ this tank is pulse and she doesn't like it as much as her continuous oxygen at home - her initial pulse ox was 88-91% because her tank had shut off; when she turned it back on, she quickly rose to 94% - saw pulmonology Humphrey Rolls) 02/17/22  4: Atrial fibrillation-  - dual chamber pacemaker placed June 2021 - has had multiple cardioversions - saw cardiology ([place]) 12/11/21   Patient did not bring her medications nor a list. Each medication was verbally reviewed with the patient and she was encouraged to bring the bottles to every visit to confirm accuracy of list.   Return in 1 month, sooner if needed.

## 2022-04-06 NOTE — Telephone Encounter (Signed)
Left message for patient letting her know that Trazadone, 50mg , was sent to [facility] on the corner of [address].

## 2022-04-13 NOTE — Progress Notes (Unsigned)
PROVIDER NOTE: Interpretation of information contained herein should be left to medically-trained personnel. Specific patient instructions are provided elsewhere under "Patient Instructions" section of medical record. This document was created in part using STT-dictation technology, any transcriptional errors that may result from this process are unintentional.  Patient: [name] Type: Established DOB: 1945/09/20 MRN: [id number] PCP: [name], MD  Service: Procedure DOS: 04/14/2022 Setting: Ambulatory Location: Ambulatory outpatient facility Delivery: Face-to-face Provider: Gaspar Cola, MD Specialty: Interventional Pain Management Specialty designation: 09 Location: Outpatient facility Ref. Prov.: [name], MD    Primary Reason for Visit: Interventional Pain Management Treatment. CC: Shoulder Pain (Left and right)    Procedure:          Anesthesia, Analgesia, Anxiolysis:  Type: Trapezius and supraspinatus muscle Trigger Point Injection (1-2 muscle groups) #1  CPT: 20552 Primary Purpose: Therapeutic Region: Upper Cervicothoracic and shoulder Level: Cervico-thoracic/shoulder Target Area: Trapezius and supraspinatus muscle Trigger Point Approach: Percutaneous, ipsilateral approach. Laterality: Bilateral        Type: Local Anesthesia Local Anesthetic: Lidocaine 1-2% Sedation: None  Indication(s):  Analgesia Route: Infiltration ([place]/IM) IV Access: N/A   Position: Sitting   1. Trigger point with neck pain   2. Trigger point of shoulder region, unspecified laterality   3. Shoulder blade pain (4th area of Pain) (Left)   4. Chronic shoulder pain (1ry area of Pain) (Bilateral) (L>R)   5. Chronic neck pain (3ry area of Pain) (Posterior) (Bilateral) (L>R)   6. Chronic upper back pain   7. Chronic myofascial pain   8. Musculoskeletal disorder involving upper trapezius muscle   9. Chronic anticoagulation (Eliquis)    NAS-11 Pain score:   Pre-procedure: 0-No  pain/10   Post-procedure: 0-No pain/10     Pre-op H&P Assessment:  [name] is a 76 y.o. (year old), female patient, seen today for interventional treatment. She  has a past surgical history that includes Abdominal aortic aneurysm repair (2008); Aortic valve replacement (2008); Abdominal hysterectomy; Carpal tunnel release; Tumor excision (Left); Cardiac catheterization; Cardiac catheterization (N/A, 08/17/2016); CARDIOVERSION (N/A, 08/15/2018); CARDIOVERSION (N/A, 11/14/2018); Cardioversion (N/A, 06/05/2019); Cardioversion (N/A, 08/14/2019); Cardioversion (N/A, 11/09/2019); and Cardioversion (N/A, 01/17/2020). [name] has a current medication list which includes the following prescription(s): albuterol, alprazolam, amlodipine, apixaban, atorvastatin, breztri aerosphere, bumetanide, calcium carbonate-vitamin d, carvedilol, coq10, diclofenac sodium, empagliflozin, ferrous sulfate, flecainide, ipratropium-albuterol, levothyroxine, lisinopril, loratadine, oxycodone-acetaminophen, pantoprazole, potassium chloride, sertraline, sodium chloride, and trazodone. Her primarily concern today is the Shoulder Pain (Left and right)  Initial Vital Signs:  Pulse/HCG Rate:    Temp: (!) 97.3 F (36.3 C) Resp: 16 BP:   SpO2: 96 %  BMI: Estimated body mass index is 38.46 kg/m as calculated from the following:   Height as of this encounter: 4\' 10"  (1.473 m).   Weight as of this encounter: 184 lb (83.5 kg).  Risk Assessment: Allergies: Reviewed. She is allergic to benadryl [diphenhydramine hcl (sleep)], cetirizine, lasix [furosemide], levaquin [levofloxacin in d5w], meloxicam, soy allergy, and sulfa antibiotics.  Allergy Precautions: None required Coagulopathies: Reviewed. None identified.  Blood-thinner therapy: None at this time Active Infection(s): Reviewed. None identified. [name] is afebrile  Site Confirmation: [name] was asked to confirm the procedure and laterality before marking the site Procedure  checklist: Completed Consent: Before the procedure and under the influence of no sedative(s), amnesic(s), or anxiolytics, the patient was informed of the treatment options, risks and possible complications. To fulfill our ethical and legal obligations, as recommended by the American Medical Association's Code  of Ethics, I have informed the patient of my clinical impression; the nature and purpose of the treatment or procedure; the risks, benefits, and possible complications of the intervention; the alternatives, including doing nothing; the risk(s) and benefit(s) of the alternative treatment(s) or procedure(s); and the risk(s) and benefit(s) of doing nothing. The patient was provided information about the general risks and possible complications associated with the procedure. These may include, but are not limited to: failure to achieve desired goals, infection, bleeding, organ or nerve damage, allergic reactions, paralysis, and death. In addition, the patient was informed of those risks and complications associated to the procedure, such as failure to decrease pain; infection; bleeding; organ or nerve damage with subsequent damage to sensory, motor, and/or autonomic systems, resulting in permanent pain, numbness, and/or weakness of one or several areas of the body; allergic reactions; (i.e.: anaphylactic reaction); and/or death. Furthermore, the patient was informed of those risks and complications associated with the medications. These include, but are not limited to: allergic reactions (i.e.: anaphylactic or anaphylactoid reaction(s)); adrenal axis suppression; blood sugar elevation that in diabetics may result in ketoacidosis or comma; water retention that in patients with history of congestive heart failure may result in shortness of breath, pulmonary edema, and decompensation with resultant heart failure; weight gain; swelling or edema; medication-induced neural toxicity; particulate matter embolism and  blood vessel occlusion with resultant organ, and/or nervous system infarction; and/or aseptic necrosis of one or more joints. Finally, the patient was informed that Medicine is not an exact science; therefore, there is also the possibility of unforeseen or unpredictable risks and/or possible complications that may result in a catastrophic outcome. The patient indicated having understood very clearly. We have given the patient no guarantees and we have made no promises. Enough time was given to the patient to ask questions, all of which were answered to the patient's satisfaction. [name] has indicated that she wanted to continue with the procedure. Attestation: I, the ordering provider, attest that I have discussed with the patient the benefits, risks, side-effects, alternatives, likelihood of achieving goals, and potential problems during recovery for the procedure that I have provided informed consent. Date  Time: 04/14/2022 11:22 AM  Pre-Procedure Preparation:  Monitoring: As per clinic protocol. Respiration, ETCO2, SpO2, BP, heart rate and rhythm monitor placed and checked for adequate function Safety Precautions: Patient was assessed for positional comfort and pressure points before starting the procedure. Time-out: I initiated and conducted the "Time-out" before starting the procedure, as per protocol. The patient was asked to participate by confirming the accuracy of the "Time Out" information. Verification of the correct person, site, and procedure were performed and confirmed by me, the nursing staff, and the patient. "Time-out" conducted as per Joint Commission's Universal Protocol (UP.01.01.01). Time: 1139  Description of Procedure:          Area Prepped: Entire Posterior shoulder and upper back Region DuraPrep (Iodine Povacrylex [0.7% available iodine] and Isopropyl Alcohol, 74% w/w) Safety Precautions: Aspiration looking for blood return was conducted prior to all injections. At no point  did we inject any substances, as a needle was being advanced. No attempts were made at seeking any paresthesias. Safe injection practices and needle disposal techniques used. Medications properly checked for expiration dates. SDV (single dose vial) medications used. Description of the Procedure: Protocol guidelines were followed. The patient was placed in position over the fluoroscopy table. The target area was identified and the area prepped in the usual manner. Skin & deeper tissues infiltrated with  local anesthetic. Appropriate amount of time allowed to pass for local anesthetics to take effect. The procedure needles were then advanced to the target area. Proper needle placement secured. Negative aspiration confirmed. Solution injected in intermittent fashion, asking for systemic symptoms every 0.5cc of injectate. The needles were then removed and the area cleansed, making sure to leave some of the prepping solution back to take advantage of its long term bactericidal properties.  Vitals:   04/14/22 1119  Resp: 16  Temp: (!) 97.3 F (36.3 C)  SpO2: 96%  Weight: 184 lb (83.5 kg)  Height: 4\' 10"  (1.473 m)    Start Time: 1139 hrs. End Time: 1141 hrs. Materials:  Needle(s) Type: Epidural needle Gauge: 22G Length: 3.5-in Medication(s): Please see orders for medications and dosing details.  Imaging Guidance:          Type of Imaging Technique: None used Indication(s): N/A Exposure Time: No patient exposure Contrast: None used. Fluoroscopic Guidance: N/A Ultrasound Guidance: N/A Interpretation: N/A  Antibiotic Prophylaxis:   Anti-infectives (From admission, onward)    None      Indication(s): None identified  Post-operative Assessment:  Post-procedure Vital Signs:  Pulse/HCG Rate:    Temp: (!) 97.3 F (36.3 C) Resp: 16 BP:   SpO2: 96 %  EBL: None  Complications: No immediate post-treatment complications observed by team, or reported by patient.  Note: The patient  tolerated the entire procedure well. A repeat set of vitals were taken after the procedure and the patient was kept under observation following institutional policy, for this type of procedure. Post-procedural neurological assessment was performed, showing return to baseline, prior to discharge. The patient was provided with post-procedure discharge instructions, including a section on how to identify potential problems. Should any problems arise concerning this procedure, the patient was given instructions to immediately contact us, at any time, without hesitation. In any case, we plan to contact the patient by telephone for a follow-up status report regarding this interventional procedure.  Comments:  No additional relevant information.  Plan of Care  Orders:  Orders Placed This Encounter  Procedures   TRIGGER POINT INJECTION    Scheduling Instructions:     Area: Neck and upper back     Side: Bilateral     Sedation: No Sedation.     Timeframe: Today    Order Specific Question:   Where will this procedure be performed?    Answer:   [facility]   Informed Consent Details: Physician/Practitioner Attestation; Transcribe to consent form and obtain patient signature    Provider Attestation: I, [place] Dossie Arbour, MD, (Pain Management Specialist), the physician/practitioner, attest that I have discussed with the patient the benefits, risks, side effects, alternatives, likelihood of achieving goals and potential problems during recovery for the procedure that I have provided informed consent.    Scheduling Instructions:     Note: Always confirm laterality of pain with [name], before procedure.     Transcribe to consent form and obtain patient signature.    Order Specific Question:   Physician/Practitioner attestation of informed consent for procedure/surgical case    Answer:   I, the physician/practitioner, attest that I have discussed with the patient the benefits, risks, side effects,  alternatives, likelihood of achieving goals and potential problems during recovery for the procedure that I have provided informed consent.    Order Specific Question:   Procedure    Answer:   Myoneural Block (Trigger Point injection)    Order Specific Question:  Physician/Practitioner performing the procedure    Answer:   Idamay. Dossie Arbour MD    Order Specific Question:   Indication/Reason    Answer:   Musculoskeletal pain/myofascial pain secondary to trigger point   Care order/instruction: Please confirm that the patient has stopped the Eliquis (Apixaban) x 3 days prior to procedure or surgery.    Please confirm that the patient has stopped the Eliquis (Apixaban) x 3 days prior to procedure or surgery.    Standing Status:   Standing    Number of Occurrences:   1   Provide equipment / supplies at bedside    "Block Tray" (Disposable  single use) Needle type: SpinalRegular Amount/quantity: 2 Size: Short(1.5-inch) Gauge: 25G    Standing Status:   Standing    Number of Occurrences:   1    Order Specific Question:   Specify    Answer:   Block Tray   Bleeding precautions    Standing Status:   Standing    Number of Occurrences:   1   Chronic Opioid Analgesic:  Oxycodone/APAP 7.5/325, 1 tab p.o. daily (# 45) (last filled on 06/02/2021) by Erich Montane, FNP-C, working for Dr. Talmadge Chad, MD. MME/day: 7.5 mg/day   Medications ordered for procedure: Meds ordered this encounter  Medications   ropivacaine (PF) 2 mg/mL (0.2%) (NAROPIN) injection 9 mL   triamcinolone acetonide (KENALOG-40) injection 40 mg   pentafluoroprop-tetrafluoroeth (GEBAUERS) aerosol   lidocaine (XYLOCAINE) 2 % (with pres) injection 400 mg   Medications administered: We administered ropivacaine (PF) 2 mg/mL (0.2%), triamcinolone acetonide, pentafluoroprop-tetrafluoroeth, and lidocaine.  See the medical record for exact dosing, route, and time of administration.  Follow-up plan:   Return in about  2 weeks (around 04/28/2022) for Proc-day (T,Th), (F2F), (PPE).       Interventional Therapies  Risk  Complexity Considerations:   Estimated body mass index is 38.87 kg/m as calculated from the following:   Height as of this encounter: 4\' 10"  (1.473 m).   Weight as of this encounter: 186 lb (84.4 kg). 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 DISH: (Left C7-T1 calcified ligamentum flavum) (see 10/04/2021 cervical CT Se: 7 Im: 39/81; Se: 9 Im: 56/88; Se: 11 Im: 76/88; Se: 5 Im: 63/76)   Planned  Pending:   Therapeutic/palliative upper back and shoulder trigger point inj. #1    Under consideration:   Therapeutic/palliative upper back and shoulder trigger point inj.    Completed:   Diagnostic bilateral suprascapular NB x2 (09/25/2021) (100/100/100/100 x 2.5 months)    Therapeutic  Palliative (PRN) options:   Therapeutic/palliative upper back and shoulder trigger point inj.       Recent Visits Date Type Provider Dept  04/01/22 Office Visit Milinda Pointer, MD [facility]  Showing recent visits within past 90 days and meeting all other requirements Today's Visits Date Type Provider Dept  04/14/22 Procedure visit Milinda Pointer, MD [facility]  Showing today's visits and meeting all other requirements Future Appointments Date Type Provider Dept  04/28/22 Appointment Milinda Pointer, MD [facility]  Showing future appointments within next 90 days and meeting all other requirements  Disposition: Discharge home  Discharge (Date  Time): 04/14/2022; 1145 hrs.   Primary Care Physician: [name], MD Location: [facility] Note by: Kathlen Brunswick  Dossie Arbour, MD Date: 04/14/2022; Time: 12:21 PM  Disclaimer:  Medicine  is not an Chief Strategy Officer. The only guarantee in medicine is that nothing is guaranteed. It is important to note that the decision to proceed with this intervention was based on the information collected from the patient. The Data and conclusions were drawn from the patient's questionnaire, the interview, and the physical examination. Because the information was provided in large part by the patient, it cannot be guaranteed that it has not been purposely or unconsciously manipulated. Every effort has been made to obtain as much relevant data as possible for this evaluation. It is important to note that the conclusions that lead to this procedure are derived in large part from the available data. Always take into account that the treatment will also be dependent on availability of resources and existing treatment guidelines, considered by other Pain Management Practitioners as being common knowledge and practice, at the time of the intervention. For Medico-Legal purposes, it is also important to point out that variation in procedural techniques and pharmacological choices are the acceptable norm. The indications, contraindications, technique, and results of the above procedure should only be interpreted and judged by a Board-Certified Interventional Pain Specialist with extensive familiarity and expertise in the same exact procedure and technique.

## 2022-04-14 ENCOUNTER — Ambulatory Visit: Payer: Medicare Other | Attending: Pain Medicine | Admitting: Pain Medicine

## 2022-04-14 VITALS — Temp 97.3°F | Resp 16 | Ht <= 58 in | Wt 184.0 lb

## 2022-04-14 DIAGNOSIS — M629 Disorder of muscle, unspecified: Secondary | ICD-10-CM | POA: Insufficient documentation

## 2022-04-14 DIAGNOSIS — M542 Cervicalgia: Secondary | ICD-10-CM | POA: Insufficient documentation

## 2022-04-14 DIAGNOSIS — M25512 Pain in left shoulder: Secondary | ICD-10-CM | POA: Insufficient documentation

## 2022-04-14 DIAGNOSIS — M549 Dorsalgia, unspecified: Secondary | ICD-10-CM | POA: Diagnosis not present

## 2022-04-14 DIAGNOSIS — M7918 Myalgia, other site: Secondary | ICD-10-CM | POA: Diagnosis not present

## 2022-04-14 DIAGNOSIS — G8929 Other chronic pain: Secondary | ICD-10-CM | POA: Diagnosis not present

## 2022-04-14 DIAGNOSIS — M898X1 Other specified disorders of bone, shoulder: Secondary | ICD-10-CM | POA: Insufficient documentation

## 2022-04-14 DIAGNOSIS — M25519 Pain in unspecified shoulder: Secondary | ICD-10-CM | POA: Diagnosis not present

## 2022-04-14 DIAGNOSIS — M25511 Pain in right shoulder: Secondary | ICD-10-CM | POA: Diagnosis not present

## 2022-04-14 DIAGNOSIS — Z7901 Long term (current) use of anticoagulants: Secondary | ICD-10-CM | POA: Insufficient documentation

## 2022-04-14 MED ORDER — LIDOCAINE HCL 2 % IJ SOLN
INTRAMUSCULAR | Status: AC
  Filled 2022-04-14: qty 20

## 2022-04-14 MED ORDER — LIDOCAINE HCL 2 % IJ SOLN
20.0000 mL | Freq: Once | INTRAMUSCULAR | Status: AC
  Administered 2022-04-14: 400 mg

## 2022-04-14 MED ORDER — ROPIVACAINE HCL 2 MG/ML IJ SOLN
9.0000 mL | Freq: Once | INTRAMUSCULAR | Status: AC
  Administered 2022-04-14: 9 mL

## 2022-04-14 MED ORDER — PENTAFLUOROPROP-TETRAFLUOROETH EX AERO
INHALATION_SPRAY | Freq: Once | CUTANEOUS | Status: AC
  Administered 2022-04-14: 30 via TOPICAL
  Filled 2022-04-14: qty 116

## 2022-04-14 MED ORDER — ROPIVACAINE HCL 2 MG/ML IJ SOLN
INTRAMUSCULAR | Status: AC
  Filled 2022-04-14: qty 20

## 2022-04-14 MED ORDER — TRIAMCINOLONE ACETONIDE 40 MG/ML IJ SUSP
40.0000 mg | Freq: Once | INTRAMUSCULAR | Status: AC
  Administered 2022-04-14: 40 mg

## 2022-04-14 MED ORDER — TRIAMCINOLONE ACETONIDE 40 MG/ML IJ SUSP
INTRAMUSCULAR | Status: AC
  Filled 2022-04-14: qty 1

## 2022-04-14 NOTE — Patient Instructions (Signed)
____________________________________________________________________________________________  Virtual Visits   What is a "Virtual Visit"? It is a healthcare communication encounter (medical visit) that takes place on real time (NOT TEXT or E-MAIL) over the telephone or computer device (desktop, laptop, tablet, smart phone, etc.). It allows for more location flexibility between the patient and the healthcare provider.  Who decides when these types of visits will be used? The physician.  Who is eligible for these types of visits? Only those patients that can be reliably reached over the telephone.  What do you mean by reliably? We do not have time to call everyone multiple times, therefore those that tend to screen calls and then call back later are not suitable candidates for this system. We understand how people are reluctant to pickup on "unknown" calls, therefore, we suggest adding our telephone numbers to your list of "CONTACT(s)". This way, you should be able to readily identify our calls when you receive one. All of our numbers are available below.   Who is not eligible? This option is not available for medication management encounters, specially for controlled substances. Patients on pain medications that fall under the category of controlled substances have to come in for "Face-to-Face" encounters. This is required for mandatory monitoring of these substances. You may be asked to provide a sample for an unannounced urine drug screening test (UDS), and we will need to count your pain pills. Not bringing your pills to be counted may result in no refill. Obviously, neither one of these can be done over the phone.  When will this type of visits be used? You can request a virtual visit whenever you are physically unable to attend a regular appointment. The decision will be made by the physician (or healthcare provider) on a case by case basis.   At what time will I be called? This is an  excellent question. The providers will try to call you whenever they have time available. Do not expect to be called at any specific time. The secretaries will assign you a time for your virtual visit appointment, but this is done simply to keep a list of those patients that need to be called, but not for the purpose of keeping a time schedule. Be advised that the call may come in anytime during the day, between the hours of 8:00 AM and 8::00 PM, depending on provider availability. We do understand that the system is not perfect. If you are unable to be available that day on a moments notice, then request an "in-person" appointment rather than a "virtual visit".  Can I request my medication visits to be "Virtual"? Yes you may request it, but the decision is entirely up to the healthcare provider. Control substances require specific monitoring that requires Face-to-Face encounters. The number of encounters  and the extent of the monitoring is determined on a case by case basis.  Add a new contact to your smart phone and label it "PAIN CLINIC" Under this contact add the following numbers: Main: [phone] (Official Contact Number) Nurses: [phone] (These are outgoing only calling systems. Do not call this number.) Dr. Odyssey Vasbinder: [phone] or [phone] (Outgoing calls only. Do not call this number.)  ____________________________________________________________________________________________    ____________________________________________________________________________________________  Post-Procedure Discharge Instructions  Instructions: Apply ice:  Purpose: This will minimize any swelling and discomfort after procedure.  When: Day of procedure, as soon as you get home. How: Fill a plastic sandwich bag with crushed ice. Cover it with a small towel and apply   to injection site. How long: (15 min on, 15 min off) Apply for 15 minutes then remove x 15 minutes.  Repeat sequence on  day of procedure, until you go to bed. Apply heat:  Purpose: To treat any soreness and discomfort from the procedure. When: Starting the next day after the procedure. How: Apply heat to procedure site starting the day following the procedure. How long: May continue to repeat daily, until discomfort goes away. Food intake: Start with clear liquids (like water) and advance to regular food, as tolerated.  Physical activities: Keep activities to a minimum for the first 8 hours after the procedure. After that, then as tolerated. Driving: If you have received any sedation, be responsible and do not drive. You are not allowed to drive for 24 hours after having sedation. Blood thinner: (Applies only to those taking blood thinners) You may restart your blood thinner 6 hours after your procedure. Insulin: (Applies only to Diabetic patients taking insulin) As soon as you can eat, you may resume your normal dosing schedule. Infection prevention: Keep procedure site clean and dry. Shower daily and clean area with soap and water. Post-procedure Pain Diary: Extremely important that this be done correctly and accurately. Recorded information will be used to determine the next step in treatment. For the purpose of accuracy, follow these rules: Evaluate only the area treated. Do not report or include pain from an untreated area. For the purpose of this evaluation, ignore all other areas of pain, except for the treated area. After your procedure, avoid taking a long nap and attempting to complete the pain diary after you wake up. Instead, set your alarm clock to go off every hour, on the hour, for the initial 8 hours after the procedure. Document the duration of the numbing medicine, and the relief you are getting from it. Do not go to sleep and attempt to complete it later. It will not be accurate. If you received sedation, it is likely that you were given a medication that may cause amnesia. Because of this,  completing the diary at a later time may cause the information to be inaccurate. This information is needed to plan your care. Follow-up appointment: Keep your post-procedure follow-up evaluation appointment after the procedure (usually 2 weeks for most procedures, 6 weeks for radiofrequencies). DO NOT FORGET to bring you pain diary with you.   Expect: (What should I expect to see with my procedure?) From numbing medicine (AKA: Local Anesthetics): Numbness or decrease in pain. You may also experience some weakness, which if present, could last for the duration of the local anesthetic. Onset: Full effect within 15 minutes of injected. Duration: It will depend on the type of local anesthetic used. On the average, 1 to 8 hours.  From steroids (Applies only if steroids were used): Decrease in swelling or inflammation. Once inflammation is improved, relief of the pain will follow. Onset of benefits: Depends on the amount of swelling present. The more swelling, the longer it will take for the benefits to be seen. In some cases, up to 10 days. Duration: Steroids will stay in the system x 2 weeks. Duration of benefits will depend on multiple posibilities including persistent irritating factors. Side-effects: If present, they may typically last 2 weeks (the duration of the steroids). Frequent: Cramps (if they occur, drink Gatorade and take over-the-counter Magnesium 450-500 mg once to twice a day); water retention with temporary weight gain; increases in blood sugar; decreased immune system response; increased appetite. Occasional: Facial flushing (  red, warm cheeks); mood swings; menstrual changes. Uncommon: Long-term decrease or suppression of natural hormones; bone thinning. (These are more common with higher doses or more frequent use. This is why we prefer that our patients avoid having any injection therapies in other practices.)  Very Rare: Severe mood changes; psychosis; aseptic necrosis. From  procedure: Some discomfort is to be expected once the numbing medicine wears off. This should be minimal if ice and heat are applied as instructed.  Call if: (When should I call?) You experience numbness and weakness that gets worse with time, as opposed to wearing off. New onset bowel or bladder incontinence. (Applies only to procedures done in the spine)  Emergency Numbers: Durning business hours (Monday - Thursday, 8:00 AM - 4:00 PM) (Friday, 9:00 AM - 12:00 Noon): [phone] After hours: [phone] NOTE: If you are having a problem and are unable connect with, or to talk to a provider, then go to your nearest urgent care or emergency department. If the problem is serious and urgent, please call 911. ____________________________________________________________________________________________    

## 2022-04-15 ENCOUNTER — Telehealth: Payer: Self-pay

## 2022-04-15 NOTE — Telephone Encounter (Signed)
Post procedure phone. LM

## 2022-04-16 ENCOUNTER — Telehealth: Payer: Self-pay | Admitting: Pain Medicine

## 2022-04-16 ENCOUNTER — Telehealth: Payer: Self-pay | Admitting: *Deleted

## 2022-04-16 NOTE — Telephone Encounter (Signed)
Returned patient phone call, voicemail left with patient that soreness is to be expected.  Diarrhea is not expected usually from procedural medications.  Told her to continue to monitor, also to monitor if she is febrile and not feeling well, she may need to contact her PCP.

## 2022-04-16 NOTE — Telephone Encounter (Signed)
PT were returning call that she missed from nurse. PT stated that she is fine little sore. PT has one concerns that she has been having diarrhea once to know is that normal. Please give patient a call. Thanks

## 2022-04-18 ENCOUNTER — Other Ambulatory Visit: Payer: Self-pay | Admitting: Family

## 2022-04-27 ENCOUNTER — Telehealth: Payer: Self-pay | Admitting: Family

## 2022-04-27 ENCOUNTER — Other Ambulatory Visit: Payer: Self-pay

## 2022-04-27 ENCOUNTER — Other Ambulatory Visit: Payer: Self-pay | Admitting: Family

## 2022-04-27 DIAGNOSIS — G8929 Other chronic pain: Secondary | ICD-10-CM

## 2022-04-27 MED ORDER — OXYCODONE-ACETAMINOPHEN 7.5-325 MG PO TABS: ORAL_TABLET | ORAL | 0 refills | Status: DC

## 2022-04-27 MED ORDER — EMPAGLIFLOZIN 10 MG PO TABS: 10.0000 mg | ORAL_TABLET | Freq: Every day | ORAL | 5 refills | Status: DC

## 2022-04-27 MED ORDER — BUMETANIDE 1 MG PO TABS: 2.0000 mg | ORAL_TABLET | Freq: Every day | ORAL | 3 refills | Status: DC

## 2022-04-27 NOTE — Telephone Encounter (Signed)
Pt advised we send med  

## 2022-04-27 NOTE — Telephone Encounter (Signed)
Patient says that since she has been taking jardiance, she is having very frequent urination sometimes 6-8 times in a couple of hours. Currently taking bumex 2mg  BID. Weight is down to 183 pounds. Denies any swelling or shortness of breath.   Advised patient to decrease her bumex to 2mg  every morning and additional 2mg  PM PRN for weight gain >2 pounds, edema or shortness of breath.   Patient verbalized understanding.

## 2022-04-27 NOTE — Progress Notes (Unsigned)
PROVIDER NOTE: Information contained herein reflects review and annotations entered in association with encounter. Interpretation of such information and data should be left to medically-trained personnel. Information provided to patient can be located elsewhere in the medical record under "Patient Instructions". Document created using STT-dictation technology, any transcriptional errors that may result from process are unintentional.    Patient: [name]  Service Category: E/M  Provider: Gaspar Cola, MD  DOB: Dec 08, 1945  DOS: 04/28/2022  Referring Provider: [name], MD  MRN: [id number]  Specialty: Interventional Pain Management  PCP: [name], MD  Type: Established Patient  Setting: Ambulatory outpatient    Location: Office  Delivery: Face-to-face     HPI  [name], a 76 y.o. year old female, is here today because of her Trigger point with neck pain [M54.2]. [name]'s primary complain today is No chief complaint on file. Last encounter: My last encounter with her was on 04/16/2022. 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; Shoulder impingement syndrome (Left); Chronic left shoulder pain; Chronic pain syndrome; Chronic shoulder pain (1ry area of Pain) (Bilateral) (L>R); Chronic upper extremity pain (2ry area of Pain) (Bilateral) (L>R); Cervicalgia; Chronic neck pain (3ry area of Pain) (Posterior) (Bilateral) (L>R); Shoulder blade pain (4th area of Pain) (Left); Osteoarthritis of glenohumeral joint (Left); Osteoarthritis of AC (acromioclavicular) joint (Left); Osteoarthritis of glenohumeral joints (Bilateral); Osteoarthritis of acromioclavicular joints (Bilateral); Primary osteoarthritis of shoulders (Bilateral); DDD (degenerative disc disease), cervical; Cervical radiculitis (Left); Cervical radiculopathy (Left); C6 radiculopathy (Left); C7 radiculopathy (Left); Unable to maintain body in lying position; Chronic  intractable headache; DISH (diffuse idiopathic skeletal hyperostosis); Abnormal CT scan, cervical spine (03/04/2022); Trigger point of shoulder region (Bilateral); Trigger point with neck pain; Chronic upper back pain; Chronic myofascial pain; and Musculoskeletal disorder involving upper trapezius muscle on their pertinent problem list. Pain Assessment: Severity of   is reported as a  /10. Location:    / . Onset:  . Quality:  . Timing:  . Modifying factor(s):  [name] Vitals:  vitals were not taken for this visit.   Reason for encounter: post-procedure evaluation and assessment. ***  Post-procedure evaluation    Procedure:          Anesthesia, Analgesia, Anxiolysis:  Type: Trapezius and supraspinatus muscle Trigger Point Injection (1-2 muscle groups) #1  CPT: 20552 Primary Purpose: Therapeutic Region: Upper Cervicothoracic and shoulder Level: Cervico-thoracic/shoulder Target Area: Trapezius and supraspinatus muscle Trigger Point Approach: Percutaneous, ipsilateral approach. Laterality: Bilateral        Type: Local Anesthesia Local Anesthetic: Lidocaine 1-2% Sedation: None  Indication(s):  Analgesia Route: Infiltration (Clare/IM) IV Access: N/A   Position: Sitting   1. Trigger point with neck pain   2. Trigger point of shoulder region, unspecified laterality   3. Shoulder blade pain (4th area of Pain) (Left)   4. Chronic shoulder pain (1ry area of Pain) (Bilateral) (L>R)   5. Chronic neck pain (3ry area of Pain) (Posterior) (Bilateral) (L>R)   6. Chronic upper back pain   7. Chronic myofascial pain   8. Musculoskeletal disorder involving upper trapezius muscle   9. Chronic anticoagulation (Eliquis)    NAS-11 Pain score:   Pre-procedure: 0-No pain/10   Post-procedure: 0-No pain/10      Effectiveness:  Initial hour after procedure:   ***. Subsequent 4-6 hours post-procedure:   ***. Analgesia past initial 6 hours:   ***. Ongoing improvement:  Analgesic:  *** Function:    ***  ROM:     ***     Pharmacotherapy Assessment  Analgesic: Oxycodone/APAP 7.5/325, 1 tab p.o. daily (# 45) (last filled on 06/02/2021) by Erich Montane, FNP-C, working for Dr. Talmadge Chad, MD. MME/day: 7.5 mg/day   Monitoring: [place] PMP: PDMP reviewed during this encounter.       Pharmacotherapy: No side-effects or adverse reactions reported. Compliance: No problems identified. Effectiveness: Clinically acceptable.  No notes on file  No results found for: "CBDTHCR" No results found for: "D8THCCBX" No results found for: "D9THCCBX"  UDS:  Summary  Date Value Ref Range Status  04/30/2021 Note  Final    Comment:    ==================================================================== Compliance Drug Analysis, Ur ==================================================================== Test                             Result       Flag       Units  Drug Present and Declared for Prescription Verification   Alpha-hydroxyalprazolam        107          EXPECTED   ng/mg creat    Alpha-hydroxyalprazolam is an expected metabolite of alprazolam.    Source of alprazolam is a scheduled prescription medication.    Oxycodone                      1371         EXPECTED   ng/mg creat   Oxymorphone                    600          EXPECTED   ng/mg creat   Noroxycodone                   2311         EXPECTED   ng/mg creat    Sources of oxycodone include scheduled prescription medications.    Oxymorphone and noroxycodone are expected metabolites of oxycodone.    Oxymorphone is also available as a scheduled prescription medication.    Sertraline                     PRESENT      EXPECTED   Desmethylsertraline            PRESENT      EXPECTED    Desmethylsertraline is an expected metabolite of sertraline.    Trazodone                      PRESENT      EXPECTED   1,3 chlorophenyl piperazine    PRESENT      EXPECTED    1,3-chlorophenyl piperazine is an expected metabolite of trazodone.    Acetaminophen                   PRESENT      EXPECTED  Drug Absent but Declared for Prescription Verification   Diclofenac                     Not Detected UNEXPECTED    Diclofenac, as indicated in the declared medication list, is not    always detected even when used as directed.  ==================================================================== Test                      Result    Flag   Units  Ref Range   Creatinine              45               mg/dL      >=20 ==================================================================== Declared Medications:  The flagging and interpretation on this report are based on the  following declared medications.  Unexpected results may arise from  inaccuracies in the declared medications.   **Note: The testing scope of this panel includes these medications:   Alprazolam (Xanax)  Oxycodone (Percocet)  Sertraline (Zoloft)  Trazodone (Desyrel)   **Note: The testing scope of this panel does not include small to  moderate amounts of these reported medications:   Acetaminophen (Tylenol)  Acetaminophen (Percocet)  Diclofenac (Voltaren)   **Note: The testing scope of this panel does not include the  following reported medications:   Albuterol (Ventolin HFA)  Apixaban (Eliquis)  Atorvastatin (Lipitor)  Bumetanide (Bumex)  Calcium  Flecainide (Tambocor)  Fluticasone (Trelegy)  Iron  Levothyroxine (Synthroid)  Lisinopril (Zestril)  Loratadine (Claritin)  Pantoprazole (Protonix)  Potassium (Klor-Con)  Ubiquinone (CoQ10)  Umeclidinium (Trelegy)  Vilanterol (Trelegy)  Vitamin D ==================================================================== For clinical consultation, please call [phone]. ====================================================================       ROS  Constitutional: Denies any fever or chills Gastrointestinal: No reported hemesis, hematochezia, vomiting, or acute GI distress Musculoskeletal: Denies any acute onset  joint swelling, redness, loss of ROM, or weakness Neurological: No reported episodes of acute onset apraxia, aphasia, dysarthria, agnosia, amnesia, paralysis, loss of coordination, or loss of consciousness  Medication Review  ALPRAZolam, Budeson-Glycopyrrol-Formoterol, Calcium Carbonate-Vitamin D, CoQ10, albuterol, amLODipine, apixaban, atorvastatin, bumetanide, carvedilol, diclofenac Sodium, empagliflozin, ferrous sulfate, flecainide, ipratropium-albuterol, levothyroxine, lisinopril, loratadine, oxyCODONE-acetaminophen, pantoprazole, potassium chloride, sertraline, sodium chloride, and traZODone  History Review  Allergy: [name] is allergic to benadryl [diphenhydramine hcl (sleep)], cetirizine, lasix [furosemide], levaquin [levofloxacin in d5w], meloxicam, soy allergy, and sulfa antibiotics. Drug: [name]  reports no history of drug use. Alcohol:  reports no history of alcohol use. Tobacco:  reports that she has quit smoking. Her smoking use included cigarettes. She has never used smokeless tobacco. Social: [name]  reports that she has quit smoking. Her smoking use included cigarettes. She has never used smokeless tobacco. She reports that she does not drink alcohol and does not use drugs. Medical:  has a past medical history of Aortic stenosis due to bicuspid aortic valve, Aspiration pneumonia ([name]), Atrial fibrillation with RVR ([place]) (01/11/2015), Atrial flutter ([place]), Basal skull fracture ([name]) (20 yrs ago), CHF (congestive heart failure) ([place]), Chronic respiratory failure ([place]), COPD (chronic obstructive pulmonary disease) ([place]), Deafness in left ear, Essential hypertension (01/24/2015), History of cardiac cath, History of stress test, HLD (hyperlipidemia), HTN (hypertension), Hypothyroidism, Obesity, PAF (paroxysmal atrial fibrillation) ([place]), Paroxysmal atrial fibrillation ([place]) (01/24/2015), Right upper quadrant abdominal tenderness without rebound tenderness (03/02/2018), and S/P  ascending aortic aneurysm repair (2008). Surgical: [name]  has a past surgical history that includes Abdominal aortic aneurysm repair (2008); Aortic valve replacement (2008); Abdominal hysterectomy; Carpal tunnel release; Tumor excision (Left); Cardiac catheterization; Cardiac catheterization (N/A, 08/17/2016); CARDIOVERSION (N/A, 08/15/2018); CARDIOVERSION (N/A, 11/14/2018); Cardioversion (N/A, 06/05/2019); Cardioversion (N/A, 08/14/2019); Cardioversion (N/A, 11/09/2019); and Cardioversion (N/A, 01/17/2020). Family: family history includes Stroke in her father and paternal grandmother.  Laboratory Chemistry Profile   Renal Lab Results  Component Value Date   BUN 19 03/20/2022   CREATININE 0.89 03/20/2022   BCR 21 03/20/2022   GFRAA >60 05/14/2020   GFRNONAA >60 01/26/2022    Hepatic Lab Results  Component Value Date   AST 21 03/20/2022   ALT 13 03/20/2022   ALBUMIN 4.2 03/20/2022   ALKPHOS 113 03/20/2022   LIPASE 124 10/16/2011    Electrolytes Lab Results  Component Value Date   NA 140 03/20/2022   K 4.5 03/20/2022   CL 102 03/20/2022   CALCIUM 9.6 03/20/2022   MG 2.0 12/03/2021   PHOS 3.5 01/30/2021    Bone Lab Results  Component Value Date   VD25OH 25.5 (L) 11/25/2020   25OHVITD1 31 04/30/2021   25OHVITD2 <1.0 04/30/2021   25OHVITD3 31 04/30/2021    Inflammation (CRP: Acute Phase) (ESR: Chronic Phase) Lab Results  Component Value Date   CRP 6 04/30/2021   ESRSEDRATE 40 04/30/2021   LATICACIDVEN 1.5 01/25/2022         Note: Above Lab results reviewed.  Recent Imaging Review  CT CERVICAL SPINE WO CONTRAST CLINICAL DATA:  Axial pain, possible radicular component. Chronic pain of both upper extremities. Chronic neck pain. Recent fall.  EXAM: CT CERVICAL SPINE WITHOUT CONTRAST  TECHNIQUE: Multidetector CT imaging of the cervical spine was performed without intravenous contrast. Multiplanar CT image reconstructions were also generated.  RADIATION DOSE  REDUCTION: This exam was performed according to the departmental dose-optimization program which includes automated exposure control, adjustment of the mA and/or kV according to patient size and/or use of iterative reconstruction technique.  COMPARISON:  Radiography 02/25/2019  FINDINGS: Alignment: Straightening of the normal cervical lordosis. No antero or retrolisthesis.  Skull base and vertebrae: No fracture. Prominent bridging osteophytes with solid fusion from C4 through T1, suggesting diffuse idiopathic skeletal hyperostosis.  Soft tissues and spinal canal: Negative  Disc levels: The foramen magnum is widely patent. There is ordinary mild osteoarthritis of the C1-2 articulation but no encroachment upon the neural structures.  C2-3: Prominent osteophytes without definite solid bridging. Mild facet osteoarthritis. No canal or foraminal stenosis.  C3-4: Prominent osteophytes with equivocal solid bridging. Wide patency of the canal and foramina.  C4 through T1: Solid bridging osteophytes. Disc space narrowing throughout the region. Small endplate osteophytes. No compressive canal stenosis. Mild bony foraminal narrowing bilaterally at C4-5 and on the left at C5-6, C6-7 and C7-T1.  Upper chest: Negative  Other: None  IMPRESSION: No apparent compressive stenosis of the canal or foramina. Prominent cervical and upper thoracic region osteophytes with areas of solid bone bridging suggesting diffuse idiopathic skeletal hyperostosis.  Electronically Signed   By: Nelson Chimes M.D.   On: 03/04/2022 15:36 Note: Reviewed        Physical Exam  General appearance: Well nourished, well developed, and well hydrated. In no apparent acute distress Mental status: Alert, oriented x 3 (person, place, & time)       Respiratory: No evidence of acute respiratory distress Eyes: PERLA Vitals: There were no vitals taken for this visit. BMI: Estimated body mass index is 38.46 kg/m as  calculated from the following:   Height as of 04/14/22: 4' 10"  (1.473 m).   Weight as of 04/14/22: 184 lb (83.5 kg). Ideal: Patient must be at least 60 in tall to calculate ideal body weight  Assessment   Diagnosis Status  1. Trigger point with neck pain   2. Trigger point of shoulder region, unspecified laterality   3. Shoulder blade pain (4th area of Pain) (Left)   4. Chronic shoulder pain (1ry area of Pain) (Bilateral) (L>R)   5. Chronic neck pain (3ry area of Pain) (Posterior) (Bilateral) (L>R)   6. Chronic upper back pain  Controlled Controlled Controlled   Updated Problems: No problems updated.  Plan of Care  Problem-specific:  No problem-specific Assessment & Plan notes found for this encounter.  [name] has a current medication list which includes the following long-term medication(s): albuterol, apixaban, atorvastatin, bumetanide, calcium carbonate-vitamin d, carvedilol, ferrous sulfate, ipratropium-albuterol, levothyroxine, lisinopril, pantoprazole, potassium chloride, sertraline, and trazodone.  Pharmacotherapy (Medications Ordered): No orders of the defined types were placed in this encounter.  Orders:  No orders of the defined types were placed in this encounter.  Follow-up plan:   No follow-ups on file.     Interventional Therapies  Risk  Complexity Considerations:   Estimated body mass index is 38.87 kg/m as calculated from the following:   Height as of this encounter: 4' 10"  (1.473 m).   Weight as of this encounter: 186 lb (84.4 kg). 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 DISH: (Left C7-T1 calcified ligamentum flavum) (see 10/04/2021 cervical CT Se: 7 Im: 39/81; Se: 9 Im: 56/88; Se: 11 Im: 76/88; Se: 5 Im: 63/76)   Planned  Pending:    Therapeutic/palliative upper back and shoulder trigger point inj. #1    Under consideration:   Therapeutic/palliative upper back and shoulder trigger point inj.    Completed:   Diagnostic bilateral suprascapular NB x2 (09/25/2021) (100/100/100/100 x 2.5 months)    Therapeutic  Palliative (PRN) options:   Therapeutic/palliative upper back and shoulder trigger point inj.        Recent Visits Date Type Provider Dept  04/14/22 Procedure visit Milinda Pointer, MD [facility]  04/01/22 Office Visit Milinda Pointer, MD [facility]  Showing recent visits within past 90 days and meeting all other requirements Future Appointments Date Type Provider Dept  04/28/22 Appointment Milinda Pointer, MD [facility]  Showing future appointments within next 90 days and meeting all other requirements  I discussed the assessment and treatment plan with the patient. The patient was provided an opportunity to ask questions and all were answered. The patient agreed with the plan and demonstrated an understanding of the instructions.  Patient advised to call back or seek an in-person evaluation if the symptoms or condition worsens.  Duration of encounter: *** minutes.  Total time on encounter, as per AMA guidelines included both the face-to-face and non-face-to-face time personally spent by the physician and/or other qualified health care professional(s) on the day of the encounter (includes time in activities that require the physician or other qualified health care professional and does not include time in activities normally performed by clinical staff). Physician's time may include the following activities when performed: preparing to see the patient (eg, review of tests, pre-charting review of records) obtaining and/or reviewing separately obtained history performing a medically appropriate examination and/or evaluation counseling and educating the  patient/family/caregiver ordering medications, tests, or procedures referring and communicating with other health care professionals (when not separately reported) documenting clinical information in the electronic or other health record independently interpreting results (not separately reported) and communicating results to the patient/ family/caregiver care coordination (not separately reported)  Note by: Gaspar Cola, MD Date: 04/28/2022; Time: 5:12 PM

## 2022-04-28 ENCOUNTER — Ambulatory Visit: Payer: Medicare Other | Admitting: Internal Medicine

## 2022-04-28 ENCOUNTER — Other Ambulatory Visit: Payer: Self-pay

## 2022-04-28 ENCOUNTER — Encounter: Payer: Self-pay | Admitting: Pain Medicine

## 2022-04-28 ENCOUNTER — Ambulatory Visit: Payer: Medicare Other | Attending: Pain Medicine | Admitting: Pain Medicine

## 2022-04-28 VITALS — BP 106/61 | HR 71 | Temp 97.9°F | Resp 18 | Ht <= 58 in | Wt 182.0 lb

## 2022-04-28 DIAGNOSIS — M542 Cervicalgia: Secondary | ICD-10-CM | POA: Insufficient documentation

## 2022-04-28 DIAGNOSIS — M898X1 Other specified disorders of bone, shoulder: Secondary | ICD-10-CM | POA: Diagnosis not present

## 2022-04-28 DIAGNOSIS — M549 Dorsalgia, unspecified: Secondary | ICD-10-CM | POA: Diagnosis not present

## 2022-04-28 DIAGNOSIS — Z7901 Long term (current) use of anticoagulants: Secondary | ICD-10-CM | POA: Insufficient documentation

## 2022-04-28 DIAGNOSIS — M25519 Pain in unspecified shoulder: Secondary | ICD-10-CM | POA: Diagnosis not present

## 2022-04-28 DIAGNOSIS — M25511 Pain in right shoulder: Secondary | ICD-10-CM | POA: Diagnosis not present

## 2022-04-28 DIAGNOSIS — G8929 Other chronic pain: Secondary | ICD-10-CM | POA: Insufficient documentation

## 2022-04-28 DIAGNOSIS — M25512 Pain in left shoulder: Secondary | ICD-10-CM | POA: Diagnosis not present

## 2022-04-28 NOTE — Progress Notes (Signed)
Safety precautions to be maintained throughout the outpatient stay will include: orient to surroundings, keep bed in low position, maintain call bell within reach at all times, provide assistance with transfer out of bed and ambulation.  

## 2022-04-28 NOTE — Patient Instructions (Signed)
____________________________________________________________________________________________  Blood Thinners  IMPORTANT NOTICE:  If you take any of these, make sure to notify the nursing staff.  Failure to do so may result in injury.  Recommended time intervals to stop and restart blood-thinners, before & after invasive procedures  Generic Name Brand Name Pre-procedure. Stop this long before procedure. Post-procedure. Minimum waiting period before restarting.  Abciximab Reopro 15 days 2 hrs  Alteplase Activase 10 days 10 days  Anagrelide Agrylin    Apixaban Eliquis 3 days 6 hrs  Cilostazol Pletal 3 days 5 hrs  Clopidogrel Plavix 7-10 days 2 hrs  Dabigatran Pradaxa 5 days 6 hrs  Dalteparin Fragmin 24 hours 4 hrs  Dipyridamole Aggrenox 11days 2 hrs  Edoxaban Lixiana; Savaysa 3 days 2 hrs  Enoxaparin  Lovenox 24 hours 4 hrs  Eptifibatide Integrillin 8 hours 2 hrs  Fondaparinux  Arixtra 72 hours 12 hrs  Hydroxychloroquine Plaquenil 11 days   Prasugrel Effient 7-10 days 6 hrs  Reteplase Retavase 10 days 10 days  Rivaroxaban Xarelto 3 days 6 hrs  Ticagrelor Brilinta 5-7 days 6 hrs  Ticlopidine Ticlid 10-14 days 2 hrs  Tinzaparin Innohep 24 hours 4 hrs  Tirofiban Aggrastat 8 hours 2 hrs  Warfarin Coumadin 5 days 2 hrs   Other medications with blood-thinning effects  Product indications Generic (Brand) names Note  Cholesterol Lipitor Stop 4 days before procedure  Blood thinner (injectable) Heparin (LMW or LMWH Heparin) Stop 24 hours before procedure  Cancer Ibrutinib (Imbruvica) Stop 7 days before procedure  Malaria/Rheumatoid Hydroxychloroquine (Plaquenil) Stop 11 days before procedure  Thrombolytics  10 days before or after procedures   Over-the-counter (OTC) Products with blood-thinning effects  Product Common names Stop Time  Aspirin > 325 mg Goody Powders, Excedrin, etc. 11 days  Aspirin ? 81 mg  7 days  Fish oil  4 days  Garlic supplements  7 days  Ginkgo biloba  36  hours  Ginseng  24 hours  NSAIDs Ibuprofen, Naprosyn, etc. 3 days  Vitamin E  4 days   ____________________________________________________________________________________________

## 2022-04-30 ENCOUNTER — Ambulatory Visit (INDEPENDENT_AMBULATORY_CARE_PROVIDER_SITE_OTHER): Payer: Medicare Other | Admitting: Internal Medicine

## 2022-04-30 ENCOUNTER — Encounter: Payer: Self-pay | Admitting: Internal Medicine

## 2022-04-30 VITALS — BP 115/67 | HR 70 | Temp 98.5°F | Resp 16 | Ht <= 58 in | Wt 183.8 lb

## 2022-04-30 DIAGNOSIS — J849 Interstitial pulmonary disease, unspecified: Secondary | ICD-10-CM

## 2022-04-30 DIAGNOSIS — J9611 Chronic respiratory failure with hypoxia: Secondary | ICD-10-CM | POA: Diagnosis not present

## 2022-04-30 DIAGNOSIS — I5032 Chronic diastolic (congestive) heart failure: Secondary | ICD-10-CM

## 2022-04-30 DIAGNOSIS — R300 Dysuria: Secondary | ICD-10-CM

## 2022-04-30 LAB — POCT URINALYSIS DIPSTICK
Bilirubin, UA: NEGATIVE
Blood, UA: NEGATIVE
Glucose, UA: NEGATIVE
Ketones, UA: NEGATIVE
Leukocytes, UA: NEGATIVE
Nitrite, UA: NEGATIVE
Protein, UA: NEGATIVE
Spec Grav, UA: 1.01 (ref 1.010–1.025)
Urobilinogen, UA: 0.2 E.U./dL
pH, UA: 5 (ref 5.0–8.0)

## 2022-04-30 NOTE — Progress Notes (Signed)
[facility] [place], [place] [address]  Internal MEDICINE  Office Visit Note  Patient Name: [name]  [id number]  [id number]  Date of Service: 05/09/2022  Chief Complaint  Patient presents with   COPD    Pt is on home O2 and NIV,    Congestive Heart Failure    Diastolic    Hyperlipidemia   Results    Abnormal CT Chest 12/2021, needs monitoring  1. Persistent peripheral consolidation and ground-glass opacities of the right lower lobe with interval resolution of left upper lobe consolidation, findings are likely due to infectious or inflammatory process. Recommend additional follow-up chest CT in 3 months to ensure complete resolution. 2. Stable solid pulmonary nodule the right upper lobe. Recommend attention on follow-up. 3. Aortic Atherosclerosis (ICD10-I70.0) and Emphysema (ICD10-J43.9).    HPI Pt is here for routine follow up, has multiple medical problems  Patient reports to be feeling somewhat better after being on NIV still requires oxygen 4 L /[place] at all times, CT chest was abnormal in 12/25/2021, will need monitoring surveillance as recommended, her recent PFTs showed moderate pulmonary lung disease She is followed by cardiology for problematic paroxysmal atrial fibrillation, status post 5 cardioversions more recently 01/15/2020. The patient underwent successful AV node ablation with dual-chamber pacemaker implantation on 02/07/2020. The patient has history of aortic stenosis, status post AVR and aortic root repair in 2008.  Patient is also on Eliquis for CVA stroke prevention Hypertension is well controlled with the medications Patient is also on atorvastatin Chronic shoulder  pain, moderately controlled by pain medications, generalized anxiety disorder well-controlled  Current Medication: Outpatient Encounter Medications as of 04/30/2022  Medication Sig   albuterol (VENTOLIN HFA) 108 (90 Base) MCG/ACT inhaler Inhale 2 puffs into the lungs every 6  (six) hours as needed for wheezing or shortness of breath.   ALPRAZolam (XANAX) 0.25 MG tablet Take 1 tablet (0.25 mg total) by mouth 2 (two) times daily as needed for anxiety.   amLODipine (NORVASC) 10 MG tablet Take 1 tablet by mouth daily.   apixaban (ELIQUIS) 5 MG TABS tablet Take 1 tablet (5 mg total) by mouth 2 (two) times daily.   atorvastatin (LIPITOR) 40 MG tablet TAKE 1 TABLET BY MOUTH EVERY NIGHT AT BEDTIME   Budeson-Glycopyrrol-Formoterol (BREZTRI AEROSPHERE) 160-9-4.8 MCG/ACT AERO Inhale 2 puffs into the lungs 2 (two) times daily.   bumetanide (BUMEX) 1 MG tablet Take 2 tablets (2 mg total) by mouth daily. And additional 2 tablets PM PRN   Calcium Carbonate-Vitamin D (CALCIUM 600+D PO) Take 1 tablet by mouth daily.   carvedilol (COREG) 6.25 MG tablet Take 1 tablet (6.25 mg total) by mouth 2 (two) times daily with a meal.   Coenzyme Q10 (COQ10) 200 MG CAPS Take 200 mg by mouth daily.   diclofenac Sodium (VOLTAREN) 1 % GEL SMARTSIG:Gram(s) Topical Twice Daily   empagliflozin (JARDIANCE) 10 MG TABS tablet Take 1 tablet (10 mg total) by mouth daily before breakfast.   ferrous sulfate 325 (65 FE) MG tablet Take 1 tablet (325 mg total) by mouth daily with breakfast.   flecainide (TAMBOCOR) 50 MG tablet Take 50 mg by mouth daily.   ipratropium-albuterol (DUONEB) 0.5-2.5 (3) MG/3ML SOLN Inhale 3 mLs into the lungs every 4 (four) hours as needed. Inhale 3 mls into the lungs every 4 to 6 hours and as needed   levothyroxine (SYNTHROID) 25 MCG tablet TAKE 1 TABLET BY MOUTH DAILY BEFORE BREAKFAST   lisinopril (ZESTRIL) 40 MG tablet  TAKE 1 TABLET(40 MG) BY MOUTH DAILY(DOSE INCREASE)   loratadine (CLARITIN) 10 MG tablet Take 10 mg by mouth daily.   oxyCODONE-acetaminophen (PERCOCET) 7.5-325 MG tablet Take one tab po bid for pain severe   pantoprazole (PROTONIX) 40 MG tablet TAKE 1 TABLET BY MOUTH EVERY DAY   potassium chloride (KLOR-CON) 10 MEQ tablet Take 4 tablets (40 mEq total) by mouth 2 (two)  times daily.   sertraline (ZOLOFT) 100 MG tablet Take one tab a day for Anxiety   sodium chloride (OCEAN) 0.65 % SOLN nasal spray Place 1 spray into both nostrils as needed for congestion.   traZODone (DESYREL) 50 MG tablet Take 1 tablet (50 mg total) by mouth at bedtime.   No facility-administered encounter medications on file as of 04/30/2022.    Surgical History: Past Surgical History:  Procedure Laterality Date   ABDOMINAL AORTIC ANEURYSM REPAIR  2008   ABDOMINAL HYSTERECTOMY     AORTIC VALVE REPLACEMENT  2008   CARDIAC CATHETERIZATION     [place]   CARDIOVERSION N/A 08/15/2018   Procedure: CARDIOVERSION;  Surgeon: [name], MD;  Location: [facility] ORS;  Service: Cardiovascular;  Laterality: N/A;   CARDIOVERSION N/A 11/14/2018   Procedure: CARDIOVERSION (CATH LAB);  Surgeon: [name], MD;  Location: [facility] OR[facility];  Service: Cardiovascular;  Laterality: N/A;   CARDIOVERSION N/A 06/05/2019   Procedure: CARDIOVERSION;  Surgeon: [name], MD;  Location: [facility] ORS;  Service: Cardiovascular;  Laterality: N/A;   CARDIOVERSION N/A 08/14/2019   Procedure: CARDIOVERSION;  Surgeon: [name], MD;  Location: [place] [facility];  Service: Cardiovascular;  Laterality: N/A;   CARDIOVERSION N/A 11/09/2019   Procedure: CARDIOVERSION;  Surgeon: Isaias Cowman, MD;  Location: [facility] ORS;  Service: Cardiovascular;  Laterality: N/A;   CARDIOVERSION N/A 01/17/2020   Procedure: CARDIOVERSION;  Surgeon: Isaias Cowman, MD;  Location: [facility] ORS;  Service: Cardiovascular;  Laterality: N/A;   CARPAL TUNNEL RELEASE     ELECTROPHYSIOLOGIC STUDY N/A 08/17/2016   Procedure: Cardioversion;  Surgeon: [name], MD;  Location: [facility] ORS;  Service: Cardiovascular;  Laterality: N/A;   TUMOR EXCISION Left    x3 (arm)    Medical History: Past Medical History:  Diagnosis Date   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 s/p bioprosthetic valve replacement 2008 at [facility];  b. 01/2015 Echo:  EF 60-65%, no rwma, Gr1 DD, mildly dil LA, nl RV fxn.   Aspiration pneumonia ([place])    Atrial fibrillation with RVR ([place]) 01/11/2015   Atrial flutter ([place])    a. 08/2016 s/p DCCV.  Remains on flecainide 50 mg bid.   Basal skull fracture ([name]) 20 yrs ago   CHF (congestive heart failure) ([name])    Chronic respiratory failure ([name])    COPD (chronic obstructive pulmonary disease) ([place])    a. 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 2008 prior to Aortic aneurysm repair-->nl cors.   History of stress test    a. 10/2015 MV: no ischemia/infarct.   HLD (hyperlipidemia)    HTN (hypertension)    Hypothyroidism    Obesity    PAF (paroxysmal atrial fibrillation) ([name])    a. on Eliquis; b. CHADS2VASc = 3 (HTN, age x 73, female).   Paroxysmal atrial fibrillation ([name]) 01/24/2015   Right upper quadrant abdominal tenderness without rebound  tenderness 03/02/2018   S/P ascending aortic aneurysm repair 2008    Family History: Family History  Problem Relation Age of Onset   Stroke Father    Stroke Paternal Grandmother     Social History   Socioeconomic History   Marital status: Single    Spouse name: Not on file   Number of children: Not on file   Years of education: Not on file   Highest education level: Not on file  Occupational History   Not on file  Tobacco Use   Smoking status: Former    Types: Cigarettes   Smokeless tobacco: Never  Vaping Use   Vaping Use: Never used  Substance and Sexual Activity   Alcohol use: No   Drug use: No   Sexual activity: Never    Birth control/protection: Surgical  Other Topics Concern   Not on file  Social History Narrative   Not on file   Social Determinants of Health   Financial Resource Strain: Low Risk  (08/04/2021)   Overall Financial Resource Strain (CARDIA)    Difficulty of Paying Living Expenses: Not very hard  Food  Insecurity: Not on file  Transportation Needs: Not on file  Physical Activity: Not on file  Stress: Not on file  Social Connections: Not on file  Intimate Partner Violence: Not on file      Review of Systems  Constitutional:  Negative for chills, fatigue and unexpected weight change.  HENT:  Positive for postnasal drip. Negative for congestion, rhinorrhea, sneezing and sore throat.   Eyes:  Negative for redness.  Respiratory:  Negative for cough, chest tightness and shortness of breath.   Cardiovascular:  Negative for chest pain and palpitations.  Gastrointestinal:  Negative for abdominal pain, constipation, diarrhea, nausea and vomiting.  Genitourinary:  Negative for dysuria and frequency.  Musculoskeletal:  Negative for arthralgias, back pain, joint swelling and neck pain.  Skin:  Negative for rash.  Neurological: Negative.  Negative for tremors and numbness.  Hematological:  Negative for adenopathy. Does not bruise/bleed easily.  Psychiatric/Behavioral:  Negative for behavioral problems (Depression), sleep disturbance and suicidal ideas. The patient is not nervous/anxious.     Vital Signs: BP 115/67   Pulse 70   Temp 98.5 F (36.9 C)   Resp 16   Ht 4\' 10"  (1.473 m)   Wt 183 lb 12.8 oz (83.4 kg)   SpO2 94%   BMI 38.41 kg/m    Physical Exam Constitutional:      Appearance: Normal appearance.  HENT:     Head: Normocephalic and atraumatic.     Nose: Nose normal.     Mouth/Throat:     Mouth: Mucous membranes are moist.     Pharynx: No posterior oropharyngeal erythema.  Eyes:     Extraocular Movements: Extraocular movements intact.     Pupils: Pupils are equal, round, and reactive to light.  Cardiovascular:     Pulses: Normal pulses.     Heart sounds: Normal heart sounds.  Pulmonary:     Effort: Pulmonary effort is normal.     Breath sounds: Normal breath sounds.  Neurological:     General: No focal deficit present.     Mental Status: She is alert.   Psychiatric:        Mood and Affect: Mood normal.        Behavior: Behavior normal.        Assessment/Plan: 1. ILD (interstitial lung disease) ([place]) Patient needs monitoring surveillance on her abnormal CT chest  will need high-resolution CT we will order 1 and will need to see pulmonary as well - CT Chest High Resolution; Future  2. Chronic respiratory failure with hypoxia ([name]) Patient is to continue her noninvasive ventilation, along with 4 L oxygen with ambulation - CT Chest High Resolution; Future  3. Dysuria Urinary symptoms specimen is taken to be sent to the lab - POCT Urinalysis Dipstick  4. Chronic diastolic heart failure [facility]) Patient is followed by cardiology and also at the heart failure clinic  General Counseling: Royetta verbalizes understanding of the findings of todays visit and agrees with plan of treatment. I have discussed any further diagnostic evaluation that may be needed or ordered today. We also reviewed her medications today. she has been encouraged to call the office with any questions or concerns that should arise related to todays visit.    Orders Placed This Encounter  Procedures   CT Chest High Resolution   POCT Urinalysis Dipstick    No orders of the defined types were placed in this encounter.   Total time spent:35 Minutes Time spent includes review of chart, medications, test results, and follow up plan with the patient.   [place] Controlled Substance Database was reviewed by me.   Dr [name] Internal medicine

## 2022-05-01 ENCOUNTER — Telehealth: Payer: Self-pay

## 2022-05-02 DIAGNOSIS — G4733 Obstructive sleep apnea (adult) (pediatric): Secondary | ICD-10-CM | POA: Diagnosis not present

## 2022-05-04 DIAGNOSIS — I480 Paroxysmal atrial fibrillation: Secondary | ICD-10-CM | POA: Diagnosis not present

## 2022-05-04 DIAGNOSIS — Q231 Congenital insufficiency of aortic valve: Secondary | ICD-10-CM | POA: Diagnosis not present

## 2022-05-04 DIAGNOSIS — I10 Essential (primary) hypertension: Secondary | ICD-10-CM | POA: Diagnosis not present

## 2022-05-04 DIAGNOSIS — I4891 Unspecified atrial fibrillation: Secondary | ICD-10-CM | POA: Diagnosis not present

## 2022-05-05 ENCOUNTER — Other Ambulatory Visit
Admission: RE | Admit: 2022-05-05 | Discharge: 2022-05-05 | Disposition: A | Discharge status: Home / Self Care | Payer: Medicare Other | Source: Ambulatory Visit | Attending: Family | Admitting: Family

## 2022-05-05 ENCOUNTER — Encounter: Payer: Self-pay | Admitting: Family

## 2022-05-05 ENCOUNTER — Ambulatory Visit: Payer: Medicare Other | Attending: Family | Admitting: Family

## 2022-05-05 VITALS — BP 116/62 | HR 70 | Resp 16 | Ht <= 58 in | Wt 184.0 lb

## 2022-05-05 DIAGNOSIS — E785 Hyperlipidemia, unspecified: Secondary | ICD-10-CM | POA: Diagnosis not present

## 2022-05-05 DIAGNOSIS — I509 Heart failure, unspecified: Secondary | ICD-10-CM | POA: Insufficient documentation

## 2022-05-05 DIAGNOSIS — Z87891 Personal history of nicotine dependence: Secondary | ICD-10-CM | POA: Insufficient documentation

## 2022-05-05 DIAGNOSIS — I10 Essential (primary) hypertension: Secondary | ICD-10-CM

## 2022-05-05 DIAGNOSIS — H9192 Unspecified hearing loss, left ear: Secondary | ICD-10-CM | POA: Diagnosis not present

## 2022-05-05 DIAGNOSIS — Z7901 Long term (current) use of anticoagulants: Secondary | ICD-10-CM | POA: Diagnosis not present

## 2022-05-05 DIAGNOSIS — E079 Disorder of thyroid, unspecified: Secondary | ICD-10-CM | POA: Insufficient documentation

## 2022-05-05 DIAGNOSIS — I480 Paroxysmal atrial fibrillation: Secondary | ICD-10-CM

## 2022-05-05 DIAGNOSIS — J449 Chronic obstructive pulmonary disease, unspecified: Secondary | ICD-10-CM | POA: Insufficient documentation

## 2022-05-05 DIAGNOSIS — I110 Hypertensive heart disease with heart failure: Secondary | ICD-10-CM | POA: Diagnosis not present

## 2022-05-05 DIAGNOSIS — I5032 Chronic diastolic (congestive) heart failure: Secondary | ICD-10-CM | POA: Diagnosis not present

## 2022-05-05 DIAGNOSIS — I5022 Chronic systolic (congestive) heart failure: Secondary | ICD-10-CM | POA: Insufficient documentation

## 2022-05-05 LAB — BASIC METABOLIC PANEL
Anion gap: 9 (ref 5–15)
BUN: 22 mg/dL (ref 8–23)
CO2: 29 mmol/L (ref 22–32)
Calcium: 9.2 mg/dL (ref 8.9–10.3)
Chloride: 102 mmol/L (ref 98–111)
Creatinine, Ser: 0.92 mg/dL (ref 0.44–1.00)
GFR, Estimated: >60 mL/min (ref >60)
Glucose, Bld: 83 mg/dL (ref 70–99)
Potassium: 3.6 mmol/L (ref 3.5–5.1)
Sodium: 140 mmol/L (ref 135–145)

## 2022-05-05 NOTE — Patient Instructions (Addendum)
Continue weighing daily and call for an overnight weight gain of 3 pounds or more or a weekly weight gain of more than 5 pounds.   If you have voicemail, please make sure your mailbox is cleaned out so that we may leave a message and please make sure to listen to any voicemails.    Hold jardiance for a few days and see if back pain improves.

## 2022-05-05 NOTE — Progress Notes (Signed)
Patient ID: [name], female    DOB: 1946-08-22, 76 y.o.   MRN: [id number]  HPI  [name] is a 76 y/o female with a history of hyperlipidemia, HTN, thyroid disease, COPD, left ear deafness, atrial fibrillation, previous tobacco use and chronic heart failure.   Echo report from 11/11/21 reviewed and showed an EF of 60-65%. Echo report from 05/16/21 reviewed and showed an EF of 45% along with mild LVH. Echo report from 02/07/20 reviewed and showed an EF of >55% along with severe LAE. Catheterization done 02/07/20 but unable to view the results.   Admitted 01/25/22 due to pneumonia where she was treated with antibiotics and discharged after 2 days.   She presents today for a follow-up visit with a chief complaint of minimal fatigue with moderate exertion. Describes this as chronic in nature. She has associated shortness of breath, intermittent chest pain, chronic pain, difficulty sleeping and light-headedness along with this. She denies any abdominal distention, palpitations, pedal edema, cough or weight gain.   She says that she's had pain over her right lower back "over my kidney" every since she started taking jardiance. Says that her PCP checked a urine specimen and didn't have a UTI.   Reports a lot of financial stress because some of her medications are very expensive as she's currently in the doughnut hole.   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 s/p bioprosthetic valve replacement 2008 at [facility];  b. 01/2015 Echo: EF 60-65%, no rwma, Gr1 DD, mildly dil LA, nl RV fxn.   Aspiration pneumonia ([place])    Atrial fibrillation with RVR ([place]) 01/11/2015   Atrial flutter ([place])    a. 08/2016 s/p DCCV.  Remains on flecainide 50 mg bid.   Basal skull fracture ([name]) 20 yrs ago   CHF (congestive heart failure) ([name])    Chronic respiratory failure ([name])    COPD (chronic obstructive pulmonary disease) ([place])    a. 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 2008 prior to Aortic aneurysm repair-->nl cors.   History of stress test    a. 10/2015 MV: no ischemia/infarct.   HLD (hyperlipidemia)    HTN (hypertension)    Hypothyroidism    Obesity    PAF (paroxysmal atrial fibrillation) ([name])    a. on Eliquis; b. CHADS2VASc = 3 (HTN, age x 80, female).   Paroxysmal atrial fibrillation ([name]) 01/24/2015   Right upper quadrant abdominal tenderness without rebound tenderness 03/02/2018   S/P ascending aortic aneurysm repair 2008   Past Surgical History:  Procedure Laterality Date   ABDOMINAL AORTIC ANEURYSM REPAIR  2008   ABDOMINAL HYSTERECTOMY     AORTIC VALVE REPLACEMENT  2008   CARDIAC CATHETERIZATION     [facility]   CARDIOVERSION N/A 08/15/2018   Procedure: CARDIOVERSION;  Surgeon: [name], MD;  Location: [facility] OR[facility];  Service: Cardiovascular;  Laterality: N/A;   CARDIOVERSION N/A 11/14/2018   Procedure: CARDIOVERSION (CATH LAB);  Surgeon: [name], MD;  Location: [place] [facility];  Service: Cardiovascular;  Laterality: N/A;   CARDIOVERSION N/A 06/05/2019   Procedure: CARDIOVERSION;  Surgeon: [name], MD;  Location: [place] [facility];  Service: Cardiovascular;  Laterality: N/A;   CARDIOVERSION N/A 08/14/2019   Procedure: CARDIOVERSION;  Surgeon: [name]  A, MD;  Location: [place] [facility];  Service: Cardiovascular;  Laterality: N/A;   CARDIOVERSION N/A 11/09/2019   Procedure: CARDIOVERSION;  Surgeon: Isaias Cowman, MD;  Location: [facility];  Service: Cardiovascular;  Laterality: N/A;   CARDIOVERSION N/A 01/17/2020   Procedure: CARDIOVERSION;  Surgeon: Isaias Cowman, MD;  Location: [facility];  Service: Cardiovascular;  Laterality: N/A;   CARPAL TUNNEL RELEASE     ELECTROPHYSIOLOGIC STUDY N/A 08/17/2016   Procedure: Cardioversion;  Surgeon: [name], MD;  Location: [facility];  Service: Cardiovascular;  Laterality:  N/A;   TUMOR EXCISION Left    x3 (arm)   Family History  Problem Relation Age of Onset   Stroke Father    Stroke Paternal Grandmother    Social History   Tobacco Use   Smoking status: Former    Types: Cigarettes   Smokeless tobacco: Never  Substance Use Topics   Alcohol use: No   Allergies  Allergen Reactions   Benadryl [Diphenhydramine Hcl (Sleep)] Palpitations   Cetirizine Palpitations   Lasix [Furosemide] Rash   Levaquin [Levofloxacin In D5w] Other (See Comments)    Reaction:  Fatigue and muscle soreness   Meloxicam Rash   Soy Allergy Hives and Nausea And Vomiting   Sulfa Antibiotics Rash   Prior to Admission medications   Medication Sig Start Date End Date Taking? Authorizing Provider  albuterol (VENTOLIN HFA) 108 (90 Base) MCG/ACT inhaler Inhale 2 puffs into the lungs every 6 (six) hours as needed for wheezing or shortness of breath. 10/27/21  Yes Merlyn Lot, MD  ALPRAZolam Duanne Moron) 0.25 MG tablet Take 1 tablet (0.25 mg total) by mouth 2 (two) times daily as needed for anxiety. 02/03/22  Yes [name], MD  amLODipine (NORVASC) 10 MG tablet Take 1 tablet by mouth daily. 07/08/21 07/08/22 Yes [provider]  apixaban (ELIQUIS) 5 MG TABS tablet Take 1 tablet (5 mg total) by mouth 2 (two) times daily. 11/04/21  Yes [name], MD  atorvastatin (LIPITOR) 40 MG tablet TAKE 1 TABLET BY MOUTH EVERY NIGHT AT BEDTIME 03/26/22  Yes [name], MD  Budeson-Glycopyrrol-Formoterol (BREZTRI AEROSPHERE) 160-9-4.8 MCG/ACT AERO Inhale 2 puffs into the lungs 2 (two) times daily. 01/22/22  Yes Abernathy, [name], NP  bumetanide (BUMEX) 1 MG tablet Take 2 tablets (2 mg total) by mouth daily. And additional 2 tablets PM PRN 04/27/22  Yes Darylene Price A, FNP  Calcium Carbonate-Vitamin D (CALCIUM 600+D PO) Take 1 tablet by mouth daily.   Yes [provider]  carvedilol (COREG) 6.25 MG tablet Take 1 tablet (6.25 mg total) by mouth 2 (two) times daily with a meal. 12/03/21   Yes Dwyane Dee, MD  Coenzyme Q10 (COQ10) 200 MG CAPS Take 200 mg by mouth daily.   Yes [provider]  diclofenac Sodium (VOLTAREN) 1 % GEL SMARTSIG:Gram(s) Topical Twice Daily 09/09/20  Yes [provider]  empagliflozin (JARDIANCE) 10 MG TABS tablet Take 1 tablet (10 mg total) by mouth daily before breakfast. 04/27/22  Yes Darylene Price A, FNP  ferrous sulfate 325 (65 FE) MG tablet Take 1 tablet (325 mg total) by mouth daily with breakfast. 12/03/21  Yes Dwyane Dee, MD  flecainide (TAMBOCOR) 50 MG tablet Take 50 mg by mouth daily. 12/25/21  Yes [provider]  ipratropium-albuterol (DUONEB) 0.5-2.5 (3) MG/3ML SOLN Inhale 3 mLs into the lungs every 4 (four) hours as needed. Inhale 3 mls into the lungs every 4 to 6 hours and as needed 01/29/22  Yes Humphrey Rolls, [place]  A, MD  levothyroxine (SYNTHROID) 25 MCG tablet TAKE 1 TABLET BY MOUTH DAILY BEFORE BREAKFAST 03/22/22  Yes [name], MD  lisinopril (ZESTRIL) 40 MG tablet TAKE 1 TABLET(40 MG) BY MOUTH DAILY(DOSE INCREASE) 09/29/21  Yes Darylene Price A, FNP  loratadine (CLARITIN) 10 MG tablet Take 10 mg by mouth daily.   Yes [provider]  oxyCODONE-acetaminophen (PERCOCET) 7.5-325 MG tablet Take one tab po bid for pain severe 04/27/22  Yes [name], MD  pantoprazole (PROTONIX) 40 MG tablet TAKE 1 TABLET BY MOUTH EVERY DAY 03/26/22  Yes [name], MD  potassium chloride (KLOR-CON) 10 MEQ tablet Take 4 tablets (40 mEq total) by mouth 2 (two) times daily. 10/06/21  Yes Abernathy, Yetta Flock, NP  sertraline (ZOLOFT) 100 MG tablet Take one tab a day for Anxiety 11/04/21  Yes [name], MD  sodium chloride (OCEAN) 0.65 % SOLN nasal spray Place 1 spray into both nostrils as needed for congestion.   Yes [provider]  traZODone (DESYREL) 50 MG tablet Take 1 tablet (50 mg total) by mouth at bedtime. 04/03/22  Yes [name], MD   Review of Systems  Constitutional:  Positive for appetite change  (decreased) and fatigue.  Eyes: Negative.   Respiratory:  Positive for shortness of breath.   Cardiovascular:  Positive for chest pain (intermittent). Negative for palpitations and leg swelling.  Gastrointestinal:  Negative for abdominal distention and abdominal pain.  Endocrine: Negative.   Genitourinary: Negative.   Musculoskeletal:  Positive for arthralgias (bilateral shoulder pain) and back pain (right lower back).  Skin: Negative.   Allergic/Immunologic: Negative.   Neurological:  Positive for light-headedness (at times). Negative for dizziness.  Hematological:  Negative for adenopathy. Does not bruise/bleed easily.  Psychiatric/Behavioral:  Positive for sleep disturbance (trouble sleeping due to shoulder pain; wearing cpap at bedtime). Negative for dysphoric mood. The patient is not nervous/anxious.     Vitals:   05/05/22 1402  BP: 116/62  Pulse: 70  Resp: 16  SpO2: 96%  Weight: 184 lb (83.5 kg)  Height: 4\' 10"  (1.473 m)   Wt Readings from Last 3 Encounters:  05/05/22 184 lb (83.5 kg)  04/30/22 183 lb 12.8 oz (83.4 kg)  04/28/22 182 lb (82.6 kg)   Lab Results  Component Value Date   CREATININE 0.89 03/20/2022   CREATININE 0.74 01/26/2022   CREATININE 0.85 01/25/2022   Physical Exam Vitals and nursing note reviewed.  Constitutional:      Appearance: Normal appearance.  HENT:     Head: Normocephalic and atraumatic.     Left Ear: Decreased hearing noted.  Cardiovascular:     Rate and Rhythm: Normal rate and regular rhythm.  Pulmonary:     Effort: Pulmonary effort is normal. No tachypnea or respiratory distress.     Breath sounds: No wheezing or rales.  Abdominal:     General: There is no distension.     Palpations: Abdomen is soft.  Musculoskeletal:     Cervical back: Normal range of motion and neck supple.     Right lower leg: No edema.     Left lower leg: No edema.  Skin:    General: Skin is warm and dry.  Neurological:     General: No focal deficit  present.     Mental Status: She is alert and oriented to person, place, and time.  Psychiatric:        Mood and Affect: Mood is not anxious.  Behavior: Behavior normal.        Thought Content: Thought content normal.    Assessment & Plan:  1: Chronic heart failure with now preserved ejection fraction- - NYHA class II - euvolemic today - weighing daily; reminded to call for an overnight weight gain of >2 pounds or a weekly weight gain of >5 pounds - weight down 3 pounds from last visit here 1 month ago - not adding salt and is using pepper for seasoning; reviewed the importance of closely following a low sodium diet  - had telemedicine visit with cardiothoracic provider ([place]) 07/23/21 for aortic valve - on GDMT of lisinopril & carvedilol & jardiance - check BMP today since jardiance was added at last visit - discussed her back pain since jardiance started; advised her to hold the jardiance for a few days and if symptoms improve, resume jardiance to see if back pain returns - if after stopping jardiance, back pain continues, she is to resume jardiance - given patient assistance forms for jardiance and eliquis and patient says she will return the forms at a later date - has had previous valve surgery  - BNP 01/25/22 was 127.6  2: HTN- - BP looks good (116/62) - saw PCP Stephanie Coup) 04/30/22 - BMP 03/20/22 reviewed and showed sodium 140, potassium 4.5, creatinine 0.89 and GFR 68  3: severe COPD- - wearing oxygen at 4L around the clock at home; when she comes to appointments, she has to put her portable tank on 3L/ this tank is pulse and she doesn't like it as much as her continuous oxygen at home - pulse ox on 3L was 96% - saw pulmonology Humphrey Rolls) 02/17/22  4: Atrial fibrillation-  - dual chamber pacemaker placed June 2021 - has had multiple cardioversions - saw cardiology ([place]) 05/04/22   Patient did not bring her medications nor a list. Each medication was verbally  reviewed with the patient and she was encouraged to bring the bottles to every visit to confirm accuracy of list.   Return in 2 months, sooner if needed.

## 2022-05-07 ENCOUNTER — Telehealth: Payer: Self-pay

## 2022-05-07 NOTE — Telephone Encounter (Signed)
Lvm for patient to return cal so I can let her know when CT is scheduled.[name]

## 2022-05-08 NOTE — Telephone Encounter (Signed)
Error

## 2022-05-19 ENCOUNTER — Telehealth: Payer: Self-pay | Admitting: Family

## 2022-05-19 NOTE — Telephone Encounter (Signed)
Notified patient that she has been denied for jardiance patient assistance due to having private drug insurance.   Evann Koelzer, NT

## 2022-05-28 ENCOUNTER — Other Ambulatory Visit: Payer: Self-pay | Admitting: Internal Medicine

## 2022-05-28 ENCOUNTER — Telehealth: Payer: Self-pay

## 2022-05-28 DIAGNOSIS — G8929 Other chronic pain: Secondary | ICD-10-CM

## 2022-05-28 MED ORDER — OXYCODONE-ACETAMINOPHEN 7.5-325 MG PO TABS: ORAL_TABLET | ORAL | 0 refills | Status: DC

## 2022-05-28 NOTE — Telephone Encounter (Signed)
Pt notified we send med

## 2022-05-28 NOTE — Telephone Encounter (Signed)
sent 

## 2022-06-02 ENCOUNTER — Ambulatory Visit
Admission: RE | Admit: 2022-06-02 | Discharge: 2022-06-02 | Disposition: A | Discharge status: Home / Self Care | Payer: Medicare Other | Source: Ambulatory Visit | Attending: Internal Medicine | Admitting: Internal Medicine

## 2022-06-02 DIAGNOSIS — J849 Interstitial pulmonary disease, unspecified: Secondary | ICD-10-CM | POA: Diagnosis not present

## 2022-06-02 DIAGNOSIS — J9611 Chronic respiratory failure with hypoxia: Secondary | ICD-10-CM | POA: Diagnosis not present

## 2022-06-02 DIAGNOSIS — J9819 Other pulmonary collapse: Secondary | ICD-10-CM | POA: Diagnosis not present

## 2022-06-02 DIAGNOSIS — J479 Bronchiectasis, uncomplicated: Secondary | ICD-10-CM | POA: Diagnosis not present

## 2022-06-02 DIAGNOSIS — J84112 Idiopathic pulmonary fibrosis: Secondary | ICD-10-CM | POA: Diagnosis not present

## 2022-06-02 DIAGNOSIS — G4733 Obstructive sleep apnea (adult) (pediatric): Secondary | ICD-10-CM | POA: Diagnosis not present

## 2022-06-02 DIAGNOSIS — J432 Centrilobular emphysema: Secondary | ICD-10-CM | POA: Diagnosis not present

## 2022-06-03 NOTE — Progress Notes (Signed)
Please cancel her app with Lauren 10/9 and schedule with Dr Laurelyn Sickle on 10/5 to discuss CT chest findings

## 2022-06-04 ENCOUNTER — Telehealth: Payer: Self-pay | Admitting: Internal Medicine

## 2022-06-04 ENCOUNTER — Other Ambulatory Visit: Payer: Self-pay | Admitting: Internal Medicine

## 2022-06-04 DIAGNOSIS — F411 Generalized anxiety disorder: Secondary | ICD-10-CM

## 2022-06-04 NOTE — Telephone Encounter (Signed)
Lvm to move 06/15/22 appointment to 06/11/22 w/ [id number]-Toni

## 2022-06-10 DIAGNOSIS — H2513 Age-related nuclear cataract, bilateral: Secondary | ICD-10-CM | POA: Diagnosis not present

## 2022-06-11 ENCOUNTER — Other Ambulatory Visit: Payer: Self-pay | Admitting: Nurse Practitioner

## 2022-06-11 ENCOUNTER — Ambulatory Visit (INDEPENDENT_AMBULATORY_CARE_PROVIDER_SITE_OTHER): Payer: Medicare Other | Admitting: Internal Medicine

## 2022-06-11 ENCOUNTER — Encounter: Payer: Self-pay | Admitting: Internal Medicine

## 2022-06-11 VITALS — BP 112/60 | HR 71 | Temp 98.3°F | Resp 16 | Ht <= 58 in | Wt 188.2 lb

## 2022-06-11 DIAGNOSIS — I5032 Chronic diastolic (congestive) heart failure: Secondary | ICD-10-CM

## 2022-06-11 DIAGNOSIS — R911 Solitary pulmonary nodule: Secondary | ICD-10-CM

## 2022-06-11 DIAGNOSIS — J849 Interstitial pulmonary disease, unspecified: Secondary | ICD-10-CM | POA: Diagnosis not present

## 2022-06-11 DIAGNOSIS — Z9981 Dependence on supplemental oxygen: Secondary | ICD-10-CM

## 2022-06-11 DIAGNOSIS — J9611 Chronic respiratory failure with hypoxia: Secondary | ICD-10-CM

## 2022-06-11 DIAGNOSIS — Z23 Encounter for immunization: Secondary | ICD-10-CM

## 2022-06-11 DIAGNOSIS — J449 Chronic obstructive pulmonary disease, unspecified: Secondary | ICD-10-CM

## 2022-06-11 NOTE — Patient Instructions (Signed)
Pulmonary Nodule  A pulmonary nodule is a small, round growth of tissue in the lung. A nodule may be cancer, but most nodules are not cancer. What are the causes? Infection from a germ (bacteria, fungus, or virus), such as tuberculosis. Tissue that is cancer, such as: Cancer in the lung. Cancer that has spread to the lung from another part of the body. A growth of tissue (mass) that is not cancer. Swelling and irritation from conditions such as rheumatoid arthritis. Having blood vessels that are not normal in the lungs. What are the signs or symptoms? Many times, there are no symptoms. If you get symptoms, they normally have another cause, such as infection. How is this treated? Treatment depends on: If your nodule is cancer or if it is not cancer. What your risk of getting cancer is. Some nodules are not cancer. If this is the case for you, you may not need treatment. Your doctor may do tests to watch the nodule for changes. If the nodule is cancer: You will need tests, such as CT and PET scans. You may need treatment. This may include: Surgery. Treatment with high-energy X-rays (radiation therapy). Medicines. Some nodules need to be taken out. You may have a procedure to have the nodule taken out. During the procedure, your doctor will make a cut (incision) into your chest and take out the part of your lung that has the nodule. Follow these instructions at home: Take over-the-counter and prescription medicines only as told by your doctor. Do not smoke or use any products that contain nicotine or tobacco. If you need help quitting, ask your doctor. Keep all follow-up visits. Contact a doctor if: You have pain in your chest, back, or shoulder. You are short of breath or have trouble breathing when you are active. You get a cough. Your voice starts to sound raspy, breathy, or strained (hoarse), and you do not know why. You feel sick or more tired than normal. You do not feel like  eating. You lose weight without trying. You get chills, or you start to sweat a lot during sleep. You need two or more pillows to sleep on at night. You have: A fever and your symptoms get worse all of a sudden. A fever or symptoms for more than 2-3 days. Get help right away if: You cannot catch your breath. You have sudden chest pain. You start making high-pitched whistling sounds when you breathe, most often when you breathe out (you wheeze). You cannot stop coughing. You cough up blood or bloody mucus from your lungs (sputum). You get dizzy or feel like you may faint. These symptoms may represent a serious problem that is an emergency. Do not wait to see if the symptoms will go away. Get medical help right away. Call your local emergency services (911 in the [place] Do not drive yourself to the hospital. Summary A pulmonary nodule is a small, round growth of tissue in the lung. Most of these nodules are not cancer. Common causes of nodules in the lung include infection, swelling and irritation, and growths that are not cancer. Treatment depends on whether the nodule is cancer or is not cancer. Treatment also depends on your risk of getting cancer. If the nodule is cancer, you will need certain tests and treatments as told by your doctor. This information is not intended to replace advice given to you by your health care provider. Make sure you discuss any questions you have with your health care provider. Document  Revised: 03/13/2020 Document Reviewed: 03/13/2020 [employer] Patient Education  [place].

## 2022-06-11 NOTE — Progress Notes (Signed)
[facility] [place], [place] [address]  Pulmonary Sleep Medicine   Office Visit Note  Patient Name: [name] DOB: [birth year] MRN [id number]  Date of Service: 06/11/2022  Complaints/HPI: Multiple medical issues had a CT scan of the chest done as a follow up and was noted to have a nodule. The follow up scan shows now that there has been enlargement of the  nodule. She needs to have a PET scan done to determine if there is malignancy present. She otherwise is doing the same. She continues on oxygen and has her baseline SOB noted  ROS  General: (-) fever, (-) chills, (-) night sweats, (-) weakness Skin: (-) rashes, (-) itching,. Eyes: (-) visual changes, (-) redness, (-) itching. Nose and Sinuses: (-) nasal stuffiness or itchiness, (-) postnasal drip, (-) nosebleeds, (-) sinus trouble. Mouth and Throat: (-) sore throat, (-) hoarseness. Neck: (-) swollen glands, (-) enlarged thyroid, (-) neck pain. Respiratory: - cough, (-) bloody sputum, + shortness of breath, - wheezing. Cardiovascular: - ankle swelling, (-) chest pain. Lymphatic: (-) lymph node enlargement. Neurologic: (-) numbness, (-) tingling. Psychiatric: (-) anxiety, (-) depression   Current Medication: Outpatient Encounter Medications as of 06/11/2022  Medication Sig   albuterol (VENTOLIN HFA) 108 (90 Base) MCG/ACT inhaler Inhale 2 puffs into the lungs every 6 (six) hours as needed for wheezing or shortness of breath.   ALPRAZolam (XANAX) 0.25 MG tablet TAKE 1 TABLET(0.25 MG) BY MOUTH TWICE DAILY AS NEEDED FOR ANXIETY   amLODipine (NORVASC) 10 MG tablet Take 1 tablet by mouth daily.   apixaban (ELIQUIS) 5 MG TABS tablet Take 1 tablet (5 mg total) by mouth 2 (two) times daily.   atorvastatin (LIPITOR) 40 MG tablet TAKE 1 TABLET BY MOUTH EVERY NIGHT AT BEDTIME   Budeson-Glycopyrrol-Formoterol (BREZTRI AEROSPHERE) 160-9-4.8 MCG/ACT AERO Inhale 2 puffs into the lungs 2 (two) times daily.    bumetanide (BUMEX) 1 MG tablet Take 2 tablets (2 mg total) by mouth daily. And additional 2 tablets PM PRN   Calcium Carbonate-Vitamin D (CALCIUM 600+D PO) Take 1 tablet by mouth daily.   carvedilol (COREG) 6.25 MG tablet Take 1 tablet (6.25 mg total) by mouth 2 (two) times daily with a meal.   Coenzyme Q10 (COQ10) 200 MG CAPS Take 200 mg by mouth daily.   diclofenac Sodium (VOLTAREN) 1 % GEL SMARTSIG:Gram(s) Topical Twice Daily   empagliflozin (JARDIANCE) 10 MG TABS tablet Take 1 tablet (10 mg total) by mouth daily before breakfast.   ferrous sulfate 325 (65 FE) MG tablet Take 1 tablet (325 mg total) by mouth daily with breakfast.   flecainide (TAMBOCOR) 50 MG tablet Take 50 mg by mouth daily.   ipratropium-albuterol (DUONEB) 0.5-2.5 (3) MG/3ML SOLN Inhale 3 mLs into the lungs every 4 (four) hours as needed. Inhale 3 mls into the lungs every 4 to 6 hours and as needed   levothyroxine (SYNTHROID) 25 MCG tablet TAKE 1 TABLET BY MOUTH DAILY BEFORE BREAKFAST   lisinopril (ZESTRIL) 40 MG tablet TAKE 1 TABLET(40 MG) BY MOUTH DAILY(DOSE INCREASE)   loratadine (CLARITIN) 10 MG tablet Take 10 mg by mouth daily.   oxyCODONE-acetaminophen (PERCOCET) 7.5-325 MG tablet Take one tab po bid for pain severe   pantoprazole (PROTONIX) 40 MG tablet TAKE 1 TABLET BY MOUTH EVERY DAY   potassium chloride (KLOR-CON) 10 MEQ tablet Take 4 tablets (40 mEq total) by mouth 2 (two) times daily.   sertraline (ZOLOFT) 100 MG tablet Take one tab a day  for Anxiety   sodium chloride (OCEAN) 0.65 % SOLN nasal spray Place 1 spray into both nostrils as needed for congestion.   traZODone (DESYREL) 50 MG tablet Take 1 tablet (50 mg total) by mouth at bedtime.   No facility-administered encounter medications on file as of 06/11/2022.    Surgical History: Past Surgical History:  Procedure Laterality Date   ABDOMINAL AORTIC ANEURYSM REPAIR  2008   ABDOMINAL HYSTERECTOMY     AORTIC VALVE REPLACEMENT  2008   CARDIAC CATHETERIZATION      [place]   CARDIOVERSION N/A 08/15/2018   Procedure: CARDIOVERSION;  Surgeon: [name], MD;  Location: [facility] OR[facility];  Service: Cardiovascular;  Laterality: N/A;   CARDIOVERSION N/A 11/14/2018   Procedure: CARDIOVERSION (CATH LAB);  Surgeon: [name], MD;  Location: [facility];  Service: Cardiovascular;  Laterality: N/A;   CARDIOVERSION N/A 06/05/2019   Procedure: CARDIOVERSION;  Surgeon: [name], MD;  Location: [facility];  Service: Cardiovascular;  Laterality: N/A;   CARDIOVERSION N/A 08/14/2019   Procedure: CARDIOVERSION;  Surgeon: [name], MD;  Location: [facility];  Service: Cardiovascular;  Laterality: N/A;   CARDIOVERSION N/A 11/09/2019   Procedure: CARDIOVERSION;  Surgeon: Isaias Cowman, MD;  Location: [facility];  Service: Cardiovascular;  Laterality: N/A;   CARDIOVERSION N/A 01/17/2020   Procedure: CARDIOVERSION;  Surgeon: Isaias Cowman, MD;  Location: [facility];  Service: Cardiovascular;  Laterality: N/A;   CARPAL TUNNEL RELEASE     ELECTROPHYSIOLOGIC STUDY N/A 08/17/2016   Procedure: Cardioversion;  Surgeon: [name], MD;  Location: [facility];  Service: Cardiovascular;  Laterality: N/A;   TUMOR EXCISION Left    x3 (arm)    Medical History: Past Medical History:  Diagnosis Date   Aortic 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 s/p bioprosthetic valve replacement 2008 at [facility];  b. 01/2015 Echo: EF 60-65%, no rwma, Gr1 DD, mildly dil LA, nl RV fxn.   Aspiration pneumonia ([place])    Atrial fibrillation with RVR ([place]) 01/11/2015   Atrial flutter ([place])    a. 08/2016 s/p DCCV.  Remains on flecainide 50 mg bid.   Basal skull fracture ([name]) 20 yrs ago   CHF (congestive heart failure) ([name])    Chronic respiratory failure ([name])    COPD (chronic obstructive pulmonary disease) ([place])    a. 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  2008 prior to Aortic aneurysm repair-->nl cors.   History of stress test    a. 10/2015 MV: no ischemia/infarct.   HLD (hyperlipidemia)    HTN (hypertension)    Hypothyroidism    Obesity    PAF (paroxysmal atrial fibrillation) ([name])    a. on Eliquis; b. CHADS2VASc = 3 (HTN, age x 45, female).   Paroxysmal atrial fibrillation ([name]) 01/24/2015   Right upper quadrant abdominal tenderness without rebound tenderness 03/02/2018   S/P ascending aortic aneurysm repair 2008    Family History: Family History  Problem Relation Age of Onset   Stroke Father    Stroke Paternal Grandmother     Social History: Social History   Socioeconomic History   Marital status: Single    Spouse name: Not on file   Number of children: Not on file   Years of education: Not on file   Highest education level: Not on file  Occupational History   Not on file  Tobacco Use   Smoking status: Former    Types: Cigarettes   Smokeless tobacco: Never  Vaping Use   Vaping Use: Never used  Substance and Sexual Activity   Alcohol use: No   Drug use: No   Sexual activity: Never    Birth control/protection: Surgical  Other Topics Concern   Not on file  Social History Narrative   Not on file   Social Determinants of Health   Financial Resource Strain: Low Risk  (08/04/2021)   Overall Financial Resource Strain (CARDIA)    Difficulty of Paying Living Expenses: Not very hard  Food Insecurity: Not on file  Transportation Needs: Not on file  Physical Activity: Not on file  Stress: Not on file  Social Connections: Not on file  Intimate Partner Violence: Not on file    Vital Signs: Blood pressure 112/60, pulse 71, temperature 98.3 F (36.8 C), resp. rate 16, height 4' 10"  (1.473 m), weight 188 lb 3.2 oz (85.4 kg), SpO2 93 %.  Examination: General Appearance: The patient is well-developed, well-nourished, and in no distress. Skin: Gross inspection of skin unremarkable. Head: normocephalic, no gross  deformities. Eyes: no gross deformities noted. ENT: ears appear grossly normal no exudates. Neck: Supple. No thyromegaly. No LAD. Respiratory: few rhonchi noted. Cardiovascular: Normal S1 and S2 without murmur or rub. Extremities: No cyanosis. pulses are equal. Neurologic: Alert and oriented. No involuntary movements.  LABS: Recent Results (from the past 2160 hour(s))  POCT Urine Drug Screen     Status: Abnormal   Collection Time: 03/17/22 11:44 AM  Result Value Ref Range   POC Methamphetamine UR None Detected None Detected   POC Opiate Ur None Detected None Detected   POC Barbiturate UR None Detected None Detected   POC Amphetamine UR None Detected None Detected   POC Oxycodone UR Positive (A) None Detected   POC Cocaine UR None Detected None Detected   POC Ecstasy UR None Detected None Detected   POC TRICYCLICS UR None Detected None Detected   POC PHENCYCLIDINE UR None Detected None Detected   POC Marijuana UR None Detected None Detected   POC Methadone UR None Detected None Detected   POC BENZODIAZEPINES UR None Detected None Detected   URINE TEMPERATURE     POC DRUG SCREEN OXIDANTS URINE     POC SPECIFIC GRAVITY URINE     POC PH URINE     Methylenedioxyamphetamine None Detected None Detected  Comprehensive metabolic panel     Status: Abnormal   Collection Time: 03/20/22  2:12 PM  Result Value Ref Range   Glucose 104 (H) 70 - 99 mg/dL   BUN 19 8 - 27 mg/dL   Creatinine, Ser 0.89 0.57 - 1.00 mg/dL   eGFR 68 >59 mL/min/1.73   BUN/Creatinine Ratio 21 12 - 28   Sodium 140 134 - 144 mmol/L   Potassium 4.5 3.5 - 5.2 mmol/L   Chloride 102 96 - 106 mmol/L   CO2 23 20 - 29 mmol/L   Calcium 9.6 8.7 - 10.3 mg/dL   Total Protein 7.0 6.0 - 8.5 g/dL   Albumin 4.2 3.8 - 4.8 g/dL    Comment:               **Please note reference interval change**   Globulin, Total 2.8 1.5 - 4.5 g/dL   Albumin/Globulin Ratio 1.5 1.2 - 2.2   Bilirubin Total 0.3 0.0 - 1.2 mg/dL   Alkaline  Phosphatase  113 44 - 121 IU/L   AST 21 0 - 40 IU/L   ALT 13 0 - 32 IU/L  CBC with Differential/Platelet     Status: None   Collection Time: 03/20/22  2:12 PM  Result Value Ref Range   WBC 6.2 3.4 - 10.8 x10E3/uL   RBC 3.95 3.77 - 5.28 x10E6/uL   Hemoglobin 11.4 11.1 - 15.9 g/dL   Hematocrit 34.1 34.0 - 46.6 %   MCV 86 79 - 97 fL   MCH 28.9 26.6 - 33.0 pg   MCHC 33.4 31.5 - 35.7 g/dL   RDW 15.2 11.7 - 15.4 %   Platelets 274 150 - 450 x10E3/uL   Neutrophils 65 Not Estab. %   Lymphs 18 Not Estab. %   Monocytes 14 Not Estab. %   Eos 2 Not Estab. %   Basos 1 Not Estab. %   Neutrophils Absolute 4.1 1.4 - 7.0 x10E3/uL   Lymphocytes Absolute 1.1 0.7 - 3.1 x10E3/uL   Monocytes Absolute 0.8 0.1 - 0.9 x10E3/uL   EOS (ABSOLUTE) 0.1 0.0 - 0.4 x10E3/uL   Basophils Absolute 0.0 0.0 - 0.2 x10E3/uL   Immature Granulocytes 0 Not Estab. %   Immature Grans (Abs) 0.0 0.0 - 0.1 x10E3/uL  POCT Urinalysis Dipstick     Status: None   Collection Time: 04/30/22  4:17 PM  Result Value Ref Range   Color, UA     Clarity, UA     Glucose, UA Negative Negative   Bilirubin, UA Negative    Ketones, UA Negative    Spec Grav, UA 1.010 1.010 - 1.025   Blood, UA Negative    pH, UA 5.0 5.0 - 8.0   Protein, UA Negative Negative   Urobilinogen, UA 0.2 0.2 or 1.0 E.U./dL   Nitrite, UA Negative    Leukocytes, UA Negative Negative   Appearance     Odor    Basic metabolic panel     Status: None   Collection Time: 05/05/22  2:47 PM  Result Value Ref Range   Sodium 140 135 - 145 mmol/L   Potassium 3.6 3.5 - 5.1 mmol/L   Chloride 102 98 - 111 mmol/L   CO2 29 22 - 32 mmol/L   Glucose, Bld 83 70 - 99 mg/dL    Comment: Glucose reference range applies only to samples taken after fasting for at least 8 hours.   BUN 22 8 - 23 mg/dL   Creatinine, Ser 0.92 0.44 - 1.00 mg/dL   Calcium 9.2 8.9 - 10.3 mg/dL   GFR, Estimated >60 >60 mL/min    Comment: (NOTE) Calculated using the CKD-EPI Creatinine Equation (2021)     Anion gap 9 5 - 15    Comment: Performed at [facility], [address], [place], [place] [address]    Radiology: CT Chest High Resolution  Result Date: 06/03/2022 CLINICAL DATA:  Shortness of breath on exertion. Abnormal CT chest. Worsening shortness of breath. EXAM: CT CHEST WITHOUT CONTRAST TECHNIQUE: Multidetector CT imaging of the chest was performed following the standard protocol without intravenous contrast. High resolution imaging of the lungs, as well as inspiratory and expiratory imaging, was performed. RADIATION DOSE REDUCTION: This exam was performed according to the departmental dose-optimization program which includes automated exposure control, adjustment of the mA and/or kV according to patient size and/or use of iterative reconstruction technique. COMPARISON:  01/01/2022, 11/14/2021 and 05/11/2020. FINDINGS: Cardiovascular: Atherosclerotic calcification of the aorta and coronary arteries. Aortic valve replacement. Enlarged pulmonic trunk and  heart. No pericardial effusion. Mediastinum/Nodes: Low-attenuation lesions in the left thyroid measure up to 11 mm. No follow-up necessary. (Ref: J Am Coll Radiol. 2015 Feb;12(2): 143-50).Mediastinal lymph nodes are not enlarged by CT size criteria and appear unchanged. Hilar regions are difficult to definitively evaluate without IV contrast. Similar axillary lymph nodes. Surgical clips in the right subpectoral region. Esophagus is grossly unremarkable. Lungs/Pleura: Centrilobular emphysema. Bilobed nodule in the apical segment right upper lobe measures approximately 0.5 by 1.1 cm (8/29), enlarged from 8 x 8 mm on 11/14/2021 and new from 05/11/2020. Basilar predominant parenchymal ground-glass, mild coarsening and septal thickening. There may be a few scattered subpleural reticular densities. No honeycombing. Mild traction bronchiectasis. Findings appear grossly similar to 11/14/2021 but new from 05/11/2020. Subpleural collapse/consolidation  in the right lower lobe, likely due to scarring when compared with 05/11/2020. No pleural fluid. Airway is unremarkable. Expiratory phase imaging was not performed in true expiration, limiting the evaluation for air trapping. Upper Abdomen: Visualized portion of the liver is unremarkable. Stones in the gallbladder. Visualized portions of the adrenal glands and right kidney are unremarkable. Scarring in the left kidney. Visualized portions of the spleen, pancreas, stomach and bowel are grossly unremarkable. No upper abdominal adenopathy. Musculoskeletal: Degenerative changes in the spine. No worrisome lytic or sclerotic lesions. IMPRESSION: 1. Bilobed nodule in the apical segment right upper lobe, enlarged from 11/14/2021 and new from 05/11/2020, worrisome for primary bronchogenic carcinoma. 2. Basilar pulmonary fibrosis, as described above, may be due to fibrotic nonspecific interstitial pneumonitis or possibly fibrotic hypersensitivity pneumonitis. Usual interstitial pneumonitis is considered less likely. Findings are suggestive of an alternative diagnosis (not UIP) per consensus guidelines: Diagnosis of Idiopathic Pulmonary Fibrosis: An Official ATS/ERS/JRS/ALAT Clinical Practice Guideline. [place], Iss 5, [phone], May 08 2017. 3. Cholelithiasis. 4. Aortic atherosclerosis (ICD10-I70.0). Coronary artery calcification. 5. Enlarged pulmonic trunk, indicative of pulmonary arterial hypertension. 6.  Emphysema (ICD10-J43.9). Electronically Signed   By: Lorin Picket M.D.   On: 06/03/2022 15:58    No results found.  CT Chest High Resolution  Result Date: 06/03/2022 CLINICAL DATA:  Shortness of breath on exertion. Abnormal CT chest. Worsening shortness of breath. EXAM: CT CHEST WITHOUT CONTRAST TECHNIQUE: Multidetector CT imaging of the chest was performed following the standard protocol without intravenous contrast. High resolution imaging of the lungs, as well as inspiratory and  expiratory imaging, was performed. RADIATION DOSE REDUCTION: This exam was performed according to the departmental dose-optimization program which includes automated exposure control, adjustment of the mA and/or kV according to patient size and/or use of iterative reconstruction technique. COMPARISON:  01/01/2022, 11/14/2021 and 05/11/2020. FINDINGS: Cardiovascular: Atherosclerotic calcification of the aorta and coronary arteries. Aortic valve replacement. Enlarged pulmonic trunk and heart. No pericardial effusion. Mediastinum/Nodes: Low-attenuation lesions in the left thyroid measure up to 11 mm. No follow-up necessary. (Ref: J Am Coll Radiol. 2015 Feb;12(2): 143-50).Mediastinal lymph nodes are not enlarged by CT size criteria and appear unchanged. Hilar regions are difficult to definitively evaluate without IV contrast. Similar axillary lymph nodes. Surgical clips in the right subpectoral region. Esophagus is grossly unremarkable. Lungs/Pleura: Centrilobular emphysema. Bilobed nodule in the apical segment right upper lobe measures approximately 0.5 by 1.1 cm (8/29), enlarged from 8 x 8 mm on 11/14/2021 and new from 05/11/2020. Basilar predominant parenchymal ground-glass, mild coarsening and septal thickening. There may be a few scattered subpleural reticular densities. No honeycombing. Mild traction bronchiectasis. Findings appear grossly similar to 11/14/2021 but new from 05/11/2020. Subpleural collapse/consolidation in  the right lower lobe, likely due to scarring when compared with 05/11/2020. No pleural fluid. Airway is unremarkable. Expiratory phase imaging was not performed in true expiration, limiting the evaluation for air trapping. Upper Abdomen: Visualized portion of the liver is unremarkable. Stones in the gallbladder. Visualized portions of the adrenal glands and right kidney are unremarkable. Scarring in the left kidney. Visualized portions of the spleen, pancreas, stomach and bowel are grossly  unremarkable. No upper abdominal adenopathy. Musculoskeletal: Degenerative changes in the spine. No worrisome lytic or sclerotic lesions. IMPRESSION: 1. Bilobed nodule in the apical segment right upper lobe, enlarged from 11/14/2021 and new from 05/11/2020, worrisome for primary bronchogenic carcinoma. 2. Basilar pulmonary fibrosis, as described above, may be due to fibrotic nonspecific interstitial pneumonitis or possibly fibrotic hypersensitivity pneumonitis. Usual interstitial pneumonitis is considered less likely. Findings are suggestive of an alternative diagnosis (not UIP) per consensus guidelines: Diagnosis of Idiopathic Pulmonary Fibrosis: An Official ATS/ERS/JRS/ALAT Clinical Practice Guideline. [place], Iss 5, [phone], May 08 2017. 3. Cholelithiasis. 4. Aortic atherosclerosis (ICD10-I70.0). Coronary artery calcification. 5. Enlarged pulmonic trunk, indicative of pulmonary arterial hypertension. 6.  Emphysema (ICD10-J43.9). Electronically Signed   By: Lorin Picket M.D.   On: 06/03/2022 15:58      Assessment and Plan: Patient Active Problem List   Diagnosis Date Noted   Chronic upper back pain 04/14/2022   Chronic myofascial pain 04/14/2022   Musculoskeletal disorder involving upper trapezius muscle 04/14/2022   Abnormal CT scan, cervical spine (03/04/2022) 04/01/2022   Trigger point of shoulder region (Bilateral) 04/01/2022   Trigger point with neck pain 04/01/2022   DISH (diffuse idiopathic skeletal hyperostosis) 03/12/2022   Atrial fibrillation, chronic ([place]) 01/26/2022   Morbid obesity ([place]) 01/26/2022   Chronic respiratory failure with hypoxia ([place]) 01/26/2022   Bilateral pneumonia 01/25/2022   Chronic intractable headache 79/39/0300   Acute metabolic encephalopathy 92/33/0076   Chronic diastolic CHF (congestive heart failure) ([place]) 12/02/2021   HLD (hyperlipidemia) 12/02/2021   Stroke ([place]) 12/02/2021   Iron deficiency anemia 12/02/2021   Depression  12/02/2021   Acute on chronic respiratory failure with hypoxia ([place]) 11/11/2021   Unable to maintain body in lying position 10/16/2021   Orthopnea 10/16/2021   Class 2 obesity with alveolar hypoventilation, serious comorbidity, and body mass index (BMI) of 39.0 to 39.9 in adult ([place]) 10/16/2021   At high risk for postoperative complications 22/63/3354   Hearing loss 08/12/2021   Long term prescription benzodiazepine use 04/30/2021   Chronic shoulder pain (1ry area of Pain) (Bilateral) (L>R) 04/30/2021   Chronic upper extremity pain (2ry area of Pain) (Bilateral) (L>R) 04/30/2021   Cervicalgia 04/30/2021   Chronic neck pain (3ry area of Pain) (Posterior) (Bilateral) (L>R) 04/30/2021   Shoulder blade pain (4th area of Pain) (Left) 04/30/2021   Osteoarthritis of glenohumeral joint (Left) 04/30/2021   Osteoarthritis of AC (acromioclavicular) joint (Left) 04/30/2021   Osteoarthritis of glenohumeral joints (Bilateral) 04/30/2021   Osteoarthritis of acromioclavicular joints (Bilateral) 04/30/2021   Primary osteoarthritis of shoulders (Bilateral) 04/30/2021   DDD (degenerative disc disease), cervical 04/30/2021   Cervical radiculitis (Left) 04/30/2021   Cervical radiculopathy (Left) 04/30/2021   C6 radiculopathy (Left) 04/30/2021   C7 radiculopathy (Left) 04/30/2021   Wheelchair dependence 04/30/2021   Chronic pain syndrome 04/29/2021   Pharmacologic therapy 04/29/2021   Disorder of skeletal system 04/29/2021   Problems influencing health status 04/29/2021   Chronic anticoagulation ([place]is) 03/14/2021   Hx of atrioventricular node ablation 03/14/2021   Symptomatic  anemia 01/29/2021   Acute on chronic diastolic CHF (congestive heart failure) ([place]) 05/11/2020   Chronic obstructive pulmonary disease (COPD) ([place])    Hypothyroidism    Cardiac pacemaker in situ 05/10/2020   Acute non-recurrent frontal sinusitis 04/22/2020   Vertigo 04/22/2020   S/P placement of cardiac pacemaker 02/21/2020    Atrial fibrillation status post cardioversion ([place]) 11/16/2019   Episode of moderate major depression ([place]) 10/10/2019   Persistent atrial fibrillation ([place])    Encounter for general adult medical examination with abnormal findings 07/10/2019   Encounter for screening mammogram for malignant neoplasm of breast 07/10/2019   Atopic dermatitis 05/02/2019   Paroxysmal atrial flutter ([place]) 08/11/2018   Encounter for long-term (current) use of medications 07/09/2018   Chronic left shoulder pain 07/09/2018   Conjunctivitis 07/09/2018   Oxygen dependent 07/09/2018   GAD (generalized anxiety disorder) 07/09/2018   Ovarian failure 07/09/2018   Positive colorectal cancer screening using Cologuard test 04/24/2018   Calculus of gallbladder with acute on chronic cholecystitis 04/08/2018   H/O: CVA (cerebrovascular accident) 04/08/2018   Dysuria 03/02/2018   Right upper quadrant abdominal tenderness without rebound tenderness 03/02/2018   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 exacerbation 03/02/2018   Chronic obstructive pulmonary disease ([place]) 09/19/2017   Typical atrial flutter ([name])    Irritable bowel syndrome without diarrhea 01/29/2015   Essential hypertension 01/24/2015   Paroxysmal atrial fibrillation ([place]) 01/24/2015   History of aortic valve replacement with bioprosthetic valve 01/24/2015   Atrial fibrillation with RVR ([place]) 01/11/2015   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 05/18/2014   Shoulder impingement syndrome (Left) 05/18/2014   Cellulitis and abscess 02/13/2014   MRSA (methicillin resistant Staphylococcus aureus) 02/13/2014   Aneurysm, ascending aorta ([place]) 06/23/2013   Bicuspid aortic valve 06/23/2013    1. Pulmonary nodule 1 cm or greater in diameter We will get a follow-up imaging study have gone ahead and ordered PET scan - NM PET Image Initial (PI) Skull Base To Thigh (F-18 FDG); Future  2. ILD (interstitial lung disease) ([place]) Significant  interstitial lung disease noted which was known on her follow-up CT scan.  3. Chronic diastolic heart failure ([name]) Appears to be compensated monitor fluid status closely.  4. Flu vaccine need Given flu vaccine - Flu Vaccine MDCK QUAD PF  5. Oxygen dependent Continue with oxygen therapy as prescribed - For home use only DME oxygen  6. Chronic respiratory failure with hypoxia ([name]) As above on oxygen therapy. - For home use only DME oxygen   General Counseling: I have discussed the findings of the evaluation and examination with Loranda.  I have also discussed any further diagnostic evaluation thatmay be needed or ordered today. Claribel verbalizes understanding of the findings of todays visit. We also reviewed her medications today and discussed drug interactions and side effects including but not limited excessive drowsiness and altered mental states. We also discussed that there is always a risk not just to her but also people around her. she has been encouraged to call the office with any questions or concerns that should arise related to todays visit.  Orders Placed This Encounter  Procedures   NM PET Image Initial (PI) Skull Base To Thigh (F-18 FDG)    Standing Status:   Future    Standing Expiration Date:   06/12/2023    Order Specific Question:   If indicated for the ordered procedure, I authorize the administration of a radiopharmaceutical per Radiology protocol    Answer:   Yes  Order Specific Question:   Preferred imaging location?    Answer:   [facility]     Time spent: 36  I have personally obtained a history, examined the patient, evaluated laboratory and imaging results, formulated the assessment and plan and placed orders.    Allyne Gee, MD [facility] Pulmonary and Critical Care Sleep medicine

## 2022-06-14 NOTE — Progress Notes (Unsigned)
PROVIDER NOTE: Interpretation of information contained herein should be left to medically-trained personnel. Specific patient instructions are provided elsewhere under "Patient Instructions" section of medical record. This document was created in part using STT-dictation technology, any transcriptional errors that may result from this process are unintentional.  Patient: [name] Type: Established DOB: [birth year] MRN: [id number] PCP: [name], MD  Service: Procedure DOS: 06/16/2022 Setting: Ambulatory Location: Ambulatory outpatient facility Delivery: Face-to-face Provider: Gaspar Cola, MD Specialty: Interventional Pain Management Specialty designation: 09 Location: Outpatient facility Ref. Prov.: [name], MD    Primary Reason for Visit: Interventional Pain Management Treatment. CC: Shoulder Pain (Down arms bilateral to hands)    Procedure:          Anesthesia, Analgesia, Anxiolysis:  Type: Trapezius and supraspinatus muscle Trigger Point Injection (1-2 muscle groups) #2  CPT: 20552 Primary Purpose: Therapeutic Region: Upper Cervicothoracic and shoulder Level: Cervico-thoracic/shoulder Target Area: Trapezius and supraspinatus muscle Trigger Point Approach: Percutaneous, ipsilateral approach. Laterality: Bilateral        Type: Local Anesthesia Local Anesthetic: Lidocaine 1-2% Sedation: None  Indication(s):  Analgesia Route: Infiltration ([place]/IM) IV Access: N/A   Position: Sitting   1. Chronic myofascial pain   2. Musculoskeletal disorder involving upper trapezius muscle   3. Trigger point of shoulder region, unspecified laterality   4. Trigger point with neck pain   5. Chronic shoulder pain (1ry area of Pain) (Bilateral) (L>R)   6. Chronic neck pain (3ry area of Pain) (Posterior) (Bilateral) (L>R)   7. Chronic upper back pain   8. Shoulder blade pain (4th area of Pain) (Left)   9. DISH (diffuse idiopathic skeletal hyperostosis)    NAS-11 Pain score:    Pre-procedure: 7 /10   Post-procedure: 3  (3 ON THE RIGHT, NO CHANGE ON THE LEFT 8)/10     Pre-op H&P Assessment:  [name] is a 76 y.o. (year old), female patient, seen today for interventional treatment. She  has a past surgical history that includes Abdominal aortic aneurysm repair (2008); Aortic valve replacement (2008); Abdominal hysterectomy; Carpal tunnel release; Tumor excision (Left); Cardiac catheterization; Cardiac catheterization (N/A, 08/17/2016); CARDIOVERSION (N/A, 08/15/2018); CARDIOVERSION (N/A, 11/14/2018); Cardioversion (N/A, 06/05/2019); Cardioversion (N/A, 08/14/2019); Cardioversion (N/A, 11/09/2019); and Cardioversion (N/A, 01/17/2020). [name] has a current medication list which includes the following prescription(s): alprazolam, amlodipine, apixaban, atorvastatin, breztri aerosphere, bumetanide, calcium carbonate-vitamin d, carvedilol, coq10, diclofenac sodium, empagliflozin, ferrous sulfate, flecainide, ipratropium-albuterol, levothyroxine, lisinopril, loratadine, oxycodone-acetaminophen, pantoprazole, potassium chloride, sertraline, sodium chloride, trazodone, and ventolin hfa. Her primarily concern today is the Shoulder Pain (Down arms bilateral to hands)  Initial Vital Signs:  Pulse/HCG Rate: 73  Temp: (!) 97.3 F (36.3 C) Resp: 16 BP: 111/79 SpO2: 97 % (3L)  BMI: Estimated body mass index is 38.87 kg/m as calculated from the following:   Height as of this encounter: 4\' 10"  (1.473 m).   Weight as of this encounter: 186 lb (84.4 kg).  Risk Assessment: Allergies: Reviewed. She is allergic to benadryl [diphenhydramine hcl (sleep)], cetirizine, lasix [furosemide], levaquin [levofloxacin in d5w], meloxicam, soy allergy, and sulfa antibiotics.  Allergy Precautions: None required Coagulopathies: Reviewed. None identified.  Blood-thinner therapy: None at this time Active Infection(s): Reviewed. None identified. [name] is afebrile  Site Confirmation: [name] was asked  to confirm the procedure and laterality before marking the site Procedure checklist: Completed Consent: Before the procedure and under the influence of no sedative(s), amnesic(s), or anxiolytics, the patient was informed of the treatment options, risks and possible  complications. To fulfill our ethical and legal obligations, as recommended by the American Medical Association's Code of Ethics, I have informed the patient of my clinical impression; the nature and purpose of the treatment or procedure; the risks, benefits, and possible complications of the intervention; the alternatives, including doing nothing; the risk(s) and benefit(s) of the alternative treatment(s) or procedure(s); and the risk(s) and benefit(s) of doing nothing. The patient was provided information about the general risks and possible complications associated with the procedure. These may include, but are not limited to: failure to achieve desired goals, infection, bleeding, organ or nerve damage, allergic reactions, paralysis, and death. In addition, the patient was informed of those risks and complications associated to the procedure, such as failure to decrease pain; infection; bleeding; organ or nerve damage with subsequent damage to sensory, motor, and/or autonomic systems, resulting in permanent pain, numbness, and/or weakness of one or several areas of the body; allergic reactions; (i.e.: anaphylactic reaction); and/or death. Furthermore, the patient was informed of those risks and complications associated with the medications. These include, but are not limited to: allergic reactions (i.e.: anaphylactic or anaphylactoid reaction(s)); adrenal axis suppression; blood sugar elevation that in diabetics may result in ketoacidosis or comma; water retention that in patients with history of congestive heart failure may result in shortness of breath, pulmonary edema, and decompensation with resultant heart failure; weight gain; swelling or  edema; medication-induced neural toxicity; particulate matter embolism and blood vessel occlusion with resultant organ, and/or nervous system infarction; and/or aseptic necrosis of one or more joints. Finally, the patient was informed that Medicine is not an exact science; therefore, there is also the possibility of unforeseen or unpredictable risks and/or possible complications that may result in a catastrophic outcome. The patient indicated having understood very clearly. We have given the patient no guarantees and we have made no promises. Enough time was given to the patient to ask questions, all of which were answered to the patient's satisfaction. Ms. Downie has indicated that she wanted to continue with the procedure. Attestation: I, the ordering provider, attest that I have discussed with the patient the benefits, risks, side-effects, alternatives, likelihood of achieving goals, and potential problems during recovery for the procedure that I have provided informed consent. Date  Time: 06/16/2022 11:19 AM  Pre-Procedure Preparation:  Monitoring: As per clinic protocol. Respiration, ETCO2, SpO2, BP, heart rate and rhythm monitor placed and checked for adequate function Safety Precautions: Patient was assessed for positional comfort and pressure points before starting the procedure. Time-out: I initiated and conducted the "Time-out" before starting the procedure, as per protocol. The patient was asked to participate by confirming the accuracy of the "Time Out" information. Verification of the correct person, site, and procedure were performed and confirmed by me, the nursing staff, and the patient. "Time-out" conducted as per Joint Commission's Universal Protocol (UP.01.01.01). Time: 1138  Description of Procedure:          Area Prepped: Entire             Region DuraPrep (Iodine Povacrylex [0.7% available iodine] and Isopropyl Alcohol, 74% w/w) Safety Precautions: Aspiration looking for blood  return was conducted prior to all injections. At no point did we inject any substances, as a needle was being advanced. No attempts were made at seeking any paresthesias. Safe injection practices and needle disposal techniques used. Medications properly checked for expiration dates. SDV (single dose vial) medications used. Description of the Procedure: Protocol guidelines were followed. The patient was placed in position  over the fluoroscopy table. The target area was identified and the area prepped in the usual manner. Skin & deeper tissues infiltrated with local anesthetic. Appropriate amount of time allowed to pass for local anesthetics to take effect. The procedure needles were then advanced to the target area. Proper needle placement secured. Negative aspiration confirmed. Solution injected in intermittent fashion, asking for systemic symptoms every 0.5cc of injectate. The needles were then removed and the area cleansed, making sure to leave some of the prepping solution back to take advantage of its long term bactericidal properties.  Vitals:   06/16/22 1118 06/16/22 1143  BP: 111/79 121/68  Pulse: 73 72  Resp: 16 18  Temp: (!) 97.3 F (36.3 C)   SpO2: 97% 95%  Weight: 186 lb (84.4 kg)   Height: 4\' 10"  (1.473 m)     Start Time: 1138 hrs. End Time: 1142 hrs. Materials:  Needle(s) Type: Regular needle Gauge: 25G Length: 1.5-in Medication(s): Please see orders for medications and dosing details.  Imaging Guidance:          Type of Imaging Technique: None used Indication(s): N/A Exposure Time: No patient exposure Contrast: None used. Fluoroscopic Guidance: N/A Ultrasound Guidance: N/A Interpretation: N/A  Antibiotic Prophylaxis:   Anti-infectives (From admission, onward)    None      Indication(s): None identified  Post-operative Assessment:  Post-procedure Vital Signs:  Pulse/HCG Rate: 72  Temp: (!) 97.3 F (36.3 C) Resp: 18 BP: 121/68 SpO2: 95 % (ON 3 LITERS OF  HOME O2)  EBL: None  Complications: No immediate post-treatment complications observed by team, or reported by patient.  Note: The patient tolerated the entire procedure well. A repeat set of vitals were taken after the procedure and the patient was kept under observation following institutional policy, for this type of procedure. Post-procedural neurological assessment was performed, showing return to baseline, prior to discharge. The patient was provided with post-procedure discharge instructions, including a section on how to identify potential problems. Should any problems arise concerning this procedure, the patient was given instructions to immediately contact us, at any time, without hesitation. In any case, we plan to contact the patient by telephone for a follow-up status report regarding this interventional procedure.  Comments:  No additional relevant information.  Plan of Care  Orders:  Orders Placed This Encounter  Procedures   TRIGGER POINT INJECTION    Scheduling Instructions:     Area: Upper Back     Side: Bilateral     Sedation: No Sedation.     Timeframe: Today    Order Specific Question:   Where will this procedure be performed?    Answer:   [facility]   Informed Consent Details: Physician/Practitioner Attestation; Transcribe to consent form and obtain patient signature    Provider Attestation: I, Creston Dossie Arbour, MD, (Pain Management Specialist), the physician/practitioner, attest that I have discussed with the patient the benefits, risks, side effects, alternatives, likelihood of achieving goals and potential problems during recovery for the procedure that I have provided informed consent.    Scheduling Instructions:     Note: Always confirm laterality of pain with Ms. Therien, before procedure.     Transcribe to consent form and obtain patient signature.    Order Specific Question:   Physician/Practitioner attestation of informed consent for  procedure/surgical case    Answer:   I, the physician/practitioner, attest that I have discussed with the patient the benefits, risks, side effects, alternatives, likelihood of achieving goals and potential  problems during recovery for the procedure that I have provided informed consent.    Order Specific Question:   Procedure    Answer:   Myoneural Block (Trigger Point injection)    Order Specific Question:   Physician/Practitioner performing the procedure    Answer:   Avyonna Wagoner A. Dossie Arbour MD    Order Specific Question:   Indication/Reason    Answer:   Musculoskeletal pain/myofascial pain secondary to trigger point   Care order/instruction: Please confirm that the patient has stopped the Eliquis (Apixaban) x 3 days prior to procedure or surgery.    Please confirm that the patient has stopped the Eliquis (Apixaban) x 3 days prior to procedure or surgery.    Standing Status:   Standing    Number of Occurrences:   1   Provide equipment / supplies at bedside    "Block Tray" (Disposable  single use) Needle type: SpinalSpinal Amount/quantity: 2 Size: Short(1.5-inch) Gauge: (25G x1) + (22G x1)    Standing Status:   Standing    Number of Occurrences:   1    Order Specific Question:   Specify    Answer:   Block Tray   Bleeding precautions    Standing Status:   Standing    Number of Occurrences:   1   Chronic Opioid Analgesic:  Oxycodone/APAP 7.5/325, 1 tab p.o. daily (# 45) (last filled on 06/02/2021) by Erich Montane, FNP-C, working for Dr. Talmadge Chad, MD. MME/day: 7.5 mg/day   Medications ordered for procedure: Meds ordered this encounter  Medications   lidocaine (XYLOCAINE) 2 % (with pres) injection 400 mg   pentafluoroprop-tetrafluoroeth (GEBAUERS) aerosol   ropivacaine (PF) 2 mg/mL (0.2%) (NAROPIN) injection 9 mL   triamcinolone acetonide (KENALOG-40) injection 40 mg   Medications administered: We administered lidocaine, pentafluoroprop-tetrafluoroeth, ropivacaine  (PF) 2 mg/mL (0.2%), and triamcinolone acetonide.  See the medical record for exact dosing, route, and time of administration.  Follow-up plan:   Return in about 2 weeks (around 06/30/2022) for Proc-day (T,Th), (VV), (PPE).        Interventional Therapies  Risk  Complexity Considerations:   Estimated body mass index is 38.87 kg/m as calculated from the following:   Height as of this encounter: 4\' 10"  (1.473 m).   Weight as of this encounter: 186 lb (84.4 kg). 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 DISH: (Left C7-T1 calcified ligamentum flavum) (see 10/04/2021 cervical CT Se: 7 Im: 39/81; Se: 9 Im: 56/88; Se: 11 Im: 76/88; Se: 5 Im: 63/76)   Planned  Pending:      Under consideration:   Palliative bilateral trapezius and supraspinatus m. MNB/TPI #3    Completed:   Diagnostic bilateral suprascapular NB x2 (09/25/2021) (100/100/100/100 x 2.5 months)  Therapeutic bilateral Trapezius and supraspinatus MNB x2 (06/16/2022) (100/100/90/90)    Therapeutic  Palliative (PRN) options:   Therapeutic upper back and shoulder (trapezius & supraspinatus) MNB/TPI #2     Recent Visits Date Type Provider Dept  04/28/22 Office Visit Milinda Pointer, MD [facility]  04/14/22 Procedure visit Milinda Pointer, MD [facility]  04/01/22 Office Visit Milinda Pointer, MD [facility]  Showing recent visits within past 90 days and meeting all other requirements Today's Visits Date Type Provider Dept  06/16/22 Procedure visit Milinda Pointer, MD [facility]  Showing  today's visits and meeting all other requirements Future Appointments Date Type Provider Dept  07/02/22 Appointment Milinda Pointer, MD [facility]  Showing future  appointments within next 90 days and meeting all other requirements  Disposition: Discharge home  Discharge (Date  Time): 06/16/2022; 1152 hrs.   Primary Care Physician: [name], MD Location: [facility] Note by: Gaspar Cola, MD Date: 06/16/2022; Time: 12:02 PM  Disclaimer:  Medicine is not an Chief Strategy Officer. The only guarantee in medicine is that nothing is guaranteed. It is important to note that the decision to proceed with this intervention was based on the information collected from the patient. The Data and conclusions were drawn from the patient's questionnaire, the interview, and the physical examination. Because the information was provided in large part by the patient, it cannot be guaranteed that it has not been purposely or unconsciously manipulated. Every effort has been made to obtain as much relevant data as possible for this evaluation. It is important to note that the conclusions that lead to this procedure are derived in large part from the available data. Always take into account that the treatment will also be dependent on availability of resources and existing treatment guidelines, considered by other Pain Management Practitioners as being common knowledge and practice, at the time of the intervention. For Medico-Legal purposes, it is also important to point out that variation in procedural techniques and pharmacological choices are the acceptable norm. The indications, contraindications, technique, and results of the above procedure should only be interpreted and judged by a Board-Certified Interventional Pain Specialist with extensive familiarity and expertise in the same exact procedure and technique.

## 2022-06-15 ENCOUNTER — Telehealth: Payer: Self-pay | Admitting: Internal Medicine

## 2022-06-15 ENCOUNTER — Ambulatory Visit: Payer: Medicare Other | Admitting: Physician Assistant

## 2022-06-15 DIAGNOSIS — Z950 Presence of cardiac pacemaker: Secondary | ICD-10-CM | POA: Diagnosis not present

## 2022-06-15 NOTE — Telephone Encounter (Signed)
Spoke with patient. She is doing well with new O2 set up. Gave her date, time, location and prep for PET scan-Toni

## 2022-06-15 NOTE — Telephone Encounter (Signed)
Lvm to follow up on O2 and to notify patient of PET scan-[name]

## 2022-06-15 NOTE — Telephone Encounter (Signed)
Patient called Saturday stating her 02 concentrator had stopped working and supplier could not get her another one until Monday. Patient stated all she had is the portable 02 which only went up to 3 liters only when she breathes in. She needs continuous flow on 4 liters. I spoke with Claiborne Billings with [facility] patient. She stated patient had called her 4 times since Friday night. She explained they could not supply her with O2 since they are not her supplier, but for her to hook her ventilator up to portable and see if that helped. I spoke with dfk. She stated patient could either switch back to [facility] or would have to go to ED in order for her to get her proper O2 flow until new concentrator arrived on Monday. DSK put in order for O2 set up with [facility] in case patient chose to switch. I notified Kelly. She stated they would set patient up that same day. I told her I would send order to them on Monday. I notified patient of what was going. She was very happy and stated she would be switching back to [facility]-Toni

## 2022-06-16 ENCOUNTER — Ambulatory Visit: Payer: Medicare Other | Attending: Pain Medicine | Admitting: Pain Medicine

## 2022-06-16 ENCOUNTER — Encounter: Payer: Self-pay | Admitting: Pain Medicine

## 2022-06-16 VITALS — BP 121/68 | HR 72 | Temp 97.3°F | Resp 18 | Ht <= 58 in | Wt 186.0 lb

## 2022-06-16 DIAGNOSIS — M629 Disorder of muscle, unspecified: Secondary | ICD-10-CM | POA: Insufficient documentation

## 2022-06-16 DIAGNOSIS — Z7901 Long term (current) use of anticoagulants: Secondary | ICD-10-CM | POA: Diagnosis not present

## 2022-06-16 DIAGNOSIS — M7918 Myalgia, other site: Secondary | ICD-10-CM | POA: Insufficient documentation

## 2022-06-16 DIAGNOSIS — M898X1 Other specified disorders of bone, shoulder: Secondary | ICD-10-CM | POA: Insufficient documentation

## 2022-06-16 DIAGNOSIS — M542 Cervicalgia: Secondary | ICD-10-CM | POA: Diagnosis not present

## 2022-06-16 DIAGNOSIS — G8929 Other chronic pain: Secondary | ICD-10-CM | POA: Diagnosis not present

## 2022-06-16 DIAGNOSIS — M25519 Pain in unspecified shoulder: Secondary | ICD-10-CM | POA: Insufficient documentation

## 2022-06-16 DIAGNOSIS — M25511 Pain in right shoulder: Secondary | ICD-10-CM | POA: Insufficient documentation

## 2022-06-16 DIAGNOSIS — M25512 Pain in left shoulder: Secondary | ICD-10-CM | POA: Insufficient documentation

## 2022-06-16 DIAGNOSIS — M4810 Ankylosing hyperostosis [Forestier], site unspecified: Secondary | ICD-10-CM | POA: Diagnosis not present

## 2022-06-16 DIAGNOSIS — M549 Dorsalgia, unspecified: Secondary | ICD-10-CM | POA: Diagnosis not present

## 2022-06-16 MED ORDER — TRIAMCINOLONE ACETONIDE 40 MG/ML IJ SUSP
40.0000 mg | Freq: Once | INTRAMUSCULAR | Status: AC
  Administered 2022-06-16: 40 mg

## 2022-06-16 MED ORDER — TRIAMCINOLONE ACETONIDE 40 MG/ML IJ SUSP
INTRAMUSCULAR | Status: AC
  Filled 2022-06-16: qty 1

## 2022-06-16 MED ORDER — ROPIVACAINE HCL 2 MG/ML IJ SOLN
INTRAMUSCULAR | Status: AC
  Filled 2022-06-16: qty 20

## 2022-06-16 MED ORDER — LIDOCAINE HCL (PF) 2 % IJ SOLN
INTRAMUSCULAR | Status: AC
  Filled 2022-06-16: qty 5

## 2022-06-16 MED ORDER — LIDOCAINE HCL 2 % IJ SOLN
20.0000 mL | Freq: Once | INTRAMUSCULAR | Status: AC
  Administered 2022-06-16: 100 mg

## 2022-06-16 MED ORDER — ROPIVACAINE HCL 2 MG/ML IJ SOLN
9.0000 mL | Freq: Once | INTRAMUSCULAR | Status: AC
  Administered 2022-06-16: 9 mL

## 2022-06-16 MED ORDER — PENTAFLUOROPROP-TETRAFLUOROETH EX AERO
INHALATION_SPRAY | Freq: Once | CUTANEOUS | Status: AC
  Administered 2022-06-16: 30 via TOPICAL
  Filled 2022-06-16: qty 116

## 2022-06-16 NOTE — Progress Notes (Signed)
Safety precautions to be maintained throughout the outpatient stay will include: orient to surroundings, keep bed in low position, maintain call bell within reach at all times, provide assistance with transfer out of bed and ambulation.  

## 2022-06-16 NOTE — Patient Instructions (Signed)
Pain Management Discharge Instructions  General Discharge Instructions :  If you need to reach your doctor call: Monday-Friday 8:00 am - 4:00 pm at [phone] or toll free [phone].  After clinic hours [phone] to have operator reach doctor.  Bring all of your medication bottles to all your appointments in the pain clinic.  To cancel or reschedule your appointment with Pain Management please remember to call 24 hours in advance to avoid a fee.  Refer to the educational materials which you have been given on: General Risks, I had my Procedure. Discharge Instructions, Post Sedation.  Post Procedure Instructions:  The drugs you were given will stay in your system until tomorrow, so for the next 24 hours you should not drive, make any legal decisions or drink any alcoholic beverages.  You may eat anything you prefer, but it is better to start with liquids then soups and crackers, and gradually work up to solid foods.  Please notify your doctor immediately if you have any unusual bleeding, trouble breathing or pain that is not related to your normal pain.  Depending on the type of procedure that was done, some parts of your body may feel week and/or numb.  This usually clears up by tonight or the next day.  Walk with the use of an assistive device or accompanied by an adult for the 24 hours.  You may use ice on the affected area for the first 24 hours.  Put ice in a Ziploc bag and cover with a towel and place against area 15 minutes on 15 minutes off.  You may switch to heat after 24 hours.Trigger Point Injection Trigger points are areas where you have pain. A trigger point injection is a shot given in the trigger point to help relieve pain for a few days to a few months. Common places for trigger points include the neck, shoulders, upper back, or lower back. A trigger point injection will not cure long-term (chronic) pain permanently. These injections do not always work for every  person. For some people, they can help to relieve pain for a few days to a few months. Tell a health care provider about: Any allergies you have. All medicines you are taking, including vitamins, herbs, eye drops, creams, and over-the-counter medicines. Any problems you or family members have had with anesthetic medicines. Any bleeding problems you have. Any surgeries you have had. Any medical conditions you have. Whether you are pregnant or may be pregnant. What are the risks? Generally, this is a safe procedure. However, problems may occur, including: Infection. Bleeding or bruising. Allergic reaction to the injected medicine. Irritation of the skin around the injection site. What happens before the procedure? Ask your health care provider about: Changing or stopping your regular medicines. This is especially important if you are taking diabetes medicines or blood thinners. Taking medicines such as aspirin and ibuprofen. These medicines can thin your blood. Do not take these medicines unless your health care provider tells you to take them. Taking over-the-counter medicines, vitamins, herbs, and supplements. What happens during the procedure?  Your health care provider will feel for trigger points. A marker may be used to circle the area for the injection. The skin over the trigger point will be washed with a germ-killing soap. You may be given a medicine to help you relax (sedative). A thin needle is used for the injection. You may feel pain or a twitching feeling when the needle enters your skin. A numbing solution may be injected  into the trigger point. Sometimes a medicine to keep down inflammation is also injected. Your health care provider may move the needle around the area where the trigger point is located until the tightness and twitching goes away. After the injection, your health care provider may put gentle pressure over the injection site. The injection site will be  covered with a bandage (dressing). The procedure may vary among health care providers and hospitals. What can I expect after treatment? After treatment, you may have soreness and stiffness for 1-2 days. Follow these instructions at home: Injection site care Remove your dressing in a few hours, or as told by your health care provider. Check your injection site every day for signs of infection. Check for: Redness, swelling, or pain. Fluid or blood. Warmth. Pus or a bad smell. Managing pain, stiffness, and swelling If directed, put ice on the affected area. To do this: Put ice in a plastic bag. Place a towel between your skin and the bag. Leave the ice on for 20 minutes, 2-3 times a day. Remove the ice if your skin turns bright red. This is very important. If you cannot feel pain, heat, or cold, you have a greater risk of damage to the area. Activity If you were given a sedative during the procedure, it can affect you for several hours. Do not drive or operate machinery until your health care provider says that it is safe. Do not take baths, swim, or use a hot tub until your health care provider approves. Return to your normal activities as told by your health care provider. Ask your health care provider what activities are safe for you. General instructions If you were asked to stop your regular medicines, ask your health care provider when you may start taking them again. You may be asked to see an occupational or physical therapist for exercises that reduce muscle strain and stretch the area of the trigger point. Keep all follow-up visits. This is important. Contact a health care provider if: Your pain comes back, and it is worse than before the injection. You may need more injections. You have chills or a fever. The injection site becomes more painful, red, swollen, or warm to the touch. Summary A trigger point injection is a shot given in the trigger point to help relieve  pain. Common places for trigger point injections are the neck, shoulders, upper back, and lower back. These injections do not always work for every person, but for some people, the injections can help to relieve pain for a few days to a few months. Contact a health care provider if symptoms come back or if they are worse than before treatment. Also, get help if the injection site becomes more painful, red, swollen, or warm to the touch. This information is not intended to replace advice given to you by your health care provider. Make sure you discuss any questions you have with your health care provider. Document Revised: 12/03/2020 Document Reviewed: 12/03/2020 Elsevier Patient Education  [place].

## 2022-06-17 ENCOUNTER — Ambulatory Visit: Payer: Medicare Other

## 2022-06-17 ENCOUNTER — Telehealth: Payer: Self-pay | Admitting: *Deleted

## 2022-06-17 NOTE — Telephone Encounter (Signed)
Attempted to call for post procedure follow-up. Message left. 

## 2022-06-18 ENCOUNTER — Ambulatory Visit: Payer: Medicare Other | Admitting: Internal Medicine

## 2022-06-18 VITALS — BP 104/61 | Temp 98.2°F | Resp 16 | Ht <= 58 in | Wt 186.0 lb

## 2022-06-18 DIAGNOSIS — J449 Chronic obstructive pulmonary disease, unspecified: Secondary | ICD-10-CM

## 2022-06-18 DIAGNOSIS — R0602 Shortness of breath: Secondary | ICD-10-CM

## 2022-06-18 NOTE — Progress Notes (Signed)
Pt was here to   for 6 minute walk her Pulse oximetry on room air is Oxygen  80% and 2 litre pt was on 2 litre  oxygen 97% pt is unable to do 6 minute walk due pt room air is 80%

## 2022-06-24 ENCOUNTER — Telehealth: Payer: Self-pay | Admitting: Internal Medicine

## 2022-06-24 NOTE — Telephone Encounter (Signed)
Faxed demographics & O2 readings to [facility]; [phone]

## 2022-06-25 ENCOUNTER — Ambulatory Visit: Payer: Medicare Other | Admitting: Internal Medicine

## 2022-06-28 DIAGNOSIS — Z950 Presence of cardiac pacemaker: Secondary | ICD-10-CM | POA: Diagnosis not present

## 2022-06-29 ENCOUNTER — Ambulatory Visit
Admission: RE | Admit: 2022-06-29 | Discharge: 2022-06-29 | Disposition: A | Discharge status: Home / Self Care | Payer: Medicare Other | Source: Ambulatory Visit | Attending: Internal Medicine | Admitting: Internal Medicine

## 2022-06-29 ENCOUNTER — Ambulatory Visit: Payer: Medicare Other | Admitting: Nurse Practitioner

## 2022-06-29 DIAGNOSIS — R911 Solitary pulmonary nodule: Secondary | ICD-10-CM | POA: Insufficient documentation

## 2022-06-29 DIAGNOSIS — K7460 Unspecified cirrhosis of liver: Secondary | ICD-10-CM | POA: Insufficient documentation

## 2022-06-29 DIAGNOSIS — K8020 Calculus of gallbladder without cholecystitis without obstruction: Secondary | ICD-10-CM | POA: Diagnosis not present

## 2022-06-29 DIAGNOSIS — Z01818 Encounter for other preprocedural examination: Secondary | ICD-10-CM | POA: Diagnosis not present

## 2022-06-29 DIAGNOSIS — J439 Emphysema, unspecified: Secondary | ICD-10-CM | POA: Diagnosis not present

## 2022-06-29 DIAGNOSIS — R918 Other nonspecific abnormal finding of lung field: Secondary | ICD-10-CM | POA: Diagnosis not present

## 2022-06-29 DIAGNOSIS — I700 Atherosclerosis of aorta: Secondary | ICD-10-CM | POA: Diagnosis not present

## 2022-06-29 LAB — GLUCOSE, CAPILLARY: Glucose-Capillary: 102 mg/dL — ABNORMAL HIGH (ref 70–99)

## 2022-06-29 MED ORDER — FLUDEOXYGLUCOSE F - 18 (FDG) INJECTION
10.8800 | Freq: Once | INTRAVENOUS | Status: AC | PRN
  Administered 2022-06-29: 10.88 via INTRAVENOUS

## 2022-06-30 ENCOUNTER — Telehealth: Payer: Self-pay

## 2022-06-30 ENCOUNTER — Telehealth: Payer: Self-pay | Admitting: Family

## 2022-06-30 ENCOUNTER — Ambulatory Visit: Payer: Medicare Other | Admitting: Family

## 2022-06-30 NOTE — Progress Notes (Deleted)
Patient ID: [name], female    DOB: 10-27-1945, 76 y.o.   MRN: [id number]  HPI  [name] is a 76 y/o female with a history of hyperlipidemia, HTN, thyroid disease, COPD, left ear deafness, atrial fibrillation, previous tobacco use and chronic heart failure.   Echo report from 11/11/21 reviewed and showed an EF of 60-65%. Echo report from 05/16/21 reviewed and showed an EF of 45% along with mild LVH. Echo report from 02/07/20 reviewed and showed an EF of >55% along with severe LAE. Catheterization done 02/07/20 but unable to view the results.   Admitted 01/25/22 due to pneumonia where she was treated with antibiotics and discharged after 2 days.   She presents today for a follow-up visit with a chief complaint of   Past Medical History:  Diagnosis Date   Aortic 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 s/p bioprosthetic valve replacement 2008 at [name];  b. 01/2015 Echo: EF 60-65%, no rwma, Gr1 DD, mildly dil LA, nl RV fxn.   Aspiration pneumonia ([name])    Atrial fibrillation with RVR ([name]) 01/11/2015   Atrial flutter ([name])    a. 08/2016 s/p DCCV.  Remains on flecainide 50 mg bid.   Basal skull fracture ([name]) 20 yrs ago   CHF (congestive heart failure) ([name])    Chronic respiratory failure ([name])    COPD (chronic obstructive pulmonary disease) ([name])    a. 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 2008 prior to Aortic aneurysm repair-->nl cors.   History of stress test    a. 10/2015 MV: no ischemia/infarct.   HLD (hyperlipidemia)    HTN (hypertension)    Hypothyroidism    Obesity    PAF (paroxysmal atrial fibrillation) ([name])    a. on Eliquis; b. CHADS2VASc = 3 (HTN, age x 36, female).   Paroxysmal atrial fibrillation ([name]) 01/24/2015   Right upper quadrant abdominal tenderness without rebound tenderness 03/02/2018   S/P ascending aortic aneurysm repair 2008   Past  Surgical History:  Procedure Laterality Date   ABDOMINAL AORTIC ANEURYSM REPAIR  2008   ABDOMINAL HYSTERECTOMY     AORTIC VALVE REPLACEMENT  2008   CARDIAC CATHETERIZATION     [facility]   CARDIOVERSION N/A 08/15/2018   Procedure: CARDIOVERSION;  Surgeon: Andrea Hampshire, MD;  Location: [facility];  Service: Cardiovascular;  Laterality: N/A;   CARDIOVERSION N/A 11/14/2018   Procedure: CARDIOVERSION (CATH LAB);  Surgeon: Andrea Hampshire, MD;  Location: [facility];  Service: Cardiovascular;  Laterality: N/A;   CARDIOVERSION N/A 06/05/2019   Procedure: CARDIOVERSION;  Surgeon: Andrea Hampshire, MD;  Location: [facility];  Service: Cardiovascular;  Laterality: N/A;   CARDIOVERSION N/A 08/14/2019   Procedure: CARDIOVERSION;  Surgeon: Andrea Hampshire, MD;  Location: [place] [facility];  Service: Cardiovascular;  Laterality: N/A;   CARDIOVERSION N/A 11/09/2019   Procedure: CARDIOVERSION;  Surgeon: Isaias Cowman, MD;  Location: [place] [facility];  Service: Cardiovascular;  Laterality: N/A;   CARDIOVERSION N/A 01/17/2020   Procedure: CARDIOVERSION;  Surgeon: Isaias Cowman, MD;  Location: [facility];  Service: Cardiovascular;  Laterality: N/A;   CARPAL TUNNEL RELEASE     ELECTROPHYSIOLOGIC STUDY N/A 08/17/2016   Procedure: Cardioversion;  Surgeon: Andrea Hampshire, MD;  Location: [facility];  Service: Cardiovascular;  Laterality: N/A;   TUMOR EXCISION Left  x3 (arm)   Family History  Problem Relation Age of Onset   Stroke Father    Stroke Paternal Grandmother    Social History   Tobacco Use   Smoking status: Former    Types: Cigarettes   Smokeless tobacco: Never  Substance Use Topics   Alcohol use: No   Allergies  Allergen Reactions   Benadryl [Diphenhydramine Hcl (Sleep)] Palpitations   Cetirizine Palpitations   Lasix [Furosemide] Rash   Levaquin [Levofloxacin In D5w] Other (See Comments)    Reaction:  Fatigue and muscle soreness   Meloxicam Rash   Soy Allergy Hives and Nausea And Vomiting   Sulfa  Antibiotics Rash    Review of Systems  Constitutional:  Positive for appetite change (decreased) and fatigue.  Eyes: Negative.   Respiratory:  Positive for shortness of breath.   Cardiovascular:  Positive for chest pain (intermittent). Negative for palpitations and leg swelling.  Gastrointestinal:  Negative for abdominal distention and abdominal pain.  Endocrine: Negative.   Genitourinary: Negative.   Musculoskeletal:  Positive for arthralgias (bilateral shoulder pain) and back pain (right lower back).  Skin: Negative.   Allergic/Immunologic: Negative.   Neurological:  Positive for light-headedness (at times). Negative for dizziness.  Hematological:  Negative for adenopathy. Does not bruise/bleed easily.  Psychiatric/Behavioral:  Positive for sleep disturbance (trouble sleeping due to shoulder pain; wearing cpap at bedtime). Negative for dysphoric mood. The patient is not nervous/anxious.       Physical Exam Vitals and nursing note reviewed.  Constitutional:      Appearance: Normal appearance.  HENT:     Head: Normocephalic and atraumatic.     Left Ear: Decreased hearing noted.  Cardiovascular:     Rate and Rhythm: Normal rate and regular rhythm.  Pulmonary:     Effort: Pulmonary effort is normal. No tachypnea or respiratory distress.     Breath sounds: No wheezing or rales.  Abdominal:     General: There is no distension.     Palpations: Abdomen is soft.  Musculoskeletal:     Cervical back: Normal range of motion and neck supple.     Right lower leg: No edema.     Left lower leg: No edema.  Skin:    General: Skin is warm and dry.  Neurological:     General: No focal deficit present.     Mental Status: She is alert and oriented to person, place, and time.  Psychiatric:        Mood and Affect: Mood is not anxious.        Behavior: Behavior normal.        Thought Content: Thought content normal.    Assessment & Plan:  1: Chronic heart failure with now preserved  ejection fraction- - NYHA class II - euvolemic today - weighing daily; reminded to call for an overnight weight gain of >2 pounds or a weekly weight gain of >5 pounds - weight 184 pounds from last visit here 2 months ago - not adding salt and is using pepper for seasoning; reviewed the importance of closely following a low sodium diet  - had telemedicine visit with cardiothoracic provider ([place]) 07/23/21 for aortic valve - on GDMT of lisinopril & carvedilol & jardiance - discussed her back pain since jardiance started; advised her to hold the jardiance for a few days and if symptoms improve, resume jardiance to see if back pain returns - if after stopping jardiance, back pain continues, she is to resume jardiance - given patient  assistance forms for jardiance and eliquis and patient says she will return the forms at a later date - has had previous valve surgery  - BNP 01/25/22 was 127.6  2: HTN- - BP  - saw PCP Stephanie Coup) 06/18/22 - BMP 05/05/22 reviewed and showed sodium 140, potassium 3.6, creatinine 0.92 and GFR >60  3: severe COPD- - wearing oxygen at 4L around the clock at home; when she comes to appointments, she has to put her portable tank on 3L/ this tank is pulse and she doesn't like it as much as her continuous oxygen at home - pulse ox on 3L was 96% - saw pulmonology Humphrey Rolls) 06/11/22  4: Atrial fibrillation-  - 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 Each medication was verbally reviewed with the patient and she was encouraged to bring the bottles to every visit to confirm accuracy of list.

## 2022-06-30 NOTE — Telephone Encounter (Signed)
Patient did not show for her [facility] Failure [facility] appointment on 06/30/22. Will attempt to reschedule.

## 2022-07-01 ENCOUNTER — Telehealth: Payer: Self-pay

## 2022-07-01 NOTE — Telephone Encounter (Signed)
LM  to call office for pre virtual appointment questions 

## 2022-07-01 NOTE — Progress Notes (Unsigned)
Patient: [name]  Service Category: E/M  Provider: Gaspar Cola, MD  DOB: June 30, 1946  DOS: 07/02/2022  Location: Office  MRN: [id number]  Setting: Ambulatory outpatient  Referring Provider: [name], MD  Type: Established Patient  Specialty: Interventional Pain Management  PCP: [name], MD  Location: Remote location  Delivery: TeleHealth     Virtual Encounter - Pain Management PROVIDER NOTE: Information contained herein reflects review and annotations entered in association with encounter. Interpretation of such information and data should be left to medically-trained personnel. Information provided to patient can be located elsewhere in the medical record under "Patient Instructions". Document created using STT-dictation technology, any transcriptional errors that may result from process are unintentional.    Contact & Pharmacy Preferred: [phone] Home: [phone] (home) Mobile: [phone] (mobile) E-mail: [email or web address] .[id number] [address], [address] [place] [address] Phone: [phone] Fax: [place], [place] - [place] [address] Phone: [phone] Fax: [phone]  [place], [place] [place] [place] [address] Phone: [phone] Fax: [phone]   Pre-screening  [name] offered "in-person" vs "virtual" encounter. She indicated preferring virtual for this encounter.   Reason COVID-19*  Social distancing based on CDC and AMA recommendations.   I contacted [name] on 07/02/2022 via telephone.      I clearly identified myself as Gaspar Cola, MD. I verified that I was speaking with the correct person using two identifiers (Name: [name], and date of birth:  [birth year]).  Consent I sought verbal advanced consent from [name] for virtual visit interactions. I informed [name] of possible security and privacy concerns, risks, and limitations associated with providing "not-in-person" medical evaluation and management services. I also informed [name] of the availability of "in-person" appointments. Finally, I informed her that there would be a charge for the virtual visit and that she could be  personally, fully or partially, financially responsible for it. Ms. Gough expressed understanding and agreed to proceed.   Historic Elements   [name] is a 76 y.o. year old, female patient evaluated today after our last contact on 06/16/2022. [name]  has a past medical history of Aortic stenosis due to bicuspid aortic valve, Aspiration pneumonia ([place]), Atrial fibrillation with RVR ([place]) (01/11/2015), Atrial flutter ([place]), Basal skull fracture ([name]) (20 yrs ago), CHF (congestive heart failure) ([place]), Chronic respiratory failure ([place]), COPD (chronic obstructive pulmonary disease) ([place]), Deafness in left ear, Essential hypertension (01/24/2015), History of cardiac cath, History of stress test, HLD (hyperlipidemia), HTN (hypertension), Hypothyroidism, Obesity, PAF (paroxysmal atrial fibrillation) ([employer]), Paroxysmal atrial fibrillation ([place]) (01/24/2015), Right upper quadrant abdominal tenderness without rebound tenderness (03/02/2018), and S/P ascending aortic aneurysm repair (2008). She also  has a past surgical history that includes Abdominal aortic aneurysm repair (2008); Aortic valve replacement (2008); Abdominal hysterectomy; Carpal tunnel release; Tumor excision (Left); Cardiac catheterization; Cardiac catheterization (N/A, 08/17/2016); CARDIOVERSION (N/A, 08/15/2018); CARDIOVERSION (N/A, 11/14/2018); Cardioversion (N/A, 06/05/2019); Cardioversion (N/A, 08/14/2019); Cardioversion (N/A, 11/09/2019); and Cardioversion (N/A, 01/17/2020). [name] has a current  medication list which includes the following prescription(s): alprazolam, amlodipine, apixaban, atorvastatin, breztri aerosphere, bumetanide, calcium carbonate-vitamin d, carvedilol, coq10, diclofenac sodium, empagliflozin, ferrous sulfate, flecainide, ipratropium-albuterol, levothyroxine, lisinopril, loratadine, oxycodone-acetaminophen, pantoprazole, potassium chloride, sertraline,  sodium chloride, trazodone, and ventolin hfa. She  reports that she has quit smoking. Her smoking use included cigarettes. She has never used smokeless tobacco. She reports that she does not drink alcohol and does not use drugs. [name] is allergic to benadryl [diphenhydramine hcl (sleep)], cetirizine, lasix [furosemide], levaquin [levofloxacin in d5w], meloxicam, soy allergy, and sulfa antibiotics.   HPI  Today, she is being contacted for a post-procedure assessment.  Post-procedure evaluation    Procedure:          Anesthesia, Analgesia, Anxiolysis:  Type: Trapezius and supraspinatus muscle Trigger Point Injection (1-2 muscle groups) #2  CPT: 20552 Primary Purpose: Therapeutic Region: Upper Cervicothoracic and shoulder Level: Cervico-thoracic/shoulder Target Area: Trapezius and supraspinatus muscle Trigger Point Approach: Percutaneous, ipsilateral approach. Laterality: Bilateral        Type: Local Anesthesia Local Anesthetic: Lidocaine 1-2% Sedation: None  Indication(s):  Analgesia Route: Infiltration (/IM) IV Access: N/A   Position: Sitting   1. Chronic myofascial pain   2. Musculoskeletal disorder involving upper trapezius muscle   3. Trigger point of shoulder region, unspecified laterality   4. Trigger point with neck pain   5. Chronic shoulder pain (1ry area of Pain) (Bilateral) (L>R)   6. Chronic neck pain (3ry area of Pain) (Posterior) (Bilateral) (L>R)   7. Chronic upper back pain   8. Shoulder blade pain (4th area of Pain) (Left)   9. DISH (diffuse idiopathic skeletal hyperostosis)     NAS-11 Pain score:   Pre-procedure: 7 /10   Post-procedure: 3  (3 ON THE RIGHT, NO CHANGE ON THE LEFT 8)/10      Effectiveness:  Initial hour after procedure: 0 %. Subsequent 4-6 hours post-procedure: 0 %. Analgesia past initial 6 hours:  (right  95%  left 85%). Ongoing improvement:  Analgesic: According to the patient she has attained an ongoing 95% relief of the pain in the right shoulder region while getting an ongoing 85% on the left.  She is very happy with the results. Function: [name] reports improvement in function ROM: [name] reports improvement in ROM  Pharmacotherapy Assessment   Opioid Analgesic: Oxycodone/APAP 7.5/325, 1 tab p.o. daily (# 45) (last filled on 06/02/2021) by Erich Montane, FNP-C, working for Dr. Talmadge Chad, MD. MME/day: 7.5 mg/day   Monitoring: [place] PMP: PDMP reviewed during this encounter.       Pharmacotherapy: No side-effects or adverse reactions reported. Compliance: No problems identified. Effectiveness: Clinically acceptable. Plan: Refer to "POC". UDS:  Summary  Date Value Ref Range Status  04/30/2021 Note  Final    Comment:    ==================================================================== Compliance Drug Analysis, Ur ==================================================================== Test                             Result       Flag       Units  Drug Present and Declared for Prescription Verification   Alpha-hydroxyalprazolam        107          EXPECTED   ng/mg creat    Alpha-hydroxyalprazolam is an expected metabolite of alprazolam.    Source of alprazolam is a scheduled prescription medication.    Oxycodone                      1371         EXPECTED   ng/mg creat   Oxymorphone  600          EXPECTED   ng/mg creat   Noroxycodone                   2311         EXPECTED   ng/mg creat    Sources of oxycodone include scheduled prescription medications.    Oxymorphone and noroxycodone are  expected metabolites of oxycodone.    Oxymorphone is also available as a scheduled prescription medication.    Sertraline                     PRESENT      EXPECTED   Desmethylsertraline            PRESENT      EXPECTED    Desmethylsertraline is an expected metabolite of sertraline.    Trazodone                      PRESENT      EXPECTED   1,3 chlorophenyl piperazine    PRESENT      EXPECTED    1,3-chlorophenyl piperazine is an expected metabolite of trazodone.    Acetaminophen                  PRESENT      EXPECTED  Drug Absent but Declared for Prescription Verification   Diclofenac                     Not Detected UNEXPECTED    Diclofenac, as indicated in the declared medication list, is not    always detected even when used as directed.  ==================================================================== Test                      Result    Flag   Units      Ref Range   Creatinine              45               mg/dL      >=20 ==================================================================== Declared Medications:  The flagging and interpretation on this report are based on the  following declared medications.  Unexpected results may arise from  inaccuracies in the declared medications.   **Note: The testing scope of this panel includes these medications:   Alprazolam (Xanax)  Oxycodone (Percocet)  Sertraline (Zoloft)  Trazodone (Desyrel)   **Note: The testing scope of this panel does not include small to  moderate amounts of these reported medications:   Acetaminophen (Tylenol)  Acetaminophen (Percocet)  Diclofenac (Voltaren)   **Note: The testing scope of this panel does not include the  following reported medications:   Albuterol (Ventolin HFA)  Apixaban (Eliquis)  Atorvastatin (Lipitor)  Bumetanide (Bumex)  Calcium  Flecainide (Tambocor)  Fluticasone (Trelegy)  Iron  Levothyroxine (Synthroid)  Lisinopril (Zestril)  Loratadine (Claritin)  Pantoprazole  (Protonix)  Potassium (Klor-Con)  Ubiquinone (CoQ10)  Umeclidinium (Trelegy)  Vilanterol (Trelegy)  Vitamin D ==================================================================== For clinical consultation, please call [phone]. ====================================================================    No results found for: "CBDTHCR", "D8THCCBX", "D9THCCBX"   Laboratory Chemistry Profile   Renal Lab Results  Component Value Date   BUN 22 05/05/2022   CREATININE 0.92 05/05/2022   BCR 21 03/20/2022   GFRAA >60 05/14/2020   GFRNONAA >60 05/05/2022    Hepatic Lab Results  Component Value Date   AST 21 03/20/2022   ALT 13 03/20/2022  ALBUMIN 4.2 03/20/2022   ALKPHOS 113 03/20/2022   LIPASE 124 10/16/2011    Electrolytes Lab Results  Component Value Date   NA 140 05/05/2022   K 3.6 05/05/2022   CL 102 05/05/2022   CALCIUM 9.2 05/05/2022   MG 2.0 12/03/2021   PHOS 3.5 01/30/2021    Bone Lab Results  Component Value Date   VD25OH 25.5 (L) 11/25/2020   25OHVITD1 31 04/30/2021   25OHVITD2 <1.0 04/30/2021   25OHVITD3 31 04/30/2021    Inflammation (CRP: Acute Phase) (ESR: Chronic Phase) Lab Results  Component Value Date   CRP 6 04/30/2021   ESRSEDRATE 40 04/30/2021   LATICACIDVEN 1.5 01/25/2022         Note: Above Lab results reviewed.  Imaging  NM PET Image Initial (PI) Skull Base To Thigh (F-18 FDG) CLINICAL DATA:  Initial treatment strategy for lung nodule.  EXAM: NUCLEAR MEDICINE PET SKULL BASE TO THIGH  TECHNIQUE: 10.9 mCi F-18 FDG was injected intravenously. Full-ring PET imaging was performed from the skull base to thigh after the radiotracer. CT data was obtained and used for attenuation correction and anatomic localization.  Fasting blood glucose: 102 mg/dl  COMPARISON:  CT chest 06/02/2022, 01/01/2022, 10/17/2021 and 05/11/2020.  FINDINGS: Mediastinal blood pool activity: SUV max 2.2  Liver activity: SUV max NA  NECK:  No abnormal  hypermetabolism.  Incidental CT findings:  None.  CHEST:  8 mm apical segment right upper lobe nodule (4 x 12 mm, 2/64), SUV max 1.6. Nodule is minimally increased from 8 mm on 11/14/2021 and is new from 05/11/2020. Subpleural ground-glass and consolidation in the right middle and right lower lobes, slightly increased from 06/02/2022, with expected hypermetabolism. No hypermetabolic lymph nodes. Hypermetabolism associated with surgical clips in the right subpectoral region. No soft tissue CT correlate.  Incidental CT findings:  Atherosclerotic calcification of the aorta. Aortic valve replacement. Heart is enlarged. No pericardial or pleural effusion. Centrilobular emphysema. Dependent atelectasis in the lingula and left lower lobe.  ABDOMEN/PELVIS:  No abnormal hypermetabolism in the liver, adrenal glands, spleen or pancreas. No hypermetabolic lymph nodes.  Incidental CT findings:  Liver margin is irregular. Stones in the gallbladder. Adrenal glands are unremarkable. Probable renal vascular calcifications on the right. Kidneys, spleen, pancreas stomach and bowel are grossly unremarkable. Atherosclerotic calcification of the aorta. Probable Bartholin cyst on the right.  SKELETON:  No abnormal hypermetabolism  Incidental CT findings:  Degenerative changes in the spine.  IMPRESSION: 1. Apical segment right upper lobe nodule is borderline in size for PET resolution but is visualized, is minimally larger than on 11/14/2021 and is new from 05/11/2020. 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 2. Right basilar subpleural ground-glass and consolidation, minimally increased and associated hypermetabolism. Findings may be due to atelectasis superimposed on interstitial lung disease, better evaluated on 06/02/2022. 3. Cirrhosis. 4. Cholelithiasis. 5.  Aortic atherosclerosis (ICD10-I70.0). 6.  Emphysema (ICD10-J43.9).  Electronically Signed   By:  Lorin Picket M.D.   On: 06/30/2022 11:31  Assessment  The primary encounter diagnosis was Chronic shoulder pain (1ry area of Pain) (Bilateral) (L>R). Diagnoses of Musculoskeletal disorder involving upper trapezius muscle, Trigger point of shoulder region, unspecified laterality, and Trigger point with neck pain were also pertinent to this visit.  Plan of Care  Problem-specific:  No problem-specific Assessment & Plan notes found for this encounter.  [name] has a current medication list which includes the following long-term medication(s): apixaban, atorvastatin, bumetanide, calcium carbonate-vitamin d, carvedilol, ferrous  sulfate, ipratropium-albuterol, levothyroxine, lisinopril, pantoprazole, potassium chloride, sertraline, trazodone, and ventolin hfa.  Pharmacotherapy (Medications Ordered): No orders of the defined types were placed in this encounter.  Orders:  No orders of the defined types were placed in this encounter.  Follow-up plan:   No follow-ups on file.     Interventional Therapies  Risk  Complexity Considerations:   Estimated body mass index is 38.87 kg/m as calculated from the following:   Height as of this encounter: 4' 10" (1.473 m).   Weight as of this encounter: 186 lb (84.4 kg). 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 DISH: (Left C7-T1 calcified ligamentum flavum) (see 10/04/2021 cervical CT Se: 7 Im: 39/81; Se: 9 Im: 56/88; Se: 11 Im: 76/88; Se: 5 Im: 63/76)   Planned  Pending:      Under consideration:   Palliative bilateral trapezius and supraspinatus m. MNB/TPI #3    Completed:   Diagnostic bilateral suprascapular NB x2 (09/25/2021) (100/100/100/100 x 2.5 months)  Therapeutic bilateral Trapezius and supraspinatus MNB x2 (06/16/2022)  (100/100/90/90)    Therapeutic  Palliative (PRN) options:   Therapeutic upper back and shoulder (trapezius & supraspinatus) MNB/TPI #2      Recent Visits Date Type Provider Dept  06/16/22 Procedure visit Milinda Pointer, MD [facility]  04/28/22 Office Visit Milinda Pointer, MD [facility]  04/14/22 Procedure visit Milinda Pointer, MD [facility]  Showing recent visits within past 90 days and meeting all other requirements Today's Visits Date Type Provider Dept  07/02/22 Office Visit Milinda Pointer, MD [facility]  Showing today's visits and meeting all other requirements Future Appointments No visits were found meeting these conditions. Showing future appointments within next 90 days and meeting all other requirements  I discussed the assessment and treatment plan with the patient. The patient was provided an opportunity to ask questions and all were answered. The patient agreed with the plan and demonstrated an understanding of the instructions.  Patient advised to call back or seek an in-person evaluation if the symptoms or condition worsens.  Duration of encounter: 12 minutes.  Note by: Gaspar Cola, MD Date: 07/02/2022; Time: 3:33 PM

## 2022-07-02 ENCOUNTER — Other Ambulatory Visit: Payer: Self-pay | Admitting: Nurse Practitioner

## 2022-07-02 ENCOUNTER — Ambulatory Visit: Payer: Medicare Other | Attending: Pain Medicine | Admitting: Pain Medicine

## 2022-07-02 ENCOUNTER — Telehealth: Payer: Self-pay | Admitting: Internal Medicine

## 2022-07-02 ENCOUNTER — Telehealth: Payer: Self-pay

## 2022-07-02 DIAGNOSIS — M542 Cervicalgia: Secondary | ICD-10-CM

## 2022-07-02 DIAGNOSIS — M25512 Pain in left shoulder: Secondary | ICD-10-CM

## 2022-07-02 DIAGNOSIS — M629 Disorder of muscle, unspecified: Secondary | ICD-10-CM

## 2022-07-02 DIAGNOSIS — G8929 Other chronic pain: Secondary | ICD-10-CM | POA: Diagnosis not present

## 2022-07-02 DIAGNOSIS — M25511 Pain in right shoulder: Secondary | ICD-10-CM

## 2022-07-02 DIAGNOSIS — M25519 Pain in unspecified shoulder: Secondary | ICD-10-CM

## 2022-07-02 DIAGNOSIS — G4733 Obstructive sleep apnea (adult) (pediatric): Secondary | ICD-10-CM | POA: Diagnosis not present

## 2022-07-02 MED ORDER — OXYCODONE-ACETAMINOPHEN 7.5-325 MG PO TABS: ORAL_TABLET | ORAL | 0 refills | Status: DC

## 2022-07-02 NOTE — Telephone Encounter (Signed)
Error

## 2022-07-02 NOTE — Telephone Encounter (Signed)
Patient called again requesting her Oxycodone, I let her know [name] has sent her medication in 30 minutes ago. Advised patient to wait a few more minutes before it is ready at the pharmacy.

## 2022-07-02 NOTE — Patient Instructions (Signed)
____________________________________________________________________________________________  Patient Information update  To: All of our patients.  Re: Name change.  It has been made official that our current name, "[facility] [facility]"   will soon be changed to "[facility]".   The purpose of this change is to eliminate any confusion created by the concept of our practice being a "Medication Management Pain Clinic". In the past this has led to the misconception that we treat pain primarily by the use of prescription medications.  Nothing can be farther from the truth.   Understanding PAIN MANAGEMENT: To further understand what our practice does, you first have to understand that "Pain Management" is a subspecialty that requires additional training once a physician has completed their specialty training, which can be in either Anesthesia, Neurology, Psychiatry, or Physical Medicine and Rehabilitation (PMR). Each one of these contributes to the final approach taken by each physician to the management of their patient's pain. To be a "Pain Management Specialist" you must have first completed one of the specialty trainings below.  Anesthesiologists - trained in clinical pharmacology and interventional techniques such as nerve blockade and regional as well as central neuroanatomy. They are trained to block pain before, during, and after surgical interventions.  Neurologists - trained in the diagnosis and pharmacological treatment of complex neurological conditions, such as Multiple Sclerosis, Parkinson's, spinal cord injuries, and other systemic conditions that may be associated with symptoms that may include but are not limited to pain. They tend to rely primarily on the treatment of chronic pain using prescription medications.  Psychiatrist - trained in conditions affecting the psychosocial wellbeing  of patients including but not limited to depression, anxiety, schizophrenia, personality disorders, addiction, and other substance use disorders that may be associated with chronic pain. They tend to rely primarily on the treatment of chronic pain using prescription medications.   Physical Medicine and Rehabilitation (PMR) physicians, also known as physiatrists - trained to treat a wide variety of medical conditions affecting the brain, spinal cord, nerves, bones, joints, ligaments, muscles, and tendons. Their training is primarily aimed at treating patients that have suffered injuries that have caused severe physical impairment. Their training is primarily aimed at the physical therapy and rehabilitation of those patients. They may also work alongside orthopedic surgeons or neurosurgeons using their expertise in assisting surgical patients to recover after their surgeries.  INTERVENTIONAL PAIN MANAGEMENT is sub-subspecialty of Pain Management.  Our physicians are Board-certified in Anesthesia, Pain Management, and Interventional Pain Management.  This meaning that not only have they been trained and Board-certified in their specialty of Anesthesia, and subspecialty of Pain Management, but they have also received further training in the sub-subspecialty of Interventional Pain Management, in order to become Board-certified as INTERVENTIONAL PAIN MANAGEMENT SPECIALIST.    Mission: Our goal is to use our skills in  [place] as alternatives to the chronic use of prescription opioid medications for the treatment of pain. To make this more clear, we have changed our name to reflect what we do and offer. We will continue to offer medication management assessment and recommendations, but we will not be taking over any patient's medication management.  ____________________________________________________________________________________________

## 2022-07-06 ENCOUNTER — Encounter: Payer: Self-pay | Admitting: Internal Medicine

## 2022-07-06 ENCOUNTER — Ambulatory Visit: Payer: Medicare Other | Admitting: Internal Medicine

## 2022-07-06 VITALS — BP 126/61 | HR 72 | Temp 97.7°F | Resp 16 | Ht <= 58 in | Wt 189.2 lb

## 2022-07-06 DIAGNOSIS — I5032 Chronic diastolic (congestive) heart failure: Secondary | ICD-10-CM | POA: Diagnosis not present

## 2022-07-06 DIAGNOSIS — R911 Solitary pulmonary nodule: Secondary | ICD-10-CM | POA: Diagnosis not present

## 2022-07-06 DIAGNOSIS — J9611 Chronic respiratory failure with hypoxia: Secondary | ICD-10-CM

## 2022-07-06 DIAGNOSIS — J849 Interstitial pulmonary disease, unspecified: Secondary | ICD-10-CM

## 2022-07-06 DIAGNOSIS — J449 Chronic obstructive pulmonary disease, unspecified: Secondary | ICD-10-CM | POA: Diagnosis not present

## 2022-07-06 NOTE — Progress Notes (Signed)
[facility] [place], [place] [address]  Pulmonary Sleep Medicine   Office Visit Note  Patient Name: [name] DOB: 02/05/1946 MRN [id number]  Date of Service: 07/06/2022  Complaints/HPI: RUL nodule post PET scan.  She had the PET scan done which showed apical right upper lobe nodule which was borderline in size for PET resolution which was found to be minimally larger than in March and is new from September 2021 and was concerning for stage I bronchogenic carcinoma.  Patient also in addition had some groundglass and consolidations noted could be reflective of atelectasis versus interstitial lung disease.  I discussed the findings with Mr. Krupka at length.  The options at this time are to watch and see versus moving more aggressively if this is a stage I cancer then it would be worthwhile to consider surgical resection.  I would favor at least an evaluation by cardiothoracic surgery to assess these possibilities.  ROS  General: (-) fever, (-) chills, (-) night sweats, (-) weakness Skin: (-) rashes, (-) itching,. Eyes: (-) visual changes, (-) redness, (-) itching. Nose and Sinuses: (-) nasal stuffiness or itchiness, (-) postnasal drip, (-) nosebleeds, (-) sinus trouble. Mouth and Throat: (-) sore throat, (-) hoarseness. Neck: (-) swollen glands, (-) enlarged thyroid, (-) neck pain. Respiratory: + cough, (-) bloody sputum, + shortness of breath, + wheezing. Cardiovascular: - ankle swelling, (-) chest pain. Lymphatic: (-) lymph node enlargement. Neurologic: (-) numbness, (-) tingling. Psychiatric: (-) anxiety, (-) depression   Current Medication: Outpatient Encounter Medications as of 07/06/2022  Medication Sig   ALPRAZolam (XANAX) 0.25 MG tablet TAKE 1 TABLET(0.25 MG) BY MOUTH TWICE DAILY AS NEEDED FOR ANXIETY   amLODipine (NORVASC) 10 MG tablet Take 1 tablet by mouth daily.   apixaban (ELIQUIS) 5 MG TABS tablet Take 1 tablet (5 mg total) by mouth 2  (two) times daily.   atorvastatin (LIPITOR) 40 MG tablet TAKE 1 TABLET BY MOUTH EVERY NIGHT AT BEDTIME   Budeson-Glycopyrrol-Formoterol (BREZTRI AEROSPHERE) 160-9-4.8 MCG/ACT AERO Inhale 2 puffs into the lungs 2 (two) times daily.   bumetanide (BUMEX) 1 MG tablet Take 2 tablets (2 mg total) by mouth daily. And additional 2 tablets PM PRN   Calcium Carbonate-Vitamin D (CALCIUM 600+D PO) Take 1 tablet by mouth daily.   carvedilol (COREG) 6.25 MG tablet Take 1 tablet (6.25 mg total) by mouth 2 (two) times daily with a meal.   Coenzyme Q10 (COQ10) 200 MG CAPS Take 200 mg by mouth daily.   diclofenac Sodium (VOLTAREN) 1 % GEL SMARTSIG:Gram(s) Topical Twice Daily   empagliflozin (JARDIANCE) 10 MG TABS tablet Take 1 tablet (10 mg total) by mouth daily before breakfast.   ferrous sulfate 325 (65 FE) MG tablet Take 1 tablet (325 mg total) by mouth daily with breakfast.   flecainide (TAMBOCOR) 50 MG tablet Take 50 mg by mouth daily.   ipratropium-albuterol (DUONEB) 0.5-2.5 (3) MG/3ML SOLN Inhale 3 mLs into the lungs every 4 (four) hours as needed. Inhale 3 mls into the lungs every 4 to 6 hours and as needed   levothyroxine (SYNTHROID) 25 MCG tablet TAKE 1 TABLET BY MOUTH DAILY BEFORE BREAKFAST   lisinopril (ZESTRIL) 40 MG tablet TAKE 1 TABLET(40 MG) BY MOUTH DAILY(DOSE INCREASE)   loratadine (CLARITIN) 10 MG tablet Take 10 mg by mouth daily.   oxyCODONE-acetaminophen (PERCOCET) 7.5-325 MG tablet Take one tab po bid for pain severe   pantoprazole (PROTONIX) 40 MG tablet TAKE 1 TABLET BY MOUTH EVERY DAY  potassium chloride (KLOR-CON) 10 MEQ tablet Take 4 tablets (40 mEq total) by mouth 2 (two) times daily.   sertraline (ZOLOFT) 100 MG tablet Take one tab a day for Anxiety   sodium chloride (OCEAN) 0.65 % SOLN nasal spray Place 1 spray into both nostrils as needed for congestion.   traZODone (DESYREL) 50 MG tablet Take 1 tablet (50 mg total) by mouth at bedtime.   VENTOLIN HFA 108 (90 Base) MCG/ACT inhaler  INHALE 2 PUFFS INTO THE LUNGS EVERY 6 HOURS AS NEEDED FOR WHEEZING OR SHORTNESS OF BREATH   No facility-administered encounter medications on file as of 07/06/2022.    Surgical History: Past Surgical History:  Procedure Laterality Date   ABDOMINAL AORTIC ANEURYSM REPAIR  2008   ABDOMINAL HYSTERECTOMY     AORTIC VALVE REPLACEMENT  2008   CARDIAC CATHETERIZATION     [place]   CARDIOVERSION N/A 08/15/2018   Procedure: CARDIOVERSION;  Surgeon: [name], MD;  Location: [facility] ORS;  Service: Cardiovascular;  Laterality: N/A;   CARDIOVERSION N/A 11/14/2018   Procedure: CARDIOVERSION (CATH LAB);  Surgeon: [name], MD;  Location: [facility] ORS;  Service: Cardiovascular;  Laterality: N/A;   CARDIOVERSION N/A 06/05/2019   Procedure: CARDIOVERSION;  Surgeon: [name], MD;  Location: [facility] ORS;  Service: Cardiovascular;  Laterality: N/A;   CARDIOVERSION N/A 08/14/2019   Procedure: CARDIOVERSION;  Surgeon: [name], MD;  Location: [facility];  Service: Cardiovascular;  Laterality: N/A;   CARDIOVERSION N/A 11/09/2019   Procedure: CARDIOVERSION;  Surgeon: Isaias Cowman, MD;  Location: [facility] OR[facility];  Service: Cardiovascular;  Laterality: N/A;   CARDIOVERSION N/A 01/17/2020   Procedure: CARDIOVERSION;  Surgeon: Isaias Cowman, MD;  Location: [facility] OR[facility];  Service: Cardiovascular;  Laterality: N/A;   CARPAL TUNNEL RELEASE     ELECTROPHYSIOLOGIC STUDY N/A 08/17/2016   Procedure: Cardioversion;  Surgeon: [name], MD;  Location: [facility] ORS;  Service: Cardiovascular;  Laterality: N/A;   TUMOR EXCISION Left    x3 (arm)    Medical History: Past Medical History:  Diagnosis Date   Aortic 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 s/p bioprosthetic valve replacement 2008 at [facility];  b. 01/2015 Echo: EF 60-65%, no rwma, Gr1 DD, mildly dil LA, nl RV fxn.   Aspiration pneumonia ([place])    Atrial fibrillation with RVR ([place]) 01/11/2015   Atrial flutter ([place])    a. 08/2016 s/p DCCV.   Remains on flecainide 50 mg bid.   Basal skull fracture ([name]) 20 yrs ago   CHF (congestive heart failure) ([name])    Chronic respiratory failure ([name])    COPD (chronic obstructive pulmonary disease) ([place])    a. 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 2008 prior to Aortic aneurysm repair-->nl cors.   History of stress test    a. 10/2015 MV: no ischemia/infarct.   HLD (hyperlipidemia)    HTN (hypertension)    Hypothyroidism    Obesity    PAF (paroxysmal atrial fibrillation) ([name])    a. on Eliquis; b. CHADS2VASc = 3 (HTN, age x 37, female).   Paroxysmal atrial fibrillation ([name]) 01/24/2015   Right upper quadrant abdominal tenderness without rebound tenderness 03/02/2018   S/P ascending aortic aneurysm repair 2008    Family History: Family History  Problem Relation Age of Onset   Stroke  Father    Stroke Paternal Grandmother     Social History: Social History   Socioeconomic History   Marital status: Single    Spouse name: Not on file   Number of children: Not on file   Years of education: Not on file   Highest education level: Not on file  Occupational History   Not on file  Tobacco Use   Smoking status: Former    Types: Cigarettes   Smokeless tobacco: Never  Vaping Use   Vaping Use: Never used  Substance and Sexual Activity   Alcohol use: No   Drug use: No   Sexual activity: Never    Birth control/protection: Surgical  Other Topics Concern   Not on file  Social History Narrative   Not on file   Social Determinants of Health   Financial Resource Strain: Low Risk  (08/04/2021)   Overall Financial Resource Strain (CARDIA)    Difficulty of Paying Living Expenses: Not very hard  Food Insecurity: Not on file  Transportation Needs: Not on file  Physical Activity: Not on file  Stress: Not on file  Social Connections: Not on file  Intimate Partner  Violence: Not on file    Vital Signs: Blood pressure 126/61, pulse 72, temperature 97.7 F (36.5 C), resp. rate 16, height 4\' 10"  (1.473 m), weight 189 lb 3.2 oz (85.8 kg), SpO2 93 %.  Examination: General Appearance: The patient is well-developed, well-nourished, and in no distress. Skin: Gross inspection of skin unremarkable. Head: normocephalic, no gross deformities. Eyes: no gross deformities noted. ENT: ears appear grossly normal no exudates. Neck: Supple. No thyromegaly. No LAD. Respiratory: no rhonchi noted. Cardiovascular: Normal S1 and S2 without murmur or rub. Extremities: No cyanosis. pulses are equal. Neurologic: Alert and oriented. No involuntary movements.  LABS: Recent Results (from the past 2160 hour(s))  POCT Urinalysis Dipstick     Status: None   Collection Time: 04/30/22  4:17 PM  Result Value Ref Range   Color, UA     Clarity, UA     Glucose, UA Negative Negative   Bilirubin, UA Negative    Ketones, UA Negative    Spec Grav, UA 1.010 1.010 - 1.025   Blood, UA Negative    pH, UA 5.0 5.0 - 8.0   Protein, UA Negative Negative   Urobilinogen, UA 0.2 0.2 or 1.0 E.U./dL   Nitrite, UA Negative    Leukocytes, UA Negative Negative   Appearance     Odor    Basic metabolic panel     Status: None   Collection Time: 05/05/22  2:47 PM  Result Value Ref Range   Sodium 140 135 - 145 mmol/L   Potassium 3.6 3.5 - 5.1 mmol/L   Chloride 102 98 - 111 mmol/L   CO2 29 22 - 32 mmol/L   Glucose, Bld 83 70 - 99 mg/dL    Comment: Glucose reference range applies only to samples taken after fasting for at least 8 hours.   BUN 22 8 - 23 mg/dL   Creatinine, Ser 0.92 0.44 - 1.00 mg/dL   Calcium 9.2 8.9 - 10.3 mg/dL   GFR, Estimated >60 >60 mL/min    Comment: (NOTE) Calculated using the CKD-EPI Creatinine Equation (2021)    Anion gap 9 5 - 15    Comment: Performed at [facility], [address] [place], [place] [address]  Glucose, capillary     Status: Abnormal    Collection Time: 06/29/22  1:17 PM  Result  Value Ref Range   Glucose-Capillary 102 (H) 70 - 99 mg/dL    Comment: Glucose reference range applies only to samples taken after fasting for at least 8 hours.    Radiology: NM PET Image Initial (PI) Skull Base To Thigh (F-18 FDG)  Result Date: 06/30/2022 CLINICAL DATA:  Initial treatment strategy for lung nodule. EXAM: NUCLEAR MEDICINE PET SKULL BASE TO THIGH TECHNIQUE: 10.9 mCi F-18 FDG was injected intravenously. Full-ring PET imaging was performed from the skull base to thigh after the radiotracer. CT data was obtained and used for attenuation correction and anatomic localization. Fasting blood glucose: 102 mg/dl COMPARISON:  CT chest 06/02/2022, 01/01/2022, 10/17/2021 and 05/11/2020. FINDINGS: Mediastinal blood pool activity: SUV max 2.2 Liver activity: SUV max NA NECK: No abnormal hypermetabolism. Incidental CT findings: None. CHEST: 8 mm apical segment right upper lobe nodule (4 x 12 mm, 2/64), SUV max 1.6. Nodule is minimally increased from 8 mm on 11/14/2021 and is new from 05/11/2020. Subpleural ground-glass and consolidation in the right middle and right lower lobes, slightly increased from 06/02/2022, with expected hypermetabolism. No hypermetabolic lymph nodes. Hypermetabolism associated with surgical clips in the right subpectoral region. No soft tissue CT correlate. Incidental CT findings: Atherosclerotic calcification of the aorta. Aortic valve replacement. Heart is enlarged. No pericardial or pleural effusion. Centrilobular emphysema. Dependent atelectasis in the lingula and left lower lobe. ABDOMEN/PELVIS: No abnormal hypermetabolism in the liver, adrenal glands, spleen or pancreas. No hypermetabolic lymph nodes. Incidental CT findings: Liver margin is irregular. Stones in the gallbladder. Adrenal glands are unremarkable. Probable renal vascular calcifications on the right. Kidneys, spleen, pancreas stomach and bowel are grossly unremarkable.  Atherosclerotic calcification of the aorta. Probable Bartholin cyst on the right. SKELETON: No abnormal hypermetabolism Incidental CT findings: Degenerative changes in the spine. IMPRESSION: 1. Apical segment right upper lobe nodule is borderline in size for PET resolution but is visualized, is minimally larger than on 11/14/2021 and is new from 05/11/2020. 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 2. Right basilar subpleural ground-glass and consolidation, minimally increased and associated hypermetabolism. Findings may be due to atelectasis superimposed on interstitial lung disease, better evaluated on 06/02/2022. 3. Cirrhosis. 4. Cholelithiasis. 5.  Aortic atherosclerosis (ICD10-I70.0). 6.  Emphysema (ICD10-J43.9). Electronically Signed   By: Lorin Picket M.D.   On: 06/30/2022 11:31    No results found.  NM PET Image Initial (PI) Skull Base To Thigh (F-18 FDG)  Result Date: 06/30/2022 CLINICAL DATA:  Initial treatment strategy for lung nodule. EXAM: NUCLEAR MEDICINE PET SKULL BASE TO THIGH TECHNIQUE: 10.9 mCi F-18 FDG was injected intravenously. Full-ring PET imaging was performed from the skull base to thigh after the radiotracer. CT data was obtained and used for attenuation correction and anatomic localization. Fasting blood glucose: 102 mg/dl COMPARISON:  CT chest 06/02/2022, 01/01/2022, 10/17/2021 and 05/11/2020. FINDINGS: Mediastinal blood pool activity: SUV max 2.2 Liver activity: SUV max NA NECK: No abnormal hypermetabolism. Incidental CT findings: None. CHEST: 8 mm apical segment right upper lobe nodule (4 x 12 mm, 2/64), SUV max 1.6. Nodule is minimally increased from 8 mm on 11/14/2021 and is new from 05/11/2020. Subpleural ground-glass and consolidation in the right middle and right lower lobes, slightly increased from 06/02/2022, with expected hypermetabolism. No hypermetabolic lymph nodes. Hypermetabolism associated with surgical clips in the right subpectoral region.  No soft tissue CT correlate. Incidental CT findings: Atherosclerotic calcification of the aorta. Aortic valve replacement. Heart is enlarged. No pericardial or pleural effusion. Centrilobular emphysema. Dependent atelectasis in  the lingula and left lower lobe. ABDOMEN/PELVIS: No abnormal hypermetabolism in the liver, adrenal glands, spleen or pancreas. No hypermetabolic lymph nodes. Incidental CT findings: Liver margin is irregular. Stones in the gallbladder. Adrenal glands are unremarkable. Probable renal vascular calcifications on the right. Kidneys, spleen, pancreas stomach and bowel are grossly unremarkable. Atherosclerotic calcification of the aorta. Probable Bartholin cyst on the right. SKELETON: No abnormal hypermetabolism Incidental CT findings: Degenerative changes in the spine. IMPRESSION: 1. Apical segment right upper lobe nodule is borderline in size for PET resolution but is visualized, is minimally larger than on 11/14/2021 and is new from 05/11/2020. 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 2. Right basilar subpleural ground-glass and consolidation, minimally increased and associated hypermetabolism. Findings may be due to atelectasis superimposed on interstitial lung disease, better evaluated on 06/02/2022. 3. Cirrhosis. 4. Cholelithiasis. 5.  Aortic atherosclerosis (ICD10-I70.0). 6.  Emphysema (ICD10-J43.9). Electronically Signed   By: Lorin Picket M.D.   On: 06/30/2022 11:31      Assessment and Plan: Patient Active Problem List   Diagnosis Date Noted   Chronic upper back pain 04/14/2022   Chronic myofascial pain 04/14/2022   Musculoskeletal disorder involving upper trapezius muscle 04/14/2022   Abnormal CT scan, cervical spine (03/04/2022) 04/01/2022   Trigger point of shoulder region (Bilateral) 04/01/2022   Trigger point with neck pain 04/01/2022   DISH (diffuse idiopathic skeletal hyperostosis) 03/12/2022   Atrial fibrillation, chronic ([place]) 01/26/2022    Morbid obesity ([place]) 01/26/2022   Chronic respiratory failure with hypoxia ([place]) 01/26/2022   Bilateral pneumonia 01/25/2022   Chronic intractable headache 16/09/930   Acute metabolic encephalopathy 35/57/3220   Chronic diastolic CHF (congestive heart failure) ([place]) 12/02/2021   HLD (hyperlipidemia) 12/02/2021   Stroke ([place]) 12/02/2021   Iron deficiency anemia 12/02/2021   Depression 12/02/2021   Acute on chronic respiratory failure with hypoxia ([place]) 11/11/2021   Unable to maintain body in lying position 10/16/2021   Orthopnea 10/16/2021   Class 2 obesity with alveolar hypoventilation, serious comorbidity, and body mass index (BMI) of 39.0 to 39.9 in adult ([place]) 10/16/2021   At high risk for postoperative complications 25/42/7062   Hearing loss 08/12/2021   Long term prescription benzodiazepine use 04/30/2021   Chronic shoulder pain (1ry area of Pain) (Bilateral) (L>R) 04/30/2021   Chronic upper extremity pain (2ry area of Pain) (Bilateral) (L>R) 04/30/2021   Cervicalgia 04/30/2021   Chronic neck pain (3ry area of Pain) (Posterior) (Bilateral) (L>R) 04/30/2021   Shoulder blade pain (4th area of Pain) (Left) 04/30/2021   Osteoarthritis of glenohumeral joint (Left) 04/30/2021   Osteoarthritis of AC (acromioclavicular) joint (Left) 04/30/2021   Osteoarthritis of glenohumeral joints (Bilateral) 04/30/2021   Osteoarthritis of acromioclavicular joints (Bilateral) 04/30/2021   Primary osteoarthritis of shoulders (Bilateral) 04/30/2021   DDD (degenerative disc disease), cervical 04/30/2021   Cervical radiculitis (Left) 04/30/2021   Cervical radiculopathy (Left) 04/30/2021   C6 radiculopathy (Left) 04/30/2021   C7 radiculopathy (Left) 04/30/2021   Wheelchair dependence 04/30/2021   Chronic pain syndrome 04/29/2021   Pharmacologic therapy 04/29/2021   Disorder of skeletal system 04/29/2021   Problems influencing health status 04/29/2021   Chronic anticoagulation (Eliquis) 03/14/2021    Hx of atrioventricular node ablation 03/14/2021   Symptomatic anemia 01/29/2021   Acute on chronic diastolic CHF (congestive heart failure) ([place]) 05/11/2020   Chronic obstructive pulmonary disease (COPD) ([place])    Hypothyroidism    Cardiac pacemaker in situ 05/10/2020   Acute non-recurrent frontal sinusitis 04/22/2020   Vertigo 04/22/2020  S/P placement of cardiac pacemaker 02/21/2020   Atrial fibrillation status post cardioversion [facility]) 11/16/2019   Episode of moderate major depression ([place]) 10/10/2019   Persistent atrial fibrillation ([place])    Encounter for general adult medical examination with abnormal findings 07/10/2019   Encounter for screening mammogram for malignant neoplasm of breast 07/10/2019   Atopic dermatitis 05/02/2019   Paroxysmal atrial flutter ([place]) 08/11/2018   Encounter for long-term (current) use of medications 07/09/2018   Chronic left shoulder pain 07/09/2018   Conjunctivitis 07/09/2018   Oxygen dependent 07/09/2018   GAD (generalized anxiety disorder) 07/09/2018   Ovarian failure 07/09/2018   Positive colorectal cancer screening using Cologuard test 04/24/2018   Calculus of gallbladder with acute on chronic cholecystitis 04/08/2018   H/O: CVA (cerebrovascular accident) 04/08/2018   Dysuria 03/02/2018   Right upper quadrant abdominal tenderness without rebound tenderness 03/02/2018   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 exacerbation 03/02/2018   Chronic obstructive pulmonary disease ([place]) 09/19/2017   Typical atrial flutter ([name])    Irritable bowel syndrome without diarrhea 01/29/2015   Essential hypertension 01/24/2015   Paroxysmal atrial fibrillation ([place]) 01/24/2015   History of aortic valve replacement with bioprosthetic valve 01/24/2015   Atrial fibrillation with RVR ([place]) 01/11/2015   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 05/18/2014   Shoulder impingement syndrome (Left) 05/18/2014   Cellulitis and abscess 02/13/2014   MRSA  (methicillin resistant Staphylococcus aureus) 02/13/2014   Aneurysm, ascending aorta ([place]) 06/23/2013   Bicuspid aortic valve 06/23/2013    1. Pulmonary nodule 1 cm or greater in diameter PET scan is not clear-cut conclusive but is suggestive of stage I bronchogenic carcinoma.  Spoke with the patient at length as discussed above we are going to get a CT surgery evaluation and opinion.  I will make the referral  2. ILD (interstitial lung disease) ([place]) She does have some changes of interstitial lung disease noted on the current scan which are known  3. COPD mixed type [facility]) Medical management inhalers nebulizers as needed  4. Chronic diastolic heart failure ([name]) Appears to be compensated at this time we will continue to monitor fluid status closely.  5. Chronic respiratory failure with hypoxia ([name]) Oxygen therapy will be continued she needs to be compliant with recommended therapy.  General Counseling: I have discussed the findings of the evaluation and examination with Xandrea.  I have also discussed any further diagnostic evaluation thatmay be needed or ordered today. Elizbeth verbalizes understanding of the findings of todays visit. We also reviewed her medications today and discussed drug interactions and side effects including but not limited excessive drowsiness and altered mental states. We also discussed that there is always a risk not just to her but also people around her. she has been encouraged to call the office with any questions or concerns that should arise related to todays visit.  No orders of the defined types were placed in this encounter.    Time spent: 75  I have personally obtained a history, examined the patient, evaluated laboratory and imaging results, formulated the assessment and plan and placed orders.    Allyne Gee, MD [facility] Pulmonary and Critical Care Sleep medicine

## 2022-07-09 NOTE — Telephone Encounter (Signed)
done

## 2022-07-10 ENCOUNTER — Telehealth: Payer: Self-pay | Admitting: Internal Medicine

## 2022-07-10 ENCOUNTER — Emergency Department: Payer: Medicare Other

## 2022-07-10 ENCOUNTER — Other Ambulatory Visit: Payer: Self-pay

## 2022-07-10 ENCOUNTER — Emergency Department
Admission: EM | Admit: 2022-07-10 | Discharge: 2022-07-10 | Disposition: A | Discharge status: Home / Self Care | Payer: Medicare Other | Attending: Emergency Medicine | Admitting: Emergency Medicine

## 2022-07-10 DIAGNOSIS — I509 Heart failure, unspecified: Secondary | ICD-10-CM | POA: Diagnosis not present

## 2022-07-10 DIAGNOSIS — K8050 Calculus of bile duct without cholangitis or cholecystitis without obstruction: Secondary | ICD-10-CM | POA: Diagnosis not present

## 2022-07-10 DIAGNOSIS — R1011 Right upper quadrant pain: Secondary | ICD-10-CM | POA: Diagnosis not present

## 2022-07-10 DIAGNOSIS — K8020 Calculus of gallbladder without cholecystitis without obstruction: Secondary | ICD-10-CM | POA: Diagnosis not present

## 2022-07-10 DIAGNOSIS — Z20822 Contact with and (suspected) exposure to covid-19: Secondary | ICD-10-CM | POA: Diagnosis not present

## 2022-07-10 DIAGNOSIS — J449 Chronic obstructive pulmonary disease, unspecified: Secondary | ICD-10-CM | POA: Insufficient documentation

## 2022-07-10 DIAGNOSIS — R1013 Epigastric pain: Secondary | ICD-10-CM | POA: Diagnosis not present

## 2022-07-10 DIAGNOSIS — R0602 Shortness of breath: Secondary | ICD-10-CM | POA: Diagnosis not present

## 2022-07-10 DIAGNOSIS — R1010 Upper abdominal pain, unspecified: Secondary | ICD-10-CM

## 2022-07-10 DIAGNOSIS — I110 Hypertensive heart disease with heart failure: Secondary | ICD-10-CM | POA: Diagnosis not present

## 2022-07-10 LAB — CBC WITH DIFFERENTIAL/PLATELET
Abs Immature Granulocytes: 0.02 10*3/uL (ref 0.00–0.07)
Basophils Absolute: 0 10*3/uL (ref 0.0–0.1)
Basophils Relative: 0 %
Eosinophils Absolute: 0.2 10*3/uL (ref 0.0–0.5)
Eosinophils Relative: 3 %
HCT: 36.4 % (ref 36.0–46.0)
Hemoglobin: 11.5 g/dL — ABNORMAL LOW (ref 12.0–15.0)
Immature Granulocytes: 0 %
Lymphocytes Relative: 17 %
Lymphs Abs: 1.1 10*3/uL (ref 0.7–4.0)
MCH: 28.3 pg (ref 26.0–34.0)
MCHC: 31.6 g/dL (ref 30.0–36.0)
MCV: 89.4 fL (ref 80.0–100.0)
Monocytes Absolute: 0.8 10*3/uL (ref 0.1–1.0)
Monocytes Relative: 12 %
Neutro Abs: 4.3 10*3/uL (ref 1.7–7.7)
Neutrophils Relative %: 68 %
Platelets: 230 10*3/uL (ref 150–400)
RBC: 4.07 MIL/uL (ref 3.87–5.11)
RDW: 16 % — ABNORMAL HIGH (ref 11.5–15.5)
WBC: 6.3 10*3/uL (ref 4.0–10.5)
nRBC: 0 % (ref 0.0–0.2)

## 2022-07-10 LAB — LIPASE, BLOOD: Lipase: 30 U/L (ref 11–51)

## 2022-07-10 LAB — COMPREHENSIVE METABOLIC PANEL
ALT: 15 U/L (ref 0–44)
AST: 19 U/L (ref 15–41)
Albumin: 3.8 g/dL (ref 3.5–5.0)
Alkaline Phosphatase: 78 U/L (ref 38–126)
Anion gap: 7 (ref 5–15)
BUN: 24 mg/dL — ABNORMAL HIGH (ref 8–23)
CO2: 26 mmol/L (ref 22–32)
Calcium: 9.2 mg/dL (ref 8.9–10.3)
Chloride: 103 mmol/L (ref 98–111)
Creatinine, Ser: 0.97 mg/dL (ref 0.44–1.00)
GFR, Estimated: >60 mL/min (ref >60)
Glucose, Bld: 109 mg/dL — ABNORMAL HIGH (ref 70–99)
Potassium: 3.9 mmol/L (ref 3.5–5.1)
Sodium: 136 mmol/L (ref 135–145)
Total Bilirubin: 0.6 mg/dL (ref 0.3–1.2)
Total Protein: 7 g/dL (ref 6.5–8.1)

## 2022-07-10 LAB — TROPONIN I (HIGH SENSITIVITY)
Troponin I (High Sensitivity): 16 ng/L (ref <18)
Troponin I (High Sensitivity): 17 ng/L (ref <18)

## 2022-07-10 LAB — BRAIN NATRIURETIC PEPTIDE: B Natriuretic Peptide: 120.8 pg/mL — ABNORMAL HIGH (ref 0.0–100.0)

## 2022-07-10 LAB — RESP PANEL BY RT-PCR (FLU A&B, COVID) ARPGX2
Influenza A by PCR: NEGATIVE
Influenza B by PCR: NEGATIVE
SARS Coronavirus 2 by RT PCR: NEGATIVE

## 2022-07-10 MED ORDER — ONDANSETRON 4 MG PO TBDP: 4.0000 mg | ORAL_TABLET | Freq: Three times a day (TID) | ORAL | 0 refills | Status: DC | PRN

## 2022-07-10 NOTE — ED Notes (Signed)
Pt yelling out asking why she is still waiting to get checked in. Pt informed that we are working hard to get triaged but we are busy, pt stated "Well it don't look too busy". Pt is next to be triaged.

## 2022-07-10 NOTE — ED Notes (Signed)
Pt arrived to ED c/o shortness of breath. Pt states that she has a history of CHF. Pt wear oxygen at base line.

## 2022-07-10 NOTE — ED Provider Notes (Signed)
[facility] Provider Note    Event Date/Time   First MD Initiated Contact with Patient 07/10/22 2044     (approximate)   History   Chief Complaint: Shortness of Breath   HPI  [name] is a 76 y.o. female with a past history of COPD on 4 L nasal cannula, CHF, morbid obesity, cholelithiasis who comes the ED complaining of upper abdominal pain for the past week, off-and-on, worse with eating.  Nonradiating.  No vomiting but she does feel like she has had decreased oral intake.  No significant chest pain or increased shortness of breath above her chronic cardiopulmonary disease.  No fever.     Physical Exam   Triage Vital Signs: ED Triage Vitals  Enc Vitals Group     BP 07/10/22 1640 109/63     Pulse Rate 07/10/22 1640 80     Resp 07/10/22 1640 (!) 22     Temp 07/10/22 1640 98.5 F (36.9 C)     Temp Source 07/10/22 1640 Oral     SpO2 07/10/22 1640 96 %     Weight 07/10/22 1639 189 lb (85.7 kg)     Height 07/10/22 1639 4\' 10"  (1.473 m)     Head Circumference --      Peak Flow --      Pain Score 07/10/22 1639 7     Pain Loc --      Pain Edu? --      Excl. in [place]? --     Most recent vital signs: Vitals:   07/10/22 1640 07/10/22 2043  BP: 109/63 116/63  Pulse: 80 66  Resp: (!) 22 20  Temp: 98.5 F (36.9 C) 98.1 F (36.7 C)  SpO2: 96% 98%    General: Awake, no distress.  CV:  Good peripheral perfusion.  Regular rate and rhythm.  Symmetric distal pulses. Resp:  Normal effort.  Clear to auscultation bilaterally.  No wheezing or crackles. Abd:  No distention.  Soft with mild generalized tenderness.  No abdominal wall edema. Other:  No lower extremity edema.  No calf tenderness or swelling.   ED Results / Procedures / Treatments   Labs (all labs ordered are listed, but only abnormal results are displayed) Labs Reviewed  CBC WITH DIFFERENTIAL/PLATELET - Abnormal; Notable for the following components:      Result Value   Hemoglobin  11.5 (*)    RDW 16.0 (*)    All other components within normal limits  BRAIN NATRIURETIC PEPTIDE - Abnormal; Notable for the following components:   B Natriuretic Peptide 120.8 (*)    All other components within normal limits  COMPREHENSIVE METABOLIC PANEL - Abnormal; Notable for the following components:   Glucose, Bld 109 (*)    BUN 24 (*)    All other components within normal limits  RESP PANEL BY RT-PCR (FLU A&B, COVID) ARPGX2  LIPASE, BLOOD  TROPONIN I (HIGH SENSITIVITY)  TROPONIN I (HIGH SENSITIVITY)     EKG Interpreted by me Paced rhythm, rate of 73.  Left axis, LVH, left bundle branch block.  No acute ischemic changes.   RADIOLOGY Chest x-ray interpreted by me, negative for edema effusion pneumothorax or consolidation.  Radiology report reviewed.   PROCEDURES:  Procedures   MEDICATIONS ORDERED IN ED: Medications - No data to display   IMPRESSION / MDM / [place] / ED COURSE  I reviewed the triage vital signs and the nursing notes.  Differential diagnosis includes, but is not limited to, cholecystitis, pancreatitis, gastritis, non-STEMI, pleural effusion, pulmonary edema  Patient's presentation is most consistent with acute presentation with potential threat to life or bodily function.  Patient presents with upper abdominal pain intermittently for the past week.  Serum labs are all normal.  Chest x-ray and right upper quadrant ultrasound also unremarkable.  She has known cholelithiasis which is again demonstrated today.  No fever or leukocytosis, negative Murphy sign on exam and no focal right upper quadrant tenderness.  I think outpatient follow-up with cardiology and general surgery would be appropriate for her.  She will continue her home medications including twice daily Bumex.       FINAL CLINICAL IMPRESSION(S) / ED DIAGNOSES   Final diagnoses:  Upper abdominal pain  Biliary colic  Chronic congestive heart  failure, unspecified heart failure type ([place])     Rx / DC Orders   ED Discharge Orders          Ordered    ondansetron (ZOFRAN-ODT) 4 MG disintegrating tablet  Every 8 hours PRN        07/10/22 2112             Note:  This document was prepared using Dragon voice recognition software and may include unintentional dictation errors.   Carrie Mew, MD 07/10/22 2126

## 2022-07-10 NOTE — ED Notes (Signed)
Pts oxygen switched out for hospital oxygen tank.

## 2022-07-10 NOTE — Telephone Encounter (Signed)
Received message back from cardiologists nurse via epic stating patient needs to have had pft 3 mos prior to being scheduled. I sent message back to her explaining patient is unable to complete pft due to her breathing issues. I s/w patient, she stated nurse I need to s/w is out of office until Monday. I called office. Lvm for nurse, [name] to return my call-Toni

## 2022-07-10 NOTE — ED Provider Triage Note (Signed)
Emergency Medicine Provider Triage Evaluation Note  [name] , a 76 y.o. female  was evaluated in triage.  Pt complains of "I think I have CHF." Reports legs swollen up to abdomen.  Reports she ahs gallstones, and now she has pain with eating. Reports SOB with walking to bathroom her O2 goes down to 80. Has been using 4L [place].  Review of Systems  Positive: Leg swelling, abd pain, SOB Negative: Fever, cough  Physical Exam  There were no vitals taken for this visit. Gen:   Awake, no distress   Resp:  Normal effort  MSK:   Moves extremities without difficulty  Other:    Medical Decision Making  Medically screening exam initiated at 4:35 PM.  Appropriate orders placed.  [name] was informed that the remainder of the evaluation will be completed by another provider, this initial triage assessment does not replace that evaluation, and the importance of remaining in the ED until their evaluation is complete.     Marquette Old, PA-C 07/10/22 1637

## 2022-07-10 NOTE — ED Triage Notes (Signed)
Pt arrives with c/o SOB and CP. Pt has hx of CHF. Pt also c/o RUQ ABD pain. Pt wear 4L of O2 at home. Per pt, she has had some swelling in her ABD area.

## 2022-07-13 ENCOUNTER — Ambulatory Visit: Payer: Medicare Other | Admitting: Nurse Practitioner

## 2022-07-13 ENCOUNTER — Encounter: Payer: Self-pay | Admitting: Nurse Practitioner

## 2022-07-13 VITALS — BP 114/59 | HR 77 | Temp 97.5°F | Resp 16 | Ht <= 58 in | Wt 188.6 lb

## 2022-07-13 DIAGNOSIS — R0602 Shortness of breath: Secondary | ICD-10-CM | POA: Diagnosis not present

## 2022-07-13 DIAGNOSIS — I5032 Chronic diastolic (congestive) heart failure: Secondary | ICD-10-CM | POA: Diagnosis not present

## 2022-07-13 DIAGNOSIS — J9611 Chronic respiratory failure with hypoxia: Secondary | ICD-10-CM | POA: Diagnosis not present

## 2022-07-13 DIAGNOSIS — I501 Left ventricular failure: Secondary | ICD-10-CM | POA: Diagnosis not present

## 2022-07-13 MED ORDER — PREDNISONE 10 MG PO TABS: ORAL_TABLET | ORAL | 0 refills | Status: DC

## 2022-07-13 NOTE — Progress Notes (Unsigned)
[facility] [place], [place] [address]  Internal MEDICINE  Office Visit Note  Patient Name: [name]  [id number]  [id number]  Date of Service: 07/13/2022  Chief Complaint  Patient presents with   Follow-up    Follow up ED fluid on lungs and abdominal pain     HPI [name] presents for a follow-up visit for  Fluid buildup     Current Medication: Outpatient Encounter Medications as of 07/13/2022  Medication Sig   ALPRAZolam (XANAX) 0.25 MG tablet TAKE 1 TABLET(0.25 MG) BY MOUTH TWICE DAILY AS NEEDED FOR ANXIETY   apixaban (ELIQUIS) 5 MG TABS tablet Take 1 tablet (5 mg total) by mouth 2 (two) times daily.   atorvastatin (LIPITOR) 40 MG tablet TAKE 1 TABLET BY MOUTH EVERY NIGHT AT BEDTIME   Budeson-Glycopyrrol-Formoterol (BREZTRI AEROSPHERE) 160-9-4.8 MCG/ACT AERO Inhale 2 puffs into the lungs 2 (two) times daily.   bumetanide (BUMEX) 1 MG tablet Take 2 tablets (2 mg total) by mouth daily. And additional 2 tablets PM PRN   Calcium Carbonate-Vitamin D (CALCIUM 600+D PO) Take 1 tablet by mouth daily.   carvedilol (COREG) 6.25 MG tablet Take 1 tablet (6.25 mg total) by mouth 2 (two) times daily with a meal.   Coenzyme Q10 (COQ10) 200 MG CAPS Take 200 mg by mouth daily.   diclofenac Sodium (VOLTAREN) 1 % GEL SMARTSIG:Gram(s) Topical Twice Daily   empagliflozin (JARDIANCE) 10 MG TABS tablet Take 1 tablet (10 mg total) by mouth daily before breakfast.   ferrous sulfate 325 (65 FE) MG tablet Take 1 tablet (325 mg total) by mouth daily with breakfast.   flecainide (TAMBOCOR) 50 MG tablet Take 50 mg by mouth daily.   ipratropium-albuterol (DUONEB) 0.5-2.5 (3) MG/3ML SOLN Inhale 3 mLs into the lungs every 4 (four) hours as needed. Inhale 3 mls into the lungs every 4 to 6 hours and as needed   levothyroxine (SYNTHROID) 25 MCG tablet TAKE 1 TABLET BY MOUTH DAILY BEFORE BREAKFAST   lisinopril (ZESTRIL) 40 MG tablet TAKE 1 TABLET(40 MG) BY MOUTH DAILY(DOSE INCREASE)    loratadine (CLARITIN) 10 MG tablet Take 10 mg by mouth daily.   ondansetron (ZOFRAN-ODT) 4 MG disintegrating tablet Take 1 tablet (4 mg total) by mouth every 8 (eight) hours as needed for nausea or vomiting.   oxyCODONE-acetaminophen (PERCOCET) 7.5-325 MG tablet Take one tab po bid for pain severe   pantoprazole (PROTONIX) 40 MG tablet TAKE 1 TABLET BY MOUTH EVERY DAY   potassium chloride (KLOR-CON) 10 MEQ tablet Take 4 tablets (40 mEq total) by mouth 2 (two) times daily.   sertraline (ZOLOFT) 100 MG tablet Take one tab a day for Anxiety   sodium chloride (OCEAN) 0.65 % SOLN nasal spray Place 1 spray into both nostrils as needed for congestion.   traZODone (DESYREL) 50 MG tablet Take 1 tablet (50 mg total) by mouth at bedtime.   VENTOLIN HFA 108 (90 Base) MCG/ACT inhaler INHALE 2 PUFFS INTO THE LUNGS EVERY 6 HOURS AS NEEDED FOR WHEEZING OR SHORTNESS OF BREATH   No facility-administered encounter medications on file as of 07/13/2022.    Surgical History: Past Surgical History:  Procedure Laterality Date   ABDOMINAL AORTIC ANEURYSM REPAIR  2008   ABDOMINAL HYSTERECTOMY     AORTIC VALVE REPLACEMENT  2008   CARDIAC CATHETERIZATION     [place]   CARDIOVERSION N/A 08/15/2018   Procedure: CARDIOVERSION;  Surgeon: [name], MD;  Location: [facility] ORS;  Service: Cardiovascular;  Laterality:  N/A;   CARDIOVERSION N/A 11/14/2018   Procedure: CARDIOVERSION (CATH LAB);  Surgeon: [name], MD;  Location: [facility];  Service: Cardiovascular;  Laterality: N/A;   CARDIOVERSION N/A 06/05/2019   Procedure: CARDIOVERSION;  Surgeon: [name], MD;  Location: [facility];  Service: Cardiovascular;  Laterality: N/A;   CARDIOVERSION N/A 08/14/2019   Procedure: CARDIOVERSION;  Surgeon: [name], MD;  Location: [facility];  Service: Cardiovascular;  Laterality: N/A;   CARDIOVERSION N/A 11/09/2019   Procedure: CARDIOVERSION;  Surgeon: Isaias Cowman, MD;  Location: [facility];  Service:  Cardiovascular;  Laterality: N/A;   CARDIOVERSION N/A 01/17/2020   Procedure: CARDIOVERSION;  Surgeon: Isaias Cowman, MD;  Location: [facility];  Service: Cardiovascular;  Laterality: N/A;   CARPAL TUNNEL RELEASE     ELECTROPHYSIOLOGIC STUDY N/A 08/17/2016   Procedure: Cardioversion;  Surgeon: [name], MD;  Location: [facility];  Service: Cardiovascular;  Laterality: N/A;   TUMOR EXCISION Left    x3 (arm)    Medical History: Past Medical History:  Diagnosis Date   Aortic 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 s/p bioprosthetic valve replacement 2008 at [facility];  b. 01/2015 Echo: EF 60-65%, no rwma, Gr1 DD, mildly dil LA, nl RV fxn.   Aspiration pneumonia ([name])    Atrial fibrillation with RVR ([name]) 01/11/2015   Atrial flutter ([name])    a. 08/2016 s/p DCCV.  Remains on flecainide 50 mg bid.   Basal skull fracture ([name]) 20 yrs ago   CHF (congestive heart failure) ([name])    Chronic respiratory failure ([name])    COPD (chronic obstructive pulmonary disease) ([place])    a. 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 2008 prior to Aortic aneurysm repair-->nl cors.   History of stress test    a. 10/2015 MV: no ischemia/infarct.   HLD (hyperlipidemia)    HTN (hypertension)    Hypothyroidism    Obesity    PAF (paroxysmal atrial fibrillation) ([name])    a. on Eliquis; b. CHADS2VASc = 3 (HTN, age x 78, female).   Paroxysmal atrial fibrillation ([name]) 01/24/2015   Right upper quadrant abdominal tenderness without rebound tenderness 03/02/2018   S/P ascending aortic aneurysm repair 2008    Family History: Family History  Problem Relation Age of Onset   Stroke Father    Stroke Paternal Grandmother     Social History   Socioeconomic History   Marital status: Single    Spouse name: Not on file   Number of children: Not on file   Years of education: Not on file    Highest education level: Not on file  Occupational History   Not on file  Tobacco Use   Smoking status: Former    Types: Cigarettes   Smokeless tobacco: Never  Vaping Use   Vaping Use: Never used  Substance and Sexual Activity   Alcohol use: No   Drug use: No   Sexual activity: Never    Birth control/protection: Surgical  Other Topics Concern   Not on file  Social History Narrative   Not on file   Social Determinants of Health   Financial Resource Strain: Low Risk  (08/04/2021)   Overall Financial Resource Strain (CARDIA)    Difficulty of Paying Living Expenses: Not very hard  Food Insecurity: Not on file  Transportation Needs: Not on file  Physical Activity: Not on file  Stress: Not on file  Social Connections: Not on file  Intimate Partner Violence: Not on file      Review of Systems  Vital Signs: BP (!) 114/59   Pulse 77   Temp (!) 97.5 F (36.4 C)   Resp 16   Ht 4\' 10"  (1.473 m)   Wt 188 lb 9.6 oz (85.5 kg)   SpO2 93% Comment: 3L  BMI 39.42 kg/m    Physical Exam     Assessment/Plan:   General Counseling: Nastasia verbalizes understanding of the findings of todays visit and agrees with plan of treatment. I have discussed any further diagnostic evaluation that may be needed or ordered today. We also reviewed her medications today. she has been encouraged to call the office with any questions or concerns that should arise related to todays visit.    No orders of the defined types were placed in this encounter.   No orders of the defined types were placed in this encounter.   No follow-ups on file.   Total time spent:*** Minutes Time spent includes review of chart, medications, test results, and follow up plan with the patient.   [place] Controlled Substance Database was reviewed by me.  This patient was seen by Jonetta Osgood, FNP-C in collaboration with Dr. Clayborn Bigness as a part of collaborative care agreement.   Perl Folmar R. Valetta Fuller, MSN,  FNP-C Internal medicine

## 2022-07-14 ENCOUNTER — Encounter: Payer: Self-pay | Admitting: Family

## 2022-07-14 ENCOUNTER — Telehealth: Payer: Self-pay | Admitting: Family

## 2022-07-14 ENCOUNTER — Encounter: Payer: Self-pay | Admitting: Nurse Practitioner

## 2022-07-14 ENCOUNTER — Telehealth: Payer: Self-pay | Admitting: Internal Medicine

## 2022-07-14 ENCOUNTER — Ambulatory Visit: Payer: Medicare Other | Attending: Family | Admitting: Family

## 2022-07-14 VITALS — BP 121/86 | HR 74 | Resp 16 | Ht 60.0 in | Wt 189.0 lb

## 2022-07-14 DIAGNOSIS — I10 Essential (primary) hypertension: Secondary | ICD-10-CM

## 2022-07-14 DIAGNOSIS — M25511 Pain in right shoulder: Secondary | ICD-10-CM | POA: Diagnosis not present

## 2022-07-14 DIAGNOSIS — R140 Abdominal distension (gaseous): Secondary | ICD-10-CM | POA: Insufficient documentation

## 2022-07-14 DIAGNOSIS — M25512 Pain in left shoulder: Secondary | ICD-10-CM | POA: Insufficient documentation

## 2022-07-14 DIAGNOSIS — I110 Hypertensive heart disease with heart failure: Secondary | ICD-10-CM | POA: Insufficient documentation

## 2022-07-14 DIAGNOSIS — Z7984 Long term (current) use of oral hypoglycemic drugs: Secondary | ICD-10-CM | POA: Insufficient documentation

## 2022-07-14 DIAGNOSIS — Z950 Presence of cardiac pacemaker: Secondary | ICD-10-CM | POA: Insufficient documentation

## 2022-07-14 DIAGNOSIS — Z7952 Long term (current) use of systemic steroids: Secondary | ICD-10-CM | POA: Insufficient documentation

## 2022-07-14 DIAGNOSIS — I480 Paroxysmal atrial fibrillation: Secondary | ICD-10-CM | POA: Insufficient documentation

## 2022-07-14 DIAGNOSIS — E785 Hyperlipidemia, unspecified: Secondary | ICD-10-CM | POA: Diagnosis not present

## 2022-07-14 DIAGNOSIS — J449 Chronic obstructive pulmonary disease, unspecified: Secondary | ICD-10-CM | POA: Insufficient documentation

## 2022-07-14 DIAGNOSIS — I5032 Chronic diastolic (congestive) heart failure: Secondary | ICD-10-CM | POA: Insufficient documentation

## 2022-07-14 DIAGNOSIS — Z79899 Other long term (current) drug therapy: Secondary | ICD-10-CM | POA: Insufficient documentation

## 2022-07-14 DIAGNOSIS — H9192 Unspecified hearing loss, left ear: Secondary | ICD-10-CM | POA: Diagnosis not present

## 2022-07-14 DIAGNOSIS — Z87891 Personal history of nicotine dependence: Secondary | ICD-10-CM | POA: Insufficient documentation

## 2022-07-14 DIAGNOSIS — E039 Hypothyroidism, unspecified: Secondary | ICD-10-CM | POA: Diagnosis not present

## 2022-07-14 DIAGNOSIS — M545 Low back pain, unspecified: Secondary | ICD-10-CM | POA: Diagnosis not present

## 2022-07-14 NOTE — Patient Instructions (Signed)
Continue weighing daily and call for an overnight weight gain of 3 pounds or more or a weekly weight gain of more than 5 pounds.   If you have voicemail, please make sure your mailbox is cleaned out so that we may leave a message and please make sure to listen to any voicemails.     

## 2022-07-14 NOTE — Progress Notes (Signed)
Patient ID: QUANTINA DERSHEM, female    DOB: 11-14-1945, 76 y.o.   MRN: [id number]  HPI  Ms Klunk is a 76 y/o female with a history of hyperlipidemia, HTN, thyroid disease, COPD, left ear deafness, atrial fibrillation, previous tobacco use and chronic heart failure.   Echo report from 11/11/21 reviewed and showed an EF of 60-65%. Echo report from 05/16/21 reviewed and showed an EF of 45% along with mild LVH. Echo report from 02/07/20 reviewed and showed an EF of >55% along with severe LAE. Catheterization done 02/07/20 but unable to view the results.   Was in the ED 07/10/22 due to upper abdominal pain. Serum labs are all normal. Chest x-ray and right upper quadrant ultrasound also unremarkable.  She has known cholelithiasis which is again demonstrated today.    She presents today for a follow-up visit with a chief complaint of moderate shortness of breath with minimal exertion. Describes this as chronic in nature. She has associated fatigue, decreased appetite, cough, intermittent chest pain, abdominal distention, light-headedness, difficulty sleeping, hoarseness and chronic pain along with this. She denies any abdominal pain, palpitations, weight gain or pedal edema.   Is seeing her cardiothoracic surgeon on 07/24/22. She last saw him ~ 2 years ago and had her valve replaced 15 years ago.   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 s/p bioprosthetic valve replacement 2008 at [facility];  b. 01/2015 Echo: EF 60-65%, no rwma, Gr1 DD, mildly dil LA, nl RV fxn.   Aspiration pneumonia ([place])    Atrial fibrillation with RVR ([place]) 01/11/2015   Atrial flutter ([place])    a. 08/2016 s/p DCCV.  Remains on flecainide 50 mg bid.   Basal skull fracture ([name]) 20 yrs ago   CHF (congestive heart failure) ([name])    Chronic respiratory failure ([name])    COPD (chronic obstructive pulmonary disease) ([place])    a. 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 2008 prior to Aortic aneurysm repair-->nl cors.   History of stress test    a. 10/2015 MV: no ischemia/infarct.   HLD (hyperlipidemia)    HTN (hypertension)    Hypothyroidism    Obesity    PAF (paroxysmal atrial fibrillation) ([name])    a. on Eliquis; b. CHADS2VASc = 3 (HTN, age x 46, female).   Paroxysmal atrial fibrillation ([name]) 01/24/2015   Right upper quadrant abdominal tenderness without rebound tenderness 03/02/2018   S/P ascending aortic aneurysm repair 2008   Past Surgical History:  Procedure Laterality Date   ABDOMINAL AORTIC ANEURYSM REPAIR  2008   ABDOMINAL HYSTERECTOMY     AORTIC VALVE REPLACEMENT  2008   CARDIAC CATHETERIZATION     [facility]   CARDIOVERSION N/A 08/15/2018   Procedure: CARDIOVERSION;  Surgeon: [name], MD;  Location: [facility] ORS;  Service: Cardiovascular;  Laterality: N/A;   CARDIOVERSION N/A 11/14/2018   Procedure: CARDIOVERSION (CATH LAB);  Surgeon: [name], MD;  Location: [place] [facility];  Service: Cardiovascular;  Laterality: N/A;   CARDIOVERSION N/A 06/05/2019   Procedure: CARDIOVERSION;  Surgeon: [name], MD;  Location: [place] [facility];  Service: Cardiovascular;  Laterality: N/A;   CARDIOVERSION N/A 08/14/2019   Procedure: CARDIOVERSION;  Surgeon: [name], MD;  Location: [facility];  Service: Cardiovascular;  Laterality: N/A;   CARDIOVERSION N/A 11/09/2019   Procedure: CARDIOVERSION;  Surgeon: Isaias Cowman, MD;  Location: [facility];  Service: Cardiovascular;  Laterality: N/A;   CARDIOVERSION N/A 01/17/2020   Procedure: CARDIOVERSION;  Surgeon: Isaias Cowman, MD;  Location: [facility];  Service: Cardiovascular;  Laterality: N/A;   CARPAL TUNNEL RELEASE     ELECTROPHYSIOLOGIC STUDY N/A 08/17/2016   Procedure: Cardioversion;  Surgeon: [name], MD;  Location: [facility];  Service: Cardiovascular;  Laterality: N/A;   TUMOR EXCISION Left     x3 (arm)   Family History  Problem Relation Age of Onset   Stroke Father    Stroke Paternal Grandmother    Social History   Tobacco Use   Smoking status: Former    Types: Cigarettes   Smokeless tobacco: Never  Substance Use Topics   Alcohol use: No   Allergies  Allergen Reactions   Benadryl [Diphenhydramine Hcl (Sleep)] Palpitations   Cetirizine Palpitations   Lasix [Furosemide] Rash   Levaquin [Levofloxacin In D5w] Other (See Comments)    Reaction:  Fatigue and muscle soreness   Meloxicam Rash   Soy Allergy Hives and Nausea And Vomiting   Sulfa Antibiotics Rash   Prior to Admission medications   Medication Sig Start Date End Date Taking? Authorizing Provider  ALPRAZolam Duanne Moron) 0.25 MG tablet TAKE 1 TABLET(0.25 MG) BY MOUTH TWICE DAILY AS NEEDED FOR ANXIETY 06/08/22  Yes [name], MD  amLODipine (NORVASC) 10 MG tablet Take 10 mg by mouth daily.   Yes [provider]  apixaban (ELIQUIS) 5 MG TABS tablet Take 1 tablet (5 mg total) by mouth 2 (two) times daily. 11/04/21  Yes [name], MD  atorvastatin (LIPITOR) 40 MG tablet TAKE 1 TABLET BY MOUTH EVERY NIGHT AT BEDTIME 03/26/22  Yes [name], MD  Budeson-Glycopyrrol-Formoterol (BREZTRI AEROSPHERE) 160-9-4.8 MCG/ACT AERO Inhale 2 puffs into the lungs 2 (two) times daily. 01/22/22  Yes Abernathy, [name], NP  bumetanide (BUMEX) 1 MG tablet Take 2 tablets (2 mg total) by mouth daily. And additional 2 tablets PM PRN 04/27/22  Yes Darylene Price A, FNP  Calcium Carbonate-Vitamin D (CALCIUM 600+D PO) Take 1 tablet by mouth daily.   Yes [provider]  carvedilol (COREG) 6.25 MG tablet Take 1 tablet (6.25 mg total) by mouth 2 (two) times daily with a meal. 12/03/21  Yes Dwyane Dee, MD  Coenzyme Q10 (COQ10) 200 MG CAPS Take 200 mg by mouth daily.   Yes [provider]  diclofenac Sodium (VOLTAREN) 1 % GEL SMARTSIG:Gram(s) Topical Twice Daily 09/09/20  Yes [provider]  empagliflozin  (JARDIANCE) 10 MG TABS tablet Take 1 tablet (10 mg total) by mouth daily before breakfast. 04/27/22  Yes Darylene Price A, FNP  ferrous sulfate 325 (65 FE) MG tablet Take 1 tablet (325 mg total) by mouth daily with breakfast. 12/03/21  Yes Dwyane Dee, MD  ipratropium-albuterol (DUONEB) 0.5-2.5 (3) MG/3ML SOLN Inhale 3 mLs into the lungs every 4 (four) hours as needed. Inhale 3 mls into the lungs every 4 to 6 hours and as needed 01/29/22  Yes Allyne Gee, MD  levothyroxine (SYNTHROID) 25 MCG tablet TAKE 1 TABLET BY MOUTH DAILY BEFORE BREAKFAST 03/22/22  Yes [name], MD  lisinopril (ZESTRIL) 40 MG tablet TAKE 1 TABLET(40 MG) BY MOUTH DAILY(DOSE INCREASE) 09/29/21  Yes Darylene Price A, FNP  loratadine (CLARITIN) 10 MG tablet Take 10 mg by mouth daily.   Yes [provider]  oxyCODONE-acetaminophen (PERCOCET)  7.5-325 MG tablet Take one tab po bid for pain severe 07/02/22  Yes Abernathy, [name], NP  pantoprazole (PROTONIX) 40 MG tablet TAKE 1 TABLET BY MOUTH EVERY DAY 03/26/22  Yes [name], MD  potassium chloride (KLOR-CON) 10 MEQ tablet Take 4 tablets (40 mEq total) by mouth 2 (two) times daily. 10/06/21  Yes Abernathy, Yetta Flock, NP  predniSONE (DELTASONE) 10 MG tablet Take one tab 3 x day for 3 days, then take one tab 2 x a day for 3 days and then take one tab a day for 3 days for copd 07/13/22  Yes Abernathy, Yetta Flock, NP  sertraline (ZOLOFT) 100 MG tablet Take one tab a day for Anxiety 11/04/21  Yes [name], MD  traZODone (DESYREL) 50 MG tablet Take 1 tablet (50 mg total) by mouth at bedtime. 04/03/22  Yes [name], MD  VENTOLIN HFA 108 [phone] Base) MCG/ACT inhaler INHALE 2 PUFFS INTO THE LUNGS EVERY 6 HOURS AS NEEDED FOR WHEEZING OR SHORTNESS OF BREATH 06/11/22  Yes [name], MD  ondansetron (ZOFRAN-ODT) 4 MG disintegrating tablet Take 1 tablet (4 mg total) by mouth every 8 (eight) hours as needed for nausea or vomiting. 07/10/22   Carrie Mew, MD  sodium chloride (OCEAN)  0.65 % SOLN nasal spray Place 1 spray into both nostrils as needed for congestion.    [provider]   Review of Systems  Constitutional:  Positive for appetite change (decreased) and fatigue.  HENT:  Positive for hearing loss and voice change (hoarseness).   Eyes: Negative.   Respiratory:  Positive for cough (dry) and shortness of breath (easily with exertion).   Cardiovascular:  Positive for chest pain (intermittent). Negative for palpitations and leg swelling.  Gastrointestinal:  Positive for abdominal distention. Negative for abdominal pain and constipation.  Endocrine: Negative.   Genitourinary: Negative.   Musculoskeletal:  Positive for arthralgias (bilateral shoulder pain) and back pain (right lower back).  Skin: Negative.   Allergic/Immunologic: Negative.   Neurological:  Positive for light-headedness (at times). Negative for dizziness.  Hematological:  Negative for adenopathy. Does not bruise/bleed easily.  Psychiatric/Behavioral:  Positive for sleep disturbance (trouble sleeping due to shoulder pain; wearing cpap at bedtime). Negative for dysphoric mood. The patient is not nervous/anxious.    Vitals:   07/14/22 1131  BP: 121/86  Pulse: 74  Resp: 16  SpO2: 93%  Weight: 189 lb (85.7 kg)  Height: 5' (1.524 m)   Wt Readings from Last 3 Encounters:  07/14/22 189 lb (85.7 kg)  07/13/22 188 lb 9.6 oz (85.5 kg)  07/10/22 189 lb (85.7 kg)   Lab Results  Component Value Date   CREATININE 0.97 07/10/2022   CREATININE 0.92 05/05/2022   CREATININE 0.89 03/20/2022   Physical Exam Vitals and nursing note reviewed.  Constitutional:      Appearance: Normal appearance.  HENT:     Head: Normocephalic and atraumatic.     Left Ear: Decreased hearing noted.  Cardiovascular:     Rate and Rhythm: Normal rate and regular rhythm.  Pulmonary:     Effort: Pulmonary effort is normal. No tachypnea or respiratory distress.     Breath sounds: No wheezing or rales.  Abdominal:      General: There is no distension.     Palpations: Abdomen is soft.  Musculoskeletal:     Cervical back: Normal range of motion and neck supple.     Right lower leg: No edema.     Left lower leg:  No edema.  Skin:    General: Skin is warm and dry.  Neurological:     General: No focal deficit present.     Mental Status: She is alert and oriented to person, place, and time.  Psychiatric:        Mood and Affect: Mood is not anxious.        Behavior: Behavior normal.        Thought Content: Thought content normal.    Assessment & Plan:  1: Chronic heart failure with preserved ejection fraction without structural changes- - NYHA class III - euvolemic today - weighing daily; reminded to call for an overnight weight gain of >2 pounds or a weekly weight gain of >5 pounds - weight up 5 pounds from last visit here 2.5 months ago - not adding salt and is using pepper for seasoning; reviewed the importance of closely following a low sodium diet  - had telemedicine visit with cardiothoracic provider (Burk[place]) 07/23/21 for aortic valve; returns to him on 07/24/22 - has had previous aortic valve surgery 15 years ago - on GDMT of lisinopril & carvedilol & jardiance - BNP 07/10/22 was 120.8 - PharmD reconciled medications with the patient  2: HTN- - BP looks good (121/86) - saw PCP (Abernathy) 07/13/22 and was started on prednisone - BMP 07/10/22 reviewed and showed sodium 136, potassium 3.9, creatinine 0.97 and GFR >60  3: severe COPD- - wearing oxygen at 4L around the clock at home; when she comes to appointments, she has to put her portable tank on 3L/ this tank is pulse and she doesn't like it as much as her continuous oxygen at home - pulse ox on 3L was 93% - saw pulmonology Humphrey Rolls) 07/06/22  4: Atrial fibrillation-  - 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   Return in 4 months, sooner  if needed.

## 2022-07-14 NOTE — Telephone Encounter (Signed)
Called and notifed patient that she was deined for patient assistance for eliquis due to meeting out of pocket expenses and if she can prove that she met her amount from pharmacy and/or submit proof of income they would attempt again to get her approved however patient states she is tired, doesn't feel well and has given up at this point an she does not have it in her to try and fight it and to forget about it. I advised patient if she changes her mind to please let us know and we would be happy to help.  Cassandra Harbold, NT

## 2022-07-14 NOTE — Telephone Encounter (Signed)
Cardiologist appointment with Dr. Roxan Hockey 11/20-Toni

## 2022-07-16 ENCOUNTER — Ambulatory Visit: Payer: Medicare Other | Admitting: Nurse Practitioner

## 2022-07-17 DIAGNOSIS — Z7901 Long term (current) use of anticoagulants: Secondary | ICD-10-CM | POA: Diagnosis not present

## 2022-07-17 DIAGNOSIS — Z953 Presence of xenogenic heart valve: Secondary | ICD-10-CM | POA: Diagnosis not present

## 2022-07-17 DIAGNOSIS — Z950 Presence of cardiac pacemaker: Secondary | ICD-10-CM | POA: Diagnosis not present

## 2022-07-17 DIAGNOSIS — I4891 Unspecified atrial fibrillation: Secondary | ICD-10-CM | POA: Diagnosis not present

## 2022-07-23 ENCOUNTER — Telehealth: Payer: Self-pay

## 2022-07-23 NOTE — Telephone Encounter (Signed)
Pt called that her BP is low 89/42 and feeling ,tired and low grade fever as per Dr Humphrey Rolls advised that hold for Bp ned and call cardiologist and if she don't get touch with cardiology need to go to ED

## 2022-07-23 NOTE — Telephone Encounter (Signed)
Done

## 2022-07-24 DIAGNOSIS — I7121 Aneurysm of the ascending aorta, without rupture: Secondary | ICD-10-CM | POA: Diagnosis not present

## 2022-07-24 DIAGNOSIS — Q231 Congenital insufficiency of aortic valve: Secondary | ICD-10-CM | POA: Diagnosis not present

## 2022-07-24 DIAGNOSIS — Z952 Presence of prosthetic heart valve: Secondary | ICD-10-CM | POA: Diagnosis not present

## 2022-07-24 DIAGNOSIS — Z48812 Encounter for surgical aftercare following surgery on the circulatory system: Secondary | ICD-10-CM | POA: Diagnosis not present

## 2022-07-24 DIAGNOSIS — Z7901 Long term (current) use of anticoagulants: Secondary | ICD-10-CM | POA: Diagnosis not present

## 2022-07-24 DIAGNOSIS — I340 Nonrheumatic mitral (valve) insufficiency: Secondary | ICD-10-CM | POA: Diagnosis not present

## 2022-07-24 DIAGNOSIS — Z79899 Other long term (current) drug therapy: Secondary | ICD-10-CM | POA: Diagnosis not present

## 2022-07-24 DIAGNOSIS — Z87891 Personal history of nicotine dependence: Secondary | ICD-10-CM | POA: Diagnosis not present

## 2022-07-27 ENCOUNTER — Encounter: Payer: Medicare Other | Admitting: Thoracic Surgery (Cardiothoracic Vascular Surgery)

## 2022-07-27 DIAGNOSIS — R1011 Right upper quadrant pain: Secondary | ICD-10-CM | POA: Diagnosis not present

## 2022-07-27 DIAGNOSIS — K5900 Constipation, unspecified: Secondary | ICD-10-CM | POA: Diagnosis not present

## 2022-07-27 DIAGNOSIS — K8020 Calculus of gallbladder without cholecystitis without obstruction: Secondary | ICD-10-CM | POA: Diagnosis not present

## 2022-07-27 DIAGNOSIS — R82998 Other abnormal findings in urine: Secondary | ICD-10-CM | POA: Diagnosis not present

## 2022-08-02 DIAGNOSIS — G4733 Obstructive sleep apnea (adult) (pediatric): Secondary | ICD-10-CM | POA: Diagnosis not present

## 2022-08-03 ENCOUNTER — Emergency Department: Payer: Medicare Other

## 2022-08-03 ENCOUNTER — Telehealth: Payer: Self-pay | Admitting: Internal Medicine

## 2022-08-03 ENCOUNTER — Other Ambulatory Visit: Payer: Self-pay

## 2022-08-03 ENCOUNTER — Encounter: Payer: Self-pay | Admitting: Emergency Medicine

## 2022-08-03 DIAGNOSIS — J9621 Acute and chronic respiratory failure with hypoxia: Secondary | ICD-10-CM | POA: Diagnosis present

## 2022-08-03 DIAGNOSIS — E662 Morbid (severe) obesity with alveolar hypoventilation: Secondary | ICD-10-CM | POA: Diagnosis not present

## 2022-08-03 DIAGNOSIS — Z888 Allergy status to other drugs, medicaments and biological substances status: Secondary | ICD-10-CM

## 2022-08-03 DIAGNOSIS — R918 Other nonspecific abnormal finding of lung field: Secondary | ICD-10-CM | POA: Diagnosis present

## 2022-08-03 DIAGNOSIS — Z6839 Body mass index (BMI) 39.0-39.9, adult: Secondary | ICD-10-CM

## 2022-08-03 DIAGNOSIS — G8929 Other chronic pain: Secondary | ICD-10-CM

## 2022-08-03 DIAGNOSIS — Z79899 Other long term (current) drug therapy: Secondary | ICD-10-CM

## 2022-08-03 DIAGNOSIS — I110 Hypertensive heart disease with heart failure: Principal | ICD-10-CM | POA: Diagnosis present

## 2022-08-03 DIAGNOSIS — D649 Anemia, unspecified: Secondary | ICD-10-CM | POA: Diagnosis present

## 2022-08-03 DIAGNOSIS — I38 Endocarditis, valve unspecified: Secondary | ICD-10-CM | POA: Diagnosis not present

## 2022-08-03 DIAGNOSIS — J9601 Acute respiratory failure with hypoxia: Secondary | ICD-10-CM | POA: Diagnosis not present

## 2022-08-03 DIAGNOSIS — Z7901 Long term (current) use of anticoagulants: Secondary | ICD-10-CM | POA: Diagnosis not present

## 2022-08-03 DIAGNOSIS — I5033 Acute on chronic diastolic (congestive) heart failure: Secondary | ICD-10-CM | POA: Diagnosis present

## 2022-08-03 DIAGNOSIS — H9193 Unspecified hearing loss, bilateral: Secondary | ICD-10-CM | POA: Diagnosis not present

## 2022-08-03 DIAGNOSIS — F32A Depression, unspecified: Secondary | ICD-10-CM | POA: Diagnosis present

## 2022-08-03 DIAGNOSIS — E876 Hypokalemia: Secondary | ICD-10-CM | POA: Diagnosis present

## 2022-08-03 DIAGNOSIS — F411 Generalized anxiety disorder: Secondary | ICD-10-CM | POA: Diagnosis present

## 2022-08-03 DIAGNOSIS — Z823 Family history of stroke: Secondary | ICD-10-CM

## 2022-08-03 DIAGNOSIS — I4819 Other persistent atrial fibrillation: Secondary | ICD-10-CM | POA: Diagnosis not present

## 2022-08-03 DIAGNOSIS — K219 Gastro-esophageal reflux disease without esophagitis: Secondary | ICD-10-CM | POA: Diagnosis present

## 2022-08-03 DIAGNOSIS — I959 Hypotension, unspecified: Secondary | ICD-10-CM | POA: Diagnosis not present

## 2022-08-03 DIAGNOSIS — R0602 Shortness of breath: Secondary | ICD-10-CM | POA: Diagnosis not present

## 2022-08-03 DIAGNOSIS — I4892 Unspecified atrial flutter: Secondary | ICD-10-CM | POA: Diagnosis present

## 2022-08-03 DIAGNOSIS — Z886 Allergy status to analgesic agent status: Secondary | ICD-10-CM

## 2022-08-03 DIAGNOSIS — R0689 Other abnormalities of breathing: Secondary | ICD-10-CM | POA: Diagnosis not present

## 2022-08-03 DIAGNOSIS — T502X5A Adverse effect of carbonic-anhydrase inhibitors, benzothiadiazides and other diuretics, initial encounter: Secondary | ICD-10-CM | POA: Diagnosis present

## 2022-08-03 DIAGNOSIS — I480 Paroxysmal atrial fibrillation: Secondary | ICD-10-CM | POA: Diagnosis not present

## 2022-08-03 DIAGNOSIS — Z7984 Long term (current) use of oral hypoglycemic drugs: Secondary | ICD-10-CM

## 2022-08-03 DIAGNOSIS — Z881 Allergy status to other antibiotic agents status: Secondary | ICD-10-CM

## 2022-08-03 DIAGNOSIS — Z953 Presence of xenogenic heart valve: Secondary | ICD-10-CM | POA: Diagnosis not present

## 2022-08-03 DIAGNOSIS — J449 Chronic obstructive pulmonary disease, unspecified: Secondary | ICD-10-CM | POA: Diagnosis not present

## 2022-08-03 DIAGNOSIS — I509 Heart failure, unspecified: Secondary | ICD-10-CM | POA: Diagnosis not present

## 2022-08-03 DIAGNOSIS — E039 Hypothyroidism, unspecified: Secondary | ICD-10-CM | POA: Diagnosis present

## 2022-08-03 DIAGNOSIS — R0902 Hypoxemia: Secondary | ICD-10-CM | POA: Diagnosis not present

## 2022-08-03 DIAGNOSIS — Z79890 Hormone replacement therapy (postmenopausal): Secondary | ICD-10-CM

## 2022-08-03 DIAGNOSIS — Z9981 Dependence on supplemental oxygen: Secondary | ICD-10-CM

## 2022-08-03 DIAGNOSIS — R069 Unspecified abnormalities of breathing: Secondary | ICD-10-CM | POA: Diagnosis not present

## 2022-08-03 DIAGNOSIS — Z882 Allergy status to sulfonamides status: Secondary | ICD-10-CM

## 2022-08-03 DIAGNOSIS — E669 Obesity, unspecified: Secondary | ICD-10-CM | POA: Diagnosis not present

## 2022-08-03 DIAGNOSIS — Z954 Presence of other heart-valve replacement: Secondary | ICD-10-CM | POA: Diagnosis not present

## 2022-08-03 DIAGNOSIS — E785 Hyperlipidemia, unspecified: Secondary | ICD-10-CM | POA: Diagnosis present

## 2022-08-03 DIAGNOSIS — Z7951 Long term (current) use of inhaled steroids: Secondary | ICD-10-CM

## 2022-08-03 DIAGNOSIS — Z87891 Personal history of nicotine dependence: Secondary | ICD-10-CM | POA: Diagnosis not present

## 2022-08-03 DIAGNOSIS — I350 Nonrheumatic aortic (valve) stenosis: Secondary | ICD-10-CM | POA: Diagnosis not present

## 2022-08-03 DIAGNOSIS — Z90710 Acquired absence of both cervix and uterus: Secondary | ICD-10-CM

## 2022-08-03 LAB — CBC WITH DIFFERENTIAL/PLATELET
Abs Immature Granulocytes: 0.03 10*3/uL (ref 0.00–0.07)
Basophils Absolute: 0 10*3/uL (ref 0.0–0.1)
Basophils Relative: 1 %
Eosinophils Absolute: 0.2 10*3/uL (ref 0.0–0.5)
Eosinophils Relative: 3 %
HCT: 34.2 % — ABNORMAL LOW (ref 36.0–46.0)
Hemoglobin: 10.8 g/dL — ABNORMAL LOW (ref 12.0–15.0)
Immature Granulocytes: 1 %
Lymphocytes Relative: 19 %
Lymphs Abs: 1.2 10*3/uL (ref 0.7–4.0)
MCH: 27.9 pg (ref 26.0–34.0)
MCHC: 31.6 g/dL (ref 30.0–36.0)
MCV: 88.4 fL (ref 80.0–100.0)
Monocytes Absolute: 0.8 10*3/uL (ref 0.1–1.0)
Monocytes Relative: 13 %
Neutro Abs: 3.9 10*3/uL (ref 1.7–7.7)
Neutrophils Relative %: 63 %
Platelets: 234 10*3/uL (ref 150–400)
RBC: 3.87 MIL/uL (ref 3.87–5.11)
RDW: 16.4 % — ABNORMAL HIGH (ref 11.5–15.5)
WBC: 6.1 10*3/uL (ref 4.0–10.5)
nRBC: 0 % (ref 0.0–0.2)

## 2022-08-03 MED ORDER — OXYCODONE-ACETAMINOPHEN 7.5-325 MG PO TABS: ORAL_TABLET | ORAL | 0 refills | Status: DC

## 2022-08-03 NOTE — ED Provider Triage Note (Signed)
Emergency Medicine Provider Triage Evaluation Note  [name] , a 76 y.o. female  was evaluated in triage.  Pt complains of worsening shortness of breath.  Patient has a history of CHF and wears 4 L chronically.  No new oxygen demands.  Patient states that she took her diuretic as directed but did not have improvement in her symptoms.  No current chest pain or abdominal pain.  Review of Systems  Positive: Patient has SOB.  Negative: No vomiting or abdominal pain.   Physical Exam  Ht 5' (1.524 m)   Wt 86.3 kg   BMI 37.16 kg/m  Gen:   Awake, no distress   Resp:  Normal effort  MSK:   Moves extremities without difficulty   Medical Decision Making  Medically screening exam initiated at 8:47 PM.  Appropriate orders placed.  WENDE LONGSTRETH was informed that the remainder of the evaluation will be completed by another provider, this initial triage assessment does not replace that evaluation, and the importance of remaining in the ED until their evaluation is complete.     Vallarie Mare [place], [place] 08/03/22 2048

## 2022-08-03 NOTE — Telephone Encounter (Signed)
Patient lvm on after-hour line stating she has had hard time breathing x 4 days. Can barely walk. O2 dropping to 80 when walking. Per dfk instructions, I notified patient to go to ED and I scheduled her for 2:30 appointment on 08/04/22 if discharged from ED-Toni

## 2022-08-03 NOTE — ED Triage Notes (Signed)
Pt presents via POV with complaints of SOB since last night. Hx of CHF, wears 4L chronically. Pt notes taking her diuretic today but didn't notice much change in her sx. Denies CP.

## 2022-08-04 ENCOUNTER — Inpatient Hospital Stay
Admission: EM | Admit: 2022-08-04 | Discharge: 2022-08-07 | DRG: 291 | Disposition: A | Discharge status: Home / Self Care | Payer: Medicare Other | Attending: Internal Medicine | Admitting: Internal Medicine

## 2022-08-04 ENCOUNTER — Ambulatory Visit: Payer: Medicare Other | Admitting: Internal Medicine

## 2022-08-04 ENCOUNTER — Other Ambulatory Visit: Payer: Self-pay

## 2022-08-04 DIAGNOSIS — E876 Hypokalemia: Secondary | ICD-10-CM | POA: Diagnosis present

## 2022-08-04 DIAGNOSIS — I38 Endocarditis, valve unspecified: Secondary | ICD-10-CM

## 2022-08-04 DIAGNOSIS — E669 Obesity, unspecified: Secondary | ICD-10-CM | POA: Diagnosis not present

## 2022-08-04 DIAGNOSIS — Z79899 Other long term (current) drug therapy: Secondary | ICD-10-CM | POA: Diagnosis not present

## 2022-08-04 DIAGNOSIS — G8929 Other chronic pain: Secondary | ICD-10-CM | POA: Diagnosis present

## 2022-08-04 DIAGNOSIS — Z87891 Personal history of nicotine dependence: Secondary | ICD-10-CM | POA: Diagnosis not present

## 2022-08-04 DIAGNOSIS — E662 Morbid (severe) obesity with alveolar hypoventilation: Secondary | ICD-10-CM

## 2022-08-04 DIAGNOSIS — F32A Depression, unspecified: Secondary | ICD-10-CM | POA: Diagnosis present

## 2022-08-04 DIAGNOSIS — Z9981 Dependence on supplemental oxygen: Secondary | ICD-10-CM | POA: Diagnosis not present

## 2022-08-04 DIAGNOSIS — J449 Chronic obstructive pulmonary disease, unspecified: Secondary | ICD-10-CM | POA: Diagnosis present

## 2022-08-04 DIAGNOSIS — I509 Heart failure, unspecified: Secondary | ICD-10-CM | POA: Insufficient documentation

## 2022-08-04 DIAGNOSIS — R918 Other nonspecific abnormal finding of lung field: Secondary | ICD-10-CM | POA: Diagnosis present

## 2022-08-04 DIAGNOSIS — I480 Paroxysmal atrial fibrillation: Secondary | ICD-10-CM | POA: Diagnosis present

## 2022-08-04 DIAGNOSIS — H9193 Unspecified hearing loss, bilateral: Secondary | ICD-10-CM | POA: Diagnosis present

## 2022-08-04 DIAGNOSIS — I4819 Other persistent atrial fibrillation: Secondary | ICD-10-CM | POA: Diagnosis present

## 2022-08-04 DIAGNOSIS — E039 Hypothyroidism, unspecified: Secondary | ICD-10-CM | POA: Diagnosis present

## 2022-08-04 DIAGNOSIS — I110 Hypertensive heart disease with heart failure: Secondary | ICD-10-CM | POA: Diagnosis present

## 2022-08-04 DIAGNOSIS — T502X5A Adverse effect of carbonic-anhydrase inhibitors, benzothiadiazides and other diuretics, initial encounter: Secondary | ICD-10-CM | POA: Diagnosis present

## 2022-08-04 DIAGNOSIS — Z6841 Body Mass Index (BMI) 40.0 and over, adult: Secondary | ICD-10-CM

## 2022-08-04 DIAGNOSIS — I10 Essential (primary) hypertension: Secondary | ICD-10-CM | POA: Diagnosis present

## 2022-08-04 DIAGNOSIS — D649 Anemia, unspecified: Secondary | ICD-10-CM | POA: Diagnosis present

## 2022-08-04 DIAGNOSIS — Z7901 Long term (current) use of anticoagulants: Secondary | ICD-10-CM | POA: Diagnosis not present

## 2022-08-04 DIAGNOSIS — E785 Hyperlipidemia, unspecified: Secondary | ICD-10-CM | POA: Diagnosis present

## 2022-08-04 DIAGNOSIS — K219 Gastro-esophageal reflux disease without esophagitis: Secondary | ICD-10-CM | POA: Diagnosis present

## 2022-08-04 DIAGNOSIS — F411 Generalized anxiety disorder: Secondary | ICD-10-CM | POA: Diagnosis present

## 2022-08-04 DIAGNOSIS — I5033 Acute on chronic diastolic (congestive) heart failure: Secondary | ICD-10-CM | POA: Diagnosis present

## 2022-08-04 DIAGNOSIS — J9621 Acute and chronic respiratory failure with hypoxia: Secondary | ICD-10-CM | POA: Diagnosis present

## 2022-08-04 DIAGNOSIS — I4892 Unspecified atrial flutter: Secondary | ICD-10-CM | POA: Diagnosis present

## 2022-08-04 DIAGNOSIS — Z953 Presence of xenogenic heart valve: Secondary | ICD-10-CM

## 2022-08-04 DIAGNOSIS — I959 Hypotension, unspecified: Secondary | ICD-10-CM | POA: Diagnosis not present

## 2022-08-04 DIAGNOSIS — I350 Nonrheumatic aortic (valve) stenosis: Secondary | ICD-10-CM | POA: Diagnosis not present

## 2022-08-04 LAB — COMPREHENSIVE METABOLIC PANEL
ALT: 15 U/L (ref 0–44)
AST: 18 U/L (ref 15–41)
Albumin: 3.6 g/dL (ref 3.5–5.0)
Alkaline Phosphatase: 64 U/L (ref 38–126)
Anion gap: 8 (ref 5–15)
BUN: 16 mg/dL (ref 8–23)
CO2: 27 mmol/L (ref 22–32)
Calcium: 8.7 mg/dL — ABNORMAL LOW (ref 8.9–10.3)
Chloride: 102 mmol/L (ref 98–111)
Creatinine, Ser: 0.84 mg/dL (ref 0.44–1.00)
GFR, Estimated: >60 mL/min (ref >60)
Glucose, Bld: 118 mg/dL — ABNORMAL HIGH (ref 70–99)
Potassium: 3.3 mmol/L — ABNORMAL LOW (ref 3.5–5.1)
Sodium: 137 mmol/L (ref 135–145)
Total Bilirubin: 0.7 mg/dL (ref 0.3–1.2)
Total Protein: 6.7 g/dL (ref 6.5–8.1)

## 2022-08-04 LAB — BRAIN NATRIURETIC PEPTIDE: B Natriuretic Peptide: 256.9 pg/mL — ABNORMAL HIGH (ref 0.0–100.0)

## 2022-08-04 LAB — TROPONIN I (HIGH SENSITIVITY)
Troponin I (High Sensitivity): 15 ng/L (ref <18)
Troponin I (High Sensitivity): 15 ng/L (ref <18)

## 2022-08-04 MED ORDER — SODIUM CHLORIDE 0.9% FLUSH
3.0000 mL | Freq: Two times a day (BID) | INTRAVENOUS | Status: DC
  Administered 2022-08-04 – 2022-08-07 (×6): 3 mL via INTRAVENOUS

## 2022-08-04 MED ORDER — BUMETANIDE 0.25 MG/ML IJ SOLN: 2.0000 mg | Freq: Two times a day (BID) | INTRAMUSCULAR | Status: DC

## 2022-08-04 MED ORDER — IPRATROPIUM-ALBUTEROL 0.5-2.5 (3) MG/3ML IN SOLN: 3.0000 mL | RESPIRATORY_TRACT | Status: DC | PRN

## 2022-08-04 MED ORDER — ALPRAZOLAM 0.25 MG PO TABS: 0.2500 mg | ORAL_TABLET | Freq: Two times a day (BID) | ORAL | Status: DC | PRN

## 2022-08-04 MED ORDER — BUMETANIDE 0.25 MG/ML IJ SOLN
2.0000 mg | Freq: Two times a day (BID) | INTRAMUSCULAR | Status: AC
  Administered 2022-08-04 – 2022-08-05 (×2): 2 mg via INTRAVENOUS
  Filled 2022-08-04 (×2): qty 8

## 2022-08-04 MED ORDER — ONDANSETRON HCL 4 MG PO TABS: 4.0000 mg | ORAL_TABLET | Freq: Four times a day (QID) | ORAL | Status: DC | PRN

## 2022-08-04 MED ORDER — SERTRALINE HCL 50 MG PO TABS
100.0000 mg | ORAL_TABLET | Freq: Every day | ORAL | Status: DC
  Administered 2022-08-04 – 2022-08-07 (×4): 100 mg via ORAL
  Filled 2022-08-04 (×4): qty 2

## 2022-08-04 MED ORDER — LISINOPRIL 20 MG PO TABS
40.0000 mg | ORAL_TABLET | Freq: Every day | ORAL | Status: DC
  Administered 2022-08-05 – 2022-08-07 (×3): 40 mg via ORAL
  Filled 2022-08-04: qty 2
  Filled 2022-08-04: qty 4
  Filled 2022-08-04: qty 2

## 2022-08-04 MED ORDER — MOMETASONE FURO-FORMOTEROL FUM 100-5 MCG/ACT IN AERO
2.0000 | INHALATION_SPRAY | Freq: Two times a day (BID) | RESPIRATORY_TRACT | Status: DC
  Administered 2022-08-04 – 2022-08-07 (×6): 2 via RESPIRATORY_TRACT
  Filled 2022-08-04: qty 8.8

## 2022-08-04 MED ORDER — FERROUS SULFATE 325 (65 FE) MG PO TABS
325.0000 mg | ORAL_TABLET | Freq: Every day | ORAL | Status: DC
  Administered 2022-08-05 – 2022-08-07 (×3): 325 mg via ORAL
  Filled 2022-08-04 (×3): qty 1

## 2022-08-04 MED ORDER — PANTOPRAZOLE SODIUM 40 MG PO TBEC
40.0000 mg | DELAYED_RELEASE_TABLET | Freq: Every day | ORAL | Status: DC
  Administered 2022-08-04 – 2022-08-06 (×3): 40 mg via ORAL
  Filled 2022-08-04 (×3): qty 1

## 2022-08-04 MED ORDER — BUMETANIDE 0.25 MG/ML IJ SOLN
1.0000 mg | Freq: Once | INTRAMUSCULAR | Status: AC
  Administered 2022-08-04: 1 mg via INTRAVENOUS
  Filled 2022-08-04: qty 4

## 2022-08-04 MED ORDER — FUROSEMIDE 10 MG/ML IJ SOLN: 40.0000 mg | Freq: Two times a day (BID) | INTRAMUSCULAR | Status: DC

## 2022-08-04 MED ORDER — IPRATROPIUM-ALBUTEROL 0.5-2.5 (3) MG/3ML IN SOLN
3.0000 mL | Freq: Once | RESPIRATORY_TRACT | Status: AC
  Administered 2022-08-04: 3 mL via RESPIRATORY_TRACT
  Filled 2022-08-04: qty 3

## 2022-08-04 MED ORDER — POTASSIUM CHLORIDE CRYS ER 20 MEQ PO TBCR
40.0000 meq | EXTENDED_RELEASE_TABLET | Freq: Once | ORAL | Status: AC
  Administered 2022-08-04: 40 meq via ORAL
  Filled 2022-08-04: qty 2

## 2022-08-04 MED ORDER — TRAZODONE HCL 50 MG PO TABS
50.0000 mg | ORAL_TABLET | Freq: Every day | ORAL | Status: DC
  Administered 2022-08-04 – 2022-08-06 (×3): 50 mg via ORAL
  Filled 2022-08-04 (×3): qty 1

## 2022-08-04 MED ORDER — BUDESON-GLYCOPYRROL-FORMOTEROL 160-9-4.8 MCG/ACT IN AERO: 2.0000 | INHALATION_SPRAY | Freq: Two times a day (BID) | RESPIRATORY_TRACT | Status: DC

## 2022-08-04 MED ORDER — ONDANSETRON HCL 4 MG/2ML IJ SOLN
4.0000 mg | Freq: Four times a day (QID) | INTRAMUSCULAR | Status: DC | PRN
  Administered 2022-08-04: 4 mg via INTRAVENOUS
  Filled 2022-08-04: qty 2

## 2022-08-04 MED ORDER — OYSTER SHELL CALCIUM/D3 500-5 MG-MCG PO TABS
1.0000 | ORAL_TABLET | Freq: Every day | ORAL | Status: DC
  Administered 2022-08-04 – 2022-08-07 (×4): 1 via ORAL
  Filled 2022-08-04 (×4): qty 1

## 2022-08-04 MED ORDER — OXYCODONE-ACETAMINOPHEN 7.5-325 MG PO TABS
1.0000 | ORAL_TABLET | Freq: Two times a day (BID) | ORAL | Status: DC | PRN
  Administered 2022-08-04 – 2022-08-07 (×7): 1 via ORAL
  Filled 2022-08-04 (×7): qty 1

## 2022-08-04 MED ORDER — APIXABAN 5 MG PO TABS
5.0000 mg | ORAL_TABLET | Freq: Two times a day (BID) | ORAL | Status: DC
  Administered 2022-08-04 – 2022-08-07 (×7): 5 mg via ORAL
  Filled 2022-08-04 (×7): qty 1

## 2022-08-04 MED ORDER — SODIUM CHLORIDE 0.9% FLUSH: 3.0000 mL | INTRAVENOUS | Status: DC | PRN

## 2022-08-04 MED ORDER — CARVEDILOL 6.25 MG PO TABS
6.2500 mg | ORAL_TABLET | Freq: Two times a day (BID) | ORAL | Status: DC
  Administered 2022-08-05 – 2022-08-07 (×5): 6.25 mg via ORAL
  Filled 2022-08-04 (×6): qty 1

## 2022-08-04 MED ORDER — AMLODIPINE BESYLATE 5 MG PO TABS: 10.0000 mg | ORAL_TABLET | Freq: Every day | ORAL | Status: DC

## 2022-08-04 MED ORDER — POTASSIUM CHLORIDE CRYS ER 20 MEQ PO TBCR
40.0000 meq | EXTENDED_RELEASE_TABLET | Freq: Two times a day (BID) | ORAL | Status: DC
  Administered 2022-08-04 – 2022-08-05 (×4): 40 meq via ORAL
  Filled 2022-08-04 (×4): qty 2

## 2022-08-04 MED ORDER — UMECLIDINIUM BROMIDE 62.5 MCG/ACT IN AEPB
1.0000 | INHALATION_SPRAY | Freq: Every day | RESPIRATORY_TRACT | Status: DC
  Administered 2022-08-05 – 2022-08-07 (×3): 1 via RESPIRATORY_TRACT
  Filled 2022-08-04: qty 7

## 2022-08-04 MED ORDER — LEVOTHYROXINE SODIUM 25 MCG PO TABS
25.0000 ug | ORAL_TABLET | Freq: Every day | ORAL | Status: DC
  Administered 2022-08-05 – 2022-08-07 (×3): 25 ug via ORAL
  Filled 2022-08-04 (×2): qty 1

## 2022-08-04 MED ORDER — ATORVASTATIN CALCIUM 20 MG PO TABS
40.0000 mg | ORAL_TABLET | Freq: Every day | ORAL | Status: DC
  Administered 2022-08-04 – 2022-08-06 (×3): 40 mg via ORAL
  Filled 2022-08-04 (×3): qty 2

## 2022-08-04 MED ORDER — SODIUM CHLORIDE 0.9 % IV SOLN: 250.0000 mL | INTRAVENOUS | Status: DC | PRN

## 2022-08-04 NOTE — Assessment & Plan Note (Addendum)
With chronic respiratory failure on 4 L of oxygen continuous Stable and not acutely exacerbated Continue as needed bronchodilator therapy and inhaled steroids Continue oxygen supplementation to maintain pulse oximetry greater than 92%

## 2022-08-04 NOTE — ED Provider Notes (Signed)
[facility] Provider Note    Event Date/Time   First MD Initiated Contact with Patient 08/04/22 [phone]     (approximate)   History   Shortness of Breath   HPI  [name] is a 76 y.o. female past medical history of aortic stenosis status post aortic valve replacement with bioprosthetic valve, paroxysmal A-fib on Eliquis hypertension hyperlipidemia who presents with shortness of breath.  Patient endorses increased dyspnea especially on exertion and with laying flat over the last 3 to 4 days.  She has no significant cough fevers or chills.  Does have some bilateral back and rib pain that is not exertional.  She endorses increased swelling of her abdominal girth denies lower extremity swelling.  Tells me that she typically does not have swelling in the legs and that it is always in her abdomen.  Denies fevers or chills.  She has been desatting at home on her chronic 4 L down to the low 80s.  I see that she saw CT surgery on 11//13, had an echo that showed increased gradient across her valve and she is scheduled for a TAVR CT scan and is supposed to see Dr. Jenne Pane.  Patient has been compliant with her medications.     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 s/p bioprosthetic valve replacement 2008 at [facility];  b. 01/2015 Echo: EF 60-65%, no rwma, Gr1 DD, mildly dil LA, nl RV fxn.   Aspiration pneumonia ([place])    Atrial fibrillation with RVR ([place]) 01/11/2015   Atrial flutter ([place])    a. 08/2016 s/p DCCV.  Remains on flecainide 50 mg bid.   Basal skull fracture ([name]) 20 yrs ago   CHF (congestive heart failure) ([name])    Chronic respiratory failure ([name])    COPD (chronic obstructive pulmonary disease) ([place])    a. 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 2008 prior to Aortic aneurysm repair-->nl  cors.   History of stress test    a. 10/2015 MV: no ischemia/infarct.   HLD (hyperlipidemia)    HTN (hypertension)    Hypothyroidism    Obesity    PAF (paroxysmal atrial fibrillation) ([name])    a. on Eliquis; b. CHADS2VASc = 3 (HTN, age x 82, female).   Paroxysmal atrial fibrillation ([name]) 01/24/2015   Right upper quadrant abdominal tenderness without rebound tenderness 03/02/2018   S/P ascending aortic aneurysm repair 2008    Patient Active Problem List   Diagnosis Date Noted   Chronic upper back pain 04/14/2022   Chronic myofascial pain 04/14/2022   Musculoskeletal disorder involving upper trapezius muscle 04/14/2022   Abnormal CT scan, cervical spine (03/04/2022) 04/01/2022   Trigger point of shoulder region (Bilateral) 04/01/2022   Trigger point with neck pain 04/01/2022   DISH (diffuse idiopathic skeletal hyperostosis) 03/12/2022   Atrial fibrillation, chronic ([place]) 01/26/2022   Morbid obesity ([place]) 01/26/2022   Chronic respiratory failure with hypoxia ([place]) 01/26/2022   Bilateral pneumonia 01/25/2022   Chronic intractable headache 47/65/4650   Acute metabolic encephalopathy 35/46/5681   Chronic diastolic CHF (congestive heart failure) ([place]) 12/02/2021   HLD (hyperlipidemia) 12/02/2021   Stroke ([place]) 12/02/2021   Iron deficiency anemia 12/02/2021   Depression 12/02/2021   Acute on chronic respiratory failure  with hypoxia ([place]) 11/11/2021   Unable to maintain body in lying position 10/16/2021   Orthopnea 10/16/2021   Class 2 obesity with alveolar hypoventilation, serious comorbidity, and body mass index (BMI) of 39.0 to 39.9 in adult ([place]) 10/16/2021   At high risk for postoperative complications 28/31/5176   Hearing loss 08/12/2021   Long term prescription benzodiazepine use 04/30/2021   Chronic shoulder pain (1ry area of Pain) (Bilateral) (L>R) 04/30/2021   Chronic upper extremity pain (2ry area of Pain) (Bilateral) (L>R) 04/30/2021   Cervicalgia 04/30/2021   Chronic  neck pain (3ry area of Pain) (Posterior) (Bilateral) (L>R) 04/30/2021   Shoulder blade pain (4th area of Pain) (Left) 04/30/2021   Osteoarthritis of glenohumeral joint (Left) 04/30/2021   Osteoarthritis of AC (acromioclavicular) joint (Left) 04/30/2021   Osteoarthritis of glenohumeral joints (Bilateral) 04/30/2021   Osteoarthritis of acromioclavicular joints (Bilateral) 04/30/2021   Primary osteoarthritis of shoulders (Bilateral) 04/30/2021   DDD (degenerative disc disease), cervical 04/30/2021   Cervical radiculitis (Left) 04/30/2021   Cervical radiculopathy (Left) 04/30/2021   C6 radiculopathy (Left) 04/30/2021   C7 radiculopathy (Left) 04/30/2021   Wheelchair dependence 04/30/2021   Chronic pain syndrome 04/29/2021   Pharmacologic therapy 04/29/2021   Disorder of skeletal system 04/29/2021   Problems influencing health status 04/29/2021   Chronic anticoagulation (Eliquis) 03/14/2021   Hx of atrioventricular node ablation 03/14/2021   Symptomatic anemia 01/29/2021   Acute on chronic diastolic CHF (congestive heart failure) ([place]) 05/11/2020   Chronic obstructive pulmonary disease (COPD) ([place])    Hypothyroidism    Cardiac pacemaker in situ 05/10/2020   Acute non-recurrent frontal sinusitis 04/22/2020   Vertigo 04/22/2020   S/P placement of cardiac pacemaker 02/21/2020   Atrial fibrillation status post cardioversion ([place]) 11/16/2019   Episode of moderate major depression ([place]) 10/10/2019   Persistent atrial fibrillation ([place])    Encounter for general adult medical examination with abnormal findings 07/10/2019   Encounter for screening mammogram for malignant neoplasm of breast 07/10/2019   Atopic dermatitis 05/02/2019   Paroxysmal atrial flutter ([place]) 08/11/2018   Encounter for long-term (current) use of medications 07/09/2018   Chronic left shoulder pain 07/09/2018   Conjunctivitis 07/09/2018   Oxygen dependent 07/09/2018   GAD (generalized anxiety disorder) 07/09/2018    Ovarian failure 07/09/2018   Positive colorectal cancer screening using Cologuard test 04/24/2018   Calculus of gallbladder with acute on chronic cholecystitis 04/08/2018   H/O: CVA (cerebrovascular accident) 04/08/2018   Dysuria 03/02/2018   Right upper quadrant abdominal tenderness without rebound tenderness 03/02/2018   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 exacerbation 03/02/2018   Chronic obstructive pulmonary disease ([place]) 09/19/2017   Typical atrial flutter ([place])    Irritable bowel syndrome without diarrhea 01/29/2015   Essential hypertension 01/24/2015   Paroxysmal atrial fibrillation ([place]) 01/24/2015   History of aortic valve replacement with bioprosthetic valve 01/24/2015   Atrial fibrillation with RVR ([place]) 01/11/2015   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 05/18/2014   Shoulder impingement syndrome (Left) 05/18/2014   Cellulitis and abscess 02/13/2014   MRSA (methicillin resistant Staphylococcus aureus) 02/13/2014   Aneurysm, ascending aorta ([place]) 06/23/2013   Bicuspid aortic valve 06/23/2013     Physical Exam  Triage Vital Signs: ED Triage Vitals  Enc Vitals Group     BP 08/03/22 2045 138/76     Pulse Rate 08/03/22 2045 79     Resp 08/03/22 2045 20     Temp 08/03/22 2045 98.4 F (36.9 C)     Temp Source 08/03/22 2045  Oral     SpO2 08/03/22 2045 94 %     Weight 08/03/22 2044 190 lb 4.1 oz (86.3 kg)     Height 08/03/22 2044 5' (1.524 m)     Head Circumference --      Peak Flow --      Pain Score 08/03/22 2044 6     Pain Loc --      Pain Edu? --      Excl. in [place]? --     Most recent vital signs: Vitals:   08/04/22 0355 08/04/22 0453  BP: 127/62   Pulse: 72   Resp: (!) 21   Temp: 98 F (36.7 C)   SpO2: 97% 93%     General: Awake, no distress.  CV:  Good peripheral perfusion.  No pitting edema in the lower extremities Resp:  Patient is tachypneic especially with exertion, lungs are clear no wheezing Abd:  Abdomen is distended but  nontender Neuro:             Awake, Alert, Oriented x 3  Other:     ED Results / Procedures / Treatments  Labs (all labs ordered are listed, but only abnormal results are displayed) Labs Reviewed  CBC WITH DIFFERENTIAL/PLATELET - Abnormal; Notable for the following components:      Result Value   Hemoglobin 10.8 (*)    HCT 34.2 (*)    RDW 16.4 (*)    All other components within normal limits  COMPREHENSIVE METABOLIC PANEL - Abnormal; Notable for the following components:   Potassium 3.3 (*)    Glucose, Bld 118 (*)    Calcium 8.7 (*)    All other components within normal limits  BRAIN NATRIURETIC PEPTIDE - Abnormal; Notable for the following components:   B Natriuretic Peptide 256.9 (*)    All other components within normal limits  TROPONIN I (HIGH SENSITIVITY)  TROPONIN I (HIGH SENSITIVITY)  TROPONIN I (HIGH SENSITIVITY)     EKG  EKG shows a paced rhythm no Sgar[place]ossa criteria to suggest ischemia   RADIOLOGY Chest x-ray shows bibasilar scarring   PROCEDURES:  Critical Care performed: Yes, see critical care procedure note(s)  .Critical Care  Performed by: Rada Hay, MD Authorized by: Rada Hay, MD   Critical care provider statement:    Critical care time (minutes):  30   Critical care was time spent personally by me on the following activities:  Development of treatment plan with patient or surrogate, discussions with consultants, evaluation of patient's response to treatment, examination of patient, ordering and review of laboratory studies, ordering and review of radiographic studies, ordering and performing treatments and interventions, pulse oximetry, re-evaluation of patient's condition and review of old charts    [facility]: Medications  ipratropium-albuterol (DUONEB) 0.5-2.5 (3) MG/3ML nebulizer solution 3 mL (has no administration in time range)  potassium chloride SA (KLOR-CON M) CR tablet 40 mEq (40 mEq Oral Given  08/04/22 0509)  bumetanide (BUMEX) injection 1 mg (1 mg Intravenous Given 08/04/22 0510)     IMPRESSION / MDM / [place] / ED COURSE  I reviewed the triage vital signs and the nursing notes.                              Patient's presentation is most consistent with acute presentation with potential threat to life or bodily function.  Differential diagnosis includes, but is not limited to, CHF exacerbation,  valve dysfunction, pneumonia, COPD exacerbation, viral illness, pulmonary embolism  The patient is a 76 year old female who presents with increasing dyspnea on exertion and orthopnea for the last 4 days.  She has not had fevers cough.  Does have some quite atypical chest/upper back pain.  She is on Eliquis and is compliant.  Follows with CT surgery and has had recent echo with increased gradient across the valve and is supposed to have a TAVR CT scan.  Patient complains of increasing abdominal girth but no significant lower extremity swelling.  On my initial evaluation she satting about 94% on her 4 L which is her baseline O2 requirement.  When she ambulates to the bathroom and back she does see desat to 88%.  Patient's chest x-ray does not show obvious pulmonary edema.  BNP elevated at 260 she is mildly hypokalemic troponins x 2 are negative CBC overall nonrevealing.  Patient was given a dose of Bumex and potassium was supplemented.  Will also give a DuoNeb.  Overall I suspect that she is having mild CHF exacerbation.  Given she desats with ambulation she will require admission.       FINAL CLINICAL IMPRESSION(S) / ED DIAGNOSES   Final diagnoses:  Acute on chronic congestive heart failure, unspecified heart failure type ([place])     Rx / DC Orders   ED Discharge Orders     None        Note:  This document was prepared using Dragon voice recognition software and may include unintentional dictation errors.   Rada Hay, MD 08/04/22 [id number]

## 2022-08-04 NOTE — H&P (Signed)
History and Physical    Patient: [name] NFA:[id number] DOB: 01/24/1946 DOA: 08/04/2022 DOS: the patient was seen and examined on 08/04/2022 PCP: [name], MD  Patient coming from: Home  Chief Complaint:  Chief Complaint  Patient presents with   Shortness of Breath   HPI: [name] is a 76 y.o. female with medical history significant aortic stenosis status post bioprosthetic valve replacement in 2008, history of A-fib on chronic anticoagulation therapy, history of chronic diastolic dysfunction CHF, COPD with chronic respiratory failure on 4 L of oxygen continuous, hypothyroidism, obesity who presents to the emergency room for evaluation of worsening shortness of breath with exertion from her baseline associated with increased abdominal girth and orthopnea for about 4 days. She states that she has to stop and catch her breath with minimal form of exertion and that she checks her pulse oximetry even on her 4 L of oxygen she has pulse oximetry in the 80s.  On the night prior to her admission she was unable to lay flat due to shortness of breath.  She has been compliant with her diuretics and denies having any dietary indiscretion.  She thinks she may have gained weight but is unable to tell me how much.  She denies any leg swelling, fever, no chills, no cough, no headache, no urinary symptoms, no changes in her bowel habits, no headache, no blurred vision, no focal deficit. Labs show a BNP of 256, potassium of 3.3 Chest x-ray showsmild diffuse reticular opacity with hazy ground-glass opacity at the bases likely related to chronic lung disease on prior CT Twelve-lead EKG shows paced rhythm  Review of Systems: As mentioned in the history of present illness. All other systems reviewed and are negative. 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 s/p bioprosthetic valve replacement 2008 at [facility];  b. 01/2015 Echo: EF 60-65%, no rwma, Gr1  DD, mildly dil LA, nl RV fxn.   Aspiration pneumonia ([place])    Atrial fibrillation with RVR ([place]) 01/11/2015   Atrial flutter ([place])    a. 08/2016 s/p DCCV.  Remains on flecainide 50 mg bid.   Basal skull fracture ([name]) 20 yrs ago   CHF (congestive heart failure) ([name])    Chronic respiratory failure ([name])    COPD (chronic obstructive pulmonary disease) ([place])    a. 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 2008 prior to Aortic aneurysm repair-->nl cors.   History of stress test    a. 10/2015 MV: no ischemia/infarct.   HLD (hyperlipidemia)    HTN (hypertension)    Hypothyroidism    Obesity    PAF (paroxysmal atrial fibrillation) ([name])    a. on Eliquis; b. CHADS2VASc = 3 (HTN, age x 10, female).   Paroxysmal atrial fibrillation ([name]) 01/24/2015   Right upper quadrant abdominal tenderness without rebound tenderness 03/02/2018   S/P ascending aortic aneurysm repair 2008   Past Surgical History:  Procedure Laterality Date   ABDOMINAL AORTIC ANEURYSM REPAIR  2008   ABDOMINAL HYSTERECTOMY     AORTIC VALVE REPLACEMENT  2008   CARDIAC CATHETERIZATION     [facility]   CARDIOVERSION N/A 08/15/2018   Procedure: CARDIOVERSION;  Surgeon: [name], MD;  Location: [facility] ORS;  Service: Cardiovascular;  Laterality: N/A;  CARDIOVERSION N/A 11/14/2018   Procedure: CARDIOVERSION (CATH LAB);  Surgeon: [name], MD;  Location: [facility] ORS;  Service: Cardiovascular;  Laterality: N/A;   CARDIOVERSION N/A 06/05/2019   Procedure: CARDIOVERSION;  Surgeon: [name], MD;  Location: [place] [facility];  Service: Cardiovascular;  Laterality: N/A;   CARDIOVERSION N/A 08/14/2019   Procedure: CARDIOVERSION;  Surgeon: [name], MD;  Location: [place] [facility];  Service: Cardiovascular;  Laterality: N/A;   CARDIOVERSION N/A 11/09/2019   Procedure: CARDIOVERSION;  Surgeon: Isaias Cowman, MD;   Location: [place] [facility];  Service: Cardiovascular;  Laterality: N/A;   CARDIOVERSION N/A 01/17/2020   Procedure: CARDIOVERSION;  Surgeon: Isaias Cowman, MD;  Location: [facility];  Service: Cardiovascular;  Laterality: N/A;   CARPAL TUNNEL RELEASE     ELECTROPHYSIOLOGIC STUDY N/A 08/17/2016   Procedure: Cardioversion;  Surgeon: [name], MD;  Location: [facility] ORS;  Service: Cardiovascular;  Laterality: N/A;   TUMOR EXCISION Left    x3 (arm)   Social History:  reports that she has quit smoking. Her smoking use included cigarettes. She has never used smokeless tobacco. She reports that she does not drink alcohol and does not use drugs.  Allergies  Allergen Reactions   Benadryl [Diphenhydramine Hcl (Sleep)] Palpitations   Cetirizine Palpitations   Lasix [Furosemide] Rash   Levaquin [Levofloxacin In D5w] Other (See Comments)    Reaction:  Fatigue and muscle soreness   Meloxicam Rash   Soy Allergy Hives and Nausea And Vomiting   Sulfa Antibiotics Rash    Family History  Problem Relation Age of Onset   Stroke Father    Stroke Paternal Grandmother     Prior to Admission medications   Medication Sig Start Date End Date Taking? Authorizing Provider  ALPRAZolam Duanne Moron) 0.25 MG tablet TAKE 1 TABLET(0.25 MG) BY MOUTH TWICE DAILY AS NEEDED FOR ANXIETY 06/08/22   [name], MD  amLODipine (NORVASC) 10 MG tablet Take 10 mg by mouth daily.    [provider]  apixaban (ELIQUIS) 5 MG TABS tablet Take 1 tablet (5 mg total) by mouth 2 (two) times daily. 11/04/21   [name], MD  atorvastatin (LIPITOR) 40 MG tablet TAKE 1 TABLET BY MOUTH EVERY NIGHT AT BEDTIME 03/26/22   [name], MD  Budeson-Glycopyrrol-Formoterol (BREZTRI AEROSPHERE) 160-9-4.8 MCG/ACT AERO Inhale 2 puffs into the lungs 2 (two) times daily. 01/22/22   Jonetta Osgood, NP  bumetanide (BUMEX) 1 MG tablet Take 2 tablets (2 mg total) by mouth daily. And additional 2 tablets PM PRN 04/27/22   Darylene Price A, FNP   Calcium Carbonate-Vitamin D (CALCIUM 600+D PO) Take 1 tablet by mouth daily.    [provider]  carvedilol (COREG) 6.25 MG tablet Take 1 tablet (6.25 mg total) by mouth 2 (two) times daily with a meal. 12/03/21   Dwyane Dee, MD  Coenzyme Q10 (COQ10) 200 MG CAPS Take 200 mg by mouth daily.    [provider]  diclofenac Sodium (VOLTAREN) 1 % GEL SMARTSIG:Gram(s) Topical Twice Daily 09/09/20   [provider]  empagliflozin (JARDIANCE) 10 MG TABS tablet Take 1 tablet (10 mg total) by mouth daily before breakfast. 04/27/22   Alisa Graff, FNP  ferrous sulfate 325 (65 FE) MG tablet Take 1 tablet (325 mg total) by mouth daily with breakfast. 12/03/21   Dwyane Dee, MD  ipratropium-albuterol (DUONEB) 0.5-2.5 (3) MG/3ML SOLN Inhale 3 mLs into the lungs every 4 (four) hours as needed. Inhale 3 mls into the lungs every  4 to 6 hours and as needed 01/29/22   Allyne Gee, MD  levothyroxine (SYNTHROID) 25 MCG tablet TAKE 1 TABLET BY MOUTH DAILY BEFORE BREAKFAST 03/22/22   [name], MD  lisinopril (ZESTRIL) 40 MG tablet TAKE 1 TABLET(40 MG) BY MOUTH DAILY(DOSE INCREASE) 09/29/21   Alisa Graff, FNP  loratadine (CLARITIN) 10 MG tablet Take 10 mg by mouth daily.    [provider]  ondansetron (ZOFRAN-ODT) 4 MG disintegrating tablet Take 1 tablet (4 mg total) by mouth every 8 (eight) hours as needed for nausea or vomiting. 07/10/22   Carrie Mew, MD  oxyCODONE-acetaminophen (PERCOCET) 7.5-325 MG tablet Take one tab po bid for pain severe 08/03/22   Jonetta Osgood, NP  pantoprazole (PROTONIX) 40 MG tablet TAKE 1 TABLET BY MOUTH EVERY DAY 03/26/22   [name], MD  potassium chloride (KLOR-CON) 10 MEQ tablet Take 4 tablets (40 mEq total) by mouth 2 (two) times daily. 10/06/21   Jonetta Osgood, NP  predniSONE (DELTASONE) 10 MG tablet Take one tab 3 x day for 3 days, then take one tab 2 x a day for 3 days and then take one tab a day for 3 days for copd  07/13/22   Jonetta Osgood, NP  sertraline (ZOLOFT) 100 MG tablet Take one tab a day for Anxiety 11/04/21   [name], MD  sodium chloride (OCEAN) 0.65 % SOLN nasal spray Place 1 spray into both nostrils as needed for congestion.    [provider]  traZODone (DESYREL) 50 MG tablet Take 1 tablet (50 mg total) by mouth at bedtime. 04/03/22   [name], MD  VENTOLIN HFA 108 [phone] Base) MCG/ACT inhaler INHALE 2 PUFFS INTO THE LUNGS EVERY 6 HOURS AS NEEDED FOR WHEEZING OR SHORTNESS OF BREATH 06/11/22   [name], MD    Physical Exam: Vitals:   08/03/22 2045 08/04/22 0324 08/04/22 0355 08/04/22 0453  BP: 138/76 (!) 146/72 127/62   Pulse: 79 70 72   Resp: 20 18 (!) 21   Temp: 98.4 F (36.9 C) 98.2 F (36.8 C) 98 F (36.7 C)   TempSrc: Oral Oral Oral   SpO2: 94% 96% 97% 93%  Weight:      Height:       Physical Exam Vitals and nursing note reviewed.  Constitutional:      Appearance: She is obese.     Comments: Chronically ill-appearing  HENT:     Head: Normocephalic.     Mouth/Throat:     Mouth: Mucous membranes are moist.  Eyes:     Pupils: Pupils are equal, round, and reactive to light.  Cardiovascular:     Rate and Rhythm: Normal rate and regular rhythm.  Pulmonary:     Effort: Tachypnea present.     Breath sounds: Examination of the right-lower field reveals rales. Examination of the left-lower field reveals rales. Rales present.  Abdominal:     General: Bowel sounds are normal.     Palpations: Abdomen is soft.     Comments: Central adiposity  Musculoskeletal:        General: Normal range of motion.     Cervical back: Normal range of motion and neck supple.  Skin:    General: Skin is warm and dry.  Neurological:     General: No focal deficit present.  Psychiatric:        Mood and Affect: Mood normal.        Behavior: Behavior normal.  Data Reviewed: Relevant notes from primary care and specialist visits, past discharge summaries as available in  EHR, including Care Everywhere. Prior diagnostic testing as pertinent to current admission diagnoses Updated medications and problem lists for reconciliation ED course, including vitals, labs, imaging, treatment and response to treatment Triage notes, nursing and pharmacy notes and ED provider's notes Notable results as noted in HPI Labs reviewed.  Troponin 15, BNP 256, sodium 137, potassium 3.3, chloride 102, bicarb 27, glucose 118, BUN 16, creatinine 0.84, calcium 8.7, total protein 6.7, albumin 3.6, AST 18, ALT 15, alkaline phosphatase 64, total bilirubin 0.7, white count 6.1, hemoglobin 10.8, hematocrit 34.2, platelet count 234 There are no new results to review at this time.  Assessment and Plan: * Heart failure due to valvular disease, acute on chronic, diastolic ([name]) Patient presents to the ER for evaluation of worsening shortness of breath with minimal exertion associated with orthopnea and increased abdominal girth She also has worsening hypoxia on her baseline home oxygen supplementation at 4 L BNP slightly elevated Patient had a recent 2D echocardiogram which showed normal LV systolic function with mild LVH, elevated LA pressures with diastolic dysfunction.  Compared to prior echo study from 2021 significant increase in AV gradients, small LV cavity and high dimensionless index suggesting underfilling of the left ventricle which may be the cause of the higher aortic valve gradients. Patient has been seen by CT surgery Place patient on Lasix 40 mg IV every 12 Continue carvedilol and lisinopril We will consult cardiology   Class 2 obesity with alveolar hypoventilation, serious comorbidity, and body mass index (BMI) of 39.0 to 39.9 in adult [facility]) Complicates overall prognosis and care Lifestyle modification and exercise has been discussed with patient in detail  Depression Continue sertraline  Essential hypertension Continue carvedilol, amlodipine and  lisinopril  Hypothyroidism Continue Synthroid  Hypokalemia Secondary to diuretic therapy Supplement potassium  GAD (generalized anxiety disorder) Continue alprazolam  Chronic obstructive pulmonary disease ([name]) With chronic respiratory failure on 4 L of oxygen continuous Stable and not acutely exacerbated Continue as needed bronchodilator therapy and inhaled steroids Continue oxygen supplementation to maintain pulse oximetry greater than 92%  History of aortic valve replacement with bioprosthetic valve Patient has a history of aortic valve stenosis and is status post aortic valve replacement with bioprosthetic valve. Recent 2D echocardiogram is suggestive of worsening valvular heart disease and patient has been seen by CT surgery Follow-up with CT surgery as an outpatient  Paroxysmal atrial fibrillation ([name]) Continue carvedilol for rate control Continue Eliquis as primary prophylaxis for an acute stroke      Advance Care Planning:   Code Status: Full Code   Consults: Cardiology  Family Communication: Greater than 50 sent of time was spent discussing patient's condition and plan of care with her at the bedside.  All questions and concerns have been addressed.  She verbalizes understanding and agrees with the plan.  Severity of Illness: The appropriate patient status for this patient is INPATIENT. Inpatient status is judged to be reasonable and necessary in order to provide the required intensity of service to ensure the patient's safety. The patient's presenting symptoms, physical exam findings, and initial radiographic and laboratory data in the context of their chronic comorbidities is felt to place them at high risk for further clinical deterioration. Furthermore, it is not anticipated that the patient will be medically stable for discharge from the hospital within 2 midnights of admission.   * I certify that at the point of admission it is  my clinical judgment that the  patient will require inpatient hospital care spanning beyond 2 midnights from the point of admission due to high intensity of service, high risk for further deterioration and high frequency of surveillance required.*  Author: Collier Bullock, MD 08/04/2022 10:30 AM  For on call review [email or web address].

## 2022-08-04 NOTE — Assessment & Plan Note (Signed)
Continue Synthroid °

## 2022-08-04 NOTE — Assessment & Plan Note (Signed)
Secondary to diuretic therapy  Supplement potassium 

## 2022-08-04 NOTE — Assessment & Plan Note (Signed)
Continue sertraline 

## 2022-08-04 NOTE — Assessment & Plan Note (Signed)
Continue alprazolam 

## 2022-08-04 NOTE — Assessment & Plan Note (Signed)
Complicates overall prognosis and care ?Lifestyle modification and exercise has been discussed with patient in detail ?

## 2022-08-04 NOTE — Consult Note (Signed)
[place] NOTE       Patient ID: [name] MRN: [id number] DOB/AGE: 1946-04-23 76 y.o.  Admit date: 08/04/2022 Referring Physician Dr. Francine Graven Primary Physician Dr. Clayborn Bigness  Primary Cardiologist Dr. 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 Cardiology is consulted for assistance with her heart failure.   Patient was seen in her CT surgeon's office on 07/24/2022 where she had a surveillance echo performed that showed an increased gradient across her AV (40 mmHg) for which she a TAVR CT scan was recommended for further evaluation and follow-up with Dr. Jenne Pane.  The patient has severe COPD on 4 L continuously with chronic dyspnea on exertion.  She presented to [facility] ED today with worsening of her chronic dyspnea on exertion and orthopnea 3 to 4 days and was reportedly hypoxic to the low 80s on her baseline oxygen with ambulation, that recovers after several minutes of rest.  She called [facility] cardiology yesterday with complaints of elevated blood pressure (SBP 180s) increased peripheral edema and shortness of breath she was advised to restart her lisinopril, and take an additional 2 mg of Bumex on top of her baseline of 2 mg twice daily with appropriate potassium supplementation with ER precautions.  She reports she did this without good response, thus prompting her presentation to the emergency department.  She notes that she had a CT scan of her lungs recently that showed an area of suspicion for which biopsy was recommended.  The patient reports her cardiothoracic surgeon likely wants to wait until this biopsy is  performed before further redo AVR.  She understands that she is a high risk surgical candidate and says she "cannot be put to sleep."  At my time of evaluation she is sitting upright in a wheelchair after being moved to a new room in the emergency department.  She notes generalized abdominal pain from "her gallbladder" and her back.  She has not been eating much due to nausea from this, but notes she ate some fried rice and pizza yesterday.  She has abdominal distention and increased "bloating" and is not sure if she has gained weight recently.  No chest pain or palpitations.  Reports 2 recent bowel movements today, well-formed, no diarrhea.  Blood pressure is 96/82, SpO2 is 97% at rest on her baseline 4 L by nasal cannula.  Labs are notable for hypokalemia with potassium 3.3, LFTs within normal limits, BUN/creatinine 16/0.4 and GFR greater than 60.  High-sensitivity troponin is negative at 15-15.  BNP is elevated at 257, it was notably 120 when checked 3 weeks ago.   Review of systems complete and found to be negative unless listed above     Past Medical History:  Diagnosis Date   Aortic 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 s/p bioprosthetic valve replacement 2008 at [facility];  b. 01/2015 Echo: EF 60-65%, no rwma, Gr1 DD, mildly dil LA, nl RV fxn.   Aspiration pneumonia ([place])    Atrial fibrillation with RVR ([place]) 01/11/2015   Atrial flutter ([place])    a. 08/2016 s/p DCCV.  Remains on flecainide 50 mg bid.  Basal skull fracture ([name]) 20 yrs ago   CHF (congestive heart failure) ([name])    Chronic respiratory failure ([name])    COPD (chronic obstructive pulmonary disease) ([place])    a. 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 2008 prior to Aortic aneurysm repair-->nl cors.   History of stress test    a. 10/2015 MV: no ischemia/infarct.   HLD (hyperlipidemia)    HTN (hypertension)     Hypothyroidism    Obesity    PAF (paroxysmal atrial fibrillation) ([name])    a. on Eliquis; b. CHADS2VASc = 3 (HTN, age x 85, female).   Paroxysmal atrial fibrillation ([name]) 01/24/2015   Right upper quadrant abdominal tenderness without rebound tenderness 03/02/2018   S/P ascending aortic aneurysm repair 2008    Past Surgical History:  Procedure Laterality Date   ABDOMINAL AORTIC ANEURYSM REPAIR  2008   ABDOMINAL HYSTERECTOMY     AORTIC VALVE REPLACEMENT  2008   CARDIAC CATHETERIZATION     [facility]   CARDIOVERSION N/A 08/15/2018   Procedure: CARDIOVERSION;  Surgeon: [name], MD;  Location: [facility] ORS;  Service: Cardiovascular;  Laterality: N/A;   CARDIOVERSION N/A 11/14/2018   Procedure: CARDIOVERSION (CATH LAB);  Surgeon: [name], MD;  Location: [facility] ORS;  Service: Cardiovascular;  Laterality: N/A;   CARDIOVERSION N/A 06/05/2019   Procedure: CARDIOVERSION;  Surgeon: [name], MD;  Location: [facility] ORS;  Service: Cardiovascular;  Laterality: N/A;   CARDIOVERSION N/A 08/14/2019   Procedure: CARDIOVERSION;  Surgeon: [name], MD;  Location: [facility] ORS;  Service: Cardiovascular;  Laterality: N/A;   CARDIOVERSION N/A 11/09/2019   Procedure: CARDIOVERSION;  Surgeon: Isaias Cowman, MD;  Location: [facility] ORS;  Service: Cardiovascular;  Laterality: N/A;   CARDIOVERSION N/A 01/17/2020   Procedure: CARDIOVERSION;  Surgeon: Isaias Cowman, MD;  Location: [facility] ORS;  Service: Cardiovascular;  Laterality: N/A;   CARPAL TUNNEL RELEASE     ELECTROPHYSIOLOGIC STUDY N/A 08/17/2016   Procedure: Cardioversion;  Surgeon: [name], MD;  Location: [facility] ORS;  Service: Cardiovascular;  Laterality: N/A;   TUMOR EXCISION Left    x3 (arm)    (Not in a hospital admission)  Social History   Socioeconomic History   Marital status: Single    Spouse name: Not on file   Number of children: Not on file   Years of education: Not on file   Highest education level: Not on  file  Occupational History   Not on file  Tobacco Use   Smoking status: Former    Types: Cigarettes   Smokeless tobacco: Never  Vaping Use   Vaping Use: Never used  Substance and Sexual Activity   Alcohol use: No   Drug use: No   Sexual activity: Never    Birth control/protection: Surgical  Other Topics Concern   Not on file  Social History Narrative   Not on file   Social Determinants of Health   Financial Resource Strain: Low Risk  (08/04/2021)   Overall Financial Resource Strain (CARDIA)    Difficulty of Paying Living Expenses: Not very hard  Food Insecurity: Not on file  Transportation Needs: Not on file  Physical Activity: Not on file  Stress: Not on file  Social Connections: Not on file  Intimate Partner Violence: Not on file    Family History  Problem Relation Age of Onset   Stroke Father    Stroke Paternal Grandmother      Vitals:   08/03/22 2045 08/04/22 0324 08/04/22 0355 08/04/22 0453  BP: 138/76 (!) 146/72 127/62   Pulse: 79 70 72   Resp: 20 18 (!) 21   Temp: 98.4 F (36.9 C) 98.2 F (36.8 C) 98 F (36.7 C)   TempSrc: Oral Oral Oral   SpO2: 94% 96% 97% 93%  Weight:      Height:        PHYSICAL EXAM General: Elderly chronically ill-appearing Caucasian female, well nourished, in no acute distress.  Sitting upright in wheelchair. HEENT:  Normocephalic and atraumatic.  Hard of hearing in the left ear, reads lips well. Neck:  No JVD.  Lungs: Normal respiratory effort on 4 L by [place].  Decreased breath sounds with rhonchi throughout.   Heart: HRRR . Normal S1 and S2, 3/6 systolic murmur best heard at the RUSB.  Abdomen: Non-distended appearing.  Msk: Normal strength and tone for age. Extremities: Warm and well perfused. No clubbing, cyanosis.  No peripheral edema.  Neuro: Alert and oriented X 3. Psych:  Answers questions appropriately.   Labs: Basic Metabolic Panel: Recent Labs    08/03/22 2327  NA 137  K 3.3*  CL 102  CO2 27  GLUCOSE 118*   BUN 16  CREATININE 0.84  CALCIUM 8.7*   Liver Function Tests: Recent Labs    08/03/22 2327  AST 18  ALT 15  ALKPHOS 64  BILITOT 0.7  PROT 6.7  ALBUMIN 3.6   No results for input(s): "LIPASE", "AMYLASE" in the last 72 hours. CBC: Recent Labs    08/03/22 2327  WBC 6.1  NEUTROABS 3.9  HGB 10.8*  HCT 34.2*  MCV 88.4  PLT 234   Cardiac Enzymes: Recent Labs    08/03/22 2327 08/04/22 0238  TROPONINIHS 15 15   BNP: Recent Labs    08/03/22 2327  BNP 256.9*   D-Dimer: No results for input(s): "DDIMER" in the last 72 hours. Hemoglobin A1C: No results for input(s): "HGBA1C" in the last 72 hours. Fasting Lipid Panel: No results for input(s): "CHOL", "HDL", "LDLCALC", "TRIG", "CHOLHDL", "LDLDIRECT" in the last 72 hours. Thyroid Function Tests: No results for input(s): "TSH", "T4TOTAL", "T3FREE", "THYROIDAB" in the last 72 hours.  Invalid input(s): "FREET3" Anemia Panel: No results for input(s): "VITAMINB12", "FOLATE", "FERRITIN", "TIBC", "IRON", "RETICCTPCT" in the last 72 hours.   Radiology: DG Chest 2 View  Result Date: 08/03/2022 CLINICAL DATA:  Shortness of breath EXAM: CHEST - 2 VIEW COMPARISON:  07/10/2022, chest CT 06/02/2022, 12/02/2021 FINDINGS: Post sternotomy changes and valve prosthesis. Left-sided pacing device. Mild diffuse reticular opacity with hazy ground-glass opacity at the bases likely related to chronic lung disease on prior CT. No confluent acute airspace disease. IMPRESSION: Mild diffuse reticular opacity with hazy ground-glass opacity at the bases likely related to chronic lung disease on prior CT. No definite acute superimposed airspace disease Electronically Signed   By: Donavan Foil M.D.   On: 08/03/2022 21:30   US Abdomen Limited RUQ (LIVER/GB)  Result Date: 07/10/2022 CLINICAL DATA:  Epigastric pain EXAM: ULTRASOUND ABDOMEN LIMITED RIGHT UPPER QUADRANT COMPARISON:  None Available. FINDINGS: Gallbladder: Numerous small gallstones  visualized. No gallbladder wall thickening. No sonographic Murphy sign noted by sonographer. Common bile duct: Diameter: 3.8 mm Liver: No focal lesion identified. Normal parenchymal echogenicity. Portal vein is patent on color Doppler imaging with normal direction of blood flow towards the liver.  Other: None. IMPRESSION: Cholelithiasis without sonographic evidence of acute cholecystitis. Electronically Signed   By: Yetta Glassman M.D.   On: 07/10/2022 18:01   DG Chest 2 View  Result Date: 07/10/2022 CLINICAL DATA:  Shortness of breath EXAM: CHEST - 2 VIEW COMPARISON:  Chest radiograph November 27, 2021 and CT June 02, 2022 FINDINGS: Left chest pacemaker with leads projecting over the right atrial appendage and right ventricle. Aortic valve prosthesis. Enlarged cardiac silhouette. Prior median sternotomy. Aortic atherosclerosis. Central vascular prominence. Bibasilar predominant interstitial opacities. No visible pleural effusion or pneumothorax. Right axillary surgical clips. Degenerative changes spine and bilateral shoulders. IMPRESSION: Enlarged cardiac silhouette with central vascular prominence and bibasilar predominant interstitial opacities superimposed on chronic lung changes, possibly reflecting pulmonary edema. Electronically Signed   By: Dahlia Bailiff M.D.   On: 07/10/2022 17:33    ECHO 07/24/2022 2D DIMENSIONS  AORTA          Values     Normal RangeMAIN PA      Values     Normal Range       Annulus:  nm*  cm    [1.9 - 2.7]    PA Main:   2.1 cm    [1.5 - 2.1]     Aorta Sin:   2.2 cm    [2.4 - 3.6] RIGHT VENTRICLE   ST Junction:  nm*  cm    [2 - 3.2]      RV Base:   3.4 cm    [2.5 - 4.1]     Asc.Aorta:   2.9 cm    [1.9 - 3.5]     RV Mid:   2.4 cm    [1.9 - 3.5]  LEFT VENTRICLE                         RV Length:  nm*  cm    [  ]         LVIDd:   3.8 cm    [3.7 - 5.3] RIGHT ATRIUM         LVIDs:   2.3 cm    [2.2 - 3.4]    RA Area:  21   cm2   [ <= 20]        LVEDVi:  nm*  ml/m2 [29 - 61]          RAVi:  33   ml/m2 [15 - 27]        LVESVi:  nm*  ml/m2 [8 - 24]    INFERIOR VENA CAVA            FS:  39   %     [ >= 25]       Max.IVC:   1.7 cm    [ <= 2.1]           SWT:   1.2 cm    [0.6 - 0.9]    Min.IVC:  nm*  cm    [ <= 1.7]           PWT:   1.2 cm    [0.6 - 0.9] __________________  LEFT ATRIUM                           nm* - not measured       LA Diam:   4.5 cm    [2.7 - 3.8]       LA Area:  27  cm2   [ <= 20]     LA Volume:  74   ml    [22 - 52]          LAVi:  40   ml/m2 [16 - 34]   ECHOCARDIOGRAPHIC DESCRIPTIONS -----------------------------------------------  AORTIC ROOT          Size: Normal    Dissection: INDETERM FOR DISSECTION   AORTIC VALVE      Leaflets: BIOPROSTHETIC         Morphology: Normal      Mobility: Fully Mobile      AOV Note:  56mm   LEFT VENTRICLE                                      Anterior: Normal          Size: Normal                                 Lateral: Normal   Contraction: Normal                                  Septal: Normal    Closest EF: >55% (Estimated)                        Apical: Normal     LV masses: No Masses                             Inferior: Normal           LVH: MILD LVH CONCENTRIC                  Posterior: Normal  Dias.FxClass: RELAXATION ABNORMALITY (GRADE 2) CORRESPONDS TO PSEUDONORMAL   MITRAL VALVE      Leaflets: Normal                  Mobility: Fully mobile    Morphology: Normal   LEFT ATRIUM          Size: MILDLY ENLARGED     LA masses: No masses                Normal IAS   MAIN PA          Size: Normal   PULMONIC VALVE    Morphology: Normal      Mobility: Fully Mobile       PV Note:   PAAT= 70ms   RIGHT VENTRICLE          Size: Normal                    Free wall: Normal   Contraction: Normal                    RV masses: No Masses         TAPSE:   1.0 cm,  Normal Range [>= 1.6 cm]       RV Note: TV S'= 0.28m/s   TRICUSPID VALVE      Leaflets: Normal                  Mobility: Fully  mobile    Morphology: Normal   RIGHT ATRIUM  Size: Normal                     RA Other: None     RA masses: No masses   PERICARDIUM        Fluid: No effusion   INFERIOR VENACAVA          Size: Normal     Normal respiratory collapse   DOPPLER ECHO and OTHER SPECIAL PROCEDURES ------------------------------------     Aortic: TRIVIAL AR             BIOPROSTHETIC AoV      4.0 m/s peak vel   65 mmHg peak grad  41 mmHg mean grad 0.9 cm2 by DOPPLER   LVOT Diam: 1.9 cm. Resting LVOT Vel: 1.4 m/s. Dimensionless Index: 0.32      Mitral: MILD MR                No MS     MV Inflow E Vel.= 74.0 cm/s  MV Annulus E'Vel.= 4.0 cm/s  E/E'Ratio= 19   Tricuspid: TRIVIAL TR             No TS             2.7 m/s peak TR vel   29 mmHg peak RV pressure   Pulmonary: TRIVIAL PR             No PS       Other:   INTERPRETATION ---------------------------------------------------------------    NORMAL LEFT VENTRICULAR SYSTOLIC FUNCTION WITH MILD LVH    ELEVATED LA PRESSURES WITH DIASTOLIC DYSFUNCTION    NORMAL RIGHT VENTRICULAR SYSTOLIC FUNCTION    VALVULAR REGURGITATION: TRIVIAL AR, MILD MR, TRIVIAL PR, TRIVIAL TR    PROSTHETIC VALVE(S): BIOPROSTHETIC AoV    UNABLE TO OBTAIN IV ACCESS FOR USE OF DEFINITY     Compared with prior Echo study on 02/07/2020: SIGNIFICANT INCREASE IN AV    GRADIENTS, SMALLER LV CAVITY  AND HIGH DIMENSIONLESS INDEX SUGGESTS    UNDERFILLING OF THE LV MAY BE THE CAUSE OF THE HIGHER AOV GRADIENTS.   TELEMETRY reviewed by me (LT) 08/04/2022 : AV paced 74  EKG reviewed by me: AV paced rhythm 76  Data reviewed by me (LT) 08/04/2022: [facility] CTS note 07/24/2022, last pulmonology note, echo report, ED note, admission H&P, CBC BMP BNP vitals telemetry  Principal Problem:   Heart failure due to valvular disease, acute on chronic, diastolic ([name]) Active Problems:   Essential hypertension   Paroxysmal atrial fibrillation ([name])   History of aortic valve replacement with  bioprosthetic valve   Chronic obstructive pulmonary disease ([name])   GAD (generalized anxiety disorder)   Hypothyroidism   Class 2 obesity with alveolar hypoventilation, serious comorbidity, and body mass index (BMI) of 39.0 to 39.9 in adult [facility])   Depression   Hypokalemia    ASSESSMENT AND PLAN:  Toma E. 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 Cardiology is consulted for assistance with her heart failure.   # Acute on chronic hypoxic respiratory failure  # suspicious lung mass  Reportedly desaturating with exertion to the 80s on her baseline of 4 L.  She had an abnormal PET/CT showing a RUL nodule that is  pending biopsy followed by pulmonology.  # Acute on chronic HFpEF # Bicuspid AV s/p bioprosthetic AVR (2008) Reports shortness of breath that is multifactorial, with orthopnea that was refractory to increased dose of Bumex outpatient. Echo performed 11/17 at [place] showed preserved EF but increased AV gradient (58mmHg) for which TAVR CT is planned. BNP elevated above her baseline at 256, compared to 120 when it was checked 3 weeks ago.  Reports early satiety and "bloating" that she feels is related to gallbladder issues and previous constipation.  She denies dietary indiscretions, but notes she ate some fried rice and pizza recently. -S/p IV Bumex 1 mg agree with continuing 2 mg IV twice daily for now. -Monitor and replete electrolytes -Continue GDMT with coreg 6.25mg  BID & Jardiance 10mg . Hold lisinopril 40 today with borderline hypotension. Consider addition of spiro if BP allows  -recommend continued follow up with Dr. Jenne Pane outpatient for further redo AVR eval   # paroxysmal AF s/p AVN ablation & DC  PPM  AV paced rhythm on tele.  -continue coreg 6.25 BID  -continue eliquis 5mg  BID for stroke prevention  This patient's plan of care was discussed and created with Dr. Clayborn Bigness and he is in agreement.  Signed: Tristan Schroeder , PA-C 08/04/2022, 12:32 PM [facility] Card[facility]

## 2022-08-04 NOTE — Assessment & Plan Note (Signed)
Patient has a history of aortic valve stenosis and is status post aortic valve replacement with bioprosthetic valve. Recent 2D echocardiogram is suggestive of worsening valvular heart disease and patient has been seen by CT surgery Follow-up with CT surgery as an outpatient

## 2022-08-04 NOTE — Assessment & Plan Note (Signed)
Patient presents to the ER for evaluation of worsening shortness of breath with minimal exertion associated with orthopnea and increased abdominal girth She also has worsening hypoxia on her baseline home oxygen supplementation at 4 L BNP slightly elevated Patient had a recent 2D echocardiogram which showed normal LV systolic function with mild LVH, elevated LA pressures with diastolic dysfunction.  Compared to prior echo study from 2021 significant increase in AV gradients, small LV cavity and high dimensionless index suggesting underfilling of the left ventricle which may be the cause of the higher aortic valve gradients. Patient has been seen by CT surgery Place patient on Lasix 40 mg IV every 12 Continue carvedilol and lisinopril We will consult cardiology

## 2022-08-04 NOTE — Assessment & Plan Note (Signed)
Continue carvedilol, amlodipine and lisinopril

## 2022-08-04 NOTE — ED Notes (Signed)
Pt assisted to restroom via wheelchair and ambulated to toilet. Pt wheeled back to room and assisted back into bed, sats did drop to 88% when getting back into bed. MD notified.

## 2022-08-04 NOTE — Assessment & Plan Note (Signed)
Continue carvedilol for rate control Continue Eliquis as primary prophylaxis for an acute stroke

## 2022-08-05 ENCOUNTER — Encounter: Payer: Self-pay | Admitting: Internal Medicine

## 2022-08-05 DIAGNOSIS — I38 Endocarditis, valve unspecified: Secondary | ICD-10-CM | POA: Diagnosis not present

## 2022-08-05 DIAGNOSIS — I350 Nonrheumatic aortic (valve) stenosis: Secondary | ICD-10-CM

## 2022-08-05 DIAGNOSIS — E669 Obesity, unspecified: Secondary | ICD-10-CM

## 2022-08-05 DIAGNOSIS — I5033 Acute on chronic diastolic (congestive) heart failure: Secondary | ICD-10-CM | POA: Diagnosis not present

## 2022-08-05 LAB — BASIC METABOLIC PANEL
Anion gap: 8 (ref 5–15)
BUN: 17 mg/dL (ref 8–23)
CO2: 23 mmol/L (ref 22–32)
Calcium: 9.1 mg/dL (ref 8.9–10.3)
Chloride: 107 mmol/L (ref 98–111)
Creatinine, Ser: 0.88 mg/dL (ref 0.44–1.00)
GFR, Estimated: >60 mL/min (ref >60)
Glucose, Bld: 99 mg/dL (ref 70–99)
Potassium: 4.4 mmol/L (ref 3.5–5.1)
Sodium: 138 mmol/L (ref 135–145)

## 2022-08-05 LAB — CBC
HCT: 34.8 % — ABNORMAL LOW (ref 36.0–46.0)
Hemoglobin: 10.9 g/dL — ABNORMAL LOW (ref 12.0–15.0)
MCH: 27.5 pg (ref 26.0–34.0)
MCHC: 31.3 g/dL (ref 30.0–36.0)
MCV: 87.9 fL (ref 80.0–100.0)
Platelets: 221 10*3/uL (ref 150–400)
RBC: 3.96 MIL/uL (ref 3.87–5.11)
RDW: 17.1 % — ABNORMAL HIGH (ref 11.5–15.5)
WBC: 5 10*3/uL (ref 4.0–10.5)
nRBC: 0 % (ref 0.0–0.2)

## 2022-08-05 MED ORDER — ACETAMINOPHEN 325 MG PO TABS
650.0000 mg | ORAL_TABLET | Freq: Four times a day (QID) | ORAL | Status: DC | PRN
  Administered 2022-08-05: 650 mg via ORAL
  Filled 2022-08-05: qty 2

## 2022-08-05 MED ORDER — DOCUSATE SODIUM 100 MG PO CAPS
200.0000 mg | ORAL_CAPSULE | Freq: Two times a day (BID) | ORAL | Status: DC
  Administered 2022-08-05 – 2022-08-07 (×3): 200 mg via ORAL
  Filled 2022-08-05 (×4): qty 2

## 2022-08-05 MED ORDER — BUMETANIDE 0.25 MG/ML IJ SOLN: 2.0000 mg | Freq: Two times a day (BID) | INTRAMUSCULAR | Status: DC

## 2022-08-05 NOTE — Consult Note (Signed)
   Heart Failure Nurse Navigator Note  HFpEF greater than 55%.  Grade 2 diastolic dysfunction.  She presented to the emergency room with complaints of worsening shortness of breath, orthopnea and increasing abdominal girth.  BNP was 256.  Comorbidities:  Aortic valve replacement Atrial fibrillation with RVR Atrial flutter COPD on home O2 Hypertension Hyperlipidemia Insertion of permanent pacemaker AV node ablation  Medications:  Apixaban 5 mg 2 times a day Atorvastatin 40 mg at at bedtime Ferrous sulfate 325 mg daily Levothyroxine 25 mcg daily Lisinopril 40 mg daily Potassium chloride 40 mEq twice a day  Labs:  Sodium 138, potassium 4.4, chloride 107, CO2 23, BUN 17, creatinine 0.88 Blood pressure 132/69 Weight 86.3 kg   Initial meeting with patient in the emergency room.  She is awake and alert sitting up in the chair at bedside in no acute distress.  She states at home that she had been weighing herself daily, she stated that 1 day she might be up 2 pounds and then the next day should be down a pound.  She feels that her dry weight is 185 prior to admission she had weight 188 pounds.  Discussed fluid restriction, she states that she feels she probably drinks a gallon of liquid daily.  She said it takes a lot of water to get her potassium pills down.  Stressed the importance of sticking with the fluid restriction of no more than 64 ounces daily, of all fluids.   Went over diet, she states that she can no longer go to the grocery store to shop so she is ordering online.  She mentioned getting individual frozen pizzas and also fixing rice.  I asked her if she had been reading labels and aware of the sodium content on the foods that she is eating.  States now that she had not.  Gust getting into the habit of reading labels going over 2000 mg of sodium daily.   States that she is compliant with her medications.  She currently has a granddaughter that lives with her.  Patient  continues to drive herself to her appointments, but states that she does not like to drive after dark.  Aware that she has follow-up in the outpatient heart failure clinic on December 5 at 4 PM.  States that she has an appoint with her PCP on December 6.  I offered to change her appointment so she would have to back to back and she states that she would like them left the way they are.   Pricilla Riffle RN [facility]

## 2022-08-05 NOTE — Progress Notes (Addendum)
Patient was seen evaluated and examined by me and the PA on 08/05/22.  Course of action, evaluation, and management decisions were developed solely by me, but detailed below in the PA's note. [place] NOTE       Patient ID: YEIMY BRABANT MRN: [id number] DOB/AGE: 1946-04-19 76 y.o.  Admit date: 08/04/2022 Referring Physician Dr. Francine Graven Primary Physician Dr. Clayborn Bigness  Primary Cardiologist Dr. 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 Cardiology is consulted for assistance with her heart failure.   Interval History: - slept well last night, upset she wasn't woken up when her breakfast arrived. Urinating ok with IV bumex. - hard for the patient to tell if she feels any less short of breath (hasn't moved around much)  - no chest pain.   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 s/p bioprosthetic valve replacement 2008 at [facility];  b. 01/2015 Echo: EF 60-65%, no rwma, Gr1 DD, mildly dil LA, nl RV fxn.   Aspiration pneumonia ([place])    Atrial fibrillation with RVR ([place]) 01/11/2015   Atrial flutter ([place])    a. 08/2016 s/p DCCV.  Remains on flecainide 50 mg bid.   Basal skull fracture ([name]) 20 yrs ago   CHF (congestive heart failure) ([name])    Chronic respiratory failure ([name])    COPD (chronic obstructive pulmonary disease) ([place])    a. 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 2008 prior  to Aortic aneurysm repair-->nl cors.   History of stress test    a. 10/2015 MV: no ischemia/infarct.   HLD (hyperlipidemia)    HTN (hypertension)    Hypothyroidism    Obesity    PAF (paroxysmal atrial fibrillation) ([name])    a. on Eliquis; b. CHADS2VASc = 3 (HTN, age x 68, female).   Paroxysmal atrial fibrillation ([name]) 01/24/2015   Right upper quadrant abdominal tenderness without rebound tenderness 03/02/2018   S/P ascending aortic aneurysm repair 2008    Past Surgical History:  Procedure Laterality Date   ABDOMINAL AORTIC ANEURYSM REPAIR  2008   ABDOMINAL HYSTERECTOMY     AORTIC VALVE REPLACEMENT  2008   CARDIAC CATHETERIZATION     [facility]   CARDIOVERSION N/A 08/15/2018   Procedure: CARDIOVERSION;  Surgeon: [name], MD;  Location: [facility] ORS;  Service: Cardiovascular;  Laterality: N/A;   CARDIOVERSION N/A 11/14/2018   Procedure: CARDIOVERSION (CATH LAB);  Surgeon: [name], MD;  Location: [place] [facility]S;  Service: Cardiovascular;  Laterality: N/A;   CARDIOVERSION N/A 06/05/2019   Procedure: CARDIOVERSION;  Surgeon: [name], MD;  Location: [place];  Service: Cardiovascular;  Laterality: N/A;   CARDIOVERSION N/A 08/14/2019   Procedure: CARDIOVERSION;  Surgeon: [name], MD;  Location: [facility] ORS;  Service: Cardiovascular;  Laterality: N/A;   CARDIOVERSION N/A 11/09/2019   Procedure: CARDIOVERSION;  Surgeon: Isaias Cowman, MD;  Location: [place] ORS;  Service: Cardiovascular;  Laterality: N/A;   CARDIOVERSION N/A 01/17/2020   Procedure: CARDIOVERSION;  Surgeon: Isaias Cowman, MD;  Location: [facility] ORS;  Service: Cardiovascular;  Laterality: N/A;   CARPAL TUNNEL RELEASE     ELECTROPHYSIOLOGIC STUDY N/A 08/17/2016   Procedure: Cardioversion;  Surgeon: [name], MD;  Location: [facility] ORS;  Service: Cardiovascular;  Laterality: N/A;   TUMOR EXCISION Left    x3 (arm)    (Not in a hospital admission)  Social History   Socioeconomic History   Marital  status: Single    Spouse name: Not on file   Number of children: Not on file   Years of education: Not on file   Highest education level: Not on file  Occupational History   Not on file  Tobacco Use   Smoking status: Former    Types: Cigarettes   Smokeless tobacco: Never  Vaping Use   Vaping Use: Never used  Substance and Sexual Activity   Alcohol use: No   Drug use: No   Sexual activity: Never    Birth control/protection: Surgical  Other Topics Concern   Not on file  Social History Narrative   Not on file   Social Determinants of Health   Financial Resource Strain: Low Risk  (08/04/2021)   Overall Financial Resource Strain (CARDIA)    Difficulty of Paying Living Expenses: Not very hard  Food Insecurity: No Food Insecurity (08/05/2022)   Hunger Vital Sign    Worried About Running Out of Food in the Last Year: Never true    Ran Out of Food in the Last Year: Never true  Transportation Needs: No Transportation Needs (08/05/2022)   PRAPARE - Hydrologist (Medical): No    Lack of Transportation (Non-Medical): No  Physical Activity: Not on file  Stress: Not on file  Social Connections: Not on file  Intimate Partner Violence: Not At Risk (08/05/2022)   Humiliation, Afraid, Rape, and Kick questionnaire    Fear of Current or Ex-Partner: No    Emotionally Abused: No    Physically Abused: No    Sexually Abused: No    Family History  Problem Relation Age of Onset   Stroke Father    Stroke Paternal Grandmother      Vitals:   08/05/22 0133 08/05/22 0428 08/05/22 0800 08/05/22 0943  BP: (!) 108/56 109/60 126/62 132/69  Pulse: 70 70 70   Resp: 18 20 20    Temp:  97.6 F (36.4 C)    TempSrc:  Oral    SpO2: 98% 99% 100%   Weight:      Height:        PHYSICAL EXAM General: Elderly chronically ill-appearing Caucasian female, well nourished, in no acute distress.  Sitting upright with legs off bed HEENT:  Normocephalic and atraumatic.  Hard of  hearing in the left ear, reads lips well. Neck:  No JVD.  Lungs: Normal respiratory effort on 4 L by [place].  Decreased breath sounds without appreciable crackles or wheezes Heart: HRRR . Normal S1 and S2, 3/6 systolic murmur best heard at the RUSB.  Abdomen: tenderness to palpation in the epigastrium & RUQ without rebound or guarding. Excess adiposity.  Msk: Normal strength and tone for age. Extremities: Warm and well perfused. No clubbing, cyanosis.  No peripheral edema.  Neuro: Alert and oriented X 3. Psych:  Answers questions appropriately.   Labs: Basic Metabolic Panel: Recent Labs  08/03/22 2327 08/05/22 0532  NA 137 138  K 3.3* 4.4  CL 102 107  CO2 27 23  GLUCOSE 118* 99  BUN 16 17  CREATININE 0.84 0.88  CALCIUM 8.7* 9.1    Liver Function Tests: Recent Labs    08/03/22 2327  AST 18  ALT 15  ALKPHOS 64  BILITOT 0.7  PROT 6.7  ALBUMIN 3.6    No results for input(s): "LIPASE", "AMYLASE" in the last 72 hours. CBC: Recent Labs    08/03/22 2327 08/05/22 0532  WBC 6.1 5.0  NEUTROABS 3.9  --   HGB 10.8* 10.9*  HCT 34.2* 34.8*  MCV 88.4 87.9  PLT 234 221    Cardiac Enzymes: Recent Labs    08/03/22 2327 08/04/22 0238  TROPONINIHS 15 15    BNP: Recent Labs    08/03/22 2327  BNP 256.9*    D-Dimer: No results for input(s): "DDIMER" in the last 72 hours. Hemoglobin A1C: No results for input(s): "HGBA1C" in the last 72 hours. Fasting Lipid Panel: No results for input(s): "CHOL", "HDL", "LDLCALC", "TRIG", "CHOLHDL", "LDLDIRECT" in the last 72 hours. Thyroid Function Tests: No results for input(s): "TSH", "T4TOTAL", "T3FREE", "THYROIDAB" in the last 72 hours.  Invalid input(s): "FREET3" Anemia Panel: No results for input(s): "VITAMINB12", "FOLATE", "FERRITIN", "TIBC", "IRON", "RETICCTPCT" in the last 72 hours.   Radiology: DG Chest 2 View  Result Date: 08/03/2022 CLINICAL DATA:  Shortness of breath EXAM: CHEST - 2 VIEW COMPARISON:  07/10/2022,  chest CT 06/02/2022, 12/02/2021 FINDINGS: Post sternotomy changes and valve prosthesis. Left-sided pacing device. Mild diffuse reticular opacity with hazy ground-glass opacity at the bases likely related to chronic lung disease on prior CT. No confluent acute airspace disease. IMPRESSION: Mild diffuse reticular opacity with hazy ground-glass opacity at the bases likely related to chronic lung disease on prior CT. No definite acute superimposed airspace disease Electronically Signed   By: Donavan Foil M.D.   On: 08/03/2022 21:30   US Abdomen Limited RUQ (LIVER/GB)  Result Date: 07/10/2022 CLINICAL DATA:  Epigastric pain EXAM: ULTRASOUND ABDOMEN LIMITED RIGHT UPPER QUADRANT COMPARISON:  None Available. FINDINGS: Gallbladder: Numerous small gallstones visualized. No gallbladder wall thickening. No sonographic Murphy sign noted by sonographer. Common bile duct: Diameter: 3.8 mm Liver: No focal lesion identified. Normal parenchymal echogenicity. Portal vein is patent on color Doppler imaging with normal direction of blood flow towards the liver. Other: None. IMPRESSION: Cholelithiasis without sonographic evidence of acute cholecystitis. Electronically Signed   By: Yetta Glassman M.D.   On: 07/10/2022 18:01   DG Chest 2 View  Result Date: 07/10/2022 CLINICAL DATA:  Shortness of breath EXAM: CHEST - 2 VIEW COMPARISON:  Chest radiograph November 27, 2021 and CT June 02, 2022 FINDINGS: Left chest pacemaker with leads projecting over the right atrial appendage and right ventricle. Aortic valve prosthesis. Enlarged cardiac silhouette. Prior median sternotomy. Aortic atherosclerosis. Central vascular prominence. Bibasilar predominant interstitial opacities. No visible pleural effusion or pneumothorax. Right axillary surgical clips. Degenerative changes spine and bilateral shoulders. IMPRESSION: Enlarged cardiac silhouette with central vascular prominence and bibasilar predominant interstitial opacities superimposed  on chronic lung changes, possibly reflecting pulmonary edema. Electronically Signed   By: Dahlia Bailiff M.D.   On: 07/10/2022 17:33    ECHO 07/24/2022 2D DIMENSIONS  AORTA          Values     Normal RangeMAIN PA      Values     Normal Range       Annulus:  nm*  cm    [1.9 - 2.7]    PA Main:   2.1 cm    [1.5 - 2.1]     Aorta Sin:   2.2 cm    [2.4 - 3.6] RIGHT VENTRICLE   ST Junction:  nm*  cm    [2 - 3.2]      RV Base:   3.4 cm    [2.5 - 4.1]     Asc.Aorta:   2.9 cm    [1.9 - 3.5]     RV Mid:   2.4 cm    [1.9 - 3.5]  LEFT VENTRICLE                         RV Length:  nm*  cm    [  ]         LVIDd:   3.8 cm    [3.7 - 5.3] RIGHT ATRIUM         LVIDs:   2.3 cm    [2.2 - 3.4]    RA Area:  21   cm2   [ <= 20]        LVEDVi:  nm*  ml/m2 [29 - 61]         RAVi:  33   ml/m2 [15 - 27]        LVESVi:  nm*  ml/m2 [8 - 24]    INFERIOR VENA CAVA            FS:  39   %     [ >= 25]       Max.IVC:   1.7 cm    [ <= 2.1]           SWT:   1.2 cm    [0.6 - 0.9]    Min.IVC:  nm*  cm    [ <= 1.7]           PWT:   1.2 cm    [0.6 - 0.9] __________________  LEFT ATRIUM                           nm* - not measured       LA Diam:   4.5 cm    [2.7 - 3.8]       LA Area:  27   cm2   [ <= 20]     LA Volume:  74   ml    [22 - 52]          LAVi:  40   ml/m2 [16 - 34]   ECHOCARDIOGRAPHIC DESCRIPTIONS -----------------------------------------------  AORTIC ROOT          Size: Normal    Dissection: INDETERM FOR DISSECTION   AORTIC VALVE      Leaflets: BIOPROSTHETIC         Morphology: Normal      Mobility: Fully Mobile      AOV Note:  63mm   LEFT VENTRICLE                                      Anterior: Normal          Size: Normal  Lateral: Normal   Contraction: Normal                                  Septal: Normal    Closest EF: >55% (Estimated)                        Apical: Normal     LV masses: No Masses                             Inferior: Normal           LVH: MILD LVH  CONCENTRIC                  Posterior: Normal  Dias.FxClass: RELAXATION ABNORMALITY (GRADE 2) CORRESPONDS TO PSEUDONORMAL   MITRAL VALVE      Leaflets: Normal                  Mobility: Fully mobile    Morphology: Normal   LEFT ATRIUM          Size: MILDLY ENLARGED     LA masses: No masses                Normal IAS   MAIN PA          Size: Normal   PULMONIC VALVE    Morphology: Normal      Mobility: Fully Mobile       PV Note:   PAAT= 68ms   RIGHT VENTRICLE          Size: Normal                    Free wall: Normal   Contraction: Normal                    RV masses: No Masses         TAPSE:   1.0 cm,  Normal Range [>= 1.6 cm]       RV Note: TV S'= 0.30m/s   TRICUSPID VALVE      Leaflets: Normal                  Mobility: Fully mobile    Morphology: Normal   RIGHT ATRIUM          Size: Normal                     RA Other: None     RA masses: No masses   PERICARDIUM        Fluid: No effusion   INFERIOR VENACAVA          Size: Normal     Normal respiratory collapse   DOPPLER ECHO and OTHER SPECIAL PROCEDURES ------------------------------------     Aortic: TRIVIAL AR             BIOPROSTHETIC AoV      4.0 m/s peak vel   65 mmHg peak grad  41 mmHg mean grad 0.9 cm2 by DOPPLER   LVOT Diam: 1.9 cm. Resting LVOT Vel: 1.4 m/s. Dimensionless Index: 0.32      Mitral: MILD MR                No MS     MV Inflow E Vel.= 74.0 cm/s  MV Annulus E'Vel.= 4.0 cm/s  E/E'Ratio= 19  Tricuspid: TRIVIAL TR             No TS             2.7 m/s peak TR vel   29 mmHg peak RV pressure   Pulmonary: TRIVIAL PR             No PS       Other:   INTERPRETATION ---------------------------------------------------------------    NORMAL LEFT VENTRICULAR SYSTOLIC FUNCTION WITH MILD LVH    ELEVATED LA PRESSURES WITH DIASTOLIC DYSFUNCTION    NORMAL RIGHT VENTRICULAR SYSTOLIC FUNCTION    VALVULAR REGURGITATION: TRIVIAL AR, MILD MR, TRIVIAL PR, TRIVIAL TR    PROSTHETIC VALVE(S): BIOPROSTHETIC AoV     UNABLE TO OBTAIN IV ACCESS FOR USE OF DEFINITY     Compared with prior Echo study on 02/07/2020: SIGNIFICANT INCREASE IN AV    GRADIENTS, SMALLER LV CAVITY  AND HIGH DIMENSIONLESS INDEX SUGGESTS    UNDERFILLING OF THE LV MAY BE THE CAUSE OF THE HIGHER AOV GRADIENTS.   TELEMETRY reviewed by me (LT) 08/05/2022 : AV paced 74  EKG reviewed by me: AV paced rhythm 76  Data reviewed by me (LT) 08/05/2022: nursing notes, I/O cxr, CBC BMP BNP vitals telemetry  Principal Problem:   Heart failure due to valvular disease, acute on chronic, diastolic ([name]) Active Problems:   Essential hypertension   Paroxysmal atrial fibrillation ([name])   History of aortic valve replacement with bioprosthetic valve   Chronic obstructive pulmonary disease ([name])   GAD (generalized anxiety disorder)   Hypothyroidism   Class 2 obesity with alveolar hypoventilation, serious comorbidity, and body mass index (BMI) of 39.0 to 39.9 in adult [facility])   Depression   Hypokalemia    ASSESSMENT AND PLAN:  [name] is a 46yoF with a PMH of 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 Cardiology is consulted for assistance with her heart failure.   # Acute on chronic hypoxic respiratory failure  # suspicious lung mass  Reportedly desaturating with exertion to the 80s on her baseline of 4 L.  She had an abnormal PET/CT showing a RUL nodule that is pending biopsy followed by pulmonology (per patient, scheduled for 12/12)   # Acute on chronic HFpEF # Bicuspid AV s/p bioprosthetic AVR (2008) Reports shortness of breath that is multifactorial, with orthopnea that was refractory to increased dose of Bumex outpatient. Echo performed 11/17 at [place] showed preserved EF but increased AV  gradient (59mmHg) for which TAVR CT is planned. BNP elevated above her baseline at 256, compared to 120 when it was checked 3 weeks ago.  Reports early satiety and "bloating" that she feels is related to gallbladder issues and previous constipation.  She denies dietary indiscretions, but notes she ate some fried rice and pizza recently. -S/p IV Bumex 1 mg agree with continuing 2 mg IV twice daily for now. -Monitor and replete electrolytes -Continue GDMT with coreg 6.25mg  BID & Jardiance 10mg . Hold lisinopril 40 today with borderline hypotension. Consider addition of spiro if BP allows  -recommend continued follow up with Dr. Jenne Pane outpatient for further redo AVR eval  -discussed the importance of salt and fluid restriction -need to ambulate   # paroxysmal AF s/p AVN ablation & DC  PPM  AV paced rhythm on tele.  -continue coreg 6.25 BID  -continue eliquis 5mg  BID for stroke prevention  This patient's plan of care was discussed and created with Dr. Clayborn Bigness and he is in agreement.  Signed: Tristan Schroeder , PA-C 08/05/2022, 10:13 AM [facility]

## 2022-08-05 NOTE — ED Notes (Signed)
The pt was assisted with standing and pivoting to a recliner per pt request. The pt advised she was more comfortable in a recliner. Call bell and her table were left near the bedside.

## 2022-08-05 NOTE — ED Notes (Signed)
Pt eating from lunch tray, watching tv from chair.

## 2022-08-05 NOTE — Progress Notes (Addendum)
PROGRESS NOTE    [name]  UUV:[id number] DOB: November 25, 1945 DOA: 08/04/2022 PCP: [name], MD   Assessment & Plan:   Principal Problem:   Heart failure due to valvular disease, acute on chronic, diastolic ([name]) Active Problems:   Class 2 obesity with alveolar hypoventilation, serious comorbidity, and body mass index (BMI) of 39.0 to 39.9 in adult [facility])   Depression   Essential hypertension   Hypothyroidism   Paroxysmal atrial fibrillation ([name])   History of aortic valve replacement with bioprosthetic valve   Chronic obstructive pulmonary disease ([name])   GAD (generalized anxiety disorder)   Hypokalemia  Assessment and Plan: Acute on chronic diastolic CHF: s/p bumex x 1 and bumex management as per cardio. Monitor I/Os. Hx of bicuspid AV s/p bioprosthetic AVR. Worsening hypoxia on her baseline home oxygen supplementation at 4 L, orthopnea & increased abd girth. Has been seen by CT surgery. Continue on coreg, lisinopril   Obesity: BMI 37.1. Complicates overall care & prognosis    Depression: severity unknown. Continue on home dose of sertraline    HTN: continue on coreg, lisinopril    Hypothyroidism: continue on synthroid    Hypokalemia: WNL today    GAD: severity unknown. Continue on xanax prn    COPD: w/o exacerbation. Continue on bronchodilators & encourage incentive spirometry    History of aortic valve replacement: with bioprosthetic valve. Hx of aortic valve stenosis and is status post aortic valve replacement with bioprosthetic valve. Echocardiogram is suggestive of worsening valvular heart disease and patient has been seen by CT surgery. F/u outpatient w/ CT surg    PAF: continue on coreg, eliquis   Normocytic anemia: no need for a transfusion currently      DVT prophylaxis: eliquis Code Status: full  Family Communication:  Disposition Plan: likely d/c back home   Status is: Inpatient Remains inpatient appropriate because: severity of  illness    Level of care: Progressive Consultants:  Cardio   Procedures:  Antimicrobials:    Subjective: Pt c/o shortness of breath  Objective: Vitals:   08/04/22 2232 08/05/22 0133 08/05/22 0428 08/05/22 0800  BP: (!) 141/79 (!) 108/56 109/60 126/62  Pulse: 77 70 70 70  Resp: 18 18 20 20   Temp: 98.2 F (36.8 C)  97.6 F (36.4 C)   TempSrc: Oral  Oral   SpO2: 100% 98% 99% 100%  Weight:      Height:        Intake/Output Summary (Last 24 hours) at 08/05/2022 0907 Last data filed at 08/05/2022 0400 Gross per 24 hour  Intake --  Output 650 ml  Net -650 ml   Filed Weights   08/03/22 2044  Weight: 86.3 kg    Examination:  General exam: Appears calm and comfortable. Very hard of hearing  Respiratory system: diminished breath sounds b/l  Cardiovascular system: S1 & S2+. No rubs, gallops or clicks. Systolic murmur, 3/6 Gastrointestinal system: Abdomen is obese, soft and nontender. Normal bowel sounds heard. Central nervous system: Alert and oriented. Moves all extremities  Psychiatry: Judgement and insight appear normal. Mood & affect appropriate.     Data Reviewed: I have personally reviewed following labs and imaging studies  CBC: Recent Labs  Lab 08/03/22 2327 08/05/22 0532  WBC 6.1 5.0  NEUTROABS 3.9  --   HGB 10.8* 10.9*  HCT 34.2* 34.8*  MCV 88.4 87.9  PLT 234 474   Basic Metabolic Panel: Recent Labs  Lab 08/03/22 2327 08/05/22 0532  NA 137 138  K 3.3* 4.4  CL 102 107  CO2 27 23  GLUCOSE 118* 99  BUN 16 17  CREATININE 0.84 0.88  CALCIUM 8.7* 9.1   GFR: Estimated Creatinine Clearance: 54.7 mL/min (by C-G formula based on SCr of 0.88 mg/dL). Liver Function Tests: Recent Labs  Lab 08/03/22 2327  AST 18  ALT 15  ALKPHOS 64  BILITOT 0.7  PROT 6.7  ALBUMIN 3.6   No results for input(s): "LIPASE", "AMYLASE" in the last 168 hours. No results for input(s): "AMMONIA" in the last 168 hours. Coagulation Profile: No results for  input(s): "INR", "PROTIME" in the last 168 hours. Cardiac Enzymes: No results for input(s): "CKTOTAL", "CKMB", "CKMBINDEX", "TROPONINI" in the last 168 hours. BNP (last 3 results) No results for input(s): "PROBNP" in the last 8760 hours. HbA1C: No results for input(s): "HGBA1C" in the last 72 hours. CBG: No results for input(s): "GLUCAP" in the last 168 hours. Lipid Profile: No results for input(s): "CHOL", "HDL", "LDLCALC", "TRIG", "CHOLHDL", "LDLDIRECT" in the last 72 hours. Thyroid Function Tests: No results for input(s): "TSH", "T4TOTAL", "FREET4", "T3FREE", "THYROIDAB" in the last 72 hours. Anemia Panel: No results for input(s): "VITAMINB12", "FOLATE", "FERRITIN", "TIBC", "IRON", "RETICCTPCT" in the last 72 hours. Sepsis Labs: No results for input(s): "PROCALCITON", "LATICACIDVEN" in the last 168 hours.  No results found for this or any previous visit (from the past 240 hour(s)).       Radiology Studies: DG Chest 2 View  Result Date: 08/03/2022 CLINICAL DATA:  Shortness of breath EXAM: CHEST - 2 VIEW COMPARISON:  07/10/2022, chest CT 06/02/2022, 12/02/2021 FINDINGS: Post sternotomy changes and valve prosthesis. Left-sided pacing device. Mild diffuse reticular opacity with hazy ground-glass opacity at the bases likely related to chronic lung disease on prior CT. No confluent acute airspace disease. IMPRESSION: Mild diffuse reticular opacity with hazy ground-glass opacity at the bases likely related to chronic lung disease on prior CT. No definite acute superimposed airspace disease Electronically Signed   By: Donavan Foil M.D.   On: 08/03/2022 21:30        Scheduled Meds:  apixaban  5 mg Oral BID   atorvastatin  40 mg Oral QHS   bumetanide (BUMEX) IV  2 mg Intravenous BID   calcium-vitamin D  1 tablet Oral Daily   carvedilol  6.25 mg Oral BID WC   ferrous sulfate  325 mg Oral Q breakfast   levothyroxine  25 mcg Oral Q0600   lisinopril  40 mg Oral Daily    mometasone-formoterol  2 puff Inhalation BID   pantoprazole  40 mg Oral Daily   potassium chloride SA  40 mEq Oral BID   sertraline  100 mg Oral Daily   sodium chloride flush  3 mL Intravenous Q12H   traZODone  50 mg Oral QHS   umeclidinium bromide  1 puff Inhalation Daily   Continuous Infusions:  sodium chloride       LOS: 1 day    Time spent: 35 mins     Wyvonnia Dusky, MD [facility] Pager [phone]  If 7PM-7AM, please contact night-coverage [email or web address] 08/05/2022, 9:07 AM

## 2022-08-05 NOTE — ED Notes (Signed)
Informed rn bed assigned

## 2022-08-05 NOTE — ED Notes (Signed)
Pt assisted to bed side toilet and back into bed with monitor reinitiated.

## 2022-08-05 NOTE — ED Notes (Signed)
The pt was assisted with standing and being placed on the bedside commode. The pt was then assisted back to the bed without incident. The pt requires minimal assistance when ambulating but does require extra help when standing and sitting. The pt's food tray was placed in front of the pt. The pt complained her coffee was cold and that she would like another one. An order was put in with dietary and a warm coffee was brought up.

## 2022-08-05 NOTE — Discharge Instructions (Signed)

## 2022-08-06 ENCOUNTER — Other Ambulatory Visit: Payer: Self-pay

## 2022-08-06 DIAGNOSIS — I38 Endocarditis, valve unspecified: Secondary | ICD-10-CM | POA: Diagnosis not present

## 2022-08-06 DIAGNOSIS — I350 Nonrheumatic aortic (valve) stenosis: Secondary | ICD-10-CM | POA: Diagnosis not present

## 2022-08-06 DIAGNOSIS — E669 Obesity, unspecified: Secondary | ICD-10-CM | POA: Diagnosis not present

## 2022-08-06 DIAGNOSIS — I5033 Acute on chronic diastolic (congestive) heart failure: Secondary | ICD-10-CM | POA: Diagnosis not present

## 2022-08-06 LAB — CBC
HCT: 31.6 % — ABNORMAL LOW (ref 36.0–46.0)
Hemoglobin: 10.3 g/dL — ABNORMAL LOW (ref 12.0–15.0)
MCH: 28.8 pg (ref 26.0–34.0)
MCHC: 32.6 g/dL (ref 30.0–36.0)
MCV: 88.3 fL (ref 80.0–100.0)
Platelets: 212 10*3/uL (ref 150–400)
RBC: 3.58 MIL/uL — ABNORMAL LOW (ref 3.87–5.11)
RDW: 16.5 % — ABNORMAL HIGH (ref 11.5–15.5)
WBC: 4.6 10*3/uL (ref 4.0–10.5)
nRBC: 0 % (ref 0.0–0.2)

## 2022-08-06 LAB — MAGNESIUM: Magnesium: 2.2 mg/dL (ref 1.7–2.4)

## 2022-08-06 LAB — BASIC METABOLIC PANEL
Anion gap: 6 (ref 5–15)
BUN: 18 mg/dL (ref 8–23)
CO2: 26 mmol/L (ref 22–32)
Calcium: 8.8 mg/dL — ABNORMAL LOW (ref 8.9–10.3)
Chloride: 104 mmol/L (ref 98–111)
Creatinine, Ser: 0.96 mg/dL (ref 0.44–1.00)
GFR, Estimated: >60 mL/min (ref >60)
Glucose, Bld: 98 mg/dL (ref 70–99)
Potassium: 4.3 mmol/L (ref 3.5–5.1)
Sodium: 136 mmol/L (ref 135–145)

## 2022-08-06 MED ORDER — PANTOPRAZOLE SODIUM 40 MG PO TBEC
40.0000 mg | DELAYED_RELEASE_TABLET | Freq: Every day | ORAL | Status: DC
  Filled 2022-08-06: qty 1

## 2022-08-06 MED ORDER — NITROGLYCERIN 0.4 MG SL SUBL
0.4000 mg | SUBLINGUAL_TABLET | SUBLINGUAL | Status: DC | PRN
  Filled 2022-08-06: qty 1

## 2022-08-06 MED ORDER — ACETAMINOPHEN 325 MG PO TABS: 650.0000 mg | ORAL_TABLET | Freq: Four times a day (QID) | ORAL | Status: DC | PRN

## 2022-08-06 MED ORDER — PANTOPRAZOLE SODIUM 40 MG PO TBEC
40.0000 mg | DELAYED_RELEASE_TABLET | Freq: Every morning | ORAL | Status: DC
  Administered 2022-08-07: 40 mg via ORAL
  Filled 2022-08-06: qty 1

## 2022-08-06 MED ORDER — BUMETANIDE 1 MG PO TABS
2.0000 mg | ORAL_TABLET | Freq: Two times a day (BID) | ORAL | Status: DC
  Administered 2022-08-06 – 2022-08-07 (×3): 2 mg via ORAL
  Filled 2022-08-06 (×4): qty 2

## 2022-08-06 NOTE — Progress Notes (Signed)
[place] NOTE       Patient ID: [name] MRN: [id number] DOB/AGE: [birth year] 76 y.o.  Admit date: 08/04/2022 Referring Physician Dr. Francine Graven Primary Physician Dr. Clayborn Bigness  Primary Cardiologist Dr. 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 Cardiology is consulted for assistance with her heart failure.   Interval History: - very upset this morning re: having to share a room & tv, not knowing what medications she received this morning, time her medications were administered, etc.  - 1 hour following breakfast, she reported 10/10 substernal/epigastric pain that radiated to her back. Refused nitroglycerin. BP was 129/62.  Repeat EKG at that time redemonstrated her AV paced rhythm. Pain resolving with her usual opioid (chronic pain).  - after a long discussion, she ultimately is able to tell me that she ambulated to the restroom and felt less short of breath than she did before she came to the hospital.   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 s/p bioprosthetic valve replacement 2008 at [facility];  b. 01/2015 Echo: EF 60-65%, no rwma, Gr1 DD, mildly dil LA, nl RV fxn.   Aspiration pneumonia ([place])    Atrial fibrillation with RVR ([place]) 01/11/2015   Atrial flutter ([place])    a. 08/2016 s/p DCCV.  Remains on flecainide 50 mg bid.   Basal skull fracture ([name]) 20 yrs ago   CHF (congestive heart failure) ([name])    Chronic respiratory failure ([name])    COPD (chronic obstructive pulmonary disease) ([place])     a. 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 2008 prior to Aortic aneurysm repair-->nl cors.   History of stress test    a. 10/2015 MV: no ischemia/infarct.   HLD (hyperlipidemia)    HTN (hypertension)    Hypothyroidism    Obesity    PAF (paroxysmal atrial fibrillation) ([name])    a. on Eliquis; b. CHADS2VASc = 3 (HTN, age x 48, female).   Paroxysmal atrial fibrillation ([name]) 01/24/2015   Right upper quadrant abdominal tenderness without rebound tenderness 03/02/2018   S/P ascending aortic aneurysm repair 2008    Past Surgical History:  Procedure Laterality Date   ABDOMINAL AORTIC ANEURYSM REPAIR  2008   ABDOMINAL HYSTERECTOMY     AORTIC VALVE REPLACEMENT  2008   CARDIAC CATHETERIZATION     [facility]   CARDIOVERSION N/A 08/15/2018   Procedure: CARDIOVERSION;  Surgeon: [name], MD;  Location: [facility] ORS;  Service: Cardiovascular;  Laterality: N/A;   CARDIOVERSION N/A 11/14/2018   Procedure: CARDIOVERSION (CATH LAB);  Surgeon: [name], MD;  Location: [place] ORS;  Service: Cardiovascular;  Laterality: N/A;   CARDIOVERSION N/A 06/05/2019   Procedure: CARDIOVERSION;  Surgeon: [name], MD;  Location: [facility] ORS;  Service: Cardiovascular;  Laterality: N/A;   CARDIOVERSION N/A 08/14/2019   Procedure: CARDIOVERSION;  Surgeon: [name], MD;  Location: [facility] ORS;  Service: Cardiovascular;  Laterality: N/A;   CARDIOVERSION N/A 11/09/2019   Procedure: CARDIOVERSION;  Surgeon: Isaias Cowman, MD;  Location: [facility] ORS;  Service: Cardiovascular;  Laterality: N/A;   CARDIOVERSION N/A 01/17/2020   Procedure: CARDIOVERSION;  Surgeon: Isaias Cowman, MD;  Location: [facility] ORS;  Service: Cardiovascular;  Laterality: N/A;   CARPAL TUNNEL RELEASE     ELECTROPHYSIOLOGIC STUDY N/A 08/17/2016   Procedure: Cardioversion;  Surgeon: [name], MD;  Location: [facility]  ORS;  Service: Cardiovascular;  Laterality: N/A;   TUMOR EXCISION Left    x3 (arm)    Medications Prior to Admission  Medication Sig Dispense Refill Last Dose   ALPRAZolam (XANAX) 0.25 MG tablet TAKE 1 TABLET(0.25 MG) BY MOUTH TWICE DAILY AS NEEDED FOR ANXIETY (Patient taking differently: Take 0.25 mg by mouth 2 (two) times daily as needed for anxiety.) 60 tablet 1 08/03/2022 at 0800   apixaban (ELIQUIS) 5 MG TABS tablet Take 1 tablet (5 mg total) by mouth 2 (two) times daily. 180 tablet 2 08/03/2022 at 0800   Budeson-Glycopyrrol-Formoterol (BREZTRI AEROSPHERE) 160-9-4.8 MCG/ACT AERO Inhale 2 puffs into the lungs 2 (two) times daily. 10.7 g 11 08/03/2022 at 0800   bumetanide (BUMEX) 1 MG tablet Take 2 tablets (2 mg total) by mouth daily. And additional 2 tablets PM PRN 360 tablet 3 08/03/2022 at 0800   Calcium Carbonate-Vitamin D (CALCIUM 600+D PO) Take 1 tablet by mouth daily.   08/03/2022 at 0800   carvedilol (COREG) 6.25 MG tablet Take 1 tablet (6.25 mg total) by mouth 2 (two) times daily with a meal. 60 tablet 3 08/03/2022 at 0800   Coenzyme Q10 (COQ10) 200 MG CAPS Take 200 mg by mouth daily.   08/03/2022 at 0800   diclofenac Sodium (VOLTAREN) 1 % GEL SMARTSIG:Gram(s) Topical Twice Daily   08/03/2022 at 0800   empagliflozin (JARDIANCE) 10 MG TABS tablet Take 1 tablet (10 mg total) by mouth daily before breakfast. 30 tablet 5 08/03/2022 at 0700   ferrous sulfate 325 (65 FE) MG tablet Take 1 tablet (325 mg total) by mouth daily with breakfast.  3 08/03/2022 at 0800   levothyroxine (SYNTHROID) 25 MCG tablet TAKE 1 TABLET BY MOUTH DAILY BEFORE BREAKFAST 90 tablet 1 08/03/2022 at 0700   lisinopril (ZESTRIL) 40 MG tablet TAKE 1 TABLET(40 MG) BY MOUTH DAILY(DOSE INCREASE) 90 tablet 3 08/02/2022 at 2000   loratadine (CLARITIN) 10 MG tablet Take 10 mg by mouth daily.   08/03/2022 at 0800   oxyCODONE-acetaminophen (PERCOCET) 7.5-325 MG tablet Take one tab po bid for pain severe (Patient taking  differently: Take 1 tablet by mouth every 12 (twelve) hours as needed for moderate pain.) 60 tablet 0 08/03/2022 at 0800   pantoprazole (PROTONIX) 40 MG tablet TAKE 1 TABLET BY MOUTH EVERY DAY 90 tablet 1 08/03/2022 at 0800   potassium chloride (KLOR-CON) 10 MEQ tablet Take 4 tablets (40 mEq total) by mouth 2 (two) times daily. 720 tablet 3 08/03/2022   sertraline (ZOLOFT) 100 MG tablet Take one tab a day for Anxiety (Patient taking differently: Take 100 mg by mouth daily. Take one tab a day for Anxiety) 90 tablet 2 08/03/2022 at 0800   amLODipine (NORVASC) 10 MG tablet Take 10 mg by mouth daily. (Patient not taking: Reported on 08/04/2022)   Not Taking   atorvastatin (LIPITOR) 40 MG tablet TAKE 1 TABLET BY MOUTH EVERY NIGHT AT BEDTIME 90 tablet 1 08/02/2022 at 2000   ipratropium-albuterol (  DUONEB) 0.5-2.5 (3) MG/3ML SOLN Inhale 3 mLs into the lungs every 4 (four) hours as needed. Inhale 3 mls into the lungs every 4 to 6 hours and as needed 360 mL 3 prn   ondansetron (ZOFRAN-ODT) 4 MG disintegrating tablet Take 1 tablet (4 mg total) by mouth every 8 (eight) hours as needed for nausea or vomiting. 20 tablet 0 prn   predniSONE (DELTASONE) 10 MG tablet Take one tab 3 x day for 3 days, then take one tab 2 x a day for 3 days and then take one tab a day for 3 days for copd (Patient not taking: Reported on 08/04/2022) 18 tablet 0 Not Taking   sodium chloride (OCEAN) 0.65 % SOLN nasal spray Place 1 spray into both nostrils as needed for congestion.   prn   traZODone (DESYREL) 50 MG tablet Take 1 tablet (50 mg total) by mouth at bedtime. 90 tablet 1 08/02/2022 at 2000   VENTOLIN HFA 108 (90 Base) MCG/ACT inhaler INHALE 2 PUFFS INTO THE LUNGS EVERY 6 HOURS AS NEEDED FOR WHEEZING OR SHORTNESS OF BREATH 18 g 3 prn    Social History   Socioeconomic History   Marital status: Single    Spouse name: Not on file   Number of children: Not on file   Years of education: Not on file   Highest education level: Not on  file  Occupational History   Not on file  Tobacco Use   Smoking status: Former    Types: Cigarettes   Smokeless tobacco: Never  Vaping Use   Vaping Use: Never used  Substance and Sexual Activity   Alcohol use: No   Drug use: No   Sexual activity: Never    Birth control/protection: Surgical  Other Topics Concern   Not on file  Social History Narrative   Not on file   Social Determinants of Health   Financial Resource Strain: Low Risk  (08/04/2021)   Overall Financial Resource Strain (CARDIA)    Difficulty of Paying Living Expenses: Not very hard  Food Insecurity: No Food Insecurity (08/05/2022)   Hunger Vital Sign    Worried About Running Out of Food in the Last Year: Never true    [place] in the Last Year: Never true  Transportation Needs: No Transportation Needs (08/05/2022)   PRAPARE - Hydrologist (Medical): No    Lack of Transportation (Non-Medical): No  Physical Activity: Not on file  Stress: Not on file  Social Connections: Not on file  Intimate Partner Violence: Not At Risk (08/05/2022)   Humiliation, Afraid, Rape, and Kick questionnaire    Fear of Current or Ex-Partner: No    Emotionally Abused: No    Physically Abused: No    Sexually Abused: No    Family History  Problem Relation Age of Onset   Stroke Father    Stroke Paternal Grandmother      Vitals:   08/06/22 0419 08/06/22 0437 08/06/22 0911 08/06/22 1028  BP: (!) 148/72  (!) 160/76 129/62  Pulse: 73  72 70  Resp: 20  16   Temp: 97.9 F (36.6 C)  (!) 97.5 F (36.4 C)   TempSrc: Oral     SpO2: 92%  100% 99%  Weight:  88 kg    Height:        PHYSICAL EXAM General: Elderly chronically ill-appearing Caucasian female, well nourished, in no acute distress.  Sitting upright in recliner. HEENT:  Normocephalic and atraumatic.  Hard of hearing in the left ear, reads lips well. Neck:  No JVD.  Lungs: Normal respiratory effort on 4 L by [place].  Decreased breath sounds  with trace crackles left base. Heart: HRRR . Normal S1 and S2, 3/6 systolic murmur best heard at the RUSB. Lower sternum tender to palpation. Abdomen: tenderness to palpation in the epigastrium & RUQ without rebound or guarding. Excess adiposity.  Msk: Normal strength and tone for age. Extremities: Warm and well perfused. No clubbing, cyanosis.  No peripheral edema.  Neuro: Alert and oriented X 3. Psych:  frustrated mood, perseverates on events that have upset her.   Labs: Basic Metabolic Panel: Recent Labs    08/05/22 0532 08/06/22 0404  NA 138 136  K 4.4 4.3  CL 107 104  CO2 23 26  GLUCOSE 99 98  BUN 17 18  CREATININE 0.88 0.96  CALCIUM 9.1 8.8*  MG  --  2.2    Liver Function Tests: Recent Labs    08/03/22 2327  AST 18  ALT 15  ALKPHOS 64  BILITOT 0.7  PROT 6.7  ALBUMIN 3.6    No results for input(s): "LIPASE", "AMYLASE" in the last 72 hours. CBC: Recent Labs    08/03/22 2327 08/05/22 0532 08/06/22 0404  WBC 6.1 5.0 4.6  NEUTROABS 3.9  --   --   HGB 10.8* 10.9* 10.3*  HCT 34.2* 34.8* 31.6*  MCV 88.4 87.9 88.3  PLT 234 221 212    Cardiac Enzymes: Recent Labs    08/03/22 2327 08/04/22 0238  TROPONINIHS 15 15    BNP: Recent Labs    08/03/22 2327  BNP 256.9*    D-Dimer: No results for input(s): "DDIMER" in the last 72 hours. Hemoglobin A1C: No results for input(s): "HGBA1C" in the last 72 hours. Fasting Lipid Panel: No results for input(s): "CHOL", "HDL", "LDLCALC", "TRIG", "CHOLHDL", "LDLDIRECT" in the last 72 hours. Thyroid Function Tests: No results for input(s): "TSH", "T4TOTAL", "T3FREE", "THYROIDAB" in the last 72 hours.  Invalid input(s): "FREET3" Anemia Panel: No results for input(s): "VITAMINB12", "FOLATE", "FERRITIN", "TIBC", "IRON", "RETICCTPCT" in the last 72 hours.   Radiology: DG Chest 2 View  Result Date: 08/03/2022 CLINICAL DATA:  Shortness of breath EXAM: CHEST - 2 VIEW COMPARISON:  07/10/2022, chest CT 06/02/2022,  12/02/2021 FINDINGS: Post sternotomy changes and valve prosthesis. Left-sided pacing device. Mild diffuse reticular opacity with hazy ground-glass opacity at the bases likely related to chronic lung disease on prior CT. No confluent acute airspace disease. IMPRESSION: Mild diffuse reticular opacity with hazy ground-glass opacity at the bases likely related to chronic lung disease on prior CT. No definite acute superimposed airspace disease Electronically Signed   By: Donavan Foil M.D.   On: 08/03/2022 21:30   US Abdomen Limited RUQ (LIVER/GB)  Result Date: 07/10/2022 CLINICAL DATA:  Epigastric pain EXAM: ULTRASOUND ABDOMEN LIMITED RIGHT UPPER QUADRANT COMPARISON:  None Available. FINDINGS: Gallbladder: Numerous small gallstones visualized. No gallbladder wall thickening. No sonographic Murphy sign noted by sonographer. Common bile duct: Diameter: 3.8 mm Liver: No focal lesion identified. Normal parenchymal echogenicity. Portal vein is patent on color Doppler imaging with normal direction of blood flow towards the liver. Other: None. IMPRESSION: Cholelithiasis without sonographic evidence of acute cholecystitis. Electronically Signed   By: Yetta Glassman M.D.   On: 07/10/2022 18:01   DG Chest 2 View  Result Date: 07/10/2022 CLINICAL DATA:  Shortness of breath EXAM: CHEST - 2 VIEW COMPARISON:  Chest radiograph November 27, 2021 and CT June 02, 2022 FINDINGS: Left chest pacemaker with leads projecting over the right atrial appendage and right ventricle. Aortic valve prosthesis. Enlarged cardiac silhouette. Prior median sternotomy. Aortic atherosclerosis. Central vascular prominence. Bibasilar predominant interstitial opacities. No visible pleural effusion or pneumothorax. Right axillary surgical clips. Degenerative changes spine and bilateral shoulders. IMPRESSION: Enlarged cardiac silhouette with central vascular prominence and bibasilar predominant interstitial opacities superimposed on chronic lung  changes, possibly reflecting pulmonary edema. Electronically Signed   By: Dahlia Bailiff M.D.   On: 07/10/2022 17:33    ECHO 07/24/2022 2D DIMENSIONS  AORTA          Values     Normal RangeMAIN PA      Values     Normal Range       Annulus:  nm*  cm    [1.9 - 2.7]    PA Main:   2.1 cm    [1.5 - 2.1]     Aorta Sin:   2.2 cm    [2.4 - 3.6] RIGHT VENTRICLE   ST Junction:  nm*  cm    [2 - 3.2]      RV Base:   3.4 cm    [2.5 - 4.1]     Asc.Aorta:   2.9 cm    [1.9 - 3.5]     RV Mid:   2.4 cm    [1.9 - 3.5]  LEFT VENTRICLE                         RV Length:  nm*  cm    [  ]         LVIDd:   3.8 cm    [3.7 - 5.3] RIGHT ATRIUM         LVIDs:   2.3 cm    [2.2 - 3.4]    RA Area:  21   cm2   [ <= 20]        LVEDVi:  nm*  ml/m2 [29 - 61]         RAVi:  33   ml/m2 [15 - 27]        LVESVi:  nm*  ml/m2 [8 - 24]    INFERIOR VENA CAVA            FS:  39   %     [ >= 25]       Max.IVC:   1.7 cm    [ <= 2.1]           SWT:   1.2 cm    [0.6 - 0.9]    Min.IVC:  nm*  cm    [ <= 1.7]           PWT:   1.2 cm    [0.6 - 0.9] __________________  LEFT ATRIUM                           nm* - not measured       LA Diam:   4.5 cm    [2.7 - 3.8]       LA Area:  27   cm2   [ <= 20]     LA Volume:  74   ml    [22 - 52]          LAVi:  40   ml/m2 [16 - 34]   ECHOCARDIOGRAPHIC DESCRIPTIONS -----------------------------------------------  AORTIC ROOT  Size: Normal    Dissection: INDETERM FOR DISSECTION   AORTIC VALVE      Leaflets: BIOPROSTHETIC         Morphology: Normal      Mobility: Fully Mobile      AOV Note:  54mm   LEFT VENTRICLE                                      Anterior: Normal          Size: Normal                                 Lateral: Normal   Contraction: Normal                                  Septal: Normal    Closest EF: >55% (Estimated)                        Apical: Normal     LV masses: No Masses                             Inferior: Normal           LVH: MILD LVH CONCENTRIC                   Posterior: Normal  Dias.FxClass: RELAXATION ABNORMALITY (GRADE 2) CORRESPONDS TO PSEUDONORMAL   MITRAL VALVE      Leaflets: Normal                  Mobility: Fully mobile    Morphology: Normal   LEFT ATRIUM          Size: MILDLY ENLARGED     LA masses: No masses                Normal IAS   MAIN PA          Size: Normal   PULMONIC VALVE    Morphology: Normal      Mobility: Fully Mobile       PV Note:   PAAT= 88ms   RIGHT VENTRICLE          Size: Normal                    Free wall: Normal   Contraction: Normal                    RV masses: No Masses         TAPSE:   1.0 cm,  Normal Range [>= 1.6 cm]       RV Note: TV S'= 0.64m/s   TRICUSPID VALVE      Leaflets: Normal                  Mobility: Fully mobile    Morphology: Normal   RIGHT ATRIUM          Size: Normal                     RA Other: None     RA masses: No masses   PERICARDIUM        Fluid: No effusion   INFERIOR  VENACAVA          Size: Normal     Normal respiratory collapse   DOPPLER ECHO and OTHER SPECIAL PROCEDURES ------------------------------------     Aortic: TRIVIAL AR             BIOPROSTHETIC AoV      4.0 m/s peak vel   65 mmHg peak grad  41 mmHg mean grad 0.9 cm2 by DOPPLER   LVOT Diam: 1.9 cm. Resting LVOT Vel: 1.4 m/s. Dimensionless Index: 0.32      Mitral: MILD MR                No MS     MV Inflow E Vel.= 74.0 cm/s  MV Annulus E'Vel.= 4.0 cm/s  E/E'Ratio= 19   Tricuspid: TRIVIAL TR             No TS             2.7 m/s peak TR vel   29 mmHg peak RV pressure   Pulmonary: TRIVIAL PR             No PS       Other:   INTERPRETATION ---------------------------------------------------------------    NORMAL LEFT VENTRICULAR SYSTOLIC FUNCTION WITH MILD LVH    ELEVATED LA PRESSURES WITH DIASTOLIC DYSFUNCTION    NORMAL RIGHT VENTRICULAR SYSTOLIC FUNCTION    VALVULAR REGURGITATION: TRIVIAL AR, MILD MR, TRIVIAL PR, TRIVIAL TR    PROSTHETIC VALVE(S): BIOPROSTHETIC AoV    UNABLE TO OBTAIN  IV ACCESS FOR USE OF DEFINITY     Compared with prior Echo study on 02/07/2020: SIGNIFICANT INCREASE IN AV    GRADIENTS, SMALLER LV CAVITY  AND HIGH DIMENSIONLESS INDEX SUGGESTS    UNDERFILLING OF THE LV MAY BE THE CAUSE OF THE HIGHER AOV GRADIENTS.   TELEMETRY reviewed by me (LT) 08/06/2022 : AV paced 74  EKG reviewed by me: AV paced rhythm 70, similar to prior from 11/27  Data reviewed by me (LT) 08/06/2022: hospitalist progress ntoe, nursing notes, I/O , CBC BMP BNP vitals telemetry ekgs  Principal Problem:   Heart failure due to valvular disease, acute on chronic, diastolic ([name]) Active Problems:   Essential hypertension   Paroxysmal atrial fibrillation ([name])   History of aortic valve replacement with bioprosthetic valve   Chronic obstructive pulmonary disease ([name])   GAD (generalized anxiety disorder)   Hypothyroidism   Class 2 obesity with alveolar hypoventilation, serious comorbidity, and body mass index (BMI) of 39.0 to 39.9 in adult [facility])   Depression   Hypokalemia    ASSESSMENT AND PLAN:  [name] is a 29yoF with a PMH of 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 Cardiology is consulted for assistance with her heart failure.   # Acute on chronic hypoxic respiratory failure  # suspicious lung mass  Reportedly desaturating with exertion to the 80s on her baseline of 4 L.  She had an abnormal PET/CT showing a RUL nodule that is pending biopsy followed by pulmonology (per patient, scheduled for 12/12)   # Acute on chronic HFpEF # Bicuspid AV s/p bioprosthetic AVR (2008) Reports shortness of breath that is multifactorial, with orthopnea that was refractory to increased dose of Bumex outpatient. Echo performed 11/17 at [place]  showed preserved EF but increased AV gradient (41mmHg) for which TAVR CT is planned. BNP elevated above her baseline at 256, compared to 120 when it was checked 3 weeks ago.  Reports early satiety and "bloating" that she feels is related to gallbladder issues and previous constipation.  Suspect dietary indiscretions playing a role. Ultimately feeling less short of breath with ambulation on hospital day 2.  -S/p IV Bumex.  -restart home bumex 2mg  PO BID.  -Continue GDMT with coreg 6.25mg  BID, lisinopril 40mg . Restart Jardiance 10mg  at discharge. Consider spiro on an outpatient basis. -recommend continued follow up with Dr. Jenne Pane outpatient for further redo AVR eval  -discussed the importance of salt and fluid restriction -strongly encouraged ambulation. If she continues to feel better, without desaturating on her baseline O2, she is ok for discharge today from a cardiac standpoint.   # paroxysmal AF s/p AVN ablation & DC PPM  AV paced rhythm on tele.  -continue coreg 6.25 BID  -continue eliquis 5mg  BID for stroke prevention  # atypical chest pain # GERD Atypical pain without EKG changes or change in rhythm/rate on tele. Occurred 1 hour following breakfast. Relieved with percocet.  Usually takes protonix prior to breakfast (she was given this medication after breakfast today).   This patient's plan of care was discussed and created with Dr. Clayborn Bigness and he is in agreement.  Signed: Tristan Schroeder , PA-C 08/06/2022, 11:53 AM [facility]

## 2022-08-06 NOTE — Progress Notes (Signed)
Mobility Specialist - Progress Note   08/06/22 0909  Mobility  Activity Ambulated independently in room;Transferred to/from [facility]  Level of Assistance Independent  Assistive Device None  Distance Ambulated (ft) 10 ft  Activity Response Tolerated well  Mobility Referral Yes  $Mobility charge 1 Mobility    Pt OOB on 4L upon arrival. Pt ambulates to [facility] indep. Pt left on [facility] with education on how to call for assistance if needed  [facility]  Mobility Specialist  08/06/22 9:12 AM

## 2022-08-06 NOTE — TOC Initial Note (Signed)
Transition of Care [facility]) - Initial/Assessment Note    Patient Details  Name: [name] MRN: [id number] Date of Birth: [birth year]  Transition of Care [facility]) CM/SW Contact:    Quin Hoop, LCSW Phone Number: 08/06/2022, 10:10 AM  Clinical Narrative:                 CSW phoned pt and was unable to reach her.  Pt on droplet precautions so assessment completed via phone with pt's son, Eddie Dibbles [phone]).    Eddie Dibbles not sure where pt gets medications from, but he does states that he has no concerns with getting her medications.  Pt's PCP is Dr. Humphrey Rolls.  Pt is transported to her appointments by her sister, her son or her dtr-in-law.  Eddie Dibbles states that pt has the following DME at home:  RW an power lift chair.  CSW will continue to follow for TOC needs.  Expected Discharge Plan:  (unknown at this time) Barriers to Discharge: Continued Medical Work up   Patient Goals and CMS Choice Patient states their goals for this hospitalization and ongoing recovery are:: to return home      Expected Discharge Plan and Services Expected Discharge Plan:  (unknown at this time)       Living arrangements for the past 2 months: Single Family Home                                      Prior Living Arrangements/Services Living arrangements for the past 2 months: Single Family Home Lives with:: Self                   Activities of Daily Living Home Assistive Devices/Equipment: Eyeglasses, Oxygen ADL Screening (condition at time of admission) Patient's cognitive ability adequate to safely complete daily activities?: Yes Is the patient deaf or have difficulty hearing?: No Does the patient have difficulty seeing, even when wearing glasses/contacts?: No Does the patient have difficulty concentrating, remembering, or making decisions?: No Patient able to express need for assistance with ADLs?: Yes Does the patient have difficulty dressing or bathing?: No Independently performs ADLs?:  Yes (appropriate for developmental age) Does the patient have difficulty walking or climbing stairs?: No Weakness of Legs: None Weakness of Arms/Hands: None  Permission Sought/Granted                  Emotional Assessment Appearance::  (assessment completed by phone.  pt on droplet precautions.  completed assessment with pt son, Eddie Dibbles)       Alcohol / Substance Use: Not Applicable    Admission diagnosis:  CHF (congestive heart failure) ([place]) [I50.9] Acute on chronic congestive heart failure, unspecified heart failure type [facility]) [I50.9] Patient Active Problem List   Diagnosis Date Noted   CHF (congestive heart failure) ([place]) 08/04/2022   Hypokalemia 08/04/2022   Chronic upper back pain 04/14/2022   Chronic myofascial pain 04/14/2022   Musculoskeletal disorder involving upper trapezius muscle 04/14/2022   Abnormal CT scan, cervical spine (03/04/2022) 04/01/2022   Trigger point of shoulder region (Bilateral) 04/01/2022   Trigger point with neck pain 04/01/2022   DISH (diffuse idiopathic skeletal hyperostosis) 03/12/2022   Atrial fibrillation, chronic ([place]) 01/26/2022   Morbid obesity ([place]) 01/26/2022   Chronic respiratory failure with hypoxia ([place]) 01/26/2022   Bilateral pneumonia 01/25/2022   Chronic intractable headache 54/56/2563   Acute metabolic encephalopathy 89/37/3428   Chronic diastolic CHF (congestive heart  failure) ([place]) 12/02/2021   HLD (hyperlipidemia) 12/02/2021   Stroke ([place]) 12/02/2021   Iron deficiency anemia 12/02/2021   Depression 12/02/2021   Acute on chronic respiratory failure with hypoxia ([place]) 11/11/2021   Unable to maintain body in lying position 10/16/2021   Orthopnea 10/16/2021   Class 2 obesity with alveolar hypoventilation, serious comorbidity, and body mass index (BMI) of 39.0 to 39.9 in adult ([place]) 10/16/2021   At high risk for postoperative complications 51/70/0174   Hearing loss 08/12/2021   Long term prescription benzodiazepine use  04/30/2021   Chronic shoulder pain (1ry area of Pain) (Bilateral) (L>R) 04/30/2021   Chronic upper extremity pain (2ry area of Pain) (Bilateral) (L>R) 04/30/2021   Cervicalgia 04/30/2021   Chronic neck pain (3ry area of Pain) (Posterior) (Bilateral) (L>R) 04/30/2021   Shoulder blade pain (4th area of Pain) (Left) 04/30/2021   Osteoarthritis of glenohumeral joint (Left) 04/30/2021   Osteoarthritis of AC (acromioclavicular) joint (Left) 04/30/2021   Osteoarthritis of glenohumeral joints (Bilateral) 04/30/2021   Osteoarthritis of acromioclavicular joints (Bilateral) 04/30/2021   Primary osteoarthritis of shoulders (Bilateral) 04/30/2021   DDD (degenerative disc disease), cervical 04/30/2021   Cervical radiculitis (Left) 04/30/2021   Cervical radiculopathy (Left) 04/30/2021   C6 radiculopathy (Left) 04/30/2021   C7 radiculopathy (Left) 04/30/2021   Wheelchair dependence 04/30/2021   Chronic pain syndrome 04/29/2021   Pharmacologic therapy 04/29/2021   Disorder of skeletal system 04/29/2021   Problems influencing health status 04/29/2021   Chronic anticoagulation ([place]) 03/14/2021   Hx of atrioventricular node ablation 03/14/2021   Symptomatic anemia 01/29/2021   Heart failure due to valvular disease, acute on chronic, diastolic ([place]) 94/49/6759   Chronic obstructive pulmonary disease (COPD) ([place])    Hypothyroidism    Cardiac pacemaker in situ 05/10/2020   Acute non-recurrent frontal sinusitis 04/22/2020   Vertigo 04/22/2020   S/P placement of cardiac pacemaker 02/21/2020   Atrial fibrillation status post cardioversion ([place]) 11/16/2019   Episode of moderate major depression ([place]) 10/10/2019   Persistent atrial fibrillation ([place])    Encounter for general adult medical examination with abnormal findings 07/10/2019   Encounter for screening mammogram for malignant neoplasm of breast 07/10/2019   Atopic dermatitis 05/02/2019   Paroxysmal atrial flutter ([place]) 08/11/2018   Encounter for  long-term (current) use of medications 07/09/2018   Chronic left shoulder pain 07/09/2018   Conjunctivitis 07/09/2018   Oxygen dependent 07/09/2018   GAD (generalized anxiety disorder) 07/09/2018   Ovarian failure 07/09/2018   Positive colorectal cancer screening using Cologuard test 04/24/2018   Calculus of gallbladder with acute on chronic cholecystitis 04/08/2018   H/O: CVA (cerebrovascular accident) 04/08/2018   Dysuria 03/02/2018   Right upper quadrant abdominal tenderness without rebound tenderness 03/02/2018   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 exacerbation 03/02/2018   Chronic obstructive pulmonary disease ([place]) 09/19/2017   Typical atrial flutter ([name])    Irritable bowel syndrome without diarrhea 01/29/2015   Essential hypertension 01/24/2015   Paroxysmal atrial fibrillation ([place]) 01/24/2015   History of aortic valve replacement with bioprosthetic valve 01/24/2015   Atrial fibrillation with RVR ([place]) 01/11/2015   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 05/18/2014   Shoulder impingement syndrome (Left) 05/18/2014   Cellulitis and abscess 02/13/2014   MRSA (methicillin resistant Staphylococcus aureus) 02/13/2014   Aneurysm, ascending aorta ([place]) 06/23/2013   Bicuspid aortic valve 06/23/2013   PCP:  [name], MD Pharmacy:   [facility], [address]  [place] [address] Phone: [phone] Fax: [phone]  [place], [place] [address] [place] [address] Phone: [phone] Fax: [phone]  [place], [address] [place] [place] [address] Phone: [phone] Fax: [phone]     Social Determinants of Health (SDOH) Interventions    Readmission Risk Interventions     No data to display

## 2022-08-06 NOTE — Progress Notes (Signed)
PROGRESS NOTE    [name]  LZJ:[id number] DOB: 16-Jan-1946 DOA: 08/04/2022 PCP: [name], MD   Assessment & Plan:   Principal Problem:   Heart failure due to valvular disease, acute on chronic, diastolic ([name]) Active Problems:   Class 2 obesity with alveolar hypoventilation, serious comorbidity, and body mass index (BMI) of 39.0 to 39.9 in adult [facility])   Depression   Essential hypertension   Hypothyroidism   Paroxysmal atrial fibrillation ([name])   History of aortic valve replacement with bioprosthetic valve   Chronic obstructive pulmonary disease ([name])   GAD (generalized anxiety disorder)   Hypokalemia  Assessment and Plan: Acute on chronic diastolic CHF: continue on po bumex as per cardio. Monitor I/Os. Hx of bicuspid AV s/p bioprosthetic AVR & has been seen by CT surg. Worsening hypoxia on her baseline home oxygen supplementation at 4 L, orthopnea & increased abd girth. Continue on coreg, lisinopril   Obesity: BMI 37.1. Complicates overall care & prognosis   Depression: severity unknown. Continue on home dose of sertraline    HTN: continue on lisinopril, coreg    Hypothyroidism: continue on levothyroxine    Hypokalemia: WNL today     GAD: severity unknown. Xanax prn    COPD: w/o exacerbation. Continue on bronchodilators & encourage incentive spirometry    History of aortic valve replacement: with bioprosthetic valve. Hx of aortic valve stenosis and is status post aortic valve replacement with bioprosthetic valve. Echocardiogram is suggestive of worsening valvular heart disease and patient has been seen by CT surgery. F/u outpatient w/ CT surg    PAF: continue on eliquis, coreg   Normocytic anemia: H&H are stable. No need for a transfusion currently      DVT prophylaxis: eliquis Code Status: full  Family Communication:  Disposition Plan: likely d/c back home   Status is: Inpatient Remains inpatient appropriate because: severity of illness    Level  of care: Progressive Consultants:  Cardio   Procedures:  Antimicrobials:    Subjective: Pt c/o chest pain   Objective: Vitals:   08/05/22 1955 08/06/22 0026 08/06/22 0419 08/06/22 0437  BP: 126/62 (!) 111/48 (!) 148/72   Pulse: 70 68 73   Resp: 20 18 20    Temp: 97.9 F (36.6 C) 98 F (36.7 C) 97.9 F (36.6 C)   TempSrc: Oral Oral Oral   SpO2: 97% 98% 92%   Weight:    88 kg  Height:        Intake/Output Summary (Last 24 hours) at 08/06/2022 0803 Last data filed at 08/05/2022 1955 Gross per 24 hour  Intake --  Output 0 ml  Net 0 ml   Filed Weights   08/03/22 2044 08/06/22 0437  Weight: 86.3 kg 88 kg    Examination:  General exam: Appears agitated and frustrated Respiratory system: decreased breath sounds b/l  Cardiovascular system: S1/S2+. No rubs or clicks. Systolic murmur, 3/6 Gastrointestinal system: Abd is soft, NT, obese & normal bowel sounds  Central nervous system: alert and oriented. Moves all extremities  Psychiatry: Judgement and insight appears at baseline. Agitated & frustrated mood and affect     Data Reviewed: I have personally reviewed following labs and imaging studies  CBC: Recent Labs  Lab 08/03/22 2327 08/05/22 0532 08/06/22 0404  WBC 6.1 5.0 4.6  NEUTROABS 3.9  --   --   HGB 10.8* 10.9* 10.3*  HCT 34.2* 34.8* 31.6*  MCV 88.4 87.9 88.3  PLT 234 221 024   Basic Metabolic Panel: Recent  Labs  Lab 08/03/22 2327 08/05/22 0532 08/06/22 0404  NA 137 138 136  K 3.3* 4.4 4.3  CL 102 107 104  CO2 27 23 26   GLUCOSE 118* 99 98  BUN 16 17 18   CREATININE 0.84 0.88 0.96  CALCIUM 8.7* 9.1 8.8*  MG  --   --  2.2   GFR: Estimated Creatinine Clearance: 50.7 mL/min (by C-G formula based on SCr of 0.96 mg/dL). Liver Function Tests: Recent Labs  Lab 08/03/22 2327  AST 18  ALT 15  ALKPHOS 64  BILITOT 0.7  PROT 6.7  ALBUMIN 3.6   No results for input(s): "LIPASE", "AMYLASE" in the last 168 hours. No results for input(s):  "AMMONIA" in the last 168 hours. Coagulation Profile: No results for input(s): "INR", "PROTIME" in the last 168 hours. Cardiac Enzymes: No results for input(s): "CKTOTAL", "CKMB", "CKMBINDEX", "TROPONINI" in the last 168 hours. BNP (last 3 results) No results for input(s): "PROBNP" in the last 8760 hours. HbA1C: No results for input(s): "HGBA1C" in the last 72 hours. CBG: No results for input(s): "GLUCAP" in the last 168 hours. Lipid Profile: No results for input(s): "CHOL", "HDL", "LDLCALC", "TRIG", "CHOLHDL", "LDLDIRECT" in the last 72 hours. Thyroid Function Tests: No results for input(s): "TSH", "T4TOTAL", "FREET4", "T3FREE", "THYROIDAB" in the last 72 hours. Anemia Panel: No results for input(s): "VITAMINB12", "FOLATE", "FERRITIN", "TIBC", "IRON", "RETICCTPCT" in the last 72 hours. Sepsis Labs: No results for input(s): "PROCALCITON", "LATICACIDVEN" in the last 168 hours.  No results found for this or any previous visit (from the past 240 hour(s)).       Radiology Studies: No results found.      Scheduled Meds:  apixaban  5 mg Oral BID   atorvastatin  40 mg Oral QHS   calcium-vitamin D  1 tablet Oral Daily   carvedilol  6.25 mg Oral BID WC   docusate sodium  200 mg Oral BID   ferrous sulfate  325 mg Oral Q breakfast   levothyroxine  25 mcg Oral Q0600   lisinopril  40 mg Oral Daily   mometasone-formoterol  2 puff Inhalation BID   pantoprazole  40 mg Oral Daily   potassium chloride SA  40 mEq Oral BID   sertraline  100 mg Oral Daily   sodium chloride flush  3 mL Intravenous Q12H   traZODone  50 mg Oral QHS   umeclidinium bromide  1 puff Inhalation Daily   Continuous Infusions:  sodium chloride       LOS: 2 days    Time spent: 37 mins     Wyvonnia Dusky, MD [facility] Pager [phone]  If 7PM-7AM, please contact night-coverage [email or web address] 08/06/2022, 8:03 AM

## 2022-08-06 NOTE — Progress Notes (Signed)
This RN responded to alert that telemetry had been removed. The patient stated "it was heavy". This RN contacted MD and telemetry order was discontinued at this time.

## 2022-08-06 NOTE — Progress Notes (Signed)
Mobility Specialist - Progress Note   08/06/22 0912  Mobility  Activity Ambulated independently in room;Transferred to/from [facility]  Level of Assistance Independent  Assistive Device None  Distance Ambulated (ft) 30 ft  Activity Response Tolerated well  Mobility Referral Yes  $Mobility charge 1 Mobility   Pt OOB on BSC on 4L upon arrival. Pt STS and ambulates within room indep. Pt left in recliner with needs in reach and RN entering room.   Gretchen Short  Mobility Specialist  08/06/22 9:13 AM

## 2022-08-07 DIAGNOSIS — I38 Endocarditis, valve unspecified: Secondary | ICD-10-CM | POA: Diagnosis not present

## 2022-08-07 DIAGNOSIS — I350 Nonrheumatic aortic (valve) stenosis: Secondary | ICD-10-CM | POA: Diagnosis not present

## 2022-08-07 DIAGNOSIS — I5033 Acute on chronic diastolic (congestive) heart failure: Secondary | ICD-10-CM | POA: Diagnosis not present

## 2022-08-07 DIAGNOSIS — I480 Paroxysmal atrial fibrillation: Secondary | ICD-10-CM | POA: Diagnosis not present

## 2022-08-07 LAB — CBC
HCT: 33 % — ABNORMAL LOW (ref 36.0–46.0)
Hemoglobin: 10.3 g/dL — ABNORMAL LOW (ref 12.0–15.0)
MCH: 27.9 pg (ref 26.0–34.0)
MCHC: 31.2 g/dL (ref 30.0–36.0)
MCV: 89.4 fL (ref 80.0–100.0)
Platelets: 211 10*3/uL (ref 150–400)
RBC: 3.69 MIL/uL — ABNORMAL LOW (ref 3.87–5.11)
RDW: 16.5 % — ABNORMAL HIGH (ref 11.5–15.5)
WBC: 4.5 10*3/uL (ref 4.0–10.5)
nRBC: 0 % (ref 0.0–0.2)

## 2022-08-07 LAB — BASIC METABOLIC PANEL
Anion gap: 8 (ref 5–15)
BUN: 20 mg/dL (ref 8–23)
CO2: 29 mmol/L (ref 22–32)
Calcium: 9 mg/dL (ref 8.9–10.3)
Chloride: 102 mmol/L (ref 98–111)
Creatinine, Ser: 0.86 mg/dL (ref 0.44–1.00)
GFR, Estimated: >60 mL/min (ref >60)
Glucose, Bld: 100 mg/dL — ABNORMAL HIGH (ref 70–99)
Potassium: 3.5 mmol/L (ref 3.5–5.1)
Sodium: 139 mmol/L (ref 135–145)

## 2022-08-07 LAB — MAGNESIUM: Magnesium: 2.2 mg/dL (ref 1.7–2.4)

## 2022-08-07 MED ORDER — DICLOFENAC SODIUM 1 % EX GEL
2.0000 g | Freq: Four times a day (QID) | CUTANEOUS | Status: DC
  Filled 2022-08-07: qty 100

## 2022-08-07 MED ORDER — POTASSIUM CHLORIDE CRYS ER 20 MEQ PO TBCR
40.0000 meq | EXTENDED_RELEASE_TABLET | Freq: Two times a day (BID) | ORAL | Status: DC
  Administered 2022-08-07: 40 meq via ORAL
  Filled 2022-08-07: qty 2

## 2022-08-07 MED ORDER — BUMETANIDE 1 MG PO TABS: 2.0000 mg | ORAL_TABLET | Freq: Two times a day (BID) | ORAL | 3 refills | Status: DC

## 2022-08-07 NOTE — Discharge Summary (Signed)
Physician Discharge Summary  [name] ZHY:[id number] DOB: 1945/09/19 DOA: 08/04/2022  PCP: Andrea Guise, MD  Admit date: 08/04/2022 Discharge date: 08/07/2022  Admitted From: home  Disposition:  home   Recommendations for Outpatient Follow-up:  Follow up with PCP in 1-2 weeks F/u w/ cardio, Dr. Saralyn Pilar, in 1-2 weeks Keep previously scheduled appts for pulmon & heart failure clinic   Home Health: no  Equipment/Devices:  Discharge Condition: stable  CODE STATUS: full  Diet recommendation: Heart Healthy   Brief/Interim Summary: HPI was taken from Dr. Francine Graven: [name] is a 76 y.o. female with medical history significant aortic stenosis status post bioprosthetic valve replacement in 2008, history of A-fib on chronic anticoagulation therapy, history of chronic diastolic dysfunction CHF, COPD with chronic respiratory failure on 4 L of oxygen continuous, hypothyroidism, obesity who presents to the emergency room for evaluation of worsening shortness of breath with exertion from her baseline associated with increased abdominal girth and orthopnea for about 4 days. She states that she has to stop and catch her breath with minimal form of exertion and that she checks her pulse oximetry even on her 4 L of oxygen she has pulse oximetry in the 80s.  On the night prior to her admission she was unable to lay flat due to shortness of breath.  She has been compliant with her diuretics and denies having any dietary indiscretion.  She thinks she may have gained weight but is unable to tell me how much.  She denies any leg swelling, fever, no chills, no cough, no headache, no urinary symptoms, no changes in her bowel habits, no headache, no blurred vision, no focal deficit. Labs show a BNP of 256, potassium of 3.3 Chest x-ray showsmild diffuse reticular opacity with hazy ground-glass opacity at the bases likely related to chronic lung disease on prior CT Twelve-lead EKG shows paced  rhythm   As per Dr. Jimmye Norman 11/29-12/1/23: Cardio was consulted for her CHF symptoms. Pt was initially given IV bumex. Pt responded well to IV diuretics and the following day, the bumex was transitioned back to po bumex as per cardio. Pt was ambulating independently so therapy was not indicated. For more information, please see previous progress/consult notes.   Discharge Diagnoses:  Principal Problem:   Heart failure due to valvular disease, acute on chronic, diastolic ([name]) Active Problems:   Class 2 obesity with alveolar hypoventilation, serious comorbidity, and body mass index (BMI) of 39.0 to 39.9 in adult [facility])   Depression   Essential hypertension   Hypothyroidism   Paroxysmal atrial fibrillation ([name])   History of aortic valve replacement with bioprosthetic valve   Chronic obstructive pulmonary disease ([name])   GAD (generalized anxiety disorder)   Hypokalemia  Acute on chronic diastolic CHF: continue on po bumex as per cardio. Monitor I/Os. Hx of bicuspid AV s/p bioprosthetic AVR & has been seen by CT surg. Worsening hypoxia on her baseline home oxygen supplementation at 4 L, orthopnea & increased abd girth. Continue on coreg, lisinopril   Obesity: BMI 37.1. Complicates overall care & prognosis   Depression: severity unknown. Continue on home dose of sertraline    HTN: continue on lisinopril, coreg    Hypothyroidism: continue on levothyroxine    Hypokalemia: WNL today     GAD: severity unknown. Xanax prn    COPD: w/o exacerbation. Continue on bronchodilators & encourage incentive spirometry    History of aortic valve replacement: with bioprosthetic valve. Hx of aortic valve stenosis and is status  post aortic valve replacement with bioprosthetic valve. Echocardiogram is suggestive of worsening valvular heart disease and patient has been seen by CT surgery. F/u outpatient w/ CT surg    PAF: continue on eliquis, coreg   Normocytic anemia: H&H are stable. No need for a  transfusion currently   Discharge Instructions  Discharge Instructions     Diet - low sodium heart healthy   Complete by: As directed    Discharge instructions   Complete by: As directed    Keep your previously scheduled appointments w/ your pulmonologist, CT surgeon, heart failure clinic. F/u w/ cardio, Dr. Saralyn Pilar, in 1-2 weeks   Increase activity slowly   Complete by: As directed       Allergies as of 08/07/2022       Reactions   Benadryl [diphenhydramine Hcl (sleep)] Palpitations   Cetirizine Palpitations   Lasix [furosemide] Rash   Levaquin [levofloxacin In D5w] Other (See Comments)   Reaction:  Fatigue and muscle soreness   Meloxicam Rash   Soy Allergy Hives, Nausea And Vomiting   Sulfa Antibiotics Rash        Medication List     STOP taking these medications    amLODipine 10 MG tablet Commonly known as: NORVASC   predniSONE 10 MG tablet Commonly known as: DELTASONE       TAKE these medications    ALPRAZolam 0.25 MG tablet Commonly known as: XANAX TAKE 1 TABLET(0.25 MG) BY MOUTH TWICE DAILY AS NEEDED FOR ANXIETY What changed: See the new instructions.   apixaban 5 MG Tabs tablet Commonly known as: Eliquis Take 1 tablet (5 mg total) by mouth 2 (two) times daily.   atorvastatin 40 MG tablet Commonly known as: LIPITOR TAKE 1 TABLET BY MOUTH EVERY NIGHT AT BEDTIME   Breztri Aerosphere 160-9-4.8 MCG/ACT Aero Generic drug: Budeson-Glycopyrrol-Formoterol Inhale 2 puffs into the lungs 2 (two) times daily.   bumetanide 1 MG tablet Commonly known as: BUMEX Take 2 tablets (2 mg total) by mouth 2 (two) times daily. And additional 2 tablets PM PRN What changed: when to take this   CALCIUM 600+D PO Take 1 tablet by mouth daily.   carvedilol 6.25 MG tablet Commonly known as: COREG Take 1 tablet (6.25 mg total) by mouth 2 (two) times daily with a meal.   CoQ10 200 MG Caps Take 200 mg by mouth daily.   diclofenac Sodium 1 % Gel Commonly known as:  VOLTAREN SMARTSIG:Gram(s) Topical Twice Daily   empagliflozin 10 MG Tabs tablet Commonly known as: Jardiance Take 1 tablet (10 mg total) by mouth daily before breakfast.   ferrous sulfate 325 (65 FE) MG tablet Take 1 tablet (325 mg total) by mouth daily with breakfast.   ipratropium-albuterol 0.5-2.5 (3) MG/3ML Soln Commonly known as: DUONEB Inhale 3 mLs into the lungs every 4 (four) hours as needed. Inhale 3 mls into the lungs every 4 to 6 hours and as needed   levothyroxine 25 MCG tablet Commonly known as: SYNTHROID TAKE 1 TABLET BY MOUTH DAILY BEFORE BREAKFAST   lisinopril 40 MG tablet Commonly known as: ZESTRIL TAKE 1 TABLET(40 MG) BY MOUTH DAILY(DOSE INCREASE)   loratadine 10 MG tablet Commonly known as: CLARITIN Take 10 mg by mouth daily.   ondansetron 4 MG disintegrating tablet Commonly known as: ZOFRAN-ODT Take 1 tablet (4 mg total) by mouth every 8 (eight) hours as needed for nausea or vomiting.   oxyCODONE-acetaminophen 7.5-325 MG tablet Commonly known as: Percocet Take one tab po bid for pain severe  What changed:  how much to take how to take this when to take this reasons to take this additional instructions   pantoprazole 40 MG tablet Commonly known as: PROTONIX TAKE 1 TABLET BY MOUTH EVERY DAY   potassium chloride 10 MEQ tablet Commonly known as: KLOR-CON Take 4 tablets (40 mEq total) by mouth 2 (two) times daily.   sertraline 100 MG tablet Commonly known as: ZOLOFT Take one tab a day for Anxiety What changed:  how much to take how to take this when to take this   sodium chloride 0.65 % Soln nasal spray Commonly known as: OCEAN Place 1 spray into both nostrils as needed for congestion.   traZODone 50 MG tablet Commonly known as: DESYREL Take 1 tablet (50 mg total) by mouth at bedtime.   Ventolin HFA 108 (90 Base) MCG/ACT inhaler Generic drug: albuterol INHALE 2 PUFFS INTO THE LUNGS EVERY 6 HOURS AS NEEDED FOR WHEEZING OR SHORTNESS OF  BREATH        Follow-up Information     Isaias Cowman, MD Follow up.   Specialty: Cardiology Why: F/u in 1-2 weeks Contact information: [facility] [place] [address] [phone]         Andrea Guise, MD Follow up.   Specialties: Internal Medicine, Cardiology Why: F/u in 1-2 weeks Contact information: [place] [address] [phone]                Allergies  Allergen Reactions   Benadryl [Diphenhydramine Hcl (Sleep)] Palpitations   Cetirizine Palpitations   Lasix [Furosemide] Rash   Levaquin [Levofloxacin In D5w] Other (See Comments)    Reaction:  Fatigue and muscle soreness   Meloxicam Rash   Soy Allergy Hives and Nausea And Vomiting   Sulfa Antibiotics Rash    Consultations: Cardio    Procedures/Studies: DG Chest 2 View  Result Date: 08/03/2022 CLINICAL DATA:  Shortness of breath EXAM: CHEST - 2 VIEW COMPARISON:  07/10/2022, chest CT 06/02/2022, 12/02/2021 FINDINGS: Post sternotomy changes and valve prosthesis. Left-sided pacing device. Mild diffuse reticular opacity with hazy ground-glass opacity at the bases likely related to chronic lung disease on prior CT. No confluent acute airspace disease. IMPRESSION: Mild diffuse reticular opacity with hazy ground-glass opacity at the bases likely related to chronic lung disease on prior CT. No definite acute superimposed airspace disease Electronically Signed   By: Donavan Foil M.D.   On: 08/03/2022 21:30   US Abdomen Limited RUQ (LIVER/GB)  Result Date: 07/10/2022 CLINICAL DATA:  Epigastric pain EXAM: ULTRASOUND ABDOMEN LIMITED RIGHT UPPER QUADRANT COMPARISON:  None Available. FINDINGS: Gallbladder: Numerous small gallstones visualized. No gallbladder wall thickening. No sonographic Murphy sign noted by sonographer. Common bile duct: Diameter: 3.8 mm Liver: No focal lesion identified. Normal parenchymal echogenicity. Portal vein is patent on  color Doppler imaging with normal direction of blood flow towards the liver. Other: None. IMPRESSION: Cholelithiasis without sonographic evidence of acute cholecystitis. Electronically Signed   By: Yetta Glassman M.D.   On: 07/10/2022 18:01   DG Chest 2 View  Result Date: 07/10/2022 CLINICAL DATA:  Shortness of breath EXAM: CHEST - 2 VIEW COMPARISON:  Chest radiograph November 27, 2021 and CT June 02, 2022 FINDINGS: Left chest pacemaker with leads projecting over the right atrial appendage and right ventricle. Aortic valve prosthesis. Enlarged cardiac silhouette. Prior median sternotomy. Aortic atherosclerosis. Central vascular prominence. Bibasilar predominant interstitial opacities. No visible pleural effusion or pneumothorax. Right axillary surgical clips. Degenerative changes  spine and bilateral shoulders. IMPRESSION: Enlarged cardiac silhouette with central vascular prominence and bibasilar predominant interstitial opacities superimposed on chronic lung changes, possibly reflecting pulmonary edema. Electronically Signed   By: Dahlia Bailiff M.D.   On: 07/10/2022 17:33   (Echo, Carotid, EGD, Colonoscopy, ERCP)    Subjective: Pt c/o fatigue   Discharge Exam: Vitals:   08/07/22 0451 08/07/22 0800  BP: (!) 126/56 (!) 152/73  Pulse: 70 70  Resp: 18 17  Temp: 97.8 F (36.6 C) 98 F (36.7 C)  SpO2: 99% 97%   Vitals:   08/06/22 2300 08/07/22 0224 08/07/22 0451 08/07/22 0800  BP: (!) 122/52  (!) 126/56 (!) 152/73  Pulse: 70  70 70  Resp: 20  18 17   Temp: (!) 97.1 F (36.2 C)  97.8 F (36.6 C) 98 F (36.7 C)  TempSrc:      SpO2: 99%  99% 97%  Weight:  85.3 kg    Height:        General: Pt is alert, awake, not in acute distress Cardiovascular: S1/S2 +, no rubs, no gallops Respiratory: decreased breath sounds b/l otherwise clear  Abdominal: Soft, NT, obese, bowel sounds + Extremities: no cyanosis    The results of significant diagnostics from this hospitalization (including  imaging, microbiology, ancillary and laboratory) are listed below for reference.     Microbiology: No results found for this or any previous visit (from the past 240 hour(s)).   Labs: BNP (last 3 results) Recent Labs    01/25/22 1558 07/10/22 1640 08/03/22 2327  BNP 127.6* 120.8* 683.4*   Basic Metabolic Panel: Recent Labs  Lab 08/03/22 2327 08/05/22 0532 08/06/22 0404 08/07/22 0443  NA 137 138 136 139  K 3.3* 4.4 4.3 3.5  CL 102 107 104 102  CO2 27 23 26 29   GLUCOSE 118* 99 98 100*  BUN 16 17 18 20   CREATININE 0.84 0.88 0.96 0.86  CALCIUM 8.7* 9.1 8.8* 9.0  MG  --   --  2.2 2.2   Liver Function Tests: Recent Labs  Lab 08/03/22 2327  AST 18  ALT 15  ALKPHOS 64  BILITOT 0.7  PROT 6.7  ALBUMIN 3.6   No results for input(s): "LIPASE", "AMYLASE" in the last 168 hours. No results for input(s): "AMMONIA" in the last 168 hours. CBC: Recent Labs  Lab 08/03/22 2327 08/05/22 0532 08/06/22 0404 08/07/22 0443  WBC 6.1 5.0 4.6 4.5  NEUTROABS 3.9  --   --   --   HGB 10.8* 10.9* 10.3* 10.3*  HCT 34.2* 34.8* 31.6* 33.0*  MCV 88.4 87.9 88.3 89.4  PLT 234 221 212 211   Cardiac Enzymes: No results for input(s): "CKTOTAL", "CKMB", "CKMBINDEX", "TROPONINI" in the last 168 hours. BNP: Invalid input(s): "POCBNP" CBG: No results for input(s): "GLUCAP" in the last 168 hours. D-Dimer No results for input(s): "DDIMER" in the last 72 hours. Hgb A1c No results for input(s): "HGBA1C" in the last 72 hours. Lipid Profile No results for input(s): "CHOL", "HDL", "LDLCALC", "TRIG", "CHOLHDL", "LDLDIRECT" in the last 72 hours. Thyroid function studies No results for input(s): "TSH", "T4TOTAL", "T3FREE", "THYROIDAB" in the last 72 hours.  Invalid input(s): "FREET3" Anemia work up No results for input(s): "VITAMINB12", "FOLATE", "FERRITIN", "TIBC", "IRON", "RETICCTPCT" in the last 72 hours. Urinalysis    Component Value Date/Time   COLORURINE YELLOW (A) 01/26/2022 0814    APPEARANCEUR CLEAR (A) 01/26/2022 0814   APPEARANCEUR Clear 07/15/2021 1606   LABSPEC 1.011 01/26/2022 0814   LABSPEC 1.009  10/16/2011 0022   PHURINE 6.0 01/26/2022 0814   GLUCOSEU NEGATIVE 01/26/2022 0814   GLUCOSEU Negative 10/16/2011 0022   HGBUR NEGATIVE 01/26/2022 0814   BILIRUBINUR Negative 04/30/2022 1617   BILIRUBINUR Negative 07/15/2021 1606   BILIRUBINUR Negative 10/16/2011 0022   KETONESUR NEGATIVE 01/26/2022 0814   PROTEINUR Negative 04/30/2022 [place] 01/26/2022 0814   UROBILINOGEN 0.2 04/30/2022 1617   NITRITE Negative 04/30/2022 1617   NITRITE NEGATIVE 01/26/2022 0814   LEUKOCYTESUR Negative 04/30/2022 [place] 01/26/2022 0814   LEUKOCYTESUR Trace 10/16/2011 0022   Sepsis Labs Recent Labs  Lab 08/03/22 2327 08/05/22 0532 08/06/22 0404 08/07/22 0443  WBC 6.1 5.0 4.6 4.5   Microbiology No results found for this or any previous visit (from the past 240 hour(s)).   Time coordinating discharge: Over 30 minutes  SIGNED:   Wyvonnia Dusky, MD  [facility] 08/07/2022, 11:59 AM Pager   If 7PM-7AM, please contact night-coverage [email or web address]

## 2022-08-07 NOTE — Care Management Important Message (Signed)
Important Message  Patient Details  Name: [name] MRN: [id number] Date of Birth: [birth year]   Medicare Important Message Given:  Yes  Reviewed Medicare IM with patient via room phone due to isolation status.  Aware of Medicare right to appeal.  Declined receiving copy for reference at this time.   Dannette Barbara 08/07/2022, 12:41 PM

## 2022-08-07 NOTE — Progress Notes (Signed)
[place] NOTE       Patient ID: [name] MRN: [id number] DOB/AGE: [birth year] 76 y.o.  Admit date: 08/04/2022 Referring Physician Dr. Francine Graven Primary Physician Dr. Clayborn Bigness  Primary Cardiologist Dr. 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 Cardiology is consulted for assistance with her heart failure.   Interval History: - feel tired today, frustrated with woken up several times earlier this morning for labs to be drawn, vitals  -No further chest discomfort, she feels her shortness of breath is at her baseline.  Refused walking to the nurses station and around the floor yesterday, citing "this would be too far."  She denies dyspnea while ambulating around her breathing, from the recliner in the corner of the room to the restroom and back. -Eager to go home.  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 s/p bioprosthetic valve replacement 2008 at [facility];  b. 01/2015 Echo: EF 60-65%, no rwma, Gr1 DD, mildly dil LA, nl RV fxn.   Aspiration pneumonia ([place])    Atrial fibrillation with RVR ([place]) 01/11/2015   Atrial flutter ([place])    a. 08/2016 s/p DCCV.  Remains on flecainide 50 mg bid.   Basal skull fracture ([name]) 20 yrs ago   CHF (congestive heart failure) ([name])    Chronic respiratory failure ([name])    COPD (chronic obstructive pulmonary disease) ([place])    a. 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 2008 prior to Aortic aneurysm repair-->nl cors.   History of stress test    a. 10/2015 MV: no ischemia/infarct.   HLD (hyperlipidemia)    HTN (hypertension)    Hypothyroidism    Obesity    PAF (paroxysmal atrial fibrillation) ([name])    a. on Eliquis; b. CHADS2VASc = 3 (HTN, age x 76, female).   Paroxysmal atrial fibrillation ([name]) 01/24/2015   Right upper quadrant abdominal tenderness without rebound tenderness 03/02/2018   S/P ascending aortic aneurysm repair 2008    Past Surgical History:  Procedure Laterality Date   ABDOMINAL AORTIC ANEURYSM REPAIR  2008   ABDOMINAL HYSTERECTOMY     AORTIC VALVE REPLACEMENT  2008   CARDIAC CATHETERIZATION     [facility]   CARDIOVERSION N/A 08/15/2018   Procedure: CARDIOVERSION;  Surgeon: [name], MD;  Location: [facility] ORS;  Service: Cardiovascular;  Laterality: N/A;   CARDIOVERSION N/A 11/14/2018   Procedure: CARDIOVERSION (CATH LAB);  Surgeon: [name], MD;  Location: [place] ORS;  Service: Cardiovascular;  Laterality: N/A;   CARDIOVERSION N/A 06/05/2019   Procedure: CARDIOVERSION;  Surgeon: [name], MD;  Location: [place] ORS;  Service: Cardiovascular;  Laterality: N/A;   CARDIOVERSION N/A 08/14/2019   Procedure: CARDIOVERSION;  Surgeon: [name], MD;  Location: [facility] ORS;  Service: Cardiovascular;  Laterality: N/A;   CARDIOVERSION N/A 11/09/2019  Procedure: CARDIOVERSION;  Surgeon: Isaias Cowman, MD;  Location: [facility] ORS;  Service: Cardiovascular;  Laterality: N/A;   CARDIOVERSION N/A 01/17/2020   Procedure: CARDIOVERSION;  Surgeon: Isaias Cowman, MD;  Location: [facility] ORS;  Service: Cardiovascular;  Laterality: N/A;   CARPAL TUNNEL RELEASE     ELECTROPHYSIOLOGIC STUDY N/A 08/17/2016   Procedure: Cardioversion;  Surgeon: [name], MD;  Location: [facility] ORS;  Service: Cardiovascular;  Laterality: N/A;   TUMOR EXCISION Left    x3 (arm)    Medications Prior to Admission  Medication Sig  Dispense Refill Last Dose   ALPRAZolam (XANAX) 0.25 MG tablet TAKE 1 TABLET(0.25 MG) BY MOUTH TWICE DAILY AS NEEDED FOR ANXIETY (Patient taking differently: Take 0.25 mg by mouth 2 (two) times daily as needed for anxiety.) 60 tablet 1 08/03/2022 at 0800   apixaban (ELIQUIS) 5 MG TABS tablet Take 1 tablet (5 mg total) by mouth 2 (two) times daily. 180 tablet 2 08/03/2022 at 0800   Budeson-Glycopyrrol-Formoterol (BREZTRI AEROSPHERE) 160-9-4.8 MCG/ACT AERO Inhale 2 puffs into the lungs 2 (two) times daily. 10.7 g 11 08/03/2022 at 0800   bumetanide (BUMEX) 1 MG tablet Take 2 tablets (2 mg total) by mouth daily. And additional 2 tablets PM PRN 360 tablet 3 08/03/2022 at 0800   Calcium Carbonate-Vitamin D (CALCIUM 600+D PO) Take 1 tablet by mouth daily.   08/03/2022 at 0800   carvedilol (COREG) 6.25 MG tablet Take 1 tablet (6.25 mg total) by mouth 2 (two) times daily with a meal. 60 tablet 3 08/03/2022 at 0800   Coenzyme Q10 (COQ10) 200 MG CAPS Take 200 mg by mouth daily.   08/03/2022 at 0800   diclofenac Sodium (VOLTAREN) 1 % GEL SMARTSIG:Gram(s) Topical Twice Daily   08/03/2022 at 0800   empagliflozin (JARDIANCE) 10 MG TABS tablet Take 1 tablet (10 mg total) by mouth daily before breakfast. 30 tablet 5 08/03/2022 at 0700   ferrous sulfate 325 (65 FE) MG tablet Take 1 tablet (325 mg total) by mouth daily with breakfast.  3 08/03/2022 at 0800   levothyroxine (SYNTHROID) 25 MCG tablet TAKE 1 TABLET BY MOUTH DAILY BEFORE BREAKFAST 90 tablet 1 08/03/2022 at 0700   lisinopril (ZESTRIL) 40 MG tablet TAKE 1 TABLET(40 MG) BY MOUTH DAILY(DOSE INCREASE) 90 tablet 3 08/02/2022 at 2000   loratadine (CLARITIN) 10 MG tablet Take 10 mg by mouth daily.   08/03/2022 at 0800   oxyCODONE-acetaminophen (PERCOCET) 7.5-325 MG tablet Take one tab po bid for pain severe (Patient taking differently: Take 1 tablet by mouth every 12 (twelve) hours as needed for moderate pain.) 60 tablet 0 08/03/2022 at 0800   pantoprazole (PROTONIX)  40 MG tablet TAKE 1 TABLET BY MOUTH EVERY DAY 90 tablet 1 08/03/2022 at 0800   potassium chloride (KLOR-CON) 10 MEQ tablet Take 4 tablets (40 mEq total) by mouth 2 (two) times daily. 720 tablet 3 08/03/2022   sertraline (ZOLOFT) 100 MG tablet Take one tab a day for Anxiety (Patient taking differently: Take 100 mg by mouth daily. Take one tab a day for Anxiety) 90 tablet 2 08/03/2022 at 0800   amLODipine (NORVASC) 10 MG tablet Take 10 mg by mouth daily. (Patient not taking: Reported on 08/04/2022)   Not Taking   atorvastatin (LIPITOR) 40 MG tablet TAKE 1 TABLET BY MOUTH EVERY NIGHT AT BEDTIME 90 tablet 1 08/02/2022 at 2000   ipratropium-albuterol (DUONEB) 0.5-2.5 (3) MG/3ML SOLN Inhale 3 mLs into the lungs every 4 (four) hours as needed. Inhale 3 mls into the  lungs every 4 to 6 hours and as needed 360 mL 3 prn   ondansetron (ZOFRAN-ODT) 4 MG disintegrating tablet Take 1 tablet (4 mg total) by mouth every 8 (eight) hours as needed for nausea or vomiting. 20 tablet 0 prn   predniSONE (DELTASONE) 10 MG tablet Take one tab 3 x day for 3 days, then take one tab 2 x a day for 3 days and then take one tab a day for 3 days for copd (Patient not taking: Reported on 08/04/2022) 18 tablet 0 Not Taking   sodium chloride (OCEAN) 0.65 % SOLN nasal spray Place 1 spray into both nostrils as needed for congestion.   prn   traZODone (DESYREL) 50 MG tablet Take 1 tablet (50 mg total) by mouth at bedtime. 90 tablet 1 08/02/2022 at 2000   VENTOLIN HFA 108 (90 Base) MCG/ACT inhaler INHALE 2 PUFFS INTO THE LUNGS EVERY 6 HOURS AS NEEDED FOR WHEEZING OR SHORTNESS OF BREATH 18 g 3 prn    Social History   Socioeconomic History   Marital status: Single    Spouse name: Not on file   Number of children: Not on file   Years of education: Not on file   Highest education level: Not on file  Occupational History   Not on file  Tobacco Use   Smoking status: Former    Types: Cigarettes   Smokeless tobacco: Never  Vaping Use    Vaping Use: Never used  Substance and Sexual Activity   Alcohol use: No   Drug use: No   Sexual activity: Never    Birth control/protection: Surgical  Other Topics Concern   Not on file  Social History Narrative   Not on file   Social Determinants of Health   Financial Resource Strain: Low Risk  (08/04/2021)   Overall Financial Resource Strain (CARDIA)    Difficulty of Paying Living Expenses: Not very hard  Food Insecurity: No Food Insecurity (08/05/2022)   Hunger Vital Sign    Worried About Running Out of Food in the Last Year: Never true    [place] in the Last Year: Never true  Transportation Needs: No Transportation Needs (08/05/2022)   PRAPARE - Hydrologist (Medical): No    Lack of Transportation (Non-Medical): No  Physical Activity: Not on file  Stress: Not on file  Social Connections: Not on file  Intimate Partner Violence: Not At Risk (08/05/2022)   Humiliation, Afraid, Rape, and Kick questionnaire    Fear of Current or Ex-Partner: No    Emotionally Abused: No    Physically Abused: No    Sexually Abused: No    Family History  Problem Relation Age of Onset   Stroke Father    Stroke Paternal Grandmother      Vitals:   08/06/22 2300 08/07/22 0224 08/07/22 0451 08/07/22 0800  BP: (!) 122/52  (!) 126/56 (!) 152/73  Pulse: 70  70 70  Resp: 20  18 17   Temp: (!) 97.1 F (36.2 C)  97.8 F (36.6 C) 98 F (36.7 C)  TempSrc:      SpO2: 99%  99% 97%  Weight:  85.3 kg    Height:        PHYSICAL EXAM General: Elderly chronically ill-appearing Caucasian female, well nourished, in no acute distress.  Sitting upright in recliner. HEENT:  Normocephalic and atraumatic.  Hard of hearing in the left ear, reads lips well. Neck:  No JVD.  Lungs: Normal  respiratory effort on 4 L by [place].  Decreased breath sounds with trace crackles left base. Heart: HRRR . Normal S1 and S2, 3/6 systolic murmur best heard at the RUSB. Abdomen: Soft,  nontender to palpation. Msk: Normal strength and tone for age. Extremities: Warm and well perfused. No clubbing, cyanosis.  No peripheral edema.  Neuro: Alert and oriented X 3. Psych:  frustrated mood.   Labs: Basic Metabolic Panel: Recent Labs    08/06/22 0404 08/07/22 0443  NA 136 139  K 4.3 3.5  CL 104 102  CO2 26 29  GLUCOSE 98 100*  BUN 18 20  CREATININE 0.96 0.86  CALCIUM 8.8* 9.0  MG 2.2 2.2    Liver Function Tests: No results for input(s): "AST", "ALT", "ALKPHOS", "BILITOT", "PROT", "ALBUMIN" in the last 72 hours.  No results for input(s): "LIPASE", "AMYLASE" in the last 72 hours. CBC: Recent Labs    08/06/22 0404 08/07/22 0443  WBC 4.6 4.5  HGB 10.3* 10.3*  HCT 31.6* 33.0*  MCV 88.3 89.4  PLT 212 211    Cardiac Enzymes: No results for input(s): "CKTOTAL", "CKMB", "CKMBINDEX", "TROPONINIHS" in the last 72 hours.  BNP: No results for input(s): "BNP" in the last 72 hours.  D-Dimer: No results for input(s): "DDIMER" in the last 72 hours. Hemoglobin A1C: No results for input(s): "HGBA1C" in the last 72 hours. Fasting Lipid Panel: No results for input(s): "CHOL", "HDL", "LDLCALC", "TRIG", "CHOLHDL", "LDLDIRECT" in the last 72 hours. Thyroid Function Tests: No results for input(s): "TSH", "T4TOTAL", "T3FREE", "THYROIDAB" in the last 72 hours.  Invalid input(s): "FREET3" Anemia Panel: No results for input(s): "VITAMINB12", "FOLATE", "FERRITIN", "TIBC", "IRON", "RETICCTPCT" in the last 72 hours.   Radiology: DG Chest 2 View  Result Date: 08/03/2022 CLINICAL DATA:  Shortness of breath EXAM: CHEST - 2 VIEW COMPARISON:  07/10/2022, chest CT 06/02/2022, 12/02/2021 FINDINGS: Post sternotomy changes and valve prosthesis. Left-sided pacing device. Mild diffuse reticular opacity with hazy ground-glass opacity at the bases likely related to chronic lung disease on prior CT. No confluent acute airspace disease. IMPRESSION: Mild diffuse reticular opacity with hazy  ground-glass opacity at the bases likely related to chronic lung disease on prior CT. No definite acute superimposed airspace disease Electronically Signed   By: Donavan Foil M.D.   On: 08/03/2022 21:30   US Abdomen Limited RUQ (LIVER/GB)  Result Date: 07/10/2022 CLINICAL DATA:  Epigastric pain EXAM: ULTRASOUND ABDOMEN LIMITED RIGHT UPPER QUADRANT COMPARISON:  None Available. FINDINGS: Gallbladder: Numerous small gallstones visualized. No gallbladder wall thickening. No sonographic Murphy sign noted by sonographer. Common bile duct: Diameter: 3.8 mm Liver: No focal lesion identified. Normal parenchymal echogenicity. Portal vein is patent on color Doppler imaging with normal direction of blood flow towards the liver. Other: None. IMPRESSION: Cholelithiasis without sonographic evidence of acute cholecystitis. Electronically Signed   By: Yetta Glassman M.D.   On: 07/10/2022 18:01   DG Chest 2 View  Result Date: 07/10/2022 CLINICAL DATA:  Shortness of breath EXAM: CHEST - 2 VIEW COMPARISON:  Chest radiograph November 27, 2021 and CT June 02, 2022 FINDINGS: Left chest pacemaker with leads projecting over the right atrial appendage and right ventricle. Aortic valve prosthesis. Enlarged cardiac silhouette. Prior median sternotomy. Aortic atherosclerosis. Central vascular prominence. Bibasilar predominant interstitial opacities. No visible pleural effusion or pneumothorax. Right axillary surgical clips. Degenerative changes spine and bilateral shoulders. IMPRESSION: Enlarged cardiac silhouette with central vascular prominence and bibasilar predominant interstitial opacities superimposed on chronic lung changes, possibly reflecting pulmonary edema. Electronically  Signed   By: Dahlia Bailiff M.D.   On: 07/10/2022 17:33    ECHO 07/24/2022 2D DIMENSIONS  AORTA          Values     Normal RangeMAIN PA      Values     Normal Range       Annulus:  nm*  cm    [1.9 - 2.7]    PA Main:   2.1 cm    [1.5 - 2.1]      Aorta Sin:   2.2 cm    [2.4 - 3.6] RIGHT VENTRICLE   ST Junction:  nm*  cm    [2 - 3.2]      RV Base:   3.4 cm    [2.5 - 4.1]     Asc.Aorta:   2.9 cm    [1.9 - 3.5]     RV Mid:   2.4 cm    [1.9 - 3.5]  LEFT VENTRICLE                         RV Length:  nm*  cm    [  ]         LVIDd:   3.8 cm    [3.7 - 5.3] RIGHT ATRIUM         LVIDs:   2.3 cm    [2.2 - 3.4]    RA Area:  21   cm2   [ <= 20]        LVEDVi:  nm*  ml/m2 [29 - 61]         RAVi:  33   ml/m2 [15 - 27]        LVESVi:  nm*  ml/m2 [8 - 24]    INFERIOR VENA CAVA            FS:  39   %     [ >= 25]       Max.IVC:   1.7 cm    [ <= 2.1]           SWT:   1.2 cm    [0.6 - 0.9]    Min.IVC:  nm*  cm    [ <= 1.7]           PWT:   1.2 cm    [0.6 - 0.9] __________________  LEFT ATRIUM                           nm* - not measured       LA Diam:   4.5 cm    [2.7 - 3.8]       LA Area:  27   cm2   [ <= 20]     LA Volume:  74   ml    [22 - 52]          LAVi:  40   ml/m2 [16 - 34]   ECHOCARDIOGRAPHIC DESCRIPTIONS -----------------------------------------------  AORTIC ROOT          Size: Normal    Dissection: INDETERM FOR DISSECTION   AORTIC VALVE      Leaflets: BIOPROSTHETIC         Morphology: Normal      Mobility: Fully Mobile      AOV Note:  68mm   LEFT VENTRICLE  Anterior: Normal          Size: Normal                                 Lateral: Normal   Contraction: Normal                                  Septal: Normal    Closest EF: >55% (Estimated)                        Apical: Normal     LV masses: No Masses                             Inferior: Normal           LVH: MILD LVH CONCENTRIC                  Posterior: Normal  Dias.FxClass: RELAXATION ABNORMALITY (GRADE 2) CORRESPONDS TO PSEUDONORMAL   MITRAL VALVE      Leaflets: Normal                  Mobility: Fully mobile    Morphology: Normal   LEFT ATRIUM          Size: MILDLY ENLARGED     LA masses: No masses                Normal IAS    MAIN PA          Size: Normal   PULMONIC VALVE    Morphology: Normal      Mobility: Fully Mobile       PV Note:   PAAT= 54ms   RIGHT VENTRICLE          Size: Normal                    Free wall: Normal   Contraction: Normal                    RV masses: No Masses         TAPSE:   1.0 cm,  Normal Range [>= 1.6 cm]       RV Note: TV S'= 0.65m/s   TRICUSPID VALVE      Leaflets: Normal                  Mobility: Fully mobile    Morphology: Normal   RIGHT ATRIUM          Size: Normal                     RA Other: None     RA masses: No masses   PERICARDIUM        Fluid: No effusion   INFERIOR VENACAVA          Size: Normal     Normal respiratory collapse   DOPPLER ECHO and OTHER SPECIAL PROCEDURES ------------------------------------     Aortic: TRIVIAL AR             BIOPROSTHETIC AoV      4.0 m/s peak vel   65 mmHg peak grad  41 mmHg mean grad 0.9 cm2 by DOPPLER   LVOT Diam: 1.9 cm. Resting LVOT Vel: 1.4 m/s. Dimensionless Index: 0.32  Mitral: MILD MR                No MS     MV Inflow E Vel.= 74.0 cm/s  MV Annulus E'Vel.= 4.0 cm/s  E/E'Ratio= 19   Tricuspid: TRIVIAL TR             No TS             2.7 m/s peak TR vel   29 mmHg peak RV pressure   Pulmonary: TRIVIAL PR             No PS       Other:   INTERPRETATION ---------------------------------------------------------------    NORMAL LEFT VENTRICULAR SYSTOLIC FUNCTION WITH MILD LVH    ELEVATED LA PRESSURES WITH DIASTOLIC DYSFUNCTION    NORMAL RIGHT VENTRICULAR SYSTOLIC FUNCTION    VALVULAR REGURGITATION: TRIVIAL AR, MILD MR, TRIVIAL PR, TRIVIAL TR    PROSTHETIC VALVE(S): BIOPROSTHETIC AoV    UNABLE TO OBTAIN IV ACCESS FOR USE OF DEFINITY     Compared with prior Echo study on 02/07/2020: SIGNIFICANT INCREASE IN AV    GRADIENTS, SMALLER LV CAVITY  AND HIGH DIMENSIONLESS INDEX SUGGESTS    UNDERFILLING OF THE LV MAY BE THE CAUSE OF THE HIGHER AOV GRADIENTS.   TELEMETRY reviewed by me (LT) 08/07/2022 : Not  available as patient refused on 11/30.  EKG reviewed by me: AV paced rhythm 70, similar to prior from 11/27  Data reviewed by me (LT) 08/07/2022: hospitalist progress ntoe, nursing notes, I/O , CBC BMP vitals ekgs  Principal Problem:   Heart failure due to valvular disease, acute on chronic, diastolic ([name]) Active Problems:   Essential hypertension   Paroxysmal atrial fibrillation ([place])   History of aortic valve replacement with bioprosthetic valve   Chronic obstructive pulmonary disease ([name])   GAD (generalized anxiety disorder)   Hypothyroidism   Class 2 obesity with alveolar hypoventilation, serious comorbidity, and body mass index (BMI) of 39.0 to 39.9 in adult [facility])   Depression   Hypokalemia    ASSESSMENT AND PLAN:  [name] is a 39yoF with a PMH of 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 Cardiology is consulted for assistance with her heart failure.   # Acute on chronic hypoxic respiratory failure  # suspicious lung mass  Reportedly desaturating with exertion to the 80s on her baseline of 4 L.  She had an abnormal PET/CT showing a RUL nodule that is pending biopsy followed by pulmonology (per patient, scheduled for 12/12)   # Acute on chronic HFpEF # Bicuspid AV s/p bioprosthetic AVR (2008) Reports shortness of breath that is multifactorial, with orthopnea that was refractory to increased dose of Bumex outpatient. Echo performed 11/17 at [place] showed preserved EF but increased AV gradient (20mmHg) for which TAVR CT is planned. BNP elevated above her baseline at 256, compared to 120 when it was checked 3 weeks ago.  Reports early satiety and "bloating" that she feels is related to gallbladder issues and previous constipation.   Suspect dietary indiscretions playing a role. Ultimately feeling less short of breath with ambulation on hospital day 2.  -S/p IV Bumex.  -Continue home bumex 2mg  PO BID.  -Continue GDMT with coreg 6.25mg  BID, lisinopril 40mg . Restart Jardiance 10mg   at discharge. Consider spiro on an outpatient basis. -recommend continued follow up with Dr. Jenne Pane outpatient for further redo AVR eval  -Stressed again the importance of salt and fluid restriction -She is okay for discharge from a cardiac standpoint today, encouraged to keep her currently scheduled appointments with heart failure clinic, pulmonology, CT surgery, and [facility] cardiology  # paroxysmal AF s/p AVN ablation & DC PPM  AV paced rhythm on tele.  -continue coreg 6.25 BID  -continue eliquis 5mg  BID for stroke prevention  # atypical chest pain # GERD On 11/30 she had atypical pain without EKG changes or change in rhythm/rate on tele. Occurred 1 hour following breakfast. Relieved with percocet.  Usually takes protonix prior to breakfast (she was given this medication after breakfast on 11/30).   This patient's plan of care was discussed and created with Dr. Clayborn Bigness and he is in agreement.  Signed: Tristan Schroeder , PA-C 08/07/2022, 9:52 AM [facility]

## 2022-08-10 ENCOUNTER — Telehealth: Payer: Self-pay | Admitting: Pain Medicine

## 2022-08-10 ENCOUNTER — Telehealth: Payer: Self-pay

## 2022-08-10 ENCOUNTER — Telehealth: Payer: Self-pay | Admitting: Internal Medicine

## 2022-08-10 NOTE — Telephone Encounter (Signed)
Ok thank you, we can change them to virual ,she should see DSK

## 2022-08-10 NOTE — Telephone Encounter (Signed)
I s/w patient regarding HF. She stated she is following  up with cardiologist and will see [place] on 08/20/22-Toni

## 2022-08-10 NOTE — Telephone Encounter (Signed)
error 

## 2022-08-11 ENCOUNTER — Encounter: Payer: Self-pay | Admitting: Family

## 2022-08-11 ENCOUNTER — Ambulatory Visit: Payer: Medicare Other | Attending: Family | Admitting: Family

## 2022-08-11 VITALS — BP 118/76 | HR 70 | Resp 18

## 2022-08-11 DIAGNOSIS — Z79899 Other long term (current) drug therapy: Secondary | ICD-10-CM | POA: Insufficient documentation

## 2022-08-11 DIAGNOSIS — I110 Hypertensive heart disease with heart failure: Secondary | ICD-10-CM | POA: Insufficient documentation

## 2022-08-11 DIAGNOSIS — Z950 Presence of cardiac pacemaker: Secondary | ICD-10-CM | POA: Diagnosis not present

## 2022-08-11 DIAGNOSIS — I5032 Chronic diastolic (congestive) heart failure: Secondary | ICD-10-CM | POA: Diagnosis not present

## 2022-08-11 DIAGNOSIS — I4891 Unspecified atrial fibrillation: Secondary | ICD-10-CM | POA: Diagnosis not present

## 2022-08-11 DIAGNOSIS — I10 Essential (primary) hypertension: Secondary | ICD-10-CM

## 2022-08-11 DIAGNOSIS — Z9981 Dependence on supplemental oxygen: Secondary | ICD-10-CM | POA: Diagnosis not present

## 2022-08-11 DIAGNOSIS — I480 Paroxysmal atrial fibrillation: Secondary | ICD-10-CM

## 2022-08-11 DIAGNOSIS — Z7984 Long term (current) use of oral hypoglycemic drugs: Secondary | ICD-10-CM | POA: Insufficient documentation

## 2022-08-11 DIAGNOSIS — Q231 Congenital insufficiency of aortic valve: Secondary | ICD-10-CM | POA: Insufficient documentation

## 2022-08-11 DIAGNOSIS — J449 Chronic obstructive pulmonary disease, unspecified: Secondary | ICD-10-CM | POA: Insufficient documentation

## 2022-08-11 NOTE — Progress Notes (Unsigned)
PROVIDER NOTE: Information contained herein reflects review and annotations entered in association with encounter. Interpretation of such information and data should be left to medically-trained personnel. Information provided to patient can be located elsewhere in the medical record under "Patient Instructions". Document created using STT-dictation technology, any transcriptional errors that may result from process are unintentional.    Patient: [name]  Service Category: E/M  Provider: Gaspar Cola, MD  DOB: 15-May-1946  DOS: 08/12/2022  Referring Provider: [name], MD  MRN: [id number]  Specialty: Interventional Pain Management  PCP: Andrea Guise, MD  Type: Established Patient  Setting: Ambulatory outpatient    Location: Office  Delivery: Face-to-face     HPI  [name], a 76 y.o. year old female, is here today because of her No primary diagnosis found.. [name]'s primary complain today is No chief complaint on file. Last encounter: My last encounter with her was on 08/10/2022. Pertinent problems: Ms. 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; Shoulder impingement syndrome (Left); Chronic left shoulder pain; Chronic pain syndrome; Chronic shoulder pain (1ry area of Pain) (Bilateral) (L>R); Chronic upper extremity pain (2ry area of Pain) (Bilateral) (L>R); Cervicalgia; Chronic neck pain (3ry area of Pain) (Posterior) (Bilateral) (L>R); Shoulder blade pain (4th area of Pain) (Left); Osteoarthritis of glenohumeral joint (Left); Osteoarthritis of AC (acromioclavicular) joint (Left); Osteoarthritis of glenohumeral joints (Bilateral); Osteoarthritis of acromioclavicular joints (Bilateral); Primary osteoarthritis of shoulders (Bilateral); DDD (degenerative disc disease), cervical; Cervical radiculitis (Left); Cervical radiculopathy (Left); C6 radiculopathy (Left); C7 radiculopathy (Left); Unable to maintain body in lying position; Chronic intractable  headache; DISH (diffuse idiopathic skeletal hyperostosis); Abnormal CT scan, cervical spine (03/04/2022); Trigger point of shoulder region (Bilateral); Trigger point with neck pain; Chronic upper back pain; Chronic myofascial pain; and Musculoskeletal disorder involving upper trapezius muscle on their pertinent problem list. Pain Assessment: Severity of   is reported as a  /10. Location:    / . Onset:  . Quality:  . Timing:  . Modifying factor(s):  [name] Vitals:  vitals were not taken for this visit.  BMI: Estimated body mass index is 36.73 kg/m as calculated from the following:   Height as of 08/03/22: 5' (1.524 m).   Weight as of 08/07/22: 188 lb 0.8 oz (85.3 kg).  Reason for encounter:  *** . ***  Pharmacotherapy Assessment  Analgesic: Oxycodone/APAP 7.5/325, 1 tab p.o. daily (# 45) (last filled on 06/02/2021) by Erich Montane, FNP-C, working for Dr. Talmadge Chad, MD. MME/day: 7.5 mg/day   Monitoring: [place] PMP: PDMP not reviewed this encounter.       Pharmacotherapy: No side-effects or adverse reactions reported. Compliance: No problems identified. Effectiveness: Clinically acceptable.  No notes on file  No results found for: "CBDTHCR" No results found for: "D8THCCBX" No results found for: "D9THCCBX"  UDS:  Summary  Date Value Ref Range Status  04/30/2021 Note  Final    Comment:    ==================================================================== Compliance Drug Analysis, Ur ==================================================================== Test                             Result       Flag       Units  Drug Present and Declared for Prescription Verification   Alpha-hydroxyalprazolam        107          EXPECTED   ng/mg creat    Alpha-hydroxyalprazolam is an expected metabolite of alprazolam.  Source of alprazolam is a scheduled prescription medication.    Oxycodone                      1371         EXPECTED   ng/mg creat   Oxymorphone                    600           EXPECTED   ng/mg creat   Noroxycodone                   2311         EXPECTED   ng/mg creat    Sources of oxycodone include scheduled prescription medications.    Oxymorphone and noroxycodone are expected metabolites of oxycodone.    Oxymorphone is also available as a scheduled prescription medication.    Sertraline                     PRESENT      EXPECTED   Desmethylsertraline            PRESENT      EXPECTED    Desmethylsertraline is an expected metabolite of sertraline.    Trazodone                      PRESENT      EXPECTED   1,3 chlorophenyl piperazine    PRESENT      EXPECTED    1,3-chlorophenyl piperazine is an expected metabolite of trazodone.    Acetaminophen                  PRESENT      EXPECTED  Drug Absent but Declared for Prescription Verification   Diclofenac                     Not Detected UNEXPECTED    Diclofenac, as indicated in the declared medication list, is not    always detected even when used as directed.  ==================================================================== Test                      Result    Flag   Units      Ref Range   Creatinine              45               mg/dL      >=20 ==================================================================== Declared Medications:  The flagging and interpretation on this report are based on the  following declared medications.  Unexpected results may arise from  inaccuracies in the declared medications.   **Note: The testing scope of this panel includes these medications:   Alprazolam (Xanax)  Oxycodone (Percocet)  Sertraline (Zoloft)  Trazodone (Desyrel)   **Note: The testing scope of this panel does not include small to  moderate amounts of these reported medications:   Acetaminophen (Tylenol)  Acetaminophen (Percocet)  Diclofenac (Voltaren)   **Note: The testing scope of this panel does not include the  following reported medications:   Albuterol (Ventolin HFA)  Apixaban  (Eliquis)  Atorvastatin (Lipitor)  Bumetanide (Bumex)  Calcium  Flecainide (Tambocor)  Fluticasone (Trelegy)  Iron  Levothyroxine (Synthroid)  Lisinopril (Zestril)  Loratadine (Claritin)  Pantoprazole (Protonix)  Potassium (Klor-Con)  Ubiquinone (CoQ10)  Umeclidinium (Trelegy)  Vilanterol (Trelegy)  Vitamin D ==================================================================== For clinical consultation, please call [phone]. ====================================================================  ROS  Constitutional: Denies any fever or chills Gastrointestinal: No reported hemesis, hematochezia, vomiting, or acute GI distress Musculoskeletal: Denies any acute onset joint swelling, redness, loss of ROM, or weakness Neurological: No reported episodes of acute onset apraxia, aphasia, dysarthria, agnosia, amnesia, paralysis, loss of coordination, or loss of consciousness  Medication Review  ALPRAZolam, Budeson-Glycopyrrol-Formoterol, Calcium Carbonate-Vitamin D, CoQ10, albuterol, apixaban, atorvastatin, bumetanide, carvedilol, diclofenac Sodium, empagliflozin, ferrous sulfate, ipratropium-albuterol, levothyroxine, lisinopril, loratadine, ondansetron, oxyCODONE-acetaminophen, pantoprazole, potassium chloride, sertraline, sodium chloride, and traZODone  History Review  Allergy: Ms. Raisch is allergic to benadryl [diphenhydramine hcl (sleep)], cetirizine, lasix [furosemide], levaquin [levofloxacin in d5w], meloxicam, soy allergy, and sulfa antibiotics. Drug: Ms. Janus  reports no history of drug use. Alcohol:  reports no history of alcohol use. Tobacco:  reports that she has quit smoking. Her smoking use included cigarettes. She has never used smokeless tobacco. Social: Ms. Campos  reports that she has quit smoking. Her smoking use included cigarettes. She has never used smokeless tobacco. She reports that she does not drink alcohol and does not use drugs. Medical:  has a past  medical history of Aortic stenosis due to bicuspid aortic valve, Aspiration pneumonia ([name]), Atrial fibrillation with RVR ([place]) (01/11/2015), Atrial flutter ([place]), Basal skull fracture ([name]) (20 yrs ago), CHF (congestive heart failure) ([place]), Chronic respiratory failure ([place]), COPD (chronic obstructive pulmonary disease) ([place]), Deafness in left ear, Essential hypertension (01/24/2015), History of cardiac cath, History of stress test, HLD (hyperlipidemia), HTN (hypertension), Hypothyroidism, Obesity, PAF (paroxysmal atrial fibrillation) ([place]), Paroxysmal atrial fibrillation ([place]) (01/24/2015), Right upper quadrant abdominal tenderness without rebound tenderness (03/02/2018), and S/P ascending aortic aneurysm repair (2008). Surgical: Ms. Markham  has a past surgical history that includes Abdominal aortic aneurysm repair (2008); Aortic valve replacement (2008); Abdominal hysterectomy; Carpal tunnel release; Tumor excision (Left); Cardiac catheterization; Cardiac catheterization (N/A, 08/17/2016); CARDIOVERSION (N/A, 08/15/2018); CARDIOVERSION (N/A, 11/14/2018); Cardioversion (N/A, 06/05/2019); Cardioversion (N/A, 08/14/2019); Cardioversion (N/A, 11/09/2019); and Cardioversion (N/A, 01/17/2020). Family: family history includes Stroke in her father and paternal grandmother.  Laboratory Chemistry Profile   Renal Lab Results  Component Value Date   BUN 20 08/07/2022   CREATININE 0.86 08/07/2022   BCR 21 03/20/2022   GFRAA >60 05/14/2020   GFRNONAA >60 08/07/2022    Hepatic Lab Results  Component Value Date   AST 18 08/03/2022   ALT 15 08/03/2022   ALBUMIN 3.6 08/03/2022   ALKPHOS 64 08/03/2022   LIPASE 30 07/10/2022    Electrolytes Lab Results  Component Value Date   NA 139 08/07/2022   K 3.5 08/07/2022   CL 102 08/07/2022   CALCIUM 9.0 08/07/2022   MG 2.2 08/07/2022   PHOS 3.5 01/30/2021    Bone Lab Results  Component Value Date   VD25OH 25.5 (L) 11/25/2020   25OHVITD1 31 04/30/2021   25OHVITD2  <1.0 04/30/2021   25OHVITD3 31 04/30/2021    Inflammation (CRP: Acute Phase) (ESR: Chronic Phase) Lab Results  Component Value Date   CRP 6 04/30/2021   ESRSEDRATE 40 04/30/2021   LATICACIDVEN 1.5 01/25/2022         Note: Above Lab results reviewed.  Recent Imaging Review  DG Chest 2 View CLINICAL DATA:  Shortness of breath  EXAM: CHEST - 2 VIEW  COMPARISON:  07/10/2022, chest CT 06/02/2022, 12/02/2021  FINDINGS: Post sternotomy changes and valve prosthesis. Left-sided pacing device. Mild diffuse reticular opacity with hazy ground-glass opacity at the bases likely related to chronic lung disease on prior CT. No confluent acute airspace disease.  IMPRESSION:  Mild diffuse reticular opacity with hazy ground-glass opacity at the bases likely related to chronic lung disease on prior CT. No definite acute superimposed airspace disease  Electronically Signed   By: Donavan Foil M.D.   On: 08/03/2022 21:30 Note: Reviewed        Physical Exam  General appearance: Well nourished, well developed, and well hydrated. In no apparent acute distress Mental status: Alert, oriented x 3 (person, place, & time)       Respiratory: No evidence of acute respiratory distress Eyes: PERLA Vitals: There were no vitals taken for this visit. BMI: Estimated body mass index is 36.73 kg/m as calculated from the following:   Height as of 08/03/22: 5' (1.524 m).   Weight as of 08/07/22: 188 lb 0.8 oz (85.3 kg). Ideal: Ideal body weight: 45.5 kg (100 lb 4.9 oz) Adjusted ideal body weight: 61.4 kg (135 lb 6.5 oz)  Assessment   Diagnosis Status  No diagnosis found. Controlled Controlled Controlled   Updated Problems: No problems updated.  Plan of Care  Problem-specific:  No problem-specific Assessment & Plan notes found for this encounter.  Ms. DELAILA NAND has a current medication list which includes the following long-term medication(s): apixaban, atorvastatin, bumetanide, calcium  carbonate-vitamin d, carvedilol, ferrous sulfate, ipratropium-albuterol, levothyroxine, lisinopril, pantoprazole, potassium chloride, sertraline, trazodone, and ventolin hfa.  Pharmacotherapy (Medications Ordered): No orders of the defined types were placed in this encounter.  Orders:  No orders of the defined types were placed in this encounter.  Follow-up plan:   No follow-ups on file.     Interventional Therapies  Risk  Complexity Considerations:   Estimated body mass index is 38.87 kg/m as calculated from the following:   Height as of this encounter: _0  (1.473 m).   Weight as of this encounter: 186 lb (84.4 kg). 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 DISH: (Left C7-T1 calcified ligamentum flavum) (see 10/04/2021 cervical CT Se: 7 Im: 39/81; Se: 9 Im: 56/88; Se: 11 Im: 76/88; Se: 5 Im: 63/76)   Planned  Pending:      Under consideration:   Palliative bilateral trapezius and supraspinatus m. MNB/TPI #3    Completed:   Diagnostic bilateral suprascapular NB x2 (09/25/2021) (100/100/100/100 x 2.5 months)  Therapeutic bilateral Trapezius and supraspinatus MNB x2 (06/16/2022) (100/100/90/90)    Therapeutic  Palliative (PRN) options:   Therapeutic upper back and shoulder (trapezius & supraspinatus) MNB/TPI #2       Recent Visits Date Type Provider Dept  07/02/22 Office Visit Milinda Pointer, MD [facility]  06/16/22 Procedure visit Milinda Pointer, MD [facility]  Showing recent visits within past 90 days and meeting all other requirements Future Appointments Date Type Provider Dept  08/12/22 Appointment Milinda Pointer, MD [facility]  Showing future appointments within next 90 days and meeting all other requirements  I  discussed the assessment and treatment plan with the patient. The patient was provided an opportunity to ask questions and all were answered. The patient agreed with the plan and demonstrated an understanding of the instructions.  Patient advised to call back or seek an in-person evaluation if the symptoms or condition worsens.  Duration of encounter: *** minutes.  Total time on encounter, as per AMA guidelines included both the face-to-face and non-face-to-face time personally spent by the physician  and/or other qualified health care professional(s) on the day of the encounter (includes time in activities that require the physician or other qualified health care professional and does not include time in activities normally performed by clinical staff). Physician's time may include the following activities when performed: preparing to see the patient (eg, review of tests, pre-charting review of records) obtaining and/or reviewing separately obtained history performing a medically appropriate examination and/or evaluation counseling and educating the patient/family/caregiver ordering medications, tests, or procedures referring and communicating with other health care professionals (when not separately reported) documenting clinical information in the electronic or other health record independently interpreting results (not separately reported) and communicating results to the patient/ family/caregiver care coordination (not separately reported)  Note by: Gaspar Cola, MD Date: 08/12/2022; Time: 4:37 PM

## 2022-08-11 NOTE — Progress Notes (Signed)
Patient ID: [name], female    DOB: 05-Jul-1946, 76 y.o.   MRN: [id number]  HPI  [name] is a 76 y/o female with a history of hyperlipidemia, HTN, thyroid disease, COPD, left ear deafness, atrial fibrillation, previous tobacco use and chronic heart failure.   Echo report from 07/24/22 showed an EF of >55% along with mild LVH. Echo report from 11/11/21 reviewed and showed an EF of 60-65%. Echo report from 05/16/21 reviewed and showed an EF of 45% along with mild LVH. Echo report from 02/07/20 reviewed and showed an EF of >55% along with severe LAE. Catheterization done 02/07/20 but unable to view the results.   Admitted 08/04/22 due to worsening shortness of breath with exertion from her baseline associated with increased abdominal girth and orthopnea for about 4 days. Initially given IV lasix with transition to oral diuretics. Cardiology consult obtained. Discharged after 3 days. Was in the ED 07/10/22 due to upper abdominal pain. Serum labs are all normal. Chest x-ray and right upper quadrant ultrasound also unremarkable.  She has known cholelithiasis which is again demonstrated today.    She presents today for a follow-up visit with a chief complaint of moderate shortness of breath with minimal exertion. Describes this as chronic in nature although has worsened over the last year. She has associated fatigue, decreased appetite, cough, abdominal distention, difficulty sleeping, chronic pain and light-headedness along with this. She denies any palpitations, pedal edema, chest pain or weight gain.   Weighing daily and says that her home weight ranges from 185-186 pounds. Does not want to get out of the wheelchair today to be weighed  Oxygen is at 5L although her portable tank is a pulse tank and only goes to 3L. She says that her insurance didn't approve the "other tank" but that she can't go anywhere with the pulse tank because she gets too short of breath  Past Medical History:  Diagnosis Date    Aortic 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 s/p bioprosthetic valve replacement 2008 at [facility];  b. 01/2015 Echo: EF 60-65%, no rwma, Gr1 DD, mildly dil LA, nl RV fxn.   Aspiration pneumonia ([place])    Atrial fibrillation with RVR ([place]) 01/11/2015   Atrial flutter ([place])    a. 08/2016 s/p DCCV.  Remains on flecainide 50 mg bid.   Basal skull fracture ([name]) 20 yrs ago   CHF (congestive heart failure) ([name])    Chronic respiratory failure ([name])    COPD (chronic obstructive pulmonary disease) ([place])    a. 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 2008 prior to Aortic aneurysm repair-->nl cors.   History of stress test    a. 10/2015 MV: no ischemia/infarct.   HLD (hyperlipidemia)    HTN (hypertension)    Hypothyroidism    Obesity    PAF (paroxysmal atrial fibrillation) ([name])    a. on Eliquis; b. CHADS2VASc = 3 (HTN, age x 59, female).   Paroxysmal atrial fibrillation ([place]) 01/24/2015   Right upper quadrant abdominal tenderness without rebound tenderness 03/02/2018   S/P ascending aortic aneurysm repair 2008   Past Surgical History:  Procedure Laterality Date   ABDOMINAL AORTIC ANEURYSM REPAIR  2008   ABDOMINAL HYSTERECTOMY     AORTIC VALVE REPLACEMENT  2008   CARDIAC CATHETERIZATION  [place]   CARDIOVERSION N/A 08/15/2018   Procedure: CARDIOVERSION;  Surgeon: [name], MD;  Location: [facility] ORS;  Service: Cardiovascular;  Laterality: N/A;   CARDIOVERSION N/A 11/14/2018   Procedure: CARDIOVERSION (CATH LAB);  Surgeon: [name], MD;  Location: [place] [facility];  Service: Cardiovascular;  Laterality: N/A;   CARDIOVERSION N/A 06/05/2019   Procedure: CARDIOVERSION;  Surgeon: [name], MD;  Location: [place] [facility];  Service: Cardiovascular;  Laterality: N/A;   CARDIOVERSION N/A 08/14/2019   Procedure: CARDIOVERSION;  Surgeon: [name], MD;  Location: [place] [facility];   Service: Cardiovascular;  Laterality: N/A;   CARDIOVERSION N/A 11/09/2019   Procedure: CARDIOVERSION;  Surgeon: Isaias Cowman, MD;  Location: [place] [facility];  Service: Cardiovascular;  Laterality: N/A;   CARDIOVERSION N/A 01/17/2020   Procedure: CARDIOVERSION;  Surgeon: Isaias Cowman, MD;  Location: [facility];  Service: Cardiovascular;  Laterality: N/A;   CARPAL TUNNEL RELEASE     ELECTROPHYSIOLOGIC STUDY N/A 08/17/2016   Procedure: Cardioversion;  Surgeon: [name], MD;  Location: [facility];  Service: Cardiovascular;  Laterality: N/A;   TUMOR EXCISION Left    x3 (arm)   Family History  Problem Relation Age of Onset   Stroke Father    Stroke Paternal Grandmother    Social History   Tobacco Use   Smoking status: Former    Types: Cigarettes   Smokeless tobacco: Never  Substance Use Topics   Alcohol use: No   Allergies  Allergen Reactions   Benadryl [Diphenhydramine Hcl (Sleep)] Palpitations   Cetirizine Palpitations   Lasix [Furosemide] Rash   Levaquin [Levofloxacin In D5w] Other (See Comments)    Reaction:  Fatigue and muscle soreness   Meloxicam Rash   Soy Allergy Hives and Nausea And Vomiting   Sulfa Antibiotics Rash   Prior to Admission medications   Medication Sig Start Date End Date Taking? Authorizing Provider  ALPRAZolam (XANAX) 0.25 MG tablet TAKE 1 TABLET(0.25 MG) BY MOUTH TWICE DAILY AS NEEDED FOR ANXIETY Patient taking differently: Take 0.25 mg by mouth 2 (two) times daily as needed for anxiety. 06/08/22  Yes [name], MD  apixaban (ELIQUIS) 5 MG TABS tablet Take 1 tablet (5 mg total) by mouth 2 (two) times daily. 11/04/21  Yes [name], MD  atorvastatin (LIPITOR) 40 MG tablet TAKE 1 TABLET BY MOUTH EVERY NIGHT AT BEDTIME 03/26/22  Yes [name], MD  Budeson-Glycopyrrol-Formoterol (BREZTRI AEROSPHERE) 160-9-4.8 MCG/ACT AERO Inhale 2 puffs into the lungs 2 (two) times daily. 01/22/22  Yes Abernathy, Yetta Flock, NP  Calcium Carbonate-Vitamin D  (CALCIUM 600+D PO) Take 1 tablet by mouth daily.   Yes [provider]  carvedilol (COREG) 6.25 MG tablet Take 1 tablet (6.25 mg total) by mouth 2 (two) times daily with a meal. 12/03/21  Yes Dwyane Dee, MD  Coenzyme Q10 (COQ10) 200 MG CAPS Take 200 mg by mouth daily.   Yes [provider]  diclofenac Sodium (VOLTAREN) 1 % GEL SMARTSIG:Gram(s) Topical Twice Daily 09/09/20  Yes [provider]  empagliflozin (JARDIANCE) 10 MG TABS tablet Take 1 tablet (10 mg total) by mouth daily before breakfast. 04/27/22  Yes Darylene Price A, FNP  ferrous sulfate 325 (65 FE) MG tablet Take 1 tablet (325 mg total) by mouth daily with breakfast. 12/03/21  Yes Dwyane Dee, MD  ipratropium-albuterol (DUONEB) 0.5-2.5 (3) MG/3ML SOLN Inhale 3 mLs into the lungs every 4 (four) hours as needed. Inhale 3 mls into the lungs every 4 to 6 hours and as  needed 01/29/22  Yes Allyne Gee, MD  levothyroxine (SYNTHROID) 25 MCG tablet TAKE 1 TABLET BY MOUTH DAILY BEFORE BREAKFAST 03/22/22  Yes [name], MD  lisinopril (ZESTRIL) 40 MG tablet TAKE 1 TABLET(40 MG) BY MOUTH DAILY(DOSE INCREASE) 09/29/21  Yes Darylene Price A, FNP  loratadine (CLARITIN) 10 MG tablet Take 10 mg by mouth daily.   Yes [provider]  oxyCODONE-acetaminophen (PERCOCET) 7.5-325 MG tablet Take one tab po bid for pain severe Patient taking differently: Take 1 tablet by mouth every 12 (twelve) hours as needed for moderate pain. 08/03/22  Yes Abernathy, Yetta Flock, NP  pantoprazole (PROTONIX) 40 MG tablet TAKE 1 TABLET BY MOUTH EVERY DAY 03/26/22  Yes [name], MD  potassium chloride (KLOR-CON) 10 MEQ tablet Take 4 tablets (40 mEq total) by mouth 2 (two) times daily. 10/06/21  Yes Abernathy, Yetta Flock, NP  sertraline (ZOLOFT) 100 MG tablet Take one tab a day for Anxiety Patient taking differently: Take 100 mg by mouth daily. Take one tab a day for Anxiety 11/04/21  Yes [name], MD  sodium chloride (OCEAN) 0.65 % SOLN  nasal spray Place 1 spray into both nostrils as needed for congestion.   Yes [provider]  traZODone (DESYREL) 50 MG tablet Take 1 tablet (50 mg total) by mouth at bedtime. 04/03/22  Yes [name], MD  VENTOLIN HFA 108 [phone] Base) MCG/ACT inhaler INHALE 2 PUFFS INTO THE LUNGS EVERY 6 HOURS AS NEEDED FOR WHEEZING OR SHORTNESS OF BREATH 06/11/22  Yes [name], MD  bumetanide (BUMEX) 1 MG tablet Take 2 tablets (2 mg total) by mouth 2 (two) times daily. And additional 2 tablets PM PRN 08/07/22   Wyvonnia Dusky, MD  ondansetron (ZOFRAN-ODT) 4 MG disintegrating tablet Take 1 tablet (4 mg total) by mouth every 8 (eight) hours as needed for nausea or vomiting. Patient not taking: Reported on 08/11/2022 07/10/22   Carrie Mew, MD    Review of Systems  Constitutional:  Positive for appetite change (decreased) and fatigue.  HENT:  Positive for hearing loss and voice change (hoarseness).   Eyes: Negative.   Respiratory:  Positive for cough (dry) and shortness of breath (easily with exertion).   Cardiovascular:  Negative for chest pain, palpitations and leg swelling.  Gastrointestinal:  Positive for abdominal distention. Negative for abdominal pain and constipation.  Endocrine: Negative.   Genitourinary: Negative.   Musculoskeletal:  Positive for arthralgias (bilateral shoulder pain) and back pain (right lower back).  Skin: Negative.   Allergic/Immunologic: Negative.   Neurological:  Positive for light-headedness (at times). Negative for dizziness.  Hematological:  Negative for adenopathy. Does not bruise/bleed easily.  Psychiatric/Behavioral:  Positive for sleep disturbance (trouble sleeping due to shoulder pain; wearing cpap at bedtime). Negative for dysphoric mood. The patient is not nervous/anxious.    Vitals:   08/11/22 1536  BP: 118/76  Pulse: 70  Resp: 18  SpO2: 98%   Wt Readings from Last 3 Encounters:  08/07/22 188 lb 0.8 oz (85.3 kg)  07/14/22 189 lb (85.7 kg)   07/13/22 188 lb 9.6 oz (85.5 kg)   Lab Results  Component Value Date   CREATININE 0.86 08/07/2022   CREATININE 0.96 08/06/2022   CREATININE 0.88 08/05/2022   Physical Exam Vitals and nursing note reviewed.  Constitutional:      Appearance: Normal appearance.  HENT:     Head: Normocephalic and atraumatic.     Left Ear: Decreased hearing noted.  Cardiovascular:  Rate and Rhythm: Normal rate and regular rhythm.     Heart sounds: Murmur heard.  Pulmonary:     Effort: Pulmonary effort is normal. No tachypnea or respiratory distress.     Breath sounds: No wheezing or rales.  Abdominal:     General: There is distension.     Palpations: Abdomen is soft.  Musculoskeletal:     Cervical back: Normal range of motion and neck supple.     Right lower leg: No edema.     Left lower leg: No edema.  Skin:    General: Skin is warm and dry.  Neurological:     General: No focal deficit present.     Mental Status: She is alert and oriented to person, place, and time.  Psychiatric:        Mood and Affect: Mood is not anxious.        Behavior: Behavior normal.        Thought Content: Thought content normal.    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)- - NYHA class III - euvolemic today - weighing daily & home weight ranges from 185-186 pounds; reminded to call for an overnight weight gain of >2 pounds or a weekly weight gain of >5 pounds - she didn't want to get out of the wheelchair today to be weighed - not adding salt and is using pepper for seasoning; reviewed the importance of closely following a low sodium diet  - on GDMT of lisinopril, carvedilol & jardiance - denied for patient assistance for jardiance - BNP 08/03/22 was 256.9  2: HTN- - BP looks good (118/76) - saw PCP (Abernathy) 07/13/22  - BMP 08/07/22 reviewed and showed sodium 139, potassium 3.5, creatinine 0.86 and GFR >60  3: severe COPD- - wearing  oxygen at 5L around the clock at home; when she comes to appointments, she has to put her portable tank on 3L/ this tank is pulse and she doesn't like it as much as her continuous oxygen at home - pulse ox on 5L was 98% - saw pulmonology Humphrey Rolls) 07/06/22  4: Atrial fibrillation-  - 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 Each medication was verbally reviewed with the patient and she was encouraged to bring the bottles to every visit to confirm accuracy of list.  Return in 3 months, sooner if needed.

## 2022-08-11 NOTE — Patient Instructions (Signed)
Continue weighing daily and call for an overnight weight gain of 3 pounds or more or a weekly weight gain of more than 5 pounds.   If you have voicemail, please make sure your mailbox is cleaned out so that we may leave a message and please make sure to listen to any voicemails.     

## 2022-08-12 ENCOUNTER — Ambulatory Visit (HOSPITAL_BASED_OUTPATIENT_CLINIC_OR_DEPARTMENT_OTHER): Payer: Medicare Other | Admitting: Pain Medicine

## 2022-08-12 ENCOUNTER — Telehealth: Payer: Self-pay | Admitting: Internal Medicine

## 2022-08-12 DIAGNOSIS — I7121 Aneurysm of the ascending aorta, without rupture: Secondary | ICD-10-CM | POA: Diagnosis not present

## 2022-08-12 DIAGNOSIS — Z91199 Patient's noncompliance with other medical treatment and regimen due to unspecified reason: Secondary | ICD-10-CM

## 2022-08-12 DIAGNOSIS — Z950 Presence of cardiac pacemaker: Secondary | ICD-10-CM | POA: Diagnosis not present

## 2022-08-12 DIAGNOSIS — I4891 Unspecified atrial fibrillation: Secondary | ICD-10-CM | POA: Diagnosis not present

## 2022-08-12 DIAGNOSIS — Q231 Congenital insufficiency of aortic valve: Secondary | ICD-10-CM | POA: Diagnosis not present

## 2022-08-12 NOTE — Telephone Encounter (Signed)
Called patient to let her know we will do letter of medical necessity to her insurance to see if they will allow her to switch o2 supply company due to o2 increase and health issues-Toni

## 2022-08-12 NOTE — Patient Instructions (Signed)
____________________________________________________________________________________________  Blood Thinners  IMPORTANT NOTICE:  If you take any of these, make sure to notify the nursing staff.  Failure to do so may result in injury.  Recommended time intervals to stop and restart blood-thinners, before & after invasive procedures  Generic Name Brand Name Pre-procedure. Stop this long before procedure. Post-procedure. Minimum waiting period before restarting.  Abciximab Reopro 15 days 2 hrs  Alteplase Activase 10 days 10 days  Anagrelide Agrylin    Apixaban Eliquis 3 days 6 hrs  Cilostazol Pletal 3 days 5 hrs  Clopidogrel Plavix 7-10 days 2 hrs  Dabigatran Pradaxa 5 days 6 hrs  Dalteparin Fragmin 24 hours 4 hrs  Dipyridamole Aggrenox 11days 2 hrs  Edoxaban Lixiana; Savaysa 3 days 2 hrs  Enoxaparin  Lovenox 24 hours 4 hrs  Eptifibatide Integrillin 8 hours 2 hrs  Fondaparinux  Arixtra 72 hours 12 hrs  Hydroxychloroquine Plaquenil 11 days   Prasugrel Effient 7-10 days 6 hrs  Reteplase Retavase 10 days 10 days  Rivaroxaban Xarelto 3 days 6 hrs  Ticagrelor Brilinta 5-7 days 6 hrs  Ticlopidine Ticlid 10-14 days 2 hrs  Tinzaparin Innohep 24 hours 4 hrs  Tirofiban Aggrastat 8 hours 2 hrs  Warfarin Coumadin 5 days 2 hrs   Other medications with blood-thinning effects  Product indications Generic (Brand) names Note  Cholesterol Lipitor Stop 4 days before procedure  Blood thinner (injectable) Heparin (LMW or LMWH Heparin) Stop 24 hours before procedure  Cancer Ibrutinib (Imbruvica) Stop 7 days before procedure  Malaria/Rheumatoid Hydroxychloroquine (Plaquenil) Stop 11 days before procedure  Thrombolytics  10 days before or after procedures   Over-the-counter (OTC) Products with blood-thinning effects  Product Common names Stop Time  Aspirin > 325 mg Goody Powders, Excedrin, etc. 11 days  Aspirin ? 81 mg  7 days  Fish oil  4 days  Garlic supplements  7 days  Ginkgo biloba  36  hours  Ginseng  24 hours  NSAIDs Ibuprofen, Naprosyn, etc. 3 days  Vitamin E  4 days   ____________________________________________________________________________________________   ______________________________________________________________________  Preparing for your procedure  During your procedure appointment there will be: No Prescription Refills. No disability issues to discussed. No medication changes or discussions.  Instructions: Food intake: Avoid eating anything solid for at least 8 hours prior to your procedure. Clear liquid intake: You may take clear liquids such as water up to 2 hours prior to your procedure. (No carbonated drinks. No soda.) Transportation: Unless otherwise stated by your physician, bring a driver. Morning Medicines: Except for blood thinners, take all of your other morning medications with a sip of water. Make sure to take your heart and blood pressure medicines. If your blood pressure's lower number is above 100, the case will be rescheduled. Blood thinners: If you take a blood thinner, but were not instructed to stop it, call our office [phone] and ask to talk to a nurse. Not stopping a blood thinner prior to certain procedures could lead to serious complications. Diabetics on insulin: Notify the staff so that you can be scheduled 1st case in the morning. If your diabetes requires high dose insulin, take only  of your normal insulin dose the morning of the procedure and notify the staff that you have done so. Preventing infections: Shower with an antibacterial soap the morning of your procedure.  Build-up your immune system: Take 1000 mg of Vitamin C with every meal (3 times a day) the day prior to your procedure. Antibiotics: Inform the nursing staff   if you are taking any antibiotics or if you have any conditions that may require antibiotics prior to procedures. (Example: recent joint implants)   Pregnancy: If you are pregnant make sure to  notify the nursing staff. Not doing so may result in injury to the fetus, including death.  Sickness: If you have a cold, fever, or any active infections, call and cancel or reschedule your procedure. Receiving steroids while having an infection may result in complications. Arrival: You must be in the facility at least 30 minutes prior to your scheduled procedure. Tardiness: Your scheduled time is also the cutoff time. If you do not arrive at least 15 minutes prior to your procedure, you will be rescheduled.  Children: Do not bring any children with you. Make arrangements to keep them home. Dress appropriately: There is always a possibility that your clothing may get soiled. Avoid long dresses. Valuables: Do not bring any jewelry or valuables.  Reasons to call and reschedule or cancel your procedure: (Following these recommendations will minimize the risk of a serious complication.) Surgeries: Avoid having procedures within 2 weeks of any surgery. (Avoid for 2 weeks before or after any surgery). Flu Shots: Avoid having procedures within 2 weeks of a flu shots or . (Avoid for 2 weeks before or after immunizations). Barium: Avoid having a procedure within 7-10 days after having had a radiological study involving the use of radiological contrast. (Myelograms, Barium swallow or enema study). Heart attacks: Avoid any elective procedures or surgeries for the initial 6 months after a "Myocardial Infarction" (Heart Attack). Blood thinners: It is imperative that you stop these medications before procedures. Let us know if you if you take any blood thinner.  Infection: Avoid procedures during or within two weeks of an infection (including chest colds or gastrointestinal problems). Symptoms associated with infections include: Localized redness, fever, chills, night sweats or profuse sweating, burning sensation when voiding, cough, congestion, stuffiness, runny nose, sore throat, diarrhea, nausea, vomiting, cold  or Flu symptoms, recent or current infections. It is specially important if the infection is over the area that we intend to treat. Heart and lung problems: Symptoms that may suggest an active cardiopulmonary problem include: cough, chest pain, breathing difficulties or shortness of breath, dizziness, ankle swelling, uncontrolled high or unusually low blood pressure, and/or palpitations. If you are experiencing any of these symptoms, cancel your procedure and contact your primary care physician for an evaluation.  Remember:  Regular Business hours are:  Monday to Thursday 8:00 AM to 4:00 PM  Provider's Schedule: Kailer Heindel, MD:  Procedure days: Tuesday and Thursday 7:30 AM to 4:00 PM  Bilal Lateef, MD:  Procedure days: Monday and Wednesday 7:30 AM to 4:00 PM  ______________________________________________________________________    ____________________________________________________________________________________________  General Risks and Possible Complications  Patient Responsibilities: It is important that you read this as it is part of your informed consent. It is our duty to inform you of the risks and possible complications associated with treatments offered to you. It is your responsibility as a patient to read this and to ask questions about anything that is not clear or that you believe was not covered in this document.  Patient's Rights: You have the right to refuse treatment. You also have the right to change your mind, even after initially having agreed to have the treatment done. However, under this last option, if you wait until the last second to change your mind, you may be charged for the materials used up to that point.    Introduction: Medicine is not an exact science. Everything in Medicine, including the lack of treatment(s), carries the potential for danger, harm, or loss (which is by definition: Risk). In Medicine, a complication is a secondary problem,  condition, or disease that can aggravate an already existing one. All treatments carry the risk of possible complications. The fact that a side effects or complications occurs, does not imply that the treatment was conducted incorrectly. It must be clearly understood that these can happen even when everything is done following the highest safety standards.  No treatment: You can choose not to proceed with the proposed treatment alternative. The "PRO(s)" would include: avoiding the risk of complications associated with the therapy. The "CON(s)" would include: not getting any of the treatment benefits. These benefits fall under one of three categories: diagnostic; therapeutic; and/or palliative. Diagnostic benefits include: getting information which can ultimately lead to improvement of the disease or symptom(s). Therapeutic benefits are those associated with the successful treatment of the disease. Finally, palliative benefits are those related to the decrease of the primary symptoms, without necessarily curing the condition (example: decreasing the pain from a flare-up of a chronic condition, such as incurable terminal cancer).  General Risks and Complications: These are associated to most interventional treatments. They can occur alone, or in combination. They fall under one of the following six (6) categories: no benefit or worsening of symptoms; bleeding; infection; nerve damage; allergic reactions; and/or death. No benefits or worsening of symptoms: In Medicine there are no guarantees, only probabilities. No healthcare provider can ever guarantee that a medical treatment will work, they can only state the probability that it may. Furthermore, there is always the possibility that the condition may worsen, either directly, or indirectly, as a consequence of the treatment. Bleeding: This is more common if the patient is taking a blood thinner, either prescription or over the counter (example: Goody Powders,  Fish oil, Aspirin, Garlic, etc.), or if suffering a condition associated with impaired coagulation (example: Hemophilia, cirrhosis of the liver, low platelet counts, etc.). However, even if you do not have one on these, it can still happen. If you have any of these conditions, or take one of these drugs, make sure to notify your treating physician. Infection: This is more common in patients with a compromised immune system, either due to disease (example: diabetes, cancer, human immunodeficiency virus [HIV], etc.), or due to medications or treatments (example: therapies used to treat cancer and rheumatological diseases). However, even if you do not have one on these, it can still happen. If you have any of these conditions, or take one of these drugs, make sure to notify your treating physician. Nerve Damage: This is more common when the treatment is an invasive one, but it can also happen with the use of medications, such as those used in the treatment of cancer. The damage can occur to small secondary nerves, or to large primary ones, such as those in the spinal cord and brain. This damage may be temporary or permanent and it may lead to impairments that can range from temporary numbness to permanent paralysis and/or brain death. Allergic Reactions: Any time a substance or material comes in contact with our body, there is the possibility of an allergic reaction. These can range from a mild skin rash (contact dermatitis) to a severe systemic reaction (anaphylactic reaction), which can result in death. Death: In general, any medical intervention can result in death, most of the time due to an unforeseen complication. ____________________________________________________________________________________________    

## 2022-08-12 NOTE — Telephone Encounter (Signed)
Pt called that she unable to come in for her appt due to her [employer]  only go to up to 3 litre oxygen and she need up to 5 litre she had concentrator  at home that go to 5 litre and her insurance denied for oxygen tanks due to  she just had [employer] advised her that we can do virtual visit and we can order to wheelchair

## 2022-08-14 NOTE — Telephone Encounter (Signed)
Done

## 2022-08-18 ENCOUNTER — Ambulatory Visit: Payer: Medicare Other | Admitting: Internal Medicine

## 2022-08-18 ENCOUNTER — Institutional Professional Consult (permissible substitution) (INDEPENDENT_AMBULATORY_CARE_PROVIDER_SITE_OTHER): Payer: Medicare Other | Admitting: Thoracic Surgery (Cardiothoracic Vascular Surgery)

## 2022-08-18 VITALS — BP 115/70 | HR 70 | Resp 20 | Ht <= 58 in | Wt 185.0 lb

## 2022-08-18 DIAGNOSIS — R911 Solitary pulmonary nodule: Secondary | ICD-10-CM | POA: Diagnosis not present

## 2022-08-18 NOTE — Progress Notes (Signed)
PCP is [name], MD Referring Provider is Allyne Gee, MD  Chief Complaint  Patient presents with   Lung Lesion    Surgical consult, Chest CT 06/02/22/ PET Scan 06/29/22/ Spirometry 02/17/22    HPI: [name] is sent for consultation regarding a right upper lobe lung nodule.  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  She smoked about a pack a day for 40 years.  She quit about 15 years ago.  She currently is oxygen dependent on 5 L nasal cannula.  Extremely limited in terms of activities.  Being evaluated for possible TAVR for prosthetic valve deterioration.  In September she had a CT which showed an increase in size of a right upper lobe lung nodule.  The nodule was new from 2021 and increased in size from March 2023.  It was a bilobed nodule that measured about 10 x 5 mm.  It had mild uptake on PET/CT with an SUV of 1.6.    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 s/p bioprosthetic valve replacement 2008 at [facility];  b. 01/2015 Echo: EF 60-65%, no rwma, Gr1 DD, mildly dil LA, nl RV fxn.   Aspiration pneumonia ([place])    Atrial fibrillation with RVR ([place]) 01/11/2015   Atrial flutter ([place])    a. 08/2016 s/p DCCV.  Remains on flecainide 50 mg bid.   Basal skull fracture ([name]) 20 yrs ago   CHF (congestive heart failure) ([name])    Chronic respiratory failure ([name])    COPD (chronic obstructive pulmonary disease) ([place])    a. 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 2008 prior to Aortic aneurysm repair-->nl cors.   History of stress test    a. 10/2015 MV: no ischemia/infarct.   HLD (hyperlipidemia)    HTN (hypertension)    Hypothyroidism    Obesity    PAF (paroxysmal atrial fibrillation) ([name])    a. on Eliquis; b. CHADS2VASc = 3 (HTN, age x 6, female).   Paroxysmal atrial fibrillation ([name]) 01/24/2015   Right upper quadrant abdominal tenderness without rebound tenderness 03/02/2018   S/P ascending aortic aneurysm repair 2008    Past Surgical History:  Procedure Laterality Date   ABDOMINAL AORTIC ANEURYSM REPAIR  2008   ABDOMINAL HYSTERECTOMY     AORTIC VALVE REPLACEMENT  2008   CARDIAC CATHETERIZATION     [facility]   CARDIOVERSION N/A 08/15/2018   Procedure: CARDIOVERSION;  Surgeon: [name], MD;  Location: [facility] ORS;  Service: Cardiovascular;  Laterality: N/A;   CARDIOVERSION N/A 11/14/2018   Procedure: CARDIOVERSION (CATH LAB);  Surgeon: [name], MD;  Location: [place] [facility];  Service: Cardiovascular;  Laterality: N/A;   CARDIOVERSION N/A 06/05/2019   Procedure: CARDIOVERSION;  Surgeon: [name], MD;  Location: [facility];  Service: Cardiovascular;  Laterality: N/A;   CARDIOVERSION N/A 08/14/2019   Procedure: CARDIOVERSION;  Surgeon: [name], MD;  Location: [facility];  Service: Cardiovascular;  Laterality: N/A;   CARDIOVERSION N/A  11/09/2019   Procedure: CARDIOVERSION;  Surgeon: Isaias Cowman, MD;  Location: [place] [facility];  Service: Cardiovascular;  Laterality: N/A;   CARDIOVERSION N/A 01/17/2020   Procedure: CARDIOVERSION;  Surgeon: Isaias Cowman, MD;  Location: [facility];  Service: Cardiovascular;  Laterality: N/A;   CARPAL TUNNEL RELEASE     ELECTROPHYSIOLOGIC STUDY N/A 08/17/2016   Procedure: Cardioversion;  Surgeon: [name], MD;  Location: [facility];  Service: Cardiovascular;  Laterality: N/A;   TUMOR EXCISION Left    x3 (arm)    Family History  Problem  Relation Age of Onset   Stroke Father    Stroke Paternal Grandmother     Social History Social History   Tobacco Use   Smoking status: Former    Types: Cigarettes   Smokeless tobacco: Never  Vaping Use   Vaping Use: Never used  Substance Use Topics   Alcohol use: No   Drug use: No    Current Outpatient Medications  Medication Sig Dispense Refill   ALPRAZolam (XANAX) 0.25 MG tablet TAKE 1 TABLET(0.25 MG) BY MOUTH TWICE DAILY AS NEEDED FOR ANXIETY (Patient taking differently: Take 0.25 mg by mouth 2 (two) times daily as needed for anxiety.) 60 tablet 1   apixaban (ELIQUIS) 5 MG TABS tablet Take 1 tablet (5 mg total) by mouth 2 (two) times daily. 180 tablet 2   atorvastatin (LIPITOR) 40 MG tablet TAKE 1 TABLET BY MOUTH EVERY NIGHT AT BEDTIME 90 tablet 1   Budeson-Glycopyrrol-Formoterol (BREZTRI AEROSPHERE) 160-9-4.8 MCG/ACT AERO Inhale 2 puffs into the lungs 2 (two) times daily. 10.7 g 11   bumetanide (BUMEX) 1 MG tablet Take 2 tablets (2 mg total) by mouth 2 (two) times daily. And additional 2 tablets PM PRN 360 tablet 3   Calcium Carbonate-Vitamin D (CALCIUM 600+D PO) Take 1 tablet by mouth daily.     carvedilol (COREG) 6.25 MG tablet Take 1 tablet (6.25 mg total) by mouth 2 (two) times daily with a meal. 60 tablet 3   Coenzyme Q10 (COQ10) 200 MG CAPS Take 200 mg by mouth daily.     diclofenac Sodium (VOLTAREN) 1 % GEL SMARTSIG:Gram(s) Topical Twice Daily     empagliflozin (JARDIANCE) 10 MG TABS tablet Take 1 tablet (10 mg total) by mouth daily before breakfast. 30 tablet 5   ferrous sulfate 325 (65 FE) MG tablet Take 1 tablet (325 mg total) by mouth daily with breakfast.  3   ipratropium-albuterol (DUONEB) 0.5-2.5 (3) MG/3ML SOLN Inhale 3 mLs into the lungs every 4 (four) hours as needed. Inhale 3 mls into the lungs every 4 to 6 hours and as needed 360 mL 3   levothyroxine (SYNTHROID) 25 MCG tablet TAKE 1 TABLET BY MOUTH DAILY BEFORE BREAKFAST 90 tablet 1   lisinopril (ZESTRIL) 40  MG tablet TAKE 1 TABLET(40 MG) BY MOUTH DAILY(DOSE INCREASE) 90 tablet 3   loratadine (CLARITIN) 10 MG tablet Take 10 mg by mouth daily.     ondansetron (ZOFRAN-ODT) 4 MG disintegrating tablet Take 1 tablet (4 mg total) by mouth every 8 (eight) hours as needed for nausea or vomiting. 20 tablet 0   oxyCODONE-acetaminophen (PERCOCET) 7.5-325 MG tablet Take one tab po bid for pain severe (Patient taking differently: Take 1 tablet by mouth every 12 (twelve) hours as needed for moderate pain.) 60 tablet 0   pantoprazole (PROTONIX) 40 MG tablet TAKE 1 TABLET BY MOUTH EVERY DAY 90 tablet 1   potassium chloride (KLOR-CON) 10 MEQ tablet Take 4 tablets (40 mEq total) by mouth  2 (two) times daily. 720 tablet 3   sertraline (ZOLOFT) 100 MG tablet Take one tab a day for Anxiety (Patient taking differently: Take 100 mg by mouth daily. Take one tab a day for Anxiety) 90 tablet 2   sodium chloride (OCEAN) 0.65 % SOLN nasal spray Place 1 spray into both nostrils as needed for congestion.     traZODone (DESYREL) 50 MG tablet Take 1 tablet (50 mg total) by mouth at bedtime. 90 tablet 1   VENTOLIN HFA 108 (90 Base) MCG/ACT inhaler INHALE 2 PUFFS INTO THE LUNGS EVERY 6 HOURS AS NEEDED FOR WHEEZING OR SHORTNESS OF BREATH 18 g 3   No current facility-administered medications for this visit.    Allergies  Allergen Reactions   Benadryl [Diphenhydramine Hcl (Sleep)] Palpitations   Cetirizine Palpitations   Lasix [Furosemide] Rash   Levaquin [Levofloxacin In D5w] Other (See Comments)    Reaction:  Fatigue and muscle soreness   Meloxicam Rash   Soy Allergy Hives and Nausea And Vomiting   Sulfa Antibiotics Rash    Review of Systems  Constitutional:  Positive for activity change and fatigue. Negative for unexpected weight change.  HENT:  Positive for voice change. Negative for trouble swallowing.   Respiratory:  Positive for apnea, chest tightness, shortness of breath and wheezing.   Cardiovascular:  Positive for  chest pain and leg swelling.  Gastrointestinal:  Positive for abdominal pain and blood in stool ([place] stools).  Musculoskeletal:  Positive for arthralgias, gait problem and joint swelling.  Neurological:  Positive for headaches.  Psychiatric/Behavioral:  Positive for dysphoric mood. The patient is nervous/anxious.   All other systems reviewed and are negative.   BP 115/70   Pulse 70   Resp 20   Ht 4\' 10"  (1.473 m)   Wt 185 lb (83.9 kg)   SpO2 94% Comment: 5L O2 per [facility]  BMI 38.67 kg/m  Physical Exam Vitals reviewed.  Constitutional:      Appearance: She is obese. She is ill-appearing.  HENT:     Head: Normocephalic and atraumatic.     Ears:     Comments: Hearing aid in place, extremely hard of hearing    Nose:     Comments: Nasal cannula oxygen Eyes:     General: No scleral icterus.    Extraocular Movements: Extraocular movements intact.  Cardiovascular:     Rate and Rhythm: Normal rate and regular rhythm.     Heart sounds: Murmur (3/6 systolic) heard.  Pulmonary:     Effort: No respiratory distress.     Breath sounds: No wheezing.     Comments: Markedly diminished air movement bilaterally Abdominal:     General: There is no distension.     Palpations: Abdomen is soft.  Musculoskeletal:     Cervical back: Neck supple.  Lymphadenopathy:     Cervical: No cervical adenopathy.  Skin:    General: Skin is warm and dry.  Neurological:     General: No focal deficit present.     Mental Status: She is alert and oriented to person, place, and time.     Cranial Nerves: No cranial nerve deficit.    Diagnostic Tests: CT CHEST WITHOUT CONTRAST   TECHNIQUE: Multidetector CT imaging of the chest was performed following the standard protocol without intravenous contrast. High resolution imaging of the lungs, as well as inspiratory and expiratory imaging, was performed.   RADIATION DOSE REDUCTION: This exam was performed according to the departmental dose-optimization  program which includes automated  exposure control, adjustment of the mA and/or kV according to patient size and/or use of iterative reconstruction technique.   COMPARISON:  01/01/2022, 11/14/2021 and 05/11/2020.   FINDINGS: Cardiovascular: Atherosclerotic calcification of the aorta and coronary arteries. Aortic valve replacement. Enlarged pulmonic trunk and heart. No pericardial effusion.   Mediastinum/Nodes: Low-attenuation lesions in the left thyroid measure up to 11 mm. No follow-up necessary. (Ref: J Am Coll Radiol. 2015 Feb;12(2): 143-50).Mediastinal lymph nodes are not enlarged by CT size criteria and appear unchanged. Hilar regions are difficult to definitively evaluate without IV contrast. Similar axillary lymph nodes. Surgical clips in the right subpectoral region. Esophagus is grossly unremarkable.   Lungs/Pleura: Centrilobular emphysema. Bilobed nodule in the apical segment right upper lobe measures approximately 0.5 by 1.1 cm (8/29), enlarged from 8 x 8 mm on 11/14/2021 and new from 05/11/2020. Basilar predominant parenchymal ground-glass, mild coarsening and septal thickening. There may be a few scattered subpleural reticular densities. No honeycombing. Mild traction bronchiectasis. Findings appear grossly similar to 11/14/2021 but new from 05/11/2020. Subpleural collapse/consolidation in the right lower lobe, likely due to scarring when compared with 05/11/2020. No pleural fluid. Airway is unremarkable. Expiratory phase imaging was not performed in true expiration, limiting the evaluation for air trapping.   Upper Abdomen: Visualized portion of the liver is unremarkable. Stones in the gallbladder. Visualized portions of the adrenal glands and right kidney are unremarkable. Scarring in the left kidney. Visualized portions of the spleen, pancreas, stomach and bowel are grossly unremarkable. No upper abdominal adenopathy.   Musculoskeletal: Degenerative changes in the  spine. No worrisome lytic or sclerotic lesions.   IMPRESSION: 1. Bilobed nodule in the apical segment right upper lobe, enlarged from 11/14/2021 and new from 05/11/2020, worrisome for primary bronchogenic carcinoma. 2. Basilar pulmonary fibrosis, as described above, may be due to fibrotic nonspecific interstitial pneumonitis or possibly fibrotic hypersensitivity pneumonitis. Usual interstitial pneumonitis is considered less likely. Findings are suggestive of an alternative diagnosis (not UIP) per consensus guidelines: Diagnosis of Idiopathic Pulmonary Fibrosis: An Official ATS/ERS/JRS/ALAT Clinical Practice Guideline. [place], Iss 5, [phone], May 08 2017. 3. Cholelithiasis. 4. Aortic atherosclerosis (ICD10-I70.0). Coronary artery calcification. 5. Enlarged pulmonic trunk, indicative of pulmonary arterial hypertension. 6.  Emphysema (ICD10-J43.9).     Electronically Signed   By: Lorin Picket M.D.   On: 06/03/2022 15:58 NUCLEAR MEDICINE PET SKULL BASE TO THIGH   TECHNIQUE: 10.9 mCi F-18 FDG was injected intravenously. Full-ring PET imaging was performed from the skull base to thigh after the radiotracer. CT data was obtained and used for attenuation correction and anatomic localization.   Fasting blood glucose: 102 mg/dl   COMPARISON:  CT chest 06/02/2022, 01/01/2022, 10/17/2021 and 05/11/2020.   FINDINGS: Mediastinal blood pool activity: SUV max 2.2   Liver activity: SUV max NA   NECK:   No abnormal hypermetabolism.   Incidental CT findings:   None.   CHEST:   8 mm apical segment right upper lobe nodule (4 x 12 mm, 2/64), SUV max 1.6. Nodule is minimally increased from 8 mm on 11/14/2021 and is new from 05/11/2020. Subpleural ground-glass and consolidation in the right middle and right lower lobes, slightly increased from 06/02/2022, with expected hypermetabolism. No hypermetabolic lymph nodes. Hypermetabolism associated with  surgical clips in the right subpectoral region. No soft tissue CT correlate.   Incidental CT findings:   Atherosclerotic calcification of the aorta. Aortic valve replacement. Heart is enlarged. No pericardial or pleural effusion. Centrilobular emphysema. Dependent atelectasis in  the lingula and left lower lobe.   ABDOMEN/PELVIS:   No abnormal hypermetabolism in the liver, adrenal glands, spleen or pancreas. No hypermetabolic lymph nodes.   Incidental CT findings:   Liver margin is irregular. Stones in the gallbladder. Adrenal glands are unremarkable. Probable renal vascular calcifications on the right. Kidneys, spleen, pancreas stomach and bowel are grossly unremarkable. Atherosclerotic calcification of the aorta. Probable Bartholin cyst on the right.   SKELETON:   No abnormal hypermetabolism   Incidental CT findings:   Degenerative changes in the spine.   IMPRESSION: 1. Apical segment right upper lobe nodule is borderline in size for PET resolution but is visualized, is minimally larger than on 11/14/2021 and is new from 05/11/2020. 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 2. Right basilar subpleural ground-glass and consolidation, minimally increased and associated hypermetabolism. Findings may be due to atelectasis superimposed on interstitial lung disease, better evaluated on 06/02/2022. 3. Cirrhosis. 4. Cholelithiasis. 5.  Aortic atherosclerosis (ICD10-I70.0). 6.  Emphysema (ICD10-J43.9).     Electronically Signed   By: Lorin Picket M.D.   On: 06/30/2022 11:31   I personally reviewed the CT and PET/CT images.  There is a bilobed right upper lobe nodule that has mild activity on PET.  Extensive interstitial changes with some peripheral honeycombing.  Dilated central pulmonary artery.  Thoracic aortic atherosclerosis.  Coronary atherosclerosis.  Emphysema.  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  Right upper lobe lung nodule-increase in size from March to September 2023.  Mild uptake on PET but a relatively small lesion so in the uptake is significant.  Given her age, appearance of the lesion, and smoking history, this nodule is most likely a new primary bronchogenic carcinoma.  She is not a candidate for surgical resection.  She has multiple severe medical problems including prosthetic aortic valve deterioration, chronic diastolic heart failure, pulmonary hypertension, and oxygen dependent respiratory failure requiring 5 L of nasal cannula at rest.  Surprisingly her FEV1 was 1.15 or 72% of predicted.  I do not think she would be a good candidate for general anesthesia for a navigational bronchoscopy.  I think the lesion is too small to try a CT-guided biopsy.  In my opinion the best option would be to treat her empirically with stereotactic radiation.  Hopefully, radiation oncology would agree to that.  She does wish to have a referral.  Plan: Refer to radiation oncology at [place] with Dr. Yancey Flemings I will be happy to see [name] back if I can be of any assistance with her care.  I spent over 35 minutes in review of records, images, and in consultation with [name] today. Melrose Nakayama, MD [facility] [phone]

## 2022-08-20 ENCOUNTER — Encounter: Payer: Self-pay | Admitting: Nurse Practitioner

## 2022-08-20 ENCOUNTER — Ambulatory Visit (INDEPENDENT_AMBULATORY_CARE_PROVIDER_SITE_OTHER): Payer: Medicare Other | Admitting: Nurse Practitioner

## 2022-08-20 VITALS — BP 106/51 | HR 70 | Temp 98.3°F | Resp 16 | Ht 60.0 in | Wt 188.0 lb

## 2022-08-20 DIAGNOSIS — R300 Dysuria: Secondary | ICD-10-CM | POA: Diagnosis not present

## 2022-08-20 DIAGNOSIS — Z0001 Encounter for general adult medical examination with abnormal findings: Secondary | ICD-10-CM | POA: Diagnosis not present

## 2022-08-20 DIAGNOSIS — J9611 Chronic respiratory failure with hypoxia: Secondary | ICD-10-CM

## 2022-08-20 DIAGNOSIS — I5032 Chronic diastolic (congestive) heart failure: Secondary | ICD-10-CM | POA: Diagnosis not present

## 2022-08-20 NOTE — Progress Notes (Signed)
[facility] [place], [place] [address]  Internal MEDICINE  Office Visit Note  Patient Name: [name]  [id number]  [id number]  Date of Service: 08/20/2022  Chief Complaint  Patient presents with   Medicare Wellness   Hyperlipidemia   Hypertension   Quality Metric Gaps    TDAP, Shingles, Pneumonia and a Mammogram    HPI [name] presents for an annual well visit and physical exam.  Well-appearing 76 y.o. female/female with AFIB, COPD, CHF, chronic respiratory failure, oxygen dependent, IBS, osteoarthritis, hypothyroidism, anemia, GAD, and depression Routine CRC screening: repeat in 2029 Routine mammogram: declined DEXA scan: done in 2017 Labs: deferred New or worsening pain: none Other concerns: waiting to get new portable oxygen unit, working on this with [employer]      08/20/2022    2:41 PM 08/20/2022    2:06 PM 08/20/2022    2:05 PM  MMSE - Mini Mental State Exam  Not completed:  Unable to complete Unable to complete  Orientation to time 5    Orientation to Place 5    Registration 3    Attention/ Calculation 5    Recall 3    Language- name 2 objects 2    Language- repeat 0    Language- follow 3 step command 2    Language- read & follow direction 1    Write a sentence 0    Copy design 1    Total score 27      Functional Status Survey: Is the patient deaf or have difficulty hearing?: Yes Does the patient have difficulty seeing, even when wearing glasses/contacts?: No Does the patient have difficulty concentrating, remembering, or making decisions?: No Does the patient have difficulty walking or climbing stairs?: Yes Does the patient have difficulty dressing or bathing?: No Does the patient have difficulty doing errands alone such as visiting a doctor's office or shopping?: No     08/06/2022    1:00 AM 08/06/2022    8:00 AM 08/06/2022    8:00 PM 08/07/2022    7:10 AM 08/20/2022    2:04 PM  [place] in the past year?     1   Was there an injury with Fall?     0  Fall Risk Category Calculator     2  Fall Risk Category     Moderate  Patient Fall Risk Level Moderate fall risk Moderate fall risk Moderate fall risk Moderate fall risk        08/20/2022    2:04 PM  Depression screen PHQ 2/9  Decreased Interest 0  Down, Depressed, Hopeless 0  PHQ - 2 Score 0       05/05/2022    2:11 PM 04/06/2022    1:53 PM 10/27/2021   11:43 AM 05/22/2021    1:50 PM  GAD 7 : Generalized Anxiety Score  Nervous, Anxious, on Edge 0 0 0 1  Control/stop worrying 0 0 0 1  Worry too much - different things 0 0 0 1  Trouble relaxing 0 0 0 1  Restless 0 0 0 1  Easily annoyed or irritable 0 0 0 1  Afraid - awful might happen 0 0 0 1  Total GAD 7 Score 0 0 0 7  Anxiety Difficulty Not difficult at all Not difficult at all Not difficult at all Somewhat difficult      Current Medication: Outpatient Encounter Medications as of 08/20/2022  Medication Sig Note   ALPRAZolam (XANAX) 0.25  MG tablet TAKE 1 TABLET(0.25 MG) BY MOUTH TWICE DAILY AS NEEDED FOR ANXIETY (Patient taking differently: Take 0.25 mg by mouth 2 (two) times daily as needed for anxiety.)    apixaban (ELIQUIS) 5 MG TABS tablet Take 1 tablet (5 mg total) by mouth 2 (two) times daily.    atorvastatin (LIPITOR) 40 MG tablet TAKE 1 TABLET BY MOUTH EVERY NIGHT AT BEDTIME    Budeson-Glycopyrrol-Formoterol (BREZTRI AEROSPHERE) 160-9-4.8 MCG/ACT AERO Inhale 2 puffs into the lungs 2 (two) times daily.    bumetanide (BUMEX) 1 MG tablet Take 2 tablets (2 mg total) by mouth 2 (two) times daily. And additional 2 tablets PM PRN    Calcium Carbonate-Vitamin D (CALCIUM 600+D PO) Take 1 tablet by mouth daily.    carvedilol (COREG) 6.25 MG tablet Take 1 tablet (6.25 mg total) by mouth 2 (two) times daily with a meal.    Coenzyme Q10 (COQ10) 200 MG CAPS Take 200 mg by mouth daily.    diclofenac Sodium (VOLTAREN) 1 % GEL SMARTSIG:Gram(s) Topical Twice Daily    empagliflozin (JARDIANCE)  10 MG TABS tablet Take 1 tablet (10 mg total) by mouth daily before breakfast.    ferrous sulfate 325 (65 FE) MG tablet Take 1 tablet (325 mg total) by mouth daily with breakfast.    ipratropium-albuterol (DUONEB) 0.5-2.5 (3) MG/3ML SOLN Inhale 3 mLs into the lungs every 4 (four) hours as needed. Inhale 3 mls into the lungs every 4 to 6 hours and as needed    levothyroxine (SYNTHROID) 25 MCG tablet TAKE 1 TABLET BY MOUTH DAILY BEFORE BREAKFAST    lisinopril (ZESTRIL) 40 MG tablet TAKE 1 TABLET(40 MG) BY MOUTH DAILY(DOSE INCREASE)    loratadine (CLARITIN) 10 MG tablet Take 10 mg by mouth daily.    ondansetron (ZOFRAN-ODT) 4 MG disintegrating tablet Take 1 tablet (4 mg total) by mouth every 8 (eight) hours as needed for nausea or vomiting.    oxyCODONE-acetaminophen (PERCOCET) 7.5-325 MG tablet Take one tab po bid for pain severe (Patient taking differently: Take 1 tablet by mouth every 12 (twelve) hours as needed for moderate pain.) 08/04/2022: Patient takes one tablet every morning and one-half tablet in afternoon and bedtime   pantoprazole (PROTONIX) 40 MG tablet TAKE 1 TABLET BY MOUTH EVERY DAY    potassium chloride (KLOR-CON) 10 MEQ tablet Take 4 tablets (40 mEq total) by mouth 2 (two) times daily.    sertraline (ZOLOFT) 100 MG tablet Take one tab a day for Anxiety (Patient taking differently: Take 100 mg by mouth daily. Take one tab a day for Anxiety)    sodium chloride (OCEAN) 0.65 % SOLN nasal spray Place 1 spray into both nostrils as needed for congestion.    traZODone (DESYREL) 50 MG tablet Take 1 tablet (50 mg total) by mouth at bedtime.    VENTOLIN HFA 108 (90 Base) MCG/ACT inhaler INHALE 2 PUFFS INTO THE LUNGS EVERY 6 HOURS AS NEEDED FOR WHEEZING OR SHORTNESS OF BREATH    No facility-administered encounter medications on file as of 08/20/2022.    Surgical History: Past Surgical History:  Procedure Laterality Date   ABDOMINAL AORTIC ANEURYSM REPAIR  2008   ABDOMINAL HYSTERECTOMY      AORTIC VALVE REPLACEMENT  2008   CARDIAC CATHETERIZATION     [place]   CARDIOVERSION N/A 08/15/2018   Procedure: CARDIOVERSION;  Surgeon: [name], MD;  Location: [facility] OR[facility];  Service: Cardiovascular;  Laterality: N/A;   CARDIOVERSION N/A 11/14/2018   Procedure: CARDIOVERSION (CATH LAB);  Surgeon: [name], MD;  Location: [facility];  Service: Cardiovascular;  Laterality: N/A;   CARDIOVERSION N/A 06/05/2019   Procedure: CARDIOVERSION;  Surgeon: [name], MD;  Location: [facility];  Service: Cardiovascular;  Laterality: N/A;   CARDIOVERSION N/A 08/14/2019   Procedure: CARDIOVERSION;  Surgeon: [name], MD;  Location: [facility];  Service: Cardiovascular;  Laterality: N/A;   CARDIOVERSION N/A 11/09/2019   Procedure: CARDIOVERSION;  Surgeon: Isaias Cowman, MD;  Location: [facility];  Service: Cardiovascular;  Laterality: N/A;   CARDIOVERSION N/A 01/17/2020   Procedure: CARDIOVERSION;  Surgeon: Isaias Cowman, MD;  Location: [facility];  Service: Cardiovascular;  Laterality: N/A;   CARPAL TUNNEL RELEASE     ELECTROPHYSIOLOGIC STUDY N/A 08/17/2016   Procedure: Cardioversion;  Surgeon: [name], MD;  Location: [facility];  Service: Cardiovascular;  Laterality: N/A;   TUMOR EXCISION Left    x3 (arm)    Medical History: Past Medical History:  Diagnosis Date   Aortic 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 s/p bioprosthetic valve replacement 2008 at [facility];  b. 01/2015 Echo: EF 60-65%, no rwma, Gr1 DD, mildly dil LA, nl RV fxn.   Aspiration pneumonia ([place])    Atrial fibrillation with RVR ([place]) 01/11/2015   Atrial flutter ([place])    a. 08/2016 s/p DCCV.  Remains on flecainide 50 mg bid.   Basal skull fracture ([name]) 20 yrs ago   CHF (congestive heart failure) ([name])    Chronic respiratory failure ([name])    COPD (chronic obstructive pulmonary disease) ([place])    a. 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 2008 prior to Aortic aneurysm repair-->nl cors.   History of stress test    a. 10/2015 MV: no ischemia/infarct.   HLD (hyperlipidemia)    HTN (hypertension)    Hypothyroidism    Obesity    PAF (paroxysmal atrial fibrillation) ([name])    a. on Eliquis; b. CHADS2VASc = 3 (HTN, age x 22, female).   Paroxysmal atrial fibrillation ([name]) 01/24/2015   Right upper quadrant abdominal tenderness without rebound tenderness 03/02/2018   S/P ascending aortic aneurysm repair 2008    Family History: Family History  Problem Relation Age of Onset   Stroke Father    Stroke Paternal Grandmother     Social History   Socioeconomic History   Marital status: Single    Spouse name: Not on file   Number of children: Not on file   Years of education: Not on file   Highest education level: Not on file  Occupational History   Not on file  Tobacco Use   Smoking status: Former    Types: Cigarettes   Smokeless tobacco: Never  Vaping Use   Vaping Use: Never used  Substance and Sexual Activity   Alcohol use: No   Drug use: No   Sexual activity: Never    Birth control/protection: Surgical  Other Topics Concern   Not on file  Social History Narrative   Not on file   Social Determinants of Health   Financial Resource Strain: Low Risk  (08/04/2021)   Overall Financial Resource Strain (CARDIA)    Difficulty of Paying Living Expenses: Not very hard  Food Insecurity: No Food Insecurity (08/05/2022)   Hunger Vital Sign    Worried About Running Out of  Food in the Last Year: Never true    [place] in the Last Year: Never true  Transportation Needs: No Transportation Needs (08/05/2022)   PRAPARE - Hydrologist (Medical): No    Lack of Transportation (Non-Medical): No  Physical Activity: Not on file  Stress: Not on file  Social Connections: Not on file  Intimate Partner Violence: Not At Risk (08/05/2022)    Humiliation, Afraid, Rape, and Kick questionnaire    Fear of Current or Ex-Partner: No    Emotionally Abused: No    Physically Abused: No    Sexually Abused: No      Review of Systems  Constitutional:  Negative for activity change, appetite change, chills, fatigue, fever and unexpected weight change.  HENT: Negative.  Negative for congestion, ear pain, rhinorrhea, sore throat and trouble swallowing.   Eyes: Negative.   Respiratory: Negative.  Negative for cough, chest tightness, shortness of breath and wheezing.   Cardiovascular: Negative.  Negative for chest pain.  Gastrointestinal: Negative.  Negative for abdominal pain, blood in stool, constipation, diarrhea, nausea and vomiting.  Endocrine: Negative.   Genitourinary: Negative.  Negative for difficulty urinating, dysuria, frequency, hematuria and urgency.  Musculoskeletal: Negative.  Negative for arthralgias, back pain, joint swelling, myalgias and neck pain.  Skin: Negative.  Negative for rash and wound.  Allergic/Immunologic: Negative.  Negative for immunocompromised state.  Neurological:  Positive for headaches. Negative for dizziness, seizures and numbness.  Hematological: Negative.   Psychiatric/Behavioral: Negative.  Negative for behavioral problems, self-injury and suicidal ideas. The patient is not nervous/anxious.     Vital Signs: BP (!) 106/51   Pulse 70   Temp 98.3 F (36.8 C)   Resp 16   Ht 5' (1.524 m)   Wt 188 lb (85.3 kg)   SpO2 95%   BMI 36.72 kg/m    Physical Exam Vitals reviewed.  Constitutional:      General: She is awake. She is not in acute distress.    Appearance: Normal appearance. She is well-developed. She is obese. She is not ill-appearing or diaphoretic.  HENT:     Head: Normocephalic and atraumatic.     Right Ear: Tympanic membrane, ear canal and external ear normal.     Left Ear: Tympanic membrane, ear canal and external ear normal.     Nose: Nose normal. No congestion or rhinorrhea.      Mouth/Throat:     Lips: Pink.     Mouth: Mucous membranes are moist.     Pharynx: Oropharynx is clear. Uvula midline. No oropharyngeal exudate or posterior oropharyngeal erythema.  Eyes:     General: Lids are normal. Vision grossly intact. Gaze aligned appropriately. No scleral icterus.       Right eye: No discharge.        Left eye: No discharge.     Extraocular Movements: Extraocular movements intact.     Conjunctiva/sclera: Conjunctivae normal.     Pupils: Pupils are equal, round, and reactive to light.     Funduscopic exam:    Right eye: Red reflex present.        Left eye: Red reflex present. Neck:     Thyroid: No thyromegaly.     Vascular: No JVD.     Trachea: No tracheal deviation.  Cardiovascular:     Rate and Rhythm: Normal rate and regular rhythm.     Pulses: Normal pulses.     Heart sounds: Normal heart sounds, S1 normal and  S2 normal. No murmur heard.    No friction rub. No gallop.  Pulmonary:     Effort: Pulmonary effort is normal. No accessory muscle usage or respiratory distress.     Breath sounds: Normal breath sounds and air entry. No stridor. No wheezing or rales.  Chest:     Chest wall: No tenderness.     Comments: Declined clinical breast exam.  Abdominal:     General: Bowel sounds are normal. There is no distension.     Palpations: Abdomen is soft. There is no shifting dullness, fluid wave, mass or pulsatile mass.     Tenderness: There is no abdominal tenderness. There is no guarding or rebound.  Musculoskeletal:        General: No tenderness or deformity. Normal range of motion.     Cervical back: Normal range of motion and neck supple.  Lymphadenopathy:     Cervical: No cervical adenopathy.  Skin:    General: Skin is warm and dry.     Capillary Refill: Capillary refill takes less than 2 seconds.     Coloration: Skin is not pale.     Findings: No erythema or rash.  Neurological:     Mental Status: She is alert and oriented to person, place, and time.      Cranial Nerves: No cranial nerve deficit.     Motor: No abnormal muscle tone.     Coordination: Coordination normal.     Gait: Gait normal.     Deep Tendon Reflexes: Reflexes are normal and symmetric.  Psychiatric:        Mood and Affect: Mood normal.        Behavior: Behavior normal. Behavior is cooperative.        Thought Content: Thought content normal.        Judgment: Judgment normal.        Assessment/Plan: 1. Encounter for general adult medical examination with abnormal findings Age-appropriate preventive screenings and vaccinations discussed, annual physical exam completed. Routine labs for health maintenance deferred. PHM updated. Declined routine mammogram for now.   2. Chronic respiratory failure with hypoxia ([name]) Oxygen dependent,on 5LPM continuous. Needs a better portable unit that goes to a higher level.   3. Chronic diastolic CHF (congestive heart failure) ([place]) Followed by cardiology and the heart failure clinic.   4. Dysuria Routine urinalysis done  - UA/M w/rflx Culture, Routine      General Counseling: [name] verbalizes understanding of the findings of todays visit and agrees with plan of treatment. I have discussed any further diagnostic evaluation that may be needed or ordered today. We also reviewed her medications today. she has been encouraged to call the office with any questions or concerns that should arise related to todays visit.    Orders Placed This Encounter  Procedures   UA/M w/rflx Culture, Routine    No orders of the defined types were placed in this encounter.   Return in about 8 weeks (around 10/14/2022) for F/U, Iliya Spivack PCP.   Total time spent:30 Minutes Time spent includes review of chart, medications, test results, and follow up plan with the patient.   [place] Controlled Substance Database was reviewed by me.  This patient was seen by Jonetta Osgood, FNP-C in collaboration with Dr. Clayborn Bigness as a part of collaborative care  agreement.  Shalinda Burkholder R. Valetta Fuller, MSN, FNP-C Internal medicine

## 2022-08-21 LAB — UA/M W/RFLX CULTURE, ROUTINE
Bilirubin, UA: NEGATIVE
Ketones, UA: NEGATIVE
Leukocytes,UA: NEGATIVE
Nitrite, UA: NEGATIVE
Protein,UA: NEGATIVE
RBC, UA: NEGATIVE
Specific Gravity, UA: 1.013 (ref 1.005–1.030)
Urobilinogen, Ur: 0.2 mg/dL (ref 0.2–1.0)
pH, UA: 6.5 (ref 5.0–7.5)

## 2022-08-21 LAB — MICROSCOPIC EXAMINATION
Bacteria, UA: NONE SEEN
Casts: NONE SEEN /lpf
Epithelial Cells (non renal): NONE SEEN /hpf (ref 0–10)
RBC, Urine: NONE SEEN /hpf (ref 0–2)
WBC, UA: NONE SEEN /hpf (ref 0–5)

## 2022-08-28 ENCOUNTER — Other Ambulatory Visit: Payer: Self-pay | Admitting: Internal Medicine

## 2022-08-28 DIAGNOSIS — I480 Paroxysmal atrial fibrillation: Secondary | ICD-10-CM

## 2022-09-01 ENCOUNTER — Ambulatory Visit
Admission: RE | Admit: 2022-09-01 | Discharge: 2022-09-01 | Disposition: A | Discharge status: Home / Self Care | Payer: Medicare Other | Source: Ambulatory Visit | Attending: Radiation Oncology | Admitting: Radiation Oncology

## 2022-09-01 ENCOUNTER — Encounter: Payer: Self-pay | Admitting: Radiation Oncology

## 2022-09-01 VITALS — BP 108/55 | HR 72 | Temp 96.3°F

## 2022-09-01 DIAGNOSIS — R918 Other nonspecific abnormal finding of lung field: Secondary | ICD-10-CM | POA: Insufficient documentation

## 2022-09-01 DIAGNOSIS — Z79899 Other long term (current) drug therapy: Secondary | ICD-10-CM | POA: Insufficient documentation

## 2022-09-01 DIAGNOSIS — Z87891 Personal history of nicotine dependence: Secondary | ICD-10-CM | POA: Insufficient documentation

## 2022-09-01 DIAGNOSIS — E039 Hypothyroidism, unspecified: Secondary | ICD-10-CM | POA: Diagnosis not present

## 2022-09-01 DIAGNOSIS — E785 Hyperlipidemia, unspecified: Secondary | ICD-10-CM | POA: Insufficient documentation

## 2022-09-01 DIAGNOSIS — E669 Obesity, unspecified: Secondary | ICD-10-CM | POA: Diagnosis not present

## 2022-09-01 DIAGNOSIS — I480 Paroxysmal atrial fibrillation: Secondary | ICD-10-CM | POA: Insufficient documentation

## 2022-09-01 DIAGNOSIS — C3411 Malignant neoplasm of upper lobe, right bronchus or lung: Secondary | ICD-10-CM | POA: Diagnosis not present

## 2022-09-01 DIAGNOSIS — I110 Hypertensive heart disease with heart failure: Secondary | ICD-10-CM | POA: Diagnosis not present

## 2022-09-01 DIAGNOSIS — I5032 Chronic diastolic (congestive) heart failure: Secondary | ICD-10-CM | POA: Diagnosis not present

## 2022-09-01 DIAGNOSIS — I272 Pulmonary hypertension, unspecified: Secondary | ICD-10-CM | POA: Insufficient documentation

## 2022-09-01 DIAGNOSIS — Z7901 Long term (current) use of anticoagulants: Secondary | ICD-10-CM | POA: Diagnosis not present

## 2022-09-01 DIAGNOSIS — Z791 Long term (current) use of non-steroidal anti-inflammatories (NSAID): Secondary | ICD-10-CM | POA: Diagnosis not present

## 2022-09-01 DIAGNOSIS — I350 Nonrheumatic aortic (valve) stenosis: Secondary | ICD-10-CM | POA: Insufficient documentation

## 2022-09-01 DIAGNOSIS — J449 Chronic obstructive pulmonary disease, unspecified: Secondary | ICD-10-CM | POA: Insufficient documentation

## 2022-09-01 DIAGNOSIS — G4733 Obstructive sleep apnea (adult) (pediatric): Secondary | ICD-10-CM | POA: Diagnosis not present

## 2022-09-01 NOTE — Consult Note (Signed)
NEW PATIENT EVALUATION  Name: [name]  MRN: [id number]  Date:   09/01/2022     DOB: 09-Apr-1946   This 76 y.o. 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  REFERRING PHYSICIAN: [name], MD  CHIEF COMPLAINT:  Chief Complaint  Patient presents with   Lung Lesion    Consult    DIAGNOSIS: The encounter diagnosis was Lung mass.   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  They have been tracking a new right upper lobe lesion which has been increasing over time.  PET scan demonstrated slight hypermetabolic activity for its size compatible with a stage Ia primary bronchogenic carcinoma of the right upper lobe.  She is seen today for evaluation.  She is having no hemoptysis or significant cough at this time she is on 5 L of nasal oxygen.  PLANNED TREATMENT REGIMEN: SBRT  PAST MEDICAL HISTORY:  has a past medical history of Aortic stenosis due to bicuspid aortic valve, Aspiration pneumonia ([name]), Atrial fibrillation with RVR ([place]) (01/11/2015), Atrial flutter ([place]), Basal skull fracture ([name]) (20 yrs ago), CHF (congestive heart failure) ([place]), Chronic respiratory failure ([place]), COPD (chronic obstructive pulmonary disease) ([place]), Deafness in left ear, Essential hypertension (01/24/2015), History of cardiac cath, History of stress test, HLD (hyperlipidemia), HTN (hypertension), Hypothyroidism, Obesity, PAF (paroxysmal atrial fibrillation) ([place]), Paroxysmal atrial fibrillation ([place]) (01/24/2015), Right upper quadrant abdominal tenderness without rebound tenderness (03/02/2018), and S/P  ascending aortic aneurysm repair (2008).    PAST SURGICAL HISTORY:  Past Surgical History:  Procedure Laterality Date   ABDOMINAL AORTIC ANEURYSM REPAIR  2008   ABDOMINAL HYSTERECTOMY     AORTIC VALVE REPLACEMENT  2008   CARDIAC CATHETERIZATION     [place]   CARDIOVERSION N/A 08/15/2018   Procedure: CARDIOVERSION;  Surgeon: [name], MD;  Location: [facility];  Service: Cardiovascular;  Laterality: N/A;   CARDIOVERSION N/A 11/14/2018   Procedure: CARDIOVERSION (CATH LAB);  Surgeon: [name], MD;  Location: [facility];  Service: Cardiovascular;  Laterality: N/A;   CARDIOVERSION N/A 06/05/2019   Procedure: CARDIOVERSION;  Surgeon: [name], MD;  Location: [facility];  Service: Cardiovascular;  Laterality: N/A;   CARDIOVERSION N/A 08/14/2019   Procedure: CARDIOVERSION;  Surgeon: [name], MD;  Location: [facility];  Service: Cardiovascular;  Laterality: N/A;   CARDIOVERSION N/A 11/09/2019   Procedure: CARDIOVERSION;  Surgeon: Isaias Cowman, MD;  Location: [facility];  Service: Cardiovascular;  Laterality: N/A;   CARDIOVERSION N/A 01/17/2020   Procedure: CARDIOVERSION;  Surgeon: Isaias Cowman, MD;  Location: [facility];  Service: Cardiovascular;  Laterality: N/A;   CARPAL TUNNEL RELEASE     ELECTROPHYSIOLOGIC STUDY N/A 08/17/2016   Procedure: Cardioversion;  Surgeon: [name], MD;  Location: [facility];  Service: Cardiovascular;  Laterality: N/A;   TUMOR EXCISION Left    x3 (arm)    FAMILY HISTORY: family history includes Stroke in her father and paternal grandmother.  SOCIAL HISTORY:  reports that she has quit smoking. Her smoking use included cigarettes. She has never used smokeless tobacco. She reports that she does not drink alcohol and does not use drugs.  ALLERGIES: Benadryl [diphenhydramine hcl (sleep)], Cetirizine,  Lasix [furosemide], Levaquin [levofloxacin in d5w], Meloxicam, Soy allergy, and Sulfa antibiotics  MEDICATIONS:  Current Outpatient  Medications  Medication Sig Dispense Refill   ALPRAZolam (XANAX) 0.25 MG tablet TAKE 1 TABLET(0.25 MG) BY MOUTH TWICE DAILY AS NEEDED FOR ANXIETY (Patient taking differently: Take 0.25 mg by mouth 2 (two) times daily as needed for anxiety.) 60 tablet 1   Budeson-Glycopyrrol-Formoterol (BREZTRI AEROSPHERE) 160-9-4.8 MCG/ACT AERO Inhale 2 puffs into the lungs 2 (two) times daily. 10.7 g 11   bumetanide (BUMEX) 1 MG tablet Take 2 tablets (2 mg total) by mouth 2 (two) times daily. And additional 2 tablets PM PRN 360 tablet 3   Calcium Carbonate-Vitamin D (CALCIUM 600+D PO) Take 1 tablet by mouth daily.     ELIQUIS 5 MG TABS tablet TAKE 1 TABLET BY MOUTH TWICE DAILY 180 tablet 2   atorvastatin (LIPITOR) 40 MG tablet TAKE 1 TABLET BY MOUTH EVERY NIGHT AT BEDTIME 90 tablet 1   carvedilol (COREG) 6.25 MG tablet Take 1 tablet (6.25 mg total) by mouth 2 (two) times daily with a meal. 60 tablet 3   Coenzyme Q10 (COQ10) 200 MG CAPS Take 200 mg by mouth daily.     diclofenac Sodium (VOLTAREN) 1 % GEL SMARTSIG:Gram(s) Topical Twice Daily     empagliflozin (JARDIANCE) 10 MG TABS tablet Take 1 tablet (10 mg total) by mouth daily before breakfast. 30 tablet 5   ferrous sulfate 325 (65 FE) MG tablet Take 1 tablet (325 mg total) by mouth daily with breakfast.  3   ipratropium-albuterol (DUONEB) 0.5-2.5 (3) MG/3ML SOLN Inhale 3 mLs into the lungs every 4 (four) hours as needed. Inhale 3 mls into the lungs every 4 to 6 hours and as needed 360 mL 3   levothyroxine (SYNTHROID) 25 MCG tablet TAKE 1 TABLET BY MOUTH DAILY BEFORE BREAKFAST 90 tablet 1   lisinopril (ZESTRIL) 40 MG tablet TAKE 1 TABLET(40 MG) BY MOUTH DAILY(DOSE INCREASE) 90 tablet 3   loratadine (CLARITIN) 10 MG tablet Take 10 mg by mouth daily.     ondansetron (ZOFRAN-ODT) 4 MG disintegrating tablet Take 1 tablet (4 mg total) by mouth every 8 (eight) hours as needed for nausea or vomiting. 20 tablet 0   oxyCODONE-acetaminophen (PERCOCET) 7.5-325 MG tablet  Take one tab po bid for pain severe (Patient taking differently: Take 1 tablet by mouth every 12 (twelve) hours as needed for moderate pain.) 60 tablet 0   pantoprazole (PROTONIX) 40 MG tablet TAKE 1 TABLET BY MOUTH EVERY DAY 90 tablet 1   potassium chloride (KLOR-CON) 10 MEQ tablet Take 4 tablets (40 mEq total) by mouth 2 (two) times daily. 720 tablet 3   sertraline (ZOLOFT) 100 MG tablet Take one tab a day for Anxiety (Patient taking differently: Take 100 mg by mouth daily. Take one tab a day for Anxiety) 90 tablet 2   sodium chloride (OCEAN) 0.65 % SOLN nasal spray Place 1 spray into both nostrils as needed for congestion.     traZODone (DESYREL) 50 MG tablet Take 1 tablet (50 mg total) by mouth at bedtime. 90 tablet 1   VENTOLIN HFA 108 (90 Base) MCG/ACT inhaler INHALE 2 PUFFS INTO THE LUNGS EVERY 6 HOURS AS NEEDED FOR WHEEZING OR SHORTNESS OF BREATH 18 g 3   No current facility-administered medications for this encounter.    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  Well-developed well-nourished patient in NAD. HEENT reveals PERLA, EOMI, discs not visualized.  Oral cavity is clear. No oral mucosal lesions are identified. Neck is clear without evidence of cervical or supraclavicular adenopathy. Lungs are clear to A&P. Cardiac examination is essentially unremarkable with regular rate and rhythm without murmur rub or thrill. Abdomen is benign with no organomegaly or masses  noted. Motor sensory and DTR levels are equal and symmetric in the upper and lower extremities. Cranial nerves II through XII are grossly intact. Proprioception is intact. No peripheral adenopathy or edema is identified. No motor or sensory levels are noted. Crude visual fields are within normal range.  LABORATORY DATA: Labs reviewed    RADIOLOGY RESULTS: CT scans and PET CT scan reviewed compatible with above-stated   IMPRESSION: Stage Ia non-small cell lung cancer of the right upper lobe and 76 year old female  PLAN: At this time elected ahead with SBRT delivering 60 Gray in 5 fractions to her right upper lobe lesion.  Based on her medical comorbidities she is not a candidate for biopsy or bronchoscopy for CT-guided.  Risks and benefits including extremely low side effect profile of SBRT were discussed with the patient and her granddaughter.  Being that this is a right upper lobe lesion would not affect her overall pulmonary status.  I have personally 7 ordered CT simulation for next week.  Will use for dimensional treatment planning as well as motion restriction.  Patient comprehends my recommendations well.  I would like to take this opportunity to thank you for allowing me to participate in the care of your patient.Noreene Filbert, MD

## 2022-09-08 ENCOUNTER — Telehealth: Payer: Self-pay

## 2022-09-09 ENCOUNTER — Ambulatory Visit
Admission: RE | Admit: 2022-09-09 | Discharge: 2022-09-09 | Disposition: A | Discharge status: Home / Self Care | Payer: Medicare Other | Source: Ambulatory Visit | Attending: Radiation Oncology | Admitting: Radiation Oncology

## 2022-09-09 ENCOUNTER — Other Ambulatory Visit: Payer: Self-pay | Admitting: Nurse Practitioner

## 2022-09-09 DIAGNOSIS — C3411 Malignant neoplasm of upper lobe, right bronchus or lung: Secondary | ICD-10-CM | POA: Diagnosis not present

## 2022-09-09 DIAGNOSIS — Z87891 Personal history of nicotine dependence: Secondary | ICD-10-CM | POA: Diagnosis not present

## 2022-09-09 DIAGNOSIS — Z510 Encounter for antineoplastic radiation therapy: Secondary | ICD-10-CM | POA: Diagnosis not present

## 2022-09-09 DIAGNOSIS — G8929 Other chronic pain: Secondary | ICD-10-CM

## 2022-09-09 MED ORDER — OXYCODONE-ACETAMINOPHEN 7.5-325 MG PO TABS: ORAL_TABLET | ORAL | 0 refills | Status: DC

## 2022-09-09 NOTE — Telephone Encounter (Signed)
Lmom to pt that we send med

## 2022-09-11 DIAGNOSIS — Z8679 Personal history of other diseases of the circulatory system: Secondary | ICD-10-CM | POA: Diagnosis not present

## 2022-09-11 DIAGNOSIS — J432 Centrilobular emphysema: Secondary | ICD-10-CM | POA: Diagnosis not present

## 2022-09-11 DIAGNOSIS — Z7901 Long term (current) use of anticoagulants: Secondary | ICD-10-CM | POA: Diagnosis not present

## 2022-09-11 DIAGNOSIS — Z01810 Encounter for preprocedural cardiovascular examination: Secondary | ICD-10-CM | POA: Diagnosis not present

## 2022-09-11 DIAGNOSIS — Z87891 Personal history of nicotine dependence: Secondary | ICD-10-CM | POA: Diagnosis not present

## 2022-09-11 DIAGNOSIS — Z950 Presence of cardiac pacemaker: Secondary | ICD-10-CM | POA: Diagnosis not present

## 2022-09-11 DIAGNOSIS — R911 Solitary pulmonary nodule: Secondary | ICD-10-CM | POA: Diagnosis not present

## 2022-09-11 DIAGNOSIS — I2510 Atherosclerotic heart disease of native coronary artery without angina pectoris: Secondary | ICD-10-CM | POA: Diagnosis not present

## 2022-09-11 DIAGNOSIS — J9811 Atelectasis: Secondary | ICD-10-CM | POA: Diagnosis not present

## 2022-09-11 DIAGNOSIS — J811 Chronic pulmonary edema: Secondary | ICD-10-CM | POA: Diagnosis not present

## 2022-09-11 DIAGNOSIS — Z9889 Other specified postprocedural states: Secondary | ICD-10-CM | POA: Diagnosis not present

## 2022-09-11 DIAGNOSIS — Z953 Presence of xenogenic heart valve: Secondary | ICD-10-CM | POA: Diagnosis not present

## 2022-09-11 DIAGNOSIS — I4891 Unspecified atrial fibrillation: Secondary | ICD-10-CM | POA: Diagnosis not present

## 2022-09-11 DIAGNOSIS — I5030 Unspecified diastolic (congestive) heart failure: Secondary | ICD-10-CM | POA: Diagnosis not present

## 2022-09-11 DIAGNOSIS — Q231 Congenital insufficiency of aortic valve: Secondary | ICD-10-CM | POA: Diagnosis not present

## 2022-09-13 NOTE — Progress Notes (Unsigned)
PROVIDER NOTE: Information contained herein reflects review and annotations entered in association with encounter. Interpretation of such information and data should be left to medically-trained personnel. Information provided to patient can be located elsewhere in the medical record under "Patient Instructions". Document created using STT-dictation technology, any transcriptional errors that may result from process are unintentional.    Patient: [name]  Service Category: E/M  Provider: Gaspar Cola, MD  DOB: [birth year]  DOS: 09/14/2022  Referring Provider: [name], MD  MRN: [id number]  Specialty: Interventional Pain Management  PCP: [name], MD  Type: Established Patient  Setting: Ambulatory outpatient    Location: Office  Delivery: Face-to-face     HPI  [name], a 77 y.o. year old female, is here today because of her No primary diagnosis found.. [name]'s primary complain today is No chief complaint on file. Last encounter: My last encounter with her was on 08/12/2022. Pertinent problems: 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; Shoulder impingement syndrome (Left); Chronic left shoulder pain; Chronic pain syndrome; Chronic shoulder pain (1ry area of Pain) (Bilateral) (L>R); Chronic upper extremity pain (2ry area of Pain) (Bilateral) (L>R); Cervicalgia; Chronic neck pain (3ry area of Pain) (Posterior) (Bilateral) (L>R); Shoulder blade pain (4th area of Pain) (Left); Osteoarthritis of glenohumeral joint (Left); Osteoarthritis of AC (acromioclavicular) joint (Left); Osteoarthritis of glenohumeral joints (Bilateral); Osteoarthritis of acromioclavicular joints (Bilateral); Primary osteoarthritis of shoulders (Bilateral); DDD (degenerative disc disease), cervical; Cervical radiculitis (Left); Cervical radiculopathy (Left); C6 radiculopathy (Left); C7 radiculopathy (Left); Unable to maintain body in lying position; Chronic intractable  headache; DISH (diffuse idiopathic skeletal hyperostosis); Abnormal CT scan, cervical spine (03/04/2022); Trigger point of shoulder region (Bilateral); Trigger point with neck pain; Chronic upper back pain; Chronic myofascial pain; and Musculoskeletal disorder involving upper trapezius muscle on their pertinent problem list. Pain Assessment: Severity of   is reported as a  /10. Location:    / . Onset:  . Quality:  . Timing:  . Modifying factor(s):  [name] Vitals:  vitals were not taken for this visit.  BMI: Estimated body mass index is 36.72 kg/m as calculated from the following:   Height as of 08/20/22: 5' (1.524 m).   Weight as of 08/20/22: 188 lb (85.3 kg).  Reason for encounter: medication management. ***    Pharmacotherapy Assessment  Analgesic: Oxycodone/APAP 7.5/325, 1 tab p.o. daily (# 45) (last filled on 06/02/2021) by Erich Montane, FNP-C, working for Dr. Talmadge Chad, MD. MME/day: 7.5 mg/day   Monitoring: [place] PMP: PDMP reviewed during this encounter.       Pharmacotherapy: No side-effects or adverse reactions reported. Compliance: No problems identified. Effectiveness: Clinically acceptable.  No notes on file  No results found for: "CBDTHCR" No results found for: "D8THCCBX" No results found for: "D9THCCBX"  UDS:  Summary  Date Value Ref Range Status  04/30/2021 Note  Final    Comment:    ==================================================================== Compliance Drug Analysis, Ur ==================================================================== Test                             Result       Flag       Units  Drug Present and Declared for Prescription Verification   Alpha-hydroxyalprazolam        107          EXPECTED   ng/mg creat    Alpha-hydroxyalprazolam is an expected metabolite of alprazolam.  Source of alprazolam is a scheduled prescription medication.    Oxycodone                      1371         EXPECTED   ng/mg creat   Oxymorphone                     600          EXPECTED   ng/mg creat   Noroxycodone                   2311         EXPECTED   ng/mg creat    Sources of oxycodone include scheduled prescription medications.    Oxymorphone and noroxycodone are expected metabolites of oxycodone.    Oxymorphone is also available as a scheduled prescription medication.    Sertraline                     PRESENT      EXPECTED   Desmethylsertraline            PRESENT      EXPECTED    Desmethylsertraline is an expected metabolite of sertraline.    Trazodone                      PRESENT      EXPECTED   1,3 chlorophenyl piperazine    PRESENT      EXPECTED    1,3-chlorophenyl piperazine is an expected metabolite of trazodone.    Acetaminophen                  PRESENT      EXPECTED  Drug Absent but Declared for Prescription Verification   Diclofenac                     Not Detected UNEXPECTED    Diclofenac, as indicated in the declared medication list, is not    always detected even when used as directed.  ==================================================================== Test                      Result    Flag   Units      Ref Range   Creatinine              45               mg/dL      >=20 ==================================================================== Declared Medications:  The flagging and interpretation on this report are based on the  following declared medications.  Unexpected results may arise from  inaccuracies in the declared medications.   **Note: The testing scope of this panel includes these medications:   Alprazolam (Xanax)  Oxycodone (Percocet)  Sertraline (Zoloft)  Trazodone (Desyrel)   **Note: The testing scope of this panel does not include small to  moderate amounts of these reported medications:   Acetaminophen (Tylenol)  Acetaminophen (Percocet)  Diclofenac (Voltaren)   **Note: The testing scope of this panel does not include the  following reported medications:   Albuterol (Ventolin HFA)   Apixaban (Eliquis)  Atorvastatin (Lipitor)  Bumetanide (Bumex)  Calcium  Flecainide (Tambocor)  Fluticasone (Trelegy)  Iron  Levothyroxine (Synthroid)  Lisinopril (Zestril)  Loratadine (Claritin)  Pantoprazole (Protonix)  Potassium (Klor-Con)  Ubiquinone (CoQ10)  Umeclidinium (Trelegy)  Vilanterol (Trelegy)  Vitamin D ==================================================================== For clinical consultation, please call [phone]. ====================================================================  ROS  Constitutional: Denies any fever or chills Gastrointestinal: No reported hemesis, hematochezia, vomiting, or acute GI distress Musculoskeletal: Denies any acute onset joint swelling, redness, loss of ROM, or weakness Neurological: No reported episodes of acute onset apraxia, aphasia, dysarthria, agnosia, amnesia, paralysis, loss of coordination, or loss of consciousness  Medication Review  ALPRAZolam, Budeson-Glycopyrrol-Formoterol, Calcium Carbonate-Vitamin D, CoQ10, albuterol, apixaban, atorvastatin, bumetanide, carvedilol, diclofenac Sodium, empagliflozin, ferrous sulfate, ipratropium-albuterol, levothyroxine, lisinopril, loratadine, ondansetron, oxyCODONE-acetaminophen, pantoprazole, potassium chloride, sertraline, sodium chloride, and traZODone  History Review  Allergy: [name] is allergic to benadryl [diphenhydramine hcl (sleep)], cetirizine, lasix [furosemide], levaquin [levofloxacin in d5w], meloxicam, soy allergy, and sulfa antibiotics. Drug: [name]  reports no history of drug use. Alcohol:  reports no history of alcohol use. Tobacco:  reports that she has quit smoking. Her smoking use included cigarettes. She has never used smokeless tobacco. Social: [name]  reports that she has quit smoking. Her smoking use included cigarettes. She has never used smokeless tobacco. She reports that she does not drink alcohol and does not use drugs. Medical:  has  a past medical history of Aortic stenosis due to bicuspid aortic valve, Aspiration pneumonia ([name]), Atrial fibrillation with RVR ([place]) (01/11/2015), Atrial flutter ([place]), Basal skull fracture ([name]) (20 yrs ago), CHF (congestive heart failure) ([place]), Chronic respiratory failure ([place]), COPD (chronic obstructive pulmonary disease) ([place]), Deafness in left ear, Essential hypertension (01/24/2015), History of cardiac cath, History of stress test, HLD (hyperlipidemia), HTN (hypertension), Hypothyroidism, Obesity, PAF (paroxysmal atrial fibrillation) ([place]), Paroxysmal atrial fibrillation ([place]) (01/24/2015), Right upper quadrant abdominal tenderness without rebound tenderness (03/02/2018), and S/P ascending aortic aneurysm repair (2008). Surgical: [name]  has a past surgical history that includes Abdominal aortic aneurysm repair (2008); Aortic valve replacement (2008); Abdominal hysterectomy; Carpal tunnel release; Tumor excision (Left); Cardiac catheterization; Cardiac catheterization (N/A, 08/17/2016); CARDIOVERSION (N/A, 08/15/2018); CARDIOVERSION (N/A, 11/14/2018); Cardioversion (N/A, 06/05/2019); Cardioversion (N/A, 08/14/2019); Cardioversion (N/A, 11/09/2019); and Cardioversion (N/A, 01/17/2020). Family: family history includes Stroke in her father and paternal grandmother.  Laboratory Chemistry Profile   Renal Lab Results  Component Value Date   BUN 20 08/07/2022   CREATININE 0.86 08/07/2022   BCR 21 03/20/2022   GFRAA >60 05/14/2020   GFRNONAA >60 08/07/2022    Hepatic Lab Results  Component Value Date   AST 18 08/03/2022   ALT 15 08/03/2022   ALBUMIN 3.6 08/03/2022   ALKPHOS 64 08/03/2022   LIPASE 30 07/10/2022    Electrolytes Lab Results  Component Value Date   NA 139 08/07/2022   K 3.5 08/07/2022   CL 102 08/07/2022   CALCIUM 9.0 08/07/2022   MG 2.2 08/07/2022   PHOS 3.5 01/30/2021    Bone Lab Results  Component Value Date   VD25OH 25.5 (L) 11/25/2020   25OHVITD1 31 04/30/2021    25OHVITD2 <1.0 04/30/2021   25OHVITD3 31 04/30/2021    Inflammation (CRP: Acute Phase) (ESR: Chronic Phase) Lab Results  Component Value Date   CRP 6 04/30/2021   ESRSEDRATE 40 04/30/2021   LATICACIDVEN 1.5 01/25/2022         Note: Above Lab results reviewed.  Recent Imaging Review  DG Chest 2 View CLINICAL DATA:  Shortness of breath  EXAM: CHEST - 2 VIEW  COMPARISON:  07/10/2022, chest CT 06/02/2022, 12/02/2021  FINDINGS: Post sternotomy changes and valve prosthesis. Left-sided pacing device. Mild diffuse reticular opacity with hazy ground-glass opacity at the bases likely related to chronic lung disease on prior CT. No confluent acute airspace disease.  IMPRESSION:  Mild diffuse reticular opacity with hazy ground-glass opacity at the bases likely related to chronic lung disease on prior CT. No definite acute superimposed airspace disease  Electronically Signed   By: Donavan Foil M.D.   On: 08/03/2022 21:30 Note: Reviewed        Physical Exam  General appearance: Well nourished, well developed, and well hydrated. In no apparent acute distress Mental status: Alert, oriented x 3 (person, place, & time)       Respiratory: No evidence of acute respiratory distress Eyes: PERLA Vitals: There were no vitals taken for this visit. BMI: Estimated body mass index is 36.72 kg/m as calculated from the following:   Height as of 08/20/22: 5' (1.524 m).   Weight as of 08/20/22: 188 lb (85.3 kg). Ideal: Patient weight not recorded  Assessment   Diagnosis Status  No diagnosis found. Controlled Controlled Controlled   Updated Problems: No problems updated.  Plan of Care  Problem-specific:  No problem-specific Assessment & Plan notes found for this encounter.  [name] has a current medication list which includes the following long-term medication(s): atorvastatin, bumetanide, calcium carbonate-vitamin d, carvedilol, eliquis, ferrous sulfate,  ipratropium-albuterol, levothyroxine, lisinopril, pantoprazole, potassium chloride, sertraline, trazodone, and ventolin hfa.  Pharmacotherapy (Medications Ordered): No orders of the defined types were placed in this encounter.  Orders:  No orders of the defined types were placed in this encounter.  Follow-up plan:   No follow-ups on file.     Interventional Therapies  Risk  Complexity Considerations:   Estimated body mass index is 38.87 kg/m as calculated from the following:   Height as of this encounter: 4\' 10"  (1.473 m).   Weight as of this encounter: 186 lb (84.4 kg). 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 DISH: (Left C7-T1 calcified ligamentum flavum) (see 10/04/2021 cervical CT Se: 7 Im: 39/81; Se: 9 Im: 56/88; Se: 11 Im: 76/88; Se: 5 Im: 63/76)   Planned  Pending:      Under consideration:   Palliative bilateral trapezius and supraspinatus m. MNB/TPI #3    Completed:   Diagnostic bilateral suprascapular NB x2 (09/25/2021) (100/100/100/100 x 2.5 months)  Therapeutic bilateral Trapezius and supraspinatus MNB x2 (06/16/2022) (100/100/90/90)    Therapeutic  Palliative (PRN) options:   Therapeutic upper back and shoulder (trapezius & supraspinatus) MNB/TPI #2       Recent Visits Date Type Provider Dept  07/02/22 Office Visit Milinda Pointer, MD [facility]  06/16/22 Procedure visit Milinda Pointer, MD [facility]  Showing recent visits within past 90 days and meeting all other requirements Future Appointments Date Type Provider Dept  09/14/22 Appointment Milinda Pointer, MD [facility]  Showing future appointments within next 90 days and meeting all other requirements  I discussed the assessment and treatment plan with the  patient. The patient was provided an opportunity to ask questions and all were answered. The patient agreed with the plan and demonstrated an understanding of the instructions.  Patient advised to call back or seek an in-person evaluation if the symptoms or condition worsens.  Duration of encounter: *** minutes.  Total time on encounter, as per AMA guidelines included both the face-to-face and non-face-to-face time personally spent by the physician and/or other qualified health care professional(s) on the day of the encounter (includes time in activities that  require the physician or other qualified health care professional and does not include time in activities normally performed by clinical staff). Physician's time may include the following activities when performed: Preparing to see the patient (e.g., pre-charting review of records, searching for previously ordered imaging, lab work, and nerve conduction tests) Review of prior analgesic pharmacotherapies. Reviewing PMP Interpreting ordered tests (e.g., lab work, imaging, nerve conduction tests) Performing post-procedure evaluations, including interpretation of diagnostic procedures Obtaining and/or reviewing separately obtained history Performing a medically appropriate examination and/or evaluation Counseling and educating the patient/family/caregiver Ordering medications, tests, or procedures Referring and communicating with other health care professionals (when not separately reported) Documenting clinical information in the electronic or other health record Independently interpreting results (not separately reported) and communicating results to the patient/ family/caregiver Care coordination (not separately reported)  Note by: Gaspar Cola, MD Date: 09/14/2022; Time: 5:54 PM

## 2022-09-14 ENCOUNTER — Ambulatory Visit (HOSPITAL_BASED_OUTPATIENT_CLINIC_OR_DEPARTMENT_OTHER): Payer: Medicare Other | Admitting: Pain Medicine

## 2022-09-14 ENCOUNTER — Other Ambulatory Visit: Payer: Self-pay | Admitting: Nurse Practitioner

## 2022-09-14 ENCOUNTER — Telehealth: Payer: Self-pay

## 2022-09-14 DIAGNOSIS — J449 Chronic obstructive pulmonary disease, unspecified: Secondary | ICD-10-CM

## 2022-09-14 DIAGNOSIS — Z91199 Patient's noncompliance with other medical treatment and regimen due to unspecified reason: Secondary | ICD-10-CM

## 2022-09-14 DIAGNOSIS — Z950 Presence of cardiac pacemaker: Secondary | ICD-10-CM | POA: Diagnosis not present

## 2022-09-14 DIAGNOSIS — J0110 Acute frontal sinusitis, unspecified: Secondary | ICD-10-CM

## 2022-09-14 MED ORDER — AMOXICILLIN-POT CLAVULANATE 875-125 MG PO TABS: 1.0000 | ORAL_TABLET | Freq: Two times a day (BID) | ORAL | 0 refills | Status: DC

## 2022-09-14 MED ORDER — PREDNISONE 10 MG PO TABS: ORAL_TABLET | ORAL | 0 refills | Status: DC

## 2022-09-14 NOTE — Telephone Encounter (Signed)
Pt called that she is coughing,wheezing and nasal congestion,no fever advised her that [name] send her amoxicillin and prednisone and she had appt with DSK tomorrow

## 2022-09-14 NOTE — Patient Instructions (Signed)
____________________________________________________________________________________________  Blood Thinners  IMPORTANT NOTICE:  If you take any of these, make sure to notify the nursing staff.  Failure to do so may result in injury.  Recommended time intervals to stop and restart blood-thinners, before & after invasive procedures  Generic Name Brand Name Pre-procedure. Stop this long before procedure. Post-procedure. Minimum waiting period before restarting.  Abciximab Reopro 15 days 2 hrs  Alteplase Activase 10 days 10 days  Anagrelide Agrylin    Apixaban Eliquis 3 days 6 hrs  Cilostazol Pletal 3 days 5 hrs  Clopidogrel Plavix 7-10 days 2 hrs  Dabigatran Pradaxa 5 days 6 hrs  Dalteparin Fragmin 24 hours 4 hrs  Dipyridamole Aggrenox 11days 2 hrs  Edoxaban Lixiana; Savaysa 3 days 2 hrs  Enoxaparin  Lovenox 24 hours 4 hrs  Eptifibatide Integrillin 8 hours 2 hrs  Fondaparinux  Arixtra 72 hours 12 hrs  Hydroxychloroquine Plaquenil 11 days   Prasugrel Effient 7-10 days 6 hrs  Reteplase Retavase 10 days 10 days  Rivaroxaban Xarelto 3 days 6 hrs  Ticagrelor Brilinta 5-7 days 6 hrs  Ticlopidine Ticlid 10-14 days 2 hrs  Tinzaparin Innohep 24 hours 4 hrs  Tirofiban Aggrastat 8 hours 2 hrs  Warfarin Coumadin 5 days 2 hrs   Other medications with blood-thinning effects  Product indications Generic (Brand) names Note  Cholesterol Lipitor Stop 4 days before procedure  Blood thinner (injectable) Heparin (LMW or LMWH Heparin) Stop 24 hours before procedure  Cancer Ibrutinib (Imbruvica) Stop 7 days before procedure  Malaria/Rheumatoid Hydroxychloroquine (Plaquenil) Stop 11 days before procedure  Thrombolytics  10 days before or after procedures   Over-the-counter (OTC) Products with blood-thinning effects  Product Common names Stop Time  Aspirin > 325 mg Goody Powders, Excedrin, etc. 11 days  Aspirin ? 81 mg  7 days  Fish oil  4 days  Garlic supplements  7 days  Ginkgo biloba  36  hours  Ginseng  24 hours  NSAIDs Ibuprofen, Naprosyn, etc. 3 days  Vitamin E  4 days   ____________________________________________________________________________________________   ______________________________________________________________________  Preparing for your procedure  During your procedure appointment there will be: No Prescription Refills. No disability issues to discussed. No medication changes or discussions.  Instructions: Food intake: Avoid eating anything solid for at least 8 hours prior to your procedure. Clear liquid intake: You may take clear liquids such as water up to 2 hours prior to your procedure. (No carbonated drinks. No soda.) Transportation: Unless otherwise stated by your physician, bring a driver. Morning Medicines: Except for blood thinners, take all of your other morning medications with a sip of water. Make sure to take your heart and blood pressure medicines. If your blood pressure's lower number is above 100, the case will be rescheduled. Blood thinners: If you take a blood thinner, but were not instructed to stop it, call our office [phone] and ask to talk to a nurse. Not stopping a blood thinner prior to certain procedures could lead to serious complications. Diabetics on insulin: Notify the staff so that you can be scheduled 1st case in the morning. If your diabetes requires high dose insulin, take only  of your normal insulin dose the morning of the procedure and notify the staff that you have done so. Preventing infections: Shower with an antibacterial soap the morning of your procedure.  Build-up your immune system: Take 1000 mg of Vitamin C with every meal (3 times a day) the day prior to your procedure. Antibiotics: Inform the nursing staff   if you are taking any antibiotics or if you have any conditions that may require antibiotics prior to procedures. (Example: recent joint implants)   Pregnancy: If you are pregnant make sure to  notify the nursing staff. Not doing so may result in injury to the fetus, including death.  Sickness: If you have a cold, fever, or any active infections, call and cancel or reschedule your procedure. Receiving steroids while having an infection may result in complications. Arrival: You must be in the facility at least 30 minutes prior to your scheduled procedure. Tardiness: Your scheduled time is also the cutoff time. If you do not arrive at least 15 minutes prior to your procedure, you will be rescheduled.  Children: Do not bring any children with you. Make arrangements to keep them home. Dress appropriately: There is always a possibility that your clothing may get soiled. Avoid long dresses. Valuables: Do not bring any jewelry or valuables.  Reasons to call and reschedule or cancel your procedure: (Following these recommendations will minimize the risk of a serious complication.) Surgeries: Avoid having procedures within 2 weeks of any surgery. (Avoid for 2 weeks before or after any surgery). Flu Shots: Avoid having procedures within 2 weeks of a flu shots or . (Avoid for 2 weeks before or after immunizations). Barium: Avoid having a procedure within 7-10 days after having had a radiological study involving the use of radiological contrast. (Myelograms, Barium swallow or enema study). Heart attacks: Avoid any elective procedures or surgeries for the initial 6 months after a "Myocardial Infarction" (Heart Attack). Blood thinners: It is imperative that you stop these medications before procedures. Let us know if you if you take any blood thinner.  Infection: Avoid procedures during or within two weeks of an infection (including chest colds or gastrointestinal problems). Symptoms associated with infections include: Localized redness, fever, chills, night sweats or profuse sweating, burning sensation when voiding, cough, congestion, stuffiness, runny nose, sore throat, diarrhea, nausea, vomiting, cold  or Flu symptoms, recent or current infections. It is specially important if the infection is over the area that we intend to treat. Heart and lung problems: Symptoms that may suggest an active cardiopulmonary problem include: cough, chest pain, breathing difficulties or shortness of breath, dizziness, ankle swelling, uncontrolled high or unusually low blood pressure, and/or palpitations. If you are experiencing any of these symptoms, cancel your procedure and contact your primary care physician for an evaluation.  Remember:  Regular Business hours are:  Monday to Thursday 8:00 AM to 4:00 PM  Provider's Schedule: Danie Hannig, MD:  Procedure days: Tuesday and Thursday 7:30 AM to 4:00 PM  Bilal Lateef, MD:  Procedure days: Monday and Wednesday 7:30 AM to 4:00 PM  ______________________________________________________________________    ____________________________________________________________________________________________  General Risks and Possible Complications  Patient Responsibilities: It is important that you read this as it is part of your informed consent. It is our duty to inform you of the risks and possible complications associated with treatments offered to you. It is your responsibility as a patient to read this and to ask questions about anything that is not clear or that you believe was not covered in this document.  Patient's Rights: You have the right to refuse treatment. You also have the right to change your mind, even after initially having agreed to have the treatment done. However, under this last option, if you wait until the last second to change your mind, you may be charged for the materials used up to that point.    Introduction: Medicine is not an exact science. Everything in Medicine, including the lack of treatment(s), carries the potential for danger, harm, or loss (which is by definition: Risk). In Medicine, a complication is a secondary problem,  condition, or disease that can aggravate an already existing one. All treatments carry the risk of possible complications. The fact that a side effects or complications occurs, does not imply that the treatment was conducted incorrectly. It must be clearly understood that these can happen even when everything is done following the highest safety standards.  No treatment: You can choose not to proceed with the proposed treatment alternative. The "PRO(s)" would include: avoiding the risk of complications associated with the therapy. The "CON(s)" would include: not getting any of the treatment benefits. These benefits fall under one of three categories: diagnostic; therapeutic; and/or palliative. Diagnostic benefits include: getting information which can ultimately lead to improvement of the disease or symptom(s). Therapeutic benefits are those associated with the successful treatment of the disease. Finally, palliative benefits are those related to the decrease of the primary symptoms, without necessarily curing the condition (example: decreasing the pain from a flare-up of a chronic condition, such as incurable terminal cancer).  General Risks and Complications: These are associated to most interventional treatments. They can occur alone, or in combination. They fall under one of the following six (6) categories: no benefit or worsening of symptoms; bleeding; infection; nerve damage; allergic reactions; and/or death. No benefits or worsening of symptoms: In Medicine there are no guarantees, only probabilities. No healthcare provider can ever guarantee that a medical treatment will work, they can only state the probability that it may. Furthermore, there is always the possibility that the condition may worsen, either directly, or indirectly, as a consequence of the treatment. Bleeding: This is more common if the patient is taking a blood thinner, either prescription or over the counter (example: Goody Powders,  Fish oil, Aspirin, Garlic, etc.), or if suffering a condition associated with impaired coagulation (example: Hemophilia, cirrhosis of the liver, low platelet counts, etc.). However, even if you do not have one on these, it can still happen. If you have any of these conditions, or take one of these drugs, make sure to notify your treating physician. Infection: This is more common in patients with a compromised immune system, either due to disease (example: diabetes, cancer, human immunodeficiency virus [HIV], etc.), or due to medications or treatments (example: therapies used to treat cancer and rheumatological diseases). However, even if you do not have one on these, it can still happen. If you have any of these conditions, or take one of these drugs, make sure to notify your treating physician. Nerve Damage: This is more common when the treatment is an invasive one, but it can also happen with the use of medications, such as those used in the treatment of cancer. The damage can occur to small secondary nerves, or to large primary ones, such as those in the spinal cord and brain. This damage may be temporary or permanent and it may lead to impairments that can range from temporary numbness to permanent paralysis and/or brain death. Allergic Reactions: Any time a substance or material comes in contact with our body, there is the possibility of an allergic reaction. These can range from a mild skin rash (contact dermatitis) to a severe systemic reaction (anaphylactic reaction), which can result in death. Death: In general, any medical intervention can result in death, most of the time due to an unforeseen complication. ____________________________________________________________________________________________    

## 2022-09-15 ENCOUNTER — Telehealth (INDEPENDENT_AMBULATORY_CARE_PROVIDER_SITE_OTHER): Payer: BC Managed Care – PPO | Admitting: Nurse Practitioner

## 2022-09-15 ENCOUNTER — Encounter: Payer: Self-pay | Admitting: Internal Medicine

## 2022-09-15 DIAGNOSIS — R051 Acute cough: Secondary | ICD-10-CM | POA: Diagnosis not present

## 2022-09-15 DIAGNOSIS — J449 Chronic obstructive pulmonary disease, unspecified: Secondary | ICD-10-CM

## 2022-09-15 DIAGNOSIS — J0110 Acute frontal sinusitis, unspecified: Secondary | ICD-10-CM

## 2022-09-15 MED ORDER — BENZONATATE 200 MG PO CAPS: 200.0000 mg | ORAL_CAPSULE | Freq: Two times a day (BID) | ORAL | 0 refills | Status: DC | PRN

## 2022-09-15 MED ORDER — REVEFENACIN 175 MCG/3ML IN SOLN: 175.0000 ug | Freq: Every day | RESPIRATORY_TRACT | 5 refills | Status: DC

## 2022-09-15 NOTE — Progress Notes (Signed)
[facility] [address] [place], [place] [address]  Internal MEDICINE  Telephone Visit  Patient Name: [name]  [id number]  [id number]  Date of Service: 09/15/2022  I connected with the patient at 1310 by telephone and verified the patients identity using two identifiers.   I discussed the limitations, risks, security and privacy concerns of performing an evaluation and management service by telephone and the availability of in person appointments. I also discussed with the patient that there may be a patient responsible charge related to the service.  The patient expressed understanding and agrees to proceed.    Chief Complaint  Patient presents with   Telephone Screen    [phone], prefers telephone call   Telephone Assessment   Cough   Nasal Congestion   Shortness of Breath    HPI [name] presents for a telehealth virtual visit for upper respiratory infection Having cough, nasal congestion, and SOB Seen by surgeon, Needs a new heart valve  Has lung cancer, cannot have radiation tx until her heart valve is repaired. Was treated with augmentin and prednisone yesterday and is feeling a little better Robitussin not helping with cough Not using nebulizer treatment like she should   Current Medication: Outpatient Encounter Medications as of 09/15/2022  Medication Sig Note   ALPRAZolam (XANAX) 0.25 MG tablet TAKE 1 TABLET(0.25 MG) BY MOUTH TWICE DAILY AS NEEDED FOR ANXIETY (Patient taking differently: Take 0.25 mg by mouth 2 (two) times daily as needed for anxiety.)    amoxicillin-clavulanate (AUGMENTIN) 875-125 MG tablet Take 1 tablet by mouth 2 (two) times daily.    atorvastatin (LIPITOR) 40 MG tablet TAKE 1 TABLET BY MOUTH EVERY NIGHT AT BEDTIME    benzonatate (TESSALON) 200 MG capsule Take 1 capsule (200 mg total) by mouth 2 (two) times daily as needed for cough.    Budeson-Glycopyrrol-Formoterol (BREZTRI AEROSPHERE) 160-9-4.8 MCG/ACT AERO Inhale 2 puffs into  the lungs 2 (two) times daily.    bumetanide (BUMEX) 1 MG tablet Take 2 tablets (2 mg total) by mouth 2 (two) times daily. And additional 2 tablets PM PRN    Calcium Carbonate-Vitamin D (CALCIUM 600+D PO) Take 1 tablet by mouth daily.    carvedilol (COREG) 6.25 MG tablet Take 1 tablet (6.25 mg total) by mouth 2 (two) times daily with a meal.    Coenzyme Q10 (COQ10) 200 MG CAPS Take 200 mg by mouth daily.    diclofenac Sodium (VOLTAREN) 1 % GEL SMARTSIG:Gram(s) Topical Twice Daily    ELIQUIS 5 MG TABS tablet TAKE 1 TABLET BY MOUTH TWICE DAILY    empagliflozin (JARDIANCE) 10 MG TABS tablet Take 1 tablet (10 mg total) by mouth daily before breakfast.    ferrous sulfate 325 (65 FE) MG tablet Take 1 tablet (325 mg total) by mouth daily with breakfast.    ipratropium-albuterol (DUONEB) 0.5-2.5 (3) MG/3ML SOLN Inhale 3 mLs into the lungs every 4 (four) hours as needed. Inhale 3 mls into the lungs every 4 to 6 hours and as needed    levothyroxine (SYNTHROID) 25 MCG tablet TAKE 1 TABLET BY MOUTH DAILY BEFORE BREAKFAST    lisinopril (ZESTRIL) 40 MG tablet TAKE 1 TABLET(40 MG) BY MOUTH DAILY(DOSE INCREASE)    loratadine (CLARITIN) 10 MG tablet Take 10 mg by mouth daily.    ondansetron (ZOFRAN-ODT) 4 MG disintegrating tablet Take 1 tablet (4 mg total) by mouth every 8 (eight) hours as needed for nausea or vomiting.    oxyCODONE-acetaminophen (PERCOCET) 7.5-325 MG tablet Take one  tab po bid for pain severe    pantoprazole (PROTONIX) 40 MG tablet TAKE 1 TABLET BY MOUTH EVERY DAY    potassium chloride (KLOR-CON) 10 MEQ tablet Take 4 tablets (40 mEq total) by mouth 2 (two) times daily.    predniSONE (DELTASONE) 10 MG tablet Take one tab 3 x day for 3 days, then take one tab 2 x a day for 3 days and then take one tab a day for 3 days for copd    revefenacin (YUPELRI) 175 MCG/3ML nebulizer solution Take 3 mLs (175 mcg total) by nebulization daily.    sertraline (ZOLOFT) 100 MG tablet Take one tab a day for Anxiety  (Patient taking differently: Take 100 mg by mouth daily. Take one tab a day for Anxiety)    sodium chloride (OCEAN) 0.65 % SOLN nasal spray Place 1 spray into both nostrils as needed for congestion.    traZODone (DESYREL) 50 MG tablet Take 1 tablet (50 mg total) by mouth at bedtime.    VENTOLIN HFA 108 (90 Base) MCG/ACT inhaler INHALE 2 PUFFS INTO THE LUNGS EVERY 6 HOURS AS NEEDED FOR WHEEZING OR SHORTNESS OF BREATH    [DISCONTINUED] apixaban (ELIQUIS) 5 MG TABS tablet Take 1 tablet (5 mg total) by mouth 2 (two) times daily.    [DISCONTINUED] oxyCODONE-acetaminophen (PERCOCET) 7.5-325 MG tablet Take one tab po bid for pain severe (Patient taking differently: Take 1 tablet by mouth every 12 (twelve) hours as needed for moderate pain.) 08/04/2022: Patient takes one tablet every morning and one-half tablet in afternoon and bedtime   No facility-administered encounter medications on file as of 09/15/2022.    Surgical History: Past Surgical History:  Procedure Laterality Date   ABDOMINAL AORTIC ANEURYSM REPAIR  2008   ABDOMINAL HYSTERECTOMY     AORTIC VALVE REPLACEMENT  2008   CARDIAC CATHETERIZATION     [place]   CARDIOVERSION N/A 08/15/2018   Procedure: CARDIOVERSION;  Surgeon: [name], MD;  Location: [facility] ORS;  Service: Cardiovascular;  Laterality: N/A;   CARDIOVERSION N/A 11/14/2018   Procedure: CARDIOVERSION (CATH LAB);  Surgeon: [name], MD;  Location: [facility];  Service: Cardiovascular;  Laterality: N/A;   CARDIOVERSION N/A 06/05/2019   Procedure: CARDIOVERSION;  Surgeon: [name], MD;  Location: [facility] OR[facility];  Service: Cardiovascular;  Laterality: N/A;   CARDIOVERSION N/A 08/14/2019   Procedure: CARDIOVERSION;  Surgeon: [name], MD;  Location: [facility];  Service: Cardiovascular;  Laterality: N/A;   CARDIOVERSION N/A 11/09/2019   Procedure: CARDIOVERSION;  Surgeon: Isaias Cowman, MD;  Location: [facility];  Service: Cardiovascular;  Laterality: N/A;    CARDIOVERSION N/A 01/17/2020   Procedure: CARDIOVERSION;  Surgeon: Isaias Cowman, MD;  Location: [facility];  Service: Cardiovascular;  Laterality: N/A;   CARPAL TUNNEL RELEASE     ELECTROPHYSIOLOGIC STUDY N/A 08/17/2016   Procedure: Cardioversion;  Surgeon: [name], MD;  Location: [facility];  Service: Cardiovascular;  Laterality: N/A;   TUMOR EXCISION Left    x3 (arm)    Medical History: Past Medical History:  Diagnosis Date   Aortic 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 s/p bioprosthetic valve replacement 2008 at [facility];  b. 01/2015 Echo: EF 60-65%, no rwma, Gr1 DD, mildly dil LA, nl RV fxn.   Aspiration pneumonia ([place])    Atrial fibrillation with RVR ([place]) 01/11/2015   Atrial flutter ([place])    a. 08/2016 s/p DCCV.  Remains on flecainide 50 mg bid.   Basal skull fracture (  [name]) 20 yrs ago   CHF (congestive heart failure) ([name])    Chronic respiratory failure ([name])    COPD (chronic obstructive pulmonary disease) ([name])    a. 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 2008 prior to Aortic aneurysm repair-->nl cors.   History of stress test    a. 10/2015 MV: no ischemia/infarct.   HLD (hyperlipidemia)    HTN (hypertension)    Hypothyroidism    Obesity    PAF (paroxysmal atrial fibrillation) ([name])    a. on Eliquis; b. CHADS2VASc = 3 (HTN, age x 1, female).   Paroxysmal atrial fibrillation ([name]) 01/24/2015   Right upper quadrant abdominal tenderness without rebound tenderness 03/02/2018   S/P ascending aortic aneurysm repair 2008    Family History: Family History  Problem Relation Age of Onset   Stroke Father    Stroke Paternal Grandmother     Social History   Socioeconomic History   Marital status: Single    Spouse name: Not on file   Number of children: Not on file   Years of education: Not on file   Highest education level: Not on file   Occupational History   Not on file  Tobacco Use   Smoking status: Former    Types: Cigarettes   Smokeless tobacco: Never  Vaping Use   Vaping Use: Never used  Substance and Sexual Activity   Alcohol use: No   Drug use: No   Sexual activity: Never    Birth control/protection: Surgical  Other Topics Concern   Not on file  Social History Narrative   Not on file   Social Determinants of Health   Financial Resource Strain: Low Risk  (08/04/2021)   Overall Financial Resource Strain (CARDIA)    Difficulty of Paying Living Expenses: Not very hard  Food Insecurity: No Food Insecurity (08/05/2022)   Hunger Vital Sign    Worried About Running Out of Food in the Last Year: Never true    Ran Out of Food in the Last Year: Never true  Transportation Needs: No Transportation Needs (08/05/2022)   PRAPARE - Administrator, Civil Service (Medical): No    Lack of Transportation (Non-Medical): No  Physical Activity: Not on file  Stress: Not on file  Social Connections: Not on file  Intimate Partner Violence: Not At Risk (08/05/2022)   Humiliation, Afraid, Rape, and Kick questionnaire    Fear of Current or Ex-Partner: No    Emotionally Abused: No    Physically Abused: No    Sexually Abused: No      Review of Systems  Constitutional:  Positive for activity change and fatigue.  Respiratory:  Positive for cough, chest tightness, shortness of breath and wheezing.   Cardiovascular: Negative.  Negative for chest pain and palpitations.  Gastrointestinal:  Negative for abdominal distention, abdominal pain (upper/epigastric), constipation, diarrhea, nausea and vomiting.  Musculoskeletal:  Negative for arthralgias.  Neurological:  Positive for weakness.  Psychiatric/Behavioral:  Positive for sleep disturbance. Negative for self-injury and suicidal ideas. The patient is not nervous/anxious.     Vital Signs: There were no vitals taken for this visit.   Observation/Objective: She  is alert and oriented and engages in conversation appropriately.     Assessment/Plan: 1. Acute non-recurrent frontal sinusitis Prescribed additional medication for cough, continue augmentin and prednisone  as prescribed.  - benzonatate (TESSALON) 200 MG capsule; Take 1 capsule (200 mg total) by mouth 2 (two) times daily as needed for cough.  Dispense: 30 capsule; Refill: 0  2. COPD mixed type ([name]) Medication for cough prescribed. Long acting neb treatment yupelri also ordered to help improve breathing  - benzonatate (TESSALON) 200 MG capsule; Take 1 capsule (200 mg total) by mouth 2 (two) times daily as needed for cough.  Dispense: 30 capsule; Refill: 0 - revefenacin (YUPELRI) 175 MCG/3ML nebulizer solution; Take 3 mLs (175 mcg total) by nebulization daily.  Dispense: 90 mL; Refill: 5  3. Acute cough Medication for cough prescribed.  - benzonatate (TESSALON) 200 MG capsule; Take 1 capsule (200 mg total) by mouth 2 (two) times daily as needed for cough.  Dispense: 30 capsule; Refill: 0 - revefenacin (YUPELRI) 175 MCG/3ML nebulizer solution; Take 3 mLs (175 mcg total) by nebulization daily.  Dispense: 90 mL; Refill: 5   General Counseling: Zosia verbalizes understanding of the findings of today's phone visit and agrees with plan of treatment. I have discussed any further diagnostic evaluation that may be needed or ordered today. We also reviewed her medications today. she has been encouraged to call the office with any questions or concerns that should arise related to todays visit.  Return if symptoms worsen or fail to improve.   No orders of the defined types were placed in this encounter.   Meds ordered this encounter  Medications   benzonatate (TESSALON) 200 MG capsule    Sig: Take 1 capsule (200 mg total) by mouth 2 (two) times daily as needed for cough.    Dispense:  30 capsule    Refill:  0   revefenacin (YUPELRI) 175 MCG/3ML nebulizer solution    Sig: Take 3 mLs (175 mcg  total) by nebulization daily.    Dispense:  90 mL    Refill:  5    [facility] [facility]    Time spent:10 Minutes Time spent with patient included reviewing progress notes, labs, imaging studies, and discussing plan for follow up.  [place] Controlled Substance Database was reviewed by me for overdose risk score (ORS) if appropriate.  This patient was seen by Sallyanne Kuster, FNP-C in collaboration with Dr. Beverely Risen as a part of collaborative care agreement.  Reyden Smith R. Tedd Sias, MSN, FNP-C Internal medicine

## 2022-09-20 ENCOUNTER — Other Ambulatory Visit: Payer: Self-pay | Admitting: Internal Medicine

## 2022-09-20 DIAGNOSIS — F321 Major depressive disorder, single episode, moderate: Secondary | ICD-10-CM

## 2022-09-22 ENCOUNTER — Ambulatory Visit: Payer: Medicare Other

## 2022-09-24 ENCOUNTER — Ambulatory Visit: Payer: Medicare Other

## 2022-09-24 ENCOUNTER — Other Ambulatory Visit: Payer: Self-pay | Admitting: Family

## 2022-09-24 MED ORDER — BUMETANIDE 1 MG PO TABS: 2.0000 mg | ORAL_TABLET | Freq: Two times a day (BID) | ORAL | 3 refills | Status: DC

## 2022-09-27 ENCOUNTER — Other Ambulatory Visit: Payer: Self-pay | Admitting: Internal Medicine

## 2022-09-27 DIAGNOSIS — F411 Generalized anxiety disorder: Secondary | ICD-10-CM

## 2022-09-28 ENCOUNTER — Ambulatory Visit: Payer: Medicare Other

## 2022-09-29 ENCOUNTER — Ambulatory Visit: Payer: Medicare Other

## 2022-09-30 ENCOUNTER — Ambulatory Visit: Payer: Medicare Other

## 2022-10-01 ENCOUNTER — Ambulatory Visit: Payer: Medicare Other

## 2022-10-01 MED ORDER — ALPRAZOLAM 0.25 MG PO TABS: 0.2500 mg | ORAL_TABLET | Freq: Two times a day (BID) | ORAL | 1 refills | Status: DC | PRN

## 2022-10-02 ENCOUNTER — Other Ambulatory Visit: Payer: Self-pay | Admitting: Nurse Practitioner

## 2022-10-02 ENCOUNTER — Other Ambulatory Visit: Payer: Self-pay | Admitting: Internal Medicine

## 2022-10-02 DIAGNOSIS — G4733 Obstructive sleep apnea (adult) (pediatric): Secondary | ICD-10-CM | POA: Diagnosis not present

## 2022-10-02 DIAGNOSIS — J449 Chronic obstructive pulmonary disease, unspecified: Secondary | ICD-10-CM | POA: Diagnosis not present

## 2022-10-02 NOTE — Telephone Encounter (Signed)
Please review its ok to send

## 2022-10-05 ENCOUNTER — Ambulatory Visit: Payer: Medicare Other

## 2022-10-06 ENCOUNTER — Ambulatory Visit: Payer: Medicare Other

## 2022-10-08 ENCOUNTER — Other Ambulatory Visit: Payer: Self-pay | Admitting: Family

## 2022-10-08 ENCOUNTER — Telehealth: Payer: Self-pay

## 2022-10-08 DIAGNOSIS — G8929 Other chronic pain: Secondary | ICD-10-CM

## 2022-10-08 MED ORDER — OXYCODONE-ACETAMINOPHEN 7.5-325 MG PO TABS: ORAL_TABLET | ORAL | 0 refills | Status: DC

## 2022-10-08 NOTE — Telephone Encounter (Signed)
Refill for percocet ordered

## 2022-10-08 NOTE — Telephone Encounter (Signed)
Patient called to request refill of lisinopril.  Informed patient that provider, Darylene Price, sent refill, per pharmacy request,earlier today. Patient verbalized understanding and denied any other needs at this time.

## 2022-10-16 NOTE — Progress Notes (Unsigned)
PROVIDER NOTE: Information contained herein reflects review and annotations entered in association with encounter. Interpretation of such information and data should be left to medically-trained personnel. Information provided to patient can be located elsewhere in the medical record under "Patient Instructions". Document created using STT-dictation technology, any transcriptional errors that may result from process are unintentional.    Patient: [name]  Service Category: E/M  Provider: Gaspar Cola, MD  DOB: 03/01/1946  DOS: 10/21/2022  Referring Provider: [name], MD  MRN: [id number]  Specialty: Interventional Pain Management  PCP: [name], MD  Type: Established Patient  Setting: Ambulatory outpatient    Location: Office  Delivery: Face-to-face     HPI  [name], a 77 y.o. year old female, is here today because of her No primary diagnosis found.. [name]'s primary complain today is No chief complaint on file. Last encounter: My last encounter with her was on 09/14/2022. Pertinent problems: 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; Shoulder impingement syndrome (Left); Chronic left shoulder pain; Chronic pain syndrome; Chronic shoulder pain (1ry area of Pain) (Bilateral) (L>R); Chronic upper extremity pain (2ry area of Pain) (Bilateral) (L>R); Cervicalgia; Chronic neck pain (3ry area of Pain) (Posterior) (Bilateral) (L>R); Shoulder blade pain (4th area of Pain) (Left); Osteoarthritis of glenohumeral joint (Left); Osteoarthritis of AC (acromioclavicular) joint (Left); Osteoarthritis of glenohumeral joints (Bilateral); Osteoarthritis of acromioclavicular joints (Bilateral); Primary osteoarthritis of shoulders (Bilateral); DDD (degenerative disc disease), cervical; Cervical radiculitis (Left); Cervical radiculopathy (Left); C6 radiculopathy (Left); C7 radiculopathy (Left); Unable to maintain body in lying position; Chronic intractable  headache; DISH (diffuse idiopathic skeletal hyperostosis); Abnormal CT scan, cervical spine (03/04/2022); Trigger point of shoulder region (Bilateral); Trigger point with neck pain; Chronic upper back pain; Chronic myofascial pain; and Musculoskeletal disorder involving upper trapezius muscle on their pertinent problem list. Pain Assessment: Severity of   is reported as a  /10. Location:    / . Onset:  . Quality:  . Timing:  . Modifying factor(s):  [name] Vitals:  vitals were not taken for this visit.  BMI: Estimated body mass index is 36.72 kg/m as calculated from the following:   Height as of 08/20/22: 5' (1.524 m).   Weight as of 08/20/22: 188 lb (85.3 kg).  Reason for encounter:  *** . ***  Pharmacotherapy Assessment  Analgesic: Oxycodone/APAP 7.5/325, 1 tab p.o. daily (# 45) (last filled on 06/02/2021) by Erich Montane, FNP-C, working for Dr. Talmadge Chad, MD. MME/day: 7.5 mg/day   Monitoring: [place] PMP: PDMP reviewed during this encounter.       Pharmacotherapy: No side-effects or adverse reactions reported. Compliance: No problems identified. Effectiveness: Clinically acceptable.  No notes on file  No results found for: "CBDTHCR" No results found for: "D8THCCBX" No results found for: "D9THCCBX"  UDS:  Summary  Date Value Ref Range Status  04/30/2021 Note  Final    Comment:    ==================================================================== Compliance Drug Analysis, Ur ==================================================================== Test                             Result       Flag       Units  Drug Present and Declared for Prescription Verification   Alpha-hydroxyalprazolam        107          EXPECTED   ng/mg creat    Alpha-hydroxyalprazolam is an expected metabolite of alprazolam.  Source of alprazolam is a scheduled prescription medication.    Oxycodone                      1371         EXPECTED   ng/mg creat   Oxymorphone                    600           EXPECTED   ng/mg creat   Noroxycodone                   2311         EXPECTED   ng/mg creat    Sources of oxycodone include scheduled prescription medications.    Oxymorphone and noroxycodone are expected metabolites of oxycodone.    Oxymorphone is also available as a scheduled prescription medication.    Sertraline                     PRESENT      EXPECTED   Desmethylsertraline            PRESENT      EXPECTED    Desmethylsertraline is an expected metabolite of sertraline.    Trazodone                      PRESENT      EXPECTED   1,3 chlorophenyl piperazine    PRESENT      EXPECTED    1,3-chlorophenyl piperazine is an expected metabolite of trazodone.    Acetaminophen                  PRESENT      EXPECTED  Drug Absent but Declared for Prescription Verification   Diclofenac                     Not Detected UNEXPECTED    Diclofenac, as indicated in the declared medication list, is not    always detected even when used as directed.  ==================================================================== Test                      Result    Flag   Units      Ref Range   Creatinine              45               mg/dL      >=20 ==================================================================== Declared Medications:  The flagging and interpretation on this report are based on the  following declared medications.  Unexpected results may arise from  inaccuracies in the declared medications.   **Note: The testing scope of this panel includes these medications:   Alprazolam (Xanax)  Oxycodone (Percocet)  Sertraline (Zoloft)  Trazodone (Desyrel)   **Note: The testing scope of this panel does not include small to  moderate amounts of these reported medications:   Acetaminophen (Tylenol)  Acetaminophen (Percocet)  Diclofenac (Voltaren)   **Note: The testing scope of this panel does not include the  following reported medications:   Albuterol (Ventolin HFA)  Apixaban (Eliquis)   Atorvastatin (Lipitor)  Bumetanide (Bumex)  Calcium  Flecainide (Tambocor)  Fluticasone (Trelegy)  Iron  Levothyroxine (Synthroid)  Lisinopril (Zestril)  Loratadine (Claritin)  Pantoprazole (Protonix)  Potassium (Klor-Con)  Ubiquinone (CoQ10)  Umeclidinium (Trelegy)  Vilanterol (Trelegy)  Vitamin D ==================================================================== For clinical consultation, please call [phone]. ====================================================================  ROS  Constitutional: Denies any fever or chills Gastrointestinal: No reported hemesis, hematochezia, vomiting, or acute GI distress Musculoskeletal: Denies any acute onset joint swelling, redness, loss of ROM, or weakness Neurological: No reported episodes of acute onset apraxia, aphasia, dysarthria, agnosia, amnesia, paralysis, loss of coordination, or loss of consciousness  Medication Review  ALPRAZolam, Budeson-Glycopyrrol-Formoterol, Calcium Carbonate-Vitamin D, CoQ10, albuterol, amoxicillin-clavulanate, apixaban, atorvastatin, benzonatate, bumetanide, carvedilol, diclofenac Sodium, empagliflozin, ferrous sulfate, ipratropium-albuterol, levothyroxine, lisinopril, loratadine, ondansetron, oxyCODONE-acetaminophen, pantoprazole, potassium chloride, predniSONE, revefenacin, sertraline, sodium chloride, and traZODone  History Review  Allergy: [name] is allergic to benadryl [diphenhydramine hcl (sleep)], cetirizine, lasix [furosemide], levaquin [levofloxacin in d5w], meloxicam, soy allergy, and sulfa antibiotics. Drug: [name]  reports no history of drug use. Alcohol:  reports no history of alcohol use. Tobacco:  reports that she has quit smoking. Her smoking use included cigarettes. She has never used smokeless tobacco. Social: [name]  reports that she has quit smoking. Her smoking use included cigarettes. She has never used smokeless tobacco. She reports that she does not drink  alcohol and does not use drugs. Medical:  has a past medical history of Aortic stenosis due to bicuspid aortic valve, Aspiration pneumonia ([name]), Atrial fibrillation with RVR ([place]) (01/11/2015), Atrial flutter ([place]), Basal skull fracture ([name]) (20 yrs ago), CHF (congestive heart failure) ([place]), Chronic respiratory failure ([place]), COPD (chronic obstructive pulmonary disease) ([place]), Deafness in left ear, Essential hypertension (01/24/2015), History of cardiac cath, History of stress test, HLD (hyperlipidemia), HTN (hypertension), Hypothyroidism, Obesity, PAF (paroxysmal atrial fibrillation) ([place]), Paroxysmal atrial fibrillation ([place]) (01/24/2015), Right upper quadrant abdominal tenderness without rebound tenderness (03/02/2018), and S/P ascending aortic aneurysm repair (2008). Surgical: [name]  has a past surgical history that includes Abdominal aortic aneurysm repair (2008); Aortic valve replacement (2008); Abdominal hysterectomy; Carpal tunnel release; Tumor excision (Left); Cardiac catheterization; Cardiac catheterization (N/A, 08/17/2016); CARDIOVERSION (N/A, 08/15/2018); CARDIOVERSION (N/A, 11/14/2018); Cardioversion (N/A, 06/05/2019); Cardioversion (N/A, 08/14/2019); Cardioversion (N/A, 11/09/2019); and Cardioversion (N/A, 01/17/2020). Family: family history includes Stroke in her father and paternal grandmother.  Laboratory Chemistry Profile   Renal Lab Results  Component Value Date   BUN 20 08/07/2022   CREATININE 0.86 08/07/2022   BCR 21 03/20/2022   GFRAA >60 05/14/2020   GFRNONAA >60 08/07/2022    Hepatic Lab Results  Component Value Date   AST 18 08/03/2022   ALT 15 08/03/2022   ALBUMIN 3.6 08/03/2022   ALKPHOS 64 08/03/2022   LIPASE 30 07/10/2022    Electrolytes Lab Results  Component Value Date   NA 139 08/07/2022   K 3.5 08/07/2022   CL 102 08/07/2022   CALCIUM 9.0 08/07/2022   MG 2.2 08/07/2022   PHOS 3.5 01/30/2021    Bone Lab Results  Component Value Date   VD25OH 25.5  (L) 11/25/2020   25OHVITD1 31 04/30/2021   25OHVITD2 <1.0 04/30/2021   25OHVITD3 31 04/30/2021    Inflammation (CRP: Acute Phase) (ESR: Chronic Phase) Lab Results  Component Value Date   CRP 6 04/30/2021   ESRSEDRATE 40 04/30/2021   LATICACIDVEN 1.5 01/25/2022         Note: Above Lab results reviewed.  Recent Imaging Review  DG Chest 2 View CLINICAL DATA:  Shortness of breath  EXAM: CHEST - 2 VIEW  COMPARISON:  07/10/2022, chest CT 06/02/2022, 12/02/2021  FINDINGS: Post sternotomy changes and valve prosthesis. Left-sided pacing device. Mild diffuse reticular opacity with hazy ground-glass opacity at the bases likely related to chronic lung disease on prior CT. No confluent acute  airspace disease.  IMPRESSION: Mild diffuse reticular opacity with hazy ground-glass opacity at the bases likely related to chronic lung disease on prior CT. No definite acute superimposed airspace disease  Electronically Signed   By: Donavan Foil M.D.   On: 08/03/2022 21:30 Note: Reviewed        Physical Exam  General appearance: Well nourished, well developed, and well hydrated. In no apparent acute distress Mental status: Alert, oriented x 3 (person, place, & time)       Respiratory: No evidence of acute respiratory distress Eyes: PERLA Vitals: There were no vitals taken for this visit. BMI: Estimated body mass index is 36.72 kg/m as calculated from the following:   Height as of 08/20/22: 5' (1.524 m).   Weight as of 08/20/22: 188 lb (85.3 kg). Ideal: Patient weight not recorded  Assessment   Diagnosis Status  No diagnosis found. Controlled Controlled Controlled   Updated Problems: No problems updated.  Plan of Care  Problem-specific:  No problem-specific Assessment & Plan notes found for this encounter.  [name] has a current medication list which includes the following long-term medication(s): atorvastatin, bumetanide, calcium carbonate-vitamin d, carvedilol,  eliquis, ferrous sulfate, ipratropium-albuterol, levothyroxine, lisinopril, pantoprazole, potassium chloride, sertraline, trazodone, and ventolin hfa.  Pharmacotherapy (Medications Ordered): No orders of the defined types were placed in this encounter.  Orders:  No orders of the defined types were placed in this encounter.  Follow-up plan:   No follow-ups on file.     Interventional Therapies  Risk  Complexity Considerations:   Eliquis Anticoagulation: (Stop: 3 days  Restart: 6 hours) Hard of hearing (HOH) Severe COPD, oxygen dependent.  Difficulty breathing prone (orthopnea). Bioprosthetic Aorctic valve replacement (requires ampicillin 2 g IV prior to procedures) Cardiac pacemaker in situ NO RFA. 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 DISH: (Left C7-T1 calcified ligamentum flavum) (see 10/04/2021 cervical CT Se: 7 Im: 39/81; Se: 9 Im: 56/88; Se: 11 Im: 76/88; Se: 5 Im: 63/76)   Planned  Pending:      Under consideration:   Palliative bilateral trapezius and supraspinatus m. MNB/TPI #3    Completed:   Diagnostic bilateral suprascapular NB x2 (09/25/2021) (100/100/100/100 x 2.5 months)  Therapeutic bilateral Trapezius and supraspinatus MNB x2 (06/16/2022) (100/100/90/90)    Therapeutic  Palliative (PRN) options:   Therapeutic upper back and shoulder (trapezius & supraspinatus) MNB/TPI #2    Pharmacotherapy  Poor candidate for any medication that may induce respiratory depression.       Recent Visits No visits were found meeting these conditions. Showing recent visits within past 90 days and meeting all other requirements Future Appointments Date Type Provider Dept  10/21/22 Appointment Milinda Pointer, MD [facility]  Showing future appointments within next 90 days and meeting all other requirements  I discussed the assessment and treatment plan with the patient. The patient was provided an opportunity to ask questions and all were  answered. The patient agreed with the plan and demonstrated an understanding of the instructions.  Patient advised to call back or seek an in-person evaluation if the symptoms or condition worsens.  Duration of encounter: *** minutes.  Total time on encounter, as per AMA guidelines included both the face-to-face and non-face-to-face time personally spent by the physician and/or other qualified health care professional(s) on the day of the encounter (includes time in activities that require the physician or other qualified health care professional and does not include time in activities normally performed by clinical  staff). Physician's time may include the following activities when performed: Preparing to see the patient (e.g., pre-charting review of records, searching for previously ordered imaging, lab work, and nerve conduction tests) Review of prior analgesic pharmacotherapies. Reviewing PMP Interpreting ordered tests (e.g., lab work, imaging, nerve conduction tests) Performing post-procedure evaluations, including interpretation of diagnostic procedures Obtaining and/or reviewing separately obtained history Performing a medically appropriate examination and/or evaluation Counseling and educating the patient/family/caregiver Ordering medications, tests, or procedures Referring and communicating with other health care professionals (when not separately reported) Documenting clinical information in the electronic or other health record Independently interpreting results (not separately reported) and communicating results to the patient/ family/caregiver Care coordination (not separately reported)  Note by: Gaspar Cola, MD Date: 10/21/2022; Time: 11:44 AM

## 2022-10-19 DIAGNOSIS — I10 Essential (primary) hypertension: Secondary | ICD-10-CM | POA: Diagnosis not present

## 2022-10-19 DIAGNOSIS — I4819 Other persistent atrial fibrillation: Secondary | ICD-10-CM | POA: Diagnosis not present

## 2022-10-19 DIAGNOSIS — T8209XA Other mechanical complication of heart valve prosthesis, initial encounter: Secondary | ICD-10-CM | POA: Diagnosis not present

## 2022-10-19 DIAGNOSIS — Z950 Presence of cardiac pacemaker: Secondary | ICD-10-CM | POA: Diagnosis not present

## 2022-10-19 DIAGNOSIS — Z01810 Encounter for preprocedural cardiovascular examination: Secondary | ICD-10-CM | POA: Diagnosis not present

## 2022-10-19 DIAGNOSIS — I350 Nonrheumatic aortic (valve) stenosis: Secondary | ICD-10-CM | POA: Diagnosis not present

## 2022-10-19 DIAGNOSIS — R0609 Other forms of dyspnea: Secondary | ICD-10-CM | POA: Diagnosis not present

## 2022-10-19 DIAGNOSIS — Q231 Congenital insufficiency of aortic valve: Secondary | ICD-10-CM | POA: Diagnosis not present

## 2022-10-21 ENCOUNTER — Ambulatory Visit: Payer: Medicare Other | Attending: Pain Medicine | Admitting: Pain Medicine

## 2022-10-21 ENCOUNTER — Encounter: Payer: Self-pay | Admitting: Pain Medicine

## 2022-10-21 VITALS — BP 119/67 | HR 76 | Temp 97.5°F | Ht <= 58 in | Wt 188.0 lb

## 2022-10-21 DIAGNOSIS — M79602 Pain in left arm: Secondary | ICD-10-CM | POA: Diagnosis not present

## 2022-10-21 DIAGNOSIS — M4810 Ankylosing hyperostosis [Forestier], site unspecified: Secondary | ICD-10-CM | POA: Diagnosis not present

## 2022-10-21 DIAGNOSIS — M542 Cervicalgia: Secondary | ICD-10-CM | POA: Insufficient documentation

## 2022-10-21 DIAGNOSIS — M25512 Pain in left shoulder: Secondary | ICD-10-CM | POA: Diagnosis not present

## 2022-10-21 DIAGNOSIS — Z7901 Long term (current) use of anticoagulants: Secondary | ICD-10-CM

## 2022-10-21 DIAGNOSIS — M5032 Other cervical disc degeneration, mid-cervical region, unspecified level: Secondary | ICD-10-CM

## 2022-10-21 DIAGNOSIS — M25511 Pain in right shoulder: Secondary | ICD-10-CM | POA: Insufficient documentation

## 2022-10-21 DIAGNOSIS — M5030 Other cervical disc degeneration, unspecified cervical region: Secondary | ICD-10-CM | POA: Insufficient documentation

## 2022-10-21 DIAGNOSIS — M629 Disorder of muscle, unspecified: Secondary | ICD-10-CM | POA: Insufficient documentation

## 2022-10-21 DIAGNOSIS — G8929 Other chronic pain: Secondary | ICD-10-CM | POA: Insufficient documentation

## 2022-10-21 DIAGNOSIS — M79601 Pain in right arm: Secondary | ICD-10-CM | POA: Diagnosis not present

## 2022-10-21 NOTE — Patient Instructions (Signed)
______________________________________________________________________  Procedure instructions  Do not eat or drink fluids (other than water) for 6 hours before your procedure  No water for 2 hours before your procedure  Take your blood pressure medicine with a sip of water  Arrive 30 minutes before your appointment  Carefully read the "Preparing for your procedure" detailed instructions  If you have questions call us at [phone]  _____________________________________________________________________    ______________________________________________________________________  Preparing for your procedure  During your procedure appointment there will be: No Prescription Refills. No disability issues to discussed. No medication changes or discussions.  Instructions: Food intake: Avoid eating anything solid for at least 8 hours prior to your procedure. Clear liquid intake: You may take clear liquids such as water up to 2 hours prior to your procedure. (No carbonated drinks. No soda.) Transportation: Unless otherwise stated by your physician, bring a driver. Morning Medicines: Except for blood thinners, take all of your other morning medications with a sip of water. Make sure to take your heart and blood pressure medicines. If your blood pressure's lower number is above 100, the case will be rescheduled. Blood thinners: Make sure to stop your blood thinners as instructed.  If you take a blood thinner, but were not instructed to stop it, call our office [phone] and ask to talk to a nurse. Not stopping a blood thinner prior to certain procedures could lead to serious complications. Diabetics on insulin: Notify the staff so that you can be scheduled 1st case in the morning. If your diabetes requires high dose insulin, take only  of your normal insulin dose the morning of the procedure and notify the staff that you have done so. Preventing infections: Shower with an  antibacterial soap the morning of your procedure.  Build-up your immune system: Take 1000 mg of Vitamin C with every meal (3 times a day) the day prior to your procedure. Antibiotics: Inform the nursing staff if you are taking any antibiotics or if you have any conditions that may require antibiotics prior to procedures. (Example: recent joint implants)   Pregnancy: If you are pregnant make sure to notify the nursing staff. Not doing so may result in injury to the fetus, including death.  Sickness: If you have a cold, fever, or any active infections, call and cancel or reschedule your procedure. Receiving steroids while having an infection may result in complications. Arrival: You must be in the facility at least 30 minutes prior to your scheduled procedure. Tardiness: Your scheduled time is also the cutoff time. If you do not arrive at least 15 minutes prior to your procedure, you will be rescheduled.  Children: Do not bring any children with you. Make arrangements to keep them home. Dress appropriately: There is always a possibility that your clothing may get soiled. Avoid long dresses. Valuables: Do not bring any jewelry or valuables.  Reasons to call and reschedule or cancel your procedure: (Following these recommendations will minimize the risk of a serious complication.) Surgeries: Avoid having procedures within 2 weeks of any surgery. (Avoid for 2 weeks before or after any surgery). Flu Shots: Avoid having procedures within 2 weeks of a flu shots or . (Avoid for 2 weeks before or after immunizations). Barium: Avoid having a procedure within 7-10 days after having had a radiological study involving the use of radiological contrast. (Myelograms, Barium swallow or enema study). Heart attacks: Avoid any elective procedures or surgeries for the initial 6 months after a "Myocardial Infarction" (Heart Attack). Blood thinners: It  is imperative that you stop these medications before procedures. Let us  know if you if you take any blood thinner.  Infection: Avoid procedures during or within two weeks of an infection (including chest colds or gastrointestinal problems). Symptoms associated with infections include: Localized redness, fever, chills, night sweats or profuse sweating, burning sensation when voiding, cough, congestion, stuffiness, runny nose, sore throat, diarrhea, nausea, vomiting, cold or Flu symptoms, recent or current infections. It is specially important if the infection is over the area that we intend to treat. Heart and lung problems: Symptoms that may suggest an active cardiopulmonary problem include: cough, chest pain, breathing difficulties or shortness of breath, dizziness, ankle swelling, uncontrolled high or unusually low blood pressure, and/or palpitations. If you are experiencing any of these symptoms, cancel your procedure and contact your primary care physician for an evaluation.  Remember:  Regular Business hours are:  Monday to Thursday 8:00 AM to 4:00 PM  Provider's Schedule: Milinda Pointer, MD:  Procedure days: Tuesday and Thursday 7:30 AM to 4:00 PM  Gillis Santa, MD:  Procedure days: Monday and Wednesday 7:30 AM to 4:00 PM  ______________________________________________________________________    ____________________________________________________________________________________________  General Risks and Possible Complications  Patient Responsibilities: It is important that you read this as it is part of your informed consent. It is our duty to inform you of the risks and possible complications associated with treatments offered to you. It is your responsibility as a patient to read this and to ask questions about anything that is not clear or that you believe was not covered in this document.  Patient's Rights: You have the right to refuse treatment. You also have the right to change your mind, even after initially having agreed to have the treatment  done. However, under this last option, if you wait until the last second to change your mind, you may be charged for the materials used up to that point.  Introduction: Medicine is not an Chief Strategy Officer. Everything in Medicine, including the lack of treatment(s), carries the potential for danger, harm, or loss (which is by definition: Risk). In Medicine, a complication is a secondary problem, condition, or disease that can aggravate an already existing one. All treatments carry the risk of possible complications. The fact that a side effects or complications occurs, does not imply that the treatment was conducted incorrectly. It must be clearly understood that these can happen even when everything is done following the highest safety standards.  No treatment: You can choose not to proceed with the proposed treatment alternative. The "PRO(s)" would include: avoiding the risk of complications associated with the therapy. The "CON(s)" would include: not getting any of the treatment benefits. These benefits fall under one of three categories: diagnostic; therapeutic; and/or palliative. Diagnostic benefits include: getting information which can ultimately lead to improvement of the disease or symptom(s). Therapeutic benefits are those associated with the successful treatment of the disease. Finally, palliative benefits are those related to the decrease of the primary symptoms, without necessarily curing the condition (example: decreasing the pain from a flare-up of a chronic condition, such as incurable terminal cancer).  General Risks and Complications: These are associated to most interventional treatments. They can occur alone, or in combination. They fall under one of the following six (6) categories: no benefit or worsening of symptoms; bleeding; infection; nerve damage; allergic reactions; and/or death. No benefits or worsening of symptoms: In Medicine there are no guarantees, only probabilities. No  healthcare provider can ever guarantee that a  medical treatment will work, they can only state the probability that it may. Furthermore, there is always the possibility that the condition may worsen, either directly, or indirectly, as a consequence of the treatment. Bleeding: This is more common if the patient is taking a blood thinner, either prescription or over the counter (example: Goody Powders, Fish oil, Aspirin, Garlic, etc.), or if suffering a condition associated with impaired coagulation (example: Hemophilia, cirrhosis of the liver, low platelet counts, etc.). However, even if you do not have one on these, it can still happen. If you have any of these conditions, or take one of these drugs, make sure to notify your treating physician. Infection: This is more common in patients with a compromised immune system, either due to disease (example: diabetes, cancer, human immunodeficiency virus [HIV], etc.), or due to medications or treatments (example: therapies used to treat cancer and rheumatological diseases). However, even if you do not have one on these, it can still happen. If you have any of these conditions, or take one of these drugs, make sure to notify your treating physician. Nerve Damage: This is more common when the treatment is an invasive one, but it can also happen with the use of medications, such as those used in the treatment of cancer. The damage can occur to small secondary nerves, or to large primary ones, such as those in the spinal cord and brain. This damage may be temporary or permanent and it may lead to impairments that can range from temporary numbness to permanent paralysis and/or brain death. Allergic Reactions: Any time a substance or material comes in contact with our body, there is the possibility of an allergic reaction. These can range from a mild skin rash (contact dermatitis) to a severe systemic reaction (anaphylactic reaction), which can result in death. Death: In  general, any medical intervention can result in death, most of the time due to an unforeseen complication. ____________________________________________________________________________________________    ____________________________________________________________________________________________  Blood Thinners  IMPORTANT NOTICE:  If you take any of these, make sure to notify the nursing staff.  Failure to do so may result in injury.  Recommended time intervals to stop and restart blood-thinners, before & after invasive procedures  Generic Name Brand Name Pre-procedure. Stop this long before procedure. Post-procedure. Minimum waiting period before restarting.  Abciximab Reopro 15 days 2 hrs  Alteplase Activase 10 days 10 days  Anagrelide Agrylin    Apixaban Eliquis 3 days 6 hrs  Cilostazol Pletal 3 days 5 hrs  Clopidogrel Plavix 7-10 days 2 hrs  Dabigatran Pradaxa 5 days 6 hrs  Dalteparin Fragmin 24 hours 4 hrs  Dipyridamole Aggrenox 11days 2 hrs  Edoxaban Lixiana; Savaysa 3 days 2 hrs  Enoxaparin  Lovenox 24 hours 4 hrs  Eptifibatide Integrillin 8 hours 2 hrs  Fondaparinux  Arixtra 72 hours 12 hrs  Hydroxychloroquine Plaquenil 11 days   Prasugrel Effient 7-10 days 6 hrs  Reteplase Retavase 10 days 10 days  Rivaroxaban Xarelto 3 days 6 hrs  Ticagrelor Brilinta 5-7 days 6 hrs  Ticlopidine Ticlid 10-14 days 2 hrs  Tinzaparin Innohep 24 hours 4 hrs  Tirofiban Aggrastat 8 hours 2 hrs  Warfarin Coumadin 5 days 2 hrs   Other medications with blood-thinning effects  Product indications Generic (Brand) names Note  Cholesterol Lipitor Stop 4 days before procedure  Blood thinner (injectable) Heparin (LMW or LMWH Heparin) Stop 24 hours before procedure  Cancer Ibrutinib (Imbruvica) Stop 7 days before procedure  Malaria/Rheumatoid Hydroxychloroquine (Plaquenil) Stop 11 days  before procedure  Thrombolytics  10 days before or after procedures   Over-the-counter (OTC) Products with  blood-thinning effects  Product Common names Stop Time  Aspirin > 325 mg Goody Powders, Excedrin, etc. 11 days  Aspirin ? 81 mg  7 days  Fish oil  4 days  Garlic supplements  7 days  Ginkgo biloba  36 hours  Ginseng  24 hours  NSAIDs Ibuprofen, Naprosyn, etc. 3 days  Vitamin E  4 days   ____________________________________________________________________________________________

## 2022-10-22 ENCOUNTER — Ambulatory Visit: Payer: Medicare Other | Admitting: Nurse Practitioner

## 2022-10-22 DIAGNOSIS — T82221A Breakdown (mechanical) of biological heart valve graft, initial encounter: Secondary | ICD-10-CM | POA: Insufficient documentation

## 2022-10-22 DIAGNOSIS — Z9981 Dependence on supplemental oxygen: Secondary | ICD-10-CM | POA: Insufficient documentation

## 2022-10-22 DIAGNOSIS — I5032 Chronic diastolic (congestive) heart failure: Secondary | ICD-10-CM | POA: Insufficient documentation

## 2022-10-26 ENCOUNTER — Ambulatory Visit (INDEPENDENT_AMBULATORY_CARE_PROVIDER_SITE_OTHER): Payer: Medicare Other | Admitting: Nurse Practitioner

## 2022-10-26 ENCOUNTER — Encounter: Payer: Self-pay | Admitting: Nurse Practitioner

## 2022-10-26 VITALS — BP 130/58 | HR 75 | Temp 97.7°F | Resp 16 | Ht <= 58 in | Wt 188.8 lb

## 2022-10-26 DIAGNOSIS — J9611 Chronic respiratory failure with hypoxia: Secondary | ICD-10-CM | POA: Diagnosis not present

## 2022-10-26 DIAGNOSIS — Z9981 Dependence on supplemental oxygen: Secondary | ICD-10-CM | POA: Diagnosis not present

## 2022-10-26 DIAGNOSIS — J449 Chronic obstructive pulmonary disease, unspecified: Secondary | ICD-10-CM

## 2022-10-26 DIAGNOSIS — G8929 Other chronic pain: Secondary | ICD-10-CM

## 2022-10-26 DIAGNOSIS — M25511 Pain in right shoulder: Secondary | ICD-10-CM

## 2022-10-26 MED ORDER — OXYCODONE-ACETAMINOPHEN 7.5-325 MG PO TABS: ORAL_TABLET | ORAL | 0 refills | Status: DC

## 2022-10-26 NOTE — Progress Notes (Addendum)
[facility] [place], [place] [address]  Internal MEDICINE  Office Visit Note  Patient Name: [name]  [id number]  [id number]  Date of Service: 10/26/2022  Chief Complaint  Patient presents with   Follow-up    HPI [name] presents for a follow up visit for COPD, lung cancer and cardiovascular issues --she has COPD and is on continuous oxygen varying from 3-5 LPM. She was given a week long sample of yupelri but states that it did not help.  --needs a heart valve replacement which is scheduled for next Tuesday which is 8 days from now. She has a lung mass and there are plans for her to get radiation treatment after her heart valve replacement.  She uses noninvasive ventilator at home. This has been helping the patient and decreasing her SOB and wheezing. She does continue to need the NIV.    Current Medication: Outpatient Encounter Medications as of 10/26/2022  Medication Sig   ALPRAZolam (XANAX) 0.25 MG tablet Take 1 tablet (0.25 mg total) by mouth 2 (two) times daily as needed for anxiety.   amoxicillin-clavulanate (AUGMENTIN) 875-125 MG tablet Take 1 tablet by mouth 2 (two) times daily.   atorvastatin (LIPITOR) 40 MG tablet TAKE 1 TABLET BY MOUTH EVERY NIGHT AT BEDTIME   benzonatate (TESSALON) 200 MG capsule Take 1 capsule (200 mg total) by mouth 2 (two) times daily as needed for cough.   Budeson-Glycopyrrol-Formoterol (BREZTRI AEROSPHERE) 160-9-4.8 MCG/ACT AERO Inhale 2 puffs into the lungs 2 (two) times daily.   bumetanide (BUMEX) 1 MG tablet Take 2 tablets (2 mg total) by mouth 2 (two) times daily. And additional 2 tablets PM PRN   Calcium Carbonate-Vitamin D (CALCIUM 600+D PO) Take 1 tablet by mouth daily.   carvedilol (COREG) 6.25 MG tablet Take 1 tablet (6.25 mg total) by mouth 2 (two) times daily with a meal.   Coenzyme Q10 (COQ10) 200 MG CAPS Take 200 mg by mouth daily.   diclofenac Sodium (VOLTAREN) 1 % GEL SMARTSIG:Gram(s) Topical Twice Daily    ELIQUIS 5 MG TABS tablet TAKE 1 TABLET BY MOUTH TWICE DAILY   empagliflozin (JARDIANCE) 10 MG TABS tablet Take 1 tablet (10 mg total) by mouth daily before breakfast.   ferrous sulfate 325 (65 FE) MG tablet Take 1 tablet (325 mg total) by mouth daily with breakfast.   ipratropium-albuterol (DUONEB) 0.5-2.5 (3) MG/3ML SOLN Inhale 3 mLs into the lungs every 4 (four) hours as needed. Inhale 3 mls into the lungs every 4 to 6 hours and as needed   levothyroxine (SYNTHROID) 25 MCG tablet TAKE 1 TABLET BY MOUTH DAILY BEFORE BREAKFAST   lisinopril (ZESTRIL) 40 MG tablet TAKE 1 TABLET(40 MG) BY MOUTH DAILY   loratadine (CLARITIN) 10 MG tablet Take 10 mg by mouth daily.   ondansetron (ZOFRAN-ODT) 4 MG disintegrating tablet Take 1 tablet (4 mg total) by mouth every 8 (eight) hours as needed for nausea or vomiting.   pantoprazole (PROTONIX) 40 MG tablet TAKE 1 TABLET BY MOUTH EVERY DAY   potassium chloride (KLOR-CON) 10 MEQ tablet TAKE 4 TABLETS BY MOUTH TWICE DAILY   predniSONE (DELTASONE) 10 MG tablet Take one tab 3 x day for 3 days, then take one tab 2 x a day for 3 days and then take one tab a day for 3 days for copd   revefenacin (YUPELRI) 175 MCG/3ML nebulizer solution Take 3 mLs (175 mcg total) by nebulization daily.   sertraline (ZOLOFT) 100 MG tablet TAKE 1 TABLET  BY MOUTH ONCE A DAY FOR ANXIETY   sodium chloride (OCEAN) 0.65 % SOLN nasal spray Place 1 spray into both nostrils as needed for congestion.   traZODone (DESYREL) 50 MG tablet TAKE 1 TABLET BY MOUTH EVERY DAY AT BEDTIME   VENTOLIN HFA 108 (90 Base) MCG/ACT inhaler INHALE 2 PUFFS INTO THE LUNGS EVERY 6 HOURS AS NEEDED FOR WHEEZING OR SHORTNESS OF BREATH   [DISCONTINUED] oxyCODONE-acetaminophen (PERCOCET) 7.5-325 MG tablet Take one tab po bid for pain severe   oxyCODONE-acetaminophen (PERCOCET) 7.5-325 MG tablet Take one tab po bid for pain severe   No facility-administered encounter medications on file as of 10/26/2022.    Surgical  History: Past Surgical History:  Procedure Laterality Date   ABDOMINAL AORTIC ANEURYSM REPAIR  2008   ABDOMINAL HYSTERECTOMY     AORTIC VALVE REPLACEMENT  2008   CARDIAC CATHETERIZATION     [place]   CARDIOVERSION N/A 08/15/2018   Procedure: CARDIOVERSION;  Surgeon: [name], MD;  Location: [facility];  Service: Cardiovascular;  Laterality: N/A;   CARDIOVERSION N/A 11/14/2018   Procedure: CARDIOVERSION (CATH LAB);  Surgeon: [name], MD;  Location: [facility];  Service: Cardiovascular;  Laterality: N/A;   CARDIOVERSION N/A 06/05/2019   Procedure: CARDIOVERSION;  Surgeon: [name], MD;  Location: [facility];  Service: Cardiovascular;  Laterality: N/A;   CARDIOVERSION N/A 08/14/2019   Procedure: CARDIOVERSION;  Surgeon: [name], MD;  Location: [place] [facility];  Service: Cardiovascular;  Laterality: N/A;   CARDIOVERSION N/A 11/09/2019   Procedure: CARDIOVERSION;  Surgeon: Isaias Cowman, MD;  Location: [facility];  Service: Cardiovascular;  Laterality: N/A;   CARDIOVERSION N/A 01/17/2020   Procedure: CARDIOVERSION;  Surgeon: Isaias Cowman, MD;  Location: [facility];  Service: Cardiovascular;  Laterality: N/A;   CARPAL TUNNEL RELEASE     ELECTROPHYSIOLOGIC STUDY N/A 08/17/2016   Procedure: Cardioversion;  Surgeon: [name], MD;  Location: [facility];  Service: Cardiovascular;  Laterality: N/A;   TUMOR EXCISION Left    x3 (arm)    Medical History: Past Medical History:  Diagnosis Date   Aortic 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 s/p bioprosthetic valve replacement 2008 at [facility];  b. 01/2015 Echo: EF 60-65%, no rwma, Gr1 DD, mildly dil LA, nl RV fxn.   Aspiration pneumonia ([place])    Atrial fibrillation with RVR ([place]) 01/11/2015   Atrial flutter ([place])    a. 08/2016 s/p DCCV.  Remains on flecainide 50 mg bid.   Basal skull fracture ([name]) 20 yrs ago   CHF (congestive heart failure) ([name])    Chronic respiratory failure ([name])    COPD (chronic obstructive  pulmonary disease) ([place])    a. 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 2008 prior to Aortic aneurysm repair-->nl cors.   History of stress test    a. 10/2015 MV: no ischemia/infarct.   HLD (hyperlipidemia)    HTN (hypertension)    Hypothyroidism    Obesity    PAF (paroxysmal atrial fibrillation) ([name])    a. on Eliquis; b. CHADS2VASc = 3 (HTN, age x 47, female).   Paroxysmal atrial fibrillation ([name]) 01/24/2015   Right upper quadrant abdominal tenderness without rebound tenderness 03/02/2018   S/P ascending aortic aneurysm repair 2008    Family History: Family History  Problem Relation Age  of Onset   Stroke Father    Stroke Paternal Grandmother     Social History   Socioeconomic History   Marital status: Single    Spouse name: Not on file   Number of children: Not on file   Years of education: Not on file   Highest education level: Not on file  Occupational History   Not on file  Tobacco Use   Smoking status: Former    Types: Cigarettes   Smokeless tobacco: Never  Vaping Use   Vaping Use: Never used  Substance and Sexual Activity   Alcohol use: No   Drug use: No   Sexual activity: Never    Birth control/protection: Surgical  Other Topics Concern   Not on file  Social History Narrative   Not on file   Social Determinants of Health   Financial Resource Strain: Low Risk  (08/04/2021)   Overall Financial Resource Strain (CARDIA)    Difficulty of Paying Living Expenses: Not very hard  Food Insecurity: No Food Insecurity (08/05/2022)   Hunger Vital Sign    Worried About Running Out of Food in the Last Year: Never true    Ran Out of Food in the Last Year: Never true  Transportation Needs: No Transportation Needs (08/05/2022)   PRAPARE - Hydrologist (Medical): No    Lack of Transportation (Non-Medical): No  Physical  Activity: Not on file  Stress: Not on file  Social Connections: Not on file  Intimate Partner Violence: Not At Risk (08/05/2022)   Humiliation, Afraid, Rape, and Kick questionnaire    Fear of Current or Ex-Partner: No    Emotionally Abused: No    Physically Abused: No    Sexually Abused: No      Review of Systems  Constitutional:  Positive for activity change and fatigue.  Respiratory:  Positive for cough, chest tightness, shortness of breath and wheezing.        Significant most likely due to the lung mass  Cardiovascular: Negative.  Negative for chest pain and palpitations.  Gastrointestinal:  Positive for abdominal distention and abdominal pain (upper/epigastric). Negative for constipation, diarrhea, nausea and vomiting.  Musculoskeletal:  Positive for arthralgias.  Neurological:  Positive for weakness.  Psychiatric/Behavioral:  Positive for sleep disturbance. Negative for self-injury and suicidal ideas. The patient is nervous/anxious.     Vital Signs: BP (!) 130/58   Pulse 75   Temp 97.7 F (36.5 C)   Resp 16   Ht '4\' 10"'$  (1.473 m)   Wt 188 lb 12.8 oz (85.6 kg)   SpO2 93% Comment: 3L  BMI 39.46 kg/m    Physical Exam Vitals reviewed.  Constitutional:      General: She is not in acute distress.    Appearance: Normal appearance. She is obese. She is ill-appearing.  HENT:     Head: Normocephalic and atraumatic.  Eyes:     Pupils: Pupils are equal, round, and reactive to light.  Cardiovascular:     Rate and Rhythm: Normal rate and regular rhythm.     Heart sounds: Normal heart sounds. No murmur heard. Pulmonary:     Effort: Accessory muscle usage present. No respiratory distress.     Breath sounds: Normal air entry. Examination of the right-upper field reveals wheezing. Examination of the left-upper field reveals wheezing. Wheezing present.  Neurological:     Mental Status: She is alert and oriented to person, place, and time.  Psychiatric:  Mood and Affect:  Mood normal.        Behavior: Behavior normal.        Assessment/Plan: 1. COPD mixed type ([place]) On continuous oxygen and inhalers and nebulizer treatments, continue as prescribed. Discontinue yupelri.   2. Chronic respiratory failure with hypoxia ([name]) Continue supplemental oxygen as prescribed.   3. Chronic right shoulder pain Continue percocet prn as prescribed.  - oxyCODONE-acetaminophen (PERCOCET) 7.5-325 MG tablet; Take one tab po bid for pain severe  Dispense: 60 tablet; Refill: 0  4. Oxygen dependent Continue supplement oxygen used as prescribed.    General Counseling: [name] verbalizes understanding of the findings of todays visit and agrees with plan of treatment. I have discussed any further diagnostic evaluation that may be needed or ordered today. We also reviewed her medications today. she has been encouraged to call the office with any questions or concerns that should arise related to todays visit.    No orders of the defined types were placed in this encounter.   Meds ordered this encounter  Medications   oxyCODONE-acetaminophen (PERCOCET) 7.5-325 MG tablet    Sig: Take one tab po bid for pain severe    Dispense:  60 tablet    Refill:  0    For future refill    Return in about 2 months (around 12/25/2022) for F/U, Shelley Cocke PCP.   Total time spent:30 Minutes Time spent includes review of chart, medications, test results, and follow up plan with the patient.   [place] Controlled Substance Database was reviewed by me.  This patient was seen by Jonetta Osgood, FNP-C in collaboration with Dr. Clayborn Bigness as a part of collaborative care agreement.   Noele Icenhour R. Valetta Fuller, MSN, FNP-C Internal medicine

## 2022-10-28 ENCOUNTER — Other Ambulatory Visit: Payer: Self-pay | Admitting: Family

## 2022-10-30 ENCOUNTER — Encounter: Payer: Self-pay | Admitting: Nurse Practitioner

## 2022-10-30 ENCOUNTER — Encounter: Payer: Self-pay | Admitting: Physician Assistant

## 2022-10-30 ENCOUNTER — Ambulatory Visit (INDEPENDENT_AMBULATORY_CARE_PROVIDER_SITE_OTHER): Payer: Medicare Other | Admitting: Physician Assistant

## 2022-10-30 VITALS — BP 123/101 | HR 71 | Temp 98.4°F | Resp 16 | Ht <= 58 in | Wt 188.0 lb

## 2022-10-30 DIAGNOSIS — H938X1 Other specified disorders of right ear: Secondary | ICD-10-CM | POA: Diagnosis not present

## 2022-10-30 DIAGNOSIS — J00 Acute nasopharyngitis [common cold]: Secondary | ICD-10-CM

## 2022-10-30 NOTE — Progress Notes (Signed)
[facility] [place], [place] [address]  Internal MEDICINE  Office Visit Note  Patient Name: [name]  [id number]  [id number]  Date of Service: 11/06/2022  Chief Complaint  Patient presents with   Ear Pain    Right ear fullness.     HPI Pt is here for a sick visit. -Right ear fullness and popping. Does have some swelling on right nasal passage as well.  -Some pressure behind ear but no redness.  -Going to be having heart valve surgery on  tues -No fevers or chills  Current Medication:  Outpatient Encounter Medications as of 10/30/2022  Medication Sig   ALPRAZolam (XANAX) 0.25 MG tablet Take 1 tablet (0.25 mg total) by mouth 2 (two) times daily as needed for anxiety.   amoxicillin-clavulanate (AUGMENTIN) 875-125 MG tablet Take 1 tablet by mouth 2 (two) times daily.   atorvastatin (LIPITOR) 40 MG tablet TAKE 1 TABLET BY MOUTH EVERY NIGHT AT BEDTIME   benzonatate (TESSALON) 200 MG capsule Take 1 capsule (200 mg total) by mouth 2 (two) times daily as needed for cough.   Budeson-Glycopyrrol-Formoterol (BREZTRI AEROSPHERE) 160-9-4.8 MCG/ACT AERO Inhale 2 puffs into the lungs 2 (two) times daily.   bumetanide (BUMEX) 1 MG tablet Take 2 tablets (2 mg total) by mouth 2 (two) times daily. And additional 2 tablets PM PRN   Calcium Carbonate-Vitamin D (CALCIUM 600+D PO) Take 1 tablet by mouth daily.   carvedilol (COREG) 6.25 MG tablet Take 1 tablet (6.25 mg total) by mouth 2 (two) times daily with a meal.   Coenzyme Q10 (COQ10) 200 MG CAPS Take 200 mg by mouth daily.   diclofenac Sodium (VOLTAREN) 1 % GEL SMARTSIG:Gram(s) Topical Twice Daily   ELIQUIS 5 MG TABS tablet TAKE 1 TABLET BY MOUTH TWICE DAILY   ferrous sulfate 325 (65 FE) MG tablet Take 1 tablet (325 mg total) by mouth daily with breakfast.   ipratropium-albuterol (DUONEB) 0.5-2.5 (3) MG/3ML SOLN Inhale 3 mLs into the lungs every 4 (four) hours as needed. Inhale 3 mls into the lungs every 4 to 6  hours and as needed   JARDIANCE 10 MG TABS tablet TAKE 1 TABLET(10 MG) BY MOUTH DAILY BEFORE BREAKFAST   levothyroxine (SYNTHROID) 25 MCG tablet TAKE 1 TABLET BY MOUTH DAILY BEFORE BREAKFAST   lisinopril (ZESTRIL) 40 MG tablet TAKE 1 TABLET(40 MG) BY MOUTH DAILY   loratadine (CLARITIN) 10 MG tablet Take 10 mg by mouth daily.   ondansetron (ZOFRAN-ODT) 4 MG disintegrating tablet Take 1 tablet (4 mg total) by mouth every 8 (eight) hours as needed for nausea or vomiting.   oxyCODONE-acetaminophen (PERCOCET) 7.5-325 MG tablet Take one tab po bid for pain severe   pantoprazole (PROTONIX) 40 MG tablet TAKE 1 TABLET BY MOUTH EVERY DAY   potassium chloride (KLOR-CON) 10 MEQ tablet TAKE 4 TABLETS BY MOUTH TWICE DAILY   predniSONE (DELTASONE) 10 MG tablet Take one tab 3 x day for 3 days, then take one tab 2 x a day for 3 days and then take one tab a day for 3 days for copd   revefenacin (YUPELRI) 175 MCG/3ML nebulizer solution Take 3 mLs (175 mcg total) by nebulization daily.   sertraline (ZOLOFT) 100 MG tablet TAKE 1 TABLET BY MOUTH ONCE A DAY FOR ANXIETY   sodium chloride (OCEAN) 0.65 % SOLN nasal spray Place 1 spray into both nostrils as needed for congestion.   traZODone (DESYREL) 50 MG tablet TAKE 1 TABLET BY MOUTH EVERY DAY AT  BEDTIME   VENTOLIN HFA 108 (90 Base) MCG/ACT inhaler INHALE 2 PUFFS INTO THE LUNGS EVERY 6 HOURS AS NEEDED FOR WHEEZING OR SHORTNESS OF BREATH   No facility-administered encounter medications on file as of 10/30/2022.      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 s/p bioprosthetic valve replacement 2008 at [facility];  b. 01/2015 Echo: EF 60-65%, no rwma, Gr1 DD, mildly dil LA, nl RV fxn.   Aspiration pneumonia ([place])    Atrial fibrillation with RVR ([place]) 01/11/2015   Atrial flutter ([place])    a. 08/2016 s/p DCCV.  Remains on flecainide 50 mg bid.   Basal skull fracture ([name]) 20 yrs ago   CHF (congestive heart  failure) ([name])    Chronic respiratory failure ([name])    COPD (chronic obstructive pulmonary disease) ([place])    a. 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 2008 prior to Aortic aneurysm repair-->nl cors.   History of stress test    a. 10/2015 MV: no ischemia/infarct.   HLD (hyperlipidemia)    HTN (hypertension)    Hypothyroidism    Obesity    PAF (paroxysmal atrial fibrillation) ([name])    a. on Eliquis; b. CHADS2VASc = 3 (HTN, age x 62, female).   Paroxysmal atrial fibrillation ([name]) 01/24/2015   Right upper quadrant abdominal tenderness without rebound tenderness 03/02/2018   S/P ascending aortic aneurysm repair 2008     Vital Signs: BP (!) 123/101   Pulse 71   Temp 98.4 F (36.9 C)   Resp 16   Ht '4\' 10"'$  (1.473 m)   Wt 188 lb (85.3 kg)   SpO2 94%   BMI 39.29 kg/m    Review of Systems  Constitutional:  Negative for fatigue and fever.  HENT:  Positive for postnasal drip and sinus pressure. Negative for congestion and mouth sores.        Ear fullness/popping  Respiratory:  Negative for cough.   Cardiovascular:  Negative for chest pain.  Genitourinary:  Negative for flank pain.  Psychiatric/Behavioral: Negative.      Physical Exam Vitals and nursing note reviewed.  Constitutional:      Appearance: Normal appearance.  HENT:     Head: Normocephalic and atraumatic.     Right Ear: Tympanic membrane normal.     Left Ear: Tympanic membrane normal.     Nose: Congestion present.  Eyes:     Pupils: Pupils are equal, round, and reactive to light.  Cardiovascular:     Rate and Rhythm: Normal rate and regular rhythm.  Pulmonary:     Breath sounds: Normal breath sounds. No wheezing.  Skin:    General: Skin is warm and dry.  Neurological:     Mental Status: She is alert.  Psychiatric:        Mood and Affect: Mood normal.        Behavior: Behavior normal.         Thought Content: Thought content normal.        Judgment: Judgment normal.       Assessment/Plan: 1. Acute rhinitis Some inflammation and nasal congestion likely impacting Eustachian tube. Will try nasal spray such as flonase to help open sinuses.   2. Popping of right ear Some inflammation and  nasal congestion likely impacting Eustachian tube. Will try nasal spray such as flonase to help open sinuses and take pressure off of ears. No signs of acute infection    General Counseling: Ruchy verbalizes understanding of the findings of todays visit and agrees with plan of treatment. I have discussed any further diagnostic evaluation that may be needed or ordered today. We also reviewed her medications today. she has been encouraged to call the office with any questions or concerns that should arise related to todays visit.    Counseling:    No orders of the defined types were placed in this encounter.   No orders of the defined types were placed in this encounter.   Time spent:25 Minutes

## 2022-11-02 DIAGNOSIS — I480 Paroxysmal atrial fibrillation: Secondary | ICD-10-CM | POA: Diagnosis not present

## 2022-11-02 DIAGNOSIS — M19012 Primary osteoarthritis, left shoulder: Secondary | ICD-10-CM | POA: Diagnosis not present

## 2022-11-02 DIAGNOSIS — F32A Depression, unspecified: Secondary | ICD-10-CM | POA: Diagnosis not present

## 2022-11-02 DIAGNOSIS — I350 Nonrheumatic aortic (valve) stenosis: Secondary | ICD-10-CM | POA: Diagnosis not present

## 2022-11-02 DIAGNOSIS — I4891 Unspecified atrial fibrillation: Secondary | ICD-10-CM | POA: Diagnosis not present

## 2022-11-02 DIAGNOSIS — J90 Pleural effusion, not elsewhere classified: Secondary | ICD-10-CM | POA: Diagnosis not present

## 2022-11-02 DIAGNOSIS — T82221S Breakdown (mechanical) of biological heart valve graft, sequela: Secondary | ICD-10-CM | POA: Diagnosis not present

## 2022-11-02 DIAGNOSIS — F419 Anxiety disorder, unspecified: Secondary | ICD-10-CM | POA: Insufficient documentation

## 2022-11-02 DIAGNOSIS — J9611 Chronic respiratory failure with hypoxia: Secondary | ICD-10-CM | POA: Diagnosis not present

## 2022-11-02 DIAGNOSIS — Z6839 Body mass index (BMI) 39.0-39.9, adult: Secondary | ICD-10-CM | POA: Diagnosis not present

## 2022-11-02 DIAGNOSIS — D649 Anemia, unspecified: Secondary | ICD-10-CM | POA: Diagnosis not present

## 2022-11-02 DIAGNOSIS — K219 Gastro-esophageal reflux disease without esophagitis: Secondary | ICD-10-CM | POA: Diagnosis not present

## 2022-11-02 DIAGNOSIS — Z950 Presence of cardiac pacemaker: Secondary | ICD-10-CM | POA: Diagnosis not present

## 2022-11-02 DIAGNOSIS — C3490 Malignant neoplasm of unspecified part of unspecified bronchus or lung: Secondary | ICD-10-CM | POA: Diagnosis not present

## 2022-11-02 DIAGNOSIS — E785 Hyperlipidemia, unspecified: Secondary | ICD-10-CM | POA: Diagnosis not present

## 2022-11-02 DIAGNOSIS — Z006 Encounter for examination for normal comparison and control in clinical research program: Secondary | ICD-10-CM | POA: Diagnosis not present

## 2022-11-02 DIAGNOSIS — I5032 Chronic diastolic (congestive) heart failure: Secondary | ICD-10-CM | POA: Diagnosis not present

## 2022-11-02 DIAGNOSIS — E039 Hypothyroidism, unspecified: Secondary | ICD-10-CM | POA: Diagnosis not present

## 2022-11-02 DIAGNOSIS — Z952 Presence of prosthetic heart valve: Secondary | ICD-10-CM | POA: Diagnosis not present

## 2022-11-02 DIAGNOSIS — G4733 Obstructive sleep apnea (adult) (pediatric): Secondary | ICD-10-CM | POA: Diagnosis not present

## 2022-11-02 DIAGNOSIS — T82857A Stenosis of cardiac prosthetic devices, implants and grafts, initial encounter: Secondary | ICD-10-CM | POA: Diagnosis not present

## 2022-11-02 DIAGNOSIS — Z8673 Personal history of transient ischemic attack (TIA), and cerebral infarction without residual deficits: Secondary | ICD-10-CM | POA: Diagnosis not present

## 2022-11-02 DIAGNOSIS — K589 Irritable bowel syndrome without diarrhea: Secondary | ICD-10-CM | POA: Diagnosis not present

## 2022-11-02 DIAGNOSIS — T82221A Breakdown (mechanical) of biological heart valve graft, initial encounter: Secondary | ICD-10-CM | POA: Diagnosis not present

## 2022-11-02 DIAGNOSIS — G4730 Sleep apnea, unspecified: Secondary | ICD-10-CM | POA: Diagnosis not present

## 2022-11-02 DIAGNOSIS — Z4682 Encounter for fitting and adjustment of non-vascular catheter: Secondary | ICD-10-CM | POA: Diagnosis not present

## 2022-11-02 DIAGNOSIS — R9431 Abnormal electrocardiogram [ECG] [EKG]: Secondary | ICD-10-CM | POA: Diagnosis not present

## 2022-11-02 DIAGNOSIS — M810 Age-related osteoporosis without current pathological fracture: Secondary | ICD-10-CM | POA: Diagnosis not present

## 2022-11-02 DIAGNOSIS — E669 Obesity, unspecified: Secondary | ICD-10-CM | POA: Diagnosis not present

## 2022-11-02 DIAGNOSIS — J449 Chronic obstructive pulmonary disease, unspecified: Secondary | ICD-10-CM | POA: Diagnosis not present

## 2022-11-02 DIAGNOSIS — Z8679 Personal history of other diseases of the circulatory system: Secondary | ICD-10-CM | POA: Insufficient documentation

## 2022-11-02 DIAGNOSIS — I110 Hypertensive heart disease with heart failure: Secondary | ICD-10-CM | POA: Diagnosis not present

## 2022-11-02 DIAGNOSIS — R54 Age-related physical debility: Secondary | ICD-10-CM | POA: Diagnosis not present

## 2022-11-02 DIAGNOSIS — J439 Emphysema, unspecified: Secondary | ICD-10-CM | POA: Diagnosis not present

## 2022-11-02 DIAGNOSIS — Z87891 Personal history of nicotine dependence: Secondary | ICD-10-CM | POA: Diagnosis not present

## 2022-11-02 DIAGNOSIS — I517 Cardiomegaly: Secondary | ICD-10-CM | POA: Diagnosis not present

## 2022-11-02 DIAGNOSIS — Z9981 Dependence on supplemental oxygen: Secondary | ICD-10-CM | POA: Diagnosis not present

## 2022-11-03 DIAGNOSIS — Z006 Encounter for examination for normal comparison and control in clinical research program: Secondary | ICD-10-CM | POA: Diagnosis not present

## 2022-11-03 DIAGNOSIS — Z952 Presence of prosthetic heart valve: Secondary | ICD-10-CM | POA: Diagnosis not present

## 2022-11-03 DIAGNOSIS — I350 Nonrheumatic aortic (valve) stenosis: Secondary | ICD-10-CM | POA: Diagnosis not present

## 2022-11-06 ENCOUNTER — Telehealth: Payer: Self-pay | Admitting: Family

## 2022-11-06 NOTE — Telephone Encounter (Signed)
Patient called in to state that she will not be able to make her appt on Tuesday 3.5.24 with Darylene Price, [place]. Patient would like for her to know and when we should R/S her for. Please advise.

## 2022-11-09 DIAGNOSIS — H9190 Unspecified hearing loss, unspecified ear: Secondary | ICD-10-CM | POA: Diagnosis not present

## 2022-11-09 DIAGNOSIS — I110 Hypertensive heart disease with heart failure: Secondary | ICD-10-CM | POA: Diagnosis not present

## 2022-11-09 DIAGNOSIS — M1990 Unspecified osteoarthritis, unspecified site: Secondary | ICD-10-CM | POA: Diagnosis not present

## 2022-11-09 DIAGNOSIS — Z950 Presence of cardiac pacemaker: Secondary | ICD-10-CM | POA: Diagnosis not present

## 2022-11-09 DIAGNOSIS — Z5982 Transportation insecurity: Secondary | ICD-10-CM | POA: Diagnosis not present

## 2022-11-09 DIAGNOSIS — F32A Depression, unspecified: Secondary | ICD-10-CM | POA: Diagnosis not present

## 2022-11-09 DIAGNOSIS — F419 Anxiety disorder, unspecified: Secondary | ICD-10-CM | POA: Diagnosis not present

## 2022-11-09 DIAGNOSIS — G4730 Sleep apnea, unspecified: Secondary | ICD-10-CM | POA: Diagnosis not present

## 2022-11-09 DIAGNOSIS — M810 Age-related osteoporosis without current pathological fracture: Secondary | ICD-10-CM | POA: Diagnosis not present

## 2022-11-09 DIAGNOSIS — Z87891 Personal history of nicotine dependence: Secondary | ICD-10-CM | POA: Diagnosis not present

## 2022-11-09 DIAGNOSIS — D649 Anemia, unspecified: Secondary | ICD-10-CM | POA: Diagnosis not present

## 2022-11-09 DIAGNOSIS — K219 Gastro-esophageal reflux disease without esophagitis: Secondary | ICD-10-CM | POA: Diagnosis not present

## 2022-11-09 DIAGNOSIS — Z7901 Long term (current) use of anticoagulants: Secondary | ICD-10-CM | POA: Diagnosis not present

## 2022-11-09 DIAGNOSIS — Z90710 Acquired absence of both cervix and uterus: Secondary | ICD-10-CM | POA: Diagnosis not present

## 2022-11-09 DIAGNOSIS — Z8673 Personal history of transient ischemic attack (TIA), and cerebral infarction without residual deficits: Secondary | ICD-10-CM | POA: Diagnosis not present

## 2022-11-09 DIAGNOSIS — E785 Hyperlipidemia, unspecified: Secondary | ICD-10-CM | POA: Diagnosis not present

## 2022-11-09 DIAGNOSIS — Z974 Presence of external hearing-aid: Secondary | ICD-10-CM | POA: Diagnosis not present

## 2022-11-09 DIAGNOSIS — T82518D Breakdown (mechanical) of other cardiac and vascular devices and implants, subsequent encounter: Secondary | ICD-10-CM | POA: Diagnosis not present

## 2022-11-09 DIAGNOSIS — E039 Hypothyroidism, unspecified: Secondary | ICD-10-CM | POA: Diagnosis not present

## 2022-11-09 DIAGNOSIS — I5022 Chronic systolic (congestive) heart failure: Secondary | ICD-10-CM | POA: Diagnosis not present

## 2022-11-09 DIAGNOSIS — I480 Paroxysmal atrial fibrillation: Secondary | ICD-10-CM | POA: Diagnosis not present

## 2022-11-09 DIAGNOSIS — R918 Other nonspecific abnormal finding of lung field: Secondary | ICD-10-CM | POA: Diagnosis not present

## 2022-11-09 DIAGNOSIS — Z9981 Dependence on supplemental oxygen: Secondary | ICD-10-CM | POA: Diagnosis not present

## 2022-11-09 DIAGNOSIS — G8929 Other chronic pain: Secondary | ICD-10-CM | POA: Diagnosis not present

## 2022-11-09 DIAGNOSIS — J449 Chronic obstructive pulmonary disease, unspecified: Secondary | ICD-10-CM | POA: Diagnosis not present

## 2022-11-10 ENCOUNTER — Telehealth: Payer: Self-pay

## 2022-11-10 ENCOUNTER — Ambulatory Visit: Payer: Medicare Other | Admitting: Family

## 2022-11-10 DIAGNOSIS — D649 Anemia, unspecified: Secondary | ICD-10-CM | POA: Diagnosis not present

## 2022-11-10 DIAGNOSIS — R918 Other nonspecific abnormal finding of lung field: Secondary | ICD-10-CM | POA: Diagnosis not present

## 2022-11-10 DIAGNOSIS — I5022 Chronic systolic (congestive) heart failure: Secondary | ICD-10-CM | POA: Diagnosis not present

## 2022-11-10 DIAGNOSIS — G4730 Sleep apnea, unspecified: Secondary | ICD-10-CM | POA: Diagnosis not present

## 2022-11-10 DIAGNOSIS — E039 Hypothyroidism, unspecified: Secondary | ICD-10-CM | POA: Diagnosis not present

## 2022-11-10 DIAGNOSIS — F32A Depression, unspecified: Secondary | ICD-10-CM | POA: Diagnosis not present

## 2022-11-10 DIAGNOSIS — E785 Hyperlipidemia, unspecified: Secondary | ICD-10-CM | POA: Diagnosis not present

## 2022-11-10 DIAGNOSIS — M810 Age-related osteoporosis without current pathological fracture: Secondary | ICD-10-CM | POA: Diagnosis not present

## 2022-11-10 DIAGNOSIS — T82518D Breakdown (mechanical) of other cardiac and vascular devices and implants, subsequent encounter: Secondary | ICD-10-CM | POA: Diagnosis not present

## 2022-11-10 DIAGNOSIS — I480 Paroxysmal atrial fibrillation: Secondary | ICD-10-CM | POA: Diagnosis not present

## 2022-11-10 DIAGNOSIS — J449 Chronic obstructive pulmonary disease, unspecified: Secondary | ICD-10-CM | POA: Diagnosis not present

## 2022-11-10 DIAGNOSIS — K219 Gastro-esophageal reflux disease without esophagitis: Secondary | ICD-10-CM | POA: Diagnosis not present

## 2022-11-10 DIAGNOSIS — G8929 Other chronic pain: Secondary | ICD-10-CM | POA: Diagnosis not present

## 2022-11-10 DIAGNOSIS — I110 Hypertensive heart disease with heart failure: Secondary | ICD-10-CM | POA: Diagnosis not present

## 2022-11-10 DIAGNOSIS — F419 Anxiety disorder, unspecified: Secondary | ICD-10-CM | POA: Diagnosis not present

## 2022-11-10 DIAGNOSIS — M1990 Unspecified osteoarthritis, unspecified site: Secondary | ICD-10-CM | POA: Diagnosis not present

## 2022-11-10 NOTE — Telephone Encounter (Signed)
Patient's home health representative called to confirm a verbal order for at-home OT once weekly for 6 weeks.

## 2022-11-13 DIAGNOSIS — R918 Other nonspecific abnormal finding of lung field: Secondary | ICD-10-CM | POA: Diagnosis not present

## 2022-11-13 DIAGNOSIS — M1990 Unspecified osteoarthritis, unspecified site: Secondary | ICD-10-CM | POA: Diagnosis not present

## 2022-11-13 DIAGNOSIS — M810 Age-related osteoporosis without current pathological fracture: Secondary | ICD-10-CM | POA: Diagnosis not present

## 2022-11-13 DIAGNOSIS — I5022 Chronic systolic (congestive) heart failure: Secondary | ICD-10-CM | POA: Diagnosis not present

## 2022-11-13 DIAGNOSIS — G4730 Sleep apnea, unspecified: Secondary | ICD-10-CM | POA: Diagnosis not present

## 2022-11-13 DIAGNOSIS — G8929 Other chronic pain: Secondary | ICD-10-CM | POA: Diagnosis not present

## 2022-11-13 DIAGNOSIS — K219 Gastro-esophageal reflux disease without esophagitis: Secondary | ICD-10-CM | POA: Diagnosis not present

## 2022-11-13 DIAGNOSIS — F32A Depression, unspecified: Secondary | ICD-10-CM | POA: Diagnosis not present

## 2022-11-13 DIAGNOSIS — F419 Anxiety disorder, unspecified: Secondary | ICD-10-CM | POA: Diagnosis not present

## 2022-11-13 DIAGNOSIS — I480 Paroxysmal atrial fibrillation: Secondary | ICD-10-CM | POA: Diagnosis not present

## 2022-11-13 DIAGNOSIS — T82518D Breakdown (mechanical) of other cardiac and vascular devices and implants, subsequent encounter: Secondary | ICD-10-CM | POA: Diagnosis not present

## 2022-11-13 DIAGNOSIS — D649 Anemia, unspecified: Secondary | ICD-10-CM | POA: Diagnosis not present

## 2022-11-13 DIAGNOSIS — E785 Hyperlipidemia, unspecified: Secondary | ICD-10-CM | POA: Diagnosis not present

## 2022-11-13 DIAGNOSIS — E039 Hypothyroidism, unspecified: Secondary | ICD-10-CM | POA: Diagnosis not present

## 2022-11-13 DIAGNOSIS — J449 Chronic obstructive pulmonary disease, unspecified: Secondary | ICD-10-CM | POA: Diagnosis not present

## 2022-11-13 DIAGNOSIS — I110 Hypertensive heart disease with heart failure: Secondary | ICD-10-CM | POA: Diagnosis not present

## 2022-11-16 ENCOUNTER — Telehealth: Payer: Self-pay | Admitting: Nurse Practitioner

## 2022-11-16 DIAGNOSIS — D649 Anemia, unspecified: Secondary | ICD-10-CM | POA: Diagnosis not present

## 2022-11-16 DIAGNOSIS — F419 Anxiety disorder, unspecified: Secondary | ICD-10-CM | POA: Diagnosis not present

## 2022-11-16 DIAGNOSIS — I480 Paroxysmal atrial fibrillation: Secondary | ICD-10-CM | POA: Diagnosis not present

## 2022-11-16 DIAGNOSIS — K219 Gastro-esophageal reflux disease without esophagitis: Secondary | ICD-10-CM | POA: Diagnosis not present

## 2022-11-16 DIAGNOSIS — E785 Hyperlipidemia, unspecified: Secondary | ICD-10-CM | POA: Diagnosis not present

## 2022-11-16 DIAGNOSIS — G8929 Other chronic pain: Secondary | ICD-10-CM | POA: Diagnosis not present

## 2022-11-16 DIAGNOSIS — E039 Hypothyroidism, unspecified: Secondary | ICD-10-CM | POA: Diagnosis not present

## 2022-11-16 DIAGNOSIS — T82518D Breakdown (mechanical) of other cardiac and vascular devices and implants, subsequent encounter: Secondary | ICD-10-CM | POA: Diagnosis not present

## 2022-11-16 DIAGNOSIS — I110 Hypertensive heart disease with heart failure: Secondary | ICD-10-CM | POA: Diagnosis not present

## 2022-11-16 DIAGNOSIS — J449 Chronic obstructive pulmonary disease, unspecified: Secondary | ICD-10-CM | POA: Diagnosis not present

## 2022-11-16 DIAGNOSIS — R918 Other nonspecific abnormal finding of lung field: Secondary | ICD-10-CM | POA: Diagnosis not present

## 2022-11-16 DIAGNOSIS — I5022 Chronic systolic (congestive) heart failure: Secondary | ICD-10-CM | POA: Diagnosis not present

## 2022-11-16 DIAGNOSIS — M1990 Unspecified osteoarthritis, unspecified site: Secondary | ICD-10-CM | POA: Diagnosis not present

## 2022-11-16 DIAGNOSIS — G4730 Sleep apnea, unspecified: Secondary | ICD-10-CM | POA: Diagnosis not present

## 2022-11-16 DIAGNOSIS — F32A Depression, unspecified: Secondary | ICD-10-CM | POA: Diagnosis not present

## 2022-11-16 DIAGNOSIS — M810 Age-related osteoporosis without current pathological fracture: Secondary | ICD-10-CM | POA: Diagnosis not present
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
## 2022-11-17 ENCOUNTER — Telehealth: Payer: Self-pay | Admitting: Nurse Practitioner

## 2022-11-17 DIAGNOSIS — M1990 Unspecified osteoarthritis, unspecified site: Secondary | ICD-10-CM | POA: Diagnosis not present

## 2022-11-17 DIAGNOSIS — M810 Age-related osteoporosis without current pathological fracture: Secondary | ICD-10-CM | POA: Diagnosis not present

## 2022-11-17 DIAGNOSIS — I480 Paroxysmal atrial fibrillation: Secondary | ICD-10-CM | POA: Diagnosis not present

## 2022-11-17 DIAGNOSIS — E039 Hypothyroidism, unspecified: Secondary | ICD-10-CM | POA: Diagnosis not present

## 2022-11-17 DIAGNOSIS — F32A Depression, unspecified: Secondary | ICD-10-CM | POA: Diagnosis not present

## 2022-11-17 DIAGNOSIS — G8929 Other chronic pain: Secondary | ICD-10-CM | POA: Diagnosis not present

## 2022-11-17 DIAGNOSIS — K219 Gastro-esophageal reflux disease without esophagitis: Secondary | ICD-10-CM | POA: Diagnosis not present

## 2022-11-17 DIAGNOSIS — E785 Hyperlipidemia, unspecified: Secondary | ICD-10-CM | POA: Diagnosis not present

## 2022-11-17 DIAGNOSIS — I5022 Chronic systolic (congestive) heart failure: Secondary | ICD-10-CM | POA: Diagnosis not present

## 2022-11-17 DIAGNOSIS — T82518D Breakdown (mechanical) of other cardiac and vascular devices and implants, subsequent encounter: Secondary | ICD-10-CM | POA: Diagnosis not present

## 2022-11-17 DIAGNOSIS — J449 Chronic obstructive pulmonary disease, unspecified: Secondary | ICD-10-CM | POA: Diagnosis not present

## 2022-11-17 DIAGNOSIS — R918 Other nonspecific abnormal finding of lung field: Secondary | ICD-10-CM | POA: Diagnosis not present

## 2022-11-17 DIAGNOSIS — F419 Anxiety disorder, unspecified: Secondary | ICD-10-CM | POA: Diagnosis not present

## 2022-11-17 DIAGNOSIS — I110 Hypertensive heart disease with heart failure: Secondary | ICD-10-CM | POA: Diagnosis not present

## 2022-11-17 DIAGNOSIS — D649 Anemia, unspecified: Secondary | ICD-10-CM | POA: Diagnosis not present

## 2022-11-17 DIAGNOSIS — G4730 Sleep apnea, unspecified: Secondary | ICD-10-CM | POA: Diagnosis not present

## 2022-11-17 NOTE — Telephone Encounter (Signed)
Order for 2 extra batteries for portable o2 faxed to [place]; [phone]. Scanned-Toni

## 2022-11-18 DIAGNOSIS — Z950 Presence of cardiac pacemaker: Secondary | ICD-10-CM | POA: Diagnosis not present

## 2022-11-20 ENCOUNTER — Telehealth: Payer: Self-pay | Admitting: Family

## 2022-11-20 NOTE — Telephone Encounter (Signed)
Spoke with pt via phone. Pt stated that she spoke with someone from [place]. Pt stated that her bloated feeling was related to constipation and was given medication to help her have a bm.  Pt denied any CP/SOB at this time. Pt was advised to call EMS if symptoms get worse during the weekend. Pt aware, agreeable, and verbalized understanding  Pt was schedule to see Darylene Price, [facility] Monday 3/18.  Pt aware, agreeable, and verbalized understanding.

## 2022-11-20 NOTE — Telephone Encounter (Signed)
Pt states that since her heart valve replacement (done at [facility]), she has had a tremendous amount of bloating. States "from her abdomen down to her thighs is so bloated.". She also stats that she is losing 1 pound per day. States that she mentioned it to OT/PT and they did not ask her anything at all. I did move her May appt to Monday at 4:00 with Darylene Price, [place]. Please advise patient. CB# [phone] .

## 2022-11-22 NOTE — Progress Notes (Signed)
Patient ID: [name], female    DOB: 1946-03-04, 77 y.o.   MRN: [id number]  HPI  Ms Asante is a 77 y/o female with a history of hyperlipidemia, HTN, thyroid disease, COPD, left ear deafness, atrial fibrillation, previous tobacco use and chronic heart failure.   Echo 11/04/22: EF >55%, moderate LVH Echo 07/24/22: EF of >55% along with mild LVH. Echo 11/11/21: EF of 60-65%. Echo 05/16/21: EF of 45% along with mild LVH. Echo 02/07/20: EF of >55% along with severe LAE.   Cath 10/19/22: Reduced cardiac index at rest with PCWP 13 mmHg.  Moderate increase in PA pressure; PVR 3.9 Wu.  RA pressure 8 mm Hg. No obstructive CAD; left dominant system.   Catheterization done 02/07/20 but unable to view the results.   Admitted 11/02/22 due to TAVR. Admitted 08/04/22 due to worsening shortness of breath with exertion from her baseline associated with increased abdominal girth and orthopnea for about 4 days. Initially given IV lasix with transition to oral diuretics. Cardiology consult obtained. Discharged after 3 days. Was in the ED 07/10/22 due to upper abdominal pain. Serum labs are all normal. Chest x-ray and right upper quadrant ultrasound also unremarkable.  She has known cholelithiasis which is again demonstrated today.    She presents today for a HF follow-up visit with a chief complaint of moderate SOB with minimal exertion. Describes this as chronic in nature. Has associated fatigue, cough, constipation and chronic pain. Denies any difficulty sleeping, abdominal distention, palpitations, pedal edema, chest pain or dizziness.   Had TAVR since she was last here and reports that she is still recovering from this. Has fluctuating weight but also says that she's experienced some constipation and bloating along with this.   Oxygen is at 5L although her portable tank is a pulse tank and only goes to 3L.   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 s/p bioprosthetic  valve replacement 2008 at [facility];  b. 01/2015 Echo: EF 60-65%, no rwma, Gr1 DD, mildly dil LA, nl RV fxn.   Aspiration pneumonia ([place])    Atrial fibrillation with RVR ([place]) 01/11/2015   Atrial flutter ([place])    a. 08/2016 s/p DCCV.  Remains on flecainide 50 mg bid.   Basal skull fracture ([name]) 20 yrs ago   CHF (congestive heart failure) ([name])    Chronic respiratory failure ([name])    COPD (chronic obstructive pulmonary disease) ([place])    a. 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 2008 prior to Aortic aneurysm repair-->nl cors.   History of stress test    a. 10/2015 MV: no ischemia/infarct.   HLD (hyperlipidemia)    HTN (hypertension)    Hypothyroidism    Obesity    PAF (paroxysmal atrial fibrillation) ([name])    a. on Eliquis; b. CHADS2VASc = 3 (HTN, age x 66, female).   Paroxysmal atrial fibrillation ([place]) 01/24/2015   Right upper quadrant abdominal tenderness without rebound tenderness 03/02/2018   S/P ascending aortic aneurysm repair 2008   Past Surgical History:  Procedure Laterality Date   ABDOMINAL AORTIC ANEURYSM REPAIR  2008   ABDOMINAL HYSTERECTOMY     AORTIC VALVE REPLACEMENT  2008   CARDIAC CATHETERIZATION     [facility]   CARDIOVERSION N/A 08/15/2018  Procedure: CARDIOVERSION;  Surgeon: [name], MD;  Location: [facility] ORS;  Service: Cardiovascular;  Laterality: N/A;   CARDIOVERSION N/A 11/14/2018   Procedure: CARDIOVERSION (CATH LAB);  Surgeon: [name], MD;  Location: [place] ORS;  Service: Cardiovascular;  Laterality: N/A;   CARDIOVERSION N/A 06/05/2019   Procedure: CARDIOVERSION;  Surgeon: [name], MD;  Location: [place] [facility];  Service: Cardiovascular;  Laterality: N/A;   CARDIOVERSION N/A 08/14/2019   Procedure: CARDIOVERSION;  Surgeon: [name], MD;  Location: [place] [facility];  Service: Cardiovascular;  Laterality: N/A;   CARDIOVERSION N/A  11/09/2019   Procedure: CARDIOVERSION;  Surgeon: Isaias Cowman, MD;  Location: [place] [facility];  Service: Cardiovascular;  Laterality: N/A;   CARDIOVERSION N/A 01/17/2020   Procedure: CARDIOVERSION;  Surgeon: Isaias Cowman, MD;  Location: [facility] ORS;  Service: Cardiovascular;  Laterality: N/A;   CARPAL TUNNEL RELEASE     ELECTROPHYSIOLOGIC STUDY N/A 08/17/2016   Procedure: Cardioversion;  Surgeon: [name], MD;  Location: [facility] ORS;  Service: Cardiovascular;  Laterality: N/A;   TUMOR EXCISION Left    x3 (arm)   Family History  Problem Relation Age of Onset   Stroke Father    Stroke Paternal Grandmother    Social History   Tobacco Use   Smoking status: Former    Types: Cigarettes   Smokeless tobacco: Never  Substance Use Topics   Alcohol use: No   Allergies  Allergen Reactions   Benadryl [Diphenhydramine Hcl (Sleep)] Palpitations   Cetirizine Palpitations   Lasix [Furosemide] Rash   Levaquin [Levofloxacin In D5w] Other (See Comments)    Reaction:  Fatigue and muscle soreness   Meloxicam Rash   Soy Allergy Hives and Nausea And Vomiting   Sulfa Antibiotics Rash   Prior to Admission medications   Medication Sig Start Date End Date Taking? Authorizing Provider  ALPRAZolam (XANAX) 0.25 MG tablet Take 1 tablet (0.25 mg total) by mouth 2 (two) times daily as needed for anxiety. 10/01/22  Yes Abernathy, [name], NP  atorvastatin (LIPITOR) 40 MG tablet TAKE 1 TABLET BY MOUTH EVERY NIGHT AT BEDTIME 09/28/22  Yes Abernathy, [name], NP  benzonatate (TESSALON) 200 MG capsule Take 1 capsule (200 mg total) by mouth 2 (two) times daily as needed for cough. 09/15/22  Yes Abernathy, Yetta Flock, NP  Budeson-Glycopyrrol-Formoterol (BREZTRI AEROSPHERE) 160-9-4.8 MCG/ACT AERO Inhale 2 puffs into the lungs 2 (two) times daily. 01/22/22  Yes Abernathy, [name], NP  bumetanide (BUMEX) 1 MG tablet Take 2 tablets (2 mg total) by mouth 2 (two) times daily. And additional 2 tablets PM PRN 09/24/22  Yes  Darylene Price A, FNP  Calcium Carbonate-Vitamin D (CALCIUM 600+D PO) Take 1 tablet by mouth daily.   Yes [provider]  carvedilol (COREG) 6.25 MG tablet Take 1 tablet (6.25 mg total) by mouth 2 (two) times daily with a meal. 12/03/21  Yes Dwyane Dee, MD  Coenzyme Q10 (COQ10) 200 MG CAPS Take 200 mg by mouth daily.   Yes [provider]  diclofenac Sodium (VOLTAREN) 1 % GEL SMARTSIG:Gram(s) Topical Twice Daily 09/09/20  Yes [provider]  ELIQUIS 5 MG TABS tablet TAKE 1 TABLET BY MOUTH TWICE DAILY 08/28/22  Yes Abernathy, [name], NP  ferrous sulfate 325 (65 FE) MG tablet Take 1 tablet (325 mg total) by mouth daily with breakfast. 12/03/21  Yes Dwyane Dee, MD  ipratropium-albuterol (DUONEB) 0.5-2.5 (3) MG/3ML SOLN Inhale 3 mLs into the lungs every 4 (four) hours as needed. Inhale 3 mls into the lungs every 4  to 6 hours and as needed 01/29/22  Yes Allyne Gee, MD  JARDIANCE 10 MG TABS tablet TAKE 1 TABLET(10 MG) BY MOUTH DAILY BEFORE BREAKFAST 10/28/22  Yes Darylene Price A, FNP  levothyroxine (SYNTHROID) 25 MCG tablet TAKE 1 TABLET BY MOUTH DAILY BEFORE BREAKFAST 10/02/22  Yes Abernathy, [name], NP  lisinopril (ZESTRIL) 40 MG tablet TAKE 1 TABLET(40 MG) BY MOUTH DAILY 10/08/22  Yes Darylene Price A, FNP  loratadine (CLARITIN) 10 MG tablet Take 10 mg by mouth daily.   Yes [provider]  ondansetron (ZOFRAN-ODT) 4 MG disintegrating tablet Take 1 tablet (4 mg total) by mouth every 8 (eight) hours as needed for nausea or vomiting. 07/10/22  Yes Carrie Mew, MD  oxyCODONE-acetaminophen (PERCOCET) 7.5-325 MG tablet Take one tab po bid for pain severe 10/26/22  Yes Abernathy, [name], NP  pantoprazole (PROTONIX) 40 MG tablet TAKE 1 TABLET BY MOUTH EVERY DAY 09/28/22  Yes Abernathy, [name], NP  potassium chloride (KLOR-CON) 10 MEQ tablet TAKE 4 TABLETS BY MOUTH TWICE DAILY 10/02/22  Yes Abernathy, [name], NP  predniSONE (DELTASONE) 10 MG tablet Take one tab 3 x day  for 3 days, then take one tab 2 x a day for 3 days and then take one tab a day for 3 days for copd 09/14/22  Yes Abernathy, [name], NP  revefenacin (YUPELRI) 175 MCG/3ML nebulizer solution Take 3 mLs (175 mcg total) by nebulization daily. 09/15/22  Yes Abernathy, Yetta Flock, NP  sertraline (ZOLOFT) 100 MG tablet TAKE 1 TABLET BY MOUTH ONCE A DAY FOR ANXIETY 09/20/22  Yes [name], MD  sodium chloride (OCEAN) 0.65 % SOLN nasal spray Place 1 spray into both nostrils as needed for congestion.   Yes [provider]  traZODone (DESYREL) 50 MG tablet TAKE 1 TABLET BY MOUTH EVERY DAY AT BEDTIME 10/02/22  Yes Abernathy, [name], NP  VENTOLIN HFA 108 (90 Base) MCG/ACT inhaler INHALE 2 PUFFS INTO THE LUNGS EVERY 6 HOURS AS NEEDED FOR WHEEZING OR SHORTNESS OF BREATH 06/11/22  Yes [name], MD  amoxicillin-clavulanate (AUGMENTIN) 875-125 MG tablet Take 1 tablet by mouth 2 (two) times daily. 09/14/22   Jonetta Osgood, NP   Review of Systems  Constitutional:  Positive for appetite change (decreased) and fatigue.  HENT:  Positive for hearing loss.   Eyes: Negative.   Respiratory:  Positive for cough (dry) and shortness of breath (easily with exertion).   Cardiovascular:  Negative for chest pain, palpitations and leg swelling.  Gastrointestinal:  Positive for constipation. Negative for abdominal distention and abdominal pain.  Endocrine: Negative.   Genitourinary: Negative.   Musculoskeletal:  Positive for arthralgias (bilateral shoulder pain) and back pain.  Skin: Negative.   Allergic/Immunologic: Negative.   Neurological:  Negative for dizziness and light-headedness.  Hematological:  Negative for adenopathy. Does not bruise/bleed easily.  Psychiatric/Behavioral:  Negative for dysphoric mood and sleep disturbance (trouble sleeping due to shoulder pain; wearing cpap at bedtime). The patient is not nervous/anxious.    Vitals:   11/23/22 1454  BP: 132/63  Pulse: 70  SpO2: 93%  Weight: 188 lb 3.2  oz (85.4 kg)   Wt Readings from Last 3 Encounters:  11/23/22 188 lb 3.2 oz (85.4 kg)  10/30/22 188 lb (85.3 kg)  10/26/22 188 lb 12.8 oz (85.6 kg)   Lab Results  Component Value Date   CREATININE 0.86 08/07/2022   CREATININE 0.96 08/06/2022   CREATININE 0.88 08/05/2022   Physical Exam Vitals and nursing note reviewed.  Constitutional:  Appearance: Normal appearance.  HENT:     Head: Normocephalic and atraumatic.     Left Ear: Decreased hearing noted.  Cardiovascular:     Rate and Rhythm: Normal rate and regular rhythm.     Heart sounds: Murmur heard.  Pulmonary:     Effort: Pulmonary effort is normal. No tachypnea or respiratory distress.     Breath sounds: No wheezing or rales.  Abdominal:     General: There is no distension.     Palpations: Abdomen is soft.  Musculoskeletal:     Cervical back: Normal range of motion and neck supple.     Right lower leg: No edema.     Left lower leg: No edema.  Skin:    General: Skin is warm and dry.  Neurological:     General: No focal deficit present.     Mental Status: She is alert and oriented to person, place, and time.  Psychiatric:        Mood and Affect: Mood is not anxious.        Behavior: Behavior normal.        Thought Content: Thought content normal.    Assessment & Plan:  1: Chronic heart failure with preserved ejection fraction with LVH- - NYHA class III - euvolemic today - weighing daily; reminded to call for an overnight weight gain of >2 pounds or a weekly weight gain of >5 pounds - weight 188.2 pounds today; she did not weigh on the scale at last visit - echo 11/04/22: EF >55%, moderate LVH Echo 07/24/22: EF of >55% along with mild LVH. Echo 11/11/21: EF of 60-65%. Echo 05/16/21: EF of 45% along with mild LVH. Echo 02/07/20: EF of >55% along with severe LAE.  - Cath 10/19/22: Reduced cardiac index at rest with PCWP 13 mmHg.  Moderate increase in PA pressure; PVR 3.9 Wu.  RA pressure 8 mm Hg. No obstructive CAD; left  dominant system. - not adding salt and is using pepper for seasoning; reviewed the importance of closely following a low sodium diet  - carvedilol 6.25mg  BID - jardiance 10mg  daily - lisinopril 40mg  daily - bumex 2mg  BID/ potassium 58meq BID - BNP 08/03/22 was 256.9  2: HTN- - BP 132/63 - saw PCP 10/30/22 - BMP 11/05/22 reviewed and showed sodium 136, potassium 4.1, creatinine 0.7 and GFR 90  3: severe COPD- - wearing oxygen at 5L around the clock at home; when she comes to appointments, she has to put her portable tank on 3L/ this tank is pulse and she doesn't like it as much as her continuous oxygen at home - saw pulmonology Humphrey Rolls) 07/06/22  4: Atrial fibrillation-  - dual chamber pacemaker placed June 2021 - has had multiple cardioversions - apixaban 5mg  BID - saw cardiology Margarito Courser) 08/12/22  5: TAVR- - s/p Wheat and hemiarch procedure with a #34mm CE Magna bovine pericardial valve on 03/30/2007 for bicuspid aortic valve syndrome  - saw cardiothoracic provider Aline Brochure) 10/19/22 - TAVR done 11/02/22  Return in 3 months, sooner if needed.

## 2022-11-23 ENCOUNTER — Encounter: Payer: Self-pay | Admitting: Family

## 2022-11-23 ENCOUNTER — Telehealth: Payer: Self-pay

## 2022-11-23 ENCOUNTER — Ambulatory Visit: Payer: Medicare Other | Attending: Family | Admitting: Family

## 2022-11-23 VITALS — BP 132/63 | HR 70 | Wt 188.2 lb

## 2022-11-23 DIAGNOSIS — I10 Essential (primary) hypertension: Secondary | ICD-10-CM | POA: Diagnosis not present

## 2022-11-23 DIAGNOSIS — G8929 Other chronic pain: Secondary | ICD-10-CM | POA: Diagnosis not present

## 2022-11-23 DIAGNOSIS — E039 Hypothyroidism, unspecified: Secondary | ICD-10-CM | POA: Diagnosis not present

## 2022-11-23 DIAGNOSIS — M810 Age-related osteoporosis without current pathological fracture: Secondary | ICD-10-CM | POA: Diagnosis not present

## 2022-11-23 DIAGNOSIS — Z79899 Other long term (current) drug therapy: Secondary | ICD-10-CM | POA: Diagnosis not present

## 2022-11-23 DIAGNOSIS — Z952 Presence of prosthetic heart valve: Secondary | ICD-10-CM

## 2022-11-23 DIAGNOSIS — R0602 Shortness of breath: Secondary | ICD-10-CM | POA: Insufficient documentation

## 2022-11-23 DIAGNOSIS — I4891 Unspecified atrial fibrillation: Secondary | ICD-10-CM | POA: Insufficient documentation

## 2022-11-23 DIAGNOSIS — Z953 Presence of xenogenic heart valve: Secondary | ICD-10-CM | POA: Insufficient documentation

## 2022-11-23 DIAGNOSIS — Z7901 Long term (current) use of anticoagulants: Secondary | ICD-10-CM | POA: Diagnosis not present

## 2022-11-23 DIAGNOSIS — Z7984 Long term (current) use of oral hypoglycemic drugs: Secondary | ICD-10-CM | POA: Diagnosis not present

## 2022-11-23 DIAGNOSIS — E785 Hyperlipidemia, unspecified: Secondary | ICD-10-CM | POA: Diagnosis not present

## 2022-11-23 DIAGNOSIS — M25511 Pain in right shoulder: Secondary | ICD-10-CM | POA: Diagnosis not present

## 2022-11-23 DIAGNOSIS — I110 Hypertensive heart disease with heart failure: Secondary | ICD-10-CM | POA: Diagnosis not present

## 2022-11-23 DIAGNOSIS — I5022 Chronic systolic (congestive) heart failure: Secondary | ICD-10-CM | POA: Diagnosis not present

## 2022-11-23 DIAGNOSIS — F32A Depression, unspecified: Secondary | ICD-10-CM | POA: Diagnosis not present

## 2022-11-23 DIAGNOSIS — M25512 Pain in left shoulder: Secondary | ICD-10-CM | POA: Diagnosis not present

## 2022-11-23 DIAGNOSIS — Z950 Presence of cardiac pacemaker: Secondary | ICD-10-CM | POA: Insufficient documentation

## 2022-11-23 DIAGNOSIS — Z87891 Personal history of nicotine dependence: Secondary | ICD-10-CM | POA: Insufficient documentation

## 2022-11-23 DIAGNOSIS — I5032 Chronic diastolic (congestive) heart failure: Secondary | ICD-10-CM | POA: Diagnosis not present

## 2022-11-23 DIAGNOSIS — F419 Anxiety disorder, unspecified: Secondary | ICD-10-CM | POA: Diagnosis not present

## 2022-11-23 DIAGNOSIS — K219 Gastro-esophageal reflux disease without esophagitis: Secondary | ICD-10-CM | POA: Diagnosis not present

## 2022-11-23 DIAGNOSIS — R918 Other nonspecific abnormal finding of lung field: Secondary | ICD-10-CM | POA: Diagnosis not present

## 2022-11-23 DIAGNOSIS — I480 Paroxysmal atrial fibrillation: Secondary | ICD-10-CM | POA: Diagnosis not present

## 2022-11-23 DIAGNOSIS — G4730 Sleep apnea, unspecified: Secondary | ICD-10-CM | POA: Diagnosis not present

## 2022-11-23 DIAGNOSIS — K5900 Constipation, unspecified: Secondary | ICD-10-CM | POA: Diagnosis not present

## 2022-11-23 DIAGNOSIS — J449 Chronic obstructive pulmonary disease, unspecified: Secondary | ICD-10-CM | POA: Diagnosis not present

## 2022-11-23 DIAGNOSIS — D649 Anemia, unspecified: Secondary | ICD-10-CM | POA: Diagnosis not present

## 2022-11-23 DIAGNOSIS — M1990 Unspecified osteoarthritis, unspecified site: Secondary | ICD-10-CM | POA: Diagnosis not present

## 2022-11-23 DIAGNOSIS — T82518D Breakdown (mechanical) of other cardiac and vascular devices and implants, subsequent encounter: Secondary | ICD-10-CM | POA: Diagnosis not present

## 2022-11-23 MED ORDER — OXYCODONE-ACETAMINOPHEN 7.5-325 MG PO TABS: ORAL_TABLET | ORAL | 0 refills | Status: DC

## 2022-11-24 ENCOUNTER — Telehealth: Payer: Self-pay

## 2022-11-24 ENCOUNTER — Ambulatory Visit (HOSPITAL_BASED_OUTPATIENT_CLINIC_OR_DEPARTMENT_OTHER): Payer: Medicare Other | Admitting: Pain Medicine

## 2022-11-24 DIAGNOSIS — Z91199 Patient's noncompliance with other medical treatment and regimen due to unspecified reason: Secondary | ICD-10-CM

## 2022-11-24 NOTE — Telephone Encounter (Signed)
Pt.notified

## 2022-11-24 NOTE — Patient Instructions (Signed)

## 2022-11-24 NOTE — Telephone Encounter (Signed)
Faxed order to [facility] for VENTILATOR  re certification

## 2022-11-24 NOTE — Progress Notes (Signed)
(  11/24/2022) Did not stop blood thinner for procedure. Rescheduled.

## 2022-11-25 DIAGNOSIS — J449 Chronic obstructive pulmonary disease, unspecified: Secondary | ICD-10-CM | POA: Diagnosis not present

## 2022-11-25 DIAGNOSIS — D649 Anemia, unspecified: Secondary | ICD-10-CM | POA: Diagnosis not present

## 2022-11-25 DIAGNOSIS — K219 Gastro-esophageal reflux disease without esophagitis: Secondary | ICD-10-CM | POA: Diagnosis not present

## 2022-11-25 DIAGNOSIS — F419 Anxiety disorder, unspecified: Secondary | ICD-10-CM | POA: Diagnosis not present

## 2022-11-25 DIAGNOSIS — G8929 Other chronic pain: Secondary | ICD-10-CM | POA: Diagnosis not present

## 2022-11-25 DIAGNOSIS — M1990 Unspecified osteoarthritis, unspecified site: Secondary | ICD-10-CM | POA: Diagnosis not present

## 2022-11-25 DIAGNOSIS — G4730 Sleep apnea, unspecified: Secondary | ICD-10-CM | POA: Diagnosis not present

## 2022-11-25 DIAGNOSIS — F32A Depression, unspecified: Secondary | ICD-10-CM | POA: Diagnosis not present

## 2022-11-25 DIAGNOSIS — I5022 Chronic systolic (congestive) heart failure: Secondary | ICD-10-CM | POA: Diagnosis not present

## 2022-11-25 DIAGNOSIS — T82518D Breakdown (mechanical) of other cardiac and vascular devices and implants, subsequent encounter: Secondary | ICD-10-CM | POA: Diagnosis not present

## 2022-11-25 DIAGNOSIS — I480 Paroxysmal atrial fibrillation: Secondary | ICD-10-CM | POA: Diagnosis not present

## 2022-11-25 DIAGNOSIS — I110 Hypertensive heart disease with heart failure: Secondary | ICD-10-CM | POA: Diagnosis not present

## 2022-11-25 DIAGNOSIS — E039 Hypothyroidism, unspecified: Secondary | ICD-10-CM | POA: Diagnosis not present

## 2022-11-25 DIAGNOSIS — R918 Other nonspecific abnormal finding of lung field: Secondary | ICD-10-CM | POA: Diagnosis not present

## 2022-11-25 DIAGNOSIS — M810 Age-related osteoporosis without current pathological fracture: Secondary | ICD-10-CM | POA: Diagnosis not present

## 2022-11-25 DIAGNOSIS — E785 Hyperlipidemia, unspecified: Secondary | ICD-10-CM | POA: Diagnosis not present

## 2022-11-27 DIAGNOSIS — Z952 Presence of prosthetic heart valve: Secondary | ICD-10-CM | POA: Diagnosis not present

## 2022-11-27 DIAGNOSIS — I517 Cardiomegaly: Secondary | ICD-10-CM | POA: Diagnosis not present

## 2022-11-27 DIAGNOSIS — R9431 Abnormal electrocardiogram [ECG] [EKG]: Secondary | ICD-10-CM | POA: Diagnosis not present

## 2022-11-27 DIAGNOSIS — Z87891 Personal history of nicotine dependence: Secondary | ICD-10-CM | POA: Diagnosis not present

## 2022-11-27 DIAGNOSIS — Z48812 Encounter for surgical aftercare following surgery on the circulatory system: Secondary | ICD-10-CM | POA: Diagnosis not present

## 2022-11-27 DIAGNOSIS — Z950 Presence of cardiac pacemaker: Secondary | ICD-10-CM | POA: Diagnosis not present

## 2022-11-27 DIAGNOSIS — Q231 Congenital insufficiency of aortic valve: Secondary | ICD-10-CM | POA: Diagnosis not present

## 2022-11-27 DIAGNOSIS — R911 Solitary pulmonary nodule: Secondary | ICD-10-CM | POA: Diagnosis not present

## 2022-11-27 DIAGNOSIS — R918 Other nonspecific abnormal finding of lung field: Secondary | ICD-10-CM | POA: Diagnosis not present

## 2022-11-27 DIAGNOSIS — I7121 Aneurysm of the ascending aorta, without rupture: Secondary | ICD-10-CM | POA: Diagnosis not present

## 2022-11-27 DIAGNOSIS — Z955 Presence of coronary angioplasty implant and graft: Secondary | ICD-10-CM | POA: Diagnosis not present

## 2022-11-30 ENCOUNTER — Telehealth: Payer: Self-pay | Admitting: Internal Medicine

## 2022-11-30 DIAGNOSIS — K219 Gastro-esophageal reflux disease without esophagitis: Secondary | ICD-10-CM | POA: Diagnosis not present

## 2022-11-30 DIAGNOSIS — I110 Hypertensive heart disease with heart failure: Secondary | ICD-10-CM | POA: Diagnosis not present

## 2022-11-30 DIAGNOSIS — J449 Chronic obstructive pulmonary disease, unspecified: Secondary | ICD-10-CM | POA: Diagnosis not present

## 2022-11-30 DIAGNOSIS — G8929 Other chronic pain: Secondary | ICD-10-CM | POA: Diagnosis not present

## 2022-11-30 DIAGNOSIS — F32A Depression, unspecified: Secondary | ICD-10-CM | POA: Diagnosis not present

## 2022-11-30 DIAGNOSIS — M810 Age-related osteoporosis without current pathological fracture: Secondary | ICD-10-CM | POA: Diagnosis not present

## 2022-11-30 DIAGNOSIS — E039 Hypothyroidism, unspecified: Secondary | ICD-10-CM | POA: Diagnosis not present

## 2022-11-30 DIAGNOSIS — I480 Paroxysmal atrial fibrillation: Secondary | ICD-10-CM | POA: Diagnosis not present

## 2022-11-30 DIAGNOSIS — R918 Other nonspecific abnormal finding of lung field: Secondary | ICD-10-CM | POA: Diagnosis not present

## 2022-11-30 DIAGNOSIS — D649 Anemia, unspecified: Secondary | ICD-10-CM | POA: Diagnosis not present

## 2022-11-30 DIAGNOSIS — M1990 Unspecified osteoarthritis, unspecified site: Secondary | ICD-10-CM | POA: Diagnosis not present

## 2022-11-30 DIAGNOSIS — I5022 Chronic systolic (congestive) heart failure: Secondary | ICD-10-CM | POA: Diagnosis not present

## 2022-11-30 DIAGNOSIS — T82518D Breakdown (mechanical) of other cardiac and vascular devices and implants, subsequent encounter: Secondary | ICD-10-CM | POA: Diagnosis not present

## 2022-11-30 DIAGNOSIS — E785 Hyperlipidemia, unspecified: Secondary | ICD-10-CM | POA: Diagnosis not present

## 2022-11-30 DIAGNOSIS — G4730 Sleep apnea, unspecified: Secondary | ICD-10-CM | POA: Diagnosis not present

## 2022-11-30 DIAGNOSIS — F419 Anxiety disorder, unspecified: Secondary | ICD-10-CM | POA: Diagnosis not present

## 2022-11-30 NOTE — Telephone Encounter (Signed)
Received call from [facility] with [facility] home health. She stated patient is now wanting to switch to [place] for her o2 since they have back up tanks. Horris Latino stated she s/w [employer] and they told her the extra batteries for portable we ordered will not last for patient since she is on 5 ltr continuous flow. I explained to her we had already tried to get her switched to [facility] and insurance would not approve. She stated she will have [place] call insurance to see if they will approve switch, then call me back before I start process all over again-Toni

## 2022-12-01 DIAGNOSIS — E785 Hyperlipidemia, unspecified: Secondary | ICD-10-CM | POA: Diagnosis not present

## 2022-12-01 DIAGNOSIS — T82518D Breakdown (mechanical) of other cardiac and vascular devices and implants, subsequent encounter: Secondary | ICD-10-CM | POA: Diagnosis not present

## 2022-12-01 DIAGNOSIS — G8929 Other chronic pain: Secondary | ICD-10-CM | POA: Diagnosis not present

## 2022-12-01 DIAGNOSIS — G4730 Sleep apnea, unspecified: Secondary | ICD-10-CM | POA: Diagnosis not present

## 2022-12-01 DIAGNOSIS — K219 Gastro-esophageal reflux disease without esophagitis: Secondary | ICD-10-CM | POA: Diagnosis not present

## 2022-12-01 DIAGNOSIS — G4733 Obstructive sleep apnea (adult) (pediatric): Secondary | ICD-10-CM | POA: Diagnosis not present

## 2022-12-01 DIAGNOSIS — E039 Hypothyroidism, unspecified: Secondary | ICD-10-CM | POA: Diagnosis not present

## 2022-12-01 DIAGNOSIS — J449 Chronic obstructive pulmonary disease, unspecified: Secondary | ICD-10-CM | POA: Diagnosis not present

## 2022-12-01 DIAGNOSIS — I480 Paroxysmal atrial fibrillation: Secondary | ICD-10-CM | POA: Diagnosis not present

## 2022-12-01 DIAGNOSIS — I5022 Chronic systolic (congestive) heart failure: Secondary | ICD-10-CM | POA: Diagnosis not present

## 2022-12-01 DIAGNOSIS — I110 Hypertensive heart disease with heart failure: Secondary | ICD-10-CM | POA: Diagnosis not present

## 2022-12-01 DIAGNOSIS — M810 Age-related osteoporosis without current pathological fracture: Secondary | ICD-10-CM | POA: Diagnosis not present

## 2022-12-01 DIAGNOSIS — D649 Anemia, unspecified: Secondary | ICD-10-CM | POA: Diagnosis not present

## 2022-12-01 DIAGNOSIS — F32A Depression, unspecified: Secondary | ICD-10-CM | POA: Diagnosis not present

## 2022-12-01 DIAGNOSIS — R918 Other nonspecific abnormal finding of lung field: Secondary | ICD-10-CM | POA: Diagnosis not present

## 2022-12-01 DIAGNOSIS — F419 Anxiety disorder, unspecified: Secondary | ICD-10-CM | POA: Diagnosis not present

## 2022-12-01 DIAGNOSIS — M1990 Unspecified osteoarthritis, unspecified site: Secondary | ICD-10-CM | POA: Diagnosis not present

## 2022-12-07 NOTE — Progress Notes (Unsigned)
PROVIDER NOTE: Interpretation of information contained herein should be left to medically-trained personnel. Specific patient instructions are provided elsewhere under "Patient Instructions" section of medical record. This document was created in part using STT-dictation technology, any transcriptional errors that may result from this process are unintentional.  Patient: [name] Type: Established DOB: 07-06-1946 MRN: [id number] PCP: [name], MD  Service: Procedure DOS: 12/08/2022 Setting: Ambulatory Location: Ambulatory outpatient facility Delivery: Face-to-face Provider: Gaspar Cola, MD Specialty: Interventional Pain Management Specialty designation: 09 Location: Outpatient facility Ref. Prov.: Milinda Pointer, MD       Interventional Therapy   Primary Reason for Visit: Interventional Pain Management Treatment. CC: Shoulder Pain    Procedure:          Anesthesia, Analgesia, Anxiolysis:  Type: Trapezius Trigger Point Injection (1-2 muscle groups) #3  CPT: 20552 Primary Purpose: Therapeutic Region: Upper Cervicothoracic Level: Cervico-thoracic Target Area: Trapezius muscle Trigger Point Approach: Percutaneous, ipsilateral approach. Laterality: Bilateral        Type: Local Anesthesia Local Anesthetic: Lidocaine 1-2% Sedation: None  Indication(s):  Analgesia Route: Infiltration ([place]/IM) IV Access: N/A   Position: Sitting   1. Chronic upper back pain   2. Musculoskeletal disorder involving upper trapezius muscle   3. Trigger point of shoulder region, unspecified laterality   4. Trigger point with neck pain   5. Chronic shoulder pain (1ry area of Pain) (Bilateral) (L>R)   6. Chronic neck pain (3ry area of Pain) (Posterior) (Bilateral) (L>R)   7. Chronic upper extremity pain (2ry area of Pain) (Bilateral) (L>R)   8. DDD (degenerative disc disease), cervical    NAS-11 Pain score:   Pre-procedure: 6 /10   Post-procedure: 0-No pain/10     Pre-op H&P  Assessment:  [name] is a 77 y.o. (year old), female patient, seen today for interventional treatment. She  has a past surgical history that includes Abdominal aortic aneurysm repair (2008); Aortic valve replacement (2008); Abdominal hysterectomy; Carpal tunnel release; Tumor excision (Left); Cardiac catheterization; Cardiac catheterization (N/A, 08/17/2016); CARDIOVERSION (N/A, 08/15/2018); CARDIOVERSION (N/A, 11/14/2018); Cardioversion (N/A, 06/05/2019); Cardioversion (N/A, 08/14/2019); Cardioversion (N/A, 11/09/2019); and Cardioversion (N/A, 01/17/2020). [name] has a current medication list which includes the following prescription(s): alprazolam, amoxicillin-clavulanate, atorvastatin, benzonatate, breztri aerosphere, bumetanide, calcium carbonate-vitamin d, carvedilol, coq10, diclofenac sodium, eliquis, ferrous sulfate, ipratropium-albuterol, jardiance, levothyroxine, lisinopril, loratadine, ondansetron, oxycodone-acetaminophen, pantoprazole, potassium chloride, prednisone, revefenacin, sertraline, sodium chloride, trazodone, and ventolin hfa. Her primarily concern today is the Shoulder Pain  Initial Vital Signs:  Pulse/HCG Rate: 71  Temp: 98.1 F (36.7 C) Resp: 18 BP: 130/64 SpO2: 96 % (4 L of O2)  BMI: Estimated body mass index is 38.67 kg/m as calculated from the following:   Height as of this encounter: 4\' 10"  (1.473 m).   Weight as of this encounter: 185 lb (83.9 kg).  Risk Assessment: Allergies: Reviewed. She is allergic to benadryl [diphenhydramine hcl (sleep)], cetirizine, lasix [furosemide], levaquin [levofloxacin in d5w], meloxicam, soy allergy, and sulfa antibiotics.  Allergy Precautions: None required Coagulopathies: Reviewed. None identified.  Blood-thinner therapy: None at this time Active Infection(s): Reviewed. None identified. [name] is afebrile  Site Confirmation: [name] was asked to confirm the procedure and laterality before marking the site Procedure checklist:  Completed Consent: Before the procedure and under the influence of no sedative(s), amnesic(s), or anxiolytics, the patient was informed of the treatment options, risks and possible complications. To fulfill our ethical and legal obligations, as recommended by the American Medical Association's Code of Ethics, I have  informed the patient of my clinical impression; the nature and purpose of the treatment or procedure; the risks, benefits, and possible complications of the intervention; the alternatives, including doing nothing; the risk(s) and benefit(s) of the alternative treatment(s) or procedure(s); and the risk(s) and benefit(s) of doing nothing. The patient was provided information about the general risks and possible complications associated with the procedure. These may include, but are not limited to: failure to achieve desired goals, infection, bleeding, organ or nerve damage, allergic reactions, paralysis, and death. In addition, the patient was informed of those risks and complications associated to the procedure, such as failure to decrease pain; infection; bleeding; organ or nerve damage with subsequent damage to sensory, motor, and/or autonomic systems, resulting in permanent pain, numbness, and/or weakness of one or several areas of the body; allergic reactions; (i.e.: anaphylactic reaction); and/or death. Furthermore, the patient was informed of those risks and complications associated with the medications. These include, but are not limited to: allergic reactions (i.e.: anaphylactic or anaphylactoid reaction(s)); adrenal axis suppression; blood sugar elevation that in diabetics may result in ketoacidosis or comma; water retention that in patients with history of congestive heart failure may result in shortness of breath, pulmonary edema, and decompensation with resultant heart failure; weight gain; swelling or edema; medication-induced neural toxicity; particulate matter embolism and blood vessel  occlusion with resultant organ, and/or nervous system infarction; and/or aseptic necrosis of one or more joints. Finally, the patient was informed that Medicine is not an exact science; therefore, there is also the possibility of unforeseen or unpredictable risks and/or possible complications that may result in a catastrophic outcome. The patient indicated having understood very clearly. We have given the patient no guarantees and we have made no promises. Enough time was given to the patient to ask questions, all of which were answered to the patient's satisfaction. Ms. Stickel has indicated that she wanted to continue with the procedure. Attestation: I, the ordering provider, attest that I have discussed with the patient the benefits, risks, side-effects, alternatives, likelihood of achieving goals, and potential problems during recovery for the procedure that I have provided informed consent. Date  Time: 12/08/2022 10:33 AM  Pre-Procedure Preparation:  Monitoring: As per clinic protocol. Respiration, ETCO2, SpO2, BP, heart rate and rhythm monitor placed and checked for adequate function Safety Precautions: Patient was assessed for positional comfort and pressure points before starting the procedure. Time-out: I initiated and conducted the "Time-out" before starting the procedure, as per protocol. The patient was asked to participate by confirming the accuracy of the "Time Out" information. Verification of the correct person, site, and procedure were performed and confirmed by me, the nursing staff, and the patient. "Time-out" conducted as per Joint Commission's Universal Protocol (UP.01.01.01). Time: 1113 Start Time: 1114 hrs.  Description of Procedure:          Area Prepped: Entire Upper Back Region DuraPrep (Iodine Povacrylex [0.7% available iodine] and Isopropyl Alcohol, 74% w/w) Safety Precautions: Aspiration looking for blood return was conducted prior to all injections. At no point did we inject  any substances, as a needle was being advanced. No attempts were made at seeking any paresthesias. Safe injection practices and needle disposal techniques used. Medications properly checked for expiration dates. SDV (single dose vial) medications used. Description of the Procedure: Protocol guidelines were followed. The patient was placed in position over the fluoroscopy table. The target area was identified and the area prepped in the usual manner. Skin & deeper tissues infiltrated with local anesthetic. Appropriate  amount of time allowed to pass for local anesthetics to take effect. The procedure needles were then advanced to the target area. Proper needle placement secured. Negative aspiration confirmed. Solution injected in intermittent fashion, asking for systemic symptoms every 0.5cc of injectate. The needles were then removed and the area cleansed, making sure to leave some of the prepping solution back to take advantage of its long term bactericidal properties.  Vitals:   12/08/22 1029  BP: 130/64  Pulse: 71  Resp: 18  Temp: 98.1 F (36.7 C)  TempSrc: Temporal  SpO2: 96%  Weight: 185 lb (83.9 kg)  Height: 4\' 10"  (1.473 m)    Start Time: 1114 hrs. End Time: 1115 hrs. Materials:  Needle(s) Type: Regular needle Gauge: 22G Length: 1.5-in Medication(s): Please see orders for medications and dosing details.  Imaging Guidance:          Type of Imaging Technique: None used Indication(s): N/A Exposure Time: No patient exposure Contrast: None used. Fluoroscopic Guidance: N/A Ultrasound Guidance: N/A Interpretation: N/A  Antibiotic Prophylaxis:   Anti-infectives (From admission, onward)    None      Indication(s): None identified  Post-operative Assessment:  Post-procedure Vital Signs:  Pulse/HCG Rate: 71  Temp: 98.1 F (36.7 C) Resp: 18 BP: 130/64 SpO2: 96 % (4 L of O2)  EBL: None  Complications: No immediate post-treatment complications observed by team, or  reported by patient.  Note: The patient tolerated the entire procedure well. A repeat set of vitals were taken after the procedure and the patient was kept under observation following institutional policy, for this type of procedure. Post-procedural neurological assessment was performed, showing return to baseline, prior to discharge. The patient was provided with post-procedure discharge instructions, including a section on how to identify potential problems. Should any problems arise concerning this procedure, the patient was given instructions to immediately contact us, at any time, without hesitation. In any case, we plan to contact the patient by telephone for a follow-up status report regarding this interventional procedure.  Comments:  No additional relevant information.  Plan of Care (POC)  Orders:  Orders Placed This Encounter  Procedures   TRIGGER POINT INJECTION    Scheduling Instructions:     Area: Upper Back     Side: Bilateral     Sedation: No Sedation.     Timeframe: Today    Order Specific Question:   Where will this procedure be performed?    Answer:   [facility]   Informed Consent Details: Physician/Practitioner Attestation; Transcribe to consent form and obtain patient signature    Provider Attestation: I, [place] Dossie Arbour, MD, (Pain Management Specialist), the physician/practitioner, attest that I have discussed with the patient the benefits, risks, side effects, alternatives, likelihood of achieving goals and potential problems during recovery for the procedure that I have provided informed consent.    Scheduling Instructions:     Note: Always confirm laterality of pain with Ms. Wolfman, before procedure.     Transcribe to consent form and obtain patient signature.    Order Specific Question:   Physician/Practitioner attestation of informed consent for procedure/surgical case    Answer:   I, the physician/practitioner, attest that I have discussed with the  patient the benefits, risks, side effects, alternatives, likelihood of achieving goals and potential problems during recovery for the procedure that I have provided informed consent.    Order Specific Question:   Procedure    Answer:   Myoneural Block (Trigger Point injection)  Order Specific Question:   Physician/Practitioner performing the procedure    Answer:   Charbel [place] A. Dossie Arbour MD    Order Specific Question:   Indication/Reason    Answer:   Musculoskeletal pain/myofascial pain secondary to trigger point   Care order/instruction: Please confirm that the patient has stopped the Eliquis (Apixaban) x 3 days prior to procedure or surgery.    Please confirm that the patient has stopped the Eliquis (Apixaban) x 3 days prior to procedure or surgery.    Standing Status:   Standing    Number of Occurrences:   1   Provide equipment / supplies at bedside    Procedure tray: "Block Tray" (Disposable  single use) Skin infiltration needle: Regular 1.5-in, 25-G, (x1) Block Needle type: Regular Amount/quantity: 1 Size: Short(1.5-inch) Gauge: 22G    Standing Status:   Standing    Number of Occurrences:   1    Order Specific Question:   Specify    Answer:   Block Tray   Bleeding precautions    Standing Status:   Standing    Number of Occurrences:   1   Chronic Opioid Analgesic:  Oxycodone/APAP 7.5/325, 1 tab p.o. daily (# 45) (last filled on 06/02/2021) by Erich Montane, FNP-C, working for Dr. Talmadge Chad, MD. MME/day: 7.5 mg/day   Medications ordered for procedure: Meds ordered this encounter  Medications   ropivacaine (PF) 2 mg/mL (0.2%) (NAROPIN) injection 9 mL   triamcinolone acetonide (KENALOG-40) injection 40 mg   pentafluoroprop-tetrafluoroeth (GEBAUERS) aerosol   Medications administered: We administered ropivacaine (PF) 2 mg/mL (0.2%), triamcinolone acetonide, and pentafluoroprop-tetrafluoroeth.  See the medical record for exact dosing, route, and time of  administration.  Follow-up plan:   Return in about 2 weeks (around 12/22/2022) for Proc-day (T,Th), (Face2F), (PPE).       Interventional Therapies  Risk  Complexity Considerations:   Hard of hearing (HOH)  COPD  O2 dependent  positional orthopnea  Eliquis Anticoagulation: (Stop: 3 days  Restart: 6 hours)  A-fib  prosthetic AoV (Prophylactic ampicillin 2g IV)  Cardiac pacemaker     Planned  Pending:   Palliative bilateral trapezius + supraspinatus m. MNB/TPI #3    Under consideration:   Palliative bilateral trapezius + supraspinatus m. MNB/TPI #3    Completed:   Diagnostic bilateral suprascapular NB x2 (09/25/2021) (100/100/100/100 x 2.5 months)  Therapeutic bilateral Trapezius and supraspinatus MNB x2 (06/16/2022) (100/100/90/90)    Therapeutic  Palliative (PRN) options:   Therapeutic upper back and shoulder (trapezius & supraspinatus) MNB/TPI    Pharmacotherapy  Poor candidate for any medication that may induce respiratory depression.        Recent Visits Date Type Provider Dept  10/21/22 Office Visit Milinda Pointer, MD [facility]  Showing recent visits within past 90 days and meeting all other requirements Today's Visits Date Type Provider Dept  12/08/22 Procedure visit Milinda Pointer, MD [facility]  Showing today's visits and meeting all other requirements Future Appointments Date Type Provider Dept  12/29/22 Appointment Milinda Pointer, MD [facility]  Showing future appointments within next 90 days and meeting all other requirements  Disposition: Discharge home  Discharge (Date  Time): 12/08/2022; 1120 hrs.   Primary Care Physician: [name], MD Location: [facility] Note by: Gaspar Cola, MD (TTS technology used. I apologize for any typographical errors that were not detected and corrected.) Date: 12/08/2022; Time: 12:07 PM  Disclaimer:  Medicine is not an Armed forces logistics/support/administrative officer.  The only guarantee in medicine is that nothing is guaranteed. It is important to note that the decision to proceed with this intervention was based on the information collected from the patient. The Data and conclusions were drawn from the patient's questionnaire, the interview, and the physical examination. Because the information was provided in large part by the patient, it cannot be guaranteed that it has not been purposely or unconsciously manipulated. Every effort has been made to obtain as much relevant data as possible for this evaluation. It is important to note that the conclusions that lead to this procedure are derived in large part from the available data. Always take into account that the treatment will also be dependent on availability of resources and existing treatment guidelines, considered by other Pain Management Practitioners as being common knowledge and practice, at the time of the intervention. For Medico-Legal purposes, it is also important to point out that variation in procedural techniques and pharmacological choices are the acceptable norm. The indications, contraindications, technique, and results of the above procedure should only be interpreted and judged by a Board-Certified Interventional Pain Specialist with extensive familiarity and expertise in the same exact procedure and technique.

## 2022-12-08 ENCOUNTER — Ambulatory Visit: Payer: Medicare Other | Attending: Pain Medicine | Admitting: Pain Medicine

## 2022-12-08 ENCOUNTER — Encounter: Payer: Self-pay | Admitting: Pain Medicine

## 2022-12-08 VITALS — BP 130/64 | HR 71 | Temp 98.1°F | Resp 18 | Ht <= 58 in | Wt 185.0 lb

## 2022-12-08 DIAGNOSIS — M549 Dorsalgia, unspecified: Secondary | ICD-10-CM | POA: Insufficient documentation

## 2022-12-08 DIAGNOSIS — M25512 Pain in left shoulder: Secondary | ICD-10-CM | POA: Insufficient documentation

## 2022-12-08 DIAGNOSIS — M7912 Myalgia of auxiliary muscles, head and neck: Secondary | ICD-10-CM | POA: Diagnosis not present

## 2022-12-08 DIAGNOSIS — G8929 Other chronic pain: Secondary | ICD-10-CM

## 2022-12-08 DIAGNOSIS — M7918 Myalgia, other site: Secondary | ICD-10-CM | POA: Diagnosis not present

## 2022-12-08 DIAGNOSIS — M79601 Pain in right arm: Secondary | ICD-10-CM | POA: Diagnosis not present

## 2022-12-08 DIAGNOSIS — M25511 Pain in right shoulder: Secondary | ICD-10-CM | POA: Diagnosis not present

## 2022-12-08 DIAGNOSIS — M25519 Pain in unspecified shoulder: Secondary | ICD-10-CM | POA: Diagnosis not present

## 2022-12-08 DIAGNOSIS — M629 Disorder of muscle, unspecified: Secondary | ICD-10-CM

## 2022-12-08 DIAGNOSIS — M542 Cervicalgia: Secondary | ICD-10-CM

## 2022-12-08 DIAGNOSIS — M5030 Other cervical disc degeneration, unspecified cervical region: Secondary | ICD-10-CM | POA: Diagnosis not present

## 2022-12-08 DIAGNOSIS — M79602 Pain in left arm: Secondary | ICD-10-CM | POA: Diagnosis not present

## 2022-12-08 MED ORDER — ROPIVACAINE HCL 2 MG/ML IJ SOLN
9.0000 mL | Freq: Once | INTRAMUSCULAR | Status: AC
  Administered 2022-12-08: 20 mL
  Filled 2022-12-08: qty 20

## 2022-12-08 MED ORDER — TRIAMCINOLONE ACETONIDE 40 MG/ML IJ SUSP
40.0000 mg | Freq: Once | INTRAMUSCULAR | Status: AC
  Administered 2022-12-08: 80 mg
  Filled 2022-12-08: qty 1

## 2022-12-08 MED ORDER — PENTAFLUOROPROP-TETRAFLUOROETH EX AERO
INHALATION_SPRAY | Freq: Once | CUTANEOUS | Status: AC
  Administered 2022-12-08: 30 via TOPICAL
  Filled 2022-12-08: qty 30

## 2022-12-08 NOTE — Patient Instructions (Signed)

## 2022-12-08 NOTE — Progress Notes (Signed)
Safety precautions to be maintained throughout the outpatient stay will include: orient to surroundings, keep bed in low position, maintain call bell within reach at all times, provide assistance with transfer out of bed and ambulation.  

## 2022-12-09 ENCOUNTER — Telehealth: Payer: Self-pay | Admitting: *Deleted

## 2022-12-09 DIAGNOSIS — G8929 Other chronic pain: Secondary | ICD-10-CM | POA: Diagnosis not present

## 2022-12-09 DIAGNOSIS — M1990 Unspecified osteoarthritis, unspecified site: Secondary | ICD-10-CM | POA: Diagnosis not present

## 2022-12-09 DIAGNOSIS — E039 Hypothyroidism, unspecified: Secondary | ICD-10-CM | POA: Diagnosis not present

## 2022-12-09 DIAGNOSIS — R918 Other nonspecific abnormal finding of lung field: Secondary | ICD-10-CM | POA: Diagnosis not present

## 2022-12-09 DIAGNOSIS — Z90710 Acquired absence of both cervix and uterus: Secondary | ICD-10-CM | POA: Diagnosis not present

## 2022-12-09 DIAGNOSIS — D649 Anemia, unspecified: Secondary | ICD-10-CM | POA: Diagnosis not present

## 2022-12-09 DIAGNOSIS — Z950 Presence of cardiac pacemaker: Secondary | ICD-10-CM | POA: Diagnosis not present

## 2022-12-09 DIAGNOSIS — Z8673 Personal history of transient ischemic attack (TIA), and cerebral infarction without residual deficits: Secondary | ICD-10-CM | POA: Diagnosis not present

## 2022-12-09 DIAGNOSIS — I110 Hypertensive heart disease with heart failure: Secondary | ICD-10-CM | POA: Diagnosis not present

## 2022-12-09 DIAGNOSIS — E785 Hyperlipidemia, unspecified: Secondary | ICD-10-CM | POA: Diagnosis not present

## 2022-12-09 DIAGNOSIS — Z87891 Personal history of nicotine dependence: Secondary | ICD-10-CM | POA: Diagnosis not present

## 2022-12-09 DIAGNOSIS — T82518D Breakdown (mechanical) of other cardiac and vascular devices and implants, subsequent encounter: Secondary | ICD-10-CM | POA: Diagnosis not present

## 2022-12-09 DIAGNOSIS — G4730 Sleep apnea, unspecified: Secondary | ICD-10-CM | POA: Diagnosis not present

## 2022-12-09 DIAGNOSIS — Z9981 Dependence on supplemental oxygen: Secondary | ICD-10-CM | POA: Diagnosis not present

## 2022-12-09 DIAGNOSIS — I480 Paroxysmal atrial fibrillation: Secondary | ICD-10-CM | POA: Diagnosis not present

## 2022-12-09 DIAGNOSIS — F419 Anxiety disorder, unspecified: Secondary | ICD-10-CM | POA: Diagnosis not present

## 2022-12-09 DIAGNOSIS — F32A Depression, unspecified: Secondary | ICD-10-CM | POA: Diagnosis not present

## 2022-12-09 DIAGNOSIS — H9190 Unspecified hearing loss, unspecified ear: Secondary | ICD-10-CM | POA: Diagnosis not present

## 2022-12-09 DIAGNOSIS — Z5982 Transportation insecurity: Secondary | ICD-10-CM | POA: Diagnosis not present

## 2022-12-09 DIAGNOSIS — M810 Age-related osteoporosis without current pathological fracture: Secondary | ICD-10-CM | POA: Diagnosis not present

## 2022-12-09 DIAGNOSIS — Z974 Presence of external hearing-aid: Secondary | ICD-10-CM | POA: Diagnosis not present

## 2022-12-09 DIAGNOSIS — I5022 Chronic systolic (congestive) heart failure: Secondary | ICD-10-CM | POA: Diagnosis not present

## 2022-12-09 DIAGNOSIS — Z7901 Long term (current) use of anticoagulants: Secondary | ICD-10-CM | POA: Diagnosis not present

## 2022-12-09 DIAGNOSIS — K219 Gastro-esophageal reflux disease without esophagitis: Secondary | ICD-10-CM | POA: Diagnosis not present

## 2022-12-09 DIAGNOSIS — J449 Chronic obstructive pulmonary disease, unspecified: Secondary | ICD-10-CM | POA: Diagnosis not present

## 2022-12-09 NOTE — Telephone Encounter (Signed)
No problems post procedure. 

## 2022-12-11 ENCOUNTER — Telehealth: Payer: Self-pay

## 2022-12-11 NOTE — Telephone Encounter (Signed)
[name] from [facility] [phone] called that pt refused for occupational therapy

## 2022-12-14 DIAGNOSIS — G8929 Other chronic pain: Secondary | ICD-10-CM | POA: Diagnosis not present

## 2022-12-14 DIAGNOSIS — E039 Hypothyroidism, unspecified: Secondary | ICD-10-CM | POA: Diagnosis not present

## 2022-12-14 DIAGNOSIS — I480 Paroxysmal atrial fibrillation: Secondary | ICD-10-CM | POA: Diagnosis not present

## 2022-12-14 DIAGNOSIS — T82518D Breakdown (mechanical) of other cardiac and vascular devices and implants, subsequent encounter: Secondary | ICD-10-CM | POA: Diagnosis not present

## 2022-12-14 DIAGNOSIS — I5022 Chronic systolic (congestive) heart failure: Secondary | ICD-10-CM | POA: Diagnosis not present

## 2022-12-14 DIAGNOSIS — I110 Hypertensive heart disease with heart failure: Secondary | ICD-10-CM | POA: Diagnosis not present

## 2022-12-14 DIAGNOSIS — F32A Depression, unspecified: Secondary | ICD-10-CM | POA: Diagnosis not present

## 2022-12-14 DIAGNOSIS — E785 Hyperlipidemia, unspecified: Secondary | ICD-10-CM | POA: Diagnosis not present

## 2022-12-14 DIAGNOSIS — R918 Other nonspecific abnormal finding of lung field: Secondary | ICD-10-CM | POA: Diagnosis not present

## 2022-12-14 DIAGNOSIS — G4730 Sleep apnea, unspecified: Secondary | ICD-10-CM | POA: Diagnosis not present

## 2022-12-14 DIAGNOSIS — M1990 Unspecified osteoarthritis, unspecified site: Secondary | ICD-10-CM | POA: Diagnosis not present

## 2022-12-14 DIAGNOSIS — F419 Anxiety disorder, unspecified: Secondary | ICD-10-CM | POA: Diagnosis not present

## 2022-12-14 DIAGNOSIS — K219 Gastro-esophageal reflux disease without esophagitis: Secondary | ICD-10-CM | POA: Diagnosis not present

## 2022-12-14 DIAGNOSIS — M810 Age-related osteoporosis without current pathological fracture: Secondary | ICD-10-CM | POA: Diagnosis not present

## 2022-12-14 DIAGNOSIS — J449 Chronic obstructive pulmonary disease, unspecified: Secondary | ICD-10-CM | POA: Diagnosis not present

## 2022-12-14 DIAGNOSIS — D649 Anemia, unspecified: Secondary | ICD-10-CM | POA: Diagnosis not present

## 2022-12-21 DIAGNOSIS — D649 Anemia, unspecified: Secondary | ICD-10-CM | POA: Diagnosis not present

## 2022-12-21 DIAGNOSIS — J449 Chronic obstructive pulmonary disease, unspecified: Secondary | ICD-10-CM | POA: Diagnosis not present

## 2022-12-21 DIAGNOSIS — G4730 Sleep apnea, unspecified: Secondary | ICD-10-CM | POA: Diagnosis not present

## 2022-12-21 DIAGNOSIS — M810 Age-related osteoporosis without current pathological fracture: Secondary | ICD-10-CM | POA: Diagnosis not present

## 2022-12-21 DIAGNOSIS — I5022 Chronic systolic (congestive) heart failure: Secondary | ICD-10-CM | POA: Diagnosis not present

## 2022-12-21 DIAGNOSIS — F32A Depression, unspecified: Secondary | ICD-10-CM | POA: Diagnosis not present

## 2022-12-21 DIAGNOSIS — T82518D Breakdown (mechanical) of other cardiac and vascular devices and implants, subsequent encounter: Secondary | ICD-10-CM | POA: Diagnosis not present

## 2022-12-21 DIAGNOSIS — E785 Hyperlipidemia, unspecified: Secondary | ICD-10-CM | POA: Diagnosis not present

## 2022-12-21 DIAGNOSIS — E039 Hypothyroidism, unspecified: Secondary | ICD-10-CM | POA: Diagnosis not present

## 2022-12-21 DIAGNOSIS — I480 Paroxysmal atrial fibrillation: Secondary | ICD-10-CM | POA: Diagnosis not present

## 2022-12-21 DIAGNOSIS — R918 Other nonspecific abnormal finding of lung field: Secondary | ICD-10-CM | POA: Diagnosis not present

## 2022-12-21 DIAGNOSIS — K219 Gastro-esophageal reflux disease without esophagitis: Secondary | ICD-10-CM | POA: Diagnosis not present

## 2022-12-21 DIAGNOSIS — M1990 Unspecified osteoarthritis, unspecified site: Secondary | ICD-10-CM | POA: Diagnosis not present

## 2022-12-21 DIAGNOSIS — I110 Hypertensive heart disease with heart failure: Secondary | ICD-10-CM | POA: Diagnosis not present

## 2022-12-21 DIAGNOSIS — G8929 Other chronic pain: Secondary | ICD-10-CM | POA: Diagnosis not present

## 2022-12-21 DIAGNOSIS — F419 Anxiety disorder, unspecified: Secondary | ICD-10-CM | POA: Diagnosis not present

## 2022-12-24 DIAGNOSIS — Z87891 Personal history of nicotine dependence: Secondary | ICD-10-CM | POA: Diagnosis not present

## 2022-12-24 DIAGNOSIS — R918 Other nonspecific abnormal finding of lung field: Secondary | ICD-10-CM | POA: Diagnosis not present

## 2022-12-24 DIAGNOSIS — Z01818 Encounter for other preprocedural examination: Secondary | ICD-10-CM | POA: Diagnosis not present

## 2022-12-24 DIAGNOSIS — R911 Solitary pulmonary nodule: Secondary | ICD-10-CM | POA: Diagnosis not present

## 2022-12-24 DIAGNOSIS — Z9981 Dependence on supplemental oxygen: Secondary | ICD-10-CM | POA: Diagnosis not present

## 2022-12-24 DIAGNOSIS — J984 Other disorders of lung: Secondary | ICD-10-CM | POA: Diagnosis not present

## 2022-12-25 DIAGNOSIS — R911 Solitary pulmonary nodule: Secondary | ICD-10-CM | POA: Insufficient documentation

## 2022-12-28 ENCOUNTER — Ambulatory Visit: Payer: Medicare Other | Admitting: Nurse Practitioner

## 2022-12-28 ENCOUNTER — Ambulatory Visit (INDEPENDENT_AMBULATORY_CARE_PROVIDER_SITE_OTHER): Payer: Medicare Other | Admitting: Nurse Practitioner

## 2022-12-28 ENCOUNTER — Encounter: Payer: Self-pay | Admitting: Nurse Practitioner

## 2022-12-28 VITALS — BP 165/78 | HR 71 | Temp 97.8°F | Resp 16 | Ht <= 58 in | Wt 184.4 lb

## 2022-12-28 DIAGNOSIS — R531 Weakness: Secondary | ICD-10-CM

## 2022-12-28 DIAGNOSIS — G8929 Other chronic pain: Secondary | ICD-10-CM

## 2022-12-28 DIAGNOSIS — R2681 Unsteadiness on feet: Secondary | ICD-10-CM | POA: Diagnosis not present

## 2022-12-28 DIAGNOSIS — M940 Chondrocostal junction syndrome [Tietze]: Secondary | ICD-10-CM

## 2022-12-28 DIAGNOSIS — M25511 Pain in right shoulder: Secondary | ICD-10-CM

## 2022-12-28 DIAGNOSIS — I10 Essential (primary) hypertension: Secondary | ICD-10-CM | POA: Diagnosis not present

## 2022-12-28 MED ORDER — OXYCODONE-ACETAMINOPHEN 7.5-325 MG PO TABS
ORAL_TABLET | ORAL | 0 refills | Status: DC
Start: 2022-12-28 — End: 2023-01-28

## 2022-12-28 MED ORDER — PREDNISONE 10 MG (21) PO TBPK
ORAL_TABLET | ORAL | 0 refills | Status: DC
Start: 2022-12-28 — End: 2023-02-06

## 2022-12-28 NOTE — Progress Notes (Signed)
[facility] [address] [place], [place] [address]  Internal MEDICINE  Office Visit Note  Patient Name: [name]  [id number]  [id number]  Date of Service: 12/28/2022  Chief Complaint  Patient presents with   Acute Visit    Pain under breast     HPI [name] presents for an acute sick visit for pain in right upper abdomen Pain started on 12/18/22 Dull constant pain  Sharp pain when coughing Pain meds help  Physical therapy did not help with it.  Going to see if cardiology wants to adjust her BP medication -- currently on carvedilol 6.25 mg twice daily and lisinopril 40 mg daily.      Current Medication:  Outpatient Encounter Medications as of 12/28/2022  Medication Sig Note   ALPRAZolam (XANAX) 0.25 MG tablet Take 1 tablet (0.25 mg total) by mouth 2 (two) times daily as needed for anxiety.    atorvastatin (LIPITOR) 40 MG tablet TAKE 1 TABLET BY MOUTH EVERY NIGHT AT BEDTIME    benzonatate (TESSALON) 200 MG capsule Take 1 capsule (200 mg total) by mouth 2 (two) times daily as needed for cough.    Budeson-Glycopyrrol-Formoterol (BREZTRI AEROSPHERE) 160-9-4.8 MCG/ACT AERO Inhale 2 puffs into the lungs 2 (two) times daily.    bumetanide (BUMEX) 1 MG tablet Take 2 tablets (2 mg total) by mouth 2 (two) times daily. And additional 2 tablets PM PRN    Calcium Carbonate-Vitamin D (CALCIUM 600+D PO) Take 1 tablet by mouth daily.    carvedilol (COREG) 6.25 MG tablet Take 1 tablet (6.25 mg total) by mouth 2 (two) times daily with a meal.    Coenzyme Q10 (COQ10) 200 MG CAPS Take 200 mg by mouth daily.    diclofenac Sodium (VOLTAREN) 1 % GEL SMARTSIG:Gram(s) Topical Twice Daily    ELIQUIS 5 MG TABS tablet TAKE 1 TABLET BY MOUTH TWICE DAILY 12/08/2022: Stopped taking it on 12/05/22 for procedure on 12/08/22.    ferrous sulfate 325 (65 FE) MG tablet Take 1 tablet (325 mg total) by mouth daily with breakfast.    ipratropium-albuterol (DUONEB) 0.5-2.5 (3) MG/3ML SOLN Inhale 3 mLs into  the lungs every 4 (four) hours as needed. Inhale 3 mls into the lungs every 4 to 6 hours and as needed    JARDIANCE 10 MG TABS tablet TAKE 1 TABLET(10 MG) BY MOUTH DAILY BEFORE BREAKFAST    levothyroxine (SYNTHROID) 25 MCG tablet TAKE 1 TABLET BY MOUTH DAILY BEFORE BREAKFAST    lisinopril (ZESTRIL) 40 MG tablet TAKE 1 TABLET(40 MG) BY MOUTH DAILY    loratadine (CLARITIN) 10 MG tablet Take 10 mg by mouth daily.    ondansetron (ZOFRAN-ODT) 4 MG disintegrating tablet Take 1 tablet (4 mg total) by mouth every 8 (eight) hours as needed for nausea or vomiting.    pantoprazole (PROTONIX) 40 MG tablet TAKE 1 TABLET BY MOUTH EVERY DAY    potassium chloride (KLOR-CON) 10 MEQ tablet TAKE 4 TABLETS BY MOUTH TWICE DAILY    predniSONE (STERAPRED UNI-PAK 21 TAB) 10 MG (21) TBPK tablet Use as directed for 6 days    revefenacin (YUPELRI) 175 MCG/3ML nebulizer solution Take 3 mLs (175 mcg total) by nebulization daily.    sertraline (ZOLOFT) 100 MG tablet TAKE 1 TABLET BY MOUTH ONCE A DAY FOR ANXIETY    sodium chloride (OCEAN) 0.65 % SOLN nasal spray Place 1 spray into both nostrils as needed for congestion.    traZODone (DESYREL) 50 MG tablet TAKE 1 TABLET BY MOUTH EVERY  DAY AT BEDTIME    VENTOLIN HFA 108 (90 Base) MCG/ACT inhaler INHALE 2 PUFFS INTO THE LUNGS EVERY 6 HOURS AS NEEDED FOR WHEEZING OR SHORTNESS OF BREATH    [DISCONTINUED] amoxicillin-clavulanate (AUGMENTIN) 875-125 MG tablet Take 1 tablet by mouth 2 (two) times daily.    [DISCONTINUED] oxyCODONE-acetaminophen (PERCOCET) 7.5-325 MG tablet Take one tab po bid for pain severe    [DISCONTINUED] predniSONE (DELTASONE) 10 MG tablet Take one tab 3 x day for 3 days, then take one tab 2 x a day for 3 days and then take one tab a day for 3 days for copd    oxyCODONE-acetaminophen (PERCOCET) 7.5-325 MG tablet Take one tab po bid for pain severe    No facility-administered encounter medications on file as of 12/28/2022.      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 s/p bioprosthetic valve replacement 2008 at [facility];  b. 01/2015 Echo: EF 60-65%, no rwma, Gr1 DD, mildly dil LA, nl RV fxn.   Aspiration pneumonia    Atrial fibrillation with RVR 01/11/2015   Atrial flutter    a. 08/2016 s/p DCCV.  Remains on flecainide 50 mg bid.   Basal skull fracture 20 yrs ago   CHF (congestive heart failure)    Chronic respiratory failure    COPD (chronic obstructive pulmonary disease)    a. 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 2008 prior to Aortic aneurysm repair-->nl cors.   History of stress test    a. 10/2015 MV: no ischemia/infarct.   HLD (hyperlipidemia)    HTN (hypertension)    Hypothyroidism    Obesity    PAF (paroxysmal atrial fibrillation)    a. on Eliquis; b. CHADS2VASc = 3 (HTN, age x 1, female).   Paroxysmal atrial fibrillation 01/24/2015   Right upper quadrant abdominal tenderness without rebound tenderness 03/02/2018   S/P ascending aortic aneurysm repair 2008     Vital Signs: BP (!) 165/78 Comment: 177/81  Pulse 71   Temp 97.8 F (36.6 C)   Resp 16   Ht  (1.473 m)   Wt 184 lb 6.4 oz (83.6 kg)   SpO2 93% Comment: 3L  BMI 38.54 kg/m    Review of Systems  Constitutional:  Positive for fatigue.  HENT: Negative.    Respiratory:  Negative for cough, chest tightness, shortness of breath and wheezing.        Chest wall pain  Cardiovascular: Negative.  Negative for chest pain and palpitations.  Gastrointestinal: Negative.     Physical Exam Vitals reviewed.  Constitutional:      General: She is not in acute distress.    Appearance: Normal appearance. She is obese. She is not ill-appearing.  HENT:     Head: Normocephalic and atraumatic.  Eyes:     Pupils: Pupils are equal, round, and reactive to light.  Cardiovascular:     Rate and  Rhythm: Normal rate and regular rhythm.  Pulmonary:     Effort: Pulmonary effort is normal. No respiratory distress.  Neurological:     Mental Status: She is alert and oriented to person, place, and time.  Psychiatric:        Mood and Affect: Mood normal.  Behavior: Behavior normal.       Assessment/Plan: 1. Acute costochondritis Prednisone pack prescribed.  Encouraged topical used of voltaren gel as well as warm compress and ice.  - predniSONE (STERAPRED UNI-PAK 21 TAB) 10 MG (21) TBPK tablet; Use as directed for 6 days  Dispense: 21 tablet; Refill: 0  2. Essential hypertension BP elevated, declined medication adjustment and will discuss with her cardiologist on Thursday.   3. Generalized weakness Walker with seat ordered - For home use only DME 4 wheeled rolling walker with seat ([id number])  4. Gait instability Walker with seat ordered - For home use only DME 4 wheeled rolling walker with seat ([id number])  5. Chronic right shoulder pain Refill oxycodone, continue as prescribed.  - oxyCODONE-acetaminophen (PERCOCET) 7.5-325 MG tablet; Take one tab po bid for pain severe  Dispense: 60 tablet; Refill: 0   General Counseling: Nakeitha verbalizes understanding of the findings of todays visit and agrees with plan of treatment. I have discussed any further diagnostic evaluation that may be needed or ordered today. We also reviewed her medications today. she has been encouraged to call the office with any questions or concerns that should arise related to todays visit.    Counseling:    Orders Placed This Encounter  Procedures   For home use only DME 4 wheeled rolling walker with seat ([id number])    Meds ordered this encounter  Medications   predniSONE (STERAPRED UNI-PAK 21 TAB) 10 MG (21) TBPK tablet    Sig: Use as directed for 6 days    Dispense:  21 tablet    Refill:  0   oxyCODONE-acetaminophen (PERCOCET) 7.5-325 MG tablet    Sig: Take one tab po bid for pain  severe    Dispense:  60 tablet    Refill:  0    For future refill    Return if symptoms worsen or fail to improve, for previously scheduled, Ashton Sabine PCP in may.  [place] Controlled Substance Database was reviewed by me for overdose risk score (ORS)  Time spent:30 Minutes Time spent with patient included reviewing progress notes, labs, imaging studies, and discussing plan for follow up.   This patient was seen by Sallyanne Kuster, FNP-C in collaboration with Dr. Beverely Risen as a part of collaborative care agreement.  Tyreece Gelles R. Tedd Sias, MSN, FNP-C Internal Medicine

## 2022-12-29 ENCOUNTER — Ambulatory Visit: Payer: Medicare Other | Admitting: Pain Medicine

## 2022-12-30 ENCOUNTER — Telehealth: Payer: Self-pay | Admitting: Radiation Oncology

## 2022-12-30 NOTE — Telephone Encounter (Signed)
This patient called and states she has had her heart fixed and is able to start treatment now. She would like a call back.

## 2022-12-31 ENCOUNTER — Ambulatory Visit: Payer: Medicare Other | Admitting: Pain Medicine

## 2022-12-31 DIAGNOSIS — I7121 Aneurysm of the ascending aorta, without rupture: Secondary | ICD-10-CM | POA: Diagnosis not present

## 2022-12-31 DIAGNOSIS — Q231 Congenital insufficiency of aortic valve: Secondary | ICD-10-CM | POA: Diagnosis not present

## 2022-12-31 DIAGNOSIS — I10 Essential (primary) hypertension: Secondary | ICD-10-CM | POA: Diagnosis not present

## 2022-12-31 DIAGNOSIS — I480 Paroxysmal atrial fibrillation: Secondary | ICD-10-CM | POA: Diagnosis not present

## 2023-01-01 DIAGNOSIS — J449 Chronic obstructive pulmonary disease, unspecified: Secondary | ICD-10-CM | POA: Diagnosis not present

## 2023-01-01 DIAGNOSIS — G4733 Obstructive sleep apnea (adult) (pediatric): Secondary | ICD-10-CM | POA: Diagnosis not present

## 2023-01-03 ENCOUNTER — Telehealth: Payer: Self-pay | Admitting: Internal Medicine

## 2023-01-03 NOTE — Telephone Encounter (Signed)
Patient lvm on after-hour line Saturday evening stating she is unable to reach her on-call cardiologist. I returned call back to patient, she stated her blood pressure has been running high and needs to speak with on-call cardiologist. I gave her telephone number [phone] for hospital operator and told her to have them page the on-call cardiologist for her regarding her BP. Advised her to call me back if she was unable to speak with them, and I would reach or on-call provider. Patient did not call back-Toni

## 2023-01-04 DIAGNOSIS — I10 Essential (primary) hypertension: Secondary | ICD-10-CM | POA: Diagnosis not present

## 2023-01-04 DIAGNOSIS — I7121 Aneurysm of the ascending aorta, without rupture: Secondary | ICD-10-CM | POA: Diagnosis not present

## 2023-01-04 DIAGNOSIS — I480 Paroxysmal atrial fibrillation: Secondary | ICD-10-CM | POA: Diagnosis not present

## 2023-01-04 DIAGNOSIS — Q231 Congenital insufficiency of aortic valve: Secondary | ICD-10-CM | POA: Diagnosis not present

## 2023-01-05 ENCOUNTER — Ambulatory Visit
Admission: RE | Admit: 2023-01-05 | Discharge: 2023-01-05 | Disposition: A | Discharge status: Home / Self Care | Payer: Medicare Other | Source: Ambulatory Visit | Attending: Radiation Oncology | Admitting: Radiation Oncology

## 2023-01-05 ENCOUNTER — Encounter: Payer: Self-pay | Admitting: Radiation Oncology

## 2023-01-05 VITALS — BP 108/63 | HR 70 | Temp 98.2°F | Resp 20 | Ht <= 58 in | Wt 187.0 lb

## 2023-01-05 DIAGNOSIS — C3411 Malignant neoplasm of upper lobe, right bronchus or lung: Secondary | ICD-10-CM | POA: Diagnosis not present

## 2023-01-05 DIAGNOSIS — Z87891 Personal history of nicotine dependence: Secondary | ICD-10-CM | POA: Diagnosis not present

## 2023-01-05 DIAGNOSIS — R918 Other nonspecific abnormal finding of lung field: Secondary | ICD-10-CM | POA: Insufficient documentation

## 2023-01-05 NOTE — Progress Notes (Signed)
Radiation Oncology Follow up Note  Name: [name]   Date:   01/05/2023 MRN:  [id number] DOB: 03-03-1946    This 77 y.o. 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  I originally consulted back in December 23.  She had a presumed stage I non-small cell lung cancer right upper lobe for which we recommended SBRT treatment.  She is gone on at [facility] to [facility] with 23 mm Medtronic CoreValve Evolute fracture 11/03/2022 at [facility]  She is seen today for reconsideration of treatment.  She specifically denies cough hemoptysis or chest tightness.  Recent CT scan at [facility] shows a 9 x 12 mm irregular nodule in the right apex increased when compared to prior exams.  COMPLICATIONS OF TREATMENT: none  FOLLOW UP COMPLIANCE: keeps appointments   PHYSICAL EXAM:  BP 108/63 (BP Location: Right Leg, Patient Position: Sitting, Cuff Size: Normal)   Pulse 70   Temp 98.2 F (36.8 C) (Tympanic)   Resp 20   Ht 4\' 10"  (1.473 m) Comment: stated HT  Wt 187 lb (84.8 kg)   BMI 39.08 kg/m  Frail-appearing female in NAD on nasal oxygen.  Well-developed well-nourished patient in NAD. HEENT reveals PERLA, EOMI, discs not visualized.  Oral cavity is clear. No oral mucosal lesions are identified. Neck is clear without evidence of cervical or supraclavicular adenopathy. Lungs are clear to A&P. Cardiac examination is essentially unremarkable with regular rate and rhythm without murmur rub or thrill. Abdomen is benign with no organomegaly or masses noted. Motor sensory and DTR levels are equal and symmetric in the upper and lower extremities. Cranial nerves II through XII are grossly intact. Proprioception is intact. No peripheral adenopathy or  edema is identified. No motor or sensory levels are noted. Crude visual fields are within normal range.  RADIOLOGY RESULTS: Previous CT scans reviewed compatible with above-stated findings  PLAN: At this time like to go ahead with SBRT to her right upper lobe lesion.  At this time I think to deliver 60 Gray in 5 fractions.  Risks and benefits of treatment including extremely low side effect profile were reviewed with the patient.  I have personally 7 ordered CT simulation for next week.  Patient comprehends my recommendations well.  I would like to take this opportunity to thank you for allowing me to participate in the care of your patient.[name], MD

## 2023-01-07 ENCOUNTER — Ambulatory Visit (HOSPITAL_BASED_OUTPATIENT_CLINIC_OR_DEPARTMENT_OTHER): Payer: Medicare Other | Admitting: Pain Medicine

## 2023-01-07 DIAGNOSIS — Z91199 Patient's noncompliance with other medical treatment and regimen due to unspecified reason: Secondary | ICD-10-CM

## 2023-01-07 NOTE — Progress Notes (Signed)
(  01/07/2023) NO-SHOW to procedure (trigger point) appointment.  The patient called on the day of the appointment to cancel indicating that she was not feeling well.

## 2023-01-11 ENCOUNTER — Ambulatory Visit
Admission: RE | Admit: 2023-01-11 | Discharge: 2023-01-11 | Disposition: A | Discharge status: Home / Self Care | Payer: Medicare Other | Source: Ambulatory Visit | Attending: Radiation Oncology | Admitting: Radiation Oncology

## 2023-01-11 ENCOUNTER — Ambulatory Visit: Payer: Medicare Other | Admitting: Nurse Practitioner

## 2023-01-11 DIAGNOSIS — C3411 Malignant neoplasm of upper lobe, right bronchus or lung: Secondary | ICD-10-CM | POA: Diagnosis not present

## 2023-01-11 DIAGNOSIS — R918 Other nonspecific abnormal finding of lung field: Secondary | ICD-10-CM | POA: Insufficient documentation

## 2023-01-11 DIAGNOSIS — Z87891 Personal history of nicotine dependence: Secondary | ICD-10-CM | POA: Diagnosis not present

## 2023-01-11 DIAGNOSIS — R911 Solitary pulmonary nodule: Secondary | ICD-10-CM | POA: Diagnosis not present

## 2023-01-11 DIAGNOSIS — Z510 Encounter for antineoplastic radiation therapy: Secondary | ICD-10-CM | POA: Diagnosis not present

## 2023-01-12 ENCOUNTER — Telehealth: Payer: Self-pay

## 2023-01-12 NOTE — Telephone Encounter (Signed)
done

## 2023-01-12 NOTE — Telephone Encounter (Signed)
Faxed [employer] for walker with seat  

## 2023-01-15 ENCOUNTER — Telehealth: Payer: Self-pay | Admitting: Nurse Practitioner

## 2023-01-15 ENCOUNTER — Ambulatory Visit
Admission: RE | Admit: 2023-01-15 | Discharge: 2023-01-15 | Disposition: A | Discharge status: Home / Self Care | Payer: Medicare Other | Attending: Nurse Practitioner | Admitting: Nurse Practitioner

## 2023-01-15 ENCOUNTER — Ambulatory Visit
Admission: RE | Admit: 2023-01-15 | Discharge: 2023-01-15 | Disposition: A | Discharge status: Home / Self Care | Payer: Medicare Other | Source: Ambulatory Visit | Attending: Nurse Practitioner | Admitting: Nurse Practitioner

## 2023-01-15 ENCOUNTER — Encounter: Payer: Self-pay | Admitting: Nurse Practitioner

## 2023-01-15 ENCOUNTER — Ambulatory Visit (INDEPENDENT_AMBULATORY_CARE_PROVIDER_SITE_OTHER): Payer: Medicare Other | Admitting: Nurse Practitioner

## 2023-01-15 VITALS — BP 106/49 | HR 71 | Temp 96.6°F | Resp 16 | Ht <= 58 in | Wt 189.8 lb

## 2023-01-15 DIAGNOSIS — Z9181 History of falling: Secondary | ICD-10-CM | POA: Diagnosis not present

## 2023-01-15 DIAGNOSIS — R52 Pain, unspecified: Secondary | ICD-10-CM

## 2023-01-15 DIAGNOSIS — I10 Essential (primary) hypertension: Secondary | ICD-10-CM

## 2023-01-15 DIAGNOSIS — R0789 Other chest pain: Secondary | ICD-10-CM | POA: Insufficient documentation

## 2023-01-15 DIAGNOSIS — R059 Cough, unspecified: Secondary | ICD-10-CM | POA: Insufficient documentation

## 2023-01-15 DIAGNOSIS — R531 Weakness: Secondary | ICD-10-CM | POA: Diagnosis not present

## 2023-01-15 DIAGNOSIS — R079 Chest pain, unspecified: Secondary | ICD-10-CM | POA: Diagnosis not present

## 2023-01-15 MED ORDER — METHOCARBAMOL 500 MG PO TABS
500.0000 mg | ORAL_TABLET | Freq: Four times a day (QID) | ORAL | 2 refills | Status: DC | PRN
Start: 2023-01-15 — End: 2023-03-08

## 2023-01-15 NOTE — Progress Notes (Signed)
[facility] [address] [place], [place] [address]  Internal MEDICINE  Office Visit Note  Patient Name: [name]  [id number]  [id number]  Date of Service: 01/15/2023  Chief Complaint  Patient presents with   Acute Visit    Pain under breast isn't better.      HPI Onell presents for an acute sick visit for chest wall pain is not better from previous visit on 12/28/22 Hypertension -- was seen by cardiology and they adjusted her medications . Starting cancer treatments next Thursday.  Right side chest pain still present, prednisone did not help. Fell on right side 2 months ago but did not hurt at the time so she did not get it looked at then. Also patient has had bouts of deep painful coughing off and on over past few months. Concern for possible rib fracture or pulled muscle.     Current Medication:  Outpatient Encounter Medications as of 01/15/2023  Medication Sig Note   ALPRAZolam (XANAX) 0.25 MG tablet Take 1 tablet (0.25 mg total) by mouth 2 (two) times daily as needed for anxiety.    amLODipine (NORVASC) 5 MG tablet Take 1 tablet by mouth in the morning and at bedtime.    atorvastatin (LIPITOR) 40 MG tablet TAKE 1 TABLET BY MOUTH EVERY NIGHT AT BEDTIME    benzonatate (TESSALON) 200 MG capsule Take 1 capsule (200 mg total) by mouth 2 (two) times daily as needed for cough.    Budeson-Glycopyrrol-Formoterol (BREZTRI AEROSPHERE) 160-9-4.8 MCG/ACT AERO Inhale 2 puffs into the lungs 2 (two) times daily.    bumetanide (BUMEX) 1 MG tablet Take 2 tablets (2 mg total) by mouth 2 (two) times daily. And additional 2 tablets PM PRN    Calcium Carbonate-Vitamin D (CALCIUM 600+D PO) Take 1 tablet by mouth daily.    carvedilol (COREG) 6.25 MG tablet Take 1 tablet (6.25 mg total) by mouth 2 (two) times daily with a meal.    Coenzyme Q10 (COQ10) 200 MG CAPS Take 200 mg by mouth daily.    diclofenac Sodium (VOLTAREN) 1 % GEL SMARTSIG:Gram(s) Topical Twice Daily    ELIQUIS 5 MG  TABS tablet TAKE 1 TABLET BY MOUTH TWICE DAILY 12/08/2022: Stopped taking it on 12/05/22 for procedure on 12/08/22.    ferrous sulfate 325 (65 FE) MG tablet Take 1 tablet (325 mg total) by mouth daily with breakfast.    ipratropium-albuterol (DUONEB) 0.5-2.5 (3) MG/3ML SOLN Inhale 3 mLs into the lungs every 4 (four) hours as needed. Inhale 3 mls into the lungs every 4 to 6 hours and as needed    JARDIANCE 10 MG TABS tablet TAKE 1 TABLET(10 MG) BY MOUTH DAILY BEFORE BREAKFAST    levothyroxine (SYNTHROID) 25 MCG tablet TAKE 1 TABLET BY MOUTH DAILY BEFORE BREAKFAST    lisinopril (ZESTRIL) 40 MG tablet TAKE 1 TABLET(40 MG) BY MOUTH DAILY    loratadine (CLARITIN) 10 MG tablet Take 10 mg by mouth daily.    methocarbamol (ROBAXIN) 500 MG tablet Take 1 tablet (500 mg total) by mouth every 6 (six) hours as needed for muscle spasms.    ondansetron (ZOFRAN-ODT) 4 MG disintegrating tablet Take 1 tablet (4 mg total) by mouth every 8 (eight) hours as needed for nausea or vomiting.    oxyCODONE-acetaminophen (PERCOCET) 7.5-325 MG tablet Take one tab po bid for pain severe    pantoprazole (PROTONIX) 40 MG tablet TAKE 1 TABLET BY MOUTH EVERY DAY    potassium chloride (KLOR-CON) 10 MEQ tablet TAKE 4  TABLETS BY MOUTH TWICE DAILY    predniSONE (STERAPRED UNI-PAK 21 TAB) 10 MG (21) TBPK tablet Use as directed for 6 days    revefenacin (YUPELRI) 175 MCG/3ML nebulizer solution Take 3 mLs (175 mcg total) by nebulization daily.    sertraline (ZOLOFT) 100 MG tablet TAKE 1 TABLET BY MOUTH ONCE A DAY FOR ANXIETY    sodium chloride (OCEAN) 0.65 % SOLN nasal spray Place 1 spray into both nostrils as needed for congestion.    traZODone (DESYREL) 50 MG tablet TAKE 1 TABLET BY MOUTH EVERY DAY AT BEDTIME    VENTOLIN HFA 108 (90 Base) MCG/ACT inhaler INHALE 2 PUFFS INTO THE LUNGS EVERY 6 HOURS AS NEEDED FOR WHEEZING OR SHORTNESS OF BREATH    No facility-administered encounter medications on file as of 01/15/2023.      Medical  History: Past Medical History:  Diagnosis Date   Aortic 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 s/p bioprosthetic valve replacement 2008 at [facility];  b. 01/2015 Echo: EF 60-65%, no rwma, Gr1 DD, mildly dil LA, nl RV fxn.   Aspiration pneumonia ([name])    Atrial fibrillation with RVR ([name]) 01/11/2015   Atrial flutter ([name])    a. 08/2016 s/p DCCV.  Remains on flecainide 50 mg bid.   Basal skull fracture ([name]) 20 yrs ago   CHF (congestive heart failure) ([name])    Chronic respiratory failure ([name])    COPD (chronic obstructive pulmonary disease) ([name])    a. 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 2008 prior to Aortic aneurysm repair-->nl cors.   History of stress test    a. 10/2015 MV: no ischemia/infarct.   HLD (hyperlipidemia)    HTN (hypertension)    Hypothyroidism    Obesity    PAF (paroxysmal atrial fibrillation) ([name])    a. on Eliquis; b. CHADS2VASc = 3 (HTN, age x 1, female).   Paroxysmal atrial fibrillation ([name]) 01/24/2015   Right upper quadrant abdominal tenderness without rebound tenderness 03/02/2018   S/P ascending aortic aneurysm repair 2008     Vital Signs: BP (!) 106/49   Pulse 71   Temp (!) 96.6 F (35.9 C)   Resp 16   Ht 4\' 10"  (1.473 m)   Wt 189 lb 12.8 oz (86.1 kg)   SpO2 94% Comment: 5L  BMI 39.67 kg/m    Review of Systems  Constitutional:  Positive for fatigue.  HENT: Negative.    Respiratory:  Negative for cough, chest tightness, shortness of breath and wheezing.        Chest wall pain  Cardiovascular: Negative.  Negative for chest pain and palpitations.  Gastrointestinal: Negative.     Physical Exam Vitals reviewed.  Constitutional:      General: She is not in acute distress.    Appearance: Normal appearance. She is obese. She is not ill-appearing.  HENT:     Head: Normocephalic and atraumatic.  Eyes:     Pupils: Pupils are  equal, round, and reactive to light.  Cardiovascular:     Rate and Rhythm: Normal rate and regular rhythm.  Pulmonary:     Effort: Pulmonary effort is normal. No respiratory distress.     Comments: Right side tenderness over ribs.  Neurological:     Mental Status: She is alert and oriented to person, place,  and time.  Psychiatric:        Mood and Affect: Mood normal.        Behavior: Behavior normal.       Assessment/Plan: 1. Right-sided chest wall pain Right side rib xray ordered to rule out fracture. Muscle relaxant prescribed for musculoskeletal pain.  - DG Ribs Unilateral Right; Future - methocarbamol (ROBAXIN) 500 MG tablet; Take 1 tablet (500 mg total) by mouth every 6 (six) hours as needed for muscle spasms.  Dispense: 90 tablet; Refill: 2  2. History of recent fall Xray ordered to rule out rib fracture  - DG Ribs Unilateral Right; Future  3. Pain aggravated by coughing and deep breathing Xray ordered to rule out rib fracture - DG Ribs Unilateral Right; Future - methocarbamol (ROBAXIN) 500 MG tablet; Take 1 tablet (500 mg total) by mouth every 6 (six) hours as needed for muscle spasms.  Dispense: 90 tablet; Refill: 2  4. Essential hypertension Cardiology increased her carvedilol dose and added amlodipine. Blood pressure is much better today.   5. Generalized weakness Waiting for her rolling walker from [employer] which should be ready soon   General Counseling: Jovonna verbalizes understanding of the findings of todays visit and agrees with plan of treatment. I have discussed any further diagnostic evaluation that may be needed or ordered today. We also reviewed her medications today. she has been encouraged to call the office with any questions or concerns that should arise related to todays visit.    Counseling:    Orders Placed This Encounter  Procedures   DG Ribs Unilateral Right    Meds ordered this encounter  Medications   methocarbamol (ROBAXIN)  500 MG tablet    Sig: Take 1 tablet (500 mg total) by mouth every 6 (six) hours as needed for muscle spasms.    Dispense:  90 tablet    Refill:  2    Fill today    Return in about 2 months (around 03/17/2023) for F/U, Bob Daversa PCP.   Controlled Substance Database was reviewed by me for overdose risk score (ORS)  Time spent:30 Minutes Time spent with patient included reviewing progress notes, labs, imaging studies, and discussing plan for follow up.   This patient was seen by Sallyanne Kuster, FNP-C in collaboration with Dr. Beverely Risen as a part of collaborative care agreement.  Delany Steury R. Tedd Sias, MSN, FNP-C Internal Medicine

## 2023-01-15 NOTE — Telephone Encounter (Signed)
Walker & seat order faxed back to [employer]; [phone]. Scanned-Toni

## 2023-01-18 ENCOUNTER — Telehealth: Payer: Self-pay

## 2023-01-18 DIAGNOSIS — M940 Chondrocostal junction syndrome [Tietze]: Secondary | ICD-10-CM | POA: Diagnosis not present

## 2023-01-18 DIAGNOSIS — R2681 Unsteadiness on feet: Secondary | ICD-10-CM | POA: Diagnosis not present

## 2023-01-18 DIAGNOSIS — F411 Generalized anxiety disorder: Secondary | ICD-10-CM

## 2023-01-18 DIAGNOSIS — R911 Solitary pulmonary nodule: Secondary | ICD-10-CM | POA: Diagnosis not present

## 2023-01-18 DIAGNOSIS — Z510 Encounter for antineoplastic radiation therapy: Secondary | ICD-10-CM | POA: Diagnosis not present

## 2023-01-18 DIAGNOSIS — Z87891 Personal history of nicotine dependence: Secondary | ICD-10-CM | POA: Diagnosis not present

## 2023-01-18 DIAGNOSIS — C3411 Malignant neoplasm of upper lobe, right bronchus or lung: Secondary | ICD-10-CM | POA: Diagnosis not present

## 2023-01-18 DIAGNOSIS — R531 Weakness: Secondary | ICD-10-CM | POA: Diagnosis not present

## 2023-01-20 ENCOUNTER — Ambulatory Visit: Payer: Medicare Other | Admitting: Nurse Practitioner

## 2023-01-20 MED ORDER — ALPRAZOLAM 0.25 MG PO TABS
0.2500 mg | ORAL_TABLET | Freq: Two times a day (BID) | ORAL | 1 refills | Status: DC | PRN
Start: 2023-01-20 — End: 2023-03-29

## 2023-01-20 NOTE — Telephone Encounter (Signed)
Left message for patient that her medicine was sent.

## 2023-01-21 ENCOUNTER — Other Ambulatory Visit: Payer: Self-pay

## 2023-01-21 ENCOUNTER — Ambulatory Visit
Admission: RE | Admit: 2023-01-21 | Discharge: 2023-01-21 | Disposition: A | Discharge status: Home / Self Care | Payer: Medicare Other | Source: Ambulatory Visit | Attending: Radiation Oncology | Admitting: Radiation Oncology

## 2023-01-21 DIAGNOSIS — Z510 Encounter for antineoplastic radiation therapy: Secondary | ICD-10-CM | POA: Diagnosis not present

## 2023-01-21 DIAGNOSIS — R911 Solitary pulmonary nodule: Secondary | ICD-10-CM | POA: Diagnosis not present

## 2023-01-21 DIAGNOSIS — C3411 Malignant neoplasm of upper lobe, right bronchus or lung: Secondary | ICD-10-CM | POA: Diagnosis not present

## 2023-01-21 LAB — RAD ONC ARIA SESSION SUMMARY
Course Elapsed Days: 0
Plan Fractions Treated to Date: 1
Plan Prescribed Dose Per Fraction: 12 Gy
Plan Total Fractions Prescribed: 5
Plan Total Prescribed Dose: 60 Gy
Reference Point Dosage Given to Date: 12 Gy
Reference Point Session Dosage Given: 12 Gy
Session Number: 1

## 2023-01-21 NOTE — Progress Notes (Signed)
No acute or healed rib fractures see on xray

## 2023-01-22 ENCOUNTER — Telehealth: Payer: Self-pay

## 2023-01-22 NOTE — Telephone Encounter (Signed)
-----   Message from Sallyanne Kuster, NP sent at 01/21/2023  9:10 AM EDT ----- No acute or healed rib fractures see on xray

## 2023-01-22 NOTE — Telephone Encounter (Signed)
Patient notified

## 2023-01-25 ENCOUNTER — Ambulatory Visit
Admission: RE | Admit: 2023-01-25 | Discharge: 2023-01-25 | Disposition: A | Discharge status: Home / Self Care | Payer: Medicare Other | Source: Ambulatory Visit | Attending: Radiation Oncology | Admitting: Radiation Oncology

## 2023-01-25 ENCOUNTER — Other Ambulatory Visit: Payer: Self-pay

## 2023-01-25 DIAGNOSIS — C3411 Malignant neoplasm of upper lobe, right bronchus or lung: Secondary | ICD-10-CM | POA: Diagnosis not present

## 2023-01-25 DIAGNOSIS — Z510 Encounter for antineoplastic radiation therapy: Secondary | ICD-10-CM | POA: Diagnosis not present

## 2023-01-25 DIAGNOSIS — R911 Solitary pulmonary nodule: Secondary | ICD-10-CM | POA: Diagnosis not present

## 2023-01-25 LAB — RAD ONC ARIA SESSION SUMMARY
Course Elapsed Days: 4
Plan Fractions Treated to Date: 2
Plan Prescribed Dose Per Fraction: 12 Gy
Plan Total Fractions Prescribed: 5
Plan Total Prescribed Dose: 60 Gy
Reference Point Dosage Given to Date: 24 Gy
Reference Point Session Dosage Given: 12 Gy
Session Number: 2

## 2023-01-27 ENCOUNTER — Ambulatory Visit
Admission: RE | Admit: 2023-01-27 | Discharge: 2023-01-27 | Disposition: A | Discharge status: Home / Self Care | Payer: Medicare Other | Source: Ambulatory Visit | Attending: Radiation Oncology | Admitting: Radiation Oncology

## 2023-01-27 ENCOUNTER — Other Ambulatory Visit: Payer: Self-pay

## 2023-01-27 DIAGNOSIS — C3411 Malignant neoplasm of upper lobe, right bronchus or lung: Secondary | ICD-10-CM | POA: Diagnosis not present

## 2023-01-27 DIAGNOSIS — R911 Solitary pulmonary nodule: Secondary | ICD-10-CM | POA: Diagnosis not present

## 2023-01-27 DIAGNOSIS — Z510 Encounter for antineoplastic radiation therapy: Secondary | ICD-10-CM | POA: Diagnosis not present

## 2023-01-27 LAB — RAD ONC ARIA SESSION SUMMARY
Course Elapsed Days: 6
Plan Fractions Treated to Date: 3
Plan Prescribed Dose Per Fraction: 12 Gy
Plan Total Fractions Prescribed: 5
Plan Total Prescribed Dose: 60 Gy
Reference Point Dosage Given to Date: 36 Gy
Reference Point Session Dosage Given: 12 Gy
Session Number: 3

## 2023-01-28 ENCOUNTER — Telehealth: Payer: Self-pay

## 2023-01-28 DIAGNOSIS — G8929 Other chronic pain: Secondary | ICD-10-CM

## 2023-01-28 MED ORDER — OXYCODONE-ACETAMINOPHEN 7.5-325 MG PO TABS
ORAL_TABLET | ORAL | 0 refills | Status: DC
Start: 2023-01-28 — End: 2023-03-05

## 2023-01-28 NOTE — Telephone Encounter (Signed)
Lmom that we send med °

## 2023-01-30 ENCOUNTER — Other Ambulatory Visit: Payer: Self-pay | Admitting: Nurse Practitioner

## 2023-01-30 DIAGNOSIS — J449 Chronic obstructive pulmonary disease, unspecified: Secondary | ICD-10-CM

## 2023-01-31 DIAGNOSIS — G4733 Obstructive sleep apnea (adult) (pediatric): Secondary | ICD-10-CM | POA: Diagnosis not present

## 2023-01-31 DIAGNOSIS — J449 Chronic obstructive pulmonary disease, unspecified: Secondary | ICD-10-CM | POA: Diagnosis not present

## 2023-02-01 ENCOUNTER — Emergency Department: Payer: Medicare Other

## 2023-02-01 ENCOUNTER — Inpatient Hospital Stay
Admission: EM | Admit: 2023-02-01 | Discharge: 2023-02-06 | DRG: 291 | Disposition: A | Discharge status: Home / Self Care | Payer: Medicare Other | Attending: Internal Medicine | Admitting: Internal Medicine

## 2023-02-01 ENCOUNTER — Telehealth: Payer: Self-pay | Admitting: Internal Medicine

## 2023-02-01 DIAGNOSIS — Z881 Allergy status to other antibiotic agents status: Secondary | ICD-10-CM

## 2023-02-01 DIAGNOSIS — R0981 Nasal congestion: Secondary | ICD-10-CM | POA: Diagnosis not present

## 2023-02-01 DIAGNOSIS — R0602 Shortness of breath: Secondary | ICD-10-CM | POA: Diagnosis present

## 2023-02-01 DIAGNOSIS — I38 Endocarditis, valve unspecified: Secondary | ICD-10-CM | POA: Diagnosis not present

## 2023-02-01 DIAGNOSIS — H9193 Unspecified hearing loss, bilateral: Secondary | ICD-10-CM | POA: Diagnosis present

## 2023-02-01 DIAGNOSIS — E669 Obesity, unspecified: Secondary | ICD-10-CM | POA: Diagnosis present

## 2023-02-01 DIAGNOSIS — B9781 Human metapneumovirus as the cause of diseases classified elsewhere: Secondary | ICD-10-CM | POA: Diagnosis present

## 2023-02-01 DIAGNOSIS — J449 Chronic obstructive pulmonary disease, unspecified: Secondary | ICD-10-CM | POA: Diagnosis not present

## 2023-02-01 DIAGNOSIS — Z7901 Long term (current) use of anticoagulants: Secondary | ICD-10-CM | POA: Diagnosis not present

## 2023-02-01 DIAGNOSIS — Z882 Allergy status to sulfonamides status: Secondary | ICD-10-CM

## 2023-02-01 DIAGNOSIS — E876 Hypokalemia: Secondary | ICD-10-CM | POA: Diagnosis not present

## 2023-02-01 DIAGNOSIS — E039 Hypothyroidism, unspecified: Secondary | ICD-10-CM | POA: Diagnosis not present

## 2023-02-01 DIAGNOSIS — I10 Essential (primary) hypertension: Secondary | ICD-10-CM | POA: Diagnosis present

## 2023-02-01 DIAGNOSIS — Z7984 Long term (current) use of oral hypoglycemic drugs: Secondary | ICD-10-CM | POA: Diagnosis not present

## 2023-02-01 DIAGNOSIS — Z90710 Acquired absence of both cervix and uterus: Secondary | ICD-10-CM

## 2023-02-01 DIAGNOSIS — Z1152 Encounter for screening for COVID-19: Secondary | ICD-10-CM

## 2023-02-01 DIAGNOSIS — Z888 Allergy status to other drugs, medicaments and biological substances status: Secondary | ICD-10-CM | POA: Diagnosis not present

## 2023-02-01 DIAGNOSIS — I4821 Permanent atrial fibrillation: Secondary | ICD-10-CM | POA: Diagnosis not present

## 2023-02-01 DIAGNOSIS — Z79890 Hormone replacement therapy (postmenopausal): Secondary | ICD-10-CM | POA: Diagnosis not present

## 2023-02-01 DIAGNOSIS — Z79899 Other long term (current) drug therapy: Secondary | ICD-10-CM

## 2023-02-01 DIAGNOSIS — I5033 Acute on chronic diastolic (congestive) heart failure: Secondary | ICD-10-CM | POA: Diagnosis not present

## 2023-02-01 DIAGNOSIS — C3411 Malignant neoplasm of upper lobe, right bronchus or lung: Secondary | ICD-10-CM | POA: Diagnosis present

## 2023-02-01 DIAGNOSIS — R0600 Dyspnea, unspecified: Secondary | ICD-10-CM | POA: Diagnosis not present

## 2023-02-01 DIAGNOSIS — R059 Cough, unspecified: Secondary | ICD-10-CM | POA: Diagnosis not present

## 2023-02-01 DIAGNOSIS — R062 Wheezing: Secondary | ICD-10-CM | POA: Diagnosis not present

## 2023-02-01 DIAGNOSIS — I482 Chronic atrial fibrillation, unspecified: Secondary | ICD-10-CM | POA: Diagnosis present

## 2023-02-01 DIAGNOSIS — Z950 Presence of cardiac pacemaker: Secondary | ICD-10-CM | POA: Diagnosis not present

## 2023-02-01 DIAGNOSIS — J9611 Chronic respiratory failure with hypoxia: Secondary | ICD-10-CM | POA: Diagnosis not present

## 2023-02-01 DIAGNOSIS — Z953 Presence of xenogenic heart valve: Secondary | ICD-10-CM

## 2023-02-01 DIAGNOSIS — F419 Anxiety disorder, unspecified: Secondary | ICD-10-CM | POA: Diagnosis present

## 2023-02-01 DIAGNOSIS — I110 Hypertensive heart disease with heart failure: Secondary | ICD-10-CM | POA: Diagnosis not present

## 2023-02-01 DIAGNOSIS — Z8679 Personal history of other diseases of the circulatory system: Secondary | ICD-10-CM | POA: Diagnosis not present

## 2023-02-01 DIAGNOSIS — E785 Hyperlipidemia, unspecified: Secondary | ICD-10-CM | POA: Diagnosis not present

## 2023-02-01 DIAGNOSIS — Z5320 Procedure and treatment not carried out because of patient's decision for unspecified reasons: Secondary | ICD-10-CM | POA: Diagnosis present

## 2023-02-01 LAB — RESP PANEL BY RT-PCR (FLU A&B, COVID) ARPGX2
Influenza A by PCR: NEGATIVE
Influenza B by PCR: NEGATIVE
SARS Coronavirus 2 by RT PCR: NEGATIVE

## 2023-02-01 LAB — CBC
HCT: 36.3 % (ref 36.0–46.0)
Hemoglobin: 11.2 g/dL — ABNORMAL LOW (ref 12.0–15.0)
MCH: 28 pg (ref 26.0–34.0)
MCHC: 30.9 g/dL (ref 30.0–36.0)
MCV: 90.8 fL (ref 80.0–100.0)
Platelets: 235 10*3/uL (ref 150–400)
RBC: 4 MIL/uL (ref 3.87–5.11)
RDW: 16.4 % — ABNORMAL HIGH (ref 11.5–15.5)
WBC: 5.4 10*3/uL (ref 4.0–10.5)
nRBC: 0 % (ref 0.0–0.2)

## 2023-02-01 LAB — BASIC METABOLIC PANEL
Anion gap: 11 (ref 5–15)
BUN: 18 mg/dL (ref 8–23)
CO2: 31 mmol/L (ref 22–32)
Calcium: 8.8 mg/dL — ABNORMAL LOW (ref 8.9–10.3)
Chloride: 98 mmol/L (ref 98–111)
Creatinine, Ser: 0.86 mg/dL (ref 0.44–1.00)
GFR, Estimated: >60 mL/min (ref >60)
Glucose, Bld: 112 mg/dL — ABNORMAL HIGH (ref 70–99)
Potassium: 3.3 mmol/L — ABNORMAL LOW (ref 3.5–5.1)
Sodium: 140 mmol/L (ref 135–145)

## 2023-02-01 LAB — BRAIN NATRIURETIC PEPTIDE: B Natriuretic Peptide: 330.6 pg/mL — ABNORMAL HIGH (ref 0.0–100.0)

## 2023-02-01 LAB — TROPONIN I (HIGH SENSITIVITY): Troponin I (High Sensitivity): 15 ng/L (ref <18)

## 2023-02-01 MED ORDER — SODIUM CHLORIDE 0.9% FLUSH
3.0000 mL | Freq: Two times a day (BID) | INTRAVENOUS | Status: DC
  Administered 2023-02-02 – 2023-02-06 (×9): 3 mL via INTRAVENOUS

## 2023-02-01 MED ORDER — ALBUTEROL SULFATE HFA 108 (90 BASE) MCG/ACT IN AERS: 2.0000 | INHALATION_SPRAY | Freq: Four times a day (QID) | RESPIRATORY_TRACT | Status: DC | PRN

## 2023-02-01 MED ORDER — SERTRALINE HCL 50 MG PO TABS
100.0000 mg | ORAL_TABLET | Freq: Every day | ORAL | Status: DC
  Administered 2023-02-02 – 2023-02-06 (×5): 100 mg via ORAL
  Filled 2023-02-01 (×5): qty 2

## 2023-02-01 MED ORDER — BUMETANIDE 0.25 MG/ML IJ SOLN
0.5000 mg | Freq: Two times a day (BID) | INTRAMUSCULAR | Status: DC
  Administered 2023-02-02 – 2023-02-04 (×5): 0.5 mg via INTRAVENOUS
  Filled 2023-02-01 (×6): qty 4

## 2023-02-01 MED ORDER — ATORVASTATIN CALCIUM 20 MG PO TABS
40.0000 mg | ORAL_TABLET | Freq: Every day | ORAL | Status: DC
  Administered 2023-02-02 – 2023-02-05 (×4): 40 mg via ORAL
  Filled 2023-02-01 (×4): qty 2

## 2023-02-01 MED ORDER — ALPRAZOLAM 0.25 MG PO TABS
0.2500 mg | ORAL_TABLET | Freq: Two times a day (BID) | ORAL | Status: DC | PRN
  Administered 2023-02-02 – 2023-02-04 (×4): 0.25 mg via ORAL
  Filled 2023-02-01 (×5): qty 1

## 2023-02-01 MED ORDER — APIXABAN 5 MG PO TABS
5.0000 mg | ORAL_TABLET | Freq: Two times a day (BID) | ORAL | Status: DC
  Administered 2023-02-02 – 2023-02-06 (×9): 5 mg via ORAL
  Filled 2023-02-01 (×9): qty 1

## 2023-02-01 MED ORDER — ACETAMINOPHEN 650 MG RE SUPP: 650.0000 mg | Freq: Four times a day (QID) | RECTAL | Status: DC | PRN

## 2023-02-01 MED ORDER — HYDRALAZINE HCL 20 MG/ML IJ SOLN
5.0000 mg | INTRAMUSCULAR | Status: DC | PRN
  Administered 2023-02-03: 5 mg via INTRAVENOUS
  Filled 2023-02-01: qty 1

## 2023-02-01 MED ORDER — METHYLPREDNISOLONE SODIUM SUCC 125 MG IJ SOLR
125.0000 mg | INTRAMUSCULAR | Status: AC
  Administered 2023-02-01: 125 mg via INTRAVENOUS
  Filled 2023-02-01: qty 2

## 2023-02-01 MED ORDER — ACETAMINOPHEN 325 MG PO TABS: 650.0000 mg | ORAL_TABLET | Freq: Four times a day (QID) | ORAL | Status: DC | PRN

## 2023-02-01 MED ORDER — LEVOTHYROXINE SODIUM 25 MCG PO TABS
25.0000 ug | ORAL_TABLET | Freq: Every day | ORAL | Status: DC
  Administered 2023-02-02 – 2023-02-06 (×5): 25 ug via ORAL
  Filled 2023-02-01 (×5): qty 1

## 2023-02-01 MED ORDER — IPRATROPIUM-ALBUTEROL 0.5-2.5 (3) MG/3ML IN SOLN
3.0000 mL | RESPIRATORY_TRACT | Status: DC | PRN
  Administered 2023-02-03 – 2023-02-04 (×3): 3 mL via RESPIRATORY_TRACT
  Filled 2023-02-01 (×3): qty 3

## 2023-02-01 MED ORDER — BUMETANIDE 0.25 MG/ML IJ SOLN
1.0000 mg | Freq: Once | INTRAMUSCULAR | Status: AC
  Administered 2023-02-02: 1 mg via INTRAVENOUS
  Filled 2023-02-01: qty 4

## 2023-02-01 MED ORDER — IPRATROPIUM-ALBUTEROL 0.5-2.5 (3) MG/3ML IN SOLN
3.0000 mL | Freq: Once | RESPIRATORY_TRACT | Status: AC
  Administered 2023-02-02: 3 mL via RESPIRATORY_TRACT
  Filled 2023-02-01: qty 3

## 2023-02-01 MED ORDER — IPRATROPIUM-ALBUTEROL 0.5-2.5 (3) MG/3ML IN SOLN
3.0000 mL | Freq: Once | RESPIRATORY_TRACT | Status: AC
  Administered 2023-02-01: 3 mL via RESPIRATORY_TRACT
  Filled 2023-02-01: qty 3

## 2023-02-01 NOTE — Telephone Encounter (Signed)
Received vm on after-hour line 01/30/23 stating she is has cough and congestion, but not able to have her chest in sunlight. I explained to her that office is closed until Tuesday 02/02/23 and advised her to cover her chest and go to urgent care-Toni

## 2023-02-01 NOTE — Assessment & Plan Note (Signed)
Will continue patient on her Eliquis, atorvastatin, bumetanide, as needed hydralazine.

## 2023-02-01 NOTE — Assessment & Plan Note (Signed)
Continue with DuoNebs and weaning steroid therapy. Supplemental oxygen as needed for goal O2 sats over 90%.

## 2023-02-01 NOTE — H&P (Signed)
History and Physical     Patient: [name] ZOX:[id number] DOB: 05/14/1946 DOA: 02/01/2023 DOS: the patient was seen and examined on 02/02/2023 PCP: Lyndon Code, MD   Patient coming from:  Home  Chief Complaint: Shortness of breath HISTORY OF PRESENT ILLNESS: [name] is an 77 y.o. female shortness of breath patient has been feeling like she has an upper respiratory infection with cough congestion  chest tightness for the past few days. Patient has a history of heart failure, chronic A-fib and pacemaker, PMH of bicuspid AV s/p bioprosthetic AVR & aortic root replacement 2008,  No reports of fevers headache speech or gait issues dizziness falls or any other concerns plaints.  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 s/p bioprosthetic valve replacement 2008 at [facility];  b. 01/2015 Echo: EF 60-65%, no rwma, Gr1 DD, mildly dil LA, nl RV fxn.   Aspiration pneumonia ([name])    Atrial fibrillation with RVR ([name]) 01/11/2015   Atrial flutter ([name])    a. 08/2016 s/p DCCV.  Remains on flecainide 50 mg bid.   Basal skull fracture ([name]) 20 yrs ago   CHF (congestive heart failure) ([name])    Chronic respiratory failure ([name])    COPD (chronic obstructive pulmonary disease) ([name])    a. 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 2008 prior to Aortic aneurysm repair-->nl cors.   History of stress test    a. 10/2015 MV: no ischemia/infarct.   HLD (hyperlipidemia)    HTN (hypertension)    Hypothyroidism    Obesity    PAF (paroxysmal atrial fibrillation) ([name])    a. on Eliquis; b. CHADS2VASc = 3 (HTN, age x 1, female).   Paroxysmal atrial fibrillation ([name]) 01/24/2015   Right upper quadrant abdominal tenderness without rebound tenderness 03/02/2018   S/P ascending aortic aneurysm repair 2008   Review of Systems  Respiratory:   Positive for cough, chest tightness and shortness of breath.   Cardiovascular:  Positive for leg swelling.   Allergies  Allergen Reactions   Benadryl [Diphenhydramine Hcl (Sleep)] Palpitations   Cetirizine Palpitations   Lasix [Furosemide] Rash   Levaquin [Levofloxacin In D5w] Other (See Comments)    Reaction:  Fatigue and muscle soreness   Meloxicam Rash   Soy Allergy Hives and Nausea And Vomiting   Sulfa Antibiotics Rash   Past Surgical History:  Procedure Laterality Date   ABDOMINAL AORTIC ANEURYSM REPAIR  2008   ABDOMINAL HYSTERECTOMY     AORTIC VALVE REPLACEMENT  2008   CARDIAC CATHETERIZATION     [facility]   CARDIOVERSION N/A 08/15/2018   Procedure: CARDIOVERSION;  Surgeon: Iran Ouch, MD;  Location: [facility] ORS;  Service: Cardiovascular;  Laterality: N/A;   CARDIOVERSION N/A 11/14/2018   Procedure: CARDIOVERSION (CATH LAB);  Surgeon: Iran Ouch, MD;  Location: [facility] ORS;  Service: Cardiovascular;  Laterality: N/A;   CARDIOVERSION N/A 06/05/2019   Procedure: CARDIOVERSION;  Surgeon: Iran Ouch, MD;  Location: [facility] OR[facility];  Service: Cardiovascular;  Laterality: N/A;   CARDIOVERSION N/A 08/14/2019   Procedure: CARDIOVERSION;  Surgeon: Iran Ouch, MD;  Location: [facility] OR[facility];  Service: Cardiovascular;  Laterality: N/A;   CARDIOVERSION N/A 11/09/2019   Procedure: CARDIOVERSION;  Surgeon: Marcina Millard, MD;  Location: [facility] ORS;  Service: Cardiovascular;  Laterality: N/A;   CARDIOVERSION N/A 01/17/2020   Procedure: CARDIOVERSION;  Surgeon: Marcina Millard, MD;  Location: [facility] ORS;  Service: Cardiovascular;  Laterality: N/A;   CARPAL TUNNEL RELEASE     ELECTROPHYSIOLOGIC STUDY N/A 08/17/2016   Procedure: Cardioversion;  Surgeon: Iran Ouch, MD;  Location: [facility] ORS;  Service: Cardiovascular;  Laterality: N/A;   TUMOR EXCISION Left    x3 (arm)   MEDICATIONS: (Not in an outpatient encounter)    Current Facility-Administered Medications:    acetaminophen  (TYLENOL) tablet 650 mg, 650 mg, Oral, Q6H PRN **OR** acetaminophen (TYLENOL) suppository 650 mg, 650 mg, Rectal, Q6H PRN, Gertha Calkin, MD   ALPRAZolam Prudy Feeler) tablet 0.25 mg, 0.25 mg, Oral, BID PRN, Gertha Calkin, MD   apixaban (ELIQUIS) tablet 5 mg, 5 mg, Oral, BID, Allena Katz, Eliezer Mccoy, MD   atorvastatin (LIPITOR) tablet 40 mg, 40 mg, Oral, QHS, Constantino Starace V, MD   bumetanide (BUMEX) injection 0.5 mg, 0.5 mg, Intravenous, Q12H, Anjeanette Petzold, Eliezer Mccoy, MD   bumetanide (BUMEX) injection 1 mg, 1 mg, Intravenous, Once, Sharyn Creamer, MD   carvedilol (COREG) tablet 6.25 mg, 6.25 mg, Oral, BID WC, Elius Etheredge V, MD   hydrALAZINE (APRESOLINE) injection 5 mg, 5 mg, Intravenous, Q4H PRN, Allena Katz, Cody Albus V, MD   ipratropium-albuterol (DUONEB) 0.5-2.5 (3) MG/3ML nebulizer solution 3 mL, 3 mL, Inhalation, Q4H PRN, Gertha Calkin, MD   levothyroxine (SYNTHROID) tablet 25 mcg, 25 mcg, Oral, Q0600, Gertha Calkin, MD   sertraline (ZOLOFT) tablet 100 mg, 100 mg, Oral, Daily, Christna Kulick V, MD   sodium chloride flush (NS) 0.9 % injection 3 mL, 3 mL, Intravenous, Q12H, Irena Cords V, MD, 3 mL at 02/02/23 0006  Current Outpatient Medications:    ALPRAZolam (XANAX) 0.25 MG tablet, Take 1 tablet (0.25 mg total) by mouth 2 (two) times daily as needed for anxiety., Disp: 60 tablet, Rfl: 1   amLODipine (NORVASC) 5 MG tablet, Take 1 tablet by mouth in the morning and at bedtime., Disp: , Rfl:    atorvastatin (LIPITOR) 40 MG tablet, TAKE 1 TABLET BY MOUTH EVERY NIGHT AT BEDTIME, Disp: 90 tablet, Rfl: 1   benzonatate (TESSALON) 200 MG capsule, Take 1 capsule (200 mg total) by mouth 2 (two) times daily as needed for cough., Disp: 30 capsule, Rfl: 0   Budeson-Glycopyrrol-Formoterol (BREZTRI AEROSPHERE) 160-9-4.8 MCG/ACT AERO, Inhale 2 puffs into the lungs 2 (two) times daily., Disp: 10.7 g, Rfl: 11   bumetanide (BUMEX) 1 MG tablet, Take 2 tablets (2 mg total) by mouth 2 (two) times daily. And additional 2 tablets PM PRN, Disp: 360 tablet, Rfl:  3   Calcium Carbonate-Vitamin D (CALCIUM 600+D PO), Take 1 tablet by mouth daily., Disp: , Rfl:    carvedilol (COREG) 6.25 MG tablet, Take 1 tablet (6.25 mg total) by mouth 2 (two) times daily with a meal., Disp: 60 tablet, Rfl: 3   Coenzyme Q10 (COQ10) 200 MG CAPS, Take 200 mg by mouth daily., Disp: , Rfl:    diclofenac Sodium (VOLTAREN) 1 % GEL, SMARTSIG:Gram(s) Topical Twice Daily, Disp: , Rfl:    ELIQUIS 5 MG TABS tablet, TAKE 1 TABLET BY MOUTH TWICE DAILY, Disp: 180 tablet, Rfl: 2   ferrous sulfate 325 (65 FE) MG tablet, Take 1 tablet (325 mg total) by mouth daily with breakfast., Disp: , Rfl: 3   ipratropium-albuterol (DUONEB) 0.5-2.5 (3) MG/3ML SOLN, Inhale 3 mLs into the lungs every 4 (four) hours as needed. Inhale 3 mls into  the lungs every 4 to 6 hours and as needed, Disp: 360 mL, Rfl: 3   JARDIANCE 10 MG TABS tablet, TAKE 1 TABLET(10 MG) BY MOUTH DAILY BEFORE BREAKFAST, Disp: 30 tablet, Rfl: 5   levothyroxine (SYNTHROID) 25 MCG tablet, TAKE 1 TABLET BY MOUTH DAILY BEFORE BREAKFAST, Disp: 90 tablet, Rfl: 1   lisinopril (ZESTRIL) 40 MG tablet, TAKE 1 TABLET(40 MG) BY MOUTH DAILY, Disp: 90 tablet, Rfl: 3   loratadine (CLARITIN) 10 MG tablet, Take 10 mg by mouth daily., Disp: , Rfl:    methocarbamol (ROBAXIN) 500 MG tablet, Take 1 tablet (500 mg total) by mouth every 6 (six) hours as needed for muscle spasms., Disp: 90 tablet, Rfl: 2   ondansetron (ZOFRAN-ODT) 4 MG disintegrating tablet, Take 1 tablet (4 mg total) by mouth every 8 (eight) hours as needed for nausea or vomiting., Disp: 20 tablet, Rfl: 0   oxyCODONE-acetaminophen (PERCOCET) 7.5-325 MG tablet, Take one tab po bid for pain severe, Disp: 60 tablet, Rfl: 0   pantoprazole (PROTONIX) 40 MG tablet, TAKE 1 TABLET BY MOUTH EVERY DAY, Disp: 90 tablet, Rfl: 1   potassium chloride (KLOR-CON) 10 MEQ tablet, TAKE 4 TABLETS BY MOUTH TWICE DAILY, Disp: 720 tablet, Rfl: 3   predniSONE (STERAPRED UNI-PAK 21 TAB) 10 MG (21) TBPK tablet, Use as  directed for 6 days, Disp: 21 tablet, Rfl: 0   revefenacin (YUPELRI) 175 MCG/3ML nebulizer solution, Take 3 mLs (175 mcg total) by nebulization daily., Disp: 90 mL, Rfl: 5   sertraline (ZOLOFT) 100 MG tablet, TAKE 1 TABLET BY MOUTH ONCE A DAY FOR ANXIETY, Disp: 90 tablet, Rfl: 2   sodium chloride (OCEAN) 0.65 % SOLN nasal spray, Place 1 spray into both nostrils as needed for congestion., Disp: , Rfl:    traZODone (DESYREL) 50 MG tablet, TAKE 1 TABLET BY MOUTH EVERY DAY AT BEDTIME, Disp: 90 tablet, Rfl: 1   VENTOLIN HFA 108 (90 Base) MCG/ACT inhaler, INHALE 2 PUFFS INTO THE LUNGS EVERY 6 HOURS AS NEEDED FOR WHEEZING OR SHORTNESS OF BREATH, Disp: 18 g, Rfl: 3    ED Course: Pt in Ed alert awake oriented O2 sats of 97%. Vitals:   02/01/23 2023 02/01/23 2024 02/01/23 2027 02/01/23 2040  BP:    (!) 133/55  Pulse: 69 70 70 70  Resp: (!) 22 (!) 22 (!) 21 16  Temp:   98.5 F (36.9 C)   TempSrc: Oral  Oral   SpO2:  97% 97% 96%   No intake/output data recorded. SpO2: 96 % O2 Flow Rate (L/min): 5 L/min Blood work in ed shows: CBC shows hemoglobin of 11.2 normal platelets and white count. Potassium of 3.3 glucose 112 anion gap of 11, troponins normal, elevated BNP at 330.6.  Results for orders placed or performed during the hospital encounter of 02/01/23 (from the past 72 hour(s))  CBC     Status: Abnormal   Collection Time: 02/01/23  8:48 PM  Result Value Ref Range   WBC 5.4 4.0 - 10.5 K/uL   RBC 4.00 3.87 - 5.11 MIL/uL   Hemoglobin 11.2 (L) 12.0 - 15.0 g/dL   HCT 16.1 09.6 - 04.5 %   MCV 90.8 80.0 - 100.0 fL   MCH 28.0 26.0 - 34.0 pg   MCHC 30.9 30.0 - 36.0 g/dL   RDW 40.9 (H) 81.1 - 91.4 %   Platelets 235 150 - 400 K/uL   nRBC 0.0 0.0 - 0.2 %    Comment: Performed at [facility], [address] [place], [place] [address]  Basic metabolic panel     Status: Abnormal   Collection Time: 02/01/23  8:48 PM  Result Value Ref Range   Sodium 140 135 - 145 mmol/L   Potassium 3.3  (L) 3.5 - 5.1 mmol/L   Chloride 98 98 - 111 mmol/L   CO2 31 22 - 32 mmol/L   Glucose, Bld 112 (H) 70 - 99 mg/dL    Comment: Glucose reference range applies only to samples taken after fasting for at least 8 hours.   BUN 18 8 - 23 mg/dL   Creatinine, Ser 4.69 0.44 - 1.00 mg/dL   Calcium 8.8 (L) 8.9 - 10.3 mg/dL   GFR, Estimated >62 >95 mL/min    Comment: (NOTE) Calculated using the CKD-EPI Creatinine Equation (2021)    Anion gap 11 5 - 15    Comment: Performed at [facility], [address], [place], [place] [address]  Troponin I (High Sensitivity)     Status: None   Collection Time: 02/01/23  8:48 PM  Result Value Ref Range   Troponin I (High Sensitivity) 15 <18 ng/L    Comment: (NOTE) Elevated high sensitivity troponin I (hsTnI) values and significant  changes across serial measurements may suggest ACS but many other  chronic and acute conditions are known to elevate hsTnI results.  Refer to the "Links" section for chest pain algorithms and additional  guidance. Performed at [facility], [address] [place], [place] [address]   Brain natriuretic peptide     Status: Abnormal   Collection Time: 02/01/23  8:48 PM  Result Value Ref Range   B Natriuretic Peptide 330.6 (H) 0.0 - 100.0 pg/mL    Comment: Performed at [facility], [address] [place], [place] [address]  Resp Panel by RT-PCR (Flu A&B, Covid) Anterior Nasal Swab     Status: None   Collection Time: 02/01/23  9:03 PM   Specimen: Anterior Nasal Swab  Result Value Ref Range   SARS Coronavirus 2 by RT PCR NEGATIVE NEGATIVE    Comment: (NOTE) SARS-CoV-2 target nucleic acids are NOT DETECTED.  The SARS-CoV-2 RNA is generally detectable in upper respiratory specimens during the acute phase of infection. The lowest concentration of SARS-CoV-2 viral copies this assay can detect is 138 copies/mL. A negative result does not preclude SARS-Cov-2 infection and should not be used as the sole  basis for treatment or other patient management decisions. A negative result may occur with  improper specimen collection/handling, submission of specimen other than nasopharyngeal swab, presence of viral mutation(s) within the areas targeted by this assay, and inadequate number of viral copies(<138 copies/mL). A negative result must be combined with clinical observations, patient history, and epidemiological information. The expected result is Negative.  Fact Sheet for Patients:  BloggerCourse.com  Fact Sheet for Healthcare Providers:  SeriousBroker.it  This test is no t yet approved or cleared by the [place] FDA and  has been authorized for detection and/or diagnosis of SARS-CoV-2 by FDA under an Emergency Use Authorization (EUA). This EUA will remain  in effect (meaning this test can be used) for the duration of the COVID-19 declaration under Section 564(b)(1) of the Act, 21 U.S.C.section 360bbb-3(b)(1), unless the authorization is terminated  or revoked sooner.       Influenza A by PCR NEGATIVE NEGATIVE   Influenza B by PCR NEGATIVE NEGATIVE    Comment: (NOTE) The Xpert Xpress SARS-CoV-2/FLU/RSV plus assay is intended as an aid in  the diagnosis of influenza from Nasopharyngeal swab specimens and should not be used as a sole basis for treatment. Nasal washings and aspirates are unacceptable for Xpert Xpress SARS-CoV-2/FLU/RSV testing.  Fact Sheet for Patients: BloggerCourse.com  Fact Sheet for Healthcare Providers: SeriousBroker.it  This test is not yet approved or cleared by the [place] FDA and has been authorized for detection and/or diagnosis of SARS-CoV-2 by FDA under an Emergency Use Authorization (EUA). This EUA will remain in effect (meaning this test can be used) for the duration of the COVID-19 declaration under Section 564(b)(1) of the Act, 21  U.S.C. section 360bbb-3(b)(1), unless the authorization is terminated or revoked.  Performed at [facility], [address] [place], [place] [address]   Hepatic function panel     Status: None   Collection Time: 02/01/23 10:40 PM  Result Value Ref Range   Total Protein 6.9 6.5 - 8.1 g/dL   Albumin 3.7 3.5 - 5.0 g/dL   AST 20 15 - 41 U/L   ALT 17 0 - 44 U/L   Alkaline Phosphatase 80 38 - 126 U/L   Total Bilirubin 0.6 0.3 - 1.2 mg/dL   Bilirubin, Direct <6.0 0.0 - 0.2 mg/dL   Indirect Bilirubin NOT CALCULATED 0.3 - 0.9 mg/dL    Comment: Performed at [facility], [address] [place], [place] [address]  T4, free     Status: None   Collection Time: 02/01/23 10:40 PM  Result Value Ref Range   Free T4 1.02 0.61 - 1.12 ng/dL    Comment: (NOTE) Biotin ingestion may interfere with free T4 tests. If the results are inconsistent with the TSH level, previous test results, or the clinical presentation, then consider biotin interference. If needed, order repeat testing after stopping biotin. Performed at [facility], [address] [place], [place] [address]   TSH     Status: None   Collection Time: 02/01/23 10:40 PM  Result Value Ref Range   TSH 1.382 0.350 - 4.500 uIU/mL    Comment: Performed by a 3rd Generation assay with a functional sensitivity of <=0.01 uIU/mL. Performed at [facility], [address] [place], [place] [address]     Lab Results  Component Value Date   CREATININE 0.86 02/01/2023   CREATININE 0.86 08/07/2022   CREATININE 0.96 08/06/2022      Latest Ref Rng & Units 02/01/2023   10:40 PM 02/01/2023    8:48 PM 08/07/2022    4:43 AM  CMP  Glucose 70 - 99 mg/dL  562  130   BUN 8 - 23 mg/dL  18  20   Creatinine 8.65 - 1.00 mg/dL  7.84  6.96   Sodium 295 - 145 mmol/L  140  139   Potassium 3.5 - 5.1 mmol/L  3.3  3.5   Chloride 98 - 111 mmol/L  98  102   CO2 22 - 32 mmol/L  31  29   Calcium 8.9 - 10.3 mg/dL  8.8   9.0   Total Protein 6.5 - 8.1 g/dL 6.9     Total Bilirubin 0.3 - 1.2 mg/dL 0.6     Alkaline Phos 38 - 126 U/L 80     AST 15 - 41 U/L 20     ALT 0 - 44 U/L 17       Unresulted Labs (From admission, onward)    None      Pt has received : Orders Placed This Encounter  Procedures  Resp Panel by RT-PCR (Flu A&B, Covid) Anterior Nasal Swab    Standing Status:   Standing    Number of Occurrences:   1   DG Chest Portable 1 View    Standing Status:   Standing    Number of Occurrences:   1    Order Specific Question:   Reason for Exam (SYMPTOM  OR DIAGNOSIS REQUIRED)    Answer:   cough, wheeze, dyspnea   CBC    Standing Status:   Standing    Number of Occurrences:   1   Basic metabolic panel    Standing Status:   Standing    Number of Occurrences:   1   Brain natriuretic peptide    Standing Status:   Standing    Number of Occurrences:   1   Hepatic function panel    Standing Status:   Standing    Number of Occurrences:   1   T4, free    Standing Status:   Standing    Number of Occurrences:   1   TSH    Standing Status:   Standing    Number of Occurrences:   1   Diet heart healthy/carb modified Room service appropriate? Yes; Fluid consistency: Thin; Fluid restriction: 1500 mL Fluid    2 gram sodium restriction    Standing Status:   Standing    Number of Occurrences:   1    Order Specific Question:   Diet-HS Snack?    Answer:   Nothing    Order Specific Question:   Room service appropriate?    Answer:   Yes    Order Specific Question:   Fluid consistency:    Answer:   Thin    Order Specific Question:   Fluid restriction:    Answer:   1500 mL Fluid   Notify physician (specify)    Standing Status:   Standing    Number of Occurrences:   20    Order Specific Question:   Notify Physician    Answer:   for pulse less than 60 and patient is symptomatic    Order Specific Question:   Notify Physician    Answer:   urine output less than 250 ml 4 hours after first diuretic dose  OR less than 1000 ml 24 hours after admission    Order Specific Question:   Notify Physician    Answer:   SBP less than 85    Order Specific Question:   Notify Physician    Answer:   for Glycemic Control (SSI) Order Set if diabetic or glucose >140 mg/dl    Order Specific Question:   Notify Physician    Answer:   for O2 requirements exceeding 6 L/min   Initiate Heart Failure Care Plan    Standing Status:   Standing    Number of Occurrences:   1   Daily weights    Teach patient/caregiver to weigh daily and record on weight chart    Standing Status:   Standing    Number of Occurrences:   1   Strict intake and output    Standing Status:   Standing    Number of Occurrences:   1   In and Out Cath    If patient is unable to collect urine after other methods have failed (i.e., frequent toileting, condom caths), may perform In and Out cath. Follow [place] Urinary Catheter Guidelines    Standing Status:   Standing  Number of Occurrences:   20   Patient education (specify):    Review HF Booklet and HF video with patient and caregivers, and document completion. If patient already has HF booklet, give HF Care Note and weight chart only.    Standing Status:   Standing    Number of Occurrences:   1   Apply Heart Failure Care Plan    Standing Status:   Standing    Number of Occurrences:   1   Cardiac Monitoring Continuous x 24 hours Indications for use: Sub-acute heart failure    Standing Status:   Standing    Number of Occurrences:   1    Order Specific Question:   Indications for use:    Answer:   Sub-acute heart failure   Maintain IV access    Standing Status:   Standing    Number of Occurrences:   1   Vital signs    Standing Status:   Standing    Number of Occurrences:   1   Notify physician (specify)    Standing Status:   Standing    Number of Occurrences:   20    Order Specific Question:   Notify Physician    Answer:   for pulse less than 55 or greater than 120    Order  Specific Question:   Notify Physician    Answer:   for respiratory rate less than 12 or greater than 25    Order Specific Question:   Notify Physician    Answer:   for temperature greater than 100.5 F    Order Specific Question:   Notify Physician    Answer:   for urinary output less than 30 mL/hr for four hours    Order Specific Question:   Notify Physician    Answer:   for systolic BP less than 90 or greater than 160, diastolic BP less than 60 or greater than 100    Order Specific Question:   Notify Physician    Answer:   for new hypoxia w/ oxygen saturations < 88%   Progressive Mobility Protocol: No Restrictions    Standing Status:   Standing    Number of Occurrences:   1   If patient diabetic or glucose greater than 140 notify physician for Sliding Scale Insulin Orders    Standing Status:   Standing    Number of Occurrences:   20   Do not place and if present remove PureWick    Standing Status:   Standing    Number of Occurrences:   1   Initiate Oral Care Protocol    Standing Status:   Standing    Number of Occurrences:   1   Initiate Carrier Fluid Protocol    Standing Status:   Standing    Number of Occurrences:   1   RN may order General Admission PRN Orders utilizing "General Admission PRN medications" (through manage orders) for the following patient needs: allergy symptoms (Claritin), cold sores (Carmex), cough (Robitussin DM), eye irritation (Liquifilm Tears), hemorrhoids (Tucks), indigestion (Maalox), minor skin irritation (Hydrocortisone Cream), muscle pain (Ben Gay), nose irritation (saline nasal spray) and sore throat (Chloraseptic spray).    Standing Status:   Standing    Number of Occurrences:   [phone]   Full code    Standing Status:   Standing    Number of Occurrences:   1    Order Specific Question:   By:    Answer:   Other  Consult to hospitalist    Standing Status:   Standing    Number of Occurrences:   1    Order Specific Question:   Place call to:    Answer:    hospitalist    Order Specific Question:   Reason for Consult    Answer:   Admit    Order Specific Question:   Diagnosis/Clinical Info for Consult:    Answer:   dyspnea, copd exacerbation and chf suspected   Consult to Transition of Care Team    Heart failure home health screen and may place order for PT / OT eval and treat if indicated    Standing Status:   Standing    Number of Occurrences:   1    Order Specific Question:   Reason for Consult:    Answer:   SNF placement    Order Specific Question:   Reason for Consult:    Answer:   Other (see comments)   Nutritional services consult    Standing Status:   Standing    Number of Occurrences:   1    Order Specific Question:   Reason for Consult?    Answer:   nutritional goals   OT eval and treat    Standing Status:   Standing    Number of Occurrences:   1   PT eval and treat    Standing Status:   Standing    Number of Occurrences:   1   Pulse oximetry check with vital signs    Standing Status:   Standing    Number of Occurrences:   1   Oxygen therapy Mode or (Route): Nasal cannula; Liters Per Minute: 2; Keep 02 saturation: greater than 92 %    Standing Status:   Standing    Number of Occurrences:   1    Order Specific Question:   Mode or (Route)    Answer:   Nasal cannula    Order Specific Question:   Liters Per Minute    Answer:   2    Order Specific Question:   Keep 02 saturation    Answer:   greater than 92 %   Incentive spirometry    Standing Status:   Standing    Number of Occurrences:   1   EKG 12-Lead    Standing Status:   Standing    Number of Occurrences:   1   ECHOCARDIOGRAM COMPLETE    Standing Status:   Standing    Number of Occurrences:   1    Order Specific Question:   Perflutren DEFINITY (image enhancing agent) should be administered unless hypersensitivity or allergy exist    Answer:   Administer Perflutren    Order Specific Question:   Will [facility] be the location of this test?    Answer:    Yes    Order Specific Question:   Please indicate who you request to read the nuc med / echo results.    Answer:   [facility] Readers    Order Specific Question:   Reason for exam-Echo    Answer:   CHF-Acute Systolic  I50.21   Insert peripheral IV    Standing Status:   Standing    Number of Occurrences:   1   Admit to Inpatient (patient's expected length of stay will be greater than 2 midnights or inpatient only procedure)    Standing Status:   Standing    Number of Occurrences:   1  Order Specific Question:   Hospital Area    Answer:   [facility] [[id number]]    Order Specific Question:   Level of Care    Answer:   Telemetry Cardiac [103]    Order Specific Question:   Covid Evaluation    Answer:   Confirmed COVID Negative    Order Specific Question:   Diagnosis    Answer:   SOB (shortness of breath) [161096]    Order Specific Question:   Admitting Physician    Answer:   Darrold Junker    Order Specific Question:   Attending Physician    Answer:   Darrold Junker    Order Specific Question:   Certification:    Answer:   I certify this patient will need inpatient services for at least 2 midnights    Order Specific Question:   Estimated Length of Stay    Answer:   3   Aspiration precautions    Standing Status:   Standing    Number of Occurrences:   1   Fall precautions    Standing Status:   Standing    Number of Occurrences:   1    Meds ordered this encounter  Medications   ipratropium-albuterol (DUONEB) 0.5-2.5 (3) MG/3ML nebulizer solution 3 mL   ipratropium-albuterol (DUONEB) 0.5-2.5 (3) MG/3ML nebulizer solution 3 mL   methylPREDNISolone sodium succinate (SOLU-MEDROL) 125 mg/2 mL injection 125 mg   ipratropium-albuterol (DUONEB) 0.5-2.5 (3) MG/3ML nebulizer solution 3 mL   bumetanide (BUMEX) injection 1 mg   ALPRAZolam (XANAX) tablet 0.25 mg   atorvastatin (LIPITOR) tablet 40 mg    TAKE 1 TABLET BY MOUTH EVERY NIGHT AT BEDTIME      apixaban (ELIQUIS) tablet 5 mg   ipratropium-albuterol (DUONEB) 0.5-2.5 (3) MG/3ML nebulizer solution 3 mL    Inhale 3 mls into the lungs every 4 to 6 hours and as needed     levothyroxine (SYNTHROID) tablet 25 mcg   sertraline (ZOLOFT) tablet 100 mg    TAKE 1 TABLET BY MOUTH ONCE A DAY FOR ANXIETY     DISCONTD: albuterol (VENTOLIN HFA) 108 (90 Base) MCG/ACT inhaler 2 puff   sodium chloride flush (NS) 0.9 % injection 3 mL   OR Linked Order Group    acetaminophen (TYLENOL) tablet 650 mg    acetaminophen (TYLENOL) suppository 650 mg   hydrALAZINE (APRESOLINE) injection 5 mg   bumetanide (BUMEX) injection 0.5 mg   carvedilol (COREG) tablet 6.25 mg    Admission Imaging : DG Chest Portable 1 View  Result Date: 02/01/2023 CLINICAL DATA:  Cough, wheezing and dyspnea. EXAM: PORTABLE CHEST 1 VIEW COMPARISON:  Jan 15, 2023 FINDINGS: There is stable dual lead AICD positioning. Multiple sternal wires are noted. The cardiac silhouette is mildly enlarged and unchanged in size. An artificial aortic valve is in place. Diffuse, mild to moderate severity increased interstitial lung markings are seen with mild perihilar prominence of the pulmonary vasculature. Mild atelectatic changes are seen within the bilateral lung bases. No pleural effusion or pneumothorax is identified. Multilevel degenerative changes seen throughout the thoracic spine. IMPRESSION: 1. Evidence of prior median sternotomy with artificial aortic valve placement. 2. Findings consistent with mild to moderate severity interstitial edema. Electronically Signed   By: Aram Candela M.D.   On: 02/01/2023 21:20    Physical Examination: Vitals:   02/01/23 2023 02/01/23 2024 02/01/23 2027 02/01/23 2040  BP:    (!) 133/55  Pulse: 69 70 70 70  Temp:   98.5 F (36.9 C)   Resp: (!) 22 (!) 22 (!) 21 16  SpO2:  97% 97% 96%  TempSrc: Oral  Oral    Physical Exam Vitals and nursing note reviewed.  Constitutional:      General: She is not in acute  distress. HENT:     Head: Normocephalic and atraumatic.     Right Ear: Hearing normal.     Left Ear: Hearing normal.     Nose: Nose normal. No nasal deformity.     Mouth/Throat:     Lips: Pink.     Tongue: No lesions.     Pharynx: Oropharynx is clear.  Eyes:     General: Lids are normal.     Extraocular Movements: Extraocular movements intact.  Cardiovascular:     Rate and Rhythm: Normal rate and regular rhythm.     Heart sounds: Normal heart sounds.  Pulmonary:     Effort: Pulmonary effort is normal.     Breath sounds: Rales present.  Abdominal:     General: Bowel sounds are normal. There is no distension.     Palpations: Abdomen is soft. There is no mass.     Tenderness: There is no abdominal tenderness.  Musculoskeletal:     Right lower leg: Edema present.     Left lower leg: Edema present.  Skin:    General: Skin is warm.  Neurological:     General: No focal deficit present.     Mental Status: She is alert and oriented to person, place, and time.     Cranial Nerves: Cranial nerves 2-12 are intact.  Psychiatric:        Attention and Perception: Attention normal.        Mood and Affect: Mood normal.        Speech: Speech normal.        Behavior: Behavior normal. Behavior is cooperative.     Assessment and Plan: * SOB (shortness of breath) Continue with DuoNebs and weaning steroid therapy. Supplemental oxygen as needed for goal O2 sats over 90%.  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  Chronic obstructive pulmonary disease (COPD) ([name]) Patient started on DuoNebs and Solu-Medrol in the emergency room. Will continue with supplemental oxygen and taper steroid therapy.   Atrial fibrillation, chronic ([name]) EKG done today shows: Paced rhythm at 70 bpm.     Essential hypertension We will currently hold patient's lisinopril, amlodipine and  continue patient on Coreg and Bumex. Daily weights, strict I's and O's. 2D echocardiogram to assess ejection fraction and wall motion and valvular abnormalities. Cardiology consult as deemed appropriate. OSA eval once discharged on outpatient basis by primary care provider.  Hypothyroidism Free T4 and TSH. Continue levothyroxine at 25 mcg.  History of aortic valve replacement with bioprosthetic valve Patient is currently on Eliquis for anticoagulation.        There are no diagnoses linked to this encounter.  DVT prophylaxis:  Eliquis Code Status:  Full code    01/05/2023    1:43 PM  Advanced Directives  Does Patient Have a Medical Advance Directive? Yes  Type of Estate agent of [place];Living will  Does patient want to make changes to medical advance directive? No - Patient declined  Copy of Healthcare Power of Attorney in Chart? No - copy requested   Family Communication:  None Emergency Contact: Contact Information  Name Relation Home Work Mobile   [place] Son [phone]   Lutricia Horsfall   [phone]      Disposition Plan:  Home Consults: None Admission status: Observation . Unit / Expected LOS: Cardiac telemetry/1 to 2 days. Gertha Calkin MD [facility]  6 PM- 2 AM. [phone] Please use [email or web address] OR call [facility] Admits & Consults @ [phone] to contact current Assigned [facility] Attending/Consulting MD for this patient.

## 2023-02-01 NOTE — Assessment & Plan Note (Signed)
Patient is currently on Eliquis for anticoagulation.

## 2023-02-01 NOTE — ED Provider Notes (Signed)
[facility] Provider Note    Event Date/Time   First MD Initiated Contact with Patient 02/01/23 2036     (approximate)   History   Respiratory Distress (From home, c/o congestion x 2 days, went to urgent care but left due to wait. today c/o central CP radiating to abdomen/ SHOB. Hx of COPD/ pacemaker. EMS gave Neb x 1, Nitro topical. )   HPI  KONNOR VANBERGEN is a 77 y.o. female with a history of multiple medical conditions including congestive heart failure, bioprosthetic valve, COPD,   Nasal congestion cough and increasing shortness of breath worse for the last 2 days.  Visit in urgent care, on 2 occasions but was not seen by provider.  She presents today due to ongoing cough shortness of breath worsening with exertion.  No noted fever.  Currently takes Bumex and uses albuterol inhaler.  She does use oxygen 5 L at baseline  Physical Exam   Triage Vital Signs: ED Triage Vitals  Enc Vitals Group     BP --      Pulse Rate 02/01/23 2023 69     Resp 02/01/23 2023 (!) 22     Temp 02/01/23 2027 98.5 F (36.9 C)     Temp Source 02/01/23 2023 Oral     SpO2 02/01/23 2024 97 %     Weight --      Height --      Head Circumference --      Peak Flow --      Pain Score 02/01/23 2023 5     Pain Loc --      Pain Edu? --      Excl. in GC? --     Most recent vital signs: Vitals:   02/05/23 1935 02/05/23 2017  BP: (!) 139/56   Pulse: 70   Resp: 18   Temp: 98.4 F (36.9 C)   SpO2: 98% 96%     General: Awake, no distress.  CV:  Good peripheral perfusion.  Normal tones and rate Resp:  Slightly increased work of breathing.  There is notable expiratory reels versus wheezing.  Somewhat hard to delineate.  Sounds almost like a combination of both. Abd:  No distention.  Other:  No venous cords or congestion or extremities bilaterally   ED Results / Procedures / Treatments   Labs (all labs ordered are listed, but only abnormal results are displayed) Labs  Reviewed  RESPIRATORY PANEL BY PCR - Abnormal; Notable for the following components:      Result Value   Metapneumovirus DETECTED (*)    All other components within normal limits  CBC - Abnormal; Notable for the following components:   Hemoglobin 11.2 (*)    RDW 16.4 (*)    All other components within normal limits  BASIC METABOLIC PANEL - Abnormal; Notable for the following components:   Potassium 3.3 (*)    Glucose, Bld 112 (*)    Calcium 8.8 (*)    All other components within normal limits  BRAIN NATRIURETIC PEPTIDE - Abnormal; Notable for the following components:   B Natriuretic Peptide 330.6 (*)    All other components within normal limits  BASIC METABOLIC PANEL - Abnormal; Notable for the following components:   Potassium 3.1 (*)    Chloride 95 (*)    Glucose, Bld 130 (*)    All other components within normal limits  BASIC METABOLIC PANEL - Abnormal; Notable for the following components:   Potassium 3.3 (*)  All other components within normal limits  BASIC METABOLIC PANEL - Abnormal; Notable for the following components:   Glucose, Bld 145 (*)    Calcium 8.8 (*)    All other components within normal limits  BASIC METABOLIC PANEL - Abnormal; Notable for the following components:   Calcium 8.5 (*)    All other components within normal limits  RESP PANEL BY RT-PCR (FLU A&B, COVID) ARPGX2  HEPATIC FUNCTION PANEL  T4, FREE  TSH  MAGNESIUM  MAGNESIUM  BASIC METABOLIC PANEL  TROPONIN I (HIGH SENSITIVITY)     EKG   Ventricular paced rhythm.  No evidence of acute ischemia   RADIOLOGY   Diffuse interstitial disease, possible pulmonary edema.  No obvious focal consolidation  PROCEDURES:  Critical Care performed: No  Procedures   MEDICATIONS ORDERED IN ED: Medications  ALPRAZolam (XANAX) tablet 0.25 mg (0.25 mg Oral Given 02/04/23 2114)  atorvastatin (LIPITOR) tablet 40 mg (40 mg Oral Given 02/05/23 2056)  apixaban (ELIQUIS) tablet 5 mg (5 mg Oral Given  02/05/23 2056)  ipratropium-albuterol (DUONEB) 0.5-2.5 (3) MG/3ML nebulizer solution 3 mL (3 mLs Inhalation Given 02/04/23 2112)  levothyroxine (SYNTHROID) tablet 25 mcg (25 mcg Oral Given 02/05/23 0546)  sertraline (ZOLOFT) tablet 100 mg (100 mg Oral Given 02/05/23 0837)  sodium chloride flush (NS) 0.9 % injection 3 mL (3 mLs Intravenous Given 02/05/23 0850)  acetaminophen (TYLENOL) tablet 650 mg (has no administration in time range)    Or  acetaminophen (TYLENOL) suppository 650 mg (has no administration in time range)  hydrALAZINE (APRESOLINE) injection 5 mg (5 mg Intravenous Given 02/03/23 0234)  lisinopril (ZESTRIL) tablet 40 mg (40 mg Oral Given 02/05/23 0837)  amLODipine (NORVASC) tablet 5 mg (5 mg Oral Given 02/05/23 0837)  traZODone (DESYREL) tablet 50 mg (50 mg Oral Given 02/05/23 2056)  pantoprazole (PROTONIX) EC tablet 40 mg (40 mg Oral Given 02/05/23 0837)  methocarbamol (ROBAXIN) tablet 500 mg (has no administration in time range)  benzonatate (TESSALON) capsule 200 mg (200 mg Oral Given 02/02/23 1640)  budesonide (PULMICORT) nebulizer solution 0.25 mg (0.25 mg Nebulization Given 02/05/23 2016)  arformoterol (BROVANA) nebulizer solution 15 mcg (15 mcg Nebulization Given 02/05/23 2016)  umeclidinium bromide (INCRUSE ELLIPTA) 62.5 MCG/ACT 1 puff (1 puff Inhalation Given 02/05/23 0850)  carvedilol (COREG) tablet 18.75 mg (18.75 mg Oral Given 02/05/23 1607)  oxyCODONE-acetaminophen (PERCOCET) 7.5-325 MG per tablet 1 tablet (1 tablet Oral Given 02/05/23 0848)  sodium chloride (OCEAN) 0.65 % nasal spray 1 spray (1 spray Each Nare Given 02/04/23 2116)  guaiFENesin-dextromethorphan (ROBITUSSIN DM) 100-10 MG/5ML syrup 5 mL (5 mLs Oral Given 02/05/23 2056)  potassium chloride SA (KLOR-CON M) CR tablet 40 mEq (40 mEq Oral Given 02/05/23 2057)  fluticasone (FLONASE) 50 MCG/ACT nasal spray 2 spray (2 sprays Each Nare Given 02/05/23 0838)  phenylephrine (NEO-SYNEPHRINE) 0.25 % nasal spray 1 spray (1 spray Each  Nare Given 02/05/23 0838)  loratadine (CLARITIN) tablet 10 mg (10 mg Oral Given 02/05/23 0837)  bumetanide (BUMEX) injection 2 mg (2 mg Intravenous Given 02/05/23 2057)  ipratropium-albuterol (DUONEB) 0.5-2.5 (3) MG/3ML nebulizer solution 3 mL (3 mLs Nebulization Given 02/01/23 2059)  ipratropium-albuterol (DUONEB) 0.5-2.5 (3) MG/3ML nebulizer solution 3 mL (3 mLs Nebulization Given 02/01/23 2059)  methylPREDNISolone sodium succinate (SOLU-MEDROL) 125 mg/2 mL injection 125 mg (125 mg Intravenous Given 02/01/23 2056)  ipratropium-albuterol (DUONEB) 0.5-2.5 (3) MG/3ML nebulizer solution 3 mL (3 mLs Nebulization Given 02/02/23 0006)  bumetanide (BUMEX) injection 1 mg (1 mg Intravenous Given 02/02/23 0041)  carvedilol (  COREG) tablet 12.5 mg (12.5 mg Oral Given 02/02/23 0915)     IMPRESSION / MDM / ASSESSMENT AND PLAN / ED COURSE  I reviewed the triage vital signs and the nursing notes.                              Differential diagnosis includes, but is not limited to, CHF exacerbation especially given her normal white count elevated BNP, lack of fever.  No obvious infectious symptoms other than she reports cough nasal congestion.  Chest x-ray and clinical examination suggest possible COPD exacerbation or CHF exacerbation.  She is currently taking Bumex reports an allergy to furosemide.  No associated chest pain.  No obvious features of ACS.  She is already anticoagulated lowering risk for PE.  No evidence of pneumothorax or focal infiltrate on x-ray.  After receiving respiratory treatments, patient continues to have wheezing and dyspnea.  She does report some improvement.  However given the nature of her history, dependence on 5 L, suspected minimal reserve capacity, as well as ongoing symptoms despite treatment discussed in consult with the hospitalist who is excepting the patient  Patient's presentation is most consistent with acute complicated illness / injury requiring diagnostic workup.   The  patient is on the cardiac monitor to evaluate for evidence of arrhythmia and/or significant heart rate changes.       FINAL CLINICAL IMPRESSION(S) / ED DIAGNOSES   Final diagnoses:  Shortness of breath     Rx / DC Orders   ED Discharge Orders     None        Note:  This document was prepared using Dragon voice recognition software and may include unintentional dictation errors.   Sharyn Creamer, MD 02/05/23 [phone]

## 2023-02-01 NOTE — Assessment & Plan Note (Signed)
EKG done today shows: sinus rhythm at 70 with PVCs paced rhytthm

## 2023-02-01 NOTE — Assessment & Plan Note (Signed)
We will currently hold patient's lisinopril, amlodipine and continue patient on Coreg and Bumex. Daily weights, strict I's and O's. 2D echocardiogram to assess ejection fraction and wall motion and valvular abnormalities. Cardiology consult as deemed appropriate. OSA eval once discharged on outpatient basis by primary care provider.

## 2023-02-01 NOTE — Hospital Course (Signed)
2 d echo 11/2021: 1. Left ventricular ejection fraction, by estimation, is 60 to 65% . The left ventricle has normal function. The left ventricle demonstrates regional wall motion abnormalities ( see scoring diagram/ findings for description) . 2. Right ventricular systolic function was not well visualized. The right ventricular size is mildly enlarged. 3. The mitral valve was not well visualized. No evidence of mitral valve regurgitation. 4. The aortic valve was not well visualized. Aortic valve regurgitation is not visualized.

## 2023-02-01 NOTE — Assessment & Plan Note (Signed)
Free T4 and TSH. Continue levothyroxine at 25 mcg.

## 2023-02-01 NOTE — Assessment & Plan Note (Signed)
Patient started on DuoNebs and Solu-Medrol in the emergency room. Will continue with supplemental oxygen and taper steroid therapy.

## 2023-02-02 ENCOUNTER — Other Ambulatory Visit: Payer: Self-pay

## 2023-02-02 ENCOUNTER — Ambulatory Visit: Payer: Medicare Other

## 2023-02-02 ENCOUNTER — Other Ambulatory Visit: Payer: Medicare Other

## 2023-02-02 ENCOUNTER — Encounter: Payer: Self-pay | Admitting: Internal Medicine

## 2023-02-02 DIAGNOSIS — I5033 Acute on chronic diastolic (congestive) heart failure: Secondary | ICD-10-CM

## 2023-02-02 DIAGNOSIS — I38 Endocarditis, valve unspecified: Secondary | ICD-10-CM | POA: Diagnosis not present

## 2023-02-02 LAB — BASIC METABOLIC PANEL
Anion gap: 12 (ref 5–15)
BUN: 20 mg/dL (ref 8–23)
CO2: 29 mmol/L (ref 22–32)
Calcium: 9.1 mg/dL (ref 8.9–10.3)
Chloride: 95 mmol/L — ABNORMAL LOW (ref 98–111)
Creatinine, Ser: 0.7 mg/dL (ref 0.44–1.00)
GFR, Estimated: >60 mL/min (ref >60)
Glucose, Bld: 130 mg/dL — ABNORMAL HIGH (ref 70–99)
Potassium: 3.1 mmol/L — ABNORMAL LOW (ref 3.5–5.1)
Sodium: 136 mmol/L (ref 135–145)

## 2023-02-02 LAB — HEPATIC FUNCTION PANEL
ALT: 17 U/L (ref 0–44)
AST: 20 U/L (ref 15–41)
Albumin: 3.7 g/dL (ref 3.5–5.0)
Alkaline Phosphatase: 80 U/L (ref 38–126)
Bilirubin, Direct: <0.1 mg/dL (ref 0.0–0.2)
Total Bilirubin: 0.6 mg/dL (ref 0.3–1.2)
Total Protein: 6.9 g/dL (ref 6.5–8.1)

## 2023-02-02 LAB — TSH: TSH: 1.382 u[IU]/mL (ref 0.350–4.500)

## 2023-02-02 LAB — T4, FREE: Free T4: 1.02 ng/dL (ref 0.61–1.12)

## 2023-02-02 LAB — MAGNESIUM: Magnesium: 2.2 mg/dL (ref 1.7–2.4)

## 2023-02-02 MED ORDER — CARVEDILOL 6.25 MG PO TABS
18.7500 mg | ORAL_TABLET | Freq: Two times a day (BID) | ORAL | Status: DC
  Administered 2023-02-02 – 2023-02-06 (×7): 18.75 mg via ORAL
  Filled 2023-02-02 (×6): qty 1
  Filled 2023-02-02: qty 3
  Filled 2023-02-02: qty 1

## 2023-02-02 MED ORDER — BUDESONIDE 0.25 MG/2ML IN SUSP
0.2500 mg | Freq: Two times a day (BID) | RESPIRATORY_TRACT | Status: DC
  Administered 2023-02-02 – 2023-02-06 (×9): 0.25 mg via RESPIRATORY_TRACT
  Filled 2023-02-02 (×9): qty 2

## 2023-02-02 MED ORDER — POTASSIUM CHLORIDE CRYS ER 20 MEQ PO TBCR
40.0000 meq | EXTENDED_RELEASE_TABLET | Freq: Every day | ORAL | Status: DC
  Administered 2023-02-02: 40 meq via ORAL
  Filled 2023-02-02: qty 2

## 2023-02-02 MED ORDER — POTASSIUM CHLORIDE CRYS ER 20 MEQ PO TBCR: 40.0000 meq | EXTENDED_RELEASE_TABLET | Freq: Once | ORAL | Status: DC

## 2023-02-02 MED ORDER — METHOCARBAMOL 500 MG PO TABS: 500.0000 mg | ORAL_TABLET | Freq: Four times a day (QID) | ORAL | Status: DC | PRN

## 2023-02-02 MED ORDER — LISINOPRIL 20 MG PO TABS
40.0000 mg | ORAL_TABLET | Freq: Every day | ORAL | Status: DC
  Administered 2023-02-02 – 2023-02-06 (×5): 40 mg via ORAL
  Filled 2023-02-02 (×2): qty 2
  Filled 2023-02-02: qty 4
  Filled 2023-02-02 (×2): qty 2

## 2023-02-02 MED ORDER — BENZONATATE 100 MG PO CAPS
200.0000 mg | ORAL_CAPSULE | Freq: Two times a day (BID) | ORAL | Status: DC | PRN
  Administered 2023-02-02 (×2): 200 mg via ORAL
  Filled 2023-02-02 (×2): qty 2

## 2023-02-02 MED ORDER — TRAZODONE HCL 50 MG PO TABS
50.0000 mg | ORAL_TABLET | Freq: Every day | ORAL | Status: DC
  Administered 2023-02-02 – 2023-02-05 (×4): 50 mg via ORAL
  Filled 2023-02-02 (×4): qty 1

## 2023-02-02 MED ORDER — SALINE SPRAY 0.65 % NA SOLN
1.0000 | NASAL | Status: DC | PRN
  Administered 2023-02-02 – 2023-02-04 (×5): 1 via NASAL
  Filled 2023-02-02: qty 44

## 2023-02-02 MED ORDER — BUDESON-GLYCOPYRROL-FORMOTEROL 160-9-4.8 MCG/ACT IN AERO: 2.0000 | INHALATION_SPRAY | Freq: Two times a day (BID) | RESPIRATORY_TRACT | Status: DC

## 2023-02-02 MED ORDER — CARVEDILOL 6.25 MG PO TABS
6.2500 mg | ORAL_TABLET | Freq: Two times a day (BID) | ORAL | Status: DC
  Administered 2023-02-02: 6.25 mg via ORAL
  Filled 2023-02-02: qty 1

## 2023-02-02 MED ORDER — ARFORMOTEROL TARTRATE 15 MCG/2ML IN NEBU
15.0000 ug | INHALATION_SOLUTION | Freq: Two times a day (BID) | RESPIRATORY_TRACT | Status: DC
  Administered 2023-02-02 – 2023-02-06 (×7): 15 ug via RESPIRATORY_TRACT
  Filled 2023-02-02 (×11): qty 2

## 2023-02-02 MED ORDER — OXYCODONE-ACETAMINOPHEN 7.5-325 MG PO TABS
1.0000 | ORAL_TABLET | Freq: Two times a day (BID) | ORAL | Status: DC | PRN
  Administered 2023-02-02 – 2023-02-06 (×6): 1 via ORAL
  Filled 2023-02-02 (×6): qty 1

## 2023-02-02 MED ORDER — PANTOPRAZOLE SODIUM 40 MG PO TBEC
40.0000 mg | DELAYED_RELEASE_TABLET | Freq: Every day | ORAL | Status: DC
  Administered 2023-02-02 – 2023-02-06 (×5): 40 mg via ORAL
  Filled 2023-02-02 (×5): qty 1

## 2023-02-02 MED ORDER — GUAIFENESIN-DM 100-10 MG/5ML PO SYRP: 5.0000 mL | ORAL_SOLUTION | Freq: Four times a day (QID) | ORAL | Status: DC | PRN

## 2023-02-02 MED ORDER — AMLODIPINE BESYLATE 5 MG PO TABS
5.0000 mg | ORAL_TABLET | Freq: Every day | ORAL | Status: DC
  Administered 2023-02-02 – 2023-02-06 (×5): 5 mg via ORAL
  Filled 2023-02-02 (×5): qty 1

## 2023-02-02 MED ORDER — GUAIFENESIN-DM 100-10 MG/5ML PO SYRP
5.0000 mL | ORAL_SOLUTION | ORAL | Status: DC | PRN
  Administered 2023-02-02 – 2023-02-06 (×9): 5 mL via ORAL
  Filled 2023-02-02 (×9): qty 10

## 2023-02-02 MED ORDER — CARVEDILOL 6.25 MG PO TABS
12.5000 mg | ORAL_TABLET | Freq: Once | ORAL | Status: AC
  Administered 2023-02-02: 12.5 mg via ORAL
  Filled 2023-02-02: qty 2

## 2023-02-02 MED ORDER — UMECLIDINIUM BROMIDE 62.5 MCG/ACT IN AEPB
1.0000 | INHALATION_SPRAY | Freq: Every day | RESPIRATORY_TRACT | Status: DC
  Administered 2023-02-03 – 2023-02-06 (×4): 1 via RESPIRATORY_TRACT
  Filled 2023-02-02: qty 7

## 2023-02-02 NOTE — ED Notes (Signed)
Pt ambulatory to bathroom independently with steady gait.

## 2023-02-02 NOTE — ED Notes (Signed)
Pt complaining "I hate this place!" And complaining that coffee isn't good, food isn't good, she was coughing last night and they never gave her cough medicine, IV is in the wrong place, etc. Helped pt up to toilet, pt requests oxygen extension tubing so doesn't have to use rolling tank. Gave breakfast tray.Offered to change IV site. Will look for oxygen extension tubing.

## 2023-02-02 NOTE — ED Notes (Signed)
Pt refused echocardiogram. Pt refuses to get in bed. Is sitting on bench.

## 2023-02-02 NOTE — Progress Notes (Addendum)
Progress Note    [name]  BMW:[id number] DOB: 01/19/1946  DOA: 02/01/2023 PCP: Lyndon Code, MD      Brief Narrative:    Medical records reviewed and are as summarized below:  [name] is a 77 y.o. female with multiple medical problems including but not limited to aortic stenosis s/p aortic valve replacement with bioprosthetic valve in 2008, s/p TAVR on 11/03/2022 at [facility], ascending aortic aneurysm s/p repair, history of AV node ablation and dual-chamber pacemaker placement in June 2021, paroxysmal atrial fibrillation s/p multiple cardioversions, severe COPD, chronic diastolic CHF, chronic hypoxic respiratory failure on 5 L/min oxygen via [place], stroke, right upper lobe lung cancer, hypertension, hyperlipidemia, anxiety, arthritis, sleep apnea, hearing impairment, chronic anemia.  He presented to the hospital with cough, congestion, shortness of breath, chest tightness and leg swelling.  She was admitted to the hospital for CHF exacerbation.  She was treated with IV bumetanide.    Assessment/Plan:   Principal Problem:   Heart failure due to valvular disease, acute on chronic, diastolic ([name]) Active Problems:   SOB (shortness of breath)   Chronic obstructive pulmonary disease (COPD) ([name])   Atrial fibrillation, chronic ([name])   Essential hypertension   Hypothyroidism   History of aortic valve replacement with bioprosthetic valve   Hypokalemia    PLAN  Acute exacerbation of chronic diastolic CHF s/p TAVR in February 2024 for aortic stenosis, mild mitral regurgitation, moderate tricuspid regurgitation: Continue IV bumetanide.  Tessalon Perles as needed for cough monitor BMP, daily weight and urine output.  Unfortunately, patient refused labs today. 2D echo on 11/04/2022 showed normal LV function, moderate LVH, mild MR, moderate TR   Hypokalemia: Replete potassium and monitor levels   COPD and chronic hypoxic respiratory failure: Continue bronchodilators.   She said she uses 5 L/min oxygen via [place].   Right upper lobe lung cancer, stage I: She's had 3 sessions of radiation therapy.  She said she was due for 2 more this week and that she does not think she can make it.  Outpatient follow-up with oncologist and radiation oncologist.   Permanent atrial fibrillation: Continue Eliquis   Hypertension: Continue antihypertensives.  She said her carvedilol was recently increased from 1 tablet of 6.25 mg to 3 tablets of 6.25 mg.    Diet Order             Diet heart healthy/carb modified Room service appropriate? Yes; Fluid consistency: Thin; Fluid restriction: 1500 mL Fluid  Diet effective now                            Consultants: None  Procedures: None    Medications:    amLODipine  5 mg Oral Daily   apixaban  5 mg Oral BID   arformoterol  15 mcg Nebulization BID   atorvastatin  40 mg Oral QHS   budesonide (PULMICORT) nebulizer solution  0.25 mg Nebulization BID   bumetanide (BUMEX) IV  0.5 mg Intravenous Q12H   carvedilol  18.75 mg Oral BID WC   levothyroxine  25 mcg Oral Q0600   lisinopril  40 mg Oral Daily   pantoprazole  40 mg Oral Daily   potassium chloride  40 mEq Oral Daily   sertraline  100 mg Oral Daily   sodium chloride flush  3 mL Intravenous Q12H   traZODone  50 mg Oral QHS   umeclidinium bromide  1 puff Inhalation Daily  Continuous Infusions:   Anti-infectives (From admission, onward)    None              Family Communication/Anticipated D/C date and plan/Code Status   DVT prophylaxis:  apixaban (ELIQUIS) tablet 5 mg     Code Status: Full Code  Family Communication: None Disposition Plan: Plan to discharge home in 2 to 3 days   Status is: Inpatient Remains inpatient appropriate because: CHF exacerbation on IV bumetanide       Subjective:   C/o cough and SOB. 5L at baseline. Lung cancer on radiation, had 3 but left with 2 treatment  Objective:    Vitals:    02/02/23 0730 02/02/23 0915 02/02/23 0925 02/02/23 0930  BP:   (!) 151/92 (!) 151/75  Pulse: 79 78 77 80  Resp: 17 17 15 20   Temp:      TempSrc:      SpO2: 99% 96% 98% 100%   No data found.  No intake or output data in the 24 hours ending 02/02/23 1124 There were no vitals filed for this visit.  Exam:  GEN: NAD SKIN: Multiple hyperpigmented plaques on the back EYES: No pallor or icterus ENT: MMM CV: RRR PULM: B/l wheezing, bibasilar rales ABD: soft, distended/obese, NT, +BS CNS: AAO x 3, non focal EXT: Mild b/l leg edema, no tenderness        Data Reviewed:   I have personally reviewed following labs and imaging studies:  Labs: Labs show the following:   Basic Metabolic Panel: Recent Labs  Lab 02/01/23 2048  NA 140  K 3.3*  CL 98  CO2 31  GLUCOSE 112*  BUN 18  CREATININE 0.86  CALCIUM 8.8*   GFR CrCl cannot be calculated (Unknown ideal weight.). Liver Function Tests: Recent Labs  Lab 02/01/23 2240  AST 20  ALT 17  ALKPHOS 80  BILITOT 0.6  PROT 6.9  ALBUMIN 3.7   No results for input(s): "LIPASE", "AMYLASE" in the last 168 hours. No results for input(s): "AMMONIA" in the last 168 hours. Coagulation profile No results for input(s): "INR", "PROTIME" in the last 168 hours.  CBC: Recent Labs  Lab 02/01/23 2048  WBC 5.4  HGB 11.2*  HCT 36.3  MCV 90.8  PLT 235   Cardiac Enzymes: No results for input(s): "CKTOTAL", "CKMB", "CKMBINDEX", "TROPONINI" in the last 168 hours. BNP (last 3 results) No results for input(s): "PROBNP" in the last 8760 hours. CBG: No results for input(s): "GLUCAP" in the last 168 hours. D-Dimer: No results for input(s): "DDIMER" in the last 72 hours. Hgb A1c: No results for input(s): "HGBA1C" in the last 72 hours. Lipid Profile: No results for input(s): "CHOL", "HDL", "LDLCALC", "TRIG", "CHOLHDL", "LDLDIRECT" in the last 72 hours. Thyroid function studies: Recent Labs    02/01/23 2240  TSH 1.382   Anemia  work up: No results for input(s): "VITAMINB12", "FOLATE", "FERRITIN", "TIBC", "IRON", "RETICCTPCT" in the last 72 hours. Sepsis Labs: Recent Labs  Lab 02/01/23 2048  WBC 5.4    Microbiology Recent Results (from the past 240 hour(s))  Resp Panel by RT-PCR (Flu A&B, Covid) Anterior Nasal Swab     Status: None   Collection Time: 02/01/23  9:03 PM   Specimen: Anterior Nasal Swab  Result Value Ref Range Status   SARS Coronavirus 2 by RT PCR NEGATIVE NEGATIVE Final    Comment: (NOTE) SARS-CoV-2 target nucleic acids are NOT DETECTED.  The SARS-CoV-2 RNA is generally detectable in upper respiratory specimens during the acute  phase of infection. The lowest concentration of SARS-CoV-2 viral copies this assay can detect is 138 copies/mL. A negative result does not preclude SARS-Cov-2 infection and should not be used as the sole basis for treatment or other patient management decisions. A negative result may occur with  improper specimen collection/handling, submission of specimen other than nasopharyngeal swab, presence of viral mutation(s) within the areas targeted by this assay, and inadequate number of viral copies(<138 copies/mL). A negative result must be combined with clinical observations, patient history, and epidemiological information. The expected result is Negative.  Fact Sheet for Patients:  BloggerCourse.com  Fact Sheet for Healthcare Providers:  SeriousBroker.it  This test is no t yet approved or cleared by the [place] FDA and  has been authorized for detection and/or diagnosis of SARS-CoV-2 by FDA under an Emergency Use Authorization (EUA). This EUA will remain  in effect (meaning this test can be used) for the duration of the COVID-19 declaration under Section 564(b)(1) of the Act, 21 U.S.C.section 360bbb-3(b)(1), unless the authorization is terminated  or revoked sooner.       Influenza A by PCR NEGATIVE  NEGATIVE Final   Influenza B by PCR NEGATIVE NEGATIVE Final    Comment: (NOTE) The Xpert Xpress SARS-CoV-2/FLU/RSV plus assay is intended as an aid in the diagnosis of influenza from Nasopharyngeal swab specimens and should not be used as a sole basis for treatment. Nasal washings and aspirates are unacceptable for Xpert Xpress SARS-CoV-2/FLU/RSV testing.  Fact Sheet for Patients: BloggerCourse.com  Fact Sheet for Healthcare Providers: SeriousBroker.it  This test is not yet approved or cleared by the [place] FDA and has been authorized for detection and/or diagnosis of SARS-CoV-2 by FDA under an Emergency Use Authorization (EUA). This EUA will remain in effect (meaning this test can be used) for the duration of the COVID-19 declaration under Section 564(b)(1) of the Act, 21 U.S.C. section 360bbb-3(b)(1), unless the authorization is terminated or revoked.  Performed at [facility], [address] [place], [place] [address]     Procedures and diagnostic studies:  DG Chest Portable 1 View  Result Date: 02/01/2023 CLINICAL DATA:  Cough, wheezing and dyspnea. EXAM: PORTABLE CHEST 1 VIEW COMPARISON:  Jan 15, 2023 FINDINGS: There is stable dual lead AICD positioning. Multiple sternal wires are noted. The cardiac silhouette is mildly enlarged and unchanged in size. An artificial aortic valve is in place. Diffuse, mild to moderate severity increased interstitial lung markings are seen with mild perihilar prominence of the pulmonary vasculature. Mild atelectatic changes are seen within the bilateral lung bases. No pleural effusion or pneumothorax is identified. Multilevel degenerative changes seen throughout the thoracic spine. IMPRESSION: 1. Evidence of prior median sternotomy with artificial aortic valve placement. 2. Findings consistent with mild to moderate severity interstitial edema. Electronically Signed   By: Aram Candela M.D.   On: 02/01/2023 21:20               LOS: 1 day   Qiara Minetti  [facility]   Pager on [email or web address]. If 7PM-7AM, please contact night-coverage at [email or web address]     02/02/2023, 11:24 AM         '

## 2023-02-02 NOTE — ED Notes (Signed)
Pt returned to room c/o feeling more short of breath after exertion. SpO2 wnl on oxygen at 5lpm.

## 2023-02-02 NOTE — Evaluation (Signed)
Physical Therapy Evaluation Patient Details Name: [name] MRN: [id number] DOB: 14-Feb-1946 Today's Date: 02/02/2023  History of Present Illness  Patient is a 77 year old female with shortness of breath. History of heart failure, chronic A-fib, pacemaker, AVR & aortic root replacement. On 5L02 at baseline  Clinical Impression  Patient is agreeable to PT. She reports she is Mod I with ambulation, using a rolling walker or rollator and 5 L02 required at baseline. She lives in an apartment with her granddaughter with no steps to enter.  Today the patient was seated on arrival to room. Her biggest complaint is the ongoing coughing. She was able to stand with Min guard assistance with standing tolerance of around 2 minutes. Fair standing balance with no external support required. Vitals remained stable with standing activity, Sp02 in the high 90's on 5 L02. She declined walking at this time but has ambulated to the bathroom earlier with staff assistance. Overall activity tolerance limited by fatigue.  She declined the need for home health at this time. Recommend to continue PT while in the hospital to maximize independence and decrease caregiver burden. No PT needs anticipated after this hospital discharge currently.      Recommendations for follow up therapy are one component of a multi-disciplinary discharge planning process, led by the attending physician.  Recommendations may be updated based on patient status, additional functional criteria and insurance authorization.  Follow Up Recommendations       Assistance Recommended at Discharge Set up Supervision/Assistance  Patient can return home with the following  Assistance with cooking/housework;Assist for transportation    Equipment Recommendations None recommended by PT  Recommendations for Other Services       Functional Status Assessment Patient has had a recent decline in their functional status and demonstrates the ability to make  significant improvements in function in a reasonable and predictable amount of time.     Precautions / Restrictions Precautions Precautions: Fall Restrictions Weight Bearing Restrictions: No      Mobility  Bed Mobility               General bed mobility comments: not assessed as patient sitting up on arrival and post session    Transfers Overall transfer level: Needs assistance Equipment used: None Transfers: Sit to/from Stand Sit to Stand: Min guard           General transfer comment: Min guard for safety.    Ambulation/Gait               General Gait Details: patient declined but reports she has walked in the room this morning  Stairs            Wheelchair Mobility    Modified Rankin (Stroke Patients Only)       Balance Overall balance assessment: Needs assistance, History of Falls Sitting-balance support: Feet supported Sitting balance-Leahy Scale: Good     Standing balance support: Single extremity supported Standing balance-Leahy Scale: Fair Standing balance comment: standing tolerance of 2 minutes                             Pertinent Vitals/Pain Pain Assessment Pain Assessment: Faces Faces Pain Scale: Hurts little more Pain Location: L shoulder Pain Descriptors / Indicators:  ("chronic") Pain Intervention(s): Monitored during session    Home Living Family/patient expects to be discharged to:: Private residence Living Arrangements: Other relatives (granddaughter) Available Help at Discharge: Family;Available PRN/intermittently Type of Home:  Apartment Home Access: Level entry       Home Layout: One level Home Equipment: Agricultural consultant (2 wheels);Rollator (4 wheels);Cane - single point;Shower seat Additional Comments: 5L02 at baseline. granddaughter orders groceries and cleans the apartment. she reports she recently finished home health PT    Prior Function Prior Level of Function : Independent/Modified  Independent;History of Falls (last six months);Driving             Mobility Comments: using rollator  or rolling walker for mobility (has trouble getting rollator in and out of the car). 2 falls reported in the past 6 months ADLs Comments: Mod I, uses a shower chair for bathing     Hand Dominance        Extremity/Trunk Assessment   Upper Extremity Assessment Upper Extremity Assessment: Overall WFL for tasks assessed (chronic L shoulder pain reported)    Lower Extremity Assessment Lower Extremity Assessment: Overall WFL for tasks assessed       Communication   Communication: HOH  Cognition Arousal/Alertness: Awake/alert Behavior During Therapy: WFL for tasks assessed/performed Overall Cognitive Status: Within Functional Limits for tasks assessed                                          General Comments General comments (skin integrity, edema, etc.): patient complains of ongoing coughing which is noted during session. vitals remained stable with Sp02 in the high 90's on 5 L 02 while standing    Exercises     Assessment/Plan    PT Assessment Patient needs continued PT services  PT Problem List Decreased strength;Decreased activity tolerance;Decreased balance;Decreased mobility;Cardiopulmonary status limiting activity       PT Treatment Interventions DME instruction;Gait training;Functional mobility training;Therapeutic activities;Therapeutic exercise;Balance training;Neuromuscular re-education;Cognitive remediation;Patient/family education    PT Goals (Current goals can be found in the Care Plan section)  Acute Rehab PT Goals Patient Stated Goal: to go home PT Goal Formulation: With patient Time For Goal Achievement: 02/16/23 Potential to Achieve Goals: Good    Frequency Min 2X/week     Co-evaluation               AM-PAC PT "6 Clicks" Mobility  Outcome Measure Help needed turning from your back to your side while in a flat bed  without using bedrails?: None Help needed moving from lying on your back to sitting on the side of a flat bed without using bedrails?: A Little Help needed moving to and from a bed to a chair (including a wheelchair)?: A Little Help needed standing up from a chair using your arms (e.g., wheelchair or bedside chair)?: A Little Help needed to walk in hospital room?: A Little Help needed climbing 3-5 steps with a railing? : A Little 6 Click Score: 19    End of Session Equipment Utilized During Treatment: Oxygen Activity Tolerance: Patient tolerated treatment well Patient left: in chair;with call bell/phone within reach Nurse Communication: Mobility status PT Visit Diagnosis: Muscle weakness (generalized) (M62.81);Unsteadiness on feet (R26.81)    Time: 1610-9604 PT Time Calculation (min) (ACUTE ONLY): 15 min   Charges:   PT Evaluation $PT Eval Low Complexity: 1 Low          Donna Bernard, PT, MPT   Ina Homes 02/02/2023, 9:06 AM

## 2023-02-02 NOTE — Evaluation (Signed)
Occupational Therapy Evaluation Patient Details Name: [name] MRN: [id number] DOB: 1946-05-22 Today's Date: 02/02/2023   History of Present Illness Patient is a 77 year old female with shortness of breath. History of heart failure, chronic A-fib, pacemaker, AVR & aortic root replacement. On 5L02 at baseline   Clinical Impression   Upon entering the room, pt seated on bench chair in room and reports being Mod I at baseline with use of RW and rollator. Pt's granddaughter lives with her and assists with IADLs for home management tasks. Pt does still drive and is on 5Ls of O2 at baseline. Pt recently discharged from home health therapy services. Pt stands and ambulates in room without physical assistance and transfers on and off of commode in room with supervision. RN staff reports pt has been ambulating in room without assistance as well. Pt mentions she does not want HH OT services again. OT to complete orders at this time as she does not need skilled OT intervention. Pt placed on mobility tech list to encourage mobility/ADLs while here at hospital.      Recommendations for follow up therapy are one component of a multi-disciplinary discharge planning process, led by the attending physician.  Recommendations may be updated based on patient status, additional functional criteria and insurance authorization.   Assistance Recommended at Discharge Intermittent Supervision/Assistance  Patient can return home with the following Assistance with cooking/housework;Assist for transportation    Functional Status Assessment  Patient has not had a recent decline in their functional status  Equipment Recommendations  None recommended by OT       Precautions / Restrictions Precautions Precautions: Fall Restrictions Weight Bearing Restrictions: No      Mobility Bed Mobility               General bed mobility comments: Pt seated at beginning/end of session    Transfers Overall transfer  level: Needs assistance Equipment used: None Transfers: Sit to/from Stand Sit to Stand: Supervision           General transfer comment: Pt ambulates to toilet in room with supervision overall      Balance Overall balance assessment: Needs assistance, History of Falls Sitting-balance support: Feet supported Sitting balance-Leahy Scale: Good     Standing balance support: During functional activity, No upper extremity supported Standing balance-Leahy Scale: Good                             ADL either performed or assessed with clinical judgement   ADL                                         General ADL Comments: supervision overall for ambulation in room and toilet transfer without use of AD     Vision Patient Visual Report: No change from baseline              Pertinent Vitals/Pain Pain Assessment Pain Assessment: Faces Faces Pain Scale: Hurts a little bit Pain Location: chronic L shoulder pain Pain Descriptors / Indicators: Discomfort Pain Intervention(s): Monitored during session     Hand Dominance Right   Extremity/Trunk Assessment Upper Extremity Assessment Upper Extremity Assessment: Overall WFL for tasks assessed;LUE deficits/detail LUE Deficits / Details: chronic pain in shoulder limiting shoulder elevation. Pt reports, " I just do everything with my right arm now".   Lower  Extremity Assessment Lower Extremity Assessment: Overall WFL for tasks assessed       Communication Communication Communication: HOH   Cognition Arousal/Alertness: Awake/alert Behavior During Therapy: WFL for tasks assessed/performed Overall Cognitive Status: Within Functional Limits for tasks assessed                                       General Comments  patient complains of ongoing coughing which is noted during session. vitals remained stable with Sp02 in the high 90's on 5 L 02 while standing            Home Living  Family/patient expects to be discharged to:: Private residence Living Arrangements: Other relatives (72 y/o granddaughter) Available Help at Discharge: Family;Available PRN/intermittently Type of Home: Apartment Home Access: Level entry     Home Layout: One level     Bathroom Shower/Tub: Tub/shower unit         Home Equipment: Agricultural consultant (2 wheels);Rollator (4 wheels);Cane - single point;Shower seat;Hand held shower head   Additional Comments: 5L02 at baseline. granddaughter orders groceries and cleans the apartment. she reports she recently finished home health PT      Prior Functioning/Environment Prior Level of Function : Independent/Modified Independent;History of Falls (last six months);Driving             Mobility Comments: using rollator  or rolling walker for mobility (has trouble getting rollator in and out of the car). 2 falls reported in the past 6 months ADLs Comments: Mod I, uses a shower chair for bathing                 OT Goals(Current goals can be found in the care plan section) Acute Rehab OT Goals Patient Stated Goal: to go home OT Goal Formulation: With patient Time For Goal Achievement: 02/02/23 Potential to Achieve Goals: Fair  OT Frequency:         AM-PAC OT "6 Clicks" Daily Activity     Outcome Measure Help from another person eating meals?: None Help from another person taking care of personal grooming?: None Help from another person toileting, which includes using toliet, bedpan, or urinal?: None Help from another person bathing (including washing, rinsing, drying)?: None Help from another person to put on and taking off regular upper body clothing?: None Help from another person to put on and taking off regular lower body clothing?: None 6 Click Score: 24   End of Session Equipment Utilized During Treatment: Oxygen Nurse Communication: Mobility status  Activity Tolerance: Patient tolerated treatment well Patient left: in  chair;Other (comment) (echo arrives to room)                   Time: 1610-9604 OT Time Calculation (min): 10 min Charges:  OT General Charges $OT Visit: 1 Visit OT Evaluation $OT Eval Low Complexity: 1 Low  Ansleigh Jammer, MS, OTR/L , CBIS ascom [phone]  02/02/23, 10:41 AM

## 2023-02-02 NOTE — ED Notes (Signed)
Called lab to come draw morning labs. °

## 2023-02-02 NOTE — ED Notes (Signed)
Pt refusing to go to double occupancy inpatient room. Lelon Mast, charge nurse made aware.

## 2023-02-02 NOTE — ED Notes (Signed)
Pt refused lab stick for AM labs. Dr Myriam Forehand informed.

## 2023-02-02 NOTE — ED Notes (Signed)
Pt ambulatory to bathroom independently with steady gait. Pt using portable oxygen tank in approved holster.

## 2023-02-02 NOTE — ED Notes (Signed)
Pt refuses IV placement for 8am labs and to move IV from hand. Lab called for AM labs.

## 2023-02-03 ENCOUNTER — Ambulatory Visit: Payer: Medicare Other

## 2023-02-03 DIAGNOSIS — I38 Endocarditis, valve unspecified: Secondary | ICD-10-CM | POA: Diagnosis not present

## 2023-02-03 DIAGNOSIS — I5033 Acute on chronic diastolic (congestive) heart failure: Secondary | ICD-10-CM | POA: Diagnosis not present

## 2023-02-03 LAB — BASIC METABOLIC PANEL
Anion gap: 11 (ref 5–15)
BUN: 22 mg/dL (ref 8–23)
CO2: 28 mmol/L (ref 22–32)
Calcium: 8.9 mg/dL (ref 8.9–10.3)
Chloride: 101 mmol/L (ref 98–111)
Creatinine, Ser: 0.66 mg/dL (ref 0.44–1.00)
GFR, Estimated: >60 mL/min (ref >60)
Glucose, Bld: 91 mg/dL (ref 70–99)
Potassium: 3.3 mmol/L — ABNORMAL LOW (ref 3.5–5.1)
Sodium: 140 mmol/L (ref 135–145)

## 2023-02-03 LAB — MAGNESIUM: Magnesium: 2.4 mg/dL (ref 1.7–2.4)

## 2023-02-03 MED ORDER — POTASSIUM CHLORIDE CRYS ER 20 MEQ PO TBCR
40.0000 meq | EXTENDED_RELEASE_TABLET | Freq: Two times a day (BID) | ORAL | Status: DC
  Administered 2023-02-03 – 2023-02-06 (×7): 40 meq via ORAL
  Filled 2023-02-03 (×7): qty 2

## 2023-02-03 MED ORDER — FLUTICASONE PROPIONATE 50 MCG/ACT NA SUSP
2.0000 | Freq: Every day | NASAL | Status: DC
  Administered 2023-02-03 – 2023-02-06 (×4): 2 via NASAL
  Filled 2023-02-03: qty 16

## 2023-02-03 NOTE — Progress Notes (Signed)
Nutrition Brief Note  RD consulted for assessment of nutritional needs.   Wt Readings from Last 15 Encounters:  02/03/23 83.4 kg  01/15/23 86.1 kg  01/05/23 84.8 kg  12/28/22 83.6 kg  12/08/22 83.9 kg  11/23/22 85.4 kg  10/30/22 85.3 kg  10/26/22 85.6 kg  10/21/22 85.3 kg  08/20/22 85.3 kg  08/18/22 83.9 kg  08/07/22 85.3 kg  07/14/22 85.7 kg  07/13/22 85.5 kg  07/10/22 85.7 kg   Pt with suspecteed upper respiratory infection with cough congestion  chest tightness for the past few days.  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  Reviewed I/o's: +30 ml x 24 hours  Case discussed with RN, who reports pt is making good progress eating eating well. She reports pt still with respiratory distress, requiring O2.   Spoke with pt, who was sitting in recliner chair at time of visit. She complains of feeling poorly secondary to cough and respiratory status. Pt reports she has a good appetite, consumed 100% of breakfast this morning. Pt shares she typically has a good appetite, but ate very little for about 2 days PTA due to feeling poorly. Pt usually consumes 2-3 meals per day. She lives with her granddaughter who "helps me with everything". She admits that they do not cook often and eat a lot of processed foods. Pt is mindful of food labels and sodium content and is very conscious of choosing microwave meals that are below 600 mg or below per serving.   Pt weighs herself daily and UBW ranges between 185-187#. Reviewed wt hx; pt has experienced a 2.2% wt loss over the past 3 months, which is not significant for time frame.   Discussed importance of good meal intake to promote healing. Reviewed CHF self-management with pt, including sodium restriction and daily weights. Pt able to teachback and verbalize information provided. Pt  admits to drinking a lot of water to help with mucous; RD reinforced importance of fluid restriction while in the hospital. Pt is followed by [facility] and finds these visits helpful.   Nutrition-Focused physical exam completed. Findings are no fat depletion, no muscle depletion, and mild edema.    Pt currently on a heart healthy, carb modified diet with 1.5 L fluid restriction. RD eliminated carb modified restriction due to no history of DM.   Medications reviewed and include potassium chloride and bumex.   Lab Results  Component Value Date   HGBA1C 5.7 (H) 07/17/2019    PTA DM medications are none.   Labs reviewed: K: 3.1, CBGS: 102 (inpatient orders for glycemic control are none).    Body mass index is 38.43 kg/m. Patient meets criteria for obesity, class II based on current BMI. Obesity is a complex, chronic medical condition that is optimally managed by a multidisciplinary care team. Weight loss is not an ideal goal for an acute inpatient hospitalization. However, if further work-up for obesity is warranted, consider outpatient referral to [facility].    Current diet order is heart healthy, carb modified diet with 1.5 L fluid restriction (liberalized to 2 gram with 1.5 L fluid restriction), patient is consuming approximately 100% of meals at this time. Labs and medications reviewed.   No nutrition interventions warranted at this time. If nutrition issues arise, please consult RD.   Levada Schilling,  RD, LDN, CDCES Registered Dietitian II Certified Diabetes Care and Education Specialist Please refer to [facility] for RD and/or RD on-call/weekend/after hours pager

## 2023-02-03 NOTE — Progress Notes (Signed)
Mobility Specialist - Progress Note   02/03/23 1249  Mobility  Activity Refused mobility   Pt refuses mobility at this time. Will attempt at another date/time.   Terrilyn Saver  Mobility Specialist  02/03/23 12:50 PM

## 2023-02-03 NOTE — Progress Notes (Signed)
Progress Note    [name]  UEA:[id number] DOB: 05-29-1946  DOA: 02/01/2023 PCP: Lyndon Code, MD      Brief Narrative:    Medical records reviewed and are as summarized below:  [name] is a 77 y.o. female with multiple medical problems including but not limited to aortic stenosis s/p aortic valve replacement with bioprosthetic valve in 2008, s/p [facility] on 11/03/2022 at [facility], ascending aortic aneurysm s/p repair, history of AV node ablation and dual-chamber pacemaker placement in June 2021, paroxysmal atrial fibrillation s/p multiple cardioversions, severe COPD, chronic diastolic CHF, chronic hypoxic respiratory failure on 5 L/min oxygen via [place], stroke, right upper lobe lung cancer, hypertension, hyperlipidemia, anxiety, arthritis, sleep apnea, hearing impairment, chronic anemia.  He presented to the hospital with cough, congestion, shortness of breath, chest tightness and leg swelling.  She was admitted to the hospital for CHF exacerbation.  She was treated with IV bumetanide.    Assessment/Plan:   Principal Problem:   Heart failure due to valvular disease, acute on chronic, diastolic ([name]) Active Problems:   SOB (shortness of breath)   Chronic obstructive pulmonary disease (COPD) ([name])   Atrial fibrillation, chronic ([name])   Essential hypertension   Hypothyroidism   History of aortic valve replacement with bioprosthetic valve   Hypokalemia    Acute exacerbation of chronic diastolic CHF s/p TAVR in February 2024 for aortic stenosis, mild mitral regurgitation, moderate tricuspid regurgitation:  Continue IV bumetanide.   Tessalon Perles as needed for cough  monitor BMP,  daily weight and urine output.   2D echo on 11/04/2022 showed normal LV function, moderate LVH, mild MR, moderate TR   Hypokalemia: Replete potassium and monitor levels.  Increase daily 40 mEq Kcl to BID. Monitor BMP, Mg    COPD and chronic hypoxic respiratory failure:  Continue  bronchodilators.   At baseline, uses 5 L/min oxygen via [place].   Right upper lobe lung cancer, stage I: She's had 3 sessions of radiation therapy.   She said she was due for 2 more this week and that she does not think she can make it.   Outpatient follow-up with oncologist and radiation oncologist.   Permanent atrial fibrillation: Continue Eliquis   Hypertension: Continue antihypertensives.  She said her carvedilol was recently increased from 1 tablet of 6.25 mg to 3 tablets of 6.25 mg.   Consultants: None  Procedures: None   Family Communication/Anticipated D/C date and plan/Code Status   DVT prophylaxis:  apixaban (ELIQUIS) tablet 5 mg     Code Status: Full Code  Family Communication: None Disposition Plan: Plan to discharge home in 2 to 3 days   Status is: Inpatient Remains inpatient appropriate because: CHF exacerbation on IV bumetanide   Subjective:   Pt in recliner this AM.  She cannot hear, deaf in one ear and charging her hearing aid. Is able to read lips and understand questions.  Reports feeling a little better since admission.  Making a lot of urine today.  No other acute complaints.   Objective:    Vitals:   02/03/23 0607 02/03/23 0730 02/03/23 1054 02/03/23 1133  BP:  (!) 160/71  (!) 150/69  Pulse:  70  70  Resp:  18  18  Temp:  (!) 97.3 F (36.3 C)  98.2 F (36.8 C)  TempSrc:      SpO2: 99% 100% 98% 98%  Weight:       No data found.   Intake/Output Summary (  Last 24 hours) at 02/03/2023 1308 Last data filed at 02/03/2023 1046 Gross per 24 hour  Intake 480 ml  Output 450 ml  Net 30 ml   Filed Weights   02/03/23 0606  Weight: 83.4 kg    Exam:  General exam: awake, alert, no acute distress HEENT: nasal cannula in place, moist mucus membranes, very hard of hearing  Respiratory system: CTAB, no wheezes, rales or rhonchi, normal respiratory effort. Cardiovascular system: normal S1/S2, RRR, 1+ BLE edema.   Gastrointestinal system: soft,  NT, ND Central nervous system: A&O x3. no gross focal neurologic deficits, normal speech Extremities: moves all, 1+ BLE edema, normal tone Skin: dry, intact, normal temperature Psychiatry: normal mood, congruent affect, judgement and insight appear normal         Data Reviewed:   Notable labs -- K 3.1 >> 3.3 otherwise normal BMP. Mg 2.4 normal.       LOS: 2 days   Pennie Banter, DO  [facility]   Pager on [email or web address]. If 7PM-7AM, please contact night-coverage at [email or web address]  02/03/2023, 1:08 PM

## 2023-02-03 NOTE — Consult Note (Addendum)
   Heart Failure Nurse Navigator Note  HFpEF 60 to 65%.  Wall motion abnormalities noted.  Ventricular size is mildly enlarged.  She presented from home with complaints of worsening shortness of breath,along with cough, congestion and chest tightness.  BNP 330.  BMI 38.43.  Chest x-ray with mild to moderate interstitial edema.  Comorbidities:  Chronic A-fib COPD Hypertension Hyperlipidemia Hypothyroidism Obesity  Permanent pacemaker insertion Status post TAVR  Medications:  Amlodipine 5 mg daily Apixaban 5 mg 2 times a day Atorvastatin 40 mg daily Bumex 0.5 mg IV every 12 hours Carvedilol 18.75 mg 2 times a day with meals Levothyroxine 25 mcg daily Lisinopril 40 mg daily Potassium chloride 40 mEq 2 times daily  Labs:  Sodium 136, potassium 3.1, chloride 95, CO2 29, BUN 20, creatinine 0.7, magnesium 2.2 Weight is not documented Intake 240 mL Output 300 mL  Initial meeting with patient on this admission.  She was sitting up in the chair at bedside on continuous oxygen per nasal cannula.  She states that her nose remains stuffy and that she has had a panic attack, feeling like she cannot breathe. She was treated with xanax and seems to be helping.  She states at home that she had been weighing herself daily and her weight had varied from 183 up to 187.  She states that she might note her weight is up one day and then will be down the next.  But then she also noted she had ankle swelling, she felt she had not experienced that before and   she proceeded to the emergency room.  She admitted to dietary indiscretions has been eating things like Doritos.  Sometimes that she will comply with the amount of chips that are designated with the sodium intake.  Then other times she states that she will just eaten till she gets full.  She does get [employer] frozen dinners and tries not to eat any of the dinners that are over 500 mg per dinner for sodium allotment.  She states that she  might eat 1 dinner every other day.  Went over fluid restriction.  Patient states that she drinks a lot of water and tea thinking that she needed to flush her system.  Went over the importance of sticking with no more than 64 ounces and the reasoning behind that.  She voices understanding.  Discussed the [facility] program with her, she again states she is not interested as she monitors her own readings.  She has follow up in the HF clinic on June 10 at 11:30 AM.  She had no further questions.  Tresa Endo RN

## 2023-02-04 ENCOUNTER — Ambulatory Visit: Payer: Medicare Other

## 2023-02-04 DIAGNOSIS — I5033 Acute on chronic diastolic (congestive) heart failure: Secondary | ICD-10-CM | POA: Diagnosis not present

## 2023-02-04 DIAGNOSIS — I38 Endocarditis, valve unspecified: Secondary | ICD-10-CM | POA: Diagnosis not present

## 2023-02-04 LAB — BASIC METABOLIC PANEL
Anion gap: 11 (ref 5–15)
BUN: 18 mg/dL (ref 8–23)
CO2: 27 mmol/L (ref 22–32)
Calcium: 8.8 mg/dL — ABNORMAL LOW (ref 8.9–10.3)
Chloride: 98 mmol/L (ref 98–111)
Creatinine, Ser: 0.78 mg/dL (ref 0.44–1.00)
GFR, Estimated: >60 mL/min (ref >60)
Glucose, Bld: 145 mg/dL — ABNORMAL HIGH (ref 70–99)
Potassium: 4 mmol/L (ref 3.5–5.1)
Sodium: 136 mmol/L (ref 135–145)

## 2023-02-04 MED ORDER — BUMETANIDE 0.25 MG/ML IJ SOLN
2.0000 mg | Freq: Two times a day (BID) | INTRAMUSCULAR | Status: DC
  Administered 2023-02-04 – 2023-02-06 (×5): 2 mg via INTRAVENOUS
  Filled 2023-02-04 (×5): qty 8

## 2023-02-04 MED ORDER — PHENYLEPHRINE HCL 0.25 % NA SOLN
1.0000 | Freq: Four times a day (QID) | NASAL | Status: DC | PRN
  Administered 2023-02-04 – 2023-02-06 (×5): 1 via NASAL
  Filled 2023-02-04: qty 15

## 2023-02-04 MED ORDER — LORATADINE 10 MG PO TABS
10.0000 mg | ORAL_TABLET | Freq: Every day | ORAL | Status: DC
  Administered 2023-02-04 – 2023-02-06 (×3): 10 mg via ORAL
  Filled 2023-02-04 (×3): qty 1

## 2023-02-04 NOTE — Progress Notes (Addendum)
Mobility Specialist - Progress Note  During mobility:SpO2(96) Post-mobility: SPO2(92)    02/04/23 1445  Mobility  Activity Ambulated with assistance in hallway (HHA for first 8 ft.)  Level of Assistance Independent (Managed lines)  Distance Ambulated (ft) 140 ft  Range of Motion/Exercises Active  Activity Response Tolerated well  Mobility Referral Yes  $Mobility charge 1 Mobility  Mobility Specialist Start Time (ACUTE ONLY) 1127  Mobility Specialist Stop Time (ACUTE ONLY) 1142  Mobility Specialist Time Calculation (min) (ACUTE ONLY) 15 min   Pt resting in recliner asleep upon arrival aroused to sound on 5L. Pt STS and ambulates to hallway around NS no AD Indep. Pt returned to recliner and left with needs in reach.   Johnathan Hausen Mobility Specialist 02/04/23, 2:58 PM

## 2023-02-04 NOTE — Care Management Important Message (Signed)
Important Message  Patient Details  Name: [name] MRN: [id number] Date of Birth: 05/05/1946   Medicare Important Message Given:  Yes     Johnell Comings 02/04/2023, 10:59 AM

## 2023-02-04 NOTE — Progress Notes (Signed)
Progress Note    TRANIECE Howard  RUE:[id number] DOB: 1945-11-07  DOA: 02/01/2023 PCP: Lyndon Code, MD      Brief Narrative:    Medical records reviewed and are as summarized below:  [name] is a 77 y.o. female with multiple medical problems including but not limited to aortic stenosis s/p aortic valve replacement with bioprosthetic valve in 2008, s/p TAVR on 11/03/2022 at [facility], ascending aortic aneurysm s/p repair, history of AV node ablation and dual-chamber pacemaker placement in June 2021, paroxysmal atrial fibrillation s/p multiple cardioversions, severe COPD, chronic diastolic CHF, chronic hypoxic respiratory failure on 5 L/min oxygen via [place], stroke, right upper lobe lung cancer, hypertension, hyperlipidemia, anxiety, arthritis, sleep apnea, hearing impairment, chronic anemia.  He presented to the hospital with cough, congestion, shortness of breath, chest tightness and leg swelling.  She was admitted to the hospital for CHF decompensation.  Being treated with IV bumetanide.  5/30 -- increase IV Bumex from 0.5 to 2 mg BID.    Assessment/Plan:   Principal Problem:   Heart failure due to valvular disease, acute on chronic, diastolic ([name]) Active Problems:   SOB (shortness of breath)   Chronic obstructive pulmonary disease (COPD) ([name])   Atrial fibrillation, chronic ([name])   Essential hypertension   Hypothyroidism   History of aortic valve replacement with bioprosthetic valve   Hypokalemia    Acute exacerbation of chronic diastolic CHF s/p TAVR in February 2024 for aortic stenosis, mild mitral regurgitation, moderate tricuspid regurgitation:  Continue IV Bumex -- increase dose to 2 mg IV BID (pt takes 2 mg PO Bumex BID at home), might need further dose escalation.   Monitor BMP,  Daily weight strict I/O's.   Echo on 11/04/2022 showed normal LV function, moderate LVH, mild MR, moderate TR  Net IO Since Admission: -620 mL [02/04/23 1242]   Sinus  congestion -- check respiratory viral panel.  Recent exposure to family member with URI. Negative for Covid and flu on admission. --Claritin, Flonase & other supportive care per orders --Tessalon Perles as needed for cough    Hypokalemia: Resolved with replacement, K 4.0. Monitor BMP, Mg    COPD and chronic hypoxic respiratory failure:  Continue bronchodilators.   At baseline, uses 5 L/min oxygen via [place].   Right upper lobe lung cancer, stage I: She's had 3 sessions of radiation therapy.   She said she was due for 2 more this week and that she does not think she can make it.   Outpatient follow-up with oncologist and radiation oncologist.   Permanent atrial fibrillation: Continue Eliquis   Hypertension: Continue antihypertensives.   Pt reported carvedilol was recently increased from 1 tablet of 6.25 mg to 3 tablets of 6.25 mg.   Consultants: None  Procedures: None   Family Communication/Anticipated D/C date and plan/Code Status   DVT prophylaxis:  apixaban (ELIQUIS) tablet 5 mg     Code Status: Full Code  Family Communication: None Disposition Plan: Plan to discharge home in 2 to 3 days   Status is: Inpatient Remains inpatient appropriate because: CHF exacerbation on IV diuresis   Subjective:   Pt in recliner this AM.  She reports severe nasal / sinus congestion.  States her granddaughter had a URI a few weeks ago.  She denies sore throat, reports an intermittent cough.  Does not feel like her swelling is improving much yet.  Abdomen feels distended from fluid, as it did not improve after multiple BM's.  No other acute complaints.   Objective:    Vitals:   02/04/23 0405 02/04/23 0443 02/04/23 0819 02/04/23 1133  BP: (!) 131/102  (!) 169/73 131/67  Pulse: 77  72 74  Resp: 18  18 18   Temp: 98.1 F (36.7 C)  97.6 F (36.4 C) 98.7 F (37.1 C)  TempSrc:      SpO2: 95%  100% 95%  Weight:  84 kg     No data found.   Intake/Output Summary (Last 24 hours)  at 02/04/2023 1235 Last data filed at 02/04/2023 1028 Gross per 24 hour  Intake 600 ml  Output 1250 ml  Net -650 ml   Filed Weights   02/03/23 0606 02/04/23 0443  Weight: 83.4 kg 84 kg    Exam:  General exam: awake, alert, no acute distress HEENT: nasal cannula in place, moist mucus membranes, sounds very congested  Respiratory system: CTAB diminished bases, no wheezes, rales or rhonchi, normal respiratory effort, on 5 L/min [place] O2. Cardiovascular system: normal S1/S2, RRR, 1+ BLE edema appears unchanged.   Gastrointestinal system: soft, NT, mildly distended Central nervous system: A&O x3. no gross focal neurologic deficits, normal speech Extremities: moves all, 1+ BLE edema, normal tone Skin: dry, intact, normal temperature Psychiatry: normal mood, congruent affect, judgement and insight appear normal      Data Reviewed:   Notable labs -- K normalized 3.3 >> 4.0, glucose 145, Ca 8.8 otherwise normal BMP.        LOS: 3 days   Pennie Banter, DO  [facility]   Pager on [email or web address]. If 7PM-7AM, please contact night-coverage at [email or web address]  02/04/2023, 12:35 PM

## 2023-02-04 NOTE — Progress Notes (Addendum)
       CROSS COVER NOTE  NAME: [name] MRN: [id number] DOB : 1946/03/04    Concern from nurse Eyvonne Mechanic   pt here for CHF, COPD and chronic respiratory failure. on 5 Liters O2 (chronic) c/o bad nasal congestion - feels like nose is completely stopped up and can't breathe through her nose. saline nasal spray not helping and flonase not helpful during day. anything else we can't try ? thank you      Pertinent findings on chart review: Last progress not reviewed. Chronic cardiac problems but stable    02/04/2023    4:05 AM 02/04/2023   12:15 AM 02/03/2023    9:26 PM  Vitals with BMI  Systolic 131 150   Diastolic 102 74   Pulse 77 70 74     Assessment and  Interventions   Assessment: persistent nasal congestion  Plan: Trial of phenylephrine nasal spray prn for 1-2 days X X

## 2023-02-05 ENCOUNTER — Ambulatory Visit: Payer: Medicare Other

## 2023-02-05 DIAGNOSIS — I5033 Acute on chronic diastolic (congestive) heart failure: Secondary | ICD-10-CM | POA: Diagnosis not present

## 2023-02-05 DIAGNOSIS — I38 Endocarditis, valve unspecified: Secondary | ICD-10-CM | POA: Diagnosis not present

## 2023-02-05 LAB — BASIC METABOLIC PANEL
Anion gap: 7 (ref 5–15)
BUN: 18 mg/dL (ref 8–23)
CO2: 30 mmol/L (ref 22–32)
Calcium: 8.5 mg/dL — ABNORMAL LOW (ref 8.9–10.3)
Chloride: 101 mmol/L (ref 98–111)
Creatinine, Ser: 0.72 mg/dL (ref 0.44–1.00)
GFR, Estimated: >60 mL/min (ref >60)
Glucose, Bld: 97 mg/dL (ref 70–99)
Potassium: 4.3 mmol/L (ref 3.5–5.1)
Sodium: 138 mmol/L (ref 135–145)

## 2023-02-05 LAB — RESPIRATORY PANEL BY PCR

## 2023-02-05 NOTE — Progress Notes (Addendum)
PT Cancellation Note  Patient Details Name: [name] MRN: [id number] DOB: 11-Jul-1946   Cancelled Treatment:    Reason Eval/Treat Not Completed: Patient declined, no reason specified Patient declines stating she has already walked. Will re-attempt later as time allows.    [name] 02/05/2023, 10:33 AM

## 2023-02-05 NOTE — Progress Notes (Signed)
Mobility Specialist - Progress Note   Pre-mobility:  SpO2(92) Post-mobility: SpO2(96)     02/05/23 1010  Oxygen Therapy  SpO2 92 %  Mobility  Activity Ambulated independently in hallway  Level of Assistance Independent after set-up  Assistive Device None  Distance Ambulated (ft) 280 ft  Range of Motion/Exercises Active  Activity Response Tolerated well  Mobility Referral Yes  $Mobility charge 1 Mobility  Mobility Specialist Start Time (ACUTE ONLY) 0946  Mobility Specialist Stop Time (ACUTE ONLY) 1011  Mobility Specialist Time Calculation (min) (ACUTE ONLY) 25 min   Pt resting in recliner on 5L upon entry. Pt STS and ambulates to hallway around NS (B Pod) for 2 laps no AD Indep after setup (line management). Pt returned to recliner and left with needs in reach.   Johnathan Hausen Mobility Specialist 02/05/23, 10:22 AM

## 2023-02-05 NOTE — Progress Notes (Signed)
Progress Note    [name]  VWU:[id number] DOB: 10/12/1945  DOA: 02/01/2023 PCP: Lyndon Code, MD      Brief Narrative:    Medical records reviewed and are as summarized below:  [name] is a 77 y.o. female with multiple medical problems including but not limited to aortic stenosis s/p aortic valve replacement with bioprosthetic valve in 2008, s/p TAVR on 11/03/2022 at [facility], ascending aortic aneurysm s/p repair, history of AV node ablation and dual-chamber pacemaker placement in June 2021, paroxysmal atrial fibrillation s/p multiple cardioversions, severe COPD, chronic diastolic CHF, chronic hypoxic respiratory failure on 5 L/min oxygen via [place], stroke, right upper lobe lung cancer, hypertension, hyperlipidemia, anxiety, arthritis, sleep apnea, hearing impairment, chronic anemia.  He presented to the hospital with cough, congestion, shortness of breath, chest tightness and leg swelling.  She was admitted to the hospital for CHF decompensation.  Being treated with IV bumetanide.  5/30 -- increase IV Bumex from 0.5 to 2 mg BID.    Assessment/Plan:   Principal Problem:   Heart failure due to valvular disease, acute on chronic, diastolic ([name]) Active Problems:   SOB (shortness of breath)   Chronic obstructive pulmonary disease (COPD) ([name])   Atrial fibrillation, chronic ([name])   Essential hypertension   Hypothyroidism   History of aortic valve replacement with bioprosthetic valve   Hypokalemia    Acute exacerbation of chronic diastolic CHF s/p TAVR in February 2024 for aortic stenosis, mild mitral regurgitation, moderate tricuspid regurgitation:  --Continue IV Bumex -- increased dose to 2 mg IV BID on 5/31 (pt takes 2 mg PO Bumex BID at home), might need further dose escalation.   --Monitor BMP,  --Daily weight strict I/O's.   Echo on 11/04/2022 showed normal LV function, moderate LVH, mild MR, moderate TR  Net IO Since Admission: -1,620 mL [02/05/23  1253]   Metapneumovirus infection / Sinus congestion -- check respiratory viral panel.  Recent exposure to family member with URI.  Negative for Covid and flu on admission. --RVP positive for metapneumovirus on 5/30 --Droplet precautions --Claritin, Flonase & other supportive care per orders --Tessalon Perles as needed for cough    Hypokalemia: Resolved with replacement, K 4.0. Monitor BMP, Mg    COPD and chronic hypoxic respiratory failure:  Continue bronchodilators.   At baseline, uses 5 L/min oxygen via [place].   Right upper lobe lung cancer, stage I: She's had 3 sessions of radiation therapy.   She said she was due for 2 more this week and that she does not think she can make it.   Outpatient follow-up with oncologist and radiation oncologist.   Permanent atrial fibrillation: Continue Eliquis   Hypertension: Continue antihypertensives.   Pt reported carvedilol was recently increased from 1 tablet of 6.25 mg to 3 tablets of 6.25 mg.   Consultants: None  Procedures: None   Family Communication/Anticipated D/C date and plan/Code Status   DVT prophylaxis:  apixaban (ELIQUIS) tablet 5 mg     Code Status: Full Code  Family Communication: None Disposition Plan: Plan to discharge home in 2 to 3 days   Status is: Inpatient Remains inpatient appropriate because: CHF exacerbation on IV diuresis   Subjective:   Pt in recliner this AM.  Reports not feeling any better yet.  Does sound less congested and she agrees, this is slightly better.  Does not feel increased Bumex dose increased urine output very much.  No other acute complaints but wants to feel  better and go home soon.  Objective:    Vitals:   02/05/23 0736 02/05/23 0801 02/05/23 1010 02/05/23 1244  BP: (!) 170/70   (!) 115/52  Pulse: 76   70  Resp: 18   18  Temp: 98.1 F (36.7 C)   (!) 96.9 F (36.1 C)  TempSrc:      SpO2: 100% 98% 92% 99%  Weight:       No data found.   Intake/Output Summary  (Last 24 hours) at 02/05/2023 1253 Last data filed at 02/05/2023 0457 Gross per 24 hour  Intake --  Output 1000 ml  Net -1000 ml   Filed Weights   02/03/23 0606 02/04/23 0443 02/05/23 0457  Weight: 83.4 kg 84 kg 84.3 kg    Exam:  General exam: awake, alert, no acute distress HEENT: nasal cannula in place, moist mucus membranes, sounds less congested today Respiratory system: normal respiratory effort, on 5 L/min [place] O2. Lungs clear Cardiovascular system: normal S1/S2, RRR, improved BLE edema trace Gastrointestinal system: soft, NT, mildly distended Central nervous system: A&O x3. no gross focal neurologic deficits, normal speech Extremities: moves all, trace / improved BLE edema, normal tone Skin: dry, intact, normal temperature Psychiatry: normal mood, congruent affect, judgement and insight appear normal      Data Reviewed:   Notable labs -- Ca 8.5 otherwise normal BMP.        LOS: 4 days   Pennie Banter, DO  [facility]   Pager on [email or web address]. If 7PM-7AM, please contact night-coverage at [email or web address]  02/05/2023, 12:53 PM

## 2023-02-05 NOTE — Consult Note (Signed)
[facility] service manager [employer]) Accountable Care Organization (ACO) [facility] Liaison Note  02/05/2023  [name] [birth year] [id number]  Location: [facility] RN Hospital Liaison screened the patient remotely at [facility].  Insurance: [employer]   [name] is a 77 y.o. female who is a Primary Care Patient of Lyndon Code, MD. The patient was screened for readmission hospitalization with noted high risk score for unplanned readmission risk with 2 IP in 6 months in 6 months.  The patient was assessed for potential [facility]) Care Management service needs for post hospital transition for care coordination. Review of patient's electronic medical record reveals patient was admitted for Acute Heart Failure. Attempted bedside visit spoke with pt concerning [facility] services and available benefits. Pt receptive to post hospital follow up call. Patient was given an appointment reminder card and 24 hour Nurse Advice Line magnet.   Plan: [facility] Liaison will continue to follow progress and disposition to asess for post hospital community care coordination/management needs.  Referral request for community care coordination: anticipate [facility] Transitions of Care Team follow up.   [facility] Care Management/Population Health does not replace or interfere with any arrangements made by the Inpatient Transition of Care team.   For questions contact:   Elliot Cousin, RN, BSN [facility] Liaison [place]   [facility]  Population Health Office Hours MTWF  8:00 am-6:00 pm Off on Thursday [phone] mobile [phone] [Office toll free line][facility] Office Hours are M-F 8:30 - 5 pm 24 hour nurse advise line [phone] [email or web address]  [email or web address]

## 2023-02-05 NOTE — TOC Initial Note (Signed)
Transition of Care [facility]) - Initial/Assessment Note    Patient Details  Name: [name] MRN: [id number] Date of Birth: Jan 12, 1946  Transition of Care [facility]) CM/SW Contact:    Margarito Liner, LCSW Phone Number: 02/05/2023, 11:34 AM  Clinical Narrative:  Readmission prevention screen complete. CSW met with patient. No supports at bedside. CSW introduced role and explained that discharge planning would be discussed. PCP is Beverely Risen, MD. Patient drives herself to appointments. Pharmacy is [facility] on the corner of [address] No issues obtaining medications. Patient's granddaughter lives with her. No home heatlh prior to admission. She has a rollator, RW, cane, and tub seat. Patient is on 5 L chronic oxygen through [employer]. No further concerns. CSW encouraged patient to contact CSW as needed. CSW will continue to follow patient for support and facilitate return home once stable. Her granddaughter or sister will transport her home at discharge.              Expected Discharge Plan: Home/Self Care Barriers to Discharge: Continued Medical Work up   Patient Goals and CMS Choice            Expected Discharge Plan and Services     Post Acute Care Choice: NA Living arrangements for the past 2 months: Apartment                                      Prior Living Arrangements/Services Living arrangements for the past 2 months: Apartment Lives with:: Relatives Patient language and need for interpreter reviewed:: Yes Do you feel safe going back to the place where you live?: Yes      Need for Family Participation in Patient Care: Yes (Comment) Care giver support system in place?: Yes (comment) Current home services: DME Criminal Activity/Legal Involvement Pertinent to Current Situation/Hospitalization: No - Comment as needed  Activities of Daily Living Home Assistive Devices/Equipment: Walker (specify type), Oxygen, Eyeglasses ADL Screening  (condition at time of admission) Patient's cognitive ability adequate to safely complete daily activities?: Yes Is the patient deaf or have difficulty hearing?: No Does the patient have difficulty seeing, even when wearing glasses/contacts?: No Does the patient have difficulty concentrating, remembering, or making decisions?: No Patient able to express need for assistance with ADLs?: Yes Does the patient have difficulty dressing or bathing?: No Independently performs ADLs?: Yes (appropriate for developmental age) Does the patient have difficulty walking or climbing stairs?: No Weakness of Legs: None Weakness of Arms/Hands: None  Permission Sought/Granted                  Emotional Assessment Appearance:: Appears stated age Attitude/Demeanor/Rapport: Engaged, Gracious Affect (typically observed): Accepting, Appropriate, Calm, Pleasant Orientation: : Oriented to Self, Oriented to Place, Oriented to  Time, Oriented to Situation Alcohol / Substance Use: Not Applicable Psych Involvement: No (comment)  Admission diagnosis:  SOB (shortness of breath) [R06.02] Patient Active Problem List   Diagnosis Date Noted   SOB (shortness of breath) 02/01/2023   Hypokalemia 08/04/2022   Chronic upper back pain 04/14/2022   Chronic myofascial pain 04/14/2022   Musculoskeletal disorder involving upper trapezius muscle 04/14/2022   Abnormal CT scan, cervical spine (03/04/2022) 04/01/2022   Trigger point of shoulder region (Bilateral) 04/01/2022   Trigger point with neck pain 04/01/2022   DISH (diffuse idiopathic skeletal hyperostosis) 03/12/2022   Atrial fibrillation, chronic ([name]) 01/26/2022   Morbid obesity ([name])  01/26/2022   Chronic respiratory failure with hypoxia ([name]) 01/26/2022   Bilateral pneumonia 01/25/2022   Chronic intractable headache 01/07/2022   Chronic diastolic CHF (congestive heart failure) ([name]) 12/02/2021   HLD (hyperlipidemia) 12/02/2021   Stroke ([name]) 12/02/2021   Iron  deficiency anemia 12/02/2021   Depression 12/02/2021   Acute on chronic respiratory failure with hypoxia ([name]) 11/11/2021   Unable to maintain body in lying position 10/16/2021   Orthopnea 10/16/2021   Class 2 obesity with alveolar hypoventilation, serious comorbidity, and body mass index (BMI) of 39.0 to 39.9 in adult ([name]) 10/16/2021   At high risk for postoperative complications 10/16/2021   Hearing loss 08/12/2021   Long term prescription benzodiazepine use 04/30/2021   Chronic shoulder pain (1ry area of Pain) (Bilateral) (L>R) 04/30/2021   Chronic upper extremity pain (2ry area of Pain) (Bilateral) (L>R) 04/30/2021   Cervicalgia 04/30/2021   Chronic neck pain (3ry area of Pain) (Posterior) (Bilateral) (L>R) 04/30/2021   Shoulder blade pain (4th area of Pain) (Left) 04/30/2021   Osteoarthritis of glenohumeral joint (Left) 04/30/2021   Osteoarthritis of AC (acromioclavicular) joint (Left) 04/30/2021   Osteoarthritis of glenohumeral joints (Bilateral) 04/30/2021   Osteoarthritis of acromioclavicular joints (Bilateral) 04/30/2021   Primary osteoarthritis of shoulders (Bilateral) 04/30/2021   DDD (degenerative disc disease), cervical 04/30/2021   Cervical radiculitis (Left) 04/30/2021   Cervical radiculopathy (Left) 04/30/2021   C6 radiculopathy (Left) 04/30/2021   C7 radiculopathy (Left) 04/30/2021   Wheelchair dependence 04/30/2021   Chronic pain syndrome 04/29/2021   Pharmacologic therapy 04/29/2021   Disorder of skeletal system 04/29/2021   Problems influencing health status 04/29/2021   Chronic anticoagulation (Eliquis) 03/14/2021   Hx of atrioventricular node ablation 03/14/2021   Symptomatic anemia 01/29/2021   Heart failure due to valvular disease, acute on chronic, diastolic ([name]) 05/11/2020   Chronic obstructive pulmonary disease (COPD) ([name])    Hypothyroidism    Cardiac pacemaker in situ 05/10/2020   Acute non-recurrent frontal sinusitis 04/22/2020   Vertigo 04/22/2020    S/P placement of cardiac pacemaker 02/21/2020   Atrial fibrillation status post cardioversion ([name]) 11/16/2019   Episode of moderate major depression ([name]) 10/10/2019   Persistent atrial fibrillation ([name])    Encounter for general adult medical examination with abnormal findings 07/10/2019   Encounter for screening mammogram for malignant neoplasm of breast 07/10/2019   Atopic dermatitis 05/02/2019   Paroxysmal atrial flutter ([name]) 08/11/2018   Encounter for long-term (current) use of medications 07/09/2018   Chronic left shoulder pain 07/09/2018   Conjunctivitis 07/09/2018   Oxygen dependent 07/09/2018   GAD (generalized anxiety disorder) 07/09/2018   Ovarian failure 07/09/2018   Positive colorectal cancer screening using Cologuard test 04/24/2018   Calculus of gallbladder with acute on chronic cholecystitis 04/08/2018   H/O: CVA (cerebrovascular accident) 04/08/2018   Dysuria 03/02/2018   Right upper quadrant abdominal tenderness without rebound tenderness 03/02/2018   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 exacerbation 03/02/2018   Chronic obstructive pulmonary disease ([name]) 09/19/2017   Typical atrial flutter ([name])    Irritable bowel syndrome without diarrhea 01/29/2015   Essential hypertension 01/24/2015   Paroxysmal atrial fibrillation ([name]) 01/24/2015   History of aortic valve replacement with bioprosthetic valve 01/24/2015   Atrial fibrillation with RVR ([name]) 01/11/2015   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 05/18/2014   Shoulder impingement syndrome (Left) 05/18/2014   Cellulitis and abscess 02/13/2014   MRSA (methicillin resistant Staphylococcus aureus) 02/13/2014   Aneurysm, ascending aorta ([name]) 06/23/2013   Bicuspid aortic valve 06/23/2013  PCP:  Lyndon Code, MD Pharmacy:   [id number] [place], [place] - [address] AT [facility] OF [address] [place] [address] Phone: [phone]  Fax: [phone]  [facility] - [place], [place] - [address] [place] [address] Phone: [phone] Fax: [phone]  [facility] - [place], [place] - [address] [facility] [address] [employer]. [address] [place] [address] Phone: [phone] Fax: [phone]     Social Determinants of Health (SDOH) Social History: SDOH Screenings   Food Insecurity: No Food Insecurity (02/02/2023)  Housing: Low Risk  (02/02/2023)  Transportation Needs: No Transportation Needs (02/02/2023)  Utilities: Not At Risk (02/02/2023)  Alcohol Screen: Low Risk  (03/17/2022)  Depression (PHQ2-9): Low Risk  (12/08/2022)  Financial Resource Strain: Low Risk  (08/04/2021)  Tobacco Use: Medium Risk (02/02/2023)   SDOH Interventions:     Readmission Risk Interventions    02/05/2023   11:31 AM  Readmission Risk Prevention Plan  Transportation Screening Complete  PCP or Specialist Appt within 5-7 Days Complete  Medication Review (RN CM) Complete

## 2023-02-05 NOTE — Plan of Care (Signed)

## 2023-02-05 NOTE — Progress Notes (Signed)
Physical Therapy Treatment Patient Details Name: [name] MRN: [id number] DOB: 1946/02/08 Today's Date: 02/05/2023   History of Present Illness Patient is a 77 year old female with shortness of breath. History of heart failure, chronic A-fib, pacemaker, AVR & aortic root replacement. On 5L02 at baseline    PT Comments    Patient was standing with nursing after using the bathroom. Patient stated they felt good and that they were ready to walk with [place]. SPT assisted in reattaching displaced electrode after using the bathroom. Ambulated 200 feet with min guard around the nursing station with 5L O2 via nasal canula. Patient left in chair with call bell and phone.    Recommendations for follow up therapy are one component of a multi-disciplinary discharge planning process, led by the attending physician.  Recommendations may be updated based on patient status, additional functional criteria and insurance authorization.  Follow Up Recommendations       Assistance Recommended at Discharge Set up Supervision/Assistance  Patient can return home with the following Assistance with cooking/housework;Assist for transportation   Equipment Recommendations  None recommended by PT    Recommendations for Other Services       Precautions / Restrictions Precautions Precautions: Fall Restrictions Weight Bearing Restrictions: No     Mobility  Bed Mobility               General bed mobility comments: Pt seated in recliner at begining and end of session.    Transfers   Equipment used: None               General transfer comment: Sit to stand not seen as pt was already standing with nursing when we entered the room. Min guard used for stand to sit at end of session.    Ambulation/Gait Ambulation/Gait assistance: Min guard Gait Distance (Feet): 200 Feet Assistive device: None Gait Pattern/deviations: Step-through pattern, Decreased step length - right, Decreased step length  - left, Decreased stance time - right, Decreased stance time - left, Narrow base of support, Shuffle           Stairs             Wheelchair Mobility    Modified Rankin (Stroke Patients Only)       Balance Overall balance assessment: History of Falls, Needs assistance     Sitting balance - Comments: Not tested outside of the recliner.   Standing balance support: During functional activity, No upper extremity supported Standing balance-Leahy Scale: Good                              Cognition Arousal/Alertness: Awake/alert Behavior During Therapy: WFL for tasks assessed/performed Overall Cognitive Status: Within Functional Limits for tasks assessed                                          Exercises      General Comments        Pertinent Vitals/Pain Pain Assessment Pain Assessment: No/denies pain    Home Living                          Prior Function            PT Goals (current goals can now be found in the care plan section) Acute Rehab PT  Goals Patient Stated Goal: to go home PT Goal Formulation: With patient Time For Goal Achievement: 02/16/23 Potential to Achieve Goals: Good Progress towards PT goals: Progressing toward goals    Frequency    Min 2X/week      PT Plan Current plan remains appropriate    Co-evaluation              AM-PAC PT "6 Clicks" Mobility   Outcome Measure  Help needed turning from your back to your side while in a flat bed without using bedrails?: None Help needed moving from lying on your back to sitting on the side of a flat bed without using bedrails?: A Little Help needed moving to and from a bed to a chair (including a wheelchair)?: None Help needed standing up from a chair using your arms (e.g., wheelchair or bedside chair)?: A Little Help needed to walk in hospital room?: None Help needed climbing 3-5 steps with a railing? : A Little 6 Click Score: 21     End of Session Equipment Utilized During Treatment: Oxygen Activity Tolerance: Patient tolerated treatment well Patient left: in chair;with call bell/phone within reach Nurse Communication: Mobility status PT Visit Diagnosis: Muscle weakness (generalized) (M62.81);Unsteadiness on feet (R26.81)     Time: 1610-9604 PT Time Calculation (min) (ACUTE ONLY): 16 min  Charges:                        Malachi Carl, SPT   Malachi Carl 02/05/2023, 3:10 PM

## 2023-02-06 DIAGNOSIS — I5033 Acute on chronic diastolic (congestive) heart failure: Secondary | ICD-10-CM | POA: Diagnosis not present

## 2023-02-06 DIAGNOSIS — I38 Endocarditis, valve unspecified: Secondary | ICD-10-CM | POA: Diagnosis not present

## 2023-02-06 LAB — BASIC METABOLIC PANEL
Anion gap: 9 (ref 5–15)
BUN: 17 mg/dL (ref 8–23)
CO2: 31 mmol/L (ref 22–32)
Calcium: 8.8 mg/dL — ABNORMAL LOW (ref 8.9–10.3)
Chloride: 98 mmol/L (ref 98–111)
Creatinine, Ser: 0.68 mg/dL (ref 0.44–1.00)
GFR, Estimated: >60 mL/min (ref >60)
Glucose, Bld: 103 mg/dL — ABNORMAL HIGH (ref 70–99)
Potassium: 4.1 mmol/L (ref 3.5–5.1)
Sodium: 138 mmol/L (ref 135–145)

## 2023-02-06 MED ORDER — FLUTICASONE PROPIONATE 50 MCG/ACT NA SUSP: 2.0000 | Freq: Every day | NASAL | 2 refills | Status: DC

## 2023-02-06 MED ORDER — GUAIFENESIN-DM 100-10 MG/5ML PO SYRP: 5.0000 mL | ORAL_SOLUTION | ORAL | 0 refills | Status: DC | PRN

## 2023-02-06 NOTE — Discharge Summary (Signed)
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 *  Hospital Course:   [name] is a 77 y.o. female with multiple medical problems including but not limited to aortic stenosis s/p aortic valve replacement with bioprosthetic valve in 2008, s/p TAVR on 11/03/2022 at [facility], ascending aortic aneurysm s/p repair, history of AV node ablation and dual-chamber pacemaker placement in June 2021, paroxysmal atrial fibrillation s/p multiple cardioversions, severe COPD, chronic diastolic CHF, chronic hypoxic respiratory failure on 5 L/min oxygen via [place], stroke, right upper lobe lung cancer, hypertension, hyperlipidemia, anxiety, arthritis, sleep apnea, hearing impairment, chronic anemia.  He presented to the hospital with cough, congestion, shortness of breath, chest tightness and leg swelling.   She was admitted to the hospital for CHF decompensation.  Being treated with IV bumetanide.   5/30 -- increase IV Bumex from 0.5 to 2 mg BID. 5/31 -- edema improving, URI symptoms persistent 6/1 -- LE edema resolved,  URI symptoms improved.  Pt is medically stable and agreeable to discharge home  today.   Assessment and Plan:  Acute exacerbation of chronic diastolic CHF s/p TAVR in February 2024 for aortic stenosis, mild mitral regurgitation, moderate tricuspid regurgitation:  --Diuresed with IV Bumex -- edema resolved. Net IO Since Admission: -3,700 mL [02/06/23 1513]] --Resume home dose 2 mg PO Bumex BID at d/c --Close outpatient follow up with Cardiology and/or PCP   --Monitor BMP,  --Daily weights at home  Echo on 11/04/2022 showed normal LV function, moderate LVH, mild MR, moderate TR       Metapneumovirus infection / Sinus congestion -- check respiratory viral panel.  Recent exposure to family member with URI.  Negative for Covid and flu on admission. --RVP positive for metapneumovirus on 5/30 --Droplet precautions --Claritin, Flonase & other supportive care per orders --Tessalon Perles as needed for cough      Hypokalemia: Resolved with replacement, K 4.0. Monitor BMP, Mg      COPD and chronic hypoxic respiratory failure:  Continue bronchodilators.   At baseline, uses 5 L/min oxygen via [place].     Right upper lobe lung cancer, stage I: She's had 3 sessions of radiation therapy.   She said she was due for 2 more this week and that she does not think she can make it.   Outpatient follow-up with oncologist and radiation oncologist.     Permanent atrial fibrillation: Continue Eliquis     Hypertension: Continue antihypertensives.   Pt reported carvedilol was recently increased from 1 tablet of 6.25 mg to 3 tablets of 6.25 mg.     Consultants: None   Procedures: None        Disposition: Home  Diet recommendation:  Discharge Diet Orders (From admission, onward)     Start     Ordered   02/06/23 0000  Diet - low sodium heart healthy        02/06/23 1048            DISCHARGE MEDICATION: Allergies as of 02/06/2023       Reactions   Benadryl [diphenhydramine Hcl (sleep)] Palpitations   Cetirizine Palpitations   Lasix [furosemide] Rash    Levaquin [levofloxacin In D5w] Other (See Comments)   Reaction:  Fatigue and muscle soreness   Meloxicam Rash   Sulfa Antibiotics Rash        Medication List     STOP taking these medications    benzonatate 200 MG capsule Commonly known as: TESSALON   ondansetron 4 MG disintegrating tablet Commonly known as: ZOFRAN-ODT   predniSONE 10 MG (21) Tbpk tablet Commonly known as: STERAPRED UNI-PAK 21 TAB       TAKE these medications    ALPRAZolam 0.25 MG tablet Commonly known as: XANAX Take 1 tablet (0.25 mg total) by mouth 2 (two) times daily as needed for anxiety.   amLODipine 5 MG tablet Commonly known as: NORVASC Take 5 mg by mouth in the morning and at bedtime.   atorvastatin 40 MG tablet Commonly known as: LIPITOR TAKE 1 TABLET BY MOUTH EVERY NIGHT AT BEDTIME What changed: additional instructions   Breztri Aerosphere 160-9-4.8 MCG/ACT Aero Generic drug: Budeson-Glycopyrrol-Formoterol Inhale 2 puffs into the lungs 2 (two) times daily.   bumetanide 1 MG tablet Commonly known as: BUMEX Take 2 tablets (2 mg total) by mouth 2 (two) times daily. And additional 2 tablets PM PRN   CALCIUM 600+D PO Take 1 tablet by mouth daily.   carvedilol 25 MG tablet Commonly known as: COREG Take 25 mg by mouth 2 (two) times daily with a meal. What changed: Another medication with the same name was removed. Continue taking this medication, and follow the directions you see here.   CoQ10 200 MG Caps Take 200 mg by mouth daily.   diclofenac Sodium 1 % Gel Commonly known as: VOLTAREN Apply 2 g topically 4 (four) times daily.   Eliquis 5 MG Tabs tablet Generic drug: apixaban TAKE 1 TABLET BY MOUTH TWICE DAILY   ferrous sulfate 325 (65 FE) MG tablet Take 1 tablet (325 mg total) by mouth daily with breakfast.   fluticasone 50 MCG/ACT nasal spray Commonly known as: FLONASE Place 2 sprays into both nostrils daily. Start taking on: February 07, 2023   guaiFENesin-dextromethorphan  100-10 MG/5ML syrup Commonly known as: ROBITUSSIN DM Take 5 mLs by mouth every 4 (four) hours as needed for cough.   ipratropium-albuterol 0.5-2.5 (3) MG/3ML Soln Commonly known as: DUONEB Inhale 3 mLs into the lungs every 4 (four) hours as needed. Inhale 3 mls into the lungs every 4 to 6 hours and as needed   Jardiance 10 MG Tabs tablet Generic drug: empagliflozin TAKE 1 TABLET(10 MG) BY MOUTH DAILY BEFORE BREAKFAST What changed: See the new instructions.   levothyroxine 25 MCG tablet Commonly known as: SYNTHROID TAKE 1 TABLET BY MOUTH DAILY BEFORE BREAKFAST   lisinopril 40 MG tablet Commonly known as: ZESTRIL TAKE 1 TABLET(40 MG) BY MOUTH DAILY What changed: See the new instructions.   loratadine 10 MG tablet Commonly known as: CLARITIN Take 10 mg by mouth daily.   methocarbamol 500 MG tablet Commonly known as: ROBAXIN Take 1 tablet (500 mg total) by mouth every 6 (six) hours as needed for  muscle spasms.   oxyCODONE-acetaminophen 7.5-325 MG tablet Commonly known as: Percocet Take one tab po bid for pain severe What changed:  how much to take how to take this when to take this reasons to take this   pantoprazole 40 MG tablet Commonly known as: PROTONIX TAKE 1 TABLET BY MOUTH EVERY DAY   potassium chloride 10 MEQ tablet Commonly known as: KLOR-CON TAKE 4 TABLETS BY MOUTH TWICE DAILY   revefenacin 175 MCG/3ML nebulizer solution Commonly known as: YUPELRI Take 3 mLs (175 mcg total) by nebulization daily.   sertraline 100 MG tablet Commonly known as: ZOLOFT TAKE 1 TABLET BY MOUTH ONCE A DAY FOR ANXIETY What changed:  how much to take how to take this when to take this   sodium chloride 0.65 % Soln nasal spray Commonly known as: OCEAN Place 1 spray into both nostrils as needed for congestion.   traZODone 50 MG tablet Commonly known as: DESYREL TAKE 1 TABLET BY MOUTH EVERY DAY AT BEDTIME   Ventolin HFA 108 (90 Base) MCG/ACT inhaler Generic drug:  albuterol INHALE 2 PUFFS INTO THE LUNGS EVERY 6 HOURS AS NEEDED FOR WHEEZING OR SHORTNESS OF BREATH        Follow-up Information     Lyndon Code, MD Follow up.   Specialties: Internal Medicine, Cardiology Contact information: [address] [place] [address] [phone]                Discharge Exam: Filed Weights   02/04/23 0443 02/05/23 0457 02/06/23 0344  Weight: 84 kg 84.3 kg 84 kg   General exam: awake, alert, no acute distress HEENT: atraumatic, clear conjunctiva, anicteric sclera, moist mucus membranes, hearing grossly normal  Respiratory system: CTAB, no wheezes, rales or rhonchi, normal respiratory effort. On baseline 5 L/min [place] o2 Cardiovascular system: normal S1/S2, RRR, no JVD, murmurs, rubs, gallops, no pedal edema.   Gastrointestinal system: soft, NT, ND, no HSM felt, +bowel sounds. Central nervous system: A&O x4. no gross focal neurologic deficits, normal speech Extremities: moves all, no edema, normal tone Skin: dry, intact, normal temperature, normal color, No rashes, lesions or ulcers Psychiatry: normal mood, congruent affect, judgement and insight appear normal\  Condition at discharge: stable  The results of significant diagnostics from this hospitalization (including imaging, microbiology, ancillary and laboratory) are listed below for reference.   Imaging Studies: DG Chest Portable 1 View  Result Date: 02/01/2023 CLINICAL DATA:  Cough, wheezing and dyspnea. EXAM: PORTABLE CHEST 1 VIEW COMPARISON:  Jan 15, 2023 FINDINGS: There is stable dual lead AICD positioning. Multiple sternal wires are noted. The cardiac silhouette is mildly enlarged and unchanged in size. An artificial aortic valve is in place. Diffuse, mild to moderate severity increased interstitial lung markings are seen with mild perihilar prominence of the pulmonary vasculature. Mild atelectatic changes are seen within the bilateral lung bases. No pleural effusion or pneumothorax is  identified. Multilevel degenerative changes seen throughout the thoracic spine. IMPRESSION: 1. Evidence of prior median sternotomy with artificial aortic valve placement. 2. Findings consistent with mild to moderate severity interstitial edema. Electronically Signed   By: Aram Candela M.D.   On: 02/01/2023 21:20   DG Ribs Unilateral Right  Result Date: 01/20/2023 CLINICAL DATA:  Right chest pain with point tenderness. Larey Seat two months ago. The patient also has had deep coughing. EXAM: RIGHT RIBS - 2 VIEW COMPARISON:  08/03/2022 and chest CT dated 06/02/2022 FINDINGS: Two views of the right ribs demonstrate no visible fracture or pneumothorax. Right axillary  surgical clips. Moderate right glenohumeral degenerative changes. Median sternotomy wires and dual lead pacemaker. Aortic valve stent. IMPRESSION: No acute abnormality.  No rib fractures visible on these two views. Electronically Signed   By: Beckie Salts M.D.   On: 01/20/2023 15:47    Microbiology: Results for orders placed or performed during the hospital encounter of 02/01/23  Resp Panel by RT-PCR (Flu A&B, Covid) Anterior Nasal Swab     Status: None   Collection Time: 02/01/23  9:03 PM   Specimen: Anterior Nasal Swab  Result Value Ref Range Status   SARS Coronavirus 2 by RT PCR NEGATIVE NEGATIVE Final    Comment: (NOTE) SARS-CoV-2 target nucleic acids are NOT DETECTED.  The SARS-CoV-2 RNA is generally detectable in upper respiratory specimens during the acute phase of infection. The lowest concentration of SARS-CoV-2 viral copies this assay can detect is 138 copies/mL. A negative result does not preclude SARS-Cov-2 infection and should not be used as the sole basis for treatment or other patient management decisions. A negative result may occur with  improper specimen collection/handling, submission of specimen other than nasopharyngeal swab, presence of viral mutation(s) within the areas targeted by this assay, and inadequate  number of viral copies(<138 copies/mL). A negative result must be combined with clinical observations, patient history, and epidemiological information. The expected result is Negative.  Fact Sheet for Patients:  BloggerCourse.com  Fact Sheet for Healthcare Providers:  SeriousBroker.it  This test is no t yet approved or cleared by the [place] FDA and  has been authorized for detection and/or diagnosis of SARS-CoV-2 by FDA under an Emergency Use Authorization (EUA). This EUA will remain  in effect (meaning this test can be used) for the duration of the COVID-19 declaration under Section 564(b)(1) of the Act, 21 U.S.C.section 360bbb-3(b)(1), unless the authorization is terminated  or revoked sooner.       Influenza A by PCR NEGATIVE NEGATIVE Final   Influenza B by PCR NEGATIVE NEGATIVE Final    Comment: (NOTE) The Xpert Xpress SARS-CoV-2/FLU/RSV plus assay is intended as an aid in the diagnosis of influenza from Nasopharyngeal swab specimens and should not be used as a sole basis for treatment. Nasal washings and aspirates are unacceptable for Xpert Xpress SARS-CoV-2/FLU/RSV testing.  Fact Sheet for Patients: BloggerCourse.com  Fact Sheet for Healthcare Providers: SeriousBroker.it  This test is not yet approved or cleared by the [place] FDA and has been authorized for detection and/or diagnosis of SARS-CoV-2 by FDA under an Emergency Use Authorization (EUA). This EUA will remain in effect (meaning this test can be used) for the duration of the COVID-19 declaration under Section 564(b)(1) of the Act, 21 U.S.C. section 360bbb-3(b)(1), unless the authorization is terminated or revoked.  Performed at [facility], [address] [place], [place] [address]   Respiratory (~20 pathogens) panel by PCR     Status: Abnormal   Collection Time: 02/04/23  2:30 PM    Specimen: Nasopharyngeal Swab; Respiratory  Result Value Ref Range Status   Adenovirus NOT DETECTED NOT DETECTED Final   Coronavirus 229E NOT DETECTED NOT DETECTED Final    Comment: (NOTE) The Coronavirus on the Respiratory Panel, DOES NOT test for the novel  Coronavirus (2019 nCoV)    Coronavirus HKU1 NOT DETECTED NOT DETECTED Final   Coronavirus NL63 NOT DETECTED NOT DETECTED Final   Coronavirus OC43 NOT DETECTED NOT DETECTED Final   Metapneumovirus DETECTED (A) NOT DETECTED Final   Rhinovirus / Enterovirus NOT DETECTED NOT DETECTED Final  Influenza A NOT DETECTED NOT DETECTED Final   Influenza B NOT DETECTED NOT DETECTED Final   Parainfluenza Virus 1 NOT DETECTED NOT DETECTED Final   Parainfluenza Virus 2 NOT DETECTED NOT DETECTED Final   Parainfluenza Virus 3 NOT DETECTED NOT DETECTED Final   Parainfluenza Virus 4 NOT DETECTED NOT DETECTED Final   Respiratory Syncytial Virus NOT DETECTED NOT DETECTED Final   Bordetella pertussis NOT DETECTED NOT DETECTED Final   Bordetella Parapertussis NOT DETECTED NOT DETECTED Final   Chlamydophila pneumoniae NOT DETECTED NOT DETECTED Final   Mycoplasma pneumoniae NOT DETECTED NOT DETECTED Final    Comment: Performed at [facility], [address] [place], [place] [address]    Labs: CBC: Recent Labs  Lab 02/01/23 2048  WBC 5.4  HGB 11.2*  HCT 36.3  MCV 90.8  PLT 235   Basic Metabolic Panel: Recent Labs  Lab 02/02/23 1153 02/03/23 0841 02/04/23 0935 02/05/23 0404 02/06/23 0421  NA 136 140 136 138 138  K 3.1* 3.3* 4.0 4.3 4.1  CL 95* 101 98 101 98  CO2 29 28 27 30 31   GLUCOSE 130* 91 145* 97 103*  BUN 20 22 18 18 17   CREATININE 0.70 0.66 0.78 0.72 0.68  CALCIUM 9.1 8.9 8.8* 8.5* 8.8*  MG 2.2 2.4  --   --   --    Liver Function Tests: Recent Labs  Lab 02/01/23 2240  AST 20  ALT 17  ALKPHOS 80  BILITOT 0.6  PROT 6.9  ALBUMIN 3.7   CBG: No results for input(s): "GLUCAP" in the last 168  hours.  Discharge time spent: less than 30 minutes.  Signed: Pennie Banter, DO [facility] 02/06/2023

## 2023-02-08 ENCOUNTER — Ambulatory Visit: Payer: Medicare Other

## 2023-02-09 ENCOUNTER — Telehealth: Payer: Self-pay | Admitting: Internal Medicine

## 2023-02-09 NOTE — Telephone Encounter (Signed)
Patient will follow up with congestive heart failure clinic for hospital f/u-Toni

## 2023-02-10 ENCOUNTER — Ambulatory Visit: Payer: Medicare Other

## 2023-02-11 ENCOUNTER — Ambulatory Visit (INDEPENDENT_AMBULATORY_CARE_PROVIDER_SITE_OTHER): Payer: Medicare Other | Admitting: Physician Assistant

## 2023-02-11 ENCOUNTER — Ambulatory Visit: Payer: Medicare Other | Admitting: Dermatology

## 2023-02-11 ENCOUNTER — Encounter: Payer: Self-pay | Admitting: Physician Assistant

## 2023-02-11 VITALS — BP 130/80 | HR 77 | Temp 99.0°F | Resp 16 | Ht <= 58 in | Wt 204.6 lb

## 2023-02-11 DIAGNOSIS — J9611 Chronic respiratory failure with hypoxia: Secondary | ICD-10-CM

## 2023-02-11 DIAGNOSIS — I5032 Chronic diastolic (congestive) heart failure: Secondary | ICD-10-CM | POA: Diagnosis not present

## 2023-02-11 DIAGNOSIS — Z09 Encounter for follow-up examination after completed treatment for conditions other than malignant neoplasm: Secondary | ICD-10-CM

## 2023-02-11 DIAGNOSIS — J069 Acute upper respiratory infection, unspecified: Secondary | ICD-10-CM

## 2023-02-11 MED ORDER — AZITHROMYCIN 250 MG PO TABS
ORAL_TABLET | ORAL | 0 refills | Status: DC
Start: 2023-02-11 — End: 2023-03-08

## 2023-02-11 NOTE — Progress Notes (Signed)
[facility] [address] [place], [place] [address]  Internal MEDICINE  Office Visit Note  Patient Name: [name]  [id number]  [id number]  Date of Service: 02/17/2023     Chief Complaint  Patient presents with   Hospitalization Follow-up   Congestive Heart Failure     HPI Pt is here for recent hospital follow up. -Patient admitted from ED from 02/01/23-02/06/23 for CHF exacerbation treated with IV bumex -Also found to have meta pneumovirus and treated with supportive care and potassium was replaced -It is recommended to have repeat bmp, mg, and cbc in 1-2 weeks and will order these today -Coughing repeatedly, had viral infection for the last 3 weeks, but now is worse. Will go ahead and treat for possible secondary infection -Very tired -Continues to use her oxygen at 5L continuously -has had a gap in her lung cancer treatments -Sees [facility], but unsure when next visit is -will be seeing heart failure clinic in a few weeks  Current Medication: Outpatient Encounter Medications as of 02/11/2023  Medication Sig   ALPRAZolam (XANAX) 0.25 MG tablet Take 1 tablet (0.25 mg total) by mouth 2 (two) times daily as needed for anxiety.   amLODipine (NORVASC) 5 MG tablet Take 5 mg by mouth in the morning and at bedtime.   atorvastatin (LIPITOR) 40 MG tablet TAKE 1 TABLET BY MOUTH EVERY NIGHT AT BEDTIME (Patient taking differently: Take 40 mg by mouth at bedtime. TAKE 1 TABLET BY MOUTH EVERY NIGHT AT BEDTIME)   azithromycin (ZITHROMAX) 250 MG tablet Take one tab a day for 10 days for uri   bumetanide (BUMEX) 1 MG tablet Take 2 tablets (2 mg total) by mouth 2 (two) times daily. And additional 2 tablets PM PRN   Calcium Carbonate-Vitamin D (CALCIUM 600+D PO) Take 1 tablet by mouth daily.   carvedilol (COREG) 25 MG tablet Take 25 mg by mouth 2 (two) times daily with a meal.   Coenzyme Q10 (COQ10) 200 MG CAPS Take 200 mg by mouth daily.   diclofenac Sodium (VOLTAREN) 1 %  GEL Apply 2 g topically 4 (four) times daily.   ELIQUIS 5 MG TABS tablet TAKE 1 TABLET BY MOUTH TWICE DAILY   ferrous sulfate 325 (65 FE) MG tablet Take 1 tablet (325 mg total) by mouth daily with breakfast.   fluticasone (FLONASE) 50 MCG/ACT nasal spray Place 2 sprays into both nostrils daily.   guaiFENesin-dextromethorphan (ROBITUSSIN DM) 100-10 MG/5ML syrup Take 5 mLs by mouth every 4 (four) hours as needed for cough.   ipratropium-albuterol (DUONEB) 0.5-2.5 (3) MG/3ML SOLN Inhale 3 mLs into the lungs every 4 (four) hours as needed. Inhale 3 mls into the lungs every 4 to 6 hours and as needed   JARDIANCE 10 MG TABS tablet TAKE 1 TABLET(10 MG) BY MOUTH DAILY BEFORE BREAKFAST (Patient taking differently: Take 10 mg by mouth daily.)   levothyroxine (SYNTHROID) 25 MCG tablet TAKE 1 TABLET BY MOUTH DAILY BEFORE BREAKFAST   lisinopril (ZESTRIL) 40 MG tablet TAKE 1 TABLET(40 MG) BY MOUTH DAILY (Patient taking differently: Take 40 mg by mouth daily.)   loratadine (CLARITIN) 10 MG tablet Take 10 mg by mouth daily.   methocarbamol (ROBAXIN) 500 MG tablet Take 1 tablet (500 mg total) by mouth every 6 (six) hours as needed for muscle spasms.   oxyCODONE-acetaminophen (PERCOCET) 7.5-325 MG tablet Take one tab po bid for pain severe (Patient taking differently: Take 1 tablet by mouth 2 (two) times daily as needed for severe  pain. Take one tab po bid for pain severe)   pantoprazole (PROTONIX) 40 MG tablet TAKE 1 TABLET BY MOUTH EVERY DAY   potassium chloride (KLOR-CON) 10 MEQ tablet TAKE 4 TABLETS BY MOUTH TWICE DAILY   revefenacin (YUPELRI) 175 MCG/3ML nebulizer solution Take 3 mLs (175 mcg total) by nebulization daily.   sertraline (ZOLOFT) 100 MG tablet TAKE 1 TABLET BY MOUTH ONCE A DAY FOR ANXIETY (Patient taking differently: Take 100 mg by mouth daily. TAKE 1 TABLET BY MOUTH ONCE A DAY FOR ANXIETY)   sodium chloride (OCEAN) 0.65 % SOLN nasal spray Place 1 spray into both nostrils as needed for congestion.    traZODone (DESYREL) 50 MG tablet TAKE 1 TABLET BY MOUTH EVERY DAY AT BEDTIME   VENTOLIN HFA 108 (90 Base) MCG/ACT inhaler INHALE 2 PUFFS INTO THE LUNGS EVERY 6 HOURS AS NEEDED FOR WHEEZING OR SHORTNESS OF BREATH   [DISCONTINUED] Budeson-Glycopyrrol-Formoterol (BREZTRI AEROSPHERE) 160-9-4.8 MCG/ACT AERO Inhale 2 puffs into the lungs 2 (two) times daily.   No facility-administered encounter medications on file as of 02/11/2023.    Surgical History: Past Surgical History:  Procedure Laterality Date   ABDOMINAL AORTIC ANEURYSM REPAIR  2008   ABDOMINAL HYSTERECTOMY     AORTIC VALVE REPLACEMENT  2008   CARDIAC CATHETERIZATION     [facility]   CARDIOVERSION N/A 08/15/2018   Procedure: CARDIOVERSION;  Surgeon: Iran Ouch, MD;  Location: [facility] ORS;  Service: Cardiovascular;  Laterality: N/A;   CARDIOVERSION N/A 11/14/2018   Procedure: CARDIOVERSION (CATH LAB);  Surgeon: Iran Ouch, MD;  Location: [facility] OR[facility];  Service: Cardiovascular;  Laterality: N/A;   CARDIOVERSION N/A 06/05/2019   Procedure: CARDIOVERSION;  Surgeon: Iran Ouch, MD;  Location: [facility] OR[facility];  Service: Cardiovascular;  Laterality: N/A;   CARDIOVERSION N/A 08/14/2019   Procedure: CARDIOVERSION;  Surgeon: Iran Ouch, MD;  Location: [facility];  Service: Cardiovascular;  Laterality: N/A;   CARDIOVERSION N/A 11/09/2019   Procedure: CARDIOVERSION;  Surgeon: Marcina Millard, MD;  Location: [facility] ORS;  Service: Cardiovascular;  Laterality: N/A;   CARDIOVERSION N/A 01/17/2020   Procedure: CARDIOVERSION;  Surgeon: Marcina Millard, MD;  Location: [facility] OR[facility];  Service: Cardiovascular;  Laterality: N/A;   CARPAL TUNNEL RELEASE     ELECTROPHYSIOLOGIC STUDY N/A 08/17/2016   Procedure: Cardioversion;  Surgeon: Iran Ouch, MD;  Location: [facility] ORS;  Service: Cardiovascular;  Laterality: N/A;   TUMOR EXCISION Left    x3 (arm)    Medical History: Past Medical History:  Diagnosis Date   Aortic stenosis due to bicuspid  aortic valve    a. s/p bioprosthetic valve replacement 2008 at [facility];  b. 01/2015 Echo: EF 60-65%, no rwma, Gr1 DD, mildly dil LA, nl RV fxn.   Aspiration pneumonia ([name])    Atrial fibrillation with RVR ([name]) 01/11/2015   Atrial flutter ([name])    a. 08/2016 s/p DCCV.  Remains on flecainide 50 mg bid.   Basal skull fracture ([name]) 20 yrs ago   CHF (congestive heart failure) ([name])    Chronic respiratory failure ([name])    COPD (chronic obstructive pulmonary disease) ([name])    a. 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 2008 prior to Aortic aneurysm repair-->nl cors.   History of stress test    a. 10/2015 MV: no ischemia/infarct.   HLD (hyperlipidemia)  HTN (hypertension)    Hypothyroidism    Obesity    PAF (paroxysmal atrial fibrillation) ([name])    a. on Eliquis; b. CHADS2VASc = 3 (HTN, age x 1, female).   Paroxysmal atrial fibrillation ([name]) 01/24/2015   Right upper quadrant abdominal tenderness without rebound tenderness 03/02/2018   S/P ascending aortic aneurysm repair 2008    Family History: Family History  Problem Relation Age of Onset   Stroke Father    Stroke Paternal Grandmother     Social History   Socioeconomic History   Marital status: Single    Spouse name: Not on file   Number of children: Not on file   Years of education: Not on file   Highest education level: Not on file  Occupational History   Not on file  Tobacco Use   Smoking status: Former    Types: Cigarettes   Smokeless tobacco: Never  Vaping Use   Vaping Use: Never used  Substance and Sexual Activity   Alcohol use: No   Drug use: No   Sexual activity: Never    Birth control/protection: Surgical  Other Topics Concern   Not on file  Social History Narrative   Not on file   Social Determinants of Health   Financial Resource Strain: Low Risk  (08/04/2021)   Overall Financial  Resource Strain (CARDIA)    Difficulty of Paying Living Expenses: Not very hard  Food Insecurity: No Food Insecurity (02/02/2023)   Hunger Vital Sign    Worried About Running Out of Food in the Last Year: Never true    Ran Out of Food in the Last Year: Never true  Transportation Needs: No Transportation Needs (02/02/2023)   PRAPARE - Administrator, Civil Service (Medical): No    Lack of Transportation (Non-Medical): No  Physical Activity: Not on file  Stress: Not on file  Social Connections: Not on file  Intimate Partner Violence: Not At Risk (02/02/2023)   Humiliation, Afraid, Rape, and Kick questionnaire    Fear of Current or Ex-Partner: No    Emotionally Abused: No    Physically Abused: No    Sexually Abused: No      Review of Systems  Constitutional:  Positive for fatigue. Negative for chills and unexpected weight change.  HENT:  Positive for congestion and postnasal drip. Negative for rhinorrhea, sneezing and sore throat.   Eyes:  Negative for redness.  Respiratory:  Positive for cough, shortness of breath and wheezing. Negative for chest tightness.   Cardiovascular:  Negative for chest pain and palpitations.  Gastrointestinal:  Negative for abdominal pain, constipation, diarrhea, nausea and vomiting.  Genitourinary:  Negative for dysuria and frequency.  Musculoskeletal:  Negative for arthralgias, back pain, joint swelling and neck pain.  Skin:  Negative for rash.  Neurological: Negative.  Negative for tremors and numbness.  Hematological:  Negative for adenopathy. Does not bruise/bleed easily.  Psychiatric/Behavioral:  Negative for behavioral problems (Depression), sleep disturbance and suicidal ideas. The patient is not nervous/anxious.     Vital Signs: BP 130/80   Pulse 77   Temp 99 F (37.2 C)   Resp 16   Ht 4\' 10"  (1.473 m)   Wt 204 lb 9.6 oz (92.8 kg)   SpO2 95%   BMI 42.76 kg/m    Physical Exam Vitals reviewed.  Constitutional:      General:  She is not in acute distress.    Appearance: Normal appearance. She is obese. She is not ill-appearing.  HENT:  Head: Normocephalic and atraumatic.  Eyes:     Pupils: Pupils are equal, round, and reactive to light.  Cardiovascular:     Rate and Rhythm: Normal rate and regular rhythm.  Pulmonary:     Effort: Pulmonary effort is normal. No respiratory distress.     Breath sounds: No rales.  Skin:    General: Skin is warm and dry.  Neurological:     Mental Status: She is alert and oriented to person, place, and time.  Psychiatric:        Mood and Affect: Mood normal.        Behavior: Behavior normal.       Assessment/Plan: 1. Hospital discharge follow-up Reviewed hospital course and will order follow up labs for monitoring - Basic Metabolic Panel (BMET) - CBC w/Diff/Platelet - Magnesium  2. Chronic respiratory failure with hypoxia ([name]) Continue oxygen as before and will go ahead and treat with course of zpak for possible secondary infection - Basic Metabolic Panel (BMET) - CBC w/Diff/Platelet - Magnesium - azithromycin (ZITHROMAX) 250 MG tablet; Take one tab a day for 10 days for uri  Dispense: 10 tablet; Refill: 0  3. Chronic diastolic CHF (congestive heart failure) ([name]) Follow up with heart failure clinic  - Basic Metabolic Panel (BMET) - CBC w/Diff/Platelet - Magnesium  4. Upper respiratory tract infection, unspecified type Will go ahead and start zpak and mucinex, call if not improving   General Counseling: Tavi verbalizes understanding of the findings of todays visit and agrees with plan of treatment. I have discussed any further diagnostic evaluation that may be needed or ordered today. We also reviewed her medications today. she has been encouraged to call the office with any questions or concerns that should arise related to todays visit.    Counseling:    Orders Placed This Encounter  Procedures   Basic Metabolic Panel (BMET)   CBC w/Diff/Platelet    Magnesium    This patient was seen by Lynn Ito, PA-C in collaboration with Dr. Beverely Risen as a part of collaborative care agreement.   I have reviewed all medical records from hospital follow up including radiology reports and consults from other physicians. Appropriate follow up diagnostics will be scheduled as needed. Patient/ Family understands the plan of treatment. Time spent 35 minutes.   Dr Lyndon Code, MD Internal Medicine

## 2023-02-12 ENCOUNTER — Other Ambulatory Visit: Payer: Self-pay | Admitting: Nurse Practitioner

## 2023-02-12 ENCOUNTER — Ambulatory Visit: Payer: Medicare Other | Admitting: Family

## 2023-02-12 DIAGNOSIS — J449 Chronic obstructive pulmonary disease, unspecified: Secondary | ICD-10-CM

## 2023-02-15 ENCOUNTER — Ambulatory Visit: Payer: Medicare Other

## 2023-02-15 ENCOUNTER — Telehealth: Payer: Self-pay

## 2023-02-15 ENCOUNTER — Ambulatory Visit: Payer: Medicare Other | Admitting: Family

## 2023-02-15 ENCOUNTER — Other Ambulatory Visit: Payer: Self-pay

## 2023-02-15 MED ORDER — PREDNISONE 10 MG (21) PO TBPK: ORAL_TABLET | ORAL | 0 refills | Status: DC

## 2023-02-16 NOTE — Telephone Encounter (Signed)
Pt was notified.  

## 2023-02-17 ENCOUNTER — Ambulatory Visit: Payer: Medicare Other

## 2023-02-22 ENCOUNTER — Ambulatory Visit
Admission: RE | Admit: 2023-02-22 | Discharge: 2023-02-22 | Disposition: A | Discharge status: Home / Self Care | Payer: Medicare Other | Attending: Nurse Practitioner | Admitting: Nurse Practitioner

## 2023-02-22 ENCOUNTER — Telehealth: Payer: Self-pay

## 2023-02-22 ENCOUNTER — Ambulatory Visit: Payer: Medicare Other

## 2023-02-22 ENCOUNTER — Ambulatory Visit
Admission: RE | Admit: 2023-02-22 | Discharge: 2023-02-22 | Disposition: A | Discharge status: Home / Self Care | Payer: Medicare Other | Source: Ambulatory Visit | Attending: Nurse Practitioner | Admitting: Nurse Practitioner

## 2023-02-22 DIAGNOSIS — J9611 Chronic respiratory failure with hypoxia: Secondary | ICD-10-CM

## 2023-02-22 DIAGNOSIS — R059 Cough, unspecified: Secondary | ICD-10-CM | POA: Diagnosis not present

## 2023-02-22 DIAGNOSIS — R051 Acute cough: Secondary | ICD-10-CM

## 2023-02-22 DIAGNOSIS — R0602 Shortness of breath: Secondary | ICD-10-CM

## 2023-02-22 NOTE — Progress Notes (Signed)
Please let the patient know that her xray shows chronic lung disease with no acute findings related to infection or other findings that would explain her cough or continued SOB. Her lungs actually look improved when compared to previous imaging.

## 2023-02-22 NOTE — Telephone Encounter (Signed)
Spoke with patient and then she wanted to see dsk, so I gave her to Nubi to make an appointment.

## 2023-02-22 NOTE — Telephone Encounter (Signed)
[name] will call pt for that we ordered chest xray

## 2023-02-22 NOTE — Telephone Encounter (Signed)
-----   Message from Sallyanne Kuster, NP sent at 02/22/2023 11:29 AM EDT ----- Please let the patient know that her xray shows chronic lung disease with no acute findings related to infection or other findings that would explain her cough or continued SOB. Her lungs actually look improved when compared to previous imaging.

## 2023-02-24 ENCOUNTER — Ambulatory Visit: Payer: Medicare Other

## 2023-02-26 ENCOUNTER — Ambulatory Visit: Payer: Medicare Other

## 2023-03-01 ENCOUNTER — Ambulatory Visit (INDEPENDENT_AMBULATORY_CARE_PROVIDER_SITE_OTHER): Payer: Medicare Other | Admitting: Internal Medicine

## 2023-03-01 ENCOUNTER — Telehealth: Payer: Self-pay | Admitting: Internal Medicine

## 2023-03-01 ENCOUNTER — Encounter: Payer: Self-pay | Admitting: Internal Medicine

## 2023-03-01 ENCOUNTER — Other Ambulatory Visit: Payer: Self-pay | Admitting: Nurse Practitioner

## 2023-03-01 VITALS — BP 115/80 | HR 73 | Temp 97.9°F | Resp 16 | Ht <= 58 in | Wt 185.0 lb

## 2023-03-01 DIAGNOSIS — J9611 Chronic respiratory failure with hypoxia: Secondary | ICD-10-CM

## 2023-03-01 DIAGNOSIS — C3411 Malignant neoplasm of upper lobe, right bronchus or lung: Secondary | ICD-10-CM | POA: Diagnosis not present

## 2023-03-01 DIAGNOSIS — I5032 Chronic diastolic (congestive) heart failure: Secondary | ICD-10-CM | POA: Diagnosis not present

## 2023-03-01 DIAGNOSIS — R053 Chronic cough: Secondary | ICD-10-CM | POA: Diagnosis not present

## 2023-03-01 MED ORDER — HYDROCOD POLI-CHLORPHE POLI ER 10-8 MG/5ML PO SUER: 5.0000 mL | Freq: Two times a day (BID) | ORAL | 0 refills | Status: DC | PRN

## 2023-03-01 NOTE — Telephone Encounter (Signed)
Notified patient of CT appointment date, arrival time, location-[place] 

## 2023-03-01 NOTE — Progress Notes (Unsigned)
[facility] [address] [place], [place] [address]  Pulmonary Sleep Medicine   Office Visit Note  Patient Name: [name] DOB: Jul 12, 1946 MRN [id number]  Date of Service: 03/01/2023  Complaints/HPI: She has been having cough and shortness of breath. She has been getting RT for her cancer. Notes recent admission for a viral infection. She states she has cough all the time. Last CXR on 6/17 looks like some improvement. She has noted some congestion. No hemoptysis. She states the cough causes her to have some leakage in her pants.I would like to get a CT chest for follow and will order this now    Expand All Collapse All  Recommendations at discharge:     Follow up with Primary Care in 1-2 weeks Follow up with Pulmonology, Cardiology as scheduled or call if concerns or questions before next appt Repeat BMP, Mg, CBC in 1-2 weeks   Discharge Diagnoses: Principal Problem:   Heart failure due to valvular disease, acute on chronic, diastolic ([name]) Active Problems:   SOB (shortness of breath)   Chronic obstructive pulmonary disease (COPD) ([name])   Atrial fibrillation, chronic ([name])   Essential hypertension   Hypothyroidism   History of aortic valve replacement with bioprosthetic valve   Hypokalemia   Resolved Problems:   * No resolved hospital problems. *   Hospital Course:    [name] is a 77 y.o. female with multiple medical problems including but not limited to aortic stenosis s/p aortic valve replacement with bioprosthetic valve in 2008, s/p TAVR on 11/03/2022 at [facility], ascending aortic aneurysm s/p repair, history of AV node ablation and dual-chamber pacemaker placement in June 2021, paroxysmal atrial fibrillation s/p multiple cardioversions, severe COPD, chronic diastolic CHF, chronic hypoxic respiratory failure on 5 L/min oxygen via [place], stroke, right upper lobe lung cancer, hypertension, hyperlipidemia, anxiety, arthritis, sleep apnea, hearing  impairment, chronic anemia.  He presented to the hospital with cough, congestion, shortness of breath, chest tightness and leg swelling.   She was admitted to the hospital for CHF decompensation.  Being treated with IV bumetanide.   5/30 -- increase IV Bumex from 0.5 to 2 mg BID. 5/31 -- edema improving, URI symptoms persistent 6/1 -- LE edema resolved,  URI symptoms improved.  Pt is medically stable and agreeable to discharge home today.     Assessment and Plan:   Acute exacerbation of chronic diastolic CHF s/p TAVR in February 2024 for aortic stenosis, mild mitral regurgitation, moderate tricuspid regurgitation:  --Diuresed with IV Bumex -- edema resolved. Net IO Since Admission: -3,700 mL [02/06/23 1513]] --Resume home dose 2 mg PO Bumex BID at d/c --Close outpatient follow up with Cardiology and/or PCP   --Monitor BMP,  --Daily weights at home  Echo on 11/04/2022 showed normal LV function, moderate LVH, mild MR, moderate TR         Metapneumovirus infection / Sinus congestion -- check respiratory viral panel.  Recent exposure to family member with URI.  Negative for Covid and flu on admission. --RVP positive for metapneumovirus on 5/30 --Droplet precautions --Claritin, Flonase & other supportive care per orders --Tessalon Perles as needed for cough      Hypokalemia: Resolved with replacement, K 4.0. Monitor BMP, Mg      COPD and chronic hypoxic respiratory failure:  Continue bronchodilators.   At baseline, uses 5 L/min oxygen via [place].     Right upper lobe lung cancer, stage I: She's had 3 sessions of radiation therapy.  She said she was due for 2 more this week and that she does not think she can make it.   Outpatient follow-up with oncologist and radiation oncologist.      Office Spirometry Results:     ROS  General: (-) fever, (-) chills, (-) night sweats, (-) weakness Skin: (-) rashes, (-) itching,. Eyes: (-) visual changes, (-) redness, (-) itching. Nose  and Sinuses: (-) nasal stuffiness or itchiness, (-) postnasal drip, (-) nosebleeds, (-) sinus trouble. Mouth and Throat: (-) sore throat, (-) hoarseness. Neck: (-) swollen glands, (-) enlarged thyroid, (-) neck pain. Respiratory: + cough, (-) bloody sputum, + shortness of breath, + wheezing. Cardiovascular: - ankle swelling, (-) chest pain. Lymphatic: (-) lymph node enlargement. Neurologic: (-) numbness, (-) tingling. Psychiatric: (-) anxiety, (-) depression   Current Medication: Outpatient Encounter Medications as of 03/01/2023  Medication Sig   ALPRAZolam (XANAX) 0.25 MG tablet Take 1 tablet (0.25 mg total) by mouth 2 (two) times daily as needed for anxiety.   amLODipine (NORVASC) 5 MG tablet Take 5 mg by mouth in the morning and at bedtime.   atorvastatin (LIPITOR) 40 MG tablet TAKE 1 TABLET BY MOUTH EVERY NIGHT AT BEDTIME (Patient taking differently: Take 40 mg by mouth at bedtime. TAKE 1 TABLET BY MOUTH EVERY NIGHT AT BEDTIME)   azithromycin (ZITHROMAX) 250 MG tablet Take one tab a day for 10 days for uri   BREZTRI AEROSPHERE 160-9-4.8 MCG/ACT AERO INHALE 2 PUFFS INTO THE LUNGS TWICE DAILY   bumetanide (BUMEX) 1 MG tablet Take 2 tablets (2 mg total) by mouth 2 (two) times daily. And additional 2 tablets PM PRN   Calcium Carbonate-Vitamin D (CALCIUM 600+D PO) Take 1 tablet by mouth daily.   carvedilol (COREG) 25 MG tablet Take 25 mg by mouth 2 (two) times daily with a meal.   Coenzyme Q10 (COQ10) 200 MG CAPS Take 200 mg by mouth daily.   diclofenac Sodium (VOLTAREN) 1 % GEL Apply 2 g topically 4 (four) times daily.   ELIQUIS 5 MG TABS tablet TAKE 1 TABLET BY MOUTH TWICE DAILY   ferrous sulfate 325 (65 FE) MG tablet Take 1 tablet (325 mg total) by mouth daily with breakfast.   fluticasone (FLONASE) 50 MCG/ACT nasal spray Place 2 sprays into both nostrils daily.   guaiFENesin-dextromethorphan (ROBITUSSIN DM) 100-10 MG/5ML syrup Take 5 mLs by mouth every 4 (four) hours as needed for cough.    ipratropium-albuterol (DUONEB) 0.5-2.5 (3) MG/3ML SOLN Inhale 3 mLs into the lungs every 4 (four) hours as needed. Inhale 3 mls into the lungs every 4 to 6 hours and as needed   JARDIANCE 10 MG TABS tablet TAKE 1 TABLET(10 MG) BY MOUTH DAILY BEFORE BREAKFAST (Patient taking differently: Take 10 mg by mouth daily.)   levothyroxine (SYNTHROID) 25 MCG tablet TAKE 1 TABLET BY MOUTH DAILY BEFORE BREAKFAST   lisinopril (ZESTRIL) 40 MG tablet TAKE 1 TABLET(40 MG) BY MOUTH DAILY (Patient taking differently: Take 40 mg by mouth daily.)   loratadine (CLARITIN) 10 MG tablet Take 10 mg by mouth daily.   methocarbamol (ROBAXIN) 500 MG tablet Take 1 tablet (500 mg total) by mouth every 6 (six) hours as needed for muscle spasms.   oxyCODONE-acetaminophen (PERCOCET) 7.5-325 MG tablet Take one tab po bid for pain severe (Patient taking differently: Take 1 tablet by mouth 2 (two) times daily as needed for severe pain. Take one tab po bid for pain severe)   pantoprazole (PROTONIX) 40 MG tablet TAKE 1 TABLET BY  MOUTH EVERY DAY   potassium chloride (KLOR-CON) 10 MEQ tablet TAKE 4 TABLETS BY MOUTH TWICE DAILY   predniSONE (STERAPRED UNI-PAK 21 TAB) 10 MG (21) TBPK tablet Use as directed for 6 days   revefenacin (YUPELRI) 175 MCG/3ML nebulizer solution Take 3 mLs (175 mcg total) by nebulization daily.   sertraline (ZOLOFT) 100 MG tablet TAKE 1 TABLET BY MOUTH ONCE A DAY FOR ANXIETY (Patient taking differently: Take 100 mg by mouth daily. TAKE 1 TABLET BY MOUTH ONCE A DAY FOR ANXIETY)   sodium chloride (OCEAN) 0.65 % SOLN nasal spray Place 1 spray into both nostrils as needed for congestion.   traZODone (DESYREL) 50 MG tablet TAKE 1 TABLET BY MOUTH EVERY DAY AT BEDTIME   VENTOLIN HFA 108 (90 Base) MCG/ACT inhaler INHALE 2 PUFFS INTO THE LUNGS EVERY 6 HOURS AS NEEDED FOR WHEEZING OR SHORTNESS OF BREATH   No facility-administered encounter medications on file as of 03/01/2023.    Surgical History: Past Surgical History:   Procedure Laterality Date   ABDOMINAL AORTIC ANEURYSM REPAIR  2008   ABDOMINAL HYSTERECTOMY     AORTIC VALVE REPLACEMENT  2008   CARDIAC CATHETERIZATION     [facility]   CARDIOVERSION N/A 08/15/2018   Procedure: CARDIOVERSION;  Surgeon: Iran Ouch, MD;  Location: [facility] ORS;  Service: Cardiovascular;  Laterality: N/A;   CARDIOVERSION N/A 11/14/2018   Procedure: CARDIOVERSION (CATH LAB);  Surgeon: Iran Ouch, MD;  Location: [facility] ORS;  Service: Cardiovascular;  Laterality: N/A;   CARDIOVERSION N/A 06/05/2019   Procedure: CARDIOVERSION;  Surgeon: Iran Ouch, MD;  Location: [facility] ORS;  Service: Cardiovascular;  Laterality: N/A;   CARDIOVERSION N/A 08/14/2019   Procedure: CARDIOVERSION;  Surgeon: Iran Ouch, MD;  Location: [facility] ORS;  Service: Cardiovascular;  Laterality: N/A;   CARDIOVERSION N/A 11/09/2019   Procedure: CARDIOVERSION;  Surgeon: Marcina Millard, MD;  Location: [facility] ORS;  Service: Cardiovascular;  Laterality: N/A;   CARDIOVERSION N/A 01/17/2020   Procedure: CARDIOVERSION;  Surgeon: Marcina Millard, MD;  Location: [facility] ORS;  Service: Cardiovascular;  Laterality: N/A;   CARPAL TUNNEL RELEASE     ELECTROPHYSIOLOGIC STUDY N/A 08/17/2016   Procedure: Cardioversion;  Surgeon: Iran Ouch, MD;  Location: [facility] ORS;  Service: Cardiovascular;  Laterality: N/A;   TUMOR EXCISION Left    x3 (arm)    Medical History: Past Medical History:  Diagnosis Date   Aortic 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 s/p bioprosthetic valve replacement 2008 at [facility];  b. 01/2015 Echo: EF 60-65%, no rwma, Gr1 DD, mildly dil LA, nl RV fxn.   Aspiration pneumonia ([name])    Atrial fibrillation with RVR ([name]) 01/11/2015   Atrial flutter ([name])    a. 08/2016 s/p DCCV.  Remains on flecainide 50 mg bid.   Basal skull fracture ([name]) 20 yrs ago   CHF (congestive heart failure) ([name])    Chronic respiratory failure ([name])    COPD (chronic obstructive pulmonary disease) ([name])    a. 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 2008 prior to Aortic aneurysm repair-->nl cors.   History of stress test    a. 10/2015 MV: no ischemia/infarct.   HLD (hyperlipidemia)    HTN (hypertension)    Hypothyroidism    Obesity    PAF (paroxysmal atrial fibrillation) ([name])  a. on Eliquis; b. CHADS2VASc = 3 (HTN, age x 1, female).   Paroxysmal atrial fibrillation ([name]) 01/24/2015   Right upper quadrant abdominal tenderness without rebound tenderness 03/02/2018   S/P ascending aortic aneurysm repair 2008    Family History: Family History  Problem Relation Age of Onset   Stroke Father    Stroke Paternal Grandmother     Social History: Social History   Socioeconomic History   Marital status: Single    Spouse name: Not on file   Number of children: Not on file   Years of education: Not on file   Highest education level: Not on file  Occupational History   Not on file  Tobacco Use   Smoking status: Former    Types: Cigarettes   Smokeless tobacco: Never  Vaping Use   Vaping Use: Never used  Substance and Sexual Activity   Alcohol use: No   Drug use: No   Sexual activity: Never    Birth control/protection: Surgical  Other Topics Concern   Not on file  Social History Narrative   Not on file   Social Determinants of Health   Financial Resource Strain: Low Risk  (08/04/2021)   Overall Financial Resource Strain (CARDIA)    Difficulty of Paying Living Expenses: Not very hard  Food Insecurity: No Food Insecurity (02/02/2023)   Hunger Vital Sign    Worried About Running Out of Food in the Last Year: Never true    Ran Out of Food in the Last Year: Never true  Transportation Needs: No Transportation Needs (02/02/2023)   PRAPARE - Administrator, Civil Service (Medical): No    Lack of Transportation (Non-Medical): No  Physical Activity: Not on file   Stress: Not on file  Social Connections: Not on file  Intimate Partner Violence: Not At Risk (02/02/2023)   Humiliation, Afraid, Rape, and Kick questionnaire    Fear of Current or Ex-Partner: No    Emotionally Abused: No    Physically Abused: No    Sexually Abused: No    Vital Signs: Blood pressure 115/80, pulse 73, temperature 97.9 F (36.6 C), resp. rate 16, height 4\' 10"  (1.473 m), weight 185 lb (83.9 kg), SpO2 95 %.  Examination: General Appearance: The patient is well-developed, well-nourished, and in no distress. Skin: Gross inspection of skin unremarkable. Head: normocephalic, no gross deformities. Eyes: no gross deformities noted. ENT: ears appear grossly normal no exudates. Neck: Supple. No thyromegaly. No LAD. Respiratory: few rhonch noted. Cardiovascular: Normal S1 and S2 without murmur or rub. Extremities: No cyanosis. pulses are equal. Neurologic: Alert and oriented. No involuntary movements.  LABS: Recent Results (from the past 2160 hour(s))  Rad Onc Aria Session Summary     Status: None   Collection Time: 01/21/23  1:53 PM  Result Value Ref Range   Course ID [email or web address]    Course Intent Curative    Course Start Date 01/11/2023  4:57 PM    Session Number 1    Course First Treatment Date 01/21/2023  1:49 PM    Course Last Treatment Date 01/21/2023  1:51 PM    Course Elapsed Days 0    Reference Point ID Rt Lung SBRT    Reference Point Dosage Given to Date 16.10960454 Gy   Reference Point Session Dosage Given 09.81191478 Gy   Plan ID [email or web address]    Plan Fractions Treated to Date 1    Plan Total Fractions Prescribed 5    Plan Prescribed Dose Per Fraction 12  Gy   Plan Total Prescribed Dose 60.000000 Gy   Plan Primary Reference Point Rt Lung SBRT   Rad Onc Aria Session Summary     Status: None   Collection Time: 01/25/23  2:51 PM  Result Value Ref Range   Course ID [email or web address]    Course Intent Curative    Course Start Date 01/11/2023  4:57 PM    Session Number 2     Course First Treatment Date 01/21/2023  1:49 PM    Course Last Treatment Date 01/25/2023  2:49 PM    Course Elapsed Days 4    Reference Point ID Rt Lung SBRT    Reference Point Dosage Given to Date 23.99999998 Gy   Reference Point Session Dosage Given 16.10960454 Gy   Plan ID [email or web address]    Plan Fractions Treated to Date 2    Plan Total Fractions Prescribed 5    Plan Prescribed Dose Per Fraction 12 Gy   Plan Total Prescribed Dose 60.000000 Gy   Plan Primary Reference Point Rt Lung SBRT   Rad Onc Aria Session Summary     Status: None   Collection Time: 01/27/23 11:46 AM  Result Value Ref Range   Course ID [email or web address]    Course Intent Curative    Course Start Date 01/11/2023  4:57 PM    Session Number 3    Course First Treatment Date 01/21/2023  1:49 PM    Course Last Treatment Date 01/27/2023 11:44 AM    Course Elapsed Days 6    Reference Point ID Rt Lung SBRT    Reference Point Dosage Given to Date 09.81191478 Gy   Reference Point Session Dosage Given 29.56213086 Gy   Plan ID [email or web address]    Plan Fractions Treated to Date 3    Plan Total Fractions Prescribed 5    Plan Prescribed Dose Per Fraction 12 Gy   Plan Total Prescribed Dose 60.000000 Gy   Plan Primary Reference Point Rt Lung SBRT   CBC     Status: Abnormal   Collection Time: 02/01/23  8:48 PM  Result Value Ref Range   WBC 5.4 4.0 - 10.5 K/uL   RBC 4.00 3.87 - 5.11 MIL/uL   Hemoglobin 11.2 (L) 12.0 - 15.0 g/dL   HCT 57.8 46.9 - 62.9 %   MCV 90.8 80.0 - 100.0 fL   MCH 28.0 26.0 - 34.0 pg   MCHC 30.9 30.0 - 36.0 g/dL   RDW 52.8 (H) 41.3 - 24.4 %   Platelets 235 150 - 400 K/uL   nRBC 0.0 0.0 - 0.2 %    Comment: Performed at [facility], [address] [place], [place] [address]  Basic metabolic panel     Status: Abnormal   Collection Time: 02/01/23  8:48 PM  Result Value Ref Range   Sodium 140 135 - 145 mmol/L   Potassium 3.3 (L) 3.5 - 5.1 mmol/L   Chloride 98 98 - 111 mmol/L   CO2 31 22 - 32 mmol/L   Glucose,  Bld 112 (H) 70 - 99 mg/dL    Comment: Glucose reference range applies only to samples taken after fasting for at least 8 hours.   BUN 18 8 - 23 mg/dL   Creatinine, Ser 2.53 0.44 - 1.00 mg/dL   Calcium 8.8 (L) 8.9 - 10.3 mg/dL   GFR, Estimated >66 >44 mL/min    Comment: (NOTE) Calculated using the CKD-EPI Creatinine Equation (2021)    Anion gap 11 5 - 15  Comment: Performed at [facility], [address] [place], [place] [address]  Troponin I (High Sensitivity)     Status: None   Collection Time: 02/01/23  8:48 PM  Result Value Ref Range   Troponin I (High Sensitivity) 15 <18 ng/L    Comment: (NOTE) Elevated high sensitivity troponin I (hsTnI) values and significant  changes across serial measurements may suggest ACS but many other  chronic and acute conditions are known to elevate hsTnI results.  Refer to the "Links" section for chest pain algorithms and additional  guidance. Performed at [facility], [address] [place], [place] [address]   Brain natriuretic peptide     Status: Abnormal   Collection Time: 02/01/23  8:48 PM  Result Value Ref Range   B Natriuretic Peptide 330.6 (H) 0.0 - 100.0 pg/mL    Comment: Performed at [facility], [address] [place], [place] [address]  Resp Panel by RT-PCR (Flu A&B, Covid) Anterior Nasal Swab     Status: None   Collection Time: 02/01/23  9:03 PM   Specimen: Anterior Nasal Swab  Result Value Ref Range   SARS Coronavirus 2 by RT PCR NEGATIVE NEGATIVE    Comment: (NOTE) SARS-CoV-2 target nucleic acids are NOT DETECTED.  The SARS-CoV-2 RNA is generally detectable in upper respiratory specimens during the acute phase of infection. The lowest concentration of SARS-CoV-2 viral copies this assay can detect is 138 copies/mL. A negative result does not preclude SARS-Cov-2 infection and should not be used as the sole basis for treatment or other patient management decisions. A negative result may occur  with  improper specimen collection/handling, submission of specimen other than nasopharyngeal swab, presence of viral mutation(s) within the areas targeted by this assay, and inadequate number of viral copies(<138 copies/mL). A negative result must be combined with clinical observations, patient history, and epidemiological information. The expected result is Negative.  Fact Sheet for Patients:  BloggerCourse.com  Fact Sheet for Healthcare Providers:  SeriousBroker.it  This test is no t yet approved or cleared by the [place] FDA and  has been authorized for detection and/or diagnosis of SARS-CoV-2 by FDA under an Emergency Use Authorization (EUA). This EUA will remain  in effect (meaning this test can be used) for the duration of the COVID-19 declaration under Section 564(b)(1) of the Act, 21 U.S.C.section 360bbb-3(b)(1), unless the authorization is terminated  or revoked sooner.       Influenza A by PCR NEGATIVE NEGATIVE   Influenza B by PCR NEGATIVE NEGATIVE    Comment: (NOTE) The Xpert Xpress SARS-CoV-2/FLU/RSV plus assay is intended as an aid in the diagnosis of influenza from Nasopharyngeal swab specimens and should not be used as a sole basis for treatment. Nasal washings and aspirates are unacceptable for Xpert Xpress SARS-CoV-2/FLU/RSV testing.  Fact Sheet for Patients: BloggerCourse.com  Fact Sheet for Healthcare Providers: SeriousBroker.it  This test is not yet approved or cleared by the [place] FDA and has been authorized for detection and/or diagnosis of SARS-CoV-2 by FDA under an Emergency Use Authorization (EUA). This EUA will remain in effect (meaning this test can be used) for the duration of the COVID-19 declaration under Section 564(b)(1) of the Act, 21 U.S.C. section 360bbb-3(b)(1), unless the authorization is terminated or revoked.  Performed  at [facility] [place] [facility], [address], [place], [place] [address]   Hepatic function panel     Status: None   Collection Time: 02/01/23 10:40 PM  Result Value Ref Range  Total Protein 6.9 6.5 - 8.1 g/dL   Albumin 3.7 3.5 - 5.0 g/dL   AST 20 15 - 41 U/L   ALT 17 0 - 44 U/L   Alkaline Phosphatase 80 38 - 126 U/L   Total Bilirubin 0.6 0.3 - 1.2 mg/dL   Bilirubin, Direct <0.1 0.0 - 0.2 mg/dL   Indirect Bilirubin NOT CALCULATED 0.3 - 0.9 mg/dL    Comment: Performed at [facility], [address] [place], [place] [address]  T4, free     Status: None   Collection Time: 02/01/23 10:40 PM  Result Value Ref Range   Free T4 1.02 0.61 - 1.12 ng/dL    Comment: (NOTE) Biotin ingestion may interfere with free T4 tests. If the results are inconsistent with the TSH level, previous test results, or the clinical presentation, then consider biotin interference. If needed, order repeat testing after stopping biotin. Performed at [facility], [address] [place], [place] [address]   TSH     Status: None   Collection Time: 02/01/23 10:40 PM  Result Value Ref Range   TSH 1.382 0.350 - 4.500 uIU/mL    Comment: Performed by a 3rd Generation assay with a functional sensitivity of <=0.01 uIU/mL. Performed at [facility], [address] [place], [place] [address]   Basic metabolic panel     Status: Abnormal   Collection Time: 02/02/23 11:53 AM  Result Value Ref Range   Sodium 136 135 - 145 mmol/L   Potassium 3.1 (L) 3.5 - 5.1 mmol/L   Chloride 95 (L) 98 - 111 mmol/L   CO2 29 22 - 32 mmol/L   Glucose, Bld 130 (H) 70 - 99 mg/dL    Comment: Glucose reference range applies only to samples taken after fasting for at least 8 hours.   BUN 20 8 - 23 mg/dL   Creatinine, Ser 2.54 0.44 - 1.00 mg/dL   Calcium 9.1 8.9 - 27.0 mg/dL   GFR, Estimated >62 >37 mL/min    Comment: (NOTE) Calculated using the CKD-EPI Creatinine Equation (2021)    Anion gap 12 5 - 15     Comment: Performed at [facility], [address] [place], [place] [address]  Magnesium     Status: None   Collection Time: 02/02/23 11:53 AM  Result Value Ref Range   Magnesium 2.2 1.7 - 2.4 mg/dL    Comment: Performed at [facility], [address] [place], [place] [address]  Basic metabolic panel     Status: Abnormal   Collection Time: 02/03/23  8:41 AM  Result Value Ref Range   Sodium 140 135 - 145 mmol/L   Potassium 3.3 (L) 3.5 - 5.1 mmol/L   Chloride 101 98 - 111 mmol/L   CO2 28 22 - 32 mmol/L   Glucose, Bld 91 70 - 99 mg/dL    Comment: Glucose reference range applies only to samples taken after fasting for at least 8 hours.   BUN 22 8 - 23 mg/dL   Creatinine, Ser 6.07 0.44 - 1.00 mg/dL   Calcium 8.9 8.9 - 37.1 mg/dL   GFR, Estimated >06 >26 mL/min    Comment: (NOTE) Calculated using the CKD-EPI Creatinine Equation (2021)    Anion gap 11 5 - 15    Comment: Performed at [facility], [address] [place], [place] [address]  Magnesium     Status: None   Collection Time: 02/03/23  8:41 AM  Result Value Ref Range  Magnesium 2.4 1.7 - 2.4 mg/dL    Comment: Performed at [facility], [address] [place], [place] [address]  Basic metabolic panel     Status: Abnormal   Collection Time: 02/04/23  9:35 AM  Result Value Ref Range   Sodium 136 135 - 145 mmol/L   Potassium 4.0 3.5 - 5.1 mmol/L   Chloride 98 98 - 111 mmol/L   CO2 27 22 - 32 mmol/L   Glucose, Bld 145 (H) 70 - 99 mg/dL    Comment: Glucose reference range applies only to samples taken after fasting for at least 8 hours.   BUN 18 8 - 23 mg/dL   Creatinine, Ser 0.10 0.44 - 1.00 mg/dL   Calcium 8.8 (L) 8.9 - 10.3 mg/dL   GFR, Estimated >27 >25 mL/min    Comment: (NOTE) Calculated using the CKD-EPI Creatinine Equation (2021)    Anion gap 11 5 - 15    Comment: Performed at [facility], [address] [place], [place] [address]  Respiratory (~20 pathogens)  panel by PCR     Status: Abnormal   Collection Time: 02/04/23  2:30 PM   Specimen: Nasopharyngeal Swab; Respiratory  Result Value Ref Range   Adenovirus NOT DETECTED NOT DETECTED   Coronavirus 229E NOT DETECTED NOT DETECTED    Comment: (NOTE) The Coronavirus on the Respiratory Panel, DOES NOT test for the novel  Coronavirus (2019 nCoV)    Coronavirus HKU1 NOT DETECTED NOT DETECTED   Coronavirus NL63 NOT DETECTED NOT DETECTED   Coronavirus OC43 NOT DETECTED NOT DETECTED   Metapneumovirus DETECTED (A) NOT DETECTED   Rhinovirus / Enterovirus NOT DETECTED NOT DETECTED   Influenza A NOT DETECTED NOT DETECTED   Influenza B NOT DETECTED NOT DETECTED   Parainfluenza Virus 1 NOT DETECTED NOT DETECTED   Parainfluenza Virus 2 NOT DETECTED NOT DETECTED   Parainfluenza Virus 3 NOT DETECTED NOT DETECTED   Parainfluenza Virus 4 NOT DETECTED NOT DETECTED   Respiratory Syncytial Virus NOT DETECTED NOT DETECTED   Bordetella pertussis NOT DETECTED NOT DETECTED   Bordetella Parapertussis NOT DETECTED NOT DETECTED   Chlamydophila pneumoniae NOT DETECTED NOT DETECTED   Mycoplasma pneumoniae NOT DETECTED NOT DETECTED    Comment: Performed at [facility], [address] [place], [place] [address]  Basic metabolic panel     Status: Abnormal   Collection Time: 02/05/23  4:04 AM  Result Value Ref Range   Sodium 138 135 - 145 mmol/L   Potassium 4.3 3.5 - 5.1 mmol/L   Chloride 101 98 - 111 mmol/L   CO2 30 22 - 32 mmol/L   Glucose, Bld 97 70 - 99 mg/dL    Comment: Glucose reference range applies only to samples taken after fasting for at least 8 hours.   BUN 18 8 - 23 mg/dL   Creatinine, Ser 2.59 0.44 - 1.00 mg/dL   Calcium 8.5 (L) 8.9 - 10.3 mg/dL   GFR, Estimated >56 >38 mL/min    Comment: (NOTE) Calculated using the CKD-EPI Creatinine Equation (2021)    Anion gap 7 5 - 15    Comment: Performed at [facility], [address] [place], [place] [address]  Basic metabolic panel      Status: Abnormal   Collection Time: 02/06/23  4:21 AM  Result Value Ref Range   Sodium 138 135 - 145 mmol/L   Potassium 4.1 3.5 - 5.1 mmol/L   Chloride 98 98 - 111 mmol/L   CO2  31 22 - 32 mmol/L   Glucose, Bld 103 (H) 70 - 99 mg/dL    Comment: Glucose reference range applies only to samples taken after fasting for at least 8 hours.   BUN 17 8 - 23 mg/dL   Creatinine, Ser 1.61 0.44 - 1.00 mg/dL   Calcium 8.8 (L) 8.9 - 10.3 mg/dL   GFR, Estimated >09 >60 mL/min    Comment: (NOTE) Calculated using the CKD-EPI Creatinine Equation (2021)    Anion gap 9 5 - 15    Comment: Performed at [facility], [address] [place], [place] [address]    Radiology: DG Chest 2 View  Result Date: 02/22/2023 CLINICAL DATA:  Persistent cough and shortness of breath. History of lung cancer, COPD, and CHF. EXAM: CHEST - 2 VIEW COMPARISON:  Chest radiograph 02/01/2023 FINDINGS: Sequelae of aortic valve replacement are again identified. The cardiomediastinal silhouette is unchanged. A pacemaker and aortic atherosclerosis are noted. There is an overall improved appearance of the lungs with decreased vascular congestion and interstitial opacities. Basilar predominant interstitial densities appear largely chronic based on multiple prior studies (including chest radiographs from 11/11/2021). No acute airspace consolidation, sizeable pleural effusion, or pneumothorax is identified. Clips project over the right upper lobe. No acute osseous abnormality is seen. IMPRESSION: Improved appearance of the chest with underlying chronic interstitial lung disease and no definite acute finding. Electronically Signed   By: Sebastian Ache M.D.   On: 02/22/2023 10:48    No results found.  DG Chest 2 View  Result Date: 02/22/2023 CLINICAL DATA:  Persistent cough and shortness of breath. History of lung cancer, COPD, and CHF. EXAM: CHEST - 2 VIEW COMPARISON:  Chest radiograph 02/01/2023 FINDINGS: Sequelae of aortic valve  replacement are again identified. The cardiomediastinal silhouette is unchanged. A pacemaker and aortic atherosclerosis are noted. There is an overall improved appearance of the lungs with decreased vascular congestion and interstitial opacities. Basilar predominant interstitial densities appear largely chronic based on multiple prior studies (including chest radiographs from 11/11/2021). No acute airspace consolidation, sizeable pleural effusion, or pneumothorax is identified. Clips project over the right upper lobe. No acute osseous abnormality is seen. IMPRESSION: Improved appearance of the chest with underlying chronic interstitial lung disease and no definite acute finding. Electronically Signed   By: Sebastian Ache M.D.   On: 02/22/2023 10:48   DG Chest Portable 1 View  Result Date: 02/01/2023 CLINICAL DATA:  Cough, wheezing and dyspnea. EXAM: PORTABLE CHEST 1 VIEW COMPARISON:  Jan 15, 2023 FINDINGS: There is stable dual lead AICD positioning. Multiple sternal wires are noted. The cardiac silhouette is mildly enlarged and unchanged in size. An artificial aortic valve is in place. Diffuse, mild to moderate severity increased interstitial lung markings are seen with mild perihilar prominence of the pulmonary vasculature. Mild atelectatic changes are seen within the bilateral lung bases. No pleural effusion or pneumothorax is identified. Multilevel degenerative changes seen throughout the thoracic spine. IMPRESSION: 1. Evidence of prior median sternotomy with artificial aortic valve placement. 2. Findings consistent with mild to moderate severity interstitial edema. Electronically Signed   By: Aram Candela M.D.   On: 02/01/2023 21:20    Assessment and Plan: Patient Active Problem List   Diagnosis Date Noted   SOB (shortness of breath) 02/01/2023   Hypokalemia 08/04/2022   Chronic upper back pain 04/14/2022   Chronic myofascial pain 04/14/2022   Musculoskeletal disorder involving upper trapezius  muscle 04/14/2022   Abnormal CT scan, cervical spine (03/04/2022) 04/01/2022  Trigger point of shoulder region (Bilateral) 04/01/2022   Trigger point with neck pain 04/01/2022   DISH (diffuse idiopathic skeletal hyperostosis) 03/12/2022   Atrial fibrillation, chronic ([name]) 01/26/2022   Morbid obesity ([name]) 01/26/2022   Chronic respiratory failure with hypoxia ([name]) 01/26/2022   Bilateral pneumonia 01/25/2022   Chronic intractable headache 01/07/2022   Chronic diastolic CHF (congestive heart failure) ([name]) 12/02/2021   HLD (hyperlipidemia) 12/02/2021   Stroke ([name]) 12/02/2021   Iron deficiency anemia 12/02/2021   Depression 12/02/2021   Acute on chronic respiratory failure with hypoxia ([name]) 11/11/2021   Unable to maintain body in lying position 10/16/2021   Orthopnea 10/16/2021   Class 2 obesity with alveolar hypoventilation, serious comorbidity, and body mass index (BMI) of 39.0 to 39.9 in adult ([name]) 10/16/2021   At high risk for postoperative complications 10/16/2021   Hearing loss 08/12/2021   Long term prescription benzodiazepine use 04/30/2021   Chronic shoulder pain (1ry area of Pain) (Bilateral) (L>R) 04/30/2021   Chronic upper extremity pain (2ry area of Pain) (Bilateral) (L>R) 04/30/2021   Cervicalgia 04/30/2021   Chronic neck pain (3ry area of Pain) (Posterior) (Bilateral) (L>R) 04/30/2021   Shoulder blade pain (4th area of Pain) (Left) 04/30/2021   Osteoarthritis of glenohumeral joint (Left) 04/30/2021   Osteoarthritis of AC (acromioclavicular) joint (Left) 04/30/2021   Osteoarthritis of glenohumeral joints (Bilateral) 04/30/2021   Osteoarthritis of acromioclavicular joints (Bilateral) 04/30/2021   Primary osteoarthritis of shoulders (Bilateral) 04/30/2021   DDD (degenerative disc disease), cervical 04/30/2021   Cervical radiculitis (Left) 04/30/2021   Cervical radiculopathy (Left) 04/30/2021   C6 radiculopathy (Left) 04/30/2021   C7 radiculopathy (Left) 04/30/2021    Wheelchair dependence 04/30/2021   Chronic pain syndrome 04/29/2021   Pharmacologic therapy 04/29/2021   Disorder of skeletal system 04/29/2021   Problems influencing health status 04/29/2021   Chronic anticoagulation (Eliquis) 03/14/2021   Hx of atrioventricular node ablation 03/14/2021   Symptomatic anemia 01/29/2021   Heart failure due to valvular disease, acute on chronic, diastolic ([name]) 05/11/2020   Chronic obstructive pulmonary disease (COPD) ([name])    Hypothyroidism    Cardiac pacemaker in situ 05/10/2020   Acute non-recurrent frontal sinusitis 04/22/2020   Vertigo 04/22/2020   S/P placement of cardiac pacemaker 02/21/2020   Atrial fibrillation status post cardioversion ([name]) 11/16/2019   Episode of moderate major depression ([name]) 10/10/2019   Persistent atrial fibrillation ([name])    Encounter for general adult medical examination with abnormal findings 07/10/2019   Encounter for screening mammogram for malignant neoplasm of breast 07/10/2019   Atopic dermatitis 05/02/2019   Paroxysmal atrial flutter ([name]) 08/11/2018   Encounter for long-term (current) use of medications 07/09/2018   Chronic left shoulder pain 07/09/2018   Conjunctivitis 07/09/2018   Oxygen dependent 07/09/2018   GAD (generalized anxiety disorder) 07/09/2018   Ovarian failure 07/09/2018   Positive colorectal cancer screening using Cologuard test 04/24/2018   Calculus of gallbladder with acute on chronic cholecystitis 04/08/2018   H/O: CVA (cerebrovascular accident) 04/08/2018   Dysuria 03/02/2018   Right upper quadrant abdominal tenderness without rebound tenderness 03/02/2018   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 exacerbation 03/02/2018   Chronic obstructive pulmonary disease ([name]) 09/19/2017   Typical atrial flutter ([name])    Irritable bowel syndrome without diarrhea 01/29/2015   Essential hypertension 01/24/2015   Paroxysmal atrial fibrillation ([name]) 01/24/2015   History of aortic valve replacement  with bioprosthetic valve 01/24/2015   Atrial fibrillation with RVR ([name]) 01/11/2015   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 05/18/2014  Shoulder impingement syndrome (Left) 05/18/2014   Cellulitis and abscess 02/13/2014   MRSA (methicillin resistant Staphylococcus aureus) 02/13/2014   Aneurysm, ascending aorta ([name]) 06/23/2013   Bicuspid aortic valve 06/23/2013    1. Chronic respiratory failure with hypoxia ([name]) She is on oxygen therapy her saturations are good she needs to continue with the current oxygen set up as is.  She does still have some issues with desaturations with ambulation.  2. Chronic diastolic CHF (congestive heart failure) ([name]) Optimize fluid status follow-up with cardiologist  3. Chronic cough With her history of cancer I am concerned about the findings of cough therefore we will get a workup for the cough and I have suggested getting a CT scan of the chest. - CT Chest High Resolution; Future  4. Malignant neoplasm of right upper lobe of lung [facility]) She is following with oncology and will continue to do so - CT Chest High Resolution; Future   General Counseling: I have discussed the findings of the evaluation and examination with Laurynn.  I have also discussed any further diagnostic evaluation thatmay be needed or ordered today. Oliviana verbalizes understanding of the findings of todays visit. We also reviewed her medications today and discussed drug interactions and side effects including but not limited excessive drowsiness and altered mental states. We also discussed that there is always a risk not just to her but also people around her. she has been encouraged to call the office with any questions or concerns that should arise related to todays visit.  No orders of the defined types were placed in this encounter.    Time spent: 25  I have personally obtained a history, examined the patient, evaluated laboratory and imaging results, formulated  the assessment and plan and placed orders.    Yevonne Pax, MD [facility] Pulmonary and Critical Care Sleep medicine

## 2023-03-02 ENCOUNTER — Ambulatory Visit: Payer: Medicare Other

## 2023-03-02 ENCOUNTER — Ambulatory Visit: Admission: RE | Admit: 2023-03-02 | Payer: Medicare Other | Source: Ambulatory Visit

## 2023-03-02 NOTE — Progress Notes (Deleted)
PCP: Primary Cardiologist:  HPI:  [name] is a 77 y/o female with a history of hyperlipidemia, HTN, thyroid disease, COPD, left ear deafness, atrial fibrillation, previous tobacco use and chronic heart failure.   Echo 11/04/22: EF >55%, moderate LVH Echo 07/24/22: EF of >55% along with mild LVH. Echo 11/11/21: EF of 60-65%. Echo 05/16/21: EF of 45% along with mild LVH. Echo 02/07/20: EF of >55% along with severe LAE.   Cath 10/19/22: Reduced cardiac index at rest with PCWP 13 mmHg.  Moderate increase in PA pressure; PVR 3.9 Wu.  RA pressure 8 mm Hg. No obstructive CAD; left dominant system.   Catheterization done 02/07/20 but unable to view the results.   Admitted 11/02/22 due to TAVR. Admitted 08/04/22 due to worsening shortness of breath with exertion from her baseline associated with increased abdominal girth and orthopnea for about 4 days. Initially given IV lasix with transition to oral diuretics. Cardiology consult obtained. Discharged after 3 days. Was in the ED 07/10/22 due to upper abdominal pain. Serum labs are all normal. Chest x-ray and right upper quadrant ultrasound also unremarkable.  She has known cholelithiasis which is again demonstrated today.    She presents today for a HF follow-up visit with a chief complaint of moderate SOB with minimal exertion. Describes this as chronic in nature. Has associated fatigue, cough, constipation and chronic pain. Denies any difficulty sleeping, abdominal distention, palpitations, pedal edema, chest pain or dizziness.   Had TAVR since she was last here and reports that she is still recovering from this. Has fluctuating weight but also says that she's experienced some constipation and bloating along with this.   Oxygen is at 5L although her portable tank is a pulse tank and only goes to 3L.    ROS: All systems negative except as listed in HPI, PMH and Problem List.  SH:  Social History   Socioeconomic History   Marital status: Single    Spouse  name: Not on file   Number of children: Not on file   Years of education: Not on file   Highest education level: Not on file  Occupational History   Not on file  Tobacco Use   Smoking status: Former    Types: Cigarettes   Smokeless tobacco: Never  Vaping Use   Vaping Use: Never used  Substance and Sexual Activity   Alcohol use: No   Drug use: No   Sexual activity: Never    Birth control/protection: Surgical  Other Topics Concern   Not on file  Social History Narrative   Not on file   Social Determinants of Health   Financial Resource Strain: Low Risk  (08/04/2021)   Overall Financial Resource Strain (CARDIA)    Difficulty of Paying Living Expenses: Not very hard  Food Insecurity: No Food Insecurity (02/02/2023)   Hunger Vital Sign    Worried About Running Out of Food in the Last Year: Never true    Ran Out of Food in the Last Year: Never true  Transportation Needs: No Transportation Needs (02/02/2023)   PRAPARE - Administrator, Civil Service (Medical): No    Lack of Transportation (Non-Medical): No  Physical Activity: Not on file  Stress: Not on file  Social Connections: Not on file  Intimate Partner Violence: Not At Risk (02/02/2023)   Humiliation, Afraid, Rape, and Kick questionnaire    Fear of Current or Ex-Partner: No    Emotionally Abused: No    Physically Abused: No  Sexually Abused: No    FH:  Family History  Problem Relation Age of Onset   Stroke Father    Stroke Paternal Grandmother     Past Medical History:  Diagnosis Date   Aortic 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 s/p bioprosthetic valve replacement 2008 at [facility];  b. 01/2015 Echo: EF 60-65%, no rwma, Gr1 DD, mildly dil LA, nl RV fxn.   Aspiration pneumonia ([name])    Atrial fibrillation with RVR ([name]) 01/11/2015   Atrial flutter ([name])    a. 08/2016 s/p DCCV.  Remains on flecainide 50 mg bid.   Basal skull fracture ([name]) 20 yrs ago   CHF (congestive heart failure) ([name])     Chronic respiratory failure ([name])    COPD (chronic obstructive pulmonary disease) ([name])    a. 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 2008 prior to Aortic aneurysm repair-->nl cors.   History of stress test    a. 10/2015 MV: no ischemia/infarct.   HLD (hyperlipidemia)    HTN (hypertension)    Hypothyroidism    Obesity    PAF (paroxysmal atrial fibrillation) ([name])    a. on Eliquis; b. CHADS2VASc = 3 (HTN, age x 1, female).   Paroxysmal atrial fibrillation ([name]) 01/24/2015   Right upper quadrant abdominal tenderness without rebound tenderness 03/02/2018   S/P ascending aortic aneurysm repair 2008    Current Outpatient Medications  Medication Sig Dispense Refill   chlorpheniramine-HYDROcodone (TUSSIONEX) 10-8 MG/5ML Take 5 mLs by mouth every 12 (twelve) hours as needed. 140 mL 0   ALPRAZolam (XANAX) 0.25 MG tablet Take 1 tablet (0.25 mg total) by mouth 2 (two) times daily as needed for anxiety. 60 tablet 1   amLODipine (NORVASC) 5 MG tablet Take 5 mg by mouth in the morning and at bedtime.     atorvastatin (LIPITOR) 40 MG tablet TAKE 1 TABLET BY MOUTH EVERY NIGHT AT BEDTIME (Patient taking differently: Take 40 mg by mouth at bedtime. TAKE 1 TABLET BY MOUTH EVERY NIGHT AT BEDTIME) 90 tablet 1   azithromycin (ZITHROMAX) 250 MG tablet Take one tab a day for 10 days for uri 10 tablet 0   BREZTRI AEROSPHERE 160-9-4.8 MCG/ACT AERO INHALE 2 PUFFS INTO THE LUNGS TWICE DAILY 10.7 g 11   bumetanide (BUMEX) 1 MG tablet Take 2 tablets (2 mg total) by mouth 2 (two) times daily. And additional 2 tablets PM PRN 360 tablet 3   Calcium Carbonate-Vitamin D (CALCIUM 600+D PO) Take 1 tablet by mouth daily.     carvedilol (COREG) 25 MG tablet Take 25 mg by mouth 2 (two) times daily with a meal.     Coenzyme Q10 (COQ10) 200 MG CAPS Take 200 mg by mouth daily.     diclofenac Sodium (VOLTAREN) 1 %  GEL Apply 2 g topically 4 (four) times daily.     ELIQUIS 5 MG TABS tablet TAKE 1 TABLET BY MOUTH TWICE DAILY 180 tablet 2   ferrous sulfate 325 (65 FE) MG tablet Take 1 tablet (325 mg total) by mouth daily with breakfast.  3   fluticasone (FLONASE) 50 MCG/ACT nasal spray Place 2 sprays into both nostrils daily.  2   guaiFENesin-dextromethorphan (ROBITUSSIN DM) 100-10 MG/5ML syrup Take 5 mLs by mouth every 4 (four) hours as needed for cough. 118 mL 0  ipratropium-albuterol (DUONEB) 0.5-2.5 (3) MG/3ML SOLN Inhale 3 mLs into the lungs every 4 (four) hours as needed. Inhale 3 mls into the lungs every 4 to 6 hours and as needed 360 mL 3   JARDIANCE 10 MG TABS tablet TAKE 1 TABLET(10 MG) BY MOUTH DAILY BEFORE BREAKFAST (Patient taking differently: Take 10 mg by mouth daily.) 30 tablet 5   levothyroxine (SYNTHROID) 25 MCG tablet TAKE 1 TABLET BY MOUTH DAILY BEFORE BREAKFAST 90 tablet 1   lisinopril (ZESTRIL) 40 MG tablet TAKE 1 TABLET(40 MG) BY MOUTH DAILY (Patient taking differently: Take 40 mg by mouth daily.) 90 tablet 3   loratadine (CLARITIN) 10 MG tablet Take 10 mg by mouth daily.     methocarbamol (ROBAXIN) 500 MG tablet Take 1 tablet (500 mg total) by mouth every 6 (six) hours as needed for muscle spasms. 90 tablet 2   oxyCODONE-acetaminophen (PERCOCET) 7.5-325 MG tablet Take one tab po bid for pain severe (Patient taking differently: Take 1 tablet by mouth 2 (two) times daily as needed for severe pain. Take one tab po bid for pain severe) 60 tablet 0   pantoprazole (PROTONIX) 40 MG tablet TAKE 1 TABLET BY MOUTH EVERY DAY 90 tablet 1   potassium chloride (KLOR-CON) 10 MEQ tablet TAKE 4 TABLETS BY MOUTH TWICE DAILY 720 tablet 3   predniSONE (STERAPRED UNI-PAK 21 TAB) 10 MG (21) TBPK tablet Use as directed for 6 days 21 tablet 0   revefenacin (YUPELRI) 175 MCG/3ML nebulizer solution Take 3 mLs (175 mcg total) by nebulization daily. 90 mL 5   sertraline (ZOLOFT) 100 MG tablet TAKE 1 TABLET BY MOUTH  ONCE A DAY FOR ANXIETY (Patient taking differently: Take 100 mg by mouth daily. TAKE 1 TABLET BY MOUTH ONCE A DAY FOR ANXIETY) 90 tablet 2   sodium chloride (OCEAN) 0.65 % SOLN nasal spray Place 1 spray into both nostrils as needed for congestion.     traZODone (DESYREL) 50 MG tablet TAKE 1 TABLET BY MOUTH EVERY DAY AT BEDTIME 90 tablet 1   VENTOLIN HFA 108 (90 Base) MCG/ACT inhaler INHALE 2 PUFFS INTO THE LUNGS EVERY 6 HOURS AS NEEDED FOR WHEEZING OR SHORTNESS OF BREATH 18 g 3   No current facility-administered medications for this visit.     PHYSICAL EXAM:  General:  Well appearing. No resp difficulty HEENT: normal Neck: supple. JVP flat. Carotids 2+ bilaterally; no bruits. No lymphadenopathy or thryomegaly appreciated. Cor: PMI normal. Regular rate & rhythm. No rubs, gallops or murmurs. Lungs: clear Abdomen: soft, nontender, nondistended. No hepatosplenomegaly. No bruits or masses. Good bowel sounds. Extremities: no cyanosis, clubbing, rash, edema Neuro: alert & orientedx3, cranial nerves grossly intact. Moves all 4 extremities w/o difficulty. Affect pleasant.   ECG:   ASSESSMENT & PLAN:  1: Chronic heart failure with preserved ejection fraction with LVH- - NYHA class III - euvolemic today - weighing daily; reminded to call for an overnight weight gain of >2 pounds or a weekly weight gain of >5 pounds - weight 188.2 pounds today; she did not weigh on the scale at last visit - echo 11/04/22: EF >55%, moderate LVH Echo 07/24/22: EF of >55% along with mild LVH. Echo 11/11/21: EF of 60-65%. Echo 05/16/21: EF of 45% along with mild LVH. Echo 02/07/20: EF of >55% along with severe LAE.  - Cath 10/19/22: Reduced cardiac index at rest with PCWP 13 mmHg.  Moderate increase in PA pressure; PVR 3.9 Wu.  RA pressure 8 mm Hg. No obstructive CAD; left  dominant system. - not adding salt and is using pepper for seasoning; reviewed the importance of closely following a low sodium diet  - carvedilol  6.25mg  BID - jardiance 10mg  daily - lisinopril 40mg  daily - bumex 2mg  BID/ potassium BID - BNP 08/03/22 was 256.9  2: HTN- - BP 132/63 - saw PCP 10/30/22 - BMP 11/05/22 reviewed and showed sodium 136, potassium 4.1, creatinine 0.7 and GFR 90  3: severe COPD- - wearing oxygen at 5L around the clock at home; when she comes to appointments, she has to put her portable tank on 3L/ this tank is pulse and she doesn't like it as much as her continuous oxygen at home - saw pulmonology Welton Flakes) 07/06/22  4: Atrial fibrillation-  - dual chamber pacemaker placed June 2021 - has had multiple cardioversions - apixaban 5mg  BID - saw cardiology Mellissa Kohut) 08/12/22  5: TAVR- - s/p Wheat and hemiarch procedure with a #51mm CE Magna bovine pericardial valve on 03/30/2007 for bicuspid aortic valve syndrome  - saw cardiothoracic provider Romeo Apple) 10/19/22 - TAVR done 11/02/22  Return in 3 months, sooner if needed.

## 2023-03-03 ENCOUNTER — Encounter: Payer: Medicare Other | Admitting: Family

## 2023-03-03 ENCOUNTER — Ambulatory Visit: Payer: Medicare Other

## 2023-03-03 DIAGNOSIS — G4733 Obstructive sleep apnea (adult) (pediatric): Secondary | ICD-10-CM | POA: Diagnosis not present

## 2023-03-03 DIAGNOSIS — J449 Chronic obstructive pulmonary disease, unspecified: Secondary | ICD-10-CM | POA: Diagnosis not present

## 2023-03-04 ENCOUNTER — Other Ambulatory Visit: Payer: Self-pay

## 2023-03-04 ENCOUNTER — Telehealth: Payer: Self-pay

## 2023-03-04 ENCOUNTER — Ambulatory Visit
Admission: RE | Admit: 2023-03-04 | Discharge: 2023-03-04 | Disposition: A | Discharge status: Home / Self Care | Payer: Medicare Other | Source: Ambulatory Visit | Attending: Radiation Oncology | Admitting: Radiation Oncology

## 2023-03-04 DIAGNOSIS — Z510 Encounter for antineoplastic radiation therapy: Secondary | ICD-10-CM | POA: Insufficient documentation

## 2023-03-04 DIAGNOSIS — R911 Solitary pulmonary nodule: Secondary | ICD-10-CM | POA: Diagnosis not present

## 2023-03-04 DIAGNOSIS — C3411 Malignant neoplasm of upper lobe, right bronchus or lung: Secondary | ICD-10-CM | POA: Diagnosis not present

## 2023-03-04 LAB — RAD ONC ARIA SESSION SUMMARY
Course Elapsed Days: 42
Plan Fractions Treated to Date: 4
Plan Prescribed Dose Per Fraction: 12 Gy
Plan Total Fractions Prescribed: 5
Plan Total Prescribed Dose: 60 Gy
Reference Point Dosage Given to Date: 48 Gy
Reference Point Session Dosage Given: 12 Gy
Session Number: 4

## 2023-03-04 NOTE — Progress Notes (Signed)
PROVIDER NOTE: Information contained herein reflects review and annotations entered in association with encounter. Interpretation of such information and data should be left to medically-trained personnel. Information provided to patient can be located elsewhere in the medical record under "Patient Instructions". Document created using STT-dictation technology, any transcriptional errors that may result from process are unintentional.    Patient: [name]  Service Category: E/M  Provider: Oswaldo Done, MD  DOB: [birth year]  DOS: 03/08/2023  Referring Provider: Lyndon Code, MD  MRN: [id number]  Specialty: Interventional Pain Management  PCP: Lyndon Code, MD  Type: Established Patient  Setting: Ambulatory outpatient    Location: Office  Delivery: Face-to-face     HPI  [name], a 77 y.o. year old female, is here today because of her Chronic pain of both shoulders [M25.511, G89.29, M25.512]. 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; Shoulder impingement syndrome (Left); Chronic left shoulder pain; Chronic pain syndrome; Chronic shoulder pain (1ry area of Pain) (Bilateral) (L>R); Chronic upper extremity pain (2ry area of Pain) (Bilateral) (L>R); Cervicalgia; Chronic neck pain (3ry area of Pain) (Posterior) (Bilateral) (L>R); Shoulder blade pain (4th area of Pain) (Left); Osteoarthritis of glenohumeral joint (Left); Osteoarthritis of AC (acromioclavicular) joint (Left); Osteoarthritis of glenohumeral joints (Bilateral); Osteoarthritis of acromioclavicular joints (Bilateral); Primary osteoarthritis of shoulders (Bilateral); DDD (degenerative disc disease), cervical; Cervical radiculitis (Left); Cervical radiculopathy (Left); C6 radiculopathy (Left); C7 radiculopathy (Left); Unable to maintain body in lying position; Chronic intractable headache; DISH (diffuse idiopathic skeletal  hyperostosis); Abnormal CT scan, cervical spine (03/04/2022); Trigger point of shoulder region (Bilateral); Trigger point with neck pain; Chronic upper back pain; Chronic myofascial pain; and Musculoskeletal disorder involving upper trapezius muscle on their pertinent problem list. Pain Assessment: Severity of Chronic pain is reported as a 10-Worst pain ever/10. Location: Shoulder Left/up to back of head down left arm to elbow. Onset: 1 to 4 weeks ago (began 6 days ago). Quality: Sharp, Throbbing. Timing: Constant. Modifying factor(s): oxycodone. Vitals:  height is 4\' 10"  (1.473 m) and weight is 183 lb (83 kg). Her temporal temperature is 97.2 F (36.2 C) (abnormal). Her blood pressure is 128/76 and her pulse is 70. Her respiration is 18 and oxygen saturation is 92%.  BMI: Estimated body mass index is 38.25 kg/m as calculated from the following:   Height as of this encounter: 4\' 10"  (1.473 m).   Weight as of this encounter: 183 lb (83 kg). Last encounter: 01/07/2023. Last procedure: 12/08/2022.  Reason for encounter: evaluation of worsening, or previously known (established) problem.  The patient comes in today indicating that she is having recurrence of her left shoulder blade pain but she is also having pain over the suprascapular area, the left side of the neck, and the area over the left clavicle.  She does have a history of left-sided cervical facet disease, but unfortunately due to her COPD and other medical comorbidities, we have already learned that she is incapable of laying down on the prone position to have that treated.  For this reason, we normally bring her in and we do trigger point injections with her on the sitting position.  Today she again comes in on a wheelchair and she seems to have gained some weight and she also continues to be using her O2.  She is hard of hearing and has to read lips in order to understand what were saying.  She  was hoping that she could have those trigger point  injections today, but she admits that she completely forgot about the blood thinners and she did not stop them.  She is well aware that she needs to stop them for at least 3 days prior to the trigger point injections.  Today I have reminded her to do so and we will schedule her to return as soon as possible for those trigger points.  They usually provide her with good relief of the pain for a while.  Today she indicates that she has gone up on her oxycodone where she now takes 10 mg at a time.  She had been previously scheduled to have these injections with [place], but she canceled due to being hospitalized with congestive heart failure and a bowel infection.  Pharmacotherapy Assessment  Analgesic: No chronic opioid analgesics therapy prescribed by our practice. Oxycodone/APAP 10-325, 1 tab p.o. BID by [name], FNP-C, working for Dr. Lillia Carmel, MD. MME/day: 30 mg/day   Monitoring: [place] PMP: PDMP reviewed during this encounter.       Pharmacotherapy: No side-effects or adverse reactions reported. Compliance: No problems identified. Effectiveness: Clinically acceptable.  Andrea Mattes, RN  03/08/2023 11:55 AM  Sign when Signing Visit Safety precautions to be maintained throughout the outpatient stay will include: orient to surroundings, keep bed in low position, maintain call bell within reach at all times, provide assistance with transfer out of bed and ambulation.     No results found for: "CBDTHCR" No results found for: "D8THCCBX" No results found for: "D9THCCBX"  UDS:  Summary  Date Value Ref Range Status  04/30/2021 Note  Final    Comment:    ==================================================================== Compliance Drug Analysis, Ur ==================================================================== Test                             Result       Flag       Units  Drug Present and Declared for Prescription Verification   Alpha-hydroxyalprazolam        107           EXPECTED   ng/mg creat    Alpha-hydroxyalprazolam is an expected metabolite of alprazolam.    Source of alprazolam is a scheduled prescription medication.    Oxycodone                      1371         EXPECTED   ng/mg creat   Oxymorphone                    600          EXPECTED   ng/mg creat   Noroxycodone                   2311         EXPECTED   ng/mg creat    Sources of oxycodone include scheduled prescription medications.    Oxymorphone and noroxycodone are expected metabolites of oxycodone.    Oxymorphone is also available as a scheduled prescription medication.    Sertraline                     PRESENT      EXPECTED   Desmethylsertraline            PRESENT      EXPECTED    Desmethylsertraline is an expected  metabolite of sertraline.    Trazodone                      PRESENT      EXPECTED   1,3 chlorophenyl piperazine    PRESENT      EXPECTED    1,3-chlorophenyl piperazine is an expected metabolite of trazodone.    Acetaminophen                  PRESENT      EXPECTED  Drug Absent but Declared for Prescription Verification   Diclofenac                     Not Detected UNEXPECTED    Diclofenac, as indicated in the declared medication list, is not    always detected even when used as directed.  ==================================================================== Test                      Result    Flag   Units      Ref Range   Creatinine              45               mg/dL      >=38 ==================================================================== Declared Medications:  The flagging and interpretation on this report are based on the  following declared medications.  Unexpected results may arise from  inaccuracies in the declared medications.   **Note: The testing scope of this panel includes these medications:   Alprazolam (Xanax)  Oxycodone (Percocet)  Sertraline (Zoloft)  Trazodone (Desyrel)   **Note: The testing scope of this panel does not include small to   moderate amounts of these reported medications:   Acetaminophen (Tylenol)  Acetaminophen (Percocet)  Diclofenac (Voltaren)   **Note: The testing scope of this panel does not include the  following reported medications:   Albuterol (Ventolin HFA)  Apixaban (Eliquis)  Atorvastatin (Lipitor)  Bumetanide (Bumex)  Calcium  Flecainide (Tambocor)  Fluticasone (Trelegy)  Iron  Levothyroxine (Synthroid)  Lisinopril (Zestril)  Loratadine (Claritin)  Pantoprazole (Protonix)  Potassium (Klor-Con)  Ubiquinone (CoQ10)  Umeclidinium (Trelegy)  Vilanterol (Trelegy)  Vitamin D ==================================================================== For clinical consultation, please call [phone]. ====================================================================       ROS  Constitutional: Denies any fever or chills Gastrointestinal: No reported hemesis, hematochezia, vomiting, or acute GI distress Musculoskeletal: Denies any acute onset joint swelling, redness, loss of ROM, or weakness Neurological: No reported episodes of acute onset apraxia, aphasia, dysarthria, agnosia, amnesia, paralysis, loss of coordination, or loss of consciousness  Medication Review  ALPRAZolam, Budeson-Glycopyrrol-Formoterol, Calcium Carbonate-Vitamin D, CoQ10, albuterol, amLODipine, apixaban, atorvastatin, bumetanide, carvedilol, chlorpheniramine-HYDROcodone, diclofenac Sodium, empagliflozin, ferrous sulfate, fluticasone, ipratropium-albuterol, levothyroxine, lisinopril, loratadine, oxyCODONE-acetaminophen, pantoprazole, potassium chloride, revefenacin, sertraline, sodium chloride, and traZODone  History Review  Allergy: [name] is allergic to benadryl [diphenhydramine hcl (sleep)], cetirizine, lasix [furosemide], levaquin [levofloxacin in d5w], meloxicam, and sulfa antibiotics. Drug: [name]  reports no history of drug use. Alcohol:  reports no history of alcohol use. Tobacco:  reports that she has  quit smoking. Her smoking use included cigarettes. She has never used smokeless tobacco. Social: [name]  reports that she has quit smoking. Her smoking use included cigarettes. She has never used smokeless tobacco. She reports that she does not drink alcohol and does not use drugs. Medical:  has a past medical history of Aortic stenosis due to bicuspid aortic valve, Aspiration pneumonia ([name]), Atrial fibrillation with  RVR ([name]) (01/11/2015), Atrial flutter ([name]), Basal skull fracture ([name]) (20 yrs ago), CHF (congestive heart failure) ([name]), Chronic respiratory failure ([name]), COPD (chronic obstructive pulmonary disease) ([name]), Deafness in left ear, Essential hypertension (01/24/2015), History of cardiac cath, History of stress test, HLD (hyperlipidemia), HTN (hypertension), Hypothyroidism, Obesity, PAF (paroxysmal atrial fibrillation) ([name]), Paroxysmal atrial fibrillation ([name]) (01/24/2015), Right upper quadrant abdominal tenderness without rebound tenderness (03/02/2018), and S/P ascending aortic aneurysm repair (2008). Surgical: [name]  has a past surgical history that includes Abdominal aortic aneurysm repair (2008); Aortic valve replacement (2008); Abdominal hysterectomy; Carpal tunnel release; Tumor excision (Left); Cardiac catheterization; Cardiac catheterization (N/A, 08/17/2016); CARDIOVERSION (N/A, 08/15/2018); CARDIOVERSION (N/A, 11/14/2018); Cardioversion (N/A, 06/05/2019); Cardioversion (N/A, 08/14/2019); Cardioversion (N/A, 11/09/2019); and Cardioversion (N/A, 01/17/2020). Family: family history includes Stroke in her father and paternal grandmother.  Laboratory Chemistry Profile   Renal Lab Results  Component Value Date   BUN 14 03/06/2023   CREATININE 0.72 03/06/2023   BCR 21 03/20/2022   GFRAA >60 05/14/2020   GFRNONAA >60 03/06/2023    Hepatic Lab Results  Component Value Date   AST 20 02/01/2023   ALT 17 02/01/2023   ALBUMIN 3.7 02/01/2023   ALKPHOS 80 02/01/2023   LIPASE 30  07/10/2022    Electrolytes Lab Results  Component Value Date   NA 137 03/06/2023   K 3.7 03/06/2023   CL 101 03/06/2023   CALCIUM 9.1 03/06/2023   MG 2.4 02/03/2023   PHOS 3.5 01/30/2021    Bone Lab Results  Component Value Date   VD25OH 25.5 (L) 11/25/2020   25OHVITD1 31 04/30/2021   25OHVITD2 <1.0 04/30/2021   25OHVITD3 31 04/30/2021    Inflammation (CRP: Acute Phase) (ESR: Chronic Phase) Lab Results  Component Value Date   CRP 6 04/30/2021   ESRSEDRATE 40 04/30/2021   LATICACIDVEN 1.5 01/25/2022         Note: Above Lab results reviewed.  Recent Imaging Review  DG Chest 2 View CLINICAL DATA:  Chest pain  EXAM: CHEST - 2 VIEW  COMPARISON:  02/22/2023  FINDINGS: Post sternotomy changes and valve prosthesis. Left-sided pacing device. Cardiomegaly with vascular congestion and mild diffuse interstitial opacity likely low-grade edema. Probable trace pleural effusions. Underlying chronic interstitial disease at the bases  IMPRESSION: Cardiomegaly with vascular congestion and mild interstitial edema superimposed on underlying chronic interstitial changes at the bases.  Electronically Signed   By: Jasmine Pang M.D.   On: 03/06/2023 21:00 Note: Reviewed        Physical Exam  General appearance: Well nourished, well developed, and well hydrated. In no apparent acute distress Mental status: Alert, oriented x 3 (person, place, & time)       Respiratory: No evidence of acute respiratory distress Eyes: PERLA Vitals: BP 128/76 (BP Location: Right Arm, Patient Position: Sitting, Cuff Size: Normal)   Pulse 70   Temp (!) 97.2 F (36.2 C) (Temporal)   Resp 18   Ht 4\' 10"  (1.473 m)   Wt 183 lb (83 kg)   SpO2 92% Comment: on 5 L O2 from home  BMI 38.25 kg/m  BMI: Estimated body mass index is 38.25 kg/m as calculated from the following:   Height as of this encounter: 4\' 10"  (1.473 m).   Weight as of this encounter: 183 lb (83 kg). Ideal: Patient must be at least  60 in tall to calculate ideal body weight  Assessment   Diagnosis Status  1. Chronic shoulder pain (1ry area of Pain) (Bilateral) (L>R)  2. Chronic upper extremity pain (2ry area of Pain) (Bilateral) (L>R)   3. Chronic neck pain (3ry area of Pain) (Posterior) (Bilateral) (L>R)   4. Shoulder blade pain (4th area of Pain) (Left)   5. Trigger point of shoulder region, unspecified laterality   6. Chronic anticoagulation (Eliquis)    Controlled Controlled Controlled   Updated Problems: No problems updated.   Plan of Care  Problem-specific:  No problem-specific Assessment & Plan notes found for this encounter.  Ms. ALEESIA Howard has a current medication list which includes the following long-term medication(s): atorvastatin, bumetanide, calcium carbonate-vitamin d, carvedilol, eliquis, ferrous sulfate, fluticasone, ipratropium-albuterol, levothyroxine, lisinopril, pantoprazole, sertraline, trazodone, and ventolin hfa.  Pharmacotherapy (Medications Ordered): No orders of the defined types were placed in this encounter.  Orders:  Orders Placed This Encounter  Procedures   TRIGGER POINT INJECTION    Standing Status:   Future    Standing Expiration Date:   06/04/2023    Scheduling Instructions:     Area: Upper Back     Side: Left     Sedation: No Sedation.     Timeframe: ASAA    Order Specific Question:   Where will this procedure be performed?    Answer:   [facility]   Blood Thinner Instructions to Nursing    Always make sure patient has clearance from prescribing physician to stop blood thinners for interventional therapies. If the patient requires a Lovenox-bridge therapy, make sure arrangements are made to institute it with the assistance of the PCP.    Scheduling Instructions:     Have [name] stop the Eliquis (Apixaban) x 3 days prior to procedure or surgery.   Follow-up plan:   Return for [facility]): (L) Upper back/shoulder TPI/MNB , (Blood Thinner Protocol).       Interventional Therapies  Risk  Complexity Considerations:   Hard of hearing (HOH)  COPD  O2 dependent  positional orthopnea  Eliquis Anticoagulation: (Stop: 3 days  Restart: 6 hours)  A-fib  prosthetic AoV (Prophylactic ampicillin 2g IV)  Cardiac pacemaker     Planned  Pending:   Palliative bilateral trapezius + supraspinatus m. MNB/TPI #3    Under consideration:   Palliative bilateral trapezius + supraspinatus m. MNB/TPI #3    Completed:   Diagnostic bilateral suprascapular NB x2 (09/25/2021) (100/100/100/100 x 2.5 months)  Therapeutic bilateral Trapezius and supraspinatus MNB x2 (06/16/2022) (100/100/90/90)    Therapeutic  Palliative (PRN) options:   Therapeutic upper back and shoulder (trapezius & supraspinatus) MNB/TPI    Pharmacotherapy  Poor candidate for any medication that may induce respiratory depression.       Recent Visits Date Type Provider Dept  12/08/22 Procedure visit Delano Metz, MD [facility]  Showing recent visits within past 90 days and meeting all other requirements Today's Visits Date Type Provider Dept  03/08/23 Office Visit Delano Metz, MD [facility]  Showing today's visits and meeting all other requirements Future Appointments No visits were found meeting these conditions. Showing future appointments within next 90 days and meeting all other requirements  I discussed the assessment and treatment plan with the patient. The patient was provided an opportunity to ask questions and all were answered. The patient agreed with the plan and demonstrated an understanding of the instructions.  Patient advised to call back or seek an in-person evaluation if the symptoms or condition worsens.  Duration of encounter: 30 minutes.  Total time on encounter, as per AMA guidelines included both the face-to-face  and non-face-to-face time personally spent by the physician and/or other qualified health care  professional(s) on the day of the encounter (includes time in activities that require the physician or other qualified health care professional and does not include time in activities normally performed by clinical staff). Physician's time may include the following activities when performed: Preparing to see the patient (e.g., pre-charting review of records, searching for previously ordered imaging, lab work, and nerve conduction tests) Review of prior analgesic pharmacotherapies. Reviewing PMP Interpreting ordered tests (e.g., lab work, imaging, nerve conduction tests) Performing post-procedure evaluations, including interpretation of diagnostic procedures Obtaining and/or reviewing separately obtained history Performing a medically appropriate examination and/or evaluation Counseling and educating the patient/family/caregiver Ordering medications, tests, or procedures Referring and communicating with other health care professionals (when not separately reported) Documenting clinical information in the electronic or other health record Independently interpreting results (not separately reported) and communicating results to the patient/ family/caregiver Care coordination (not separately reported)  Note by: Oswaldo Done, MD Date: 03/08/2023; Time: 12:23 PM

## 2023-03-05 ENCOUNTER — Telehealth: Payer: Self-pay | Admitting: *Deleted

## 2023-03-05 ENCOUNTER — Ambulatory Visit: Payer: Medicare Other

## 2023-03-05 ENCOUNTER — Telehealth: Payer: Self-pay | Admitting: Pain Medicine

## 2023-03-05 ENCOUNTER — Encounter: Payer: Self-pay | Admitting: Nurse Practitioner

## 2023-03-05 ENCOUNTER — Telehealth (INDEPENDENT_AMBULATORY_CARE_PROVIDER_SITE_OTHER): Payer: Medicare Other | Admitting: Nurse Practitioner

## 2023-03-05 VITALS — Resp 16 | Ht <= 58 in

## 2023-03-05 DIAGNOSIS — M25511 Pain in right shoulder: Secondary | ICD-10-CM

## 2023-03-05 DIAGNOSIS — C3411 Malignant neoplasm of upper lobe, right bronchus or lung: Secondary | ICD-10-CM

## 2023-03-05 DIAGNOSIS — G8929 Other chronic pain: Secondary | ICD-10-CM | POA: Diagnosis not present

## 2023-03-05 MED ORDER — OXYCODONE-ACETAMINOPHEN 10-325 MG PO TABS
1.0000 | ORAL_TABLET | Freq: Two times a day (BID) | ORAL | 0 refills | Status: DC | PRN
Start: 2023-03-05 — End: 2023-04-05

## 2023-03-05 NOTE — Telephone Encounter (Signed)
PT called states that she is having pain in her neck. PT has an appt schedule for Monday at 1pm. However patient states that she is taking her medications and it's not helping the pain. PT ask if Dr. Laban Emperor could send some other medications in for her to take. I explain to patient that Dr.Naveira will not be back until Monday. PT states that it's ridiculous that she has to deal with this pain all weekend, something needs to be done. Please give patient a call. TY

## 2023-03-05 NOTE — Telephone Encounter (Signed)
Returned patient phone call re; neck pain that is radiating into her shoulder.  She does have oxycodone - apap 7.5-325 mg that she can take bid.  I told her that FN is not here today and would not call in medication over the phone.  She has an appt on Monday.  Advised topical analgesics, heat/ice and taking tylenol in between her dosing of the oxycodone - apap 7.5-325 mg.  We did discuss seeking medical attention from ED, urgent care of her PCP.  Patient verbalizes u/o information provided.

## 2023-03-05 NOTE — Progress Notes (Signed)
[facility] [address] [place], [place] [address]  Internal MEDICINE  Telephone Visit  Patient Name: [name]  [id number]  [id number]  Date of Service: 03/05/2023  I connected with the patient at 1200 by telephone and verified the patients identity using two identifiers.   I discussed the limitations, risks, security and privacy concerns of performing an evaluation and management service by telephone and the availability of in person appointments. I also discussed with the patient that there may be a patient responsible charge related to the service.  The patient expressed understanding and agrees to proceed.    Chief Complaint  Patient presents with   Telephone Screen    Shoulder pain collar bone pain.   Telephone Assessment    HPI [name] presents for a telehealth virtual visit for right shoulder pain severe after 2 chemo treatments this week.  Taking percocet 7.5-325 mg prn but reports that this is not helping her pain. She cannot see her pain management specialist for several days.    Current Medication: Outpatient Encounter Medications as of 03/05/2023  Medication Sig   ALPRAZolam (XANAX) 0.25 MG tablet Take 1 tablet (0.25 mg total) by mouth 2 (two) times daily as needed for anxiety.   amLODipine (NORVASC) 5 MG tablet Take 5 mg by mouth in the morning and at bedtime.   atorvastatin (LIPITOR) 40 MG tablet TAKE 1 TABLET BY MOUTH EVERY NIGHT AT BEDTIME (Patient taking differently: Take 40 mg by mouth at bedtime. TAKE 1 TABLET BY MOUTH EVERY NIGHT AT BEDTIME)   azithromycin (ZITHROMAX) 250 MG tablet Take one tab a day for 10 days for uri   BREZTRI AEROSPHERE 160-9-4.8 MCG/ACT AERO INHALE 2 PUFFS INTO THE LUNGS TWICE DAILY   bumetanide (BUMEX) 1 MG tablet Take 2 tablets (2 mg total) by mouth 2 (two) times daily. And additional 2 tablets PM PRN   Calcium Carbonate-Vitamin D (CALCIUM 600+D PO) Take 1 tablet by mouth daily.   carvedilol (COREG) 25 MG tablet Take 25 mg  by mouth 2 (two) times daily with a meal.   chlorpheniramine-HYDROcodone (TUSSIONEX) 10-8 MG/5ML Take 5 mLs by mouth every 12 (twelve) hours as needed.   Coenzyme Q10 (COQ10) 200 MG CAPS Take 200 mg by mouth daily.   diclofenac Sodium (VOLTAREN) 1 % GEL Apply 2 g topically 4 (four) times daily.   ELIQUIS 5 MG TABS tablet TAKE 1 TABLET BY MOUTH TWICE DAILY   ferrous sulfate 325 (65 FE) MG tablet Take 1 tablet (325 mg total) by mouth daily with breakfast.   fluticasone (FLONASE) 50 MCG/ACT nasal spray Place 2 sprays into both nostrils daily.   guaiFENesin-dextromethorphan (ROBITUSSIN DM) 100-10 MG/5ML syrup Take 5 mLs by mouth every 4 (four) hours as needed for cough.   ipratropium-albuterol (DUONEB) 0.5-2.5 (3) MG/3ML SOLN Inhale 3 mLs into the lungs every 4 (four) hours as needed. Inhale 3 mls into the lungs every 4 to 6 hours and as needed   JARDIANCE 10 MG TABS tablet TAKE 1 TABLET(10 MG) BY MOUTH DAILY BEFORE BREAKFAST (Patient taking differently: Take 10 mg by mouth daily.)   levothyroxine (SYNTHROID) 25 MCG tablet TAKE 1 TABLET BY MOUTH DAILY BEFORE BREAKFAST   lisinopril (ZESTRIL) 40 MG tablet TAKE 1 TABLET(40 MG) BY MOUTH DAILY (Patient taking differently: Take 40 mg by mouth daily.)   loratadine (CLARITIN) 10 MG tablet Take 10 mg by mouth daily.   methocarbamol (ROBAXIN) 500 MG tablet Take 1 tablet (500 mg total) by mouth every 6 (  six) hours as needed for muscle spasms.   oxyCODONE-acetaminophen (PERCOCET) 10-325 MG tablet Take 1 tablet by mouth 2 (two) times daily as needed for pain.   pantoprazole (PROTONIX) 40 MG tablet TAKE 1 TABLET BY MOUTH EVERY DAY   potassium chloride (KLOR-CON) 10 MEQ tablet TAKE 4 TABLETS BY MOUTH TWICE DAILY   predniSONE (STERAPRED UNI-PAK 21 TAB) 10 MG (21) TBPK tablet Use as directed for 6 days   revefenacin (YUPELRI) 175 MCG/3ML nebulizer solution Take 3 mLs (175 mcg total) by nebulization daily.   sertraline (ZOLOFT) 100 MG tablet TAKE 1 TABLET BY MOUTH ONCE  A DAY FOR ANXIETY (Patient taking differently: Take 100 mg by mouth daily. TAKE 1 TABLET BY MOUTH ONCE A DAY FOR ANXIETY)   sodium chloride (OCEAN) 0.65 % SOLN nasal spray Place 1 spray into both nostrils as needed for congestion.   traZODone (DESYREL) 50 MG tablet TAKE 1 TABLET BY MOUTH EVERY DAY AT BEDTIME   VENTOLIN HFA 108 (90 Base) MCG/ACT inhaler INHALE 2 PUFFS INTO THE LUNGS EVERY 6 HOURS AS NEEDED FOR WHEEZING OR SHORTNESS OF BREATH   [DISCONTINUED] oxyCODONE-acetaminophen (PERCOCET) 7.5-325 MG tablet Take one tab po bid for pain severe (Patient taking differently: Take 1 tablet by mouth 2 (two) times daily as needed for severe pain. Take one tab po bid for pain severe)   No facility-administered encounter medications on file as of 03/05/2023.    Surgical History: Past Surgical History:  Procedure Laterality Date   ABDOMINAL AORTIC ANEURYSM REPAIR  2008   ABDOMINAL HYSTERECTOMY     AORTIC VALVE REPLACEMENT  2008   CARDIAC CATHETERIZATION     [facility]   CARDIOVERSION N/A 08/15/2018   Procedure: CARDIOVERSION;  Surgeon: Iran Ouch, MD;  Location: [facility] ORS;  Service: Cardiovascular;  Laterality: N/A;   CARDIOVERSION N/A 11/14/2018   Procedure: CARDIOVERSION (CATH LAB);  Surgeon: Iran Ouch, MD;  Location: [facility] ORS;  Service: Cardiovascular;  Laterality: N/A;   CARDIOVERSION N/A 06/05/2019   Procedure: CARDIOVERSION;  Surgeon: Iran Ouch, MD;  Location: [facility] ORS;  Service: Cardiovascular;  Laterality: N/A;   CARDIOVERSION N/A 08/14/2019   Procedure: CARDIOVERSION;  Surgeon: Iran Ouch, MD;  Location: [facility] ORS;  Service: Cardiovascular;  Laterality: N/A;   CARDIOVERSION N/A 11/09/2019   Procedure: CARDIOVERSION;  Surgeon: Marcina Millard, MD;  Location: [facility] ORS;  Service: Cardiovascular;  Laterality: N/A;   CARDIOVERSION N/A 01/17/2020   Procedure: CARDIOVERSION;  Surgeon: Marcina Millard, MD;  Location: [facility] ORS;  Service: Cardiovascular;  Laterality: N/A;    CARPAL TUNNEL RELEASE     ELECTROPHYSIOLOGIC STUDY N/A 08/17/2016   Procedure: Cardioversion;  Surgeon: Iran Ouch, MD;  Location: [facility] ORS;  Service: Cardiovascular;  Laterality: N/A;   TUMOR EXCISION Left    x3 (arm)    Medical History: Past Medical History:  Diagnosis Date   Aortic 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 s/p bioprosthetic valve replacement 2008 at [facility];  b. 01/2015 Echo: EF 60-65%, no rwma, Gr1 DD, mildly dil LA, nl RV fxn.   Aspiration pneumonia ([name])    Atrial fibrillation with RVR ([name]) 01/11/2015   Atrial flutter ([name])    a. 08/2016 s/p DCCV.  Remains on flecainide 50 mg bid.   Basal skull fracture ([name]) 20 yrs ago   CHF (congestive heart failure) ([name])    Chronic respiratory failure ([name])    COPD (chronic obstructive pulmonary disease) ([name])    a. 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 2008 prior to Aortic aneurysm repair-->nl cors.   History of stress test    a. 10/2015 MV: no ischemia/infarct.   HLD (hyperlipidemia)    HTN (hypertension)    Hypothyroidism    Obesity    PAF (paroxysmal atrial fibrillation) ([name])    a. on Eliquis; b. CHADS2VASc = 3 (HTN, age x 1, female).   Paroxysmal atrial fibrillation ([name]) 01/24/2015   Right upper quadrant abdominal tenderness without rebound tenderness 03/02/2018   S/P ascending aortic aneurysm repair 2008    Family History: Family History  Problem Relation Age of Onset   Stroke Father    Stroke Paternal Grandmother     Social History   Socioeconomic History   Marital status: Single    Spouse name: Not on file   Number of children: Not on file   Years of education: Not on file   Highest education level: Not on file  Occupational History   Not on file  Tobacco Use   Smoking status: Former    Types: Cigarettes   Smokeless tobacco: Never  Vaping Use   Vaping Use: Never used   Substance and Sexual Activity   Alcohol use: No   Drug use: No   Sexual activity: Never    Birth control/protection: Surgical  Other Topics Concern   Not on file  Social History Narrative   Not on file   Social Determinants of Health   Financial Resource Strain: Low Risk  (08/04/2021)   Overall Financial Resource Strain (CARDIA)    Difficulty of Paying Living Expenses: Not very hard  Food Insecurity: No Food Insecurity (02/02/2023)   Hunger Vital Sign    Worried About Running Out of Food in the Last Year: Never true    Ran Out of Food in the Last Year: Never true  Transportation Needs: No Transportation Needs (02/02/2023)   PRAPARE - Administrator, Civil Service (Medical): No    Lack of Transportation (Non-Medical): No  Physical Activity: Not on file  Stress: Not on file  Social Connections: Not on file  Intimate Partner Violence: Not At Risk (02/02/2023)   Humiliation, Afraid, Rape, and Kick questionnaire    Fear of Current or Ex-Partner: No    Emotionally Abused: No    Physically Abused: No    Sexually Abused: No      Review of Systems  Constitutional:  Positive for fatigue.  HENT: Negative.    Respiratory:  Positive for cough, chest tightness, shortness of breath and wheezing.        Baseline intermittent symptoms   Cardiovascular: Negative.  Negative for chest pain and palpitations.  Gastrointestinal:  Positive for nausea.  Musculoskeletal:  Positive for arthralgias (esp right shoulder).    Vital Signs: Resp 16   Ht 4\' 10"  (1.473 m)   BMI 38.67 kg/m    Observation/Objective: She is alert and oriented and engages in conversation appropriately. No acute distress noted.     Assessment/Plan: 1. Chronic right shoulder pain Percocet dose temporarily increased for now. Will reevaluate dose at a future office visit  - oxyCODONE-acetaminophen (PERCOCET) 10-325 MG tablet; Take 1 tablet by mouth 2 (two) times daily as needed for pain.  Dispense: 60  tablet; Refill: 0  2. Malignant neoplasm of right upper lobe of lung ([name]) Has started getting  treatments, pain is increased, pain medication dose increased temporarily.    General Counseling: Sincerity verbalizes understanding of the findings of today's phone visit and agrees with plan of treatment. I have discussed any further diagnostic evaluation that may be needed or ordered today. We also reviewed her medications today. she has been encouraged to call the office with any questions or concerns that should arise related to todays visit.  Return if symptoms worsen or fail to improve.   No orders of the defined types were placed in this encounter.   Meds ordered this encounter  Medications   oxyCODONE-acetaminophen (PERCOCET) 10-325 MG tablet    Sig: Take 1 tablet by mouth 2 (two) times daily as needed for pain.    Dispense:  60 tablet    Refill:  0    Not an initial script, this is an increase in the percocet dose.    Time spent:10 Minutes Time spent with patient included reviewing progress notes, labs, imaging studies, and discussing plan for follow up.  [place] Controlled Substance Database was reviewed by me for overdose risk score (ORS) if appropriate.  This patient was seen by Sallyanne Kuster, FNP-C in collaboration with Dr. Beverely Risen as a part of collaborative care agreement.  Yuleni Burich R. Tedd Sias, MSN, FNP-C Internal medicine

## 2023-03-05 NOTE — Telephone Encounter (Signed)
Pt had appt today.

## 2023-03-06 ENCOUNTER — Emergency Department
Admission: EM | Admit: 2023-03-06 | Discharge: 2023-03-06 | Disposition: A | Discharge status: Home / Self Care | Payer: Medicare Other | Attending: Emergency Medicine | Admitting: Emergency Medicine

## 2023-03-06 ENCOUNTER — Emergency Department: Payer: Medicare Other

## 2023-03-06 ENCOUNTER — Other Ambulatory Visit: Payer: Self-pay

## 2023-03-06 ENCOUNTER — Ambulatory Visit: Payer: Medicare Other

## 2023-03-06 DIAGNOSIS — J810 Acute pulmonary edema: Secondary | ICD-10-CM | POA: Diagnosis not present

## 2023-03-06 DIAGNOSIS — Z9981 Dependence on supplemental oxygen: Secondary | ICD-10-CM | POA: Diagnosis not present

## 2023-03-06 DIAGNOSIS — R079 Chest pain, unspecified: Secondary | ICD-10-CM | POA: Diagnosis not present

## 2023-03-06 DIAGNOSIS — I10 Essential (primary) hypertension: Secondary | ICD-10-CM | POA: Diagnosis not present

## 2023-03-06 DIAGNOSIS — J449 Chronic obstructive pulmonary disease, unspecified: Secondary | ICD-10-CM | POA: Diagnosis not present

## 2023-03-06 DIAGNOSIS — I110 Hypertensive heart disease with heart failure: Secondary | ICD-10-CM | POA: Diagnosis not present

## 2023-03-06 DIAGNOSIS — R0989 Other specified symptoms and signs involving the circulatory and respiratory systems: Secondary | ICD-10-CM | POA: Diagnosis not present

## 2023-03-06 DIAGNOSIS — I5030 Unspecified diastolic (congestive) heart failure: Secondary | ICD-10-CM | POA: Insufficient documentation

## 2023-03-06 DIAGNOSIS — M542 Cervicalgia: Secondary | ICD-10-CM | POA: Diagnosis not present

## 2023-03-06 DIAGNOSIS — Z85118 Personal history of other malignant neoplasm of bronchus and lung: Secondary | ICD-10-CM | POA: Diagnosis not present

## 2023-03-06 DIAGNOSIS — J811 Chronic pulmonary edema: Secondary | ICD-10-CM | POA: Diagnosis not present

## 2023-03-06 LAB — BASIC METABOLIC PANEL
Anion gap: 10 (ref 5–15)
BUN: 14 mg/dL (ref 8–23)
CO2: 26 mmol/L (ref 22–32)
Calcium: 9.1 mg/dL (ref 8.9–10.3)
Chloride: 101 mmol/L (ref 98–111)
Creatinine, Ser: 0.72 mg/dL (ref 0.44–1.00)
GFR, Estimated: >60 mL/min (ref >60)
Glucose, Bld: 119 mg/dL — ABNORMAL HIGH (ref 70–99)
Potassium: 3.7 mmol/L (ref 3.5–5.1)
Sodium: 137 mmol/L (ref 135–145)

## 2023-03-06 LAB — TROPONIN I (HIGH SENSITIVITY): Troponin I (High Sensitivity): 13 ng/L (ref <18)

## 2023-03-06 MED ORDER — LIDOCAINE 5 % EX PTCH
1.0000 | MEDICATED_PATCH | CUTANEOUS | Status: DC
  Administered 2023-03-06: 1 via TRANSDERMAL
  Filled 2023-03-06: qty 1

## 2023-03-06 MED ORDER — OXYCODONE-ACETAMINOPHEN 5-325 MG PO TABS
1.0000 | ORAL_TABLET | Freq: Once | ORAL | Status: AC
  Administered 2023-03-06: 1 via ORAL
  Filled 2023-03-06: qty 1

## 2023-03-06 NOTE — ED Notes (Signed)
Attempted to collect labs, pt refused.

## 2023-03-06 NOTE — ED Notes (Signed)
Pt discharged with daughter on home oxygen tank.

## 2023-03-06 NOTE — ED Notes (Signed)
Pt brought back from main lobby via wheelchair. Has refused to move onto the stretcher. Was connected to VS monitor while on the chair. Call bell within reach.   On assessment pt c/o left shoulder pain that radiates into the neck that started around 3 days ago. Describes it as feeling like a "knot." Area tender to touch. No injury/trauma. Has hx chronic pain in area. Took prescribed 10 mg oxy and attempted warm/cold compresses without relief. Endorses chronic SOB. Has labored breathing on assessment. Sts this time normal for her.

## 2023-03-06 NOTE — ED Notes (Signed)
Patient transported to X-ray 

## 2023-03-06 NOTE — ED Notes (Signed)
Pt refused to get up in bed with this EDT's help and also refused when another NT came to help as well. Pt informed EDT that she "will not be getting out of this chair." Pt connected to BP and SpO2 for monitoring. Call bell tied to arm rest of chair and pt informed on its proper use. Pt also connected to O2 on wall in room due to chronic 5L [place]. RN made aware of pts decision to remain in chair.

## 2023-03-06 NOTE — ED Provider Notes (Signed)
[facility] Provider Note    Event Date/Time   First MD Initiated Contact with Patient 03/06/23 2002     (approximate)   History   Chief Complaint Neck Pain (Left started 3 days ago)   HPI  [name] is a 77 y.o. female with past medical history of hypertension, hyperlipidemia, COPD, chronic hypoxic respiratory failure on 5 L nasal cannula, atrial fibrillation, lung cancer, aortic valve replacement, and ascending aortic aneurysm status postrepair who presents to the ED complaining of neck pain.  Patient reports that she has been dealing with constant pain over the left side of her neck and left shoulder, and extending into her left upper chest.  She denies any trauma to this area, states it has been very sore to touch and it is painful for her to move her left arm.  She has dealt with similar pain previously, but states that has never been this severe.  She follows with pain management and has been taking oxycodone without significant relief.  She denies any difficulty breathing beyond her baseline.     Physical Exam   Triage Vital Signs: ED Triage Vitals  Enc Vitals Group     BP 03/06/23 1954 (!) 160/85     Pulse Rate 03/06/23 1954 74     Resp 03/06/23 1954 16     Temp 03/06/23 1954 98 F (36.7 C)     Temp Source 03/06/23 1954 Oral     SpO2 03/06/23 1954 95 %     Weight 03/06/23 1955 182 lb 15.7 oz (83 kg)     Height 03/06/23 1955 4\' 10"  (1.473 m)     Head Circumference --      Peak Flow --      Pain Score 03/06/23 1954 10     Pain Loc --      Pain Edu? --      Excl. in GC? --     Most recent vital signs: Vitals:   03/06/23 2156 03/06/23 2200  BP:  120/87  Pulse: 68 70  Resp: 20 19  Temp:    SpO2:  96%    Constitutional: Alert and oriented. Eyes: Conjunctivae are normal. Head: Atraumatic. Nose: No congestion/rhinnorhea. Mouth/Throat: Mucous membranes are moist.  Neck: Left lateral neck tenderness to palpation without associated  erythema, edema, or warmth.  No midline cervical spine tenderness to palpation. Cardiovascular: Normal rate, regular rhythm. Grossly normal heart sounds.  2+ radial pulses bilaterally. Respiratory: Normal respiratory effort.  No retractions. Lungs CTAB.  Tenderness to palpation over left upper chest wall. Gastrointestinal: Soft and nontender. No distention. Musculoskeletal: No lower extremity tenderness nor edema.  Tenderness to palpation noted over left trapezius. Neurologic:  Normal speech and language. No gross focal neurologic deficits are appreciated.    ED Results / Procedures / Treatments   Labs (all labs ordered are listed, but only abnormal results are displayed) Labs Reviewed  BASIC METABOLIC PANEL - Abnormal; Notable for the following components:      Result Value   Glucose, Bld 119 (*)    All other components within normal limits  CBC WITH DIFFERENTIAL/PLATELET  CBC WITH DIFFERENTIAL/PLATELET  TROPONIN I (HIGH SENSITIVITY)     EKG  ED ECG REPORT I, Chesley Noon, the attending physician, personally viewed and interpreted this ECG.   Date: 03/06/2023  EKG Time: 19:56  Rate: 76  Rhythm: AV dual paced rhythm  Axis: Normal  Intervals:nonspecific intraventricular conduction delay  ST&T Change: None  RADIOLOGY  Chest x-ray reviewed and interpreted by me with mild edema, no focal infiltrate or effusion noted.  PROCEDURES:  Critical Care performed: No  Procedures   MEDICATIONS ORDERED IN ED: Medications  lidocaine (LIDODERM) 5 % 1 patch (1 patch Transdermal Patch Applied 03/06/23 2057)  oxyCODONE-acetaminophen (PERCOCET/ROXICET) 5-325 MG per tablet 1 tablet (1 tablet Oral Given 03/06/23 2056)     IMPRESSION / MDM / ASSESSMENT AND PLAN / ED COURSE  I reviewed the triage vital signs and the nursing notes.                              77 y.o. female with past medical history of hypertension, hyperlipidemia, COPD, chronic hypoxic respiratory failure on 5 L,  atrial fibrillation, lung cancer, diastolic CHF, aortic valve replacement, and ascending aortic aneurysm status postrepair who presents to the ED with 3 days of constant pain in her left neck, shoulder, and upper chest.  Patient's presentation is most consistent with acute presentation with potential threat to life or bodily function.  Differential diagnosis includes, but is not limited to, ACS, PE, pneumonia, pneumothorax, musculoskeletal pain, chronic pain syndrome, cervical radiculopathy.  Patient nontoxic-appearing and in no acute distress, vital signs are unremarkable.  EKG shows AV paced rhythm and interpretation limited by this, but symptoms seem very atypical for ACS.  Pain is reproduced with palpation over her trapezius and is worse with movement of her left arm.  She remains neurovascular intact to her left upper extremity with no limitation in strength and strong radial pulse.  We will treat symptomatically with Lidoderm patch and her typical evening dose of oxycodone.  Plan to screen for ACS with troponin, also check chest x-ray.  If these are unremarkable, suspect acute on chronic cervical radiculopathy versus trapezius strain.  Labs are reassuring with no acute electrolyte abnormality or AKI, troponin within normal limits and I doubt cardiac etiology for her pain given constant pain for 3 days.  Patient requires redraw of CBC, but is currently refusing additional blood draw and requesting to be discharged home.  Her chest x-ray does show mild pulmonary edema, however patient denies any difficulty breathing at this time.  I spoke with her about staying for dose of Lasix, but she also declines this, states she has follow-up in the heart failure clinic in 2 days.  She also states that she has follow-up with her pain management provider in 2 days.  She was counseled to continue her regular pain regimen and to return to the ED for any new or worsening symptoms, patient agrees with plan.       FINAL CLINICAL IMPRESSION(S) / ED DIAGNOSES   Final diagnoses:  Neck pain  Acute pulmonary edema ([name])     Rx / DC Orders   ED Discharge Orders     None        Note:  This document was prepared using Dragon voice recognition software and may include unintentional dictation errors.   Chesley Noon, MD 03/06/23 2214

## 2023-03-06 NOTE — ED Notes (Signed)
Re-attempted collection on CBC. Pt refused once again. Sts she's doesn't want any additional blood drawn and is ready to go home.

## 2023-03-06 NOTE — ED Triage Notes (Addendum)
Pt to ed from home via POV for neck pain in the left side of her neck that moves into her arm that started Wednesday pt denies any trauma. Pot is SOB from walking across the parking lot and it being very hot outside. Pt wears 5 liters chronic at home. Pt has taken her prescribed narcs with no relief. Pt was unable to go to the pain clinic to be seen due to them being closed. Pt has chronic pain. Pt called her PCP yesterday and they increased her percocet dose. Pt has no new complaints other than chronic pain.

## 2023-03-08 ENCOUNTER — Encounter: Payer: Self-pay | Admitting: Pain Medicine

## 2023-03-08 ENCOUNTER — Encounter: Payer: Self-pay | Admitting: Family

## 2023-03-08 ENCOUNTER — Ambulatory Visit (HOSPITAL_BASED_OUTPATIENT_CLINIC_OR_DEPARTMENT_OTHER): Payer: Medicare Other | Admitting: Pain Medicine

## 2023-03-08 ENCOUNTER — Ambulatory Visit: Payer: Medicare Other | Attending: Family | Admitting: Family

## 2023-03-08 VITALS — BP 128/76 | HR 70 | Temp 97.2°F | Resp 18 | Ht <= 58 in | Wt 183.0 lb

## 2023-03-08 VITALS — BP 108/47 | HR 71 | Resp 16

## 2023-03-08 DIAGNOSIS — M542 Cervicalgia: Secondary | ICD-10-CM

## 2023-03-08 DIAGNOSIS — Z9981 Dependence on supplemental oxygen: Secondary | ICD-10-CM

## 2023-03-08 DIAGNOSIS — Z823 Family history of stroke: Secondary | ICD-10-CM | POA: Insufficient documentation

## 2023-03-08 DIAGNOSIS — I10 Essential (primary) hypertension: Secondary | ICD-10-CM | POA: Diagnosis not present

## 2023-03-08 DIAGNOSIS — Z950 Presence of cardiac pacemaker: Secondary | ICD-10-CM | POA: Insufficient documentation

## 2023-03-08 DIAGNOSIS — Z8673 Personal history of transient ischemic attack (TIA), and cerebral infarction without residual deficits: Secondary | ICD-10-CM | POA: Diagnosis not present

## 2023-03-08 DIAGNOSIS — I480 Paroxysmal atrial fibrillation: Secondary | ICD-10-CM | POA: Diagnosis not present

## 2023-03-08 DIAGNOSIS — Z79899 Other long term (current) drug therapy: Secondary | ICD-10-CM | POA: Insufficient documentation

## 2023-03-08 DIAGNOSIS — G8929 Other chronic pain: Secondary | ICD-10-CM

## 2023-03-08 DIAGNOSIS — Z7901 Long term (current) use of anticoagulants: Secondary | ICD-10-CM | POA: Insufficient documentation

## 2023-03-08 DIAGNOSIS — I110 Hypertensive heart disease with heart failure: Secondary | ICD-10-CM | POA: Diagnosis not present

## 2023-03-08 DIAGNOSIS — Z87891 Personal history of nicotine dependence: Secondary | ICD-10-CM | POA: Diagnosis not present

## 2023-03-08 DIAGNOSIS — M79602 Pain in left arm: Secondary | ICD-10-CM | POA: Diagnosis not present

## 2023-03-08 DIAGNOSIS — I5032 Chronic diastolic (congestive) heart failure: Secondary | ICD-10-CM

## 2023-03-08 DIAGNOSIS — M25511 Pain in right shoulder: Secondary | ICD-10-CM

## 2023-03-08 DIAGNOSIS — M25512 Pain in left shoulder: Secondary | ICD-10-CM | POA: Diagnosis not present

## 2023-03-08 DIAGNOSIS — M79601 Pain in right arm: Secondary | ICD-10-CM

## 2023-03-08 DIAGNOSIS — J449 Chronic obstructive pulmonary disease, unspecified: Secondary | ICD-10-CM

## 2023-03-08 DIAGNOSIS — Z953 Presence of xenogenic heart valve: Secondary | ICD-10-CM | POA: Diagnosis not present

## 2023-03-08 DIAGNOSIS — Z952 Presence of prosthetic heart valve: Secondary | ICD-10-CM

## 2023-03-08 DIAGNOSIS — M898X1 Other specified disorders of bone, shoulder: Secondary | ICD-10-CM

## 2023-03-08 DIAGNOSIS — M25519 Pain in unspecified shoulder: Secondary | ICD-10-CM

## 2023-03-08 DIAGNOSIS — Z993 Dependence on wheelchair: Secondary | ICD-10-CM

## 2023-03-08 NOTE — Progress Notes (Signed)
Safety precautions to be maintained throughout the outpatient stay will include: orient to surroundings, keep bed in low position, maintain call bell within reach at all times, provide assistance with transfer out of bed and ambulation.  

## 2023-03-08 NOTE — Progress Notes (Signed)
PCP: Beverely Risen, MD (last seen 06/24) Primary Cardiologist: Marcina Millard, MD (last seen 04/24)  HPI:  [name] is a 77 y/o female with a history of hyperlipidemia, HTN, thyroid disease, lung cancer, severe COPD, left ear deafness, atrial fibrillation, aortic stenosis s/p aortic valve replacement with bioprosthetic valve in 2008, s/p TAVR on 11/03/2022 at [facility], ascending aortic aneurysm s/p repair, history of AV node ablation and dual-chamber pacemaker placement in June 2021, stroke, lung cancer, anxiety, chronic anemia, previous tobacco use and chronic heart failure.   Echo 11/04/22: EF >55%, moderate LVH Echo 07/24/22: EF of >55% along with mild LVH. Echo 11/11/21: EF of 60-65%. Echo 05/16/21: EF of 45% along with mild LVH. Echo 02/07/20: EF of >55% along with severe LAE.   Cath 10/19/22: Reduced cardiac index at rest with PCWP 13 mmHg.  Moderate increase in PA pressure; PVR 3.9 Wu.  RA pressure 8 mm Hg. No obstructive CAD; left dominant system.   Was in the ED 03/06/23 due to neck pain. Admitted 02/01/23 due to HF exacerbation. Given IV bumex. Admitted 11/02/22 due to TAVR. Admitted 08/04/22 due to worsening shortness of breath with exertion from her baseline associated with increased abdominal girth and orthopnea for about 4 days. Initially given IV lasix with transition to oral diuretics. Cardiology consult obtained. Discharged after 3 days. Was in the ED 07/10/22 due to upper abdominal pain. Serum labs are all normal. Chest x-ray and right upper quadrant ultrasound also unremarkable.  She has known cholelithiasis which is again demonstrated today.    She presents today for a HF follow-up visit with a chief complaint of severe left sided neck pain radiating to shoulder and also up neck. Has associated moderate SOB, fatigue, cough, dizziness, waking up early due to neck/ shoulder pain along with this. Denies chest pain, palpitations or weight gain. Her biggest complaint is of this severe neck  pain that started ~ 5 days ago. She has an appointment later today at the pain center and is hoping to get some relief.   Oxygen is at 5L although her portable tank is a pulse tank and only goes to 3L. Oxygen drops to 84%  Currenlty getting radiation.   Not adding salt to her food but doesn't cook so eats a lot of frozen, easily fixed foods such as chicken nuggets, lean cuisine meals, pizza or hushpuppies. Does rinse off canned vegetables before preparing them. She is unsure of her fluid intake but thinks that it's "not much".   ROS: All systems negative except as listed in HPI, PMH and Problem List.  SH:  Social History   Socioeconomic History   Marital status: Divorced    Spouse name: Not on file   Number of children: Not on file   Years of education: Not on file   Highest education level: Not on file  Occupational History   Not on file  Tobacco Use   Smoking status: Former    Types: Cigarettes   Smokeless tobacco: Never  Vaping Use   Vaping Use: Never used  Substance and Sexual Activity   Alcohol use: No   Drug use: No   Sexual activity: Never    Birth control/protection: Surgical  Other Topics Concern   Not on file  Social History Narrative   Not on file   Social Determinants of Health   Financial Resource Strain: Low Risk  (08/04/2021)   Overall Financial Resource Strain (CARDIA)    Difficulty of Paying Living Expenses: Not very hard  Food Insecurity: No Food Insecurity (02/02/2023)   Hunger Vital Sign    Worried About Running Out of Food in the Last Year: Never true    Ran Out of Food in the Last Year: Never true  Transportation Needs: No Transportation Needs (02/02/2023)   PRAPARE - Administrator, Civil Service (Medical): No    Lack of Transportation (Non-Medical): No  Physical Activity: Not on file  Stress: Not on file  Social Connections: Not on file  Intimate Partner Violence: Not At Risk (02/02/2023)   Humiliation, Afraid, Rape, and Kick  questionnaire    Fear of Current or Ex-Partner: No    Emotionally Abused: No    Physically Abused: No    Sexually Abused: No    FH:  Family History  Problem Relation Age of Onset   Stroke Father    Stroke Paternal Grandmother     Past Medical History:  Diagnosis Date   Aortic 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 s/p bioprosthetic valve replacement 2008 at [facility];  b. 01/2015 Echo: EF 60-65%, no rwma, Gr1 DD, mildly dil LA, nl RV fxn.   Aspiration pneumonia ([name])    Atrial fibrillation with RVR ([name]) 01/11/2015   Atrial flutter ([name])    a. 08/2016 s/p DCCV.  Remains on flecainide 50 mg bid.   Basal skull fracture ([name]) 20 yrs ago   CHF (congestive heart failure) ([name])    Chronic respiratory failure ([name])    COPD (chronic obstructive pulmonary disease) ([name])    a. 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 2008 prior to Aortic aneurysm repair-->nl cors.   History of stress test    a. 10/2015 MV: no ischemia/infarct.   HLD (hyperlipidemia)    HTN (hypertension)    Hypothyroidism    Obesity    PAF (paroxysmal atrial fibrillation) ([name])    a. on Eliquis; b. CHADS2VASc = 3 (HTN, age x 1, female).   Paroxysmal atrial fibrillation ([name]) 01/24/2015   Right upper quadrant abdominal tenderness without rebound tenderness 03/02/2018   S/P ascending aortic aneurysm repair 2008    Current Outpatient Medications  Medication Sig Dispense Refill   ALPRAZolam (XANAX) 0.25 MG tablet Take 1 tablet (0.25 mg total) by mouth 2 (two) times daily as needed for anxiety. 60 tablet 1   amLODipine (NORVASC) 5 MG tablet Take 5 mg by mouth in the morning and at bedtime.     atorvastatin (LIPITOR) 40 MG tablet TAKE 1 TABLET BY MOUTH EVERY NIGHT AT BEDTIME (Patient taking differently: Take 40 mg by mouth at bedtime. TAKE 1 TABLET BY MOUTH EVERY NIGHT AT BEDTIME) 90 tablet 1   BREZTRI  AEROSPHERE 160-9-4.8 MCG/ACT AERO INHALE 2 PUFFS INTO THE LUNGS TWICE DAILY 10.7 g 11   bumetanide (BUMEX) 1 MG tablet Take 2 tablets (2 mg total) by mouth 2 (two) times daily. And additional 2 tablets PM PRN 360 tablet 3   Calcium Carbonate-Vitamin D (CALCIUM 600+D PO) Take 1 tablet by mouth daily.     carvedilol (COREG) 25 MG tablet Take 25 mg by mouth 2 (two) times daily with a meal.     Coenzyme Q10 (COQ10) 200 MG CAPS Take 200 mg by mouth daily.     diclofenac Sodium (VOLTAREN) 1 % GEL Apply 2 g topically 4 (four)  times daily.     ELIQUIS 5 MG TABS tablet TAKE 1 TABLET BY MOUTH TWICE DAILY 180 tablet 2   ferrous sulfate 325 (65 FE) MG tablet Take 1 tablet (325 mg total) by mouth daily with breakfast.  3   fluticasone (FLONASE) 50 MCG/ACT nasal spray Place 2 sprays into both nostrils daily.  2   ipratropium-albuterol (DUONEB) 0.5-2.5 (3) MG/3ML SOLN Inhale 3 mLs into the lungs every 4 (four) hours as needed. Inhale 3 mls into the lungs every 4 to 6 hours and as needed 360 mL 3   JARDIANCE 10 MG TABS tablet TAKE 1 TABLET(10 MG) BY MOUTH DAILY BEFORE BREAKFAST (Patient taking differently: Take 10 mg by mouth daily.) 30 tablet 5   levothyroxine (SYNTHROID) 25 MCG tablet TAKE 1 TABLET BY MOUTH DAILY BEFORE BREAKFAST 90 tablet 1   lisinopril (ZESTRIL) 40 MG tablet TAKE 1 TABLET(40 MG) BY MOUTH DAILY (Patient taking differently: Take 40 mg by mouth daily.) 90 tablet 3   loratadine (CLARITIN) 10 MG tablet Take 10 mg by mouth daily.     oxyCODONE-acetaminophen (PERCOCET) 10-325 MG tablet Take 1 tablet by mouth 2 (two) times daily as needed for pain. 60 tablet 0   pantoprazole (PROTONIX) 40 MG tablet TAKE 1 TABLET BY MOUTH EVERY DAY 90 tablet 1   potassium chloride (KLOR-CON) 10 MEQ tablet TAKE 4 TABLETS BY MOUTH TWICE DAILY 720 tablet 3   sertraline (ZOLOFT) 100 MG tablet TAKE 1 TABLET BY MOUTH ONCE A DAY FOR ANXIETY (Patient taking differently: Take 100 mg by mouth daily. TAKE 1 TABLET BY MOUTH ONCE A  DAY FOR ANXIETY) 90 tablet 2   sodium chloride (OCEAN) 0.65 % SOLN nasal spray Place 1 spray into both nostrils as needed for congestion.     traZODone (DESYREL) 50 MG tablet TAKE 1 TABLET BY MOUTH EVERY DAY AT BEDTIME 90 tablet 1   VENTOLIN HFA 108 (90 Base) MCG/ACT inhaler INHALE 2 PUFFS INTO THE LUNGS EVERY 6 HOURS AS NEEDED FOR WHEEZING OR SHORTNESS OF BREATH 18 g 3   chlorpheniramine-HYDROcodone (TUSSIONEX) 10-8 MG/5ML Take 5 mLs by mouth every 12 (twelve) hours as needed. (Patient not taking: Reported on 03/08/2023) 140 mL 0   guaiFENesin-dextromethorphan (ROBITUSSIN DM) 100-10 MG/5ML syrup Take 5 mLs by mouth every 4 (four) hours as needed for cough. (Patient not taking: Reported on 03/08/2023) 118 mL 0   methocarbamol (ROBAXIN) 500 MG tablet Take 1 tablet (500 mg total) by mouth every 6 (six) hours as needed for muscle spasms. (Patient not taking: Reported on 03/08/2023) 90 tablet 2   predniSONE (STERAPRED UNI-PAK 21 TAB) 10 MG (21) TBPK tablet Use as directed for 6 days (Patient not taking: Reported on 03/08/2023) 21 tablet 0   revefenacin (YUPELRI) 175 MCG/3ML nebulizer solution Take 3 mLs (175 mcg total) by nebulization daily. 90 mL 5   No current facility-administered medications for this visit.   Vitals:   03/08/23 1057  BP: (!) 108/47  Pulse: 71  Resp: 16  SpO2: 94%   Wt Readings from Last 3 Encounters:  03/08/23 183 lb (83 kg)  03/06/23 182 lb 15.7 oz (83 kg)  03/01/23 185 lb (83.9 kg)   Lab Results  Component Value Date   CREATININE 0.72 03/06/2023   CREATININE 0.68 02/06/2023   CREATININE 0.72 02/05/2023   PHYSICAL EXAM:  General:  Well appearing. No resp difficulty HEENT: normal Neck: supple. JVP flat. No lymphadenopathy or thryomegaly appreciated. Cor: PMI normal. Regular rate & rhythm. No rubs,  gallops or murmurs. Lungs: clear Abdomen: soft, nontender, nondistended. No hepatosplenomegaly. No bruits or masses.  Extremities: no cyanosis, clubbing, rash, trace pitting  edema bilateral lower legs Neuro: alert & orientedx3, cranial nerves grossly intact. Moves all 4 extremities w/o difficulty. Affect pleasant.   ECG: not done  ReDs: 25%   ASSESSMENT & PLAN:  1: Chronic heart failure with preserved ejection fraction with LVH- - NYHA class III - euvolemic today - weighing daily; reminded to call for an overnight weight gain of >2 pounds or a weekly weight gain of >5 pounds - she deferred getting weighed today; says that her home weight was 183 pounds - echo 11/04/22: EF >55%, moderate LVH Echo 07/24/22: EF of >55% along with mild LVH. Echo 11/11/21: EF of 60-65%. Echo 05/16/21: EF of 45% along with mild LVH. Echo 02/07/20: EF of >55% along with severe LAE.  - Cath 10/19/22: Reduced cardiac index at rest with PCWP 13 mmHg.  Moderate increase in PA pressure; PVR 3.9 Wu.  RA pressure 8 mm Hg. No obstructive CAD; left dominant system. - not adding salt and is using pepper for seasoning; reviewed the importance of closely following a low sodium diet  - doesn't cook so eating frozen/ easily prepared foods such as chicken nuggets, pizza etc; does look at food labels and emphasized that she keep her daily sodium to 2000mg  - she is unsure of fluid intake so advised to measure how many ounces her cup holds so that she can keep her daily fluid intake to ~ 60 ounces - continue carvedilol 25mg  BID - continue jardiance 10mg  daily - continue lisinopril 40mg  daily - continue bumex 2mg  BID/ potassium BID - ReDs today is 25% - BNP 02/01/23 was 330.6  2: HTN- - BP 108/47 - had video visit with PCP (Abernathy) 06/24 - BMP 03/06/23 reviewed and showed sodium 137, potassium 3.7, creatinine 0.72 and GFR >60  3: severe COPD- - wearing oxygen at 5L around the clock at home; when she comes to appointments, she has to put her portable tank on 3L/ this tank is pulse and she doesn't like it as much as her continuous oxygen at home - saw pulmonology Welton Flakes) 06/24  4: Atrial  fibrillation-  - dual chamber pacemaker placed June 2021 - has had multiple cardioversions - apixaban 5mg  BID - saw cardiology (Paraschos) 04/24  5: TAVR- - s/p Wheat and hemiarch procedure with a #46mm CE Magna bovine pericardial valve on 03/30/2007 for bicuspid aortic valve syndrome  - saw cardiothoracic provider Romeo Apple) 10/19/22 - TAVR done 11/02/22  6: Neck pain- - going to pain management clinic later today  Return in 1 month, sooner if needed.

## 2023-03-08 NOTE — Patient Instructions (Signed)
____________________________________________________________________________________________  Blood Thinners  IMPORTANT NOTICE:  If you take any of these, make sure to notify the nursing staff.  Failure to do so may result in injury.  Recommended time intervals to stop and restart blood-thinners, before & after invasive procedures  Generic Name Brand Name Pre-procedure. Stop this long before procedure. Post-procedure. Minimum waiting period before restarting.  Abciximab Reopro 15 days 2 hrs  Alteplase Activase 10 days 10 days  Anagrelide Agrylin    Apixaban Eliquis 3 days 6 hrs  Cilostazol Pletal 3 days 5 hrs  Clopidogrel Plavix 7-10 days 2 hrs  Dabigatran Pradaxa 5 days 6 hrs  Dalteparin Fragmin 24 hours 4 hrs  Dipyridamole Aggrenox 11days 2 hrs  Edoxaban Lixiana; Savaysa 3 days 2 hrs  Enoxaparin  Lovenox 24 hours 4 hrs  Eptifibatide Integrillin 8 hours 2 hrs  Fondaparinux  Arixtra 72 hours 12 hrs  Hydroxychloroquine Plaquenil 11 days   Prasugrel Effient 7-10 days 6 hrs  Reteplase Retavase 10 days 10 days  Rivaroxaban Xarelto 3 days 6 hrs  Ticagrelor Brilinta 5-7 days 6 hrs  Ticlopidine Ticlid 10-14 days 2 hrs  Tinzaparin Innohep 24 hours 4 hrs  Tirofiban Aggrastat 8 hours 2 hrs  Warfarin Coumadin 5 days 2 hrs   Other medications with blood-thinning effects  Product indications Generic (Brand) names Note  Cholesterol Lipitor Stop 4 days before procedure  Blood thinner (injectable) Heparin (LMW or LMWH Heparin) Stop 24 hours before procedure  Cancer Ibrutinib (Imbruvica) Stop 7 days before procedure  Malaria/Rheumatoid Hydroxychloroquine (Plaquenil) Stop 11 days before procedure  Thrombolytics  10 days before or after procedures   Over-the-counter (OTC) Products with blood-thinning effects  Product Common names Stop Time  Aspirin > 325 mg Goody Powders, Excedrin, etc. 11 days  Aspirin ? 81 mg  7 days  Fish oil  4 days  Garlic supplements  7 days  Ginkgo biloba  36  hours  Ginseng  24 hours  NSAIDs Ibuprofen, Naprosyn, etc. 3 days  Vitamin E  4 days   ____________________________________________________________________________________________    ______________________________________________________________________  Procedure instructions  Do not eat or drink fluids (other than water) for 6 hours before your procedure  No water for 2 hours before your procedure  Take your blood pressure medicine with a sip of water  Arrive 30 minutes before your appointment  Carefully read the "Preparing for your procedure" detailed instructions  If you have questions call us at [phone]  _____________________________________________________________________    ______________________________________________________________________  Preparing for your procedure  Appointments: If you think you may not be able to keep your appointment, call 24-48 hours in advance to cancel. We need time to make it available to others.  During your procedure appointment there will be: No Prescription Refills. No disability issues to discussed. No medication changes or discussions.  Instructions: Food intake: Avoid eating anything solid for at least 8 hours prior to your procedure. Clear liquid intake: You may take clear liquids such as water up to 2 hours prior to your procedure. (No carbonated drinks. No soda.) Transportation: Unless otherwise stated by your physician, bring a driver. Morning Medicines: Except for blood thinners, take all of your other morning medications with a sip of water. Make sure to take your heart and blood pressure medicines. If your blood pressure's lower number is above 100, the case will be rescheduled. Blood thinners: Make sure to stop your blood thinners as instructed.  If you take a blood thinner, but were not instructed to stop it,   call our office [phone] and ask to talk to a nurse. Not stopping a blood thinner prior to certain  procedures could lead to serious complications. Diabetics on insulin: Notify the staff so that you can be scheduled 1st case in the morning. If your diabetes requires high dose insulin, take only  of your normal insulin dose the morning of the procedure and notify the staff that you have done so. Preventing infections: Shower with an antibacterial soap the morning of your procedure.  Build-up your immune system: Take 1000 mg of Vitamin C with every meal (3 times a day) the day prior to your procedure. Antibiotics: Inform the nursing staff if you are taking any antibiotics or if you have any conditions that may require antibiotics prior to procedures. (Example: recent joint implants)   Pregnancy: If you are pregnant make sure to notify the nursing staff. Not doing so may result in injury to the fetus, including death.  Sickness: If you have a cold, fever, or any active infections, call and cancel or reschedule your procedure. Receiving steroids while having an infection may result in complications. Arrival: You must be in the facility at least 30 minutes prior to your scheduled procedure. Tardiness: Your scheduled time is also the cutoff time. If you do not arrive at least 15 minutes prior to your procedure, you will be rescheduled.  Children: Do not bring any children with you. Make arrangements to keep them home. Dress appropriately: There is always a possibility that your clothing may get soiled. Avoid long dresses. Valuables: Do not bring any jewelry or valuables.  Reasons to call and reschedule or cancel your procedure: (Following these recommendations will minimize the risk of a serious complication.) Surgeries: Avoid having procedures within 2 weeks of any surgery. (Avoid for 2 weeks before or after any surgery). Flu Shots: Avoid having procedures within 2 weeks of a flu shots or . (Avoid for 2 weeks before or after immunizations). Barium: Avoid having a procedure within 7-10 days after having  had a radiological study involving the use of radiological contrast. (Myelograms, Barium swallow or enema study). Heart attacks: Avoid any elective procedures or surgeries for the initial 6 months after a "Myocardial Infarction" (Heart Attack). Blood thinners: It is imperative that you stop these medications before procedures. Let us know if you if you take any blood thinner.  Infection: Avoid procedures during or within two weeks of an infection (including chest colds or gastrointestinal problems). Symptoms associated with infections include: Localized redness, fever, chills, night sweats or profuse sweating, burning sensation when voiding, cough, congestion, stuffiness, runny nose, sore throat, diarrhea, nausea, vomiting, cold or Flu symptoms, recent or current infections. It is specially important if the infection is over the area that we intend to treat. Heart and lung problems: Symptoms that may suggest an active cardiopulmonary problem include: cough, chest pain, breathing difficulties or shortness of breath, dizziness, ankle swelling, uncontrolled high or unusually low blood pressure, and/or palpitations. If you are experiencing any of these symptoms, cancel your procedure and contact your primary care physician for an evaluation.  Remember:  Regular Business hours are:  Monday to Thursday 8:00 AM to 4:00 PM  Provider's Schedule: Ellakate Gonsalves, MD:  Procedure days: Tuesday and Thursday 7:30 AM to 4:00 PM  Bilal Lateef, MD:  Procedure days: Monday and Wednesday 7:30 AM to 4:00 PM  ______________________________________________________________________    ____________________________________________________________________________________________  General Risks and Possible Complications  Patient Responsibilities: It is important that you read this as it   is part of your informed consent. It is our duty to inform you of the risks and possible complications associated with treatments  offered to you. It is your responsibility as a patient to read this and to ask questions about anything that is not clear or that you believe was not covered in this document.  Patient's Rights: You have the right to refuse treatment. You also have the right to change your mind, even after initially having agreed to have the treatment done. However, under this last option, if you wait until the last second to change your mind, you may be charged for the materials used up to that point.  Introduction: Medicine is not an exact science. Everything in Medicine, including the lack of treatment(s), carries the potential for danger, harm, or loss (which is by definition: Risk). In Medicine, a complication is a secondary problem, condition, or disease that can aggravate an already existing one. All treatments carry the risk of possible complications. The fact that a side effects or complications occurs, does not imply that the treatment was conducted incorrectly. It must be clearly understood that these can happen even when everything is done following the highest safety standards.  No treatment: You can choose not to proceed with the proposed treatment alternative. The "PRO(s)" would include: avoiding the risk of complications associated with the therapy. The "CON(s)" would include: not getting any of the treatment benefits. These benefits fall under one of three categories: diagnostic; therapeutic; and/or palliative. Diagnostic benefits include: getting information which can ultimately lead to improvement of the disease or symptom(s). Therapeutic benefits are those associated with the successful treatment of the disease. Finally, palliative benefits are those related to the decrease of the primary symptoms, without necessarily curing the condition (example: decreasing the pain from a flare-up of a chronic condition, such as incurable terminal cancer).  General Risks and Complications: These are associated to most  interventional treatments. They can occur alone, or in combination. They fall under one of the following six (6) categories: no benefit or worsening of symptoms; bleeding; infection; nerve damage; allergic reactions; and/or death. No benefits or worsening of symptoms: In Medicine there are no guarantees, only probabilities. No healthcare provider can ever guarantee that a medical treatment will work, they can only state the probability that it may. Furthermore, there is always the possibility that the condition may worsen, either directly, or indirectly, as a consequence of the treatment. Bleeding: This is more common if the patient is taking a blood thinner, either prescription or over the counter (example: Goody Powders, Fish oil, Aspirin, Garlic, etc.), or if suffering a condition associated with impaired coagulation (example: Hemophilia, cirrhosis of the liver, low platelet counts, etc.). However, even if you do not have one on these, it can still happen. If you have any of these conditions, or take one of these drugs, make sure to notify your treating physician. Infection: This is more common in patients with a compromised immune system, either due to disease (example: diabetes, cancer, human immunodeficiency virus [HIV], etc.), or due to medications or treatments (example: therapies used to treat cancer and rheumatological diseases). However, even if you do not have one on these, it can still happen. If you have any of these conditions, or take one of these drugs, make sure to notify your treating physician. Nerve Damage: This is more common when the treatment is an invasive one, but it can also happen with the use of medications, such as those used in the treatment   of cancer. The damage can occur to small secondary nerves, or to large primary ones, such as those in the spinal cord and brain. This damage may be temporary or permanent and it may lead to impairments that can range from temporary numbness to  permanent paralysis and/or brain death. Allergic Reactions: Any time a substance or material comes in contact with our body, there is the possibility of an allergic reaction. These can range from a mild skin rash (contact dermatitis) to a severe systemic reaction (anaphylactic reaction), which can result in death. Death: In general, any medical intervention can result in death, most of the time due to an unforeseen complication. ____________________________________________________________________________________________    

## 2023-03-08 NOTE — Telephone Encounter (Signed)
Patient wants to know what can she do for her pain ? To help until her injections on Tues.

## 2023-03-08 NOTE — Progress Notes (Signed)
   03/08/23 1130  ReDS Vest / Clip  Station Marker B  Ruler Value 35  ReDS Value Range < 36  ReDS Actual Value 25

## 2023-03-09 ENCOUNTER — Ambulatory Visit
Admission: RE | Admit: 2023-03-09 | Discharge: 2023-03-09 | Disposition: A | Discharge status: Home / Self Care | Payer: Medicare Other | Source: Ambulatory Visit | Attending: Radiation Oncology | Admitting: Radiation Oncology

## 2023-03-09 ENCOUNTER — Other Ambulatory Visit: Payer: Self-pay

## 2023-03-09 ENCOUNTER — Ambulatory Visit: Payer: Medicare Other

## 2023-03-09 DIAGNOSIS — R911 Solitary pulmonary nodule: Secondary | ICD-10-CM | POA: Diagnosis not present

## 2023-03-09 DIAGNOSIS — C3411 Malignant neoplasm of upper lobe, right bronchus or lung: Secondary | ICD-10-CM | POA: Insufficient documentation

## 2023-03-09 DIAGNOSIS — Z510 Encounter for antineoplastic radiation therapy: Secondary | ICD-10-CM | POA: Insufficient documentation

## 2023-03-09 DIAGNOSIS — Z87891 Personal history of nicotine dependence: Secondary | ICD-10-CM | POA: Diagnosis not present

## 2023-03-09 LAB — RAD ONC ARIA SESSION SUMMARY
Course Elapsed Days: 47
Plan Fractions Treated to Date: 5
Plan Prescribed Dose Per Fraction: 12 Gy
Plan Total Fractions Prescribed: 5
Plan Total Prescribed Dose: 60 Gy
Reference Point Dosage Given to Date: 51.5777 Gy
Reference Point Session Dosage Given: 3.5777 Gy
Session Number: 5

## 2023-03-15 ENCOUNTER — Ambulatory Visit
Admission: RE | Admit: 2023-03-15 | Discharge: 2023-03-15 | Disposition: A | Discharge status: Home / Self Care | Payer: Medicare Other | Source: Ambulatory Visit | Attending: Internal Medicine | Admitting: Internal Medicine

## 2023-03-15 ENCOUNTER — Ambulatory Visit: Payer: Medicare Other | Admitting: Radiation Oncology

## 2023-03-15 DIAGNOSIS — C3411 Malignant neoplasm of upper lobe, right bronchus or lung: Secondary | ICD-10-CM | POA: Insufficient documentation

## 2023-03-15 DIAGNOSIS — R053 Chronic cough: Secondary | ICD-10-CM | POA: Insufficient documentation

## 2023-03-15 DIAGNOSIS — J929 Pleural plaque without asbestos: Secondary | ICD-10-CM | POA: Diagnosis not present

## 2023-03-15 DIAGNOSIS — J432 Centrilobular emphysema: Secondary | ICD-10-CM | POA: Diagnosis not present

## 2023-03-16 ENCOUNTER — Encounter: Payer: Self-pay | Admitting: Pain Medicine

## 2023-03-16 ENCOUNTER — Ambulatory Visit: Payer: Medicare Other | Attending: Pain Medicine | Admitting: Pain Medicine

## 2023-03-16 VITALS — BP 134/70 | HR 70 | Temp 97.1°F | Resp 16 | Ht <= 58 in | Wt 179.0 lb

## 2023-03-16 DIAGNOSIS — M629 Disorder of muscle, unspecified: Secondary | ICD-10-CM | POA: Diagnosis not present

## 2023-03-16 DIAGNOSIS — M25512 Pain in left shoulder: Secondary | ICD-10-CM | POA: Diagnosis not present

## 2023-03-16 DIAGNOSIS — G8929 Other chronic pain: Secondary | ICD-10-CM | POA: Insufficient documentation

## 2023-03-16 DIAGNOSIS — M542 Cervicalgia: Secondary | ICD-10-CM | POA: Insufficient documentation

## 2023-03-16 DIAGNOSIS — Z7901 Long term (current) use of anticoagulants: Secondary | ICD-10-CM | POA: Diagnosis not present

## 2023-03-16 DIAGNOSIS — M7918 Myalgia, other site: Secondary | ICD-10-CM | POA: Insufficient documentation

## 2023-03-16 DIAGNOSIS — M25519 Pain in unspecified shoulder: Secondary | ICD-10-CM | POA: Diagnosis not present

## 2023-03-16 DIAGNOSIS — M898X1 Other specified disorders of bone, shoulder: Secondary | ICD-10-CM | POA: Diagnosis not present

## 2023-03-16 DIAGNOSIS — M25511 Pain in right shoulder: Secondary | ICD-10-CM | POA: Insufficient documentation

## 2023-03-16 MED ORDER — LIDOCAINE HCL (PF) 2 % IJ SOLN
INTRAMUSCULAR | Status: AC
  Filled 2023-03-16: qty 10

## 2023-03-16 MED ORDER — ROPIVACAINE HCL 2 MG/ML IJ SOLN
9.0000 mL | Freq: Once | INTRAMUSCULAR | Status: AC
  Administered 2023-03-16: 9 mL

## 2023-03-16 MED ORDER — ROPIVACAINE HCL 2 MG/ML IJ SOLN
INTRAMUSCULAR | Status: AC
  Filled 2023-03-16: qty 20

## 2023-03-16 MED ORDER — TRIAMCINOLONE ACETONIDE 40 MG/ML IJ SUSP
INTRAMUSCULAR | Status: AC
  Filled 2023-03-16: qty 1

## 2023-03-16 MED ORDER — PENTAFLUOROPROP-TETRAFLUOROETH EX AERO
INHALATION_SPRAY | Freq: Once | CUTANEOUS | Status: DC
  Filled 2023-03-16: qty 116

## 2023-03-16 MED ORDER — LIDOCAINE HCL 2 % IJ SOLN
20.0000 mL | Freq: Once | INTRAMUSCULAR | Status: AC
  Administered 2023-03-16: 200 mg

## 2023-03-16 MED ORDER — TRIAMCINOLONE ACETONIDE 40 MG/ML IJ SUSP
40.0000 mg | Freq: Once | INTRAMUSCULAR | Status: AC
  Administered 2023-03-16: 40 mg

## 2023-03-16 NOTE — Patient Instructions (Signed)

## 2023-03-16 NOTE — Progress Notes (Signed)
PROVIDER NOTE: Interpretation of information contained herein should be left to medically-trained personnel. Specific patient instructions are provided elsewhere under "Patient Instructions" section of medical record. This document was created in part using STT-dictation technology, any transcriptional errors that may result from this process are unintentional.  Patient: [name] Type: Established DOB: 19-May-1946 MRN: [id number] PCP: Lyndon Code, MD  Service: Procedure DOS: 03/16/2023 Setting: Ambulatory Location: Ambulatory outpatient facility Delivery: Face-to-face Provider: Oswaldo Done, MD Specialty: Interventional Pain Management Specialty designation: 09 Location: Outpatient facility Ref. Prov.: Delano Metz, MD       Interventional Therapy   Type: Trapezius and supraspinatus muscle Trigger Point Injection (Myoneural Block) (1-2 muscle groups)  #3           CPT: 20552 Laterality: Bilateral (-50)   Imaging: N/A. Landmark-guided  Anesthesia: Local anesthesia (1-2% Lidocaine) Anxiolysis: None                 Sedation: No Sedation                       DOS: 03/16/2023  Performed by: Oswaldo Done, MD  Medical Necessity (reasoning)  Purpose: Diagnostic/Therapeutic Rationale (medical necessity): procedure needed and proper for the diagnosis and/or treatment of Ms. Geraldo's medical symptoms and needs. Indications: Upper back pain severe enough to impact quality of life and/or function. 1. Musculoskeletal disorder involving upper trapezius muscle   2. Chronic myofascial pain   3. Chronic neck pain (3ry area of Pain) (Posterior) (Bilateral) (L>R)   4. Chronic shoulder pain (1ry area of Pain) (Bilateral) (L>R)   5. Trigger point of shoulder region, unspecified laterality   6. Trigger point with neck pain   7. Chronic anticoagulation (Eliquis)    NAS-11 Pain score:   Pre-procedure: 10-Worst pain ever/10   Post-procedure: 5 /10       Muscle: Trapezius  muscle and supraspinatus muscle Target: Myoneural mass of trigger point. Region: Upper back Anatomic Surface: Posterior Anatomic Area: Cervico-thoracic Level:    C7 spinous process     Approach: Percutaneous  Type of procedure: Myoneural injection   Position  Prep  Materials  Position: Sitting. Patient assisted into a comfortable position. Pressure points checked.  Prep solution: DuraPrep (Iodine Povacrylex [0.7% available iodine] and Isopropyl Alcohol, 74% w/w) The target area was identified and the area prepped in the usual manner.  Prep Area: Entire  Upper  Back  Region  Materials:   Tray: Block Needle(s):  Type: Regular  Gauge (G): 22  Length: 1.5-in  Qty: 1  H&P (Pre-op Assessment):  [name] is a 77 y.o. (year old), female patient, seen today for interventional treatment. She  has a past surgical history that includes Abdominal aortic aneurysm repair (2008); Aortic valve replacement (2008); Abdominal hysterectomy; Carpal tunnel release; Tumor excision (Left); Cardiac catheterization; Cardiac catheterization (N/A, 08/17/2016); CARDIOVERSION (N/A, 08/15/2018); CARDIOVERSION (N/A, 11/14/2018); Cardioversion (N/A, 06/05/2019); Cardioversion (N/A, 08/14/2019); Cardioversion (N/A, 11/09/2019); and Cardioversion (N/A, 01/17/2020). Ms. Sandi has a current medication list which includes the following prescription(s): alprazolam, amlodipine, atorvastatin, breztri aerosphere, bumetanide, calcium carbonate-vitamin d, carvedilol, chlorpheniramine-hydrocodone, coq10, diclofenac sodium, eliquis, ferrous sulfate, fluticasone, ipratropium-albuterol, jardiance, levothyroxine, lisinopril, loratadine, oxycodone-acetaminophen, pantoprazole, potassium chloride, revefenacin, sertraline, sodium chloride, trazodone, and ventolin hfa, and the following Facility-Administered Medications: pentafluoroprop-tetrafluoroeth. Her primarily concern today is the Back Pain (Upper bilateral ) and Shoulder Pain (Bilateral  )  Initial Vital Signs:  Pulse/HCG Rate: 70  Temp: (!) 97.1 F (36.2 C) Resp: 16 BP: 134/70 SpO2: [name])  83 % (5 liters)  BMI: Estimated body mass index is 37.41 kg/m as calculated from the following:   Height as of this encounter: 4\' 10"  (1.473 m).   Weight as of this encounter: 179 lb (81.2 kg).  Risk Assessment: Allergies: Reviewed. She is allergic to benadryl [diphenhydramine hcl (sleep)], cetirizine, lasix [furosemide], levaquin [levofloxacin in d5w], meloxicam, and sulfa antibiotics.  Allergy Precautions: None required Coagulopathies: Reviewed. None identified.  Blood-thinner therapy: None at this time Active Infection(s): Reviewed. None identified. Ms. Barge is afebrile  Site Confirmation: Ms. Hampel was asked to confirm the procedure and laterality before marking the site Procedure checklist: Completed Consent: Before the procedure and under the influence of no sedative(s), amnesic(s), or anxiolytics, the patient was informed of the treatment options, risks and possible complications. To fulfill our ethical and legal obligations, as recommended by the American Medical Association's Code of Ethics, I have informed the patient of my clinical impression; the nature and purpose of the treatment or procedure; the risks, benefits, and possible complications of the intervention; the alternatives, including doing nothing; the risk(s) and benefit(s) of the alternative treatment(s) or procedure(s); and the risk(s) and benefit(s) of doing nothing. The patient was provided information about the general risks and possible complications associated with the procedure. These may include, but are not limited to: failure to achieve desired goals, infection, bleeding, organ or nerve damage, allergic reactions, paralysis, and death. In addition, the patient was informed of those risks and complications associated to the procedure, such as failure to decrease pain; infection; bleeding; organ or nerve damage  with subsequent damage to sensory, motor, and/or autonomic systems, resulting in permanent pain, numbness, and/or weakness of one or several areas of the body; allergic reactions; (i.e.: anaphylactic reaction); and/or death. Furthermore, the patient was informed of those risks and complications associated with the medications. These include, but are not limited to: allergic reactions (i.e.: anaphylactic or anaphylactoid reaction(s)); adrenal axis suppression; blood sugar elevation that in diabetics may result in ketoacidosis or comma; water retention that in patients with history of congestive heart failure may result in shortness of breath, pulmonary edema, and decompensation with resultant heart failure; weight gain; swelling or edema; medication-induced neural toxicity; particulate matter embolism and blood vessel occlusion with resultant organ, and/or nervous system infarction; and/or aseptic necrosis of one or more joints. Finally, the patient was informed that Medicine is not an exact science; therefore, there is also the possibility of unforeseen or unpredictable risks and/or possible complications that may result in a catastrophic outcome. The patient indicated having understood very clearly. We have given the patient no guarantees and we have made no promises. Enough time was given to the patient to ask questions, all of which were answered to the patient's satisfaction. Ms. Palomarez has indicated that she wanted to continue with the procedure. Attestation: I, the ordering provider, attest that I have discussed with the patient the benefits, risks, side-effects, alternatives, likelihood of achieving goals, and potential problems during recovery for the procedure that I have provided informed consent. Date  Time: 03/16/2023 11:43 AM   Pre-Procedure Preparation:  Monitoring: As per clinic protocol. Respiration, ETCO2, SpO2, BP, heart rate and rhythm monitor placed and checked for adequate function Safety  Precautions: Patient was assessed for positional comfort and pressure points before starting the procedure. Time-out: I initiated and conducted the "Time-out" before starting the procedure, as per protocol. The patient was asked to participate by confirming the accuracy of the "Time Out" information. Verification of the correct person,  site, and procedure were performed and confirmed by me, the nursing staff, and the patient. "Time-out" conducted as per Joint Commission's Universal Protocol (UP.01.01.01). Time: 1158 Start Time: 1158 hrs.   Narrative                Start Time: 1158 hrs.  Standard Safety Precautions: Protocol guidelines were followed. Aspiration was conducted prior to injection. At no point did I inject any substances, as a needle was being advanced. No attempts were made at seeking a paresthesia. Safe injection practices and needle disposal techniques used. Medications properly checked for expiration dates. SDV (single dose vial) medications used.  Local Anesthesia: Skin & deeper tissues infiltrated with local anesthetic. Appropriate amount of time allowed for local anesthetics to take effect.   Technical description:  The target area was identified and the area prepped in the usual manner. The procedure needles were then advanced to the target area. Proper needle placement secured. Negative aspiration confirmed. Solution injected in intermittent fashion, asking for systemic symptoms every 0.5cc of injectate. The needles were then removed and the area cleansed, making sure to leave some of the prepping solution back to take advantage of its long term bactericidal properties.  Vitals:   03/16/23 1133  BP: 134/70  Pulse: 70  Resp: 16  Temp: (!) 97.1 F (36.2 C)  TempSrc: Temporal  SpO2: (!) 83%  Weight: 179 lb (81.2 kg)  Height: 4\' 10"  (1.473 m)     End Time: 1159 hrs.  Imaging Guidance                 :          Type of Imaging Technique: None used Indication(s):  N/A Exposure Time: No patient exposure Contrast: None used. Fluoroscopic Guidance: N/A Ultrasound Guidance: N/A Interpretation: N/A   Post-operative Assessment:  Post-procedure Vital Signs:  Pulse/HCG Rate: 70  Temp: (!) 97.1 F (36.2 C) Resp: 16 BP: 134/70 SpO2: (!) 83 % (5 liters)  EBL: None  Complications: No immediate post-treatment complications observed by team, or reported by patient.  Note: The patient tolerated the entire procedure well. A repeat set of vitals were taken after the procedure and the patient was kept under observation following institutional policy, for this type of procedure. Post-procedural neurological assessment was performed, showing return to baseline, prior to discharge. The patient was provided with post-procedure discharge instructions, including a section on how to identify potential problems. Should any problems arise concerning this procedure, the patient was given instructions to immediately contact us, at any time, without hesitation. In any case, we plan to contact the patient by telephone for a follow-up status report regarding this interventional procedure.  Comments:  No additional relevant information.   Plan of Care (POC)  Orders:  Orders Placed This Encounter  Procedures   TRIGGER POINT INJECTION    Scheduling Instructions:     Area: Upper Back     Side: Bilateral     Sedation: No Sedation.     Timeframe: Today    Order Specific Question:   Where will this procedure be performed?    Answer:   [facility]   Informed Consent Details: Physician/Practitioner Attestation; Transcribe to consent form and obtain patient signature    Provider Attestation: I, Chastity Noland A. Laban Emperor, MD, (Pain Management Specialist), the physician/practitioner, attest that I have discussed with the patient the benefits, risks, side effects, alternatives, likelihood of achieving goals and potential problems during recovery for the procedure that I have  provided  informed consent.    Scheduling Instructions:     Note: Always confirm laterality of pain with Ms. Uresti, before procedure.     Transcribe to consent form and obtain patient signature.    Order Specific Question:   Physician/Practitioner attestation of informed consent for procedure/surgical case    Answer:   I, the physician/practitioner, attest that I have discussed with the patient the benefits, risks, side effects, alternatives, likelihood of achieving goals and potential problems during recovery for the procedure that I have provided informed consent.    Order Specific Question:   Procedure    Answer:   Myoneural Block (Trigger Point injection)    Order Specific Question:   Physician/Practitioner performing the procedure    Answer:   Shariff Lasky A. Laban Emperor MD    Order Specific Question:   Indication/Reason    Answer:   Musculoskeletal pain/myofascial pain secondary to trigger point   Care order/instruction: Please confirm that the patient has stopped the Eliquis (Apixaban) x 3 days prior to procedure or surgery.    Please confirm that the patient has stopped the Eliquis (Apixaban) x 3 days prior to procedure or surgery.    Standing Status:   Standing    Number of Occurrences:   1   Provide equipment / supplies at bedside    Procedure tray: "Block Tray" (Disposable  single use) Skin infiltration needle: Regular 1.5-in, 25-G, (x1) Block Needle type: Regular Amount/quantity: 1 Size: Short(1.5-inch) Gauge: (25G x1) + (22G x1)    Standing Status:   Standing    Number of Occurrences:   1    Order Specific Question:   Specify    Answer:   Block Tray   Bleeding precautions    Standing Status:   Standing    Number of Occurrences:   1   Chronic Opioid Analgesic:  No chronic opioid analgesics therapy prescribed by our practice. Oxycodone/APAP 10-325, 1 tab p.o. BID by [name], FNP-C, working for Dr. Lillia Carmel, MD. MME/day: 30 mg/day   Medications ordered  for procedure: Meds ordered this encounter  Medications   lidocaine (XYLOCAINE) 2 % (with pres) injection 400 mg   pentafluoroprop-tetrafluoroeth (GEBAUERS) aerosol   ropivacaine (PF) 2 mg/mL (0.2%) (NAROPIN) injection 9 mL   triamcinolone acetonide (KENALOG-40) injection 40 mg   Medications administered: We administered lidocaine, ropivacaine (PF) 2 mg/mL (0.2%), and triamcinolone acetonide.  See the medical record for exact dosing, route, and time of administration.  Follow-up plan:   Return in about 2 weeks (around 03/30/2023) for Proc-day (T,Th), (Face2F), (PPE).       Interventional Therapies  Risk  Complexity Considerations:   Hard of hearing (HOH)  COPD  O2 dependent  positional orthopnea  Eliquis Anticoagulation: (Stop: 3 days  Restart: 6 hours)  A-fib  prosthetic AoV (Prophylactic ampicillin 2g IV)  Cardiac pacemaker     Planned  Pending:   Palliative bilateral trapezius + supraspinatus m. MNB/TPI #3    Under consideration:   Palliative bilateral trapezius + supraspinatus m. MNB/TPI #3    Completed:   Diagnostic bilateral suprascapular NB x2 (09/25/2021) (100/100/100/100 x 2.5 months)  Therapeutic bilateral Trapezius and supraspinatus MNB x2 (06/16/2022) (100/100/90/90)    Therapeutic  Palliative (PRN) options:   Therapeutic upper back and shoulder (trapezius & supraspinatus) MNB/TPI    Pharmacotherapy  Poor candidate for any medication that may induce respiratory depression.        Recent Visits Date Type Provider Dept  03/08/23 Office Visit Delano Metz, MD  [facility]  Showing recent visits within past 90 days and meeting all other requirements Today's Visits Date Type Provider Dept  03/16/23 Procedure visit Delano Metz, MD [facility]  Showing today's visits and meeting all other requirements Future Appointments Date Type Provider Dept  04/05/23 Appointment Delano Metz, MD [facility]  Showing  future appointments within next 90 days and meeting all other requirements  Disposition: Discharge home  Discharge (Date  Time): 03/16/2023; 1201 hrs.   Primary Care Physician: Lyndon Code, MD Location: [facility] Note by: Oswaldo Done, MD (TTS technology used. I apologize for any typographical errors that were not detected and corrected.) Date: 03/16/2023; Time: 12:17 PM  Disclaimer:  Medicine is not an Visual merchandiser. The only guarantee in medicine is that nothing is guaranteed. It is important to note that the decision to proceed with this intervention was based on the information collected from the patient. The Data and conclusions were drawn from the patient's questionnaire, the interview, and the physical examination. Because the information was provided in large part by the patient, it cannot be guaranteed that it has not been purposely or unconsciously manipulated. Every effort has been made to obtain as much relevant data as possible for this evaluation. It is important to note that the conclusions that lead to this procedure are derived in large part from the available data. Always take into account that the treatment will also be dependent on availability of resources and existing treatment guidelines, considered by other Pain Management Practitioners as being common knowledge and practice, at the time of the intervention. For Medico-Legal purposes, it is also important to point out that variation in procedural techniques and pharmacological choices are the acceptable norm. The indications, contraindications, technique, and results of the above procedure should only be interpreted and judged by a Board-Certified Interventional Pain Specialist with extensive familiarity and expertise in the same exact procedure and technique.

## 2023-03-16 NOTE — Progress Notes (Signed)
Safety precautions to be maintained throughout the outpatient stay will include: orient to surroundings, keep bed in low position, maintain call bell within reach at all times, provide assistance with transfer out of bed and ambulation.  

## 2023-03-17 ENCOUNTER — Ambulatory Visit: Payer: Medicare Other | Admitting: Nurse Practitioner

## 2023-03-17 ENCOUNTER — Telehealth: Payer: Self-pay | Admitting: *Deleted

## 2023-03-17 NOTE — Telephone Encounter (Signed)
Attempted to call for post procedure follow-up. Message left. 

## 2023-03-24 DIAGNOSIS — I5032 Chronic diastolic (congestive) heart failure: Secondary | ICD-10-CM | POA: Diagnosis not present

## 2023-03-28 ENCOUNTER — Other Ambulatory Visit: Payer: Self-pay | Admitting: Nurse Practitioner

## 2023-03-29 ENCOUNTER — Ambulatory Visit (INDEPENDENT_AMBULATORY_CARE_PROVIDER_SITE_OTHER): Payer: Medicare Other | Admitting: Internal Medicine

## 2023-03-29 ENCOUNTER — Encounter: Payer: Self-pay | Admitting: Internal Medicine

## 2023-03-29 VITALS — BP 110/80 | HR 89 | Temp 98.4°F | Resp 16 | Ht <= 58 in | Wt 182.0 lb

## 2023-03-29 DIAGNOSIS — G8929 Other chronic pain: Secondary | ICD-10-CM

## 2023-03-29 DIAGNOSIS — C3411 Malignant neoplasm of upper lobe, right bronchus or lung: Secondary | ICD-10-CM

## 2023-03-29 DIAGNOSIS — M25511 Pain in right shoulder: Secondary | ICD-10-CM | POA: Diagnosis not present

## 2023-03-29 DIAGNOSIS — F411 Generalized anxiety disorder: Secondary | ICD-10-CM

## 2023-03-29 DIAGNOSIS — J9611 Chronic respiratory failure with hypoxia: Secondary | ICD-10-CM | POA: Diagnosis not present

## 2023-03-29 DIAGNOSIS — I5032 Chronic diastolic (congestive) heart failure: Secondary | ICD-10-CM

## 2023-03-29 MED ORDER — ALPRAZOLAM 0.25 MG PO TABS
0.2500 mg | ORAL_TABLET | Freq: Two times a day (BID) | ORAL | 1 refills | Status: DC | PRN
Start: 2023-03-29 — End: 2023-05-06

## 2023-03-29 NOTE — Patient Instructions (Signed)
Lung Cancer Lung cancer is an abnormal growth of cancerous cells that forms a mass (malignant tumor) in a lung. There are several types of lung cancer. The types are based on the appearance of the tumor cells. The two most common types are: Non-small cell lung cancer. This type of lung cancer is the most common type. Non-small cell lung cancers include squamous cell carcinoma, adenocarcinoma, and large cell carcinoma. Small cell lung cancer. In this type of lung cancer, abnormal cells are smaller than those of non-small cell lung cancer. Small cell lung cancer gets worse (progresses) faster than non-small cell lung cancer. What are the causes? The most common cause of lung cancer is smoking tobacco. The second most common cause is exposure to a chemical called radon. What increases the risk? You are more likely to develop this condition if: You smoke tobacco. You have been exposed to: Secondhand tobacco smoke. Radon gas. Uranium. Asbestos. Arsenic in drinking water. Air pollution and diesel exhaust. You have a family or personal history of lung cancer. You have had lung radiation therapy in the past. You are older than age 65. What are the signs or symptoms? In the early stages, you may not have any symptoms. As the cancer progresses, symptoms may include: A lasting cough, possibly with blood. Fatigue. Unexplained weight loss. Shortness of breath. High-pitched whistling sounds when you breathe, most often when you breathe out (wheezing). Chest pain. Loss of appetite. Symptoms of advanced lung cancer include: Hoarseness. Bone or joint pain. Weakness. Change in the structure of the fingernails (clubbing), so that the nail looks like an upside-down spoon. Swelling of the face or arms. Inability to move the face (paralysis). Drooping eyelids. How is this diagnosed? This condition may be diagnosed based on: Your symptoms and medical history. A physical exam. A chest X-ray. A CT  scan. Blood tests. Sputum tests. Removal of a sample of lung tissue (lung biopsy) for testing. Your cancer will be assessed (staged) to determine how severe it is and how much it has spread (metastasized). How is this treated? Treatment depends on the type and stage of your cancer. Treatment may include one or more of the following: Surgery to remove as much of the cancer as possible. Lymph nodes in the area may be removed and tested for cancer as well. Medicines that kill cancer cells (chemotherapy). High-energy rays that kill cancer cells (radiation therapy). Targeted therapy. This targets specific parts of cancer cells and the area around them to block the growth and spread of the cancer. Targeted therapy can help limit the damage to healthy cells. Immunotherapy. This treatment uses a person's own immune system to fight cancer by either boosting the immune system or changing how the immune system works. Follow these instructions at home:  Do not use any products that contain nicotine or tobacco. These products include cigarettes, chewing tobacco, and vaping devices, such as e-cigarettes. If you need help quitting, ask your health care provider. Do not drink alcohol. If you are admitted to the hospital, make sure your cancer specialist (oncologist) is aware. Your cancer may affect your treatment for other conditions. Take over-the-counter and prescription medicines only as told by your health care provider. Work with your health care provider to manage any side effects of treatment. Keep all follow-up visits. This is important. Where to find support Consider joining a local support group for people who have been diagnosed with lung cancer. Where to find more information American Cancer Society: www.cancer.org National Cancer Institute (NCI):   www.cancer.gov Contact a health care provider if you: Lose weight without trying. Have a persistent cough and wheezing. Feel short of breath. Get  tired easily. Have bone or joint pain. Have difficulty swallowing. Notice that your voice is changing or getting hoarse. Have pain that does not get better with medicine. Get help right away if you: Cough up blood. Have chest pain or new breathing problems. Have a fever. Have swelling in an ankle, leg, or arm, or the face or neck. Have paralysis in your face. Are very confused. Have a drooping eyelid. These symptoms may represent a serious problem that is an emergency. Do not wait to see if the symptoms will go away. Get medical help right away. Call your local emergency services (911 in the [place] Do not drive yourself to the hospital. Summary Lung cancer is an abnormal growth of cancerous cells that forms a mass (malignant tumor) in a lung. There are several types of lung cancer. The types are based on the appearance of the tumor cells. The two most common types are non-small cell and small cell. The most common cause of lung cancer is smoking tobacco. Early symptoms include a lasting cough, possibly with blood, and fatigue, unexplained weight loss, and shortness of breath. After diagnosis, treatment depends on the type and stage of your cancer. This information is not intended to replace advice given to you by your health care provider. Make sure you discuss any questions you have with your health care provider. Document Revised: 02/12/2021 Document Reviewed: 02/12/2021 Elsevier Patient Education  2024 [employer]  

## 2023-03-29 NOTE — Progress Notes (Signed)
[facility] [address] [place], [place] [address]  Pulmonary Sleep Medicine   Office Visit Note  Patient Name: [name] DOB: [birth year] MRN [id number]  Date of Service: 03/29/2023  Complaints/HPI: She is back after a follow up CT scan done for the RUL nodule. She was not a surgical candidate for resection of the lesion. She did see Dr Rushie Chestnut for empiric RT. She has completed her course. On the follow up CT scan there appears to be an enlargement of the nodule. She also has been having pain issues and has been seeing pain management. She states she has not seen oncology. I will refer to the cancer center for another review as to what can be done for her. Without tissue it may be difficult to decide regarding any chemo because she is not an ideal candidate for surgical procedures at this time.Her last best FEV1 was 0.97L so definitely not resectable  Office Spirometry Results:     ROS  General: (-) fever, (-) chills, (-) night sweats, (-) weakness Skin: (-) rashes, (-) itching,. Eyes: (-) visual changes, (-) redness, (-) itching. Nose and Sinuses: (-) nasal stuffiness or itchiness, (-) postnasal drip, (-) nosebleeds, (-) sinus trouble. Mouth and Throat: (-) sore throat, (-) hoarseness. Neck: (-) swollen glands, (-) enlarged thyroid, (-) neck pain. Respiratory: - cough, (-) bloody sputum, + shortness of breath, - wheezing. Cardiovascular: - ankle swelling, (-) chest pain. Lymphatic: (-) lymph node enlargement. Neurologic: (-) numbness, (-) tingling. Psychiatric: (-) anxiety, (-) depression   Current Medication: Outpatient Encounter Medications as of 03/29/2023  Medication Sig   ALPRAZolam (XANAX) 0.25 MG tablet Take 1 tablet (0.25 mg total) by mouth 2 (two) times daily as needed for anxiety.   amLODipine (NORVASC) 5 MG tablet Take 5 mg by mouth in the morning and at bedtime.   atorvastatin (LIPITOR) 40 MG tablet TAKE 1 TABLET BY MOUTH EVERY NIGHT AT BEDTIME    BREZTRI AEROSPHERE 160-9-4.8 MCG/ACT AERO INHALE 2 PUFFS INTO THE LUNGS TWICE DAILY   bumetanide (BUMEX) 1 MG tablet Take 2 tablets (2 mg total) by mouth 2 (two) times daily. And additional 2 tablets PM PRN   Calcium Carbonate-Vitamin D (CALCIUM 600+D PO) Take 1 tablet by mouth daily.   carvedilol (COREG) 25 MG tablet Take 25 mg by mouth 2 (two) times daily with a meal.   chlorpheniramine-HYDROcodone (TUSSIONEX) 10-8 MG/5ML Take 5 mLs by mouth every 12 (twelve) hours as needed.   Coenzyme Q10 (COQ10) 200 MG CAPS Take 200 mg by mouth daily.   diclofenac Sodium (VOLTAREN) 1 % GEL Apply 2 g topically 4 (four) times daily.   ELIQUIS 5 MG TABS tablet TAKE 1 TABLET BY MOUTH TWICE DAILY   ferrous sulfate 325 (65 FE) MG tablet Take 1 tablet (325 mg total) by mouth daily with breakfast.   fluticasone (FLONASE) 50 MCG/ACT nasal spray Place 2 sprays into both nostrils daily.   ipratropium-albuterol (DUONEB) 0.5-2.5 (3) MG/3ML SOLN Inhale 3 mLs into the lungs every 4 (four) hours as needed. Inhale 3 mls into the lungs every 4 to 6 hours and as needed   JARDIANCE 10 MG TABS tablet TAKE 1 TABLET(10 MG) BY MOUTH DAILY BEFORE BREAKFAST (Patient taking differently: Take 10 mg by mouth daily.)   levothyroxine (SYNTHROID) 25 MCG tablet TAKE 1 TABLET BY MOUTH DAILY BEFORE BREAKFAST   lisinopril (ZESTRIL) 40 MG tablet TAKE 1 TABLET(40 MG) BY MOUTH DAILY (Patient taking differently: Take 40 mg by mouth daily.)  loratadine (CLARITIN) 10 MG tablet Take 10 mg by mouth daily.   oxyCODONE-acetaminophen (PERCOCET) 10-325 MG tablet Take 1 tablet by mouth 2 (two) times daily as needed for pain.   pantoprazole (PROTONIX) 40 MG tablet TAKE 1 TABLET BY MOUTH EVERY DAY   potassium chloride (KLOR-CON) 10 MEQ tablet Take 10 mEq by mouth 2 (two) times daily.   revefenacin (YUPELRI) 175 MCG/3ML nebulizer solution Take 3 mLs (175 mcg total) by nebulization daily.   sertraline (ZOLOFT) 100 MG tablet TAKE 1 TABLET BY MOUTH ONCE A DAY  FOR ANXIETY (Patient taking differently: Take 100 mg by mouth daily. TAKE 1 TABLET BY MOUTH ONCE A DAY FOR ANXIETY)   sodium chloride (OCEAN) 0.65 % SOLN nasal spray Place 1 spray into both nostrils as needed for congestion.   traZODone (DESYREL) 50 MG tablet TAKE 1 TABLET BY MOUTH EVERY DAY AT BEDTIME   VENTOLIN HFA 108 (90 Base) MCG/ACT inhaler INHALE 2 PUFFS INTO THE LUNGS EVERY 6 HOURS AS NEEDED FOR WHEEZING OR SHORTNESS OF BREATH   No facility-administered encounter medications on file as of 03/29/2023.    Surgical History: Past Surgical History:  Procedure Laterality Date   ABDOMINAL AORTIC ANEURYSM REPAIR  2008   ABDOMINAL HYSTERECTOMY     AORTIC VALVE REPLACEMENT  2008   CARDIAC CATHETERIZATION     [facility]   CARDIOVERSION N/A 08/15/2018   Procedure: CARDIOVERSION;  Surgeon: Iran Ouch, MD;  Location: [facility] ORS;  Service: Cardiovascular;  Laterality: N/A;   CARDIOVERSION N/A 11/14/2018   Procedure: CARDIOVERSION (CATH LAB);  Surgeon: Iran Ouch, MD;  Location: [facility] ORS;  Service: Cardiovascular;  Laterality: N/A;   CARDIOVERSION N/A 06/05/2019   Procedure: CARDIOVERSION;  Surgeon: Iran Ouch, MD;  Location: [facility] ORS;  Service: Cardiovascular;  Laterality: N/A;   CARDIOVERSION N/A 08/14/2019   Procedure: CARDIOVERSION;  Surgeon: Iran Ouch, MD;  Location: [facility] ORS;  Service: Cardiovascular;  Laterality: N/A;   CARDIOVERSION N/A 11/09/2019   Procedure: CARDIOVERSION;  Surgeon: Marcina Millard, MD;  Location: [facility] ORS;  Service: Cardiovascular;  Laterality: N/A;   CARDIOVERSION N/A 01/17/2020   Procedure: CARDIOVERSION;  Surgeon: Marcina Millard, MD;  Location: [facility] ORS;  Service: Cardiovascular;  Laterality: N/A;   CARPAL TUNNEL RELEASE     ELECTROPHYSIOLOGIC STUDY N/A 08/17/2016   Procedure: Cardioversion;  Surgeon: Iran Ouch, MD;  Location: [facility] ORS;  Service: Cardiovascular;  Laterality: N/A;   TUMOR EXCISION Left    x3 (arm)    Medical  History: Past Medical History:  Diagnosis Date   Aortic 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 s/p bioprosthetic valve replacement 2008 at [facility];  b. 01/2015 Echo: EF 60-65%, no rwma, Gr1 DD, mildly dil LA, nl RV fxn.   Aspiration pneumonia ([name])    Atrial fibrillation with RVR ([name]) 01/11/2015   Atrial flutter ([name])    a. 08/2016 s/p DCCV.  Remains on flecainide 50 mg bid.   Basal skull fracture ([name]) 20 yrs ago   CHF (congestive heart failure) ([name])    Chronic respiratory failure ([name])    COPD (chronic obstructive pulmonary disease) ([name])    a. 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 2008 prior to Aortic aneurysm repair-->nl cors.   History of stress test    a. 10/2015 MV: no ischemia/infarct.  HLD (hyperlipidemia)    HTN (hypertension)    Hypothyroidism    Obesity    PAF (paroxysmal atrial fibrillation) ([name])    a. on Eliquis; b. CHADS2VASc = 3 (HTN, age x 1, female).   Paroxysmal atrial fibrillation ([name]) 01/24/2015   Right upper quadrant abdominal tenderness without rebound tenderness 03/02/2018   S/P ascending aortic aneurysm repair 2008    Family History: Family History  Problem Relation Age of Onset   Stroke Father    Stroke Paternal Grandmother     Social History: Social History   Socioeconomic History   Marital status: Divorced    Spouse name: Not on file   Number of children: Not on file   Years of education: Not on file   Highest education level: Not on file  Occupational History   Not on file  Tobacco Use   Smoking status: Former    Types: Cigarettes   Smokeless tobacco: Never  Vaping Use   Vaping status: Never Used  Substance and Sexual Activity   Alcohol use: No   Drug use: No   Sexual activity: Never    Birth control/protection: Surgical  Other Topics Concern   Not on file  Social History Narrative   Not on file    Social Determinants of Health   Financial Resource Strain: Low Risk  (11/03/2022)   Received from [facility]   Overall Financial Resource Strain (CARDIA)    Difficulty of Paying Living Expenses: Not hard at all  Recent Concern: Financial Resource Strain - Medium Risk (11/02/2022)   Received from [facility]   Overall Financial Resource Strain (CARDIA)    Difficulty of Paying Living Expenses: Somewhat hard  Food Insecurity: No Food Insecurity (02/02/2023)   Hunger Vital Sign    Worried About Running Out of Food in the Last Year: Never true    Ran Out of Food in the Last Year: Never true  Transportation Needs: No Transportation Needs (02/02/2023)   PRAPARE - Administrator, Civil Service (Medical): No    Lack of Transportation (Non-Medical): No  Physical Activity: Not on file  Stress: Not on file  Social Connections: Not on file  Intimate Partner Violence: Not At Risk (02/02/2023)   Humiliation, Afraid, Rape, and Kick questionnaire    Fear of Current or Ex-Partner: No    Emotionally Abused: No    Physically Abused: No    Sexually Abused: No    Vital Signs: Blood pressure 110/80, pulse 89, temperature 98.4 F (36.9 C), resp. rate 16, height 4\' 10"  (1.473 m), weight 182 lb (82.6 kg), SpO2 95%.  Examination: General Appearance: The patient is well-developed, well-nourished, and in no distress. Skin: Gross inspection of skin unremarkable. Head: normocephalic, no gross deformities. Eyes: no gross deformities noted. ENT: ears appear grossly normal no exudates. Neck: Supple. No thyromegaly. No LAD. Respiratory: no rhonchi noted. Cardiovascular: Normal S1 and S2 without murmur or rub. Extremities: No cyanosis. pulses are equal. Neurologic: Alert and oriented. No involuntary movements.  LABS: Recent Results (from the past 2160 hour(s))  Rad Onc Aria Session Summary     Status: None   Collection Time:  01/21/23  1:53 PM  Result Value Ref Range   Course ID [email or web address]    Course Intent Curative    Course Start Date 01/11/2023  4:57 PM    Session Number 1    Course First Treatment Date 01/21/2023  1:49 PM    Course  Last Treatment Date 01/21/2023  1:51 PM    Course Elapsed Days 0    Reference Point ID Rt Lung SBRT    Reference Point Dosage Given to Date 65.78469629 Gy   Reference Point Session Dosage Given 52.84132440 Gy   Plan ID [email or web address]    Plan Fractions Treated to Date 1    Plan Total Fractions Prescribed 5    Plan Prescribed Dose Per Fraction 12 Gy   Plan Total Prescribed Dose 60.000000 Gy   Plan Primary Reference Point Rt Lung SBRT   Rad Onc Aria Session Summary     Status: None   Collection Time: 01/25/23  2:51 PM  Result Value Ref Range   Course ID [email or web address]    Course Intent Curative    Course Start Date 01/11/2023  4:57 PM    Session Number 2    Course First Treatment Date 01/21/2023  1:49 PM    Course Last Treatment Date 01/25/2023  2:49 PM    Course Elapsed Days 4    Reference Point ID Rt Lung SBRT    Reference Point Dosage Given to Date 23.99999998 Gy   Reference Point Session Dosage Given 10.27253664 Gy   Plan ID [email or web address]    Plan Fractions Treated to Date 2    Plan Total Fractions Prescribed 5    Plan Prescribed Dose Per Fraction 12 Gy   Plan Total Prescribed Dose 60.000000 Gy   Plan Primary Reference Point Rt Lung SBRT   Rad Onc Aria Session Summary     Status: None   Collection Time: 01/27/23 11:46 AM  Result Value Ref Range   Course ID [email or web address]    Course Intent Curative    Course Start Date 01/11/2023  4:57 PM    Session Number 3    Course First Treatment Date 01/21/2023  1:49 PM    Course Last Treatment Date 01/27/2023 11:44 AM    Course Elapsed Days 6    Reference Point ID Rt Lung SBRT    Reference Point Dosage Given to Date 40.34742595 Gy   Reference Point Session Dosage Given 63.87564332 Gy   Plan ID [email or web address]    Plan Fractions Treated to Date 3    Plan  Total Fractions Prescribed 5    Plan Prescribed Dose Per Fraction 12 Gy   Plan Total Prescribed Dose 60.000000 Gy   Plan Primary Reference Point Rt Lung SBRT   CBC     Status: Abnormal   Collection Time: 02/01/23  8:48 PM  Result Value Ref Range   WBC 5.4 4.0 - 10.5 K/uL   RBC 4.00 3.87 - 5.11 MIL/uL   Hemoglobin 11.2 (L) 12.0 - 15.0 g/dL   HCT 95.1 88.4 - 16.6 %   MCV 90.8 80.0 - 100.0 fL   MCH 28.0 26.0 - 34.0 pg   MCHC 30.9 30.0 - 36.0 g/dL   RDW 06.3 (H) 01.6 - 01.0 %   Platelets 235 150 - 400 K/uL   nRBC 0.0 0.0 - 0.2 %    Comment: Performed at [facility], [address], [place], [place] [address]  Basic metabolic panel     Status: Abnormal   Collection Time: 02/01/23  8:48 PM  Result Value Ref Range   Sodium 140 135 - 145 mmol/L   Potassium 3.3 (L) 3.5 - 5.1 mmol/L   Chloride 98 98 - 111 mmol/L   CO2 31 22 - 32 mmol/L   Glucose, Bld 112 (H) 70 - 99 mg/dL  Comment: Glucose reference range applies only to samples taken after fasting for at least 8 hours.   BUN 18 8 - 23 mg/dL   Creatinine, Ser 1.61 0.44 - 1.00 mg/dL   Calcium 8.8 (L) 8.9 - 10.3 mg/dL   GFR, Estimated >09 >60 mL/min    Comment: (NOTE) Calculated using the CKD-EPI Creatinine Equation (2021)    Anion gap 11 5 - 15    Comment: Performed at [facility], [address], [place], [place] [address]  Troponin I (High Sensitivity)     Status: None   Collection Time: 02/01/23  8:48 PM  Result Value Ref Range   Troponin I (High Sensitivity) 15 <18 ng/L    Comment: (NOTE) Elevated high sensitivity troponin I (hsTnI) values and significant  changes across serial measurements may suggest ACS but many other  chronic and acute conditions are known to elevate hsTnI results.  Refer to the "Links" section for chest pain algorithms and additional  guidance. Performed at [facility], [address] [place], [place] [address]   Brain natriuretic peptide     Status: Abnormal    Collection Time: 02/01/23  8:48 PM  Result Value Ref Range   B Natriuretic Peptide 330.6 (H) 0.0 - 100.0 pg/mL    Comment: Performed at [facility], [address] [place], [place] [address]  Resp Panel by RT-PCR (Flu A&B, Covid) Anterior Nasal Swab     Status: None   Collection Time: 02/01/23  9:03 PM   Specimen: Anterior Nasal Swab  Result Value Ref Range   SARS Coronavirus 2 by RT PCR NEGATIVE NEGATIVE    Comment: (NOTE) SARS-CoV-2 target nucleic acids are NOT DETECTED.  The SARS-CoV-2 RNA is generally detectable in upper respiratory specimens during the acute phase of infection. The lowest concentration of SARS-CoV-2 viral copies this assay can detect is 138 copies/mL. A negative result does not preclude SARS-Cov-2 infection and should not be used as the sole basis for treatment or other patient management decisions. A negative result may occur with  improper specimen collection/handling, submission of specimen other than nasopharyngeal swab, presence of viral mutation(s) within the areas targeted by this assay, and inadequate number of viral copies(<138 copies/mL). A negative result must be combined with clinical observations, patient history, and epidemiological information. The expected result is Negative.  Fact Sheet for Patients:  BloggerCourse.com  Fact Sheet for Healthcare Providers:  SeriousBroker.it  This test is no t yet approved or cleared by the [place] FDA and  has been authorized for detection and/or diagnosis of SARS-CoV-2 by FDA under an Emergency Use Authorization (EUA). This EUA will remain  in effect (meaning this test can be used) for the duration of the COVID-19 declaration under Section 564(b)(1) of the Act, 21 U.S.C.section 360bbb-3(b)(1), unless the authorization is terminated  or revoked sooner.       Influenza A by PCR NEGATIVE NEGATIVE   Influenza B by PCR NEGATIVE NEGATIVE     Comment: (NOTE) The Xpert Xpress SARS-CoV-2/FLU/RSV plus assay is intended as an aid in the diagnosis of influenza from Nasopharyngeal swab specimens and should not be used as a sole basis for treatment. Nasal washings and aspirates are unacceptable for Xpert Xpress SARS-CoV-2/FLU/RSV testing.  Fact Sheet for Patients: BloggerCourse.com  Fact Sheet for Healthcare Providers: SeriousBroker.it  This test is not yet approved or cleared by the [place] FDA and has been authorized for detection and/or diagnosis of SARS-CoV-2 by FDA under an Emergency Use Authorization (EUA).  This EUA will remain in effect (meaning this test can be used) for the duration of the COVID-19 declaration under Section 564(b)(1) of the Act, 21 U.S.C. section 360bbb-3(b)(1), unless the authorization is terminated or revoked.  Performed at [facility], [address] [place], [place] [address]   Hepatic function panel     Status: None   Collection Time: 02/01/23 10:40 PM  Result Value Ref Range   Total Protein 6.9 6.5 - 8.1 g/dL   Albumin 3.7 3.5 - 5.0 g/dL   AST 20 15 - 41 U/L   ALT 17 0 - 44 U/L   Alkaline Phosphatase 80 38 - 126 U/L   Total Bilirubin 0.6 0.3 - 1.2 mg/dL   Bilirubin, Direct <6.0 0.0 - 0.2 mg/dL   Indirect Bilirubin NOT CALCULATED 0.3 - 0.9 mg/dL    Comment: Performed at [facility], [address] [place], [place] [address]  T4, free     Status: None   Collection Time: 02/01/23 10:40 PM  Result Value Ref Range   Free T4 1.02 0.61 - 1.12 ng/dL    Comment: (NOTE) Biotin ingestion may interfere with free T4 tests. If the results are inconsistent with the TSH level, previous test results, or the clinical presentation, then consider biotin interference. If needed, order repeat testing after stopping biotin. Performed at [facility], [address] [place], [place] [address]   TSH     Status: None    Collection Time: 02/01/23 10:40 PM  Result Value Ref Range   TSH 1.382 0.350 - 4.500 uIU/mL    Comment: Performed by a 3rd Generation assay with a functional sensitivity of <=0.01 uIU/mL. Performed at [facility], [address] [place], [place] [address]   Basic metabolic panel     Status: Abnormal   Collection Time: 02/02/23 11:53 AM  Result Value Ref Range   Sodium 136 135 - 145 mmol/L   Potassium 3.1 (L) 3.5 - 5.1 mmol/L   Chloride 95 (L) 98 - 111 mmol/L   CO2 29 22 - 32 mmol/L   Glucose, Bld 130 (H) 70 - 99 mg/dL    Comment: Glucose reference range applies only to samples taken after fasting for at least 8 hours.   BUN 20 8 - 23 mg/dL   Creatinine, Ser 5.62 0.44 - 1.00 mg/dL   Calcium 9.1 8.9 - 13.0 mg/dL   GFR, Estimated >86 >57 mL/min    Comment: (NOTE) Calculated using the CKD-EPI Creatinine Equation (2021)    Anion gap 12 5 - 15    Comment: Performed at [facility], [address] [place], [place] [address]  Magnesium     Status: None   Collection Time: 02/02/23 11:53 AM  Result Value Ref Range   Magnesium 2.2 1.7 - 2.4 mg/dL    Comment: Performed at [facility], [address] [place], [place] [address]  Basic metabolic panel     Status: Abnormal   Collection Time: 02/03/23  8:41 AM  Result Value Ref Range   Sodium 140 135 - 145 mmol/L   Potassium 3.3 (L) 3.5 - 5.1 mmol/L   Chloride 101 98 - 111 mmol/L   CO2 28 22 - 32 mmol/L   Glucose, Bld 91 70 - 99 mg/dL    Comment: Glucose reference range applies only to samples taken after fasting for at least 8 hours.   BUN 22 8 - 23 mg/dL   Creatinine, Ser 4.13 0.44 - 1.00 mg/dL  Calcium 8.9 8.9 - 10.3 mg/dL   GFR, Estimated >95 >28 mL/min    Comment: (NOTE) Calculated using the CKD-EPI Creatinine Equation (2021)    Anion gap 11 5 - 15    Comment: Performed at [facility], [address] [place], [place] [address]  Magnesium     Status: None   Collection Time: 02/03/23   8:41 AM  Result Value Ref Range   Magnesium 2.4 1.7 - 2.4 mg/dL    Comment: Performed at [facility], [address] [place], [place] [address]  Basic metabolic panel     Status: Abnormal   Collection Time: 02/04/23  9:35 AM  Result Value Ref Range   Sodium 136 135 - 145 mmol/L   Potassium 4.0 3.5 - 5.1 mmol/L   Chloride 98 98 - 111 mmol/L   CO2 27 22 - 32 mmol/L   Glucose, Bld 145 (H) 70 - 99 mg/dL    Comment: Glucose reference range applies only to samples taken after fasting for at least 8 hours.   BUN 18 8 - 23 mg/dL   Creatinine, Ser 7.25 0.44 - 1.00 mg/dL   Calcium 8.8 (L) 8.9 - 10.3 mg/dL   GFR, Estimated >36 >64 mL/min    Comment: (NOTE) Calculated using the CKD-EPI Creatinine Equation (2021)    Anion gap 11 5 - 15    Comment: Performed at [facility], [address] [place], [place] [address]  Respiratory (~20 pathogens) panel by PCR     Status: Abnormal   Collection Time: 02/04/23  2:30 PM   Specimen: Nasopharyngeal Swab; Respiratory  Result Value Ref Range   Adenovirus NOT DETECTED NOT DETECTED   Coronavirus 229E NOT DETECTED NOT DETECTED    Comment: (NOTE) The Coronavirus on the Respiratory Panel, DOES NOT test for the novel  Coronavirus (2019 nCoV)    Coronavirus HKU1 NOT DETECTED NOT DETECTED   Coronavirus NL63 NOT DETECTED NOT DETECTED   Coronavirus OC43 NOT DETECTED NOT DETECTED   Metapneumovirus DETECTED (A) NOT DETECTED   Rhinovirus / Enterovirus NOT DETECTED NOT DETECTED   Influenza A NOT DETECTED NOT DETECTED   Influenza B NOT DETECTED NOT DETECTED   Parainfluenza Virus 1 NOT DETECTED NOT DETECTED   Parainfluenza Virus 2 NOT DETECTED NOT DETECTED   Parainfluenza Virus 3 NOT DETECTED NOT DETECTED   Parainfluenza Virus 4 NOT DETECTED NOT DETECTED   Respiratory Syncytial Virus NOT DETECTED NOT DETECTED   Bordetella pertussis NOT DETECTED NOT DETECTED   Bordetella Parapertussis NOT DETECTED NOT DETECTED   Chlamydophila pneumoniae  NOT DETECTED NOT DETECTED   Mycoplasma pneumoniae NOT DETECTED NOT DETECTED    Comment: Performed at [facility], [address], [place], [place] [address]  Basic metabolic panel     Status: Abnormal   Collection Time: 02/05/23  4:04 AM  Result Value Ref Range   Sodium 138 135 - 145 mmol/L   Potassium 4.3 3.5 - 5.1 mmol/L   Chloride 101 98 - 111 mmol/L   CO2 30 22 - 32 mmol/L   Glucose, Bld 97 70 - 99 mg/dL    Comment: Glucose reference range applies only to samples taken after fasting for at least 8 hours.   BUN 18 8 - 23 mg/dL   Creatinine, Ser 6.38 0.44 - 1.00 mg/dL   Calcium 8.5 (L) 8.9 - 10.3 mg/dL   GFR, Estimated >75 >64 mL/min    Comment: (NOTE) Calculated using the CKD-EPI Creatinine Equation (2021)  Anion gap 7 5 - 15    Comment: Performed at [facility], [address] [place], [place] [address]  Basic metabolic panel     Status: Abnormal   Collection Time: 02/06/23  4:21 AM  Result Value Ref Range   Sodium 138 135 - 145 mmol/L   Potassium 4.1 3.5 - 5.1 mmol/L   Chloride 98 98 - 111 mmol/L   CO2 31 22 - 32 mmol/L   Glucose, Bld 103 (H) 70 - 99 mg/dL    Comment: Glucose reference range applies only to samples taken after fasting for at least 8 hours.   BUN 17 8 - 23 mg/dL   Creatinine, Ser 6.04 0.44 - 1.00 mg/dL   Calcium 8.8 (L) 8.9 - 10.3 mg/dL   GFR, Estimated >54 >09 mL/min    Comment: (NOTE) Calculated using the CKD-EPI Creatinine Equation (2021)    Anion gap 9 5 - 15    Comment: Performed at [facility], [address] [place], [place] [address]  Rad Jeralene Huff Session Summary     Status: None   Collection Time: 03/04/23 10:55 AM  Result Value Ref Range   Course ID [email or web address]    Course Intent Curative    Course Start Date 01/11/2023  4:57 PM    Session Number 4    Course First Treatment Date 01/21/2023  1:49 PM    Course Last Treatment Date 03/04/2023 10:53 AM    Course Elapsed Days 42    Reference Point ID Rt Lung SBRT     Reference Point Dosage Given to Date 47.82956213 Gy   Reference Point Session Dosage Given 08.65784696 Gy   Plan ID Lung_[email or web address]    Plan Fractions Treated to Date 4    Plan Total Fractions Prescribed 5    Plan Prescribed Dose Per Fraction 12 Gy   Plan Total Prescribed Dose 60.000000 Gy   Plan Primary Reference Point Rt Lung SBRT   Basic metabolic panel     Status: Abnormal   Collection Time: 03/06/23  8:32 PM  Result Value Ref Range   Sodium 137 135 - 145 mmol/L   Potassium 3.7 3.5 - 5.1 mmol/L   Chloride 101 98 - 111 mmol/L   CO2 26 22 - 32 mmol/L   Glucose, Bld 119 (H) 70 - 99 mg/dL    Comment: Glucose reference range applies only to samples taken after fasting for at least 8 hours.   BUN 14 8 - 23 mg/dL   Creatinine, Ser 2.95 0.44 - 1.00 mg/dL   Calcium 9.1 8.9 - 28.4 mg/dL   GFR, Estimated >13 >24 mL/min    Comment: (NOTE) Calculated using the CKD-EPI Creatinine Equation (2021)    Anion gap 10 5 - 15    Comment: Performed at [facility], [address], [place], [place] [address]  Troponin I (High Sensitivity)     Status: None   Collection Time: 03/06/23  8:32 PM  Result Value Ref Range   Troponin I (High Sensitivity) 13 <18 ng/L    Comment: (NOTE) Elevated high sensitivity troponin I (hsTnI) values and significant  changes across serial measurements may suggest ACS but many other  chronic and acute conditions are known to elevate hsTnI results.  Refer to the "Links" section for chest pain algorithms and additional  guidance. Performed at [facility], [address] [place], [place] [address]   Rad Jeralene Huff Session Summary     Status: None   Collection Time:  03/09/23 11:22 AM  Result Value Ref Range   Course ID [id number]    Course Intent Curative    Course Start Date 01/11/2023  4:57 PM    Session Number 5    Course First Treatment Date 01/21/2023  1:49 PM    Course Last Treatment Date 03/09/2023 11:11 AM    Course Elapsed Days 47    Reference Point  ID Rt Lung SBRT    Reference Point Dosage Given to Date 16.1096045 Gy   Reference Point Session Dosage Given 4.09811914 Gy   Plan ID Lung_[email or web address]    Plan Fractions Treated to Date 5    Plan Total Fractions Prescribed 5    Plan Prescribed Dose Per Fraction 12 Gy   Plan Total Prescribed Dose 60.000000 Gy   Plan Primary Reference Point Rt Lung SBRT     Radiology: CT Chest High Resolution  Result Date: 03/18/2023 CLINICAL DATA:  77 year old female with history of chronic cough for the past several months. History of right upper lobe lung cancer. * Tracking Code: BO * EXAM: CT CHEST WITHOUT CONTRAST TECHNIQUE: Multidetector CT imaging of the chest was performed following the standard protocol without intravenous contrast. High resolution imaging of the lungs, as well as inspiratory and expiratory imaging, was performed. RADIATION DOSE REDUCTION: This exam was performed according to the departmental dose-optimization program which includes automated exposure control, adjustment of the mA and/or kV according to patient size and/or use of iterative reconstruction technique. COMPARISON:  PET-CT 06/29/2022. High-resolution chest CT 06/02/2022. FINDINGS: Cardiovascular: Heart size is normal. There is no significant pericardial fluid, thickening or pericardial calcification. There is aortic atherosclerosis, as well as atherosclerosis of the great vessels of the mediastinum and the coronary arteries, including calcified atherosclerotic plaque in the left anterior descending and left circumflex coronary arteries. Status post TAVR. Left-sided pacemaker device in place with lead tips terminating in the right atrium and right ventricular apex. Mediastinum/Nodes: No pathologically enlarged mediastinal or hilar lymph nodes. Please note that accurate exclusion of hilar adenopathy is limited on noncontrast CT scans. Esophagus is unremarkable in appearance. No axillary lymphadenopathy. Lungs/Pleura: Previously noted right  upper lobe pulmonary nodule appears enlarged when compared to the prior study (axial image 25 of series 8 and coronal image 72 of series 4) estimated to measure approximately 1.1 x 1.2 x 1.6 cm on today's examination, with irregular macrolobulated and spiculated margins, including spiculation which extends to the pleura superiorly where the pleura appears mildly thickened. Persistent linear architectural distortion and volume loss in the posteromedial aspect of the right lower lobe, similar to the prior study, favored to reflect chronic post infectious or inflammatory scarring. Some nonspecific ground-glass attenuation is noted in the posterolateral aspect of the left upper lobe, also stable compared to the prior examinations. No generalized areas of septal thickening, subpleural reticulation, traction bronchiectasis or honeycombing are noted to suggest interstitial lung disease. Inspiratory and expiratory imaging demonstrates some mild air trapping indicative of small airways disease. There is also diffuse bronchial wall thickening with moderate centrilobular and paraseptal emphysema. Upper Abdomen: Atherosclerosis in the abdominal aorta. Numerous calcified gallstones lying dependently in the visualized gallbladder. Musculoskeletal: Median sternotomy wires. There are no aggressive appearing lytic or blastic lesions noted in the visualized portions of the skeleton. IMPRESSION: 1. No findings to suggest interstitial lung disease. 2. Interval enlargement of previously noted right upper lobe pulmonary nodule which has a very aggressive appearance, highly concerning for primary bronchogenic neoplasm. This currently measures 1.1 x 1.2 x 1.6 cm and  has spiculations extending to the overlying pleura superiorly which appears mildly thickened. 3. Diffuse bronchial wall thickening with moderate centrilobular and paraseptal emphysema; imaging findings suggestive of underlying COPD. 4. Mild air trapping indicative of small  airways disease. 5. Aortic atherosclerosis, in addition to 2 vessel coronary artery disease. Assessment for potential risk factor modification, dietary therapy or pharmacologic therapy may be warranted, if clinically indicated. 6. Cholelithiasis. 7. Additional incidental findings, similar to prior studies, as above. Aortic Atherosclerosis (ICD10-I70.0) and Emphysema (ICD10-J43.9). Electronically Signed   By: Trudie Reed M.D.   On: 03/18/2023 05:22    No results found.  CT Chest High Resolution  Result Date: 03/18/2023 CLINICAL DATA:  77 year old female with history of chronic cough for the past several months. History of right upper lobe lung cancer. * Tracking Code: BO * EXAM: CT CHEST WITHOUT CONTRAST TECHNIQUE: Multidetector CT imaging of the chest was performed following the standard protocol without intravenous contrast. High resolution imaging of the lungs, as well as inspiratory and expiratory imaging, was performed. RADIATION DOSE REDUCTION: This exam was performed according to the departmental dose-optimization program which includes automated exposure control, adjustment of the mA and/or kV according to patient size and/or use of iterative reconstruction technique. COMPARISON:  PET-CT 06/29/2022. High-resolution chest CT 06/02/2022. FINDINGS: Cardiovascular: Heart size is normal. There is no significant pericardial fluid, thickening or pericardial calcification. There is aortic atherosclerosis, as well as atherosclerosis of the great vessels of the mediastinum and the coronary arteries, including calcified atherosclerotic plaque in the left anterior descending and left circumflex coronary arteries. Status post TAVR. Left-sided pacemaker device in place with lead tips terminating in the right atrium and right ventricular apex. Mediastinum/Nodes: No pathologically enlarged mediastinal or hilar lymph nodes. Please note that accurate exclusion of hilar adenopathy is limited on noncontrast CT scans.  Esophagus is unremarkable in appearance. No axillary lymphadenopathy. Lungs/Pleura: Previously noted right upper lobe pulmonary nodule appears enlarged when compared to the prior study (axial image 25 of series 8 and coronal image 72 of series 4) estimated to measure approximately 1.1 x 1.2 x 1.6 cm on today's examination, with irregular macrolobulated and spiculated margins, including spiculation which extends to the pleura superiorly where the pleura appears mildly thickened. Persistent linear architectural distortion and volume loss in the posteromedial aspect of the right lower lobe, similar to the prior study, favored to reflect chronic post infectious or inflammatory scarring. Some nonspecific ground-glass attenuation is noted in the posterolateral aspect of the left upper lobe, also stable compared to the prior examinations. No generalized areas of septal thickening, subpleural reticulation, traction bronchiectasis or honeycombing are noted to suggest interstitial lung disease. Inspiratory and expiratory imaging demonstrates some mild air trapping indicative of small airways disease. There is also diffuse bronchial wall thickening with moderate centrilobular and paraseptal emphysema. Upper Abdomen: Atherosclerosis in the abdominal aorta. Numerous calcified gallstones lying dependently in the visualized gallbladder. Musculoskeletal: Median sternotomy wires. There are no aggressive appearing lytic or blastic lesions noted in the visualized portions of the skeleton. IMPRESSION: 1. No findings to suggest interstitial lung disease. 2. Interval enlargement of previously noted right upper lobe pulmonary nodule which has a very aggressive appearance, highly concerning for primary bronchogenic neoplasm. This currently measures 1.1 x 1.2 x 1.6 cm and has spiculations extending to the overlying pleura superiorly which appears mildly thickened. 3. Diffuse bronchial wall thickening with moderate centrilobular and  paraseptal emphysema; imaging findings suggestive of underlying COPD. 4. Mild air trapping indicative of small airways disease. 5. Aortic  atherosclerosis, in addition to 2 vessel coronary artery disease. Assessment for potential risk factor modification, dietary therapy or pharmacologic therapy may be warranted, if clinically indicated. 6. Cholelithiasis. 7. Additional incidental findings, similar to prior studies, as above. Aortic Atherosclerosis (ICD10-I70.0) and Emphysema (ICD10-J43.9). Electronically Signed   By: Trudie Reed M.D.   On: 03/18/2023 05:22   DG Chest 2 View  Result Date: 03/06/2023 CLINICAL DATA:  Chest pain EXAM: CHEST - 2 VIEW COMPARISON:  02/22/2023 FINDINGS: Post sternotomy changes and valve prosthesis. Left-sided pacing device. Cardiomegaly with vascular congestion and mild diffuse interstitial opacity likely low-grade edema. Probable trace pleural effusions. Underlying chronic interstitial disease at the bases IMPRESSION: Cardiomegaly with vascular congestion and mild interstitial edema superimposed on underlying chronic interstitial changes at the bases. Electronically Signed   By: Jasmine Pang M.D.   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   CHF (congestive heart failure), NYHA class IV, chronic, diastolic ([name]) 10/22/2022   On home oxygen therapy 10/22/2022   Hypokalemia 08/04/2022   Chronic upper back pain 04/14/2022   Chronic myofascial pain 04/14/2022   Musculoskeletal disorder involving upper trapezius muscle 04/14/2022   Abnormal CT scan, cervical spine (03/04/2022) 04/01/2022   Trigger point of  shoulder region (Bilateral) 04/01/2022   Trigger point with neck pain 04/01/2022   DISH (diffuse idiopathic skeletal hyperostosis) 03/12/2022   Atrial fibrillation, chronic ([name]) 01/26/2022   Morbid obesity ([name]) 01/26/2022   Chronic respiratory failure with hypoxia ([name]) 01/26/2022   Bilateral pneumonia 01/25/2022   Chronic intractable headache 01/07/2022   Chronic diastolic CHF (congestive heart failure) ([name]) 12/02/2021   Hyperlipidemia 12/02/2021   Stroke ([name]) 12/02/2021   Iron deficiency anemia 12/02/2021   Depression 12/02/2021   Acute on chronic respiratory failure with hypoxia ([name]) 11/11/2021   Unable to maintain body in lying position 10/16/2021   Orthopnea 10/16/2021   Class 2 obesity with alveolar hypoventilation, serious comorbidity, and body mass index (BMI) of 39.0 to 39.9 in adult ([name]) 10/16/2021   At high risk for postoperative complications 10/16/2021   Hearing loss 08/12/2021   Long term prescription benzodiazepine use 04/30/2021   Chronic shoulder pain (1ry area of Pain) (Bilateral) (L>R) 04/30/2021   Chronic upper extremity pain (2ry area of Pain) (Bilateral) (L>R) 04/30/2021   Cervicalgia 04/30/2021   Chronic neck pain (3ry area of Pain) (Posterior) (Bilateral) (L>R) 04/30/2021   Shoulder blade pain (4th area of Pain) (Left) 04/30/2021   Osteoarthritis of glenohumeral joint (Left) 04/30/2021   Osteoarthritis of AC (acromioclavicular) joint (Left) 04/30/2021   Osteoarthritis of glenohumeral joints (Bilateral) 04/30/2021   Osteoarthritis of acromioclavicular joints (Bilateral) 04/30/2021   Primary osteoarthritis of shoulders (Bilateral) 04/30/2021   DDD (degenerative disc disease), cervical 04/30/2021   Cervical radiculitis (Left) 04/30/2021   Cervical radiculopathy (Left) 04/30/2021   C6 radiculopathy (Left) 04/30/2021   C7 radiculopathy (Left) 04/30/2021   Wheelchair dependence 04/30/2021   Chronic pain syndrome 04/29/2021   Pharmacologic therapy  04/29/2021   Disorder of skeletal system 04/29/2021   Problems influencing health status 04/29/2021   Chronic anticoagulation (Eliquis) 03/14/2021   Hx of atrioventricular node ablation 03/14/2021  Symptomatic anemia 01/29/2021   Heart failure due to valvular disease, acute on chronic, diastolic ([name]) 05/11/2020   Chronic obstructive pulmonary disease (COPD) ([name])    Hypothyroidism    Cardiac pacemaker in situ 05/10/2020   Acute non-recurrent frontal sinusitis 04/22/2020   Vertigo 04/22/2020   S/P placement of cardiac pacemaker 02/21/2020   Atrial fibrillation status post cardioversion ([name]) 11/16/2019   Episode of moderate major depression ([name]) 10/10/2019   Persistent atrial fibrillation ([name])    Encounter for general adult medical examination with abnormal findings 07/10/2019   Encounter for screening mammogram for malignant neoplasm of breast 07/10/2019   Atopic dermatitis 05/02/2019   Paroxysmal atrial flutter ([name]) 08/11/2018   Encounter for long-term (current) use of medications 07/09/2018   Chronic left shoulder pain 07/09/2018   Conjunctivitis 07/09/2018   Oxygen dependent 07/09/2018   GAD (generalized anxiety disorder) 07/09/2018   Ovarian failure 07/09/2018   Positive colorectal cancer screening using Cologuard test 04/24/2018   Calculus of gallbladder with acute on chronic cholecystitis 04/08/2018   H/O: CVA (cerebrovascular accident) 04/08/2018   Dysuria 03/02/2018   Right upper quadrant abdominal tenderness without rebound tenderness 03/02/2018   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 exacerbation 03/02/2018   Chronic obstructive pulmonary disease ([name]) 09/19/2017   Typical atrial flutter ([name])    Irritable bowel syndrome without diarrhea 01/29/2015   Hypertension 01/24/2015   Paroxysmal atrial fibrillation ([name]) 01/24/2015   History of aortic valve replacement with bioprosthetic valve 01/24/2015   Atrial fibrillation with RVR ([name]) 01/11/2015   Degeneration of  intervertebral disc of mid-cervical region 05/18/2014   Shoulder impingement syndrome (Left) 05/18/2014   Cellulitis and abscess 02/13/2014   MRSA (methicillin resistant Staphylococcus aureus) 02/13/2014   Aneurysm, ascending aorta ([name]) 06/23/2013   Bicuspid aortic valve 06/23/2013    1. Malignant neoplasm of right upper lobe of lung [facility]) Patient had been deemed as nonresectable.  She has been to Dr. Aggie Cosier for radiation therapy.  I think she needs to go back to oncology to see what else can be offered. - Ambulatory referral to Hematology / Oncology  2. Chronic respiratory failure with hypoxia ([name]) She will continue with her oxygen therapy as prescribed.  Will continue supportive care  3. Chronic diastolic CHF (congestive heart failure) ([name]) This is compensated monitor fluid status diurese as tolerated  4. Chronic right shoulder pain Unclear etiology follow-up with PCP not sure if there could be any metastatic disease  5. GAD (generalized anxiety disorder) She needs a refill of her medications for anxiety she has been under good control on all medications - ALPRAZolam (XANAX) 0.25 MG tablet; Take 1 tablet (0.25 mg total) by mouth 2 (two) times daily as needed for anxiety.  Dispense: 60 tablet; Refill: 1    General Counseling: I have discussed the findings of the evaluation and examination with Shakera.  I have also discussed any further diagnostic evaluation thatmay be needed or ordered today. Mairi verbalizes understanding of the findings of todays visit. We also reviewed her medications today and discussed drug interactions and side effects including but not limited excessive drowsiness and altered mental states. We also discussed that there is always a risk not just to her but also people around her. she has been encouraged to call the office with any questions or concerns that should arise related to todays visit.  No orders of the defined types were placed in this encounter.     Time spent: 33  I have personally obtained a history, examined the patient,  evaluated laboratory and imaging results, formulated the assessment and plan and placed orders.    Yevonne Pax, MD [facility] Pulmonary and Critical Care Sleep medicine

## 2023-04-01 NOTE — Progress Notes (Signed)
PROVIDER NOTE: Information contained herein reflects review and annotations entered in association with encounter. Interpretation of such information and data should be left to medically-trained personnel. Information provided to patient can be located elsewhere in the medical record under "Patient Instructions". Document created using STT-dictation technology, any transcriptional errors that may result from process are unintentional.    Patient: [name]  Service Category: E/M  Provider: Oswaldo Done, MD  DOB: Oct 24, 1945  DOS: 04/05/2023  Referring Provider: Lyndon Code, MD  MRN: [id number]  Specialty: Interventional Pain Management  PCP: Lyndon Code, MD  Type: Established Patient  Setting: Ambulatory outpatient    Location: Office  Delivery: Face-to-face     HPI  [name], a 77 y.o. year old female, is here today because of her Trigger point with neck pain [M54.2]. 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; Shoulder impingement syndrome (Left); Chronic left shoulder pain; Chronic pain syndrome; Chronic shoulder pain (1ry area of Pain) (Bilateral) (L>R); Chronic upper extremity pain (2ry area of Pain) (Bilateral) (L>R); Cervicalgia; Chronic neck pain (3ry area of Pain) (Posterior) (Bilateral) (L>R); Shoulder blade pain (4th area of Pain) (Left); Osteoarthritis of glenohumeral joint (Left); Osteoarthritis of AC (acromioclavicular) joint (Left); Osteoarthritis of glenohumeral joints (Bilateral); Osteoarthritis of acromioclavicular joints (Bilateral); Primary osteoarthritis of shoulders (Bilateral); DDD (degenerative disc disease), cervical; Cervical radiculitis (Left); Cervical radiculopathy (Left); C6 radiculopathy (Left); C7 radiculopathy (Left); Unable to maintain body in lying position; Chronic intractable headache; DISH (diffuse idiopathic skeletal hyperostosis);  Abnormal CT scan, cervical spine (03/04/2022); Trigger point of shoulder region (Bilateral); Trigger point with neck pain; Chronic upper back pain; Chronic myofascial pain; and Musculoskeletal disorder involving upper trapezius muscle on their pertinent problem list. Pain Assessment: Severity of Chronic pain is reported as a 8 /10. Location: Shoulder Left/radiates into left arm. Onset: More than a month ago. Quality: Constant, Discomfort, Sharp, Throbbing. Timing: Constant. Modifying factor(s): meds. Vitals:  height is 4\' 10"  (1.473 m) and weight is 182 lb (82.6 kg). Her temperature is 97 F (36.1 C) (abnormal). Her blood pressure is 120/64 and her pulse is 73. Her respiration is 15 and oxygen saturation is 95%.  BMI: Estimated body mass index is 38.04 kg/m as calculated from the following:   Height as of this encounter: 4\' 10"  (1.473 m).   Weight as of this encounter: 182 lb (82.6 kg). Last encounter: 03/08/2023. Last procedure: 03/16/2023.  Reason for encounter: post-procedure evaluation and assessment. The patient indicated that immediately after the local anesthetic wore off, she had a flareup of the pain in the left arm, but after that it went away and she had 100% relief of the pain that lasted for approximately 27 days.  The pain has began to slowly come back, but it still not full force.  She is aware that we need to put some space between steroid injections.  She is aware that they minimum that will like to space time is about 2 months apart.  Right now she states the pain to be under control and does not need anything else.  The patient was encouraged to give [place] a call when and if she needs this again.  We will continue to see her on a PRN basis.  Post-procedure evaluation   Type: Trapezius and supraspinatus muscle Trigger Point Injection (Myoneural Block) (1-2 muscle groups)  #3  CPT: 20552 Laterality: Bilateral (-50)   Imaging: N/A. Landmark-guided  Anesthesia: Local anesthesia  (1-2% Lidocaine) Anxiolysis: None                 Sedation: No Sedation                       DOS: 03/16/2023  Performed by: Oswaldo Done, MD  Medical Necessity (reasoning)  Purpose: Diagnostic/Therapeutic Rationale (medical necessity): procedure needed and proper for the diagnosis and/or treatment of [name]'s medical symptoms and needs. Indications: Upper back pain severe enough to impact quality of life and/or function. 1. Musculoskeletal disorder involving upper trapezius muscle   2. Chronic myofascial pain   3. Chronic neck pain (3ry area of Pain) (Posterior) (Bilateral) (L>R)   4. Chronic shoulder pain (1ry area of Pain) (Bilateral) (L>R)   5. Trigger point of shoulder region, unspecified laterality   6. Trigger point with neck pain   7. Chronic anticoagulation (Eliquis)    NAS-11 Pain score:   Pre-procedure: 10-Worst pain ever/10   Post-procedure: 5 /10       Effectiveness:  Initial hour after procedure: 100 %. Subsequent 4-6 hours post-procedure: 100 %. Analgesia past initial 6 hours: 100 % (lasting 27 days). Ongoing improvement:  Analgesic: The patient indicated that immediately after the local anesthetic wore off, she had a flareup of the pain in the left arm, but after that it went away and she had 100% relief of the pain that lasted for approximately 27 days.  The pain has began to slowly come back, but it still not full force. Function: [name] reports improvement in function ROM: [name] reports improvement in ROM  Pharmacotherapy Assessment  Analgesic: No chronic opioid analgesics therapy prescribed by our practice. Oxycodone/APAP 10-325, 1 tab p.o. BID by Andrea Crown, FNP-C, working for Dr. Lillia Carmel, MD. MME/day: 30 mg/day   Monitoring: [place] PMP: PDMP reviewed during this encounter.       Pharmacotherapy: No side-effects or adverse reactions reported. Compliance: No problems identified. Effectiveness: Clinically  acceptable.  [name], RN  04/05/2023  1:50 PM  Sign when Signing Visit Safety precautions to be maintained throughout the outpatient stay will include: orient to surroundings, keep bed in low position, maintain call bell within reach at all times, provide assistance with transfer out of bed and ambulation.    No results found for: "CBDTHCR" No results found for: "D8THCCBX" No results found for: "D9THCCBX"  UDS:  Summary  Date Value Ref Range Status  04/30/2021 Note  Final    Comment:    ==================================================================== Compliance Drug Analysis, Ur ==================================================================== Test                             Result       Flag       Units  Drug Present and Declared for Prescription Verification   Alpha-hydroxyalprazolam        107          EXPECTED   ng/mg creat    Alpha-hydroxyalprazolam is an expected metabolite of alprazolam.    Source of alprazolam is a scheduled prescription medication.    Oxycodone                      1371         EXPECTED   ng/mg creat   Oxymorphone  600          EXPECTED   ng/mg creat   Noroxycodone                   2311         EXPECTED   ng/mg creat    Sources of oxycodone include scheduled prescription medications.    Oxymorphone and noroxycodone are expected metabolites of oxycodone.    Oxymorphone is also available as a scheduled prescription medication.    Sertraline                     PRESENT      EXPECTED   Desmethylsertraline            PRESENT      EXPECTED    Desmethylsertraline is an expected metabolite of sertraline.    Trazodone                      PRESENT      EXPECTED   1,3 chlorophenyl piperazine    PRESENT      EXPECTED    1,3-chlorophenyl piperazine is an expected metabolite of trazodone.    Acetaminophen                  PRESENT      EXPECTED  Drug Absent but Declared for Prescription Verification   Diclofenac                      Not Detected UNEXPECTED    Diclofenac, as indicated in the declared medication list, is not    always detected even when used as directed.  ==================================================================== Test                      Result    Flag   Units      Ref Range   Creatinine              45               mg/dL      >=08 ==================================================================== Declared Medications:  The flagging and interpretation on this report are based on the  following declared medications.  Unexpected results may arise from  inaccuracies in the declared medications.   **Note: The testing scope of this panel includes these medications:   Alprazolam (Xanax)  Oxycodone (Percocet)  Sertraline (Zoloft)  Trazodone (Desyrel)   **Note: The testing scope of this panel does not include small to  moderate amounts of these reported medications:   Acetaminophen (Tylenol)  Acetaminophen (Percocet)  Diclofenac (Voltaren)   **Note: The testing scope of this panel does not include the  following reported medications:   Albuterol (Ventolin HFA)  Apixaban (Eliquis)  Atorvastatin (Lipitor)  Bumetanide (Bumex)  Calcium  Flecainide (Tambocor)  Fluticasone (Trelegy)  Iron  Levothyroxine (Synthroid)  Lisinopril (Zestril)  Loratadine (Claritin)  Pantoprazole (Protonix)  Potassium (Klor-Con)  Ubiquinone (CoQ10)  Umeclidinium (Trelegy)  Vilanterol (Trelegy)  Vitamin D ==================================================================== For clinical consultation, please call [phone]. ====================================================================       ROS  Constitutional: Denies any fever or chills Gastrointestinal: No reported hemesis, hematochezia, vomiting, or acute GI distress Musculoskeletal: Denies any acute onset joint swelling, redness, loss of ROM, or weakness Neurological: No reported episodes of acute onset apraxia, aphasia, dysarthria,  agnosia, amnesia, paralysis, loss of coordination, or loss of consciousness  Medication Review  ALPRAZolam, Budeson-Glycopyrrol-Formoterol, Calcium Carbonate-Vitamin D, CoQ10, albuterol, amLODipine,  apixaban, atorvastatin, bumetanide, carvedilol, diclofenac Sodium, empagliflozin, ferrous sulfate, fluticasone, ipratropium-albuterol, levothyroxine, lisinopril, loratadine, oxyCODONE-acetaminophen, pantoprazole, potassium chloride, revefenacin, sertraline, sodium chloride, and traZODone  History Review  Allergy: Ms. Prezioso is allergic to benadryl [diphenhydramine hcl (sleep)], cetirizine, lasix [furosemide], levaquin [levofloxacin in d5w], meloxicam, and sulfa antibiotics. Drug: Ms. Dokken  reports no history of drug use. Alcohol:  reports no history of alcohol use. Tobacco:  reports that she has quit smoking. Her smoking use included cigarettes. She has never used smokeless tobacco. Social: Ms. Marksberry  reports that she has quit smoking. Her smoking use included cigarettes. She has never used smokeless tobacco. She reports that she does not drink alcohol and does not use drugs. Medical:  has a past medical history of Aortic stenosis due to bicuspid aortic valve, Aspiration pneumonia ([name]), Atrial fibrillation with RVR ([name]) (01/11/2015), Atrial flutter ([name]), Basal skull fracture ([name]) (20 yrs ago), CHF (congestive heart failure) ([name]), Chronic respiratory failure ([name]), COPD (chronic obstructive pulmonary disease) ([name]), Deafness in left ear, Essential hypertension (01/24/2015), History of cardiac cath, History of stress test, HLD (hyperlipidemia), HTN (hypertension), Hypothyroidism, Obesity, PAF (paroxysmal atrial fibrillation) ([name]), Paroxysmal atrial fibrillation ([name]) (01/24/2015), Right upper quadrant abdominal tenderness without rebound tenderness (03/02/2018), and S/P ascending aortic aneurysm repair (2008). Surgical: Ms. Albin  has a past surgical history that includes Abdominal aortic aneurysm repair  (2008); Aortic valve replacement (2008); Abdominal hysterectomy; Carpal tunnel release; Tumor excision (Left); Cardiac catheterization; Cardiac catheterization (N/A, 08/17/2016); CARDIOVERSION (N/A, 08/15/2018); CARDIOVERSION (N/A, 11/14/2018); Cardioversion (N/A, 06/05/2019); Cardioversion (N/A, 08/14/2019); Cardioversion (N/A, 11/09/2019); and Cardioversion (N/A, 01/17/2020). Family: family history includes Stroke in her father and paternal grandmother.  Laboratory Chemistry Profile   Renal Lab Results  Component Value Date   BUN 14 03/06/2023   CREATININE 0.72 03/06/2023   BCR 21 03/20/2022   GFRAA >60 05/14/2020   GFRNONAA >60 03/06/2023    Hepatic Lab Results  Component Value Date   AST 20 02/01/2023   ALT 17 02/01/2023   ALBUMIN 3.7 02/01/2023   ALKPHOS 80 02/01/2023   LIPASE 30 07/10/2022    Electrolytes Lab Results  Component Value Date   NA 137 03/06/2023   K 3.7 03/06/2023   CL 101 03/06/2023   CALCIUM 9.1 03/06/2023   MG 2.4 02/03/2023   PHOS 3.5 01/30/2021    Bone Lab Results  Component Value Date   VD25OH 25.5 (L) 11/25/2020   25OHVITD1 31 04/30/2021   25OHVITD2 <1.0 04/30/2021   25OHVITD3 31 04/30/2021    Inflammation (CRP: Acute Phase) (ESR: Chronic Phase) Lab Results  Component Value Date   CRP 6 04/30/2021   ESRSEDRATE 40 04/30/2021   LATICACIDVEN 1.5 01/25/2022         Note: Above Lab results reviewed.  Recent Imaging Review  CT Chest High Resolution CLINICAL DATA:  77 year old female with history of chronic cough for the past several months. History of right upper lobe lung cancer. * Tracking Code: BO *  EXAM: CT CHEST WITHOUT CONTRAST  TECHNIQUE: Multidetector CT imaging of the chest was performed following the standard protocol without intravenous contrast. High resolution imaging of the lungs, as well as inspiratory and expiratory imaging, was performed.  RADIATION DOSE REDUCTION: This exam was performed according to the departmental  dose-optimization program which includes automated exposure control, adjustment of the mA and/or kV according to patient size and/or use of iterative reconstruction technique.  COMPARISON:  PET-CT 06/29/2022. High-resolution chest CT 06/02/2022.  FINDINGS: Cardiovascular: Heart size is normal. There is no significant pericardial  fluid, thickening or pericardial calcification. There is aortic atherosclerosis, as well as atherosclerosis of the great vessels of the mediastinum and the coronary arteries, including calcified atherosclerotic plaque in the left anterior descending and left circumflex coronary arteries. Status post TAVR. Left-sided pacemaker device in place with lead tips terminating in the right atrium and right ventricular apex.  Mediastinum/Nodes: No pathologically enlarged mediastinal or hilar lymph nodes. Please note that accurate exclusion of hilar adenopathy is limited on noncontrast CT scans. Esophagus is unremarkable in appearance. No axillary lymphadenopathy.  Lungs/Pleura: Previously noted right upper lobe pulmonary nodule appears enlarged when compared to the prior study (axial image 25 of series 8 and coronal image 72 of series 4) estimated to measure approximately 1.1 x 1.2 x 1.6 cm on today's examination, with irregular macrolobulated and spiculated margins, including spiculation which extends to the pleura superiorly where the pleura appears mildly thickened. Persistent linear architectural distortion and volume loss in the posteromedial aspect of the right lower lobe, similar to the prior study, favored to reflect chronic post infectious or inflammatory scarring. Some nonspecific ground-glass attenuation is noted in the posterolateral aspect of the left upper lobe, also stable compared to the prior examinations. No generalized areas of septal thickening, subpleural reticulation, traction bronchiectasis or honeycombing are noted to suggest interstitial lung  disease. Inspiratory and expiratory imaging demonstrates some mild air trapping indicative of small airways disease. There is also diffuse bronchial wall thickening with moderate centrilobular and paraseptal emphysema.  Upper Abdomen: Atherosclerosis in the abdominal aorta. Numerous calcified gallstones lying dependently in the visualized gallbladder.  Musculoskeletal: Median sternotomy wires. There are no aggressive appearing lytic or blastic lesions noted in the visualized portions of the skeleton.  IMPRESSION: 1. No findings to suggest interstitial lung disease. 2. Interval enlargement of previously noted right upper lobe pulmonary nodule which has a very aggressive appearance, highly concerning for primary bronchogenic neoplasm. This currently measures 1.1 x 1.2 x 1.6 cm and has spiculations extending to the overlying pleura superiorly which appears mildly thickened. 3. Diffuse bronchial wall thickening with moderate centrilobular and paraseptal emphysema; imaging findings suggestive of underlying COPD. 4. Mild air trapping indicative of small airways disease. 5. Aortic atherosclerosis, in addition to 2 vessel coronary artery disease. Assessment for potential risk factor modification, dietary therapy or pharmacologic therapy may be warranted, if clinically indicated. 6. Cholelithiasis. 7. Additional incidental findings, similar to prior studies, as above.  Aortic Atherosclerosis (ICD10-I70.0) and Emphysema (ICD10-J43.9).  Electronically Signed   By: Trudie Reed M.D.   On: 03/18/2023 05:22 Note: Reviewed        Physical Exam  General appearance: Well nourished, well developed, and well hydrated. In no apparent acute distress Mental status: Alert, oriented x 3 (person, place, & time)       Respiratory: No evidence of acute respiratory distress Eyes: PERLA Vitals: BP 120/64   Pulse 73   Temp (!) 97 F (36.1 C)   Resp 15   Ht 4\' 10"  (1.473 m)   Wt 182 lb (82.6  kg)   SpO2 95%   BMI 38.04 kg/m  BMI: Estimated body mass index is 38.04 kg/m as calculated from the following:   Height as of this encounter: 4\' 10"  (1.473 m).   Weight as of this encounter: 182 lb (82.6 kg). Ideal: Patient must be at least 60 in tall to calculate ideal body weight  Assessment   Diagnosis Status  1. Trigger point with neck pain   2. Trigger point of shoulder region, unspecified  laterality   3. Chronic myofascial pain   4. Chronic neck pain (3ry area of Pain) (Posterior) (Bilateral) (L>R)   5. COPD with hypoxia ([name])   6. Oxygen dependent   7. Postop check    Controlled Controlled Controlled   Updated Problems: No problems updated.  Plan of Care  Problem-specific:  No problem-specific Assessment & Plan notes found for this encounter.  Ms. LANDREE FERNHOLZ has a current medication list which includes the following long-term medication(s): atorvastatin, bumetanide, calcium carbonate-vitamin d, carvedilol, eliquis, ferrous sulfate, fluticasone, ipratropium-albuterol, levothyroxine, lisinopril, pantoprazole, sertraline, trazodone, and ventolin hfa.  Pharmacotherapy (Medications Ordered): No orders of the defined types were placed in this encounter.  Orders:  Orders Placed This Encounter  Procedures   Nursing Instructions:    Please complete this patient's postprocedure evaluation.    Scheduling Instructions:     Please complete this patient's postprocedure evaluation.   Follow-up plan:   Return if symptoms worsen or fail to improve.      Interventional Therapies  Risk  Complexity Considerations:   Hard of hearing (HOH)  COPD  O2 dependent  positional orthopnea  Eliquis Anticoagulation: (Stop: 3 days  Restart: 6 hours)  A-fib  prosthetic AoV (Prophylactic ampicillin 2g IV)  Cardiac pacemaker     Planned  Pending:      Under consideration:   Palliative bilateral trapezius + supraspinatus m. MNB/TPI #4    Completed:   Diagnostic bilateral  suprascapular NB x2 (09/25/2021) (100/100/100/100 x 2.5 months)  Therapeutic bilateral Trapezius and supraspinatus MNB x3 (03/16/2023) (100/100/100 x 27 days/80-90)    Therapeutic  Palliative (PRN) options:   Therapeutic upper back and shoulder (trapezius & supraspinatus) MNB/TPI    Pharmacotherapy  Poor candidate for any medication that may induce respiratory depression.       Recent Visits Date Type Provider Dept  03/16/23 Procedure visit Delano Metz, MD [facility]  03/08/23 Office Visit Delano Metz, MD [facility]  Showing recent visits within past 90 days and meeting all other requirements Today's Visits Date Type Provider Dept  04/05/23 Office Visit Delano Metz, MD [facility]  Showing today's visits and meeting all other requirements Future Appointments No visits were found meeting these conditions. Showing future appointments within next 90 days and meeting all other requirements  I discussed the assessment and treatment plan with the patient. The patient was provided an opportunity to ask questions and all were answered. The patient agreed with the plan and demonstrated an understanding of the instructions.  Patient advised to call back or seek an in-person evaluation if the symptoms or condition worsens.  Duration of encounter: 20 minutes.  Total time on encounter, as per AMA guidelines included both the face-to-face and non-face-to-face time personally spent by the physician and/or other qualified health care professional(s) on the day of the encounter (includes time in activities that require the physician or other qualified health care professional and does not include time in activities normally performed by clinical staff). Physician's time may include the following activities when performed: Preparing to see the patient (e.g., pre-charting review of records, searching for previously ordered imaging, lab work, and nerve  conduction tests) Review of prior analgesic pharmacotherapies. Reviewing PMP Interpreting ordered tests (e.g., lab work, imaging, nerve conduction tests) Performing post-procedure evaluations, including interpretation of diagnostic procedures Obtaining and/or reviewing separately obtained history Performing a medically appropriate examination and/or evaluation Counseling and educating the patient/family/caregiver Ordering medications, tests, or procedures Referring and communicating with other health care  professionals (when not separately reported) Documenting clinical information in the electronic or other health record Independently interpreting results (not separately reported) and communicating results to the patient/ family/caregiver Care coordination (not separately reported)  Note by: Oswaldo Done, MD Date: 04/05/2023; Time: 2:19 PM

## 2023-04-02 ENCOUNTER — Inpatient Hospital Stay: Payer: Medicare Other

## 2023-04-02 ENCOUNTER — Inpatient Hospital Stay: Payer: Medicare Other | Attending: Internal Medicine | Admitting: Internal Medicine

## 2023-04-02 ENCOUNTER — Encounter: Payer: Self-pay | Admitting: Internal Medicine

## 2023-04-02 VITALS — BP 108/64 | HR 70 | Temp 97.3°F | Resp 20 | Ht <= 58 in | Wt 183.0 lb

## 2023-04-02 DIAGNOSIS — J9610 Chronic respiratory failure, unspecified whether with hypoxia or hypercapnia: Secondary | ICD-10-CM | POA: Diagnosis not present

## 2023-04-02 DIAGNOSIS — Z79899 Other long term (current) drug therapy: Secondary | ICD-10-CM | POA: Insufficient documentation

## 2023-04-02 DIAGNOSIS — I509 Heart failure, unspecified: Secondary | ICD-10-CM | POA: Diagnosis not present

## 2023-04-02 DIAGNOSIS — Z791 Long term (current) use of non-steroidal anti-inflammatories (NSAID): Secondary | ICD-10-CM | POA: Insufficient documentation

## 2023-04-02 DIAGNOSIS — G4733 Obstructive sleep apnea (adult) (pediatric): Secondary | ICD-10-CM | POA: Diagnosis not present

## 2023-04-02 DIAGNOSIS — I110 Hypertensive heart disease with heart failure: Secondary | ICD-10-CM | POA: Diagnosis not present

## 2023-04-02 DIAGNOSIS — R911 Solitary pulmonary nodule: Secondary | ICD-10-CM | POA: Diagnosis not present

## 2023-04-02 DIAGNOSIS — J449 Chronic obstructive pulmonary disease, unspecified: Secondary | ICD-10-CM | POA: Diagnosis not present

## 2023-04-02 DIAGNOSIS — Z7901 Long term (current) use of anticoagulants: Secondary | ICD-10-CM | POA: Insufficient documentation

## 2023-04-02 DIAGNOSIS — J432 Centrilobular emphysema: Secondary | ICD-10-CM | POA: Diagnosis not present

## 2023-04-02 DIAGNOSIS — D649 Anemia, unspecified: Secondary | ICD-10-CM | POA: Insufficient documentation

## 2023-04-02 DIAGNOSIS — Z87891 Personal history of nicotine dependence: Secondary | ICD-10-CM | POA: Insufficient documentation

## 2023-04-02 DIAGNOSIS — I350 Nonrheumatic aortic (valve) stenosis: Secondary | ICD-10-CM | POA: Insufficient documentation

## 2023-04-02 NOTE — Progress Notes (Signed)
Patient is here today for Malignant neoplasm of right upper lobe of lung. Patient has a lot of questions about what is going on. Does not understand why she has an appointment with Dr. Aggie Cosier in August.

## 2023-04-02 NOTE — Progress Notes (Signed)
[facility] CONSULT NOTE  Patient Care Team: Lyndon Code, MD as PCP - General (Internal Medicine) Iran Ouch, MD as PCP - Cardiology (Cardiology) Monika Salk, [facility] (Inactive) as Pharmacist (Pharmacist)  CHIEF COMPLAINTS/PURPOSE OF CONSULTATION: Anemia  #Chronic intermittent anemia since 2017-mild; since 2022-hemoglobin 8-9; MCV 100s; July 2022-SPEP normal; folic acid B12/DAT/haptoglobin/LDH-within normal limits; iron saturation 32% ferritin 42.  TSH normal;  elevated reticulocyte count/elevated erythropoietin level.  Pathologist review of smear-no schistocytes or spherocytes/hemolysis  # COPD [Dr.Saadat Khan/ CHF- Dr.Parashoes]; 2008 [bioprosthetic valve aortic] Oncology History   No history exists.     HISTORY OF PRESENTING ILLNESS: Alone. Ambulating independently. -nasal cannula oxygen 3.5 L. [name] 77 y.o.  female patient with multiple medical problems including aortic stenosis bioprosthetic valve; A. fib on  eliquis; chronic respiratory failure [CHF/COPD]-on 3.5 L of oxygen; with morbid obesity is here for follow-up.  In the interim patient was noted to have a right upper lobe lung nodule.  Patient felt poor candidate for biopsy given her respiratory issues.  Patient underwent radiation-SBRT finished July 5.  Patient had a CT scan-that showed states increasing size of the lung nodule referred to [place] for further evaluation recommendations.   Chronic mild fatigue.  Denies any chest pain.  Complains of shortness of breath on exertion.  No blood in stools or black-colored stools.  I patient awaiting left shoulder steroidal injection.   Review of Systems  Constitutional:  Positive for malaise/fatigue. Negative for chills, diaphoresis, fever and weight loss.  HENT:  Negative for nosebleeds and sore throat.   Eyes:  Negative for double vision.  Respiratory:  Positive for cough and shortness of breath. Negative for hemoptysis, sputum production and  wheezing.   Cardiovascular:  Negative for chest pain, palpitations, orthopnea and leg swelling.  Gastrointestinal:  Negative for abdominal pain, blood in stool, constipation, diarrhea, heartburn, melena, nausea and vomiting.  Genitourinary:  Negative for dysuria, frequency and urgency.  Musculoskeletal:  Positive for joint pain. Negative for back pain.  Skin: Negative.  Negative for itching and rash.  Neurological:  Negative for dizziness, tingling, focal weakness, weakness and headaches.  Endo/Heme/Allergies:  Does not bruise/bleed easily.  Psychiatric/Behavioral:  Negative for depression. The patient is not nervous/anxious and does not have insomnia.      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 s/p bioprosthetic valve replacement 2008 at [facility];  b. 01/2015 Echo: EF 60-65%, no rwma, Gr1 DD, mildly dil LA, nl RV fxn.   Aspiration pneumonia ([name])    Atrial fibrillation with RVR ([name]) 01/11/2015   Atrial flutter ([name])    a. 08/2016 s/p DCCV.  Remains on flecainide 50 mg bid.   Basal skull fracture ([name]) 20 yrs ago   CHF (congestive heart failure) ([name])    Chronic respiratory failure ([name])    COPD (chronic obstructive pulmonary disease) ([name])    a. 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 2008 prior to Aortic aneurysm repair-->nl cors.   History of stress test    a. 10/2015 MV: no ischemia/infarct.   HLD (hyperlipidemia)    HTN (hypertension)    Hypothyroidism    Obesity    PAF (paroxysmal atrial fibrillation) ([name])    a. on Eliquis; b.  CHADS2VASc = 3 (HTN, age x 1, female).   Paroxysmal atrial fibrillation ([name]) 01/24/2015   Right upper quadrant abdominal tenderness without rebound tenderness 03/02/2018   S/P ascending aortic aneurysm repair 2008    SURGICAL HISTORY: Past Surgical History:   Procedure Laterality Date   ABDOMINAL AORTIC ANEURYSM REPAIR  2008   ABDOMINAL HYSTERECTOMY     AORTIC VALVE REPLACEMENT  2008   CARDIAC CATHETERIZATION     [facility]   CARDIOVERSION N/A 08/15/2018   Procedure: CARDIOVERSION;  Surgeon: Iran Ouch, MD;  Location: [facility] ORS;  Service: Cardiovascular;  Laterality: N/A;   CARDIOVERSION N/A 11/14/2018   Procedure: CARDIOVERSION (CATH LAB);  Surgeon: Iran Ouch, MD;  Location: [facility] ORS;  Service: Cardiovascular;  Laterality: N/A;   CARDIOVERSION N/A 06/05/2019   Procedure: CARDIOVERSION;  Surgeon: Iran Ouch, MD;  Location: [facility] ORS;  Service: Cardiovascular;  Laterality: N/A;   CARDIOVERSION N/A 08/14/2019   Procedure: CARDIOVERSION;  Surgeon: Iran Ouch, MD;  Location: [facility] ORS;  Service: Cardiovascular;  Laterality: N/A;   CARDIOVERSION N/A 11/09/2019   Procedure: CARDIOVERSION;  Surgeon: Marcina Millard, MD;  Location: [facility] ORS;  Service: Cardiovascular;  Laterality: N/A;   CARDIOVERSION N/A 01/17/2020   Procedure: CARDIOVERSION;  Surgeon: Marcina Millard, MD;  Location: [facility] ORS;  Service: Cardiovascular;  Laterality: N/A;   CARPAL TUNNEL RELEASE     ELECTROPHYSIOLOGIC STUDY N/A 08/17/2016   Procedure: Cardioversion;  Surgeon: Iran Ouch, MD;  Location: [facility] ORS;  Service: Cardiovascular;  Laterality: N/A;   TUMOR EXCISION Left    x3 (arm)    SOCIAL HISTORY: Social History   Socioeconomic History   Marital status: Divorced    Spouse name: Not on file   Number of children: Not on file   Years of education: Not on file   Highest education level: Not on file  Occupational History   Not on file  Tobacco Use   Smoking status: Former    Types: Cigarettes   Smokeless tobacco: Never  Vaping Use   Vaping status: Never Used  Substance and Sexual Activity   Alcohol use: No   Drug use: No   Sexual activity: Never    Birth control/protection: Surgical  Other Topics Concern   Not on file  Social  History Narrative   Not on file   Social Determinants of Health   Financial Resource Strain: Low Risk  (11/03/2022)   [facility]   Overall Financial Resource Strain (CARDIA)    Difficulty of Paying Living Expenses: Not hard at all  Recent Concern: Financial Resource Strain - Medium Risk (11/02/2022)   Received from [facility]   Overall Financial Resource Strain (CARDIA)    Difficulty of Paying Living Expenses: Somewhat hard  Food Insecurity: Food Insecurity Present (04/02/2023)   Hunger Vital Sign    Worried About Running Out of Food in the Last Year: Sometimes true    Ran Out of Food in the Last Year: Sometimes true  Transportation Needs: No Transportation Needs (02/02/2023)   PRAPARE - Administrator, Civil Service (Medical): No    Lack of Transportation (Non-Medical): No  Physical Activity: Not on file  Stress: Not on file  Social Connections: Not on file  Intimate Partner Violence: Not At Risk (04/02/2023)   Humiliation, Afraid, Rape, and Kick questionnaire    Fear of Current or Ex-Partner: No    Emotionally Abused: No    Physically Abused:  No    Sexually Abused: No    FAMILY HISTORY: Family History  Problem Relation Age of Onset   Stroke Father    Stroke Paternal Grandmother     ALLERGIES:  is allergic to benadryl [diphenhydramine hcl (sleep)], cetirizine, lasix [furosemide], levaquin [levofloxacin in d5w], meloxicam, and sulfa antibiotics.  MEDICATIONS:  Current Outpatient Medications  Medication Sig Dispense Refill   ALPRAZolam (XANAX) 0.25 MG tablet Take 1 tablet (0.25 mg total) by mouth 2 (two) times daily as needed for anxiety. 60 tablet 1   amLODipine (NORVASC) 5 MG tablet Take 5 mg by mouth in the morning and at bedtime.     atorvastatin (LIPITOR) 40 MG tablet TAKE 1 TABLET BY MOUTH EVERY NIGHT AT BEDTIME 90 tablet 1   BREZTRI AEROSPHERE 160-9-4.8 MCG/ACT AERO INHALE 2  PUFFS INTO THE LUNGS TWICE DAILY 10.7 g 11   bumetanide (BUMEX) 1 MG tablet Take 2 tablets (2 mg total) by mouth 2 (two) times daily. And additional 2 tablets PM PRN 360 tablet 3   Calcium Carbonate-Vitamin D (CALCIUM 600+D PO) Take 1 tablet by mouth daily.     carvedilol (COREG) 25 MG tablet Take 25 mg by mouth 2 (two) times daily with a meal.     Coenzyme Q10 (COQ10) 200 MG CAPS Take 200 mg by mouth daily.     diclofenac Sodium (VOLTAREN) 1 % GEL Apply 2 g topically 4 (four) times daily.     ELIQUIS 5 MG TABS tablet TAKE 1 TABLET BY MOUTH TWICE DAILY 180 tablet 2   ferrous sulfate 325 (65 FE) MG tablet Take 1 tablet (325 mg total) by mouth daily with breakfast.  3   fluticasone (FLONASE) 50 MCG/ACT nasal spray Place 2 sprays into both nostrils daily.  2   ipratropium-albuterol (DUONEB) 0.5-2.5 (3) MG/3ML SOLN Inhale 3 mLs into the lungs every 4 (four) hours as needed. Inhale 3 mls into the lungs every 4 to 6 hours and as needed 360 mL 3   JARDIANCE 10 MG TABS tablet TAKE 1 TABLET(10 MG) BY MOUTH DAILY BEFORE BREAKFAST (Patient taking differently: Take 10 mg by mouth daily.) 30 tablet 5   levothyroxine (SYNTHROID) 25 MCG tablet TAKE 1 TABLET BY MOUTH DAILY BEFORE BREAKFAST 90 tablet 1   lisinopril (ZESTRIL) 40 MG tablet TAKE 1 TABLET(40 MG) BY MOUTH DAILY (Patient taking differently: Take 40 mg by mouth daily.) 90 tablet 3   loratadine (CLARITIN) 10 MG tablet Take 10 mg by mouth daily.     oxyCODONE-acetaminophen (PERCOCET) 10-325 MG tablet Take 1 tablet by mouth 2 (two) times daily as needed for pain. 60 tablet 0   pantoprazole (PROTONIX) 40 MG tablet TAKE 1 TABLET BY MOUTH EVERY DAY 90 tablet 1   potassium chloride (KLOR-CON) 10 MEQ tablet Take 10 mEq by mouth 2 (two) times daily.     revefenacin (YUPELRI) 175 MCG/3ML nebulizer solution Take 3 mLs (175 mcg total) by nebulization daily. 90 mL 5   sertraline (ZOLOFT) 100 MG tablet TAKE 1 TABLET BY MOUTH ONCE A DAY FOR ANXIETY (Patient taking  differently: Take 100 mg by mouth daily. TAKE 1 TABLET BY MOUTH ONCE A DAY FOR ANXIETY) 90 tablet 2   sodium chloride (OCEAN) 0.65 % SOLN nasal spray Place 1 spray into both nostrils as needed for congestion.     traZODone (DESYREL) 50 MG tablet TAKE 1 TABLET BY MOUTH EVERY DAY AT BEDTIME 90 tablet 1   VENTOLIN HFA 108 (90 Base) MCG/ACT inhaler INHALE 2 PUFFS  INTO THE LUNGS EVERY 6 HOURS AS NEEDED FOR WHEEZING OR SHORTNESS OF BREATH 18 g 3   chlorpheniramine-HYDROcodone (TUSSIONEX) 10-8 MG/5ML Take 5 mLs by mouth every 12 (twelve) hours as needed. (Patient not taking: Reported on 04/02/2023) 140 mL 0   No current facility-administered medications for this visit.      [name]  PHYSICAL EXAMINATION: ECOG PERFORMANCE STATUS: 1 - Symptomatic but completely ambulatory  Vitals:   04/02/23 1437  BP: 108/64  Pulse: 70  Resp: 20  Temp: (!) 97.3 F (36.3 C)  SpO2: 96%   Filed Weights   04/02/23 1437  Weight: 183 lb (83 kg)    Physical Exam Vitals and nursing note reviewed.  HENT:     Head: Normocephalic and atraumatic.     Mouth/Throat:     Pharynx: Oropharynx is clear.  Eyes:     Extraocular Movements: Extraocular movements intact.     Pupils: Pupils are equal, round, and reactive to light.  Cardiovascular:     Rate and Rhythm: Normal rate and regular rhythm.  Pulmonary:     Comments: Decreased breath sounds bilaterally.  Abdominal:     Palpations: Abdomen is soft.  Musculoskeletal:        General: Normal range of motion.     Cervical back: Normal range of motion.  Skin:    General: Skin is warm.  Neurological:     General: No focal deficit present.     Mental Status: She is alert and oriented to person, place, and time.  Psychiatric:        Behavior: Behavior normal.        Judgment: Judgment normal.     LABORATORY DATA:  I have reviewed the data as listed Lab Results  Component Value Date   WBC 5.4 02/01/2023   HGB 11.2 (L) 02/01/2023   HCT 36.3 02/01/2023   MCV  90.8 02/01/2023   PLT 235 02/01/2023   Recent Labs    07/10/22 1640 08/03/22 2327 08/05/22 0532 02/01/23 2240 02/02/23 1153 02/05/23 0404 02/06/23 0421 03/06/23 2032  NA 136 137   < >  --    < > 138 138 137  K 3.9 3.3*   < >  --    < > 4.3 4.1 3.7  CL 103 102   < >  --    < > 101 98 101  CO2 26 27   < >  --    < > 30 31 26   GLUCOSE 109* 118*   < >  --    < > 97 103* 119*  BUN 24* 16   < >  --    < > 18 17 14   CREATININE 0.97 0.84   < >  --    < > 0.72 0.68 0.72  CALCIUM 9.2 8.7*   < >  --    < > 8.5* 8.8* 9.1  GFRNONAA >60 >60   < >  --    < > >60 >60 >60  PROT 7.0 6.7  --  6.9  --   --   --   --   ALBUMIN 3.8 3.6  --  3.7  --   --   --   --   AST 19 18  --  20  --   --   --   --   ALT 15 15  --  17  --   --   --   --   ALKPHOS 78 64  --  80  --   --   --   --   BILITOT 0.6 0.7  --  0.6  --   --   --   --   BILIDIR  --   --   --  <0.1  --   --   --   --   IBILI  --   --   --  NOT CALCULATED  --   --   --   --    < > = values in this interval not displayed.    RADIOGRAPHIC STUDIES: I have personally reviewed the radiological images as listed and agreed with the findings in the report. CT Chest High Resolution  Result Date: 03/18/2023 CLINICAL DATA:  77 year old female with history of chronic cough for the past several months. History of right upper lobe lung cancer. * Tracking Code: BO * EXAM: CT CHEST WITHOUT CONTRAST TECHNIQUE: Multidetector CT imaging of the chest was performed following the standard protocol without intravenous contrast. High resolution imaging of the lungs, as well as inspiratory and expiratory imaging, was performed. RADIATION DOSE REDUCTION: This exam was performed according to the departmental dose-optimization program which includes automated exposure control, adjustment of the mA and/or kV according to patient size and/or use of iterative reconstruction technique. COMPARISON:  PET-CT 06/29/2022. High-resolution chest CT 06/02/2022. FINDINGS:  Cardiovascular: Heart size is normal. There is no significant pericardial fluid, thickening or pericardial calcification. There is aortic atherosclerosis, as well as atherosclerosis of the great vessels of the mediastinum and the coronary arteries, including calcified atherosclerotic plaque in the left anterior descending and left circumflex coronary arteries. Status post TAVR. Left-sided pacemaker device in place with lead tips terminating in the right atrium and right ventricular apex. Mediastinum/Nodes: No pathologically enlarged mediastinal or hilar lymph nodes. Please note that accurate exclusion of hilar adenopathy is limited on noncontrast CT scans. Esophagus is unremarkable in appearance. No axillary lymphadenopathy. Lungs/Pleura: Previously noted right upper lobe pulmonary nodule appears enlarged when compared to the prior study (axial image 25 of series 8 and coronal image 72 of series 4) estimated to measure approximately 1.1 x 1.2 x 1.6 cm on today's examination, with irregular macrolobulated and spiculated margins, including spiculation which extends to the pleura superiorly where the pleura appears mildly thickened. Persistent linear architectural distortion and volume loss in the posteromedial aspect of the right lower lobe, similar to the prior study, favored to reflect chronic post infectious or inflammatory scarring. Some nonspecific ground-glass attenuation is noted in the posterolateral aspect of the left upper lobe, also stable compared to the prior examinations. No generalized areas of septal thickening, subpleural reticulation, traction bronchiectasis or honeycombing are noted to suggest interstitial lung disease. Inspiratory and expiratory imaging demonstrates some mild air trapping indicative of small airways disease. There is also diffuse bronchial wall thickening with moderate centrilobular and paraseptal emphysema. Upper Abdomen: Atherosclerosis in the abdominal aorta. Numerous calcified  gallstones lying dependently in the visualized gallbladder. Musculoskeletal: Median sternotomy wires. There are no aggressive appearing lytic or blastic lesions noted in the visualized portions of the skeleton. IMPRESSION: 1. No findings to suggest interstitial lung disease. 2. Interval enlargement of previously noted right upper lobe pulmonary nodule which has a very aggressive appearance, highly concerning for primary bronchogenic neoplasm. This currently measures 1.1 x 1.2 x 1.6 cm and has spiculations extending to the overlying pleura superiorly which appears mildly thickened. 3. Diffuse bronchial wall thickening with moderate centrilobular and paraseptal emphysema; imaging findings suggestive of underlying COPD. 4. Mild  air trapping indicative of small airways disease. 5. Aortic atherosclerosis, in addition to 2 vessel coronary artery disease. Assessment for potential risk factor modification, dietary therapy or pharmacologic therapy may be warranted, if clinically indicated. 6. Cholelithiasis. 7. Additional incidental findings, similar to prior studies, as above. Aortic Atherosclerosis (ICD10-I70.0) and Emphysema (ICD10-J43.9). Electronically Signed   By: Trudie Reed M.D.   On: 03/18/2023 05:22   DG Chest 2 View  Result Date: 03/06/2023 CLINICAL DATA:  Chest pain EXAM: CHEST - 2 VIEW COMPARISON:  02/22/2023 FINDINGS: Post sternotomy changes and valve prosthesis. Left-sided pacing device. Cardiomegaly with vascular congestion and mild diffuse interstitial opacity likely low-grade edema. Probable trace pleural effusions. Underlying chronic interstitial disease at the bases IMPRESSION: Cardiomegaly with vascular congestion and mild interstitial edema superimposed on underlying chronic interstitial changes at the bases. Electronically Signed   By: Jasmine Pang M.D.   On: 03/06/2023 21:00    ASSESSMENT & PLAN:   Nodule of upper lobe of right lung # RUL lung nodule presumed malignancy [no biopsy] s/p  SBRT  [finished March 12, 2023]. However, JULY 2024- Interval enlargement of previously noted right upper lobe pulmonary nodule which has a very aggressive appearance, highly concerning for primary bronchogenic neoplasm. This currently measures 1.1 x 1.2 x 1.6 cm and has spiculations extending to the overlying pleura superiorly which appears mildly thickened.  # Discussed with patient that the findings noted on the CT scan in July suggestive of radiation changes; clinically not suggestive of any progression of disease.  Recommend further evaluation with imaging in about 3 to 4 months/refer to Dr. Aggie Cosier for further follow-up.  Patient does not need any systemic therapy at this time.discussed with Dr.Chrystal.    # Chronic mild anemia stable.  # COPD/CHF- s/p Valve- bioprosthetoc [2008- on eliquis] / OSA- on CPAP- 3.5 lits   Thank you Dr.Khan for allowing me to participate in the care of your pleasant patient. Please do not hesitate to contact me with questions or concerns in the interim.  # DISPOSITION: # NO labs today # Follow up as needed-Dr.B  # I reviewed the blood work- with the patient in detail; also reviewed the imaging independently [as summarized above]; and with the patient in detail.     All questions were answered. The patient knows to call the clinic with any problems, questions or concerns.     Earna Coder, MD 04/02/2023 3:32 PM

## 2023-04-02 NOTE — Assessment & Plan Note (Addendum)
#   RUL lung nodule presumed malignancy [no biopsy] s/p SBRT  [finished March 12, 2023]. However, JULY 2024- Interval enlargement of previously noted right upper lobe pulmonary nodule which has a very aggressive appearance, highly concerning for primary bronchogenic neoplasm. This currently measures 1.1 x 1.2 x 1.6 cm and has spiculations extending to the overlying pleura superiorly which appears mildly thickened.  # Discussed with patient that the findings noted on the CT scan in July suggestive of radiation changes; clinically not suggestive of any progression of disease.  Recommend further evaluation with imaging in about 3 to 4 months/refer to Dr. Aggie Cosier for further follow-up.  Patient does not need any systemic therapy at this time.discussed with Dr.Chrystal.    # Chronic mild anemia stable.  # COPD/CHF- s/p Valve- bioprosthetoc [2008- on eliquis] / OSA- on CPAP- 3.5 lits   Thank you Dr.Khan for allowing me to participate in the care of your pleasant patient. Please do not hesitate to contact me with questions or concerns in the interim.  # DISPOSITION: # NO labs today # Follow up as needed-Dr.B  # I reviewed the blood work- with the patient in detail; also reviewed the imaging independently [as summarized above]; and with the patient in detail.

## 2023-04-05 ENCOUNTER — Other Ambulatory Visit: Payer: Self-pay

## 2023-04-05 ENCOUNTER — Encounter: Payer: Self-pay | Admitting: Pain Medicine

## 2023-04-05 ENCOUNTER — Ambulatory Visit: Payer: Medicare Other | Attending: Pain Medicine | Admitting: Pain Medicine

## 2023-04-05 ENCOUNTER — Telehealth: Payer: Self-pay

## 2023-04-05 VITALS — BP 120/64 | HR 73 | Temp 97.0°F | Resp 15 | Ht <= 58 in | Wt 182.0 lb

## 2023-04-05 DIAGNOSIS — G8929 Other chronic pain: Secondary | ICD-10-CM

## 2023-04-05 DIAGNOSIS — J449 Chronic obstructive pulmonary disease, unspecified: Secondary | ICD-10-CM

## 2023-04-05 DIAGNOSIS — Z9981 Dependence on supplemental oxygen: Secondary | ICD-10-CM

## 2023-04-05 DIAGNOSIS — M7918 Myalgia, other site: Secondary | ICD-10-CM | POA: Diagnosis not present

## 2023-04-05 DIAGNOSIS — Z09 Encounter for follow-up examination after completed treatment for conditions other than malignant neoplasm: Secondary | ICD-10-CM

## 2023-04-05 DIAGNOSIS — M542 Cervicalgia: Secondary | ICD-10-CM | POA: Diagnosis not present

## 2023-04-05 DIAGNOSIS — M25519 Pain in unspecified shoulder: Secondary | ICD-10-CM | POA: Diagnosis not present

## 2023-04-05 NOTE — Telephone Encounter (Signed)
CSW attempted to contact patient per the request of medical provider.  Left vm.

## 2023-04-05 NOTE — Progress Notes (Signed)
Safety precautions to be maintained throughout the outpatient stay will include: orient to surroundings, keep bed in low position, maintain call bell within reach at all times, provide assistance with transfer out of bed and ambulation.  

## 2023-04-06 ENCOUNTER — Inpatient Hospital Stay: Payer: Medicare Other | Admitting: Licensed Clinical Social Worker

## 2023-04-06 MED ORDER — OXYCODONE-ACETAMINOPHEN 10-325 MG PO TABS
1.0000 | ORAL_TABLET | Freq: Two times a day (BID) | ORAL | 0 refills | Status: DC | PRN
Start: 2023-04-06 — End: 2023-05-06

## 2023-04-06 NOTE — Progress Notes (Signed)
[facility] Clinical Social Work  Initial Assessment   [name] is a 77 y.o. year old female contacted by phone. Clinical Social Work was referred by medical provider for assessment of psychosocial needs.   SDOH (Social Determinants of Health) assessments performed: Yes SDOH Interventions    Flowsheet Row Office Visit from 04/05/2023 in [facility] Visit from 04/02/2023 in [facility] Visit from 05/05/2022 in [facility]  SDOH Interventions     Food Insecurity Interventions -- Intervention Not Indicated --  Housing Interventions -- Intervention Not Indicated --  Utilities Interventions -- Intervention Not Indicated --  Depression Interventions/Treatment  Currently on Treatment -- Currently on Treatment       SDOH Screenings   Food Insecurity: Food Insecurity Present (04/02/2023)  Housing: Low Risk  (04/02/2023)  Transportation Needs: No Transportation Needs (02/02/2023)  Utilities: At Risk (04/02/2023)  Alcohol Screen: Low Risk  (03/17/2022)  Depression (PHQ2-9): Medium Risk (04/05/2023)  Financial Resource Strain: Low Risk  (11/03/2022)   Received from [facility], [facility]  Recent Concern: Financial Resource Strain - Medium Risk (11/02/2022)   Received from [facility]  Tobacco Use: Medium Risk (04/05/2023)     Distress Screen completed: No     No data to display            Family/Social Information:  Housing Arrangement: patient lives with her adult granddaughter. Family members/support persons in your life? Family Transportation concerns: no  Employment: Retired  Income source: Actor concerns: No Type of concern: None Food access concerns: no Religious or spiritual practice: Yes-Patient identifies at [employer]. Services Currently in  place:  Medicare  Coping/ Adjustment to diagnosis: Patient understands treatment plan and what happens next? Patient stated she has a follow up visit with Dr. Aggie Cosier this week. Concerns about diagnosis and/or treatment: Afraid of cancer Patient reported stressors: Depression Hopes and/or priorities: To be pain free. Patient enjoys time with family/ friends Current coping skills/ strengths: Manufacturing systems engineer , Radio producer fund of knowledge , Motivation for treatment/growth , and Supportive family/friends     SUMMARY: Current SDOH Barriers:  Mental Health Concerns   Clinical Social Work Clinical Goal(s):  Explore community resource options for unmet needs related to:  Depression    Interventions: Discussed common feeling and emotions when being diagnosed with cancer, and the importance of support during treatment Informed patient of the support team roles and support services at [facility] Provided CSW contact information and encouraged patient to call with any questions or concerns Provided patient with information about counseling services that are available.  Although SDOH Screening indicates food insecurity, patient denied this.  She said she did not have any financial concerns either.  She stated that her chronic pain causes depression.  She was able to receive her pain medication today and wished to end the call because she recently took some of the medicine.   Follow Up Plan: Patient will contact CSW with any support or resource needs Patient verbalizes understanding of plan: Yes    Dorothey Baseman, LCSW Clinical Social Worker [facility]

## 2023-04-08 ENCOUNTER — Encounter: Payer: Self-pay | Admitting: Radiation Oncology

## 2023-04-08 ENCOUNTER — Other Ambulatory Visit: Payer: Self-pay | Admitting: *Deleted

## 2023-04-08 ENCOUNTER — Ambulatory Visit
Admission: RE | Admit: 2023-04-08 | Discharge: 2023-04-08 | Disposition: A | Discharge status: Home / Self Care | Payer: Medicare Other | Source: Ambulatory Visit | Attending: Radiation Oncology | Admitting: Radiation Oncology

## 2023-04-08 VITALS — Temp 98.0°F | Resp 20 | Wt 183.0 lb

## 2023-04-08 DIAGNOSIS — R918 Other nonspecific abnormal finding of lung field: Secondary | ICD-10-CM | POA: Insufficient documentation

## 2023-04-08 NOTE — Progress Notes (Signed)
Radiation Oncology Follow up Note  Name: [name]   Date:   04/08/2023 MRN:  [id number] DOB: 1945/12/06    This 77 y.o. 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  Seen today in routine follow-up she is stable.  She specifically denies any worsening of her overall pulmonary status cough hemoptysis or chest tightness..  She had a CT scan in early July showing interval enlargement although this was far too early for assessment of treatment effect.  COMPLICATIONS OF TREATMENT: none  FOLLOW UP COMPLIANCE: keeps appointments   PHYSICAL EXAM:  Temp 98 F (36.7 C) (Tympanic)   Resp 20   Wt 183 lb (83 kg)   BMI 38.25 kg/m  [place] elderly female wheelchair-bound on nasal oxygen.  Well-developed well-nourished patient in NAD. HEENT reveals PERLA, EOMI, discs not visualized.  Oral cavity is clear. No oral mucosal lesions are identified. Neck is clear without evidence of cervical or supraclavicular adenopathy. Lungs are clear to A&P. Cardiac examination is essentially unremarkable with regular rate and rhythm without murmur rub or thrill. Abdomen is benign with no organomegaly or masses noted. Motor sensory and DTR levels are equal and symmetric in the upper and lower extremities. Cranial nerves II through XII are grossly intact. Proprioception is intact. No peripheral adenopathy or edema is identified. No motor or sensory levels are noted. Crude visual fields are within normal range.  RADIOLOGY RESULTS: CT scan reviewed  PLAN: At this time patient has a low side effect profile from her SBRT.  I asked to see her back in 3 months with a repeat CT scan of her chest at that time.  She continues follow-up  care with medical oncology.  Patient is to call with any concerns.  I would like to take this opportunity to thank you for allowing me to participate in the care of your patient.[name], MD

## 2023-04-13 NOTE — Progress Notes (Deleted)
PCP: Beverely Risen, MD (last seen 06/24) Primary Cardiologist: Marcina Millard, MD (last seen 04/24; returns 09/24)  HPI:  [name] is a 77 y/o female with a history of hyperlipidemia, HTN, thyroid disease, lung cancer, severe COPD, left ear deafness, atrial fibrillation, aortic stenosis s/p aortic valve replacement with bioprosthetic valve in 2008, s/p TAVR on 11/03/2022 at [facility], ascending aortic aneurysm s/p repair, history of AV node ablation and dual-chamber pacemaker placement in June 2021, stroke, lung cancer, anxiety, chronic anemia, previous tobacco use and chronic heart failure.   Was in the ED 03/06/23 due to neck pain. Admitted 02/01/23 due to HF exacerbation. Given IV bumex. Admitted 11/02/22 due to TAVR. Admitted 08/04/22 due to worsening shortness of breath with exertion from her baseline associated with increased abdominal girth and orthopnea for about 4 days. Initially given IV lasix with transition to oral diuretics. Cardiology consult obtained. Discharged after 3 days.   Echo 02/07/20: EF of >55% along with severe LAE. Echo 05/16/21: EF of 45% along with mild LVH. Echo 11/11/21: EF of 60-65%. Echo 07/24/22: EF of >55% along with mild LVH Echo 11/04/22: EF >55%, moderate LVH      Cath 10/19/22: Reduced cardiac index at rest with PCWP 13 mmHg.  Moderate increase in PA pressure; PVR 3.9 Wu.  RA pressure 8 mm Hg. No obstructive CAD; left dominant system.   She presents today for a HF follow-up visit with a chief complaint of   ROS: All systems negative except as listed in HPI, PMH and Problem List.  SH:  Social History   Socioeconomic History   Marital status: Divorced    Spouse name: Not on file   Number of children: Not on file   Years of education: Not on file   Highest education level: Not on file  Occupational History   Not on file  Tobacco Use   Smoking status: Former    Types: Cigarettes   Smokeless tobacco: Never  Vaping Use   Vaping status: Never Used   Substance and Sexual Activity   Alcohol use: No   Drug use: No   Sexual activity: Never    Birth control/protection: Surgical  Other Topics Concern   Not on file  Social History Narrative   Not on file   Social Determinants of Health   Financial Resource Strain: Low Risk  (11/03/2022)   Received from [facility], [facility]   Overall Financial Resource Strain (CARDIA)    Difficulty of Paying Living Expenses: Not hard at all  Recent Concern: Financial Resource Strain - Medium Risk (11/02/2022)   Received from [facility]   Overall Financial Resource Strain (CARDIA)    Difficulty of Paying Living Expenses: Somewhat hard  Food Insecurity: Food Insecurity Present (04/02/2023)   Hunger Vital Sign    Worried About Running Out of Food in the Last Year: Sometimes true    Ran Out of Food in the Last Year: Sometimes true  Transportation Needs: No Transportation Needs (02/02/2023)   PRAPARE - Administrator, Civil Service (Medical): No    Lack of Transportation (Non-Medical): No  Physical Activity: Not on file  Stress: Not on file  Social Connections: Not on file  Intimate Partner Violence: Not At Risk (04/02/2023)   Humiliation, Afraid, Rape, and Kick questionnaire    Fear of Current or Ex-Partner: No    Emotionally Abused: No    Physically Abused: No    Sexually Abused: No  FH:  Family History  Problem Relation Age of Onset   Stroke Father    Stroke Paternal Grandmother     Past Medical History:  Diagnosis Date   Aortic 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 s/p bioprosthetic valve replacement 2008 at [facility];  b. 01/2015 Echo: EF 60-65%, no rwma, Gr1 DD, mildly dil LA, nl RV fxn.   Aspiration pneumonia ([name])    Atrial fibrillation with RVR ([name]) 01/11/2015   Atrial flutter ([name])    a. 08/2016 s/p DCCV.  Remains on flecainide 50 mg bid.   Basal skull fracture ([name]) 20 yrs ago   CHF (congestive heart  failure) ([name])    Chronic respiratory failure ([name])    COPD (chronic obstructive pulmonary disease) ([name])    a. 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 2008 prior to Aortic aneurysm repair-->nl cors.   History of stress test    a. 10/2015 MV: no ischemia/infarct.   HLD (hyperlipidemia)    HTN (hypertension)    Hypothyroidism    Obesity    PAF (paroxysmal atrial fibrillation) ([name])    a. on Eliquis; b. CHADS2VASc = 3 (HTN, age x 1, female).   Paroxysmal atrial fibrillation ([name]) 01/24/2015   Right upper quadrant abdominal tenderness without rebound tenderness 03/02/2018   S/P ascending aortic aneurysm repair 2008    Current Outpatient Medications  Medication Sig Dispense Refill   ALPRAZolam (XANAX) 0.25 MG tablet Take 1 tablet (0.25 mg total) by mouth 2 (two) times daily as needed for anxiety. 60 tablet 1   amLODipine (NORVASC) 5 MG tablet Take 5 mg by mouth in the morning and at bedtime.     atorvastatin (LIPITOR) 40 MG tablet TAKE 1 TABLET BY MOUTH EVERY NIGHT AT BEDTIME 90 tablet 1   BREZTRI AEROSPHERE 160-9-4.8 MCG/ACT AERO INHALE 2 PUFFS INTO THE LUNGS TWICE DAILY 10.7 g 11   bumetanide (BUMEX) 1 MG tablet Take 2 tablets (2 mg total) by mouth 2 (two) times daily. And additional 2 tablets PM PRN 360 tablet 3   Calcium Carbonate-Vitamin D (CALCIUM 600+D PO) Take 1 tablet by mouth daily.     carvedilol (COREG) 25 MG tablet Take 25 mg by mouth 2 (two) times daily with a meal.     Coenzyme Q10 (COQ10) 200 MG CAPS Take 200 mg by mouth daily.     diclofenac Sodium (VOLTAREN) 1 % GEL Apply 2 g topically 4 (four) times daily.     ELIQUIS 5 MG TABS tablet TAKE 1 TABLET BY MOUTH TWICE DAILY 180 tablet 2   ferrous sulfate 325 (65 FE) MG tablet Take 1 tablet (325 mg total) by mouth daily with breakfast.  3   fluticasone (FLONASE) 50 MCG/ACT nasal spray Place 2 sprays into both  nostrils daily.  2   ipratropium-albuterol (DUONEB) 0.5-2.5 (3) MG/3ML SOLN Inhale 3 mLs into the lungs every 4 (four) hours as needed. Inhale 3 mls into the lungs every 4 to 6 hours and as needed 360 mL 3   JARDIANCE 10 MG TABS tablet TAKE 1 TABLET(10 MG) BY MOUTH DAILY BEFORE BREAKFAST (Patient taking differently: Take 10 mg by mouth daily.) 30 tablet 5   levothyroxine (SYNTHROID) 25 MCG tablet TAKE 1 TABLET BY MOUTH DAILY BEFORE BREAKFAST 90 tablet 1   lisinopril (ZESTRIL) 40 MG  tablet TAKE 1 TABLET(40 MG) BY MOUTH DAILY (Patient taking differently: Take 40 mg by mouth daily.) 90 tablet 3   loratadine (CLARITIN) 10 MG tablet Take 10 mg by mouth daily.     oxyCODONE-acetaminophen (PERCOCET) 10-325 MG tablet Take 1 tablet by mouth 2 (two) times daily as needed for pain. 60 tablet 0   pantoprazole (PROTONIX) 40 MG tablet TAKE 1 TABLET BY MOUTH EVERY DAY 90 tablet 1   potassium chloride (KLOR-CON) 10 MEQ tablet Take 10 mEq by mouth 2 (two) times daily.     revefenacin (YUPELRI) 175 MCG/3ML nebulizer solution Take 3 mLs (175 mcg total) by nebulization daily. 90 mL 5   sertraline (ZOLOFT) 100 MG tablet TAKE 1 TABLET BY MOUTH ONCE A DAY FOR ANXIETY (Patient taking differently: Take 100 mg by mouth daily. TAKE 1 TABLET BY MOUTH ONCE A DAY FOR ANXIETY) 90 tablet 2   sodium chloride (OCEAN) 0.65 % SOLN nasal spray Place 1 spray into both nostrils as needed for congestion.     traZODone (DESYREL) 50 MG tablet TAKE 1 TABLET BY MOUTH EVERY DAY AT BEDTIME 90 tablet 1   VENTOLIN HFA 108 (90 Base) MCG/ACT inhaler INHALE 2 PUFFS INTO THE LUNGS EVERY 6 HOURS AS NEEDED FOR WHEEZING OR SHORTNESS OF BREATH 18 g 3   No current facility-administered medications for this visit.     PHYSICAL EXAM:  General:  Well appearing. No resp difficulty HEENT: normal Neck: supple. JVP flat. No lymphadenopathy or thryomegaly appreciated. Cor: PMI normal. Regular rate & rhythm. No rubs, gallops or murmurs. Lungs:  clear Abdomen: soft, nontender, nondistended. No hepatosplenomegaly. No bruits or masses.  Extremities: no cyanosis, clubbing, rash, trace pitting edema bilateral lower legs Neuro: alert & orientedx3, cranial nerves grossly intact. Moves all 4 extremities w/o difficulty. Affect pleasant.   ECG: not done     ASSESSMENT & PLAN:  1: Chronic heart failure with preserved ejection fraction with LVH- - suspect due to - NYHA class III - euvolemic today - weighing daily; reminded to call for an overnight weight gain of >2 pounds or a weekly weight gain of >5 pounds - she deferred getting weighed today; says that her home weight was 183 pounds - Echo 02/07/20: EF of >55% along with severe LAE. - Echo 05/16/21: EF of 45% along with mild LVH. - Echo 11/11/21: EF of 60-65%. - Echo 07/24/22: EF of >55% along with mild LVH - Echo 11/04/22: EF >55%, moderate LVH   - Cath 10/19/22: Reduced cardiac index at rest with PCWP 13 mmHg.  Moderate increase in PA pressure; PVR 3.9 Wu.  RA pressure 8 mm Hg. No obstructive CAD; left dominant system. - not adding salt and is using pepper for seasoning; reviewed the importance of closely following a low sodium diet  - doesn't cook so eating frozen/ easily prepared foods such as chicken nuggets, pizza etc; does look at food labels and emphasized that she keep her daily sodium to 2000mg  - she is unsure of fluid intake so advised to measure how many ounces her cup holds so that she can keep her daily fluid intake to ~ 60 ounces - continue carvedilol 25mg  BID - continue jardiance 10mg  daily - continue lisinopril 40mg  daily - continue bumex 2mg  BID/ potassium BID  - BNP 02/01/23 was 330.6  2: HTN- - BP  - had video visit with PCP (Abernathy) 06/24 - BMP 03/06/23 reviewed and showed sodium 137, potassium 3.7, creatinine 0.72 and GFR >60  3: severe  COPD- - wearing oxygen at 5L around the clock at home; when she comes to appointments, she has to put her portable  tank on 3L/ this tank is pulse and she doesn't like it as much as her continuous oxygen at home - saw pulmonology Welton Flakes) 07/24  4: Atrial fibrillation-  - dual chamber pacemaker placed June 2021 - has had multiple cardioversions - apixaban 5mg  BID - saw cardiology (Paraschos) 04/24  5: TAVR- - s/p Wheat and hemiarch procedure with a #46mm CE Magna bovine pericardial valve on 03/30/2007 for bicuspid aortic valve syndrome  - saw cardiothoracic provider Romeo Apple) 10/19/22 - TAVR done 11/02/22  6: Presumed stage I non-small cell lung cancer of the right upper lobe - - saw oncology Donneta Romberg) 07/24 - saw radiologist Aggie Cosier) 08/24; returns in 3 months for repeat CT scan - completed course of empiric radiation therapy

## 2023-04-14 ENCOUNTER — Telehealth: Payer: Self-pay | Admitting: Family

## 2023-04-14 ENCOUNTER — Encounter: Payer: Medicare Other | Admitting: Family

## 2023-04-14 NOTE — Telephone Encounter (Signed)
Patient did not show for her Heart Failure Clinic appointment on 04/14/23.

## 2023-04-20 ENCOUNTER — Telehealth: Payer: Self-pay | Admitting: *Deleted

## 2023-04-20 NOTE — Telephone Encounter (Signed)
Patient called asking if the radiation therapy could be the cause of her having low grade fever of 99.9 in the evenings and stuffy runny nose. I told her no and that she may have caught a cold or even have COVID. I asked that she get a COVID test and call back with results. She agreed to this

## 2023-04-21 ENCOUNTER — Encounter: Payer: Self-pay | Admitting: Nurse Practitioner

## 2023-04-21 ENCOUNTER — Telehealth: Payer: Medicare Other | Admitting: Nurse Practitioner

## 2023-04-21 VITALS — Ht <= 58 in | Wt 182.0 lb

## 2023-04-21 DIAGNOSIS — J0110 Acute frontal sinusitis, unspecified: Secondary | ICD-10-CM | POA: Diagnosis not present

## 2023-04-21 MED ORDER — AZITHROMYCIN 250 MG PO TABS
ORAL_TABLET | ORAL | 0 refills | Status: AC
Start: 2023-04-21 — End: 2023-04-26

## 2023-04-21 NOTE — Progress Notes (Signed)
[facility] [address] [place], [place] [address]  Internal MEDICINE  Telephone Visit  Patient Name: [name]  [id number]  [id number]  Date of Service: 04/21/2023  I connected with the patient at 1255 by telephone and verified the patients identity using two identifiers.   I discussed the limitations, risks, security and privacy concerns of performing an evaluation and management service by telephone and the availability of in person appointments. I also discussed with the patient that there may be a patient responsible charge related to the service.  The patient expressed understanding and agrees to proceed.    Chief Complaint  Patient presents with   Telephone Assessment   Telephone Screen   Sinusitis    Going on since friday   Headache   Fever    Low grade   Chills    HPI [name] presents for a telehealth virtual visit for sinus infection Onset of symptoms was Friday Nasal congestion, chills, fever, headache, fatigue, SOB, decreased appetite, and some nausea.  Denies cough   Current Medication: Outpatient Encounter Medications as of 04/21/2023  Medication Sig   ALPRAZolam (XANAX) 0.25 MG tablet Take 1 tablet (0.25 mg total) by mouth 2 (two) times daily as needed for anxiety.   amLODipine (NORVASC) 5 MG tablet Take 5 mg by mouth in the morning and at bedtime.   atorvastatin (LIPITOR) 40 MG tablet TAKE 1 TABLET BY MOUTH EVERY NIGHT AT BEDTIME   azithromycin (ZITHROMAX) 250 MG tablet Take 2 tablets on day 1, then 1 tablet daily on days 2 through 5   BREZTRI AEROSPHERE 160-9-4.8 MCG/ACT AERO INHALE 2 PUFFS INTO THE LUNGS TWICE DAILY   bumetanide (BUMEX) 1 MG tablet Take 2 tablets (2 mg total) by mouth 2 (two) times daily. And additional 2 tablets PM PRN   Calcium Carbonate-Vitamin D (CALCIUM 600+D PO) Take 1 tablet by mouth daily.   carvedilol (COREG) 25 MG tablet Take 25 mg by mouth 2 (two) times daily with a meal.   Coenzyme Q10 (COQ10) 200 MG CAPS Take 200  mg by mouth daily.   diclofenac Sodium (VOLTAREN) 1 % GEL Apply 2 g topically 4 (four) times daily.   ELIQUIS 5 MG TABS tablet TAKE 1 TABLET BY MOUTH TWICE DAILY   ferrous sulfate 325 (65 FE) MG tablet Take 1 tablet (325 mg total) by mouth daily with breakfast.   fluticasone (FLONASE) 50 MCG/ACT nasal spray Place 2 sprays into both nostrils daily.   ipratropium-albuterol (DUONEB) 0.5-2.5 (3) MG/3ML SOLN Inhale 3 mLs into the lungs every 4 (four) hours as needed. Inhale 3 mls into the lungs every 4 to 6 hours and as needed   JARDIANCE 10 MG TABS tablet TAKE 1 TABLET(10 MG) BY MOUTH DAILY BEFORE BREAKFAST (Patient taking differently: Take 10 mg by mouth daily.)   levothyroxine (SYNTHROID) 25 MCG tablet TAKE 1 TABLET BY MOUTH DAILY BEFORE BREAKFAST   lisinopril (ZESTRIL) 40 MG tablet TAKE 1 TABLET(40 MG) BY MOUTH DAILY (Patient taking differently: Take 40 mg by mouth daily.)   loratadine (CLARITIN) 10 MG tablet Take 10 mg by mouth daily.   oxyCODONE-acetaminophen (PERCOCET) 10-325 MG tablet Take 1 tablet by mouth 2 (two) times daily as needed for pain.   pantoprazole (PROTONIX) 40 MG tablet TAKE 1 TABLET BY MOUTH EVERY DAY   potassium chloride (KLOR-CON) 10 MEQ tablet Take 10 mEq by mouth 2 (two) times daily.   revefenacin (YUPELRI) 175 MCG/3ML nebulizer solution Take 3 mLs (175 mcg total) by nebulization  daily.   sertraline (ZOLOFT) 100 MG tablet TAKE 1 TABLET BY MOUTH ONCE A DAY FOR ANXIETY (Patient taking differently: Take 100 mg by mouth daily. TAKE 1 TABLET BY MOUTH ONCE A DAY FOR ANXIETY)   sodium chloride (OCEAN) 0.65 % SOLN nasal spray Place 1 spray into both nostrils as needed for congestion.   traZODone (DESYREL) 50 MG tablet TAKE 1 TABLET BY MOUTH EVERY DAY AT BEDTIME   VENTOLIN HFA 108 (90 Base) MCG/ACT inhaler INHALE 2 PUFFS INTO THE LUNGS EVERY 6 HOURS AS NEEDED FOR WHEEZING OR SHORTNESS OF BREATH   No facility-administered encounter medications on file as of 04/21/2023.    Surgical  History: Past Surgical History:  Procedure Laterality Date   ABDOMINAL AORTIC ANEURYSM REPAIR  2008   ABDOMINAL HYSTERECTOMY     AORTIC VALVE REPLACEMENT  2008   CARDIAC CATHETERIZATION     [facility]   CARDIOVERSION N/A 08/15/2018   Procedure: CARDIOVERSION;  Surgeon: Iran Ouch, MD;  Location: [facility] ORS;  Service: Cardiovascular;  Laterality: N/A;   CARDIOVERSION N/A 11/14/2018   Procedure: CARDIOVERSION (CATH LAB);  Surgeon: Iran Ouch, MD;  Location: [facility] ORS;  Service: Cardiovascular;  Laterality: N/A;   CARDIOVERSION N/A 06/05/2019   Procedure: CARDIOVERSION;  Surgeon: Iran Ouch, MD;  Location: [facility] ORS;  Service: Cardiovascular;  Laterality: N/A;   CARDIOVERSION N/A 08/14/2019   Procedure: CARDIOVERSION;  Surgeon: Iran Ouch, MD;  Location: [facility] ORS;  Service: Cardiovascular;  Laterality: N/A;   CARDIOVERSION N/A 11/09/2019   Procedure: CARDIOVERSION;  Surgeon: Marcina Millard, MD;  Location: [facility] ORS;  Service: Cardiovascular;  Laterality: N/A;   CARDIOVERSION N/A 01/17/2020   Procedure: CARDIOVERSION;  Surgeon: Marcina Millard, MD;  Location: [facility] ORS;  Service: Cardiovascular;  Laterality: N/A;   CARPAL TUNNEL RELEASE     ELECTROPHYSIOLOGIC STUDY N/A 08/17/2016   Procedure: Cardioversion;  Surgeon: Iran Ouch, MD;  Location: [facility] ORS;  Service: Cardiovascular;  Laterality: N/A;   TUMOR EXCISION Left    x3 (arm)    Medical History: Past Medical History:  Diagnosis Date   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 s/p bioprosthetic valve replacement 2008 at [facility];  b. 01/2015 Echo: EF 60-65%, no rwma, Gr1 DD, mildly dil LA, nl RV fxn.   Aspiration pneumonia ([name])    Atrial fibrillation with RVR ([name]) 01/11/2015   Atrial flutter ([name])    a. 08/2016 s/p DCCV.  Remains on flecainide 50 mg bid.   Basal skull fracture ([name]) 20 yrs ago   CHF (congestive heart failure) ([name])    Chronic respiratory failure ([name])    COPD (chronic obstructive  pulmonary disease) ([name])    a. 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 2008 prior to Aortic aneurysm repair-->nl cors.   History of stress test    a. 10/2015 MV: no ischemia/infarct.   HLD (hyperlipidemia)    HTN (hypertension)    Hypothyroidism    Obesity    PAF (paroxysmal atrial fibrillation) ([name])    a. on Eliquis; b. CHADS2VASc = 3 (HTN, age x 1, female).   Paroxysmal atrial fibrillation ([name]) 01/24/2015   Right upper quadrant abdominal tenderness without rebound tenderness 03/02/2018   S/P ascending aortic aneurysm repair 2008    Family History: Family History  Problem Relation Age of  Onset   Stroke Father    Stroke Paternal Grandmother     Social History   Socioeconomic History   Marital status: Divorced    Spouse name: Not on file   Number of children: Not on file   Years of education: Not on file   Highest education level: Not on file  Occupational History   Not on file  Tobacco Use   Smoking status: Former    Types: Cigarettes   Smokeless tobacco: Never  Vaping Use   Vaping status: Never Used  Substance and Sexual Activity   Alcohol use: No   Drug use: No   Sexual activity: Never    Birth control/protection: Surgical  Other Topics Concern   Not on file  Social History Narrative   Not on file   Social Determinants of Health   Financial Resource Strain: Low Risk  (11/03/2022)   Received from [facility]   Overall Financial Resource Strain (CARDIA)    Difficulty of Paying Living Expenses: Not hard at all  Recent Concern: Financial Resource Strain - Medium Risk (11/02/2022)   Received from [facility]   Overall Financial Resource Strain (CARDIA)    Difficulty of Paying Living Expenses: Somewhat hard  Food Insecurity: Food Insecurity Present (04/02/2023)   Hunger Vital  Sign    Worried About Running Out of Food in the Last Year: Sometimes true    Ran Out of Food in the Last Year: Sometimes true  Transportation Needs: No Transportation Needs (02/02/2023)   PRAPARE - Administrator, Civil Service (Medical): No    Lack of Transportation (Non-Medical): No  Physical Activity: Not on file  Stress: Not on file  Social Connections: Not on file  Intimate Partner Violence: Not At Risk (04/02/2023)   Humiliation, Afraid, Rape, and Kick questionnaire    Fear of Current or Ex-Partner: No    Emotionally Abused: No    Physically Abused: No    Sexually Abused: No      Review of Systems  Constitutional:  Positive for appetite change, chills, fatigue and fever.  HENT:  Positive for congestion, postnasal drip, rhinorrhea, sinus pressure and sinus pain.   Respiratory:  Positive for shortness of breath. Negative for cough, chest tightness and wheezing.   Cardiovascular: Negative.  Negative for chest pain and palpitations.  Gastrointestinal:  Positive for nausea.  Neurological:  Positive for headaches.    Vital Signs: Ht 4\' 10"  (1.473 m)   Wt 182 lb (82.6 kg)   BMI 38.04 kg/m    Observation/Objective: She is alert and oriented. No acute distress noted.     Assessment/Plan: 1. Acute non-recurrent frontal sinusitis Take zpak as prescribed.  - azithromycin (ZITHROMAX) 250 MG tablet; Take 2 tablets on day 1, then 1 tablet daily on days 2 through 5  Dispense: 6 tablet; Refill: 0   General Counseling: [name] verbalizes understanding of the findings of today's phone visit and agrees with plan of treatment. I have discussed any further diagnostic evaluation that may be needed or ordered today. We also reviewed her medications today. she has been encouraged to call the office with any questions or concerns that should arise related to todays visit.  Return if symptoms worsen or fail to improve.   No orders of the defined types were placed in this  encounter.   Meds ordered this encounter  Medications   azithromycin (ZITHROMAX) 250 MG tablet    Sig: Take 2  tablets on day 1, then 1 tablet daily on days 2 through 5    Dispense:  6 tablet    Refill:  0    Time spent:10 Minutes Time spent with patient included reviewing progress notes, labs, imaging studies, and discussing plan for follow up.  [place] Controlled Substance Database was reviewed by me for overdose risk score (ORS) if appropriate.  This patient was seen by Sallyanne Kuster, FNP-C in collaboration with Dr. Beverely Risen as a part of collaborative care agreement.   R. Tedd Sias, MSN, FNP-C Internal medicine

## 2023-04-22 ENCOUNTER — Telehealth: Payer: Self-pay

## 2023-04-22 NOTE — Telephone Encounter (Signed)
Pt.notified

## 2023-04-27 ENCOUNTER — Encounter: Payer: Self-pay | Admitting: Internal Medicine

## 2023-04-27 ENCOUNTER — Telehealth (INDEPENDENT_AMBULATORY_CARE_PROVIDER_SITE_OTHER): Payer: Medicare Other | Admitting: Internal Medicine

## 2023-04-27 VITALS — Resp 16 | Ht <= 58 in | Wt 182.0 lb

## 2023-04-27 DIAGNOSIS — E038 Other specified hypothyroidism: Secondary | ICD-10-CM

## 2023-04-27 DIAGNOSIS — R6883 Chills (without fever): Secondary | ICD-10-CM

## 2023-04-27 DIAGNOSIS — C3411 Malignant neoplasm of upper lobe, right bronchus or lung: Secondary | ICD-10-CM | POA: Diagnosis not present

## 2023-04-27 NOTE — Progress Notes (Signed)
[facility] [address] [place], [place] [address]  Internal MEDICINE  Telephone Visit  Patient Name: [name]  [id number]  [id number]  Date of Service: 05/11/2023  I connected with the patient at 1122 by telephone and verified the patients identity using two identifiers.   I discussed the limitations, risks, security and privacy concerns of performing an evaluation and management service by telephone and the availability of in person appointments. I also discussed with the patient that there may be a patient responsible charge related to the service.  The patient expressed understanding and agrees to proceed.    Chief Complaint  Patient presents with   Telephone Screen    Cold chills, then gets very hot keeps repeating, no fever   Telephone Assessment    HPI Patient is contacted due to concerns of her thyroid levels. She thinks that she has been on the same dose of Synthroid and the levels have not been checked for a while however patient does have updated labs in May with normal TSH and free T4    Current Medication: Outpatient Encounter Medications as of 04/27/2023  Medication Sig   amLODipine (NORVASC) 5 MG tablet Take 5 mg by mouth in the morning and at bedtime.   atorvastatin (LIPITOR) 40 MG tablet TAKE 1 TABLET BY MOUTH EVERY NIGHT AT BEDTIME   BREZTRI AEROSPHERE 160-9-4.8 MCG/ACT AERO INHALE 2 PUFFS INTO THE LUNGS TWICE DAILY   bumetanide (BUMEX) 1 MG tablet Take 2 tablets (2 mg total) by mouth 2 (two) times daily. And additional 2 tablets PM PRN   Calcium Carbonate-Vitamin D (CALCIUM 600+D PO) Take 1 tablet by mouth daily.   carvedilol (COREG) 25 MG tablet Take 25 mg by mouth 2 (two) times daily with a meal.   Coenzyme Q10 (COQ10) 200 MG CAPS Take 200 mg by mouth daily.   diclofenac Sodium (VOLTAREN) 1 % GEL Apply 2 g topically 4 (four) times daily.   ELIQUIS 5 MG TABS tablet TAKE 1 TABLET BY MOUTH TWICE DAILY   ferrous sulfate 325 (65 FE) MG tablet Take  1 tablet (325 mg total) by mouth daily with breakfast.   fluticasone (FLONASE) 50 MCG/ACT nasal spray Place 2 sprays into both nostrils daily.   ipratropium-albuterol (DUONEB) 0.5-2.5 (3) MG/3ML SOLN Inhale 3 mLs into the lungs every 4 (four) hours as needed. Inhale 3 mls into the lungs every 4 to 6 hours and as needed   levothyroxine (SYNTHROID) 25 MCG tablet TAKE 1 TABLET BY MOUTH DAILY BEFORE BREAKFAST   lisinopril (ZESTRIL) 40 MG tablet TAKE 1 TABLET(40 MG) BY MOUTH DAILY (Patient taking differently: Take 40 mg by mouth daily.)   loratadine (CLARITIN) 10 MG tablet Take 10 mg by mouth daily.   pantoprazole (PROTONIX) 40 MG tablet TAKE 1 TABLET BY MOUTH EVERY DAY   potassium chloride (KLOR-CON) 10 MEQ tablet Take 10 mEq by mouth 2 (two) times daily.   revefenacin (YUPELRI) 175 MCG/3ML nebulizer solution Take 3 mLs (175 mcg total) by nebulization daily.   sertraline (ZOLOFT) 100 MG tablet TAKE 1 TABLET BY MOUTH ONCE A DAY FOR ANXIETY (Patient taking differently: Take 100 mg by mouth daily. TAKE 1 TABLET BY MOUTH ONCE A DAY FOR ANXIETY)   sodium chloride (OCEAN) 0.65 % SOLN nasal spray Place 1 spray into both nostrils as needed for congestion.   traZODone (DESYREL) 50 MG tablet TAKE 1 TABLET BY MOUTH EVERY DAY AT BEDTIME   VENTOLIN HFA 108 (90 Base) MCG/ACT inhaler INHALE 2  PUFFS INTO THE LUNGS EVERY 6 HOURS AS NEEDED FOR WHEEZING OR SHORTNESS OF BREATH   [DISCONTINUED] ALPRAZolam (XANAX) 0.25 MG tablet Take 1 tablet (0.25 mg total) by mouth 2 (two) times daily as needed for anxiety.   [DISCONTINUED] JARDIANCE 10 MG TABS tablet TAKE 1 TABLET(10 MG) BY MOUTH DAILY BEFORE BREAKFAST (Patient taking differently: Take 10 mg by mouth daily.)   [DISCONTINUED] oxyCODONE-acetaminophen (PERCOCET) 10-325 MG tablet Take 1 tablet by mouth 2 (two) times daily as needed for pain.   No facility-administered encounter medications on file as of 04/27/2023.    Surgical History: Past Surgical History:  Procedure  Laterality Date   ABDOMINAL AORTIC ANEURYSM REPAIR  2008   ABDOMINAL HYSTERECTOMY     AORTIC VALVE REPLACEMENT  2008   CARDIAC CATHETERIZATION     [facility]   CARDIOVERSION N/A 08/15/2018   Procedure: CARDIOVERSION;  Surgeon: Iran Ouch, MD;  Location: [facility] ORS;  Service: Cardiovascular;  Laterality: N/A;   CARDIOVERSION N/A 11/14/2018   Procedure: CARDIOVERSION (CATH LAB);  Surgeon: Iran Ouch, MD;  Location: [facility] ORS;  Service: Cardiovascular;  Laterality: N/A;   CARDIOVERSION N/A 06/05/2019   Procedure: CARDIOVERSION;  Surgeon: Iran Ouch, MD;  Location: [facility] ORS;  Service: Cardiovascular;  Laterality: N/A;   CARDIOVERSION N/A 08/14/2019   Procedure: CARDIOVERSION;  Surgeon: Iran Ouch, MD;  Location: [facility] ORS;  Service: Cardiovascular;  Laterality: N/A;   CARDIOVERSION N/A 11/09/2019   Procedure: CARDIOVERSION;  Surgeon: Marcina Millard, MD;  Location: [facility] ORS;  Service: Cardiovascular;  Laterality: N/A;   CARDIOVERSION N/A 01/17/2020   Procedure: CARDIOVERSION;  Surgeon: Marcina Millard, MD;  Location: [facility] ORS;  Service: Cardiovascular;  Laterality: N/A;   CARPAL TUNNEL RELEASE     ELECTROPHYSIOLOGIC STUDY N/A 08/17/2016   Procedure: Cardioversion;  Surgeon: Iran Ouch, MD;  Location: [facility] ORS;  Service: Cardiovascular;  Laterality: N/A;   TUMOR EXCISION Left    x3 (arm)    Medical History: Past Medical History:  Diagnosis Date   Aortic 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 s/p bioprosthetic valve replacement 2008 at [facility];  b. 01/2015 Echo: EF 60-65%, no rwma, Gr1 DD, mildly dil LA, nl RV fxn.   Aspiration pneumonia ([name])    Atrial fibrillation with RVR ([name]) 01/11/2015   Atrial flutter ([name])    a. 08/2016 s/p DCCV.  Remains on flecainide 50 mg bid.   Basal skull fracture ([name]) 20 yrs ago   CHF (congestive heart failure) ([name])    Chronic respiratory failure ([name])    COPD (chronic obstructive pulmonary disease) ([name])    a. 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 2008 prior to Aortic aneurysm repair-->nl cors.   History of stress test    a. 10/2015 MV: no ischemia/infarct.   HLD (hyperlipidemia)    HTN (hypertension)    Hypothyroidism    Obesity    PAF (paroxysmal atrial fibrillation) ([name])    a. on Eliquis; b. CHADS2VASc = 3 (HTN, age x 1, female).   Paroxysmal atrial fibrillation ([name]) 01/24/2015   Right upper quadrant abdominal tenderness without rebound tenderness 03/02/2018   S/P ascending aortic aneurysm repair 2008    Family History: Family History  Problem Relation Age of Onset   Stroke Father    Stroke Paternal Grandmother  Social History   Socioeconomic History   Marital status: Divorced    Spouse name: Not on file   Number of children: Not on file   Years of education: Not on file   Highest education level: Not on file  Occupational History   Not on file  Tobacco Use   Smoking status: Former    Types: Cigarettes   Smokeless tobacco: Never  Vaping Use   Vaping status: Never Used  Substance and Sexual Activity   Alcohol use: No   Drug use: No   Sexual activity: Never    Birth control/protection: Surgical  Other Topics Concern   Not on file  Social History Narrative   Not on file   Social Determinants of Health   Financial Resource Strain: Low Risk  (11/03/2022)   Received from [facility]   Overall Financial Resource Strain (CARDIA)    Difficulty of Paying Living Expenses: Not hard at all  Recent Concern: Financial Resource Strain - Medium Risk (11/02/2022)   Received from [facility]   Overall Financial Resource Strain (CARDIA)    Difficulty of Paying Living Expenses: Somewhat hard  Food Insecurity: Food Insecurity Present (04/02/2023)   Hunger Vital Sign    Worried About Running Out of Food in the  Last Year: Sometimes true    Ran Out of Food in the Last Year: Sometimes true  Transportation Needs: No Transportation Needs (02/02/2023)   PRAPARE - Administrator, Civil Service (Medical): No    Lack of Transportation (Non-Medical): No  Physical Activity: Not on file  Stress: Not on file  Social Connections: Not on file  Intimate Partner Violence: Not At Risk (04/02/2023)   Humiliation, Afraid, Rape, and Kick questionnaire    Fear of Current or Ex-Partner: No    Emotionally Abused: No    Physically Abused: No    Sexually Abused: No      Review of Systems  Constitutional:  Positive for chills.  Endocrine: Positive for cold intolerance.    Vital Signs: Resp 16   Ht 4\' 10"  (1.473 m)   Wt 182 lb (82.6 kg)   BMI 38.04 kg/m    Observation/Objective: Patient looks comfortable    Assessment/Plan: 1. Chills (without fever) Reassurance is given to the patient we will order labs and then modify treatment plan as needed - CBC with Differential/Platelet - Comprehensive metabolic panel - TSH + free T4 - B12 and Folate Panel  2. Other specified hypothyroidism Continue Synthroid as before  3. Malignant neoplasm of right upper lobe of lung [facility]) Patient is followed by oncology  General Counseling: Hattye verbalizes understanding of the findings of today's phone visit and agrees with plan of treatment. I have discussed any further diagnostic evaluation that may be needed or ordered today. We also reviewed her medications today. she has been encouraged to call the office with any questions or concerns that should arise related to todays visit.    Orders Placed This Encounter  Procedures   CBC with Differential/Platelet   Comprehensive metabolic panel   TSH + free T4   B12 and Folate Panel    No orders of the defined types were placed in this encounter.   Time spent:20 Minutes    Dr Lyndon Code Internal medicine

## 2023-04-28 LAB — COMPREHENSIVE METABOLIC PANEL
ALT: 13 IU/L (ref 0–32)
AST: 15 IU/L (ref 0–40)
Albumin: 4 g/dL (ref 3.8–4.8)
Alkaline Phosphatase: 93 IU/L (ref 44–121)
BUN/Creatinine Ratio: 17 (ref 12–28)
BUN: 15 mg/dL (ref 8–27)
Bilirubin Total: 0.2 mg/dL (ref 0.0–1.2)
CO2: 24 mmol/L (ref 20–29)
Calcium: 9.2 mg/dL (ref 8.7–10.3)
Chloride: 98 mmol/L (ref 96–106)
Creatinine, Ser: 0.86 mg/dL (ref 0.57–1.00)
Globulin, Total: 2.8 g/dL (ref 1.5–4.5)
Glucose: 119 mg/dL — ABNORMAL HIGH (ref 70–99)
Potassium: 3.8 mmol/L (ref 3.5–5.2)
Sodium: 138 mmol/L (ref 134–144)
Total Protein: 6.8 g/dL (ref 6.0–8.5)
eGFR: 70 mL/min/{1.73_m2} (ref >59)

## 2023-04-28 LAB — CBC WITH DIFFERENTIAL/PLATELET
Basophils Absolute: 0 10*3/uL (ref 0.0–0.2)
Basos: 1 %
EOS (ABSOLUTE): 0.2 10*3/uL (ref 0.0–0.4)
Eos: 4 %
Hematocrit: 33.6 % — ABNORMAL LOW (ref 34.0–46.6)
Hemoglobin: 10.5 g/dL — ABNORMAL LOW (ref 11.1–15.9)
Immature Grans (Abs): 0 10*3/uL (ref 0.0–0.1)
Immature Granulocytes: 0 %
Lymphocytes Absolute: 0.9 10*3/uL (ref 0.7–3.1)
Lymphs: 16 %
MCH: 26.6 pg (ref 26.6–33.0)
MCHC: 31.3 g/dL — ABNORMAL LOW (ref 31.5–35.7)
MCV: 85 fL (ref 79–97)
Monocytes Absolute: 0.7 10*3/uL (ref 0.1–0.9)
Monocytes: 11 %
Neutrophils Absolute: 3.9 10*3/uL (ref 1.4–7.0)
Neutrophils: 68 %
Platelets: 273 10*3/uL (ref 150–450)
RBC: 3.95 x10E6/uL (ref 3.77–5.28)
RDW: 16.4 % — ABNORMAL HIGH (ref 11.7–15.4)
WBC: 5.7 10*3/uL (ref 3.4–10.8)

## 2023-04-28 LAB — B12 AND FOLATE PANEL
Folate: 13.1 ng/mL (ref >3.0)
Vitamin B-12: 512 pg/mL (ref 232–1245)

## 2023-04-28 LAB — TSH+FREE T4
Free T4: 1.12 ng/dL (ref 0.82–1.77)
TSH: 3.65 u[IU]/mL (ref 0.450–4.500)

## 2023-05-03 ENCOUNTER — Telehealth: Payer: Self-pay

## 2023-05-03 DIAGNOSIS — G4733 Obstructive sleep apnea (adult) (pediatric): Secondary | ICD-10-CM | POA: Diagnosis not present

## 2023-05-03 DIAGNOSIS — J449 Chronic obstructive pulmonary disease, unspecified: Secondary | ICD-10-CM | POA: Diagnosis not present

## 2023-05-04 ENCOUNTER — Telehealth: Payer: Self-pay

## 2023-05-04 NOTE — Telephone Encounter (Signed)
Spoke to patient, she hasn't seen a GI in a few years but saw on Oncology at the cancer [facility].Per Dfk send Dr. Louretta Shorten a message to get him to review her labs.

## 2023-05-04 NOTE — Telephone Encounter (Signed)
Oncology got back with me and they have called her and left a voicemail so they can get her on their schedule.

## 2023-05-04 NOTE — Telephone Encounter (Signed)
-----   Message from [facility] sent at 05/04/2023 12:41 PM EDT ----- Her hg is low but other labs are normal, can you see who is her GI and call me with their number

## 2023-05-04 NOTE — Progress Notes (Signed)
Her hg is low but other labs are normal, can you see who is her GI and call me with their number

## 2023-05-04 NOTE — Telephone Encounter (Signed)
Pt notified by Vanice Sarah

## 2023-05-05 ENCOUNTER — Telehealth: Payer: Self-pay

## 2023-05-05 DIAGNOSIS — G8929 Other chronic pain: Secondary | ICD-10-CM

## 2023-05-05 DIAGNOSIS — F411 Generalized anxiety disorder: Secondary | ICD-10-CM

## 2023-05-06 ENCOUNTER — Other Ambulatory Visit: Payer: Self-pay

## 2023-05-06 DIAGNOSIS — D649 Anemia, unspecified: Secondary | ICD-10-CM

## 2023-05-06 DIAGNOSIS — R911 Solitary pulmonary nodule: Secondary | ICD-10-CM

## 2023-05-06 MED ORDER — OXYCODONE-ACETAMINOPHEN 10-325 MG PO TABS
1.0000 | ORAL_TABLET | Freq: Two times a day (BID) | ORAL | 0 refills | Status: DC | PRN
Start: 2023-05-06 — End: 2023-06-02

## 2023-05-06 MED ORDER — ALPRAZOLAM 0.25 MG PO TABS
0.2500 mg | ORAL_TABLET | Freq: Two times a day (BID) | ORAL | 1 refills | Status: DC | PRN
Start: 2023-05-06 — End: 2023-07-14

## 2023-05-06 NOTE — Telephone Encounter (Signed)
Pt advised we sent med  

## 2023-05-07 ENCOUNTER — Ambulatory Visit
Admission: RE | Admit: 2023-05-07 | Discharge: 2023-05-07 | Disposition: A | Discharge status: Home / Self Care | Payer: Medicare Other | Source: Ambulatory Visit | Attending: Nurse Practitioner | Admitting: Nurse Practitioner

## 2023-05-07 ENCOUNTER — Ambulatory Visit: Payer: Medicare Other | Admitting: Nurse Practitioner

## 2023-05-07 ENCOUNTER — Encounter: Payer: Self-pay | Admitting: Nurse Practitioner

## 2023-05-07 ENCOUNTER — Other Ambulatory Visit: Payer: Medicare Other

## 2023-05-07 ENCOUNTER — Inpatient Hospital Stay (HOSPITAL_BASED_OUTPATIENT_CLINIC_OR_DEPARTMENT_OTHER): Payer: Medicare Other | Admitting: Nurse Practitioner

## 2023-05-07 ENCOUNTER — Inpatient Hospital Stay: Payer: Medicare Other | Attending: Internal Medicine

## 2023-05-07 VITALS — BP 96/79 | HR 73 | Temp 97.3°F | Ht <= 58 in | Wt 182.0 lb

## 2023-05-07 DIAGNOSIS — R0989 Other specified symptoms and signs involving the circulatory and respiratory systems: Secondary | ICD-10-CM | POA: Diagnosis not present

## 2023-05-07 DIAGNOSIS — D649 Anemia, unspecified: Secondary | ICD-10-CM

## 2023-05-07 DIAGNOSIS — Z87891 Personal history of nicotine dependence: Secondary | ICD-10-CM | POA: Diagnosis not present

## 2023-05-07 DIAGNOSIS — R6883 Chills (without fever): Secondary | ICD-10-CM | POA: Insufficient documentation

## 2023-05-07 DIAGNOSIS — R232 Flushing: Secondary | ICD-10-CM | POA: Diagnosis not present

## 2023-05-07 DIAGNOSIS — R911 Solitary pulmonary nodule: Secondary | ICD-10-CM | POA: Diagnosis not present

## 2023-05-07 DIAGNOSIS — Z923 Personal history of irradiation: Secondary | ICD-10-CM | POA: Diagnosis not present

## 2023-05-07 DIAGNOSIS — Z0389 Encounter for observation for other suspected diseases and conditions ruled out: Secondary | ICD-10-CM | POA: Diagnosis not present

## 2023-05-07 DIAGNOSIS — K589 Irritable bowel syndrome without diarrhea: Secondary | ICD-10-CM

## 2023-05-07 DIAGNOSIS — R110 Nausea: Secondary | ICD-10-CM

## 2023-05-07 DIAGNOSIS — K8020 Calculus of gallbladder without cholecystitis without obstruction: Secondary | ICD-10-CM

## 2023-05-07 DIAGNOSIS — I517 Cardiomegaly: Secondary | ICD-10-CM | POA: Diagnosis not present

## 2023-05-07 LAB — CBC WITH DIFFERENTIAL/PLATELET
Abs Immature Granulocytes: 0.07 10*3/uL (ref 0.00–0.07)
Basophils Absolute: 0 10*3/uL (ref 0.0–0.1)
Basophils Relative: 0 %
Eosinophils Absolute: 0.2 10*3/uL (ref 0.0–0.5)
Eosinophils Relative: 4 %
HCT: 33.8 % — ABNORMAL LOW (ref 36.0–46.0)
Hemoglobin: 10.3 g/dL — ABNORMAL LOW (ref 12.0–15.0)
Immature Granulocytes: 1 %
Lymphocytes Relative: 15 %
Lymphs Abs: 0.7 10*3/uL (ref 0.7–4.0)
MCH: 27.1 pg (ref 26.0–34.0)
MCHC: 30.5 g/dL (ref 30.0–36.0)
MCV: 88.9 fL (ref 80.0–100.0)
Monocytes Absolute: 0.5 10*3/uL (ref 0.1–1.0)
Monocytes Relative: 11 %
Neutro Abs: 3.3 10*3/uL (ref 1.7–7.7)
Neutrophils Relative %: 69 %
Platelets: 203 10*3/uL (ref 150–400)
RBC: 3.8 MIL/uL — ABNORMAL LOW (ref 3.87–5.11)
RDW: 16.7 % — ABNORMAL HIGH (ref 11.5–15.5)
WBC: 4.8 10*3/uL (ref 4.0–10.5)
nRBC: 0 % (ref 0.0–0.2)

## 2023-05-07 LAB — IRON AND TIBC
Iron: 71 ug/dL (ref 28–170)
Saturation Ratios: 23 % (ref 10.4–31.8)
TIBC: 309 ug/dL (ref 250–450)
UIBC: 238 ug/dL

## 2023-05-07 LAB — LACTATE DEHYDROGENASE: LDH: 137 U/L (ref 98–192)

## 2023-05-07 LAB — RETIC PANEL
Immature Retic Fract: 15.1 % (ref 2.3–15.9)
RBC.: 3.8 MIL/uL — ABNORMAL LOW (ref 3.87–5.11)
Retic Count, Absolute: 87 10*3/uL (ref 19.0–186.0)
Retic Ct Pct: 2.3 % (ref 0.4–3.1)
Reticulocyte Hemoglobin: 29.3 pg (ref >27.9)

## 2023-05-07 LAB — VITAMIN B12: Vitamin B-12: 341 pg/mL (ref 180–914)

## 2023-05-07 LAB — FERRITIN: Ferritin: 295 ng/mL (ref 11–307)

## 2023-05-07 NOTE — Progress Notes (Signed)
[facility] CONSULT NOTE  Patient Care Team: Lyndon Code, MD as PCP - General (Internal Medicine) Iran Ouch, MD as PCP - Cardiology (Cardiology) Callaway, Marylene Land, [facility] (Inactive) as Pharmacist (Pharmacist) [name], MD as Consulting Physician (Radiation Oncology)  CHIEF COMPLAINTS/PURPOSE OF CONSULTATION: Anemia, Chills, Hot flashes  #Chronic intermittent anemia since 2017-mild; since 2022-hemoglobin 8-9; MCV 100s; July 2022-SPEP normal; folic acid B12/DAT/haptoglobin/LDH-within normal limits; iron saturation 32% ferritin 42.  TSH normal;  elevated reticulocyte count/elevated erythropoietin level.  Pathologist review of smear-no schistocytes or spherocytes/hemolysis  # COPD [Dr.Saadat Khan/ CHF- Dr.Parashoes]; 2008 [bioprosthetic valve aortic].  # Lung Nodule  # Anemia-  Oncology History   No history exists.     HISTORY OF PRESENTING ILLNESS: Alone. In wheelchair. On oxygen via [place] at 5L.  [name] 77 y.o.  female patient with multiple medical problems including aortic stenosis bioprosthetic valve; A. fib on  eliquis; chronic respiratory failure [CHF/COPD]-on 3.5 L of oxygen; with morbid obesity presents to clinic for evaluation of anemia. She was seen by pcp via telemedicine last week for complaints of hot flashes and cold chills. As part of workup she was told that she was anemic and presents for evaluation.   Today, she says that the hot flashes and cold chills have been occurring for 3 weeks. Not worsening but are bothersome. Denies fevers. Says she was treated for pneumonia a month or so ago. She says she changes clothes several times a day due to temperature fluctuations but denies sweats.   She is unaware of anemia history and feels this is a new problem but says she was put on oral iron years ago for anemia. She has black stools at baseline which is unchanged.   She complains of nausea in the morning without vomiting. Says her upper abdomen  becomes painful. She has a history of gallbladder pain which she says has occurred for many years. Hasn't seen GI in many years. Upon chart review it eappears that she has been followed by [facility]. Most recently saw Dr Servando Snare in 2022. She has a hsitory of irritable bowel which she denies. Was previously taking bentyl. Was previously told she could not have endoscopy or colonoscopy due to her respiratory status.   She was previously noted to have RUL lung nodule, poor candidate for biopsy d/t respiratory status, underwent radiation/SBRT, completed March 12, 2023. CT showed increase in size of lung nodule which was felt to be inflammatory and she has follow up scan scheduled for November 2024. She says her breathing is poor but unchanged. No chest pain. She has exertional dyspnea. She wears oxygen 24/7. She denies worsening cough. Her fatigue is chronic and unchanged. She has chronic arthritic changes which are unchanged. No new lumps or bumps. Has not had a mammogram in many years.   Review of Systems  Constitutional:  Positive for chills and malaise/fatigue. Negative for diaphoresis, fever and weight loss.  HENT:  Positive for hearing loss. Negative for congestion, nosebleeds, sinus pain and sore throat.   Eyes:  Negative for double vision.  Respiratory:  Positive for cough and shortness of breath. Negative for hemoptysis, sputum production and wheezing.   Cardiovascular:  Negative for chest pain, palpitations, orthopnea and leg swelling.  Gastrointestinal:  Positive for abdominal pain and nausea. Negative for blood in stool, constipation, diarrhea, heartburn, melena and vomiting.  Genitourinary:  Negative for dysuria, flank pain, frequency, hematuria and urgency.  Musculoskeletal:  Positive for joint pain. Negative  for back pain, myalgias and neck pain.  Skin:  Negative for itching and rash.  Neurological:  Negative for dizziness, tingling, focal weakness, weakness and headaches.   Endo/Heme/Allergies:  Negative for environmental allergies. Does not bruise/bleed easily.  Psychiatric/Behavioral:  Negative for depression. The patient is nervous/anxious. The patient does not have insomnia.      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 s/p bioprosthetic valve replacement 2008 at [facility];  b. 01/2015 Echo: EF 60-65%, no rwma, Gr1 DD, mildly dil LA, nl RV fxn.   Aspiration pneumonia ([name])    Atrial fibrillation with RVR ([name]) 01/11/2015   Atrial flutter ([name])    a. 08/2016 s/p DCCV.  Remains on flecainide 50 mg bid.   Basal skull fracture ([name]) 20 yrs ago   CHF (congestive heart failure) ([name])    Chronic respiratory failure ([name])    COPD (chronic obstructive pulmonary disease) ([name])    a. 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 2008 prior to Aortic aneurysm repair-->nl cors.   History of stress test    a. 10/2015 MV: no ischemia/infarct.   HLD (hyperlipidemia)    HTN (hypertension)    Hypothyroidism    Obesity    PAF (paroxysmal atrial fibrillation) ([name])    a. on Eliquis; b. CHADS2VASc = 3 (HTN, age x 1, female).   Paroxysmal atrial fibrillation ([name]) 01/24/2015   Right upper quadrant abdominal tenderness without rebound tenderness 03/02/2018   S/P ascending aortic aneurysm repair 2008    SURGICAL HISTORY: Past Surgical History:  Procedure Laterality Date   ABDOMINAL AORTIC ANEURYSM REPAIR  2008   ABDOMINAL HYSTERECTOMY     AORTIC VALVE REPLACEMENT  2008   CARDIAC CATHETERIZATION     [facility]   CARDIOVERSION N/A 08/15/2018   Procedure: CARDIOVERSION;  Surgeon: Iran Ouch, MD;  Location: [facility] ORS;  Service: Cardiovascular;  Laterality: N/A;   CARDIOVERSION N/A 11/14/2018   Procedure: CARDIOVERSION (CATH LAB);  Surgeon: Iran Ouch, MD;  Location: [facility] ORS;  Service:  Cardiovascular;  Laterality: N/A;   CARDIOVERSION N/A 06/05/2019   Procedure: CARDIOVERSION;  Surgeon: Iran Ouch, MD;  Location: [facility] ORS;  Service: Cardiovascular;  Laterality: N/A;   CARDIOVERSION N/A 08/14/2019   Procedure: CARDIOVERSION;  Surgeon: Iran Ouch, MD;  Location: [facility] ORS;  Service: Cardiovascular;  Laterality: N/A;   CARDIOVERSION N/A 11/09/2019   Procedure: CARDIOVERSION;  Surgeon: Marcina Millard, MD;  Location: [facility] ORS;  Service: Cardiovascular;  Laterality: N/A;   CARDIOVERSION N/A 01/17/2020   Procedure: CARDIOVERSION;  Surgeon: Marcina Millard, MD;  Location: [facility] ORS;  Service: Cardiovascular;  Laterality: N/A;   CARPAL TUNNEL RELEASE     ELECTROPHYSIOLOGIC STUDY N/A 08/17/2016   Procedure: Cardioversion;  Surgeon: Iran Ouch, MD;  Location: [facility] ORS;  Service: Cardiovascular;  Laterality: N/A;   TUMOR EXCISION Left    x3 (arm)    SOCIAL HISTORY: Social History   Socioeconomic History   Marital status: Divorced    Spouse name: Not on file   Number of children: Not on file   Years of education: Not on file   Highest education level: Not on file  Occupational History   Not on file  Tobacco Use   Smoking status: Former  Types: Cigarettes   Smokeless tobacco: Never  Vaping Use   Vaping status: Never Used  Substance and Sexual Activity   Alcohol use: No   Drug use: No   Sexual activity: Never    Birth control/protection: Surgical  Other Topics Concern   Not on file  Social History Narrative   Not on file   Social Determinants of Health   Financial Resource Strain: Low Risk  (11/03/2022)   Received from [facility], [facility]   Overall Financial Resource Strain (CARDIA)    Difficulty of Paying Living Expenses: Not hard at all  Recent Concern: Financial Resource Strain - Medium Risk (11/02/2022)   Received from [facility]   Overall Financial Resource Strain (CARDIA)     Difficulty of Paying Living Expenses: Somewhat hard  Food Insecurity: Food Insecurity Present (04/02/2023)   Hunger Vital Sign    Worried About Running Out of Food in the Last Year: Sometimes true    Ran Out of Food in the Last Year: Sometimes true  Transportation Needs: No Transportation Needs (02/02/2023)   PRAPARE - Administrator, Civil Service (Medical): No    Lack of Transportation (Non-Medical): No  Physical Activity: Not on file  Stress: Not on file  Social Connections: Not on file  Intimate Partner Violence: Not At Risk (04/02/2023)   Humiliation, Afraid, Rape, and Kick questionnaire    Fear of Current or Ex-Partner: No    Emotionally Abused: No    Physically Abused: No    Sexually Abused: No    FAMILY HISTORY: Family History  Problem Relation Age of Onset   Stroke Father    Stroke Paternal Grandmother     ALLERGIES:  is allergic to benadryl [diphenhydramine hcl (sleep)], cetirizine, lasix [furosemide], levaquin [levofloxacin in d5w], meloxicam, and sulfa antibiotics.  MEDICATIONS:  Current Outpatient Medications  Medication Sig Dispense Refill   ALPRAZolam (XANAX) 0.25 MG tablet Take 1 tablet (0.25 mg total) by mouth 2 (two) times daily as needed for anxiety. 60 tablet 1   amLODipine (NORVASC) 5 MG tablet Take 5 mg by mouth in the morning and at bedtime.     atorvastatin (LIPITOR) 40 MG tablet TAKE 1 TABLET BY MOUTH EVERY NIGHT AT BEDTIME 90 tablet 1   BREZTRI AEROSPHERE 160-9-4.8 MCG/ACT AERO INHALE 2 PUFFS INTO THE LUNGS TWICE DAILY 10.7 g 11   bumetanide (BUMEX) 1 MG tablet Take 2 tablets (2 mg total) by mouth 2 (two) times daily. And additional 2 tablets PM PRN 360 tablet 3   Calcium Carbonate-Vitamin D (CALCIUM 600+D PO) Take 1 tablet by mouth daily.     carvedilol (COREG) 25 MG tablet Take 25 mg by mouth 2 (two) times daily with a meal.     Coenzyme Q10 (COQ10) 200 MG CAPS Take 200 mg by mouth daily.     diclofenac Sodium (VOLTAREN) 1 % GEL Apply 2  g topically 4 (four) times daily.     ELIQUIS 5 MG TABS tablet TAKE 1 TABLET BY MOUTH TWICE DAILY 180 tablet 2   ferrous sulfate 325 (65 FE) MG tablet Take 1 tablet (325 mg total) by mouth daily with breakfast.  3   fluticasone (FLONASE) 50 MCG/ACT nasal spray Place 2 sprays into both nostrils daily.  2   ipratropium-albuterol (DUONEB) 0.5-2.5 (3) MG/3ML SOLN Inhale 3 mLs into the lungs every 4 (four) hours as needed. Inhale 3 mls into the lungs every 4 to 6 hours and as  needed 360 mL 3   JARDIANCE 10 MG TABS tablet TAKE 1 TABLET(10 MG) BY MOUTH DAILY BEFORE BREAKFAST (Patient taking differently: Take 10 mg by mouth daily.) 30 tablet 5   levothyroxine (SYNTHROID) 25 MCG tablet TAKE 1 TABLET BY MOUTH DAILY BEFORE BREAKFAST 90 tablet 1   lisinopril (ZESTRIL) 40 MG tablet TAKE 1 TABLET(40 MG) BY MOUTH DAILY (Patient taking differently: Take 40 mg by mouth daily.) 90 tablet 3   loratadine (CLARITIN) 10 MG tablet Take 10 mg by mouth daily.     oxyCODONE-acetaminophen (PERCOCET) 10-325 MG tablet Take 1 tablet by mouth 2 (two) times daily as needed for pain. 60 tablet 0   pantoprazole (PROTONIX) 40 MG tablet TAKE 1 TABLET BY MOUTH EVERY DAY 90 tablet 1   potassium chloride (KLOR-CON) 10 MEQ tablet Take 10 mEq by mouth 2 (two) times daily.     revefenacin (YUPELRI) 175 MCG/3ML nebulizer solution Take 3 mLs (175 mcg total) by nebulization daily. 90 mL 5   sertraline (ZOLOFT) 100 MG tablet TAKE 1 TABLET BY MOUTH ONCE A DAY FOR ANXIETY (Patient taking differently: Take 100 mg by mouth daily. TAKE 1 TABLET BY MOUTH ONCE A DAY FOR ANXIETY) 90 tablet 2   sodium chloride (OCEAN) 0.65 % SOLN nasal spray Place 1 spray into both nostrils as needed for congestion.     traZODone (DESYREL) 50 MG tablet TAKE 1 TABLET BY MOUTH EVERY DAY AT BEDTIME 90 tablet 1   VENTOLIN HFA 108 (90 Base) MCG/ACT inhaler INHALE 2 PUFFS INTO THE LUNGS EVERY 6 HOURS AS NEEDED FOR WHEEZING OR SHORTNESS OF BREATH 18 g 3   No current  facility-administered medications for this visit.    PHYSICAL EXAMINATION: ECOG PERFORMANCE STATUS: 2 - Symptomatic, <50% confined to bed Vitals:   05/07/23 1046  BP: 96/79  Pulse: 73  Temp: (!) 97.3 F (36.3 C)  SpO2: 93%   Filed Weights   05/07/23 1046  Weight: 182 lb (82.6 kg)    Physical Exam Vitals reviewed.  Constitutional:      General: She is not in acute distress.    Appearance: She is well-developed. She is morbidly obese.     Interventions: Nasal cannula in place.  HENT:     Head: Normocephalic and atraumatic.     Right Ear: External ear normal.     Left Ear: External ear normal.     Nose: No congestion.     Mouth/Throat:     Pharynx: Oropharynx is clear. No oropharyngeal exudate.  Cardiovascular:     Rate and Rhythm: Normal rate and regular rhythm.     Heart sounds: Murmur heard.  Pulmonary:     Comments: Breath sounds are diminished bilaterally Abdominal:     General: There is no distension.     Palpations: Abdomen is soft.     Tenderness: There is no abdominal tenderness. There is no guarding or rebound.     Hernia: No hernia is present.  Musculoskeletal:        General: No deformity.     Cervical back: No rigidity.     Comments: wheelchair  Lymphadenopathy:     Cervical: No cervical adenopathy.  Skin:    General: Skin is warm.     Coloration: Skin is not jaundiced or pale.     Findings: No bruising.  Neurological:     General: No focal deficit present.     Mental Status: She is alert and oriented to person, place, and time.  Psychiatric:  Mood and Affect: Mood is anxious.        Behavior: Behavior is cooperative.     LABORATORY DATA:  I have reviewed the data as listed Lab Results  Component Value Date   WBC 5.7 04/27/2023   HGB 10.5 (L) 04/27/2023   HCT 33.6 (L) 04/27/2023   MCV 85 04/27/2023   PLT 273 04/27/2023   Iron/TIBC/Ferritin/ %Sat    Component Value Date/Time   IRON 156 03/14/2021 1532   IRON 157 (H) 03/11/2021  1114   IRON 60 06/24/2012 1428   TIBC 463 (H) 03/14/2021 1532   TIBC 405 03/11/2021 1114   TIBC 396 06/24/2012 1428   FERRITIN 42 03/14/2021 1532   FERRITIN 70 03/11/2021 1114   FERRITIN 31 06/24/2012 1428   IRONPCTSAT 34 (H) 03/14/2021 1532   IRONPCTSAT 39 03/11/2021 1114   IRONPCTSAT 15 06/24/2012 1428      RADIOGRAPHIC STUDIES: I have personally reviewed the radiological images as listed and agreed with the findings in the report. No results found.  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 JULY 2024- Interval enlargement of previously noted right upper lobe pulmonary nodule which has a very aggressive appearance, highly concerning for primary bronchogenic neoplasm. This currently measures 1.1 x 1.2 x 1.6 cm and has spiculations extending to the overlying pleura superiorly which appears mildly thickened. Patient saw Dr Donneta Romberg who felt that findings suggestive of radiation changes and not suggestive of progressive diease. Plan to repeat imaging in November. She will follow up with Dr Rushie Chestnut as scheduled for results and management.   # Chronic anemia- mild. Normocytic. She endorses hx of IDA and takes oral iron. Iron studies pending at time of visit but have since resulted and ferritin is well replenished at 295, Iron sat 23%. No role for IDA. Her hemoglobin is largely stable and within her baseline of 10-11. Today 10.3. I reviewed that it is unlikely that her chills or fatigue are related to her chronic anemia. I suspect etiology of her anemia is anemia of chronic disease and can be followed by her pcp.   # Chills & hot flashes- etiology unclear. 3 week history of. No other new symptoms. Possible pneumonia? Afebrile. No leukocytosis. Reviewed with Dr Donneta Romberg. He recommends chest xray to evaluate. I reviewed imaging and no evidence of infectious process. For now, I'd recommend monitoring her symptoms or follow up with  PCP. Risk of malignancy is low but if ongoing symptoms, development of cytopenias, or unintentional weight loss, could consider imaging. I've asked her to take her temperature during episodes to evaluate for possible recurrent fever.   # Morning nausea and colic like pain- hx of gallbladder pain. Requests referral to GI for further evaluation. Referral sent. Previously saw Dr Servando Snare in 2022.    # COPD/CHF- s/p Valve- bioprosthetoc [2008- on eliquis] / OSA- on CPAP- 5 liters.    Thank you Dr.Khan for allowing me to participate in the care of your patient. Please do not hesitate to contact me with questions or concerns in the interim.   DISPOSITION: Follow up as needed- la  No problem-specific Assessment & Plan notes found for this encounter.  All questions were answered. The patient knows to call the clinic with any problems, questions or concerns.   Alinda Dooms, NP 05/07/2023   CC: Dr. Beverely Risen

## 2023-05-11 ENCOUNTER — Other Ambulatory Visit: Payer: Self-pay | Admitting: Family

## 2023-05-11 ENCOUNTER — Other Ambulatory Visit: Payer: Self-pay | Admitting: Nurse Practitioner

## 2023-05-11 DIAGNOSIS — D649 Anemia, unspecified: Secondary | ICD-10-CM

## 2023-05-11 NOTE — Progress Notes (Signed)
Dr Donneta Romberg recommends reevaluation in 6 months. Schedule message sent to have patient scheduled. Labs entered. If worsening anemia, would recommend bone marrow biopsy.

## 2023-05-12 ENCOUNTER — Telehealth: Payer: Self-pay | Admitting: *Deleted

## 2023-05-12 ENCOUNTER — Telehealth: Payer: Self-pay | Admitting: Nurse Practitioner

## 2023-05-12 DIAGNOSIS — R232 Flushing: Secondary | ICD-10-CM

## 2023-05-12 NOTE — Telephone Encounter (Signed)
Patient informed of response and she asked what [name] thinks is gong on with her and would like to speak with [name]

## 2023-05-12 NOTE — Telephone Encounter (Signed)
Called patient. She continues to have hot flashes and cold chills throughout the day. Not worsening. I again reviewed bloodwork and imaging that was negative for infectious etiology. Cause of her sensory changes is unclear. I recommended she monitor her oral temperature and log readings. If worsening blood counts we could reevaluate her sooner but currently, hemoglobin is stable. No evidence of iron deficiency. If worsening counts may consider BMBx. She was referred back to GI for her morning nausea and colic like pain.

## 2023-05-12 NOTE — Telephone Encounter (Signed)
Patient called reporting that [name] was to have called her with her CXR results and that she has not heard form her. Please return her call

## 2023-05-13 DIAGNOSIS — I10 Essential (primary) hypertension: Secondary | ICD-10-CM | POA: Diagnosis not present

## 2023-05-13 DIAGNOSIS — I480 Paroxysmal atrial fibrillation: Secondary | ICD-10-CM | POA: Diagnosis not present

## 2023-05-13 DIAGNOSIS — Z953 Presence of xenogenic heart valve: Secondary | ICD-10-CM | POA: Diagnosis not present

## 2023-05-13 DIAGNOSIS — Z23 Encounter for immunization: Secondary | ICD-10-CM | POA: Diagnosis not present

## 2023-05-17 NOTE — Progress Notes (Signed)
PROVIDER NOTE: Information contained herein reflects review and annotations entered in association with encounter. Interpretation of such information and data should be left to medically-trained personnel. Information provided to patient can be located elsewhere in the medical record under "Patient Instructions". Document created using STT-dictation technology, any transcriptional errors that may result from process are unintentional.    Patient: [name]  Service Category: E/M  Provider: Oswaldo Done, MD  DOB: 1945/09/30  DOS: 05/19/2023  Referring Provider: Lyndon Code, MD  MRN: [id number]  Specialty: Interventional Pain Management  PCP: Lyndon Code, MD  Type: Established Patient  Setting: Ambulatory outpatient    Location: Office  Delivery: Face-to-face     HPI  [name], a 77 y.o. year old female, is here today because of her Chronic pain of both shoulders [M25.511, G89.29, M25.512]. 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; Shoulder impingement syndrome (Left); Chronic shoulder pain (Left); Chronic pain syndrome; Chronic shoulder pain (1ry area of Pain) (Bilateral) (L>R); Chronic upper extremity pain (2ry area of Pain) (Bilateral) (L>R); Cervicalgia; Chronic neck pain (3ry area of Pain) (Posterior) (Bilateral) (L>R); Shoulder blade pain (4th area of Pain) (Left); Osteoarthritis of glenohumeral joint (Left); Osteoarthritis of AC (acromioclavicular) joint (Left); Osteoarthritis of glenohumeral joints (Bilateral); Osteoarthritis of acromioclavicular joints (Bilateral); Primary osteoarthritis of shoulders (Bilateral); DDD (degenerative disc disease), cervical; Cervical radiculitis (Left); Cervical radiculopathy (Left); C6 radiculopathy (Left); C7 radiculopathy (Left); Unable to maintain body in lying position; Chronic intractable headache; DISH (diffuse idiopathic  skeletal hyperostosis); Abnormal CT scan, cervical spine (03/04/2022); Trigger point of shoulder region (Bilateral); Trigger point with neck pain; Chronic upper back pain; Chronic myofascial pain; and Musculoskeletal disorder involving upper trapezius muscle on their pertinent problem list. Pain Assessment: Severity of Chronic pain is reported as a 8 /10. Location: Shoulder Right, Left (left is worse)/radiates down arms bilat to hands. Onset: More than a month ago. Quality: Constant, Sharp, Throbbing. Timing: Constant. Modifying factor(s): meds. Vitals:  height is 4\' 10"  (1.473 m) and weight is 182 lb (82.6 kg). Her temperature is 98.6 F (37 C). Her blood pressure is 111/72 and her pulse is 71. Her respiration is 18 and oxygen saturation is 95%.  BMI: Estimated body mass index is 38.04 kg/m as calculated from the following:   Height as of this encounter: 4\' 10"  (1.473 m).   Weight as of this encounter: 182 lb (82.6 kg). Last encounter: 04/05/2023. Last procedure: 03/16/2023.  Reason for encounter: evaluation of worsening, or previously known (established) problem.  The patient indicates still having over back pain, bilaterally, around the area of the shoulder and shoulder blade.  In the past we have done some trigger points to help with some of the pain.  This pain seems to have a myofascial component to it but it is also coming from the cervical region.  Unfortunately the patient has severe COPD and orthopnea.  She is unable to tolerate laying down on her stomach to have a cervical epidural steroid injection.  This was already attempted in the past.  To make matters more complicated, the patient is on Eliquis anticoagulation.  She comes in today for evaluation and to set up an appointment for trigger point injections once she has stopped her Eliquis.  Pharmacotherapy Assessment  Analgesic: No chronic opioid analgesics therapy prescribed by our practice. Oxycodone/APAP 10-325, 1 tab p.o. BID by [name], FNP-C, working for  Dr. Lillia Carmel, MD. MME/day: 30 mg/day   Monitoring: [place] PMP: PDMP reviewed during this encounter.       Pharmacotherapy: No side-effects or adverse reactions reported. Compliance: No problems identified. Effectiveness: Clinically acceptable.  Nonah Mattes, RN  05/19/2023 12:53 PM  Sign when Signing Visit Safety precautions to be maintained throughout the outpatient stay will include: orient to surroundings, keep bed in low position, maintain call bell within reach at all times, provide assistance with transfer out of bed and ambulation.     No results found for: "CBDTHCR" No results found for: "D8THCCBX" No results found for: "D9THCCBX"  UDS:  Summary  Date Value Ref Range Status  04/30/2021 Note  Final    Comment:    ==================================================================== Compliance Drug Analysis, Ur ==================================================================== Test                             Result       Flag       Units  Drug Present and Declared for Prescription Verification   Alpha-hydroxyalprazolam        107          EXPECTED   ng/mg creat    Alpha-hydroxyalprazolam is an expected metabolite of alprazolam.    Source of alprazolam is a scheduled prescription medication.    Oxycodone                      1371         EXPECTED   ng/mg creat   Oxymorphone                    600          EXPECTED   ng/mg creat   Noroxycodone                   2311         EXPECTED   ng/mg creat    Sources of oxycodone include scheduled prescription medications.    Oxymorphone and noroxycodone are expected metabolites of oxycodone.    Oxymorphone is also available as a scheduled prescription medication.    Sertraline                     PRESENT      EXPECTED   Desmethylsertraline            PRESENT      EXPECTED    Desmethylsertraline is an expected metabolite of sertraline.    Trazodone                      PRESENT       EXPECTED   1,3 chlorophenyl piperazine    PRESENT      EXPECTED    1,3-chlorophenyl piperazine is an expected metabolite of trazodone.    Acetaminophen                  PRESENT      EXPECTED  Drug Absent but Declared for Prescription Verification   Diclofenac                     Not Detected UNEXPECTED    Diclofenac, as indicated in the declared medication list, is not    always detected even when used as directed.  ==================================================================== Test  Result    Flag   Units      Ref Range   Creatinine              45               mg/dL      >=40 ==================================================================== Declared Medications:  The flagging and interpretation on this report are based on the  following declared medications.  Unexpected results may arise from  inaccuracies in the declared medications.   **Note: The testing scope of this panel includes these medications:   Alprazolam (Xanax)  Oxycodone (Percocet)  Sertraline (Zoloft)  Trazodone (Desyrel)   **Note: The testing scope of this panel does not include small to  moderate amounts of these reported medications:   Acetaminophen (Tylenol)  Acetaminophen (Percocet)  Diclofenac (Voltaren)   **Note: The testing scope of this panel does not include the  following reported medications:   Albuterol (Ventolin HFA)  Apixaban (Eliquis)  Atorvastatin (Lipitor)  Bumetanide (Bumex)  Calcium  Flecainide (Tambocor)  Fluticasone (Trelegy)  Iron  Levothyroxine (Synthroid)  Lisinopril (Zestril)  Loratadine (Claritin)  Pantoprazole (Protonix)  Potassium (Klor-Con)  Ubiquinone (CoQ10)  Umeclidinium (Trelegy)  Vilanterol (Trelegy)  Vitamin D ==================================================================== For clinical consultation, please call [phone]. ====================================================================       ROS   Constitutional: Denies any fever or chills Gastrointestinal: No reported hemesis, hematochezia, vomiting, or acute GI distress Musculoskeletal: Denies any acute onset joint swelling, redness, loss of ROM, or weakness Neurological: No reported episodes of acute onset apraxia, aphasia, dysarthria, agnosia, amnesia, paralysis, loss of coordination, or loss of consciousness  Medication Review  ALPRAZolam, Budeson-Glycopyrrol-Formoterol, Calcium Carbonate-Vitamin D, CoQ10, albuterol, amLODipine, apixaban, atorvastatin, bumetanide, carvedilol, diclofenac Sodium, empagliflozin, ferrous sulfate, fluticasone, ipratropium-albuterol, levothyroxine, lisinopril, loratadine, oxyCODONE-acetaminophen, pantoprazole, potassium chloride, revefenacin, sertraline, sodium chloride, and traZODone  History Review  Allergy: [name] is allergic to benadryl [diphenhydramine hcl (sleep)], cetirizine, lasix [furosemide], levaquin [levofloxacin in d5w], meloxicam, and sulfa antibiotics. Drug: [name]  reports no history of drug use. Alcohol:  reports no history of alcohol use. Tobacco:  reports that she has quit smoking. Her smoking use included cigarettes. She has never used smokeless tobacco. Social: [name]  reports that she has quit smoking. Her smoking use included cigarettes. She has never used smokeless tobacco. She reports that she does not drink alcohol and does not use drugs. Medical:  has a past medical history of Aortic stenosis due to bicuspid aortic valve, Aspiration pneumonia ([name]), Atrial fibrillation with RVR ([name]) (01/11/2015), Atrial flutter ([name]), Basal skull fracture ([name]) (20 yrs ago), CHF (congestive heart failure) ([name]), Chronic respiratory failure ([name]), COPD (chronic obstructive pulmonary disease) ([name]), Deafness in left ear, Essential hypertension (01/24/2015), History of cardiac cath, History of stress test, HLD (hyperlipidemia), HTN (hypertension), Hypothyroidism, Obesity, PAF (paroxysmal atrial  fibrillation) ([name]), Paroxysmal atrial fibrillation ([name]) (01/24/2015), Right upper quadrant abdominal tenderness without rebound tenderness (03/02/2018), and S/P ascending aortic aneurysm repair (2008). Surgical: [name]  has a past surgical history that includes Abdominal aortic aneurysm repair (2008); Aortic valve replacement (2008); Abdominal hysterectomy; Carpal tunnel release; Tumor excision (Left); Cardiac catheterization; Cardiac catheterization (N/A, 08/17/2016); CARDIOVERSION (N/A, 08/15/2018); CARDIOVERSION (N/A, 11/14/2018); Cardioversion (N/A, 06/05/2019); Cardioversion (N/A, 08/14/2019); Cardioversion (N/A, 11/09/2019); and Cardioversion (N/A, 01/17/2020). Family: family history includes Stroke in her father and paternal grandmother.  Laboratory Chemistry Profile   Renal Lab Results  Component Value Date   BUN 15 04/27/2023   CREATININE 0.86 04/27/2023   BCR 17 04/27/2023   GFRAA >60  05/14/2020   GFRNONAA >60 03/06/2023    Hepatic Lab Results  Component Value Date   AST 15 04/27/2023   ALT 13 04/27/2023   ALBUMIN 4.0 04/27/2023   ALKPHOS 93 04/27/2023   LIPASE 30 07/10/2022    Electrolytes Lab Results  Component Value Date   NA 138 04/27/2023   K 3.8 04/27/2023   CL 98 04/27/2023   CALCIUM 9.2 04/27/2023   MG 2.4 02/03/2023   PHOS 3.5 01/30/2021    Bone Lab Results  Component Value Date   VD25OH 25.5 (L) 11/25/2020   25OHVITD1 31 04/30/2021   25OHVITD2 <1.0 04/30/2021   25OHVITD3 31 04/30/2021    Inflammation (CRP: Acute Phase) (ESR: Chronic Phase) Lab Results  Component Value Date   CRP 6 04/30/2021   ESRSEDRATE 40 04/30/2021   LATICACIDVEN 1.5 01/25/2022         Note: Above Lab results reviewed.  Recent Imaging Review  DG Chest 2 View CLINICAL DATA:  Evaluate for pneumonia  EXAM: CHEST - 2 VIEW  COMPARISON:  Chest x-ray 04/05/2023.  FINDINGS: Cardiomegaly and pulmonary vascular congestion. Left subclavian approach cardiac rhythm maintenance  device. Cardiac valve replacement. Mild streaky opacity at the lung bases, similar. No consolidation. No visible pleural effusions or pneumothorax.  IMPRESSION: No active cardiopulmonary disease.  Electronically Signed   By: Feliberto Harts M.D.   On: 05/07/2023 12:16 Note: Reviewed        Physical Exam  General appearance: Well nourished, well developed, and well hydrated. In no apparent acute distress Mental status: Alert, oriented x 3 (person, place, & time)       Respiratory: No evidence of acute respiratory distress Eyes: PERLA Vitals: BP 111/72   Pulse 71   Temp 98.6 F (37 C)   Resp 18   Ht 4\' 10"  (1.473 m)   Wt 182 lb (82.6 kg)   SpO2 95% Comment: 5L O2 from home  BMI 38.04 kg/m  BMI: Estimated body mass index is 38.04 kg/m as calculated from the following:   Height as of this encounter: 4\' 10"  (1.473 m).   Weight as of this encounter: 182 lb (82.6 kg). Ideal: Patient must be at least 60 in tall to calculate ideal body weight  Assessment   Diagnosis Status  1. Chronic shoulder pain (1ry area of Pain) (Bilateral) (L>R)   2. Chronic shoulder pain (Left)   3. Chronic upper extremity pain (2ry area of Pain) (Bilateral) (L>R)   4. DISH (diffuse idiopathic skeletal hyperostosis)   5. Musculoskeletal disorder involving upper trapezius muscle   6. Shoulder blade pain (4th area of Pain) (Left)   7. Chronic anticoagulation (Eliquis)   8. Orthopnea (Positional)    Controlled Controlled Controlled   Updated Problems: Problem  Aneurysm, Ascending Aorta ([name])   Overview:  Ascending Aortic Aneurysm Repair (#24 mm Dacron graft) ([facility]heat Procedure) (CPT [phone])  Transverse Aortic Arch Graft (Hemi-Arch) (#24 mm Dacron graft) with HCA/ACP Circulation Management (CPT [phone])  Overview:  Overview:  Ascending Aortic Aneurysm Repair (#24 mm Dacron graft) ([facility]heat Procedure) (CPT [phone])  Transverse Aortic Arch Graft (Hemi-Arch) (#24 mm Dacron graft) with HCA/ACP Circulation  Management (CPT [phone])    Formatting of this note might be different from the original. Ascending Aortic Aneurysm Repair (#24 mm Dacron graft) ([facility]heat Procedure) (CPT [id number])  Transverse Aortic Arch Graft (Hemi-Arch) (#24 mm Dacron graft) with HCA/ACP Circulation Management (CPT [phone]) Formatting of this note might be different from the original. Overview:  Ascending Aortic Aneurysm Repair (#24  mm Dacron graft) (Wheat Procedure) (CPT [phone])  Transverse Aortic Arch Graft (Hemi-Arch) (#24 mm Dacron graft) with HCA/ACP Circulation Management (CPT [phone])  Ascending Aortic Aneurysm Repair (#24 mm Dacron graft) ([facility]heat Procedure) (CPT [phone])  Transverse Aortic Arch Graft (Hemi-Arch) (#24 mm Dacron graft) with HCA/ACP Circulation Management (CPT [phone])      Plan of Care  Problem-specific:  No problem-specific Assessment & Plan notes found for this encounter.  [name] has a current medication list which includes the following long-term medication(s): atorvastatin, bumetanide, calcium carbonate-vitamin d, carvedilol, eliquis, ferrous sulfate, fluticasone, ipratropium-albuterol, levothyroxine, lisinopril, pantoprazole, sertraline, trazodone, and ventolin hfa.  Pharmacotherapy (Medications Ordered): No orders of the defined types were placed in this encounter.  Orders:  Orders Placed This Encounter  Procedures   TRIGGER POINT INJECTION    Standing Status:   Future    Standing Expiration Date:   08/18/2023    Scheduling Instructions:     Area: Upper Back     Side: Bilateral     Sedation: No Sedation.     Timeframe: ASAA    Order Specific Question:   Where will this procedure be performed?    Answer:   [facility]   Blood Thinner Instructions to Nursing    Always make sure patient has clearance from prescribing physician to stop blood thinners for interventional therapies. If the patient requires a Lovenox-bridge therapy, make sure arrangements are made to institute it with  the assistance of the PCP.    Scheduling Instructions:     Have [name] stop the Eliquis (Apixaban) x 3 days prior to procedure or surgery.   Follow-up plan:   Return for [facility]): (B) Trapezius TPI/MNB , (Blood Thinner Protocol).      Interventional Therapies  Risk  Complexity Considerations:   Hard of hearing (HOH)  COPD  O2 dependent  positional orthopnea  Eliquis Anticoagulation: (Stop: 3 days  Restart: 6 hours)  A-fib  prosthetic AoV (Prophylactic ampicillin 2g IV)  Cardiac pacemaker     Planned  Pending:   Palliative bilateral trapezius + supraspinatus m. MNB/TPI #4    Under consideration:   Palliative bilateral trapezius + supraspinatus m. MNB/TPI #4    Completed:   Diagnostic bilateral suprascapular NB x2 (09/25/2021) (100/100/100/100 x 2.5 months)  Therapeutic bilateral Trapezius and supraspinatus MNB x3 (03/16/2023) (100/100/100 x 27 days/80-90)    Therapeutic  Palliative (PRN) options:   Therapeutic upper back and shoulder (trapezius & supraspinatus) MNB/TPI    Pharmacotherapy  Poor candidate for any medication that may induce respiratory depression.       Recent Visits Date Type Provider Dept  05/19/23 Office Visit Delano Metz, MD [facility]  04/05/23 Office Visit Delano Metz, MD [facility]  03/16/23 Procedure visit Delano Metz, MD [facility]  03/08/23 Office Visit Delano Metz, MD [facility]  Showing recent visits within past 90 days and meeting all other requirements Future Appointments Date Type Provider Dept  06/01/23 Appointment Delano Metz, MD [facility]  Showing future appointments within next 90 days and meeting all other requirements  I discussed the assessment and treatment plan with the patient. The patient was provided an opportunity to ask questions and all were answered. The patient agreed with the plan and demonstrated an understanding of the  instructions.  Patient advised to call back or seek an in-person evaluation if the symptoms or condition worsens.  Duration of encounter: 30 minutes.  Total time on encounter, as per  AMA guidelines included both the face-to-face and non-face-to-face time personally spent by the physician and/or other qualified health care professional(s) on the day of the encounter (includes time in activities that require the physician or other qualified health care professional and does not include time in activities normally performed by clinical staff). Physician's time may include the following activities when performed: Preparing to see the patient (e.g., pre-charting review of records, searching for previously ordered imaging, lab work, and nerve conduction tests) Review of prior analgesic pharmacotherapies. Reviewing PMP Interpreting ordered tests (e.g., lab work, imaging, nerve conduction tests) Performing post-procedure evaluations, including interpretation of diagnostic procedures Obtaining and/or reviewing separately obtained history Performing a medically appropriate examination and/or evaluation Counseling and educating the patient/family/caregiver Ordering medications, tests, or procedures Referring and communicating with other health care professionals (when not separately reported) Documenting clinical information in the electronic or other health record Independently interpreting results (not separately reported) and communicating results to the patient/ family/caregiver Care coordination (not separately reported)  Note by: Oswaldo Done, MD Date: 05/19/2023; Time: 10:29 AM

## 2023-05-19 ENCOUNTER — Ambulatory Visit: Payer: Medicare Other | Attending: Pain Medicine | Admitting: Pain Medicine

## 2023-05-19 ENCOUNTER — Encounter: Payer: Self-pay | Admitting: Pain Medicine

## 2023-05-19 VITALS — BP 111/72 | HR 71 | Temp 98.6°F | Resp 18 | Ht <= 58 in | Wt 182.0 lb

## 2023-05-19 DIAGNOSIS — M79602 Pain in left arm: Secondary | ICD-10-CM | POA: Insufficient documentation

## 2023-05-19 DIAGNOSIS — M4810 Ankylosing hyperostosis [Forestier], site unspecified: Secondary | ICD-10-CM | POA: Diagnosis not present

## 2023-05-19 DIAGNOSIS — M79601 Pain in right arm: Secondary | ICD-10-CM | POA: Insufficient documentation

## 2023-05-19 DIAGNOSIS — Z7901 Long term (current) use of anticoagulants: Secondary | ICD-10-CM | POA: Insufficient documentation

## 2023-05-19 DIAGNOSIS — M25512 Pain in left shoulder: Secondary | ICD-10-CM | POA: Insufficient documentation

## 2023-05-19 DIAGNOSIS — M25511 Pain in right shoulder: Secondary | ICD-10-CM | POA: Insufficient documentation

## 2023-05-19 DIAGNOSIS — R0601 Orthopnea: Secondary | ICD-10-CM | POA: Insufficient documentation

## 2023-05-19 DIAGNOSIS — M898X1 Other specified disorders of bone, shoulder: Secondary | ICD-10-CM | POA: Diagnosis not present

## 2023-05-19 DIAGNOSIS — G8929 Other chronic pain: Secondary | ICD-10-CM | POA: Diagnosis not present

## 2023-05-19 DIAGNOSIS — M629 Disorder of muscle, unspecified: Secondary | ICD-10-CM | POA: Diagnosis not present

## 2023-05-19 NOTE — Progress Notes (Signed)
Safety precautions to be maintained throughout the outpatient stay will include: orient to surroundings, keep bed in low position, maintain call bell within reach at all times, provide assistance with transfer out of bed and ambulation.  

## 2023-05-19 NOTE — Patient Instructions (Signed)
Stop  Eliquis for 3 days prior to procedure per Dr Laban Emperor.  Do not eat or drink for 6 hours. You may drive yourself

## 2023-05-24 DIAGNOSIS — K8020 Calculus of gallbladder without cholecystitis without obstruction: Secondary | ICD-10-CM | POA: Diagnosis not present

## 2023-06-01 ENCOUNTER — Ambulatory Visit: Payer: Medicare Other | Attending: Pain Medicine | Admitting: Pain Medicine

## 2023-06-01 ENCOUNTER — Telehealth: Payer: Self-pay

## 2023-06-01 VITALS — BP 130/65 | HR 68 | Temp 97.5°F | Resp 20 | Ht <= 58 in | Wt 185.0 lb

## 2023-06-01 DIAGNOSIS — M629 Disorder of muscle, unspecified: Secondary | ICD-10-CM | POA: Insufficient documentation

## 2023-06-01 DIAGNOSIS — M25511 Pain in right shoulder: Secondary | ICD-10-CM | POA: Insufficient documentation

## 2023-06-01 DIAGNOSIS — G8929 Other chronic pain: Secondary | ICD-10-CM

## 2023-06-01 DIAGNOSIS — Z7901 Long term (current) use of anticoagulants: Secondary | ICD-10-CM | POA: Insufficient documentation

## 2023-06-01 DIAGNOSIS — M7918 Myalgia, other site: Secondary | ICD-10-CM | POA: Diagnosis not present

## 2023-06-01 DIAGNOSIS — M542 Cervicalgia: Secondary | ICD-10-CM | POA: Insufficient documentation

## 2023-06-01 DIAGNOSIS — M25519 Pain in unspecified shoulder: Secondary | ICD-10-CM | POA: Insufficient documentation

## 2023-06-01 DIAGNOSIS — M25512 Pain in left shoulder: Secondary | ICD-10-CM | POA: Insufficient documentation

## 2023-06-01 DIAGNOSIS — Z9981 Dependence on supplemental oxygen: Secondary | ICD-10-CM | POA: Diagnosis not present

## 2023-06-01 DIAGNOSIS — R0601 Orthopnea: Secondary | ICD-10-CM | POA: Insufficient documentation

## 2023-06-01 DIAGNOSIS — R6889 Other general symptoms and signs: Secondary | ICD-10-CM | POA: Diagnosis not present

## 2023-06-01 DIAGNOSIS — M549 Dorsalgia, unspecified: Secondary | ICD-10-CM | POA: Diagnosis not present

## 2023-06-01 DIAGNOSIS — J449 Chronic obstructive pulmonary disease, unspecified: Secondary | ICD-10-CM | POA: Insufficient documentation

## 2023-06-01 MED ORDER — TRIAMCINOLONE ACETONIDE 40 MG/ML IJ SUSP
40.0000 mg | Freq: Once | INTRAMUSCULAR | Status: AC
  Administered 2023-06-01: 40 mg
  Filled 2023-06-01: qty 1

## 2023-06-01 MED ORDER — ROPIVACAINE HCL 2 MG/ML IJ SOLN
9.0000 mL | Freq: Once | INTRAMUSCULAR | Status: AC
  Administered 2023-06-01: 9 mL
  Filled 2023-06-01: qty 20

## 2023-06-01 MED ORDER — LIDOCAINE HCL 2 % IJ SOLN
20.0000 mL | Freq: Once | INTRAMUSCULAR | Status: AC
  Administered 2023-06-01: 200 mg
  Filled 2023-06-01: qty 20

## 2023-06-01 MED ORDER — PENTAFLUOROPROP-TETRAFLUOROETH EX AERO
INHALATION_SPRAY | Freq: Once | CUTANEOUS | Status: DC
  Filled 2023-06-01: qty 30

## 2023-06-01 NOTE — Progress Notes (Signed)
PROVIDER NOTE: Interpretation of information contained herein should be left to medically-trained personnel. Specific patient instructions are provided elsewhere under "Patient Instructions" section of medical record. This document was created in part using STT-dictation technology, any transcriptional errors that may result from this process are unintentional.  Patient: [name] Type: Established DOB: September 22, 1945 MRN: [id number] PCP: Lyndon Code, MD  Service: Procedure DOS: 06/01/2023 Setting: Ambulatory Location: Ambulatory outpatient facility Delivery: Face-to-face Provider: Oswaldo Done, MD Specialty: Interventional Pain Management Specialty designation: 09 Location: Outpatient facility Ref. Prov.: Lyndon Code, MD       Interventional Therapy   Type: Trapezius and supraspinatus muscle Trigger Point Injection (Myoneural Block) (1-2 muscle groups)  #4 (w/ steroids)  CPT: 20552 Laterality: Bilateral (-50)   Imaging: N/A. Landmark-guided",           Anesthesia: Local anesthesia (1-2% Lidocaine) Anxiolysis: None                 Sedation: No Sedation                       DOS: 06/01/2023  Performed by: Oswaldo Done, MD  Medical Necessity (reasoning)  Purpose: Diagnostic/Therapeutic Rationale (medical necessity): procedure needed and proper for the diagnosis and/or treatment of [name]'s medical symptoms and needs. Indications: Upper back pain severe enough to impact quality of life and/or function. 1. Cervicalgia   2. Chronic upper back pain   3. Chronic myofascial pain   4. Chronic shoulder pain (1ry area of Pain) (Bilateral) (L>R)   5. Musculoskeletal disorder involving upper trapezius muscle   6. Trigger point of shoulder region, unspecified laterality   7. Trigger point with neck pain    Chronic anticoagulation (Eliquis)    COPD with hypoxia ([name])    Supplemental oxygen dependent    Oxygen dependent    Orthopnea (Positional)    Unable to maintain  body in lying position    NAS-11 Pain score:   Pre-procedure: 10-Worst pain ever/10   Post-procedure: 10-Worst pain ever/10       Muscle: Trapezius and supraspinatus muscle Target: Myoneural mass of trigger point. Region:    Upper back and shoulder region    Anatomic Surface:    Posterior    Anatomic Area:    Cervicothoracic    Level:    C7 spinous process     Approach: Percutaneous  Type of procedure: Myoneural injection   Position  Prep  Materials  Position: Sitting. Patient assisted into a comfortable position. Pressure points checked.  Prep solution: DuraPrep (Iodine Povacrylex [0.7% available iodine] and Isopropyl Alcohol, 74% w/w) The target area was identified and the area prepped in the usual manner.  Prep Area: Entire Upper Back Region  Materials:   Tray: Block Needle(s):  Type: Regular  Gauge (G): 22  Length: 1.5-in  Qty: 1  H&P (Pre-op Assessment):  [name] is a 77 y.o. (year old), female patient, seen today for interventional treatment. She  has a past surgical history that includes Abdominal aortic aneurysm repair (2008); Aortic valve replacement (2008); Abdominal hysterectomy; Carpal tunnel release; Tumor excision (Left); Cardiac catheterization; Cardiac catheterization (N/A, 08/17/2016); CARDIOVERSION (N/A, 08/15/2018); CARDIOVERSION (N/A, 11/14/2018); Cardioversion (N/A, 06/05/2019); Cardioversion (N/A, 08/14/2019); Cardioversion (N/A, 11/09/2019); and Cardioversion (N/A, 01/17/2020). [name] has a current medication list which includes the following prescription(s): alprazolam, amlodipine, atorvastatin, breztri aerosphere, bumetanide, calcium carbonate-vitamin d, carvedilol, coq10, diclofenac sodium, eliquis, ferrous sulfate, fluticasone, ipratropium-albuterol, jardiance, levothyroxine, lisinopril, loratadine, oxycodone-acetaminophen, pantoprazole, potassium chloride,  revefenacin, sertraline, sodium chloride, trazodone, and ventolin hfa, and the following  Facility-Administered Medications: pentafluoroprop-tetrafluoroeth. Her primarily concern today is the Shoulder Pain (bilateral)  Initial Vital Signs:  Pulse/HCG Rate: 68  Temp: (!) 97.5 F (36.4 C) Resp: 20 BP: 130/65 SpO2: 96 % (5 liters o2)  BMI: Estimated body mass index is 38.67 kg/m as calculated from the following:   Height as of this encounter: 4\' 10"  (1.473 m).   Weight as of this encounter: 185 lb (83.9 kg).  Risk Assessment: Allergies: Reviewed. She is allergic to benadryl [diphenhydramine hcl (sleep)], cetirizine, lasix [furosemide], levaquin [levofloxacin in d5w], meloxicam, and sulfa antibiotics.  Allergy Precautions: None required Coagulopathies: Reviewed. None identified.  Blood-thinner therapy: None at this time Active Infection(s): Reviewed. None identified. [name] is afebrile  Site Confirmation: Ms. Skowron was asked to confirm the procedure and laterality before marking the site Procedure checklist: Completed Consent: Before the procedure and under the influence of no sedative(s), amnesic(s), or anxiolytics, the patient was informed of the treatment options, risks and possible complications. To fulfill our ethical and legal obligations, as recommended by the American Medical Association's Code of Ethics, I have informed the patient of my clinical impression; the nature and purpose of the treatment or procedure; the risks, benefits, and possible complications of the intervention; the alternatives, including doing nothing; the risk(s) and benefit(s) of the alternative treatment(s) or procedure(s); and the risk(s) and benefit(s) of doing nothing. The patient was provided information about the general risks and possible complications associated with the procedure. These may include, but are not limited to: failure to achieve desired goals, infection, bleeding, organ or nerve damage, allergic reactions, paralysis, and death. In addition, the patient was informed of those  risks and complications associated to the procedure, such as failure to decrease pain; infection; bleeding; organ or nerve damage with subsequent damage to sensory, motor, and/or autonomic systems, resulting in permanent pain, numbness, and/or weakness of one or several areas of the body; allergic reactions; (i.e.: anaphylactic reaction); and/or death. Furthermore, the patient was informed of those risks and complications associated with the medications. These include, but are not limited to: allergic reactions (i.e.: anaphylactic or anaphylactoid reaction(s)); adrenal axis suppression; blood sugar elevation that in diabetics may result in ketoacidosis or comma; water retention that in patients with history of congestive heart failure may result in shortness of breath, pulmonary edema, and decompensation with resultant heart failure; weight gain; swelling or edema; medication-induced neural toxicity; particulate matter embolism and blood vessel occlusion with resultant organ, and/or nervous system infarction; and/or aseptic necrosis of one or more joints. Finally, the patient was informed that Medicine is not an exact science; therefore, there is also the possibility of unforeseen or unpredictable risks and/or possible complications that may result in a catastrophic outcome. The patient indicated having understood very clearly. We have given the patient no guarantees and we have made no promises. Enough time was given to the patient to ask questions, all of which were answered to the patient's satisfaction. Ms. Riofrio has indicated that she wanted to continue with the procedure. Attestation: I, the ordering provider, attest that I have discussed with the patient the benefits, risks, side-effects, alternatives, likelihood of achieving goals, and potential problems during recovery for the procedure that I have provided informed consent. Date  Time: 06/01/2023 11:40 AM   Pre-Procedure Preparation:  Monitoring: As  per clinic protocol. Respiration, ETCO2, SpO2, BP, heart rate and rhythm monitor placed and checked for adequate function Safety Precautions: Patient was  assessed for positional comfort and pressure points before starting the procedure. Time-out: I initiated and conducted the "Time-out" before starting the procedure, as per protocol. The patient was asked to participate by confirming the accuracy of the "Time Out" information. Verification of the correct person, site, and procedure were performed and confirmed by me, the nursing staff, and the patient. "Time-out" conducted as per Joint Commission's Universal Protocol (UP.01.01.01). Time: 1350 Start Time: 1350 hrs.   Narrative                Start Time: 1350 hrs.  Standard Safety Precautions: Protocol guidelines were followed. Aspiration was conducted prior to injection. At no point did I inject any substances, as a needle was being advanced. No attempts were made at seeking a paresthesia. Safe injection practices and needle disposal techniques used. Medications properly checked for expiration dates. SDV (single dose vial) medications used.  Local Anesthesia: Skin & deeper tissues infiltrated with local anesthetic. Appropriate amount of time allowed for local anesthetics to take effect.   Technical description:  The target area was identified and the area prepped in the usual manner. The procedure needles were then advanced to the target area. Proper needle placement secured. Negative aspiration confirmed. Solution injected in intermittent fashion, asking for systemic symptoms every 0.5cc of injectate. The needles were then removed and the area cleansed, making sure to leave some of the prepping solution back to take advantage of its long term bactericidal properties.  Vitals:   06/01/23 1139  BP: 130/65  Pulse: 68  Resp: 20  Temp: (!) 97.5 F (36.4 C)  SpO2: 96%  Weight: 185 lb (83.9 kg)  Height: 4\' 10"  (1.473 m)     End Time: 1355  hrs.  Imaging Guidance                Type of Imaging Technique: None used Indication(s): N/A Exposure Time: No patient exposure Contrast: None used. Fluoroscopic Guidance: N/A Ultrasound Guidance: N/A Interpretation: N/A   Post-operative Assessment:  Post-procedure Vital Signs:  Pulse/HCG Rate: 68  Temp: (!) 97.5 F (36.4 C) Resp: 20 BP: 130/65 SpO2: 96 % (5 liters o2)  EBL: None  Complications: No immediate post-treatment complications observed by team, or reported by patient.  Note: The patient tolerated the entire procedure well. A repeat set of vitals were taken after the procedure and the patient was kept under observation following institutional policy, for this type of procedure. Post-procedural neurological assessment was performed, showing return to baseline, prior to discharge. The patient was provided with post-procedure discharge instructions, including a section on how to identify potential problems. Should any problems arise concerning this procedure, the patient was given instructions to immediately contact us, at any time, without hesitation. In any case, we plan to contact the patient by telephone for a follow-up status report regarding this interventional procedure.  Comments:  No additional relevant information.   Plan of Care (POC)  Orders:  Orders Placed This Encounter  Procedures   TRIGGER POINT INJECTION    Scheduling Instructions:     Area: Upper Back     Side: Bilateral     Sedation: No Sedation.     Timeframe: Today    Order Specific Question:   Where will this procedure be performed?    Answer:   [facility]   Informed Consent Details: Physician/Practitioner Attestation; Transcribe to consent form and obtain patient signature    Provider Attestation: I, Laurinda Carreno A. Laban Emperor, MD, (Pain Management Specialist), the physician/practitioner, attest  that I have discussed with the patient the benefits, risks, side effects, alternatives,  likelihood of achieving goals and potential problems during recovery for the procedure that I have provided informed consent.    Scheduling Instructions:     Note: Always confirm laterality of pain with Ms. Dechellis, before procedure.     Transcribe to consent form and obtain patient signature.    Order Specific Question:   Physician/Practitioner attestation of informed consent for procedure/surgical case    Answer:   I, the physician/practitioner, attest that I have discussed with the patient the benefits, risks, side effects, alternatives, likelihood of achieving goals and potential problems during recovery for the procedure that I have provided informed consent.    Order Specific Question:   Procedure    Answer:   Myoneural Block (Trigger Point injection)    Order Specific Question:   Physician/Practitioner performing the procedure    Answer:   Jlyn Cerros A. Laban Emperor MD    Order Specific Question:   Indication/Reason    Answer:   Musculoskeletal pain/myofascial pain secondary to trigger point   Monitor O2 SATs    Have oxygen available and apply nasal canula at 2 L/min if SaO2 drops below 95%.    Standing Status:   Standing    Number of Occurrences:   1   Provide equipment / supplies at bedside    Procedure tray: "Block Tray" (Disposable  single use) Skin infiltration needle: Regular 1.5-in, 25-G, (x1) Block Needle type: Regular Amount/quantity: 1 Size: Short(1.5-inch) Gauge: (25G x1) + (22G x1)    Standing Status:   Standing    Number of Occurrences:   1    Order Specific Question:   Specify    Answer:   Block Tray   Care order/instruction: Please confirm that the patient has stopped the Eliquis (Apixaban) x 3 days prior to procedure or surgery.    Please confirm that the patient has stopped the Eliquis (Apixaban) x 3 days prior to procedure or surgery.    Standing Status:   Standing    Number of Occurrences:   1   Oxygen therapy Mode or (Route): Nasal cannula; FiO2: 21%; Liters Per  Minute: 2; Keep 02 saturation: Adjust flow to keep pulse oximetry level above 90% for COPD and above 92% for non-COPD patients.    Keep patient on oxygen by [place]. Adjust flow to keep pulse oximetry level above 90% for COPD and above 92% for non-COPD patients.    Standing Status:   Standing    Number of Occurrences:   1    Order Specific Question:   Mode or (Route)    Answer:   Nasal cannula    Order Specific Question:   FiO2    Answer:   21%    Order Specific Question:   Liters Per Minute    Answer:   2    Order Specific Question:   Keep 02 saturation    Answer:   Adjust flow to keep pulse oximetry level above 90% for COPD and above 92% for non-COPD patients.   Bleeding precautions    Standing Status:   Standing    Number of Occurrences:   1   Chronic Opioid Analgesic:  No chronic opioid analgesics therapy prescribed by our practice. Oxycodone/APAP 10-325, 1 tab p.o. BID by [name], FNP-C, working for Dr. Lillia Carmel, MD. MME/day: 30 mg/day   Medications ordered for procedure: Meds ordered this encounter  Medications   pentafluoroprop-tetrafluoroeth (GEBAUERS) aerosol  triamcinolone acetonide (KENALOG-40) injection 40 mg   ropivacaine (PF) 2 mg/mL (0.2%) (NAROPIN) injection 9 mL   lidocaine (XYLOCAINE) 2 % (with pres) injection 400 mg   Medications administered: We administered triamcinolone acetonide, ropivacaine (PF) 2 mg/mL (0.2%), and lidocaine.  See the medical record for exact dosing, route, and time of administration.  Follow-up plan:   Return in about 2 weeks (around 06/15/2023) for (Face2F), (PPE).       Interventional Therapies  Risk  Complexity Considerations:   Hard of hearing (HOH)  COPD  O2 dependent  positional orthopnea  Eliquis Anticoagulation: (Stop: 3 days  Restart: 6 hours)  A-fib  prosthetic AoV (Prophylactic ampicillin 2g IV)  Cardiac pacemaker     Planned  Pending:   Palliative bilateral trapezius + supraspinatus m.  MNB/TPI #4    Under consideration:   Palliative bilateral trapezius + supraspinatus m. MNB/TPI #4    Completed:   Diagnostic bilateral suprascapular NB x2 (09/25/2021) (100/100/100/100 x 2.5 months)  Therapeutic bilateral Trapezius and supraspinatus MNB x3 (03/16/2023) (100/100/100 x 27 days/80-90)    Therapeutic  Palliative (PRN) options:   Therapeutic upper back and shoulder (trapezius & supraspinatus) MNB/TPI    Pharmacotherapy  Poor candidate for any medication that may induce respiratory depression.        Recent Visits Date Type Provider Dept  05/19/23 Office Visit Delano Metz, MD [facility]  04/05/23 Office Visit Delano Metz, MD [facility]  03/16/23 Procedure visit Delano Metz, MD [facility]  03/08/23 Office Visit Delano Metz, MD [facility]  Showing recent visits within past 90 days and meeting all other requirements Today's Visits Date Type Provider Dept  06/01/23 Procedure visit Delano Metz, MD [facility]  Showing today's visits and meeting all other requirements Future Appointments Date Type Provider Dept  06/17/23 Appointment Delano Metz, MD [facility]  Showing future appointments within next 90 days and meeting all other requirements  Disposition: Discharge home  Discharge (Date  Time): 06/01/2023; 1357 hrs.   Primary Care Physician: Lyndon Code, MD Location: [facility] Note by: Oswaldo Done, MD (TTS technology used. I apologize for any typographical errors that were not detected and corrected.) Date: 06/01/2023; Time: 2:29 PM  Disclaimer:  Medicine is not an Visual merchandiser. The only guarantee in medicine is that nothing is guaranteed. It is important to note that the decision to proceed with this intervention was based on the information collected from the patient. The Data and conclusions were drawn from the  patient's questionnaire, the interview, and the physical examination. Because the information was provided in large part by the patient, it cannot be guaranteed that it has not been purposely or unconsciously manipulated. Every effort has been made to obtain as much relevant data as possible for this evaluation. It is important to note that the conclusions that lead to this procedure are derived in large part from the available data. Always take into account that the treatment will also be dependent on availability of resources and existing treatment guidelines, considered by other Pain Management Practitioners as being common knowledge and practice, at the time of the intervention. For Medico-Legal purposes, it is also important to point out that variation in procedural techniques and pharmacological choices are the acceptable norm. The indications, contraindications, technique, and results of the above procedure should only be interpreted and judged by a Board-Certified Interventional Pain Specialist with extensive familiarity and expertise in the same exact procedure and  technique.

## 2023-06-01 NOTE — Progress Notes (Signed)
Safety precautions to be maintained throughout the outpatient stay will include: orient to surroundings, keep bed in low position, maintain call bell within reach at all times, provide assistance with transfer out of bed and ambulation.  

## 2023-06-01 NOTE — Patient Instructions (Signed)
______________________________________________________________________    Post-Procedure Discharge Instructions  Instructions: Apply ice:  Purpose: This will minimize any swelling and discomfort after procedure.  When: Day of procedure, as soon as you get home. How: Fill a plastic sandwich bag with crushed ice. Cover it with a small towel and apply to injection site. How long: (15 min on, 15 min off) Apply for 15 minutes then remove x 15 minutes.  Repeat sequence on day of procedure, until you go to bed. Apply heat:  Purpose: To treat any soreness and discomfort from the procedure. When: Starting the next day after the procedure. How: Apply heat to procedure site starting the day following the procedure. How long: May continue to repeat daily, until discomfort goes away. Food intake: Start with clear liquids (like water) and advance to regular food, as tolerated.  Physical activities: Keep activities to a minimum for the first 8 hours after the procedure. After that, then as tolerated. Driving: If you have received any sedation, be responsible and do not drive. You are not allowed to drive for 24 hours after having sedation. Blood thinner: (Applies only to those taking blood thinners) You may restart your blood thinner 6 hours after your procedure. Insulin: (Applies only to Diabetic patients taking insulin) As soon as you can eat, you may resume your normal dosing schedule. Infection prevention: Keep procedure site clean and dry. Shower daily and clean area with soap and water. Post-procedure Pain Diary: Extremely important that this be done correctly and accurately. Recorded information will be used to determine the next step in treatment. For the purpose of accuracy, follow these rules: Evaluate only the area treated. Do not report or include pain from an untreated area. For the purpose of this evaluation, ignore all other areas of pain, except for the treated area. After your procedure,  avoid taking a long nap and attempting to complete the pain diary after you wake up. Instead, set your alarm clock to go off every hour, on the hour, for the initial 8 hours after the procedure. Document the duration of the numbing medicine, and the relief you are getting from it. Do not go to sleep and attempt to complete it later. It will not be accurate. If you received sedation, it is likely that you were given a medication that may cause amnesia. Because of this, completing the diary at a later time may cause the information to be inaccurate. This information is needed to plan your care. Follow-up appointment: Keep your post-procedure follow-up evaluation appointment after the procedure (usually 2 weeks for most procedures, 6 weeks for radiofrequencies). DO NOT FORGET to bring you pain diary with you.   Expect: (What should I expect to see with my procedure?) From numbing medicine (AKA: Local Anesthetics): Numbness or decrease in pain. You may also experience some weakness, which if present, could last for the duration of the local anesthetic. Onset: Full effect within 15 minutes of injected. Duration: It will depend on the type of local anesthetic used. On the average, 1 to 8 hours.  From steroids (Applies only if steroids were used): Decrease in swelling or inflammation. Once inflammation is improved, relief of the pain will follow. Onset of benefits: Depends on the amount of swelling present. The more swelling, the longer it will take for the benefits to be seen. In some cases, up to 10 days. Duration: Steroids will stay in the system x 2 weeks. Duration of benefits will depend on multiple posibilities including persistent irritating factors. Side-effects: If  present, they may typically last 2 weeks (the duration of the steroids). Frequent: Cramps (if they occur, drink Gatorade and take over-the-counter Magnesium 450-500 mg once to twice a day); water retention with temporary weight gain;  increases in blood sugar; decreased immune system response; increased appetite. Occasional: Facial flushing (red, warm cheeks); mood swings; menstrual changes. Uncommon: Long-term decrease or suppression of natural hormones; bone thinning. (These are more common with higher doses or more frequent use. This is why we prefer that our patients avoid having any injection therapies in other practices.)  Very Rare: Severe mood changes; psychosis; aseptic necrosis. From procedure: Some discomfort is to be expected once the numbing medicine wears off. This should be minimal if ice and heat are applied as instructed.  Call if: (When should I call?) You experience numbness and weakness that gets worse with time, as opposed to wearing off. New onset bowel or bladder incontinence. (Applies only to procedures done in the spine)  Emergency Numbers: Durning business hours (Monday - Thursday, 8:00 AM - 4:00 PM) (Friday, 9:00 AM - 12:00 Noon): [phone] After hours: [phone] NOTE: If you are having a problem and are unable connect with, or to talk to a provider, then go to your nearest urgent care or emergency department. If the problem is serious and urgent, please call 911. ______________________________________________________________________     ______________________________________________________________________    OTC Supplements: The following over-the-counter (OTC) supplements may be of some benefits when used in moderation in some chronic pain conditions. Note: Always consult with your primary care provider and/or pharmacist before taking any OTC medications to make sure there are no "drug-to-drug" interactions with the medications you currently take. Ask your physician which may be beneficial for your particular condition.  Supplement Possible benefit May be of benefit in treatment of   Turmeric/curcumin* anti-inflammatory Joint and muscle aches and pain associated with arthritis and  inflammation  Glucosamine/chondroitin (triple strength)* may slow loss of articular cartilage Osteoarthritis  Vitamin D-3* may suppress release of chemicals associated with inflammation Joint and muscle aches and pain associated with arthritis and inflammation  Moringa* anti-inflammatory with mild analgesic effects Joint and muscle aches and pain associated with arthritis and inflammation  Melatonin* Helps reset sleep cycle. Insomnia. May also be helpful in neurodegenerative disorders  Vitamin B-12* may help keep nerves and blood cells healthy as well as maintaining function of nervous system Neuropathies. Nerve pain (Burning pain)  Alpha-Lipoic-Acid (ALA)* antioxidant that may help with nerve health, pain, and blocking the activation of some inflammatory chemicals Diabetic neuropathy and metabolic syndrome       (*Always use manufacturer's recommended dosage.)  ______________________________________________________________________

## 2023-06-02 ENCOUNTER — Telehealth: Payer: Self-pay | Admitting: Pain Medicine

## 2023-06-02 ENCOUNTER — Telehealth: Payer: Self-pay | Admitting: *Deleted

## 2023-06-02 MED ORDER — OXYCODONE-ACETAMINOPHEN 10-325 MG PO TABS: 1.0000 | ORAL_TABLET | Freq: Two times a day (BID) | ORAL | 0 refills | Status: DC | PRN

## 2023-06-02 NOTE — Telephone Encounter (Signed)
Reports difficulty sleeping following TPI yesterday. I told her that sometimes steroids may cause difficulty sleeping.

## 2023-06-02 NOTE — Telephone Encounter (Signed)
Patient states she is having some problems since her procedure yesterday. Wants nurse to call her

## 2023-06-02 NOTE — Telephone Encounter (Signed)
Pt advised we sent med  

## 2023-06-02 NOTE — Telephone Encounter (Signed)
Attempted to call for post procedure follow-up. Message left. 

## 2023-06-03 DIAGNOSIS — J449 Chronic obstructive pulmonary disease, unspecified: Secondary | ICD-10-CM | POA: Diagnosis not present

## 2023-06-03 DIAGNOSIS — G4733 Obstructive sleep apnea (adult) (pediatric): Secondary | ICD-10-CM | POA: Diagnosis not present

## 2023-06-07 ENCOUNTER — Ambulatory Visit (INDEPENDENT_AMBULATORY_CARE_PROVIDER_SITE_OTHER): Payer: Medicare Other | Admitting: Dermatology

## 2023-06-07 VITALS — BP 102/59 | HR 71

## 2023-06-07 DIAGNOSIS — D485 Neoplasm of uncertain behavior of skin: Secondary | ICD-10-CM

## 2023-06-07 DIAGNOSIS — D492 Neoplasm of unspecified behavior of bone, soft tissue, and skin: Secondary | ICD-10-CM

## 2023-06-07 DIAGNOSIS — D1801 Hemangioma of skin and subcutaneous tissue: Secondary | ICD-10-CM | POA: Diagnosis not present

## 2023-06-07 DIAGNOSIS — W908XXA Exposure to other nonionizing radiation, initial encounter: Secondary | ICD-10-CM | POA: Diagnosis not present

## 2023-06-07 DIAGNOSIS — L578 Other skin changes due to chronic exposure to nonionizing radiation: Secondary | ICD-10-CM

## 2023-06-07 DIAGNOSIS — D1809 Hemangioma of other sites: Secondary | ICD-10-CM | POA: Diagnosis not present

## 2023-06-07 NOTE — Patient Instructions (Addendum)

## 2023-06-07 NOTE — Progress Notes (Signed)
   New Patient Visit   Subjective  [name] is a 77 y.o. female who presents for the following: Bump on the left medial canthus that she is concerned about, present for 2 1/2-3 years. She would like removed, if possible.   The following portions of the chart were reviewed this encounter and updated as appropriate: medications, allergies, medical history  Review of Systems:  No other skin or systemic complaints except as noted in HPI or Assessment and Plan.  Objective  Well appearing patient in no apparent distress; mood and affect are within normal limits.  A focused examination was performed of the following areas: face Relevant physical exam findings are noted in the Assessment and Plan.  Left lower eyelid- medial canthus 4.0 mm firm blue papule       Assessment & Plan   Neoplasm of uncertain behavior of skin Left lower eyelid- medial canthus  Skin / nail biopsy Type of biopsy: punch   Informed consent: discussed and consent obtained   Patient was prepped and draped in usual sterile fashion: Area prepped with alcohol. Anesthesia: the lesion was anesthetized in a standard fashion   Anesthetic:  1% lidocaine w/ epinephrine 1-100,000 buffered w/ 8.4% NaHCO3 Punch size:  4 mm Suture removal (days):  7 Hemostasis achieved with: pressure and electrodesiccation   Outcome: patient tolerated procedure well   Post-procedure details: wound care instructions given   Post-procedure details comment:  Ointment and pressure dressing applied  Specimen 1 - Surgical pathology Differential Diagnosis: Hemangioma vs Blue Nevus vs Cyst Check Margins: No 4.0 mm punch   ACTINIC DAMAGE - chronic, secondary to cumulative UV radiation exposure/sun exposure over time - diffuse scaly erythematous macules with underlying dyspigmentation - Recommend daily broad spectrum sunscreen SPF 30+ to sun-exposed areas, reapply every 2 hours as needed.  - Recommend staying in the shade or wearing  long sleeves, sun glasses (UVA+UVB protection) and wide brim hats (4-inch brim around the entire circumference of the hat). - Call for new or changing lesions.   Return if symptoms worsen or fail to improve.  ICherlyn Labella, CMA, am acting as scribe for Willeen Niece, MD .   Documentation: I have reviewed the above documentation for accuracy and completeness, and I agree with the above.  Willeen Niece, MD

## 2023-06-08 ENCOUNTER — Inpatient Hospital Stay
Admission: RE | Admit: 2023-06-08 | Discharge: 2023-06-08 | Disposition: A | Discharge status: Home / Self Care | Payer: Self-pay | Source: Ambulatory Visit | Attending: Internal Medicine | Admitting: Internal Medicine

## 2023-06-08 ENCOUNTER — Encounter: Payer: Self-pay | Admitting: Internal Medicine

## 2023-06-08 ENCOUNTER — Ambulatory Visit (INDEPENDENT_AMBULATORY_CARE_PROVIDER_SITE_OTHER): Payer: Medicare Other | Admitting: Internal Medicine

## 2023-06-08 ENCOUNTER — Other Ambulatory Visit: Payer: Self-pay | Admitting: *Deleted

## 2023-06-08 VITALS — BP 120/70 | HR 77 | Temp 98.3°F | Resp 16 | Ht <= 58 in | Wt 187.0 lb

## 2023-06-08 DIAGNOSIS — R2232 Localized swelling, mass and lump, left upper limb: Secondary | ICD-10-CM

## 2023-06-08 DIAGNOSIS — C3411 Malignant neoplasm of upper lobe, right bronchus or lung: Secondary | ICD-10-CM

## 2023-06-08 DIAGNOSIS — R232 Flushing: Secondary | ICD-10-CM

## 2023-06-08 DIAGNOSIS — Z1231 Encounter for screening mammogram for malignant neoplasm of breast: Secondary | ICD-10-CM

## 2023-06-08 NOTE — Progress Notes (Signed)
[facility] [address] [place], [place] [address]  Internal MEDICINE  Office Visit Note  Patient Name: [name]  [id number]  [id number]  Date of Service: 06/23/2023  Chief Complaint  Patient presents with   Acute Visit    Lump under left arm    HPI Pt is seen for acute visit Noticed a lump in left armpit, denies any pain  Blood pressure s under good control  Seen by radiation oncology  C/O sweating excessively at times, thinks it might be after her radiation, as she has decided not to get systemic treatment     Current Medication: Outpatient Encounter Medications as of 06/08/2023  Medication Sig   ALPRAZolam (XANAX) 0.25 MG tablet Take 1 tablet (0.25 mg total) by mouth 2 (two) times daily as needed for anxiety.   atorvastatin (LIPITOR) 40 MG tablet TAKE 1 TABLET BY MOUTH EVERY NIGHT AT BEDTIME   BREZTRI AEROSPHERE 160-9-4.8 MCG/ACT AERO INHALE 2 PUFFS INTO THE LUNGS TWICE DAILY   bumetanide (BUMEX) 1 MG tablet Take 2 tablets (2 mg total) by mouth 2 (two) times daily. And additional 2 tablets PM PRN   Calcium Carbonate-Vitamin D (CALCIUM 600+D PO) Take 1 tablet by mouth daily.   carvedilol (COREG) 25 MG tablet Take 25 mg by mouth 2 (two) times daily with a meal.   Coenzyme Q10 (COQ10) 200 MG CAPS Take 200 mg by mouth daily.   diclofenac Sodium (VOLTAREN) 1 % GEL Apply 2 g topically 4 (four) times daily.   ELIQUIS 5 MG TABS tablet TAKE 1 TABLET BY MOUTH TWICE DAILY   JARDIANCE 10 MG TABS tablet TAKE 1 TABLET(10 MG) BY MOUTH DAILY BEFORE BREAKFAST   levothyroxine (SYNTHROID) 25 MCG tablet TAKE 1 TABLET BY MOUTH DAILY BEFORE BREAKFAST   lisinopril (ZESTRIL) 40 MG tablet TAKE 1 TABLET(40 MG) BY MOUTH DAILY (Patient taking differently: Take 40 mg by mouth daily.)   loratadine (CLARITIN) 10 MG tablet Take 10 mg by mouth daily.   oxyCODONE-acetaminophen (PERCOCET) 10-325 MG tablet Take 1 tablet by mouth 2 (two) times daily as needed for pain.   pantoprazole  (PROTONIX) 40 MG tablet TAKE 1 TABLET BY MOUTH EVERY DAY   potassium chloride (KLOR-CON) 10 MEQ tablet Take 10 mEq by mouth 2 (two) times daily.   sertraline (ZOLOFT) 100 MG tablet TAKE 1 TABLET BY MOUTH ONCE A DAY FOR ANXIETY (Patient taking differently: Take 100 mg by mouth daily. TAKE 1 TABLET BY MOUTH ONCE A DAY FOR ANXIETY)   sodium chloride (OCEAN) 0.65 % SOLN nasal spray Place 1 spray into both nostrils as needed for congestion.   traZODone (DESYREL) 50 MG tablet TAKE 1 TABLET BY MOUTH EVERY DAY AT BEDTIME   VENTOLIN HFA 108 (90 Base) MCG/ACT inhaler INHALE 2 PUFFS INTO THE LUNGS EVERY 6 HOURS AS NEEDED FOR WHEEZING OR SHORTNESS OF BREATH   No facility-administered encounter medications on file as of 06/08/2023.    Surgical History: Past Surgical History:  Procedure Laterality Date   ABDOMINAL AORTIC ANEURYSM REPAIR  2008   ABDOMINAL HYSTERECTOMY     AORTIC VALVE REPLACEMENT  2008   CARDIAC CATHETERIZATION     [facility]   CARDIOVERSION N/A 08/15/2018   Procedure: CARDIOVERSION;  Surgeon: Iran Ouch, MD;  Location: [facility] ORS;  Service: Cardiovascular;  Laterality: N/A;   CARDIOVERSION N/A 11/14/2018   Procedure: CARDIOVERSION (CATH LAB);  Surgeon: Iran Ouch, MD;  Location: [facility] ORS;  Service: Cardiovascular;  Laterality: N/A;   CARDIOVERSION N/A  06/05/2019   Procedure: CARDIOVERSION;  Surgeon: Iran Ouch, MD;  Location: [facility] ORS;  Service: Cardiovascular;  Laterality: N/A;   CARDIOVERSION N/A 08/14/2019   Procedure: CARDIOVERSION;  Surgeon: Iran Ouch, MD;  Location: [facility] ORS;  Service: Cardiovascular;  Laterality: N/A;   CARDIOVERSION N/A 11/09/2019   Procedure: CARDIOVERSION;  Surgeon: Marcina Millard, MD;  Location: [facility] ORS;  Service: Cardiovascular;  Laterality: N/A;   CARDIOVERSION N/A 01/17/2020   Procedure: CARDIOVERSION;  Surgeon: Marcina Millard, MD;  Location: [facility] ORS;  Service: Cardiovascular;  Laterality: N/A;   CARPAL TUNNEL RELEASE      ELECTROPHYSIOLOGIC STUDY N/A 08/17/2016   Procedure: Cardioversion;  Surgeon: Iran Ouch, MD;  Location: [facility] ORS;  Service: Cardiovascular;  Laterality: N/A;   TUMOR EXCISION Left    x3 (arm)    Medical History: Past Medical History:  Diagnosis Date   Aortic 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 s/p bioprosthetic valve replacement 2008 at [facility];  b. 01/2015 Echo: EF 60-65%, no rwma, Gr1 DD, mildly dil LA, nl RV fxn.   Aspiration pneumonia ([name])    Atrial fibrillation with RVR ([name]) 01/11/2015   Atrial flutter ([name])    a. 08/2016 s/p DCCV.  Remains on flecainide 50 mg bid.   Basal skull fracture ([name]) 20 yrs ago   CHF (congestive heart failure) ([name])    Chronic respiratory failure ([name])    COPD (chronic obstructive pulmonary disease) ([name])    a. 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 2008 prior to Aortic aneurysm repair-->nl cors.   History of stress test    a. 10/2015 MV: no ischemia/infarct.   HLD (hyperlipidemia)    HTN (hypertension)    Hypothyroidism    Obesity    PAF (paroxysmal atrial fibrillation) ([name])    a. on Eliquis; b. CHADS2VASc = 3 (HTN, age x 1, female).   Paroxysmal atrial fibrillation ([name]) 01/24/2015   Right upper quadrant abdominal tenderness without rebound tenderness 03/02/2018   S/P ascending aortic aneurysm repair 2008    Family History: Family History  Problem Relation Age of Onset   Stroke Father    Stroke Paternal Grandmother    Breast cancer Neg Hx     Social History   Socioeconomic History   Marital status: Divorced    Spouse name: Not on file   Number of children: Not on file   Years of education: Not on file   Highest education level: Not on file  Occupational History   Not on file  Tobacco Use   Smoking status: Former    Types: Cigarettes   Smokeless tobacco: Never  Vaping Use   Vaping status: Never  Used  Substance and Sexual Activity   Alcohol use: No   Drug use: No   Sexual activity: Never    Birth control/protection: Surgical  Other Topics Concern   Not on file  Social History Narrative   Not on file   Social Determinants of Health   Financial Resource Strain: Low Risk  (11/03/2022)   Received [facility]   Overall Financial Resource Strain (CARDIA)    Difficulty of Paying Living Expenses: Not hard at all  Recent Concern: Financial Resource Strain - Medium Risk (11/02/2022)   Received from [facility]  Overall Financial Resource Strain (CARDIA)    Difficulty of Paying Living Expenses: Somewhat hard  Food Insecurity: Food Insecurity Present (04/02/2023)   Hunger Vital Sign    Worried About Running Out of Food in the Last Year: Sometimes true    Ran Out of Food in the Last Year: Sometimes true  Transportation Needs: No Transportation Needs (02/02/2023)   PRAPARE - Administrator, Civil Service (Medical): No    Lack of Transportation (Non-Medical): No  Physical Activity: Not on file  Stress: Not on file  Social Connections: Not on file  Intimate Partner Violence: Not At Risk (04/02/2023)   Humiliation, Afraid, Rape, and Kick questionnaire    Fear of Current or Ex-Partner: No    Emotionally Abused: No    Physically Abused: No    Sexually Abused: No      Review of Systems  Constitutional:  Negative for fatigue and fever.  HENT:  Negative for congestion, mouth sores and postnasal drip.   Respiratory:  Negative for cough.   Cardiovascular:  Negative for chest pain.  Genitourinary:  Negative for flank pain.  Psychiatric/Behavioral: Negative.      Vital Signs: BP 120/70   Pulse 77   Temp 98.3 F (36.8 C)   Resp 16   Ht 4\' 10"  (1.473 m)   Wt 187 lb (84.8 kg)   SpO2 93%   BMI 39.08 kg/m    Physical Exam Constitutional:      Appearance: Normal appearance.  HENT:     Head:  Normocephalic and atraumatic.     Nose: Nose normal.     Mouth/Throat:     Mouth: Mucous membranes are moist.     Pharynx: No posterior oropharyngeal erythema.  Eyes:     Extraocular Movements: Extraocular movements intact.     Pupils: Pupils are equal, round, and reactive to light.  Cardiovascular:     Pulses: Normal pulses.     Heart sounds: Normal heart sounds.  Pulmonary:     Effort: Pulmonary effort is normal.     Breath sounds: Normal breath sounds.  Chest:  Breasts:    Right: Normal.     Left: Normal.  Lymphadenopathy:     Upper Body:     Left upper body: Axillary adenopathy present.  Neurological:     General: No focal deficit present.     Mental Status: She is alert.  Psychiatric:        Mood and Affect: Mood normal.        Behavior: Behavior normal.        Assessment/Plan: 1. Vasomotor flushing Will follow with CBC, TSH, T4  2. Visit for screening mammogram - MM 3D DIAGNOSTIC MAMMOGRAM BILATERAL BREAST; Future  3. Mass of left axilla Will follow up with U/S  4. Malignant neoplasm of right upper lobe of lung ([name]) Followed by oncology    General Counseling: Keena verbalizes understanding of the findings of todays visit and agrees with plan of treatment. I have discussed any further diagnostic evaluation that may be needed or ordered today. We also reviewed her medications today. she has been encouraged to call the office with any questions or concerns that should arise related to todays visit.    Orders Placed This Encounter  Procedures   MM 3D DIAGNOSTIC MAMMOGRAM BILATERAL BREAST    No orders of the defined types were placed in this encounter.   Total time spent:35 Minutes Time spent includes review of chart, medications, test results, and follow  up plan with the patient.    Controlled Substance Database was reviewed by me.   Dr Lyndon Code Internal medicine

## 2023-06-10 LAB — SURGICAL PATHOLOGY

## 2023-06-14 ENCOUNTER — Telehealth: Payer: Self-pay

## 2023-06-14 NOTE — Telephone Encounter (Signed)
-----   Message from Willeen Niece sent at 06/14/2023 11:23 AM EDT ----- 1. Skin, left inferior medial canthus :       ARTERIOVENOUS HEMANGIOMA    Benign "blood mole" - please call patient

## 2023-06-14 NOTE — Telephone Encounter (Signed)
Advised pt of bx results/sh ?

## 2023-06-15 DIAGNOSIS — H524 Presbyopia: Secondary | ICD-10-CM | POA: Diagnosis not present

## 2023-06-15 DIAGNOSIS — H2513 Age-related nuclear cataract, bilateral: Secondary | ICD-10-CM | POA: Diagnosis not present

## 2023-06-15 DIAGNOSIS — Z0100 Encounter for examination of eyes and vision without abnormal findings: Secondary | ICD-10-CM | POA: Diagnosis not present

## 2023-06-17 ENCOUNTER — Encounter: Payer: Self-pay | Admitting: Pain Medicine

## 2023-06-17 ENCOUNTER — Ambulatory Visit: Payer: Medicare Other | Attending: Pain Medicine | Admitting: Pain Medicine

## 2023-06-17 VITALS — BP 104/56 | HR 75 | Temp 97.2°F | Resp 20 | Ht <= 58 in | Wt 183.0 lb

## 2023-06-17 DIAGNOSIS — M25512 Pain in left shoulder: Secondary | ICD-10-CM | POA: Insufficient documentation

## 2023-06-17 DIAGNOSIS — M629 Disorder of muscle, unspecified: Secondary | ICD-10-CM | POA: Insufficient documentation

## 2023-06-17 DIAGNOSIS — G8929 Other chronic pain: Secondary | ICD-10-CM | POA: Insufficient documentation

## 2023-06-17 DIAGNOSIS — M25519 Pain in unspecified shoulder: Secondary | ICD-10-CM | POA: Diagnosis not present

## 2023-06-17 DIAGNOSIS — M542 Cervicalgia: Secondary | ICD-10-CM | POA: Insufficient documentation

## 2023-06-17 DIAGNOSIS — Z09 Encounter for follow-up examination after completed treatment for conditions other than malignant neoplasm: Secondary | ICD-10-CM | POA: Insufficient documentation

## 2023-06-17 DIAGNOSIS — M549 Dorsalgia, unspecified: Secondary | ICD-10-CM | POA: Diagnosis not present

## 2023-06-17 DIAGNOSIS — M25511 Pain in right shoulder: Secondary | ICD-10-CM | POA: Diagnosis not present

## 2023-06-17 DIAGNOSIS — M7918 Myalgia, other site: Secondary | ICD-10-CM | POA: Insufficient documentation

## 2023-06-17 NOTE — Patient Instructions (Addendum)
Pain Management Discharge Instructions  General Discharge Instructions :  If you need to reach your doctor call: Monday-Friday 8:00 am - 4:00 pm at [phone] or toll free [phone].  After clinic hours [phone] to have operator reach doctor.  Bring all of your medication bottles to all your appointments in the pain clinic.  To cancel or reschedule your appointment with Pain Management please remember to call 24 hours in advance to avoid a fee.  Refer to the educational materials which you have been given on: General Risks, I had my Procedure. Discharge Instructions, Post Sedation.  Post Procedure Instructions:  The drugs you were given will stay in your system until tomorrow, so for the next 24 hours you should not drive, make any legal decisions or drink any alcoholic beverages.  You may eat anything you prefer, but it is better to start with liquids then soups and crackers, and gradually work up to solid foods.  Please notify your doctor immediately if you have any unusual bleeding, trouble breathing or pain that is not related to your normal pain.  Depending on the type of procedure that was done, some parts of your body may feel week and/or numb.  This usually clears up by tonight or the next day.  Walk with the use of an assistive device or accompanied by an adult for the 24 hours.  You may use ice on the affected area for the first 24 hours.  Put ice in a Ziploc bag and cover with a towel and place against area 15 minutes on 15 minutes off.  You may switch to heat after 24 hours.______________________________________________________________________    OTC Supplements:   The following is a list of over-the-counter (OTC) supplements that have been found to have [employer]) studies suggesting that they may be of some benefits when used in moderation in some chronic pain-related conditions.  NOTE:  Always consult with your primary care provider  and/or pharmacist before taking any OTC medications to make sure they will not interact with your current medications. Always use manufacturer's recommended dosage.  Supplement Possible benefit May be of benefit in treatment of  Active ingredient(s)  Turmeric/curcumin anti-inflammatory Joint and muscle aches and pain associated with arthritis and inflammation The main active ingredient in turmeric is curcumin.  Glucosamine/chondroitin (triple strength) may slow loss of articular cartilage Osteoarthritis glucosamine HCl and chondroitin sulfate  Vitamin D-3 may suppress release of chemicals associated with inflammation Joint and muscle aches and pain associated with arthritis and inflammation CHOLECALCIFEROL; ALPHA.-TOCOPHEROL, D  Moringa anti-inflammatory with mild analgesic effects Joint and muscle aches and pain associated with arthritis and inflammation Many:  Phenolic acids: Gallic acid, ellagic acid, ferulic acid, caffeic acid, o-coumaric acid, and chlorogenic acid. Flavonoids: Rutin, quercetin, rhamnetin, kaempferol, apigenin, and myricetin. Terpenes: Lutein, all-E-luteoxanthin, 13-Z-lutein, 15-Z-?-carotene, and all-E-zeaxanthin. Alkaloids: Marumoside A, marumoside B, and pyrrolemarumine-4?-O-?-L-rhamnopyranoside. Vitamins: Vitamin A and vitamin C. Protein: Milk protein  Melatonin Helps reset sleep cycle. Insomnia. May also be helpful in neurodegenerative disorders melatonin, also known as N-acetyl-5-methoxytryptamine  Vitamin B-12 may help keep nerves and blood cells healthy as well as maintaining function of nervous system Neuropathies. Nerve pain (Burning pain) methylcobalamin and 5-deoxyadenosylcobalamin  Alpha-Lipoic-Acid (ALA) antioxidant that may help with nerve health, pain, and blocking the activation of some inflammatory chemicals Diabetic neuropathy and metabolic syndrome Alpha-lipoic acid (LA), or 1,2-dithiolane-3-pentanoic acid  superoxide dismutase (SOD) Currently being reviewed.   Superoxide dismutase (SOD); Copper-zinc superoxide dismutase  Tiger Balm Currently being reviewed.  Camphor;  Menthol; Capsicum Extract (Capsicin); Methyl Salicylate; Essential Oils (Cassia Oil, Cajuput Oil, Clove Oil, and Dementholized Mint Oil)    ______________________________________________________________________

## 2023-06-17 NOTE — Progress Notes (Signed)
PROVIDER NOTE: Information contained herein reflects review and annotations entered in association with encounter. Interpretation of such information and data should be left to medically-trained personnel. Information provided to patient can be located elsewhere in the medical record under "Patient Instructions". Document created using STT-dictation technology, any transcriptional errors that may result from process are unintentional.    Patient: [name]  Service Category: E/M  Provider: Oswaldo Done, MD  DOB: 1945/11/05  DOS: 06/17/2023  Referring Provider: Lyndon Code, MD  MRN: [id number]  Specialty: Interventional Pain Management  PCP: Lyndon Code, MD  Type: Established Patient  Setting: Ambulatory outpatient    Location: Office  Delivery: Face-to-face     HPI  [name], a 77 y.o. year old female, is here today because of her Cervicalgia [M54.2]. 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; Shoulder impingement syndrome (Left); Chronic shoulder pain (Left); Chronic pain syndrome; Chronic shoulder pain (1ry area of Pain) (Bilateral) (L>R); Chronic upper extremity pain (2ry area of Pain) (Bilateral) (L>R); Cervicalgia; Chronic neck pain (3ry area of Pain) (Posterior) (Bilateral) (L>R); Shoulder blade pain (4th area of Pain) (Left); Osteoarthritis of glenohumeral joint (Left); Osteoarthritis of AC (acromioclavicular) joint (Left); Osteoarthritis of glenohumeral joints (Bilateral); Osteoarthritis of acromioclavicular joints (Bilateral); Primary osteoarthritis of shoulders (Bilateral); DDD (degenerative disc disease), cervical; Cervical radiculitis (Left); Cervical radiculopathy (Left); C6 radiculopathy (Left); C7 radiculopathy (Left); Unable to tolerate prone position due to orthopnea; Chronic intractable headache; DISH (diffuse idiopathic skeletal hyperostosis);  Abnormal CT scan, cervical spine (03/04/2022); Trigger point of shoulder region (Bilateral); Trigger point with neck pain; Chronic upper back pain; Chronic myofascial pain; and Musculoskeletal disorder involving upper trapezius muscle on their pertinent problem list. Pain Assessment: Severity of Chronic pain is reported as a 5 /10. Location: Shoulder Right, Left/down arms to wrist, effects hands. Onset: More than a month ago. Quality: Aching, Throbbing, Tender, Discomfort, Shooting, Radiating. Timing: Constant. Modifying factor(s): meds. resting arms. Vitals:  height is 4\' 10"  (1.473 m) and weight is 183 lb (83 kg). Her temperature is 97.2 F (36.2 C) (abnormal). Her blood pressure is 104/56 (abnormal) and her pulse is 75. Her respiration is 20 and oxygen saturation is 92%.  BMI: Estimated body mass index is 38.25 kg/m as calculated from the following:   Height as of this encounter: 4\' 10"  (1.473 m).   Weight as of this encounter: 183 lb (83 kg). Last encounter: 05/19/2023. Last procedure: 06/01/2023.  Reason for encounter: post-procedure evaluation and assessment.  Today [name] forgot her hearing aids and her postprocedure pain diary.  She indicates that the trigger point injections only last for couple days specially because she has to do her own cooking, driving, and doing all of these activities tend to aggravate her shoulder pain.  I have also explained to the patient that part of the problem is that the etiology of her pain is that of the cervical spine, but we are unable to do any of the procedures that we need to do due to the fact that she has severe orthopnea and is unable to lay down on her stomach to have these procedures done.  She agrees with this.  For this reason, we will continue to treat her with intermittent trigger point injections.  Today I have also provided her with written information regarding over-the-counter supplements that may be of some benefit to her.  We will continue to  see her on a PRN basis.  Post-procedure evaluation   Type: Trapezius and supraspinatus muscle Trigger Point Injection (Myoneural Block) (1-2 muscle groups)  #4 (w/ steroids)  CPT: 20552 Laterality: Bilateral (-50)   Imaging: N/A. Landmark-guided",           Anesthesia: Local anesthesia (1-2% Lidocaine) Anxiolysis: None                 Sedation: No Sedation                       DOS: 06/01/2023  Performed by: Oswaldo Done, MD  Medical Necessity (reasoning)  Purpose: Diagnostic/Therapeutic Rationale (medical necessity): procedure needed and proper for the diagnosis and/or treatment of [name] medical symptoms and needs. Indications: Upper back pain severe enough to impact quality of life and/or function. 1. Cervicalgia   2. Chronic upper back pain   3. Chronic myofascial pain   4. Chronic shoulder pain (1ry area of Pain) (Bilateral) (L>R)   5. Musculoskeletal disorder involving upper trapezius muscle   6. Trigger point of shoulder region, unspecified laterality   7. Trigger point with neck pain    Chronic anticoagulation (Eliquis)    COPD with hypoxia ([name])    Supplemental oxygen dependent    Oxygen dependent    Orthopnea (Positional)    Unable to maintain body in lying position    NAS-11 Pain score:   Pre-procedure: 10-Worst pain ever/10   Post-procedure: 10-Worst pain ever/10       Effectiveness:  Initial hour after procedure: 100 %. Subsequent 4-6 hours post-procedure: 100 %. Analgesia past initial 6 hours: 100 % for approximately 2 days. Ongoing improvement:  Analgesic: The patient indicates having attained 100% relief of the pain for the duration of the local anesthetic followed by an extended 100% relief that lasted for approximately 2 days.  After that, the pain started slowly returning. Function: [name] reports improvement in function while she had good relief of pain. ROM: [name] reports improvement in ROM while her pain was gone.  Pharmacotherapy  Assessment  Analgesic: No chronic opioid analgesics therapy prescribed by our practice. Oxycodone/APAP 10-325, 1 tab p.o. BID by [name], FNP-C, working for Dr. Lillia Carmel, MD. MME/day: 30 mg/day   Monitoring: [place] PMP: PDMP reviewed during this encounter.       Pharmacotherapy: No side-effects or adverse reactions reported. Compliance: No problems identified. Effectiveness: Clinically acceptable.  Newman Pies, RN  06/17/2023  2:21 PM  Sign when Signing Visit Safety precautions to be maintained throughout the outpatient stay will include: orient to surroundings, keep bed in low position, maintain call bell within reach at all times, provide assistance with transfer out of bed and ambulation.     No results found for: "CBDTHCR" No results found for: "D8THCCBX" No results found for: "D9THCCBX"  UDS:  Summary  Date Value Ref Range Status  04/30/2021 Note  Final    Comment:    ==================================================================== Compliance Drug Analysis, Ur ==================================================================== Test                             Result       Flag       Units  Drug Present and Declared for Prescription Verification   Alpha-hydroxyalprazolam        107          EXPECTED   ng/mg creat    Alpha-hydroxyalprazolam is  an expected metabolite of alprazolam.    Source of alprazolam is a scheduled prescription medication.    Oxycodone                      1371         EXPECTED   ng/mg creat   Oxymorphone                    600          EXPECTED   ng/mg creat   Noroxycodone                   2311         EXPECTED   ng/mg creat    Sources of oxycodone include scheduled prescription medications.    Oxymorphone and noroxycodone are expected metabolites of oxycodone.    Oxymorphone is also available as a scheduled prescription medication.    Sertraline                     PRESENT      EXPECTED   Desmethylsertraline             PRESENT      EXPECTED    Desmethylsertraline is an expected metabolite of sertraline.    Trazodone                      PRESENT      EXPECTED   1,3 chlorophenyl piperazine    PRESENT      EXPECTED    1,3-chlorophenyl piperazine is an expected metabolite of trazodone.    Acetaminophen                  PRESENT      EXPECTED  Drug Absent but Declared for Prescription Verification   Diclofenac                     Not Detected UNEXPECTED    Diclofenac, as indicated in the declared medication list, is not    always detected even when used as directed.  ==================================================================== Test                      Result    Flag   Units      Ref Range   Creatinine              45               mg/dL      >=46 ==================================================================== Declared Medications:  The flagging and interpretation on this report are based on the  following declared medications.  Unexpected results may arise from  inaccuracies in the declared medications.   **Note: The testing scope of this panel includes these medications:   Alprazolam (Xanax)  Oxycodone (Percocet)  Sertraline (Zoloft)  Trazodone (Desyrel)   **Note: The testing scope of this panel does not include small to  moderate amounts of these reported medications:   Acetaminophen (Tylenol)  Acetaminophen (Percocet)  Diclofenac (Voltaren)   **Note: The testing scope of this panel does not include the  following reported medications:   Albuterol (Ventolin HFA)  Apixaban (Eliquis)  Atorvastatin (Lipitor)  Bumetanide (Bumex)  Calcium  Flecainide (Tambocor)  Fluticasone (Trelegy)  Iron  Levothyroxine (Synthroid)  Lisinopril (Zestril)  Loratadine (Claritin)  Pantoprazole (Protonix)  Potassium (Klor-Con)  Ubiquinone (CoQ10)  Umeclidinium (Trelegy)  Vilanterol (Trelegy)  Vitamin D ==================================================================== For  clinical  consultation, please call [phone]. ====================================================================       ROS  Constitutional: Denies any fever or chills Gastrointestinal: No reported hemesis, hematochezia, vomiting, or acute GI distress Musculoskeletal: Denies any acute onset joint swelling, redness, loss of ROM, or weakness Neurological: No reported episodes of acute onset apraxia, aphasia, dysarthria, agnosia, amnesia, paralysis, loss of coordination, or loss of consciousness  Medication Review  ALPRAZolam, Budeson-Glycopyrrol-Formoterol, Calcium Carbonate-Vitamin D, CoQ10, albuterol, apixaban, atorvastatin, bumetanide, carvedilol, diclofenac Sodium, empagliflozin, levothyroxine, lisinopril, loratadine, oxyCODONE-acetaminophen, pantoprazole, potassium chloride, sertraline, sodium chloride, and traZODone  History Review  Allergy: Ms. Mckearney is allergic to benadryl [diphenhydramine hcl (sleep)], cetirizine, lasix [furosemide], levaquin [levofloxacin in d5w], meloxicam, and sulfa antibiotics. Drug: Ms. Ravel  reports no history of drug use. Alcohol:  reports no history of alcohol use. Tobacco:  reports that she has quit smoking. Her smoking use included cigarettes. She has never used smokeless tobacco. Social: Ms. Wehner  reports that she has quit smoking. Her smoking use included cigarettes. She has never used smokeless tobacco. She reports that she does not drink alcohol and does not use drugs. Medical:  has a past medical history of Aortic stenosis due to bicuspid aortic valve, Aspiration pneumonia ([name]), Atrial fibrillation with RVR ([name]) (01/11/2015), Atrial flutter ([name]), Basal skull fracture ([name]) (20 yrs ago), CHF (congestive heart failure) ([name]), Chronic respiratory failure ([name]), COPD (chronic obstructive pulmonary disease) ([name]), Deafness in left ear, Essential hypertension (01/24/2015), History of cardiac cath, History of stress test, HLD (hyperlipidemia), HTN  (hypertension), Hypothyroidism, Obesity, PAF (paroxysmal atrial fibrillation) ([name]), Paroxysmal atrial fibrillation ([name]) (01/24/2015), Right upper quadrant abdominal tenderness without rebound tenderness (03/02/2018), and S/P ascending aortic aneurysm repair (2008). Surgical: Ms. Klausen  has a past surgical history that includes Abdominal aortic aneurysm repair (2008); Aortic valve replacement (2008); Abdominal hysterectomy; Carpal tunnel release; Tumor excision (Left); Cardiac catheterization; Cardiac catheterization (N/A, 08/17/2016); CARDIOVERSION (N/A, 08/15/2018); CARDIOVERSION (N/A, 11/14/2018); Cardioversion (N/A, 06/05/2019); Cardioversion (N/A, 08/14/2019); Cardioversion (N/A, 11/09/2019); and Cardioversion (N/A, 01/17/2020). Family: family history includes Stroke in her father and paternal grandmother.  Laboratory Chemistry Profile   Renal Lab Results  Component Value Date   BUN 15 04/27/2023   CREATININE 0.86 04/27/2023   BCR 17 04/27/2023   GFRAA >60 05/14/2020   GFRNONAA >60 03/06/2023    Hepatic Lab Results  Component Value Date   AST 15 04/27/2023   ALT 13 04/27/2023   ALBUMIN 4.0 04/27/2023   ALKPHOS 93 04/27/2023   LIPASE 30 07/10/2022    Electrolytes Lab Results  Component Value Date   NA 138 04/27/2023   K 3.8 04/27/2023   CL 98 04/27/2023   CALCIUM 9.2 04/27/2023   MG 2.4 02/03/2023   PHOS 3.5 01/30/2021    Bone Lab Results  Component Value Date   VD25OH 25.5 (L) 11/25/2020   25OHVITD1 31 04/30/2021   25OHVITD2 <1.0 04/30/2021   25OHVITD3 31 04/30/2021    Inflammation (CRP: Acute Phase) (ESR: Chronic Phase) Lab Results  Component Value Date   CRP 6 04/30/2021   ESRSEDRATE 40 04/30/2021   LATICACIDVEN 1.5 01/25/2022         Note: Above Lab results reviewed.  Recent Imaging Review  MM Outside Films Mammo This examination belongs to an outside facility and is stored here for  comparison purposes only.  Contact the originating outside institution for   any associated report or interpretation. MM Outside Films Mammo This examination belongs to an outside facility and is stored here for  comparison purposes only.  Contact the originating outside institution for  any associated report or interpretation. Note: Reviewed        Physical Exam  General appearance: Well nourished, well developed, and well hydrated. In no apparent acute distress Mental status: Alert, oriented x 3 (person, place, & time)       Respiratory: No evidence of acute respiratory distress Eyes: PERLA Vitals: BP (!) 104/56   Pulse 75   Temp (!) 97.2 F (36.2 C)   Resp 20   Ht 4\' 10"  (1.473 m)   Wt 183 lb (83 kg)   SpO2 92% Comment: 5 l O2  BMI 38.25 kg/m  BMI: Estimated body mass index is 38.25 kg/m as calculated from the following:   Height as of this encounter: 4\' 10"  (1.473 m).   Weight as of this encounter: 183 lb (83 kg). Ideal: Patient must be at least 60 in tall to calculate ideal body weight  Assessment   Diagnosis Status  1. Cervicalgia   2. Chronic upper back pain   3. Chronic myofascial pain   4. Chronic shoulder pain (1ry area of Pain) (Bilateral) (L>R)   5. Musculoskeletal disorder involving upper trapezius muscle   6. Trigger point of shoulder region, unspecified laterality   7. Postop check    Controlled Controlled Controlled   Updated Problems: No problems updated.  Plan of Care  Problem-specific:  No problem-specific Assessment & Plan notes found for this encounter.  [name] has a current medication list which includes the following long-term medication(s): atorvastatin, bumetanide, calcium carbonate-vitamin d, carvedilol, eliquis, levothyroxine, lisinopril, pantoprazole, sertraline, trazodone, and ventolin hfa.  Pharmacotherapy (Medications Ordered): No orders of the defined types were placed in this encounter.  Orders:  Orders Placed This Encounter  Procedures   Nursing Instructions:    Please complete this  patient's postprocedure evaluation.    Scheduling Instructions:     Please complete this patient's postprocedure evaluation.   Follow-up plan:   Return if symptoms worsen or fail to improve.      Interventional Therapies  Risk  Complexity Considerations:   Hard of hearing (HOH)  COPD  O2 dependent  positional orthopnea  Eliquis Anticoagulation: (Stop: 3 days  Restart: 6 hours)  A-fib  prosthetic AoV (Prophylactic ampicillin 2g IV)  Cardiac pacemaker     Planned  Pending:   Palliative bilateral trapezius + supraspinatus m. MNB/TPI #4    Under consideration:   Palliative bilateral trapezius + supraspinatus m. MNB/TPI #4    Completed:   Diagnostic bilateral suprascapular NB x2 (09/25/2021) (100/100/100/100 x 2.5 months)  Therapeutic bilateral Trapezius and supraspinatus MNB x3 (03/16/2023) (100/100/100 x 27 days/80-90)    Therapeutic  Palliative (PRN) options:   Therapeutic upper back and shoulder (trapezius & supraspinatus) MNB/TPI    Pharmacotherapy  Poor candidate for any medication that may induce respiratory depression.         Recent Visits Date Type Provider Dept  06/01/23 Procedure visit Delano Metz, MD [facility]  05/19/23 Office Visit Delano Metz, MD [facility]  04/05/23 Office Visit Delano Metz, MD [facility]  Showing recent visits within past 90 days and meeting all other requirements Today's Visits Date Type Provider Dept  06/17/23 Office Visit Delano Metz, MD [facility]  Showing today's visits and meeting all other requirements Future Appointments No visits were found meeting these conditions. Showing future appointments within next 90 days and meeting all other requirements  I discussed the assessment and treatment plan with  the patient. The patient was provided an opportunity to ask questions and all were answered. The patient agreed with the plan and demonstrated an  understanding of the instructions.  Patient advised to call back or seek an in-person evaluation if the symptoms or condition worsens.  Duration of encounter: 30 minutes.  Total time on encounter, as per AMA guidelines included both the face-to-face and non-face-to-face time personally spent by the physician and/or other qualified health care professional(s) on the day of the encounter (includes time in activities that require the physician or other qualified health care professional and does not include time in activities normally performed by clinical staff). Physician's time may include the following activities when performed: Preparing to see the patient (e.g., pre-charting review of records, searching for previously ordered imaging, lab work, and nerve conduction tests) Review of prior analgesic pharmacotherapies. Reviewing PMP Interpreting ordered tests (e.g., lab work, imaging, nerve conduction tests) Performing post-procedure evaluations, including interpretation of diagnostic procedures Obtaining and/or reviewing separately obtained history Performing a medically appropriate examination and/or evaluation Counseling and educating the patient/family/caregiver Ordering medications, tests, or procedures Referring and communicating with other health care professionals (when not separately reported) Documenting clinical information in the electronic or other health record Independently interpreting results (not separately reported) and communicating results to the patient/ family/caregiver Care coordination (not separately reported)  Note by: Oswaldo Done, MD Date: 06/17/2023; Time: 3:07 PM

## 2023-06-17 NOTE — Progress Notes (Signed)
Safety precautions to be maintained throughout the outpatient stay will include: orient to surroundings, keep bed in low position, maintain call bell within reach at all times, provide assistance with transfer out of bed and ambulation.  

## 2023-06-22 ENCOUNTER — Ambulatory Visit
Admission: RE | Admit: 2023-06-22 | Discharge: 2023-06-22 | Disposition: A | Discharge status: Home / Self Care | Payer: Medicare Other | Source: Ambulatory Visit | Attending: Internal Medicine | Admitting: Internal Medicine

## 2023-06-22 ENCOUNTER — Other Ambulatory Visit: Payer: Self-pay | Admitting: Internal Medicine

## 2023-06-22 DIAGNOSIS — R92323 Mammographic fibroglandular density, bilateral breasts: Secondary | ICD-10-CM | POA: Diagnosis not present

## 2023-06-22 DIAGNOSIS — R2232 Localized swelling, mass and lump, left upper limb: Secondary | ICD-10-CM | POA: Diagnosis not present

## 2023-06-22 DIAGNOSIS — C3411 Malignant neoplasm of upper lobe, right bronchus or lung: Secondary | ICD-10-CM

## 2023-06-22 DIAGNOSIS — R232 Flushing: Secondary | ICD-10-CM

## 2023-06-22 DIAGNOSIS — N6320 Unspecified lump in the left breast, unspecified quadrant: Secondary | ICD-10-CM | POA: Diagnosis not present

## 2023-06-22 DIAGNOSIS — N6332 Unspecified lump in axillary tail of the left breast: Secondary | ICD-10-CM | POA: Diagnosis not present

## 2023-06-22 DIAGNOSIS — Z1231 Encounter for screening mammogram for malignant neoplasm of breast: Secondary | ICD-10-CM

## 2023-06-22 DIAGNOSIS — Z1239 Encounter for other screening for malignant neoplasm of breast: Secondary | ICD-10-CM | POA: Insufficient documentation

## 2023-06-30 ENCOUNTER — Other Ambulatory Visit: Payer: Self-pay | Admitting: Nurse Practitioner

## 2023-06-30 DIAGNOSIS — I480 Paroxysmal atrial fibrillation: Secondary | ICD-10-CM

## 2023-07-01 ENCOUNTER — Telehealth: Payer: Self-pay

## 2023-07-01 DIAGNOSIS — G8929 Other chronic pain: Secondary | ICD-10-CM

## 2023-07-02 MED ORDER — OXYCODONE-ACETAMINOPHEN 10-325 MG PO TABS: 1.0000 | ORAL_TABLET | Freq: Two times a day (BID) | ORAL | 0 refills | Status: DC | PRN

## 2023-07-03 DIAGNOSIS — J449 Chronic obstructive pulmonary disease, unspecified: Secondary | ICD-10-CM | POA: Diagnosis not present

## 2023-07-03 DIAGNOSIS — G4733 Obstructive sleep apnea (adult) (pediatric): Secondary | ICD-10-CM | POA: Diagnosis not present

## 2023-07-05 NOTE — Telephone Encounter (Signed)
Done

## 2023-07-07 ENCOUNTER — Telehealth: Payer: Self-pay | Admitting: Internal Medicine

## 2023-07-09 ENCOUNTER — Ambulatory Visit
Admission: RE | Admit: 2023-07-09 | Discharge: 2023-07-09 | Disposition: A | Discharge status: Home / Self Care | Payer: Medicare Other | Source: Ambulatory Visit | Attending: Radiation Oncology | Admitting: Radiation Oncology

## 2023-07-09 DIAGNOSIS — J432 Centrilobular emphysema: Secondary | ICD-10-CM | POA: Diagnosis not present

## 2023-07-09 DIAGNOSIS — R918 Other nonspecific abnormal finding of lung field: Secondary | ICD-10-CM | POA: Diagnosis not present

## 2023-07-09 DIAGNOSIS — I700 Atherosclerosis of aorta: Secondary | ICD-10-CM | POA: Diagnosis not present

## 2023-07-09 DIAGNOSIS — C3490 Malignant neoplasm of unspecified part of unspecified bronchus or lung: Secondary | ICD-10-CM | POA: Diagnosis not present

## 2023-07-09 LAB — POCT I-STAT CREATININE: Creatinine, Ser: 0.9 mg/dL (ref 0.44–1.00)

## 2023-07-09 MED ORDER — IOHEXOL 300 MG/ML  SOLN
80.0000 mL | Freq: Once | INTRAMUSCULAR | Status: AC | PRN
  Administered 2023-07-09: 80 mL via INTRAVENOUS

## 2023-07-12 ENCOUNTER — Telehealth: Payer: Self-pay

## 2023-07-12 NOTE — Telephone Encounter (Signed)
Pt advised that letter is ready for pick up.

## 2023-07-13 ENCOUNTER — Telehealth: Payer: Self-pay

## 2023-07-14 ENCOUNTER — Other Ambulatory Visit: Payer: Self-pay

## 2023-07-14 DIAGNOSIS — F411 Generalized anxiety disorder: Secondary | ICD-10-CM

## 2023-07-14 MED ORDER — ALPRAZOLAM 0.25 MG PO TABS: 0.2500 mg | ORAL_TABLET | Freq: Two times a day (BID) | ORAL | 2 refills | Status: DC | PRN

## 2023-07-14 NOTE — Telephone Encounter (Signed)
error 

## 2023-07-14 NOTE — Telephone Encounter (Signed)
Called in phar for xanax 0.25 twice a day 60 with 2 refills as per [facility]

## 2023-07-17 ENCOUNTER — Emergency Department
Admission: EM | Admit: 2023-07-17 | Discharge: 2023-07-17 | Disposition: A | Discharge status: Home / Self Care | Payer: Medicare Other | Attending: Emergency Medicine | Admitting: Emergency Medicine

## 2023-07-17 ENCOUNTER — Emergency Department: Payer: Medicare Other

## 2023-07-17 ENCOUNTER — Other Ambulatory Visit: Payer: Self-pay

## 2023-07-17 DIAGNOSIS — M25462 Effusion, left knee: Secondary | ICD-10-CM | POA: Diagnosis not present

## 2023-07-17 DIAGNOSIS — M11262 Other chondrocalcinosis, left knee: Secondary | ICD-10-CM | POA: Diagnosis not present

## 2023-07-17 DIAGNOSIS — M1712 Unilateral primary osteoarthritis, left knee: Secondary | ICD-10-CM | POA: Diagnosis not present

## 2023-07-17 DIAGNOSIS — M25562 Pain in left knee: Secondary | ICD-10-CM | POA: Diagnosis not present

## 2023-07-17 NOTE — Discharge Instructions (Addendum)
Please call the orthopedic team to set up an appointment for follow-up within the next 1 to 2 weeks.  You can use ice and wraps to the affected area.  Also keep it elevated to help with swelling.  Please return for any worsening symptoms.

## 2023-07-17 NOTE — ED Triage Notes (Signed)
Pt to ED via POV for L knee pain since this 2 days ago. Pain is to front of knee and radiates down "to my toes". Pt wears 5L oxygen at baseline. States has "a knot" to L lateral knee area. No bruising or swelling seen. Pt denies injury.

## 2023-07-17 NOTE — ED Provider Notes (Signed)
[facility] Provider Note    Event Date/Time   First MD Initiated Contact with Patient 07/17/23 1342     (approximate)   History   Knee Pain (L)   HPI [name] is a 77 y.o. female presenting today for left knee pain.  Patient states over the past several days she has had slight worsening left knee pain.  She is still able to ambulate on it.  Denies any recent falls with trauma to the left knee.  Has noted a little bit of swelling.  Otherwise denies redness, fever, chills, inability to ambulate.  No pain symptoms elsewhere.     Physical Exam   Triage Vital Signs: ED Triage Vitals  Encounter Vitals Group     BP 07/17/23 1238 95/69     Systolic BP Percentile --      Diastolic BP Percentile --      Pulse Rate 07/17/23 1238 68     Resp 07/17/23 1238 (!) 22     Temp 07/17/23 1238 98 F (36.7 C)     Temp Source 07/17/23 1238 Oral     SpO2 07/17/23 1238 93 %     Weight 07/17/23 1240 185 lb (83.9 kg)     Height 07/17/23 1240 4\' 10"  (1.473 m)     Head Circumference --      Peak Flow --      Pain Score 07/17/23 1237 10     Pain Loc --      Pain Education --      Exclude from Growth Chart --     Most recent vital signs: Vitals:   07/17/23 1238  BP: 95/69  Pulse: 68  Resp: (!) 22  Temp: 98 F (36.7 C)  SpO2: 93%   I have reviewed the vital signs. General:  Awake, alert, no acute distress. Head:  Normocephalic, Atraumatic. EENT:  PERRL, EOMI, Oral mucosa pink and moist, Neck is supple. Cardiovascular: Regular rate, 2+ distal pulses. Respiratory:  Normal respiratory effort, symmetrical expansion, no distress.   Extremities: No tenderness palpation throughout left knee.  Mild pain with range of motion but overall not uncomfortable.  No erythema noted around the left knee. Neuro:  Alert and oriented.  Interacting appropriately.   Skin:  Warm, dry, no rash.   Psych: Appropriate affect.    ED Results / Procedures / Treatments   Labs (all  labs ordered are listed, but only abnormal results are displayed) Labs Reviewed - No data to display   EKG    RADIOLOGY Independently interpreted left knee x-ray with no acute pathology   PROCEDURES:  Critical Care performed: No  Procedures   MEDICATIONS ORDERED IN ED: Medications - No data to display   IMPRESSION / MDM / ASSESSMENT AND PLAN / ED COURSE  I reviewed the triage vital signs and the nursing notes.                              Differential diagnosis includes, but is not limited to, left knee effusion, gout, fracture  Patient's presentation is most consistent with acute complicated illness / injury requiring diagnostic workup.  Patient is a 77 year old female presenting today for nontraumatic left knee pain.  Mild swelling noted at the bedside but minimal pain with range of motion no erythema.  No concern for septic joint.  Still able to ambulate on the knee.  X-ray does show evidence of osteoarthritis as  well as likely calcium deposits indicating possible gout.  Patient is already on substantial pain medication chronically.  At this time, recommended compressive wraps, ice, and elevation.  Told to follow-up with orthopedics as needed.  Given strict return precautions for worsening symptoms.      FINAL CLINICAL IMPRESSION(S) / ED DIAGNOSES   Final diagnoses:  Knee effusion, left     Rx / DC Orders   ED Discharge Orders     None        Note:  This document was prepared using Dragon voice recognition software and may include unintentional dictation errors.   Janith Lima, MD 07/17/23 1409

## 2023-07-20 ENCOUNTER — Other Ambulatory Visit: Payer: Self-pay | Admitting: Family

## 2023-07-21 ENCOUNTER — Encounter: Payer: Self-pay | Admitting: Radiation Oncology

## 2023-07-21 ENCOUNTER — Ambulatory Visit
Admission: RE | Admit: 2023-07-21 | Discharge: 2023-07-21 | Discharge status: Home / Self Care | Payer: Medicare Other | Source: Ambulatory Visit | Attending: Radiation Oncology | Admitting: Radiation Oncology

## 2023-07-21 ENCOUNTER — Other Ambulatory Visit: Payer: Self-pay | Admitting: *Deleted

## 2023-07-21 VITALS — BP 113/70 | HR 70 | Temp 97.7°F | Resp 20

## 2023-07-21 DIAGNOSIS — R918 Other nonspecific abnormal finding of lung field: Secondary | ICD-10-CM

## 2023-07-21 DIAGNOSIS — Z87891 Personal history of nicotine dependence: Secondary | ICD-10-CM | POA: Diagnosis not present

## 2023-07-21 DIAGNOSIS — C3411 Malignant neoplasm of upper lobe, right bronchus or lung: Secondary | ICD-10-CM | POA: Diagnosis not present

## 2023-07-21 NOTE — Progress Notes (Signed)
Radiation Oncology Follow up Note  Name: [name]   Date:   07/21/2023 MRN:  [id number] DOB: [birth year]    This 77 y.o. 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  REFERRING PROVIDER: Lyndon Code, MD  HPI: The patient, a 77 year old individual with a recent history of stage one nonsmall cell lung cancer of the right upper lobe, presents for a follow-up visit after completing SBRT four months ago. She reports experiencing difficulty in breathing, which she attributes to recent weight gain. Despite this, she describes her overall health status as "all right." She also mentions a persistent cough, but without any expectoration. The patient's recent CT scan shows a slight decrease in the size of the right apical nodule, suggesting a positive response to the SBRT treatment..  COMPLICATIONS OF TREATMENT: none  FOLLOW UP COMPLIANCE: keeps appointments   PHYSICAL EXAM:  BP 113/70   Pulse 70   Temp 97.7 F (36.5 C) (Tympanic)   Resp 20  Wheelchair-bound female in NAD on nasal oxygen Well-developed well-nourished patient in NAD. HEENT reveals PERLA, EOMI, discs not visualized.  Oral cavity is clear. No oral mucosal lesions are identified. Neck is clear without evidence of cervical or supraclavicular adenopathy. Lungs are clear to A&P. Cardiac examination is essentially unremarkable with regular rate and rhythm without murmur rub or thrill. Abdomen is benign with no organomegaly or masses noted. Motor sensory and DTR levels are equal and symmetric in the upper and lower extremities. Cranial nerves II through XII are grossly intact. Proprioception is intact. No peripheral adenopathy or edema is identified. No motor or sensory levels are noted. Crude visual fields are within normal range.  RADIOLOGY RESULTS: CT scan reviewed compatible with above-stated findings  PLAN: Stage 1 Non-Small  Cell Lung Cancer (NSCLC) Completed SBRT 4 months ago. Recent CT scan shows interval decrease in size of right apical nodule, consistent with response to treatment. No evidence of metastatic disease. Patient reports stable breathing and no productive cough. -Schedule follow-up visit and CT scan in 6 months to monitor response to treatment.    Andrea Miller, MD

## 2023-07-27 DIAGNOSIS — M1712 Unilateral primary osteoarthritis, left knee: Secondary | ICD-10-CM | POA: Diagnosis not present

## 2023-07-27 DIAGNOSIS — G8929 Other chronic pain: Secondary | ICD-10-CM | POA: Diagnosis not present

## 2023-08-02 ENCOUNTER — Ambulatory Visit: Payer: Medicare Other | Admitting: Internal Medicine

## 2023-08-02 ENCOUNTER — Other Ambulatory Visit: Payer: Self-pay

## 2023-08-02 DIAGNOSIS — G8929 Other chronic pain: Secondary | ICD-10-CM

## 2023-08-02 MED ORDER — OXYCODONE-ACETAMINOPHEN 10-325 MG PO TABS
1.0000 | ORAL_TABLET | Freq: Two times a day (BID) | ORAL | 0 refills | Status: DC | PRN
Start: 2023-08-02 — End: 2023-08-26

## 2023-08-03 DIAGNOSIS — J449 Chronic obstructive pulmonary disease, unspecified: Secondary | ICD-10-CM | POA: Diagnosis not present

## 2023-08-03 DIAGNOSIS — G4733 Obstructive sleep apnea (adult) (pediatric): Secondary | ICD-10-CM | POA: Diagnosis not present

## 2023-08-12 ENCOUNTER — Telehealth: Payer: Self-pay

## 2023-08-12 ENCOUNTER — Other Ambulatory Visit: Payer: Self-pay

## 2023-08-12 DIAGNOSIS — J449 Chronic obstructive pulmonary disease, unspecified: Secondary | ICD-10-CM

## 2023-08-12 MED ORDER — ALBUTEROL SULFATE HFA 108 (90 BASE) MCG/ACT IN AERS: 2.0000 | INHALATION_SPRAY | Freq: Four times a day (QID) | RESPIRATORY_TRACT | 3 refills | Status: DC | PRN

## 2023-08-12 NOTE — Telephone Encounter (Signed)
error 

## 2023-08-24 ENCOUNTER — Other Ambulatory Visit: Payer: Self-pay | Admitting: Family

## 2023-08-26 ENCOUNTER — Encounter: Payer: Self-pay | Admitting: Nurse Practitioner

## 2023-08-26 ENCOUNTER — Ambulatory Visit (INDEPENDENT_AMBULATORY_CARE_PROVIDER_SITE_OTHER): Payer: Medicare Other | Admitting: Nurse Practitioner

## 2023-08-26 VITALS — BP 113/69 | HR 70 | Temp 98.1°F | Resp 16 | Ht <= 58 in | Wt 190.8 lb

## 2023-08-26 DIAGNOSIS — J449 Chronic obstructive pulmonary disease, unspecified: Secondary | ICD-10-CM | POA: Diagnosis not present

## 2023-08-26 DIAGNOSIS — E782 Mixed hyperlipidemia: Secondary | ICD-10-CM | POA: Diagnosis not present

## 2023-08-26 DIAGNOSIS — R7303 Prediabetes: Secondary | ICD-10-CM

## 2023-08-26 DIAGNOSIS — J9611 Chronic respiratory failure with hypoxia: Secondary | ICD-10-CM

## 2023-08-26 DIAGNOSIS — F321 Major depressive disorder, single episode, moderate: Secondary | ICD-10-CM

## 2023-08-26 DIAGNOSIS — Z23 Encounter for immunization: Secondary | ICD-10-CM

## 2023-08-26 DIAGNOSIS — F411 Generalized anxiety disorder: Secondary | ICD-10-CM

## 2023-08-26 DIAGNOSIS — M25511 Pain in right shoulder: Secondary | ICD-10-CM

## 2023-08-26 DIAGNOSIS — Z0000 Encounter for general adult medical examination without abnormal findings: Secondary | ICD-10-CM

## 2023-08-26 DIAGNOSIS — E038 Other specified hypothyroidism: Secondary | ICD-10-CM

## 2023-08-26 DIAGNOSIS — G8929 Other chronic pain: Secondary | ICD-10-CM

## 2023-08-26 DIAGNOSIS — M25512 Pain in left shoulder: Secondary | ICD-10-CM

## 2023-08-26 MED ORDER — LEVOTHYROXINE SODIUM 25 MCG PO TABS
25.0000 ug | ORAL_TABLET | Freq: Every day | ORAL | 1 refills | Status: DC
Start: 2023-08-26 — End: 2023-09-22

## 2023-08-26 MED ORDER — SERTRALINE HCL 100 MG PO TABS
ORAL_TABLET | ORAL | 2 refills | Status: DC
Start: 2023-08-26 — End: 2023-10-06

## 2023-08-26 MED ORDER — OXYCODONE-ACETAMINOPHEN 10-325 MG PO TABS: 1.0000 | ORAL_TABLET | Freq: Two times a day (BID) | ORAL | 0 refills | Status: DC | PRN

## 2023-08-26 MED ORDER — TRAZODONE HCL 50 MG PO TABS
50.0000 mg | ORAL_TABLET | Freq: Every day | ORAL | 1 refills | Status: DC
Start: 2023-08-26 — End: 2023-09-22

## 2023-08-26 MED ORDER — PNEUMOCOCCAL 20-VAL CONJ VACC 0.5 ML IM SUSY
0.5000 mL | PREFILLED_SYRINGE | Freq: Once | INTRAMUSCULAR | 0 refills | Status: DC | PRN
Start: 2023-08-26 — End: 2024-05-10

## 2023-08-26 MED ORDER — ATORVASTATIN CALCIUM 40 MG PO TABS
ORAL_TABLET | ORAL | 1 refills | Status: DC
Start: 2023-08-26 — End: 2024-03-06

## 2023-08-26 MED ORDER — PANTOPRAZOLE SODIUM 40 MG PO TBEC
40.0000 mg | DELAYED_RELEASE_TABLET | Freq: Every day | ORAL | 1 refills | Status: DC
Start: 2023-08-26 — End: 2023-09-22

## 2023-08-26 NOTE — Progress Notes (Signed)
[facility] [address] [place], [place] [address]  Internal MEDICINE  Office Visit Note  Patient Name: [name]  [id number]  [id number]  Date of Service: 08/26/2023  Chief Complaint  Patient presents with   Hyperlipidemia   Hypertension   Medicare Wellness    HPI [name] presents for a medicare annual wellness visit.  Well-appearing 77 y.o. female with AFIB, COPD, CHF, chronic respiratory failure, oxygen dependent, IBS, osteoarthritis, hypothyroidism, anemia, GAD, and depression  Routine CRC screening: due in 2029 Routine mammogram: due in October next year for routine screening mammogram.  DEXA scan:done in 2019 and was normal.  Labs: due for cholesterol panel  New or worsening pain: chronic pain  Other concerns: was informed she is cancer free after finishing her radiation treatments.      08/26/2023    2:40 PM 08/20/2022    2:41 PM 08/20/2022    2:06 PM  MMSE - Mini Mental State Exam  Not completed:   Unable to complete  Orientation to time 5 5   Orientation to Place 5 5   Registration 3 3   Attention/ Calculation 5 5   Recall 3 3   Language- name 2 objects 2 2   Language- repeat 1 0   Language- follow 3 step command 3 2   Language- read & follow direction 1 1   Write a sentence 0 0   Copy design 1 1   Total score 29 27     Functional Status Survey: Is the patient deaf or have difficulty hearing?: Yes Does the patient have difficulty seeing, even when wearing glasses/contacts?: No Does the patient have difficulty concentrating, remembering, or making decisions?: Yes Does the patient have difficulty walking or climbing stairs?: Yes Does the patient have difficulty dressing or bathing?: Yes Does the patient have difficulty doing errands alone such as visiting a doctor's office or shopping?: No     03/16/2023   11:38 AM 04/05/2023    1:47 PM 05/19/2023   12:54 PM 06/17/2023    2:30 PM 08/26/2023    2:37 PM  Fall Risk  Falls in the past  year? 0 0 1 1 1   Was there an injury with Fall?   0 0 0  Fall Risk Category Calculator   2 2 2   Patient at Risk for Falls Due to Impaired balance/gait;Impaired mobility      Fall risk Follow up Falls evaluation completed;Education provided    Falls evaluation completed       08/26/2023    2:38 PM  Depression screen PHQ 2/9  Decreased Interest 0  Down, Depressed, Hopeless 0  PHQ - 2 Score 0       05/05/2022    2:11 PM 04/06/2022    1:53 PM 10/27/2021   11:43 AM 05/22/2021    1:50 PM  GAD 7 : Generalized Anxiety Score  Nervous, Anxious, on Edge 0 0 0 1  Control/stop worrying 0 0 0 1  Worry too much - different things 0 0 0 1  Trouble relaxing 0 0 0 1  Restless 0 0 0 1  Easily annoyed or irritable 0 0 0 1  Afraid - awful might happen 0 0 0 1  Total GAD 7 Score 0 0 0 7  Anxiety Difficulty Not difficult at all Not difficult at all Not difficult at all Somewhat difficult      Current Medication: Outpatient Encounter Medications as of 08/26/2023  Medication Sig   albuterol (VENTOLIN HFA)  108 (90 Base) MCG/ACT inhaler Inhale 2 puffs into the lungs every 6 (six) hours as needed for wheezing or shortness of breath.   ALPRAZolam (XANAX) 0.25 MG tablet Take 1 tablet (0.25 mg total) by mouth 2 (two) times daily as needed for anxiety.   BREZTRI AEROSPHERE 160-9-4.8 MCG/ACT AERO INHALE 2 PUFFS INTO THE LUNGS TWICE DAILY   bumetanide (BUMEX) 1 MG tablet TAKE 2 TABLET BY MOUTH TWICE DAILY, ADDITIONAL 2 TABLETS IN THE EVENING AS NEEDED   Calcium Carbonate-Vitamin D (CALCIUM 600+D PO) Take 1 tablet by mouth daily.   carvedilol (COREG) 25 MG tablet Take 25 mg by mouth 2 (two) times daily with a meal.   Coenzyme Q10 (COQ10) 200 MG CAPS Take 200 mg by mouth daily.   diclofenac Sodium (VOLTAREN) 1 % GEL Apply 2 g topically 4 (four) times daily.   ELIQUIS 5 MG TABS tablet TAKE 1 TABLET BY MOUTH TWICE DAILY   JARDIANCE 10 MG TABS tablet TAKE 1 TABLET(10 MG) BY MOUTH DAILY BEFORE BREAKFAST    lisinopril (ZESTRIL) 40 MG tablet TAKE 1 TABLET(40 MG) BY MOUTH DAILY (Patient taking differently: Take 40 mg by mouth daily.)   loratadine (CLARITIN) 10 MG tablet Take 10 mg by mouth daily.   pneumococcal 20-valent conjugate vaccine (PREVNAR 20) 0.5 ML injection Inject 0.5 mLs into the muscle once as needed for up to 1 dose for immunization.   potassium chloride (KLOR-CON) 10 MEQ tablet Take 10 mEq by mouth 2 (two) times daily.   sodium chloride (OCEAN) 0.65 % SOLN nasal spray Place 1 spray into both nostrils as needed for congestion.   [DISCONTINUED] atorvastatin (LIPITOR) 40 MG tablet TAKE 1 TABLET BY MOUTH EVERY NIGHT AT BEDTIME   [DISCONTINUED] levothyroxine (SYNTHROID) 25 MCG tablet TAKE 1 TABLET BY MOUTH DAILY BEFORE BREAKFAST   [DISCONTINUED] oxyCODONE-acetaminophen (PERCOCET) 10-325 MG tablet Take 1 tablet by mouth 2 (two) times daily as needed for pain.   [DISCONTINUED] pantoprazole (PROTONIX) 40 MG tablet TAKE 1 TABLET BY MOUTH EVERY DAY   [DISCONTINUED] sertraline (ZOLOFT) 100 MG tablet TAKE 1 TABLET BY MOUTH ONCE A DAY FOR ANXIETY (Patient taking differently: Take 100 mg by mouth daily. TAKE 1 TABLET BY MOUTH ONCE A DAY FOR ANXIETY)   [DISCONTINUED] traZODone (DESYREL) 50 MG tablet TAKE 1 TABLET BY MOUTH EVERY DAY AT BEDTIME   atorvastatin (LIPITOR) 40 MG tablet TAKE 1 TABLET BY MOUTH EVERY NIGHT AT BEDTIME   levothyroxine (SYNTHROID) 25 MCG tablet Take 1 tablet (25 mcg total) by mouth daily before breakfast.   oxyCODONE-acetaminophen (PERCOCET) 10-325 MG tablet Take 1 tablet by mouth 2 (two) times daily as needed for pain.   pantoprazole (PROTONIX) 40 MG tablet Take 1 tablet (40 mg total) by mouth daily.   sertraline (ZOLOFT) 100 MG tablet TAKE 1 TABLET BY MOUTH ONCE A DAY FOR ANXIETY   traZODone (DESYREL) 50 MG tablet Take 1 tablet (50 mg total) by mouth at bedtime.   No facility-administered encounter medications on file as of 08/26/2023.    Surgical History: Past Surgical  History:  Procedure Laterality Date   ABDOMINAL AORTIC ANEURYSM REPAIR  2008   ABDOMINAL HYSTERECTOMY     AORTIC VALVE REPLACEMENT  2008   CARDIAC CATHETERIZATION     [facility]   CARDIOVERSION N/A 08/15/2018   Procedure: CARDIOVERSION;  Surgeon: Iran Ouch, MD;  Location: [facility] ORS;  Service: Cardiovascular;  Laterality: N/A;   CARDIOVERSION N/A 11/14/2018   Procedure: CARDIOVERSION (CATH LAB);  Surgeon: Iran Ouch, MD;  Location: [facility] ORS;  Service: Cardiovascular;  Laterality: N/A;   CARDIOVERSION N/A 06/05/2019   Procedure: CARDIOVERSION;  Surgeon: Iran Ouch, MD;  Location: [facility] ORS;  Service: Cardiovascular;  Laterality: N/A;   CARDIOVERSION N/A 08/14/2019   Procedure: CARDIOVERSION;  Surgeon: Iran Ouch, MD;  Location: [facility] ORS;  Service: Cardiovascular;  Laterality: N/A;   CARDIOVERSION N/A 11/09/2019   Procedure: CARDIOVERSION;  Surgeon: Marcina Millard, MD;  Location: [facility] ORS;  Service: Cardiovascular;  Laterality: N/A;   CARDIOVERSION N/A 01/17/2020   Procedure: CARDIOVERSION;  Surgeon: Marcina Millard, MD;  Location: [facility] ORS;  Service: Cardiovascular;  Laterality: N/A;   CARPAL TUNNEL RELEASE     ELECTROPHYSIOLOGIC STUDY N/A 08/17/2016   Procedure: Cardioversion;  Surgeon: Iran Ouch, MD;  Location: [facility] ORS;  Service: Cardiovascular;  Laterality: N/A;   TUMOR EXCISION Left    x3 (arm)    Medical History: Past Medical History:  Diagnosis Date   Aortic 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 s/p bioprosthetic valve replacement 2008 at [facility];  b. 01/2015 Echo: EF 60-65%, no rwma, Gr1 DD, mildly dil LA, nl RV fxn.   Aspiration pneumonia ([name])    Atrial fibrillation with RVR ([name]) 01/11/2015   Atrial flutter ([name])    a. 08/2016 s/p DCCV.  Remains on flecainide 50 mg bid.   Basal skull fracture ([name]) 20 yrs ago   CHF (congestive heart failure) ([name])    Chronic respiratory failure ([name])    COPD (chronic obstructive pulmonary disease) ([name])     a. 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 2008 prior to Aortic aneurysm repair-->nl cors.   History of stress test    a. 10/2015 MV: no ischemia/infarct.   HLD (hyperlipidemia)    HTN (hypertension)    Hypothyroidism    Obesity    PAF (paroxysmal atrial fibrillation) ([name])    a. on Eliquis; b. CHADS2VASc = 3 (HTN, age x 1, female).   Paroxysmal atrial fibrillation ([name]) 01/24/2015   Right upper quadrant abdominal tenderness without rebound tenderness 03/02/2018   S/P ascending aortic aneurysm repair 2008    Family History: Family History  Problem Relation Age of Onset   Stroke Father    Stroke Paternal Grandmother    Breast cancer Neg Hx     Social History   Socioeconomic History   Marital status: Divorced    Spouse name: Not on file   Number of children: Not on file   Years of education: Not on file   Highest education level: Not on file  Occupational History   Not on file  Tobacco Use   Smoking status: Former    Types: Cigarettes   Smokeless tobacco: Never  Vaping Use   Vaping status: Never Used  Substance and Sexual Activity   Alcohol use: No   Drug use: No   Sexual activity: Never    Birth control/protection: Surgical  Other Topics Concern   Not on file  Social History Narrative   Not on file   Social Drivers of Health   Financial Resource Strain: Low Risk  (11/03/2022)   Received [facility], [facility]   Overall Financial Resource Strain (CARDIA)    Difficulty of Paying Living Expenses: Not hard at all  Recent Concern: Physicist, medical Strain -  Medium Risk (11/02/2022)   Received from [facility]   Overall Financial Resource Strain (CARDIA)    Difficulty of Paying Living Expenses: Somewhat hard  Food Insecurity: Food Insecurity Present (04/02/2023)   Hunger Vital Sign     Worried About Running Out of Food in the Last Year: Sometimes true    Ran Out of Food in the Last Year: Sometimes true  Transportation Needs: No Transportation Needs (02/02/2023)   PRAPARE - Administrator, Civil Service (Medical): No    Lack of Transportation (Non-Medical): No  Physical Activity: Not on file  Stress: Not on file  Social Connections: Not on file  Intimate Partner Violence: Not At Risk (04/02/2023)   Humiliation, Afraid, Rape, and Kick questionnaire    Fear of Current or Ex-Partner: No    Emotionally Abused: No    Physically Abused: No    Sexually Abused: No      Review of Systems  Constitutional:  Negative for activity change, appetite change, chills, fatigue, fever and unexpected weight change.  HENT: Negative.  Negative for congestion, ear pain, rhinorrhea, sore throat and trouble swallowing.   Eyes: Negative.   Respiratory:  Positive for shortness of breath (intermittent). Negative for cough, chest tightness and wheezing.   Cardiovascular: Negative.  Negative for chest pain and palpitations.  Gastrointestinal: Negative.  Negative for abdominal pain, blood in stool, constipation, diarrhea, nausea and vomiting.  Endocrine: Negative.   Genitourinary: Negative.  Negative for difficulty urinating, dysuria, frequency, hematuria and urgency.  Musculoskeletal: Negative.  Negative for arthralgias, back pain, joint swelling, myalgias and neck pain.  Skin: Negative.  Negative for rash and wound.  Allergic/Immunologic: Negative.  Negative for immunocompromised state.  Neurological:  Negative for dizziness, seizures, numbness and headaches.  Hematological: Negative.   Psychiatric/Behavioral: Negative.  Negative for behavioral problems, self-injury and suicidal ideas. The patient is not nervous/anxious.     Vital Signs: BP 113/69   Pulse 70   Temp 98.1 F (36.7 C)   Resp 16   Ht 4\' 10"  (1.473 m)   Wt 190 lb 12.8 oz (86.5 kg)   SpO2 94% Comment: 5L  BMI  39.88 kg/m    Physical Exam Vitals reviewed.  Constitutional:      General: She is not in acute distress.    Appearance: Normal appearance. She is obese. She is not ill-appearing.     Interventions: Nasal cannula in place.  HENT:     Head: Normocephalic and atraumatic.  Eyes:     Pupils: Pupils are equal, round, and reactive to light.  Cardiovascular:     Rate and Rhythm: Normal rate and regular rhythm.     Pulses: Normal pulses.     Heart sounds: Normal heart sounds. No murmur heard.    No gallop.  Pulmonary:     Effort: Pulmonary effort is normal. No respiratory distress.     Breath sounds: Normal breath sounds. No wheezing.  Skin:    Capillary Refill: Capillary refill takes less than 2 seconds.  Neurological:     Mental Status: She is alert and oriented to person, place, and time.     Cranial Nerves: No cranial nerve deficit.     Coordination: Coordination normal.     Gait: Gait normal.  Psychiatric:        Mood and Affect: Mood normal.        Behavior: Behavior normal.        Assessment/Plan: 1. Encounter for subsequent annual wellness visit (  AWV) in Medicare patient (Primary) Age-appropriate preventive screenings and vaccinations discussed. Routine labs for health maintenance up to date except for cholesterol panel. PHM updated.    2. Chronic respiratory failure with hypoxia ([name]) Continue supplemental oxygen use as instructed.   3. Chronic obstructive pulmonary disease, unspecified COPD type ([name]) Continue supplemental oxygen use as instructed. Continue breztri as prescribed for maintenance inhaler. Continue prn albuterol inhaler.   4. Prediabetes A1c is normal at 5.5  - POCT glycosylated hemoglobin (Hb A1C)  5. Mixed hyperlipidemia Due for lipid panel, ordered. Continue atorvastatin as prescribed.  - Lipid Profile; Future - atorvastatin (LIPITOR) 40 MG tablet; TAKE 1 TABLET BY MOUTH EVERY NIGHT AT BEDTIME  Dispense: 90 tablet; Refill: 1  6. Chronic  right shoulder pain Continue prn oxycodone as prescribed.   7. Chronic pain of both shoulders Continue prn oxycodone as prescribed   8. Need for vaccination - pneumococcal 20-valent conjugate vaccine (PREVNAR 20) 0.5 ML injection; Inject 0.5 mLs into the muscle once as needed for up to 1 dose for immunization.  Dispense: 0.5 mL; Refill: 0  9. GAD (generalized anxiety disorder) Continue sertraline as prescribed   10. Depression, major, single episode, moderate ([name]) Continue sertraline as prescribed.     General Counseling: [name] verbalizes understanding of the findings of todays visit and agrees with plan of treatment. I have discussed any further diagnostic evaluation that may be needed or ordered today. We also reviewed her medications today. she has been encouraged to call the office with any questions or concerns that should arise related to todays visit.    Orders Placed This Encounter  Procedures   Lipid Profile   POCT glycosylated hemoglobin (Hb A1C)    Meds ordered this encounter  Medications   pneumococcal 20-valent conjugate vaccine (PREVNAR 20) 0.5 ML injection    Sig: Inject 0.5 mLs into the muscle once as needed for up to 1 dose for immunization.    Dispense:  0.5 mL    Refill:  0    Need PCV 20 please   levothyroxine (SYNTHROID) 25 MCG tablet    Sig: Take 1 tablet (25 mcg total) by mouth daily before breakfast.    Dispense:  90 tablet    Refill:  1   atorvastatin (LIPITOR) 40 MG tablet    Sig: TAKE 1 TABLET BY MOUTH EVERY NIGHT AT BEDTIME    Dispense:  90 tablet    Refill:  1   sertraline (ZOLOFT) 100 MG tablet    Sig: TAKE 1 TABLET BY MOUTH ONCE A DAY FOR ANXIETY    Dispense:  90 tablet    Refill:  2   pantoprazole (PROTONIX) 40 MG tablet    Sig: Take 1 tablet (40 mg total) by mouth daily.    Dispense:  90 tablet    Refill:  1   traZODone (DESYREL) 50 MG tablet    Sig: Take 1 tablet (50 mg total) by mouth at bedtime.    Dispense:  90 tablet     Refill:  1   oxyCODONE-acetaminophen (PERCOCET) 10-325 MG tablet    Sig: Take 1 tablet by mouth 2 (two) times daily as needed for pain.    Dispense:  60 tablet    Refill:  0    Not an initial script, this is an increase in the percocet dose.    Return in about 2 months (around 10/27/2023) for F/U, Deziree Mokry PCP.   Total time spent:30 Minutes Time spent includes review of chart, medications,  test results, and follow up plan with the patient.   [place] Controlled Substance Database was reviewed by me.  This patient was seen by Sallyanne Kuster, FNP-C in collaboration with Dr. Beverely Risen as a part of collaborative care agreement.  Aubriel Khanna R. Tedd Sias, MSN, FNP-C Internal medicine

## 2023-09-02 ENCOUNTER — Telehealth: Payer: Self-pay

## 2023-09-02 DIAGNOSIS — G4733 Obstructive sleep apnea (adult) (pediatric): Secondary | ICD-10-CM | POA: Diagnosis not present

## 2023-09-02 DIAGNOSIS — J449 Chronic obstructive pulmonary disease, unspecified: Secondary | ICD-10-CM | POA: Diagnosis not present

## 2023-09-02 DIAGNOSIS — R1011 Right upper quadrant pain: Secondary | ICD-10-CM

## 2023-09-09 NOTE — Telephone Encounter (Signed)
 Having colicky RUQ pain, wants to see GI, referral ordered

## 2023-09-10 ENCOUNTER — Telehealth: Payer: Self-pay | Admitting: Nurse Practitioner

## 2023-09-10 NOTE — Telephone Encounter (Signed)
 GI referral sent via Epic to [facility]. Notified patient-[name]

## 2023-09-14 ENCOUNTER — Ambulatory Visit: Payer: Medicare Other | Admitting: Internal Medicine

## 2023-09-22 ENCOUNTER — Other Ambulatory Visit: Payer: Self-pay | Admitting: Nurse Practitioner

## 2023-09-22 DIAGNOSIS — E038 Other specified hypothyroidism: Secondary | ICD-10-CM

## 2023-09-22 DIAGNOSIS — Z0000 Encounter for general adult medical examination without abnormal findings: Secondary | ICD-10-CM

## 2023-09-23 DIAGNOSIS — H9190 Unspecified hearing loss, unspecified ear: Secondary | ICD-10-CM | POA: Insufficient documentation

## 2023-09-23 NOTE — Patient Instructions (Signed)
______________________________________________________________________    OTC Supplements:   The following is a list of over-the-counter (OTC) supplements that have been found to have [facility] [employer]) studies suggesting that they may be of some benefits when used in moderation in some chronic pain-related conditions.  NOTE:  Always consult with your primary care provider and/or pharmacist before taking any OTC medications to make sure they will not interact with your current medications. Always use manufacturer's recommended dosage.  Supplement Possible benefit May be of benefit in treatment of   Turmeric/curcumin anti-inflammatory Joint and muscle aches and pain.  Glucosamine/chondroitin (triple strength) may slow loss of articular cartilage Joint pain.  Vitamin D-3* may suppress release of chemicals associated with inflammation. Increases tolerance to pain. Joint and muscle aches and pain.   Moringa(+) anti-inflammatory with mild analgesic effects Joint and muscle aches and pain.  Melatonin(+) Helps reset sleep cycle. Insomnia.  Vitamin B-12* may help keep nerves and blood cells healthy as well as maintaining function of nervous system Nerve pain (Burning pain)  Alpha-Lipoic-Acid (ALA)* antioxidant that may help with nerve health, pain, and blocking the activation of some inflammatory chemicals Diabetic neuropathy and metabolic syndrome  superoxide dismutase (SOD)** Currently being reviewed.   Tiger Balm Currently being reviewed.   hydrolyzed collagen peptides* Currently being reviewed.  Collagen supplementation may increases bone strength, density, and mass; may improve joint stiffness/mobility, and functionality; and may reduce joint pain. Possible chondroprotective effects. May help with protection of joint health.   Methylsulfonylmethane (MSM)* Currently being reviewed.   CBD(+) Currently being reviewed.   Delta-8 THC(+) Currently being reviewed.   *  Generally  Recognized As Safe (GRAS) approved substance.-FDA ([employer]) ** "Possibly Safe", but not considered Generally Recognized As Safe (Not GRAS) by the [place] (FDA) as a food additive. (+) Not considered Generally Recognized As Safe (Not GRAS) by the [employer] and Geophysicist/field seismologist (FDA) as a food additive.  ______________________________________________________________________     ______________________________________________________________________    Blood Thinners  IMPORTANT NOTICE:  If you take any of these, make sure to notify the nursing staff.  Failure to do so may result in serious injury.  Recommended time intervals to stop and restart blood-thinners, before & after invasive procedures  Generic Name Brand Name Pre-procedure: Stop medication for this amount of time before your procedure: Post-procedure: Wait this amount of time after the procedure before restarting your medication:  Abciximab Reopro 15 days 2 hrs  Alteplase Activase 10 days 10 days  Anagrelide Agrylin    Apixaban Eliquis 3 days 6 hrs  Cilostazol Pletal 3 days 5 hrs  Clopidogrel Plavix 7-10 days 2 hrs  Dabigatran Pradaxa 5 days 6 hrs  Dalteparin Fragmin 24 hours 4 hrs  Dipyridamole Aggrenox 11days 2 hrs  Edoxaban Lixiana; Savaysa 3 days 2 hrs  Enoxaparin  Lovenox 24 hours 4 hrs  Eptifibatide Integrillin 8 hours 2 hrs  Fondaparinux  Arixtra 72 hours 12 hrs  Hydroxychloroquine Plaquenil 11 days   Prasugrel Effient 7-10 days 6 hrs  Reteplase Retavase 10 days 10 days  Rivaroxaban Xarelto 3 days 6 hrs  Ticagrelor Brilinta 5-7 days 6 hrs  Ticlopidine Ticlid 10-14 days 2 hrs  Tinzaparin Innohep 24 hours 4 hrs  Tirofiban Aggrastat 8 hours 2 hrs  Warfarin Coumadin 5 days 2 hrs   Other medications with blood-thinning effects  Product indications Generic (Brand) names Note  Cholesterol Lipitor  Stop 4 days before procedure  Blood thinner (injectable) Heparin (LMW or LMWH  Heparin) Stop 24 hours before procedure  Cancer Ibrutinib (Imbruvica) Stop 7 days before procedure  Malaria/Rheumatoid Hydroxychloroquine (Plaquenil) Stop 11 days before procedure  Thrombolytics  10 days before or after procedures   Over-the-counter (OTC) Products with blood-thinning effects  Product Common names Stop Time  Aspirin > 325 mg Goody Powders, Excedrin, etc. 11 days  Aspirin <= 81 mg  7 days  Fish oil  4 days  Garlic supplements  7 days  Ginkgo biloba  36 hours  Ginseng  24 hours  NSAIDs Ibuprofen, Naprosyn, etc. 3 days  Vitamin E  4 days   ______________________________________________________________________      ______________________________________________________________________    Procedure instructions  Stop blood-thinners  Do not eat or drink fluids (other than water) for 6 hours before your procedure  No water for 2 hours before your procedure  Take your blood pressure medicine with a sip of water  Arrive 30 minutes before your appointment  If sedation is planned, bring suitable driver. Pennie Banter, Benedetto Goad, & public transportation are NOT APPROVED)  Carefully read the "Preparing for your procedure" detailed instructions  If you have questions call us at [phone]  Procedure appointments are for procedures only. NO medication refills or new problem evaluations.   ______________________________________________________________________      ______________________________________________________________________    Preparing for your procedure  Appointments: If you think you may not be able to keep your appointment, call 24-48 hours in advance to cancel. We need time to make it available to others.  Procedure visits are for procedures only. During your procedure appointment there will be: NO Prescription Refills*. NO medication changes or discussions*. NO discussion of  disability issues*. NO unrelated pain problem evaluations*. NO evaluations to order other pain procedures*. *These will be addressed at a separate and distinct evaluation encounter on the provider's evaluation schedule and not during procedure days.  Instructions: Food intake: Avoid eating anything solid for at least 8 hours prior to your procedure. Clear liquid intake: You may take clear liquids such as water up to 2 hours prior to your procedure. (No carbonated drinks. No soda.) Transportation: Unless otherwise stated by your physician, bring a driver. (Driver cannot be a Market researcher, Pharmacist, community, or any other form of public transportation.) Morning Medicines: Except for blood thinners, take all of your other morning medications with a sip of water. Make sure to take your heart and blood pressure medicines. If your blood pressure's lower number is above 100, the case will be rescheduled. Blood thinners: Make sure to stop your blood thinners as instructed.  If you take a blood thinner, but were not instructed to stop it, call our office [phone] and ask to talk to a nurse. Not stopping a blood thinner prior to certain procedures could lead to serious complications. Diabetics on insulin: Notify the staff so that you can be scheduled 1st case in the morning. If your diabetes requires high dose insulin, take only  of your normal insulin dose the morning of the procedure and notify the staff that you have done so. Preventing infections: Shower with an antibacterial soap the morning of your procedure.  Build-up your immune system: Take 1000 mg of Vitamin C with every meal (3 times a day) the day prior to your procedure. Antibiotics: Inform the nursing staff if you are taking any antibiotics or if you have any conditions that may require antibiotics prior to procedures. (Example: recent joint implants)   Pregnancy: If you are pregnant make sure to notify the nursing staff.  Not doing so may result in injury to the  fetus, including death.  Sickness: If you have a cold, fever, or any active infections, call and cancel or reschedule your procedure. Receiving steroids while having an infection may result in complications. Arrival: You must be in the facility at least 30 minutes prior to your scheduled procedure. Tardiness: Your scheduled time is also the cutoff time. If you do not arrive at least 15 minutes prior to your procedure, you will be rescheduled.  Children: Do not bring any children with you. Make arrangements to keep them home. Dress appropriately: There is always a possibility that your clothing may get soiled. Avoid long dresses. Valuables: Do not bring any jewelry or valuables.  Reasons to call and reschedule or cancel your procedure: (Following these recommendations will minimize the risk of a serious complication.) Surgeries: Avoid having procedures within 2 weeks of any surgery. (Avoid for 2 weeks before or after any surgery). Flu Shots: Avoid having procedures within 2 weeks of a flu shots or . (Avoid for 2 weeks before or after immunizations). Barium: Avoid having a procedure within 7-10 days after having had a radiological study involving the use of radiological contrast. (Myelograms, Barium swallow or enema study). Heart attacks: Avoid any elective procedures or surgeries for the initial 6 months after a "Myocardial Infarction" (Heart Attack). Blood thinners: It is imperative that you stop these medications before procedures. Let us know if you if you take any blood thinner.  Infection: Avoid procedures during or within two weeks of an infection (including chest colds or gastrointestinal problems). Symptoms associated with infections include: Localized redness, fever, chills, night sweats or profuse sweating, burning sensation when voiding, cough, congestion, stuffiness, runny nose, sore throat, diarrhea, nausea, vomiting, cold or Flu symptoms, recent or current infections. It is specially  important if the infection is over the area that we intend to treat. Heart and lung problems: Symptoms that may suggest an active cardiopulmonary problem include: cough, chest pain, breathing difficulties or shortness of breath, dizziness, ankle swelling, uncontrolled high or unusually low blood pressure, and/or palpitations. If you are experiencing any of these symptoms, cancel your procedure and contact your primary care physician for an evaluation.  Remember:  Regular Business hours are:  Monday to Thursday 8:00 AM to 4:00 PM  Provider's Schedule: Delano Metz, MD:  Procedure days: Tuesday and Thursday 7:30 AM to 4:00 PM  Edward Jolly, MD:  Procedure days: Monday and Wednesday 7:30 AM to 4:00 PM Last  Updated: 08/17/2023 ______________________________________________________________________      ______________________________________________________________________    General Risks and Possible Complications  Patient Responsibilities: It is important that you read this as it is part of your informed consent. It is our duty to inform you of the risks and possible complications associated with treatments offered to you. It is your responsibility as a patient to read this and to ask questions about anything that is not clear or that you believe was not covered in this document.  Patient's Rights: You have the right to refuse treatment. You also have the right to change your mind, even after initially having agreed to have the treatment done. However, under this last option, if you wait until the last second to change your mind, you may be charged for the materials used up to that point.  Introduction: Medicine is not an Visual merchandiser. Everything in Medicine, including the lack of treatment(s), carries the potential for danger, harm, or loss (which is by definition: Risk). In Medicine,  a complication is a secondary problem, condition, or disease that can aggravate an already existing  one. All treatments carry the risk of possible complications. The fact that a side effects or complications occurs, does not imply that the treatment was conducted incorrectly. It must be clearly understood that these can happen even when everything is done following the highest safety standards.  No treatment: You can choose not to proceed with the proposed treatment alternative. The "PRO(s)" would include: avoiding the risk of complications associated with the therapy. The "CON(s)" would include: not getting any of the treatment benefits. These benefits fall under one of three categories: diagnostic; therapeutic; and/or palliative. Diagnostic benefits include: getting information which can ultimately lead to improvement of the disease or symptom(s). Therapeutic benefits are those associated with the successful treatment of the disease. Finally, palliative benefits are those related to the decrease of the primary symptoms, without necessarily curing the condition (example: decreasing the pain from a flare-up of a chronic condition, such as incurable terminal cancer).  General Risks and Complications: These are associated to most interventional treatments. They can occur alone, or in combination. They fall under one of the following six (6) categories: no benefit or worsening of symptoms; bleeding; infection; nerve damage; allergic reactions; and/or death. No benefits or worsening of symptoms: In Medicine there are no guarantees, only probabilities. No healthcare provider can ever guarantee that a medical treatment will work, they can only state the probability that it may. Furthermore, there is always the possibility that the condition may worsen, either directly, or indirectly, as a consequence of the treatment. Bleeding: This is more common if the patient is taking a blood thinner, either prescription or over the counter (example: Goody Powders, Fish oil, Aspirin, Garlic, etc.), or if suffering a condition  associated with impaired coagulation (example: Hemophilia, cirrhosis of the liver, low platelet counts, etc.). However, even if you do not have one on these, it can still happen. If you have any of these conditions, or take one of these drugs, make sure to notify your treating physician. Infection: This is more common in patients with a compromised immune system, either due to disease (example: diabetes, cancer, human immunodeficiency virus [HIV], etc.), or due to medications or treatments (example: therapies used to treat cancer and rheumatological diseases). However, even if you do not have one on these, it can still happen. If you have any of these conditions, or take one of these drugs, make sure to notify your treating physician. Nerve Damage: This is more common when the treatment is an invasive one, but it can also happen with the use of medications, such as those used in the treatment of cancer. The damage can occur to small secondary nerves, or to large primary ones, such as those in the spinal cord and brain. This damage may be temporary or permanent and it may lead to impairments that can range from temporary numbness to permanent paralysis and/or brain death. Allergic Reactions: Any time a substance or material comes in contact with our body, there is the possibility of an allergic reaction. These can range from a mild skin rash (contact dermatitis) to a severe systemic reaction (anaphylactic reaction), which can result in death. Death: In general, any medical intervention can result in death, most of the time due to an unforeseen complication. ______________________________________________________________________

## 2023-09-23 NOTE — Progress Notes (Signed)
(  09/27/2023) NO-SHOW.  The patient called on the day of the encounter to cancel indicating that she apparently has an upper respiratory infection.  Because of her COPD, she is having difficulty breathing and has decided to cancel the appointment.

## 2023-09-27 ENCOUNTER — Ambulatory Visit (HOSPITAL_BASED_OUTPATIENT_CLINIC_OR_DEPARTMENT_OTHER): Payer: Medicare Other | Admitting: Pain Medicine

## 2023-09-27 DIAGNOSIS — Z9189 Other specified personal risk factors, not elsewhere classified: Secondary | ICD-10-CM

## 2023-09-27 DIAGNOSIS — G8929 Other chronic pain: Secondary | ICD-10-CM

## 2023-09-27 DIAGNOSIS — M629 Disorder of muscle, unspecified: Secondary | ICD-10-CM

## 2023-09-27 DIAGNOSIS — J449 Chronic obstructive pulmonary disease, unspecified: Secondary | ICD-10-CM

## 2023-09-27 DIAGNOSIS — Z7901 Long term (current) use of anticoagulants: Secondary | ICD-10-CM

## 2023-09-27 DIAGNOSIS — R0601 Orthopnea: Secondary | ICD-10-CM

## 2023-09-27 DIAGNOSIS — Z993 Dependence on wheelchair: Secondary | ICD-10-CM

## 2023-09-27 DIAGNOSIS — J9611 Chronic respiratory failure with hypoxia: Secondary | ICD-10-CM

## 2023-09-27 DIAGNOSIS — H9190 Unspecified hearing loss, unspecified ear: Secondary | ICD-10-CM

## 2023-09-27 DIAGNOSIS — M4810 Ankylosing hyperostosis [Forestier], site unspecified: Secondary | ICD-10-CM

## 2023-09-27 DIAGNOSIS — Z9981 Dependence on supplemental oxygen: Secondary | ICD-10-CM

## 2023-09-27 DIAGNOSIS — Z91199 Patient's noncompliance with other medical treatment and regimen due to unspecified reason: Secondary | ICD-10-CM

## 2023-09-29 ENCOUNTER — Other Ambulatory Visit: Payer: Self-pay | Admitting: Family

## 2023-10-01 ENCOUNTER — Telehealth: Payer: Self-pay

## 2023-10-01 DIAGNOSIS — G8929 Other chronic pain: Secondary | ICD-10-CM

## 2023-10-03 ENCOUNTER — Telehealth: Payer: Self-pay | Admitting: Nurse Practitioner

## 2023-10-03 DIAGNOSIS — J449 Chronic obstructive pulmonary disease, unspecified: Secondary | ICD-10-CM | POA: Diagnosis not present

## 2023-10-03 DIAGNOSIS — G4733 Obstructive sleep apnea (adult) (pediatric): Secondary | ICD-10-CM | POA: Diagnosis not present

## 2023-10-03 MED ORDER — OXYCODONE-ACETAMINOPHEN 10-325 MG PO TABS
1.0000 | ORAL_TABLET | Freq: Two times a day (BID) | ORAL | 0 refills | Status: DC | PRN
Start: 2023-10-03 — End: 2023-11-01

## 2023-10-03 NOTE — Telephone Encounter (Signed)
Patient left 4 vms over weekend regarding oxy refill. [name] sent to [facility] on [address]. I notified patient-[name]

## 2023-10-03 NOTE — Telephone Encounter (Signed)
Refill sent.

## 2023-10-04 NOTE — Progress Notes (Unsigned)
PROVIDER NOTE: Information contained herein reflects review and annotations entered in association with encounter. Interpretation of such information and data should be left to medically-trained personnel. Information provided to patient can be located elsewhere in the medical record under "Patient Instructions". Document created using STT-dictation technology, any transcriptional errors that may result from process are unintentional.    Patient: [name]  Service Category: E/M  Provider: Oswaldo Done, MD  DOB: 1945-12-29  DOS: 10/06/2023  Referring Provider: Lyndon Code, MD  MRN: [id number]  Specialty: Interventional Pain Management  PCP: Lyndon Code, MD  Type: Established Patient  Setting: Ambulatory outpatient    Location: Office  Delivery: Face-to-face     HPI  Ms. LOYD SALVADOR, a 78 y.o. year old female old female, is here today because of her Encounter for chronic pain management [G89.29]. Ms. Phebus's primary complain today is Shoulder Pain and Arm Pain  Pertinent problems: Ms. 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; Shoulder impingement syndrome (Left); Chronic shoulder pain (Left); Chronic pain syndrome; Chronic shoulder pain (1ry area of Pain) (Bilateral) (L>R); Chronic upper extremity pain (2ry area of Pain) (Bilateral) (L>R); Cervicalgia; Chronic neck pain (3ry area of Pain) (Posterior) (Bilateral) (L>R); Shoulder blade pain (4th area of Pain) (Left); Osteoarthritis of glenohumeral joint (Left); Osteoarthritis of AC (acromioclavicular) joint (Left); Osteoarthritis of glenohumeral joints (Bilateral); Osteoarthritis of acromioclavicular joints (Bilateral); Primary osteoarthritis of shoulders (Bilateral); DDD (degenerative disc disease), cervical; Cervical radiculitis (Left); Cervical radiculopathy (Left); C6 radiculopathy (Left); C7 radiculopathy (Left); Unable to tolerate prone position due to orthopnea; Chronic intractable headache; DISH (diffuse idiopathic  skeletal hyperostosis); Abnormal CT scan, cervical spine (03/04/2022); Trigger point of shoulder region (Bilateral); Trigger point with neck pain; Chronic upper back pain; Chronic myofascial pain; and Musculoskeletal disorder involving upper trapezius muscle on their pertinent problem list. Pain Assessment: Severity of Chronic pain is reported as a 10-Worst pain ever/10. Location: Shoulder Right, Left/Radaites from both shoulders to arms bilateral. Onset: More than a month ago. Quality: Constant, Aching, Dull. Timing: Constant. Modifying factor(s): Denies. Vitals:  height is 4\' 10"  (1.473 m) and weight is 185 lb (83.9 kg). Her temporal temperature is 97.7 F (36.5 C). Her blood pressure is 106/56 (abnormal) and her pulse is 71. Her respiration is 18 and oxygen saturation is 92%.  BMI: Estimated body mass index is 38.67 kg/m as calculated from the following:   Height as of this encounter: 4\' 10"  (1.473 m).   Weight as of this encounter: 185 lb (83.9 kg). Last encounter: 09/27/2023. Last procedure: 06/01/2023.  Reason for encounter: evaluation of worsening, or previously known (established) problem.  Discussed the use of AI scribe software for clinical note transcription with the patient, who gave verbal consent to proceed.  History of Present Illness   The patient, with COPD and spinal stenosis, presents with breathing difficulties and pain management issues. She is 78 years old. with breathing difficulties and pain management issues.  She experiences ongoing breathing difficulties due to COPD, which have previously resulted in hospitalization. Her breathing is consistently poor, and she requires supplemental oxygen therapy. She requires assistance with transportation due to her COPD and oxygen needs.  She manages her pain with daily vitamin D and turmeric supplements, which provide slight relief but do not completely alleviate her pain. She has spinal stenosis in the cervical region, necessitating careful management, especially when considering injections near the  spine.  She is on anticoagulation therapy and inquires about the necessity of stopping it for certain procedures. She notes no bleeding issues with other injections, such as pneumonia or  knee injections.  She discusses her family dynamics, including her sister and granddaughter, in relation to her transportation needs. Her sister is almost 23, and her granddaughter is twenty-three but does not drive.       Pharmacotherapy Assessment  Analgesic: No chronic opioid analgesics therapy prescribed by our practice. Oxycodone/APAP 10-325, 1 tab p.o. BID by [name], FNP-C, working for Dr. Lillia Carmel, MD. MME/day: 30 mg/day   Monitoring: [place] PMP: PDMP reviewed during this encounter.       Pharmacotherapy: No side-effects or adverse reactions reported. Compliance: No problems identified. Effectiveness: Clinically acceptable.  Earlyne Iba, RN  10/06/2023  3:06 PM  Sign when Signing Visit Safety precautions to be maintained throughout the outpatient stay will include: orient to surroundings, keep bed in low position, maintain call bell within reach at all times, provide assistance with transfer out of bed and ambulation.     No results found for: "CBDTHCR" No results found for: "D8THCCBX" No results found for: "D9THCCBX"  UDS:  Summary  Date Value Ref Range Status  04/30/2021 Note  Final    Comment:    ==================================================================== Compliance Drug Analysis, Ur ==================================================================== Test                             Result       Flag       Units  Drug Present and Declared for Prescription Verification   Alpha-hydroxyalprazolam        107          EXPECTED   ng/mg creat    Alpha-hydroxyalprazolam is an expected metabolite of alprazolam.    Source of alprazolam is a scheduled prescription medication.    Oxycodone                      1371         EXPECTED   ng/mg creat   Oxymorphone                     600          EXPECTED   ng/mg creat   Noroxycodone                   2311         EXPECTED   ng/mg creat    Sources of oxycodone include scheduled prescription medications.    Oxymorphone and noroxycodone are expected metabolites of oxycodone.    Oxymorphone is also available as a scheduled prescription medication.    Sertraline                     PRESENT      EXPECTED   Desmethylsertraline            PRESENT      EXPECTED    Desmethylsertraline is an expected metabolite of sertraline.    Trazodone                      PRESENT      EXPECTED   1,3 chlorophenyl piperazine    PRESENT      EXPECTED    1,3-chlorophenyl piperazine is an expected metabolite of trazodone.    Acetaminophen                  PRESENT      EXPECTED  Drug Absent but Declared for Prescription Verification  Diclofenac                     Not Detected UNEXPECTED    Diclofenac, as indicated in the declared medication list, is not    always detected even when used as directed.  ==================================================================== Test                      Result    Flag   Units      Ref Range   Creatinine              45               mg/dL      >=16 ==================================================================== Declared Medications:  The flagging and interpretation on this report are based on the  following declared medications.  Unexpected results may arise from  inaccuracies in the declared medications.   **Note: The testing scope of this panel includes these medications:   Alprazolam (Xanax)  Oxycodone (Percocet)  Sertraline (Zoloft)  Trazodone (Desyrel)   **Note: The testing scope of this panel does not include small to  moderate amounts of these reported medications:   Acetaminophen (Tylenol)  Acetaminophen (Percocet)  Diclofenac (Voltaren)   **Note: The testing scope of this panel does not include the  following reported medications:   Albuterol (Ventolin  HFA)  Apixaban (Eliquis)  Atorvastatin (Lipitor)  Bumetanide (Bumex)  Calcium  Flecainide (Tambocor)  Fluticasone (Trelegy)  Iron  Levothyroxine (Synthroid)  Lisinopril (Zestril)  Loratadine (Claritin)  Pantoprazole (Protonix)  Potassium (Klor-Con)  Ubiquinone (CoQ10)  Umeclidinium (Trelegy)  Vilanterol (Trelegy)  Vitamin D ==================================================================== For clinical consultation, please call [phone]. ====================================================================       ROS  Constitutional: Denies any fever or chills Gastrointestinal: No reported hemesis, hematochezia, vomiting, or acute GI distress Musculoskeletal: Denies any acute onset joint swelling, redness, loss of ROM, or weakness Neurological: No reported episodes of acute onset apraxia, aphasia, dysarthria, agnosia, amnesia, paralysis, loss of coordination, or loss of consciousness  Medication Review  ALPRAZolam, Budeson-Glycopyrrol-Formoterol, Calcium Carbonate-Vitamin D, CoQ10, albuterol, apixaban, atorvastatin, bumetanide, carvedilol, diclofenac Sodium, empagliflozin, levothyroxine, lisinopril, loratadine, oxyCODONE-acetaminophen, pantoprazole, pneumococcal 20-valent conjugate vaccine, potassium chloride, sodium chloride, and traZODone  History Review  Allergy: Ms. Mcauley is allergic to benadryl [diphenhydramine hcl (sleep)], cetirizine, lasix [furosemide], levaquin [levofloxacin in d5w], meloxicam, and sulfa antibiotics. Drug: Ms. Podolski  reports no history of drug use. Alcohol:  reports no history of alcohol use. Tobacco:  reports that she has quit smoking. Her smoking use included cigarettes. She has never used smokeless tobacco. Social: Ms. Petion  reports that she has quit smoking. Her smoking use included cigarettes. She has never used smokeless tobacco. She reports that she does not drink alcohol and does not use drugs. Medical:  has a past medical history of  Aortic stenosis due to bicuspid aortic valve, Aspiration pneumonia ([name]), Atrial fibrillation with RVR ([name]) (01/11/2015), Atrial flutter ([name]), Basal skull fracture ([name]) (20 yrs ago), CHF (congestive heart failure) ([name]), Chronic respiratory failure ([name]), COPD (chronic obstructive pulmonary disease) ([name]), Deafness in left ear, Essential hypertension (01/24/2015), History of cardiac cath, History of stress test, HLD (hyperlipidemia), HTN (hypertension), Hypothyroidism, Obesity, PAF (paroxysmal atrial fibrillation) ([name]), Paroxysmal atrial fibrillation ([name]) (01/24/2015), Right upper quadrant abdominal tenderness without rebound tenderness (03/02/2018), and S/P ascending aortic aneurysm repair (2008). Surgical: Ms. Hardebeck  has a past surgical history that includes Abdominal aortic aneurysm repair (2008); Aortic valve replacement (2008); Abdominal hysterectomy; Carpal tunnel release; Tumor excision (Left);  Cardiac catheterization; Cardiac catheterization (N/A, 08/17/2016); CARDIOVERSION (N/A, 08/15/2018); CARDIOVERSION (N/A, 11/14/2018); Cardioversion (N/A, 06/05/2019); Cardioversion (N/A, 08/14/2019); Cardioversion (N/A, 11/09/2019); and Cardioversion (N/A, 01/17/2020). Family: family history includes Stroke in her father and paternal grandmother.  Laboratory Chemistry Profile   Renal Lab Results  Component Value Date   BUN 15 04/27/2023   CREATININE 0.90 07/09/2023   BCR 17 04/27/2023   GFRAA >60 05/14/2020   GFRNONAA >60 03/06/2023    Hepatic Lab Results  Component Value Date   AST 15 04/27/2023   ALT 13 04/27/2023   ALBUMIN 4.0 04/27/2023   ALKPHOS 93 04/27/2023   LIPASE 30 07/10/2022    Electrolytes Lab Results  Component Value Date   NA 138 04/27/2023   K 3.8 04/27/2023   CL 98 04/27/2023   CALCIUM 9.2 04/27/2023   MG 2.4 02/03/2023   PHOS 3.5 01/30/2021    Bone Lab Results  Component Value Date   VD25OH 25.5 (L) 11/25/2020   25OHVITD1 31 04/30/2021   25OHVITD2 <1.0 04/30/2021    25OHVITD3 31 04/30/2021    Inflammation (CRP: Acute Phase) (ESR: Chronic Phase) Lab Results  Component Value Date   CRP 6 04/30/2021   ESRSEDRATE 40 04/30/2021   LATICACIDVEN 1.5 01/25/2022         Note: Above Lab results reviewed.  Recent Imaging Review  CT Chest W Contrast CLINICAL DATA:  Non-small cell lung cancer. Assess treatment response. * Tracking Code: BO *  EXAM: CT CHEST WITH CONTRAST  TECHNIQUE: Multidetector CT imaging of the chest was performed during intravenous contrast administration.  RADIATION DOSE REDUCTION: This exam was performed according to the departmental dose-optimization program which includes automated exposure control, adjustment of the mA and/or kV according to patient size and/or use of iterative reconstruction technique.  CONTRAST:  80mL OMNIPAQUE IOHEXOL 300 MG/ML  SOLN  COMPARISON:  Chest CT 03/15/2023 and 06/02/2022.  PET-CT 06/29/2022.  FINDINGS: Cardiovascular: No acute vascular findings are demonstrated. Patient is status post median sternotomy and TAVR. Left subclavian pacemaker leads extend into the right atrium and right ventricle. There is atherosclerosis of the aorta, great vessels and coronary arteries. Stable mild cardiac enlargement. No significant pericardial effusion.  Mediastinum/Nodes: There are no enlarged mediastinal, hilar or axillary lymph nodes.Scattered small mediastinal lymph nodes appear unchanged. Hilar assessment is limited by the lack of intravenous contrast, although the hilar contours appear unchanged. The thyroid gland, trachea and esophagus demonstrate no significant findings.  Lungs/Pleura: No pleural effusion or pneumothorax. There is moderate centrilobular and paraseptal emphysema with diffuse central airway thickening. The right apical nodule appears slightly smaller, measuring 1.1 x 1.0 cm on image 36/3 (previously 1.2 x 1.1 cm). No new or enlarging pulmonary nodules are identified. There are  chronic dependent opacities at both lung bases, with mild interval worsening on the left.  Upper abdomen: The visualized upper abdomen appears stable, without acute findings. Small gallstones are noted. There is no adrenal mass.  Musculoskeletal/Chest wall: There is no chest wall mass or suspicious osseous finding. Healed median sternotomy. Multilevel spondylosis with advanced glenohumeral degenerative changes bilaterally.  Unless specific follow-up recommendations are mentioned in the findings or impression sections, no imaging follow-up of any mentioned incidental findings is recommended.  IMPRESSION: 1. Slight interval decrease in size of the right apical nodule compatible with response to treatment. 2. No evidence of metastatic disease. 3. Chronic dependent opacities at both lung bases, with mild interval worsening on the left, likely atelectasis. 4. Cholelithiasis. 5. Aortic Atherosclerosis (ICD10-I70.0) and Emphysema (ICD10-J43.9).  Electronically Signed   By: Carey Bullocks M.D.   On: 07/20/2023 08:15 Note: Reviewed        Physical Exam  General appearance: Well nourished, well developed, and well hydrated. In no apparent acute distress Mental status: Alert, oriented x 3 (person, place, & time)       Respiratory: No evidence of acute respiratory distress Eyes: PERLA Vitals: BP (!) 106/56 (Cuff Size: Normal)   Pulse 71   Temp 97.7 F (36.5 C) (Temporal)   Resp 18   Ht 4\' 10"  (1.473 m)   Wt 185 lb (83.9 kg)   SpO2 92% Comment: 5L of O2  BMI 38.67 kg/m  BMI: Estimated body mass index is 38.67 kg/m as calculated from the following:   Height as of this encounter: 4\' 10"  (1.473 m).   Weight as of this encounter: 185 lb (83.9 kg). Ideal: Patient must be at least 60 in tall to calculate ideal body weight  Assessment   Diagnosis Status  1. Encounter for chronic pain management   2. Chronic upper back pain   3. Chronic myofascial pain   4. Chronic shoulder pain  (1ry area of Pain) (Bilateral) (L>R)   5. Musculoskeletal disorder involving upper trapezius muscle   6. Trigger point of shoulder region, unspecified laterality   7. Trigger point with neck pain   8. At high risk for postoperative complications   9. COPD with hypoxia ([name])   10. Chronic anticoagulation (Eliquis)   11. Wheelchair dependence   12. Unable to tolerate prone position due to orthopnea   13. Abnormal CT scan, cervical spine (03/04/2022)    Recurring Persistent Recurring   Updated Problems: No problems updated.  Plan of Care  Problem-specific:  Assessment and Plan    Pain Management Turmeric is being used effectively for pain management. Additional spinal injections are under consideration. It is crucial to discontinue anticoagulants before these injections to prevent spinal cord compression and potential paralysis. Schedule spinal injections.  Chronic Obstructive Pulmonary Disease (COPD) Recent hospitalization for respiratory distress has led to ongoing oxygen therapy. Anticoagulants must be stopped before spinal injections to avoid spinal cord compression and potential paralysis. Continue oxygen therapy and schedule spinal injections. Provide a transportation assistance letter due to COPD and oxygen dependency.  Vitamin D Deficiency Vitamin D supplementation has started with noted improvement. Continue supplementation.  General Health Maintenance A pneumonia vaccination and knee injection are needed. Currently on anticoagulants. Administer the pneumonia vaccine and knee injection as needed.  Follow-up Coordinate with the nurse to schedule injections and provide necessary documentation for transportation assistance.       Ms. ERYNNE KEALEY has a current medication list which includes the following long-term medication(s): albuterol, atorvastatin, bumetanide, calcium carbonate-vitamin d, carvedilol, eliquis, levothyroxine, lisinopril, pantoprazole, and  trazodone.  Pharmacotherapy (Medications Ordered): No orders of the defined types were placed in this encounter.  Orders:  Orders Placed This Encounter  Procedures   TRIGGER POINT INJECTION    Standing Status:   Future    Expiration Date:   01/04/2024    Scheduling Instructions:     Area: Upper Back     Side: Bilateral     Sedation: No Sedation.     Timeframe: ASAA    Where will this procedure be performed?:   [facility]   Blood Thinner Instructions to Nursing    Always make sure patient has clearance from prescribing physician to stop blood thinners for interventional therapies. If the patient requires a Lovenox-bridge  therapy, make sure arrangements are made to institute it with the assistance of the PCP.    Scheduling Instructions:     Have Ms. Westhoff stop the Eliquis (Apixaban) x 3 days prior to procedure or surgery.   PT TENS evaluation    Scheduling Instructions:     Please set this patient up with a TENS unit to be used for acute and subacute flareups of herpain   Follow-up plan:   Return for [facility]): (B) Uppper back TPIs #5, (Blood Thinner Protocol).      Interventional Therapies  Risk  Complexity Considerations:   Hard of hearing (HOH)  COPD  O2 dependent  positional orthopnea  Eliquis Anticoagulation: (Stop: 3 days  Restart: 6 hours)  A-fib  prosthetic AoV (Prophylactic ampicillin 2g IV)  Cardiac pacemaker     Planned  Pending:   Palliative bilateral trapezius + supraspinatus m. MNB/TPI #5    Under consideration:   Palliative bilateral trapezius + supraspinatus m. MNB/TPI #5    Completed:   Diagnostic bilateral suprascapular NB x2 (09/25/2021) (100/100/100/100 x 2.5 months)  Therapeutic bilateral Trapezius and supraspinatus MNB x4 (06/01/2023) (100/100/100 x 27 days/80-90)    Therapeutic  Palliative (PRN) options:   Therapeutic upper back and shoulder (trapezius & supraspinatus) MNB/TPI    Pharmacotherapy  Poor candidate for any medication  that may induce respiratory depression.      Recent Visits No visits were found meeting these conditions. Showing recent visits within past 90 days and meeting all other requirements Today's Visits Date Type Provider Dept  10/06/23 Office Visit Delano Metz, MD [facility]  Showing today's visits and meeting all other requirements Future Appointments No visits were found meeting these conditions. Showing future appointments within next 90 days and meeting all other requirements  I discussed the assessment and treatment plan with the patient. The patient was provided an opportunity to ask questions and all were answered. The patient agreed with the plan and demonstrated an understanding of the instructions.  Patient advised to call back or seek an in-person evaluation if the symptoms or condition worsens.  Duration of encounter: 30 minutes.  Total time on encounter, as per AMA guidelines included both the face-to-face and non-face-to-face time personally spent by the physician and/or other qualified health care professional(s) on the day of the encounter (includes time in activities that require the physician or other qualified health care professional and does not include time in activities normally performed by clinical staff). Physician's time may include the following activities when performed: Preparing to see the patient (e.g., pre-charting review of records, searching for previously ordered imaging, lab work, and nerve conduction tests) Review of prior analgesic pharmacotherapies. Reviewing PMP Interpreting ordered tests (e.g., lab work, imaging, nerve conduction tests) Performing post-procedure evaluations, including interpretation of diagnostic procedures Obtaining and/or reviewing separately obtained history Performing a medically appropriate examination and/or evaluation Counseling and educating the patient/family/caregiver Ordering medications, tests, or  procedures Referring and communicating with other health care professionals (when not separately reported) Documenting clinical information in the electronic or other health record Independently interpreting results (not separately reported) and communicating results to the patient/ family/caregiver Care coordination (not separately reported)  Note by: Oswaldo Done, MD Date: 10/06/2023; Time: 3:26 PM

## 2023-10-05 ENCOUNTER — Telehealth: Payer: Self-pay

## 2023-10-05 NOTE — Telephone Encounter (Signed)
Patient called to report that she has misplaced her Zoloft and is not able to get more until timing is appropriate per pharmacy. Patient asked if it is okay to stop Zoloft for now, per AA it is okay. Advised patient of possible side effects of abruptly stopping Zoloft, patient was okay with this. Advised patient to call back is anxiety symptoms worsen.

## 2023-10-06 ENCOUNTER — Ambulatory Visit: Payer: Medicare Other | Attending: Pain Medicine | Admitting: Pain Medicine

## 2023-10-06 ENCOUNTER — Encounter: Payer: Self-pay | Admitting: Pain Medicine

## 2023-10-06 VITALS — BP 106/56 | HR 71 | Temp 97.7°F | Resp 18 | Ht <= 58 in | Wt 185.0 lb

## 2023-10-06 DIAGNOSIS — Z7901 Long term (current) use of anticoagulants: Secondary | ICD-10-CM | POA: Diagnosis not present

## 2023-10-06 DIAGNOSIS — M7918 Myalgia, other site: Secondary | ICD-10-CM | POA: Diagnosis not present

## 2023-10-06 DIAGNOSIS — G8929 Other chronic pain: Secondary | ICD-10-CM

## 2023-10-06 DIAGNOSIS — R6889 Other general symptoms and signs: Secondary | ICD-10-CM | POA: Diagnosis not present

## 2023-10-06 DIAGNOSIS — M25519 Pain in unspecified shoulder: Secondary | ICD-10-CM

## 2023-10-06 DIAGNOSIS — R937 Abnormal findings on diagnostic imaging of other parts of musculoskeletal system: Secondary | ICD-10-CM | POA: Diagnosis not present

## 2023-10-06 DIAGNOSIS — M629 Disorder of muscle, unspecified: Secondary | ICD-10-CM

## 2023-10-06 DIAGNOSIS — Z9189 Other specified personal risk factors, not elsewhere classified: Secondary | ICD-10-CM | POA: Diagnosis not present

## 2023-10-06 DIAGNOSIS — Z993 Dependence on wheelchair: Secondary | ICD-10-CM | POA: Diagnosis not present

## 2023-10-06 DIAGNOSIS — M25512 Pain in left shoulder: Secondary | ICD-10-CM | POA: Insufficient documentation

## 2023-10-06 DIAGNOSIS — M542 Cervicalgia: Secondary | ICD-10-CM

## 2023-10-06 DIAGNOSIS — M25511 Pain in right shoulder: Secondary | ICD-10-CM | POA: Diagnosis not present

## 2023-10-06 DIAGNOSIS — M549 Dorsalgia, unspecified: Secondary | ICD-10-CM | POA: Insufficient documentation

## 2023-10-06 DIAGNOSIS — J449 Chronic obstructive pulmonary disease, unspecified: Secondary | ICD-10-CM | POA: Diagnosis not present

## 2023-10-06 NOTE — Progress Notes (Signed)
Safety precautions to be maintained throughout the outpatient stay will include: orient to surroundings, keep bed in low position, maintain call bell within reach at all times, provide assistance with transfer out of bed and ambulation.

## 2023-10-06 NOTE — Patient Instructions (Signed)
______________________________________________________________________    Procedure instructions  Stop blood-thinners  Do not eat or drink fluids (other than water) for 6 hours before your procedure  No water for 2 hours before your procedure  Take your blood pressure medicine with a sip of water  Arrive 30 minutes before your appointment  If sedation is planned, bring suitable driver. Pennie Banter, Benedetto Goad, & public transportation are NOT APPROVED)  Carefully read the "Preparing for your procedure" detailed instructions  If you have questions call us at [phone]  Procedure appointments are for procedures only. NO medication refills or new problem evaluations.   ______________________________________________________________________      ______________________________________________________________________    Preparing for your procedure  Appointments: If you think you may not be able to keep your appointment, call 24-48 hours in advance to cancel. We need time to make it available to others.  Procedure visits are for procedures only. During your procedure appointment there will be: NO Prescription Refills*. NO medication changes or discussions*. NO discussion of disability issues*. NO unrelated pain problem evaluations*. NO evaluations to order other pain procedures*. *These will be addressed at a separate and distinct evaluation encounter on the provider's evaluation schedule and not during procedure days.  Instructions: Food intake: Avoid eating anything solid for at least 8 hours prior to your procedure. Clear liquid intake: You may take clear liquids such as water up to 2 hours prior to your procedure. (No carbonated drinks. No soda.) Transportation: Unless otherwise stated by your physician, bring a driver. (Driver cannot be a Market researcher, Pharmacist, community, or any other form of public transportation.) Morning Medicines: Except for blood thinners, take all of your other morning  medications with a sip of water. Make sure to take your heart and blood pressure medicines. If your blood pressure's lower number is above 100, the case will be rescheduled. Blood thinners: Make sure to stop your blood thinners as instructed.  If you take a blood thinner, but were not instructed to stop it, call our office [phone] and ask to talk to a nurse. Not stopping a blood thinner prior to certain procedures could lead to serious complications. Diabetics on insulin: Notify the staff so that you can be scheduled 1st case in the morning. If your diabetes requires high dose insulin, take only  of your normal insulin dose the morning of the procedure and notify the staff that you have done so. Preventing infections: Shower with an antibacterial soap the morning of your procedure.  Build-up your immune system: Take 1000 mg of Vitamin C with every meal (3 times a day) the day prior to your procedure. Antibiotics: Inform the nursing staff if you are taking any antibiotics or if you have any conditions that may require antibiotics prior to procedures. (Example: recent joint implants)   Pregnancy: If you are pregnant make sure to notify the nursing staff. Not doing so may result in injury to the fetus, including death.  Sickness: If you have a cold, fever, or any active infections, call and cancel or reschedule your procedure. Receiving steroids while having an infection may result in complications. Arrival: You must be in the facility at least 30 minutes prior to your scheduled procedure. Tardiness: Your scheduled time is also the cutoff time. If you do not arrive at least 15 minutes prior to your procedure, you will be rescheduled.  Children: Do not bring any children with you. Make arrangements to keep them home. Dress appropriately: There is always a possibility that your clothing may get  soiled. Avoid long dresses. Valuables: Do not bring any jewelry or valuables.  Reasons to call and  reschedule or cancel your procedure: (Following these recommendations will minimize the risk of a serious complication.) Surgeries: Avoid having procedures within 2 weeks of any surgery. (Avoid for 2 weeks before or after any surgery). Flu Shots: Avoid having procedures within 2 weeks of a flu shots or . (Avoid for 2 weeks before or after immunizations). Barium: Avoid having a procedure within 7-10 days after having had a radiological study involving the use of radiological contrast. (Myelograms, Barium swallow or enema study). Heart attacks: Avoid any elective procedures or surgeries for the initial 6 months after a "Myocardial Infarction" (Heart Attack). Blood thinners: It is imperative that you stop these medications before procedures. Let us know if you if you take any blood thinner.  Infection: Avoid procedures during or within two weeks of an infection (including chest colds or gastrointestinal problems). Symptoms associated with infections include: Localized redness, fever, chills, night sweats or profuse sweating, burning sensation when voiding, cough, congestion, stuffiness, runny nose, sore throat, diarrhea, nausea, vomiting, cold or Flu symptoms, recent or current infections. It is specially important if the infection is over the area that we intend to treat. Heart and lung problems: Symptoms that may suggest an active cardiopulmonary problem include: cough, chest pain, breathing difficulties or shortness of breath, dizziness, ankle swelling, uncontrolled high or unusually low blood pressure, and/or palpitations. If you are experiencing any of these symptoms, cancel your procedure and contact your primary care physician for an evaluation.  Remember:  Regular Business hours are:  Monday to Thursday 8:00 AM to 4:00 PM  Provider's Schedule: Delano Metz, MD:  Procedure days: Tuesday and Thursday 7:30 AM to 4:00 PM  Edward Jolly, MD:  Procedure days: Monday and Wednesday 7:30 AM to 4:00  PM Last  Updated: 08/17/2023 ______________________________________________________________________      ______________________________________________________________________    General Risks and Possible Complications  Patient Responsibilities: It is important that you read this as it is part of your informed consent. It is our duty to inform you of the risks and possible complications associated with treatments offered to you. It is your responsibility as a patient to read this and to ask questions about anything that is not clear or that you believe was not covered in this document.  Patient's Rights: You have the right to refuse treatment. You also have the right to change your mind, even after initially having agreed to have the treatment done. However, under this last option, if you wait until the last second to change your mind, you may be charged for the materials used up to that point.  Introduction: Medicine is not an Visual merchandiser. Everything in Medicine, including the lack of treatment(s), carries the potential for danger, harm, or loss (which is by definition: Risk). In Medicine, a complication is a secondary problem, condition, or disease that can aggravate an already existing one. All treatments carry the risk of possible complications. The fact that a side effects or complications occurs, does not imply that the treatment was conducted incorrectly. It must be clearly understood that these can happen even when everything is done following the highest safety standards.  No treatment: You can choose not to proceed with the proposed treatment alternative. The "PRO(s)" would include: avoiding the risk of complications associated with the therapy. The "CON(s)" would include: not getting any of the treatment benefits. These benefits fall under one of three categories: diagnostic; therapeutic; and/or  palliative. Diagnostic benefits include: getting information which can ultimately lead to  improvement of the disease or symptom(s). Therapeutic benefits are those associated with the successful treatment of the disease. Finally, palliative benefits are those related to the decrease of the primary symptoms, without necessarily curing the condition (example: decreasing the pain from a flare-up of a chronic condition, such as incurable terminal cancer).  General Risks and Complications: These are associated to most interventional treatments. They can occur alone, or in combination. They fall under one of the following six (6) categories: no benefit or worsening of symptoms; bleeding; infection; nerve damage; allergic reactions; and/or death. No benefits or worsening of symptoms: In Medicine there are no guarantees, only probabilities. No healthcare provider can ever guarantee that a medical treatment will work, they can only state the probability that it may. Furthermore, there is always the possibility that the condition may worsen, either directly, or indirectly, as a consequence of the treatment. Bleeding: This is more common if the patient is taking a blood thinner, either prescription or over the counter (example: Goody Powders, Fish oil, Aspirin, Garlic, etc.), or if suffering a condition associated with impaired coagulation (example: Hemophilia, cirrhosis of the liver, low platelet counts, etc.). However, even if you do not have one on these, it can still happen. If you have any of these conditions, or take one of these drugs, make sure to notify your treating physician. Infection: This is more common in patients with a compromised immune system, either due to disease (example: diabetes, cancer, human immunodeficiency virus [HIV], etc.), or due to medications or treatments (example: therapies used to treat cancer and rheumatological diseases). However, even if you do not have one on these, it can still happen. If you have any of these conditions, or take one of these drugs, make sure to notify  your treating physician. Nerve Damage: This is more common when the treatment is an invasive one, but it can also happen with the use of medications, such as those used in the treatment of cancer. The damage can occur to small secondary nerves, or to large primary ones, such as those in the spinal cord and brain. This damage may be temporary or permanent and it may lead to impairments that can range from temporary numbness to permanent paralysis and/or brain death. Allergic Reactions: Any time a substance or material comes in contact with our body, there is the possibility of an allergic reaction. These can range from a mild skin rash (contact dermatitis) to a severe systemic reaction (anaphylactic reaction), which can result in death. Death: In general, any medical intervention can result in death, most of the time due to an unforeseen complication. ______________________________________________________________________      ______________________________________________________________________    Blood Thinners  IMPORTANT NOTICE:  If you take any of these, make sure to notify the nursing staff.  Failure to do so may result in serious injury.  Recommended time intervals to stop and restart blood-thinners, before & after invasive procedures  Generic Name Brand Name Pre-procedure: Stop medication for this amount of time before your procedure: Post-procedure: Wait this amount of time after the procedure before restarting your medication:  Abciximab Reopro 15 days 2 hrs  Alteplase Activase 10 days 10 days  Anagrelide Agrylin    Apixaban Eliquis 3 days 6 hrs  Cilostazol Pletal 3 days 5 hrs  Clopidogrel Plavix 7-10 days 2 hrs  Dabigatran Pradaxa 5 days 6 hrs  Dalteparin Fragmin 24 hours 4 hrs  Dipyridamole Aggrenox 11days 2  hrs  Edoxaban [place] 3 days 2 hrs  Enoxaparin  Lovenox 24 hours 4 hrs  Eptifibatide Integrillin 8 hours 2 hrs  Fondaparinux  Arixtra 72 hours 12 hrs   Hydroxychloroquine Plaquenil 11 days   Prasugrel Effient 7-10 days 6 hrs  Reteplase Retavase 10 days 10 days  Rivaroxaban Xarelto 3 days 6 hrs  Ticagrelor Brilinta 5-7 days 6 hrs  Ticlopidine Ticlid 10-14 days 2 hrs  Tinzaparin Innohep 24 hours 4 hrs  Tirofiban Aggrastat 8 hours 2 hrs  Warfarin Coumadin 5 days 2 hrs   Other medications with blood-thinning effects  Product indications Generic (Brand) names Note  Cholesterol Lipitor Stop 4 days before procedure  Blood thinner (injectable) Heparin (LMW or LMWH Heparin) Stop 24 hours before procedure  Cancer Ibrutinib (Imbruvica) Stop 7 days before procedure  Malaria/Rheumatoid Hydroxychloroquine (Plaquenil) Stop 11 days before procedure  Thrombolytics  10 days before or after procedures   Over-the-counter (OTC) Products with blood-thinning effects  Product Common names Stop Time  Aspirin > 325 mg Goody Powders, Excedrin, etc. 11 days  Aspirin <= 81 mg  7 days  Fish oil  4 days  Garlic supplements  7 days  Ginkgo biloba  36 hours  Ginseng  24 hours  NSAIDs Ibuprofen, Naprosyn, etc. 3 days  Vitamin E  4 days   ______________________________________________________________________

## 2023-10-07 ENCOUNTER — Telehealth: Payer: Self-pay

## 2023-10-07 NOTE — Telephone Encounter (Signed)
Patient called to report BP 110/52 and left sided chest pain, per AA advised patient to go to ED.

## 2023-10-07 NOTE — Telephone Encounter (Signed)
Patient called back to let us know that she now feels better and BP is back in the 140s. Still advised patient to go to the ED.

## 2023-10-10 ENCOUNTER — Encounter: Payer: Self-pay | Admitting: Nurse Practitioner

## 2023-10-10 LAB — POCT GLYCOSYLATED HEMOGLOBIN (HGB A1C): Hemoglobin A1C: 5.5 % (ref 4.0–5.6)

## 2023-10-13 DIAGNOSIS — I480 Paroxysmal atrial fibrillation: Secondary | ICD-10-CM | POA: Diagnosis not present

## 2023-10-13 DIAGNOSIS — Z950 Presence of cardiac pacemaker: Secondary | ICD-10-CM | POA: Diagnosis not present

## 2023-10-14 ENCOUNTER — Ambulatory Visit: Payer: Medicare Other | Attending: Pain Medicine | Admitting: Pain Medicine

## 2023-10-14 ENCOUNTER — Encounter: Payer: Self-pay | Admitting: Pain Medicine

## 2023-10-14 VITALS — BP 117/97 | Temp 97.5°F | Resp 16 | Ht <= 58 in | Wt 185.0 lb

## 2023-10-14 DIAGNOSIS — M25512 Pain in left shoulder: Secondary | ICD-10-CM | POA: Insufficient documentation

## 2023-10-14 DIAGNOSIS — M549 Dorsalgia, unspecified: Secondary | ICD-10-CM | POA: Diagnosis not present

## 2023-10-14 DIAGNOSIS — M25519 Pain in unspecified shoulder: Secondary | ICD-10-CM | POA: Diagnosis not present

## 2023-10-14 DIAGNOSIS — J449 Chronic obstructive pulmonary disease, unspecified: Secondary | ICD-10-CM | POA: Diagnosis not present

## 2023-10-14 DIAGNOSIS — Z7901 Long term (current) use of anticoagulants: Secondary | ICD-10-CM | POA: Insufficient documentation

## 2023-10-14 DIAGNOSIS — M542 Cervicalgia: Secondary | ICD-10-CM | POA: Insufficient documentation

## 2023-10-14 DIAGNOSIS — R6889 Other general symptoms and signs: Secondary | ICD-10-CM | POA: Diagnosis not present

## 2023-10-14 DIAGNOSIS — M629 Disorder of muscle, unspecified: Secondary | ICD-10-CM | POA: Diagnosis not present

## 2023-10-14 DIAGNOSIS — M25511 Pain in right shoulder: Secondary | ICD-10-CM | POA: Diagnosis not present

## 2023-10-14 DIAGNOSIS — G8929 Other chronic pain: Secondary | ICD-10-CM | POA: Insufficient documentation

## 2023-10-14 DIAGNOSIS — M7918 Myalgia, other site: Secondary | ICD-10-CM | POA: Insufficient documentation

## 2023-10-14 DIAGNOSIS — M4810 Ankylosing hyperostosis [Forestier], site unspecified: Secondary | ICD-10-CM | POA: Diagnosis not present

## 2023-10-14 MED ORDER — TRIAMCINOLONE ACETONIDE 40 MG/ML IJ SUSP
40.0000 mg | Freq: Once | INTRAMUSCULAR | Status: AC
  Administered 2023-10-14: 40 mg
  Filled 2023-10-14: qty 1

## 2023-10-14 MED ORDER — ROPIVACAINE HCL 2 MG/ML IJ SOLN
4.0000 mL | Freq: Once | INTRAMUSCULAR | Status: AC
  Administered 2023-10-14: 4 mL
  Filled 2023-10-14: qty 20

## 2023-10-14 MED ORDER — LIDOCAINE HCL 2 % IJ SOLN
5.0000 mL | Freq: Once | INTRAMUSCULAR | Status: AC
  Administered 2023-10-14: 100 mg
  Filled 2023-10-14: qty 20

## 2023-10-14 MED ORDER — PENTAFLUOROPROP-TETRAFLUOROETH EX AERO
INHALATION_SPRAY | Freq: Once | CUTANEOUS | Status: AC
  Administered 2023-10-14: 30 via TOPICAL

## 2023-10-14 NOTE — Progress Notes (Signed)
 PROVIDER NOTE: Interpretation of information contained herein should be left to medically-trained personnel. Specific patient instructions are provided elsewhere under Patient Instructions section of medical record. This document was created in part using STT-dictation technology, any transcriptional errors that may result from this process are unintentional.  Patient: [name] Type: Established DOB: [birth year] MRN: [id number] PCP: Fernand Sigrid HERO, MD  Service: Procedure DOS: 10/14/2023 Setting: Ambulatory Location: Ambulatory outpatient facility Delivery: Face-to-face Provider: Eric DELENA Como, MD Specialty: Interventional Pain Management Specialty designation: 09 Location: Outpatient facility Ref. Prov.: Fernand Sigrid HERO, MD       Interventional Therapy   Type:  Trapezius and supraspinatus muscle Trigger Point Injection (Myoneural Block) (1-2 muscle groups)  #5 (w/ steroids)  CPT: 20552 Laterality: Bilateral (-50)   Imaging: N/A. Landmark-guided,           Anesthesia: Local anesthesia (1-2% Lidocaine ) Anxiolysis: None                 Sedation: No Sedation                       DOS: 10/14/2023  Performed by: Eric DELENA Como, MD  Medical Necessity (reasoning)  Purpose: Diagnostic/Therapeutic Rationale (medical necessity): procedure needed and proper for the diagnosis and/or treatment of [name]'s medical symptoms and needs. Indications: Upper back pain severe enough to impact quality of life and/or function. 1. Chronic neck pain (3ry area of Pain) (Posterior) (Bilateral) (L>R)   2. Chronic myofascial pain   3. Chronic shoulder pain (1ry area of Pain) (Bilateral) (L>R)   4. Chronic upper back pain   5. DISH (diffuse idiopathic skeletal hyperostosis)   6. Musculoskeletal disorder involving upper trapezius muscle   7. Trigger point of shoulder region, unspecified laterality   8. Trigger point with neck pain   9. Chronic anticoagulation (Eliquis )   10. Unable to  tolerate prone position due to orthopnea   11. COPD with hypoxia ([name])    NAS-11 Pain score:   Pre-procedure: 10-Worst pain ever/10   Post-procedure: 10-Worst pain ever/10       Muscle: Trapezius and supraspinatus muscle Target: Myoneural mass of trigger point. Region: Upper back & shoulder Anatomic Surface: Posterior Anatomic Area: Cervico-thoracic Level:  C7 spinous process  Approach: Percutaneous  Type of procedure: Myoneural injection   Position  Prep  Materials  Position: Sitting. Patient assisted into a comfortable position. Pressure points checked.  Prep solution: ChloraPrep (2% chlorhexidine  gluconate and 70% isopropyl alcohol) The target area was identified and the area prepped in the usual manner.  Prep Area: Entire upper back region  Materials:   Tray: Block Needle(s):  Type: Regular  Gauge (G): 22  Length: 1.5-in  Qty: 1  H&P (Pre-op Assessment):  [name] is a 78 y.o. (year old), female patient, seen today for interventional treatment. She  has a past surgical history that includes Abdominal aortic aneurysm repair (2008); Aortic valve replacement (2008); Abdominal hysterectomy; Carpal tunnel release; Tumor excision (Left); Cardiac catheterization; Cardiac catheterization (N/A, 08/17/2016); CARDIOVERSION (N/A, 08/15/2018); CARDIOVERSION (N/A, 11/14/2018); Cardioversion (N/A, 06/05/2019); Cardioversion (N/A, 08/14/2019); Cardioversion (N/A, 11/09/2019); and Cardioversion (N/A, 01/17/2020). [name] has a current medication list which includes the following prescription(s): albuterol , alprazolam , atorvastatin , breztri  aerosphere, bumetanide , calcium  carbonate-vitamin d , carvedilol , vitamin d3, coq10, diclofenac  sodium, eliquis , jardiance , levothyroxine , lisinopril , loratadine , oxycodone -acetaminophen , pantoprazole , pneumococcal 20-valent conjugate vaccine, potassium chloride , sodium chloride , trazodone , and turmeric. Her primarily concern today is the No chief complaint on  file.  Initial Vital Signs:  Pulse/HCG Rate:    Temp: (!) 97.5 F (36.4 C) Resp: 16 BP: (!) 117/97 SpO2: 97 % (O2 at 5 liters)  BMI: Estimated body mass index is 38.67 kg/m as calculated from the following:   Height as of this encounter: 4' 10 (1.473 m).   Weight as of this encounter: 185 lb (83.9 kg).  Risk Assessment: Allergies: Reviewed. She is allergic to benadryl [diphenhydramine hcl (sleep)], cetirizine, lasix  [furosemide ], levaquin [levofloxacin in d5w], meloxicam, and sulfa antibiotics.  Allergy Precautions: None required Coagulopathies: Reviewed. None identified.  Blood-thinner therapy: None at this time Active Infection(s): Reviewed. None identified. [name] is afebrile  Site Confirmation: [name] was asked to confirm the procedure and laterality before marking the site Procedure checklist: Completed Consent: Before the procedure and under the influence of no sedative(s), amnesic(s), or anxiolytics, the patient was informed of the treatment options, risks and possible complications. To fulfill our ethical and legal obligations, as recommended by the American Medical Association's Code of Ethics, I have informed the patient of my clinical impression; the nature and purpose of the treatment or procedure; the risks, benefits, and possible complications of the intervention; the alternatives, including doing nothing; the risk(s) and benefit(s) of the alternative treatment(s) or procedure(s); and the risk(s) and benefit(s) of doing nothing. The patient was provided information about the general risks and possible complications associated with the procedure. These may include, but are not limited to: failure to achieve desired goals, infection, bleeding, organ or nerve damage, allergic reactions, paralysis, and death. In addition, the patient was informed of those risks and complications associated to the procedure, such as failure to decrease pain; infection; bleeding; organ or  nerve damage with subsequent damage to sensory, motor, and/or autonomic systems, resulting in permanent pain, numbness, and/or weakness of one or several areas of the body; allergic reactions; (i.e.: anaphylactic reaction); and/or death. Furthermore, the patient was informed of those risks and complications associated with the medications. These include, but are not limited to: allergic reactions (i.e.: anaphylactic or anaphylactoid reaction(s)); adrenal axis suppression; blood sugar elevation that in diabetics may result in ketoacidosis or comma; water retention that in patients with history of congestive heart failure may result in shortness of breath, pulmonary edema, and decompensation with resultant heart failure; weight gain; swelling or edema; medication-induced neural toxicity; particulate matter embolism and blood vessel occlusion with resultant organ, and/or nervous system infarction; and/or aseptic necrosis of one or more joints. Finally, the patient was informed that Medicine is not an exact science; therefore, there is also the possibility of unforeseen or unpredictable risks and/or possible complications that may result in a catastrophic outcome. The patient indicated having understood very clearly. We have given the patient no guarantees and we have made no promises. Enough time was given to the patient to ask questions, all of which were answered to the patient's satisfaction. Ms. Flinchbaugh has indicated that she wanted to continue with the procedure. Attestation: I, the ordering provider, attest that I have discussed with the patient the benefits, risks, side-effects, alternatives, likelihood of achieving goals, and potential problems during recovery for the procedure that I have provided informed consent. Date  Time: 10/14/2023 10:36 AM   Pre-Procedure Preparation:  Monitoring: As per clinic protocol. Respiration, ETCO2, SpO2, BP, heart rate and rhythm monitor placed and checked for adequate  function Safety Precautions: Patient was assessed for positional comfort and pressure points before starting the procedure. Time-out: I initiated and conducted the Time-out before starting the procedure, as per protocol. The  patient was asked to participate by confirming the accuracy of the Time Out information. Verification of the correct person, site, and procedure were performed and confirmed by me, the nursing staff, and the patient. Time-out conducted as per Joint Commission's Universal Protocol ([email or web address].01.01.01). Time: 1155 Start Time: 1156 hrs.  Narrative                Start Time: 1156 hrs.  Standard Safety Precautions: Protocol guidelines were followed. Aspiration was conducted prior to injection. At no point did I inject any substances, as a needle was being advanced. No attempts were made at seeking a paresthesia. Safe injection practices and needle disposal techniques used. Medications properly checked for expiration dates. SDV (single dose vial) medications used.  Local Anesthesia: Skin & deeper tissues infiltrated with local anesthetic. Appropriate amount of time allowed for local anesthetics to take effect.   Technical description:  The target area was identified and the area prepped in the usual manner. The procedure needles were then advanced to the target area. Proper needle placement secured. Negative aspiration confirmed. Solution injected in intermittent fashion, asking for systemic symptoms every 0.5cc of injectate. The needles were then removed and the area cleansed, making sure to leave some of the prepping solution back to take advantage of its long term bactericidal properties.  Vitals:   10/14/23 1037  BP: (!) 117/97  Resp: 16  Temp: (!) 97.5 F (36.4 C)  TempSrc: Temporal  SpO2: 97%  Weight: 185 lb (83.9 kg)  Height: 4' 10 (1.473 m)   End Time: 1201 hrs.  Imaging Guidance                Type of Imaging Technique: None used Indication(s): N/A Exposure  Time: No patient exposure Contrast: None used. Fluoroscopic Guidance: N/A Ultrasound Guidance: N/A Interpretation: N/A  Post-operative Assessment:  Post-procedure Vital Signs:  Pulse/HCG Rate:    Temp: (!) 97.5 F (36.4 C) Resp: 16 BP: (!) 117/97 SpO2: 97 % (O2 at 5 liters)  EBL: None  Complications: No immediate post-treatment complications observed by team, or reported by patient.  Note: The patient tolerated the entire procedure well. A repeat set of vitals were taken after the procedure and the patient was kept under observation following institutional policy, for this type of procedure. Post-procedural neurological assessment was performed, showing return to baseline, prior to discharge. The patient was provided with post-procedure discharge instructions, including a section on how to identify potential problems. Should any problems arise concerning this procedure, the patient was given instructions to immediately contact us , at any time, without hesitation. In any case, we plan to contact the patient by telephone for a follow-up status report regarding this interventional procedure.  Comments:  No additional relevant information.   Plan of Care (POC)  Orders:  Orders Placed This Encounter  Procedures   TRIGGER POINT INJECTION    Scheduling Instructions:     Area: Upper Back     Side: Bilateral     Sedation: No Sedation.     Timeframe: Today    Where will this procedure be performed?:   [facility]   Informed Consent Details: Physician/Practitioner Attestation; Transcribe to consent form and obtain patient signature    Provider Attestation: I, Breeley Bischof A. Tanya, MD, (Pain Management Specialist), the physician/practitioner, attest that I have discussed with the patient the benefits, risks, side effects, alternatives, likelihood of achieving goals and potential problems during recovery for the procedure that I have provided informed consent.    Scheduling  Instructions:     Note: Always confirm laterality of pain with Ms. Corning, before procedure.     Transcribe to consent form and obtain patient signature.    Physician/Practitioner attestation of informed consent for procedure/surgical case:   I, the physician/practitioner, attest that I have discussed with the patient the benefits, risks, side effects, alternatives, likelihood of achieving goals and potential problems during recovery for the procedure that I have provided informed consent.    Procedure:   Myoneural Block (Trigger Point injection)    Physician/Practitioner performing the procedure:   Malachi Suderman A. Tanya MD    Indication/Reason:   Musculoskeletal pain/myofascial pain secondary to trigger point   Care order/instruction: Please confirm that the patient has stopped the Eliquis  (Apixaban ) x 3 days prior to procedure or surgery.    Please confirm that the patient has stopped the Eliquis  (Apixaban ) x 3 days prior to procedure or surgery.    Standing Status:   Standing    Number of Occurrences:   1   Provide equipment / supplies at bedside    Procedure tray: Block Tray (Disposable  single use) Skin infiltration needle: Regular 1.5-in, 25-G, (x1) Block Needle type: Regular Amount/quantity: 1 Size: Short(1.5-inch) Gauge: 25G    Standing Status:   Standing    Number of Occurrences:   1    Specify:   Block Tray   Chronic Opioid Analgesic:   No chronic opioid analgesics therapy prescribed by our practice. Oxycodone /APAP 10-325, 1 tab p.o. BID by Mardy Charlies Maxin, FNP-C, working for Dr. Sigrid Roise Bathe, MD. MME/day: 30 mg/day   Medications ordered for procedure: Meds ordered this encounter  Medications   pentafluoroprop-tetrafluoroeth (GEBAUERS) aerosol   triamcinolone  acetonide (KENALOG -40) injection 40 mg   ropivacaine  (PF) 2 mg/mL (0.2%) (NAROPIN ) injection 4 mL   lidocaine  (XYLOCAINE ) 2 % (with pres) injection 100 mg   Medications administered: We administered  pentafluoroprop-tetrafluoroeth, triamcinolone  acetonide, ropivacaine  (PF) 2 mg/mL (0.2%), and lidocaine .  See the medical record for exact dosing, route, and time of administration.  Follow-up plan:   Return in about 2 weeks (around 10/28/2023) for (Face2F), (PPE).       Interventional Therapies  Risk  Complexity Considerations:   Hard of hearing (HOH)  COPD  O2 dependent  positional orthopnea  Eliquis  Anticoagulation: (Stop: 3 days  Restart: 6 hours)  A-fib  prosthetic AoV (Prophylactic ampicillin  2g IV)  Cardiac pacemaker     Planned  Pending:   Palliative bilateral trapezius + supraspinatus m. MNB/TPI #5    Under consideration:   Palliative bilateral trapezius + supraspinatus m. MNB/TPI #5    Completed:   Diagnostic bilateral suprascapular NB x2 (09/25/2021) (100/100/100/100 x 2.5 months)  Therapeutic bilateral Trapezius and supraspinatus MNB x4 (06/01/2023) (100/100/100 x 27 days/80-90)    Therapeutic  Palliative (PRN) options:   Therapeutic upper back and shoulder (trapezius & supraspinatus) MNB/TPI    Pharmacotherapy  Poor candidate for any medication that may induce respiratory depression.       Recent Visits Date Type Provider Dept  10/06/23 Office Visit Tanya Glisson, MD [facility]  Showing recent visits within past 90 days and meeting all other requirements Today's Visits Date Type Provider Dept  10/14/23 Procedure visit Tanya Glisson, MD [facility]  Showing today's visits and meeting all other requirements Future Appointments Date Type Provider Dept  11/02/23 Appointment Tanya Glisson, MD [facility]  Showing future appointments within next 90 days and meeting all other requirements  Disposition: Discharge home  Discharge (Date  Time): 10/14/2023; 1203 hrs.   Primary Care Physician: Fernand Sigrid HERO, MD Location: [facility] Note by: Eric DELENA Como, MD (TTS technology  used. I apologize for any typographical errors that were not detected and corrected.) Date: 10/14/2023; Time: 1:20 PM  Disclaimer:  Medicine is not an visual merchandiser. The only guarantee in medicine is that nothing is guaranteed. It is important to note that the decision to proceed with this intervention was based on the information collected from the patient. The Data and conclusions were drawn from the patient's questionnaire, the interview, and the physical examination. Because the information was provided in large part by the patient, it cannot be guaranteed that it has not been purposely or unconsciously manipulated. Every effort has been made to obtain as much relevant data as possible for this evaluation. It is important to note that the conclusions that lead to this procedure are derived in large part from the available data. Always take into account that the treatment will also be dependent on availability of resources and existing treatment guidelines, considered by other Pain Management Practitioners as being common knowledge and practice, at the time of the intervention. For Medico-Legal purposes, it is also important to point out that variation in procedural techniques and pharmacological choices are the acceptable norm. The indications, contraindications, technique, and results of the above procedure should only be interpreted and judged by a Board-Certified Interventional Pain Specialist with extensive familiarity and expertise in the same exact procedure and technique.

## 2023-10-14 NOTE — Patient Instructions (Signed)

## 2023-10-15 ENCOUNTER — Telehealth: Payer: Self-pay

## 2023-10-15 DIAGNOSIS — F411 Generalized anxiety disorder: Secondary | ICD-10-CM

## 2023-10-15 MED ORDER — ALPRAZOLAM 0.25 MG PO TABS: 0.2500 mg | ORAL_TABLET | Freq: Two times a day (BID) | ORAL | 0 refills | Status: DC | PRN

## 2023-10-15 NOTE — Telephone Encounter (Signed)
 Called PP.No answer, left vm to call if needed.

## 2023-10-15 NOTE — Telephone Encounter (Signed)
 Asper lauren called in  xanax 

## 2023-10-15 NOTE — Telephone Encounter (Signed)
 Called in  Xanax  0.25 take 1 tab po BID #60 as per lauren

## 2023-10-18 ENCOUNTER — Ambulatory Visit (INDEPENDENT_AMBULATORY_CARE_PROVIDER_SITE_OTHER): Payer: Medicare Other | Admitting: Physician Assistant

## 2023-10-18 ENCOUNTER — Ambulatory Visit
Admission: RE | Admit: 2023-10-18 | Discharge: 2023-10-18 | Disposition: A | Discharge status: Home / Self Care | Payer: Medicare Other | Source: Ambulatory Visit | Attending: Physician Assistant | Admitting: Physician Assistant

## 2023-10-18 ENCOUNTER — Telehealth: Payer: Self-pay | Admitting: Internal Medicine

## 2023-10-18 ENCOUNTER — Telehealth: Payer: Self-pay

## 2023-10-18 ENCOUNTER — Ambulatory Visit
Admission: RE | Admit: 2023-10-18 | Discharge: 2023-10-18 | Disposition: A | Discharge status: Home / Self Care | Payer: Medicare Other | Attending: Physician Assistant | Admitting: Physician Assistant

## 2023-10-18 ENCOUNTER — Encounter: Payer: Self-pay | Admitting: Physician Assistant

## 2023-10-18 VITALS — BP 110/60 | HR 77 | Temp 98.0°F | Resp 16 | Ht <= 58 in | Wt 188.0 lb

## 2023-10-18 DIAGNOSIS — R0789 Other chest pain: Secondary | ICD-10-CM

## 2023-10-18 DIAGNOSIS — F321 Major depressive disorder, single episode, moderate: Secondary | ICD-10-CM | POA: Diagnosis not present

## 2023-10-18 DIAGNOSIS — F411 Generalized anxiety disorder: Secondary | ICD-10-CM | POA: Diagnosis not present

## 2023-10-18 DIAGNOSIS — R918 Other nonspecific abnormal finding of lung field: Secondary | ICD-10-CM | POA: Diagnosis not present

## 2023-10-18 DIAGNOSIS — M898X9 Other specified disorders of bone, unspecified site: Secondary | ICD-10-CM | POA: Diagnosis not present

## 2023-10-18 DIAGNOSIS — R61 Generalized hyperhidrosis: Secondary | ICD-10-CM

## 2023-10-18 DIAGNOSIS — R911 Solitary pulmonary nodule: Secondary | ICD-10-CM | POA: Diagnosis not present

## 2023-10-18 MED ORDER — GABAPENTIN 100 MG PO CAPS: 100.0000 mg | ORAL_CAPSULE | Freq: Every day | ORAL | 1 refills | Status: DC

## 2023-10-18 MED ORDER — SERTRALINE HCL 25 MG PO TABS: ORAL_TABLET | ORAL | 3 refills | Status: DC

## 2023-10-18 NOTE — Telephone Encounter (Signed)
 08/07/22-09/07/23 MR faxed to [facility]; [phone]

## 2023-10-18 NOTE — Progress Notes (Signed)
[facility] [address] [place], [place] [address]  Internal MEDICINE  Office Visit Note  Patient Name: [name]  [id number]  [id number]  Date of Service: 10/18/2023  Chief Complaint  Patient presents with   Acute Visit   Depression    Patient had to stop Zoloft due to cost, now increased aggression and anger   Hot Flashes   Hypertension    BP has been irregular, high in PM, low in AM   Sternum pain, increases when lifting things     HPI Pt is here for a sick visit. -Continues to have pain along sternum, ongoing for months. Worse with picking oxygen tank up. Thinks she hit it one time with tank. On chronic pain meds, but still hurts. Does not feel like other chest pain and has been chronic. Would like chest xray. Discussed follow up with cardiology as well. -hot and cold flashes, tried black cohash, but did not help. Night sweats at night impacting sleep -pharmacy would not fill sertraline, states it was being filled too soon. But that was 6 weeks ago and has been off of it completely and states she goes from angry to tearful quickly now and would like to restart. Discussed considering alternative like effexor, but she declines and would rather stick with sertraline -BP has been fluctuating some as well and will keep monitoring -some burning along right elbow/arm, no rash. Ongoing for 6 weeks.   Current Medication:  Outpatient Encounter Medications as of 10/18/2023  Medication Sig   albuterol (VENTOLIN HFA) 108 (90 Base) MCG/ACT inhaler Inhale 2 puffs into the lungs every 6 (six) hours as needed for wheezing or shortness of breath.   ALPRAZolam (XANAX) 0.25 MG tablet Take 1 tablet (0.25 mg total) by mouth 2 (two) times daily as needed for anxiety.   atorvastatin (LIPITOR) 40 MG tablet TAKE 1 TABLET BY MOUTH EVERY NIGHT AT BEDTIME   BREZTRI AEROSPHERE 160-9-4.8 MCG/ACT AERO INHALE 2 PUFFS INTO THE LUNGS TWICE DAILY   bumetanide (BUMEX) 1 MG tablet TAKE 2 TABLET  BY MOUTH TWICE DAILY, ADDITIONAL 2 TABLETS IN THE EVENING AS NEEDED   Calcium Carbonate-Vitamin D (CALCIUM 600+D PO) Take 1 tablet by mouth daily.   carvedilol (COREG) 25 MG tablet Take 25 mg by mouth 2 (two) times daily with a meal.   Cholecalciferol (VITAMIN D3) 10 MCG (400 UNIT) CAPS Take by mouth daily.   Coenzyme Q10 (COQ10) 200 MG CAPS Take 200 mg by mouth daily.   diclofenac Sodium (VOLTAREN) 1 % GEL Apply 2 g topically 4 (four) times daily.   ELIQUIS 5 MG TABS tablet TAKE 1 TABLET BY MOUTH TWICE DAILY   gabapentin (NEURONTIN) 100 MG capsule Take 1 capsule (100 mg total) by mouth at bedtime.   JARDIANCE 10 MG TABS tablet TAKE 1 TABLET(10 MG) BY MOUTH DAILY BEFORE BREAKFAST   levothyroxine (SYNTHROID) 25 MCG tablet TAKE 1 TABLET BY MOUTH DAILY BEFORE BREAKFAST   lisinopril (ZESTRIL) 40 MG tablet TAKE 1 TABLET(40 MG) BY MOUTH DAILY   loratadine (CLARITIN) 10 MG tablet Take 10 mg by mouth daily.   oxyCODONE-acetaminophen (PERCOCET) 10-325 MG tablet Take 1 tablet by mouth 2 (two) times daily as needed for pain.   pantoprazole (PROTONIX) 40 MG tablet TAKE 1 TABLET BY MOUTH EVERY DAY   pneumococcal 20-valent conjugate vaccine (PREVNAR 20) 0.5 ML injection Inject 0.5 mLs into the muscle once as needed for up to 1 dose for immunization.   potassium chloride (KLOR-CON) 10 MEQ  tablet Take 10 mEq by mouth 2 (two) times daily.   sertraline (ZOLOFT) 25 MG tablet Take 1 tablet by mouth daily for 1 week then increase to 2 tablets by mouth daily.   sodium chloride (OCEAN) 0.65 % SOLN nasal spray Place 1 spray into both nostrils as needed for congestion.   traZODone (DESYREL) 50 MG tablet TAKE 1 TABLET BY MOUTH EVERY DAY AT BEDTIME   TURMERIC PO Take by mouth daily at 6 (six) AM.   No facility-administered encounter medications on file as of 10/18/2023.      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 s/p bioprosthetic valve  replacement 2008 at [facility];  b. 01/2015 Echo: EF 60-65%, no rwma, Gr1 DD, mildly dil LA, nl RV fxn.   Aspiration pneumonia ([name])    Atrial fibrillation with RVR ([name]) 01/11/2015   Atrial flutter ([name])    a. 08/2016 s/p DCCV.  Remains on flecainide 50 mg bid.   Basal skull fracture ([name]) 20 yrs ago   CHF (congestive heart failure) ([name])    Chronic respiratory failure ([name])    COPD (chronic obstructive pulmonary disease) ([name])    a. 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 2008 prior to Aortic aneurysm repair-->nl cors.   History of stress test    a. 10/2015 MV: no ischemia/infarct.   HLD (hyperlipidemia)    HTN (hypertension)    Hypothyroidism    Obesity    PAF (paroxysmal atrial fibrillation) ([name])    a. on Eliquis; b. CHADS2VASc = 3 (HTN, age x 1, female).   Paroxysmal atrial fibrillation ([name]) 01/24/2015   Right upper quadrant abdominal tenderness without rebound tenderness 03/02/2018   S/P ascending aortic aneurysm repair 2008     Vital Signs: BP 110/60 Comment: 94/51  Pulse 77   Temp 98 F (36.7 C)   Resp 16   Ht 4\' 10"  (1.473 m)   Wt 188 lb (85.3 kg)   SpO2 94%   BMI 39.29 kg/m    Review of Systems  Constitutional:  Positive for fatigue. Negative for fever.  HENT:  Negative for congestion, mouth sores and postnasal drip.   Respiratory:  Negative for cough.   Cardiovascular:  Negative for palpitations.       Sternum pain  Endocrine: Positive for cold intolerance and heat intolerance.  Genitourinary:  Negative for flank pain.  Skin:  Negative for pallor and rash.  Psychiatric/Behavioral:  Positive for dysphoric mood and sleep disturbance. Negative for suicidal ideas. The patient is nervous/anxious.     Physical Exam Vitals and nursing note reviewed.  Constitutional:      General: She is not in acute distress.    Appearance: Normal appearance. She  is obese.     Interventions: Nasal cannula in place.  HENT:     Head: Normocephalic and atraumatic.  Eyes:     Pupils: Pupils are equal, round, and reactive to light.  Cardiovascular:     Rate and Rhythm: Normal rate and regular rhythm.     Pulses: Normal pulses.     Heart sounds: Normal heart sounds. No murmur heard.    No gallop.  Pulmonary:     Effort: Pulmonary effort is normal.     Breath sounds: Normal breath  sounds. No wheezing.     Comments: Midline tenderness over sternum to palpation Chest:     Chest wall: Tenderness present.  Skin:    Capillary Refill: Capillary refill takes less than 2 seconds.  Neurological:     Mental Status: She is alert and oriented to person, place, and time.     Cranial Nerves: No cranial nerve deficit.     Coordination: Coordination normal.     Gait: Gait normal.  Psychiatric:        Thought Content: Thought content normal.        Judgment: Judgment normal.     Comments: Tearful in office       Assessment/Plan: 1. Sternum pain (Primary) Will order chest xray, if new or worsening symptoms call cardiology or go to ED - DG Chest 2 View; Future  2. GAD (generalized anxiety disorder) Will restart and titrate as needed - sertraline (ZOLOFT) 25 MG tablet; Take 1 tablet by mouth daily for 1 week then increase to 2 tablets by mouth daily.  Dispense: 30 tablet; Refill: 3  3. Depression, major, single episode, moderate ([name]) - sertraline (ZOLOFT) 25 MG tablet; Take 1 tablet by mouth daily for 1 week then increase to 2 tablets by mouth daily.  Dispense: 30 tablet; Refill: 3  4. Night sweats Initially declined medication, but called back and would like to trial gabapentin at night, advised on possible S/E especially grogginess with pain meds as well - gabapentin (NEURONTIN) 100 MG capsule; Take 1 capsule (100 mg total) by mouth at bedtime.  Dispense: 30 capsule; Refill: 1   General Counseling: Caterine verbalizes understanding of the findings of  todays visit and agrees with plan of treatment. I have discussed any further diagnostic evaluation that may be needed or ordered today. We also reviewed her medications today. she has been encouraged to call the office with any questions or concerns that should arise related to todays visit.    Counseling:    Orders Placed This Encounter  Procedures   DG Chest 2 View    Meds ordered this encounter  Medications   sertraline (ZOLOFT) 25 MG tablet    Sig: Take 1 tablet by mouth daily for 1 week then increase to 2 tablets by mouth daily.    Dispense:  30 tablet    Refill:  3   gabapentin (NEURONTIN) 100 MG capsule    Sig: Take 1 capsule (100 mg total) by mouth at bedtime.    Dispense:  30 capsule    Refill:  1    Time spent:30 Minutes

## 2023-10-18 NOTE — Telephone Encounter (Signed)
 Pt advised that we sent gabapentin  for nighttime, about side effects of grogginess and make drowsy

## 2023-10-19 ENCOUNTER — Telehealth: Payer: Self-pay | Admitting: Internal Medicine

## 2023-10-19 NOTE — Telephone Encounter (Signed)
Received request from [facility] for office notes pertaining to non-invasive vent use. To be discussed during 10/26/23 pulm visit-Toni

## 2023-10-19 NOTE — Telephone Encounter (Signed)
ERROR

## 2023-10-23 ENCOUNTER — Other Ambulatory Visit: Payer: Self-pay | Admitting: Nurse Practitioner

## 2023-10-25 NOTE — Telephone Encounter (Signed)
Please review send

## 2023-10-26 ENCOUNTER — Ambulatory Visit: Payer: Medicare Other | Admitting: Internal Medicine

## 2023-10-26 ENCOUNTER — Encounter: Payer: Self-pay | Admitting: Internal Medicine

## 2023-10-26 VITALS — BP 121/76 | HR 72 | Temp 97.8°F | Resp 16 | Ht <= 58 in | Wt 189.0 lb

## 2023-10-26 DIAGNOSIS — J9611 Chronic respiratory failure with hypoxia: Secondary | ICD-10-CM | POA: Diagnosis not present

## 2023-10-26 DIAGNOSIS — J449 Chronic obstructive pulmonary disease, unspecified: Secondary | ICD-10-CM | POA: Diagnosis not present

## 2023-10-26 DIAGNOSIS — R0602 Shortness of breath: Secondary | ICD-10-CM

## 2023-10-26 DIAGNOSIS — Z9981 Dependence on supplemental oxygen: Secondary | ICD-10-CM | POA: Diagnosis not present

## 2023-10-26 NOTE — Patient Instructions (Signed)
Sleep Apnea  Sleep apnea is a condition that affects your breathing while you are sleeping. Your tongue or soft tissue in your throat may block the flow of air while you sleep. You may have shallow breathing or stop breathing for short periods of time. People with sleep apnea may snore loudly. There are three kinds of sleep apnea: Obstructive sleep apnea. This kind is caused by a blocked or collapsed airway. This is the most common. Central sleep apnea. This kind happens when the part of the brain that controls breathing does not send the correct signals to the muscles that control breathing. Mixed sleep apnea. This is a combination of obstructive and central sleep apnea. What are the causes? The most common cause of sleep apnea is a collapsed or blocked airway. What increases the risk? Being very overweight. Having family members with sleep apnea. Having a tongue or tonsils that are larger than normal. Having a small airway or jaw problems. Being older. What are the signs or symptoms? Loud snoring. Restless sleep. Trouble staying asleep. Being sleepy or tired during the day. Waking up gasping or choking. Having a headache in the morning. Mood swings. Having a hard time remembering things and concentrating. How is this diagnosed? A medical history. A physical exam. A sleep study. This is also called a polysomnography test. This test is done at a sleep lab or in your home while you are sleeping. How is this treated? Treatment may include: Sleeping on your side. Losing weight if you're overweight. Wearing an oral appliance. This is a mouthpiece that moves your lower jaw forward. Using a positive airway pressure (PAP) device to keep your airways open while you sleep, such as: A continuous positive airway pressure (CPAP) device. This device gives forced air through a mask when you breathe out. This keeps your airways open. A bilevel positive airway pressure (BIPAP) device. This device  gives forced air through a mask when you breathe in and when you breathe out to keep your airways open. Having surgery if other treatments do not work. If your sleep apnea is not treated, you may be at risk for: Heart failure. Heart attack. Stroke. Type 2 diabetes or a problem with your blood sugar called insulin resistance. Follow these instructions at home: Medicines Take your medicines only as told by your health care provider. Avoid alcohol, medicines to help you relax, and certain pain medicines. These may make sleep apnea worse. General instructions Do not smoke, vape, or use products with nicotine or tobacco in them. If you need help quitting, talk with your provider. If you were given a PAP device to open your airway while you sleep, use it as told by your provider. If you're having surgery, make sure to tell your provider you have sleep apnea. You may need to bring your PAP device with you. Contact a health care provider if: The PAP device that you were given to use during sleep bothers you or does not seem to be working. You do not feel better or you feel worse. Get help right away if: You have trouble breathing. You have chest pain. You have trouble talking. One side of your body feels weak. A part of your face is hanging down. These symptoms may be an emergency. Call 911 right away. Do not wait to see if the symptoms will go away. Do not drive yourself to the hospital. This information is not intended to replace advice given to you by your health care provider. Make sure  you discuss any questions you have with your health care provider. Document Revised: 05/27/2023 Document Reviewed: 10/29/2022 Elsevier Patient Education  2024 [employer]Chronic Obstructive Pulmonary Disease  Chronic obstructive pulmonary disease (COPD) is a long-term (chronic) lung problem. When you have COPD, it can feel harder to breathe in or out. The condition may get worse over time. There are  things you can do to keep yourself as healthy as possible. What are the causes? Smoking. This is the most common cause. Breathing in fumes, smoke, or chemicals for a long time. Genes that are inherited, which means they are passed down from parent to child. What are the signs or symptoms? Shortness of breath. This may happen all the time. This may get worse when you move your body. This may get worse over time. You may have times when this becomes much worse all of a sudden. These are called flare-ups or exacerbations. A long-term cough, with or without thick mucus. Wheezing. Chest tightness. Feeling tired. Not being able to do activities like you used to do. How is this diagnosed? This condition is diagnosed based on: Your medical history. A physical exam. Lung (pulmonary) function tests. You may have a test that measures the air flow out of the lungs when you breathe out. You may also have tests, including: Chest X-ray. CT scan. Blood tests. How is this treated? This condition may be treated by: Quitting smoking, if you smoke. Using oxygen. Taking medicines. These may include: Inhalers. These have medicines in them that you breathe in. Daily inhalers. These help to prevent symptoms from happening. They are usually taken every day to prevent COPD flare-ups. Quick relief inhalers. These act fast to relieve symptoms. They are used only when needed and provide short-term relief. Other medicines that you breathe in or swallow. These may be used to open the airways, thin mucus, or treat infections. Breathing exercises to help you control or catch your breath. A mucus clearing device, if you have a lot of thick mucus. Pulmonary rehab. A place where you will learn about your condition and the best ways for you to manage it. Surgery. Follow these instructions at home: Medicines Take your medicines as told by your health care provider. Talk to your provider before taking any cough or  allergy medicines. You may need to avoid medicines that cause your lungs to be dry. Lifestyle Several times a day, wash your hands with soap and water for at least 20 seconds. If you cannot use soap and water, use hand sanitizer. This may help keep you from getting an infection. Avoid being around crowds or people who are sick. Do not smoke or use any products that contain nicotine or tobacco. If you need help quitting, ask your provider. Stay active. Learn how to pace your activity during the day. Learn how to breathe to control your stress and catch your breath. Drink enough fluid to keep your pee (urine) pale yellow, unless you have been told not to. Eat healthy foods. Eat smaller meals more often. Get enough sleep. Most adults need 7 or more hours per night. General instructions Make a COPD action plan with your provider. This helps you to know what to do if you feel worse than usual. Make sure you get all the shots, also called vaccines, that your provider recommends. Ask your provider about a flu shot and a pneumonia shot. If you need home oxygen therapy, ask your provider how often to check your oxygen level with a device called  an oximeter. Keep all follow-up visits to review your COPD action plan. Your provider will want to check on your condition often to keep you healthy and out of the hospital. Contact a health care provider if: You are coughing up more mucus than usual. There is a change in the color or thickness of the mucus. It is harder to breathe than usual or you are short of breath while you are resting. You need to use your quick relief inhaler more often. You have trouble doing your normal activities such as getting dressed or walking in the house. Your skin color or fingernails turn blue. You have a fever or chills. Get help right away if: You are short of breath and cannot: Talk in full sentences. Do normal activities. You have chest pain. You feel  confused. These symptoms may be an emergency. Call 911 right away. Do not wait to see if the symptoms will go away. Do not drive yourself to the hospital. This information is not intended to replace advice given to you by your health care provider. Make sure you discuss any questions you have with your health care provider. Document Revised: 05/27/2023 Document Reviewed: 11/09/2022 Elsevier Patient Education  2024 [employer].

## 2023-10-26 NOTE — Progress Notes (Signed)
 [facility] [address] [place], [place] [address]  Pulmonary Sleep Medicine   Office Visit Note  Patient Name: [name] DOB: Aug 12, 1946 MRN [id number]  Date of Service: 10/26/2023  Complaints/HPI: She is doing OK overall. She states her breathing has not improved. She notes taht she desaturates easily. She has no chest pain no cough noted. Her cancer is being followed by oncology. No recent flareups of the COPD. She is using the oxygen as prescribed  Office Spirometry Results: Peak Flow: (!) 3 L/min FEV1: 1.38 liters FVC: 1.85 liters FEV1/FVC: 74.6 % FVC  % Predicted: 89 % FEV % Predicted: 90 % FeF 25-75: 1.1 liters FeF 25-75 % Predicted: 86   ROS  General: (-) fever, (-) chills, (-) night sweats, (-) weakness Skin: (-) rashes, (-) itching,. Eyes: (-) visual changes, (-) redness, (-) itching. Nose and Sinuses: (-) nasal stuffiness or itchiness, (-) postnasal drip, (-) nosebleeds, (-) sinus trouble. Mouth and Throat: (-) sore throat, (-) hoarseness. Neck: (-) swollen glands, (-) enlarged thyroid, (-) neck pain. Respiratory: - cough, (-) bloody sputum, + shortness of breath, - wheezing. Cardiovascular: - ankle swelling, (-) chest pain. Lymphatic: (-) lymph node enlargement. Neurologic: (-) numbness, (-) tingling. Psychiatric: (-) anxiety, (-) depression   Current Medication: Outpatient Encounter Medications as of 10/26/2023  Medication Sig   albuterol (VENTOLIN HFA) 108 (90 Base) MCG/ACT inhaler Inhale 2 puffs into the lungs every 6 (six) hours as needed for wheezing or shortness of breath.   ALPRAZolam (XANAX) 0.25 MG tablet Take 1 tablet (0.25 mg total) by mouth 2 (two) times daily as needed for anxiety.   atorvastatin (LIPITOR) 40 MG tablet TAKE 1 TABLET BY MOUTH EVERY NIGHT AT BEDTIME   BREZTRI AEROSPHERE 160-9-4.8 MCG/ACT AERO INHALE 2 PUFFS INTO THE LUNGS TWICE DAILY   bumetanide (BUMEX) 1 MG tablet TAKE 2 TABLET BY MOUTH TWICE DAILY, ADDITIONAL  2 TABLETS IN THE EVENING AS NEEDED   Calcium Carbonate-Vitamin D (CALCIUM 600+D PO) Take 1 tablet by mouth daily.   carvedilol (COREG) 25 MG tablet Take 25 mg by mouth 2 (two) times daily with a meal.   Cholecalciferol (VITAMIN D3) 10 MCG (400 UNIT) CAPS Take by mouth daily.   Coenzyme Q10 (COQ10) 200 MG CAPS Take 200 mg by mouth daily.   diclofenac Sodium (VOLTAREN) 1 % GEL Apply 2 g topically 4 (four) times daily.   ELIQUIS 5 MG TABS tablet TAKE 1 TABLET BY MOUTH TWICE DAILY   gabapentin (NEURONTIN) 100 MG capsule Take 1 capsule (100 mg total) by mouth at bedtime.   JARDIANCE 10 MG TABS tablet TAKE 1 TABLET(10 MG) BY MOUTH DAILY BEFORE BREAKFAST   levothyroxine (SYNTHROID) 25 MCG tablet TAKE 1 TABLET BY MOUTH DAILY BEFORE BREAKFAST   lisinopril (ZESTRIL) 40 MG tablet TAKE 1 TABLET(40 MG) BY MOUTH DAILY   loratadine (CLARITIN) 10 MG tablet Take 10 mg by mouth daily.   oxyCODONE-acetaminophen (PERCOCET) 10-325 MG tablet Take 1 tablet by mouth 2 (two) times daily as needed for pain.   pantoprazole (PROTONIX) 40 MG tablet TAKE 1 TABLET BY MOUTH EVERY DAY   pneumococcal 20-valent conjugate vaccine (PREVNAR 20) 0.5 ML injection Inject 0.5 mLs into the muscle once as needed for up to 1 dose for immunization.   potassium chloride (KLOR-CON) 10 MEQ tablet TAKE 4 TABLETS BY MOUTH TWICE DAILY   sertraline (ZOLOFT) 25 MG tablet Take 1 tablet by mouth daily for 1 week then increase to 2 tablets by mouth daily.  sodium chloride (OCEAN) 0.65 % SOLN nasal spray Place 1 spray into both nostrils as needed for congestion.   traZODone (DESYREL) 50 MG tablet TAKE 1 TABLET BY MOUTH EVERY DAY AT BEDTIME   TURMERIC PO Take by mouth daily at 6 (six) AM.   No facility-administered encounter medications on file as of 10/26/2023.    Surgical History: Past Surgical History:  Procedure Laterality Date   ABDOMINAL AORTIC ANEURYSM REPAIR  2008   ABDOMINAL HYSTERECTOMY     AORTIC VALVE REPLACEMENT  2008   CARDIAC  CATHETERIZATION     [facility]   CARDIOVERSION N/A 08/15/2018   Procedure: CARDIOVERSION;  Surgeon: Iran Ouch, MD;  Location: [facility] ORS;  Service: Cardiovascular;  Laterality: N/A;   CARDIOVERSION N/A 11/14/2018   Procedure: CARDIOVERSION (CATH LAB);  Surgeon: Iran Ouch, MD;  Location: [facility] ORS;  Service: Cardiovascular;  Laterality: N/A;   CARDIOVERSION N/A 06/05/2019   Procedure: CARDIOVERSION;  Surgeon: Iran Ouch, MD;  Location: [facility] ORS;  Service: Cardiovascular;  Laterality: N/A;   CARDIOVERSION N/A 08/14/2019   Procedure: CARDIOVERSION;  Surgeon: Iran Ouch, MD;  Location: [facility] ORS;  Service: Cardiovascular;  Laterality: N/A;   CARDIOVERSION N/A 11/09/2019   Procedure: CARDIOVERSION;  Surgeon: Marcina Millard, MD;  Location: [facility] ORS;  Service: Cardiovascular;  Laterality: N/A;   CARDIOVERSION N/A 01/17/2020   Procedure: CARDIOVERSION;  Surgeon: Marcina Millard, MD;  Location: [facility] ORS;  Service: Cardiovascular;  Laterality: N/A;   CARPAL TUNNEL RELEASE     ELECTROPHYSIOLOGIC STUDY N/A 08/17/2016   Procedure: Cardioversion;  Surgeon: Iran Ouch, MD;  Location: [facility] ORS;  Service: Cardiovascular;  Laterality: N/A;   TUMOR EXCISION Left    x3 (arm)    Medical History: Past Medical History:  Diagnosis Date   Aortic 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 s/p bioprosthetic valve replacement 2008 at [facility];  b. 01/2015 Echo: EF 60-65%, no rwma, Gr1 DD, mildly dil LA, nl RV fxn.   Aspiration pneumonia ([name])    Atrial fibrillation with RVR ([name]) 01/11/2015   Atrial flutter ([name])    a. 08/2016 s/p DCCV.  Remains on flecainide 50 mg bid.   Basal skull fracture ([name]) 20 yrs ago   CHF (congestive heart failure) ([name])    Chronic respiratory failure ([name])    COPD (chronic obstructive pulmonary disease) ([name])    a. 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 2008 prior to Aortic aneurysm repair-->nl cors.   History of stress test    a. 10/2015 MV: no ischemia/infarct.   HLD (hyperlipidemia)    HTN (hypertension)    Hypothyroidism    Obesity    PAF (paroxysmal atrial fibrillation) ([name])    a. on Eliquis; b. CHADS2VASc = 3 (HTN, age x 1, female).   Paroxysmal atrial fibrillation ([name]) 01/24/2015   Right upper quadrant abdominal tenderness without rebound tenderness 03/02/2018   S/P ascending aortic aneurysm repair 2008    Family History: Family History  Problem Relation Age of Onset   Stroke Father    Stroke Paternal Grandmother    Breast cancer Neg Hx     Social History: Social History   Socioeconomic History   Marital status: Divorced    Spouse name: Not on file   Number of children: Not on file  Years of education: Not on file   Highest education level: Not on file  Occupational History   Not on file  Tobacco Use   Smoking status: Former    Types: Cigarettes   Smokeless tobacco: Never  Vaping Use   Vaping status: Never Used  Substance and Sexual Activity   Alcohol use: No   Drug use: No   Sexual activity: Never    Birth control/protection: Surgical  Other Topics Concern   Not on file  Social History Narrative   Not on file   Social Drivers of Health   Financial Resource Strain: Low Risk  (11/03/2022)   Received from [facility], [facility]   Overall Financial Resource Strain (CARDIA)    Difficulty of Paying Living Expenses: Not hard at all  Recent Concern: Financial Resource Strain - Medium Risk (11/02/2022)   Received from [facility]   Overall Financial Resource Strain (CARDIA)    Difficulty of Paying Living Expenses: Somewhat hard  Food Insecurity: Food Insecurity Present (04/02/2023)   Hunger Vital Sign    Worried About Running Out of Food in the Last Year: Sometimes true    Ran Out of Food in the Last Year: Sometimes true   Transportation Needs: No Transportation Needs (02/02/2023)   PRAPARE - Administrator, Civil Service (Medical): No    Lack of Transportation (Non-Medical): No  Physical Activity: Not on file  Stress: Not on file  Social Connections: Not on file  Intimate Partner Violence: Not At Risk (04/02/2023)   Humiliation, Afraid, Rape, and Kick questionnaire    Fear of Current or Ex-Partner: No    Emotionally Abused: No    Physically Abused: No    Sexually Abused: No    Vital Signs: Blood pressure 121/76, pulse 72, temperature 97.8 F (36.6 C), resp. rate 16, height 4\' 10"  (1.473 m), weight 189 lb (85.7 kg), SpO2 90%, peak flow (!) 3 L/min.  Examination: General Appearance: The patient is well-developed, well-nourished, and in no distress. Skin: Gross inspection of skin unremarkable. Head: normocephalic, no gross deformities. Eyes: no gross deformities noted. ENT: ears appear grossly normal no exudates. Neck: Supple. No thyromegaly. No LAD. Respiratory: no rhonchi noted at this time. Cardiovascular: Normal S1 and S2 without murmur or rub. Extremities: No cyanosis. pulses are equal. Neurologic: Alert and oriented. No involuntary movements.  LABS: Recent Results (from the past 2160 hours)  POCT glycosylated hemoglobin (Hb A1C)     Status: Normal   Collection Time: 08/26/23  3:00 PM  Result Value Ref Range   Hemoglobin A1C 5.5 4.0 - 5.6 %   HbA1c POC (<> result, manual entry)     HbA1c, POC (prediabetic range)     HbA1c, POC (controlled diabetic range)      Radiology: No results found.  No results found.  No results found.  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   CHF (congestive heart failure), NYHA class IV, chronic, diastolic ([name]) 10/22/2022   On home oxygen  therapy 10/22/2022   Hypokalemia 08/04/2022   Chronic upper back pain 04/14/2022   Chronic myofascial pain 04/14/2022   Musculoskeletal disorder involving upper trapezius muscle 04/14/2022   Abnormal CT scan, cervical spine (03/04/2022) 04/01/2022   Trigger point of shoulder region (Bilateral) 04/01/2022   Trigger point with neck pain 04/01/2022   DISH (diffuse idiopathic skeletal hyperostosis) 03/12/2022   Atrial fibrillation, chronic ([name]) 01/26/2022   Morbid obesity ([name]) 01/26/2022   Chronic respiratory failure with hypoxia ([name]) 01/26/2022   Bilateral pneumonia 01/25/2022   Chronic intractable headache 01/07/2022   Chronic diastolic CHF (congestive heart failure) ([name]) 12/02/2021   Hyperlipidemia 12/02/2021   Stroke ([name]) 12/02/2021   Iron deficiency anemia 12/02/2021   Depression 12/02/2021   Acute on chronic respiratory failure with hypoxia ([name]) 11/11/2021   Unable to tolerate prone position due to orthopnea 10/16/2021   Orthopnea (Positional) 10/16/2021   Class 2 obesity with alveolar hypoventilation, serious comorbidity, and body mass index (BMI) of 39.0 to 39.9 in adult ([name]) 10/16/2021   At high risk for postoperative complications 10/16/2021   Hearing loss 08/12/2021   Long term prescription benzodiazepine use 04/30/2021   Chronic shoulder pain (1ry area of Pain) (Bilateral) (L>R) 04/30/2021   Chronic upper extremity pain (2ry area of Pain) (Bilateral) (L>R) 04/30/2021   Cervicalgia 04/30/2021   Chronic neck pain (3ry area of Pain) (Posterior) (Bilateral) (L>R) 04/30/2021   Shoulder blade pain (4th area of Pain) (Left) 04/30/2021   Osteoarthritis of glenohumeral joint (Left) 04/30/2021   Osteoarthritis of AC  (acromioclavicular) joint (Left) 04/30/2021   Osteoarthritis of glenohumeral joints (Bilateral) 04/30/2021   Osteoarthritis of acromioclavicular joints (Bilateral) 04/30/2021   Primary osteoarthritis of shoulders (Bilateral) 04/30/2021   DDD (degenerative disc disease), cervical 04/30/2021   Cervical radiculitis (Left) 04/30/2021   Cervical radiculopathy (Left) 04/30/2021   C6 radiculopathy (Left) 04/30/2021   C7 radiculopathy (Left) 04/30/2021   Wheelchair dependence 04/30/2021   Chronic pain syndrome 04/29/2021   Pharmacologic therapy 04/29/2021   Disorder of skeletal system 04/29/2021   Problems influencing health status 04/29/2021   Chronic anticoagulation (Eliquis) 03/14/2021   Hx of atrioventricular node ablation 03/14/2021   Symptomatic anemia 01/29/2021   Heart failure due to valvular disease, acute on chronic, diastolic ([name]) 05/11/2020   Chronic obstructive pulmonary disease (COPD) ([name])    Hypothyroidism    Cardiac pacemaker in situ 05/10/2020   Acute non-recurrent frontal sinusitis 04/22/2020   Vertigo 04/22/2020   S/P placement of cardiac pacemaker 02/21/2020   Atrial fibrillation status post cardioversion ([name]) 11/16/2019   Episode of moderate major depression ([name]) 10/10/2019   Persistent atrial fibrillation ([name])    Encounter for general adult medical examination with abnormal findings 07/10/2019   Encounter for screening mammogram for malignant neoplasm of breast 07/10/2019   Atopic dermatitis 05/02/2019   Paroxysmal atrial flutter ([name]) 08/11/2018   Encounter for long-term (current) use of medications 07/09/2018   Chronic shoulder pain (Left) 07/09/2018   Conjunctivitis 07/09/2018   Oxygen dependent 07/09/2018   GAD (generalized anxiety disorder) 07/09/2018   Ovarian failure 07/09/2018   Positive colorectal cancer screening using Cologuard test 04/24/2018   Calculus of gallbladder with acute on chronic cholecystitis 04/08/2018   H/O: CVA (cerebrovascular  accident) 04/08/2018   Dysuria 03/02/2018   Right upper quadrant abdominal tenderness without rebound tenderness 03/02/2018   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 exacerbation ([name]) 03/02/2018   Chronic obstructive pulmonary disease ([name]) 09/19/2017   Typical atrial flutter ([name])    Irritable bowel syndrome without diarrhea 01/29/2015  Hypertension 01/24/2015   Paroxysmal atrial fibrillation ([name]) 01/24/2015   History of aortic valve replacement with bioprosthetic valve 01/24/2015   Atrial fibrillation with RVR ([name]) 01/11/2015   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 05/18/2014   Shoulder impingement syndrome (Left) 05/18/2014   Cellulitis and abscess 02/13/2014   MRSA (methicillin resistant Staphylococcus aureus) 02/13/2014   Aneurysm, ascending aorta ([name]) 06/23/2013   Bicuspid aortic valve 06/23/2013    .1. SOB (shortness of breath) (Primary) She is at baseline spiro done and reviewed with the patient today. Continue with her current medical management - Spirometry with graph  Patient chronic respiratory failure due to COPD. Patient has had an ABG documenting high pCO2 level and has had PFT revealing severe obstructive ventilatory defect. Based on these findings patient would benefit from NON-INVASIVE VENTILATION. Without NON-INVASIVE VENTILATION patient is at high risk of continued decline with worsening of respiratory failure as well as worsening of pCO2 and risk for frequent hospitalizations. A BiLEVEL device will be inadequate to support the nocturnal ventilation needs and control the patients symptoms. The NON-INVASIVE VENTILATOR is the best modality for this patient with COPD and respiratory failure and may aid in the patients exercise tolerance as well as help to reduce frequency of exacerbations. The patient is able to clear their own airway of secretions and protect the airway. The NON-INVASIVE VENTILATOR may also help with reducing risk for aspiration and  also reducing the need for long term mechanical ventilation due to respiratory failure. Patient can clear and protect highway.  2. Chronic respiratory failure with hypoxia ([name]) On oxygen therapy which will be continued  3. Chronic obstructive pulmonary disease, unspecified COPD type ([name]) On inhalers appears to be well controlled  4. Oxygen dependent Titrate as needed   General Counseling: I have discussed the findings of the evaluation and examination with [name].  I have also discussed any further diagnostic evaluation thatmay be needed or ordered today. [name] verbalizes understanding of the findings of todays visit. We also reviewed her medications today and discussed drug interactions and side effects including but not limited excessive drowsiness and altered mental states. We also discussed that there is always a risk not just to her but also people around her. she has been encouraged to call the office with any questions or concerns that should arise related to todays visit.  Orders Placed This Encounter  Procedures   Spirometry with graph    Where should this test be performed?:   [facility]     Time spent: 58  I have personally obtained a history, examined the patient, evaluated laboratory and imaging results, formulated the assessment and plan and placed orders.    Yevonne Pax, MD [facility] Pulmonary and Critical Care Sleep medicine

## 2023-10-28 ENCOUNTER — Ambulatory Visit: Payer: Medicare Other | Admitting: Nurse Practitioner

## 2023-11-01 ENCOUNTER — Telehealth: Payer: Self-pay

## 2023-11-01 DIAGNOSIS — G8929 Other chronic pain: Secondary | ICD-10-CM

## 2023-11-01 MED ORDER — OXYCODONE-ACETAMINOPHEN 10-325 MG PO TABS: 1.0000 | ORAL_TABLET | Freq: Two times a day (BID) | ORAL | 0 refills | Status: DC | PRN

## 2023-11-01 NOTE — Telephone Encounter (Signed)
-----   Message from Carlean Jews sent at 11/01/2023 12:25 PM EST ----- No acute findings on chest xray

## 2023-11-01 NOTE — Progress Notes (Unsigned)
 PROVIDER NOTE: Information contained herein reflects review and annotations entered in association with encounter. Interpretation of such information and data should be left to medically-trained personnel. Information provided to patient can be located elsewhere in the medical record under "Patient Instructions". Document created using STT-dictation technology, any transcriptional errors that may result from process are unintentional.    Patient: [name]  Service Category: E/M  Provider: Oswaldo Done, MD  DOB: 27-May-1946  DOS: 11/02/2023  Referring Provider: Lyndon Code, MD  MRN: [id number]  Specialty: Interventional Pain Management  PCP: Lyndon Code, MD  Type: Established Patient  Setting: Ambulatory outpatient    Location: Office  Delivery: Face-to-face     HPI  [name], a 78 y.o. year old female, is here today because of her Chronic neck pain [M54.2, G89.29]. 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; Shoulder impingement syndrome (Left); Chronic shoulder pain (Left); Chronic pain syndrome; Chronic shoulder pain (1ry area of Pain) (Bilateral) (L>R); Chronic upper extremity pain (2ry area of Pain) (Bilateral) (L>R); Cervicalgia; Chronic neck pain (3ry area of Pain) (Posterior) (Bilateral) (L>R); Shoulder blade pain (4th area of Pain) (Left); Osteoarthritis of glenohumeral joint (Left); Osteoarthritis of AC (acromioclavicular) joint (Left); Osteoarthritis of glenohumeral joints (Bilateral); Osteoarthritis of acromioclavicular joints (Bilateral); Primary osteoarthritis of shoulders (Bilateral); DDD (degenerative disc disease), cervical; Cervical radiculitis (Left); Cervical radiculopathy (Left); C6 radiculopathy (Left); C7 radiculopathy (Left); Unable to tolerate prone position due to orthopnea; Chronic intractable  headache; DISH (diffuse idiopathic skeletal hyperostosis); Abnormal CT scan, cervical spine (03/04/2022); Trigger point of shoulder region (Bilateral); Trigger point with neck pain; Chronic upper back pain; Chronic myofascial pain; and Musculoskeletal disorder involving upper trapezius muscle on their pertinent problem list. Pain Assessment: Severity of Chronic pain is reported as a 6 /10. Location: Shoulder Right, Left/radiates into both arms and hands. Onset: More than a month ago. Quality: Aching, Constant, Sharp. Timing: Constant. Modifying factor(s): nothing. Vitals:  height is 4\' 10"  (1.473 m) and weight is 189 lb (85.7 kg). Her temperature is 97.3 F (36.3 C) (abnormal). Her blood pressure is 108/57 (abnormal) and her pulse is 72. Her oxygen saturation is 95%.  BMI: Estimated body mass index is 39.5 kg/m as calculated from the following:   Height as of this encounter: 4\' 10"  (1.473 m).   Weight as of this encounter: 189 lb (85.7 kg). Last encounter: 10/06/2023. Last procedure: 10/14/2023.  Reason for encounter: post-procedure evaluation and assessment.  Discussed the use of AI scribe software for clinical note transcription with the patient, who gave verbal consent to proceed.  History of Present Illness   [name] is a 78 year old female who presents with recurrence of pain post-procedure.  She experienced a 75% improvement in her pain for approximately two weeks following a recent procedure. However, the pain has started to return, similar to previous experiences where relief lasted only about two weeks. The pain began to recur this past Friday.  She has a history of heart surgery in 2008, which involved a sternotomy. The sternum did not heal properly, and she recently injured it again in October when she accidentally hit her chest. An x-ray was performed, and she was diagnosed with pneumonia at that time. The sternum area is very tender, with pain radiating to her breast, and pressing  on the area exacerbates the pain.  She has  been taking gabapentin for hot flashes, which she started almost two weeks ago. She is also on oxycodone with Tylenol, taken twice daily for pain management.  She recounts an incident where she was unable to find her antidepressant medication, which she believes was taken by her daughter during a visit. This situation caused significant distress during a previous appointment, but she is now back on her medication.  She mentions taking turmeric supplements to help with inflammation, which she learned about previously and researched on her own.      Post-procedure evaluation   Type:  Trapezius and supraspinatus muscle Trigger Point Injection (Myoneural Block) (1-2 muscle groups)  #5 (w/ steroids)  CPT: 20552 Laterality: Bilateral (-50)   Imaging: N/A. Landmark-guided",           Anesthesia: Local anesthesia (1-2% Lidocaine) Anxiolysis: None                 Sedation: No Sedation                       DOS: 10/14/2023  Performed by: Oswaldo Done, MD  Medical Necessity (reasoning)  Purpose: Diagnostic/Therapeutic Rationale (medical necessity): procedure needed and proper for the diagnosis and/or treatment of [name] medical symptoms and needs. Indications: Upper back pain severe enough to impact quality of life and/or function. 1. Chronic neck pain (3ry area of Pain) (Posterior) (Bilateral) (L>R)   2. Chronic myofascial pain   3. Chronic shoulder pain (1ry area of Pain) (Bilateral) (L>R)   4. Chronic upper back pain   5. DISH (diffuse idiopathic skeletal hyperostosis)   6. Musculoskeletal disorder involving upper trapezius muscle   7. Trigger point of shoulder region, unspecified laterality   8. Trigger point with neck pain   9. Chronic anticoagulation (Eliquis)   10. Unable to tolerate prone position due to orthopnea   11. COPD with hypoxia ([name])    NAS-11 Pain score:   Pre-procedure: 10-Worst pain ever/10   Post-procedure:  10-Worst pain ever/10      Effectiveness:  Initial hour after procedure: 75 %. Subsequent 4-6 hours post-procedure: 75 %. Analgesia past initial 6 hours: 75 % (lasting 2 weeks). Ongoing improvement:  Analgesic: The patient indicates having attained a 75% improvement of the pain for approximately 2 weeks.  Unfortunately some of the pain is beginning to come back. Function: Transient improvement ROM: Transient improvement   Pharmacotherapy Assessment  Analgesic: No chronic opioid analgesics therapy prescribed by our practice. Oxycodone/APAP 10-325, 1 tab p.o. BID by [name], FNP-C, working for Dr. Lillia Carmel, MD. MME/day: 30 mg/day   Monitoring: [place] PMP: PDMP reviewed during this encounter.       Pharmacotherapy: No side-effects or adverse reactions reported. Compliance: No problems identified. Effectiveness: Clinically acceptable.  [name], RN  11/02/2023  2:52 PM  Signed Safety precautions to be maintained throughout the outpatient stay will include: orient to surroundings, keep bed in low position, maintain call bell within reach at all times, provide assistance with transfer out of bed and ambulation.    No results found for: "CBDTHCR" No results found for: "D8THCCBX" No results found for: "D9THCCBX"  UDS:  Summary  Date Value Ref Range Status  04/30/2021 Note  Final    Comment:    ==================================================================== Compliance Drug Analysis, Ur ==================================================================== Test  Result       Flag       Units  Drug Present and Declared for Prescription Verification   Alpha-hydroxyalprazolam        107          EXPECTED   ng/mg creat    Alpha-hydroxyalprazolam is an expected metabolite of alprazolam.    Source of alprazolam is a scheduled prescription medication.    Oxycodone                      1371         EXPECTED   ng/mg creat   Oxymorphone                     600          EXPECTED   ng/mg creat   Noroxycodone                   2311         EXPECTED   ng/mg creat    Sources of oxycodone include scheduled prescription medications.    Oxymorphone and noroxycodone are expected metabolites of oxycodone.    Oxymorphone is also available as a scheduled prescription medication.    Sertraline                     PRESENT      EXPECTED   Desmethylsertraline            PRESENT      EXPECTED    Desmethylsertraline is an expected metabolite of sertraline.    Trazodone                      PRESENT      EXPECTED   1,3 chlorophenyl piperazine    PRESENT      EXPECTED    1,3-chlorophenyl piperazine is an expected metabolite of trazodone.    Acetaminophen                  PRESENT      EXPECTED  Drug Absent but Declared for Prescription Verification   Diclofenac                     Not Detected UNEXPECTED    Diclofenac, as indicated in the declared medication list, is not    always detected even when used as directed.  ==================================================================== Test                      Result    Flag   Units      Ref Range   Creatinine              45               mg/dL      >=13 ==================================================================== Declared Medications:  The flagging and interpretation on this report are based on the  following declared medications.  Unexpected results may arise from  inaccuracies in the declared medications.   **Note: The testing scope of this panel includes these medications:   Alprazolam (Xanax)  Oxycodone (Percocet)  Sertraline (Zoloft)  Trazodone (Desyrel)   **Note: The testing scope of this panel does not include small to  moderate amounts of these reported medications:   Acetaminophen (Tylenol)  Acetaminophen (Percocet)  Diclofenac (Voltaren)   **Note: The testing scope of this panel does not include the  following reported medications:  Albuterol (Ventolin  HFA)  Apixaban (Eliquis)  Atorvastatin (Lipitor)  Bumetanide (Bumex)  Calcium  Flecainide (Tambocor)  Fluticasone (Trelegy)  Iron  Levothyroxine (Synthroid)  Lisinopril (Zestril)  Loratadine (Claritin)  Pantoprazole (Protonix)  Potassium (Klor-Con)  Ubiquinone (CoQ10)  Umeclidinium (Trelegy)  Vilanterol (Trelegy)  Vitamin D ==================================================================== For clinical consultation, please call [phone]. ====================================================================       ROS  Constitutional: Denies any fever or chills Gastrointestinal: No reported hemesis, hematochezia, vomiting, or acute GI distress Musculoskeletal: Denies any acute onset joint swelling, redness, loss of ROM, or weakness Neurological: No reported episodes of acute onset apraxia, aphasia, dysarthria, agnosia, amnesia, paralysis, loss of coordination, or loss of consciousness  Medication Review  ALPRAZolam, Budeson-Glycopyrrol-Formoterol, Calcium Carbonate-Vitamin D, CoQ10, Turmeric, Vitamin D3, albuterol, apixaban, atorvastatin, bumetanide, carvedilol, diclofenac Sodium, empagliflozin, gabapentin, levothyroxine, lisinopril, loratadine, oxyCODONE-acetaminophen, pantoprazole, pneumococcal 20-valent conjugate vaccine, potassium chloride, sertraline, sodium chloride, and traZODone  History Review  Allergy: [name] is allergic to benadryl [diphenhydramine hcl (sleep)], cetirizine, lasix [furosemide], levaquin [levofloxacin in d5w], meloxicam, and sulfa antibiotics. Drug: [name]  reports no history of drug use. Alcohol:  reports no history of alcohol use. Tobacco:  reports that she has quit smoking. Her smoking use included cigarettes. She has never used smokeless tobacco. Social: [name]  reports that she has quit smoking. Her smoking use included cigarettes. She has never used smokeless tobacco. She reports that she does not drink alcohol and does not use  drugs. Medical:  has a past medical history of Aortic stenosis due to bicuspid aortic valve, Aspiration pneumonia ([name]), Atrial fibrillation with RVR ([name]) (01/11/2015), Atrial flutter ([name]), Basal skull fracture ([name]) (20 yrs ago), CHF (congestive heart failure) ([name]), Chronic respiratory failure ([name]), COPD (chronic obstructive pulmonary disease) ([name]), Deafness in left ear, Essential hypertension (01/24/2015), History of cardiac cath, History of stress test, HLD (hyperlipidemia), HTN (hypertension), Hypothyroidism, Obesity, PAF (paroxysmal atrial fibrillation) ([name]), Paroxysmal atrial fibrillation ([name]) (01/24/2015), Right upper quadrant abdominal tenderness without rebound tenderness (03/02/2018), and S/P ascending aortic aneurysm repair (2008). Surgical: [name]  has a past surgical history that includes Abdominal aortic aneurysm repair (2008); Aortic valve replacement (2008); Abdominal hysterectomy; Carpal tunnel release; Tumor excision (Left); Cardiac catheterization; Cardiac catheterization (N/A, 08/17/2016); CARDIOVERSION (N/A, 08/15/2018); CARDIOVERSION (N/A, 11/14/2018); Cardioversion (N/A, 06/05/2019); Cardioversion (N/A, 08/14/2019); Cardioversion (N/A, 11/09/2019); and Cardioversion (N/A, 01/17/2020). Family: family history includes Stroke in her father and paternal grandmother.  Laboratory Chemistry Profile   Renal Lab Results  Component Value Date   BUN 15 04/27/2023   CREATININE 0.90 07/09/2023   BCR 17 04/27/2023   GFRAA >60 05/14/2020   GFRNONAA >60 03/06/2023    Hepatic Lab Results  Component Value Date   AST 15 04/27/2023   ALT 13 04/27/2023   ALBUMIN 4.0 04/27/2023   ALKPHOS 93 04/27/2023   LIPASE 30 07/10/2022    Electrolytes Lab Results  Component Value Date   NA 138 04/27/2023   K 3.8 04/27/2023   CL 98 04/27/2023   CALCIUM 9.2 04/27/2023   MG 2.4 02/03/2023   PHOS 3.5 01/30/2021    Bone Lab Results  Component Value Date   VD25OH 25.5 (L) 11/25/2020   25OHVITD1  31 04/30/2021   25OHVITD2 <1.0 04/30/2021   25OHVITD3 31 04/30/2021    Inflammation (CRP: Acute Phase) (ESR: Chronic Phase) Lab Results  Component Value Date   CRP 6 04/30/2021   ESRSEDRATE 40 04/30/2021   LATICACIDVEN 1.5 01/25/2022         Note: Above Lab results reviewed.  Recent Imaging Review  DG Chest 2 View CLINICAL DATA:  Pain along the sternum for months.  EXAM: CHEST - 2 VIEW  COMPARISON:  Chest CT 07/09/2023, radiograph 05/07/2023  FINDINGS: Prior median sternotomy. Intact sternal wires. No evidence of sternal fracture or destructive change. Left-sided pacemaker in place. TAVR and aortic valve replacement. Stable heart size and mediastinal contours. The right apical nodule on prior CT is not well seen on radiograph. Emphysematous changes were better demonstrated on prior CT. No acute airspace disease. No pleural effusion. No pneumothorax. Thoracic spondylosis.  IMPRESSION: 1. Prior median sternotomy with intact sternal wires. No acute findings or explanation for sternal pain. 2. Right apical nodule on prior CT not well demonstrated. Emphysematous changes.  Electronically Signed   By: Narda Rutherford M.D.   On: 10/29/2023 11:03 Note: Reviewed        Physical Exam  General appearance: Well nourished, well developed, and well hydrated. In no apparent acute distress Mental status: Alert, oriented x 3 (person, place, & time)       Respiratory: No evidence of acute respiratory distress Eyes: PERLA Vitals: BP (!) 108/57   Pulse 72   Temp (!) 97.3 F (36.3 C)   Ht 4\' 10"  (1.473 m)   Wt 189 lb (85.7 kg)   SpO2 95% Comment: o2 @ 5L [place]  BMI 39.50 kg/m  BMI: Estimated body mass index is 39.5 kg/m as calculated from the following:   Height as of this encounter: 4\' 10"  (1.473 m).   Weight as of this encounter: 189 lb (85.7 kg). Ideal: Patient must be at least 60 in tall to calculate ideal body weight  Assessment   Diagnosis Status  1. Chronic neck pain  (3ry area of Pain) (Posterior) (Bilateral) (L>R)   2. Chronic myofascial pain   3. Chronic shoulder pain (1ry area of Pain) (Bilateral) (L>R)   4. Chronic upper back pain   5. Postop check    Controlled Controlled Controlled   Updated Problems: No problems updated.  Plan of Care  Problem-specific:  Assessment and Plan    Chronic Pain Chronic pain persists post-heart surgery from 2008, with initial 75% improvement for two weeks post-procedure, but pain has recurred. Physical activity exacerbates the pain, affecting daily functioning. Current medications include oxycodone with Tylenol twice daily and gabapentin, which may take up to three weeks to show benefits. Turmeric supplements are encouraged for inflammation control. Continue gabapentin and oxycodone with Tylenol. Consider costochondral joint injections with anesthetic and steroids if pain becomes severe.  Post-Surgical Sternum Pain Persistent sternum pain since heart surgery in 2008, worsened by physical trauma in October. Pain is localized to the sternum, tender to palpation, and extends to the breast area. Previous imaging showed pneumonia, but the primary issue is a non-healing sternum. Costochondral joint injections with anesthetic and steroids are considered if pain becomes severe, spaced two to three months apart. Monitor pain levels and adjust treatment as necessary.  General Health Maintenance Turmeric supplements are being taken for inflammation control. Continue this regimen.  Follow-up Schedule follow-up if further intervention is needed for pain management. Monitor response to the current pain management regimen.       [name] has a current medication list which includes the following long-term medication(s): albuterol, atorvastatin, bumetanide, calcium carbonate-vitamin d, carvedilol, eliquis, gabapentin, levothyroxine, lisinopril, pantoprazole, potassium chloride, sertraline, and  trazodone.  Pharmacotherapy (Medications Ordered): No orders of the defined types were placed in this encounter.  Orders:  Orders Placed This Encounter  Procedures   Nursing Instructions:    Please complete this patient's postprocedure evaluation.    Scheduling Instructions:     Please complete this patient's postprocedure evaluation.   Follow-up plan:   Return if symptoms worsen or fail to improve.      Interventional Therapies  Risk  Complexity Considerations:   Hard of hearing (HOH)  COPD  O2 dependent  positional orthopnea  Eliquis Anticoagulation: (Stop: 3 days  Restart: 6 hours)  A-fib  prosthetic AoV (Prophylactic ampicillin 2g IV)  Cardiac pacemaker     Planned  Pending:   Palliative bilateral trapezius + supraspinatus m. MNB/TPI #5    Under consideration:   Palliative bilateral trapezius + supraspinatus m. MNB/TPI #5    Completed:   Diagnostic bilateral suprascapular NB x2 (09/25/2021) (100/100/100/100 x 2.5 months)  Therapeutic bilateral Trapezius and supraspinatus MNB x4 (06/01/2023) (100/100/100 x 27 days/80-90)    Therapeutic  Palliative (PRN) options:   Therapeutic upper back and shoulder (trapezius & supraspinatus) MNB/TPI    Pharmacotherapy  Poor candidate for any medication that may induce respiratory depression.      Recent Visits Date Type Provider Dept  10/14/23 Procedure visit Delano Metz, MD [facility]  10/06/23 Office Visit Delano Metz, MD [facility]  Showing recent visits within past 90 days and meeting all other requirements Today's Visits Date Type Provider Dept  11/02/23 Office Visit Delano Metz, MD [facility]  Showing today's visits and meeting all other requirements Future Appointments No visits were found meeting these conditions. Showing future appointments within next 90 days and meeting all other requirements  I discussed the assessment and treatment plan with the  patient. The patient was provided an opportunity to ask questions and all were answered. The patient agreed with the plan and demonstrated an understanding of the instructions.  Patient advised to call back or seek an in-person evaluation if the symptoms or condition worsens.  Duration of encounter: 20 minutes.  Total time on encounter, as per AMA guidelines included both the face-to-face and non-face-to-face time personally spent by the physician and/or other qualified health care professional(s) on the day of the encounter (includes time in activities that require the physician or other qualified health care professional and does not include time in activities normally performed by clinical staff). Physician's time may include the following activities when performed: Preparing to see the patient (e.g., pre-charting review of records, searching for previously ordered imaging, lab work, and nerve conduction tests) Review of prior analgesic pharmacotherapies. Reviewing PMP Interpreting ordered tests (e.g., lab work, imaging, nerve conduction tests) Performing post-procedure evaluations, including interpretation of diagnostic procedures Obtaining and/or reviewing separately obtained history Performing a medically appropriate examination and/or evaluation Counseling and educating the patient/family/caregiver Ordering medications, tests, or procedures Referring and communicating with other health care professionals (when not separately reported) Documenting clinical information in the electronic or other health record Independently interpreting results (not separately reported) and communicating results to the patient/ family/caregiver Care coordination (not separately reported)  Note by: Oswaldo Done, MD Date: 11/02/2023; Time: 2:52 PM

## 2023-11-01 NOTE — Telephone Encounter (Signed)
Pt notified we send med  

## 2023-11-01 NOTE — Telephone Encounter (Signed)
Spoke with patient regarding chest x-ray.

## 2023-11-02 ENCOUNTER — Telehealth: Payer: Self-pay | Admitting: Internal Medicine

## 2023-11-02 ENCOUNTER — Ambulatory Visit: Payer: Medicare Other | Attending: Pain Medicine | Admitting: Pain Medicine

## 2023-11-02 ENCOUNTER — Encounter: Payer: Self-pay | Admitting: Pain Medicine

## 2023-11-02 VITALS — BP 108/57 | HR 72 | Temp 97.3°F | Ht <= 58 in | Wt 189.0 lb

## 2023-11-02 DIAGNOSIS — M7918 Myalgia, other site: Secondary | ICD-10-CM | POA: Diagnosis not present

## 2023-11-02 DIAGNOSIS — Z09 Encounter for follow-up examination after completed treatment for conditions other than malignant neoplasm: Secondary | ICD-10-CM | POA: Insufficient documentation

## 2023-11-02 DIAGNOSIS — G8929 Other chronic pain: Secondary | ICD-10-CM | POA: Diagnosis not present

## 2023-11-02 DIAGNOSIS — M25511 Pain in right shoulder: Secondary | ICD-10-CM | POA: Diagnosis not present

## 2023-11-02 DIAGNOSIS — M549 Dorsalgia, unspecified: Secondary | ICD-10-CM | POA: Diagnosis not present

## 2023-11-02 DIAGNOSIS — M542 Cervicalgia: Secondary | ICD-10-CM | POA: Diagnosis not present

## 2023-11-02 DIAGNOSIS — M25512 Pain in left shoulder: Secondary | ICD-10-CM | POA: Insufficient documentation

## 2023-11-02 NOTE — Progress Notes (Signed)
 Safety precautions to be maintained throughout the outpatient stay will include: orient to surroundings, keep bed in low position, maintain call bell within reach at all times, provide assistance with transfer out of bed and ambulation.

## 2023-11-02 NOTE — Telephone Encounter (Signed)
 Non-invasive Vent order and 10/26/23 office notes faxed back to [facility]; [phone]. Scanned-Toni

## 2023-11-03 DIAGNOSIS — J449 Chronic obstructive pulmonary disease, unspecified: Secondary | ICD-10-CM | POA: Diagnosis not present

## 2023-11-03 DIAGNOSIS — G4733 Obstructive sleep apnea (adult) (pediatric): Secondary | ICD-10-CM | POA: Diagnosis not present

## 2023-11-04 ENCOUNTER — Telehealth: Payer: Self-pay | Admitting: Internal Medicine

## 2023-11-04 NOTE — Telephone Encounter (Signed)
 Corrected office notes faxed back to [facility]; [phone]

## 2023-11-07 ENCOUNTER — Other Ambulatory Visit: Payer: Self-pay | Admitting: Family

## 2023-11-08 ENCOUNTER — Other Ambulatory Visit: Payer: Medicare Other

## 2023-11-08 ENCOUNTER — Ambulatory Visit: Payer: Medicare Other | Admitting: Internal Medicine

## 2023-11-09 ENCOUNTER — Other Ambulatory Visit: Payer: Self-pay

## 2023-11-09 MED ORDER — EMPAGLIFLOZIN 10 MG PO TABS: 10.0000 mg | ORAL_TABLET | Freq: Every day | ORAL | 1 refills | Status: DC

## 2023-11-16 ENCOUNTER — Telehealth: Payer: Self-pay

## 2023-11-16 DIAGNOSIS — F411 Generalized anxiety disorder: Secondary | ICD-10-CM

## 2023-11-17 MED ORDER — ALPRAZOLAM 0.25 MG PO TABS: 0.2500 mg | ORAL_TABLET | Freq: Two times a day (BID) | ORAL | 0 refills | Status: DC | PRN

## 2023-11-17 NOTE — Telephone Encounter (Signed)
 Pt.notified

## 2023-11-23 ENCOUNTER — Encounter: Payer: Self-pay | Admitting: Nurse Practitioner

## 2023-11-23 ENCOUNTER — Ambulatory Visit (INDEPENDENT_AMBULATORY_CARE_PROVIDER_SITE_OTHER): Payer: Medicare Other | Admitting: Nurse Practitioner

## 2023-11-23 VITALS — BP 110/63 | HR 71 | Temp 97.2°F | Resp 16 | Ht <= 58 in | Wt 190.4 lb

## 2023-11-23 DIAGNOSIS — M25511 Pain in right shoulder: Secondary | ICD-10-CM | POA: Diagnosis not present

## 2023-11-23 DIAGNOSIS — M5030 Other cervical disc degeneration, unspecified cervical region: Secondary | ICD-10-CM

## 2023-11-23 DIAGNOSIS — R296 Repeated falls: Secondary | ICD-10-CM

## 2023-11-23 DIAGNOSIS — Z9981 Dependence on supplemental oxygen: Secondary | ICD-10-CM

## 2023-11-23 DIAGNOSIS — R2689 Other abnormalities of gait and mobility: Secondary | ICD-10-CM

## 2023-11-23 DIAGNOSIS — F411 Generalized anxiety disorder: Secondary | ICD-10-CM

## 2023-11-23 DIAGNOSIS — M25512 Pain in left shoulder: Secondary | ICD-10-CM

## 2023-11-23 DIAGNOSIS — I5032 Chronic diastolic (congestive) heart failure: Secondary | ICD-10-CM

## 2023-11-23 DIAGNOSIS — J9611 Chronic respiratory failure with hypoxia: Secondary | ICD-10-CM

## 2023-11-23 DIAGNOSIS — M545 Low back pain, unspecified: Secondary | ICD-10-CM

## 2023-11-23 DIAGNOSIS — G8929 Other chronic pain: Secondary | ICD-10-CM

## 2023-11-23 MED ORDER — ALPRAZOLAM 0.25 MG PO TABS
0.2500 mg | ORAL_TABLET | Freq: Two times a day (BID) | ORAL | 2 refills | Status: DC | PRN
Start: 2023-11-23 — End: 2024-02-17

## 2023-11-23 MED ORDER — OXYCODONE-ACETAMINOPHEN 10-325 MG PO TABS: 1.0000 | ORAL_TABLET | Freq: Two times a day (BID) | ORAL | 0 refills | Status: DC | PRN

## 2023-11-23 MED ORDER — OXYCODONE-ACETAMINOPHEN 10-325 MG PO TABS
1.0000 | ORAL_TABLET | Freq: Two times a day (BID) | ORAL | 0 refills | Status: DC | PRN
Start: 2023-11-23 — End: 2024-02-17

## 2023-11-23 NOTE — Progress Notes (Signed)
 [facility] [address] [place], [place] [address]  Internal MEDICINE  Office Visit Note  Patient Name: [name]  [id number]  [id number]  Date of Service: 11/23/2023  Chief Complaint  Patient presents with   Hyperlipidemia   Hypertension   Follow-up    HPI [place] presents for a follow-up visit for chronic pain, GAD, refills and request for PWC.  Patient is requesting a prescription for a power wheelchair to improve her mobility. She reports having experienced falls even with using a walker and the decreased ROM and strength of her upper extremities makes using a walker difficult.  Chronic back, should and neck pain -- using a walker exacerbates her pains. Takes oxycodone  as needed, due for refills GAD-- takes alprazolam  as needed for anxiety, due for refills   PMD ASSESSMENT: Patient requesting a PWC. She has several comorbid medical conditions that contribute to making ambulation and independent mobility difficult. Her mobility would improve with use of a PWC.    Physical Exam for PMD ASSESSMENT: Height:4'10" Weight: 193 lbs O2 saturation: 94% on 5 LPM supplemental O2 via [place] Edema: none  Open wounds or pressure ulcers: none Able to shift weight:  yes   Physical Exam Vitals reviewed.  Constitutional:      General: She is not in acute distress.    Appearance: Normal appearance. She is obese. She is not ill-appearing.  HENT:     Head: Normocephalic and atraumatic.  Eyes:     Pupils: Pupils are equal, round, and reactive to light.  Neck:     Trachea: Trachea normal.  Cardiovascular:     Rate and Rhythm: Normal rate and regular rhythm.     Heart sounds: Normal heart sounds. No murmur heard. Pulmonary:     Effort: Pulmonary effort is normal. No respiratory distress.     Breath sounds: Wheezing present.  Musculoskeletal:     Right shoulder: Decreased range of motion. Decreased strength.     Left shoulder: Decreased range of motion. Decreased strength.      Right elbow: Decreased range of motion.     Left elbow: Decreased range of motion.     Right wrist: Decreased range of motion.     Left wrist: Decreased range of motion.     Right hand: Decreased range of motion. Decreased strength.     Left hand: Decreased range of motion. Decreased strength.     Cervical back: Spasms present. Pain with movement present. Decreased range of motion.     Thoracic back: Decreased range of motion.     Lumbar back: Spasms present. Decreased range of motion.     Right hip: Decreased range of motion. Decreased strength.     Left hip: Decreased range of motion. Decreased strength.     Right lower leg: 1+ Edema present.     Left lower leg: 1+ Edema present.     Right ankle: Decreased range of motion.     Left ankle: Decreased range of motion.     Right foot: Decreased range of motion.     Left foot: Decreased range of motion.  Skin:    General: Skin is warm and dry.     Capillary Refill: Capillary refill takes less than 2 seconds.  Neurological:     Mental Status: She is alert and oriented to person, place, and time.     Motor: Weakness present.     Gait: Gait abnormal.  Psychiatric:        Mood and  Affect: Mood normal.        Behavior: Behavior normal.     Muscular strength of extremities: RUE: 3/5  LUE: 3/5 Grip strength: 1/5 bilaterally RLE: 2/5 LLE: 2/5   Baseline pain score on scale of 0-10: 10 out of 10 without pain medication, improves to about 6 out of 10 with her chronic pain medication    ROM:  Bilateral upper extremities: 50% decreased from normal Neck: 75% decreased from normal.  Thoracic and lumbar spine : >75% decreased from normal  Hips: >75 % decreased from normal  Lower extremities: 75% decreased from normal.    Gait pattern: slow, shuffling, unsteady without walker or cane. Normally walks with a walker.   PMD PLAN:   Medical conditions that impact the patient's mobility needs: Chronic respiratory failure with  hypoxia Oxygen  dependent Chronic diastolic CHF Chronic pain of both shoulders Chronic low back pain without sciatica Cervical degenerative disc disease Multiple falls Impaired gait and mobility    MRADLs that are impaired in the home: PMD is necessary to get to the bathroom for toileting and showering.  PMD is necessary to get to the kitchen to prepare meals and eat. PMD is necessary to get to the bedroom for grooming, dressing and to go to bed for sleep.    Why will a cane or walker not medically meet your patients mobility needs in the home? -Patient cannot use a cane or walker due to chronic pain affecting mobility and balance and having a history of multiple falls.    Why will a manual wheelchair not medically meet your patient's mobility needs in the home? -Patient cannot use a MWC due to poor strength and ROM of her upper extremities which makes it difficult for the patient to maneuver a MWC unassisted.    Why will a scooter (POV) not medically meet your patient's needs in the home? -Patient cannot use a POV due to poor balance, decreased strength and decreased ROM. -Patient cannot operate the tiller of a POV due to poor ROM of most of her upper and lower extremity joints.   How will a PWC improve your patient's ability to perform their MRADLs in the home? A PWC will improve her ability to get from the bed to the bath/toilet and kitchen.  The swingaway hardware to move the joystick controller out of the way will allow the beneficiary to maneuver in and out of the wheelchair easily to position himself to perform her MRADLs.  The swingaway hardware will also allow the patient to move closer to the table to eat.    Mayelin E Clay Can safely operate the power mobility device (PWC) both mentally and physically based on my clinical assessment of the patient.    BASIL CARTRIGHT States that she is willing and motivated to use the power mobility device in the home.      Current  Medication: Outpatient Encounter Medications as of 11/23/2023  Medication Sig   albuterol  (VENTOLIN  HFA) 108 (90 Base) MCG/ACT inhaler Inhale 2 puffs into the lungs every 6 (six) hours as needed for wheezing or shortness of breath.   atorvastatin  (LIPITOR) 40 MG tablet TAKE 1 TABLET BY MOUTH EVERY NIGHT AT BEDTIME   BREZTRI  AEROSPHERE 160-9-4.8 MCG/ACT AERO INHALE 2 PUFFS INTO THE LUNGS TWICE DAILY   bumetanide  (BUMEX ) 1 MG tablet TAKE 2 TABLET BY MOUTH TWICE DAILY, ADDITIONAL 2 TABLETS IN THE EVENING AS NEEDED   Calcium  Carbonate-Vitamin D  (CALCIUM  600+D PO) Take 1 tablet by mouth  daily.   carvedilol  (COREG ) 25 MG tablet Take 25 mg by mouth 2 (two) times daily with a meal.   Cholecalciferol  (VITAMIN D3) 10 MCG (400 UNIT) CAPS Take by mouth daily.   Coenzyme Q10 (COQ10) 200 MG CAPS Take 200 mg by mouth daily.   diclofenac  Sodium (VOLTAREN ) 1 % GEL Apply 2 g topically 4 (four) times daily.   ELIQUIS  5 MG TABS tablet TAKE 1 TABLET BY MOUTH TWICE DAILY   empagliflozin  (JARDIANCE ) 10 MG TABS tablet Take 1 tablet (10 mg total) by mouth daily.   gabapentin  (NEURONTIN ) 100 MG capsule Take 1 capsule (100 mg total) by mouth at bedtime.   levothyroxine  (SYNTHROID ) 25 MCG tablet TAKE 1 TABLET BY MOUTH DAILY BEFORE BREAKFAST   lisinopril  (ZESTRIL ) 40 MG tablet TAKE 1 TABLET(40 MG) BY MOUTH DAILY   loratadine  (CLARITIN ) 10 MG tablet Take 10 mg by mouth daily.   pantoprazole  (PROTONIX ) 40 MG tablet TAKE 1 TABLET BY MOUTH EVERY DAY   pneumococcal 20-valent conjugate vaccine (PREVNAR 20) 0.5 ML injection Inject 0.5 mLs into the muscle once as needed for up to 1 dose for immunization.   potassium chloride  (KLOR-CON ) 10 MEQ tablet TAKE 4 TABLETS BY MOUTH TWICE DAILY   sertraline  (ZOLOFT ) 25 MG tablet Take 1 tablet by mouth daily for 1 week then increase to 2 tablets by mouth daily.   sodium chloride  (OCEAN) 0.65 % SOLN nasal spray Place 1 spray into both nostrils as needed for congestion.   traZODone  (DESYREL )  50 MG tablet TAKE 1 TABLET BY MOUTH EVERY DAY AT BEDTIME   TURMERIC PO Take by mouth daily at 6 (six) AM.   [DISCONTINUED] ALPRAZolam  (XANAX ) 0.25 MG tablet Take 1 tablet (0.25 mg total) by mouth 2 (two) times daily as needed for anxiety.   [DISCONTINUED] oxyCODONE -acetaminophen  (PERCOCET) 10-325 MG tablet Take 1 tablet by mouth 2 (two) times daily as needed for pain.   ALPRAZolam  (XANAX ) 0.25 MG tablet Take 1 tablet (0.25 mg total) by mouth 2 (two) times daily as needed for anxiety.   oxyCODONE -acetaminophen  (PERCOCET) 10-325 MG tablet Take 1 tablet by mouth 2 (two) times daily as needed for pain.   [START ON 12/21/2023] oxyCODONE -acetaminophen  (PERCOCET) 10-325 MG tablet Take 1 tablet by mouth 2 (two) times daily as needed for pain.   [START ON 01/18/2024] oxyCODONE -acetaminophen  (PERCOCET) 10-325 MG tablet Take 1 tablet by mouth 2 (two) times daily as needed for pain.   No facility-administered encounter medications on file as of 11/23/2023.    Surgical History: Past Surgical History:  Procedure Laterality Date   ABDOMINAL AORTIC ANEURYSM REPAIR  2008   ABDOMINAL HYSTERECTOMY     AORTIC VALVE REPLACEMENT  2008   CARDIAC CATHETERIZATION     [facility]   CARDIOVERSION N/A 08/15/2018   Procedure: CARDIOVERSION;  Surgeon: Wenona Hamilton, MD;  Location: [facility] ORS;  Service: Cardiovascular;  Laterality: N/A;   CARDIOVERSION N/A 11/14/2018   Procedure: CARDIOVERSION (CATH LAB);  Surgeon: Wenona Hamilton, MD;  Location: [facility] ORS;  Service: Cardiovascular;  Laterality: N/A;   CARDIOVERSION N/A 06/05/2019   Procedure: CARDIOVERSION;  Surgeon: Wenona Hamilton, MD;  Location: [facility] ORS;  Service: Cardiovascular;  Laterality: N/A;   CARDIOVERSION N/A 08/14/2019   Procedure: CARDIOVERSION;  Surgeon: Wenona Hamilton, MD;  Location: [facility] ORS;  Service: Cardiovascular;  Laterality: N/A;   CARDIOVERSION N/A 11/09/2019   Procedure: CARDIOVERSION;  Surgeon: Percival Brace, MD;  Location: [facility] ORS;  Service:  Cardiovascular;  Laterality: N/A;   CARDIOVERSION  N/A 01/17/2020   Procedure: CARDIOVERSION;  Surgeon: Percival Brace, MD;  Location: [facility] ORS;  Service: Cardiovascular;  Laterality: N/A;   CARPAL TUNNEL RELEASE     ELECTROPHYSIOLOGIC STUDY N/A 08/17/2016   Procedure: Cardioversion;  Surgeon: Wenona Hamilton, MD;  Location: [facility] ORS;  Service: Cardiovascular;  Laterality: N/A;   TUMOR EXCISION Left    x3 (arm)    Medical History: Past Medical History:  Diagnosis Date   Aortic 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 s/p bioprosthetic valve replacement 2008 at [facility];  b. 01/2015 Echo: EF 60-65%, no rwma, Gr1 DD, mildly dil LA, nl RV fxn.   Aspiration pneumonia ([name])    Atrial fibrillation with RVR ([name]) 01/11/2015   Atrial flutter ([name])    a. 08/2016 s/p DCCV.  Remains on flecainide  50 mg bid.   Basal skull fracture ([name]) 20 yrs ago   CHF (congestive heart failure) ([name])    Chronic respiratory failure ([name])    COPD (chronic obstructive pulmonary disease) ([name])    a. 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 2008 prior to Aortic aneurysm repair-->nl cors.   History of stress test    a. 10/2015 MV: no ischemia/infarct.   HLD (hyperlipidemia)    HTN (hypertension)    Hypothyroidism    Obesity    PAF (paroxysmal atrial fibrillation) ([name])    a. on Eliquis ; b. CHADS2VASc = 3 (HTN, age x 1, female).   Paroxysmal atrial fibrillation ([name]) 01/24/2015   Right upper quadrant abdominal tenderness without rebound tenderness 03/02/2018   S/P ascending aortic aneurysm repair 2008    Family History: Family History  Problem Relation Age of Onset   Stroke Father    Stroke Paternal Grandmother    Breast cancer Neg Hx     Social History   Socioeconomic History   Marital status: Divorced    Spouse name: Not on file   Number of children: Not on file   Years of  education: Not on file   Highest education level: Not on file  Occupational History   Not on file  Tobacco Use   Smoking status: Former    Types: Cigarettes   Smokeless tobacco: Never  Vaping Use   Vaping status: Never Used  Substance and Sexual Activity   Alcohol use: No   Drug use: No   Sexual activity: Never    Birth control/protection: Surgical  Other Topics Concern   Not on file  Social History Narrative   Not on file   Social Drivers of Health   Financial Resource Strain: Low Risk  (11/03/2022)   Received from [facility]   Overall Financial Resource Strain (CARDIA)    Difficulty of Paying Living Expenses: Not hard at all  Recent Concern: Financial Resource Strain - Medium Risk (11/02/2022)   Received from [facility]   Overall Financial Resource Strain (CARDIA)    Difficulty of Paying Living Expenses: Somewhat hard  Food Insecurity: Food Insecurity Present (04/02/2023)   Hunger Vital Sign    Worried About Running Out of Food in the Last Year: Sometimes true    Ran Out of Food in the Last Year: Sometimes true  Transportation Needs: No Transportation Needs (02/02/2023)   PRAPARE - Transportation    Lack of  Transportation (Medical): No    Lack of Transportation (Non-Medical): No  Physical Activity: Not on file  Stress: Not on file  Social Connections: Not on file  Intimate Partner Violence: Not At Risk (04/02/2023)   Humiliation, Afraid, Rape, and Kick questionnaire    Fear of Current or Ex-Partner: No    Emotionally Abused: No    Physically Abused: No    Sexually Abused: No      Review of Systems  Constitutional:  Positive for activity change, fatigue and unexpected weight change.  Respiratory:  Positive for cough, chest tightness, shortness of breath and wheezing.        Symptoms are persistent at baseline   Cardiovascular:  Positive for leg swelling. Negative for chest pain and palpitations.   Gastrointestinal: Negative.   Genitourinary: Negative.   Musculoskeletal:  Positive for arthralgias, back pain, gait problem, joint swelling, myalgias and neck pain.  Skin: Negative.   Neurological:  Positive for weakness.  Psychiatric/Behavioral:  Positive for behavioral problems. Negative for self-injury, sleep disturbance and suicidal ideas. The patient is nervous/anxious.     Vital Signs: BP 110/63   Pulse 71   Temp (!) 97.2 F (36.2 C)   Resp 16   Ht 4\' 10"  (1.473 m)   Wt 190 lb 6.4 oz (86.4 kg)   SpO2 94% Comment: 5L  BMI 39.79 kg/m    Physical Exam Vitals reviewed.  Constitutional:      General: She is not in acute distress.    Appearance: Normal appearance. She is obese. She is not ill-appearing.  HENT:     Head: Normocephalic and atraumatic.  Eyes:     Pupils: Pupils are equal, round, and reactive to light.  Neck:     Trachea: Trachea normal.  Cardiovascular:     Rate and Rhythm: Normal rate and regular rhythm.     Heart sounds: Normal heart sounds. No murmur heard. Pulmonary:     Effort: Pulmonary effort is normal. No respiratory distress.     Breath sounds: Wheezing present.  Musculoskeletal:     Right shoulder: Decreased range of motion. Decreased strength.     Left shoulder: Decreased range of motion. Decreased strength.     Right elbow: Decreased range of motion.     Left elbow: Decreased range of motion.     Right wrist: Decreased range of motion.     Left wrist: Decreased range of motion.     Right hand: Decreased range of motion. Decreased strength.     Left hand: Decreased range of motion. Decreased strength.     Cervical back: Spasms present. Pain with movement present. Decreased range of motion.     Thoracic back: Decreased range of motion.     Lumbar back: Spasms present. Decreased range of motion.     Right hip: Decreased range of motion. Decreased strength.     Left hip: Decreased range of motion. Decreased strength.     Right lower leg: 1+  Edema present.     Left lower leg: 1+ Edema present.     Right ankle: Decreased range of motion.     Left ankle: Decreased range of motion.     Right foot: Decreased range of motion.     Left foot: Decreased range of motion.  Skin:    General: Skin is warm and dry.     Capillary Refill: Capillary refill takes less than 2 seconds.  Neurological:     Mental Status: She is alert and oriented to  person, place, and time.     Motor: Weakness present.     Gait: Gait abnormal.  Psychiatric:        Mood and Affect: Mood normal.        Behavior: Behavior normal.        Assessment/Plan: 1. Chronic respiratory failure with hypoxia ([name]) (Primary) DME order for PWC Continue supplemental oxygen  use as instructed.  - DME Wheelchair electric  2. Chronic diastolic CHF (congestive heart failure) ([name]) DME order for [facility] - DME Wheelchair electric  3. Chronic pain of both shoulders DME order for PWC Continue prn oxycodone  as prescribed, follow up in 3 months for additional refills of oxycodone .  - oxyCODONE -acetaminophen  (PERCOCET) 10-325 MG tablet; Take 1 tablet by mouth 2 (two) times daily as needed for pain.  Dispense: 60 tablet; Refill: 0 - oxyCODONE -acetaminophen  (PERCOCET) 10-325 MG tablet; Take 1 tablet by mouth 2 (two) times daily as needed for pain.  Dispense: 60 tablet; Refill: 0 - oxyCODONE -acetaminophen  (PERCOCET) 10-325 MG tablet; Take 1 tablet by mouth 2 (two) times daily as needed for pain.  Dispense: 60 tablet; Refill: 0 - DME Wheelchair electric  4. DDD (degenerative disc disease), cervical DME order for [facility] - DME Wheelchair electric  5. Chronic bilateral low back pain without sciatica DME order for [facility] - DME Wheelchair electric  6. Multiple falls DME order for [facility] - DME Wheelchair electric  7. Impaired gait and mobility DME order for [facility] - DME Wheelchair electric  8. Oxygen  dependent DME order for PWC - DME Wheelchair electric  9. GAD (generalized anxiety  disorder) Continue prn alprazolam  as prescribed. Follow up in 3 months for additional refills.  - ALPRAZolam  (XANAX ) 0.25 MG tablet; Take 1 tablet (0.25 mg total) by mouth 2 (two) times daily as needed for anxiety.  Dispense: 60 tablet; Refill: 2   General Counseling: Joyia verbalizes understanding of the findings of todays visit and agrees with plan of treatment. I have discussed any further diagnostic evaluation that may be needed or ordered today. We also reviewed her medications today. she has been encouraged to call the office with any questions or concerns that should arise related to todays visit.    No orders of the defined types were placed in this encounter.   Meds ordered this encounter  Medications   ALPRAZolam  (XANAX ) 0.25 MG tablet    Sig: Take 1 tablet (0.25 mg total) by mouth 2 (two) times daily as needed for anxiety.    Dispense:  60 tablet    Refill:  2    For refill   oxyCODONE -acetaminophen  (PERCOCET) 10-325 MG tablet    Sig: Take 1 tablet by mouth 2 (two) times daily as needed for pain.    Dispense:  60 tablet    Refill:  0    Refill for march   oxyCODONE -acetaminophen  (PERCOCET) 10-325 MG tablet    Sig: Take 1 tablet by mouth 2 (two) times daily as needed for pain.    Dispense:  60 tablet    Refill:  0    Refill for april   oxyCODONE -acetaminophen  (PERCOCET) 10-325 MG tablet    Sig: Take 1 tablet by mouth 2 (two) times daily as needed for pain.    Dispense:  60 tablet    Refill:  0    Refill for may    Return in about 3 months (around 02/16/2024) for F/U, pain med refill, anxiety med refill, Emric Kowalewski PCP.   Total time spent:30 Minutes Time spent includes review  of chart, medications, test results, and follow up plan with the patient.   [place] Controlled Substance Database was reviewed by me.  This patient was seen by Laurence Pons, FNP-C in collaboration with Dr. Verneta Gone as a part of collaborative care agreement.   Masai Kidd R. Bobbi Burow, MSN,  FNP-C Internal medicine

## 2023-11-26 IMAGING — CR DG CHEST 1V PORT
1 series · 1 of 1 positions shown · non-contrast
Comparison: 11/11/2021

CLINICAL DATA: Hypoxia

EXAM:
PORTABLE CHEST 1 VIEW

[chest ap]
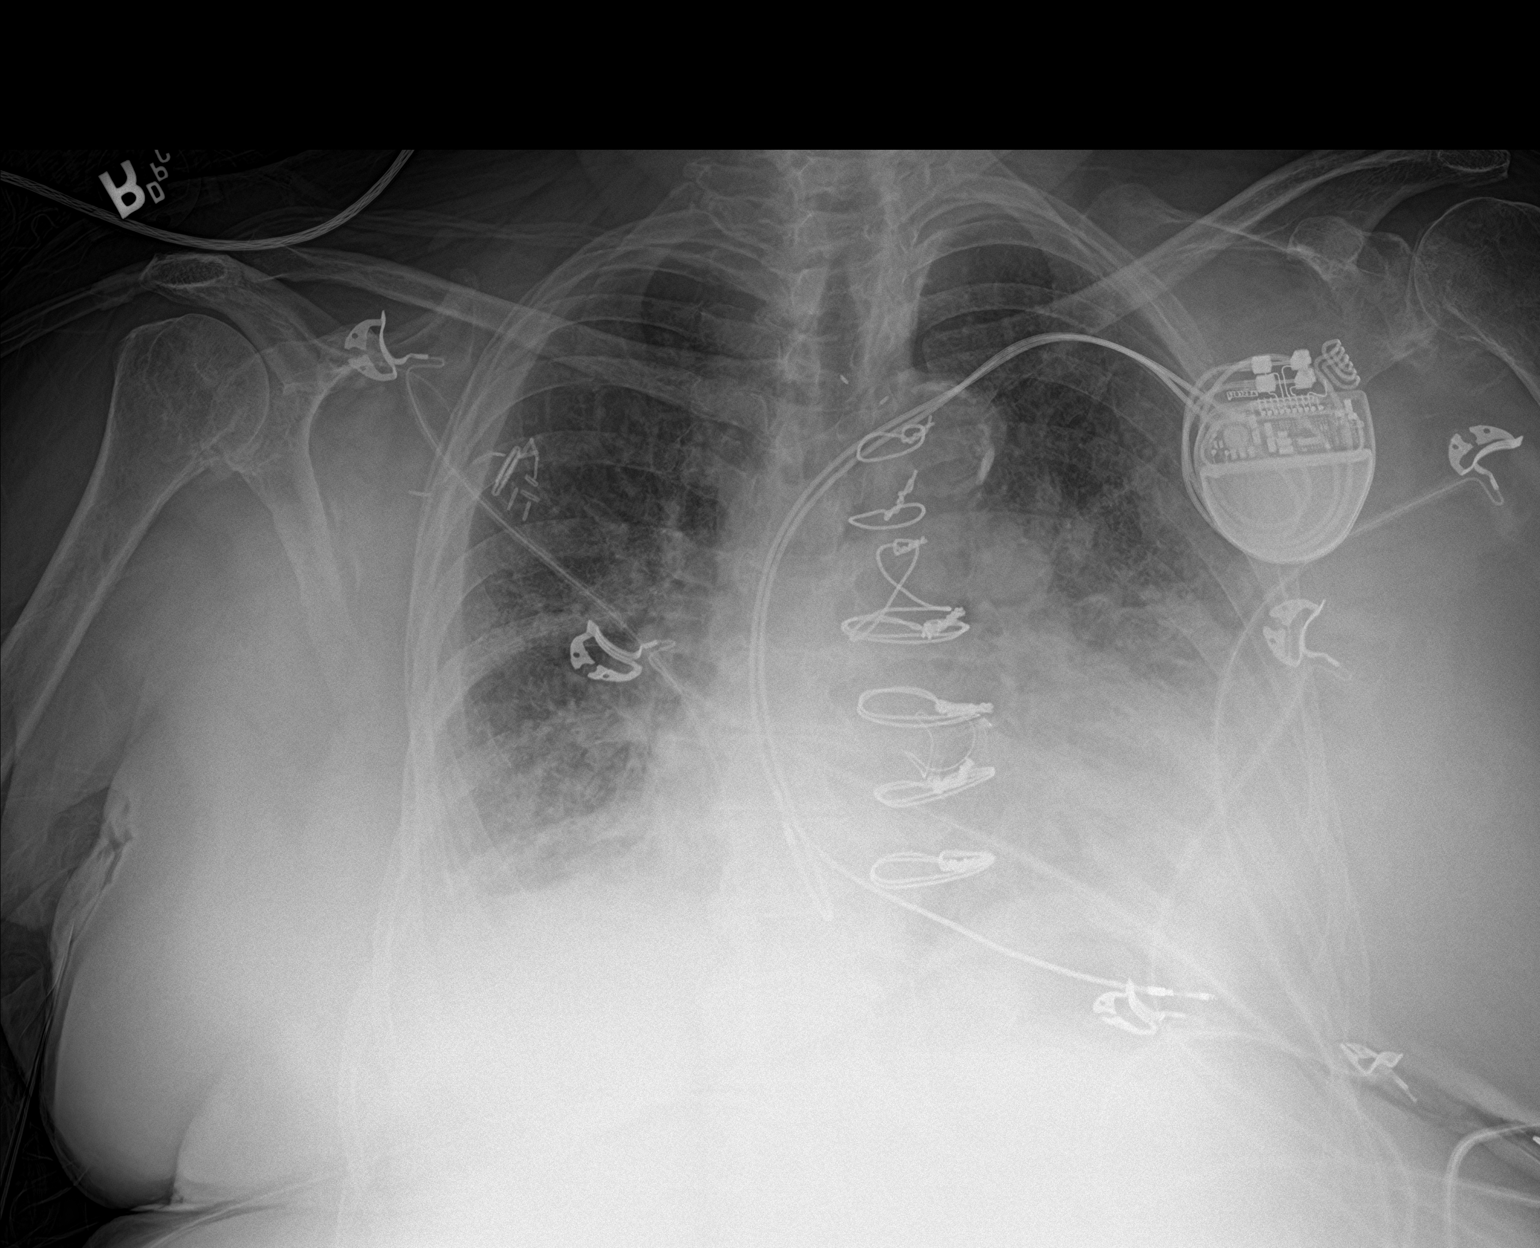

[1 of 1 positions shown; findings below may reference images not displayed]

FINDINGS: Cardiac shadow is enlarged but stable. Postsurgical changes are
noted. Pacing device is again seen. Mild vascular congestion is
noted with interstitial edema increased from the prior exam. No
focal infiltrate or effusion is seen. No bony abnormality is noted.
IMPRESSION: Mild CHF.

## 2023-11-26 IMAGING — CT CT HEAD W/O CM
4 series · 17 of 47 positions shown, 19 images · non-contrast
Comparison: 01/29/2021

CLINICAL DATA: Altered mental status following ingestion of Delta 8
gummies, initial encounter



[Series 2: head bone · axial · 0.41mm/px · z∈[-145,-89]mm · 4 of 83 slices shown]
[im 9/83  bone]
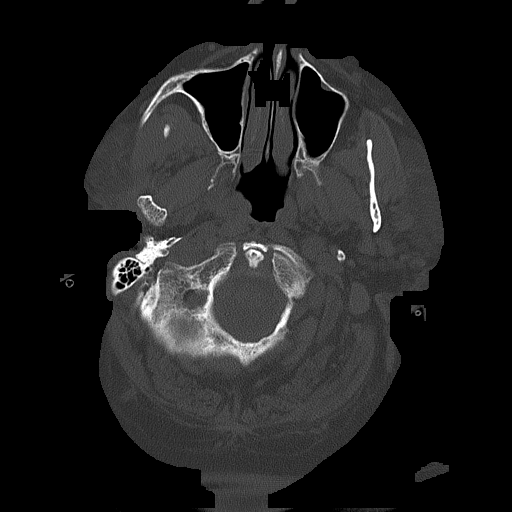
[im 17/83  bone]
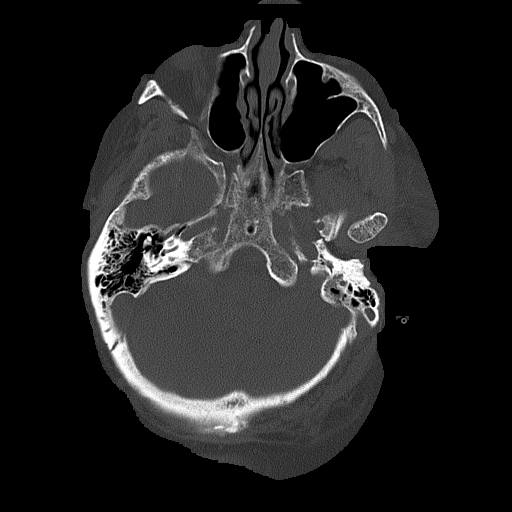
[im 25/83  bone]
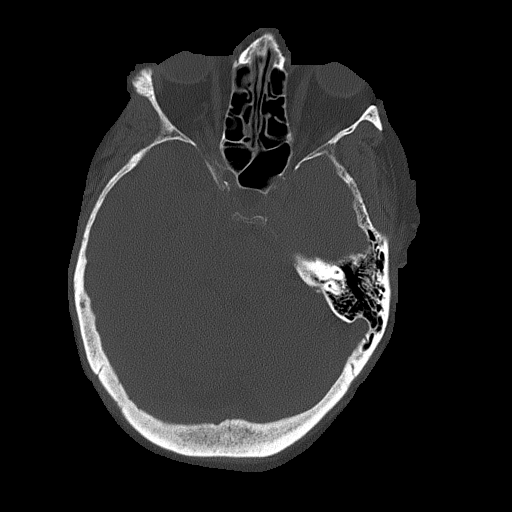
[im 37/83  bone]
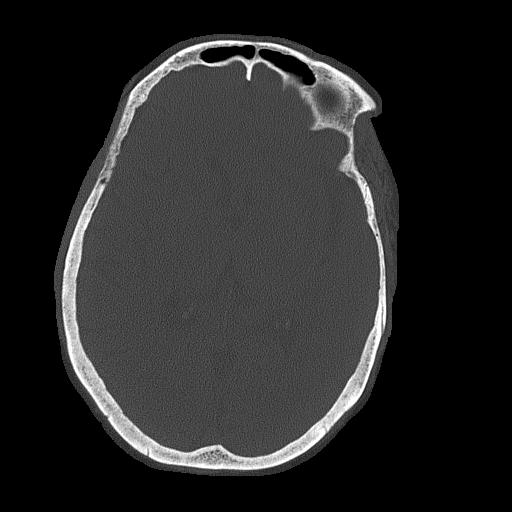

[Series 3: head wo · axial · 0.41mm/px · z∈[-141,-21]mm · 7 of 34 slices shown, 9 images]
[im 5/34  brain]
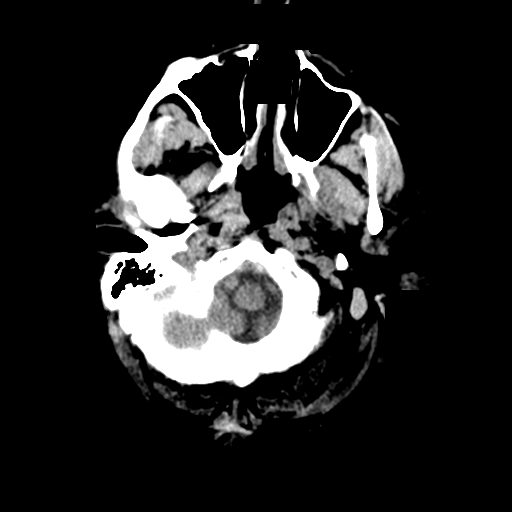
[im 5/34  bone]
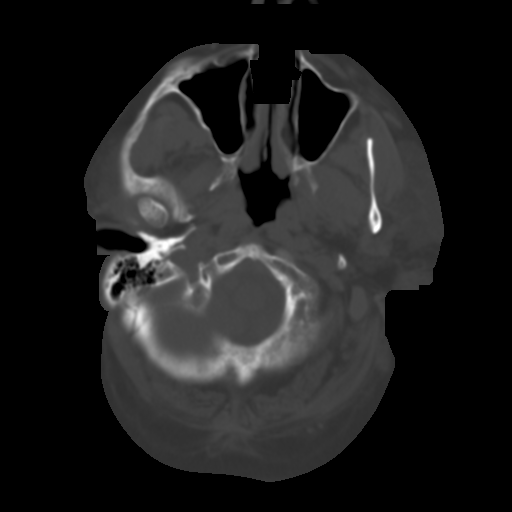
[im 9/34  brain]
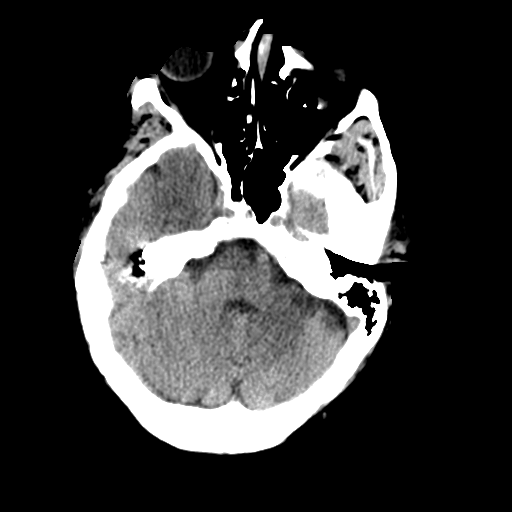
[im 13/34  brain]
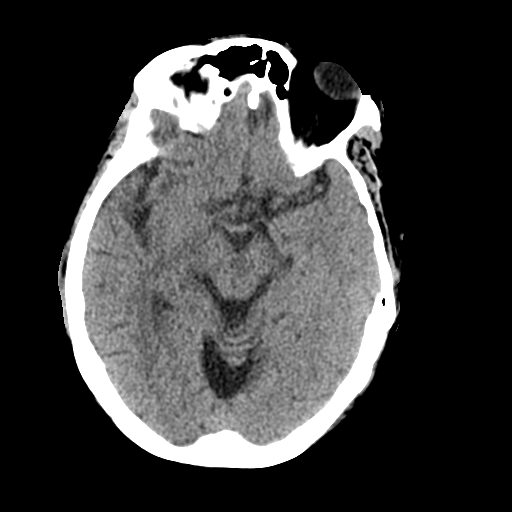
[im 17/34  brain]
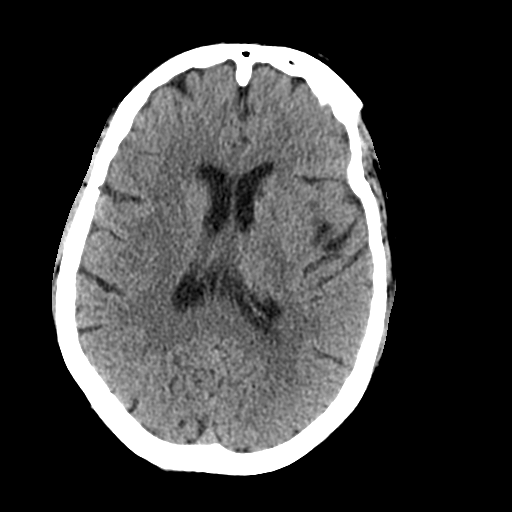
[im 21/34  brain]
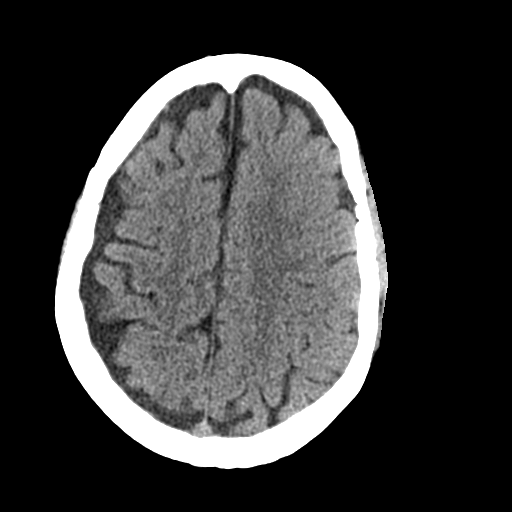
[im 21/34  bone]
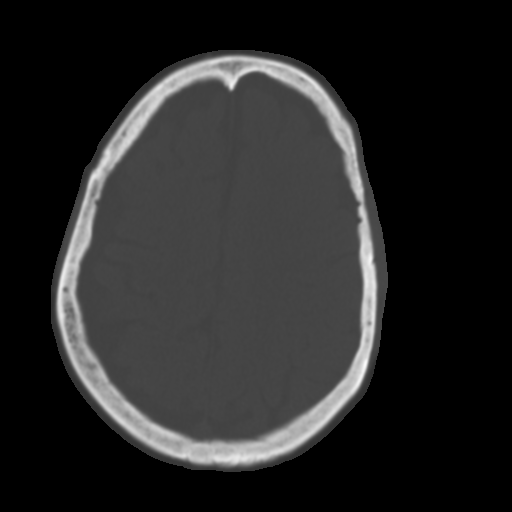
[im 25/34  brain]
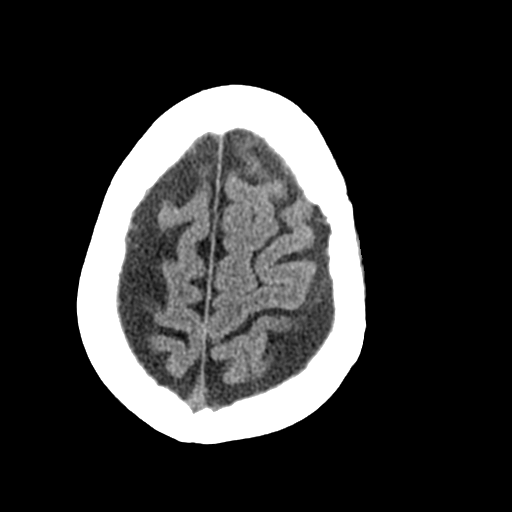
[im 29/34  brain]
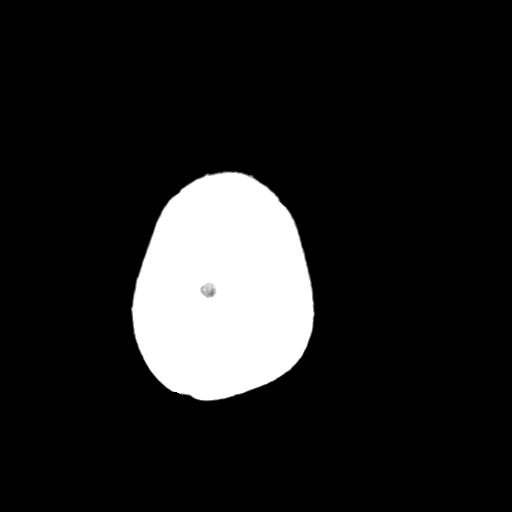

[Series 4: coronal soft tissue · coronal · 0.31mm/px · 3 of 69 slices shown]
[im 23/69  brain]
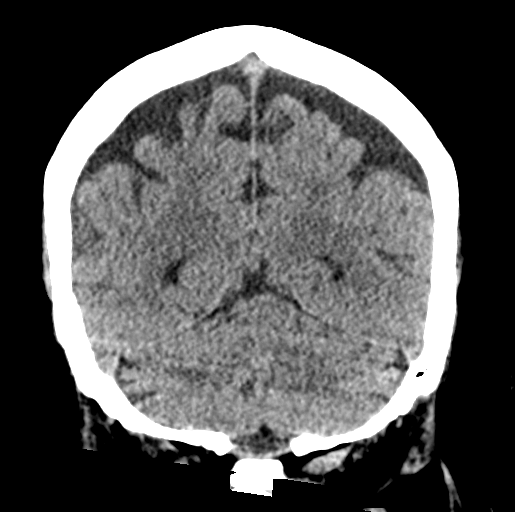
[im 31/69  brain]
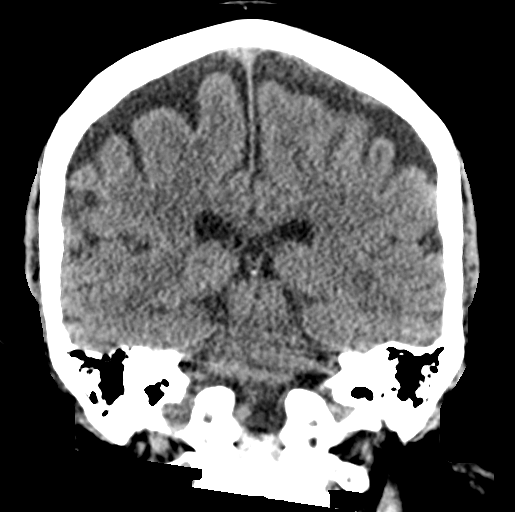
[im 38/69  brain]
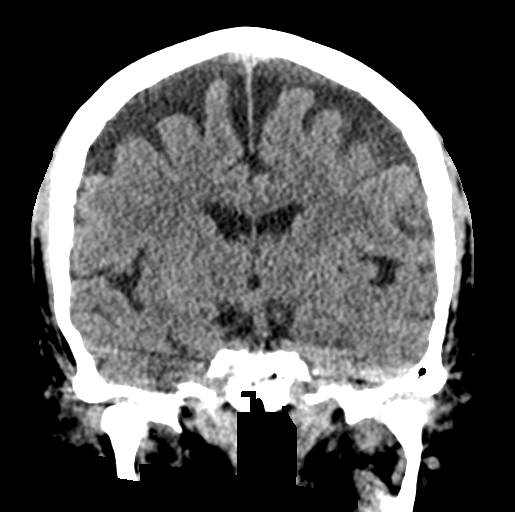

[Series 5: sagittal soft tissue · sagittal · 0.31mm/px · 3 of 58 slices shown]
[im 21/58  brain]
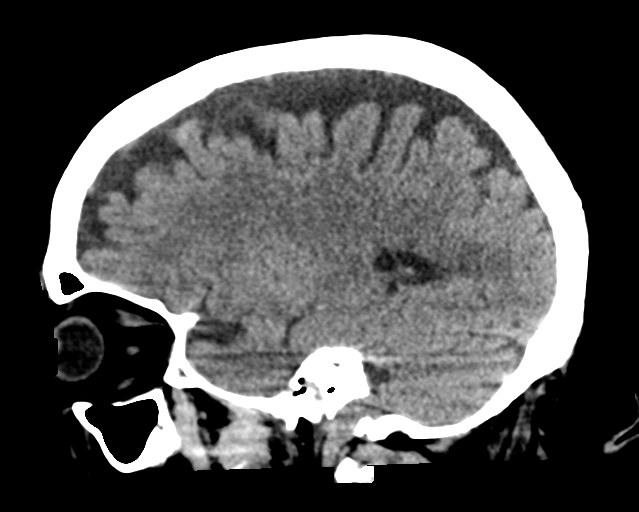
[im 29/58  brain]
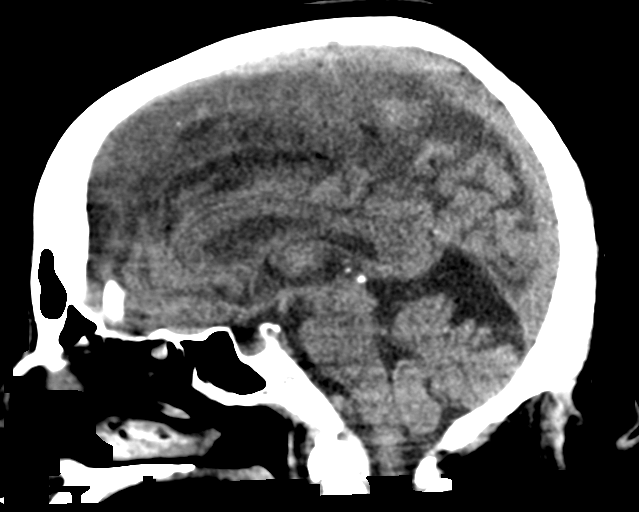
[im 37/58  brain]
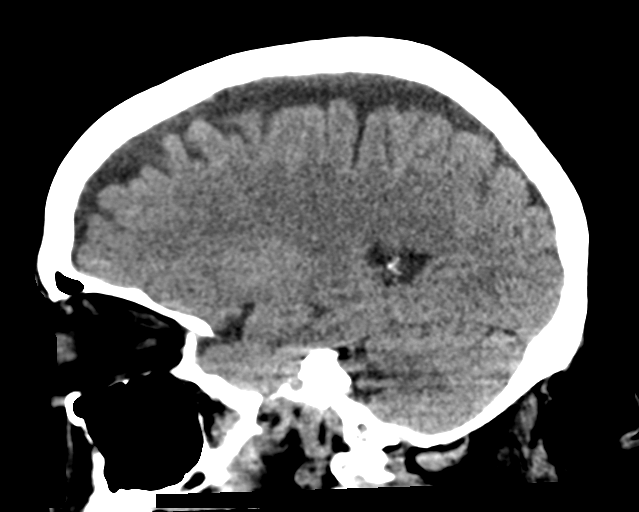

[17 of 47 positions shown; findings below may reference images not displayed]

FINDINGS: Brain: No evidence of acute infarction, hemorrhage, hydrocephalus,
extra-axial collection or mass lesion/mass effect. Chronic atrophic
and ischemic changes are again seen.

Vascular: No hyperdense vessel or unexpected calcification.

Skull: Normal. Negative for fracture or focal lesion.

Sinuses/Orbits: No acute finding.

Other: None.
IMPRESSION: Chronic atrophic and ischemic changes without acute abnormality.

## 2023-12-01 DIAGNOSIS — J449 Chronic obstructive pulmonary disease, unspecified: Secondary | ICD-10-CM | POA: Diagnosis not present

## 2023-12-01 DIAGNOSIS — G4733 Obstructive sleep apnea (adult) (pediatric): Secondary | ICD-10-CM | POA: Diagnosis not present

## 2023-12-07 DIAGNOSIS — I4891 Unspecified atrial fibrillation: Secondary | ICD-10-CM | POA: Diagnosis not present

## 2023-12-07 DIAGNOSIS — I7121 Aneurysm of the ascending aorta, without rupture: Secondary | ICD-10-CM | POA: Diagnosis not present

## 2023-12-07 DIAGNOSIS — Z950 Presence of cardiac pacemaker: Secondary | ICD-10-CM | POA: Diagnosis not present

## 2023-12-07 DIAGNOSIS — Q2381 Bicuspid aortic valve: Secondary | ICD-10-CM | POA: Diagnosis not present

## 2023-12-09 ENCOUNTER — Other Ambulatory Visit: Payer: Self-pay

## 2023-12-09 MED ORDER — EMPAGLIFLOZIN 10 MG PO TABS: 10.0000 mg | ORAL_TABLET | Freq: Every day | ORAL | 5 refills | Status: DC

## 2023-12-10 ENCOUNTER — Other Ambulatory Visit: Payer: Self-pay | Admitting: Family

## 2023-12-13 ENCOUNTER — Other Ambulatory Visit: Payer: Self-pay | Admitting: Physician Assistant

## 2023-12-13 DIAGNOSIS — R61 Generalized hyperhidrosis: Secondary | ICD-10-CM

## 2023-12-28 ENCOUNTER — Encounter: Payer: Self-pay | Admitting: Nurse Practitioner

## 2023-12-28 ENCOUNTER — Ambulatory Visit (INDEPENDENT_AMBULATORY_CARE_PROVIDER_SITE_OTHER): Admitting: Nurse Practitioner

## 2023-12-28 VITALS — BP 116/79 | HR 70 | Temp 97.1°F | Resp 16 | Ht <= 58 in | Wt 193.4 lb

## 2023-12-28 DIAGNOSIS — L853 Xerosis cutis: Secondary | ICD-10-CM | POA: Diagnosis not present

## 2023-12-28 DIAGNOSIS — M25511 Pain in right shoulder: Secondary | ICD-10-CM | POA: Diagnosis not present

## 2023-12-28 DIAGNOSIS — I5032 Chronic diastolic (congestive) heart failure: Secondary | ICD-10-CM

## 2023-12-28 DIAGNOSIS — G8929 Other chronic pain: Secondary | ICD-10-CM

## 2023-12-28 DIAGNOSIS — E66813 Obesity, class 3: Secondary | ICD-10-CM

## 2023-12-28 DIAGNOSIS — Z6841 Body Mass Index (BMI) 40.0 and over, adult: Secondary | ICD-10-CM

## 2023-12-28 DIAGNOSIS — G5691 Unspecified mononeuropathy of right upper limb: Secondary | ICD-10-CM | POA: Diagnosis not present

## 2023-12-28 MED ORDER — TRIAMCINOLONE ACETONIDE 0.1 % EX CREA: 1.0000 | TOPICAL_CREAM | Freq: Two times a day (BID) | CUTANEOUS | 0 refills | Status: DC | PRN

## 2023-12-28 NOTE — Progress Notes (Signed)
 [facility] [address] [place], [place] [address]  Internal MEDICINE  Office Visit Note  Patient Name: [name]  [id number]  [id number]  Date of Service: 12/28/2023  Chief Complaint  Patient presents with   Acute Visit    Right arm burning     HPI [name] presents for an acute sick visit for burning pain on the posterior right upper arm Burning pain of the right upper arm, has chronic arthritis of the right shoulder This burning pain started about 1 month ago, the skin on this part of the arm is very dry which could be causing the burning raw feeling. Also has a numbness of the right chest near the sternum s/p hitting self in chest with portable oxygen  by accident in October last year.  Gained 3 lbs since last month    Current Medication:  Outpatient Encounter Medications as of 12/28/2023  Medication Sig   albuterol  (VENTOLIN  HFA) 108 (90 Base) MCG/ACT inhaler Inhale 2 puffs into the lungs every 6 (six) hours as needed for wheezing or shortness of breath.   ALPRAZolam  (XANAX ) 0.25 MG tablet Take 1 tablet (0.25 mg total) by mouth 2 (two) times daily as needed for anxiety.   atorvastatin  (LIPITOR) 40 MG tablet TAKE 1 TABLET BY MOUTH EVERY NIGHT AT BEDTIME   BREZTRI  AEROSPHERE 160-9-4.8 MCG/ACT AERO INHALE 2 PUFFS INTO THE LUNGS TWICE DAILY   bumetanide  (BUMEX ) 1 MG tablet TAKE 2 TABLET BY MOUTH TWICE DAILY, ADDITIONAL 2 TABLETS IN THE EVENING AS NEEDED   Calcium  Carbonate-Vitamin D  (CALCIUM  600+D PO) Take 1 tablet by mouth daily.   carvedilol  (COREG ) 25 MG tablet Take 25 mg by mouth 2 (two) times daily with a meal.   Cholecalciferol  (VITAMIN D3) 10 MCG (400 UNIT) CAPS Take by mouth daily.   Coenzyme Q10 (COQ10) 200 MG CAPS Take 200 mg by mouth daily.   diclofenac  Sodium (VOLTAREN ) 1 % GEL Apply 2 g topically 4 (four) times daily.   ELIQUIS  5 MG TABS tablet TAKE 1 TABLET BY MOUTH TWICE DAILY   empagliflozin  (JARDIANCE ) 10 MG TABS tablet Take 1 tablet (10 mg total)  by mouth daily.   gabapentin  (NEURONTIN ) 100 MG capsule TAKE 1 CAPSULE(100 MG) BY MOUTH AT BEDTIME   levothyroxine  (SYNTHROID ) 25 MCG tablet TAKE 1 TABLET BY MOUTH DAILY BEFORE BREAKFAST   lisinopril  (ZESTRIL ) 40 MG tablet TAKE 1 TABLET(40 MG) BY MOUTH DAILY   loratadine  (CLARITIN ) 10 MG tablet Take 10 mg by mouth daily.   oxyCODONE -acetaminophen  (PERCOCET) 10-325 MG tablet Take 1 tablet by mouth 2 (two) times daily as needed for pain.   oxyCODONE -acetaminophen  (PERCOCET) 10-325 MG tablet Take 1 tablet by mouth 2 (two) times daily as needed for pain.   [START ON 01/18/2024] oxyCODONE -acetaminophen  (PERCOCET) 10-325 MG tablet Take 1 tablet by mouth 2 (two) times daily as needed for pain.   pantoprazole  (PROTONIX ) 40 MG tablet TAKE 1 TABLET BY MOUTH EVERY DAY   pneumococcal 20-valent conjugate vaccine (PREVNAR 20) 0.5 ML injection Inject 0.5 mLs into the muscle once as needed for up to 1 dose for immunization.   potassium chloride  (KLOR-CON ) 10 MEQ tablet TAKE 4 TABLETS BY MOUTH TWICE DAILY   sertraline  (ZOLOFT ) 25 MG tablet Take 1 tablet by mouth daily for 1 week then increase to 2 tablets by mouth daily.   sodium chloride  (OCEAN) 0.65 % SOLN nasal spray Place 1 spray into both nostrils as needed for congestion.   traZODone  (DESYREL ) 50 MG tablet TAKE 1  TABLET BY MOUTH EVERY DAY AT BEDTIME   triamcinolone  cream (KENALOG ) 0.1 % Apply 1 Application topically 2 (two) times daily as needed (burning dry skin).   TURMERIC PO Take by mouth daily at 6 (six) AM.   No facility-administered encounter medications on file as of 12/28/2023.      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 s/p bioprosthetic valve replacement 2008 at [facility];  b. 01/2015 Echo: EF 60-65%, no rwma, Gr1 DD, mildly dil LA, nl RV fxn.   Aspiration pneumonia ([name])    Atrial fibrillation with RVR ([name]) 01/11/2015   Atrial flutter ([name])    a. 08/2016 s/p DCCV.  Remains  on flecainide  50 mg bid.   Basal skull fracture ([name]) 20 yrs ago   CHF (congestive heart failure) ([name])    Chronic respiratory failure ([name])    COPD (chronic obstructive pulmonary disease) ([name])    a. 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 2008 prior to Aortic aneurysm repair-->nl cors.   History of stress test    a. 10/2015 MV: no ischemia/infarct.   HLD (hyperlipidemia)    HTN (hypertension)    Hypothyroidism    Obesity    PAF (paroxysmal atrial fibrillation) ([name])    a. on Eliquis ; b. CHADS2VASc = 3 (HTN, age x 1, female).   Paroxysmal atrial fibrillation ([name]) 01/24/2015   Right upper quadrant abdominal tenderness without rebound tenderness 03/02/2018   S/P ascending aortic aneurysm repair 2008     Vital Signs: BP 116/79   Pulse 70   Temp (!) 97.1 F (36.2 C)   Resp 16   Ht 4\' 10"  (1.473 m)   Wt 193 lb 6.4 oz (87.7 kg)   SpO2 95% Comment: 5L  BMI 40.42 kg/m    Review of Systems  Constitutional:  Negative for fatigue and fever.  HENT:  Negative for congestion, mouth sores and postnasal drip.   Respiratory:  Positive for shortness of breath (on continuous oxygen , she has SOB at baseline). Negative for cough, chest tightness and wheezing.   Cardiovascular: Negative.  Negative for chest pain and palpitations.  Gastrointestinal: Negative.   Genitourinary:  Negative for flank pain.  Musculoskeletal:  Positive for arthralgias, back pain and myalgias.  Skin:        Burning raw feeling on the back of the right upper arm  Psychiatric/Behavioral: Negative.      Physical Exam Vitals reviewed.  Constitutional:      General: She is not in acute distress.    Appearance: Normal appearance. She is obese. She is not ill-appearing.  HENT:     Head: Normocephalic and atraumatic.  Eyes:     Pupils: Pupils are equal, round, and reactive to light.   Cardiovascular:     Rate and Rhythm: Normal rate and regular rhythm.  Pulmonary:     Effort: Pulmonary effort is normal. No respiratory distress.  Neurological:     Mental Status: She is alert and oriented to person, place, and time.  Psychiatric:        Mood and Affect: Mood normal.        Behavior: Behavior normal.       Assessment/Plan: 1. CHF (congestive heart failure), NYHA class IV, chronic, diastolic ([name]) (Primary) Referred  to nutritionist.   2. Dry skin dermatitis Will try topical steroid for this.  - triamcinolone  cream (KENALOG ) 0.1 %; Apply 1 Application topically 2 (two) times daily as needed (burning dry skin).  Dispense: 45 g; Refill: 0  3. Neuropathy, arm, right If the burning pain is not improved by the topical steroid, it may be neuropathy stemming from her chronic right shoulder pain so the next step is to refer her to orthopedic or neurology.   4. Chronic right shoulder pain May need orthopedic referral in the future  5. Class 3 severe obesity due to excess calories with serious comorbidity and body mass index (BMI) of 40.0 to 44.9 in adult [facility]) Referred to nutritionist per patient request - Amb ref to Medical Nutrition Therapy-MNT   General Counseling: Donja verbalizes understanding of the findings of todays visit and agrees with plan of treatment. I have discussed any further diagnostic evaluation that may be needed or ordered today. We also reviewed her medications today. she has been encouraged to call the office with any questions or concerns that should arise related to todays visit.    Counseling:    Orders Placed This Encounter  Procedures   Amb ref to Medical Nutrition Therapy-MNT    Meds ordered this encounter  Medications   triamcinolone  cream (KENALOG ) 0.1 %    Sig: Apply 1 Application topically 2 (two) times daily as needed (burning dry skin).    Dispense:  45 g    Refill:  0    Return if symptoms worsen or fail to  improve.  [place] Controlled Substance Database was reviewed by me for overdose risk score (ORS)  Time spent:30 Minutes Time spent with patient included reviewing progress notes, labs, imaging studies, and discussing plan for follow up.   This patient was seen by Laurence Pons, FNP-C in collaboration with Dr. Verneta Gone as a part of collaborative care agreement.  Vela Render R. Bobbi Burow, MSN, FNP-C Internal Medicine

## 2023-12-30 ENCOUNTER — Encounter: Payer: Self-pay | Admitting: Nurse Practitioner

## 2023-12-30 DIAGNOSIS — L853 Xerosis cutis: Secondary | ICD-10-CM | POA: Insufficient documentation

## 2023-12-30 DIAGNOSIS — G5691 Unspecified mononeuropathy of right upper limb: Secondary | ICD-10-CM | POA: Insufficient documentation

## 2024-01-01 ENCOUNTER — Encounter: Payer: Self-pay | Admitting: Nurse Practitioner

## 2024-01-01 DIAGNOSIS — J449 Chronic obstructive pulmonary disease, unspecified: Secondary | ICD-10-CM | POA: Diagnosis not present

## 2024-01-02 ENCOUNTER — Encounter: Payer: Self-pay | Admitting: Emergency Medicine

## 2024-01-02 ENCOUNTER — Other Ambulatory Visit: Payer: Self-pay

## 2024-01-02 ENCOUNTER — Emergency Department
Admission: EM | Admit: 2024-01-02 | Discharge: 2024-01-02 | Disposition: A | Discharge status: Home / Self Care | Attending: Emergency Medicine | Admitting: Emergency Medicine

## 2024-01-02 ENCOUNTER — Emergency Department

## 2024-01-02 DIAGNOSIS — I5030 Unspecified diastolic (congestive) heart failure: Secondary | ICD-10-CM | POA: Insufficient documentation

## 2024-01-02 DIAGNOSIS — M79601 Pain in right arm: Secondary | ICD-10-CM | POA: Diagnosis not present

## 2024-01-02 DIAGNOSIS — J90 Pleural effusion, not elsewhere classified: Secondary | ICD-10-CM | POA: Diagnosis not present

## 2024-01-02 DIAGNOSIS — J449 Chronic obstructive pulmonary disease, unspecified: Secondary | ICD-10-CM | POA: Diagnosis not present

## 2024-01-02 DIAGNOSIS — Z95828 Presence of other vascular implants and grafts: Secondary | ICD-10-CM | POA: Diagnosis not present

## 2024-01-02 DIAGNOSIS — C3490 Malignant neoplasm of unspecified part of unspecified bronchus or lung: Secondary | ICD-10-CM | POA: Diagnosis not present

## 2024-01-02 DIAGNOSIS — M19011 Primary osteoarthritis, right shoulder: Secondary | ICD-10-CM | POA: Diagnosis not present

## 2024-01-02 DIAGNOSIS — Z7901 Long term (current) use of anticoagulants: Secondary | ICD-10-CM | POA: Diagnosis not present

## 2024-01-02 DIAGNOSIS — M79621 Pain in right upper arm: Secondary | ICD-10-CM | POA: Diagnosis not present

## 2024-01-02 LAB — CBC WITH DIFFERENTIAL/PLATELET
Abs Immature Granulocytes: 0.02 10*3/uL (ref 0.00–0.07)
Basophils Absolute: 0 10*3/uL (ref 0.0–0.1)
Basophils Relative: 0 %
Eosinophils Absolute: 0.1 10*3/uL (ref 0.0–0.5)
Eosinophils Relative: 3 %
HCT: 34.3 % — ABNORMAL LOW (ref 36.0–46.0)
Hemoglobin: 10.9 g/dL — ABNORMAL LOW (ref 12.0–15.0)
Immature Granulocytes: 0 %
Lymphocytes Relative: 17 %
Lymphs Abs: 0.9 10*3/uL (ref 0.7–4.0)
MCH: 28.2 pg (ref 26.0–34.0)
MCHC: 31.8 g/dL (ref 30.0–36.0)
MCV: 88.9 fL (ref 80.0–100.0)
Monocytes Absolute: 0.8 10*3/uL (ref 0.1–1.0)
Monocytes Relative: 14 %
Neutro Abs: 3.4 10*3/uL (ref 1.7–7.7)
Neutrophils Relative %: 66 %
Platelets: 222 10*3/uL (ref 150–400)
RBC: 3.86 MIL/uL — ABNORMAL LOW (ref 3.87–5.11)
RDW: 16 % — ABNORMAL HIGH (ref 11.5–15.5)
WBC: 5.3 10*3/uL (ref 4.0–10.5)
nRBC: 0 % (ref 0.0–0.2)

## 2024-01-02 LAB — MAGNESIUM: Magnesium: 2.2 mg/dL (ref 1.7–2.4)

## 2024-01-02 LAB — COMPREHENSIVE METABOLIC PANEL WITH GFR
ALT: 14 U/L (ref 0–44)
AST: 20 U/L (ref 15–41)
Albumin: 3.6 g/dL (ref 3.5–5.0)
Alkaline Phosphatase: 64 U/L (ref 38–126)
Anion gap: 4 — ABNORMAL LOW (ref 5–15)
BUN: 20 mg/dL (ref 8–23)
CO2: 29 mmol/L (ref 22–32)
Calcium: 8.9 mg/dL (ref 8.9–10.3)
Chloride: 99 mmol/L (ref 98–111)
Creatinine, Ser: 1.01 mg/dL — ABNORMAL HIGH (ref 0.44–1.00)
GFR, Estimated: 58 mL/min — ABNORMAL LOW (ref >60)
Glucose, Bld: 102 mg/dL — ABNORMAL HIGH (ref 70–99)
Potassium: 4.1 mmol/L (ref 3.5–5.1)
Sodium: 132 mmol/L — ABNORMAL LOW (ref 135–145)
Total Bilirubin: 0.4 mg/dL (ref 0.0–1.2)
Total Protein: 6.9 g/dL (ref 6.5–8.1)

## 2024-01-02 LAB — TROPONIN I (HIGH SENSITIVITY): Troponin I (High Sensitivity): 8 ng/L (ref <18)

## 2024-01-02 MED ORDER — GABAPENTIN 100 MG PO CAPS: 100.0000 mg | ORAL_CAPSULE | Freq: Two times a day (BID) | ORAL | 0 refills | Status: DC

## 2024-01-02 MED ORDER — ACETAMINOPHEN 500 MG PO TABS
1000.0000 mg | ORAL_TABLET | Freq: Once | ORAL | Status: DC
  Filled 2024-01-02: qty 2

## 2024-01-02 MED ORDER — GABAPENTIN 100 MG PO CAPS
100.0000 mg | ORAL_CAPSULE | Freq: Once | ORAL | Status: DC
  Filled 2024-01-02: qty 1

## 2024-01-02 NOTE — ED Triage Notes (Signed)
 Patient to ED via POV for right arm pain. Pt states she has had burning in her arm and is concerned for "internal shingles." Wears 5L [place] at baseline.

## 2024-01-02 NOTE — ED Provider Notes (Signed)
 [facility] Provider Note    Event Date/Time   First MD Initiated Contact with Patient 01/02/24 1237     (approximate)   History   Arm Pain   HPI  [name] is a 78 y.o. female who presents to the ED for evaluation of Arm Pain   Review of cardiology clinic visit from 4/1.  A-fib s/p AV nodal ablation and dual-chamber pacer 2021.  COPD and diastolic CHF on chronic 4 L.  TAVR 1 year ago.  Remains on Eliquis . Also review a PCP acute visit from 5 days ago where she was reporting burning to the posterior right upper arm.  Patient presents with chronic atraumatic burning sensation into the posterior aspect of the right upper arm.  No additional injuries or events, pain has been present for at least 1 month.  Only to the posterior aspect of her right upper arm, nothing more proximal near the shoulder, nothing distal to the elbow.  Only to the posterior aspect.  Right hand is still functioning normally   Physical Exam   Triage Vital Signs: ED Triage Vitals [01/02/24 1232]  Encounter Vitals Group     BP 107/61     Systolic BP Percentile      Diastolic BP Percentile      Pulse Rate 71     Resp 17     Temp 97.8 F (36.6 C)     Temp Source Oral     SpO2 92 %     Weight 191 lb 12.8 oz (87 kg)     Height 4\' 10"  (1.473 m)     Head Circumference      Peak Flow      Pain Score 7     Pain Loc      Pain Education      Exclude from Growth Chart     Most recent vital signs: Vitals:   01/02/24 1241 01/02/24 1245  BP: 125/67   Pulse: 68 70  Resp:  15  Temp:    SpO2: 96% 97%    General: Awake, no distress.  CV:  Good peripheral perfusion.  Resp:  Normal effort.  Abd:  No distention.  MSK:  No deformity noted.  Neuro:  No focal deficits appreciated. Other:  Hyperalgesic to the area of concern without skin changes, rash, masses, induration or other apparent pathologies.  Distal to this, to the right hand, she is neurovascularly intact   ED Results  / Procedures / Treatments   Labs (all labs ordered are listed, but only abnormal results are displayed) Labs Reviewed  COMPREHENSIVE METABOLIC PANEL WITH GFR - Abnormal; Notable for the following components:      Result Value   Sodium 132 (*)    Glucose, Bld 102 (*)    Creatinine, Ser 1.01 (*)    GFR, Estimated 58 (*)    Anion gap 4 (*)    All other components within normal limits  CBC WITH DIFFERENTIAL/PLATELET - Abnormal; Notable for the following components:   RBC 3.86 (*)    Hemoglobin 10.9 (*)    HCT 34.3 (*)    RDW 16.0 (*)    All other components within normal limits  MAGNESIUM   TROPONIN I (HIGH SENSITIVITY)  TROPONIN I (HIGH SENSITIVITY)    EKG Sinus rhythm with a rate of 70 bpm.  Normal axis.  Left bundle morphology.  No clear signs of acute ischemia by Sgarbossa criteria.  RADIOLOGY Plain film of the right humerus  interpreted by me without signs of fracture or dislocation  Official radiology report(s): No results found.  PROCEDURES and INTERVENTIONS:  Procedures  Medications  gabapentin  (NEURONTIN ) capsule 100 mg (100 mg Oral Patient Refused/Not Given 01/02/24 1414)  acetaminophen  (TYLENOL ) tablet 1,000 mg (1,000 mg Oral Patient Refused/Not Given 01/02/24 1414)     IMPRESSION / MDM / ASSESSMENT AND PLAN / ED COURSE  I reviewed the triage vital signs and the nursing notes.  Differential diagnosis includes, but is not limited to, neuropathy, shingles, cellulitis, arthritis  {Patient presents with symptoms of an acute illness or injury that is potentially life-threatening.  Patient presents with chronic atraumatic burning sensation to her posterior right upper arm without evidence of acute life-threatening pathology and suitable for continued outpatient management with the addition of gabapentin  to her regimen.  Look systemically well with baseline respiratory failure on 3-4 L O2.  No chest pain, dyspnea or symptoms beyond isolated burning to the arm.  Arm and  hand are neurovascularly intact, frequently sitting upright and playing on her phone during our conversations.  Mild hyponatremia that I doubt is relevant to her symptoms.  Otherwise normal electrolytes, negative troponin.  X-rays are reassuring.  Provided prescription for gabapentin , discussed ED  Clinical Course as of 01/02/24 1447  Sun Jan 02, 2024  1421 Patient refusing all pain medications [DS]  1438 Reassessed.  Patient reports hesitation taking gabapentin  as she takes it every night already.  Does acknowledge that she is able to sleep through the night and arm does not hurt until morning.  We discussed trying this medication to help with her chronic burning pain to her right arm.  We discussed PCP follow-up and appropriate ED return precautions.  She was appreciative and agreeable.  Suitable for outpatient management [DS]    Clinical Course User Index [DS] Arline Bennett, MD     FINAL CLINICAL IMPRESSION(S) / ED DIAGNOSES   Final diagnoses:  Right arm pain     Rx / DC Orders   ED Discharge Orders          Ordered    gabapentin  (NEURONTIN ) 100 MG capsule  2 times daily        01/02/24 1446             Note:  This document was prepared using Dragon voice recognition software and may include unintentional dictation errors.   Arline Bennett, MD 01/02/24 [phone]

## 2024-01-02 NOTE — Discharge Instructions (Signed)
 Try the gabapentin  through the daytime as well as at night.  You can take this medicine up to 3 times per day, separated by 8 hours.  Follow-up with your PCP for recheck.  Return to the ED with any worsening symptoms despite these measures.

## 2024-01-03 ENCOUNTER — Other Ambulatory Visit: Payer: Self-pay | Admitting: Nurse Practitioner

## 2024-01-03 MED ORDER — VALACYCLOVIR HCL 1 G PO TABS: 1000.0000 mg | ORAL_TABLET | Freq: Two times a day (BID) | ORAL | 0 refills | Status: AC

## 2024-01-06 ENCOUNTER — Telehealth: Payer: Self-pay | Admitting: Nurse Practitioner

## 2024-01-06 NOTE — Telephone Encounter (Signed)
Lvm to scheduled ED follow up-Toni

## 2024-01-24 ENCOUNTER — Ambulatory Visit
Admission: RE | Admit: 2024-01-24 | Discharge: 2024-01-24 | Disposition: A | Discharge status: Home / Self Care | Payer: Medicare Other | Source: Ambulatory Visit | Attending: Radiation Oncology | Admitting: Radiation Oncology

## 2024-01-24 DIAGNOSIS — R918 Other nonspecific abnormal finding of lung field: Secondary | ICD-10-CM | POA: Diagnosis not present

## 2024-01-24 DIAGNOSIS — C3490 Malignant neoplasm of unspecified part of unspecified bronchus or lung: Secondary | ICD-10-CM | POA: Diagnosis not present

## 2024-01-24 DIAGNOSIS — R590 Localized enlarged lymph nodes: Secondary | ICD-10-CM | POA: Diagnosis not present

## 2024-01-24 DIAGNOSIS — J432 Centrilobular emphysema: Secondary | ICD-10-CM | POA: Diagnosis not present

## 2024-01-24 MED ORDER — IOHEXOL 300 MG/ML  SOLN
75.0000 mL | Freq: Once | INTRAMUSCULAR | Status: AC | PRN
  Administered 2024-01-24: 75 mL via INTRAVENOUS

## 2024-01-31 DIAGNOSIS — J449 Chronic obstructive pulmonary disease, unspecified: Secondary | ICD-10-CM | POA: Diagnosis not present

## 2024-01-31 DIAGNOSIS — G4733 Obstructive sleep apnea (adult) (pediatric): Secondary | ICD-10-CM | POA: Diagnosis not present

## 2024-02-03 ENCOUNTER — Other Ambulatory Visit: Payer: Self-pay | Admitting: *Deleted

## 2024-02-03 ENCOUNTER — Ambulatory Visit: Payer: Medicare Other | Admitting: Radiation Oncology

## 2024-02-03 DIAGNOSIS — R918 Other nonspecific abnormal finding of lung field: Secondary | ICD-10-CM

## 2024-02-07 ENCOUNTER — Other Ambulatory Visit: Payer: Self-pay

## 2024-02-07 DIAGNOSIS — R61 Generalized hyperhidrosis: Secondary | ICD-10-CM

## 2024-02-07 MED ORDER — GABAPENTIN 100 MG PO CAPS: ORAL_CAPSULE | ORAL | 1 refills | Status: DC

## 2024-02-08 ENCOUNTER — Other Ambulatory Visit: Payer: Self-pay

## 2024-02-08 DIAGNOSIS — R61 Generalized hyperhidrosis: Secondary | ICD-10-CM

## 2024-02-08 MED ORDER — GABAPENTIN 100 MG PO CAPS: ORAL_CAPSULE | ORAL | 1 refills | Status: DC

## 2024-02-17 ENCOUNTER — Ambulatory Visit (INDEPENDENT_AMBULATORY_CARE_PROVIDER_SITE_OTHER): Admitting: Nurse Practitioner

## 2024-02-17 ENCOUNTER — Encounter: Payer: Self-pay | Admitting: Nurse Practitioner

## 2024-02-17 VITALS — BP 112/60 | HR 72 | Temp 98.2°F | Resp 16 | Ht <= 58 in | Wt 192.8 lb

## 2024-02-17 DIAGNOSIS — G5691 Unspecified mononeuropathy of right upper limb: Secondary | ICD-10-CM | POA: Diagnosis not present

## 2024-02-17 DIAGNOSIS — M25512 Pain in left shoulder: Secondary | ICD-10-CM

## 2024-02-17 DIAGNOSIS — F411 Generalized anxiety disorder: Secondary | ICD-10-CM

## 2024-02-17 DIAGNOSIS — A630 Anogenital (venereal) warts: Secondary | ICD-10-CM

## 2024-02-17 DIAGNOSIS — G8929 Other chronic pain: Secondary | ICD-10-CM

## 2024-02-17 DIAGNOSIS — M25511 Pain in right shoulder: Secondary | ICD-10-CM

## 2024-02-17 DIAGNOSIS — J9611 Chronic respiratory failure with hypoxia: Secondary | ICD-10-CM

## 2024-02-17 MED ORDER — ALPRAZOLAM 0.25 MG PO TABS
0.2500 mg | ORAL_TABLET | Freq: Two times a day (BID) | ORAL | 2 refills | Status: DC | PRN
Start: 2024-02-17 — End: 2024-05-10

## 2024-02-17 MED ORDER — OXYCODONE-ACETAMINOPHEN 10-325 MG PO TABS: 1.0000 | ORAL_TABLET | Freq: Two times a day (BID) | ORAL | 0 refills | Status: DC | PRN

## 2024-02-17 NOTE — Progress Notes (Signed)
 [facility] [address] [place], [place] [address]  Internal MEDICINE  Office Visit Note  Patient Name: [name]  [id number]  [id number]  Date of Service: 02/17/2024  Chief Complaint  Patient presents with   Hypertension   Hyperlipidemia   Follow-up    HPI Aubrianne presents for a follow-up visit for neuropathy, hot flashes, anxiety, chronic shoulder pain, and genital warts. Neuropathy of right upper extremity Hot flashes Painless vaginal lesions -- had genital warts surgically removed about 32 years ago.  Chronic bilateral shoulder pain and low back pain  Anxiety -- takes alprazolam  as needed.    Current Medication: Outpatient Encounter Medications as of 02/17/2024  Medication Sig   albuterol  (VENTOLIN  HFA) 108 (90 Base) MCG/ACT inhaler Inhale 2 puffs into the lungs every 6 (six) hours as needed for wheezing or shortness of breath.   ALPRAZolam  (XANAX ) 0.25 MG tablet Take 1 tablet (0.25 mg total) by mouth 2 (two) times daily as needed for anxiety.   bumetanide  (BUMEX ) 1 MG tablet TAKE 2 TABLET BY MOUTH TWICE DAILY, ADDITIONAL 2 TABLETS IN THE EVENING AS NEEDED   Calcium  Carbonate-Vitamin D  (CALCIUM  600+D PO) Take 1 tablet by mouth daily.   carvedilol  (COREG ) 25 MG tablet Take 25 mg by mouth 2 (two) times daily with a meal.   Cholecalciferol  (VITAMIN D3) 10 MCG (400 UNIT) CAPS Take by mouth daily.   Coenzyme Q10 (COQ10) 200 MG CAPS Take 200 mg by mouth daily.   diclofenac  Sodium (VOLTAREN ) 1 % GEL Apply 2 g topically 4 (four) times daily.   ELIQUIS  5 MG TABS tablet TAKE 1 TABLET BY MOUTH TWICE DAILY   empagliflozin  (JARDIANCE ) 10 MG TABS tablet Take 1 tablet (10 mg total) by mouth daily.   levothyroxine  (SYNTHROID ) 25 MCG tablet TAKE 1 TABLET BY MOUTH DAILY BEFORE BREAKFAST   lisinopril  (ZESTRIL ) 40 MG tablet TAKE 1 TABLET(40 MG) BY MOUTH DAILY   loratadine  (CLARITIN ) 10 MG tablet Take 10 mg by mouth daily.   [START ON 04/13/2024] oxyCODONE -acetaminophen   (PERCOCET) 10-325 MG tablet Take 1 tablet by mouth 2 (two) times daily as needed for pain.   oxyCODONE -acetaminophen  (PERCOCET) 10-325 MG tablet Take 1 tablet by mouth 2 (two) times daily as needed for pain.   oxyCODONE -acetaminophen  (PERCOCET) 10-325 MG tablet Take 1 tablet by mouth 2 (two) times daily as needed for pain.   pantoprazole  (PROTONIX ) 40 MG tablet TAKE 1 TABLET BY MOUTH EVERY DAY   pneumococcal 20-valent conjugate vaccine (PREVNAR 20) 0.5 ML injection Inject 0.5 mLs into the muscle once as needed for up to 1 dose for immunization.   potassium chloride  (KLOR-CON ) 10 MEQ tablet TAKE 4 TABLETS BY MOUTH TWICE DAILY   sertraline  (ZOLOFT ) 25 MG tablet Take 1 tablet by mouth daily for 1 week then increase to 2 tablets by mouth daily.   sodium chloride  (OCEAN) 0.65 % SOLN nasal spray Place 1 spray into both nostrils as needed for congestion.   traZODone  (DESYREL ) 50 MG tablet TAKE 1 TABLET BY MOUTH EVERY DAY AT BEDTIME   triamcinolone  cream (KENALOG ) 0.1 % Apply 1 Application topically 2 (two) times daily as needed (burning dry skin).   TURMERIC PO Take by mouth daily at 6 (six) AM.   [DISCONTINUED] ALPRAZolam  (XANAX ) 0.25 MG tablet Take 1 tablet (0.25 mg total) by mouth 2 (two) times daily as needed for anxiety.   [DISCONTINUED] atorvastatin  (LIPITOR) 40 MG tablet TAKE 1 TABLET BY MOUTH EVERY NIGHT AT BEDTIME   [DISCONTINUED] BREZTRI  AEROSPHERE  160-9-4.8 MCG/ACT AERO INHALE 2 PUFFS INTO THE LUNGS TWICE DAILY   [DISCONTINUED] gabapentin  (NEURONTIN ) 100 MG capsule TAKE 1 CAPSULE(100 MG) BY MOUTH AT BEDTIME   [DISCONTINUED] gabapentin  (NEURONTIN ) 100 MG capsule Take 1 capsule (100 mg total) by mouth 2 (two) times daily.   [DISCONTINUED] oxyCODONE -acetaminophen  (PERCOCET) 10-325 MG tablet Take 1 tablet by mouth 2 (two) times daily as needed for pain.   [DISCONTINUED] oxyCODONE -acetaminophen  (PERCOCET) 10-325 MG tablet Take 1 tablet by mouth 2 (two) times daily as needed for pain.    [DISCONTINUED] oxyCODONE -acetaminophen  (PERCOCET) 10-325 MG tablet Take 1 tablet by mouth 2 (two) times daily as needed for pain.   No facility-administered encounter medications on file as of 02/17/2024.    Surgical History: Past Surgical History:  Procedure Laterality Date   ABDOMINAL AORTIC ANEURYSM REPAIR  2008   ABDOMINAL HYSTERECTOMY     AORTIC VALVE REPLACEMENT  2008   CARDIAC CATHETERIZATION     [facility]   CARDIOVERSION N/A 08/15/2018   Procedure: CARDIOVERSION;  Surgeon: Darron Deatrice LABOR, MD;  Location: [facility] ORS;  Service: Cardiovascular;  Laterality: N/A;   CARDIOVERSION N/A 11/14/2018   Procedure: CARDIOVERSION (CATH LAB);  Surgeon: Darron Deatrice LABOR, MD;  Location: [facility] ORS;  Service: Cardiovascular;  Laterality: N/A;   CARDIOVERSION N/A 06/05/2019   Procedure: CARDIOVERSION;  Surgeon: Darron Deatrice LABOR, MD;  Location: [facility] ORS;  Service: Cardiovascular;  Laterality: N/A;   CARDIOVERSION N/A 08/14/2019   Procedure: CARDIOVERSION;  Surgeon: Darron Deatrice LABOR, MD;  Location: [facility] ORS;  Service: Cardiovascular;  Laterality: N/A;   CARDIOVERSION N/A 11/09/2019   Procedure: CARDIOVERSION;  Surgeon: Ammon Blunt, MD;  Location: [facility] ORS;  Service: Cardiovascular;  Laterality: N/A;   CARDIOVERSION N/A 01/17/2020   Procedure: CARDIOVERSION;  Surgeon: Ammon Blunt, MD;  Location: [facility] ORS;  Service: Cardiovascular;  Laterality: N/A;   CARPAL TUNNEL RELEASE     ELECTROPHYSIOLOGIC STUDY N/A 08/17/2016   Procedure: Cardioversion;  Surgeon: Deatrice LABOR Darron, MD;  Location: [facility] ORS;  Service: Cardiovascular;  Laterality: N/A;   TUMOR EXCISION Left    x3 (arm)    Medical History: Past Medical History:  Diagnosis Date   Aortic 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 s/p bioprosthetic valve replacement 2008 at [facility];  b. 01/2015 Echo: EF 60-65%, no rwma, Gr1 DD, mildly dil LA, nl RV fxn.   Aspiration pneumonia ([name])    Atrial fibrillation with RVR ([name]) 01/11/2015   Atrial  flutter ([name])    a. 08/2016 s/p DCCV.  Remains on flecainide  50 mg bid.   Basal skull fracture ([name]) 20 yrs ago   CHF (congestive heart failure) ([name])    Chronic respiratory failure ([name])    COPD (chronic obstructive pulmonary disease) ([name])    a. 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 2008 prior to Aortic aneurysm repair-->nl cors.   History of stress test    a. 10/2015 MV: no ischemia/infarct.   HLD (hyperlipidemia)    HTN (hypertension)    Hypothyroidism    Obesity    PAF (paroxysmal atrial fibrillation) ([name])    a. on Eliquis ; b. CHADS2VASc = 3 (HTN, age x 1, female).   Paroxysmal atrial fibrillation ([name]) 01/24/2015   Right upper quadrant abdominal tenderness without rebound tenderness 03/02/2018   S/P ascending aortic aneurysm repair 2008  Family History: Family History  Problem Relation Age of Onset   Stroke Father    Stroke Paternal Grandmother    Breast cancer Neg Hx     Social History   Socioeconomic History   Marital status: Divorced    Spouse name: Not on file   Number of children: Not on file   Years of education: Not on file   Highest education level: Not on file  Occupational History   Not on file  Tobacco Use   Smoking status: Former    Types: Cigarettes   Smokeless tobacco: Never  Vaping Use   Vaping status: Never Used  Substance and Sexual Activity   Alcohol use: No   Drug use: No   Sexual activity: Not Currently    Birth control/protection: Surgical  Other Topics Concern   Not on file  Social History Narrative   Not on file   Social Drivers of Health   Financial Resource Strain: Low Risk  (11/03/2022)   Received from [facility]   Overall Financial Resource Strain (CARDIA)    Difficulty of Paying Living Expenses: Not hard at all  Recent Concern: Financial Resource Strain - Medium Risk (11/02/2022)    Received from [facility]   Overall Financial Resource Strain (CARDIA)    Difficulty of Paying Living Expenses: Somewhat hard  Food Insecurity: Food Insecurity Present (04/02/2023)   Hunger Vital Sign    Worried About Running Out of Food in the Last Year: Sometimes true    Ran Out of Food in the Last Year: Sometimes true  Transportation Needs: No Transportation Needs (02/02/2023)   PRAPARE - Administrator, Civil Service (Medical): No    Lack of Transportation (Non-Medical): No  Physical Activity: Not on file  Stress: Not on file  Social Connections: Not on file  Intimate Partner Violence: Not At Risk (04/02/2023)   Humiliation, Afraid, Rape, and Kick questionnaire    Fear of Current or Ex-Partner: No    Emotionally Abused: No    Physically Abused: No    Sexually Abused: No      Review of Systems  Constitutional:  Negative for fatigue and fever.  HENT:  Negative for congestion, mouth sores and postnasal drip.   Respiratory:  Positive for shortness of breath (on continuous oxygen , she has SOB at baseline). Negative for cough, chest tightness and wheezing.   Cardiovascular: Negative.  Negative for chest pain and palpitations.  Gastrointestinal: Negative.   Genitourinary:  Positive for genital sores (painless gental wart). Negative for flank pain.  Musculoskeletal:  Positive for arthralgias, back pain and myalgias.  Skin:        Burning raw feeling on the back of the right upper arm  Psychiatric/Behavioral: Negative.      Vital Signs: BP 112/60   Pulse 72   Temp 98.2 F (36.8 C)   Resp 16   Ht 4' 10 (1.473 m)   Wt 192 lb 12.8 oz (87.5 kg)   SpO2 93% Comment: 5L  BMI 40.30 kg/m    Physical Exam Vitals reviewed.  Constitutional:      General: She is not in acute distress.    Appearance: Normal appearance. She is obese. She is not ill-appearing.  HENT:     Head: Normocephalic and atraumatic.     Mouth/Throat:     Mouth: Mucous membranes  are moist.  Eyes:     Pupils: Pupils are equal, round, and reactive to light.  Cardiovascular:  Rate and Rhythm: Normal rate and regular rhythm.  Pulmonary:     Effort: Pulmonary effort is normal. No respiratory distress.  Neurological:     Mental Status: She is alert and oriented to person, place, and time.  Psychiatric:        Mood and Affect: Mood normal.        Behavior: Behavior normal.        Assessment/Plan: 1. Chronic respiratory failure with hypoxia ([name]) (Primary) Continue medications as prescribed and continue to use supplemental oxygen  as instructed.   2. Chronic pain of both shoulders Continue prn percocet as prescribed, follow up in 3 months for additional refills.  - oxyCODONE -acetaminophen  (PERCOCET) 10-325 MG tablet; Take 1 tablet by mouth 2 (two) times daily as needed for pain.  Dispense: 60 tablet; Refill: 0 - oxyCODONE -acetaminophen  (PERCOCET) 10-325 MG tablet; Take 1 tablet by mouth 2 (two) times daily as needed for pain.  Dispense: 60 tablet; Refill: 0 - oxyCODONE -acetaminophen  (PERCOCET) 10-325 MG tablet; Take 1 tablet by mouth 2 (two) times daily as needed for pain.  Dispense: 60 tablet; Refill: 0  3. Genital warts Referred to OBGYN - Ambulatory referral to Obstetrics / Gynecology  4. Neuropathy, arm, right Continue gabapentin  as prescribed.   5. GAD (generalized anxiety disorder) Continue prn alprazolam  as prescribed, follow up in 3 months for additional refills.  - ALPRAZolam  (XANAX ) 0.25 MG tablet; Take 1 tablet (0.25 mg total) by mouth 2 (two) times daily as needed for anxiety.  Dispense: 60 tablet; Refill: 2   General Counseling: Tishanna verbalizes understanding of the findings of todays visit and agrees with plan of treatment. I have discussed any further diagnostic evaluation that may be needed or ordered today. We also reviewed her medications today. she has been encouraged to call the office with any questions or concerns that should arise  related to todays visit.    Orders Placed This Encounter  Procedures   Ambulatory referral to Obstetrics / Gynecology    Meds ordered this encounter  Medications   oxyCODONE -acetaminophen  (PERCOCET) 10-325 MG tablet    Sig: Take 1 tablet by mouth 2 (two) times daily as needed for pain.    Dispense:  60 tablet    Refill:  0    Refill for august   oxyCODONE -acetaminophen  (PERCOCET) 10-325 MG tablet    Sig: Take 1 tablet by mouth 2 (two) times daily as needed for pain.    Dispense:  60 tablet    Refill:  0    Refill for july   oxyCODONE -acetaminophen  (PERCOCET) 10-325 MG tablet    Sig: Take 1 tablet by mouth 2 (two) times daily as needed for pain.    Dispense:  60 tablet    Refill:  0    Refill for june   ALPRAZolam  (XANAX ) 0.25 MG tablet    Sig: Take 1 tablet (0.25 mg total) by mouth 2 (two) times daily as needed for anxiety.    Dispense:  60 tablet    Refill:  2    For future refills    Return in about 3 months (around 05/10/2024) for F/U, anxiety med refill, pain med refill, Saraya Tirey PCP.   Total time spent:30 Minutes Time spent includes review of chart, medications, test results, and follow up plan with the patient.   [place] Controlled Substance Database was reviewed by me.  This patient was seen by Mardy Maxin, FNP-C in collaboration with Dr. Sigrid Bathe as a part of collaborative care agreement.   Kale Dols R.  Liana, MSN, FNP-C Internal medicine

## 2024-03-02 DIAGNOSIS — J449 Chronic obstructive pulmonary disease, unspecified: Secondary | ICD-10-CM | POA: Diagnosis not present

## 2024-03-02 DIAGNOSIS — G4733 Obstructive sleep apnea (adult) (pediatric): Secondary | ICD-10-CM | POA: Diagnosis not present

## 2024-03-05 NOTE — Progress Notes (Unsigned)
 Andrea Fish, NP   No chief complaint on file.   HPI:      [name] is a 78 y.o. No obstetric history on file. whose LMP was No LMP recorded. Patient has had a hysterectomy., presents today for NP eval painful vaginal lesions, referred by PCP. 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/22/2022   CHF (congestive heart failure), NYHA class IV, chronic, diastolic ([name]) 10/22/2022   On home oxygen  therapy 10/22/2022   Hypokalemia 08/04/2022   Chronic upper back pain 04/14/2022   Chronic myofascial pain 04/14/2022   Musculoskeletal disorder involving upper trapezius muscle 04/14/2022   Abnormal CT scan, cervical spine (03/04/2022) 04/01/2022   Trigger point of shoulder region (Bilateral) 04/01/2022   Trigger point with neck pain 04/01/2022   DISH (diffuse idiopathic skeletal hyperostosis) 03/12/2022   Atrial fibrillation, chronic ([name]) 01/26/2022   Morbid obesity ([name]) 01/26/2022   Chronic respiratory failure with hypoxia ([name]) 01/26/2022   Bilateral pneumonia 01/25/2022   Chronic intractable headache 01/07/2022   Chronic diastolic CHF (congestive heart failure) ([name]) 12/02/2021   Hyperlipidemia 12/02/2021   Stroke ([name]) 12/02/2021   Iron deficiency anemia 12/02/2021   Depression 12/02/2021   Acute on chronic respiratory failure with hypoxia ([name]) 11/11/2021   Unable to tolerate prone position due to orthopnea 10/16/2021    Orthopnea (Positional) 10/16/2021   Class 3 severe obesity due to excess calories with serious comorbidity and body mass index (BMI) of 40.0 to 44.9 in adult 10/16/2021   At high risk for postoperative complications 10/16/2021   Hearing loss 08/12/2021   Long term prescription benzodiazepine use 04/30/2021   Chronic shoulder pain (1ry area of Pain) (Bilateral) (L>R) 04/30/2021   Chronic right shoulder pain 04/30/2021   Cervicalgia 04/30/2021   Chronic neck pain (3ry area of Pain) (Posterior) (Bilateral) (L>R) 04/30/2021   Shoulder blade pain (4th area of Pain) (Left) 04/30/2021   Osteoarthritis of glenohumeral joint (Left) 04/30/2021   Osteoarthritis of AC (acromioclavicular) joint (Left) 04/30/2021   Osteoarthritis of glenohumeral joints (Bilateral) 04/30/2021   Osteoarthritis of acromioclavicular joints (Bilateral) 04/30/2021   Primary osteoarthritis of shoulders (Bilateral) 04/30/2021   DDD (degenerative disc disease), cervical 04/30/2021   Cervical radiculitis (Left) 04/30/2021   Cervical radiculopathy (Left) 04/30/2021   C6 radiculopathy (Left) 04/30/2021   C7 radiculopathy (Left) 04/30/2021   Wheelchair dependence 04/30/2021   Chronic pain syndrome 04/29/2021   Pharmacologic therapy 04/29/2021   Disorder of skeletal system 04/29/2021   Problems influencing health status 04/29/2021   Chronic anticoagulation (Eliquis ) 03/14/2021   Hx of atrioventricular node ablation 03/14/2021   Symptomatic anemia 01/29/2021   Heart failure due to valvular disease, acute on chronic, diastolic ([name]) 05/11/2020   Chronic obstructive pulmonary disease (COPD) ([name])    Hypothyroidism    Cardiac pacemaker in situ 05/10/2020   Acute non-recurrent frontal sinusitis 04/22/2020   Vertigo 04/22/2020   S/P placement of cardiac pacemaker 02/21/2020  Atrial fibrillation status post cardioversion [facility]) 11/16/2019   Episode of moderate major depression ([name]) 10/10/2019   Persistent atrial fibrillation  ([name])    Encounter for general adult medical examination with abnormal findings 07/10/2019   Encounter for screening mammogram for malignant neoplasm of breast 07/10/2019   Atopic dermatitis 05/02/2019   Paroxysmal atrial flutter ([name]) 08/11/2018   Encounter for long-term (current) use of medications 07/09/2018   Chronic shoulder pain (Left) 07/09/2018   Conjunctivitis 07/09/2018   Oxygen  dependent 07/09/2018   GAD (generalized anxiety disorder) 07/09/2018   Ovarian failure 07/09/2018   Positive colorectal cancer screening using Cologuard test 04/24/2018   Calculus of gallbladder with acute on chronic cholecystitis 04/08/2018   H/O: CVA (cerebrovascular accident) 04/08/2018   Dysuria 03/02/2018   Right upper quadrant abdominal tenderness without rebound tenderness 03/02/2018   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 exacerbation ([name]) 03/02/2018   Chronic obstructive pulmonary disease ([name]) 09/19/2017   Typical atrial flutter ([name])    Irritable bowel syndrome without diarrhea 01/29/2015   Hypertension 01/24/2015   Paroxysmal atrial fibrillation ([name]) 01/24/2015   History of aortic valve replacement with bioprosthetic valve 01/24/2015   Atrial fibrillation with RVR ([name]) 01/11/2015   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 05/18/2014   Shoulder impingement syndrome (Left) 05/18/2014   Cellulitis and abscess 02/13/2014   MRSA (methicillin resistant Staphylococcus aureus) 02/13/2014   Aneurysm, ascending aorta ([name]) 06/23/2013   Bicuspid aortic valve 06/23/2013    Past Surgical History:  Procedure Laterality Date   ABDOMINAL AORTIC ANEURYSM REPAIR  2008   ABDOMINAL HYSTERECTOMY     AORTIC VALVE REPLACEMENT  2008   CARDIAC CATHETERIZATION     [facility]   CARDIOVERSION N/A 08/15/2018   Procedure: CARDIOVERSION;  Surgeon: Darron Deatrice LABOR, MD;  Location: [facility] ORS;  Service: Cardiovascular;  Laterality: N/A;   CARDIOVERSION N/A 11/14/2018   Procedure: CARDIOVERSION (CATH  LAB);  Surgeon: Darron Deatrice LABOR, MD;  Location: [facility] ORS;  Service: Cardiovascular;  Laterality: N/A;   CARDIOVERSION N/A 06/05/2019   Procedure: CARDIOVERSION;  Surgeon: Darron Deatrice LABOR, MD;  Location: [facility] ORS;  Service: Cardiovascular;  Laterality: N/A;   CARDIOVERSION N/A 08/14/2019   Procedure: CARDIOVERSION;  Surgeon: Darron Deatrice LABOR, MD;  Location: [facility] ORS;  Service: Cardiovascular;  Laterality: N/A;   CARDIOVERSION N/A 11/09/2019   Procedure: CARDIOVERSION;  Surgeon: Ammon Blunt, MD;  Location: [facility] ORS;  Service: Cardiovascular;  Laterality: N/A;   CARDIOVERSION N/A 01/17/2020   Procedure: CARDIOVERSION;  Surgeon: Ammon Blunt, MD;  Location: [facility] ORS;  Service: Cardiovascular;  Laterality: N/A;   CARPAL TUNNEL RELEASE     ELECTROPHYSIOLOGIC STUDY N/A 08/17/2016   Procedure: Cardioversion;  Surgeon: Deatrice LABOR Darron, MD;  Location: [facility] ORS;  Service: Cardiovascular;  Laterality: N/A;   TUMOR EXCISION Left    x3 (arm)    Family History  Problem Relation Age of Onset   Stroke Father    Stroke Paternal Grandmother    Breast cancer Neg Hx     Social History   Socioeconomic History   Marital status: Divorced    Spouse name: Not on file   Number of children: Not on file   Years of education: Not on file   Highest education level: Not on file  Occupational History   Not on file  Tobacco Use   Smoking status: Former    Types: Cigarettes   Smokeless tobacco: Never  Vaping Use   Vaping status: Never Used  Substance and Sexual Activity   Alcohol use:  No   Drug use: No   Sexual activity: Never    Birth control/protection: Surgical  Other Topics Concern   Not on file  Social History Narrative   Not on file   Social Drivers of Health   Financial Resource Strain: Low Risk  (11/03/2022)   Received from [facility]   Overall Financial Resource Strain (CARDIA)    Difficulty of Paying Living Expenses: Not hard at all  Recent Concern:  Financial Resource Strain - Medium Risk (11/02/2022)   Received from [facility]   Overall Financial Resource Strain (CARDIA)    Difficulty of Paying Living Expenses: Somewhat hard  Food Insecurity: Food Insecurity Present (04/02/2023)   Hunger Vital Sign    Worried About Running Out of Food in the Last Year: Sometimes true    Ran Out of Food in the Last Year: Sometimes true  Transportation Needs: No Transportation Needs (02/02/2023)   PRAPARE - Administrator, Civil Service (Medical): No    Lack of Transportation (Non-Medical): No  Physical Activity: Not on file  Stress: Not on file  Social Connections: Not on file  Intimate Partner Violence: Not At Risk (04/02/2023)   Humiliation, Afraid, Rape, and Kick questionnaire    Fear of Current or Ex-Partner: No    Emotionally Abused: No    Physically Abused: No    Sexually Abused: No    Outpatient Medications Prior to Visit  Medication Sig Dispense Refill   albuterol  (VENTOLIN  HFA) 108 (90 Base) MCG/ACT inhaler Inhale 2 puffs into the lungs every 6 (six) hours as needed for wheezing or shortness of breath. 18 g 3   ALPRAZolam  (XANAX ) 0.25 MG tablet Take 1 tablet (0.25 mg total) by mouth 2 (two) times daily as needed for anxiety. 60 tablet 2   atorvastatin  (LIPITOR) 40 MG tablet TAKE 1 TABLET BY MOUTH EVERY NIGHT AT BEDTIME 90 tablet 1   BREZTRI  AEROSPHERE 160-9-4.8 MCG/ACT AERO INHALE 2 PUFFS INTO THE LUNGS TWICE DAILY 10.7 g 11   bumetanide  (BUMEX ) 1 MG tablet TAKE 2 TABLET BY MOUTH TWICE DAILY, ADDITIONAL 2 TABLETS IN THE EVENING AS NEEDED 360 tablet 3   Calcium  Carbonate-Vitamin D  (CALCIUM  600+D PO) Take 1 tablet by mouth daily.     carvedilol  (COREG ) 25 MG tablet Take 25 mg by mouth 2 (two) times daily with a meal.     Cholecalciferol  (VITAMIN D3) 10 MCG (400 UNIT) CAPS Take by mouth daily.     Coenzyme Q10 (COQ10) 200 MG CAPS Take 200 mg by mouth daily.     diclofenac  Sodium (VOLTAREN ) 1 % GEL Apply 2 g  topically 4 (four) times daily.     ELIQUIS  5 MG TABS tablet TAKE 1 TABLET BY MOUTH TWICE DAILY 180 tablet 2   empagliflozin  (JARDIANCE ) 10 MG TABS tablet Take 1 tablet (10 mg total) by mouth daily. 30 tablet 5   gabapentin  (NEURONTIN ) 100 MG capsule Take 1 tab po at bedtime 30 capsule 1   levothyroxine  (SYNTHROID ) 25 MCG tablet TAKE 1 TABLET BY MOUTH DAILY BEFORE BREAKFAST 90 tablet 1   lisinopril  (ZESTRIL ) 40 MG tablet TAKE 1 TABLET(40 MG) BY MOUTH DAILY 90 tablet 1   loratadine  (CLARITIN ) 10 MG tablet Take 10 mg by mouth daily.     [START ON 04/13/2024] oxyCODONE -acetaminophen  (PERCOCET) 10-325 MG tablet Take 1 tablet by mouth 2 (two) times daily as needed for pain. 60 tablet 0   [START ON 03/16/2024] oxyCODONE -acetaminophen  (PERCOCET) 10-325 MG tablet  Take 1 tablet by mouth 2 (two) times daily as needed for pain. 60 tablet 0   oxyCODONE -acetaminophen  (PERCOCET) 10-325 MG tablet Take 1 tablet by mouth 2 (two) times daily as needed for pain. 60 tablet 0   pantoprazole  (PROTONIX ) 40 MG tablet TAKE 1 TABLET BY MOUTH EVERY DAY 90 tablet 1   pneumococcal 20-valent conjugate vaccine (PREVNAR 20) 0.5 ML injection Inject 0.5 mLs into the muscle once as needed for up to 1 dose for immunization. 0.5 mL 0   potassium chloride  (KLOR-CON ) 10 MEQ tablet TAKE 4 TABLETS BY MOUTH TWICE DAILY 720 tablet 1   sertraline  (ZOLOFT ) 25 MG tablet Take 1 tablet by mouth daily for 1 week then increase to 2 tablets by mouth daily. 30 tablet 3   sodium chloride  (OCEAN) 0.65 % SOLN nasal spray Place 1 spray into both nostrils as needed for congestion.     traZODone  (DESYREL ) 50 MG tablet TAKE 1 TABLET BY MOUTH EVERY DAY AT BEDTIME 90 tablet 1   triamcinolone  cream (KENALOG ) 0.1 % Apply 1 Application topically 2 (two) times daily as needed (burning dry skin). 45 g 0   TURMERIC PO Take by mouth daily at 6 (six) AM.     No facility-administered medications prior to visit.      ROS:  Review of Systems BREAST: No  symptoms   OBJECTIVE:   Vitals:  There were no vitals taken for this visit.  Physical Exam  Results: No results found. However, due to the size of the patient record, not all encounters were searched. Please check Results Review for a complete set of results.   Assessment/Plan: No diagnosis found.    No orders of the defined types were placed in this encounter.     No follow-ups on file.  Srihari Shellhammer B. Izetta Sakamoto, PA-C 03/05/2024 6:04 PM

## 2024-03-06 ENCOUNTER — Other Ambulatory Visit: Payer: Self-pay | Admitting: Nurse Practitioner

## 2024-03-06 ENCOUNTER — Ambulatory Visit: Admitting: Obstetrics and Gynecology

## 2024-03-06 ENCOUNTER — Telehealth: Payer: Self-pay

## 2024-03-06 ENCOUNTER — Encounter: Payer: Self-pay | Admitting: Obstetrics and Gynecology

## 2024-03-06 ENCOUNTER — Telehealth: Payer: Self-pay | Admitting: Nurse Practitioner

## 2024-03-06 VITALS — BP 108/66 | HR 73 | Ht <= 58 in | Wt 192.0 lb

## 2024-03-06 DIAGNOSIS — A630 Anogenital (venereal) warts: Secondary | ICD-10-CM | POA: Diagnosis not present

## 2024-03-06 DIAGNOSIS — R6889 Other general symptoms and signs: Secondary | ICD-10-CM | POA: Diagnosis not present

## 2024-03-06 DIAGNOSIS — E782 Mixed hyperlipidemia: Secondary | ICD-10-CM

## 2024-03-06 MED ORDER — IMIQUIMOD 5 % EX CREA: TOPICAL_CREAM | CUTANEOUS | 1 refills | Status: DC

## 2024-03-06 NOTE — Patient Instructions (Signed)
 I value your feedback and you entrusting [place] with your care. If you get a [place] patient survey, I would appreciate you taking the time to let us know about your experience today. Thank you! ? ? ?

## 2024-03-06 NOTE — Telephone Encounter (Signed)
 I spoke to [facility] and gave her the information that Gracie was calling her about.

## 2024-03-06 NOTE — Telephone Encounter (Signed)
 Patient called to check status of w/c that was ordered 01/01/24. Stevphen will s/w AA, then call patient back-Toni

## 2024-03-06 NOTE — Telephone Encounter (Signed)
 Called pt to let her know insurance doesn't cover Rx. She can use [facility]. [facility] with [facility] is $19.01, she can request Rx transfer. No answer, LVMTRC.

## 2024-03-07 NOTE — Telephone Encounter (Signed)
 TRIAGE VOICEMAIL: Patient states she has been unable to get thru to the pharmacy to request the rx transfer. Inquiring if we can assist her with this.

## 2024-03-17 ENCOUNTER — Other Ambulatory Visit: Payer: Self-pay | Admitting: Nurse Practitioner

## 2024-03-17 DIAGNOSIS — J449 Chronic obstructive pulmonary disease, unspecified: Secondary | ICD-10-CM

## 2024-03-19 ENCOUNTER — Encounter: Payer: Self-pay | Admitting: Nurse Practitioner

## 2024-03-20 ENCOUNTER — Telehealth: Payer: Self-pay

## 2024-03-22 DIAGNOSIS — I480 Paroxysmal atrial fibrillation: Secondary | ICD-10-CM | POA: Diagnosis not present

## 2024-03-25 ENCOUNTER — Other Ambulatory Visit: Payer: Self-pay | Admitting: Nurse Practitioner

## 2024-03-25 ENCOUNTER — Other Ambulatory Visit: Payer: Self-pay | Admitting: Family

## 2024-03-25 DIAGNOSIS — I480 Paroxysmal atrial fibrillation: Secondary | ICD-10-CM

## 2024-03-27 ENCOUNTER — Other Ambulatory Visit: Payer: Self-pay

## 2024-03-27 DIAGNOSIS — Z0000 Encounter for general adult medical examination without abnormal findings: Secondary | ICD-10-CM

## 2024-03-27 MED ORDER — TRAZODONE HCL 50 MG PO TABS: 50.0000 mg | ORAL_TABLET | Freq: Every day | ORAL | 1 refills | Status: DC

## 2024-03-28 ENCOUNTER — Ambulatory Visit: Admitting: Internal Medicine

## 2024-04-01 DIAGNOSIS — G4733 Obstructive sleep apnea (adult) (pediatric): Secondary | ICD-10-CM | POA: Diagnosis not present

## 2024-04-04 ENCOUNTER — Ambulatory Visit (INDEPENDENT_AMBULATORY_CARE_PROVIDER_SITE_OTHER): Admitting: Internal Medicine

## 2024-04-04 ENCOUNTER — Telehealth: Payer: Self-pay

## 2024-04-04 ENCOUNTER — Encounter: Payer: Self-pay | Admitting: Internal Medicine

## 2024-04-04 VITALS — BP 97/62 | HR 73 | Temp 97.8°F | Resp 16 | Ht <= 58 in | Wt 192.0 lb

## 2024-04-04 DIAGNOSIS — J9611 Chronic respiratory failure with hypoxia: Secondary | ICD-10-CM

## 2024-04-04 DIAGNOSIS — R531 Weakness: Secondary | ICD-10-CM

## 2024-04-04 DIAGNOSIS — G894 Chronic pain syndrome: Secondary | ICD-10-CM

## 2024-04-04 DIAGNOSIS — R6883 Chills (without fever): Secondary | ICD-10-CM | POA: Diagnosis not present

## 2024-04-04 DIAGNOSIS — Z789 Other specified health status: Secondary | ICD-10-CM | POA: Diagnosis not present

## 2024-04-04 NOTE — Progress Notes (Signed)
 [facility] [address] [place], [place] [address]  Internal MEDICINE  Office Visit Note  Patient Name: [name]  [id number]  [id number]  Date of Service: 04/04/2024  Chief Complaint  Patient presents with   Follow-up   Hypertension   Hyperlipidemia   Hot Flashes    HPI Patient is here with a few concerns.  Patient is complaining of having intolerance to temperature. She is complaining of being hot/cold at different times of the day. She does not complain about sweating.  Patient is complaining of bilateral shoulder pain, L > R. She has seen pain management in the past and has had I/A injections, but after a while they stopped helping. Patient is on chronic narcotics for pain control. Patient also has difficulty walking due to hip pain. Patient also inquiring about wheelchair. Patient has chronic respiratory failure with hypoxia on chronic oxygen  therapy.   She is followed by cardiology for problematic paroxysmal atrial fibrillation, status post 5 cardioversions more recently 01/15/2020. The patient underwent successful AV node ablation with dual-chamber pacemaker implantation on 02/07/2020. The patient has history of aortic stenosis, status post AVR and aortic root repair.    Narrative & Impression  CLINICAL DATA:  Non-small-cell lung cancer. Restaging. * Tracking Code: BO *   EXAM: CT CHEST WITH CONTRAST   TECHNIQUE: Multidetector CT imaging of the chest was performed during intravenous contrast administration.   RADIATION DOSE REDUCTION: This exam was performed according to the departmental dose-optimization program which includes automated exposure control, adjustment of the mA and/or kV according to patient size and/or use of iterative reconstruction technique.   CONTRAST:  75mL OMNIPAQUE  IOHEXOL  300 MG/ML  SOLN   COMPARISON:  07/09/2023   FINDINGS: Cardiovascular: The heart is enlarged. Status post TAVR. Left-sided permanent pacemaker noted.  Enlargement of the pulmonary outflow tract/main pulmonary arteries suggests pulmonary arterial hypertension.   Mediastinum/Nodes: Stable tiny bilateral thyroid  nodules. 7 mm short axis right paratracheal node on 38/2 is unchanged. Stable 9 mm short axis prevascular node on 46/2. Upper normal lymph nodes in the right hilum are stable. No left hilar lymphadenopathy. The esophagus has normal imaging features. There is no axillary lymphadenopathy.   Lungs/Pleura: Centrilobular and paraseptal emphysema evident. Right apical nodule measured previously at 11 x 10 mm is 8 x 7 mm today on image 29/4 and qualitatively appears similar in the interval as does the surrounding interstitial thickening. Right base chronic atelectasis or scarring is similar to prior. Chronic atelectasis or scarring in the posterior left base is similar no pleural effusion.   Upper Abdomen: Calcified gallstones evident although gallbladder incompletely visualized. No adrenal nodule or mass.   Musculoskeletal: No worrisome lytic or sclerotic osseous abnormality.   IMPRESSION: 1. Right apical nodule measured previously at 11 x 10 mm is 8 x 7 mm today. 2. Stable borderline to mildly enlarged mediastinal and right hilar lymph nodes. Continued attention on follow-up recommended. 3. Cholelithiasis. 4. Enlargement of the pulmonary outflow tract/main pulmonary arteries suggests pulmonary arterial hypertension. 5. Emphysema (ICD10-J43.9) and Aortic Atherosclerosis (ICD10-170.0)    Current Medication: Outpatient Encounter Medications as of 04/04/2024  Medication Sig   albuterol  (VENTOLIN  HFA) 108 (90 Base) MCG/ACT inhaler Inhale 2 puffs into the lungs every 6 (six) hours as needed for wheezing or shortness of breath.   ALPRAZolam  (XANAX ) 0.25 MG tablet Take 1 tablet (0.25 mg total) by mouth 2 (two) times daily as needed for anxiety.   atorvastatin  (LIPITOR) 40 MG tablet TAKE 1 TABLET BY MOUTH  EVERY NIGHT AT BEDTIME    BREZTRI  AEROSPHERE 160-9-4.8 MCG/ACT AERO inhaler INHALE 2 PUFFS INTO THE LUNGS TWICE DAILY   bumetanide  (BUMEX ) 1 MG tablet TAKE 2 TABLET BY MOUTH TWICE DAILY, ADDITIONAL 2 TABLETS IN THE EVENING AS NEEDED   Calcium  Carbonate-Vitamin D  (CALCIUM  600+D PO) Take 1 tablet by mouth daily.   carvedilol  (COREG ) 25 MG tablet Take 25 mg by mouth 2 (two) times daily with a meal.   Cholecalciferol  (VITAMIN D3) 10 MCG (400 UNIT) CAPS Take by mouth daily.   Coenzyme Q10 (COQ10) 200 MG CAPS Take 200 mg by mouth daily.   diclofenac  Sodium (VOLTAREN ) 1 % GEL Apply 2 g topically 4 (four) times daily.   ELIQUIS  5 MG TABS tablet TAKE 1 TABLET BY MOUTH TWICE DAILY   empagliflozin  (JARDIANCE ) 10 MG TABS tablet Take 1 tablet (10 mg total) by mouth daily.   gabapentin  (NEURONTIN ) 100 MG capsule Take 1 tab po at bedtime   imiquimod  (ALDARA ) 5 % cream Apply pea size amount to genital lesion M, W, F at night, wash in AM with soap and water   levothyroxine  (SYNTHROID ) 25 MCG tablet TAKE 1 TABLET BY MOUTH DAILY BEFORE BREAKFAST   lisinopril  (ZESTRIL ) 40 MG tablet TAKE 1 TABLET(40 MG) BY MOUTH DAILY   loratadine  (CLARITIN ) 10 MG tablet Take 10 mg by mouth daily.   [START ON 04/13/2024] oxyCODONE -acetaminophen  (PERCOCET) 10-325 MG tablet Take 1 tablet by mouth 2 (two) times daily as needed for pain.   oxyCODONE -acetaminophen  (PERCOCET) 10-325 MG tablet Take 1 tablet by mouth 2 (two) times daily as needed for pain.   oxyCODONE -acetaminophen  (PERCOCET) 10-325 MG tablet Take 1 tablet by mouth 2 (two) times daily as needed for pain.   pantoprazole  (PROTONIX ) 40 MG tablet TAKE 1 TABLET BY MOUTH EVERY DAY   pneumococcal 20-valent conjugate vaccine (PREVNAR 20) 0.5 ML injection Inject 0.5 mLs into the muscle once as needed for up to 1 dose for immunization.   potassium chloride  (KLOR-CON ) 10 MEQ tablet TAKE 4 TABLETS BY MOUTH TWICE DAILY   sertraline  (ZOLOFT ) 25 MG tablet Take 1 tablet by mouth daily for 1 week then increase to 2  tablets by mouth daily.   sodium chloride  (OCEAN) 0.65 % SOLN nasal spray Place 1 spray into both nostrils as needed for congestion.   traZODone  (DESYREL ) 50 MG tablet Take 1 tablet (50 mg total) by mouth at bedtime.   triamcinolone  cream (KENALOG ) 0.1 % Apply 1 Application topically 2 (two) times daily as needed (burning dry skin).   TURMERIC PO Take by mouth daily at 6 (six) AM.   No facility-administered encounter medications on file as of 04/04/2024.    Surgical History: Past Surgical History:  Procedure Laterality Date   ABDOMINAL AORTIC ANEURYSM REPAIR  2008   ABDOMINAL HYSTERECTOMY     AORTIC VALVE REPLACEMENT  2008   CARDIAC CATHETERIZATION     [facility]   CARDIOVERSION N/A 08/15/2018   Procedure: CARDIOVERSION;  Surgeon: Darron Deatrice LABOR, MD;  Location: [facility] ORS;  Service: Cardiovascular;  Laterality: N/A;   CARDIOVERSION N/A 11/14/2018   Procedure: CARDIOVERSION (CATH LAB);  Surgeon: Darron Deatrice LABOR, MD;  Location: [facility] ORS;  Service: Cardiovascular;  Laterality: N/A;   CARDIOVERSION N/A 06/05/2019   Procedure: CARDIOVERSION;  Surgeon: Darron Deatrice LABOR, MD;  Location: [facility] ORS;  Service: Cardiovascular;  Laterality: N/A;   CARDIOVERSION N/A 08/14/2019   Procedure: CARDIOVERSION;  Surgeon: Darron Deatrice LABOR, MD;  Location: [facility] ORS;  Service: Cardiovascular;  Laterality: N/A;  CARDIOVERSION N/A 11/09/2019   Procedure: CARDIOVERSION;  Surgeon: Ammon Blunt, MD;  Location: [facility] ORS;  Service: Cardiovascular;  Laterality: N/A;   CARDIOVERSION N/A 01/17/2020   Procedure: CARDIOVERSION;  Surgeon: Ammon Blunt, MD;  Location: [facility] ORS;  Service: Cardiovascular;  Laterality: N/A;   CARPAL TUNNEL RELEASE     ELECTROPHYSIOLOGIC STUDY N/A 08/17/2016   Procedure: Cardioversion;  Surgeon: Deatrice DELENA Cage, MD;  Location: [facility] ORS;  Service: Cardiovascular;  Laterality: N/A;   TUMOR EXCISION Left    x3 (arm)    Medical History: Past Medical History:  Diagnosis Date   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 s/p bioprosthetic valve replacement 2008 at [facility];  b. 01/2015 Echo: EF 60-65%, no rwma, Gr1 DD, mildly dil LA, nl RV fxn.   Aspiration pneumonia ([name])    Atrial fibrillation with RVR ([name]) 01/11/2015   Atrial flutter ([name])    a. 08/2016 s/p DCCV.  Remains on flecainide  50 mg bid.   Basal skull fracture ([name]) 20 yrs ago   CHF (congestive heart failure) ([name])    Chronic respiratory failure ([name])    COPD (chronic obstructive pulmonary disease) ([name])    a. 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 2008 prior to Aortic aneurysm repair-->nl cors.   History of stress test    a. 10/2015 MV: no ischemia/infarct.   HLD (hyperlipidemia)    HTN (hypertension)    Hypothyroidism    Obesity    PAF (paroxysmal atrial fibrillation) ([name])    a. on Eliquis ; b. CHADS2VASc = 3 (HTN, age x 1, female).   Paroxysmal atrial fibrillation ([name]) 01/24/2015   Right upper quadrant abdominal tenderness without rebound tenderness 03/02/2018   S/P ascending aortic aneurysm repair 2008    Family History: Family History  Problem Relation Age of Onset   Stroke Father    Stroke Paternal Grandmother    Breast cancer Neg Hx     Social History   Socioeconomic History   Marital status: Divorced    Spouse name: Not on file   Number of children: Not on file   Years of education: Not on file   Highest education level: Not on file  Occupational History   Not on file  Tobacco Use   Smoking status: Former    Types: Cigarettes   Smokeless tobacco: Never  Vaping Use   Vaping status: Never Used  Substance and Sexual Activity   Alcohol use: No   Drug use: No   Sexual activity: Not Currently    Birth control/protection: Surgical  Other Topics Concern   Not on file  Social History Narrative   Not on file   Social Drivers of Health   Financial Resource  Strain: Low Risk  (11/03/2022)   Received from [facility]   Overall Financial Resource Strain (CARDIA)    Difficulty of Paying Living Expenses: Not hard at all  Recent Concern: Financial Resource Strain - Medium Risk (11/02/2022)   Received from [facility]   Overall Financial Resource Strain (CARDIA)    Difficulty of Paying Living Expenses: Somewhat hard  Food Insecurity: Food Insecurity Present (04/02/2023)   Hunger Vital Sign    Worried About Running Out of Food in the Last Year: Sometimes true    Ran Out of Food  in the Last Year: Sometimes true  Transportation Needs: No Transportation Needs (02/02/2023)   PRAPARE - Administrator, Civil Service (Medical): No    Lack of Transportation (Non-Medical): No  Physical Activity: Not on file  Stress: Not on file  Social Connections: Not on file  Intimate Partner Violence: Not At Risk (04/02/2023)   Humiliation, Afraid, Rape, and Kick questionnaire    Fear of Current or Ex-Partner: No    Emotionally Abused: No    Physically Abused: No    Sexually Abused: No      Review of Systems  Constitutional:  Negative for chills, fatigue and unexpected weight change.  HENT:  Negative for congestion, postnasal drip, rhinorrhea, sneezing and sore throat.   Eyes:  Negative for redness.  Respiratory:  Negative for cough, chest tightness and shortness of breath.   Cardiovascular:  Negative for chest pain and palpitations.  Gastrointestinal:  Negative for abdominal pain, constipation, diarrhea, nausea and vomiting.  Endocrine: Positive for cold intolerance.  Genitourinary:  Negative for dysuria and frequency.  Musculoskeletal:  Positive for arthralgias, back pain and joint swelling. Negative for neck pain.  Skin:  Negative for rash.  Neurological: Negative.  Negative for tremors and numbness.  Hematological:  Negative for adenopathy. Does not bruise/bleed easily.  Psychiatric/Behavioral:  Negative for  behavioral problems (Depression), sleep disturbance and suicidal ideas. The patient is nervous/anxious.     Vital Signs: BP 97/62   Pulse 73   Temp 97.8 F (36.6 C)   Resp 16   Ht 4' 10 (1.473 m)   Wt 192 lb (87.1 kg)   SpO2 92%   BMI 40.13 kg/m    Physical Exam Constitutional:      Appearance: Normal appearance.  HENT:     Head: Normocephalic and atraumatic.     Nose: Nose normal.     Mouth/Throat:     Mouth: Mucous membranes are moist.     Pharynx: No posterior oropharyngeal erythema.  Eyes:     Extraocular Movements: Extraocular movements intact.     Pupils: Pupils are equal, round, and reactive to light.  Cardiovascular:     Pulses: Normal pulses.     Heart sounds: Normal heart sounds.  Pulmonary:     Effort: Pulmonary effort is normal.     Breath sounds: Normal breath sounds.  Musculoskeletal:     Cervical back: Tenderness present.     Comments: Restricted range of motion bilateral shoulder with pain.  Neurological:     General: No focal deficit present.     Mental Status: She is alert.  Psychiatric:        Mood and Affect: Mood normal.        Behavior: Behavior normal.        Assessment/Plan: 1. Chills (without fever) (Primary) This is unclear etiology, might be related to some of her chemotherapeutic agents. Reassurance was provided, however we will still check TSH and Ferritin levels. - Iron, TIBC and Ferritin Panel - B12 and Folate Panel - CBC with Differential/Platelet; Future - TSH; Future - T4, free; Future - Comprehensive metabolic panel with GFR  2. Weakness generalized Patient has excessive generalized weakness, this is multifactorial. Patient has chronic respiratory failure, chronic atrial fibrillation and  Patient has been waiting on her motorized wheelchair to help her with her ADLs and to elevate or boost mood to prevent depression.  3. Poor tolerance for ambulation As noted in #2. Patient also has chronic arthritis of hip and  back.  4. Chronic respiratory failure with hypoxia ([name]) Patient is on oxygen  and is tolerating well.   5. Chronic pain syndrome Patient is already on Oxycodone  10mg , BID, which is a reasonable dose. I instructed her to divide the 10mg , 5mg  during the day and 5mg  during the evening.   . General Counseling: Adiyah verbalizes understanding of the findings of todays visit and agrees with plan of treatment. I have discussed any further diagnostic evaluation that may be needed or ordered today. We also reviewed her medications today. she has been encouraged to call the office with any questions or concerns that should arise related to todays visit.    Orders Placed This Encounter  Procedures   Iron, TIBC and Ferritin Panel   B12 and Folate Panel   CBC with Differential/Platelet   TSH   T4, free   Comprehensive metabolic panel with GFR    No orders of the defined types were placed in this encounter.   Total time spent:30 Minutes Time spent includes review of chart, medications, test results, and follow up plan with the patient.   [place] Controlled Substance Database was reviewed by me.   Dr Vincente Asbridge M [facility]

## 2024-04-04 NOTE — Telephone Encounter (Signed)
 Faxed [employer] for electric wheelchair

## 2024-04-05 ENCOUNTER — Other Ambulatory Visit: Payer: Self-pay | Admitting: Nurse Practitioner

## 2024-04-05 DIAGNOSIS — R61 Generalized hyperhidrosis: Secondary | ICD-10-CM

## 2024-04-05 LAB — COMPREHENSIVE METABOLIC PANEL WITH GFR
ALT: 11 IU/L (ref 0–32)
AST: 12 IU/L (ref 0–40)
Albumin: 4.2 g/dL (ref 3.8–4.8)
Alkaline Phosphatase: 96 IU/L (ref 44–121)
BUN/Creatinine Ratio: 20 (ref 12–28)
BUN: 18 mg/dL (ref 8–27)
Bilirubin Total: 0.3 mg/dL (ref 0.0–1.2)
CO2: 23 mmol/L (ref 20–29)
Calcium: 9.1 mg/dL (ref 8.7–10.3)
Chloride: 97 mmol/L (ref 96–106)
Creatinine, Ser: 0.9 mg/dL (ref 0.57–1.00)
Globulin, Total: 2.5 g/dL (ref 1.5–4.5)
Glucose: 98 mg/dL (ref 70–99)
Potassium: 4.1 mmol/L (ref 3.5–5.2)
Sodium: 137 mmol/L (ref 134–144)
Total Protein: 6.7 g/dL (ref 6.0–8.5)
eGFR: 66 mL/min/1.73 (ref >59)

## 2024-04-05 LAB — IRON,TIBC AND FERRITIN PANEL
Ferritin: 369 ng/mL — ABNORMAL HIGH (ref 15–150)
Iron Saturation: 17 % (ref 15–55)
Iron: 52 ug/dL (ref 27–139)
Total Iron Binding Capacity: 302 ug/dL (ref 250–450)
UIBC: 250 ug/dL (ref 118–369)

## 2024-04-05 LAB — B12 AND FOLATE PANEL
Folate: 17.1 ng/mL (ref >3.0)
Vitamin B-12: 471 pg/mL (ref 232–1245)

## 2024-04-12 ENCOUNTER — Telehealth: Payer: Self-pay | Admitting: Family

## 2024-04-12 NOTE — Telephone Encounter (Signed)
 Called to confirm/remind patient of their appointment [facility] on 04/13/24.   Appointment:   [] Confirmed  [x] Left mess   [] No answer/No voice mail  [] VM Full/unable to leave message  [] Phone not in service  Patient reminded to bring all medications and/or complete list.  Confirmed patient has transportation. Gave directions, instructed to utilize valet parking.

## 2024-04-12 NOTE — Progress Notes (Deleted)
 Advanced Heart Failure Clinic Note   Referring Physician: PCP: [name], NP Cardiologist: Ammon Blunt, MD (last seen 04/25)  Chief Complaint:    HPI:  [name] is a 78 y/o female with a history of hyperlipidemia, HTN, thyroid  disease, lung cancer, severe COPD, left ear deafness, atrial fibrillation, aortic stenosis s/p aortic valve replacement with bioprosthetic valve in 2008, s/p TAVR on 11/03/2022 at [facility], ascending aortic aneurysm s/p repair, history of AV node ablation and dual-chamber pacemaker placement in June 2021, stroke, lung cancer, anxiety, chronic anemia, previous tobacco use and chronic heart failure. Status post cardioversions 10/2018, 06/2019, 08/2019, 11/2019, 01/2020.   Has had recurrent atrial fibrillation since 10/24/2019. She was evaluated by Dr. Rosine at [facility], transiently on flecainide  100 mg twice daily with resultant QRS duration increased from 98 Andrea to 150 Andrea, and was therefore decreased to 50 mg twice daily. She was also on diltiazem  for rate control. The patient is being considered for AV nodal ablation with possible pacemaker. Evaluated by anesthesia at [facility] to assess risk during catheter ablation and possible pacemaker implantation. 2D echocardiogram 04/08/2018 revealed LVEF greater than 55%, with stable appearing bioprosthetic valve without significant aortic stenosis or aortic insufficiency. The patient underwent successful cardioversion 11/10/2019. However, she developed recurrent atrial fibrillation/atrial flutter with elevated heart rate. She called Dr. Rosine office and was instructed to increase her flecainide  to 100 mg 3 times daily. ECG 01/09/2020 revealed atrial flutter at a rate of 105 bpm with QTC of 499 Andrea. The patient underwent repeat successful cardioversion 01/15/2020. The patient underwent successful AV node ablation with dual-chamber pacemaker implantation on 02/07/2020 at [facility] by Dr. Rosine.   Admitted 05/11/2020 with respiratory failure  secondary to COPD exacerbation and chronic diastolic congestive heart failure.   Admitted 09/09/2021 with respiratory failure, which was likely multifactorial, with underlying severe COPD on 4 L O2 by nasal cannula, element of acute on chronic diastolic congestive heart failure, BNP on admission was only 170. Echo 11/11/2021 revealed LV EF of 60 to 65%.   Admitted 08/04/22 due to worsening shortness of breath with exertion from her baseline associated with increased abdominal girth and orthopnea for about 4 days. Initially given IV lasix  with transition to oral diuretics. Cardiology consult obtained.   Admitted 11/02/22 for TAVR. Echo 11/04/22: EF >55%, moderate LVH     Admitted 02/01/23 due to HF exacerbation. Given IV bumex .   Was in the ED 03/06/23 due to neck pain.   Was in the ED 01/02/24 with right arm pain.   Previous cardiac studies:   Echo 02/07/20: EF of >55% along with severe LAE.  Echo 05/16/21: EF of 45% along with mild LVH. Echo 07/24/22: EF of >55% along with mild LVH. Cath 10/19/22: Reduced cardiac index at rest with PCWP 13 mmHg.  Moderate increase in PA pressure; PVR 3.9 Wu.  RA pressure 8 mm Hg. No obstructive CAD; left dominant system.   She presents today for a HF follow-up visit with a chief complaint of    ROS: All systems negative except what is listed in HPI, PMH and Problem List    Past Medical History:  Diagnosis Date   Aortic 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 s/p bioprosthetic valve replacement 2008 at [facility];  b. 01/2015 Echo: EF 60-65%, no rwma, Gr1 DD, mildly dil LA, nl RV fxn.   Aspiration pneumonia ([name])    Atrial fibrillation with RVR ([name]) 01/11/2015   Atrial flutter ([name])    a. 08/2016  s/p DCCV.  Remains on flecainide  50 mg bid.   Basal skull fracture ([name]) 20 yrs ago   CHF (congestive heart failure) ([name])    Chronic respiratory failure ([name])    COPD (chronic obstructive pulmonary disease) ([name])    a. 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 2008 prior to Aortic aneurysm repair-->nl cors.   History of stress test    a. 10/2015 MV: no ischemia/infarct.   HLD (hyperlipidemia)    HTN (hypertension)    Hypothyroidism    Obesity    PAF (paroxysmal atrial fibrillation) ([name])    a. on Eliquis ; b. CHADS2VASc = 3 (HTN, age x 1, female).   Paroxysmal atrial fibrillation ([name]) 01/24/2015   Right upper quadrant abdominal tenderness without rebound tenderness 03/02/2018   S/P ascending aortic aneurysm repair 2008    Current Outpatient Medications  Medication Sig Dispense Refill   albuterol  (VENTOLIN  HFA) 108 (90 Base) MCG/ACT inhaler Inhale 2 puffs into the lungs every 6 (six) hours as needed for wheezing or shortness of breath. 18 g 3   ALPRAZolam  (XANAX ) 0.25 MG tablet Take 1 tablet (0.25 mg total) by mouth 2 (two) times daily as needed for anxiety. 60 tablet 2   atorvastatin  (LIPITOR) 40 MG tablet TAKE 1 TABLET BY MOUTH EVERY NIGHT AT BEDTIME 90 tablet 1   BREZTRI  AEROSPHERE 160-9-4.8 MCG/ACT AERO inhaler INHALE 2 PUFFS INTO THE LUNGS TWICE DAILY 10.7 g 11   bumetanide  (BUMEX ) 1 MG tablet TAKE 2 TABLET BY MOUTH TWICE DAILY, ADDITIONAL 2 TABLETS IN THE EVENING AS NEEDED 360 tablet 3   Calcium  Carbonate-Vitamin D  (CALCIUM  600+D PO) Take 1 tablet by mouth daily.     carvedilol  (COREG ) 25 MG tablet Take 25 mg by mouth 2 (two) times daily with a meal.     Cholecalciferol  (VITAMIN D3) 10 MCG (400 UNIT) CAPS Take by mouth daily.     Coenzyme Q10 (COQ10) 200 MG CAPS Take 200 mg by mouth daily.     diclofenac  Sodium (VOLTAREN ) 1 % GEL Apply 2 g topically 4 (four) times daily.     ELIQUIS  5 MG TABS tablet TAKE 1 TABLET BY MOUTH TWICE DAILY 180 tablet 2   empagliflozin  (JARDIANCE ) 10 MG TABS tablet Take 1 tablet (10 mg total) by mouth daily. 30 tablet 5   gabapentin  (NEURONTIN ) 100 MG capsule TAKE 1 CAPSULE BY MOUTH AT BEDTIME 30  capsule 3   imiquimod  (ALDARA ) 5 % cream Apply pea size amount to genital lesion M, W, F at night, wash in AM with soap and water 12 each 1   levothyroxine  (SYNTHROID ) 25 MCG tablet TAKE 1 TABLET BY MOUTH DAILY BEFORE BREAKFAST 90 tablet 1   lisinopril  (ZESTRIL ) 40 MG tablet TAKE 1 TABLET(40 MG) BY MOUTH DAILY 90 tablet 1   loratadine  (CLARITIN ) 10 MG tablet Take 10 mg by mouth daily.     [START ON 04/13/2024] oxyCODONE -acetaminophen  (PERCOCET) 10-325 MG tablet Take 1 tablet by mouth 2 (two) times daily as needed for pain. 60 tablet 0   oxyCODONE -acetaminophen  (PERCOCET) 10-325 MG tablet Take 1 tablet by mouth 2 (two) times daily as needed for pain. 60 tablet 0   oxyCODONE -acetaminophen  (PERCOCET) 10-325 MG tablet Take 1 tablet by mouth 2 (two) times daily as needed for pain. 60 tablet 0   pantoprazole  (PROTONIX ) 40  MG tablet TAKE 1 TABLET BY MOUTH EVERY DAY 90 tablet 1   pneumococcal 20-valent conjugate vaccine (PREVNAR 20) 0.5 ML injection Inject 0.5 mLs into the muscle once as needed for up to 1 dose for immunization. 0.5 mL 0   potassium chloride  (KLOR-CON ) 10 MEQ tablet TAKE 4 TABLETS BY MOUTH TWICE DAILY 720 tablet 1   sertraline  (ZOLOFT ) 25 MG tablet Take 1 tablet by mouth daily for 1 week then increase to 2 tablets by mouth daily. 30 tablet 3   sodium chloride  (OCEAN) 0.65 % SOLN nasal spray Place 1 spray into both nostrils as needed for congestion.     traZODone  (DESYREL ) 50 MG tablet Take 1 tablet (50 mg total) by mouth at bedtime. 90 tablet 1   triamcinolone  cream (KENALOG ) 0.1 % Apply 1 Application topically 2 (two) times daily as needed (burning dry skin). 45 g 0   TURMERIC PO Take by mouth daily at 6 (six) AM.     No current facility-administered medications for this visit.    Allergies  Allergen Reactions   Benadryl [Diphenhydramine Hcl (Sleep)] Palpitations   Cetirizine Palpitations   Lasix  [Furosemide ] Rash   Levaquin [Levofloxacin In D5w] Other (See Comments)    Reaction:   Fatigue and muscle soreness   Meloxicam Rash   Sulfa Antibiotics Rash      Social History   Socioeconomic History   Marital status: Divorced    Spouse name: Not on file   Number of children: Not on file   Years of education: Not on file   Highest education level: Not on file  Occupational History   Not on file  Tobacco Use   Smoking status: Former    Types: Cigarettes   Smokeless tobacco: Never  Vaping Use   Vaping status: Never Used  Substance and Sexual Activity   Alcohol use: No   Drug use: No   Sexual activity: Not Currently    Birth control/protection: Surgical  Other Topics Concern   Not on file  Social History Narrative   Not on file   Social Drivers of Health   Financial Resource Strain: Low Risk  (11/03/2022)   Received from [facility]   Overall Financial Resource Strain (CARDIA)    Difficulty of Paying Living Expenses: Not hard at all  Recent Concern: Financial Resource Strain - Medium Risk (11/02/2022)   Received from [facility]   Overall Financial Resource Strain (CARDIA)    Difficulty of Paying Living Expenses: Somewhat hard  Food Insecurity: Food Insecurity Present (04/02/2023)   Hunger Vital Sign    Worried About Running Out of Food in the Last Year: Sometimes true    Ran Out of Food in the Last Year: Sometimes true  Transportation Needs: No Transportation Needs (02/02/2023)   PRAPARE - Administrator, Civil Service (Medical): No    Lack of Transportation (Non-Medical): No  Physical Activity: Not on file  Stress: Not on file  Social Connections: Not on file  Intimate Partner Violence: Not At Risk (04/02/2023)   Humiliation, Afraid, Rape, and Kick questionnaire    Fear of Current or Ex-Partner: No    Emotionally Abused: No    Physically Abused: No    Sexually Abused: No      Family History  Problem Relation Age of Onset   Stroke Father    Stroke Paternal Grandmother    Breast cancer Neg Hx         PHYSICAL EXAM: General:  Well appearing. No respiratory difficulty HEENT: normal Neck: supple. no JVD. Carotids 2+ bilat; no bruits. No lymphadenopathy or thyromegaly appreciated. Cor: PMI nondisplaced. Regular rate & rhythm. No rubs, gallops or murmurs. Lungs: clear Abdomen: soft, nontender, nondistended. No hepatosplenomegaly. No bruits or masses. Good bowel sounds. Extremities: no cyanosis, clubbing, rash, edema Neuro: alert & oriented x 3, cranial nerves grossly intact. moves all 4 extremities w/o difficulty. Affect pleasant.  ECG:   ASSESSMENT & PLAN:  1: Chronic heart failure with preserved ejection fraction- - suspect due to  - NYHA class III - euvolemic today - weighing daily; reminded to call for an overnight weight gain of >2 pounds or a weekly weight gain of >5 pounds - echo 11/04/22: EF >55%, moderate LVH Echo 07/24/22: EF of >55% along with mild LVH. Echo 11/11/21: EF of 60-65%. Echo 05/16/21: EF of 45% along with mild LVH. Echo 02/07/20: EF of >55% along with severe LAE.  - Cath 10/19/22: Reduced cardiac index at rest with PCWP 13 mmHg.  Moderate increase in PA pressure; PVR 3.9 Wu.  RA pressure 8 mm Hg. No obstructive CAD; left dominant system. - Echo 11/04/22: EF >55%, moderate LVH    - not adding salt and is using pepper for seasoning; reviewed the importance of closely following a low sodium diet  - doesn't cook so eating frozen/ easily prepared foods such as chicken nuggets, pizza etc; does look at food labels and emphasized that she keep her daily sodium to 2000mg  - she is unsure of fluid intake so advised to measure how many ounces her cup holds so that she can keep her daily fluid intake to ~ 60 ounces - continue carvedilol  25mg  BID - continue jardiance  10mg  daily - continue lisinopril  40mg  daily - continue bumex  2mg  BID/ potassium 10meq BID - BNP 02/01/23 was 330.6  2: HTN- - BP  - saw PCP PAIGE Bathe) 07/25 - BMP 03/06/23 reviewed and showed sodium 137,  potassium 3.7, creatinine 0.72 and GFR >60  3: severe COPD- - wearing oxygen  at 5L around the clock at home; when she comes to appointments, she has to put her portable tank on 3L/ this tank is pulse and she doesn't like it as much as her continuous oxygen  at home - saw pulmonology Orvil) 02/25  4: Atrial fibrillation-  - dual chamber pacemaker placed June 2021 - has had multiple cardioversions - apixaban  5mg  BID - saw cardiology (Paraschos) 04/25  5: TAVR- - s/p Wheat and hemiarch procedure with a #9mm CE Magna bovine pericardial valve on 03/30/2007 for bicuspid aortic valve syndrome  - saw cardiothoracic provider Marie) 10/19/22 - TAVR done 11/02/22  6: Neck pain- - going to pain management clinic later today  Return in 1 month, sooner if needed.     Ellouise DELENA Class, FNP 04/12/24

## 2024-04-13 ENCOUNTER — Encounter: Admitting: Family

## 2024-04-13 ENCOUNTER — Other Ambulatory Visit: Payer: Self-pay

## 2024-04-13 ENCOUNTER — Telehealth: Payer: Self-pay

## 2024-04-13 DIAGNOSIS — R6883 Chills (without fever): Secondary | ICD-10-CM

## 2024-04-13 DIAGNOSIS — R531 Weakness: Secondary | ICD-10-CM

## 2024-04-13 DIAGNOSIS — J9611 Chronic respiratory failure with hypoxia: Secondary | ICD-10-CM

## 2024-04-14 NOTE — Telephone Encounter (Signed)
 Pt advised please go back do  labs due to some labs are pending

## 2024-04-17 DIAGNOSIS — R531 Weakness: Secondary | ICD-10-CM | POA: Diagnosis not present

## 2024-04-17 DIAGNOSIS — R6883 Chills (without fever): Secondary | ICD-10-CM | POA: Diagnosis not present

## 2024-04-17 DIAGNOSIS — J9611 Chronic respiratory failure with hypoxia: Secondary | ICD-10-CM | POA: Diagnosis not present

## 2024-04-18 ENCOUNTER — Telehealth: Payer: Self-pay

## 2024-04-18 ENCOUNTER — Other Ambulatory Visit: Payer: Self-pay

## 2024-04-18 LAB — CBC WITH DIFFERENTIAL/PLATELET
Basophils Absolute: 0 x10E3/uL (ref 0.0–0.2)
Basos: 1 %
EOS (ABSOLUTE): 0.1 x10E3/uL (ref 0.0–0.4)
Eos: 2 %
Hematocrit: 35.1 % (ref 34.0–46.6)
Hemoglobin: 10.8 g/dL — ABNORMAL LOW (ref 11.1–15.9)
Immature Grans (Abs): 0 x10E3/uL (ref 0.0–0.1)
Immature Granulocytes: 0 %
Lymphocytes Absolute: 1 x10E3/uL (ref 0.7–3.1)
Lymphs: 18 %
MCH: 27.1 pg (ref 26.6–33.0)
MCHC: 30.8 g/dL — ABNORMAL LOW (ref 31.5–35.7)
MCV: 88 fL (ref 79–97)
Monocytes Absolute: 0.6 x10E3/uL (ref 0.1–0.9)
Monocytes: 12 %
Neutrophils Absolute: 3.6 x10E3/uL (ref 1.4–7.0)
Neutrophils: 66 %
Platelets: 254 x10E3/uL (ref 150–450)
RBC: 3.99 x10E6/uL (ref 3.77–5.28)
RDW: 15.9 % — ABNORMAL HIGH (ref 11.7–15.4)
WBC: 5.3 x10E3/uL (ref 3.4–10.8)

## 2024-04-18 LAB — TSH: TSH: 4.77 u[IU]/mL — ABNORMAL HIGH (ref 0.450–4.500)

## 2024-04-18 LAB — T4, FREE: Free T4: 1.28 ng/dL (ref 0.82–1.77)

## 2024-04-18 NOTE — Telephone Encounter (Signed)
 Spoke with patient regarding thyroid  labs. Per DFK, patient is to now take 50mcg of Synthroid .

## 2024-04-25 ENCOUNTER — Ambulatory Visit (INDEPENDENT_AMBULATORY_CARE_PROVIDER_SITE_OTHER): Payer: Medicare Other | Admitting: Internal Medicine

## 2024-04-25 ENCOUNTER — Encounter: Payer: Self-pay | Admitting: Internal Medicine

## 2024-04-25 VITALS — BP 130/70 | HR 72 | Temp 97.8°F | Resp 16 | Ht <= 58 in | Wt 192.0 lb

## 2024-04-25 DIAGNOSIS — G4733 Obstructive sleep apnea (adult) (pediatric): Secondary | ICD-10-CM | POA: Diagnosis not present

## 2024-04-25 DIAGNOSIS — J4489 Other specified chronic obstructive pulmonary disease: Secondary | ICD-10-CM | POA: Diagnosis not present

## 2024-04-25 DIAGNOSIS — C3411 Malignant neoplasm of upper lobe, right bronchus or lung: Secondary | ICD-10-CM

## 2024-04-25 DIAGNOSIS — J9611 Chronic respiratory failure with hypoxia: Secondary | ICD-10-CM

## 2024-04-25 NOTE — Progress Notes (Signed)
 [facility] [address] [place], [place] [address]  Pulmonary Sleep Medicine   Office Visit Note  Patient Name: [name] DOB: Jan 22, 1946 MRN [id number]  Date of Service: 04/25/2024  Complaints/HPI: She is doing about the same. Patient states she has difficulty losing weight. Patient states she has had her adjustment of the thyroid  meds. Patient has had no significant improvement noted. Patient still with shortness of breath with exertion. She also states she is using her CPAP as prescribed  Office Spirometry Results:     ROS  General: (-) fever, (-) chills, (-) night sweats, (-) weakness Skin: (-) rashes, (-) itching,. Eyes: (-) visual changes, (-) redness, (-) itching. Nose and Sinuses: (-) nasal stuffiness or itchiness, (-) postnasal drip, (-) nosebleeds, (-) sinus trouble. Mouth and Throat: (-) sore throat, (-) hoarseness. Neck: (-) swollen glands, (-) enlarged thyroid , (-) neck pain. Respiratory: - cough, (-) bloody sputum, + shortness of breath, - wheezing. Cardiovascular: - ankle swelling, (-) chest pain. Lymphatic: (-) lymph node enlargement. Neurologic: (-) numbness, (-) tingling. Psychiatric: (-) anxiety, (-) depression   Current Medication: Outpatient Encounter Medications as of 04/25/2024  Medication Sig   albuterol  (VENTOLIN  HFA) 108 (90 Base) MCG/ACT inhaler Inhale 2 puffs into the lungs every 6 (six) hours as needed for wheezing or shortness of breath.   ALPRAZolam  (XANAX ) 0.25 MG tablet Take 1 tablet (0.25 mg total) by mouth 2 (two) times daily as needed for anxiety.   atorvastatin  (LIPITOR) 40 MG tablet TAKE 1 TABLET BY MOUTH EVERY NIGHT AT BEDTIME   BREZTRI  AEROSPHERE 160-9-4.8 MCG/ACT AERO inhaler INHALE 2 PUFFS INTO THE LUNGS TWICE DAILY   bumetanide  (BUMEX ) 1 MG tablet TAKE 2 TABLET BY MOUTH TWICE DAILY, ADDITIONAL 2 TABLETS IN THE EVENING AS NEEDED   Calcium  Carbonate-Vitamin D  (CALCIUM  600+D PO) Take 1 tablet by mouth daily.    carvedilol  (COREG ) 25 MG tablet Take 25 mg by mouth 2 (two) times daily with a meal.   Cholecalciferol  (VITAMIN D3) 10 MCG (400 UNIT) CAPS Take by mouth daily.   Coenzyme Q10 (COQ10) 200 MG CAPS Take 200 mg by mouth daily.   diclofenac  Sodium (VOLTAREN ) 1 % GEL Apply 2 g topically 4 (four) times daily.   ELIQUIS  5 MG TABS tablet TAKE 1 TABLET BY MOUTH TWICE DAILY   empagliflozin  (JARDIANCE ) 10 MG TABS tablet Take 1 tablet (10 mg total) by mouth daily.   gabapentin  (NEURONTIN ) 100 MG capsule TAKE 1 CAPSULE BY MOUTH AT BEDTIME   imiquimod  (ALDARA ) 5 % cream Apply pea size amount to genital lesion M, W, F at night, wash in AM with soap and water   levothyroxine  (SYNTHROID ) 25 MCG tablet TAKE 1 TABLET BY MOUTH DAILY BEFORE BREAKFAST   lisinopril  (ZESTRIL ) 40 MG tablet TAKE 1 TABLET(40 MG) BY MOUTH DAILY   loratadine  (CLARITIN ) 10 MG tablet Take 10 mg by mouth daily.   oxyCODONE -acetaminophen  (PERCOCET) 10-325 MG tablet Take 1 tablet by mouth 2 (two) times daily as needed for pain.   oxyCODONE -acetaminophen  (PERCOCET) 10-325 MG tablet Take 1 tablet by mouth 2 (two) times daily as needed for pain.   oxyCODONE -acetaminophen  (PERCOCET) 10-325 MG tablet Take 1 tablet by mouth 2 (two) times daily as needed for pain.   pantoprazole  (PROTONIX ) 40 MG tablet TAKE 1 TABLET BY MOUTH EVERY DAY   pneumococcal 20-valent conjugate vaccine (PREVNAR 20) 0.5 ML injection Inject 0.5 mLs into the muscle once as needed for up to 1 dose for immunization.   potassium chloride  (  KLOR-CON ) 10 MEQ tablet TAKE 4 TABLETS BY MOUTH TWICE DAILY   sertraline  (ZOLOFT ) 25 MG tablet Take 1 tablet by mouth daily for 1 week then increase to 2 tablets by mouth daily.   sodium chloride  (OCEAN) 0.65 % SOLN nasal spray Place 1 spray into both nostrils as needed for congestion.   traZODone  (DESYREL ) 50 MG tablet Take 1 tablet (50 mg total) by mouth at bedtime.   triamcinolone  cream (KENALOG ) 0.1 % Apply 1 Application topically 2 (two) times  daily as needed (burning dry skin).   TURMERIC PO Take by mouth daily at 6 (six) AM.   No facility-administered encounter medications on file as of 04/25/2024.    Surgical History: Past Surgical History:  Procedure Laterality Date   ABDOMINAL AORTIC ANEURYSM REPAIR  2008   ABDOMINAL HYSTERECTOMY     AORTIC VALVE REPLACEMENT  2008   CARDIAC CATHETERIZATION     [facility]   CARDIOVERSION N/A 08/15/2018   Procedure: CARDIOVERSION;  Surgeon: Darron Deatrice LABOR, MD;  Location: [facility] ORS;  Service: Cardiovascular;  Laterality: N/A;   CARDIOVERSION N/A 11/14/2018   Procedure: CARDIOVERSION (CATH LAB);  Surgeon: Darron Deatrice LABOR, MD;  Location: [facility] ORS;  Service: Cardiovascular;  Laterality: N/A;   CARDIOVERSION N/A 06/05/2019   Procedure: CARDIOVERSION;  Surgeon: Darron Deatrice LABOR, MD;  Location: [facility] ORS;  Service: Cardiovascular;  Laterality: N/A;   CARDIOVERSION N/A 08/14/2019   Procedure: CARDIOVERSION;  Surgeon: Darron Deatrice LABOR, MD;  Location: [facility] ORS;  Service: Cardiovascular;  Laterality: N/A;   CARDIOVERSION N/A 11/09/2019   Procedure: CARDIOVERSION;  Surgeon: Ammon Blunt, MD;  Location: [facility] ORS;  Service: Cardiovascular;  Laterality: N/A;   CARDIOVERSION N/A 01/17/2020   Procedure: CARDIOVERSION;  Surgeon: Ammon Blunt, MD;  Location: [facility] ORS;  Service: Cardiovascular;  Laterality: N/A;   CARPAL TUNNEL RELEASE     ELECTROPHYSIOLOGIC STUDY N/A 08/17/2016   Procedure: Cardioversion;  Surgeon: Deatrice LABOR Darron, MD;  Location: [facility] ORS;  Service: Cardiovascular;  Laterality: N/A;   TUMOR EXCISION Left    x3 (arm)    Medical History: Past Medical History:  Diagnosis Date   Aortic 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 s/p bioprosthetic valve replacement 2008 at [facility];  b. 01/2015 Echo: EF 60-65%, no rwma, Gr1 DD, mildly dil LA, nl RV fxn.   Aspiration pneumonia ([name])    Atrial fibrillation with RVR ([name]) 01/11/2015   Atrial flutter ([name])    a. 08/2016 s/p DCCV.   Remains on flecainide  50 mg bid.   Basal skull fracture ([name]) 20 yrs ago   CHF (congestive heart failure) ([name])    Chronic respiratory failure ([name])    COPD (chronic obstructive pulmonary disease) ([name])    a. 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 2008 prior to Aortic aneurysm repair-->nl cors.   History of stress test    a. 10/2015 MV: no ischemia/infarct.   HLD (hyperlipidemia)    HTN (hypertension)    Hypothyroidism    Obesity    PAF (paroxysmal atrial fibrillation) ([name])    a. on Eliquis ; b. CHADS2VASc = 3 (HTN, age x 1, female).   Paroxysmal atrial fibrillation ([name]) 01/24/2015   Right upper quadrant abdominal tenderness without rebound tenderness 03/02/2018   S/P ascending aortic aneurysm repair 2008    Family History: Family History  Problem Relation Age of Onset   Stroke Father    Stroke Paternal Grandmother    Breast cancer Neg Hx     Social History: Social History   Socioeconomic History   Marital status: Divorced    Spouse name: Not on file   Number of children: Not on file   Years of education: Not on file   Highest education level: Not on file  Occupational History   Not on file  Tobacco Use   Smoking status: Former    Types: Cigarettes   Smokeless tobacco: Never  Vaping Use   Vaping status: Never Used  Substance and Sexual Activity   Alcohol use: No   Drug use: No   Sexual activity: Not Currently    Birth control/protection: Surgical  Other Topics Concern   Not on file  Social History Narrative   Not on file   Social Drivers of Health   Financial Resource Strain: Low Risk  (11/03/2022)   Received from [facility]   Overall Financial Resource Strain (CARDIA)    Difficulty of Paying Living Expenses: Not hard at all  Recent Concern: Financial Resource Strain - Medium Risk (11/02/2022)   Received from [facility]   Overall Financial Resource Strain (CARDIA)    Difficulty of Paying Living Expenses: Somewhat hard  Food Insecurity: Food Insecurity Present (04/02/2023)   Hunger Vital Sign    Worried About Running Out of Food in the Last Year: Sometimes true    Ran Out of Food in the Last Year: Sometimes true  Transportation Needs: No Transportation Needs (02/02/2023)   PRAPARE - Administrator, Civil Service (Medical): No    Lack of Transportation (Non-Medical): No  Physical Activity: Not on file  Stress: Not on file  Social Connections: Not on file  Intimate Partner Violence: Not At Risk (04/02/2023)   Humiliation, Afraid, Rape, and Kick questionnaire    Fear of Current or Ex-Partner: No    Emotionally Abused: No    Physically Abused: No    Sexually Abused: No    Vital Signs: Blood pressure 130/70, pulse 72, temperature 97.8 F (36.6 C), resp. rate 16, height 4' 10 (1.473 m), weight 192 lb (87.1 kg), SpO2 95%.  Examination: General Appearance: The patient is well-developed, well-nourished, and in no distress. Skin: Gross inspection of skin unremarkable. Head: normocephalic, no gross deformities. Eyes: no gross deformities noted. ENT: ears appear grossly normal no exudates. Neck: Supple. No thyromegaly. No LAD. Respiratory: no rhonchi noted. Cardiovascular: Normal S1 and S2 without murmur or rub. Extremities: No cyanosis. pulses are equal. Neurologic: Alert and oriented. No involuntary movements.  LABS: Recent Results (from the past 2160 hours)  Iron, TIBC and Ferritin Panel     Status: Abnormal   Collection Time: 04/04/24 11:13 AM  Result Value Ref Range   Total Iron Binding Capacity 302 250 - 450 ug/dL   UIBC 749 881 - 630 ug/dL   Iron 52 27 - 860 ug/dL   Iron Saturation 17 15 - 55 %   Ferritin 369 (H) 15 - 150 ng/mL  B12 and Folate Panel     Status: None   Collection Time: 04/04/24 11:13 AM  Result Value Ref Range   Vitamin B-12 471 232 -  1,245 pg/mL   Folate 17.1 >3.0 ng/mL    Comment: A serum folate concentration of less than 3.1 ng/mL is considered to represent clinical deficiency.   Comprehensive metabolic panel with GFR  Status: None   Collection Time: 04/04/24 11:13 AM  Result Value Ref Range   Glucose 98 70 - 99 mg/dL   BUN 18 8 - 27 mg/dL   Creatinine, Ser 9.09 0.57 - 1.00 mg/dL   eGFR 66 >40 fO/fpw/8.26   BUN/Creatinine Ratio 20 12 - 28   Sodium 137 134 - 144 mmol/L   Potassium 4.1 3.5 - 5.2 mmol/L   Chloride 97 96 - 106 mmol/L   CO2 23 20 - 29 mmol/L   Calcium  9.1 8.7 - 10.3 mg/dL   Total Protein 6.7 6.0 - 8.5 g/dL   Albumin 4.2 3.8 - 4.8 g/dL   Globulin, Total 2.5 1.5 - 4.5 g/dL   Bilirubin Total 0.3 0.0 - 1.2 mg/dL   Alkaline Phosphatase 96 44 - 121 IU/L   AST 12 0 - 40 IU/L   ALT 11 0 - 32 IU/L  T4, free     Status: None   Collection Time: 04/17/24  1:28 PM  Result Value Ref Range   Free T4 1.28 0.82 - 1.77 ng/dL  CBC with Differential/Platelet     Status: Abnormal   Collection Time: 04/17/24  1:38 PM  Result Value Ref Range   WBC 5.3 3.4 - 10.8 x10E3/uL   RBC 3.99 3.77 - 5.28 x10E6/uL   Hemoglobin 10.8 (L) 11.1 - 15.9 g/dL   Hematocrit 64.8 65.9 - 46.6 %   MCV 88 79 - 97 fL   MCH 27.1 26.6 - 33.0 pg   MCHC 30.8 (L) 31.5 - 35.7 g/dL   RDW 84.0 (H) 88.2 - 84.5 %   Platelets 254 150 - 450 x10E3/uL   Neutrophils 66 Not Estab. %   Lymphs 18 Not Estab. %   Monocytes 12 Not Estab. %   Eos 2 Not Estab. %   Basos 1 Not Estab. %   Neutrophils Absolute 3.6 1.4 - 7.0 x10E3/uL   Lymphocytes Absolute 1.0 0.7 - 3.1 x10E3/uL   Monocytes Absolute 0.6 0.1 - 0.9 x10E3/uL   EOS (ABSOLUTE) 0.1 0.0 - 0.4 x10E3/uL   Basophils Absolute 0.0 0.0 - 0.2 x10E3/uL   Immature Granulocytes 0 Not Estab. %   Immature Grans (Abs) 0.0 0.0 - 0.1 x10E3/uL  TSH     Status: Abnormal   Collection Time: 04/17/24  1:38 PM  Result Value Ref Range   TSH 4.770 (H) 0.450 - 4.500 uIU/mL    Radiology: CT Chest W  Contrast Result Date: 02/03/2024 CLINICAL DATA:  Non-small-cell lung cancer. Restaging. * Tracking Code: BO * EXAM: CT CHEST WITH CONTRAST TECHNIQUE: Multidetector CT imaging of the chest was performed during intravenous contrast administration. RADIATION DOSE REDUCTION: This exam was performed according to the departmental dose-optimization program which includes automated exposure control, adjustment of the mA and/or kV according to patient size and/or use of iterative reconstruction technique. CONTRAST:  75mL OMNIPAQUE  IOHEXOL  300 MG/ML  SOLN COMPARISON:  07/09/2023 FINDINGS: Cardiovascular: The heart is enlarged. Status post TAVR. Left-sided permanent pacemaker noted. Enlargement of the pulmonary outflow tract/main pulmonary arteries suggests pulmonary arterial hypertension. Mediastinum/Nodes: Stable tiny bilateral thyroid  nodules. 7 mm short axis right paratracheal node on 38/2 is unchanged. Stable 9 mm short axis prevascular node on 46/2. Upper normal lymph nodes in the right hilum are stable. No left hilar lymphadenopathy. The esophagus has normal imaging features. There is no axillary lymphadenopathy. Lungs/Pleura: Centrilobular and paraseptal emphysema evident. Right apical nodule measured previously at 11 x 10 mm is 8 x 7 mm today on image 29/4 and  qualitatively appears similar in the interval as does the surrounding interstitial thickening. Right base chronic atelectasis or scarring is similar to prior. Chronic atelectasis or scarring in the posterior left base is similar no pleural effusion. Upper Abdomen: Calcified gallstones evident although gallbladder incompletely visualized. No adrenal nodule or mass. Musculoskeletal: No worrisome lytic or sclerotic osseous abnormality. IMPRESSION: 1. Right apical nodule measured previously at 11 x 10 mm is 8 x 7 mm today. 2. Stable borderline to mildly enlarged mediastinal and right hilar lymph nodes. Continued attention on follow-up recommended. 3.  Cholelithiasis. 4. Enlargement of the pulmonary outflow tract/main pulmonary arteries suggests pulmonary arterial hypertension. 5. Emphysema (ICD10-J43.9) and Aortic Atherosclerosis (ICD10-170.0) Electronically Signed   By: Camellia Candle M.D.   On: 02/03/2024 06:34    No results found.  No results found.  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   CHF (congestive heart failure), NYHA class IV, chronic, diastolic ([name]) 10/22/2022   On home oxygen  therapy 10/22/2022   Hypokalemia 08/04/2022   Chronic upper back pain 04/14/2022   Chronic myofascial pain 04/14/2022   Musculoskeletal disorder involving upper trapezius muscle 04/14/2022   Abnormal CT scan, cervical spine (03/04/2022) 04/01/2022   Trigger point of shoulder region (Bilateral) 04/01/2022   Trigger point with neck pain 04/01/2022   DISH (diffuse idiopathic skeletal hyperostosis) 03/12/2022   Atrial fibrillation, chronic ([name]) 01/26/2022   Morbid obesity ([name]) 01/26/2022   Chronic respiratory failure with hypoxia ([name]) 01/26/2022   Bilateral pneumonia 01/25/2022   Chronic intractable headache 01/07/2022   Chronic diastolic CHF (congestive heart failure) ([name]) 12/02/2021   Hyperlipidemia 12/02/2021   Stroke ([name]) 12/02/2021   Iron deficiency anemia 12/02/2021   Depression 12/02/2021   Acute on chronic respiratory failure with hypoxia ([name]) 11/11/2021   Unable to tolerate prone  position due to orthopnea 10/16/2021   Orthopnea (Positional) 10/16/2021   Class 3 severe obesity due to excess calories with serious comorbidity and body mass index (BMI) of 40.0 to 44.9 in adult 10/16/2021   At high risk for postoperative complications 10/16/2021   Hearing loss 08/12/2021   Long term prescription benzodiazepine use 04/30/2021   Chronic shoulder pain (1ry area of Pain) (Bilateral) (L>R) 04/30/2021   Chronic right shoulder pain 04/30/2021   Cervicalgia 04/30/2021   Chronic neck pain (3ry area of Pain) (Posterior) (Bilateral) (L>R) 04/30/2021   Shoulder blade pain (4th area of Pain) (Left) 04/30/2021   Osteoarthritis of glenohumeral joint (Left) 04/30/2021   Osteoarthritis of AC (acromioclavicular) joint (Left) 04/30/2021   Osteoarthritis of glenohumeral joints (Bilateral) 04/30/2021   Osteoarthritis of acromioclavicular joints (Bilateral) 04/30/2021   Primary osteoarthritis of shoulders (Bilateral) 04/30/2021   DDD (degenerative disc disease), cervical 04/30/2021   Cervical radiculitis (Left) 04/30/2021   Cervical radiculopathy (Left) 04/30/2021   C6 radiculopathy (Left) 04/30/2021   C7 radiculopathy (Left) 04/30/2021   Wheelchair dependence 04/30/2021   Chronic pain syndrome 04/29/2021   Pharmacologic therapy 04/29/2021   Disorder  of skeletal system 04/29/2021   Problems influencing health status 04/29/2021   Chronic anticoagulation (Eliquis ) 03/14/2021   Hx of atrioventricular node ablation 03/14/2021   Symptomatic anemia 01/29/2021   Heart failure due to valvular disease, acute on chronic, diastolic ([name]) 05/11/2020   Chronic obstructive pulmonary disease (COPD) ([name])    Hypothyroidism    Cardiac pacemaker in situ 05/10/2020   Acute non-recurrent frontal sinusitis 04/22/2020   Vertigo 04/22/2020   S/P placement of cardiac pacemaker 02/21/2020   Atrial fibrillation status post cardioversion ([name]) 11/16/2019   Episode of moderate major depression ([name])  10/10/2019   Persistent atrial fibrillation ([name])    Encounter for general adult medical examination with abnormal findings 07/10/2019   Encounter for screening mammogram for malignant neoplasm of breast 07/10/2019   Atopic dermatitis 05/02/2019   Paroxysmal atrial flutter ([name]) 08/11/2018   Encounter for long-term (current) use of medications 07/09/2018   Chronic shoulder pain (Left) 07/09/2018   Conjunctivitis 07/09/2018   Oxygen  dependent 07/09/2018   GAD (generalized anxiety disorder) 07/09/2018   Ovarian failure 07/09/2018   Positive colorectal cancer screening using Cologuard test 04/24/2018   Calculus of gallbladder with acute on chronic cholecystitis 04/08/2018   H/O: CVA (cerebrovascular accident) 04/08/2018   Dysuria 03/02/2018   Right upper quadrant abdominal tenderness without rebound tenderness 03/02/2018   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 exacerbation ([name]) 03/02/2018   Chronic obstructive pulmonary disease ([name]) 09/19/2017   Typical atrial flutter ([name])    Irritable bowel syndrome without diarrhea 01/29/2015   Hypertension 01/24/2015   Paroxysmal atrial fibrillation ([name]) 01/24/2015   History of aortic valve replacement with bioprosthetic valve 01/24/2015   Atrial fibrillation with RVR ([name]) 01/11/2015   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 05/18/2014   Shoulder impingement syndrome (Left) 05/18/2014   Cellulitis and abscess 02/13/2014   MRSA (methicillin resistant Staphylococcus aureus) 02/13/2014   Aneurysm, ascending aorta ([name]) 06/23/2013   Bicuspid aortic valve 06/23/2013    1. 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 exacerbation ([name]) (Primary) She appears to be at baseline. She has no recent admissions. Will continue with current therapy  2. OSA (obstructive sleep apnea) On PAP therapy adequate pressure  3. Chronic respiratory failure with hypoxia ([name]) Continue with her current oxygen  flow rate  4. Malignant neoplasm of upper  lobe of right lung ([name]) Appears to be in remission follow with oncology   General Counseling: I have discussed the findings of the evaluation and examination with Yanice.  I have also discussed any further diagnostic evaluation thatmay be needed or ordered today. Georgina verbalizes understanding of the findings of todays visit. We also reviewed her medications today and discussed drug interactions and side effects including but not limited excessive drowsiness and altered mental states. We also discussed that there is always a risk not just to her but also people around her. she has been encouraged to call the office with any questions or concerns that should arise related to todays visit.  No orders of the defined types were placed in this encounter.    Time spent: 34  I have personally obtained a history, examined the patient, evaluated laboratory and imaging results, formulated the assessment and plan and placed orders.    Elfreda DELENA Bathe, MD [facility] Pulmonary and Critical Care Sleep medicine

## 2024-04-25 NOTE — Patient Instructions (Signed)

## 2024-04-28 NOTE — Telephone Encounter (Signed)
 done

## 2024-05-01 ENCOUNTER — Ambulatory Visit: Admitting: Obstetrics and Gynecology

## 2024-05-02 ENCOUNTER — Ambulatory Visit: Admitting: Internal Medicine

## 2024-05-02 DIAGNOSIS — G4733 Obstructive sleep apnea (adult) (pediatric): Secondary | ICD-10-CM | POA: Diagnosis not present

## 2024-05-02 NOTE — Progress Notes (Deleted)
 Andrea Fish, NP   No chief complaint on file.   HPI:      [name] is a 78 y.o. No obstetric history on file. whose LMP was No LMP recorded. Patient has had a hysterectomy., presents today for f/u   Plan: imiquimod  (ALDARA ) 5 % cream; looks like genital warts but given sx hx, recommended bx to rule out VAIN. Pt declines bx. Doesn't want TCA tx due to pain of burning in the past/declines surg exc due to risks of anesthesia. Is willing to try aldara  crm. Rx eRxd, RTO in 8 wks for f/u/sx re-eval. If no improvement or change in pink areas under wart, will encourage bx again  NP eval vaginal lesions, referred by PCP. Hx of genital warts on labia removed many yrs ago with excision under anesthesia. No relief with acid tx at the time. Not sexually active for many yrs. Noticed area with washing a few months ago, no change in size since first noticed. No vag sx, no hx of abn paps. S/p hyst for PMB in early 50s, no PMB since.  S/p lung cancer with radiation tx 1 1/2 yrs ago. Has mult med probs.  Also with recent episodes of feeling hot in the AM and PM but cold in the afternoons. Started on gabapentin  without sx relief. Didn't do HRT after menopause and didn't have sx like this at that time. Hx of hypothyroidism and several other med issues. Followed by PCP.   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   CHF (congestive heart failure), NYHA class IV,  chronic, diastolic ([name]) 10/22/2022   On home oxygen  therapy 10/22/2022   Hypokalemia 08/04/2022   Chronic upper back pain 04/14/2022   Chronic myofascial pain 04/14/2022   Musculoskeletal disorder involving upper trapezius muscle 04/14/2022   Abnormal CT scan, cervical spine (03/04/2022) 04/01/2022   Trigger point of shoulder region (Bilateral) 04/01/2022   Trigger point with neck pain 04/01/2022   DISH (diffuse idiopathic skeletal hyperostosis) 03/12/2022   Atrial fibrillation, chronic ([name]) 01/26/2022   Morbid obesity ([name]) 01/26/2022   Chronic respiratory failure with hypoxia ([name]) 01/26/2022   Bilateral pneumonia 01/25/2022   Chronic intractable headache 01/07/2022   Chronic diastolic CHF (congestive heart failure) ([name]) 12/02/2021   Hyperlipidemia 12/02/2021   Stroke ([name]) 12/02/2021   Iron deficiency anemia 12/02/2021   Depression 12/02/2021   Acute on chronic respiratory failure with hypoxia ([name]) 11/11/2021   Unable to tolerate prone position due to orthopnea 10/16/2021   Orthopnea (Positional) 10/16/2021   Class 3 severe obesity due to excess calories with serious comorbidity and body mass index (BMI) of 40.0 to 44.9 in adult 10/16/2021   At high risk for postoperative complications 10/16/2021   Hearing loss 08/12/2021   Long term prescription benzodiazepine use 04/30/2021   Chronic shoulder pain (1ry area of Pain) (Bilateral) (L>R) 04/30/2021   Chronic right shoulder pain 04/30/2021   Cervicalgia 04/30/2021   Chronic neck  pain (3ry area of Pain) (Posterior) (Bilateral) (L>R) 04/30/2021   Shoulder blade pain (4th area of Pain) (Left) 04/30/2021   Osteoarthritis of glenohumeral joint (Left) 04/30/2021   Osteoarthritis of AC (acromioclavicular) joint (Left) 04/30/2021   Osteoarthritis of glenohumeral joints (Bilateral) 04/30/2021   Osteoarthritis of acromioclavicular joints (Bilateral) 04/30/2021   Primary osteoarthritis of shoulders (Bilateral) 04/30/2021   DDD  (degenerative disc disease), cervical 04/30/2021   Cervical radiculitis (Left) 04/30/2021   Cervical radiculopathy (Left) 04/30/2021   C6 radiculopathy (Left) 04/30/2021   C7 radiculopathy (Left) 04/30/2021   Wheelchair dependence 04/30/2021   Chronic pain syndrome 04/29/2021   Pharmacologic therapy 04/29/2021   Disorder of skeletal system 04/29/2021   Problems influencing health status 04/29/2021   Chronic anticoagulation (Eliquis ) 03/14/2021   Hx of atrioventricular node ablation 03/14/2021   Symptomatic anemia 01/29/2021   Heart failure due to valvular disease, acute on chronic, diastolic ([name]) 05/11/2020   Chronic obstructive pulmonary disease (COPD) ([name])    Hypothyroidism    Cardiac pacemaker in situ 05/10/2020   Acute non-recurrent frontal sinusitis 04/22/2020   Vertigo 04/22/2020   S/P placement of cardiac pacemaker 02/21/2020   Atrial fibrillation status post cardioversion ([name]) 11/16/2019   Episode of moderate major depression ([name]) 10/10/2019   Persistent atrial fibrillation ([name])    Encounter for general adult medical examination with abnormal findings 07/10/2019   Encounter for screening mammogram for malignant neoplasm of breast 07/10/2019   Atopic dermatitis 05/02/2019   Paroxysmal atrial flutter ([name]) 08/11/2018   Encounter for long-term (current) use of medications 07/09/2018   Chronic shoulder pain (Left) 07/09/2018   Conjunctivitis 07/09/2018   Oxygen  dependent 07/09/2018   GAD (generalized anxiety disorder) 07/09/2018   Ovarian failure 07/09/2018   Positive colorectal cancer screening using Cologuard test 04/24/2018   Calculus of gallbladder with acute on chronic cholecystitis 04/08/2018   H/O: CVA (cerebrovascular accident) 04/08/2018   Dysuria 03/02/2018   Right upper quadrant abdominal tenderness without rebound tenderness 03/02/2018   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 exacerbation ([name]) 03/02/2018   Chronic obstructive pulmonary disease ([name])  09/19/2017   Typical atrial flutter ([name])    Irritable bowel syndrome without diarrhea 01/29/2015   Hypertension 01/24/2015   Paroxysmal atrial fibrillation ([name]) 01/24/2015   History of aortic valve replacement with bioprosthetic valve 01/24/2015   Atrial fibrillation with RVR ([name]) 01/11/2015   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 05/18/2014   Shoulder impingement syndrome (Left) 05/18/2014   Cellulitis and abscess 02/13/2014   MRSA (methicillin resistant Staphylococcus aureus) 02/13/2014   Aneurysm, ascending aorta ([name]) 06/23/2013   Bicuspid aortic valve 06/23/2013    Past Surgical History:  Procedure Laterality Date   ABDOMINAL AORTIC ANEURYSM REPAIR  2008   ABDOMINAL HYSTERECTOMY     AORTIC VALVE REPLACEMENT  2008   CARDIAC CATHETERIZATION     [facility]   CARDIOVERSION N/A 08/15/2018   Procedure: CARDIOVERSION;  Surgeon: Darron Deatrice LABOR, MD;  Location: [facility] ORS;  Service: Cardiovascular;  Laterality: N/A;   CARDIOVERSION N/A 11/14/2018   Procedure: CARDIOVERSION (CATH LAB);  Surgeon: Darron Deatrice LABOR, MD;  Location: [facility];  Service: Cardiovascular;  Laterality: N/A;   CARDIOVERSION N/A 06/05/2019   Procedure: CARDIOVERSION;  Surgeon: Darron Deatrice LABOR, MD;  Location: [facility];  Service: Cardiovascular;  Laterality: N/A;   CARDIOVERSION N/A 08/14/2019   Procedure: CARDIOVERSION;  Surgeon: Darron Deatrice LABOR, MD;  Location: [facility];  Service: Cardiovascular;  Laterality: N/A;   CARDIOVERSION N/A 11/09/2019   Procedure: CARDIOVERSION;  Surgeon: Ammon Blunt,  MD;  Location: [facility];  Service: Cardiovascular;  Laterality: N/A;   CARDIOVERSION N/A 01/17/2020   Procedure: CARDIOVERSION;  Surgeon: Ammon Blunt, MD;  Location: [facility];  Service: Cardiovascular;  Laterality: N/A;   CARPAL TUNNEL RELEASE     ELECTROPHYSIOLOGIC STUDY N/A 08/17/2016   Procedure: Cardioversion;  Surgeon: Deatrice DELENA Cage, MD;  Location: [facility];  Service: Cardiovascular;   Laterality: N/A;   TUMOR EXCISION Left    x3 (arm)    Family History  Problem Relation Age of Onset   Stroke Father    Stroke Paternal Grandmother    Breast cancer Neg Hx     Social History   Socioeconomic History   Marital status: Divorced    Spouse name: Not on file   Number of children: Not on file   Years of education: Not on file   Highest education level: Not on file  Occupational History   Not on file  Tobacco Use   Smoking status: Former    Types: Cigarettes   Smokeless tobacco: Never  Vaping Use   Vaping status: Never Used  Substance and Sexual Activity   Alcohol use: No   Drug use: No   Sexual activity: Not Currently    Birth control/protection: Surgical  Other Topics Concern   Not on file  Social History Narrative   Not on file   Social Drivers of Health   Financial Resource Strain: Low Risk  (11/03/2022)   Received from [facility]   Overall Financial Resource Strain (CARDIA)    Difficulty of Paying Living Expenses: Not hard at all  Recent Concern: Financial Resource Strain - Medium Risk (11/02/2022)   Received from [facility]   Overall Financial Resource Strain (CARDIA)    Difficulty of Paying Living Expenses: Somewhat hard  Food Insecurity: Food Insecurity Present (04/02/2023)   Hunger Vital Sign    Worried About Running Out of Food in the Last Year: Sometimes true    Ran Out of Food in the Last Year: Sometimes true  Transportation Needs: No Transportation Needs (02/02/2023)   PRAPARE - Administrator, Civil Service (Medical): No    Lack of Transportation (Non-Medical): No  Physical Activity: Not on file  Stress: Not on file  Social Connections: Not on file  Intimate Partner Violence: Not At Risk (04/02/2023)   Humiliation, Afraid, Rape, and Kick questionnaire    Fear of Current or Ex-Partner: No    Emotionally Abused: No    Physically Abused: No    Sexually Abused: No    Outpatient Medications  Prior to Visit  Medication Sig Dispense Refill   albuterol  (VENTOLIN  HFA) 108 (90 Base) MCG/ACT inhaler Inhale 2 puffs into the lungs every 6 (six) hours as needed for wheezing or shortness of breath. 18 g 3   ALPRAZolam  (XANAX ) 0.25 MG tablet Take 1 tablet (0.25 mg total) by mouth 2 (two) times daily as needed for anxiety. 60 tablet 2   atorvastatin  (LIPITOR) 40 MG tablet TAKE 1 TABLET BY MOUTH EVERY NIGHT AT BEDTIME 90 tablet 1   BREZTRI  AEROSPHERE 160-9-4.8 MCG/ACT AERO inhaler INHALE 2 PUFFS INTO THE LUNGS TWICE DAILY 10.7 g 11   bumetanide  (BUMEX ) 1 MG tablet TAKE 2 TABLET BY MOUTH TWICE DAILY, ADDITIONAL 2 TABLETS IN THE EVENING AS NEEDED 360 tablet 3   Calcium  Carbonate-Vitamin D  (CALCIUM  600+D PO) Take 1 tablet by mouth daily.     carvedilol  (COREG ) 25 MG tablet Take 25 mg by  mouth 2 (two) times daily with a meal.     Cholecalciferol  (VITAMIN D3) 10 MCG (400 UNIT) CAPS Take by mouth daily.     Coenzyme Q10 (COQ10) 200 MG CAPS Take 200 mg by mouth daily.     diclofenac  Sodium (VOLTAREN ) 1 % GEL Apply 2 g topically 4 (four) times daily.     ELIQUIS  5 MG TABS tablet TAKE 1 TABLET BY MOUTH TWICE DAILY 180 tablet 2   empagliflozin  (JARDIANCE ) 10 MG TABS tablet Take 1 tablet (10 mg total) by mouth daily. 30 tablet 5   gabapentin  (NEURONTIN ) 100 MG capsule TAKE 1 CAPSULE BY MOUTH AT BEDTIME 30 capsule 3   imiquimod  (ALDARA ) 5 % cream Apply pea size amount to genital lesion M, W, F at night, wash in AM with soap and water 12 each 1   levothyroxine  (SYNTHROID ) 25 MCG tablet TAKE 1 TABLET BY MOUTH DAILY BEFORE BREAKFAST 90 tablet 1   lisinopril  (ZESTRIL ) 40 MG tablet TAKE 1 TABLET(40 MG) BY MOUTH DAILY 90 tablet 1   loratadine  (CLARITIN ) 10 MG tablet Take 10 mg by mouth daily.     oxyCODONE -acetaminophen  (PERCOCET) 10-325 MG tablet Take 1 tablet by mouth 2 (two) times daily as needed for pain. 60 tablet 0   oxyCODONE -acetaminophen  (PERCOCET) 10-325 MG tablet Take 1 tablet by mouth 2 (two) times  daily as needed for pain. 60 tablet 0   oxyCODONE -acetaminophen  (PERCOCET) 10-325 MG tablet Take 1 tablet by mouth 2 (two) times daily as needed for pain. 60 tablet 0   pantoprazole  (PROTONIX ) 40 MG tablet TAKE 1 TABLET BY MOUTH EVERY DAY 90 tablet 1   pneumococcal 20-valent conjugate vaccine (PREVNAR 20) 0.5 ML injection Inject 0.5 mLs into the muscle once as needed for up to 1 dose for immunization. 0.5 mL 0   potassium chloride  (KLOR-CON ) 10 MEQ tablet TAKE 4 TABLETS BY MOUTH TWICE DAILY 720 tablet 1   sertraline  (ZOLOFT ) 25 MG tablet Take 1 tablet by mouth daily for 1 week then increase to 2 tablets by mouth daily. 30 tablet 3   sodium chloride  (OCEAN) 0.65 % SOLN nasal spray Place 1 spray into both nostrils as needed for congestion.     traZODone  (DESYREL ) 50 MG tablet Take 1 tablet (50 mg total) by mouth at bedtime. 90 tablet 1   triamcinolone  cream (KENALOG ) 0.1 % Apply 1 Application topically 2 (two) times daily as needed (burning dry skin). 45 g 0   TURMERIC PO Take by mouth daily at 6 (six) AM.     No facility-administered medications prior to visit.      ROS:  Review of Systems  Constitutional:  Negative for fever.  Gastrointestinal:  Negative for blood in stool, constipation, diarrhea, nausea and vomiting.  Genitourinary:  Positive for genital sores. Negative for dyspareunia, dysuria, flank pain, frequency, hematuria, urgency, vaginal bleeding, vaginal discharge and vaginal pain.  Musculoskeletal:  Negative for back pain.  Skin:  Negative for rash.   BREAST: No symptoms   OBJECTIVE:   Vitals:  There were no vitals taken for this visit.  Physical Exam Vitals reviewed.  Constitutional:      Appearance: She is well-developed.  Pulmonary:     Effort: Pulmonary effort is normal.  Genitourinary:    Labia:        Right: Lesion present.        Left: No rash, tenderness or lesion.    Musculoskeletal:        General: Normal range of motion.  Cervical back: Normal  range of motion.  Skin:    General: Skin is warm and dry.  Neurological:     General: No focal deficit present.     Mental Status: She is alert and oriented to person, place, and time.     Cranial Nerves: No cranial nerve deficit.  Psychiatric:        Mood and Affect: Mood normal.        Behavior: Behavior normal.        Thought Content: Thought content normal.        Judgment: Judgment normal.     Media Information   Document Information   Assessment/Plan: Genital warts - Plan: imiquimod  (ALDARA ) 5 % cream; looks like genital warts but given sx hx, recommended bx to rule out VAIN. Pt declines bx. Doesn't want TCA tx due to pain of burning in the past/declines surg exc due to risks of anesthesia. Is willing to try aldara  crm. Rx eRxd, RTO in 8 wks for f/u/sx re-eval. If no improvement or change in pink areas under wart, will encourage bx again.   Sensation of feeling hot--sx do not sound menopausal and given absence of menopausal sx for many yrs, not related. Can't start HRT/veozah at her age. Discussed thyroid , liver, kidneys, other disease processes as causes of sx as well. Pt to f/u with PCP.  Sensation of feeling cold  No orders of the defined types were placed in this encounter.     No follow-ups on file.  Hiilani Jetter B. Carleen Rhue, PA-C 05/02/2024 1:40 PM

## 2024-05-03 ENCOUNTER — Ambulatory Visit: Admitting: Obstetrics and Gynecology

## 2024-05-10 ENCOUNTER — Ambulatory Visit: Admitting: Nurse Practitioner

## 2024-05-10 ENCOUNTER — Encounter: Payer: Self-pay | Admitting: Nurse Practitioner

## 2024-05-10 VITALS — BP 103/53 | HR 74 | Temp 98.1°F | Resp 16 | Ht <= 58 in | Wt 191.0 lb

## 2024-05-10 DIAGNOSIS — K219 Gastro-esophageal reflux disease without esophagitis: Secondary | ICD-10-CM

## 2024-05-10 DIAGNOSIS — E038 Other specified hypothyroidism: Secondary | ICD-10-CM

## 2024-05-10 DIAGNOSIS — Z0000 Encounter for general adult medical examination without abnormal findings: Secondary | ICD-10-CM

## 2024-05-10 DIAGNOSIS — G8929 Other chronic pain: Secondary | ICD-10-CM

## 2024-05-10 DIAGNOSIS — F411 Generalized anxiety disorder: Secondary | ICD-10-CM

## 2024-05-10 DIAGNOSIS — F331 Major depressive disorder, recurrent, moderate: Secondary | ICD-10-CM

## 2024-05-10 DIAGNOSIS — I4821 Permanent atrial fibrillation: Secondary | ICD-10-CM | POA: Diagnosis not present

## 2024-05-10 DIAGNOSIS — I5032 Chronic diastolic (congestive) heart failure: Secondary | ICD-10-CM

## 2024-05-10 DIAGNOSIS — M25511 Pain in right shoulder: Secondary | ICD-10-CM

## 2024-05-10 DIAGNOSIS — E782 Mixed hyperlipidemia: Secondary | ICD-10-CM

## 2024-05-10 DIAGNOSIS — M25512 Pain in left shoulder: Secondary | ICD-10-CM

## 2024-05-10 DIAGNOSIS — F321 Major depressive disorder, single episode, moderate: Secondary | ICD-10-CM

## 2024-05-10 MED ORDER — PANTOPRAZOLE SODIUM 40 MG PO TBEC: 40.0000 mg | DELAYED_RELEASE_TABLET | Freq: Every day | ORAL | 1 refills | Status: DC

## 2024-05-10 MED ORDER — ALPRAZOLAM 0.25 MG PO TABS: 0.2500 mg | ORAL_TABLET | Freq: Two times a day (BID) | ORAL | 2 refills | Status: DC | PRN

## 2024-05-10 MED ORDER — OXYCODONE-ACETAMINOPHEN 10-325 MG PO TABS: 1.0000 | ORAL_TABLET | Freq: Two times a day (BID) | ORAL | 0 refills | Status: DC | PRN

## 2024-05-10 MED ORDER — ATORVASTATIN CALCIUM 40 MG PO TABS: 40.0000 mg | ORAL_TABLET | Freq: Every day | ORAL | 1 refills | Status: DC

## 2024-05-10 MED ORDER — EMPAGLIFLOZIN 10 MG PO TABS: 10.0000 mg | ORAL_TABLET | Freq: Every day | ORAL | 5 refills | Status: DC

## 2024-05-10 MED ORDER — LEVOTHYROXINE SODIUM 50 MCG PO TABS: 50.0000 ug | ORAL_TABLET | Freq: Every day | ORAL | 3 refills | Status: AC

## 2024-05-10 MED ORDER — SERTRALINE HCL 25 MG PO TABS: ORAL_TABLET | ORAL | 3 refills | Status: DC

## 2024-05-10 NOTE — Progress Notes (Signed)
 [facility] [address] [place], [place] [address]  Internal MEDICINE  Office Visit Note  Patient Name: [name]  [id number]  [id number]  Date of Service: 05/10/2024  Chief Complaint  Patient presents with   Hyperlipidemia   Hypertension   Follow-up    HPI [name] presents for a follow-up visit for hypothyroidism, heart failure, and chronic pain, refills, high cholesterol and GERd Hypothyroidism -- takes levothyroxine , due to repeat labs  Heart failure -- taking jardiance  10 mg daily Chronic pain -- takes oxycodone  as needed High cholesterol -- on statin therapy.  GERD -- taking pantoprazole  daily.    Current Medication: Outpatient Encounter Medications as of 05/10/2024  Medication Sig   levothyroxine  (SYNTHROID ) 50 MCG tablet Take 1 tablet (50 mcg total) by mouth daily.   albuterol  (VENTOLIN  HFA) 108 (90 Base) MCG/ACT inhaler Inhale 2 puffs into the lungs every 6 (six) hours as needed for wheezing or shortness of breath.   ALPRAZolam  (XANAX ) 0.25 MG tablet Take 1 tablet (0.25 mg total) by mouth 2 (two) times daily as needed for anxiety.   atorvastatin  (LIPITOR) 40 MG tablet Take 1 tablet (40 mg total) by mouth at bedtime.   BREZTRI  AEROSPHERE 160-9-4.8 MCG/ACT AERO inhaler INHALE 2 PUFFS INTO THE LUNGS TWICE DAILY   bumetanide  (BUMEX ) 1 MG tablet TAKE 2 TABLET BY MOUTH TWICE DAILY, ADDITIONAL 2 TABLETS IN THE EVENING AS NEEDED   Calcium  Carbonate-Vitamin D  (CALCIUM  600+D PO) Take 1 tablet by mouth daily.   carvedilol  (COREG ) 25 MG tablet Take 25 mg by mouth 2 (two) times daily with a meal.   Cholecalciferol  (VITAMIN D3) 10 MCG (400 UNIT) CAPS Take by mouth daily.   Coenzyme Q10 (COQ10) 200 MG CAPS Take 200 mg by mouth daily.   diclofenac  Sodium (VOLTAREN ) 1 % GEL Apply 2 g topically 4 (four) times daily.   ELIQUIS  5 MG TABS tablet TAKE 1 TABLET BY MOUTH TWICE DAILY   empagliflozin  (JARDIANCE ) 10 MG TABS tablet Take 1 tablet (10 mg total) by mouth daily.    gabapentin  (NEURONTIN ) 100 MG capsule TAKE 1 CAPSULE BY MOUTH AT BEDTIME   imiquimod  (ALDARA ) 5 % cream Apply pea size amount to genital lesion M, W, F at night, wash in AM with soap and water   lisinopril  (ZESTRIL ) 40 MG tablet TAKE 1 TABLET(40 MG) BY MOUTH DAILY   loratadine  (CLARITIN ) 10 MG tablet Take 10 mg by mouth daily.   [START ON 07/05/2024] oxyCODONE -acetaminophen  (PERCOCET) 10-325 MG tablet Take 1 tablet by mouth 2 (two) times daily as needed for pain.   [START ON 06/07/2024] oxyCODONE -acetaminophen  (PERCOCET) 10-325 MG tablet Take 1 tablet by mouth 2 (two) times daily as needed for pain.   oxyCODONE -acetaminophen  (PERCOCET) 10-325 MG tablet Take 1 tablet by mouth 2 (two) times daily as needed for pain.   pantoprazole  (PROTONIX ) 40 MG tablet Take 1 tablet (40 mg total) by mouth daily.   potassium chloride  (KLOR-CON ) 10 MEQ tablet TAKE 4 TABLETS BY MOUTH TWICE DAILY   sertraline  (ZOLOFT ) 25 MG tablet Take 1 tablet by mouth daily for 1 week then increase to 2 tablets by mouth daily.   sodium chloride  (OCEAN) 0.65 % SOLN nasal spray Place 1 spray into both nostrils as needed for congestion.   traZODone  (DESYREL ) 50 MG tablet Take 1 tablet (50 mg total) by mouth at bedtime.   triamcinolone  cream (KENALOG ) 0.1 % Apply 1 Application topically 2 (two) times daily as needed (burning dry skin).   TURMERIC PO  Take by mouth daily at 6 (six) AM.   [DISCONTINUED] ALPRAZolam  (XANAX ) 0.25 MG tablet Take 1 tablet (0.25 mg total) by mouth 2 (two) times daily as needed for anxiety.   [DISCONTINUED] atorvastatin  (LIPITOR) 40 MG tablet TAKE 1 TABLET BY MOUTH EVERY NIGHT AT BEDTIME   [DISCONTINUED] empagliflozin  (JARDIANCE ) 10 MG TABS tablet Take 1 tablet (10 mg total) by mouth daily.   [DISCONTINUED] levothyroxine  (SYNTHROID ) 25 MCG tablet TAKE 1 TABLET BY MOUTH DAILY BEFORE BREAKFAST   [DISCONTINUED] oxyCODONE -acetaminophen  (PERCOCET) 10-325 MG tablet Take 1 tablet by mouth 2 (two) times daily as needed for  pain.   [DISCONTINUED] oxyCODONE -acetaminophen  (PERCOCET) 10-325 MG tablet Take 1 tablet by mouth 2 (two) times daily as needed for pain.   [DISCONTINUED] oxyCODONE -acetaminophen  (PERCOCET) 10-325 MG tablet Take 1 tablet by mouth 2 (two) times daily as needed for pain.   [DISCONTINUED] pantoprazole  (PROTONIX ) 40 MG tablet TAKE 1 TABLET BY MOUTH EVERY DAY   [DISCONTINUED] pneumococcal 20-valent conjugate vaccine (PREVNAR 20) 0.5 ML injection Inject 0.5 mLs into the muscle once as needed for up to 1 dose for immunization.   [DISCONTINUED] sertraline  (ZOLOFT ) 25 MG tablet Take 1 tablet by mouth daily for 1 week then increase to 2 tablets by mouth daily.   No facility-administered encounter medications on file as of 05/10/2024.    Surgical History: Past Surgical History:  Procedure Laterality Date   ABDOMINAL AORTIC ANEURYSM REPAIR  2008   ABDOMINAL HYSTERECTOMY     AORTIC VALVE REPLACEMENT  2008   CARDIAC CATHETERIZATION     [facility]   CARDIOVERSION N/A 08/15/2018   Procedure: CARDIOVERSION;  Surgeon: Andrea Andrea LABOR, MD;  Location: [facility] ORS;  Service: Cardiovascular;  Laterality: N/A;   CARDIOVERSION N/A 11/14/2018   Procedure: CARDIOVERSION (CATH LAB);  Surgeon: Andrea Andrea LABOR, MD;  Location: [facility] ORS;  Service: Cardiovascular;  Laterality: N/A;   CARDIOVERSION N/A 06/05/2019   Procedure: CARDIOVERSION;  Surgeon: Andrea Andrea LABOR, MD;  Location: [facility] ORS;  Service: Cardiovascular;  Laterality: N/A;   CARDIOVERSION N/A 08/14/2019   Procedure: CARDIOVERSION;  Surgeon: Andrea Andrea LABOR, MD;  Location: [facility] ORS;  Service: Cardiovascular;  Laterality: N/A;   CARDIOVERSION N/A 11/09/2019   Procedure: CARDIOVERSION;  Surgeon: Andrea Blunt, MD;  Location: [facility] ORS;  Service: Cardiovascular;  Laterality: N/A;   CARDIOVERSION N/A 01/17/2020   Procedure: CARDIOVERSION;  Surgeon: Andrea Blunt, MD;  Location: [facility] ORS;  Service: Cardiovascular;  Laterality: N/A;   CARPAL TUNNEL RELEASE      ELECTROPHYSIOLOGIC STUDY N/A 08/17/2016   Procedure: Cardioversion;  Surgeon: [name] Darron, MD;  Location: [facility] ORS;  Service: Cardiovascular;  Laterality: N/A;   TUMOR EXCISION Left    x3 (arm)    Medical History: Past Medical History:  Diagnosis Date   Aortic 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 s/p bioprosthetic valve replacement 2008 at [facility];  b. 01/2015 Echo: EF 60-65%, no rwma, Gr1 DD, mildly dil LA, nl RV fxn.   Aspiration pneumonia ([name])    Atrial fibrillation with RVR ([name]) 01/11/2015   Atrial flutter ([name])    a. 08/2016 s/p DCCV.  Remains on flecainide  50 mg bid.   Basal skull fracture ([name]) 20 yrs ago   CHF (congestive heart failure) ([name])    Chronic respiratory failure ([name])    COPD (chronic obstructive pulmonary disease) ([name])    a. 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 2008 prior to Aortic aneurysm repair-->nl cors.   History of stress test    a. 10/2015 MV: no ischemia/infarct.   HLD (hyperlipidemia)    HTN (hypertension)    Hypothyroidism    Obesity    PAF (paroxysmal atrial fibrillation) ([name])    a. on Eliquis ; b. CHADS2VASc = 3 (HTN, age x 1, female).   Paroxysmal atrial fibrillation ([name]) 01/24/2015   Right upper quadrant abdominal tenderness without rebound tenderness 03/02/2018   S/P ascending aortic aneurysm repair 2008    Family History: Family History  Problem Relation Age of Onset   Stroke Father    Stroke Paternal Grandmother    Breast cancer Neg Hx     Social History   Socioeconomic History   Marital status: Divorced    Spouse name: Not on file   Number of children: Not on file   Years of education: Not on file   Highest education level: Not on file  Occupational History   Not on file  Tobacco Use   Smoking status: Former    Types: Cigarettes   Smokeless tobacco: Never  Vaping Use   Vaping status: Never  Used  Substance and Sexual Activity   Alcohol use: No   Drug use: No   Sexual activity: Not Currently    Birth control/protection: Surgical  Other Topics Concern   Not on file  Social History Narrative   Not on file   Social Drivers of Health   Financial Resource Strain: Low Risk  (11/03/2022)   Received from [facility]   Overall Financial Resource Strain (CARDIA)    Difficulty of Paying Living Expenses: Not hard at all  Recent Concern: Financial Resource Strain - Medium Risk (11/02/2022)   Received from [facility]   Overall Financial Resource Strain (CARDIA)    Difficulty of Paying Living Expenses: Somewhat hard  Food Insecurity: Food Insecurity Present (04/02/2023)   Hunger Vital Sign    Worried About Running Out of Food in the Last Year: Sometimes true    Ran Out of Food in the Last Year: Sometimes true  Transportation Needs: No Transportation Needs (02/02/2023)   PRAPARE - Administrator, Civil Service (Medical): No    Lack of Transportation (Non-Medical): No  Physical Activity: Not on file  Stress: Not on file  Social Connections: Not on file  Intimate Partner Violence: Not At Risk (04/02/2023)   Humiliation, Afraid, Rape, and Kick questionnaire    Fear of Current or Ex-Partner: No    Emotionally Abused: No    Physically Abused: No    Sexually Abused: No      Review of Systems  Constitutional:  Positive for activity change, fatigue and unexpected weight change.  Respiratory:  Positive for cough, chest tightness, shortness of breath and wheezing.        Symptoms are persistent at baseline   Cardiovascular:  Positive for leg swelling. Negative for chest pain and palpitations.  Gastrointestinal: Negative.   Genitourinary: Negative.   Musculoskeletal:  Positive for arthralgias, back pain, gait problem, joint swelling, myalgias and neck pain.  Skin: Negative.   Neurological:  Positive for weakness.   Psychiatric/Behavioral:  Positive for behavioral problems. Negative for self-injury, sleep disturbance and suicidal ideas. The patient is nervous/anxious.     Vital Signs: BP (!) 103/53   Pulse 74   Temp 98.1 F (36.7 C)  Resp 16   Ht 4' 10 (1.473 m)   Wt 191 lb (86.6 kg)   SpO2 95% Comment: 5L  BMI 39.92 kg/m    Physical Exam Vitals reviewed.  Constitutional:      General: She is not in acute distress.    Appearance: Normal appearance. She is obese. She is not ill-appearing.  HENT:     Head: Normocephalic and atraumatic.  Eyes:     Pupils: Pupils are equal, round, and reactive to light.  Cardiovascular:     Rate and Rhythm: Normal rate and regular rhythm.  Pulmonary:     Effort: Pulmonary effort is normal. No respiratory distress.  Neurological:     Mental Status: She is alert and oriented to person, place, and time.  Psychiatric:        Mood and Affect: Mood normal.        Behavior: Behavior normal.        Assessment/Plan: 1. CHF (congestive heart failure), NYHA class IV, chronic, diastolic ([name]) (Primary) Continue jardiance  as prescribed.  - empagliflozin  (JARDIANCE ) 10 MG TABS tablet; Take 1 tablet (10 mg total) by mouth daily.  Dispense: 30 tablet; Refill: 5  2. Permanent atrial fibrillation ([name]) Continue medications as prescribed. Follow up with cardiology as scheduled   3. Other specified hypothyroidism Thyroid  labs ordered, continue levothyroxine  as prescribed.  - TSH + free T4 - levothyroxine  (SYNTHROID ) 50 MCG tablet; Take 1 tablet (50 mcg total) by mouth daily.  Dispense: 90 tablet; Refill: 3  4. Mixed hyperlipidemia Continue atorvastatin  as prescribed.  - atorvastatin  (LIPITOR) 40 MG tablet; Take 1 tablet (40 mg total) by mouth at bedtime.  Dispense: 90 tablet; Refill: 1  5. Chronic pain of both shoulders Continue prn oxycodone  as prescribed.  - oxyCODONE -acetaminophen  (PERCOCET) 10-325 MG tablet; Take 1 tablet by mouth 2 (two) times daily  as needed for pain.  Dispense: 60 tablet; Refill: 0 - oxyCODONE -acetaminophen  (PERCOCET) 10-325 MG tablet; Take 1 tablet by mouth 2 (two) times daily as needed for pain.  Dispense: 60 tablet; Refill: 0 - oxyCODONE -acetaminophen  (PERCOCET) 10-325 MG tablet; Take 1 tablet by mouth 2 (two) times daily as needed for pain.  Dispense: 60 tablet; Refill: 0  6. Gastroesophageal reflux disease without esophagitis Continue pantoprazole  as prescribed  - pantoprazole  (PROTONIX ) 40 MG tablet; Take 1 tablet (40 mg total) by mouth daily.  Dispense: 90 tablet; Refill: 1  7. Moderate episode of recurrent major depressive disorder ([name]) Continue sertraline  as prescribed  - sertraline  (ZOLOFT ) 25 MG tablet; Take 1 tablet by mouth daily for 1 week then increase to 2 tablets by mouth daily.  Dispense: 30 tablet; Refill: 3  8. GAD (generalized anxiety disorder) Continue alprazolam  and sertraline  as prescribed. Follow up in 3 months for additional refills of alprazolam   - ALPRAZolam  (XANAX ) 0.25 MG tablet; Take 1 tablet (0.25 mg total) by mouth 2 (two) times daily as needed for anxiety.  Dispense: 60 tablet; Refill: 2 - sertraline  (ZOLOFT ) 25 MG tablet; Take 1 tablet by mouth daily for 1 week then increase to 2 tablets by mouth daily.  Dispense: 30 tablet; Refill: 3   General Counseling: Louetta verbalizes understanding of the findings of todays visit and agrees with plan of treatment. I have discussed any further diagnostic evaluation that may be needed or ordered today. We also reviewed her medications today. she has been encouraged to call the office with any questions or concerns that should arise related to todays visit.    Orders Placed This  Encounter  Procedures   TSH + free T4    Meds ordered this encounter  Medications   ALPRAZolam  (XANAX ) 0.25 MG tablet    Sig: Take 1 tablet (0.25 mg total) by mouth 2 (two) times daily as needed for anxiety.    Dispense:  60 tablet    Refill:  2    For future  refills   oxyCODONE -acetaminophen  (PERCOCET) 10-325 MG tablet    Sig: Take 1 tablet by mouth 2 (two) times daily as needed for pain.    Dispense:  60 tablet    Refill:  0    Refill for October 29   oxyCODONE -acetaminophen  (PERCOCET) 10-325 MG tablet    Sig: Take 1 tablet by mouth 2 (two) times daily as needed for pain.    Dispense:  60 tablet    Refill:  0    Refill for October 1   oxyCODONE -acetaminophen  (PERCOCET) 10-325 MG tablet    Sig: Take 1 tablet by mouth 2 (two) times daily as needed for pain.    Dispense:  60 tablet    Refill:  0    Fill for sept 3   pantoprazole  (PROTONIX ) 40 MG tablet    Sig: Take 1 tablet (40 mg total) by mouth daily.    Dispense:  90 tablet    Refill:  1   sertraline  (ZOLOFT ) 25 MG tablet    Sig: Take 1 tablet by mouth daily for 1 week then increase to 2 tablets by mouth daily.    Dispense:  30 tablet    Refill:  3   empagliflozin  (JARDIANCE ) 10 MG TABS tablet    Sig: Take 1 tablet (10 mg total) by mouth daily.    Dispense:  30 tablet    Refill:  5    Patient needs appt for further refills.   atorvastatin  (LIPITOR) 40 MG tablet    Sig: Take 1 tablet (40 mg total) by mouth at bedtime.    Dispense:  90 tablet    Refill:  1   levothyroxine  (SYNTHROID ) 50 MCG tablet    Sig: Take 1 tablet (50 mcg total) by mouth daily.    Dispense:  90 tablet    Refill:  3    Note increased dose, Fill new script today, discontinue 25 mcg dose.    Return in about 8 weeks (around 07/05/2024) for F/U, pain med refill, anxiety med refill, Earvin Blazier PCP.   Total time spent:30 Minutes Time spent includes review of chart, medications, test results, and follow up plan with the patient.   [place] Controlled Substance Database was reviewed by me.  This patient was seen by Mardy Maxin, FNP-C in collaboration with Dr. Sigrid Bathe as a part of collaborative care agreement.   Nikisha Fleece R. Maxin, MSN, FNP-C Internal medicine

## 2024-05-12 ENCOUNTER — Telehealth: Payer: Self-pay | Admitting: Family

## 2024-05-12 NOTE — Telephone Encounter (Signed)
 Called to confirm/remind patient of [facility] on 05/12/24.   Appointment:   [] Confirmed  [x] Left mess   [] No answer/No voice mail  [] VM Full/unable to leave message  [] Phone not in service  Patient reminded to bring all medications and/or complete list.  Confirmed patient has transportation. Gave directions, instructed to utilize valet parking.

## 2024-05-15 ENCOUNTER — Ambulatory Visit: Attending: Family | Admitting: Family

## 2024-05-15 ENCOUNTER — Encounter: Payer: Self-pay | Admitting: Family

## 2024-05-15 VITALS — BP 109/74 | HR 70 | Wt 189.0 lb

## 2024-05-15 DIAGNOSIS — Z85118 Personal history of other malignant neoplasm of bronchus and lung: Secondary | ICD-10-CM | POA: Insufficient documentation

## 2024-05-15 DIAGNOSIS — Z87891 Personal history of nicotine dependence: Secondary | ICD-10-CM | POA: Insufficient documentation

## 2024-05-15 DIAGNOSIS — Z9889 Other specified postprocedural states: Secondary | ICD-10-CM | POA: Insufficient documentation

## 2024-05-15 DIAGNOSIS — Z79899 Other long term (current) drug therapy: Secondary | ICD-10-CM | POA: Diagnosis not present

## 2024-05-15 DIAGNOSIS — I428 Other cardiomyopathies: Secondary | ICD-10-CM | POA: Diagnosis not present

## 2024-05-15 DIAGNOSIS — E785 Hyperlipidemia, unspecified: Secondary | ICD-10-CM | POA: Insufficient documentation

## 2024-05-15 DIAGNOSIS — D649 Anemia, unspecified: Secondary | ICD-10-CM | POA: Insufficient documentation

## 2024-05-15 DIAGNOSIS — I10 Essential (primary) hypertension: Secondary | ICD-10-CM

## 2024-05-15 DIAGNOSIS — Z7984 Long term (current) use of oral hypoglycemic drugs: Secondary | ICD-10-CM | POA: Insufficient documentation

## 2024-05-15 DIAGNOSIS — I480 Paroxysmal atrial fibrillation: Secondary | ICD-10-CM | POA: Diagnosis not present

## 2024-05-15 DIAGNOSIS — H918X2 Other specified hearing loss, left ear: Secondary | ICD-10-CM | POA: Diagnosis not present

## 2024-05-15 DIAGNOSIS — Z923 Personal history of irradiation: Secondary | ICD-10-CM | POA: Insufficient documentation

## 2024-05-15 DIAGNOSIS — I110 Hypertensive heart disease with heart failure: Secondary | ICD-10-CM | POA: Diagnosis not present

## 2024-05-15 DIAGNOSIS — Z952 Presence of prosthetic heart valve: Secondary | ICD-10-CM | POA: Insufficient documentation

## 2024-05-15 DIAGNOSIS — J449 Chronic obstructive pulmonary disease, unspecified: Secondary | ICD-10-CM | POA: Insufficient documentation

## 2024-05-15 DIAGNOSIS — Z950 Presence of cardiac pacemaker: Secondary | ICD-10-CM | POA: Diagnosis not present

## 2024-05-15 DIAGNOSIS — F419 Anxiety disorder, unspecified: Secondary | ICD-10-CM | POA: Diagnosis not present

## 2024-05-15 DIAGNOSIS — Z8673 Personal history of transient ischemic attack (TIA), and cerebral infarction without residual deficits: Secondary | ICD-10-CM | POA: Insufficient documentation

## 2024-05-15 DIAGNOSIS — I5032 Chronic diastolic (congestive) heart failure: Secondary | ICD-10-CM | POA: Diagnosis not present

## 2024-05-15 DIAGNOSIS — Z9981 Dependence on supplemental oxygen: Secondary | ICD-10-CM | POA: Insufficient documentation

## 2024-05-15 DIAGNOSIS — I4891 Unspecified atrial fibrillation: Secondary | ICD-10-CM | POA: Insufficient documentation

## 2024-05-15 DIAGNOSIS — E079 Disorder of thyroid, unspecified: Secondary | ICD-10-CM | POA: Diagnosis not present

## 2024-05-15 DIAGNOSIS — R0602 Shortness of breath: Secondary | ICD-10-CM | POA: Diagnosis not present

## 2024-05-15 NOTE — Progress Notes (Signed)
 Advanced Heart Failure Clinic Note    PCP: [name], NP Cardiologist: Ammon Blunt, MD (last seen 04/25; returns 09/25)  Chief Complaint: shortness of breath   HPI:  [name] is a 78 y/o female with a history of hyperlipidemia, HTN, thyroid  disease, lung cancer, severe COPD, left ear deafness, atrial fibrillation, aortic stenosis s/p aortic valve replacement with bioprosthetic valve in 2008, s/p TAVR on 11/03/2022 at [facility] ([place]), ascending aortic aneurysm s/p repair, history of AV node ablation and dual-chamber pacemaker placement in June 2021, stroke, lung cancer, anxiety, chronic anemia, previous tobacco use and chronic heart failure.   Recurrent atrial fibrillation since 10/24/2019. She was evaluated by Dr. Rosine at [facility], transiently on flecainide  100 mg twice daily with resultant QRS duration increased from 98 Andrea to 150 Andrea, and was therefore decreased to 50 mg twice daily. She was also on diltiazem  for rate control   Echo 02/07/20: EF of >55% along with severe LAE.   Hospitalized 05/11/2020 with respiratory failure secondary to COPD exacerbation and chronic diastolic congestive heart failure.   Echo 05/16/21: EF of 45% along with mild LVH.  Hospitalized 09/09/2021 with respiratory failure, which was likely multifactorial, with underlying severe COPD on 4 L O2 by nasal cannula, element of acute on chronic diastolic congestive heart failure, BNP on admission was only 170.   Echo 11/11/21: EF of 60-65%.  Was in the ED 07/10/22 due to upper abdominal pain. Serum labs are all normal. Chest x-ray and right upper quadrant ultrasound also unremarkable.  She has known cholelithiasis which is again demonstrated today.   Echo 07/24/22: EF of >55% along with mild LVH.  Admitted 08/04/22 due to worsening shortness of breath with exertion from her baseline associated with increased abdominal girth and orthopnea for about 4 days. Initially given IV lasix  with transition to oral diuretics.  Cardiology consult obtained. Discharged after 3 days.   Cath 10/19/22: Reduced cardiac index at rest with PCWP 13 mmHg.  Moderate increase in PA pressure; PVR 3.9 Wu.  RA pressure 8 mm Hg. No obstructive CAD; left dominant system.   Admitted 11/02/22 due to TAVR. Echo 11/04/22: EF >55%, moderate LVH      Admitted 02/01/23 due to HF exacerbation. Given IV bumex .     She presents today for a HF follow-up visit, although hasn't been seen since 07/25, with a chief complaint of moderate shortness of breath. Has associated fatigue, dizziness at times, bilateral shoulder pain. She has finished her radiation for her lung cancer. Frustrated with her weight but she gets too short of breath to exercise so all she does is eat. Is not interested in GLP1's.   Oxygen  is at 5L although her portable tank is a pulse tank and she feels like it doesn't work as fast as the continuous cylinders.   ROS: All systems negative except what is listed in HPI, PMH and Problem List  Past Medical History:  Diagnosis Date   Aortic 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 s/p bioprosthetic valve replacement 2008 at [facility];  b. 01/2015 Echo: EF 60-65%, no rwma, Gr1 DD, mildly dil LA, nl RV fxn.   Aspiration pneumonia ([name])    Atrial fibrillation with RVR ([name]) 01/11/2015   Atrial flutter ([name])    a. 08/2016 s/p DCCV.  Remains on flecainide  50 mg bid.   Basal skull fracture ([name]) 20 yrs ago   CHF (congestive heart failure) ([name])    Chronic respiratory failure ([name])    COPD (chronic  obstructive pulmonary disease) ([name])    a. 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 2008 prior to Aortic aneurysm repair-->nl cors.   History of stress test    a. 10/2015 MV: no ischemia/infarct.   HLD (hyperlipidemia)    HTN (hypertension)    Hypothyroidism    Obesity    PAF (paroxysmal atrial fibrillation) ([name])    a. on  Eliquis ; b. CHADS2VASc = 3 (HTN, age x 1, female).   Paroxysmal atrial fibrillation ([name]) 01/24/2015   Right upper quadrant abdominal tenderness without rebound tenderness 03/02/2018   S/P ascending aortic aneurysm repair 2008    Current Outpatient Medications  Medication Sig Dispense Refill   albuterol  (VENTOLIN  HFA) 108 (90 Base) MCG/ACT inhaler Inhale 2 puffs into the lungs every 6 (six) hours as needed for wheezing or shortness of breath. 18 g 3   ALPRAZolam  (XANAX ) 0.25 MG tablet Take 1 tablet (0.25 mg total) by mouth 2 (two) times daily as needed for anxiety. 60 tablet 2   atorvastatin  (LIPITOR) 40 MG tablet Take 1 tablet (40 mg total) by mouth at bedtime. 90 tablet 1   BREZTRI  AEROSPHERE 160-9-4.8 MCG/ACT AERO inhaler INHALE 2 PUFFS INTO THE LUNGS TWICE DAILY 10.7 g 11   bumetanide  (BUMEX ) 1 MG tablet TAKE 2 TABLET BY MOUTH TWICE DAILY, ADDITIONAL 2 TABLETS IN THE EVENING AS NEEDED 360 tablet 3   Calcium  Carbonate-Vitamin D  (CALCIUM  600+D PO) Take 1 tablet by mouth daily.     carvedilol  (COREG ) 25 MG tablet Take 25 mg by mouth 2 (two) times daily with a meal.     Cholecalciferol  (VITAMIN D3) 10 MCG (400 UNIT) CAPS Take by mouth daily.     Coenzyme Q10 (COQ10) 200 MG CAPS Take 200 mg by mouth daily.     diclofenac  Sodium (VOLTAREN ) 1 % GEL Apply 2 g topically 4 (four) times daily.     ELIQUIS  5 MG TABS tablet TAKE 1 TABLET BY MOUTH TWICE DAILY 180 tablet 2   empagliflozin  (JARDIANCE ) 10 MG TABS tablet Take 1 tablet (10 mg total) by mouth daily. 30 tablet 5   gabapentin  (NEURONTIN ) 100 MG capsule TAKE 1 CAPSULE BY MOUTH AT BEDTIME 30 capsule 3   imiquimod  (ALDARA ) 5 % cream Apply pea size amount to genital lesion M, W, F at night, wash in AM with soap and water 12 each 1   levothyroxine  (SYNTHROID ) 50 MCG tablet Take 1 tablet (50 mcg total) by mouth daily. 90 tablet 3   lisinopril  (ZESTRIL ) 40 MG tablet TAKE 1 TABLET(40 MG) BY MOUTH DAILY 90 tablet 1   loratadine  (CLARITIN ) 10 MG tablet  Take 10 mg by mouth daily.     [START ON 07/05/2024] oxyCODONE -acetaminophen  (PERCOCET) 10-325 MG tablet Take 1 tablet by mouth 2 (two) times daily as needed for pain. 60 tablet 0   [START ON 06/07/2024] oxyCODONE -acetaminophen  (PERCOCET) 10-325 MG tablet Take 1 tablet by mouth 2 (two) times daily as needed for pain. 60 tablet 0   oxyCODONE -acetaminophen  (PERCOCET) 10-325 MG tablet Take 1 tablet by mouth 2 (two) times daily as needed for pain. 60 tablet 0   pantoprazole  (PROTONIX ) 40 MG tablet Take 1 tablet (40 mg total) by mouth daily. 90 tablet 1   potassium chloride  (KLOR-CON ) 10 MEQ tablet TAKE 4 TABLETS BY MOUTH TWICE DAILY 720 tablet 1  sertraline  (ZOLOFT ) 25 MG tablet Take 1 tablet by mouth daily for 1 week then increase to 2 tablets by mouth daily. 30 tablet 3   sodium chloride  (OCEAN) 0.65 % SOLN nasal spray Place 1 spray into both nostrils as needed for congestion.     traZODone  (DESYREL ) 50 MG tablet Take 1 tablet (50 mg total) by mouth at bedtime. 90 tablet 1   triamcinolone  cream (KENALOG ) 0.1 % Apply 1 Application topically 2 (two) times daily as needed (burning dry skin). 45 g 0   TURMERIC PO Take by mouth daily at 6 (six) AM.     No current facility-administered medications for this visit.    Allergies  Allergen Reactions   Benadryl [Diphenhydramine Hcl (Sleep)] Palpitations   Cetirizine Palpitations   Lasix  [Furosemide ] Rash   Levaquin [Levofloxacin In D5w] Other (See Comments)    Reaction:  Fatigue and muscle soreness   Meloxicam Rash   Sulfa Antibiotics Rash      Social History   Socioeconomic History   Marital status: Divorced    Spouse name: Not on file   Number of children: Not on file   Years of education: Not on file   Highest education level: Not on file  Occupational History   Not on file  Tobacco Use   Smoking status: Former    Types: Cigarettes   Smokeless tobacco: Never  Vaping Use   Vaping status: Never Used  Substance and Sexual Activity    Alcohol use: No   Drug use: No   Sexual activity: Not Currently    Birth control/protection: Surgical  Other Topics Concern   Not on file  Social History Narrative   Not on file   Social Drivers of Health   Financial Resource Strain: Low Risk  (11/03/2022)   Received from [facility]   Overall Financial Resource Strain (CARDIA)    Difficulty of Paying Living Expenses: Not hard at all  Recent Concern: Financial Resource Strain - Medium Risk (11/02/2022)   Received from [facility]   Overall Financial Resource Strain (CARDIA)    Difficulty of Paying Living Expenses: Somewhat hard  Food Insecurity: Food Insecurity Present (04/02/2023)   Hunger Vital Sign    Worried About Running Out of Food in the Last Year: Sometimes true    Ran Out of Food in the Last Year: Sometimes true  Transportation Needs: No Transportation Needs (02/02/2023)   PRAPARE - Administrator, Civil Service (Medical): No    Lack of Transportation (Non-Medical): No  Physical Activity: Not on file  Stress: Not on file  Social Connections: Not on file  Intimate Partner Violence: Not At Risk (04/02/2023)   Humiliation, Afraid, Rape, and Kick questionnaire    Fear of Current or Ex-Partner: No    Emotionally Abused: No    Physically Abused: No    Sexually Abused: No      Family History  Problem Relation Age of Onset   Stroke Father    Stroke Paternal Grandmother    Breast cancer Neg Hx    Vitals:   05/15/24 1448 05/15/24 1449  BP: 109/74   Pulse: 70 70  SpO2: (!) 84% 94%  Weight: 189 lb (85.7 kg)    Wt Readings from Last 3 Encounters:  05/15/24 189 lb (85.7 kg)  05/10/24 191 lb (86.6 kg)  04/25/24 192 lb (87.1 kg)   Lab Results  Component Value Date   CREATININE 0.90 04/04/2024   CREATININE 1.01 (  H) 01/02/2024   CREATININE 0.90 07/09/2023    PHYSICAL EXAM:  General: Well appearing, female in wheelchair. HOH but can lip read  Cor: No JVD. Regular  rhythm, rate.  Lungs: clear Abdomen: soft, nontender, nondistended. Extremities: no edema Neuro:. Affect pleasant   ECG: not done   ASSESSMENT & PLAN:  1: NICM with preserved ejection fraction- - suspect due to severe COPD/ AF - NYHA class III - euvolemic today - weight 189 pounds at home this morning - echo 11/04/22: EF >55%, moderate LVH Echo 07/24/22: EF of >55% along with mild LVH. Echo 11/11/21: EF of 60-65%. Echo 05/16/21: EF of 45% along with mild LVH. Echo 02/07/20: EF of >55% along with severe LAE.  - Cath 10/19/22: Reduced cardiac index at rest with PCWP 13 mmHg.  Moderate increase in PA pressure; PVR 3.9 Wu.  RA pressure 8 mm Hg. No obstructive CAD; left dominant system. - not adding salt and is using pepper for seasoning; reviewed the importance of closely following a low sodium diet  - doesn't cook so eating frozen/ easily prepared foods such as chicken nuggets, pizza etc; does look at food labels and emphasized that she keep her daily sodium to 2000mg  - continue bumex  2mg  BID/ potassium 40meq BID - continue carvedilol  25mg  BID - continue jardiance  10mg  daily - continue lisinopril  40mg  daily - BP may not tolerate MRA - BNP 02/01/23 was 330.6  2: HTN- - BP 109/74 - saw PCP (Abernathy) 09/25 - BMP 04/04/24 reviewed: sodium 137, potassium 4.1, creatinine 0.9 and GFR 66  3: severe COPD- - wearing oxygen  at 5L around the clock at home; when she comes to appointments, she has to use her pulse tank and she doesn't feel like she's getting as much oxygen  this way - saw pulmonology Orvil) 09/25  4: Atrial fibrillation-  - dual chamber pacemaker placed June 2021 - has had multiple cardioversions - apixaban  5mg  BID - saw cardiology (Paraschos) 04/25; returns 09/25  5: TAVR- - s/p Wheat and hemiarch procedure with a #32mm CE Magna bovine pericardial valve on 03/30/2007 for bicuspid aortic valve syndrome  - saw cardiothoracic provider Marie) 10/19/22 - TAVR done  11/02/22   Return in 6 months, sooner if needed.   Ellouise DELENA Class, FNP 05/15/24

## 2024-05-29 ENCOUNTER — Other Ambulatory Visit: Payer: Self-pay | Admitting: Nurse Practitioner

## 2024-05-29 ENCOUNTER — Telehealth: Payer: Self-pay

## 2024-05-29 DIAGNOSIS — R61 Generalized hyperhidrosis: Secondary | ICD-10-CM

## 2024-05-29 MED ORDER — GABAPENTIN 100 MG PO CAPS: 100.0000 mg | ORAL_CAPSULE | Freq: Every day | ORAL | 1 refills | Status: DC

## 2024-05-29 NOTE — Telephone Encounter (Signed)
 Spoke with [facility] pt reschedule  her evaluation date when they call her I reopened today again today and they will call pt and also pt advised that they will call her

## 2024-05-29 NOTE — Telephone Encounter (Signed)
 [facility] called back that pt refused to go there facility for evaluation and due to her insurance not covered telly visit

## 2024-05-31 DIAGNOSIS — H04123 Dry eye syndrome of bilateral lacrimal glands: Secondary | ICD-10-CM | POA: Diagnosis not present

## 2024-05-31 DIAGNOSIS — H2513 Age-related nuclear cataract, bilateral: Secondary | ICD-10-CM | POA: Diagnosis not present

## 2024-06-01 ENCOUNTER — Ambulatory Visit: Admitting: Obstetrics and Gynecology

## 2024-06-06 ENCOUNTER — Other Ambulatory Visit: Payer: Self-pay

## 2024-06-06 DIAGNOSIS — R61 Generalized hyperhidrosis: Secondary | ICD-10-CM

## 2024-06-06 MED ORDER — GABAPENTIN 100 MG PO CAPS: 100.0000 mg | ORAL_CAPSULE | Freq: Every day | ORAL | 1 refills | Status: DC

## 2024-06-13 ENCOUNTER — Ambulatory Visit (INDEPENDENT_AMBULATORY_CARE_PROVIDER_SITE_OTHER): Admitting: Internal Medicine

## 2024-06-13 VITALS — BP 118/51 | HR 75 | Temp 98.4°F | Resp 16 | Ht <= 58 in | Wt 193.0 lb

## 2024-06-13 DIAGNOSIS — Z789 Other specified health status: Secondary | ICD-10-CM | POA: Diagnosis not present

## 2024-06-13 DIAGNOSIS — Z6841 Body Mass Index (BMI) 40.0 and over, adult: Secondary | ICD-10-CM

## 2024-06-13 DIAGNOSIS — Z7409 Other reduced mobility: Secondary | ICD-10-CM | POA: Diagnosis not present

## 2024-06-13 DIAGNOSIS — J9611 Chronic respiratory failure with hypoxia: Secondary | ICD-10-CM

## 2024-06-13 DIAGNOSIS — E66813 Obesity, class 3: Secondary | ICD-10-CM | POA: Diagnosis not present

## 2024-06-13 DIAGNOSIS — G894 Chronic pain syndrome: Secondary | ICD-10-CM

## 2024-06-13 DIAGNOSIS — C3411 Malignant neoplasm of upper lobe, right bronchus or lung: Secondary | ICD-10-CM

## 2024-06-13 DIAGNOSIS — R911 Solitary pulmonary nodule: Secondary | ICD-10-CM

## 2024-06-13 DIAGNOSIS — I5032 Chronic diastolic (congestive) heart failure: Secondary | ICD-10-CM

## 2024-06-13 NOTE — Progress Notes (Unsigned)
 [facility] [address] [place], [place] [address]  Internal MEDICINE  Office Visit Note  Patient Name: [name]  [id number]  [id number]  Date of Service: 06/13/2024  Chief Complaint  Patient presents with   Follow-up   Hypertension   Hyperlipidemia   Referral    Home Health    HPI  Pt is seen for routine f/u with multiple complaints  Unable to ambulate due to pain and SOB, bilateral shoulder pain, unable to take a bath Pt is on 5L oxygen , desaturates on it with minimum activity  She is followed by cardiology for problematic paroxysmal atrial fibrillation, status post 5 cardioversions more recently 01/15/2020. The patient underwent successful AV node ablation with dual-chamber pacemaker implantation on 02/07/2020. 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   ALPRAZolam  (XANAX ) 0.25 MG tablet Take 1 tablet (0.25 mg total) by mouth 2 (two) times daily as needed for anxiety.   atorvastatin  (LIPITOR) 40 MG tablet Take 1 tablet (40 mg total) by mouth at bedtime.   BREZTRI  AEROSPHERE 160-9-4.8 MCG/ACT AERO inhaler INHALE 2 PUFFS INTO THE LUNGS TWICE DAILY   bumetanide  (BUMEX ) 1 MG tablet TAKE 2 TABLET BY MOUTH TWICE DAILY, ADDITIONAL 2 TABLETS IN THE EVENING AS NEEDED   Calcium  Carbonate-Vitamin D  (CALCIUM  600+D PO) Take 1 tablet by mouth daily.   carvedilol  (COREG ) 25 MG tablet Take 25 mg by mouth 2 (two) times daily with a meal.   Cholecalciferol  (VITAMIN D3) 10 MCG (400 UNIT) CAPS Take by mouth daily.   Coenzyme Q10 (COQ10) 200 MG CAPS Take 200 mg by mouth daily.    diclofenac  Sodium (VOLTAREN ) 1 % GEL Apply 2 g topically 4 (four) times daily.   ELIQUIS  5 MG TABS tablet TAKE 1 TABLET BY MOUTH TWICE DAILY   empagliflozin  (JARDIANCE ) 10 MG TABS tablet Take 1 tablet (10 mg total) by mouth daily.   gabapentin  (NEURONTIN ) 100 MG capsule Take 1 capsule (100 mg total) by mouth at bedtime.   levothyroxine  (SYNTHROID ) 50 MCG tablet Take 1 tablet (50 mcg total) by mouth daily.   lisinopril  (ZESTRIL ) 40 MG tablet TAKE 1 TABLET(40 MG) BY MOUTH DAILY   loratadine  (CLARITIN ) 10 MG tablet Take 10 mg by mouth daily.   [START ON 07/05/2024] oxyCODONE -acetaminophen  (PERCOCET) 10-325 MG tablet Take 1 tablet by mouth 2 (two) times daily as needed for pain.   oxyCODONE -acetaminophen  (PERCOCET) 10-325 MG tablet Take 1 tablet by mouth 2 (two) times daily as needed for pain.   oxyCODONE -acetaminophen  (PERCOCET) 10-325 MG tablet Take 1 tablet by mouth 2 (two) times daily as needed for pain.   pantoprazole  (PROTONIX ) 40 MG tablet Take 1 tablet (40 mg total) by mouth daily.   potassium chloride  (KLOR-CON ) 10 MEQ tablet TAKE 4 TABLETS BY MOUTH TWICE DAILY   sertraline  (ZOLOFT ) 25 MG tablet Take 1 tablet by mouth daily for 1 week then increase to 2 tablets by mouth daily.   sodium chloride  (OCEAN) 0.65 % SOLN nasal spray Place 1 spray into both nostrils as needed for congestion.   traZODone  (DESYREL ) 50 MG  tablet Take 1 tablet (50 mg total) by mouth at bedtime.   triamcinolone  cream (KENALOG ) 0.1 % Apply 1 Application topically 2 (two) times daily as needed (burning dry skin).   TURMERIC PO Take by mouth daily at 6 (six) AM.   [DISCONTINUED] imiquimod  (ALDARA ) 5 % cream Apply pea size amount to genital lesion M, W, F at night, wash in AM with soap and water   No facility-administered encounter medications on file as of 06/13/2024.    Surgical History: Past Surgical History:  Procedure Laterality Date   ABDOMINAL AORTIC ANEURYSM REPAIR  2008   ABDOMINAL HYSTERECTOMY     AORTIC  VALVE REPLACEMENT  2008   CARDIAC CATHETERIZATION     [facility]   CARDIOVERSION N/A 08/15/2018   Procedure: CARDIOVERSION;  Surgeon: Darron Deatrice LABOR, MD;  Location: [facility] ORS;  Service: Cardiovascular;  Laterality: N/A;   CARDIOVERSION N/A 11/14/2018   Procedure: CARDIOVERSION (CATH LAB);  Surgeon: Darron Deatrice LABOR, MD;  Location: [facility] ORS;  Service: Cardiovascular;  Laterality: N/A;   CARDIOVERSION N/A 06/05/2019   Procedure: CARDIOVERSION;  Surgeon: Darron Deatrice LABOR, MD;  Location: [facility] ORS;  Service: Cardiovascular;  Laterality: N/A;   CARDIOVERSION N/A 08/14/2019   Procedure: CARDIOVERSION;  Surgeon: Darron Deatrice LABOR, MD;  Location: [facility] ORS;  Service: Cardiovascular;  Laterality: N/A;   CARDIOVERSION N/A 11/09/2019   Procedure: CARDIOVERSION;  Surgeon: Ammon Blunt, MD;  Location: [facility] ORS;  Service: Cardiovascular;  Laterality: N/A;   CARDIOVERSION N/A 01/17/2020   Procedure: CARDIOVERSION;  Surgeon: Ammon Blunt, MD;  Location: [facility] ORS;  Service: Cardiovascular;  Laterality: N/A;   CARPAL TUNNEL RELEASE     ELECTROPHYSIOLOGIC STUDY N/A 08/17/2016   Procedure: Cardioversion;  Surgeon: Deatrice LABOR Darron, MD;  Location: [facility] ORS;  Service: Cardiovascular;  Laterality: N/A;   TUMOR EXCISION Left    x3 (arm)    Medical History: Past Medical History:  Diagnosis Date   Aortic 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 s/p bioprosthetic valve replacement 2008 at [facility];  b. 01/2015 Echo: EF 60-65%, no rwma, Gr1 DD, mildly dil LA, nl RV fxn.   Aspiration pneumonia ([name])    Atrial fibrillation with RVR ([name]) 01/11/2015   Atrial flutter ([name])    a. 08/2016 s/p DCCV.  Remains on flecainide  50 mg bid.   Basal skull fracture ([name]) 20 yrs ago   CHF (congestive heart failure) ([name])    Chronic respiratory failure ([name])    COPD (chronic obstructive pulmonary disease) ([name])    a. 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 2008 prior to Aortic aneurysm repair-->nl cors.   History of stress test    a. 10/2015 MV: no ischemia/infarct.   HLD (hyperlipidemia)    HTN (hypertension)    Hypothyroidism    Obesity    PAF (paroxysmal atrial fibrillation) ([name])    a. on Eliquis ; b. CHADS2VASc = 3 (HTN, age x 1, female).   Paroxysmal atrial fibrillation ([name]) 01/24/2015   Right upper quadrant abdominal tenderness without rebound tenderness 03/02/2018   S/P ascending aortic aneurysm repair 2008    Family History: Family History  Problem Relation Age of Onset   Stroke Father    Stroke Paternal Grandmother    Breast cancer Neg Hx     Social History   Socioeconomic History  Marital status: Divorced    Spouse name: Not on file   Number of children: Not on file   Years of education: Not on file   Highest education level: Not on file  Occupational History   Not on file  Tobacco Use   Smoking status: Former    Types: Cigarettes   Smokeless tobacco: Never  Vaping Use   Vaping status: Never Used  Substance and Sexual Activity   Alcohol use: No   Drug use: No   Sexual activity: Not Currently    Birth control/protection: Surgical  Other Topics Concern   Not on file  Social History Narrative   Not on file   Social Drivers of Health   Financial Resource Strain: Low Risk  (11/03/2022)   Received from [facility]   Overall Financial Resource Strain (CARDIA)    Difficulty of Paying Living Expenses: Not hard at all  Recent Concern: Financial Resource Strain - Medium Risk (11/02/2022)   Received from [facility]   Overall Financial Resource Strain (CARDIA)    Difficulty of Paying Living Expenses: Somewhat hard  Food Insecurity: Food Insecurity Present (04/02/2023)   Hunger Vital Sign    Worried About Running Out of Food in the Last Year: Sometimes true    Ran Out of Food in the Last Year: Sometimes true   Transportation Needs: No Transportation Needs (02/02/2023)   PRAPARE - Administrator, Civil Service (Medical): No    Lack of Transportation (Non-Medical): No  Physical Activity: Not on file  Stress: Not on file  Social Connections: Not on file  Intimate Partner Violence: Not At Risk (04/02/2023)   Humiliation, Afraid, Rape, and Kick questionnaire    Fear of Current or Ex-Partner: No    Emotionally Abused: No    Physically Abused: No    Sexually Abused: No      Review of Systems  Constitutional:  Negative for fatigue and fever.  HENT:  Negative for congestion, mouth sores and postnasal drip.   Respiratory:  Positive for shortness of breath. Negative for cough.   Cardiovascular:  Negative for chest pain.  Genitourinary:  Negative for flank pain.  Musculoskeletal:  Positive for arthralgias and gait problem.  Neurological:  Positive for weakness.  Psychiatric/Behavioral: Negative.      Vital Signs: BP (!) 118/51   Pulse 75   Temp 98.4 F (36.9 C)   Resp 16   Ht 4' 10 (1.473 m)   Wt 193 lb (87.5 kg)   SpO2 94%   BMI 40.34 kg/m    Physical Exam Constitutional:      Appearance: Normal appearance.  HENT:     Head: Normocephalic and atraumatic.     Nose: Nose normal.     Mouth/Throat:     Mouth: Mucous membranes are moist.     Pharynx: No posterior oropharyngeal erythema.  Eyes:     Extraocular Movements: Extraocular movements intact.     Pupils: Pupils are equal, round, and reactive to light.  Cardiovascular:     Pulses: Normal pulses.     Heart sounds: Normal heart sounds.  Pulmonary:     Effort: Respiratory distress present.     Comments: Decreased bs, on oxygen   Musculoskeletal:        General: Deformity present.  Neurological:     General: No focal deficit present.     Mental Status: She is alert.  Psychiatric:        Mood and Affect:  Mood normal.        Behavior: Behavior normal.        Assessment/Plan: 1. Chronic respiratory failure  with hypoxia ([name]) (Primary) Pt is to continue on her oxygen , continue non invasive ventilation at night  - Ambulatory referral to Home Health  2. Class 3 severe obesity due to excess calories with serious comorbidity and body mass index (BMI) of 40.0 to 44.9 in adult [facility]) This is due to lack of Physical activity. PT might help  - Ambulatory referral to Home Health  3. Poor tolerance for ambulation - Ambulatory referral to Home Health  4. Chronic pain syndrome Pt is t continue with her pain control therapy   5. Impaired mobility and ADLs Multifactorial as mentioned  - Ambulatory referral to Home Health  6. Right lower lobe pulmonary nodule, non small cell ( malignant neoplasm) Followed by oncology, CT chest every 6 months, s/p radiation, stable nodue   7. CHF (congestive heart failure), NYHA class IV, chronic, diastolic ([name]) Followed by cardiology  - Ambulatory referral to Home Health   General Counseling: Ave verbalizes understanding of the findings of todays visit and agrees with plan of treatment. I have discussed any further diagnostic evaluation that may be needed or ordered today. We also reviewed her medications today. she has been encouraged to call the office with any questions or concerns that should arise related to todays visit.    Orders Placed This Encounter  Procedures   Ambulatory referral to Home Health    No orders of the defined types were placed in this encounter.   Total time spent:45 Minutes Time spent includes review of chart, medications, test results, and follow up plan with the patient.   [place] Controlled Substance Database was reviewed by me.   Dr Valisa Karpel M [facility]

## 2024-06-14 ENCOUNTER — Telehealth: Payer: Self-pay

## 2024-06-14 NOTE — Telephone Encounter (Signed)
 Sent message for home health referral to [facility] they will take her referral

## 2024-06-16 DIAGNOSIS — Z604 Social exclusion and rejection: Secondary | ICD-10-CM | POA: Diagnosis not present

## 2024-06-16 DIAGNOSIS — Z950 Presence of cardiac pacemaker: Secondary | ICD-10-CM | POA: Diagnosis not present

## 2024-06-16 DIAGNOSIS — R911 Solitary pulmonary nodule: Secondary | ICD-10-CM | POA: Diagnosis not present

## 2024-06-16 DIAGNOSIS — I4892 Unspecified atrial flutter: Secondary | ICD-10-CM | POA: Diagnosis not present

## 2024-06-16 DIAGNOSIS — G894 Chronic pain syndrome: Secondary | ICD-10-CM | POA: Diagnosis not present

## 2024-06-16 DIAGNOSIS — J449 Chronic obstructive pulmonary disease, unspecified: Secondary | ICD-10-CM | POA: Diagnosis not present

## 2024-06-16 DIAGNOSIS — J9611 Chronic respiratory failure with hypoxia: Secondary | ICD-10-CM | POA: Diagnosis not present

## 2024-06-16 DIAGNOSIS — E785 Hyperlipidemia, unspecified: Secondary | ICD-10-CM | POA: Diagnosis not present

## 2024-06-16 DIAGNOSIS — Z87891 Personal history of nicotine dependence: Secondary | ICD-10-CM | POA: Diagnosis not present

## 2024-06-16 DIAGNOSIS — M25512 Pain in left shoulder: Secondary | ICD-10-CM | POA: Diagnosis not present

## 2024-06-16 DIAGNOSIS — Z7951 Long term (current) use of inhaled steroids: Secondary | ICD-10-CM | POA: Diagnosis not present

## 2024-06-16 DIAGNOSIS — Z7901 Long term (current) use of anticoagulants: Secondary | ICD-10-CM | POA: Diagnosis not present

## 2024-06-16 DIAGNOSIS — I480 Paroxysmal atrial fibrillation: Secondary | ICD-10-CM | POA: Diagnosis not present

## 2024-06-16 DIAGNOSIS — Z7984 Long term (current) use of oral hypoglycemic drugs: Secondary | ICD-10-CM | POA: Diagnosis not present

## 2024-06-16 DIAGNOSIS — I110 Hypertensive heart disease with heart failure: Secondary | ICD-10-CM | POA: Diagnosis not present

## 2024-06-16 DIAGNOSIS — Z6841 Body Mass Index (BMI) 40.0 and over, adult: Secondary | ICD-10-CM | POA: Diagnosis not present

## 2024-06-16 DIAGNOSIS — E039 Hypothyroidism, unspecified: Secondary | ICD-10-CM | POA: Diagnosis not present

## 2024-06-16 DIAGNOSIS — E66813 Obesity, class 3: Secondary | ICD-10-CM | POA: Diagnosis not present

## 2024-06-16 DIAGNOSIS — M25511 Pain in right shoulder: Secondary | ICD-10-CM | POA: Diagnosis not present

## 2024-06-16 DIAGNOSIS — I5032 Chronic diastolic (congestive) heart failure: Secondary | ICD-10-CM | POA: Diagnosis not present

## 2024-06-19 DIAGNOSIS — Z950 Presence of cardiac pacemaker: Secondary | ICD-10-CM | POA: Diagnosis not present

## 2024-06-19 DIAGNOSIS — I4892 Unspecified atrial flutter: Secondary | ICD-10-CM | POA: Diagnosis not present

## 2024-06-19 DIAGNOSIS — I5032 Chronic diastolic (congestive) heart failure: Secondary | ICD-10-CM | POA: Diagnosis not present

## 2024-06-19 DIAGNOSIS — G894 Chronic pain syndrome: Secondary | ICD-10-CM | POA: Diagnosis not present

## 2024-06-19 DIAGNOSIS — Z7951 Long term (current) use of inhaled steroids: Secondary | ICD-10-CM | POA: Diagnosis not present

## 2024-06-19 DIAGNOSIS — Z7901 Long term (current) use of anticoagulants: Secondary | ICD-10-CM | POA: Diagnosis not present

## 2024-06-19 DIAGNOSIS — Z604 Social exclusion and rejection: Secondary | ICD-10-CM | POA: Diagnosis not present

## 2024-06-19 DIAGNOSIS — Z87891 Personal history of nicotine dependence: Secondary | ICD-10-CM | POA: Diagnosis not present

## 2024-06-19 DIAGNOSIS — J9611 Chronic respiratory failure with hypoxia: Secondary | ICD-10-CM | POA: Diagnosis not present

## 2024-06-19 DIAGNOSIS — J449 Chronic obstructive pulmonary disease, unspecified: Secondary | ICD-10-CM | POA: Diagnosis not present

## 2024-06-19 DIAGNOSIS — Z6841 Body Mass Index (BMI) 40.0 and over, adult: Secondary | ICD-10-CM | POA: Diagnosis not present

## 2024-06-19 DIAGNOSIS — M25511 Pain in right shoulder: Secondary | ICD-10-CM | POA: Diagnosis not present

## 2024-06-19 DIAGNOSIS — I110 Hypertensive heart disease with heart failure: Secondary | ICD-10-CM | POA: Diagnosis not present

## 2024-06-19 DIAGNOSIS — E039 Hypothyroidism, unspecified: Secondary | ICD-10-CM | POA: Diagnosis not present

## 2024-06-19 DIAGNOSIS — E66813 Obesity, class 3: Secondary | ICD-10-CM | POA: Diagnosis not present

## 2024-06-19 DIAGNOSIS — Z7984 Long term (current) use of oral hypoglycemic drugs: Secondary | ICD-10-CM | POA: Diagnosis not present

## 2024-06-19 DIAGNOSIS — I480 Paroxysmal atrial fibrillation: Secondary | ICD-10-CM | POA: Diagnosis not present

## 2024-06-19 DIAGNOSIS — R911 Solitary pulmonary nodule: Secondary | ICD-10-CM | POA: Diagnosis not present

## 2024-06-19 DIAGNOSIS — E785 Hyperlipidemia, unspecified: Secondary | ICD-10-CM | POA: Diagnosis not present

## 2024-06-19 DIAGNOSIS — M25512 Pain in left shoulder: Secondary | ICD-10-CM | POA: Diagnosis not present

## 2024-06-20 ENCOUNTER — Ambulatory Visit: Admitting: Internal Medicine

## 2024-06-20 DIAGNOSIS — R911 Solitary pulmonary nodule: Secondary | ICD-10-CM | POA: Diagnosis not present

## 2024-06-20 DIAGNOSIS — E66813 Obesity, class 3: Secondary | ICD-10-CM | POA: Diagnosis not present

## 2024-06-20 DIAGNOSIS — Z87891 Personal history of nicotine dependence: Secondary | ICD-10-CM | POA: Diagnosis not present

## 2024-06-20 DIAGNOSIS — Z7951 Long term (current) use of inhaled steroids: Secondary | ICD-10-CM | POA: Diagnosis not present

## 2024-06-20 DIAGNOSIS — Z950 Presence of cardiac pacemaker: Secondary | ICD-10-CM | POA: Diagnosis not present

## 2024-06-20 DIAGNOSIS — Z604 Social exclusion and rejection: Secondary | ICD-10-CM | POA: Diagnosis not present

## 2024-06-20 DIAGNOSIS — Z7984 Long term (current) use of oral hypoglycemic drugs: Secondary | ICD-10-CM | POA: Diagnosis not present

## 2024-06-20 DIAGNOSIS — I5032 Chronic diastolic (congestive) heart failure: Secondary | ICD-10-CM | POA: Diagnosis not present

## 2024-06-20 DIAGNOSIS — J9611 Chronic respiratory failure with hypoxia: Secondary | ICD-10-CM | POA: Diagnosis not present

## 2024-06-20 DIAGNOSIS — M25511 Pain in right shoulder: Secondary | ICD-10-CM | POA: Diagnosis not present

## 2024-06-20 DIAGNOSIS — G894 Chronic pain syndrome: Secondary | ICD-10-CM | POA: Diagnosis not present

## 2024-06-20 DIAGNOSIS — E785 Hyperlipidemia, unspecified: Secondary | ICD-10-CM | POA: Diagnosis not present

## 2024-06-20 DIAGNOSIS — J449 Chronic obstructive pulmonary disease, unspecified: Secondary | ICD-10-CM | POA: Diagnosis not present

## 2024-06-20 DIAGNOSIS — I110 Hypertensive heart disease with heart failure: Secondary | ICD-10-CM | POA: Diagnosis not present

## 2024-06-20 DIAGNOSIS — I4892 Unspecified atrial flutter: Secondary | ICD-10-CM | POA: Diagnosis not present

## 2024-06-20 DIAGNOSIS — E039 Hypothyroidism, unspecified: Secondary | ICD-10-CM | POA: Diagnosis not present

## 2024-06-20 DIAGNOSIS — I480 Paroxysmal atrial fibrillation: Secondary | ICD-10-CM | POA: Diagnosis not present

## 2024-06-20 DIAGNOSIS — M25512 Pain in left shoulder: Secondary | ICD-10-CM | POA: Diagnosis not present

## 2024-06-20 DIAGNOSIS — Z6841 Body Mass Index (BMI) 40.0 and over, adult: Secondary | ICD-10-CM | POA: Diagnosis not present

## 2024-06-20 DIAGNOSIS — Z7901 Long term (current) use of anticoagulants: Secondary | ICD-10-CM | POA: Diagnosis not present

## 2024-06-21 ENCOUNTER — Encounter: Payer: Self-pay | Admitting: Nurse Practitioner

## 2024-06-21 DIAGNOSIS — K7460 Unspecified cirrhosis of liver: Secondary | ICD-10-CM | POA: Insufficient documentation

## 2024-06-21 DIAGNOSIS — K219 Gastro-esophageal reflux disease without esophagitis: Secondary | ICD-10-CM | POA: Insufficient documentation

## 2024-06-26 DIAGNOSIS — R911 Solitary pulmonary nodule: Secondary | ICD-10-CM | POA: Diagnosis not present

## 2024-06-26 DIAGNOSIS — Z604 Social exclusion and rejection: Secondary | ICD-10-CM | POA: Diagnosis not present

## 2024-06-26 DIAGNOSIS — M25512 Pain in left shoulder: Secondary | ICD-10-CM | POA: Diagnosis not present

## 2024-06-26 DIAGNOSIS — J449 Chronic obstructive pulmonary disease, unspecified: Secondary | ICD-10-CM | POA: Diagnosis not present

## 2024-06-26 DIAGNOSIS — I4892 Unspecified atrial flutter: Secondary | ICD-10-CM | POA: Diagnosis not present

## 2024-06-26 DIAGNOSIS — Z87891 Personal history of nicotine dependence: Secondary | ICD-10-CM | POA: Diagnosis not present

## 2024-06-26 DIAGNOSIS — Z950 Presence of cardiac pacemaker: Secondary | ICD-10-CM | POA: Diagnosis not present

## 2024-06-26 DIAGNOSIS — I110 Hypertensive heart disease with heart failure: Secondary | ICD-10-CM | POA: Diagnosis not present

## 2024-06-26 DIAGNOSIS — E66813 Obesity, class 3: Secondary | ICD-10-CM | POA: Diagnosis not present

## 2024-06-26 DIAGNOSIS — I480 Paroxysmal atrial fibrillation: Secondary | ICD-10-CM | POA: Diagnosis not present

## 2024-06-26 DIAGNOSIS — Z7984 Long term (current) use of oral hypoglycemic drugs: Secondary | ICD-10-CM | POA: Diagnosis not present

## 2024-06-26 DIAGNOSIS — E039 Hypothyroidism, unspecified: Secondary | ICD-10-CM | POA: Diagnosis not present

## 2024-06-26 DIAGNOSIS — Z7951 Long term (current) use of inhaled steroids: Secondary | ICD-10-CM | POA: Diagnosis not present

## 2024-06-26 DIAGNOSIS — G894 Chronic pain syndrome: Secondary | ICD-10-CM | POA: Diagnosis not present

## 2024-06-26 DIAGNOSIS — Z7901 Long term (current) use of anticoagulants: Secondary | ICD-10-CM | POA: Diagnosis not present

## 2024-06-26 DIAGNOSIS — Z6841 Body Mass Index (BMI) 40.0 and over, adult: Secondary | ICD-10-CM | POA: Diagnosis not present

## 2024-06-26 DIAGNOSIS — E785 Hyperlipidemia, unspecified: Secondary | ICD-10-CM | POA: Diagnosis not present

## 2024-06-26 DIAGNOSIS — M25511 Pain in right shoulder: Secondary | ICD-10-CM | POA: Diagnosis not present

## 2024-06-26 DIAGNOSIS — I5032 Chronic diastolic (congestive) heart failure: Secondary | ICD-10-CM | POA: Diagnosis not present

## 2024-06-26 DIAGNOSIS — J9611 Chronic respiratory failure with hypoxia: Secondary | ICD-10-CM | POA: Diagnosis not present

## 2024-06-27 ENCOUNTER — Ambulatory Visit: Admitting: Internal Medicine

## 2024-06-28 DIAGNOSIS — Z7984 Long term (current) use of oral hypoglycemic drugs: Secondary | ICD-10-CM | POA: Diagnosis not present

## 2024-06-28 DIAGNOSIS — E785 Hyperlipidemia, unspecified: Secondary | ICD-10-CM | POA: Diagnosis not present

## 2024-06-28 DIAGNOSIS — I4892 Unspecified atrial flutter: Secondary | ICD-10-CM | POA: Diagnosis not present

## 2024-06-28 DIAGNOSIS — R911 Solitary pulmonary nodule: Secondary | ICD-10-CM | POA: Diagnosis not present

## 2024-06-28 DIAGNOSIS — Z87891 Personal history of nicotine dependence: Secondary | ICD-10-CM | POA: Diagnosis not present

## 2024-06-28 DIAGNOSIS — E66813 Obesity, class 3: Secondary | ICD-10-CM | POA: Diagnosis not present

## 2024-06-28 DIAGNOSIS — Z7901 Long term (current) use of anticoagulants: Secondary | ICD-10-CM | POA: Diagnosis not present

## 2024-06-28 DIAGNOSIS — M25511 Pain in right shoulder: Secondary | ICD-10-CM | POA: Diagnosis not present

## 2024-06-28 DIAGNOSIS — Z7951 Long term (current) use of inhaled steroids: Secondary | ICD-10-CM | POA: Diagnosis not present

## 2024-06-28 DIAGNOSIS — J9611 Chronic respiratory failure with hypoxia: Secondary | ICD-10-CM | POA: Diagnosis not present

## 2024-06-28 DIAGNOSIS — M25512 Pain in left shoulder: Secondary | ICD-10-CM | POA: Diagnosis not present

## 2024-06-28 DIAGNOSIS — Z6841 Body Mass Index (BMI) 40.0 and over, adult: Secondary | ICD-10-CM | POA: Diagnosis not present

## 2024-06-28 DIAGNOSIS — I5032 Chronic diastolic (congestive) heart failure: Secondary | ICD-10-CM | POA: Diagnosis not present

## 2024-06-28 DIAGNOSIS — J449 Chronic obstructive pulmonary disease, unspecified: Secondary | ICD-10-CM | POA: Diagnosis not present

## 2024-06-28 DIAGNOSIS — Z604 Social exclusion and rejection: Secondary | ICD-10-CM | POA: Diagnosis not present

## 2024-06-28 DIAGNOSIS — I110 Hypertensive heart disease with heart failure: Secondary | ICD-10-CM | POA: Diagnosis not present

## 2024-06-28 DIAGNOSIS — E039 Hypothyroidism, unspecified: Secondary | ICD-10-CM | POA: Diagnosis not present

## 2024-06-28 DIAGNOSIS — G894 Chronic pain syndrome: Secondary | ICD-10-CM | POA: Diagnosis not present

## 2024-06-28 DIAGNOSIS — Z950 Presence of cardiac pacemaker: Secondary | ICD-10-CM | POA: Diagnosis not present

## 2024-06-28 DIAGNOSIS — I480 Paroxysmal atrial fibrillation: Secondary | ICD-10-CM | POA: Diagnosis not present

## 2024-06-29 DIAGNOSIS — I4892 Unspecified atrial flutter: Secondary | ICD-10-CM | POA: Diagnosis not present

## 2024-06-29 DIAGNOSIS — I110 Hypertensive heart disease with heart failure: Secondary | ICD-10-CM | POA: Diagnosis not present

## 2024-06-29 DIAGNOSIS — M25511 Pain in right shoulder: Secondary | ICD-10-CM | POA: Diagnosis not present

## 2024-06-29 DIAGNOSIS — I480 Paroxysmal atrial fibrillation: Secondary | ICD-10-CM | POA: Diagnosis not present

## 2024-06-29 DIAGNOSIS — E66813 Obesity, class 3: Secondary | ICD-10-CM | POA: Diagnosis not present

## 2024-06-29 DIAGNOSIS — Z7984 Long term (current) use of oral hypoglycemic drugs: Secondary | ICD-10-CM | POA: Diagnosis not present

## 2024-06-29 DIAGNOSIS — G894 Chronic pain syndrome: Secondary | ICD-10-CM | POA: Diagnosis not present

## 2024-06-29 DIAGNOSIS — E039 Hypothyroidism, unspecified: Secondary | ICD-10-CM | POA: Diagnosis not present

## 2024-06-29 DIAGNOSIS — R911 Solitary pulmonary nodule: Secondary | ICD-10-CM | POA: Diagnosis not present

## 2024-06-29 DIAGNOSIS — Z7951 Long term (current) use of inhaled steroids: Secondary | ICD-10-CM | POA: Diagnosis not present

## 2024-06-29 DIAGNOSIS — Z6841 Body Mass Index (BMI) 40.0 and over, adult: Secondary | ICD-10-CM | POA: Diagnosis not present

## 2024-06-29 DIAGNOSIS — Z7901 Long term (current) use of anticoagulants: Secondary | ICD-10-CM | POA: Diagnosis not present

## 2024-06-29 DIAGNOSIS — Z950 Presence of cardiac pacemaker: Secondary | ICD-10-CM | POA: Diagnosis not present

## 2024-06-29 DIAGNOSIS — Z87891 Personal history of nicotine dependence: Secondary | ICD-10-CM | POA: Diagnosis not present

## 2024-06-29 DIAGNOSIS — I5032 Chronic diastolic (congestive) heart failure: Secondary | ICD-10-CM | POA: Diagnosis not present

## 2024-06-29 DIAGNOSIS — E785 Hyperlipidemia, unspecified: Secondary | ICD-10-CM | POA: Diagnosis not present

## 2024-06-29 DIAGNOSIS — Z604 Social exclusion and rejection: Secondary | ICD-10-CM | POA: Diagnosis not present

## 2024-06-29 DIAGNOSIS — J449 Chronic obstructive pulmonary disease, unspecified: Secondary | ICD-10-CM | POA: Diagnosis not present

## 2024-06-29 DIAGNOSIS — J9611 Chronic respiratory failure with hypoxia: Secondary | ICD-10-CM | POA: Diagnosis not present

## 2024-06-29 DIAGNOSIS — M25512 Pain in left shoulder: Secondary | ICD-10-CM | POA: Diagnosis not present

## 2024-06-30 ENCOUNTER — Other Ambulatory Visit: Payer: Self-pay | Admitting: Family

## 2024-06-30 DIAGNOSIS — Z7901 Long term (current) use of anticoagulants: Secondary | ICD-10-CM | POA: Diagnosis not present

## 2024-06-30 DIAGNOSIS — I5032 Chronic diastolic (congestive) heart failure: Secondary | ICD-10-CM | POA: Diagnosis not present

## 2024-06-30 DIAGNOSIS — Z950 Presence of cardiac pacemaker: Secondary | ICD-10-CM | POA: Diagnosis not present

## 2024-06-30 DIAGNOSIS — I110 Hypertensive heart disease with heart failure: Secondary | ICD-10-CM | POA: Diagnosis not present

## 2024-06-30 DIAGNOSIS — E039 Hypothyroidism, unspecified: Secondary | ICD-10-CM | POA: Diagnosis not present

## 2024-06-30 DIAGNOSIS — R911 Solitary pulmonary nodule: Secondary | ICD-10-CM | POA: Diagnosis not present

## 2024-06-30 DIAGNOSIS — E66813 Obesity, class 3: Secondary | ICD-10-CM | POA: Diagnosis not present

## 2024-06-30 DIAGNOSIS — I4892 Unspecified atrial flutter: Secondary | ICD-10-CM | POA: Diagnosis not present

## 2024-06-30 DIAGNOSIS — E785 Hyperlipidemia, unspecified: Secondary | ICD-10-CM | POA: Diagnosis not present

## 2024-06-30 DIAGNOSIS — M25512 Pain in left shoulder: Secondary | ICD-10-CM | POA: Diagnosis not present

## 2024-06-30 DIAGNOSIS — Z6841 Body Mass Index (BMI) 40.0 and over, adult: Secondary | ICD-10-CM | POA: Diagnosis not present

## 2024-06-30 DIAGNOSIS — J449 Chronic obstructive pulmonary disease, unspecified: Secondary | ICD-10-CM | POA: Diagnosis not present

## 2024-06-30 DIAGNOSIS — I480 Paroxysmal atrial fibrillation: Secondary | ICD-10-CM | POA: Diagnosis not present

## 2024-06-30 DIAGNOSIS — G894 Chronic pain syndrome: Secondary | ICD-10-CM | POA: Diagnosis not present

## 2024-06-30 DIAGNOSIS — J9611 Chronic respiratory failure with hypoxia: Secondary | ICD-10-CM | POA: Diagnosis not present

## 2024-06-30 DIAGNOSIS — Z87891 Personal history of nicotine dependence: Secondary | ICD-10-CM | POA: Diagnosis not present

## 2024-06-30 DIAGNOSIS — Z7951 Long term (current) use of inhaled steroids: Secondary | ICD-10-CM | POA: Diagnosis not present

## 2024-06-30 DIAGNOSIS — Z7984 Long term (current) use of oral hypoglycemic drugs: Secondary | ICD-10-CM | POA: Diagnosis not present

## 2024-06-30 DIAGNOSIS — Z604 Social exclusion and rejection: Secondary | ICD-10-CM | POA: Diagnosis not present

## 2024-06-30 DIAGNOSIS — M25511 Pain in right shoulder: Secondary | ICD-10-CM | POA: Diagnosis not present

## 2024-07-02 DIAGNOSIS — G4733 Obstructive sleep apnea (adult) (pediatric): Secondary | ICD-10-CM | POA: Diagnosis not present

## 2024-07-03 DIAGNOSIS — R911 Solitary pulmonary nodule: Secondary | ICD-10-CM | POA: Diagnosis not present

## 2024-07-03 DIAGNOSIS — Z7984 Long term (current) use of oral hypoglycemic drugs: Secondary | ICD-10-CM | POA: Diagnosis not present

## 2024-07-03 DIAGNOSIS — G894 Chronic pain syndrome: Secondary | ICD-10-CM | POA: Diagnosis not present

## 2024-07-03 DIAGNOSIS — Z604 Social exclusion and rejection: Secondary | ICD-10-CM | POA: Diagnosis not present

## 2024-07-03 DIAGNOSIS — Z7951 Long term (current) use of inhaled steroids: Secondary | ICD-10-CM | POA: Diagnosis not present

## 2024-07-03 DIAGNOSIS — Z950 Presence of cardiac pacemaker: Secondary | ICD-10-CM | POA: Diagnosis not present

## 2024-07-03 DIAGNOSIS — M25511 Pain in right shoulder: Secondary | ICD-10-CM | POA: Diagnosis not present

## 2024-07-03 DIAGNOSIS — Z7901 Long term (current) use of anticoagulants: Secondary | ICD-10-CM | POA: Diagnosis not present

## 2024-07-03 DIAGNOSIS — J449 Chronic obstructive pulmonary disease, unspecified: Secondary | ICD-10-CM | POA: Diagnosis not present

## 2024-07-03 DIAGNOSIS — E66813 Obesity, class 3: Secondary | ICD-10-CM | POA: Diagnosis not present

## 2024-07-03 DIAGNOSIS — Z6841 Body Mass Index (BMI) 40.0 and over, adult: Secondary | ICD-10-CM | POA: Diagnosis not present

## 2024-07-03 DIAGNOSIS — E039 Hypothyroidism, unspecified: Secondary | ICD-10-CM | POA: Diagnosis not present

## 2024-07-03 DIAGNOSIS — I480 Paroxysmal atrial fibrillation: Secondary | ICD-10-CM | POA: Diagnosis not present

## 2024-07-03 DIAGNOSIS — I4892 Unspecified atrial flutter: Secondary | ICD-10-CM | POA: Diagnosis not present

## 2024-07-03 DIAGNOSIS — J9611 Chronic respiratory failure with hypoxia: Secondary | ICD-10-CM | POA: Diagnosis not present

## 2024-07-03 DIAGNOSIS — E785 Hyperlipidemia, unspecified: Secondary | ICD-10-CM | POA: Diagnosis not present

## 2024-07-03 DIAGNOSIS — I110 Hypertensive heart disease with heart failure: Secondary | ICD-10-CM | POA: Diagnosis not present

## 2024-07-03 DIAGNOSIS — M25512 Pain in left shoulder: Secondary | ICD-10-CM | POA: Diagnosis not present

## 2024-07-03 DIAGNOSIS — I5032 Chronic diastolic (congestive) heart failure: Secondary | ICD-10-CM | POA: Diagnosis not present

## 2024-07-03 DIAGNOSIS — Z87891 Personal history of nicotine dependence: Secondary | ICD-10-CM | POA: Diagnosis not present

## 2024-07-04 ENCOUNTER — Ambulatory Visit: Admitting: Nurse Practitioner

## 2024-07-05 DIAGNOSIS — E66813 Obesity, class 3: Secondary | ICD-10-CM | POA: Diagnosis not present

## 2024-07-05 DIAGNOSIS — I4892 Unspecified atrial flutter: Secondary | ICD-10-CM | POA: Diagnosis not present

## 2024-07-05 DIAGNOSIS — Z7984 Long term (current) use of oral hypoglycemic drugs: Secondary | ICD-10-CM | POA: Diagnosis not present

## 2024-07-05 DIAGNOSIS — J449 Chronic obstructive pulmonary disease, unspecified: Secondary | ICD-10-CM | POA: Diagnosis not present

## 2024-07-05 DIAGNOSIS — Z950 Presence of cardiac pacemaker: Secondary | ICD-10-CM | POA: Diagnosis not present

## 2024-07-05 DIAGNOSIS — Z7901 Long term (current) use of anticoagulants: Secondary | ICD-10-CM | POA: Diagnosis not present

## 2024-07-05 DIAGNOSIS — I480 Paroxysmal atrial fibrillation: Secondary | ICD-10-CM | POA: Diagnosis not present

## 2024-07-05 DIAGNOSIS — I110 Hypertensive heart disease with heart failure: Secondary | ICD-10-CM | POA: Diagnosis not present

## 2024-07-05 DIAGNOSIS — Z87891 Personal history of nicotine dependence: Secondary | ICD-10-CM | POA: Diagnosis not present

## 2024-07-05 DIAGNOSIS — M25511 Pain in right shoulder: Secondary | ICD-10-CM | POA: Diagnosis not present

## 2024-07-05 DIAGNOSIS — I5032 Chronic diastolic (congestive) heart failure: Secondary | ICD-10-CM | POA: Diagnosis not present

## 2024-07-05 DIAGNOSIS — M25512 Pain in left shoulder: Secondary | ICD-10-CM | POA: Diagnosis not present

## 2024-07-05 DIAGNOSIS — J9611 Chronic respiratory failure with hypoxia: Secondary | ICD-10-CM | POA: Diagnosis not present

## 2024-07-05 DIAGNOSIS — R911 Solitary pulmonary nodule: Secondary | ICD-10-CM | POA: Diagnosis not present

## 2024-07-05 DIAGNOSIS — G894 Chronic pain syndrome: Secondary | ICD-10-CM | POA: Diagnosis not present

## 2024-07-05 DIAGNOSIS — Z6841 Body Mass Index (BMI) 40.0 and over, adult: Secondary | ICD-10-CM | POA: Diagnosis not present

## 2024-07-05 DIAGNOSIS — Z7951 Long term (current) use of inhaled steroids: Secondary | ICD-10-CM | POA: Diagnosis not present

## 2024-07-05 DIAGNOSIS — Z604 Social exclusion and rejection: Secondary | ICD-10-CM | POA: Diagnosis not present

## 2024-07-05 DIAGNOSIS — E785 Hyperlipidemia, unspecified: Secondary | ICD-10-CM | POA: Diagnosis not present

## 2024-07-05 DIAGNOSIS — E039 Hypothyroidism, unspecified: Secondary | ICD-10-CM | POA: Diagnosis not present

## 2024-07-06 DIAGNOSIS — Z604 Social exclusion and rejection: Secondary | ICD-10-CM | POA: Diagnosis not present

## 2024-07-06 DIAGNOSIS — I4892 Unspecified atrial flutter: Secondary | ICD-10-CM | POA: Diagnosis not present

## 2024-07-06 DIAGNOSIS — Z950 Presence of cardiac pacemaker: Secondary | ICD-10-CM | POA: Diagnosis not present

## 2024-07-06 DIAGNOSIS — I480 Paroxysmal atrial fibrillation: Secondary | ICD-10-CM | POA: Diagnosis not present

## 2024-07-06 DIAGNOSIS — I5032 Chronic diastolic (congestive) heart failure: Secondary | ICD-10-CM | POA: Diagnosis not present

## 2024-07-06 DIAGNOSIS — I110 Hypertensive heart disease with heart failure: Secondary | ICD-10-CM | POA: Diagnosis not present

## 2024-07-06 DIAGNOSIS — E785 Hyperlipidemia, unspecified: Secondary | ICD-10-CM | POA: Diagnosis not present

## 2024-07-06 DIAGNOSIS — Z6841 Body Mass Index (BMI) 40.0 and over, adult: Secondary | ICD-10-CM | POA: Diagnosis not present

## 2024-07-06 DIAGNOSIS — Z7901 Long term (current) use of anticoagulants: Secondary | ICD-10-CM | POA: Diagnosis not present

## 2024-07-06 DIAGNOSIS — Z7951 Long term (current) use of inhaled steroids: Secondary | ICD-10-CM | POA: Diagnosis not present

## 2024-07-06 DIAGNOSIS — G894 Chronic pain syndrome: Secondary | ICD-10-CM | POA: Diagnosis not present

## 2024-07-06 DIAGNOSIS — M25511 Pain in right shoulder: Secondary | ICD-10-CM | POA: Diagnosis not present

## 2024-07-06 DIAGNOSIS — Z7984 Long term (current) use of oral hypoglycemic drugs: Secondary | ICD-10-CM | POA: Diagnosis not present

## 2024-07-06 DIAGNOSIS — Z87891 Personal history of nicotine dependence: Secondary | ICD-10-CM | POA: Diagnosis not present

## 2024-07-06 DIAGNOSIS — J449 Chronic obstructive pulmonary disease, unspecified: Secondary | ICD-10-CM | POA: Diagnosis not present

## 2024-07-06 DIAGNOSIS — J9611 Chronic respiratory failure with hypoxia: Secondary | ICD-10-CM | POA: Diagnosis not present

## 2024-07-06 DIAGNOSIS — E039 Hypothyroidism, unspecified: Secondary | ICD-10-CM | POA: Diagnosis not present

## 2024-07-06 DIAGNOSIS — E66813 Obesity, class 3: Secondary | ICD-10-CM | POA: Diagnosis not present

## 2024-07-06 DIAGNOSIS — M25512 Pain in left shoulder: Secondary | ICD-10-CM | POA: Diagnosis not present

## 2024-07-06 DIAGNOSIS — R911 Solitary pulmonary nodule: Secondary | ICD-10-CM | POA: Diagnosis not present

## 2024-07-07 DIAGNOSIS — Z7984 Long term (current) use of oral hypoglycemic drugs: Secondary | ICD-10-CM | POA: Diagnosis not present

## 2024-07-07 DIAGNOSIS — Z604 Social exclusion and rejection: Secondary | ICD-10-CM | POA: Diagnosis not present

## 2024-07-07 DIAGNOSIS — E785 Hyperlipidemia, unspecified: Secondary | ICD-10-CM | POA: Diagnosis not present

## 2024-07-07 DIAGNOSIS — E039 Hypothyroidism, unspecified: Secondary | ICD-10-CM | POA: Diagnosis not present

## 2024-07-07 DIAGNOSIS — Z950 Presence of cardiac pacemaker: Secondary | ICD-10-CM | POA: Diagnosis not present

## 2024-07-07 DIAGNOSIS — M25511 Pain in right shoulder: Secondary | ICD-10-CM | POA: Diagnosis not present

## 2024-07-07 DIAGNOSIS — J9611 Chronic respiratory failure with hypoxia: Secondary | ICD-10-CM | POA: Diagnosis not present

## 2024-07-07 DIAGNOSIS — M25512 Pain in left shoulder: Secondary | ICD-10-CM | POA: Diagnosis not present

## 2024-07-07 DIAGNOSIS — J449 Chronic obstructive pulmonary disease, unspecified: Secondary | ICD-10-CM | POA: Diagnosis not present

## 2024-07-07 DIAGNOSIS — Z6841 Body Mass Index (BMI) 40.0 and over, adult: Secondary | ICD-10-CM | POA: Diagnosis not present

## 2024-07-07 DIAGNOSIS — I5032 Chronic diastolic (congestive) heart failure: Secondary | ICD-10-CM | POA: Diagnosis not present

## 2024-07-07 DIAGNOSIS — I4892 Unspecified atrial flutter: Secondary | ICD-10-CM | POA: Diagnosis not present

## 2024-07-07 DIAGNOSIS — E66813 Obesity, class 3: Secondary | ICD-10-CM | POA: Diagnosis not present

## 2024-07-07 DIAGNOSIS — Z7901 Long term (current) use of anticoagulants: Secondary | ICD-10-CM | POA: Diagnosis not present

## 2024-07-07 DIAGNOSIS — G894 Chronic pain syndrome: Secondary | ICD-10-CM | POA: Diagnosis not present

## 2024-07-07 DIAGNOSIS — I480 Paroxysmal atrial fibrillation: Secondary | ICD-10-CM | POA: Diagnosis not present

## 2024-07-07 DIAGNOSIS — I110 Hypertensive heart disease with heart failure: Secondary | ICD-10-CM | POA: Diagnosis not present

## 2024-07-07 DIAGNOSIS — Z7951 Long term (current) use of inhaled steroids: Secondary | ICD-10-CM | POA: Diagnosis not present

## 2024-07-07 DIAGNOSIS — Z87891 Personal history of nicotine dependence: Secondary | ICD-10-CM | POA: Diagnosis not present

## 2024-07-07 DIAGNOSIS — R911 Solitary pulmonary nodule: Secondary | ICD-10-CM | POA: Diagnosis not present

## 2024-07-12 DIAGNOSIS — I480 Paroxysmal atrial fibrillation: Secondary | ICD-10-CM | POA: Diagnosis not present

## 2024-07-12 DIAGNOSIS — Z6841 Body Mass Index (BMI) 40.0 and over, adult: Secondary | ICD-10-CM | POA: Diagnosis not present

## 2024-07-12 DIAGNOSIS — Z87891 Personal history of nicotine dependence: Secondary | ICD-10-CM | POA: Diagnosis not present

## 2024-07-12 DIAGNOSIS — E039 Hypothyroidism, unspecified: Secondary | ICD-10-CM | POA: Diagnosis not present

## 2024-07-12 DIAGNOSIS — Z7951 Long term (current) use of inhaled steroids: Secondary | ICD-10-CM | POA: Diagnosis not present

## 2024-07-12 DIAGNOSIS — M25511 Pain in right shoulder: Secondary | ICD-10-CM | POA: Diagnosis not present

## 2024-07-12 DIAGNOSIS — Z950 Presence of cardiac pacemaker: Secondary | ICD-10-CM | POA: Diagnosis not present

## 2024-07-12 DIAGNOSIS — E785 Hyperlipidemia, unspecified: Secondary | ICD-10-CM | POA: Diagnosis not present

## 2024-07-12 DIAGNOSIS — J9611 Chronic respiratory failure with hypoxia: Secondary | ICD-10-CM | POA: Diagnosis not present

## 2024-07-12 DIAGNOSIS — E66813 Obesity, class 3: Secondary | ICD-10-CM | POA: Diagnosis not present

## 2024-07-12 DIAGNOSIS — Z7901 Long term (current) use of anticoagulants: Secondary | ICD-10-CM | POA: Diagnosis not present

## 2024-07-12 DIAGNOSIS — I5032 Chronic diastolic (congestive) heart failure: Secondary | ICD-10-CM | POA: Diagnosis not present

## 2024-07-12 DIAGNOSIS — I110 Hypertensive heart disease with heart failure: Secondary | ICD-10-CM | POA: Diagnosis not present

## 2024-07-12 DIAGNOSIS — I4892 Unspecified atrial flutter: Secondary | ICD-10-CM | POA: Diagnosis not present

## 2024-07-12 DIAGNOSIS — Z7984 Long term (current) use of oral hypoglycemic drugs: Secondary | ICD-10-CM | POA: Diagnosis not present

## 2024-07-12 DIAGNOSIS — R911 Solitary pulmonary nodule: Secondary | ICD-10-CM | POA: Diagnosis not present

## 2024-07-12 DIAGNOSIS — G894 Chronic pain syndrome: Secondary | ICD-10-CM | POA: Diagnosis not present

## 2024-07-12 DIAGNOSIS — Z604 Social exclusion and rejection: Secondary | ICD-10-CM | POA: Diagnosis not present

## 2024-07-12 DIAGNOSIS — M25512 Pain in left shoulder: Secondary | ICD-10-CM | POA: Diagnosis not present

## 2024-07-12 DIAGNOSIS — J449 Chronic obstructive pulmonary disease, unspecified: Secondary | ICD-10-CM | POA: Diagnosis not present

## 2024-07-13 ENCOUNTER — Other Ambulatory Visit: Payer: Self-pay | Admitting: Nurse Practitioner

## 2024-07-14 DIAGNOSIS — H2513 Age-related nuclear cataract, bilateral: Secondary | ICD-10-CM | POA: Diagnosis not present

## 2024-07-14 DIAGNOSIS — H25012 Cortical age-related cataract, left eye: Secondary | ICD-10-CM | POA: Diagnosis not present

## 2024-07-14 DIAGNOSIS — H04123 Dry eye syndrome of bilateral lacrimal glands: Secondary | ICD-10-CM | POA: Diagnosis not present

## 2024-07-16 DIAGNOSIS — E785 Hyperlipidemia, unspecified: Secondary | ICD-10-CM | POA: Diagnosis not present

## 2024-07-16 DIAGNOSIS — R911 Solitary pulmonary nodule: Secondary | ICD-10-CM | POA: Diagnosis not present

## 2024-07-16 DIAGNOSIS — Z7951 Long term (current) use of inhaled steroids: Secondary | ICD-10-CM | POA: Diagnosis not present

## 2024-07-16 DIAGNOSIS — M25511 Pain in right shoulder: Secondary | ICD-10-CM | POA: Diagnosis not present

## 2024-07-16 DIAGNOSIS — I4892 Unspecified atrial flutter: Secondary | ICD-10-CM | POA: Diagnosis not present

## 2024-07-16 DIAGNOSIS — G894 Chronic pain syndrome: Secondary | ICD-10-CM | POA: Diagnosis not present

## 2024-07-16 DIAGNOSIS — Z950 Presence of cardiac pacemaker: Secondary | ICD-10-CM | POA: Diagnosis not present

## 2024-07-16 DIAGNOSIS — Z6841 Body Mass Index (BMI) 40.0 and over, adult: Secondary | ICD-10-CM | POA: Diagnosis not present

## 2024-07-16 DIAGNOSIS — E039 Hypothyroidism, unspecified: Secondary | ICD-10-CM | POA: Diagnosis not present

## 2024-07-16 DIAGNOSIS — I480 Paroxysmal atrial fibrillation: Secondary | ICD-10-CM | POA: Diagnosis not present

## 2024-07-16 DIAGNOSIS — M25512 Pain in left shoulder: Secondary | ICD-10-CM | POA: Diagnosis not present

## 2024-07-16 DIAGNOSIS — Z7984 Long term (current) use of oral hypoglycemic drugs: Secondary | ICD-10-CM | POA: Diagnosis not present

## 2024-07-16 DIAGNOSIS — J449 Chronic obstructive pulmonary disease, unspecified: Secondary | ICD-10-CM | POA: Diagnosis not present

## 2024-07-16 DIAGNOSIS — Z604 Social exclusion and rejection: Secondary | ICD-10-CM | POA: Diagnosis not present

## 2024-07-16 DIAGNOSIS — Z87891 Personal history of nicotine dependence: Secondary | ICD-10-CM | POA: Diagnosis not present

## 2024-07-16 DIAGNOSIS — I110 Hypertensive heart disease with heart failure: Secondary | ICD-10-CM | POA: Diagnosis not present

## 2024-07-16 DIAGNOSIS — Z7901 Long term (current) use of anticoagulants: Secondary | ICD-10-CM | POA: Diagnosis not present

## 2024-07-16 DIAGNOSIS — J9611 Chronic respiratory failure with hypoxia: Secondary | ICD-10-CM | POA: Diagnosis not present

## 2024-07-16 DIAGNOSIS — E66813 Obesity, class 3: Secondary | ICD-10-CM | POA: Diagnosis not present

## 2024-07-16 DIAGNOSIS — I5032 Chronic diastolic (congestive) heart failure: Secondary | ICD-10-CM | POA: Diagnosis not present

## 2024-07-20 DIAGNOSIS — I110 Hypertensive heart disease with heart failure: Secondary | ICD-10-CM | POA: Diagnosis not present

## 2024-07-20 DIAGNOSIS — E66813 Obesity, class 3: Secondary | ICD-10-CM | POA: Diagnosis not present

## 2024-07-20 DIAGNOSIS — M25512 Pain in left shoulder: Secondary | ICD-10-CM | POA: Diagnosis not present

## 2024-07-20 DIAGNOSIS — Z7901 Long term (current) use of anticoagulants: Secondary | ICD-10-CM | POA: Diagnosis not present

## 2024-07-20 DIAGNOSIS — M25511 Pain in right shoulder: Secondary | ICD-10-CM | POA: Diagnosis not present

## 2024-07-20 DIAGNOSIS — Z7951 Long term (current) use of inhaled steroids: Secondary | ICD-10-CM | POA: Diagnosis not present

## 2024-07-20 DIAGNOSIS — I5032 Chronic diastolic (congestive) heart failure: Secondary | ICD-10-CM | POA: Diagnosis not present

## 2024-07-20 DIAGNOSIS — Z604 Social exclusion and rejection: Secondary | ICD-10-CM | POA: Diagnosis not present

## 2024-07-20 DIAGNOSIS — Z6841 Body Mass Index (BMI) 40.0 and over, adult: Secondary | ICD-10-CM | POA: Diagnosis not present

## 2024-07-20 DIAGNOSIS — J9611 Chronic respiratory failure with hypoxia: Secondary | ICD-10-CM | POA: Diagnosis not present

## 2024-07-20 DIAGNOSIS — J449 Chronic obstructive pulmonary disease, unspecified: Secondary | ICD-10-CM | POA: Diagnosis not present

## 2024-07-20 DIAGNOSIS — I4892 Unspecified atrial flutter: Secondary | ICD-10-CM | POA: Diagnosis not present

## 2024-07-20 DIAGNOSIS — Z7984 Long term (current) use of oral hypoglycemic drugs: Secondary | ICD-10-CM | POA: Diagnosis not present

## 2024-07-20 DIAGNOSIS — E039 Hypothyroidism, unspecified: Secondary | ICD-10-CM | POA: Diagnosis not present

## 2024-07-20 DIAGNOSIS — I480 Paroxysmal atrial fibrillation: Secondary | ICD-10-CM | POA: Diagnosis not present

## 2024-07-20 DIAGNOSIS — Z87891 Personal history of nicotine dependence: Secondary | ICD-10-CM | POA: Diagnosis not present

## 2024-07-20 DIAGNOSIS — R911 Solitary pulmonary nodule: Secondary | ICD-10-CM | POA: Diagnosis not present

## 2024-07-20 DIAGNOSIS — G894 Chronic pain syndrome: Secondary | ICD-10-CM | POA: Diagnosis not present

## 2024-07-20 DIAGNOSIS — Z950 Presence of cardiac pacemaker: Secondary | ICD-10-CM | POA: Diagnosis not present

## 2024-07-20 DIAGNOSIS — E785 Hyperlipidemia, unspecified: Secondary | ICD-10-CM | POA: Diagnosis not present

## 2024-07-24 DIAGNOSIS — Z950 Presence of cardiac pacemaker: Secondary | ICD-10-CM | POA: Diagnosis not present

## 2024-07-24 DIAGNOSIS — I4891 Unspecified atrial fibrillation: Secondary | ICD-10-CM | POA: Diagnosis not present

## 2024-07-24 DIAGNOSIS — Q2381 Bicuspid aortic valve: Secondary | ICD-10-CM | POA: Diagnosis not present

## 2024-07-24 DIAGNOSIS — Z953 Presence of xenogenic heart valve: Secondary | ICD-10-CM | POA: Diagnosis not present

## 2024-07-25 DIAGNOSIS — E785 Hyperlipidemia, unspecified: Secondary | ICD-10-CM | POA: Diagnosis not present

## 2024-07-25 DIAGNOSIS — Z87891 Personal history of nicotine dependence: Secondary | ICD-10-CM | POA: Diagnosis not present

## 2024-07-25 DIAGNOSIS — M25512 Pain in left shoulder: Secondary | ICD-10-CM | POA: Diagnosis not present

## 2024-07-25 DIAGNOSIS — I5032 Chronic diastolic (congestive) heart failure: Secondary | ICD-10-CM | POA: Diagnosis not present

## 2024-07-25 DIAGNOSIS — Z7984 Long term (current) use of oral hypoglycemic drugs: Secondary | ICD-10-CM | POA: Diagnosis not present

## 2024-07-25 DIAGNOSIS — I480 Paroxysmal atrial fibrillation: Secondary | ICD-10-CM | POA: Diagnosis not present

## 2024-07-25 DIAGNOSIS — Z604 Social exclusion and rejection: Secondary | ICD-10-CM | POA: Diagnosis not present

## 2024-07-25 DIAGNOSIS — I110 Hypertensive heart disease with heart failure: Secondary | ICD-10-CM | POA: Diagnosis not present

## 2024-07-25 DIAGNOSIS — Z7951 Long term (current) use of inhaled steroids: Secondary | ICD-10-CM | POA: Diagnosis not present

## 2024-07-25 DIAGNOSIS — E039 Hypothyroidism, unspecified: Secondary | ICD-10-CM | POA: Diagnosis not present

## 2024-07-25 DIAGNOSIS — Z950 Presence of cardiac pacemaker: Secondary | ICD-10-CM | POA: Diagnosis not present

## 2024-07-25 DIAGNOSIS — M25511 Pain in right shoulder: Secondary | ICD-10-CM | POA: Diagnosis not present

## 2024-07-25 DIAGNOSIS — E66813 Obesity, class 3: Secondary | ICD-10-CM | POA: Diagnosis not present

## 2024-07-25 DIAGNOSIS — I4892 Unspecified atrial flutter: Secondary | ICD-10-CM | POA: Diagnosis not present

## 2024-07-25 DIAGNOSIS — J9611 Chronic respiratory failure with hypoxia: Secondary | ICD-10-CM | POA: Diagnosis not present

## 2024-07-25 DIAGNOSIS — Z7901 Long term (current) use of anticoagulants: Secondary | ICD-10-CM | POA: Diagnosis not present

## 2024-07-25 DIAGNOSIS — G894 Chronic pain syndrome: Secondary | ICD-10-CM | POA: Diagnosis not present

## 2024-07-25 DIAGNOSIS — R911 Solitary pulmonary nodule: Secondary | ICD-10-CM | POA: Diagnosis not present

## 2024-07-25 DIAGNOSIS — Z6841 Body Mass Index (BMI) 40.0 and over, adult: Secondary | ICD-10-CM | POA: Diagnosis not present

## 2024-07-25 DIAGNOSIS — J449 Chronic obstructive pulmonary disease, unspecified: Secondary | ICD-10-CM | POA: Diagnosis not present

## 2024-07-31 ENCOUNTER — Telehealth: Payer: Self-pay

## 2024-07-31 DIAGNOSIS — E039 Hypothyroidism, unspecified: Secondary | ICD-10-CM | POA: Diagnosis not present

## 2024-07-31 DIAGNOSIS — Z950 Presence of cardiac pacemaker: Secondary | ICD-10-CM | POA: Diagnosis not present

## 2024-07-31 DIAGNOSIS — Z7951 Long term (current) use of inhaled steroids: Secondary | ICD-10-CM | POA: Diagnosis not present

## 2024-07-31 DIAGNOSIS — R911 Solitary pulmonary nodule: Secondary | ICD-10-CM | POA: Diagnosis not present

## 2024-07-31 DIAGNOSIS — I5032 Chronic diastolic (congestive) heart failure: Secondary | ICD-10-CM | POA: Diagnosis not present

## 2024-07-31 DIAGNOSIS — E785 Hyperlipidemia, unspecified: Secondary | ICD-10-CM | POA: Diagnosis not present

## 2024-07-31 DIAGNOSIS — M25511 Pain in right shoulder: Secondary | ICD-10-CM | POA: Diagnosis not present

## 2024-07-31 DIAGNOSIS — Z7984 Long term (current) use of oral hypoglycemic drugs: Secondary | ICD-10-CM | POA: Diagnosis not present

## 2024-07-31 DIAGNOSIS — E66813 Obesity, class 3: Secondary | ICD-10-CM | POA: Diagnosis not present

## 2024-07-31 DIAGNOSIS — Z7901 Long term (current) use of anticoagulants: Secondary | ICD-10-CM | POA: Diagnosis not present

## 2024-07-31 DIAGNOSIS — Z87891 Personal history of nicotine dependence: Secondary | ICD-10-CM | POA: Diagnosis not present

## 2024-07-31 DIAGNOSIS — G894 Chronic pain syndrome: Secondary | ICD-10-CM | POA: Diagnosis not present

## 2024-07-31 DIAGNOSIS — Z6841 Body Mass Index (BMI) 40.0 and over, adult: Secondary | ICD-10-CM | POA: Diagnosis not present

## 2024-07-31 DIAGNOSIS — Z604 Social exclusion and rejection: Secondary | ICD-10-CM | POA: Diagnosis not present

## 2024-07-31 DIAGNOSIS — M25512 Pain in left shoulder: Secondary | ICD-10-CM | POA: Diagnosis not present

## 2024-07-31 DIAGNOSIS — J9611 Chronic respiratory failure with hypoxia: Secondary | ICD-10-CM | POA: Diagnosis not present

## 2024-07-31 DIAGNOSIS — I4892 Unspecified atrial flutter: Secondary | ICD-10-CM | POA: Diagnosis not present

## 2024-07-31 DIAGNOSIS — I480 Paroxysmal atrial fibrillation: Secondary | ICD-10-CM | POA: Diagnosis not present

## 2024-07-31 DIAGNOSIS — I110 Hypertensive heart disease with heart failure: Secondary | ICD-10-CM | POA: Diagnosis not present

## 2024-07-31 DIAGNOSIS — J449 Chronic obstructive pulmonary disease, unspecified: Secondary | ICD-10-CM | POA: Diagnosis not present

## 2024-08-01 ENCOUNTER — Ambulatory Visit
Admission: RE | Admit: 2024-08-01 | Discharge: 2024-08-01 | Disposition: A | Discharge status: Home / Self Care | Attending: Nurse Practitioner | Admitting: Nurse Practitioner

## 2024-08-01 ENCOUNTER — Ambulatory Visit
Admission: RE | Admit: 2024-08-01 | Discharge: 2024-08-01 | Disposition: A | Discharge status: Home / Self Care | Source: Ambulatory Visit | Attending: Nurse Practitioner | Admitting: Nurse Practitioner

## 2024-08-01 ENCOUNTER — Telehealth: Payer: Self-pay | Admitting: Nurse Practitioner

## 2024-08-01 ENCOUNTER — Encounter: Payer: Self-pay | Admitting: Nurse Practitioner

## 2024-08-01 ENCOUNTER — Ambulatory Visit: Admitting: Nurse Practitioner

## 2024-08-01 ENCOUNTER — Ambulatory Visit: Payer: Self-pay | Admitting: Internal Medicine

## 2024-08-01 VITALS — BP 133/66 | HR 73 | Temp 95.8°F | Resp 16 | Ht <= 58 in | Wt 193.0 lb

## 2024-08-01 DIAGNOSIS — M549 Dorsalgia, unspecified: Secondary | ICD-10-CM | POA: Diagnosis not present

## 2024-08-01 DIAGNOSIS — M5126 Other intervertebral disc displacement, lumbar region: Secondary | ICD-10-CM | POA: Diagnosis not present

## 2024-08-01 DIAGNOSIS — K8020 Calculus of gallbladder without cholecystitis without obstruction: Secondary | ICD-10-CM

## 2024-08-01 DIAGNOSIS — K7460 Unspecified cirrhosis of liver: Secondary | ICD-10-CM

## 2024-08-01 DIAGNOSIS — G8929 Other chronic pain: Secondary | ICD-10-CM | POA: Insufficient documentation

## 2024-08-01 DIAGNOSIS — I5033 Acute on chronic diastolic (congestive) heart failure: Secondary | ICD-10-CM | POA: Diagnosis not present

## 2024-08-01 DIAGNOSIS — M47814 Spondylosis without myelopathy or radiculopathy, thoracic region: Secondary | ICD-10-CM | POA: Diagnosis not present

## 2024-08-01 DIAGNOSIS — I38 Endocarditis, valve unspecified: Secondary | ICD-10-CM | POA: Diagnosis not present

## 2024-08-01 DIAGNOSIS — M545 Low back pain, unspecified: Secondary | ICD-10-CM | POA: Diagnosis not present

## 2024-08-01 DIAGNOSIS — Z9181 History of falling: Secondary | ICD-10-CM | POA: Insufficient documentation

## 2024-08-01 DIAGNOSIS — M47816 Spondylosis without myelopathy or radiculopathy, lumbar region: Secondary | ICD-10-CM | POA: Diagnosis not present

## 2024-08-01 DIAGNOSIS — M4317 Spondylolisthesis, lumbosacral region: Secondary | ICD-10-CM | POA: Diagnosis not present

## 2024-08-01 DIAGNOSIS — Z950 Presence of cardiac pacemaker: Secondary | ICD-10-CM | POA: Diagnosis not present

## 2024-08-01 DIAGNOSIS — M51369 Other intervertebral disc degeneration, lumbar region without mention of lumbar back pain or lower extremity pain: Secondary | ICD-10-CM | POA: Diagnosis not present

## 2024-08-01 DIAGNOSIS — I700 Atherosclerosis of aorta: Secondary | ICD-10-CM | POA: Diagnosis not present

## 2024-08-01 NOTE — Telephone Encounter (Signed)
 Notified patient of abdominal U/S appointment date, arrival time, location and npo 8 hrs prior-Toni

## 2024-08-01 NOTE — Telephone Encounter (Signed)
 As per alyssa made pt appt today

## 2024-08-01 NOTE — Progress Notes (Signed)
 [facility] [address] [place], [place] [address]  Internal MEDICINE  Office Visit Note  Patient Name: [name]  [id number]  [id number]  Date of Service: 08/01/2024  Chief Complaint  Patient presents with   Acute Visit    Right side back pain     HPI [name] presents for an acute sick visit for RUQ pain and back pain  --having increased  RUQ pain related to gall stones -- recent fall with increased mid back pain and she already has chronic low back pain . History cirrhosis and CHF.     Current Medication:  Outpatient Encounter Medications as of 08/01/2024  Medication Sig   BREZTRI  AEROSPHERE 160-9-4.8 MCG/ACT AERO inhaler INHALE 2 PUFFS INTO THE LUNGS TWICE DAILY   bumetanide  (BUMEX ) 1 MG tablet TAKE 2 TABLETS BY MOUTH TWICE DAILY, ADDITIONAL 2 TABLETS IN THE EVENING AS NEEDED.   Calcium  Carbonate-Vitamin D  (CALCIUM  600+D PO) Take 1 tablet by mouth daily.   carvedilol  (COREG ) 25 MG tablet Take 25 mg by mouth 2 (two) times daily with a meal.   Cholecalciferol  (VITAMIN D3) 10 MCG (400 UNIT) CAPS Take by mouth daily.   Coenzyme Q10 (COQ10) 200 MG CAPS Take 200 mg by mouth daily.   diclofenac  Sodium (VOLTAREN ) 1 % GEL Apply 2 g topically 4 (four) times daily.   ELIQUIS  5 MG TABS tablet TAKE 1 TABLET BY MOUTH TWICE DAILY   levothyroxine  (SYNTHROID ) 50 MCG tablet Take 1 tablet (50 mcg total) by mouth daily.   loratadine  (CLARITIN ) 10 MG tablet Take 10 mg by mouth daily.   pantoprazole  (PROTONIX ) 40 MG tablet Take 1 tablet (40 mg total) by mouth daily.   potassium chloride  (KLOR-CON ) 10 MEQ tablet TAKE 4 TABLETS BY MOUTH TWICE DAILY   sodium chloride  (OCEAN) 0.65 % SOLN nasal spray Place 1 spray into both nostrils as needed for congestion.   [DISCONTINUED] albuterol  (VENTOLIN  HFA) 108 (90 Base) MCG/ACT inhaler Inhale 2 puffs into the lungs every 6 (six) hours as needed for wheezing or shortness of breath.   [DISCONTINUED] ALPRAZolam  (XANAX ) 0.25 MG tablet Take 1  tablet (0.25 mg total) by mouth 2 (two) times daily as needed for anxiety.   [DISCONTINUED] atorvastatin  (LIPITOR) 40 MG tablet Take 1 tablet (40 mg total) by mouth at bedtime.   [DISCONTINUED] empagliflozin  (JARDIANCE ) 10 MG TABS tablet Take 1 tablet (10 mg total) by mouth daily.   [DISCONTINUED] gabapentin  (NEURONTIN ) 100 MG capsule Take 1 capsule (100 mg total) by mouth at bedtime.   [DISCONTINUED] lisinopril  (ZESTRIL ) 40 MG tablet TAKE 1 TABLET(40 MG) BY MOUTH DAILY   [DISCONTINUED] oxyCODONE -acetaminophen  (PERCOCET) 10-325 MG tablet Take 1 tablet by mouth 2 (two) times daily as needed for pain.   [DISCONTINUED] oxyCODONE -acetaminophen  (PERCOCET) 10-325 MG tablet Take 1 tablet by mouth 2 (two) times daily as needed for pain.   [DISCONTINUED] oxyCODONE -acetaminophen  (PERCOCET) 10-325 MG tablet Take 1 tablet by mouth 2 (two) times daily as needed for pain.   [DISCONTINUED] sertraline  (ZOLOFT ) 25 MG tablet Take 1 tablet by mouth daily for 1 week then increase to 2 tablets by mouth daily.   [DISCONTINUED] traZODone  (DESYREL ) 50 MG tablet Take 1 tablet (50 mg total) by mouth at bedtime.   [DISCONTINUED] triamcinolone  cream (KENALOG ) 0.1 % Apply 1 Application topically 2 (two) times daily as needed (burning dry skin).   [DISCONTINUED] TURMERIC PO Take by mouth daily at 6 (six) AM.   No facility-administered encounter medications on file as of 08/01/2024.  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 s/p bioprosthetic valve replacement 2008 at [facility];  b. 01/2015 Echo: EF 60-65%, no rwma, Gr1 DD, mildly dil LA, nl RV fxn.   Aspiration pneumonia ([name])    Atrial fibrillation with RVR ([name]) 01/11/2015   Atrial flutter ([name])    a. 08/2016 s/p DCCV.  Remains on flecainide  50 mg bid.   Basal skull fracture ([name]) 20 yrs ago   CHF (congestive heart failure) ([name])    Chronic respiratory failure ([name])    COPD (chronic obstructive  pulmonary disease) ([name])    a. 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 2008 prior to Aortic aneurysm repair-->nl cors.   History of stress test    a. 10/2015 MV: no ischemia/infarct.   HLD (hyperlipidemia)    HTN (hypertension)    Hypothyroidism    Obesity    PAF (paroxysmal atrial fibrillation) ([name])    a. on Eliquis ; b. CHADS2VASc = 3 (HTN, age x 1, female).   Paroxysmal atrial fibrillation ([name]) 01/24/2015   Right upper quadrant abdominal tenderness without rebound tenderness 03/02/2018   S/P ascending aortic aneurysm repair 2008     Vital Signs: BP 133/66   Pulse 73   Temp (!) 95.8 F (35.4 C)   Resp 16   Ht 4' 10 (1.473 m)   Wt 193 lb (87.5 kg)   SpO2 95% Comment: 5L  BMI 40.34 kg/m    Review of Systems  Constitutional:  Negative for fatigue and fever.  HENT:  Negative for congestion, mouth sores and postnasal drip.   Respiratory:  Positive for shortness of breath. Negative for cough.   Cardiovascular:  Negative for chest pain.  Gastrointestinal:  Positive for abdominal distention, abdominal pain, diarrhea and nausea. Negative for vomiting.  Genitourinary:  Negative for flank pain.  Musculoskeletal:  Positive for arthralgias, back pain and gait problem.  Neurological:  Positive for weakness.  Psychiatric/Behavioral: Negative.      Physical Exam Vitals reviewed.  Constitutional:      General: She is not in acute distress.    Appearance: Normal appearance. She is obese. She is not ill-appearing.  HENT:     Head: Normocephalic and atraumatic.     Nose: Nose normal.     Mouth/Throat:     Mouth: Mucous membranes are moist.     Pharynx: No posterior oropharyngeal erythema.  Eyes:     Extraocular Movements: Extraocular movements intact.     Pupils: Pupils are equal, round, and reactive to light.  Cardiovascular:     Pulses: Normal pulses.      Heart sounds: Normal heart sounds.  Pulmonary:     Effort: Respiratory distress present.     Comments: Decreased bs, on oxygen   Abdominal:     General: Bowel sounds are normal. There is distension.     Palpations: Abdomen is soft.     Tenderness: There is abdominal tenderness in the right upper quadrant and right lower quadrant.  Musculoskeletal:        General: Deformity present.  Skin:    Capillary Refill: Capillary refill takes less than 2 seconds.  Neurological:     General: No focal deficit present.     Mental Status: She is alert.  Psychiatric:  Mood and Affect: Mood normal.        Behavior: Behavior normal.       Assessment/Plan: 1. Calculus of gallbladder without cholecystitis without obstruction (Primary) RUQ ultrasound ordered  - US  Abdomen Limited RUQ (LIVER/GB); Future  2. Upper back pain on right side Xrays ordered to rule out vertebral fracture - DG Lumbar Spine Complete; Future - DG Thoracic Spine W/Swimmers; Future  3. 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 Cirrhosis of liver without ascites, unspecified hepatic cirrhosis type ([name]) Noted and is stable for now  5. Heart failure due to valvular disease, acute on chronic, diastolic ([name]) Continue medications as prescribed.   6. History of recent fall Xrays ordered to rule out vertebral fracture - DG Lumbar Spine Complete; Future - DG Thoracic Spine W/Swimmers; Future   General Counseling: Sundus verbalizes understanding of the findings of todays visit and agrees with plan of treatment. I have discussed any further diagnostic evaluation that may be needed or ordered today. We also reviewed her medications today. she has been encouraged to call the office with any questions or concerns that should arise related to todays visit.    Counseling:    Orders Placed  This Encounter  Procedures   DG Lumbar Spine Complete   US  Abdomen Limited RUQ (LIVER/GB)   DG Thoracic Spine W/Swimmers    No orders of the defined types were placed in this encounter.   Return for F/U, Dartanion Teo PCP for xray and ultrasound results in a few weeks .   Controlled Substance Database was reviewed by me for overdose risk score (ORS)  Time spent:30 Minutes Time spent with patient included reviewing progress notes, labs, imaging studies, and discussing plan for follow up.   This patient was seen by Mardy Maxin, FNP-C in collaboration with Dr. Sigrid Bathe as a part of collaborative care agreement.  Milia Warth R. Maxin, MSN, FNP-C Internal Medicine

## 2024-08-02 DIAGNOSIS — G4733 Obstructive sleep apnea (adult) (pediatric): Secondary | ICD-10-CM | POA: Diagnosis not present

## 2024-08-04 ENCOUNTER — Ambulatory Visit: Admission: RE | Admit: 2024-08-04 | Source: Ambulatory Visit

## 2024-08-06 ENCOUNTER — Other Ambulatory Visit: Payer: Self-pay | Admitting: Nurse Practitioner

## 2024-08-06 DIAGNOSIS — I5032 Chronic diastolic (congestive) heart failure: Secondary | ICD-10-CM

## 2024-08-07 ENCOUNTER — Telehealth: Payer: Self-pay

## 2024-08-07 ENCOUNTER — Telehealth: Payer: Self-pay | Admitting: Radiation Oncology

## 2024-08-07 NOTE — Telephone Encounter (Signed)
 Called pt to r/s missed CT - confirmed new date/time w/pt for CT - pt wrote down new appt info - LH

## 2024-08-08 ENCOUNTER — Emergency Department
Admission: EM | Admit: 2024-08-08 | Discharge: 2024-08-08 | Disposition: A | Discharge status: Home / Self Care | Attending: Emergency Medicine | Admitting: Emergency Medicine

## 2024-08-08 ENCOUNTER — Other Ambulatory Visit: Payer: Self-pay

## 2024-08-08 ENCOUNTER — Emergency Department

## 2024-08-08 DIAGNOSIS — Z7901 Long term (current) use of anticoagulants: Secondary | ICD-10-CM | POA: Insufficient documentation

## 2024-08-08 DIAGNOSIS — I10 Essential (primary) hypertension: Secondary | ICD-10-CM | POA: Diagnosis not present

## 2024-08-08 DIAGNOSIS — I4891 Unspecified atrial fibrillation: Secondary | ICD-10-CM | POA: Diagnosis not present

## 2024-08-08 DIAGNOSIS — I700 Atherosclerosis of aorta: Secondary | ICD-10-CM | POA: Diagnosis not present

## 2024-08-08 DIAGNOSIS — J449 Chronic obstructive pulmonary disease, unspecified: Secondary | ICD-10-CM | POA: Insufficient documentation

## 2024-08-08 DIAGNOSIS — R918 Other nonspecific abnormal finding of lung field: Secondary | ICD-10-CM | POA: Diagnosis not present

## 2024-08-08 DIAGNOSIS — E039 Hypothyroidism, unspecified: Secondary | ICD-10-CM | POA: Insufficient documentation

## 2024-08-08 DIAGNOSIS — R0602 Shortness of breath: Secondary | ICD-10-CM | POA: Diagnosis not present

## 2024-08-08 DIAGNOSIS — I517 Cardiomegaly: Secondary | ICD-10-CM | POA: Diagnosis not present

## 2024-08-08 LAB — BASIC METABOLIC PANEL WITH GFR
Anion gap: 12 (ref 5–15)
BUN: 26 mg/dL — ABNORMAL HIGH (ref 8–23)
CO2: 28 mmol/L (ref 22–32)
Calcium: 9.5 mg/dL (ref 8.9–10.3)
Chloride: 97 mmol/L — ABNORMAL LOW (ref 98–111)
Creatinine, Ser: 0.92 mg/dL (ref 0.44–1.00)
GFR, Estimated: >60 mL/min (ref >60)
Glucose, Bld: 128 mg/dL — ABNORMAL HIGH (ref 70–99)
Potassium: 3.9 mmol/L (ref 3.5–5.1)
Sodium: 136 mmol/L (ref 135–145)

## 2024-08-08 LAB — CBC
HCT: 35.5 % — ABNORMAL LOW (ref 36.0–46.0)
Hemoglobin: 11 g/dL — ABNORMAL LOW (ref 12.0–15.0)
MCH: 27.2 pg (ref 26.0–34.0)
MCHC: 31 g/dL (ref 30.0–36.0)
MCV: 87.9 fL (ref 80.0–100.0)
Platelets: 257 K/uL (ref 150–400)
RBC: 4.04 MIL/uL (ref 3.87–5.11)
RDW: 17.3 % — ABNORMAL HIGH (ref 11.5–15.5)
WBC: 7 K/uL (ref 4.0–10.5)
nRBC: 0 % (ref 0.0–0.2)

## 2024-08-08 LAB — PRO BRAIN NATRIURETIC PEPTIDE: Pro Brain Natriuretic Peptide: 796 pg/mL — ABNORMAL HIGH (ref <300.0)

## 2024-08-08 LAB — HEPATIC FUNCTION PANEL
ALT: 13 U/L (ref 0–44)
AST: 18 U/L (ref 15–41)
Albumin: 4 g/dL (ref 3.5–5.0)
Alkaline Phosphatase: 92 U/L (ref 38–126)
Bilirubin, Direct: 0.1 mg/dL (ref 0.0–0.2)
Indirect Bilirubin: 0.1 mg/dL — ABNORMAL LOW (ref 0.3–0.9)
Total Bilirubin: 0.2 mg/dL (ref 0.0–1.2)
Total Protein: 7.2 g/dL (ref 6.5–8.1)

## 2024-08-08 LAB — TROPONIN T, HIGH SENSITIVITY: Troponin T High Sensitivity: 16 ng/L (ref 0–19)

## 2024-08-08 MED ORDER — BUMETANIDE 0.25 MG/ML IJ SOLN
2.0000 mg | Freq: Once | INTRAMUSCULAR | Status: AC
  Administered 2024-08-08: 2 mg via INTRAVENOUS
  Filled 2024-08-08: qty 8

## 2024-08-08 MED ORDER — ONDANSETRON HCL 4 MG/2ML IJ SOLN
4.0000 mg | Freq: Once | INTRAMUSCULAR | Status: AC
  Administered 2024-08-08: 4 mg via INTRAVENOUS
  Filled 2024-08-08: qty 2

## 2024-08-08 NOTE — Discharge Instructions (Signed)
 You were seen in the emergency department following an episode of nausea with laying back whenever you were at your cardiologist office.  Your heart enzyme was normal today.  Your EKG did not show a heart attack.  Concerned that you have some extra fluid on your lungs.  No extra fluid found on your chest x-ray.  You are given an extra dose of IV Bumex .  It is important that you take your Bumex  as prescribed.  Call your primary care physician to discuss close follow-up and call your cardiologist to discuss the echocardiogram and your device check.  We discussed admission to the hospital but you wanted to go home with close follow-up.  Please return to the emergency department for any ongoing or worsening symptoms.

## 2024-08-08 NOTE — Telephone Encounter (Signed)
 Patient notified

## 2024-08-08 NOTE — Telephone Encounter (Signed)
 No acute changes or abnormalities, no fractures. Only thing noted is age related degenerative changes

## 2024-08-08 NOTE — ED Provider Notes (Signed)
 [facility] Provider Note    Event Date/Time   First MD Initiated Contact with Patient 08/08/24 1551     (approximate)   History   Shortness of Breath   HPI  [name] is a 78 y.o. female past medical history significant for atrial fibrillation on Eliquis , COPD on chronic 5 L of home oxygen , hypertension, hyperlipidemia, hypothyroidism, obesity, ascending aortic aneurysm repair, aortic stenosis with bicuspid aortic valve status post bioprosthetic valve replacement, who presents to the emergency department with shortness of breath patient states that she has had progressively worsening shortness of breath over the past couple of days.  Feels like she cannot catch her breath.  Feels like she is having extra fluid buildup.  Has gained 4 pounds over the past couple of days.  States that she has significant abdominal distention which occurs whenever she has fluid buildup.  States that she is having normal bowel movements and no significant abdominal pain.  Today she went to go to [place] to get echocardiogram and pacemaker checked.  States that she was unable to lay flat because she had a wave of feeling nauseous, chest pain and worsening shortness of breath.  Had to sit back up.  Sent to the emergency department for further evaluation.  States that she has been having good urine output with her Bumex .  Denies any significant chest pain.  Denies any significant fever or chills.  No significant cough.  No worsening swelling to her legs but she states that she does not get swelling in her legs whenever she has fluid buildup.  No prior history of DVT or PE.     Physical Exam   Triage Vital Signs: ED Triage Vitals [08/08/24 1524]  Encounter Vitals Group     BP 132/73     Girls Systolic BP Percentile      Girls Diastolic BP Percentile      Boys Systolic BP Percentile      Boys Diastolic BP Percentile      Pulse Rate 72     Resp (!) 24     Temp 98.5 F (36.9  C)     Temp Source Oral     SpO2 98 %     Weight      Height      Head Circumference      Peak Flow      Pain Score 5     Pain Loc      Pain Education      Exclude from Growth Chart     Most recent vital signs: Vitals:   08/08/24 1524  BP: 132/73  Pulse: 72  Resp: (!) 24  Temp: 98.5 F (36.9 C)  SpO2: 98%    Physical Exam Constitutional:      Appearance: She is well-developed.  HENT:     Head: Atraumatic.  Eyes:     Conjunctiva/sclera: Conjunctivae normal.  Cardiovascular:     Rate and Rhythm: Regular rhythm.  Pulmonary:     Effort: Tachypnea present. No respiratory distress.     Breath sounds: Examination of the right-lower field reveals rales. Examination of the left-lower field reveals rales. Rales present.     Comments: Patient requiring 6 L nasal cannula, tachypneic and speaking in 2 word sentences.  Lungs are without significant wheezing.  Mild crackles to lower lung fields. Chest:     Chest wall: No tenderness.     Comments: Abdominal distention Abdominal:     General:  There is no distension.     Palpations: Abdomen is soft.     Tenderness: There is no abdominal tenderness.  Musculoskeletal:        General: Normal range of motion.     Cervical back: Normal range of motion.     Right lower leg: No edema.     Left lower leg: No edema.  Skin:    General: Skin is warm.     Capillary Refill: Capillary refill takes less than 2 seconds.  Neurological:     General: No focal deficit present.     Mental Status: She is alert. Mental status is at baseline.     IMPRESSION / MDM / ASSESSMENT AND PLAN / ED COURSE  I reviewed the triage vital signs and the nursing notes.  Differential diagnosis including ACS, dysrhythmia, anemia, CHF exacerbation, COPD exacerbation, viral illness including COVID/influenza, pneumonia, anemia  Tachypneic, 6 L nasal cannula  EKG  I, Clotilda Punter, the attending physician, personally viewed and interpreted this ECG.  Paced  rhythm, unchanged when compared to prior EKG.  No findings of acute ischemia.  Negative Sgarbossa's criteria.  RADIOLOGY I independently reviewed imaging, my interpretation of imaging: Chest x-ray with no significant pulmonary edema, no pneumothorax and no pneumonia.  Read as no acute findings  LABS (all labs ordered are listed, but only abnormal results are displayed) Labs interpreted as -    Labs Reviewed  BASIC METABOLIC PANEL WITH GFR - Abnormal; Notable for the following components:      Result Value   Chloride 97 (*)    Glucose, Bld 128 (*)    BUN 26 (*)    All other components within normal limits  CBC - Abnormal; Notable for the following components:   Hemoglobin 11.0 (*)    HCT 35.5 (*)    RDW 17.3 (*)    All other components within normal limits  PRO BRAIN NATRIURETIC PEPTIDE - Abnormal; Notable for the following components:   Pro Brain Natriuretic Peptide 796.0 (*)    All other components within normal limits  HEPATIC FUNCTION PANEL - Abnormal; Notable for the following components:   Indirect Bilirubin 0.1 (*)    All other components within normal limits  TROPONIN T, HIGH SENSITIVITY     MDM  Patient presents to the emergency department having an episode of nausea when laying back flat at her cardiologist office today.  Patient states she is feeling better sitting up at this time.  Patient is currently on 6 L nasal cannula and usually has baseline of 5 L nasal cannula.  Abdomen nontender to palpation but does appear distended, have a very low suspicion for small bowel obstruction or intra-abdominal pathology.  No findings concerning for acute cholecystitis or symptomatic cholelithiasis.  Normal LFTs and T. bili.  Troponin is negative -patient has a heart score of 5.  Discussed admission for further cardiac workup including echocardiogram and ACS workup.  Patient declined at this time and states that she wants to go home and follow-up as an outpatient.  No active chest  pain at this time.  No further episodes since cardiologist office today.  Was given an extra dose of IV Bumex  and antiemetics.  Have a low suspicion for pulmonary embolism, no pleuritic chest pain, compliant with her Eliquis  and no findings of DVT on exam.  No significant wheezing, have a low suspicion for COPD exacerbation no findings of pneumonia.  Discussed admission however patient states that she wants to go home and  follow-up as an outpatient.  Discussed close follow-up with her primary care physician in the next 1 to 2 days.  Discussed calling her cardiologist given that she did not have a chance to get her echocardiogram.  Discussed return for any ongoing or worsening symptoms.     PROCEDURES:  Critical Care performed: No  Procedures  Patient's presentation is most consistent with acute presentation with potential threat to life or bodily function.   MEDICATIONS ORDERED IN ED: Medications  ondansetron  (ZOFRAN ) injection 4 mg (4 mg Intravenous Given 08/08/24 1703)  bumetanide  (BUMEX ) injection 2 mg (2 mg Intravenous Given 08/08/24 1704)    FINAL CLINICAL IMPRESSION(S) / ED DIAGNOSES   Final diagnoses:  SOB (shortness of breath)     Rx / DC Orders   ED Discharge Orders     None        Note:  This document was prepared using Dragon voice recognition software and may include unintentional dictation errors.   Suzanne Kirsch, MD 08/08/24 1850

## 2024-08-08 NOTE — ED Triage Notes (Signed)
 Pt says she has gained 4 lbs in 4 days and hx of CHF. Pt reports she is more short of breath. She wears oxygen  5lpm at home. Pt concerned she has put on extra fluid.

## 2024-08-09 ENCOUNTER — Telehealth: Payer: Self-pay | Admitting: Nurse Practitioner

## 2024-08-09 NOTE — Telephone Encounter (Signed)
 Lvm to schedule ED follow up-Toni

## 2024-08-11 ENCOUNTER — Ambulatory Visit

## 2024-08-14 ENCOUNTER — Ambulatory Visit
Admission: RE | Admit: 2024-08-14 | Discharge: 2024-08-14 | Disposition: A | Source: Ambulatory Visit | Attending: Nurse Practitioner

## 2024-08-14 ENCOUNTER — Ambulatory Visit: Admitting: Radiation Oncology

## 2024-08-14 DIAGNOSIS — R1011 Right upper quadrant pain: Secondary | ICD-10-CM | POA: Diagnosis not present

## 2024-08-14 DIAGNOSIS — K8020 Calculus of gallbladder without cholecystitis without obstruction: Secondary | ICD-10-CM | POA: Diagnosis not present

## 2024-08-14 DIAGNOSIS — K7689 Other specified diseases of liver: Secondary | ICD-10-CM | POA: Diagnosis not present

## 2024-08-15 ENCOUNTER — Ambulatory Visit: Admitting: Nurse Practitioner

## 2024-08-16 ENCOUNTER — Telehealth: Payer: Self-pay

## 2024-08-17 ENCOUNTER — Ambulatory Visit: Admission: RE | Admit: 2024-08-17

## 2024-08-17 ENCOUNTER — Telehealth: Payer: Self-pay | Admitting: Radiation Oncology

## 2024-08-17 NOTE — Progress Notes (Signed)
 Patient has multiple gallstones on her ultrasound. There is no obstruction or signs of inflammation on the ultrasound but these gallstones can move around and are most likely the cause of her abdominal pain.   Treatment is typically removal of the gallbladder via surgery.   Also fatty liver is seen on the ultrasound

## 2024-08-17 NOTE — Telephone Encounter (Signed)
 Called pt to r/s CT. Said she couldn't come in today and that she needs to cancel her appt for 12/15 with Dr Lenn. [place] she's been having gallbladder issues and is going to have to have surgery soon. Pt said she will call back after surgery to r/s CT and MD follow up. I told her that I will call her to r/s if I don't hear from her by the end of Jan. [facility]

## 2024-08-17 NOTE — Telephone Encounter (Signed)
Done by Vanice Sarah

## 2024-08-18 ENCOUNTER — Telehealth: Payer: Self-pay | Admitting: Nurse Practitioner

## 2024-08-18 NOTE — Telephone Encounter (Signed)
 Patient called stating her power came back on and would not need to come to office. She hung up before I could ask what she was talking about-Toni

## 2024-08-21 ENCOUNTER — Ambulatory Visit: Admitting: Radiation Oncology

## 2024-08-21 NOTE — Progress Notes (Signed)
 I have sent a referral to general surgery at [facility].

## 2024-08-22 ENCOUNTER — Telehealth: Payer: Self-pay

## 2024-08-22 DIAGNOSIS — G8929 Other chronic pain: Secondary | ICD-10-CM

## 2024-08-22 MED ORDER — OXYCODONE-ACETAMINOPHEN 10-325 MG PO TABS: 1.0000 | ORAL_TABLET | Freq: Two times a day (BID) | ORAL | 0 refills | Status: DC | PRN

## 2024-08-22 NOTE — Telephone Encounter (Signed)
 Patient notified and already has an appt with them.

## 2024-08-22 NOTE — Telephone Encounter (Signed)
-----   Message from Mardy Maxin, NP sent at 08/21/2024  7:30 AM EST ----- I have sent a referral to general surgery at [facility].  ----- Message ----- From: Donelda Jain, CMA Sent: 08/17/2024   2:21 PM EST To: Mardy Maxin, NP  Patient wants to know can you send her for surgery?  ----- Message ----- From: Maxin Mardy, NP Sent: 08/17/2024   6:57 AM EST To: Jain Donelda, CMA  Patient has multiple gallstones on her ultrasound. There is no obstruction or signs of inflammation on the ultrasound but these gallstones can move around and are most likely the cause of her  abdominal pain.   Treatment is typically removal of the gallbladder via surgery.   Also fatty liver is seen on the ultrasound

## 2024-08-22 NOTE — Telephone Encounter (Signed)
 Patient notified

## 2024-08-28 ENCOUNTER — Ambulatory Visit (INDEPENDENT_AMBULATORY_CARE_PROVIDER_SITE_OTHER): Admitting: Surgery

## 2024-08-28 ENCOUNTER — Encounter: Payer: Self-pay | Admitting: Surgery

## 2024-08-28 VITALS — BP 141/73 | HR 69 | Temp 97.5°F | Ht <= 58 in | Wt 194.0 lb

## 2024-08-28 DIAGNOSIS — K8020 Calculus of gallbladder without cholecystitis without obstruction: Secondary | ICD-10-CM | POA: Diagnosis not present

## 2024-08-28 NOTE — Patient Instructions (Signed)
 Gallbladder Problems: Eating Plan Having high blood cholesterol, obesity, an inactive lifestyle, an unhealthy diet, or diabetes can put you at risk for getting gallstones. If you have a gallbladder problem, you may have trouble digesting fats. You may also have symptoms when you eat a lot of fat. Eating a low-fat diet can help lessen your symptoms. It may be helpful before and after having surgery to have your gallbladder taken out. Your health care provider may recommend that you work with an expert in healthy eating called a dietitian. They can help you lower the amount of fat in your diet. What are tips for following this plan? General guidelines Limit how much fat you have to less than 30% of your total daily calories. If you eat around 1,800 calories each day, you'll be eating less than 60 grams (g) of fat a day. Fat is an important part of a healthy diet. Eating a low-fat diet can make it hard to keep a healthy body weight. Ask your dietitian how much fat, calories, and other nutrients you need each day. Eat small meals often throughout the day instead of 3 large meals. Drink at least 8-10 cups (1.9-2.4 L) of fluid a day. If you drink alcohol: Limit how much you have to: 0-1 drink a day if you're female. 0-2 drinks a day if you're female. Know how much alcohol is in your drink. In the [place] one drink is one 12 oz bottle of beer (355 mL), one 5 oz glass of wine (148 mL), or one 1 oz glass of hard liquor (44 mL). Reading food labels  Check nutrition facts on food labels for how much fat is in a serving. Choose foods with less than 3 grams of fat per serving. Shopping Choose nonfat and low-fat healthy foods. Look for the words nonfat, low-fat, or fat-free. Avoid buying processed or prepackaged foods. Cooking Cook using low-fat methods, such as baking, broiling, grilling, or boiling. Cook with small amounts of healthy fats, such as olive oil, grapeseed oil, canola oil, avocado oil, or  sunflower oil. What foods are recommended?  All fresh, frozen, or canned fruits and vegetables. Whole grains. Low-fat or nonfat (skim) milk and yogurt. Lean meat, skinless poultry, fish, eggs, and beans. Low-fat protein supplement powders or drinks. Spices and herbs. The items listed above may not be all the foods and drinks you can have. Talk with a dietitian to learn more. What foods are not recommended? High-fat foods. These include baked goods, fast food, fatty cuts of meat, ice cream, french toast, sweet rolls, pizza, cheese bread, foods covered with butter, creamy sauces, or cheese. Fried foods. These include french fries, tempura, battered fish, breaded chicken, fried breads, and sweets. Foods that cause bloating and gas. The items listed above may not be all the foods and drinks you should avoid. Talk with a dietitian to learn more. This information is not intended to replace advice given to you by your health care provider. Make sure you discuss any questions you have with your health care provider. Document Revised: 11/30/2023 Document Reviewed: 11/30/2023 Elsevier Patient Education  2025 [employer].

## 2024-08-29 ENCOUNTER — Ambulatory Visit (INDEPENDENT_AMBULATORY_CARE_PROVIDER_SITE_OTHER): Payer: Medicare Other | Admitting: Nurse Practitioner

## 2024-08-29 ENCOUNTER — Encounter: Payer: Self-pay | Admitting: Nurse Practitioner

## 2024-08-29 VITALS — BP 98/62 | HR 75 | Temp 96.8°F | Resp 16 | Ht <= 58 in | Wt 198.4 lb

## 2024-08-29 DIAGNOSIS — F331 Major depressive disorder, recurrent, moderate: Secondary | ICD-10-CM | POA: Diagnosis not present

## 2024-08-29 DIAGNOSIS — R531 Weakness: Secondary | ICD-10-CM

## 2024-08-29 DIAGNOSIS — E782 Mixed hyperlipidemia: Secondary | ICD-10-CM

## 2024-08-29 DIAGNOSIS — F411 Generalized anxiety disorder: Secondary | ICD-10-CM

## 2024-08-29 DIAGNOSIS — Z742 Need for assistance at home and no other household member able to render care: Secondary | ICD-10-CM

## 2024-08-29 DIAGNOSIS — I10 Essential (primary) hypertension: Secondary | ICD-10-CM | POA: Diagnosis not present

## 2024-08-29 DIAGNOSIS — Z0001 Encounter for general adult medical examination with abnormal findings: Secondary | ICD-10-CM | POA: Diagnosis not present

## 2024-08-29 DIAGNOSIS — M545 Low back pain, unspecified: Secondary | ICD-10-CM

## 2024-08-29 DIAGNOSIS — R5381 Other malaise: Secondary | ICD-10-CM

## 2024-08-29 DIAGNOSIS — Z0000 Encounter for general adult medical examination without abnormal findings: Secondary | ICD-10-CM

## 2024-08-29 DIAGNOSIS — M25512 Pain in left shoulder: Secondary | ICD-10-CM

## 2024-08-29 DIAGNOSIS — Z789 Other specified health status: Secondary | ICD-10-CM | POA: Diagnosis not present

## 2024-08-29 DIAGNOSIS — G8929 Other chronic pain: Secondary | ICD-10-CM | POA: Diagnosis not present

## 2024-08-29 DIAGNOSIS — J9611 Chronic respiratory failure with hypoxia: Secondary | ICD-10-CM | POA: Diagnosis not present

## 2024-08-29 DIAGNOSIS — I5032 Chronic diastolic (congestive) heart failure: Secondary | ICD-10-CM | POA: Diagnosis not present

## 2024-08-29 DIAGNOSIS — M25511 Pain in right shoulder: Secondary | ICD-10-CM

## 2024-08-29 DIAGNOSIS — R61 Generalized hyperhidrosis: Secondary | ICD-10-CM

## 2024-08-29 MED ORDER — OXYCODONE-ACETAMINOPHEN 10-325 MG PO TABS: 1.0000 | ORAL_TABLET | Freq: Two times a day (BID) | ORAL | 0 refills | Status: AC | PRN

## 2024-08-29 MED ORDER — SERTRALINE HCL 25 MG PO TABS: ORAL_TABLET | ORAL | 3 refills | Status: AC

## 2024-08-29 MED ORDER — GABAPENTIN 100 MG PO CAPS: 100.0000 mg | ORAL_CAPSULE | Freq: Every day | ORAL | 1 refills | Status: AC

## 2024-08-29 MED ORDER — ALPRAZOLAM 0.25 MG PO TABS: 0.2500 mg | ORAL_TABLET | Freq: Two times a day (BID) | ORAL | 2 refills | Status: AC | PRN

## 2024-08-29 MED ORDER — TRAZODONE HCL 50 MG PO TABS: 50.0000 mg | ORAL_TABLET | Freq: Every day | ORAL | 1 refills | Status: DC

## 2024-08-29 MED ORDER — LISINOPRIL 40 MG PO TABS: 40.0000 mg | ORAL_TABLET | Freq: Every day | ORAL | 1 refills | Status: AC

## 2024-08-29 MED ORDER — ATORVASTATIN CALCIUM 40 MG PO TABS: 40.0000 mg | ORAL_TABLET | Freq: Every day | ORAL | 1 refills | Status: AC

## 2024-08-29 NOTE — Progress Notes (Signed)
 " Surgical Consultation    [name] is an 78 y.o. female.   Chief Complaint  Patient presents with   New Patient (Initial Visit)    Gallstones      HPI: [name] is a very pleasant 78 year old female with multiple comorbidities including history of aortic stenosis status post TAVR, severe COPD on 5 L of home oxygen , recurrent pneumonias, heart failure, pulmonary hypertension.  [name] is currently anticoagulated. [name] presented to the emergency room couple weeks ago complaining of abdominal pain nausea.  [name] reports that the pain is intermittent located in the right upper quadrant sharp and moderate intensity.  No specific alleviating or aggravating factors. Did have a further workup including ultrasound of the right upper quadrant personally reviewed showing evidence of cholelithiasis without cholecystitis.  Normal common bile duct.  LFTs were normal. She does have dyspnea and was able to his trouble from the waiting room to the the room.  She developed significant dyspnea upon doing this Lab review extensive medical records from [facility] who deemed her high risk surgery patient this was a few years ago and her overall health has not improved  Past Medical History:  Diagnosis Date   Aortic 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 s/p bioprosthetic valve replacement 2008 at [facility];  b. 01/2015 Echo: EF 60-65%, no rwma, Gr1 DD, mildly dil LA, nl RV fxn.   Aspiration pneumonia ([name])    Atrial fibrillation with RVR ([name]) 01/11/2015   Atrial flutter ([name])    a. 08/2016 s/p DCCV.  Remains on flecainide  50 mg bid.   Basal skull fracture ([name]) 20 yrs ago   CHF (congestive heart failure) ([name])    Chronic respiratory failure ([name])    COPD (chronic obstructive pulmonary disease) ([name])    a. 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 2008 prior to Aortic aneurysm repair-->nl  cors.   History of stress test    a. 10/2015 MV: no ischemia/infarct.   HLD (hyperlipidemia)    HTN (hypertension)    Hypothyroidism    Obesity    PAF (paroxysmal atrial fibrillation) ([name])    a. on Eliquis ; b. CHADS2VASc = 3 (HTN, age x 1, female).   Paroxysmal atrial fibrillation ([name]) 01/24/2015   Right upper quadrant abdominal tenderness without rebound tenderness 03/02/2018   S/P ascending aortic aneurysm repair 2008    Past Surgical History:  Procedure Laterality Date   ABDOMINAL AORTIC ANEURYSM REPAIR  2008   ABDOMINAL HYSTERECTOMY     AORTIC VALVE REPLACEMENT  2008   CARDIAC CATHETERIZATION     [facility]   CARDIOVERSION N/A 08/15/2018   Procedure: CARDIOVERSION;  Surgeon: Darron Deatrice LABOR, MD;  Location: [facility] ORS;  Service: Cardiovascular;  Laterality: N/A;   CARDIOVERSION N/A 11/14/2018   Procedure: CARDIOVERSION (CATH LAB);  Surgeon: Darron Deatrice LABOR, MD;  Location: [facility] ORS;  Service: Cardiovascular;  Laterality: N/A;   CARDIOVERSION N/A 06/05/2019   Procedure: CARDIOVERSION;  Surgeon: Darron Deatrice LABOR, MD;  Location: [facility] ORS;  Service: Cardiovascular;  Laterality: N/A;   CARDIOVERSION N/A 08/14/2019   Procedure: CARDIOVERSION;  Surgeon: Darron Deatrice LABOR, MD;  Location: [facility] ORS;  Service: Cardiovascular;  Laterality: N/A;   CARDIOVERSION N/A 11/09/2019   Procedure: CARDIOVERSION;  Surgeon: Ammon Blunt, MD;  Location: [facility] ORS;  Service: Cardiovascular;  Laterality: N/A;   CARDIOVERSION N/A 01/17/2020   Procedure: CARDIOVERSION;  Surgeon: Ammon Blunt, MD;  Location: [facility] ORS;  Service: Cardiovascular;  Laterality: N/A;   CARPAL TUNNEL RELEASE     ELECTROPHYSIOLOGIC STUDY N/A 08/17/2016   Procedure: Cardioversion;  Surgeon: Deatrice DELENA Cage, MD;  Location: [facility] ORS;  Service: Cardiovascular;  Laterality: N/A;   TUMOR EXCISION Left    x3 (arm)    Family History  Problem Relation Age of Onset   Stroke Father    Stroke Paternal Grandmother    Breast cancer  Neg Hx     Social History:  reports that she has quit smoking. Her smoking use included cigarettes. She has never used smokeless tobacco. She reports that she does not drink alcohol and does not use drugs.  Allergies: Allergies[1]  Medications reviewed.     ROS Full ROS performed and is otherwise negative other than what is stated in the HPI    BP (!) 141/73   Pulse 69   Temp (!) 97.5 F (36.4 C) (Oral)   Ht 4' 10 (1.473 m)   Wt 194 lb (88 kg)   SpO2 94%   BMI 40.55 kg/m   Physical Exam Vitals and nursing note reviewed.  Constitutional:      Appearance: She is obese.     Comments: SHe is chronically ill and frail  Neck:     Vascular: No carotid bruit.  Cardiovascular:     Rate and Rhythm: Normal rate.  Pulmonary:     Breath sounds: Rhonchi present.     Comments: She does have dyspnea even at rest and is wearing 5 L of oxygen  Abdominal:     General: There is no distension.     Palpations: There is no mass.     Tenderness: There is abdominal tenderness. There is no rebound.     Hernia: No hernia is present.     Comments: Mild tenderness palpation right upper quadrant without peritonitis or rebound.  Musculoskeletal:     Cervical back: Normal range of motion and neck supple. No rigidity or tenderness.  Lymphadenopathy:     Cervical: No cervical adenopathy.  Skin:    General: Skin is warm and dry.     Capillary Refill: Capillary refill takes less than 2 seconds.  Neurological:     General: No focal deficit present.     Mental Status: She is oriented to person, place, and time.  Psychiatric:        Mood and Affect: Mood normal.        Behavior: Behavior normal.        Thought Content: Thought content normal.        Judgment: Judgment normal.       Assessment/Plan: [name] is a 78 year old very pleasant female with history of severe COPD on 5 L home oxygen , BMI of 41, pulmonary hypertension , CHF, anticoagulated and poor physiologic reserve.  She presents  with abdominal pain and potential biliary colic.  Fortunately no evidence of acute cholecystitis or evidence of a pathology that requires immediate surgical intervention at this time.  I had an extensive discussion with the patient about her disease process.  I am very concerned that she may not survive general anesthesia due to her poor lung capacity and overall poor physiologic reserve.  She completely understands this.  I have also asked Dr Duayne at [facility] who has seen her in the past, IF she requires surgery probably best place to have it done is  [facility]. We do not have cardiac anesthesia that might be required due to her pulm HTN. May further arrangements to be evaluated at [facility] general surgery  I personally spent a total of 60 minutes in the care of the patient today including performing a medically appropriate exam/evaluation, counseling and educating, placing orders, referring and communicating with other health care professionals, documenting clinical information in the EHR, independently interpreting and reviewing images studies and coordinating care.      Laneta Luna, MD FACS General Surgeon     [1]  Allergies Allergen Reactions   Benadryl [Diphenhydramine Hcl (Sleep)] Palpitations   Cetirizine Palpitations   Lasix  [Furosemide ] Rash   Levaquin [Levofloxacin In D5w] Other (See Comments)    Reaction:  Fatigue and muscle soreness   Meloxicam Rash   Sulfa Antibiotics Rash   "

## 2024-08-29 NOTE — Progress Notes (Signed)
 [facility] [address] [place], [place] [address]  Internal MEDICINE  Office Visit Note  Patient Name: [name]  [id number]  [id number]  Date of Service: 08/29/2024  Chief Complaint  Patient presents with   Hyperlipidemia   Hypertension   Medicare Wellness    HPI [name] presents for a medicare annual wellness visit.  Well-appearing 78 y.o. female with AFIB, COPD, CHF, chronic respiratory failure, oxygen  dependent, IBS, osteoarthritis, hypothyroidism, anemia, GAD, and depression  Routine CRC screening: discontinued, aged out.  Labs: up to date for now.  New or worsening pain: chronic pain  Other concerns: Homebound -- needs assistance at home and has no family support. Unable to take care of most of her ADLs without assistance. She is not able to cook meals or pick up her medications. She is on continuous oxygen  and has difficulty walking. She has chronic respiratory failure with hypoxia, COPD and CHF. She is in need of home health nurse, home health aide, possibly meals on wheels and prescription medication delivery.       08/29/2024    1:42 PM 08/26/2023    2:40 PM 08/20/2022    2:41 PM  MMSE - Mini Mental State Exam  Orientation to time 5 5 5   Orientation to Place 5 5 5   Registration 3 3 3   Attention/ Calculation 5 5 5   Recall 3 3 3   Language- name 2 objects 2 2 2   Language- repeat 1 1 0  Language- follow 3 step command 3 3 2   Language- read & follow direction 1 1 1   Write a sentence 0 0 0  Copy design 1 1 1   Total score 29 29 27     Functional Status Survey: Is the patient deaf or have difficulty hearing?: Yes Does the patient have difficulty seeing, even when wearing glasses/contacts?: Yes Does the patient have difficulty concentrating, remembering, or making decisions?: Yes Does the patient have difficulty walking or climbing stairs?: Yes Does the patient have difficulty dressing or bathing?: Yes Does the patient have difficulty doing  errands alone such as visiting a doctor's office or shopping?: Yes     10/14/2023   11:01 AM 11/02/2023    2:28 PM 04/04/2024   10:12 AM 06/13/2024   11:20 AM 08/29/2024    1:40 PM  Fall Risk  Falls in the past year? 1 1 0 1 1  Was there an injury with Fall? 0  0   0  0  Fall Risk Category Calculator 2 1  1 2   Fall risk Follow up     Falls evaluation completed     Data saved with a previous flowsheet row definition       06/13/2024   11:20 AM  Depression screen PHQ 2/9  Decreased Interest 0  Down, Depressed, Hopeless 0  PHQ - 2 Score 0       05/05/2022    2:11 PM 04/06/2022    1:53 PM 10/27/2021   11:43 AM 05/22/2021    1:50 PM  GAD 7 : Generalized Anxiety Score  Nervous, Anxious, on Edge 0 0 0 1  Control/stop worrying 0 0 0 1  Worry too much - different things 0 0 0 1  Trouble relaxing 0 0 0 1  Restless 0 0 0 1  Easily annoyed or irritable 0 0 0 1  Afraid - awful might happen 0 0 0 1  Total GAD 7 Score 0 0 0 7  Anxiety Difficulty Not difficult at all  Not difficult at all Not difficult at all Somewhat difficult      Current Medication: Outpatient Encounter Medications as of 08/29/2024  Medication Sig   ALPRAZolam  (XANAX ) 0.25 MG tablet Take 1 tablet (0.25 mg total) by mouth 2 (two) times daily as needed for anxiety.   atorvastatin  (LIPITOR) 40 MG tablet Take 1 tablet (40 mg total) by mouth at bedtime.   Calcium  Carbonate-Vitamin D  (CALCIUM  600+D PO) Take 1 tablet by mouth daily.   carvedilol  (COREG ) 25 MG tablet Take 25 mg by mouth 2 (two) times daily with a meal.   Cholecalciferol  (VITAMIN D3) 10 MCG (400 UNIT) CAPS Take by mouth daily.   Coenzyme Q10 (COQ10) 200 MG CAPS Take 200 mg by mouth daily.   diclofenac  Sodium (VOLTAREN ) 1 % GEL Apply 2 g topically 4 (four) times daily.   gabapentin  (NEURONTIN ) 100 MG capsule Take 1 capsule (100 mg total) by mouth at bedtime.   lisinopril  (ZESTRIL ) 40 MG tablet Take 1 tablet (40 mg total) by mouth daily.   loratadine   (CLARITIN ) 10 MG tablet Take 10 mg by mouth daily.   oxyCODONE -acetaminophen  (PERCOCET) 10-325 MG tablet Take 1 tablet by mouth 2 (two) times daily as needed for pain.   [START ON 09/26/2024] oxyCODONE -acetaminophen  (PERCOCET) 10-325 MG tablet Take 1 tablet by mouth 2 (two) times daily as needed for pain.   [START ON 10/24/2024] oxyCODONE -acetaminophen  (PERCOCET) 10-325 MG tablet Take 1 tablet by mouth 2 (two) times daily as needed for pain.   sertraline  (ZOLOFT ) 25 MG tablet Take 1 tablet by mouth daily for 1 week then increase to 2 tablets by mouth daily.   sodium chloride  (OCEAN) 0.65 % SOLN nasal spray Place 1 spray into both nostrils as needed for congestion.   traZODone  (DESYREL ) 50 MG tablet Take 1 tablet (50 mg total) by mouth at bedtime.   [DISCONTINUED] albuterol  (VENTOLIN  HFA) 108 (90 Base) MCG/ACT inhaler Inhale 2 puffs into the lungs every 6 (six) hours as needed for wheezing or shortness of breath.   [DISCONTINUED] ALPRAZolam  (XANAX ) 0.25 MG tablet Take 1 tablet (0.25 mg total) by mouth 2 (two) times daily as needed for anxiety.   [DISCONTINUED] atorvastatin  (LIPITOR) 40 MG tablet TAKE 1 TABLET BY MOUTH EVERY NIGHT AT BEDTIME   [DISCONTINUED] BREZTRI  AEROSPHERE 160-9-4.8 MCG/ACT AERO INHALE 2 PUFFS INTO THE LUNGS TWICE DAILY   [DISCONTINUED] bumetanide  (BUMEX ) 1 MG tablet TAKE 2 TABLET BY MOUTH TWICE DAILY, ADDITIONAL 2 TABLETS IN THE EVENING AS NEEDED   [DISCONTINUED] ELIQUIS  5 MG TABS tablet TAKE 1 TABLET BY MOUTH TWICE DAILY   [DISCONTINUED] gabapentin  (NEURONTIN ) 100 MG capsule Take 1 capsule (100 mg total) by mouth at bedtime.   [DISCONTINUED] JARDIANCE  10 MG TABS tablet TAKE 1 TABLET(10 MG) BY MOUTH DAILY BEFORE BREAKFAST   [DISCONTINUED] levothyroxine  (SYNTHROID ) 25 MCG tablet TAKE 1 TABLET BY MOUTH DAILY BEFORE BREAKFAST   [DISCONTINUED] lisinopril  (ZESTRIL ) 40 MG tablet TAKE 1 TABLET(40 MG) BY MOUTH DAILY   [DISCONTINUED] oxyCODONE -acetaminophen  (PERCOCET) 10-325 MG tablet Take  1 tablet by mouth 2 (two) times daily as needed for pain.   [DISCONTINUED] pantoprazole  (PROTONIX ) 40 MG tablet TAKE 1 TABLET BY MOUTH EVERY DAY   [DISCONTINUED] pneumococcal 20-valent conjugate vaccine (PREVNAR 20) 0.5 ML injection Inject 0.5 mLs into the muscle once as needed for up to 1 dose for immunization.   [DISCONTINUED] potassium chloride  (KLOR-CON ) 10 MEQ tablet TAKE 4 TABLETS BY MOUTH TWICE DAILY   [DISCONTINUED] sertraline  (ZOLOFT ) 25 MG tablet Take 1 tablet by  mouth daily for 1 week then increase to 2 tablets by mouth daily.   [DISCONTINUED] traZODone  (DESYREL ) 50 MG tablet TAKE 1 TABLET BY MOUTH EVERY DAY AT BEDTIME   [DISCONTINUED] TURMERIC PO Take by mouth daily at 6 (six) AM.   No facility-administered encounter medications on file as of 08/29/2024.    Surgical History: Past Surgical History:  Procedure Laterality Date   ABDOMINAL AORTIC ANEURYSM REPAIR  2008   ABDOMINAL HYSTERECTOMY     AORTIC VALVE REPLACEMENT  2008   CARDIAC CATHETERIZATION     [facility]   CARDIOVERSION N/A 08/15/2018   Procedure: CARDIOVERSION;  Surgeon: Darron Deatrice LABOR, MD;  Location: [facility] ORS;  Service: Cardiovascular;  Laterality: N/A;   CARDIOVERSION N/A 11/14/2018   Procedure: CARDIOVERSION (CATH LAB);  Surgeon: Darron Deatrice LABOR, MD;  Location: [facility] ORS;  Service: Cardiovascular;  Laterality: N/A;   CARDIOVERSION N/A 06/05/2019   Procedure: CARDIOVERSION;  Surgeon: Darron Deatrice LABOR, MD;  Location: [facility] ORS;  Service: Cardiovascular;  Laterality: N/A;   CARDIOVERSION N/A 08/14/2019   Procedure: CARDIOVERSION;  Surgeon: Darron Deatrice LABOR, MD;  Location: [facility] ORS;  Service: Cardiovascular;  Laterality: N/A;   CARDIOVERSION N/A 11/09/2019   Procedure: CARDIOVERSION;  Surgeon: Ammon Blunt, MD;  Location: [facility] ORS;  Service: Cardiovascular;  Laterality: N/A;   CARDIOVERSION N/A 01/17/2020   Procedure: CARDIOVERSION;  Surgeon: Ammon Blunt, MD;  Location: [facility] ORS;  Service: Cardiovascular;   Laterality: N/A;   CARPAL TUNNEL RELEASE     ELECTROPHYSIOLOGIC STUDY N/A 08/17/2016   Procedure: Cardioversion;  Surgeon: Deatrice LABOR Darron, MD;  Location: [facility] ORS;  Service: Cardiovascular;  Laterality: N/A;   TUMOR EXCISION Left    x3 (arm)    Medical History: Past Medical History:  Diagnosis Date   Aortic 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 s/p bioprosthetic valve replacement 2008 at [facility];  b. 01/2015 Echo: EF 60-65%, no rwma, Gr1 DD, mildly dil LA, nl RV fxn.   Aspiration pneumonia ([name])    Atrial fibrillation with RVR ([name]) 01/11/2015   Atrial flutter ([name])    a. 08/2016 s/p DCCV.  Remains on flecainide  50 mg bid.   Basal skull fracture ([name]) 20 yrs ago   CHF (congestive heart failure) ([name])    Chronic respiratory failure ([name])    COPD (chronic obstructive pulmonary disease) ([name])    a. 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 2008 prior to Aortic aneurysm repair-->nl cors.   History of stress test    a. 10/2015 MV: no ischemia/infarct.   HLD (hyperlipidemia)    HTN (hypertension)    Hypothyroidism    Obesity    PAF (paroxysmal atrial fibrillation) ([name])    a. on Eliquis ; b. CHADS2VASc = 3 (HTN, age x 1, female).   Paroxysmal atrial fibrillation ([name]) 01/24/2015   Right upper quadrant abdominal tenderness without rebound tenderness 03/02/2018   S/P ascending aortic aneurysm repair 2008    Family History: Family History  Problem Relation Age of Onset   Stroke Father    Stroke Paternal Grandmother    Breast cancer Neg Hx     Social History   Socioeconomic History   Marital status: Divorced    Spouse name: Not on file   Number of children: Not on file   Years of education: Not on file  Highest education level: Not on file  Occupational History   Not on file  Tobacco Use   Smoking status: Former    Types: Cigarettes   Smokeless  tobacco: Never  Vaping Use   Vaping status: Never Used  Substance and Sexual Activity   Alcohol use: No   Drug use: No   Sexual activity: Not Currently    Birth control/protection: Surgical  Other Topics Concern   Not on file  Social History Narrative   Not on file   Social Drivers of Health   Tobacco Use: Medium Risk (08/29/2024)   Patient History    Smoking Tobacco Use: Former    Smokeless Tobacco Use: Never    Passive Exposure: Not on file  Financial Resource Strain: Low Risk  (11/03/2022)   Received from [facility]   Overall Financial Resource Strain (CARDIA)    Difficulty of Paying Living Expenses: Not hard at all  Recent Concern: Financial Resource Strain - Medium Risk (11/02/2022)   Received from [facility]   Overall Financial Resource Strain (CARDIA)    Difficulty of Paying Living Expenses: Somewhat hard  Food Insecurity: Food Insecurity Present (04/02/2023)   Hunger Vital Sign    Worried About Running Out of Food in the Last Year: Sometimes true    Ran Out of Food in the Last Year: Sometimes true  Transportation Needs: No Transportation Needs (02/02/2023)   PRAPARE - Administrator, Civil Service (Medical): No    Lack of Transportation (Non-Medical): No  Physical Activity: Not on file  Stress: Not on file  Social Connections: Not on file  Intimate Partner Violence: Not At Risk (04/02/2023)   Humiliation, Afraid, Rape, and Kick questionnaire    Fear of Current or Ex-Partner: No    Emotionally Abused: No    Physically Abused: No    Sexually Abused: No  Depression (PHQ2-9): Low Risk (06/13/2024)   Depression (PHQ2-9)    PHQ-2 Score: 0  Alcohol Screen: Low Risk (03/17/2022)   Alcohol Screen    Last Alcohol Screening Score (AUDIT): 0  Housing: Unknown (10/13/2023)   [facility]   Epic    In the last 12 months, was there a time when you were not able to pay the mortgage or rent on time?:  No    Number of Times Moved in the Last Year: Not on file    At any time in the past 12 months, were you homeless or living in a shelter (including now)?: No  Utilities: At Risk (04/02/2023)   [facility] Utilities    Threatened with loss of utilities: Yes  Health Literacy: Not on file      Review of Systems  Constitutional:  Negative for activity change, appetite change, chills, fatigue, fever and unexpected weight change.  HENT: Negative.  Negative for congestion, ear pain, rhinorrhea, sore throat and trouble swallowing.   Eyes: Negative.   Respiratory:  Positive for shortness of breath (intermittent). Negative for cough, chest tightness and wheezing.   Cardiovascular: Negative.  Negative for chest pain and palpitations.  Gastrointestinal: Negative.  Negative for abdominal pain, blood in stool, constipation, diarrhea, nausea and vomiting.  Endocrine: Negative.   Genitourinary: Negative.  Negative for difficulty urinating, dysuria, frequency, hematuria and urgency.  Musculoskeletal: Negative.  Negative for arthralgias, back pain, joint swelling, myalgias and neck pain.  Skin: Negative.  Negative for rash and wound.  Allergic/Immunologic: Negative.  Negative for immunocompromised state.  Neurological:  Negative for dizziness, seizures, numbness and headaches.  Hematological: Negative.   Psychiatric/Behavioral: Negative.  Negative for behavioral problems, self-injury and suicidal ideas. The patient is not nervous/anxious.     Vital Signs: BP 98/62   Pulse 75   Temp (!) 96.8 F (36 C)   Resp 16   Ht 4' 10 (1.473 m)   Wt 198 lb 6.4 oz (90 kg)   SpO2 93% Comment: 5L  BMI 41.47 kg/m    Physical Exam Vitals reviewed.  Constitutional:      General: She is not in acute distress.    Appearance: Normal appearance. She is obese. She is not ill-appearing.     Interventions: Nasal cannula in place.  HENT:     Head: Normocephalic and atraumatic.     Right Ear: Tympanic membrane, ear canal  and external ear normal.     Left Ear: Tympanic membrane, ear canal and external ear normal.     Nose: Nose normal. No congestion or rhinorrhea.     Mouth/Throat:     Mouth: Mucous membranes are moist.     Pharynx: Oropharynx is clear. No oropharyngeal exudate or posterior oropharyngeal erythema.  Eyes:     Extraocular Movements: Extraocular movements intact.     Conjunctiva/sclera: Conjunctivae normal.     Pupils: Pupils are equal, round, and reactive to light.  Cardiovascular:     Rate and Rhythm: Normal rate and regular rhythm.     Pulses: Normal pulses.     Heart sounds: Normal heart sounds. No murmur heard.    No gallop.  Pulmonary:     Effort: Pulmonary effort is normal. No respiratory distress.     Breath sounds: Normal breath sounds. No wheezing.  Abdominal:     General: Bowel sounds are normal. There is no distension.     Palpations: Abdomen is soft. There is no mass.     Tenderness: There is no abdominal tenderness. There is no guarding or rebound.     Hernia: No hernia is present.  Musculoskeletal:     Cervical back: Normal range of motion.  Lymphadenopathy:     Cervical: No cervical adenopathy.  Skin:    General: Skin is warm and dry.     Capillary Refill: Capillary refill takes less than 2 seconds.  Neurological:     Mental Status: She is alert and oriented to person, place, and time.     Cranial Nerves: No cranial nerve deficit.     Coordination: Coordination normal.     Gait: Gait normal.  Psychiatric:        Mood and Affect: Mood normal.        Behavior: Behavior normal.        Thought Content: Thought content normal.        Judgment: Judgment normal.        Assessment/Plan: 1. Encounter for Medicare annual examination with abnormal findings (Primary) Age-appropriate preventive screenings and vaccinations discussed. Routine labs for health maintenance are up to date. PHM updated.  Referred to home health for further assistance. Referral sent for meals  on wheels as well.  - traZODone  (DESYREL ) 50 MG tablet; Take 1 tablet (50 mg total) by mouth at bedtime.  Dispense: 90 tablet; Refill: 1 - gabapentin  (NEURONTIN ) 100 MG capsule; Take 1 capsule (100 mg total) by mouth at bedtime.  Dispense: 90 capsule; Refill: 1 - atorvastatin  (LIPITOR) 40 MG tablet; Take 1 tablet (40 mg total) by mouth at bedtime.  Dispense: 90 tablet; Refill: 1  2. Chronic respiratory failure with hypoxia ([name]) Continue using supplemental oxygen  as ordered at 5 LPM. Continue medications as prescribed. Referred to home health for further assistance. Referral sent for meals on wheels as well.  - Ambulatory referral to Home Health  3. CHF (congestive heart failure), NYHA class IV, chronic, diastolic ([name]) Continue to follow up with cardiology. Referred to home health for further assistance. Referral sent for meals on wheels as well.  - Ambulatory referral to Home Health  4. Primary hypertension Stable, continue medications as prescribed. Referred to home health for further assistance. Referral sent for meals on wheels as well.  - lisinopril  (ZESTRIL ) 40 MG tablet; Take 1 tablet (40 mg total) by mouth daily.  Dispense: 90 tablet; Refill: 1 - Ambulatory referral to Home Health  5. Declining functional status Referred to home health for further assistance. Referral sent for meals on wheels as well.  - Ambulatory referral to Home Health  6. Generalized weakness Referred to home health for further assistance. Referral sent for meals on wheels as well.  - Ambulatory referral to Home Health  7. Chronic bilateral low back pain without sciatica Referred to home health for further assistance. Referral sent for meals on wheels as well.  - Ambulatory referral to Home Health  8. Chronic pain of both shoulders Continue prn oxycodone  as prescribed. Referred to home health for further assistance. Referral sent for meals on wheels as well.  - oxyCODONE -acetaminophen  (PERCOCET) 10-325 MG  tablet; Take 1 tablet by mouth 2 (two) times daily as needed for pain.  Dispense: 60 tablet; Refill: 0 - oxyCODONE -acetaminophen  (PERCOCET) 10-325 MG tablet; Take 1 tablet by mouth 2 (two) times daily as needed for pain.  Dispense: 60 tablet; Refill: 0 - oxyCODONE -acetaminophen  (PERCOCET) 10-325 MG tablet; Take 1 tablet by mouth 2 (two) times daily as needed for pain.  Dispense: 60 tablet; Refill: 0 - Ambulatory referral to Home Health  9. Need for assistance at home and no other household member able to render care Referred to home health for further assistance. Referral sent for meals on wheels as well.  - Ambulatory referral to Home Health  10. Self-care deficit Referred to home health for further assistance. Referral sent for meals on wheels as well.  - Ambulatory referral to Home Health  11. Moderate episode of recurrent major depressive disorder ([name]) Continue sertraline  as prescribed. Referred to home health.  - sertraline  (ZOLOFT ) 25 MG tablet; Take 1 tablet by mouth daily for 1 week then increase to 2 tablets by mouth daily.  Dispense: 30 tablet; Refill: 3 - Ambulatory referral to Home Health  12. GAD (generalized anxiety disorder) Continue sertraline  and prn alprazolam  as prescribed. Referred to home health.  - ALPRAZolam  (XANAX ) 0.25 MG tablet; Take 1 tablet (0.25 mg total) by mouth 2 (two) times daily as needed for anxiety.  Dispense: 60 tablet; Refill: 2 - sertraline  (ZOLOFT ) 25 MG tablet; Take 1 tablet by mouth daily for 1 week then increase to 2 tablets by mouth daily.  Dispense: 30 tablet; Refill: 3 - Ambulatory referral to Home Health     General Counseling: [name] verbalizes understanding of the findings of todays visit and agrees with plan of treatment. I have discussed any further diagnostic evaluation that may be needed or ordered today. We also reviewed her medications today. she has been encouraged to call the office with any questions or concerns that should arise  related to todays visit.    Orders Placed This Encounter  Procedures  Ambulatory referral to Home Health    Meds ordered this encounter  Medications   ALPRAZolam  (XANAX ) 0.25 MG tablet    Sig: Take 1 tablet (0.25 mg total) by mouth 2 (two) times daily as needed for anxiety.    Dispense:  60 tablet    Refill:  2    For future refills   oxyCODONE -acetaminophen  (PERCOCET) 10-325 MG tablet    Sig: Take 1 tablet by mouth 2 (two) times daily as needed for pain.    Dispense:  60 tablet    Refill:  0    Fill for today please, patient is due now   oxyCODONE -acetaminophen  (PERCOCET) 10-325 MG tablet    Sig: Take 1 tablet by mouth 2 (two) times daily as needed for pain.    Dispense:  60 tablet    Refill:  0    Fil for January   oxyCODONE -acetaminophen  (PERCOCET) 10-325 MG tablet    Sig: Take 1 tablet by mouth 2 (two) times daily as needed for pain.    Dispense:  60 tablet    Refill:  0    Fill for February.   sertraline  (ZOLOFT ) 25 MG tablet    Sig: Take 1 tablet by mouth daily for 1 week then increase to 2 tablets by mouth daily.    Dispense:  30 tablet    Refill:  3   traZODone  (DESYREL ) 50 MG tablet    Sig: Take 1 tablet (50 mg total) by mouth at bedtime.    Dispense:  90 tablet    Refill:  1   lisinopril  (ZESTRIL ) 40 MG tablet    Sig: Take 1 tablet (40 mg total) by mouth daily.    Dispense:  90 tablet    Refill:  1   gabapentin  (NEURONTIN ) 100 MG capsule    Sig: Take 1 capsule (100 mg total) by mouth at bedtime.    Dispense:  90 capsule    Refill:  1   atorvastatin  (LIPITOR) 40 MG tablet    Sig: Take 1 tablet (40 mg total) by mouth at bedtime.    Dispense:  90 tablet    Refill:  1    Return for future visits ok to do virtually since patient is homebound. .   Total time spent:30 Minutes Time spent includes review of chart, medications, test results, and follow up plan with the patient.   [place] Controlled Substance Database was reviewed by me.  This patient was seen  by Mardy Maxin, FNP-C in collaboration with Dr. Sigrid Bathe as a part of collaborative care agreement.  Santina Trillo R. Maxin, MSN, FNP-C Internal medicine

## 2024-09-04 ENCOUNTER — Other Ambulatory Visit: Payer: Self-pay | Admitting: Internal Medicine

## 2024-09-04 DIAGNOSIS — J449 Chronic obstructive pulmonary disease, unspecified: Secondary | ICD-10-CM

## 2024-09-05 ENCOUNTER — Telehealth: Payer: Self-pay

## 2024-09-05 ENCOUNTER — Telehealth: Admitting: Internal Medicine

## 2024-09-05 ENCOUNTER — Encounter: Payer: Self-pay | Admitting: Nurse Practitioner

## 2024-09-05 VITALS — Resp 16 | Ht <= 58 in | Wt 193.0 lb

## 2024-09-05 DIAGNOSIS — R531 Weakness: Secondary | ICD-10-CM | POA: Insufficient documentation

## 2024-09-05 DIAGNOSIS — Z789 Other specified health status: Secondary | ICD-10-CM | POA: Diagnosis not present

## 2024-09-05 DIAGNOSIS — Z7409 Other reduced mobility: Secondary | ICD-10-CM

## 2024-09-05 DIAGNOSIS — Z742 Need for assistance at home and no other household member able to render care: Secondary | ICD-10-CM | POA: Insufficient documentation

## 2024-09-05 DIAGNOSIS — J9611 Chronic respiratory failure with hypoxia: Secondary | ICD-10-CM | POA: Diagnosis not present

## 2024-09-05 DIAGNOSIS — R5381 Other malaise: Secondary | ICD-10-CM | POA: Insufficient documentation

## 2024-09-05 DIAGNOSIS — M545 Low back pain, unspecified: Secondary | ICD-10-CM | POA: Insufficient documentation

## 2024-09-05 NOTE — Telephone Encounter (Signed)
 Sent message to [facility]

## 2024-09-05 NOTE — Telephone Encounter (Signed)
 [facility] sent  message that they  accepts [name] with a start of care for Friday.

## 2024-09-05 NOTE — Progress Notes (Signed)
 [facility] [address] [place], [place] [address]  Internal MEDICINE  Telephone Visit  Patient Name: [name]  [id number]  [id number]  Date of Service: 09/05/2024  I connected with the patient at 1030 by telephone and verified the patients identity using two identifiers.   I discussed the limitations, risks, security and privacy concerns of performing an evaluation and management service by telephone and the availability of in person appointments. I also discussed with the patient that there may be a patient responsible charge related to the service.  The patient expressed understanding and agrees to proceed.    Chief Complaint  Patient presents with   Telephone Assessment    [phone]    Telephone Screen    Home health and aide     HPI  Pt is homebound due to multiple medical problems  Chronic respiratory failure with hypoxia, unable to take care of ADL's  Difficulty walking, arm and shoulder pain, unable to cook, unable to pick her medications  No family support     Current Medication: Outpatient Encounter Medications as of 09/05/2024  Medication Sig   ALPRAZolam  (XANAX ) 0.25 MG tablet Take 1 tablet (0.25 mg total) by mouth 2 (two) times daily as needed for anxiety.   atorvastatin  (LIPITOR) 40 MG tablet Take 1 tablet (40 mg total) by mouth at bedtime.   BREZTRI  AEROSPHERE 160-9-4.8 MCG/ACT AERO inhaler INHALE 2 PUFFS INTO THE LUNGS TWICE DAILY   bumetanide  (BUMEX ) 1 MG tablet TAKE 2 TABLETS BY MOUTH TWICE DAILY, ADDITIONAL 2 TABLETS IN THE EVENING AS NEEDED.   Calcium  Carbonate-Vitamin D  (CALCIUM  600+D PO) Take 1 tablet by mouth daily.   carvedilol  (COREG ) 25 MG tablet Take 25 mg by mouth 2 (two) times daily with a meal.   Cholecalciferol  (VITAMIN D3) 10 MCG (400 UNIT) CAPS Take by mouth daily.   Coenzyme Q10 (COQ10) 200 MG CAPS Take 200 mg by mouth daily.   diclofenac  Sodium (VOLTAREN ) 1 % GEL Apply 2 g topically 4 (four) times daily.   ELIQUIS  5 MG  TABS tablet TAKE 1 TABLET BY MOUTH TWICE DAILY   gabapentin  (NEURONTIN ) 100 MG capsule Take 1 capsule (100 mg total) by mouth at bedtime.   JARDIANCE  10 MG TABS tablet TAKE 1 TABLET(10 MG) BY MOUTH DAILY   levothyroxine  (SYNTHROID ) 50 MCG tablet Take 1 tablet (50 mcg total) by mouth daily.   lisinopril  (ZESTRIL ) 40 MG tablet Take 1 tablet (40 mg total) by mouth daily.   loratadine  (CLARITIN ) 10 MG tablet Take 10 mg by mouth daily.   oxyCODONE -acetaminophen  (PERCOCET) 10-325 MG tablet Take 1 tablet by mouth 2 (two) times daily as needed for pain.   [START ON 09/26/2024] oxyCODONE -acetaminophen  (PERCOCET) 10-325 MG tablet Take 1 tablet by mouth 2 (two) times daily as needed for pain.   [START ON 10/24/2024] oxyCODONE -acetaminophen  (PERCOCET) 10-325 MG tablet Take 1 tablet by mouth 2 (two) times daily as needed for pain.   pantoprazole  (PROTONIX ) 40 MG tablet Take 1 tablet (40 mg total) by mouth daily.   potassium chloride  (KLOR-CON ) 10 MEQ tablet TAKE 4 TABLETS BY MOUTH TWICE DAILY   sertraline  (ZOLOFT ) 25 MG tablet Take 1 tablet by mouth daily for 1 week then increase to 2 tablets by mouth daily.   sodium chloride  (OCEAN) 0.65 % SOLN nasal spray Place 1 spray into both nostrils as needed for congestion.   traZODone  (DESYREL ) 50 MG tablet Take 1 tablet (50 mg total) by mouth at bedtime.   VENTOLIN  HFA  108 (90 Base) MCG/ACT inhaler INHALE 2 PUFFS INTO THE LUNGS EVERY 6 HOURS AS NEEDED FOR WHEEZING OR SHORTNESS OF BREATH   No facility-administered encounter medications on file as of 09/05/2024.    Surgical History: Past Surgical History:  Procedure Laterality Date   ABDOMINAL AORTIC ANEURYSM REPAIR  2008   ABDOMINAL HYSTERECTOMY     AORTIC VALVE REPLACEMENT  2008   CARDIAC CATHETERIZATION     [facility]   CARDIOVERSION N/A 08/15/2018   Procedure: CARDIOVERSION;  Surgeon: Darron Deatrice LABOR, MD;  Location: [facility] ORS;  Service: Cardiovascular;  Laterality: N/A;   CARDIOVERSION N/A 11/14/2018   Procedure:  CARDIOVERSION (CATH LAB);  Surgeon: Darron Deatrice LABOR, MD;  Location: [facility] ORS;  Service: Cardiovascular;  Laterality: N/A;   CARDIOVERSION N/A 06/05/2019   Procedure: CARDIOVERSION;  Surgeon: Darron Deatrice LABOR, MD;  Location: [facility] ORS;  Service: Cardiovascular;  Laterality: N/A;   CARDIOVERSION N/A 08/14/2019   Procedure: CARDIOVERSION;  Surgeon: Darron Deatrice LABOR, MD;  Location: [facility] ORS;  Service: Cardiovascular;  Laterality: N/A;   CARDIOVERSION N/A 11/09/2019   Procedure: CARDIOVERSION;  Surgeon: Ammon Blunt, MD;  Location: [facility] ORS;  Service: Cardiovascular;  Laterality: N/A;   CARDIOVERSION N/A 01/17/2020   Procedure: CARDIOVERSION;  Surgeon: Ammon Blunt, MD;  Location: [facility] ORS;  Service: Cardiovascular;  Laterality: N/A;   CARPAL TUNNEL RELEASE     ELECTROPHYSIOLOGIC STUDY N/A 08/17/2016   Procedure: Cardioversion;  Surgeon: Deatrice LABOR Darron, MD;  Location: [facility] ORS;  Service: Cardiovascular;  Laterality: N/A;   TUMOR EXCISION Left    x3 (arm)    Medical History: Past Medical History:  Diagnosis Date   Aortic 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 s/p bioprosthetic valve replacement 2008 at [facility];  b. 01/2015 Echo: EF 60-65%, no rwma, Gr1 DD, mildly dil LA, nl RV fxn.   Aspiration pneumonia ([name])    Atrial fibrillation with RVR ([name]) 01/11/2015   Atrial flutter ([name])    a. 08/2016 s/p DCCV.  Remains on flecainide  50 mg bid.   Basal skull fracture ([name]) 20 yrs ago   CHF (congestive heart failure) ([name])    Chronic respiratory failure ([name])    COPD (chronic obstructive pulmonary disease) ([name])    a. 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 2008 prior to Aortic aneurysm repair-->nl cors.   History of stress test    a. 10/2015 MV: no ischemia/infarct.   HLD (hyperlipidemia)    HTN (hypertension)    Hypothyroidism    Obesity    PAF (paroxysmal  atrial fibrillation) ([name])    a. on Eliquis ; b. CHADS2VASc = 3 (HTN, age x 1, female).   Paroxysmal atrial fibrillation ([name]) 01/24/2015   Right upper quadrant abdominal tenderness without rebound tenderness 03/02/2018   S/P ascending aortic aneurysm repair 2008    Family History: Family History  Problem Relation Age of Onset   Stroke Father    Stroke Paternal Grandmother    Breast cancer Neg Hx     Social History   Socioeconomic History   Marital status: Divorced    Spouse name: Not on file   Number of children: Not on file   Years of education: Not on file   Highest education level: Not on file  Occupational History   Not on file  Tobacco Use  Smoking status: Former    Types: Cigarettes   Smokeless tobacco: Never  Vaping Use   Vaping status: Never Used  Substance and Sexual Activity   Alcohol use: No   Drug use: No   Sexual activity: Not Currently    Birth control/protection: Surgical  Other Topics Concern   Not on file  Social History Narrative   Not on file   Social Drivers of Health   Tobacco Use: Medium Risk (08/29/2024)   Patient History    Smoking Tobacco Use: Former    Smokeless Tobacco Use: Never    Passive Exposure: Not on file  Financial Resource Strain: Low Risk  (11/03/2022)   Received from [facility]   Overall Financial Resource Strain (CARDIA)    Difficulty of Paying Living Expenses: Not hard at all  Recent Concern: Financial Resource Strain - Medium Risk (11/02/2022)   Received from [facility]   Overall Financial Resource Strain (CARDIA)    Difficulty of Paying Living Expenses: Somewhat hard  Food Insecurity: Food Insecurity Present (04/02/2023)   Hunger Vital Sign    Worried About Running Out of Food in the Last Year: Sometimes true    Ran Out of Food in the Last Year: Sometimes true  Transportation Needs: No Transportation Needs (02/02/2023)   PRAPARE - Administrator, Civil Service  (Medical): No    Lack of Transportation (Non-Medical): No  Physical Activity: Not on file  Stress: Not on file  Social Connections: Not on file  Intimate Partner Violence: Not At Risk (04/02/2023)   Humiliation, Afraid, Rape, and Kick questionnaire    Fear of Current or Ex-Partner: No    Emotionally Abused: No    Physically Abused: No    Sexually Abused: No  Depression (PHQ2-9): Low Risk (06/13/2024)   Depression (PHQ2-9)    PHQ-2 Score: 0  Alcohol Screen: Low Risk (03/17/2022)   Alcohol Screen    Last Alcohol Screening Score (AUDIT): 0  Housing: Unknown (10/13/2023)   Received from [facility]   Epic    In the last 12 months, was there a time when you were not able to pay the mortgage or rent on time?: No    Number of Times Moved in the Last Year: Not on file    At any time in the past 12 months, were you homeless or living in a shelter (including now)?: No  Utilities: At Risk (04/02/2023)   [facility] Utilities    Threatened with loss of utilities: Yes  Health Literacy: Not on file      Review of Systems  Constitutional:  Positive for fatigue. Negative for fever.  HENT:  Negative for congestion, mouth sores and postnasal drip.   Respiratory:  Negative for cough.   Cardiovascular:  Negative for chest pain.  Genitourinary:  Negative for flank pain.  Musculoskeletal:  Positive for arthralgias, gait problem and neck stiffness.  Neurological:  Positive for weakness.  Psychiatric/Behavioral: Negative.      Vital Signs: Resp 16   Ht 4' 10 (1.473 m)   Wt 193 lb (87.5 kg)   BMI 40.34 kg/m    Observation/Objective: Mild resp distress, wearing oxygen     Assessment/Plan: 1. Chronic respiratory failure with hypoxia ([name]) (Primary) Patient is instructed to continue to wear her oxygen  at all times and follow-up with pulmonary if there is a problem  2. Weakness generalized Patient has a generalized weakness due to chronic medical diagnoses multiple medical problems  will  get benefit from having an aide at home to take care of some of the ADLs this will ease her burden.patient is also homebound  3. Impaired mobility and ADLs This is multifactorial, musculoskeleta, l cardiopulmonary and chronic pain  General Counseling: Karlin verbalizes understanding of the findings of today's phone visit and agrees with plan of treatment. I have discussed any further diagnostic evaluation that may be needed or ordered today. We also reviewed her medications today. she has been encouraged to call the office with any questions or concerns that should arise related to todays visit.  I highly recommend to have him assessed at home with home health aide for any other resources she could have      Time spent:25 Minutes    Dr Avaeh Ewer M Ronnett Pullin Internal medicine

## 2024-09-06 ENCOUNTER — Encounter: Payer: Self-pay | Admitting: Nurse Practitioner

## 2024-09-11 ENCOUNTER — Telehealth: Payer: Self-pay

## 2024-09-11 NOTE — Telephone Encounter (Signed)
 Gave verbal order [facility] [phone] for nursing 1 times a week for 3 weeks and every other weeks for 6 weeks and also home health aide once a week for 8 weeks

## 2024-09-12 ENCOUNTER — Encounter: Payer: Self-pay | Admitting: *Deleted

## 2024-09-12 DIAGNOSIS — R110 Nausea: Secondary | ICD-10-CM

## 2024-09-12 DIAGNOSIS — R1013 Epigastric pain: Secondary | ICD-10-CM

## 2024-09-15 ENCOUNTER — Other Ambulatory Visit: Payer: Self-pay | Admitting: Nurse Practitioner

## 2024-09-15 DIAGNOSIS — K219 Gastro-esophageal reflux disease without esophagitis: Secondary | ICD-10-CM

## 2024-09-18 ENCOUNTER — Other Ambulatory Visit: Payer: Self-pay | Admitting: *Deleted

## 2024-09-18 DIAGNOSIS — R110 Nausea: Secondary | ICD-10-CM

## 2024-09-18 DIAGNOSIS — R1013 Epigastric pain: Secondary | ICD-10-CM

## 2024-09-20 ENCOUNTER — Ambulatory Visit

## 2024-09-21 ENCOUNTER — Other Ambulatory Visit: Payer: Self-pay

## 2024-09-21 ENCOUNTER — Other Ambulatory Visit: Payer: Self-pay | Admitting: Nurse Practitioner

## 2024-09-21 DIAGNOSIS — E038 Other specified hypothyroidism: Secondary | ICD-10-CM

## 2024-09-22 ENCOUNTER — Other Ambulatory Visit: Payer: Self-pay | Admitting: Nurse Practitioner

## 2024-09-22 DIAGNOSIS — Z0001 Encounter for general adult medical examination with abnormal findings: Secondary | ICD-10-CM

## 2024-09-25 ENCOUNTER — Ambulatory Visit
Admission: RE | Admit: 2024-09-25 | Discharge: 2024-09-25 | Disposition: A | Discharge status: Home / Self Care | Source: Ambulatory Visit | Attending: *Deleted | Admitting: *Deleted

## 2024-09-25 ENCOUNTER — Other Ambulatory Visit: Payer: Self-pay | Admitting: Nurse Practitioner

## 2024-09-25 DIAGNOSIS — R1013 Epigastric pain: Secondary | ICD-10-CM | POA: Insufficient documentation

## 2024-09-25 DIAGNOSIS — R110 Nausea: Secondary | ICD-10-CM | POA: Insufficient documentation

## 2024-09-25 NOTE — Progress Notes (Signed)
 Patient notified

## 2024-09-27 ENCOUNTER — Telehealth: Payer: Self-pay

## 2024-09-27 NOTE — Telephone Encounter (Signed)
 Patient notified

## 2024-09-27 NOTE — Telephone Encounter (Signed)
 The doctor that ordered the test

## 2024-09-28 ENCOUNTER — Telehealth: Payer: Self-pay

## 2024-09-28 ENCOUNTER — Other Ambulatory Visit: Payer: Self-pay | Admitting: Nurse Practitioner

## 2024-09-28 DIAGNOSIS — J9611 Chronic respiratory failure with hypoxia: Secondary | ICD-10-CM

## 2024-09-28 NOTE — Telephone Encounter (Signed)
 Spoke with [facility] they will deliver her oxygen  today and pt aware

## 2024-10-10 ENCOUNTER — Other Ambulatory Visit: Payer: Self-pay | Admitting: General Surgery

## 2024-10-10 ENCOUNTER — Telehealth: Payer: Self-pay | Admitting: Radiation Oncology

## 2024-10-10 DIAGNOSIS — R110 Nausea: Secondary | ICD-10-CM

## 2024-10-10 DIAGNOSIS — R1011 Right upper quadrant pain: Secondary | ICD-10-CM

## 2024-10-10 DIAGNOSIS — R1013 Epigastric pain: Secondary | ICD-10-CM

## 2024-10-10 NOTE — Telephone Encounter (Signed)
 Called pt to get CT r/s - pt states she is still having gallbladder issues - pt requested I call back in a month to try to r/s again - [facility]

## 2024-10-10 NOTE — Telephone Encounter (Signed)
 error

## 2024-10-19 ENCOUNTER — Other Ambulatory Visit

## 2024-10-20 ENCOUNTER — Other Ambulatory Visit

## 2024-10-23 ENCOUNTER — Other Ambulatory Visit

## 2024-10-24 ENCOUNTER — Ambulatory Visit: Admitting: Internal Medicine

## 2024-11-13 ENCOUNTER — Encounter: Admitting: Family

## 2025-04-24 ENCOUNTER — Ambulatory Visit: Admitting: Internal Medicine

## 2025-09-03 ENCOUNTER — Ambulatory Visit: Admitting: Nurse Practitioner
